# Patient Record
Sex: Female | Born: 1945 | Race: Black or African American | Hispanic: No | Marital: Married | Smoking: Former smoker
Health system: Southern US, Community
[De-identification: ages and names within clinical notes are randomized; demographics above are authoritative.]

## PROBLEM LIST (undated history)

## (undated) DIAGNOSIS — K5090 Crohn's disease, unspecified, without complications: Secondary | ICD-10-CM

## (undated) DIAGNOSIS — D631 Anemia in chronic kidney disease: Secondary | ICD-10-CM

## (undated) DIAGNOSIS — K529 Noninfective gastroenteritis and colitis, unspecified: Secondary | ICD-10-CM

## (undated) DIAGNOSIS — E890 Postprocedural hypothyroidism: Secondary | ICD-10-CM

## (undated) DIAGNOSIS — R030 Elevated blood-pressure reading, without diagnosis of hypertension: Secondary | ICD-10-CM

## (undated) DIAGNOSIS — R569 Unspecified convulsions: Secondary | ICD-10-CM

## (undated) DIAGNOSIS — Z8719 Personal history of other diseases of the digestive system: Secondary | ICD-10-CM

## (undated) DIAGNOSIS — Z8669 Personal history of other diseases of the nervous system and sense organs: Secondary | ICD-10-CM

## (undated) DIAGNOSIS — J439 Emphysema, unspecified: Secondary | ICD-10-CM

## (undated) DIAGNOSIS — I739 Peripheral vascular disease, unspecified: Secondary | ICD-10-CM

## (undated) DIAGNOSIS — K5010 Crohn's disease of large intestine without complications: Secondary | ICD-10-CM

## (undated) DIAGNOSIS — M1990 Unspecified osteoarthritis, unspecified site: Secondary | ICD-10-CM

## (undated) DIAGNOSIS — Z87442 Personal history of urinary calculi: Secondary | ICD-10-CM

## (undated) DIAGNOSIS — Z972 Presence of dental prosthetic device (complete) (partial): Secondary | ICD-10-CM

## (undated) DIAGNOSIS — I351 Nonrheumatic aortic (valve) insufficiency: Secondary | ICD-10-CM

## (undated) DIAGNOSIS — F419 Anxiety disorder, unspecified: Secondary | ICD-10-CM

## (undated) DIAGNOSIS — M810 Age-related osteoporosis without current pathological fracture: Secondary | ICD-10-CM

## (undated) DIAGNOSIS — I509 Heart failure, unspecified: Secondary | ICD-10-CM

## (undated) DIAGNOSIS — C801 Malignant (primary) neoplasm, unspecified: Secondary | ICD-10-CM

## (undated) DIAGNOSIS — IMO0002 Reserved for concepts with insufficient information to code with codable children: Secondary | ICD-10-CM

## (undated) DIAGNOSIS — N184 Chronic kidney disease, stage 4 (severe): Secondary | ICD-10-CM

## (undated) DIAGNOSIS — K219 Gastro-esophageal reflux disease without esophagitis: Secondary | ICD-10-CM

## (undated) DIAGNOSIS — I10 Essential (primary) hypertension: Secondary | ICD-10-CM

## (undated) DIAGNOSIS — Z9289 Personal history of other medical treatment: Secondary | ICD-10-CM

## (undated) DIAGNOSIS — K603 Anal fistula, unspecified: Secondary | ICD-10-CM

## (undated) DIAGNOSIS — N189 Chronic kidney disease, unspecified: Secondary | ICD-10-CM

## (undated) DIAGNOSIS — R0600 Dyspnea, unspecified: Secondary | ICD-10-CM

## (undated) DIAGNOSIS — M2550 Pain in unspecified joint: Secondary | ICD-10-CM

## (undated) DIAGNOSIS — I2699 Other pulmonary embolism without acute cor pulmonale: Secondary | ICD-10-CM

## (undated) DIAGNOSIS — I480 Paroxysmal atrial fibrillation: Secondary | ICD-10-CM

## (undated) HISTORY — DX: Nonrheumatic aortic (valve) insufficiency: I35.1

## (undated) HISTORY — DX: Age-related osteoporosis without current pathological fracture: M81.0

## (undated) HISTORY — PX: CHOLECYSTECTOMY: SHX55

## (undated) HISTORY — DX: Essential (primary) hypertension: I10

## (undated) HISTORY — DX: Emphysema, unspecified: J43.9

## (undated) HISTORY — PX: COLON RESECTION: SHX5231

## (undated) HISTORY — PX: EYE SURGERY: SHX253

## (undated) HISTORY — DX: Reserved for concepts with insufficient information to code with codable children: IMO0002

## (undated) HISTORY — PX: TUBAL LIGATION: SHX77

## (undated) HISTORY — PX: GLAUCOMA SURGERY: SHX656

---

## 1988-05-26 HISTORY — PX: ABDOMINAL HYSTERECTOMY: SHX81

## 1998-08-10 ENCOUNTER — Ambulatory Visit (HOSPITAL_COMMUNITY): Admission: RE | Admit: 1998-08-10 | Discharge: 1998-08-10 | Payer: Self-pay | Admitting: *Deleted

## 1998-09-18 ENCOUNTER — Ambulatory Visit (HOSPITAL_COMMUNITY): Admission: RE | Admit: 1998-09-18 | Discharge: 1998-09-18 | Payer: Self-pay | Admitting: Gastroenterology

## 1998-10-08 ENCOUNTER — Other Ambulatory Visit: Admission: RE | Admit: 1998-10-08 | Discharge: 1998-10-08 | Payer: Self-pay | Admitting: *Deleted

## 2000-01-30 ENCOUNTER — Ambulatory Visit (HOSPITAL_COMMUNITY): Admission: RE | Admit: 2000-01-30 | Discharge: 2000-01-30 | Payer: Self-pay | Admitting: Gastroenterology

## 2001-06-25 ENCOUNTER — Emergency Department (HOSPITAL_COMMUNITY): Admission: EM | Admit: 2001-06-25 | Discharge: 2001-06-25 | Payer: Self-pay

## 2001-07-09 ENCOUNTER — Encounter: Admission: RE | Admit: 2001-07-09 | Discharge: 2001-07-09 | Payer: Self-pay | Admitting: Gastroenterology

## 2001-07-09 ENCOUNTER — Encounter: Payer: Self-pay | Admitting: Gastroenterology

## 2003-06-06 ENCOUNTER — Other Ambulatory Visit: Admission: RE | Admit: 2003-06-06 | Discharge: 2003-06-06 | Payer: Self-pay | Admitting: Radiology

## 2003-07-18 ENCOUNTER — Ambulatory Visit (HOSPITAL_COMMUNITY): Admission: RE | Admit: 2003-07-18 | Discharge: 2003-07-18 | Payer: Self-pay | Admitting: Gastroenterology

## 2003-10-13 ENCOUNTER — Observation Stay (HOSPITAL_COMMUNITY): Admission: RE | Admit: 2003-10-13 | Discharge: 2003-10-14 | Payer: Self-pay | Admitting: Surgery

## 2003-10-13 ENCOUNTER — Encounter (INDEPENDENT_AMBULATORY_CARE_PROVIDER_SITE_OTHER): Payer: Self-pay | Admitting: Specialist

## 2003-10-13 HISTORY — PX: TOTAL THYROIDECTOMY: SHX2547

## 2005-02-28 ENCOUNTER — Emergency Department (HOSPITAL_COMMUNITY): Admission: EM | Admit: 2005-02-28 | Discharge: 2005-02-28 | Payer: Self-pay | Admitting: Emergency Medicine

## 2005-12-25 ENCOUNTER — Encounter: Admission: RE | Admit: 2005-12-25 | Discharge: 2005-12-25 | Payer: Self-pay | Admitting: Gastroenterology

## 2006-05-26 HISTORY — PX: OTHER SURGICAL HISTORY: SHX169

## 2006-07-05 ENCOUNTER — Encounter: Admission: RE | Admit: 2006-07-05 | Discharge: 2006-07-05 | Payer: Self-pay | Admitting: Gastroenterology

## 2007-03-13 ENCOUNTER — Inpatient Hospital Stay (HOSPITAL_COMMUNITY): Admission: EM | Admit: 2007-03-13 | Discharge: 2007-03-16 | Payer: Self-pay | Admitting: Emergency Medicine

## 2007-03-25 ENCOUNTER — Encounter: Admission: RE | Admit: 2007-03-25 | Discharge: 2007-03-25 | Payer: Self-pay | Admitting: Gastroenterology

## 2007-05-16 ENCOUNTER — Encounter: Admission: RE | Admit: 2007-05-16 | Discharge: 2007-05-16 | Payer: Self-pay | Admitting: Gastroenterology

## 2008-02-13 ENCOUNTER — Emergency Department (HOSPITAL_COMMUNITY): Admission: EM | Admit: 2008-02-13 | Discharge: 2008-02-13 | Payer: Self-pay | Admitting: Emergency Medicine

## 2008-08-09 ENCOUNTER — Encounter: Admission: RE | Admit: 2008-08-09 | Discharge: 2008-08-09 | Payer: Self-pay | Admitting: Gastroenterology

## 2008-08-15 ENCOUNTER — Encounter: Admission: RE | Admit: 2008-08-15 | Discharge: 2008-08-15 | Payer: Self-pay | Admitting: Gastroenterology

## 2008-10-10 ENCOUNTER — Emergency Department (HOSPITAL_COMMUNITY): Admission: EM | Admit: 2008-10-10 | Discharge: 2008-10-10 | Payer: Self-pay | Admitting: Emergency Medicine

## 2009-08-13 ENCOUNTER — Encounter: Admission: RE | Admit: 2009-08-13 | Discharge: 2009-08-13 | Payer: Self-pay | Admitting: Gastroenterology

## 2010-04-04 ENCOUNTER — Ambulatory Visit (HOSPITAL_COMMUNITY): Admission: RE | Admit: 2010-04-04 | Discharge: 2010-04-04 | Payer: Self-pay | Admitting: Gastroenterology

## 2010-10-08 NOTE — H&P (Signed)
NAMEMarland [name]  [name], [name] NO.:  [id number]   MEDICAL RECORD NO.:  [id number]          PATIENT TYPE:  INP   LOCATION:  1832                         FACILITY:  [facility]   PHYSICIAN:  Ashby Dawes. Polite, M.D. DATE OF BIRTH:  1946-03-06   DATE OF ADMISSION:  03/13/2007  DATE OF DISCHARGE:                              HISTORY & PHYSICAL   CHIEF COMPLAINT:  Nausea, vomiting, abdominal pain, and diarrhea.   HISTORY OF PRESENT ILLNESS:  This is a 65 year old female with known  history of hypertension, hypothyroidism, Crohn's disease, status post  multiple surgeries in 1978, 1987, and 1989, as well as glaucoma, chronic  prednisone use, who comes to the ED with the above chief complaint.  The  patient stated since Thursday she has been having chills, fever, poor  appetite, inability to keep anything down.  She has tried taking her  medicines, tried taking liquids, and was unable to keep it down, so M.D.  on Friday gave her antiemetics.  She was then able to keep the  antiemetic down.  She continued to have nausea, vomiting, and diarrhea.  She thinks she had about four bouts of diarrhea, no blood.  She denies  any sick contacts.  No recent antibiotics, no well water.  She has not  had a flare of her Crohn's in some time; however, when she has a flare  it typically presents like this.  In the ED she was evaluated.  The  patient was afebrile, hypotensive, blood pressure as low as 83/60, pulse  99, respiratory rate 20, saturating 100%.  The patient had labs, did not  have a white count.  In fact it was 8.6, hemoglobin 14, platelets 151.  BMET significant for hypokalemia at 2.7, creatinine 2.7.  UA appears to  be contaminated as there have been epithelial cells, 0-2 wbc's, few  bacteria, Hemoccult negative.  Carbon dioxide of 13.  Admission is  deemed necessary for further evaluation and treatment of hypotension,  metabolic acidosis, as well as acute renal failure.   PAST MEDICAL  HISTORY:  As stated above.   CURRENT MEDICATIONS:  1. Synthroid 112 mcg daily.  2. Benicar 20 mg daily.  3. Prednisone 5 mg daily.  4. Asacol six tablets daily.  5. Folic acid 10 mg daily.  6. Potassium 20 mEq daily.   SOCIAL HISTORY:  Negative for tobacco, alcohol, or drugs.   ALLERGIES:  None.   PAST SURGICAL HISTORY:  She states she had surgeries on her abdomen in  1978, 1987, and 1989.   FAMILY HISTORY:  Mother without any significant medical problems.  Father with heart trouble.  Four brothers, one deceased, unknown cause,  one with thyroid problems.   REVIEW OF SYSTEMS:  As stated in the HPI.   PHYSICAL EXAMINATION:  The patient was alert and oriented x3.  No  apparent distress.  VITAL SIGNS:  Temperature 98.7, blood pressure 89/57, pulse 100,  saturating 100%.  HEENT:  Slightly pale sclerae.  Moist oral mucosa.  No nose or JVD.  NECK:  No carotid bruit.  CHEST:  Clear without rales or rhonchi.  CARDIOVASCULAR:  Regular S1/S2.  ABDOMEN:  Slightly distended.  Vague tenderness with palpation.  No  hepatosplenomegaly appreciated.  EXTREMITIES:  No edema.  NEUROLOGIC:  Nonfocal.   ASSESSMENT:  1. Hypotension secondary to nausea, vomiting, and diarrhea.  2. Acute renal failure.  3. Hypokalemia.  4. Crohn's disease.  5. Chronic prednisone use.  6. History of hypertension.   RECOMMEND:  Recommend the patient be admitted to a telemetry floor bed.  The patient will be resuscitated with IV fluids.  Because of her history  of Crohn's she will be started on empiric antibiotics and stress  steroids.  Cortisol level will be obtained.  The patient will be pan  cultured.  Will replete potassium and make further recommendations after  review of the above studies.  Please note the patient did have abdominal  series that showed nonspecific bowel gas pattern with scattered air  fluid levels.  There is a short segment of one central bowel loop,  likely small bowel, that measures  4.7 cm and may be a focally dilated  small bowel loop.  No definite obstruction.      Ashby Dawes. Polite, M.D.  Electronically Signed     RDP/MEDQ  D:  03/13/2007  T:  03/13/2007  Job:  [id number]

## 2010-10-08 NOTE — Discharge Summary (Signed)
NAMEMarland [name]  ERNA, BROSSARD NO.:  [id number]   MEDICAL RECORD NO.:  [id number]          PATIENT TYPE:  INP   LOCATION:  1583                         FACILITY:  [facility]   PHYSICIAN:  Cletis Athens, M.D.   DATE OF BIRTH:  June 06, 1945   DATE OF ADMISSION:  03/13/2007  DATE OF DISCHARGE:  03/16/2007                               DISCHARGE SUMMARY   DISCHARGE DIAGNOSES:  1. Crohn's disease status post three resections of her terminal ileum      with a known recurrence and with an acute exacerbation presenting      as small-bowel obstruction.  2. Hypokalemia with dehydration.  3. Osteoporosis.  4. Goiter.  5. Hypertension, although that was not a problem since the patient was      hypotensive on this admission.   DISCHARGE MEDICATIONS:  1. The patient is having her Benicar 20 mg daily held.  2. She is on prednisone 40 mg daily which is a new higher dose.  3. Asacol 4 tablets twice a day.  4. Potassium 20 mEq 2 tablets twice a day which is a new higher dose.  5. Estrogen patch weekly.  6. Synthroid 0.112.  7. Actonel 35 mg weekly.   Her condition is improved.  Her diet is low residue. Her activity is up  ad lib, and her followup is early next week with me.   BRIEF HISTORY:  [name] developed gradual onset of nausea,  vomiting, chills, fever and abdominal pain over a period of several days  and was seen in the office by one of my partners and given an antiemetic  which did not work.  She presented to the emergency room Saturday night  with dehydration, hypotension and abdominal discomfort.  Physical exam  at that time revealed low blood pressure, abdomen with tenderness in the  right upper quadrant and diminished bowel sounds as well as otherwise  unremarkable heart, lung and extremity exam   LABORATORY DATA:  Initial potassium was 2.7 and remained low until we  were able to replete it, eventually rising to 3.9. BUN and creatinine  were also abnormal with BUN of  31, creatinine 2.76 and then corrected  back to entirely normal with IV fluids. Liver function was normal.  CBC  was normal and remained normal. Blood gas was normal, and urinalysis was  negative. Blood cultures were negative.  Fecal exam revealed no  abnormalities except for white cells and blood.   HOSPITAL COURSE:  CROHN'S DISEASE:  The patient underwent a two-way abdominal series  showing a sentinel loop in the center of the abdomen that was consistent  with partial small-bowel obstruction, but it was mild. As a result, she  was placed on IV fluids, IV Solu-Medrol and cultured and covered with  Cipro and Flagyl.  Within 24 hours, her abdominal findings had improved  considerably, and she was no longer having diarrhea, nausea or abdominal  pain.  KUB repeated showed improvement in the findings of partial small-  bowel obstruction.  She was switched to oral medications.  Her  antibiotics were stopped once the cultures came back  negative, and she  was placed on a low-residue diet which she tolerated without difficulty.  She is going to be discharged with the above medications.   Her other medical problems except for the hypokalemia were not  addressed. For hypotension, blood pressure medicine was stopped, and  this will be restarted as an outpatient hopefully next week.      Cletis Athens, M.D.  Electronically Signed     JW/MEDQ  D:  03/16/2007  T:  03/16/2007  Job:  [id number]

## 2010-10-11 NOTE — Op Note (Signed)
NAME:  Natasha [name], Natasha [name]                        ACCOUNT NO.:  [id number]   MEDICAL RECORD NO.:  [id number]                   PATIENT TYPE:  AMB   LOCATION:  DAY                                  FACILITY:  [facility]   PHYSICIAN:  Earnstine Regal, M.D.                DATE OF BIRTH:  1945/11/01   DATE OF PROCEDURE:  10/13/2003  DATE OF DISCHARGE:                                 OPERATIVE REPORT   PREOPERATIVE DIAGNOSIS:  Multinodular goiter with Hurthle cell change.   POSTOPERATIVE DIAGNOSIS:  Multinodular goiter with Hurthle cell change.   PROCEDURE:  Total thyroidectomy.   SURGEON:  Earnstine Regal, M.D.   ASSISTANT:  Lew Dawes. Rosana Hoes, M.D.   ANESTHESIA:  General.   ESTIMATED BLOOD LOSS:  Minimal.   PREPARATION:  Betadine.   COMPLICATIONS:  None.   INDICATIONS:  The patient is a pleasant 65 year old black female, presents  with multiple thyroid nodules.  These have been followed on physical exam by  Dr. Cletis Athens and previously by Dr. Irene Pap.  The patient  underwent fine needle aspiration in April 2005 of a solid nodule, measuring  2.8 cm in size.  Cytology demonstrated Hurthle cell change.  The patient has  multiple nodules in both the right and left lobes.  She now comes to surgery  for a total thyroidectomy.   DESCRIPTION OF PROCEDURE:  The procedure is done in OR #11 at the [facility].  The patient is brought to the operating room and  placed in a supine position on the operating room table.  Following the  administration of general anesthesia, the patient is prepped and draped in  the usual strict aseptic fashion.  After ascertaining that an adequate level  of anesthesia had been obtained, a Kocher incision is made with a #10 blade.  Dissection is carried down through subcutaneous tissues and platysma.  Hemostasis is obtained with the electrocautery.  Skin flaps are developed  cephalad and caudad from the thyroid notch to the sternal notch.   A Mahorner  self-retaining retractor is placed for exposure.  Strap muscles are incised  in the midline, and dissection is carried down to the thyroid gland.  There  are multiple moderate-sized nodules within the thyroid isthmus.  There is a  small pyramidal lobe.  Dissection is begun on the left side of the neck.  Strap muscles are reflected laterally.  Middle thyroid vein is divided  between small Ligaclips.  Gland is rolled anteriorly, and adventitial tissue  is bluntly dissected out with a Art therapist.  Superior pole is  mobilized.  Superior pole vessels are ligated in continuity with 2-0 silk  ties and medium Ligaclips and divided.  Inferior venous tributaries are  divided between small Ligaclips.  Gland is rolled further anteriorly.  Recurrent nerve is identified and preserved.  Branches of the inferior  thyroid artery are divided between small  Ligaclips.  Ligament of Gwenlyn Found is  transected with the electrocautery, and the gland is rolled up and onto the  anterior surface of the trachea.  A dry pack is placed in the left neck.  Pyramidal lobe is dissected out with the electrocautery and included with  the specimen.   Next, we turned our attention to the right thyroid lobe.  Again, strap  muscles were reflected laterally.  Middle thyroid vein was divided between  small Ligaclips.  The superior pole was dissected out, ligated in continuity  with 2-0 silk ties and medium Ligaclips, and divided.  Gland was rolled  anteriorly.  Branches of the inferior thyroid artery are divided between  small Ligaclips.  Inferior venous tributaries are divided between medium  Ligaclips.  Care is taken to preserve parathyroid tissue.  Ligament of Gwenlyn Found  is transected with the electrocautery, and the gland is excised off the  anterior trachea.  Good hemostasis is noted on both sides of the neck.  Neck  is irrigated with warm saline, and Surgicel is placed over the area of the  recurrent laryngeal  nerves.  A suture is used to mark the right superior  pole of the thyroid gland.  It is submitted to pathology for permanent  review.  Good hemostasis is noted on each side of the neck, and packs are  removed.  Strap muscles are reapproximated in the midline with interrupted 3-  0 Vicryl sutures.  Platysma is closed with interrupted 3-0 Vicryl sutures.  Skin edges are reapproximated with a running 4-0 Vicryl subcuticular suture.  The wound is washed and dried, and Benzoin and Steri-Strips area applied.  Sterile gauze dressings are applied.  The patient is awakened from  anesthesia and brought to the recovery room in stable condition.  The  patient tolerated the procedure well.                                               Earnstine Regal, M.D.    TMG/MEDQ  D:  10/13/2003  T:  10/13/2003  Job:  [id number]   cc:   Cletis Athens, M.D.  [place]  [address]  [place]  [place] [address]  Fax: [phone]   Jacelyn Pi, M.D.  [phone], [place] [address]  Fax: [phone]

## 2010-12-03 ENCOUNTER — Inpatient Hospital Stay (HOSPITAL_COMMUNITY)
Admission: EM | Admit: 2010-12-03 | Discharge: 2010-12-06 | DRG: 386 | Disposition: A | Discharge status: Home / Self Care | Payer: Medicare Other | Attending: Gastroenterology | Admitting: Gastroenterology

## 2010-12-03 ENCOUNTER — Emergency Department (HOSPITAL_COMMUNITY): Payer: Medicare Other

## 2010-12-03 DIAGNOSIS — IMO0002 Reserved for concepts with insufficient information to code with codable children: Secondary | ICD-10-CM

## 2010-12-03 DIAGNOSIS — E876 Hypokalemia: Secondary | ICD-10-CM | POA: Diagnosis present

## 2010-12-03 DIAGNOSIS — Z79899 Other long term (current) drug therapy: Secondary | ICD-10-CM

## 2010-12-03 DIAGNOSIS — K56609 Unspecified intestinal obstruction, unspecified as to partial versus complete obstruction: Secondary | ICD-10-CM | POA: Diagnosis present

## 2010-12-03 DIAGNOSIS — M810 Age-related osteoporosis without current pathological fracture: Secondary | ICD-10-CM | POA: Diagnosis present

## 2010-12-03 DIAGNOSIS — N183 Chronic kidney disease, stage 3 unspecified: Secondary | ICD-10-CM | POA: Diagnosis present

## 2010-12-03 DIAGNOSIS — E890 Postprocedural hypothyroidism: Secondary | ICD-10-CM | POA: Diagnosis present

## 2010-12-03 DIAGNOSIS — K5000 Crohn's disease of small intestine without complications: Principal | ICD-10-CM | POA: Diagnosis present

## 2010-12-03 DIAGNOSIS — M51379 Other intervertebral disc degeneration, lumbosacral region without mention of lumbar back pain or lower extremity pain: Secondary | ICD-10-CM | POA: Diagnosis present

## 2010-12-03 DIAGNOSIS — Z87891 Personal history of nicotine dependence: Secondary | ICD-10-CM

## 2010-12-03 DIAGNOSIS — M5137 Other intervertebral disc degeneration, lumbosacral region: Secondary | ICD-10-CM | POA: Diagnosis present

## 2010-12-03 DIAGNOSIS — M5030 Other cervical disc degeneration, unspecified cervical region: Secondary | ICD-10-CM | POA: Diagnosis present

## 2010-12-03 DIAGNOSIS — H409 Unspecified glaucoma: Secondary | ICD-10-CM | POA: Diagnosis present

## 2010-12-03 DIAGNOSIS — I129 Hypertensive chronic kidney disease with stage 1 through stage 4 chronic kidney disease, or unspecified chronic kidney disease: Secondary | ICD-10-CM | POA: Diagnosis present

## 2010-12-03 LAB — COMPREHENSIVE METABOLIC PANEL
AST: 13 U/L (ref 0–37)
BUN: 41 mg/dL — ABNORMAL HIGH (ref 6–23)
CO2: 16 mEq/L — ABNORMAL LOW (ref 19–32)
Chloride: 115 mEq/L — ABNORMAL HIGH (ref 96–112)
GFR calc Af Amer: 25 mL/min — ABNORMAL LOW (ref >60)
GFR calc non Af Amer: 21 mL/min — ABNORMAL LOW (ref >60)
Potassium: 2.9 mEq/L — ABNORMAL LOW (ref 3.5–5.1)
Total Bilirubin: 0.4 mg/dL (ref 0.3–1.2)
Total Protein: 6.6 g/dL (ref 6.0–8.3)

## 2010-12-03 LAB — DIFFERENTIAL
Basophils Absolute: 0 10*3/uL (ref 0.0–0.1)
Eosinophils Relative: 2 % (ref 0–5)
Lymphocytes Relative: 34 % (ref 12–46)
Monocytes Absolute: 0.6 10*3/uL (ref 0.1–1.0)
Monocytes Relative: 11 % (ref 3–12)
Neutro Abs: 3.1 10*3/uL (ref 1.7–7.7)
Neutrophils Relative %: 53 % (ref 43–77)

## 2010-12-03 LAB — CBC
HCT: 41 % (ref 36.0–46.0)
MCV: 92.6 fL (ref 78.0–100.0)
Platelets: 120 10*3/uL — ABNORMAL LOW (ref 150–400)
RBC: 4.43 MIL/uL (ref 3.87–5.11)
RDW: 14 % (ref 11.5–15.5)
WBC: 5.8 10*3/uL (ref 4.0–10.5)

## 2010-12-03 LAB — URINALYSIS, ROUTINE W REFLEX MICROSCOPIC
Bilirubin Urine: NEGATIVE
Glucose, UA: NEGATIVE mg/dL
Ketones, ur: NEGATIVE mg/dL
Nitrite: NEGATIVE
Specific Gravity, Urine: 1.017 (ref 1.005–1.030)
Urobilinogen, UA: 0.2 mg/dL (ref 0.0–1.0)

## 2010-12-03 LAB — LIPASE, BLOOD: Lipase: 121 U/L — ABNORMAL HIGH (ref 11–59)

## 2010-12-03 LAB — URINE MICROSCOPIC-ADD ON

## 2010-12-04 LAB — URINE CULTURE
Colony Count: NO GROWTH
Culture: NO GROWTH

## 2010-12-05 LAB — BASIC METABOLIC PANEL
BUN: 40 mg/dL — ABNORMAL HIGH (ref 6–23)
Calcium: 8.9 mg/dL (ref 8.4–10.5)
GFR calc Af Amer: 23 mL/min — ABNORMAL LOW (ref >60)
GFR calc non Af Amer: 19 mL/min — ABNORMAL LOW (ref >60)
Potassium: 3.8 mEq/L (ref 3.5–5.1)
Sodium: 138 mEq/L (ref 135–145)

## 2010-12-05 LAB — DIFFERENTIAL
Basophils Relative: 0 % (ref 0–1)
Eosinophils Absolute: 0 10*3/uL (ref 0.0–0.7)
Eosinophils Relative: 0 % (ref 0–5)
Lymphocytes Relative: 9 % — ABNORMAL LOW (ref 12–46)
Lymphs Abs: 0.6 10*3/uL — ABNORMAL LOW (ref 0.7–4.0)
Monocytes Relative: 2 % — ABNORMAL LOW (ref 3–12)
Neutrophils Relative %: 89 % — ABNORMAL HIGH (ref 43–77)

## 2010-12-05 LAB — CBC
HCT: 38.3 % (ref 36.0–46.0)
Hemoglobin: 12.8 g/dL (ref 12.0–15.0)
MCH: 31.4 pg (ref 26.0–34.0)
MCHC: 33.4 g/dL (ref 30.0–36.0)
MCV: 93.9 fL (ref 78.0–100.0)
Platelets: 118 10*3/uL — ABNORMAL LOW (ref 150–400)
RBC: 4.08 MIL/uL (ref 3.87–5.11)
WBC: 7.2 10*3/uL (ref 4.0–10.5)

## 2010-12-05 LAB — TSH: TSH: 0.041 u[IU]/mL — ABNORMAL LOW (ref 0.350–4.500)

## 2010-12-05 LAB — T4, FREE: Free T4: 1.54 ng/dL (ref 0.80–1.80)

## 2010-12-05 NOTE — H&P (Signed)
NAMEMarland Kitchen  OMAYA, NIELAND NO.:  [id number]  MEDICAL RECORD NO.:  [id number]  LOCATION:  5409                         FACILITY:  [facility]  PHYSICIAN:  Earle Gell, M.D.   DATE OF BIRTH:  1945-10-10  DATE OF ADMISSION:  12/03/2010 DATE OF DISCHARGE:                             HISTORY & PHYSICAL   ADMISSION DIAGNOSES: 1. Partial small-bowel obstruction and hypokalemia. 2. Chronic Crohn ileitis.  HISTORY:  [name] is a 65 year old female born Jul 27, 1945.  The patient has chronic Crohn ileitis.  She has undergone three ileal resections (total 60 cm small bowel removed) and a right colon resection in the past due to Crohn ileitis.  On April 04, 2010, proctocolonoscopy to the ileocolonic surgical anastomosis showed a normal colon and 4 anastomotic aphthous ulcers in the small bowel.  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 On May 16, 2007, she underwent an MRCP to evaluate chronic elevation in her serum amylase and lipase; MRCP showed probable pancreas divisum.  The patient has not felt well since Saturday, November 30, 2010.  She has had intermittent generalized abdominal pain, nausea without vomiting, nonbloody diarrhea, generalized fatigue, anorexia, and a poor taste.  The patient reported to [facility] emergency room this morning.  Her urinalysis showed proteinuria.  Serum lipase was slightly elevated at 121.  Complete metabolic profile was abnormal for a serum potassium 2.9, serum creatinine 2.34, alkaline phosphatase 174, and albumin 3.1.  White blood cell count was 5800, hemoglobin was 14.4 g, platelet count was low at 120,000.  Acute abdominal x-ray series showed a few dilated small bowel loops consistent with a partial small-bowel obstruction.  The patient chronically takes prednisone 5 mg daily.  She has degenerative disk disease involving the lumbar and cervical spine.  She takes Vicodin 5/500 mg  every 6 hours as needed.  She has chronic hypothyroidism post thyroidectomy for a large thyroid cyst.  The patient will be admitted and treated for a partial small-bowel obstruction.  She will receive intravenous Solu-Medrol and remain on a clear liquid diet.  I will screen her stool for C difficile toxin.  MEDICATION ALLERGIES:  None.  CHRONIC MEDICATIONS: 1. Vicodin 5/500 mg 1 tablet every 6 hours as needed. 2. Betimol ophthalmic suspension 1 drop in both eyes daily. 3. Vitamin B12 1000 mcg intramuscularly each month. 4. Folic acid 1 mg by mouth daily. 5. Asacol 4 tablets (1600 mg) daily. 6. Potassium chloride 20 mEq three times daily. 7. Synthroid 125 mcg daily. 8. Prednisone 5 mg daily.  PAST MEDICAL HISTORY: 1. Stage III kidney disease with proteinuria. 2. Primary hypothyroidism post thyroidectomy for thyroid cyst. 3. Hypertension. 4. Vitamin B12 deficiency. 5. 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 6. Chronic Crohn ileitis.  Three ileal resections and a right colon     resection between 1978 and 1989. 7. Osteoporosis. 8. Chronic respiratory allergies. 9. Glaucoma. 10.Lumbar and cervical degenerative disk disease. 11.Atypical chest pain. 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 13.Total abdominal hysterectomy with BSO. 14.Cholecystectomy. 15.Thyroidectomy.  HABITS:  The patient quit smoking cigarettes in 2003.  She continues to consume alcohol in moderation.  FAMILY HISTORY:  Brother diagnosed with colon cancer under the age  of 60.  IMMUNIZATIONS:  Pneumovax given in 2008, tetanus toxoid booster given in 2008, zoster vaccine given in 2008.  PHYSICAL EXAMINATION:  GENERAL:  The patient is alert and lying comfortably in her stretcher in the emergency department.  She reports no pain. HEENT:  Sclera nonicteric.  Mouth and throat appeared normal. LUNGS:  Clear to auscultation. CARDIAC:  A regular rhythm without audible murmurs. ABDOMEN:  Slightly distended with hypoactive bowel  sounds.  There is no tenderness to palpation in all quadrants. EXTREMITIES:  No edema.  ASSESSMENT: 1. Partial small-bowel obstruction by acute abdominal x-ray series. 2. Chronic Crohn ileitis. 3. Chronic prednisone use. 4. Chronic kidney disease with proteinuria and hypokalemia.          ______________________________ Earle Gell, M.D.     MJ/MEDQ  D:  12/03/2010  T:  12/04/2010  Job:  [id number]  cc:   Windy Kalata, M.D.  Electronically Signed by Earle Gell M.D. on 12/05/2010 03:56:25 PM

## 2010-12-08 NOTE — Discharge Summary (Signed)
NAMEMarland [name]  JOSEPHENE, MARRONE NO.:  [id number]  MEDICAL RECORD NO.:  [id number]  LOCATION:  1093                         FACILITY:  [facility]  PHYSICIAN:  Earle Gell, M.D.   DATE OF BIRTH:  Mar 14, 1946  DATE OF ADMISSION:  12/03/2010 DATE OF DISCHARGE:  12/06/2010                              DISCHARGE SUMMARY   DISCHARGE DIAGNOSIS:  Chronic Crohn ileitis with partial small bowel obstruction.  LABORATORY DATA:  Free T4 normal at 1.54 ng/dL.  Thyroid-stimulating hormone level 0.041 micro IU/mL.  Discharge white blood cell count 7200, discharge hemoglobin 12.8 g, discharge platelet count 118,000 on subcutaneous Lovenox.  Serum magnesium 1.6 mg/dL, discharge potassium 3.8, discharge creatinine 2.51.  Urine culture negative.  Stool screen for C difficile toxin negative.  Serum lipase slightly elevated 121. Admission complete metabolic profile was abnormal for serum potassium 2.9, serum creatinine 2.34, serum albumin 3.1.  Admission white blood cell count 5800, admission hemoglobin 14.4 g, admission platelet count 120,000.  Acute abdominal x-ray series on admission showed nonspecific bowel gas pattern with several dilated loops of small bowel which may indicate partial small bowel obstruction.  DISCHARGE MEDICATIONS: 1. Prednisone 40 mg each morning for 1 week, 35 mg each morning for 1     week, 30 mg each morning for 1 week, 25 mg each morning for 1 week,     20 mg each morning for 1 week, 15 mg each morning for 1 week, 10 mg     each morning week for 1 week, and finally 5 mg daily. 2. Flexeril 10 mg daily as needed. 3. Actonel 35 mg weekly. 4. Vitamin B12 - 1000 mcg intramuscularly each month. 5. Asacol 1600 mg each morning. 6. Potassium chloride 20 mEq three times daily. 7. Folic acid 1 mg daily. 8. Vicodin 5/500 one tablet every 6 hours as needed. 9. Synthroid 125 mcg daily. 10.Betimol ophthalmic suspension 1 drop in each eye daily.  OFFICE FOLLOWUP:  I will  plan to see Ms. Oesterle in the office in 1 month.  HOSPITAL COURSE:  Natasha [name] is a 65 year old female, born on 1945/11/03.  The patient has chronic Crohn ileitis.  She has undergone three ileal resections (a total of 60 cm ileum removed) and right colon resection in the past.  On April 04, 2010, colonoscopy to the ileal colonic surgical anastomosis showed a normal colon and for anastomotic aphthous ulcers in the distal ileum.  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 On May 16, 2007, MRCP showed pancreas divisum associated with chronic serum lipase and amylase elevation.  The patient was admitted to the hospital with intermittent generalized abdominal pain associated with nausea but no vomiting, nonbloody diarrhea, generalized fatigue, anorexia.  Acute abdominal x-ray series was consistent with a partial small bowel obstruction.  The patient was admitted to the hospital on a clear liquid diet and intravenous Solu-Medrol.  She was rapidly advanced from a clear liquid diet to a regular diet and switched from intravenous Solu-Medrol to oral prednisone.  She had no further bouts of abdominal pain.  Her diarrhea resolved.  Her abdominal pain resolved.  Screen for C difficile toxin was negative.  I presume the patient developed partial small bowel obstruction as a result of an exacerbation in her chronic Crohn ileitis.  She chronically takes prednisone 5 mg daily.  She will be discharged on her chronic medications.  Her dose of prednisone has been increased to 40 mg each morning and will be slowly tapered over the next few weeks to her baseline prednisone 5 mg daily dose.  The patient was due for a vitamin B12 injection and received that on the day of her discharge.  The patient is discharged in stable medical condition to home.          ______________________________ Earle Gell, M.D.     MJ/MEDQ  D:  12/06/2010  T:   12/06/2010  Job:  [id number]  Electronically Signed by Earle Gell M.D. on 12/08/2010 02:54:16 PM

## 2011-01-08 ENCOUNTER — Other Ambulatory Visit: Payer: Self-pay | Admitting: Gastroenterology

## 2011-01-09 ENCOUNTER — Ambulatory Visit
Admission: RE | Admit: 2011-01-09 | Discharge: 2011-01-09 | Disposition: A | Discharge status: Home / Self Care | Payer: Medicare Other | Source: Ambulatory Visit | Attending: Gastroenterology | Admitting: Gastroenterology

## 2011-01-23 ENCOUNTER — Ambulatory Visit (HOSPITAL_COMMUNITY)
Admission: RE | Admit: 2011-01-23 | Discharge: 2011-01-23 | Disposition: A | Discharge status: Home / Self Care | Payer: Medicare Other | Source: Ambulatory Visit | Attending: Gastroenterology | Admitting: Gastroenterology

## 2011-01-23 DIAGNOSIS — K219 Gastro-esophageal reflux disease without esophagitis: Secondary | ICD-10-CM | POA: Insufficient documentation

## 2011-01-23 DIAGNOSIS — I10 Essential (primary) hypertension: Secondary | ICD-10-CM | POA: Insufficient documentation

## 2011-01-23 DIAGNOSIS — K5090 Crohn's disease, unspecified, without complications: Secondary | ICD-10-CM | POA: Insufficient documentation

## 2011-01-23 DIAGNOSIS — E039 Hypothyroidism, unspecified: Secondary | ICD-10-CM | POA: Insufficient documentation

## 2011-01-23 DIAGNOSIS — R1310 Dysphagia, unspecified: Secondary | ICD-10-CM | POA: Insufficient documentation

## 2011-01-23 DIAGNOSIS — Z79899 Other long term (current) drug therapy: Secondary | ICD-10-CM | POA: Insufficient documentation

## 2011-02-02 NOTE — Op Note (Signed)
  NAME:  CYNDRA, FEINBERG NO.:  [id number]  MEDICAL RECORD NO.:  [id number]  LOCATION:  WLEN                         FACILITY:  [facility]  PHYSICIAN:  Earle Gell, M.D.   DATE OF BIRTH:  [birth year]  DATE OF PROCEDURE: DATE OF DISCHARGE:                              OPERATIVE REPORT   PROCEDURE:  Esophagogastroduodenoscopy.  HISTORY:  Ms. Aysiah Jurado. Montijo is a 65 year old female who chronically takes oral potassium and Actonel.  She is experiencing esophageal regurgitation with intermittent esophageal dysphagia without odynophagia.  She takes Nexium 40 mg daily.  Her barium esophagram with barium tablet was normal.  ENDOSCOPIST:  Earle Gell, M.D.  PREMEDICATION: 1. Versed 5 mg. 2. Fentanyl 50 mcg.  DESCRIPTION OF THE PROCEDURE:  The patient was placed in the left lateral decubitus position.  The Pentax gastroscope was passed through the posterior hypopharynx into the proximal esophagus without difficulty.  The hypopharynx, larynx, and vocal cords appeared normal.  Esophagoscopy:  The proximal mid and lower segments of the esophageal mucosa appeared normal.  The squamocolumnar junction was noted at 40 cm from the incisor teeth.  There was no endoscopic evidence for the presence of erosive esophagitis or Barrett esophagus.  Gastroscopy:  Retroflex view of the gastric cardia and fundus was normal.  The gastric body, antrum, and pylorus appeared normal.  Duodenoscopy:  The duodenal bulb and descending duodenum appeared normal.  ASSESSMENT:  Normal esophagogastroduodenoscopy.          ______________________________ Earle Gell, M.D.     MJ/MEDQ  D:  01/23/2011  T:  01/23/2011  Job:  [id number]  Electronically Signed by Earle Gell M.D. on 02/02/2011 09:13:15 AM

## 2011-02-24 LAB — COMPREHENSIVE METABOLIC PANEL
AST: 32
Albumin: 3.3 — ABNORMAL LOW
Alkaline Phosphatase: 134 — ABNORMAL HIGH
BUN: 35 — ABNORMAL HIGH
CO2: 22
Calcium: 8.4
Glucose, Bld: 75
Potassium: 3.7
Sodium: 144
Total Protein: 6.1

## 2011-02-24 LAB — DIFFERENTIAL
Lymphs Abs: 0.7
Monocytes Absolute: 0.2
Monocytes Relative: 1 — ABNORMAL LOW
Neutro Abs: 13.1 — ABNORMAL HIGH
Neutrophils Relative %: 93 — ABNORMAL HIGH

## 2011-02-24 LAB — URINALYSIS, ROUTINE W REFLEX MICROSCOPIC
Glucose, UA: NEGATIVE
Leukocytes, UA: NEGATIVE
Nitrite: NEGATIVE
Protein, ur: NEGATIVE
Specific Gravity, Urine: 1.013
pH: 5.5

## 2011-02-24 LAB — CBC
HCT: 40.9
Hemoglobin: 13.6
MCHC: 33.1
Platelets: 145 — ABNORMAL LOW
RBC: 4.19
RDW: 14
WBC: 14.1 — ABNORMAL HIGH

## 2011-02-24 LAB — URINE MICROSCOPIC-ADD ON

## 2011-03-05 LAB — GIARDIA/CRYPTOSPORIDIUM SCREEN(EIA)
Cryptosporidium Screen (EIA): NEGATIVE
Giardia Screen - EIA: NEGATIVE

## 2011-03-05 LAB — BASIC METABOLIC PANEL WITH GFR
BUN: 13
CO2: 12 — ABNORMAL LOW
Calcium: 8.4
Chloride: 117 — ABNORMAL HIGH
Chloride: 127 — ABNORMAL HIGH
Creatinine, Ser: 2.55 — ABNORMAL HIGH
GFR calc Af Amer: 25 — ABNORMAL LOW
GFR calc non Af Amer: 19 — ABNORMAL LOW
Glucose, Bld: 134 — ABNORMAL HIGH
Potassium: 3.1 — ABNORMAL LOW
Potassium: 3.9
Sodium: 141
Sodium: 144

## 2011-03-05 LAB — I-STAT 8, (EC8 V) (CONVERTED LAB)
Acid-base deficit: 16 — ABNORMAL HIGH
BUN: 31 — ABNORMAL HIGH
Chloride: 118 — ABNORMAL HIGH
Glucose, Bld: 106 — ABNORMAL HIGH
Potassium: 2.9 — ABNORMAL LOW
TCO2: 12
pCO2, Ven: 29.2 — ABNORMAL LOW
pH, Ven: 7.192 — CL

## 2011-03-05 LAB — URINE MICROSCOPIC-ADD ON

## 2011-03-05 LAB — FECAL LACTOFERRIN, QUANT

## 2011-03-05 LAB — COMPREHENSIVE METABOLIC PANEL
AST: 24
Albumin: 3.4 — ABNORMAL LOW
Alkaline Phosphatase: 116
Alkaline Phosphatase: 91
BUN: 27 — ABNORMAL HIGH
BUN: 31 — ABNORMAL HIGH
Creatinine, Ser: 2.47 — ABNORMAL HIGH
GFR calc Af Amer: 21 — ABNORMAL LOW
Glucose, Bld: 89
Potassium: 2.7 — CL
Potassium: 3.2 — ABNORMAL LOW
Total Bilirubin: 0.6
Total Protein: 5.4 — ABNORMAL LOW
Total Protein: 7.2

## 2011-03-05 LAB — MAGNESIUM: Magnesium: 1.2 — ABNORMAL LOW

## 2011-03-05 LAB — BASIC METABOLIC PANEL
BUN: 17
BUN: 30 — ABNORMAL HIGH
CO2: 12 — ABNORMAL LOW
CO2: 13 — ABNORMAL LOW
Calcium: 7.7 — ABNORMAL LOW
Calcium: 8.2 — ABNORMAL LOW
Chloride: 124 — ABNORMAL HIGH
Creatinine, Ser: 2.37 — ABNORMAL HIGH
Creatinine, Ser: 2.48 — ABNORMAL HIGH
GFR calc Af Amer: 24 — ABNORMAL LOW
GFR calc non Af Amer: 21 — ABNORMAL LOW
Glucose, Bld: 103 — ABNORMAL HIGH
Sodium: 143

## 2011-03-05 LAB — CBC
HCT: 34.4 — ABNORMAL LOW
HCT: 43
Hemoglobin: 11.6 — ABNORMAL LOW
MCHC: 33.6
Platelets: 151
RBC: 3.59 — ABNORMAL LOW
RDW: 14.6 — ABNORMAL HIGH
RDW: 14.9 — ABNORMAL HIGH
WBC: 8.6

## 2011-03-05 LAB — DIFFERENTIAL
Basophils Absolute: 0
Basophils Relative: 0
Eosinophils Absolute: 0.1
Eosinophils Relative: 1
Lymphocytes Relative: 18
Monocytes Absolute: 0.9 — ABNORMAL HIGH
Monocytes Relative: 11
Neutro Abs: 6

## 2011-03-05 LAB — CLOSTRIDIUM DIFFICILE EIA

## 2011-03-05 LAB — OCCULT BLOOD X 1 CARD TO LAB, STOOL: Fecal Occult Bld: NEGATIVE

## 2011-03-05 LAB — URINALYSIS, ROUTINE W REFLEX MICROSCOPIC
Glucose, UA: NEGATIVE
Leukocytes, UA: NEGATIVE
Specific Gravity, Urine: 1.015

## 2011-03-05 LAB — POTASSIUM: Potassium: 3.2 — ABNORMAL LOW

## 2011-03-05 LAB — CORTISOL: Cortisol, Plasma: 12.6

## 2011-03-05 LAB — LACTIC ACID, PLASMA: Lactic Acid, Venous: 1.6

## 2012-03-03 NOTE — H&P (Signed)
  Problems: Epigastric pain. Melenic-appearing stool. Crohn's ileitis. Pancreas divisum. Anorexia. Unintentional weight loss. Diarrhea.  History: The patient is a 66 year old female born [birth year]. 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 In August 2012, the patient underwent a normal esophagogastroduodenoscopy. In November 2011, the patient underwent a normal colonoscopy to the ileo--right colonic surgical anastomosis except for the presence of 4 small aphthous ulcers in the distal ileum.  A few weeks ago, the patient was diagnosed with acute bronchitis and placed on a course of erythromycin and tapering prednisone therapy. Her respiratory symptoms resolved.  For approximately 2 weeks, the patient has experienced postprandial epigastric discomfort, anorexia, postprandial nausea without vomiting, and postprandial watery, nonbloody diarrhea. The patient has experienced unintentional weight loss due to her poor appetite. She had an episode of passing melenic appearing stool which resolved. There is no past history of peptic ulcer disease. She does take omeprazole on a daily basis. 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  The patient is scheduled to undergo a diagnostic esophagogastroduodenoscopy and flexible proctosigmoidoscopy.  Chronic medication: Actonel. Prednisone. Asacol. Flexeril. Calcium. Folic acid. Betimol eyedrops. Omeprazole. Synthroid. Parenteral vitamin B12.  Past medical and surgical history: Crohn's ileitis.  Three  terminal ileal resections and right colon resection. Osteoporosis. Prominent thyroid cyst. Hypertension. Plantar warts. Chronic respiratory allergies. Pancreas divisum. Postmenopausal. Glaucoma. Brother diagnosed with colon cancer under age 62. Lumbar disc disease. Atypical chest pain. 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  Distal ileum and right colon resection. Appendectomy. Total abdominal hysterectomy. Cholecystectomy. Thyroidectomy.  Medication allergies: Mercaptopurine caused pancreatitis. Remicade caused  joint pains.  Habits: The patient is a former cigarette smoker and quit smoking cigarettes in 2003. She consumes alcohol in moderation.  Plan: Proceed with diagnostic esophagogastroduodenoscopy and flexible proctosigmoidoscopy.

## 2012-03-04 ENCOUNTER — Encounter (HOSPITAL_COMMUNITY)
Admission: RE | Disposition: A | Discharge status: Home / Self Care | Payer: Self-pay | Source: Ambulatory Visit | Attending: Gastroenterology

## 2012-03-04 ENCOUNTER — Encounter (HOSPITAL_COMMUNITY): Payer: Self-pay

## 2012-03-04 ENCOUNTER — Ambulatory Visit (HOSPITAL_COMMUNITY)
Admission: RE | Admit: 2012-03-04 | Discharge: 2012-03-04 | Disposition: A | Discharge status: Home / Self Care | Payer: Medicare Other | Source: Ambulatory Visit | Attending: Gastroenterology | Admitting: Gastroenterology

## 2012-03-04 DIAGNOSIS — K5000 Crohn's disease of small intestine without complications: Secondary | ICD-10-CM | POA: Insufficient documentation

## 2012-03-04 DIAGNOSIS — E876 Hypokalemia: Secondary | ICD-10-CM

## 2012-03-04 DIAGNOSIS — I129 Hypertensive chronic kidney disease with stage 1 through stage 4 chronic kidney disease, or unspecified chronic kidney disease: Secondary | ICD-10-CM | POA: Insufficient documentation

## 2012-03-04 DIAGNOSIS — K269 Duodenal ulcer, unspecified as acute or chronic, without hemorrhage or perforation: Secondary | ICD-10-CM | POA: Insufficient documentation

## 2012-03-04 DIAGNOSIS — Z79899 Other long term (current) drug therapy: Secondary | ICD-10-CM | POA: Insufficient documentation

## 2012-03-04 DIAGNOSIS — N184 Chronic kidney disease, stage 4 (severe): Secondary | ICD-10-CM | POA: Insufficient documentation

## 2012-03-04 HISTORY — DX: Gastro-esophageal reflux disease without esophagitis: K21.9

## 2012-03-04 HISTORY — PX: FLEXIBLE SIGMOIDOSCOPY: SHX5431

## 2012-03-04 HISTORY — PX: ESOPHAGOGASTRODUODENOSCOPY: SHX5428

## 2012-03-04 SURGERY — EGD (ESOPHAGOGASTRODUODENOSCOPY)
Anesthesia: Moderate Sedation

## 2012-03-04 MED ORDER — FENTANYL CITRATE 0.05 MG/ML IJ SOLN
INTRAMUSCULAR | Status: DC | PRN
  Administered 2012-03-04 (×2): 25 ug via INTRAVENOUS

## 2012-03-04 MED ORDER — SODIUM CHLORIDE 0.9 % IV SOLN
INTRAVENOUS | Status: DC
  Administered 2012-03-04: 500 mL via INTRAVENOUS

## 2012-03-04 MED ORDER — BUTAMBEN-TETRACAINE-BENZOCAINE 2-2-14 % EX AERO
INHALATION_SPRAY | CUTANEOUS | Status: DC | PRN
  Administered 2012-03-04: 2 via TOPICAL

## 2012-03-04 MED ORDER — MIDAZOLAM HCL 10 MG/2ML IJ SOLN
INTRAMUSCULAR | Status: AC
  Filled 2012-03-04: qty 4

## 2012-03-04 MED ORDER — MIDAZOLAM HCL 10 MG/2ML IJ SOLN
INTRAMUSCULAR | Status: DC | PRN
  Administered 2012-03-04: 1 mg via INTRAVENOUS
  Administered 2012-03-04 (×2): 2 mg via INTRAVENOUS

## 2012-03-04 MED ORDER — FENTANYL CITRATE 0.05 MG/ML IJ SOLN
INTRAMUSCULAR | Status: AC
  Filled 2012-03-04: qty 4

## 2012-03-04 NOTE — Op Note (Signed)
Procedure: Diagnostic esophagogastroduodenoscopy.  Endoscopist: Earle Gell  Premedication: Versed 5 mg intravenously. Fentanyl 50 mcg intravenously.  Procedure: The patient was placed in the left lateral decubitus position. The Pentax gastroscope was passed through the posterior hypopharynx into the proximal esophagus without difficulty. The hypopharynx, larynx, and vocal cords appeared normal.  Esophagoscopy: The proximal, mid, and lower segments of the esophageal mucosa appear normal. The squamocolumnar junction is noted at 40 cm from the incisor teeth.  Gastroscopy: Retroflex view of the gastric cardia and fundus was normal. The gastric body, antrum, and pylorus appeared normal.  Duodenoscopy: There are scattered superficial erosions without bleeding in the duodenal bulb. The descending duodenum appeared normal.  Assessment: Esophagogastroduodenoscopy showed multiple small erosions in the duodenal bulb without gastrointestinal bleeding. Otherwise the esophagogastroduodenoscopy was normal.  Procedure: Diagnostic flexible proctosigmoidoscopy performed to 70 cm with random colonic biopsies.  Procedure: The patient was placed in the left lateral decubitus position. Anal inspection and digital rectal exam were normal. The Pentax gastroscope was introduced into the rectum and advanced to approximately 70 cm from the anal verge. Colonic preparation for the exam today was. Endoscopic appearance of the rectal and left colonic mucosa was normal. Random colon biopsies were performed from the descending colon and sigmoid colon to look for signs of microscopic colitis.  Assessment: Normal flexible proctosigmoidoscopy to 70 cm. Random colon biopsies to look for microscopic colitis pending.

## 2012-03-07 ENCOUNTER — Emergency Department (HOSPITAL_COMMUNITY)
Admission: EM | Admit: 2012-03-07 | Discharge: 2012-03-07 | Disposition: A | Discharge status: Home / Self Care | Payer: Medicare Other | Attending: Emergency Medicine | Admitting: Emergency Medicine

## 2012-03-07 ENCOUNTER — Emergency Department (HOSPITAL_COMMUNITY): Payer: Medicare Other

## 2012-03-07 ENCOUNTER — Encounter (HOSPITAL_COMMUNITY): Payer: Self-pay

## 2012-03-07 DIAGNOSIS — M79602 Pain in left arm: Secondary | ICD-10-CM

## 2012-03-07 DIAGNOSIS — N289 Disorder of kidney and ureter, unspecified: Secondary | ICD-10-CM

## 2012-03-07 DIAGNOSIS — M545 Low back pain, unspecified: Secondary | ICD-10-CM | POA: Insufficient documentation

## 2012-03-07 DIAGNOSIS — E876 Hypokalemia: Secondary | ICD-10-CM | POA: Insufficient documentation

## 2012-03-07 DIAGNOSIS — M79609 Pain in unspecified limb: Secondary | ICD-10-CM | POA: Insufficient documentation

## 2012-03-07 DIAGNOSIS — Z79899 Other long term (current) drug therapy: Secondary | ICD-10-CM | POA: Insufficient documentation

## 2012-03-07 DIAGNOSIS — N189 Chronic kidney disease, unspecified: Secondary | ICD-10-CM | POA: Insufficient documentation

## 2012-03-07 LAB — COMPREHENSIVE METABOLIC PANEL
Albumin: 3.4 g/dL — ABNORMAL LOW (ref 3.5–5.2)
BUN: 37 mg/dL — ABNORMAL HIGH (ref 6–23)
Calcium: 9.4 mg/dL (ref 8.4–10.5)
Creatinine, Ser: 3.21 mg/dL — ABNORMAL HIGH (ref 0.50–1.10)
Total Protein: 7.1 g/dL (ref 6.0–8.3)

## 2012-03-07 LAB — CBC WITH DIFFERENTIAL/PLATELET
Basophils Relative: 0 % (ref 0–1)
Eosinophils Absolute: 0.2 10*3/uL (ref 0.0–0.7)
Eosinophils Relative: 2 % (ref 0–5)
HCT: 41.1 % (ref 36.0–46.0)
Hemoglobin: 13.9 g/dL (ref 12.0–15.0)
Lymphs Abs: 2.6 10*3/uL (ref 0.7–4.0)
MCH: 32.3 pg (ref 26.0–34.0)
MCHC: 33.8 g/dL (ref 30.0–36.0)
MCV: 95.6 fL (ref 78.0–100.0)
Monocytes Absolute: 0.5 10*3/uL (ref 0.1–1.0)
Monocytes Relative: 7 % (ref 3–12)
Neutrophils Relative %: 55 % (ref 43–77)
RBC: 4.3 MIL/uL (ref 3.87–5.11)
RDW: 15 % (ref 11.5–15.5)

## 2012-03-07 LAB — LIPASE, BLOOD: Lipase: 121 U/L — ABNORMAL HIGH (ref 11–59)

## 2012-03-07 MED ORDER — OXYCODONE-ACETAMINOPHEN 5-325 MG PO TABS: 2.0000 | ORAL_TABLET | ORAL | Status: DC | PRN

## 2012-03-07 MED ORDER — DIAZEPAM 5 MG PO TABS
5.0000 mg | ORAL_TABLET | Freq: Once | ORAL | Status: AC
  Administered 2012-03-07: 5 mg via ORAL
  Filled 2012-03-07: qty 1

## 2012-03-07 MED ORDER — DIAZEPAM 5 MG PO TABS: 5.0000 mg | ORAL_TABLET | Freq: Three times a day (TID) | ORAL | Status: DC | PRN

## 2012-03-07 MED ORDER — OXYCODONE-ACETAMINOPHEN 5-325 MG PO TABS
1.0000 | ORAL_TABLET | Freq: Once | ORAL | Status: AC
  Administered 2012-03-07: 1 via ORAL
  Filled 2012-03-07: qty 1

## 2012-03-07 NOTE — ED Provider Notes (Cosign Needed)
History     CSN: [id number]  Arrival date & time 03/07/12  7209   First MD Initiated Contact with Patient 03/07/12 0454      Chief Complaint  Patient presents with  . Arm Pain    (Consider location/radiation/quality/duration/timing/severity/associated sxs/prior treatment) HPI 66 year old female presents to emergency department with complaint of left arm pain. She reports pain woke her from sleep around 3 AM. Pain improves with massage, but then comes back. Pain begins in her left shoulder and is only down to her fingertips. She reports some numbness and tingling to her fingertips. No trauma or overuse injury reported to the left arm. No prior history of same. Pain is a deep seated throbbing ache. Denies any shortness of breath, no chest pain no abdominal pain. She reports she was dizzy and nauseous when she woke at 3 AM. Patient had some low back pain 2 days ago, reports she had a negative urinalysis. Patient had upper and lower endoscopies done on Thursday as patient has had some ongoing nausea and concern for possible pancreas problems, has history of Crohn's. She reports the endoscopist and these were negative but she has not been feeling well since having them done. She did report some abdominal bloating but denies any pain. Past Medical History  Diagnosis Date  . Chronic kidney disease   . GERD (gastroesophageal reflux disease)   . Crohn's colitis     Past Surgical History  Procedure Date  . Colon resection     History reviewed. No pertinent family history.  History  Substance Use Topics  . Smoking status: Current Every Day Smoker -- 0.2 packs/day  . Smokeless tobacco: Not on file  . Alcohol Use: 1.2 oz/week    2 Glasses of wine per week    OB History    Grav Para Term Preterm Abortions TAB SAB Ect Mult Living                  Review of Systems  All other systems reviewed and are negative.    Allergies  Mercaptopurine and Remicade  Home Medications    Current Outpatient Rx  Name Route Sig Dispense Refill  . CYCLOBENZAPRINE HCL 10 MG PO TABS Oral Take 10 mg by mouth 3 (three) times daily as needed.    [name] FOLIC ACID 1 MG PO TABS Oral Take 1 mg by mouth daily.    [name] LEVOTHYROXINE SODIUM 125 MCG PO TABS Oral Take 125 mcg by mouth daily.    [name] MESALAMINE 400 MG PO TBEC Oral Take 400 mg by mouth 2 (two) times daily.    [name] OMEPRAZOLE 40 MG PO CPDR Oral Take 40 mg by mouth daily.    [name] PREDNISONE 5 MG PO TABS Oral Take 5 mg by mouth daily.    [name] RISEDRONATE SODIUM 35 MG PO TABS Oral Take 35 mg by mouth every 7 (seven) days. with water on empty stomach, nothing by mouth or lie down for next 30 minutes.    [name] TIMOLOL HEMIHYDRATE 0.25 % OP SOLN  1-2 drops 2 (two) times daily.    [name] VITAMIN B-12 1000 MCG PO TABS Oral Take 1,000 mcg by mouth daily.      BP 126/78  Temp 97.4 F (36.3 C) (Oral)  Resp 18  SpO2 100%  Physical Exam  Nursing note and vitals reviewed. Constitutional: She is oriented to person, place, and time. She appears well-developed and well-nourished.  HENT:  Head: Normocephalic and atraumatic.  Nose: Nose normal.  Mouth/Throat: Oropharynx is clear and moist.  Eyes: Conjunctivae normal and EOM are normal. Pupils are equal, round, and reactive to light.  Neck: Normal range of motion. Neck supple. No JVD present. No tracheal deviation present. No thyromegaly present.  Cardiovascular: Normal rate, regular rhythm, normal heart sounds and intact distal pulses.  Exam reveals no gallop and no friction rub.   No murmur heard. Pulmonary/Chest: Effort normal and breath sounds normal. No stridor. No respiratory distress. She has no wheezes. She has no rales. She exhibits no tenderness.  Abdominal: Soft. She exhibits distension. She exhibits no mass. There is tenderness. There is no rebound and no guarding.       Increased bowel sounds, diffuse mild tenderness to palpation with some mild distention noted  Musculoskeletal: Normal range of  motion. She exhibits no edema and no tenderness.  Lymphadenopathy:    She has no cervical adenopathy.  Neurological: She is alert and oriented to person, place, and time. She exhibits normal muscle tone. Coordination normal.  Skin: Skin is dry. No rash noted. No erythema. No pallor.  Psychiatric: She has a normal mood and affect. Her behavior is normal. Judgment and thought content normal.    ED Course  Procedures (including critical care time)  Labs Reviewed  CBC WITH DIFFERENTIAL - Abnormal; Notable for the following:    Platelets 113 (*)  PLATELET COUNT CONFIRMED BY SMEAR   All other components within normal limits  COMPREHENSIVE METABOLIC PANEL - Abnormal; Notable for the following:    Potassium 3.1 (*)     Chloride 117 (*)     CO2 13 (*)     BUN 37 (*)     Creatinine, Ser 3.21 (*)     Albumin 3.4 (*)     Alkaline Phosphatase 186 (*)     GFR calc non Af Amer 14 (*)     GFR calc Af Amer 16 (*)     All other components within normal limits  LIPASE, BLOOD - Abnormal; Notable for the following:    Lipase 121 (*)     All other components within normal limits    Troponin I stat I 0.01  Dg Chest 2 View  03/07/2012  *RADIOLOGY REPORT*  Clinical Data: Chest pain  CHEST - 2 VIEW  Comparison: 10/12/2003  Findings: Lungs are essentially clear. No pleural effusion or pneumothorax.  Cardiomediastinal silhouette is within normal limits.  Visualized osseous structures are within normal limits.  IMPRESSION: No evidence of acute cardiopulmonary disease.   Original Report Authenticated By: Julian Hy, M.D.    Dg Abd 1 View  03/07/2012  *RADIOLOGY REPORT*  Clinical Data: Abdominal pain, bloating  ABDOMEN - 1 VIEW  Comparison: CT abdomen pelvis dated 12/26/2011  Findings: Nonobstructive bowel gas pattern.  No evidence of free air under the diaphragm on this upright view, noting that the extreme right hemidiaphragm is excluded.  Cholecystectomy clips.  Surgical sutures overlying the  mid/lower abdomen.  IMPRESSION: No evidence of bowel obstruction.  No evidence of free air, noting that the extreme right hemidiaphragm is excluded.   Original Report Authenticated By: Julian Hy, M.D.      Date: 03/07/2012  Rate: 88  Rhythm: normal sinus rhythm  QRS Axis: normal  Intervals: normal  ST/T Wave abnormalities: normal  Conduction Disutrbances:none  Narrative Interpretation:   Old EKG Reviewed: unchanged   1. Left arm pain   2. Lower back pain   3. Renal insufficiency   4. Hypokalemia  MDM  66 year old female with left arm pain also with some recent back pain after upper and lower GI. We'll check baseline labs as well as troponin. EKG is normal. We'll get abdomen and chest x-rays.  7:52 AM He should had some improvement in pain after Percocet, but reports back pain is now increasing as well as arm pain. No signs of cardiac involvement causing her arm pain. Pain appears to be musculoskeletal, but may also have speech secondary to nerve impingement. Patient noted to have worsening renal insufficiency. Discussed with on-call primary care Dr., Dr. Laurann Montana who recommends close followup with her nephrologist and primary care Dr. Wynetta Emery and  pain management        Kalman Drape, MD 03/07/12 [place], MD 03/07/12 [phone]

## 2012-03-07 NOTE — ED Notes (Signed)
MD at bedside. 

## 2012-03-07 NOTE — ED Notes (Addendum)
Pt reports waking w/(L) arm tightness/pressure, nauseous, and dizziness. Pt reports having a upper and lower GI scan on Thursday, developing lower back pain on Friday. Pt denies chest/abd pain, diaphoresis, or V/D. Pt also reports intermittent cough and chest congestion startng in the summer. Pt reports (L) hand numbness.  No neuro deficits noted, grips and strengths equal bilaterally, no facial or arm droop, or slurred speech noted

## 2012-03-08 ENCOUNTER — Encounter (HOSPITAL_COMMUNITY): Payer: Self-pay | Admitting: Gastroenterology

## 2012-03-08 LAB — POCT I-STAT TROPONIN I: Troponin i, poc: 0.01 ng/mL (ref 0.00–0.08)

## 2012-03-11 ENCOUNTER — Other Ambulatory Visit: Payer: Self-pay | Admitting: Gastroenterology

## 2012-03-11 DIAGNOSIS — K5000 Crohn's disease of small intestine without complications: Secondary | ICD-10-CM

## 2012-03-18 ENCOUNTER — Ambulatory Visit
Admission: RE | Admit: 2012-03-18 | Discharge: 2012-03-18 | Disposition: A | Discharge status: Home / Self Care | Payer: Medicare Other | Source: Ambulatory Visit | Attending: Gastroenterology | Admitting: Gastroenterology

## 2012-03-18 DIAGNOSIS — K5000 Crohn's disease of small intestine without complications: Secondary | ICD-10-CM

## 2012-06-03 ENCOUNTER — Emergency Department (HOSPITAL_COMMUNITY): Payer: Medicare Other

## 2012-06-03 ENCOUNTER — Emergency Department (HOSPITAL_COMMUNITY)
Admission: EM | Admit: 2012-06-03 | Discharge: 2012-06-04 | Disposition: A | Discharge status: Home / Self Care | Payer: Medicare Other | Attending: Emergency Medicine | Admitting: Emergency Medicine

## 2012-06-03 ENCOUNTER — Encounter (HOSPITAL_COMMUNITY): Payer: Self-pay | Admitting: Cardiology

## 2012-06-03 DIAGNOSIS — R05 Cough: Secondary | ICD-10-CM | POA: Insufficient documentation

## 2012-06-03 DIAGNOSIS — IMO0001 Reserved for inherently not codable concepts without codable children: Secondary | ICD-10-CM | POA: Insufficient documentation

## 2012-06-03 DIAGNOSIS — R059 Cough, unspecified: Secondary | ICD-10-CM | POA: Insufficient documentation

## 2012-06-03 DIAGNOSIS — Z9049 Acquired absence of other specified parts of digestive tract: Secondary | ICD-10-CM | POA: Insufficient documentation

## 2012-06-03 DIAGNOSIS — K219 Gastro-esophageal reflux disease without esophagitis: Secondary | ICD-10-CM | POA: Insufficient documentation

## 2012-06-03 DIAGNOSIS — E860 Dehydration: Secondary | ICD-10-CM | POA: Insufficient documentation

## 2012-06-03 DIAGNOSIS — R509 Fever, unspecified: Secondary | ICD-10-CM | POA: Insufficient documentation

## 2012-06-03 DIAGNOSIS — N189 Chronic kidney disease, unspecified: Secondary | ICD-10-CM | POA: Insufficient documentation

## 2012-06-03 DIAGNOSIS — E876 Hypokalemia: Secondary | ICD-10-CM | POA: Insufficient documentation

## 2012-06-03 DIAGNOSIS — Z9889 Other specified postprocedural states: Secondary | ICD-10-CM | POA: Insufficient documentation

## 2012-06-03 DIAGNOSIS — R5381 Other malaise: Secondary | ICD-10-CM | POA: Insufficient documentation

## 2012-06-03 DIAGNOSIS — Z79899 Other long term (current) drug therapy: Secondary | ICD-10-CM | POA: Insufficient documentation

## 2012-06-03 DIAGNOSIS — R112 Nausea with vomiting, unspecified: Secondary | ICD-10-CM

## 2012-06-03 DIAGNOSIS — J3489 Other specified disorders of nose and nasal sinuses: Secondary | ICD-10-CM | POA: Insufficient documentation

## 2012-06-03 DIAGNOSIS — Z8719 Personal history of other diseases of the digestive system: Secondary | ICD-10-CM | POA: Insufficient documentation

## 2012-06-03 DIAGNOSIS — R000 Tachycardia, unspecified: Secondary | ICD-10-CM | POA: Insufficient documentation

## 2012-06-03 DIAGNOSIS — F17200 Nicotine dependence, unspecified, uncomplicated: Secondary | ICD-10-CM | POA: Insufficient documentation

## 2012-06-03 DIAGNOSIS — J111 Influenza due to unidentified influenza virus with other respiratory manifestations: Secondary | ICD-10-CM | POA: Insufficient documentation

## 2012-06-03 LAB — COMPREHENSIVE METABOLIC PANEL
ALT: 8 U/L (ref 0–35)
AST: 15 U/L (ref 0–37)
Albumin: 3.3 g/dL — ABNORMAL LOW (ref 3.5–5.2)
Alkaline Phosphatase: 185 U/L — ABNORMAL HIGH (ref 39–117)
CO2: 18 mEq/L — ABNORMAL LOW (ref 19–32)
Chloride: 107 mEq/L (ref 96–112)
Creatinine, Ser: 2.68 mg/dL — ABNORMAL HIGH (ref 0.50–1.10)
GFR calc non Af Amer: 17 mL/min — ABNORMAL LOW (ref >90)
Potassium: 2.4 mEq/L — CL (ref 3.5–5.1)
Total Bilirubin: 0.5 mg/dL (ref 0.3–1.2)

## 2012-06-03 LAB — BASIC METABOLIC PANEL
CO2: 18 mEq/L — ABNORMAL LOW (ref 19–32)
Calcium: 9.1 mg/dL (ref 8.4–10.5)
Chloride: 107 mEq/L (ref 96–112)
Creatinine, Ser: 2.77 mg/dL — ABNORMAL HIGH (ref 0.50–1.10)
Glucose, Bld: 114 mg/dL — ABNORMAL HIGH (ref 70–99)
Potassium: 2.1 mEq/L — CL (ref 3.5–5.1)
Sodium: 140 mEq/L (ref 135–145)

## 2012-06-03 LAB — CBC
Hemoglobin: 14.9 g/dL (ref 12.0–15.0)
Hemoglobin: 14.6 g/dL (ref 12.0–15.0)
MCH: 32.2 pg (ref 26.0–34.0)
MCH: 31.1 pg (ref 26.0–34.0)
MCV: 96.8 fL (ref 78.0–100.0)
Platelets: 144 10*3/uL — ABNORMAL LOW (ref 150–400)
Platelets: 132 10*3/uL — ABNORMAL LOW (ref 150–400)
RBC: 4.63 MIL/uL (ref 3.87–5.11)
RBC: 4.69 MIL/uL (ref 3.87–5.11)
WBC: 10.3 10*3/uL (ref 4.0–10.5)
WBC: 8.7 10*3/uL (ref 4.0–10.5)

## 2012-06-03 MED ORDER — ONDANSETRON 4 MG PO TBDP
ORAL_TABLET | ORAL | Status: AC
  Filled 2012-06-03: qty 2

## 2012-06-03 MED ORDER — POTASSIUM CHLORIDE CRYS ER 20 MEQ PO TBCR
40.0000 meq | EXTENDED_RELEASE_TABLET | Freq: Once | ORAL | Status: AC
  Administered 2012-06-03: 40 meq via ORAL
  Filled 2012-06-03: qty 2

## 2012-06-03 MED ORDER — ONDANSETRON HCL 8 MG PO TABS
8.0000 mg | ORAL_TABLET | Freq: Once | ORAL | Status: AC
  Administered 2012-06-03: 8 mg via ORAL
  Filled 2012-06-03: qty 1

## 2012-06-03 MED ORDER — POTASSIUM CHLORIDE 10 MEQ/100ML IV SOLN
10.0000 meq | Freq: Once | INTRAVENOUS | Status: AC
  Administered 2012-06-03: 10 meq via INTRAVENOUS
  Filled 2012-06-03: qty 100

## 2012-06-03 MED ORDER — ACETAMINOPHEN 325 MG PO TABS
650.0000 mg | ORAL_TABLET | Freq: Once | ORAL | Status: AC
  Administered 2012-06-03: 650 mg via ORAL
  Filled 2012-06-03: qty 2

## 2012-06-03 NOTE — ED Notes (Addendum)
Pt also reports productive cough. Pt having episodes of vomiting at triage. Zofran given.

## 2012-06-03 NOTE — ED Notes (Signed)
Pt reports n/v and fever over the past 3 days. States she has not been able to eat or drink anything without vomiting. States she has had some fevers at home. Denies any abd pain at this time. Pt reports nausea at triage.

## 2012-06-03 NOTE — ED Notes (Signed)
Critical lab K+ 2.1. PA made aware.

## 2012-06-03 NOTE — ED Provider Notes (Signed)
History     CSN: [id number]  Arrival date & time 06/03/12  1717   First MD Initiated Contact with Patient 06/03/12 2014      Chief Complaint  Patient presents with  . Emesis  . Fever    (Consider location/radiation/quality/duration/timing/severity/associated sxs/prior treatment) HPI Comments: Mrs. Weissberg became concerned when the nausea and vomiting increased in frequency and she could no longer drink enough fluid to re(lenish the losses.  She denies any significant pain and reports the myalgias have improved over the last 24 hours.  Patient is a 67 y.o. female presenting with URI. The history is provided by the patient. No language interpreter was used.  URI The primary symptoms include fever, fatigue, cough, nausea, vomiting and myalgias. Primary symptoms do not include headaches, ear pain, sore throat, swollen glands, wheezing, abdominal pain, arthralgias or rash. The current episode started 6 to 7 days ago (worse over the last 3 days). This is a new problem. The problem has been gradually worsening.  Symptoms associated with the illness include chills, congestion and rhinorrhea. The illness is not associated with plugged ear sensation, facial pain or sinus pressure. Risk factors for severe complications from URI include being elderly.    Past Medical History  Diagnosis Date  . Chronic kidney disease   . GERD (gastroesophageal reflux disease)   . Crohn's colitis     Past Surgical History  Procedure Date  . Colon resection   . Esophagogastroduodenoscopy 03/04/2012    Procedure: ESOPHAGOGASTRODUODENOSCOPY (EGD);  Surgeon: Garlan Fair, MD;  Location: [facility];  Service: Endoscopy;  Laterality: N/A;  . Flexible sigmoidoscopy 03/04/2012    Procedure: FLEXIBLE SIGMOIDOSCOPY;  Surgeon: Garlan Fair, MD;  Location: [facility];  Service: Endoscopy;  Laterality: N/A;    History reviewed. No pertinent family history.  History  Substance Use Topics  . Smoking  status: Current Every Day Smoker -- 0.2 packs/day  . Smokeless tobacco: Not on file  . Alcohol Use: 1.2 oz/week    2 Glasses of wine per week    OB History    Grav Para Term Preterm Abortions TAB SAB Ect Mult Living                  Review of Systems  Constitutional: Positive for fever, chills and fatigue.  HENT: Positive for congestion and rhinorrhea. Negative for ear pain, sore throat and sinus pressure.   Respiratory: Positive for cough. Negative for wheezing.   Gastrointestinal: Positive for nausea and vomiting. Negative for abdominal pain.  Musculoskeletal: Positive for myalgias. Negative for arthralgias.  Skin: Negative for rash.  Neurological: Negative for headaches.  All other systems reviewed and are negative.    Allergies  Mercaptopurine and Remicade  Home Medications   Current Outpatient Rx  Name  Route  Sig  Dispense  Refill  . ACETAMINOPHEN 500 MG PO TABS   Oral   Take 500 mg by mouth every 6 (six) hours as needed. For severe pain         . CYANOCOBALAMIN 1000 MCG/ML IJ SOLN   Intramuscular   Inject 1,000 mcg into the muscle every 30 (thirty) days. Last injection was 05/07/12         . CYCLOBENZAPRINE HCL 10 MG PO TABS   Oral   Take 10 mg by mouth 3 (three) times daily as needed. For muscle spasms         . FOLIC ACID 1 MG PO TABS   Oral   Take 1 mg  by mouth daily.         [name] LEVOTHYROXINE SODIUM 125 MCG PO TABS   Oral   Take 125 mcg by mouth daily.         [name] MESALAMINE 400 MG PO TBEC   Oral   Take 400 mg by mouth 2 (two) times daily.         [name] OMEPRAZOLE 40 MG PO CPDR   Oral   Take 40 mg by mouth daily.         [name] PREDNISONE 5 MG PO TABS   Oral   Take 5 mg by mouth daily.         [name] RISEDRONATE SODIUM 35 MG PO TABS   Oral   Take 35 mg by mouth every 7 (seven) days. with water on empty stomach, nothing by mouth or lie down for next 30 minutes. Takes on Monday         . VITAMIN B-12 1000 MCG PO TABS   Oral   Take 1,000  mcg by mouth daily.           BP 104/74  Pulse 111  Temp 99.4 F (37.4 C)  Resp 19  SpO2 99%  Physical Exam  Nursing note and vitals reviewed. Constitutional: She is oriented to person, place, and time. She appears well-developed and well-nourished. No distress.  HENT:  Head: Normocephalic and atraumatic.  Right Ear: External ear normal.  Left Ear: External ear normal.  Nose: Nose normal.  Mouth/Throat: Oropharynx is clear and moist. No oropharyngeal exudate.  Eyes: Pupils are equal, round, and reactive to light. Right eye exhibits no discharge. Left eye exhibits no discharge. No scleral icterus.  Neck: Normal range of motion. Neck supple. No JVD present. No tracheal deviation present.  Cardiovascular: Regular rhythm, normal heart sounds and intact distal pulses.  Tachycardia present.  Exam reveals no gallop and no friction rub.   No murmur heard. Pulmonary/Chest: Effort normal. No stridor. No respiratory distress. She has no wheezes. She has no rales. She exhibits no tenderness.  Abdominal: Soft. Bowel sounds are normal. She exhibits no distension and no mass. There is no tenderness. There is no rebound and no guarding.  Musculoskeletal: Normal range of motion. She exhibits no edema and no tenderness.  Lymphadenopathy:    She has no cervical adenopathy.  Neurological: She is alert and oriented to person, place, and time. No cranial nerve deficit.  Skin: Skin is warm and dry. No rash noted. She is not diaphoretic. No erythema. No pallor.  Psychiatric: She has a normal mood and affect. Her behavior is normal.    ED Course  Procedures (including critical care time)  Labs Reviewed  CBC - Abnormal; Notable for the following:    Platelets 144 (*)     All other components within normal limits  BASIC METABOLIC PANEL - Abnormal; Notable for the following:    Potassium 2.1 (*)     CO2 18 (*)     Glucose, Bld 114 (*)     BUN 27 (*)     Creatinine, Ser 2.77 (*)     GFR calc non  Af Amer 17 (*)     GFR calc Af Amer 19 (*)     All other components within normal limits  CBC - Abnormal; Notable for the following:    Platelets 132 (*)     All other components within normal limits  COMPREHENSIVE METABOLIC PANEL - Abnormal; Notable for the following:  Potassium 2.4 (*)     CO2 18 (*)     Glucose, Bld 110 (*)     BUN 28 (*)     Creatinine, Ser 2.68 (*)     Albumin 3.3 (*)     Alkaline Phosphatase 185 (*)     GFR calc non Af Amer 17 (*)     GFR calc Af Amer 20 (*)     All other components within normal limits   Dg Chest 2 View  06/03/2012  *RADIOLOGY REPORT*  Clinical Data: Fever.  Emesis.  CHEST - 2 VIEW  Comparison: 06/08/2011.  Findings:  Cardiopericardial silhouette within normal limits. Mediastinal contours normal. Trachea midline.  No airspace disease or effusion. Cholecystectomy clips are present in the right upper quadrant.  IMPRESSION: No active cardiopulmonary disease.   Original Report Authenticated By: Dereck Ligas, M.D.      No diagnosis found.   Date: 06/03/2012  Rate: 111 bpm  Rhythm: sinus tachycardia  QRS Axis: left  Intervals: PR prolonged  ST/T Wave abnormalities: nonspecific ST changes - diffuse  Conduction Disutrbances:left anterior fascicular block  Narrative Interpretation:   Old EKG Reviewed: none available      MDM  Pt presents for evaluation of fever, NV, and uri-like s/s over the last week - worse x 3 days.  She appears nontoxic, note elevated HR, NAD.  She describes s/s consistent with influenza.  Secondary to persistent cough, will obtain a CXR.  Basic labs obtained during the triage process reveal renal insufficiency (chronic) and hypokalemia.  She has a hx of chronic hypokalemia and has not taken her potassium in several days.  The vomiting has probably exacerbated this issue.  She has receive 40 meq of po potassium and 10 meq of IV potassium is infusing.  Will administer antiemetics and po challenge with clears also.  0005.   Pt stable, NAD.  She is tolerating po fluids.  On repeat vital signs, she is again febrile.  Administered po tylenol.  Will discharge home.  Her history and exam are consistent with influenza.  Will prescribe zofran and phenergan for be used prn for nausea.  Will also prescribe potassium chloride to be taken over the next 4 days.  Encouraged close follow-up with her PMD.  The renal insufficiency noted is not an acute process.  There ws no evidence of pneumonia on the CXR.      Perlie Mayo, MD 06/04/12 [phone]

## 2012-06-04 MED ORDER — ONDANSETRON 4 MG PO TBDP: 4.0000 mg | ORAL_TABLET | Freq: Three times a day (TID) | ORAL | Status: DC | PRN

## 2012-06-04 MED ORDER — POTASSIUM CHLORIDE ER 10 MEQ PO TBCR: 20.0000 meq | EXTENDED_RELEASE_TABLET | Freq: Two times a day (BID) | ORAL | Status: DC

## 2012-06-04 MED ORDER — PROMETHAZINE HCL 25 MG PO TABS: 25.0000 mg | ORAL_TABLET | Freq: Four times a day (QID) | ORAL | Status: DC | PRN

## 2012-06-07 ENCOUNTER — Encounter (HOSPITAL_COMMUNITY): Payer: Self-pay | Admitting: *Deleted

## 2012-06-07 ENCOUNTER — Inpatient Hospital Stay (HOSPITAL_COMMUNITY)
Admission: EM | Admit: 2012-06-07 | Discharge: 2012-06-15 | DRG: 371 | Disposition: A | Discharge status: Home / Self Care | Payer: Medicare Other | Attending: Internal Medicine | Admitting: Internal Medicine

## 2012-06-07 ENCOUNTER — Inpatient Hospital Stay (HOSPITAL_COMMUNITY): Payer: Medicare Other

## 2012-06-07 ENCOUNTER — Other Ambulatory Visit: Payer: Self-pay

## 2012-06-07 DIAGNOSIS — R197 Diarrhea, unspecified: Secondary | ICD-10-CM

## 2012-06-07 DIAGNOSIS — N039 Chronic nephritic syndrome with unspecified morphologic changes: Secondary | ICD-10-CM | POA: Diagnosis not present

## 2012-06-07 DIAGNOSIS — N390 Urinary tract infection, site not specified: Secondary | ICD-10-CM | POA: Diagnosis present

## 2012-06-07 DIAGNOSIS — Z79899 Other long term (current) drug therapy: Secondary | ICD-10-CM

## 2012-06-07 DIAGNOSIS — E872 Acidosis, unspecified: Secondary | ICD-10-CM | POA: Diagnosis present

## 2012-06-07 DIAGNOSIS — F17200 Nicotine dependence, unspecified, uncomplicated: Secondary | ICD-10-CM | POA: Diagnosis present

## 2012-06-07 DIAGNOSIS — Z992 Dependence on renal dialysis: Secondary | ICD-10-CM | POA: Diagnosis present

## 2012-06-07 DIAGNOSIS — IMO0002 Reserved for concepts with insufficient information to code with codable children: Secondary | ICD-10-CM

## 2012-06-07 DIAGNOSIS — E039 Hypothyroidism, unspecified: Secondary | ICD-10-CM | POA: Diagnosis present

## 2012-06-07 DIAGNOSIS — IMO0001 Reserved for inherently not codable concepts without codable children: Secondary | ICD-10-CM

## 2012-06-07 DIAGNOSIS — A020 Salmonella enteritis: Principal | ICD-10-CM | POA: Diagnosis present

## 2012-06-07 DIAGNOSIS — D631 Anemia in chronic kidney disease: Secondary | ICD-10-CM | POA: Diagnosis not present

## 2012-06-07 DIAGNOSIS — E860 Dehydration: Secondary | ICD-10-CM | POA: Diagnosis present

## 2012-06-07 DIAGNOSIS — D649 Anemia, unspecified: Secondary | ICD-10-CM | POA: Diagnosis not present

## 2012-06-07 DIAGNOSIS — K5090 Crohn's disease, unspecified, without complications: Secondary | ICD-10-CM | POA: Diagnosis present

## 2012-06-07 DIAGNOSIS — R112 Nausea with vomiting, unspecified: Secondary | ICD-10-CM | POA: Diagnosis present

## 2012-06-07 DIAGNOSIS — N189 Chronic kidney disease, unspecified: Secondary | ICD-10-CM

## 2012-06-07 DIAGNOSIS — E876 Hypokalemia: Secondary | ICD-10-CM | POA: Diagnosis present

## 2012-06-07 DIAGNOSIS — N184 Chronic kidney disease, stage 4 (severe): Secondary | ICD-10-CM | POA: Diagnosis present

## 2012-06-07 DIAGNOSIS — N179 Acute kidney failure, unspecified: Secondary | ICD-10-CM | POA: Diagnosis present

## 2012-06-07 DIAGNOSIS — N186 End stage renal disease: Secondary | ICD-10-CM | POA: Diagnosis present

## 2012-06-07 DIAGNOSIS — K5010 Crohn's disease of large intestine without complications: Secondary | ICD-10-CM | POA: Diagnosis present

## 2012-06-07 DIAGNOSIS — K219 Gastro-esophageal reflux disease without esophagitis: Secondary | ICD-10-CM | POA: Diagnosis present

## 2012-06-07 DIAGNOSIS — N170 Acute kidney failure with tubular necrosis: Secondary | ICD-10-CM | POA: Diagnosis present

## 2012-06-07 DIAGNOSIS — D638 Anemia in other chronic diseases classified elsewhere: Secondary | ICD-10-CM | POA: Diagnosis not present

## 2012-06-07 DIAGNOSIS — I959 Hypotension, unspecified: Secondary | ICD-10-CM | POA: Diagnosis present

## 2012-06-07 LAB — COMPREHENSIVE METABOLIC PANEL
ALT: 5 U/L (ref 0–35)
Albumin: 2.6 g/dL — ABNORMAL LOW (ref 3.5–5.2)
Alkaline Phosphatase: 166 U/L — ABNORMAL HIGH (ref 39–117)
BUN: 60 mg/dL — ABNORMAL HIGH (ref 6–23)
CO2: 10 mEq/L — CL (ref 19–32)
Chloride: 110 mEq/L (ref 96–112)
GFR calc Af Amer: 5 mL/min — ABNORMAL LOW (ref >90)
Glucose, Bld: 66 mg/dL — ABNORMAL LOW (ref 70–99)
Potassium: 2.5 mEq/L — CL (ref 3.5–5.1)
Sodium: 138 mEq/L (ref 135–145)
Total Bilirubin: 0.3 mg/dL (ref 0.3–1.2)

## 2012-06-07 LAB — CBC WITH DIFFERENTIAL/PLATELET
Basophils Relative: 0 % (ref 0–1)
Eosinophils Relative: 1 % (ref 0–5)
HCT: 41.1 % (ref 36.0–46.0)
Hemoglobin: 13.6 g/dL (ref 12.0–15.0)
Lymphocytes Relative: 10 % — ABNORMAL LOW (ref 12–46)
MCHC: 33.1 g/dL (ref 30.0–36.0)
MCV: 96.7 fL (ref 78.0–100.0)
Monocytes Relative: 16 % — ABNORMAL HIGH (ref 3–12)
Neutro Abs: 6.4 10*3/uL (ref 1.7–7.7)
Neutrophils Relative %: 73 % (ref 43–77)
RBC: 4.25 MIL/uL (ref 3.87–5.11)
WBC: 8.8 10*3/uL (ref 4.0–10.5)

## 2012-06-07 LAB — URINALYSIS, ROUTINE W REFLEX MICROSCOPIC
Bilirubin Urine: NEGATIVE
Nitrite: NEGATIVE
Specific Gravity, Urine: 1.022 (ref 1.005–1.030)
Urobilinogen, UA: 0.2 mg/dL (ref 0.0–1.0)
pH: 5 (ref 5.0–8.0)

## 2012-06-07 LAB — LIPASE, BLOOD: Lipase: 39 U/L (ref 11–59)

## 2012-06-07 LAB — URINE MICROSCOPIC-ADD ON

## 2012-06-07 MED ORDER — ONDANSETRON HCL 4 MG PO TABS
4.0000 mg | ORAL_TABLET | Freq: Four times a day (QID) | ORAL | Status: DC | PRN
  Administered 2012-06-14: 4 mg via ORAL

## 2012-06-07 MED ORDER — ONDANSETRON HCL 4 MG/2ML IJ SOLN
4.0000 mg | Freq: Four times a day (QID) | INTRAMUSCULAR | Status: DC | PRN
  Administered 2012-06-08 – 2012-06-12 (×6): 4 mg via INTRAVENOUS
  Filled 2012-06-07 (×6): qty 2

## 2012-06-07 MED ORDER — HYDROCORTISONE SOD SUCCINATE 100 MG IJ SOLR
50.0000 mg | Freq: Three times a day (TID) | INTRAMUSCULAR | Status: DC
  Administered 2012-06-07 – 2012-06-11 (×13): 50 mg via INTRAVENOUS
  Filled 2012-06-07 (×5): qty 1
  Filled 2012-06-07: qty 2
  Filled 2012-06-07 (×4): qty 1
  Filled 2012-06-07: qty 2
  Filled 2012-06-07 (×8): qty 1

## 2012-06-07 MED ORDER — SODIUM CHLORIDE 0.9 % IJ SOLN
3.0000 mL | Freq: Two times a day (BID) | INTRAMUSCULAR | Status: DC
  Administered 2012-06-07 – 2012-06-09 (×3): 3 mL via INTRAVENOUS
  Administered 2012-06-10: 11:00:00 via INTRAVENOUS
  Administered 2012-06-11 – 2012-06-15 (×4): 3 mL via INTRAVENOUS

## 2012-06-07 MED ORDER — MESALAMINE 400 MG PO CPDR
400.0000 mg | DELAYED_RELEASE_CAPSULE | Freq: Two times a day (BID) | ORAL | Status: DC
  Administered 2012-06-07 – 2012-06-08 (×3): 400 mg via ORAL
  Filled 2012-06-07 (×5): qty 1

## 2012-06-07 MED ORDER — OSELTAMIVIR PHOSPHATE 75 MG PO CAPS
75.0000 mg | ORAL_CAPSULE | Freq: Every day | ORAL | Status: DC
  Administered 2012-06-07: 75 mg via ORAL
  Filled 2012-06-07 (×2): qty 1

## 2012-06-07 MED ORDER — ENOXAPARIN SODIUM 30 MG/0.3ML ~~LOC~~ SOLN
30.0000 mg | SUBCUTANEOUS | Status: DC
  Administered 2012-06-07 – 2012-06-14 (×8): 30 mg via SUBCUTANEOUS
  Filled 2012-06-07 (×10): qty 0.3

## 2012-06-07 MED ORDER — ONDANSETRON HCL 4 MG/2ML IJ SOLN
4.0000 mg | Freq: Once | INTRAMUSCULAR | Status: AC
  Administered 2012-06-07: 4 mg via INTRAVENOUS
  Filled 2012-06-07: qty 2

## 2012-06-07 MED ORDER — OSELTAMIVIR PHOSPHATE 75 MG PO CAPS: 75.0000 mg | ORAL_CAPSULE | Freq: Two times a day (BID) | ORAL | Status: DC

## 2012-06-07 MED ORDER — SODIUM CHLORIDE 0.9 % IV SOLN
INTRAVENOUS | Status: DC
  Administered 2012-06-07: 16:00:00 via INTRAVENOUS

## 2012-06-07 MED ORDER — DEXTROSE 5 % IV SOLN
INTRAVENOUS | Status: DC
  Administered 2012-06-07 – 2012-06-08 (×2): via INTRAVENOUS
  Filled 2012-06-07 (×5): qty 100

## 2012-06-07 MED ORDER — POTASSIUM CHLORIDE 10 MEQ/100ML IV SOLN
10.0000 meq | INTRAVENOUS | Status: AC
  Administered 2012-06-07 (×4): 10 meq via INTRAVENOUS
  Filled 2012-06-07: qty 400

## 2012-06-07 MED ORDER — KETOROLAC TROMETHAMINE 30 MG/ML IJ SOLN
30.0000 mg | Freq: Once | INTRAMUSCULAR | Status: AC
  Administered 2012-06-07: 30 mg via INTRAVENOUS
  Filled 2012-06-07: qty 1

## 2012-06-07 MED ORDER — DEXTROSE 5 % IV SOLN
1.0000 g | INTRAVENOUS | Status: DC
  Administered 2012-06-07 – 2012-06-09 (×3): 1 g via INTRAVENOUS
  Filled 2012-06-07 (×3): qty 10

## 2012-06-07 MED ORDER — SODIUM BICARBONATE 8.4 % IV SOLN
INTRAVENOUS | Status: DC
Start: 2012-06-07 — End: 2012-06-07

## 2012-06-07 MED ORDER — VITAMIN B-12 1000 MCG PO TABS
1000.0000 ug | ORAL_TABLET | Freq: Every day | ORAL | Status: DC
  Administered 2012-06-08 – 2012-06-15 (×8): 1000 ug via ORAL
  Filled 2012-06-07 (×8): qty 1

## 2012-06-07 MED ORDER — PANTOPRAZOLE SODIUM 40 MG IV SOLR
40.0000 mg | Freq: Two times a day (BID) | INTRAVENOUS | Status: DC
  Administered 2012-06-07 – 2012-06-12 (×11): 40 mg via INTRAVENOUS
  Filled 2012-06-07 (×14): qty 40

## 2012-06-07 MED ORDER — ONDANSETRON HCL 4 MG/2ML IJ SOLN: 4.0000 mg | Freq: Three times a day (TID) | INTRAMUSCULAR | Status: AC | PRN

## 2012-06-07 MED ORDER — ACETAMINOPHEN 500 MG PO TABS
500.0000 mg | ORAL_TABLET | Freq: Four times a day (QID) | ORAL | Status: DC | PRN
  Administered 2012-06-13: 500 mg via ORAL
  Filled 2012-06-07: qty 1

## 2012-06-07 MED ORDER — FOLIC ACID 1 MG PO TABS
1.0000 mg | ORAL_TABLET | Freq: Every day | ORAL | Status: DC
  Administered 2012-06-07 – 2012-06-15 (×9): 1 mg via ORAL
  Filled 2012-06-07 (×9): qty 1

## 2012-06-07 MED ORDER — MORPHINE SULFATE 2 MG/ML IJ SOLN
1.0000 mg | INTRAMUSCULAR | Status: DC | PRN
  Administered 2012-06-07 – 2012-06-11 (×4): 1 mg via INTRAVENOUS
  Filled 2012-06-07 (×5): qty 1

## 2012-06-07 MED ORDER — POTASSIUM CHLORIDE 10 MEQ/100ML IV SOLN
10.0000 meq | Freq: Once | INTRAVENOUS | Status: AC
  Administered 2012-06-07: 10 meq via INTRAVENOUS
  Filled 2012-06-07: qty 100

## 2012-06-07 MED ORDER — SODIUM CHLORIDE 0.9 % IV BOLUS (SEPSIS)
1000.0000 mL | Freq: Once | INTRAVENOUS | Status: AC
  Administered 2012-06-07: 1000 mL via INTRAVENOUS

## 2012-06-07 NOTE — ED Notes (Signed)
Pt assisted to BR.

## 2012-06-07 NOTE — ED Provider Notes (Signed)
History     CSN: [id number]  Arrival date & time 06/07/12  1114   First MD Initiated Contact with Patient 06/07/12 1133      Chief Complaint  Patient presents with  . Nausea  . Emesis    (Consider location/radiation/quality/duration/timing/severity/associated sxs/prior treatment) HPI Comments: Patient with history of crohn's disease.  Presents with a one week history of n/v/loose stools.  She reports these are non-bloody.  No fevers or chills.  No urinary complaints.  She was seen at [facility] several days ago for similar complaints.  She was diagnosed with flu and hypokalemia, treated and released.  She is feeling worse and states she is unable to eat or drink and is not able to keep the nausea medications she was given down.   The history is provided by the patient.    Past Medical History  Diagnosis Date  . Chronic kidney disease   . GERD (gastroesophageal reflux disease)   . Crohn's colitis     Past Surgical History  Procedure Date  . Colon resection   . Esophagogastroduodenoscopy 03/04/2012    Procedure: ESOPHAGOGASTRODUODENOSCOPY (EGD);  Surgeon: Garlan Fair, MD;  Location: [facility];  Service: Endoscopy;  Laterality: N/A;  . Flexible sigmoidoscopy 03/04/2012    Procedure: FLEXIBLE SIGMOIDOSCOPY;  Surgeon: Garlan Fair, MD;  Location: [facility];  Service: Endoscopy;  Laterality: N/A;    History reviewed. No pertinent family history.  History  Substance Use Topics  . Smoking status: Current Every Day Smoker -- 0.2 packs/day  . Smokeless tobacco: Not on file  . Alcohol Use: 1.2 oz/week    2 Glasses of wine per week    OB History    Grav Para Term Preterm Abortions TAB SAB Ect Mult Living                  Review of Systems  All other systems reviewed and are negative.    Allergies  Mercaptopurine and Remicade  Home Medications   Current Outpatient Rx  Name  Route  Sig  Dispense  Refill  . ACETAMINOPHEN 500 MG PO TABS   Oral   Take 500 mg  by mouth every 6 (six) hours as needed. For severe pain         . CYANOCOBALAMIN 1000 MCG/ML IJ SOLN   Intramuscular   Inject 1,000 mcg into the muscle every 30 (thirty) days. Last injection was 05/07/12         . CYCLOBENZAPRINE HCL 10 MG PO TABS   Oral   Take 10 mg by mouth 3 (three) times daily as needed. For muscle spasms         . FOLIC ACID 1 MG PO TABS   Oral   Take 1 mg by mouth daily.         [name] LEVOTHYROXINE SODIUM 125 MCG PO TABS   Oral   Take 125 mcg by mouth daily.         [name] MESALAMINE 400 MG PO TBEC   Oral   Take 400 mg by mouth 2 (two) times daily.         [name] OMEPRAZOLE 40 MG PO CPDR   Oral   Take 40 mg by mouth daily.         [name] ONDANSETRON 4 MG PO TBDP   Oral   Take 1 tablet (4 mg total) by mouth every 8 (eight) hours as needed for nausea.   15 tablet   0   . POTASSIUM  CHLORIDE ER 10 MEQ PO TBCR   Oral   Take 2 tablets (20 mEq total) by mouth 2 (two) times daily.   10 tablet   0   . PREDNISONE 5 MG PO TABS   Oral   Take 5 mg by mouth daily.         [name] PROMETHAZINE HCL 25 MG PO TABS   Oral   Take 1 tablet (25 mg total) by mouth every 6 (six) hours as needed for nausea.   16 tablet   0   . RISEDRONATE SODIUM 35 MG PO TABS   Oral   Take 35 mg by mouth every 7 (seven) days. with water on empty stomach, nothing by mouth or lie down for next 30 minutes. Takes on Monday         . VITAMIN B-12 1000 MCG PO TABS   Oral   Take 1,000 mcg by mouth daily.           BP 84/49  Pulse 69  Temp 98.5 F (36.9 C) (Oral)  Resp 20  SpO2 98%  Physical Exam  Nursing note and vitals reviewed. Constitutional: She is oriented to person, place, and time. She appears well-developed and well-nourished.       Patient is thin, cachectic.    HENT:  Head: Normocephalic and atraumatic.  Mouth/Throat: Oropharynx is clear and moist.  Neck: Normal range of motion. Neck supple.  Cardiovascular: Normal rate and regular rhythm.  Exam reveals no gallop  and no friction rub.   No murmur heard. Pulmonary/Chest: Effort normal and breath sounds normal. No respiratory distress. She has no wheezes.  Abdominal: Soft. Bowel sounds are normal. She exhibits no distension. There is no tenderness.       There is vague generalized abd ttp without rebound or guarding.    Musculoskeletal: Normal range of motion.  Neurological: She is alert and oriented to person, place, and time.  Skin: Skin is warm and dry.    ED Course  Procedures (including critical care time)   Labs Reviewed  CBC WITH DIFFERENTIAL  COMPREHENSIVE METABOLIC PANEL  LIPASE, BLOOD  URINALYSIS, ROUTINE W REFLEX MICROSCOPIC   No results found.   No diagnosis found.    MDM  The patient presents here with n/v/d.  She has a history of Crohns' disease and CRI.  She was given ivf and laboratory studies were obtained.  The labs returned revealing dehydration and a dramatic worsening of her renal function.  I will consult medicine for admission.          Veryl Speak, MD 06/07/12 1316

## 2012-06-07 NOTE — H&P (Signed)
[facility] History and Physical  Natasha [name] WHQ:[id number] DOB: 1946-01-24 DOA: 06/07/2012  Referring physician: Dr. Stark Jock PCP: Natasha Fair, MD  Specialists:   Chief Complaint: Persistent nausea/vomiting/diarrhea with abdominal pain.  HPI: Natasha [name] is a 67 y.o. female with history of CKD-as creatinine 2.68 on 1/9, Crohn's colitis and GERD who presents with above complaints. She states that she began having nausea and vomiting 5 days ago-was seen at the: ED and started on antiemetics and discharged home. She reports that following that she developed abdominal pain which has gradually worsened, she also states she's been having diarrhea-3-4 episodes per day, nonbloody. She states she is continued to have the nausea vomiting 3-4 times a day as well nonbloody. She also admits to arm subjective fevers, generalized body aches, and that initially she had a cough but that arm has resolved. She was seen in the ED and a chest x-ray was done and showed no active cardiopulmonary disease, and labs revealed a creatinine of the 9.21 with a BUN of 60 potassium of 2.5 a CO2 of 10. The EDP consulted renal-spoke with Dr. Jonnie Finner who states patient does not need to be [facility] and recommended admitting to the hospitalist service at [facility]. Her urinalysis was consistent with a UTI, and white cell count within normal limits at 8.8. She is admitted for further evaluation and management.   Review of Systems: The patient denies anorexia, fever, weight loss,, vision loss, decreased hearing, hoarseness, chest pain, syncope, dyspnea on exertion, peripheral edema, balance deficits, hemoptysis, abdominal pain, melena, hematochezia, severe indigestion/heartburn, hematuria, incontinence, genital sores, muscle weakness, suspicious skin lesions, transient blindness, difficulty walking, depression, unusual weight change, abnormal bleeding, enlarged lymph nodes, angioedema, and breast masses.      Past Medical History  Diagnosis Date  . Chronic kidney disease   . GERD (gastroesophageal reflux disease)   . Crohn's colitis    Past Surgical History  Procedure Date  . Colon resection   . Esophagogastroduodenoscopy 03/04/2012    Procedure: ESOPHAGOGASTRODUODENOSCOPY (EGD);  Surgeon: Natasha Fair, MD;  Location: [facility];  Service: Endoscopy;  Laterality: N/A;  . Flexible sigmoidoscopy 03/04/2012    Procedure: FLEXIBLE SIGMOIDOSCOPY;  Surgeon: Natasha Fair, MD;  Location: [facility];  Service: Endoscopy;  Laterality: N/A;   Social History:  reports that she has been smoking.  She has never used smokeless tobacco. She reports that she drinks about 1.2 ounces of alcohol per week. Her drug history not on file. where does patient live--home  Can patient participate in ADLs- yes Family history: Her brother had diabetes mellitus. Allergies  Allergen Reactions  . Mercaptopurine     unknown  . Remicade (Infliximab)     unknown      Prior to Admission medications   Medication Sig Start Date End Date Taking? Authorizing Provider  acetaminophen (TYLENOL) 500 MG tablet Take 500 mg by mouth every 6 (six) hours as needed. For severe pain   Yes Historical Provider, MD  cyanocobalamin (,VITAMIN B-12,) 1000 MCG/ML injection Inject 1,000 mcg into the muscle every 30 (thirty) days. Last injection was 05/07/12   Yes Historical Provider, MD  folic acid (FOLVITE) 1 MG tablet Take 1 mg by mouth daily.   Yes Historical Provider, MD  levothyroxine (SYNTHROID, LEVOTHROID) 125 MCG tablet Take 125 mcg by mouth daily.   Yes Historical Provider, MD  mesalamine (ASACOL) 400 MG EC tablet Take 400 mg by mouth 2 (two) times daily.   Yes Historical Provider, MD  omeprazole (PRILOSEC) 40 MG capsule Take 40 mg by mouth daily.   Yes Historical Provider, MD  ondansetron (ZOFRAN ODT) 4 MG disintegrating tablet Take 1 tablet (4 mg total) by mouth every 8 (eight) hours as needed for nausea. 06/04/12   Yes Perlie Mayo, MD  potassium chloride (K-DUR) 10 MEQ tablet Take 2 tablets (20 mEq total) by mouth 2 (two) times daily. 06/04/12  Yes Perlie Mayo, MD  predniSONE (DELTASONE) 5 MG tablet Take 5 mg by mouth daily.   Yes Historical Provider, MD  promethazine (PHENERGAN) 25 MG tablet Take 1 tablet (25 mg total) by mouth every 6 (six) hours as needed for nausea. 06/04/12  Yes Perlie Mayo, MD  risedronate (ACTONEL) 35 MG tablet Take 35 mg by mouth every 7 (seven) days. with water on empty stomach, nothing by mouth or lie down for next 30 minutes. Takes on Monday   Yes Historical Provider, MD  vitamin B-12 (CYANOCOBALAMIN) 1000 MCG tablet Take 1,000 mcg by mouth daily.   Yes Historical Provider, MD   Physical Exam: Filed Vitals:   06/07/12 1338 06/07/12 1400 06/07/12 1430 06/07/12 1549  BP: 101/61 93/57 99/60    Pulse: 110 106    Temp:      TempSrc:      Resp: 17 16 16    Height:    5' 5"  (1.651 m)  Weight:    52.9 kg (116 lb 10 oz)  SpO2: 100% 98%      Constitutional: Vital signs reviewed.  Patient is a well-developed and well-nourished  in no acute distress and cooperative with exam. Alert and oriented x3.  Head: Normocephalic and atraumatic Mouth: no erythema or exudatesslightly dry MM Eyes: PERRL, EOMI, conjunctivae normal, No scleral icterus.  Neck: Supple, Trachea midline normal ROM, No JVD, mass, thyromegaly, or carotid bruit present.  Cardiovascular: RRR, S1 normal, S2 normal, no MRG, pulses symmetric and intact bilaterally Pulmonary/Chest: CTAB, no wheezes, rales, or rhonchi Abdominal: Soft. Non-tender, diffusely tender, no rebound, bowel sounds are normal, no masses, organomegaly, or guarding present.  Extremities: No cyanosis and no edema  Neurological: A&O x3, Strength is normal and symmetric bilaterally, cranial nerve II-XII are grossly intact, no focal motor deficit, sensory intact to light touch bilaterally.  Skin: Warm, dry and intact. No rash.  Psychiatric: Normal  mood and affect. speech and behavior is normal.   Labs on Admission:  Basic Metabolic Panel:  Lab 29/56/21 1210 06/03/12 2014 06/03/12 1910  NA 138 139 140  K 2.5* 2.4* 2.1*  CL 110 107 107  CO2 10* 18* 18*  GLUCOSE 66* 110* 114*  BUN 60* 28* 27*  CREATININE 9.21* 2.68* 2.77*  CALCIUM 7.1* 9.0 9.1  MG -- -- --  PHOS -- -- --   Liver Function Tests:  Lab 06/07/12 1210 06/03/12 2014  AST 11 15  ALT 5 8  ALKPHOS 166* 185*  BILITOT 0.3 0.5  PROT 6.3 7.5  ALBUMIN 2.6* 3.3*    Lab 06/07/12 1210  LIPASE 39  AMYLASE --   No results found for this basename: AMMONIA:5 in the last 168 hours CBC:  Lab 06/07/12 1210 06/03/12 2014 06/03/12 1910  WBC 8.8 8.7 10.3  NEUTROABS 6.4 -- --  HGB 13.6 14.6 14.9  HCT 41.1 45.4 44.5  MCV 96.7 96.8 96.1  PLT 137* 132* 144*   Cardiac Enzymes: No results found for this basename: CKTOTAL:5,CKMB:5,CKMBINDEX:5,TROPONINI:5 in the last 168 hours  BNP (last 3 results) No results found for this basename: PROBNP:3  in the last 8760 hours CBG: No results found for this basename: GLUCAP:5 in the last 168 hours  Radiological Exams on Admission: No results found.    Assessment/Plan Active Problems: Nausea/ vomiting/Diarrhea -As discussed above, and patient with Crohn's disease -Will obtain stool studies, hydrate with IV fluids -She is on chronic steroids for her Crohn's and so will start on stress dose steroids and follow. -Obtain CT scan of abdomen and pelvis with oral contrast only -Gastroenterologist is Dr. Earle Gell, follow and consult if not improving//pending studies Hypotension -Likely secondary to hypovolemia due to #1, she is also on chronic prednisone so will cover with stress dose steroids as above versus infection -Hydrate with IV fluids -Obtain cortisol level, stressors steroids and follow. -Her urinalysis is consistent with a UTI, obtain urine and blood cultures, empiric antibiotics with Rocephin and follow  Acute on  chronic renal failure -As discussed above her last creatinine on 1/9 was 2.68, and up to 9.21 today. - Dehydration, hypotension, UTI,  likely contributing factors -EDP discussed with Dr. Jonnie Finner and he states her renal function should recover with hydration and so no need for transfer to [facility] at this time. -Hydrate with  bicarbonate added to fluids, follow recheck. -Treat urinary tract infection as below.  Dehydration -Hydration with IV fluids follow and recheck. Probable urinary tract infection -Obtain urine cultures, empiric antibiotics with Rocephin and follow.  Crohn's disease -Continue outpatient meds, stress dose steroids as above and follow.  Metabolic acidosis -Secondary to acute on chronic renal failure, placed on bicarbonate- follow and recheck.  Hypokalemia -Secondary to GI losses, replace potassium Flulike symptoms -Obtain influenza panel, cover with Tamiflu are pending results   Code Status: Full code Family Communication: Directly with patient at bedside Disposition Plan: Admit to step down  Time spent: >76mns  VSheila OatsTriad Hospitalists Pager [phone] If 7PM-7AM, please contact night-coverage [email or web address] Password [facility]1/13/2014, 5:01 PM

## 2012-06-07 NOTE — ED Notes (Signed)
Pt arrives from home c/o n/v abd pain. Reports hx of chron's disease. Reports not eating x's 3 days, unable to keep food/fluids down.

## 2012-06-07 NOTE — ED Notes (Signed)
OBO:FP69<GS> Expected date:<BR> Expected time:<BR> Means of arrival:<BR> Comments:<BR> n/v

## 2012-06-07 NOTE — ED Notes (Signed)
MD at bedside. 

## 2012-06-08 DIAGNOSIS — E876 Hypokalemia: Secondary | ICD-10-CM

## 2012-06-08 DIAGNOSIS — N390 Urinary tract infection, site not specified: Secondary | ICD-10-CM

## 2012-06-08 LAB — URINE CULTURE

## 2012-06-08 LAB — BASIC METABOLIC PANEL
BUN: 69 mg/dL — ABNORMAL HIGH (ref 6–23)
CO2: 10 mEq/L — CL (ref 19–32)
Calcium: 6.5 mg/dL — ABNORMAL LOW (ref 8.4–10.5)
Chloride: 104 mEq/L (ref 96–112)
Creatinine, Ser: 10.49 mg/dL — ABNORMAL HIGH (ref 0.50–1.10)
Glucose, Bld: 115 mg/dL — ABNORMAL HIGH (ref 70–99)

## 2012-06-08 LAB — CLOSTRIDIUM DIFFICILE BY PCR: Toxigenic C. Difficile by PCR: NEGATIVE

## 2012-06-08 LAB — SODIUM, URINE, RANDOM: Sodium, Ur: 83 mEq/L

## 2012-06-08 LAB — INFLUENZA PANEL BY PCR (TYPE A & B)
H1N1 flu by pcr: NOT DETECTED
Influenza A By PCR: NEGATIVE
Influenza B By PCR: NEGATIVE

## 2012-06-08 MED ORDER — SODIUM BICARBONATE 8.4 % IV SOLN
INTRAVENOUS | Status: DC
  Administered 2012-06-08 – 2012-06-12 (×10): via INTRAVENOUS
  Filled 2012-06-08 (×24): qty 150

## 2012-06-08 MED ORDER — VITAMINS A & D EX OINT
TOPICAL_OINTMENT | CUTANEOUS | Status: AC
  Administered 2012-06-08: 21:00:00
  Filled 2012-06-08: qty 10

## 2012-06-08 MED ORDER — SODIUM CHLORIDE 0.9 % IV BOLUS (SEPSIS)
2000.0000 mL | Freq: Once | INTRAVENOUS | Status: AC
  Administered 2012-06-08: 1000 mL via INTRAVENOUS

## 2012-06-08 MED ORDER — SODIUM CHLORIDE 0.45 % IV SOLN
INTRAVENOUS | Status: DC
  Administered 2012-06-08: 14:00:00 via INTRAVENOUS
  Filled 2012-06-08 (×2): qty 1000

## 2012-06-08 MED ORDER — LEVOTHYROXINE SODIUM 125 MCG PO TABS
125.0000 ug | ORAL_TABLET | Freq: Every day | ORAL | Status: DC
  Administered 2012-06-09 – 2012-06-15 (×7): 125 ug via ORAL
  Filled 2012-06-08 (×9): qty 1

## 2012-06-08 MED ORDER — POTASSIUM CHLORIDE 10 MEQ/100ML IV SOLN
10.0000 meq | INTRAVENOUS | Status: AC
  Administered 2012-06-08 (×6): 10 meq via INTRAVENOUS
  Filled 2012-06-08 (×6): qty 100

## 2012-06-08 NOTE — Consult Note (Signed)
RENNIE HACK 06/08/2012 Lakyn Alsteen D Requesting Physician:  Dr. Inis Sizer  Reason for Consult:  Acute on chronic renal failure HPI: The patient is a 67 y.o. year-old with hx of Crohn's disease and CKD stage IV followed by [facility]). Patient was admitted yesterday with N/V/D for 1 week and creatinine of 9.5. Creatinine had been 2.7 in ED on 1/9 last week when she was diagnosed with flu and d/c'd home. She reports +n/v/d, no sob or CP, no f/c/s, no use of NSAID's, +tylenol. UOP has been down, some+ wt loss  ROS  no ha, or visual chg  no skin rash or itching  no abd pain  no dysuria, hematuria  takes chronic pred 5/d at minimum for Crohn's   had abd surgery for Crohn's in 1980's but none since   Past Medical History:  Past Medical History  Diagnosis Date  . Chronic kidney disease   . GERD (gastroesophageal reflux disease)   . Crohn's colitis     Past Surgical History:  Past Surgical History  Procedure Date  . Colon resection   . Esophagogastroduodenoscopy 03/04/2012    Procedure: ESOPHAGOGASTRODUODENOSCOPY (EGD);  Surgeon: Garlan Fair, MD;  Location: [facility];  Service: Endoscopy;  Laterality: N/A;  . Flexible sigmoidoscopy 03/04/2012    Procedure: FLEXIBLE SIGMOIDOSCOPY;  Surgeon: Garlan Fair, MD;  Location: [facility];  Service: Endoscopy;  Laterality: N/A;    Family History: History reviewed. No pertinent family history. Social History:  reports that she has been smoking.  She has never used smokeless tobacco. She reports that she drinks about 1.2 ounces of alcohol per week. Her drug history not on file.  Allergies:  Allergies  Allergen Reactions  . Mercaptopurine     unknown  . Remicade (Infliximab)     unknown    Home medications: Prior to Admission medications   Medication Sig Start Date End Date Taking? Authorizing Provider  acetaminophen (TYLENOL) 500 MG tablet Take 500 mg by mouth every 6 (six) hours as needed. For severe pain   Yes  Historical Provider, MD  cyanocobalamin (,VITAMIN B-12,) 1000 MCG/ML injection Inject 1,000 mcg into the muscle every 30 (thirty) days. Last injection was 05/07/12   Yes Historical Provider, MD  folic acid (FOLVITE) 1 MG tablet Take 1 mg by mouth daily.   Yes Historical Provider, MD  levothyroxine (SYNTHROID, LEVOTHROID) 125 MCG tablet Take 125 mcg by mouth daily.   Yes Historical Provider, MD  mesalamine (ASACOL) 400 MG EC tablet Take 400 mg by mouth 2 (two) times daily.   Yes Historical Provider, MD  omeprazole (PRILOSEC) 40 MG capsule Take 40 mg by mouth daily.   Yes Historical Provider, MD  ondansetron (ZOFRAN ODT) 4 MG disintegrating tablet Take 1 tablet (4 mg total) by mouth every 8 (eight) hours as needed for nausea. 06/04/12  Yes Perlie Mayo, MD  potassium chloride (K-DUR) 10 MEQ tablet Take 2 tablets (20 mEq total) by mouth 2 (two) times daily. 06/04/12  Yes Perlie Mayo, MD  predniSONE (DELTASONE) 5 MG tablet Take 5 mg by mouth daily.   Yes Historical Provider, MD  promethazine (PHENERGAN) 25 MG tablet Take 1 tablet (25 mg total) by mouth every 6 (six) hours as needed for nausea. 06/04/12  Yes Perlie Mayo, MD  risedronate (ACTONEL) 35 MG tablet Take 35 mg by mouth every 7 (seven) days. with water on empty stomach, nothing by mouth or lie down for next 30 minutes. Takes on Monday   Yes  Historical Provider, MD  vitamin B-12 (CYANOCOBALAMIN) 1000 MCG tablet Take 1,000 mcg by mouth daily.   Yes Historical Provider, MD    Inpatient medications:    . cefTRIAXone (ROCEPHIN)  IV  1 g Intravenous Q24H  . enoxaparin (LOVENOX) injection  30 mg Subcutaneous Q24H  . folic acid  1 mg Oral Daily  . hydrocortisone sod succinate (SOLU-CORTEF) injection  50 mg Intravenous Q8H  . Mesalamine  400 mg Oral BID  . pantoprazole (PROTONIX) IV  40 mg Intravenous Q12H  . sodium chloride  3 mL Intravenous Q12H  . vitamin B-12  1,000 mcg Oral Daily    Labs: Basic Metabolic Panel:  Lab 48/54/62  0600 06/07/12 1210 06/03/12 2014 06/03/12 1910  NA 133* 138 139 140  K 2.1* 2.5* 2.4* 2.1*  CL 104 110 107 107  CO2 10* 10* 18* 18*  GLUCOSE 115* 66* 110* 114*  BUN 69* 60* 28* 27*  CREATININE 10.49* 9.21* 2.68* 2.77*  ALB -- -- -- --  CALCIUM 6.5* 7.1* 9.0 9.1  PHOS -- -- -- --   Liver Function Tests:  Lab 06/07/12 1210 06/03/12 2014  AST 11 15  ALT 5 8  ALKPHOS 166* 185*  BILITOT 0.3 0.5  PROT 6.3 7.5  ALBUMIN 2.6* 3.3*    Lab 06/07/12 1210  LIPASE 39  AMYLASE --   No results found for this basename: AMMONIA:3 in the last 168 hours CBC:  Lab 06/07/12 1210 06/03/12 2014 06/03/12 1910  WBC 8.8 8.7 10.3  NEUTROABS 6.4 -- --  HGB 13.6 14.6 14.9  HCT 41.1 45.4 44.5  MCV 96.7 96.8 96.1  PLT 137* 132* 144*   PT/INR: @labrcntip (inr:5) Cardiac Enzymes: No results found for this basename: CKTOTAL:5,CKMB:5,CKMBINDEX:5,TROPONINI:5 in the last 168 hours CBG: No results found for this basename: GLUCAP:5 in the last 168 hours  Iron Studies: No results found for this basename: IRON:30,TIBC:30,TRANSFERRIN:30,FERRITIN:30 in the last 168 hours  Xrays/Other Studies: Ct Abdomen Pelvis Wo Contrast  06/07/2012  *RADIOLOGY REPORT*  Clinical Data: Chronic renal insufficiency, Crohn's disease, nausea, vomiting.  CT ABDOMEN AND PELVIS WITHOUT CONTRAST  Technique:  Multidetector CT imaging of the abdomen and pelvis was performed following the standard protocol without intravenous contrast.  Comparison: 07/04/2011  Findings: The visualized lung bases clear.  Vascular clips in the gallbladder fossa.  Unremarkable uninfused evaluation of liver, spleen, adrenal glands, pancreas.  Bilateral nephrolithiasis, with scattered 2-3 mm stones bilaterally.  No hydronephrosis.  Stable exophytic 37m probable cyst from the lower pole left kidney.  No ureterectasis or ureteral calculus.  Urinary bladder is incompletely distended.  Patchy aortoiliac calcifications without aneurysm.  Stomach is nondistended.   Changes of partial right hemicolectomy. There are several fluid distended distal small bowel loops in the right Lateral abdomen, and an increased number of sub centimeter lymph nodes in the regional mesentery.  There is fluid distention of the sigmoid colon and rectum.  No ascites.  No free air. Mild spondylitic changes in the lower lumbar spine.  IMPRESSION:  1.  Fluid distention of distal small bowel loops just proximal to the anastomosis with the colon, with some mild circumferential wall thickening suggesting inflammatory bowel disease. Negative for abscess or perforation. 2.  Fluid distention of the sigmoid colon without without evidence of obstruction or other structural abnormality. 3.  Bilateral nephrolithiasis without ureteral calculus or hydronephrosis   Original Report Authenticated By: D. HWallace Going MD     Physical Exam:  Blood pressure 100/57, pulse 87, temperature 98.2 F (  36.8 C), temperature source Oral, resp. rate 16, height 5' 5"  (1.651 m), weight 52.9 kg (116 lb 10 oz), SpO2 100.00%.  Gen: alert, older adult female, no distress Skin: no rash, cyanosis HEENT:  EOMI, sclera anicteric, throat dry Neck: no JVD, no LAN Chest: clear bilat, no rales or rhonchi CV: regular, no rub or gallop, no murmur, no carotid or femoral bruits, pedal pulses intact Abdomen: soft, nontender, no ascites or HSM Ext: no LE or UE edema, skin turgor intact, no joint effusion or deformity, no gangrene or ulceration Neuro: alert, Ox3, no focal deficit, no asterixis  UA- modLE, large bld, 11-20 wbc, 3-6 rbc, many bact/epis, +gran casts CT abd (oral contrast only) > no hydro  Impression/Plan 1. Acute on CRF- creat was baseline (2.7) 1 week ago, today is up to 10 in patient with week long GI illness (n/v/d and myalgias). Suspect this is volume related +/- ATN and should respond to fluids. Will increase IVF"s including 2 L bolus now, there is room for volume. Also check UNa, and Ucreat. Does not need renal US  with CT results from today. 2. 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 Crohn's disease, hx abd surg in 1980's, steroid-dependent 4. Pyuria, r/o UTI; per primary  Will follow   Kelly Splinter  MD [place] [phone] pgr    [phone] cell 06/08/2012, 5:02 PM

## 2012-06-08 NOTE — Progress Notes (Signed)
On call nephrologist notified of low UOP. Instructed to continue fluids as ordered since no signs of respiratory distress. No further orders obtained at this time.

## 2012-06-08 NOTE — Progress Notes (Signed)
[id number] Rosana Hoes, RN, BSN, CCM:  CHART REVIEWED AND UPDATED.  Next chart review due on [id number]. NO DISCHARGE NEEDS PRESENT AT THIS TIME. CASE MANAGEMENT [phone]

## 2012-06-08 NOTE — Progress Notes (Signed)
INITIAL NUTRITION ASSESSMENT  DOCUMENTATION CODES Per approved criteria  -Not Applicable   INTERVENTION: 1.  Modify diet; diet advancement as tolerated 2.  Nutrition-related medication; consider florastor daily  NUTRITION DIAGNOSIS: Altered GI function; vomiting/diarrhea related to unknown etiology as evidenced by pt with >3 episode of vomiting and stool per day.   Monitor:  1. Food/Beverage; diet advancement with tolerance 2.  Gastrointestinal; monitor output 3. Wt/wt change; monitor trends.  Reason for Assessment: MST  67 y.o. female  Admitting Dx: nausea, vomiting  ASSESSMENT: Pt admitted with nausea, vomiting, diarrhea for several days.  Pt was recently treated for possible flu, however current cultures negative for flu and c. Diff.  Pt with h/o Crohns disease who states her last flare was in 2013 (date unknown).  She states she was dx in 1978 and has been on a chronic regimen since that time.  She reports her current symptoms are similar to her previous Crohn's flares in that she has diarrhea, nausea, and soreness, however she reports typically her symptoms are not this severe and she does not usually have vomiting.   At baseline, pt eats a regular diet and tolerates foods well.  She denies avoiding or including specific foods in diet. Creatinine elevated on admission due to dehydration.  Wt status also likely affected by dehydration.  Will follow for usual wt.  Height: Ht Readings from Last 1 Encounters:  06/07/12 5' 5"  (1.651 m)    Weight: Wt Readings from Last 1 Encounters:  06/07/12 116 lb 10 oz (52.9 kg)    Ideal Body Weight: 125 lbs  % Ideal Body Weight: 92%  Wt Readings from Last 10 Encounters:  06/07/12 116 lb 10 oz (52.9 kg)  03/04/12 127 lb (57.607 kg)  03/04/12 127 lb (57.607 kg)    Usual Body Weight: 125-127 lbs  % Usual Body Weight: 92%  BMI:  Body mass index is 19.41 kg/(m^2).  Estimated Nutritional Needs: Kcal: 7782-4235 Protein:  55-62g Fluid: >1.5 L/day  Skin: intact, wnl  Diet Order: NPO  EDUCATION NEEDS: -Education needs addressed   Intake/Output Summary (Last 24 hours) at 06/08/12 1143 Last data filed at 06/08/12 1100  Gross per 24 hour  Intake   1950 ml  Output    150 ml  Net   1800 ml    Last BM: 1/13  Labs:   Lab 06/08/12 0600 06/07/12 1210 06/03/12 2014  NA 133* 138 139  K 2.1* 2.5* 2.4*  CL 104 110 107  CO2 10* 10* 18*  BUN 69* 60* 28*  CREATININE 10.49* 9.21* 2.68*  CALCIUM 6.5* 7.1* 9.0  MG -- -- --  PHOS -- -- --  GLUCOSE 115* 66* 110*    CBG (last 3)  No results found for this basename: GLUCAP:3 in the last 72 hours  Scheduled Meds:   . cefTRIAXone (ROCEPHIN)  IV  1 g Intravenous Q24H  . enoxaparin (LOVENOX) injection  30 mg Subcutaneous Q24H  . folic acid  1 mg Oral Daily  . hydrocortisone sod succinate (SOLU-CORTEF) injection  50 mg Intravenous Q8H  . Mesalamine  400 mg Oral BID  . pantoprazole (PROTONIX) IV  40 mg Intravenous Q12H  . potassium chloride  10 mEq Intravenous Q1 Hr x 6  . sodium chloride  3 mL Intravenous Q12H  . vitamin B-12  1,000 mcg Oral Daily    Continuous Infusions:   .  sodium bicarbonate infusion 1000 mL 100 mL/hr at 06/08/12 3614    Past Medical History  Diagnosis Date  . Chronic kidney disease   . GERD (gastroesophageal reflux disease)   . Crohn's colitis     Past Surgical History  Procedure Date  . Colon resection   . Esophagogastroduodenoscopy 03/04/2012    Procedure: ESOPHAGOGASTRODUODENOSCOPY (EGD);  Surgeon: Garlan Fair, MD;  Location: [facility];  Service: Endoscopy;  Laterality: N/A;  . Flexible sigmoidoscopy 03/04/2012    Procedure: FLEXIBLE SIGMOIDOSCOPY;  Surgeon: Garlan Fair, MD;  Location: [facility];  Service: Endoscopy;  Laterality: N/A;    Brynda Greathouse, MS RD LDN Clinical Inpatient Dietitian Pager: [phone] Weekend/After hours pager: [phone]

## 2012-06-08 NOTE — Progress Notes (Signed)
CRITICAL VALUE ALERT  Critical value received:  K+ 2.1   CO2 10  Date of notification:  06/08/12  Time of notification:  0646  Critical value read back:yes  Nurse who received alert: cassie  MD notified (1st page):  Dr Bernell List text paged    Time of first page:  [phone]  MD notified (2nd page):  Time of second page:  Responding MD:    Time MD responded:

## 2012-06-08 NOTE — Progress Notes (Signed)
[facility] PROGRESS NOTE  Natasha [name] PXT:[id number] DOB: [birth year] DOA: 06/07/2012 PCP: Garlan Fair, MD  Assessment/Plan: Nausea/ vomiting/Diarrhea  -As discussed above, and patient with Crohn's disease  -C. difficile negative -She is on chronic steroids for her Crohn's >> stress dose steroids and follow up on cortisol level, then take her up back to prednisone when appropriate..  -CT scan of abdomen and pelvis with oral contrast only-shows mild circumferential wall thickening suggesting inflammatory bowel disease. Negative for abscess or perforation.  -Improving today, her Gastroenterologist is Dr. Earle Gell follow and consult pending clinical course Hypotension  -Likely secondary to hypovolemia due to #1, she is also on chronic prednisone so covering with stress dose steroids as above versus infection  -Continue Hydration with IV fluids  -cortisol level pending, continue stress dose steroids as above.  -Her urinalysis is consistent with a UTI, continue empiric antibiotics with Rocephin and follow up on blood and urine cultures Acute on chronic renal failure  -As discussed above her last creatinine on 1/9 was 2.68, and up to 9.21on 1/13 and worsening today  - Dehydration, hypotension, UTI, likely contributing factors  - Dr. Jonnie Finner consulted and to see for further recommendations -Continue Hydrate with bicarbonate added to fluids, follow recheck.  -Treating urinary tract infection as below.  Dehydration  -Hydration with IV fluids follow and recheck.  Probable urinary tract infection  -continue empiric antibiotics with Rocephin and follow cultures.  Crohn's disease  -Continue outpatient meds, stress dose steroids as above and follow.  Metabolic acidosis  -Secondary to acute on chronic renal failure, placed on bicarbonate- follow and recheck.  Hypokalemia  -Secondary to GI losses and patient on bicarbonate, replace potassium  Flulike symptoms  -influenza panel  negative, will DC Tamiflu.  Code Status: full Family Communication: directly with pt at bedside Disposition Plan: Home when medically ready   Consultants:  Renal-evaluation pending  Procedures:  none  Antibiotics:  Rocephin started on 1/13  HPI/Subjective: States and decreased diarrhea today no further nausea or vomiting  Objective: Filed Vitals:   06/08/12 1500 06/08/12 1600 06/08/12 1700 06/08/12 1800  BP:  100/57  94/59  Pulse: 89 87 83 95  Temp:  98.1 F (36.7 C)    TempSrc:  Oral    Resp:      Height:      Weight:      SpO2: 100% 100% 100% 99%    Intake/Output Summary (Last 24 hours) at 06/08/12 1903 Last data filed at 06/08/12 1600  Gross per 24 hour  Intake   2390 ml  Output    205 ml  Net   2185 ml   Filed Weights   06/07/12 1549  Weight: 52.9 kg (116 lb 10 oz)    Exam:   General: In no apparent distress, alert and oriented x3  Cardiovascular: Regular rate and rhythm, S1-S2  Respiratory: Clear to auscultation bilaterally, no crackles or wheezes  Abdomen: Soft, bowel sounds present, decreased tenderness, no rebound tenderness no masses palpable.  Extremities: No cyanosis and no edema  Data Reviewed: Basic Metabolic Panel:  Lab 62/70/35 0600 06/07/12 1210 06/03/12 2014 06/03/12 1910  NA 133* 138 139 140  K 2.1* 2.5* 2.4* 2.1*  CL 104 110 107 107  CO2 10* 10* 18* 18*  GLUCOSE 115* 66* 110* 114*  BUN 69* 60* 28* 27*  CREATININE 10.49* 9.21* 2.68* 2.77*  CALCIUM 6.5* 7.1* 9.0 9.1  MG -- -- -- --  PHOS -- -- -- --   Liver  Function Tests:  Lab 06/07/12 1210 06/03/12 2014  AST 11 15  ALT 5 8  ALKPHOS 166* 185*  BILITOT 0.3 0.5  PROT 6.3 7.5  ALBUMIN 2.6* 3.3*    Lab 06/07/12 1210  LIPASE 39  AMYLASE --   No results found for this basename: AMMONIA:5 in the last 168 hours CBC:  Lab 06/07/12 1210 06/03/12 2014 06/03/12 1910  WBC 8.8 8.7 10.3  NEUTROABS 6.4 -- --  HGB 13.6 14.6 14.9  HCT 41.1 45.4 44.5  MCV 96.7 96.8 96.1   PLT 137* 132* 144*   Cardiac Enzymes: No results found for this basename: CKTOTAL:5,CKMB:5,CKMBINDEX:5,TROPONINI:5 in the last 168 hours BNP (last 3 results) No results found for this basename: PROBNP:3 in the last 8760 hours CBG: No results found for this basename: GLUCAP:5 in the last 168 hours  Recent Results (from the past 240 hour(s))  URINE CULTURE     Status: Normal   Collection Time   06/07/12 12:25 PM      Component Value Range Status Comment   Specimen Description URINE, CLEAN CATCH   Final    Special Requests NONE   Final    Culture  Setup Time 06/07/2012 22:26   Final    Colony Count 50,000 COLONIES/ML   Final    Culture     Final    Value: Multiple bacterial morphotypes present, none predominant. Suggest appropriate recollection if clinically indicated.   Report Status 06/08/2012 FINAL   Final   MRSA PCR SCREENING     Status: Normal   Collection Time   06/07/12  3:28 PM      Component Value Range Status Comment   MRSA by PCR NEGATIVE  NEGATIVE Final   CLOSTRIDIUM DIFFICILE BY PCR     Status: Normal   Collection Time   06/07/12 10:37 PM      Component Value Range Status Comment   C difficile by pcr NEGATIVE  NEGATIVE Final   STOOL CULTURE     Status: Normal (Preliminary result)   Collection Time   06/07/12 10:38 PM      Component Value Range Status Comment   Specimen Description STOOL   Final    Special Requests NONE   Final    Culture Culture reincubated for better growth   Final    Report Status PENDING   Incomplete      Studies: Ct Abdomen Pelvis Wo Contrast  06/07/2012  *RADIOLOGY REPORT*  Clinical Data: Chronic renal insufficiency, Crohn's disease, nausea, vomiting.  CT ABDOMEN AND PELVIS WITHOUT CONTRAST  Technique:  Multidetector CT imaging of the abdomen and pelvis was performed following the standard protocol without intravenous contrast.  Comparison: 07/04/2011  Findings: The visualized lung bases clear.  Vascular clips in the gallbladder fossa.   Unremarkable uninfused evaluation of liver, spleen, adrenal glands, pancreas.  Bilateral nephrolithiasis, with scattered 2-3 mm stones bilaterally.  No hydronephrosis.  Stable exophytic 64m probable cyst from the lower pole left kidney.  No ureterectasis or ureteral calculus.  Urinary bladder is incompletely distended.  Patchy aortoiliac calcifications without aneurysm.  Stomach is nondistended.  Changes of partial right hemicolectomy. There are several fluid distended distal small bowel loops in the right Lateral abdomen, and an increased number of sub centimeter lymph nodes in the regional mesentery.  There is fluid distention of the sigmoid colon and rectum.  No ascites.  No free air. Mild spondylitic changes in the lower lumbar spine.  IMPRESSION:  1.  Fluid distention of distal small  bowel loops just proximal to the anastomosis with the colon, with some mild circumferential wall thickening suggesting inflammatory bowel disease. Negative for abscess or perforation. 2.  Fluid distention of the sigmoid colon without without evidence of obstruction or other structural abnormality. 3.  Bilateral nephrolithiasis without ureteral calculus or hydronephrosis   Original Report Authenticated By: D. Wallace Going, MD     Scheduled Meds:   . cefTRIAXone (ROCEPHIN)  IV  1 g Intravenous Q24H  . enoxaparin (LOVENOX) injection  30 mg Subcutaneous Q24H  . folic acid  1 mg Oral Daily  . hydrocortisone sod succinate (SOLU-CORTEF) injection  50 mg Intravenous Q8H  . Mesalamine  400 mg Oral BID  . pantoprazole (PROTONIX) IV  40 mg Intravenous Q12H  . sodium chloride  2,000 mL Intravenous Once  . sodium chloride  3 mL Intravenous Q12H  . vitamin B-12  1,000 mcg Oral Daily   Continuous Infusions:   .  sodium bicarbonate infusion 1000 mL 200 mL/hr at 06/08/12 1810    Active Problems:  Acute renal failure  Dehydration  Crohn's disease  Metabolic acidosis  Hypokalemia  Diarrhea  UTI (lower urinary tract  infection)    Time spent:35    [facility] Pager [phone]. If 8PM-8AM, please contact night-coverage at [email or web address], password [facility] 06/08/2012, 7:03 PM  LOS: 1 day

## 2012-06-09 ENCOUNTER — Encounter (HOSPITAL_COMMUNITY): Payer: Self-pay | Admitting: Internal Medicine

## 2012-06-09 DIAGNOSIS — D638 Anemia in other chronic diseases classified elsewhere: Secondary | ICD-10-CM | POA: Diagnosis not present

## 2012-06-09 DIAGNOSIS — D649 Anemia, unspecified: Secondary | ICD-10-CM

## 2012-06-09 DIAGNOSIS — N184 Chronic kidney disease, stage 4 (severe): Secondary | ICD-10-CM | POA: Diagnosis present

## 2012-06-09 DIAGNOSIS — N186 End stage renal disease: Secondary | ICD-10-CM | POA: Diagnosis present

## 2012-06-09 LAB — BASIC METABOLIC PANEL
CO2: 21 mEq/L (ref 19–32)
Calcium: 6.1 mg/dL — CL (ref 8.4–10.5)
Chloride: 95 mEq/L — ABNORMAL LOW (ref 96–112)
Glucose, Bld: 132 mg/dL — ABNORMAL HIGH (ref 70–99)
Sodium: 132 mEq/L — ABNORMAL LOW (ref 135–145)

## 2012-06-09 LAB — RENAL FUNCTION PANEL
CO2: 15 mEq/L — ABNORMAL LOW (ref 19–32)
Calcium: 5.5 mg/dL — CL (ref 8.4–10.5)
GFR calc Af Amer: 4 mL/min — ABNORMAL LOW (ref >90)
GFR calc non Af Amer: 3 mL/min — ABNORMAL LOW (ref >90)
Glucose, Bld: 133 mg/dL — ABNORMAL HIGH (ref 70–99)
Phosphorus: 2.5 mg/dL (ref 2.3–4.6)
Potassium: <2 mEq/L — CL (ref 3.5–5.1)
Sodium: 136 mEq/L (ref 135–145)

## 2012-06-09 LAB — TSH: TSH: 0.983 u[IU]/mL (ref 0.350–4.500)

## 2012-06-09 LAB — CORTISOL-AM, BLOOD: Cortisol - AM: 47.8 ug/dL — ABNORMAL HIGH (ref 4.3–22.4)

## 2012-06-09 LAB — CBC
HCT: 27.9 % — ABNORMAL LOW (ref 36.0–46.0)
Hemoglobin: 9.8 g/dL — ABNORMAL LOW (ref 12.0–15.0)
MCHC: 35.1 g/dL (ref 30.0–36.0)
RDW: 14.3 % (ref 11.5–15.5)
WBC: 7.6 10*3/uL (ref 4.0–10.5)

## 2012-06-09 LAB — MAGNESIUM
Magnesium: 0.8 mg/dL — CL (ref 1.5–2.5)
Magnesium: 2.1 mg/dL (ref 1.5–2.5)

## 2012-06-09 LAB — IRON AND TIBC
Saturation Ratios: 85 % — ABNORMAL HIGH (ref 20–55)
TIBC: 151 ug/dL — ABNORMAL LOW (ref 250–470)

## 2012-06-09 LAB — VITAMIN B12: Vitamin B-12: >2000 pg/mL — ABNORMAL HIGH (ref 211–911)

## 2012-06-09 LAB — FERRITIN: Ferritin: 331 ng/mL — ABNORMAL HIGH (ref 10–291)

## 2012-06-09 MED ORDER — PROMETHAZINE HCL 25 MG/ML IJ SOLN
12.5000 mg | Freq: Once | INTRAMUSCULAR | Status: AC
  Administered 2012-06-09: 12.5 mg via INTRAVENOUS
  Filled 2012-06-09: qty 1

## 2012-06-09 MED ORDER — MAGNESIUM SULFATE 40 MG/ML IJ SOLN
4.0000 g | Freq: Once | INTRAMUSCULAR | Status: DC
  Filled 2012-06-09: qty 100

## 2012-06-09 MED ORDER — MAGNESIUM SULFATE 40 MG/ML IJ SOLN
4.0000 g | Freq: Once | INTRAMUSCULAR | Status: AC
  Administered 2012-06-09: 4 g via INTRAVENOUS
  Filled 2012-06-09: qty 100

## 2012-06-09 MED ORDER — CALCIUM CITRATE 950 (200 CA) MG PO TABS
500.0000 mg | ORAL_TABLET | Freq: Four times a day (QID) | ORAL | Status: DC
  Administered 2012-06-09: 500 mg via ORAL
  Filled 2012-06-09: qty 3

## 2012-06-09 MED ORDER — MAGNESIUM SULFATE 40 MG/ML IJ SOLN: 4.0000 g | Freq: Once | INTRAMUSCULAR | Status: DC

## 2012-06-09 MED ORDER — SODIUM CHLORIDE 0.9 % IV BOLUS (SEPSIS)
250.0000 mL | Freq: Once | INTRAVENOUS | Status: AC
  Administered 2012-06-09: 250 mL via INTRAVENOUS

## 2012-06-09 MED ORDER — POTASSIUM CHLORIDE CRYS ER 20 MEQ PO TBCR
40.0000 meq | EXTENDED_RELEASE_TABLET | ORAL | Status: AC
  Filled 2012-06-09: qty 2

## 2012-06-09 MED ORDER — SODIUM CHLORIDE 0.9 % IV SOLN
4.0000 g | Freq: Once | INTRAVENOUS | Status: AC
  Administered 2012-06-09: 4 g via INTRAVENOUS
  Filled 2012-06-09: qty 40

## 2012-06-09 MED ORDER — POTASSIUM CHLORIDE 10 MEQ/100ML IV SOLN
10.0000 meq | INTRAVENOUS | Status: AC
  Administered 2012-06-09 – 2012-06-10 (×6): 10 meq via INTRAVENOUS
  Filled 2012-06-09: qty 600

## 2012-06-09 MED ORDER — POTASSIUM CHLORIDE 10 MEQ/100ML IV SOLN
10.0000 meq | INTRAVENOUS | Status: AC
  Administered 2012-06-09 (×4): 10 meq via INTRAVENOUS
  Filled 2012-06-09 (×4): qty 100

## 2012-06-09 MED ORDER — PROMETHAZINE HCL 25 MG/ML IJ SOLN
12.5000 mg | Freq: Four times a day (QID) | INTRAMUSCULAR | Status: DC | PRN
  Administered 2012-06-09 – 2012-06-13 (×4): 12.5 mg via INTRAVENOUS
  Filled 2012-06-09 (×5): qty 1

## 2012-06-09 MED ORDER — POTASSIUM CHLORIDE CRYS ER 20 MEQ PO TBCR
40.0000 meq | EXTENDED_RELEASE_TABLET | ORAL | Status: AC
  Administered 2012-06-09 (×3): 40 meq via ORAL
  Filled 2012-06-09 (×3): qty 2

## 2012-06-09 MED ORDER — POTASSIUM CHLORIDE CRYS ER 20 MEQ PO TBCR
40.0000 meq | EXTENDED_RELEASE_TABLET | Freq: Once | ORAL | Status: AC
  Administered 2012-06-09: 40 meq via ORAL
  Filled 2012-06-09: qty 2

## 2012-06-09 MED ORDER — POTASSIUM CHLORIDE CRYS ER 20 MEQ PO TBCR: 40.0000 meq | EXTENDED_RELEASE_TABLET | Freq: Once | ORAL | Status: AC

## 2012-06-09 NOTE — Progress Notes (Signed)
[facility] PROGRESS NOTE  Natasha [name] JJK:[id number] DOB: 12/25/1945 DOA: 06/07/2012 PCP: Garlan Fair, MD  Assessment/Plan:  Nausea/ vomiting/Diarrhea  Clinical improvement. Patient states was taken off asacol. -Patient with Crohn's disease  -C. difficile negative  -She is on chronic steroids for her Crohn's >> stress dose steroids and follow up on cortisol level, then take her up back to prednisone when appropriate..  -CT scan of abdomen and pelvis with oral contrast only-shows mild circumferential wall thickening suggesting inflammatory bowel disease. Negative for abscess or perforation.  -Improving daily, her Gastroenterologist is Dr. Earle Gell follow and consult pending clinical course  Will d/c asacol, as patient states was taken off this per her gastroenterologist. Hypotension  -Likely secondary to hypovolemia due to #1, she is also on chronic prednisone so covering with stress dose steroids as above versus infection  -Continue Hydration with IV fluids  -cortisol level pending, continue stress dose steroids as above.  -Her urinalysis is consistent with a UTI, continue empiric antibiotics with Rocephin and follow up on blood and urine cultures  Acute on chronic renal failure(CKD 4) baseline 2-3 -Patient's last creatinine on 1/9 was 2.68, and up to 9.21on 1/13 and worsening today at 10.59 - Dehydration, hypotension, UTI, likely contributing factors, possibly ATN  Patient with decreased urine output of 268 cc over the past 24 hours - Dr. Jonnie Finner consulted, and due to CT scan being negative for hydronephrosis no renal ultrasound is needed at this time. Patient's IV fluids have been increased with sodium bicarbonate per renal. Renal following and appreciate input and recommendations.  Dehydration  -Hydration with IV fluids follow and recheck.  Probable urinary tract infection  -Urine cultures with 50,000 colonies of multiple bacterial morphotypes. Patient was  started on Rocephin on 06/07/2012. Will treat for one more day for total of 3 days. Crohn's disease  -Patient with some clinical improvement. Patient states that Asacol was discontinued per her gastroenterologist. Will D/C her Asacol. Continue stress dose steroids.  Metabolic acidosis  -Secondary to acute on chronic renal failure, placed on bicarbonate- follow and recheck. Per renal Severe Hypokalemia  -Secondary to GI losses. Patient does have a significant hypomagnesemia with a magnesium level of 0.8. Will replete magnesium. We'll give 3 doses of Kdur 40 mEq each. Repeat basic metabolic profile later on this afternoon. Continue on bicarbonate replacement. Follow.  Severe hypomagnesemia Likely secondary to GI losses. Replete. Anemia Patient with no overt GI bleed. Likely dilutional in nature and secondary to chronic kidney disease. Will check an anemia panel. Follow H&H. Flulike symptoms  -influenza panel negative,  Tamiflu d/c'd yesterday.   Code Status: Full Family Communication: Updated patient at bedside. No family present. Disposition Plan: Home when medically stable.   Consultants:  Renal: Dr. Jonnie Finner 06/08/2012  Procedures:  CT abdomen and pelvis 06/07/2012  Chest x-ray 06/03/2012  Antibiotics:  IV Rocephin 06/07/2012  HPI/Subjective: Patient stated that had a bad episode of nausea yesterday after being started on Asacol. Patient states her gastroenterologist and her nephrologist had discontinued the Asacol as of August of 2013. Patient denies any emesis today. Patient denies any diarrhea.  Objective: Filed Vitals:   06/09/12 0200 06/09/12 0400 06/09/12 0600 06/09/12 0800  BP: 108/68 99/51 103/61   Pulse:  68    Temp:  98.1 F (36.7 C)  97.6 F (36.4 C)  TempSrc:  Oral  Oral  Resp:  14    Height:      Weight:  57.8 kg (127 lb 6.8 oz)  SpO2:  99%      Intake/Output Summary (Last 24 hours) at 06/09/12 0822 Last data filed at 06/09/12 0658  Gross per 24  hour  Intake   3394 ml  Output    269 ml  Net   3125 ml   Filed Weights   06/07/12 1549 06/09/12 0400  Weight: 52.9 kg (116 lb 10 oz) 57.8 kg (127 lb 6.8 oz)    Exam:   General:  NAD  Cardiovascular: RRR no m/r/g. No JVD, No LE edema  Respiratory: CTAB  Abdomen: Soft/NT/ND/+BS  Data Reviewed: Basic Metabolic Panel:  Lab 28/36/62 0325 06/08/12 0600 06/07/12 1210 06/03/12 2014 06/03/12 1910  NA 136 133* 138 139 140  K <2.0* 2.1* 2.5* 2.4* 2.1*  CL 105 104 110 107 107  CO2 15* 10* 10* 18* 18*  GLUCOSE 133* 115* 66* 110* 114*  BUN 67* 69* 60* 28* 27*  CREATININE 10.59* 10.49* 9.21* 2.68* 2.77*  CALCIUM 5.5* 6.5* 7.1* 9.0 9.1  MG 0.8* -- -- -- --  PHOS 2.5 -- -- -- --   Liver Function Tests:  Lab 06/09/12 0325 06/07/12 1210 06/03/12 2014  AST -- 11 15  ALT -- 5 8  ALKPHOS -- 166* 185*  BILITOT -- 0.3 0.5  PROT -- 6.3 7.5  ALBUMIN 1.8* 2.6* 3.3*    Lab 06/07/12 1210  LIPASE 39  AMYLASE --   No results found for this basename: AMMONIA:5 in the last 168 hours CBC:  Lab 06/09/12 0325 06/07/12 1210 06/03/12 2014 06/03/12 1910  WBC 7.6 8.8 8.7 10.3  NEUTROABS -- 6.4 -- --  HGB 9.8* 13.6 14.6 14.9  HCT 27.9* 41.1 45.4 44.5  MCV 90.9 96.7 96.8 96.1  PLT 120* 137* 132* 144*   Cardiac Enzymes: No results found for this basename: CKTOTAL:5,CKMB:5,CKMBINDEX:5,TROPONINI:5 in the last 168 hours BNP (last 3 results) No results found for this basename: PROBNP:3 in the last 8760 hours CBG: No results found for this basename: GLUCAP:5 in the last 168 hours  Recent Results (from the past 240 hour(s))  URINE CULTURE     Status: Normal   Collection Time   06/07/12 12:25 PM      Component Value Range Status Comment   Specimen Description URINE, CLEAN CATCH   Final    Special Requests NONE   Final    Culture  Setup Time 06/07/2012 22:26   Final    Colony Count 50,000 COLONIES/ML   Final    Culture     Final    Value: Multiple bacterial morphotypes present, none  predominant. Suggest appropriate recollection if clinically indicated.   Report Status 06/08/2012 FINAL   Final   MRSA PCR SCREENING     Status: Normal   Collection Time   06/07/12  3:28 PM      Component Value Range Status Comment   MRSA by PCR NEGATIVE  NEGATIVE Final   CULTURE, BLOOD (ROUTINE X 2)     Status: Normal (Preliminary result)   Collection Time   06/07/12 10:35 PM      Component Value Range Status Comment   Specimen Description BLOOD LEFT ANTECUBITAL   Final    Special Requests BOTTLES DRAWN AEROBIC AND ANAEROBIC 3CC   Final    Culture  Setup Time 06/08/2012 07:53   Final    Culture     Final    Value:        BLOOD CULTURE RECEIVED NO GROWTH TO DATE CULTURE WILL BE HELD FOR 5  DAYS BEFORE ISSUING A FINAL NEGATIVE REPORT   Report Status PENDING   Incomplete   CLOSTRIDIUM DIFFICILE BY PCR     Status: Normal   Collection Time   06/07/12 10:37 PM      Component Value Range Status Comment   C difficile by pcr NEGATIVE  NEGATIVE Final   STOOL CULTURE     Status: Normal (Preliminary result)   Collection Time   06/07/12 10:38 PM      Component Value Range Status Comment   Specimen Description STOOL   Final    Special Requests NONE   Final    Culture Culture reincubated for better growth   Final    Report Status PENDING   Incomplete   CULTURE, BLOOD (ROUTINE X 2)     Status: Normal (Preliminary result)   Collection Time   06/07/12 10:45 PM      Component Value Range Status Comment   Specimen Description BLOOD RIGHT HAND   Final    Special Requests BOTTLES DRAWN AEROBIC ONLY 1.5CC   Final    Culture  Setup Time 06/08/2012 07:54   Final    Culture     Final    Value:        BLOOD CULTURE RECEIVED NO GROWTH TO DATE CULTURE WILL BE HELD FOR 5 DAYS BEFORE ISSUING A FINAL NEGATIVE REPORT   Report Status PENDING   Incomplete      Studies: Ct Abdomen Pelvis Wo Contrast  06/07/2012  *RADIOLOGY REPORT*  Clinical Data: Chronic renal insufficiency, Crohn's disease, nausea, vomiting.  CT  ABDOMEN AND PELVIS WITHOUT CONTRAST  Technique:  Multidetector CT imaging of the abdomen and pelvis was performed following the standard protocol without intravenous contrast.  Comparison: 07/04/2011  Findings: The visualized lung bases clear.  Vascular clips in the gallbladder fossa.  Unremarkable uninfused evaluation of liver, spleen, adrenal glands, pancreas.  Bilateral nephrolithiasis, with scattered 2-3 mm stones bilaterally.  No hydronephrosis.  Stable exophytic 72m probable cyst from the lower pole left kidney.  No ureterectasis or ureteral calculus.  Urinary bladder is incompletely distended.  Patchy aortoiliac calcifications without aneurysm.  Stomach is nondistended.  Changes of partial right hemicolectomy. There are several fluid distended distal small bowel loops in the right Lateral abdomen, and an increased number of sub centimeter lymph nodes in the regional mesentery.  There is fluid distention of the sigmoid colon and rectum.  No ascites.  No free air. Mild spondylitic changes in the lower lumbar spine.  IMPRESSION:  1.  Fluid distention of distal small bowel loops just proximal to the anastomosis with the colon, with some mild circumferential wall thickening suggesting inflammatory bowel disease. Negative for abscess or perforation. 2.  Fluid distention of the sigmoid colon without without evidence of obstruction or other structural abnormality. 3.  Bilateral nephrolithiasis without ureteral calculus or hydronephrosis   Original Report Authenticated By: D. HWallace Going MD     Scheduled Meds:   . cefTRIAXone (ROCEPHIN)  IV  1 g Intravenous Q24H  . enoxaparin (LOVENOX) injection  30 mg Subcutaneous Q24H  . folic acid  1 mg Oral Daily  . hydrocortisone sod succinate (SOLU-CORTEF) injection  50 mg Intravenous Q8H  . levothyroxine  125 mcg Oral QAC breakfast  . pantoprazole (PROTONIX) IV  40 mg Intravenous Q12H  . potassium chloride  10 mEq Intravenous Q1 Hr x 4  . potassium chloride  40  mEq Oral Once  . potassium chloride  40 mEq Oral Q4H  .  sodium chloride  3 mL Intravenous Q12H  . vitamin A & D      . vitamin B-12  1,000 mcg Oral Daily   Continuous Infusions:   .  sodium bicarbonate infusion 1000 mL 200 mL/hr at 06/09/12 0809    Principal Problem:  *Acute on chronic kidney disease, stage 4 Active Problems:  Acute renal failure  Dehydration  Crohn's disease  Metabolic acidosis  Hypokalemia  Diarrhea  UTI (lower urinary tract infection)  Anemia  Hypomagnesemia  CKD (chronic kidney disease)    Time spent: > 40 mins    [place] [facility] Pager [phone]. If 8PM-8AM, please contact night-coverage at [email or web address], password [facility] 06/09/2012, 8:22 AM  LOS: 2 days

## 2012-06-09 NOTE — Progress Notes (Signed)
Patient with persistent poor urinary output despite increased IV fluid rate. Patient with persistent hypokalemia and hypomagnesemia and metabolic acidosis and worsening renal function. Patient is being followed by nephrology and I discussed patient's case with Dr. Jonnie Finner who feels patient would be best served being transferred to [facility] for further care, as patient may require hemodialysis soon, if no significant improvement in her renal function. Will transfer patient to the step down unit at [facility] under [facility] team #1.

## 2012-06-09 NOTE — Progress Notes (Signed)
PT Cancellation Note  Patient Details Name: Natasha [name] MRN: [id number] DOB: 1945-08-13   Cancelled Treatment:    Reason Eval/Treat Not Completed: Patient not medically ready Note pt is being transferred to [facility].  Will defer PT eval today.   Maudry [facility] 06/09/2012, 4:03 PM

## 2012-06-09 NOTE — Progress Notes (Signed)
MD paged regarding patient's urine output. From 1100-1500, patient has only had 62cc of urine output even with 250cc bolus.  Will continue to monitor.

## 2012-06-09 NOTE — Progress Notes (Signed)
Patient has only had 66cc of urine output from 0700-1100. MD was made aware and new orders received for a 250cc bolus and to monitor output closely.  Will continue to monitor.

## 2012-06-09 NOTE — Progress Notes (Signed)
Subjective: +N/V, no abd pain or SOB, minimal UOP about 15 cc/hr. Urine looks clearer in the bag, though.  Objective Vital signs in last 24 hours: Filed Vitals:   06/09/12 1100 06/09/12 1200 06/09/12 1400 06/09/12 1600  BP:  94/65 111/71   Pulse: 73 73 71   Temp:  97.6 F (36.4 C)  97.9 F (36.6 C)  TempSrc:  Oral  Oral  Resp: 18 16 15    Height:      Weight:      SpO2: 100% 100% 99%    Weight change: 4.9 kg (10 lb 12.8 oz)  Intake/Output Summary (Last 24 hours) at 06/09/12 1712 Last data filed at 06/09/12 1538  Gross per 24 hour  Intake   4414 ml  Output    205 ml  Net   4209 ml   Labs: Basic Metabolic Panel:  Lab 78/67/54 0325 06/08/12 0600 06/07/12 1210 06/03/12 2014 06/03/12 1910  NA 136 133* 138 139 140  K <2.0* 2.1* 2.5* 2.4* 2.1*  CL 105 104 110 107 107  CO2 15* 10* 10* 18* 18*  GLUCOSE 133* 115* 66* 110* 114*  BUN 67* 69* 60* 28* 27*  CREATININE 10.59* 10.49* 9.21* 2.68* 2.77*  ALB -- -- -- -- --  CALCIUM 5.5* 6.5* 7.1* 9.0 9.1  PHOS 2.5 -- -- -- --   Liver Function Tests:  Lab 06/09/12 0325 06/07/12 1210 06/03/12 2014  AST -- 11 15  ALT -- 5 8  ALKPHOS -- 166* 185*  BILITOT -- 0.3 0.5  PROT -- 6.3 7.5  ALBUMIN 1.8* 2.6* 3.3*    Lab 06/07/12 1210  LIPASE 39  AMYLASE --   No results found for this basename: AMMONIA:3 in the last 168 hours CBC:  Lab 06/09/12 0325 06/07/12 1210 06/03/12 2014 06/03/12 1910  WBC 7.6 8.8 8.7 10.3  NEUTROABS -- 6.4 -- --  HGB 9.8* 13.6 14.6 14.9  HCT 27.9* 41.1 45.4 44.5  MCV 90.9 96.7 96.8 96.1  PLT 120* 137* 132* 144*   PT/INR: @labrcntip (inr:5) Cardiac Enzymes: No results found for this basename: CKTOTAL:5,CKMB:5,CKMBINDEX:5,TROPONINI:5 in the last 168 hours CBG: No results found for this basename: GLUCAP:5 in the last 168 hours  Iron Studies:  Lab 06/09/12 0835  IRON 129  TIBC 151*  TRANSFERRIN --  FERRITIN 331*    Physical Exam:  Blood pressure 111/71, pulse 71, temperature 97.9 F (36.6 C),  temperature source Oral, resp. rate 15, height 5' 5"  (1.651 m), weight 57.8 kg (127 lb 6.8 oz), SpO2 99.00%.  Gen: alert, older adult female, no distress  Skin: no rash, cyanosis  HEENT: EOMI, sclera anicteric, throat dry  Neck: no JVD, no LAN  Chest: clear bilat, no rales or rhonchi  CV: regular, no rub or gallop, no murmur, no carotid or femoral bruits, pedal pulses intact  Abdomen: soft, nontender, no ascites or HSM  Ext: no LE or UE edema, skin turgor intact, no joint effusion or deformity, no gangrene or ulceration  Neuro: alert, Ox3, no focal deficit, no asterixis   UA- modLE, large bld, 11-20 wbc, 3-6 rbc, many bact/epis, +gran casts  CT abd (oral contrast only) > no hydro   Impression/Plan  1. Acute on CRF- creat was baseline (2.7) 1 week ago, now creat 10.5 with poor UOP despite high volume of IVF"s. No nephrotoxins, + 2 week hx of significant Crohn's related diarrhea and low BP's on admission, suspect vol depletion and probably ATN. Will decrease IVF"s to 125/hr, recommend Tx to [facility]  for possible RRT soon.  Discussed with pt and daughter and questions answered 2. 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 Crohn's disease, hx abd surg in 1980's, steroid-dependent 4. Pyuria, r/o UTI; per primary 5. Hypokalemia due to diarrhea- got 160 po KCL and 4 runs IV KCl today, repeat lab at 6pm 6. Hypomagnesemia due to diarrhea- got 4gm IV Mg today 7. Hypocalcemia due to advanced RF- ordered 4gm IV to be given [facility] [phone] pgr    [phone] cell 06/09/2012, 5:12 PM

## 2012-06-10 ENCOUNTER — Encounter (HOSPITAL_COMMUNITY): Payer: Self-pay | Admitting: Internal Medicine

## 2012-06-10 DIAGNOSIS — E039 Hypothyroidism, unspecified: Secondary | ICD-10-CM | POA: Diagnosis present

## 2012-06-10 LAB — BASIC METABOLIC PANEL
BUN: 61 mg/dL — ABNORMAL HIGH (ref 6–23)
CO2: 23 mEq/L (ref 19–32)
CO2: 27 mEq/L (ref 19–32)
Calcium: 7.4 mg/dL — ABNORMAL LOW (ref 8.4–10.5)
Chloride: 98 mEq/L (ref 96–112)
Chloride: 97 mEq/L (ref 96–112)
Creatinine, Ser: 9.69 mg/dL — ABNORMAL HIGH (ref 0.50–1.10)
GFR calc Af Amer: 4 mL/min — ABNORMAL LOW (ref >90)
Glucose, Bld: 132 mg/dL — ABNORMAL HIGH (ref 70–99)
Potassium: 2.9 mEq/L — ABNORMAL LOW (ref 3.5–5.1)
Potassium: 2.4 mEq/L — CL (ref 3.5–5.1)
Sodium: 134 mEq/L — ABNORMAL LOW (ref 135–145)
Sodium: 137 mEq/L (ref 135–145)

## 2012-06-10 LAB — CBC
HCT: 26.8 % — ABNORMAL LOW (ref 36.0–46.0)
Hemoglobin: 9.5 g/dL — ABNORMAL LOW (ref 12.0–15.0)
MCV: 89 fL (ref 78.0–100.0)
RBC: 3.01 MIL/uL — ABNORMAL LOW (ref 3.87–5.11)
RDW: 13.8 % (ref 11.5–15.5)
WBC: 9.1 10*3/uL (ref 4.0–10.5)

## 2012-06-10 LAB — MAGNESIUM
Magnesium: 1.9 mg/dL (ref 1.5–2.5)
Magnesium: 1.9 mg/dL (ref 1.5–2.5)

## 2012-06-10 MED ORDER — POTASSIUM CHLORIDE CRYS ER 20 MEQ PO TBCR
40.0000 meq | EXTENDED_RELEASE_TABLET | ORAL | Status: AC
  Administered 2012-06-10 – 2012-06-11 (×3): 40 meq via ORAL
  Filled 2012-06-10 (×2): qty 2

## 2012-06-10 MED ORDER — POTASSIUM CHLORIDE CRYS ER 20 MEQ PO TBCR
40.0000 meq | EXTENDED_RELEASE_TABLET | ORAL | Status: AC
  Administered 2012-06-10 (×2): 40 meq via ORAL
  Filled 2012-06-10 (×2): qty 2

## 2012-06-10 MED ORDER — LOPERAMIDE HCL 2 MG PO CAPS
2.0000 mg | ORAL_CAPSULE | Freq: Three times a day (TID) | ORAL | Status: DC
  Administered 2012-06-10 – 2012-06-15 (×14): 2 mg via ORAL
  Filled 2012-06-10 (×16): qty 1

## 2012-06-10 MED ORDER — SACCHAROMYCES BOULARDII 250 MG PO CAPS
250.0000 mg | ORAL_CAPSULE | Freq: Two times a day (BID) | ORAL | Status: DC
  Administered 2012-06-11 – 2012-06-15 (×10): 250 mg via ORAL
  Filled 2012-06-10 (×12): qty 1

## 2012-06-10 NOTE — Progress Notes (Signed)
PT Cancellation Note  Patient Details Name: Natasha [name] MRN: [id number] DOB: [birth year]   Cancelled Treatment:    Reason Eval/Treat Not Completed: Medical issues which prohibited therapy (being [facility])   Claretha Cooper 06/10/2012, 3:44 PM

## 2012-06-10 NOTE — Consult Note (Signed)
Referring Provider: Irine Seal (Hospitalist) Primary Care Physician:  Garlan Fair, MD Primary Gastroenterologist:  Dr. Wynetta Emery  Reason for Consultation:  Crohn's flare up  HPI: Natasha [name] is a 67 y.o. female with a long-standing history of Crohn's ileitis, going back at least to 1978 when she had the first of 3 ileal resections, the most recent one being in 1989. She has been maintained on low-dose steroids and, in the past, mesalamine but the Asacol was stopped this past August because it seemed to be causing a paradoxical increase in her tendency toward diarrhea.  With that background, the patient became sick with a flulike illness about a week ago. As is typical for her after such illnesses, she then progressed into a Crohn flareup, characterized by nausea vomiting and increased frequency of stools. (At baseline,she averages or  4 or 5 somewhat soft or loose bowel movements a day.)   In any event, she presented to the emergency room where she was found to have acute on chronic renal failure. She was actually initially admitted to [facility] but because of the possible need for dialysis, she was transferred to the [facility] today.  she is starting to make some urine and her creatinine is creeping down, so it might be that she can get by without dialysis.  A CT scan on admission showed inflammatory changes including hyperemia in the neoterminal ileum, with some possible partial obstruction at her anastomosis, characterized by some slight dilatation in the distal small bowel.  The patient  has been  treated with stress doses of steroids and since admission to the hospital, has had resolution of her nausea and vomiting and perhaps some improvement in her diarrhea, with only 3 loose stools today.   Past Medical History  Diagnosis Date  . Chronic kidney disease   . GERD (gastroesophageal reflux disease)   . Crohn's colitis   . CKD (chronic kidney disease)  06/09/2012  . Hypothyroidism 06/10/2012    Past Surgical History  Procedure Date  . Colon resection   . Esophagogastroduodenoscopy 03/04/2012    Procedure: ESOPHAGOGASTRODUODENOSCOPY (EGD);  Surgeon: Garlan Fair, MD;  Location: [facility];  Service: Endoscopy;  Laterality: N/A;  . Flexible sigmoidoscopy 03/04/2012    Procedure: FLEXIBLE SIGMOIDOSCOPY;  Surgeon: Garlan Fair, MD;  Location: [facility] ENDOSCOPY;  Service: Endoscopy;  Laterality: N/A;    Prior to Admission medications   Medication Sig Start Date End Date Taking? Authorizing Provider  acetaminophen (TYLENOL) 500 MG tablet Take 500 mg by mouth every 6 (six) hours as needed. For severe pain   Yes Historical Provider, MD  cyanocobalamin (,VITAMIN B-12,) 1000 MCG/ML injection Inject 1,000 mcg into the muscle every 30 (thirty) days. Last injection was 05/07/12   Yes Historical Provider, MD  folic acid (FOLVITE) 1 MG tablet Take 1 mg by mouth daily.   Yes Historical Provider, MD  levothyroxine (SYNTHROID, LEVOTHROID) 125 MCG tablet Take 125 mcg by mouth daily.   Yes Historical Provider, MD  mesalamine (ASACOL) 400 MG EC tablet Take 400 mg by mouth 2 (two) times daily.   Yes Historical Provider, MD  omeprazole (PRILOSEC) 40 MG capsule Take 40 mg by mouth daily.   Yes Historical Provider, MD  ondansetron (ZOFRAN ODT) 4 MG disintegrating tablet Take 1 tablet (4 mg total) by mouth every 8 (eight) hours as needed for nausea. 06/04/12  Yes Perlie Mayo, MD  potassium chloride (K-DUR) 10 MEQ tablet Take 2 tablets (20 mEq total) by mouth  2 (two) times daily. 06/04/12  Yes Perlie Mayo, MD  predniSONE (DELTASONE) 5 MG tablet Take 5 mg by mouth daily.   Yes Historical Provider, MD  promethazine (PHENERGAN) 25 MG tablet Take 1 tablet (25 mg total) by mouth every 6 (six) hours as needed for nausea. 06/04/12  Yes Perlie Mayo, MD  risedronate (ACTONEL) 35 MG tablet Take 35 mg by mouth every 7 (seven) days. with water on empty stomach,  nothing by mouth or lie down for next 30 minutes. Takes on Monday   Yes Historical Provider, MD  vitamin B-12 (CYANOCOBALAMIN) 1000 MCG tablet Take 1,000 mcg by mouth daily.   Yes Historical Provider, MD    Current Facility-Administered Medications  Medication Dose Route Frequency Provider Last Rate Last Dose  . acetaminophen (TYLENOL) tablet 500 mg  500 mg Oral Q6H PRN Adeline C Viyuoh, MD      . enoxaparin (LOVENOX) injection 30 mg  30 mg Subcutaneous Q24H Adeline C Viyuoh, MD   30 mg at 06/09/12 2300  . folic acid (FOLVITE) tablet 1 mg  1 mg Oral Daily Adeline C Viyuoh, MD   1 mg at 06/10/12 1057  . hydrocortisone sodium succinate (SOLU-CORTEF) 100 mg/2 mL injection 50 mg  50 mg Intravenous Q8H Adeline C Viyuoh, MD   50 mg at 06/10/12 1057  . levothyroxine (SYNTHROID, LEVOTHROID) tablet 125 mcg  125 mcg Oral QAC breakfast Jyl Heinz, MD   125 mcg at 06/10/12 1660  . morphine 2 MG/ML injection 1 mg  1 mg Intravenous Q3H PRN Sheila Oats, MD   1 mg at 06/09/12 2114  . ondansetron (ZOFRAN) tablet 4 mg  4 mg Oral Q6H PRN Adeline C Viyuoh, MD       Or  . ondansetron (ZOFRAN) injection 4 mg  4 mg Intravenous Q6H PRN Sheila Oats, MD   4 mg at 06/09/12 1918  . pantoprazole (PROTONIX) injection 40 mg  40 mg Intravenous Q12H Adeline C Viyuoh, MD   40 mg at 06/10/12 1057  . potassium chloride SA (K-DUR,KLOR-CON) CR tablet 40 mEq  40 mEq Oral Q4H Eugenie Filler, MD   40 mEq at 06/10/12 1639  . promethazine (PHENERGAN) injection 12.5 mg  12.5 mg Intravenous Q6H PRN Rhetta Mura Schorr, NP   12.5 mg at 06/09/12 2141  . sodium bicarbonate 150 mEq in dextrose 5 % 1,000 mL infusion   Intravenous Continuous Sol Blazing, MD 100 mL/hr at 06/10/12 1516    . sodium chloride 0.9 % injection 3 mL  3 mL Intravenous Q12H Adeline C Viyuoh, MD      . vitamin B-12 (CYANOCOBALAMIN) tablet 1,000 mcg  1,000 mcg Oral Daily Sheila Oats, MD   1,000 mcg at 06/10/12 1057    Allergies as of 06/07/2012 -  Review Complete 06/07/2012  Allergen Reaction Noted  . Mercaptopurine  03/04/2012  . Remicade (infliximab)  03/04/2012    History reviewed. No pertinent family history.  History   Social History  . Marital Status: Married    Spouse Name: N/A    Number of Children: N/A  . Years of Education: N/A   Occupational History  . Not on file.   Social History Main Topics  . Smoking status: Current Every Day Smoker -- 0.2 packs/day  . Smokeless tobacco: Never Used  . Alcohol Use: 1.2 oz/week    2 Glasses of wine per week  . Drug Use:   . Sexually Active: No   Other  Topics Concern  . Not on file   Social History Narrative  . No narrative on file    Review of Systems: andsee HPI  Physical Exam: Vital signs in last 24 hours: Temp:  [97.8 F (36.6 C)-98.7 F (37.1 C)] 98.3 F (36.8 C) (01/16 1452) Pulse Rate:  [61-84] 84  (01/16 1500) Resp:  [12-20] 12  (01/16 1500) BP: (109-138)/(60-73) 127/73 mmHg (01/16 1500) SpO2:  [94 %-99 %] 95 % (01/16 1500) Last BM Date: 06/10/12 General:   Alert,  Well-developed, well-nourished, pleasant and cooperative  American female in NAD Head:  Normocephalic and atraumatic. Eyes:  Sclera clear, no icterus.   Conjunctiva pale. Mouth:   No ulcerations or lesions.  Oropharynx pink & moist. Neck:   No masses or thyromegaly. Lungs:  Clear throughout to auscultation.   No wheezes, crackles, or rhonchi. No evident respiratory distress. Heart:   Regular rate and rhythm; no murmurs, clicks, rubs,  or gallops. Abdomen:  Soft, nontender, nontympanitic, and nondistended. No masses, hepatosplenomegaly or ventral hernias noted. Normal bowel sounds, without bruits, guarding, or rebound.   Msk:   Symmetrical without gross deformities. Extremities:   Without clubbing, cyanosis, or edema. Neurologic:  Alert and coherent;  grossly normal neurologically. Skin:  Intact without significant lesions or rashes. Cervical Nodes:  No significant cervical  adenopathy. Psych:   Alert and cooperative. Normal mood and affect.  Intake/Output from previous day: 01/15 0701 - 01/16 0700 In: 4145 [I.V.:2985; IV Piggyback:1160] Out: 512 [Urine:512] Intake/Output this shift: Total I/O In: 1612.2 [I.V.:1612.2] Out: 400 [Urine:400]  Lab Results:  [facility] 06/10/12 0345 06/09/12 0325  WBC 9.1 7.6  HGB 9.5* 9.8*  HCT 26.8* 27.9*  PLT 135* 120*   BMET  Basename 06/10/12 1345 06/10/12 0345 06/09/12 1744  NA 137 134* 132*  K 2.4* 2.9* 2.4*  CL 97 98 95*  CO2 27 23 21   GLUCOSE 162* 132* 132*  BUN 61* 62* 62*  CREATININE 9.69* 10.15* 9.84*  CALCIUM 7.0* 7.4* 6.1*   LFT  Basename 06/09/12 0325  PROT --  ALBUMIN 1.8*  AST --  ALT --  ALKPHOS --  BILITOT --  BILIDIR --  IBILI --   PT/INR No results found for this basename: LABPROT:2,INR:2 in the last 72 hours  C-Diff negative  Studies/Results: No results found.  Impression:  1. Possible Crohn's flareup, based on CT findings which showed hyperemia of the neoterminal ileum, partial obstruction , plus typical flareup symptoms for the patient. The patient's symptoms are somewhat atypical for a Crohn's flareup.  Plan:  okay to taper steroids slightly. Add probiotics. Add Imodium. No need for urgent colonoscopy or aggressive medical therapy such as Remicade at present    LOS: 3 days   Monae Topping V  06/10/2012, 5:46 PM

## 2012-06-10 NOTE — Progress Notes (Signed)
Report called and given to [facility] on [address] a [facility]. [facility] has also been called and should be on the way. Will continue to monitor until pt has left the unit.

## 2012-06-10 NOTE — Progress Notes (Signed)
OT Cancellation Note  Patient Details Name: Natasha [name] MRN: [id number] DOB: Oct 03, 1945   Cancelled Treatment:    Reason Eval/Treat Not Completed: Other (comment) (Pt being transferred to [facility])  [facility], OTR/L [phone] 1/16/20141/16/2014, 4:34 PM

## 2012-06-10 NOTE — Progress Notes (Signed)
[facility] PROGRESS NOTE  Natasha [name] LGX:[id number] DOB: [birth year] DOA: 06/07/2012 PCP: Garlan Fair, MD  Assessment/Plan:  Nausea/ vomiting/Diarrhea/?? Crohn flare Patient states had abdominal pain last night with multiple loose stools. Patient states was taken off asacol. -Patient with Crohn's disease  -C. difficile negative  -She is on chronic steroids for her Crohn's >> stress dose steroids were started on admission. Cortisol level elevated at 47.8. Continue stress dose steroids for now as patient likely has a Crohn's flare.  -CT scan of abdomen and pelvis with oral contrast only-shows mild circumferential wall thickening suggesting inflammatory bowel disease. Negative for abscess or perforation.  Due to her worsening symptoms over night with abdominal pain and profuse diarrhea accompanied by CT scan findings, will consult gastroenterology for further evaluation and management. Her Gastroenterologist is Dr. Earle Gell. Asacol d/c'd yesterday, as patient states was taken off this per her gastroenterologist. Hypotension  -Likely secondary to hypovolemia due to #1, she is also on chronic prednisone so covering with stress dose steroids as above versus infection  -Continue Hydration with IV fluids  -cortisol level 47.8, continue stress dose steroids as above.  -Her urinalysis was consistent with a UTI. Urine cultures with 50,000 morphotypes. Patient has had 3 days of IV Rocephin. Will d/c rocephin.  Acute on chronic renal failure(CKD 4) baseline 2-3 -Patient's last creatinine on 1/9 was 2.68, and up to 9.21on 1/13 and worsening yesterday at 10.59 and today is 10.15 with no significant improvement. UOP has picked up overnight. - Dehydration, hypotension, UTI, likely contributing factors, possibly ATN  - Dr. Jonnie Finner consulted, and due to CT scan being negative for hydronephrosis no renal ultrasound is needed at this time. Patient's IV fluids with sodium bicarbonate have  been decreased to 150 cc/hr per renal. Will transfer to [facility] per renal rxcs. Dehydration  -Hydration with IV fluids follow and recheck.  Probable urinary tract infection  -Urine cultures with 50,000 colonies of multiple bacterial morphotypes. Patient was started on Rocephin on 06/07/2012. Patient has had 3 days of IV antibiotics. Will discontinue IV Rocephin.  Crohn's disease with probable flare  -Patient overnight with some abdominal pain and multiple loose stools. Patient's Asacol was discontinued yesterday as she states this was thought that her gastroenterologist several months ago. C. difficile PCR was negative. Continue stress dose steroids. Will consult with GI for further evaluation and management. Metabolic acidosis  -Secondary to acute on chronic renal failure, placed on bicarbonate. Improved- follow and recheck. Per renal Severe Hypokalemia  -Secondary to GI losses. Patient did have a significant hypomagnesemia with a magnesium level of 0.8 which has been repleted. Patient got 280 mEq of KCl orally yesterday and 4 runs of IV KCl . Potassium today is 2.9. Will give 80 mEq of potassium today and a repeat BMET this afternoon.   Severe hypomagnesemia Likely secondary to GI losses. Repleted. Follow Hypocalcemia Secondary to chronic kidney disease. Patient was given 4 g of IV calcium gluconate last night. Corrected calcium today is 9.2. Follow per renal. Anemia of chronic disease Patient with no overt GI bleed. Likely dilutional in nature and secondary to chronic kidney disease. Anemia panel consistent with anemia of chronic disease. H&H is stable. Follow. Hypothyroidism TSH of 0.983. Continue current dose of Synthroid. Flulike symptoms  -influenza panel negative,  Tamiflu d/c'd yesterday.   Code Status: Full Family Communication: Updated patient at bedside. No family present. Disposition Plan: Transfer to [facility]   Consultants:  Renal: Dr. Jonnie Finner  06/08/2012  Gastroenterology pending:  Procedures:  CT abdomen and pelvis 06/07/2012  Chest x-ray 06/03/2012  Antibiotics:  IV Rocephin 06/07/2012--->06/10/12  HPI/Subjective: Patient states had abdominal pain last night with multiple loose stools. Patient with some improved urine output overnight.  Objective: Filed Vitals:   06/09/12 2000 06/10/12 0000 06/10/12 0200 06/10/12 0400  BP: 117/70 122/62 109/60 115/68  Pulse: 68 77 71 68  Temp: 98.7 F (37.1 C) 98.2 F (36.8 C)  98.4 F (36.9 C)  TempSrc: Oral Oral  Oral  Resp: 15 19 14 14   Height:      Weight:      SpO2: 99% 96% 96% 94%    Intake/Output Summary (Last 24 hours) at 06/10/12 0815 Last data filed at 06/10/12 0547  Gross per 24 hour  Intake   3845 ml  Output    512 ml  Net   3333 ml   Filed Weights   06/07/12 1549 06/09/12 0400  Weight: 52.9 kg (116 lb 10 oz) 57.8 kg (127 lb 6.8 oz)    Exam:   General:  NAD  Cardiovascular: RRR no m/r/g. No JVD, No LE edema  Respiratory: CTAB  Abdomen: Soft/NT/ND/+BS  Data Reviewed: Basic Metabolic Panel:  Lab 32/44/01 0345 06/09/12 1744 06/09/12 0325 06/08/12 0600 06/07/12 1210  NA 134* 132* 136 133* 138  K 2.9* 2.4* <2.0* 2.1* 2.5*  CL 98 95* 105 104 110  CO2 23 21 15* 10* 10*  GLUCOSE 132* 132* 133* 115* 66*  BUN 62* 62* 67* 69* 60*  CREATININE 10.15* 9.84* 10.59* 10.49* 9.21*  CALCIUM 7.4* 6.1* 5.5* 6.5* 7.1*  MG 1.9 2.1 0.8* -- --  PHOS -- -- 2.5 -- --   Liver Function Tests:  Lab 06/09/12 0325 06/07/12 1210 06/03/12 2014  AST -- 11 15  ALT -- 5 8  ALKPHOS -- 166* 185*  BILITOT -- 0.3 0.5  PROT -- 6.3 7.5  ALBUMIN 1.8* 2.6* 3.3*    Lab 06/07/12 1210  LIPASE 39  AMYLASE --   No results found for this basename: AMMONIA:5 in the last 168 hours CBC:  Lab 06/10/12 0345 06/09/12 0325 06/07/12 1210 06/03/12 2014 06/03/12 1910  WBC 9.1 7.6 8.8 8.7 10.3  NEUTROABS -- -- 6.4 -- --  HGB 9.5* 9.8* 13.6 14.6 14.9  HCT 26.8* 27.9* 41.1 45.4  44.5  MCV 89.0 90.9 96.7 96.8 96.1  PLT 135* 120* 137* 132* 144*   Cardiac Enzymes: No results found for this basename: CKTOTAL:5,CKMB:5,CKMBINDEX:5,TROPONINI:5 in the last 168 hours BNP (last 3 results) No results found for this basename: PROBNP:3 in the last 8760 hours CBG: No results found for this basename: GLUCAP:5 in the last 168 hours  Recent Results (from the past 240 hour(s))  URINE CULTURE     Status: Normal   Collection Time   06/07/12 12:25 PM      Component Value Range Status Comment   Specimen Description URINE, CLEAN CATCH   Final    Special Requests NONE   Final    Culture  Setup Time 06/07/2012 22:26   Final    Colony Count 50,000 COLONIES/ML   Final    Culture     Final    Value: Multiple bacterial morphotypes present, none predominant. Suggest appropriate recollection if clinically indicated.   Report Status 06/08/2012 FINAL   Final   MRSA PCR SCREENING     Status: Normal   Collection Time   06/07/12  3:28 PM      Component Value Range Status Comment  MRSA by PCR NEGATIVE  NEGATIVE Final   CULTURE, BLOOD (ROUTINE X 2)     Status: Normal (Preliminary result)   Collection Time   06/07/12 10:35 PM      Component Value Range Status Comment   Specimen Description BLOOD LEFT ANTECUBITAL   Final    Special Requests BOTTLES DRAWN AEROBIC AND ANAEROBIC 3CC   Final    Culture  Setup Time 06/08/2012 07:53   Final    Culture     Final    Value:        BLOOD CULTURE RECEIVED NO GROWTH TO DATE CULTURE WILL BE HELD FOR 5 DAYS BEFORE ISSUING A FINAL NEGATIVE REPORT   Report Status PENDING   Incomplete   CLOSTRIDIUM DIFFICILE BY PCR     Status: Normal   Collection Time   06/07/12 10:37 PM      Component Value Range Status Comment   C difficile by pcr NEGATIVE  NEGATIVE Final   STOOL CULTURE     Status: Normal (Preliminary result)   Collection Time   06/07/12 10:38 PM      Component Value Range Status Comment   Specimen Description STOOL   Final    Special Requests NONE    Final    Culture Culture reincubated for better growth   Final    Report Status PENDING   Incomplete   CULTURE, BLOOD (ROUTINE X 2)     Status: Normal (Preliminary result)   Collection Time   06/07/12 10:45 PM      Component Value Range Status Comment   Specimen Description BLOOD RIGHT HAND   Final    Special Requests BOTTLES DRAWN AEROBIC ONLY 1.5CC   Final    Culture  Setup Time 06/08/2012 07:54   Final    Culture     Final    Value:        BLOOD CULTURE RECEIVED NO GROWTH TO DATE CULTURE WILL BE HELD FOR 5 DAYS BEFORE ISSUING A FINAL NEGATIVE REPORT   Report Status PENDING   Incomplete      Studies: No results found.  Scheduled Meds:    . enoxaparin (LOVENOX) injection  30 mg Subcutaneous Q24H  . folic acid  1 mg Oral Daily  . hydrocortisone sod succinate (SOLU-CORTEF) injection  50 mg Intravenous Q8H  . levothyroxine  125 mcg Oral QAC breakfast  . pantoprazole (PROTONIX) IV  40 mg Intravenous Q12H  . potassium chloride  40 mEq Oral Q4H  . sodium chloride  3 mL Intravenous Q12H  . vitamin B-12  1,000 mcg Oral Daily   Continuous Infusions:    .  sodium bicarbonate infusion 1000 mL 150 mL/hr at 06/10/12 0700    Principal Problem:  *Acute on chronic kidney disease, stage 4 Active Problems:  Acute renal failure  Dehydration  Crohn's disease  Metabolic acidosis  Hypokalemia  Diarrhea  UTI (lower urinary tract infection)  Anemia  Hypomagnesemia  CKD (chronic kidney disease)  Hypocalcemia  Hypothyroidism    Time spent: > 40 mins    [facility] Pager [phone]. If 8PM-8AM, please contact night-coverage at [email or web address], password [facility] 06/10/2012, 8:15 AM  LOS: 3 days

## 2012-06-10 NOTE — Progress Notes (Signed)
Subjective: Nausea still there but better today and actually had solid food twice today and feels better overall  Objective Vital signs in last 24 hours: Filed Vitals:   06/10/12 0900 06/10/12 1000 06/10/12 1100 06/10/12 1200  BP: 133/69 127/62 119/68 125/66  Pulse: 61 74 76 79  Temp:    97.8 F (36.6 C)  TempSrc:    Oral  Resp: 13 18    Height:      Weight:      SpO2: 99% 97% 97% 97%   Weight change:   Intake/Output Summary (Last 24 hours) at 06/10/12 1329 Last data filed at 06/10/12 1100  Gross per 24 hour  Intake   2588 ml  Output    657 ml  Net   1931 ml   Labs: Basic Metabolic Panel:  Lab 02/40/97 0345 06/09/12 1744 06/09/12 0325 06/08/12 0600 06/07/12 1210 06/03/12 2014 06/03/12 1910  NA 134* 132* 136 133* 138 139 140  K 2.9* 2.4* <2.0* 2.1* 2.5* 2.4* 2.1*  CL 98 95* 105 104 110 107 107  CO2 23 21 15* 10* 10* 18* 18*  GLUCOSE 132* 132* 133* 115* 66* 110* 114*  BUN 62* 62* 67* 69* 60* 28* 27*  CREATININE 10.15* 9.84* 10.59* 10.49* 9.21* 2.68* 2.77*  ALB -- -- -- -- -- -- --  CALCIUM 7.4* 6.1* 5.5* 6.5* 7.1* 9.0 9.1  PHOS -- -- 2.5 -- -- -- --   Liver Function Tests:  Lab 06/09/12 0325 06/07/12 1210 06/03/12 2014  AST -- 11 15  ALT -- 5 8  ALKPHOS -- 166* 185*  BILITOT -- 0.3 0.5  PROT -- 6.3 7.5  ALBUMIN 1.8* 2.6* 3.3*    Lab 06/07/12 1210  LIPASE 39  AMYLASE --   No results found for this basename: AMMONIA:3 in the last 168 hours CBC:  Lab 06/10/12 0345 06/09/12 0325 06/07/12 1210 06/03/12 2014  WBC 9.1 7.6 8.8 8.7  NEUTROABS -- -- 6.4 --  HGB 9.5* 9.8* 13.6 14.6  HCT 26.8* 27.9* 41.1 45.4  MCV 89.0 90.9 96.7 96.8  PLT 135* 120* 137* 132*   PT/INR: @labrcntip (inr:5) Cardiac Enzymes: No results found for this basename: CKTOTAL:5,CKMB:5,CKMBINDEX:5,TROPONINI:5 in the last 168 hours CBG: No results found for this basename: GLUCAP:5 in the last 168 hours  Iron Studies:   Lab 06/09/12 0835  IRON 129  TIBC 151*  TRANSFERRIN --  FERRITIN  331*    Physical Exam:  Blood pressure 125/66, pulse 79, temperature 97.8 F (36.6 C), temperature source Oral, resp. rate 18, height 5' 5"  (1.651 m), weight 57.8 kg (127 lb 6.8 oz), SpO2 97.00%.  Gen: alert, older adult female, no distress, looks stronger and brighter today Skin: no rash, cyanosis  HEENT: EOMI, sclera anicteric, throat dry  Neck: no JVD, no LAN  Chest: clear bilat, no rales or rhonchi  CV: regular, no rub or gallop, no murmur, pedal pulses intact  Abdomen: soft, nontender, no ascites or HSM  Ext: no LE or UE edema  Neuro: alert, Ox3, no focal deficit, no asterixis   UA- modLE, large bld, 11-20 wbc, 3-6 rbc, many bact/epis, +gran casts  CT abd (oral contrast only) > no hydro   Impression/Plan  1. Acute on CRF- baseline creat 2.7 on 1/9, admitted with ARF, creat 10.5 due to severe vol depletion/hypotension in setting of diarrhea for 2-3 weeks, hx of Crohn's. After 3 days of IVF"s BP now is up and patient looks a lot better. Starting to make some urine, creat  down slightly and N/V is better; will hold off on dialysis for now.  Discussed with Dr. Grandville Silos.  Will decrease fluids to 100/hr, cont bicarb gtt for now. May still require HD, will reevaluate daily 2. 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 Crohn's disease, hx abd surg in 1980's, steroid-dependent- + diarrhea, CT thickened distal SB prior to colon anastamosis, possible IBD; GI consulted per primary 4. Pyuria, r/o UTI; per primary 5. Hypokalemia due to diarrhea- improving, more KCl today per primary 6. Hypomagnesemia due to diarrhea- got 4gm IV Mg yest, improved 7. Hypocalcemia due to advanced RF- rec'd 4 gm IV Ca yest, improved   [facility] [phone] pgr    [phone] cell 06/10/2012, 1:29 PM

## 2012-06-10 NOTE — Progress Notes (Signed)
Attending medical MD is ware of the wait on transfer bed to [facility] [place]. Will also let the Renal MD know to see if we need change bed status. Will continue to monitor pt. VSS and NAD at this time.

## 2012-06-11 DIAGNOSIS — A029 Salmonella infection, unspecified: Secondary | ICD-10-CM

## 2012-06-11 DIAGNOSIS — IMO0001 Reserved for inherently not codable concepts without codable children: Secondary | ICD-10-CM | POA: Diagnosis present

## 2012-06-11 DIAGNOSIS — N189 Chronic kidney disease, unspecified: Secondary | ICD-10-CM

## 2012-06-11 LAB — RENAL FUNCTION PANEL
CO2: 30 mEq/L (ref 19–32)
Chloride: 95 mEq/L — ABNORMAL LOW (ref 96–112)
GFR calc Af Amer: 4 mL/min — ABNORMAL LOW (ref >90)
GFR calc non Af Amer: 4 mL/min — ABNORMAL LOW (ref >90)
Glucose, Bld: 129 mg/dL — ABNORMAL HIGH (ref 70–99)
Sodium: 139 mEq/L (ref 135–145)

## 2012-06-11 LAB — CBC
HCT: 28.6 % — ABNORMAL LOW (ref 36.0–46.0)
Hemoglobin: 9.8 g/dL — ABNORMAL LOW (ref 12.0–15.0)
MCH: 31.1 pg (ref 26.0–34.0)
Platelets: 145 10*3/uL — ABNORMAL LOW (ref 150–400)
RBC: 3.15 MIL/uL — ABNORMAL LOW (ref 3.87–5.11)
WBC: 9.4 10*3/uL (ref 4.0–10.5)

## 2012-06-11 LAB — MAGNESIUM: Magnesium: 1.8 mg/dL (ref 1.5–2.5)

## 2012-06-11 MED ORDER — CIPROFLOXACIN HCL 500 MG PO TABS
500.0000 mg | ORAL_TABLET | Freq: Every day | ORAL | Status: DC
  Administered 2012-06-11 – 2012-06-14 (×4): 500 mg via ORAL
  Filled 2012-06-11 (×5): qty 1

## 2012-06-11 MED ORDER — POTASSIUM CHLORIDE CRYS ER 20 MEQ PO TBCR: 40.0000 meq | EXTENDED_RELEASE_TABLET | Freq: Three times a day (TID) | ORAL | Status: DC

## 2012-06-11 MED ORDER — POTASSIUM CHLORIDE CRYS ER 20 MEQ PO TBCR
40.0000 meq | EXTENDED_RELEASE_TABLET | Freq: Three times a day (TID) | ORAL | Status: AC
  Administered 2012-06-11 (×3): 40 meq via ORAL
  Filled 2012-06-11 (×3): qty 2

## 2012-06-11 MED ORDER — K PHOS MONO-SOD PHOS DI & MONO 155-852-130 MG PO TABS
250.0000 mg | ORAL_TABLET | Freq: Two times a day (BID) | ORAL | Status: DC
  Administered 2012-06-11 – 2012-06-15 (×9): 250 mg via ORAL
  Filled 2012-06-11 (×10): qty 1

## 2012-06-11 MED ORDER — ENSURE PUDDING PO PUDG
1.0000 | Freq: Two times a day (BID) | ORAL | Status: DC
  Administered 2012-06-11: 1 via ORAL

## 2012-06-11 NOTE — Progress Notes (Signed)
CRITICAL VALUE ALERT  Critical value received: 2.6  Date of notification:  06/12/15  Time of notification:  0740  Critical value read back:yes  Nurse who received alert:  Caryl Pina  MD notified (1st page):  laza  Time of first page:  0744  MD notified (2nd page):  Time of second page:  Responding MD:  laza  Time MD responded:  752

## 2012-06-11 NOTE — Consult Note (Signed)
INFECTIOUS DISEASE CONSULT NOTE  Date of Admission:  06/07/2012  Date of Consult:  06/11/2012  Reason for Consult:  Referring Physician: Marye Round  Impression/Recommendation Salmonella in stool Cx Crohn's ARF  Would- give pt cipro per pharmacy (to help with adjustment as her Cr improves, hopefully) F/u sensi on her salmonella on stool cx.   Comment- she is not at risk for HUS unless she has had overtly bloody stools (which would suggest shiga toxin strain), which she has not. In immunocompetent pt's, therapy does not change outcomes. This pt is not immunocompetent though as she is on steroids at home (36m daily) although relative a low dose.   Thank you so much for this interesting consult,   JBobby [id number] Natasha SimmermanFSpalloneis an 67y.o. female.  HPI: 67yo F with hx of CKD, Crohn's (since 1978), seen in ED 1-9 and dx with flu. Returns and adm on 1-13 with abd pain, n/v, and diarrhea. She was noted to have acute renal failure, CO2 of 10. She was also felt to have a UTI. She was started on stress dose steroids for her crohn's. She had CT abd showing inflammatory bowel disease but no abscess. She was started on ceftriaxone.  [facility]. Her Cr has improved minimally. She is now found to have Salmonella in her stool Cx.   Past Medical History  Diagnosis Date  . Chronic kidney disease   . GERD (gastroesophageal reflux disease)   . Crohn's colitis   . CKD (chronic kidney disease) 06/09/2012  . Hypothyroidism 06/10/2012    Past Surgical History  Procedure Date  . Colon resection   . Esophagogastroduodenoscopy 03/04/2012    Procedure: ESOPHAGOGASTRODUODENOSCOPY (EGD);  Surgeon: MGarlan Fair MD;  [facility];  Service: Endoscopy;  Laterality: N/A;  . Flexible sigmoidoscopy 03/04/2012    Procedure: FLEXIBLE SIGMOIDOSCOPY;  Surgeon: MGarlan Fair MD;  Location: [facility];  Service: Endoscopy;   Laterality: N/A;     Allergies  Allergen Reactions  . Mercaptopurine     unknown  . Remicade (Infliximab)     unknown    Medications:  Scheduled:   . enoxaparin (LOVENOX) injection  30 mg Subcutaneous Q24H  . feeding supplement  1 Container Oral BID BM  . folic acid  1 mg Oral Daily  . hydrocortisone sod succinate (SOLU-CORTEF) injection  50 mg Intravenous Q8H  . levothyroxine  125 mcg Oral QAC breakfast  . loperamide  2 mg Oral TID  . pantoprazole (PROTONIX) IV  40 mg Intravenous Q12H  . phosphorus  250 mg Oral BID  . potassium chloride  40 mEq Oral TID  . saccharomyces boulardii  250 mg Oral BID  . sodium chloride  3 mL Intravenous Q12H  . vitamin B-12  1,000 mcg Oral Daily    Total days of antibiotics: 0   Social History:  reports that she has been smoking.  She has never used smokeless tobacco. She reports that she drinks about 1.2 ounces of alcohol per week. Her drug history not on file.  History reviewed. No pertinent family history.  General ROS: no oral ulcers, no further fevers, eating small amt, BM have decreased to 1 today, foley out today. see HPI.   Blood pressure 116/70, pulse 76, temperature 97.8 F (36.6 C), temperature source Oral, resp. rate 18, height 5' 5"  (1.651 m), weight 62.324 kg (137 lb 6.4 oz), SpO2 96.00%. General appearance: alert,  cooperative and no distress Eyes: negative findings: pupils equal, round, reactive to light and accomodation Throat: normal findings: oropharynx pink & moist without lesions or evidence of thrush Lungs: clear to auscultation bilaterally Heart: regular rate and rhythm Abdomen: normal findings: bowel sounds normal and soft, non-tender Extremities: edema none   Results for orders placed during the hospital encounter of 06/07/12 (from the past 48 hour(s))  BASIC METABOLIC PANEL     Status: Abnormal   Collection Time   06/09/12  5:44 PM      Component Value Range Comment   Sodium 132 (*) 135 - 145 mEq/L     Potassium 2.4 (*) 3.5 - 5.1 mEq/L    Chloride 95 (*) 96 - 112 mEq/L DELTA CHECK NOTED   CO2 21  19 - 32 mEq/L    Glucose, Bld 132 (*) 70 - 99 mg/dL    BUN 62 (*) 6 - 23 mg/dL    Creatinine, Ser 9.84 (*) 0.50 - 1.10 mg/dL    Calcium 6.1 (*) 8.4 - 10.5 mg/dL    GFR calc non Af Amer 4 (*) >90 mL/min    GFR calc Af Amer 4 (*) >90 mL/min   MAGNESIUM     Status: Normal   Collection Time   06/09/12  5:44 PM      Component Value Range Comment   Magnesium 2.1  1.5 - 2.5 mg/dL   BASIC METABOLIC PANEL     Status: Abnormal   Collection Time   06/10/12  3:45 AM      Component Value Range Comment   Sodium 134 (*) 135 - 145 mEq/L    Potassium 2.9 (*) 3.5 - 5.1 mEq/L    Chloride 98  96 - 112 mEq/L    CO2 23  19 - 32 mEq/L    Glucose, Bld 132 (*) 70 - 99 mg/dL    BUN 62 (*) 6 - 23 mg/dL    Creatinine, Ser 10.15 (*) 0.50 - 1.10 mg/dL    Calcium 7.4 (*) 8.4 - 10.5 mg/dL    GFR calc non Af Amer 3 (*) >90 mL/min    GFR calc Af Amer 4 (*) >90 mL/min   MAGNESIUM     Status: Normal   Collection Time   06/10/12  3:45 AM      Component Value Range Comment   Magnesium 1.9  1.5 - 2.5 mg/dL   CBC     Status: Abnormal   Collection Time   06/10/12  3:45 AM      Component Value Range Comment   WBC 9.1  4.0 - 10.5 K/uL    RBC 3.01 (*) 3.87 - 5.11 MIL/uL    Hemoglobin 9.5 (*) 12.0 - 15.0 g/dL    HCT 26.8 (*) 36.0 - 46.0 %    MCV 89.0  78.0 - 100.0 fL    MCH 31.6  26.0 - 34.0 pg    MCHC 35.4  30.0 - 36.0 g/dL    RDW 13.8  11.5 - 15.5 %    Platelets 135 (*) 150 - 400 K/uL   BASIC METABOLIC PANEL     Status: Abnormal   Collection Time   06/10/12  1:45 PM      Component Value Range Comment   Sodium 137  135 - 145 mEq/L    Potassium 2.4 (*) 3.5 - 5.1 mEq/L    Chloride 97  96 - 112 mEq/L    CO2 27  19 - 32 mEq/L  Glucose, Bld 162 (*) 70 - 99 mg/dL    BUN 61 (*) 6 - 23 mg/dL    Creatinine, Ser 9.69 (*) 0.50 - 1.10 mg/dL    Calcium 7.0 (*) 8.4 - 10.5 mg/dL    GFR calc non Af Amer 4 (*) >90 mL/min     GFR calc Af Amer 4 (*) >90 mL/min   MAGNESIUM     Status: Normal   Collection Time   06/10/12  1:45 PM      Component Value Range Comment   Magnesium 1.9  1.5 - 2.5 mg/dL   CBC     Status: Abnormal   Collection Time   06/11/12  6:10 AM      Component Value Range Comment   WBC 9.4  4.0 - 10.5 K/uL    RBC 3.15 (*) 3.87 - 5.11 MIL/uL    Hemoglobin 9.8 (*) 12.0 - 15.0 g/dL    HCT 28.6 (*) 36.0 - 46.0 %    MCV 90.8  78.0 - 100.0 fL    MCH 31.1  26.0 - 34.0 pg    MCHC 34.3  30.0 - 36.0 g/dL    RDW 13.8  11.5 - 15.5 %    Platelets 145 (*) 150 - 400 K/uL   MAGNESIUM     Status: Normal   Collection Time   06/11/12  6:10 AM      Component Value Range Comment   Magnesium 1.8  1.5 - 2.5 mg/dL   RENAL FUNCTION PANEL     Status: Abnormal   Collection Time   06/11/12  6:10 AM      Component Value Range Comment   Sodium 139  135 - 145 mEq/L    Potassium 2.6 (*) 3.5 - 5.1 mEq/L    Chloride 95 (*) 96 - 112 mEq/L    CO2 30  19 - 32 mEq/L    Glucose, Bld 129 (*) 70 - 99 mg/dL    BUN 60 (*) 6 - 23 mg/dL    Creatinine, Ser 9.55 (*) 0.50 - 1.10 mg/dL    Calcium 6.9 (*) 8.4 - 10.5 mg/dL    Phosphorus 2.0 (*) 2.3 - 4.6 mg/dL    Albumin 2.0 (*) 3.5 - 5.2 g/dL    GFR calc non Af Amer 4 (*) >90 mL/min    GFR calc Af Amer 4 (*) >90 mL/min   PHOSPHORUS     Status: Abnormal   Collection Time   06/11/12  2:55 PM      Component Value Range Comment   Phosphorus 2.0 (*) 2.3 - 4.6 mg/dL       Component Value Date/Time   SDES BLOOD RIGHT HAND 06/07/2012 2245   SPECREQUEST BOTTLES DRAWN AEROBIC ONLY 1.5CC 06/07/2012 2245   CULT        BLOOD CULTURE RECEIVED NO GROWTH TO DATE CULTURE WILL BE HELD FOR 5 DAYS BEFORE ISSUING A FINAL NEGATIVE REPORT 06/07/2012 2245   REPTSTATUS PENDING 06/07/2012 2245   No results found. Recent Results (from the past 240 hour(s))  URINE CULTURE     Status: Normal   Collection Time   06/07/12 12:25 PM      Component Value Range Status Comment   Specimen Description URINE, CLEAN  CATCH   Final    Special Requests NONE   Final    Culture  Setup Time 06/07/2012 22:26   Final    Colony Count 50,000 COLONIES/ML   Final    Culture  Final    Value: Multiple bacterial morphotypes present, none predominant. Suggest appropriate recollection if clinically indicated.   Report Status 06/08/2012 FINAL   Final   MRSA PCR SCREENING     Status: Normal   Collection Time   06/07/12  3:28 PM      Component Value Range Status Comment   MRSA by PCR NEGATIVE  NEGATIVE Final   CULTURE, BLOOD (ROUTINE X 2)     Status: Normal (Preliminary result)   Collection Time   06/07/12 10:35 PM      Component Value Range Status Comment   Specimen Description BLOOD LEFT ANTECUBITAL   Final    Special Requests BOTTLES DRAWN AEROBIC AND ANAEROBIC 3CC   Final    Culture  Setup Time 06/08/2012 07:53   Final    Culture     Final    Value:        BLOOD CULTURE RECEIVED NO GROWTH TO DATE CULTURE WILL BE HELD FOR 5 DAYS BEFORE ISSUING A FINAL NEGATIVE REPORT   Report Status PENDING   Incomplete   CLOSTRIDIUM DIFFICILE BY PCR     Status: Normal   Collection Time   06/07/12 10:37 PM      Component Value Range Status Comment   C difficile by pcr NEGATIVE  NEGATIVE Final   STOOL CULTURE     Status: Normal (Preliminary result)   Collection Time   06/07/12 10:38 PM      Component Value Range Status Comment   Specimen Description STOOL   Final    Special Requests NONE   Final    Culture Culture reincubated for better growth   Final    Report Status PENDING   Incomplete   CULTURE, BLOOD (ROUTINE X 2)     Status: Normal (Preliminary result)   Collection Time   06/07/12 10:45 PM      Component Value Range Status Comment   Specimen Description BLOOD RIGHT HAND   Final    Special Requests BOTTLES DRAWN AEROBIC ONLY 1.5CC   Final    Culture  Setup Time 06/08/2012 07:54   Final    Culture     Final    Value:        BLOOD CULTURE RECEIVED NO GROWTH TO DATE CULTURE WILL BE HELD FOR 5 DAYS BEFORE ISSUING A FINAL  NEGATIVE REPORT   Report Status PENDING   Incomplete       06/11/2012, 4:43 PM     LOS: 4 days

## 2012-06-11 NOTE — Progress Notes (Addendum)
NUTRITION FOLLOW UP  Intervention:   1. Recommend potential diet change from renal 80/90 to heart healthy to provide pt more choices as long as kidney function labs WNL. Discussed with Dr. Jonnie Finner and changed to Regular. 2. Agree with florastor; continue per GI discretion 3. Add Ensure Complete supplement to help meet nutrition needs during flare up per pt request; if pt requires additional potassium and phosphorus restriction may switch to Nepro Shake   Nutrition Dx:   Altered GI function now related to chronic Crohns disease as evidenced by loose stools and periodic abdominal discomfort.   Inadequate oral intake related to abdominal discomfort and diarrhea secondary to Crohns disease as evidenced by pt report.   Goal:   Pt meet >/= 90% of nutritional needs   Monitor:   Labs, I/O's, weight trends, PO intake, food & beverage tolerance with diet advancement   Assessment:    67 y.o. female admitted with nausea, vomiting, diarrhea for several days. Pt reports current symptoms are similar to previous Crohn's flares in that she has diarrhea, nausea, and soreness, however she reports typically her symptoms are not this severe and she does not usually have vomiting.   Pt reports she usually experiences some weight loss during flare ups.  Pt tolerating renal diet but is trying to be selective about what she eats to avoid any more diarrhea.  According to pt, clear liquid diet "goes right through her" and she feels like she is tolerating solid foods better. She avoids things that are "too heavy" (grits, gravey).  Pt reports return of appetite but says she usually prefers smaller frequent meals.  After this episode, pt feels like she would benefit from an oral nutrition supplement and would like to try it out to see how she tolerates it.   Labs reviewed; noted that pt is on Renal 80-90 diet, restricting potassium and phosphorus, however most recent potassium and phosphorus labs are low.  Height: Ht  Readings from Last 1 Encounters:  06/07/12 5' 5"  (1.651 m)    Weight Status:   Wt Readings from Last 1 Encounters:  06/10/12 137 lb 6.4 oz (62.324 kg)  Wt significantly increased from admit, however pt reports usual weight is 135 lb.  Re-estimated needs:  Kcal: 1500 - 1800 kcal  Protein: 75 - 90 Fluid: 1.2 L   Skin: intact, WNL  Diet Order: Renal 80/90 2-2   Intake/Output Summary (Last 24 hours) at 06/11/12 0938 Last data filed at 06/11/12 6825  Gross per 24 hour  Intake 1832.17 ml  Output   1200 ml  Net 632.17 ml    Last BM: 06/10/2012   Labs:   Lab 06/11/12 0610 06/10/12 1345 06/10/12 0345 06/09/12 0325  NA 139 137 134* --  K 2.6* 2.4* 2.9* --  CL 95* 97 98 --  CO2 30 27 23  --  BUN 60* 61* 62* --  CREATININE 9.55* 9.69* 10.15* --  CALCIUM 6.9* 7.0* 7.4* --  MG 1.8 1.9 1.9 --  PHOS 2.0* -- -- 2.5  GLUCOSE 129* 162* 132* --    CBG (last 3)  No results found for this basename: GLUCAP:3 in the last 72 hours  Scheduled Meds:   . enoxaparin (LOVENOX) injection  30 mg Subcutaneous Q24H  . folic acid  1 mg Oral Daily  . hydrocortisone sod succinate (SOLU-CORTEF) injection  50 mg Intravenous Q8H  . levothyroxine  125 mcg Oral QAC breakfast  . loperamide  2 mg Oral TID  . pantoprazole (PROTONIX) IV  40 mg Intravenous Q12H  . potassium chloride  40 mEq Oral TID  . saccharomyces boulardii  250 mg Oral BID  . sodium chloride  3 mL Intravenous Q12H  . vitamin B-12  1,000 mcg Oral Daily    Continuous Infusions:   .  sodium bicarbonate infusion 1000 mL 100 mL/hr at 06/10/12 [place]  Dietetic Intern Pager: [phone]  I agree with the above information. Inda Coke MS, RD, LDN Pager: [phone] After-hours pager: [phone]

## 2012-06-11 NOTE — Progress Notes (Signed)
Feeling better. Diarrhea has resolved  Since I saw the patient on rounds earlier today, her stool culture has come back positive for Salmonella, for which Cipro has been recommended by the ID consult.  In light of this, I anticipate she can be sent home on a dose of prednisone similar to, or slightly higher than, her outpatient dose of 5 mg per day.  Cleotis Nipper, M.D. [phone]

## 2012-06-11 NOTE — Progress Notes (Signed)
Subjective: No new complaints  Objective Vital signs in last 24 hours: Filed Vitals:   06/10/12 1825 06/10/12 2057 06/11/12 1008 06/11/12 1400  BP: 140/66 122/61 119/77 116/70  Pulse: 77 62 71 76  Temp: 98.1 F (36.7 C) 98.9 F (37.2 C) 98.8 F (37.1 C) 97.8 F (36.6 C)  TempSrc: Oral Oral Oral Oral  Resp: 20 17 18 18   Height:      Weight:  62.324 kg (137 lb 6.4 oz)    SpO2: 96% 95% 95% 96%   Weight change:   Intake/Output Summary (Last 24 hours) at 06/11/12 1551 Last data filed at 06/11/12 1300  Gross per 24 hour  Intake 2429.17 ml  Output    800 ml  Net 1629.17 ml   Labs: Basic Metabolic Panel:  Lab 62/83/66 1455 06/11/12 0610 06/10/12 1345 06/10/12 0345 06/09/12 1744 06/09/12 0325 06/08/12 0600 06/07/12 1210  NA -- 139 137 134* 132* 136 133* 138  K -- 2.6* 2.4* 2.9* 2.4* <2.0* 2.1* 2.5*  CL -- 95* 97 98 95* 105 104 110  CO2 -- 30 27 23 21  15* 10* 10*  GLUCOSE -- 129* 162* 132* 132* 133* 115* 66*  BUN -- 60* 61* 62* 62* 67* 69* 60*  CREATININE -- 9.55* 9.69* 10.15* 9.84* 10.59* 10.49* 9.21*  ALB -- -- -- -- -- -- -- --  CALCIUM -- 6.9* 7.0* 7.4* 6.1* 5.5* 6.5* 7.1*  PHOS 2.0* 2.0* -- -- -- 2.5 -- --   Liver Function Tests:  Lab 06/11/12 0610 06/09/12 0325 06/07/12 1210  AST -- -- 11  ALT -- -- 5  ALKPHOS -- -- 166*  BILITOT -- -- 0.3  PROT -- -- 6.3  ALBUMIN 2.0* 1.8* 2.6*    Lab 06/07/12 1210  LIPASE 39  AMYLASE --   No results found for this basename: AMMONIA:3 in the last 168 hours CBC:  Lab 06/11/12 0610 06/10/12 0345 06/09/12 0325 06/07/12 1210  WBC 9.4 9.1 7.6 8.8  NEUTROABS -- -- -- 6.4  HGB 9.8* 9.5* 9.8* 13.6  HCT 28.6* 26.8* 27.9* 41.1  MCV 90.8 89.0 90.9 96.7  PLT 145* 135* 120* 137*   PT/INR: @labrcntip (inr:5) Cardiac Enzymes: No results found for this basename: CKTOTAL:5,CKMB:5,CKMBINDEX:5,TROPONINI:5 in the last 168 hours CBG: No results found for this basename: GLUCAP:5 in the last 168 hours  Iron Studies:   Lab 06/09/12  0835  IRON 129  TIBC 151*  TRANSFERRIN --  FERRITIN 331*    Physical Exam:  Blood pressure 116/70, pulse 76, temperature 97.8 F (36.6 C), temperature source Oral, resp. rate 18, height 5' 5"  (1.651 m), weight 62.324 kg (137 lb 6.4 oz), SpO2 96.00%.  Gen: alert, older adult female, no distress, looks stronger and brighter today Skin: no rash, cyanosis  HEENT: EOMI, sclera anicteric, throat dry  Neck: no JVD, no LAN  Chest: clear bilat, no rales or rhonchi  CV: regular, no rub or gallop, no murmur, pedal pulses intact  Abdomen: soft, nontender, no ascites or HSM  Ext: no LE or UE edema  Neuro: alert, Ox3, no focal deficit, no asterixis   UA- modLE, large bld, 11-20 wbc, 3-6 rbc, many bact/epis, +gran casts  CT abd (oral contrast only) > no hydro   Impression/Plan  1. Acute on CRF- due to severe volume depletion; now making good amounts of urine after 3d of IVF"s. Hopefully will cont to improve. No indication for HD, creat down insig amount. Will follow.  2. 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  Crohn's disease, hx abd surg in 1980's, steroid-dependent- + diarrhea, CT thickened distal SB prior to colon anastamosis, possible IBD; GI consulted per primary 4. Hypokalemia due to diarrhea- improving, more KCl today per primary 5. Hypomagnesemia due to diarrhea- got 4gm IV Mg yest, improved 6. Hypocalcemia due to advanced RF- rec'd 4 gm IV Ca, improved   Rob Google [phone] pgr    [phone] cell 06/11/2012, 3:51 PM

## 2012-06-11 NOTE — Progress Notes (Signed)
[facility] PROGRESS NOTE  Natasha [name] LZJ:[id number] DOB: September 03, 1945 DOA: 06/07/2012 PCP: Garlan Fair, MD  Assessment/Plan:  Nausea/ vomiting/Diarrhea/?? Crohn flare -. Patient states was taken off asacol PTA. -C. difficile negative  -She is on chronic steroids for her Crohn's and we did prescribe Hydrocortisone 50 mg iv every 8 hours after admission.  -CT scan of abdomen and pelvis with oral contrast only-shows mild circumferential wall thickening suggesting inflammatory bowel disease. Negative for abscess or perforation.  [facility] on board  - Micro lab called on 06/11/12 and reported that the patient is growing Salmonella in the stool culture   Hypotension  -Likely secondary to hypovolemia due to #1, she is also on chronic prednisone so covering with stress dose steroids as above -Continue Hydration with IV fluids  -Her urinalysis was consistent with a UTI. Urine cultures with 50,000 morphotypes. Patient has had 3 days of IV Rocephin. 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 - UOP 1200 cc last 24 hrs  - Dehydration, hypotension, UTI, likely contributing factors, possibly ATN  - [facility] Dr. Jonnie Finner consulted, and due to CT scan being negative for hydronephrosis no renal ultrasound is needed at this time. Patient was treated with IV fluids with sodium bicarbonate   Dehydration  -Hydration with IV fluids follow and recheck.  Probable urinary tract infection  -Urine cultures with 50,000 colonies of multiple bacterial morphotypes. Patient was started on Rocephin on 06/07/2012. Patient has had 3 days of IV antibiotics. Will discontinue IV Rocephin.   Metabolic acidosis  -Secondary to acute on chronic renal failure, placed on bicarbonate. Improved- follow and recheck. Per renal  Severe Hypokalemia    -Secondary to GI losses. Patient did have a significant hypomagnesemia with a magnesium level of 0.8 which has been repleted. Will continue to replete   Severe hypomagnesemia Likely secondary to GI losses. Repleted. Follow  Hypocalcemia Patient was given 4 g of IV calcium gluconate on 06/09/12. Corrected calcium today is 9.2. Follow per renal.  Anemia of chronic disease Patient with no overt GI bleed. Likely dilutional in nature and secondary to chronic kidney disease. Anemia panel consistent with anemia of chronic disease. H&H is stable. Follow.  Hypothyroidism TSH of 0.983. Continue current dose of Synthroid.  Flulike symptoms  -influenza panel negative,     Hypophosphatemia  Replete and monitor   Code Status: Full Family Communication: patient  Disposition Plan: home   Consultants:  Renal: Dr. Jonnie Finner 06/08/2012  [facility]   Procedures:  CT abdomen and pelvis 06/07/2012  Chest x-ray 06/03/2012  Antibiotics:  IV Rocephin 06/07/2012--->06/10/12  HPI/Subjective: Patient reports that diarrhea has stopped.   Objective: Filed Vitals:   06/10/12 1500 06/10/12 1825 06/10/12 2057 06/11/12 1008  BP: 127/73 140/66 122/61 119/77  Pulse: 84 77 62 71  Temp:  98.1 F (36.7 C) 98.9 F (37.2 C) 98.8 F (37.1 C)  TempSrc:  Oral Oral Oral  Resp: 12 20 17 18   Height:      Weight:   62.324 kg (137 lb 6.4 oz)   SpO2: 95% 96% 95% 95%    Intake/Output Summary (Last 24 hours) at 06/11/12 1404 Last data filed at 06/11/12 0850  Gross per 24 hour  Intake 2189.17 ml  Output    950 ml  Net 1239.17 ml  Filed Weights   06/07/12 1549 06/09/12 0400 06/10/12 2057  Weight: 52.9 kg (116 lb 10 oz) 57.8 kg (127 lb 6.8 oz) 62.324 kg (137 lb 6.4 oz)    Exam:   General:  NAD  Cardiovascular: RRR no m/r/g. No JVD, No LE edema  Respiratory: CTAB  Abdomen: Soft/NT/ND/+BS  Data Reviewed: Basic Metabolic Panel:  Lab 03/24/12 0610 06/10/12 1345 06/10/12  0345 06/09/12 1744 06/09/12 0325  NA 139 137 134* 132* 136  K 2.6* 2.4* 2.9* 2.4* <2.0*  CL 95* 97 98 95* 105  CO2 30 27 23 21  15*  GLUCOSE 129* 162* 132* 132* 133*  BUN 60* 61* 62* 62* 67*  CREATININE 9.55* 9.69* 10.15* 9.84* 10.59*  CALCIUM 6.9* 7.0* 7.4* 6.1* 5.5*  MG 1.8 1.9 1.9 2.1 0.8*  PHOS 2.0* -- -- -- 2.5   Liver Function Tests:  Lab 06/11/12 0610 06/09/12 0325 06/07/12 1210  AST -- -- 11  ALT -- -- 5  ALKPHOS -- -- 166*  BILITOT -- -- 0.3  PROT -- -- 6.3  ALBUMIN 2.0* 1.8* 2.6*    Lab 06/07/12 1210  LIPASE 39  AMYLASE --   No results found for this basename: AMMONIA:5 in the last 168 hours CBC:  Lab 06/11/12 0610 06/10/12 0345 06/09/12 0325 06/07/12 1210  WBC 9.4 9.1 7.6 8.8  NEUTROABS -- -- -- 6.4  HGB 9.8* 9.5* 9.8* 13.6  HCT 28.6* 26.8* 27.9* 41.1  MCV 90.8 89.0 90.9 96.7  PLT 145* 135* 120* 137*   Cardiac Enzymes: No results found for this basename: CKTOTAL:5,CKMB:5,CKMBINDEX:5,TROPONINI:5 in the last 168 hours BNP (last 3 results) No results found for this basename: PROBNP:3 in the last 8760 hours CBG: No results found for this basename: GLUCAP:5 in the last 168 hours  Recent Results (from the past 240 hour(s))  URINE CULTURE     Status: Normal   Collection Time   06/07/12 12:25 PM      Component Value Range Status Comment   Specimen Description URINE, CLEAN CATCH   Final    Special Requests NONE   Final    Culture  Setup Time 06/07/2012 22:26   Final    Colony Count 50,000 COLONIES/ML   Final    Culture     Final    Value: Multiple bacterial morphotypes present, none predominant. Suggest appropriate recollection if clinically indicated.   Report Status 06/08/2012 FINAL   Final   MRSA PCR SCREENING     Status: Normal   Collection Time   06/07/12  3:28 PM      Component Value Range Status Comment   MRSA by PCR NEGATIVE  NEGATIVE Final   CULTURE, BLOOD (ROUTINE X 2)     Status: Normal (Preliminary result)   Collection Time   06/07/12 10:35 PM       Component Value Range Status Comment   Specimen Description BLOOD LEFT ANTECUBITAL   Final    Special Requests BOTTLES DRAWN AEROBIC AND ANAEROBIC 3CC   Final    Culture  Setup Time 06/08/2012 07:53   Final    Culture     Final    Value:        BLOOD CULTURE RECEIVED NO GROWTH TO DATE CULTURE WILL BE HELD FOR 5 DAYS BEFORE ISSUING A FINAL NEGATIVE REPORT   Report Status PENDING   Incomplete   CLOSTRIDIUM DIFFICILE BY PCR     Status: Normal   Collection Time   06/07/12 10:37 PM  Component Value Range Status Comment   C difficile by pcr NEGATIVE  NEGATIVE Final   STOOL CULTURE     Status: Normal (Preliminary result)   Collection Time   06/07/12 10:38 PM      Component Value Range Status Comment   Specimen Description STOOL   Final    Special Requests NONE   Final    Culture Culture reincubated for better growth   Final    Report Status PENDING   Incomplete   CULTURE, BLOOD (ROUTINE X 2)     Status: Normal (Preliminary result)   Collection Time   06/07/12 10:45 PM      Component Value Range Status Comment   Specimen Description BLOOD RIGHT HAND   Final    Special Requests BOTTLES DRAWN AEROBIC ONLY 1.5CC   Final    Culture  Setup Time 06/08/2012 07:54   Final    Culture     Final    Value:        BLOOD CULTURE RECEIVED NO GROWTH TO DATE CULTURE WILL BE HELD FOR 5 DAYS BEFORE ISSUING A FINAL NEGATIVE REPORT   Report Status PENDING   Incomplete      Studies: No results found.  Scheduled Meds:    . enoxaparin (LOVENOX) injection  30 mg Subcutaneous Q24H  . feeding supplement  1 Container Oral BID BM  . folic acid  1 mg Oral Daily  . hydrocortisone sod succinate (SOLU-CORTEF) injection  50 mg Intravenous Q8H  . levothyroxine  125 mcg Oral QAC breakfast  . loperamide  2 mg Oral TID  . pantoprazole (PROTONIX) IV  40 mg Intravenous Q12H  . potassium chloride  40 mEq Oral TID  . saccharomyces boulardii  250 mg Oral BID  . sodium chloride  3 mL Intravenous Q12H  . vitamin  B-12  1,000 mcg Oral Daily   Continuous Infusions:    .  sodium bicarbonate infusion 1000 mL 100 mL/hr at 06/10/12 1516    Principal Problem:  *Acute on chronic kidney disease, stage 4 Active Problems:  Acute renal failure  Dehydration  Crohn's disease  Metabolic acidosis  Hypokalemia  Diarrhea  UTI (lower urinary tract infection)  Anemia  Hypomagnesemia  CKD (chronic kidney disease)  Hypocalcemia  Hypothyroidism  Salmonella enteritidis      Docia Klar  [facility] Pager [phone]. If 8PM-8AM, please contact night-coverage at [email or web address], password [facility] 06/11/2012, 2:04 PM  LOS: 4 days

## 2012-06-11 NOTE — Progress Notes (Signed)
ANTIBIOTIC CONSULT NOTE - INITIAL  Pharmacy Consult for Cipro Indication: Salmonella in stool culture  Allergies  Allergen Reactions  . Mercaptopurine     unknown  . Remicade (Infliximab)     unknown    Patient Measurements: Height: 5' 5"  (165.1 cm) Weight: 137 lb 6.4 oz (62.324 kg) IBW/kg (Calculated) : 57   Vital Signs: Temp: 97.8 F (36.6 C) (01/17 1400) Temp src: Oral (01/17 1400) BP: 116/70 mmHg (01/17 1400) Pulse Rate: 76  (01/17 1400) Intake/Output from previous day: 01/16 0701 - 01/17 0700 In: 1832.2 [P.O.:220; I.V.:1612.2] Out: 1200 [Urine:1200] Intake/Output from this shift: Total I/O In: 1500 [P.O.:600; I.V.:900] Out: -   Labs:  Basename 06/11/12 0610 06/10/12 1345 06/10/12 0345 06/09/12 0325  WBC 9.4 -- 9.1 7.6  HGB 9.8* -- 9.5* 9.8*  PLT 145* -- 135* 120*  LABCREA -- -- -- --  CREATININE 9.55* 9.69* 10.15* --   Estimated Creatinine Clearance: 5.2 ml/min (by C-G formula based on Cr of 9.55). No results found for this basename: VANCOTROUGH:2,VANCOPEAK:2,VANCORANDOM:2,GENTTROUGH:2,GENTPEAK:2,GENTRANDOM:2,TOBRATROUGH:2,TOBRAPEAK:2,TOBRARND:2,AMIKACINPEAK:2,AMIKACINTROU:2,AMIKACIN:2, in the last 72 hours   Microbiology: Recent Results (from the past 720 hour(s))  URINE CULTURE     Status: Normal   Collection Time   06/07/12 12:25 PM      Component Value Range Status Comment   Specimen Description URINE, CLEAN CATCH   Final    Special Requests NONE   Final    Culture  Setup Time 06/07/2012 22:26   Final    Colony Count 50,000 COLONIES/ML   Final    Culture     Final    Value: Multiple bacterial morphotypes present, none predominant. Suggest appropriate recollection if clinically indicated.   Report Status 06/08/2012 FINAL   Final   MRSA PCR SCREENING     Status: Normal   Collection Time   06/07/12  3:28 PM      Component Value Range Status Comment   MRSA by PCR NEGATIVE  NEGATIVE Final   CULTURE, BLOOD (ROUTINE X 2)     Status: Normal (Preliminary  result)   Collection Time   06/07/12 10:35 PM      Component Value Range Status Comment   Specimen Description BLOOD LEFT ANTECUBITAL   Final    Special Requests BOTTLES DRAWN AEROBIC AND ANAEROBIC 3CC   Final    Culture  Setup Time 06/08/2012 07:53   Final    Culture     Final    Value:        BLOOD CULTURE RECEIVED NO GROWTH TO DATE CULTURE WILL BE HELD FOR 5 DAYS BEFORE ISSUING A FINAL NEGATIVE REPORT   Report Status PENDING   Incomplete   CLOSTRIDIUM DIFFICILE BY PCR     Status: Normal   Collection Time   06/07/12 10:37 PM      Component Value Range Status Comment   C difficile by pcr NEGATIVE  NEGATIVE Final   STOOL CULTURE     Status: Normal (Preliminary result)   Collection Time   06/07/12 10:38 PM      Component Value Range Status Comment   Specimen Description STOOL   Final    Special Requests NONE   Final    Culture     Final    Value: SALMONELLA SPECIES     Note: CRITICAL RESULT CALLED TO, READ BACK BY AND VERIFIED WITH: ALLISON CHARGE RN @ 915AM BY VINCJ 06/11/12   Report Status PENDING   Incomplete    Organism ID, Bacteria SALMONELLA SPECIES  Final   CULTURE, BLOOD (ROUTINE X 2)     Status: Normal (Preliminary result)   Collection Time   06/07/12 10:45 PM      Component Value Range Status Comment   Specimen Description BLOOD RIGHT HAND   Final    Special Requests BOTTLES DRAWN AEROBIC ONLY 1.5CC   Final    Culture  Setup Time 06/08/2012 07:54   Final    Culture     Final    Value:        BLOOD CULTURE RECEIVED NO GROWTH TO DATE CULTURE WILL BE HELD FOR 5 DAYS BEFORE ISSUING A FINAL NEGATIVE REPORT   Report Status PENDING   Incomplete     Medical History: Past Medical History  Diagnosis Date  . Chronic kidney disease   . GERD (gastroesophageal reflux disease)   . Crohn's colitis   . CKD (chronic kidney disease) 06/09/2012  . Hypothyroidism 06/10/2012    Medications:  Anti-infectives     Start     Dose/Rate Route Frequency Ordered Stop   06/07/12 2200    oseltamivir (TAMIFLU) capsule 75 mg  Status:  Discontinued        75 mg Oral 2 times daily 06/07/12 1738 06/07/12 1808   06/07/12 1830   oseltamivir (TAMIFLU) capsule 75 mg  Status:  Discontinued        75 mg Oral Daily 06/07/12 1809 06/08/12 1003   06/07/12 1800   cefTRIAXone (ROCEPHIN) 1 g in dextrose 5 % 50 mL IVPB  Status:  Discontinued        1 g 100 mL/hr over 30 Minutes Intravenous Every 24 hours 06/07/12 1723 06/10/12 6962         Assessment: 67 year old female found to have salmonella in her stool culture.  To receive 7 days of Cipro therapy.  She has acute on chronic kidney disease and her CrCl is <30 at her baseline, so will use an adjusted Cipro regimen.  Goal of Therapy:  Eradication of infection  Plan:  Cipro 554m PO q24h x 7 days As no dosage adjustments are anticipated pharmacy will sign off.  Thank you for the consult.  MLegrand Como Pharm.D., BCPS Clinical Pharmacist  Phone [phone] [phone]/17/2014, 5:42 PM

## 2012-06-11 NOTE — Evaluation (Signed)
Physical Therapy Evaluation Patient Details Name: Natasha [name] MRN: [id number] DOB: 06-Dec-1945 Today's Date: 06/11/2012 Time: 2585-2778 PT Time Calculation (min): 31 min  PT Assessment / Plan / Recommendation Clinical Impression  Pt. Was transferred from [facility] with flu like illness one week PTA,  hypotension, possible crohns flare up, dehydration and  CKD.  She presents to PT with a decreased in her overall strength, generalized weakness, decreased activity tolerance and will benefit from acute PT to address these issues for return home.  Pt. commented that she is surprised at how weak she is right now compared to her baseline.      PT Assessment  Patient needs continued PT services    Follow Up Recommendations  Home health PT;Supervision/Assistance - 24 hour    Does the patient have the potential to tolerate intense rehabilitation      Barriers to Discharge None      Equipment Recommendations  Rolling walker with 5" wheels;Other (comment) (3n1 commode)    Recommendations for Other Services     Frequency Min 3X/week    Precautions / Restrictions Precautions Precautions: Fall Restrictions Weight Bearing Restrictions: No   Pertinent Vitals/Pain No pain, no distress      Mobility  Bed Mobility Bed Mobility: Supine to Sit;Sitting - Scoot to Edge of Bed Supine to Sit: 6: Modified independent (Device/Increase time);HOB elevated;With rails Sitting - Scoot to Edge of Bed: 7: Independent Details for Bed Mobility Assistance: manages well, light use of bed rails Transfers Transfers: Sit to Stand;Stand to Sit Sit to Stand: 4: Min assist;From bed;With upper extremity assist Stand to Sit: 4: Min assist;To chair/3-in-1;With armrests Details for Transfer Assistance: needed cueing for correct technique and hand placement Ambulation/Gait Ambulation/Gait Assistance: 4: Min assist Ambulation Distance (Feet): 100 Feet Assistive device: Rolling walker Ambulation/Gait  Assistance Details: Pt. with mild unsteadiness and generalized weakness while up walking, needed min assist for safety and stability Gait Pattern: Step-through pattern Gait velocity: slowed Stairs: No Wheelchair Mobility Wheelchair Mobility: No    Shoulder Instructions     Exercises     PT Diagnosis: Difficulty walking;Generalized weakness  PT Problem List: Decreased strength;Decreased activity tolerance;Decreased balance;Decreased mobility;Decreased knowledge of use of DME PT Treatment Interventions: DME instruction;Gait training;Functional mobility training;Therapeutic activities;Therapeutic exercise;Balance training   PT Goals Acute Rehab PT Goals PT Goal Formulation: With patient Time For Goal Achievement: 06/18/12 Potential to Achieve Goals: Good Pt will go Supine/Side to Sit: with modified independence PT Goal: Supine/Side to Sit - Progress: Goal set today Pt will go Sit to Supine/Side: with modified independence PT Goal: Sit to Supine/Side - Progress: Goal set today Pt will go Sit to Stand: with modified independence PT Goal: Sit to Stand - Progress: Goal set today Pt will go Stand to Sit: with modified independence PT Goal: Stand to Sit - Progress: Goal set today Pt will Transfer Bed to Chair/Chair to Bed: with modified independence PT Transfer Goal: Bed to Chair/Chair to Bed - Progress: Goal set today Pt will Ambulate: >150 feet;with modified independence;with least restrictive assistive device PT Goal: Ambulate - Progress: Goal set today  Visit Information  Last PT Received On: 06/11/12 Assistance Needed: +1    Subjective Data  Subjective: Starting to feel a little bit better Patient Stated Goal: home and taking care of self   Prior Functioning  Home Living Lives With: Spouse Available Help at Discharge: Available 24 hours/day;Family Type of Home: House Home Access: Level entry Home Layout: One level Bathroom Shower/Tub: Tub/shower unit;Door [employer]:  Standard Home Adaptive Equipment: None Prior Function Level of Independence: Independent Able to Take Stairs?: Yes Driving: Yes Vocation: Retired Corporate investment banker: No difficulties Dominant Hand: Right    Cognition  Overall Cognitive Status: Appears within functional limits for tasks assessed/performed Arousal/Alertness: Awake/alert Orientation Level: Oriented X4 / Intact Behavior During Session: WFL for tasks performed    Extremity/Trunk Assessment Right Upper Extremity Assessment RUE ROM/Strength/Tone: [facility] for tasks assessed Left Upper Extremity Assessment LUE ROM/Strength/Tone: WFL for tasks assessed Right Lower Extremity Assessment RLE ROM/Strength/Tone: Within functional levels RLE Sensation: WFL - Light Touch RLE Coordination: WFL - gross/fine motor Left Lower Extremity Assessment LLE ROM/Strength/Tone: Within functional levels LLE Sensation: WFL - Light Touch LLE Coordination: WFL - gross/fine motor Trunk Assessment Trunk Assessment: Normal   Balance    End of Session PT - End of Session Equipment Utilized During Treatment: Gait belt Activity Tolerance: Patient limited by fatigue Patient left: in chair;with call bell/phone within reach Nurse Communication: Mobility status  GP     Ladona Ridgel 06/11/2012, 10:15 AM Gerlean Ren PT Acute Rehab Services [phone] Beeper [phone]

## 2012-06-11 NOTE — Evaluation (Signed)
Occupational Therapy Evaluation Patient Details Name: Natasha [name] MRN: [id number] DOB: 04-May-1946 Today's Date: 06/11/2012 Time: 1093-2355 OT Time Calculation (min): 20 min  OT Assessment / Plan / Recommendation Clinical Impression  67 yo female admitted with Crohn's flare up from [facility] with flu like illness one week PTA,  hypotension,  dehydration and  CKD. OT to follow acutely and Recommend HHOT for d/c planning. Pt demonstrates deficits with balance and activity tolerance.     OT Assessment  Patient needs continued OT Services    Follow Up Recommendations  Home health OT    Barriers to Discharge      Equipment Recommendations  None recommended by OT    Recommendations for Other Services    Frequency  Min 2X/week    Precautions / Restrictions Precautions Precautions: Fall Restrictions Weight Bearing Restrictions: No   Pertinent Vitals/Pain No pain reported at this time Pt is experiencing urgency for using rest room Pt with + bowel and bladder void this session    ADL  Grooming: Wash/dry hands;Supervision/safety Where Assessed - Grooming: Unsupported standing Toilet Transfer: Min Psychiatric nurse Method: Sit to Loss adjuster, chartered: Raised toilet seat with arms (or 3-in-1 over toilet) Toileting - Clothing Manipulation and Hygiene: Min guard Where Assessed - Toileting Clothing Manipulation and Hygiene: Sit to stand from 3-in-1 or toilet Equipment Used: Gait belt;Rolling walker Transfers/Ambulation Related to ADLs: Pt ambulating to restroom with urgency. pt (A) with hand held (A) to bathroom due to patient's urgency. pt required Min (A). Pt completed all remaining ambulation with RW at [facility] guard (A). ADL Comments: Pt upset on arrival but after ADL nees met patient now comfortable. Pt completed chair transfer, 3n1 transfer, sink level grooming, ambulation to assess safety with RW for adls and bed mobility. RN called to room to address IV beeping.      OT Diagnosis: Generalized weakness  OT Problem List: Decreased strength;Decreased activity tolerance;Impaired balance (sitting and/or standing);Decreased safety awareness;Decreased knowledge of use of DME or AE;Decreased knowledge of precautions OT Treatment Interventions: Self-care/ADL training;DME and/or AE instruction;Therapeutic activities;Patient/family education;Balance training   OT Goals Acute Rehab OT Goals OT Goal Formulation: With patient Time For Goal Achievement: 06/25/12 Potential to Achieve Goals: Good ADL Goals Pt Will Perform Grooming: with modified independence;Standing at sink;Unsupported ADL Goal: Grooming - Progress: Goal set today Pt Will Perform Upper Body Bathing: with modified independence;Standing at sink ADL Goal: Upper Body Bathing - Progress: Goal set today Pt Will Perform Lower Body Bathing: with modified independence;Standing at sink ADL Goal: Lower Body Bathing - Progress: Goal set today Pt Will Perform Upper Body Dressing: with modified independence;Sit to stand from chair ADL Goal: Upper Body Dressing - Progress: Goal set today Pt Will Perform Lower Body Dressing: with modified independence;Sit to stand from chair ADL Goal: Lower Body Dressing - Progress: Goal set today Pt Will Transfer to Toilet: with modified independence;with DME ADL Goal: Toilet Transfer - Progress: Goal set today Pt Will Perform Toileting - Hygiene: with modified independence;Sit to stand from 3-in-1/toilet ADL Goal: Toileting - Hygiene - Progress: Goal set today  Visit Information  Last OT Received On: 06/11/12 Assistance Needed: +1    Subjective Data  Subjective: "I am sorry to be ugly earlier I just needed to go"- Pt reports with Ot arrival long wait to void bowel and bladder. Pt anxious and upset. OT assisted patient to restroom and pt now comfortable. 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 difficulties Dominant Hand: Right         Vision/Perception     Cognition  Overall Cognitive Status: Appears within functional limits for tasks assessed/performed Arousal/Alertness: Awake/alert Orientation Level: Oriented X4 / Intact Behavior During Session: WFL for tasks performed    Extremity/Trunk Assessment Right Upper Extremity Assessment RUE ROM/Strength/Tone: WFL for tasks assessed RUE Sensation: WFL - Light Touch RUE Coordination: WFL - gross/fine motor Left Upper Extremity Assessment LUE ROM/Strength/Tone: WFL for tasks assessed LUE Sensation: WFL - Light Touch LUE Coordination: WFL - gross/fine motor Trunk Assessment Trunk Assessment: Normal     Mobility Bed Mobility Sit to Supine: 6: Modified independent (Device/Increase time);HOB flat Details for Bed Mobility Assistance: bed mobility WFL Transfers Sit to Stand: 4: Min guard Stand to Sit: 4: Min guard Details for Transfer Assistance: needs v/c for safety with RW and hand placement     Shoulder Instructions     Exercise     Balance High Level Balance High Level Balance Activites: Turns High Level Balance Comments: Pt with > 4 steps and decr speed to turn with RW. Pt with poor body position in RW and moderate fall risk   End of Session OT - End of Session Activity Tolerance: Patient tolerated treatment well Patient left: in bed;with call bell/phone within reach Nurse Communication: Mobility status;Precautions  GO     Veneda Melter 06/11/2012, 2:20 PM Pager: [phone]

## 2012-06-12 LAB — BASIC METABOLIC PANEL
Chloride: 96 mEq/L (ref 96–112)
GFR calc Af Amer: 5 mL/min — ABNORMAL LOW (ref >90)
GFR calc non Af Amer: 4 mL/min — ABNORMAL LOW (ref >90)
Potassium: 2.4 mEq/L — CL (ref 3.5–5.1)
Sodium: 140 mEq/L (ref 135–145)

## 2012-06-12 LAB — MAGNESIUM: Magnesium: 1.5 mg/dL (ref 1.5–2.5)

## 2012-06-12 MED ORDER — POTASSIUM CHLORIDE CRYS ER 20 MEQ PO TBCR
40.0000 meq | EXTENDED_RELEASE_TABLET | Freq: Three times a day (TID) | ORAL | Status: AC
  Administered 2012-06-12 (×3): 40 meq via ORAL
  Filled 2012-06-12 (×2): qty 2

## 2012-06-12 MED ORDER — PREDNISONE 5 MG PO TABS
5.0000 mg | ORAL_TABLET | Freq: Every day | ORAL | Status: DC
  Administered 2012-06-12 – 2012-06-15 (×4): 5 mg via ORAL
  Filled 2012-06-12 (×4): qty 1

## 2012-06-12 MED ORDER — POTASSIUM CHLORIDE 10 MEQ/100ML IV SOLN
10.0000 meq | INTRAVENOUS | Status: DC
  Administered 2012-06-12 (×2): 10 meq via INTRAVENOUS
  Filled 2012-06-12 (×4): qty 100

## 2012-06-12 MED ORDER — MAGNESIUM SULFATE 40 MG/ML IJ SOLN
2.0000 g | Freq: Once | INTRAMUSCULAR | Status: AC
  Administered 2012-06-12: 2 g via INTRAVENOUS
  Filled 2012-06-12: qty 50

## 2012-06-12 MED ORDER — SODIUM CHLORIDE 0.9 % IV SOLN
1.0000 g | Freq: Once | INTRAVENOUS | Status: AC
  Administered 2012-06-12: 1 g via INTRAVENOUS
  Filled 2012-06-12: qty 10

## 2012-06-12 MED ORDER — SODIUM CHLORIDE 0.9 % IV SOLN
INTRAVENOUS | Status: DC
  Administered 2012-06-12 – 2012-06-13 (×2): via INTRAVENOUS

## 2012-06-12 NOTE — Progress Notes (Signed)
Patient ID: Natasha [name], female   DOB: 06/30/1945, 67 y.o.   MRN: [id number] [facility]ology Progress Note  Subjective: Abd pain, diarrhea improveing, tolerating regular diet  Objective: Vital signs in last 24 hours: Temp:  [97.8 F (36.6 C)-98.9 F (37.2 C)] 98 F (36.7 C) (01/18 0543) Pulse Rate:  [70-85] 70  (01/18 0543) Resp:  [18-19] 18  (01/18 0543) BP: (116-126)/(61-77) 118/61 mmHg (01/18 0543) SpO2:  [94 %-99 %] 94 % (01/18 0543) Weight:  [67.178 kg (148 lb 1.6 oz)] 67.178 kg (148 lb 1.6 oz) (01/17 2138) Weight change: 4.853 kg (10 lb 11.2 oz)   XK:GYJEHUD soft, nontender  Lab Results: Results for orders placed during the hospital encounter of 06/07/12 (from the past 24 hour(s))  PHOSPHORUS     Status: Abnormal   Collection Time   06/11/12  2:55 PM      Component Value Range   Phosphorus 2.0 (*) 2.3 - 4.6 mg/dL  BASIC METABOLIC PANEL     Status: Abnormal   Collection Time   06/12/12  5:22 AM      Component Value Range   Sodium 140  135 - 145 mEq/L   Potassium 2.4 (*) 3.5 - 5.1 mEq/L   Chloride 96  96 - 112 mEq/L   CO2 36 (*) 19 - 32 mEq/L   Glucose, Bld 82  70 - 99 mg/dL   BUN 60 (*) 6 - 23 mg/dL   Creatinine, Ser 8.27 (*) 0.50 - 1.10 mg/dL   Calcium 6.8 (*) 8.4 - 10.5 mg/dL   GFR calc non Af Amer 4 (*) >90 mL/min   GFR calc Af Amer 5 (*) >90 mL/min  MAGNESIUM     Status: Normal   Collection Time   06/12/12  5:22 AM      Component Value Range   Magnesium 1.5  1.5 - 2.5 mg/dL    Studies/Results: No results found.    Assessment:  Salmonella gastroenteritis Crohn's disease  Plan:  Cont cipro per ID with stress dose steroids. Will s/o for now. Will arrange outpt fu with Dr Cleotis Nipper C 06/12/2012, 9:55 AM

## 2012-06-12 NOTE — Progress Notes (Signed)
[facility] PROGRESS NOTE  Natasha [name] ZOX:[id number] DOB: 11-Mar-1946 DOA: 06/07/2012 PCP: Garlan Fair, MD  Assessment/Plan:  Nausea/ vomiting/Diarrhea/?? Crohn flare - most likely though due to Salmonella enteritis  -. Patient states was taken off asacol PTA. -C. difficile negative  -She is on chronic steroids for her Crohn's and we did prescribe Hydrocortisone 50 mg iv every 8 hours after admission. Patient changed back to prednisone 5 mg daily on 06/12/12 after it became clear that her current episode is due to infection and not a Crohn's flare.  -CT scan of abdomen and pelvis with oral contrast only-shows mild circumferential wall thickening suggesting inflammatory bowel disease. Negative for abscess or perforation.   - [facility] consulted on 1/16.  - Micro lab called on 06/11/12 and reported that the patient is growing Salmonella in the stool culture  - ID consultant recommended Cipro for 5 days on 1/17.   Hypotension  -Likely secondary to hypovolemia due to #1, she is also on chronic prednisone so covering with stress dose steroids as above -Continue Hydration with IV fluids  -Her urinalysis was consistent with a UTI. Urine cultures with 50,000 morphotypes. Patient has had 3 days of IV Rocephin.  Acute on chronic renal failure(CKD 4) baseline 2-3 -Patient's last creatinine on 1/9 was 2.68, and up to 9.21on 1/13 and worsening 1/14 10.59 and on 1/17 is down to  9.5 Patient is still making good urine output and creatinine continue sto improve  - UOP 1200 cc last 24 hrs  - Dehydration, hypotension, UTI, likely contributing factors, possibly ATN  - [facility] Dr. Jonnie Finner consulted, and due to CT scan being negative for hydronephrosis no renal ultrasound is needed at this time. Patient was treated with IV fluids with sodium bicarbonate   Dehydration  -Hydration with IV fluids follow and recheck.   Probable urinary tract infection  -Urine cultures with 50,000 colonies of multiple  bacterial morphotypes. Patient was started on Rocephin on 06/07/2012. Patient has had 3 days of IV antibiotics. Will discontinue IV Rocephin.   Metabolic acidosis  -Secondary to acute on chronic renal failure, placed on bicarbonate. Acidosis resolved with iv bicarbonate on 06/11/12  Severe Hypokalemia  -Secondary to GI losses. Patient did have a significant hypomagnesemia with a magnesium level of 0.8 which has been repleted. Will continue to replete   Severe hypomagnesemia Likely secondary to GI losses. Repleted. Follow  Hypocalcemia Patient was given 4 g of IV calcium gluconate on 06/09/12. Patient was given again Calcium gluconate at 1 g iv on 06/12/12.   Anemia of chronic disease Patient with no overt GI bleed. Likely dilutional in nature and secondary to chronic kidney disease. Anemia panel consistent with anemia of chronic disease. H&H is stable. Follow.  Hypothyroidism TSH of 0.983. Continue current dose of Synthroid.  Flulike symptoms  -influenza panel negative,     Hypophosphatemia  Replete and monitor   Code Status: Full Family Communication: patient  Disposition Plan: home   Consultants:  Renal: Dr. Jonnie Finner 06/08/2012  [facility]   Procedures:  CT abdomen and pelvis 06/07/2012  Chest x-ray 06/03/2012  Antibiotics:  IV Rocephin 06/07/2012--->06/10/12  HPI/Subjective: Patient reports feeling better   Objective: Filed Vitals:   06/11/12 1400 06/11/12 1746 06/11/12 2138 06/12/12 0543  BP: 116/70 126/73 117/69 118/61  Pulse: 76 72 85 70  Temp: 97.8 F (36.6 C) 98.5 F (36.9 C) 98.9 F (37.2 C) 98 F (36.7 C)  TempSrc: Oral Oral Oral Oral  Resp: 18 18  19 18  Height:      Weight:   67.178 kg (148 lb 1.6 oz)   SpO2: 96% 98% 99% 94%    Intake/Output Summary (Last 24 hours) at 06/12/12 0842 Last data filed at 06/12/12 0600  Gross per 24 hour  Intake   3140 ml  Output      0 ml  Net   3140 ml   Filed Weights   06/09/12 0400  06/10/12 2057 06/11/12 2138  Weight: 57.8 kg (127 lb 6.8 oz) 62.324 kg (137 lb 6.4 oz) 67.178 kg (148 lb 1.6 oz)    Exam:   General:  NAD  Cardiovascular: RRR no m/r/g. No JVD, No LE edema  Respiratory: CTAB  Abdomen: Soft/NT/ND/+BS  Data Reviewed: Basic Metabolic Panel:  Lab 05/39/76 0522 06/11/12 1455 06/11/12 0610 06/10/12 1345 06/10/12 0345 06/09/12 1744 06/09/12 0325  NA 140 -- 139 137 134* 132* --  K 2.4* -- 2.6* 2.4* 2.9* 2.4* --  CL 96 -- 95* 97 98 95* --  CO2 36* -- 30 27 23 21  --  GLUCOSE 82 -- 129* 162* 132* 132* --  BUN 60* -- 60* 61* 62* 62* --  CREATININE 8.27* -- 9.55* 9.69* 10.15* 9.84* --  CALCIUM 6.8* -- 6.9* 7.0* 7.4* 6.1* --  MG 1.5 -- 1.8 1.9 1.9 2.1 --  PHOS -- 2.0* 2.0* -- -- -- 2.5   Liver Function Tests:  Lab 06/11/12 0610 06/09/12 0325 06/07/12 1210  AST -- -- 11  ALT -- -- 5  ALKPHOS -- -- 166*  BILITOT -- -- 0.3  PROT -- -- 6.3  ALBUMIN 2.0* 1.8* 2.6*    Lab 06/07/12 1210  LIPASE 39  AMYLASE --   No results found for this basename: AMMONIA:5 in the last 168 hours CBC:  Lab 06/11/12 0610 06/10/12 0345 06/09/12 0325 06/07/12 1210  WBC 9.4 9.1 7.6 8.8  NEUTROABS -- -- -- 6.4  HGB 9.8* 9.5* 9.8* 13.6  HCT 28.6* 26.8* 27.9* 41.1  MCV 90.8 89.0 90.9 96.7  PLT 145* 135* 120* 137*   Cardiac Enzymes: No results found for this basename: CKTOTAL:5,CKMB:5,CKMBINDEX:5,TROPONINI:5 in the last 168 hours BNP (last 3 results) No results found for this basename: PROBNP:3 in the last 8760 hours CBG: No results found for this basename: GLUCAP:5 in the last 168 hours  Recent Results (from the past 240 hour(s))  URINE CULTURE     Status: Normal   Collection Time   06/07/12 12:25 PM      Component Value Range Status Comment   Specimen Description URINE, CLEAN CATCH   Final    Special Requests NONE   Final    Culture  Setup Time 06/07/2012 22:26   Final    Colony Count 50,000 COLONIES/ML   Final    Culture     Final    Value: Multiple  bacterial morphotypes present, none predominant. Suggest appropriate recollection if clinically indicated.   Report Status 06/08/2012 FINAL   Final   MRSA PCR SCREENING     Status: Normal   Collection Time   06/07/12  3:28 PM      Component Value Range Status Comment   MRSA by PCR NEGATIVE  NEGATIVE Final   CULTURE, BLOOD (ROUTINE X 2)     Status: Normal (Preliminary result)   Collection Time   06/07/12 10:35 PM      Component Value Range Status Comment   Specimen Description BLOOD LEFT ANTECUBITAL   Final    Special Requests  BOTTLES DRAWN AEROBIC AND ANAEROBIC 3CC   Final    Culture  Setup Time 06/08/2012 07:53   Final    Culture     Final    Value:        BLOOD CULTURE RECEIVED NO GROWTH TO DATE CULTURE WILL BE HELD FOR 5 DAYS BEFORE ISSUING A FINAL NEGATIVE REPORT   Report Status PENDING   Incomplete   CLOSTRIDIUM DIFFICILE BY PCR     Status: Normal   Collection Time   06/07/12 10:37 PM      Component Value Range Status Comment   C difficile by pcr NEGATIVE  NEGATIVE Final   STOOL CULTURE     Status: Normal (Preliminary result)   Collection Time   06/07/12 10:38 PM      Component Value Range Status Comment   Specimen Description STOOL   Final    Special Requests NONE   Final    Culture     Final    Value: SALMONELLA SPECIES     Note: CRITICAL RESULT CALLED TO, READ BACK BY AND VERIFIED WITH: ALLISON CHARGE RN @ 915AM BY VINCJ 06/11/12   Report Status PENDING   Incomplete    Organism ID, Bacteria SALMONELLA SPECIES   Final   CULTURE, BLOOD (ROUTINE X 2)     Status: Normal (Preliminary result)   Collection Time   06/07/12 10:45 PM      Component Value Range Status Comment   Specimen Description BLOOD RIGHT HAND   Final    Special Requests BOTTLES DRAWN AEROBIC ONLY 1.5CC   Final    Culture  Setup Time 06/08/2012 07:54   Final    Culture     Final    Value:        BLOOD CULTURE RECEIVED NO GROWTH TO DATE CULTURE WILL BE HELD FOR 5 DAYS BEFORE ISSUING A FINAL NEGATIVE REPORT    Report Status PENDING   Incomplete      Studies: No results found.  Scheduled Meds:    . ciprofloxacin  500 mg Oral Q breakfast  . enoxaparin (LOVENOX) injection  30 mg Subcutaneous Q24H  . feeding supplement  1 Container Oral BID BM  . folic acid  1 mg Oral Daily  . hydrocortisone sod succinate (SOLU-CORTEF) injection  50 mg Intravenous Q8H  . levothyroxine  125 mcg Oral QAC breakfast  . loperamide  2 mg Oral TID  . pantoprazole (PROTONIX) IV  40 mg Intravenous Q12H  . phosphorus  250 mg Oral BID  . potassium chloride  10 mEq Intravenous Q1 Hr x 4  . saccharomyces boulardii  250 mg Oral BID  . sodium chloride  3 mL Intravenous Q12H  . vitamin B-12  1,000 mcg Oral Daily   Continuous Infusions:    .  sodium bicarbonate  infusion 1000 mL 100 mL/hr at 06/12/12 0754    Principal Problem:  *Acute on chronic kidney disease, stage 4 Active Problems:  Acute renal failure  Dehydration  Crohn's disease  Metabolic acidosis  Hypokalemia  Diarrhea  UTI (lower urinary tract infection)  Anemia  Hypomagnesemia  CKD (chronic kidney disease)  Hypocalcemia  Hypothyroidism  Salmonella enteritidis      Natasha [name]  [facility] Pager [phone]. If 8PM-8AM, please contact night-coverage at [email or web address], password [facility] 06/12/2012, 8:42 AM  LOS: 5 days

## 2012-06-12 NOTE — Progress Notes (Signed)
Physical Therapy Treatment Patient Details Name: Natasha [name] MRN: [id number] DOB: [birth year] Today's Date: 06/12/2012 Time: 1142-1206 PT Time Calculation (min): 24 min  PT Assessment / Plan / Recommendation Comments on Treatment Session  Pt. advancing with ambulation in hallway and she will benefit from walking to and from bathroom with nursing assist as well as hallway ambulation.  This was discussed with nursing tech Chermaine and with pt and her daughter,.      Follow Up Recommendations  Home health PT;Supervision/Assistance - 24 hour     Does the patient have the potential to tolerate intense rehabilitation     Barriers to Discharge        Equipment Recommendations  Rolling walker with 5" wheels;Other (comment)    Recommendations for Other Services    Frequency Min 3X/week   Plan Discharge plan remains appropriate    Precautions / Restrictions Precautions Precautions: Fall Restrictions Weight Bearing Restrictions: No   Pertinent Vitals/Pain No pain reported    Mobility  Bed Mobility Bed Mobility: Supine to Sit;Sitting - Scoot to Edge of Bed Supine to Sit: 6: Modified independent (Device/Increase time);HOB elevated;With rails Sitting - Scoot to Edge of Bed: 7: Independent Sit to Supine: Not Tested (comment) Details for Bed Mobility Assistance: managing well Transfers Transfers: Sit to Stand;Stand to Sit Sit to Stand: From bed;From chair/3-in-1;With upper extremity assist;With armrests;4: Min guard Stand to Sit: 4: Min guard;To chair/3-in-1;With upper extremity assist Details for Transfer Assistance: occasional reminders for hand placement needed Ambulation/Gait Ambulation/Gait Assistance: 4: Min guard Ambulation Distance (Feet): 110 Feet Assistive device: Rolling walker Ambulation/Gait Assistance Details: Pt. moving slowly with RW, but no overt balance loss and managing device well without physical assist Gait Pattern: Step-through pattern Gait velocity:  slowed Stairs: No Wheelchair Mobility Wheelchair Mobility: No    Exercises     PT Diagnosis:    PT Problem List:   PT Treatment Interventions:     PT Goals Acute Rehab PT Goals PT Goal: Supine/Side to Sit - Progress: Met PT Goal: Sit to Stand - Progress: Progressing toward goal PT Goal: Stand to Sit - Progress: Progressing toward goal PT Goal: Ambulate - Progress: Progressing toward goal  Visit Information  Last PT Received On: 06/12/12 Assistance Needed: +1    Subjective Data  Subjective: "feeling a little better each day"   Cognition  Overall Cognitive Status: Appears within functional limits for tasks assessed/performed Arousal/Alertness: Awake/alert Orientation Level: Oriented X4 / Intact Behavior During Session: [facility] for tasks performed    Balance     End of Session PT - End of Session Equipment Utilized During Treatment: Gait belt Activity Tolerance: Patient limited by fatigue Patient left: in chair;with call bell/phone within reach;with family/visitor present Nurse Communication: Mobility status   GP     Ladona Ridgel 06/12/2012, 12:19 PM Gerlean Ren PT Acute Rehab Services [place] [phone]

## 2012-06-13 LAB — RENAL FUNCTION PANEL
Albumin: 2.2 g/dL — ABNORMAL LOW (ref 3.5–5.2)
Calcium: 7 mg/dL — ABNORMAL LOW (ref 8.4–10.5)
Creatinine, Ser: 6.24 mg/dL — ABNORMAL HIGH (ref 0.50–1.10)
GFR calc Af Amer: 7 mL/min — ABNORMAL LOW (ref >90)
GFR calc non Af Amer: 6 mL/min — ABNORMAL LOW (ref >90)
Phosphorus: 1 mg/dL — CL (ref 2.3–4.6)
Sodium: 141 mEq/L (ref 135–145)

## 2012-06-13 MED ORDER — MAGNESIUM SULFATE 40 MG/ML IJ SOLN
2.0000 g | Freq: Once | INTRAMUSCULAR | Status: AC
  Administered 2012-06-13: 2 g via INTRAVENOUS
  Filled 2012-06-13: qty 50

## 2012-06-13 MED ORDER — PANTOPRAZOLE SODIUM 40 MG PO TBEC
40.0000 mg | DELAYED_RELEASE_TABLET | Freq: Two times a day (BID) | ORAL | Status: DC
  Administered 2012-06-13 – 2012-06-15 (×5): 40 mg via ORAL
  Filled 2012-06-13 (×4): qty 1

## 2012-06-13 MED ORDER — SODIUM CHLORIDE 0.45 % IV SOLN
INTRAVENOUS | Status: DC
  Administered 2012-06-13: 14:00:00 via INTRAVENOUS
  Filled 2012-06-13 (×3): qty 1000

## 2012-06-13 MED ORDER — POTASSIUM CHLORIDE CRYS ER 20 MEQ PO TBCR
40.0000 meq | EXTENDED_RELEASE_TABLET | Freq: Three times a day (TID) | ORAL | Status: AC
  Administered 2012-06-13 (×3): 40 meq via ORAL
  Filled 2012-06-13 (×3): qty 2

## 2012-06-13 MED ORDER — POTASSIUM PHOSPHATE DIBASIC 3 MMOLE/ML IV SOLN
40.0000 meq | Freq: Once | INTRAVENOUS | Status: AC
  Administered 2012-06-13: 40 meq via INTRAVENOUS
  Filled 2012-06-13: qty 9.09

## 2012-06-13 MED ORDER — CHOLESTYRAMINE LIGHT 4 G PO PACK
4.0000 g | PACK | Freq: Two times a day (BID) | ORAL | Status: DC
  Administered 2012-06-13 – 2012-06-15 (×5): 4 g via ORAL
  Filled 2012-06-13 (×7): qty 1

## 2012-06-13 NOTE — Progress Notes (Signed)
Subjective: Over 3 L UOP yesterday! Labs pending  Objective Vital signs in last 24 hours: Filed Vitals:   06/12/12 1742 06/12/12 1959 06/12/12 2300 06/13/12 0458  BP: 116/56 121/72  108/68  Pulse: 67 80  76  Temp: 98.1 F (36.7 C) 98.7 F (37.1 C)  98.1 F (36.7 C)  TempSrc: Oral Oral  Oral  Resp: 18 18  16   Height:      Weight:   65.635 kg (144 lb 11.2 oz)   SpO2: 98% 97%  97%   Weight change: -1.542 kg (-3 lb 6.4 oz)  Intake/Output Summary (Last 24 hours) at 06/13/12 1024 Last data filed at 06/13/12 0300  Gross per 24 hour  Intake   1840 ml  Output   1700 ml  Net    140 ml   Labs: Basic Metabolic Panel:  Lab 84/16/60 0522 06/11/12 1455 06/11/12 0610 06/10/12 1345 06/10/12 0345 06/09/12 1744 06/09/12 0325 06/08/12 0600  NA 140 -- 139 137 134* 132* 136 133*  K 2.4* -- 2.6* 2.4* 2.9* 2.4* <2.0* 2.1*  CL 96 -- 95* 97 98 95* 105 104  CO2 36* -- 30 27 23 21  15* 10*  GLUCOSE 82 -- 129* 162* 132* 132* 133* 115*  BUN 60* -- 60* 61* 62* 62* 67* 69*  CREATININE 8.27* -- 9.55* 9.69* 10.15* 9.84* 10.59* 10.49*  ALB -- -- -- -- -- -- -- --  CALCIUM 6.8* -- 6.9* 7.0* 7.4* 6.1* 5.5* 6.5*  PHOS -- 2.0* 2.0* -- -- -- 2.5 --   Liver Function Tests:  Lab 06/11/12 0610 06/09/12 0325 06/07/12 1210  AST -- -- 11  ALT -- -- 5  ALKPHOS -- -- 166*  BILITOT -- -- 0.3  PROT -- -- 6.3  ALBUMIN 2.0* 1.8* 2.6*    Lab 06/07/12 1210  LIPASE 39  AMYLASE --   No results found for this basename: AMMONIA:3 in the last 168 hours CBC:  Lab 06/11/12 0610 06/10/12 0345 06/09/12 0325 06/07/12 1210  WBC 9.4 9.1 7.6 8.8  NEUTROABS -- -- -- 6.4  HGB 9.8* 9.5* 9.8* 13.6  HCT 28.6* 26.8* 27.9* 41.1  MCV 90.8 89.0 90.9 96.7  PLT 145* 135* 120* 137*   PT/INR: @labrcntip (inr:5) Cardiac Enzymes: No results found for this basename: CKTOTAL:5,CKMB:5,CKMBINDEX:5,TROPONINI:5 in the last 168 hours CBG: No results found for this basename: GLUCAP:5 in the last 168 hours  Iron Studies:   Lab  06/09/12 0835  IRON 129  TIBC 151*  TRANSFERRIN --  FERRITIN 331*    Physical Exam:  Blood pressure 108/68, pulse 76, temperature 98.1 F (36.7 C), temperature source Oral, resp. rate 16, height 5' 5"  (1.651 m), weight 65.635 kg (144 lb 11.2 oz), SpO2 97.00%.  Gen: alert, older adult female, no distress, looks stronger and brighter today Skin: no rash, cyanosis  HEENT: EOMI, sclera anicteric, throat dry  Neck: no JVD, no LAN  Chest: clear bilat, no rales or rhonchi  CV: regular, no rub or gallop, no murmur, pedal pulses intact  Abdomen: soft, nontender, no ascites or HSM  Ext: no LE or UE edema  Neuro: alert, Ox3, no focal deficit, no asterixis   UA- modLE, large bld, 11-20 wbc, 3-6 rbc, many bact/epis, +gran casts  CT abd (oral contrast only) > no hydro   Impression/Plan  1. Acute on CRF- due to severe volume depletion and prob ATN; resolving, UOP great now, creatinine coming down. Labs pending. Continue IVF's wth NS at 75 cc/hr. 2. Salmonella gastroenteritis- on  cipro per ID  3. CKD IV, baseline creat 2-3 4. Crohn's disease 5. Hypokalemia due to diarrhea- still low, replace per primary 6. Hypomagnesemia due to diarrhea- s/p 4gm IV Mg 1/15 and 2 gm 1/18 7. Hypocalcemia due to advanced RF- s/p 4 gm IV Ca on 1/15 and 1 gm on 1/18   [facility] [phone] pgr    [phone] cell 06/13/2012, 10:24 AM

## 2012-06-13 NOTE — Progress Notes (Signed)
[facility] PROGRESS NOTE  Natasha [name] PJK:[id number] DOB: 1945-09-01 DOA: 06/07/2012 PCP: Garlan Fair, MD  Assessment/Plan:  Nausea/ vomiting/Diarrhea/?? Crohn flare - most likely though due to Salmonella enteritis  -. Patient states was taken off asacol PTA. -C. difficile negative  -She is on chronic steroids for her Crohn's and we did prescribe Hydrocortisone 50 mg iv every 8 hours after admission. Patient changed back to prednisone 5 mg daily on 06/12/12 after it became clear that her current episode is due to infection and not a Crohn's flare.  -CT scan of abdomen and pelvis with oral contrast only-shows mild circumferential wall thickening suggesting inflammatory bowel disease. Negative for abscess or perforation.   - [facility] consulted on 1/16.  - Micro lab called on 06/11/12 and reported that the patient is growing Salmonella in the stool culture  - ID consultant recommended Cipro for 5 days on 1/17.   Hypotension  -Likely secondary to hypovolemia due to #1, she is also on chronic prednisone so covering with stress dose steroids as above -Continue Hydration with IV fluids  -Her urinalysis was consistent with a UTI. Urine cultures with 50,000 morphotypes. Patient has had 3 days of IV Rocephin.  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  - Dehydration, hypotension, UTI, likely contributing factors, possibly ATN  - [facility] Dr. Jonnie Finner consulted, and due to CT scan being negative for hydronephrosis no renal ultrasound is needed at this time. Patient was treated with IV fluids with sodium bicarbonate   Dehydration  -Hydration with IV fluids follow and recheck.   Probable urinary tract infection  -Urine cultures with 50,000 colonies of multiple  bacterial morphotypes. Patient was started on Rocephin on 06/07/2012. Patient has had 3 days of IV antibiotics. .   Metabolic acidosis  -Secondary to acute on chronic renal failure, placed on bicarbonate. Acidosis resolved with iv bicarbonate on 06/11/12  Severe Hypokalemia  -Secondary to GI losses.monitor and replete iv and po   Severe hypomagnesemia Likely secondary to GI losses. Monitor and Replete iv   Hypocalcemia Patient was given 4 g of IV calcium gluconate on 06/09/12. Patient was given again Calcium gluconate at 1 g iv on 06/12/12.   Anemia of chronic disease Patient with no overt GI bleed. Likely dilutional in nature and secondary to chronic kidney disease. Anemia panel consistent with anemia of chronic disease. H&H is stable. Follow.  Hypothyroidism TSH of 0.983. Continue current dose of Synthroid.  Flulike symptoms  -influenza panel negative,     Hypophosphatemia  Replete and monitor   Code Status: Full Family Communication: patient  Disposition Plan: home   Consultants:  Renal: Dr. Jonnie Finner 06/08/2012  [facility]   Procedures:  CT abdomen and pelvis 06/07/2012  Chest x-ray 06/03/2012  Antibiotics:  IV Rocephin 06/07/2012--->06/10/12  HPI/Subjective: Patient reports feeling better , still having watery diarrhea  Objective: Filed Vitals:   06/12/12 1742 06/12/12 1959 06/12/12 2300 06/13/12 0458  BP: 116/56 121/72  108/68  Pulse: 67 80  76  Temp: 98.1 F (36.7 C) 98.7 F (37.1 C)  98.1 F (36.7 C)  TempSrc: Oral Oral  Oral  Resp: 18 18  16   Height:      Weight:   65.635  kg (144 lb 11.2 oz)   SpO2: 98% 97%  97%    Intake/Output Summary (Last 24 hours) at 06/13/12 1050 Last data filed at 06/13/12 0300  Gross per 24 hour  Intake   1840 ml  Output   1700 ml  Net    140 ml   Filed Weights   06/10/12 2057 06/11/12 2138 06/12/12 2300  Weight: 62.324 kg (137 lb 6.4 oz) 67.178 kg (148 lb 1.6 oz) 65.635 kg (144 lb 11.2 oz)     Exam:   General:  NAD  Cardiovascular: RRR no m/r/g. No JVD, No LE edema  Respiratory: CTAB  Abdomen: Soft/NT/ND/+BS  Data Reviewed: Basic Metabolic Panel:  Lab 92/11/94 0910 06/12/12 0522 06/11/12 1455 06/11/12 0610 06/10/12 1345 06/10/12 0345 06/09/12 0325  NA 141 140 -- 139 137 134* --  K 2.3* 2.4* -- 2.6* 2.4* 2.9* --  CL 99 96 -- 95* 97 98 --  CO2 30 36* -- 30 27 23  --  GLUCOSE 114* 82 -- 129* 162* 132* --  BUN 50* 60* -- 60* 61* 62* --  CREATININE 6.24* 8.27* -- 9.55* 9.69* 10.15* --  CALCIUM 7.0* 6.8* -- 6.9* 7.0* 7.4* --  MG 1.6 1.5 -- 1.8 1.9 1.9 --  PHOS 1.0* -- 2.0* 2.0* -- -- 2.5   Liver Function Tests:  Lab 06/13/12 0910 06/11/12 0610 06/09/12 0325 06/07/12 1210  AST -- -- -- 11  ALT -- -- -- 5  ALKPHOS -- -- -- 166*  BILITOT -- -- -- 0.3  PROT -- -- -- 6.3  ALBUMIN 2.2* 2.0* 1.8* 2.6*    Lab 06/07/12 1210  LIPASE 39  AMYLASE --   No results found for this basename: AMMONIA:5 in the last 168 hours CBC:  Lab 06/11/12 0610 06/10/12 0345 06/09/12 0325 06/07/12 1210  WBC 9.4 9.1 7.6 8.8  NEUTROABS -- -- -- 6.4  HGB 9.8* 9.5* 9.8* 13.6  HCT 28.6* 26.8* 27.9* 41.1  MCV 90.8 89.0 90.9 96.7  PLT 145* 135* 120* 137*   Cardiac Enzymes: No results found for this basename: CKTOTAL:5,CKMB:5,CKMBINDEX:5,TROPONINI:5 in the last 168 hours BNP (last 3 results) No results found for this basename: PROBNP:3 in the last 8760 hours CBG: No results found for this basename: GLUCAP:5 in the last 168 hours  Recent Results (from the past 240 hour(s))  URINE CULTURE     Status: Normal   Collection Time   06/07/12 12:25 PM      Component Value Range Status Comment   Specimen Description URINE, CLEAN CATCH   Final    Special Requests NONE   Final    Culture  Setup Time 06/07/2012 22:26   Final    Colony Count 50,000 COLONIES/ML   Final    Culture     Final    Value: Multiple bacterial morphotypes present, none predominant. Suggest appropriate recollection if  clinically indicated.   Report Status 06/08/2012 FINAL   Final   MRSA PCR SCREENING     Status: Normal   Collection Time   06/07/12  3:28 PM      Component Value Range Status Comment   MRSA by PCR NEGATIVE  NEGATIVE Final   CULTURE, BLOOD (ROUTINE X 2)     Status: Normal (Preliminary result)   Collection Time   06/07/12 10:35 PM      Component Value Range Status Comment   Specimen Description BLOOD LEFT ANTECUBITAL   Final    Special Requests BOTTLES DRAWN AEROBIC AND ANAEROBIC 3CC  Final    Culture  Setup Time 06/08/2012 07:53   Final    Culture     Final    Value:        BLOOD CULTURE RECEIVED NO GROWTH TO DATE CULTURE WILL BE HELD FOR 5 DAYS BEFORE ISSUING A FINAL NEGATIVE REPORT   Report Status PENDING   Incomplete   CLOSTRIDIUM DIFFICILE BY PCR     Status: Normal   Collection Time   06/07/12 10:37 PM      Component Value Range Status Comment   C difficile by pcr NEGATIVE  NEGATIVE Final   STOOL CULTURE     Status: Normal (Preliminary result)   Collection Time   06/07/12 10:38 PM      Component Value Range Status Comment   Specimen Description STOOL   Final    Special Requests NONE   Final    Culture     Final    Value: SALMONELLA SPECIES     Note: CRITICAL RESULT CALLED TO, READ BACK BY AND VERIFIED WITH: ALLISON CHARGE RN @ 915AM BY VINCJ 06/11/12   Report Status PENDING   Incomplete    Organism ID, Bacteria SALMONELLA SPECIES   Final   CULTURE, BLOOD (ROUTINE X 2)     Status: Normal (Preliminary result)   Collection Time   06/07/12 10:45 PM      Component Value Range Status Comment   Specimen Description BLOOD RIGHT HAND   Final    Special Requests BOTTLES DRAWN AEROBIC ONLY 1.5CC   Final    Culture  Setup Time 06/08/2012 07:54   Final    Culture     Final    Value:        BLOOD CULTURE RECEIVED NO GROWTH TO DATE CULTURE WILL BE HELD FOR 5 DAYS BEFORE ISSUING A FINAL NEGATIVE REPORT   Report Status PENDING   Incomplete      Studies: No results found.  Scheduled  Meds:    . cholestyramine light  4 g Oral BID  . ciprofloxacin  500 mg Oral Q breakfast  . enoxaparin (LOVENOX) injection  30 mg Subcutaneous Q24H  . feeding supplement  1 Container Oral BID BM  . folic acid  1 mg Oral Daily  . levothyroxine  125 mcg Oral QAC breakfast  . loperamide  2 mg Oral TID  . magnesium sulfate 1 - 4 g bolus IVPB  2 g Intravenous Once  . pantoprazole  40 mg Oral BID  . phosphorus  250 mg Oral BID  . potassium phosphate IVPB (mEq)  40 mEq Intravenous Once  . predniSONE  5 mg Oral Daily  . saccharomyces boulardii  250 mg Oral BID  . sodium chloride  3 mL Intravenous Q12H  . vitamin B-12  1,000 mcg Oral Daily   Continuous Infusions:    . sodium chloride 0.45 % with kcl      Principal Problem:  *Acute on chronic kidney disease, stage 4 Active Problems:  Acute renal failure  Dehydration  Crohn's disease  Metabolic acidosis  Hypokalemia  Diarrhea  UTI (lower urinary tract infection)  Anemia  Hypomagnesemia  CKD (chronic kidney disease)  Hypocalcemia  Hypothyroidism  Salmonella enteritidis      Finley Dinkel  [facility] Pager [phone]. If 8PM-8AM, please contact night-coverage at [email or web address], password [facility] 06/13/2012, 10:50 AM  LOS: 6 days

## 2012-06-14 DIAGNOSIS — N189 Chronic kidney disease, unspecified: Secondary | ICD-10-CM

## 2012-06-14 LAB — CULTURE, BLOOD (ROUTINE X 2): Culture: NO GROWTH

## 2012-06-14 LAB — BASIC METABOLIC PANEL
BUN: 38 mg/dL — ABNORMAL HIGH (ref 6–23)
Calcium: 6.9 mg/dL — ABNORMAL LOW (ref 8.4–10.5)
Creatinine, Ser: 4.78 mg/dL — ABNORMAL HIGH (ref 0.50–1.10)
GFR calc Af Amer: 10 mL/min — ABNORMAL LOW (ref >90)
GFR calc non Af Amer: 9 mL/min — ABNORMAL LOW (ref >90)
Glucose, Bld: 74 mg/dL (ref 70–99)
Sodium: 143 mEq/L (ref 135–145)

## 2012-06-14 LAB — PHOSPHORUS: Phosphorus: 2.4 mg/dL (ref 2.3–4.6)

## 2012-06-14 LAB — MAGNESIUM: Magnesium: 1.8 mg/dL (ref 1.5–2.5)

## 2012-06-14 MED ORDER — SODIUM CHLORIDE 0.9 % IV SOLN
INTRAVENOUS | Status: DC
  Administered 2012-06-14 – 2012-06-15 (×2): via INTRAVENOUS

## 2012-06-14 NOTE — Progress Notes (Signed)
[facility] for Infectious [facility]  Day # 8 antibiotics Day # 4 ciprofloxacin (this course)   Subjective: No new complaints   Antibiotics:  Anti-infectives     Start     Dose/Rate Route Frequency Ordered Stop   06/11/12 1830   ciprofloxacin (CIPRO) tablet 500 mg  Status:  Discontinued        500 mg Oral Daily with breakfast 06/11/12 1743 06/14/12 1506   06/07/12 2200   oseltamivir (TAMIFLU) capsule 75 mg  Status:  Discontinued        75 mg Oral 2 times daily 06/07/12 1738 06/07/12 1808   06/07/12 1830   oseltamivir (TAMIFLU) capsule 75 mg  Status:  Discontinued        75 mg Oral Daily 06/07/12 1809 06/08/12 1003   06/07/12 1800   cefTRIAXone (ROCEPHIN) 1 g in dextrose 5 % 50 mL IVPB  Status:  Discontinued        1 g 100 mL/hr over 30 Minutes Intravenous Every 24 hours 06/07/12 1723 06/10/12 0811          Medications: Scheduled Meds:   . cholestyramine light  4 g Oral BID  . enoxaparin (LOVENOX) injection  30 mg Subcutaneous Q24H  . feeding supplement  1 Container Oral BID BM  . folic acid  1 mg Oral Daily  . levothyroxine  125 mcg Oral QAC breakfast  . loperamide  2 mg Oral TID  . pantoprazole  40 mg Oral BID  . phosphorus  250 mg Oral BID  . predniSONE  5 mg Oral Daily  . saccharomyces boulardii  250 mg Oral BID  . sodium chloride  3 mL Intravenous Q12H  . vitamin B-12  1,000 mcg Oral Daily   Continuous Infusions:   . sodium chloride     PRN Meds:.acetaminophen, morphine injection, ondansetron (ZOFRAN) IV, ondansetron, promethazine   Objective: Weight change: 4 lb 4.8 oz (1.95 kg)  Intake/Output Summary (Last 24 hours) at 06/14/12 1507 Last data filed at 06/14/12 0500  Gross per 24 hour  Intake    500 ml  Output   1650 ml  Net  -1150 ml   Blood pressure 99/59, pulse 100, temperature 97.3 F (36.3 C), temperature source Oral, resp. rate 18, height 5' 5"  (1.651 m), weight 149 lb (67.586 kg), SpO2 94.00%. Temp:  [97.3 F (36.3 C)-98.4 F (36.9  C)] 97.3 F (36.3 C) (01/20 1439) Pulse Rate:  [77-101] 100  (01/20 1439) Resp:  [18] 18  (01/20 1439) BP: (96-116)/(56-67) 99/59 mmHg (01/20 1439) SpO2:  [93 %-98 %] 94 % (01/20 1439) Weight:  [149 lb (67.586 kg)] 149 lb (67.586 kg) (01/19 2049)  Physical Exam: General: Alert and awake, oriented x3, not in any acute distress. HEENT: anicteric sclera, pupils reactive to light and accommodation, EOMI CVS regular rate, normal r,  no murmur rubs or gallops Abdomen: soft nontender, nondistended, normal bowel sounds, Extremities: no  clubbing or edema noted bilaterally Skin: no rashes Neuro: nonfocal  Lab Results: No results found for this basename: WBC:2,HGB:2,HCT:2,PLT:2 in the last 72 hours  BMET  [facility] 06/14/12 0802 06/13/12 0910  NA 143 141  K 3.6 2.3*  CL 105 99  CO2 25 30  GLUCOSE 74 114*  BUN 38* 50*  CREATININE 4.78* 6.24*  CALCIUM 6.9* 7.0*    Micro Results: Recent Results (from the past 240 hour(s))  URINE CULTURE     Status: Normal   Collection Time   06/07/12 12:25 PM  Component Value Range Status Comment   Specimen Description URINE, CLEAN CATCH   Final    Special Requests NONE   Final    Culture  Setup Time 06/07/2012 22:26   Final    Colony Count 50,000 COLONIES/ML   Final    Culture     Final    Value: Multiple bacterial morphotypes present, none predominant. Suggest appropriate recollection if clinically indicated.   Report Status 06/08/2012 FINAL   Final   MRSA PCR SCREENING     Status: Normal   Collection Time   06/07/12  3:28 PM      Component Value Range Status Comment   MRSA by PCR NEGATIVE  NEGATIVE Final   CULTURE, BLOOD (ROUTINE X 2)     Status: Normal   Collection Time   06/07/12 10:35 PM      Component Value Range Status Comment   Specimen Description BLOOD LEFT ANTECUBITAL   Final    Special Requests BOTTLES DRAWN AEROBIC AND ANAEROBIC 3CC   Final    Culture  Setup Time 06/08/2012 07:53   Final    Culture NO GROWTH 5 DAYS   Final     Report Status 06/14/2012 FINAL   Final   CLOSTRIDIUM DIFFICILE BY PCR     Status: Normal   Collection Time   06/07/12 10:37 PM      Component Value Range Status Comment   C difficile by pcr NEGATIVE  NEGATIVE Final   STOOL CULTURE     Status: Normal (Preliminary result)   Collection Time   06/07/12 10:38 PM      Component Value Range Status Comment   Specimen Description STOOL   Final    Special Requests NONE   Final    Culture     Final    Value: SALMONELLA SPECIES     Note: CRITICAL RESULT CALLED TO, READ BACK BY AND VERIFIED WITH: ALLISON CHARGE RN @ 915AM BY VINCJ 06/11/12   Report Status PENDING   Incomplete    Organism ID, Bacteria SALMONELLA SPECIES   Final   CULTURE, BLOOD (ROUTINE X 2)     Status: Normal   Collection Time   06/07/12 10:45 PM      Component Value Range Status Comment   Specimen Description BLOOD RIGHT HAND   Final    Special Requests BOTTLES DRAWN AEROBIC ONLY 1.5CC   Final    Culture  Setup Time 06/08/2012 07:54   Final    Culture NO GROWTH 5 DAYS   Final    Report Status 06/14/2012 FINAL   Final     Studies/Results: No results found.    Assessment/Plan: EDEE NIFONG is a 67 y.o. female with  Salmonella enteritis sp 8 days of effective therapy  #1 Salmonella enteritis: pt has received 8 days of effective antibiotics. Species has not yet been identified as S. Typhi or non  Typhi buth length of therapy is the same. Pt is sp cholecystectomy so would not likely be a chronic carrier absent a gallbladder.  I will DC her ciprofloxacin   #2 Screening: I will order an HIV ab and followup on this.  Otherwise I will sign off for now please call with further questions.   LOS: 7 days   Alcide Evener 06/14/2012, 3:07 PM

## 2012-06-14 NOTE — Progress Notes (Signed)
[facility] Progress Note    Subjective:   Feeling Better.  No BM overnight.  Good UOP   Objective:   BP 116/60  Pulse 101  Temp 98.4 F (36.9 C) (Oral)  Resp 18  Ht 5' 5"  (1.651 m)  Wt 149 lb (67.586 kg)  BMI 24.79 kg/m2  SpO2 98%  Intake/Output Summary (Last 24 hours) at 06/14/12 1046 Last data filed at 06/14/12 0500  Gross per 24 hour  Intake   1780 ml  Output   2200 ml  Net   -420 ml   Weight change: 4 lb 4.8 oz (1.95 kg)  Physical Exam: Gen: alert, older adult female, no distress, sitting comfortably in bedside chair Skin: no rash, cyanosis  HEENT: EOMI, sclera anicteric,  Neck: no JVD, no LAN  Chest: clear bilat, no rales or rhonchi  CV: regular, no rub or gallop, no murmur, pedal pulses intact  Abdomen: soft, nontender, no ascites or HSM  Ext: 2+/4 LE pitting edema Neuro: alert, Ox3, no focal deficit, no asterixis   UA- modLE, large bld, 11-20 wbc, 3-6 rbc, many bact/epis, +gran casts  CT abd (oral contrast only) > no hydro   Imaging: No results found.  Labs: BMET  Lab 06/14/12 0802 06/13/12 0910 06/12/12 0522 06/11/12 1455 06/11/12 0610 06/10/12 1345 06/10/12 0345 06/09/12 1744 06/09/12 0325  NA 143 141 140 -- 139 137 134* 132* --  K 3.6 2.3* 2.4* -- 2.6* 2.4* 2.9* 2.4* --  CL 105 99 96 -- 95* 97 98 95* --  CO2 25 30 36* -- 30 27 23 21  --  GLUCOSE 74 114* 82 -- 129* 162* 132* 132* --  BUN 38* 50* 60* -- 60* 61* 62* 62* --  CREATININE 4.78* 6.24* 8.27* -- 9.55* 9.69* 10.15* 9.84* --  ALB -- -- -- -- -- -- -- -- --  CALCIUM 6.9* 7.0* 6.8* -- 6.9* 7.0* 7.4* 6.1* --  PHOS 2.4 1.0* -- 2.0* 2.0* -- -- -- 2.5   CBC  Lab 06/11/12 0610 06/10/12 0345 06/09/12 0325 06/07/12 1210  WBC 9.4 9.1 7.6 8.8  NEUTROABS -- -- -- 6.4  HGB 9.8* 9.5* 9.8* 13.6  HCT 28.6* 26.8* 27.9* 41.1  MCV 90.8 89.0 90.9 96.7  PLT 145* 135* 120* 137*    06/09/2012 08:35  Iron 129  UIBC 22 (L)  TIBC 151 (L)  Saturation Ratios 85 (H)  Ferritin 331 (H)    Folate 9.6    06/08/2012 10:58  Sodium, Ur 83  Creatinine, Urine 190.3    Medications:      . cholestyramine light  4 g Oral BID  . ciprofloxacin  500 mg Oral Q breakfast  . enoxaparin (LOVENOX) injection  30 mg Subcutaneous Q24H  . feeding supplement  1 Container Oral BID BM  . folic acid  1 mg Oral Daily  . levothyroxine  125 mcg Oral QAC breakfast  . loperamide  2 mg Oral TID  . pantoprazole  40 mg Oral BID  . phosphorus  250 mg Oral BID  . predniSONE  5 mg Oral Daily  . saccharomyces boulardii  250 mg Oral BID  . sodium chloride  3 mL Intravenous Q12H  . vitamin B-12  1,000 mcg Oral Daily     Assessment/ Plan:   1. Acute on CRF- due to severe volume depletion and prob ATN; resolving/improving, UOP great now, creatinine coming down. Recommend decreasing IVF's to [facility] and encourage PO intake given improvement in her diarrhea. 2. Salmonella gastroenteritis-  on cipro per ID  3. CKD IV, baseline creat 2-3, Followed by Dr. Mercy Moore as OP 4. Crohn's disease - on low dose prednisone 39m daily 5. Hypokalemia due to diarrhea- RESOLVED replace per primary 6. Hypomagnesemia due to diarrhea- s/p 4gm IV Mg 1/15 and 2 gm 1/18 7. Hypocalcemia due to advanced RF- s/p 4 gm IV Ca on 1/15 and 1 gm on 1/18  MGerda Diss DO MZacarias PontesFamily Medicine Resident - PGY-2 06/14/2012 10:48 AM  I have seen and examined this patient and agree with plan per Dr RPaulla Fore  Renal fx improving and diarrhea improving.  Will DC IV fluids.  Re check labs in AM.. Deontae Robson T,MD 06/14/2012 1:27 PM

## 2012-06-14 NOTE — Progress Notes (Signed)
[facility] PROGRESS NOTE  Natasha [name] FXO:[id number] DOB: 03-31-1946 DOA: 06/07/2012 PCP: Garlan Fair, MD  Assessment/Plan:  Nausea/ vomiting/Diarrhea/ most likely not Crohn flare - most likely though due to Salmonella enteritis  -. Patient states was taken off asacol PTA. -C. difficile negative  -She is on chronic steroids for her Crohn's and we did prescribe Hydrocortisone 50 mg iv every 8 hours after admission. Patient changed back to prednisone 5 mg daily on 06/12/12 after it became clear that her current episode is due to infection and not a Crohn's flare.  -CT scan of abdomen and pelvis with oral contrast only-shows mild circumferential wall thickening suggesting inflammatory bowel disease. Negative for abscess or perforation.   - [facility] consulted on 1/16.  - Micro lab called on 06/11/12 and reported that the patient is growing Salmonella in the stool culture  - ID consultant recommended Cipro for 5 days on 1/17.   Hypotension  -Likely secondary to hypovolemia due to #1, she is also on chronic prednisone so covering with stress dose steroids as above -Continue Hydration with IV fluids  -Her urinalysis was consistent with a UTI. Urine cultures with 50,000 morphotypes. Patient has had 3 days of IV Rocephin.  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  - Dehydration, hypotension, UTI, likely contributing factors, possibly ATN  - [facility] Dr. Jonnie Finner consulted, and due to CT scan being negative for hydronephrosis no renal ultrasound is needed at this time. Patient was treated with IV fluids with sodium bicarbonate   Dehydration  -Hydration with IV fluids follow and recheck.   Probable urinary tract infection  -Urine cultures with 50,000 colonies  of multiple bacterial morphotypes. Patient was started on Rocephin on 06/07/2012. Patient has had 3 days of IV antibiotics. .   Metabolic acidosis  -Secondary to acute on chronic renal failure, placed on bicarbonate. Acidosis resolved with iv bicarbonate on 06/11/12  Severe Hypokalemia  -Secondary to GI losses.monitor and replete iv and po   Severe hypomagnesemia Likely secondary to GI losses. Monitor and Replete iv   Hypocalcemia Patient was given 4 g of IV calcium gluconate on 06/09/12. Patient was given again Calcium gluconate at 1 g iv on 06/12/12.   Anemia of chronic disease Patient with no overt GI bleed. Likely dilutional in nature and secondary to chronic kidney disease. Anemia panel consistent with anemia of chronic disease. H&H is stable. Follow.  Hypothyroidism TSH of 0.983. Continue current dose of Synthroid.  Flulike symptoms  -influenza panel negative,     Hypophosphatemia  Replete and monitor   Code Status: Full Family Communication: patient  Disposition Plan: home   Consultants:  Renal: Dr. Jonnie Finner 06/08/2012  [facility]   Procedures:  CT abdomen and pelvis 06/07/2012  Chest x-ray 06/03/2012  Antibiotics:  IV Rocephin 06/07/2012--->06/10/12  HPI/Subjective: No further diarrhea  Objective: Filed Vitals:   06/13/12 1738 06/13/12 2049 06/14/12 0518 06/14/12 1018  BP:  108/67 96/56 116/60  Pulse:  90 77 101  Temp: 98 F (36.7 C) 98.3 F (36.8 C) 98.1 F (36.7 C) 98.4 F (36.9 C)  TempSrc: Oral Oral Oral Oral  Resp:  18 18 18   Height:      Weight:  67.586 kg (  149 lb)    SpO2:  94% 93% 98%    Intake/Output Summary (Last 24 hours) at 06/14/12 1358 Last data filed at 06/14/12 0500  Gross per 24 hour  Intake    500 ml  Output   1650 ml  Net  -1150 ml   Filed Weights   06/11/12 2138 06/12/12 2300 06/13/12 2049  Weight: 67.178 kg (148 lb 1.6 oz) 65.635 kg (144 lb 11.2 oz) 67.586 kg (149 lb)    Exam:   General:   NAD  Cardiovascular: RRR no m/r/g. No JVD, No LE edema  Respiratory: CTAB  Abdomen: Soft/NT/ND/+BS  Data Reviewed: Basic Metabolic Panel:  Lab 68/03/21 0802 06/13/12 0910 06/12/12 0522 06/11/12 1455 06/11/12 0610 06/10/12 1345 06/09/12 0325  NA 143 141 140 -- 139 137 --  K 3.6 2.3* 2.4* -- 2.6* 2.4* --  CL 105 99 96 -- 95* 97 --  CO2 25 30 36* -- 30 27 --  GLUCOSE 74 114* 82 -- 129* 162* --  BUN 38* 50* 60* -- 60* 61* --  CREATININE 4.78* 6.24* 8.27* -- 9.55* 9.69* --  CALCIUM 6.9* 7.0* 6.8* -- 6.9* 7.0* --  MG 1.8 1.6 1.5 -- 1.8 1.9 --  PHOS 2.4 1.0* -- 2.0* 2.0* -- 2.5   Liver Function Tests:  Lab 06/13/12 0910 06/11/12 0610 06/09/12 0325  AST -- -- --  ALT -- -- --  ALKPHOS -- -- --  BILITOT -- -- --  PROT -- -- --  ALBUMIN 2.2* 2.0* 1.8*   No results found for this basename: LIPASE:5,AMYLASE:5 in the last 168 hours No results found for this basename: AMMONIA:5 in the last 168 hours CBC:  Lab 06/11/12 0610 06/10/12 0345 06/09/12 0325  WBC 9.4 9.1 7.6  NEUTROABS -- -- --  HGB 9.8* 9.5* 9.8*  HCT 28.6* 26.8* 27.9*  MCV 90.8 89.0 90.9  PLT 145* 135* 120*   Cardiac Enzymes: No results found for this basename: CKTOTAL:5,CKMB:5,CKMBINDEX:5,TROPONINI:5 in the last 168 hours BNP (last 3 results) No results found for this basename: PROBNP:3 in the last 8760 hours CBG: No results found for this basename: GLUCAP:5 in the last 168 hours  Recent Results (from the past 240 hour(s))  URINE CULTURE     Status: Normal   Collection Time   06/07/12 12:25 PM      Component Value Range Status Comment   Specimen Description URINE, CLEAN CATCH   Final    Special Requests NONE   Final    Culture  Setup Time 06/07/2012 22:26   Final    Colony Count 50,000 COLONIES/ML   Final    Culture     Final    Value: Multiple bacterial morphotypes present, none predominant. Suggest appropriate recollection if clinically indicated.   Report Status 06/08/2012 FINAL   Final   MRSA PCR  SCREENING     Status: Normal   Collection Time   06/07/12  3:28 PM      Component Value Range Status Comment   MRSA by PCR NEGATIVE  NEGATIVE Final   CULTURE, BLOOD (ROUTINE X 2)     Status: Normal   Collection Time   06/07/12 10:35 PM      Component Value Range Status Comment   Specimen Description BLOOD LEFT ANTECUBITAL   Final    Special Requests BOTTLES DRAWN AEROBIC AND ANAEROBIC 3CC   Final    Culture  Setup Time 06/08/2012 07:53   Final    Culture NO GROWTH 5 DAYS  Final    Report Status 06/14/2012 FINAL   Final   CLOSTRIDIUM DIFFICILE BY PCR     Status: Normal   Collection Time   06/07/12 10:37 PM      Component Value Range Status Comment   C difficile by pcr NEGATIVE  NEGATIVE Final   STOOL CULTURE     Status: Normal (Preliminary result)   Collection Time   06/07/12 10:38 PM      Component Value Range Status Comment   Specimen Description STOOL   Final    Special Requests NONE   Final    Culture     Final    Value: SALMONELLA SPECIES     Note: CRITICAL RESULT CALLED TO, READ BACK BY AND VERIFIED WITH: ALLISON CHARGE RN @ 915AM BY VINCJ 06/11/12   Report Status PENDING   Incomplete    Organism ID, Bacteria SALMONELLA SPECIES   Final   CULTURE, BLOOD (ROUTINE X 2)     Status: Normal   Collection Time   06/07/12 10:45 PM      Component Value Range Status Comment   Specimen Description BLOOD RIGHT HAND   Final    Special Requests BOTTLES DRAWN AEROBIC ONLY 1.5CC   Final    Culture  Setup Time 06/08/2012 07:54   Final    Culture NO GROWTH 5 DAYS   Final    Report Status 06/14/2012 FINAL   Final      Studies: No results found.  Scheduled Meds:    . cholestyramine light  4 g Oral BID  . ciprofloxacin  500 mg Oral Q breakfast  . enoxaparin (LOVENOX) injection  30 mg Subcutaneous Q24H  . feeding supplement  1 Container Oral BID BM  . folic acid  1 mg Oral Daily  . levothyroxine  125 mcg Oral QAC breakfast  . loperamide  2 mg Oral TID  . pantoprazole  40 mg Oral BID   . phosphorus  250 mg Oral BID  . predniSONE  5 mg Oral Daily  . saccharomyces boulardii  250 mg Oral BID  . sodium chloride  3 mL Intravenous Q12H  . vitamin B-12  1,000 mcg Oral Daily   Continuous Infusions:    . sodium chloride      Principal Problem:  *Acute on chronic kidney disease, stage 4 Active Problems:  Acute renal failure  Dehydration  Crohn's disease  Metabolic acidosis  Hypokalemia  Diarrhea  UTI (lower urinary tract infection)  Anemia  Hypomagnesemia  CKD (chronic kidney disease)  Hypocalcemia  Hypothyroidism  Salmonella enteritidis      Caitlinn Klinker  [facility] Pager [phone]. If 8PM-8AM, please contact night-coverage at [email or web address], password [facility] 06/14/2012, 1:58 PM  LOS: 7 days

## 2012-06-14 NOTE — Progress Notes (Signed)
Physical Therapy Treatment Patient Details Name: Natasha [name] MRN: [id number] DOB: 01/14/1946 Today's Date: 06/14/2012 Time: 3790-2409 PT Time Calculation (min): 27 min  PT Assessment / Plan / Recommendation Comments on Treatment Session  Continuing progress with amb, and RW use, though still with somewhat decr activity tol; Will consider Berg Balance Assessment for next session    Follow Up Recommendations  Home health PT;Supervision/Assistance - 24 hour     Does the patient have the potential to tolerate intense rehabilitation     Barriers to Discharge        Equipment Recommendations  Rolling walker with 5" wheels (3in1)    Recommendations for Other Services    Frequency Min 3X/week   Plan Discharge plan remains appropriate    Precautions / Restrictions Precautions Precautions: Fall   Pertinent Vitals/Pain HR 102, sinus tachy with amb    Mobility  Bed Mobility Bed Mobility: Supine to Sit;Sitting - Scoot to Edge of Bed Supine to Sit: 6: Modified independent (Device/Increase time);HOB elevated;With rails Sitting - Scoot to Edge of Bed: 7: Independent Transfers Transfers: Sit to Stand;Stand to Sit Sit to Stand: 4: Min guard;5: Supervision;From bed;From toilet;With upper extremity assist;Other (comment) (without physical contact) Stand to Sit: 4: Min guard;To toilet;To chair/3-in-1;With upper extremity assist Details for Transfer Assistance: occasional reminders for hand placement needed Ambulation/Gait Ambulation/Gait Assistance: 4: Min guard (without physical contact) Ambulation Distance (Feet): 120 Feet Assistive device: Rolling walker Ambulation/Gait Assistance Details: cues for posture and positioning inside RW as well as to self-monitor for activity tolerance; Pt with mild dizziness during walk , which did not worsen with incr time in upright standing; Very slow gait today, cues to look straight ahead as opposed to looking down at floor Gait Pattern:  Step-through pattern Gait velocity: slowed    Exercises     PT Diagnosis:    PT Problem List:   PT Treatment Interventions:     PT Goals Acute Rehab PT Goals Time For Goal Achievement: 06/18/12 Potential to Achieve Goals: Good Pt will go Supine/Side to Sit: with modified independence PT Goal: Supine/Side to Sit - Progress: Met Pt will go Sit to Stand: with modified independence PT Goal: Sit to Stand - Progress: Progressing toward goal Pt will go Stand to Sit: with modified independence PT Goal: Stand to Sit - Progress: Progressing toward goal Pt will Ambulate: >150 feet;with modified independence;with least restrictive assistive device PT Goal: Ambulate - Progress: Progressing toward goal  Visit Information  Last PT Received On: 06/14/12 Assistance Needed: +1    Subjective Data  Subjective: "feeling a little better each day" Patient Stated Goal: home and taking care of self   Cognition  Overall Cognitive Status: Appears within functional limits for tasks assessed/performed Arousal/Alertness: Awake/alert Orientation Level: Oriented X4 / Intact Behavior During Session: [facility] for tasks performed    Balance     End of Session PT - End of Session Equipment Utilized During Treatment: Gait belt Activity Tolerance: Patient tolerated treatment well Patient left: in chair;with call bell/phone within reach Nurse Communication: Mobility status   GP     Quin Hoop Rogers, [place]  06/14/2012, 12:44 PM

## 2012-06-15 LAB — MAGNESIUM: Magnesium: 1.4 mg/dL — ABNORMAL LOW (ref 1.5–2.5)

## 2012-06-15 LAB — BASIC METABOLIC PANEL
BUN: 31 mg/dL — ABNORMAL HIGH (ref 6–23)
CO2: 25 mEq/L (ref 19–32)
Calcium: 6.6 mg/dL — ABNORMAL LOW (ref 8.4–10.5)
Creatinine, Ser: 3.68 mg/dL — ABNORMAL HIGH (ref 0.50–1.10)
GFR calc non Af Amer: 12 mL/min — ABNORMAL LOW (ref >90)
Glucose, Bld: 52 mg/dL — ABNORMAL LOW (ref 70–99)
Sodium: 144 mEq/L (ref 135–145)

## 2012-06-15 LAB — HIV ANTIBODY (ROUTINE TESTING W REFLEX): HIV: NONREACTIVE

## 2012-06-15 MED ORDER — SACCHAROMYCES BOULARDII 250 MG PO CAPS: 250.0000 mg | ORAL_CAPSULE | Freq: Two times a day (BID) | ORAL | Status: DC

## 2012-06-15 MED ORDER — SODIUM CHLORIDE 0.9 % IV SOLN
1.0000 g | Freq: Once | INTRAVENOUS | Status: AC
  Administered 2012-06-15: 1 g via INTRAVENOUS
  Filled 2012-06-15 (×2): qty 10

## 2012-06-15 MED ORDER — MAGNESIUM SULFATE 40 MG/ML IJ SOLN
4.0000 g | Freq: Once | INTRAMUSCULAR | Status: AC
  Administered 2012-06-15: 4 g via INTRAVENOUS
  Filled 2012-06-15 (×2): qty 100

## 2012-06-15 MED ORDER — MAGNESIUM CHLORIDE ER 535 (64 MG) MG PO TBCR: 1.0000 | EXTENDED_RELEASE_TABLET | Freq: Every day | ORAL | Status: DC

## 2012-06-15 MED ORDER — POTASSIUM CHLORIDE CRYS ER 20 MEQ PO TBCR
40.0000 meq | EXTENDED_RELEASE_TABLET | Freq: Two times a day (BID) | ORAL | Status: DC
  Administered 2012-06-15: 40 meq via ORAL

## 2012-06-15 MED ORDER — POTASSIUM CHLORIDE ER 10 MEQ PO TBCR: 20.0000 meq | EXTENDED_RELEASE_TABLET | Freq: Every day | ORAL | Status: DC

## 2012-06-15 NOTE — Progress Notes (Signed)
Occupational Therapy Treatment Patient Details Name: Natasha [name] MRN: [id number] DOB: [birth year] Today's Date: 06/15/2012 Time: 5361-4431 OT Time Calculation (min): 26 min  OT Assessment / Plan / Recommendation Comments on Treatment Session Pt making good progress.  Demonstrating ADLs and functional transfers at supervision level today.  Plan to address tub transfer next session.     Follow Up Recommendations  Home health OT    Barriers to Discharge       Equipment Recommendations  None recommended by OT    Recommendations for Other Services    Frequency Min 2X/week   Plan Discharge plan remains appropriate    Precautions / Restrictions     Pertinent Vitals/Pain See vitals    ADL  Eating/Feeding: Performed;Independent Where Assessed - Eating/Feeding: Edge of bed Grooming: Performed;Wash/dry hands;Wash/dry face;Supervision/safety Where Assessed - Grooming: Unsupported standing Upper Body Bathing: Simulated;Set up Where Assessed - Upper Body Bathing: Unsupported sitting Lower Body Bathing: Simulated;Set up Where Assessed - Lower Body Bathing: Unsupported sitting Upper Body Dressing: Simulated;Set up Where Assessed - Upper Body Dressing: Unsupported sitting Lower Body Dressing: Simulated;Set up Where Assessed - Lower Body Dressing: Unsupported sitting (donned bil socks) Toilet Transfer: Simulated;Supervision/safety Toilet Transfer Method: Sit to Loss adjuster, chartered:  (bed ambulating to chair) Equipment Used: Rolling walker Transfers/Ambulation Related to ADLs: supervision with RW ambulating ~13 ft from bed to sink and then to chair ADL Comments: Pt able to don bil socks sitting EOB.  Pt reports she typically performs LB bathing/dressing tasks standing.  Educated pt on benefits of performing LB bathing/dressing tasks while sitting (to thread LEs through clothing) in order to conserve energy and minimize fall risk.  Pt concerned about bil LE swelling, and  educated pt on perfroming ankle pumps and elevating LEs when in recliner to assist with edema management.      OT Diagnosis:    OT Problem List:   OT Treatment Interventions:     OT Goals ADL Goals Pt Will Perform Grooming: with modified independence;Standing at sink;Unsupported ADL Goal: Grooming - Progress: Progressing toward goals Pt Will Perform Upper Body Bathing: with modified independence;Standing at sink ADL Goal: Upper Body Bathing - Progress: Progressing toward goals Pt Will Perform Lower Body Bathing: with modified independence;Standing at sink ADL Goal: Lower Body Bathing - Progress: Progressing toward goals Pt Will Perform Upper Body Dressing: with modified independence;Sit to stand from chair ADL Goal: Upper Body Dressing - Progress: Progressing toward goals Pt Will Perform Lower Body Dressing: with modified independence;Sit to stand from chair ADL Goal: Lower Body Dressing - Progress: Progressing toward goals Pt Will Transfer to Toilet: with modified independence;with DME ADL Goal: Toilet Transfer - Progress: Progressing toward goals Pt Will Perform Tub/Shower Transfer: Tub transfer;with supervision;Ambulation;with DME ADL Goal: Tub/Shower Transfer - Progress: Goal set today  Visit Information  Last OT Received On: 06/15/12 Assistance Needed: +1    Subjective Data      Prior Functioning       Cognition  Overall Cognitive Status: Appears within functional limits for tasks assessed/performed Arousal/Alertness: Awake/alert Orientation Level: Oriented X4 / Intact Behavior During Session: [facility] for tasks performed    Mobility  Shoulder Instructions Bed Mobility Bed Mobility: Not assessed Details for Bed Mobility Assistance: pt sitting EOB Transfers Transfers: Sit to Stand;Stand to Sit Sit to Stand: 5: Supervision;From bed;With upper extremity assist Stand to Sit: 5: Supervision;To chair/3-in-1;With armrests;With upper extremity assist Details for Transfer  Assistance: VC for hand placement for sit<>stand but independently demontrated safe technique for stand <>  sit.       Exercises      Balance     End of Session OT - End of Session Equipment Utilized During Treatment:  (RW) Activity Tolerance: Patient tolerated treatment well Patient left: in chair;with call bell/phone within reach  GO   06/15/2012 Darrol Jump OTR/L Pager [phone] Office [phone]   Darrol Jump 06/15/2012, 9:09 AM

## 2012-06-15 NOTE — Progress Notes (Signed)
Pt received copy of discharge instructions, pt verbalizes understanding of instructions and signs/symptoms to follow up with MD, telemetry D/C'd, pt still receiving Mg Sulfate replacement, IV will be D/C'd after completing, pt also awaiting husband to be transported home

## 2012-06-15 NOTE — Discharge Summary (Signed)
Physician Discharge Summary  Natasha [name] WPV:[id number] DOB: 10-16-1945 DOA: 06/07/2012  PCP: Garlan Fair, MD  Admit date: 06/07/2012 Discharge date: 06/15/2012  Time spent: 45 minutes  Recommendations for Outpatient Follow-up:  1. Patient needs followup basic metabolic profile and magnesium level   Discharge Diagnoses:  Acute on chronic kidney disease, stage 4 - creatinine improved down to 3.68 prior to discharge  Dehydration - resolved  Crohn's disease  Metabolic acidosis - resolved  Hypokalemia - improved-continue outpatient repletion  Diarrhea - resolved  Anemia  Hypomagnesemia  Hypocalcemia  Hypothyroidism  Salmonella enteritidis   Discharge Condition: GOOD  Diet recommendation: REGULAR   Filed Weights   06/12/12 2300 06/13/12 2049 06/14/12 2100  Weight: 65.635 kg (144 lb 11.2 oz) 67.586 kg (149 lb) 68.4 kg (150 lb 12.7 oz)    History of present illness:  Natasha [name] is a 67 y.o. female with history of CKD-as creatinine 2.68 on 1/9, Crohn's colitis and GERD who presents with above complaints. She states that she began having nausea and vomiting 5 days ago-was seen at the: ED and started on antiemetics and discharged home. She reports that following that she developed abdominal pain which has gradually worsened, she also states she's been having diarrhea-3-4 episodes per day, nonbloody. She states she is continued to have the nausea vomiting 3-4 times a day as well nonbloody. She also admits to arm subjective fevers, generalized body aches, and that initially she had a cough but that arm has resolved. She was seen in the ED and a chest x-ray was done and showed no active cardiopulmonary disease, and labs revealed a creatinine of the 9.21 with a BUN of 60 potassium of 2.5 a CO2 of 10. The EDP consulted renal-spoke with Dr. Jonnie Finner who states patient does not need to be [facility] and recommended admitting to the hospitalist service at [facility]. Her  urinalysis was consistent with a UTI, and white cell count within normal limits at 8.8. She is admitted for further evaluation and management.   Hospital Course:  Nausea/ vomiting/Diarrhea/ most likely not Crohn flare - most likely though due to Salmonella enteritis  -. Patient states was taken off asacol PTA.  -C. difficile negative  -She is on chronic steroids for her Crohn's and we did prescribe Hydrocortisone 50 mg iv every 8 hours after admission. Patient changed back to prednisone 5 mg daily on 06/12/12 after it became clear that her current episode is due to infection and not a Crohn's flare.  -CT scan of abdomen and pelvis with oral contrast only-shows mild circumferential wall thickening suggesting inflammatory bowel disease. Negative for abscess or perforation.  - [facility] consulted on 1/16.  - Micro lab called on 06/11/12 and reported that the patient is growing Salmonella in the stool culture  - ID consultant recommended Cipro for 5 days on 1/17.  - The patient completed antibiotic treatment prior to discharge and her diarrhea resolved completely Hypotension  -Likely secondary to hypovolemia due to #1,  - The patient received Hydration with IV fluids throughout admission -Her urinalysis was consistent with a UTI. Urine cultures with 50,000 morphotypes. Patient has had 3 days of IV Rocephin.  - Hypotension resolved prior to discharge Acute on chronic renal failure(CKD 4) baseline 2-3  -Patient's last creatinine on 1/9 was 2.68, and up to 9.21on 1/13 and worsening 1/14 10.59 and on 1/17 is down to 9.5 and on 06/13/2012 is down to 6.24  - UOP increased a lot over last  24 hrs suggestive of a polyuric phase.  - Dehydration, hypotension, UTI, likely contributing factors, possibly ATN  - [facility] Dr. Jonnie Finner consulted, and due to CT scan being negative for hydronephrosis no renal ultrasound is needed at this time. Patient was treated with IV fluids with sodium bicarbonate  - Patient's creatinine  improved to a level of 3.6 prior to discharge  Probable urinary tract infection  -Urine cultures with 50,000 colonies of multiple bacterial morphotypes. Patient was started on Rocephin on 06/07/2012. Patient has had 3 days of IV antibiotics. .   Metabolic acidosis  -Secondary to acute on chronic renal failure, placed on bicarbonate. Acidosis resolved with iv bicarbonate on 06/11/12   Severe Hypokalemia  -Secondary to GI losses.monitored  and repleted iv and po multiple times during admission   Severe hypomagnesemia  Likely secondary to GI losses. Monitored  and Repleted iv multiple times during this admission Hypocalcemia  Patient was given 4 g of IV calcium gluconate on 06/09/12. Patient was given again Calcium gluconate at 1 g iv on 06/12/12.  Anemia of chronic disease  Patient with no overt GI bleed. Likely dilutional in nature and secondary to chronic kidney disease. Anemia panel consistent with anemia of chronic disease.  Hypothyroidism  TSH of 0.983. Continued on home  dose of Synthroid.  Flulike symptoms  -influenza panel negative,  Hypophosphatemia  Repleted. Normalized prior to discharge     Consultations:  Nephrology and gastroenterology  Discharge Exam: Filed Vitals:   06/14/12 1439 06/14/12 1919 06/14/12 2100 06/15/12 0512  BP: 99/59 133/76 104/61 96/63  Pulse: 100 108 102 80  Temp: 97.3 F (36.3 C) 98.3 F (36.8 C) 98.6 F (37 C) 98.1 F (36.7 C)  TempSrc: Oral Oral Oral Oral  Resp: 18 18 18 16   Height:      Weight:   68.4 kg (150 lb 12.7 oz)   SpO2: 94% 96% 98% 100%    General: Alert and oriented x3 Cardiovascular: Regular rate and rhythm Respiratory: Clear to auscultation bilaterally  Discharge Instructions  Discharge Orders    Future Orders Please Complete By Expires   Diet general      Increase activity slowly          Medication List     As of 06/15/2012  9:37 AM    STOP taking these medications         ASACOL 400 MG EC tablet   Generic  drug: mesalamine      promethazine 25 MG tablet   Commonly known as: PHENERGAN      TAKE these medications         acetaminophen 500 MG tablet   Commonly known as: TYLENOL   Take 500 mg by mouth every 6 (six) hours as needed. For severe pain      ACTONEL 35 MG tablet   Generic drug: risedronate   Take 35 mg by mouth every 7 (seven) days. with water on empty stomach, nothing by mouth or lie down for next 30 minutes. Takes on Monday      folic acid 1 MG tablet   Commonly known as: FOLVITE   Take 1 mg by mouth daily.      levothyroxine 125 MCG tablet   Commonly known as: SYNTHROID, LEVOTHROID   Take 125 mcg by mouth daily.      Magnesium Chloride 535 (64 MG) MG Tbcr   Take 1 tablet (64 mg total) by mouth daily.      omeprazole 40 MG capsule  Commonly known as: PRILOSEC   Take 40 mg by mouth daily.      ondansetron 4 MG disintegrating tablet   Commonly known as: ZOFRAN-ODT   Take 1 tablet (4 mg total) by mouth every 8 (eight) hours as needed for nausea.      potassium chloride 10 MEQ tablet   Commonly known as: K-DUR   Take 2 tablets (20 mEq total) by mouth daily.      predniSONE 5 MG tablet   Commonly known as: DELTASONE   Take 5 mg by mouth daily.      saccharomyces boulardii 250 MG capsule   Commonly known as: FLORASTOR   Take 1 capsule (250 mg total) by mouth 2 (two) times daily.      vitamin B-12 1000 MCG tablet   Commonly known as: CYANOCOBALAMIN   Take 1,000 mcg by mouth daily.      cyanocobalamin 1000 MCG/ML injection   Commonly known as: (VITAMIN B-12)   Inject 1,000 mcg into the muscle every 30 (thirty) days. Last injection was 05/07/12           Follow-up Information    Follow up with Garlan Fair, MD. Schedule an appointment as soon as possible for a visit in 2 weeks.   Contact information:   [address], [place] [id number] [phone]       Follow up with Windy Kalata, MD. On 06/17/2012. (labs visit )    Contact information:   [place] [id number] [phone]           The results of significant diagnostics from this hospitalization (including imaging, microbiology, ancillary and laboratory) are listed below for reference.    Significant Diagnostic Studies: Ct Abdomen Pelvis Wo Contrast  06/07/2012  *RADIOLOGY REPORT*  Clinical Data: Chronic renal insufficiency, Crohn's disease, nausea, vomiting.  CT ABDOMEN AND PELVIS WITHOUT CONTRAST  Technique:  Multidetector CT imaging of the abdomen and pelvis was performed following the standard protocol without intravenous contrast.  Comparison: 07/04/2011  Findings: The visualized lung bases clear.  Vascular clips in the gallbladder fossa.  Unremarkable uninfused evaluation of liver, spleen, adrenal glands, pancreas.  Bilateral nephrolithiasis, with scattered 2-3 mm stones bilaterally.  No hydronephrosis.  Stable exophytic 53m probable cyst from the lower pole left kidney.  No ureterectasis or ureteral calculus.  Urinary bladder is incompletely distended.  Patchy aortoiliac calcifications without aneurysm.  Stomach is nondistended.  Changes of partial right hemicolectomy. There are several fluid distended distal small bowel loops in the right Lateral abdomen, and an increased number of sub centimeter lymph nodes in the regional mesentery.  There is fluid distention of the sigmoid colon and rectum.  No ascites.  No free air. Mild spondylitic changes in the lower lumbar spine.  IMPRESSION:  1.  Fluid distention of distal small bowel loops just proximal to the anastomosis with the colon, with some mild circumferential wall thickening suggesting inflammatory bowel disease. Negative for abscess or perforation. 2.  Fluid distention of the sigmoid colon without without evidence of obstruction or other structural abnormality. 3.  Bilateral nephrolithiasis without ureteral calculus or hydronephrosis   Original  Report Authenticated By: D. HWallace Going MD    Dg Chest 2 View  06/03/2012  *RADIOLOGY REPORT*  Clinical Data: Fever.  Emesis.  CHEST - 2 VIEW  Comparison:  06/08/2011.  Findings:  Cardiopericardial silhouette within normal limits. Mediastinal contours normal. Trachea midline.  No airspace disease or effusion. Cholecystectomy clips are present in the right upper quadrant.  IMPRESSION: No active cardiopulmonary disease.   Original Report Authenticated By: Dereck Ligas, M.D.     Microbiology: Recent Results (from the past 240 hour(s))  URINE CULTURE     Status: Normal   Collection Time   06/07/12 12:25 PM      Component Value Range Status Comment   Specimen Description URINE, CLEAN CATCH   Final    Special Requests NONE   Final    Culture  Setup Time 06/07/2012 22:26   Final    Colony Count 50,000 COLONIES/ML   Final    Culture     Final    Value: Multiple bacterial morphotypes present, none predominant. Suggest appropriate recollection if clinically indicated.   Report Status 06/08/2012 FINAL   Final   MRSA PCR SCREENING     Status: Normal   Collection Time   06/07/12  3:28 PM      Component Value Range Status Comment   MRSA by PCR NEGATIVE  NEGATIVE Final   CULTURE, BLOOD (ROUTINE X 2)     Status: Normal   Collection Time   06/07/12 10:35 PM      Component Value Range Status Comment   Specimen Description BLOOD LEFT ANTECUBITAL   Final    Special Requests BOTTLES DRAWN AEROBIC AND ANAEROBIC 3CC   Final    Culture  Setup Time 06/08/2012 07:53   Final    Culture NO GROWTH 5 DAYS   Final    Report Status 06/14/2012 FINAL   Final   CLOSTRIDIUM DIFFICILE BY PCR     Status: Normal   Collection Time   06/07/12 10:37 PM      Component Value Range Status Comment   C difficile by pcr NEGATIVE  NEGATIVE Final   STOOL CULTURE     Status: Normal (Preliminary result)   Collection Time   06/07/12 10:38 PM      Component Value Range Status Comment   Specimen Description STOOL   Final    Special  Requests NONE   Final    Culture     Final    Value: SALMONELLA SPECIES     Note: CRITICAL RESULT CALLED TO, READ BACK BY AND VERIFIED WITH: ALLISON CHARGE RN @ 915AM BY VINCJ 06/11/12   Report Status PENDING   Incomplete    Organism ID, Bacteria SALMONELLA SPECIES   Final   CULTURE, BLOOD (ROUTINE X 2)     Status: Normal   Collection Time   06/07/12 10:45 PM      Component Value Range Status Comment   Specimen Description BLOOD RIGHT HAND   Final    Special Requests BOTTLES DRAWN AEROBIC ONLY 1.5CC   Final    Culture  Setup Time 06/08/2012 07:54   Final    Culture NO GROWTH 5 DAYS   Final    Report Status 06/14/2012 FINAL   Final      Labs: Basic Metabolic Panel:  Lab 63/89/37 0440 06/14/12 0802 06/13/12 0910 06/12/12 0522 06/11/12 1455 06/11/12 0610 06/09/12 0325  NA 144 143 141 140 -- 139 --  K 3.2* 3.6 2.3* 2.4* -- 2.6* --  CL 107 105 99 96 -- 95* --  CO2 25 25 30  36* -- 30 --  GLUCOSE 52* 74 114* 82 -- 129* --  BUN 31* 38* 50* 60* --  60* --  CREATININE 3.68* 4.78* 6.24* 8.27* -- 9.55* --  CALCIUM 6.6* 6.9* 7.0* 6.8* -- 6.9* --  MG 1.4* 1.8 1.6 1.5 -- 1.8 --  PHOS -- 2.4 1.0* -- 2.0* 2.0* 2.5   Liver Function Tests:  Lab 06/13/12 0910 06/11/12 0610 06/09/12 0325  AST -- -- --  ALT -- -- --  ALKPHOS -- -- --  BILITOT -- -- --  PROT -- -- --  ALBUMIN 2.2* 2.0* 1.8*   No results found for this basename: LIPASE:5,AMYLASE:5 in the last 168 hours No results found for this basename: AMMONIA:5 in the last 168 hours CBC:  Lab 06/11/12 0610 06/10/12 0345 06/09/12 0325  WBC 9.4 9.1 7.6  NEUTROABS -- -- --  HGB 9.8* 9.5* 9.8*  HCT 28.6* 26.8* 27.9*  MCV 90.8 89.0 90.9  PLT 145* 135* 120*   Cardiac Enzymes: No results found for this basename: CKTOTAL:5,CKMB:5,CKMBINDEX:5,TROPONINI:5 in the last 168 hours BNP: BNP (last 3 results) No results found for this basename: PROBNP:3 in the last 8760 hours CBG: No results found for this basename: GLUCAP:5 in the last 168  hours     Signed:  Seleena Reimers  [facility] 06/15/2012, 9:37 AM

## 2012-06-15 NOTE — Progress Notes (Signed)
S: feels better.  Diarrhea gone O:BP 96/63  Pulse 80  Temp 98.1 F (36.7 C) (Oral)  Resp 16  Ht 5' 5"  (1.651 m)  Wt 68.4 kg (150 lb 12.7 oz)  BMI 25.09 kg/m2  SpO2 100%  Intake/Output Summary (Last 24 hours) at 06/15/12 0850 Last data filed at 06/15/12 0500  Gross per 24 hour  Intake      0 ml  Output   1950 ml  Net  -1950 ml   Weight change: 0.814 kg (1 lb 12.7 oz) ZHY:QMVHQ and alert CVS:RRR Resp:clear Abd:+ BS NTND Ext:1-2 + edema NEURO:CNI Ox3      . calcium gluconate  1 g Intravenous Once  . cholestyramine light  4 g Oral BID  . enoxaparin (LOVENOX) injection  30 mg Subcutaneous Q24H  . feeding supplement  1 Container Oral BID BM  . folic acid  1 mg Oral Daily  . levothyroxine  125 mcg Oral QAC breakfast  . loperamide  2 mg Oral TID  . magnesium sulfate 1 - 4 g bolus IVPB  4 g Intravenous Once  . pantoprazole  40 mg Oral BID  . phosphorus  250 mg Oral BID  . potassium chloride  40 mEq Oral BID  . predniSONE  5 mg Oral Daily  . saccharomyces boulardii  250 mg Oral BID  . sodium chloride  3 mL Intravenous Q12H  . vitamin B-12  1,000 mcg Oral Daily   No results found. BMET    Component Value Date/Time   NA 144 06/15/2012 0440   K 3.2* 06/15/2012 0440   CL 107 06/15/2012 0440   CO2 25 06/15/2012 0440   GLUCOSE 52* 06/15/2012 0440   BUN 31* 06/15/2012 0440   CREATININE 3.68* 06/15/2012 0440   CALCIUM 6.6* 06/15/2012 0440   GFRNONAA 12* 06/15/2012 0440   GFRAA 14* 06/15/2012 0440   CBC    Component Value Date/Time   WBC 9.4 06/11/2012 0610   RBC 3.15* 06/11/2012 0610   HGB 9.8* 06/11/2012 0610   HCT 28.6* 06/11/2012 0610   PLT 145* 06/11/2012 0610   MCV 90.8 06/11/2012 0610   MCH 31.1 06/11/2012 0610   MCHC 34.3 06/11/2012 0610   RDW 13.8 06/11/2012 0610   LYMPHSABS 0.9 06/07/2012 1210   MONOABS 1.4* 06/07/2012 1210   EOSABS 0.1 06/07/2012 1210   BASOSABS 0.0 06/07/2012 1210     Assessment:  1. Acute on CKD 3 due to volume depletion and ATN, improving 2.  Salmonella gatroenteritis, improved  Plan: 1. DC IV fluids 2. She could be Dc'd from my standpoint.  I will arrange outpt appt in a few weeks but can check labs early next week 3.  If edema does not improve then she can call me and I will call in some lasix 4.  Will sign off, call if further issues.  Kaaren Nass T

## 2012-07-04 LAB — STOOL CULTURE

## 2013-05-12 ENCOUNTER — Inpatient Hospital Stay (HOSPITAL_COMMUNITY)
Admission: EM | Admit: 2013-05-12 | Discharge: 2013-05-18 | DRG: 385 | Disposition: A | Discharge status: Home / Self Care | Payer: Medicare Other | Attending: Internal Medicine | Admitting: Internal Medicine

## 2013-05-12 ENCOUNTER — Encounter (HOSPITAL_COMMUNITY): Payer: Self-pay | Admitting: Emergency Medicine

## 2013-05-12 DIAGNOSIS — K5090 Crohn's disease, unspecified, without complications: Secondary | ICD-10-CM

## 2013-05-12 DIAGNOSIS — R634 Abnormal weight loss: Secondary | ICD-10-CM

## 2013-05-12 DIAGNOSIS — E876 Hypokalemia: Secondary | ICD-10-CM

## 2013-05-12 DIAGNOSIS — IMO0002 Reserved for concepts with insufficient information to code with codable children: Secondary | ICD-10-CM

## 2013-05-12 DIAGNOSIS — N189 Chronic kidney disease, unspecified: Secondary | ICD-10-CM

## 2013-05-12 DIAGNOSIS — N289 Disorder of kidney and ureter, unspecified: Secondary | ICD-10-CM | POA: Diagnosis present

## 2013-05-12 DIAGNOSIS — E2749 Other adrenocortical insufficiency: Secondary | ICD-10-CM | POA: Diagnosis present

## 2013-05-12 DIAGNOSIS — E872 Acidosis, unspecified: Secondary | ICD-10-CM

## 2013-05-12 DIAGNOSIS — R197 Diarrhea, unspecified: Secondary | ICD-10-CM

## 2013-05-12 DIAGNOSIS — D649 Anemia, unspecified: Secondary | ICD-10-CM

## 2013-05-12 DIAGNOSIS — N184 Chronic kidney disease, stage 4 (severe): Secondary | ICD-10-CM

## 2013-05-12 DIAGNOSIS — E538 Deficiency of other specified B group vitamins: Secondary | ICD-10-CM | POA: Diagnosis present

## 2013-05-12 DIAGNOSIS — I129 Hypertensive chronic kidney disease with stage 1 through stage 4 chronic kidney disease, or unspecified chronic kidney disease: Secondary | ICD-10-CM | POA: Diagnosis present

## 2013-05-12 DIAGNOSIS — R112 Nausea with vomiting, unspecified: Secondary | ICD-10-CM

## 2013-05-12 DIAGNOSIS — K5000 Crohn's disease of small intestine without complications: Principal | ICD-10-CM | POA: Diagnosis present

## 2013-05-12 DIAGNOSIS — N179 Acute kidney failure, unspecified: Secondary | ICD-10-CM

## 2013-05-12 DIAGNOSIS — F17200 Nicotine dependence, unspecified, uncomplicated: Secondary | ICD-10-CM | POA: Diagnosis present

## 2013-05-12 DIAGNOSIS — K219 Gastro-esophageal reflux disease without esophagitis: Secondary | ICD-10-CM | POA: Diagnosis present

## 2013-05-12 DIAGNOSIS — E039 Hypothyroidism, unspecified: Secondary | ICD-10-CM | POA: Diagnosis present

## 2013-05-12 DIAGNOSIS — E43 Unspecified severe protein-calorie malnutrition: Secondary | ICD-10-CM | POA: Insufficient documentation

## 2013-05-12 DIAGNOSIS — R809 Proteinuria, unspecified: Secondary | ICD-10-CM | POA: Diagnosis present

## 2013-05-12 HISTORY — DX: Unspecified osteoarthritis, unspecified site: M19.90

## 2013-05-12 LAB — BASIC METABOLIC PANEL
BUN: 34 mg/dL — ABNORMAL HIGH (ref 6–23)
CO2: 9 mEq/L — CL (ref 19–32)
Calcium: 7.1 mg/dL — ABNORMAL LOW (ref 8.4–10.5)
Chloride: 123 mEq/L — ABNORMAL HIGH (ref 96–112)
Creatinine, Ser: 2.65 mg/dL — ABNORMAL HIGH (ref 0.50–1.10)
GFR calc Af Amer: 20 mL/min — ABNORMAL LOW (ref >90)
GFR calc non Af Amer: 18 mL/min — ABNORMAL LOW (ref >90)

## 2013-05-12 LAB — CBC WITH DIFFERENTIAL/PLATELET
Basophils Absolute: 0 K/uL (ref 0.0–0.1)
Basophils Relative: 0 % (ref 0–1)
Eosinophils Absolute: 0.1 K/uL (ref 0.0–0.7)
Eosinophils Relative: 1 % (ref 0–5)
HCT: 35.7 % — ABNORMAL LOW (ref 36.0–46.0)
Hemoglobin: 11.7 g/dL — ABNORMAL LOW (ref 12.0–15.0)
Lymphocytes Relative: 39 % (ref 12–46)
Lymphs Abs: 2.3 10*3/uL (ref 0.7–4.0)
MCH: 31.9 pg (ref 26.0–34.0)
MCHC: 32.8 g/dL (ref 30.0–36.0)
MCV: 97.3 fL (ref 78.0–100.0)
Monocytes Absolute: 0.6 10*3/uL (ref 0.1–1.0)
Monocytes Relative: 10 % (ref 3–12)
Neutro Abs: 3 10*3/uL (ref 1.7–7.7)
Neutrophils Relative %: 50 % (ref 43–77)
Platelets: 117 K/uL — ABNORMAL LOW (ref 150–400)
RBC: 3.67 MIL/uL — ABNORMAL LOW (ref 3.87–5.11)
RDW: 15.8 % — ABNORMAL HIGH (ref 11.5–15.5)
WBC: 5.9 10*3/uL (ref 4.0–10.5)

## 2013-05-12 LAB — COMPREHENSIVE METABOLIC PANEL WITH GFR
AST: 16 U/L (ref 0–37)
BUN: 36 mg/dL — ABNORMAL HIGH (ref 6–23)
Potassium: 2.4 meq/L — CL (ref 3.5–5.1)
Sodium: 141 meq/L (ref 135–145)
Total Protein: 6.8 g/dL (ref 6.0–8.3)

## 2013-05-12 LAB — COMPREHENSIVE METABOLIC PANEL
ALT: 12 U/L (ref 0–35)
Albumin: 3.4 g/dL — ABNORMAL LOW (ref 3.5–5.2)
Alkaline Phosphatase: 237 U/L — ABNORMAL HIGH (ref 39–117)
CO2: 13 mEq/L — ABNORMAL LOW (ref 19–32)
Calcium: 7.6 mg/dL — ABNORMAL LOW (ref 8.4–10.5)
Chloride: 115 mEq/L — ABNORMAL HIGH (ref 96–112)
Creatinine, Ser: 3.02 mg/dL — ABNORMAL HIGH (ref 0.50–1.10)
GFR calc Af Amer: 17 mL/min — ABNORMAL LOW (ref >90)
GFR calc non Af Amer: 15 mL/min — ABNORMAL LOW (ref >90)
Glucose, Bld: 66 mg/dL — ABNORMAL LOW (ref 70–99)
Total Bilirubin: 0.4 mg/dL (ref 0.3–1.2)

## 2013-05-12 LAB — MAGNESIUM: Magnesium: 1 mg/dL — ABNORMAL LOW (ref 1.5–2.5)

## 2013-05-12 LAB — OCCULT BLOOD, POC DEVICE: Fecal Occult Bld: NEGATIVE

## 2013-05-12 LAB — LIPASE, BLOOD: Lipase: 104 U/L — ABNORMAL HIGH (ref 11–59)

## 2013-05-12 MED ORDER — ONDANSETRON HCL 4 MG/2ML IJ SOLN
4.0000 mg | Freq: Once | INTRAMUSCULAR | Status: AC
  Administered 2013-05-12: 4 mg via INTRAVENOUS
  Filled 2013-05-12: qty 2

## 2013-05-12 MED ORDER — SODIUM CHLORIDE 0.9 % IV BOLUS (SEPSIS): 1000.0000 mL | Freq: Once | INTRAVENOUS | Status: DC

## 2013-05-12 MED ORDER — CALCITRIOL 0.5 MCG PO CAPS
0.5000 ug | ORAL_CAPSULE | Freq: Every day | ORAL | Status: DC
  Administered 2013-05-12 – 2013-05-18 (×7): 0.5 ug via ORAL
  Filled 2013-05-12 (×7): qty 1

## 2013-05-12 MED ORDER — FOLIC ACID 1 MG PO TABS
1.0000 mg | ORAL_TABLET | Freq: Every day | ORAL | Status: DC
  Administered 2013-05-12 – 2013-05-18 (×7): 1 mg via ORAL
  Filled 2013-05-12 (×7): qty 1

## 2013-05-12 MED ORDER — PREDNISONE 5 MG PO TABS
5.0000 mg | ORAL_TABLET | Freq: Every day | ORAL | Status: DC
  Administered 2013-05-12: 5 mg via ORAL
  Filled 2013-05-12 (×2): qty 1

## 2013-05-12 MED ORDER — HEPARIN SODIUM (PORCINE) 5000 UNIT/ML IJ SOLN
5000.0000 [IU] | Freq: Three times a day (TID) | INTRAMUSCULAR | Status: DC
  Administered 2013-05-12 – 2013-05-18 (×11): 5000 [IU] via SUBCUTANEOUS
  Filled 2013-05-12 (×20): qty 1

## 2013-05-12 MED ORDER — LEVOTHYROXINE SODIUM 125 MCG PO TABS
125.0000 ug | ORAL_TABLET | Freq: Every day | ORAL | Status: DC
  Administered 2013-05-13 – 2013-05-18 (×6): 125 ug via ORAL
  Filled 2013-05-12 (×7): qty 1

## 2013-05-12 MED ORDER — FLUTICASONE PROPIONATE 50 MCG/ACT NA SUSP
2.0000 | Freq: Every day | NASAL | Status: DC | PRN
  Filled 2013-05-12: qty 16

## 2013-05-12 MED ORDER — POTASSIUM CHLORIDE IN NACL 20-0.9 MEQ/L-% IV SOLN
INTRAVENOUS | Status: DC
  Administered 2013-05-12: 23:00:00 via INTRAVENOUS
  Administered 2013-05-13: 1000 mL via INTRAVENOUS
  Administered 2013-05-13: 21:00:00 via INTRAVENOUS
  Filled 2013-05-12 (×5): qty 1000

## 2013-05-12 MED ORDER — ONDANSETRON HCL 4 MG PO TABS
4.0000 mg | ORAL_TABLET | Freq: Four times a day (QID) | ORAL | Status: DC | PRN
  Administered 2013-05-13 – 2013-05-15 (×3): 4 mg via ORAL
  Filled 2013-05-12 (×3): qty 1

## 2013-05-12 MED ORDER — POTASSIUM CHLORIDE IN NACL 20-0.9 MEQ/L-% IV SOLN
Freq: Once | INTRAVENOUS | Status: AC
  Administered 2013-05-12: 19:00:00 via INTRAVENOUS
  Filled 2013-05-12: qty 1000

## 2013-05-12 MED ORDER — MAGNESIUM SULFATE 40 MG/ML IJ SOLN
2.0000 g | Freq: Once | INTRAMUSCULAR | Status: AC
  Administered 2013-05-13: 2 g via INTRAVENOUS
  Filled 2013-05-12: qty 50

## 2013-05-12 MED ORDER — POTASSIUM CHLORIDE CRYS ER 20 MEQ PO TBCR
40.0000 meq | EXTENDED_RELEASE_TABLET | Freq: Once | ORAL | Status: AC
  Administered 2013-05-12: 40 meq via ORAL
  Filled 2013-05-12: qty 2

## 2013-05-12 MED ORDER — ONDANSETRON HCL 4 MG/2ML IJ SOLN
4.0000 mg | Freq: Four times a day (QID) | INTRAMUSCULAR | Status: DC | PRN
  Administered 2013-05-15 – 2013-05-17 (×2): 4 mg via INTRAVENOUS
  Filled 2013-05-12 (×2): qty 2

## 2013-05-12 MED ORDER — STERILE WATER FOR INJECTION IV SOLN
INTRAVENOUS | Status: DC
  Administered 2013-05-13: 01:00:00 via INTRAVENOUS
  Filled 2013-05-12 (×5): qty 850

## 2013-05-12 MED ORDER — SODIUM CHLORIDE 0.9 % IJ SOLN
3.0000 mL | Freq: Two times a day (BID) | INTRAMUSCULAR | Status: DC
  Administered 2013-05-13 – 2013-05-17 (×4): 3 mL via INTRAVENOUS

## 2013-05-12 MED ORDER — POTASSIUM CHLORIDE ER 10 MEQ PO TBCR
20.0000 meq | EXTENDED_RELEASE_TABLET | Freq: Every day | ORAL | Status: DC
  Administered 2013-05-13 – 2013-05-15 (×3): 20 meq via ORAL
  Filled 2013-05-12 (×3): qty 2

## 2013-05-12 NOTE — ED Notes (Signed)
Pt sent here from pcp office for possible admission. Pt having diarrhea x 3 months, now n/v x 2 weeks and weight loss.

## 2013-05-12 NOTE — H&P (Signed)
[facility] History and Physical  Patient: Natasha [name]  JHE:[id number]  DOB: [birth year]  DOS: the patient was seen and examined on 05/12/2013 PCP: Garlan Fair, MD  Chief Complaint: Nausea and vomiting with diarrhea  HPI: Natasha [name] is a 67 y.o. female with Past medical history of Crohn's disease, GERD, CK-MB, hypertension, hypothyroidism. The patient is coming from home. The patient presents with complaints of on and off nausea and vomiting associated with diarrhea. She mentions that since September She has been having the symptoms on and off with intermittent abdominal pain. She has history of chronic kidney disease and she mentions that she has been placed on different medications for her kidney disease by her nephrologist and she thinks the medication that she got in September the name of which she does not remember worsen her symptoms from GI perspective. She started having more nausea and vomiting. The vomiting is on a daily basis once or twice a day without any blood. She has abdominal pain located in the right side as well as in the suprapubic area which is on and off intermittent, for slight crampy. She complains of diarrhea, she has history of Crohn's disease and has been on prednisone for long, she mentions 4-5 episodes of loose watery bowel movements without any blood. She denies any burning urination. She mentions she might have lost weight over 20 pounds in the last few weeks. She denies any dizziness, chest pain, shortness of breath, fever. She feels that she has some chills. She is compliant with her medications. Currently her only complaint is lethargy.  Review of Systems: as mentioned in the history of present illness.  A Comprehensive review of the other systems is negative.  Past Medical History  Diagnosis Date  . Chronic kidney disease   . GERD (gastroesophageal reflux disease)   . Crohn's colitis   . CKD (chronic kidney disease) 06/09/2012  .  Hypothyroidism 06/10/2012   Past Surgical History  Procedure Laterality Date  . Colon resection    . Esophagogastroduodenoscopy  03/04/2012    Procedure: ESOPHAGOGASTRODUODENOSCOPY (EGD);  Surgeon: Garlan Fair, MD;  Location: [facility];  Service: Endoscopy;  Laterality: N/A;  . Flexible sigmoidoscopy  03/04/2012    Procedure: FLEXIBLE SIGMOIDOSCOPY;  Surgeon: Garlan Fair, MD;  Location: [facility];  Service: Endoscopy;  Laterality: N/A;   Social History:  reports that she has been smoking.  She has never used smokeless tobacco. She reports that she drinks about 1.2 ounces of alcohol per week. Her drug history is not on file. Independent for most of her  ADL.  Allergies  Allergen Reactions  . Mercaptopurine Other (See Comments)    Unknown reaction  . Remicade [Infliximab] Other (See Comments)    Unknown reaction    History reviewed. No pertinent family history.  Prior to Admission medications   Medication Sig Start Date End Date Taking? Authorizing Provider  acetaminophen (TYLENOL) 500 MG tablet Take 500 mg by mouth every 6 (six) hours as needed. For severe pain   Yes Historical Provider, MD  calcitRIOL (ROCALTROL) 0.5 MCG capsule Take 0.5 mcg by mouth daily.   Yes Historical Provider, MD  cyanocobalamin (,VITAMIN B-12,) 1000 MCG/ML injection Inject 1,000 mcg into the muscle every 30 (thirty) days. Last injection was 04/28/12   Yes Historical Provider, MD  fluticasone (FLONASE) 50 MCG/ACT nasal spray Place 2 sprays into both nostrils daily as needed for allergies or rhinitis.   Yes Historical Provider, MD  folic acid (FOLVITE) 1 MG  tablet Take 1 mg by mouth daily.   Yes Historical Provider, MD  guaiFENesin (MUCINEX) 600 MG 12 hr tablet Take 600 mg by mouth 2 (two) times daily as needed for cough or to loosen phlegm.   Yes Historical Provider, MD  levothyroxine (SYNTHROID, LEVOTHROID) 125 MCG tablet Take 125 mcg by mouth daily.   Yes Historical Provider, MD  potassium  chloride (K-DUR) 10 MEQ tablet Take 2 tablets (20 mEq total) by mouth daily. 06/15/12  Yes Sorin June Leap, MD  predniSONE (DELTASONE) 5 MG tablet Take 5 mg by mouth daily.   Yes Historical Provider, MD  risedronate (ACTONEL) 35 MG tablet Take 35 mg by mouth every 7 (seven) days. with water on empty stomach, nothing by mouth or lie down for next 30 minutes. Takes on Monday   Yes Historical Provider, MD  vitamin B-12 (CYANOCOBALAMIN) 1000 MCG tablet Take 1,000 mcg by mouth daily.   Yes Historical Provider, MD    Physical Exam: Filed Vitals:   05/12/13 1830 05/12/13 1837 05/12/13 1900 05/12/13 2054  BP: 117/72 117/72 102/86 110/64  Pulse: 82  79 82  Temp:  98.8 F (37.1 C)  98.6 F (37 C)  TempSrc:  Oral  Oral  Resp:  17 20   Height:    5' 2"  (1.575 m)  Weight:    47.991 kg (105 lb 12.8 oz)  SpO2: 100% 100% 100% 100%    General: Alert, Awake and Oriented to Time, Place and Person. Appear in mild distress Eyes: PERRL ENT: Oral Mucosa clear dry. Neck: No JVD Cardiovascular: S1 and S2 Present, no Murmur, Peripheral Pulses Present Respiratory: Bilateral Air entry equal and Decreased, Clear to Auscultation,  No Crackles, no wheezes Abdomen: Bowel Sound Present, Soft and minimally diffusely tender no guarding or rigidity Skin: No Rash Extremities: No Pedal edema, no calf tenderness Neurologic: Grossly Unremarkable.  Labs on Admission:  CBC:  Recent Labs Lab 05/12/13 1445  WBC 5.9  NEUTROABS 3.0  HGB 11.7*  HCT 35.7*  MCV 97.3  PLT 117*    CMP     Component Value Date/Time   NA 141 05/12/2013 1445   K 2.4* 05/12/2013 1445   CL 115* 05/12/2013 1445   CO2 13* 05/12/2013 1445   GLUCOSE 66* 05/12/2013 1445   BUN 36* 05/12/2013 1445   CREATININE 3.02* 05/12/2013 1445   CALCIUM 7.6* 05/12/2013 1445   PROT 6.8 05/12/2013 1445   ALBUMIN 3.4* 05/12/2013 1445   AST 16 05/12/2013 1445   ALT 12 05/12/2013 1445   ALKPHOS 237* 05/12/2013 1445   BILITOT 0.4 05/12/2013 1445    GFRNONAA 15* 05/12/2013 1445   GFRAA 17* 05/12/2013 1445     Recent Labs Lab 05/12/13 1445  LIPASE 104*   No results found for this basename: AMMONIA,  in the last 168 hours  No results found for this basename: CKTOTAL, CKMB, CKMBINDEX, TROPONINI,  in the last 168 hours BNP (last 3 results) No results found for this basename: PROBNP,  in the last 8760 hours  Radiological Exams on Admission: No results found.  EKG: Independently reviewed. normal sinus rhythm, nonspecific ST and T waves changes.  Assessment/Plan Principal Problem:   Hypokalemia Active Problems:   Crohn's disease   Diarrhea   CKD (chronic kidney disease)   Hypothyroidism   1. Hypokalemia The patient is presenting with hypokalemia associated with nausea and vomiting as well as diarrhea. She will be given IV potassium, I would recheck her potassium level at midnight along with  check a magnesium level and replace as needed. She was also given oral potassium and IV fluids with potassium. She does not appear to have any significant changes on EKG we will monitor her on telemetry She will be admitted for observation  2. Chronic kidney disease Significant improvement in her renal function, but she has also lost weight therefore possibly improvement in renal function is secondary to loss of muscle mass Avoid nephrotoxic medication Follow daily BMP  3. Crohn's disease and diarrhea I will check an x-ray of the abdomen to rule out any obstruction I would also check C. difficile and ESR as well as CRP At present her history has been ongoing since last few months therefore I do not suspect acute flare up of Crohn's disease May need to discuss with her GI doctor in morning  4. Hypothyroidism Continue Synthroid  DVT Prophylaxis: subcutaneous Heparin Nutrition: Clear liquid  Code Status: Full  Disposition: Admitted to observation in telemetry unit.  Author: Berle Mull, MD [facility] Pager:  [phone] 05/12/2013, 9:18 PM    If 7PM-7AM, please contact night-coverage [email or web address] Password [email or web address]

## 2013-05-12 NOTE — ED Notes (Signed)
Nurse first rounds, delay explained to patient.

## 2013-05-12 NOTE — ED Provider Notes (Signed)
CSN: [id number]     Arrival date & time 05/12/13  1424 History   First MD Initiated Contact with Patient 05/12/13 1744     Chief Complaint  Patient presents with  . Emesis  . Diarrhea   (Consider location/radiation/quality/duration/timing/severity/associated sxs/prior Treatment) HPI Comments: Patient has a history of Crohn's disease, chronic kidney disease followed by both Dr. Wynetta Emery with [facility] as well as [facility]. She reports that over the past 8 or 9 months, she's had intermittent problems with her kidneys, and in the last 2 months worsening episodes of nausea vomiting and diarrhea with decreased appetite and gradual weight loss. She reports that she had been feeling poorly, worse than usual over the last several days, head gone to see her primary care doctor more for a sinus issue with congestion, and they discovered that she had lost approximately 15 pounds over the last several months. After evaluation by Dr. Wynetta Emery, he recommended the patient be sent to the emergency department for admission for further evaluation of her symptoms as well as concerned regarding her weight loss. Patient reports currently she has no abdominal pain, is currently nauseated. She has no back pain or chest pain. She denies any coughing. She reports no blood in her stool, has not seen any black stools in a few months. She reports her stools have generally been loose, not necessarily very frequent, has not eaten anything today, has not vomited today. Patient denies any recent antibiotic use, no foreign travel, no obvious sick contacts. Has not generally changed her diet in any way and is not purposely trying to lose weight.  Patient is a 67 y.o. female presenting with diarrhea. The history is provided by the patient and the spouse.  Diarrhea Associated symptoms: vomiting   Associated symptoms: no headaches     Past Medical History  Diagnosis Date  . Chronic kidney disease   .  GERD (gastroesophageal reflux disease)   . Crohn's colitis   . CKD (chronic kidney disease) 06/09/2012  . Hypothyroidism 06/10/2012   Past Surgical History  Procedure Laterality Date  . Colon resection    . Esophagogastroduodenoscopy  03/04/2012    Procedure: ESOPHAGOGASTRODUODENOSCOPY (EGD);  Surgeon: Garlan Fair, MD;  Location: [facility];  Service: Endoscopy;  Laterality: N/A;  . Flexible sigmoidoscopy  03/04/2012    Procedure: FLEXIBLE SIGMOIDOSCOPY;  Surgeon: Garlan Fair, MD;  Location: [facility];  Service: Endoscopy;  Laterality: N/A;   History reviewed. No pertinent family history. History  Substance Use Topics  . Smoking status: Current Every Day Smoker -- 0.25 packs/day  . Smokeless tobacco: Never Used  . Alcohol Use: 1.2 oz/week    2 Glasses of wine per week   OB History   Grav Para Term Preterm Abortions TAB SAB Ect Mult Living                 Review of Systems  Constitutional: Positive for fatigue.  HENT: Positive for congestion.   Respiratory: Negative for shortness of breath.   Cardiovascular: Negative for chest pain.  Gastrointestinal: Positive for nausea, vomiting and diarrhea. Negative for blood in stool.  Neurological: Positive for weakness. Negative for syncope, speech difficulty, light-headedness and headaches.  All other systems reviewed and are negative.    Allergies  Mercaptopurine and Remicade  Home Medications   Current Outpatient Rx  Name  Route  Sig  Dispense  Refill  . acetaminophen (TYLENOL) 500 MG tablet   Oral   Take 500 mg by mouth  every 6 (six) hours as needed. For severe pain         . calcitRIOL (ROCALTROL) 0.5 MCG capsule   Oral   Take 0.5 mcg by mouth daily.         . cyanocobalamin (,VITAMIN B-12,) 1000 MCG/ML injection   Intramuscular   Inject 1,000 mcg into the muscle every 30 (thirty) days. Last injection was 04/28/12         . fluticasone (FLONASE) 50 MCG/ACT nasal spray   Each Nare   Place 2 sprays  into both nostrils daily as needed for allergies or rhinitis.         . folic acid (FOLVITE) 1 MG tablet   Oral   Take 1 mg by mouth daily.         [name] guaiFENesin (MUCINEX) 600 MG 12 hr tablet   Oral   Take 600 mg by mouth 2 (two) times daily as needed for cough or to loosen phlegm.         [name] levothyroxine (SYNTHROID, LEVOTHROID) 125 MCG tablet   Oral   Take 125 mcg by mouth daily.         . potassium chloride (K-DUR) 10 MEQ tablet   Oral   Take 2 tablets (20 mEq total) by mouth daily.   10 tablet   0   . predniSONE (DELTASONE) 5 MG tablet   Oral   Take 5 mg by mouth daily.         . risedronate (ACTONEL) 35 MG tablet   Oral   Take 35 mg by mouth every 7 (seven) days. with water on empty stomach, nothing by mouth or lie down for next 30 minutes. Takes on Monday         . vitamin B-12 (CYANOCOBALAMIN) 1000 MCG tablet   Oral   Take 1,000 mcg by mouth daily.          BP 117/72  Pulse 80  Temp(Src) 98.8 F (37.1 C) (Oral)  Resp 17  Ht 5' 2"  (1.575 m)  Wt 108 lb 12.8 oz (49.351 kg)  BMI 19.89 kg/m2  SpO2 100% Physical Exam  Nursing note and vitals reviewed. Constitutional: She is oriented to person, place, and time. She appears well-developed and well-nourished.  HENT:  Head: Normocephalic and atraumatic.  Mouth/Throat: No oropharyngeal exudate.  Eyes: Conjunctivae and EOM are normal. No scleral icterus.  Neck: Normal range of motion. Neck supple.  Cardiovascular: Normal rate, regular rhythm and intact distal pulses.   No murmur heard. Pulmonary/Chest: Effort normal. No respiratory distress. She has no wheezes. She has no rales.  Abdominal: Soft. She exhibits no distension. There is no tenderness. There is no rebound.  Neurological: She is alert and oriented to person, place, and time. She exhibits normal muscle tone. Coordination normal.  Skin: Skin is warm and dry. No rash noted. No pallor.  Psychiatric: She has a normal mood and affect.    ED  Course  Procedures (including critical care time) Labs Review Labs Reviewed  CBC WITH DIFFERENTIAL - Abnormal; Notable for the following:    RBC 3.67 (*)    Hemoglobin 11.7 (*)    HCT 35.7 (*)    RDW 15.8 (*)    Platelets 117 (*)    All other components within normal limits  COMPREHENSIVE METABOLIC PANEL - Abnormal; Notable for the following:    Potassium 2.4 (*)    Chloride 115 (*)    CO2 13 (*)    Glucose, Bld 66 (*)  BUN 36 (*)    Creatinine, Ser 3.02 (*)    Calcium 7.6 (*)    Albumin 3.4 (*)    Alkaline Phosphatase 237 (*)    GFR calc non Af Amer 15 (*)    GFR calc Af Amer 17 (*)    All other components within normal limits  LIPASE, BLOOD - Abnormal; Notable for the following:    Lipase 104 (*)    All other components within normal limits  URINALYSIS, ROUTINE W REFLEX MICROSCOPIC  OCCULT BLOOD, POC DEVICE   Imaging Review No results found.  EKG Interpretation    Date/Time:  Thursday May 12 2013 18:36:38 EST Ventricular Rate:  76 PR Interval:  147 QRS Duration: 78 QT Interval:  408 QTC Calculation: 459 R Axis:   38 Text Interpretation:  Sinus rhythm Consider left ventricular hypertrophy Anterior Q waves, possibly due to LVH Borderline ECG Confirmed by [facility]  MD, MICHEAL ([id number]) on 05/12/2013 7:23:59 PM           Room air saturation is 100% and I interpret this to be normal    MDM   1. Nausea vomiting and diarrhea   2. Hypokalemia   3. Weight loss   4. Hypocalcemia      I reviewed paperwork sent from [facility] office. Dr. Wynetta Emery recommends the patient be admitted for the above symptoms. Blood work that was obtained from triage shows a low potassium of 2.4, which the patient has had in the past. The patient's BUN and creatinine are elevated but seemed to be at or near baseline. Clinically, the patient appears in endorses feeling ill, but does not appear toxic in my opinion. Abdomen is soft, heart rate is regular, lungs are clear with no  episodes of coughing here in emergency department.    Saddie Benders. Dorna Mai, MD 05/12/13 1924

## 2013-05-12 NOTE — ED Notes (Signed)
Report given to [place], RN unit 3W.

## 2013-05-13 ENCOUNTER — Observation Stay (HOSPITAL_COMMUNITY): Payer: Medicare Other

## 2013-05-13 DIAGNOSIS — N179 Acute kidney failure, unspecified: Secondary | ICD-10-CM

## 2013-05-13 DIAGNOSIS — N184 Chronic kidney disease, stage 4 (severe): Secondary | ICD-10-CM

## 2013-05-13 LAB — COMPREHENSIVE METABOLIC PANEL
AST: 14 U/L (ref 0–37)
Albumin: 2.7 g/dL — ABNORMAL LOW (ref 3.5–5.2)
Alkaline Phosphatase: 205 U/L — ABNORMAL HIGH (ref 39–117)
BUN: 31 mg/dL — ABNORMAL HIGH (ref 6–23)
CO2: 12 mEq/L — ABNORMAL LOW (ref 19–32)
Chloride: 119 mEq/L — ABNORMAL HIGH (ref 96–112)
Creatinine, Ser: 2.64 mg/dL — ABNORMAL HIGH (ref 0.50–1.10)
GFR calc Af Amer: 20 mL/min — ABNORMAL LOW (ref >90)
GFR calc non Af Amer: 18 mL/min — ABNORMAL LOW (ref >90)
Potassium: 2.8 mEq/L — ABNORMAL LOW (ref 3.5–5.1)
Total Bilirubin: 0.4 mg/dL (ref 0.3–1.2)

## 2013-05-13 LAB — GI PATHOGEN PANEL BY PCR, STOOL
C difficile toxin A/B: NEGATIVE
Cryptosporidium by PCR: NEGATIVE
E coli (ETEC) LT/ST: NEGATIVE
E coli (STEC): NEGATIVE
E coli 0157 by PCR: NEGATIVE
Norovirus GI/GII: NEGATIVE
Salmonella by PCR: POSITIVE
Shigella by PCR: NEGATIVE

## 2013-05-13 LAB — URINALYSIS, ROUTINE W REFLEX MICROSCOPIC
Bilirubin Urine: NEGATIVE
Glucose, UA: NEGATIVE mg/dL
Ketones, ur: NEGATIVE mg/dL
Leukocytes, UA: NEGATIVE
Nitrite: NEGATIVE
Protein, ur: NEGATIVE mg/dL
Specific Gravity, Urine: 1.008 (ref 1.005–1.030)
Urobilinogen, UA: 0.2 mg/dL (ref 0.0–1.0)
pH: 6 (ref 5.0–8.0)

## 2013-05-13 LAB — CBC WITH DIFFERENTIAL/PLATELET
Basophils Relative: 0 % (ref 0–1)
HCT: 31.3 % — ABNORMAL LOW (ref 36.0–46.0)
Hemoglobin: 10.1 g/dL — ABNORMAL LOW (ref 12.0–15.0)
Lymphocytes Relative: 24 % (ref 12–46)
MCHC: 32.3 g/dL (ref 30.0–36.0)
Monocytes Relative: 5 % (ref 3–12)
Neutro Abs: 3.6 10*3/uL (ref 1.7–7.7)
Neutrophils Relative %: 70 % (ref 43–77)
RBC: 3.22 MIL/uL — ABNORMAL LOW (ref 3.87–5.11)
WBC: 5.2 10*3/uL (ref 4.0–10.5)

## 2013-05-13 LAB — URINE MICROSCOPIC-ADD ON

## 2013-05-13 LAB — POTASSIUM: Potassium: 3.4 mEq/L — ABNORMAL LOW (ref 3.5–5.1)

## 2013-05-13 LAB — CLOSTRIDIUM DIFFICILE BY PCR: Toxigenic C. Difficile by PCR: NEGATIVE

## 2013-05-13 MED ORDER — BOOST / RESOURCE BREEZE PO LIQD
1.0000 | Freq: Two times a day (BID) | ORAL | Status: DC
  Administered 2013-05-13 – 2013-05-18 (×6): 1 via ORAL

## 2013-05-13 MED ORDER — POTASSIUM CHLORIDE CRYS ER 20 MEQ PO TBCR
40.0000 meq | EXTENDED_RELEASE_TABLET | ORAL | Status: AC
  Administered 2013-05-13 (×2): 40 meq via ORAL
  Filled 2013-05-13 (×2): qty 2

## 2013-05-13 MED ORDER — BOOST PLUS PO LIQD
237.0000 mL | Freq: Two times a day (BID) | ORAL | Status: DC
  Administered 2013-05-14 – 2013-05-18 (×2): 237 mL via ORAL
  Filled 2013-05-13 (×14): qty 237

## 2013-05-13 MED ORDER — POTASSIUM CHLORIDE CRYS ER 20 MEQ PO TBCR
30.0000 meq | EXTENDED_RELEASE_TABLET | Freq: Once | ORAL | Status: AC
  Administered 2013-05-13: 01:00:00 30 meq via ORAL
  Filled 2013-05-13 (×2): qty 1

## 2013-05-13 MED ORDER — PREDNISONE 20 MG PO TABS
40.0000 mg | ORAL_TABLET | Freq: Every day | ORAL | Status: DC
  Administered 2013-05-13 – 2013-05-18 (×6): 40 mg via ORAL
  Filled 2013-05-13 (×6): qty 2

## 2013-05-13 NOTE — Progress Notes (Signed)
Patient ID: Natasha [name]  female  CBS:[id number]    DOB: [birth year]    DOA: 05/12/2013  PCP: Garlan Fair, MD  Assessment/Plan: Principal Problem:   Hypokalemia with metabolic acidosis likely due to GI losses - Will aggressively replace potassium IV and oral - Recheck potassium later today at 1 PM  Active Problems:   Crohn's disease - C. difficile negative - Nausea improved, patient is on maintenance prednisone at 5 mg daily, increased it to 40 mg for now - Advance diet to regular    Diarrhea - Improving, continue gentle hydration C. difficile negative    CKD (chronic kidney disease) - Follows Dr. Mercy Moore, creatinine function currently close to her baseline     Hypothyroidism Continue Synthroid   DVT Prophylaxis:  Code Status:  Family Communication: discussed in detail with the patient   Disposition:Patient's primary care PCP and gastroenterologist is Dr. Gaston Islam at [facility]. I have called and talked to Dr. Laurann Montana who will assume care in AM. TRH will sign off    Subjective: Nausea vomiting and diarrhea is improving, denies any  fever chills or abdominal pain   Objective: Weight change:   Intake/Output Summary (Last 24 hours) at 05/13/13 0958 Last data filed at 05/13/13 0725  Gross per 24 hour  Intake      0 ml  Output    325 ml  Net   -325 ml   Blood pressure 104/64, pulse 67, temperature 98.4 F (36.9 C), temperature source Oral, resp. rate 18, height 5' 2"  (1.575 m), weight 47.99 kg (105 lb 12.8 oz), SpO2 99.00%.  Physical Exam: General: Alert and awake, oriented x3, not in any acute distress. CVS: S1-S2 clear, no murmur rubs or gallops Chest: clear to auscultation bilaterally, no wheezing, rales or rhonchi Abdomen: soft nontender, nondistended, normal bowel sounds  Extremities: no cyanosis, clubbing or edema noted bilaterally   Lab Results: Basic Metabolic Panel:  Recent Labs Lab 05/12/13 2245 05/13/13 0630  NA 144  144  K 2.9* 2.8*  CL 123* 119*  CO2 9* 12*  GLUCOSE 69* 67*  BUN 34* 31*  CREATININE 2.65* 2.64*  CALCIUM 7.1* 7.1*  MG 1.0* 1.8   Liver Function Tests:  Recent Labs Lab 05/12/13 1445 05/13/13 0630  AST 16 14  ALT 12 10  ALKPHOS 237* 205*  BILITOT 0.4 0.4  PROT 6.8 5.5*  ALBUMIN 3.4* 2.7*    Recent Labs Lab 05/12/13 1445  LIPASE 104*   No results found for this basename: AMMONIA,  in the last 168 hours CBC:  Recent Labs Lab 05/12/13 1445 05/13/13 0630  WBC 5.9 5.2  NEUTROABS 3.0 3.6  HGB 11.7* 10.1*  HCT 35.7* 31.3*  MCV 97.3 97.2  PLT 117* 110*   Cardiac Enzymes: No results found for this basename: CKTOTAL, CKMB, CKMBINDEX, TROPONINI,  in the last 168 hours BNP: No components found with this basename: POCBNP,  CBG: No results found for this basename: GLUCAP,  in the last 168 hours   Micro Results: Recent Results (from the past 240 hour(s))  CLOSTRIDIUM DIFFICILE BY PCR     Status: None   Collection Time    05/13/13  5:32 AM      Result Value Range Status   C difficile by pcr NEGATIVE  NEGATIVE Final    Studies/Results: Dg Abd Acute W/chest  05/13/2013   CLINICAL DATA:  Nausea and vomiting.  Slow bowel sounds.  EXAM: ACUTE ABDOMEN SERIES (ABDOMEN 2 VIEW & CHEST 1 VIEW)  COMPARISON:  Abdominal CT 06/07/2012  FINDINGS: Hyperinflated lungs. No acute infiltrate or edema. No effusion or pneumothorax. No evidence of bowel obstruction. There is distal colonic diverticulosis. Questionable fluid levels within the distal colon, which also may have rugal fold thickening. Previous bowel surgery with sutures in the lower abdomen. Cholecystectomy. No nephrolithiasis not well demonstrated.  IMPRESSION: 1. Negative for bowel obstruction or perforation. 2. Questionable colonic wall thickening distally, which would suggest colitis. 3. Clear chest.   Electronically Signed   By: Jorje Guild M.D.   On: 05/13/2013 00:59    Medications: Scheduled Meds: . calcitRIOL   0.5 mcg Oral Daily  . folic acid  1 mg Oral Daily  . heparin  5,000 Units Subcutaneous Q8H  . levothyroxine  125 mcg Oral QAC breakfast  . potassium chloride  20 mEq Oral Daily  . potassium chloride  40 mEq Oral Q4H  . predniSONE  5 mg Oral Daily  . sodium chloride  3 mL Intravenous Q12H      LOS: 1 day   RAI,RIPUDEEP M.D. [facility] 05/13/2013, 9:58 AM Pager: [phone]  If 7PM-7AM, please contact night-coverage [email or web address] Password [email or web address]

## 2013-05-13 NOTE — Progress Notes (Signed)
INITIAL NUTRITION ASSESSMENT  DOCUMENTATION CODES Per approved criteria  -Severe malnutrition in the context of chronic illness  Pt meets criteria for severe MALNUTRITION in the context of chronic illness as evidenced by wt loss of 21% in the past year and severe fat and muscle wasting.  INTERVENTION: - Resource Breeze po BID, each supplement provides 250 kcal and 9 grams of protein - Boost Plus BID, each supplement provides 360 kcal and 14 g of protein  NUTRITION DIAGNOSIS: Increased nutrient needs related to crohn's disease as evidenced by wt loss.   Goal: Pt to meet >/= 90% of their estimated nutrition needs   Monitor:  Wt, po intake, toleration of nutritional supplements, labs  Reason for Assessment: MST  67 y.o. female  Admitting Dx: Hypokalemia  ASSESSMENT: 67 y.o. female with Past medical history of Crohn's disease, GERD, CK-MB, hypertension, hypothyroidism.  The patient is coming from home.  The patient presents with complaints of on and off nausea and vomiting associated with diarrhea.  Pt reports that her usual body weight is around 132 lbs. She last weighed this much around February or March 2014. Pt reports having a good appetite, but that she has had terrible diarrhea related to Crohn's disease. Pt tolerated clear liquid diet this am, and has been upgraded to a heart healthy diet. Pt will be sent both Resource Breeze and Boost Plus to see which she tolerates better. Pt reported having a loose bowel movement this am, but that it was more solid than it has been.  Nutrition Focused Physical Exam:  Subcutaneous Fat:  Orbital Region: moderate wasting Upper Arm Region: severe wasting Thoracic and Lumbar Region: severe wasting  Muscle:  Temple Region: severe wasting Clavicle Bone Region: severe wasting Clavicle and Acromion Bone Region: severe wasting Scapular Bone Region: severe wasting Dorsal Hand: severe wasting Patellar Region: moderate wasting Anterior Thigh  Region: moderate wasting Posterior Calf Region: moderate wasting  Edema: none  Height: Ht Readings from Last 1 Encounters:  05/12/13 5' 2"  (1.575 m)    Weight: Wt Readings from Last 1 Encounters:  05/13/13 105 lb 12.8 oz (47.99 kg)    Ideal Body Weight: 50.1 kg  % Ideal Body Weight: 104%  Wt Readings from Last 10 Encounters:  05/13/13 105 lb 12.8 oz (47.99 kg)  06/14/12 150 lb 12.7 oz (68.4 kg)  03/04/12 127 lb (57.607 kg)  03/04/12 127 lb (57.607 kg)    Usual Body Weight: 132 lbs  % Usual Body Weight: 80%  BMI:  Body mass index is 19.35 kg/(m^2).  Estimated Nutritional Needs: Kcal: 1450-1700 Protein: 65-75 g Fluid: 1.5-1.7 L/day  Skin: WNL  Diet Order: Cardiac  EDUCATION NEEDS: -Education needs addressed   Intake/Output Summary (Last 24 hours) at 05/13/13 1141 Last data filed at 05/13/13 1120  Gross per 24 hour  Intake      3 ml  Output    325 ml  Net   -322 ml    Last BM: 12/19   Labs:   Recent Labs Lab 05/12/13 1445 05/12/13 2245 05/13/13 0630  NA 141 144 144  K 2.4* 2.9* 2.8*  CL 115* 123* 119*  CO2 13* 9* 12*  BUN 36* 34* 31*  CREATININE 3.02* 2.65* 2.64*  CALCIUM 7.6* 7.1* 7.1*  MG  --  1.0* 1.8  GLUCOSE 66* 69* 67*    CBG (last 3)  No results found for this basename: GLUCAP,  in the last 72 hours  Scheduled Meds: . calcitRIOL  0.5 mcg Oral Daily  .  folic acid  1 mg Oral Daily  . heparin  5,000 Units Subcutaneous Q8H  . levothyroxine  125 mcg Oral QAC breakfast  . potassium chloride  20 mEq Oral Daily  . potassium chloride  40 mEq Oral Q4H  . predniSONE  40 mg Oral Daily  . sodium chloride  3 mL Intravenous Q12H    Continuous Infusions: . 0.9 % NaCl with KCl 20 mEq / L 1,000 mL (05/13/13 0815)  .  sodium bicarbonate 150 mEq in sterile water 1000 mL infusion 100 mL/hr at 05/13/13 0115    Past Medical History  Diagnosis Date  . Chronic kidney disease   . GERD (gastroesophageal reflux disease)   . Crohn's colitis   .  CKD (chronic kidney disease) 06/09/2012  . Hypothyroidism 06/10/2012    Past Surgical History  Procedure Laterality Date  . Colon resection    . Esophagogastroduodenoscopy  03/04/2012    Procedure: ESOPHAGOGASTRODUODENOSCOPY (EGD);  Surgeon: Garlan Fair, MD;  Location: [facility];  Service: Endoscopy;  Laterality: N/A;  . Flexible sigmoidoscopy  03/04/2012    Procedure: FLEXIBLE SIGMOIDOSCOPY;  Surgeon: Garlan Fair, MD;  Location: [facility];  Service: Endoscopy;  Laterality: N/A;    Terrace Arabia RD, LDN

## 2013-05-13 NOTE — Progress Notes (Signed)
RN paged NP with critical lab value of CO2 of 9. Pt being admitted by Dr. Posey Pronto of [facility]. Spoke to Dr. Posey Pronto about lab value and he will put in appropriate orders. RN called and told plan of care.  Clance Boll, NP [facility]

## 2013-05-13 NOTE — Progress Notes (Signed)
Critical lab called to this RN CO2-9. Oncall NP notified and new orders placed. Will continue to monitor.

## 2013-05-13 NOTE — Progress Notes (Addendum)
UR completed. Patient changed to inpatient r/t requiring IVF with KCL and IV bicarb gtt.

## 2013-05-14 DIAGNOSIS — E43 Unspecified severe protein-calorie malnutrition: Secondary | ICD-10-CM | POA: Insufficient documentation

## 2013-05-14 LAB — BASIC METABOLIC PANEL
CO2: 8 mEq/L — CL (ref 19–32)
Calcium: 8 mg/dL — ABNORMAL LOW (ref 8.4–10.5)
Chloride: 125 mEq/L — ABNORMAL HIGH (ref 96–112)
GFR calc Af Amer: 24 mL/min — ABNORMAL LOW (ref >90)
Glucose, Bld: 78 mg/dL (ref 70–99)
Sodium: 143 mEq/L (ref 135–145)

## 2013-05-14 MED ORDER — STERILE WATER FOR INJECTION IV SOLN
INTRAVENOUS | Status: DC
  Administered 2013-05-14 – 2013-05-18 (×7): via INTRAVENOUS
  Filled 2013-05-14 (×14): qty 850

## 2013-05-14 MED ORDER — ACETAMINOPHEN 325 MG PO TABS
650.0000 mg | ORAL_TABLET | Freq: Four times a day (QID) | ORAL | Status: DC | PRN
  Administered 2013-05-14: 650 mg via ORAL
  Filled 2013-05-14: qty 2

## 2013-05-14 NOTE — Progress Notes (Signed)
Subjective: Feels much better, nausea resolved, diarrhea about the same  Objective: Vital signs in last 24 hours: Temp:  [98.2 F (36.8 C)-98.5 F (36.9 C)] 98.5 F (36.9 C) (12/20 0459) Pulse Rate:  [68-79] 79 (12/20 0459) Resp:  [18] 18 (12/19 1455) BP: (100-107)/(64-67) 107/67 mmHg (12/20 0459) SpO2:  [98 %-100 %] 100 % (12/20 0459) Weight:  [51.211 kg (112 lb 14.4 oz)] 51.211 kg (112 lb 14.4 oz) (12/20 0459) Weight change: 1.86 kg (4 lb 1.6 oz) Last BM Date: 05/14/13  Intake/Output from previous day: 12/19 0701 - 12/20 0700 In: 246 [P.O.:240; I.V.:6] Out: 325 [Urine:325] Intake/Output this shift:    General appearance: alert and cooperative Resp: clear to auscultation bilaterally Cardio: regular rate and rhythm, S1, S2 normal, no murmur, click, rub or gallop GI: soft, non-tender; bowel sounds normal; no masses,  no organomegaly Extremities: extremities normal, atraumatic, no cyanosis or edema  Lab Results:  Recent Labs  05/12/13 1445 05/13/13 0630  WBC 5.9 5.2  HGB 11.7* 10.1*  HCT 35.7* 31.3*  PLT 117* 110*   BMET  Recent Labs  05/13/13 0630 05/13/13 1245 05/14/13 0431  NA 144  --  143  K 2.8* 3.4* 4.4  CL 119*  --  125*  CO2 12*  --  8*  GLUCOSE 67*  --  78  BUN 31*  --  27*  CREATININE 2.64*  --  2.35*  CALCIUM 7.1*  --  8.0*    Studies/Results: Dg Abd Acute W/chest  05/13/2013   CLINICAL DATA:  Nausea and vomiting.  Slow bowel sounds.  EXAM: ACUTE ABDOMEN SERIES (ABDOMEN 2 VIEW & CHEST 1 VIEW)  COMPARISON:  Abdominal CT 06/07/2012  FINDINGS: Hyperinflated lungs. No acute infiltrate or edema. No effusion or pneumothorax. No evidence of bowel obstruction. There is distal colonic diverticulosis. Questionable fluid levels within the distal colon, which also may have rugal fold thickening. Previous bowel surgery with sutures in the lower abdomen. Cholecystectomy. No nephrolithiasis not well demonstrated.  IMPRESSION: 1. Negative for bowel obstruction or  perforation. 2. Questionable colonic wall thickening distally, which would suggest colitis. 3. Clear chest.   Electronically Signed   By: Jorje Guild M.D.   On: 05/13/2013 00:59    Medications: I have reviewed the patient's current medications.  Assessment/Plan: Principal Problem:  Hypokalemia with metabolic acidosis likely due to GI losses  - potassium repleted - change IV to D5W with 3 amps nahco3 at 100 Active Problems:  Crohn's disease  - C. difficile negative, stool cultures better - Nausea improved, patient is on maintenance prednisone at 5 mg daily, increased it to 40 mg for now  - Advance diet to regular  Diarrhea  -?improving, continue  hydration C. difficile negative stool culture pending CKD (chronic kidney disease)  - Follows Dr. Camille Bal function improving Hypothyroidism  Continue Synthroid    LOS: 2 days   Natasha [name] 05/14/2013, 10:30 AM

## 2013-05-14 NOTE — Progress Notes (Signed)
Pt c/o lower abdominal pain 8/10.  Pt states she has this pain at home and takes extra strength tylenol or vicodin.  Bowels sounds present x 4 quads to auscultation, LBM 05/14/13, abdomen soft and not distended.  Dr. Laurann Montana notified with new orders.  Will continue to monitor.

## 2013-05-14 NOTE — Progress Notes (Signed)
CRITICAL VALUE ALERT  Critical value received:  CO2 8  Date of notification:  05/14/13  Time of notification:  0730  Critical value read back:yes  Nurse who received alert:  Estanislado Pandy  MD notified (1st page):  Dr Laurann Montana  Time of first page:  0945  MD notified (2nd page):  Time of second page:  Responding MD: Dr Laurann Montana   Time MD responded:  [phone]

## 2013-05-15 LAB — CBC
HCT: 28.8 % — ABNORMAL LOW (ref 36.0–46.0)
Hemoglobin: 9.4 g/dL — ABNORMAL LOW (ref 12.0–15.0)
MCH: 31.5 pg (ref 26.0–34.0)
MCHC: 32.6 g/dL (ref 30.0–36.0)
MCV: 96.6 fL (ref 78.0–100.0)
Platelets: 103 10*3/uL — ABNORMAL LOW (ref 150–400)
WBC: 6.8 10*3/uL (ref 4.0–10.5)

## 2013-05-15 LAB — BASIC METABOLIC PANEL
BUN: 28 mg/dL — ABNORMAL HIGH (ref 6–23)
CO2: 13 mEq/L — ABNORMAL LOW (ref 19–32)
Calcium: 8 mg/dL — ABNORMAL LOW (ref 8.4–10.5)
Chloride: 115 mEq/L — ABNORMAL HIGH (ref 96–112)
GFR calc non Af Amer: 20 mL/min — ABNORMAL LOW (ref >90)
Glucose, Bld: 72 mg/dL (ref 70–99)
Potassium: 2.8 mEq/L — ABNORMAL LOW (ref 3.5–5.1)
Sodium: 142 mEq/L (ref 135–145)

## 2013-05-15 LAB — MAGNESIUM: Magnesium: 1.2 mg/dL — ABNORMAL LOW (ref 1.5–2.5)

## 2013-05-15 MED ORDER — PREDNISONE 5 MG PO TABS
5.0000 mg | ORAL_TABLET | Freq: Every day | ORAL | Status: DC
  Administered 2013-05-16: 5 mg via ORAL
  Filled 2013-05-15 (×2): qty 1

## 2013-05-15 MED ORDER — POTASSIUM CHLORIDE ER 10 MEQ PO TBCR
20.0000 meq | EXTENDED_RELEASE_TABLET | Freq: Two times a day (BID) | ORAL | Status: DC
  Administered 2013-05-15: 20 meq via ORAL
  Filled 2013-05-15 (×3): qty 2

## 2013-05-15 MED ORDER — POTASSIUM CHLORIDE CRYS ER 20 MEQ PO TBCR
40.0000 meq | EXTENDED_RELEASE_TABLET | Freq: Once | ORAL | Status: AC
  Administered 2013-05-15: 40 meq via ORAL
  Filled 2013-05-15: qty 2

## 2013-05-15 MED ORDER — HYDROCODONE-ACETAMINOPHEN 5-325 MG PO TABS
1.0000 | ORAL_TABLET | ORAL | Status: DC | PRN
  Administered 2013-05-15 – 2013-05-17 (×6): 1 via ORAL
  Filled 2013-05-15 (×6): qty 1

## 2013-05-15 MED ORDER — MAGNESIUM OXIDE 400 (241.3 MG) MG PO TABS
400.0000 mg | ORAL_TABLET | Freq: Two times a day (BID) | ORAL | Status: DC
  Administered 2013-05-15: 400 mg via ORAL
  Filled 2013-05-15 (×3): qty 1

## 2013-05-15 MED ORDER — POTASSIUM CHLORIDE 10 MEQ/100ML IV SOLN
10.0000 meq | INTRAVENOUS | Status: AC
  Administered 2013-05-15 (×4): 10 meq via INTRAVENOUS
  Filled 2013-05-15 (×4): qty 100

## 2013-05-15 NOTE — Progress Notes (Signed)
Pt has had 3 liquid stools this shift with bleeding on toilet paper after the last 2, with increased abd pain. MD notified and new orders noted will continue to monitor, pt given pain med with relief noted.

## 2013-05-15 NOTE — Progress Notes (Addendum)
Subjective: At least 4 loose bowel movements last 24 hours. Mild nausea  Objective: Vital signs in last 24 hours: Temp:  [97.6 F (36.4 C)-98.4 F (36.9 C)] 97.6 F (36.4 C) (12/21 0522) Pulse Rate:  [60-78] 60 (12/21 0522) Resp:  [18-19] 18 (12/21 0522) BP: (113-123)/(70-72) 113/70 mmHg (12/21 0522) SpO2:  [98 %-100 %] 99 % (12/21 0522) Weight:  [54 kg (119 lb 0.8 oz)] 54 kg (119 lb 0.8 oz) (12/21 0522) Weight change: 2.789 kg (6 lb 2.4 oz) Last BM Date: 05/15/13  Intake/Output from previous day: 12/20 0701 - 12/21 0700 In: 2626.7 [P.O.:1080; I.V.:1546.7] Out: 500 [Urine:500] Intake/Output this shift:    General appearance: alert and cooperative Resp: clear to auscultation bilaterally Cardio: regular rate and rhythm, S1, S2 normal, no murmur, click, rub or gallop GI: mild LLQ tenderness  Lab Results:  Recent Labs  05/13/13 0630 05/15/13 0530  WBC 5.2 6.8  HGB 10.1* 9.4*  HCT 31.3* 28.8*  PLT 110* 103*   BMET  Recent Labs  05/14/13 0431 05/15/13 0530  NA 143 142  K 4.4 2.8*  CL 125* 115*  CO2 8* 13*  GLUCOSE 78 72  BUN 27* 28*  CREATININE 2.35* 2.36*  CALCIUM 8.0* 8.0*    Studies/Results: No results found.  Medications: I have reviewed the patient's current medications.  Assessment/Plan: Principal Problem:  Hypokalemia with metabolic acidosis likely due to GI losses  - potassium low again today, replete IV and po, recheck magnesium (as low at admission) and start some oral magnesium (given 1 dose IV) -  continue IV  D5W with 3 amps nahco3 at 100  Active Problems:  Crohn's disease  - C. difficile negative, send stool culture (salmonella in 1/14) - Nausea improved, patient is on maintenance prednisone at 5 mg daily, increased it to 40 mg for now  - Advance diet to regular  Diarrhea ? Etiology, sed rate and CRP normal doubt crohns, ?need colonoscopy with biopsy -?improving, continue hydration C. difficile negative sending stool culture CKD  (chronic kidney disease)  - Follows Dr. Camille Bal function stable  Hypothyroidism synthroid      LOS: 3 days   Brittin Janik JOSEPH 05/15/2013, 11:12 AM

## 2013-05-16 ENCOUNTER — Inpatient Hospital Stay (HOSPITAL_COMMUNITY): Payer: Medicare Other

## 2013-05-16 ENCOUNTER — Encounter (HOSPITAL_COMMUNITY): Payer: Self-pay | Admitting: Emergency Medicine

## 2013-05-16 LAB — BASIC METABOLIC PANEL
Calcium: 7.4 mg/dL — ABNORMAL LOW (ref 8.4–10.5)
Chloride: 108 mEq/L (ref 96–112)
Creatinine, Ser: 2.23 mg/dL — ABNORMAL HIGH (ref 0.50–1.10)
GFR calc Af Amer: 25 mL/min — ABNORMAL LOW (ref >90)
GFR calc non Af Amer: 22 mL/min — ABNORMAL LOW (ref >90)
Potassium: 2.9 mEq/L — ABNORMAL LOW (ref 3.5–5.1)
Sodium: 137 mEq/L (ref 135–145)

## 2013-05-16 LAB — CBC
HCT: 27.6 % — ABNORMAL LOW (ref 36.0–46.0)
Hemoglobin: 9.2 g/dL — ABNORMAL LOW (ref 12.0–15.0)
MCV: 94.5 fL (ref 78.0–100.0)
RDW: 14.7 % (ref 11.5–15.5)
WBC: 6.6 10*3/uL (ref 4.0–10.5)

## 2013-05-16 MED ORDER — POTASSIUM CHLORIDE 10 MEQ/100ML IV SOLN
10.0000 meq | INTRAVENOUS | Status: AC
  Administered 2013-05-16 (×3): 10 meq via INTRAVENOUS
  Filled 2013-05-16 (×3): qty 100

## 2013-05-16 MED ORDER — MAGNESIUM SULFATE 50 % IJ SOLN: 2.0000 g | Freq: Two times a day (BID) | INTRAVENOUS | Status: DC

## 2013-05-16 MED ORDER — POTASSIUM CHLORIDE CRYS ER 20 MEQ PO TBCR
40.0000 meq | EXTENDED_RELEASE_TABLET | Freq: Two times a day (BID) | ORAL | Status: DC
  Administered 2013-05-16 (×2): 40 meq via ORAL
  Filled 2013-05-16 (×4): qty 2

## 2013-05-16 MED ORDER — POTASSIUM CHLORIDE ER 10 MEQ PO TBCR: 40.0000 meq | EXTENDED_RELEASE_TABLET | Freq: Two times a day (BID) | ORAL | Status: DC

## 2013-05-16 MED ORDER — MAGNESIUM SULFATE 40 MG/ML IJ SOLN
2.0000 g | Freq: Two times a day (BID) | INTRAMUSCULAR | Status: AC
  Administered 2013-05-16 (×2): 2 g via INTRAVENOUS
  Filled 2013-05-16 (×2): qty 50

## 2013-05-16 MED ORDER — IOHEXOL 300 MG/ML  SOLN
25.0000 mL | INTRAMUSCULAR | Status: AC
  Administered 2013-05-16 (×2): 25 mL via ORAL

## 2013-05-16 NOTE — Progress Notes (Signed)
Magnesium oxide tablets will act like a laxative and cause diarrhea.

## 2013-05-16 NOTE — Progress Notes (Signed)
Problems: chronic nausea, vomiting, diarrhea, and unintentional weight loss. Chronic prednisone therapy.  History: The patient is a 67 year old female born 05-24-1946. She has chronic kidney disease and a very low estimated glomerular filtration rate complicated by metabolic acidosis.  The patient was admitted to the hospital to evaluate and treat chronic nausea, vomiting, diarrhea, and unintentional weight loss.  The patient has a history of Crohn's ileitis and has undergone 3 terminal ileal resections in the past. She has vitamin B12 deficiency. She underwent a normal colonoscopy in October 2011. She underwent a normal esophagogastroduodenoscopy in August 2012. She underwent a normal esophagogastroduodenoscopy and flexible proctosigmoidoscopy in October 2013.  Past medical history: Crohn's ileitis. Primary hypothyroidism. Hypertension. 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 Vitamin B12 deficiency. Pancreas divisum. Osteoporosis. Kidney stones. Glaucoma. Thyroid cyst. Postmenopausal. Three terminal ileal resections. Segmental right colon resection. Total abdominal hysterectomy without bilateral salpingo-oophorectomy. Cholecystectomy. Thyroidectomy.Chronic prednisone therapy.  Consultants. Nephrologist Dr. Fleet Contras. Urologist Dr. Nicki Reaper McDiarmid.  Immunizations: Pneumovax given in 2008. Shingles vaccination given in 2008. Tetanus booster given in 2008.  Recommendations: Stool culture pending. Screen stool for C. Difficile toxin. Schedule barium upper GI with small bowel follow-through x-ray series to rule out recurrent Crohn ileitis. CT scan of the abdomen without contrast has been scheduled for 05/16/2013. Consult Dr. 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

## 2013-05-16 NOTE — Care Management Note (Unsigned)
    Page 1 of 1   05/16/2013     4:31:26 PM   CARE MANAGEMENT NOTE 05/16/2013  Patient:  Natasha [name], Natasha [name]   Account Number:  [id number]  Date Initiated:  05/16/2013  Documentation initiated by:  GRAVES-BIGELOW,Saddie Sandeen  Subjective/Objective Assessment:   Pt admitted for hypokalemia. Repleating K and mag. Plan for home once medically stable.     Action/Plan:   CM will continue ot monitor for disposiiton needs.   Anticipated DC Date:  05/17/2013   Anticipated DC Plan:  [place]  CM consult      Choice offered to / List presented to:             Status of service:  In process, will continue to follow Medicare Important Message given?   (If response is "NO", the following Medicare IM given date fields will be blank) Date Medicare IM given:   Date Additional Medicare IM given:    Discharge Disposition:    Per UR Regulation:  Reviewed for med. necessity/level of care/duration of stay  If discussed at [place] of Stay Meetings, dates discussed:    Comments:

## 2013-05-16 NOTE — Progress Notes (Signed)
Patient ID: Natasha [name]  female  JAS:[id number]    DOB: [birth year]    DOA: 05/12/2013  PCP: Garlan Fair, MD  Assessment/Plan: Principal Problem:   Hypokalemia with hypomagnesemia : Likely due to GI losses - Placed on aggressive potassium replacement, IV magnesium replacement  Active Problems:   Crohn's disease with ileitis - CT abdomen and pelvis showed circumferential wall thickening involving the colon and very distal ileum at the level of surgical anastomosis may represent active Crohn's disease or potentially a focal colitis. - Upper GI series with small bowel follow-through showed no obstruction - I had already placed the patient on prednisone 40 mg, continue until followup outpatient with GI - C. difficile was checked on 05/13/2013 which was negative, stool cultures were negative - Followup with primary gastroenterologist, Dr Wynetta Emery  outpatient    CKD (chronic kidney disease) -Stable, continue monitoring     Hypothyroidism - Continue Synthroid    Protein-calorie malnutrition, severe Continue nutritional supplements  DVT Prophylaxis:Heparin subcutaneous  Code Status:  Family Communication:Discussed in detail with the patient at the bedside  Disposition:    Subjective: Feels somewhat better today, no abdomenal pain.  Objective: Weight change: -1.065 kg (-2 lb 5.6 oz)  Intake/Output Summary (Last 24 hours) at 05/16/13 1616 Last data filed at 05/16/13 0500  Gross per 24 hour  Intake   2200 ml  Output    250 ml  Net   1950 ml   Blood pressure 120/61, pulse 67, temperature 98 F (36.7 C), temperature source Oral, resp. rate 17, height 5' 2"  (1.575 m), weight 52.935 kg (116 lb 11.2 oz), SpO2 98.00%.  Physical Exam: General: Alert and awake, oriented x3, NAD CVS: S1-S2 clear, no murmur rubs or gallops Chest: clear to auscultation bilaterally Abdomen:  mild left lower quadrant tenderness  Extremities: no cyanosis, clubbing or edema noted  bilaterally   Lab Results: Basic Metabolic Panel:  Recent Labs Lab 05/15/13 0530 05/15/13 1840 05/16/13 0520  NA 142  --  137  K 2.8* 3.0* 2.9*  CL 115*  --  108  CO2 13*  --  20  GLUCOSE 72  --  72  BUN 28*  --  26*  CREATININE 2.36*  --  2.23*  CALCIUM 8.0*  --  7.4*  MG  --  1.2*  --    Liver Function Tests:  Recent Labs Lab 05/12/13 1445 05/13/13 0630  AST 16 14  ALT 12 10  ALKPHOS 237* 205*  BILITOT 0.4 0.4  PROT 6.8 5.5*  ALBUMIN 3.4* 2.7*    Recent Labs Lab 05/12/13 1445  LIPASE 104*   No results found for this basename: AMMONIA,  in the last 168 hours CBC:  Recent Labs Lab 05/13/13 0630 05/15/13 0530 05/16/13 0520  WBC 5.2 6.8 6.6  NEUTROABS 3.6  --   --   HGB 10.1* 9.4* 9.2*  HCT 31.3* 28.8* 27.6*  MCV 97.2 96.6 94.5  PLT 110* 103* 96*   Cardiac Enzymes: No results found for this basename: CKTOTAL, CKMB, CKMBINDEX, TROPONINI,  in the last 168 hours BNP: No components found with this basename: POCBNP,  CBG: No results found for this basename: GLUCAP,  in the last 168 hours   Micro Results: Recent Results (from the past 240 hour(s))  CLOSTRIDIUM DIFFICILE BY PCR     Status: None   Collection Time    05/13/13  5:32 AM      Result Value Range Status   C difficile by pcr NEGATIVE  NEGATIVE Final  STOOL CULTURE     Status: None   Collection Time    05/15/13  1:06 PM      Result Value Range Status   Specimen Description STOOL   Final   Special Requests Normal   Final   Culture     Final   Value: Culture reincubated for better growth     Performed at [facility]   Report Status PENDING   Incomplete    Studies/Results: Ct Abdomen Pelvis Wo Contrast  05/16/2013   CLINICAL DATA:  Patient with history of diarrhea for 3- 4 weeks. Weight loss. No IV contrast due to elevated creatinine.  EXAM: CT ABDOMEN AND PELVIS WITHOUT CONTRAST  TECHNIQUE: Multidetector CT imaging of the abdomen and pelvis was performed following the standard  protocol without intravenous contrast.  COMPARISON:  CT abdomen pelvis 06/07/2012.  FINDINGS: Limited visualization of the lung bases demonstrates minimal dependent atelectasis. Low attenuation of the blood pool compatible with anemia.  Lack of intravenous contrast material limits evaluation of the solid organ parenchyma.  Liver is normal in size and contour. Status post cholecystectomy. Mild dilation of the central intrahepatic biliary system as well as the common bile duct not significantly changed from prior examinations, and likely secondary to postcholecystectomy state. The spleen, pancreas and bilateral adrenal glands are unremarkable. Findings suggestive of pancreatic divisum. Posterior gastric diverticulum.  No hydronephrosis. Re- demonstrated bilateral 2-3 mm nonobstructing stones. Unchanged exophytic simple cyst off the inferior pole of the left kidney measuring 1.8 cm. Urinary bladder is grossly unremarkable.  Normal caliber abdominal aorta with scattered calcified atherosclerotic plaque. No enlarged retroperitoneal lymphadenopathy.  Postsurgical changes compatible with right hemicolectomy. There is circumferential wall thickening of the colon at the level of the anastomosis. Additionally there is suggestion of short segment of wall thickening of the neo terminal ileum at the level of the surgical anastomosis. Few gaseous distended loops of small bowel particularly within the right lower quadrant measuring up to 3.5 cm. no free fluid or free intraperitoneal air. No significant interval change in a few adjacent subcentimeter mesenteric lymph nodes.  Lower lumbar spine degenerative change. No aggressive osseous lesions.  IMPRESSION: 1. Area of circumferential wall thickening involving the colon and very distal ileum at the level of the surgical anastomosis within the central abdomen. Findings may represent active Crohn's disease or potentially a focal colitis. Oral contrast material passes to the level of  the rectum however there is mild gaseous distention of a few proximal small bowel loops suggestive of ileus. 2. Nonobstructing nephrolithiasis. These results will be called to the ordering clinician or representative by the Radiologist Assistant, and communication documented in the PACS Dashboard.   Electronically Signed   By: Lovey Newcomer M.D.   On: 05/16/2013 07:58   Dg Abd Acute W/chest  05/13/2013   CLINICAL DATA:  Nausea and vomiting.  Slow bowel sounds.  EXAM: ACUTE ABDOMEN SERIES (ABDOMEN 2 VIEW & CHEST 1 VIEW)  COMPARISON:  Abdominal CT 06/07/2012  FINDINGS: Hyperinflated lungs. No acute infiltrate or edema. No effusion or pneumothorax. No evidence of bowel obstruction. There is distal colonic diverticulosis. Questionable fluid levels within the distal colon, which also may have rugal fold thickening. Previous bowel surgery with sutures in the lower abdomen. Cholecystectomy. No nephrolithiasis not well demonstrated.  IMPRESSION: 1. Negative for bowel obstruction or perforation. 2. Questionable colonic wall thickening distally, which would suggest colitis. 3. Clear chest.   Electronically Signed   By: Gilford Silvius.D.  On: 05/13/2013 00:59   Dg Ugi W/small Bowel  05/16/2013   CLINICAL DATA:  Crohn disease.  EXAM: UPPER GI SERIES WITH SMALL BOWEL FOLLOW-THROUGH  TECHNIQUE: Combined double contrast and single contrast upper GI series using effervescent crystals, thick barium, and thin barium. Subsequently, serial images of the small bowel were obtained including spot views of the terminal ileum.  COMPARISON:  CT scan from earlier today  FLUOROSCOPY TIME:  5 min and 11 seconds  FINDINGS: Limited single contrast exam performed secondary to patient's relative immobility.  Oblique single contrast views of the esophagus while swallowing are unremarkable, without evidence for diverticulum, gross stricture, or mass lesion. Stomach is not markedly distended, but is in the expected anatomic location. There  is brisk emptying through a normal pylorus. Duodenal bulb is unremarkable. Duodenal C-loop is normally positioned.  Barium reaches the colon by 30 min after completion of the upper GI series. There is no gross small bowel dilatation. Distal enteric barium be comes loculated. As seen on the CT scan, there is some wall thickening in the proximal colon, associated with the ileocolic anastomosis. There is also some wall thickening in the neo terminal ileum heading into the anastomosis.  No evidence for an overt small bowel stricture. There is no stricture at the enterocolic anastomosis.  IMPRESSION: No obstruction to flow of barium into the colon.   Electronically Signed   By: Misty Stanley M.D.   On: 05/16/2013 16:04    Medications: Scheduled Meds: . calcitRIOL  0.5 mcg Oral Daily  . feeding supplement (RESOURCE BREEZE)  1 Container Oral BID BM  . folic acid  1 mg Oral Daily  . heparin  5,000 Units Subcutaneous Q8H  . lactose free nutrition  237 mL Oral BID BM  . levothyroxine  125 mcg Oral QAC breakfast  . magnesium sulfate 1 - 4 g bolus IVPB  2 g Intravenous BID  . potassium chloride  40 mEq Oral BID  . predniSONE  40 mg Oral Daily  . sodium chloride  3 mL Intravenous Q12H      LOS: 4 days   RAI,RIPUDEEP M.D. [facility] 05/16/2013, 4:16 PM Pager: [phone]  If 7PM-7AM, please contact night-coverage [email or web address] Password [email or web address]

## 2013-05-16 NOTE — Progress Notes (Signed)
Problem: Crohn's ileitis. CT scan of the abdomen and pelvis without contrast showed intestinal wall thickening involving the neoterminal ileum and right colon at the level of her anastomosis suggesting recurrence in her Crohn's ileitis.  I recommend increasing her oral dose of prednisone to 40 mg each morning. Screen her stool for C. Difficile toxin. Schedule a barium upper GI with small bowel follow-through x-ray series if oral barium contrast is available.  Discharge the patient on oral prednisone 40 mg each morning. She should remain on that dose of prednisone until I see her back in the office approximately 2 weeks post discharge.

## 2013-05-17 LAB — BASIC METABOLIC PANEL
CO2: 26 mEq/L (ref 19–32)
Calcium: 7.5 mg/dL — ABNORMAL LOW (ref 8.4–10.5)
GFR calc non Af Amer: 22 mL/min — ABNORMAL LOW (ref >90)
Glucose, Bld: 61 mg/dL — ABNORMAL LOW (ref 70–99)
Potassium: 3.7 mEq/L (ref 3.5–5.1)
Sodium: 144 mEq/L (ref 135–145)

## 2013-05-17 LAB — OVA AND PARASITE EXAMINATION

## 2013-05-17 LAB — MAGNESIUM: Magnesium: 2.7 mg/dL — ABNORMAL HIGH (ref 1.5–2.5)

## 2013-05-17 LAB — CLOSTRIDIUM DIFFICILE BY PCR: Toxigenic C. Difficile by PCR: NEGATIVE

## 2013-05-17 MED ORDER — POTASSIUM CHLORIDE CRYS ER 20 MEQ PO TBCR
40.0000 meq | EXTENDED_RELEASE_TABLET | Freq: Every day | ORAL | Status: DC
  Administered 2013-05-17 – 2013-05-18 (×2): 40 meq via ORAL
  Filled 2013-05-17: qty 2

## 2013-05-17 NOTE — Progress Notes (Signed)
Patient ID: Natasha [name]  female  PJS:[id number]    DOB: 10-24-1945    DOA: 05/12/2013  PCP: Garlan Fair, MD  Assessment/Plan: Principal Problem:   Hypokalemia with hypomagnesemia : Likely due to GI losses: Resolved - Potassium replacements decrease to daily as patient is still having diarrhea (she states that stools have started forming, x5 for last 24 hours per patient)  Active Problems:   Crohn's disease with ileitis - CT abdomen and pelvis showed circumferential wall thickening involving the colon and very distal ileum at the level of surgical anastomosis may represent active Crohn's disease or potentially a focal colitis. - Upper GI series with small bowel follow-through showed no obstruction -  on prednisone 40 mg, continue until followup outpatient with GI - C. difficile was checked on 05/13/2013 which was negative, stool cultures were negative - Followup with primary gastroenterologist, Dr Wynetta Emery  outpatient    CKD (chronic kidney disease) -Stable, continue monitoring     Hypothyroidism - Continue Synthroid    Protein-calorie malnutrition, severe Continue nutritional supplements  DVT Prophylaxis:Heparin subcutaneous  Code Status:  Family Communication:Discussed in detail with the patient at the bedside  Disposition: Likely 24-48hrs   Subjective: Still having diarrhea however patient reports that stools have started forming, still x5 times in last 24hrs  Objective: Weight change: 0.165 kg (5.8 oz)  Intake/Output Summary (Last 24 hours) at 05/17/13 1237 Last data filed at 05/17/13 0000  Gross per 24 hour  Intake   2540 ml  Output      0 ml  Net   2540 ml   Blood pressure 106/64, pulse 66, temperature 97.9 F (36.6 C), temperature source Oral, resp. rate 18, height 5' 2"  (1.575 m), weight 53.1 kg (117 lb 1 oz), SpO2 97.00%.  Physical Exam: General: Alert and awake, oriented x3, NAD CVS: S1-S2 clear, no murmur rubs or gallops Chest: clear to  auscultation bilaterally Abdomen:  mild left lower quadrant tenderness  Extremities: no c/c/e bilaterally   Lab Results: Basic Metabolic Panel:  Recent Labs Lab 05/16/13 0520 05/17/13 0450  NA 137 144  K 2.9* 3.7  CL 108 109  CO2 20 26  GLUCOSE 72 61*  BUN 26* 24*  CREATININE 2.23* 2.18*  CALCIUM 7.4* 7.5*  MG  --  2.7*   Liver Function Tests:  Recent Labs Lab 05/12/13 1445 05/13/13 0630  AST 16 14  ALT 12 10  ALKPHOS 237* 205*  BILITOT 0.4 0.4  PROT 6.8 5.5*  ALBUMIN 3.4* 2.7*    Recent Labs Lab 05/12/13 1445  LIPASE 104*   No results found for this basename: AMMONIA,  in the last 168 hours CBC:  Recent Labs Lab 05/13/13 0630 05/15/13 0530 05/16/13 0520  WBC 5.2 6.8 6.6  NEUTROABS 3.6  --   --   HGB 10.1* 9.4* 9.2*  HCT 31.3* 28.8* 27.6*  MCV 97.2 96.6 94.5  PLT 110* 103* 96*   Cardiac Enzymes: No results found for this basename: CKTOTAL, CKMB, CKMBINDEX, TROPONINI,  in the last 168 hours BNP: No components found with this basename: POCBNP,  CBG: No results found for this basename: GLUCAP,  in the last 168 hours   Micro Results: Recent Results (from the past 240 hour(s))  CLOSTRIDIUM DIFFICILE BY PCR     Status: None   Collection Time    05/13/13  5:32 AM      Result Value Range Status   C difficile by pcr NEGATIVE  NEGATIVE Final  STOOL CULTURE  Status: None   Collection Time    05/15/13  1:06 PM      Result Value Range Status   Specimen Description STOOL   Final   Special Requests Normal   Final   Culture     Final   Value: NO SUSPICIOUS COLONIES, CONTINUING TO HOLD     Performed at [facility]   Report Status PENDING   Incomplete  CLOSTRIDIUM DIFFICILE BY PCR     Status: None   Collection Time    05/16/13 11:56 PM      Result Value Range Status   C difficile by pcr NEGATIVE  NEGATIVE Final    Studies/Results: Ct Abdomen Pelvis Wo Contrast  05/16/2013   CLINICAL DATA:  Patient with history of diarrhea for 3- 4  weeks. Weight loss. No IV contrast due to elevated creatinine.  EXAM: CT ABDOMEN AND PELVIS WITHOUT CONTRAST  TECHNIQUE: Multidetector CT imaging of the abdomen and pelvis was performed following the standard protocol without intravenous contrast.  COMPARISON:  CT abdomen pelvis 06/07/2012.  FINDINGS: Limited visualization of the lung bases demonstrates minimal dependent atelectasis. Low attenuation of the blood pool compatible with anemia.  Lack of intravenous contrast material limits evaluation of the solid organ parenchyma.  Liver is normal in size and contour. Status post cholecystectomy. Mild dilation of the central intrahepatic biliary system as well as the common bile duct not significantly changed from prior examinations, and likely secondary to postcholecystectomy state. The spleen, pancreas and bilateral adrenal glands are unremarkable. Findings suggestive of pancreatic divisum. Posterior gastric diverticulum.  No hydronephrosis. Re- demonstrated bilateral 2-3 mm nonobstructing stones. Unchanged exophytic simple cyst off the inferior pole of the left kidney measuring 1.8 cm. Urinary bladder is grossly unremarkable.  Normal caliber abdominal aorta with scattered calcified atherosclerotic plaque. No enlarged retroperitoneal lymphadenopathy.  Postsurgical changes compatible with right hemicolectomy. There is circumferential wall thickening of the colon at the level of the anastomosis. Additionally there is suggestion of short segment of wall thickening of the neo terminal ileum at the level of the surgical anastomosis. Few gaseous distended loops of small bowel particularly within the right lower quadrant measuring up to 3.5 cm. no free fluid or free intraperitoneal air. No significant interval change in a few adjacent subcentimeter mesenteric lymph nodes.  Lower lumbar spine degenerative change. No aggressive osseous lesions.  IMPRESSION: 1. Area of circumferential wall thickening involving the colon and  very distal ileum at the level of the surgical anastomosis within the central abdomen. Findings may represent active Crohn's disease or potentially a focal colitis. Oral contrast material passes to the level of the rectum however there is mild gaseous distention of a few proximal small bowel loops suggestive of ileus. 2. Nonobstructing nephrolithiasis. These results will be called to the ordering clinician or representative by the Radiologist Assistant, and communication documented in the PACS Dashboard.   Electronically Signed   By: Lovey Newcomer M.D.   On: 05/16/2013 07:58   Dg Abd Acute W/chest  05/13/2013   CLINICAL DATA:  Nausea and vomiting.  Slow bowel sounds.  EXAM: ACUTE ABDOMEN SERIES (ABDOMEN 2 VIEW & CHEST 1 VIEW)  COMPARISON:  Abdominal CT 06/07/2012  FINDINGS: Hyperinflated lungs. No acute infiltrate or edema. No effusion or pneumothorax. No evidence of bowel obstruction. There is distal colonic diverticulosis. Questionable fluid levels within the distal colon, which also may have rugal fold thickening. Previous bowel surgery with sutures in the lower abdomen. Cholecystectomy. No nephrolithiasis not well demonstrated.  IMPRESSION: 1. Negative for bowel obstruction or perforation. 2. Questionable colonic wall thickening distally, which would suggest colitis. 3. Clear chest.   Electronically Signed   By: Jorje Guild M.D.   On: 05/13/2013 00:59   Dg Ugi W/small Bowel  05/16/2013   CLINICAL DATA:  Crohn disease.  EXAM: UPPER GI SERIES WITH SMALL BOWEL FOLLOW-THROUGH  TECHNIQUE: Combined double contrast and single contrast upper GI series using effervescent crystals, thick barium, and thin barium. Subsequently, serial images of the small bowel were obtained including spot views of the terminal ileum.  COMPARISON:  CT scan from earlier today  FLUOROSCOPY TIME:  5 min and 11 seconds  FINDINGS: Limited single contrast exam performed secondary to patient's relative immobility.  Oblique single contrast  views of the esophagus while swallowing are unremarkable, without evidence for diverticulum, gross stricture, or mass lesion. Stomach is not markedly distended, but is in the expected anatomic location. There is brisk emptying through a normal pylorus. Duodenal bulb is unremarkable. Duodenal C-loop is normally positioned.  Barium reaches the colon by 30 min after completion of the upper GI series. There is no gross small bowel dilatation. Distal enteric barium be comes loculated. As seen on the CT scan, there is some wall thickening in the proximal colon, associated with the ileocolic anastomosis. There is also some wall thickening in the neo terminal ileum heading into the anastomosis.  No evidence for an overt small bowel stricture. There is no stricture at the enterocolic anastomosis.  IMPRESSION: No obstruction to flow of barium into the colon.   Electronically Signed   By: Misty Stanley M.D.   On: 05/16/2013 16:04    Medications: Scheduled Meds: . calcitRIOL  0.5 mcg Oral Daily  . feeding supplement (RESOURCE BREEZE)  1 Container Oral BID BM  . folic acid  1 mg Oral Daily  . heparin  5,000 Units Subcutaneous Q8H  . lactose free nutrition  237 mL Oral BID BM  . levothyroxine  125 mcg Oral QAC breakfast  . potassium chloride  40 mEq Oral Daily  . predniSONE  40 mg Oral Daily  . sodium chloride  3 mL Intravenous Q12H      LOS: 5 days   RAI,RIPUDEEP M.D. [facility] 05/17/2013, 12:37 PM Pager: [phone]  If 7PM-7AM, please contact night-coverage [email or web address] Password [email or web address]

## 2013-05-18 LAB — BASIC METABOLIC PANEL
CO2: 33 mEq/L — ABNORMAL HIGH (ref 19–32)
Chloride: 105 mEq/L (ref 96–112)
Potassium: 4 mEq/L (ref 3.5–5.1)
Sodium: 144 mEq/L (ref 135–145)

## 2013-05-18 MED ORDER — BOOST PLUS PO LIQD: 237.0000 mL | Freq: Two times a day (BID) | ORAL | Status: DC

## 2013-05-18 MED ORDER — PREDNISONE 20 MG PO TABS: 40.0000 mg | ORAL_TABLET | Freq: Every day | ORAL | Status: DC

## 2013-05-18 NOTE — Discharge Summary (Signed)
Physician Discharge Summary  Natasha [name] NWG:[id number] DOB: [birth year] DOA: 05/12/2013  PCP: Garlan Fair, MD  Admit date: 05/12/2013 Discharge date: 05/18/2013  Time spent: 35 minutes  Recommendations for Outpatient Follow-up:  1. Follow up with primary gastroenterologist.  2. Need B-met to follow K level.   Discharge Diagnoses:    Hypokalemia   Crohn's disease exacerbation.    Diarrhea   CKD (chronic kidney disease)   Hypothyroidism   Protein-calorie malnutrition, severe   Discharge Condition: Stable.   Diet recommendation: Heart Healthy  Filed Weights   05/16/13 0559 05/17/13 0523 05/18/13 0612  Weight: 52.935 kg (116 lb 11.2 oz) 53.1 kg (117 lb 1 oz) 53.706 kg (118 lb 6.4 oz)    History of present illness:  Natasha [name] is a 67 y.o. female with Past medical history of Crohn's disease, GERD, CK-MB, hypertension, hypothyroidism.  The patient is coming from home.  The patient presents with complaints of on and off nausea and vomiting associated with diarrhea.  She mentions that since September She has been having the symptoms on and off with intermittent abdominal pain.  She has history of chronic kidney disease and she mentions that she has been placed on different medications for her kidney disease by her nephrologist and she thinks the medication that she got in September the name of which she does not remember worsen her symptoms from GI perspective.  She started having more nausea and vomiting. The vomiting is on a daily basis once or twice a day without any blood. She has abdominal pain located in the right side as well as in the suprapubic area which is on and off intermittent, for slight crampy.  She complains of diarrhea, she has history of Crohn's disease and has been on prednisone for long, she mentions 4-5 episodes of loose watery bowel movements without any blood.  She denies any burning urination.  She mentions she might have lost weight over 20  pounds in the last few weeks. She denies any dizziness, chest pain, shortness of breath, fever. She feels that she has some chills.  She is compliant with her medications.  Currently her only complaint is lethargy.   Hospital Course:  Hypokalemia with hypomagnesemia : Likely due to GI losses: Resolved  - Potassium replacements on hold at discharge, patient with no BM today. I have advised patient to resume her daily dose of potassium if she start having multiple BM. Her K today is at 4, she has chronic renal failure.   Crohn's disease with ileitis  - CT abdomen and pelvis showed circumferential wall thickening involving the colon and very distal ileum at the level of surgical anastomosis may represent active Crohn's disease or potentially a focal colitis.  - Upper GI series with small bowel follow-through showed no obstruction  - on prednisone 40 mg, continue until followup outpatient with GI  - C. difficile was checked on 05/13/2013 which was negative, stool cultures were negative  - Followup with primary gastroenterologist, Dr Wynetta Emery outpatient  -symptoms improved without antibiotics.  -Patient denies abdominal pain or diarrhea today. She wants to go home.   CKD (chronic kidney disease)  -Stable, continue monitoring.  Hypothyroidism  - Continue Synthroid.  Protein-calorie malnutrition, severe  Continue nutritional supplements.    Procedures:  none  Consultations:  None.   Discharge Exam: Filed Vitals:   05/18/13 0612  BP: 130/62  Pulse: 70  Temp: 98.9 F (37.2 C)  Resp:     General: No distress.  Cardiovascular: S 1, S 2 RRR Respiratory: CTA  Discharge Instructions  Discharge Orders   Future Orders Complete By Expires   Diet - low sodium heart healthy  As directed    Increase activity slowly  As directed        Medication List    STOP taking these medications       potassium chloride 10 MEQ tablet  Commonly known as:  K-DUR      TAKE these  medications       acetaminophen 500 MG tablet  Commonly known as:  TYLENOL  Take 500 mg by mouth every 6 (six) hours as needed. For severe pain     ACTONEL 35 MG tablet  Generic drug:  risedronate  Take 35 mg by mouth every 7 (seven) days. with water on empty stomach, nothing by mouth or lie down for next 30 minutes. Takes on Monday     calcitRIOL 0.5 MCG capsule  Commonly known as:  ROCALTROL  Take 0.5 mcg by mouth daily.     fluticasone 50 MCG/ACT nasal spray  Commonly known as:  FLONASE  Place 2 sprays into both nostrils daily as needed for allergies or rhinitis.     folic acid 1 MG tablet  Commonly known as:  FOLVITE  Take 1 mg by mouth daily.     guaiFENesin 600 MG 12 hr tablet  Commonly known as:  MUCINEX  Take 600 mg by mouth 2 (two) times daily as needed for cough or to loosen phlegm.     lactose free nutrition Liqd  Take 237 mLs by mouth 2 (two) times daily between meals.     levothyroxine 125 MCG tablet  Commonly known as:  SYNTHROID, LEVOTHROID  Take 125 mcg by mouth daily.     predniSONE 20 MG tablet  Commonly known as:  DELTASONE  Take 2 tablets (40 mg total) by mouth daily.     vitamin B-12 1000 MCG tablet  Commonly known as:  CYANOCOBALAMIN  Take 1,000 mcg by mouth daily.     cyanocobalamin 1000 MCG/ML injection  Commonly known as:  (VITAMIN B-12)  Inject 1,000 mcg into the muscle every 30 (thirty) days. Last injection was 04/28/12       Allergies  Allergen Reactions  . Mercaptopurine Other (See Comments)    Unknown reaction  . Remicade [Infliximab] Other (See Comments)    Unknown reaction       Follow-up Information   Follow up with Garlan Fair, MD. (next week. )    Specialty:  Gastroenterology   Contact information:   [address], [place] [id number] [phone]        The results of significant diagnostics from this hospitalization (including imaging, microbiology, ancillary and laboratory) are listed below  for reference.    Significant Diagnostic Studies: Ct Abdomen Pelvis Wo Contrast  05/16/2013   CLINICAL DATA:  Patient with history of diarrhea for 3- 4 weeks. Weight loss. No IV contrast due to elevated creatinine.  EXAM: CT ABDOMEN AND PELVIS WITHOUT CONTRAST  TECHNIQUE: Multidetector CT imaging of the abdomen and pelvis was performed following the standard protocol without intravenous contrast.  COMPARISON:  CT abdomen pelvis 06/07/2012.  FINDINGS: Limited visualization of the lung bases demonstrates minimal dependent atelectasis. Low attenuation of the blood pool compatible with anemia.  Lack of intravenous contrast material limits evaluation of the solid organ parenchyma.  Liver is normal in size and contour. Status post cholecystectomy. Mild dilation of the central  intrahepatic biliary system as well as the common bile duct not significantly changed from prior examinations, and likely secondary to postcholecystectomy state. The spleen, pancreas and bilateral adrenal glands are unremarkable. Findings suggestive of pancreatic divisum. Posterior gastric diverticulum.  No hydronephrosis. Re- demonstrated bilateral 2-3 mm nonobstructing stones. Unchanged exophytic simple cyst off the inferior pole of the left kidney measuring 1.8 cm. Urinary bladder is grossly unremarkable.  Normal caliber abdominal aorta with scattered calcified atherosclerotic plaque. No enlarged retroperitoneal lymphadenopathy.  Postsurgical changes compatible with right hemicolectomy. There is circumferential wall thickening of the colon at the level of the anastomosis. Additionally there is suggestion of short segment of wall thickening of the neo terminal ileum at the level of the surgical anastomosis. Few gaseous distended loops of small bowel particularly within the right lower quadrant measuring up to 3.5 cm. no free fluid or free intraperitoneal air. No significant interval change in a few adjacent subcentimeter mesenteric lymph  nodes.  Lower lumbar spine degenerative change. No aggressive osseous lesions.  IMPRESSION: 1. Area of circumferential wall thickening involving the colon and very distal ileum at the level of the surgical anastomosis within the central abdomen. Findings may represent active Crohn's disease or potentially a focal colitis. Oral contrast material passes to the level of the rectum however there is mild gaseous distention of a few proximal small bowel loops suggestive of ileus. 2. Nonobstructing nephrolithiasis. These results will be called to the ordering clinician or representative by the Radiologist Assistant, and communication documented in the PACS Dashboard.   Electronically Signed   By: Lovey Newcomer M.D.   On: 05/16/2013 07:58   Dg Abd Acute W/chest  05/13/2013   CLINICAL DATA:  Nausea and vomiting.  Slow bowel sounds.  EXAM: ACUTE ABDOMEN SERIES (ABDOMEN 2 VIEW & CHEST 1 VIEW)  COMPARISON:  Abdominal CT 06/07/2012  FINDINGS: Hyperinflated lungs. No acute infiltrate or edema. No effusion or pneumothorax. No evidence of bowel obstruction. There is distal colonic diverticulosis. Questionable fluid levels within the distal colon, which also may have rugal fold thickening. Previous bowel surgery with sutures in the lower abdomen. Cholecystectomy. No nephrolithiasis not well demonstrated.  IMPRESSION: 1. Negative for bowel obstruction or perforation. 2. Questionable colonic wall thickening distally, which would suggest colitis. 3. Clear chest.   Electronically Signed   By: Jorje Guild M.D.   On: 05/13/2013 00:59   Dg Ugi W/small Bowel  05/16/2013   CLINICAL DATA:  Crohn disease.  EXAM: UPPER GI SERIES WITH SMALL BOWEL FOLLOW-THROUGH  TECHNIQUE: Combined double contrast and single contrast upper GI series using effervescent crystals, thick barium, and thin barium. Subsequently, serial images of the small bowel were obtained including spot views of the terminal ileum.  COMPARISON:  CT scan from earlier today   FLUOROSCOPY TIME:  5 min and 11 seconds  FINDINGS: Limited single contrast exam performed secondary to patient's relative immobility.  Oblique single contrast views of the esophagus while swallowing are unremarkable, without evidence for diverticulum, gross stricture, or mass lesion. Stomach is not markedly distended, but is in the expected anatomic location. There is brisk emptying through a normal pylorus. Duodenal bulb is unremarkable. Duodenal C-loop is normally positioned.  Barium reaches the colon by 30 min after completion of the upper GI series. There is no gross small bowel dilatation. Distal enteric barium be comes loculated. As seen on the CT scan, there is some wall thickening in the proximal colon, associated with the ileocolic anastomosis. There is also some wall thickening in  the neo terminal ileum heading into the anastomosis.  No evidence for an overt small bowel stricture. There is no stricture at the enterocolic anastomosis.  IMPRESSION: No obstruction to flow of barium into the colon.   Electronically Signed   By: Misty Stanley M.D.   On: 05/16/2013 16:04    Microbiology: Recent Results (from the past 240 hour(s))  CLOSTRIDIUM DIFFICILE BY PCR     Status: None   Collection Time    05/13/13  5:32 AM      Result Value Range Status   C difficile by pcr NEGATIVE  NEGATIVE Final  OVA AND PARASITE EXAMINATION     Status: None   Collection Time    05/15/13  3:33 AM      Result Value Range Status   Specimen Description STOOL   Final   Special Requests NONE   Final   Ova and parasites     Final   Value: NO OVA OR PARASITES SEEN MODERATE YEAST     Performed at [facility]   Report Status 05/17/2013 FINAL   Final  STOOL CULTURE     Status: None   Collection Time    05/15/13  1:06 PM      Result Value Range Status   Specimen Description STOOL   Final   Special Requests Normal   Final   Culture     Final   Value: NO SUSPICIOUS COLONIES, CONTINUING TO HOLD     Performed at  [facility]   Report Status PENDING   Incomplete  CLOSTRIDIUM DIFFICILE BY PCR     Status: None   Collection Time    05/16/13 11:56 PM      Result Value Range Status   C difficile by pcr NEGATIVE  NEGATIVE Final     Labs: Basic Metabolic Panel:  Recent Labs Lab 05/12/13 2245 05/13/13 0630  05/14/13 0431 05/15/13 0530 05/15/13 1840 05/16/13 0520 05/17/13 0450 05/18/13 0458  NA 144 144  --  143 142  --  137 144 144  K 2.9* 2.8*  < > 4.4 2.8* 3.0* 2.9* 3.7 4.0  CL 123* 119*  --  125* 115*  --  108 109 105  CO2 9* 12*  --  8* 13*  --  20 26 33*  GLUCOSE 69* 67*  --  78 72  --  72 61* 73  BUN 34* 31*  --  27* 28*  --  26* 24* 20  CREATININE 2.65* 2.64*  --  2.35* 2.36*  --  2.23* 2.18* 2.11*  CALCIUM 7.1* 7.1*  --  8.0* 8.0*  --  7.4* 7.5* 7.6*  MG 1.0* 1.8  --   --   --  1.2*  --  2.7*  --   < > = values in this interval not displayed. Liver Function Tests:  Recent Labs Lab 05/12/13 1445 05/13/13 0630  AST 16 14  ALT 12 10  ALKPHOS 237* 205*  BILITOT 0.4 0.4  PROT 6.8 5.5*  ALBUMIN 3.4* 2.7*    Recent Labs Lab 05/12/13 1445  LIPASE 104*   No results found for this basename: AMMONIA,  in the last 168 hours CBC:  Recent Labs Lab 05/12/13 1445 05/13/13 0630 05/15/13 0530 05/16/13 0520  WBC 5.9 5.2 6.8 6.6  NEUTROABS 3.0 3.6  --   --   HGB 11.7* 10.1* 9.4* 9.2*  HCT 35.7* 31.3* 28.8* 27.6*  MCV 97.3 97.2 96.6 94.5  PLT 117* 110* 103*  96*   Cardiac Enzymes: No results found for this basename: CKTOTAL, CKMB, CKMBINDEX, TROPONINI,  in the last 168 hours BNP: BNP (last 3 results) No results found for this basename: PROBNP,  in the last 8760 hours CBG: No results found for this basename: GLUCAP,  in the last 168 hours     Signed:  Neko Boyajian  [facility] 05/18/2013, 11:56 AM

## 2013-05-19 LAB — STOOL CULTURE: Special Requests: NORMAL

## 2013-07-06 ENCOUNTER — Ambulatory Visit
Admission: RE | Admit: 2013-07-06 | Discharge: 2013-07-06 | Disposition: A | Discharge status: Home / Self Care | Payer: Medicare Other | Source: Ambulatory Visit | Attending: Internal Medicine | Admitting: Internal Medicine

## 2013-07-06 ENCOUNTER — Other Ambulatory Visit: Payer: Self-pay | Admitting: Internal Medicine

## 2013-07-06 DIAGNOSIS — R0609 Other forms of dyspnea: Principal | ICD-10-CM

## 2013-07-11 ENCOUNTER — Other Ambulatory Visit (HOSPITAL_COMMUNITY): Payer: Self-pay | Admitting: Radiology

## 2013-07-11 ENCOUNTER — Encounter: Payer: Self-pay | Admitting: Cardiovascular Disease

## 2013-07-11 ENCOUNTER — Ambulatory Visit (HOSPITAL_COMMUNITY): Payer: Medicare Other | Attending: Cardiovascular Disease | Admitting: Radiology

## 2013-07-11 DIAGNOSIS — N189 Chronic kidney disease, unspecified: Secondary | ICD-10-CM | POA: Insufficient documentation

## 2013-07-11 DIAGNOSIS — R011 Cardiac murmur, unspecified: Secondary | ICD-10-CM | POA: Insufficient documentation

## 2013-07-11 DIAGNOSIS — J438 Other emphysema: Secondary | ICD-10-CM | POA: Insufficient documentation

## 2013-07-11 DIAGNOSIS — I079 Rheumatic tricuspid valve disease, unspecified: Secondary | ICD-10-CM | POA: Insufficient documentation

## 2013-07-11 DIAGNOSIS — R0609 Other forms of dyspnea: Secondary | ICD-10-CM | POA: Insufficient documentation

## 2013-07-11 DIAGNOSIS — I359 Nonrheumatic aortic valve disorder, unspecified: Secondary | ICD-10-CM | POA: Insufficient documentation

## 2013-07-11 DIAGNOSIS — R0989 Other specified symptoms and signs involving the circulatory and respiratory systems: Principal | ICD-10-CM | POA: Insufficient documentation

## 2013-07-11 DIAGNOSIS — R609 Edema, unspecified: Secondary | ICD-10-CM | POA: Insufficient documentation

## 2013-07-11 DIAGNOSIS — R0600 Dyspnea, unspecified: Secondary | ICD-10-CM

## 2013-07-11 DIAGNOSIS — I059 Rheumatic mitral valve disease, unspecified: Secondary | ICD-10-CM | POA: Insufficient documentation

## 2013-07-11 HISTORY — PX: TRANSTHORACIC ECHOCARDIOGRAM: SHX275

## 2013-07-11 NOTE — Progress Notes (Signed)
Echocardiogram performed.  

## 2013-07-16 ENCOUNTER — Emergency Department (HOSPITAL_COMMUNITY)
Admission: EM | Admit: 2013-07-16 | Discharge: 2013-07-16 | Disposition: A | Discharge status: Home / Self Care | Payer: Medicare Other | Attending: Emergency Medicine | Admitting: Emergency Medicine

## 2013-07-16 ENCOUNTER — Encounter (HOSPITAL_COMMUNITY): Payer: Self-pay | Admitting: Emergency Medicine

## 2013-07-16 ENCOUNTER — Emergency Department (HOSPITAL_COMMUNITY): Payer: Medicare Other

## 2013-07-16 DIAGNOSIS — Z79899 Other long term (current) drug therapy: Secondary | ICD-10-CM | POA: Insufficient documentation

## 2013-07-16 DIAGNOSIS — S99929A Unspecified injury of unspecified foot, initial encounter: Principal | ICD-10-CM

## 2013-07-16 DIAGNOSIS — E039 Hypothyroidism, unspecified: Secondary | ICD-10-CM | POA: Insufficient documentation

## 2013-07-16 DIAGNOSIS — IMO0002 Reserved for concepts with insufficient information to code with codable children: Secondary | ICD-10-CM | POA: Insufficient documentation

## 2013-07-16 DIAGNOSIS — M129 Arthropathy, unspecified: Secondary | ICD-10-CM | POA: Insufficient documentation

## 2013-07-16 DIAGNOSIS — S8990XA Unspecified injury of unspecified lower leg, initial encounter: Secondary | ICD-10-CM | POA: Insufficient documentation

## 2013-07-16 DIAGNOSIS — S99919A Unspecified injury of unspecified ankle, initial encounter: Principal | ICD-10-CM

## 2013-07-16 DIAGNOSIS — N189 Chronic kidney disease, unspecified: Secondary | ICD-10-CM | POA: Insufficient documentation

## 2013-07-16 DIAGNOSIS — Z8719 Personal history of other diseases of the digestive system: Secondary | ICD-10-CM | POA: Insufficient documentation

## 2013-07-16 DIAGNOSIS — Y92009 Unspecified place in unspecified non-institutional (private) residence as the place of occurrence of the external cause: Secondary | ICD-10-CM | POA: Insufficient documentation

## 2013-07-16 DIAGNOSIS — S8992XA Unspecified injury of left lower leg, initial encounter: Secondary | ICD-10-CM

## 2013-07-16 DIAGNOSIS — W010XXA Fall on same level from slipping, tripping and stumbling without subsequent striking against object, initial encounter: Secondary | ICD-10-CM | POA: Insufficient documentation

## 2013-07-16 DIAGNOSIS — W009XXA Unspecified fall due to ice and snow, initial encounter: Secondary | ICD-10-CM

## 2013-07-16 DIAGNOSIS — M25562 Pain in left knee: Secondary | ICD-10-CM

## 2013-07-16 DIAGNOSIS — X500XXA Overexertion from strenuous movement or load, initial encounter: Secondary | ICD-10-CM | POA: Insufficient documentation

## 2013-07-16 DIAGNOSIS — F17200 Nicotine dependence, unspecified, uncomplicated: Secondary | ICD-10-CM | POA: Insufficient documentation

## 2013-07-16 DIAGNOSIS — Y9389 Activity, other specified: Secondary | ICD-10-CM | POA: Insufficient documentation

## 2013-07-16 MED ORDER — HYDROMORPHONE HCL PF 1 MG/ML IJ SOLN
1.0000 mg | Freq: Once | INTRAMUSCULAR | Status: AC
  Administered 2013-07-16: 1 mg via INTRAVENOUS
  Filled 2013-07-16: qty 1

## 2013-07-16 MED ORDER — ONDANSETRON HCL 4 MG/2ML IJ SOLN
4.0000 mg | Freq: Once | INTRAMUSCULAR | Status: AC
  Administered 2013-07-16: 4 mg via INTRAVENOUS
  Filled 2013-07-16: qty 2

## 2013-07-16 MED ORDER — HYDROCODONE-ACETAMINOPHEN 5-325 MG PO TABS: 1.0000 | ORAL_TABLET | Freq: Four times a day (QID) | ORAL | Status: DC | PRN

## 2013-07-16 NOTE — ED Notes (Signed)
Pt states slipped on ice and left leg went under her.  No LOC.  Pt has pain to left knee and upper leg.  Pulse palpable

## 2013-07-16 NOTE — ED Notes (Signed)
Patient transported to X-ray 

## 2013-07-16 NOTE — Progress Notes (Signed)
Orthopedic Tech Progress Note Patient Details:  Natasha [name] 10-12-1945 [id number]  Ortho Devices Type of Ortho Device: Crutches;Knee Immobilizer Ortho Device/Splint Location: LLE Ortho Device/Splint Interventions: Ordered;Application   Braulio Bosch 07/16/2013, 6:34 PM

## 2013-07-16 NOTE — ED Notes (Signed)
Ortho tech at bedside 

## 2013-07-16 NOTE — ED Notes (Signed)
Patient returned from X-ray 

## 2013-07-16 NOTE — ED Provider Notes (Addendum)
CSN: [id number]     Arrival date & time 07/16/13  1446 History   First MD Initiated Contact with Patient 07/16/13 1556     Chief Complaint  Patient presents with  . Knee Pain     (Consider location/radiation/quality/duration/timing/severity/associated sxs/prior Treatment) Patient is a 68 y.o. female presenting with knee pain. The history is provided by the patient and the spouse.  Knee Pain Associated symptoms: no back pain, no fever and no neck pain   pt states slip and fall on ice at home approximately noon today. States left knee/leg twisted under her towards side. She was able to roll to fence and pull self up, was able to ambulate w limp.  Since then notes persistent, worsening pain and swelling to knee. No prior injury or surgery. No leg or foot numbness/weakness. Skin intact. No hip or ankle pain. Denies other injury w fall. No head injury, loc, or headache. No neck or back pain. No faintness or dizziness prior to fall, states just slipped on patch of ice.      Past Medical History  Diagnosis Date  . Chronic kidney disease   . GERD (gastroesophageal reflux disease)   . Crohn's colitis   . CKD (chronic kidney disease) 06/09/2012  . Hypothyroidism 06/10/2012  . Arthritis    Past Surgical History  Procedure Laterality Date  . Colon resection    . Esophagogastroduodenoscopy  03/04/2012    Procedure: ESOPHAGOGASTRODUODENOSCOPY (EGD);  Surgeon: Garlan Fair, MD;  Location: [facility];  Service: Endoscopy;  Laterality: N/A;  . Flexible sigmoidoscopy  03/04/2012    Procedure: FLEXIBLE SIGMOIDOSCOPY;  Surgeon: Garlan Fair, MD;  Location: [facility];  Service: Endoscopy;  Laterality: N/A;  . Abdominal hysterectomy     No family history on file. History  Substance Use Topics  . Smoking status: Current Every Day Smoker -- 0.25 packs/day for 15 years  . Smokeless tobacco: Never Used  . Alcohol Use: 1.2 oz/week    2 Glasses of wine per week   OB History   Grav Para  Term Preterm Abortions TAB SAB Ect Mult Living                 Review of Systems  Constitutional: Negative for fever and chills.  HENT: Negative for sore throat.   Eyes: Negative for redness.  Respiratory: Negative for shortness of breath.   Cardiovascular: Negative for chest pain.  Gastrointestinal: Negative for nausea, vomiting and abdominal pain.  Genitourinary: Negative for flank pain.  Musculoskeletal: Negative for back pain and neck pain.  Skin: Negative for wound.  Neurological: Negative for weakness, numbness and headaches.  Hematological: Does not bruise/bleed easily.  Psychiatric/Behavioral: Negative for confusion.      Allergies  Mercaptopurine and Remicade  Home Medications   Current Outpatient Rx  Name  Route  Sig  Dispense  Refill  . acetaminophen (TYLENOL) 500 MG tablet   Oral   Take 500 mg by mouth every 6 (six) hours as needed. For severe pain         . calcitRIOL (ROCALTROL) 0.5 MCG capsule   Oral   Take 0.5 mcg by mouth daily.         . cyanocobalamin (,VITAMIN B-12,) 1000 MCG/ML injection   Intramuscular   Inject 1,000 mcg into the muscle every 30 (thirty) days. Last injection was 04/28/12         . fluticasone (FLONASE) 50 MCG/ACT nasal spray   Each Nare   Place 2 sprays into both  nostrils daily as needed for allergies or rhinitis.         . folic acid (FOLVITE) 1 MG tablet   Oral   Take 1 mg by mouth daily.         [name] guaiFENesin (MUCINEX) 600 MG 12 hr tablet   Oral   Take 600 mg by mouth 2 (two) times daily as needed for cough or to loosen phlegm.         . lactose free nutrition (BOOST PLUS) LIQD   Oral   Take 237 mLs by mouth 2 (two) times daily between meals.   30 Can   0   . levothyroxine (SYNTHROID, LEVOTHROID) 125 MCG tablet   Oral   Take 125 mcg by mouth daily.         . predniSONE (DELTASONE) 20 MG tablet   Oral   Take 2 tablets (40 mg total) by mouth daily.   60 tablet   0   . risedronate (ACTONEL) 35 MG  tablet   Oral   Take 35 mg by mouth every 7 (seven) days. with water on empty stomach, nothing by mouth or lie down for next 30 minutes. Takes on Monday         . vitamin B-12 (CYANOCOBALAMIN) 1000 MCG tablet   Oral   Take 1,000 mcg by mouth daily.          BP 155/83  Pulse 108  Temp(Src) 98.8 F (37.1 C) (Oral)  Resp 20  SpO2 100% Physical Exam  Nursing note and vitals reviewed. Constitutional: She is oriented to person, place, and time. She appears well-developed and well-nourished. No distress.  HENT:  Head: Atraumatic.  Mouth/Throat: Oropharynx is clear and moist.  Eyes: Conjunctivae are normal. Pupils are equal, round, and reactive to light. No scleral icterus.  Neck: Neck supple. No tracheal deviation present.  Cardiovascular: Normal rate, regular rhythm, normal heart sounds and intact distal pulses.   Pulmonary/Chest: Effort normal and breath sounds normal. No respiratory distress. She exhibits no tenderness.  Abdominal: Soft. Normal appearance. She exhibits no distension. There is no tenderness.  Musculoskeletal: She exhibits edema and tenderness.  CTLS spine, non tender, aligned, no step off. Left knee tenderness and swelling. Left knee grossly stable. Limited rom due to pain. Distal pulses dopplerable (pt notes chronic swelling bil feet/ankles at baseline, left > right at baseline). No pain/tenderness at hip or ankle.  Neurological: She is alert and oriented to person, place, and time.  Motor intact bil. sens intact.   Skin: Skin is warm and dry. No rash noted. She is not diaphoretic.  Intact.   Psychiatric: She has a normal mood and affect.    ED Course  Procedures (including critical care time)    Dg Femur Left  07/16/2013   CLINICAL DATA:  Fall.  Left femur pain.  EXAM: LEFT FEMUR - 2 VIEW  COMPARISON:  None.  FINDINGS: There is no evidence of fracture or other focal bone lesions. Soft tissues are unremarkable.  IMPRESSION: Negative.   Electronically Signed    By: Earle Gell M.D.   On: 07/16/2013 17:50   Dg Tibia/fibula Left  07/16/2013   CLINICAL DATA:  [place] on ice, knee and infrapatellar pain  EXAM: LEFT TIBIA AND FIBULA - 2 VIEW  COMPARISON:  None.  FINDINGS: Diffuse osseous demineralization.  Knee compartment joint space narrowing and chondrocalcinosis.  Soft tissue swelling greatest at lateral lower leg and ankle.  No acute fracture, dislocation, or bone destruction.  IMPRESSION: Osseous demineralization without definite acute bony abnormalities.  Significant lateral soft tissue swelling.  Degenerative changes left knee with question CPPD/pseudogout.   Electronically Signed   By: Lavonia Dana M.D.   On: 07/16/2013 17:50   Dg Knee Complete 4 Views Left  07/16/2013   CLINICAL DATA:  Fall.  Knee injury and pain.  EXAM: LEFT KNEE - COMPLETE 4+ VIEW  COMPARISON:  None.  FINDINGS: There is no evidence of fracture, dislocation, or joint effusion. Chondrocalcinosis is noted. No other signs of arthropathy or other focal bone lesions. Soft tissues are unremarkable.  IMPRESSION: No acute findings.  Chondrocalcinosis.   Electronically Signed   By: Earle Gell M.D.   On: 07/16/2013 17:50      MDM  Iv ns. Dilaudid 1 mg iv. zofran iv. Elevate knee, ice.  Xrays.  Reviewed nursing notes and prior charts for additional history.   Recheck, no increase is swelling as compared to prior. Distal pulses palp.  Knee immobilizer. Crutches. Pain controlled.  Pt drinking po fluids. Discussed diff dx including soft tissue, ligamentous, meniscal injury, and need for ortho follow up.  Pain remains controlled. No inc swelling. Pt stable for d/c.       Mirna Mires, MD 07/16/13 [phone]

## 2013-07-16 NOTE — ED Notes (Signed)
Left knee propped up; elevated; ice bag on knee.

## 2013-07-16 NOTE — Discharge Instructions (Signed)
Wear knee immobilizer, use crutches as need. Elevate left leg.  Ice/cold pack to sore area.  You may take hydrocodone as need for pain. No driving for the next 6 hours or when taking hydrocodone. Also, do not take tylenol or acetaminophen containing medication when taking hydrocodone. Follow up with orthopedist in coming week - see referral  -call office Monday morning to arrange appointment time.  For previous leg/ankle swelling, also follow up with primary care doctor in coming week.  Return to ER if worse, new symptoms, worsening swelling, intractable pain, leg numbness/weakness, other concern.     Knee Immobilizer A knee immobilizer is used to support and protect an injured or painful knee. Knee immobilizers keep your knee from being used while it is healing. Some of the common immobilizers used include splints (air, plaster, fiberglass, stiff cloth, or aluminum) or casts. Wear your knee immobilizer as instructed and only remove it as instructed. HOME CARE INSTRUCTIONS   Use absorbent powder (such as baby powder or talcum powder) to control irritation from sweat and friction.  Adjust the immobilizer to be firm but not tight. Signs of an immobilizer that is too tight include:  Swelling.  Numbness.  Color change in your foot or ankle.  Increased pain.  While resting, raise your leg above the level of your heart. Pillows can be used for support. This reduces throbbing and helps healing.  Remove the immobilizer to bathe and sleep. SEEK MEDICAL CARE IF:   You have increasing pain or swelling in the knee, foot, or ankle.  You have problems caused by the knee immobilizer or it breaks or needs replacement. MAKE SURE YOU:   Understand these instructions.  Will watch your condition.  Will get help right away if you are not doing well or get worse. Document Released: 05/12/2005 Document Revised: 03/02/2013 Document Reviewed: 01/03/2013 [facility] Patient Information 2014  [place].    Knee Effusion The medical term for having fluid in your knee is effusion. This is often due to an internal derangement of the knee. This means something is wrong inside the knee. Some of the causes of fluid in the knee may be torn cartilage, a torn ligament, or bleeding into the joint from an injury. Your knee is likely more difficult to bend and move. This is often because there is increased pain and pressure in the joint. The time it takes for recovery from a knee effusion depends on different factors, including:   Type of injury.  Your age.  Physical and medical conditions.  Rehabilitation Strategies. How long you will be away from your normal activities will depend on what kind of knee problem you have and how much damage is present. Your knee has two types of cartilage. Articular cartilage covers the bone ends and lets your knee bend and move smoothly. Two menisci, thick pads of cartilage that form a rim inside the joint, help absorb shock and stabilize your knee. Ligaments bind the bones together and support your knee joint. Muscles move the joint, help support your knee, and take stress off the joint itself. CAUSES  Often an effusion in the knee is caused by an injury to one of the menisci. This is often a tear in the cartilage. Recovery after a meniscus injury depends on how much meniscus is damaged and whether you have damaged other knee tissue. Small tears may heal on their own with conservative treatment. Conservative means rest, limited weight bearing activity and muscle strengthening exercises. Your recovery may take  up to 6 weeks.  TREATMENT  Larger tears may require surgery. Meniscus injuries may be treated during arthroscopy. Arthroscopy is a procedure in which your surgeon uses a small telescope like instrument to look in your knee. Your caregiver can make a more accurate diagnosis (learning what is wrong) by performing an arthroscopic procedure. If your injury  is on the inner margin of the meniscus, your surgeon may trim the meniscus back to a smooth rim. In other cases your surgeon will try to repair a damaged meniscus with stitches (sutures). This may make rehabilitation take longer, but may provide better long term result by helping your knee keep its shock absorption capabilities. Ligaments which are completely torn usually require surgery for repair. HOME CARE INSTRUCTIONS  Use crutches as instructed.  If a brace is applied, use as directed.  Once you are home, an ice pack applied to your swollen knee may help with discomfort and help decrease swelling.  Keep your knee raised (elevated) when you are not up and around or on crutches.  Only take over-the-counter or prescription medicines for pain, discomfort, or fever as directed by your caregiver.  Your caregivers will help with instructions for rehabilitation of your knee. This often includes strengthening exercises.  You may resume a normal diet and activities as directed. SEEK MEDICAL CARE IF:   There is increased swelling in your knee.  You notice redness, swelling, or increasing pain in your knee.  An unexplained oral temperature above 102 F (38.9 C) develops. SEEK IMMEDIATE MEDICAL CARE IF:   You develop a rash.  You have difficulty breathing.  You have any allergic reactions from medications you may have been given.  There is severe pain with any motion of the knee. MAKE SURE YOU:   Understand these instructions.  Will watch your condition.  Will get help right away if you are not doing well or get worse. Document Released: 08/02/2003 Document Revised: 08/04/2011 Document Reviewed: 10/06/2007 [facility] Patient Information 2014 [place].    Cryotherapy Cryotherapy means treatment with cold. Ice or gel packs can be used to reduce both pain and swelling. Ice is the most helpful within the first 24 to 48 hours after an injury or flareup from overusing a muscle  or joint. Sprains, strains, spasms, burning pain, shooting pain, and aches can all be eased with ice. Ice can also be used when recovering from surgery. Ice is effective, has very few side effects, and is safe for most people to use. PRECAUTIONS  Ice is not a safe treatment option for people with:  Raynaud's phenomenon. This is a condition affecting small blood vessels in the extremities. Exposure to cold may cause your problems to return.  Cold hypersensitivity. There are many forms of cold hypersensitivity, including:  Cold urticaria. Red, itchy hives appear on the skin when the tissues begin to warm after being iced.  Cold erythema. This is a red, itchy rash caused by exposure to cold.  Cold hemoglobinuria. Red blood cells break down when the tissues begin to warm after being iced. The hemoglobin that carry oxygen are passed into the urine because they cannot combine with blood proteins fast enough.  Numbness or altered sensitivity in the area being iced. If you have any of the following conditions, do not use ice until you have discussed cryotherapy with your caregiver:  Heart conditions, such as arrhythmia, angina, or chronic heart disease.  High blood pressure.  Healing wounds or open skin in the area being iced.  Current infections.  Rheumatoid arthritis.  Poor circulation.  Diabetes. Ice slows the blood flow in the region it is applied. This is beneficial when trying to stop inflamed tissues from spreading irritating chemicals to surrounding tissues. However, if you expose your skin to cold temperatures for too long or without the proper protection, you can damage your skin or nerves. Watch for signs of skin damage due to cold. HOME CARE INSTRUCTIONS Follow these tips to use ice and cold packs safely.  Place a dry or damp towel between the ice and skin. A damp towel will cool the skin more quickly, so you may need to shorten the time that the ice is used.  For a more rapid  response, add gentle compression to the ice.  Ice for no more than 10 to 20 minutes at a time. The bonier the area you are icing, the less time it will take to get the benefits of ice.  Check your skin after 5 minutes to make sure there are no signs of a poor response to cold or skin damage.  Rest 20 minutes or more in between uses.  Once your skin is numb, you can end your treatment. You can test numbness by very lightly touching your skin. The touch should be so light that you do not see the skin dimple from the pressure of your fingertip. When using ice, most people will feel these normal sensations in this order: cold, burning, aching, and numbness.  Do not use ice on someone who cannot communicate their responses to pain, such as small children or people with dementia. HOW TO MAKE AN ICE PACK Ice packs are the most common way to use ice therapy. Other methods include ice massage, ice baths, and cryo-sprays. Muscle creams that cause a cold, tingly feeling do not offer the same benefits that ice offers and should not be used as a substitute unless recommended by your caregiver. To make an ice pack, do one of the following:  Place crushed ice or a bag of frozen vegetables in a sealable plastic bag. Squeeze out the excess air. Place this bag inside another plastic bag. Slide the bag into a pillowcase or place a damp towel between your skin and the bag.  Mix 3 parts water with 1 part rubbing alcohol. Freeze the mixture in a sealable plastic bag. When you remove the mixture from the freezer, it will be slushy. Squeeze out the excess air. Place this bag inside another plastic bag. Slide the bag into a pillowcase or place a damp towel between your skin and the bag. SEEK MEDICAL CARE IF:  You develop white spots on your skin. This may give the skin a blotchy (mottled) appearance.  Your skin turns blue or pale.  Your skin becomes waxy or hard.  Your swelling gets worse. MAKE SURE YOU:    Understand these instructions.  Will watch your condition.  Will get help right away if you are not doing well or get worse. Document Released: 01/06/2011 Document Revised: 08/04/2011 Document Reviewed: 01/06/2011 [facility] Patient Information 2014 [place], [place].    Crutch Use Crutches are used to take weight off one of your legs or feet when you stand or walk. It is important to use crutches that fit properly. When fitted properly:  Each crutch should be 2 3 finger widths below the armpit.  Your weight should be supported by your hand, and not by resting the armpit on the crutch.  RISKS AND COMPLICATIONS Damage to the  nerves that extend from your armpit to your hand and arm. To prevent this from happening, make sure your crutches fit properly and do not put pressure on your armpit when using them. HOW TO USE YOUR CRUTCHES If you have been instructed to use partial weight bearing, apply (bear) the amount of weight as your health care provider suggests. Do not bear weight in an amount that causes pain to the area of injury. Walking 1. Step with the crutches. 2. Swing the healthy leg slightly ahead of the crutches. Going Up Steps If there is no handrail: 1. Step up with the healthy leg. 2. Step up with the crutches and injured leg. 3. Continue in this way. If there is a handrail: 1. Hold both crutches in one hand. 2. Place your free hand on the handrail. 3. While putting your weight on your arms, lift your healthy leg to the step. 4. Bring the crutches and the injured leg up to that step. 5. Continue in this way. Going Down Steps Be very careful, as going down stairs with crutches is very challenging. If there is no handrail: 1. Step down with the injured leg and crutches. 2. Step down with the healthy leg. If there is a handrail: 1. Place your hand on the handrail. 2. Hold both crutches with your free hand. 3. Lower your injured leg and crutch to the step below you. Make  sure to keep the crutch tips in the center of the step, never on the edge. 4. Lower your healthy leg to that step. 5. Continue in this way. Standing Up 1. Hold the injured leg forward. 2. Grab the armrest with one hand and the top of the crutches with the other hand. 3. Using these supports, pull yourself up to a standing position. Sitting Down 1. Hold the injured leg forward. 2. Grab the armrest with one hand and the top of the crutches with the other hand. 3. Lower yourself to a sitting position. SEEK MEDICAL CARE IF:  You still feel unsteady on your feet.  You develop new pain, for example in your armpits, back, shoulder, wrist, or hip.  You develop any numbness or tingling. SEEK IMMEDIATE MEDICAL CARE: You fall. Document Released: 05/09/2000 Document Revised: 03/02/2013 Document Reviewed: 01/17/2013 [facility] Patient Information 2014 [place].   Fall Prevention and Home Safety Falls cause injuries and can affect all age groups. It is possible to use preventive measures to significantly decrease the likelihood of falls. There are many simple measures which can make your home safer and prevent falls. OUTDOORS  Repair cracks and edges of walkways and driveways.  Remove high doorway thresholds.  Trim shrubbery on the main path into your home.  Have good outside lighting.  Clear walkways of tools, rocks, debris, and clutter.  Check that handrails are not broken and are securely fastened. Both sides of steps should have handrails.  Have leaves, snow, and ice cleared regularly.  Use sand or salt on walkways during winter months.  In the garage, clean up grease or oil spills. BATHROOM  Install night lights.  Install grab bars by the toilet and in the tub and shower.  Use non-skid mats or decals in the tub or shower.  Place a plastic non-slip stool in the shower to sit on, if needed.  Keep floors dry and clean up all water on the floor immediately.  Remove  soap buildup in the tub or shower on a regular basis.  Secure bath mats with non-slip, double-sided  rug tape.  Remove throw rugs and tripping hazards from the floors. BEDROOMS  Install night lights.  Make sure a bedside light is easy to reach.  Do not use oversized bedding.  Keep a telephone by your bedside.  Have a firm chair with side arms to use for getting dressed.  Remove throw rugs and tripping hazards from the floor. KITCHEN  Keep handles on pots and pans turned toward the center of the stove. Use back burners when possible.  Clean up spills quickly and allow time for drying.  Avoid walking on wet floors.  Avoid hot utensils and knives.  Position shelves so they are not too high or low.  Place commonly used objects within easy reach.  If necessary, use a sturdy step stool with a grab bar when reaching.  Keep electrical cables out of the way.  Do not use floor polish or wax that makes floors slippery. If you must use wax, use non-skid floor wax.  Remove throw rugs and tripping hazards from the floor. STAIRWAYS  Never leave objects on stairs.  Place handrails on both sides of stairways and use them. Fix any loose handrails. Make sure handrails on both sides of the stairways are as long as the stairs.  Check carpeting to make sure it is firmly attached along stairs. Make repairs to worn or loose carpet promptly.  Avoid placing throw rugs at the top or bottom of stairways, or properly secure the rug with carpet tape to prevent slippage. Get rid of throw rugs, if possible.  Have an electrician put in a light switch at the top and bottom of the stairs. OTHER FALL PREVENTION TIPS  Wear low-heel or rubber-soled shoes that are supportive and fit well. Wear closed toe shoes.  When using a stepladder, make sure it is fully opened and both spreaders are firmly locked. Do not climb a closed stepladder.  Add color or contrast paint or tape to grab bars and handrails  in your home. Place contrasting color strips on first and last steps.  Learn and use mobility aids as needed. Install an electrical emergency response system.  Turn on lights to avoid dark areas. Replace light bulbs that burn out immediately. Get light switches that glow.  Arrange furniture to create clear pathways. Keep furniture in the same place.  Firmly attach carpet with non-skid or double-sided tape.  Eliminate uneven floor surfaces.  Select a carpet pattern that does not visually hide the edge of steps.  Be aware of all pets. OTHER HOME SAFETY TIPS  Set the water temperature for 120 F (48.8 C).  Keep emergency numbers on or near the telephone.  Keep smoke detectors on every level of the home and near sleeping areas. Document Released: 05/02/2002 Document Revised: 11/11/2011 Document Reviewed: 08/01/2011 [facility] Patient Information 2014 [place].

## 2013-07-22 ENCOUNTER — Institutional Professional Consult (permissible substitution): Payer: Medicare Other | Admitting: Cardiovascular Disease

## 2013-08-01 ENCOUNTER — Other Ambulatory Visit (HOSPITAL_COMMUNITY): Payer: Self-pay | Admitting: Internal Medicine

## 2013-08-01 DIAGNOSIS — J438 Other emphysema: Secondary | ICD-10-CM

## 2013-08-01 LAB — PULMONARY FUNCTION TEST
DL/VA % PRED: 35 %
DL/VA: 1.68 ml/min/mmHg/L
DLCO COR: 4.99 ml/min/mmHg
DLCO cor % pred: 20 %
DLCO unc % pred: 20 %
DLCO unc: 4.99 ml/min/mmHg
FEF 25-75 PRE: 0.38 L/s
FEF 25-75 Post: 0.37 L/sec
FEF2575-%CHANGE-POST: 0 %
FEF2575-%PRED-POST: 20 %
FEF2575-%Pred-Pre: 21 %
FEV1-%CHANGE-POST: 1 %
FEV1-%PRED-PRE: 51 %
FEV1-%Pred-Post: 52 %
FEV1-POST: 1 L
FEV1-PRE: 0.98 L
FEV1FVC-%CHANGE-POST: 2 %
FEV1FVC-%Pred-Pre: 62 %
FEV6-%Change-Post: 1 %
FEV6-%Pred-Post: 82 %
FEV6-%Pred-Pre: 81 %
FEV6-PRE: 1.92 L
FEV6-Post: 1.94 L
FEV6FVC-%Change-Post: 1 %
FEV6FVC-%PRED-PRE: 99 %
FEV6FVC-%Pred-Post: 101 %
FVC-%Change-Post: 0 %
FVC-%Pred-Post: 81 %
FVC-%Pred-Pre: 81 %
FVC-Post: 2 L
FVC-Pre: 2.01 L
POST FEV1/FVC RATIO: 50 %
Post FEV6/FVC ratio: 97 %
Pre FEV1/FVC ratio: 49 %
Pre FEV6/FVC Ratio: 96 %
RV % pred: 74 %
RV: 1.58 L
TLC % pred: 78 %
TLC: 3.97 L

## 2013-08-08 ENCOUNTER — Ambulatory Visit (HOSPITAL_COMMUNITY)
Admission: RE | Admit: 2013-08-08 | Discharge: 2013-08-08 | Disposition: A | Discharge status: Home / Self Care | Payer: Medicare Other | Source: Ambulatory Visit | Attending: Internal Medicine | Admitting: Internal Medicine

## 2013-08-08 ENCOUNTER — Encounter (INDEPENDENT_AMBULATORY_CARE_PROVIDER_SITE_OTHER): Payer: Self-pay

## 2013-08-08 DIAGNOSIS — J438 Other emphysema: Secondary | ICD-10-CM | POA: Insufficient documentation

## 2013-08-08 MED ORDER — ALBUTEROL SULFATE (2.5 MG/3ML) 0.083% IN NEBU
2.5000 mg | INHALATION_SOLUTION | Freq: Once | RESPIRATORY_TRACT | Status: AC
  Administered 2013-08-08: 2.5 mg via RESPIRATORY_TRACT

## 2013-08-17 ENCOUNTER — Institutional Professional Consult (permissible substitution): Payer: Medicare Other | Admitting: Cardiovascular Disease

## 2013-09-20 ENCOUNTER — Ambulatory Visit (INDEPENDENT_AMBULATORY_CARE_PROVIDER_SITE_OTHER): Payer: Medicare Other | Admitting: Cardiovascular Disease

## 2013-09-20 ENCOUNTER — Encounter: Payer: Self-pay | Admitting: Cardiovascular Disease

## 2013-09-20 VITALS — BP 123/73 | HR 86 | Ht 65.0 in | Wt 119.0 lb

## 2013-09-20 DIAGNOSIS — R0600 Dyspnea, unspecified: Secondary | ICD-10-CM | POA: Insufficient documentation

## 2013-09-20 DIAGNOSIS — R0989 Other specified symptoms and signs involving the circulatory and respiratory systems: Secondary | ICD-10-CM

## 2013-09-20 DIAGNOSIS — R000 Tachycardia, unspecified: Secondary | ICD-10-CM

## 2013-09-20 DIAGNOSIS — R0602 Shortness of breath: Secondary | ICD-10-CM

## 2013-09-20 DIAGNOSIS — R0609 Other forms of dyspnea: Secondary | ICD-10-CM

## 2013-09-20 DIAGNOSIS — I498 Other specified cardiac arrhythmias: Secondary | ICD-10-CM

## 2013-09-20 NOTE — Progress Notes (Signed)
Natasha [name] Date of Birth  Dec 25, 1945       [facility] [address], [place], [place]  [address]   [place], [place]  [address] [place]   Fax  [phone]     Fax [phone]  Problem List: 1. Hypertension 2. Chronic kidney disease 3. Hypothyroidism 4. 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  History of Present Illness:  Ms Gunner is a 68 yo female who is sent here for evaluation of severe dyspnea.  She has severe COPD - severe obstructive disease and severe reduction of her DLCO.  She quite smoking but is exposed to 2nd hand smoke ( husband and son)  .  She denies any episodes of chest pain.   she does not get any regular exercise.  She had an echo in Feb. 2015: Left ventricle: The cavity size was mildly dilated. Wall thickness was normal. The estimated ejection fraction was 55%. - Aortic valve: Moderate regurgitation. - Mitral valve: Calcified annulus. Mildly thickened leaflets . Mild regurgitation. - Atrial septum: No defect or patent foramen ovale was identified. - Impressions: Abnormal global strain and E/E' ratio approaching 15 suggest significantly abnormal diastolic function   Current Outpatient Prescriptions on File Prior to Visit  Medication Sig Dispense Refill  . acetaminophen (TYLENOL) 500 MG tablet Take 500 mg by mouth every 6 (six) hours as needed. For severe pain      . calcitRIOL (ROCALTROL) 0.5 MCG capsule Take 0.5 mcg by mouth daily.      . cyanocobalamin (,VITAMIN B-12,) 1000 MCG/ML injection Inject 1,000 mcg into the muscle every 30 (thirty) days. Last injection was 04/28/12      . fluticasone (FLONASE) 50 MCG/ACT nasal spray Place 2 sprays into both nostrils daily as needed for allergies or rhinitis.      . folic acid (FOLVITE) 1 MG tablet Take 1 mg by mouth daily.      [name] guaiFENesin  (MUCINEX) 600 MG 12 hr tablet Take 600 mg by mouth 2 (two) times daily as needed for cough or to loosen phlegm.      [name] HYDROcodone-acetaminophen (NORCO/VICODIN) 5-325 MG per tablet Take 1-2 tablets by mouth every 6 (six) hours as needed for moderate pain.  20 tablet  0  . levothyroxine (SYNTHROID, LEVOTHROID) 125 MCG tablet Take 125 mcg by mouth daily.      . risedronate (ACTONEL) 35 MG tablet Take 35 mg by mouth every 7 (seven) days. with water on empty stomach, nothing by mouth or lie down for next 30 minutes. Takes on Monday       No current facility-administered medications on file prior to visit.    Allergies  Allergen Reactions  . Mercaptopurine Other (See Comments)    Unknown reaction  . Remicade [Infliximab] Other (See Comments)    Unknown reaction    Past Medical History  Diagnosis Date  . Chronic kidney disease   . GERD (gastroesophageal reflux disease)   . Crohn's colitis   . CKD (chronic kidney disease) 06/09/2012  . Hypothyroidism 06/10/2012  . Arthritis     Past Surgical History  Procedure Laterality Date  . Colon resection    . Esophagogastroduodenoscopy  03/04/2012    Procedure: ESOPHAGOGASTRODUODENOSCOPY (EGD);  Surgeon: Garlan Fair, MD;  Location: [facility];  Service: Endoscopy;  Laterality: N/A;  . Flexible sigmoidoscopy  03/04/2012  Procedure: FLEXIBLE SIGMOIDOSCOPY;  Surgeon: Garlan Fair, MD;  Location: [facility];  Service: Endoscopy;  Laterality: N/A;  . Abdominal hysterectomy      History  Smoking status  . Current Every Day Smoker -- 0.25 packs/day for 15 years  Smokeless tobacco  . Never Used    History  Alcohol Use  . 1.2 oz/week  . 2 Glasses of wine per week    No family history on file.  Reviw of Systems:  Reviewed in the HPI.  All other systems are negative.  Physical Exam: Blood pressure 123/73, pulse 86, height 5' 5"  (1.651 m), weight 119 lb (53.978 kg). Wt Readings from Last 3 Encounters:  09/20/13 119 lb (53.978  kg)  05/18/13 118 lb 6.4 oz (53.706 kg)  06/14/12 150 lb 12.7 oz (68.4 kg)     General: Well developed, well nourished, in no acute distress.  Head: Normocephalic, atraumatic, sclera non-icteric, mucus membranes are moist,   Neck: Supple. Carotids are 2 + without bruits. No JVD   Lungs: Clear   Heart: RR, slightly tachycardic  Abdomen: Soft, non-tender, non-distended with normal bowel sounds.  Msk:  Strength and tone are normal   Extremities: No clubbing or cyanosis. Trace  edema.  Distal pedal pulses are 2+ and equal    Neuro: CN II - XII intact.  Alert and oriented X 3.   Psych:  Normal   ECG: September 20, 2013:  Sinus tach at 103.  PACs, RAE  Assessment / Plan:

## 2013-09-20 NOTE — Assessment & Plan Note (Signed)
Natasha [name] and was referred to [place] for further evaluation of her shortness of breath and tachycardia. She has already had poor function tests which reveals severe COPD as well as a severe reduction in her diffusion capacity. She's had an echocardiogram in February, 2015 which reveals normal left systolic function with moderate aortic insufficiency. She also has some evidence of diastolic dysfunction.  At this point I do not think there is any reason to pursue any other cardiac etiologies for shortness breath. She clearly is limited by her severe COPD.  She remains tachycardic partially because of her medications and also because of her COPD. In addition, her last TSH was 0.9 she may be slightly over replaced from a thyroid standpoint. I would recommend that she have a TSH drawn soon.  She has moderate aortic insufficiency but this is not nearly severe enough to be causing her any problems.   We could try low-dose diltiazem to help slow her heart rate but I'll leave this up to her medical Dr.    I  Will not schedule her for a return apt. But  will see her on an as needed basis.   At this point, I do not think there are any indications to do any further cardiac evaluation.

## 2013-09-20 NOTE — Patient Instructions (Signed)
Your physician recommends that you schedule a follow-up appointment as needed with Dr. Acie Fredrickson

## 2013-09-21 ENCOUNTER — Encounter: Payer: Self-pay | Admitting: Neurology

## 2013-09-28 ENCOUNTER — Encounter: Payer: Self-pay | Admitting: Neurology

## 2013-09-28 ENCOUNTER — Encounter (INDEPENDENT_AMBULATORY_CARE_PROVIDER_SITE_OTHER): Payer: Self-pay

## 2013-09-28 ENCOUNTER — Ambulatory Visit (INDEPENDENT_AMBULATORY_CARE_PROVIDER_SITE_OTHER): Payer: Medicare Other | Admitting: Neurology

## 2013-09-28 VITALS — BP 129/85 | HR 84 | Resp 16 | Ht 64.0 in | Wt 120.0 lb

## 2013-09-28 DIAGNOSIS — J439 Emphysema, unspecified: Secondary | ICD-10-CM

## 2013-09-28 DIAGNOSIS — M79605 Pain in left leg: Secondary | ICD-10-CM

## 2013-09-28 DIAGNOSIS — J438 Other emphysema: Secondary | ICD-10-CM

## 2013-09-28 DIAGNOSIS — M545 Low back pain, unspecified: Secondary | ICD-10-CM

## 2013-09-28 DIAGNOSIS — I70219 Atherosclerosis of native arteries of extremities with intermittent claudication, unspecified extremity: Secondary | ICD-10-CM

## 2013-09-28 DIAGNOSIS — M79604 Pain in right leg: Secondary | ICD-10-CM

## 2013-09-28 DIAGNOSIS — M79609 Pain in unspecified limb: Secondary | ICD-10-CM

## 2013-09-28 NOTE — Progress Notes (Signed)
[facility]  Provider:  Larey Seat, M D  Referring Provider: Nena Jordan, [facility]* Primary Care Physician:  Cloyd Stagers, MD  Chief Complaint  Patient presents with  . New Evaluation    Room 11  . Bilatater leg pain    HPI:  Natasha [name] is a 68 y.o. female  Is seen here as a referral  from Dr. Earle Gell, [facility] . She used to be followed by Dr.  Anderson Malta  from 1978 to 2011.  Natasha. [name]  is a right-handed, married , Afro-American female patient , who was referred for the evaluation of bilateral leg pain with a question of a spinal cord injury or peripheral neuropathy that maybe at the origin.  Due  to her long-standing history of Crohn's disease she has been followed by Gi for  3 decades, the rheumatologist recently consulted with her and wondered if she had the pain as a result of spinal disease or peripheral neuropathy, both  can be seen in Crohn's ileitis.  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 The patient has been taking chronic needles prednisone for control of Crohn's disease she inhaler therapies for COPD including Proventil q. wire and through either. She has hypothyroidism.   Natasha Santillanes noticed last year in December a achy pain from the groin down to the feet. She felt the joints were tender. She fell in February 2015 and needed braces and crutches , the left leg is still tender at the knee, the left leg was swollen for a while. Dr.Mattingly stated she was in stage 4 renal failure , not ready for Dialysis.  Dr. Wynetta Emery was concerned this pain could be from Crohn's disease and referred this to a Rheumatologist.    The patient reports pain in her lower back when lifting a roast out of the oven, and relief when shopping , since she is leaning over the shopping cart.  This helps her pain. She has pain getting in and out of the car. She has trouble dressing , getting into the trousers.   Labs reviewed.  Images cannot be reviewed.    Review of Systems: Out of a complete 14 system review, the patient complains of only the following symptoms, and all other reviewed systems are negative. The patient reports pain in her lower back when lifting a roast out of the oven, and relief when shopping , since she is leaning over the shopping cart. This helps her pain. She has pain getting in and out of the car. She has trouble dressing , getting into the trousers.   very cold feet, pulses not palpable at dorsum .  Walks less than 50 yeard before needing a rest, sitting or standing still.      Cannot walk well, knee pain, tenderness, pain when lifting a weight over 6 pounds.  History   Social History  . Marital Status: Married    Spouse Name: [name]    Number of Children: 3  . Years of Education: N/A   Occupational History  .     Social History Main Topics  . Smoking status: Former Smoker -- 0.25 packs/day for 15 years  . Smokeless tobacco: Never Used     Comment: Quit smoking in 2003  . Alcohol Use: 1.2 oz/week    2 Glasses of wine per week  Comment: 1-2 drinks monthly  . Drug Use: No  . Sexual Activity: No   Other Topics Concern  . Not on file   Social History Narrative   Patient is married [name]) and lives at home with her husband.   Patient has three children.   Patient is retired on disability.   Patient drinks two cups of caffeine daily.   Patient is right-handed.             Family History  Problem Relation Age of Onset  . Hypertension Father   . Diverticulitis Mother   . COPD Mother   . Hypertension Mother   . Colon cancer Brother   . Diabetes Brother   . Thyroid disease Brother     Past Medical History  Diagnosis Date  . Chronic kidney disease   . GERD (gastroesophageal reflux disease)   . Crohn's colitis   . CKD  (chronic kidney disease) 06/09/2012  . Hypothyroidism 06/10/2012  . Arthritis   . Osteoporosis   . Hypertension   . Plantar warts   . Postmenopausal   . Glaucoma   . Bulging disc     L3-L4  . Atypical chest pain   . Emphysema/COPD     Past Surgical History  Procedure Laterality Date  . Colon resection    . Esophagogastroduodenoscopy  03/04/2012    Procedure: ESOPHAGOGASTRODUODENOSCOPY (EGD);  Surgeon: Garlan Fair, MD;  Location: [facility];  Service: Endoscopy;  Laterality: N/A;  . Flexible sigmoidoscopy  03/04/2012    Procedure: FLEXIBLE SIGMOIDOSCOPY;  Surgeon: Garlan Fair, MD;  Location: [facility];  Service: Endoscopy;  Laterality: N/A;  . Abdominal hysterectomy    . Appendectomy    . Thyroidectomy      Current Outpatient Prescriptions  Medication Sig Dispense Refill  . acetaminophen (TYLENOL) 500 MG tablet Take 500 mg by mouth every 6 (six) hours as needed. For severe pain      . albuterol (PROVENTIL HFA;VENTOLIN HFA) 108 (90 BASE) MCG/ACT inhaler Inhale 2 puffs into the lungs every 6 (six) hours as needed for wheezing or shortness of breath.      . calcitRIOL (ROCALTROL) 0.5 MCG capsule Take 0.5 mcg by mouth daily.      . cyanocobalamin (,VITAMIN B-12,) 1000 MCG/ML injection Inject 1,000 mcg into the muscle every 30 (thirty) days. Last injection was 04/28/12      . cyclobenzaprine (FLEXERIL) 10 MG tablet Take 10 mg by mouth. One tablet as needed.      [name] esomeprazole (NEXIUM) 40 MG capsule Take 40 mg by mouth daily at 12 noon. 30 minutes prior to breakfast      . fluticasone (FLONASE) 50 MCG/ACT nasal spray Place 2 sprays into both nostrils daily as needed for allergies or rhinitis.      . folic acid (FOLVITE) 1 MG tablet Take 1 mg by mouth daily.      . furosemide (LASIX) 40 MG tablet Take 40 mg by mouth 2 (two) times daily.      [name] HYDROcodone-acetaminophen (NORCO/VICODIN) 5-325 MG per tablet Take 1-2 tablets by mouth every 6 (six) hours as needed for moderate  pain.  20 tablet  0  . levothyroxine (SYNTHROID, LEVOTHROID) 125 MCG tablet Take 125 mcg by mouth daily.      . potassium chloride (KLOR-CON) 20 MEQ packet Take 20 mEq by mouth 3 (three) times daily.      . predniSONE (DELTASONE) 5 MG tablet Take 5 mg by mouth daily with breakfast.      .  promethazine (PHENERGAN) 25 MG tablet Take by mouth every 6 (six) hours as needed for nausea or vomiting. Taking 1-2 tablets      . QVAR 80 MCG/ACT inhaler as needed.      . sodium bicarbonate 650 MG tablet Take 650 mg by mouth 3 (three) times daily.      [name] SPIRIVA HANDIHALER 18 MCG inhalation capsule as needed.       No current facility-administered medications for this visit.    Allergies as of 09/28/2013 - Review Complete 09/28/2013  Allergen Reaction Noted  . Mercaptopurine Other (See Comments) 03/04/2012  . Remicade [infliximab] Other (See Comments) 03/04/2012    Vitals: BP 129/85  Pulse 84  Resp 16  Ht 5' 4"  (1.626 m)  Wt 120 lb (54.432 kg)  BMI 20.59 kg/m2 Last Weight:  Wt Readings from Last 1 Encounters:  09/28/13 120 lb (54.432 kg)   Last Height:   Ht Readings from Last 1 Encounters:  09/28/13 5' 4"  (1.626 m)    Physical exam:  General: The patient is awake, alert and appears not in acute distress. The patient is well groomed. Head: Normocephalic, atraumatic. Neck is supple. Mallampati 3, neck circumference:14.5 inches, no TMJ ,  Cardiovascular:  Regular rate and rhythm , without  murmurs or carotid bruit, and without distended neck veins. Respiratory: Lungs are clear , wheezing with deep expiration.  Skin:  Without evidence of edema, or rash Trunk: n/a  Neurologic exam : The patient is awake and alert, oriented to place and time.  Memory subjectivedescribed as intact. There is a normal attention span & concentration ability. Speech is fluent without  dysarthria, dysphonia or aphasia. Mood and affect are appropriate.  Cranial nerves: Pupils are equal and briskly reactive to  light. Funduscopic exam without  evidence of pallor or edema. Extraocular movements  in vertical and horizontal planes intact and without nystagmus. Visual fields by finger perimetry are intact. Hearing to finger rub intact.  Facial sensation intact to fine touch. Facial motor strength is symmetric and tongue and uvula move midline.  Motor exam:   symmetric muscle bulk, but symmetric weakness of hip flexion, adduction and abduction, foot dorsifexion and plantarflexion ,   Sensory:  Fine touch, pinprick and vibration were tested in all extremities. She feels vibration below the ankle.  Proprioception is tested in the upper extremities only. This was normal.  Coordination: Rapid alternating movements in the fingers/hands is tested and normal.  Finger-to-nose maneuver tested and normal without evidence of ataxia, dysmetria or tremor.  Gait and station: Patient walks without assistive device , she walks with a wide base, needs  Assistance to climb up to the exam table.  Romberg testing is positive.  Deep tendon reflexes: in the  upper and lower extremities are brisk - 3 plus.  symmetric . Babinski maneuver response is upgoing !   Assessment:  After physical and neurologic examination, review of laboratory studies, imaging, neurophysiology testing and pre-existing records, assessment is   Hyperreflexia in a patient with lower back pain and possible claudication - walks less than 50 years before getting leg pain. She needs frequent  Resting periods, she quit smoking 10 month ago- she is still  a passive Smoker, husband and son smoke in the house and car.  She can get pain relief when leaning onto a shopping cart, which would support the spinal stenosis theory and explain hyperreflexia.    Plan:  Treatment plan and additional workup :  MRI spine , thoracic and lumbar.  Non contrast.  Lower extremity doppler,  EMG and NCV for lower back.  Neuropathy panel.

## 2013-09-28 NOTE — Patient Instructions (Signed)
Intermittent Claudication Blockage of leg arteries results from poor circulation of blood in the leg arteries. This produces an aching, tired, and sometimes burning pain in the legs that is brought on by exercise and made better by rest. Claudication refers to the limping that happens from leg cramps. It is also referred to as Vaso-occlusive disease of the legs, arterial insufficiency of the legs, recurrent leg pain, recurrent leg cramping and calf pain with exercise.  CAUSES  This condition is due to narrowing or blockage of the arteries (muscular vessels which carry blood away from the heart and around the body). Blockage of arteries can occur anywhere in the body. If they occur in the heart, a person may experience angina (chest pain) or even a heart attack. If they occur in the neck or the brain, a person may have a stroke. Intermittent claudication is when the blockage occurs in the legs, most commonly in the calf or the foot.  Atherosclerosis, or blockage of arteries, can occur for many reasons. Some of these are smoking, diabetes, and high cholesterol. SYMPTOMS  Intermittent claudication may occur in both legs, and it often continues to get worse over time. However, some people complain only of weakness in the legs when walking, or a feeling of "tiredness" in the buttocks. Impotence (not able to have an erection) is an occasional complaint in men. Pain while resting is uncommon.  WHAT TO EXPECT AT [facility] PROVIDER'S OFFICE: Your medical history will be asked for and a physical examination will be performed. Medical history questions documenting claudication in detail may include:   Time pattern  Do you have leg cramps at night (nocturnal cramps)?  How often does leg pain with cramping occur?  Is it getting worse?  What is the quality of the pain?  Is the pain sharp?  Is there an aching pain with the cramps?  Aggravating factors  Is it worse after you exercise?  Is it  worse after you are standing for a while?  Do you smoke? How much?  Do you drink alcohol? How much?  Are you diabetic? How well is your blood sugar controlled?  Other  What other symptoms are also present?  Has there been impotence (men)?  Is there pain in the back?  Is there a darkening of the skin of the legs, feet or toes?  Is there weakness or paralysis of the legs? The physical examination may include evaluation of the femoral pulse (in the groin) and the other areas where the pulse can be felt in the legs. DIAGNOSIS  Diagnostic tests that may be performed include:  Blood pressure measured in arms and legs for comparison.  Doppler ultrasonography on the legs and the heart.  Duplex Doppler/ultrasound exam of extremity to visualize arterial blood flow.  ECG- to evaluate the activity of your heart.  Aortography- to visualize blockages in your arteries. TREATMENT Surgical treatment may be suggested if claudication interferes with the patient's activities or work, and if the diseased arteries do not seem to be improving after treatment. Be aware that this condition can worsen over time and you should carefully monitor your condition. HOME CARE INSTRUCTIONS  Talk to your caregiver about the cause of your leg cramping and about what to do at home to relieve it.  A healthy diet is important to lessen the likeliness of atherosclerosis.  A program of daily walking for short periods, and stopping for pain or cramping, may help improve function.  It is important to  stop smoking.  Avoid putting hot or cold items on legs.  Avoid tight shoes. SEEK MEDICAL CARE IF: There are many other causes of leg pain such as arthritis or low blood potassium. However, some causes of leg pain may be life threatening such as a blood clot in the legs. Seek medical attention if you have:  Leg pain that does not go away.  Legs that may be red, hot or swollen.  Ulcers or sores appear on your  ankle or foot.  Any chest pain or shortness of breath accompanying leg pain.  Diabetes.  You are pregnant. SEEK IMMEDIATE MEDICAL CARE IF:   Your leg pain becomes severe or will not go away.  Your foot turns blue or a dark color.  Your leg becomes red, hot or swollen or you develop a fever over 102F.  Any chest pain or shortness of breath accompanying leg pain. MAKE SURE YOU:   Understand these instructions.  Will watch your condition.  Will get help right away if you are not doing well or get worse. Document Released: 03/14/2004 Document Revised: 08/04/2011 Document Reviewed: 12/31/2007 [facility] Patient Information 2014 [place]. Peripheral Neuropathy Peripheral neuropathy is a type of nerve damage. It affects nerves that carry signals between the spinal cord and other parts of the body. These are called peripheral nerves. With peripheral neuropathy, one nerve or a group of nerves may be damaged.  CAUSES  Many things can damage peripheral nerves. For some people with peripheral neuropathy, the cause is unknown. Some causes include:  Diabetes. This is the most common cause of peripheral neuropathy.  Injury to a nerve.  Pressure or stress on a nerve that lasts a long time.  Too little vitamin B. Alcoholism can lead to this.  Infections.  Autoimmune diseases, such as multiple sclerosis and systemic lupus erythematosus.  Inherited nerve diseases.  Some medicines, such as cancer drugs.  Toxic substances, such as lead and mercury.  Too little blood flowing to the legs.  Kidney disease.  Thyroid disease. SIGNS AND SYMPTOMS  Different people have different symptoms. The symptoms you have will depend on which of your nerves is damaged. Common symptoms include:  Loss of feeling (numbness) in the feet and hands.  Tingling in the feet and hands.  Pain that burns.  Very sensitive skin.  Weakness.  Not being able to move a part of the body  (paralysis).  Muscle twitching.  Clumsiness or poor coordination.  Loss of balance.  Not being able to control your bladder.  Feeling dizzy.  Sexual problems. DIAGNOSIS  Peripheral neuropathy is a symptom, not a disease. Finding the cause of peripheral neuropathy can be hard. To figure that out, your health care provider will take a medical history and do a physical exam. A neurological exam will also be done. This involves checking things affected by your brain, spinal cord, and nerves (nervous system). For example, your health care provider will check your reflexes, how you move, and what you can feel.  Other types of tests may also be ordered, such as:  Blood tests.  A test of the fluid in your spinal cord.  Imaging tests, such as CT scans or an MRI.  Electromyography (EMG). This test checks the nerves that control muscles.  Nerve conduction velocity tests. These tests check how fast messages pass through your nerves.  Nerve biopsy. A small piece of nerve is removed. It is then checked under a microscope. TREATMENT   Medicine is often used to  treat peripheral neuropathy. Medicines may include:  Pain-relieving medicines. Prescription or over-the-counter medicine may be suggested.  Antiseizure medicine. This may be used for pain.  Antidepressants. These also may help ease pain from neuropathy.  Lidocaine. This is a numbing medicine. You might wear a patch or be given a shot.  Mexiletine. This medicine is typically used to help control irregular heart rhythms.  Surgery. Surgery may be needed to relieve pressure on a nerve or to destroy a nerve that is causing pain.  Physical therapy to help movement.  Assistive devices to help movement. HOME CARE INSTRUCTIONS   Only take over-the-counter or prescription medicines as directed by your health care provider. Follow the instructions carefully for any given medicines. Do not take any other medicines without first getting  approval from your health care provider.  If you have diabetes, work closely with your health care provider to keep your blood sugar under control.  If you have numbness in your feet:  Check every day for signs of injury or infection. Watch for redness, warmth, and swelling.  Wear padded socks and comfortable shoes. These help protect your feet.  Do not do things that put pressure on your damaged nerve.  Do not smoke. Smoking keeps blood from getting to damaged nerves.  Avoid or limit alcohol. Too much alcohol can cause a lack of B vitamins. These vitamins are needed for healthy nerves.  Develop a good support system. Coping with peripheral neuropathy can be stressful. Talk to a mental health specialist or join a support group if you are struggling.  Follow up with your health care provider as directed. SEEK MEDICAL CARE IF:   You have new signs or symptoms of peripheral neuropathy.  You are struggling emotionally from dealing with peripheral neuropathy.  You have a fever. SEEK IMMEDIATE MEDICAL CARE IF:   You have an injury or infection that is not healing.  You feel very dizzy or begin vomiting.  You have chest pain.  You have trouble breathing. Document Released: 05/02/2002 Document Revised: 01/22/2011 Document Reviewed: 01/17/2013 [facility] Patient Information 2014 [place].

## 2013-09-30 LAB — NEUROPATHY PANEL
A/G Ratio: 1.4 (ref 0.7–2.0)
ALPHA 1: 0.2 g/dL (ref 0.1–0.4)
Albumin ELP: 3.9 g/dL (ref 3.2–5.6)
Alpha 2: 0.7 g/dL (ref 0.4–1.2)
Angio Convert Enzyme: 40 U/L (ref 14–82)
Anti Nuclear Antibody(ANA): POSITIVE — AB
Beta: 1 g/dL (ref 0.6–1.3)
Gamma Globulin: 0.8 g/dL (ref 0.5–1.6)
Globulin, Total: 2.7 g/dL (ref 2.0–4.5)
Rhuematoid fact SerPl-aCnc: 8.5 IU/mL (ref 0.0–13.9)
Sed Rate: 9 mm/hr (ref 0–40)
TOTAL PROTEIN: 6.6 g/dL (ref 6.0–8.5)
TSH: 2 u[IU]/mL (ref 0.450–4.500)
VITAMIN B 12: 1348 pg/mL — AB (ref 211–946)
Vit D, 25-Hydroxy: 8.8 ng/mL — ABNORMAL LOW (ref 30.0–100.0)

## 2013-10-04 NOTE — Progress Notes (Signed)
Quick Note:  What would be your recommendations for the patient? Difficult to call without any treatment plan ______

## 2013-10-05 ENCOUNTER — Encounter (INDEPENDENT_AMBULATORY_CARE_PROVIDER_SITE_OTHER): Payer: Self-pay

## 2013-10-05 ENCOUNTER — Ambulatory Visit
Admission: RE | Admit: 2013-10-05 | Discharge: 2013-10-05 | Disposition: A | Discharge status: Home / Self Care | Payer: Medicare Other | Source: Ambulatory Visit | Attending: Neurology | Admitting: Neurology

## 2013-10-05 DIAGNOSIS — M79604 Pain in right leg: Secondary | ICD-10-CM

## 2013-10-05 DIAGNOSIS — M545 Low back pain, unspecified: Secondary | ICD-10-CM

## 2013-10-05 DIAGNOSIS — J439 Emphysema, unspecified: Secondary | ICD-10-CM

## 2013-10-05 DIAGNOSIS — M79605 Pain in left leg: Secondary | ICD-10-CM

## 2013-10-05 DIAGNOSIS — M79609 Pain in unspecified limb: Secondary | ICD-10-CM

## 2013-10-06 ENCOUNTER — Telehealth: Payer: Self-pay | Admitting: *Deleted

## 2013-10-06 NOTE — Telephone Encounter (Signed)
Left a message for the patient to call back concerning her lab results.  Patient's Vitamin D level was 8.8 and normal range is 30-100, per Dr. Brett Fairy would like for her to take Vitamin D 2000 units twice a day for 1 month then decrease to 2000 units daily.

## 2013-10-11 NOTE — Telephone Encounter (Signed)
Patient was given her results and verbalized understanding.

## 2013-10-12 ENCOUNTER — Telehealth: Payer: Self-pay | Admitting: Neurology

## 2013-10-12 NOTE — Telephone Encounter (Signed)
FYI--Melinda w/[facility] calling to advise of appt @ their office--patient scheduled 10-14-13 @ 11am--patient is aware of appt.

## 2013-10-13 NOTE — Telephone Encounter (Signed)
Noted  

## 2013-10-14 ENCOUNTER — Other Ambulatory Visit (HOSPITAL_COMMUNITY): Payer: Medicare Other

## 2013-10-21 ENCOUNTER — Ambulatory Visit (HOSPITAL_COMMUNITY)
Admission: RE | Admit: 2013-10-21 | Discharge: 2013-10-21 | Disposition: A | Discharge status: Home / Self Care | Payer: Medicare Other | Source: Ambulatory Visit | Attending: Vascular Surgery | Admitting: Vascular Surgery

## 2013-10-21 ENCOUNTER — Other Ambulatory Visit: Payer: Self-pay | Admitting: Neurology

## 2013-10-21 DIAGNOSIS — M79609 Pain in unspecified limb: Secondary | ICD-10-CM | POA: Insufficient documentation

## 2013-10-21 DIAGNOSIS — M79606 Pain in leg, unspecified: Secondary | ICD-10-CM

## 2013-11-01 ENCOUNTER — Encounter: Payer: Self-pay | Admitting: Adult Health

## 2013-11-01 ENCOUNTER — Ambulatory Visit (INDEPENDENT_AMBULATORY_CARE_PROVIDER_SITE_OTHER): Payer: Medicare Other | Admitting: Adult Health

## 2013-11-01 VITALS — BP 135/95 | HR 89 | Ht 63.75 in | Wt 113.0 lb

## 2013-11-01 DIAGNOSIS — M48061 Spinal stenosis, lumbar region without neurogenic claudication: Secondary | ICD-10-CM | POA: Insufficient documentation

## 2013-11-01 NOTE — Progress Notes (Signed)
I agree with the assessment and plan as directed by NP .The patient is known to me .   Edgel Degnan, MD  

## 2013-11-01 NOTE — Progress Notes (Signed)
PATIENT: Natasha [name] DOB: [birth year]  REASON FOR VISIT: follow up HISTORY FROM: patient  HISTORY OF PRESENT ILLNESS: Natasha [name] is a 68 year old black female with a history of bilateral leg pain. She returns today for follow-up. At the last visit an MRI of the thoracic and lumbar spine was ordered. It showed moderate to severe left foraminal stenosis at the L5-S1 level. Patient states that she has continued to have pain in her legs but it has improved some since then. An ultrasound of the lower legs was unremarkable. She takes Vicodin which she states offers relief when she is having severe pain. In the past, patient has used flexeril. Patient confirms that she gets the most relief when she is bent forward. Patient fell back in February on the ice and noticed that after that is when her back really starting bothering her. No new medical issues since the last visit.   REVIEW OF SYSTEMS: Full 14 system review of systems performed and notable only for:  Constitutional: activity change, appetite change, unexpected wt change Eyes: N/A Ear/Nose/Throat: N/A  Skin: moles and itching  Cardiovascular: leg swelling  Respiratory: wheezing and shortness of breath Gastrointestinal: abdominal pain, diarrhea, nausea Genitourinary: N/A Hematology/Lymphatic: N/A  Endocrine: N/A Musculoskeletal:joint pain, back pain, muscles cramps, walking difficulty Allergy/Immunology: N/A  Neurological: N/A Psychiatric: N/A Sleep: daytime sleepiness   ALLERGIES: Allergies  Allergen Reactions  . Mercaptopurine Other (See Comments)    Unknown reaction  . Remicade [Infliximab] Other (See Comments)    Unknown reaction    HOME MEDICATIONS: Outpatient Prescriptions Prior to Visit  Medication Sig Dispense Refill  . acetaminophen (TYLENOL) 500 MG tablet Take 500 mg by mouth every 6 (six) hours as needed. For severe pain      . albuterol (PROVENTIL HFA;VENTOLIN HFA) 108 (90 BASE) MCG/ACT inhaler Inhale 2  puffs into the lungs every 6 (six) hours as needed for wheezing or shortness of breath.      . calcitRIOL (ROCALTROL) 0.5 MCG capsule Take 0.5 mcg by mouth daily.      . cyanocobalamin (,VITAMIN B-12,) 1000 MCG/ML injection Inject 1,000 mcg into the muscle every 30 (thirty) days. Last injection was 04/28/12      . cyclobenzaprine (FLEXERIL) 10 MG tablet Take 10 mg by mouth. One tablet as needed.      [name] esomeprazole (NEXIUM) 40 MG capsule Take 40 mg by mouth daily at 12 noon. 30 minutes prior to breakfast      . fluticasone (FLONASE) 50 MCG/ACT nasal spray Place 2 sprays into both nostrils daily as needed for allergies or rhinitis.      . folic acid (FOLVITE) 1 MG tablet Take 1 mg by mouth daily.      . furosemide (LASIX) 40 MG tablet Take 40 mg by mouth 2 (two) times daily.      [name] HYDROcodone-acetaminophen (NORCO/VICODIN) 5-325 MG per tablet Take 1-2 tablets by mouth every 6 (six) hours as needed for moderate pain.  20 tablet  0  . levothyroxine (SYNTHROID, LEVOTHROID) 125 MCG tablet Take 125 mcg by mouth daily.      . potassium chloride (KLOR-CON) 20 MEQ packet Take 20 mEq by mouth 3 (three) times daily.      . predniSONE (DELTASONE) 5 MG tablet Take 5 mg by mouth daily with breakfast.      . promethazine (PHENERGAN) 25 MG tablet Take by mouth every 6 (six) hours as needed for nausea or vomiting. Taking 1-2 tablets      .  QVAR 80 MCG/ACT inhaler as needed.      . sodium bicarbonate 650 MG tablet Take 650 mg by mouth 3 (three) times daily.      [name] SPIRIVA HANDIHALER 18 MCG inhalation capsule as needed.       No facility-administered medications prior to visit.    PAST MEDICAL HISTORY: Past Medical History  Diagnosis Date  . Chronic kidney disease   . GERD (gastroesophageal reflux disease)   . Crohn's colitis   . CKD (chronic kidney disease) 06/09/2012  . Hypothyroidism 06/10/2012  . Arthritis   . Osteoporosis   . Hypertension   . Plantar warts   . Postmenopausal   . Glaucoma   .  Bulging disc     L3-L4  . Atypical chest pain   . Emphysema/COPD     PAST SURGICAL HISTORY: Past Surgical History  Procedure Laterality Date  . Colon resection    . Esophagogastroduodenoscopy  03/04/2012    Procedure: ESOPHAGOGASTRODUODENOSCOPY (EGD);  Surgeon: Garlan Fair, MD;  Location: [facility];  Service: Endoscopy;  Laterality: N/A;  . Flexible sigmoidoscopy  03/04/2012    Procedure: FLEXIBLE SIGMOIDOSCOPY;  Surgeon: Garlan Fair, MD;  Location: [facility];  Service: Endoscopy;  Laterality: N/A;  . Abdominal hysterectomy    . Appendectomy    . Thyroidectomy      FAMILY HISTORY: Family History  Problem Relation Age of Onset  . Hypertension Father   . Diverticulitis Mother   . COPD Mother   . Hypertension Mother   . Colon cancer Brother   . Diabetes Brother   . Thyroid disease Brother     SOCIAL HISTORY: History   Social History  . Marital Status: Married    Spouse Name: [name]    Number of Children: 3  . Years of Education: N/A   Occupational History  .     Social History Main Topics  . Smoking status: Former Smoker -- 0.25 packs/day for 15 years  . Smokeless tobacco: Never Used     Comment: Quit smoking in 2003  . Alcohol Use: 1.2 oz/week    2 Glasses of wine per week     Comment: 1-2 drinks monthly  . Drug Use: No  . Sexual Activity: No   Other Topics Concern  . Not on file   Social History Narrative   Patient is married [name]) and lives at home with her husband.   Patient has three children.   Patient is retired on disability.   Patient drinks two cups of caffeine daily.   Patient is right-handed.               PHYSICAL EXAM  Filed Vitals:   11/01/13 0932  Height: 5' 3.75" (1.619 m)  Weight: 113 lb (51.256 kg)   Body mass index is 19.55 kg/(m^2).  Generalized: Well developed, in no acute distress   Neurological examination  Mentation: Alert oriented to time, place, history taking. Follows all commands speech and  language fluent Motor: The motor testing reveals 5 over 5 strength of all 4 extremities. Good symmetric motor tone is noted throughout.  Sensory: Sensory testing is intact to soft touch on all 4 extremities. No evidence of extinction is noted.  Coordination: Cerebellar testing reveals good finger-nose-finger and heel-to-shin bilaterally.  Gait and station: Gait is normal. Tandem gait is normal. Romberg is negative. No drift is seen.  Reflexes: Deep tendon reflexes are symmetric and normal bilaterally.    DIAGNOSTIC DATA (LABS, IMAGING, TESTING) - I  reviewed patient records, labs, notes, testing and imaging myself where available.  Lab Results  Component Value Date   WBC 6.6 05/16/2013   HGB 9.2* 05/16/2013   HCT 27.6* 05/16/2013   MCV 94.5 05/16/2013   PLT 96* 05/16/2013      Component Value Date/Time   NA 144 05/18/2013 0458   K 4.0 05/18/2013 0458   CL 105 05/18/2013 0458   CO2 33* 05/18/2013 0458   GLUCOSE 73 05/18/2013 0458   BUN 20 05/18/2013 0458   CREATININE 2.11* 05/18/2013 0458   CALCIUM 7.6* 05/18/2013 0458   PROT 6.6 09/28/2013 1059   PROT 5.5* 05/13/2013 0630   ALBUMIN 2.7* 05/13/2013 0630   AST 14 05/13/2013 0630   ALT 10 05/13/2013 0630   ALKPHOS 205* 05/13/2013 0630   BILITOT 0.4 05/13/2013 0630   GFRNONAA 23* 05/18/2013 0458   GFRAA 27* 05/18/2013 0458     Lab Results  Component Value Date   VITAMINB12 1348* 09/28/2013   Lab Results  Component Value Date   TSH 2.000 09/28/2013      ASSESSMENT AND PLAN 68 y.o. year old female  has a past medical history of Chronic kidney disease; GERD (gastroesophageal reflux disease); Crohn's colitis; CKD (chronic kidney disease) (06/09/2012); Hypothyroidism (06/10/2012); Arthritis; Osteoporosis; Hypertension; Plantar warts; Postmenopausal; Glaucoma; Bulging disc; Atypical chest pain; and Emphysema/COPD. here with   1. Spinal stenosis of lumbar region  Patient has moderate to severe stenosis in the lumbar region. I  suggested that she speak to a neurosurgeon. Patient agrees, she would like to see what her options are at this point. I will make a referral.  Patient is not interested in physical therapy at this time. She will continue to use Vicodin for severe pain.  She should follow up in 4-5 months or sooner if needed.   Ward Givens, MSN, NP-C 11/01/2013, 9:33 AM [facility] [address] [place], [place] [address] [phone]  Note: This document was prepared with digital dictation and possible smart phrase technology. Any transcriptional errors that result from this process are unintentional.

## 2013-11-01 NOTE — Patient Instructions (Signed)
Spinal Stenosis Spinal stenosis is when the open spaces between the bones of your spine (vertebrae) get smaller (narrow). It is caused by bone pushing on the open spaces of your spine. This puts pressure on your spine and the nerves in your spine. You may be given medicine to lessen the puffiness (inflammation) in your nerves. Other times, surgery is needed. HOME CARE  Change positions when you sit, stand, and lie. This can help take pressure off your nerves.  Do exercises as told by your doctor to strengthen the middle part of your body.  Lose weight if your doctor suggests it. This takes pressure off your spine.  Take all medicine as told by your doctor. MAKE SURE YOU:  Understand these instructions.  Will watch your condition.  Will get help right away if you are not doing well or get worse. Document Released: 09/05/2010 Document Revised: 01/12/2013 Document Reviewed: 08/20/2012 [facility] Patient Information 2014 [place].

## 2014-02-13 ENCOUNTER — Other Ambulatory Visit: Payer: Self-pay | Admitting: Gastroenterology

## 2014-03-03 ENCOUNTER — Encounter: Payer: Self-pay | Admitting: Adult Health

## 2014-03-03 ENCOUNTER — Ambulatory Visit (INDEPENDENT_AMBULATORY_CARE_PROVIDER_SITE_OTHER): Payer: Medicare Other | Admitting: Adult Health

## 2014-03-03 ENCOUNTER — Encounter (INDEPENDENT_AMBULATORY_CARE_PROVIDER_SITE_OTHER): Payer: Self-pay

## 2014-03-03 VITALS — BP 133/77 | HR 88 | Ht 64.0 in | Wt 110.0 lb

## 2014-03-03 DIAGNOSIS — E559 Vitamin D deficiency, unspecified: Secondary | ICD-10-CM

## 2014-03-03 DIAGNOSIS — M48061 Spinal stenosis, lumbar region without neurogenic claudication: Secondary | ICD-10-CM

## 2014-03-03 DIAGNOSIS — M79605 Pain in left leg: Secondary | ICD-10-CM

## 2014-03-03 DIAGNOSIS — M79604 Pain in right leg: Secondary | ICD-10-CM

## 2014-03-03 DIAGNOSIS — M4806 Spinal stenosis, lumbar region: Secondary | ICD-10-CM

## 2014-03-03 NOTE — Progress Notes (Signed)
I agree with the assessment and plan as directed by NP .The patient is known to me .   Bo Teicher, MD  

## 2014-03-03 NOTE — Progress Notes (Signed)
PATIENT: Natasha [name] DOB: [birth year]  REASON FOR VISIT: follow up HISTORY FROM: patient  HISTORY OF PRESENT ILLNESS: Natasha [name] is a 68 year old female with a history of spinal stenosis in the lumbar region causes bilateral leg pain. She returns today for follow-up. At the last visit she was referred to neurosurgery regarding the stenosis. She states that she never followed up with neurosurgery. She continues to have pain in her legs. She continues to take Vicodin for the pain in her legs. She denies numbness or tingling in the legs but describes it has an achy pain. She states her pain hurts down to her joints. She feels like the bones hurt. She also take tylenol arthritis for her pain and that helps as well. She has been to a rheumatologist who ruled out joint disease. She also has been to an arthritis specialist who told her she did not have arthritis per the patient. Patient did have a low Vitamin D level at her original visit with Dr. Brett Fairy since this she has been taking Vitamin D supplements.                             HISTORY: 68 year old black female with a history of bilateral leg pain. She returns today for follow-up. At the last visit an MRI of the thoracic and lumbar spine was ordered. It showed moderate to severe left foraminal stenosis at the L5-S1 level. Patient states that she has continued to have pain in her legs but it has improved some since then. An ultrasound of the lower legs was unremarkable. She takes Vicodin which she states offers relief when she is having severe pain. In the past, patient has used flexeril. Patient confirms that she gets the most relief when she is bent forward. Patient fell back in February on the ice and noticed that after that is when her back really starting bothering her. No new medical issues since the last visit  REVIEW OF SYSTEMS: Full 14 system review of systems performed and notable only for:  Constitutional: Appetite change,  fatigue, unexpected weight change Eyes: Eye discharge, eye itching Ear/Nose/Throat: Runny nose Skin: Moles, itching Cardiovascular: N/A  Respiratory: Shortness of breath  Gastrointestinal: Swollen abdomen, abdominal pain, diarrhea, nausea, rectal pain, incontinence of bowels Genitourinary: Frequency of urination Hematology/Lymphatic: Bruise/bleed easily, anemia Endocrine: Excessive thirst  Musculoskeletal: Joint pain, joint swelling, back pain, aching muscles, muscle cramps, walking difficulty Allergy/Immunology: Environmental allergies  Neurological: Numbness  Psychiatric: N/A Sleep: Daytime sleepiness  ALLERGIES: Allergies  Allergen Reactions  . Mercaptopurine Other (See Comments)    Unknown reaction  . Remicade [Infliximab] Other (See Comments)    Unknown reaction    HOME MEDICATIONS: Outpatient Prescriptions Prior to Visit  Medication Sig Dispense Refill  . acetaminophen (TYLENOL) 500 MG tablet Take 500 mg by mouth every 6 (six) hours as needed. For severe pain      . albuterol (PROVENTIL HFA;VENTOLIN HFA) 108 (90 BASE) MCG/ACT inhaler Inhale 2 puffs into the lungs every 6 (six) hours as needed for wheezing or shortness of breath.      . calcitRIOL (ROCALTROL) 0.5 MCG capsule Take 0.5 mcg by mouth daily.      . Cholecalciferol (VITAMIN D) 2000 UNITS tablet Take 2,000 Units by mouth 2 (two) times daily.      . cyanocobalamin (,VITAMIN B-12,) 1000 MCG/ML injection Inject 1,000 mcg into the muscle every 30 (thirty) days. Last injection  was 04/28/12      . cyclobenzaprine (FLEXERIL) 10 MG tablet Take 10 mg by mouth. One tablet as needed.      . fluticasone (FLONASE) 50 MCG/ACT nasal spray Place 2 sprays into both nostrils daily as needed for allergies or rhinitis.      . folic acid (FOLVITE) 1 MG tablet Take 1 mg by mouth daily.      . furosemide (LASIX) 40 MG tablet Take 40 mg by mouth 2 (two) times daily.      [name] HYDROcodone-acetaminophen (NORCO/VICODIN) 5-325 MG per tablet Take  1-2 tablets by mouth every 6 (six) hours as needed for moderate pain.  20 tablet  0  . levothyroxine (SYNTHROID, LEVOTHROID) 125 MCG tablet Take 125 mcg by mouth daily.      . potassium chloride (KLOR-CON) 20 MEQ packet Take 20 mEq by mouth 3 (three) times daily.      . predniSONE (DELTASONE) 5 MG tablet Take 5 mg by mouth daily with breakfast.      . promethazine (PHENERGAN) 25 MG tablet Take by mouth every 6 (six) hours as needed for nausea or vomiting. Taking 1-2 tablets      . QVAR 80 MCG/ACT inhaler as needed.      [name] SPIRIVA HANDIHALER 18 MCG inhalation capsule as needed.       No facility-administered medications prior to visit.    PAST MEDICAL HISTORY: Past Medical History  Diagnosis Date  . Chronic kidney disease   . GERD (gastroesophageal reflux disease)   . Crohn's colitis   . CKD (chronic kidney disease) 06/09/2012  . Hypothyroidism 06/10/2012  . Arthritis   . Osteoporosis   . Hypertension   . Plantar warts   . Postmenopausal   . Glaucoma   . Bulging disc     L3-L4  . Atypical chest pain   . Emphysema/COPD     PAST SURGICAL HISTORY: Past Surgical History  Procedure Laterality Date  . Colon resection    . Esophagogastroduodenoscopy  03/04/2012    Procedure: ESOPHAGOGASTRODUODENOSCOPY (EGD);  Surgeon: Garlan Fair, MD;  Location: [facility];  Service: Endoscopy;  Laterality: N/A;  . Flexible sigmoidoscopy  03/04/2012    Procedure: FLEXIBLE SIGMOIDOSCOPY;  Surgeon: Garlan Fair, MD;  Location: [facility];  Service: Endoscopy;  Laterality: N/A;  . Abdominal hysterectomy    . Appendectomy    . Thyroidectomy      FAMILY HISTORY: Family History  Problem Relation Age of Onset  . Hypertension Father   . Diverticulitis Mother   . COPD Mother   . Hypertension Mother   . Colon cancer Brother   . Diabetes Brother   . Thyroid disease Brother     SOCIAL HISTORY: History   Social History  . Marital Status: Married    Spouse Name: [name]    Number of  Children: 3  . Years of Education: 12   Occupational History  . Retired    Social History Main Topics  . Smoking status: Former Smoker -- 0.25 packs/day for 15 years    Types: Cigarettes  . Smokeless tobacco: Never Used     Comment: Quit smoking in 2003  . Alcohol Use: 1.2 oz/week    2 Glasses of wine per week     Comment: 1-2 drinks monthly  . Drug Use: No  . Sexual Activity: No   Other Topics Concern  . Not on file   Social History Narrative   Patient is married [name]) and lives at home  with her husband.   Patient has three children.   Patient is retired on disability.   Patient drinks two cups of caffeine daily.   Patient is right-handed.               PHYSICAL EXAM  Filed Vitals:   03/03/14 1002  BP: 133/77  Pulse: 88  Height: 5' 4"  (1.626 m)  Weight: 110 lb (49.896 kg)   Body mass index is 18.87 kg/(m^2).  Generalized: Well developed, in no acute distress   Neurological examination  Mentation: Alert oriented to time, place, history taking. Follows all commands speech and language fluent Cranial nerve II-XII: Pupils were equal round reactive to light. Extraocular movements were full, visual field were full on confrontational test. Facial sensation and strength were normal.  Head turning and shoulder shrug  were normal and symmetric. Motor: The motor testing reveals 5 over 5 strength of all 4 extremities. Good symmetric motor tone is noted throughout.  Sensory: Sensory testing is intact to soft touch on all 4 extremities. No evidence of extinction is noted.  Coordination: Cerebellar testing reveals good finger-nose-finger and heel-to-shin bilaterally.  Gait and station: Gait is wide based. Tandem gait is normal. Romberg is negative. No drift is seen.  Reflexes: Deep tendon reflexes are symmetric and normal bilaterally.    DIAGNOSTIC DATA (LABS, IMAGING, TESTING) - I reviewed patient records, labs, notes, testing and imaging myself where available.  Lab  Results  Component Value Date   WBC 6.6 05/16/2013   HGB 9.2* 05/16/2013   HCT 27.6* 05/16/2013   MCV 94.5 05/16/2013   PLT 96* 05/16/2013      Component Value Date/Time   NA 144 05/18/2013 0458   K 4.0 05/18/2013 0458   CL 105 05/18/2013 0458   CO2 33* 05/18/2013 0458   GLUCOSE 73 05/18/2013 0458   BUN 20 05/18/2013 0458   CREATININE 2.11* 05/18/2013 0458   CALCIUM 7.6* 05/18/2013 0458   PROT 6.6 09/28/2013 1059   PROT 5.5* 05/13/2013 0630   ALBUMIN 2.7* 05/13/2013 0630   AST 14 05/13/2013 0630   ALT 10 05/13/2013 0630   ALKPHOS 205* 05/13/2013 0630   BILITOT 0.4 05/13/2013 0630   GFRNONAA 23* 05/18/2013 0458   GFRAA 27* 05/18/2013 0458    Lab Results  Component Value Date   VITAMINB12 1348* 09/28/2013   Lab Results  Component Value Date   TSH 2.000 09/28/2013      ASSESSMENT AND PLAN 68 y.o. year old female  has a past medical history of Chronic kidney disease; GERD (gastroesophageal reflux disease); Crohn's colitis; CKD (chronic kidney disease) (06/09/2012); Hypothyroidism (06/10/2012); Arthritis; Osteoporosis; Hypertension; Plantar warts; Postmenopausal; Glaucoma; Bulging disc; Atypical chest pain; and Emphysema/COPD. here with:  1. Bilateral leg pain 2. Lumbar stenosis 3. Vitamin D deficiency  Patient continues to have bilateral leg pain. She was referred to neurosurgery and according to our records an appointment was set up with Dr. Sherwood Gambler. She is unsure if she actually went to this appointment. I advised the patient that it is my recommendation that she follow-up with Dr. Sherwood Gambler if she has not already. Patient states she will check her material at home to see if she went to the appointment. She continues to use Vicodin and tylenol arthritis for her discomfort. I will recheck her vitamin D level today. She should follow-up with [place] in 6 months or sooner if needed.   Ward Givens, MSN, NP-C 03/03/2014, 10:20 AM [facility] [address]  [place], [place] [address] [phone]  Note: This document was prepared with digital dictation and possible smart phrase technology. Any transcriptional errors that result from this process are unintentional.

## 2014-03-03 NOTE — Patient Instructions (Signed)
Spinal Stenosis Spinal stenosis is an abnormal narrowing of the canals of your spine (vertebrae). CAUSES  Spinal stenosis is caused by areas of bone pushing into the central canals of your vertebrae. This condition can be present at birth (congenital). It also may be caused by arthritic deterioration of your vertebrae (spinal degeneration).  SYMPTOMS   Pain that is generally worse with activities, particularly standing and walking.  Numbness, tingling, hot or cold sensations, weakness, or weariness in your legs.  Frequent episodes of falling.  A foot-slapping gait that leads to muscle weakness. DIAGNOSIS  Spinal stenosis is diagnosed with the use of magnetic resonance imaging (MRI) or computed tomography (CT). TREATMENT  Initial therapy for spinal stenosis focuses on the management of the pain and other symptoms associated with the condition. These therapies include:  Practicing postural changes to lessen pressure on your nerves.  Exercises to strengthen the core of your body.  Loss of excess body weight.  The use of nonsteroidal anti-inflammatory medicines to reduce swelling and inflammation in your nerves. When therapies to manage pain are not successful, surgery to treat spinal stenosis may be recommended. This surgery involves removing excess bone, which puts pressure on your nerve roots. During this surgery (laminectomy), the posterior boney arch (lamina) and excess bone around the facet joints are removed. Document Released: 08/02/2003 Document Revised: 09/26/2013 Document Reviewed: 08/20/2012 [facility] Patient Information 2015 [place], [place]. This information is not intended to replace advice given to you by your health care provider. Make sure you discuss any questions you have with your health care provider.

## 2014-03-04 LAB — VITAMIN D 25 HYDROXY (VIT D DEFICIENCY, FRACTURES): Vit D, 25-Hydroxy: 11 ng/mL — ABNORMAL LOW (ref 30.0–100.0)

## 2014-03-06 ENCOUNTER — Telehealth: Payer: Self-pay | Admitting: Adult Health

## 2014-03-06 MED ORDER — VITAMIN D 50 MCG (2000 UT) PO TABS: 2000.0000 [IU] | ORAL_TABLET | Freq: Three times a day (TID) | ORAL | Status: DC

## 2014-03-06 NOTE — Telephone Encounter (Signed)
I called the patient. Her Vitamin D level remains low. I will increase her Vitamin D supplement from BID to TID. Patient verbalized understanding. She also wanted to let me know she did see a neurosurgeon Dr. Diamantina Monks. At this time they did not feel like she was a candidate for surgery or any interventions per the patient.

## 2014-03-21 ENCOUNTER — Ambulatory Visit
Admission: RE | Admit: 2014-03-21 | Discharge: 2014-03-21 | Disposition: A | Discharge status: Home / Self Care | Payer: Medicare Other | Source: Ambulatory Visit | Attending: Internal Medicine | Admitting: Internal Medicine

## 2014-03-21 ENCOUNTER — Other Ambulatory Visit: Payer: Self-pay | Admitting: Internal Medicine

## 2014-03-21 DIAGNOSIS — M545 Low back pain: Secondary | ICD-10-CM

## 2014-03-29 ENCOUNTER — Inpatient Hospital Stay (HOSPITAL_COMMUNITY)
Admission: EM | Admit: 2014-03-29 | Discharge: 2014-04-07 | DRG: 682 | Disposition: A | Discharge status: Home / Self Care | Payer: Medicare Other | Attending: Internal Medicine | Admitting: Internal Medicine

## 2014-03-29 ENCOUNTER — Encounter (HOSPITAL_COMMUNITY): Payer: Self-pay | Admitting: *Deleted

## 2014-03-29 ENCOUNTER — Other Ambulatory Visit: Payer: Self-pay | Admitting: Internal Medicine

## 2014-03-29 ENCOUNTER — Ambulatory Visit
Admission: RE | Admit: 2014-03-29 | Discharge: 2014-03-29 | Disposition: A | Discharge status: Home / Self Care | Payer: Medicare Other | Source: Ambulatory Visit | Attending: Internal Medicine | Admitting: Internal Medicine

## 2014-03-29 ENCOUNTER — Emergency Department (HOSPITAL_COMMUNITY): Payer: Medicare Other

## 2014-03-29 DIAGNOSIS — E878 Other disorders of electrolyte and fluid balance, not elsewhere classified: Secondary | ICD-10-CM | POA: Diagnosis present

## 2014-03-29 DIAGNOSIS — J439 Emphysema, unspecified: Secondary | ICD-10-CM | POA: Insufficient documentation

## 2014-03-29 DIAGNOSIS — N184 Chronic kidney disease, stage 4 (severe): Secondary | ICD-10-CM | POA: Diagnosis present

## 2014-03-29 DIAGNOSIS — Z833 Family history of diabetes mellitus: Secondary | ICD-10-CM

## 2014-03-29 DIAGNOSIS — I82402 Acute embolism and thrombosis of unspecified deep veins of left lower extremity: Secondary | ICD-10-CM | POA: Diagnosis present

## 2014-03-29 DIAGNOSIS — E038 Other specified hypothyroidism: Secondary | ICD-10-CM | POA: Insufficient documentation

## 2014-03-29 DIAGNOSIS — D696 Thrombocytopenia, unspecified: Secondary | ICD-10-CM | POA: Diagnosis present

## 2014-03-29 DIAGNOSIS — K5010 Crohn's disease of large intestine without complications: Secondary | ICD-10-CM | POA: Diagnosis present

## 2014-03-29 DIAGNOSIS — Z87891 Personal history of nicotine dependence: Secondary | ICD-10-CM | POA: Diagnosis not present

## 2014-03-29 DIAGNOSIS — Z8249 Family history of ischemic heart disease and other diseases of the circulatory system: Secondary | ICD-10-CM

## 2014-03-29 DIAGNOSIS — E43 Unspecified severe protein-calorie malnutrition: Secondary | ICD-10-CM | POA: Diagnosis present

## 2014-03-29 DIAGNOSIS — E039 Hypothyroidism, unspecified: Secondary | ICD-10-CM | POA: Diagnosis present

## 2014-03-29 DIAGNOSIS — E860 Dehydration: Secondary | ICD-10-CM | POA: Diagnosis present

## 2014-03-29 DIAGNOSIS — Z888 Allergy status to other drugs, medicaments and biological substances status: Secondary | ICD-10-CM

## 2014-03-29 DIAGNOSIS — N200 Calculus of kidney: Secondary | ICD-10-CM | POA: Diagnosis present

## 2014-03-29 DIAGNOSIS — N189 Chronic kidney disease, unspecified: Secondary | ICD-10-CM

## 2014-03-29 DIAGNOSIS — N2581 Secondary hyperparathyroidism of renal origin: Secondary | ICD-10-CM | POA: Diagnosis present

## 2014-03-29 DIAGNOSIS — M25519 Pain in unspecified shoulder: Secondary | ICD-10-CM

## 2014-03-29 DIAGNOSIS — M25511 Pain in right shoulder: Secondary | ICD-10-CM | POA: Insufficient documentation

## 2014-03-29 DIAGNOSIS — D631 Anemia in chronic kidney disease: Secondary | ICD-10-CM | POA: Diagnosis present

## 2014-03-29 DIAGNOSIS — E872 Acidosis, unspecified: Secondary | ICD-10-CM | POA: Diagnosis present

## 2014-03-29 DIAGNOSIS — I129 Hypertensive chronic kidney disease with stage 1 through stage 4 chronic kidney disease, or unspecified chronic kidney disease: Secondary | ICD-10-CM | POA: Diagnosis present

## 2014-03-29 DIAGNOSIS — J441 Chronic obstructive pulmonary disease with (acute) exacerbation: Secondary | ICD-10-CM | POA: Diagnosis present

## 2014-03-29 DIAGNOSIS — N170 Acute kidney failure with tubular necrosis: Principal | ICD-10-CM | POA: Diagnosis present

## 2014-03-29 DIAGNOSIS — I5032 Chronic diastolic (congestive) heart failure: Secondary | ICD-10-CM | POA: Insufficient documentation

## 2014-03-29 DIAGNOSIS — Z9049 Acquired absence of other specified parts of digestive tract: Secondary | ICD-10-CM | POA: Diagnosis present

## 2014-03-29 DIAGNOSIS — Z7952 Long term (current) use of systemic steroids: Secondary | ICD-10-CM

## 2014-03-29 DIAGNOSIS — E876 Hypokalemia: Secondary | ICD-10-CM | POA: Diagnosis present

## 2014-03-29 DIAGNOSIS — I119 Hypertensive heart disease without heart failure: Secondary | ICD-10-CM | POA: Insufficient documentation

## 2014-03-29 DIAGNOSIS — Z825 Family history of asthma and other chronic lower respiratory diseases: Secondary | ICD-10-CM | POA: Diagnosis not present

## 2014-03-29 DIAGNOSIS — E870 Hyperosmolality and hypernatremia: Secondary | ICD-10-CM | POA: Diagnosis present

## 2014-03-29 DIAGNOSIS — R1084 Generalized abdominal pain: Secondary | ICD-10-CM

## 2014-03-29 DIAGNOSIS — R112 Nausea with vomiting, unspecified: Secondary | ICD-10-CM

## 2014-03-29 DIAGNOSIS — Z800 Family history of malignant neoplasm of digestive organs: Secondary | ICD-10-CM

## 2014-03-29 DIAGNOSIS — M810 Age-related osteoporosis without current pathological fracture: Secondary | ICD-10-CM | POA: Diagnosis present

## 2014-03-29 DIAGNOSIS — N179 Acute kidney failure, unspecified: Secondary | ICD-10-CM

## 2014-03-29 DIAGNOSIS — K219 Gastro-esophageal reflux disease without esophagitis: Secondary | ICD-10-CM | POA: Diagnosis present

## 2014-03-29 DIAGNOSIS — K5900 Constipation, unspecified: Secondary | ICD-10-CM | POA: Diagnosis present

## 2014-03-29 DIAGNOSIS — E861 Hypovolemia: Secondary | ICD-10-CM | POA: Diagnosis present

## 2014-03-29 DIAGNOSIS — I5042 Chronic combined systolic (congestive) and diastolic (congestive) heart failure: Secondary | ICD-10-CM | POA: Diagnosis present

## 2014-03-29 DIAGNOSIS — E559 Vitamin D deficiency, unspecified: Secondary | ICD-10-CM | POA: Diagnosis present

## 2014-03-29 DIAGNOSIS — H409 Unspecified glaucoma: Secondary | ICD-10-CM | POA: Diagnosis present

## 2014-03-29 DIAGNOSIS — E8729 Other acidosis: Secondary | ICD-10-CM | POA: Insufficient documentation

## 2014-03-29 LAB — CBC WITH DIFFERENTIAL/PLATELET
BASOS ABS: 0 10*3/uL (ref 0.0–0.1)
BASOS PCT: 0 % (ref 0–1)
EOS ABS: 0.1 10*3/uL (ref 0.0–0.7)
Eosinophils Relative: 1 % (ref 0–5)
HCT: 36.4 % (ref 36.0–46.0)
Hemoglobin: 11.9 g/dL — ABNORMAL LOW (ref 12.0–15.0)
Lymphocytes Relative: 29 % (ref 12–46)
Lymphs Abs: 2.8 10*3/uL (ref 0.7–4.0)
MCH: 31.2 pg (ref 26.0–34.0)
MCHC: 32.7 g/dL (ref 30.0–36.0)
MCV: 95.3 fL (ref 78.0–100.0)
MONOS PCT: 9 % (ref 3–12)
Monocytes Absolute: 0.9 10*3/uL (ref 0.1–1.0)
NEUTROS ABS: 5.8 10*3/uL (ref 1.7–7.7)
Neutrophils Relative %: 61 % (ref 43–77)
PLATELETS: 181 10*3/uL (ref 150–400)
RBC: 3.82 MIL/uL — ABNORMAL LOW (ref 3.87–5.11)
RDW: 15.8 % — AB (ref 11.5–15.5)
WBC: 9.6 10*3/uL (ref 4.0–10.5)

## 2014-03-29 LAB — LACTIC ACID, PLASMA: LACTIC ACID, VENOUS: 0.6 mmol/L (ref 0.5–2.2)

## 2014-03-29 LAB — I-STAT CHEM 8, ED
BUN: 70 mg/dL — ABNORMAL HIGH (ref 6–23)
CALCIUM ION: 0.99 mmol/L — AB (ref 1.13–1.30)
CHLORIDE: 123 meq/L — AB (ref 96–112)
Creatinine, Ser: 7.3 mg/dL — ABNORMAL HIGH (ref 0.50–1.10)
GLUCOSE: 87 mg/dL (ref 70–99)
HCT: 39 % (ref 36.0–46.0)
HEMOGLOBIN: 13.3 g/dL (ref 12.0–15.0)
Potassium: 2.8 mEq/L — CL (ref 3.7–5.3)
Sodium: 146 mEq/L (ref 137–147)
TCO2: 8 mmol/L (ref 0–100)

## 2014-03-29 LAB — BASIC METABOLIC PANEL
ANION GAP: 21 — AB (ref 5–15)
BUN: 60 mg/dL — AB (ref 6–23)
CHLORIDE: 115 meq/L — AB (ref 96–112)
CO2: 8 mEq/L — CL (ref 19–32)
Calcium: 6.6 mg/dL — ABNORMAL LOW (ref 8.4–10.5)
Creatinine, Ser: 5.94 mg/dL — ABNORMAL HIGH (ref 0.50–1.10)
GFR, EST AFRICAN AMERICAN: 8 mL/min — AB (ref >90)
GFR, EST NON AFRICAN AMERICAN: 7 mL/min — AB (ref >90)
Glucose, Bld: 86 mg/dL (ref 70–99)
POTASSIUM: 2.9 meq/L — AB (ref 3.7–5.3)
SODIUM: 144 meq/L (ref 137–147)

## 2014-03-29 LAB — ETHANOL: Alcohol, Ethyl (B): <11 mg/dL (ref 0–11)

## 2014-03-29 LAB — URINALYSIS, ROUTINE W REFLEX MICROSCOPIC
Bilirubin Urine: NEGATIVE
GLUCOSE, UA: NEGATIVE mg/dL
Ketones, ur: NEGATIVE mg/dL
Leukocytes, UA: NEGATIVE
Nitrite: NEGATIVE
Protein, ur: 30 mg/dL — AB
Specific Gravity, Urine: 1.008 (ref 1.005–1.030)
Urobilinogen, UA: 0.2 mg/dL (ref 0.0–1.0)
pH: 5 (ref 5.0–8.0)

## 2014-03-29 LAB — URINE MICROSCOPIC-ADD ON

## 2014-03-29 LAB — MAGNESIUM: Magnesium: 1 mg/dL — ABNORMAL LOW (ref 1.5–2.5)

## 2014-03-29 MED ORDER — FLUTICASONE PROPIONATE HFA 44 MCG/ACT IN AERO
2.0000 | INHALATION_SPRAY | Freq: Two times a day (BID) | RESPIRATORY_TRACT | Status: DC
  Administered 2014-03-31 – 2014-04-07 (×15): 2 via RESPIRATORY_TRACT
  Filled 2014-03-29 (×3): qty 10.6

## 2014-03-29 MED ORDER — LEVOTHYROXINE SODIUM 125 MCG PO TABS
125.0000 ug | ORAL_TABLET | Freq: Every day | ORAL | Status: DC
  Administered 2014-03-30 – 2014-04-07 (×9): 125 ug via ORAL
  Filled 2014-03-29 (×12): qty 1

## 2014-03-29 MED ORDER — PREDNISONE 5 MG PO TABS
5.0000 mg | ORAL_TABLET | Freq: Every day | ORAL | Status: DC
  Administered 2014-03-30 – 2014-04-07 (×9): 5 mg via ORAL
  Filled 2014-03-29 (×12): qty 1

## 2014-03-29 MED ORDER — TIOTROPIUM BROMIDE MONOHYDRATE 18 MCG IN CAPS: 18.0000 ug | ORAL_CAPSULE | Freq: Every day | RESPIRATORY_TRACT | Status: DC | PRN

## 2014-03-29 MED ORDER — SODIUM CHLORIDE 0.9 % IV BOLUS (SEPSIS)
1000.0000 mL | Freq: Once | INTRAVENOUS | Status: AC
  Administered 2014-03-29: 1000 mL via INTRAVENOUS

## 2014-03-29 MED ORDER — POTASSIUM CHLORIDE IN NACL 20-0.9 MEQ/L-% IV SOLN
INTRAVENOUS | Status: DC
  Administered 2014-03-29: 23:00:00 via INTRAVENOUS
  Filled 2014-03-29 (×5): qty 1000

## 2014-03-29 MED ORDER — HEPARIN SODIUM (PORCINE) 5000 UNIT/ML IJ SOLN
5000.0000 [IU] | Freq: Three times a day (TID) | INTRAMUSCULAR | Status: DC
  Administered 2014-03-29 – 2014-04-02 (×11): 5000 [IU] via SUBCUTANEOUS
  Filled 2014-03-29 (×14): qty 1

## 2014-03-29 MED ORDER — MORPHINE SULFATE 2 MG/ML IJ SOLN
2.0000 mg | INTRAMUSCULAR | Status: DC | PRN
  Administered 2014-03-29 – 2014-03-30 (×2): 2 mg via INTRAVENOUS
  Administered 2014-03-30 – 2014-04-01 (×3): 4 mg via INTRAVENOUS
  Administered 2014-04-01: 2 mg via INTRAVENOUS
  Administered 2014-04-01 – 2014-04-03 (×5): 4 mg via INTRAVENOUS
  Administered 2014-04-04 – 2014-04-06 (×3): 2 mg via INTRAVENOUS
  Filled 2014-03-29 (×2): qty 1
  Filled 2014-03-29: qty 2
  Filled 2014-03-29: qty 1
  Filled 2014-03-29: qty 2
  Filled 2014-03-29 (×3): qty 1
  Filled 2014-03-29 (×6): qty 2

## 2014-03-29 MED ORDER — MAGNESIUM SULFATE 2 GM/50ML IV SOLN
2.0000 g | Freq: Once | INTRAVENOUS | Status: AC
  Administered 2014-03-29: 1 g via INTRAVENOUS
  Filled 2014-03-29: qty 50

## 2014-03-29 MED ORDER — ONDANSETRON HCL 4 MG/2ML IJ SOLN
4.0000 mg | Freq: Four times a day (QID) | INTRAMUSCULAR | Status: DC | PRN
  Administered 2014-03-29 – 2014-04-05 (×9): 4 mg via INTRAVENOUS
  Filled 2014-03-29 (×9): qty 2

## 2014-03-29 MED ORDER — ONDANSETRON HCL 4 MG PO TABS
4.0000 mg | ORAL_TABLET | Freq: Four times a day (QID) | ORAL | Status: DC | PRN
  Filled 2014-03-29: qty 1

## 2014-03-29 MED ORDER — CALCITRIOL 0.5 MCG PO CAPS
0.5000 ug | ORAL_CAPSULE | Freq: Every day | ORAL | Status: DC
  Administered 2014-03-30 – 2014-04-07 (×9): 0.5 ug via ORAL
  Filled 2014-03-29 (×11): qty 1

## 2014-03-29 MED ORDER — ALBUTEROL SULFATE HFA 108 (90 BASE) MCG/ACT IN AERS: 2.0000 | INHALATION_SPRAY | Freq: Four times a day (QID) | RESPIRATORY_TRACT | Status: DC | PRN

## 2014-03-29 NOTE — ED Notes (Signed)
The pt has been sent from [facility] to be treated for abd pain and dehydration  She has abd pain since yesterday.  She had blood drawn today but no urine done.  She just went to the br

## 2014-03-29 NOTE — ED Provider Notes (Signed)
Patient here complaining of abdominal pain dehydration. Had a noncontrast CT which did not show any acute findings. Her laboratory studies is consistent with renal failure. She does have a history of renal insufficiency. Will consult nephrology and admit to medicine  Natasha Jacobsen, MD 03/29/14 2123

## 2014-03-29 NOTE — ED Provider Notes (Signed)
CSN: [id number]     Arrival date & time 03/29/14  1734 History   First MD Initiated Contact with Patient 03/29/14 2013     Chief Complaint  Patient presents with  . Abdominal Pain     (Consider location/radiation/quality/duration/timing/severity/associated sxs/prior Treatment) The history is provided by the patient. No language interpreter was used.  Natasha [name] is a 68 year old female with past medical history of chronic kidney disease, GERD, Crohn's colitis, glaucoma, COPD presenting to the emergency department as per recommendation from primary care provider. Patient reported that she's been experiencing abdominal pain over the past couple days with worsening yesterday localized to the center of the abdomen described as a constant gnawing pain increased yesterday. Reported that she had approximately 2 episodes of emesis yesterday and today - NB/NB. Reported that she's been having decreasing urination. Reported that she was seen and assessed by her primary care provider where CT abdomen and pelvis without contrast was performed identifying kidney stones. As per physician, reported the patient's labs showed that she was dehydrated and that patient needed to come to the ED to be assessed. Stated that she does have history of elevated creatinine and BUN - reported that she spoke with her physician regarding possible dialysis.denied chest pain, shortness of breath, difficulty breathing, melena, hematochezia, dysuria, leg swelling, dizziness. PCP Dr. Nena Jordan  Past Medical History  Diagnosis Date  . Chronic kidney disease   . GERD (gastroesophageal reflux disease)   . Crohn's colitis   . CKD (chronic kidney disease) 06/09/2012  . Hypothyroidism 06/10/2012  . Arthritis   . Osteoporosis   . Hypertension   . Plantar warts   . Postmenopausal   . Glaucoma   . Bulging disc     L3-L4  . Atypical chest pain   . Emphysema/COPD    Past Surgical History  Procedure Laterality Date  .  Colon resection    . Esophagogastroduodenoscopy  03/04/2012    Procedure: ESOPHAGOGASTRODUODENOSCOPY (EGD);  Surgeon: Garlan Fair, MD;  Location: [facility];  Service: Endoscopy;  Laterality: N/A;  . Flexible sigmoidoscopy  03/04/2012    Procedure: FLEXIBLE SIGMOIDOSCOPY;  Surgeon: Garlan Fair, MD;  Location: [facility];  Service: Endoscopy;  Laterality: N/A;  . Abdominal hysterectomy    . Appendectomy    . Thyroidectomy     Family History  Problem Relation Age of Onset  . Hypertension Father   . Diverticulitis Mother   . COPD Mother   . Hypertension Mother   . Colon cancer Brother   . Diabetes Brother   . Thyroid disease Brother    History  Substance Use Topics  . Smoking status: Former Smoker -- 0.25 packs/day for 15 years    Types: Cigarettes  . Smokeless tobacco: Never Used     Comment: Quit smoking in 2003  . Alcohol Use: 1.2 oz/week    2 Glasses of wine per week     Comment: 1-2 drinks monthly   OB History    No data available     Review of Systems  Constitutional: Negative for fever and chills.  Eyes: Negative for visual disturbance.  Respiratory: Negative for chest tightness and shortness of breath.   Cardiovascular: Negative for chest pain.  Gastrointestinal: Positive for nausea, vomiting and abdominal pain. Negative for diarrhea, constipation, blood in stool and anal bleeding.  Genitourinary: Negative for dysuria, vaginal bleeding, vaginal discharge and vaginal pain.  Neurological: Negative for dizziness, weakness and headaches.      Allergies  Mercaptopurine  and Remicade  Home Medications   Prior to Admission medications   Medication Sig Start Date End Date Taking? Authorizing Provider  acetaminophen (TYLENOL) 500 MG tablet Take 500 mg by mouth every 6 (six) hours as needed. For severe pain   Yes Historical Provider, MD  albuterol (PROVENTIL HFA;VENTOLIN HFA) 108 (90 BASE) MCG/ACT inhaler Inhale 2 puffs into the lungs every 6 (six) hours  as needed for wheezing or shortness of breath.   Yes Historical Provider, MD  calcitRIOL (ROCALTROL) 0.5 MCG capsule Take 0.5 mcg by mouth daily.   Yes Historical Provider, MD  Cholecalciferol (VITAMIN D) 2000 UNITS tablet Take 1 tablet (2,000 Units total) by mouth 3 (three) times daily. 03/06/14  Yes Ward Givens, NP  cyanocobalamin (,VITAMIN B-12,) 1000 MCG/ML injection Inject 1,000 mcg into the muscle every 30 (thirty) days. Last injection was 04/28/12   Yes Historical Provider, MD  cyclobenzaprine (FLEXERIL) 10 MG tablet Take 10 mg by mouth as needed for muscle spasms.    Yes Historical Provider, MD  folic acid (FOLVITE) 1 MG tablet Take 1 mg by mouth daily.   Yes Historical Provider, MD  furosemide (LASIX) 40 MG tablet Take 40 mg by mouth daily as needed for edema.    Yes Historical Provider, MD  levothyroxine (SYNTHROID, LEVOTHROID) 125 MCG tablet Take 125 mcg by mouth daily.   Yes Historical Provider, MD  potassium chloride (KLOR-CON) 20 MEQ packet Take 20 mEq by mouth 3 (three) times daily.   Yes Historical Provider, MD  predniSONE (DELTASONE) 5 MG tablet Take 5 mg by mouth daily with breakfast.   Yes Historical Provider, MD  promethazine (PHENERGAN) 25 MG tablet Take by mouth every 6 (six) hours as needed for nausea or vomiting. Taking 1-2 tablets   Yes Historical Provider, MD  QVAR 80 MCG/ACT inhaler Inhale 2 puffs into the lungs as needed (for wheezing).  08/27/13  Yes Historical Provider, MD  SPIRIVA HANDIHALER 18 MCG inhalation capsule Place 18 mcg into inhaler and inhale as needed (for COPD).  09/07/13  Yes Historical Provider, MD  fluticasone (FLONASE) 50 MCG/ACT nasal spray Place 2 sprays into both nostrils daily as needed for allergies or rhinitis.    Historical Provider, MD  HYDROcodone-acetaminophen (NORCO/VICODIN) 5-325 MG per tablet Take 1-2 tablets by mouth every 6 (six) hours as needed for moderate pain. 07/16/13   Mirna Mires, MD   BP 124/64 mmHg  Pulse 102  Temp(Src) 98.4  F (36.9 C) (Oral)  Resp 15  Ht 5' 4"  (1.626 m)  Wt 107 lb (48.535 kg)  BMI 18.36 kg/m2  SpO2 100% Physical Exam  Constitutional: She is oriented to person, place, and time. She appears well-developed and well-nourished. No distress.  HENT:  Head: Normocephalic and atraumatic.  Mouth/Throat: No oropharyngeal exudate.  Dry mucus membranes  Eyes: Conjunctivae and EOM are normal.  Neck: Normal range of motion. Neck supple. No tracheal deviation present.  Cardiovascular: Regular rhythm and normal heart sounds.  Tachycardia present.  Exam reveals no friction rub.   No murmur heard. Pulses:      Radial pulses are 2+ on the right side, and 2+ on the left side.       Dorsalis pedis pulses are 2+ on the right side, and 2+ on the left side.  Cap refill < 3 seconds Negative swelling or pitting edema noted to the lower extremities bilaterally   Pulmonary/Chest: Effort normal and breath sounds normal. No respiratory distress. She has no wheezes. She has no rales.  Abdominal: Soft. She exhibits no distension. There is tenderness. There is no rebound and no guarding.  Negative abdominal distension  BS hyperactive in all 4 quadrants Abdomen soft upon palpation  Discomfort upon palpation is generalized Negative peritoneal signs  Musculoskeletal: Normal range of motion.  Full ROM to upper and lower extremities without difficulty noted, negative ataxia noted.  Lymphadenopathy:    She has no cervical adenopathy.  Neurological: She is alert and oriented to person, place, and time. No cranial nerve deficit. She exhibits normal muscle tone. Coordination normal.  Skin: Skin is warm and dry. No rash noted. She is not diaphoretic. No erythema.  Psychiatric: She has a normal mood and affect. Her behavior is normal. Thought content normal.  Nursing note and vitals reviewed.   ED Course  Procedures (including critical care time)  Results for orders placed or performed during the hospital encounter of  03/29/14  CBC with Differential  Result Value Ref Range   WBC 9.6 4.0 - 10.5 K/uL   RBC 3.82 (L) 3.87 - 5.11 MIL/uL   Hemoglobin 11.9 (L) 12.0 - 15.0 g/dL   HCT 36.4 36.0 - 46.0 %   MCV 95.3 78.0 - 100.0 fL   MCH 31.2 26.0 - 34.0 pg   MCHC 32.7 30.0 - 36.0 g/dL   RDW 15.8 (H) 11.5 - 15.5 %   Platelets 181 150 - 400 K/uL   Neutrophils Relative % 61 43 - 77 %   Neutro Abs 5.8 1.7 - 7.7 K/uL   Lymphocytes Relative 29 12 - 46 %   Lymphs Abs 2.8 0.7 - 4.0 K/uL   Monocytes Relative 9 3 - 12 %   Monocytes Absolute 0.9 0.1 - 1.0 K/uL   Eosinophils Relative 1 0 - 5 %   Eosinophils Absolute 0.1 0.0 - 0.7 K/uL   Basophils Relative 0 0 - 1 %   Basophils Absolute 0.0 0.0 - 0.1 K/uL  Basic metabolic panel  Result Value Ref Range   Sodium 144 137 - 147 mEq/L   Potassium 2.9 (LL) 3.7 - 5.3 mEq/L   Chloride 115 (H) 96 - 112 mEq/L   CO2 8 (LL) 19 - 32 mEq/L   Glucose, Bld 86 70 - 99 mg/dL   BUN 60 (H) 6 - 23 mg/dL   Creatinine, Ser 5.94 (H) 0.50 - 1.10 mg/dL   Calcium 6.6 (L) 8.4 - 10.5 mg/dL   GFR calc non Af Amer 7 (L) >90 mL/min   GFR calc Af Amer 8 (L) >90 mL/min   Anion gap 21 (H) 5 - 15  Magnesium  Result Value Ref Range   Magnesium 1.0 (L) 1.5 - 2.5 mg/dL  I-Stat Chem 8, ED  Result Value Ref Range   Sodium 146 137 - 147 mEq/L   Potassium 2.8 (LL) 3.7 - 5.3 mEq/L   Chloride 123 (H) 96 - 112 mEq/L   BUN 70 (H) 6 - 23 mg/dL   Creatinine, Ser 7.30 (H) 0.50 - 1.10 mg/dL   Glucose, Bld 87 70 - 99 mg/dL   Calcium, Ion 0.99 (L) 1.13 - 1.30 mmol/L   TCO2 8 0 - 100 mmol/L   Hemoglobin 13.3 12.0 - 15.0 g/dL   HCT 39.0 36.0 - 46.0 %   Comment NOTIFIED PHYSICIAN     Labs Review Labs Reviewed  CBC WITH DIFFERENTIAL - Abnormal; Notable for the following:    RBC 3.82 (*)    Hemoglobin 11.9 (*)    RDW 15.8 (*)    All  other components within normal limits  BASIC METABOLIC PANEL - Abnormal; Notable for the following:    Potassium 2.9 (*)    Chloride 115 (*)    CO2 8 (*)    BUN 60  (*)    Creatinine, Ser 5.94 (*)    Calcium 6.6 (*)    GFR calc non Af Amer 7 (*)    GFR calc Af Amer 8 (*)    Anion gap 21 (*)    All other components within normal limits  MAGNESIUM - Abnormal; Notable for the following:    Magnesium 1.0 (*)    All other components within normal limits  I-STAT CHEM 8, ED - Abnormal; Notable for the following:    Potassium 2.8 (*)    Chloride 123 (*)    BUN 70 (*)    Creatinine, Ser 7.30 (*)    Calcium, Ion 0.99 (*)    All other components within normal limits  COMPREHENSIVE METABOLIC PANEL  CBC WITH DIFFERENTIAL  URINALYSIS, ROUTINE W REFLEX MICROSCOPIC  LACTIC ACID, PLASMA  ETHANOL    Imaging Review Ct Abdomen Pelvis Wo Contrast  03/29/2014   CLINICAL DATA:  68 year old female with generalized abdominal pain, nausea, vomiting and diarrhea for 1 day. 16-17 lb weight loss since June 2015. History of Crohn's disease. Evaluate for possible pancreatitis.  EXAM: CT ABDOMEN AND PELVIS WITHOUT CONTRAST  TECHNIQUE: Multidetector CT imaging of the abdomen and pelvis was performed following the standard protocol without IV contrast.  COMPARISON:  05/16/2013.  FINDINGS: Lower chest:  Unremarkable.  Hepatobiliary: Status post cholecystectomy. No definite focal cystic or solid hepatic lesions on today's non contrast CT examination.  Pancreas: Unremarkable.  Spleen: Unremarkable.  Adrenals/Urinary Tract: Normal appearance of the adrenal glands bilaterally. Multiple nonobstructive calculi are present within the collecting systems of the kidneys bilaterally, largest of which measures up to 6 mm in the upper pole collecting system of the right kidney. No additional calculi are noted along the course of either ureter or within the lumen of the urinary bladder. No hydroureteronephrosis or perinephric stranding to indicate urinary tract obstruction at this time. Urinary bladder is normal in appearance. Several small low-attenuation lesions are noted in the kidneys  bilaterally, the largest of which is exophytic measuring 1.7 x 1.1 cm in the lateral aspect of the lower pole of the left kidney. Although these are incompletely characterized on today's non contrast CT examination, they are similar to the prior examination, and therefore favored to represent cysts.  Stomach/Bowel: Small diverticulum from the cardia of the stomach. No pathologic dilatation of small bowel or colon. Status post right hemicolectomy. The small bowel is generally unremarkable in appearance. Specifically, no gross inflammatory changes associated with small bowel or small bowel mesenteric noted at this time.  Vascular/Lymphatic: Atherosclerosis throughout the abdominal and pelvic vasculature, without evidence of aneurysm. No pathologically enlarged lymph nodes are noted in the abdomen or pelvis on today's non contrast CT examination.  Reproductive: Status post hysterectomy. Ovaries are not confidently identified and may be surgically absent or atrophic.  Other: No significant volume of ascites.  No pneumoperitoneum.  Musculoskeletal: There are no aggressive appearing lytic or blastic lesions noted in the visualized portions of the skeleton.  IMPRESSION: 1. Status post right hemicolectomy. No gross inflammatory changes associated with the small bowel or colon on today's noncontrast CT examination to strongly suggest presence of an acute Crohn's exacerbation. 2. The pancreas is normal in appearance on today's noncontrast examination. 3. Numerous nonobstructive calculi within the  collecting systems of the kidneys bilaterally, largest of which measures up to 6 mm in the upper pole collecting system of the right kidney. No ureteral stones or findings of urinary tract obstruction are noted at this time. 4. Additional incidental findings, as above.   Electronically Signed   By: Vinnie Langton M.D.   On: 03/29/2014 16:56   Dg Chest 2 View  03/29/2014   CLINICAL DATA:  Acute renal failure with weakness  EXAM:  CHEST  2 VIEW  COMPARISON:  July 06, 2013  FINDINGS: There is underlying emphysematous change. There is no edema or consolidation. Heart is upper normal in size with pulmonary vascularity within normal limits. No adenopathy. There are surgical clips in the region of the thyroid. No bone lesions.  IMPRESSION: Underlying emphysematous change.  No edema or consolidation.   Electronically Signed   By: Lowella Grip M.D.   On: 03/29/2014 21:41     EKG Interpretation None      9:40 PM This provider spoke with Dr. Posey Pronto, nephrology-discussed case, labs, imaging, vitals in great detail. Nephrology to come and assess patient, to consult.  10:19 PM This provider spoke with Dr. Ernestina Patches - dicussed case, labs, imaging, vitals, ED course in great detail. Patient to be admitted for acute renal failure.   MDM   Final diagnoses:  Acute renal failure  Hypokalemia  Hypomagnesemia    Medications  heparin injection 5,000 Units (not administered)  0.9 % NaCl with KCl 20 mEq/ L  infusion (not administered)  morphine 2 MG/ML injection 2-4 mg (not administered)  ondansetron (ZOFRAN) tablet 4 mg (not administered)    Or  ondansetron [facility]) injection 4 mg (not administered)  sodium chloride 0.9 % bolus 1,000 mL (1,000 mLs Intravenous New Bag/Given 03/29/14 2116)   Filed Vitals:   03/29/14 2100 03/29/14 2115 03/29/14 2145 03/29/14 2200  BP: 113/70 115/62 118/64 124/64  Pulse:  102    Temp:      TempSrc:      Resp: 22 35 16 15  Height:      Weight:      SpO2:  100%     This provider viewed the patient's chart. Patient had a CT abdomen and pelvis without contrast performed today that identified status post right hemicolectomy-negative findings of acute Crohn's exacerbation. Numerous nonobstructive calculi bilaterally, largest in the upper pole of the right kidney measuring approximately 6 mm. CBC unremarkable. BMP noted hypokalemia of 2.9, BUN of 70, creatinine of 7.30. Elevated anion Of 21.  Bicarbonate 8. Hypomagnesemia of 1.0. Chest x-ray noted underlying emphysema-no edema or consolidation. Patient started on IV fluids for hydration. This provider spoke with nephrology-nephrology to consult. This provider spoke with the [facility]ad hospitalists who are to admit the patient regarding acute renal failure. Admitted to [facility]. Patient understood and agreed to plan of admission. Patient stable for transfer.  Jamse Mead, PA-C 03/29/14 2249

## 2014-03-29 NOTE — H&P (Signed)
Hospitalist Admission History and Physical  Patient name: Natasha [name] Medical record number: [id number] Date of birth: 1945-07-28 Age: 68 y.o. Gender: female  Primary Care Provider: Nena Jordan, Mammie Lorenzo, MD  Chief Complaint: abd pain, AKI, AGMA, hypokalemia, nephrolithiasis   History of Present Illness:This is a 68 y.o. year old female with significant past medical history of stage 4 CKD, chrons disease s/p colectomy, COPD, HTN presenting with abd pain, AKI, AGMA, hypokalemia, nephrolithiasis. Pt reports abd pain over the past 1-2 weeks. States that she saw her PCP about back pain 2 weeks ago. Had otherwise negative workup per pt. Was put on vicodin with improvement in sxs. Pt states that she then went on to developed generalized abd pain and nausea. Denies any diarrhea or dysuria. States that she felt that she was becoming progressively dehydrated over course of abd pain. Tried to take in oral fluids, but feels like she was unsuccessful. Followed [facility] for [facility]. Has prn lasix she takes for edema. Denies taking this within last 1-2 weeks. Saw her PCP because of sxs. PCP ordered CT abd pel w/o contrast and bloodwork. CT showed no acute findings concerning for colitis of pancreatitis. Did show multiple bilateral non obstructing kidney stones. BMET showed Cr 5.94 (baselin around 2.2),  Bicarb 8, AG 21, BUN 60, K 2.9, Mg 1.0 Presented to ER afebrile, HR 100s, resp 10s, BP 110s-130s, Satting 98% on RA. CXR shows underlying emphysema w/o acute abnormality. Nephrology formally consulted per ED PA Mable Fill).   Assessment and Plan: Natasha [name] is a 68 y.o. year old female presenting with abd pain, AKI, AGMA, hypokalemia, hypomagnesemia, nephrolithiasis  Active Problems:   Metabolic acidosis  1- Abd pain/nephrolithiasis   -Likely secondary to nephrolithiasis- stone burden -otherwise non acute CT  -no acute urinary obstruction on imaging -largest stone noted to be 7m   -may benefit from urology consult given concominant AKI  -start on flomax as BP tolerates -morphine for pain control   2-AKI  -Acute on chronic issue in setting of above  -likely secondary to prerenal etiology  -UA pending -renal consult pending  -? does meet some criteria for HD  -f/u renal recs  -gently hydrate  -hold potential offending meds   3- AGMA -Likely secondary to progression of renal disease -check lactate, ETOH -gently hydrate  -f/u renal recs   4-hypokalemia/hypomagnesemia  -likely secondary to above  -replete  -follow K and mag   5- COPD/Emphysema  -no resp distress/increased WOB -stable on RA -cont home regimen including prednisone- consider steroid burst and cortisol if BP drops   FEN/GI: NPO for now. ADAT  Prophylaxis: sub q heparin  Disposition: pending further evaluation  Code Status:Full Code    Patient Active Problem List   Diagnosis Date Noted  . Spinal stenosis of lumbar region 11/01/2013  . Bilateral leg pain 09/28/2013  . Emphysema/COPD   . Dyspnea 09/20/2013  . Protein-calorie malnutrition, severe 05/14/2013  . Salmonella enteritidis 06/11/2012  . Hypocalcemia 06/10/2012  . Hypothyroidism 06/10/2012  . Anemia 06/09/2012  . Hypomagnesemia 06/09/2012  . CKD (chronic kidney disease) 06/09/2012  . Acute on chronic kidney disease, stage 4 06/09/2012  . Acute renal failure 06/07/2012  . Dehydration 06/07/2012  . Crohn's disease 06/07/2012  . Metabolic acidosis 093/81/8299 . Hypokalemia 06/07/2012  . Diarrhea 06/07/2012  . UTI (lower urinary tract infection) 06/07/2012   Past Medical History: Past Medical History  Diagnosis Date  . Chronic kidney disease   . GERD (gastroesophageal reflux disease)   .  Crohn's colitis   . CKD (chronic kidney disease) 06/09/2012  . Hypothyroidism 06/10/2012  . Arthritis   . Osteoporosis   . Hypertension   . Plantar warts   . Postmenopausal   . Glaucoma   . Bulging disc     L3-L4  . Atypical  chest pain   . Emphysema/COPD     Past Surgical History: Past Surgical History  Procedure Laterality Date  . Colon resection    . Esophagogastroduodenoscopy  03/04/2012    Procedure: ESOPHAGOGASTRODUODENOSCOPY (EGD);  Surgeon: Garlan Fair, MD;  Location: [facility];  Service: Endoscopy;  Laterality: N/A;  . Flexible sigmoidoscopy  03/04/2012    Procedure: FLEXIBLE SIGMOIDOSCOPY;  Surgeon: Garlan Fair, MD;  Location: [facility];  Service: Endoscopy;  Laterality: N/A;  . Abdominal hysterectomy    . Appendectomy    . Thyroidectomy      Social History: History   Social History  . Marital Status: Married    Spouse Name: [name]    Number of Children: 3  . Years of Education: 12   Occupational History  . Retired    Social History Main Topics  . Smoking status: Former Smoker -- 0.25 packs/day for 15 years    Types: Cigarettes  . Smokeless tobacco: Never Used     Comment: Quit smoking in 2003  . Alcohol Use: 1.2 oz/week    2 Glasses of wine per week     Comment: 1-2 drinks monthly  . Drug Use: No  . Sexual Activity: No   Other Topics Concern  . None   Social History Narrative   Patient is married [name]) and lives at home with her husband.   Patient has three children.   Patient is retired on disability.   Patient drinks two cups of caffeine daily.   Patient is right-handed.             Family History: Family History  Problem Relation Age of Onset  . Hypertension Father   . Diverticulitis Mother   . COPD Mother   . Hypertension Mother   . Colon cancer Brother   . Diabetes Brother   . Thyroid disease Brother     Allergies: Allergies  Allergen Reactions  . Mercaptopurine Other (See Comments)    Unknown reaction  . Remicade [Infliximab] Other (See Comments)    Unknown reaction    Current Facility-Administered Medications  Medication Dose Route Frequency Provider Last Rate Last Dose  . 0.9 % NaCl with KCl 20 mEq/ L  infusion   Intravenous  Continuous Shanda Howells, MD      . heparin injection 5,000 Units  5,000 Units Subcutaneous 3 times per day Shanda Howells, MD      . morphine 2 MG/ML injection 2-4 mg  2-4 mg Intravenous Q2H PRN Shanda Howells, MD      . ondansetron [facility]) tablet 4 mg  4 mg Oral Q6H PRN Shanda Howells, MD       Or  . ondansetron [facility]) injection 4 mg  4 mg Intravenous Q6H PRN Shanda Howells, MD       Current Outpatient Prescriptions  Medication Sig Dispense Refill  . acetaminophen (TYLENOL) 500 MG tablet Take 500 mg by mouth every 6 (six) hours as needed. For severe pain    . albuterol (PROVENTIL HFA;VENTOLIN HFA) 108 (90 BASE) MCG/ACT inhaler Inhale 2 puffs into the lungs every 6 (six) hours as needed for wheezing or shortness of breath.    . calcitRIOL (ROCALTROL) 0.5 MCG  capsule Take 0.5 mcg by mouth daily.    . Cholecalciferol (VITAMIN D) 2000 UNITS tablet Take 1 tablet (2,000 Units total) by mouth 3 (three) times daily.    . cyanocobalamin (,VITAMIN B-12,) 1000 MCG/ML injection Inject 1,000 mcg into the muscle every 30 (thirty) days. Last injection was 04/28/12    . cyclobenzaprine (FLEXERIL) 10 MG tablet Take 10 mg by mouth as needed for muscle spasms.     . folic acid (FOLVITE) 1 MG tablet Take 1 mg by mouth daily.    . furosemide (LASIX) 40 MG tablet Take 40 mg by mouth daily as needed for edema.     [name] levothyroxine (SYNTHROID, LEVOTHROID) 125 MCG tablet Take 125 mcg by mouth daily.    . potassium chloride (KLOR-CON) 20 MEQ packet Take 20 mEq by mouth 3 (three) times daily.    . predniSONE (DELTASONE) 5 MG tablet Take 5 mg by mouth daily with breakfast.    . promethazine (PHENERGAN) 25 MG tablet Take by mouth every 6 (six) hours as needed for nausea or vomiting. Taking 1-2 tablets    . QVAR 80 MCG/ACT inhaler Inhale 2 puffs into the lungs as needed (for wheezing).     . SPIRIVA HANDIHALER 18 MCG inhalation capsule Place 18 mcg into inhaler and inhale as needed (for COPD).     . fluticasone (FLONASE)  50 MCG/ACT nasal spray Place 2 sprays into both nostrils daily as needed for allergies or rhinitis.    [name] HYDROcodone-acetaminophen (NORCO/VICODIN) 5-325 MG per tablet Take 1-2 tablets by mouth every 6 (six) hours as needed for moderate pain. 20 tablet 0   Review Of Systems: 12 point ROS negative except as noted above in HPI.  Physical Exam: Filed Vitals:   03/29/14 2200  BP: 124/64  Pulse:   Temp:   Resp: 15    General: alert and cooperative HEENT: PERRLA, extra ocular movement intact and dry oral mucosa Heart: S1, S2 normal, no murmur, rub or gallop, regular rate and rhythm Lungs: clear to auscultation, no wheezes or rales and unlabored breathing Abdomen: + bowel sounds, + mild-moderate abd and bilateral flank TTP  Extremities: extremities normal, atraumatic, no cyanosis or edema Skin:no rashes Neurology: normal without focal findings  Labs and Imaging: Lab Results  Component Value Date/Time   NA 146 03/29/2014 07:54 PM   K 2.8* 03/29/2014 07:54 PM   CL 123* 03/29/2014 07:54 PM   CO2 8* 03/29/2014 07:41 PM   BUN 70* 03/29/2014 07:54 PM   CREATININE 7.30* 03/29/2014 07:54 PM   GLUCOSE 87 03/29/2014 07:54 PM   Lab Results  Component Value Date   WBC 9.6 03/29/2014   HGB 13.3 03/29/2014   HCT 39.0 03/29/2014   MCV 95.3 03/29/2014   PLT 181 03/29/2014    Ct Abdomen Pelvis Wo Contrast  03/29/2014   CLINICAL DATA:  69 year old female with generalized abdominal pain, nausea, vomiting and diarrhea for 1 day. 16-17 lb weight loss since June 2015. History of Crohn's disease. Evaluate for possible pancreatitis.  EXAM: CT ABDOMEN AND PELVIS WITHOUT CONTRAST  TECHNIQUE: Multidetector CT imaging of the abdomen and pelvis was performed following the standard protocol without IV contrast.  COMPARISON:  05/16/2013.  FINDINGS: Lower chest:  Unremarkable.  Hepatobiliary: Status post cholecystectomy. No definite focal cystic or solid hepatic lesions on today's non contrast CT examination.   Pancreas: Unremarkable.  Spleen: Unremarkable.  Adrenals/Urinary Tract: Normal appearance of the adrenal glands bilaterally. Multiple nonobstructive calculi are present within the collecting systems of  the kidneys bilaterally, largest of which measures up to 6 mm in the upper pole collecting system of the right kidney. No additional calculi are noted along the course of either ureter or within the lumen of the urinary bladder. No hydroureteronephrosis or perinephric stranding to indicate urinary tract obstruction at this time. Urinary bladder is normal in appearance. Several small low-attenuation lesions are noted in the kidneys bilaterally, the largest of which is exophytic measuring 1.7 x 1.1 cm in the lateral aspect of the lower pole of the left kidney. Although these are incompletely characterized on today's non contrast CT examination, they are similar to the prior examination, and therefore favored to represent cysts.  Stomach/Bowel: Small diverticulum from the cardia of the stomach. No pathologic dilatation of small bowel or colon. Status post right hemicolectomy. The small bowel is generally unremarkable in appearance. Specifically, no gross inflammatory changes associated with small bowel or small bowel mesenteric noted at this time.  Vascular/Lymphatic: Atherosclerosis throughout the abdominal and pelvic vasculature, without evidence of aneurysm. No pathologically enlarged lymph nodes are noted in the abdomen or pelvis on today's non contrast CT examination.  Reproductive: Status post hysterectomy. Ovaries are not confidently identified and may be surgically absent or atrophic.  Other: No significant volume of ascites.  No pneumoperitoneum.  Musculoskeletal: There are no aggressive appearing lytic or blastic lesions noted in the visualized portions of the skeleton.  IMPRESSION: 1. Status post right hemicolectomy. No gross inflammatory changes associated with the small bowel or colon on today's noncontrast  CT examination to strongly suggest presence of an acute Crohn's exacerbation. 2. The pancreas is normal in appearance on today's noncontrast examination. 3. Numerous nonobstructive calculi within the collecting systems of the kidneys bilaterally, largest of which measures up to 6 mm in the upper pole collecting system of the right kidney. No ureteral stones or findings of urinary tract obstruction are noted at this time. 4. Additional incidental findings, as above.   Electronically Signed   By: Vinnie Langton M.D.   On: 03/29/2014 16:56   Dg Chest 2 View  03/29/2014   CLINICAL DATA:  Acute renal failure with weakness  EXAM: CHEST  2 VIEW  COMPARISON:  July 06, 2013  FINDINGS: There is underlying emphysematous change. There is no edema or consolidation. Heart is upper normal in size with pulmonary vascularity within normal limits. No adenopathy. There are surgical clips in the region of the thyroid. No bone lesions.  IMPRESSION: Underlying emphysematous change.  No edema or consolidation.   Electronically Signed   By: Lowella Grip M.D.   On: 03/29/2014 21:41           Shanda Howells MD  Pager: [phone]

## 2014-03-29 NOTE — ED Notes (Addendum)
Raquel Sarna RN (Nurse 1st at triage) notified of critical lab values on Chem 8

## 2014-03-30 DIAGNOSIS — N179 Acute kidney failure, unspecified: Secondary | ICD-10-CM

## 2014-03-30 DIAGNOSIS — K50919 Crohn's disease, unspecified, with unspecified complications: Secondary | ICD-10-CM

## 2014-03-30 DIAGNOSIS — E872 Acidosis: Secondary | ICD-10-CM

## 2014-03-30 DIAGNOSIS — N200 Calculus of kidney: Secondary | ICD-10-CM

## 2014-03-30 DIAGNOSIS — E039 Hypothyroidism, unspecified: Secondary | ICD-10-CM

## 2014-03-30 DIAGNOSIS — E876 Hypokalemia: Secondary | ICD-10-CM

## 2014-03-30 DIAGNOSIS — J439 Emphysema, unspecified: Secondary | ICD-10-CM | POA: Insufficient documentation

## 2014-03-30 LAB — COMPREHENSIVE METABOLIC PANEL
ALBUMIN: 3 g/dL — AB (ref 3.5–5.2)
ALK PHOS: 177 U/L — AB (ref 39–117)
ALT: 9 U/L (ref 0–35)
ALT: 9 U/L (ref 0–35)
AST: 14 U/L (ref 0–37)
AST: 17 U/L (ref 0–37)
Albumin: 3 g/dL — ABNORMAL LOW (ref 3.5–5.2)
Alkaline Phosphatase: 171 U/L — ABNORMAL HIGH (ref 39–117)
Anion gap: 18 — ABNORMAL HIGH (ref 5–15)
BUN: 57 mg/dL — ABNORMAL HIGH (ref 6–23)
BUN: 57 mg/dL — ABNORMAL HIGH (ref 6–23)
CALCIUM: 6.1 mg/dL — AB (ref 8.4–10.5)
CO2: 7 mEq/L — CL (ref 19–32)
Calcium: 6.1 mg/dL — CL (ref 8.4–10.5)
Chloride: 122 mEq/L — ABNORMAL HIGH (ref 96–112)
Chloride: 120 mEq/L — ABNORMAL HIGH (ref 96–112)
Creatinine, Ser: 5.83 mg/dL — ABNORMAL HIGH (ref 0.50–1.10)
Creatinine, Ser: 5.76 mg/dL — ABNORMAL HIGH (ref 0.50–1.10)
GFR calc Af Amer: 8 mL/min — ABNORMAL LOW (ref >90)
GFR calc Af Amer: 8 mL/min — ABNORMAL LOW (ref >90)
GFR calc non Af Amer: 7 mL/min — ABNORMAL LOW (ref >90)
GFR, EST NON AFRICAN AMERICAN: 7 mL/min — AB (ref >90)
Glucose, Bld: 97 mg/dL (ref 70–99)
Glucose, Bld: 105 mg/dL — ABNORMAL HIGH (ref 70–99)
POTASSIUM: 3.3 meq/L — AB (ref 3.7–5.3)
Potassium: 3.3 mEq/L — ABNORMAL LOW (ref 3.7–5.3)
SODIUM: 144 meq/L (ref 137–147)
Sodium: 147 mEq/L (ref 137–147)
Total Bilirubin: 0.3 mg/dL (ref 0.3–1.2)
Total Bilirubin: 0.4 mg/dL (ref 0.3–1.2)
Total Protein: 6.2 g/dL (ref 6.0–8.3)
Total Protein: 6.4 g/dL (ref 6.0–8.3)

## 2014-03-30 LAB — BLOOD GAS, ARTERIAL
ACID-BASE DEFICIT: 24 mmol/L — AB (ref 0.0–2.0)
Acid-base deficit: 18 mmol/L — ABNORMAL HIGH (ref 0.0–2.0)
BICARBONATE: 4.7 meq/L — AB (ref 20.0–24.0)
Bicarbonate: 8.4 mEq/L — ABNORMAL LOW (ref 20.0–24.0)
DRAWN BY: 252031
Drawn by: 246861
FIO2: 0.21 %
FIO2: 0.21 %
O2 SAT: 95.2 %
O2 Saturation: 97.5 %
PATIENT TEMPERATURE: 98.6
PH ART: 7.038 — AB (ref 7.350–7.450)
Patient temperature: 98.6
TCO2: 5.2 mmol/L (ref 0–100)
TCO2: 9 mmol/L (ref 0–100)
pCO2 arterial: 18.2 mmHg — CL (ref 35.0–45.0)
pCO2 arterial: 21.2 mmHg — ABNORMAL LOW (ref 35.0–45.0)
pH, Arterial: 7.22 — ABNORMAL LOW (ref 7.350–7.450)
pO2, Arterial: 100 mmHg (ref 80.0–100.0)
pO2, Arterial: 125 mmHg — ABNORMAL HIGH (ref 80.0–100.0)

## 2014-03-30 LAB — CBC WITH DIFFERENTIAL/PLATELET
Basophils Absolute: 0 10*3/uL (ref 0.0–0.1)
Basophils Relative: 0 % (ref 0–1)
EOS ABS: 0 10*3/uL (ref 0.0–0.7)
Eosinophils Relative: 0 % (ref 0–5)
HEMATOCRIT: 34.4 % — AB (ref 36.0–46.0)
HEMOGLOBIN: 11.1 g/dL — AB (ref 12.0–15.0)
LYMPHS ABS: 2.1 10*3/uL (ref 0.7–4.0)
Lymphocytes Relative: 20 % (ref 12–46)
MCH: 31 pg (ref 26.0–34.0)
MCHC: 32.3 g/dL (ref 30.0–36.0)
MCV: 96.1 fL (ref 78.0–100.0)
MONOS PCT: 8 % (ref 3–12)
Monocytes Absolute: 0.8 10*3/uL (ref 0.1–1.0)
Neutro Abs: 7.6 10*3/uL (ref 1.7–7.7)
Neutrophils Relative %: 72 % (ref 43–77)
Platelets: 152 10*3/uL (ref 150–400)
RBC: 3.58 MIL/uL — AB (ref 3.87–5.11)
RDW: 15.6 % — ABNORMAL HIGH (ref 11.5–15.5)
WBC: 10.5 10*3/uL (ref 4.0–10.5)

## 2014-03-30 LAB — MAGNESIUM
MAGNESIUM: 1.3 mg/dL — AB (ref 1.5–2.5)
Magnesium: 1.5 mg/dL (ref 1.5–2.5)

## 2014-03-30 LAB — TSH: TSH: 5.31 u[IU]/mL — AB (ref 0.350–4.500)

## 2014-03-30 LAB — ALBUMIN: Albumin: 3 g/dL — ABNORMAL LOW (ref 3.5–5.2)

## 2014-03-30 LAB — LIPASE, BLOOD: Lipase: 208 U/L — ABNORMAL HIGH (ref 11–59)

## 2014-03-30 LAB — PHOSPHORUS: PHOSPHORUS: 3.9 mg/dL (ref 2.3–4.6)

## 2014-03-30 LAB — VITAMIN D 25 HYDROXY (VIT D DEFICIENCY, FRACTURES): Vit D, 25-Hydroxy: 20 ng/mL — ABNORMAL LOW (ref 30–89)

## 2014-03-30 LAB — MRSA PCR SCREENING: MRSA by PCR: NEGATIVE

## 2014-03-30 MED ORDER — SODIUM BICARBONATE 8.4 % IV SOLN
INTRAVENOUS | Status: AC
  Filled 2014-03-30: qty 50

## 2014-03-30 MED ORDER — SODIUM BICARBONATE 8.4 % IV SOLN
INTRAVENOUS | Status: DC
  Administered 2014-03-30: 18:00:00 via INTRAVENOUS
  Filled 2014-03-30 (×4): qty 150

## 2014-03-30 MED ORDER — POTASSIUM CHLORIDE 10 MEQ/100ML IV SOLN
10.0000 meq | INTRAVENOUS | Status: AC
  Administered 2014-03-30 (×4): 10 meq via INTRAVENOUS
  Filled 2014-03-30 (×3): qty 100

## 2014-03-30 MED ORDER — SODIUM CHLORIDE 0.45 % IV SOLN
INTRAVENOUS | Status: DC
  Administered 2014-03-30: 1000 mL via INTRAVENOUS
  Administered 2014-03-31: 09:00:00 via INTRAVENOUS

## 2014-03-30 MED ORDER — SODIUM BICARBONATE 8.4 % IV SOLN
100.0000 meq | Freq: Once | INTRAVENOUS | Status: AC
  Administered 2014-03-30: 100 meq via INTRAVENOUS
  Filled 2014-03-30: qty 100

## 2014-03-30 MED ORDER — SODIUM CHLORIDE 0.9 % IV SOLN
4.0000 g | Freq: Once | INTRAVENOUS | Status: AC
  Administered 2014-03-30: 4 g via INTRAVENOUS
  Filled 2014-03-30: qty 40

## 2014-03-30 MED ORDER — MAGNESIUM SULFATE 4 GM/100ML IV SOLN
4.0000 g | Freq: Once | INTRAVENOUS | Status: AC
  Administered 2014-03-30: 4 g via INTRAVENOUS
  Filled 2014-03-30: qty 100

## 2014-03-30 MED ORDER — ALBUTEROL SULFATE (2.5 MG/3ML) 0.083% IN NEBU: 2.5000 mg | INHALATION_SOLUTION | Freq: Four times a day (QID) | RESPIRATORY_TRACT | Status: DC | PRN

## 2014-03-30 NOTE — Progress Notes (Signed)
[place] TEAM 1 - Stepdown/ICU TEAM Progress Note  TAIZ BICKLE BLT:[id number] DOB: [birth year] DOA: 03/29/2014 PCP: Cloyd Stagers, MD  Admit HPI / Brief Narrative: 68 y.o. BF PMHx stage 4 CKD, chrons disease s/p colectomy, COPD, HTN presenting with abd pain, AKI, anion gap metabolic acidosis, hypokalemia, nephrolithiasis. Pt reports abd pain/back pain over the past 1-2 weeks. States that she saw her PCP about back pain 2 weeks ago. Had otherwise negative workup per pt. Was put on vicodin with improvement in sxs. Pt states that she then went on to developed generalized abd pain and nausea. Denies any diarrhea or dysuria. States that she felt that she was becoming progressively dehydrated over course of abd pain. Tried to take in oral fluids, but feels like she was unsuccessful. Followed [facility] for [facility]. Has prn lasix she takes for edema. Denies taking this within last 1-2 weeks. Saw her PCP because of sxs. PCP ordered CT abd pel w/o contrast and bloodwork. CT showed no acute findings concerning for colitis of pancreatitis. Did show multiple bilateral non obstructing kidney stones. BMET showed Cr 5.94 (baselin around 2.2), Bicarb 8, AG 21, BUN 60, K 2.9, Mg 1.0 Presented to ER afebrile, HR 100s, resp 10s, BP 110s-130s, Satting 98% on RA. CXR shows underlying emphysema w/o acute abnormality. Nephrology formally consulted per ED PA Mable Fill).   HPI/Subjective: 11/5 states positive N/V over last several days, currently positive nausea but improved with medication. States continues to have positive back pain, positive abdominal pain.states has never been on hemodialysis or home O2.  Assessment/Plan: Anion gap Metabolic acidosis-hyperchloremic -2 xAmp sodium bicarbonate (134mq)1 -continue sodium bicarbonate in D5W at 753mhr -0.45% normal saline at 7530mr   Abd pain/nephrolithiasis  -most likely multifactorial to include hypocalcemia, hypomagnesemia,  nephrolithiasis- stone burden,  -otherwise non acute CT  -will aggressively replete patient's electrolytes -start on flomax as BP tolerates -morphine for pain control   Combined systolic and diastolic CHF -at home on Lasix 40 mg daily will hold -when patient's BP tolerates would start on beta blocker  HTN -see combined systolic and diastolic CHF  Acute on chronic kidney failure  -baseline Cr ~ 2.1, on admission Cr = 7.3, has trended down with hydration -gently hydrate  -hold potential offending meds   hypokalemia/hypomagnesemia  -likely secondary to above  -potassium IV 10 mEq x4 -magnesium IV 4 gm  Hypocalcemia -calcium gluconate 4 gm   Crohn's disease -Continue home dose of steroids 5 mg daily   COPD/Emphysema  -Spiriva daily -Qvar 80 g 2 puffs daily(Flovent substituted for Qvar) -albuterol QID PRN   Hypothyroidism -Synthroid 125 g daily    Code Status: FULL Family Communication: family present at time of exam Disposition Plan: resolution of above problems    Consultants: Nephrology consult pending   Procedure/Significant Events: 2/16 echocardiogram;Left ventricle: mildly dilated.-LVEF= 55%. - Aortic valve: Moderate regurgitation. -significantly abnormal diastolicfunction 11/00/7 abdomen pelvis without contrast; space S/P right hemicolectomy.  -No gross inflammatory changes associated with the small bowel or colon to suggest presence of an acute Crohn'sexacerbation. - The pancreas is normal in appearance  -Numerous nonobstructive calculi within the collecting systems of kidneys bilaterally, largest of which measures up to 6 mm in upper pole collecting system of the right kidney.  -No ureteral stones or findings of urinary tract obstruction     Culture 11/5 MRSA by PCR negative  Antibiotics: NA  DVT prophylaxis: Subcutaneous heparin   Devices    LINES / TUBES:  Continuous Infusions: . 0.9 % NaCl with KCl 20 mEq / L 125  mL/hr at 03/29/14 2304    Objective: VITAL SIGNS: BP: 117/60 mmHg (11/05 1415) Pulse Rate: 103 (11/05 1415) SPO2; FIO2:   Intake/Output Summary (Last 24 hours) at 03/30/14 1427 Last data filed at 03/30/14 0344  Gross per 24 hour  Intake      0 ml  Output    475 ml  Net   -475 ml     Exam: General: A/O x 4, Mod distress 2dary to back pain, suprapubic pain.,No acute respiratory distress Lungs: Clear to auscultation bilaterally without wheezes or crackles Cardiovascular: Regular rate and rhythm without murmur gallop or rub normal S1 and S2 Abdomen: suprapubic pain> LLQ> LUQ. Right CVA> Left CVA pain, nondistended, soft, bowel sounds positive,  Extremities: No significant cyanosis, clubbing, or edema bilateral lower extremities  Data Reviewed: Basic Metabolic Panel:  Recent Labs Lab 03/29/14 1941 03/29/14 1954 03/30/14 0735  NA 144 146 147  K 2.9* 2.8* 3.3*  CL 115* 123* 122*  CO2 8*  --  7*  GLUCOSE 86 87 97  BUN 60* 70* 57*  CREATININE 5.94* 7.30* 5.83*  CALCIUM 6.6*  --  6.1*  MG 1.0*  --  1.5   Liver Function Tests:  Recent Labs Lab 03/30/14 0735  AST 14  ALT 9  ALKPHOS 171*  BILITOT 0.3  PROT 6.2  ALBUMIN 3.0*   No results for input(s): LIPASE, AMYLASE in the last 168 hours. No results for input(s): AMMONIA in the last 168 hours. CBC:  Recent Labs Lab 03/29/14 1941 03/29/14 1954 03/30/14 0735  WBC 9.6  --  10.5  NEUTROABS 5.8  --  7.6  HGB 11.9* 13.3 11.1*  HCT 36.4 39.0 34.4*  MCV 95.3  --  96.1  PLT 181  --  152   Cardiac Enzymes: No results for input(s): CKTOTAL, CKMB, CKMBINDEX, TROPONINI in the last 168 hours. BNP (last 3 results) No results for input(s): PROBNP in the last 8760 hours. CBG: No results for input(s): GLUCAP in the last 168 hours.  No results found for this or any previous visit (from the past 240 hour(s)).   Studies:  Recent x-ray studies have been reviewed in detail by the Attending Physician  Scheduled  Meds:  Scheduled Meds: . calcitRIOL  0.5 mcg Oral Daily  . fluticasone  2 puff Inhalation BID  . heparin  5,000 Units Subcutaneous 3 times per day  . levothyroxine  125 mcg Oral QAC breakfast  . predniSONE  5 mg Oral Q breakfast    Time spent on care of this patient: 40 mins   Allie Bossier , MD   [facility] Office  [phone] Pager - [phone]  On-Call/Text Page:      [email or web address]      password [email or web address]  If 7PM-7AM, please contact night-coverage [email or web address] Password [email or web address] 03/30/2014, 2:27 PM   LOS: 1 day

## 2014-03-30 NOTE — Plan of Care (Signed)
Problem: Consults Goal: General Medical Patient Education See Patient Education Module for specific education. Outcome: Progressing Goal: Skin Care Protocol Initiated - if Braden Score 18 or less If consults are not indicated, leave blank or document N/A Outcome: Completed/Met Date Met:  03/30/14 Goal: Nutrition Consult-if indicated Outcome: Progressing Goal: Diabetes Guidelines if Diabetic/Glucose > 140 If diabetic or lab glucose is > 140 mg/dl - Initiate Diabetes/Hyperglycemia Guidelines & Document Interventions  Outcome: Not Applicable Date Met:  16/10/96  Problem: Phase I Progression Outcomes Goal: Pain controlled with appropriate interventions Outcome: Progressing Goal: OOB as tolerated unless otherwise ordered Outcome: Progressing

## 2014-03-30 NOTE — ED Notes (Signed)
Admitting MD at bedside.

## 2014-03-30 NOTE — Progress Notes (Signed)
CRITICAL VALUE ALERT  Critical value received:  CO2 <2; Calcium 6.1  Date of notification:03/30/2014   Time of notification:5:48 PM    Critical value read back:yes  Nurse who received alert:  Donnella Bi, RN  MD notified (1st page):  Dr. Dia Crawford  Time of first page:  [phone]  MD notified (2nd page):   Time of second page:  Responding MD:  Dr. Dia Crawford  Time MD responded:  [phone]

## 2014-03-30 NOTE — ED Notes (Signed)
Critical Labs reported via lab to [facility] T. RN,CO2- 7,Ca- 6.1 , MD notified

## 2014-03-30 NOTE — ED Notes (Signed)
Patient is asleep.

## 2014-03-30 NOTE — ED Notes (Signed)
Patient is resting comfortably. 

## 2014-03-30 NOTE — Consult Note (Signed)
Reason for Consult: AKI Referring Physician: [facility]  HPI: Natasha [name] is an 68 y.o. female with PMH CKD 3, Chron's disease- s/p colectomy. Admitted on 03/29/2014 for symptoms of N/V and decreased PO intake AKI with metabolic acidosis. Patient follows Dr. Ihor Austin outpatient. Found to be hypomagnesemic and hypokalemic. Initial creatinine of 5.94 with an increase to 7.3.  Had been 2.9 as OP in August.  Started on IVF with potassium and given magnesium to replete electrolytes. CT abdomen without contrast shows non-obstructing renal calculi. No evidence of Chron's flare. She also seems to have a pretty severe metabolic acidosis- Co2 in Feb was 66- was 74 in May and 13 in August.  She reports that she was on bicarb pills in the past but they were stopped due to some issue with her colon.  She reports feeling slightly better since getting to ED- numbers all are trending better   Trend in Creatinine: CREATININE, SER  Date/Time Value Ref Range Status  03/30/2014 07:35 AM 5.83* 0.50 - 1.10 mg/dL Final  03/29/2014 07:54 PM 7.30* 0.50 - 1.10 mg/dL Final  03/29/2014 07:41 PM 5.94* 0.50 - 1.10 mg/dL Final  05/18/2013 04:58 AM 2.11* 0.50 - 1.10 mg/dL Final  05/17/2013 04:50 AM 2.18* 0.50 - 1.10 mg/dL Final  05/16/2013 05:20 AM 2.23* 0.50 - 1.10 mg/dL Final  05/15/2013 05:30 AM 2.36* 0.50 - 1.10 mg/dL Final  05/14/2013 04:31 AM 2.35* 0.50 - 1.10 mg/dL Final  05/13/2013 06:30 AM 2.64* 0.50 - 1.10 mg/dL Final  05/12/2013 10:45 PM 2.65* 0.50 - 1.10 mg/dL Final  05/12/2013 02:45 PM 3.02* 0.50 - 1.10 mg/dL Final  06/15/2012 04:40 AM 3.68* 0.50 - 1.10 mg/dL Final  06/14/2012 08:02 AM 4.78* 0.50 - 1.10 mg/dL Final  06/13/2012 09:10 AM 6.24* 0.50 - 1.10 mg/dL Final  06/12/2012 05:22 AM 8.27* 0.50 - 1.10 mg/dL Final  06/11/2012 06:10 AM 9.55* 0.50 - 1.10 mg/dL Final  06/10/2012 01:45 PM 9.69* 0.50 - 1.10 mg/dL Final  06/10/2012 03:45 AM 10.15* 0.50 - 1.10 mg/dL Final  06/09/2012 05:44 PM 9.84*  0.50 - 1.10 mg/dL Final  06/09/2012 03:25 AM 10.59* 0.50 - 1.10 mg/dL Final  06/08/2012 06:00 AM 10.49* 0.50 - 1.10 mg/dL Final  06/07/2012 12:10 PM 9.21* 0.50 - 1.10 mg/dL Final  06/03/2012 08:14 PM 2.68* 0.50 - 1.10 mg/dL Final  06/03/2012 07:10 PM 2.77* 0.50 - 1.10 mg/dL Final  03/07/2012 05:00 AM 3.21* 0.50 - 1.10 mg/dL Final  12/05/2010 03:52 PM 2.51* 0.50 - 1.10 mg/dL Final    Comment:    **Please note change in reference range.**  12/03/2010 10:07 AM 2.34* 0.50 - 1.10 mg/dL Final    Comment:    **Please note change in reference range.**  02/13/2008 03:33 PM 1.89*  Final  03/16/2007 03:10 AM 2.55*  Final  03/15/2007 04:50 AM 2.48*  Final  03/14/2007 03:35 AM 2.47*  Final  03/13/2007 05:14 PM 2.37*  Final  03/13/2007 01:33 PM 2.76*  Final    PMH:   Past Medical History  Diagnosis Date  . Chronic kidney disease   . GERD (gastroesophageal reflux disease)   . Crohn's colitis   . CKD (chronic kidney disease) 06/09/2012  . Hypothyroidism 06/10/2012  . Arthritis   . Osteoporosis   . Hypertension   . Plantar warts   . Postmenopausal   . Glaucoma   . Bulging disc     L3-L4  . Atypical chest pain   . Emphysema/COPD     PSH:   Past Surgical  History  Procedure Laterality Date  . Colon resection    . Esophagogastroduodenoscopy  03/04/2012    Procedure: ESOPHAGOGASTRODUODENOSCOPY (EGD);  Surgeon: Garlan Fair, MD;  Location: [facility];  Service: Endoscopy;  Laterality: N/A;  . Flexible sigmoidoscopy  03/04/2012    Procedure: FLEXIBLE SIGMOIDOSCOPY;  Surgeon: Garlan Fair, MD;  Location: [facility];  Service: Endoscopy;  Laterality: N/A;  . Abdominal hysterectomy    . Appendectomy    . Thyroidectomy      Allergies:  Allergies  Allergen Reactions  . Mercaptopurine Other (See Comments)    Unknown reaction  . Remicade [Infliximab] Other (See Comments)    Unknown reaction    Medications:   Prior to Admission medications   Medication Sig Start Date End  Date Taking? Authorizing Provider  acetaminophen (TYLENOL) 500 MG tablet Take 500 mg by mouth every 6 (six) hours as needed. For severe pain   Yes Historical Provider, MD  albuterol (PROVENTIL HFA;VENTOLIN HFA) 108 (90 BASE) MCG/ACT inhaler Inhale 2 puffs into the lungs every 6 (six) hours as needed for wheezing or shortness of breath.   Yes Historical Provider, MD  calcitRIOL (ROCALTROL) 0.5 MCG capsule Take 0.5 mcg by mouth daily.   Yes Historical Provider, MD  Cholecalciferol (VITAMIN D) 2000 UNITS tablet Take 1 tablet (2,000 Units total) by mouth 3 (three) times daily. 03/06/14  Yes Ward Givens, NP  cyanocobalamin (,VITAMIN B-12,) 1000 MCG/ML injection Inject 1,000 mcg into the muscle every 30 (thirty) days. Last injection was 04/28/12   Yes Historical Provider, MD  cyclobenzaprine (FLEXERIL) 10 MG tablet Take 10 mg by mouth as needed for muscle spasms.    Yes Historical Provider, MD  folic acid (FOLVITE) 1 MG tablet Take 1 mg by mouth daily.   Yes Historical Provider, MD  furosemide (LASIX) 40 MG tablet Take 40 mg by mouth daily as needed for edema.    Yes Historical Provider, MD  levothyroxine (SYNTHROID, LEVOTHROID) 125 MCG tablet Take 125 mcg by mouth daily.   Yes Historical Provider, MD  potassium chloride (KLOR-CON) 20 MEQ packet Take 20 mEq by mouth 3 (three) times daily.   Yes Historical Provider, MD  predniSONE (DELTASONE) 5 MG tablet Take 5 mg by mouth daily with breakfast.   Yes Historical Provider, MD  promethazine (PHENERGAN) 25 MG tablet Take by mouth every 6 (six) hours as needed for nausea or vomiting. Taking 1-2 tablets   Yes Historical Provider, MD  QVAR 80 MCG/ACT inhaler Inhale 2 puffs into the lungs as needed (for wheezing).  08/27/13  Yes Historical Provider, MD  SPIRIVA HANDIHALER 18 MCG inhalation capsule Place 18 mcg into inhaler and inhale as needed (for COPD).  09/07/13  Yes Historical Provider, MD  fluticasone (FLONASE) 50 MCG/ACT nasal spray Place 2 sprays into both  nostrils daily as needed for allergies or rhinitis.    Historical Provider, MD  HYDROcodone-acetaminophen (NORCO/VICODIN) 5-325 MG per tablet Take 1-2 tablets by mouth every 6 (six) hours as needed for moderate pain. 07/16/13   Mirna Mires, MD    Inpatient medications: . calcitRIOL  0.5 mcg Oral Daily  . fluticasone  2 puff Inhalation BID  . heparin  5,000 Units Subcutaneous 3 times per day  . levothyroxine  125 mcg Oral QAC breakfast  . predniSONE  5 mg Oral Q breakfast    Discontinued Meds:  There are no discontinued medications.  Social History:  reports that she has quit smoking. Her smoking use included Cigarettes. She has a  3.75 pack-year smoking history. She has never used smokeless tobacco. She reports that she drinks about 1.2 oz of alcohol per week. She reports that she does not use illicit drugs.  Family History:   Family History  Problem Relation Age of Onset  . Hypertension Father   . Diverticulitis Mother   . COPD Mother   . Hypertension Mother   . Colon cancer Brother   . Diabetes Brother   . Thyroid disease Brother     Pertinent items are noted in HPI. Weight change:   Intake/Output Summary (Last 24 hours) at 03/30/14 1255 Last data filed at 03/30/14 0344  Gross per 24 hour  Intake      0 ml  Output    475 ml  Net   -475 ml   BP 113/62 mmHg  Pulse 103  Temp(Src) 98.4 F (36.9 C) (Oral)  Resp 27  Ht 5' 4"  (1.626 m)  Wt 107 lb (48.535 kg)  BMI 18.36 kg/m2  SpO2 100% Filed Vitals:   03/30/14 1100 03/30/14 1115 03/30/14 1130 03/30/14 1135  BP: 123/62 125/53 113/58 113/62  Pulse:   103   Temp:      TempSrc:      Resp: 18 20 27    Height:      Weight:      SpO2:   100%      Physical exam Gen: Appears sleepy, in no distress HEENT: Moist mucous membranes, no JVD Heart: RRR, no murmur rubs or gallops Chest: clear to auscultation bilaterally, no  Mild edema to LE's  Labs: Basic Metabolic Panel:  Recent Labs Lab 03/29/14 1941 03/29/14 1954  03/30/14 0735  NA 144 146 147  K 2.9* 2.8* 3.3*  CL 115* 123* 122*  CO2 8*  --  7*  GLUCOSE 86 87 97  BUN 60* 70* 57*  CREATININE 5.94* 7.30* 5.83*  ALBUMIN  --   --  3.0*  CALCIUM 6.6*  --  6.1*   Liver Function Tests:  Recent Labs Lab 03/30/14 0735  AST 14  ALT 9  ALKPHOS 171*  BILITOT 0.3  PROT 6.2  ALBUMIN 3.0*   No results for input(s): LIPASE, AMYLASE in the last 168 hours. No results for input(s): AMMONIA in the last 168 hours. CBC:  Recent Labs Lab 03/29/14 1941 03/29/14 1954 03/30/14 0735  WBC 9.6  --  10.5  NEUTROABS 5.8  --  7.6  HGB 11.9* 13.3 11.1*  HCT 36.4 39.0 34.4*  MCV 95.3  --  96.1  PLT 181  --  152   PT/INR: No results for input(s): INR in the last 168 hours.   Cardiac Panel (last 3 results) No results for input(s): CKTOTAL, CKMB, TROPONINI, RELINDX in the last 72 hours.  No results for input(s): CKTOTAL, CKMB, CKMBINDEX, TROPONINI in the last 168 hours.  CBG: No results for input(s): GLUCAP in the last 168 hours.  Iron Studies: No results for input(s): IRON, TIBC, TRANSFERRIN, FERRITIN in the last 168 hours.  Xrays/Other Studies: Ct Abdomen Pelvis Wo Contrast  03/29/2014   CLINICAL DATA:  68 year old female with generalized abdominal pain, nausea, vomiting and diarrhea for 1 day. 16-17 lb weight loss since June 2015. History of Crohn's disease. Evaluate for possible pancreatitis.  EXAM: CT ABDOMEN AND PELVIS WITHOUT CONTRAST  TECHNIQUE: Multidetector CT imaging of the abdomen and pelvis was performed following the standard protocol without IV contrast.  COMPARISON:  05/16/2013.  FINDINGS: Lower chest:  Unremarkable.  Hepatobiliary: Status post cholecystectomy. No definite  focal cystic or solid hepatic lesions on today's non contrast CT examination.  Pancreas: Unremarkable.  Spleen: Unremarkable.  Adrenals/Urinary Tract: Normal appearance of the adrenal glands bilaterally. Multiple nonobstructive calculi are present within the collecting  systems of the kidneys bilaterally, largest of which measures up to 6 mm in the upper pole collecting system of the right kidney. No additional calculi are noted along the course of either ureter or within the lumen of the urinary bladder. No hydroureteronephrosis or perinephric stranding to indicate urinary tract obstruction at this time. Urinary bladder is normal in appearance. Several small low-attenuation lesions are noted in the kidneys bilaterally, the largest of which is exophytic measuring 1.7 x 1.1 cm in the lateral aspect of the lower pole of the left kidney. Although these are incompletely characterized on today's non contrast CT examination, they are similar to the prior examination, and therefore favored to represent cysts.  Stomach/Bowel: Small diverticulum from the cardia of the stomach. No pathologic dilatation of small bowel or colon. Status post right hemicolectomy. The small bowel is generally unremarkable in appearance. Specifically, no gross inflammatory changes associated with small bowel or small bowel mesenteric noted at this time.  Vascular/Lymphatic: Atherosclerosis throughout the abdominal and pelvic vasculature, without evidence of aneurysm. No pathologically enlarged lymph nodes are noted in the abdomen or pelvis on today's non contrast CT examination.  Reproductive: Status post hysterectomy. Ovaries are not confidently identified and may be surgically absent or atrophic.  Other: No significant volume of ascites.  No pneumoperitoneum.  Musculoskeletal: There are no aggressive appearing lytic or blastic lesions noted in the visualized portions of the skeleton.  IMPRESSION: 1. Status post right hemicolectomy. No gross inflammatory changes associated with the small bowel or colon on today's noncontrast CT examination to strongly suggest presence of an acute Crohn's exacerbation. 2. The pancreas is normal in appearance on today's noncontrast examination. 3. Numerous nonobstructive calculi  within the collecting systems of the kidneys bilaterally, largest of which measures up to 6 mm in the upper pole collecting system of the right kidney. No ureteral stones or findings of urinary tract obstruction are noted at this time. 4. Additional incidental findings, as above.   Electronically Signed   By: Vinnie Langton M.D.   On: 03/29/2014 16:56   Dg Chest 2 View  03/29/2014   CLINICAL DATA:  Acute renal failure with weakness  EXAM: CHEST  2 VIEW  COMPARISON:  July 06, 2013  FINDINGS: There is underlying emphysematous change. There is no edema or consolidation. Heart is upper normal in size with pulmonary vascularity within normal limits. No adenopathy. There are surgical clips in the region of the thyroid. No bone lesions.  IMPRESSION: Underlying emphysematous change.  No edema or consolidation.   Electronically Signed   By: Lowella Grip M.D.   On: 03/29/2014 21:41   Background: Patient is a 68yo female with a history of CKD3, COPD, and Chron's disease admitted for AKI with anion gap acidosis. She was found to be acidemic on ABG with what looks to be an uncompensated metabolic acidosis. Patient with associated hypomagnesemia and hypokalemia which has improved. Suspect possible AKI due to hypovolemia when looking at patient's overall clinical picture. Started on isotonic bicarbonate for acidosis on 11/5.    Assessment/Plan:  1. AKI with anion gap metabolic acidosis: Baseline creatinine appears to be around 2.1-2.3 but was 2.9 in August of this year. Creatinine on admission of 5.94 with a transient peak to 7.3. Creatinine improved to 5.83 today with  MIVF (NS @125ml /hr w/K+). CO2 of 8 on admission that has worsened to 7 today with an anion gap of 18. ABG shows acidemia (pH 7.038) with low bicarb of 4.7 and pCO2 of 18.2 which suggests a metabolic acidosis with attempted respiratory compensation. Does not appear to have received nephrotoxic agents pre or while in the hospital. CT was without  contrast. Unsure of the cause for her AKI, however, could consider hypovolemia with patient's tachycardia with high sodium and clear chest x-ray. Will decrease MIVF to 54m/hr and add isotonic bicarbonate at 768mhr for a total rate of 15052mr IVF. Follow-up Renal panel in AM. 2. CKD3: Followed by Dr. [facility]. On calcitriol. 3. Non-obstructive nephrolithiasis: bilateral. Seen on CT scan. Do not suspect this is causing AKI. 4. Hypomagnesemia: 1.0 on admission. s/p magnesium sulfate 2g IV. Improved to 1.5 today (11/5) 5. Hypokalemia: 2.9 on admission. S/p NS w/ K+33m68mt 125ml31mand improved to 3.3 today (11/5). IVF as above 6. Chron's disease with no evidence of acute exacerbation on CT (11/4). Patient actually more constipated than usual (having about 0-1 BMs per day down from around 4 per day). Management per primary.  7. Hypothyroidism: on synthroid. Management per primary team 8. COPD exacerbation: per primary  RalphCordelia PochePGY-2, [facility] 03/30/2014, 12:57 PM   Patient seen and examined, agree with above note with above modifications. Briefly, a 67 ye71 old BF with CKD thought due to HTN [facility].  She presented with abdominal pain, N/V and found to have a creatinine in the 5's with hypokalemia and hypomagnesemia and a metabolic acidosis.  So far, is improving with IV fluids, K and Mag replacement.  She is making urine.  Given her severe acidosis will start IV bicarb as well.  Will continue to follow trend- imaging does not reveal obstruction.  Hopefully symptoms did not indicate uremia and numbers will continue to improve KelliCorliss Parish11/09/2013

## 2014-03-30 NOTE — ED Notes (Signed)
Diet tray ordered for pt

## 2014-03-31 LAB — COMPREHENSIVE METABOLIC PANEL
ALT: 8 U/L (ref 0–35)
AST: 14 U/L (ref 0–37)
Albumin: 2.7 g/dL — ABNORMAL LOW (ref 3.5–5.2)
Alkaline Phosphatase: 158 U/L — ABNORMAL HIGH (ref 39–117)
Anion gap: 19 — ABNORMAL HIGH (ref 5–15)
BUN: 56 mg/dL — ABNORMAL HIGH (ref 6–23)
CALCIUM: 7.9 mg/dL — AB (ref 8.4–10.5)
CO2: 11 meq/L — AB (ref 19–32)
CREATININE: 5.78 mg/dL — AB (ref 0.50–1.10)
Chloride: 122 mEq/L — ABNORMAL HIGH (ref 96–112)
GFR calc Af Amer: 8 mL/min — ABNORMAL LOW (ref >90)
GFR, EST NON AFRICAN AMERICAN: 7 mL/min — AB (ref >90)
GLUCOSE: 76 mg/dL (ref 70–99)
Potassium: 3.1 mEq/L — ABNORMAL LOW (ref 3.7–5.3)
Sodium: 152 mEq/L — ABNORMAL HIGH (ref 137–147)
TOTAL PROTEIN: 5.8 g/dL — AB (ref 6.0–8.3)
Total Bilirubin: 0.6 mg/dL (ref 0.3–1.2)

## 2014-03-31 LAB — CBC WITH DIFFERENTIAL/PLATELET
Basophils Absolute: 0 10*3/uL (ref 0.0–0.1)
Basophils Relative: 0 % (ref 0–1)
Eosinophils Absolute: 0.1 10*3/uL (ref 0.0–0.7)
Eosinophils Relative: 1 % (ref 0–5)
HCT: 31.7 % — ABNORMAL LOW (ref 36.0–46.0)
Hemoglobin: 10.6 g/dL — ABNORMAL LOW (ref 12.0–15.0)
LYMPHS PCT: 19 % (ref 12–46)
Lymphs Abs: 1.3 10*3/uL (ref 0.7–4.0)
MCH: 31 pg (ref 26.0–34.0)
MCHC: 33.4 g/dL (ref 30.0–36.0)
MCV: 92.7 fL (ref 78.0–100.0)
Monocytes Absolute: 0.6 10*3/uL (ref 0.1–1.0)
Monocytes Relative: 9 % (ref 3–12)
NEUTROS ABS: 4.6 10*3/uL (ref 1.7–7.7)
Neutrophils Relative %: 71 % (ref 43–77)
PLATELETS: 171 10*3/uL (ref 150–400)
RBC: 3.42 MIL/uL — ABNORMAL LOW (ref 3.87–5.11)
RDW: 15.5 % (ref 11.5–15.5)
WBC: 6.6 10*3/uL (ref 4.0–10.5)

## 2014-03-31 LAB — PTH, INTACT AND CALCIUM
CALCIUM TOTAL (PTH): 5.9 mg/dL — AB (ref 8.4–10.5)
PTH: 184 pg/mL — AB (ref 14–64)

## 2014-03-31 LAB — BASIC METABOLIC PANEL
Anion gap: 18 — ABNORMAL HIGH (ref 5–15)
BUN: 56 mg/dL — ABNORMAL HIGH (ref 6–23)
CHLORIDE: 119 meq/L — AB (ref 96–112)
CO2: 10 mEq/L — CL (ref 19–32)
Calcium: 7.9 mg/dL — ABNORMAL LOW (ref 8.4–10.5)
Creatinine, Ser: 5.76 mg/dL — ABNORMAL HIGH (ref 0.50–1.10)
GFR calc Af Amer: 8 mL/min — ABNORMAL LOW (ref >90)
GFR, EST NON AFRICAN AMERICAN: 7 mL/min — AB (ref >90)
GLUCOSE: 97 mg/dL (ref 70–99)
POTASSIUM: 2.9 meq/L — AB (ref 3.7–5.3)
SODIUM: 147 meq/L (ref 137–147)

## 2014-03-31 LAB — MAGNESIUM
MAGNESIUM: 3.3 mg/dL — AB (ref 1.5–2.5)
Magnesium: 3 mg/dL — ABNORMAL HIGH (ref 1.5–2.5)

## 2014-03-31 LAB — GLUCOSE, CAPILLARY: GLUCOSE-CAPILLARY: 106 mg/dL — AB (ref 70–99)

## 2014-03-31 LAB — PHOSPHORUS: PHOSPHORUS: 3.2 mg/dL (ref 2.3–4.6)

## 2014-03-31 MED ORDER — BOOST / RESOURCE BREEZE PO LIQD
1.0000 | Freq: Three times a day (TID) | ORAL | Status: DC
  Administered 2014-03-31: 20:00:00 via ORAL
  Administered 2014-04-01 (×2): 1 via ORAL
  Administered 2014-04-01: 11:00:00 via ORAL
  Administered 2014-04-02: 1 via ORAL
  Administered 2014-04-02: 11:00:00 via ORAL
  Administered 2014-04-03 – 2014-04-06 (×7): 1 via ORAL

## 2014-03-31 MED ORDER — SODIUM BICARBONATE 8.4 % IV SOLN
INTRAVENOUS | Status: DC
  Administered 2014-03-31 – 2014-04-03 (×7): via INTRAVENOUS
  Filled 2014-03-31 (×13): qty 75

## 2014-03-31 MED ORDER — POTASSIUM CHLORIDE CRYS ER 20 MEQ PO TBCR
40.0000 meq | EXTENDED_RELEASE_TABLET | Freq: Two times a day (BID) | ORAL | Status: AC
  Administered 2014-03-31 (×2): 40 meq via ORAL
  Filled 2014-03-31 (×2): qty 2

## 2014-03-31 MED ORDER — POTASSIUM CHLORIDE 10 MEQ/100ML IV SOLN
10.0000 meq | INTRAVENOUS | Status: AC
  Administered 2014-03-31 (×3): 10 meq via INTRAVENOUS
  Filled 2014-03-31 (×3): qty 100

## 2014-03-31 NOTE — Progress Notes (Signed)
Utilization Review Completed.  

## 2014-03-31 NOTE — Progress Notes (Signed)
CRITICAL VALUE ALERT  Critical value received: K+, CO2 Date of notification:  03/31/2014 Time of notification:  0030  Critical value read back:yes  Nurse who received alert: Jovita Kussmaul MD notified (1st page): K.Schorr  Time of first page: 0031  MD notified (2nd page):  Time of second page:  Responding MD:  K.Schorr Time MD responded:  [phone]

## 2014-03-31 NOTE — Progress Notes (Signed)
Patient ID: Natasha [name], female    DOB: 1946-04-16, 68 y.o.   MRN: [id number] Nephrology Progress Note  S:Patient states she had an episode of large volume emesis last night. Non-bloody. Has also had one episode of loose stool that was also non-bloody. She has been urinating.   O:BP 130/66 mmHg  Pulse 98  Temp(Src) 99 F (37.2 C) (Oral)  Resp 31  Ht 5' 4"  (1.626 m)  Wt 108 lb 11 oz (49.3 kg)  BMI 18.65 kg/m2  SpO2 93%  Intake/Output Summary (Last 24 hours) at 03/31/14 0102 Last data filed at 03/31/14 0505  Gross per 24 hour  Intake    405 ml  Output   1025 ml  Net   -620 ml   Intake/Output: I/O last 3 completed shifts: In: 405 [I.V.:205; IV Piggyback:200] Out: 675 [Urine:675]  Intake/Output this shift:  Total I/O In: -  Out: 825 [Urine:825] Weight change: 1 lb 11 oz (0.765 kg) Gen: Sleeping in bed, easily arousable and appears comfortable in no distress VOZ:DGUYQIH rate and rhythm without murmur Resp:Clear to auscultation bilaterally without rales or wheezing KVQ:QVZD, non-tender, +bowel sounds GLO:VFIEP edema bilaterally up to thighs   Recent Labs Lab 03/29/14 1941 03/29/14 1954 03/30/14 0735 03/30/14 1630 03/30/14 2308 03/31/14 0313  NA 144 146 147 144 147 152*  K 2.9* 2.8* 3.3* 3.3* 2.9* 3.1*  CL 115* 123* 122* 120* 119* 122*  CO2 8*  --  7* <7* 10* 11*  GLUCOSE 86 87 97 105* 97 76  BUN 60* 70* 57* 57* 56* 56*  CREATININE 5.94* 7.30* 5.83* 5.76* 5.76* 5.78*  ALBUMIN  --   --  3.0* 3.0*  3.0*  --  2.7*  CALCIUM 6.6*  --  6.1* 6.1* 7.9* 7.9*  PHOS  --   --   --  3.9  --  3.2  AST  --   --  14 17  --  14  ALT  --   --  9 9  --  8   Liver Function Tests:  Recent Labs Lab 03/30/14 0735 03/30/14 1630 03/31/14 0313  AST 14 17 14   ALT 9 9 8   ALKPHOS 171* 177* 158*  BILITOT 0.3 0.4 0.6  PROT 6.2 6.4 5.8*  ALBUMIN 3.0* 3.0*  3.0* 2.7*    Recent Labs Lab 03/30/14 1630  LIPASE 208*   No results for input(s): AMMONIA in the last 168  hours. CBC:  Recent Labs Lab 03/29/14 1941 03/29/14 1954 03/30/14 0735 03/31/14 0313  WBC 9.6  --  10.5 6.6  NEUTROABS 5.8  --  7.6 4.6  HGB 11.9* 13.3 11.1* 10.6*  HCT 36.4 39.0 34.4* 31.7*  MCV 95.3  --  96.1 92.7  PLT 181  --  152 171   Studies/Results: Ct Abdomen Pelvis Wo Contrast  03/29/2014   CLINICAL DATA:  68 year old female with generalized abdominal pain, nausea, vomiting and diarrhea for 1 day. 16-17 lb weight loss since June 2015. History of Crohn's disease. Evaluate for possible pancreatitis.  EXAM: CT ABDOMEN AND PELVIS WITHOUT CONTRAST  TECHNIQUE: Multidetector CT imaging of the abdomen and pelvis was performed following the standard protocol without IV contrast.  COMPARISON:  05/16/2013.  FINDINGS: Lower chest:  Unremarkable.  Hepatobiliary: Status post cholecystectomy. No definite focal cystic or solid hepatic lesions on today's non contrast CT examination.  Pancreas: Unremarkable.  Spleen: Unremarkable.  Adrenals/Urinary Tract: Normal appearance of the adrenal glands bilaterally. Multiple nonobstructive calculi are present within the collecting systems of the  kidneys bilaterally, largest of which measures up to 6 mm in the upper pole collecting system of the right kidney. No additional calculi are noted along the course of either ureter or within the lumen of the urinary bladder. No hydroureteronephrosis or perinephric stranding to indicate urinary tract obstruction at this time. Urinary bladder is normal in appearance. Several small low-attenuation lesions are noted in the kidneys bilaterally, the largest of which is exophytic measuring 1.7 x 1.1 cm in the lateral aspect of the lower pole of the left kidney. Although these are incompletely characterized on today's non contrast CT examination, they are similar to the prior examination, and therefore favored to represent cysts.  Stomach/Bowel: Small diverticulum from the cardia of the stomach. No pathologic dilatation of small  bowel or colon. Status post right hemicolectomy. The small bowel is generally unremarkable in appearance. Specifically, no gross inflammatory changes associated with small bowel or small bowel mesenteric noted at this time.  Vascular/Lymphatic: Atherosclerosis throughout the abdominal and pelvic vasculature, without evidence of aneurysm. No pathologically enlarged lymph nodes are noted in the abdomen or pelvis on today's non contrast CT examination.  Reproductive: Status post hysterectomy. Ovaries are not confidently identified and may be surgically absent or atrophic.  Other: No significant volume of ascites.  No pneumoperitoneum.  Musculoskeletal: There are no aggressive appearing lytic or blastic lesions noted in the visualized portions of the skeleton.  IMPRESSION: 1. Status post right hemicolectomy. No gross inflammatory changes associated with the small bowel or colon on today's noncontrast CT examination to strongly suggest presence of an acute Crohn's exacerbation. 2. The pancreas is normal in appearance on today's noncontrast examination. 3. Numerous nonobstructive calculi within the collecting systems of the kidneys bilaterally, largest of which measures up to 6 mm in the upper pole collecting system of the right kidney. No ureteral stones or findings of urinary tract obstruction are noted at this time. 4. Additional incidental findings, as above.   Electronically Signed   By: Vinnie Langton M.D.   On: 03/29/2014 16:56   Dg Chest 2 View  03/29/2014   CLINICAL DATA:  Acute renal failure with weakness  EXAM: CHEST  2 VIEW  COMPARISON:  July 06, 2013  FINDINGS: There is underlying emphysematous change. There is no edema or consolidation. Heart is upper normal in size with pulmonary vascularity within normal limits. No adenopathy. There are surgical clips in the region of the thyroid. No bone lesions.  IMPRESSION: Underlying emphysematous change.  No edema or consolidation.   Electronically Signed    By: Lowella Grip M.D.   On: 03/29/2014 21:41   . calcitRIOL  0.5 mcg Oral Daily  . fluticasone  2 puff Inhalation BID  . heparin  5,000 Units Subcutaneous 3 times per day  . levothyroxine  125 mcg Oral QAC breakfast  . predniSONE  5 mg Oral Q breakfast    Background: Patient is a 68yo female with a history of CKD3, COPD, and Crohn's disease admitted for AKI with anion gap acidosis, profound hypokalemia, hypomagnesemia after several days of diarrhea, followed by a week or more of inability to eat, drink or take her bicarb/K supplements as an outpt.  Has baseline CKD 4 (creatinine 2.98 when she last saw Dr. Mercy Moore 8/15). On presentation to the ED she was found to be acidemic on ABG with pH 7.038 pCO2 18, K 2.9, Mg was 1.3 and creatinine around 5.7.   Suspect possible AKI due to hypovolemia when looking at patient's overall clinical picture.  Started on isotonic bicarbonate for acidosis on 11/5, and K/Mg supplementation. Non-oliguric.   Assessment/Plan:  1. AKI with anion gap metabolic acidosis: Unsure of the cause for her AKI, however, likely hypovolemia with patient's initial tachycardia with high sodium and clear chest x-ray. Renal function hasn't really changed with IVF but pt not overly hydrated. With rise in serum sodium, yet continued need for bicarb infusion, to make fluid administration less complicated and give more free water (getting 2 different IVF right now) will change to 1/2 isotonic bicarb at rate of 150, remove the 1/2 NS so all fluid will be essentially "1/2 normal".   Continue to monitor fluid status. Follow-up repeat renal function panel in AM. Hopefully will see some improvement in renal function soon. 2. CKD3: Followed by Dr. Mercy Moore outpatient. Last seen in 12/2013 with a creatinine of 2.93. . 3. Hypernatremia: sodium on admission of 144 that has increased to 152 this morning (11/6). Patient was initially on NS IVF and switched to 1/2NS with additional isotonic  bicarbonate. See above for recommended fluid changes 4. Non-obstructive nephrolithiasis: bilateral. Seen on CT scan. Do not suspect this is causing AKI. 5. Hypomagnesemia: 1.0 on admission. s/p magnesium sulfate 2g IV. Peaked at 3.3 and has come down to 3.0 this morning 6. Hypokalemia: 2.9 on admission. S/p NS w/ K+28mq at 1270mhr and improved slowly to 3.1 today (11/6). S/p 7 runs of K+ 1036mIV. Recommend K 38m89m2 and reassess. 7. Chron's disease with no evidence of acute exacerbation on CT (11/4). Patient actually more constipated than usual (having about 0-1 BMs per day down from around 4 per day). Management per primary. Does note that she had severe diarrhea couple weeks back then poor po since due to n/v 8. Hypothyroidism: on synthroid. Management per primary team 9. COPD exacerbation: per primary 10. 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 NettLonny Prudeh highlighted additions. See above for fluid recommendations. Needs more free water and less complicated IV regimen. No improvement in renal function as of yet but no evidence volume excess, presume still on the dry side, hope for some recovery. Please note most recent baseline 12/2013 was 2.93 when she saw Dr. MattMercy MooreCKA.[facility] HospitalUNHAM,Lajuan Godbee B,MD 03/31/2014 11:19 AM

## 2014-03-31 NOTE — Progress Notes (Addendum)
INITIAL NUTRITION ASSESSMENT  DOCUMENTATION CODES Per approved criteria  -Severe malnutrition in the context of chronic illness   INTERVENTION: Resource Breeze po TID, each supplement provides 250 kcal and 9 grams of protein RD to follow for nutrition care plan  NUTRITION DIAGNOSIS: Increased nutrient needs related to malnutrition, repletion as evidenced by estimated nutrition needs  Goal: Pt to meet >/= 90% of their estimated nutrition needs   Monitor:  PO & supplemental intake, weight, labs, I/O's  Reason for Assessment: Malnutrition Screening Tool Report  68 y.o. female  Admitting Dx: abdominal pain, AKI  ASSESSMENT: 68 y.o. year old Female with PMH of stage 4 CKD, Crohn's disease s/p colectomy, COPD, HTN presenting with abd pain, AKI, AGMA, hypokalemia, nephrolithiasis.   CT showed no acute findings concerning for colitis of pancreatitis. Did show multiple bilateral non obstructing kidney stones.   Pt not very talkative upon RD visit.  Upset that she's been told several things today that have not happened.  Pt with hx of weight loss and diarrhea related to Crohn's.  Weight has continued to trend down since May 2015.  Currently on Clear Liquids.  No % PO intake available per flowsheet records.  Malnutrition ongoing.  Would benefit from oral nutrition supplements.  RD to order.  Nutrition Focused Physical Exam:  Subcutaneous Fat:  Orbital Region: moderate wasting Upper Arm Region: severe wasting Thoracic and Lumbar Region: severe wasting  Muscle:  Temple Region: severe wasting Clavicle Bone Region: severe wasting Clavicle and Acromion Bone Region: severe wasting Scapular Bone Region: severe wasting Dorsal Hand: severe wasting Patellar Region: moderate wasting Anterior Thigh Region: moderate wasting Posterior Calf Region: moderate wasting  Edema: none  Patient continues to meet criteria for severe malnutrition in the context of chronic illness as evidenced by  severe muscle loss and severe subcutaneous fat loss.  Height: Ht Readings from Last 1 Encounters:  03/30/14 5' 4"  (1.626 m)    Weight: Wt Readings from Last 1 Encounters:  03/30/14 108 lb 11 oz (49.3 kg)    Ideal Body Weight: 120 lb  % Ideal Body Weight: 90%  Wt Readings from Last 10 Encounters:  03/30/14 108 lb 11 oz (49.3 kg)  03/03/14 110 lb (49.896 kg)  11/01/13 113 lb (51.256 kg)  09/28/13 120 lb (54.432 kg)  09/20/13 119 lb (53.978 kg)  05/18/13 118 lb 6.4 oz (53.706 kg)  06/14/12 150 lb 12.7 oz (68.4 kg)  03/04/12 127 lb (57.607 kg)    Usual Body Weight: 120 lb -- May 2015  % Usual Body Weight: 90%  BMI:  Body mass index is 18.65 kg/(m^2).  Estimated Nutritional Needs: Kcal: 1400-1600 Protein: 70-80 gm Fluid: >/= 1.5 L  Skin: Intact  Diet Order: Diet clear liquid  EDUCATION NEEDS: -No education needs identified at this time   Intake/Output Summary (Last 24 hours) at 03/31/14 1038 Last data filed at 03/31/14 0845  Gross per 24 hour  Intake 1992.5 ml  Output   1775 ml  Net  217.5 ml    Labs:   Recent Labs Lab 03/30/14 1630 03/30/14 2308 03/31/14 0313  NA 144 147 152*  K 3.3* 2.9* 3.1*  CL 120* 119* 122*  CO2 <7* 10* 11*  BUN 57* 56* 56*  CREATININE 5.76* 5.76* 5.78*  CALCIUM 6.1* 7.9* 7.9*  MG 1.3* 3.3* 3.0*  PHOS 3.9  --  3.2  GLUCOSE 105* 97 76    Scheduled Meds: . calcitRIOL  0.5 mcg Oral Daily  . fluticasone  2 puff  Inhalation BID  . heparin  5,000 Units Subcutaneous 3 times per day  . levothyroxine  125 mcg Oral QAC breakfast  . predniSONE  5 mg Oral Q breakfast    Continuous Infusions: . sodium chloride 75 mL/hr at 03/31/14 0857  .  sodium bicarbonate  infusion 1000 mL 75 mL/hr at 03/30/14 1902    Past Medical History  Diagnosis Date  . Chronic kidney disease   . GERD (gastroesophageal reflux disease)   . Crohn's colitis   . CKD (chronic kidney disease) 06/09/2012  . Hypothyroidism 06/10/2012  . Arthritis   .  Osteoporosis   . Hypertension   . Plantar warts   . Postmenopausal   . Glaucoma   . Bulging disc     L3-L4  . Atypical chest pain   . Emphysema/COPD     Past Surgical History  Procedure Laterality Date  . Colon resection    . Esophagogastroduodenoscopy  03/04/2012    Procedure: ESOPHAGOGASTRODUODENOSCOPY (EGD);  Surgeon: Garlan Fair, MD;  Location: [facility];  Service: Endoscopy;  Laterality: N/A;  . Flexible sigmoidoscopy  03/04/2012    Procedure: FLEXIBLE SIGMOIDOSCOPY;  Surgeon: Garlan Fair, MD;  Location: [facility];  Service: Endoscopy;  Laterality: N/A;  . Abdominal hysterectomy    . Appendectomy    . Thyroidectomy      Arthur Holms, RD, LDN Pager #: [phone] After-Hours Pager #: [phone]

## 2014-03-31 NOTE — Progress Notes (Signed)
Critical values called to K. Schorr after midnight. New orders received. Total of 6 runs of potassium given via peripheral IV. Pt c/o nausea and then vomited brown emesis. Prn zofran given with relief obtained. Pt had one watery, brown stool this am. Will continue to monitor.  Ladell Heads, RN

## 2014-03-31 NOTE — Progress Notes (Addendum)
Patient ID: Natasha [name], female   DOB: 02-16-1946, 68 y.o.   MRN: [id number]  [facility] PROGRESS NOTE  Natasha [name] JKD:[id number] DOB: Jul 03, 1945 DOA: 03/29/2014 PCP: Cloyd Stagers, MD  Brief narrative: Patient is 68 year old female with CKD IV (baseline creatinine 2.2), Crohn's disease status post colectomy, COPD, hypertension, nephrolithiasis, presented to [facility]29/2014 with main concern of several day duration of progressively worsening abdominal pain with no specific alleviating or aggravating factors, associated with nausea, poor oral intake.she saw PCP, CT abdomen requested and findings consistent with numerous nonobstructive calculi within the collecting system bilaterally, largest L6 millimeter in diameter, right kidney. Followed by Dr Mercy Moore outpt for CKD, takes Lasix at home as needed but has not taken it for the past 2 weeks.  Assessment and Plan:   Acute on chronic renal failure with anion gap metabolic acidosis - etiology still fairly unclear, possible hypovolemia - Creatinine fairly unchanged since admission - Appreciate nephrology team following, plan to change fluids to half eyes a tonic bicarbonate - Repeat BMP in the morning Hypernatremia - Increase in sodium since admission: 144 --> 147 -->152 - IV fluids changed as noted above, repeat BMP in the morning Non-obstructive nephrolithiasis: bilateral - Seen on CT scan. Do not suspect this is causing AKI Hypomagnesemia - supplemented and within normal limits this morning Hypokalemia - continue to supplement and repeat BMP in the morning Anemia of chronic kidney disease - Slight drop in hemoglobin since admission, possible dilutional effect from IV fluids - Repeat CBC in the morning Chron's disease with no evidence of acute exacerbation on CT (11/4).  - Appears to be clinically stable at this time - continue home regimen with steroids Hypothyroidism - continue Synthroid COPD  exacerbation - Clinically stable this morning, patient maintaining oxygen saturation at target range - Spiriva daily - Qvar 80 g 2 puffs daily(Flovent substituted for Qvar) - albuterol QID PRN  Secondary hyperparathyroidism  - on po calcitriol Chronic diastolic CHF - last 2-D echo February 2015 with EF 50% and stage II diastolic CHF - at home on Lasix 40 mg daily but held while inpatient  - weight trend since admission: 107 lbs --> 108 lbs - Will continue to monitor daily weights, strict intake and output HTN - reasonable inpatient control  Hypocalcemia - calcium gluconate 4 gm provided and Ca imrpoving  Severe protein calorie malnutrition - Advance diet as patient able to tolerate  DVT prophylaxis  Heparin SQ while pt is in hospital  Code Status: Full Family Communication: Pt at bedside Disposition Plan: Remains inpatient   IV Access:   Peripheral IV Procedures and diagnostic studies:    Ct Abdomen Pelvis Wo Contrast  03/29/2014  Status post right hemicolectomy. No gross inflammatory changes associated with the small bowel or colon on today's noncontrast CT examination to strongly suggest presence of an acute Crohn's exacerbation. 2. The pancreas is normal in appearance on today's noncontrast examination. 3. Numerous nonobstructive calculi within the collecting systems of the kidneys bilaterally, largest of which measures up to 6 mm in the upper pole collecting system of the right kidney. No ureteral stones or findings of urinary tract obstruction are noted at this time.   Dg Chest 2 View  03/29/2014   Underlying emphysematous change.  No edema or consolidation.  Medical Consultants:   Nephrology  Other Consultants:   Physical therapy  Anti-Infectives:   None   Faye Ramsay, MD  [facility] Pager [phone]  If 7PM-7AM, please contact night-coverage [email or web address] Password  [email or web address] 03/31/2014, 12:07 PM   LOS: 2 days   HPI/Subjective: No events overnight.    Objective: Filed Vitals:   03/31/14 0600 03/31/14 0700 03/31/14 0800 03/31/14 0920  BP: 115/65 134/56 125/62   Pulse: 98 94 90   Temp:   99.8 F (37.7 C)   TempSrc:   Oral   Resp: 11 15 13    Height:      Weight:      SpO2: 98% 98% 98% 98%    Intake/Output Summary (Last 24 hours) at 03/31/14 1207 Last data filed at 03/31/14 0845  Gross per 24 hour  Intake 1992.5 ml  Output   1775 ml  Net  217.5 ml    Exam:   General:  Pt is alert, follows commands appropriately, not in acute distress  Cardiovascular: Regular rate and rhythm, S1/S2, no murmurs, no rubs, no gallops  Respiratory: Clear to auscultation bilaterally, no wheezing, diminished breath sounds at bases  Abdomen: Soft, non tender, non distended, bowel sounds present, no guarding  Extremities: pulses DP and PT palpable bilaterally  Neuro: Grossly nonfocal  Data Reviewed: Basic Metabolic Panel:  Recent Labs Lab 03/29/14 1941 03/29/14 1954 03/30/14 0735 03/30/14 1630 03/30/14 2308 03/31/14 0313  NA 144 146 147 144 147 152*  K 2.9* 2.8* 3.3* 3.3* 2.9* 3.1*  CL 115* 123* 122* 120* 119* 122*  CO2 8*  --  7* <7* 10* 11*  GLUCOSE 86 87 97 105* 97 76  BUN 60* 70* 57* 57* 56* 56*  CREATININE 5.94* 7.30* 5.83* 5.76* 5.76* 5.78*  CALCIUM 6.6*  --  6.1* 6.1* 7.9* 7.9*  MG 1.0*  --  1.5 1.3* 3.3* 3.0*  PHOS  --   --   --  3.9  --  3.2   Liver Function Tests:  Recent Labs Lab 03/30/14 0735 03/30/14 1630 03/31/14 0313  AST 14 17 14   ALT 9 9 8   ALKPHOS 171* 177* 158*  BILITOT 0.3 0.4 0.6  PROT 6.2 6.4 5.8*  ALBUMIN 3.0* 3.0*  3.0* 2.7*    Recent Labs Lab 03/30/14 1630  LIPASE 208*   CBC:  Recent Labs Lab 03/29/14 1941 03/29/14 1954 03/30/14 0735 03/31/14 0313  WBC 9.6  --  10.5 6.6  NEUTROABS 5.8  --  7.6 4.6  HGB 11.9* 13.3 11.1* 10.6*  HCT 36.4 39.0 34.4* 31.7*  MCV 95.3  --  96.1 92.7  PLT 181  --  152 171    Scheduled Meds: . calcitRIOL  0.5 mcg Oral Daily  . fluticasone  2  puff Inhalation BID  . heparin  5,000 Units Subcutaneous 3 times per day  . levothyroxine  125 mcg Oral QAC breakfast  . potassium chloride  40 mEq Oral BID  . predniSONE  5 mg Oral Q breakfast   Continuous Infusions: .  sodium bicarbonate  infusion 1000 mL

## 2014-04-01 LAB — GLUCOSE, CAPILLARY: Glucose-Capillary: 107 mg/dL — ABNORMAL HIGH (ref 70–99)

## 2014-04-01 LAB — COMPREHENSIVE METABOLIC PANEL
ALT: 7 U/L (ref 0–35)
ANION GAP: 19 — AB (ref 5–15)
AST: 14 U/L (ref 0–37)
Albumin: 2.4 g/dL — ABNORMAL LOW (ref 3.5–5.2)
Alkaline Phosphatase: 123 U/L — ABNORMAL HIGH (ref 39–117)
BUN: 52 mg/dL — ABNORMAL HIGH (ref 6–23)
CALCIUM: 6.6 mg/dL — AB (ref 8.4–10.5)
CO2: 14 meq/L — AB (ref 19–32)
CREATININE: 5.53 mg/dL — AB (ref 0.50–1.10)
Chloride: 113 mEq/L — ABNORMAL HIGH (ref 96–112)
GFR calc Af Amer: 8 mL/min — ABNORMAL LOW (ref >90)
GFR calc non Af Amer: 7 mL/min — ABNORMAL LOW (ref >90)
GLUCOSE: 88 mg/dL (ref 70–99)
Potassium: 2.9 mEq/L — CL (ref 3.7–5.3)
Sodium: 146 mEq/L (ref 137–147)
Total Bilirubin: 0.4 mg/dL (ref 0.3–1.2)
Total Protein: 5 g/dL — ABNORMAL LOW (ref 6.0–8.3)

## 2014-04-01 LAB — CBC WITH DIFFERENTIAL/PLATELET
Basophils Absolute: 0 10*3/uL (ref 0.0–0.1)
Basophils Relative: 0 % (ref 0–1)
Eosinophils Absolute: 0.1 10*3/uL (ref 0.0–0.7)
Eosinophils Relative: 1 % (ref 0–5)
HCT: 26.3 % — ABNORMAL LOW (ref 36.0–46.0)
Hemoglobin: 8.6 g/dL — ABNORMAL LOW (ref 12.0–15.0)
LYMPHS ABS: 1.7 10*3/uL (ref 0.7–4.0)
Lymphocytes Relative: 30 % (ref 12–46)
MCH: 31 pg (ref 26.0–34.0)
MCHC: 32.7 g/dL (ref 30.0–36.0)
MCV: 94.9 fL (ref 78.0–100.0)
Monocytes Absolute: 0.5 10*3/uL (ref 0.1–1.0)
Monocytes Relative: 9 % (ref 3–12)
NEUTROS ABS: 3.3 10*3/uL (ref 1.7–7.7)
NEUTROS PCT: 60 % (ref 43–77)
Platelets: 141 10*3/uL — ABNORMAL LOW (ref 150–400)
RBC: 2.77 MIL/uL — AB (ref 3.87–5.11)
RDW: 15.6 % — ABNORMAL HIGH (ref 11.5–15.5)
WBC: 5.6 10*3/uL (ref 4.0–10.5)

## 2014-04-01 LAB — URINE CULTURE: Colony Count: 50000

## 2014-04-01 LAB — MAGNESIUM: MAGNESIUM: 2.1 mg/dL (ref 1.5–2.5)

## 2014-04-01 MED ORDER — POTASSIUM CHLORIDE CRYS ER 20 MEQ PO TBCR
20.0000 meq | EXTENDED_RELEASE_TABLET | Freq: Three times a day (TID) | ORAL | Status: DC
  Administered 2014-04-01 – 2014-04-03 (×7): 20 meq via ORAL
  Filled 2014-04-01 (×10): qty 1

## 2014-04-01 MED ORDER — POTASSIUM CHLORIDE 20 MEQ PO PACK: 20.0000 meq | PACK | Freq: Three times a day (TID) | ORAL | Status: DC

## 2014-04-01 MED ORDER — POTASSIUM CHLORIDE 10 MEQ/100ML IV SOLN
10.0000 meq | INTRAVENOUS | Status: AC
  Administered 2014-04-01 (×2): 10 meq via INTRAVENOUS
  Filled 2014-04-01 (×2): qty 100

## 2014-04-01 MED ORDER — SODIUM CHLORIDE 0.9 % IV SOLN
1.0000 g | Freq: Once | INTRAVENOUS | Status: AC
  Administered 2014-04-01: 1 g via INTRAVENOUS
  Filled 2014-04-01: qty 10

## 2014-04-01 MED ORDER — VITAMIN D (ERGOCALCIFEROL) 1.25 MG (50000 UNIT) PO CAPS
50000.0000 [IU] | ORAL_CAPSULE | ORAL | Status: DC
  Administered 2014-04-02 – 2014-04-05 (×2): 50000 [IU] via ORAL
  Filled 2014-04-01 (×2): qty 1

## 2014-04-01 NOTE — Progress Notes (Signed)
CRITICAL VALUE ALERT  Critical value received:  Calcium, Total (PTH) 5.9  Date of notification:  03/31/14   Time of notification:  1532 Critical value read back:Yes.    Nurse who received alert:  Rico Sheehan (RN)  MD notified (1st page):  Dr. Doyle Askew  Time of first page:  1600 on 03/31/14 (text on amion)  MD notified (2nd page):  Time of second page:  Responding MD:  N/A  Time MD responded:

## 2014-04-01 NOTE — Progress Notes (Signed)
Patient ID: Natasha [name], female    DOB: [birth year], 68 y.o.   MRN: [id number] Nephrology Progress Note  S: Patient states she feels a little better. She has been eating her meals. She had one episode of loose stool last night. No shortness of breath  O:BP 105/58 mmHg  Pulse 88  Temp(Src) 97.8 F (36.6 C) (Oral)  Resp 13  Ht 5' 4"  (1.626 m)  Wt 114 lb 3.2 oz (51.8 kg)  BMI 19.59 kg/m2  SpO2 98%  Intake/Output Summary (Last 24 hours) at 04/01/14 0867 Last data filed at 04/01/14 0518  Gross per 24 hour  Intake      0 ml  Output   1101 ml  Net  -1101 ml   Intake/Output: I/O last 3 completed shifts: In: 1992.5 [I.V.:1792.5; IV [id number]:712] Out: 2175 [Urine:1725; Emesis/NG output:100; Stool:350]  Intake/Output this shift:  Total I/O In: -  Out: 401 [Urine:400; Stool:1] Weight change: 5 lb 8.2 oz (2.5 kg) Gen: Laying in bed, watching TV in no distress WPY:KDXIPJA rate and rhythm without murmur Resp:Clear to auscultation bilaterally without rales or wheezing SNK:NLZJ, non-tender, +bowel sounds QBH:ALPFX edema bilaterally   Recent Labs Lab 03/29/14 1941 03/29/14 1954 03/29/14 2304 03/30/14 0735 03/30/14 1630 03/30/14 2308 03/31/14 0313 04/01/14 0335  NA 144 146  --  147 144 147 152* 146  K 2.9* 2.8*  --  3.3* 3.3* 2.9* 3.1* 2.9*  CL 115* 123*  --  122* 120* 119* 122* 113*  CO2 8*  --   --  7* <7* 10* 11* 14*  GLUCOSE 86 87  --  97 105* 97 76 88  BUN 60* 70*  --  57* 57* 56* 56* 52*  CREATININE 5.94* 7.30*  --  5.83* 5.76* 5.76* 5.78* 5.53*  ALBUMIN  --   --   --  3.0* 3.0*  3.0*  --  2.7* 2.4*  CALCIUM 6.6*  --  5.9* 6.1* 6.1* 7.9* 7.9* 6.6*  PHOS  --   --   --   --  3.9  --  3.2  --   AST  --   --   --  14 17  --  14 14  ALT  --   --   --  9 9  --  8 7   :  Recent Labs Lab 03/30/14 1630 03/31/14 0313 04/01/14 0335  AST 17 14 14   ALT 9 8 7   ALKPHOS 177* 158* 123*  BILITOT 0.4 0.6 0.4  PROT 6.4 5.8* 5.0*  ALBUMIN 3.0*  3.0* 2.7* 2.4*   Results  for Natasha [name], Natasha [name] (MRN [id number]) as of 04/01/2014 10:39  Ref. Range 03/29/2014 23:04  Vit D, 25-Hydroxy Latest Range: 30-89 ng/mL 20 (L)    Recent Labs Lab 03/30/14 1630  LIPASE 208*    Recent Labs Lab 03/29/14 1941  03/30/14 0735 03/31/14 0313 04/01/14 0335  WBC 9.6  --  10.5 6.6 5.6  NEUTROABS 5.8  --  7.6 4.6 3.3  HGB 11.9*  < > 11.1* 10.6* 8.6*  HCT 36.4  < > 34.4* 31.7* 26.3*  MCV 95.3  --  96.1 92.7 94.9  PLT 181  --  152 171 141*  < > = values in this interval not displayed.    Medications . calcitRIOL  0.5 mcg Oral Daily  . feeding supplement (RESOURCE BREEZE)  1 Container Oral TID BM  . fluticasone  2 puff Inhalation BID  . heparin  5,000 Units Subcutaneous 3 times per day  .  levothyroxine  125 mcg Oral QAC breakfast  . potassium chloride  10 mEq Intravenous Q1 Hr x 2  . predniSONE  5 mg Oral Q breakfast   .  sodium bicarbonate  infusion 1000 mL 150 mL/hr at 04/01/14 0617   Background: 68yo female with a history of CKD4, COPD, and Crohn's disease admitted for AKI with anion gap acidosis, profound hypokalemia, hypomagnesemia after several days of diarrhea, followed by a week or more of inability to eat, drink or take her bicarb/K supplements as an outpt.  Has baseline CKD 4 (creatinine 2.93 when she last saw Dr. Mercy Moore 8/15). On presentation to the ED she was found to be acidemic on ABG with pH 7.038 pCO2 18, K 2.9, Mg was 1.3 and creatinine around 5.7.   Suspect possible AKI due to hypovolemia when looking at patient's overall clinical picture. Started on isotonic bicarbonate for acidosis on 11/5, and K/Mg supplementation and switched to hypotonic (47mq) sodium bicarbonate to allow for more free water due to hypernatremia. Non-oliguric.   Assessment/Plan:  1. AKI with anion gap metabolic acidosis: Unsure of the cause for her AKI, however, likely hypovolemia with patient's initial tachycardia with high sodium and clear chest x-ray. Renal function hasn't really  changed with IVF but pt not overly hydrated. Creatinine a bit lower but with negligible improvement in GFR. Patient is still on the dryer side, however, dialysis may be in the near future. Continue D5 bicarb 788m @150ml /hr. UOP is adequate. CO2 improved to 14 (11/7) Will continue to follow renal function 2. CKD4: Followed by Dr. MaMercy Mooreutpatient. Last seen in 12/2013 with a creatinine of 2.93. . 3. Hypernatremia: sodium on admission of 144 that has increased to 152 yesterday morning (11/6). Improved today down to 146 (11/7). Fluid as above. 4. Non-obstructive nephrolithiasis: bilateral. Seen on CT scan. Do not suspect this is causing AKI. 5. Hypocalcemia: Corrected to 7.9 today. S/p calcium gluconate 4g on 11/5. Vit D def. (see below) 6. Hypomagnesemia: 1.0 on admission. s/p magnesium sulfate 2g IV. Peaked at 3.3 and has normalized to 2.1. 7. Hypokalemia: Takes K 2022mBID at home. Potassium of 2.9 on admission. S/p potassium in IVF, 8 runs of K+ 37m40mV and Kdur 40me81m with consistent hypokalemia. Replete per primary (they have ordered 2 runs IV; her home dose needs to be continued as well) 8. Chron's disease with no evidence of acute exacerbation on CT (11/4). Patient actually more constipated than usual (having about 0-1 BMs per day down from around 4 per day). Management per primary. Does note that she had severe diarrhea couple weeks back then poor po since due to n/v 9. Hypothyroidism: on synthroid. Management per primary team 10. 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 Anemia - Hb has steadily dropped with hydration. I will check and see if CBC done with her last visit w/ Dr.Mattingly in August.  Need to evaluate iron status. Studies ordered. Check stools.  RalphCordelia PochePGY-2, [facility] 04/01/2014, 6:28 AM   I have seen and examined this patient and agree with plan and  assessment by Dr. NetteLonny Prude highlighted additions.  Acidosis slowly improving - continue 1/2 normal bicarb infusion (serum sodium improving as well).  Kidney function no real change yet.  Still K depleted and primary service has ordered additional IV supplements. She takes 20 mEq TID at home and this needs to be continued  as well. She has a 25-D deficiency that may be contributing to her hypocalcemia - ergo 50,000 twice a week ordered.  Finally, Hb has dropped with hydration.  Not sure of her baseline.  Iron studies and stool hemoccults ordered.  Carlos Quackenbush B,MD 04/01/2014 10:44 AM

## 2014-04-01 NOTE — Progress Notes (Signed)
Patient ID: Natasha [name], female   DOB: [birth year], 68 y.o.   MRN: [id number]  [facility] PROGRESS NOTE  KHADIJA THIER KTG:[id number] DOB: 1945/09/23 DOA: 03/29/2014 PCP: Cloyd Stagers, MD   Brief narrative: Patient is 68 year old female with CKD IV (baseline creatinine 2.2), Crohn's disease status post colectomy, COPD, hypertension, nephrolithiasis, presented to [facility] 03/29/2014 with main concern of several day duration of progressively worsening abdominal pain with no specific alleviating or aggravating factors, associated with nausea, poor oral intake.she saw PCP, CT abdomen requested and findings consistent with numerous nonobstructive calculi within the collecting system bilaterally, largest L6 millimeter in diameter, right kidney. Followed by Dr Mercy Moore outpt for CKD, takes Lasix at home as needed but has not taken it for the past 2 weeks.  Assessment and Plan:   Acute on chronic renal failure with anion gap metabolic acidosis - etiology still fairly unclear, possible hypovolemia - Creatinine fairly unchanged since admission - Appreciate nephrology team following, continue sodium bicarb  - Repeat BMP in the morning Hypernatremia - Increase in sodium since admission: 144 --> 147 -->152 --> 146  - IV fluids as noted above, repeat BMP in the morning Non-obstructive nephrolithiasis: bilateral - Seen on CT scan. Do not suspect this is causing AKI Hypomagnesemia - supplemented and within normal limits this morning Hypokalemia - continue to supplement and repeat BMP in the morning - will resume home medical regimen with K-Dur 20 MEQ TID PO Anemia of chronic kidney disease - Slight drop in hemoglobin since admission, possible dilutional effect from IV fluids - no signs of active bleeding  - Repeat CBC in the morning Chron's disease with no evidence of acute exacerbation on CT (11/4).  - Appears to be clinically stable at this time - continue home  regimen with steroids - advance diet as pt able to tolerate  Hypothyroidism - continue Synthroid COPD exacerbation - Clinically stable this morning, patient maintaining oxygen saturation at target range - Spiriva daily - Qvar 80 g 2 puffs daily(Flovent substituted for Qvar) - albuterol QID PRN  Secondary hyperparathyroidism  - on po calcitriol Chronic diastolic CHF - last 2-D echo February 2015 with EF 50% and stage II diastolic CHF - at home on Lasix 40 mg daily but held while inpatient  - weight trend since admission: 107 lbs --> 108 lbs --> 114 lbs this AM  - Will continue to monitor daily weights, strict intake and output HTN - reasonable inpatient control  Hypocalcemia - calcium gluconate 4 gm provided and Ca imrpoving  - give one more dose this AM Severe protein calorie malnutrition - Advance diet as patient able to tolerate  DVT prophylaxis  Heparin SQ while pt is in hospital  Code Status: Full Family Communication: Pt at bedside Disposition Plan: Remains inpatient   IV Access:    Peripheral IV Procedures and diagnostic studies:    Ct Abdomen Pelvis Wo Contrast 03/29/2014 Status post right hemicolectomy. No gross inflammatory changes associated with the small bowel or colon on today's noncontrast CT examination to strongly suggest presence of an acute Crohn's exacerbation. 2. The pancreas is normal in appearance on today's noncontrast examination. 3. Numerous nonobstructive calculi within the collecting systems of the kidneys bilaterally, largest of which measures up to 6 mm in the upper pole collecting system of the right kidney. No ureteral stones or findings of urinary tract obstruction are noted at this time.   Dg Chest 2 View 03/29/2014 Underlying emphysematous change. No edema or consolidation.  Medical  Consultants:    Nephrology  Other Consultants:    Physical therapy  Anti-Infectives:    None  Faye Ramsay, MD  [facility] Pager  [phone]  If 7PM-7AM, please contact night-coverage [email or web address] Password [email or web address] 04/01/2014, 1:08 PM   LOS: 3 days   HPI/Subjective: No events overnight.   Objective: Filed Vitals:   04/01/14 0403 04/01/14 0728 04/01/14 0736 04/01/14 1238  BP: 105/58  115/67 121/73  Pulse:   103 97  Temp: 97.8 F (36.6 C)  98.2 F (36.8 C) 98.5 F (36.9 C)  TempSrc: Oral  Oral Oral  Resp: 13  20 13   Height:      Weight:      SpO2: 98% 100% 99% 99%    Intake/Output Summary (Last 24 hours) at 04/01/14 1308 Last data filed at 04/01/14 0738  Gross per 24 hour  Intake      0 ml  Output   1001 ml  Net  -1001 ml    Exam:   General:  Pt is alert, follows commands appropriately, not in acute distress  Cardiovascular: Regular rate and rhythm, S1/S2, no murmurs, no rubs, no gallops  Respiratory: Clear to auscultation bilaterally, no wheezing, no crackles, no rhonchi  Abdomen: Soft, non tender, non distended, bowel sounds present, no guarding  Extremities: Trace bilateral LE edema, pulses DP and PT palpable bilaterally  Neuro: Grossly nonfocal  Data Reviewed: Basic Metabolic Panel:  Recent Labs Lab 03/30/14 0735 03/30/14 1630 03/30/14 2308 03/31/14 0313 04/01/14 0335  NA 147 144 147 152* 146  K 3.3* 3.3* 2.9* 3.1* 2.9*  CL 122* 120* 119* 122* 113*  CO2 7* <7* 10* 11* 14*  GLUCOSE 97 105* 97 76 88  BUN 57* 57* 56* 56* 52*  CREATININE 5.83* 5.76* 5.76* 5.78* 5.53*  CALCIUM 6.1* 6.1* 7.9* 7.9* 6.6*  MG 1.5 1.3* 3.3* 3.0* 2.1  PHOS  --  3.9  --  3.2  --    Liver Function Tests:  Recent Labs Lab 03/30/14 0735 03/30/14 1630 03/31/14 0313 04/01/14 0335  AST 14 17 14 14   ALT 9 9 8 7   ALKPHOS 171* 177* 158* 123*  BILITOT 0.3 0.4 0.6 0.4  PROT 6.2 6.4 5.8* 5.0*  ALBUMIN 3.0* 3.0*  3.0* 2.7* 2.4*    Recent Labs Lab 03/30/14 1630  LIPASE 208*   CBC:  Recent Labs Lab 03/29/14 1941 03/29/14 1954 03/30/14 0735 03/31/14 0313 04/01/14 0335  WBC 9.6  --  10.5 6.6  5.6  NEUTROABS 5.8  --  7.6 4.6 3.3  HGB 11.9* 13.3 11.1* 10.6* 8.6*  HCT 36.4 39.0 34.4* 31.7* 26.3*  MCV 95.3  --  96.1 92.7 94.9  PLT 181  --  152 171 141*   CBG:  Recent Labs Lab 03/31/14 2002 04/01/14 0014  GLUCAP 106* 107*    Recent Results (from the past 240 hour(s))  MRSA PCR Screening     Status: None   Collection Time: 03/30/14  3:20 PM  Result Value Ref Range Status   MRSA by PCR NEGATIVE NEGATIVE Final    Comment:        The GeneXpert MRSA Assay (FDA approved for NASAL specimens only), is one component of a comprehensive MRSA colonization surveillance program. It is not intended to diagnose MRSA infection nor to guide or monitor treatment for MRSA infections.   Culture, Urine     Status: None   Collection Time: 03/30/14  7:04 PM  Result Value Ref Range Status   Specimen Description URINE,  RANDOM  Final   Special Requests NONE  Final   Culture  Setup Time   Final    03/31/2014 02:30 Performed at [place]   Final    50,000 COLONIES/ML Performed at [facility]    Culture   Final    Multiple bacterial morphotypes present, none predominant. Suggest appropriate recollection if clinically indicated. Performed at [facility]    Report Status 04/01/2014 FINAL  Final     Scheduled Meds: . calcitRIOL  0.5 mcg Oral Daily  . feeding supplement (RESOURCE BREEZE)  1 Container Oral TID BM  . fluticasone  2 puff Inhalation BID  . heparin  5,000 Units Subcutaneous 3 times per day  . levothyroxine  125 mcg Oral QAC breakfast  . potassium chloride  20 mEq Oral TID  . predniSONE  5 mg Oral Q breakfast  . [START ON 04/02/2014] Vitamin D (Ergocalciferol)  50,000 Units Oral Once per day on Sun Wed   Continuous Infusions: .  sodium bicarbonate  infusion 1000 mL 150 mL/hr at 04/01/14 0093

## 2014-04-01 NOTE — Plan of Care (Signed)
Problem: Phase II Progression Outcomes Goal: Obtain order to discontinue catheter if appropriate Outcome: Not Applicable Date Met:  47/65/46 Goal: Other Phase II Outcomes/Goals Outcome: Not Applicable Date Met:  50/35/46

## 2014-04-01 NOTE — Plan of Care (Signed)
Problem: Phase II Progression Outcomes Goal: Vital signs remain stable Outcome: Completed/Met Date Met:  04/01/14     

## 2014-04-02 LAB — COMPREHENSIVE METABOLIC PANEL
ALBUMIN: 2.2 g/dL — AB (ref 3.5–5.2)
ALK PHOS: 110 U/L (ref 39–117)
ALT: 7 U/L (ref 0–35)
AST: 13 U/L (ref 0–37)
Anion gap: 14 (ref 5–15)
BILIRUBIN TOTAL: 0.4 mg/dL (ref 0.3–1.2)
BUN: 39 mg/dL — ABNORMAL HIGH (ref 6–23)
CO2: 24 meq/L (ref 19–32)
Calcium: 6.4 mg/dL — CL (ref 8.4–10.5)
Chloride: 105 mEq/L (ref 96–112)
Creatinine, Ser: 4.62 mg/dL — ABNORMAL HIGH (ref 0.50–1.10)
GFR calc Af Amer: 10 mL/min — ABNORMAL LOW (ref >90)
GFR, EST NON AFRICAN AMERICAN: 9 mL/min — AB (ref >90)
Glucose, Bld: 96 mg/dL (ref 70–99)
POTASSIUM: 2.6 meq/L — AB (ref 3.7–5.3)
Sodium: 143 mEq/L (ref 137–147)
Total Protein: 4.8 g/dL — ABNORMAL LOW (ref 6.0–8.3)

## 2014-04-02 LAB — CBC WITH DIFFERENTIAL/PLATELET
BASOS ABS: 0 10*3/uL (ref 0.0–0.1)
BASOS PCT: 0 % (ref 0–1)
Eosinophils Absolute: 0.1 10*3/uL (ref 0.0–0.7)
Eosinophils Relative: 1 % (ref 0–5)
HEMATOCRIT: 24.1 % — AB (ref 36.0–46.0)
HEMOGLOBIN: 8 g/dL — AB (ref 12.0–15.0)
LYMPHS PCT: 33 % (ref 12–46)
Lymphs Abs: 1.9 10*3/uL (ref 0.7–4.0)
MCH: 31 pg (ref 26.0–34.0)
MCHC: 33.2 g/dL (ref 30.0–36.0)
MCV: 93.4 fL (ref 78.0–100.0)
MONO ABS: 0.6 10*3/uL (ref 0.1–1.0)
MONOS PCT: 10 % (ref 3–12)
NEUTROS ABS: 3.2 10*3/uL (ref 1.7–7.7)
Neutrophils Relative %: 56 % (ref 43–77)
Platelets: 123 10*3/uL — ABNORMAL LOW (ref 150–400)
RBC: 2.58 MIL/uL — ABNORMAL LOW (ref 3.87–5.11)
RDW: 15.2 % (ref 11.5–15.5)
WBC: 5.7 10*3/uL (ref 4.0–10.5)

## 2014-04-02 LAB — PHOSPHORUS: Phosphorus: 2.7 mg/dL (ref 2.3–4.6)

## 2014-04-02 LAB — MAGNESIUM: Magnesium: 1.6 mg/dL (ref 1.5–2.5)

## 2014-04-02 MED ORDER — POTASSIUM CHLORIDE 10 MEQ/100ML IV SOLN
10.0000 meq | Freq: Once | INTRAVENOUS | Status: AC
  Administered 2014-04-02: 10 meq via INTRAVENOUS
  Filled 2014-04-02: qty 100

## 2014-04-02 MED ORDER — SODIUM CHLORIDE 0.9 % IV SOLN
1.0000 g | Freq: Once | INTRAVENOUS | Status: AC
  Administered 2014-04-02: 1 g via INTRAVENOUS
  Filled 2014-04-02: qty 10

## 2014-04-02 MED ORDER — POTASSIUM CHLORIDE 10 MEQ/100ML IV SOLN
10.0000 meq | INTRAVENOUS | Status: AC
  Administered 2014-04-02 (×3): 10 meq via INTRAVENOUS
  Filled 2014-04-02 (×3): qty 100

## 2014-04-02 NOTE — Progress Notes (Signed)
Patient ID: Natasha [name], female    DOB: 01-05-1946, 68 y.o.   MRN: [id number] Nephrology Progress Note  S: Patient states she feels better. Nausea is improved with zofran. She is tolerating her meals well and is trying a diet today.   O:BP 111/65 mmHg  Pulse 80  Temp(Src) 98 F (36.7 C) (Axillary)  Resp 12  Ht 5' 4"  (1.626 m)  Wt 117 lb 11.6 oz (53.4 kg)  BMI 20.20 kg/m2  SpO2 97%  Intake/Output Summary (Last 24 hours) at 04/02/14 0558 Last data filed at 04/01/14 1652  Gross per 24 hour  Intake      0 ml  Output    525 ml  Net   -525 ml   Intake/Output: I/O last 3 completed shifts: In: -  Out: 1626 [Urine:1625; Stool:1]  Intake/Output this shift:    Weight change: 3 lb 8.4 oz (1.6 kg) Gen: Sitting in bed, in no distress, lotion at bedside EEF:EOFHQRF rate and rhythm without murmur Resp:Clear to auscultation bilaterally without rales or wheezing XJO:ITGP, non-tender, +bowel sounds QDI:YMEBR edema bilaterally Skin: very dry   Recent Labs Lab 03/29/14 1941 03/29/14 1954 03/29/14 2304 03/30/14 0735 03/30/14 1630 03/30/14 2308 03/31/14 0313 04/01/14 0335 04/02/14 0330  NA 144 146  --  147 144 147 152* 146 143  K 2.9* 2.8*  --  3.3* 3.3* 2.9* 3.1* 2.9* 2.6*  CL 115* 123*  --  122* 120* 119* 122* 113* 105  CO2 8*  --   --  7* <7* 10* 11* 14* 24  GLUCOSE 86 87  --  97 105* 97 76 88 96  BUN 60* 70*  --  57* 57* 56* 56* 52* 39*  CREATININE 5.94* 7.30*  --  5.83* 5.76* 5.76* 5.78* 5.53* 4.62*  ALBUMIN  --   --   --  3.0* 3.0*  3.0*  --  2.7* 2.4* 2.2*  CALCIUM 6.6*  --  5.9* 6.1* 6.1* 7.9* 7.9* 6.6* 6.4*  PHOS  --   --   --   --  3.9  --  3.2  --   --   AST  --   --   --  14 17  --  14 14 13   ALT  --   --   --  9 9  --  8 7 7    :  Recent Labs Lab 03/31/14 0313 04/01/14 0335 04/02/14 0330  AST 14 14 13   ALT 8 7 7   ALKPHOS 158* 123* 110  BILITOT 0.6 0.4 0.4  PROT 5.8* 5.0* 4.8*  ALBUMIN 2.7* 2.4* 2.2*   Results for Natasha [name] (MRN [id number])  as of 04/01/2014 10:39  Ref. Range 03/29/2014 23:04  Vit D, 25-Hydroxy Latest Range: 30-89 ng/mL 20 (L)    Recent Labs Lab 03/30/14 1630  LIPASE 208*    Recent Labs Lab 03/29/14 1941  03/30/14 0735 03/31/14 0313 04/01/14 0335 04/02/14 0330  WBC 9.6  --  10.5 6.6 5.6 5.7  NEUTROABS 5.8  --  7.6 4.6 3.3 3.2  HGB 11.9*  < > 11.1* 10.6* 8.6* 8.0*  HCT 36.4  < > 34.4* 31.7* 26.3* 24.1*  MCV 95.3  --  96.1 92.7 94.9 93.4  PLT 181  --  152 171 141* 123*  < > = values in this interval not displayed.    Medications . calcitRIOL  0.5 mcg Oral Daily  . feeding supplement (RESOURCE BREEZE)  1 Container Oral TID BM  . fluticasone  2  puff Inhalation BID  . heparin  5,000 Units Subcutaneous 3 times per day  . levothyroxine  125 mcg Oral QAC breakfast  . potassium chloride  20 mEq Oral TID  . predniSONE  5 mg Oral Q breakfast  . Vitamin D (Ergocalciferol)  50,000 Units Oral Once per day on Sun Wed   .  sodium bicarbonate  infusion 1000 mL 150 mL/hr at 04/01/14 2208   Background: 68yo female with a history of CKD4, COPD, and Crohn's disease admitted for AKI with anion gap acidosis, profound hypokalemia, hypomagnesemia after several days of diarrhea, followed by a week or more of inability to eat, drink or take her bicarb/K supplements as an outpt.  Has baseline CKD 4 (creatinine 2.93 when she last saw Dr. Mercy Moore 8/15). On presentation to the ED she was found to be acidemic on ABG with pH 7.038 pCO2 18 and venous CO2 <7, K 2.9, Mg was 1.3 and creatinine around 5.7.   Suspect possible AKI/ATN due to hypovolemia when looking at overall clinical picture. Started on isotonic bicarbonate for acidosis on 11/5, and K/Mg supplementation and switched to hypotonic (39mq) sodium bicarbonate to allow for more free water due to hypernatremia. Found to be 25-vitamin D deficient in addition to having hypocalcemia. Creatinine improving with persistent hypokalemia. Non-oliguric.   Assessment/Plan:  1. AKI  with anion gap metabolic acidosis: Anion gap has closed and metabolic acidosis has resolved. Unsure of the cause for her AKI, however, likely hypovolemia with patient's initial tachycardia with high sodium and clear chest x-ray. Renal function hasn't really changed with IVF but pt not overly hydrated. Creatinine continues to decrease (4.62 on 11/8). Patient is not fluid overloaded and can still tolerate IV hydration. Reduce hypotonic bicarbonate to 772mhr. UOP not recorded for last 12 hours. CO2 improved to 24 (11/8) Will continue to follow renal function. 2. CKD4: Followed by Dr. MaMercy Mooreutpatient. Last seen in 12/2013 with a creatinine of 2.93. . 3. Hypernatremia: Resolved. Fluid as above (hypotonic fluids). 4. Non-obstructive nephrolithiasis: bilateral. Seen on CT scan. Do not suspect this is causing AKI. 5. Hypocalcemia: Corrected to 7.9 today. S/p calcium gluconate 4g on 11/5 and 1g on 11/7. Vit D def. (see below) 6. Hypomagnesemia: 1.0 on admission. s/p magnesium sulfate 2g IV. Peaked to 3.3. Currently at 1.6 (11/8) 7. Hypokalemia: Takes K 206mBID at home. Potassium of 2.9 on admission. S/p repletion. Potassium continues to be low. She is on 20 po TID now and is getting 4 more runs of IV K today for potassium of 2.6. 8. Chron's disease with no evidence of acute exacerbation on CT (11/4). Patient actually more constipated than usual (having about 0-1 BMs per day down from around 4 per day). Management per primary. Does note that she had severe diarrhea couple weeks back then poor po since due to n/v 9. Hypothyroidism: on synthroid. Management per primary team 10. 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 Anemia - Hb has steadily dropped with hydration. Continues to drop to 8 today (11/8). I will check and see if CBC done with her last visit w/ Dr. MatMercy Moore August.  Will follow-up iron studies. FOBT  ordered but not yet obtained.  RalCordelia PocheD PGY-2, [facility] 04/02/2014, 5:58 AM   I have seen and examined this patient and agree with plan and assessment in the above not of Dr. NetLonny Prudeth highlighted additions.  Metabolic  disarray has improved - acidosis resolved, Mg now normal, K still requiring IV and po repletion.  Starting to eat a regular diet today.  Have cut her half isotonic bicarb rate to 75cc/hour and hope can stop fluids next 24 hours or so.  Finding an oral regimen to maintain K and CO2, and in particular one that she can tolerate for her chronic metabolic acidosis may be problematic.  She says "that sodium bicarb messes with my Crohn's" and she has been tried on Shohl's solution in the past and did not tolerate it (says she has a "whole closet full of that" at home). Once taking good po will need to address. Kidney function with some minimal improvement last 24 hours (baseline creatinine around 3 - today 4.7 from 5's. Probably had some ATN from volume depletion). Hb dropping with hydration, Fe studies pending and stool hemoccults ordered but not done)    Roshon Duell B,MD 04/02/2014 10:29 AM

## 2014-04-02 NOTE — Progress Notes (Signed)
Patient ID: Natasha [name], female   DOB: 25-Jan-1946, 68 y.o.   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 She saw PCP, CT abdomen requested and findings consistent with numerous nonobstructive calculi within the collecting system bilaterally, largest in the right kidney area 6 millimeter in diameter. Followed by Dr Mercy Moore outpt for CKD, takes Lasix at home as needed but has not taken it for the past 2 weeks PTA.  Assessment/Plan:    Acute on chronic renal failure with anion gap metabolic acidosis / ATN  - etiology still fairly unclear, possible hypovolemia - Creatinine 5.78 --> 4.62 in past 48 hours  - Appreciate nephrology team following, continue sodium bicarb  - continue renal meds: calcitrol, vitamin D - follow up BMP daily.  Hypernatremia - Increase in sodium since admission, reached highest of 152 but has normalized over past 48 hours. Non-obstructive nephrolithiasis: bilateral - Seen on CT scan. Do not suspect this is causing AKI Hypomagnesemia - supplemented and within normal limits Hypokalemia - in the setting of acute on chronic renal failure. - potassium repleted with K-Dur 20 MEQ TID PO . Follow up BMP daily.  Anemia of chronic kidney disease - Drop in hemoglobin since admission, possible dilutional effect from IV fluids. Hemoglobin 8.0 this morning.  - no signs of active bleeding and no current indications for transfusion. - Repeat CBC in the morning Chron's disease with no evidence of acute exacerbation  on CT (11/4).  - Appears to be clinically stable at this time - continue home regimen with steroids - advance diet as pt able to tolerate  Hypothyroidism - continue Synthroid COPD exacerbation - Clinically stable this morning, patient maintaining oxygen saturation at target range - Continue Flovent BID, spiriva daily  - albuterol QID PRN  Secondary hyperparathyroidism  - continue calcitriol Hypothyroidism - continue synthroid  Chronic diastolic CHF - last 2-D echo February 2015 with EF 50% and stage II diastolic CHF - at home on Lasix 40 mg daily but held while inpatient due to hypovolemia - weight trend since admission: 107 lbs --> 108 lbs --> 114 --> 117 lbs this AM. Will see with renal if we can restart lasix.  - Will continue to monitor daily weights, strict intake and output HTN - reasonable inpatient control  Hypocalcemia - calcium gluconate 4 gm provided. Corrected calcium 7.8. - continue Calcitrol  Thrombocytopenia - platelet count dropping from 171 to 141 to 123; on heparin subQ. Will hold sub Q heparin and use SCD's for now for DVT prophylaxis. Severe protein calorie malnutrition - Advance diet as patient able to tolerate  DVT prophylaxis  Heparin SQ to be stopped today and use SCD's for DVT prophylaxis.  Code Status: Full Family Communication: family not at the bedside this morning  Disposition Plan: Remains inpatient    IV access:   Peripheral IV  Procedures and diagnostic studies:     Ct Abdomen Pelvis Wo Contrast 03/29/2014 Status post right hemicolectomy. No gross inflammatory changes associated with the small bowel or colon on today's noncontrast CT examination to strongly suggest presence of an acute Crohn's exacerbation. 2. The pancreas is normal in appearance on  today's noncontrast examination. 3. Numerous nonobstructive calculi within the collecting systems of the kidneys bilaterally, largest of which measures up to 6 mm in the upper pole  collecting system of the right kidney. No ureteral stones or findings of urinary tract obstruction are noted at this time.   Dg Chest 2 View 03/29/2014 Underlying emphysematous change. No edema or consolidation.   Medical Consultants:   Nephrology  Other Consultants:   Physical therapy  IAnti-Infectives:    None    Leisa Lenz, MD  [facility] Pager [phone]  If 7PM-7AM, please contact night-coverage [email or web address] Password [email or web address] 04/02/2014, 11:49 AM   LOS: 4 days    HPI/Subjective: No acute overnight events.  Objective: Filed Vitals:   04/01/14 2016 04/01/14 2359 04/02/14 0422 04/02/14 0745  BP:  108/70 111/65 135/78  Pulse:  90 80 114  Temp:  98.6 F (37 C) 98 F (36.7 C) 98.8 F (37.1 C)  TempSrc:  Oral Axillary Oral  Resp:  11 12 19   Height:      Weight:   53.4 kg (117 lb 11.6 oz)   SpO2: 100% 96% 97% 92%    Intake/Output Summary (Last 24 hours) at 04/02/14 1149 Last data filed at 04/02/14 0700  Gross per 24 hour  Intake   6065 ml  Output    325 ml  Net   5740 ml    Exam:   General:  Pt is not in acute distress  Cardiovascular: Regular rate and rhythm, S1/S2 appreciated   Respiratory: no wheezing, no crackles, no rhonchi  Abdomen: Soft, non tender, non distended, bowel sounds present  Extremities: pulses palpable   Neuro: Grossly nonfocal  Data Reviewed: Basic Metabolic Panel:  Recent Labs Lab 03/30/14 1630 03/30/14 2308 03/31/14 0313 04/01/14 0335 04/02/14 0330  NA 144 147 152* 146 143  K 3.3* 2.9* 3.1* 2.9* 2.6*  CL 120* 119* 122* 113* 105  CO2 <7* 10* 11* 14* 24  GLUCOSE 105* 97 76 88 96  BUN 57* 56* 56* 52* 39*  CREATININE 5.76* 5.76* 5.78* 5.53* 4.62*  CALCIUM 6.1* 7.9* 7.9* 6.6* 6.4*  MG 1.3* 3.3* 3.0* 2.1 1.6  PHOS 3.9  --  3.2  --  2.7   Liver Function Tests:  Recent Labs Lab 03/30/14 0735 03/30/14 1630 03/31/14 0313 04/01/14 0335 04/02/14 0330  AST 14 17 14 14 13   ALT 9 9 8 7 7   ALKPHOS 171*  177* 158* 123* 110  BILITOT 0.3 0.4 0.6 0.4 0.4  PROT 6.2 6.4 5.8* 5.0* 4.8*  ALBUMIN 3.0* 3.0*  3.0* 2.7* 2.4* 2.2*    Recent Labs Lab 03/30/14 1630  LIPASE 208*   No results for input(s): AMMONIA in the last 168 hours. CBC:  Recent Labs Lab 03/29/14 1941 03/29/14 1954 03/30/14 0735 03/31/14 0313 04/01/14 0335 04/02/14 0330  WBC 9.6  --  10.5 6.6 5.6 5.7  NEUTROABS 5.8  --  7.6 4.6 3.3 3.2  HGB 11.9* 13.3 11.1* 10.6* 8.6* 8.0*  HCT 36.4 39.0 34.4* 31.7* 26.3* 24.1*  MCV 95.3  --  96.1 92.7 94.9 93.4  PLT 181  --  152 171 141* 123*   Cardiac Enzymes: No results for input(s): CKTOTAL, CKMB, CKMBINDEX, TROPONINI in the last 168 hours. BNP: Invalid input(s): POCBNP CBG:  Recent Labs Lab 03/31/14 2002 04/01/14 0014  GLUCAP 106* 107*    Recent Results (from the past 240 hour(s))  MRSA PCR Screening     Status: None   Collection Time: 03/30/14  3:20  PM  Result Value Ref Range Status   MRSA by PCR NEGATIVE NEGATIVE Final    Comment:        Culture, Urine     Status: None   Collection Time: 03/30/14  7:04 PM  Result Value Ref Range Status   Specimen Description URINE, RANDOM  Final   Special Requests NONE  Final   Culture  Setup Time   Final   Colony Count   Final   Culture   Final    Multiple bacterial morphotypes present, none predominant. Suggest appropriate recollection if clinically indicated. Performed at [facility]    Report Status 04/01/2014 FINAL  Final     Scheduled Meds: . calcitRIOL  0.5 mcg Oral Daily  . feeding supplement (RESOURCE BREEZE)  1 Container Oral TID BM  . fluticasone  2 puff Inhalation BID  . heparin  5,000 Units Subcutaneous 3 times per day  . levothyroxine  125 mcg Oral QAC breakfast  . potassium chloride  20 mEq Oral TID  . predniSONE  5 mg Oral Q breakfast  . Vitamin D (Ergocalciferol)  50,000 Units Oral Once per day on Sun Wed   Continuous Infusions: .  sodium bicarbonate  infusion 1000 mL 150 mL/hr at 04/02/14  1022

## 2014-04-02 NOTE — Progress Notes (Signed)
PT Cancellation Note  Patient Details Name: TALLULA GRINDLE MRN: [id number] DOB: 12-24-1945   Cancelled Treatment:    Reason Eval/Treat Not Completed: Medical issues which prohibited therapy - noting pt with persistent hypokalemia (3.1>>2.9>>2.6), will defer mobility eval this date in order to minimize risk for muscular/cardiac contraction dysfunction.  Will reassess next date and pursue mobility eval with recommendations as pt medically appropriate.  Thank you,    Herbie Drape 04/02/2014, 11:46 AM

## 2014-04-03 LAB — CBC WITH DIFFERENTIAL/PLATELET
BASOS PCT: 0 % (ref 0–1)
Basophils Absolute: 0 10*3/uL (ref 0.0–0.1)
EOS ABS: 0.1 10*3/uL (ref 0.0–0.7)
EOS PCT: 1 % (ref 0–5)
HEMATOCRIT: 26.6 % — AB (ref 36.0–46.0)
HEMOGLOBIN: 8.6 g/dL — AB (ref 12.0–15.0)
LYMPHS ABS: 1.8 10*3/uL (ref 0.7–4.0)
Lymphocytes Relative: 27 % (ref 12–46)
MCH: 30.6 pg (ref 26.0–34.0)
MCHC: 32.3 g/dL (ref 30.0–36.0)
MCV: 94.7 fL (ref 78.0–100.0)
MONO ABS: 0.9 10*3/uL (ref 0.1–1.0)
MONOS PCT: 13 % — AB (ref 3–12)
Neutro Abs: 4 10*3/uL (ref 1.7–7.7)
Neutrophils Relative %: 59 % (ref 43–77)
Platelets: 122 10*3/uL — ABNORMAL LOW (ref 150–400)
RBC: 2.81 MIL/uL — AB (ref 3.87–5.11)
RDW: 15.1 % (ref 11.5–15.5)
WBC: 6.8 10*3/uL (ref 4.0–10.5)

## 2014-04-03 LAB — IRON AND TIBC
Iron: 71 ug/dL (ref 42–135)
Saturation Ratios: 37 % (ref 20–55)
TIBC: 190 ug/dL — ABNORMAL LOW (ref 250–470)
UIBC: 119 ug/dL — ABNORMAL LOW (ref 125–400)

## 2014-04-03 LAB — RENAL FUNCTION PANEL
Albumin: 2.2 g/dL — ABNORMAL LOW (ref 3.5–5.2)
Anion gap: 13 (ref 5–15)
BUN: 36 mg/dL — AB (ref 6–23)
CO2: 27 mEq/L (ref 19–32)
CREATININE: 4.16 mg/dL — AB (ref 0.50–1.10)
Calcium: 6.9 mg/dL — ABNORMAL LOW (ref 8.4–10.5)
Chloride: 104 mEq/L (ref 96–112)
GFR calc Af Amer: 12 mL/min — ABNORMAL LOW (ref >90)
GFR calc non Af Amer: 10 mL/min — ABNORMAL LOW (ref >90)
Glucose, Bld: 91 mg/dL (ref 70–99)
PHOSPHORUS: 2.5 mg/dL (ref 2.3–4.6)
Potassium: 2.8 mEq/L — CL (ref 3.7–5.3)
Sodium: 144 mEq/L (ref 137–147)

## 2014-04-03 LAB — VITAMIN D 1,25 DIHYDROXY
Vitamin D 1, 25 (OH)2 Total: 11 pg/mL — ABNORMAL LOW (ref 18–72)
Vitamin D2 1, 25 (OH)2: <8 pg/mL
Vitamin D3 1, 25 (OH)2: 11 pg/mL

## 2014-04-03 LAB — FERRITIN: Ferritin: 186 ng/mL (ref 10–291)

## 2014-04-03 LAB — MAGNESIUM: Magnesium: 1.3 mg/dL — ABNORMAL LOW (ref 1.5–2.5)

## 2014-04-03 MED ORDER — POTASSIUM CHLORIDE CRYS ER 20 MEQ PO TBCR
40.0000 meq | EXTENDED_RELEASE_TABLET | Freq: Three times a day (TID) | ORAL | Status: DC
  Administered 2014-04-03 (×2): 40 meq via ORAL
  Filled 2014-04-03 (×4): qty 2

## 2014-04-03 MED ORDER — MAGNESIUM SULFATE 4 GM/100ML IV SOLN
4.0000 g | Freq: Once | INTRAVENOUS | Status: AC
  Administered 2014-04-03: 4 g via INTRAVENOUS
  Filled 2014-04-03: qty 100

## 2014-04-03 MED ORDER — HEPARIN SODIUM (PORCINE) 5000 UNIT/ML IJ SOLN
5000.0000 [IU] | Freq: Three times a day (TID) | INTRAMUSCULAR | Status: DC
  Administered 2014-04-03 – 2014-04-06 (×9): 5000 [IU] via SUBCUTANEOUS
  Filled 2014-04-03 (×11): qty 1

## 2014-04-03 MED ORDER — FOLIC ACID 1 MG PO TABS
1.0000 mg | ORAL_TABLET | Freq: Every day | ORAL | Status: DC
  Administered 2014-04-03 – 2014-04-07 (×5): 1 mg via ORAL
  Filled 2014-04-03 (×5): qty 1

## 2014-04-03 MED ORDER — SODIUM BICARBONATE 650 MG PO TABS
650.0000 mg | ORAL_TABLET | Freq: Every day | ORAL | Status: DC
  Administered 2014-04-03 – 2014-04-05 (×3): 650 mg via ORAL
  Filled 2014-04-03 (×3): qty 1

## 2014-04-03 MED ORDER — POTASSIUM CHLORIDE 10 MEQ/100ML IV SOLN
10.0000 meq | INTRAVENOUS | Status: AC
  Administered 2014-04-03 (×4): 10 meq via INTRAVENOUS
  Filled 2014-04-03 (×4): qty 100

## 2014-04-03 NOTE — Progress Notes (Addendum)
CRITICAL VALUE ALERT  Critical value received: potassium 2.8  Date of notification:  04/03/14  Time of notification:  0515  Critical value read back:Yes.    Nurse who received alert:  [name] Madura RN  MD notified (1st page):  Fredirick Maudlin NP  Time of first page:  0520  MD notified (2nd page):  Time of second page:  Responding MD:  Fredirick Maudlin NP  Time MD responded:  [phone]

## 2014-04-03 NOTE — Progress Notes (Signed)
[place] TEAM 1 - Stepdown/ICU TEAM Progress Note  Natasha [name] ELF:[id number] DOB: 10-16-1945 DOA: 03/29/2014 PCP: Cloyd Stagers, MD  Admit HPI / Brief Narrative: 68 y.o. F w/ Hx stage 4 CKD, Crohns disease s/p colectomy, COPD, and HTN who presented with abd pain, AKI, anion gap metabolic acidosis, hypokalemia, and nephrolithiasis. Pt reported abd pain/back pain over 1-2 weeks.  Tried to take in oral fluids, but felt she was unsuccessful. Saw her PCP because of sxs. PCP ordered CT abd pel w/o contrast and bloodwork. CT showed no acute findings concerning for colitis or pancreatitis. Did show multiple bilateral non obstructing kidney stones. BMET showed Cr 5.94 (baseline around 2.2), Bicarb 8, AG 21, BUN 60, K 2.9, Mg 1.0.  HPI/Subjective: Denies cp, sob, or current abdom pain.  States she is tolerating her diet thus far and has a semi-formed bowel movement this morning.    Assessment/Plan:  Acute on chronic renal failure with anion gap metabolic acidosis / ATN  - etiology felt to be hypovolemia - creatinine slowly improving   - Nephrology following  - gap has closed / acidosis resolved - transitioned to oral bicarb  - continue renal meds: calcitrol, vitamin D  Hypernatremia - has normalized w/ hydration - follow trend   Non-obstructive nephrolithiasis: bilateral - Seen on CT scan - currently asymptomatic   Refractory Hypomagnesemia - supplemented to normal, but has dropped again - oral tx will predispose to diarrhea, which could perpetuate cycle of DH and AKF - replete again today IV and follow   Hypocalcemia  - cont to replace   Vitamin D deficiency  - cont to replace  Refractory Hypokalemia - in the setting of acute on chronic renal failure - remains severely deficient - cont to replace and follow   Anemia of chronic kidney disease - Drop in hemoglobin since admission dilutional effect from IV fluids - no signs of active bleeding and no current  indications for transfusion - Hgb appear to have stabilized around 8  Crohn's disease with no evidence of acute exacerbation on CT - Appears to be clinically stable at this time - continue home regimen with steroids - advance diet as pt able to tolerate   COPD exacerbation - exacerbation now resolved - follow   Secondary hyperparathyroidism  - continue calcitriol  Hypothyroidism - continue synthroid   Chronic diastolic CHF - last 2-D echo February 2015 with EF 50% and grade 2 diastolic CHF - at home on Lasix 40 mg daily but held while inpatient due to hypovolemia - weight trend since admission: 107 lbs --> 108 lbs --> 114 --> 117 lbs  - will continue to monitor daily weights, strict intake and output - no evidence of volume overload or distress on exam   HTN - reasonable inpatient control   Thrombocytopenia - platelet count has stabilized around 120 - follow trend   Severe protein calorie malnutrition - Advance diet as patient able to tolerate  Code Status: FULL Family Communication: no family present at time of exam Disposition Plan: SDU   Consultants: Nephrology   Procedure/Significant Events: 11/4 CT abdomen pelvis without contrast; space S/P right hemicolectomy. -No gross inflammatory changes associated with the small bowel or colon to suggest presence of an acute Crohn'sexacerbation.- The pancreas is normal in appearance -Numerous nonobstructive calculi within the collecting systems of kidneys bilaterally, largest of which measures up to 6 mm in upper pole collecting system of the right kidney. -No ureteral stones or findings of urinary tract obstruction  Antibiotics: NA  DVT prophylaxis: Subcutaneous heparin  Objective: Blood pressure 122/83, pulse 88, temperature 99.2 F (37.3 C), temperature source Oral, resp. rate 13, height 5' 4"  (1.626 m), weight 52.2 kg (115 lb 1.3 oz), SpO2 98 %.  Intake/Output Summary (Last 24 hours) at 04/03/14 1455 Last data  filed at 04/03/14 1440  Gross per 24 hour  Intake   1240 ml  Output   2400 ml  Net  -1160 ml   Exam: General: No acute respiratory distress Lungs: Clear to auscultation bilaterally without wheezes or crackles Cardiovascular: Regular rate and rhythm without murmur gallop or rub normal S1 and S2 Abdomen: nontender to palpation today, nondistended, soft, bowel sounds positive Extremities: No significant cyanosis, clubbing, or edema bilateral lower extremities  Data Reviewed: Basic Metabolic Panel:  Recent Labs Lab 03/30/14 1630 03/30/14 2308 03/31/14 0313 04/01/14 0335 04/02/14 0330 04/03/14 0331  NA 144 147 152* 146 143 144  K 3.3* 2.9* 3.1* 2.9* 2.6* 2.8*  CL 120* 119* 122* 113* 105 104  CO2 <7* 10* 11* 14* 24 27  GLUCOSE 105* 97 76 88 96 91  BUN 57* 56* 56* 52* 39* 36*  CREATININE 5.76* 5.76* 5.78* 5.53* 4.62* 4.16*  CALCIUM 6.1* 7.9* 7.9* 6.6* 6.4* 6.9*  MG 1.3* 3.3* 3.0* 2.1 1.6 1.3*  PHOS 3.9  --  3.2  --  2.7 2.5   Liver Function Tests:  Recent Labs Lab 03/30/14 0735 03/30/14 1630 03/31/14 0313 04/01/14 0335 04/02/14 0330 04/03/14 0331  AST 14 17 14 14 13   --   ALT 9 9 8 7 7   --   ALKPHOS 171* 177* 158* 123* 110  --   BILITOT 0.3 0.4 0.6 0.4 0.4  --   PROT 6.2 6.4 5.8* 5.0* 4.8*  --   ALBUMIN 3.0* 3.0*  3.0* 2.7* 2.4* 2.2* 2.2*    Recent Labs Lab 03/30/14 1630  LIPASE 208*   CBC:  Recent Labs Lab 03/30/14 0735 03/31/14 0313 04/01/14 0335 04/02/14 0330 04/03/14 0331  WBC 10.5 6.6 5.6 5.7 6.8  NEUTROABS 7.6 4.6 3.3 3.2 4.0  HGB 11.1* 10.6* 8.6* 8.0* 8.6*  HCT 34.4* 31.7* 26.3* 24.1* 26.6*  MCV 96.1 92.7 94.9 93.4 94.7  PLT 152 171 141* 123* 122*   CBG:  Recent Labs Lab 03/31/14 2002 04/01/14 0014  GLUCAP 106* 107*    Recent Results (from the past 240 hour(s))  MRSA PCR Screening     Status: None   Collection Time: 03/30/14  3:20 PM  Result Value Ref Range Status   MRSA by PCR NEGATIVE NEGATIVE Final    Comment:        The  GeneXpert MRSA Assay (FDA approved for NASAL specimens only), is one component of a comprehensive MRSA colonization surveillance program. It is not intended to diagnose MRSA infection nor to guide or monitor treatment for MRSA infections.   Culture, Urine     Status: None   Collection Time: 03/30/14  7:04 PM  Result Value Ref Range Status   Specimen Description URINE, RANDOM  Final   Special Requests NONE  Final   Culture  Setup Time   Final    03/31/2014 02:30 Performed at [facility]   Final    50,000 COLONIES/ML Performed at [facility]    Culture   Final    Multiple bacterial morphotypes present, none predominant. Suggest appropriate recollection if clinically indicated. Performed at [facility]    Report Status  04/01/2014 FINAL  Final     Studies:  Recent x-ray studies have been reviewed in detail by the Attending Physician  Scheduled Meds:  Scheduled Meds: . calcitRIOL  0.5 mcg Oral Daily  . feeding supplement (RESOURCE BREEZE)  1 Container Oral TID BM  . fluticasone  2 puff Inhalation BID  . levothyroxine  125 mcg Oral QAC breakfast  . potassium chloride  40 mEq Oral TID  . predniSONE  5 mg Oral Q breakfast  . sodium bicarbonate  650 mg Oral Daily  . Vitamin D (Ergocalciferol)  50,000 Units Oral Once per day on Sun Wed    Time spent on care of this patient: 40 mins  Cherene Altes, MD [facility] For Consults/Admissions - Flow Manager - [phone] Office  [phone] Pager [phone]  On-Call/Text Page:      [email or web address]      password [facility]  04/03/2014, 2:55 PM   LOS: 5 days

## 2014-04-03 NOTE — Evaluation (Signed)
Physical Therapy Evaluation Patient Details Name: Natasha [name] MRN: [id number] DOB: 1946-01-30 Today's Date: 04/03/2014   History of Present Illness  Natasha [name] is a 68 y.o. year old female presenting with abd pain, AKI, AGMA, hypokalemia, hypomagnesemia, nephrolithiasis    Clinical Impression  Patient demonstrates deficits in functional mobility as indicated below. Will need continued skilled PT to address deficits and maximize function. Will see as indicated and progress as tolerated. Will ambulate with RW next session.    Follow Up Recommendations Home health PT;Supervision/Assistance - 24 hour    Equipment Recommendations  Rolling walker with 5" wheels    Recommendations for Other Services       Precautions / Restrictions Precautions Precautions: Fall Restrictions Weight Bearing Restrictions: No      Mobility  Bed Mobility Overal bed mobility: Needs Assistance Bed Mobility: Supine to Sit;Sit to Supine     Supine to sit: Min assist Sit to supine: Min assist      Transfers Overall transfer level: Needs assistance Equipment used: 1 person hand held assist Transfers: Sit to/from [facility] Sit to Stand: Mod assist Stand pivot transfers: Mod assist       General transfer comment: Mod assist for stability, Cues for elevation to upright, may benefit from use of RW  Ambulation/Gait Ambulation/Gait assistance: Mod assist Ambulation Distance (Feet): 6 Feet Assistive device: 1 person hand held assist       General Gait Details: pivotal steps to [facility] and back to bed  Stairs            Wheelchair Mobility    Modified Rankin (Stroke Patients Only)       Balance Overall balance assessment: Needs assistance Sitting-balance support: Feet supported Sitting balance-Leahy Scale: Fair     Standing balance support: During functional activity Standing balance-Leahy Scale: Poor                                Pertinent Vitals/Pain Pain Assessment: 0-10 Pain Score: 5  Pain Location: left jaw and shoulder Pain Descriptors / Indicators: Constant;Aching;Tender Pain Intervention(s): Monitored during session;Repositioned    Home Living Family/patient expects to be discharged to:: Private residence Living Arrangements: Spouse/significant other;Non-relatives/Friends Available Help at Discharge: Available 24 hours/day Type of Home: House Home Access: Level entry     Home Layout: One level Home Equipment: None      Prior Function Level of Independence: Independent               Hand Dominance   Dominant Hand: Right    Extremity/Trunk Assessment   Upper Extremity Assessment: Generalized weakness           Lower Extremity Assessment: Generalized weakness (LLE slightly weaker than RLE)         Communication   Communication: No difficulties  Cognition Arousal/Alertness: Awake/alert Behavior During Therapy: WFL for tasks assessed/performed Overall Cognitive Status: Within Functional Limits for tasks assessed                      General Comments      Exercises        Assessment/Plan    PT Assessment Patient needs continued PT services  PT Diagnosis Difficulty walking;Abnormality of gait;Generalized weakness;Acute pain   PT Problem List Decreased strength;Decreased range of motion;Decreased activity tolerance;Decreased balance;Decreased mobility;Decreased knowledge of use of DME;Decreased safety awareness  PT Treatment Interventions DME instruction;Gait training;Stair training;Functional mobility training;Therapeutic activities;Therapeutic exercise;Balance  training;Patient/family education   PT Goals (Current goals can be found in the Care Plan section) Acute Rehab PT Goals Patient Stated Goal: to go home with husband PT Goal Formulation: With patient Time For Goal Achievement: 04/17/14 Potential to Achieve Goals: Good    Frequency Min 3X/week    Barriers to discharge        Co-evaluation               End of Session Equipment Utilized During Treatment: Gait belt Activity Tolerance: Patient tolerated treatment well;Patient limited by fatigue Patient left: in bed;with call bell/phone within reach Nurse Communication: Mobility status         Time: 2440-1027 PT Time Calculation (min): 20 min   Charges:   PT Evaluation $Initial PT Evaluation Tier I: 1 Procedure PT Treatments $Therapeutic Activity: 8-22 mins   PT G CodesDuncan Dull 04/03/2014, 11:16 AM Alben Deeds, PT DPT  [phone]

## 2014-04-03 NOTE — Progress Notes (Signed)
Patient ID: Natasha [name], female    DOB: March 23, 1946, 68 y.o.   MRN: [id number] Nephrology Progress Note  S: No complaints overnight. Patient has had good urine output and is having bowel movements. She states they are getting a little more formed. She tolerated her regular diet well yesterday. This morning she states her jaw hurts. Looks great  O:BP 122/83 mmHg  Pulse 88  Temp(Src) 98.9 F (37.2 C) (Axillary)  Resp 13  Ht 5' 4"  (1.626 m)  Wt 115 lb 1.3 oz (52.2 kg)  BMI 19.74 kg/m2  SpO2 96%  Intake/Output Summary (Last 24 hours) at 04/03/14 0700 Last data filed at 04/03/14 0600  Gross per 24 hour  Intake   1000 ml  Output   1700 ml  Net   -700 ml   Intake/Output: I/O last 3 completed shifts: In: 1165 [P.O.:240; I.V.:5900] Out: 1025 [Urine:1025]  Intake/Output this shift:  Total I/O In: 925 [I.V.:825; IV Piggyback:100] Out: 1200 [Urine:1200] Weight change: -2 lb 10.3 oz (-1.2 kg) Gen: Sitting in bed, in no distress, rubbing jaw HEENT: TMJ tenderness on left BXU:XYBFXOV rate and rhythm without murmur Resp:Clear to auscultation bilaterally without rales or wheezing ANV:BTYO, non-tender, +bowel sounds MAY:OKHTX edema bilaterally Skin: dry   Recent Labs Lab 03/30/14 0735 03/30/14 1630 03/30/14 2308 03/31/14 0313 04/01/14 0335 04/02/14 0330 04/03/14 0331  NA 147 144 147 152* 146 143 144  K 3.3* 3.3* 2.9* 3.1* 2.9* 2.6* 2.8*  CL 122* 120* 119* 122* 113* 105 104  CO2 7* <7* 10* 11* 14* 24 27  GLUCOSE 97 105* 97 76 88 96 91  BUN 57* 57* 56* 56* 52* 39* 36*  CREATININE 5.83* 5.76* 5.76* 5.78* 5.53* 4.62* 4.16*  ALBUMIN 3.0* 3.0*  3.0*  --  2.7* 2.4* 2.2* 2.2*  CALCIUM 6.1* 6.1* 7.9* 7.9* 6.6* 6.4* 6.9*  PHOS  --  3.9  --  3.2  --  2.7 2.5  AST 14 17  --  14 14 13   --   ALT 9 9  --  8 7 7   --    :  Recent Labs Lab 03/31/14 0313 04/01/14 0335 04/02/14 0330 04/03/14 0331  AST 14 14 13   --   ALT 8 7 7   --   ALKPHOS 158* 123* 110  --   BILITOT 0.6 0.4  0.4  --   PROT 5.8* 5.0* 4.8*  --   ALBUMIN 2.7* 2.4* 2.2* 2.2*   Results for ARKIE, TAGLIAFERRO (MRN [id number]) as of 04/01/2014 10:39  Ref. Range 03/29/2014 23:04  Vit D, 25-Hydroxy Latest Range: 30-89 ng/mL 20 (L)    Recent Labs Lab 03/30/14 1630  LIPASE 208*    Recent Labs Lab 03/30/14 0735 03/31/14 0313 04/01/14 0335 04/02/14 0330 04/03/14 0331  WBC 10.5 6.6 5.6 5.7 6.8  NEUTROABS 7.6 4.6 3.3 3.2 4.0  HGB 11.1* 10.6* 8.6* 8.0* 8.6*  HCT 34.4* 31.7* 26.3* 24.1* 26.6*  MCV 96.1 92.7 94.9 93.4 94.7  PLT 152 171 141* 123* 122*    Lab Results  Component Value Date   IRON 71 04/01/2014   TIBC 190* 04/01/2014   IRONPCTSAT 37 04/01/2014   UIBC 119* 04/01/2014   FERRITIN 186 04/01/2014    Medications . calcitRIOL  0.5 mcg Oral Daily  . feeding supplement (RESOURCE BREEZE)  1 Container Oral TID BM  . fluticasone  2 puff Inhalation BID  . levothyroxine  125 mcg Oral QAC breakfast  . potassium chloride  10 mEq Intravenous Q1 Hr  x 4  . potassium chloride  20 mEq Oral TID  . predniSONE  5 mg Oral Q breakfast  . Vitamin D (Ergocalciferol)  50,000 Units Oral Once per day on Sun Wed   .  sodium bicarbonate  infusion 1000 mL 75 mL/hr at 04/02/14 1737   Background: 68yo female with a history of CKD4, COPD, and Crohn's disease admitted for AKI with anion gap acidosis, profound hypokalemia, hypomagnesemia after several days of diarrhea, followed by a week or more of inability to eat, drink or take her bicarb/K supplements as an outpt.  Has baseline CKD 4 (creatinine 2.93 when she last saw Dr. Mercy Moore 8/15).   Suspect possible AKI/ATN due to hypovolemia when looking at overall clinical picture. Started on isotonic bicarbonate for acidosis on 11/5, and K/Mg supplementation and switched to hypotonic (58mq) sodium bicarbonate to allow for more free water due to hypernatremia. Found to be 25-vitamin D deficient in addition to having hypocalcemia. Creatinine improving with persistent  hypokalemia. Non-oliguric.   Assessment/Plan:  1. AKI with anion gap metabolic acidosis: Anion gap has closed and metabolic acidosis has resolved. AKI most likely a result of hypovolemia with patient's initial tachycardia with high sodium and clear chest x-ray in addition to presenting history. Renal function continues to improve slowly with IVF and oral intake. Creatinine continues to decrease (4.16 on 11/9). Patient is not fluid overloaded and can still tolerate IV hydration. Is taking PO's so will transition away from IV bicarb and told her she needs to take bicarb as OP- she prefers pills, will try one daily for now 2. CKD4: Followed by Dr. MMer[facility]outpatient. Last seen in 12/2013 with a creatinine of 2.93. . 3. Hypernatremia: Resolved. Back on PO's 4. Non-obstructive nephrolithiasis: bilateral. Seen on CT scan. Do not suspect this is causing AKI. 5. Hypocalcemia: Corrected to 7.9 today. S/p calcium gluconate 4g on 11/5 and 1g on 11/7. Vit D def. (see below) 6. Hypomagnesemia: 1.0 on admission. s/p magnesium sulfate 2g IV. Peaked to 3.3. Fell to 1.3- give another IV repletion today- pills as OP problematic because they will interfere with bowels 7. Hypokalemia: Takes K 295m BID at home. Potassium of 2.9 on admission. Is lower, needs more, increase to 40 TID 8. Chron's disease with no evidence of acute exacerbation on CT (11/4). Patient actually more constipated than usual (having about 0-1 BMs per day down from around 4 per day). Management per primary. Does note that she had severe diarrhea  couple weeks back then poor po since due to n/v 9. Hypothyroidism: on synthroid. Management per primary team 10. 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 Anemia - Hb has steadily dropped with hydration. Continues to drop to 8 today (11/8). Iron panel suggest anemia of chronic inflammation/CKD. FOBT ordered but not  yet obtained. Iron is OK- needs aranesp- will give dose here  RaCordelia PocheMD PGY-2, [facility] 04/03/2014, 7:00 AM   Patient seen and examined, agree with above note with above modifications. Looks well.  Creatinine coming down and bicarb/sodium within normal limits. Will try to change over to PO fluids and d/c IV- give bicarb pill once daily- is still hypokalemic and hypomagnesemic- replete with one dose mag IV and increase her potassium pills.  Hopefully not that far from discharge  KeCorliss ParishMD 04/03/2014

## 2014-04-04 ENCOUNTER — Other Ambulatory Visit: Payer: Self-pay | Admitting: Gastroenterology

## 2014-04-04 ENCOUNTER — Inpatient Hospital Stay (HOSPITAL_COMMUNITY): Payer: Medicare Other

## 2014-04-04 DIAGNOSIS — I5032 Chronic diastolic (congestive) heart failure: Secondary | ICD-10-CM | POA: Insufficient documentation

## 2014-04-04 DIAGNOSIS — N179 Acute kidney failure, unspecified: Secondary | ICD-10-CM | POA: Insufficient documentation

## 2014-04-04 DIAGNOSIS — E872 Acidosis: Secondary | ICD-10-CM | POA: Insufficient documentation

## 2014-04-04 DIAGNOSIS — N189 Chronic kidney disease, unspecified: Secondary | ICD-10-CM | POA: Insufficient documentation

## 2014-04-04 DIAGNOSIS — D696 Thrombocytopenia, unspecified: Secondary | ICD-10-CM

## 2014-04-04 DIAGNOSIS — E038 Other specified hypothyroidism: Secondary | ICD-10-CM | POA: Insufficient documentation

## 2014-04-04 DIAGNOSIS — I5042 Chronic combined systolic (congestive) and diastolic (congestive) heart failure: Secondary | ICD-10-CM

## 2014-04-04 DIAGNOSIS — I10 Essential (primary) hypertension: Secondary | ICD-10-CM

## 2014-04-04 DIAGNOSIS — E8729 Other acidosis: Secondary | ICD-10-CM | POA: Insufficient documentation

## 2014-04-04 DIAGNOSIS — N2581 Secondary hyperparathyroidism of renal origin: Secondary | ICD-10-CM | POA: Insufficient documentation

## 2014-04-04 DIAGNOSIS — E43 Unspecified severe protein-calorie malnutrition: Secondary | ICD-10-CM | POA: Insufficient documentation

## 2014-04-04 DIAGNOSIS — R1084 Generalized abdominal pain: Secondary | ICD-10-CM | POA: Insufficient documentation

## 2014-04-04 DIAGNOSIS — I119 Hypertensive heart disease without heart failure: Secondary | ICD-10-CM | POA: Insufficient documentation

## 2014-04-04 DIAGNOSIS — M25511 Pain in right shoulder: Secondary | ICD-10-CM | POA: Insufficient documentation

## 2014-04-04 LAB — COMPREHENSIVE METABOLIC PANEL
ALBUMIN: 2.2 g/dL — AB (ref 3.5–5.2)
ALT: 7 U/L (ref 0–35)
AST: 13 U/L (ref 0–37)
Alkaline Phosphatase: 97 U/L (ref 39–117)
Anion gap: 12 (ref 5–15)
BILIRUBIN TOTAL: 0.5 mg/dL (ref 0.3–1.2)
BUN: 33 mg/dL — ABNORMAL HIGH (ref 6–23)
CALCIUM: 7.5 mg/dL — AB (ref 8.4–10.5)
CHLORIDE: 105 meq/L (ref 96–112)
CO2: 25 mEq/L (ref 19–32)
Creatinine, Ser: 3.62 mg/dL — ABNORMAL HIGH (ref 0.50–1.10)
GFR calc Af Amer: 14 mL/min — ABNORMAL LOW (ref >90)
GFR calc non Af Amer: 12 mL/min — ABNORMAL LOW (ref >90)
Glucose, Bld: 83 mg/dL (ref 70–99)
Potassium: 4.6 mEq/L (ref 3.7–5.3)
Sodium: 142 mEq/L (ref 137–147)
Total Protein: 5.4 g/dL — ABNORMAL LOW (ref 6.0–8.3)

## 2014-04-04 LAB — CBC
HCT: 27.2 % — ABNORMAL LOW (ref 36.0–46.0)
Hemoglobin: 8.7 g/dL — ABNORMAL LOW (ref 12.0–15.0)
MCH: 31.2 pg (ref 26.0–34.0)
MCHC: 32 g/dL (ref 30.0–36.0)
MCV: 97.5 fL (ref 78.0–100.0)
PLATELETS: 124 10*3/uL — AB (ref 150–400)
RBC: 2.79 MIL/uL — AB (ref 3.87–5.11)
RDW: 14.9 % (ref 11.5–15.5)
WBC: 6.8 10*3/uL (ref 4.0–10.5)

## 2014-04-04 LAB — LIPASE, BLOOD: LIPASE: 42 U/L (ref 11–59)

## 2014-04-04 LAB — PHOSPHORUS: PHOSPHORUS: 2.7 mg/dL (ref 2.3–4.6)

## 2014-04-04 LAB — MAGNESIUM: Magnesium: 2.1 mg/dL (ref 1.5–2.5)

## 2014-04-04 MED ORDER — POTASSIUM CHLORIDE CRYS ER 20 MEQ PO TBCR
20.0000 meq | EXTENDED_RELEASE_TABLET | Freq: Three times a day (TID) | ORAL | Status: DC
  Administered 2014-04-04 – 2014-04-05 (×4): 20 meq via ORAL
  Filled 2014-04-04 (×5): qty 1

## 2014-04-04 MED ORDER — DARBEPOETIN ALFA 100 MCG/0.5ML IJ SOSY
100.0000 ug | PREFILLED_SYRINGE | INTRAMUSCULAR | Status: DC
  Filled 2014-04-04: qty 0.5

## 2014-04-04 NOTE — Progress Notes (Signed)
Physical Therapy Treatment Patient Details Name: Natasha [name] MRN: [id number] DOB: [birth year] Today's Date: 04/04/2014    History of Present Illness Natasha [name] is a 68 y.o. year old female presenting with abd pain, AKI, AGMA, hypokalemia, hypomagnesemia, nephrolithiasis      PT Comments    Patient progressing well with activity and mobility today. Ambulated in hall with RW, tolerated in room activity. Educated on techniques for edema control.  Follow Up Recommendations  Home health PT;Supervision/Assistance - 24 hour     Equipment Recommendations  Rolling walker with 5" wheels    Recommendations for Other Services       Precautions / Restrictions Precautions Precautions: Fall Restrictions Weight Bearing Restrictions: No    Mobility  Bed Mobility Overal bed mobility: Needs Assistance Bed Mobility: Supine to Sit;Sit to Supine     Supine to sit: Min assist Sit to supine: Min assist      Transfers Overall transfer level: Needs assistance Equipment used: Rolling walker (2 wheeled) Transfers: Sit to/from [facility] Sit to Stand: Min assist Stand pivot transfers: Min assist       General transfer comment: Improved stability, min assist for management of lines and stability during rtansfers  Ambulation/Gait Ambulation/Gait assistance: Min guard Ambulation Distance (Feet): 180 Feet Assistive device: Rolling walker (2 wheeled) Gait Pattern/deviations: Step-through pattern;Decreased stride length;Trunk flexed;Drifts right/left Gait velocity: decreased Gait velocity interpretation: Below normal speed for age/gender General Gait Details: VCs for upright posture and positioning with use of RW, min guard for stability, multiple standing rest breaks during ambulation   Stairs            Wheelchair Mobility    Modified Rankin (Stroke Patients Only)       Balance   Sitting-balance support: Feet supported Sitting balance-Leahy  Scale: Fair     Standing balance support: During functional activity Standing balance-Leahy Scale: Fair                      Cognition Arousal/Alertness: Awake/alert Behavior During Therapy: WFL for tasks assessed/performed Overall Cognitive Status: Within Functional Limits for tasks assessed                      Exercises      General Comments General comments (skin integrity, edema, etc.): educated on edema control and positionig, spoke with patient regarding massage techniques for feet QG:BEEFEOF. Educated patient on increased OOB and frequent mobility with staff.       Pertinent Vitals/Pain Pain Assessment: 0-10 Pain Score: 4     Home Living Family/patient expects to be discharged to:: Private residence Living Arrangements: Spouse/significant other;Non-relatives/Friends Available Help at Discharge: Available 24 hours/day Type of Home: House Home Access: Level entry   Home Layout: One level Home Equipment: None      Prior Function Level of Independence: Independent          PT Goals (current goals can now be found in the care plan section) Acute Rehab PT Goals Patient Stated Goal: to go home with husband PT Goal Formulation: With patient Time For Goal Achievement: 04/17/14 Potential to Achieve Goals: Good Progress towards PT goals: Progressing toward goals    Frequency  Min 3X/week    PT Plan Current plan remains appropriate    Co-evaluation             End of Session Equipment Utilized During Treatment: Gait belt Activity Tolerance: Patient tolerated treatment well;Patient limited by fatigue Patient left: in  chair;with call bell/phone within reach [facility] nursing aware)     Time: 9200-4159 PT Time Calculation (min) (ACUTE ONLY): 24 min  Charges:  $Gait Training: 8-22 mins $Self Care/Home Management: 8-22                    G CodesDuncan Dull 04-07-14, 5:30 PM Alben Deeds, [place] DPT  [phone]

## 2014-04-04 NOTE — Care Management Note (Signed)
    Page 1 of 1   04/04/2014     4:51:45 PM CARE MANAGEMENT NOTE 04/04/2014  Patient:  Natasha [name], Natasha [name]   Account Number:  [id number]  Date Initiated:  03/31/2014  Documentation initiated by:  Kerie Badger  Subjective/Objective Assessment:   dx metabolic acidosis 2/2 nephrolithiasis  lives with spouse    PCP  [facility] [place]  CM consult      PAC Choice  [place]   Choice offered to / List presented to:  C-1 Patient   DME arranged  Vassie Moselle      DME agency  [place] arranged  [place].   Status of service:  In process, will continue to follow Medicare Important Message given?  YES (If response is "NO", the following Medicare IM given date fields will be blank) Date Medicare IM given:  04/04/2014 Medicare IM given by:  Belen Zwahlen Date Additional Medicare IM given:   Additional Medicare IM given by:    Per UR Regulation:  Reviewed for med. necessity/level of care/duration of stay  If discussed at [place] of Stay Meetings, dates discussed:   04/04/2014   Comments:  04/04/14 [place] MSN BSN CCM PT recommends home therapy and rolling walker.  Provided list of agencies and pt chose [facility], stated she had therapy with them previously.  Provided copay cost for walker @ her request.

## 2014-04-04 NOTE — Progress Notes (Signed)
[employer] TEAM 1 - Stepdown/ICU TEAM Progress Note  Natasha [name] ZJI:[id number] DOB: 1945/10/02 DOA: 03/29/2014 PCP: Cloyd Stagers, MD  Admit HPI / Brief Narrative: 68 y.o. BF PMHx stage 4 CKD, chrons disease s/p colectomy, COPD, HTN presenting with abd pain, AKI, anion gap metabolic acidosis, hypokalemia, nephrolithiasis. Pt reports abd pain/back pain over the past 1-2 weeks. States that she saw her PCP about back pain 2 weeks ago. Had otherwise negative workup per pt. Was put on vicodin with improvement in sxs. Pt states that she then went on to developed generalized abd pain and nausea. Denies any diarrhea or dysuria. States that she felt that she was becoming progressively dehydrated over course of abd pain. Tried to take in oral fluids, but feels like she was unsuccessful. Followed by Dr Mercy Moore outpt for CKD. Has prn lasix she takes for edema. Denies taking this within last 1-2 weeks. Saw her PCP because of sxs. PCP ordered CT abd pel w/o contrast and bloodwork. CT showed no acute findings concerning for colitis of pancreatitis. Did show multiple bilateral non obstructing kidney stones. BMET showed Cr 5.94 (baselin around 2.2), Bicarb 8, AG 21, BUN 60, K 2.9, Mg 1.0 Presented to ER afebrile, HR 100s, resp 10s, BP 110s-130s, Satting 98% on RA. CXR shows underlying emphysema w/o acute abnormality. Nephrology formally consulted per ED PA Mable Fill).   HPI/Subjective: 11/10 states feels much better negative N/V. States positive right shoulder pain. States has never been on hemodialysis or home O2.  Assessment/Plan: Anion gap Metabolic acidosis-hyperchloremic -etiology felt to be hypovolemia - creatinine slowly improving  - Nephrology following  - gap has closed / acidosis resolved - transitioned to oral bicarb   Abd pain/nephrolithiasis  -most likely multifactorial to include hypocalcemia, hypomagnesemia, nephrolithiasis- stone burden,  -otherwise non acute CT   -resolved -Continue to aggressively replete electrolytes  Combined systolic and diastolic CHF -at home on Lasix 40 mg daily will hold -when patient's BP tolerates would start on beta blocker weight trend since admission: 107 lbs --> 108 lbs --> 114 --> 117 lbs  - will continue to monitor daily weights, strict intake and output - no evidence of volume overload or distress on exam   HTN -see combined systolic and diastolic CHF  Acute on chronic kidney failure  -baseline Cr ~ 2.1, on admission Cr = 7.3, 11/10 Cr = 3.62 -hold potential offending meds   hypokalemia/hypomagnesemia  -potassium goal>4 -magnesium goal> 2  -continue to monitor aggressively and replete as needed  Secondary hyperparathyroidism/Hypocalcemia -continue calcitriol 0.5 g daily  Hypothyroidism -Synthroid 125 g daily  Crohn's disease -Continue home dose of prednisone 5 mg daily  COPD/Emphysema  -Spiriva daily -Qvar 80 g 2 puffs daily(Flovent substituted for Qvar) -albuterol QID PRN   Right shoulder pain -X-ray right shoulder  Thrombocytopenia - platelet count has stabilized around 120 - follow trend   Severe protein calorie malnutrition - Advance diet as patient able to tolerate   Code Status: FULL Family Communication: family present at time of exam Disposition Plan: resolution of above problems    Consultants: Nephrology consult pending   Procedure/Significant Events: 2/16 echocardiogram;Left ventricle: mildly dilated.-LVEF= 55%. - Aortic valve: Moderate regurgitation. -significantly abnormal diastolicfunction 17/5 CT abdomen pelvis without contrast; space S/P right hemicolectomy.  -No gross inflammatory changes associated with the small bowel or colon to suggest presence of an acute Crohn'sexacerbation. - The pancreas is normal in appearance  -Numerous nonobstructive calculi within the collecting systems of kidneys bilaterally, largest of which measures  up to 6 mm in upper  pole collecting system of the right kidney.  -No ureteral stones or findings of urinary tract obstruction     Culture 11/5 MRSA by PCR negative  Antibiotics: NA  DVT prophylaxis: Subcutaneous heparin   Devices    LINES / TUBES:      Continuous Infusions:    Objective: VITAL SIGNS: Temp: 99 F (37.2 C) (11/10 0400) Temp Source: Oral (11/10 0400) BP: 87/54 mmHg (11/10 0400) Pulse Rate: 99 (11/10 0400) SPO2; FIO2:   Intake/Output Summary (Last 24 hours) at 04/04/14 1054 Last data filed at 04/04/14 0948  Gross per 24 hour  Intake    720 ml  Output    800 ml  Net    -80 ml     Exam: General: A/O x 4, sitting on the edge of the bed, only complaint she right shoulder pain,negative back/suprapubic pain.,No acute respiratory distress Lungs: Clear to auscultation bilaterally without wheezes or crackles Cardiovascular: Regular rate and rhythm without murmur gallop or rub normal S1 and S2 Abdomen: nontender, nondistended, soft, bowel sounds positive,  Extremities: No significant cyanosis, clubbing, or edema bilateral lower extremities. Right shoulder Pain to AbDuction, palpation AC joint  Data Reviewed: Basic Metabolic Panel:  Recent Labs Lab 03/30/14 1630  03/31/14 0313 04/01/14 0335 04/02/14 0330 04/03/14 0331 04/04/14 0547  NA 144  < > 152* 146 143 144 142  K 3.3*  < > 3.1* 2.9* 2.6* 2.8* 4.6  CL 120*  < > 122* 113* 105 104 105  CO2 <7*  < > 11* 14* 24 27 25   GLUCOSE 105*  < > 76 88 96 91 83  BUN 57*  < > 56* 52* 39* 36* 33*  CREATININE 5.76*  < > 5.78* 5.53* 4.62* 4.16* 3.62*  CALCIUM 6.1*  < > 7.9* 6.6* 6.4* 6.9* 7.5*  MG 1.3*  < > 3.0* 2.1 1.6 1.3* 2.1  PHOS 3.9  --  3.2  --  2.7 2.5 2.7  < > = values in this interval not displayed. Liver Function Tests:  Recent Labs Lab 03/30/14 1630 03/31/14 0313 04/01/14 0335 04/02/14 0330 04/03/14 0331 04/04/14 0547  AST 17 14 14 13   --  13  ALT 9 8 7 7   --  7  ALKPHOS 177* 158* 123* 110  --  97    BILITOT 0.4 0.6 0.4 0.4  --  0.5  PROT 6.4 5.8* 5.0* 4.8*  --  5.4*  ALBUMIN 3.0*  3.0* 2.7* 2.4* 2.2* 2.2* 2.2*    Recent Labs Lab 03/30/14 1630 04/04/14 0547  LIPASE 208* 42   No results for input(s): AMMONIA in the last 168 hours. CBC:  Recent Labs Lab 03/30/14 0735 03/31/14 0313 04/01/14 0335 04/02/14 0330 04/03/14 0331 04/04/14 0547  WBC 10.5 6.6 5.6 5.7 6.8 6.8  NEUTROABS 7.6 4.6 3.3 3.2 4.0  --   HGB 11.1* 10.6* 8.6* 8.0* 8.6* 8.7*  HCT 34.4* 31.7* 26.3* 24.1* 26.6* 27.2*  MCV 96.1 92.7 94.9 93.4 94.7 97.5  PLT 152 171 141* 123* 122* 124*   Cardiac Enzymes: No results for input(s): CKTOTAL, CKMB, CKMBINDEX, TROPONINI in the last 168 hours. BNP (last 3 results) No results for input(s): PROBNP in the last 8760 hours. CBG:  Recent Labs Lab 03/31/14 2002 04/01/14 0014  GLUCAP 106* 107*    Recent Results (from the past 240 hour(s))  MRSA PCR Screening     Status: None   Collection Time: 03/30/14  3:20 PM  Result Value  Ref Range Status   MRSA by PCR NEGATIVE NEGATIVE Final    Comment:        The GeneXpert MRSA Assay (FDA approved for NASAL specimens only), is one component of a comprehensive MRSA colonization surveillance program. It is not intended to diagnose MRSA infection nor to guide or monitor treatment for MRSA infections.   Culture, Urine     Status: None   Collection Time: 03/30/14  7:04 PM  Result Value Ref Range Status   Specimen Description URINE, RANDOM  Final   Special Requests NONE  Final   Culture  Setup Time   Final    03/31/2014 02:30 Performed at [place]   Final    50,000 COLONIES/ML Performed at [facility]    Culture   Final    Multiple bacterial morphotypes present, none predominant. Suggest appropriate recollection if clinically indicated. Performed at [facility]    Report Status 04/01/2014 FINAL  Final     Studies:  Recent x-ray studies have been reviewed in  detail by the Attending Physician  Scheduled Meds:  Scheduled Meds: . calcitRIOL  0.5 mcg Oral Daily  . feeding supplement (RESOURCE BREEZE)  1 Container Oral TID BM  . fluticasone  2 puff Inhalation BID  . folic acid  1 mg Oral Daily  . heparin subcutaneous  5,000 Units Subcutaneous 3 times per day  . levothyroxine  125 mcg Oral QAC breakfast  . potassium chloride  20 mEq Oral TID  . predniSONE  5 mg Oral Q breakfast  . sodium bicarbonate  650 mg Oral Daily  . Vitamin D (Ergocalciferol)  50,000 Units Oral Once per day on Sun Wed    Time spent on care of this patient: 40 mins   Jorma Tassinari, Geraldo Docker , MD   [facility] Office  [phone] Pager [phone]  On-Call/Text Page:      [email or web address]      password [email or web address]  If 7PM-7AM, please contact night-coverage [email or web address] Password [email or web address] 04/04/2014, 10:54 AM   LOS: 6 days

## 2014-04-04 NOTE — Progress Notes (Signed)
New Admission Note  Arrival: 2130 Mental Orientation: AOx4 Telemetry: TX04 NSR Assessment: See flow sheet. IV: RFA SL Safety Measures: Side rails in place, fall risk assessment complete with patient verbalizing understanding of risks associated with falls. Pt verbalizes an understanding of how to use the call bell and to call for help before getting out of bed. Call bell within reach, bed in lowest position, non-skid socks applied. [place] Orientation: Pt orientation to unit, room and routine. Information packet given to patient and safety video watched.  Admission armband ID verified with patient and in place.   Orders have been reviewed and implemented. Will continue to monitor and assist as needed.  Velora Mediate, RN 04/04/2014 9:38 PM

## 2014-04-04 NOTE — Plan of Care (Signed)
Problem: Phase II Progression Outcomes Goal: Progress activity as tolerated unless otherwise ordered Outcome: Completed/Met Date Met:  04/04/14 Goal: Discharge plan established Outcome: Progressing Goal: IV changed to normal saline lock Outcome: Completed/Met Date Met:  04/04/14  Problem: Phase III Progression Outcomes Goal: Pain controlled on oral analgesia Outcome: Progressing Goal: Activity at appropriate level-compared to baseline (UP IN CHAIR FOR HEMODIALYSIS)  Outcome: Progressing

## 2014-04-04 NOTE — Progress Notes (Signed)
Patient ID: Natasha [name], female    DOB: 1945/11/03, 68 y.o.   MRN: [id number] Nephrology Progress Note  S: Patient states she has some right shoulder pain, otherwise no complaints overnight. She has a great appetite and states she is peeing frequently. She continues to have bowel movements daily, although still loose. UOP possibly not all recorded- elytes much better and creatinine improved- a little LE edema  O:BP 87/54 mmHg  Pulse 99  Temp(Src) 99 F (37.2 C) (Oral)  Resp 12  Ht 5' 4"  (1.626 m)  Wt 115 lb 1.3 oz (52.2 kg)  BMI 19.74 kg/m2  SpO2 95%  Intake/Output Summary (Last 24 hours) at 04/04/14 0725 Last data filed at 04/03/14 1833  Gross per 24 hour  Intake    480 ml  Output    800 ml  Net   -320 ml   Intake/Output: I/O last 3 completed shifts: In: 1405 [P.O.:480; I.V.:825; IV Piggyback:100] Out: 2700 [Urine:2700]  Intake/Output this shift:    Weight change:  Gen: Sitting in bed, in no distress JQZ:ESPQZRA rate and rhythm without murmur Resp:Clear to auscultation bilaterally without rales or wheezing QTM:AUQJ, non-tender, +bowel sounds Ext: 2+ edema bilaterally in LE- below shins   Recent Labs Lab 03/30/14 0735 03/30/14 1630 03/30/14 2308 03/31/14 0313 04/01/14 0335 04/02/14 0330 04/03/14 0331 04/04/14 0547  NA 147 144 147 152* 146 143 144 142  K 3.3* 3.3* 2.9* 3.1* 2.9* 2.6* 2.8* 4.6  CL 122* 120* 119* 122* 113* 105 104 105  CO2 7* <7* 10* 11* 14* 24 27 25   GLUCOSE 97 105* 97 76 88 96 91 83  BUN 57* 57* 56* 56* 52* 39* 36* 33*  CREATININE 5.83* 5.76* 5.76* 5.78* 5.53* 4.62* 4.16* 3.62*  ALBUMIN 3.0* 3.0*  3.0*  --  2.7* 2.4* 2.2* 2.2* 2.2*  CALCIUM 6.1* 6.1* 7.9* 7.9* 6.6* 6.4* 6.9* 7.5*  PHOS  --  3.9  --  3.2  --  2.7 2.5 2.7  AST 14 17  --  14 14 13   --  13  ALT 9 9  --  8 7 7   --  7   :  Recent Labs Lab 04/01/14 0335 04/02/14 0330 04/03/14 0331 04/04/14 0547  AST 14 13  --  13  ALT 7 7  --  7  ALKPHOS 123* 110  --  97  BILITOT 0.4  0.4  --  0.5  PROT 5.0* 4.8*  --  5.4*  ALBUMIN 2.4* 2.2* 2.2* 2.2*   Results for Natasha, [name] (MRN [id number]) as of 04/01/2014 10:39  Ref. Range 03/29/2014 23:04  Vit D, 25-Hydroxy Latest Range: 30-89 ng/mL 20 (L)    Recent Labs Lab 03/30/14 1630 04/04/14 0547  LIPASE 208* 42    Recent Labs Lab 03/31/14 0313 04/01/14 0335 04/02/14 0330 04/03/14 0331 04/04/14 0547  WBC 6.6 5.6 5.7 6.8 6.8  NEUTROABS 4.6 3.3 3.2 4.0  --   HGB 10.6* 8.6* 8.0* 8.6* 8.7*  HCT 31.7* 26.3* 24.1* 26.6* 27.2*  MCV 92.7 94.9 93.4 94.7 97.5  PLT 171 141* 123* 122* 124*    Lab Results  Component Value Date   IRON 71 04/01/2014   TIBC 190* 04/01/2014   IRONPCTSAT 37 04/01/2014   UIBC 119* 04/01/2014   FERRITIN 186 04/01/2014    Medications . calcitRIOL  0.5 mcg Oral Daily  . feeding supplement (RESOURCE BREEZE)  1 Container Oral TID BM  . fluticasone  2 puff Inhalation BID  . folic acid  1 mg Oral Daily  . heparin subcutaneous  5,000 Units Subcutaneous 3 times per day  . levothyroxine  125 mcg Oral QAC breakfast  . potassium chloride  40 mEq Oral TID  . predniSONE  5 mg Oral Q breakfast  . sodium bicarbonate  650 mg Oral Daily  . Vitamin D (Ergocalciferol)  50,000 Units Oral Once per day on Sun Wed     Background: 68yo female with a history of CKD4, COPD, and Crohn's disease admitted for AKI with anion gap acidosis, profound hypokalemia, hypomagnesemia after several days of diarrhea, followed by a week or more of inability to eat, drink or take her bicarb/K supplements as an outpt.  Has baseline CKD 4 (creatinine 2.93 when she last saw Dr. Mercy Moore 8/15).   Suspect possible AKI/ATN due to hypovolemia when looking at overall clinical picture.  Creatinine improving with resolved hypokalemia. Non-oliguric.   Assessment/Plan:  1. AKI with anion gap metabolic acidosis: Anion gap has closed and metabolic acidosis has resolved. AKI most likely a result of hypovolemia with patient's initial  tachycardia with high sodium and clear chest x-ray in addition to presenting history. Renal function continues to improve slowly with IVF and oral intake. Creatinine continues to decrease (3.62 on 11/10). UOP of 834m/24hr. Patient has developed edema although IVF discontinued yesterday. No other signs of fluid overload. Would benefit from TED stockings and leg elevation while laying/sitting down. Is taking PO's well. Patient to continue oral bicarb as OP- she prefers pills. Continue bicarb 650 qd for now. Patient can probably be transferred to med-surg, although per primary team. Will not give lasix yet given issues in past 2. CKD4: Followed by Dr. MMercy Mooreoutpatient. Last seen in 12/2013 with a creatinine of 2.93. Approaching baseline 3. Hypernatremia: Resolved. Back on PO's 4. Non-obstructive nephrolithiasis: bilateral. Seen on CT scan. Do not suspect this is causing AKI. 5. Hypocalcemia: Corrected to 7.9 today. S/p calcium gluconate 4g on 11/5 and 1g on 11/7. Vit D def. (see below) 6. Hypomagnesemia: 1.0 on admission. s/p magnesium sulfate 2g IV. Peaked to 3.3. Repleted and is 2.1 today (11/10). pills as OP problematic because they will interfere with bowels 7. Hypokalemia: Takes K 257m BID at home. Potassium of 2.9 on admission. Is currently wnl. Will cut back to 2077mTID since patient is eating better.  8. Chron's disease with no evidence of acute exacerbation on CT (11/4). Patient actually more constipated than usual (having about 0-1 BMs per day down from around 4 per day). Management per primary. Does note that she had severe diarrhea  couple weeks back then poor po since due to n/v 9. Hypothyroidism: on synthroid. Management per primary team 10. 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 Anemia - Hb has steadily dropped with hydration. Continues to drop to 8 today (11/8). 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 FOBT ordered but not yet obtained. Iron is OK- needs aranesp- will give dose here- give 11/11  RalCordelia PocheD PGY-2, [facility] 04/04/2014, 7:25 AM   Patient seen and examined, agree with above note with above modifications. Looks good , creatinine down and potassium up.  Bicarb is holding on PO bicarb only.  Have decrease potassium supps and have stopped IVF.  Will not give lasix yet given recent issues but may have overshot with IVF- Would rec getting up and around and possible discharge tomorrow if numbers are stable  Corliss Parish, MD 04/04/2014

## 2014-04-05 LAB — CBC WITH DIFFERENTIAL/PLATELET
Basophils Absolute: 0 10*3/uL (ref 0.0–0.1)
Basophils Relative: 0 % (ref 0–1)
Eosinophils Absolute: 0.1 10*3/uL (ref 0.0–0.7)
Eosinophils Relative: 1 % (ref 0–5)
HEMATOCRIT: 25.5 % — AB (ref 36.0–46.0)
HEMOGLOBIN: 8 g/dL — AB (ref 12.0–15.0)
LYMPHS PCT: 26 % (ref 12–46)
Lymphs Abs: 1.9 10*3/uL (ref 0.7–4.0)
MCH: 31.4 pg (ref 26.0–34.0)
MCHC: 31.4 g/dL (ref 30.0–36.0)
MCV: 100 fL (ref 78.0–100.0)
MONOS PCT: 11 % (ref 3–12)
Monocytes Absolute: 0.8 10*3/uL (ref 0.1–1.0)
NEUTROS ABS: 4.6 10*3/uL (ref 1.7–7.7)
Neutrophils Relative %: 62 % (ref 43–77)
Platelets: 130 10*3/uL — ABNORMAL LOW (ref 150–400)
RBC: 2.55 MIL/uL — AB (ref 3.87–5.11)
RDW: 14.7 % (ref 11.5–15.5)
WBC: 7.3 10*3/uL (ref 4.0–10.5)

## 2014-04-05 LAB — MAGNESIUM: MAGNESIUM: 1.7 mg/dL (ref 1.5–2.5)

## 2014-04-05 LAB — COMPREHENSIVE METABOLIC PANEL
ALK PHOS: 88 U/L (ref 39–117)
ALT: 7 U/L (ref 0–35)
AST: 12 U/L (ref 0–37)
Albumin: 2.1 g/dL — ABNORMAL LOW (ref 3.5–5.2)
Anion gap: 12 (ref 5–15)
BILIRUBIN TOTAL: 0.3 mg/dL (ref 0.3–1.2)
BUN: 33 mg/dL — AB (ref 6–23)
CHLORIDE: 109 meq/L (ref 96–112)
CO2: 21 meq/L (ref 19–32)
Calcium: 8.5 mg/dL (ref 8.4–10.5)
Creatinine, Ser: 3.44 mg/dL — ABNORMAL HIGH (ref 0.50–1.10)
GFR calc Af Amer: 15 mL/min — ABNORMAL LOW (ref >90)
GFR, EST NON AFRICAN AMERICAN: 13 mL/min — AB (ref >90)
Glucose, Bld: 81 mg/dL (ref 70–99)
POTASSIUM: 4.8 meq/L (ref 3.7–5.3)
Sodium: 142 mEq/L (ref 137–147)
Total Protein: 5.4 g/dL — ABNORMAL LOW (ref 6.0–8.3)

## 2014-04-05 LAB — OCCULT BLOOD X 1 CARD TO LAB, STOOL: FECAL OCCULT BLD: NEGATIVE

## 2014-04-05 LAB — PHOSPHORUS: Phosphorus: 4 mg/dL (ref 2.3–4.6)

## 2014-04-05 MED ORDER — POTASSIUM CHLORIDE CRYS ER 20 MEQ PO TBCR
20.0000 meq | EXTENDED_RELEASE_TABLET | Freq: Two times a day (BID) | ORAL | Status: DC
  Administered 2014-04-05 – 2014-04-07 (×4): 20 meq via ORAL
  Filled 2014-04-05 (×5): qty 1

## 2014-04-05 MED ORDER — ACETAMINOPHEN 325 MG PO TABS
650.0000 mg | ORAL_TABLET | Freq: Four times a day (QID) | ORAL | Status: DC | PRN
  Administered 2014-04-05 (×2): 650 mg via ORAL
  Filled 2014-04-05 (×2): qty 2

## 2014-04-05 MED ORDER — SODIUM BICARBONATE 650 MG PO TABS
650.0000 mg | ORAL_TABLET | Freq: Two times a day (BID) | ORAL | Status: DC
  Administered 2014-04-05 – 2014-04-07 (×4): 650 mg via ORAL
  Filled 2014-04-05 (×5): qty 1

## 2014-04-05 NOTE — Plan of Care (Signed)
Problem: Phase II Progression Outcomes Goal: Discharge plan established Outcome: Progressing

## 2014-04-05 NOTE — Care Management Note (Signed)
CARE MANAGEMENT NOTE 04/05/2014  Patient:  Natasha [name], Natasha [name]   Account Number:  [id number]  Date Initiated:  03/31/2014  Documentation initiated by:  MAYO,HENRIETTA  Subjective/Objective Assessment:   dx metabolic acidosis 2/2 nephrolithiasis  lives with spouse    PCP  Martin Majestic     Action/Plan:   Anticipated DC Date:     Anticipated DC Plan:        DC Planning Services  CM consult      PAC Choice  [place]   Choice offered to / List presented to:  C-1 Patient   DME arranged  Vassie Moselle      DME agency  [place] arranged  [place].   Status of service:  In process, will continue to follow Medicare Important Message given?  YES (If response is "NO", the following Medicare IM given date fields will be blank) Date Medicare IM given:  04/04/2014 Medicare IM given by:  MAYO,HENRIETTA Date Additional Medicare IM given:   Additional Medicare IM given by:    Discharge Disposition:    Per UR Regulation:  Reviewed for med. necessity/level of care/duration of stay  If discussed at [place] of Stay Meetings, dates discussed:   04/04/2014    Comments:  04/04/14 [place] MSN BSN CCM PT recommends home therapy and rolling walker.  Provided list of agencies and pt chose [facility], stated she had therapy with them previously.  Provided copay cost for walker @ her request.

## 2014-04-05 NOTE — Progress Notes (Signed)
BP this AM is 98/58, patient asymptomatic. Resting at this time without distress or complaints. Raliegh Ip Schorr made aware. Will continue to monitor. Velora Mediate

## 2014-04-05 NOTE — Progress Notes (Signed)
Patient ID: Natasha [name], female    DOB: [birth year], 68 y.o.   MRN: [id number] Nephrology Progress Note  S: Patient continues to feel fine. Her shoulder pain persists. She also had left leg pain which was improved after taking off her TED hose. No complaints overnight.  O:BP 98/58 mmHg  Pulse 89  Temp(Src) 98.6 F (37 C) (Oral)  Resp 16  Ht 5' 4"  (1.626 m)  Wt 115 lb 1.3 oz (52.2 kg)  BMI 19.74 kg/m2  SpO2 95%  Intake/Output Summary (Last 24 hours) at 04/05/14 0746 Last data filed at 04/04/14 1507  Gross per 24 hour  Intake    480 ml  Output    501 ml  Net    -21 ml   Intake/Output: I/O last 3 completed shifts: In: 480 [P.O.:480] Out: 501 [Urine:500; Stool:1]  Intake/Output this shift:    Weight change:  Gen: Sitting in bed, in no distress, watching TV GGY:IRSWNIO rate and rhythm without murmur Resp:Clear to auscultation bilaterally without rales or wheezing EVO:JJKK, non-tender, +bowel sounds Ext: 2+ edema bilaterally in LE- below shins   Recent Labs Lab 03/30/14 0735 03/30/14 1630 03/30/14 2308 03/31/14 0313 04/01/14 0335 04/02/14 0330 04/03/14 0331 04/04/14 0547 04/05/14 0545  NA 147 144 147 152* 146 143 144 142 142  K 3.3* 3.3* 2.9* 3.1* 2.9* 2.6* 2.8* 4.6 4.8  CL 122* 120* 119* 122* 113* 105 104 105 109  CO2 7* <7* 10* 11* 14* 24 27 25 21   GLUCOSE 97 105* 97 76 88 96 91 83 81  BUN 57* 57* 56* 56* 52* 39* 36* 33* 33*  CREATININE 5.83* 5.76* 5.76* 5.78* 5.53* 4.62* 4.16* 3.62* 3.44*  ALBUMIN 3.0* 3.0*  3.0*  --  2.7* 2.4* 2.2* 2.2* 2.2* 2.1*  CALCIUM 6.1* 6.1* 7.9* 7.9* 6.6* 6.4* 6.9* 7.5* 8.5  PHOS  --  3.9  --  3.2  --  2.7 2.5 2.7 4.0  AST 14 17  --  14 14 13   --  13 12  ALT 9 9  --  8 7 7   --  7 7   :  Recent Labs Lab 04/02/14 0330 04/03/14 0331 04/04/14 0547 04/05/14 0545  AST 13  --  13 12  ALT 7  --  7 7  ALKPHOS 110  --  97 88  BILITOT 0.4  --  0.5 0.3  PROT 4.8*  --  5.4* 5.4*  ALBUMIN 2.2* 2.2* 2.2* 2.1*   Results for  Natasha [name], Natasha [name] (MRN [id number]) as of 04/01/2014 10:39  Ref. Range 03/29/2014 23:04  Vit D, 25-Hydroxy Latest Range: 30-89 ng/mL 20 (L)    Recent Labs Lab 03/30/14 1630 04/04/14 0547  LIPASE 208* 42    Recent Labs Lab 04/01/14 0335 04/02/14 0330 04/03/14 0331 04/04/14 0547 04/05/14 0545  WBC 5.6 5.7 6.8 6.8 7.3  NEUTROABS 3.3 3.2 4.0  --  4.6  HGB 8.6* 8.0* 8.6* 8.7* 8.0*  HCT 26.3* 24.1* 26.6* 27.2* 25.5*  MCV 94.9 93.4 94.7 97.5 100.0  PLT 141* 123* 122* 124* 130*    Lab Results  Component Value Date   IRON 71 04/01/2014   TIBC 190* 04/01/2014   IRONPCTSAT 37 04/01/2014   UIBC 119* 04/01/2014   FERRITIN 186 04/01/2014    Medications . calcitRIOL  0.5 mcg Oral Daily  . darbepoetin (ARANESP) injection - NON-DIALYSIS  100 mcg Subcutaneous Q Wed-1800  . feeding supplement (RESOURCE BREEZE)  1 Container Oral TID BM  . fluticasone  2  puff Inhalation BID  . folic acid  1 mg Oral Daily  . heparin subcutaneous  5,000 Units Subcutaneous 3 times per day  . levothyroxine  125 mcg Oral QAC breakfast  . potassium chloride  20 mEq Oral TID  . predniSONE  5 mg Oral Q breakfast  . sodium bicarbonate  650 mg Oral Daily  . Vitamin D (Ergocalciferol)  50,000 Units Oral Once per day on Sun Wed     Background: 68yo female with a history of CKD4, COPD, and Crohn's disease admitted for AKI with anion gap acidosis, profound hypokalemia, hypomagnesemia after several days of diarrhea, followed by a week or more of inability to eat, drink or take her bicarb/K supplements as an outpt.  Has baseline CKD 4 (creatinine 2.93 when she last saw Dr. Mercy Moore 8/15).   Suspect possible AKI/ATN due to hypovolemia when looking at overall clinical picture.  Creatinine improving with resolved hypokalemia. Non-oliguric.   Assessment/Plan:  1. AKI with anion gap metabolic acidosis: Anion gap has closed and metabolic acidosis has resolved. AKI most likely a result of hypovolemia with patient's initial  tachycardia with high sodium and clear chest x-ray in addition to presenting history. Renal function continues to improve slowly with IVF and oral intake. Creatinine continues to decrease (3.44 on 11/11). UOP of 56m/24hr.  No other signs of fluid overload other than some LE edema. Is taking PO's well. Increase bicarb to BID dosing. Will not give lasix yet given issues in past. 2. CKD4: Followed by Dr. MMercy Mooreoutpatient. Last seen in 12/2013 with a creatinine of 2.93. Approaching baseline 3. Hypernatremia: Resolved. Back on PO's 4. Non-obstructive nephrolithiasis: bilateral. Seen on CT scan. Do not suspect this is causing AKI. 5. Hypocalcemia: Corrected to 7.9 today. S/p calcium gluconate 4g on 11/5 and 1g on 11/7. Vit D def. (see below) 6. Hypomagnesemia: 1.0 on admission. s/p magnesium sulfate 2g IV. Peaked to 3.3. Repleted and is 1.7 today (11/11). pills as OP problematic because they will interfere with bowels 7. Hypokalemia: Takes K 2101m BID at home. Potassium of 2.9 on admission. Is currently wnl but creeping up. Will cut back to 2076mBID since patient continues to eat better.  8. Crohn's disease with no evidence of acute exacerbation on CT (11/4). Patient actually more constipated than usual (having about 0-1 BMs per day down from around 4 per day). Management per primary. Does note that she had severe diarrhea  couple weeks back then poor po since due to n/v 9. Hypothyroidism: on synthroid. Management per primary team 10. 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 Anemia - Hb has steadily dropped with hydration. Continues to drop to 8 today (11/8). Iron panel suggest anemia of chronic inflammation/CKD. FOBT ordered but not yet obtained. Iron is OK- needs aranesp- will give dose here- give 11/11  RalCordelia PocheD PGY-2, [facility] 04/05/2014, 7:46 AM   Patient seen and examined,  agree with above note with above modifications. Pt looks well.  Could be discharged home by my standpoint. Will continue potassium at home dose 20 BID, bicarb 650 BID and no diuretics for now.  She has a regularly scheduled appt with Dr. MAtMercy Mooreter this month.  Renal will sign off, call with questions KelCorliss ParishD 04/05/2014

## 2014-04-05 NOTE — Progress Notes (Signed)
[facility] TEAM 1 - Stepdown/ICU TEAM Progress Note  Natasha [name] LFY:[id number] DOB: 28-May-1945 DOA: 03/29/2014 PCP: Natasha Stagers, MD  Admit HPI / Brief Narrative: 68 y.o. BF PMHx stage 4 CKD, chrons disease s/p colectomy, COPD, HTN presenting with abd pain, AKI, anion gap metabolic acidosis, hypokalemia, nephrolithiasis. Pt reports abd pain/back pain over the past 1-2 weeks. States that she saw her PCP about back pain 2 weeks ago. Had otherwise negative workup per pt. Was put on vicodin with improvement in sxs. Pt states that she then went on to developed generalized abd pain and nausea. Denies any diarrhea or dysuria. States that she felt that she was becoming progressively dehydrated over course of abd pain. Tried to take in oral fluids, but feels like she was unsuccessful. Followed by Dr Natasha [name] outpt for CKD. Has prn lasix she takes for edema. Denies taking this within last 1-2 weeks. Saw her PCP because of sxs. PCP ordered CT abd pel w/o contrast and bloodwork. CT showed no acute findings concerning for colitis of pancreatitis. Did show multiple bilateral non obstructing kidney stones. BMET showed Cr 5.94 (baselin around 2.2), Bicarb 8, AG 21, BUN 60, K 2.9, Mg 1.0 Presented to ER afebrile, HR 100s, resp 10s, BP 110s-130s, Satting 98% on RA. CXR shows underlying emphysema w/o acute abnormality. Nephrology formally consulted per ED PA Mable Fill).   HPI/Subjective: Still complaining of left shoulder pain, musculoskeletal   Assessment/Plan: Anion gap Metabolic acidosis-hyperchloremic -etiology felt to be hypovolemia - creatinine slowly improving,  - Nephrology following  - gap has closed / acidosis resolved - transitioned to oral bicarb   Abd pain/nephrolithiasis  -most likely multifactorial to include hypocalcemia, hypomagnesemia, nephrolithiasis- stone burden,  -otherwise non acute CT  -resolved -Continue to aggressively replete electrolytes  Combined  systolic and diastolic CHF -at home on Lasix 40 mg daily will hold -when patient's BP tolerates would start on beta blocker weight trend since admission: 107 lbs --> 108 lbs --> 114 --> 117 lbs  - will continue to monitor daily weights, strict intake and output - no evidence of volume overload or distress on exam   HTN -see combined systolic and diastolic CHF  Acute on chronic kidney failure  -baseline Cr ~ 2.1, on admission Cr = 7.3, 11/11 Cr = 3.44 -hold potential offending meds   hypokalemia/hypomagnesemia  -potassium goal>4 -magnesium goal> 2  -continue to monitor aggressively and replete as needed  Secondary hyperparathyroidism/Hypocalcemia -continue calcitriol 0.5 g daily  Hypothyroidism -Synthroid 125 g daily  Crohn's disease -Continue home dose of prednisone 5 mg daily  COPD/Emphysema  -Spiriva daily -Qvar 80 g 2 puffs daily(Flovent substituted for Qvar) -albuterol QID PRN   Right shoulder pain -X-ray right shoulder  Thrombocytopenia - platelet count has stabilized around 120 - follow trend   Severe protein calorie malnutrition - Advance diet as patient able to tolerate   Code Status: FULL Family Communication: family present at time of exam Disposition Plan: resolution of above problems    Consultants: Nephrology consult pending   Procedure/Significant Events: 2/16 echocardiogram;Left ventricle: mildly dilated.-LVEF= 55%. - Aortic valve: Moderate regurgitation. -significantly abnormal diastolicfunction 85/2 CT abdomen pelvis without contrast; space S/P right hemicolectomy.  -No gross inflammatory changes associated with the small bowel or colon to suggest presence of an acute Crohn'sexacerbation. - The pancreas is normal in appearance  -Numerous nonobstructive calculi within the collecting systems of kidneys bilaterally, largest of which measures up to 6 mm in upper pole collecting system of the right kidney.  -  No ureteral stones or  findings of urinary tract obstruction     Culture 11/5 MRSA by PCR negative  Antibiotics: NA  DVT prophylaxis: Subcutaneous heparin   Devices    LINES / TUBES:      Continuous Infusions:    Objective: VITAL SIGNS: Temp: 98.4 F (36.9 C) (11/11 0952) Temp Source: Oral (11/11 0952) BP: 89/54 mmHg (11/11 0952) Pulse Rate: 96 (11/11 0952) SPO2; FIO2:   Intake/Output Summary (Last 24 hours) at 04/05/14 1055 Last data filed at 04/05/14 0909  Gross per 24 hour  Intake    480 ml  Output    501 ml  Net    -21 ml     Exam: General: A/O x 4, sitting on the edge of the bed, only complaint she right shoulder pain,negative back/suprapubic pain.,No acute respiratory distress Lungs: Clear to auscultation bilaterally without wheezes or crackles Cardiovascular: Regular rate and rhythm without murmur gallop or rub normal S1 and S2 Abdomen: nontender, nondistended, soft, bowel sounds positive,  Extremities: No significant cyanosis, clubbing, or edema bilateral lower extremities. Right shoulder Pain to AbDuction, palpation AC joint  Data Reviewed: Basic Metabolic Panel:  Recent Labs Lab 03/31/14 0313 04/01/14 0335 04/02/14 0330 04/03/14 0331 04/04/14 0547 04/05/14 0545  NA 152* 146 143 144 142 142  K 3.1* 2.9* 2.6* 2.8* 4.6 4.8  CL 122* 113* 105 104 105 109  CO2 11* 14* 24 27 25 21   GLUCOSE 76 88 96 91 83 81  BUN 56* 52* 39* 36* 33* 33*  CREATININE 5.78* 5.53* 4.62* 4.16* 3.62* 3.44*  CALCIUM 7.9* 6.6* 6.4* 6.9* 7.5* 8.5  MG 3.0* 2.1 1.6 1.3* 2.1 1.7  PHOS 3.2  --  2.7 2.5 2.7 4.0   Liver Function Tests:  Recent Labs Lab 03/31/14 0313 04/01/14 0335 04/02/14 0330 04/03/14 0331 04/04/14 0547 04/05/14 0545  AST 14 14 13   --  13 12  ALT 8 7 7   --  7 7  ALKPHOS 158* 123* 110  --  97 88  BILITOT 0.6 0.4 0.4  --  0.5 0.3  PROT 5.8* 5.0* 4.8*  --  5.4* 5.4*  ALBUMIN 2.7* 2.4* 2.2* 2.2* 2.2* 2.1*    Recent Labs Lab 03/30/14 1630 04/04/14 0547  LIPASE  208* 42   No results for input(s): AMMONIA in the last 168 hours. CBC:  Recent Labs Lab 03/31/14 0313 04/01/14 0335 04/02/14 0330 04/03/14 0331 04/04/14 0547 04/05/14 0545  WBC 6.6 5.6 5.7 6.8 6.8 7.3  NEUTROABS 4.6 3.3 3.2 4.0  --  4.6  HGB 10.6* 8.6* 8.0* 8.6* 8.7* 8.0*  HCT 31.7* 26.3* 24.1* 26.6* 27.2* 25.5*  MCV 92.7 94.9 93.4 94.7 97.5 100.0  PLT 171 141* 123* 122* 124* 130*   Cardiac Enzymes: No results for input(s): CKTOTAL, CKMB, CKMBINDEX, TROPONINI in the last 168 hours. BNP (last 3 results) No results for input(s): PROBNP in the last 8760 hours. CBG:  Recent Labs Lab 03/31/14 2002 04/01/14 0014  GLUCAP 106* 107*    Recent Results (from the past 240 hour(s))  MRSA PCR Screening     Status: None   Collection Time: 03/30/14  3:20 PM  Result Value Ref Range Status   MRSA by PCR NEGATIVE NEGATIVE Final    Comment:        The GeneXpert MRSA Assay (FDA approved for NASAL specimens only), is one component of a comprehensive MRSA colonization surveillance program. It is not intended to diagnose MRSA infection nor to guide or monitor treatment  for MRSA infections.   Culture, Urine     Status: None   Collection Time: 03/30/14  7:04 PM  Result Value Ref Range Status   Specimen Description URINE, RANDOM  Final   Special Requests NONE  Final   Culture  Setup Time   Final    03/31/2014 02:30 Performed at [facility]   Final    50,000 COLONIES/ML Performed at [facility]    Culture   Final    Multiple bacterial morphotypes present, none predominant. Suggest appropriate recollection if clinically indicated. Performed at [facility]    Report Status 04/01/2014 FINAL  Final     Studies:  Recent x-ray studies have been reviewed in detail by the Attending Physician  Scheduled Meds:  Scheduled Meds: . calcitRIOL  0.5 mcg Oral Daily  . darbepoetin (ARANESP) injection - NON-DIALYSIS  100 mcg Subcutaneous Q  Wed-1800  . feeding supplement (RESOURCE BREEZE)  1 Container Oral TID BM  . fluticasone  2 puff Inhalation BID  . folic acid  1 mg Oral Daily  . heparin subcutaneous  5,000 Units Subcutaneous 3 times per day  . levothyroxine  125 mcg Oral QAC breakfast  . potassium chloride  20 mEq Oral TID  . predniSONE  5 mg Oral Q breakfast  . sodium bicarbonate  650 mg Oral Daily  . Vitamin D (Ergocalciferol)  50,000 Units Oral Once per day on Sun Wed    Time spent on care of this patient: 57 mins   Klara Stjames , MD   [facility] Pager [phone] Office  [phone]   On-Call/Text Page:      [email or web address]      password [email or web address]  If 7PM-7AM, please contact night-coverage [email or web address] Password [email or web address] 04/05/2014, 10:55 AM   LOS: 7 days

## 2014-04-06 DIAGNOSIS — I82402 Acute embolism and thrombosis of unspecified deep veins of left lower extremity: Secondary | ICD-10-CM

## 2014-04-06 DIAGNOSIS — M7989 Other specified soft tissue disorders: Secondary | ICD-10-CM

## 2014-04-06 LAB — CBC WITH DIFFERENTIAL/PLATELET
Basophils Absolute: 0 10*3/uL (ref 0.0–0.1)
Basophils Relative: 0 % (ref 0–1)
Eosinophils Absolute: 0.1 10*3/uL (ref 0.0–0.7)
Eosinophils Relative: 1 % (ref 0–5)
HEMATOCRIT: 26.1 % — AB (ref 36.0–46.0)
HEMOGLOBIN: 7.9 g/dL — AB (ref 12.0–15.0)
LYMPHS ABS: 1.9 10*3/uL (ref 0.7–4.0)
Lymphocytes Relative: 26 % (ref 12–46)
MCH: 31.3 pg (ref 26.0–34.0)
MCHC: 30.3 g/dL (ref 30.0–36.0)
MCV: 103.6 fL — ABNORMAL HIGH (ref 78.0–100.0)
MONOS PCT: 8 % (ref 3–12)
Monocytes Absolute: 0.6 10*3/uL (ref 0.1–1.0)
NEUTROS ABS: 4.8 10*3/uL (ref 1.7–7.7)
Neutrophils Relative %: 65 % (ref 43–77)
Platelets: 138 10*3/uL — ABNORMAL LOW (ref 150–400)
RBC: 2.52 MIL/uL — AB (ref 3.87–5.11)
RDW: 14.6 % (ref 11.5–15.5)
WBC: 7.4 10*3/uL (ref 4.0–10.5)

## 2014-04-06 LAB — COMPREHENSIVE METABOLIC PANEL
ALT: 7 U/L (ref 0–35)
ANION GAP: 14 (ref 5–15)
AST: 12 U/L (ref 0–37)
Albumin: 2.2 g/dL — ABNORMAL LOW (ref 3.5–5.2)
Alkaline Phosphatase: 79 U/L (ref 39–117)
BUN: 28 mg/dL — AB (ref 6–23)
CHLORIDE: 111 meq/L (ref 96–112)
CO2: 16 mEq/L — ABNORMAL LOW (ref 19–32)
Calcium: 8.2 mg/dL — ABNORMAL LOW (ref 8.4–10.5)
Creatinine, Ser: 3.23 mg/dL — ABNORMAL HIGH (ref 0.50–1.10)
GFR calc non Af Amer: 14 mL/min — ABNORMAL LOW (ref >90)
GFR, EST AFRICAN AMERICAN: 16 mL/min — AB (ref >90)
GLUCOSE: 76 mg/dL (ref 70–99)
Potassium: 4.9 mEq/L (ref 3.7–5.3)
Sodium: 141 mEq/L (ref 137–147)
Total Protein: 5.3 g/dL — ABNORMAL LOW (ref 6.0–8.3)

## 2014-04-06 LAB — MAGNESIUM: Magnesium: 1.5 mg/dL (ref 1.5–2.5)

## 2014-04-06 LAB — PHOSPHORUS: Phosphorus: 3.1 mg/dL (ref 2.3–4.6)

## 2014-04-06 MED ORDER — ENOXAPARIN SODIUM 60 MG/0.6ML ~~LOC~~ SOLN
50.0000 mg | SUBCUTANEOUS | Status: DC
  Administered 2014-04-06 – 2014-04-07 (×2): 50 mg via SUBCUTANEOUS
  Filled 2014-04-06 (×2): qty 0.6

## 2014-04-06 MED ORDER — WARFARIN - PHARMACIST DOSING INPATIENT: Freq: Every day | Status: DC

## 2014-04-06 MED ORDER — CALCIUM CARBONATE ANTACID 500 MG PO CHEW
1.0000 | CHEWABLE_TABLET | Freq: Three times a day (TID) | ORAL | Status: DC | PRN
  Administered 2014-04-06: 200 mg via ORAL
  Filled 2014-04-06 (×3): qty 1

## 2014-04-06 MED ORDER — WARFARIN SODIUM 5 MG PO TABS
5.0000 mg | ORAL_TABLET | Freq: Once | ORAL | Status: AC
  Administered 2014-04-06: 5 mg via ORAL
  Filled 2014-04-06 (×2): qty 1

## 2014-04-06 NOTE — Progress Notes (Signed)
ANTICOAGULATION CONSULT NOTE - Initial Consult  Pharmacy Consult for Lovenox and warfarin Indication: DVT  Allergies  Allergen Reactions  . Mercaptopurine Other (See Comments)    Unknown reaction  . Remicade [Infliximab] Other (See Comments)    Unknown reaction    Patient Measurements: Height: 5' 4"  (162.6 cm) Weight: 115 lb (52.164 kg) IBW/kg (Calculated) : 54.7   Vital Signs: Temp: 98.2 F (36.8 C) (11/12 0932) Temp Source: Oral (11/12 0932) BP: 103/54 mmHg (11/12 0932) Pulse Rate: 99 (11/12 0932)  Labs:  Recent Labs  04/04/14 0547 04/05/14 0545 04/06/14 0450  HGB 8.7* 8.0* 7.9*  HCT 27.2* 25.5* 26.1*  PLT 124* 130* 138*  CREATININE 3.62* 3.44* 3.23*    Estimated Creatinine Clearance: 13.9 mL/min (by C-G formula based on Cr of 3.23).   Medical History: Past Medical History  Diagnosis Date  . Chronic kidney disease   . GERD (gastroesophageal reflux disease)   . Crohn's colitis   . CKD (chronic kidney disease) 06/09/2012  . Hypothyroidism 06/10/2012  . Arthritis   . Osteoporosis   . Hypertension   . Plantar warts   . Postmenopausal   . Glaucoma   . Bulging disc     L3-L4  . Atypical chest pain   . Emphysema/COPD    Assessment: 7 YOF with AoCKD, current SCr 3.23 with est CrCl ~74m/min. Found to have new DVT in left prox-mid femoral vein, mid popliteal vein, mid peroneal veins and mid posterior tibial veins. Discussed with Dr. EWaldron Labsand with her renal function, the only treatment she qualifies for is warfarin with a Lovenox bridge (patients with SCr >2.5 or CrCl <218mmin were excluded from Eliquis trials; Xarelto is contraindicated for VTE treatment in patients with CrCl <3018min) Today is D#1 out of minimum of 5 overlap- INR must be therapeutic x2 after 5 days of treatment before Lovenox can be stopped.  She does have anemia with Hgb 7.9, plts 138- no overt bleeding noted. She was not on anticoagulation PTA. LFTs are normal.  Goal of Therapy:   INR 2-3 Anti-Xa level 0.6-1 units/ml 4hrs after LMWH dose given Monitor platelets by anticoagulation protocol: Yes   Plan:  1. Start Lovenox 1mg50m q24h for CrCl <30mL66m = 50mg 40m q24h 2. Warfarin 5mg po87m tonight 3. CBC q72h minimum, daily INR 4. Follow closely for s/s bleeding  Emilygrace Grothe D. Marvell Tamer, PharmD, BCPS Clinical Pharmacist Pager: [phone] 11:53 AM

## 2014-04-06 NOTE — Progress Notes (Signed)
PT Cancellation Note  Patient Details Name: NATASHA BURDA MRN: [id number] DOB: Feb 01, 1946   Cancelled Treatment:    Reason Eval/Treat Not Completed: Patient declined, no reason specified.  Pt declining PT services at this time due to visitors being present in room. PT encouraged pt to participate however pt wanted to visit with her family instead. Will check back as schedule allows.    Rolinda Roan 04/06/2014, 3:54 PM  Rolinda Roan, PT, DPT Acute Rehabilitation Services Pager: [phone]

## 2014-04-06 NOTE — Progress Notes (Signed)
NUTRITION FOLLOW UP  DOCUMENTATION CODES Per approved criteria  -Severe malnutrition in the context of chronic illness   INTERVENTION: Chief Operating Officer. Provide nourishment snacks between meals (crackers, fruit cups). Ordered. RD to follow for nutrition care plan  NUTRITION DIAGNOSIS: Increased nutrient needs related to malnutrition, repletion as evidenced by estimated nutrition needs; ongoing  Goal: Pt to meet >/= 90% of their estimated nutrition needs;met   Monitor:  POl intake, weight, labs, I/O's  68 y.o. female  Admitting Dx: abdominal pain, AKI  ASSESSMENT: 68 y.o. year old Female with PMH of stage 4 CKD, Crohn's disease s/p colectomy, COPD, HTN presenting with abd pain, AKI, AGMA, hypokalemia, nephrolithiasis.   CT showed no acute findings concerning for colitis of pancreatitis. Did show multiple bilateral non obstructing kidney stones.   Pt reports a great appetite with 100% meal completion. Pt does report that [employer] has been giving her diarrhea, as pt is lactose intolerant. Will discontinue [employer]. Pt is agreeable on snacks, such as peanut butter crackers and fruit cups. Will order. Pt was encouraged to continue eating her food at meals.   Patient continues to meet criteria for severe malnutrition in the context of chronic illness as evidenced by severe muscle loss and severe subcutaneous fat loss.  Height: Ht Readings from Last 1 Encounters:  04/05/14 5' 4"  (1.626 m)    Weight: Wt Readings from Last 1 Encounters:  04/05/14 115 lb (52.164 kg)    BMI:  Body mass index is 19.73 kg/(m^2).  Re-Estimated Nutritional Needs: Kcal: 1400-1600 Protein: 70-80 gm Fluid: >/= 1.5 L  Skin: Intact, non-pitting LLE edema  Diet Order: Diet regular   Intake/Output Summary (Last 24 hours) at 04/06/14 1518 Last data filed at 04/06/14 0933  Gross per 24 hour  Intake    480 ml  Output      0 ml  Net    480 ml    Labs:   Recent  Labs Lab 04/04/14 0547 04/05/14 0545 04/06/14 0450  NA 142 142 141  K 4.6 4.8 4.9  CL 105 109 111  CO2 25 21 16*  BUN 33* 33* 28*  CREATININE 3.62* 3.44* 3.23*  CALCIUM 7.5* 8.5 8.2*  MG 2.1 1.7 1.5  PHOS 2.7 4.0 3.1  GLUCOSE 83 81 76    Scheduled Meds: . calcitRIOL  0.5 mcg Oral Daily  . darbepoetin (ARANESP) injection - NON-DIALYSIS  100 mcg Subcutaneous Q Wed-1800  . enoxaparin (LOVENOX) injection  50 mg Subcutaneous Q24H  . feeding supplement (RESOURCE BREEZE)  1 Container Oral TID BM  . fluticasone  2 puff Inhalation BID  . folic acid  1 mg Oral Daily  . levothyroxine  125 mcg Oral QAC breakfast  . potassium chloride  20 mEq Oral BID  . predniSONE  5 mg Oral Q breakfast  . sodium bicarbonate  650 mg Oral BID  . Vitamin D (Ergocalciferol)  50,000 Units Oral Once per day on Sun Wed  . warfarin  5 mg Oral ONCE-1800  . Warfarin - Pharmacist Dosing Inpatient   Does not apply q1800    Continuous Infusions:    Past Medical History  Diagnosis Date  . Chronic kidney disease   . GERD (gastroesophageal reflux disease)   . Crohn's colitis   . CKD (chronic kidney disease) 06/09/2012  . Hypothyroidism 06/10/2012  . Arthritis   . Osteoporosis   . Hypertension   . Plantar warts   . Postmenopausal   . Glaucoma   . Bulging  disc     L3-L4  . Atypical chest pain   . Emphysema/COPD     Past Surgical History  Procedure Laterality Date  . Colon resection    . Esophagogastroduodenoscopy  03/04/2012    Procedure: ESOPHAGOGASTRODUODENOSCOPY (EGD);  Surgeon: Garlan Fair, MD;  Location: [facility];  Service: Endoscopy;  Laterality: N/A;  . Flexible sigmoidoscopy  03/04/2012    Procedure: FLEXIBLE SIGMOIDOSCOPY;  Surgeon: Garlan Fair, MD;  Location: [facility];  Service: Endoscopy;  Laterality: N/A;  . Abdominal hysterectomy    . Appendectomy    . Thyroidectomy      Kallie Locks, MS, RD, LDN Pager # [phone] After hours/ weekend pager # [phone]

## 2014-04-06 NOTE — Progress Notes (Signed)
Pt. On lovenox for DVT in left lower leg. Taught pt. How to give lovenox injections to herself. Pt. Demonstrated understanding with teach back by giving herself the injection.   Penni Bombard, RN 3:34 PM 04/06/2014

## 2014-04-06 NOTE — Progress Notes (Signed)
*  PRELIMINARY RESULTS* Vascular Ultrasound Lower extremity venous duplex has been completed.  Preliminary findings: Right = no evidence of DVT. Left = Partial DVT involving the prox -mid femoral vein, mid popliteal vein, mid peroneal veins and mid posterior tibial veins.  Landry Mellow, RDMS, RVT  04/06/2014, 11:05 AM

## 2014-04-06 NOTE — Progress Notes (Signed)
[place] TEAM 1 - Stepdown/ICU TEAM Progress Note  Natasha [name] MHD:[id number] DOB: 08/25/1945 DOA: 03/29/2014 PCP: Cloyd Stagers, MD  Admit HPI / Brief Narrative: 68 y.o. BF PMHx stage 4 CKD, chrons disease s/p colectomy, COPD, HTN presenting with abd pain, AKI, anion gap metabolic acidosis, hypokalemia, nephrolithiasis. Pt reports abd pain/back pain over the past 1-2 weeks. States that she saw her PCP about back pain 2 weeks ago. Had otherwise negative workup per pt. Was put on vicodin with improvement in sxs. Pt states that she then went on to developed generalized abd pain and nausea. Denies any diarrhea or dysuria. States that she felt that she was becoming progressively dehydrated over course of abd pain. Tried to take in oral fluids, but feels like she was unsuccessful. Followed by Dr Mercy Moore outpt for CKD. Has prn lasix she takes for edema. Denies taking this within last 1-2 weeks. Saw her PCP because of sxs. PCP ordered CT abd pel w/o contrast and bloodwork. CT showed no acute findings concerning for colitis of pancreatitis. Did show multiple bilateral non obstructing kidney stones. BMET showed Cr 5.94 (baselin around 2.2), Bicarb 8, AG 21, BUN 60, K 2.9, Mg 1.0 Presented to ER afebrile, HR 100s, resp 10s, BP 110s-130s, Satting 98% on RA. CXR shows underlying emphysema w/o acute abnormality.   HPI/Subjective: No significant events overnight, and platelets of chest pain, shortness of breath, fever or chills.  Assessment/Plan: Anion gap Metabolic acidosis-hyperchloremic -etiology felt to be hypovolemia - creatinine slowly improving,  - Nephrology following  - gap has closed / acidosis resolved - transitioned to oral bicarb   Acute DVT in left lower extremity -Patient was started on warfarin, being bridged with Lovenox, unfortunately cannot be started on your anticoagulation agent giving her poor renal function.  Abd pain/nephrolithiasis  -most likely  multifactorial to include hypocalcemia, hypomagnesemia, nephrolithiasis- stone burden,  -otherwise non acute CT  -resolved -Continue to aggressively replete electrolytes  Combined systolic and diastolic CHF -at home on Lasix 40 mg daily will hold -when patient's BP tolerates would start on beta blocker weight trend since admission: 107 lbs --> 108 lbs --> 114 --> 117 lbs  - will continue to monitor daily weights, strict intake and output - no evidence of volume overload or distress on exam   HTN -see combined systolic and diastolic CHF  Acute on chronic kidney failure  -baseline Cr ~ 2.1, on admission Cr = 7.3, 11/12 Cr = 3.23 -hold potential offending meds   hypokalemia/hypomagnesemia  -potassium goal>4 -magnesium goal> 2  -continue to monitor aggressively and replete as needed  Secondary hyperparathyroidism/Hypocalcemia -continue calcitriol 0.5 g daily  Hypothyroidism -Synthroid 125 g daily  Crohn's disease -Continue home dose of prednisone 5 mg daily  COPD/Emphysema  -Spiriva daily -Qvar 80 g 2 puffs daily(Flovent substituted for Qvar) -albuterol QID PRN   Right shoulder pain -X-ray right shoulder  Thrombocytopenia - platelet count has stabilized around 120 - follow trend   Severe protein calorie malnutrition - Advance diet as patient able to tolerate   Code Status: FULL Family Communication: family present at time of exam Disposition Plan: resolution of above problems    Consultants: Nephrology consult pending   Procedure/Significant Events: 2/16 echocardiogram;Left ventricle: mildly dilated.-LVEF= 55%. - Aortic valve: Moderate regurgitation. -significantly abnormal diastolicfunction 21/1 CT abdomen pelvis without contrast; space S/P right hemicolectomy.  -No gross inflammatory changes associated with the small bowel or colon to suggest presence of an acute Crohn'sexacerbation. - The pancreas is normal in  appearance  -Numerous  nonobstructive calculi within the collecting systems of kidneys bilaterally, largest of which measures up to 6 mm in upper pole collecting system of the right kidney.  -No ureteral stones or findings of urinary tract obstruction     Culture 11/5 MRSA by PCR negative  Antibiotics: NA  DVT prophylaxis: Full dose anticoagulation with Lovenox   Devices    LINES / TUBES:      Continuous Infusions:    Objective: VITAL SIGNS: Temp: 98.8 F (37.1 C) (11/12 1700) Temp Source: Oral (11/12 1700) BP: 107/64 mmHg (11/12 1700) Pulse Rate: 102 (11/12 1700) SPO2; FIO2:   Intake/Output Summary (Last 24 hours) at 04/06/14 1757 Last data filed at 04/06/14 0933  Gross per 24 hour  Intake    480 ml  Output      0 ml  Net    480 ml     Exam: General: A/O x 4, sitting on the edge of the bed, only complaint she right shoulder pain,negative back/suprapubic pain.,No acute respiratory distress Lungs: Clear to auscultation bilaterally without wheezes or crackles Cardiovascular: Regular rate and rhythm without murmur gallop or rub normal S1 and S2 Abdomen: nontender, nondistended, soft, bowel sounds positive,  Extremities: No significant cyanosis, clubbing, edema on left lower extremities.  Data Reviewed: Basic Metabolic Panel:  Recent Labs Lab 04/02/14 0330 04/03/14 0331 04/04/14 0547 04/05/14 0545 04/06/14 0450  NA 143 144 142 142 141  K 2.6* 2.8* 4.6 4.8 4.9  CL 105 104 105 109 111  CO2 24 27 25 21  16*  GLUCOSE 96 91 83 81 76  BUN 39* 36* 33* 33* 28*  CREATININE 4.62* 4.16* 3.62* 3.44* 3.23*  CALCIUM 6.4* 6.9* 7.5* 8.5 8.2*  MG 1.6 1.3* 2.1 1.7 1.5  PHOS 2.7 2.5 2.7 4.0 3.1   Liver Function Tests:  Recent Labs Lab 04/01/14 0335 04/02/14 0330 04/03/14 0331 04/04/14 0547 04/05/14 0545 04/06/14 0450  AST 14 13  --  13 12 12   ALT 7 7  --  7 7 7   ALKPHOS 123* 110  --  97 88 79  BILITOT 0.4 0.4  --  0.5 0.3 <0.2*  PROT 5.0* 4.8*  --  5.4* 5.4* 5.3*  ALBUMIN  2.4* 2.2* 2.2* 2.2* 2.1* 2.2*    Recent Labs Lab 04/04/14 0547  LIPASE 42   No results for input(s): AMMONIA in the last 168 hours. CBC:  Recent Labs Lab 04/01/14 0335 04/02/14 0330 04/03/14 0331 04/04/14 0547 04/05/14 0545 04/06/14 0450  WBC 5.6 5.7 6.8 6.8 7.3 7.4  NEUTROABS 3.3 3.2 4.0  --  4.6 4.8  HGB 8.6* 8.0* 8.6* 8.7* 8.0* 7.9*  HCT 26.3* 24.1* 26.6* 27.2* 25.5* 26.1*  MCV 94.9 93.4 94.7 97.5 100.0 103.6*  PLT 141* 123* 122* 124* 130* 138*   Cardiac Enzymes: No results for input(s): CKTOTAL, CKMB, CKMBINDEX, TROPONINI in the last 168 hours. BNP (last 3 results) No results for input(s): PROBNP in the last 8760 hours. CBG:  Recent Labs Lab 03/31/14 2002 04/01/14 0014  GLUCAP 106* 107*    Recent Results (from the past 240 hour(s))  MRSA PCR Screening     Status: None   Collection Time: 03/30/14  3:20 PM  Result Value Ref Range Status   MRSA by PCR NEGATIVE NEGATIVE Final    Comment:        The GeneXpert MRSA Assay (FDA approved for NASAL specimens only), is one component of a comprehensive MRSA colonization surveillance program. It is not intended  to diagnose MRSA infection nor to guide or monitor treatment for MRSA infections.   Culture, Urine     Status: None   Collection Time: 03/30/14  7:04 PM  Result Value Ref Range Status   Specimen Description URINE, RANDOM  Final   Special Requests NONE  Final   Culture  Setup Time   Final    03/31/2014 02:30 Performed at [facility]   Final    50,000 COLONIES/ML Performed at [facility]    Culture   Final    Multiple bacterial morphotypes present, none predominant. Suggest appropriate recollection if clinically indicated. Performed at [facility]    Report Status 04/01/2014 FINAL  Final     Studies:  Recent x-ray studies have been reviewed in detail by the Attending Physician  Scheduled Meds:  Scheduled Meds: . calcitRIOL  0.5 mcg Oral Daily  .  darbepoetin (ARANESP) injection - NON-DIALYSIS  100 mcg Subcutaneous Q Wed-1800  . enoxaparin (LOVENOX) injection  50 mg Subcutaneous Q24H  . fluticasone  2 puff Inhalation BID  . folic acid  1 mg Oral Daily  . levothyroxine  125 mcg Oral QAC breakfast  . potassium chloride  20 mEq Oral BID  . predniSONE  5 mg Oral Q breakfast  . sodium bicarbonate  650 mg Oral BID  . Vitamin D (Ergocalciferol)  50,000 Units Oral Once per day on Sun Wed  . warfarin  5 mg Oral ONCE-1800  . Warfarin - Pharmacist Dosing Inpatient   Does not apply q1800    Time spent on care of this patient: 25 mins   [facility], DAWOOD , MD   [facility] [phone] Office  [phone]   On-Call/Text Page:      [email or web address]      password [email or web address]  If 7PM-7AM, please contact night-coverage [email or web address] Password [email or web address] 04/06/2014, 5:57 PM   LOS: 8 days

## 2014-04-07 LAB — COMPREHENSIVE METABOLIC PANEL
ALT: 8 U/L (ref 0–35)
ANION GAP: 14 (ref 5–15)
AST: 13 U/L (ref 0–37)
Albumin: 2.2 g/dL — ABNORMAL LOW (ref 3.5–5.2)
Alkaline Phosphatase: 80 U/L (ref 39–117)
BUN: 24 mg/dL — AB (ref 6–23)
CO2: 15 mEq/L — ABNORMAL LOW (ref 19–32)
Calcium: 8.8 mg/dL (ref 8.4–10.5)
Chloride: 112 mEq/L (ref 96–112)
Creatinine, Ser: 3.06 mg/dL — ABNORMAL HIGH (ref 0.50–1.10)
GFR calc non Af Amer: 15 mL/min — ABNORMAL LOW (ref >90)
GFR, EST AFRICAN AMERICAN: 17 mL/min — AB (ref >90)
GLUCOSE: 72 mg/dL (ref 70–99)
Potassium: 4.8 mEq/L (ref 3.7–5.3)
SODIUM: 141 meq/L (ref 137–147)
TOTAL PROTEIN: 5.5 g/dL — AB (ref 6.0–8.3)
Total Bilirubin: 0.2 mg/dL — ABNORMAL LOW (ref 0.3–1.2)

## 2014-04-07 LAB — CBC WITH DIFFERENTIAL/PLATELET
Basophils Absolute: 0 10*3/uL (ref 0.0–0.1)
Basophils Relative: 0 % (ref 0–1)
EOS ABS: 0.1 10*3/uL (ref 0.0–0.7)
EOS PCT: 1 % (ref 0–5)
HCT: 26.4 % — ABNORMAL LOW (ref 36.0–46.0)
Hemoglobin: 8.1 g/dL — ABNORMAL LOW (ref 12.0–15.0)
LYMPHS ABS: 2 10*3/uL (ref 0.7–4.0)
Lymphocytes Relative: 26 % (ref 12–46)
MCH: 31.2 pg (ref 26.0–34.0)
MCHC: 30.7 g/dL (ref 30.0–36.0)
MCV: 101.5 fL — AB (ref 78.0–100.0)
Monocytes Absolute: 0.7 10*3/uL (ref 0.1–1.0)
Monocytes Relative: 10 % (ref 3–12)
Neutro Abs: 4.7 10*3/uL (ref 1.7–7.7)
Neutrophils Relative %: 63 % (ref 43–77)
PLATELETS: 159 10*3/uL (ref 150–400)
RBC: 2.6 MIL/uL — AB (ref 3.87–5.11)
RDW: 14.5 % (ref 11.5–15.5)
WBC: 7.4 10*3/uL (ref 4.0–10.5)

## 2014-04-07 LAB — MAGNESIUM: Magnesium: 1.5 mg/dL (ref 1.5–2.5)

## 2014-04-07 LAB — PHOSPHORUS: Phosphorus: 3 mg/dL (ref 2.3–4.6)

## 2014-04-07 LAB — PROTIME-INR
INR: 0.99 (ref 0.00–1.49)
PROTHROMBIN TIME: 13.2 s (ref 11.6–15.2)

## 2014-04-07 MED ORDER — COUMADIN BOOK
Freq: Once | Status: AC
Start: 2014-04-07 — End: 2014-04-07
  Administered 2014-04-07: 11:00:00
  Filled 2014-04-07: qty 1

## 2014-04-07 MED ORDER — WARFARIN SODIUM 7.5 MG PO TABS
7.5000 mg | ORAL_TABLET | Freq: Once | ORAL | Status: DC
  Filled 2014-04-07: qty 1

## 2014-04-07 MED ORDER — VITAMIN D 50 MCG (2000 UT) PO TABS: 2000.0000 [IU] | ORAL_TABLET | Freq: Three times a day (TID) | ORAL | Status: DC

## 2014-04-07 MED ORDER — ENOXAPARIN SODIUM 60 MG/0.6ML ~~LOC~~ SOLN: 50.0000 mg | SUBCUTANEOUS | Status: DC

## 2014-04-07 MED ORDER — WARFARIN SODIUM 5 MG PO TABS: 5.0000 mg | ORAL_TABLET | ORAL | Status: DC

## 2014-04-07 MED ORDER — SODIUM BICARBONATE 650 MG PO TABS: 650.0000 mg | ORAL_TABLET | Freq: Two times a day (BID) | ORAL | Status: DC

## 2014-04-07 NOTE — Progress Notes (Addendum)
[place] [place] for Lovenox and warfarin Indication: DVT  Allergies  Allergen Reactions  . Mercaptopurine Other (See Comments)    Unknown reaction  . Remicade [Infliximab] Other (See Comments)    Unknown reaction    Patient Measurements: Height: 5' 4"  (162.6 cm) Weight: 115 lb (52.164 kg) IBW/kg (Calculated) : 54.7   Vital Signs: Temp: 97.7 F (36.5 C) (11/13 0456) Temp Source: Oral (11/13 0456) BP: 102/54 mmHg (11/13 0456) Pulse Rate: 94 (11/13 0456)  Labs:  Recent Labs  04/05/14 0545 04/06/14 0450 04/07/14 0415  HGB 8.0* 7.9* 8.1*  HCT 25.5* 26.1* 26.4*  PLT 130* 138* 159  LABPROT  --   --  13.2  INR  --   --  0.99  CREATININE 3.44* 3.23* 3.06*    Estimated Creatinine Clearance: 14.7 mL/min (by C-G formula based on Cr of 3.06).  Assessment: 60 YOF with AoCKD, current SCr 3.06 with est CrCl ~56m/min. Found to have new DVT in left prox-mid femoral vein, mid popliteal vein, mid peroneal veins and mid posterior tibial veins. With her renal function, the only treatment she qualifies for is warfarin with a Lovenox bridge. Today is D#2 out of minimum of 5 overlap- INR must be therapeutic x2 after 5 days of treatment before Lovenox can be stopped. INR 0.99 this morning- will not see full effects of last night's dose until full 24h after dose given, did not expect much increase this morning.  She does have anemia with Hgb 8.1, plts 159- no overt bleeding noted.   Goal of Therapy:  INR 2-3 Anti-Xa level 0.6-1 units/ml 4hrs after LMWH dose given Monitor platelets by anticoagulation protocol: Yes   Plan:  1. Lovenox 161mkg q24h for CrCl <3061min = 12m67mbQ q24h 2. Warfarin 7.5mg 33mx1 tonight and tomorrow, then 5mg p74maily starting on Sunday. Per Dr. ElgergWaldron Labsg to PCP on Monday to follow INR- can make decision at that point regarding her maintenance dose and whether to stop Lovenox (Monday will be day #5) 3. CBC q72h minimum,  daily INR 4. Follow closely for s/s bleeding  Dare Sanger D. Lashan Macias, PharmD, BCPS Clinical Pharmacist Pager: [phone] 8:31 AM

## 2014-04-07 NOTE — Discharge Summary (Signed)
Natasha [name], 68 y.o., DOB [birth year], MRN [id number]. Admission date: 03/29/2014 Discharge Date 04/07/2014 Primary MD Cloyd Stagers, MD Admitting Physician Shanda Howells, MD  Admission Diagnosis  Hypokalemia [E87.6] Hypomagnesemia [E83.42] Acute renal failure [N17.9]  PCP Please follow: -is recheck labs including CBC/CMP/INR, and give recommendations regarding anticoagulation, potassium supplements.  Discharge Diagnosis   Active Problems:   Metabolic acidosis   Nephrolithiasis   AKI (acute kidney injury)   Thyroid activity decreased   Emphysema of lung   Increased anion gap metabolic acidosis   Generalized abdominal pain   Chronic combined systolic and diastolic CHF (congestive heart failure)   Essential hypertension   Acute on chronic renal failure   Secondary hyperparathyroidism   Other specified hypothyroidism   Right shoulder pain   Thrombocytopenia   Severe protein-calorie malnutrition      Past Medical History  Diagnosis Date  . Chronic kidney disease   . GERD (gastroesophageal reflux disease)   . Crohn's colitis   . CKD (chronic kidney disease) 06/09/2012  . Hypothyroidism 06/10/2012  . Arthritis   . Osteoporosis   . Hypertension   . Plantar warts   . Postmenopausal   . Glaucoma   . Bulging disc     L3-L4  . Atypical chest pain   . Emphysema/COPD     Past Surgical History  Procedure Laterality Date  . Colon resection    . Esophagogastroduodenoscopy  03/04/2012    Procedure: ESOPHAGOGASTRODUODENOSCOPY (EGD);  Surgeon: Garlan Fair, MD;  Location: [facility];  Service: Endoscopy;  Laterality: N/A;  . Flexible sigmoidoscopy  03/04/2012    Procedure: FLEXIBLE SIGMOIDOSCOPY;  Surgeon: Garlan Fair, MD;  Location: [facility];  Service: Endoscopy;  Laterality: N/A;  . Abdominal hysterectomy    . Appendectomy    . Thyroidectomy     Admission history of present illness/brief narrative: 68 y.o. BF PMHx stage 4 CKD, chrons disease  s/p colectomy, COPD, HTN presenting with abd pain, AKI, anion gap metabolic acidosis, hypokalemia, nephrolithiasis. Pt reports abd pain/back pain over the past 1-2 weeks. States that she saw her PCP about back pain 2 weeks ago. Had otherwise negative workup per pt. Was put on vicodin with improvement in sxs. Pt states that she then went on to developed generalized abd pain and nausea. Denies any diarrhea or dysuria. States that she felt that she was becoming progressively dehydrated over course of abd pain. Tried to take in oral fluids, but feels like she was unsuccessful. Followed by Dr Mercy Moore outpt for CKD. Has prn lasix she takes for edema. Denies taking this within last 1-2 weeks. Saw her PCP because of sxs. PCP ordered CT abd pel w/o contrast and bloodwork. CT showed no acute findings concerning for colitis of pancreatitis. Did show multiple bilateral non obstructing kidney stones. BMET showed Cr 5.94 (baselin around 2.2), Bicarb 8, AG 21, BUN 60, K 2.9, Mg 1.0 Presented to ER afebrile, HR 100s, resp 10s, BP 110s-130s, Satting 98% on RA. CXR shows underlying emphysema w/o acute findings. Patient was admitted for treatment of acute renal failure,hyperchloremic metabolic acidosis.Patient was started on aggressive IV fluid hydration, her Lasix was held, and her creatinine continues to improve, and is free on day of discharge, and her anion gap closed, was started on oral bicarbonate. Patient was noticed to have left lower extremity edema, venous Doppler showed acute DVT, patient was started on warfarin, being bridged with Lovenox.  Hospital Course See H&P, Labs, Consult and Test reports for all details in  brief, patient was admitted for   Active Problems:   Metabolic acidosis   Nephrolithiasis   AKI (acute kidney injury)   Thyroid activity decreased   Emphysema of lung   Increased anion gap metabolic acidosis   Generalized abdominal pain   Chronic combined systolic and diastolic CHF (congestive  heart failure)   Essential hypertension   Acute on chronic renal failure   Secondary hyperparathyroidism   Other specified hypothyroidism   Right shoulder pain   Thrombocytopenia   Severe protein-calorie malnutrition  Anion gap Metabolic acidosis-hyperchloremic -etiology felt to be hypovolemia, creatinine slowly improving, her Lasix has been held during hospitalization, will continue to hold on discharge, patient will follow PCP on Monday, will have repeat CMP done then. - will be discharged on oral bicarbonate  Acute DVT in left lower extremity -Patient was started on warfarin, being bridged with Lovenox, unfortunately cannot be started on newer anticoagulation agent giving her poor renal function. -patient will be discharged on warfarin 7.5 mg oral for 11/13 and 11/14, and 5 mg oral on 11/15, she will follow with her PCP this coming Monday 11/16 to check INR level and dose to be adjusted by PCP, meanwhile she is being bridged with Lovenox 1 mg/Kg every 24 hours( 50 mg subcutaneous daily),  till she is seen by her PCP on Monday.  Abd pain/nephrolithiasis  -most likely multifactorial to include hypocalcemia, hypomagnesemia, nephrolithiasis- stone burden,  -otherwise non acute CT  -resolved   Combined systolic and diastolic CHF -initially patient appears to be hypothalamic, he is euvolemic on discharge.    Acute on chronic kidney failure  -baseline Cr ~ 2.1, on admission Cr = 7.3, creatinine is 3 at day of discharge. -hold potential offending meds   hypokalemia/hypomagnesemia  -were repleted and corrected,patient potassium is 4.8 , so we'll hold potassium supplement on discharge, which can be resumed by PCP if her labs showing recurrent hypokalemia.   Secondary hyperparathyroidism/Hypocalcemia -continue calcitriol 0.5 g daily  Hypothyroidism -Synthroid 125 g daily  Crohn's disease -Continue home dose of prednisone 5 mg daily  COPD/Emphysema  -the main stable  during hospitalization, continue with Spiriva daily, Qvar, and as needed albuterol   Right shoulder pain -X-ray right shoulder was done did not show any acute findings.  Thrombocytopenia - stable  Anemia -Remains stable during hospitalization with hemoglobin level around 8, patient fecal occult blood was negative on 04/05/14, iron panel shows anemia of chronic disease/CTD, patient is started on agonist by nephrology while inpatient, so she needs to follow with nephrology as an outpatient if she needs to be continued on that.  Consults   nephrology  Significant Tests:  See full reports for all details    Ct Abdomen Pelvis Wo Contrast  03/29/2014   CLINICAL DATA:  68 year old female with generalized abdominal pain, nausea, vomiting and diarrhea for 1 day. 16-17 lb weight loss since June 2015. History of Crohn's disease. Evaluate for possible pancreatitis.  EXAM: CT ABDOMEN AND PELVIS WITHOUT CONTRAST  TECHNIQUE: Multidetector CT imaging of the abdomen and pelvis was performed following the standard protocol without IV contrast.  COMPARISON:  05/16/2013.  FINDINGS: Lower chest:  Unremarkable.  Hepatobiliary: Status post cholecystectomy. No definite focal cystic or solid hepatic lesions on today's non contrast CT examination.  Pancreas: Unremarkable.  Spleen: Unremarkable.  Adrenals/Urinary Tract: Normal appearance of the adrenal glands bilaterally. Multiple nonobstructive calculi are present within the collecting systems of the kidneys bilaterally, largest of which measures up to 6 mm in the upper pole  collecting system of the right kidney. No additional calculi are noted along the course of either ureter or within the lumen of the urinary bladder. No hydroureteronephrosis or perinephric stranding to indicate urinary tract obstruction at this time. Urinary bladder is normal in appearance. Several small low-attenuation lesions are noted in the kidneys bilaterally, the largest of which is exophytic  measuring 1.7 x 1.1 cm in the lateral aspect of the lower pole of the left kidney. Although these are incompletely characterized on today's non contrast CT examination, they are similar to the prior examination, and therefore favored to represent cysts.  Stomach/Bowel: Small diverticulum from the cardia of the stomach. No pathologic dilatation of small bowel or colon. Status post right hemicolectomy. The small bowel is generally unremarkable in appearance. Specifically, no gross inflammatory changes associated with small bowel or small bowel mesenteric noted at this time.  Vascular/Lymphatic: Atherosclerosis throughout the abdominal and pelvic vasculature, without evidence of aneurysm. No pathologically enlarged lymph nodes are noted in the abdomen or pelvis on today's non contrast CT examination.  Reproductive: Status post hysterectomy. Ovaries are not confidently identified and may be surgically absent or atrophic.  Other: No significant volume of ascites.  No pneumoperitoneum.  Musculoskeletal: There are no aggressive appearing lytic or blastic lesions noted in the visualized portions of the skeleton.  IMPRESSION: 1. Status post right hemicolectomy. No gross inflammatory changes associated with the small bowel or colon on today's noncontrast CT examination to strongly suggest presence of an acute Crohn's exacerbation. 2. The pancreas is normal in appearance on today's noncontrast examination. 3. Numerous nonobstructive calculi within the collecting systems of the kidneys bilaterally, largest of which measures up to 6 mm in the upper pole collecting system of the right kidney. No ureteral stones or findings of urinary tract obstruction are noted at this time. 4. Additional incidental findings, as above.   Electronically Signed   By: Vinnie Langton M.D.   On: 03/29/2014 16:56   Dg Chest 2 View  03/29/2014   CLINICAL DATA:  Acute renal failure with weakness  EXAM: CHEST  2 VIEW  COMPARISON:  July 06, 2013   FINDINGS: There is underlying emphysematous change. There is no edema or consolidation. Heart is upper normal in size with pulmonary vascularity within normal limits. No adenopathy. There are surgical clips in the region of the thyroid. No bone lesions.  IMPRESSION: Underlying emphysematous change.  No edema or consolidation.   Electronically Signed   By: Lowella Grip M.D.   On: 03/29/2014 21:41   Dg Lumbar Spine Complete  03/21/2014   CLINICAL DATA:  Fall 2 weeks ago.  Low back pain.  EXAM: LUMBAR SPINE - COMPLETE 4+ VIEW  COMPARISON:  None.  FINDINGS: Aortoiliac atherosclerotic vascular calcification. Surgical clips and sutures are noted in the abdomen. Diffuse degenerative changes noted of the lumbar spine. Minimal lumbar spine impressions are stable. No acute compression fracture .  IMPRESSION: No acute abnormality.  Diffuse degenerative change.   Electronically Signed   By: Marcello Moores  Register   On: 03/21/2014 15:49   Dg Shoulder Right  04/04/2014   CLINICAL DATA:  Right anterior shoulder pain since Thursday  EXAM: RIGHT SHOULDER - 2+ VIEW  COMPARISON:  None.  FINDINGS: There is no evidence of fracture or dislocation. There is no evidence of arthropathy or other focal bone abnormality. Soft tissues are unremarkable.  IMPRESSION: Negative.   Electronically Signed   By: Kerby Moors M.D.   On: 04/04/2014 13:59  Today   Subjective:   Natasha [name] today has no headache,no chest abdominal pain,no new weakness tingling or numbness, feels much better wants to go home today.   Objective:   Blood pressure 102/54, pulse 94, temperature 97.7 F (36.5 C), temperature source Oral, resp. rate 16, height 5' 4"  (1.626 m), weight 52.164 kg (115 lb), SpO2 100 %.  Intake/Output Summary (Last 24 hours) at 04/07/14 0951 Last data filed at 04/07/14 0904  Gross per 24 hour  Intake    480 ml  Output      0 ml  Net    480 ml    Exam Awake Alert, Oriented *3, No new F.N deficits, Normal  affect [place].AT,PERRAL Supple Neck,No JVD, No cervical lymphadenopathy appriciated.  Symmetrical Chest wall movement, Good air movement bilaterally, CTAB RRR,No Gallops,Rubs or new Murmurs, No Parasternal Heave +ve B.Sounds, Abd Soft, Non tender, No organomegaly appriciated, No rebound -guarding or rigidity. No Cyanosis, Clubbing , No new Rash or bruise, left lower extremity edema much improved.  Data Review     CBC w Diff: Lab Results  Component Value Date   WBC 7.4 04/07/2014   HGB 8.1* 04/07/2014   HCT 26.4* 04/07/2014   PLT 159 04/07/2014   LYMPHOPCT 26 04/07/2014   MONOPCT 10 04/07/2014   EOSPCT 1 04/07/2014   BASOPCT 0 04/07/2014   CMP: Lab Results  Component Value Date   NA 141 04/07/2014   K 4.8 04/07/2014   CL 112 04/07/2014   CO2 15* 04/07/2014   BUN 24* 04/07/2014   CREATININE 3.06* 04/07/2014   PROT 5.5* 04/07/2014   PROT 6.6 09/28/2013   ALBUMIN 2.2* 04/07/2014   BILITOT 0.2* 04/07/2014   ALKPHOS 80 04/07/2014   AST 13 04/07/2014   ALT 8 04/07/2014  .  Micro Results Recent Results (from the past 240 hour(s))  MRSA PCR Screening     Status: None   Collection Time: 03/30/14  3:20 PM  Result Value Ref Range Status   MRSA by PCR NEGATIVE NEGATIVE Final    Comment:        The GeneXpert MRSA Assay (FDA approved for NASAL specimens only), is one component of a comprehensive MRSA colonization surveillance program. It is not intended to diagnose MRSA infection nor to guide or monitor treatment for MRSA infections.   Culture, Urine     Status: None   Collection Time: 03/30/14  7:04 PM  Result Value Ref Range Status   Specimen Description URINE, RANDOM  Final   Special Requests NONE  Final   Culture  Setup Time   Final    03/31/2014 02:30 Performed at [facility]   Final    50,000 COLONIES/ML Performed at [facility]    Culture   Final    Multiple bacterial morphotypes present, none predominant. Suggest  appropriate recollection if clinically indicated. Performed at [facility]    Report Status 04/01/2014 FINAL  Final     Discharge Instructions     Please follow with PCP this Monday to have repeat blood work, INR level monitored, and the recommendations regarding further anticoagulation. Follow-up Information    Follow up with [facility], IBTEHAL, MD In 3 days.   Specialty:  Internal Medicine   Why:  to follow your INR level, and to give recommendations regarding further anticoagulation. the office will call you with the appointment.   Contact information:   [facility]  [place]  [address] [phone]       Discharge Medications     Medication List    STOP taking these medications        fluticasone 50 MCG/ACT nasal spray  Commonly known as:  FLONASE     furosemide 40 MG tablet  Commonly known as:  LASIX     HYDROcodone-acetaminophen 5-325 MG per tablet  Commonly known as:  NORCO/VICODIN     potassium chloride 20 MEQ packet  Commonly known as:  KLOR-CON     promethazine 25 MG tablet  Commonly known as:  PHENERGAN      TAKE these medications        acetaminophen 500 MG tablet  Commonly known as:  TYLENOL  Take 500 mg by mouth every 6 (six) hours as needed. For severe pain     albuterol 108 (90 BASE) MCG/ACT inhaler  Commonly known as:  PROVENTIL HFA;VENTOLIN HFA  Inhale 2 puffs into the lungs every 6 (six) hours as needed for wheezing or shortness of breath.     calcitRIOL 0.5 MCG capsule  Commonly known as:  ROCALTROL  Take 0.5 mcg by mouth daily.     cyanocobalamin 1000 MCG/ML injection  Commonly known as:  (VITAMIN B-12)  Inject 1,000 mcg into the muscle every 30 (thirty) days. Last injection was 04/28/12     cyclobenzaprine 10 MG tablet  Commonly known as:  FLEXERIL  Take 10 mg by mouth as needed for muscle spasms.     enoxaparin 60 MG/0.6ML injection  Commonly known as:  LOVENOX  Inject 0.5 mLs (50 mg total)  into the skin daily.  Start taking on:  79/06/4095     folic acid 1 MG tablet  Commonly known as:  FOLVITE  Take 1 mg by mouth daily.     levothyroxine 125 MCG tablet  Commonly known as:  SYNTHROID, LEVOTHROID  Take 125 mcg by mouth daily.     predniSONE 5 MG tablet  Commonly known as:  DELTASONE  Take 5 mg by mouth daily with breakfast.     QVAR 80 MCG/ACT inhaler  Generic drug:  beclomethasone  Inhale 2 puffs into the lungs as needed (for wheezing).     sodium bicarbonate 650 MG tablet  Take 1 tablet (650 mg total) by mouth 2 (two) times daily.     SPIRIVA HANDIHALER 18 MCG inhalation capsule  Generic drug:  tiotropium  Place 18 mcg into inhaler and inhale as needed (for COPD).     Vitamin D 2000 UNITS tablet  Take 1 tablet (2,000 Units total) by mouth 3 (three) times daily.     warfarin 5 MG tablet  Commonly known as:  COUMADIN  - Take 1 tablet (5 mg total) by mouth See admin instructions. Please    - -take One  and half tablet (7.5 mg) on friday 11/13 at 6 PM,   - - take One  and half tablet (7.5 mg) on Saturday 11/14 at 6 PM,   - - take One tablet (5 mg) on Sunday   11/15 at 6 PM,   - - he will follow with your PCP on Monday morning regarding your INR level, and your PCP will follow and dose warfarin.         Total Time in preparing paper work, data evaluation and todays exam - 35 minutes  Natasha [name] M.D on 04/07/2014 at 9:51 AM  [facility]  [phone]

## 2014-04-07 NOTE — Progress Notes (Signed)
Patient Discharge: Disposition: Patient discharged home hemodynamically stable. Education: Patient educated on Coumadin medicine and also on Lovenox administration, given hand outs on both medications and also how to administer Lovenox.  Coumadin book is also given to patient.  She was also education on discharge instruction, follow-up appointments, prescriptions. IV: Discontinued before discharge. Telemetry: Discontinued before discharge.  CCMD notified. Transportation: Patient transported in w/c with the staff and husband accompanying her. Belongings: Patient took all her belongings with her.

## 2014-04-07 NOTE — Discharge Instructions (Signed)
Follow with Primary MD Nena Jordan, IBTEHAL, MD in 3 days (Drs. Amedeo Kinsman office and schedule an appointment for Monday morning, follow on your INR level, recheck labs, and give recommendations regarding anticoagulation)  Get CBC, CMP,INR  by Primary MD next visit.    Activity: As tolerated with Full fall precautions use walker/cane & assistance as needed   Disposition Home    Diet: Heart Healthy  , with feeding assistance and aspiration precautions as needed.  For Heart failure patients - Check your Weight same time everyday, if you gain over 2 pounds, or you develop in leg swelling, experience more shortness of breath or chest pain, call your Primary MD immediately. Follow Cardiac Low Salt Diet and 1.8 lit/day fluid restriction.   On your next visit with your primary care physician please Get Medicines reviewed and adjusted.   Please request your Prim.MD to go over all Hospital Tests and Procedure/Radiological results at the follow up, please get all Hospital records sent to your Prim MD by signing hospital release before you go home.   If you experience worsening of your admission symptoms, develop shortness of breath, life threatening emergency, suicidal or homicidal thoughts you must seek medical attention immediately by calling 911 or calling your MD immediately  if symptoms less severe.  You Must read complete instructions/literature along with all the possible adverse reactions/side effects for all the Medicines you take and that have been prescribed to you. Take any new Medicines after you have completely understood and accpet all the possible adverse reactions/side effects.   Do not drive, operating heavy machinery, perform activities at heights, swimming or participation in water activities or provide baby sitting services if your were admitted for syncope or siezures until you have seen by Primary MD or a Neurologist and advised to do so again.  Do not drive when taking  Pain medications.    Do not take more than prescribed Pain, Sleep and Anxiety Medications  Special Instructions: If you have smoked or chewed Tobacco  in the last 2 yrs please stop smoking, stop any regular Alcohol  and or any Recreational drug use.  Wear Seat belts while driving.   Please note  You were cared for by a hospitalist during your hospital stay. If you have any questions about your discharge medications or the care you received while you were in the hospital after you are discharged, you can call the unit and asked to speak with the hospitalist on call if the hospitalist that took care of you is not available. Once you are discharged, your primary care physician will handle any further medical issues. Please note that NO REFILLS for any discharge medications will be authorized once you are discharged, as it is imperative that you return to your primary care physician (or establish a relationship with a primary care physician if you do not have one) for your aftercare needs so that they can reassess your need for medications and monitor your lab values.     Information on my medicine - Coumadin   (Warfarin)  This medication education was reviewed with me or my healthcare representative as part of my discharge preparation.  The pharmacist that spoke with me during my hospital stay was:  Blakely Gluth, [place]  Why was Coumadin prescribed for you? Coumadin was prescribed for you because you have a blood clot or a medical condition that can cause an increased risk of forming blood clots. Blood clots can cause serious health problems by blocking the flow  of blood to the heart, lung, or brain. Coumadin can prevent harmful blood clots from forming. As a reminder your indication for Coumadin is:   Deep Vein Thrombosis Treatment  What test will check on my response to Coumadin? While on Coumadin (warfarin) you will need to have an INR test regularly to ensure that your dose is keeping you  in the desired range. The INR (international normalized ratio) number is calculated from the result of the laboratory test called prothrombin time (PT).  If an INR APPOINTMENT HAS NOT ALREADY BEEN MADE FOR YOU please schedule an appointment to have this lab work done by your health care provider within 7 days. Your INR goal is usually a number between:  2 to 3 or your provider may give you a more narrow range like 2-2.5.  Ask your health care provider during an office visit what your goal INR is.  What  do you need to  know  About  COUMADIN? Take Coumadin (warfarin) exactly as prescribed by your healthcare provider about the same time each day.  DO NOT stop taking without talking to the doctor who prescribed the medication.  Stopping without other blood clot prevention medication to take the place of Coumadin may increase your risk of developing a new clot or stroke.  Get refills before you run out.  What do you do if you miss a dose? If you miss a dose, take it as soon as you remember on the same day then continue your regularly scheduled regimen the next day.  Do not take two doses of Coumadin at the same time.  Important Safety Information A possible side effect of Coumadin (Warfarin) is an increased risk of bleeding. You should call your healthcare provider right away if you experience any of the following: ? Bleeding from an injury or your nose that does not stop. ? Unusual colored urine (red or dark brown) or unusual colored stools (red or black). ? Unusual bruising for unknown reasons. ? A serious fall or if you hit your head (even if there is no bleeding).  Some foods or medicines interact with Coumadin (warfarin) and might alter your response to warfarin. To help avoid this: ? Eat a balanced diet, maintaining a consistent amount of Vitamin K. ? Notify your provider about major diet changes you plan to make. ? Avoid alcohol or limit your intake to 1 drink for women and 2 drinks for men  per day. (1 drink is 5 oz. wine, 12 oz. beer, or 1.5 oz. liquor.)  Make sure that ANY health care provider who prescribes medication for you knows that you are taking Coumadin (warfarin).  Also make sure the healthcare provider who is monitoring your Coumadin knows when you have started a new medication including herbals and non-prescription products.  Coumadin (Warfarin)  Major Drug Interactions  Increased Warfarin Effect Decreased Warfarin Effect  Alcohol (large quantities) Antibiotics (esp. Septra/Bactrim, Flagyl, Cipro) Amiodarone (Cordarone) Aspirin (ASA) Cimetidine (Tagamet) Megestrol (Megace) NSAIDs (ibuprofen, naproxen, etc.) Piroxicam (Feldene) Propafenone (Rythmol SR) Propranolol (Inderal) Isoniazid (INH) Posaconazole (Noxafil) Barbiturates (Phenobarbital) Carbamazepine (Tegretol) Chlordiazepoxide (Librium) Cholestyramine (Questran) Griseofulvin Oral Contraceptives Rifampin Sucralfate (Carafate) Vitamin K   Coumadin (Warfarin) Major Herbal Interactions  Increased Warfarin Effect Decreased Warfarin Effect  Garlic Ginseng Ginkgo biloba Coenzyme Q10 Green tea St. Johns wort    Coumadin (Warfarin) FOOD Interactions  Eat a consistent number of servings per week of foods HIGH in Vitamin K (1 serving =  cup)  Collards (cooked, or boiled &  drained) Kale (cooked, or boiled & drained) Mustard greens (cooked, or boiled & drained) Parsley *serving size only =  cup Spinach (cooked, or boiled & drained) Swiss chard (cooked, or boiled & drained) Turnip greens (cooked, or boiled & drained)  Eat a consistent number of servings per week of foods MEDIUM-HIGH in Vitamin K (1 serving = 1 cup)  Asparagus (cooked, or boiled & drained) Broccoli (cooked, boiled & drained, or raw & chopped) Brussel sprouts (cooked, or boiled & drained) *serving size only =  cup Lettuce, raw (green leaf, endive, romaine) Spinach, raw Turnip greens, raw & chopped   These websites have more  information on Coumadin (warfarin):  FailFactory.se; VeganReport.com.au;

## 2014-04-07 NOTE — Plan of Care (Signed)
Problem: Consults Goal: General Medical Patient Education See Patient Education Module for specific education.  Outcome: Completed/Met Date Met:  04/07/14 Goal: Nutrition Consult-if indicated Outcome: Not Applicable Date Met:  11/46/43  Problem: Phase II Progression Outcomes Goal: Discharge plan established Outcome: Completed/Met Date Met:  04/07/14  Problem: Phase III Progression Outcomes Goal: Pain controlled on oral analgesia Outcome: Completed/Met Date Met:  04/07/14 Goal: Activity at appropriate level-compared to baseline (UP IN CHAIR FOR HEMODIALYSIS)  Outcome: Completed/Met Date Met:  04/07/14 Goal: Voiding independently Outcome: Completed/Met Date Met:  04/07/14 Goal: IV/normal saline lock discontinued Outcome: Completed/Met Date Met:  04/07/14 Goal: Discharge plan remains appropriate-arrangements made Outcome: Completed/Met Date Met:  04/07/14 Goal: Other Phase III Outcomes/Goals Outcome: Not Applicable Date Met:  14/27/67  Problem: Discharge Progression Outcomes Goal: Discharge plan in place and appropriate Outcome: Completed/Met Date Met:  04/07/14 Goal: Pain controlled with appropriate interventions Outcome: Completed/Met Date Met:  04/07/14 Goal: Hemodynamically stable Outcome: Completed/Met Date Met:  06/04/01 Goal: Complications resolved/controlled Outcome: Completed/Met Date Met:  04/07/14 Goal: Tolerating diet Outcome: Completed/Met Date Met:  04/07/14 Goal: Activity appropriate for discharge plan Outcome: Completed/Met Date Met:  04/07/14 Goal: Other Discharge Outcomes/Goals Outcome: Not Applicable Date Met:  49/61/16

## 2014-04-07 NOTE — Care Management Note (Signed)
CARE MANAGEMENT NOTE 04/07/2014  Patient:  Natasha [name], Natasha [name]   Account Number:  [id number]  Date Initiated:  03/31/2014  Documentation initiated by:  MAYO,HENRIETTA  Subjective/Objective Assessment:   dx metabolic acidosis 2/2 nephrolithiasis  lives with spouse    PCP  Martin Majestic     Action/Plan:   Pt to d/c to home with [facility] services from [facility].   Anticipated DC Date:  04/07/2014   Anticipated DC Plan:  [place]  CM consult      PAC Choice  [place]   Choice offered to / List presented to:  C-1 Patient   DME arranged  Vassie Moselle      DME agency  [place] arranged  [place].   Status of service:  Completed, signed off Medicare Important Message given?  YES (If response is "NO", the following Medicare IM given date fields will be blank) Date Medicare IM given:  04/04/2014 Medicare IM given by:  MAYO,HENRIETTA Date Additional Medicare IM given:  04/07/2014 Additional Medicare IM given by:  [facility]  Discharge Disposition:  [place]  Per UR Regulation:  Reviewed for med. necessity/level of care/duration of stay  If discussed at [place] of Stay Meetings, dates discussed:   04/04/2014    Comments:  04/04/14 [place] MSN BSN CCM PT recommends home therapy and rolling walker.  Provided list of agencies and pt chose [facility], stated she had therapy with them previously.  Provided copay cost for walker @ her request.

## 2014-04-12 ENCOUNTER — Encounter: Payer: Self-pay | Admitting: Neurology

## 2014-04-17 ENCOUNTER — Other Ambulatory Visit: Payer: Self-pay | Admitting: *Deleted

## 2014-04-17 DIAGNOSIS — Z01810 Encounter for preprocedural cardiovascular examination: Secondary | ICD-10-CM

## 2014-04-17 DIAGNOSIS — N184 Chronic kidney disease, stage 4 (severe): Secondary | ICD-10-CM

## 2014-04-18 ENCOUNTER — Encounter: Payer: Self-pay | Admitting: Neurology

## 2014-04-24 ENCOUNTER — Ambulatory Visit (HOSPITAL_COMMUNITY): Admission: RE | Admit: 2014-04-24 | Payer: Medicare Other | Source: Ambulatory Visit | Admitting: Gastroenterology

## 2014-04-24 ENCOUNTER — Telehealth: Payer: Self-pay | Admitting: Adult Health

## 2014-04-24 ENCOUNTER — Encounter (HOSPITAL_COMMUNITY): Admission: RE | Payer: Self-pay | Source: Ambulatory Visit

## 2014-04-24 DIAGNOSIS — E559 Vitamin D deficiency, unspecified: Secondary | ICD-10-CM

## 2014-04-24 SURGERY — COLONOSCOPY WITH PROPOFOL
Anesthesia: Monitor Anesthesia Care

## 2014-04-24 NOTE — Telephone Encounter (Signed)
I called the patient. I would like her to come in to have her vitamin D level rechecked. I have placed the order. She can come in at her convenience. Patient verbalized understanding.

## 2014-05-04 ENCOUNTER — Encounter: Payer: Self-pay | Admitting: Vascular Surgery

## 2014-05-05 ENCOUNTER — Ambulatory Visit (INDEPENDENT_AMBULATORY_CARE_PROVIDER_SITE_OTHER): Payer: Medicare Other | Admitting: Vascular Surgery

## 2014-05-05 ENCOUNTER — Ambulatory Visit (INDEPENDENT_AMBULATORY_CARE_PROVIDER_SITE_OTHER)
Admission: RE | Admit: 2014-05-05 | Discharge: 2014-05-05 | Disposition: A | Discharge status: Home / Self Care | Payer: Medicare Other | Source: Ambulatory Visit | Attending: Vascular Surgery | Admitting: Vascular Surgery

## 2014-05-05 ENCOUNTER — Ambulatory Visit (HOSPITAL_COMMUNITY)
Admission: RE | Admit: 2014-05-05 | Discharge: 2014-05-05 | Disposition: A | Discharge status: Home / Self Care | Payer: Medicare Other | Source: Ambulatory Visit | Attending: Vascular Surgery | Admitting: Vascular Surgery

## 2014-05-05 ENCOUNTER — Encounter: Payer: Self-pay | Admitting: Vascular Surgery

## 2014-05-05 VITALS — BP 122/76 | HR 83 | Ht 64.0 in | Wt 109.0 lb

## 2014-05-05 DIAGNOSIS — Z01810 Encounter for preprocedural cardiovascular examination: Secondary | ICD-10-CM

## 2014-05-05 DIAGNOSIS — N184 Chronic kidney disease, stage 4 (severe): Secondary | ICD-10-CM | POA: Diagnosis not present

## 2014-05-05 NOTE — Progress Notes (Signed)
Referred by:  Cloyd Stagers, MD [place]  [address], [place] [address]  Reason for referral: New access  History of Present Illness  Natasha [name] is a 68 y.o. (February 22, 1946) female who presents for evaluation for permanent access.  The patient is right hand dominant.  The patient has not had previous access procedures.  Previous central venous cannulation procedures include: none.  The patient has never had a PPM placed.   Past Medical History  Diagnosis Date  . Chronic kidney disease   . GERD (gastroesophageal reflux disease)   . Crohn's colitis   . CKD (chronic kidney disease) 06/09/2012  . Hypothyroidism 06/10/2012  . Arthritis   . Osteoporosis   . Hypertension   . Plantar warts   . Postmenopausal   . Glaucoma   . Bulging disc     L3-L4  . Atypical chest pain   . Emphysema/COPD   . Anemia     Past Surgical History  Procedure Laterality Date  . Colon resection    . Esophagogastroduodenoscopy  03/04/2012    Procedure: ESOPHAGOGASTRODUODENOSCOPY (EGD);  Surgeon: Garlan Fair, MD;  Location: [facility];  Service: Endoscopy;  Laterality: N/A;  . Flexible sigmoidoscopy  03/04/2012    Procedure: FLEXIBLE SIGMOIDOSCOPY;  Surgeon: Garlan Fair, MD;  Location: [facility];  Service: Endoscopy;  Laterality: N/A;  . Abdominal hysterectomy    . Appendectomy    . Thyroidectomy      History   Social History  . Marital Status: Married    Spouse Name: [name]    Number of Children: 3  . Years of Education: 12   Occupational History  . Retired    Social History Main Topics  . Smoking status: Former Smoker -- 0.25 packs/day for 15 years    Types: Cigarettes  . Smokeless tobacco: Never Used     Comment: Quit smoking in 2003  . Alcohol Use: 1.2 oz/week    2 Glasses of wine per week     Comment: 1-2 drinks monthly  . Drug Use: No  . Sexual Activity: No   Other Topics Concern  . Not on file   Social History Narrative   Patient  is married [name]) and lives at home with her husband.   Patient has three children.   Patient is retired on disability.   Patient drinks two cups of caffeine daily.   Patient is right-handed.             Family History  Problem Relation Age of Onset  . Hypertension Father   . Heart disease Father   . Heart attack Father   . Diverticulitis Mother   . COPD Mother   . Hypertension Mother   . Heart disease Mother   . Heart attack Mother   . Colon cancer Brother   . Diabetes Brother   . Thyroid disease Brother     Current Outpatient Prescriptions on File Prior to Visit  Medication Sig Dispense Refill  . acetaminophen (TYLENOL) 500 MG tablet Take 500 mg by mouth every 6 (six) hours as needed. For severe pain    . albuterol (PROVENTIL HFA;VENTOLIN HFA) 108 (90 BASE) MCG/ACT inhaler Inhale 2 puffs into the lungs every 6 (six) hours as needed for wheezing or shortness of breath.    . calcitRIOL (ROCALTROL) 0.5 MCG capsule Take 0.5 mcg by mouth daily.    . Cholecalciferol (VITAMIN D) 2000 UNITS tablet Take 1 tablet (2,000 Units total) by  mouth 3 (three) times daily. 60 tablet 0  . cyanocobalamin (,VITAMIN B-12,) 1000 MCG/ML injection Inject 1,000 mcg into the muscle every 30 (thirty) days. Last injection was 04/28/12    . cyclobenzaprine (FLEXERIL) 10 MG tablet Take 10 mg by mouth as needed for muscle spasms.     . folic acid (FOLVITE) 1 MG tablet Take 1 mg by mouth daily.    [name] levothyroxine (SYNTHROID, LEVOTHROID) 125 MCG tablet Take 125 mcg by mouth daily.    . predniSONE (DELTASONE) 5 MG tablet Take 5 mg by mouth daily with breakfast.    . QVAR 80 MCG/ACT inhaler Inhale 2 puffs into the lungs as needed (for wheezing).     . sodium bicarbonate 650 MG tablet Take 1 tablet (650 mg total) by mouth 2 (two) times daily. 60 tablet 0  . SPIRIVA HANDIHALER 18 MCG inhalation capsule Place 18 mcg into inhaler and inhale as needed (for COPD).     [name] warfarin (COUMADIN) 5 MG tablet Take 1 tablet  (5 mg total) by mouth See admin instructions. Please   -take One  and half tablet (7.5 mg) on friday 11/13 at 6 PM,  - take One  and half tablet (7.5 mg) on Saturday 11/14 at 6 PM,  - take One tablet (5 mg) on Sunday   11/15 at 6 PM,  - he will follow with your PCP on Monday morning regarding your INR level, and your PCP will follow and dose warfarin. 6 tablet 0  . enoxaparin (LOVENOX) 60 MG/0.6ML injection Inject 0.5 mLs (50 mg total) into the skin daily. 3 Syringe 0   No current facility-administered medications on file prior to visit.    Allergies  Allergen Reactions  . Mercaptopurine Other (See Comments)    Unknown reaction  . Remicade [Infliximab] Other (See Comments)    Unknown reaction     REVIEW OF SYSTEMS:  (Positives checked otherwise negative)  CARDIOVASCULAR:  []  chest pain, []  chest pressure, []  palpitations, []  shortness of breath when laying flat, []  shortness of breath with exertion,  [x]  pain in feet when walking, []  pain in feet when laying flat, [x]  history of blood clot in veins (DVT), []  history of phlebitis, [x]  swelling in legs, []  varicose veins  PULMONARY:  []  productive cough, []  asthma, []  wheezing  NEUROLOGIC:  [x]  weakness in arms or legs, []  numbness in arms or legs, []  difficulty speaking or slurred speech, []  temporary loss of vision in one eye, []  dizziness  HEMATOLOGIC:  []  bleeding problems, []  problems with blood clotting too easily  MUSCULOSKEL:  []  joint pain, []  joint swelling  GASTROINTEST:  []  vomiting blood, []  blood in stool     GENITOURINARY:  []  burning with urination, []  blood in urine  PSYCHIATRIC:  []  history of major depression  INTEGUMENTARY:  []  rashes, []  ulcers  CONSTITUTIONAL:  []  fever, []  chills  Physical Examination  Filed Vitals:   05/05/14 1112  BP: 122/76  Pulse: 83  Height: 5' 4"  (1.626 m)  Weight: 109 lb (49.442 kg)  SpO2: 100%   Body mass index is 18.7 kg/(m^2).  General: A&O x 3, WD, thin  Head:  [place]/AT  Ear/Nose/Throat: Hearing grossly intact, nares w/o erythema or drainage, oropharynx w/o Erythema/Exudate, Mallampati score: 2   Eyes: PERRLA, EOMI  Neck: Supple, no nuchal rigidity, no palpable LAD  Pulmonary: Sym exp, good air movt, CTAB, no rales, rhonchi, & wheezing  Cardiac: RRR, Nl S1, S2, no Murmurs, rubs or gallops  Vascular: Vessel Right Left  Radial Faintly palpable Faintly Palpable  Ulnar Not Palpable Palpable  Brachial Faintly Palpable Faintly Palpable  Carotid Palpable, without bruit Palpable, without bruit  Aorta Not palpable N/A  Femoral Palpable Palpable  Popliteal Not palpable Not palpable  PT Not Palpable Not Palpable  DP Faintly Palpable Faintly Palpable   Gastrointestinal: soft, NTND, -G/R, - HSM, - masses, - CVAT B  Musculoskeletal: M/S 5/5 throughout , Extremities without ischemic changes   Neurologic: CN 2-12 intact , Pain and light touch intact in extremities , Motor exam as listed above  Psychiatric: Judgment intact, Mood & affect appropriate for pt's clinical situation  Dermatologic: See M/S exam for extremity exam, no rashes otherwise noted  Lymph : No Cervical, Axillary, or Inguinal lymphadenopathy   Non-Invasive Vascular Imaging  Vein Mapping  (Date: 05/05/2014):   R arm: acceptable vein conduits include none (on repeat imaging, 3 mm basilic was visualized)  L arm: acceptable vein conduits include none  BUE Doppler (Date: 05/05/2014):   R arm:   Brachial: 2.9 mm, tri  Radial: 2.0 mm, tri  Ulnar: 1.9 mm, tri  L arm:   Brachial: 2.3 mm, tri  Radial: 1.6 mm, tri  Ulnar: 1.8 mm, tri  Outside Studies/Documentation 5 pages of outside documents were reviewed including: outpatient nephrology chart.  Medical Decision Making  Natasha [name] is a 68 y.o. female who presents with chronic kidney disease stage IV  Based on vein mapping and examination, this patient's permanent access options include: possible R stage BVT.   Pt's arteries are small which limit her access options.  She is probably looking at looped upper arm AVG if this fistula fails.  If the vessels in the R arm turn out to be too small, I have discussed aborting the case with the patient as she is not in the one month window for placement of AVG at this time.  I had an extensive discussion with this patient in regards to the nature of access surgery, including risk, benefits, and alternatives.    The patient is aware that the risks of access surgery include but are not limited to: bleeding, infection, steal syndrome, nerve damage, ischemic monomelic neuropathy, failure of access to mature, and possible need for additional access procedures in the future.  I discussed with the patient the nature of the staged access procedure, specifically the need for a second operation to transpose the first stage fistula if it matures adequately.   The patient is considering proceeding and wants to discuss it further with her nephrologist.   Adele Barthel, MD [facility] Office: [phone] Pager: [phone]  05/05/2014, 1:48 PM

## 2014-08-11 ENCOUNTER — Telehealth: Payer: Self-pay | Admitting: *Deleted

## 2014-08-11 NOTE — Telephone Encounter (Signed)
WILL CALL FILE follow up: We have not heard from patient regarding scheduling of Right arm AVF as per Dr. Lianne Moris office note on 05-05-14. I called and left a message with Dr. Etheleen Nicks nurse, [facility], to call back if this patient was still in need of HD access. I will put this booking information sheet back in Dr. Lianne Moris pending folder.

## 2014-09-04 ENCOUNTER — Ambulatory Visit: Payer: Medicare Other | Admitting: Adult Health

## 2014-09-09 ENCOUNTER — Inpatient Hospital Stay (HOSPITAL_COMMUNITY)
Admission: EM | Admit: 2014-09-09 | Discharge: 2014-09-14 | DRG: 345 | Disposition: A | Discharge status: Home / Self Care | Payer: Medicare Other | Attending: Internal Medicine | Admitting: Internal Medicine

## 2014-09-09 ENCOUNTER — Encounter (HOSPITAL_COMMUNITY): Payer: Self-pay | Admitting: Emergency Medicine

## 2014-09-09 ENCOUNTER — Emergency Department (HOSPITAL_COMMUNITY): Payer: Medicare Other

## 2014-09-09 ENCOUNTER — Other Ambulatory Visit (HOSPITAL_COMMUNITY): Payer: Self-pay

## 2014-09-09 ENCOUNTER — Inpatient Hospital Stay (HOSPITAL_COMMUNITY): Payer: Medicare Other

## 2014-09-09 DIAGNOSIS — K219 Gastro-esophageal reflux disease without esophagitis: Secondary | ICD-10-CM | POA: Diagnosis present

## 2014-09-09 DIAGNOSIS — M79604 Pain in right leg: Secondary | ICD-10-CM

## 2014-09-09 DIAGNOSIS — K6289 Other specified diseases of anus and rectum: Secondary | ICD-10-CM

## 2014-09-09 DIAGNOSIS — K613 Ischiorectal abscess: Principal | ICD-10-CM | POA: Diagnosis present

## 2014-09-09 DIAGNOSIS — Z7952 Long term (current) use of systemic steroids: Secondary | ICD-10-CM

## 2014-09-09 DIAGNOSIS — Z992 Dependence on renal dialysis: Secondary | ICD-10-CM

## 2014-09-09 DIAGNOSIS — K5010 Crohn's disease of large intestine without complications: Secondary | ICD-10-CM | POA: Diagnosis present

## 2014-09-09 DIAGNOSIS — R197 Diarrhea, unspecified: Secondary | ICD-10-CM

## 2014-09-09 DIAGNOSIS — M4806 Spinal stenosis, lumbar region: Secondary | ICD-10-CM | POA: Diagnosis present

## 2014-09-09 DIAGNOSIS — K6139 Other ischiorectal abscess: Secondary | ICD-10-CM | POA: Diagnosis present

## 2014-09-09 DIAGNOSIS — I82402 Acute embolism and thrombosis of unspecified deep veins of left lower extremity: Secondary | ICD-10-CM | POA: Diagnosis present

## 2014-09-09 DIAGNOSIS — Z7901 Long term (current) use of anticoagulants: Secondary | ICD-10-CM

## 2014-09-09 DIAGNOSIS — N184 Chronic kidney disease, stage 4 (severe): Secondary | ICD-10-CM | POA: Diagnosis present

## 2014-09-09 DIAGNOSIS — E039 Hypothyroidism, unspecified: Secondary | ICD-10-CM | POA: Diagnosis present

## 2014-09-09 DIAGNOSIS — I5030 Unspecified diastolic (congestive) heart failure: Secondary | ICD-10-CM | POA: Diagnosis present

## 2014-09-09 DIAGNOSIS — H409 Unspecified glaucoma: Secondary | ICD-10-CM | POA: Diagnosis present

## 2014-09-09 DIAGNOSIS — M79605 Pain in left leg: Secondary | ICD-10-CM

## 2014-09-09 DIAGNOSIS — E440 Moderate protein-calorie malnutrition: Secondary | ICD-10-CM | POA: Insufficient documentation

## 2014-09-09 DIAGNOSIS — I959 Hypotension, unspecified: Secondary | ICD-10-CM | POA: Diagnosis present

## 2014-09-09 DIAGNOSIS — D638 Anemia in other chronic diseases classified elsewhere: Secondary | ICD-10-CM | POA: Diagnosis present

## 2014-09-09 DIAGNOSIS — M48061 Spinal stenosis, lumbar region without neurogenic claudication: Secondary | ICD-10-CM | POA: Diagnosis present

## 2014-09-09 DIAGNOSIS — K50918 Crohn's disease, unspecified, with other complication: Secondary | ICD-10-CM

## 2014-09-09 DIAGNOSIS — Z9049 Acquired absence of other specified parts of digestive tract: Secondary | ICD-10-CM | POA: Diagnosis present

## 2014-09-09 DIAGNOSIS — J449 Chronic obstructive pulmonary disease, unspecified: Secondary | ICD-10-CM | POA: Diagnosis present

## 2014-09-09 DIAGNOSIS — I5042 Chronic combined systolic (congestive) and diastolic (congestive) heart failure: Secondary | ICD-10-CM | POA: Diagnosis present

## 2014-09-09 DIAGNOSIS — I129 Hypertensive chronic kidney disease with stage 1 through stage 4 chronic kidney disease, or unspecified chronic kidney disease: Secondary | ICD-10-CM | POA: Diagnosis present

## 2014-09-09 DIAGNOSIS — E162 Hypoglycemia, unspecified: Secondary | ICD-10-CM | POA: Diagnosis present

## 2014-09-09 DIAGNOSIS — E876 Hypokalemia: Secondary | ICD-10-CM | POA: Diagnosis not present

## 2014-09-09 DIAGNOSIS — N186 End stage renal disease: Secondary | ICD-10-CM | POA: Diagnosis present

## 2014-09-09 DIAGNOSIS — Z86718 Personal history of other venous thrombosis and embolism: Secondary | ICD-10-CM

## 2014-09-09 DIAGNOSIS — E860 Dehydration: Secondary | ICD-10-CM | POA: Diagnosis present

## 2014-09-09 DIAGNOSIS — E872 Acidosis, unspecified: Secondary | ICD-10-CM | POA: Diagnosis present

## 2014-09-09 DIAGNOSIS — R112 Nausea with vomiting, unspecified: Secondary | ICD-10-CM

## 2014-09-09 DIAGNOSIS — M810 Age-related osteoporosis without current pathological fracture: Secondary | ICD-10-CM | POA: Diagnosis present

## 2014-09-09 DIAGNOSIS — K50919 Crohn's disease, unspecified, with unspecified complications: Secondary | ICD-10-CM | POA: Diagnosis not present

## 2014-09-09 DIAGNOSIS — Z87891 Personal history of nicotine dependence: Secondary | ICD-10-CM | POA: Diagnosis not present

## 2014-09-09 DIAGNOSIS — K5090 Crohn's disease, unspecified, without complications: Secondary | ICD-10-CM | POA: Diagnosis present

## 2014-09-09 LAB — COMPREHENSIVE METABOLIC PANEL
ALT: 12 U/L (ref 0–35)
AST: 15 U/L (ref 0–37)
Albumin: 2.5 g/dL — ABNORMAL LOW (ref 3.5–5.2)
Alkaline Phosphatase: 127 U/L — ABNORMAL HIGH (ref 39–117)
Anion gap: 12 (ref 5–15)
BILIRUBIN TOTAL: 0.4 mg/dL (ref 0.3–1.2)
BUN: 49 mg/dL — ABNORMAL HIGH (ref 6–23)
CHLORIDE: 109 mmol/L (ref 96–112)
CO2: 12 mmol/L — AB (ref 19–32)
Calcium: 9.6 mg/dL (ref 8.4–10.5)
Creatinine, Ser: 3.67 mg/dL — ABNORMAL HIGH (ref 0.50–1.10)
GFR calc Af Amer: 14 mL/min — ABNORMAL LOW (ref >90)
GFR calc non Af Amer: 12 mL/min — ABNORMAL LOW (ref >90)
GLUCOSE: 100 mg/dL — AB (ref 70–99)
Potassium: 3.6 mmol/L (ref 3.5–5.1)
SODIUM: 133 mmol/L — AB (ref 135–145)
Total Protein: 6.9 g/dL (ref 6.0–8.3)

## 2014-09-09 LAB — URINALYSIS, ROUTINE W REFLEX MICROSCOPIC
Bilirubin Urine: NEGATIVE
Glucose, UA: NEGATIVE mg/dL
Ketones, ur: NEGATIVE mg/dL
Leukocytes, UA: NEGATIVE
NITRITE: NEGATIVE
PROTEIN: 30 mg/dL — AB
Specific Gravity, Urine: 1.01 (ref 1.005–1.030)
Urobilinogen, UA: 0.2 mg/dL (ref 0.0–1.0)
pH: 5.5 (ref 5.0–8.0)

## 2014-09-09 LAB — PROTIME-INR
INR: 2.67 — ABNORMAL HIGH (ref 0.00–1.49)
PROTHROMBIN TIME: 28.6 s — AB (ref 11.6–15.2)

## 2014-09-09 LAB — CBC WITH DIFFERENTIAL/PLATELET
BASOS PCT: 0 % (ref 0–1)
Basophils Absolute: 0 10*3/uL (ref 0.0–0.1)
EOS ABS: 0 10*3/uL (ref 0.0–0.7)
Eosinophils Relative: 0 % (ref 0–5)
HCT: 32.5 % — ABNORMAL LOW (ref 36.0–46.0)
Hemoglobin: 10.1 g/dL — ABNORMAL LOW (ref 12.0–15.0)
LYMPHS ABS: 1.3 10*3/uL (ref 0.7–4.0)
Lymphocytes Relative: 11 % — ABNORMAL LOW (ref 12–46)
MCH: 28.1 pg (ref 26.0–34.0)
MCHC: 31.1 g/dL (ref 30.0–36.0)
MCV: 90.3 fL (ref 78.0–100.0)
MONOS PCT: 11 % (ref 3–12)
Monocytes Absolute: 1.2 10*3/uL — ABNORMAL HIGH (ref 0.1–1.0)
Neutro Abs: 9 10*3/uL — ABNORMAL HIGH (ref 1.7–7.7)
Neutrophils Relative %: 78 % — ABNORMAL HIGH (ref 43–77)
PLATELETS: 342 10*3/uL (ref 150–400)
RBC: 3.6 MIL/uL — ABNORMAL LOW (ref 3.87–5.11)
RDW: 14.7 % (ref 11.5–15.5)
WBC: 11.5 10*3/uL — ABNORMAL HIGH (ref 4.0–10.5)

## 2014-09-09 LAB — GLUCOSE, CAPILLARY
GLUCOSE-CAPILLARY: 67 mg/dL — AB (ref 70–99)
Glucose-Capillary: 105 mg/dL — ABNORMAL HIGH (ref 70–99)

## 2014-09-09 LAB — URINE MICROSCOPIC-ADD ON

## 2014-09-09 LAB — I-STAT CG4 LACTIC ACID, ED: Lactic Acid, Venous: 1.08 mmol/L (ref 0.5–2.0)

## 2014-09-09 MED ORDER — SODIUM CHLORIDE 0.9 % IV BOLUS (SEPSIS)
1000.0000 mL | Freq: Once | INTRAVENOUS | Status: AC
  Administered 2014-09-09: 1000 mL via INTRAVENOUS

## 2014-09-09 MED ORDER — HYDROCORTISONE NA SUCCINATE PF 100 MG IJ SOLR
50.0000 mg | Freq: Two times a day (BID) | INTRAMUSCULAR | Status: DC
  Administered 2014-09-09 – 2014-09-13 (×9): 50 mg via INTRAVENOUS
  Filled 2014-09-09 (×15): qty 1
  Filled 2014-09-09: qty 2

## 2014-09-09 MED ORDER — FENTANYL CITRATE (PF) 100 MCG/2ML IJ SOLN
50.0000 ug | Freq: Once | INTRAMUSCULAR | Status: AC
  Administered 2014-09-09: 50 ug via INTRAVENOUS
  Filled 2014-09-09: qty 2

## 2014-09-09 MED ORDER — DEXTROSE 50 % IV SOLN
INTRAVENOUS | Status: AC
  Administered 2014-09-09: 25 mL
  Filled 2014-09-09: qty 50

## 2014-09-09 MED ORDER — IOHEXOL 300 MG/ML  SOLN
50.0000 mL | Freq: Once | INTRAMUSCULAR | Status: AC | PRN
  Administered 2014-09-09: 50 mL via ORAL

## 2014-09-09 MED ORDER — ONDANSETRON HCL 4 MG/2ML IJ SOLN
4.0000 mg | Freq: Once | INTRAMUSCULAR | Status: AC
Start: 2014-09-09 — End: 2014-09-09
  Administered 2014-09-09: 4 mg via INTRAVENOUS
  Filled 2014-09-09: qty 2

## 2014-09-09 MED ORDER — PIPERACILLIN-TAZOBACTAM 3.375 G IVPB
3.3750 g | Freq: Once | INTRAVENOUS | Status: AC
  Administered 2014-09-09: 3.375 g via INTRAVENOUS
  Filled 2014-09-09: qty 50

## 2014-09-09 MED ORDER — FENTANYL CITRATE (PF) 100 MCG/2ML IJ SOLN
100.0000 ug | Freq: Once | INTRAMUSCULAR | Status: AC
  Administered 2014-09-09: 100 ug via INTRAVENOUS
  Filled 2014-09-09: qty 2

## 2014-09-09 MED ORDER — SODIUM BICARBONATE 650 MG PO TABS
650.0000 mg | ORAL_TABLET | Freq: Two times a day (BID) | ORAL | Status: DC
  Administered 2014-09-09 – 2014-09-14 (×10): 650 mg via ORAL
  Filled 2014-09-09 (×13): qty 1

## 2014-09-09 MED ORDER — DEXTROSE 5 % IV SOLN
INTRAVENOUS | Status: DC
  Administered 2014-09-09 – 2014-09-11 (×3): via INTRAVENOUS

## 2014-09-09 MED ORDER — LEVOTHYROXINE SODIUM 125 MCG PO TABS
125.0000 ug | ORAL_TABLET | Freq: Every day | ORAL | Status: DC
  Administered 2014-09-10 – 2014-09-14 (×4): 125 ug via ORAL
  Filled 2014-09-09 (×6): qty 1

## 2014-09-09 MED ORDER — TIOTROPIUM BROMIDE MONOHYDRATE 18 MCG IN CAPS
18.0000 ug | ORAL_CAPSULE | RESPIRATORY_TRACT | Status: DC | PRN
  Filled 2014-09-09: qty 5

## 2014-09-09 MED ORDER — HYDROMORPHONE HCL 1 MG/ML IJ SOLN
1.0000 mg | INTRAMUSCULAR | Status: DC | PRN
  Administered 2014-09-09: 1 mg via INTRAVENOUS
  Filled 2014-09-09: qty 1

## 2014-09-09 MED ORDER — BECLOMETHASONE DIPROPIONATE 80 MCG/ACT IN AERS
2.0000 | INHALATION_SPRAY | RESPIRATORY_TRACT | Status: DC | PRN
  Filled 2014-09-09: qty 8.7

## 2014-09-09 MED ORDER — ONDANSETRON HCL 4 MG/2ML IJ SOLN
4.0000 mg | Freq: Once | INTRAMUSCULAR | Status: AC
  Administered 2014-09-09: 4 mg via INTRAVENOUS
  Filled 2014-09-09: qty 2

## 2014-09-09 MED ORDER — SODIUM CHLORIDE 0.9 % IV SOLN: INTRAVENOUS | Status: DC

## 2014-09-09 MED ORDER — HYDROMORPHONE HCL 1 MG/ML IJ SOLN: 1.0000 mg | INTRAMUSCULAR | Status: DC | PRN

## 2014-09-09 MED ORDER — SODIUM CHLORIDE 0.9 % IV BOLUS (SEPSIS)
500.0000 mL | Freq: Once | INTRAVENOUS | Status: AC
  Administered 2014-09-09: 500 mL via INTRAVENOUS

## 2014-09-09 MED ORDER — CALCITRIOL 0.5 MCG PO CAPS
0.5000 ug | ORAL_CAPSULE | Freq: Every day | ORAL | Status: DC
  Administered 2014-09-09 – 2014-09-14 (×5): 0.5 ug via ORAL
  Filled 2014-09-09 (×6): qty 1

## 2014-09-09 MED ORDER — STERILE WATER FOR INJECTION IV SOLN
INTRAVENOUS | Status: DC
  Administered 2014-09-11 – 2014-09-13 (×3): via INTRAVENOUS
  Filled 2014-09-09 (×9): qty 900

## 2014-09-09 MED ORDER — SODIUM CHLORIDE 0.9 % IJ SOLN
3.0000 mL | Freq: Two times a day (BID) | INTRAMUSCULAR | Status: DC
  Administered 2014-09-09 – 2014-09-13 (×6): 3 mL via INTRAVENOUS

## 2014-09-09 MED ORDER — SODIUM CHLORIDE 0.9 % IV SOLN
INTRAVENOUS | Status: DC
  Administered 2014-09-09: 12:00:00 via INTRAVENOUS
  Filled 2014-09-09 (×2): qty 1000

## 2014-09-09 MED ORDER — DEXTROSE-NACL 5-0.9 % IV SOLN: INTRAVENOUS | Status: DC

## 2014-09-09 NOTE — ED Notes (Signed)
Pt states she had 14 loose BM in the past 24 hours and she is been vomiting. C/o 10/10 pain on her rectum and having a cyst there that is progressively getting worse. Pt denies any bleeding.

## 2014-09-09 NOTE — ED Notes (Signed)
Assisted to BR,

## 2014-09-09 NOTE — Progress Notes (Signed)
ANTICOAGULATION CONSULT NOTE - Initial Consult  Pharmacy Consult for Heparin Indication: DVT  Allergies  Allergen Reactions  . Mercaptopurine Other (See Comments)    Unknown reaction  . Remicade [Infliximab] Other (See Comments)    Unknown reaction    Patient Measurements: Height: 5' 3"  (160 cm) Weight: 109 lb (49.442 kg) IBW/kg (Calculated) : 52.4  Vital Signs: Temp: 98.7 F (37.1 C) (04/16 0939) Temp Source: Oral (04/16 0939) BP: 103/60 mmHg (04/16 0939) Pulse Rate: 97 (04/16 0939)  Labs:  Recent Labs  09/09/14 0600  HGB 10.1*  HCT 32.5*  PLT 342  LABPROT 28.6*  INR 2.67*  CREATININE 3.67*    Estimated Creatinine Clearance: 11.4 mL/min (by C-G formula based on Cr of 3.67).   Medical History: Past Medical History  Diagnosis Date  . Chronic kidney disease   . GERD (gastroesophageal reflux disease)   . Crohn's colitis   . CKD (chronic kidney disease) 06/09/2012  . Hypothyroidism 06/10/2012  . Arthritis   . Osteoporosis   . Hypertension   . Plantar warts   . Postmenopausal   . Glaucoma   . Bulging disc     L3-L4  . Atypical chest pain   . Emphysema/COPD   . Anemia     Medications:  Infusions:  . sodium chloride    . sodium chloride    . piperacillin-tazobactam (ZOSYN)  IV 3.375 g (09/09/14 1005)    Assessment: 70 year old female presenting with diarrhea, emesis, and rectal pain.  She has a mass near her rectum and anus on CT.  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  Her INR is currently therapeutic. Coumadin will be held for possible surgical intervention and she will be bridged with Heparin.  As her INR is therapeutic she does not require Heparin currently.  Goal of Therapy:  Heparin level 0.3-0.7 units/ml Monitor platelets by anticoagulation protocol: Yes   Plan:  Daily PT/INR - start Heparin when INR approaches 2.  Legrand Como, Pharm.D., BCPS, AAHIVP Clinical Pharmacist Phone: [phone] or  [phone] 09/09/2014, 10:54 AM

## 2014-09-09 NOTE — Consult Note (Signed)
Reason for Consult:rectal mass Referring Physician: Alecia Lemming, pa  Natasha [name] is an 69 y.o. female.  HPI: 66 yof with longstanding crohns disease followed by Dr Wynetta Emery. Disease since 1978. Apparently maintained on low dose prednisone.  She has prior history 3 surgeries that sound like she has near tac with resection of TI.  She has history of colonoscopy 2011 but was apparently supposed to get one in November. This was cancelled as she was placed on coumadin for dvt after a fall. She presents today with diarrhea last weekend leading to pain at anus and rectum. She is still having some bms.  No relief. She also has begun vomiting. She has undergone ct that shows an "inflammatory mass".    Past Medical History  Diagnosis Date  . Chronic kidney disease   . GERD (gastroesophageal reflux disease)   . Crohn's colitis   . CKD (chronic kidney disease) 06/09/2012  . Hypothyroidism 06/10/2012  . Arthritis   . Osteoporosis   . Hypertension   . Plantar warts   . Postmenopausal   . Glaucoma   . Bulging disc     L3-L4  . Atypical chest pain   . Emphysema/COPD   . Anemia     Past Surgical History  Procedure Laterality Date  . Colon resection    . Esophagogastroduodenoscopy  03/04/2012    Procedure: ESOPHAGOGASTRODUODENOSCOPY (EGD);  Surgeon: Garlan Fair, MD;  Location: [facility];  Service: Endoscopy;  Laterality: N/A;  . Flexible sigmoidoscopy  03/04/2012    Procedure: FLEXIBLE SIGMOIDOSCOPY;  Surgeon: Garlan Fair, MD;  Location: [facility];  Service: Endoscopy;  Laterality: N/A;  . Abdominal hysterectomy    . Appendectomy    . Thyroidectomy      Family History  Problem Relation Age of Onset  . Hypertension Father   . Heart disease Father   . Heart attack Father   . Diverticulitis Mother   . COPD Mother   . Hypertension Mother   . Heart disease Mother   . Heart attack Mother   . Colon cancer Brother   . Diabetes Brother   . Thyroid disease Brother      Social History:  reports that she has quit smoking. Her smoking use included Cigarettes. She has a 3.75 pack-year smoking history. She has never used smokeless tobacco. She reports that she drinks about 1.2 oz of alcohol per week. She reports that she does not use illicit drugs.  Allergies:  Allergies  Allergen Reactions  . Mercaptopurine Other (See Comments)    Unknown reaction  . Remicade [Infliximab] Other (See Comments)    Unknown reaction    Medications: I have reviewed the patient's current medications.  Results for orders placed or performed during the hospital encounter of 09/09/14 (from the past 48 hour(s))  Comprehensive metabolic panel     Status: Abnormal   Collection Time: 09/09/14  6:00 AM  Result Value Ref Range   Sodium 133 (L) 135 - 145 mmol/L   Potassium 3.6 3.5 - 5.1 mmol/L   Chloride 109 96 - 112 mmol/L   CO2 12 (L) 19 - 32 mmol/L   Glucose, Bld 100 (H) 70 - 99 mg/dL   BUN 49 (H) 6 - 23 mg/dL   Creatinine, Ser 3.67 (H) 0.50 - 1.10 mg/dL   Calcium 9.6 8.4 - 10.5 mg/dL   Total Protein 6.9 6.0 - 8.3 g/dL   Albumin 2.5 (L) 3.5 - 5.2 g/dL   AST 15 0 - 37 U/L  ALT 12 0 - 35 U/L   Alkaline Phosphatase 127 (H) 39 - 117 U/L   Total Bilirubin 0.4 0.3 - 1.2 mg/dL   GFR calc non Af Amer 12 (L) >90 mL/min   GFR calc Af Amer 14 (L) >90 mL/min    Comment: (NOTE) The eGFR has been calculated using the CKD EPI equation. This calculation has not been validated in all clinical situations. eGFR's persistently <90 mL/min signify possible Chronic Kidney Disease.    Anion gap 12 5 - 15  CBC with Differential     Status: Abnormal   Collection Time: 09/09/14  6:00 AM  Result Value Ref Range   WBC 11.5 (H) 4.0 - 10.5 K/uL   RBC 3.60 (L) 3.87 - 5.11 MIL/uL   Hemoglobin 10.1 (L) 12.0 - 15.0 g/dL   HCT 32.5 (L) 36.0 - 46.0 %   MCV 90.3 78.0 - 100.0 fL   MCH 28.1 26.0 - 34.0 pg   MCHC 31.1 30.0 - 36.0 g/dL   RDW 14.7 11.5 - 15.5 %   Platelets 342 150 - 400 K/uL    Neutrophils Relative % 78 (H) 43 - 77 %   Neutro Abs 9.0 (H) 1.7 - 7.7 K/uL   Lymphocytes Relative 11 (L) 12 - 46 %   Lymphs Abs 1.3 0.7 - 4.0 K/uL   Monocytes Relative 11 3 - 12 %   Monocytes Absolute 1.2 (H) 0.1 - 1.0 K/uL   Eosinophils Relative 0 0 - 5 %   Eosinophils Absolute 0.0 0.0 - 0.7 K/uL   Basophils Relative 0 0 - 1 %   Basophils Absolute 0.0 0.0 - 0.1 K/uL  Protime-INR     Status: Abnormal   Collection Time: 09/09/14  6:00 AM  Result Value Ref Range   Prothrombin Time 28.6 (H) 11.6 - 15.2 seconds   INR 2.67 (H) 0.00 - 1.49  I-Stat CG4 Lactic Acid, ED     Status: None   Collection Time: 09/09/14  6:37 AM  Result Value Ref Range   Lactic Acid, Venous 1.08 0.5 - 2.0 mmol/L  Urinalysis, Routine w reflex microscopic     Status: Abnormal   Collection Time: 09/09/14  8:50 AM  Result Value Ref Range   Color, Urine YELLOW YELLOW   APPearance CLOUDY (A) CLEAR   Specific Gravity, Urine 1.010 1.005 - 1.030   pH 5.5 5.0 - 8.0   Glucose, UA NEGATIVE NEGATIVE mg/dL   Hgb urine dipstick TRACE (A) NEGATIVE   Bilirubin Urine NEGATIVE NEGATIVE   Ketones, ur NEGATIVE NEGATIVE mg/dL   Protein, ur 30 (A) NEGATIVE mg/dL   Urobilinogen, UA 0.2 0.0 - 1.0 mg/dL   Nitrite NEGATIVE NEGATIVE   Leukocytes, UA NEGATIVE NEGATIVE  Urine microscopic-add on     Status: Abnormal   Collection Time: 09/09/14  8:50 AM  Result Value Ref Range   Squamous Epithelial / LPF MANY (A) RARE   WBC, UA 3-6 <3 WBC/hpf   RBC / HPF 0-2 <3 RBC/hpf   Bacteria, UA FEW (A) RARE   Casts HYALINE CASTS (A) NEGATIVE   Urine-Other AMORPHOUS URATES/PHOSPHATES     Ct Abdomen Pelvis Wo Contrast  09/09/2014   CLINICAL DATA:  H/o crohn's disease, with worsening rectal pain and diarrhea the last few days. Hx: Hysterectomy, Appendectomy, Colon resection  EXAM: CT ABDOMEN AND PELVIS WITHOUT CONTRAST  TECHNIQUE: Multidetector CT imaging of the abdomen and pelvis was performed following the standard protocol without IV contrast.   COMPARISON:  03/29/2014  FINDINGS: There is ill-defined masslike area of heterogeneous soft tissue attenuation that lies to the right of the anus in the peritoneal fat below the levator sling. It measures 5.5 cm x 3.9 cm x 5.3 cm. This is likely an inflammatory mass. It is higher in attenuation in more heterogeneous than expected from an abscess, and also less well-defined. There is questionable wall thickening in the adjacent anus and rectum.  There is an ileocolic anastomosis in the anterior mid to low central abdomen. The wall of the colon and adjacent small bowel at the anastomosis is thickened and irregular.  Remainder of the colon and small bowel show normal wall thickness with no adjacent mesenteric inflammation.  Lung bases are essentially clear. Heart is normal in size. Trace amount of pericardial fluid is noted.  Liver and spleen are unremarkable. Gallbladder surgically absent. Mild chronic dilation of common bile duct is stable. Normal pancreas. No adrenal masses.  Mild left renal cortical thinning. Bilateral nonobstructing intrarenal stones. Small low-density left midpole renal cortical lesion consistent with a cyst, stable. No other renal masses. No hydronephrosis. Normal ureters. Bladder is unremarkable.  Uterus is surgically absent.  Atherosclerotic calcifications are noted along the abdominal aorta and iliac arteries. No aneurysm. No pathologically enlarged lymph nodes. No ascites.  Bones are demineralized. There are mild degenerative changes of the visualized spine. No osteoblastic or osteolytic lesions.  IMPRESSION: 1. Irregular wall thickening of the colon and adjacent small bowel at the anterior central to low abdomen ileocolic anastomosis. This suggest active Crohn's disease although there is no significant adjacent mesenteric inflammation. 2. Ill-defined focal heterogeneous soft tissue abnormality below the levator sling on the right adjacent to the right aspect of the anus in lower rectum.  This has an inflammatory appearance. It is new since the prior CT. 3. No other evidence of an acute abnormality or of active Crohn's disease. 4. Numerous bilateral nonobstructing intrarenal stones. No ureteral stones or hydronephrosis.   Electronically Signed   By: Lajean Manes M.D.   On: 09/09/2014 09:54   Dg Chest Port 1 View  09/09/2014   CLINICAL DATA:  Ex-smoker.  COPD.  Hypertension.  EXAM: PORTABLE CHEST - 1 VIEW  COMPARISON:  03/29/2014.  FINDINGS: The cardiac silhouette remains borderline enlarged. Clear lungs. Mild diffuse peribronchial thickening with mild progression. Bilateral neck surgical clips are again demonstrated. Mild scoliosis.  IMPRESSION: Mildly progressive chronic bronchitic changes.   Electronically Signed   By: Claudie Revering M.D.   On: 09/09/2014 12:33    Review of Systems  Constitutional: Negative for fever and chills.  Respiratory: Negative for cough.   Cardiovascular: Negative for chest pain.  Gastrointestinal: Positive for nausea, vomiting and diarrhea. Negative for abdominal pain and blood in stool.  Genitourinary: Negative for dysuria.  Neurological: Positive for headaches.   Blood pressure 98/58, pulse 98, temperature 100.7 F (38.2 C), temperature source Oral, resp. rate 22, height _0  (1.6 m), weight 49.442 kg (109 lb), SpO2 100 %. Physical Exam  Vitals reviewed. Constitutional: She appears well-developed and well-nourished.  Eyes: No scleral icterus.  Neck: Neck supple.  Cardiovascular: Normal rate and regular rhythm.   Respiratory: Effort normal and breath sounds normal.  GI: Soft. Bowel sounds are normal. There is no tenderness.  Genitourinary:  Tender to right of anus, no redness, rectal exam shows tenderness, ? induration  Lymphadenopathy:    She has no cervical adenopathy.  Neurological: She is alert.    Assessment/Plan: Likely abscess, perirectal crohns disease  Would start  abx and admit I think she may end up needing eua and possible  drainage but inr 2.67 today.  Would stop coumadin and when inr normal can do surgery through heparin window if this ok with medical team.   [facility] 09/09/2014, 2:21 PM

## 2014-09-09 NOTE — ED Provider Notes (Signed)
CSN: [id number]     Arrival date & time 09/09/14  3545 History   First MD Initiated Contact with Patient 09/09/14 0601     Chief Complaint  Patient presents with  . Diarrhea  . Emesis    (Consider location/radiation/quality/duration/timing/severity/associated sxs/prior Treatment) HPI Comments: Patient with h/o CKD, Crohn's disease (dx 1978) on prednisone, asacol, DVT on warfarin -- presents with a seven-day history of watery diarrhea. She has also had rectal pain and has not been able to sit due to the pain. This is progressively worsened over the past week. Patient saw her primary care physician 3 days ago. She was told that she would need to go to the emergency department if she continued to feel worse. Patient denies fevers but has had chills. She has had multiple episodes of vomiting the past 3 days. She's having difficulty keeping down fluids at this time. She has not eaten any solid foods in the past several days. No chest pain or shortness of breath. She has lower abdominal pain. Decreased urinary output. No lower extremity swelling. No recent antibiotics. The onset of this condition was acute. The course is constant. Aggravating factors: none. Alleviating factors: none.    Patient is a 69 y.o. female presenting with diarrhea and vomiting. The history is provided by the patient and medical records.  Diarrhea Associated symptoms: abdominal pain and vomiting   Associated symptoms: no fever, no headaches and no myalgias   Emesis Associated symptoms: abdominal pain and diarrhea   Associated symptoms: no headaches, no myalgias and no sore throat     Past Medical History  Diagnosis Date  . Chronic kidney disease   . GERD (gastroesophageal reflux disease)   . Crohn's colitis   . CKD (chronic kidney disease) 06/09/2012  . Hypothyroidism 06/10/2012  . Arthritis   . Osteoporosis   . Hypertension   . Plantar warts   . Postmenopausal   . Glaucoma   . Bulging disc     L3-L4  . Atypical  chest pain   . Emphysema/COPD   . Anemia    Past Surgical History  Procedure Laterality Date  . Colon resection    . Esophagogastroduodenoscopy  03/04/2012    Procedure: ESOPHAGOGASTRODUODENOSCOPY (EGD);  Surgeon: Garlan Fair, MD;  Location: [facility];  Service: Endoscopy;  Laterality: N/A;  . Flexible sigmoidoscopy  03/04/2012    Procedure: FLEXIBLE SIGMOIDOSCOPY;  Surgeon: Garlan Fair, MD;  Location: [facility];  Service: Endoscopy;  Laterality: N/A;  . Abdominal hysterectomy    . Appendectomy    . Thyroidectomy     Family History  Problem Relation Age of Onset  . Hypertension Father   . Heart disease Father   . Heart attack Father   . Diverticulitis Mother   . COPD Mother   . Hypertension Mother   . Heart disease Mother   . Heart attack Mother   . Colon cancer Brother   . Diabetes Brother   . Thyroid disease Brother    History  Substance Use Topics  . Smoking status: Former Smoker -- 0.25 packs/day for 15 years    Types: Cigarettes  . Smokeless tobacco: Never Used     Comment: Quit smoking in 2003  . Alcohol Use: 1.2 oz/week    2 Glasses of wine per week     Comment: 1-2 drinks monthly   OB History    No data available     Review of Systems  Constitutional: Negative for fever.  HENT: Negative for  rhinorrhea and sore throat.   Eyes: Negative for redness.  Respiratory: Negative for cough.   Cardiovascular: Negative for chest pain.  Gastrointestinal: Positive for nausea, vomiting, abdominal pain, diarrhea and rectal pain.  Genitourinary: Positive for decreased urine volume. Negative for dysuria and flank pain.  Musculoskeletal: Negative for myalgias.  Skin: Negative for rash.  Neurological: Negative for headaches.    Allergies  Mercaptopurine and Remicade  Home Medications   Prior to Admission medications   Medication Sig Start Date End Date Taking? Authorizing Provider  acetaminophen (TYLENOL) 500 MG tablet Take 500 mg by mouth every 6  (six) hours as needed for mild pain.    Yes Historical Provider, MD  albuterol (PROVENTIL HFA;VENTOLIN HFA) 108 (90 BASE) MCG/ACT inhaler Inhale 2 puffs into the lungs every 6 (six) hours as needed for wheezing or shortness of breath.   Yes Historical Provider, MD  calcitRIOL (ROCALTROL) 0.5 MCG capsule Take 0.5 mcg by mouth daily.   Yes Historical Provider, MD  Cholecalciferol (VITAMIN D) 2000 UNITS tablet Take 1 tablet (2,000 Units total) by mouth 3 (three) times daily. Patient taking differently: Take 2,000 Units by mouth daily.  04/07/14  Yes Silver Huguenin Elgergawy, MD  cyanocobalamin (,VITAMIN B-12,) 1000 MCG/ML injection Inject 1,000 mcg into the muscle every 30 (thirty) days. Last injection was 04/28/12   Yes Historical Provider, MD  cyclobenzaprine (FLEXERIL) 10 MG tablet Take 10 mg by mouth as needed for muscle spasms.    Yes Historical Provider, MD  folic acid (FOLVITE) 1 MG tablet Take 1 mg by mouth daily.   Yes Historical Provider, MD  levothyroxine (SYNTHROID, LEVOTHROID) 125 MCG tablet Take 125 mcg by mouth daily.   Yes Historical Provider, MD  MAGNESIUM-OXIDE 400 (241.3 MG) MG tablet Take 400 mg by mouth daily.  04/12/14  Yes Historical Provider, MD  predniSONE (DELTASONE) 5 MG tablet Take 5 mg by mouth daily with breakfast.   Yes Historical Provider, MD  promethazine (PHENERGAN) 25 MG tablet Take 25 mg by mouth every 6 (six) hours as needed for nausea or vomiting.  04/19/14  Yes Historical Provider, MD  QVAR 80 MCG/ACT inhaler Inhale 2 puffs into the lungs as needed (for wheezing).  08/27/13  Yes Historical Provider, MD  sodium bicarbonate 650 MG tablet Take 1 tablet (650 mg total) by mouth 2 (two) times daily. 04/07/14  Yes Albertine Patricia, MD  SPIRIVA HANDIHALER 18 MCG inhalation capsule Place 18 mcg into inhaler and inhale as needed (for COPD).  09/07/13  Yes Historical Provider, MD  traMADol (ULTRAM) 50 MG tablet Take 50 mg by mouth every 6 (six) hours as needed for moderate pain.   03/21/14  Yes Historical Provider, MD  warfarin (COUMADIN) 5 MG tablet Take 1 tablet (5 mg total) by mouth See admin instructions. Please   -take One  and half tablet (7.5 mg) on friday 11/13 at 6 PM,  - take One  and half tablet (7.5 mg) on Saturday 11/14 at 6 PM,  - take One tablet (5 mg) on Sunday   11/15 at 6 PM,  - he will follow with your PCP on Monday morning regarding your INR level, and your PCP will follow and dose warfarin. Patient taking differently: Take 5-12.5 mg by mouth See admin instructions. Take 48m everyday except 4/17 take 12.512muntil next INR check 04/07/14  Yes DaSilver Hugueninlgergawy, MD  calcitRIOL (ROCALTROL) 0.25 MCG capsule Take 0.25 mcg by mouth daily.  02/08/14   Historical Provider, MD  Cholecalciferol (  VITAMIN D3) 2000 UNITS TABS  04/07/14   Historical Provider, MD  enoxaparin (LOVENOX) 60 MG/0.6ML injection Inject 0.5 mLs (50 mg total) into the skin daily. Patient not taking: Reported on 09/09/2014 04/08/14 04/10/14  Albertine Patricia, MD  MOVIPREP 100 G SOLR  02/13/14   Historical Provider, MD   BP 93/65 mmHg  Pulse 116  Temp(Src) 98.2 F (36.8 C) (Oral)  Resp 20  Ht 5' 3"  (1.6 m)  Wt 109 lb (49.442 kg)  BMI 19.31 kg/m2  SpO2 95%   Physical Exam  Constitutional: She appears well-developed and well-nourished.  HENT:  Head: Normocephalic and atraumatic.  Mouth/Throat: Mucous membranes are dry.  Eyes: Conjunctivae are normal. Right eye exhibits no discharge. Left eye exhibits no discharge.  Neck: Normal range of motion. Neck supple.  Cardiovascular: Regular rhythm and normal heart sounds.  Tachycardia present.   No murmur heard. Pulmonary/Chest: Effort normal and breath sounds normal. No respiratory distress. She has no wheezes. She has no rales.  Abdominal: Soft. There is tenderness (mild, lower bilateral. ). There is no rebound and no guarding.  Genitourinary:  There is an area of extreme tenderness and firmness, ill-defined, lateral to the right aspect  of the anus. No overlying cellulitis or drainage.   Neurological: She is alert.  Skin: Skin is warm and dry.  + tenting of skin  Psychiatric: She has a normal mood and affect.  Nursing note and vitals reviewed.   ED Course  Procedures (including critical care time) Labs Review Labs Reviewed  COMPREHENSIVE METABOLIC PANEL - Abnormal; Notable for the following:    Sodium 133 (*)    CO2 12 (*)    Glucose, Bld 100 (*)    BUN 49 (*)    Creatinine, Ser 3.67 (*)    Albumin 2.5 (*)    Alkaline Phosphatase 127 (*)    GFR calc non Af Amer 12 (*)    GFR calc Af Amer 14 (*)    All other components within normal limits  CBC WITH DIFFERENTIAL/PLATELET - Abnormal; Notable for the following:    WBC 11.5 (*)    RBC 3.60 (*)    Hemoglobin 10.1 (*)    HCT 32.5 (*)    Neutrophils Relative % 78 (*)    Neutro Abs 9.0 (*)    Lymphocytes Relative 11 (*)    Monocytes Absolute 1.2 (*)    All other components within normal limits  PROTIME-INR - Abnormal; Notable for the following:    Prothrombin Time 28.6 (*)    INR 2.67 (*)    All other components within normal limits  URINALYSIS, ROUTINE W REFLEX MICROSCOPIC  I-STAT CG4 LACTIC ACID, ED    Imaging Review Ct Abdomen Pelvis Wo Contrast  09/09/2014   CLINICAL DATA:  H/o crohn's disease, with worsening rectal pain and diarrhea the last few days. Hx: Hysterectomy, Appendectomy, Colon resection  EXAM: CT ABDOMEN AND PELVIS WITHOUT CONTRAST  TECHNIQUE: Multidetector CT imaging of the abdomen and pelvis was performed following the standard protocol without IV contrast.  COMPARISON:  03/29/2014  FINDINGS: There is ill-defined masslike area of heterogeneous soft tissue attenuation that lies to the right of the anus in the peritoneal fat below the levator sling. It measures 5.5 cm x 3.9 cm x 5.3 cm. This is likely an inflammatory mass. It is higher in attenuation in more heterogeneous than expected from an abscess, and also less well-defined. There is  questionable wall thickening in the adjacent anus and rectum.  There  is an ileocolic anastomosis in the anterior mid to low central abdomen. The wall of the colon and adjacent small bowel at the anastomosis is thickened and irregular.  Remainder of the colon and small bowel show normal wall thickness with no adjacent mesenteric inflammation.  Lung bases are essentially clear. Heart is normal in size. Trace amount of pericardial fluid is noted.  Liver and spleen are unremarkable. Gallbladder surgically absent. Mild chronic dilation of common bile duct is stable. Normal pancreas. No adrenal masses.  Mild left renal cortical thinning. Bilateral nonobstructing intrarenal stones. Small low-density left midpole renal cortical lesion consistent with a cyst, stable. No other renal masses. No hydronephrosis. Normal ureters. Bladder is unremarkable.  Uterus is surgically absent.  Atherosclerotic calcifications are noted along the abdominal aorta and iliac arteries. No aneurysm. No pathologically enlarged lymph nodes. No ascites.  Bones are demineralized. There are mild degenerative changes of the visualized spine. No osteoblastic or osteolytic lesions.  IMPRESSION: 1. Irregular wall thickening of the colon and adjacent small bowel at the anterior central to low abdomen ileocolic anastomosis. This suggest active Crohn's disease although there is no significant adjacent mesenteric inflammation. 2. Ill-defined focal heterogeneous soft tissue abnormality below the levator sling on the right adjacent to the right aspect of the anus in lower rectum. This has an inflammatory appearance. It is new since the prior CT. 3. No other evidence of an acute abnormality or of active Crohn's disease. 4. Numerous bilateral nonobstructing intrarenal stones. No ureteral stones or hydronephrosis.   Electronically Signed   By: Lajean Manes M.D.   On: 09/09/2014 09:54     EKG Interpretation None      6:30 AM Patient seen and examined.  Discussed with Dr. Sharol Given. Work-up initiated. Medications ordered.   Vital signs reviewed and are as follows: BP 93/65 mmHg  Pulse 116  Temp(Src) 98.2 F (36.8 C) (Oral)  Resp 20  Ht 5' 3"  (1.6 m)  Wt 109 lb (49.442 kg)  BMI 19.31 kg/m2  SpO2 95%   7:05 AM Patient rechecked. Feels better after fentanyl. Non-CM abd CT pending. BP stable. No SOB. Fluids continuing. HR 100.   9:53 AM Pt stable. Returned from [place]. Pending read.   10:26 AM Spoke with Dr. Verlon Au who will see.   Spoke with Dr. Donne Hazel who is in the OR, they will consult.    MDM   Final diagnoses:  Rectal pain  Crohn's disease, other complication  Dehydration  Nausea vomiting and diarrhea   Admit.     Carlisle Cater, PA-C 09/09/14 [place], MD 09/12/14 1655

## 2014-09-09 NOTE — Progress Notes (Signed)
Notified on-call provider that patient needs orders for SCDs as VTE prophylaxis. Will await new orders and apply to patient once obtained. Will continue to monitor.

## 2014-09-09 NOTE — ED Notes (Signed)
Pt states she has some kidney problem and she is not able to make that much urine. PCP told her she may need HD.

## 2014-09-09 NOTE — ED Notes (Signed)
Pt.cant void at this time

## 2014-09-09 NOTE — H&P (Addendum)
[facility] History and Physical  Natasha [name] DXA:[id number] DOB: 06/18/1945 DOA: 09/09/2014  Referring physician: Jaquita Rector PCP: Ileana Roup, MD  Specialists: Gen surg  69 y/o ? crohn's since 1978 s/p colect/terminal ileal resections [colonoscopy 02/2010 reported wnl, EGD 12/2010 wnl--was scheduled actually for repeat colonoscopy November 2015,]c recurrent flares, COPD, HTN,a/CKD IV, [place], spinal stenosis foll by Neurology, Rh Arthritis? Admission 11/15 = [facility] I, acute LLE DVT, anion gap met acidosis 2/2 CK D, compensated systolic diastolic heart failure  Doing fairly until recently 1/52 ago on 4/3, started to have diarrhea, initial 4-5 episodes daily ? 10-14 episodes, saw PCP, given by mouth Levaquin-around this time also noticed excoriation, "lump" right side proximal Told by PCP that if symptoms got worse may need surgery  ? Size, tendernessSubsequent to Rectal exam by PCP = increasing pain, started to have nausea/vomiting-nonbloody, nonbilious  Took Anusol suppository 4/15 with minimal relief  Because of increasing pain/intractable vomiting decided to come to emergency room    ED workup  Slightly low blood sugars [has constitutional hypotension ] Given fentanyl for pain of "15/10" Current pain level 7/10   Bun/creat- 49/3.6, AG=12, Alk P 127, Alb 2.5 co2 12 Lactate 1.08 WBC 11.5 Hb 10.1 INr 2.67   Review of Systems:  Subjective temp at home - Chest pain, - chills, - blurred vision, - double vision, - unilateral weakness however does feel weak - Dizziness, - rash, - dark stool, - hematemesis, - sinus pain - Shortness of breath, - sputum - other symptoms  Past Medical History  Diagnosis Date  . Chronic kidney disease   . GERD (gastroesophageal reflux disease)   . Crohn's colitis   . CKD (chronic kidney disease) 06/09/2012  . Hypothyroidism 06/10/2012  . Arthritis   . Osteoporosis   . Hypertension   . Plantar warts   . Postmenopausal   .  Glaucoma   . Bulging disc     L3-L4  . Atypical chest pain   . Emphysema/COPD   . Anemia    Past Surgical History  Procedure Laterality Date  . Colon resection    . Esophagogastroduodenoscopy  03/04/2012    Procedure: ESOPHAGOGASTRODUODENOSCOPY (EGD);  Surgeon: Garlan Fair, MD;  Location: [facility];  Service: Endoscopy;  Laterality: N/A;  . Flexible sigmoidoscopy  03/04/2012    Procedure: FLEXIBLE SIGMOIDOSCOPY;  Surgeon: Garlan Fair, MD;  Location: [facility];  Service: Endoscopy;  Laterality: N/A;  . Abdominal hysterectomy    . Appendectomy    . Thyroidectomy     Social History:  History   Social History Narrative   Patient is married [name]) and lives at home with her husband.   Patient has three children.   Patient is retired on disability.   Patient drinks two cups of caffeine daily.   Patient is right-handed.             Allergies  Allergen Reactions  . Mercaptopurine Other (See Comments)    Unknown reaction  . Remicade [Infliximab] Other (See Comments)    Unknown reaction    Family History  Problem Relation Age of Onset  . Hypertension Father   . Heart disease Father   . Heart attack Father   . Diverticulitis Mother   . COPD Mother   . Hypertension Mother   . Heart disease Mother   . Heart attack Mother   . Colon cancer Brother   . Diabetes Brother   . Thyroid disease Brother     Prior to  Admission medications   Medication Sig Start Date End Date Taking? Authorizing Provider  acetaminophen (TYLENOL) 500 MG tablet Take 500 mg by mouth every 6 (six) hours as needed for mild pain.    Yes Historical Provider, MD  albuterol (PROVENTIL HFA;VENTOLIN HFA) 108 (90 BASE) MCG/ACT inhaler Inhale 2 puffs into the lungs every 6 (six) hours as needed for wheezing or shortness of breath.   Yes Historical Provider, MD  calcitRIOL (ROCALTROL) 0.5 MCG capsule Take 0.5 mcg by mouth daily.   Yes Historical Provider, MD  Cholecalciferol (VITAMIN D) 2000  UNITS tablet Take 1 tablet (2,000 Units total) by mouth 3 (three) times daily. Patient taking differently: Take 2,000 Units by mouth daily.  04/07/14  Yes Silver Huguenin Elgergawy, MD  cyanocobalamin (,VITAMIN B-12,) 1000 MCG/ML injection Inject 1,000 mcg into the muscle every 30 (thirty) days. Last injection was 04/28/12   Yes Historical Provider, MD  cyclobenzaprine (FLEXERIL) 10 MG tablet Take 10 mg by mouth as needed for muscle spasms.    Yes Historical Provider, MD  folic acid (FOLVITE) 1 MG tablet Take 1 mg by mouth daily.   Yes Historical Provider, MD  levothyroxine (SYNTHROID, LEVOTHROID) 125 MCG tablet Take 125 mcg by mouth daily.   Yes Historical Provider, MD  MAGNESIUM-OXIDE 400 (241.3 MG) MG tablet Take 400 mg by mouth daily.  04/12/14  Yes Historical Provider, MD  predniSONE (DELTASONE) 5 MG tablet Take 5 mg by mouth daily with breakfast.   Yes Historical Provider, MD  promethazine (PHENERGAN) 25 MG tablet Take 25 mg by mouth every 6 (six) hours as needed for nausea or vomiting.  04/19/14  Yes Historical Provider, MD  QVAR 80 MCG/ACT inhaler Inhale 2 puffs into the lungs as needed (for wheezing).  08/27/13  Yes Historical Provider, MD  sodium bicarbonate 650 MG tablet Take 1 tablet (650 mg total) by mouth 2 (two) times daily. 04/07/14  Yes Albertine Patricia, MD  SPIRIVA HANDIHALER 18 MCG inhalation capsule Place 18 mcg into inhaler and inhale as needed (for COPD).  09/07/13  Yes Historical Provider, MD  traMADol (ULTRAM) 50 MG tablet Take 50 mg by mouth every 6 (six) hours as needed for moderate pain.  03/21/14  Yes Historical Provider, MD  warfarin (COUMADIN) 5 MG tablet Take 1 tablet (5 mg total) by mouth See admin instructions. Please   -take One  and half tablet (7.5 mg) on friday 11/13 at 6 PM,  - take One  and half tablet (7.5 mg) on Saturday 11/14 at 6 PM,  - take One tablet (5 mg) on Sunday   11/15 at 6 PM,  - he will follow with your PCP on Monday morning regarding your INR level, and  your PCP will follow and dose warfarin. Patient taking differently: Take 5-12.5 mg by mouth See admin instructions. Take 59m everyday except 4/17 take 12.58muntil next INR check 04/07/14  Yes DaSilver Hugueninlgergawy, MD  calcitRIOL (ROCALTROL) 0.25 MCG capsule Take 0.25 mcg by mouth daily.  02/08/14   Historical Provider, MD  Cholecalciferol (VITAMIN D3) 2000 UNITS TABS  04/07/14   Historical Provider, MD  enoxaparin (LOVENOX) 60 MG/0.6ML injection Inject 0.5 mLs (50 mg total) into the skin daily. Patient not taking: Reported on 09/09/2014 04/08/14 04/10/14  DaAlbertine PatriciaMD  MOVIPREP 100 G SOLR  02/13/14   Historical Provider, MD   Physical Exam: Filed Vitals:   09/09/14 0815 09/09/14 0830 09/09/14 0915 09/09/14 0939  BP: 98/59 99/53 93/49  103/60  Pulse:  103 102  97  Temp:    98.7 F (37.1 C)  TempSrc:    Oral  Resp:    16  Height:      Weight:      SpO2: 100% 100%  100%    EOMI NCAT no pallor no icterus   neck soft supple Mallampati 0 No thyromegaly however thyroid scar? S1-S2 slightly tachycardic-no murmur rub or gallop Abdomen soft nontender nondistended no rebound Ovoid 7 cm X3 centimeter tender and warm area of abscess in right gluteal fold-no appreciated pus or oozing. Abscess appears to be well loculated Hemorrhoids at 12:00 3:00 positions Rectal deferred given the sense of stiffness of this area and potential need by general surgeon to do rectal anyway  Labs on Admission:  Basic Metabolic Panel:  Recent Labs Lab 09/09/14 0600  NA 133*  K 3.6  CL 109  CO2 12*  GLUCOSE 100*  BUN 49*  CREATININE 3.67*  CALCIUM 9.6   Liver Function Tests:  Recent Labs Lab 09/09/14 0600  AST 15  ALT 12  ALKPHOS 127*  BILITOT 0.4  PROT 6.9  ALBUMIN 2.5*   No results for input(s): LIPASE, AMYLASE in the last 168 hours. No results for input(s): AMMONIA in the last 168 hours. CBC:  Recent Labs Lab 09/09/14 0600  WBC 11.5*  NEUTROABS 9.0*  HGB 10.1*  HCT 32.5*  MCV  90.3  PLT 342   Cardiac Enzymes: No results for input(s): CKTOTAL, CKMB, CKMBINDEX, TROPONINI in the last 168 hours.  BNP (last 3 results) No results for input(s): BNP in the last 8760 hours.  ProBNP (last 3 results) No results for input(s): PROBNP in the last 8760 hours.  CBG: No results for input(s): GLUCAP in the last 168 hours.  Radiological Exams on Admission: Ct Abdomen Pelvis Wo Contrast  09/09/2014   CLINICAL DATA:  H/o crohn's disease, with worsening rectal pain and diarrhea the last few days. Hx: Hysterectomy, Appendectomy, Colon resection  EXAM: CT ABDOMEN AND PELVIS WITHOUT CONTRAST  TECHNIQUE: Multidetector CT imaging of the abdomen and pelvis was performed following the standard protocol without IV contrast.  COMPARISON:  03/29/2014  FINDINGS: There is ill-defined masslike area of heterogeneous soft tissue attenuation that lies to the right of the anus in the peritoneal fat below the levator sling. It measures 5.5 cm x 3.9 cm x 5.3 cm. This is likely an inflammatory mass. It is higher in attenuation in more heterogeneous than expected from an abscess, and also less well-defined. There is questionable wall thickening in the adjacent anus and rectum.  There is an ileocolic anastomosis in the anterior mid to low central abdomen. The wall of the colon and adjacent small bowel at the anastomosis is thickened and irregular.  Remainder of the colon and small bowel show normal wall thickness with no adjacent mesenteric inflammation.  Lung bases are essentially clear. Heart is normal in size. Trace amount of pericardial fluid is noted.  Liver and spleen are unremarkable. Gallbladder surgically absent. Mild chronic dilation of common bile duct is stable. Normal pancreas. No adrenal masses.  Mild left renal cortical thinning. Bilateral nonobstructing intrarenal stones. Small low-density left midpole renal cortical lesion consistent with a cyst, stable. No other renal masses. No hydronephrosis.  Normal ureters. Bladder is unremarkable.  Uterus is surgically absent.  Atherosclerotic calcifications are noted along the abdominal aorta and iliac arteries. No aneurysm. No pathologically enlarged lymph nodes. No ascites.  Bones are demineralized. There are mild degenerative changes of the visualized  spine. No osteoblastic or osteolytic lesions.  IMPRESSION: 1. Irregular wall thickening of the colon and adjacent small bowel at the anterior central to low abdomen ileocolic anastomosis. This suggest active Crohn's disease although there is no significant adjacent mesenteric inflammation. 2. Ill-defined focal heterogeneous soft tissue abnormality below the levator sling on the right adjacent to the right aspect of the anus in lower rectum. This has an inflammatory appearance. It is new since the prior CT. 3. No other evidence of an acute abnormality or of active Crohn's disease. 4. Numerous bilateral nonobstructing intrarenal stones. No ureteral stones or hydronephrosis.   Electronically Signed   By: Lajean Manes M.D.   On: 09/09/2014 09:54    EKG: ADDEND Nsr, Pr 0.12,QRS axis 70, no ST-T inversions or ischemic pattern-borderline criterion for LVH  Principal Problem:   Ischiorectal abscess- potentially will need I&D or at least examination under anesthesia . Deferred to general surgeon who was been consulted by emergency room provider .  Agree with empiric Zosyn  Pain control IV Dilaudid 1-2 mg every 2 when necessary-defer to surgery whether we should use PCA  Please obtain cultures from this area if procedure done Note the INR is therapeutic and therefore might have to wait for this to trend downward -deferred to surgeon whether they want to reverse this with vitamin K. I've placed the patient on IV heparin for prevention of PE as she does have history of acute DVT 02/2014    DVT LLE 03/2014-see above discussion.    Crohn's disease s/p Colectomy, Terminal ilectomies.  Managed by [facility]-patient  appears to have failed mercaptopurine and Remicade-given her infectious state , stress dosed steroids Solu-Cortef . Empiric Zosyn as above will need to wean steroids subsequent to surgery rapidly to promote healing    Metabolic acidosis-bicarbonate 12 , replace with saline cautiously 75 cc per hour with 100 mEq of bicarbonate .     acute superimposed onCKD (chronic kidney disease) stage 4, GFR 15-29 ml/min-no current indication to speak to nephrology however will risk patient is close to dialysis . She will need IV saline    Hypothyroidism-continue Synthroid 75 with sip of water. If not able to tolerate, transition to IV Synthroid 37.5    Spinal stenosis of lumbar region-outpatient management    Echocardiogram = diastolic dysfunction- 7/61/60 = EF 55%, moderate AoV stenosis--IV sodium bicarbonate fluid 100 cc per hour for 12 hours     elevated alkaline phosphatase-unclear etiology. Monitor. Repeat complete metabolic a.m.   Impaired glucose tolerance-blood glucose 100 on be met today. No current indication for sliding scale coverage presently . Might need to add this as I will give her stress dose steroids Cortef    Time spent: 51 Full CODE STATUS Inpatient  General surgery consulted by the emergency room Please call [facility] gastroenterology if needed during this hospital stay  [place] Pager [phone]  If 7PM-7AM, please contact night-coverage [email or web address] Password [facility] 09/09/2014, 10:53 AM          CT abdomen pelvis -5.5 cm x 3.9 cm x 5.3 cm mass No EKG at present time

## 2014-09-09 NOTE — ED Notes (Signed)
Report called to [place] on [facility].

## 2014-09-09 NOTE — ED Notes (Signed)
Pt assisted to [facility]

## 2014-09-10 DIAGNOSIS — E162 Hypoglycemia, unspecified: Secondary | ICD-10-CM | POA: Diagnosis present

## 2014-09-10 DIAGNOSIS — I959 Hypotension, unspecified: Secondary | ICD-10-CM | POA: Diagnosis present

## 2014-09-10 DIAGNOSIS — I82402 Acute embolism and thrombosis of unspecified deep veins of left lower extremity: Secondary | ICD-10-CM

## 2014-09-10 DIAGNOSIS — E876 Hypokalemia: Secondary | ICD-10-CM

## 2014-09-10 LAB — GLUCOSE, CAPILLARY
GLUCOSE-CAPILLARY: 94 mg/dL (ref 70–99)
GLUCOSE-CAPILLARY: 91 mg/dL (ref 70–99)
GLUCOSE-CAPILLARY: 101 mg/dL — AB (ref 70–99)
Glucose-Capillary: 69 mg/dL — ABNORMAL LOW (ref 70–99)
Glucose-Capillary: 72 mg/dL (ref 70–99)
Glucose-Capillary: 83 mg/dL (ref 70–99)

## 2014-09-10 LAB — COMPREHENSIVE METABOLIC PANEL
ALT: 9 U/L (ref 0–35)
AST: 12 U/L (ref 0–37)
Albumin: 1.8 g/dL — ABNORMAL LOW (ref 3.5–5.2)
Alkaline Phosphatase: 93 U/L (ref 39–117)
Anion gap: 9 (ref 5–15)
BUN: 40 mg/dL — ABNORMAL HIGH (ref 6–23)
CO2: 15 mmol/L — ABNORMAL LOW (ref 19–32)
CREATININE: 2.94 mg/dL — AB (ref 0.50–1.10)
Calcium: 8.2 mg/dL — ABNORMAL LOW (ref 8.4–10.5)
Chloride: 114 mmol/L — ABNORMAL HIGH (ref 96–112)
GFR, EST AFRICAN AMERICAN: 18 mL/min — AB (ref >90)
GFR, EST NON AFRICAN AMERICAN: 15 mL/min — AB (ref >90)
Glucose, Bld: 97 mg/dL (ref 70–99)
Potassium: 3.1 mmol/L — ABNORMAL LOW (ref 3.5–5.1)
SODIUM: 138 mmol/L (ref 135–145)
Total Bilirubin: 0.4 mg/dL (ref 0.3–1.2)
Total Protein: 5.2 g/dL — ABNORMAL LOW (ref 6.0–8.3)

## 2014-09-10 LAB — CBC
HCT: 25.4 % — ABNORMAL LOW (ref 36.0–46.0)
HEMOGLOBIN: 8.1 g/dL — AB (ref 12.0–15.0)
MCH: 28.5 pg (ref 26.0–34.0)
MCHC: 31.9 g/dL (ref 30.0–36.0)
MCV: 89.4 fL (ref 78.0–100.0)
Platelets: 304 10*3/uL (ref 150–400)
RBC: 2.84 MIL/uL — AB (ref 3.87–5.11)
RDW: 14.8 % (ref 11.5–15.5)
WBC: 11.8 10*3/uL — AB (ref 4.0–10.5)

## 2014-09-10 LAB — APTT: aPTT: 79 seconds — ABNORMAL HIGH (ref 24–37)

## 2014-09-10 LAB — PROTIME-INR
INR: 3.28 — ABNORMAL HIGH (ref 0.00–1.49)
Prothrombin Time: 33.6 seconds — ABNORMAL HIGH (ref 11.6–15.2)

## 2014-09-10 MED ORDER — CHLORHEXIDINE GLUCONATE 0.12 % MT SOLN
15.0000 mL | Freq: Two times a day (BID) | OROMUCOSAL | Status: DC
  Administered 2014-09-10 – 2014-09-14 (×5): 15 mL via OROMUCOSAL
  Filled 2014-09-10 (×11): qty 15

## 2014-09-10 MED ORDER — PIPERACILLIN-TAZOBACTAM 3.375 G IVPB: 3.3750 g | Freq: Four times a day (QID) | INTRAVENOUS | Status: DC

## 2014-09-10 MED ORDER — PHYTONADIONE 5 MG PO TABS
5.0000 mg | ORAL_TABLET | Freq: Once | ORAL | Status: AC
  Administered 2014-09-10: 5 mg via ORAL
  Filled 2014-09-10: qty 1

## 2014-09-10 MED ORDER — PIPERACILLIN-TAZOBACTAM IN DEX 2-0.25 GM/50ML IV SOLN
2.2500 g | Freq: Three times a day (TID) | INTRAVENOUS | Status: DC
  Administered 2014-09-10 – 2014-09-14 (×13): 2.25 g via INTRAVENOUS
  Filled 2014-09-10 (×14): qty 50

## 2014-09-10 MED ORDER — HYDROMORPHONE HCL 1 MG/ML IJ SOLN
0.5000 mg | Freq: Once | INTRAMUSCULAR | Status: AC
  Administered 2014-09-10: 0.5 mg via INTRAVENOUS
  Filled 2014-09-10: qty 1

## 2014-09-10 MED ORDER — HYDROCODONE-ACETAMINOPHEN 5-325 MG PO TABS
1.0000 | ORAL_TABLET | Freq: Four times a day (QID) | ORAL | Status: DC | PRN
  Administered 2014-09-10 – 2014-09-11 (×5): 1 via ORAL
  Filled 2014-09-10 (×5): qty 1

## 2014-09-10 MED ORDER — CETYLPYRIDINIUM CHLORIDE 0.05 % MT LIQD
7.0000 mL | Freq: Two times a day (BID) | OROMUCOSAL | Status: DC
  Administered 2014-09-12 – 2014-09-13 (×3): 7 mL via OROMUCOSAL

## 2014-09-10 MED ORDER — DEXTROSE 50 % IV SOLN
25.0000 mL | Freq: Once | INTRAVENOUS | Status: AC
  Administered 2014-09-10: 25 mL via INTRAVENOUS

## 2014-09-10 MED ORDER — ACETAMINOPHEN 500 MG PO TABS
500.0000 mg | ORAL_TABLET | Freq: Four times a day (QID) | ORAL | Status: DC | PRN
  Administered 2014-09-12: 500 mg via ORAL
  Filled 2014-09-10: qty 1

## 2014-09-10 MED ORDER — POTASSIUM CHLORIDE 10 MEQ/100ML IV SOLN
10.0000 meq | INTRAVENOUS | Status: AC
  Administered 2014-09-10 (×4): 10 meq via INTRAVENOUS
  Filled 2014-09-10 (×4): qty 100

## 2014-09-10 MED ORDER — HYDROMORPHONE HCL 1 MG/ML IJ SOLN
1.0000 mg | INTRAMUSCULAR | Status: DC | PRN
  Administered 2014-09-11: 1 mg via INTRAVENOUS
  Filled 2014-09-10: qty 1

## 2014-09-10 MED ORDER — FOLIC ACID 1 MG PO TABS
1.0000 mg | ORAL_TABLET | Freq: Every day | ORAL | Status: DC
  Administered 2014-09-10 – 2014-09-14 (×4): 1 mg via ORAL
  Filled 2014-09-10 (×5): qty 1

## 2014-09-10 NOTE — Progress Notes (Signed)
[facility] for Heparin Indication: DVT  Allergies  Allergen Reactions  . Mercaptopurine Other (See Comments)    Unknown reaction  . Remicade [Infliximab] Other (See Comments)    Unknown reaction    Patient Measurements: Height: 5' 3"  (160 cm) Weight: 109 lb (49.442 kg) IBW/kg (Calculated) : 52.4  Vital Signs: Temp: 98.4 F (36.9 C) (04/17 0543) Temp Source: Oral (04/17 0543) BP: 81/50 mmHg (04/17 0543) Pulse Rate: 75 (04/17 0543)  Labs:  Recent Labs  09/09/14 0600 09/10/14 0636  HGB 10.1* 8.1*  HCT 32.5* 25.4*  PLT 342 304  APTT  --  79*  LABPROT 28.6* 33.6*  INR 2.67* 3.28*  CREATININE 3.67* 2.94*    Estimated Creatinine Clearance: 14.3 mL/min (by C-G formula based on Cr of 2.94).  Assessment: 68 year old female presenting with diarrhea, emesis, and rectal pain.  She has a mass near her rectum and anus on CT and was started on Zosyn.  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 Coumadin is being held for possible surgical intervention and she will be bridged with Heparin.  INR is currently 3.28.  Goal of Therapy:  Heparin level 0.3-0.7 units/ml Monitor platelets by anticoagulation protocol: Yes   Plan:  Daily PT/INR - start Heparin when INR approaches 2. Follow up any orders for vitamin K  Rexann Lueras D. Eugene Isadore, PharmD, BCPS Clinical Pharmacist Pager: [phone] 09/10/2014 10:24 AM

## 2014-09-10 NOTE — Progress Notes (Signed)
On-call provider notified of patient's continued hypotension and request for pain medications. Other than pain, no complaints of distress from patient. Will await any new orders and continue to monitor.

## 2014-09-10 NOTE — Progress Notes (Signed)
Subjective: Patient still with perirectal pain - having small bowel movements with some difficulty INR is higher today  Objective: Vital signs in last 24 hours: Temp:  [98.2 F (36.8 C)-100.7 F (38.2 C)] 98.4 F (36.9 C) (04/17 0543) Pulse Rate:  [63-102] 75 (04/17 0543) Resp:  [15-22] 16 (04/17 0543) BP: (81-111)/(49-62) 81/50 mmHg (04/17 0543) SpO2:  [95 %-100 %] 99 % (04/17 0543) Last BM Date: 09/09/14  Intake/Output from previous day: 04/16 0701 - 04/17 0700 In: 2835.8 [I.V.:2835.8] Out: 800 [Urine:800] Intake/Output this shift:    General appearance: alert, cooperative and no distress Rectal - tender to the right side of anus; no obvious fluctuance, but this area does feel thickened  Lab Results:   Recent Labs  09/09/14 0600 09/10/14 0636  WBC 11.5* 11.8*  HGB 10.1* 8.1*  HCT 32.5* 25.4*  PLT 342 304   BMET  Recent Labs  09/09/14 0600 09/10/14 0636  NA 133* 138  K 3.6 3.1*  CL 109 114*  CO2 12* 15*  GLUCOSE 100* 97  BUN 49* 40*  CREATININE 3.67* 2.94*  CALCIUM 9.6 8.2*   PT/INR  Recent Labs  09/09/14 0600 09/10/14 0636  LABPROT 28.6* 33.6*  INR 2.67* 3.28*   ABG No results for input(s): PHART, HCO3 in the last 72 hours.  Invalid input(s): PCO2, PO2  Studies/Results: Ct Abdomen Pelvis Wo Contrast  09/09/2014   CLINICAL DATA:  H/o crohn's disease, with worsening rectal pain and diarrhea the last few days. Hx: Hysterectomy, Appendectomy, Colon resection  EXAM: CT ABDOMEN AND PELVIS WITHOUT CONTRAST  TECHNIQUE: Multidetector CT imaging of the abdomen and pelvis was performed following the standard protocol without IV contrast.  COMPARISON:  03/29/2014  FINDINGS: There is ill-defined masslike area of heterogeneous soft tissue attenuation that lies to the right of the anus in the peritoneal fat below the levator sling. It measures 5.5 cm x 3.9 cm x 5.3 cm. This is likely an inflammatory mass. It is higher in attenuation in more heterogeneous  than expected from an abscess, and also less well-defined. There is questionable wall thickening in the adjacent anus and rectum.  There is an ileocolic anastomosis in the anterior mid to low central abdomen. The wall of the colon and adjacent small bowel at the anastomosis is thickened and irregular.  Remainder of the colon and small bowel show normal wall thickness with no adjacent mesenteric inflammation.  Lung bases are essentially clear. Heart is normal in size. Trace amount of pericardial fluid is noted.  Liver and spleen are unremarkable. Gallbladder surgically absent. Mild chronic dilation of common bile duct is stable. Normal pancreas. No adrenal masses.  Mild left renal cortical thinning. Bilateral nonobstructing intrarenal stones. Small low-density left midpole renal cortical lesion consistent with a cyst, stable. No other renal masses. No hydronephrosis. Normal ureters. Bladder is unremarkable.  Uterus is surgically absent.  Atherosclerotic calcifications are noted along the abdominal aorta and iliac arteries. No aneurysm. No pathologically enlarged lymph nodes. No ascites.  Bones are demineralized. There are mild degenerative changes of the visualized spine. No osteoblastic or osteolytic lesions.  IMPRESSION: 1. Irregular wall thickening of the colon and adjacent small bowel at the anterior central to low abdomen ileocolic anastomosis. This suggest active Crohn's disease although there is no significant adjacent mesenteric inflammation. 2. Ill-defined focal heterogeneous soft tissue abnormality below the levator sling on the right adjacent to the right aspect of the anus in lower rectum. This has an inflammatory appearance. It is new since the  prior CT. 3. No other evidence of an acute abnormality or of active Crohn's disease. 4. Numerous bilateral nonobstructing intrarenal stones. No ureteral stones or hydronephrosis.   Electronically Signed   By: Lajean Manes M.D.   On: 09/09/2014 09:54   Dg Chest  Port 1 View  09/09/2014   CLINICAL DATA:  Ex-smoker.  COPD.  Hypertension.  EXAM: PORTABLE CHEST - 1 VIEW  COMPARISON:  03/29/2014.  FINDINGS: The cardiac silhouette remains borderline enlarged. Clear lungs. Mild diffuse peribronchial thickening with mild progression. Bilateral neck surgical clips are again demonstrated. Mild scoliosis.  IMPRESSION: Mildly progressive chronic bronchitic changes.   Electronically Signed   By: Claudie Revering M.D.   On: 09/09/2014 12:33    Anti-infectives: Anti-infectives    Start     Dose/Rate Route Frequency Ordered Stop   09/10/14 0800  piperacillin-tazobactam (ZOSYN) IVPB 2.25 g     2.25 g 100 mL/hr over 30 Minutes Intravenous Every 8 hours 09/10/14 0739     09/10/14 0745  piperacillin-tazobactam (ZOSYN) IVPB 3.375 g  Status:  Discontinued     3.375 g 12.5 mL/hr over 240 Minutes Intravenous 4 times per day 09/10/14 0735 09/10/14 0738   09/09/14 1015  piperacillin-tazobactam (ZOSYN) IVPB 3.375 g     3.375 g 12.5 mL/hr over 240 Minutes Intravenous  Once 09/09/14 1000 09/09/14 1148      Assessment/Plan: Inflammatory mass - right perirectal space - Crohns disease - possibly active near ileocolic anastomosis Anticoagulation May have clear liquids today/ NPO p MN  Hold Coumadin - consider Vit K.  INR needs to be below 1.5 prior to any surgery.  May use heparin window if needed Continue abx  If no improvement and if INR better tomorrow, will likely need examination under anesthesia.    LOS: 1 day    Sakiyah Shur K. 09/10/2014

## 2014-09-10 NOTE — Progress Notes (Signed)
On-call provider notified of pt's hypotension. No complaints of distress from patient aside from pain.  Orders given for fluid bolus. Will give bolus and recheck pressure. Will continue to monitor.

## 2014-09-10 NOTE — Progress Notes (Addendum)
[facility] PROGRESS NOTE  AMARISS DETAMORE ZHY:[id number] DOB: 1946-03-05 DOA: 09/09/2014 PCP: Ileana Roup, MD  History 69 year old AA female presents with multiple loose bowel movements vomiting and rectal pain. Found to have a perirectal abscess. On Coumadin for previous DVT. Surgery following and will take patient to the OR once INR down.  Assessment/Plan:  Principal Problem:   Ischiorectal abscess: continue zosyn. Will give vitamin K. INR higher. No heparin for now. See below.  Appreciate surgery  Active Problems:   history of Left leg DVT:  5 months ago, sounds like it was provoked, from leg injury after falling.  May be able to stop anticoagulation altogether. Will Doppler leg and if no DVT, no need for anticoagulation/heparin bridge, just DVT prophylaxis. Currently patient is supratherapeutic on Coumadin.  Will get vitamin K today.  Low blood pressure reading: At 5 AM, someone recorded a systolic blood pressure of 80. Unclear whether a physician was called about this. Patient currently mentating fine and appears comfortable. Will recheck stat.  On chronic pred 5 mg. Currently on hydrocortisone 50 bid. If BP remains low will increase and give fluid bolus     Crohn's disease s/p Colectomy, Terminal ilectomies.  [facility] by [facility]. On chronic pred    Metabolic acidosis, chronic, on chronic bicarbonate therapy    Hypokalemia:  Will correct IV    CKD (chronic kidney disease) stage 4, GFR 15-29 ml/min    Hypothyroidism: on synthroid  Hypoglycemia: on dextrose infusion. Now on clears. May be related to adrenal insufficiency. See above.  Code Status:  full Family Communication:  Alert and oriented Disposition Plan:    Consultants:  surgery  Procedures:     Antibiotics:  Zosyn 4/16 --  HPI/Subjective: Complaining of perirectal pain.  Left leg DVT after fall on ice, was in knee immobilizer, crutches previous. No leg pain. No n/v   Objective: Filed  Vitals:   09/10/14 0543  BP: 81/50  Pulse: 75  Temp: 98.4 F (36.9 C)  Resp: 16    Intake/Output Summary (Last 24 hours) at 09/10/14 1132 Last data filed at 09/10/14 1111  Gross per 24 hour  Intake 955.83 ml  Output    800 ml  Net 155.83 ml   Filed Weights   09/09/14 0535  Weight: 49.442 kg (109 lb)    Exam:   General:  A and o. uncomfortable  Cardiovascular: RRR without MGR  Respiratory: CTA without WRR  Abdomen: S, NT, ND  Rectal: no DRE done. Left buttock indurated and tender  Ext: no CCE, no calf tenderness. homan sign negative.  Basic Metabolic Panel:  Recent Labs Lab 09/09/14 0600 09/10/14 0636  NA 133* 138  K 3.6 3.1*  CL 109 114*  CO2 12* 15*  GLUCOSE 100* 97  BUN 49* 40*  CREATININE 3.67* 2.94*  CALCIUM 9.6 8.2*   Liver Function Tests:  Recent Labs Lab 09/09/14 0600 09/10/14 0636  AST 15 12  ALT 12 9  ALKPHOS 127* 93  BILITOT 0.4 0.4  PROT 6.9 5.2*  ALBUMIN 2.5* 1.8*   No results for input(s): LIPASE, AMYLASE in the last 168 hours. No results for input(s): AMMONIA in the last 168 hours. CBC:  Recent Labs Lab 09/09/14 0600 09/10/14 0636  WBC 11.5* 11.8*  NEUTROABS 9.0*  --   HGB 10.1* 8.1*  HCT 32.5* 25.4*  MCV 90.3 89.4  PLT 342 304   Cardiac Enzymes: No results for input(s): CKTOTAL, CKMB, CKMBINDEX, TROPONINI in the last 168 hours.  BNP (last 3 results) No results for input(s): BNP in the last 8760 hours.  ProBNP (last 3 results) No results for input(s): PROBNP in the last 8760 hours.  CBG:  Recent Labs Lab 09/09/14 2116 09/09/14 2252 09/10/14 0010 09/10/14 0402 09/10/14 0824  GLUCAP 67* 105* 69* 94 72    No results found for this or any previous visit (from the past 240 hour(s)).   Studies: Ct Abdomen Pelvis Wo Contrast  09/09/2014   CLINICAL DATA:  H/o crohn's disease, with worsening rectal pain and diarrhea the last few days. Hx: Hysterectomy, Appendectomy, Colon resection  EXAM: CT ABDOMEN AND PELVIS  WITHOUT CONTRAST  TECHNIQUE: Multidetector CT imaging of the abdomen and pelvis was performed following the standard protocol without IV contrast.  COMPARISON:  03/29/2014  FINDINGS: There is ill-defined masslike area of heterogeneous soft tissue attenuation that lies to the right of the anus in the peritoneal fat below the levator sling. It measures 5.5 cm x 3.9 cm x 5.3 cm. This is likely an inflammatory mass. It is higher in attenuation in more heterogeneous than expected from an abscess, and also less well-defined. There is questionable wall thickening in the adjacent anus and rectum.  There is an ileocolic anastomosis in the anterior mid to low central abdomen. The wall of the colon and adjacent small bowel at the anastomosis is thickened and irregular.  Remainder of the colon and small bowel show normal wall thickness with no adjacent mesenteric inflammation.  Lung bases are essentially clear. Heart is normal in size. Trace amount of pericardial fluid is noted.  Liver and spleen are unremarkable. Gallbladder surgically absent. Mild chronic dilation of common bile duct is stable. Normal pancreas. No adrenal masses.  Mild left renal cortical thinning. Bilateral nonobstructing intrarenal stones. Small low-density left midpole renal cortical lesion consistent with a cyst, stable. No other renal masses. No hydronephrosis. Normal ureters. Bladder is unremarkable.  Uterus is surgically absent.  Atherosclerotic calcifications are noted along the abdominal aorta and iliac arteries. No aneurysm. No pathologically enlarged lymph nodes. No ascites.  Bones are demineralized. There are mild degenerative changes of the visualized spine. No osteoblastic or osteolytic lesions.  IMPRESSION: 1. Irregular wall thickening of the colon and adjacent small bowel at the anterior central to low abdomen ileocolic anastomosis. This suggest active Crohn's disease although there is no significant adjacent mesenteric inflammation. 2.  Ill-defined focal heterogeneous soft tissue abnormality below the levator sling on the right adjacent to the right aspect of the anus in lower rectum. This has an inflammatory appearance. It is new since the prior CT. 3. No other evidence of an acute abnormality or of active Crohn's disease. 4. Numerous bilateral nonobstructing intrarenal stones. No ureteral stones or hydronephrosis.   Electronically Signed   By: Lajean Manes M.D.   On: 09/09/2014 09:54   Dg Chest Port 1 View  09/09/2014   CLINICAL DATA:  Ex-smoker.  COPD.  Hypertension.  EXAM: PORTABLE CHEST - 1 VIEW  COMPARISON:  03/29/2014.  FINDINGS: The cardiac silhouette remains borderline enlarged. Clear lungs. Mild diffuse peribronchial thickening with mild progression. Bilateral neck surgical clips are again demonstrated. Mild scoliosis.  IMPRESSION: Mildly progressive chronic bronchitic changes.   Electronically Signed   By: Claudie Revering M.D.   On: 09/09/2014 12:33    Scheduled Meds: . antiseptic oral rinse  7 mL Mouth Rinse q12n4p  . calcitRIOL  0.5 mcg Oral Daily  . chlorhexidine  15 mL Mouth Rinse BID  . folic acid  1 mg Oral Daily  . hydrocortisone sod succinate (SOLU-CORTEF) inj  50 mg Intravenous Q12H  . levothyroxine  125 mcg Oral QAC breakfast  . phytonadione  5 mg Oral Once  . piperacillin-tazobactam (ZOSYN)  IV  2.25 g Intravenous Q8H  . sodium bicarbonate  650 mg Oral BID  . sodium chloride  3 mL Intravenous Q12H   Continuous Infusions: . dextrose 50 mL/hr at 09/09/14 2317  .  sodium bicarbonate 150 mEq in sterile water 1000 mL infusion 50 mL/hr at 09/10/14 0500    Time spent: 35 minutes  [place] [facility]  [email or web address], password [facility] 09/10/2014, 11:32 AM  LOS: 1 day

## 2014-09-10 NOTE — Progress Notes (Signed)
MD Conley Canal paged, patient K+ 3.1 asked for K+ supplementation.

## 2014-09-11 DIAGNOSIS — N184 Chronic kidney disease, stage 4 (severe): Secondary | ICD-10-CM

## 2014-09-11 DIAGNOSIS — M79604 Pain in right leg: Secondary | ICD-10-CM

## 2014-09-11 LAB — BASIC METABOLIC PANEL
ANION GAP: 11 (ref 5–15)
BUN: 31 mg/dL — AB (ref 6–23)
CO2: 20 mmol/L (ref 19–32)
Calcium: 8.3 mg/dL — ABNORMAL LOW (ref 8.4–10.5)
Chloride: 104 mmol/L (ref 96–112)
Creatinine, Ser: 2.63 mg/dL — ABNORMAL HIGH (ref 0.50–1.10)
GFR calc Af Amer: 20 mL/min — ABNORMAL LOW (ref >90)
GFR calc non Af Amer: 18 mL/min — ABNORMAL LOW (ref >90)
Glucose, Bld: 119 mg/dL — ABNORMAL HIGH (ref 70–99)
Potassium: 3 mmol/L — ABNORMAL LOW (ref 3.5–5.1)
Sodium: 135 mmol/L (ref 135–145)

## 2014-09-11 LAB — CBC
HCT: 26.1 % — ABNORMAL LOW (ref 36.0–46.0)
HCT: 26.4 % — ABNORMAL LOW (ref 36.0–46.0)
HEMOGLOBIN: 8.4 g/dL — AB (ref 12.0–15.0)
Hemoglobin: 8.5 g/dL — ABNORMAL LOW (ref 12.0–15.0)
MCH: 28.8 pg (ref 26.0–34.0)
MCH: 28.4 pg (ref 26.0–34.0)
MCHC: 32.2 g/dL (ref 30.0–36.0)
MCHC: 32.2 g/dL (ref 30.0–36.0)
MCV: 89.4 fL (ref 78.0–100.0)
MCV: 88.3 fL (ref 78.0–100.0)
PLATELETS: 322 10*3/uL (ref 150–400)
PLATELETS: 317 10*3/uL (ref 150–400)
RBC: 2.92 MIL/uL — ABNORMAL LOW (ref 3.87–5.11)
RBC: 2.99 MIL/uL — AB (ref 3.87–5.11)
RDW: 14.6 % (ref 11.5–15.5)
RDW: 14.5 % (ref 11.5–15.5)
WBC: 13.8 10*3/uL — ABNORMAL HIGH (ref 4.0–10.5)
WBC: 14.8 10*3/uL — AB (ref 4.0–10.5)

## 2014-09-11 LAB — PROTIME-INR
INR: 2.49 — AB (ref 0.00–1.49)
Prothrombin Time: 27.1 seconds — ABNORMAL HIGH (ref 11.6–15.2)

## 2014-09-11 LAB — TYPE AND SCREEN
ABO/RH(D): O POS
ANTIBODY SCREEN: NEGATIVE

## 2014-09-11 LAB — GLUCOSE, CAPILLARY
GLUCOSE-CAPILLARY: 104 mg/dL — AB (ref 70–99)
GLUCOSE-CAPILLARY: 87 mg/dL (ref 70–99)
Glucose-Capillary: 98 mg/dL (ref 70–99)
Glucose-Capillary: 116 mg/dL — ABNORMAL HIGH (ref 70–99)
Glucose-Capillary: 183 mg/dL — ABNORMAL HIGH (ref 70–99)
Glucose-Capillary: 136 mg/dL — ABNORMAL HIGH (ref 70–99)

## 2014-09-11 LAB — MAGNESIUM: MAGNESIUM: 1.5 mg/dL (ref 1.5–2.5)

## 2014-09-11 LAB — ABO/RH: ABO/RH(D): O POS

## 2014-09-11 MED ORDER — PRO-STAT SUGAR FREE PO LIQD
30.0000 mL | Freq: Three times a day (TID) | ORAL | Status: DC
  Administered 2014-09-11 – 2014-09-13 (×3): 30 mL via ORAL
  Filled 2014-09-11 (×11): qty 30

## 2014-09-11 MED ORDER — HYDROMORPHONE HCL 1 MG/ML IJ SOLN
1.0000 mg | INTRAMUSCULAR | Status: DC | PRN
Start: 2014-09-11 — End: 2014-09-12
  Administered 2014-09-11 – 2014-09-12 (×4): 1 mg via INTRAVENOUS
  Filled 2014-09-11 (×4): qty 1

## 2014-09-11 MED ORDER — PHYTONADIONE 5 MG PO TABS
5.0000 mg | ORAL_TABLET | Freq: Once | ORAL | Status: AC
  Administered 2014-09-11: 5 mg via ORAL
  Filled 2014-09-11: qty 1

## 2014-09-11 MED ORDER — POTASSIUM CHLORIDE CRYS ER 20 MEQ PO TBCR
40.0000 meq | EXTENDED_RELEASE_TABLET | ORAL | Status: AC
  Administered 2014-09-11 (×3): 40 meq via ORAL
  Filled 2014-09-11 (×3): qty 2

## 2014-09-11 NOTE — Progress Notes (Signed)
Utilization review complete 

## 2014-09-11 NOTE — Progress Notes (Signed)
INITIAL NUTRITION ASSESSMENT  DOCUMENTATION CODES Per approved criteria  -Non-severe (moderate) malnutrition in the context of acute illness or injury   Pt meets criteria for moderate MALNUTRITION in the context of acute illness as evidenced by mild fat and muscle depletion.  INTERVENTION: -30 ml Prostat TID, each supplement provides 100 kcals and 15 grams protein  NUTRITION DIAGNOSIS: Malnutrition related to altered GI function as evidenced by mild fat and muscle depletion.   Goal: Pt will meet >90% of estimated nutritional needs  Monitor:  PO/supplement intake, labs, weight changes, I/O's  Reason for Assessment: MST=3  69 y.o. female  Admitting Dx: Ischiorectal abscess  69 y/o ? crohn's since 1978 s/p colect/terminal ileal resections [colonoscopy 02/2010 reported wnl, EGD 12/2010 wnl--was scheduled actually for repeat colonoscopy November 2015,]c recurrent flares, COPD, HTN,a/CKD IV, [place], spinal stenosis foll by Neurology, Rh Arthritis? Admission 11/15 = AK I, acute LLE DVT, anion gap met acidosis 2/2 CK D, compensated systolic diastolic heart failure  ASSESSMENT: Pt admitted with ischiorectal abscess.  Hx obtained by pt at bedside. She reveals that she has had poor po intake for the past 1.5-2 weeks due to excessive diarrhea. She reveals that she tried to eat food, but "everything would run right through me". She is very optimistic, as she was able to tolerate 100% of her soft diet at lunch and produced a formed BM. She reveals that she is very vigilant about her food choices at home due to crohn's disease. She expressed frustration about poor supplement tolerance; she elaborates that she has tried Ensure and Boost in the past, but she suspects that they increased the frequency of her diarrhea and multiple physicians encouraged her not to drink it due to the magnesium content. She is amenable to try Prostat.  She reveals wt loss over the past year, however, he reports is not  consistent with weight hx. She reveals UBW between 128-135#, which she last weighed one year ago. She reports that her clothes are very loose and has dropped two clothing sizes.  Discussed importance of good meal and supplement intake to promote healing. Pt also expressed she might want to transition to [employer] after her surgery.  Labs reviewed. K: 3.0 (on supplement), BUN/Creat: 31/2.63, Calcium: 8.3, Glucose: 119. CBGS: 87-116.   Nutrition Focused Physical Exam:  Subcutaneous Fat:  Orbital Region: mild depletion Upper Arm Region: mild depletion Thoracic and Lumbar Region: WDL  Muscle:  Temple Region: mild depletion Clavicle Bone Region: mild depletion Clavicle and Acromion Bone Region: mild depletion Scapular Bone Region: mild depletion Dorsal Hand: mild depletion Patellar Region: mild depletion Anterior Thigh Region: mild depletion Posterior Calf Region: mild depletion  Edema: none present  Height: Ht Readings from Last 1 Encounters:  09/09/14 5' 3"  (1.6 m)    Weight: Wt Readings from Last 1 Encounters:  09/09/14 109 lb (49.442 kg)    Ideal Body Weight: 115#  % Ideal Body Weight: 95%  Wt Readings from Last 10 Encounters:  09/09/14 109 lb (49.442 kg)  05/05/14 109 lb (49.442 kg)  04/05/14 115 lb (52.164 kg)  03/03/14 110 lb (49.896 kg)  11/01/13 113 lb (51.256 kg)  09/28/13 120 lb (54.432 kg)  09/20/13 119 lb (53.978 kg)  05/18/13 118 lb 6.4 oz (53.706 kg)  06/14/12 150 lb 12.7 oz (68.4 kg)  03/04/12 127 lb (57.607 kg)    Usual Body Weight: 130#  % Usual Body Weight: 84%  BMI:  Body mass index is 19.31 kg/(m^2). Normal weight  Estimated Nutritional  Needs: Kcal: 1500-1700 Protein: 65-75 grams Fluid: 1.5-1.7 L  Skin: WDL  Diet Order: DIET SOFT Room service appropriate?: Yes; Fluid consistency:: Thin Diet NPO time specified Except for: Sips with Meds  EDUCATION NEEDS: -Education needs addressed   Intake/Output Summary (Last 24 hours) at  09/11/14 1424 Last data filed at 09/11/14 0923  Gross per 24 hour  Intake  887.5 ml  Output    601 ml  Net  286.5 ml    Last BM: 09/10/14  Labs:   Recent Labs Lab 09/09/14 0600 09/10/14 0636 09/11/14 0725  NA 133* 138 135  K 3.6 3.1* 3.0*  CL 109 114* 104  CO2 12* 15* 20  BUN 49* 40* 31*  CREATININE 3.67* 2.94* 2.63*  CALCIUM 9.6 8.2* 8.3*  MG  --   --  1.5  GLUCOSE 100* 97 119*    CBG (last 3)   Recent Labs  09/11/14 0404 09/11/14 0754 09/11/14 1148  GLUCAP 116* 104* 87    Scheduled Meds: . antiseptic oral rinse  7 mL Mouth Rinse q12n4p  . calcitRIOL  0.5 mcg Oral Daily  . chlorhexidine  15 mL Mouth Rinse BID  . folic acid  1 mg Oral Daily  . hydrocortisone sod succinate (SOLU-CORTEF) inj  50 mg Intravenous Q12H  . levothyroxine  125 mcg Oral QAC breakfast  . piperacillin-tazobactam (ZOSYN)  IV  2.25 g Intravenous Q8H  . potassium chloride  40 mEq Oral Q4H  . sodium bicarbonate  650 mg Oral BID  . sodium chloride  3 mL Intravenous Q12H    Continuous Infusions: . dextrose 50 mL/hr at 09/11/14 0010  .  sodium bicarbonate 150 mEq in sterile water 1000 mL infusion 50 mL/hr at 09/11/14 3846    Past Medical History  Diagnosis Date  . Chronic kidney disease   . GERD (gastroesophageal reflux disease)   . Crohn's colitis   . CKD (chronic kidney disease) 06/09/2012  . Hypothyroidism 06/10/2012  . Arthritis   . Osteoporosis   . Hypertension   . Plantar warts   . Postmenopausal   . Glaucoma   . Bulging disc     L3-L4  . Atypical chest pain   . Emphysema/COPD   . Anemia     Past Surgical History  Procedure Laterality Date  . Colon resection    . Esophagogastroduodenoscopy  03/04/2012    Procedure: ESOPHAGOGASTRODUODENOSCOPY (EGD);  Surgeon: Garlan Fair, MD;  Location: [facility];  Service: Endoscopy;  Laterality: N/A;  . Flexible sigmoidoscopy  03/04/2012    Procedure: FLEXIBLE SIGMOIDOSCOPY;  Surgeon: Garlan Fair, MD;  Location: [facility]  ENDOSCOPY;  Service: Endoscopy;  Laterality: N/A;  . Abdominal hysterectomy    . Appendectomy    . Thyroidectomy      Meily Glowacki A. Jimmye Norman, RD, LDN, CDE Pager: [phone] After hours Pager: [phone]

## 2014-09-11 NOTE — Progress Notes (Addendum)
*  PRELIMINARY RESULTS* Vascular Ultrasound Left lower extremity venous duplex has been completed.  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 No evidence of Acute DVT.    Landry Mellow, RDMS, RVT  09/11/2014, 8:56 AM

## 2014-09-11 NOTE — Clinical Documentation Improvement (Signed)
Labs this admission as below.  Anemia is noted in Past Medical History section grid.  Please document any clinical conditions present this admission associated with the abnormal labs, if any, and document in your progress note and carry over to the discharge summary.    Component      RBC Hemoglobin HCT  Latest Ref Rng      3.87 - 5.11 MIL/uL 12.0 - 15.0 g/dL 36.0 - 46.0 %  09/09/2014     6:00 AM 3.60 (L) 10.1 (L) 32.5 (L)  09/10/2014     6:36 AM 2.84 (L) 8.1 (L) 25.4 (L)  09/11/2014     7:25 AM 2.92 (L) 8.4 (L) 26.1 (L)   Possible Clinical Conditions: -Anemia (if present, please document acuity and type) -Other condition (please specify) -Unable to determine at present  Thank you, Mateo Flow, RN [phone] Clinical Documentation Specialist

## 2014-09-11 NOTE — Progress Notes (Addendum)
[facility] PROGRESS NOTE  SHELMA EIBEN GTX:[id number] DOB: [birth year] DOA: 09/09/2014 PCP: Ileana Roup, MD  History 69 year old AA female presents with multiple loose bowel movements vomiting and rectal pain. Found to have a perirectal abscess. On Coumadin for previous DVT. Surgery following and will take patient to the OR once INR down.  Assessment/Plan:    Ischiorectal abscess: continue zosyn.  vitamin K again, plan for surgery in AM if INR < 1.5     history of Left leg DVT:  5 months ago, sounds like it was provoked, from leg injury after falling.   Duplex shows chronic DVT,, spoke with vascular- can stop anticoagulation as treated for 5 months  Low blood pressure reading:  Patient currently mentating fine and appears comfortable. Will recheck stat.  On chronic pred 5 mg. Currently on hydrocortisone 50 bid. If BP remains low will increase and give fluid bolus     Crohn's disease s/p Colectomy, Terminal ilectomies.  Managed by [facility]. On chronic pred  Anemia of CD    Metabolic acidosis, chronic, on chronic bicarbonate therapy    Hypokalemia:  Will correct -check Mg    CKD (chronic kidney disease) stage 4, GFR 15-29 ml/min    Hypothyroidism: on synthroid  Hypoglycemia: on dextrose infusion. Now on clears. May be related to adrenal insufficiency. See above.  Code Status:  full Family Communication:  Alert and oriented Disposition Plan:    Consultants:  surgery  Procedures:     Antibiotics:  Zosyn 4/16 --  HPI/Subjective: Still with severe pain on bottom  Objective: Filed Vitals:   09/11/14 0408  BP: 91/47  Pulse: 73  Temp: 98.6 F (37 C)  Resp: 18    Intake/Output Summary (Last 24 hours) at 09/11/14 1129 Last data filed at 09/11/14 2482  Gross per 24 hour  Intake 1607.5 ml  Output    601 ml  Net 1006.5 ml   Filed Weights   09/09/14 0535  Weight: 49.442 kg (109 lb)    Exam:   General:  Awake, mildly  uncomfortable  Cardiovascular: RRR without MGR  Respiratory: CTA without WRR  Abdomen: S, NT, ND  Skin: Left buttock indurated and tender  Ext: no CCE  Basic Metabolic Panel:  Recent Labs Lab 09/09/14 0600 09/10/14 0636 09/11/14 0725  NA 133* 138 135  K 3.6 3.1* 3.0*  CL 109 114* 104  CO2 12* 15* 20  GLUCOSE 100* 97 119*  BUN 49* 40* 31*  CREATININE 3.67* 2.94* 2.63*  CALCIUM 9.6 8.2* 8.3*   Liver Function Tests:  Recent Labs Lab 09/09/14 0600 09/10/14 0636  AST 15 12  ALT 12 9  ALKPHOS 127* 93  BILITOT 0.4 0.4  PROT 6.9 5.2*  ALBUMIN 2.5* 1.8*   No results for input(s): LIPASE, AMYLASE in the last 168 hours. No results for input(s): AMMONIA in the last 168 hours. CBC:  Recent Labs Lab 09/09/14 0600 09/10/14 0636 09/11/14 0725  WBC 11.5* 11.8* 13.8*  NEUTROABS 9.0*  --   --   HGB 10.1* 8.1* 8.4*  HCT 32.5* 25.4* 26.1*  MCV 90.3 89.4 89.4  PLT 342 304 322   Cardiac Enzymes: No results for input(s): CKTOTAL, CKMB, CKMBINDEX, TROPONINI in the last 168 hours. BNP (last 3 results) No results for input(s): BNP in the last 8760 hours.  ProBNP (last 3 results) No results for input(s): PROBNP in the last 8760 hours.  CBG:  Recent Labs Lab 09/10/14 1746 09/10/14 2154 09/11/14 0104 09/11/14 0404 09/11/14  0754  GLUCAP 101* 83 98 116* 104*    No results found for this or any previous visit (from the past 240 hour(s)).   Studies: Dg Chest Port 1 View  09/09/2014   CLINICAL DATA:  Ex-smoker.  COPD.  Hypertension.  EXAM: PORTABLE CHEST - 1 VIEW  COMPARISON:  03/29/2014.  FINDINGS: The cardiac silhouette remains borderline enlarged. Clear lungs. Mild diffuse peribronchial thickening with mild progression. Bilateral neck surgical clips are again demonstrated. Mild scoliosis.  IMPRESSION: Mildly progressive chronic bronchitic changes.   Electronically Signed   By: Claudie Revering M.D.   On: 09/09/2014 12:33    Scheduled Meds: . antiseptic oral rinse  7  mL Mouth Rinse q12n4p  . calcitRIOL  0.5 mcg Oral Daily  . chlorhexidine  15 mL Mouth Rinse BID  . folic acid  1 mg Oral Daily  . hydrocortisone sod succinate (SOLU-CORTEF) inj  50 mg Intravenous Q12H  . levothyroxine  125 mcg Oral QAC breakfast  . phytonadione  5 mg Oral Once  . piperacillin-tazobactam (ZOSYN)  IV  2.25 g Intravenous Q8H  . potassium chloride  40 mEq Oral Q4H  . sodium bicarbonate  650 mg Oral BID  . sodium chloride  3 mL Intravenous Q12H   Continuous Infusions: . dextrose 50 mL/hr at 09/11/14 0010  .  sodium bicarbonate 150 mEq in sterile water 1000 mL infusion 50 mL/hr at 09/11/14 0524    Time spent: 25 minutes  Kymani Laursen  [facility]  [email or web address], password [facility] 09/11/2014, 11:29 AM  LOS: 2 days

## 2014-09-11 NOTE — Care Management Note (Addendum)
    Page 1 of 1   09/14/2014     2:17:10 PM CARE MANAGEMENT NOTE 09/14/2014  Patient:  SAUSHA, RAYMOND   Account Number:  [id number]  Date Initiated:  09/11/2014  Documentation initiated by:  Tomi Bamberger  Subjective/Objective Assessment:   dx ischiorectal abscess  admit- lives with spouse     Action/Plan:   clears  u/s today 4/18  awaiting inr to be therapuetic   Anticipated DC Date:  09/13/2014   Anticipated DC Plan:  [place]  CM consult      Choice offered to / List presented to:             Status of service:  Completed, signed off Medicare Important Message given?  YES (If response is "NO", the following Medicare IM given date fields will be blank) Date Medicare IM given:  09/12/2014 Medicare IM given by:  Carles Collet Date Additional Medicare IM given:   Additional Medicare IM given by:    Discharge Disposition:  HOME/SELF CARE  Per UR Regulation:  Reviewed for med. necessity/level of care/duration of stay  If discussed at [place] of Stay Meetings, dates discussed:    Comments:  09/11/14 [place], BSN [phone] patient having diarrhea, c/o pain, trying to  progress diet.  NCM will cont to follow for dc needs.

## 2014-09-11 NOTE — Progress Notes (Signed)
[facility] Progress Note     Subjective: Pt still has a lot of pain on her right peri-rectal area.  Hard to sit or lay on her right side.  She says her BP is very low and she can't get pain meds as often as she would like.  She's currently NPO.  She just got back from a doppler of her LE - concern for DVT.    Objective: Vital signs in last 24 hours: Temp:  [97.9 F (36.6 C)-98.6 F (37 C)] 98.6 F (37 C) (04/18 0408) Pulse Rate:  [73-76] 73 (04/18 0408) Resp:  [8-24] 18 (04/18 0408) BP: (91-103)/(47-65) 91/47 mmHg (04/18 0408) SpO2:  [96 %-100 %] 96 % (04/18 0408) Last BM Date: 09/10/14  Intake/Output from previous day: 04/17 0701 - 04/18 0700 In: 2086.7 [P.O.:240; I.V.:1346.7; IV Piggyback:500] Out: 901 [Urine:900; Stool:1] Intake/Output this shift:    PE: Gen:  Alert, NAD, pleasant Card:  RRR, no M/G/R heard Pulm:  CTA, no W/R/R Abd: Soft, NT/ND, +BS, no HSM Rectum:  Indurated without erythema over right perirectal/buttock area   Lab Results:   Recent Labs  09/09/14 0600 09/10/14 0636  WBC 11.5* 11.8*  HGB 10.1* 8.1*  HCT 32.5* 25.4*  PLT 342 304   BMET  Recent Labs  09/09/14 0600 09/10/14 0636  NA 133* 138  K 3.6 3.1*  CL 109 114*  CO2 12* 15*  GLUCOSE 100* 97  BUN 49* 40*  CREATININE 3.67* 2.94*  CALCIUM 9.6 8.2*   PT/INR  Recent Labs  09/09/14 0600 09/10/14 0636  LABPROT 28.6* 33.6*  INR 2.67* 3.28*   CMP     Component Value Date/Time   NA 138 09/10/2014 0636   K 3.1* 09/10/2014 0636   CL 114* 09/10/2014 0636   CO2 15* 09/10/2014 0636   GLUCOSE 97 09/10/2014 0636   BUN 40* 09/10/2014 0636   CREATININE 2.94* 09/10/2014 0636   CALCIUM 8.2* 09/10/2014 0636   CALCIUM 5.9* 03/29/2014 2304   PROT 5.2* 09/10/2014 0636   PROT 6.6 09/28/2013 1059   ALBUMIN 1.8* 09/10/2014 0636   AST 12 09/10/2014 0636   ALT 9 09/10/2014 0636   ALKPHOS 93 09/10/2014 0636   BILITOT 0.4 09/10/2014 0636   GFRNONAA 15* 09/10/2014 0636    GFRAA 18* 09/10/2014 0636   Lipase     Component Value Date/Time   LIPASE 42 04/04/2014 0547       Studies/Results: Ct Abdomen Pelvis Wo Contrast  09/09/2014   CLINICAL DATA:  H/o crohn's disease, with worsening rectal pain and diarrhea the last few days. Hx: Hysterectomy, Appendectomy, Colon resection  EXAM: CT ABDOMEN AND PELVIS WITHOUT CONTRAST  TECHNIQUE: Multidetector CT imaging of the abdomen and pelvis was performed following the standard protocol without IV contrast.  COMPARISON:  03/29/2014  FINDINGS: There is ill-defined masslike area of heterogeneous soft tissue attenuation that lies to the right of the anus in the peritoneal fat below the levator sling. It measures 5.5 cm x 3.9 cm x 5.3 cm. This is likely an inflammatory mass. It is higher in attenuation in more heterogeneous than expected from an abscess, and also less well-defined. There is questionable wall thickening in the adjacent anus and rectum.  There is an ileocolic anastomosis in the anterior mid to low central abdomen. The wall of the colon and adjacent small bowel at the anastomosis is thickened and irregular.  Remainder of the colon and small bowel show normal wall thickness with no adjacent mesenteric inflammation.  Lung bases are essentially clear. Heart is normal in size. Trace amount of pericardial fluid is noted.  Liver and spleen are unremarkable. Gallbladder surgically absent. Mild chronic dilation of common bile duct is stable. Normal pancreas. No adrenal masses.  Mild left renal cortical thinning. Bilateral nonobstructing intrarenal stones. Small low-density left midpole renal cortical lesion consistent with a cyst, stable. No other renal masses. No hydronephrosis. Normal ureters. Bladder is unremarkable.  Uterus is surgically absent.  Atherosclerotic calcifications are noted along the abdominal aorta and iliac arteries. No aneurysm. No pathologically enlarged lymph nodes. No ascites.  Bones are demineralized. There  are mild degenerative changes of the visualized spine. No osteoblastic or osteolytic lesions.  IMPRESSION: 1. Irregular wall thickening of the colon and adjacent small bowel at the anterior central to low abdomen ileocolic anastomosis. This suggest active Crohn's disease although there is no significant adjacent mesenteric inflammation. 2. Ill-defined focal heterogeneous soft tissue abnormality below the levator sling on the right adjacent to the right aspect of the anus in lower rectum. This has an inflammatory appearance. It is new since the prior CT. 3. No other evidence of an acute abnormality or of active Crohn's disease. 4. Numerous bilateral nonobstructing intrarenal stones. No ureteral stones or hydronephrosis.   Electronically Signed   By: Lajean Manes M.D.   On: 09/09/2014 09:54   Dg Chest Port 1 View  09/09/2014   CLINICAL DATA:  Ex-smoker.  COPD.  Hypertension.  EXAM: PORTABLE CHEST - 1 VIEW  COMPARISON:  03/29/2014.  FINDINGS: The cardiac silhouette remains borderline enlarged. Clear lungs. Mild diffuse peribronchial thickening with mild progression. Bilateral neck surgical clips are again demonstrated. Mild scoliosis.  IMPRESSION: Mildly progressive chronic bronchitic changes.   Electronically Signed   By: Claudie Revering M.D.   On: 09/09/2014 12:33    Anti-infectives: Anti-infectives    Start     Dose/Rate Route Frequency Ordered Stop   09/10/14 0800  piperacillin-tazobactam (ZOSYN) IVPB 2.25 g     2.25 g 100 mL/hr over 30 Minutes Intravenous Every 8 hours 09/10/14 0739     09/10/14 0745  piperacillin-tazobactam (ZOSYN) IVPB 3.375 g  Status:  Discontinued     3.375 g 12.5 mL/hr over 240 Minutes Intravenous 4 times per day 09/10/14 0735 09/10/14 0738   09/09/14 1015  piperacillin-tazobactam (ZOSYN) IVPB 3.375 g     3.375 g 12.5 mL/hr over 240 Minutes Intravenous  Once 09/09/14 1000 09/09/14 1148       Assessment/Plan Inflammatory process at right anus/rectum Right perirectal  abscess -Soft diet then NPO p MN  -Needs exam under anesthesia and likely I&D -Hold Coumadin, needs to be reversed.  Would ask primary to address this so that her INR is below1.5 prior surgery ?tomorrow. Likely needs to be done in a heparin window (hold heparin 6 hours prior to procedure) -Continue abx (Zosyn Day #2)  Crohns disease - possibly active near ileocolic anastomosis Anticoagulation    LOS: 2 days    DORT, Shontelle Muska 09/11/2014, 7:57 AM Pager: [phone]

## 2014-09-12 ENCOUNTER — Encounter (HOSPITAL_COMMUNITY)
Admission: EM | Disposition: A | Discharge status: Home / Self Care | Payer: Self-pay | Source: Home / Self Care | Attending: Internal Medicine

## 2014-09-12 ENCOUNTER — Inpatient Hospital Stay (HOSPITAL_COMMUNITY): Payer: Medicare Other | Admitting: Anesthesiology

## 2014-09-12 DIAGNOSIS — K50919 Crohn's disease, unspecified, with unspecified complications: Secondary | ICD-10-CM

## 2014-09-12 HISTORY — PX: EXAMINATION UNDER ANESTHESIA: SHX1540

## 2014-09-12 HISTORY — PX: INCISION AND DRAINAGE PERIRECTAL ABSCESS: SHX1804

## 2014-09-12 LAB — GLUCOSE, CAPILLARY
GLUCOSE-CAPILLARY: 72 mg/dL (ref 70–99)
GLUCOSE-CAPILLARY: 71 mg/dL (ref 70–99)
Glucose-Capillary: 122 mg/dL — ABNORMAL HIGH (ref 70–99)
Glucose-Capillary: 133 mg/dL — ABNORMAL HIGH (ref 70–99)
Glucose-Capillary: 90 mg/dL (ref 70–99)
Glucose-Capillary: 95 mg/dL (ref 70–99)
Glucose-Capillary: 97 mg/dL (ref 70–99)

## 2014-09-12 LAB — BASIC METABOLIC PANEL
Anion gap: 11 (ref 5–15)
BUN: 24 mg/dL — ABNORMAL HIGH (ref 6–23)
CHLORIDE: 99 mmol/L (ref 96–112)
CO2: 20 mmol/L (ref 19–32)
Calcium: 7.5 mg/dL — ABNORMAL LOW (ref 8.4–10.5)
Creatinine, Ser: 2.67 mg/dL — ABNORMAL HIGH (ref 0.50–1.10)
GFR calc non Af Amer: 17 mL/min — ABNORMAL LOW (ref >90)
GFR, EST AFRICAN AMERICAN: 20 mL/min — AB (ref >90)
Glucose, Bld: 163 mg/dL — ABNORMAL HIGH (ref 70–99)
Potassium: 3.4 mmol/L — ABNORMAL LOW (ref 3.5–5.1)
SODIUM: 130 mmol/L — AB (ref 135–145)

## 2014-09-12 LAB — OCCULT BLOOD X 1 CARD TO LAB, STOOL: FECAL OCCULT BLD: POSITIVE — AB

## 2014-09-12 LAB — CLOSTRIDIUM DIFFICILE BY PCR: Toxigenic C. Difficile by PCR: NEGATIVE

## 2014-09-12 LAB — PROTIME-INR
INR: 1.3 (ref 0.00–1.49)
Prothrombin Time: 16.4 seconds — ABNORMAL HIGH (ref 11.6–15.2)

## 2014-09-12 SURGERY — EXAM UNDER ANESTHESIA
Anesthesia: General | Site: Rectum

## 2014-09-12 MED ORDER — OXYCODONE-ACETAMINOPHEN 5-325 MG PO TABS
1.0000 | ORAL_TABLET | ORAL | Status: DC | PRN
  Administered 2014-09-12 – 2014-09-14 (×4): 2 via ORAL
  Filled 2014-09-12 (×4): qty 2

## 2014-09-12 MED ORDER — ROCURONIUM BROMIDE 100 MG/10ML IV SOLN
INTRAVENOUS | Status: DC | PRN
  Administered 2014-09-12: 30 mg via INTRAVENOUS

## 2014-09-12 MED ORDER — MAGNESIUM SULFATE 2 GM/50ML IV SOLN
2.0000 g | Freq: Once | INTRAVENOUS | Status: AC
  Administered 2014-09-12: 2 g via INTRAVENOUS
  Filled 2014-09-12: qty 50

## 2014-09-12 MED ORDER — OXYCODONE HCL 5 MG PO TABS
ORAL_TABLET | ORAL | Status: AC
  Filled 2014-09-12: qty 1

## 2014-09-12 MED ORDER — FENTANYL CITRATE (PF) 250 MCG/5ML IJ SOLN
INTRAMUSCULAR | Status: AC
  Filled 2014-09-12: qty 5

## 2014-09-12 MED ORDER — FENTANYL CITRATE (PF) 100 MCG/2ML IJ SOLN
25.0000 ug | INTRAMUSCULAR | Status: DC | PRN
  Administered 2014-09-12 (×2): 50 ug via INTRAVENOUS

## 2014-09-12 MED ORDER — FENTANYL CITRATE (PF) 100 MCG/2ML IJ SOLN
INTRAMUSCULAR | Status: AC
  Administered 2014-09-12: 50 ug via INTRAVENOUS
  Filled 2014-09-12: qty 2

## 2014-09-12 MED ORDER — OXYCODONE HCL 5 MG/5ML PO SOLN: 5.0000 mg | Freq: Once | ORAL | Status: AC | PRN

## 2014-09-12 MED ORDER — LACTATED RINGERS IV SOLN
INTRAVENOUS | Status: DC | PRN
  Administered 2014-09-12: 10:00:00 via INTRAVENOUS

## 2014-09-12 MED ORDER — LIDOCAINE HCL (CARDIAC) 20 MG/ML IV SOLN
INTRAVENOUS | Status: DC | PRN
  Administered 2014-09-12: 50 mg via INTRAVENOUS

## 2014-09-12 MED ORDER — NEOSTIGMINE METHYLSULFATE 10 MG/10ML IV SOLN
INTRAVENOUS | Status: AC
  Filled 2014-09-12: qty 1

## 2014-09-12 MED ORDER — SODIUM CHLORIDE 0.9 % IV SOLN
INTRAVENOUS | Status: DC
  Administered 2014-09-12: 11:00:00 via INTRAVENOUS

## 2014-09-12 MED ORDER — NEOSTIGMINE METHYLSULFATE 10 MG/10ML IV SOLN
INTRAVENOUS | Status: DC | PRN
  Administered 2014-09-12: 3 mg via INTRAVENOUS

## 2014-09-12 MED ORDER — GLYCOPYRROLATE 0.2 MG/ML IJ SOLN
INTRAMUSCULAR | Status: AC
  Filled 2014-09-12: qty 3

## 2014-09-12 MED ORDER — MIDAZOLAM HCL 5 MG/5ML IJ SOLN
INTRAMUSCULAR | Status: DC | PRN
Start: 2014-09-12 — End: 2014-09-12
  Administered 2014-09-12: 2 mg via INTRAVENOUS

## 2014-09-12 MED ORDER — BUPIVACAINE-EPINEPHRINE (PF) 0.25% -1:200000 IJ SOLN
INTRAMUSCULAR | Status: AC
  Filled 2014-09-12: qty 30

## 2014-09-12 MED ORDER — MIDAZOLAM HCL 2 MG/2ML IJ SOLN
INTRAMUSCULAR | Status: AC
  Filled 2014-09-12: qty 2

## 2014-09-12 MED ORDER — ONDANSETRON HCL 4 MG/2ML IJ SOLN: 4.0000 mg | Freq: Four times a day (QID) | INTRAMUSCULAR | Status: DC | PRN

## 2014-09-12 MED ORDER — MORPHINE SULFATE 2 MG/ML IJ SOLN
2.0000 mg | INTRAMUSCULAR | Status: DC | PRN
  Administered 2014-09-12 – 2014-09-13 (×4): 2 mg via INTRAVENOUS
  Filled 2014-09-12 (×4): qty 1

## 2014-09-12 MED ORDER — OXYCODONE HCL 5 MG PO TABS
5.0000 mg | ORAL_TABLET | Freq: Once | ORAL | Status: AC | PRN
  Administered 2014-09-12: 5 mg via ORAL

## 2014-09-12 MED ORDER — PROPOFOL 10 MG/ML IV BOLUS
INTRAVENOUS | Status: AC
  Filled 2014-09-12: qty 20

## 2014-09-12 MED ORDER — ARTIFICIAL TEARS OP OINT
TOPICAL_OINTMENT | OPHTHALMIC | Status: DC | PRN
  Administered 2014-09-12: 1 via OPHTHALMIC

## 2014-09-12 MED ORDER — GLYCOPYRROLATE 0.2 MG/ML IJ SOLN
INTRAMUSCULAR | Status: DC | PRN
  Administered 2014-09-12: 0.6 mg via INTRAVENOUS

## 2014-09-12 MED ORDER — PROPOFOL 10 MG/ML IV BOLUS
INTRAVENOUS | Status: DC | PRN
  Administered 2014-09-12: 125 mg via INTRAVENOUS

## 2014-09-12 MED ORDER — LIDOCAINE HCL 2 % EX GEL
CUTANEOUS | Status: AC
  Filled 2014-09-12: qty 20

## 2014-09-12 MED ORDER — FENTANYL CITRATE (PF) 100 MCG/2ML IJ SOLN
INTRAMUSCULAR | Status: DC | PRN
  Administered 2014-09-12: 100 ug via INTRAVENOUS

## 2014-09-12 SURGICAL SUPPLY — 49 items
CANISTER SUCTION 2500CC (MISCELLANEOUS) ×3 IMPLANT
COVER SURGICAL LIGHT HANDLE (MISCELLANEOUS) ×3 IMPLANT
DECANTER SPIKE VIAL GLASS SM (MISCELLANEOUS) ×1 IMPLANT
DRAPE PED LAPAROTOMY (DRAPES) ×3 IMPLANT
DRSG PAD ABDOMINAL 8X10 ST (GAUZE/BANDAGES/DRESSINGS) ×3 IMPLANT
ELECT CAUTERY BLADE 6.4 (BLADE) ×2 IMPLANT
ELECT REM PT RETURN 9FT ADLT (ELECTROSURGICAL) ×3
ELECTRODE REM PT RTRN 9FT ADLT (ELECTROSURGICAL) ×1 IMPLANT
GAUZE PACKING IODOFORM 1 (PACKING) IMPLANT
GAUZE PACKING IODOFORM 1/4X15 (GAUZE/BANDAGES/DRESSINGS) ×2 IMPLANT
GAUZE SPONGE 4X4 12PLY STRL (GAUZE/BANDAGES/DRESSINGS) ×3 IMPLANT
GLOVE BIO SURGEON STRL SZ7 (GLOVE) ×3 IMPLANT
GLOVE BIOGEL PI IND STRL 6.5 (GLOVE) IMPLANT
GLOVE BIOGEL PI IND STRL 7.0 (GLOVE) IMPLANT
GLOVE BIOGEL PI IND STRL 7.5 (GLOVE) ×1 IMPLANT
GLOVE BIOGEL PI INDICATOR 6.5 (GLOVE) ×4
GLOVE BIOGEL PI INDICATOR 7.0 (GLOVE) ×2
GLOVE BIOGEL PI INDICATOR 7.5 (GLOVE) ×2
GLOVE SURG SS PI 7.0 STRL IVOR (GLOVE) ×2 IMPLANT
GOWN STRL REUS W/ TWL LRG LVL3 (GOWN DISPOSABLE) ×2 IMPLANT
GOWN STRL REUS W/TWL LRG LVL3 (GOWN DISPOSABLE) ×9
KIT BASIN OR (CUSTOM PROCEDURE TRAY) ×3 IMPLANT
KIT ROOM TURNOVER OR (KITS) ×3 IMPLANT
NDL HYPO 25GX1X1/2 BEV (NEEDLE) ×1 IMPLANT
NEEDLE HYPO 25GX1X1/2 BEV (NEEDLE) ×3 IMPLANT
NS IRRIG 1000ML POUR BTL (IV SOLUTION) ×3 IMPLANT
PACK GENERAL/GYN (CUSTOM PROCEDURE TRAY) ×3 IMPLANT
PACK LITHOTOMY IV (CUSTOM PROCEDURE TRAY) IMPLANT
PAD ARMBOARD 7.5X6 YLW CONV (MISCELLANEOUS) ×6 IMPLANT
PENCIL BUTTON HOLSTER BLD 10FT (ELECTRODE) ×3 IMPLANT
SPONGE LAP 18X18 X RAY DECT (DISPOSABLE) IMPLANT
SPONGE SURGIFOAM ABS GEL 100 (HEMOSTASIS) IMPLANT
SURGILUBE 2OZ TUBE FLIPTOP (MISCELLANEOUS) ×5 IMPLANT
SUT CHROMIC 2 0 SH (SUTURE) IMPLANT
SUT MON AB 3-0 SH 27 (SUTURE)
SUT MON AB 3-0 SH27 (SUTURE) IMPLANT
SWAB COLLECTION DEVICE MRSA (MISCELLANEOUS) ×3 IMPLANT
SYR CONTROL 10ML LL (SYRINGE) ×3 IMPLANT
TOWEL OR 17X24 6PK STRL BLUE (TOWEL DISPOSABLE) IMPLANT
TOWEL OR 17X26 10 PK STRL BLUE (TOWEL DISPOSABLE) ×3 IMPLANT
TRAY PROCTOSCOPIC FIBER OPTIC (SET/KITS/TRAYS/PACK) ×2 IMPLANT
TUBE ANAEROBIC SPECIMEN COL (MISCELLANEOUS) ×2 IMPLANT
TUBE CONNECTING 12'X1/4 (SUCTIONS) ×1
TUBE CONNECTING 12X1/4 (SUCTIONS) ×2 IMPLANT
TUBE CONNECTING 20'X1/4 (TUBING) ×1
TUBE CONNECTING 20X1/4 (TUBING) ×1 IMPLANT
UNDERPAD 30X30 INCONTINENT (UNDERPADS AND DIAPERS) ×3 IMPLANT
WATER STERILE IRR 1000ML POUR (IV SOLUTION) IMPLANT
YANKAUER SUCT BULB TIP NO VENT (SUCTIONS) ×3 IMPLANT

## 2014-09-12 NOTE — Op Note (Signed)
Preoperative diagnosis: perirectal abscess, crohns disease Postoperative diagnosis: same as above Procedure: incision and drainage of perirectal abscess Surgeon: Dr Serita Grammes Anesthesia: general EBL: minimal Drains: none Complications: none Sponge and needle count correct Specimen: cultures to microbiology Disposition to recovery stable  Indications: This is a 70 yof with history of Crohns who has perirectal abscess that now has a normal INR.  We discussed going to the OR for drainage.  Procedure: After informed consent was obtained the patient was taken to the operating room. She was on antibiotics already. SCDs were in place. She was placed under general anesthesia without complication. She was then rolled prone and appropriately padded.  She was prepped and draped in the standard sterile surgical fashion. Timeout was performed.   I did a rectal exam and she had purulence from her anus. I aspirated the right side and found more. I made an incision which led to the skin separating as it was so friable. I drained a very large perirectal abscess and did cultures. Irrigation was performed. I packed this. Dressings were placed. She tolerated this well and was transferred to recovery stable.

## 2014-09-12 NOTE — Anesthesia Postprocedure Evaluation (Signed)
Anesthesia Post Note  Patient: Natasha [name]  Procedure(s) Performed: Procedure(s) (LRB): EXAM UNDER ANESTHESIA (N/A) IRRIGATION AND DEBRIDEMENT PERIRECTAL ABSCESS (N/A)  Anesthesia type: General  Patient location: PACU  Post pain: Pain level controlled and Adequate analgesia  Post assessment: Post-op Vital signs reviewed, Patient's Cardiovascular Status Stable, Respiratory Function Stable, Patent Airway and Pain level controlled  Last Vitals:  Filed Vitals:   09/12/14 1254  BP: 106/62  Pulse: 70  Temp: 36.9 C  Resp: 20    Post vital signs: Reviewed and stable  Level of consciousness: awake, alert  and oriented  Complications: No apparent anesthesia complications

## 2014-09-12 NOTE — Anesthesia Preprocedure Evaluation (Addendum)
Anesthesia Evaluation  Patient identified by MRN, date of birth, ID band Patient awake    Reviewed: Allergy & Precautions, NPO status , Patient's Chart, lab work & pertinent test results  Airway Mallampati: II   Neck ROM: full    Dental  (+) Teeth Intact, Dental Advidsory Given   Pulmonary shortness of breath and with exertion, COPD COPD inhaler, former smoker,  breath sounds clear to auscultation        Cardiovascular hypertension, + Peripheral Vascular Disease and +CHF Rhythm:regular Rate:Normal     Neuro/Psych    GI/Hepatic GERD-  ,  Endo/Other  Hypothyroidism   Renal/GU Renal InsufficiencyRenal disease     Musculoskeletal  (+) Arthritis -,   Abdominal   Peds  Hematology  (+) anemia ,   Anesthesia Other Findings   Reproductive/Obstetrics                           Anesthesia Physical Anesthesia Plan  ASA: III  Anesthesia Plan: General   Post-op Pain Management:    Induction: Intravenous  Airway Management Planned: LMA  Additional Equipment:   Intra-op Plan:   Post-operative Plan:   Informed Consent: I have reviewed the patients History and Physical, chart, labs and discussed the procedure including the risks, benefits and alternatives for the proposed anesthesia with the patient or authorized representative who has indicated his/her understanding and acceptance.   Dental Advisory Given  Plan Discussed with: CRNA, Anesthesiologist and Surgeon  Anesthesia Plan Comments:        Anesthesia Quick Evaluation

## 2014-09-12 NOTE — Progress Notes (Signed)
[facility] PROGRESS NOTE  Natasha [name]:[id number] DOB: 10-Jul-1945 DOA: 09/09/2014 PCP: Ileana Roup, MD  History 69 year old AA female presents with multiple loose bowel movements vomiting and rectal pain. Found to have a perirectal abscess. On Coumadin for previous DVT. Surgery following and will take patient to the OR once INR down.  Assessment/Plan:   Ischiorectal abscess: continue zosyn.  Surgery 4/19    history of Left leg DVT:  5 months ago, sounds like it was provoked, from leg injury after falling.   Duplex shows chronic DVT,, spoke with vascular- can stop anticoagulation as treated for 5 months    Crohn's disease s/p Colectomy, Terminal ilectomies.  Managed by [facility]. On chronic pred  Anemia of CD    Metabolic acidosis, chronic, on chronic bicarbonate therapy    Hypokalemia:  Will correct -recheck -replace Mg    CKD (chronic kidney disease) stage 4, GFR 15-29 ml/min    Hypothyroidism: on synthroid   Code Status:  full Family Communication:  patient Disposition Plan:    Consultants:  surgery  Procedures:     Antibiotics:  Zosyn 4/16 --  HPI/Subjective: Up brushing teeth- still sore on bottom  Objective: Filed Vitals:   09/12/14 0932  BP: 100/54  Pulse: 67  Temp: 98.4 F (36.9 C)  Resp: 16    Intake/Output Summary (Last 24 hours) at 09/12/14 1045 Last data filed at 09/12/14 0900  Gross per 24 hour  Intake   2090 ml  Output    500 ml  Net   1590 ml   Filed Weights   09/09/14 0535  Weight: 49.442 kg (109 lb)    Exam:   General:  Awake, mildly uncomfortable  Cardiovascular: RRR without MGR  Respiratory: CTA without WRR  Abdomen: S, NT, ND  Skin: Left buttock indurated and tender  Ext: no CCE  Basic Metabolic Panel:  Recent Labs Lab 09/09/14 0600 09/10/14 0636 09/11/14 0725  NA 133* 138 135  K 3.6 3.1* 3.0*  CL 109 114* 104  CO2 12* 15* 20  GLUCOSE 100* 97 119*  BUN 49* 40* 31*   CREATININE 3.67* 2.94* 2.63*  CALCIUM 9.6 8.2* 8.3*  MG  --   --  1.5   Liver Function Tests:  Recent Labs Lab 09/09/14 0600 09/10/14 0636  AST 15 12  ALT 12 9  ALKPHOS 127* 93  BILITOT 0.4 0.4  PROT 6.9 5.2*  ALBUMIN 2.5* 1.8*   No results for input(s): LIPASE, AMYLASE in the last 168 hours. No results for input(s): AMMONIA in the last 168 hours. CBC:  Recent Labs Lab 09/09/14 0600 09/10/14 0636 09/11/14 0725 09/11/14 1825  WBC 11.5* 11.8* 13.8* 14.8*  NEUTROABS 9.0*  --   --   --   HGB 10.1* 8.1* 8.4* 8.5*  HCT 32.5* 25.4* 26.1* 26.4*  MCV 90.3 89.4 89.4 88.3  PLT 342 304 322 317   Cardiac Enzymes: No results for input(s): CKTOTAL, CKMB, CKMBINDEX, TROPONINI in the last 168 hours. BNP (last 3 results) No results for input(s): BNP in the last 8760 hours.  ProBNP (last 3 results) No results for input(s): PROBNP in the last 8760 hours.  CBG:  Recent Labs Lab 09/11/14 1652 09/11/14 2030 09/12/14 0006 09/12/14 0316 09/12/14 0812  GLUCAP 183* 136* 133* 122* 95    Recent Results (from the past 240 hour(s))  Clostridium Difficile by PCR     Status: None   Collection Time: 09/11/14 10:42 PM  Result Value Ref Range Status  C difficile by pcr NEGATIVE NEGATIVE Final     Studies: No results found.  Scheduled Meds: . [facility] Hold] antiseptic oral rinse  7 mL Mouth Rinse q12n4p  . [MAR Hold] calcitRIOL  0.5 mcg Oral Daily  . [MAR Hold] chlorhexidine  15 mL Mouth Rinse BID  . [MAR Hold] feeding supplement (PRO-STAT SUGAR FREE 64)  30 mL Oral TID  . [MAR Hold] folic acid  1 mg Oral Daily  . [MAR Hold] hydrocortisone sod succinate (SOLU-CORTEF) inj  50 mg Intravenous Q12H  . [MAR Hold] levothyroxine  125 mcg Oral QAC breakfast  . [MAR Hold] piperacillin-tazobactam (ZOSYN)  IV  2.25 g Intravenous Q8H  . [MAR Hold] sodium bicarbonate  650 mg Oral BID  . [MAR Hold] sodium chloride  3 mL Intravenous Q12H   Continuous Infusions: . sodium chloride 50 mL/hr at  09/12/14 1034  . dextrose 50 mL/hr at 09/11/14 2100  .  sodium bicarbonate 150 mEq in sterile water 1000 mL infusion 50 mL/hr at 09/12/14 0302    Time spent: 25 minutes  Natasha [name]  [facility]  [email or web address], password [facility] 09/12/2014, 10:45 AM  LOS: 3 days

## 2014-09-12 NOTE — Transfer of Care (Signed)
Immediate Anesthesia Transfer of Care Note  Patient: Natasha [name]  Procedure(s) Performed: Procedure(s): EXAM UNDER ANESTHESIA (N/A) IRRIGATION AND DEBRIDEMENT PERIRECTAL ABSCESS (N/A)  Patient Location: PACU  Anesthesia Type:General  Level of Consciousness: awake, alert  and oriented  Airway & Oxygen Therapy: Patient Spontanous Breathing and Patient connected to nasal cannula oxygen  Post-op Assessment: Report given to RN, Post -op Vital signs reviewed and stable and Patient moving all extremities X 4  Post vital signs: Reviewed and stable  Last Vitals:  Filed Vitals:   09/12/14 0932  BP: 100/54  Pulse: 67  Temp: 36.9 C  Resp: 16    Complications: No apparent anesthesia complications

## 2014-09-12 NOTE — Anesthesia Procedure Notes (Signed)
Procedure Name: Intubation Date/Time: 09/12/2014 10:52 AM Performed by: Neldon Newport Pre-anesthesia Checklist: Patient being monitored, Suction available, Emergency Drugs available, Patient identified and Timeout performed Patient Re-evaluated:Patient Re-evaluated prior to inductionOxygen Delivery Method: Circle system utilized Preoxygenation: Pre-oxygenation with 100% oxygen Intubation Type: IV induction Ventilation: Mask ventilation without difficulty Laryngoscope Size: Mac and 3 Grade View: Grade I Tube type: Oral Tube size: 7.0 mm Number of attempts: 1 Placement Confirmation: positive ETCO2,  ETT inserted through vocal cords under direct vision and breath sounds checked- equal and bilateral Secured at: 22 cm Tube secured with: Tape Dental Injury: Teeth and Oropharynx as per pre-operative assessment

## 2014-09-12 NOTE — Progress Notes (Signed)
Patient ID: Natasha [name], female   DOB: 1946-01-23, 69 y.o.   MRN: [id number]     [address] [place], [place] [address]    Phone: [phone] FAX: [phone]     Subjective: C/o pain.  NPO.  WBC up.  Afebrile.    Objective:  Vital signs:  Filed Vitals:   09/11/14 1612 09/11/14 2109 09/12/14 0307 09/12/14 0532  BP: 99/60 103/59 105/54 92/52  Pulse: 77 65 66   Temp: 97.6 F (36.4 C) 98.7 F (37.1 C) 98.2 F (36.8 C) 98.2 F (36.8 C)  TempSrc: Oral Oral Oral Oral  Resp: 14 18 18 16   Height:      Weight:      SpO2: 100% 100% 100% 98%    Last BM Date: 09/11/14  Intake/Output   Yesterday:  04/18 0701 - 04/19 0700 In: 2090 [P.O.:840; I.V.:1200; IV Piggyback:50] Out: 500 [Urine:500] This shift:    I/O last 3 completed shifts: In: 2090 [P.O.:840; I.V.:1200; IV Piggyback:50] Out: 1101 [Urine:1100; Stool:1]    Physical Exam: General: Pt awake/alert/oriented x4 in no acute distress Abdomen: Soft.  Nondistended.  No evidence of peritonitis.  No incarcerated hernias. GU-TTP and induration right buttock, no drainage   Problem List:   Principal Problem:   Ischiorectal abscess Active Problems:   Crohn's disease s/p Colectomy, Terminal ilectomies.  Managed by [facility]   Metabolic acidosis   Hypokalemia   CKD (chronic kidney disease) stage 4, GFR 15-29 ml/min   Hypothyroidism   Spinal stenosis of lumbar region   history of Left leg DVT   Hypotension   Hypoglycemia    Results:   Labs: Results for orders placed or performed during the hospital encounter of 09/09/14 (from the past 48 hour(s))  Glucose, capillary     Status: None   Collection Time: 09/10/14  8:24 AM  Result Value Ref Range   Glucose-Capillary 72 70 - 99 mg/dL  Glucose, capillary     Status: None   Collection Time: 09/10/14 12:34 PM  Result Value Ref Range   Glucose-Capillary 91 70 - 99 mg/dL  Glucose, capillary      Status: Abnormal   Collection Time: 09/10/14  5:46 PM  Result Value Ref Range   Glucose-Capillary 101 (H) 70 - 99 mg/dL  Glucose, capillary     Status: None   Collection Time: 09/10/14  9:54 PM  Result Value Ref Range   Glucose-Capillary 83 70 - 99 mg/dL  Occult blood card to lab, stool RN will collect     Status: Abnormal   Collection Time: 09/11/14 12:57 AM  Result Value Ref Range   Fecal Occult Bld POSITIVE (A) NEGATIVE  Glucose, capillary     Status: None   Collection Time: 09/11/14  1:04 AM  Result Value Ref Range   Glucose-Capillary 98 70 - 99 mg/dL  Glucose, capillary     Status: Abnormal   Collection Time: 09/11/14  4:04 AM  Result Value Ref Range   Glucose-Capillary 116 (H) 70 - 99 mg/dL  Basic metabolic panel     Status: Abnormal   Collection Time: 09/11/14  7:25 AM  Result Value Ref Range   Sodium 135 135 - 145 mmol/L   Potassium 3.0 (L) 3.5 - 5.1 mmol/L   Chloride 104 96 - 112 mmol/L   CO2 20 19 - 32 mmol/L   Glucose, Bld 119 (H) 70 - 99 mg/dL   BUN  31 (H) 6 - 23 mg/dL   Creatinine, Ser 2.63 (H) 0.50 - 1.10 mg/dL   Calcium 8.3 (L) 8.4 - 10.5 mg/dL   GFR calc non Af Amer 18 (L) >90 mL/min   GFR calc Af Amer 20 (L) >90 mL/min    Comment: (NOTE) The eGFR has been calculated using the CKD EPI equation. This calculation has not been validated in all clinical situations. eGFR's persistently <90 mL/min signify possible Chronic Kidney Disease.    Anion gap 11 5 - 15  CBC     Status: Abnormal   Collection Time: 09/11/14  7:25 AM  Result Value Ref Range   WBC 13.8 (H) 4.0 - 10.5 K/uL   RBC 2.92 (L) 3.87 - 5.11 MIL/uL   Hemoglobin 8.4 (L) 12.0 - 15.0 g/dL   HCT 26.1 (L) 36.0 - 46.0 %   MCV 89.4 78.0 - 100.0 fL   MCH 28.8 26.0 - 34.0 pg   MCHC 32.2 30.0 - 36.0 g/dL   RDW 14.6 11.5 - 15.5 %   Platelets 322 150 - 400 K/uL  Type and screen     Status: None   Collection Time: 09/11/14  7:25 AM  Result Value Ref Range   ABO/RH(D) O POS    Antibody Screen NEG     Sample Expiration 09/14/2014   Protime-INR     Status: Abnormal   Collection Time: 09/11/14  7:25 AM  Result Value Ref Range   Prothrombin Time 27.1 (H) 11.6 - 15.2 seconds   INR 2.49 (H) 0.00 - 1.49  ABO/Rh     Status: None   Collection Time: 09/11/14  7:25 AM  Result Value Ref Range   ABO/RH(D) O POS   Magnesium     Status: None   Collection Time: 09/11/14  7:25 AM  Result Value Ref Range   Magnesium 1.5 1.5 - 2.5 mg/dL  Glucose, capillary     Status: Abnormal   Collection Time: 09/11/14  7:54 AM  Result Value Ref Range   Glucose-Capillary 104 (H) 70 - 99 mg/dL  Glucose, capillary     Status: None   Collection Time: 09/11/14 11:48 AM  Result Value Ref Range   Glucose-Capillary 87 70 - 99 mg/dL  Glucose, capillary     Status: Abnormal   Collection Time: 09/11/14  4:52 PM  Result Value Ref Range   Glucose-Capillary 183 (H) 70 - 99 mg/dL  CBC     Status: Abnormal   Collection Time: 09/11/14  6:25 PM  Result Value Ref Range   WBC 14.8 (H) 4.0 - 10.5 K/uL   RBC 2.99 (L) 3.87 - 5.11 MIL/uL   Hemoglobin 8.5 (L) 12.0 - 15.0 g/dL   HCT 26.4 (L) 36.0 - 46.0 %   MCV 88.3 78.0 - 100.0 fL   MCH 28.4 26.0 - 34.0 pg   MCHC 32.2 30.0 - 36.0 g/dL   RDW 14.5 11.5 - 15.5 %   Platelets 317 150 - 400 K/uL  Glucose, capillary     Status: Abnormal   Collection Time: 09/11/14  8:30 PM  Result Value Ref Range   Glucose-Capillary 136 (H) 70 - 99 mg/dL  Glucose, capillary     Status: Abnormal   Collection Time: 09/12/14 12:06 AM  Result Value Ref Range   Glucose-Capillary 133 (H) 70 - 99 mg/dL  Glucose, capillary     Status: Abnormal   Collection Time: 09/12/14  3:16 AM  Result Value Ref Range   Glucose-Capillary 122 (H) 70 -  99 mg/dL    Imaging / Studies: No results found.  Medications / Allergies:  Scheduled Meds: . antiseptic oral rinse  7 mL Mouth Rinse q12n4p  . calcitRIOL  0.5 mcg Oral Daily  . chlorhexidine  15 mL Mouth Rinse BID  . feeding supplement (PRO-STAT SUGAR FREE  64)  30 mL Oral TID  . folic acid  1 mg Oral Daily  . hydrocortisone sod succinate (SOLU-CORTEF) inj  50 mg Intravenous Q12H  . levothyroxine  125 mcg Oral QAC breakfast  . piperacillin-tazobactam (ZOSYN)  IV  2.25 g Intravenous Q8H  . sodium bicarbonate  650 mg Oral BID  . sodium chloride  3 mL Intravenous Q12H   Continuous Infusions: . dextrose 50 mL/hr at 09/11/14 2100  .  sodium bicarbonate 150 mEq in sterile water 1000 mL infusion 50 mL/hr at 09/12/14 0302   PRN Meds:.acetaminophen, beclomethasone, HYDROcodone-acetaminophen, HYDROmorphone (DILAUDID) injection, tiotropium  Antibiotics: Anti-infectives    Start     Dose/Rate Route Frequency Ordered Stop   09/10/14 0800  piperacillin-tazobactam (ZOSYN) IVPB 2.25 g     2.25 g 100 mL/hr over 30 Minutes Intravenous Every 8 hours 09/10/14 0739     09/10/14 0745  piperacillin-tazobactam (ZOSYN) IVPB 3.375 g  Status:  Discontinued     3.375 g 12.5 mL/hr over 240 Minutes Intravenous 4 times per day 09/10/14 0735 09/10/14 0738   09/09/14 1015  piperacillin-tazobactam (ZOSYN) IVPB 3.375 g     3.375 g 12.5 mL/hr over 240 Minutes Intravenous  Once 09/09/14 1000 09/09/14 1148        Assessment/Plan Inflammatory process at right anus/rectum Right perirectal abscess -if INR <1.5.  Will proceed with EUA and I&D of perrectal abscess -pt has been NPO, on zosyn Crohns disease - possibly active near ileocolic anastomosis CKD  DVT on coumadin--reversed  Kairah Leoni, ANP-BC [facility] Pager [phone](7A-4:30P) For consults and floor pages call [phone](7A-4:30P)  09/12/2014 7:54 AM

## 2014-09-13 ENCOUNTER — Encounter (HOSPITAL_COMMUNITY): Payer: Self-pay | Admitting: General Surgery

## 2014-09-13 DIAGNOSIS — E440 Moderate protein-calorie malnutrition: Secondary | ICD-10-CM | POA: Insufficient documentation

## 2014-09-13 LAB — GLUCOSE, CAPILLARY
GLUCOSE-CAPILLARY: 113 mg/dL — AB (ref 70–99)
GLUCOSE-CAPILLARY: 192 mg/dL — AB (ref 70–99)
Glucose-Capillary: 115 mg/dL — ABNORMAL HIGH (ref 70–99)
Glucose-Capillary: 88 mg/dL (ref 70–99)
Glucose-Capillary: 121 mg/dL — ABNORMAL HIGH (ref 70–99)
Glucose-Capillary: 141 mg/dL — ABNORMAL HIGH (ref 70–99)

## 2014-09-13 LAB — CBC
HCT: 24.2 % — ABNORMAL LOW (ref 36.0–46.0)
Hemoglobin: 7.7 g/dL — ABNORMAL LOW (ref 12.0–15.0)
MCH: 28.6 pg (ref 26.0–34.0)
MCHC: 31.8 g/dL (ref 30.0–36.0)
MCV: 90 fL (ref 78.0–100.0)
PLATELETS: 288 10*3/uL (ref 150–400)
RBC: 2.69 MIL/uL — ABNORMAL LOW (ref 3.87–5.11)
RDW: 14.6 % (ref 11.5–15.5)
WBC: 13.6 10*3/uL — ABNORMAL HIGH (ref 4.0–10.5)

## 2014-09-13 LAB — PROTIME-INR
INR: 1.25 (ref 0.00–1.49)
PROTHROMBIN TIME: 15.9 s — AB (ref 11.6–15.2)

## 2014-09-13 MED ORDER — HEPARIN SODIUM (PORCINE) 5000 UNIT/ML IJ SOLN
5000.0000 [IU] | Freq: Three times a day (TID) | INTRAMUSCULAR | Status: DC
  Administered 2014-09-13 – 2014-09-14 (×3): 5000 [IU] via SUBCUTANEOUS
  Filled 2014-09-13 (×3): qty 1

## 2014-09-13 MED ORDER — POTASSIUM CHLORIDE CRYS ER 20 MEQ PO TBCR
40.0000 meq | EXTENDED_RELEASE_TABLET | Freq: Once | ORAL | Status: AC
  Administered 2014-09-13: 40 meq via ORAL
  Filled 2014-09-13: qty 2

## 2014-09-13 NOTE — Progress Notes (Signed)
Patient ID: Natasha [name], female   DOB: [birth year], 69 y.o.   MRN: [id number]     [place]      [address] [place], [place] [address]    Phone: [phone] FAX: [phone]     Subjective: Much less sore. No flatus.  Voiding.  Low grade temps.   Objective:  Vital signs:  Filed Vitals:   09/12/14 2355 09/13/14 0100 09/13/14 0105 09/13/14 0404  BP: 82/40 80/38 93/52  90/53  Pulse: 82   79  Temp: 99.3 F (37.4 C)   98.2 F (36.8 C)  TempSrc: Oral   Oral  Resp: 18   18  Height:      Weight:      SpO2: 91%   95%    Last BM Date: 09/12/14  Intake/Output   Yesterday:  04/19 0701 - 04/20 0700 In: 3351.7 [P.O.:480; I.V.:2721.7; IV Piggyback:150] Out: 1000 [Urine:1000] This shift:    I/O last 3 completed shifts: In: 4601.7 [P.O.:480; I.V.:3921.7; IV Piggyback:200] Out: 1200 [Urine:1200]    Physical Exam: General: Pt awake/alert/oriented x4 in no  acute distress Skin: buttock dressing-serosanguinous drainage noted on outer dressing.     Problem List:   Principal Problem:   Ischiorectal abscess Active Problems:   Crohn's disease s/p Colectomy, Terminal ilectomies.  Managed by [facility]   Metabolic acidosis   Hypokalemia   CKD (chronic kidney disease) stage 4, GFR 15-29 ml/min   Hypothyroidism   Spinal stenosis of lumbar region   history of Left leg DVT   Hypotension   Hypoglycemia    Results:   Labs: Results for orders placed or performed during the hospital encounter of 09/09/14 (from the past 48 hour(s))  Glucose, capillary     Status: None   Collection Time: 09/11/14 11:48 AM  Result Value Ref Range   Glucose-Capillary 87 70 - 99 mg/dL  Glucose, capillary     Status: Abnormal   Collection Time: 09/11/14  4:52 PM  Result Value Ref Range   Glucose-Capillary 183 (H) 70 - 99 mg/dL  CBC     Status: Abnormal   Collection Time: 09/11/14  6:25 PM  Result Value Ref Range   WBC 14.8 (H) 4.0 - 10.5  K/uL   RBC 2.99 (L) 3.87 - 5.11 MIL/uL   Hemoglobin 8.5 (L) 12.0 - 15.0 g/dL   HCT 26.4 (L) 36.0 - 46.0 %   MCV 88.3 78.0 - 100.0 fL   MCH 28.4 26.0 - 34.0 pg   MCHC 32.2 30.0 - 36.0 g/dL   RDW 14.5 11.5 - 15.5 %   Platelets 317 150 - 400 K/uL  Glucose, capillary     Status: Abnormal   Collection Time: 09/11/14  8:30 PM  Result Value Ref Range   Glucose-Capillary 136 (H) 70 - 99 mg/dL  Clostridium Difficile by PCR     Status: None   Collection Time: 09/11/14 10:42 PM  Result Value Ref Range   C difficile by pcr NEGATIVE NEGATIVE  Glucose, capillary     Status: Abnormal   Collection Time: 09/12/14 12:06 AM  Result Value Ref Range   Glucose-Capillary 133 (H) 70 - 99 mg/dL  Glucose, capillary     Status: Abnormal   Collection Time: 09/12/14  3:16 AM  Result Value Ref Range   Glucose-Capillary 122 (H) 70 - 99 mg/dL  Protime-INR     Status: Abnormal   Collection Time: 09/12/14  8:00 AM  Result Value Ref Range  Prothrombin Time 16.4 (H) 11.6 - 15.2 seconds   INR 1.30 0.00 - 1.49  Glucose, capillary     Status: None   Collection Time: 09/12/14  8:12 AM  Result Value Ref Range   Glucose-Capillary 95 70 - 99 mg/dL  Anaerobic culture     Status: None (Preliminary result)   Collection Time: 09/12/14 10:28 AM  Result Value Ref Range   Specimen Description ABSCESS    Special Requests PERIRECTAL PT ON ZOSYN    Gram Stain      ABUNDANT WBC PRESENT,BOTH PMN AND MONONUCLEAR NO SQUAMOUS EPITHELIAL CELLS SEEN ABUNDANT GRAM NEGATIVE RODS MODERATE GRAM POSITIVE RODS FEW GRAM POSITIVE COCCI    Culture PENDING    Report Status PENDING   Culture, routine-abscess     Status: None (Preliminary result)   Collection Time: 09/12/14 10:28 AM  Result Value Ref Range   Specimen Description ABSCESS    Special Requests PERIRECTAL PT ON ZOSYN    Gram Stain      ABUNDANT WBC PRESENT,BOTH PMN AND MONONUCLEAR NO SQUAMOUS EPITHELIAL CELLS SEEN ABUNDANT GRAM NEGATIVE RODS MODERATE GRAM POSITIVE  RODS FEW GRAM POSITIVE COCCI    Culture      NO GROWTH 1 DAY Performed at [facility]    Report Status PENDING   Glucose, capillary     Status: None   Collection Time: 09/12/14 12:48 PM  Result Value Ref Range   Glucose-Capillary 72 70 - 99 mg/dL  Glucose, capillary     Status: None   Collection Time: 09/12/14  4:26 PM  Result Value Ref Range   Glucose-Capillary 90 70 - 99 mg/dL  Basic metabolic panel     Status: Abnormal   Collection Time: 09/12/14  6:51 PM  Result Value Ref Range   Sodium 130 (L) 135 - 145 mmol/L   Potassium 3.4 (L) 3.5 - 5.1 mmol/L   Chloride 99 96 - 112 mmol/L   CO2 20 19 - 32 mmol/L   Glucose, Bld 163 (H) 70 - 99 mg/dL   BUN 24 (H) 6 - 23 mg/dL   Creatinine, Ser 2.67 (H) 0.50 - 1.10 mg/dL   Calcium 7.5 (L) 8.4 - 10.5 mg/dL   GFR calc non Af Amer 17 (L) >90 mL/min   GFR calc Af Amer 20 (L) >90 mL/min    Comment: (NOTE) The eGFR has been calculated using the CKD EPI equation. This calculation has not been validated in all clinical situations. eGFR's persistently <90 mL/min signify possible Chronic Kidney Disease.    Anion gap 11 5 - 15  Glucose, capillary     Status: None   Collection Time: 09/12/14  9:16 PM  Result Value Ref Range   Glucose-Capillary 71 70 - 99 mg/dL  Glucose, capillary     Status: None   Collection Time: 09/12/14 11:52 PM  Result Value Ref Range   Glucose-Capillary 97 70 - 99 mg/dL  Glucose, capillary     Status: Abnormal   Collection Time: 09/13/14  4:00 AM  Result Value Ref Range   Glucose-Capillary 115 (H) 70 - 99 mg/dL  Glucose, capillary     Status: None   Collection Time: 09/13/14  7:55 AM  Result Value Ref Range   Glucose-Capillary 88 70 - 99 mg/dL    Imaging / Studies: No results found.  Medications / Allergies:  Scheduled Meds: . antiseptic oral rinse  7 mL Mouth Rinse q12n4p  . calcitRIOL  0.5 mcg Oral Daily  . chlorhexidine  15  mL Mouth Rinse BID  . feeding supplement (PRO-STAT SUGAR FREE 64)  30  mL Oral TID  . folic acid  1 mg Oral Daily  . hydrocortisone sod succinate (SOLU-CORTEF) inj  50 mg Intravenous Q12H  . levothyroxine  125 mcg Oral QAC breakfast  . piperacillin-tazobactam (ZOSYN)  IV  2.25 g Intravenous Q8H  . sodium bicarbonate  650 mg Oral BID  . sodium chloride  3 mL Intravenous Q12H   Continuous Infusions: . sodium chloride 50 mL/hr at 09/12/14 1407  . dextrose 50 mL/hr at 09/11/14 2100  .  sodium bicarbonate 150 mEq in sterile water 1000 mL infusion 50 mL/hr at 09/13/14 0110   PRN Meds:.acetaminophen, beclomethasone, morphine injection, oxyCODONE-acetaminophen, tiotropium  Antibiotics: Anti-infectives    Start     Dose/Rate Route Frequency Ordered Stop   09/10/14 0800  piperacillin-tazobactam (ZOSYN) IVPB 2.25 g     2.25 g 100 mL/hr over 30 Minutes Intravenous Every 8 hours 09/10/14 0739     09/10/14 0745  piperacillin-tazobactam (ZOSYN) IVPB 3.375 g  Status:  Discontinued     3.375 g 12.5 mL/hr over 240 Minutes Intravenous 4 times per day 09/10/14 0735 09/10/14 0738   09/09/14 1015  piperacillin-tazobactam (ZOSYN) IVPB 3.375 g     3.375 g 12.5 mL/hr over 240 Minutes Intravenous  Once 09/09/14 1000 09/09/14 1148        Assessment/Plan POD#1 EUA and I&D of perirectal abscess -Leave packing in today, change outside if needed -mobilize -follow culture, negative to date -?is zosyn needed -plan to remove packing tomorrow and DC if stabl -SCD -may resume anticoagulation    Erby Pian, ANP-BC [facility] Pager [phone](7A-4:30P) For consults and floor pages call [phone](7A-4:30P)  09/13/2014  8:46 AM

## 2014-09-13 NOTE — Progress Notes (Signed)
[facility] PROGRESS NOTE  Natasha [name] JXB:[id number] DOB: July 12, 1945 DOA: 09/09/2014 PCP: Ileana Roup, MD  History 69 year old AA female presents with multiple loose bowel movements vomiting and rectal pain. Found to have a perirectal abscess. On Coumadin for previous DVT. Surgery following and will take patient to the OR once INR down.  Assessment/Plan:   Ischiorectal abscess: continue zosyn- defer to sugery Surgery 4/19    history of Left leg DVT:  5 months ago, sounds like it was provoked, from leg injury after falling.   Duplex shows chronic DVT,, spoke with vascular- can stop anticoagulation as treated for 5 months    Crohn's disease s/p Colectomy, Terminal ilectomies.  Managed by [facility]. 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    Metabolic acidosis, chronic, on chronic bicarbonate therapy    Hypokalemia:  Will correct -recheck -replace Mg    CKD (chronic kidney disease) stage 4, GFR 15-29 ml/min    Hypothyroidism: on synthroid   Code Status:  full Family Communication:  patient Disposition Plan:    Consultants:  surgery  Procedures:     Antibiotics:  Zosyn 4/16 --  HPI/Subjective: Pain is much improved after surgery  Objective: Filed Vitals:   09/13/14 0404  BP: 90/53  Pulse: 79  Temp: 98.2 F (36.8 C)  Resp: 18    Intake/Output Summary (Last 24 hours) at 09/13/14 1008 Last data filed at 09/13/14 0600  Gross per 24 hour  Intake 3351.67 ml  Output   1000 ml  Net 2351.67 ml   Filed Weights   09/09/14 0535  Weight: 49.442 kg (109 lb)    Exam:   General:  Awake, NAD  Cardiovascular: RRR without MGR  Respiratory: CTA without WRR  Abdomen: S, NT, ND  Skin: packing in place, mild bleeding  Ext: no CCE  Basic Metabolic Panel:  Recent Labs Lab 09/09/14 0600 09/10/14 0636 09/11/14 0725 09/12/14 1851  NA 133* 138 135 130*  K 3.6 3.1* 3.0* 3.4*  CL 109 114* 104 99   CO2 12* 15* 20 20  GLUCOSE 100* 97 119* 163*  BUN 49* 40* 31* 24*  CREATININE 3.67* 2.94* 2.63* 2.67*  CALCIUM 9.6 8.2* 8.3* 7.5*  MG  --   --  1.5  --    Liver Function Tests:  Recent Labs Lab 09/09/14 0600 09/10/14 0636  AST 15 12  ALT 12 9  ALKPHOS 127* 93  BILITOT 0.4 0.4  PROT 6.9 5.2*  ALBUMIN 2.5* 1.8*   No results for input(s): LIPASE, AMYLASE in the last 168 hours. No results for input(s): AMMONIA in the last 168 hours. CBC:  Recent Labs Lab 09/09/14 0600 09/10/14 0636 09/11/14 0725 09/11/14 1825 09/13/14 0711  WBC 11.5* 11.8* 13.8* 14.8* 13.6*  NEUTROABS 9.0*  --   --   --   --   HGB 10.1* 8.1* 8.4* 8.5* 7.7*  HCT 32.5* 25.4* 26.1* 26.4* 24.2*  MCV 90.3 89.4 89.4 88.3 90.0  PLT 342 304 322 317 288   Cardiac Enzymes: No results for input(s): CKTOTAL, CKMB, CKMBINDEX, TROPONINI in the last 168 hours. BNP (last 3 results) No results for input(s): BNP in the last 8760 hours.  ProBNP (last 3 results) No results for input(s): PROBNP in the last 8760 hours.  CBG:  Recent Labs Lab 09/12/14 1626 09/12/14 2116 09/12/14 2352 09/13/14 0400 09/13/14 0755  GLUCAP 90 71 97 115* 88    Recent Results (from  the past 240 hour(s))  Clostridium Difficile by PCR     Status: None   Collection Time: 09/11/14 10:42 PM  Result Value Ref Range Status   C difficile by pcr NEGATIVE NEGATIVE Final  Anaerobic culture     Status: None (Preliminary result)   Collection Time: 09/12/14 10:28 AM  Result Value Ref Range Status   Specimen Description ABSCESS  Final   Special Requests PERIRECTAL PT ON ZOSYN  Final   Gram Stain   Final    ABUNDANT WBC PRESENT,BOTH PMN AND MONONUCLEAR NO SQUAMOUS EPITHELIAL CELLS SEEN ABUNDANT GRAM NEGATIVE RODS MODERATE GRAM POSITIVE RODS FEW GRAM POSITIVE COCCI    Culture PENDING  Incomplete   Report Status PENDING  Incomplete  Culture, routine-abscess     Status: None (Preliminary result)   Collection Time: 09/12/14 10:28 AM   Result Value Ref Range Status   Specimen Description ABSCESS  Final   Special Requests PERIRECTAL PT ON ZOSYN  Final   Gram Stain   Final    ABUNDANT WBC PRESENT,BOTH PMN AND MONONUCLEAR NO SQUAMOUS EPITHELIAL CELLS SEEN ABUNDANT GRAM NEGATIVE RODS MODERATE GRAM POSITIVE RODS FEW GRAM POSITIVE COCCI    Culture   Final    NO GROWTH 1 DAY Performed at [facility]    Report Status PENDING  Incomplete     Studies: No results found.  Scheduled Meds: . antiseptic oral rinse  7 mL Mouth Rinse q12n4p  . calcitRIOL  0.5 mcg Oral Daily  . chlorhexidine  15 mL Mouth Rinse BID  . feeding supplement (PRO-STAT SUGAR FREE 64)  30 mL Oral TID  . folic acid  1 mg Oral Daily  . hydrocortisone sod succinate (SOLU-CORTEF) inj  50 mg Intravenous Q12H  . levothyroxine  125 mcg Oral QAC breakfast  . piperacillin-tazobactam (ZOSYN)  IV  2.25 g Intravenous Q8H  . sodium bicarbonate  650 mg Oral BID  . sodium chloride  3 mL Intravenous Q12H   Continuous Infusions: . sodium chloride 50 mL/hr at 09/12/14 1407  . dextrose 50 mL/hr at 09/11/14 2100  .  sodium bicarbonate 150 mEq in sterile water 1000 mL infusion 50 mL/hr at 09/13/14 0110    Time spent: 25 minutes  VANN, JESSICA  [facility]  [email or web address], password [facility] 09/13/2014, 10:08 AM  LOS: 4 days

## 2014-09-14 DIAGNOSIS — E162 Hypoglycemia, unspecified: Secondary | ICD-10-CM

## 2014-09-14 LAB — CBC
HEMATOCRIT: 25.1 % — AB (ref 36.0–46.0)
Hemoglobin: 7.9 g/dL — ABNORMAL LOW (ref 12.0–15.0)
MCH: 28.7 pg (ref 26.0–34.0)
MCHC: 31.5 g/dL (ref 30.0–36.0)
MCV: 91.3 fL (ref 78.0–100.0)
PLATELETS: 352 10*3/uL (ref 150–400)
RBC: 2.75 MIL/uL — AB (ref 3.87–5.11)
RDW: 15 % (ref 11.5–15.5)
WBC: 11.1 10*3/uL — AB (ref 4.0–10.5)

## 2014-09-14 LAB — BASIC METABOLIC PANEL
ANION GAP: 9 (ref 5–15)
BUN: 28 mg/dL — ABNORMAL HIGH (ref 6–23)
CO2: 26 mmol/L (ref 19–32)
Calcium: 8.2 mg/dL — ABNORMAL LOW (ref 8.4–10.5)
Chloride: 103 mmol/L (ref 96–112)
Creatinine, Ser: 2.81 mg/dL — ABNORMAL HIGH (ref 0.50–1.10)
GFR, EST AFRICAN AMERICAN: 19 mL/min — AB (ref >90)
GFR, EST NON AFRICAN AMERICAN: 16 mL/min — AB (ref >90)
Glucose, Bld: 91 mg/dL (ref 70–99)
Potassium: 4 mmol/L (ref 3.5–5.1)
SODIUM: 138 mmol/L (ref 135–145)

## 2014-09-14 LAB — CULTURE, ROUTINE-ABSCESS

## 2014-09-14 LAB — GLUCOSE, CAPILLARY
GLUCOSE-CAPILLARY: 137 mg/dL — AB (ref 70–99)
GLUCOSE-CAPILLARY: 96 mg/dL (ref 70–99)
Glucose-Capillary: 114 mg/dL — ABNORMAL HIGH (ref 70–99)

## 2014-09-14 MED ORDER — PREDNISONE 10 MG PO TABS: 10.0000 mg | ORAL_TABLET | Freq: Every day | ORAL | Status: DC

## 2014-09-14 MED ORDER — OXYCODONE-ACETAMINOPHEN 5-325 MG PO TABS
1.0000 | ORAL_TABLET | ORAL | Status: DC | PRN
Start: 2014-09-14 — End: 2014-11-28

## 2014-09-14 MED ORDER — MORPHINE SULFATE 2 MG/ML IJ SOLN: 2.0000 mg | Freq: Once | INTRAMUSCULAR | Status: DC

## 2014-09-14 MED ORDER — MORPHINE SULFATE 2 MG/ML IJ SOLN
2.0000 mg | Freq: Once | INTRAMUSCULAR | Status: AC
  Administered 2014-09-14: 2 mg via INTRAMUSCULAR
  Filled 2014-09-14: qty 1

## 2014-09-14 MED ORDER — PREDNISONE 20 MG PO TABS: 20.0000 mg | ORAL_TABLET | Freq: Every day | ORAL | Status: DC

## 2014-09-14 MED ORDER — PRO-STAT SUGAR FREE PO LIQD: 30.0000 mL | Freq: Three times a day (TID) | ORAL | Status: DC

## 2014-09-14 MED ORDER — PREDNISONE 20 MG PO TABS
20.0000 mg | ORAL_TABLET | Freq: Every day | ORAL | Status: DC
  Administered 2014-09-14: 20 mg via ORAL
  Filled 2014-09-14 (×2): qty 1

## 2014-09-14 MED ORDER — PREDNISONE 5 MG PO TABS: 5.0000 mg | ORAL_TABLET | Freq: Every day | ORAL | Status: DC

## 2014-09-14 MED ORDER — AMOXICILLIN-POT CLAVULANATE 875-125 MG PO TABS
1.0000 | ORAL_TABLET | Freq: Two times a day (BID) | ORAL | Status: DC
  Administered 2014-09-14: 1 via ORAL
  Filled 2014-09-14 (×2): qty 1

## 2014-09-14 MED ORDER — AMOXICILLIN-POT CLAVULANATE 875-125 MG PO TABS: 1.0000 | ORAL_TABLET | Freq: Two times a day (BID) | ORAL | Status: DC

## 2014-09-14 NOTE — Progress Notes (Signed)
[facility] Progress Note  2 Days Post-Op  Subjective: Pt doing well, says her pain is resolved.  Good appetite.  Ambulating well.     Objective: Vital signs in last 24 hours: Temp:  [97.8 F (36.6 C)-98.6 F (37 C)] 98.3 F (36.8 C) (04/21 0654) Pulse Rate:  [71-94] 71 (04/21 0654) Resp:  [18-19] 18 (04/21 0654) BP: (91-106)/(52-67) 97/52 mmHg (04/21 0654) SpO2:  [96 %-100 %] 96 % (04/21 0654) Last BM Date: 09/13/14  Intake/Output from previous day: 04/20 0701 - 04/21 0700 In: 362.5 [P.O.:150; I.V.:112.5; IV Piggyback:100] Out: 250 [Urine:250] Intake/Output this shift:    PE: Gen:  Alert, NAD, pleasant Rectum:  1cm incision with brown/red cloudy drainage, no induration/fluctuance   Lab Results:   Recent Labs  09/11/14 1825 09/13/14 0711  WBC 14.8* 13.6*  HGB 8.5* 7.7*  HCT 26.4* 24.2*  PLT 317 288   BMET  Recent Labs  09/12/14 1851  NA 130*  K 3.4*  CL 99  CO2 20  GLUCOSE 163*  BUN 24*  CREATININE 2.67*  CALCIUM 7.5*   PT/INR  Recent Labs  09/12/14 0800 09/13/14 0711  LABPROT 16.4* 15.9*  INR 1.30 1.25   CMP     Component Value Date/Time   NA 130* 09/12/2014 1851   K 3.4* 09/12/2014 1851   CL 99 09/12/2014 1851   CO2 20 09/12/2014 1851   GLUCOSE 163* 09/12/2014 1851   BUN 24* 09/12/2014 1851   CREATININE 2.67* 09/12/2014 1851   CALCIUM 7.5* 09/12/2014 1851   CALCIUM 5.9* 03/29/2014 2304   PROT 5.2* 09/10/2014 0636   PROT 6.6 09/28/2013 1059   ALBUMIN 1.8* 09/10/2014 0636   AST 12 09/10/2014 0636   ALT 9 09/10/2014 0636   ALKPHOS 93 09/10/2014 0636   BILITOT 0.4 09/10/2014 0636   GFRNONAA 17* 09/12/2014 1851   GFRAA 20* 09/12/2014 1851   Lipase     Component Value Date/Time   LIPASE 42 04/04/2014 0547       Studies/Results: No results found.  Anti-infectives: Anti-infectives    Start     Dose/Rate Route Frequency Ordered Stop   09/10/14 0800  piperacillin-tazobactam (ZOSYN) IVPB 2.25 g     2.25 g 100  mL/hr over 30 Minutes Intravenous Every 8 hours 09/10/14 0739     09/10/14 0745  piperacillin-tazobactam (ZOSYN) IVPB 3.375 g  Status:  Discontinued     3.375 g 12.5 mL/hr over 240 Minutes Intravenous 4 times per day 09/10/14 0735 09/10/14 0738   09/09/14 1015  piperacillin-tazobactam (ZOSYN) IVPB 3.375 g     3.375 g 12.5 mL/hr over 240 Minutes Intravenous  Once 09/09/14 1000 09/09/14 1148       Assessment/Plan POD #2 EUA and I&D of perirectal abscess -Remove packing, start sitz bath, no need to resume packing, change outer dressing as needed for saturation -Mobilize and IS -Follow culture, Gram neg rods, gram pos rods, gram pos cocci -Switch to Augmentin, will need 5 additional days at discharge -SCD's -Anticoagulation, heparin -D/c today if doing well    LOS: 5 days    DORT, Monifa Blanchette 09/14/2014, 9:08 AM Pager: [phone]

## 2014-09-14 NOTE — Progress Notes (Signed)
Packing removed from buttock by PA.  Pt. Having severe pain and IV pain medication was discontinue.  Paged Coralie Keens, PA with surgery and informed of above.  To place order for one time medication.  Will continue to monitor and await for new orders.  Alphonzo Lemmings, RN

## 2014-09-14 NOTE — Progress Notes (Signed)
Call in room by pt. & IV was bleeding.  Re-paged Coralie Keens, PA to informed her that pt. Has lost her IV site and if we could change IV pain med to IM.  RN given a verbal order and placed.  Will continue to monitor.   Alphonzo Lemmings, RN

## 2014-09-14 NOTE — Discharge Summary (Signed)
Physician Discharge Summary  JERLENE ROCKERS OHY:[id number] DOB: May 09, 1946 DOA: 09/09/2014  PCP: Ileana Roup, MD  Admit date: 09/09/2014 Discharge date: 09/14/2014  Time spent: 35 minutes  Recommendations for Outpatient Follow-up:  1. Sitz bath 2. abx 3. Steroid taper  Discharge Diagnoses:  Principal Problem:   Ischiorectal abscess Active Problems:   Crohn's disease s/p Colectomy, Terminal ilectomies.  Managed by [facility]   Metabolic acidosis   Hypokalemia   CKD (chronic kidney disease) stage 4, GFR 15-29 ml/min   Hypothyroidism   Spinal stenosis of lumbar region   history of Left leg DVT   Hypotension   Hypoglycemia   Malnutrition of moderate degree   Discharge Condition: improved  Diet recommendation: cardiac  Filed Weights   09/09/14 0535  Weight: 49.442 kg (109 lb)    History of present illness:  69 y/o ? crohn's since 1978 s/p colect/terminal ileal resections [colonoscopy 02/2010 reported wnl, EGD 12/2010 wnl--was scheduled actually for repeat colonoscopy November 2015,]c recurrent flares, COPD, HTN,a/CKD IV, [place], spinal stenosis foll by Neurology, Rh Arthritis? Admission 11/15 = [facility], acute LLE DVT, anion gap met acidosis 2/2 CK D, compensated systolic diastolic heart failure  Doing fairly until recently 1/52 ago on 4/3, started to have diarrhea, initial 4-5 episodes daily ? 10-14 episodes, saw PCP, given by mouth Levaquin-around this time also noticed excoriation, "lump" right side proximal Told by PCP that if symptoms got worse may need surgery  ? Size, tendernessSubsequent to Rectal exam by PCP = increasing pain, started to have nausea/vomiting-nonbloody, nonbilious  Took Anusol suppository 4/15 with minimal relief  Because of increasing pain/intractable vomiting decided to come to emergency room   Hospital Course:  Ischiorectal abscess: s/p zosyn- change to augmenin for 5 more days Surgery 4/19   history of Left leg DVT: 5 months ago,  sounds like it was provoked, from leg injury after falling.  Duplex shows chronic DVT,, spoke with vascular- can stop anticoagulation as treated for 5 months   Crohn's disease s/p Colectomy, Terminal ilectomies. Managed by [facility]. 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   Metabolic acidosis, chronic, on chronic bicarbonate therapy   Hypokalemia: Will correct -recheck -replace Mg   CKD (chronic kidney disease) stage 4, GFR 15-29 ml/min   Hypothyroidism: on synthroid   Procedures:  I&D  Consultations:  surgery  Discharge Exam: Filed Vitals:   09/14/14 1025  BP: 100/55  Pulse: 104  Temp: 98.2 F (36.8 C)  Resp: 18    General: A+Ox3, doing well Cardiovascular:rrr Respiratory: clear  Discharge Instructions   Discharge Instructions    Diet general    Complete by:  As directed      Discharge instructions    Complete by:  As directed   start sitz bath, no need to resume packing, change outer dressing as needed for saturation     Increase activity slowly    Complete by:  As directed           Current Discharge Medication List    START taking these medications   Details  Amino Acids-Protein Hydrolys (FEEDING SUPPLEMENT, PRO-STAT SUGAR FREE 64,) LIQD Take 30 mLs by mouth 3 (three) times daily. Qty: 900 mL, Refills: 0    amoxicillin-clavulanate (AUGMENTIN) 875-125 MG per tablet Take 1 tablet by mouth every 12 (twelve) hours. Qty: 10 tablet, Refills: 0    oxyCODONE-acetaminophen (PERCOCET/ROXICET) 5-325 MG per tablet Take 1-2 tablets by  mouth every 4 (four) hours as needed for moderate pain. Qty: 30 tablet, Refills: 0      CONTINUE these medications which have CHANGED   Details  !! predniSONE (DELTASONE) 10 MG tablet Take 1 tablet (10 mg total) by mouth daily with breakfast. Qty: 5 tablet, Refills: 00    !! predniSONE (DELTASONE) 20 MG tablet Take 1 tablet (20 mg total) by mouth daily with  breakfast. Qty: 4 tablet, Refills: 0    !! predniSONE (DELTASONE) 5 MG tablet Take 1 tablet (5 mg total) by mouth daily with breakfast. Qty: 30 tablet, Refills: 00     !! - Potential duplicate medications found. Please discuss with provider.    CONTINUE these medications which have NOT CHANGED   Details  acetaminophen (TYLENOL) 500 MG tablet Take 500 mg by mouth every 6 (six) hours as needed for mild pain.     albuterol (PROVENTIL HFA;VENTOLIN HFA) 108 (90 BASE) MCG/ACT inhaler Inhale 2 puffs into the lungs every 6 (six) hours as needed for wheezing or shortness of breath.    calcitRIOL (ROCALTROL) 0.5 MCG capsule Take 0.5 mcg by mouth daily.    cyanocobalamin (,VITAMIN B-12,) 1000 MCG/ML injection Inject 1,000 mcg into the muscle every 30 (thirty) days. Last injection was 04/28/12    cyclobenzaprine (FLEXERIL) 10 MG tablet Take 10 mg by mouth as needed for muscle spasms.     folic acid (FOLVITE) 1 MG tablet Take 1 mg by mouth daily.    levothyroxine (SYNTHROID, LEVOTHROID) 125 MCG tablet Take 125 mcg by mouth daily.    MAGNESIUM-OXIDE 400 (241.3 MG) MG tablet Take 400 mg by mouth daily.     promethazine (PHENERGAN) 25 MG tablet Take 25 mg by mouth every 6 (six) hours as needed for nausea or vomiting.     QVAR 80 MCG/ACT inhaler Inhale 2 puffs into the lungs as needed (for wheezing).     sodium bicarbonate 650 MG tablet Take 1 tablet (650 mg total) by mouth 2 (two) times daily. Qty: 60 tablet, Refills: 0    SPIRIVA HANDIHALER 18 MCG inhalation capsule Place 18 mcg into inhaler and inhale as needed (for COPD).     Cholecalciferol (VITAMIN D3) 2000 UNITS TABS       STOP taking these medications     traMADol (ULTRAM) 50 MG tablet      warfarin (COUMADIN) 5 MG tablet      enoxaparin (LOVENOX) 60 MG/0.6ML injection      MOVIPREP 100 G SOLR        Allergies  Allergen Reactions  . Mercaptopurine Other (See Comments)    Unknown reaction  . Remicade [Infliximab] Other  (See Comments)    Unknown reaction   Follow-up Information    Follow up with Shamleffer, Lucienne Capers, MD In 1 week.   Specialty:  Internal Medicine   Contact information:   [address] [place] [address] [phone]        The results of significant diagnostics from this hospitalization (including imaging, microbiology, ancillary and laboratory) are listed below for reference.    Significant Diagnostic Studies: Ct Abdomen Pelvis Wo Contrast  09/09/2014   CLINICAL DATA:  H/o crohn's disease, with worsening rectal pain and diarrhea the last few days. Hx: Hysterectomy, Appendectomy, Colon resection  EXAM: CT ABDOMEN AND PELVIS WITHOUT CONTRAST  TECHNIQUE: Multidetector CT imaging of the abdomen and pelvis was performed following the standard protocol without IV contrast.  COMPARISON:  03/29/2014  FINDINGS: There is  ill-defined masslike area of heterogeneous soft tissue attenuation that lies to the right of the anus in the peritoneal fat below the levator sling. It measures 5.5 cm x 3.9 cm x 5.3 cm. This is likely an inflammatory mass. It is higher in attenuation in more heterogeneous than expected from an abscess, and also less well-defined. There is questionable wall thickening in the adjacent anus and rectum.  There is an ileocolic anastomosis in the anterior mid to low central abdomen. The wall of the colon and adjacent small bowel at the anastomosis is thickened and irregular.  Remainder of the colon and small bowel show normal wall thickness with no adjacent mesenteric inflammation.  Lung bases are essentially clear. Heart is normal in size. Trace amount of pericardial fluid is noted.  Liver and spleen are unremarkable. Gallbladder surgically absent. Mild chronic dilation of common bile duct is stable. Normal pancreas. No adrenal masses.  Mild left renal cortical thinning. Bilateral nonobstructing intrarenal stones. Small low-density left midpole renal cortical lesion consistent  with a cyst, stable. No other renal masses. No hydronephrosis. Normal ureters. Bladder is unremarkable.  Uterus is surgically absent.  Atherosclerotic calcifications are noted along the abdominal aorta and iliac arteries. No aneurysm. No pathologically enlarged lymph nodes. No ascites.  Bones are demineralized. There are mild degenerative changes of the visualized spine. No osteoblastic or osteolytic lesions.  IMPRESSION: 1. Irregular wall thickening of the colon and adjacent small bowel at the anterior central to low abdomen ileocolic anastomosis. This suggest active Crohn's disease although there is no significant adjacent mesenteric inflammation. 2. Ill-defined focal heterogeneous soft tissue abnormality below the levator sling on the right adjacent to the right aspect of the anus in lower rectum. This has an inflammatory appearance. It is new since the prior CT. 3. No other evidence of an acute abnormality or of active Crohn's disease. 4. Numerous bilateral nonobstructing intrarenal stones. No ureteral stones or hydronephrosis.   Electronically Signed   By: Lajean Manes M.D.   On: 09/09/2014 09:54   Dg Chest Port 1 View  09/09/2014   CLINICAL DATA:  Ex-smoker.  COPD.  Hypertension.  EXAM: PORTABLE CHEST - 1 VIEW  COMPARISON:  03/29/2014.  FINDINGS: The cardiac silhouette remains borderline enlarged. Clear lungs. Mild diffuse peribronchial thickening with mild progression. Bilateral neck surgical clips are again demonstrated. Mild scoliosis.  IMPRESSION: Mildly progressive chronic bronchitic changes.   Electronically Signed   By: Claudie Revering M.D.   On: 09/09/2014 12:33    Microbiology: Recent Results (from the past 240 hour(s))  Clostridium Difficile by PCR     Status: None   Collection Time: 09/11/14 10:42 PM  Result Value Ref Range Status   C difficile by pcr NEGATIVE NEGATIVE Final  Anaerobic culture     Status: None (Preliminary result)   Collection Time: 09/12/14 10:28 AM  Result Value Ref  Range Status   Specimen Description ABSCESS  Final   Special Requests PERIRECTAL PT ON ZOSYN  Final   Gram Stain   Final    ABUNDANT WBC PRESENT,BOTH PMN AND MONONUCLEAR NO SQUAMOUS EPITHELIAL CELLS SEEN ABUNDANT GRAM NEGATIVE RODS MODERATE GRAM POSITIVE RODS FEW GRAM POSITIVE COCCI    Culture   Final    NO ANAEROBES ISOLATED; CULTURE IN PROGRESS FOR 5 DAYS Performed at [facility]    Report Status PENDING  Incomplete  Culture, routine-abscess     Status: None   Collection Time: 09/12/14 10:28 AM  Result Value Ref Range Status  Specimen Description ABSCESS  Final   Special Requests PERIRECTAL PT ON ZOSYN  Final   Gram Stain   Final    ABUNDANT WBC PRESENT,BOTH PMN AND MONONUCLEAR NO SQUAMOUS EPITHELIAL CELLS SEEN ABUNDANT GRAM NEGATIVE RODS MODERATE GRAM POSITIVE RODS FEW GRAM POSITIVE COCCI    Culture   Final    RARE YEAST CONSISTENT WITH CANDIDA SPECIES Performed at [facility]    Report Status 09/14/2014 FINAL  Final     Labs: Basic Metabolic Panel:  Recent Labs Lab 09/09/14 0600 09/10/14 0636 09/11/14 0725 09/12/14 1851 09/14/14 0838  NA 133* 138 135 130* 138  K 3.6 3.1* 3.0* 3.4* 4.0  CL 109 114* 104 99 103  CO2 12* 15* 20 20 26   GLUCOSE 100* 97 119* 163* 91  BUN 49* 40* 31* 24* 28*  CREATININE 3.67* 2.94* 2.63* 2.67* 2.81*  CALCIUM 9.6 8.2* 8.3* 7.5* 8.2*  MG  --   --  1.5  --   --    Liver Function Tests:  Recent Labs Lab 09/09/14 0600 09/10/14 0636  AST 15 12  ALT 12 9  ALKPHOS 127* 93  BILITOT 0.4 0.4  PROT 6.9 5.2*  ALBUMIN 2.5* 1.8*   No results for input(s): LIPASE, AMYLASE in the last 168 hours. No results for input(s): AMMONIA in the last 168 hours. CBC:  Recent Labs Lab 09/09/14 0600 09/10/14 0636 09/11/14 0725 09/11/14 1825 09/13/14 0711 09/14/14 0838  WBC 11.5* 11.8* 13.8* 14.8* 13.6* 11.1*  NEUTROABS 9.0*  --   --   --   --   --   HGB 10.1* 8.1* 8.4* 8.5* 7.7* 7.9*  HCT 32.5* 25.4* 26.1* 26.4* 24.2*  25.1*  MCV 90.3 89.4 89.4 88.3 90.0 91.3  PLT 342 304 322 317 288 352   Cardiac Enzymes: No results for input(s): CKTOTAL, CKMB, CKMBINDEX, TROPONINI in the last 168 hours. BNP: BNP (last 3 results) No results for input(s): BNP in the last 8760 hours.  ProBNP (last 3 results) No results for input(s): PROBNP in the last 8760 hours.  CBG:  Recent Labs Lab 09/13/14 2026 09/13/14 2346 09/14/14 0437 09/14/14 0809 09/14/14 1146  GLUCAP 192* 113* 137* 96 114*       Signed:  Phuong Hillary  [facility] 09/14/2014, 1:40 PM

## 2014-09-14 NOTE — Progress Notes (Signed)
Pt discharging via wheelchair, escorted by staff, meeting husband downstairs Discharge instructions provided to patient, verbalizes understanding and able to provide appropriate teach back. PIV discontinued, site without s/s of complication. Denies needs or questions at this time.

## 2014-09-17 LAB — ANAEROBIC CULTURE

## 2014-09-21 ENCOUNTER — Other Ambulatory Visit: Payer: Self-pay | Admitting: Gastroenterology

## 2014-09-22 ENCOUNTER — Encounter (HOSPITAL_COMMUNITY): Payer: Self-pay | Admitting: *Deleted

## 2014-09-24 ENCOUNTER — Encounter (HOSPITAL_COMMUNITY): Payer: Self-pay | Admitting: Anesthesiology

## 2014-09-24 NOTE — Anesthesia Preprocedure Evaluation (Signed)
Anesthesia Evaluation  Patient identified by MRN, date of birth, ID band Patient awake    Reviewed: Allergy & Precautions, NPO status , Patient's Chart, lab work & pertinent test results  Airway Mallampati: II  TM Distance: >3 FB Neck ROM: Full    Dental no notable dental hx.    Pulmonary shortness of breath and with exertion, COPD COPD inhaler, former smoker,  breath sounds clear to auscultation  Pulmonary exam normal       Cardiovascular hypertension, + Peripheral Vascular Disease and +CHF Rhythm:Regular Rate:Normal     Neuro/Psych negative neurological ROS  negative psych ROS   GI/Hepatic negative GI ROS, Neg liver ROS, GERD-  ,  Endo/Other  Hypothyroidism   Renal/GU Renal InsufficiencyRenal disease  negative genitourinary   Musculoskeletal  (+) Arthritis -,   Abdominal   Peds negative pediatric ROS (+)  Hematology  (+) anemia ,   Anesthesia Other Findings   Reproductive/Obstetrics negative OB ROS                             Anesthesia Physical Anesthesia Plan  ASA: III  Anesthesia Plan: General and MAC   Post-op Pain Management:    Induction: Intravenous  Airway Management Planned:   Additional Equipment:   Intra-op Plan:   Post-operative Plan:   Informed Consent: I have reviewed the patients History and Physical, chart, labs and discussed the procedure including the risks, benefits and alternatives for the proposed anesthesia with the patient or authorized representative who has indicated his/her understanding and acceptance.   Dental advisory given  Plan Discussed with: CRNA  Anesthesia Plan Comments:         Anesthesia Quick Evaluation

## 2014-09-25 ENCOUNTER — Ambulatory Visit (HOSPITAL_COMMUNITY): Payer: Medicare Other | Admitting: Anesthesiology

## 2014-09-25 ENCOUNTER — Ambulatory Visit (HOSPITAL_COMMUNITY)
Admission: RE | Admit: 2014-09-25 | Discharge: 2014-09-25 | Disposition: A | Discharge status: Home / Self Care | Payer: Medicare Other | Source: Ambulatory Visit | Attending: Gastroenterology | Admitting: Gastroenterology

## 2014-09-25 ENCOUNTER — Encounter (HOSPITAL_COMMUNITY)
Admission: RE | Disposition: A | Discharge status: Home / Self Care | Payer: Self-pay | Source: Ambulatory Visit | Attending: Gastroenterology

## 2014-09-25 ENCOUNTER — Encounter (HOSPITAL_COMMUNITY): Payer: Self-pay | Admitting: Gastroenterology

## 2014-09-25 DIAGNOSIS — D649 Anemia, unspecified: Secondary | ICD-10-CM | POA: Insufficient documentation

## 2014-09-25 DIAGNOSIS — M1990 Unspecified osteoarthritis, unspecified site: Secondary | ICD-10-CM | POA: Insufficient documentation

## 2014-09-25 DIAGNOSIS — I129 Hypertensive chronic kidney disease with stage 1 through stage 4 chronic kidney disease, or unspecified chronic kidney disease: Secondary | ICD-10-CM | POA: Diagnosis not present

## 2014-09-25 DIAGNOSIS — H409 Unspecified glaucoma: Secondary | ICD-10-CM | POA: Diagnosis not present

## 2014-09-25 DIAGNOSIS — J449 Chronic obstructive pulmonary disease, unspecified: Secondary | ICD-10-CM | POA: Insufficient documentation

## 2014-09-25 DIAGNOSIS — E890 Postprocedural hypothyroidism: Secondary | ICD-10-CM | POA: Insufficient documentation

## 2014-09-25 DIAGNOSIS — M810 Age-related osteoporosis without current pathological fracture: Secondary | ICD-10-CM | POA: Insufficient documentation

## 2014-09-25 DIAGNOSIS — D124 Benign neoplasm of descending colon: Secondary | ICD-10-CM | POA: Diagnosis not present

## 2014-09-25 DIAGNOSIS — K5000 Crohn's disease of small intestine without complications: Secondary | ICD-10-CM | POA: Diagnosis present

## 2014-09-25 DIAGNOSIS — Z9049 Acquired absence of other specified parts of digestive tract: Secondary | ICD-10-CM | POA: Diagnosis not present

## 2014-09-25 DIAGNOSIS — K50014 Crohn's disease of small intestine with abscess: Secondary | ICD-10-CM | POA: Diagnosis not present

## 2014-09-25 DIAGNOSIS — N184 Chronic kidney disease, stage 4 (severe): Secondary | ICD-10-CM | POA: Insufficient documentation

## 2014-09-25 DIAGNOSIS — K219 Gastro-esophageal reflux disease without esophagitis: Secondary | ICD-10-CM | POA: Diagnosis not present

## 2014-09-25 DIAGNOSIS — E538 Deficiency of other specified B group vitamins: Secondary | ICD-10-CM | POA: Insufficient documentation

## 2014-09-25 DIAGNOSIS — Z800 Family history of malignant neoplasm of digestive organs: Secondary | ICD-10-CM | POA: Insufficient documentation

## 2014-09-25 DIAGNOSIS — Z87891 Personal history of nicotine dependence: Secondary | ICD-10-CM | POA: Diagnosis not present

## 2014-09-25 DIAGNOSIS — I739 Peripheral vascular disease, unspecified: Secondary | ICD-10-CM | POA: Diagnosis not present

## 2014-09-25 DIAGNOSIS — Z9089 Acquired absence of other organs: Secondary | ICD-10-CM | POA: Diagnosis not present

## 2014-09-25 DIAGNOSIS — Z86718 Personal history of other venous thrombosis and embolism: Secondary | ICD-10-CM | POA: Insufficient documentation

## 2014-09-25 DIAGNOSIS — I509 Heart failure, unspecified: Secondary | ICD-10-CM | POA: Insufficient documentation

## 2014-09-25 HISTORY — PX: COLONOSCOPY WITH PROPOFOL: SHX5780

## 2014-09-25 SURGERY — COLONOSCOPY WITH PROPOFOL
Anesthesia: Monitor Anesthesia Care

## 2014-09-25 MED ORDER — PROPOFOL 10 MG/ML IV BOLUS
INTRAVENOUS | Status: AC
  Filled 2014-09-25: qty 20

## 2014-09-25 MED ORDER — PROPOFOL 10 MG/ML IV BOLUS
INTRAVENOUS | Status: DC | PRN
  Administered 2014-09-25: 80 mg via INTRAVENOUS
  Administered 2014-09-25: 20 mg via INTRAVENOUS

## 2014-09-25 MED ORDER — PROPOFOL INFUSION 10 MG/ML OPTIME
INTRAVENOUS | Status: DC | PRN
  Administered 2014-09-25: 180 ug/kg/min via INTRAVENOUS

## 2014-09-25 MED ORDER — SODIUM CHLORIDE 0.9 % IV SOLN: INTRAVENOUS | Status: DC

## 2014-09-25 MED ORDER — LACTATED RINGERS IV SOLN
INTRAVENOUS | Status: DC
  Administered 2014-09-25: 08:00:00 via INTRAVENOUS

## 2014-09-25 MED ORDER — LIDOCAINE HCL (CARDIAC) 20 MG/ML IV SOLN
INTRAVENOUS | Status: DC | PRN
  Administered 2014-09-25: 50 mg via INTRAVENOUS

## 2014-09-25 MED ORDER — LIDOCAINE HCL (CARDIAC) 20 MG/ML IV SOLN
INTRAVENOUS | Status: AC
  Filled 2014-09-25: qty 5

## 2014-09-25 SURGICAL SUPPLY — 21 items

## 2014-09-25 NOTE — Discharge Instructions (Signed)
Colonoscopy, Care After Refer to this sheet in the next few weeks. These instructions provide you with information on caring for yourself after your procedure. Your health care provider may also give you more specific instructions. Your treatment has been planned according to current medical practices, but problems sometimes occur. Call your health care provider if you have any problems or questions after your procedure. WHAT TO EXPECT AFTER THE PROCEDURE  After your procedure, it is typical to have the following:  A small amount of blood in your stool.  Moderate amounts of gas and mild abdominal cramping or bloating. HOME CARE INSTRUCTIONS  Do not drive, operate machinery, or sign important documents for 24 hours.  You may shower and resume your regular physical activities, but move at a slower pace for the first 24 hours.  Take frequent rest periods for the first 24 hours.  Walk around or put a warm pack on your abdomen to help reduce abdominal cramping and bloating.  Drink enough fluids to keep your urine clear or pale yellow.  You may resume your normal diet as instructed by your health care provider. Avoid heavy or fried foods that are hard to digest.  Avoid drinking alcohol for 24 hours or as instructed by your health care provider.  Only take over-the-counter or prescription medicines as directed by your health care provider.  If a tissue sample (biopsy) was taken during your procedure:  Do not take aspirin or blood thinners for 7 days, or as instructed by your health care provider.  Do not drink alcohol for 7 days, or as instructed by your health care provider.  Eat soft foods for the first 24 hours. SEEK MEDICAL CARE IF: You have persistent spotting of blood in your stool 2-3 days after the procedure. SEEK IMMEDIATE MEDICAL CARE IF:  You have more than a small spotting of blood in your stool.  You pass large blood clots in your stool.  Your abdomen is swollen  (distended).  You have nausea or vomiting.  You have a fever.  You have increasing abdominal pain that is not relieved with medicine. Document Released: 12/25/2003 Document Revised: 03/02/2013 Document Reviewed: 01/17/2013 [facility] Patient Information 2015 [place], [place]. This information is not intended to replace advice given to you by your health care provider. Make sure you discuss any questions you have with your health care provider.

## 2014-09-25 NOTE — H&P (Signed)
  Problem: Chronic Crohn's ileitis. Brother diagnosed with colon cancer at age 69. 09/12/2014 ischiorectal abscess surgically drained. Anemia.  History: The patient is a 69 year old female born June 18, 1945. She has chronic Crohn's ileitis complicated by vitamin B 12 deficiency. She was hospitalized from 09/09/2014 to April 21/2016 to treat an ischiorectal abscess which was surgically drained on 09/12/2014. She was discharged from the hospital on Augmentin antibiotic therapy. Preoperative CT scan of the abdomen and pelvis demonstrated the abscess and suggested inflammation at her ileo--colonic surgical anastomosis. Discharge serum creatinine was 2.8. Discharge white blood cell count was 11,100. Discharge hemoglobin was 7.9 g.  In October 2011 her colonoscopy was normal. She did have mild ileitis at the ileo--colonic surgical anastomosis.  The patient is scheduled to undergo diagnostic colonoscopy today.  Past medical history: Chronic Crohn's ileitis. Hypothyroidism. Hypertension. 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 Vitamin B 12 deficiency. Pancreas to be some. Osteoporosis. Kidney stones. Glaucoma. Left leg deep venous thrombosis diagnosed in November 2015. Chronic obstructive pulmonary disease. Three terminal ileum resections. Right colon resection. Appendectomy. TAH-BSO. Cholecystectomy. Thyroidectomy.  Medication allergies: Remicade caused joint pains. Mercaptopurine caused pancreatitis.  Family history: Brother diagnosed with colon cancer at age 45  Exam: The patient is alert and lying comfortably on the endoscopy stretcher. Abdomen is soft and nontender to palpation. Lungs are clear to auscultation. Cardiac exam reveals a regular  Plan: Proceed with diagnostic colonoscopy

## 2014-09-25 NOTE — Transfer of Care (Signed)
Immediate Anesthesia Transfer of Care Note  Patient: Natasha [name]  Procedure(s) Performed: Procedure(s): COLONOSCOPY WITH PROPOFOL (N/A)  Patient Location: PACU  Anesthesia Type:MAC  Level of Consciousness: awake, alert  and oriented  Airway & Oxygen Therapy: Patient Spontanous Breathing and Patient connected to face mask oxygen  Post-op Assessment: Report given to RN and Post -op Vital signs reviewed and stable  Post vital signs: Reviewed and stable  Last Vitals:  Filed Vitals:   09/25/14 0745  BP: 133/77  Pulse: 75  Temp: 36.8 C  Resp: 14    Complications: No apparent anesthesia complications

## 2014-09-25 NOTE — Op Note (Signed)
Problem: Chronic Crohn's ileitis complicated by vitamin B 12 deficiency. Brother diagnosed with colon cancer at age 69. 09/12/2014 surgical drainage of an ischiorectal abscess. Anemia  Endoscopist: Earle Gell  Premedication: Propofol administered by anesthesia  Procedure: Diagnostic colonoscopy The patient was placed in the left lateral decubitus position. Anal inspection and digital rectal exam were normal. The Pentax pediatric colonoscope was introduced into the rectum and advanced to the ileo--right colonic surgical anastomosis. Colonic preparation for the exam today was good. Withdrawal time was 18 minutes  Rectum. Normal. Retroflexed view of the distal rectum normal  Sigmoid colon. Normal  Descending colon. From the mid descending colon, a 7 mm sessile polyp was removed with the electrocautery snare.  Splenic flexure. Normal  Transverse colon. Normal  Hepatic flexure. Normal  Ascending colon. Normal  Ileo--right colonic surgical anastomosis. There were 3 small aphthous ulcers on the terminal ileum at the anastomosis. I was unable to intubate the neoterminal ileum with the colonoscope.  Assessment:  #1. A 7 mm sessile descending colon polyp was removed with the electrocautery snare  #2. Mild, active Crohn's ileitis at the surgical anastomosis.  Recommendation: The patient should undergo a surveillance colonoscopy in 5 years.

## 2014-09-25 NOTE — Anesthesia Postprocedure Evaluation (Signed)
  Anesthesia Post-op Note  Patient: [name]  Procedure(s) Performed: Procedure(s) (LRB): COLONOSCOPY WITH PROPOFOL (N/A)  Patient Location: PACU  Anesthesia Type: MAC  Level of Consciousness: awake and alert   Airway and Oxygen Therapy: Patient Spontanous Breathing  Post-op Pain: mild  Post-op Assessment: Post-op Vital signs reviewed, Patient's Cardiovascular Status Stable, Respiratory Function Stable, Patent Airway and No signs of Nausea or vomiting  Last Vitals:  Filed Vitals:   09/25/14 0920  BP:   Pulse:   Temp:   Resp: 17    Post-op Vital Signs: stable   Complications: No apparent anesthesia complications

## 2014-09-26 ENCOUNTER — Encounter (HOSPITAL_COMMUNITY): Payer: Self-pay | Admitting: Gastroenterology

## 2014-09-29 NOTE — Progress Notes (Signed)
Will Call File follow-up: phone call placed to pt.  Left voice message re: calling [facility] office @ [phone] to discuss rescheduling an office appt. with Dr. Bridgett Larsson, to get surgery scheduled for Right Arm AVF Creation.

## 2014-10-16 ENCOUNTER — Ambulatory Visit
Admission: RE | Admit: 2014-10-16 | Discharge: 2014-10-16 | Disposition: A | Discharge status: Home / Self Care | Payer: Medicare Other | Source: Ambulatory Visit | Attending: Internal Medicine | Admitting: Internal Medicine

## 2014-10-16 ENCOUNTER — Other Ambulatory Visit: Payer: Self-pay | Admitting: Internal Medicine

## 2014-10-16 DIAGNOSIS — IMO0001 Reserved for inherently not codable concepts without codable children: Secondary | ICD-10-CM

## 2014-10-19 ENCOUNTER — Other Ambulatory Visit (HOSPITAL_COMMUNITY): Payer: Self-pay | Admitting: *Deleted

## 2014-10-20 ENCOUNTER — Encounter (HOSPITAL_COMMUNITY)
Admission: RE | Admit: 2014-10-20 | Discharge: 2014-10-20 | Disposition: A | Discharge status: Home / Self Care | Payer: Medicare Other | Source: Ambulatory Visit | Attending: Nephrology | Admitting: Nephrology

## 2014-10-20 DIAGNOSIS — D509 Iron deficiency anemia, unspecified: Secondary | ICD-10-CM | POA: Diagnosis present

## 2014-10-20 MED ORDER — FERUMOXYTOL INJECTION 510 MG/17 ML
510.0000 mg | INTRAVENOUS | Status: DC
  Administered 2014-10-20: 510 mg via INTRAVENOUS
  Filled 2014-10-20: qty 17

## 2014-10-25 ENCOUNTER — Other Ambulatory Visit: Payer: Self-pay | Admitting: General Surgery

## 2014-10-25 ENCOUNTER — Other Ambulatory Visit (HOSPITAL_COMMUNITY): Payer: Self-pay | Admitting: *Deleted

## 2014-10-25 NOTE — H&P (Signed)
Natasha [name] 10/25/2014 4:34 PM Location: [facility] Patient #: [id number] DOB: 1946-03-26 Married / Language: English / Race: Black or African American Female History of Present Illness [name] MD; 08/25/3242 5:05 PM) Patient words: recheck abcess.  The patient is a 69 year old female who presents with anal fistula. 69 year old female who presents to the office with persistent perirectal drainage after incision and drainage of a perirectal abscess performed approximately 6 weeks ago. Patient with a history of Crohn's disease currently on prednisone taper for active Crohn's disease seen on colonoscopy while in the hospital. She reports 3-4 bowel movements a day. She denies any blood in her stools. She complains of persistent pain in her perirectal area along with the purulent drainage. She denies any fecal leakage. She has never had any major anorectal procedures before. Problem List/Past Medical [name], MD; 0/05/270 5:06 PM) POSTOPERATIVE STATE (V45.89  Z98.89) PERIANAL CROHN'S DISEASE, WITH FISTULA (555.1  K50.113)  Other Problems [name], MD; 09/26/6642 5:06 PM) Ulcerative Colitis Thyroid Disease Thyroid Cancer Crohn's Disease Colon Cancer Pulmonary Embolism / Blood Clot in Legs Gastroesophageal Reflux Disease Chronic Obstructive Lung Disease Back Pain Arthritis  Past Surgical History [name], MD; 0/07/4740 5:04 PM) Foot Surgery Left. Colon Removal - Partial Resection of Small Bowel Hysterectomy (not due to cancer) - Complete Cataract Surgery Left. Thyroid Surgery  Diagnostic Studies History [name], MD; 10/01/5636 5:04 PM) Mammogram 1-3 years ago Colonoscopy within last year Pap Smear 1-5 years ago  Allergies Mammie Lorenzo, LPN; 11/28/6431 2:95 PM) No Known Drug Allergies 10/17/2014  Medication History [name], MD; 06/02/8414 5:04 PM) Fluticasone Propionate (Nasal) (50MCG/ACT Suspension,  Nasal) Active. PriLOSEC (40MG Capsule DR, Oral) Active. Flexeril (10MG Tablet, Oral) Active. Potassium Chloride ER (20MEQ Tablet ER, Oral) Active. Phenergan (25MG Tablet, Oral) Active. Calcitriol (0.25MCG Capsule, Oral) Active. Folic Acid (1MG Tablet, Oral) Active. Florastor (250MG Packet, Oral) Active. Sodium Bicarbonate (650MG Tablet, Oral) Active. Lasix (40MG Tablet, Oral) Active. NexIUM (40MG Capsule DR, Oral) Active. Qvar (80MCG/ACT Aerosol Soln, Inhalation) Active. Proventil HFA (108 (90 Base)MCG/ACT Aerosol Soln, Inhalation) Active. Synthroid (125MCG Tablet, Oral) Active. Spiriva HandiHaler (18MCG Capsule, Inhalation) Active. Norco (5-325MG Tablet, Oral) Active. Coumadin (5MG Tablet, Oral) Active. Hydrocortisone Acetate (10MG Suppository, Rectal) Active. Betimol (0.25% Solution, Ophthalmic) Active. MAGnesium-Oxide (400 (241.3 Mg)MG Tablet, Oral) Active. Medications Reconciled Potassium Chloride Crys ER (20MEQ Tablet ER, Oral) Active. PredniSONE (20MG Tablet, Oral) Active. Warfarin Sodium (5MG Tablet, Oral) Active. Amoxicillin-Pot Clavulanate (500-125MG Tablet, Oral) Active.  Social History [name], MD; 6/0/6301 5:04 PM) Caffeine use Carbonated beverages, Coffee, Tea. Alcohol use Remotely quit alcohol use. Tobacco use Former smoker. No drug use  Family History [name], MD; 6/0/1093 5:04 PM) Respiratory Condition Mother. Hypertension Brother, Father, Mother. Diabetes Mellitus Mother. Arthritis Mother. Heart disease in female family member before age 38 Heart Disease Brother, Father.  Pregnancy / Birth History [name], MD; 06/29/5571 5:04 PM) Para 3 Maternal age 63-20 Gravida 52 Age of menopause 48-55 Age at menarche 49 years.    Vitals Claiborne Billings Dockery LPN; 06/27/252 2:70 PM) 10/25/2014 4:34 PM Weight: 106.6 lb Height: 64in Body Surface Area: 1.48 m Body Mass Index: 18.3 kg/m Temp.: 98.13F(Oral)   Pulse: 103 (Regular)  BP: 116/74 (Sitting, Left Arm, Standard)     Physical Exam [name] MD; 10/25/3760 5:06 PM)  General Mental Status-Alert. General Appearance-Consistent with stated age. Hydration-Well hydrated. Voice-Normal.  Head and Neck Head-normocephalic, atraumatic with no lesions or palpable masses. Trachea-midline. Thyroid Gland Characteristics - normal size and  consistency.  Eye Eyeball - Bilateral-Extraocular movements intact. Sclera/Conjunctiva - Bilateral-No scleral icterus.  Chest and Lung Exam Chest and lung exam reveals -quiet, even and easy respiratory effort with no use of accessory muscles and on auscultation, normal breath sounds, no adventitious sounds and normal vocal resonance. Inspection Chest Wall - Normal. Back - normal.  Cardiovascular Cardiovascular examination reveals -normal heart sounds, regular rate and rhythm with no murmurs and normal pedal pulses bilaterally.  Abdomen Inspection Inspection of the abdomen reveals - No Hernias. Palpation/Percussion Palpation and Percussion of the abdomen reveal - Soft, Non Tender, No Rebound tenderness, No Rigidity (guarding) and No hepatosplenomegaly. Auscultation Auscultation of the abdomen reveals - Bowel sounds normal.  Rectal Anorectal Exam External - Note: Previous abscess site noted in patient's right lateral perirectal area. Purulence can be expressed from this site as well as several other areas consistent with perianal Crohn's disease.  Neurologic Neurologic evaluation reveals -alert and oriented x 3 with no impairment of recent or remote memory. Mental Status-Normal.  Musculoskeletal Global Assessment -Note:no gross deformities.  Normal Exam - Left-Upper Extremity Strength Normal and Lower Extremity Strength Normal. Normal Exam - Right-Upper Extremity Strength Normal and Lower Extremity Strength Normal.    Assessment & Plan [name] MD;  11/30/6752 4:50 PM)  PERIANAL CROHN'S DISEASE, WITH FISTULA (555.1  K50.113) Story: Our surgery scheduler will call you to determine an appointment for surgery. Impression: 69 year old female status post I and D of perirectal abscess. Patient has a history of Crohn's disease. She continues to have persistent drainage from her abscess I&D site. Her exam findings are consistent with persistent perirectal fistula and Crohn's disease. I would like her to undergo an exam under anesthesia with seton placement through her fistula. I would then like her to be evaluated by Dr. Wynetta Emery for possible biologic therapy.

## 2014-10-26 ENCOUNTER — Encounter (HOSPITAL_COMMUNITY)
Admission: RE | Admit: 2014-10-26 | Discharge: 2014-10-26 | Disposition: A | Discharge status: Home / Self Care | Payer: Medicare Other | Source: Ambulatory Visit | Attending: Nephrology | Admitting: Nephrology

## 2014-10-26 DIAGNOSIS — D509 Iron deficiency anemia, unspecified: Secondary | ICD-10-CM | POA: Insufficient documentation

## 2014-10-26 MED ORDER — SODIUM CHLORIDE 0.9 % IV SOLN
510.0000 mg | INTRAVENOUS | Status: AC
  Administered 2014-10-26: 510 mg via INTRAVENOUS
  Filled 2014-10-26: qty 17

## 2014-10-27 DIAGNOSIS — D509 Iron deficiency anemia, unspecified: Secondary | ICD-10-CM | POA: Diagnosis not present

## 2014-11-28 ENCOUNTER — Encounter (HOSPITAL_BASED_OUTPATIENT_CLINIC_OR_DEPARTMENT_OTHER): Payer: Self-pay | Admitting: *Deleted

## 2014-11-28 NOTE — Progress Notes (Addendum)
NPO AFTER MN.  ARRIVE AT 0930.  NEEDS ISTAT 8.  WILL TAKE AM MEDS AND DO QVAR/ SPIRIVA INHALER'S AM DOS W/ SIPS OF WATER.  REVIEWED CHART W/ DR B. JUDD MDA,  STATED WILL REVEWED WITH DR DENENNY MDA TODAY AND GET TO ME IF OK TO PROCEED AT THIS FACILITY.  DR B. JUDD MDA AND DR DENENNY MDA , STATED OK TO PROCEED SINCE PT IS NOT ON OXYGEN AND PT DID OK W/ COLONOSCOPY IN MAY 2016 WITHOUT PULMONARY ISSUES.  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

## 2014-12-01 ENCOUNTER — Ambulatory Visit (HOSPITAL_BASED_OUTPATIENT_CLINIC_OR_DEPARTMENT_OTHER)
Admission: RE | Admit: 2014-12-01 | Discharge: 2014-12-01 | Disposition: A | Discharge status: Home / Self Care | Payer: Medicare Other | Source: Ambulatory Visit | Attending: General Surgery | Admitting: General Surgery

## 2014-12-01 ENCOUNTER — Ambulatory Visit (HOSPITAL_BASED_OUTPATIENT_CLINIC_OR_DEPARTMENT_OTHER): Payer: Medicare Other | Admitting: Anesthesiology

## 2014-12-01 ENCOUNTER — Encounter (HOSPITAL_BASED_OUTPATIENT_CLINIC_OR_DEPARTMENT_OTHER): Payer: Self-pay | Admitting: Certified Registered"

## 2014-12-01 ENCOUNTER — Encounter (HOSPITAL_BASED_OUTPATIENT_CLINIC_OR_DEPARTMENT_OTHER)
Admission: RE | Disposition: A | Discharge status: Home / Self Care | Payer: Self-pay | Source: Ambulatory Visit | Attending: General Surgery

## 2014-12-01 DIAGNOSIS — K624 Stenosis of anus and rectum: Secondary | ICD-10-CM | POA: Diagnosis not present

## 2014-12-01 DIAGNOSIS — K219 Gastro-esophageal reflux disease without esophagitis: Secondary | ICD-10-CM | POA: Insufficient documentation

## 2014-12-01 DIAGNOSIS — Z87891 Personal history of nicotine dependence: Secondary | ICD-10-CM | POA: Insufficient documentation

## 2014-12-01 DIAGNOSIS — E079 Disorder of thyroid, unspecified: Secondary | ICD-10-CM | POA: Insufficient documentation

## 2014-12-01 DIAGNOSIS — J449 Chronic obstructive pulmonary disease, unspecified: Secondary | ICD-10-CM | POA: Insufficient documentation

## 2014-12-01 DIAGNOSIS — J45909 Unspecified asthma, uncomplicated: Secondary | ICD-10-CM | POA: Diagnosis not present

## 2014-12-01 DIAGNOSIS — Z79899 Other long term (current) drug therapy: Secondary | ICD-10-CM | POA: Diagnosis not present

## 2014-12-01 DIAGNOSIS — K603 Anal fistula: Secondary | ICD-10-CM | POA: Diagnosis present

## 2014-12-01 DIAGNOSIS — E039 Hypothyroidism, unspecified: Secondary | ICD-10-CM | POA: Diagnosis not present

## 2014-12-01 HISTORY — DX: Noninfective gastroenteritis and colitis, unspecified: K52.9

## 2014-12-01 HISTORY — DX: Chronic kidney disease, stage 4 (severe): N18.4

## 2014-12-01 HISTORY — DX: Personal history of other diseases of the nervous system and sense organs: Z86.69

## 2014-12-01 HISTORY — DX: Personal history of other diseases of the digestive system: Z87.19

## 2014-12-01 HISTORY — DX: Elevated blood-pressure reading, without diagnosis of hypertension: R03.0

## 2014-12-01 HISTORY — DX: Anal fistula, unspecified: K60.30

## 2014-12-01 HISTORY — DX: Crohn's disease of large intestine without complications: K50.10

## 2014-12-01 HISTORY — DX: Presence of dental prosthetic device (complete) (partial): Z97.2

## 2014-12-01 HISTORY — DX: Anal fistula: K60.3

## 2014-12-01 HISTORY — DX: Anemia in chronic kidney disease: N18.9

## 2014-12-01 HISTORY — DX: Crohn's disease, unspecified, without complications: K50.90

## 2014-12-01 HISTORY — DX: Anemia in chronic kidney disease: D63.1

## 2014-12-01 HISTORY — DX: Pain in unspecified joint: M25.50

## 2014-12-01 HISTORY — PX: RECTAL EXAM UNDER ANESTHESIA: SHX6399

## 2014-12-01 HISTORY — DX: Postprocedural hypothyroidism: E89.0

## 2014-12-01 HISTORY — PX: PLACEMENT OF SETON: SHX6029

## 2014-12-01 LAB — POCT I-STAT, CHEM 8
BUN: 33 mg/dL — ABNORMAL HIGH (ref 6–20)
CHLORIDE: 108 mmol/L (ref 101–111)
Calcium, Ion: 1.31 mmol/L — ABNORMAL HIGH (ref 1.13–1.30)
Creatinine, Ser: 3.1 mg/dL — ABNORMAL HIGH (ref 0.44–1.00)
GLUCOSE: 97 mg/dL (ref 65–99)
HCT: 45 % (ref 36.0–46.0)
Hemoglobin: 15.3 g/dL — ABNORMAL HIGH (ref 12.0–15.0)
Potassium: 2.4 mmol/L — CL (ref 3.5–5.1)
Sodium: 143 mmol/L (ref 135–145)
TCO2: 18 mmol/L (ref 0–100)

## 2014-12-01 LAB — POTASSIUM
POTASSIUM: 3 mmol/L — AB (ref 3.5–5.1)
Potassium: 2.7 mmol/L — CL (ref 3.5–5.1)

## 2014-12-01 SURGERY — EXAM UNDER ANESTHESIA, RECTUM
Anesthesia: Monitor Anesthesia Care | Site: Rectum

## 2014-12-01 MED ORDER — MIDAZOLAM HCL 2 MG/2ML IJ SOLN
INTRAMUSCULAR | Status: AC
  Filled 2014-12-01: qty 2

## 2014-12-01 MED ORDER — SODIUM CHLORIDE 0.9 % IJ SOLN
3.0000 mL | Freq: Two times a day (BID) | INTRAMUSCULAR | Status: DC
  Filled 2014-12-01: qty 3

## 2014-12-01 MED ORDER — MORPHINE SULFATE 4 MG/ML IJ SOLN
4.0000 mg | Freq: Once | INTRAMUSCULAR | Status: AC
Start: 2014-12-01 — End: 2014-12-01
  Administered 2014-12-01: 4 mg via INTRAVENOUS
  Filled 2014-12-01: qty 1

## 2014-12-01 MED ORDER — SODIUM CHLORIDE 0.9 % IJ SOLN
3.0000 mL | INTRAMUSCULAR | Status: DC | PRN
  Filled 2014-12-01: qty 3

## 2014-12-01 MED ORDER — FENTANYL CITRATE (PF) 100 MCG/2ML IJ SOLN
25.0000 ug | INTRAMUSCULAR | Status: DC | PRN
  Filled 2014-12-01: qty 1

## 2014-12-01 MED ORDER — BUPIVACAINE-EPINEPHRINE 0.5% -1:200000 IJ SOLN
INTRAMUSCULAR | Status: DC | PRN
  Administered 2014-12-01: 50 mL

## 2014-12-01 MED ORDER — POTASSIUM CHLORIDE ER 10 MEQ PO TBCR: 10.0000 meq | EXTENDED_RELEASE_TABLET | Freq: Two times a day (BID) | ORAL | Status: DC

## 2014-12-01 MED ORDER — POTASSIUM CHLORIDE 10 MEQ/100ML IV SOLN
10.0000 meq | INTRAVENOUS | Status: DC
Start: 2014-12-01 — End: 2014-12-01
  Administered 2014-12-01: 10 meq via INTRAVENOUS
  Filled 2014-12-01 (×7): qty 100

## 2014-12-01 MED ORDER — BUPIVACAINE LIPOSOME 1.3 % IJ SUSP
INTRAMUSCULAR | Status: DC | PRN
  Administered 2014-12-01: 20 mL

## 2014-12-01 MED ORDER — ACETAMINOPHEN 650 MG RE SUPP
650.0000 mg | RECTAL | Status: DC | PRN
  Filled 2014-12-01: qty 1

## 2014-12-01 MED ORDER — POTASSIUM CHLORIDE 10 MEQ/100ML IV SOLN
10.0000 meq | INTRAVENOUS | Status: AC
  Administered 2014-12-01 (×3): 10 meq via INTRAVENOUS
  Filled 2014-12-01: qty 100

## 2014-12-01 MED ORDER — MORPHINE SULFATE 4 MG/ML IJ SOLN
INTRAMUSCULAR | Status: AC
  Filled 2014-12-01: qty 1

## 2014-12-01 MED ORDER — OXYCODONE HCL 5 MG PO TABS
5.0000 mg | ORAL_TABLET | ORAL | Status: DC | PRN
  Administered 2014-12-01 (×2): 5 mg via ORAL
  Filled 2014-12-01: qty 2

## 2014-12-01 MED ORDER — KETAMINE HCL 50 MG/ML IJ SOLN
INTRAMUSCULAR | Status: AC
  Filled 2014-12-01: qty 10

## 2014-12-01 MED ORDER — OXYCODONE HCL 5 MG PO TABS
ORAL_TABLET | ORAL | Status: AC
  Filled 2014-12-01: qty 1

## 2014-12-01 MED ORDER — LACTATED RINGERS IV SOLN
INTRAVENOUS | Status: DC
  Filled 2014-12-01: qty 1000

## 2014-12-01 MED ORDER — FENTANYL CITRATE (PF) 100 MCG/2ML IJ SOLN
INTRAMUSCULAR | Status: AC
  Filled 2014-12-01: qty 4

## 2014-12-01 MED ORDER — OXYCODONE HCL 5 MG PO TABS: 5.0000 mg | ORAL_TABLET | ORAL | Status: DC | PRN

## 2014-12-01 MED ORDER — SODIUM CHLORIDE 0.9 % IV SOLN
INTRAVENOUS | Status: DC
Start: 2014-12-01 — End: 2014-12-01
  Administered 2014-12-01: 10:00:00 via INTRAVENOUS
  Filled 2014-12-01: qty 1000

## 2014-12-01 MED ORDER — ACETAMINOPHEN 325 MG PO TABS
650.0000 mg | ORAL_TABLET | ORAL | Status: DC | PRN
  Filled 2014-12-01: qty 2

## 2014-12-01 MED ORDER — SODIUM CHLORIDE 0.9 % IV SOLN
250.0000 mL | INTRAVENOUS | Status: DC | PRN
  Filled 2014-12-01: qty 250

## 2014-12-01 SURGICAL SUPPLY — 32 items
BLADE SURG 15 STRL LF DISP TIS (BLADE) ×1 IMPLANT
BLADE SURG 15 STRL SS (BLADE) ×3
BRIEF STRETCH FOR OB PAD LRG (UNDERPADS AND DIAPERS) ×4 IMPLANT
COVER BACK TABLE 60X90IN (DRAPES) ×3 IMPLANT
COVER MAYO STAND STRL (DRAPES) ×3 IMPLANT
DRAPE LG THREE QUARTER DISP (DRAPES) ×4 IMPLANT
DRAPE PED LAPAROTOMY (DRAPES) ×3 IMPLANT
DRAPE UTILITY XL STRL (DRAPES) ×3 IMPLANT
DRSG PAD ABDOMINAL 8X10 ST (GAUZE/BANDAGES/DRESSINGS) ×2 IMPLANT
ELECT REM PT RETURN 9FT ADLT (ELECTROSURGICAL) ×3
ELECTRODE REM PT RTRN 9FT ADLT (ELECTROSURGICAL) ×1 IMPLANT
GLOVE BIO SURGEON STRL SZ 6.5 (GLOVE) ×4 IMPLANT
GLOVE BIO SURGEONS STRL SZ 6.5 (GLOVE) ×2
GLOVE INDICATOR 7.0 STRL GRN (GLOVE) ×6 IMPLANT
GOWN STRL REUS W/ TWL LRG LVL3 (GOWN DISPOSABLE) ×1 IMPLANT
GOWN STRL REUS W/TWL 2XL LVL3 (GOWN DISPOSABLE) ×3 IMPLANT
GOWN STRL REUS W/TWL LRG LVL3 (GOWN DISPOSABLE) ×3
LOOP VESSEL MAXI BLUE (MISCELLANEOUS) ×2 IMPLANT
NDL HYPO 25X1 1.5 SAFETY (NEEDLE) ×1 IMPLANT
NDL SAFETY ECLIPSE 18X1.5 (NEEDLE) IMPLANT
NEEDLE HYPO 18GX1.5 SHARP (NEEDLE) ×3
NEEDLE HYPO 25X1 1.5 SAFETY (NEEDLE) ×3 IMPLANT
NS IRRIG 500ML POUR BTL (IV SOLUTION) ×3 IMPLANT
PACK BASIN DAY SURGERY FS (CUSTOM PROCEDURE TRAY) ×3 IMPLANT
PAD ABD 8X10 STRL (GAUZE/BANDAGES/DRESSINGS) ×2 IMPLANT
PENCIL BUTTON HOLSTER BLD 10FT (ELECTRODE) ×3 IMPLANT
SPONGE GAUZE 4X4 12PLY STER LF (GAUZE/BANDAGES/DRESSINGS) ×2 IMPLANT
SUT ETHIBOND 0 (SUTURE) ×2 IMPLANT
SYR CONTROL 10ML LL (SYRINGE) ×3 IMPLANT
TOWEL OR 17X24 6PK STRL BLUE (TOWEL DISPOSABLE) ×3 IMPLANT
TRAY DSU PREP LF (CUSTOM PROCEDURE TRAY) ×3 IMPLANT
YANKAUER SUCT BULB TIP NO VENT (SUCTIONS) ×3 IMPLANT

## 2014-12-01 NOTE — Anesthesia Preprocedure Evaluation (Addendum)
Anesthesia Evaluation  Patient identified by MRN, date of birth, ID band Patient awake    Reviewed: Allergy & Precautions, NPO status , Patient's Chart, lab work & pertinent test results  Airway Mallampati: II  TM Distance: >3 FB Neck ROM: Full    Dental no notable dental hx. (+) Dental Advisory Given   Pulmonary shortness of breath and with exertion, COPD COPD inhaler, former smoker,  breath sounds clear to auscultation  Pulmonary exam normal       Cardiovascular hypertension, + Peripheral Vascular Disease and +CHF Normal cardiovascular examRhythm:Regular Rate:Normal  EF 55% echo   Neuro/Psych negative neurological ROS  negative psych ROS   GI/Hepatic negative GI ROS, Neg liver ROS, GERD-  ,  Endo/Other  Hypothyroidism   Renal/GU Renal InsufficiencyRenal diseaseCRT 2.1  negative genitourinary   Musculoskeletal  (+) Arthritis -,   Abdominal   Peds negative pediatric ROS (+)  Hematology  (+) anemia ,   Anesthesia Other Findings   Reproductive/Obstetrics negative OB ROS                            Anesthesia Physical Anesthesia Plan  ASA: III  Anesthesia Plan: MAC   Post-op Pain Management:    Induction:   Airway Management Planned:   Additional Equipment:   Intra-op Plan:   Post-operative Plan:   Informed Consent:   Plan Discussed with: Surgeon  Anesthesia Plan Comments:         Anesthesia Quick Evaluation

## 2014-12-01 NOTE — Progress Notes (Signed)
Potassium level 3. Called to Dr. Marcello Moores, okay to be discharged home.

## 2014-12-01 NOTE — Progress Notes (Signed)
Lab called with critical potassium of 2.7, called to OR room and given results to K. Leafy Ro, RN and reported to Dr. Landry Dyke.

## 2014-12-01 NOTE — Discharge Instructions (Addendum)
ANORECTAL SURGERY: POST OP INSTRUCTIONS 1. Take your usually prescribed home medications unless otherwise directed. 2. DIET: During the first few hours after surgery sip on some liquids until you are able to urinate.  It is normal to not urinate for several hours after this surgery.  If you feel uncomfortable, please contact the office for instructions.  After you are able to urinate,you may eat, if you feel like it.  Follow a light bland diet the first 24 hours after arrival home, such as soup, liquids, crackers, etc.  Be sure to include lots of fluids daily (6-8 glasses).  Avoid fast food or heavy meals, as your are more likely to get nauseated.  Eat a low fat diet the next few days after surgery.  Limit caffeine intake to 1-2 servings a day. 3. PAIN CONTROL: a. Pain is best controlled by a usual combination of several different methods TOGETHER: i. Muscle relaxation: Soak in a warm bath (or Sitz bath) three times a day and after bowel movements.  Continue to do this until all pain is resolved. ii. Over the counter pain medication iii. Prescription pain medication b. Most patients will experience some swelling and discomfort in the anus/rectal area and incisions.  Heat such as warm towels, sitz baths, warm baths, etc to help relax tight/sore spots and speed recovery.  Some people prefer to use ice, especially in the first couple days after surgery, as it may decrease the pain and swelling, or alternate between ice & heat.  Experiment to what works for you.  Swelling and bruising can take several weeks to resolve.  Pain can take even longer to completely resolve. c. It is helpful to take an over-the-counter pain medication regularly for the first few weeks.  Choose one of the following that works best for you: i. Naproxen (Aleve, etc)  Two 237m tabs twice a day ii. Ibuprofen (Advil, etc) Three 2081mtabs four times a day (every meal & bedtime) d. A  prescription for pain medication (such as percocet,  oxycodone, hydrocodone, etc) should be given to you upon discharge.  Take your pain medication as prescribed.  i. If you are having problems/concerns with the prescription medicine (does not control pain, nausea, vomiting, rash, itching, etc), please call [phone] see if we need to switch you to a different pain medicine that will work better for you and/or control your side effect better. ii. If you need a refill on your pain medication, please contact your pharmacy.  They will contact our office to request authorization. Prescriptions will not be filled after 5 pm or on week-ends. 4. KEEP YOUR BOWELS REGULAR and AVOID CONSTIPATION a. The goal is one to two soft bowel movements a day.  You should at least have a bowel movement every other day. b. Avoid getting constipated.  Between the surgery and the pain medications, it is common to experience some constipation. This can be very painful after rectal surgery.  Increasing fluid intake and taking a fiber supplement (such as Metamucil, Citrucel, FiberCon, etc) 1-2 times a day regularly will usually help prevent this problem from occurring.  A stool softener like colace is also recommended.  This can be purchased over the counter at your pharmacy.  You can take it up to 3 times a day.  If you do not have a bowel movement after 24 hrs since your surgery, take one does of milk of magnesia.  If you still haven't had a bowel movement 8-12 hours after  that dose, take another dose.  If you don't have a bowel movement 48 hrs after surgery, purchase a Fleets enema from the drug store and administer gently per package instructions.  If you still are having trouble with your bowel movements after that, please call the office for further instructions. c. If you develop diarrhea or have many loose bowel movements, simplify your diet to bland foods & liquids for a few days.  Stop any stool softeners and decrease your fiber supplement.  Switching to mild  anti-diarrheal medications (Kayopectate, Pepto Bismol) can help.  If this worsens or does not improve, please call us.  5. Wound Care a. Remove your bandages before your first bowel movement or 8 hours after surgery.     b. Remove any wound packing material at this tim,e as well.  You do not need to repack the wound unless instructed otherwise.  Wear an absorbent pad or soft cotton gauze in your underwear to catch any drainage and help keep the area clean. You should change this every 2-3 hours while awake. c. Keep the area clean and dry.  Bathe / shower every day, especially after bowel movements.  Keep the area clean by showering / bathing over the incision / wound.   It is okay to soak an open wound to help wash it.  Wet wipes or showers / gentle washing after bowel movements is often less traumatic than regular toilet paper. d. Dennis Bast may have some styrofoam-like soft packing in the rectum which will come out with the first bowel movement.  e. You will often notice bleeding with bowel movements.  This should slow down by the end of the first week of surgery f. Expect some drainage.  This should slow down, too, by the end of the first week of surgery.  Wear an absorbent pad or soft cotton gauze in your underwear until the drainage stops. g. Do Not sit on a rubber or pillow ring.  This can make you symptoms worse.  You may sit on a soft pillow if needed.  6. ACTIVITIES as tolerated:   a. You may resume regular (light) daily activities beginning the next day--such as daily self-care, walking, climbing stairs--gradually increasing activities as tolerated.  If you can walk 30 minutes without difficulty, it is safe to try more intense activity such as jogging, treadmill, bicycling, low-impact aerobics, swimming, etc. b. Save the most intensive and strenuous activity for last such as sit-ups, heavy lifting, contact sports, etc  Refrain from any heavy lifting or straining until you are off narcotics for pain  control.   c. You may drive when you are no longer taking prescription pain medication, you can comfortably sit for long periods of time, and you can safely maneuver your car and apply brakes. d. Dennis Bast may have sexual intercourse when it is comfortable.  7. FOLLOW UP in our office a. Please call [facility] at [phone] to set up an appointment to see your surgeon in the office for a follow-up appointment approximately 3-4 weeks after your surgery. b. Make sure that you call for this appointment the day you arrive home to insure a convenient appointment time. 10. IF YOU HAVE DISABILITY OR FAMILY LEAVE FORMS, BRING THEM TO THE OFFICE FOR PROCESSING.  DO NOT GIVE THEM TO YOUR DOCTOR.     WHEN TO CALL us [phone]: 1. Poor pain control 2. Reactions / problems with new medications (rash/itching, nausea, etc)  3. Fever over 101.5 F (38.5 C) 4.  Inability to urinate 5. Nausea and/or vomiting 6. Worsening swelling or bruising 7. Continued bleeding from incision. 8. Increased pain, redness, or drainage from the incision  The clinic staff is available to answer your questions during regular business hours (8:30am-5pm).  Please dont hesitate to call and ask to speak to one of our nurses for clinical concerns.   A surgeon from [facility] is always on call at the hospitals   If you have a medical emergency, go to the nearest emergency room or call 911.    [facility], [place] [place], [place], [place],   [address] ? MAIN: [phone]) [phone] ? TOLL FREE: [phone] ? FAX [phone] [email or web address]    Post Anesthesia Home Care Instructions  Activity: Get plenty of rest for the remainder of the day. A responsible adult should stay with you for 24 hours following the procedure.  For the next 24 hours, DO NOT: -Drive a car -Paediatric nurse -Drink alcoholic beverages -Take any medication unless instructed by your physician -Make  any legal decisions or sign important papers.  Meals: Start with liquid foods such as gelatin or soup. Progress to regular foods as tolerated. Avoid greasy, spicy, heavy foods. If nausea and/or vomiting occur, drink only clear liquids until the nausea and/or vomiting subsides. Call your physician if vomiting continues.  Special Instructions/Symptoms: Your throat may feel dry or sore from the anesthesia or the breathing tube placed in your throat during surgery. If this causes discomfort, gargle with warm salt water. The discomfort should disappear within 24 hours.  If you had a scopolamine patch placed behind your ear for the management of post- operative nausea and/or vomiting:  1. The medication in the patch is effective for 72 hours, after which it should be removed.  Wrap patch in a tissue and discard in the trash. Wash hands thoroughly with soap and water. 2. You may remove the patch earlier than 72 hours if you experience unpleasant side effects which may include dry mouth, dizziness or visual disturbances. 3. Avoid touching the patch. Wash your hands with soap and water after contact with the patch.  everages  -  Start with liquid foods such as gelatin or soup. Progress to regular foods as tolerated. Avoid greasy, spicy, heavy foods. If nausea and/or vomiting occur, drink only clear liquids until the nausea and/or vomiting subsides. Call your physician if vomiting continues.  1. The medication in the patch is effective for 72 hours, after which it should be removed.  Wrap patch in a tissue and discard in the trash. Wash hands thoroughly with soap and water. 2. You may remove the patch earlier than 72 hours if you experience unpleasant side effects which may include dry mouth, dizziness or visual disturbances. 3. Avoid touching the patch. Wash your hands with soap and water after contact with the patch.

## 2014-12-01 NOTE — Op Note (Signed)
12/01/2014  12:11 PM  PATIENT:  Natasha [name]  69 y.o. female  Patient Care Team: Lottie Dawson, MD as PCP - General (Internal Medicine) Fleet Contras, MD as Consulting Physician (Nephrology)  PRE-OPERATIVE DIAGNOSIS:   anal fistala  POST-OPERATIVE DIAGNOSIS:  anal fistala  PROCEDURE:   RECTAL EXAM UNDER ANESTHESIA PLACEMENT OF SETON   Surgeon(s): [name], MD  ASSISTANT: none   ANESTHESIA:   local  SPECIMEN:  No Specimen  DISPOSITION OF SPECIMEN:  N/A  COUNTS:  YES  PLAN OF CARE: Discharge to home after PACU  PATIENT DISPOSITION:  PACU - hemodynamically stable.  INDICATION: 69 y.o. F with Crohn's disease and evidence of perirectal fistula.  She is getting ready to start biologics (Humira) with Dr Wynetta Emery. She was found to have a potassium level of 2.7 during her preoperative workup and it was decided to undergo the procedure under local anesthesia.   OR FINDINGS: posterior midline internal opening fistulizing to R gluteus.  Blind L lateral fistula tract without purulence   DESCRIPTION: the patient was identified in the preoperative holding area and taken to the OR where they were laid on the operating room table. The patient was then positioned in lithotomy position.  The patient was then prepped and draped in usual sterile fashion.  SCDs were noted to be in place prior to the initiation of anesthesia. A surgical timeout was performed indicating the correct patient, procedure, positioning and need for preoperative antibiotics.  A rectal block was performed using Marcaine with epinephrine and Xparel.    I began with a digital rectal exam.  There were no fluctuant masses.  There was slight anal stenosis.  I then placed a small Hill-Ferguson anoscope into the anal canal and evaluated this completely.  There was a posterior midline defect that appeared to be the source of the fistula.  There was a small punctate lesion in the right lateral position. A  fistula probe was inserted and this tract laterally and ended blindly. It did not track into the vagina or the rectum. There is no purulence associated with this. I opened up the skin around this tract to allow for it to drain and heal. I then turned my attention to a lesion in the patient's right gluteus at midline I was able to advance an S-shaped fistula probe into the posterior midline defect of the anal canal. An Ethibond suture was placed through this and a vessel loop was then pulled through the fistula tract. This was secured into place with the Ethibond suture creating a seton. After this was completed, a sterile dressing was applied. The patient was transferred to the postanesthesia care unit in stable condition.

## 2014-12-01 NOTE — H&P (Signed)
The patient is a 69 year old female who presents with anal fistula. 68 year old female who presents to the office with persistent perirectal drainage after incision and drainage of a perirectal abscess performed approximately 6 weeks ago. Patient with a history of Crohn's disease currently on prednisone taper for active Crohn's disease seen on colonoscopy while in the hospital. She reports 3-4 bowel movements a day. She denies any blood in her stools. She complains of persistent pain in her perirectal area along with the purulent drainage. She denies any fecal leakage. She has never had any major anorectal procedures before.  Problem List/Past Medical [name], MD; 08/28/8097 5:06 PM) POSTOPERATIVE STATE (V45.89  Z98.89) PERIANAL CROHN'S DISEASE, WITH FISTULA (555.1  K50.113)  Other Problems [name], MD; 12/27/3823 5:06 PM) Ulcerative Colitis Thyroid Disease Thyroid Cancer Crohn's Disease Colon Cancer Pulmonary Embolism / Blood Clot in Legs Gastroesophageal Reflux Disease Chronic Obstructive Lung Disease Back Pain Arthritis  Past Surgical History [name], MD; 0/09/3974 5:04 PM) Foot Surgery Left. Colon Removal - Partial Resection of Small Bowel Hysterectomy (not due to cancer) - Complete Cataract Surgery Left. Thyroid Surgery  Diagnostic Studies History [name], MD; 11/26/4191 5:04 PM) Mammogram 1-3 years ago Colonoscopy within last year Pap Smear 1-5 years ago  Allergies Mammie Lorenzo, LPN; 12/02/238 9:73 PM) No Known Drug Allergies 10/17/2014  Medication History [name], MD; 09/26/2990 5:04 PM) Fluticasone Propionate (Nasal) (50MCG/ACT Suspension, Nasal) Active. PriLOSEC (40MG Capsule DR, Oral) Active. Flexeril (10MG Tablet, Oral) Active. Potassium Chloride ER (20MEQ Tablet ER, Oral) Active. Phenergan (25MG Tablet, Oral) Active. Calcitriol (0.25MCG Capsule, Oral) Active. Folic Acid (1MG Tablet, Oral)  Active. Florastor (250MG Packet, Oral) Active. Sodium Bicarbonate (650MG Tablet, Oral) Active. Lasix (40MG Tablet, Oral) Active. NexIUM (40MG Capsule DR, Oral) Active. Qvar (80MCG/ACT Aerosol Soln, Inhalation) Active. Proventil HFA (108 (90 Base)MCG/ACT Aerosol Soln, Inhalation) Active. Synthroid (125MCG Tablet, Oral) Active. Spiriva HandiHaler (18MCG Capsule, Inhalation) Active. Norco (5-325MG Tablet, Oral) Active. Coumadin (5MG Tablet, Oral) Active. Hydrocortisone Acetate (10MG Suppository, Rectal) Active. Betimol (0.25% Solution, Ophthalmic) Active. MAGnesium-Oxide (400 (241.3 Mg)MG Tablet, Oral) Active. Medications Reconciled Potassium Chloride Crys ER (20MEQ Tablet ER, Oral) Active. PredniSONE (20MG Tablet, Oral) Active. Warfarin Sodium (5MG Tablet, Oral) Active. Amoxicillin-Pot Clavulanate (500-125MG Tablet, Oral) Active.  Social History [name], MD; 08/25/6832 5:04 PM) Caffeine use Carbonated beverages, Coffee, Tea. Alcohol use Remotely quit alcohol use. Tobacco use Former smoker. No drug use  Family History [name], MD; 06/03/6220 5:04 PM) Respiratory Condition Mother. Hypertension Brother, Father, Mother. Diabetes Mellitus Mother. Arthritis Mother. Heart disease in female family member before age 69 Heart Disease Brother, Father.  Pregnancy / Birth History [name], MD; 01/31/9891 5:04 PM) Para 3 Maternal age 17-20 Gravida 24 Age of menopause 14-55 Age at menarche 70 years.  BP 122/62 mmHg  Pulse 109  Temp(Src) 97.4 F (36.3 C) (Oral)  Resp 16  Ht 5' 4"  (1.626 m)  Wt 46.267 kg (102 lb)  BMI 17.50 kg/m2  SpO2 100%  Physical Exam [name] MD; 05/27/9415 5:06 PM)  General Mental Status-Alert. General Appearance-Consistent with stated age. Hydration-Well hydrated. Voice-Normal.  Head and Neck Head-normocephalic, atraumatic with no lesions or palpable masses. Trachea-midline. Thyroid Gland  Characteristics - normal size and consistency.  Eye Eyeball - Bilateral-Extraocular movements intact. Sclera/Conjunctiva - Bilateral-No scleral icterus.  Chest and Lung Exam Chest and lung exam reveals -quiet, even and easy respiratory effort with no use of accessory muscles and on auscultation, normal breath sounds, no adventitious sounds and normal vocal resonance.  Inspection Chest Wall - Normal. Back - normal.  Cardiovascular Cardiovascular examination reveals -normal heart sounds, regular rate and rhythm with no murmurs and normal pedal pulses bilaterally.  Abdomen Inspection Inspection of the abdomen reveals - No Hernias. Palpation/Percussion Palpation and Percussion of the abdomen reveal - Soft, Non Tender, No Rebound tenderness, No Rigidity (guarding) and No hepatosplenomegaly. Auscultation Auscultation of the abdomen reveals - Bowel sounds normal.  Rectal Anorectal Exam External - Note: Previous abscess site noted in patient's right lateral perirectal area. Purulence can be expressed from this site as well as several other areas consistent with perianal Crohn's disease.  Neurologic Neurologic evaluation reveals -alert and oriented x 3 with no impairment of recent or remote memory. Mental Status-Normal.  Musculoskeletal Global Assessment -Note:no gross deformities.  Normal Exam - Left-Upper Extremity Strength Normal and Lower Extremity Strength Normal. Normal Exam - Right-Upper Extremity Strength Normal and Lower Extremity Strength Normal.    Assessment & Plan [name] MD; 05/31/1094 4:50 PM)  PERIANAL CROHN'S DISEASE, WITH FISTULA (555.1  K50.113) Story: Our surgery scheduler will call you to determine an appointment for surgery. Impression: 69 year old female status post I and D of perirectal abscess. Patient has a history of Crohn's disease. She continues to have persistent drainage from her abscess I&D site. Her exam findings are consistent  with persistent perirectal fistula and Crohn's disease. I would like her to undergo an exam under anesthesia with seton placement through her fistula. Dr Wynetta Emery has set her up to start Humira.  Risks of surgery include bleeding, recurrent infection or fistula and incontinence due to scar tissue around her muscles.  I believe she understands this and has agreed to proceed.

## 2014-12-04 ENCOUNTER — Encounter (HOSPITAL_BASED_OUTPATIENT_CLINIC_OR_DEPARTMENT_OTHER): Payer: Self-pay | Admitting: General Surgery

## 2014-12-19 ENCOUNTER — Telehealth: Payer: Self-pay | Admitting: *Deleted

## 2014-12-19 NOTE — Telephone Encounter (Signed)
LMTCB to schedule right arm AVF with Dr. Bridgett Larsson. Our office has let numerous messages to call back. Patient was originally seen 05-05-14.

## 2015-01-22 ENCOUNTER — Ambulatory Visit
Admission: RE | Admit: 2015-01-22 | Discharge: 2015-01-22 | Disposition: A | Discharge status: Home / Self Care | Payer: Medicare Other | Source: Ambulatory Visit | Attending: Internal Medicine | Admitting: Internal Medicine

## 2015-01-22 ENCOUNTER — Other Ambulatory Visit: Payer: Self-pay | Admitting: Internal Medicine

## 2015-01-22 DIAGNOSIS — M7989 Other specified soft tissue disorders: Secondary | ICD-10-CM

## 2015-01-23 ENCOUNTER — Other Ambulatory Visit (HOSPITAL_COMMUNITY): Payer: Self-pay | Admitting: *Deleted

## 2015-01-24 ENCOUNTER — Ambulatory Visit (HOSPITAL_COMMUNITY)
Admission: RE | Admit: 2015-01-24 | Discharge: 2015-01-24 | Disposition: A | Discharge status: Home / Self Care | Payer: Medicare Other | Source: Ambulatory Visit | Attending: Nephrology | Admitting: Nephrology

## 2015-01-24 DIAGNOSIS — D509 Iron deficiency anemia, unspecified: Secondary | ICD-10-CM | POA: Diagnosis present

## 2015-01-24 MED ORDER — SODIUM CHLORIDE 0.9 % IV SOLN
510.0000 mg | INTRAVENOUS | Status: DC
  Administered 2015-01-24: 510 mg via INTRAVENOUS
  Filled 2015-01-24: qty 17

## 2015-01-31 ENCOUNTER — Other Ambulatory Visit (HOSPITAL_COMMUNITY): Payer: Self-pay | Admitting: *Deleted

## 2015-02-01 ENCOUNTER — Ambulatory Visit (HOSPITAL_COMMUNITY)
Admission: RE | Admit: 2015-02-01 | Discharge: 2015-02-01 | Disposition: A | Discharge status: Home / Self Care | Payer: Medicare Other | Source: Ambulatory Visit | Attending: Nephrology | Admitting: Nephrology

## 2015-02-01 DIAGNOSIS — D509 Iron deficiency anemia, unspecified: Secondary | ICD-10-CM | POA: Diagnosis present

## 2015-02-01 MED ORDER — SODIUM CHLORIDE 0.9 % IV SOLN
510.0000 mg | INTRAVENOUS | Status: AC
  Administered 2015-02-01: 510 mg via INTRAVENOUS
  Filled 2015-02-01: qty 17

## 2015-10-12 ENCOUNTER — Emergency Department (HOSPITAL_COMMUNITY): Payer: Medicare Other

## 2015-10-12 ENCOUNTER — Inpatient Hospital Stay (HOSPITAL_COMMUNITY)
Admission: EM | Admit: 2015-10-12 | Discharge: 2015-10-15 | DRG: 191 | Disposition: A | Discharge status: Home / Self Care | Payer: Medicare Other | Attending: Internal Medicine | Admitting: Internal Medicine

## 2015-10-12 ENCOUNTER — Encounter (HOSPITAL_COMMUNITY): Payer: Self-pay

## 2015-10-12 DIAGNOSIS — Z9049 Acquired absence of other specified parts of digestive tract: Secondary | ICD-10-CM

## 2015-10-12 DIAGNOSIS — E876 Hypokalemia: Secondary | ICD-10-CM | POA: Diagnosis present

## 2015-10-12 DIAGNOSIS — M810 Age-related osteoporosis without current pathological fracture: Secondary | ICD-10-CM | POA: Diagnosis present

## 2015-10-12 DIAGNOSIS — E860 Dehydration: Secondary | ICD-10-CM

## 2015-10-12 DIAGNOSIS — N179 Acute kidney failure, unspecified: Secondary | ICD-10-CM | POA: Diagnosis present

## 2015-10-12 DIAGNOSIS — N184 Chronic kidney disease, stage 4 (severe): Secondary | ICD-10-CM | POA: Diagnosis present

## 2015-10-12 DIAGNOSIS — I5032 Chronic diastolic (congestive) heart failure: Secondary | ICD-10-CM | POA: Diagnosis present

## 2015-10-12 DIAGNOSIS — D631 Anemia in chronic kidney disease: Secondary | ICD-10-CM | POA: Diagnosis present

## 2015-10-12 DIAGNOSIS — J441 Chronic obstructive pulmonary disease with (acute) exacerbation: Secondary | ICD-10-CM | POA: Diagnosis present

## 2015-10-12 DIAGNOSIS — I10 Essential (primary) hypertension: Secondary | ICD-10-CM

## 2015-10-12 DIAGNOSIS — Z8249 Family history of ischemic heart disease and other diseases of the circulatory system: Secondary | ICD-10-CM | POA: Diagnosis not present

## 2015-10-12 DIAGNOSIS — Z7951 Long term (current) use of inhaled steroids: Secondary | ICD-10-CM | POA: Diagnosis not present

## 2015-10-12 DIAGNOSIS — Z800 Family history of malignant neoplasm of digestive organs: Secondary | ICD-10-CM

## 2015-10-12 DIAGNOSIS — I130 Hypertensive heart and chronic kidney disease with heart failure and stage 1 through stage 4 chronic kidney disease, or unspecified chronic kidney disease: Secondary | ICD-10-CM | POA: Diagnosis present

## 2015-10-12 DIAGNOSIS — K5080 Crohn's disease of both small and large intestine without complications: Secondary | ICD-10-CM

## 2015-10-12 DIAGNOSIS — E890 Postprocedural hypothyroidism: Secondary | ICD-10-CM | POA: Diagnosis present

## 2015-10-12 DIAGNOSIS — N186 End stage renal disease: Secondary | ICD-10-CM | POA: Diagnosis present

## 2015-10-12 DIAGNOSIS — I119 Hypertensive heart disease without heart failure: Secondary | ICD-10-CM | POA: Diagnosis present

## 2015-10-12 DIAGNOSIS — Z87891 Personal history of nicotine dependence: Secondary | ICD-10-CM | POA: Diagnosis not present

## 2015-10-12 DIAGNOSIS — Z825 Family history of asthma and other chronic lower respiratory diseases: Secondary | ICD-10-CM | POA: Diagnosis not present

## 2015-10-12 DIAGNOSIS — Z833 Family history of diabetes mellitus: Secondary | ICD-10-CM

## 2015-10-12 DIAGNOSIS — K219 Gastro-esophageal reflux disease without esophagitis: Secondary | ICD-10-CM | POA: Diagnosis present

## 2015-10-12 DIAGNOSIS — E872 Acidosis, unspecified: Secondary | ICD-10-CM | POA: Diagnosis present

## 2015-10-12 DIAGNOSIS — N189 Chronic kidney disease, unspecified: Secondary | ICD-10-CM

## 2015-10-12 DIAGNOSIS — I5042 Chronic combined systolic (congestive) and diastolic (congestive) heart failure: Secondary | ICD-10-CM | POA: Diagnosis present

## 2015-10-12 DIAGNOSIS — Z992 Dependence on renal dialysis: Secondary | ICD-10-CM

## 2015-10-12 DIAGNOSIS — K5090 Crohn's disease, unspecified, without complications: Secondary | ICD-10-CM | POA: Diagnosis present

## 2015-10-12 DIAGNOSIS — J449 Chronic obstructive pulmonary disease, unspecified: Secondary | ICD-10-CM | POA: Diagnosis present

## 2015-10-12 LAB — CBC
HEMATOCRIT: 38.1 % (ref 36.0–46.0)
Hemoglobin: 12.1 g/dL (ref 12.0–15.0)
MCH: 31.1 pg (ref 26.0–34.0)
MCHC: 31.8 g/dL (ref 30.0–36.0)
MCV: 97.9 fL (ref 78.0–100.0)
Platelets: 145 10*3/uL — ABNORMAL LOW (ref 150–400)
RBC: 3.89 MIL/uL (ref 3.87–5.11)
RDW: 15.1 % (ref 11.5–15.5)
WBC: 8.7 10*3/uL (ref 4.0–10.5)

## 2015-10-12 LAB — COMPREHENSIVE METABOLIC PANEL
ALBUMIN: 3.1 g/dL — AB (ref 3.5–5.0)
ALT: 15 U/L (ref 14–54)
AST: 15 U/L (ref 15–41)
Alkaline Phosphatase: 148 U/L — ABNORMAL HIGH (ref 38–126)
Anion gap: 11 (ref 5–15)
BUN: 37 mg/dL — AB (ref 6–20)
CO2: 15 mmol/L — ABNORMAL LOW (ref 22–32)
CREATININE: 3.58 mg/dL — AB (ref 0.44–1.00)
Calcium: 8.2 mg/dL — ABNORMAL LOW (ref 8.9–10.3)
Chloride: 113 mmol/L — ABNORMAL HIGH (ref 101–111)
GFR calc Af Amer: 14 mL/min — ABNORMAL LOW (ref >60)
GFR, EST NON AFRICAN AMERICAN: 12 mL/min — AB (ref >60)
GLUCOSE: 109 mg/dL — AB (ref 65–99)
Potassium: 2.6 mmol/L — CL (ref 3.5–5.1)
Sodium: 139 mmol/L (ref 135–145)
TOTAL PROTEIN: 6.8 g/dL (ref 6.5–8.1)
Total Bilirubin: 0.4 mg/dL (ref 0.3–1.2)

## 2015-10-12 LAB — I-STAT TROPONIN, ED: Troponin i, poc: 0 ng/mL (ref 0.00–0.08)

## 2015-10-12 MED ORDER — ACETAMINOPHEN 325 MG PO TABS: 650.0000 mg | ORAL_TABLET | Freq: Four times a day (QID) | ORAL | Status: DC | PRN

## 2015-10-12 MED ORDER — PREDNISONE 20 MG PO TABS
60.0000 mg | ORAL_TABLET | Freq: Once | ORAL | Status: AC
  Administered 2015-10-12: 60 mg via ORAL
  Filled 2015-10-12: qty 3

## 2015-10-12 MED ORDER — AZITHROMYCIN 500 MG PO TABS
500.0000 mg | ORAL_TABLET | Freq: Every day | ORAL | Status: DC
  Administered 2015-10-13 – 2015-10-15 (×3): 500 mg via ORAL
  Filled 2015-10-12 (×3): qty 1

## 2015-10-12 MED ORDER — MAGNESIUM SULFATE 2 GM/50ML IV SOLN
2.0000 g | Freq: Once | INTRAVENOUS | Status: AC
  Administered 2015-10-12: 2 g via INTRAVENOUS
  Filled 2015-10-12: qty 50

## 2015-10-12 MED ORDER — BUDESONIDE 0.25 MG/2ML IN SUSP
0.2500 mg | Freq: Two times a day (BID) | RESPIRATORY_TRACT | Status: DC
  Administered 2015-10-13 – 2015-10-15 (×5): 0.25 mg via RESPIRATORY_TRACT
  Filled 2015-10-12 (×5): qty 2

## 2015-10-12 MED ORDER — ALBUTEROL SULFATE (2.5 MG/3ML) 0.083% IN NEBU: 2.5000 mg | INHALATION_SOLUTION | Freq: Four times a day (QID) | RESPIRATORY_TRACT | Status: DC | PRN

## 2015-10-12 MED ORDER — SODIUM CHLORIDE 0.9 % IV SOLN: Freq: Once | INTRAVENOUS | Status: DC

## 2015-10-12 MED ORDER — METHYLPREDNISOLONE SODIUM SUCC 125 MG IJ SOLR
125.0000 mg | Freq: Once | INTRAMUSCULAR | Status: AC
  Administered 2015-10-12: 125 mg via INTRAVENOUS
  Filled 2015-10-12 (×2): qty 2

## 2015-10-12 MED ORDER — PREDNISONE 50 MG PO TABS
60.0000 mg | ORAL_TABLET | Freq: Every day | ORAL | Status: DC
  Administered 2015-10-13 – 2015-10-15 (×3): 60 mg via ORAL
  Filled 2015-10-12 (×3): qty 1

## 2015-10-12 MED ORDER — SODIUM CHLORIDE 0.9 % IV BOLUS (SEPSIS)
1000.0000 mL | Freq: Once | INTRAVENOUS | Status: AC
  Administered 2015-10-12: 1000 mL via INTRAVENOUS

## 2015-10-12 MED ORDER — ONDANSETRON HCL 4 MG/2ML IJ SOLN: 4.0000 mg | Freq: Three times a day (TID) | INTRAMUSCULAR | Status: DC | PRN

## 2015-10-12 MED ORDER — ALBUTEROL SULFATE (2.5 MG/3ML) 0.083% IN NEBU
5.0000 mg | INHALATION_SOLUTION | Freq: Once | RESPIRATORY_TRACT | Status: AC
  Administered 2015-10-12: 5 mg via RESPIRATORY_TRACT
  Filled 2015-10-12 (×2): qty 6

## 2015-10-12 MED ORDER — ONDANSETRON HCL 4 MG/2ML IJ SOLN
4.0000 mg | Freq: Four times a day (QID) | INTRAMUSCULAR | Status: DC | PRN
Start: 2015-10-12 — End: 2015-10-15
  Administered 2015-10-12: 4 mg via INTRAVENOUS
  Filled 2015-10-12: qty 2

## 2015-10-12 MED ORDER — SODIUM CHLORIDE 0.9 % IV SOLN
INTRAVENOUS | Status: DC
  Administered 2015-10-12 – 2015-10-13 (×2): via INTRAVENOUS

## 2015-10-12 MED ORDER — SODIUM CHLORIDE 0.9 % IV SOLN: INTRAVENOUS | Status: DC

## 2015-10-12 MED ORDER — ENSURE ENLIVE PO LIQD: 237.0000 mL | Freq: Two times a day (BID) | ORAL | Status: DC

## 2015-10-12 MED ORDER — ACETAMINOPHEN 650 MG RE SUPP: 650.0000 mg | Freq: Four times a day (QID) | RECTAL | Status: DC | PRN

## 2015-10-12 MED ORDER — IPRATROPIUM BROMIDE 0.02 % IN SOLN
0.5000 mg | Freq: Four times a day (QID) | RESPIRATORY_TRACT | Status: DC
  Administered 2015-10-13 – 2015-10-14 (×5): 0.5 mg via RESPIRATORY_TRACT
  Filled 2015-10-12 (×3): qty 2.5

## 2015-10-12 MED ORDER — LEVOTHYROXINE SODIUM 25 MCG PO TABS
125.0000 ug | ORAL_TABLET | Freq: Every day | ORAL | Status: DC
  Administered 2015-10-13 – 2015-10-15 (×3): 125 ug via ORAL
  Filled 2015-10-12 (×3): qty 1

## 2015-10-12 MED ORDER — ASPIRIN EC 81 MG PO TBEC
81.0000 mg | DELAYED_RELEASE_TABLET | Freq: Every day | ORAL | Status: DC
  Filled 2015-10-12 (×3): qty 1

## 2015-10-12 MED ORDER — SODIUM CHLORIDE 0.9% FLUSH
3.0000 mL | Freq: Two times a day (BID) | INTRAVENOUS | Status: DC
  Administered 2015-10-13 – 2015-10-15 (×6): 3 mL via INTRAVENOUS

## 2015-10-12 MED ORDER — ENOXAPARIN SODIUM 30 MG/0.3ML ~~LOC~~ SOLN
30.0000 mg | SUBCUTANEOUS | Status: DC
  Administered 2015-10-13 – 2015-10-14 (×2): 30 mg via SUBCUTANEOUS
  Filled 2015-10-12 (×2): qty 0.3

## 2015-10-12 MED ORDER — POTASSIUM CHLORIDE CRYS ER 20 MEQ PO TBCR
40.0000 meq | EXTENDED_RELEASE_TABLET | Freq: Two times a day (BID) | ORAL | Status: DC
  Administered 2015-10-13 – 2015-10-15 (×6): 40 meq via ORAL
  Filled 2015-10-12 (×6): qty 2

## 2015-10-12 MED ORDER — SODIUM BICARBONATE 650 MG PO TABS
650.0000 mg | ORAL_TABLET | Freq: Two times a day (BID) | ORAL | Status: DC
  Administered 2015-10-13 – 2015-10-14 (×4): 650 mg via ORAL
  Filled 2015-10-12 (×4): qty 1

## 2015-10-12 MED ORDER — ONDANSETRON HCL 4 MG PO TABS: 4.0000 mg | ORAL_TABLET | Freq: Four times a day (QID) | ORAL | Status: DC | PRN

## 2015-10-12 MED ORDER — HYDROCODONE-ACETAMINOPHEN 5-325 MG PO TABS
1.0000 | ORAL_TABLET | ORAL | Status: DC | PRN
  Administered 2015-10-12: 2 via ORAL
  Administered 2015-10-13 – 2015-10-14 (×4): 1 via ORAL
  Filled 2015-10-12: qty 2
  Filled 2015-10-12 (×4): qty 1

## 2015-10-12 MED ORDER — AZITHROMYCIN 250 MG PO TABS
500.0000 mg | ORAL_TABLET | Freq: Once | ORAL | Status: AC
  Administered 2015-10-12: 500 mg via ORAL
  Filled 2015-10-12: qty 2

## 2015-10-12 MED ORDER — POTASSIUM CHLORIDE CRYS ER 20 MEQ PO TBCR
40.0000 meq | EXTENDED_RELEASE_TABLET | Freq: Once | ORAL | Status: AC
  Administered 2015-10-12: 40 meq via ORAL
  Filled 2015-10-12: qty 2

## 2015-10-12 MED ORDER — CALCITRIOL 0.5 MCG PO CAPS
0.5000 ug | ORAL_CAPSULE | Freq: Every day | ORAL | Status: DC
  Administered 2015-10-13 – 2015-10-15 (×3): 0.5 ug via ORAL
  Filled 2015-10-12 (×3): qty 1

## 2015-10-12 NOTE — H&P (Signed)
Natasha [name]:[id number] DOB: Jan 30, 1946 DOA: 10/12/2015    PCP: Lottie Dawson, MD   Outpatient Specialists: Nephrology [facility], GI Earle Gell,    Patient coming from: home  Chief Complaint: cough  HPI: Natasha [name] is a 70 y.o. female with medical history significant of CKD cr 3.1, HTN, Hypokalemia, combined systolic diastolic CHF, Crohn's disease bilateral leg pain secondary to spinal stenosis old by neurology    Presented with 1 Week hx of increased cough productive of green sputum, runny nose, fever, diarrhea, generalize malaise and muscle aches. She had significant increase in wheezing,   Her Grand son has been ill. She tried to take some medication over the counter with no improvement, she used her inhalers with no results. Reports chest pain when she coughs.   Regarding pertinent Chronic problems: History of Crohn's disease on chronic Humira and prednisone Sp partial Collectomy followed by GI. She has known history of diastolic heart failure last echogram was in 2015 showed EF of 77% and diastolic dysfunction. Normal story of chronic kidney disease followed by nephrology history of metabolic acidosis and hypokalemia on chronic replacement   IN ER: a.febrile   Hospitalist was called for admission for COPD exacerbation and hypokalmemia  Review of Systems:    Pertinent positives include: Fevers, chills, fatigue,   Constitutional:  No weight loss, night sweats, weight loss  HEENT:  No headaches, Difficulty swallowing,Tooth/dental problems,Sore throat,  No sneezing, itching, ear ache, nasal congestion, post nasal drip,  Cardio-vascular:  No chest pain, Orthopnea, PND, anasarca, dizziness, palpitations.no Bilateral lower extremity swelling  GI:  No heartburn, indigestion, abdominal pain, nausea, vomiting, diarrhea, change in bowel habits, loss of appetite, melena, blood in stool, hematemesis Resp:  no shortness of breath at rest. No dyspnea on  exertion, No excess mucus, no productive cough, No non-productive cough, No coughing up of blood.No change in color of mucus.No wheezing. Skin:  no rash or lesions. No jaundice GU:  no dysuria, change in color of urine, no urgency or frequency. No straining to urinate.  No flank pain.  Musculoskeletal:  No joint pain or no joint swelling. No decreased range of motion. No back pain.  Psych:  No change in mood or affect. No depression or anxiety. No memory loss.  Neuro: no localizing neurological complaints, no tingling, no weakness, no double vision, no gait abnormality, no slurred speech, no confusion  As per HPI otherwise 10 point review of systems negative.   Past Medical History: Past Medical History  Diagnosis Date  . GERD (gastroesophageal reflux disease)   . Arthritis   . Osteoporosis   . Bulging disc     L3-L4  . Emphysema/COPD ([place])   . History of small bowel obstruction     12-03-2010  due to crohn's ileitis  . History of pancreatitis     2008--  mercaptopurine  . Hypothyroidism, postsurgical     multinodule w/ hurthle cells  . Crohn's disease ([place])     chronic ileitis  . Perianal Crohn's disease ([place])   . Anal fistula   . Polyarthralgia   . Borderline hypertension   . Chronic diarrhea     due to crohn's  . History of glaucoma   . Wears partial dentures     upper  . CKD (chronic kidney disease), stage IV ([place])     NEPHROLOGIST--  DR MATTINGLY  . Nephrolithiasis     bilateral  . Anemia in chronic kidney disease (CKD)    Past  Surgical History  Procedure Laterality Date  . Colon resection  x3 --  1978,  1987, 1989    ILEAL RESECTION x2/   RIGHT COLECTOMY 1989  . Esophagogastroduodenoscopy  03/04/2012    Procedure: ESOPHAGOGASTRODUODENOSCOPY (EGD);  Surgeon: Garlan Fair, MD;  Location: [facility];  Service: Endoscopy;  Laterality: N/A;  . Flexible sigmoidoscopy  03/04/2012    Procedure: FLEXIBLE SIGMOIDOSCOPY;  Surgeon: Garlan Fair, MD;  Location:  [facility];  Service: Endoscopy;  Laterality: N/A;  . Examination under anesthesia N/A 09/12/2014    Procedure: EXAM UNDER ANESTHESIA;  Surgeon: Rolm Bookbinder, MD;  Location: [facility];  Service: General;  Laterality: N/A;  . Incision and drainage perirectal abscess N/A 09/12/2014    Procedure: IRRIGATION AND DEBRIDEMENT PERIRECTAL ABSCESS;  Surgeon: Rolm Bookbinder, MD;  Location: [facility];  Service: General;  Laterality: N/A;  . Colonoscopy with propofol N/A 09/25/2014    Procedure: COLONOSCOPY WITH PROPOFOL;  Surgeon: Garlan Fair, MD;  Location: [facility];  Service: Endoscopy;  Laterality: N/A;  . Total thyroidectomy  10-13-2003  . Transthoracic echocardiogram  07-11-2013    mild LVH,  ef 55%,  moderate AR,  mild MR and TR,  trivial PR  . Glaucoma surgery Bilateral   . Negative sleep study  2008  . Abdominal hysterectomy  1990    and  Appendectomy  . Rectal exam under anesthesia N/A 12/01/2014    Procedure: RECTAL EXAM UNDER ANESTHESIA;  Surgeon: [name], MD;  Location: [facility];  Service: General;  Laterality: N/A;  . Placement of seton N/A 12/01/2014    Procedure: PLACEMENT OF SETON;  Surgeon: [name], MD;  Location: [place];  Service: General;  Laterality: N/A;     Social History:  Ambulatory   independently   Lives at home   With family     reports that she quit smoking about 13 years ago. Her smoking use included Cigarettes. She has a 3.75 pack-year smoking history. She has never used smokeless tobacco. She reports that she does not drink alcohol or use illicit drugs.  Allergies:   Allergies  Allergen Reactions  . Mercaptopurine Other (See Comments)    Caused pancreatitis  . Remicade [Infliximab] Other (See Comments)    "joint pain"       Family History:    Family History  Problem Relation Age of Onset  . Hypertension Father   . Heart disease Father   . Heart attack Father   . Diverticulitis Mother   . COPD  Mother   . Hypertension Mother   . Heart disease Mother   . Heart attack Mother   . Colon cancer Brother   . Diabetes Brother   . Thyroid disease Brother     Medications: Prior to Admission medications   Medication Sig Start Date End Date Taking? Authorizing Provider  acetaminophen (TYLENOL) 500 MG tablet Take 500 mg by mouth every 6 (six) hours as needed for mild pain.    Yes Historical Provider, MD  Adalimumab (HUMIRA) 40 MG/0.8ML PSKT Inject 40 mg into the skin every 14 (fourteen) days.   Yes Historical Provider, MD  calcitRIOL (ROCALTROL) 0.5 MCG capsule Take 0.5 mcg by mouth daily.   Yes Historical Provider, MD  Cholecalciferol (VITAMIN D3) 2000 UNITS TABS Take 2,000 Units by mouth daily.  04/07/14  Yes Historical Provider, MD  cyanocobalamin (,VITAMIN B-12,) 1000 MCG/ML injection Inject 1,000 mcg into the muscle every 30 (thirty) days. Last injection was 04/28/12  Yes Historical Provider, MD  folic acid (FOLVITE) 1 MG tablet Take 1 mg by mouth daily.   Yes Historical Provider, MD  levothyroxine (SYNTHROID, LEVOTHROID) 125 MCG tablet Take 125 mcg by mouth every morning.    Yes Historical Provider, MD  potassium chloride SA (K-DUR,KLOR-CON) 20 MEQ tablet Take 40 mEq by mouth 2 (two) times daily. 09/24/15  Yes Historical Provider, MD  predniSONE (DELTASONE) 5 MG tablet Take 1 tablet (5 mg total) by mouth daily with breakfast. 09/24/14  Yes Geradine Girt, DO  promethazine (PHENERGAN) 25 MG tablet Take 25 mg by mouth every 6 (six) hours as needed for nausea or vomiting.  04/19/14  Yes Historical Provider, MD  QVAR 80 MCG/ACT inhaler Inhale 2 puffs into the lungs as needed (for wheezing).  08/27/13  Yes Historical Provider, MD  sodium bicarbonate 650 MG tablet Take 1 tablet (650 mg total) by mouth 2 (two) times daily. 04/07/14  Yes Albertine Patricia, MD  SPIRIVA HANDIHALER 18 MCG inhalation capsule Place 18 mcg into inhaler and inhale as needed (for COPD).  09/07/13  Yes Historical Provider, MD    albuterol (PROVENTIL HFA;VENTOLIN HFA) 108 (90 BASE) MCG/ACT inhaler Inhale 2 puffs into the lungs every 6 (six) hours as needed for wheezing or shortness of breath.    Historical Provider, MD    Physical Exam: Patient Vitals for the past 24 hrs:  BP Temp Temp src Pulse Resp SpO2  10/12/15 2130 110/72 mmHg - - 92 15 100 %  10/12/15 2100 113/76 mmHg - - 95 20 100 %  10/12/15 2045 119/70 mmHg - - 87 23 100 %  10/12/15 1852 110/77 mmHg 99.3 F (37.4 C) Oral 104 16 100 %    1. General:  in No Acute distress 2. Psychological: Alert and  Oriented 3. Head/ENT:   Dry Mucous Membranes                          Head Non traumatic, neck supple                           Poor Dentition 4. SKIN:   decreased Skin turgor,  Skin clean Dry and intact no rash 5. Heart: Regular rate and rhythm no Murmur, Rub or gallop 6. Lungs: occasional wheezes no crackles   7. Abdomen: Soft, non-tender, Non distended 8. Lower extremities: no clubbing, cyanosis, or edema 9. Neurologically Grossly intact, moving all 4 extremities equally 10. MSK: Normal range of motion   body mass index is unknown because there is no weight on file.  Labs on Admission:   Labs on Admission: I have personally reviewed following labs and imaging studies  CBC:  Recent Labs Lab 10/12/15 1854  WBC 8.7  HGB 12.1  HCT 38.1  MCV 97.9  PLT 620*   Basic Metabolic Panel:  Recent Labs Lab 10/12/15 1854  NA 139  K 2.6*  CL 113*  CO2 15*  GLUCOSE 109*  BUN 37*  CREATININE 3.58*  CALCIUM 8.2*   GFR: CrCl cannot be calculated (Unknown ideal weight.). Liver Function Tests:  Recent Labs Lab 10/12/15 1854  AST 15  ALT 15  ALKPHOS 148*  BILITOT 0.4  PROT 6.8  ALBUMIN 3.1*   No results for input(s): LIPASE, AMYLASE in the last 168 hours. No results for input(s): AMMONIA in the last 168 hours. Coagulation Profile: No results for input(s): INR, PROTIME in the last 168  hours. Cardiac Enzymes: No results for  input(s): CKTOTAL, CKMB, CKMBINDEX, TROPONINI in the last 168 hours. BNP (last 3 results) No results for input(s): PROBNP in the last 8760 hours. HbA1C: No results for input(s): HGBA1C in the last 72 hours. CBG: No results for input(s): GLUCAP in the last 168 hours. Lipid Profile: No results for input(s): CHOL, HDL, LDLCALC, TRIG, CHOLHDL, LDLDIRECT in the last 72 hours. Thyroid Function Tests: No results for input(s): TSH, T4TOTAL, FREET4, T3FREE, THYROIDAB in the last 72 hours. Anemia Panel: No results for input(s): VITAMINB12, FOLATE, FERRITIN, TIBC, IRON, RETICCTPCT in the last 72 hours. Urine analysis:    Component Value Date/Time   COLORURINE YELLOW 09/09/2014 0850   APPEARANCEUR CLOUDY* 09/09/2014 0850   LABSPEC 1.010 09/09/2014 0850   PHURINE 5.5 09/09/2014 0850   GLUCOSEU NEGATIVE 09/09/2014 0850   HGBUR TRACE* 09/09/2014 0850   [place] 09/09/2014 0850   KETONESUR NEGATIVE 09/09/2014 0850   PROTEINUR 30* 09/09/2014 0850   UROBILINOGEN 0.2 09/09/2014 0850   NITRITE NEGATIVE 09/09/2014 0850   LEUKOCYTESUR NEGATIVE 09/09/2014 0850   Sepsis Labs: @LABRCNTIP (procalcitonin:4,lacticidven:4) )No results found for this or any previous visit (from the past 240 hour(s)).       UA ordered  No results found for: HGBA1C  CrCl cannot be calculated (Unknown ideal weight.).  BNP (last 3 results) No results for input(s): PROBNP in the last 8760 hours.   ECG REPORT  Independently reviewed Rate:107  Rhythm: sinus tachycardia ST&T Change: No acute ischemic changes   QTC 426  There were no vitals filed for this visit.   Cultures:    Component Value Date/Time   SDES ABSCESS 09/12/2014 1028   SDES ABSCESS 09/12/2014 1028   SPECREQUEST PERIRECTAL PT ON ZOSYN 09/12/2014 1028   SPECREQUEST PERIRECTAL PT ON ZOSYN 09/12/2014 1028   CULT  09/12/2014 1028    NO ANAEROBES ISOLATED Performed at [facility]  09/12/2014 1028    RARE YEAST  CONSISTENT WITH CANDIDA SPECIES Performed at [facility] 09/17/2014 FINAL 09/12/2014 1028   REPTSTATUS 09/14/2014 FINAL 09/12/2014 1028     Radiological Exams on Admission: Dg Chest 2 View  10/12/2015  CLINICAL DATA:  Productive cough. EXAM: CHEST  2 VIEW COMPARISON:  Oct 16, 2014 FINDINGS: No pneumothorax. EKG leads overlie the lung apices. The heart, hila, and mediastinum are unchanged. No pulmonary nodules or masses are identified. No focal infiltrates are seen to explain the patient's symptoms. IMPRESSION: No active cardiopulmonary disease. Electronically Signed   By: Dorise Bullion III M.D   On: 10/12/2015 19:52    Chart has been reviewed    Assessment/Plan  70 y.o. female with medical history significant of CKD cr 3.1, HTN, Hypokalemia, combined systolic diastolic CHF, Crohn's disease bilateral leg pain secondary to spinal stenosis here with COPD exacerbation and hypokalemia   Present on Admission:  . COPD exacerbation ([place]) -  - Will initiate Steroid taper, antibiotics, Albuterol PRN, scheduled Atrovent  and Mucinex. Titrate O2 to saturation >90%. Follow patients respiratory status.  . Hypokalemia - will replace followed carefully given renal insufficiency to avoid over replacement . Acute on chronic renal failure (HCC) is secondary to mild dehydration will rehydrate and recheck  . Chronic combined systolic and diastolic CHF (congestive heart failure) (HCC) currently appears to be somewhat fluid down will give gentle fluids hold Lasix for today  . Crohn's disease s/p Colectomy, Terminal ilectomies.  Managed by [facility] - hold Humira patient  will be on steroids for COPD  . CKD (chronic kidney disease) stage 4, GFR 15-29 ml/min (HCC) somewhat above baseline probably not very significant but patient may have some degree of acute on chronic renal injury secondary to dehydration  . Essential hypertension stable continue home medications  . Metabolic acidosis  continue home bicarbonate  Chest pain most likely secondary to coughing.  - Given risk factors will monitor on telemetry cycle cardiac enzymes    Other plan as per orders.  DVT prophylaxis:   Lovenox     Code Status:  FULL CODE  as per patient    Family Communication:   Family not at  Bedside    Disposition Plan:    To home once workup is complete and patient is stable   Consults called: none    Admission status:  inpatient      Level of care    tele      I have spent a total of 56 min on this admission      Emireth Cockerham 10/12/2015, 11:22 PM    [facility]  Pager [phone]   after 2 AM please page floor coverage PA If 7AM-7PM, please contact the day team taking care of the patient  [email or web address]  Password [email or web address]

## 2015-10-12 NOTE — ED Notes (Signed)
Pt here with c/o productive cough of yellowish-green sputum, SOB, weakness and chest congestion, onset earlier this week.

## 2015-10-12 NOTE — ED Provider Notes (Signed)
CSN: [id number]     Arrival date & time 10/12/15  1844 History   First MD Initiated Contact with Patient 10/12/15 2020     Chief Complaint  Patient presents with  . Cough     (Consider location/radiation/quality/duration/timing/severity/associated sxs/prior Treatment) Patient is a 70 y.o. female presenting with cough.  Cough Cough characteristics:  Productive Sputum characteristics:  Green and yellow Severity:  Moderate Onset quality:  Gradual Duration:  1 week Timing:  Constant Progression:  Worsening Chronicity:  New Smoker: no   Context: upper respiratory infection   Context: not sick contacts   Relieved by:  Nothing Worsened by:  Deep breathing and activity Ineffective treatments:  Beta-agonist inhaler and rest Associated symptoms: chest pain, chills, fever (subjective), rhinorrhea, shortness of breath, sinus congestion and wheezing   Associated symptoms: no headaches and no rash   Risk factors comment:  PMHx COPD   Past Medical History  Diagnosis Date  . GERD (gastroesophageal reflux disease)   . Arthritis   . Osteoporosis   . Bulging disc     L3-L4  . Emphysema/COPD ([place])   . History of small bowel obstruction     12-03-2010  due to crohn's ileitis  . History of pancreatitis     2008--  mercaptopurine  . Hypothyroidism, postsurgical     multinodule w/ hurthle cells  . Crohn's disease ([place])     chronic ileitis  . Perianal Crohn's disease ([place])   . Anal fistula   . Polyarthralgia   . Borderline hypertension   . Chronic diarrhea     due to crohn's  . History of glaucoma   . Wears partial dentures     upper  . CKD (chronic kidney disease), stage IV ([place])     NEPHROLOGIST--  DR MATTINGLY  . Nephrolithiasis     bilateral  . Anemia in chronic kidney disease (CKD)    Past Surgical History  Procedure Laterality Date  . Colon resection  x3 --  1978,  1987, 1989    ILEAL RESECTION x2/   RIGHT COLECTOMY 1989  . Esophagogastroduodenoscopy  03/04/2012   Procedure: ESOPHAGOGASTRODUODENOSCOPY (EGD);  Surgeon: Garlan Fair, MD;  Location: [facility];  Service: Endoscopy;  Laterality: N/A;  . Flexible sigmoidoscopy  03/04/2012    Procedure: FLEXIBLE SIGMOIDOSCOPY;  Surgeon: Garlan Fair, MD;  Location: [facility];  Service: Endoscopy;  Laterality: N/A;  . Examination under anesthesia N/A 09/12/2014    Procedure: EXAM UNDER ANESTHESIA;  Surgeon: Rolm Bookbinder, MD;  Location: [facility];  Service: General;  Laterality: N/A;  . Incision and drainage perirectal abscess N/A 09/12/2014    Procedure: IRRIGATION AND DEBRIDEMENT PERIRECTAL ABSCESS;  Surgeon: Rolm Bookbinder, MD;  Location: [facility];  Service: General;  Laterality: N/A;  . Colonoscopy with propofol N/A 09/25/2014    Procedure: COLONOSCOPY WITH PROPOFOL;  Surgeon: Garlan Fair, MD;  Location: [facility];  Service: Endoscopy;  Laterality: N/A;  . Total thyroidectomy  10-13-2003  . Transthoracic echocardiogram  07-11-2013    mild LVH,  ef 55%,  moderate AR,  mild MR and TR,  trivial PR  . Glaucoma surgery Bilateral   . Negative sleep study  2008  . Abdominal hysterectomy  1990    and  Appendectomy  . Rectal exam under anesthesia N/A 12/01/2014    Procedure: RECTAL EXAM UNDER ANESTHESIA;  Surgeon: [name], MD;  Location: [facility];  Service: General;  Laterality: N/A;  . Placement of seton N/A 12/01/2014  Procedure: PLACEMENT OF SETON;  Surgeon: [name], MD;  Location: [facility];  Service: General;  Laterality: N/A;   Family History  Problem Relation Age of Onset  . Hypertension Father   . Heart disease Father   . Heart attack Father   . Diverticulitis Mother   . COPD Mother   . Hypertension Mother   . Heart disease Mother   . Heart attack Mother   . Colon cancer Brother   . Diabetes Brother   . Thyroid disease Brother    Social History  Substance Use Topics  . Smoking status: Former Smoker -- 0.25 packs/day for 15 years     Types: Cigarettes    Quit date: 11/27/2001  . Smokeless tobacco: Never Used  . Alcohol Use: No   OB History    No data available     Review of Systems  Constitutional: Positive for fever (subjective), chills and fatigue.  HENT: Positive for congestion and rhinorrhea.   Eyes: Negative for visual disturbance.  Respiratory: Positive for cough, chest tightness, shortness of breath and wheezing.   Cardiovascular: Positive for chest pain.  Gastrointestinal: Positive for nausea and diarrhea ("soft stools"). Negative for vomiting, abdominal pain and constipation.  Genitourinary: Negative for dysuria and difficulty urinating.  Musculoskeletal: Negative for back pain and neck pain.  Skin: Negative for pallor and rash.  Neurological: Negative for dizziness and headaches.  Psychiatric/Behavioral: Negative for confusion.      Allergies  Mercaptopurine and Remicade  Home Medications   Prior to Admission medications   Medication Sig Start Date End Date Taking? Authorizing Provider  acetaminophen (TYLENOL) 500 MG tablet Take 500 mg by mouth every 6 (six) hours as needed for mild pain.     Historical Provider, MD  albuterol (PROVENTIL HFA;VENTOLIN HFA) 108 (90 BASE) MCG/ACT inhaler Inhale 2 puffs into the lungs every 6 (six) hours as needed for wheezing or shortness of breath.    Historical Provider, MD  calcitRIOL (ROCALTROL) 0.5 MCG capsule Take 0.5 mcg by mouth daily.    Historical Provider, MD  Cholecalciferol (VITAMIN D3) 2000 UNITS TABS Take 1 capsule by mouth daily.  04/07/14   Historical Provider, MD  cyanocobalamin (,VITAMIN B-12,) 1000 MCG/ML injection Inject 1,000 mcg into the muscle every 30 (thirty) days. Last injection was 04/28/12    Historical Provider, MD  cyclobenzaprine (FLEXERIL) 10 MG tablet Take 10 mg by mouth as needed for muscle spasms.     Historical Provider, MD  folic acid (FOLVITE) 1 MG tablet Take 1 mg by mouth daily.    Historical Provider, MD  levothyroxine  (SYNTHROID, LEVOTHROID) 125 MCG tablet Take 125 mcg by mouth every morning.     Historical Provider, MD  oxyCODONE (OXY IR/ROXICODONE) 5 MG immediate release tablet Take 1-2 tablets (5-10 mg total) by mouth every 4 (four) hours as needed. 12/28/44   [name], MD  predniSONE (DELTASONE) 5 MG tablet Take 1 tablet (5 mg total) by mouth daily with breakfast. 09/24/14   Geradine Girt, DO  promethazine (PHENERGAN) 25 MG tablet Take 25 mg by mouth every 6 (six) hours as needed for nausea or vomiting.  04/19/14   Historical Provider, MD  QVAR 80 MCG/ACT inhaler Inhale 2 puffs into the lungs as needed (for wheezing).  08/27/13   Historical Provider, MD  sodium bicarbonate 650 MG tablet Take 1 tablet (650 mg total) by mouth 2 (two) times daily. 04/07/14   Albertine Patricia, MD  SPIRIVA HANDIHALER 18 MCG inhalation  capsule Place 18 mcg into inhaler and inhale as needed (for COPD).  09/07/13   Historical Provider, MD   BP 110/77 mmHg  Pulse 104  Temp(Src) 99.3 F (37.4 C) (Oral)  Resp 16  SpO2 100% Physical Exam  Constitutional: She is oriented to person, place, and time. She appears well-developed and well-nourished. No distress.  HENT:  Head: Normocephalic and atraumatic.  Mouth/Throat: Mucous membranes are dry. No oropharyngeal exudate.  Skin below left nare red and irritated  Eyes: EOM are normal. Pupils are equal, round, and reactive to light.  Neck: Normal range of motion. Neck supple.  Cardiovascular: Regular rhythm and intact distal pulses.  Tachycardia present.   Pulmonary/Chest: Effort normal. No respiratory distress. She has wheezes (scattered expiratory wheezes).  Abdominal: Soft. She exhibits no distension. There is no tenderness.  Musculoskeletal: Normal range of motion. She exhibits no edema or tenderness.  Neurological: She is alert and oriented to person, place, and time.  Skin: Skin is warm and dry. No rash noted.  Psychiatric: She has a normal mood and affect.  Nursing note and  vitals reviewed.   ED Course  Procedures (including critical care time) Labs Review Labs Reviewed  CBC - Abnormal; Notable for the following:    Platelets 145 (*)    All other components within normal limits  COMPREHENSIVE METABOLIC PANEL - Abnormal; Notable for the following:    Potassium 2.6 (*)    Chloride 113 (*)    CO2 15 (*)    Glucose, Bld 109 (*)    BUN 37 (*)    Creatinine, Ser 3.58 (*)    Calcium 8.2 (*)    Albumin 3.1 (*)    Alkaline Phosphatase 148 (*)    GFR calc non Af Amer 12 (*)    GFR calc Af Amer 14 (*)    All other components within normal limits  RESPIRATORY PANEL BY PCR  URINALYSIS, ROUTINE W REFLEX MICROSCOPIC (NOT AT [facility])  MAGNESIUM  PHOSPHORUS  TSH  COMPREHENSIVE METABOLIC PANEL  CBC  TROPONIN I  TROPONIN I  TROPONIN I  Randolm Idol, ED    Imaging Review Dg Chest 2 View  10/12/2015  CLINICAL DATA:  Productive cough. EXAM: CHEST  2 VIEW COMPARISON:  Oct 16, 2014 FINDINGS: No pneumothorax. EKG leads overlie the lung apices. The heart, hila, and mediastinum are unchanged. No pulmonary nodules or masses are identified. No focal infiltrates are seen to explain the patient's symptoms. IMPRESSION: No active cardiopulmonary disease. Electronically Signed   By: Dorise Bullion III M.D   On: 10/12/2015 19:52   I have personally reviewed and evaluated these images and lab results as part of my medical decision-making.   EKG Interpretation   Date/Time:  Friday Oct 12 2015 18:53:01 EDT Ventricular Rate:  107 PR Interval:  120 QRS Duration: 76 QT Interval:  342 QTC Calculation: 456 R Axis:   23 Text Interpretation:  Sinus tachycardia Biatrial enlargement Anterior  infarct , age undetermined Abnormal ECG Since last tracing st  abnormalities are now present and rate is faster. Confirmed by Sabra Heck  MD,  [place] ([id number]) on 10/12/2015 8:19:41 PM      MDM   Final diagnoses:  Hypokalemia  Dehydration    Patient is a 70 year old female with past  medical history of COPD who presents with a week of progressive cough, sputum production and shortness of breath, concerning for COPD exacerbation. On presentation patient is normotensive, maintaining O2 sats 100% on room air. She is tachycardic and  appears mildly dehydrated on exam. Initial EKG with wandering baseline but possible new ST changes and tachycardia. Patient also appears to prolonged QT. Labs significant for hypokalemia and increased BUN and creatinine compared with baseline. Patient has a metabolic acidosis without a gap, likely attributable to her chronic kidney disease. CXR does not show signs of infection.   IV fluids, potassium and magnesium replacement given. As patient has cough with new sputum production, without signs of pneumonia on CXR, treated w/ prednisone and azithromycin for presumed COPD exacerbation. Hospitalist was consulted for admission in triage the patient. Will be admitted for further monitoring of her electrolytes, IV hydration and treatment of COPD.  Patient seen and discussed with Dr. Sabra Heck, ED attending.   Gibson Ramp, MD 10/13/15 3200  Noemi Chapel, MD 10/14/15 1452

## 2015-10-12 NOTE — ED Provider Notes (Signed)
The patient is a 70 year old female, she has a known history of COPD as well as renal insufficiency and Crohn's disease. Over the week she has had a progressive cough which is productive of phlegm as well as some generalized weakness and lack of appetite. She has had very little to eat or drink and is now feeling very weak. On exam the patient is tachycardic, she has a soft nontender abdomen, she is mildly tachypneic and has mild expiratory wheezing but speaks in full sentences. She has an x-ray which shows hyperexpanded lungs consistent with COPD but no obvious infiltrate. There is no leukocytosis. The patient does have hypokalemia and an abnormal EKG compared to prior EKGs. Due to the big picture of the patient's generalized weakness, increased coughing, hypokalemia, decreased appetite and dehydrated status I would suggest that she needs to be admitted to the hospital for further evaluation and treatment of all of these conditions. We will start treatment with magnesium, potassium, fluids and antibiotics along with COPD exacerbation medications.  I saw and evaluated the patient, reviewed the resident's note and I agree with the findings and plan.   EKG Interpretation  Date/Time:  Friday Oct 12 2015 21:24:43 EDT Ventricular Rate:  91 PR Interval:  132 QRS Duration: 82 QT Interval:  367 QTC Calculation: 451 R Axis:   16 Text Interpretation:  Sinus rhythm Multiple ventricular premature complexes Probable left atrial enlargement Consider anterior infarct Since last tracing ST and T wave abnormalities have improved Confirmed by Sabra Heck  MD, [place] Riecke ([id number]) on 10/12/2015 9:31:20 PM Also confirmed by Sabra Heck  MD, [place] ([id number]), editor Rolla Plate, Joelene Millin [phone])  on 10/13/2015 8:59:20 AM       I personally interpreted the EKG as well as the resident and agree with the interpretation on the resident's chart.  Final diagnoses:  Hypokalemia  Dehydration      Noemi Chapel, MD 10/14/15 1453

## 2015-10-13 ENCOUNTER — Other Ambulatory Visit: Payer: Self-pay

## 2015-10-13 DIAGNOSIS — N179 Acute kidney failure, unspecified: Secondary | ICD-10-CM

## 2015-10-13 DIAGNOSIS — E872 Acidosis: Secondary | ICD-10-CM

## 2015-10-13 DIAGNOSIS — N189 Chronic kidney disease, unspecified: Secondary | ICD-10-CM

## 2015-10-13 DIAGNOSIS — I5042 Chronic combined systolic (congestive) and diastolic (congestive) heart failure: Secondary | ICD-10-CM

## 2015-10-13 DIAGNOSIS — J441 Chronic obstructive pulmonary disease with (acute) exacerbation: Principal | ICD-10-CM

## 2015-10-13 DIAGNOSIS — N184 Chronic kidney disease, stage 4 (severe): Secondary | ICD-10-CM

## 2015-10-13 LAB — COMPREHENSIVE METABOLIC PANEL
ALBUMIN: 2.6 g/dL — AB (ref 3.5–5.0)
ALT: 14 U/L (ref 14–54)
AST: 13 U/L — AB (ref 15–41)
Alkaline Phosphatase: 125 U/L (ref 38–126)
Anion gap: 14 (ref 5–15)
BILIRUBIN TOTAL: 0.4 mg/dL (ref 0.3–1.2)
BUN: 36 mg/dL — AB (ref 6–20)
CALCIUM: 7.9 mg/dL — AB (ref 8.9–10.3)
CO2: 12 mmol/L — ABNORMAL LOW (ref 22–32)
Chloride: 116 mmol/L — ABNORMAL HIGH (ref 101–111)
Creatinine, Ser: 2.98 mg/dL — ABNORMAL HIGH (ref 0.44–1.00)
GFR calc Af Amer: 17 mL/min — ABNORMAL LOW (ref >60)
GFR calc non Af Amer: 15 mL/min — ABNORMAL LOW (ref >60)
GLUCOSE: 112 mg/dL — AB (ref 65–99)
Potassium: 3.6 mmol/L (ref 3.5–5.1)
Sodium: 142 mmol/L (ref 135–145)
TOTAL PROTEIN: 6.1 g/dL — AB (ref 6.5–8.1)

## 2015-10-13 LAB — TROPONIN I: Troponin I: <0.03 ng/mL (ref <0.031)

## 2015-10-13 LAB — RESPIRATORY PANEL BY PCR
ADENOVIRUS-RVPPCR: NOT DETECTED
BORDETELLA PERTUSSIS-RVPCR: NOT DETECTED
Chlamydophila pneumoniae: NOT DETECTED
Coronavirus 229E: NOT DETECTED
Coronavirus HKU1: NOT DETECTED
Coronavirus NL63: NOT DETECTED
Coronavirus OC43: NOT DETECTED
INFLUENZA A H1 2009-RVPPR: NOT DETECTED
INFLUENZA A H1-RVPPCR: NOT DETECTED
Influenza A H3: NOT DETECTED
Influenza A: NOT DETECTED
Influenza B: NOT DETECTED
Metapneumovirus: NOT DETECTED
Mycoplasma pneumoniae: NOT DETECTED
PARAINFLUENZA VIRUS 1-RVPPCR: NOT DETECTED
PARAINFLUENZA VIRUS 2-RVPPCR: NOT DETECTED
PARAINFLUENZA VIRUS 4-RVPPCR: NOT DETECTED
Parainfluenza Virus 3: NOT DETECTED
RESPIRATORY SYNCYTIAL VIRUS-RVPPCR: NOT DETECTED
Rhinovirus / Enterovirus: NOT DETECTED

## 2015-10-13 LAB — URINE MICROSCOPIC-ADD ON: WBC, UA: NONE SEEN WBC/hpf (ref 0–5)

## 2015-10-13 LAB — CBC
HCT: 36.8 % (ref 36.0–46.0)
Hemoglobin: 11.4 g/dL — ABNORMAL LOW (ref 12.0–15.0)
MCH: 30.6 pg (ref 26.0–34.0)
MCHC: 31 g/dL (ref 30.0–36.0)
MCV: 98.7 fL (ref 78.0–100.0)
Platelets: 131 10*3/uL — ABNORMAL LOW (ref 150–400)
RBC: 3.73 MIL/uL — ABNORMAL LOW (ref 3.87–5.11)
RDW: 15.2 % (ref 11.5–15.5)
WBC: 7.5 10*3/uL (ref 4.0–10.5)

## 2015-10-13 LAB — URINALYSIS, ROUTINE W REFLEX MICROSCOPIC
BILIRUBIN URINE: NEGATIVE
GLUCOSE, UA: NEGATIVE mg/dL
Ketones, ur: NEGATIVE mg/dL
Leukocytes, UA: NEGATIVE
Nitrite: NEGATIVE
PROTEIN: 30 mg/dL — AB
Specific Gravity, Urine: 1.015 (ref 1.005–1.030)
pH: 6 (ref 5.0–8.0)

## 2015-10-13 LAB — TSH: TSH: 0.706 u[IU]/mL (ref 0.350–4.500)

## 2015-10-13 LAB — PHOSPHORUS: Phosphorus: 4 mg/dL (ref 2.5–4.6)

## 2015-10-13 LAB — MAGNESIUM: Magnesium: 2.2 mg/dL (ref 1.7–2.4)

## 2015-10-13 MED ORDER — BOOST / RESOURCE BREEZE PO LIQD
1.0000 | Freq: Two times a day (BID) | ORAL | Status: DC
  Administered 2015-10-15: 1 via ORAL

## 2015-10-13 MED ORDER — GUAIFENESIN-DM 100-10 MG/5ML PO SYRP
5.0000 mL | ORAL_SOLUTION | ORAL | Status: DC | PRN
  Administered 2015-10-13: 5 mL via ORAL
  Filled 2015-10-13: qty 5

## 2015-10-13 NOTE — Progress Notes (Signed)
PROGRESS NOTE  Natasha [name] SWN:[id number] DOB: 07-May-1946 DOA: 10/12/2015 PCP: Lottie Dawson, MD   LOS: 1 day   Brief Narrative: 70 y.o. female with medical history significant of CKD cr 3.1, HTN, Hypokalemia, presented with 1 Week hx of increased cough productive of green sputum, runny nose, fever, diarrhea, generalize malaise and muscle aches, wheezing, admitted for COPD exacerbation  Assessment & Plan: Active Problems:   Crohn's disease s/p Colectomy, Terminal ilectomies.  Managed by [facility]   Metabolic acidosis   Hypokalemia   CKD (chronic kidney disease) stage 4, GFR 15-29 ml/min (HCC)   Chronic combined systolic and diastolic CHF (congestive heart failure) (HCC)   Essential hypertension   Acute on chronic renal failure (HCC)   COPD exacerbation ([facility])   COPD exacerbation - wheezing improved this morning, change IV solu-medrol to prednisone - continue nebulizers - continue Azithromycin - given sick contacts respiratory panel sent, pending   Hypokalemia  - replace and monitor. Careful given renal failure  AKI on CKD IV - mild improvement with hydration  Chronic combined systolic and diastolic CHF (congestive heart failure) (HCC) - fluid down on admission, now euvolemic, allow diet and stop IVF to avoid overload   Crohn's disease  - s/p Colectomy, Terminal ilectomies. - Managed by [facility] - hold Humira patient will be on steroids for COPD   Essential hypertension  - stable continue home medications   Metabolic acidosis  - continue home bicarbonate   Chest pain - most likely secondary to coughing.  - Given risk factors will monitor on telemetry - cardiac enzymes negative x 3. Resolved   DVT prophylaxis: Lovenox Code Status: Full Family Communication: no family bedside Disposition Plan: home when ready, 1-2 days  Consultants:   None   Procedures:   None   Antimicrobials:  Azithromycin 5/19 >>   Subjective: - feeling better  this morning, less short of breath, no chest pain / palpitations   Objective: Filed Vitals:   10/12/15 2240 10/13/15 0500 10/13/15 0700 10/13/15 1151  BP: 125/64  106/70 116/65  Pulse: 78   90  Temp: 98.6 F (37 C)  98.5 F (36.9 C) 97.9 F (36.6 C)  TempSrc: Oral  Oral Oral  Resp: 18  18   Height: 5' 4"  (1.626 m)     Weight: 48.263 kg (106 lb 6.4 oz) 48.2 kg (106 lb 4.2 oz)    SpO2: 100%  100% 99%    Intake/Output Summary (Last 24 hours) at 10/13/15 1250 Last data filed at 10/13/15 0851  Gross per 24 hour  Intake  997.5 ml  Output   1100 ml  Net -102.5 ml   Filed Weights   10/12/15 2240 10/13/15 0500  Weight: 48.263 kg (106 lb 6.4 oz) 48.2 kg (106 lb 4.2 oz)    Examination: Constitutional: NAD Filed Vitals:   10/12/15 2240 10/13/15 0500 10/13/15 0700 10/13/15 1151  BP: 125/64  106/70 116/65  Pulse: 78   90  Temp: 98.6 F (37 C)  98.5 F (36.9 C) 97.9 F (36.6 C)  TempSrc: Oral  Oral Oral  Resp: 18  18   Height: 5' 4"  (1.626 m)     Weight: 48.263 kg (106 lb 6.4 oz) 48.2 kg (106 lb 4.2 oz)    SpO2: 100%  100% 99%   ENMT: Mucous membranes are moist. No oropharyngeal exudates, mild erythema Respiratory: clear to auscultation bilaterally, no wheezing, no crackles. Normal respiratory effort. No accessory muscle use.  Cardiovascular: Regular rate and  rhythm, no murmurs / rubs / gallops. No LE edema. 2+ pedal pulses. No carotid bruits.  Abdomen: no tenderness. Bowel sounds positive.  Musculoskeletal: no clubbing / cyanosis.  Skin: no rashes, lesions, ulcers. No induration Neurologic: non focal    Data Reviewed: I have personally reviewed following labs and imaging studies  CBC:  Recent Labs Lab 10/12/15 1854 10/13/15 0346  WBC 8.7 7.5  HGB 12.1 11.4*  HCT 38.1 36.8  MCV 97.9 98.7  PLT 145* 248*   Basic Metabolic Panel:  Recent Labs Lab 10/12/15 1854 10/13/15 0346  NA 139 142  K 2.6* 3.6  CL 113* 116*  CO2 15* 12*  GLUCOSE 109* 112*  BUN 37* 36*   CREATININE 3.58* 2.98*  CALCIUM 8.2* 7.9*  MG  --  2.2  PHOS  --  4.0   GFR: Estimated Creatinine Clearance: 13.6 mL/min (by C-G formula based on Cr of 2.98). Liver Function Tests:  Recent Labs Lab 10/12/15 1854 10/13/15 0346  AST 15 13*  ALT 15 14  ALKPHOS 148* 125  BILITOT 0.4 0.4  PROT 6.8 6.1*  ALBUMIN 3.1* 2.6*   No results for input(s): LIPASE, AMYLASE in the last 168 hours. No results for input(s): AMMONIA in the last 168 hours. Coagulation Profile: No results for input(s): INR, PROTIME in the last 168 hours. Cardiac Enzymes:  Recent Labs Lab 10/13/15 10/13/15 0346 10/13/15 1104  TROPONINI <0.03 <0.03 <0.03   BNP (last 3 results) No results for input(s): PROBNP in the last 8760 hours. HbA1C: No results for input(s): HGBA1C in the last 72 hours. CBG: No results for input(s): GLUCAP in the last 168 hours. Lipid Profile: No results for input(s): CHOL, HDL, LDLCALC, TRIG, CHOLHDL, LDLDIRECT in the last 72 hours. Thyroid Function Tests:  Recent Labs  10/13/15 0346  TSH 0.706   Anemia Panel: No results for input(s): VITAMINB12, FOLATE, FERRITIN, TIBC, IRON, RETICCTPCT in the last 72 hours. Urine analysis:    Component Value Date/Time   COLORURINE YELLOW 09/09/2014 0850   APPEARANCEUR CLOUDY* 09/09/2014 0850   LABSPEC 1.010 09/09/2014 0850   PHURINE 5.5 09/09/2014 0850   GLUCOSEU NEGATIVE 09/09/2014 0850   HGBUR TRACE* 09/09/2014 0850   BILIRUBINUR NEGATIVE 09/09/2014 0850   [place] 09/09/2014 0850   PROTEINUR 30* 09/09/2014 0850   UROBILINOGEN 0.2 09/09/2014 0850   NITRITE NEGATIVE 09/09/2014 0850   LEUKOCYTESUR NEGATIVE 09/09/2014 0850   Sepsis Labs: Invalid input(s): PROCALCITONIN, LACTICIDVEN  No results found for this or any previous visit (from the past 240 hour(s)).    Radiology Studies: Dg Chest 2 View  10/12/2015  CLINICAL DATA:  Productive cough. EXAM: CHEST  2 VIEW COMPARISON:  Oct 16, 2014 FINDINGS: No pneumothorax. EKG  leads overlie the lung apices. The heart, hila, and mediastinum are unchanged. No pulmonary nodules or masses are identified. No focal infiltrates are seen to explain the patient's symptoms. IMPRESSION: No active cardiopulmonary disease. Electronically Signed   By: Dorise Bullion III M.D   On: 10/12/2015 19:52     Scheduled Meds: . aspirin EC  81 mg Oral Daily  . azithromycin  500 mg Oral Daily  . budesonide (PULMICORT) nebulizer solution  0.25 mg Nebulization BID  . calcitRIOL  0.5 mcg Oral Daily  . enoxaparin (LOVENOX) injection  30 mg Subcutaneous Q24H  . feeding supplement (ENSURE ENLIVE)  237 mL Oral BID BM  . ipratropium  0.5 mg Nebulization QID  . levothyroxine  125 mcg Oral QAC breakfast  . potassium chloride SA  40 mEq Oral BID  . predniSONE  60 mg Oral Q breakfast  . sodium bicarbonate  650 mg Oral BID  . sodium chloride flush  3 mL Intravenous Q12H   Continuous Infusions:    Marzetta Board, MD, PhD [facility] Pager [phone]  If 7PM-7AM, please contact night-coverage [email or web address] Password [email or web address] 10/13/2015, 12:50 PM

## 2015-10-13 NOTE — Progress Notes (Signed)
Refused bed alarm. Will continue to monitor patient.

## 2015-10-13 NOTE — Progress Notes (Signed)
Initial Nutrition Assessment  DOCUMENTATION CODES:  Underweight, Non-severe (moderate) malnutrition in context of chronic illness   Pt meets criteria for MODERATE MALNUTRITION in the context of Chronic illness as evidenced by Moderate fat/muscle wasting.  INTERVENTION:  Boost Breeze po BID, each supplement provides 250 kcal and 9 grams of protein  Education on wt maintenance, staying hydrated  Food preferences  NUTRITION DIAGNOSIS:  Increased nutrient needs related to altered GI function (Crohns) as evidenced by estimated nutritional requirements for this chronic disease and moderate muscle/fat wasting  GOAL:  Patient will meet greater than or equal to 90% of their needs  MONITOR:  PO intake, Supplement acceptance, Labs, Weight trends  REASON FOR ASSESSMENT:  Malnutrition Screening Tool    ASSESSMENT:  70 y/o female PMHx CKD, CHF, crohns w/ chronic diarrhea (s/p partial colectomy/ileostomy resections)-on humira, copd, gerd. Presents w/ 1 week cough, runny nose, fever, diarrhea, malaise. Family member has been ill. Admitted for COPD exacerbation, hypokalemia, AoCkd.   Pt is extremely pleasant. Spent some time discussing her crohns. . She states that her colectomy was only a partial colectomy. She hasnt needed further surgery for almost 30 years now. She notes diarrhea with many foods, mainly fat containing foods. She notes she cannot eat significant amounts of red meat, cheese, ensure/boost, legumes w/o suffering diarrhea. She notes she does not like the hospital food due to lack of seasoning. She likes salt and sweet foods.  During her flares she will have up to 11 bms a day, in remission it is closer to 4. She takes B12, mVI,, calcium, d3, and folate. Currently, she is not suffering from abnormal amounts of BMs. No n/v   She says her normal bw is 135 lbs and last weighed this 2 years ago. She dropped to 120 lbs recently and had plateaued until she suffered a crohns flare which  prompted her switch to humira. EMR documentation shows UBW to be closer to 110-120 lbs.   She reports significant frustration with her wt fluctuations. She says she works hard to gain weight, but will lose it so quickly during a relatively brief flare up.   RD went over ORS and how to best stay hydration during her flare ups. She says she has been hospitalized in past due to dehydration. Had discussion on ways to maintain/increase weight. She had not tried the Clear Ensure/boost supplements. Found a couple high kcal items she can tolerate such as ham/egg/chick salad, boiled eggs, peanut butter. Reccommended small frequent meals throughout day. She is able to eat some meats: lamb, veal, chicken, fish. Emphasized consuming these often to increase protein intake. Did note that she should not severely increase Protein intake due to CKD4.    While admitted, agreeable to [facility]. Took meal preferences  NFPE: Physical exam reveals moderate muscle/fat wasting in setting of chronic disease  Labs reviewed: Renal fx improving. Albumin: 2.6 H/H: 3.73/11.4, total pro: 6.1   Recent Labs Lab 10/12/15 1854 10/13/15 0346  NA 139 142  K 2.6* 3.6  CL 113* 116*  CO2 15* 12*  BUN 37* 36*  CREATININE 3.58* 2.98*  CALCIUM 8.2* 7.9*  MG  --  2.2  PHOS  --  4.0  GLUCOSE 109* 112*   Diet Order:  Diet Heart Room service appropriate?: Yes; Fluid consistency:: Thin  Skin:  Reviewed, no issues  Last BM:  5/19  Height:  Ht Readings from Last 1 Encounters:  10/12/15 5' 4"  (1.626 m)   Weight:  Wt Readings from Last  1 Encounters:  10/13/15 106 lb 4.2 oz (48.2 kg)   Wt Readings from Last 10 Encounters:  10/13/15 106 lb 4.2 oz (48.2 kg)  02/01/15 111 lb (50.349 kg)  01/24/15 111 lb (50.349 kg)  12/01/14 102 lb (46.267 kg)  10/26/14 106 lb (48.081 kg)  10/20/14 109 lb (49.442 kg)  09/25/14 109 lb (49.442 kg)  09/09/14 109 lb (49.442 kg)  05/05/14 109 lb (49.442 kg)  04/05/14 115 lb (52.164 kg)    Ideal Body Weight:  54.55 kg  BMI:  Body mass index is 18.23 kg/(m^2).  Estimated Nutritional Needs:  Kcal:  1700-1900 (35-40 kcal/kg bw) Protein:  40-50 g (.8-1 g/kg bw-somewhat limit due to CKD4) Fluid:  1.7 liters  EDUCATION NEEDS:  Education needs addressed  Burtis Junes RD, LDN Clinical Nutrition Pager: [phone] 10/13/2015 6:20 PM

## 2015-10-14 LAB — BASIC METABOLIC PANEL
Anion gap: 7 (ref 5–15)
BUN: 40 mg/dL — AB (ref 6–20)
CO2: 11 mmol/L — AB (ref 22–32)
Calcium: 8.2 mg/dL — ABNORMAL LOW (ref 8.9–10.3)
Chloride: 119 mmol/L — ABNORMAL HIGH (ref 101–111)
Creatinine, Ser: 2.99 mg/dL — ABNORMAL HIGH (ref 0.44–1.00)
GFR calc Af Amer: 17 mL/min — ABNORMAL LOW (ref >60)
GFR calc non Af Amer: 15 mL/min — ABNORMAL LOW (ref >60)
Glucose, Bld: 84 mg/dL (ref 65–99)
POTASSIUM: 4.1 mmol/L (ref 3.5–5.1)
SODIUM: 137 mmol/L (ref 135–145)

## 2015-10-14 MED ORDER — SODIUM BICARBONATE 650 MG PO TABS
650.0000 mg | ORAL_TABLET | Freq: Three times a day (TID) | ORAL | Status: DC
  Administered 2015-10-14 – 2015-10-15 (×3): 650 mg via ORAL
  Filled 2015-10-14 (×3): qty 1

## 2015-10-14 NOTE — Progress Notes (Signed)
PROGRESS NOTE  TAGEN BRETHAUER OBS:[id number] DOB: [birth year] DOA: 10/12/2015 PCP: Lottie Dawson, MD   LOS: 2 days   Brief Narrative: 70 y.o. female with medical history significant of CKD cr 3.1, HTN, Hypokalemia, presented with 1 Week hx of increased cough productive of green sputum, runny nose, fever, diarrhea, generalize malaise and muscle aches, wheezing, admitted for COPD exacerbation  Assessment & Plan: Active Problems:   Crohn's disease s/p Colectomy, Terminal ilectomies.  Managed by [facility]   Metabolic acidosis   Hypokalemia   CKD (chronic kidney disease) stage 4, GFR 15-29 ml/min (HCC)   Chronic combined systolic and diastolic CHF (congestive heart failure) (HCC)   Essential hypertension   Acute on chronic renal failure (HCC)   COPD exacerbation (HCC)   COPD exacerbation - wheezing resolved - continue nebulizers - continue Azithromycin - respiratory panel negative - has not been out of room, encouraged more ambulation today, continue current treatment, likely home tomorrow   Hypokalemia  - replace and monitor. Careful given renal failure  AKI on CKD IV - mild improvement with hydration, stable today  Chronic combined systolic and diastolic CHF (congestive heart failure) ([place]) - Appears euvolemic  Crohn's disease  - s/p Colectomy, Terminal ilectomies. - Managed by [facility] - hold Humira patient will be on steroids for COPD   Essential hypertension  - stable continue home medications   Metabolic acidosis  - continue home bicarbonate, increased to 3 times daily Today   Chest pain - most likely secondary to coughing.  - Given risk factors will monitor on telemetry - cardiac enzymes negative x 3. Resolved   DVT prophylaxis: Lovenox Code Status: Full Family Communication: no family bedside Disposition Plan: home when ready, 1-2 days  Consultants:   None   Procedures:   None   Antimicrobials:  Azithromycin 5/19 >>    Subjective: - feeling better this morning, less short of breath, no chest pain / palpitations  - Able to ambulate in the room with minimal dyspnea, she will try to ambulate in the hallway later today   Objective: Filed Vitals:   10/13/15 1203 10/13/15 2038 10/13/15 2117 10/14/15 0617  BP:   114/67 105/55  Pulse:   85 79  Temp:   97.8 F (36.6 C) 97.9 F (36.6 C)  TempSrc:   Oral Oral  Resp:   18 18  Height:      Weight:    49.215 kg (108 lb 8 oz)  SpO2: 99% 100% 99% 100%    Intake/Output Summary (Last 24 hours) at 10/14/15 1259 Last data filed at 10/14/15 0936  Gross per 24 hour  Intake   1278 ml  Output    575 ml  Net    703 ml   Filed Weights   10/12/15 2240 10/13/15 0500 10/14/15 0617  Weight: 48.263 kg (106 lb 6.4 oz) 48.2 kg (106 lb 4.2 oz) 49.215 kg (108 lb 8 oz)    Examination: Constitutional: NAD Filed Vitals:   10/13/15 1203 10/13/15 2038 10/13/15 2117 10/14/15 0617  BP:   114/67 105/55  Pulse:   85 79  Temp:   97.8 F (36.6 C) 97.9 F (36.6 C)  TempSrc:   Oral Oral  Resp:   18 18  Height:      Weight:    49.215 kg (108 lb 8 oz)  SpO2: 99% 100% 99% 100%   ENMT: Mucous membranes are moist. No oropharyngeal exudates, mild erythema Respiratory: clear to auscultation bilaterally, no wheezing, no  crackles. Normal respiratory effort. No accessory muscle use.  Cardiovascular: Regular rate and rhythm, no murmurs / rubs / gallops. No LE edema. 2+ pedal pulses. No carotid bruits.  Abdomen: no tenderness. Bowel sounds positive.  Musculoskeletal: no clubbing / cyanosis.  Skin: no rashes, lesions, ulcers. No induration Neurologic: non focal    Data Reviewed: I have personally reviewed following labs and imaging studies  CBC:  Recent Labs Lab 10/12/15 1854 10/13/15 0346  WBC 8.7 7.5  HGB 12.1 11.4*  HCT 38.1 36.8  MCV 97.9 98.7  PLT 145* 973*   Basic Metabolic Panel:  Recent Labs Lab 10/12/15 1854 10/13/15 0346 10/14/15 0421  NA 139 142 137   K 2.6* 3.6 4.1  CL 113* 116* 119*  CO2 15* 12* 11*  GLUCOSE 109* 112* 84  BUN 37* 36* 40*  CREATININE 3.58* 2.98* 2.99*  CALCIUM 8.2* 7.9* 8.2*  MG  --  2.2  --   PHOS  --  4.0  --    GFR: Estimated Creatinine Clearance: 13.8 mL/min (by C-G formula based on Cr of 2.99). Liver Function Tests:  Recent Labs Lab 10/12/15 1854 10/13/15 0346  AST 15 13*  ALT 15 14  ALKPHOS 148* 125  BILITOT 0.4 0.4  PROT 6.8 6.1*  ALBUMIN 3.1* 2.6*   No results for input(s): LIPASE, AMYLASE in the last 168 hours. No results for input(s): AMMONIA in the last 168 hours. Coagulation Profile: No results for input(s): INR, PROTIME in the last 168 hours. Cardiac Enzymes:  Recent Labs Lab 10/13/15 10/13/15 0346 10/13/15 1104  TROPONINI <0.03 <0.03 <0.03   BNP (last 3 results) No results for input(s): PROBNP in the last 8760 hours. HbA1C: No results for input(s): HGBA1C in the last 72 hours. CBG: No results for input(s): GLUCAP in the last 168 hours. Lipid Profile: No results for input(s): CHOL, HDL, LDLCALC, TRIG, CHOLHDL, LDLDIRECT in the last 72 hours. Thyroid Function Tests:  Recent Labs  10/13/15 0346  TSH 0.706   Anemia Panel: No results for input(s): VITAMINB12, FOLATE, FERRITIN, TIBC, IRON, RETICCTPCT in the last 72 hours. Urine analysis:    Component Value Date/Time   COLORURINE YELLOW 10/13/2015 [place] 10/13/2015 1658   LABSPEC 1.015 10/13/2015 1658   PHURINE 6.0 10/13/2015 1658   GLUCOSEU NEGATIVE 10/13/2015 1658   HGBUR SMALL* 10/13/2015 1658   BILIRUBINUR NEGATIVE 10/13/2015 1658   [place] 10/13/2015 1658   PROTEINUR 30* 10/13/2015 1658   UROBILINOGEN 0.2 09/09/2014 0850   NITRITE NEGATIVE 10/13/2015 1658   LEUKOCYTESUR NEGATIVE 10/13/2015 1658   Sepsis Labs: Invalid input(s): PROCALCITONIN, LACTICIDVEN  Recent Results (from the past 240 hour(s))  Respiratory Panel by PCR     Status: None   Collection Time: 10/13/15 12:55 AM   Result Value Ref Range Status   Adenovirus NOT DETECTED NOT DETECTED Final   Coronavirus 229E NOT DETECTED NOT DETECTED Final   Coronavirus HKU1 NOT DETECTED NOT DETECTED Final   Coronavirus NL63 NOT DETECTED NOT DETECTED Final   Coronavirus OC43 NOT DETECTED NOT DETECTED Final   Metapneumovirus NOT DETECTED NOT DETECTED Final   Rhinovirus / Enterovirus NOT DETECTED NOT DETECTED Final   Influenza A NOT DETECTED NOT DETECTED Final   Influenza A H1 NOT DETECTED NOT DETECTED Final   Influenza A H1 2009 NOT DETECTED NOT DETECTED Final   Influenza A H3 NOT DETECTED NOT DETECTED Final   Influenza B NOT DETECTED NOT DETECTED Final   Parainfluenza Virus 1 NOT DETECTED NOT  DETECTED Final   Parainfluenza Virus 2 NOT DETECTED NOT DETECTED Final   Parainfluenza Virus 3 NOT DETECTED NOT DETECTED Final   Parainfluenza Virus 4 NOT DETECTED NOT DETECTED Final   Respiratory Syncytial Virus NOT DETECTED NOT DETECTED Final   Bordetella pertussis NOT DETECTED NOT DETECTED Final   Chlamydophila pneumoniae NOT DETECTED NOT DETECTED Final   Mycoplasma pneumoniae NOT DETECTED NOT DETECTED Final      Radiology Studies: Dg Chest 2 View  10/12/2015  CLINICAL DATA:  Productive cough. EXAM: CHEST  2 VIEW COMPARISON:  Oct 16, 2014 FINDINGS: No pneumothorax. EKG leads overlie the lung apices. The heart, hila, and mediastinum are unchanged. No pulmonary nodules or masses are identified. No focal infiltrates are seen to explain the patient's symptoms. IMPRESSION: No active cardiopulmonary disease. Electronically Signed   By: Dorise Bullion III M.D   On: 10/12/2015 19:52     Scheduled Meds: . aspirin EC  81 mg Oral Daily  . azithromycin  500 mg Oral Daily  . budesonide (PULMICORT) nebulizer solution  0.25 mg Nebulization BID  . calcitRIOL  0.5 mcg Oral Daily  . enoxaparin (LOVENOX) injection  30 mg Subcutaneous Q24H  . feeding supplement  1 Container Oral BID BM  . levothyroxine  125 mcg Oral QAC breakfast  .  potassium chloride SA  40 mEq Oral BID  . predniSONE  60 mg Oral Q breakfast  . sodium bicarbonate  650 mg Oral TID  . sodium chloride flush  3 mL Intravenous Q12H   Continuous Infusions:    Marzetta Board, MD, PhD [facility] Pager [phone]  If 7PM-7AM, please contact night-coverage [email or web address] Password [facility] 10/14/2015, 12:59 PM

## 2015-10-15 MED ORDER — GUAIFENESIN-DM 100-10 MG/5ML PO SYRP: 5.0000 mL | ORAL_SOLUTION | ORAL | Status: DC | PRN

## 2015-10-15 MED ORDER — AZITHROMYCIN 500 MG PO TABS: 500.0000 mg | ORAL_TABLET | Freq: Every day | ORAL | Status: DC

## 2015-10-15 MED ORDER — PREDNISONE 10 MG PO TABS: 40.0000 mg | ORAL_TABLET | Freq: Every day | ORAL | Status: DC

## 2015-10-15 NOTE — Progress Notes (Signed)
Refused bed alarm. Will continue to monitor patient.

## 2015-10-15 NOTE — Care Management Important Message (Signed)
Important Message  Patient Details  Name: Natasha [name] MRN: [id number] Date of Birth: 06/24/1945   Medicare Important Message Given:  Yes    Loann Quill 10/15/2015, 3:22 PM

## 2015-10-15 NOTE — Discharge Summary (Signed)
Physician Discharge Summary  Natasha [name] ZOX:[id number] DOB: 11-03-1945 DOA: 10/12/2015  PCP: Lottie Dawson, MD  Admit date: 10/12/2015 Discharge date: 10/15/2015  Time spent: > 30 minutes  Recommendations for Outpatient Follow-up:  1. Follow up with PCP in 1-2 weeks  2. Azithromycin for 2 more days 3. Prednisone taper   Discharge Diagnoses:  Active Problems:   Crohn's disease s/p Colectomy, Terminal ilectomies.  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 female with medical history significant of CKD cr 3.1, HTN, Hypokalemia, presented with 1 Week hx of increased cough productive of green sputum, runny nose, fever, diarrhea, generalize malaise and muscle aches, wheezing, admitted for COPD exacerbation  Hospital Course:  COPD exacerbation - wheezing resolved, breathing comfortable on room air, able to ambulate in the hallway without further issues. She will be discharged home in stable condition with 2 additional days of Azithromycin and a prednisone taper. Respiratory panel negative AKI on CKD IV - overall stable.  Chronic combined systolic and diastolic CHF (congestive heart failure) ([place]) - Appears euvolemic Crohn's disease - s/p Colectomy, Terminal ilectomies. Man[facility] by [facility] Essential hypertension - stable continue home medications  Chest pain - most likely secondary to coughing.    Procedures:  None    Consultations:  None   Discharge Exam: Filed Vitals:   10/14/15 1957 10/14/15 2008 10/15/15 0446 10/15/15 1115  BP:  124/63 109/55 114/53  Pulse:  81 66 90  Temp:  98.7 F (37.1 C) 98.2 F (36.8 C) 98.3 F (36.8 C)  TempSrc:  Oral Oral Oral  Resp:  18 18 16   Height:      Weight:   49.306 kg (108 lb 11.2 oz)   SpO2: 99% 98% 99% 99%    General: NAD Cardiovascular: RRR Respiratory: CTA biL  Discharge Instructions Activity:  As tolerated   Get Medicines reviewed and adjusted: Please take all your medications with you for your next visit with your Primary MD  Please request your Primary MD to go over all hospital tests and procedure/radiological results at the follow up, please ask your Primary MD to get all Hospital records sent to his/her office.  If you experience worsening of your admission symptoms, develop shortness of breath, life threatening emergency, suicidal or homicidal thoughts you must seek medical attention immediately by calling 911 or calling your MD immediately if symptoms less severe.  You must read complete instructions/literature along with all the possible adverse reactions/side effects for all the Medicines you take and that have been prescribed to you. Take any new Medicines after you have completely understood and accpet all the possible adverse reactions/side effects.   Do not drive when taking Pain medications.   Do not take more than prescribed Pain, Sleep and Anxiety Medications  Special Instructions: If you have smoked or chewed Tobacco in the last 2 yrs please  stop smoking, stop any regular Alcohol and or any Recreational drug use.  Wear Seat belts while driving.  Please note  You were cared for by a hospitalist during your hospital stay. Once you are discharged, your primary care physician will handle any further medical issues. Please note that NO REFILLS for any discharge medications will be authorized once  you are discharged, as it is imperative that you return to your primary care physician (or establish a relationship with a primary care physician if you do not have one) for your aftercare needs so that they can reassess your need for medications and monitor your lab values.    Medication List    TAKE these medications        acetaminophen 500 MG tablet  Commonly known as:  TYLENOL  Take 500 mg by mouth every 6 (six) hours as needed for mild pain.     albuterol 108 (90 Base) MCG/ACT inhaler  Commonly known as:  PROVENTIL HFA;VENTOLIN HFA  Inhale 2 puffs into the lungs every 6 (six) hours as needed for wheezing or shortness of breath.     azithromycin 500 MG tablet  Commonly known as:  ZITHROMAX  Take 1 tablet (500 mg total) by mouth daily.     calcitRIOL 0.5 MCG capsule  Commonly known as:  ROCALTROL  Take 0.5 mcg by mouth daily.     cyanocobalamin 1000 MCG/ML injection  Commonly known as:  (VITAMIN B-12)  Inject 1,000 mcg into the muscle every 30 (thirty) days. Last injection was 14/78/29     folic acid 1 MG tablet  Commonly known as:  FOLVITE  Take 1 mg by mouth daily.     guaiFENesin-dextromethorphan 100-10 MG/5ML syrup  Commonly known as:  ROBITUSSIN DM  Take 5 mLs by mouth every 4 (four) hours as needed for cough.     HUMIRA 40 MG/0.8ML Pskt  Generic drug:  Adalimumab  Inject 40 mg into the skin every 14 (fourteen) days.     levothyroxine 125 MCG tablet  Commonly known as:  SYNTHROID, LEVOTHROID  Take 125 mcg by mouth every morning.     potassium chloride SA 20 MEQ tablet  Commonly known as:  K-DUR,KLOR-CON  Take 40 mEq by mouth 2 (two) times daily.     predniSONE 5 MG tablet  Commonly known as:  DELTASONE  Take 1 tablet (5 mg total) by mouth daily with breakfast.     predniSONE 10 MG tablet  Commonly known as:  DELTASONE  Take 4 tablets (40 mg total) by mouth daily with breakfast. 40 mg x 2 days then 20 mg x 2 days then back to 5 mg daily as before      promethazine 25 MG tablet  Commonly known as:  PHENERGAN  Take 25 mg by mouth every 6 (six) hours as needed for nausea or vomiting.     QVAR 80 MCG/ACT inhaler  Generic drug:  beclomethasone  Inhale 2 puffs into the lungs as needed (for wheezing).     sodium bicarbonate 650 MG tablet  Take 1 tablet (650 mg total) by mouth 2 (two) times daily.     SPIRIVA HANDIHALER 18 MCG inhalation capsule  Generic drug:  tiotropium  Place 18 mcg into inhaler and inhale as needed (for COPD).     Vitamin D3 2000 units Tabs  Take 2,000 Units by mouth daily.           Follow-up Information    Follow up with Shamleffer, Herschell Dimes,  MD. Daphane Shepherd on 10/23/2015.   Specialty:  Internal Medicine   Why:  @3 ;45pm With Dr. Lillia Dallas information:   [facility]  [place] [address] [phone]       The results of significant diagnostics from this hospitalization (including imaging, microbiology, ancillary and laboratory) are listed below for reference.    Significant Diagnostic Studies: Dg Chest 2 View  10/12/2015  CLINICAL DATA:  Productive cough. EXAM: CHEST  2 VIEW COMPARISON:  Oct 16, 2014 FINDINGS: No pneumothorax. EKG leads overlie the lung apices. The heart, hila, and mediastinum are unchanged. No pulmonary nodules or masses are identified. No focal infiltrates are seen to explain the patient's symptoms. IMPRESSION: No active cardiopulmonary disease. Electronically Signed   By: Dorise Bullion III M.D   On: 10/12/2015 19:52    Microbiology: Recent Results (from the past 240 hour(s))  Respiratory Panel by PCR     Status: None   Collection Time: 10/13/15 12:55 AM  Result Value Ref Range Status   Adenovirus NOT DETECTED NOT DETECTED Final   Coronavirus 229E NOT DETECTED NOT DETECTED Final   Coronavirus HKU1 NOT DETECTED NOT DETECTED Final   Coronavirus NL63 NOT DETECTED NOT DETECTED Final   Coronavirus OC43 NOT DETECTED NOT DETECTED Final   Metapneumovirus NOT  DETECTED NOT DETECTED Final   Rhinovirus / Enterovirus NOT DETECTED NOT DETECTED Final   Influenza A NOT DETECTED NOT DETECTED Final   Influenza A H1 NOT DETECTED NOT DETECTED Final   Influenza A H1 2009 NOT DETECTED NOT DETECTED Final   Influenza A H3 NOT DETECTED NOT DETECTED Final   Influenza B NOT DETECTED NOT DETECTED Final   Parainfluenza Virus 1 NOT DETECTED NOT DETECTED Final   Parainfluenza Virus 2 NOT DETECTED NOT DETECTED Final   Parainfluenza Virus 3 NOT DETECTED NOT DETECTED Final   Parainfluenza Virus 4 NOT DETECTED NOT DETECTED Final   Respiratory Syncytial Virus NOT DETECTED NOT DETECTED Final   Bordetella pertussis NOT DETECTED NOT DETECTED Final   Chlamydophila pneumoniae NOT DETECTED NOT DETECTED Final   Mycoplasma pneumoniae NOT DETECTED NOT DETECTED Final     Labs: Basic Metabolic Panel:  Recent Labs Lab 10/12/15 1854 10/13/15 0346 10/14/15 0421  NA 139 142 137  K 2.6* 3.6 4.1  CL 113* 116* 119*  CO2 15* 12* 11*  GLUCOSE 109* 112* 84  BUN 37* 36* 40*  CREATININE 3.58* 2.98* 2.99*  CALCIUM 8.2* 7.9* 8.2*  MG  --  2.2  --   PHOS  --  4.0  --    Liver Function Tests:  Recent Labs Lab 10/12/15 1854 10/13/15 0346  AST 15 13*  ALT 15 14  ALKPHOS 148* 125  BILITOT 0.4 0.4  PROT 6.8 6.1*  ALBUMIN 3.1* 2.6*   No results for input(s): LIPASE, AMYLASE in the last 168 hours. No results for input(s): AMMONIA in the last 168 hours. CBC:  Recent Labs Lab 10/12/15 1854 10/13/15 0346  WBC 8.7 7.5  HGB 12.1 11.4*  HCT 38.1 36.8  MCV 97.9 98.7  PLT 145* 131*   Cardiac Enzymes:  Recent Labs Lab 10/13/15 10/13/15 0346 10/13/15 1104  TROPONINI <0.03 <0.03 <0.03   BNP: BNP (last 3 results) No results for input(s): BNP in the last 8760 hours.  ProBNP (last 3 results) No results for input(s): PROBNP in the last 8760 hours.  CBG: No results for input(s): GLUCAP in the last 168 hours.     Signed:  Marzetta Board  [facility] 10/15/2015, 4:17 PM

## 2015-10-15 NOTE — Discharge Instructions (Signed)
Follow with Shamleffer, Herschell Dimes, MD in 5-7 days  Please get a complete blood count and chemistry panel checked by your Primary MD at your next visit, and again as instructed by your Primary MD. Please get your medications reviewed and adjusted by your Primary MD.  Please request your Primary MD to go over all Hospital Tests and Procedure/Radiological results at the follow up, please get all Hospital records sent to your Prim MD by signing hospital release before you go home.  If you had Pneumonia of Lung problems at the Hospital: Please get a 2 view Chest X ray done in 6-8 weeks after hospital discharge or sooner if instructed by your Primary MD.  If you have Congestive Heart Failure: Please call your Cardiologist or Primary MD anytime you have any of the following symptoms:  1) 3 pound weight gain in 24 hours or 5 pounds in 1 week  2) shortness of breath, with or without a dry hacking cough  3) swelling in the hands, feet or stomach  4) if you have to sleep on extra pillows at night in order to breathe  Follow cardiac low salt diet and 1.5 lit/day fluid restriction.  If you have diabetes Accuchecks 4 times/day, Once in AM empty stomach and then before each meal. Log in all results and show them to your primary doctor at your next visit. If any glucose reading is under 80 or above 300 call your primary MD immediately.  If you have Seizure/Convulsions/Epilepsy: Please do not drive, operate heavy machinery, participate in activities at heights or participate in high speed sports until you have seen by Primary MD or a Neurologist and advised to do so again.  If you had Gastrointestinal Bleeding: Please ask your Primary MD to check a complete blood count within one week of discharge or at your next visit. Your endoscopic/colonoscopic biopsies that are pending at the time of discharge, will also need to followed by your Primary MD.  Get Medicines reviewed and adjusted. Please take  all your medications with you for your next visit with your Primary MD  Please request your Primary MD to go over all hospital tests and procedure/radiological results at the follow up, please ask your Primary MD to get all Hospital records sent to his/her office.  If you experience worsening of your admission symptoms, develop shortness of breath, life threatening emergency, suicidal or homicidal thoughts you must seek medical attention immediately by calling 911 or calling your MD immediately  if symptoms less severe.  You must read complete instructions/literature along with all the possible adverse reactions/side effects for all the Medicines you take and that have been prescribed to you. Take any new Medicines after you have completely understood and accpet all the possible adverse reactions/side effects.   Do not drive or operate heavy machinery when taking Pain medications.   Do not take more than prescribed Pain, Sleep and Anxiety Medications  Special Instructions: If you have smoked or chewed Tobacco  in the last 2 yrs please stop smoking, stop any regular Alcohol  and or any Recreational drug use.  Wear Seat belts while driving.  Please note You were cared for by a hospitalist during your hospital stay. If you have any questions about your discharge medications or the care you received while you were in the hospital after you are discharged, you can call the unit and asked to speak with the hospitalist on call if the hospitalist that took care of you is not available.  Once you are discharged, your primary care physician will handle any further medical issues. Please note that NO REFILLS for any discharge medications will be authorized once you are discharged, as it is imperative that you return to your primary care physician (or establish a relationship with a primary care physician if you do not have one) for your aftercare needs so that they can reassess your need for medications and  monitor your lab values.  You can reach the hospitalist office at phone [phone] or fax [phone]   If you do not have a primary care physician, you can call [phone] for a physician referral.  Activity: As tolerated with Full fall precautions use walker/cane & assistance as needed  Diet: renal  Disposition Home

## 2016-03-17 ENCOUNTER — Emergency Department (HOSPITAL_COMMUNITY): Payer: Medicare Other

## 2016-03-17 ENCOUNTER — Encounter (HOSPITAL_COMMUNITY): Payer: Self-pay | Admitting: Emergency Medicine

## 2016-03-17 ENCOUNTER — Emergency Department (HOSPITAL_COMMUNITY)
Admission: EM | Admit: 2016-03-17 | Discharge: 2016-03-17 | Disposition: A | Discharge status: Home / Self Care | Payer: Medicare Other | Attending: Emergency Medicine | Admitting: Emergency Medicine

## 2016-03-17 DIAGNOSIS — R0789 Other chest pain: Secondary | ICD-10-CM | POA: Diagnosis not present

## 2016-03-17 DIAGNOSIS — S4992XA Unspecified injury of left shoulder and upper arm, initial encounter: Secondary | ICD-10-CM | POA: Diagnosis present

## 2016-03-17 DIAGNOSIS — Z87891 Personal history of nicotine dependence: Secondary | ICD-10-CM | POA: Insufficient documentation

## 2016-03-17 DIAGNOSIS — X58XXXA Exposure to other specified factors, initial encounter: Secondary | ICD-10-CM | POA: Insufficient documentation

## 2016-03-17 DIAGNOSIS — I130 Hypertensive heart and chronic kidney disease with heart failure and stage 1 through stage 4 chronic kidney disease, or unspecified chronic kidney disease: Secondary | ICD-10-CM | POA: Diagnosis not present

## 2016-03-17 DIAGNOSIS — S46112A Strain of muscle, fascia and tendon of long head of biceps, left arm, initial encounter: Secondary | ICD-10-CM | POA: Insufficient documentation

## 2016-03-17 DIAGNOSIS — R110 Nausea: Secondary | ICD-10-CM

## 2016-03-17 DIAGNOSIS — S46212A Strain of muscle, fascia and tendon of other parts of biceps, left arm, initial encounter: Secondary | ICD-10-CM

## 2016-03-17 DIAGNOSIS — Y999 Unspecified external cause status: Secondary | ICD-10-CM | POA: Diagnosis not present

## 2016-03-17 DIAGNOSIS — Y929 Unspecified place or not applicable: Secondary | ICD-10-CM | POA: Diagnosis not present

## 2016-03-17 DIAGNOSIS — N183 Chronic kidney disease, stage 3 (moderate): Secondary | ICD-10-CM | POA: Diagnosis not present

## 2016-03-17 DIAGNOSIS — E039 Hypothyroidism, unspecified: Secondary | ICD-10-CM | POA: Insufficient documentation

## 2016-03-17 DIAGNOSIS — I5042 Chronic combined systolic (congestive) and diastolic (congestive) heart failure: Secondary | ICD-10-CM | POA: Insufficient documentation

## 2016-03-17 DIAGNOSIS — K50919 Crohn's disease, unspecified, with unspecified complications: Secondary | ICD-10-CM | POA: Diagnosis not present

## 2016-03-17 DIAGNOSIS — Y939 Activity, unspecified: Secondary | ICD-10-CM | POA: Diagnosis not present

## 2016-03-17 DIAGNOSIS — N184 Chronic kidney disease, stage 4 (severe): Secondary | ICD-10-CM | POA: Diagnosis not present

## 2016-03-17 DIAGNOSIS — J449 Chronic obstructive pulmonary disease, unspecified: Secondary | ICD-10-CM | POA: Insufficient documentation

## 2016-03-17 LAB — BASIC METABOLIC PANEL
Anion gap: 8 (ref 5–15)
BUN: 34 mg/dL — AB (ref 6–20)
CALCIUM: 8.1 mg/dL — AB (ref 8.9–10.3)
CO2: 12 mmol/L — ABNORMAL LOW (ref 22–32)
CREATININE: 3.23 mg/dL — AB (ref 0.44–1.00)
Chloride: 121 mmol/L — ABNORMAL HIGH (ref 101–111)
GFR calc Af Amer: 16 mL/min — ABNORMAL LOW (ref >60)
GFR calc non Af Amer: 14 mL/min — ABNORMAL LOW (ref >60)
Glucose, Bld: 76 mg/dL (ref 65–99)
POTASSIUM: 3.1 mmol/L — AB (ref 3.5–5.1)
SODIUM: 141 mmol/L (ref 135–145)

## 2016-03-17 LAB — I-STAT TROPONIN, ED: Troponin i, poc: 0 ng/mL (ref 0.00–0.08)

## 2016-03-17 LAB — CBC
HCT: 36.6 % (ref 36.0–46.0)
Hemoglobin: 11.6 g/dL — ABNORMAL LOW (ref 12.0–15.0)
MCH: 30.7 pg (ref 26.0–34.0)
MCHC: 31.7 g/dL (ref 30.0–36.0)
MCV: 96.8 fL (ref 78.0–100.0)
PLATELETS: 130 10*3/uL — AB (ref 150–400)
RBC: 3.78 MIL/uL — ABNORMAL LOW (ref 3.87–5.11)
RDW: 15.9 % — AB (ref 11.5–15.5)
WBC: 9 10*3/uL (ref 4.0–10.5)

## 2016-03-17 MED ORDER — PREDNISONE 5 MG PO TABS: 10.0000 mg | ORAL_TABLET | Freq: Every day | ORAL | Status: AC

## 2016-03-17 MED ORDER — TRAMADOL HCL 50 MG PO TABS: 50.0000 mg | ORAL_TABLET | Freq: Four times a day (QID) | ORAL | 0 refills | Status: DC | PRN

## 2016-03-17 MED ORDER — TRAMADOL HCL 50 MG PO TABS
50.0000 mg | ORAL_TABLET | Freq: Once | ORAL | Status: AC
  Administered 2016-03-17: 50 mg via ORAL
  Filled 2016-03-17: qty 1

## 2016-03-17 NOTE — ED Notes (Signed)
Pt wheeled to waiting room to be transported home by husband. NAD. Verbalizes understanding of discharge instructions. A/o x4.

## 2016-03-17 NOTE — ED Provider Notes (Signed)
[facility] DEPT Provider Note   CSN: [id number] Arrival date & time: 03/17/16  1139     History   Chief Complaint Chief Complaint  Patient presents with  . Chest Pain  . Nausea    HPI Natasha [name] is a 69 y.o. female with a hx of crohn's, on chronic steroids at 75m daily as well as humira, CKD, but no hx of CAD, presents to the ED noting waxing and waning nausea since last week without vomiting. Additionally she notes progressively worsening left bicep pain and chest tenderness over the same time period now to the point that she doesn't want to move or use her left arm like she normally does due to pain. She describes her pain as sharp and achy, feeling like the muscle is tight in her arm, worse with full extension of left left arm and with active flexion of her arm muscles and chest muscles. She denies any known signficant change in her activity level or trauma to precipitate injury. She states that she has not taken any thing for her pain. She states that her nausea feels like her crohns is beginning to flare up, similar to how it has in the past.   HPI  Past Medical History:  Diagnosis Date  . Anal fistula   . Anemia in chronic kidney disease (CKD)   . Arthritis   . Borderline hypertension   . Bulging disc    L3-L4  . Chronic diarrhea    due to crohn's  . CKD (chronic kidney disease), stage IV (HF[place]    NEPHROLOGIST--  DR MATTINGLY  . Crohn's disease ([place]    chronic ileitis  . Emphysema/COPD ([place]   . GERD (gastroesophageal reflux disease)   . History of glaucoma   . History of pancreatitis    2008--  mercaptopurine  . History of small bowel obstruction    12-03-2010  due to crohn's ileitis  . Hypothyroidism, postsurgical    multinodule w/ hurthle cells  . Nephrolithiasis    bilateral  . Osteoporosis   . Perianal Crohn's disease ([place]   . Polyarthralgia   . Wears partial dentures    upper    Patient Active Problem List   Diagnosis Date Noted  . COPD  exacerbation (HDana [place] 10/12/2015  . Malnutrition of moderate degree (H[place] 09/13/2014  . history of Left leg DVT 09/10/2014  . Hypotension 09/10/2014  . Hypoglycemia 09/10/2014  . Ischiorectal abscess 09/09/2014  . Increased anion gap metabolic acidosis   . Generalized abdominal pain   . Chronic combined systolic and diastolic CHF (congestive heart failure) (HDavy   . Essential hypertension   . Acute on chronic renal failure (HG[place]   . Secondary hyperparathyroidism (HW[place]   . Other specified hypothyroidism   . Right shoulder pain   . Thrombocytopenia ([place]   . Severe protein-calorie malnutrition (HS[place]   . Thyroid activity decreased   . Emphysema of lung (HS[place]   . Nephrolithiasis 03/29/2014  . AKI (acute kidney injury) ([place] 03/29/2014  . Spinal stenosis of lumbar region 11/01/2013  . Bilateral leg pain 09/28/2013  . Emphysema/COPD (H[place]   . Dyspnea 09/20/2013  . Protein-calorie malnutrition, severe (HFaribault 05/14/2013  . Salmonella enteritidis 06/11/2012  . Hypocalcemia 06/10/2012  . Hypothyroidism 06/10/2012  . Anemia 06/09/2012  . Hypomagnesemia 06/09/2012  . CKD (chronic kidney disease) 06/09/2012  . CKD (chronic kidney disease) stage 4, GFR 15-29 ml/min (HCC) 06/09/2012  . Acute renal failure (H[place] 06/07/2012  . Dehydration 06/07/2012  . Crohn's  disease s/p Colectomy, Terminal ilectomies.  Managed by [facility] 06/07/2012  . Metabolic acidosis 21/30/8657  . Hypokalemia 06/07/2012  . Diarrhea 06/07/2012  . UTI (lower urinary tract infection) 06/07/2012    Past Surgical History:  Procedure Laterality Date  . ABDOMINAL HYSTERECTOMY  1990   and  Appendectomy  . COLON RESECTION  x3 --  1978,  1987, 1989   ILEAL RESECTION x2/   [place]  . COLONOSCOPY WITH PROPOFOL N/A 09/25/2014   Procedure: COLONOSCOPY WITH PROPOFOL;  Surgeon: Garlan Fair, MD;  Location: [facility];  Service: Endoscopy;  Laterality: N/A;  . ESOPHAGOGASTRODUODENOSCOPY  03/04/2012   Procedure:  ESOPHAGOGASTRODUODENOSCOPY (EGD);  Surgeon: Garlan Fair, MD;  Location: [facility];  Service: Endoscopy;  Laterality: N/A;  . EXAMINATION UNDER ANESTHESIA N/A 09/12/2014   Procedure: EXAM UNDER ANESTHESIA;  Surgeon: Rolm Bookbinder, MD;  Location: [facility];  Service: General;  Laterality: N/A;  . FLEXIBLE SIGMOIDOSCOPY  03/04/2012   Procedure: FLEXIBLE SIGMOIDOSCOPY;  Surgeon: Garlan Fair, MD;  Location: [facility];  Service: Endoscopy;  Laterality: N/A;  . GLAUCOMA SURGERY Bilateral   . INCISION AND DRAINAGE PERIRECTAL ABSCESS N/A 09/12/2014   Procedure: IRRIGATION AND DEBRIDEMENT PERIRECTAL ABSCESS;  Surgeon: Rolm Bookbinder, MD;  Location: [facility];  Service: General;  Laterality: N/A;  . NEGATIVE SLEEP STUDY  2008  . PLACEMENT OF SETON N/A 12/01/2014   Procedure: PLACEMENT OF SETON;  Surgeon: [name], MD;  Location: [facility];  Service: General;  Laterality: N/A;  . RECTAL EXAM UNDER ANESTHESIA N/A 12/01/2014   Procedure: RECTAL EXAM UNDER ANESTHESIA;  Surgeon: [name], MD;  Location: [facility];  Service: General;  Laterality: N/A;  . TOTAL THYROIDECTOMY  10-13-2003  . TRANSTHORACIC ECHOCARDIOGRAM  07-11-2013   mild LVH,  ef 55%,  moderate AR,  mild MR and TR,  trivial PR    OB History    No data available       Home Medications    Prior to Admission medications   Medication Sig Start Date End Date Taking? Authorizing Provider  acetaminophen (TYLENOL) 500 MG tablet Take 500 mg by mouth every 6 (six) hours as needed for mild pain.     Historical Provider, MD  Adalimumab (HUMIRA) 40 MG/0.8ML PSKT Inject 40 mg into the skin every 14 (fourteen) days.    Historical Provider, MD  albuterol (PROVENTIL HFA;VENTOLIN HFA) 108 (90 BASE) MCG/ACT inhaler Inhale 2 puffs into the lungs every 6 (six) hours as needed for wheezing or shortness of breath.    Historical Provider, MD  azithromycin (ZITHROMAX) 500 MG tablet Take 1 tablet (500 mg total)  by mouth daily. 10/15/15   Costin Karlyne Greenspan, MD  calcitRIOL (ROCALTROL) 0.5 MCG capsule Take 0.5 mcg by mouth daily.    Historical Provider, MD  Cholecalciferol (VITAMIN D3) 2000 UNITS TABS Take 2,000 Units by mouth daily.  04/07/14   Historical Provider, MD  cyanocobalamin (,VITAMIN B-12,) 1000 MCG/ML injection Inject 1,000 mcg into the muscle every 30 (thirty) days. Last injection was 04/28/12    Historical Provider, MD  folic acid (FOLVITE) 1 MG tablet Take 1 mg by mouth daily.    Historical Provider, MD  guaiFENesin-dextromethorphan (ROBITUSSIN DM) 100-10 MG/5ML syrup Take 5 mLs by mouth every 4 (four) hours as needed for cough. 10/15/15   Costin Karlyne Greenspan, MD  levothyroxine (SYNTHROID, LEVOTHROID) 125 MCG tablet Take 125 mcg by mouth every morning.     Historical Provider, MD  potassium  chloride SA (K-DUR,KLOR-CON) 20 MEQ tablet Take 40 mEq by mouth 2 (two) times daily. 09/24/15   Historical Provider, MD  predniSONE (DELTASONE) 5 MG tablet Take 2 tablets (10 mg total) by mouth daily with breakfast. Then return to your daily 23m after this. 03/17/16 03/24/16  WZenovia Jarred DO  promethazine (PHENERGAN) 25 MG tablet Take 25 mg by mouth every 6 (six) hours as needed for nausea or vomiting.  04/19/14   Historical Provider, MD  QVAR 80 MCG/ACT inhaler Inhale 2 puffs into the lungs as needed (for wheezing).  08/27/13   Historical Provider, MD  sodium bicarbonate 650 MG tablet Take 1 tablet (650 mg total) by mouth 2 (two) times daily. 04/07/14   DSilver HugueninElgergawy, MD  SPIRIVA HANDIHALER 18 MCG inhalation capsule Place 18 mcg into inhaler and inhale as needed (for COPD).  09/07/13   Historical Provider, MD  traMADol (ULTRAM) 50 MG tablet Take 1 tablet (50 mg total) by mouth every 6 (six) hours as needed for severe pain. 03/17/16   WZenovia Jarred DO    Family History Family History  Problem Relation Age of Onset  . Hypertension Father   . Heart disease Father   . Heart attack Father   . Diverticulitis  Mother   . COPD Mother   . Hypertension Mother   . Heart disease Mother   . Heart attack Mother   . Colon cancer Brother   . Diabetes Brother   . Thyroid disease Brother     Social History Social History  Substance Use Topics  . Smoking status: Former Smoker    Packs/day: 0.25    Years: 15.00    Types: Cigarettes    Quit date: 11/27/2001  . Smokeless tobacco: Never Used  . Alcohol use No     Allergies   Mercaptopurine and Remicade [infliximab]   Review of Systems Review of Systems  Constitutional: Negative for chills and fever.  Respiratory: Negative for cough, chest tightness, shortness of breath and wheezing.   Cardiovascular: Positive for chest pain (chest tenderness). Negative for palpitations.  Gastrointestinal: Positive for nausea. Negative for abdominal pain, diarrhea and vomiting.  Genitourinary: Negative for dysuria, flank pain, frequency, hematuria and urgency.  Musculoskeletal: Positive for myalgias (left arm and left anterior chest). Negative for back pain and neck pain.  Skin: Negative for rash.  Neurological: Negative for syncope, weakness, light-headedness, numbness and headaches.  All other systems reviewed and are negative.    Physical Exam Updated Vital Signs BP 130/72   Pulse 81   Temp 98.5 F (36.9 C) (Oral)   Resp 17   SpO2 100%   Physical Exam  Constitutional: She is oriented to person, place, and time. She appears well-developed and well-nourished. No distress.  HENT:  Head: Normocephalic and atraumatic.  Nose: Nose normal.  Mouth/Throat: Oropharynx is clear and moist.  Eyes: Conjunctivae and EOM are normal. Pupils are equal, round, and reactive to light.  Neck: Neck supple.  Cardiovascular: Normal rate, regular rhythm, normal heart sounds and intact distal pulses.   Pulmonary/Chest: Effort normal and breath sounds normal. She exhibits tenderness.    Abdominal: Soft. She exhibits no distension. There is no tenderness.    Musculoskeletal: She exhibits tenderness. She exhibits no edema or deformity.       Left upper arm: She exhibits tenderness. She exhibits no bony tenderness, no swelling, no deformity and no laceration.       Arms: Neurological: She is alert and oriented to person, place,  and time. No cranial nerve deficit. Coordination normal.  Skin: Skin is warm and dry. No rash noted. She is not diaphoretic.  Nursing note and vitals reviewed.    ED Treatments / Results  Labs (all labs ordered are listed, but only abnormal results are displayed) Labs Reviewed  BASIC METABOLIC PANEL - Abnormal; Notable for the following:       Result Value   Potassium 3.1 (*)    Chloride 121 (*)    CO2 12 (*)    BUN 34 (*)    Creatinine, Ser 3.23 (*)    Calcium 8.1 (*)    GFR calc non Af Amer 14 (*)    GFR calc Af Amer 16 (*)    All other components within normal limits  CBC - Abnormal; Notable for the following:    RBC 3.78 (*)    Hemoglobin 11.6 (*)    RDW 15.9 (*)    Platelets 130 (*)    All other components within normal limits  I-STAT TROPOININ, ED    EKG  EKG Interpretation None       Radiology Dg Chest 2 View  Result Date: 03/17/2016 CLINICAL DATA:  Nausea. EXAM: CHEST  2 VIEW COMPARISON:  10/12/2015.  10/16/2014 FINDINGS: Mediastinum and hilar structures normal. Mild left base pleural parenchymal thickening noted consistent scarring small right pleural effusion cannot be excluded. No pneumothorax. Heart size stable. Surgical clips lower neck. No acute bony abnormality. Thoracolumbar spine scoliosis, degenerative change, osteopenia noted. IMPRESSION: Tiny right pleural effusion cannot be excluded. No other significant abnormalities identified. Electronically Signed   By: Marcello Moores  Register   On: 03/17/2016 13:06    Procedures Procedures (including critical care time)  Medications Ordered in ED Medications  traMADol (ULTRAM) tablet 50 mg (50 mg Oral Given 03/17/16 1545)     Initial  Impression / Assessment and Plan / ED Course  I have reviewed the triage vital signs and the nursing notes.  Pertinent labs & imaging results that were available during my care of the patient were reviewed by me and considered in my medical decision making (see chart for details).  Clinical Course  70 y.o. female presents with muscular soreness and tenderness in her left bicep and chest wall, along with nausea associated with her crohn's disease. EKG was done and showed NSR with no acute ST or Twave abnormalities suggestive of ischemia. CXR showed no significant abnormalities. Labs were drawn and returned showing negative troponin, no leukocytosis. BMP appears similar to prior with CKD. Given direct reproducibility of her sx, long duration, reassuring EKG and negative troponin, have a low suspicion for ACS as the etiology of her sx. She was recommended to increase her steroid dosing to 75m daily for a week and then return to her 532mdaily dosing, and rx'd tramadol for pain. She was recommended to follow up with her PCP in a few days for further evaluation of her sx. This was discussed with the patient at the bedside and she stated both understanding and agreement with this plan.     Final Clinical Impressions(s) / ED Diagnoses   Final diagnoses:  Chest wall pain  Strain of left biceps muscle, initial encounter  Nausea  Crohn's disease with complication, unspecified gastrointestinal tract location ([facility]   New Prescriptions Discharge Medication List as of 03/17/2016  3:37 PM    START taking these medications   Details  traMADol (ULTRAM) 50 MG tablet Take 1 tablet (50 mg total) by mouth every 6 (six)  hours as needed for severe pain., Starting Mon 03/17/2016, Print         Zenovia Jarred, DO 03/18/16 1244    Veryl Speak, MD 03/18/16 2320

## 2016-03-17 NOTE — Progress Notes (Signed)
EDCM called from [facility] to clarify prednisone order.  EDCM spoke to Rockne Menghini DO who confirms patient is to take prednisone 18m daily with breakfast for a week.  [facility] NMerrilee Seashoreat [place] and informed him of above.  No further EDCM needs at this time.

## 2016-03-17 NOTE — ED Triage Notes (Signed)
Nausea since last week and left arm pain since yesterday some sob this am

## 2016-03-20 ENCOUNTER — Other Ambulatory Visit (HOSPITAL_COMMUNITY): Payer: Self-pay | Admitting: Gastroenterology

## 2016-03-20 DIAGNOSIS — R110 Nausea: Secondary | ICD-10-CM

## 2016-03-20 DIAGNOSIS — K50019 Crohn's disease of small intestine with unspecified complications: Secondary | ICD-10-CM

## 2016-04-02 ENCOUNTER — Other Ambulatory Visit (HOSPITAL_COMMUNITY): Payer: Self-pay | Admitting: Gastroenterology

## 2016-04-02 ENCOUNTER — Ambulatory Visit (HOSPITAL_COMMUNITY)
Admission: RE | Admit: 2016-04-02 | Discharge: 2016-04-02 | Disposition: A | Discharge status: Home / Self Care | Payer: Medicare Other | Source: Ambulatory Visit | Attending: Gastroenterology | Admitting: Gastroenterology

## 2016-04-02 DIAGNOSIS — K50019 Crohn's disease of small intestine with unspecified complications: Secondary | ICD-10-CM

## 2016-04-02 DIAGNOSIS — K5000 Crohn's disease of small intestine without complications: Secondary | ICD-10-CM | POA: Diagnosis present

## 2016-04-02 DIAGNOSIS — Z9049 Acquired absence of other specified parts of digestive tract: Secondary | ICD-10-CM | POA: Diagnosis not present

## 2016-04-02 DIAGNOSIS — D649 Anemia, unspecified: Secondary | ICD-10-CM | POA: Diagnosis present

## 2016-04-02 DIAGNOSIS — R110 Nausea: Secondary | ICD-10-CM

## 2016-04-14 ENCOUNTER — Ambulatory Visit
Admission: RE | Admit: 2016-04-14 | Discharge: 2016-04-14 | Disposition: A | Discharge status: Home / Self Care | Payer: Medicare Other | Source: Ambulatory Visit | Attending: Internal Medicine | Admitting: Internal Medicine

## 2016-04-14 ENCOUNTER — Other Ambulatory Visit: Payer: Self-pay | Admitting: Internal Medicine

## 2016-04-14 DIAGNOSIS — J449 Chronic obstructive pulmonary disease, unspecified: Secondary | ICD-10-CM

## 2016-04-16 ENCOUNTER — Emergency Department (HOSPITAL_COMMUNITY)
Admission: EM | Admit: 2016-04-16 | Discharge: 2016-04-17 | Disposition: A | Discharge status: Home / Self Care | Payer: Medicare Other | Attending: Emergency Medicine | Admitting: Emergency Medicine

## 2016-04-16 ENCOUNTER — Encounter (HOSPITAL_COMMUNITY): Payer: Self-pay | Admitting: Emergency Medicine

## 2016-04-16 DIAGNOSIS — E039 Hypothyroidism, unspecified: Secondary | ICD-10-CM | POA: Insufficient documentation

## 2016-04-16 DIAGNOSIS — Z87891 Personal history of nicotine dependence: Secondary | ICD-10-CM | POA: Insufficient documentation

## 2016-04-16 DIAGNOSIS — R938 Abnormal findings on diagnostic imaging of other specified body structures: Secondary | ICD-10-CM | POA: Diagnosis not present

## 2016-04-16 DIAGNOSIS — N184 Chronic kidney disease, stage 4 (severe): Secondary | ICD-10-CM | POA: Diagnosis not present

## 2016-04-16 DIAGNOSIS — I130 Hypertensive heart and chronic kidney disease with heart failure and stage 1 through stage 4 chronic kidney disease, or unspecified chronic kidney disease: Secondary | ICD-10-CM | POA: Insufficient documentation

## 2016-04-16 DIAGNOSIS — J449 Chronic obstructive pulmonary disease, unspecified: Secondary | ICD-10-CM | POA: Insufficient documentation

## 2016-04-16 DIAGNOSIS — Z79899 Other long term (current) drug therapy: Secondary | ICD-10-CM | POA: Diagnosis not present

## 2016-04-16 DIAGNOSIS — R1110 Vomiting, unspecified: Secondary | ICD-10-CM | POA: Diagnosis present

## 2016-04-16 DIAGNOSIS — K5010 Crohn's disease of large intestine without complications: Secondary | ICD-10-CM | POA: Diagnosis not present

## 2016-04-16 DIAGNOSIS — I5042 Chronic combined systolic (congestive) and diastolic (congestive) heart failure: Secondary | ICD-10-CM | POA: Insufficient documentation

## 2016-04-16 LAB — COMPREHENSIVE METABOLIC PANEL
ALT: 15 U/L (ref 14–54)
AST: 18 U/L (ref 15–41)
Albumin: 3.5 g/dL (ref 3.5–5.0)
Alkaline Phosphatase: 128 U/L — ABNORMAL HIGH (ref 38–126)
Anion gap: 8 (ref 5–15)
BILIRUBIN TOTAL: 0.3 mg/dL (ref 0.3–1.2)
BUN: 39 mg/dL — ABNORMAL HIGH (ref 6–20)
CO2: 13 mmol/L — ABNORMAL LOW (ref 22–32)
Calcium: 8.5 mg/dL — ABNORMAL LOW (ref 8.9–10.3)
Chloride: 116 mmol/L — ABNORMAL HIGH (ref 101–111)
Creatinine, Ser: 4 mg/dL — ABNORMAL HIGH (ref 0.44–1.00)
GFR calc Af Amer: 12 mL/min — ABNORMAL LOW (ref >60)
GFR calc non Af Amer: 10 mL/min — ABNORMAL LOW (ref >60)
GLUCOSE: 104 mg/dL — AB (ref 65–99)
POTASSIUM: 4 mmol/L (ref 3.5–5.1)
Sodium: 137 mmol/L (ref 135–145)
TOTAL PROTEIN: 6.7 g/dL (ref 6.5–8.1)

## 2016-04-16 LAB — URINALYSIS, ROUTINE W REFLEX MICROSCOPIC
Bilirubin Urine: NEGATIVE
GLUCOSE, UA: NEGATIVE mg/dL
KETONES UR: NEGATIVE mg/dL
Leukocytes, UA: NEGATIVE
Nitrite: NEGATIVE
PH: 6 (ref 5.0–8.0)
Protein, ur: 100 mg/dL — AB
Specific Gravity, Urine: 1.015 (ref 1.005–1.030)

## 2016-04-16 LAB — CBC
HEMATOCRIT: 35.9 % — AB (ref 36.0–46.0)
Hemoglobin: 11.5 g/dL — ABNORMAL LOW (ref 12.0–15.0)
MCH: 31.1 pg (ref 26.0–34.0)
MCHC: 32 g/dL (ref 30.0–36.0)
MCV: 97 fL (ref 78.0–100.0)
Platelets: 120 10*3/uL — ABNORMAL LOW (ref 150–400)
RBC: 3.7 MIL/uL — ABNORMAL LOW (ref 3.87–5.11)
RDW: 16.2 % — ABNORMAL HIGH (ref 11.5–15.5)
WBC: 10.5 10*3/uL (ref 4.0–10.5)

## 2016-04-16 LAB — URINE MICROSCOPIC-ADD ON

## 2016-04-16 LAB — LIPASE, BLOOD: Lipase: 68 U/L — ABNORMAL HIGH (ref 11–51)

## 2016-04-16 MED ORDER — ONDANSETRON 4 MG PO TBDP
ORAL_TABLET | ORAL | Status: AC
  Filled 2016-04-16: qty 1

## 2016-04-16 MED ORDER — ONDANSETRON 4 MG PO TBDP
4.0000 mg | ORAL_TABLET | Freq: Once | ORAL | Status: AC | PRN
  Administered 2016-04-16: 4 mg via ORAL

## 2016-04-16 MED ORDER — ONDANSETRON HCL 4 MG/2ML IJ SOLN
4.0000 mg | Freq: Once | INTRAMUSCULAR | Status: AC
  Administered 2016-04-16: 4 mg via INTRAVENOUS
  Filled 2016-04-16: qty 2

## 2016-04-16 MED ORDER — SODIUM CHLORIDE 0.9 % IV BOLUS (SEPSIS)
1000.0000 mL | Freq: Once | INTRAVENOUS | Status: AC
  Administered 2016-04-16: 1000 mL via INTRAVENOUS

## 2016-04-16 MED ORDER — DICYCLOMINE HCL 10 MG PO CAPS
20.0000 mg | ORAL_CAPSULE | Freq: Once | ORAL | Status: AC
  Administered 2016-04-16: 20 mg via ORAL
  Filled 2016-04-16: qty 2

## 2016-04-16 NOTE — ED Triage Notes (Signed)
Pt presents to the ED with generalized body aches, nausea, vomiting, diarrhea that has persisted for 2 weeks. Went to PCP on Monday and they recommended her come to the hospital if felt worse. Taken off antibiotics due to kidney function. No fever. Pt has been on prednisone, feels weak. Swelling in bilateral lower extremity. Pt is not on dialysis

## 2016-04-16 NOTE — ED Provider Notes (Signed)
[facility] DEPT Provider Note   CSN: [id number] Arrival date & time: 04/16/16  2039  By signing my name below, I, Evelene Croon, attest that this documentation has been prepared under the direction and in the presence of Everlene Balls, MD . Electronically Signed: Evelene Croon, Scribe. 04/16/2016. 11:28 PM.  History   Chief Complaint Chief Complaint  Patient presents with  . Generalized Body Aches  . Emesis    The history is provided by the patient. No language interpreter was used.     HPI Comments:  Natasha [name] is a 70 y.o. female with an extensive PMHx including Crohn's disease, CKD, who presents to the Emergency Department with multiple complaints. She is complaining of congestion and productive cough which began 2 weeks ago. She states initially her sputum was greenish but is now clear. She also reports nausea, vomiting, diarrhea, and decreased appetite.  Pt was evaluated by PCP on 04/14/16 who suspected pt might be having a Crohn's flare up. She notes these symptoms are dissimilar from past crohn's exacerbations. Pt states the only thing her lab work showed was abnormal renal function. Pt notes she was placed on antibiotics and her steroid dose was increased. The antibiotic was stopped after the results of her  abnormal renal function was discovered. Her last  BM was this afternoon. Pt has a h/o abdominal surgery x 3.  She also notes swelling to her BLE.     Past Medical History:  Diagnosis Date  . Anal fistula   . Anemia in chronic kidney disease (CKD)   . Arthritis   . Borderline hypertension   . Bulging disc    L3-L4  . Chronic diarrhea    due to crohn's  . CKD (chronic kidney disease), stage IV ([place])    NEPHROLOGIST--  DR MATTINGLY  . Crohn's disease ([place])    chronic ileitis  . Emphysema/COPD ([place])   . GERD (gastroesophageal reflux disease)   . History of glaucoma   . History of pancreatitis    2008--  mercaptopurine  . History of small bowel obstruction      12-03-2010  due to crohn's ileitis  . Hypothyroidism, postsurgical    multinodule w/ hurthle cells  . Nephrolithiasis    bilateral  . Osteoporosis   . Perianal Crohn's disease ([place])   . Polyarthralgia   . Wears partial dentures    upper    Patient Active Problem List   Diagnosis Date Noted  . COPD exacerbation ([place]) 10/12/2015  . Malnutrition of moderate degree ([place]) 09/13/2014  . history of Left leg DVT 09/10/2014  . Hypotension 09/10/2014  . Hypoglycemia 09/10/2014  . Ischiorectal abscess 09/09/2014  . Increased anion gap metabolic acidosis   . Generalized abdominal pain   . Chronic combined systolic and diastolic CHF (congestive heart failure) ([place])   . Essential hypertension   . Acute on chronic renal failure ([place])   . Secondary hyperparathyroidism ([place])   . Other specified hypothyroidism   . Right shoulder pain   . Thrombocytopenia ([place])   . Severe protein-calorie malnutrition ([place])   . Thyroid activity decreased   . Emphysema of lung ([place])   . Nephrolithiasis 03/29/2014  . AKI (acute kidney injury) ([place]) 03/29/2014  . Spinal stenosis of lumbar region 11/01/2013  . Bilateral leg pain 09/28/2013  . Emphysema/COPD ([place])   . Dyspnea 09/20/2013  . Protein-calorie malnutrition, severe ([place]) 05/14/2013  . Salmonella enteritidis 06/11/2012  . Hypocalcemia 06/10/2012  . Hypothyroidism 06/10/2012  . Anemia 06/09/2012  .  Hypomagnesemia 06/09/2012  . CKD (chronic kidney disease) 06/09/2012  . CKD (chronic kidney disease) stage 4, GFR 15-29 ml/min (H[facility]) 06/09/2012  . Acute renal failure ([place]) 06/07/2012  . Dehydration 06/07/2012  . Crohn's disease s/p Colectomy, Terminal ilectomies.  Managed by [facility] 06/07/2012  . Metabolic acidosis 56/21/3086  . Hypokalemia 06/07/2012  . Diarrhea 06/07/2012  . UTI (lower urinary tract infection) 06/07/2012    Past Surgical History:  Procedure Laterality Date  . ABDOMINAL HYSTERECTOMY  1990   and  Appendectomy  . COLON  RESECTION  x3 --  1978,  1987, 1989   ILEAL RESECTION x2/   [place]  . COLONOSCOPY WITH PROPOFOL N/A 09/25/2014   Procedure: COLONOSCOPY WITH PROPOFOL;  Surgeon: Garlan Fair, MD;  Location: [facility];  Service: Endoscopy;  Laterality: N/A;  . ESOPHAGOGASTRODUODENOSCOPY  03/04/2012   Procedure: ESOPHAGOGASTRODUODENOSCOPY (EGD);  Surgeon: Garlan Fair, MD;  Location: [facility];  Service: Endoscopy;  Laterality: N/A;  . EXAMINATION UNDER ANESTHESIA N/A 09/12/2014   Procedure: EXAM UNDER ANESTHESIA;  Surgeon: Rolm Bookbinder, MD;  Location: [facility];  Service: General;  Laterality: N/A;  . FLEXIBLE SIGMOIDOSCOPY  03/04/2012   Procedure: FLEXIBLE SIGMOIDOSCOPY;  Surgeon: Garlan Fair, MD;  Location: [facility];  Service: Endoscopy;  Laterality: N/A;  . GLAUCOMA SURGERY Bilateral   . INCISION AND DRAINAGE PERIRECTAL ABSCESS N/A 09/12/2014   Procedure: IRRIGATION AND DEBRIDEMENT PERIRECTAL ABSCESS;  Surgeon: Rolm Bookbinder, MD;  Location: [facility];  Service: General;  Laterality: N/A;  . NEGATIVE SLEEP STUDY  2008  . PLACEMENT OF SETON N/A 12/01/2014   Procedure: PLACEMENT OF SETON;  Surgeon: [name], MD;  Location: [facility];  Service: General;  Laterality: N/A;  . RECTAL EXAM UNDER ANESTHESIA N/A 12/01/2014   Procedure: RECTAL EXAM UNDER ANESTHESIA;  Surgeon: [name], MD;  Location: [facility];  Service: General;  Laterality: N/A;  . TOTAL THYROIDECTOMY  10-13-2003  . TRANSTHORACIC ECHOCARDIOGRAM  07-11-2013   mild LVH,  ef 55%,  moderate AR,  mild MR and TR,  trivial PR    OB History    No data available       Home Medications    Prior to Admission medications   Medication Sig Start Date End Date Taking? Authorizing Provider  acetaminophen (TYLENOL) 500 MG tablet Take 500 mg by mouth every 6 (six) hours as needed for mild pain.     Historical Provider, MD  Adalimumab (HUMIRA) 40 MG/0.8ML PSKT Inject 40 mg into the  skin every 14 (fourteen) days.    Historical Provider, MD  albuterol (PROVENTIL HFA;VENTOLIN HFA) 108 (90 BASE) MCG/ACT inhaler Inhale 2 puffs into the lungs every 6 (six) hours as needed for wheezing or shortness of breath.    Historical Provider, MD  azithromycin (ZITHROMAX) 500 MG tablet Take 1 tablet (500 mg total) by mouth daily. 10/15/15   Costin Karlyne Greenspan, MD  calcitRIOL (ROCALTROL) 0.5 MCG capsule Take 0.5 mcg by mouth daily.    Historical Provider, MD  Cholecalciferol (VITAMIN D3) 2000 UNITS TABS Take 2,000 Units by mouth daily.  04/07/14   Historical Provider, MD  cyanocobalamin (,VITAMIN B-12,) 1000 MCG/ML injection Inject 1,000 mcg into the muscle every 30 (thirty) days. Last injection was 04/28/12    Historical Provider, MD  folic acid (FOLVITE) 1 MG tablet Take 1 mg by mouth daily.    Historical Provider, MD  guaiFENesin-dextromethorphan (ROBITUSSIN DM) 100-10 MG/5ML syrup Take 5 mLs by mouth every 4 (  four) hours as needed for cough. 10/15/15   Costin Karlyne Greenspan, MD  levothyroxine (SYNTHROID, LEVOTHROID) 125 MCG tablet Take 125 mcg by mouth every morning.     Historical Provider, MD  potassium chloride SA (K-DUR,KLOR-CON) 20 MEQ tablet Take 40 mEq by mouth 2 (two) times daily. 09/24/15   Historical Provider, MD  promethazine (PHENERGAN) 25 MG tablet Take 25 mg by mouth every 6 (six) hours as needed for nausea or vomiting.  04/19/14   Historical Provider, MD  QVAR 80 MCG/ACT inhaler Inhale 2 puffs into the lungs as needed (for wheezing).  08/27/13   Historical Provider, MD  sodium bicarbonate 650 MG tablet Take 1 tablet (650 mg total) by mouth 2 (two) times daily. 04/07/14   Silver Huguenin Elgergawy, MD  SPIRIVA HANDIHALER 18 MCG inhalation capsule Place 18 mcg into inhaler and inhale as needed (for COPD).  09/07/13   Historical Provider, MD  traMADol (ULTRAM) 50 MG tablet Take 1 tablet (50 mg total) by mouth every 6 (six) hours as needed for severe pain. 03/17/16   Zenovia Jarred, DO    Family  History Family History  Problem Relation Age of Onset  . Hypertension Father   . Heart disease Father   . Heart attack Father   . Diverticulitis Mother   . COPD Mother   . Hypertension Mother   . Heart disease Mother   . Heart attack Mother   . Colon cancer Brother   . Diabetes Brother   . Thyroid disease Brother     Social History Social History  Substance Use Topics  . Smoking status: Former Smoker    Packs/day: 0.25    Years: 15.00    Types: Cigarettes    Quit date: 11/27/2001  . Smokeless tobacco: Never Used  . Alcohol use No     Allergies   Mercaptopurine and Remicade [infliximab]   Review of Systems Review of Systems  10 systems reviewed and all are negative for acute change except as noted in the HPI.   Physical Exam Updated Vital Signs BP 102/70 (BP Location: Right Arm)   Pulse 86   Temp 98.2 F (36.8 C) (Oral)   Resp 18   Ht 5' 4"  (1.626 m)   Wt 102 lb (46.3 kg)   SpO2 100%   BMI 17.51 kg/m   Physical Exam  Constitutional: She is oriented to person, place, and time. She appears well-developed and well-nourished. No distress.  HENT:  Head: Normocephalic and atraumatic.  Nose: Nose normal.  Mouth/Throat: Oropharynx is clear and moist. No oropharyngeal exudate.  Eyes: Conjunctivae and EOM are normal. Pupils are equal, round, and reactive to light. No scleral icterus.  Neck: Normal range of motion. Neck supple. No JVD present. No tracheal deviation present. No thyromegaly present.  Cardiovascular: Normal rate, regular rhythm and normal heart sounds.  Exam reveals no gallop and no friction rub.   No murmur heard. Pulmonary/Chest: Effort normal and breath sounds normal. No respiratory distress. She has no wheezes. She exhibits no tenderness.  Abdominal: Soft. Bowel sounds are normal. She exhibits no distension and no mass. There is no tenderness. There is no rebound and no guarding.  Musculoskeletal: Normal range of motion. She exhibits edema (BLE).  She exhibits no tenderness.  Lymphadenopathy:    She has no cervical adenopathy.  Neurological: She is alert and oriented to person, place, and time. No cranial nerve deficit. She exhibits normal muscle tone.  Skin: Skin is warm and dry. No rash noted.  No erythema. No pallor.  Nursing note and vitals reviewed.    ED Treatments / Results  DIAGNOSTIC STUDIES:  Oxygen Saturation is 99% on RA, normal by my interpretation.    COORDINATION OF CARE:  11:13 PM Discussed treatment plan with pt at bedside and pt agreed to plan.  Labs (all labs ordered are listed, but only abnormal results are displayed) Labs Reviewed  LIPASE, BLOOD - Abnormal; Notable for the following:       Result Value   Lipase 68 (*)    All other components within normal limits  COMPREHENSIVE METABOLIC PANEL - Abnormal; Notable for the following:    Chloride 116 (*)    CO2 13 (*)    Glucose, Bld 104 (*)    BUN 39 (*)    Creatinine, Ser 4.00 (*)    Calcium 8.5 (*)    Alkaline Phosphatase 128 (*)    GFR calc non Af Amer 10 (*)    GFR calc Af Amer 12 (*)    All other components within normal limits  CBC - Abnormal; Notable for the following:    RBC 3.70 (*)    Hemoglobin 11.5 (*)    HCT 35.9 (*)    RDW 16.2 (*)    Platelets 120 (*)    All other components within normal limits  URINALYSIS, ROUTINE W REFLEX MICROSCOPIC (NOT AT [facility]) - Abnormal; Notable for the following:    Hgb urine dipstick SMALL (*)    Protein, ur 100 (*)    All other components within normal limits  URINE MICROSCOPIC-ADD ON - Abnormal; Notable for the following:    Squamous Epithelial / LPF 6-30 (*)    Bacteria, UA FEW (*)    All other components within normal limits    EKG  EKG Interpretation None       Radiology Ct Abdomen Pelvis Wo Contrast  Result Date: 04/17/2016 CLINICAL DATA:  Subacute onset of generalized weakness. Fever and productive cough. Nausea, vomiting and diarrhea. Known Crohn's disease. Initial encounter.  EXAM: CT CHEST, ABDOMEN AND PELVIS WITHOUT CONTRAST TECHNIQUE: Multidetector CT imaging of the chest, abdomen and pelvis was performed following the standard protocol without IV contrast. COMPARISON:  CT of the abdomen and pelvis performed 09/09/2014, and chest radiograph performed 04/14/2016 FINDINGS: CT CHEST FINDINGS Cardiovascular: The heart remains normal in size. Minimal calcification is noted about the proximal ascending thoracic aorta. The aortic arch is unremarkable in appearance. The great vessels are within normal limits. Mediastinum/Nodes: Trace pericardial fluid remains within normal limits. No mediastinal lymphadenopathy is seen. The patient is status post thyroidectomy. No axillary lymphadenopathy is appreciated. Lungs/Pleura: Minimal bilateral scarring is noted. Bilateral emphysema is seen. No pleural effusion or pneumothorax is seen. No masses are identified. Musculoskeletal: No acute osseous abnormalities are identified. The visualized musculature is unremarkable in appearance. CT ABDOMEN PELVIS FINDINGS Hepatobiliary: The liver is unremarkable in appearance. The patient is status post cholecystectomy, with clips noted at the gallbladder fossa. The common bile duct remains normal in caliber. Pancreas: The pancreas is within normal limits. Spleen: The spleen is unremarkable in appearance. Adrenals/Urinary Tract: The adrenal glands are unremarkable in appearance. Scattered nonobstructing bilateral renal stones are seen, measuring up to 7 mm in size. Small bilateral renal cysts are noted. No perinephric stranding is appreciated. There is no evidence of hydronephrosis. No obstructing ureteral stones are identified. Stomach/Bowel: A small diverticulum is noted at the fundus of the stomach. The stomach is otherwise grossly unremarkable. There is slight wall thickening about  the distal ileum and transverse colon at the level of the patient's ileocolic anastomosis. The remainder of the small and large  bowel is grossly unremarkable. Vascular/Lymphatic: Scattered calcification is seen along the abdominal aorta and its branches. The abdominal aorta is otherwise grossly unremarkable. The inferior vena cava is grossly unremarkable. No retroperitoneal lymphadenopathy is seen. No pelvic sidewall lymphadenopathy is identified. Reproductive: The bladder is mildly distended and grossly unremarkable. The patient is status post hysterectomy. No suspicious adnexal masses are seen. The right ovary is unremarkable in appearance. Other: No additional soft tissue abnormalities are seen. Musculoskeletal: No acute osseous abnormalities are identified. The visualized musculature is unremarkable in appearance. IMPRESSION: 1. Slight wall thickening about the distal ileum and transverse colon at the level of the patient's ileocolic anastomosis, which may reflect slight exacerbation of the patient's Crohn's disease. Remaining bowel otherwise unremarkable in appearance. 2. Bilateral emphysema noted.  Minimal bilateral scarring seen. 3. Small diverticulum noted at the fundus of the stomach. 4. Scattered nonobstructing bilateral renal stones, measuring up to 7 mm in size. Small bilateral renal cysts seen. 5. Scattered aortic atherosclerosis. Electronically Signed   By: Garald Balding M.D.   On: 04/17/2016 03:20   Ct Chest Wo Contrast  Result Date: 04/17/2016 CLINICAL DATA:  Subacute onset of generalized weakness. Fever and productive cough. Nausea, vomiting and diarrhea. Known Crohn's disease. Initial encounter. EXAM: CT CHEST, ABDOMEN AND PELVIS WITHOUT CONTRAST TECHNIQUE: Multidetector CT imaging of the chest, abdomen and pelvis was performed following the standard protocol without IV contrast. COMPARISON:  CT of the abdomen and pelvis performed 09/09/2014, and chest radiograph performed 04/14/2016 FINDINGS: CT CHEST FINDINGS Cardiovascular: The heart remains normal in size. Minimal calcification is noted about the proximal  ascending thoracic aorta. The aortic arch is unremarkable in appearance. The great vessels are within normal limits. Mediastinum/Nodes: Trace pericardial fluid remains within normal limits. No mediastinal lymphadenopathy is seen. The patient is status post thyroidectomy. No axillary lymphadenopathy is appreciated. Lungs/Pleura: Minimal bilateral scarring is noted. Bilateral emphysema is seen. No pleural effusion or pneumothorax is seen. No masses are identified. Musculoskeletal: No acute osseous abnormalities are identified. The visualized musculature is unremarkable in appearance. CT ABDOMEN PELVIS FINDINGS Hepatobiliary: The liver is unremarkable in appearance. The patient is status post cholecystectomy, with clips noted at the gallbladder fossa. The common bile duct remains normal in caliber. Pancreas: The pancreas is within normal limits. Spleen: The spleen is unremarkable in appearance. Adrenals/Urinary Tract: The adrenal glands are unremarkable in appearance. Scattered nonobstructing bilateral renal stones are seen, measuring up to 7 mm in size. Small bilateral renal cysts are noted. No perinephric stranding is appreciated. There is no evidence of hydronephrosis. No obstructing ureteral stones are identified. Stomach/Bowel: A small diverticulum is noted at the fundus of the stomach. The stomach is otherwise grossly unremarkable. There is slight wall thickening about the distal ileum and transverse colon at the level of the patient's ileocolic anastomosis. The remainder of the small and large bowel is grossly unremarkable. Vascular/Lymphatic: Scattered calcification is seen along the abdominal aorta and its branches. The abdominal aorta is otherwise grossly unremarkable. The inferior vena cava is grossly unremarkable. No retroperitoneal lymphadenopathy is seen. No pelvic sidewall lymphadenopathy is identified. Reproductive: The bladder is mildly distended and grossly unremarkable. The patient is status post  hysterectomy. No suspicious adnexal masses are seen. The right ovary is unremarkable in appearance. Other: No additional soft tissue abnormalities are seen. Musculoskeletal: No acute osseous abnormalities are identified. The visualized musculature is unremarkable in  appearance. IMPRESSION: 1. Slight wall thickening about the distal ileum and transverse colon at the level of the patient's ileocolic anastomosis, which may reflect slight exacerbation of the patient's Crohn's disease. Remaining bowel otherwise unremarkable in appearance. 2. Bilateral emphysema noted.  Minimal bilateral scarring seen. 3. Small diverticulum noted at the fundus of the stomach. 4. Scattered nonobstructing bilateral renal stones, measuring up to 7 mm in size. Small bilateral renal cysts seen. 5. Scattered aortic atherosclerosis. Electronically Signed   By: Garald Balding M.D.   On: 04/17/2016 03:20    Procedures Procedures (including critical care time)  Medications Ordered in ED Medications  iopamidol (ISOVUE-300) 61 % injection (not administered)  ondansetron (ZOFRAN-ODT) disintegrating tablet 4 mg (4 mg Oral Given 04/16/16 2057)  sodium chloride 0.9 % bolus 1,000 mL (0 mLs Intravenous Stopped 04/17/16 0020)  ondansetron (ZOFRAN) injection 4 mg (4 mg Intravenous Given 04/16/16 2344)  dicyclomine (BENTYL) capsule 20 mg (20 mg Oral Given 04/16/16 2344)     Initial Impression / Assessment and Plan / ED Course  I have reviewed the triage vital signs and the nursing notes.  Pertinent labs & imaging results that were available during my care of the patient were reviewed by me and considered in my medical decision making (see chart for details).  Clinical Course     Patient presents to the emergency department for chest and abdominal pain, as well as vomiting and diarrhea. She has gone through a course of antibiotics without any relief. Labs are unremarkable, CT scan reveals possible exacerbation of Crohn's disease. Will  ensure patient is on high-dose prednisone for treatment and advised her primary care follow-up. She was given Bentyl, Zofran, and IV fluids for relief. She appears well in no acute distress, vital signs were within her normal limits and she is safe for discharge.  Final Clinical Impressions(s) / ED Diagnoses   Final diagnoses:  None    New Prescriptions New Prescriptions   No medications on file      I personally performed the services described in this documentation, which was scribed in my presence. The recorded information has been reviewed and is accurate.       Everlene Balls, MD 04/17/16 [phone]

## 2016-04-17 ENCOUNTER — Emergency Department (HOSPITAL_COMMUNITY): Payer: Medicare Other

## 2016-04-17 ENCOUNTER — Encounter (HOSPITAL_COMMUNITY): Payer: Self-pay | Admitting: *Deleted

## 2016-04-17 MED ORDER — IOPAMIDOL (ISOVUE-300) INJECTION 61%
INTRAVENOUS | Status: AC
  Filled 2016-04-17: qty 30

## 2016-04-17 MED ORDER — PREDNISONE 20 MG PO TABS: 60.0000 mg | ORAL_TABLET | Freq: Every day | ORAL | 0 refills | Status: DC

## 2016-04-17 NOTE — ED Notes (Signed)
Patient transported to CT 

## 2016-05-01 ENCOUNTER — Ambulatory Visit (INDEPENDENT_AMBULATORY_CARE_PROVIDER_SITE_OTHER): Payer: Medicare Other | Admitting: Pulmonary Disease

## 2016-05-01 ENCOUNTER — Encounter: Payer: Self-pay | Admitting: Pulmonary Disease

## 2016-05-01 VITALS — BP 102/62 | HR 79 | Ht 64.0 in | Wt 102.0 lb

## 2016-05-01 DIAGNOSIS — J432 Centrilobular emphysema: Secondary | ICD-10-CM

## 2016-05-01 MED ORDER — FLUTICASONE-UMECLIDIN-VILANT 100-62.5-25 MCG/INH IN AEPB: 1.0000 | INHALATION_SPRAY | Freq: Every day | RESPIRATORY_TRACT | 5 refills | Status: DC

## 2016-05-01 NOTE — Patient Instructions (Addendum)
Stop spiriva and qvar  Trelegy one puff daily, and rinse mouth after each use  Will arrange for pulmonary rehab referral   Follow up in 2 months

## 2016-05-01 NOTE — Progress Notes (Signed)
Past surgical history She  has a past surgical history that includes Colon resection (x3 --  1978,  1987, 1989); Esophagogastroduodenoscopy (03/04/2012); Flexible sigmoidoscopy (03/04/2012); Examination under anesthesia (N/A, 09/12/2014); Incision and drainage perirectal abscess (N/A, 09/12/2014); Colonoscopy with propofol (N/A, 09/25/2014); Total thyroidectomy (10-13-2003); transthoracic echocardiogram (07-11-2013); Glaucoma surgery (Bilateral); NEGATIVE SLEEP STUDY (2008); Abdominal hysterectomy (1990); Rectal exam under anesthesia (N/A, 12/01/2014); and Placement of seton (N/A, 12/01/2014).  Family history Her family history includes COPD in her mother; Colon cancer in her brother; Diabetes in her brother; Diverticulitis in her mother; Heart attack in her father and mother; Heart disease in her father and mother; Hypertension in her father and mother; Thyroid disease in her brother.  Social history She  reports that she quit smoking about 14 years ago. Her smoking use included Cigarettes. She has a 3.75 pack-year smoking history. She has never used smokeless tobacco. She reports that she does not drink alcohol or use drugs.  Allergies  Allergen Reactions  . Mercaptopurine Other (See Comments)    Caused pancreatitis  . Remicade [Infliximab] Other (See Comments)    "joint pain"    Current Outpatient Prescriptions on File Prior to Visit  Medication Sig  . acetaminophen (TYLENOL) 500 MG tablet Take 500 mg by mouth every 6 (six) hours as needed for mild pain.   . Adalimumab (HUMIRA) 40 MG/0.8ML PSKT Inject 40 mg into the skin every 14 (fourteen) days.  [name] albuterol (PROVENTIL HFA;VENTOLIN HFA) 108 (90 BASE) MCG/ACT inhaler Inhale 2 puffs into the lungs every 6 (six) hours as needed for wheezing or shortness of breath.  . calcitRIOL (ROCALTROL) 0.5 MCG capsule Take 0.5 mcg by mouth daily.  . Cholecalciferol (VITAMIN D3) 2000 UNITS TABS Take 2,000 Units by mouth daily.   . cyanocobalamin (,VITAMIN B-12,)  1000 MCG/ML injection Inject 1,000 mcg into the muscle every 30 (thirty) days. Last injection was 04/28/12  . folic acid (FOLVITE) 1 MG tablet Take 1 mg by mouth daily.  [name] guaiFENesin-dextromethorphan (ROBITUSSIN DM) 100-10 MG/5ML syrup Take 5 mLs by mouth every 4 (four) hours as needed for cough.  . levothyroxine (SYNTHROID, LEVOTHROID) 125 MCG tablet Take 125 mcg by mouth every morning.   . potassium chloride SA (K-DUR,KLOR-CON) 20 MEQ tablet Take 40 mEq by mouth 3 (three) times daily.   . predniSONE (DELTASONE) 20 MG tablet Take 3 tablets (60 mg total) by mouth daily. (Patient taking differently: Take 40 mg by mouth daily. Tapering down)  . promethazine (PHENERGAN) 25 MG tablet Take 25 mg by mouth every 6 (six) hours as needed for nausea or vomiting.   . sodium bicarbonate 650 MG tablet Take 1 tablet (650 mg total) by mouth 2 (two) times daily. (Patient taking differently: Take 1,300 mg by mouth 3 (three) times daily. )  . traMADol (ULTRAM) 50 MG tablet Take 1 tablet (50 mg total) by mouth every 6 (six) hours as needed for severe pain.   No current facility-administered medications on file prior to visit.     Chief Complaint  Patient presents with  . PULMONARY CONSULT    Referred by Dr Fara Olden for COPD. Recent cxr and CT 03/2016.     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  Vital signs BP 102/62 (BP Location: Left Arm, Cuff Size: Normal)   Pulse 79   Ht 5' 4"  (1.626 m)   Wt 102 lb (46.3 kg)   SpO2 95%   BMI 17.51 kg/m   History of present illness Natasha [name] is a 70 y.o. female former smoker with COPD/emphysema.  She was told in 2015 that she had COPD/emphysema.  She was being assessed for dyspnea.  She had PFTs as that time.    She quit smoking, but several of her family members continue to smoke.  She gets cough with clear sputum.  She occasionally has wheezing.  She has not had fever, or hemoptysis.  She isn't able to do much activity.  She gets scared about what could happen to her breathing if she does activity, and therefore tries to avoid doing things.  She has ankle edema.  She denies history of pneumonia or exposure to tuberculosis.  She is followed by [facility] for Crohn's disease and is on humira and prednisone.  She is followed by nephrology for CKD IV, and there is concern she might progress to ESRD.   Physical exam  General - thin ENT - No sinus tenderness, no oral exudate, no LAN, no thyromegaly, TM clear, pupils equal/reactive Cardiac - s1s2 regular, no murmur, pulses symmetric Chest - decreased breath sounds, no wheeze Back - No focal tenderness Abd - Soft, non-tender, no organomegaly, + bowel sounds Ext - minimal ankle edema Neuro - Normal strength, cranial nerves intact Skin - No rashes Psych - Normal mood, and behavior   CMP Latest Ref Rng & Units 04/16/2016 03/17/2016 10/14/2015  Glucose 65 - 99 mg/dL 104(H) 76 84  BUN 6 - 20 mg/dL 39(H) 34(H) 40(H)  Creatinine 0.44 - 1.00 mg/dL 4.00(H) 3.23(H) 2.99(H)  Sodium 135 - 145 mmol/L 137 141 137  Potassium 3.5 - 5.1 mmol/L  4.0 3.1(L) 4.1  Chloride 101 - 111 mmol/L 116(H) 121(H) 119(H)  CO2 22 - 32 mmol/L 13(L) 12(L) 11(L)  Calcium 8.9 - 10.3 mg/dL 8.5(L) 8.1(L) 8.2(L)  Total Protein 6.5 - 8.1 g/dL 6.7 - -  Total Bilirubin 0.3 - 1.2 mg/dL 0.3 - -  Alkaline Phos 38 - 126 U/L 128(H) - -  AST 15 - 41 U/L 18 - -  ALT 14 - 54 U/L 15 - -     CBC Latest Ref Rng & Units 04/16/2016 03/17/2016 10/13/2015  WBC 4.0 - 10.5 K/uL 10.5 9.0 7.5  Hemoglobin 12.0 - 15.0 g/dL 11.5(L) 11.6(L) 11.4(L)  Hematocrit 36.0 - 46.0 % 35.9(L) 36.6 36.8  Platelets 150 - 400 K/uL 120(L) 130(L) 131(L)    Discussion She has extensive prior history of smoking.  She has previous CT chest and PFT consistent with severe COPD with emphysema.  She has gradual progression of dyspnea which limits her activity.  Her oxygen level on ambulation was maintained on room air today.  Assessment/plan  Severe COPD with emphysema. - will change her from spiriva/qvar to trelegy - will arrange for referral to pulmonary rehab - completed handicap parking  form for her  Crohn's colitis. - followed by Dr. Wynetta Emery with [facility]  Severe protein calorie malnutrition. - per primary team  Stage IV CKD. - f/u with Dr. Mercy Moore with nephrology   Patient Instructions  Stop spiriva and qvar  Trelegy one puff daily, and rinse mouth after each use  Will arrange for pulmonary rehab referral   Follow up in 2 months    Chesley Mires, MD [place] [facility] Pager:  [phone] 05/01/2016, 11:09 AM

## 2016-05-01 NOTE — Progress Notes (Signed)
   Subjective:    Patient ID: Natasha [name], female    DOB: 1945/06/25, 69 y.o.   MRN: [id number]  HPI    Review of Systems  Constitutional: Negative for fever and unexpected weight change.  HENT: Negative for congestion, dental problem, ear pain, nosebleeds, postnasal drip, rhinorrhea, sinus pressure, sneezing, sore throat and trouble swallowing.   Eyes: Negative for redness and itching.  Respiratory: Positive for cough and shortness of breath. Negative for chest tightness and wheezing.        Chest congestion  Cardiovascular: Positive for leg swelling. Negative for palpitations.  Gastrointestinal: Positive for nausea and vomiting.  Genitourinary: Negative for dysuria.  Musculoskeletal: Negative for joint swelling.  Skin: Negative for rash.  Neurological: Negative for headaches.  Hematological: Does not bruise/bleed easily.  Psychiatric/Behavioral: Negative for dysphoric mood. The patient is not nervous/anxious.        Objective:   Physical Exam        Assessment & Plan:

## 2016-06-07 ENCOUNTER — Emergency Department (HOSPITAL_COMMUNITY)
Admission: EM | Admit: 2016-06-07 | Discharge: 2016-06-07 | Disposition: A | Discharge status: Home / Self Care | Payer: Medicare Other | Attending: Emergency Medicine | Admitting: Emergency Medicine

## 2016-06-07 ENCOUNTER — Encounter (HOSPITAL_COMMUNITY): Payer: Self-pay | Admitting: Emergency Medicine

## 2016-06-07 DIAGNOSIS — I130 Hypertensive heart and chronic kidney disease with heart failure and stage 1 through stage 4 chronic kidney disease, or unspecified chronic kidney disease: Secondary | ICD-10-CM | POA: Diagnosis not present

## 2016-06-07 DIAGNOSIS — J019 Acute sinusitis, unspecified: Secondary | ICD-10-CM | POA: Diagnosis not present

## 2016-06-07 DIAGNOSIS — E039 Hypothyroidism, unspecified: Secondary | ICD-10-CM | POA: Diagnosis not present

## 2016-06-07 DIAGNOSIS — I5042 Chronic combined systolic (congestive) and diastolic (congestive) heart failure: Secondary | ICD-10-CM | POA: Diagnosis not present

## 2016-06-07 DIAGNOSIS — Z79899 Other long term (current) drug therapy: Secondary | ICD-10-CM | POA: Diagnosis not present

## 2016-06-07 DIAGNOSIS — Z87891 Personal history of nicotine dependence: Secondary | ICD-10-CM | POA: Insufficient documentation

## 2016-06-07 DIAGNOSIS — R0981 Nasal congestion: Secondary | ICD-10-CM | POA: Diagnosis present

## 2016-06-07 DIAGNOSIS — J449 Chronic obstructive pulmonary disease, unspecified: Secondary | ICD-10-CM | POA: Insufficient documentation

## 2016-06-07 DIAGNOSIS — N184 Chronic kidney disease, stage 4 (severe): Secondary | ICD-10-CM | POA: Insufficient documentation

## 2016-06-07 MED ORDER — AMOXICILLIN 500 MG PO CAPS: 500.0000 mg | ORAL_CAPSULE | Freq: Three times a day (TID) | ORAL | 0 refills | Status: DC

## 2016-06-07 NOTE — ED Provider Notes (Signed)
[facility] DEPT Provider Note   CSN: [id number] Arrival date & time: 06/07/16  1513     History   Chief Complaint Chief Complaint  Patient presents with  . Cough  . Nasal Congestion    HPI Natasha [name] is a 71 y.o. female.  She presents for evaluation of rhinorrhea, green in color, facial pain, nasal congestion, postnasal drip, and congestive cough for several days. She denies fever, weakness, dizziness, vomiting or diarrhea. She came by private vehicle. She took a flu immunization this year. There are no other known modifying factors.  HPI  Past Medical History:  Diagnosis Date  . Anal fistula   . Anemia in chronic kidney disease (CKD)   . Arthritis   . Borderline hypertension   . Bulging disc    L3-L4  . Chronic diarrhea    due to crohn's  . CKD (chronic kidney disease), stage IV ([place])    NEPHROLOGIST--  DR MATTINGLY  . Crohn's disease ([place])    chronic ileitis  . Emphysema/COPD ([place])   . GERD (gastroesophageal reflux disease)   . History of glaucoma   . History of pancreatitis    2008--  mercaptopurine  . History of small bowel obstruction    12-03-2010  due to crohn's ileitis  . Hypothyroidism, postsurgical    multinodule w/ hurthle cells  . Nephrolithiasis    bilateral  . Osteoporosis   . Perianal Crohn's disease ([place])   . Polyarthralgia   . Wears partial dentures    upper    Patient Active Problem List   Diagnosis Date Noted  . COPD exacerbation ([place]) 10/12/2015  . Malnutrition of moderate degree ([place]) 09/13/2014  . history of Left leg DVT 09/10/2014  . Hypotension 09/10/2014  . Hypoglycemia 09/10/2014  . Ischiorectal abscess 09/09/2014  . Increased anion gap metabolic acidosis   . Generalized abdominal pain   . Chronic combined systolic and diastolic CHF (congestive heart failure) ([place])   . Essential hypertension   . Acute on chronic renal failure ([place])   . Secondary hyperparathyroidism ([place])   . Other specified hypothyroidism   .  Right shoulder pain   . Thrombocytopenia ([place])   . Severe protein-calorie malnutrition ([place])   . Thyroid activity decreased   . Emphysema of lung ([place])   . Nephrolithiasis 03/29/2014  . AKI (acute kidney injury) ([place]) 03/29/2014  . Spinal stenosis of lumbar region 11/01/2013  . Bilateral leg pain 09/28/2013  . Emphysema/COPD ([place])   . Dyspnea 09/20/2013  . Protein-calorie malnutrition, severe ([place]) 05/14/2013  . Salmonella enteritidis 06/11/2012  . Hypocalcemia 06/10/2012  . Hypothyroidism 06/10/2012  . Anemia 06/09/2012  . Hypomagnesemia 06/09/2012  . CKD (chronic kidney disease) 06/09/2012  . CKD (chronic kidney disease) stage 4, GFR 15-29 ml/min (H[facility]) 06/09/2012  . Acute renal failure ([place]) 06/07/2012  . Dehydration 06/07/2012  . Crohn's disease s/p Colectomy, Terminal ilectomies.  Managed by [facility] 06/07/2012  . Metabolic acidosis 95/63/8756  . Hypokalemia 06/07/2012  . Diarrhea 06/07/2012  . UTI (lower urinary tract infection) 06/07/2012    Past Surgical History:  Procedure Laterality Date  . ABDOMINAL HYSTERECTOMY  1990   and  Appendectomy  . COLON RESECTION  x3 --  1978,  1987, 1989   ILEAL RESECTION x2/   [place]  . COLONOSCOPY WITH PROPOFOL N/A 09/25/2014   Procedure: COLONOSCOPY WITH PROPOFOL;  Surgeon: Garlan Fair, MD;  Location: [facility];  Service: Endoscopy;  Laterality: N/A;  . ESOPHAGOGASTRODUODENOSCOPY  03/04/2012  Procedure: ESOPHAGOGASTRODUODENOSCOPY (EGD);  Surgeon: Garlan Fair, MD;  Location: [facility];  Service: Endoscopy;  Laterality: N/A;  . EXAMINATION UNDER ANESTHESIA N/A 09/12/2014   Procedure: EXAM UNDER ANESTHESIA;  Surgeon: Rolm Bookbinder, MD;  Location: [facility];  Service: General;  Laterality: N/A;  . FLEXIBLE SIGMOIDOSCOPY  03/04/2012   Procedure: FLEXIBLE SIGMOIDOSCOPY;  Surgeon: Garlan Fair, MD;  Location: [facility];  Service: Endoscopy;  Laterality: N/A;  . GLAUCOMA SURGERY Bilateral   . INCISION AND  DRAINAGE PERIRECTAL ABSCESS N/A 09/12/2014   Procedure: IRRIGATION AND DEBRIDEMENT PERIRECTAL ABSCESS;  Surgeon: Rolm Bookbinder, MD;  Location: [facility];  Service: General;  Laterality: N/A;  . NEGATIVE SLEEP STUDY  2008  . PLACEMENT OF SETON N/A 12/01/2014   Procedure: PLACEMENT OF SETON;  Surgeon: [name], MD;  Location: [facility];  Service: General;  Laterality: N/A;  . RECTAL EXAM UNDER ANESTHESIA N/A 12/01/2014   Procedure: RECTAL EXAM UNDER ANESTHESIA;  Surgeon: [name], MD;  Location: [facility];  Service: General;  Laterality: N/A;  . TOTAL THYROIDECTOMY  10-13-2003  . TRANSTHORACIC ECHOCARDIOGRAM  07-11-2013   mild LVH,  ef 55%,  moderate AR,  mild MR and TR,  trivial PR    OB History    No data available       Home Medications    Prior to Admission medications   Medication Sig Start Date End Date Taking? Authorizing Provider  acetaminophen (TYLENOL) 500 MG tablet Take 500 mg by mouth every 6 (six) hours as needed for mild pain.     Historical Provider, MD  Adalimumab (HUMIRA) 40 MG/0.8ML PSKT Inject 40 mg into the skin every 14 (fourteen) days.    Historical Provider, MD  albuterol (PROVENTIL HFA;VENTOLIN HFA) 108 (90 BASE) MCG/ACT inhaler Inhale 2 puffs into the lungs every 6 (six) hours as needed for wheezing or shortness of breath.    Historical Provider, MD  amoxicillin (AMOXIL) 500 MG capsule Take 1 capsule (500 mg total) by mouth 3 (three) times daily. 06/07/16   Daleen Bo, MD  calcitRIOL (ROCALTROL) 0.5 MCG capsule Take 0.5 mcg by mouth daily.    Historical Provider, MD  Cholecalciferol (VITAMIN D3) 2000 UNITS TABS Take 2,000 Units by mouth daily.  04/07/14   Historical Provider, MD  cyanocobalamin (,VITAMIN B-12,) 1000 MCG/ML injection Inject 1,000 mcg into the muscle every 30 (thirty) days. Last injection was 04/28/12    Historical Provider, MD  Fluticasone-Umeclidin-Vilant (TRELEGY ELLIPTA) 100-62.5-25 MCG/INH AEPB Inhale 1  puff into the lungs daily. 05/01/16   Chesley Mires, MD  folic acid (FOLVITE) 1 MG tablet Take 1 mg by mouth daily.    Historical Provider, MD  guaiFENesin-dextromethorphan (ROBITUSSIN DM) 100-10 MG/5ML syrup Take 5 mLs by mouth every 4 (four) hours as needed for cough. 10/15/15   Costin Karlyne Greenspan, MD  levothyroxine (SYNTHROID, LEVOTHROID) 125 MCG tablet Take 125 mcg by mouth every morning.     Historical Provider, MD  potassium chloride SA (K-DUR,KLOR-CON) 20 MEQ tablet Take 40 mEq by mouth 3 (three) times daily.  09/24/15   Historical Provider, MD  predniSONE (DELTASONE) 20 MG tablet Take 3 tablets (60 mg total) by mouth daily. Patient taking differently: Take 40 mg by mouth daily. Tapering down 04/17/16   Everlene Balls, MD  promethazine (PHENERGAN) 25 MG tablet Take 25 mg by mouth every 6 (six) hours as needed for nausea or vomiting.  04/19/14   Historical Provider, MD  sodium bicarbonate 650 MG tablet Take  1 tablet (650 mg total) by mouth 2 (two) times daily. Patient taking differently: Take 1,300 mg by mouth 3 (three) times daily.  04/07/14   Albertine Patricia, MD  traMADol (ULTRAM) 50 MG tablet Take 1 tablet (50 mg total) by mouth every 6 (six) hours as needed for severe pain. 03/17/16   Zenovia Jarred, DO    Family History Family History  Problem Relation Age of Onset  . Hypertension Father   . Heart disease Father   . Heart attack Father   . Diverticulitis Mother   . COPD Mother   . Hypertension Mother   . Heart disease Mother   . Heart attack Mother   . Colon cancer Brother   . Diabetes Brother   . Thyroid disease Brother     Social History Social History  Substance Use Topics  . Smoking status: Former Smoker    Packs/day: 0.25    Years: 15.00    Types: Cigarettes    Quit date: 11/27/2001  . Smokeless tobacco: Never Used  . Alcohol use No     Allergies   Mercaptopurine and Remicade [infliximab]   Review of Systems Review of Systems  All other systems reviewed and are  negative.    Physical Exam Updated Vital Signs BP 141/86 (BP Location: Left Arm)   Pulse 96   Temp 98.2 F (36.8 C) (Oral)   Resp 18   Ht 5' 4"  (1.626 m)   Wt 103 lb (46.7 kg)   SpO2 98%   BMI 17.68 kg/m   Physical Exam  Constitutional: She is oriented to person, place, and time. She appears well-developed.  Elderly, frail  HENT:  Head: Normocephalic and atraumatic.  Tenderness with percussion over frontal maxillary sinuses bilaterally. Moist mucous membranes. No overt nasal drainage.  Eyes: Conjunctivae and EOM are normal. Pupils are equal, round, and reactive to light.  Neck: Normal range of motion and phonation normal. Neck supple.  Cardiovascular: Normal rate and regular rhythm.   Pulmonary/Chest: Effort normal and breath sounds normal. No respiratory distress. She has no wheezes. She exhibits no tenderness.  Abdominal: Soft. She exhibits no distension. There is no tenderness. There is no guarding.  Musculoskeletal: Normal range of motion.  Neurological: She is alert and oriented to person, place, and time. She exhibits normal muscle tone.  Skin: Skin is warm and dry.  Psychiatric: She has a normal mood and affect. Her behavior is normal. Judgment and thought content normal.  Nursing note and vitals reviewed.    ED Treatments / Results  Labs (all labs ordered are listed, but only abnormal results are displayed) Labs Reviewed - No data to display  EKG  EKG Interpretation None       Radiology No results found.  Procedures Procedures (including critical care time)  Medications Ordered in ED Medications - No data to display   Initial Impression / Assessment and Plan / ED Course  I have reviewed the triage vital signs and the nursing notes.  Pertinent labs & imaging results that were available during my care of the patient were reviewed by me and considered in my medical decision making (see chart for details).  Clinical Course    Medications - No data  to display  Patient Vitals for the past 24 hrs:  BP Temp Temp src Pulse Resp SpO2 Height Weight  06/07/16 1528 141/86 98.2 F (36.8 C) Oral 96 18 98 % - -  06/07/16 1527 - - - - - - 5'  4" (1.626 m) 103 lb (46.7 kg)    5:02 PM Reevaluation with update and discussion. After initial assessment and treatment, an updated evaluation reveals no change in clinical status. Findings discussed with patient and spouse, all questions answered. Shreeya Recendiz L    Final Clinical Impressions(s) / ED Diagnoses   Final diagnoses:  Acute sinusitis, recurrence not specified, unspecified location   Evaluation consistent with acute bacterial sinusitis. Doubt pneumonia, sepsis, metabolic instability.   Nursing Notes Reviewed/ Care Coordinated Applicable Imaging Reviewed Interpretation of Laboratory Data incorporated into ED treatment  The patient appears reasonably screened and/or stabilized for discharge and I doubt any other medical condition or other [name] requiring further screening, evaluation, or treatment in the ED at this time prior to discharge.  Plan: Home Medications- continue usual, OTC prn; Home Treatments- rest, fluyids; return here if the recommended treatment, does not improve the symptoms; Recommended follow up- PCP prn   New Prescriptions New Prescriptions   AMOXICILLIN (AMOXIL) 500 MG CAPSULE    Take 1 capsule (500 mg total) by mouth 3 (three) times daily.     Daleen Bo, MD 06/07/16 [phone]

## 2016-06-07 NOTE — ED Triage Notes (Addendum)
Patient reports productive cough, congestion, and body aches x3 days. Denies abdominal pain, chest pain and N/V/D.

## 2016-06-07 NOTE — Discharge Instructions (Signed)
Get plenty of rest, drink a lot of fluids.  Use Tylenol for fever or pain.  Use Robitussin-DM for cough.  For nasal congestion, use Afrin nasal spray, twice a day for 3 or 4 days only.

## 2016-07-03 ENCOUNTER — Ambulatory Visit (INDEPENDENT_AMBULATORY_CARE_PROVIDER_SITE_OTHER): Payer: Medicare Other | Admitting: Pulmonary Disease

## 2016-07-03 ENCOUNTER — Encounter: Payer: Self-pay | Admitting: Pulmonary Disease

## 2016-07-03 VITALS — BP 102/64 | HR 86 | Ht 64.0 in | Wt 99.8 lb

## 2016-07-03 DIAGNOSIS — J432 Centrilobular emphysema: Secondary | ICD-10-CM | POA: Diagnosis not present

## 2016-07-03 NOTE — Patient Instructions (Signed)
Follow up in 6 months 

## 2016-07-03 NOTE — Progress Notes (Signed)
Current Outpatient Prescriptions on File Prior to Visit  Medication Sig  . acetaminophen (TYLENOL) 500 MG tablet Take 500 mg by mouth every 6 (six) hours as needed for mild pain.   . Adalimumab (HUMIRA) 40 MG/0.8ML PSKT Inject 40 mg into the skin every 14 (fourteen) days.  [name] albuterol (PROVENTIL HFA;VENTOLIN HFA) 108 (90 BASE) MCG/ACT inhaler Inhale 2 puffs into the lungs every 6 (six) hours as needed for wheezing or shortness of breath.  [name] amoxicillin (AMOXIL) 500 MG capsule Take 1 capsule (500 mg total) by mouth 3 (three) times daily.  . calcitRIOL (ROCALTROL) 0.5 MCG capsule Take 0.5 mcg by mouth daily.  . Cholecalciferol (VITAMIN D3) 2000 UNITS TABS Take 2,000 Units by mouth daily.   . cyanocobalamin (,VITAMIN B-12,) 1000 MCG/ML injection Inject 1,000 mcg into the muscle every 30 (thirty) days. Last injection was 04/28/12  . Fluticasone-Umeclidin-Vilant (TRELEGY ELLIPTA) 100-62.5-25 MCG/INH AEPB Inhale 1 puff into the lungs daily.  . folic acid (FOLVITE) 1 MG tablet Take 1 mg by mouth daily.  [name] guaiFENesin-dextromethorphan (ROBITUSSIN DM) 100-10 MG/5ML syrup Take 5 mLs by mouth every 4 (four) hours as needed for cough.  . levothyroxine (SYNTHROID, LEVOTHROID) 125 MCG tablet Take 125 mcg by mouth every morning.   . potassium chloride SA (K-DUR,KLOR-CON) 20 MEQ tablet Take 40 mEq by mouth 3 (three) times daily.   . promethazine (PHENERGAN) 25 MG tablet Take 25 mg by mouth every 6 (six) hours as needed for nausea or vomiting.   . sodium bicarbonate 650 MG tablet Take 1 tablet (650 mg total) by mouth 2 (two) times daily. (Patient taking differently: Take 1,300 mg by mouth 3 (three) times daily. )  . traMADol (ULTRAM) 50 MG tablet Take 1 tablet (50 mg total) by mouth every 6 (six) hours as needed for severe pain.   No current facility-administered medications on file prior to visit.     Chief Complaint  Patient presents with  . Follow-up    Pt states that the Trelegy seems to be working  pretty good since starting last OV. Pt states that she has noticed that when she is out and about she is getting SOB, has to stop and breathe.     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 female former smoker with COPD/emphysema.  She was treated for sinus infection in January.  This has improved, but she still has occasional runny nose.  She feels that trelegy has helped her breathing.  She is not having as much cough or wheeze.  She still gets winded with activity >> walking from grocery store to her care.  She denies chest pain, or leg swelling.  She has not heard from pulmonary rehab yet.  She is  followed by [facility] for Crohn's disease and is on humira and prednisone.  She is followed by nephrology for CKD IV, and there is concern she might progress to ESRD.   Physical exam  General - thin, pleasant ENT - no sinus tenderness, no oral exudate, no LAN Cardiac - regular, no murmur Chest - decreased BS, no wheeze Back - no tenderness Abd - soft, non tender Ext - no edema Neuro - normal strength Skin - no rashes Psych - normal mood   Assessment/plan  Severe COPD with emphysema. - continue trelegy and prn albuterol - advised that she could  pretreat with albuterol before going out - she is to call if she doesn't hear from pulmonary rehab soon  Crohn's colitis. - followed by Dr. Wynetta Emery with [facility]  Severe protein calorie malnutrition. - per primary team  Stage IV CKD. - f/u with Dr. Mercy Moore with nephrology   Patient Instructions  Follow up in 6 months    Chesley Mires, MD  Pulmonary/Critical Care/Sleep Pager:  [phone] 07/03/2016, 11:45 AM

## 2016-07-08 ENCOUNTER — Other Ambulatory Visit: Payer: Self-pay | Admitting: Internal Medicine

## 2016-07-08 DIAGNOSIS — I872 Venous insufficiency (chronic) (peripheral): Secondary | ICD-10-CM

## 2016-07-09 ENCOUNTER — Ambulatory Visit
Admission: RE | Admit: 2016-07-09 | Discharge: 2016-07-09 | Disposition: A | Discharge status: Home / Self Care | Payer: Medicare Other | Source: Ambulatory Visit | Attending: Internal Medicine | Admitting: Internal Medicine

## 2016-07-09 DIAGNOSIS — I872 Venous insufficiency (chronic) (peripheral): Secondary | ICD-10-CM

## 2016-07-09 NOTE — Consult Note (Signed)
Chief Complaint: Patient was seen in consultation today for  Chief Complaint  Patient presents with  . Advice Only    Consult for Venous Insufficiency   at the request of Varadarajan,Rupashree  Referring Physician(s): Varadarajan,Rupashree  History of Present Illness: Natasha [name] is a 71 y.o. female with a two-month history of a healing ulceration at the lateral aspect of the right fifth toe.  She has had some mild issues with intermittent edema in the past which has always responded quickly to oral Lasix.  However, approximately 2 months ago she developed more significant bilateral ankle and foot swelling. Her feet are so swollen she had a hard time fitting into her shoes. She began developing pain at the fifth digits and after a few weeks noticed the ulceration.  She was appropriate he treated with Lasix and did require doubling of her normal dose. Over the 2 month period, the edema finally resolved. Since the edema has resolved she has experienced slow but consistent healing of the ulceration. It is still mildly tender to the touch. She is currently wearing a small foam corn protective pads over the lesion which has been helpful. She has required no medication.  She denies symptoms of tired, heavy, achy legs at the end of the day. She does endorse bilateral lower extremity pain, but this is more focal in her knees and begins as soon as she gets up and begins walking. Her description of the pain is more consistent with arthritis.  When questioned, she also denies symptoms of claudication.  Past Medical History:  Diagnosis Date  . Anal fistula   . Anemia in chronic kidney disease (CKD)   . Arthritis   . Borderline hypertension   . Bulging disc    L3-L4  . Chronic diarrhea    due to crohn's  . CKD (chronic kidney disease), stage IV ([place])    NEPHROLOGIST--  DR MATTINGLY  . Crohn's disease ([place])    chronic ileitis  . Emphysema/COPD ([place])   . GERD (gastroesophageal  reflux disease)   . History of glaucoma   . History of pancreatitis    2008--  mercaptopurine  . History of small bowel obstruction    12-03-2010  due to crohn's ileitis  . Hypothyroidism, postsurgical    multinodule w/ hurthle cells  . Nephrolithiasis    bilateral  . Osteoporosis   . Perianal Crohn's disease ([place])   . Polyarthralgia   . Wears partial dentures    upper    Past Surgical History:  Procedure Laterality Date  . ABDOMINAL HYSTERECTOMY  1990   and  Appendectomy  . COLON RESECTION  x3 --  1978,  1987, 1989   ILEAL RESECTION x2/   [place]  . COLONOSCOPY WITH PROPOFOL N/A 09/25/2014   Procedure: COLONOSCOPY WITH PROPOFOL;  Surgeon: Garlan Fair, MD;  Location: [facility];  Service: Endoscopy;  Laterality: N/A;  . ESOPHAGOGASTRODUODENOSCOPY  03/04/2012   Procedure: ESOPHAGOGASTRODUODENOSCOPY (EGD);  Surgeon: Garlan Fair, MD;  Location: [facility];  Service: Endoscopy;  Laterality: N/A;  . EXAMINATION UNDER ANESTHESIA N/A 09/12/2014   Procedure: EXAM UNDER ANESTHESIA;  Surgeon: Rolm Bookbinder, MD;  Location: [place];  Service: General;  Laterality: N/A;  . FLEXIBLE SIGMOIDOSCOPY  03/04/2012   Procedure: FLEXIBLE SIGMOIDOSCOPY;  Surgeon: Garlan Fair, MD;  Location: [facility];  Service: Endoscopy;  Laterality: N/A;  . GLAUCOMA SURGERY Bilateral   . INCISION AND DRAINAGE PERIRECTAL ABSCESS N/A 09/12/2014   Procedure: IRRIGATION  AND DEBRIDEMENT PERIRECTAL ABSCESS;  Surgeon: Rolm Bookbinder, MD;  Location: [place];  Service: General;  Laterality: N/A;  . NEGATIVE SLEEP STUDY  2008  . PLACEMENT OF SETON N/A 12/01/2014   Procedure: PLACEMENT OF SETON;  Surgeon: [name], MD;  Location: [facility];  Service: General;  Laterality: N/A;  . RECTAL EXAM UNDER ANESTHESIA N/A 12/01/2014   Procedure: RECTAL EXAM UNDER ANESTHESIA;  Surgeon: [name], MD;  Location: [place];  Service: General;  Laterality: N/A;  .  TOTAL THYROIDECTOMY  10-13-2003  . TRANSTHORACIC ECHOCARDIOGRAM  07-11-2013   mild LVH,  ef 55%,  moderate AR,  mild MR and TR,  trivial PR    Allergies: Mercaptopurine and Remicade [infliximab]  Medications: Prior to Admission medications   Medication Sig Start Date End Date Taking? Authorizing Provider  acetaminophen (TYLENOL) 500 MG tablet Take 500 mg by mouth every 6 (six) hours as needed for mild pain.     Historical Provider, MD  Adalimumab (HUMIRA) 40 MG/0.8ML PSKT Inject 40 mg into the skin every 14 (fourteen) days.    Historical Provider, MD  albuterol (PROVENTIL HFA;VENTOLIN HFA) 108 (90 BASE) MCG/ACT inhaler Inhale 2 puffs into the lungs every 6 (six) hours as needed for wheezing or shortness of breath.    Historical Provider, MD  amoxicillin (AMOXIL) 500 MG capsule Take 1 capsule (500 mg total) by mouth 3 (three) times daily. 06/07/16   Daleen Bo, MD  calcitRIOL (ROCALTROL) 0.5 MCG capsule Take 0.5 mcg by mouth daily.    Historical Provider, MD  Cholecalciferol (VITAMIN D3) 2000 UNITS TABS Take 2,000 Units by mouth daily.  04/07/14   Historical Provider, MD  cyanocobalamin (,VITAMIN B-12,) 1000 MCG/ML injection Inject 1,000 mcg into the muscle every 30 (thirty) days. Last injection was 04/28/12    Historical Provider, MD  Fluticasone-Umeclidin-Vilant (TRELEGY ELLIPTA) 100-62.5-25 MCG/INH AEPB Inhale 1 puff into the lungs daily. 05/01/16   Chesley Mires, MD  folic acid (FOLVITE) 1 MG tablet Take 1 mg by mouth daily.    Historical Provider, MD  guaiFENesin-dextromethorphan (ROBITUSSIN DM) 100-10 MG/5ML syrup Take 5 mLs by mouth every 4 (four) hours as needed for cough. 10/15/15   Costin Karlyne Greenspan, MD  levothyroxine (SYNTHROID, LEVOTHROID) 125 MCG tablet Take 125 mcg by mouth every morning.     Historical Provider, MD  potassium chloride SA (K-DUR,KLOR-CON) 20 MEQ tablet Take 40 mEq by mouth 3 (three) times daily.  09/24/15   Historical Provider, MD  predniSONE (DELTASONE) 5 MG tablet  Take 5 mg by mouth daily with breakfast.    Historical Provider, MD  promethazine (PHENERGAN) 25 MG tablet Take 25 mg by mouth every 6 (six) hours as needed for nausea or vomiting.  04/19/14   Historical Provider, MD  sodium bicarbonate 650 MG tablet Take 1 tablet (650 mg total) by mouth 2 (two) times daily. Patient taking differently: Take 1,300 mg by mouth 3 (three) times daily.  04/07/14   Albertine Patricia, MD  traMADol (ULTRAM) 50 MG tablet Take 1 tablet (50 mg total) by mouth every 6 (six) hours as needed for severe pain. 03/17/16   Zenovia Jarred, DO     Family History  Problem Relation Age of Onset  . Hypertension Father   . Heart disease Father   . Heart attack Father   . Diverticulitis Mother   . COPD Mother   . Hypertension Mother   . Heart disease Mother   . Heart attack Mother   .  Colon cancer Brother   . Diabetes Brother   . Thyroid disease Brother     Social History   Social History  . Marital status: Married    Spouse name: [name]  . Number of children: 3  . Years of education: 12   Occupational History  . Retired Retired   Social History Main Topics  . Smoking status: Former Smoker    Packs/day: 0.25    Years: 15.00    Types: Cigarettes    Quit date: 11/27/2001  . Smokeless tobacco: Never Used  . Alcohol use No  . Drug use: No  . Sexual activity: No   Other Topics Concern  . Not on file   Social History Narrative   Patient is married [name]) and lives at home with her husband.   Patient has three children.   Patient is retired on disability.   Patient drinks two cups of caffeine daily.   Patient is right-handed.             Review of Systems: A 12 point ROS discussed and pertinent positives are indicated in the HPI above.  All other systems are negative.  Review of Systems  Vital Signs: BP (!) 97/59 (BP Location: Left Arm, Patient Position: Sitting, Cuff Size: Normal)   Pulse 89   Temp 97.9 F (36.6 C)   Ht 5' 4"  (1.626 m)   Wt 98  lb 8 oz (44.7 kg)   SpO2 94%   BMI 16.91 kg/m   Physical Exam  Constitutional: She is oriented to person, place, and time. She appears well-developed and well-nourished. No distress.  HENT:  Head: Normocephalic and atraumatic.  Eyes: No scleral icterus.  Cardiovascular: Normal rate and regular rhythm.   Pulses:      Dorsalis pedis pulses are 1+ on the right side, and 1+ on the left side.       Posterior tibial pulses are 1+ on the right side, and 1+ on the left side.  No superficial venous varicosities  Pulmonary/Chest: Effort normal and breath sounds normal.  Abdominal: Soft. She exhibits no distension. There is no tenderness.  Musculoskeletal:       Right ankle: She exhibits swelling.       Left ankle: She exhibits swelling.  Mild 1+ edema at the ankles bilaterally.   Neurological: She is alert and oriented to person, place, and time.  Skin: Skin is warm and dry.  No changes of hyperpigmentation or venous stasis dermatitis.      Psychiatric: She has a normal mood and affect. Her behavior is normal.  Nursing note reviewed.    Imaging: [place] Rad Eval And Mgmt  Result Date: 07/09/2016 Please refer to "Notes" to see consult details.   Labs:  CBC:  Recent Labs  10/12/15 1854 10/13/15 0346 03/17/16 1205 04/16/16 2058  WBC 8.7 7.5 9.0 10.5  HGB 12.1 11.4* 11.6* 11.5*  HCT 38.1 36.8 36.6 35.9*  PLT 145* 131* 130* 120*    COAGS: No results for input(s): INR, APTT in the last 8760 hours.  BMP:  Recent Labs  10/13/15 0346 10/14/15 0421 03/17/16 1205 04/16/16 2058  NA 142 137 141 137  K 3.6 4.1 3.1* 4.0  CL 116* 119* 121* 116*  CO2 12* 11* 12* 13*  GLUCOSE 112* 84 76 104*  BUN 36* 40* 34* 39*  CALCIUM 7.9* 8.2* 8.1* 8.5*  CREATININE 2.98* 2.99* 3.23* 4.00*  GFRNONAA 15* 15* 14* 10*  GFRAA 17* 17* 16* 12*  LIVER FUNCTION TESTS:  Recent Labs  10/12/15 1854 10/13/15 0346 04/16/16 2058  BILITOT 0.4 0.4 0.3  AST 15 13* 18  ALT 15 14 15   ALKPHOS  148* 125 128*  PROT 6.8 6.1* 6.7  ALBUMIN 3.1* 2.6* 3.5    TUMOR MARKERS: No results for input(s): AFPTM, CEA, CA199, CHROMGRNA in the last 8760 hours.  Assessment and Plan:  Her clinical and level II ultrasound evaluation today is negative for superficial venous insufficiency. She has no evidence of DVT in the affected right lower extremity. She does have evidence of chronic and partially recanalized DVT in the left lower extremity.  Furthermore, she does not experience symptoms of claudication and the pulses in her feet are palpable and symmetric bilaterally. Therefore, I don't suspect that this is an arterial ulceration.  I believe that the wound at the lateral aspect of her right small toe is secondary to a pressure ulcer from tight-fitting shoes when her feet were very swollen.  The lesion is now nearly healed and has an appearance more consistent with a corn or callus.  1.) Continue protective padding over the region of injury 2.) Continue Lasix as prescribed 3.) I encouraged her to resume use of her compression hose as this will likely resolve the last remaining bit of edema.  Thank you for this interesting consult.  I greatly enjoyed meeting YATZIRI WAINWRIGHT and look forward to participating in their care.  A copy of this report was sent to the requesting provider on this date.  Electronically Signed: [place], [place] 07/09/2016, 11:00 AM   I spent a total of  40 Minutes  in face to face in clinical consultation, greater than 50% of which was counseling/coordinating care for right 5th toe ulcer.

## 2016-07-11 ENCOUNTER — Emergency Department (HOSPITAL_COMMUNITY): Payer: Medicare Other

## 2016-07-11 ENCOUNTER — Encounter (HOSPITAL_COMMUNITY): Payer: Self-pay

## 2016-07-11 ENCOUNTER — Inpatient Hospital Stay (HOSPITAL_COMMUNITY)
Admission: EM | Admit: 2016-07-11 | Discharge: 2016-07-15 | DRG: 640 | Disposition: A | Discharge status: Home / Self Care | Payer: Medicare Other | Attending: Internal Medicine | Admitting: Internal Medicine

## 2016-07-11 DIAGNOSIS — Z79899 Other long term (current) drug therapy: Secondary | ICD-10-CM

## 2016-07-11 DIAGNOSIS — E872 Acidosis, unspecified: Secondary | ICD-10-CM | POA: Diagnosis present

## 2016-07-11 DIAGNOSIS — K5000 Crohn's disease of small intestine without complications: Secondary | ICD-10-CM | POA: Diagnosis present

## 2016-07-11 DIAGNOSIS — A084 Viral intestinal infection, unspecified: Secondary | ICD-10-CM | POA: Diagnosis present

## 2016-07-11 DIAGNOSIS — Z888 Allergy status to other drugs, medicaments and biological substances status: Secondary | ICD-10-CM

## 2016-07-11 DIAGNOSIS — E876 Hypokalemia: Principal | ICD-10-CM | POA: Diagnosis present

## 2016-07-11 DIAGNOSIS — R64 Cachexia: Secondary | ICD-10-CM | POA: Diagnosis present

## 2016-07-11 DIAGNOSIS — K529 Noninfective gastroenteritis and colitis, unspecified: Secondary | ICD-10-CM

## 2016-07-11 DIAGNOSIS — J439 Emphysema, unspecified: Secondary | ICD-10-CM | POA: Diagnosis present

## 2016-07-11 DIAGNOSIS — K314 Gastric diverticulum: Secondary | ICD-10-CM | POA: Diagnosis present

## 2016-07-11 DIAGNOSIS — J449 Chronic obstructive pulmonary disease, unspecified: Secondary | ICD-10-CM | POA: Diagnosis present

## 2016-07-11 DIAGNOSIS — Z681 Body mass index (BMI) 19 or less, adult: Secondary | ICD-10-CM

## 2016-07-11 DIAGNOSIS — Z9049 Acquired absence of other specified parts of digestive tract: Secondary | ICD-10-CM

## 2016-07-11 DIAGNOSIS — M810 Age-related osteoporosis without current pathological fracture: Secondary | ICD-10-CM | POA: Diagnosis present

## 2016-07-11 DIAGNOSIS — E43 Unspecified severe protein-calorie malnutrition: Secondary | ICD-10-CM | POA: Diagnosis present

## 2016-07-11 DIAGNOSIS — N200 Calculus of kidney: Secondary | ICD-10-CM | POA: Diagnosis present

## 2016-07-11 DIAGNOSIS — L8990 Pressure ulcer of unspecified site, unspecified stage: Secondary | ICD-10-CM | POA: Insufficient documentation

## 2016-07-11 DIAGNOSIS — Z825 Family history of asthma and other chronic lower respiratory diseases: Secondary | ICD-10-CM

## 2016-07-11 DIAGNOSIS — N186 End stage renal disease: Secondary | ICD-10-CM | POA: Diagnosis present

## 2016-07-11 DIAGNOSIS — D638 Anemia in other chronic diseases classified elsewhere: Secondary | ICD-10-CM | POA: Diagnosis present

## 2016-07-11 DIAGNOSIS — M25511 Pain in right shoulder: Secondary | ICD-10-CM | POA: Diagnosis not present

## 2016-07-11 DIAGNOSIS — D649 Anemia, unspecified: Secondary | ICD-10-CM | POA: Diagnosis present

## 2016-07-11 DIAGNOSIS — I82512 Chronic embolism and thrombosis of left femoral vein: Secondary | ICD-10-CM | POA: Diagnosis present

## 2016-07-11 DIAGNOSIS — E890 Postprocedural hypothyroidism: Secondary | ICD-10-CM | POA: Diagnosis present

## 2016-07-11 DIAGNOSIS — N184 Chronic kidney disease, stage 4 (severe): Secondary | ICD-10-CM

## 2016-07-11 DIAGNOSIS — D696 Thrombocytopenia, unspecified: Secondary | ICD-10-CM | POA: Diagnosis present

## 2016-07-11 DIAGNOSIS — Z7951 Long term (current) use of inhaled steroids: Secondary | ICD-10-CM

## 2016-07-11 DIAGNOSIS — Z833 Family history of diabetes mellitus: Secondary | ICD-10-CM

## 2016-07-11 DIAGNOSIS — D631 Anemia in chronic kidney disease: Secondary | ICD-10-CM | POA: Diagnosis present

## 2016-07-11 DIAGNOSIS — E860 Dehydration: Secondary | ICD-10-CM | POA: Diagnosis present

## 2016-07-11 DIAGNOSIS — Z87891 Personal history of nicotine dependence: Secondary | ICD-10-CM

## 2016-07-11 DIAGNOSIS — E039 Hypothyroidism, unspecified: Secondary | ICD-10-CM | POA: Diagnosis present

## 2016-07-11 DIAGNOSIS — K219 Gastro-esophageal reflux disease without esophagitis: Secondary | ICD-10-CM | POA: Diagnosis present

## 2016-07-11 DIAGNOSIS — I82532 Chronic embolism and thrombosis of left popliteal vein: Secondary | ICD-10-CM | POA: Diagnosis present

## 2016-07-11 DIAGNOSIS — Z7952 Long term (current) use of systemic steroids: Secondary | ICD-10-CM

## 2016-07-11 DIAGNOSIS — Z800 Family history of malignant neoplasm of digestive organs: Secondary | ICD-10-CM

## 2016-07-11 DIAGNOSIS — K5090 Crohn's disease, unspecified, without complications: Secondary | ICD-10-CM

## 2016-07-11 DIAGNOSIS — Z8249 Family history of ischemic heart disease and other diseases of the circulatory system: Secondary | ICD-10-CM

## 2016-07-11 DIAGNOSIS — N179 Acute kidney failure, unspecified: Secondary | ICD-10-CM | POA: Diagnosis present

## 2016-07-11 DIAGNOSIS — Z90710 Acquired absence of both cervix and uterus: Secondary | ICD-10-CM

## 2016-07-11 DIAGNOSIS — R112 Nausea with vomiting, unspecified: Secondary | ICD-10-CM | POA: Diagnosis present

## 2016-07-11 DIAGNOSIS — I129 Hypertensive chronic kidney disease with stage 1 through stage 4 chronic kidney disease, or unspecified chronic kidney disease: Secondary | ICD-10-CM | POA: Diagnosis present

## 2016-07-11 DIAGNOSIS — R197 Diarrhea, unspecified: Secondary | ICD-10-CM

## 2016-07-11 DIAGNOSIS — L97519 Non-pressure chronic ulcer of other part of right foot with unspecified severity: Secondary | ICD-10-CM | POA: Diagnosis present

## 2016-07-11 DIAGNOSIS — Z992 Dependence on renal dialysis: Secondary | ICD-10-CM

## 2016-07-11 LAB — COMPREHENSIVE METABOLIC PANEL
ALT: 11 U/L — ABNORMAL LOW (ref 14–54)
ANION GAP: 7 (ref 5–15)
AST: 15 U/L (ref 15–41)
Albumin: 3.5 g/dL (ref 3.5–5.0)
Alkaline Phosphatase: 120 U/L (ref 38–126)
BUN: 47 mg/dL — AB (ref 6–20)
CHLORIDE: 124 mmol/L — AB (ref 101–111)
CO2: 13 mmol/L — ABNORMAL LOW (ref 22–32)
Calcium: 7.3 mg/dL — ABNORMAL LOW (ref 8.9–10.3)
Creatinine, Ser: 4.93 mg/dL — ABNORMAL HIGH (ref 0.44–1.00)
GFR calc Af Amer: 9 mL/min — ABNORMAL LOW (ref >60)
GFR calc non Af Amer: 8 mL/min — ABNORMAL LOW (ref >60)
GLUCOSE: 81 mg/dL (ref 65–99)
POTASSIUM: 2.5 mmol/L — AB (ref 3.5–5.1)
SODIUM: 144 mmol/L (ref 135–145)
Total Bilirubin: 1 mg/dL (ref 0.3–1.2)
Total Protein: 6.9 g/dL (ref 6.5–8.1)

## 2016-07-11 LAB — CBC
HCT: 32.6 % — ABNORMAL LOW (ref 36.0–46.0)
HEMOGLOBIN: 10.6 g/dL — AB (ref 12.0–15.0)
MCH: 31.2 pg (ref 26.0–34.0)
MCHC: 32.5 g/dL (ref 30.0–36.0)
MCV: 95.9 fL (ref 78.0–100.0)
Platelets: 123 10*3/uL — ABNORMAL LOW (ref 150–400)
RBC: 3.4 MIL/uL — ABNORMAL LOW (ref 3.87–5.11)
RDW: 15.6 % — AB (ref 11.5–15.5)
WBC: 9.4 10*3/uL (ref 4.0–10.5)

## 2016-07-11 LAB — URINALYSIS, ROUTINE W REFLEX MICROSCOPIC
BILIRUBIN URINE: NEGATIVE
Glucose, UA: NEGATIVE mg/dL
Ketones, ur: NEGATIVE mg/dL
LEUKOCYTES UA: NEGATIVE
NITRITE: NEGATIVE
PROTEIN: 30 mg/dL — AB
SPECIFIC GRAVITY, URINE: 1.008 (ref 1.005–1.030)
pH: 5 (ref 5.0–8.0)

## 2016-07-11 LAB — LIPASE, BLOOD: Lipase: 38 U/L (ref 11–51)

## 2016-07-11 MED ORDER — MORPHINE SULFATE (PF) 4 MG/ML IV SOLN
4.0000 mg | Freq: Once | INTRAVENOUS | Status: AC
  Administered 2016-07-12: 4 mg via INTRAVENOUS
  Filled 2016-07-11: qty 1

## 2016-07-11 MED ORDER — ONDANSETRON HCL 4 MG/2ML IJ SOLN
4.0000 mg | Freq: Once | INTRAMUSCULAR | Status: AC
Start: 2016-07-11 — End: 2016-07-11
  Administered 2016-07-11: 4 mg via INTRAVENOUS
  Filled 2016-07-11: qty 2

## 2016-07-11 MED ORDER — IOPAMIDOL (ISOVUE-300) INJECTION 61%
INTRAVENOUS | Status: AC
  Filled 2016-07-11: qty 30

## 2016-07-11 MED ORDER — ONDANSETRON HCL 4 MG/2ML IJ SOLN
4.0000 mg | Freq: Once | INTRAMUSCULAR | Status: AC
  Administered 2016-07-12: 4 mg via INTRAVENOUS
  Filled 2016-07-11: qty 2

## 2016-07-11 MED ORDER — SODIUM CHLORIDE 0.9 % IV SOLN
Freq: Once | INTRAVENOUS | Status: AC
  Administered 2016-07-11: 20:00:00 via INTRAVENOUS
  Filled 2016-07-11: qty 1000

## 2016-07-11 MED ORDER — MORPHINE SULFATE (PF) 4 MG/ML IV SOLN
4.0000 mg | Freq: Once | INTRAVENOUS | Status: AC
  Administered 2016-07-11: 4 mg via INTRAVENOUS
  Filled 2016-07-11: qty 1

## 2016-07-11 NOTE — ED Notes (Signed)
Contacted pharmacy to send fluids ordered for patient.

## 2016-07-11 NOTE — ED Notes (Signed)
Bed: WA04 Expected date:  Expected time:  Means of arrival:  Comments: Hold for triage

## 2016-07-11 NOTE — ED Notes (Signed)
Pt ambulated to restroom for urine sample with a steady gait.

## 2016-07-11 NOTE — ED Provider Notes (Signed)
[facility] DEPT Provider Note   CSN: [id number] Arrival date & time: 07/11/16  1546     History   Chief Complaint Chief Complaint  Patient presents with  . Emesis  . Generalized Body Aches  . Abdominal Pain    HPI Natasha [name] is a 71 y.o. female.  HPI Patient started developing some abdominal pain about 2 days ago. She was she thought it might be her Crohn's disease. She then developed vomiting which has been persistent. She reports she has continued to vomit until her emesis turned yellow and was better. She reports that she has not mailed to tolerate anything she is taken by mouth for the past 2 days. She reports she has had some loose stool as well. She has had general body aches but no fever that she is aware of. Past Medical History:  Diagnosis Date  . Anal fistula   . Anemia in chronic kidney disease (CKD)   . Arthritis   . Borderline hypertension   . Bulging disc    L3-L4  . Chronic diarrhea    due to crohn's  . CKD (chronic kidney disease), stage IV ([place])    NEPHROLOGIST--  DR MATTINGLY  . Crohn's disease ([place])    chronic ileitis  . Emphysema/COPD ([place])   . GERD (gastroesophageal reflux disease)   . History of glaucoma   . History of pancreatitis    2008--  mercaptopurine  . History of small bowel obstruction    12-03-2010  due to crohn's ileitis  . Hypothyroidism, postsurgical    multinodule w/ hurthle cells  . Nephrolithiasis    bilateral  . Osteoporosis   . Perianal Crohn's disease ([place])   . Polyarthralgia   . Wears partial dentures    upper    Patient Active Problem List   Diagnosis Date Noted  . COPD exacerbation ([place]) 10/12/2015  . Malnutrition of moderate degree ([place]) 09/13/2014  . history of Left leg DVT 09/10/2014  . Hypotension 09/10/2014  . Hypoglycemia 09/10/2014  . Ischiorectal abscess 09/09/2014  . Increased anion gap metabolic acidosis   . Generalized abdominal pain   . Chronic combined systolic and diastolic CHF  (congestive heart failure) ([place])   . Essential hypertension   . Acute on chronic renal failure ([place])   . Secondary hyperparathyroidism ([place])   . Other specified hypothyroidism   . Right shoulder pain   . Thrombocytopenia ([place])   . Severe protein-calorie malnutrition ([place])   . Thyroid activity decreased   . Emphysema of lung ([place])   . Nephrolithiasis 03/29/2014  . AKI (acute kidney injury) ([place]) 03/29/2014  . Spinal stenosis of lumbar region 11/01/2013  . Bilateral leg pain 09/28/2013  . Emphysema/COPD ([place])   . Dyspnea 09/20/2013  . Protein-calorie malnutrition, severe ([place]) 05/14/2013  . Salmonella enteritidis 06/11/2012  . Hypocalcemia 06/10/2012  . Hypothyroidism 06/10/2012  . Anemia 06/09/2012  . Hypomagnesemia 06/09/2012  . CKD (chronic kidney disease) 06/09/2012  . CKD (chronic kidney disease) stage 4, GFR 15-29 ml/min (HCC) 06/09/2012  . Acute renal failure ([place]) 06/07/2012  . Dehydration 06/07/2012  . Crohn's disease s/p Colectomy, Terminal ilectomies.  Managed by [facility] 06/07/2012  . Metabolic acidosis 85/46/2703  . Hypokalemia 06/07/2012  . Diarrhea 06/07/2012  . UTI (lower urinary tract infection) 06/07/2012    Past Surgical History:  Procedure Laterality Date  . ABDOMINAL HYSTERECTOMY  1990   and  Appendectomy  . COLON RESECTION  x3 --  [place]  RESECTION x2/   RIGHT COLECTOMY 1989  . COLONOSCOPY WITH PROPOFOL N/A 09/25/2014   Procedure: COLONOSCOPY WITH PROPOFOL;  Surgeon: Garlan Fair, MD;  Location: [facility];  Service: Endoscopy;  Laterality: N/A;  . ESOPHAGOGASTRODUODENOSCOPY  03/04/2012   Procedure: ESOPHAGOGASTRODUODENOSCOPY (EGD);  Surgeon: Garlan Fair, MD;  Location: [facility];  Service: Endoscopy;  Laterality: N/A;  . EXAMINATION UNDER ANESTHESIA N/A 09/12/2014   Procedure: EXAM UNDER ANESTHESIA;  Surgeon: Rolm Bookbinder, MD;  Location: [facility];  Service: General;  Laterality: N/A;  . FLEXIBLE SIGMOIDOSCOPY  03/04/2012    Procedure: FLEXIBLE SIGMOIDOSCOPY;  Surgeon: Garlan Fair, MD;  Location: [facility];  Service: Endoscopy;  Laterality: N/A;  . GLAUCOMA SURGERY Bilateral   . INCISION AND DRAINAGE PERIRECTAL ABSCESS N/A 09/12/2014   Procedure: IRRIGATION AND DEBRIDEMENT PERIRECTAL ABSCESS;  Surgeon: Rolm Bookbinder, MD;  Location: [facility];  Service: General;  Laterality: N/A;  . NEGATIVE SLEEP STUDY  2008  . PLACEMENT OF SETON N/A 12/01/2014   Procedure: PLACEMENT OF SETON;  Surgeon: [name], MD;  Location[facility];  Service: General;  Laterality: N/A;  . RECTAL EXAM UNDER ANESTHESIA N/A 12/01/2014   Procedure: RECTAL EXAM UNDER ANESTHESIA;  Surgeon: [name], MD;  Location: [facility];  Service: General;  Laterality: N/A;  . TOTAL THYROIDECTOMY  10-13-2003  . TRANSTHORACIC ECHOCARDIOGRAM  07-11-2013   mild LVH,  ef 55%,  moderate AR,  mild MR and TR,  trivial PR    OB History    No data available       Home Medications    Prior to Admission medications   Medication Sig Start Date End Date Taking? Authorizing Provider  acetaminophen (TYLENOL) 500 MG tablet Take 1,000 mg by mouth every 6 (six) hours as needed for mild pain.    Yes Historical Provider, MD  Adalimumab (HUMIRA) 40 MG/0.8ML PSKT Inject 40 mg into the skin every 14 (fourteen) days.   Yes Historical Provider, MD  albuterol (PROVENTIL HFA;VENTOLIN HFA) 108 (90 BASE) MCG/ACT inhaler Inhale 2 puffs into the lungs every 6 (six) hours as needed for wheezing or shortness of breath.   Yes Historical Provider, MD  amoxicillin-clavulanate (AUGMENTIN) 500-125 MG tablet Take 1 tablet by mouth 2 (two) times daily. Started 2/13 x 7 days 07/08/16  Yes Historical Provider, MD  calcitRIOL (ROCALTROL) 0.5 MCG capsule Take 0.5 mcg by mouth daily.   Yes Historical Provider, MD  Cholecalciferol (VITAMIN D3) 2000 UNITS TABS Take 2,000 Units by mouth daily.  04/07/14  Yes Historical Provider, MD  cyanocobalamin  (,VITAMIN B-12,) 1000 MCG/ML injection Inject 1,000 mcg into the muscle every 30 (thirty) days. Last injection was 04/28/12   Yes Historical Provider, MD  Fluticasone-Umeclidin-Vilant (TRELEGY ELLIPTA) 100-62.5-25 MCG/INH AEPB Inhale 1 puff into the lungs daily. 05/01/16  Yes Chesley Mires, MD  folic acid (FOLVITE) 1 MG tablet Take 1 mg by mouth daily.   Yes Historical Provider, MD  furosemide (LASIX) 40 MG tablet Take 40 mg by mouth 3 (three) times daily as needed. 06/26/16  Yes Historical Provider, MD  levothyroxine (SYNTHROID, LEVOTHROID) 125 MCG tablet Take 125 mcg by mouth every morning.    Yes Historical Provider, MD  potassium chloride SA (K-DUR,KLOR-CON) 20 MEQ tablet Take 60 mEq by mouth 4 (four) times daily.  09/24/15  Yes Historical Provider, MD  predniSONE (DELTASONE) 5 MG tablet Take 5 mg by mouth daily with breakfast.   Yes Historical Provider, MD  promethazine (PHENERGAN) 25 MG tablet  Take 25 mg by mouth every 6 (six) hours as needed for nausea or vomiting.  04/19/14  Yes Historical Provider, MD  sodium bicarbonate 650 MG tablet Take 1 tablet (650 mg total) by mouth 2 (two) times daily. Patient taking differently: Take 1,950 mg by mouth 3 (three) times daily.  04/07/14  Yes Albertine Patricia, MD  traMADol (ULTRAM) 50 MG tablet Take 1 tablet (50 mg total) by mouth every 6 (six) hours as needed for severe pain. 03/17/16  Yes Zenovia Jarred, DO  amoxicillin (AMOXIL) 500 MG capsule Take 1 capsule (500 mg total) by mouth 3 (three) times daily. Patient not taking: Reported on 07/11/2016 06/07/16   Daleen Bo, MD    Family History Family History  Problem Relation Age of Onset  . Hypertension Father   . Heart disease Father   . Heart attack Father   . Diverticulitis Mother   . COPD Mother   . Hypertension Mother   . Heart disease Mother   . Heart attack Mother   . Colon cancer Brother   . Diabetes Brother   . Thyroid disease Brother     Social History Social History  Substance Use  Topics  . Smoking status: Former Smoker    Packs/day: 0.25    Years: 15.00    Types: Cigarettes    Quit date: 11/27/2001  . Smokeless tobacco: Never Used  . Alcohol use No     Allergies   Mercaptopurine and Remicade [infliximab]   Review of Systems Review of Systems 10 Systems reviewed and are negative for acute change except as noted in the HPI.  Physical Exam Updated Vital Signs BP 113/66   Pulse 102   Temp 98.2 F (36.8 C) (Oral)   Resp 16   Ht 5' 4"  (1.626 m)   Wt 98 lb (44.5 kg)   SpO2 100%   BMI 16.82 kg/m   Physical Exam  Constitutional: She is oriented to person, place, and time. She appears well-developed and well-nourished. No distress.  HENT:  Head: Normocephalic and atraumatic.  Eyes: Conjunctivae are normal.  Neck: Neck supple.  Cardiovascular: Normal rate and regular rhythm.   No murmur heard. Pulmonary/Chest: Effort normal and breath sounds normal. No respiratory distress.  Abdominal: Soft. She exhibits no distension. There is tenderness.  Lower abdomen mild diffuse discomfort palpation. No guarding.  Musculoskeletal: Normal range of motion. She exhibits no deformity.  Neurological: She is alert and oriented to person, place, and time. She exhibits normal muscle tone. Coordination normal.  Skin: Skin is warm and dry.  Psychiatric: She has a normal mood and affect.  Nursing note and vitals reviewed.    ED Treatments / Results  Labs (all labs ordered are listed, but only abnormal results are displayed) Labs Reviewed  COMPREHENSIVE METABOLIC PANEL - Abnormal; Notable for the following:       Result Value   Potassium 2.5 (*)    Chloride 124 (*)    CO2 13 (*)    BUN 47 (*)    Creatinine, Ser 4.93 (*)    Calcium 7.3 (*)    ALT 11 (*)    GFR calc non Af Amer 8 (*)    GFR calc Af Amer 9 (*)    All other components within normal limits  CBC - Abnormal; Notable for the following:    RBC 3.40 (*)    Hemoglobin 10.6 (*)    HCT 32.6 (*)    RDW  15.6 (*)  Platelets 123 (*)    All other components within normal limits  URINALYSIS, ROUTINE W REFLEX MICROSCOPIC - Abnormal; Notable for the following:    Color, Urine STRAW (*)    Hgb urine dipstick MODERATE (*)    Protein, ur 30 (*)    Bacteria, UA RARE (*)    Squamous Epithelial / LPF 0-5 (*)    All other components within normal limits  LIPASE, BLOOD    EKG  EKG Interpretation None       Radiology Ct Abdomen Pelvis Wo Contrast  Result Date: 07/11/2016 CLINICAL DATA:  Initial evaluation for acute abdominal pain, vomiting, loose stools. History of Crohn's disease. EXAM: CT ABDOMEN AND PELVIS WITHOUT CONTRAST TECHNIQUE: Multidetector CT imaging of the abdomen and pelvis was performed following the standard protocol without IV contrast. COMPARISON:  Prior CT from 04/17/2016. FINDINGS: Lower chest: Mild scattered atelectatic changes noted within the visualized lung bases. The visualized lungs are otherwise clear. Trace pericardial effusion noted. Hepatobiliary: Limited noncontrast evaluation of the liver is unremarkable. Gallbladder surgically absent. Prominence of the common bile duct most likely related to post cholecystectomy changes. Pancreas: Pancreas within normal limits without acute inflammatory changes. Mild prominence of the pancreatic duct is stable from previous. Spleen: Spleen within normal limits. Adrenals/Urinary Tract: Adrenal glands are unremarkable. Multiple scattered bilateral nonobstructive calculi present within the kidneys bilaterally. Largest of these on the left is positioned within the lower pole and measures 6 mm. Largest on the right also present within the lower pole and measures 7 mm. No hydronephrosis. Few small superimposed cysts noted within the kidneys bilaterally. No radiopaque calculi seen along either ureter. There is no hydroureter. Bladder within normal limits. Stomach/Bowel: Small diverticulum noted at the gastric fundus without associated  inflammation. Stomach otherwise unremarkable. Patient is status post ileocolonic resection with anastomotic suture within the mid abdomen. No evidence for obstruction. No significant wall thickening or inflammatory stranding about the anastomotic site. There is mild circumferential wall thickening about the distal colon, essentially extending from the anastomosis distally through the rectum, which may reflect mild colitis. No significant perienteric inflammatory stranding. Intraluminal fluid density throughout the colon, suggesting associated diarrheal illness. No other significant inflammatory changes about the bowels. Appendix not visualize, and is likely absent. Vascular/Lymphatic: Moderate aorto bi-iliac atherosclerotic disease. No aneurysm. No adenopathy. Reproductive: Uterus is absent.  Ovaries not discretely identified. Other: No free air or fluid. Musculoskeletal: No acute osseous abnormality. No worrisome lytic or blastic osseous lesions. IMPRESSION: 1. Mild circumferential wall thickening about the colon, suggesting mild colitis. This may be either infectious or inflammatory in nature. Associated intraluminal fluid density throughout the colon suggests associated acute diarrheal illness. 2. No other acute intra-abdominal or pelvic process identified. 3. Bilateral nonobstructive nephrolithiasis as above. 4. Small gastric diverticulum without associated inflammation. 5. Moderate aorto bi-iliac atherosclerotic disease.  No aneurysm. 6. Status post cholecystectomy. Electronically Signed   By: Jeannine Boga M.D.   On: 07/11/2016 23:24    Procedures Procedures (including critical care time)  Medications Ordered in ED Medications  iopamidol (ISOVUE-300) 61 % injection (not administered)  morphine 4 MG/ML injection 4 mg (not administered)  ondansetron (ZOFRAN) injection 4 mg (not administered)  sodium chloride 0.9 % 1,000 mL with potassium chloride 80 mEq infusion ( Intravenous Given 07/11/16  2015)  ondansetron (ZOFRAN) injection 4 mg (4 mg Intravenous Given 07/11/16 1954)  morphine 4 MG/ML injection 4 mg (4 mg Intravenous Given 07/11/16 1954)     Initial Impression / Assessment and Plan /  ED Course  I have reviewed the triage vital signs and the nursing notes.  Pertinent labs & imaging results that were available during my care of the patient were reviewed by me and considered in my medical decision making (see chart for details).      Final Clinical Impressions(s) / ED Diagnoses   Final diagnoses:  Gastroenteritis  Hypokalemia  Chronic renal failure, stage 4 (severe) (HCC)  Crohn's disease without complication, unspecified gastrointestinal tract location [facility])  Patient as having both recurrent vomiting and diarrhea over the past 2-3 days. She has been unable to tolerate oral intake. Patient is hypokalemic. She also has chronic, severe renal insufficiency. CT does not show significant Crohn's disease exacerbation. There is some diffuse wall thickening of the colon but no stranding inflammatory change. At this time, I suspect viral gastroenteritis more than Crohn's exacerbation as the etiology of the patient's symptoms. Plan will to continue IV hydration and potassium replacement with observation in the hospital.  New Prescriptions New Prescriptions   No medications on file     Charlesetta Shanks, MD 07/12/16 0002

## 2016-07-11 NOTE — ED Notes (Signed)
Patient refused Zofran when offered. Patient states she has been taking Zofran and another nausea medication with no relief.

## 2016-07-11 NOTE — ED Triage Notes (Signed)
Patient c/o vomiting, abdominal pain, and body aches x 2 days. Patient has a history of Crohn's has loose stool. Patient states she thinks 2 episodes were darker than usual.

## 2016-07-12 DIAGNOSIS — L97519 Non-pressure chronic ulcer of other part of right foot with unspecified severity: Secondary | ICD-10-CM | POA: Diagnosis present

## 2016-07-12 DIAGNOSIS — K5090 Crohn's disease, unspecified, without complications: Secondary | ICD-10-CM

## 2016-07-12 DIAGNOSIS — N184 Chronic kidney disease, stage 4 (severe): Secondary | ICD-10-CM

## 2016-07-12 DIAGNOSIS — D696 Thrombocytopenia, unspecified: Secondary | ICD-10-CM | POA: Diagnosis present

## 2016-07-12 DIAGNOSIS — K529 Noninfective gastroenteritis and colitis, unspecified: Secondary | ICD-10-CM | POA: Diagnosis not present

## 2016-07-12 DIAGNOSIS — K5080 Crohn's disease of both small and large intestine without complications: Secondary | ICD-10-CM

## 2016-07-12 DIAGNOSIS — Z7952 Long term (current) use of systemic steroids: Secondary | ICD-10-CM | POA: Diagnosis not present

## 2016-07-12 DIAGNOSIS — E876 Hypokalemia: Principal | ICD-10-CM

## 2016-07-12 DIAGNOSIS — J439 Emphysema, unspecified: Secondary | ICD-10-CM | POA: Diagnosis present

## 2016-07-12 DIAGNOSIS — I82532 Chronic embolism and thrombosis of left popliteal vein: Secondary | ICD-10-CM | POA: Diagnosis present

## 2016-07-12 DIAGNOSIS — Z681 Body mass index (BMI) 19 or less, adult: Secondary | ICD-10-CM | POA: Diagnosis not present

## 2016-07-12 DIAGNOSIS — N200 Calculus of kidney: Secondary | ICD-10-CM

## 2016-07-12 DIAGNOSIS — Z8249 Family history of ischemic heart disease and other diseases of the circulatory system: Secondary | ICD-10-CM | POA: Diagnosis not present

## 2016-07-12 DIAGNOSIS — A084 Viral intestinal infection, unspecified: Secondary | ICD-10-CM | POA: Diagnosis present

## 2016-07-12 DIAGNOSIS — K219 Gastro-esophageal reflux disease without esophagitis: Secondary | ICD-10-CM | POA: Diagnosis present

## 2016-07-12 DIAGNOSIS — E872 Acidosis: Secondary | ICD-10-CM | POA: Diagnosis present

## 2016-07-12 DIAGNOSIS — Z833 Family history of diabetes mellitus: Secondary | ICD-10-CM | POA: Diagnosis not present

## 2016-07-12 DIAGNOSIS — D631 Anemia in chronic kidney disease: Secondary | ICD-10-CM | POA: Diagnosis present

## 2016-07-12 DIAGNOSIS — J449 Chronic obstructive pulmonary disease, unspecified: Secondary | ICD-10-CM | POA: Diagnosis present

## 2016-07-12 DIAGNOSIS — K5000 Crohn's disease of small intestine without complications: Secondary | ICD-10-CM | POA: Diagnosis present

## 2016-07-12 DIAGNOSIS — N179 Acute kidney failure, unspecified: Secondary | ICD-10-CM

## 2016-07-12 DIAGNOSIS — E43 Unspecified severe protein-calorie malnutrition: Secondary | ICD-10-CM | POA: Diagnosis present

## 2016-07-12 DIAGNOSIS — E860 Dehydration: Secondary | ICD-10-CM

## 2016-07-12 DIAGNOSIS — E039 Hypothyroidism, unspecified: Secondary | ICD-10-CM

## 2016-07-12 DIAGNOSIS — R64 Cachexia: Secondary | ICD-10-CM | POA: Diagnosis present

## 2016-07-12 DIAGNOSIS — R197 Diarrhea, unspecified: Secondary | ICD-10-CM

## 2016-07-12 DIAGNOSIS — Z87891 Personal history of nicotine dependence: Secondary | ICD-10-CM | POA: Diagnosis not present

## 2016-07-12 DIAGNOSIS — I82512 Chronic embolism and thrombosis of left femoral vein: Secondary | ICD-10-CM | POA: Diagnosis present

## 2016-07-12 LAB — CBC
HCT: 29.2 % — ABNORMAL LOW (ref 36.0–46.0)
Hemoglobin: 9.4 g/dL — ABNORMAL LOW (ref 12.0–15.0)
MCH: 31.3 pg (ref 26.0–34.0)
MCHC: 32.2 g/dL (ref 30.0–36.0)
MCV: 97.3 fL (ref 78.0–100.0)
PLATELETS: 98 10*3/uL — AB (ref 150–400)
RBC: 3 MIL/uL — ABNORMAL LOW (ref 3.87–5.11)
RDW: 15.5 % (ref 11.5–15.5)
WBC: 7.2 10*3/uL (ref 4.0–10.5)

## 2016-07-12 LAB — BASIC METABOLIC PANEL
Anion gap: 5 (ref 5–15)
BUN: 45 mg/dL — AB (ref 6–20)
CALCIUM: 6.6 mg/dL — AB (ref 8.9–10.3)
CO2: 14 mmol/L — ABNORMAL LOW (ref 22–32)
Chloride: 126 mmol/L — ABNORMAL HIGH (ref 101–111)
Creatinine, Ser: 4.31 mg/dL — ABNORMAL HIGH (ref 0.44–1.00)
GFR calc Af Amer: 11 mL/min — ABNORMAL LOW (ref >60)
GFR, EST NON AFRICAN AMERICAN: 10 mL/min — AB (ref >60)
Glucose, Bld: 67 mg/dL (ref 65–99)
Potassium: 3.4 mmol/L — ABNORMAL LOW (ref 3.5–5.1)
SODIUM: 145 mmol/L (ref 135–145)

## 2016-07-12 MED ORDER — TRAMADOL HCL 50 MG PO TABS
50.0000 mg | ORAL_TABLET | Freq: Four times a day (QID) | ORAL | Status: DC | PRN
  Administered 2016-07-12 – 2016-07-14 (×4): 50 mg via ORAL
  Filled 2016-07-12 (×4): qty 1

## 2016-07-12 MED ORDER — ACETAMINOPHEN 325 MG PO TABS: 650.0000 mg | ORAL_TABLET | Freq: Four times a day (QID) | ORAL | Status: DC | PRN

## 2016-07-12 MED ORDER — ONDANSETRON HCL 4 MG/2ML IJ SOLN
4.0000 mg | Freq: Four times a day (QID) | INTRAMUSCULAR | Status: DC | PRN
  Administered 2016-07-12 – 2016-07-14 (×5): 4 mg via INTRAVENOUS
  Filled 2016-07-12 (×5): qty 2

## 2016-07-12 MED ORDER — POTASSIUM CHLORIDE CRYS ER 20 MEQ PO TBCR
40.0000 meq | EXTENDED_RELEASE_TABLET | Freq: Two times a day (BID) | ORAL | Status: DC
  Administered 2016-07-12 – 2016-07-13 (×3): 40 meq via ORAL
  Filled 2016-07-12 (×3): qty 2

## 2016-07-12 MED ORDER — SODIUM BICARBONATE 8.4 % IV SOLN
INTRAVENOUS | Status: DC
  Administered 2016-07-12 – 2016-07-14 (×3): via INTRAVENOUS
  Filled 2016-07-12 (×3): qty 150

## 2016-07-12 MED ORDER — ONDANSETRON HCL 4 MG PO TABS: 4.0000 mg | ORAL_TABLET | Freq: Four times a day (QID) | ORAL | Status: DC | PRN

## 2016-07-12 MED ORDER — IPRATROPIUM-ALBUTEROL 0.5-2.5 (3) MG/3ML IN SOLN
3.0000 mL | Freq: Four times a day (QID) | RESPIRATORY_TRACT | Status: DC
  Administered 2016-07-12: 3 mL via RESPIRATORY_TRACT
  Filled 2016-07-12: qty 3

## 2016-07-12 MED ORDER — ACETAMINOPHEN 650 MG RE SUPP: 650.0000 mg | Freq: Four times a day (QID) | RECTAL | Status: DC | PRN

## 2016-07-12 MED ORDER — SODIUM CHLORIDE 0.9% FLUSH
3.0000 mL | Freq: Two times a day (BID) | INTRAVENOUS | Status: DC
  Administered 2016-07-14 – 2016-07-15 (×2): 3 mL via INTRAVENOUS

## 2016-07-12 MED ORDER — PREDNISONE 5 MG PO TABS
5.0000 mg | ORAL_TABLET | Freq: Every day | ORAL | Status: DC
Start: 2016-07-12 — End: 2016-07-15
  Administered 2016-07-12 – 2016-07-15 (×4): 5 mg via ORAL
  Filled 2016-07-12 (×4): qty 1

## 2016-07-12 MED ORDER — CALCITRIOL 0.5 MCG PO CAPS
0.5000 ug | ORAL_CAPSULE | Freq: Every day | ORAL | Status: DC
  Administered 2016-07-12 – 2016-07-15 (×4): 0.5 ug via ORAL
  Filled 2016-07-12 (×4): qty 1

## 2016-07-12 MED ORDER — HYDROMORPHONE HCL 1 MG/ML IJ SOLN: 0.5000 mg | INTRAMUSCULAR | Status: DC | PRN

## 2016-07-12 MED ORDER — LEVOTHYROXINE SODIUM 125 MCG PO TABS
125.0000 ug | ORAL_TABLET | Freq: Every day | ORAL | Status: DC
  Administered 2016-07-12 – 2016-07-15 (×4): 125 ug via ORAL
  Filled 2016-07-12 (×4): qty 1

## 2016-07-12 MED ORDER — POTASSIUM CHLORIDE CRYS ER 20 MEQ PO TBCR
40.0000 meq | EXTENDED_RELEASE_TABLET | Freq: Every day | ORAL | Status: DC
  Administered 2016-07-12: 40 meq via ORAL
  Filled 2016-07-12: qty 2

## 2016-07-12 MED ORDER — FOLIC ACID 1 MG PO TABS
1.0000 mg | ORAL_TABLET | Freq: Every day | ORAL | Status: DC
  Administered 2016-07-12 – 2016-07-15 (×4): 1 mg via ORAL
  Filled 2016-07-12 (×4): qty 1

## 2016-07-12 MED ORDER — IPRATROPIUM-ALBUTEROL 0.5-2.5 (3) MG/3ML IN SOLN
3.0000 mL | Freq: Two times a day (BID) | RESPIRATORY_TRACT | Status: DC
  Administered 2016-07-12 – 2016-07-13 (×3): 3 mL via RESPIRATORY_TRACT
  Filled 2016-07-12 (×2): qty 3

## 2016-07-12 MED ORDER — SODIUM BICARBONATE 650 MG PO TABS
1950.0000 mg | ORAL_TABLET | Freq: Three times a day (TID) | ORAL | Status: DC
  Administered 2016-07-12 – 2016-07-15 (×10): 1950 mg via ORAL
  Filled 2016-07-12 (×10): qty 3

## 2016-07-12 MED ORDER — SODIUM BICARBONATE 8.4 % IV SOLN
INTRAVENOUS | Status: AC
  Administered 2016-07-12: 03:00:00 via INTRAVENOUS
  Filled 2016-07-12: qty 150

## 2016-07-12 MED ORDER — SODIUM CHLORIDE 0.9 % IV SOLN: INTRAVENOUS | Status: DC

## 2016-07-12 MED ORDER — LEVOTHYROXINE SODIUM 125 MCG PO TABS: 125.0000 ug | ORAL_TABLET | Freq: Every morning | ORAL | Status: DC

## 2016-07-12 MED ORDER — METHYLPREDNISOLONE SODIUM SUCC 40 MG IJ SOLR
10.0000 mg | Freq: Once | INTRAMUSCULAR | Status: AC
  Administered 2016-07-12: 10 mg via INTRAVENOUS
  Filled 2016-07-12: qty 1

## 2016-07-12 NOTE — Progress Notes (Signed)
Patient was admitted overnight after midnight and H and P has been reviewed and I am in current agreement with the Assessment and Plan done by Dr. Eulas Post. Patient is a 71 year old AAF with a PMH of Crohns Disease s/p ilecolonic resection, COPD/Emphysema, CKD4, Chronic Anemia, GERD, Hypothyroidism and other comorbidities who presented to [facility] with a cc of  who presented with Nausea, Vomiting, Abdominal Pain and Watery semi-formed dark stools for over 2 days now. She was evaluated by her PCP on Tuesday for a small ulcer on the lateral aspect of her Right fifth toe and placed on Augmentin for it and started developing symptoms after. She was was found to be FOBT Positive and Hb/Hct dropped from 10.6 -> 9.4 (baseline around 11.5). Patient states diarrhea has now ceased but patient still having Abdominal Pain and Nausea. Patient is on Humira and Oral Prednisone for her Crohns disease as an outpatient. C Diff PCR was obtained and was Negative and GI Stool Panel still pending. Patient will remain on Enteric Precautions. CT of the Abdomen was reviewed and showed There is mild circumferential wall thickening about the distal colon, essentially extending from the anastomosis distally through the rectum, which may reflect mild colitis. No significant perienteric inflammatory stranding. Intraluminal fluid density throughout the colon, suggesting associated diarrheal illness. Discussed case with [facility] Physician Dr. Watt Climes and he will review patient's chart and make recommendations, however stated to give her 10 mg IV of Solumedrol in the interm while he reviews case. Patient's Cr improved with IVF rehydration with NS+KCl. Case was also discussed with Nephrology Dr. Pearson Grippe over the phone who recommended placing the patient on Bicarbonate Drip with D5W and 3 amps of Bicarbonate and recommended Potassium Repletion. If patient was getting worse he stated to call back and he would formally evaluate the patient. Will continue to  monitor extremely closely and follow CBC's and CMP's.

## 2016-07-12 NOTE — Progress Notes (Signed)
RN received report from ED Nurse. Pt arrived unit, alert and oriented, able to communicate needs. Will continue with current plan of care.

## 2016-07-12 NOTE — H&P (Signed)
History and Physical    SANTIAGA BUTZIN IPJ:[id number] DOB: 1945-10-20 DOA: 07/11/2016  PCP: Ileana Roup, MD   Patient coming from: Home  Chief Complaint: Nausea, vomiting, and abdominal pain x 2 days  HPI: Natasha BOZARTH is a 71 y.o. woman with a history of Crohn's disease S/P ileocolonic resection, COPD/emphysema, CKD 4 (baseline creatinine appears to be around 3), chronic anemia, GERD, and hypothyroidism who presents to the ED for evaluation of abdominal pain, nausea, and vomiting that started two days ago.  She denies any known sick contacts.  She denies fever or sweats but she has had chills.  In the past day, she has developed loose stools.  Some have been watery; some have been semi-formed and dark (but no black).  She denies evidence of gross blood loss.  She has been light-headed, but no syncope.  She has taken acetaminophen for her abdominal pain.  She was recently put on Augmentin for a small ulcer on the fifth toe of her right foot.  ED Course: She is afebrile with a normal WBC count in the ED.  Hgb 10.6 (baseline appears to be around 11.5).  Platelets 123.  Potassium 2.5.  Bicarb 13.  BUN 47.  Creatinine 4.93.  CT shows mild circumferential wall thickening of the colon suggestive of a mild colitis (etiology unclear).  ED attending does not feel strongly that her clinical presentation is indicative of a Crohn's flair at this point.  IV antibiotics and IV steroids have not been given at this point.  The patient has received 27mq of KCl IV.  Hospitalist asked to place in observation for further evaluation.  Review of Systems: As per HPI otherwise 10 point review of systems negative.    Past Medical History:  Diagnosis Date  . Anal fistula   . Anemia in chronic kidney disease (CKD)   . Arthritis   . Borderline hypertension   . Bulging disc    L3-L4  . Chronic diarrhea    due to crohn's  . CKD (chronic [place] disease), stage IV ([place]    NEPHROLOGIST--  DR MATTINGLY   . [place] disease ([facility]    chronic ileitis  . Emphysema/COPD ([facility] [place]   . GERD (gastroesophageal reflux disease)   . History of glaucoma   . History of pancreatitis    2008--  mercaptopurine  . History of small bowel obstruction    12-03-2010  due to crohn's ileitis  . Hypothyroidism, postsurgical    multinodule w/ hurthle cells  . Nephrolithiasis    bilateral  . Osteoporosis   . Perianal Crohn's disease ([facility]   . Polyarthralgia   . Wears partial dentures    upper    Past Surgical History:  Procedure Laterality Date  . ABDOMINAL HYSTERECTOMY  1990   and  Appendectomy  . COLON RESECTION  x3 --  1978,  1987, 1989   [place] x2/   [facility] . COLONOSCOPY WITH PROPOFOL N/A 09/25/2014   Procedure: COLONOSCOPY WITH PROPOFOL;  Surgeon: MGarlan Fair MD;  Location: [facility];  Service: Endoscopy;  Laterality: N/A;  . ESOPHAGOGASTRODUODENOSCOPY  03/04/2012   Procedure: ESOPHAGOGASTRODUODENOSCOPY (EGD);  Surgeon: MGarlan Fair MD;  [facility];  Service: Endoscopy;  Laterality: N/A;  . EXAMINATION UNDER ANESTHESIA N/A 09/12/2014   Procedure: EXAM UNDER ANESTHESIA;  Surgeon: MRolm Bookbinder MD;  [place] [facility]  Service: General;  Laterality: N/A;  . FLEXIBLE SIGMOIDOSCOPY  03/04/2012   Procedure: FLEXIBLE SIGMOIDOSCOPY;  Surgeon: MHassell Done  Sandria Senter, MD;  [facility];  Service: Endoscopy;  Laterality: N/A;  . GLAUCOMA SURGERY Bilateral   . INCISION AND DRAINAGE PERIRECTAL ABSCESS N/A 09/12/2014   Procedure: IRRIGATION AND DEBRIDEMENT PERIRECTAL ABSCESS;  Surgeon: Rolm Bookbinder, MD;  Location: [place];  Service: General;  Laterality: N/A;  . NEGATIVE SLEEP STUDY  2008  . PLACEMENT OF SETON N/A 12/01/2014   Procedure: PLACEMENT OF SETON;  Surgeon: [name], MD;  [facility];  Service: General;  Laterality: N/A;  . RECTAL EXAM UNDER ANESTHESIA N/A 12/01/2014   Procedure: RECTAL EXAM UNDER ANESTHESIA;  Surgeon:  [name], MD;  Location: [place];  Service: General;  Laterality: N/A;  . TOTAL THYROIDECTOMY  10-13-2003  . TRANSTHORACIC ECHOCARDIOGRAM  07-11-2013   mild LVH,  ef 55%,  moderate AR,  mild MR and TR,  trivial PR     reports that she quit smoking about 14 years ago. Her smoking use included Cigarettes. She has a 3.75 pack-year smoking history. She has never used smokeless tobacco. She reports that she does not drink alcohol or use drugs.  She is married.  She has three adult children.  She is retired.  Allergies  Allergen Reactions  . Mercaptopurine Other (See Comments)    Caused pancreatitis  . Remicade [Infliximab] Other (See Comments)    "joint pain"    Family History  Problem Relation Age of Onset  . Hypertension Father   . Heart disease Father   . Heart attack Father   . Diverticulitis Mother   . COPD Mother   . Hypertension Mother   . Heart disease Mother   . Heart attack Mother   . Colon cancer Brother   . Diabetes Brother   . Thyroid disease Brother      Prior to Admission medications   Medication Sig Start Date End Date Taking? Authorizing Provider  acetaminophen (TYLENOL) 500 MG tablet Take 1,000 mg by mouth every 6 (six) hours as needed for mild pain.    Yes Historical Provider, MD  Adalimumab (HUMIRA) 40 MG/0.8ML PSKT Inject 40 mg into the skin every 14 (fourteen) days.   Yes Historical Provider, MD  albuterol (PROVENTIL HFA;VENTOLIN HFA) 108 (90 BASE) MCG/ACT inhaler Inhale 2 puffs into the lungs every 6 (six) hours as needed for wheezing or shortness of breath.   Yes Historical Provider, MD  amoxicillin-clavulanate (AUGMENTIN) 500-125 MG tablet Take 1 tablet by mouth 2 (two) times daily. Started 2/13 x 7 days 07/08/16  Yes Historical Provider, MD  calcitRIOL (ROCALTROL) 0.5 MCG capsule Take 0.5 mcg by mouth daily.   Yes Historical Provider, MD  Cholecalciferol (VITAMIN D3) 2000 UNITS TABS Take 2,000 Units by mouth daily.  04/07/14  Yes  Historical Provider, MD  cyanocobalamin (,VITAMIN B-12,) 1000 MCG/ML injection Inject 1,000 mcg into the muscle every 30 (thirty) days. Last injection was 04/28/12   Yes Historical Provider, MD  Fluticasone-Umeclidin-Vilant (TRELEGY ELLIPTA) 100-62.5-25 MCG/INH AEPB Inhale 1 puff into the lungs daily. 05/01/16  Yes Chesley Mires, MD  folic acid (FOLVITE) 1 MG tablet Take 1 mg by mouth daily.   Yes Historical Provider, MD  furosemide (LASIX) 40 MG tablet Take 40 mg by mouth 3 (three) times daily as needed. 06/26/16  Yes Historical Provider, MD  levothyroxine (SYNTHROID, LEVOTHROID) 125 MCG tablet Take 125 mcg by mouth every morning.    Yes Historical Provider, MD  potassium chloride SA (K-DUR,KLOR-CON) 20 MEQ tablet Take 60 mEq by mouth 4 (four) times  daily.  09/24/15  Yes Historical Provider, MD  predniSONE (DELTASONE) 5 MG tablet Take 5 mg by mouth daily with breakfast.   Yes Historical Provider, MD  promethazine (PHENERGAN) 25 MG tablet Take 25 mg by mouth every 6 (six) hours as needed for nausea or vomiting.  04/19/14  Yes Historical Provider, MD  sodium bicarbonate 650 MG tablet Take 1 tablet (650 mg total) by mouth 2 (two) times daily. Patient taking differently: Take 1,950 mg by mouth 3 (three) times daily.  04/07/14  Yes Albertine Patricia, MD  traMADol (ULTRAM) 50 MG tablet Take 1 tablet (50 mg total) by mouth every 6 (six) hours as needed for severe pain. 03/17/16  Yes Zenovia Jarred, DO    Physical Exam: Vitals:   07/11/16 1632 07/11/16 1954 07/11/16 2016 07/11/16 2139  BP:  128/77 95/71 113/66  Pulse:  98 100 102  Resp:  18 16 16   Temp:      TempSrc:      SpO2:  100% 97% 100%  Weight: 44.5 kg (98 lb)     Height: 5' 4"  (1.626 m)         Constitutional: NAD, calm, somewhat ill appearing Vitals:   07/11/16 1632 07/11/16 1954 07/11/16 2016 07/11/16 2139  BP:  128/77 95/71 113/66  Pulse:  98 100 102  Resp:  18 16 16   Temp:      TempSrc:      SpO2:  100% 97% 100%  Weight: 44.5 kg  (98 lb)     Height: 5' 4"  (1.626 m)      Eyes: PERRL, lids and conjunctivae normal ENMT: Mucous membranes are slightly dry. Posterior pharynx clear of any exudate or lesions. Normal dentition.  Neck: normal appearance, supple, no masses Respiratory: clear to auscultation bilaterally, no wheezing, no crackles. Normal respiratory effort. No accessory muscle use.  Cardiovascular: Normal rate, regular rhythm, no murmurs / rubs / gallops. No extremity edema. 2+ pedal pulses. GI: abdomen is soft and compressible but she has some guarding and tenderness to palpation diffusely.  No distention.  No masses palpated.  Bowel sounds are present. Musculoskeletal:  No joint deformity in upper and lower extremities. Good ROM, no contractures. Normal muscle tone.  Skin: no rashes, warm and dry, ulcer to right foot appears chronic, no active drainage though there is some tenderness with palpation.  No significant warmth or swelling now. Neurologic: CN 2-12 grossly intact. Sensation intact, Strength symmetric bilaterally, 5/5.  Psychiatric: Normal judgment and insight. Alert and oriented x 3. Normal mood.     Labs on Admission: I have personally reviewed following labs and imaging studies  CBC:  Recent Labs Lab 07/11/16 1638  WBC 9.4  HGB 10.6*  HCT 32.6*  MCV 95.9  PLT 921*   Basic Metabolic Panel:  Recent Labs Lab 07/11/16 1638  NA 144  K 2.5*  CL 124*  CO2 13*  GLUCOSE 81  BUN 47*  CREATININE 4.93*  CALCIUM 7.3*   GFR: Estimated Creatinine Clearance: 7.5 mL/min (by C-G formula based on SCr of 4.93 mg/dL (H)). Liver Function Tests:  Recent Labs Lab 07/11/16 1638  AST 15  ALT 11*  ALKPHOS 120  BILITOT 1.0  PROT 6.9  ALBUMIN 3.5    Recent Labs Lab 07/11/16 1638  LIPASE 38   Urine analysis:    Component Value Date/Time   COLORURINE STRAW (A) 07/11/2016 1630   APPEARANCEUR CLEAR 07/11/2016 1630   LABSPEC 1.008 07/11/2016 1630   PHURINE 5.0 07/11/2016 1630  GLUCOSEU  NEGATIVE 07/11/2016 1630   HGBUR MODERATE (A) 07/11/2016 1630   BILIRUBINUR NEGATIVE 07/11/2016 1630   KETONESUR NEGATIVE 07/11/2016 1630   PROTEINUR 30 (A) 07/11/2016 1630   UROBILINOGEN 0.2 09/09/2014 0850   NITRITE NEGATIVE 07/11/2016 1630   LEUKOCYTESUR NEGATIVE 07/11/2016 1630    Radiological Exams on Admission: Ct Abdomen Pelvis Wo Contrast  Result Date: 07/11/2016 CLINICAL DATA:  Initial evaluation for acute abdominal pain, vomiting, loose stools. History of Crohn's disease. EXAM: CT ABDOMEN AND PELVIS WITHOUT CONTRAST TECHNIQUE: Multidetector CT imaging of the abdomen and pelvis was performed following the standard protocol without IV contrast. COMPARISON:  Prior CT from 04/17/2016. FINDINGS: Lower chest: Mild scattered atelectatic changes noted within the visualized lung bases. The visualized lungs are otherwise clear. Trace pericardial effusion noted. Hepatobiliary: Limited noncontrast evaluation of the liver is unremarkable. Gallbladder surgically absent. Prominence of the common bile duct most likely related to post cholecystectomy changes. Pancreas: Pancreas within normal limits without acute inflammatory changes. Mild prominence of the pancreatic duct is stable from previous. Spleen: Spleen within normal limits. Adrenals/Urinary Tract: Adrenal glands are unremarkable. Multiple scattered bilateral nonobstructive calculi present within the kidneys bilaterally. Largest of these on the left is positioned within the lower pole and measures 6 mm. Largest on the right also present within the lower pole and measures 7 mm. No hydronephrosis. Few small superimposed cysts noted within the kidneys bilaterally. No radiopaque calculi seen along either ureter. There is no hydroureter. Bladder within normal limits. Stomach/Bowel: Small diverticulum noted at the gastric fundus without associated inflammation. Stomach otherwise unremarkable. Patient is status post ileocolonic resection with anastomotic  suture within the mid abdomen. No evidence for obstruction. No significant wall thickening or inflammatory stranding about the anastomotic site. There is mild circumferential wall thickening about the distal colon, essentially extending from the anastomosis distally through the rectum, which may reflect mild colitis. No significant perienteric inflammatory stranding. Intraluminal fluid density throughout the colon, suggesting associated diarrheal illness. No other significant inflammatory changes about the bowels. Appendix not visualize, and is likely absent. Vascular/Lymphatic: Moderate aorto bi-iliac atherosclerotic disease. No aneurysm. No adenopathy. Reproductive: Uterus is absent.  Ovaries not discretely identified. Other: No free air or fluid. Musculoskeletal: No acute osseous abnormality. No worrisome lytic or blastic osseous lesions. IMPRESSION: 1. Mild circumferential wall thickening about the colon, suggesting mild colitis. This may be either infectious or inflammatory in nature. Associated intraluminal fluid density throughout the colon suggests associated acute diarrheal illness. 2. No other acute intra-abdominal or pelvic process identified. 3. Bilateral nonobstructive nephrolithiasis as above. 4. Small gastric diverticulum without associated inflammation. 5. Moderate aorto bi-iliac atherosclerotic disease.  No aneurysm. 6. Status post cholecystectomy. Electronically Signed   By: Jeannine Boga M.D.   On: 07/11/2016 23:24    Assessment/Plan Principal Problem:   Hypokalemia Active Problems:   Acute renal failure (HCC)   Dehydration   Crohn's disease s/p Colectomy, Terminal ilectomies.  Managed by [facility]   Metabolic acidosis   Diarrhea   Anemia   CKD (chronic kidney disease) stage 4, GFR 15-29 ml/min (HCC)   Hypocalcemia   Hypothyroidism   Emphysema/COPD (HCC)   Nephrolithiasis   Gastroenteritis, acute      Probable viral gastroenteritis in a patient with a known history of  Crohn's disease.  No fever, leukocytosis, or intractable abdominal pain at this point. --Agree with conservative management for now.  Low threshold for starting IV antibiotics and IV steroids for fever or any sign of decompensation. --HOLD humira.  Continue home dose of prednisone for now. --Clear liquid diet --Cautious IV fluids, D5 with 3 amps of bicarb at 75cc/hr for 10 hours --Strict I/O --HOLD augmentin for now --Stool studies (GI pathogens panel, C diff pcr) --Stool heme occult --Enteric precautions  AKI on CKD 4; secondary to dehydration from GI losses (vomiting, diarrhea).  Nonobstructing nephrolithiasis. --Cautious hydration, will use bicarb infusion due to her degree of metabolic acidosis --Repeat BMP in the AM --Strict I/O --Low threshold for renal consult in the AM --Continue home dose of sodium bicarbonate  Hypokalemia secondary to acute GI losses --56mq ordered in the ED --Will scheduled 423m po daily (which is remarkably less than what is documented on her home med list); I do not feel comfortable ordering higher doses at this time with her degree of renal insufficiency. --BMP in the AM  Chronic anemia --Follow Hgb trend, stool heme occult  Hypothyroidism --Continue home dose of levothyroxine  History of COPD --Hold home inhaler during this observation stay; use duonebs QID.            DVT prophylaxis: SCDs Code Status: FULL Family Communication: Patient alone in the ED at time of admission. Disposition Plan: Expect she will go home when ready for discharge. Consults called: NONE Admission status: Place in observation with telemetry monitoring.   TIME SPENT: 60 minutes   CaEber JonesD [facility] Pager [phone] 7PM-7AM, please contact night-coverage [email or web address] Password [email or web address]2/17/2018, 12:39 AM

## 2016-07-13 ENCOUNTER — Encounter (HOSPITAL_COMMUNITY): Payer: Self-pay | Admitting: Gastroenterology

## 2016-07-13 DIAGNOSIS — E878 Other disorders of electrolyte and fluid balance, not elsewhere classified: Secondary | ICD-10-CM

## 2016-07-13 LAB — MAGNESIUM: MAGNESIUM: 0.9 mg/dL — AB (ref 1.7–2.4)

## 2016-07-13 LAB — CBC WITH DIFFERENTIAL/PLATELET
BASOS ABS: 0 10*3/uL (ref 0.0–0.1)
Basophils Relative: 0 %
Eosinophils Absolute: 0.1 10*3/uL (ref 0.0–0.7)
Eosinophils Relative: 2 %
HCT: 23.7 % — ABNORMAL LOW (ref 36.0–46.0)
Hemoglobin: 8 g/dL — ABNORMAL LOW (ref 12.0–15.0)
Lymphocytes Relative: 32 %
Lymphs Abs: 1.9 10*3/uL (ref 0.7–4.0)
MCH: 31.5 pg (ref 26.0–34.0)
MCHC: 33.8 g/dL (ref 30.0–36.0)
MCV: 93.3 fL (ref 78.0–100.0)
Monocytes Absolute: 0.8 10*3/uL (ref 0.1–1.0)
Monocytes Relative: 13 %
Neutro Abs: 3.3 10*3/uL (ref 1.7–7.7)
Neutrophils Relative %: 54 %
PLATELETS: 93 10*3/uL — AB (ref 150–400)
RBC: 2.54 MIL/uL — AB (ref 3.87–5.11)
RDW: 15.3 % (ref 11.5–15.5)
WBC: 6.2 10*3/uL (ref 4.0–10.5)

## 2016-07-13 LAB — COMPREHENSIVE METABOLIC PANEL
ALT: 9 U/L — ABNORMAL LOW (ref 14–54)
AST: 14 U/L — AB (ref 15–41)
Albumin: 2.6 g/dL — ABNORMAL LOW (ref 3.5–5.0)
Alkaline Phosphatase: 77 U/L (ref 38–126)
Anion gap: 7 (ref 5–15)
BUN: 40 mg/dL — AB (ref 6–20)
CHLORIDE: 113 mmol/L — AB (ref 101–111)
CO2: 22 mmol/L (ref 22–32)
Calcium: 6.2 mg/dL — CL (ref 8.9–10.3)
Creatinine, Ser: 3.94 mg/dL — ABNORMAL HIGH (ref 0.44–1.00)
GFR, EST AFRICAN AMERICAN: 12 mL/min — AB (ref >60)
GFR, EST NON AFRICAN AMERICAN: 11 mL/min — AB (ref >60)
Glucose, Bld: 70 mg/dL (ref 65–99)
Potassium: 3.1 mmol/L — ABNORMAL LOW (ref 3.5–5.1)
SODIUM: 142 mmol/L (ref 135–145)
Total Bilirubin: 1 mg/dL (ref 0.3–1.2)
Total Protein: 5.2 g/dL — ABNORMAL LOW (ref 6.5–8.1)

## 2016-07-13 LAB — PHOSPHORUS: Phosphorus: 1.9 mg/dL — ABNORMAL LOW (ref 2.5–4.6)

## 2016-07-13 MED ORDER — MAGNESIUM SULFATE 4 GM/100ML IV SOLN
4.0000 g | Freq: Once | INTRAVENOUS | Status: AC
  Administered 2016-07-13: 4 g via INTRAVENOUS
  Filled 2016-07-13: qty 100

## 2016-07-13 MED ORDER — SODIUM CHLORIDE 0.9 % IV SOLN
30.0000 meq | Freq: Once | INTRAVENOUS | Status: AC
  Administered 2016-07-13: 30 meq via INTRAVENOUS
  Filled 2016-07-13: qty 15

## 2016-07-13 MED ORDER — PREMIER PROTEIN SHAKE
11.0000 [oz_av] | Freq: Two times a day (BID) | ORAL | Status: DC
  Administered 2016-07-13: 11 [oz_av] via ORAL
  Filled 2016-07-13 (×5): qty 325.31

## 2016-07-13 MED ORDER — SODIUM CHLORIDE 0.9 % IV SOLN
1.0000 g | Freq: Once | INTRAVENOUS | Status: AC
  Administered 2016-07-13: 1 g via INTRAVENOUS
  Filled 2016-07-13: qty 10

## 2016-07-13 MED ORDER — IPRATROPIUM-ALBUTEROL 0.5-2.5 (3) MG/3ML IN SOLN: 3.0000 mL | RESPIRATORY_TRACT | Status: DC | PRN

## 2016-07-13 NOTE — Progress Notes (Signed)
CRITICAL VALUE ALERT  Critical value received:  Mag 0.9, Calcium 6.2  Date of notification:  07/13/16  Time of notification:  [id number]  Critical value read back:yes  Nurse who received alert:  Roxan Hockey, RN  MD notified (1st page:: Alfredia Ferguson  Time of first page:  0659am  MD notified (2nd page):  Time of second page:  Responding MD:    Time MD responded:

## 2016-07-13 NOTE — Progress Notes (Signed)
Alerted MD to Mg and Ca being low this morning. Will await response.

## 2016-07-13 NOTE — Progress Notes (Signed)
Initial Nutrition Assessment  DOCUMENTATION CODES:   Severe malnutrition in context of chronic illness  INTERVENTION:  Premier Protein BID, each supplement provides 160kcal and 30g protein.   snacks  NUTRITION DIAGNOSIS:   Malnutrition related to other (see comment), chronic illness (Crohn's disease ) as evidenced by severe depletion of body fat, severe depletion of muscle mass, 6 percent weight loss in 1 month.  GOAL:   Patient will meet greater than or equal to 90% of their needs  MONITOR:   PO intake, Supplement acceptance, Labs, Weight trends, Skin  REASON FOR ASSESSMENT:   Other (Comment) (low BMI)    ASSESSMENT:   71 y.o. woman with a history of Crohn's disease S/P ileocolonic resection, COPD/emphysema, CKD 4 (baseline creatinine appears to be around 3), chronic anemia, GERD, and hypothyroidism who presents to the ED for evaluation of abdominal pain, nausea, and vomiting that started two days ago. Found to have probable viral gastroenteritis   Met with pt in room today. Pt reports good appetite today and denies nausea or vomiting. Pt reports good appetite up until 2 days pta when she started having nausea and vomiting. Pt with h/o Crohn's disease and reports a long time issue with diarrhea. Pt reports that food just goes right through her and she tries to avoid foods that are high in fiber and sugar. Pt also tries to avoid dairy products. Pt reports that Boost and Ensure upset her stomach. Pt with severe muscle and fat wasting. Per chart, pt has lost 6lbs(6%) in 1 month which is significant. RD suspects that pt may be over-restricting calories from fear of having a Crohn's flare and diarrhea. RD discussed with pt the importance of adequate protein intake to preserve lean muscle mass. Pt is willing to try Premier Protein supplements which have been known to cause less stomach upset that Ensure or Boost. RD will also order snacks for this pt. Premier Protein can be discontinued if  pt reports stomach upset after drinking it. Pt with low potassium, Magnesium, and Phosphorus today. Pt reports that she was told by a MD to avoid Magnesium products because they can cause diarrhea. RD discussed with pt that some Magnesium is needed in our diets and that it is important not to over restrict. Continue to monitor and supplement electrolytes per MD discretion.   Medications reviewed and include: folic acid, synthroid, Mg Sulfate, KCl, Calcitrol, prednisone, Na bicarbonate   Labs reviewed: K 3.1(L), Cl 113(H), BUN 40(H), creat 3.94(H), Ca 6.2(L) adj. 7.32(L), P 1.9(L), Mg 0.9(L), Alb 2.6(L) Hgb 8.0(L), Hct 23.7(L)  Nutrition-Focused physical exam completed. Findings are severe fat depletion, severe muscle depletion, and no edema.   Diet Order:  DIET SOFT Room service appropriate? Yes; Fluid consistency: Thin  Skin:  Reviewed, no issues  Last BM:  2/16  Height:   Ht Readings from Last 1 Encounters:  07/11/16 5' 4"  (1.626 m)    Weight:   Wt Readings from Last 1 Encounters:  07/12/16 97 lb 10.6 oz (44.3 kg)    Ideal Body Weight:  54.5 kg  BMI:  Body mass index is 16.76 kg/m.  Estimated Nutritional Needs:   Kcal:  1300-1600kcal/day   Protein:  66-79g/day   Fluid:  >1.3L/day   EDUCATION NEEDS:   No education needs identified at this time  Koleen Distance, RD, LDN Pager #[phone]

## 2016-07-13 NOTE — Consult Note (Addendum)
Reason for Consult: History of Crohn's abnormal CAT scan Referring Physician: Hospital team  Natasha [name] is an 71 y.o. female.  HPI: Patient seen and examined and case discussed with the hospital team and her hospital computer chart and our office computer chart was reviewed and she's been followed for years by Dr. Wynetta Emery and his been on Humira for some time and low-dose prednisone and she was doing well until she started some antibiotics for a foot ulcer and had increased diarrhea and nausea vomiting and her CAT scan was reviewed as well as previous workup and she is currently doing better and wants to eat and her diarrhea is better but she did miss her Friday Humira shot and has been losing weight and has some concerns about her thyroid but no other complaints  Past Medical History:  Diagnosis Date  . Anal fistula   . Anemia in chronic kidney disease (CKD)   . Arthritis   . Borderline hypertension   . Bulging disc    L3-L4  . Chronic diarrhea    due to crohn's  . CKD (chronic kidney disease), stage IV ([place])    NEPHROLOGIST--  DR MATTINGLY  . Crohn's disease ([place])    chronic ileitis  . Emphysema/COPD ([place])   . GERD (gastroesophageal reflux disease)   . History of glaucoma   . History of pancreatitis    2008--  mercaptopurine  . History of small bowel obstruction    12-03-2010  due to crohn's ileitis  . Hypothyroidism, postsurgical    multinodule w/ hurthle cells  . Nephrolithiasis    bilateral  . Osteoporosis   . Perianal Crohn's disease ([place])   . Polyarthralgia   . Wears partial dentures    upper    Past Surgical History:  Procedure Laterality Date  . ABDOMINAL HYSTERECTOMY  1990   and  Appendectomy  . COLON RESECTION  x3 --  1978,  1987, 1989   ILEAL RESECTION x2/   [place]  . COLONOSCOPY WITH PROPOFOL N/A 09/25/2014   Procedure: COLONOSCOPY WITH PROPOFOL;  Surgeon: Garlan Fair, MD;  Location: [facility];  Service: Endoscopy;  Laterality: N/A;   . ESOPHAGOGASTRODUODENOSCOPY  03/04/2012   Procedure: ESOPHAGOGASTRODUODENOSCOPY (EGD);  Surgeon: Garlan Fair, MD;  Location: [facility];  Service: Endoscopy;  Laterality: N/A;  . EXAMINATION UNDER ANESTHESIA N/A 09/12/2014   Procedure: EXAM UNDER ANESTHESIA;  Surgeon: Rolm Bookbinder, MD;  Location: [facility];  Service: General;  Laterality: N/A;  . FLEXIBLE SIGMOIDOSCOPY  03/04/2012   Procedure: FLEXIBLE SIGMOIDOSCOPY;  Surgeon: Garlan Fair, MD;  Location: [facility];  Service: Endoscopy;  Laterality: N/A;  . GLAUCOMA SURGERY Bilateral   . INCISION AND DRAINAGE PERIRECTAL ABSCESS N/A 09/12/2014   Procedure: IRRIGATION AND DEBRIDEMENT PERIRECTAL ABSCESS;  Surgeon: Rolm Bookbinder, MD;  Location: [facility];  Service: General;  Laterality: N/A;  . NEGATIVE SLEEP STUDY  2008  . PLACEMENT OF SETON N/A 12/01/2014   Procedure: PLACEMENT OF SETON;  Surgeon: [name], MD;  Location: [facility];  Service: General;  Laterality: N/A;  . RECTAL EXAM UNDER ANESTHESIA N/A 12/01/2014   Procedure: RECTAL EXAM UNDER ANESTHESIA;  Surgeon: [name], MD;  Location: [facility];  Service: General;  Laterality: N/A;  . TOTAL THYROIDECTOMY  10-13-2003  . TRANSTHORACIC ECHOCARDIOGRAM  07-11-2013   mild LVH,  ef 55%,  moderate AR,  mild MR and TR,  trivial PR    Family History  Problem Relation Age of  Onset  . Hypertension Father   . Heart disease Father   . Heart attack Father   . Diverticulitis Mother   . COPD Mother   . Hypertension Mother   . Heart disease Mother   . Heart attack Mother   . Colon cancer Brother   . Diabetes Brother   . Thyroid disease Brother     Social History:  reports that she quit smoking about 14 years ago. Her smoking use included Cigarettes. She has a 3.75 pack-year smoking history. She has never used smokeless tobacco. She reports that she does not drink alcohol or use drugs.  Allergies:  Allergies  Allergen Reactions  .  Mercaptopurine Other (See Comments)    Caused pancreatitis  . Remicade [Infliximab] Other (See Comments)    "joint pain"    Medications: I have reviewed the patient's current medications.  Results for orders placed or performed during the hospital encounter of 07/11/16 (from the past 48 hour(s))  Urinalysis, Routine w reflex microscopic     Status: Abnormal   Collection Time: 07/11/16  4:30 PM  Result Value Ref Range   Color, Urine STRAW (A) YELLOW   APPearance CLEAR CLEAR   Specific Gravity, Urine 1.008 1.005 - 1.030   pH 5.0 5.0 - 8.0   Glucose, UA NEGATIVE NEGATIVE mg/dL   Hgb urine dipstick MODERATE (A) NEGATIVE   Bilirubin Urine NEGATIVE NEGATIVE   Ketones, ur NEGATIVE NEGATIVE mg/dL   Protein, ur 30 (A) NEGATIVE mg/dL   Nitrite NEGATIVE NEGATIVE   Leukocytes, UA NEGATIVE NEGATIVE   RBC / HPF 6-30 0 - 5 RBC/hpf   WBC, UA 0-5 0 - 5 WBC/hpf   Bacteria, UA RARE (A) NONE SEEN   Squamous Epithelial / LPF 0-5 (A) NONE SEEN   Mucous PRESENT   Lipase, blood     Status: None   Collection Time: 07/11/16  4:38 PM  Result Value Ref Range   Lipase 38 11 - 51 U/L  Comprehensive metabolic panel     Status: Abnormal   Collection Time: 07/11/16  4:38 PM  Result Value Ref Range   Sodium 144 135 - 145 mmol/L   Potassium 2.5 (LL) 3.5 - 5.1 mmol/L    Comment: CRITICAL RESULT CALLED TO, READ BACK BY AND VERIFIED WITH: NJIHI,O. RN AT 8182 07/11/16 BY THOMPSON,N.    Chloride 124 (H) 101 - 111 mmol/L   CO2 13 (L) 22 - 32 mmol/L   Glucose, Bld 81 65 - 99 mg/dL   BUN 47 (H) 6 - 20 mg/dL   Creatinine, Ser 4.93 (H) 0.44 - 1.00 mg/dL   Calcium 7.3 (L) 8.9 - 10.3 mg/dL   Total Protein 6.9 6.5 - 8.1 g/dL   Albumin 3.5 3.5 - 5.0 g/dL   AST 15 15 - 41 U/L   ALT 11 (L) 14 - 54 U/L   Alkaline Phosphatase 120 38 - 126 U/L   Total Bilirubin 1.0 0.3 - 1.2 mg/dL   GFR calc non Af Amer 8 (L) >60 mL/min   GFR calc Af Amer 9 (L) >60 mL/min    Comment: (NOTE) The eGFR has been calculated using the CKD  EPI equation. This calculation has not been validated in all clinical situations. eGFR's persistently <60 mL/min signify possible Chronic Kidney Disease.    Anion gap 7 5 - 15  CBC     Status: Abnormal   Collection Time: 07/11/16  4:38 PM  Result Value Ref Range   WBC 9.4 4.0 - 10.5  K/uL   RBC 3.40 (L) 3.87 - 5.11 MIL/uL   Hemoglobin 10.6 (L) 12.0 - 15.0 g/dL   HCT 32.6 (L) 36.0 - 46.0 %   MCV 95.9 78.0 - 100.0 fL   MCH 31.2 26.0 - 34.0 pg   MCHC 32.5 30.0 - 36.0 g/dL   RDW 15.6 (H) 11.5 - 15.5 %   Platelets 123 (L) 150 - 400 K/uL  CBC     Status: Abnormal   Collection Time: 07/12/16  6:09 AM  Result Value Ref Range   WBC 7.2 4.0 - 10.5 K/uL   RBC 3.00 (L) 3.87 - 5.11 MIL/uL   Hemoglobin 9.4 (L) 12.0 - 15.0 g/dL   HCT 29.2 (L) 36.0 - 46.0 %   MCV 97.3 78.0 - 100.0 fL   MCH 31.3 26.0 - 34.0 pg   MCHC 32.2 30.0 - 36.0 g/dL   RDW 15.5 11.5 - 15.5 %   Platelets 98 (L) 150 - 400 K/uL    Comment: SPECIMEN CHECKED FOR CLOTS REPEATED TO VERIFY PLATELET COUNT CONFIRMED BY SMEAR   Basic metabolic panel     Status: Abnormal   Collection Time: 07/12/16  6:09 AM  Result Value Ref Range   Sodium 145 135 - 145 mmol/L   Potassium 3.4 (L) 3.5 - 5.1 mmol/L    Comment: DELTA CHECK NOTED REPEATED TO VERIFY SLIGHT HEMOLYSIS    Chloride 126 (H) 101 - 111 mmol/L   CO2 14 (L) 22 - 32 mmol/L   Glucose, Bld 67 65 - 99 mg/dL   BUN 45 (H) 6 - 20 mg/dL   Creatinine, Ser 4.31 (H) 0.44 - 1.00 mg/dL   Calcium 6.6 (L) 8.9 - 10.3 mg/dL   GFR calc non Af Amer 10 (L) >60 mL/min   GFR calc Af Amer 11 (L) >60 mL/min    Comment: (NOTE) The eGFR has been calculated using the CKD EPI equation. This calculation has not been validated in all clinical situations. eGFR's persistently <60 mL/min signify possible Chronic Kidney Disease.    Anion gap 5 5 - 15  CBC with Differential/Platelet     Status: Abnormal   Collection Time: 07/13/16  5:44 AM  Result Value Ref Range   WBC 6.2 4.0 - 10.5 K/uL   RBC  2.54 (L) 3.87 - 5.11 MIL/uL   Hemoglobin 8.0 (L) 12.0 - 15.0 g/dL   HCT 23.7 (L) 36.0 - 46.0 %   MCV 93.3 78.0 - 100.0 fL   MCH 31.5 26.0 - 34.0 pg   MCHC 33.8 30.0 - 36.0 g/dL   RDW 15.3 11.5 - 15.5 %   Platelets 93 (L) 150 - 400 K/uL    Comment: CONSISTENT WITH PREVIOUS RESULT   Neutrophils Relative % 54 %   Neutro Abs 3.3 1.7 - 7.7 K/uL   Lymphocytes Relative 32 %   Lymphs Abs 1.9 0.7 - 4.0 K/uL   Monocytes Relative 13 %   Monocytes Absolute 0.8 0.1 - 1.0 K/uL   Eosinophils Relative 2 %   Eosinophils Absolute 0.1 0.0 - 0.7 K/uL   Basophils Relative 0 %   Basophils Absolute 0.0 0.0 - 0.1 K/uL  Comprehensive metabolic panel     Status: Abnormal   Collection Time: 07/13/16  5:44 AM  Result Value Ref Range   Sodium 142 135 - 145 mmol/L   Potassium 3.1 (L) 3.5 - 5.1 mmol/L   Chloride 113 (H) 101 - 111 mmol/L   CO2 22 22 - 32 mmol/L   Glucose,  Bld 70 65 - 99 mg/dL   BUN 40 (H) 6 - 20 mg/dL   Creatinine, Ser 3.94 (H) 0.44 - 1.00 mg/dL   Calcium 6.2 (LL) 8.9 - 10.3 mg/dL    Comment: CRITICAL RESULT CALLED TO, READ BACK BY AND VERIFIED WITH: L WILLIAMS AT 0656 ON 02.18.2018 BY NBROOKS    Total Protein 5.2 (L) 6.5 - 8.1 g/dL   Albumin 2.6 (L) 3.5 - 5.0 g/dL   AST 14 (L) 15 - 41 U/L   ALT 9 (L) 14 - 54 U/L   Alkaline Phosphatase 77 38 - 126 U/L   Total Bilirubin 1.0 0.3 - 1.2 mg/dL   GFR calc non Af Amer 11 (L) >60 mL/min   GFR calc Af Amer 12 (L) >60 mL/min    Comment: (NOTE) The eGFR has been calculated using the CKD EPI equation. This calculation has not been validated in all clinical situations. eGFR's persistently <60 mL/min signify possible Chronic Kidney Disease.    Anion gap 7 5 - 15  Magnesium     Status: Abnormal   Collection Time: 07/13/16  5:44 AM  Result Value Ref Range   Magnesium 0.9 (LL) 1.7 - 2.4 mg/dL    Comment: CRITICAL RESULT CALLED TO, READ BACK BY AND VERIFIED WITH: L WILLIAMS AT 0656 ON 02.18.2018 BY NBROOKS   Phosphorus     Status: Abnormal    Collection Time: 07/13/16  5:44 AM  Result Value Ref Range   Phosphorus 1.9 (L) 2.5 - 4.6 mg/dL    Ct Abdomen Pelvis Wo Contrast  Result Date: 07/11/2016 CLINICAL DATA:  Initial evaluation for acute abdominal pain, vomiting, loose stools. History of Crohn's disease. EXAM: CT ABDOMEN AND PELVIS WITHOUT CONTRAST TECHNIQUE: Multidetector CT imaging of the abdomen and pelvis was performed following the standard protocol without IV contrast. COMPARISON:  Prior CT from 04/17/2016. FINDINGS: Lower chest: Mild scattered atelectatic changes noted within the visualized lung bases. The visualized lungs are otherwise clear. Trace pericardial effusion noted. Hepatobiliary: Limited noncontrast evaluation of the liver is unremarkable. Gallbladder surgically absent. Prominence of the common bile duct most likely related to post cholecystectomy changes. Pancreas: Pancreas within normal limits without acute inflammatory changes. Mild prominence of the pancreatic duct is stable from previous. Spleen: Spleen within normal limits. Adrenals/Urinary Tract: Adrenal glands are unremarkable. Multiple scattered bilateral nonobstructive calculi present within the kidneys bilaterally. Largest of these on the left is positioned within the lower pole and measures 6 mm. Largest on the right also present within the lower pole and measures 7 mm. No hydronephrosis. Few small superimposed cysts noted within the kidneys bilaterally. No radiopaque calculi seen along either ureter. There is no hydroureter. Bladder within normal limits. Stomach/Bowel: Small diverticulum noted at the gastric fundus without associated inflammation. Stomach otherwise unremarkable. Patient is status post ileocolonic resection with anastomotic suture within the mid abdomen. No evidence for obstruction. No significant wall thickening or inflammatory stranding about the anastomotic site. There is mild circumferential wall thickening about the distal colon, essentially  extending from the anastomosis distally through the rectum, which may reflect mild colitis. No significant perienteric inflammatory stranding. Intraluminal fluid density throughout the colon, suggesting associated diarrheal illness. No other significant inflammatory changes about the bowels. Appendix not visualize, and is likely absent. Vascular/Lymphatic: Moderate aorto bi-iliac atherosclerotic disease. No aneurysm. No adenopathy. Reproductive: Uterus is absent.  Ovaries not discretely identified. Other: No free air or fluid. Musculoskeletal: No acute osseous abnormality. No worrisome lytic or blastic osseous lesions. IMPRESSION: 1. Mild  circumferential wall thickening about the colon, suggesting mild colitis. This may be either infectious or inflammatory in nature. Associated intraluminal fluid density throughout the colon suggests associated acute diarrheal illness. 2. No other acute intra-abdominal or pelvic process identified. 3. Bilateral nonobstructive nephrolithiasis as above. 4. Small gastric diverticulum without associated inflammation. 5. Moderate aorto bi-iliac atherosclerotic disease.  No aneurysm. 6. Status post cholecystectomy. Electronically Signed   By: Jeannine Boga M.D.   On: 07/11/2016 23:24    ROS negative except above Blood pressure 106/75, pulse 87, temperature 98.2 F (36.8 C), temperature source Oral, resp. rate 19, height _0  (1.626 m), weight 44.3 kg (97 lb 10.6 oz), SpO2 97 %. Physical Exam vital signs stable afebrile no acute distress exam pertinent for abdomen being soft nontender good bowel sounds labs and CT reviewed no C. difficile yet although with decreased diarrhea doubt she has that  Assessment/Plan: Crohn's disease with probable superimposed subacute infectious colitis probably medicine-induced Plan: Will ask Dr. Wynetta Emery to see tomorrow unless she's able to be discharged and if doing well should resume Humira and please call us back if we could be of any  further assistance with this hospital stay and will advance to soft diet and can return to home prednisone dose on discharge and we will recheck labs tomorrow including TSH but no obvious sign of significant GI blood loss and episodic guaiac positivity in a patient with Crohn's occurs frequently  Rilley Stash E 07/13/2016, 10:30 AM

## 2016-07-13 NOTE — Progress Notes (Signed)
Please note that first page to MD this morning was at 0658. This MD does not receive pager until 0700. MD received second text and responded with new orders.  Received by Probation officer and noted.

## 2016-07-13 NOTE — Progress Notes (Signed)
PROGRESS NOTE    KIRIANA [name]  VOJ:[id number] DOB: 19-Apr-1946 DOA: 07/11/2016 PCP: Ileana Roup, MD   Brief Narrative:  Natasha [name] is a 71 y.o. woman with a history of Crohn's disease S/P ileocolonic resection, COPD/emphysema, CKD 4 (baseline creatinine appears to be around 3), chronic anemia, GERD, and hypothyroidism who presents to the ED for evaluation of abdominal pain, nausea, and vomiting that started two days ago.  She denies any known sick contacts.  She denies fever or sweats but she has had chills.  In the past day, she has developed loose stools.  Some have been watery; some have been semi-formed and dark (but no black).  She denies evidence of gross blood loss.  She has been light-headed, but no syncope.  She has taken acetaminophen for her abdominal pain. She was recently put on Augmentin for a small ulcer on the fifth toe of her right foot and developed symptoms after. Admitted and slowly improving. Case Discussed with GI and Nephrology.  Assessment & Plan:   Principal Problem:   Hypokalemia Active Problems:   Acute renal failure (HCC)   Dehydration   Crohn's disease without complication (HCC)   Metabolic acidosis   Diarrhea   Anemia   CKD (chronic kidney disease) stage 4, GFR 15-29 ml/min (HCC)   Hypocalcemia   Hypothyroidism   Emphysema/COPD (HCC)   Nephrolithiasis   Gastroenteritis, acute  Probable viral gastroenteritis/subacute colitis in a patient with a known history of Crohn's disease.   -No fever, leukocytosis, or intractable abdominal pain at this point. Likely Medication induced. -CT Abdomen/Pelvis showed Mild circumferential wall thickening about the colon, suggesting mild colitis. This may be either infectious or inflammatory in nature. Associated intraluminal fluid density throughout the colon suggests associated acute diarrheal illness. No other acute intra-abdominal or pelvic process identified.. Bilateral nonobstructive nephrolithiasis as  above. . Small gastric diverticulum without associated inflammation. Moderate aorto bi-iliac atherosclerotic disease.  No aneurysm.  Status post cholecystectomy. --Agree with conservative management for now. Gav IV Methylprednisolone 10 mg 1x dose yesterday --HOLD humira.  Continue home dose of prednisone for now. --Clear liquid diet advance to soft --Cautious IV fluids, D5 with 3 amps of bicarb at 75cc/hr --Strict I/O --HOLD augmentin for now --Cdiff Negative; GI Panel Pending --Stool heme occult Positve --Enteric precautions -Appreciate Gastroenterology Dr. Perley Jain evaluation and reccomendations  -C/w Zofran for N/V -Pain Control with Dilaudid 0.5 mg IV q3hprn and Tramadol 50 mg po q6hprn  AKI on CKD 4; secondary to dehydration from GI losses (vomiting, diarrhea).  Nonobstructing nephrolithiasis. -Improving with D5W + 3 Amps of Bicarb --Repeat BMP in the AM --Strict I/O --Low threshold for renal consult in the AM --Continue home dose of sodium bicarbonate  Hypokalemia/Hypomagnesemia/Hypocalcemia/Hypercholremia secondary to acute GI losses -Repelete Electrolytes as necessary; Given IV Calcium Gluconate, IV Magnesium Sulfate, and po and IV Kcl -C/w Calcitriol -C/w D5W + 3 Amps of Bicarb -Repeat CMP in AM  Chronic Anemia -FOBT Positive;  -Hb/HCt dropped to 8.0/23.7 -Dr. Watt Climes in GI consulted and appreciate evaluation- States episodic Guaiac Positivity in a patient with Crohns -Will discuss with GI about possible Endoscopy with Dr. Wynetta Emery in AM -Repeat CBC in AM  Hypothyroidism -Check TSH and Free T4 --Continue home dose of Levothyroxine  History of COPD --C/w DuoNeb BID  Right Toe Foot Ulcer -Wound Care Evaluation  DVT prophylaxis: SCDs Code Status: FULL CODE Family Communication: Husband at bedside Disposition Plan: Remain Inpatient  Consultants:   Gastroenterology Dr. Watt Climes  Nephrology Dr.  Pearson Grippe via Telephone discussion    Procedures:   None  Antimicrobials:  Anti-infectives    None     Subjective: Seen and examined and was doing a little better. No more diarrhea but still nauseous but improves with zofran. No other concerns or complaints but states she lost weight since 2016 and has not been able to get it back. Mild Abdominal Pain.  Objective: Vitals:   07/12/16 2000 07/12/16 2044 07/13/16 0418 07/13/16 0750  BP:  101/61 106/75   Pulse:  87 87   Resp:  19 19   Temp:  98.5 F (36.9 C) 98.2 F (36.8 C)   TempSrc:  Oral Oral   SpO2: 97% 100% 96% 97%  Weight:      Height:        Intake/Output Summary (Last 24 hours) at 07/13/16 1119 Last data filed at 07/13/16 0835  Gross per 24 hour  Intake          3258.75 ml  Output                0 ml  Net          3258.75 ml   Filed Weights   07/11/16 1632 07/12/16 0159  Weight: 44.5 kg (98 lb) 44.3 kg (97 lb 10.6 oz)   Examination: Physical Exam:  Constitutional: Thin and Cachectic AAF and appears calm and comfortable Eyes: Lids and conjunctivae normal, sclerae anicteric  ENMT: External Ears, Nose appear normal. Grossly normal hearing. Neck: Appears normal, supple, no cervical masses, normal ROM, no appreciable thyromegaly, no JVD Respiratory: Clear to auscultation bilaterally, no wheezing, rales, rhonchi or crackles. Normal respiratory effort and patient is not tachypenic. No accessory muscle use.  Cardiovascular: RRR, no murmurs / rubs / gallops. S1 and S2 auscultated. No extremity edema. Abdomen: Soft, tender to palpate, non-distended. No masses palpated. No appreciable hepatosplenomegaly. Bowel sounds positive x4 and hyperactive.  GU: Deferred. Musculoskeletal: No clubbing / cyanosis of digits/nails. No joint deformity upper and lower extremities. No contractures.  Skin: No rashes, lesions, ulcers on limited skin eval. No induration; Warm and dry.  Neurologic: CN 2-12 grossly intact with no focal deficits.  Romberg sign cerebellar reflexes not assessed.   Psychiatric: Normal judgment and insight. Alert and oriented x 3. Normal mood and appropriate affect.   Data Reviewed: I have personally reviewed following labs and imaging studies  CBC:  Recent Labs Lab 07/11/16 1638 07/12/16 0609 07/13/16 0544  WBC 9.4 7.2 6.2  NEUTROABS  --   --  3.3  HGB 10.6* 9.4* 8.0*  HCT 32.6* 29.2* 23.7*  MCV 95.9 97.3 93.3  PLT 123* 98* 93*   Basic Metabolic Panel:  Recent Labs Lab 07/11/16 1638 07/12/16 0609 07/13/16 0544  NA 144 145 142  K 2.5* 3.4* 3.1*  CL 124* 126* 113*  CO2 13* 14* 22  GLUCOSE 81 67 70  BUN 47* 45* 40*  CREATININE 4.93* 4.31* 3.94*  CALCIUM 7.3* 6.6* 6.2*  MG  --   --  0.9*  PHOS  --   --  1.9*   GFR: Estimated Creatinine Clearance: 9.3 mL/min (by C-G formula based on SCr of 3.94 mg/dL (H)). Liver Function Tests:  Recent Labs Lab 07/11/16 1638 07/13/16 0544  AST 15 14*  ALT 11* 9*  ALKPHOS 120 77  BILITOT 1.0 1.0  PROT 6.9 5.2*  ALBUMIN 3.5 2.6*    Recent Labs Lab 07/11/16 1638  LIPASE 38   No results for input(s): AMMONIA in  the last 168 hours. Coagulation Profile: No results for input(s): INR, PROTIME in the last 168 hours. Cardiac Enzymes: No results for input(s): CKTOTAL, CKMB, CKMBINDEX, TROPONINI in the last 168 hours. BNP (last 3 results) No results for input(s): PROBNP in the last 8760 hours. HbA1C: No results for input(s): HGBA1C in the last 72 hours. CBG: No results for input(s): GLUCAP in the last 168 hours. Lipid Profile: No results for input(s): CHOL, HDL, LDLCALC, TRIG, CHOLHDL, LDLDIRECT in the last 72 hours. Thyroid Function Tests: No results for input(s): TSH, T4TOTAL, FREET4, T3FREE, THYROIDAB in the last 72 hours. Anemia Panel: No results for input(s): VITAMINB12, FOLATE, FERRITIN, TIBC, IRON, RETICCTPCT in the last 72 hours. Sepsis Labs: No results for input(s): PROCALCITON, LATICACIDVEN in the last 168 hours.  No results found for this or any previous visit (from the  past 240 hour(s)).   Radiology Studies: Ct Abdomen Pelvis Wo Contrast  Result Date: 07/11/2016 CLINICAL DATA:  Initial evaluation for acute abdominal pain, vomiting, loose stools. History of Crohn's disease. EXAM: CT ABDOMEN AND PELVIS WITHOUT CONTRAST TECHNIQUE: Multidetector CT imaging of the abdomen and pelvis was performed following the standard protocol without IV contrast. COMPARISON:  Prior CT from 04/17/2016. FINDINGS: Lower chest: Mild scattered atelectatic changes noted within the visualized lung bases. The visualized lungs are otherwise clear. Trace pericardial effusion noted. Hepatobiliary: Limited noncontrast evaluation of the liver is unremarkable. Gallbladder surgically absent. Prominence of the common bile duct most likely related to post cholecystectomy changes. Pancreas: Pancreas within normal limits without acute inflammatory changes. Mild prominence of the pancreatic duct is stable from previous. Spleen: Spleen within normal limits. Adrenals/Urinary Tract: Adrenal glands are unremarkable. Multiple scattered bilateral nonobstructive calculi present within the kidneys bilaterally. Largest of these on the left is positioned within the lower pole and measures 6 mm. Largest on the right also present within the lower pole and measures 7 mm. No hydronephrosis. Few small superimposed cysts noted within the kidneys bilaterally. No radiopaque calculi seen along either ureter. There is no hydroureter. Bladder within normal limits. Stomach/Bowel: Small diverticulum noted at the gastric fundus without associated inflammation. Stomach otherwise unremarkable. Patient is status post ileocolonic resection with anastomotic suture within the mid abdomen. No evidence for obstruction. No significant wall thickening or inflammatory stranding about the anastomotic site. There is mild circumferential wall thickening about the distal colon, essentially extending from the anastomosis distally through the rectum,  which may reflect mild colitis. No significant perienteric inflammatory stranding. Intraluminal fluid density throughout the colon, suggesting associated diarrheal illness. No other significant inflammatory changes about the bowels. Appendix not visualize, and is likely absent. Vascular/Lymphatic: Moderate aorto bi-iliac atherosclerotic disease. No aneurysm. No adenopathy. Reproductive: Uterus is absent.  Ovaries not discretely identified. Other: No free air or fluid. Musculoskeletal: No acute osseous abnormality. No worrisome lytic or blastic osseous lesions. IMPRESSION: 1. Mild circumferential wall thickening about the colon, suggesting mild colitis. This may be either infectious or inflammatory in nature. Associated intraluminal fluid density throughout the colon suggests associated acute diarrheal illness. 2. No other acute intra-abdominal or pelvic process identified. 3. Bilateral nonobstructive nephrolithiasis as above. 4. Small gastric diverticulum without associated inflammation. 5. Moderate aorto bi-iliac atherosclerotic disease.  No aneurysm. 6. Status post cholecystectomy. Electronically Signed   By: Jeannine Boga M.D.   On: 07/11/2016 23:24   Scheduled Meds: . calcitRIOL  0.5 mcg Oral Daily  . folic acid  1 mg Oral Daily  . ipratropium-albuterol  3 mL Nebulization BID  . levothyroxine  125 mcg Oral QAC breakfast  . potassium chloride (KCL MULTIRUN) 30 mEq in 265 mL IVPB  30 mEq Intravenous Once  . potassium chloride SA  40 mEq Oral BID  . predniSONE  5 mg Oral Q breakfast  . sodium bicarbonate  1,950 mg Oral TID  . sodium chloride flush  3 mL Intravenous Q12H   Continuous Infusions: .  sodium bicarbonate  infusion 1000 mL 75 mL/hr at 07/13/16 0657     LOS: 1 day   Kerney Elbe, DO [facility] Pager [phone]  If 7PM-7AM, please contact night-coverage [email or web address] Password [email or web address] 07/13/2016, 11:19 AM

## 2016-07-14 DIAGNOSIS — L8990 Pressure ulcer of unspecified site, unspecified stage: Secondary | ICD-10-CM | POA: Insufficient documentation

## 2016-07-14 DIAGNOSIS — L89899 Pressure ulcer of other site, unspecified stage: Secondary | ICD-10-CM

## 2016-07-14 LAB — CBC WITH DIFFERENTIAL/PLATELET
Basophils Absolute: 0 10*3/uL (ref 0.0–0.1)
Basophils Relative: 0 %
EOS ABS: 0.2 10*3/uL (ref 0.0–0.7)
Eosinophils Relative: 3 %
HCT: 23.8 % — ABNORMAL LOW (ref 36.0–46.0)
Hemoglobin: 7.8 g/dL — ABNORMAL LOW (ref 12.0–15.0)
LYMPHS ABS: 2 10*3/uL (ref 0.7–4.0)
LYMPHS PCT: 36 %
MCH: 30.6 pg (ref 26.0–34.0)
MCHC: 32.8 g/dL (ref 30.0–36.0)
MCV: 93.3 fL (ref 78.0–100.0)
Monocytes Absolute: 0.7 10*3/uL (ref 0.1–1.0)
Monocytes Relative: 13 %
NEUTROS PCT: 48 %
Neutro Abs: 2.7 10*3/uL (ref 1.7–7.7)
Platelets: 99 10*3/uL — ABNORMAL LOW (ref 150–400)
RBC: 2.55 MIL/uL — ABNORMAL LOW (ref 3.87–5.11)
RDW: 15.1 % (ref 11.5–15.5)
WBC: 5.6 10*3/uL (ref 4.0–10.5)

## 2016-07-14 LAB — GASTROINTESTINAL PANEL BY PCR, STOOL (REPLACES STOOL CULTURE)
Adenovirus F40/41: NOT DETECTED
Astrovirus: NOT DETECTED
CRYPTOSPORIDIUM: NOT DETECTED
CYCLOSPORA CAYETANENSIS: NOT DETECTED
Campylobacter species: NOT DETECTED
E. COLI O157: NOT DETECTED
ENTEROAGGREGATIVE E COLI (EAEC): NOT DETECTED
Entamoeba histolytica: NOT DETECTED
Enteropathogenic E coli (EPEC): NOT DETECTED
Enterotoxigenic E coli (ETEC): NOT DETECTED
GIARDIA LAMBLIA: NOT DETECTED
Norovirus GI/GII: NOT DETECTED
Plesimonas shigelloides: NOT DETECTED
Rotavirus A: NOT DETECTED
SALMONELLA SPECIES: NOT DETECTED
SAPOVIRUS (I, II, IV, AND V): NOT DETECTED
SHIGELLA/ENTEROINVASIVE E COLI (EIEC): NOT DETECTED
Shiga like toxin producing E coli (STEC): NOT DETECTED
VIBRIO SPECIES: NOT DETECTED
Vibrio cholerae: NOT DETECTED
YERSINIA ENTEROCOLITICA: NOT DETECTED

## 2016-07-14 LAB — C DIFFICILE QUICK SCREEN W PCR REFLEX
C Diff antigen: NEGATIVE
C Diff interpretation: NOT DETECTED
C Diff toxin: NEGATIVE

## 2016-07-14 LAB — TSH: TSH: 3.59 u[IU]/mL (ref 0.350–4.500)

## 2016-07-14 LAB — PHOSPHORUS: Phosphorus: 1.6 mg/dL — ABNORMAL LOW (ref 2.5–4.6)

## 2016-07-14 LAB — COMPREHENSIVE METABOLIC PANEL
ALT: 10 U/L — ABNORMAL LOW (ref 14–54)
AST: 17 U/L (ref 15–41)
Albumin: 2.5 g/dL — ABNORMAL LOW (ref 3.5–5.0)
Alkaline Phosphatase: 79 U/L (ref 38–126)
Anion gap: 6 (ref 5–15)
BUN: 35 mg/dL — ABNORMAL HIGH (ref 6–20)
CO2: 28 mmol/L (ref 22–32)
CREATININE: 3.53 mg/dL — AB (ref 0.44–1.00)
Calcium: 6.9 mg/dL — ABNORMAL LOW (ref 8.9–10.3)
Chloride: 109 mmol/L (ref 101–111)
GFR calc Af Amer: 14 mL/min — ABNORMAL LOW (ref >60)
GFR, EST NON AFRICAN AMERICAN: 12 mL/min — AB (ref >60)
GLUCOSE: 80 mg/dL (ref 65–99)
Potassium: 3.5 mmol/L (ref 3.5–5.1)
SODIUM: 143 mmol/L (ref 135–145)
Total Bilirubin: 0.3 mg/dL (ref 0.3–1.2)
Total Protein: 5 g/dL — ABNORMAL LOW (ref 6.5–8.1)

## 2016-07-14 LAB — OCCULT BLOOD X 1 CARD TO LAB, STOOL: Fecal Occult Bld: POSITIVE — AB

## 2016-07-14 LAB — MAGNESIUM: Magnesium: 2 mg/dL (ref 1.7–2.4)

## 2016-07-14 LAB — T4, FREE: Free T4: 1.25 ng/dL — ABNORMAL HIGH (ref 0.61–1.12)

## 2016-07-14 MED ORDER — POTASSIUM CHLORIDE CRYS ER 20 MEQ PO TBCR
40.0000 meq | EXTENDED_RELEASE_TABLET | Freq: Every day | ORAL | Status: DC
  Administered 2016-07-14 – 2016-07-15 (×2): 40 meq via ORAL
  Filled 2016-07-14 (×2): qty 2

## 2016-07-14 MED ORDER — SODIUM CHLORIDE 0.9 % IV SOLN
1.0000 g | Freq: Once | INTRAVENOUS | Status: AC
  Administered 2016-07-14: 1 g via INTRAVENOUS
  Filled 2016-07-14: qty 10

## 2016-07-14 MED ORDER — K PHOS MONO-SOD PHOS DI & MONO 155-852-130 MG PO TABS
500.0000 mg | ORAL_TABLET | Freq: Three times a day (TID) | ORAL | Status: DC
  Administered 2016-07-14 (×2): 500 mg via ORAL
  Filled 2016-07-14 (×3): qty 2

## 2016-07-14 MED ORDER — PHENOL 1.4 % MT LIQD
1.0000 | OROMUCOSAL | Status: DC | PRN
  Administered 2016-07-14: 1 via OROMUCOSAL
  Filled 2016-07-14 (×2): qty 177

## 2016-07-14 NOTE — Progress Notes (Signed)
Problems: Nausea, vomiting, watery diarrhea are resolving. Normocytic anemia. Thrombocytopenia. Hypomagnesemia. Colonic wall thickening by CT scan. Chronic vitamin B 12 deficiency.  History: The patient is a 71 year old female born 1946/05/07. 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 She chronically takes subcutaneous Humira every 2 weeks and oral prednisone 5 mg daily. 04/02/2016 barium upper GI x-ray and small bowel follow-through x-ray were normal. 03/04/2012 diagnostic esophagogastroduodenoscopy showed superficial erosions in the duodenal bulb but otherwise was a normal exam. 09/09/2014 ischiorectal abscess was drained surgically. 09/25/2014 colonoscopy to the ileo-right colonic surgical anastomosis was performed with removal of a 7 mm adenomatous ascending colon polyp.  The patient was admitted to the hospital to evaluate and treat acute nausea, vomiting, and watery diarrhea which developed shortly after she started taking Augmentin for a skin infection.  Stool screen for C. difficile toxin and GI pathogen panel are pending.  The patient continues to have mild nausea without vomiting. She has had only one soft stool today.  The patient probably has a resolving viral gastroenteritis but remains in enteric isolation until Clostridium difficile colitis has been ruled out.  Past medical history: Chronic Crohn's ileitis (fistulizing phenotype) complicated by vitamin B 12 deficiency. Chronic oral prednisone. History of adenomatous colon polyp removed colonoscopically in 2016. Schedule surveillance colonoscopy in May 2021. Stage IV chronic kidney disease complicated by proteinuria, secondary hyperparathyroidism, and metabolic acidosis. Primary hypothyroidism. Hypertension. Pancreas divisum. Osteoporosis. Kidney stones. Glaucoma. Terminal ileal resections were performed in 1978, 1987, 1989. Chronic obstructive pulmonary disease. Appendectomy. Total abdominal hysterectomy. Cholecystectomy.  Thyroidectomy. Left leg deep venous thrombosis was diagnosed in 2015.  Family history: Brother was diagnosed with colon cancer under the age of 88  Allergies: Remicade caused joint pains. Mercaptopurine caused pancreatitis.  Surgeon: Dr. [name]  Nephrologist: Dr. Fleet Contras

## 2016-07-14 NOTE — Progress Notes (Signed)
PROGRESS NOTE    Natasha [name]  OAC:[id number] DOB: Aug 17, 1945 DOA: 07/11/2016 PCP: Ileana Roup, MD   Brief Narrative:  Natasha [name] is a 71 y.o. woman with a history of Crohn's disease S/P ileocolonic resection, COPD/emphysema, CKD 4 (baseline creatinine appears to be around 3), chronic anemia, GERD, and hypothyroidism who presents to the ED for evaluation of abdominal pain, nausea, and vomiting that started two days ago.  She denies any known sick contacts.  She denies fever or sweats but she has had chills.  In the past day, she has developed loose stools.  Some have been watery; some have been semi-formed and dark (but no black).  She denies evidence of gross blood loss.  She has been light-headed, but no syncope.  She has taken acetaminophen for her abdominal pain. She was recently put on Augmentin for a small ulcer on the fifth toe of her right foot and developed symptoms after. Admitted and slowly improving. Case Discussed with GI and Nephrology.  Assessment & Plan:   Principal Problem:   Hypokalemia Active Problems:   Acute renal failure (HCC)   Dehydration   Crohn's disease without complication (HCC)   Metabolic acidosis   Diarrhea   Anemia   CKD (chronic kidney disease) stage 4, GFR 15-29 ml/min (HCC)   Hypocalcemia   Hypothyroidism   Emphysema/COPD (HCC)   Nephrolithiasis   Gastroenteritis, acute   Pressure injury of skin  Probable viral gastroenteritis/subacute colitis in a patient with a known history of Crohn's disease.   -No fever, leukocytosis, or intractable abdominal pain at this point. Likely Medication induced. -CT Abdomen/Pelvis showed Mild circumferential wall thickening about the colon, suggesting mild colitis. This may be either infectious or inflammatory in nature. Associated intraluminal fluid density throughout the colon suggests associated acute diarrheal illness. No other acute intra-abdominal or pelvic process identified.. Bilateral  nonobstructive nephrolithiasis as above. . Small gastric diverticulum without associated inflammation. Moderate aorto bi-iliac atherosclerotic disease.  No aneurysm.  Status post cholecystectomy. --Agree with conservative management for now. Gav IV Methylprednisolone 10 mg 1x dose yesterday --HOLD humira and restart as an outpatient.  Continue home dose of prednisone for now. --Clear liquid diet advance to soft --D/C'd IV fluids, D5 with 3 amps of bicarb at 75cc/hr --Strict I/O --HOLD augmentin for now --Cdiff Negative; GI Panel Pending --Stool heme occult Positve x 2 --Enteric precautions -Appreciate Gastroenterology Dr. Hassell Done evaluation and reccomendations  -C/w Zofran for N/V -Pain Control with Dilaudid 0.5 mg IV q3hprn and Tramadol 50 mg po q6hprn -If patient stable can likely D/C in AM  AKI on CKD 4; secondary to dehydration from GI losses (vomiting, diarrhea).  Nonobstructing nephrolithiasis. -Improved with D5W + 3 Amps of Bicarb; BUN/Cr now 35/3.53 --Repeat BMP in the AM --Strict I/O --Low threshold for renal consult in the AM --Continue home dose of sodium bicarbonate  Hypokalemia/Hypomagnesemia/Hypocalcemia/Hypercholremia secondary to acute GI losses -Improving -Repelete Electrolytes as necessary; Given IV Calcium Gluconate again and K Phos -C/w Calcitriol -D/C'dD5W + 3 Amps of Bicarb -Repeat CMP in AM  Chronic Anemia -FOBT Positive;  -Hb/HCt dropped to 7.8/23.8 -Dr. Wynetta Emery in GI consulted and appreciate evaluation- States episodic Guaiac Positivity in a patient with Crohns; No Active Hematemesis, Melena or Hematochezia -Per Dr. Wynetta Emery can be D/C'd in AM if stable  -Repeat CBC in AM  Hypothyroidism -TSH was 3.590 and Free T4 was 1.25 --Continue home dose of Levothyroxine -Outpatient Follow up with PCP for adjustments and repeat TSH/Free T4 in 6 weeks  History of COPD --C/w DuoNeb BID  Right Toe Foot Ulcer -Wound Care Evaluation appreciated -Recommends  Betadine Swabstick BID and allowing to Air-Dry and after drying a dry dressing to be applied -Outpatient Follow Up with Dr. Sharol Given in Orthopedics or Podiatry for continuing Care  DVT prophylaxis: SCDs Code Status: FULL CODE Family Communication: Husband at bedside Disposition Plan: Remain Inpatient  Consultants:   Gastroenterology Dr. Magod/Dr. Earle Gell  Nephrology Dr. Pearson Grippe via Telephone discussion    Procedures:  None  Antimicrobials:  Anti-infectives    None     Subjective: Seen and examined and was doing a little better; Still had nausea and had 1 loose/soft stool this AM which was normal colored. No other concerns or complaints at this time except that she had Right shoulder pain. Feels as if she is improving.   Objective: Vitals:   07/13/16 1946 07/13/16 2050 07/14/16 0522 07/14/16 1446  BP:  107/69 96/69 104/63  Pulse:  93 92 92  Resp:  20 17 18   Temp:  98.7 F (37.1 C) 98.6 F (37 C) 98.1 F (36.7 C)  TempSrc:  Oral Oral Oral  SpO2: 97% 94% 93% 96%  Weight:      Height:        Intake/Output Summary (Last 24 hours) at 07/14/16 2051 Last data filed at 07/14/16 1831  Gross per 24 hour  Intake          1698.75 ml  Output                0 ml  Net          1698.75 ml   Filed Weights   07/11/16 1632 07/12/16 0159  Weight: 44.5 kg (98 lb) 44.3 kg (97 lb 10.6 oz)   Examination: Physical Exam:  Constitutional: Thin and Cachectic AAF and appears calm and comfortable Eyes: Lids and conjunctivae normal, sclerae anicteric  ENMT: External Ears, Nose appear normal. Grossly normal hearing. Neck: Appears normal, supple, no cervical masses, normal ROM, no appreciable thyromegaly, no JVD Respiratory: Clear to auscultation bilaterally, no wheezing, rales, rhonchi or crackles. Normal respiratory effort and patient is not tachypenic. No accessory muscle use.  Cardiovascular: RRR, no murmurs / rubs / gallops. S1 and S2 auscultated. No extremity edema.    Abdomen: Soft, tender to palpate, non-distended. No masses palpated. No appreciable hepatosplenomegaly. Bowel sounds positive x4 and hyperactive.  GU: Deferred. Musculoskeletal: No clubbing / cyanosis of digits/nails. No joint deformity upper and lower extremities. No contractures. Right Shoulder had Heating pack on it.  Skin: No rashes, lesions, ulcers on limited skin eval. No induration; Warm and dry.  Neurologic: CN 2-12 grossly intact with no focal deficits.  Romberg sign cerebellar reflexes not assessed.  Psychiatric: Normal judgment and insight. Alert and oriented x 3. Normal mood and appropriate affect.   Data Reviewed: I have personally reviewed following labs and imaging studies  CBC:  Recent Labs Lab 07/11/16 1638 07/12/16 0609 07/13/16 0544 07/14/16 0541  WBC 9.4 7.2 6.2 5.6  NEUTROABS  --   --  3.3 2.7  HGB 10.6* 9.4* 8.0* 7.8*  HCT 32.6* 29.2* 23.7* 23.8*  MCV 95.9 97.3 93.3 93.3  PLT 123* 98* 93* 99*   Basic Metabolic Panel:  Recent Labs Lab 07/11/16 1638 07/12/16 0609 07/13/16 0544 07/14/16 0541  NA 144 145 142 143  K 2.5* 3.4* 3.1* 3.5  CL 124* 126* 113* 109  CO2 13* 14* 22 28  GLUCOSE 81 67 70 80  BUN  47* 45* 40* 35*  CREATININE 4.93* 4.31* 3.94* 3.53*  CALCIUM 7.3* 6.6* 6.2* 6.9*  MG  --   --  0.9* 2.0  PHOS  --   --  1.9* 1.6*   GFR: Estimated Creatinine Clearance: 10.4 mL/min (by C-G formula based on SCr of 3.53 mg/dL (H)). Liver Function Tests:  Recent Labs Lab 07/11/16 1638 07/13/16 0544 07/14/16 0541  AST 15 14* 17  ALT 11* 9* 10*  ALKPHOS 120 77 79  BILITOT 1.0 1.0 0.3  PROT 6.9 5.2* 5.0*  ALBUMIN 3.5 2.6* 2.5*    Recent Labs Lab 07/11/16 1638  LIPASE 38   No results for input(s): AMMONIA in the last 168 hours. Coagulation Profile: No results for input(s): INR, PROTIME in the last 168 hours. Cardiac Enzymes: No results for input(s): CKTOTAL, CKMB, CKMBINDEX, TROPONINI in the last 168 hours. BNP (last 3 results) No  results for input(s): PROBNP in the last 8760 hours. HbA1C: No results for input(s): HGBA1C in the last 72 hours. CBG: No results for input(s): GLUCAP in the last 168 hours. Lipid Profile: No results for input(s): CHOL, HDL, LDLCALC, TRIG, CHOLHDL, LDLDIRECT in the last 72 hours. Thyroid Function Tests:  Recent Labs  07/14/16 0541  TSH 3.590  FREET4 1.25*   Anemia Panel: No results for input(s): VITAMINB12, FOLATE, FERRITIN, TIBC, IRON, RETICCTPCT in the last 72 hours. Sepsis Labs: No results for input(s): PROCALCITON, LATICACIDVEN in the last 168 hours.  Recent Results (from the past 240 hour(s))  C difficile quick scan w PCR reflex     Status: None   Collection Time: 07/14/16 12:34 PM  Result Value Ref Range Status   C Diff antigen NEGATIVE NEGATIVE Final   C Diff toxin NEGATIVE NEGATIVE Final   C Diff interpretation No C. difficile detected.  Final    Radiology Studies: No results found. Scheduled Meds: . calcitRIOL  0.5 mcg Oral Daily  . folic acid  1 mg Oral Daily  . levothyroxine  125 mcg Oral QAC breakfast  . potassium chloride SA  40 mEq Oral Daily  . predniSONE  5 mg Oral Q breakfast  . protein supplement shake  11 oz Oral BID BM  . sodium bicarbonate  1,950 mg Oral TID  . sodium chloride flush  3 mL Intravenous Q12H   Continuous Infusions:   LOS: 2 days   Kerney Elbe, DO [facility] Pager [phone]  If 7PM-7AM, please contact night-coverage [email or web address] Password [facility] 07/14/2016, 8:51 PM

## 2016-07-14 NOTE — Consult Note (Addendum)
[facility] Nurse wound consult note Reason for Consult: Full thickness ulcer on lateral edge of right foot, 5th digit Wound type:neuropathic, trauma Pressure Injury POA: No. Wound is present on admission, but is not a pressure injury Measurement:1cm round with 0.2cm darker area in center. Wound NBV:APOLIDCV by the presence of necrotic tissue Drainage (amount, consistency, odor) none Periwound:intact, dry. No erythema, no warmth Dressing procedure/placement/frequency: I will initiate Nursing orders for painting this area with a betadine swabstick twice daily and allowing it to air-dry.  Following the drying, a dry dressing will be applied.  Suggest consultation as an outpatient with orthopedics (Dr. Eather Colas) or podiatry for continuing care. Patient will require orthotics recommendation for footwear that has a wide and high toebox to avoid putting pressure on this area.  If you agree, please initiate referral. [facility] nursing team will not follow, but will remain available to this patient, the nursing and medical teams.  Please re-consult if needed. Thanks, Maudie Flakes, MSN, RN, [place], Arther Abbott  Pager# [phone]

## 2016-07-15 LAB — CBC WITH DIFFERENTIAL/PLATELET
BASOS ABS: 0 10*3/uL (ref 0.0–0.1)
Basophils Relative: 0 %
EOS ABS: 0.2 10*3/uL (ref 0.0–0.7)
Eosinophils Relative: 3 %
HCT: 25.4 % — ABNORMAL LOW (ref 36.0–46.0)
Hemoglobin: 8.3 g/dL — ABNORMAL LOW (ref 12.0–15.0)
LYMPHS ABS: 2.7 10*3/uL (ref 0.7–4.0)
Lymphocytes Relative: 36 %
MCH: 32 pg (ref 26.0–34.0)
MCHC: 32.7 g/dL (ref 30.0–36.0)
MCV: 98.1 fL (ref 78.0–100.0)
Monocytes Absolute: 0.7 10*3/uL (ref 0.1–1.0)
Monocytes Relative: 9 %
Neutro Abs: 3.9 10*3/uL (ref 1.7–7.7)
Neutrophils Relative %: 52 %
Platelets: 105 10*3/uL — ABNORMAL LOW (ref 150–400)
RBC: 2.59 MIL/uL — AB (ref 3.87–5.11)
RDW: 15.2 % (ref 11.5–15.5)
WBC: 7.5 10*3/uL (ref 4.0–10.5)

## 2016-07-15 LAB — COMPREHENSIVE METABOLIC PANEL
ALK PHOS: 75 U/L (ref 38–126)
ALT: 10 U/L — ABNORMAL LOW (ref 14–54)
AST: 19 U/L (ref 15–41)
Albumin: 2.5 g/dL — ABNORMAL LOW (ref 3.5–5.0)
Anion gap: 7 (ref 5–15)
BUN: 27 mg/dL — AB (ref 6–20)
CALCIUM: 7.9 mg/dL — AB (ref 8.9–10.3)
CHLORIDE: 109 mmol/L (ref 101–111)
CO2: 26 mmol/L (ref 22–32)
CREATININE: 2.99 mg/dL — AB (ref 0.44–1.00)
GFR calc non Af Amer: 15 mL/min — ABNORMAL LOW (ref >60)
GFR, EST AFRICAN AMERICAN: 17 mL/min — AB (ref >60)
Glucose, Bld: 75 mg/dL (ref 65–99)
Potassium: 3.5 mmol/L (ref 3.5–5.1)
SODIUM: 142 mmol/L (ref 135–145)
Total Bilirubin: 0.6 mg/dL (ref 0.3–1.2)
Total Protein: 4.8 g/dL — ABNORMAL LOW (ref 6.5–8.1)

## 2016-07-15 LAB — MAGNESIUM: Magnesium: 1.7 mg/dL (ref 1.7–2.4)

## 2016-07-15 LAB — PHOSPHORUS: PHOSPHORUS: 2.2 mg/dL — AB (ref 2.5–4.6)

## 2016-07-15 MED ORDER — POTASSIUM PHOSPHATE MONOBASIC 500 MG PO TABS
500.0000 mg | ORAL_TABLET | Freq: Three times a day (TID) | ORAL | Status: DC
  Administered 2016-07-15: 500 mg via ORAL
  Filled 2016-07-15 (×2): qty 1

## 2016-07-15 MED ORDER — PREMIER PROTEIN SHAKE: 11.0000 [oz_av] | Freq: Two times a day (BID) | ORAL | 0 refills | Status: DC

## 2016-07-15 MED ORDER — FUROSEMIDE 40 MG PO TABS
20.0000 mg | ORAL_TABLET | Freq: Three times a day (TID) | ORAL | 0 refills | Status: DC | PRN
Start: 2016-07-15 — End: 2017-01-09

## 2016-07-15 NOTE — Progress Notes (Signed)
Problems: Resolving gastroenteritis. Normocytic anemia. Heme positive stool. Thrombocytopenia.  Stool screen for C. difficile toxin is negative. GI pathogen panel is normal. Hemoglobin is up to 8.3 g. Platelet count is increasing.  Assessment: The patient can be discharged from the hospital on her chronic medications assuming she is consuming a regular diet without further nausea, vomiting, and diarrhea without further gastrointestinal evaluation at this time.

## 2016-07-15 NOTE — Progress Notes (Signed)
Pt is being discharged to home today. Discharge instructions were gone over with patient and prescriptions were given to patient. IV was discontinued. Patient awaiting husband for transportation

## 2016-07-15 NOTE — Discharge Summary (Addendum)
Physician Discharge Summary  Natasha [name] XNT:[id number] DOB: [birth year] DOA: 07/11/2016  PCP: Ileana Roup, MD  Admit date: 07/11/2016 Discharge date: 07/15/2016  Admitted From: Home Disposition:  Home  Recommendations for Outpatient Follow-up:  1. Follow up with PCP in 1-2 weeks 2. Follow up with Dr. Earle Gell in Gastroenterology as an outpatient 3. Follow up with Dr. Mercy Moore in Nephrology as an outpatient  4. Follow up with Orthopedics Dr. Sharol Given or Podiatry for Right 5th Toe Ulcer 5. Follow up with PCP to discuss lower extremity doppler results 6. Please obtain BMP/CBC in one week 7. Repeat TSH and Free T4 8. Please follow up on the following pending results:  Home Health: No Equipment/Devices: None  Discharge Condition: Stable CODE STATUS: FULL CODE Diet recommendation: Heart Healthy    Brief/Interim Summary: Natasha Criss Franklinis a 71 y.o.woman with a history of Crohn's disease S/P ileocolonic resection, COPD/emphysema, CKD 4 (baseline creatinine appears to be around 3), chronic anemia, GERD, and hypothyroidism who presents to the ED for evaluation of abdominal pain, nausea, and vomiting that started two days ago. She denies any known sick contacts. She denies fever or sweats but she has had chills. In the past day, she has developed loose stools. Some have been watery; some have been semi-formed and dark (but no black). She denies evidence of gross blood loss. She has been light-headed, but no syncope. She has taken acetaminophen for her abdominal pain. She was recently put on Augmentin for a small ulcer on the fifth toe of her right foot and developed symptoms after. Admitted and slowly improving. Case Discussed with GI and Nephrology. Patient's Abdominal Pain and N/V resolved and electrolytes corrected after repletion. She tested positive for FOBT but did not have any melena, hematochezia, or hematemesis and Hb/Hct improved and was stable. GI recommended no  further intervention and at this time she was deemed medically stable to be D/C'd Home at this time and follow up with PCP, Nephrology, Gastroenterology, and with Orthopedics Dr. Sharol Given or Podiatry for Right toe ulcer.   Discharge Diagnoses:  Principal Problem:   Hypokalemia Active Problems:   Acute renal failure (HCC)   Dehydration   Crohn's disease without complication (HCC)   Metabolic acidosis   Diarrhea   Anemia   CKD (chronic kidney disease) stage 4, GFR 15-29 ml/min (HCC)   Hypocalcemia   Hypothyroidism   Emphysema/COPD (HCC)   Nephrolithiasis   Gastroenteritis, acute   Pressure injury of skin  Probable viral gastroenteritis/subacute colitis in a patient with a known history of Crohn's disease.  -No fever, leukocytosis, or intractable abdominal pain at this point. Likely Medication induced. -CT Abdomen/Pelvis showed Mild circumferential wall thickening about the colon, suggesting mild colitis. This may be either infectious or inflammatory in nature. Associated intraluminal fluid density throughout the colon suggests associated acute diarrheal illness. No other acute intra-abdominal or pelvic process identified.. Bilateral nonobstructive nephrolithiasis as above. . Small gastric diverticulum without associated inflammation. Moderate aorto bi-iliac atherosclerotic disease. No aneurysm.  Status post cholecystectomy. --Agree with conservative management for now. Gav IV Methylprednisolone 10 mg 1x dose yesterday --HOLD humira and restart as an outpatient. Continue home dose of prednisone for now. --Diet advanced to Soft with no recurrent Nausea or Vomiting  --D/C'd IV fluids, D5 with 3 amps of bicarb at 75cc/hr --Strict I/O --D/C'd Augmentin for now --Cdiff Negative; GI Panel Negative --Stool heme occult Positve x 2 --Enteric precautions D/C'd -Appreciate Gastroenterology Dr. Hassell Done evaluation and reccomendations  -Pain Control with Dilaudid  0.5 mg IV q3hprn and Tramadol 50 mg po  q6hprn while in the hospital -Stable to D/C   AKI on CKD 4; secondary to dehydration from GI losses (vomiting, diarrhea). Nonobstructing nephrolithiasis. -Improved s/p D5W + 3 Amps of Bicarb; BUN/Cr now 27/2.99 --Repeat BMP as an outpatient  --Strict I/O --Continue home dose of sodium bicarbonate --Follow up with Nephrology Dr. Mercy Moore as an outpatient.   Hypokalemia/Hypomagnesemia/Hypocalcemia/Hypercholremia secondary to acute GI losses -Improved -Repelete Electrolytes as necessary; Given IV Calcium Gluconate again and K Phos -C/w Calcitriol -D/C'dD5W + 3 Amps of Bicarb -Repeat CMP as an outpatient  Chronic Anemia -FOBT Positive x2;  -Hb/HCt stable at 8.3/25.4 -Dr. Wynetta Emery in GI consulted and appreciate evaluation- States episodic Guaiac Positivity in a patient with Crohns; No Active Hematemesis, Melena or Hematochezia -Per Dr. Wynetta Emery can be D/C'd   -Repeat CBC as an outpatient and follow up with Gastroenterology as an outpatient   Hypothyroidism -TSH was 3.590 and Free T4 was 1.25 --Continue home dose of Levothyroxine -Outpatient Follow up with PCP for adjustments and repeat TSH/Free T4 in 6 weeks  History of COPD --C/w DuoNeb BID  Right Toe Foot Ulcer -Wound Care Evaluation appreciated -Recommends Betadine Swabstick BID and allowing to Air-Dry and after drying a dry dressing to be applied -Outpatient Follow Up with Dr. 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 in the Mid/Distal Thigh as well as Popliteal Vein -Bilateral Venous Lower Ultrasound report reviewed and discussed with patient (over the phone) -Hold off on Anticoagulation currently as patient was FOBT Positive -Discuss with PCP about starting Anticoagulation as an outpatient   Severe Malnutrition in the context of Chronic Illness -Nutritionist Evaluation appreciated -Body mass index is 16.76 kg/m -C/w Protein Supplement Shakes  (premier Protein) 325 mL po BID between Meals  Discharge Instructions  Discharge Instructions    Call MD for:  difficulty breathing, headache or visual disturbances    Complete by:  As directed    Call MD for:  extreme fatigue    Complete by:  As directed    Call MD for:  persistant dizziness or light-headedness    Complete by:  As directed    Call MD for:  persistant nausea and vomiting    Complete by:  As directed    Call MD for:  severe uncontrolled pain    Complete by:  As directed    Call MD for:  temperature >100.4    Complete by:  As directed    Diet - low sodium heart healthy    Complete by:  As directed    Discharge instructions    Complete by:  As directed    Follow up with PCP, Nephrology, and Gastroenterology, and Orthopedics/Podiatry as an outpatient. Take all medications as prescribed. If symptoms worsen or change please return to the ED for evaluation.   Increase activity slowly    Complete by:  As directed      Allergies as of 07/15/2016      Reactions   Mercaptopurine Other (See Comments)   Caused pancreatitis   Remicade [infliximab] Other (See Comments)   "joint pain"      Medication List    STOP taking these medications   amoxicillin-clavulanate 500-125 MG tablet Commonly known as:  AUGMENTIN     TAKE these medications   acetaminophen 500 MG tablet Commonly known as:  TYLENOL Take 1,000 mg by mouth every 6 (six) hours as needed for mild  pain.   albuterol 108 (90 Base) MCG/ACT inhaler Commonly known as:  PROVENTIL HFA;VENTOLIN HFA Inhale 2 puffs into the lungs every 6 (six) hours as needed for wheezing or shortness of breath.   calcitRIOL 0.5 MCG capsule Commonly known as:  ROCALTROL Take 0.5 mcg by mouth daily.   cyanocobalamin 1000 MCG/ML injection Commonly known as:  (VITAMIN B-12) Inject 1,000 mcg into the muscle every 30 (thirty) days. Last injection was 04/28/12   Fluticasone-Umeclidin-Vilant 100-62.5-25 MCG/INH Aepb Commonly known as:   TRELEGY ELLIPTA Inhale 1 puff into the lungs daily.   folic acid 1 MG tablet Commonly known as:  FOLVITE Take 1 mg by mouth daily.   furosemide 40 MG tablet Commonly known as:  LASIX Take 0.5 tablets (20 mg total) by mouth 3 (three) times daily as needed. What changed:  how much to take   HUMIRA 40 MG/0.8ML Pskt Generic drug:  Adalimumab Inject 40 mg into the skin every 14 (fourteen) days.   levothyroxine 125 MCG tablet Commonly known as:  SYNTHROID, LEVOTHROID Take 125 mcg by mouth every morning.   potassium chloride SA 20 MEQ tablet Commonly known as:  K-DUR,KLOR-CON Take 60 mEq by mouth 4 (four) times daily.   predniSONE 5 MG tablet Commonly known as:  DELTASONE Take 5 mg by mouth daily with breakfast.   promethazine 25 MG tablet Commonly known as:  PHENERGAN Take 25 mg by mouth every 6 (six) hours as needed for nausea or vomiting.   protein supplement shake Liqd Commonly known as:  PREMIER PROTEIN Take 325 mLs (11 oz total) by mouth 2 (two) times daily between meals.   sodium bicarbonate 650 MG tablet Take 1 tablet (650 mg total) by mouth 2 (two) times daily. What changed:  how much to take  when to take this   traMADol 50 MG tablet Commonly known as:  ULTRAM Take 1 tablet (50 mg total) by mouth every 6 (six) hours as needed for severe pain.   Vitamin D3 2000 units Tabs Take 2,000 Units by mouth daily.       Allergies  Allergen Reactions  . Mercaptopurine Other (See Comments)    Caused pancreatitis  . Remicade [Infliximab] Other (See Comments)    "joint pain"   Consultations:  Gastroenterology  Nephrology via Telephone   Procedures/Studies: Ct Abdomen Pelvis Wo Contrast  Result Date: 07/11/2016 CLINICAL DATA:  Initial evaluation for acute abdominal pain, vomiting, loose stools. History of Crohn's disease. EXAM: CT ABDOMEN AND PELVIS WITHOUT CONTRAST TECHNIQUE: Multidetector CT imaging of the abdomen and pelvis was performed following the  standard protocol without IV contrast. COMPARISON:  Prior CT from 04/17/2016. FINDINGS: Lower chest: Mild scattered atelectatic changes noted within the visualized lung bases. The visualized lungs are otherwise clear. Trace pericardial effusion noted. Hepatobiliary: Limited noncontrast evaluation of the liver is unremarkable. Gallbladder surgically absent. Prominence of the common bile duct most likely related to post cholecystectomy changes. Pancreas: Pancreas within normal limits without acute inflammatory changes. Mild prominence of the pancreatic duct is stable from previous. Spleen: Spleen within normal limits. Adrenals/Urinary Tract: Adrenal glands are unremarkable. Multiple scattered bilateral nonobstructive calculi present within the kidneys bilaterally. Largest of these on the left is positioned within the lower pole and measures 6 mm. Largest on the right also present within the lower pole and measures 7 mm. No hydronephrosis. Few small superimposed cysts noted within the kidneys bilaterally. No radiopaque calculi seen along either ureter. There is no hydroureter. Bladder within normal limits. Stomach/Bowel: Small diverticulum  noted at the gastric fundus without associated inflammation. Stomach otherwise unremarkable. Patient is status post ileocolonic resection with anastomotic suture within the mid abdomen. No evidence for obstruction. No significant wall thickening or inflammatory stranding about the anastomotic site. There is mild circumferential wall thickening about the distal colon, essentially extending from the anastomosis distally through the rectum, which may reflect mild colitis. No significant perienteric inflammatory stranding. Intraluminal fluid density throughout the colon, suggesting associated diarrheal illness. No other significant inflammatory changes about the bowels. Appendix not visualize, and is likely absent. Vascular/Lymphatic: Moderate aorto bi-iliac atherosclerotic disease. No  aneurysm. No adenopathy. Reproductive: Uterus is absent.  Ovaries not discretely identified. Other: No free air or fluid. Musculoskeletal: No acute osseous abnormality. No worrisome lytic or blastic osseous lesions. IMPRESSION: 1. Mild circumferential wall thickening about the colon, suggesting mild colitis. This may be either infectious or inflammatory in nature. Associated intraluminal fluid density throughout the colon suggests associated acute diarrheal illness. 2. No other acute intra-abdominal or pelvic process identified. 3. Bilateral nonobstructive nephrolithiasis as above. 4. Small gastric diverticulum without associated inflammation. 5. Moderate aorto bi-iliac atherosclerotic disease.  No aneurysm. 6. Status post cholecystectomy. Electronically Signed   By: Jeannine Boga M.D.   On: 07/11/2016 23:24   US Venous Img Lower Bilateral  Result Date: 07/09/2016 CLINICAL DATA:  71 year old female with small healing ulceration of lateral aspect of the right fifth toe EXAM: BILATERAL LOWER EXTREMITY VENOUS DUPLEX ULTRASOUND TECHNIQUE: Gray-scale sonography with graded compression, as well as color Doppler and duplex ultrasound, were performed to evaluate the deep and superficial veins of both lower extremities. Spectral Doppler was utilized to evaluate flow at rest and with distal augmentation maneuvers. A complete superficial venous insufficiency exam was performed in the upright standing position. I personally performed the technical portion of the exam. COMPARISON:  None. FINDINGS: RIGHT Deep Venous System: Evaluation of the deep venous system including the common femoral, femoral, profunda femoral, popliteal and calf veins (where visible) demonstrate no evidence of deep venous thrombosis. The vessels are compressible and demonstrate normal respiratory phasicity and response to augmentation. No evidence of deep venous reflux. RIGHT Superficial Venous System: SFJ: Within normal limits GSV Prox Thigh:  Within normal limits GSV Mid Thigh: Within normal limits GSV Lower Thigh: Within normal limits GSV Prox Calf: Within normal limits GSV Mid Calf: Within normal limits GSV Distal Calf: Within normal limits Other: None LEFT Deep Venous System: The common femoral, profunda femoral and femoral vein in the proximal thigh are all patent and compressible. However, beginning in the mid thigh femoral vein becomes noncompressible and filled with internal echogenic material. The sonographic appearance is most consistent with chronic thrombus. Chronic thrombus extends throughout the thigh and into the popliteal vein. Several prominent adjacent collaterals can be visualized. Additionally, there is partial recanalization in the popliteal vein with color flow on color Doppler imaging. LEFT Superficial Venous System: SFJ: Within normal limits GSV Prox Thigh: Within normal limits GSV Mid Thigh: Within normal limits GSV Lower Thigh: Within normal limits IMPRESSION: 1. Positive for chronic and partially recanalized DVT in the left lower extremity involving the femoral vein in the mid and distal thigh as well as the popliteal vein. 2. No evidence of DVT in the right lower extremity. 3. No evidence of superficial venous insufficiency in either lower extremity. Signed, Criselda Peaches, MD [facility] Electronically Signed   By: Jacqulynn Cadet M.D.   On: 07/09/2016 16:07   [place] [facility]  [facility]  Result Date: 07/09/2016 Please refer to "Notes" to see consult details.    Subjective: Seen and examined at bedside and was doing better. No nausea/vomiting and abdominal   Discharge Exam: Vitals:   07/14/16 2118 07/15/16 0506  BP: 116/78 122/67  Pulse: 90 86  Resp: 18 16  Temp: 99 F (37.2 C) 99.1 F (37.3 C)   Vitals:   07/14/16 0522 07/14/16 1446 07/14/16 2118 07/15/16 0506  BP: 96/69 104/63 116/78 122/67  Pulse: 92 92 90 86  Resp: 17 18 18 16   Temp: 98.6  F (37 C) 98.1 F (36.7 C) 99 F (37.2 C) 99.1 F (37.3 C)  TempSrc: Oral Oral Oral Oral  SpO2: 93% 96% 93% 93%  Weight:      Height:       General: Pt is alert, awake, not in acute distress; Patient is thin cachectic elderly female. Cardiovascular: RRR, S1/S2 +, no rubs, no gallops Respiratory: CTA bilaterally, no wheezing, no rhonchi Abdominal: Soft, NT, ND, bowel sounds + Extremities: no edema, no cyanosis; Right 5th toe ulcer noted  The results of significant diagnostics from this hospitalization (including imaging, microbiology, ancillary and laboratory) are listed below for reference.    Microbiology: Recent Results (from the past 240 hour(s))  Gastrointestinal Panel by PCR , Stool     Status: None   Collection Time: 07/14/16 12:34 PM  Result Value Ref Range Status   Campylobacter species NOT DETECTED NOT DETECTED Final   Plesimonas shigelloides NOT DETECTED NOT DETECTED Final   Salmonella species NOT DETECTED NOT DETECTED Final   Yersinia enterocolitica NOT DETECTED NOT DETECTED Final   Vibrio species NOT DETECTED NOT DETECTED Final   Vibrio cholerae NOT DETECTED NOT DETECTED Final   Enteroaggregative E coli (EAEC) NOT DETECTED NOT DETECTED Final   Enteropathogenic E coli (EPEC) NOT DETECTED NOT DETECTED Final   Enterotoxigenic E coli (ETEC) NOT DETECTED NOT DETECTED Final   Shiga like toxin producing E coli (STEC) NOT DETECTED NOT DETECTED Final   E. coli O157 NOT DETECTED NOT DETECTED Final   Shigella/Enteroinvasive E coli (EIEC) NOT DETECTED NOT DETECTED Final   Cryptosporidium NOT DETECTED NOT DETECTED Final   Cyclospora cayetanensis NOT DETECTED NOT DETECTED Final   Entamoeba histolytica NOT DETECTED NOT DETECTED Final   Giardia lamblia NOT DETECTED NOT DETECTED Final   Adenovirus F40/41 NOT DETECTED NOT DETECTED Final   Astrovirus NOT DETECTED NOT DETECTED Final   Norovirus GI/GII NOT DETECTED NOT DETECTED Final   Rotavirus A NOT DETECTED NOT DETECTED Final    Sapovirus (I, II, IV, and V) NOT DETECTED NOT DETECTED Final  C difficile quick scan w PCR reflex     Status: None   Collection Time: 07/14/16 12:34 PM  Result Value Ref Range Status   C Diff antigen NEGATIVE NEGATIVE Final   C Diff toxin NEGATIVE NEGATIVE Final   C Diff interpretation No C. difficile detected.  Final    Labs: BNP (last 3 results) No results for input(s): BNP in the last 8760 hours. Basic Metabolic Panel:  Recent Labs Lab 07/11/16 1638 07/12/16 0609 07/13/16 0544 07/14/16 0541 07/15/16 0549  NA 144 145 142 143 142  K 2.5* 3.4* 3.1* 3.5 3.5  CL 124* 126* 113* 109 109  CO2 13* 14* 22 28 26   GLUCOSE 81 67 70 80 75  BUN 47* 45* 40* 35* 27*  CREATININE 4.93* 4.31* 3.94* 3.53* 2.99*  CALCIUM 7.3* 6.6* 6.2* 6.9* 7.9*  MG  --   --  0.9* 2.0 1.7  PHOS  --   --  1.9* 1.6* 2.2*   Liver Function Tests:  Recent Labs Lab 07/11/16 1638 07/13/16 0544 07/14/16 0541 07/15/16 0549  AST 15 14* 17 19  ALT 11* 9* 10* 10*  ALKPHOS 120 77 79 75  BILITOT 1.0 1.0 0.3 0.6  PROT 6.9 5.2* 5.0* 4.8*  ALBUMIN 3.5 2.6* 2.5* 2.5*    Recent Labs Lab 07/11/16 1638  LIPASE 38   No results for input(s): AMMONIA in the last 168 hours. CBC:  Recent Labs Lab 07/11/16 1638 07/12/16 0609 07/13/16 0544 07/14/16 0541 07/15/16 0549  WBC 9.4 7.2 6.2 5.6 7.5  NEUTROABS  --   --  3.3 2.7 3.9  HGB 10.6* 9.4* 8.0* 7.8* 8.3*  HCT 32.6* 29.2* 23.7* 23.8* 25.4*  MCV 95.9 97.3 93.3 93.3 98.1  PLT 123* 98* 93* 99* 105*   Cardiac Enzymes: No results for input(s): CKTOTAL, CKMB, CKMBINDEX, TROPONINI in the last 168 hours. BNP: Invalid input(s): POCBNP CBG: No results for input(s): GLUCAP in the last 168 hours. D-Dimer No results for input(s): DDIMER in the last 72 hours. Hgb A1c No results for input(s): HGBA1C in the last 72 hours. Lipid Profile No results for input(s): CHOL, HDL, LDLCALC, TRIG, CHOLHDL, LDLDIRECT in the last 72 hours. Thyroid function studies  Recent  Labs  07/14/16 0541  TSH 3.590   Anemia work up No results for input(s): VITAMINB12, FOLATE, FERRITIN, TIBC, IRON, RETICCTPCT in the last 72 hours. Urinalysis    Component Value Date/Time   COLORURINE STRAW (A) 07/11/2016 1630   APPEARANCEUR CLEAR 07/11/2016 1630   LABSPEC 1.008 07/11/2016 1630   PHURINE 5.0 07/11/2016 1630   GLUCOSEU NEGATIVE 07/11/2016 1630   HGBUR MODERATE (A) 07/11/2016 1630   BILIRUBINUR NEGATIVE 07/11/2016 1630   KETONESUR NEGATIVE 07/11/2016 1630   PROTEINUR 30 (A) 07/11/2016 1630   UROBILINOGEN 0.2 09/09/2014 0850   NITRITE NEGATIVE 07/11/2016 1630   LEUKOCYTESUR NEGATIVE 07/11/2016 1630   Sepsis Labs Invalid input(s): PROCALCITONIN,  WBC,  LACTICIDVEN Microbiology Recent Results (from the past 240 hour(s))  Gastrointestinal Panel by PCR , Stool     Status: None   Collection Time: 07/14/16 12:34 PM  Result Value Ref Range Status   Campylobacter species NOT DETECTED NOT DETECTED Final   Plesimonas shigelloides NOT DETECTED NOT DETECTED Final   Salmonella species NOT DETECTED NOT DETECTED Final   Yersinia enterocolitica NOT DETECTED NOT DETECTED Final   Vibrio species NOT DETECTED NOT DETECTED Final   Vibrio cholerae NOT DETECTED NOT DETECTED Final   Enteroaggregative E coli (EAEC) NOT DETECTED NOT DETECTED Final   Enteropathogenic E coli (EPEC) NOT DETECTED NOT DETECTED Final   Enterotoxigenic E coli (ETEC) NOT DETECTED NOT DETECTED Final   Shiga like toxin producing E coli (STEC) NOT DETECTED NOT DETECTED Final   E. coli O157 NOT DETECTED NOT DETECTED Final   Shigella/Enteroinvasive E coli (EIEC) NOT DETECTED NOT DETECTED Final   Cryptosporidium NOT DETECTED NOT DETECTED Final   Cyclospora cayetanensis NOT DETECTED NOT DETECTED Final   Entamoeba histolytica NOT DETECTED NOT DETECTED Final   Giardia lamblia NOT DETECTED NOT DETECTED Final   Adenovirus F40/41 NOT DETECTED NOT DETECTED Final   Astrovirus NOT DETECTED NOT DETECTED Final   Norovirus  GI/GII NOT DETECTED NOT DETECTED Final   Rotavirus A NOT DETECTED NOT DETECTED Final   Sapovirus (I, II, IV, and V) NOT DETECTED NOT DETECTED Final  C difficile quick scan w PCR reflex  Status: None   Collection Time: 07/14/16 12:34 PM  Result Value Ref Range Status   C Diff antigen NEGATIVE NEGATIVE Final   C Diff toxin NEGATIVE NEGATIVE Final   C Diff interpretation No C. difficile detected.  Final   Time coordinating discharge: Over 30 minutes  SIGNED:  Kerney Elbe, DO [facility] 07/15/2016, 12:33 PM Pager [phone]  If 7PM-7AM, please contact night-coverage [email or web address] Password [email or web address]

## 2016-07-23 ENCOUNTER — Other Ambulatory Visit: Payer: Self-pay | Admitting: Internal Medicine

## 2016-07-23 DIAGNOSIS — I5042 Chronic combined systolic (congestive) and diastolic (congestive) heart failure: Secondary | ICD-10-CM

## 2016-07-30 ENCOUNTER — Encounter: Payer: Self-pay | Admitting: Physician Assistant

## 2016-07-30 ENCOUNTER — Ambulatory Visit (INDEPENDENT_AMBULATORY_CARE_PROVIDER_SITE_OTHER): Payer: Medicare Other | Admitting: Physician Assistant

## 2016-07-30 VITALS — BP 128/70 | HR 91 | Ht 64.0 in | Wt 99.8 lb

## 2016-07-30 DIAGNOSIS — I351 Nonrheumatic aortic (valve) insufficiency: Secondary | ICD-10-CM | POA: Diagnosis not present

## 2016-07-30 DIAGNOSIS — I700 Atherosclerosis of aorta: Secondary | ICD-10-CM | POA: Diagnosis not present

## 2016-07-30 DIAGNOSIS — N184 Chronic kidney disease, stage 4 (severe): Secondary | ICD-10-CM

## 2016-07-30 DIAGNOSIS — J449 Chronic obstructive pulmonary disease, unspecified: Secondary | ICD-10-CM

## 2016-07-30 DIAGNOSIS — K5080 Crohn's disease of both small and large intestine without complications: Secondary | ICD-10-CM

## 2016-07-30 DIAGNOSIS — D638 Anemia in other chronic diseases classified elsewhere: Secondary | ICD-10-CM

## 2016-07-30 DIAGNOSIS — I119 Hypertensive heart disease without heart failure: Secondary | ICD-10-CM

## 2016-07-30 DIAGNOSIS — R0602 Shortness of breath: Secondary | ICD-10-CM

## 2016-07-30 DIAGNOSIS — E876 Hypokalemia: Secondary | ICD-10-CM | POA: Diagnosis not present

## 2016-07-30 NOTE — Patient Instructions (Addendum)
Medication Instructions:  Your physician recommends that you continue on your current medications as directed. Please refer to the Current Medication list given to you today.  Labwork: 1. TODAY BMET, PRO BNP  Testing/Procedures: 1. Your physician has requested that you have an echocardiogram. Echocardiography is a painless test that uses sound waves to create images of your heart. It provides your doctor with information about the size and shape of your heart and how well your heart's chambers and valves are working. This procedure takes approximately one hour. There are no restrictions for this procedure.  Follow-Up: SCOTT WEAVER, PAC IN 2-3 WEEKS SAME DAY DR. Acie Fredrickson IS IN THE OFFICE  Any Other Special Instructions Will Be Listed Below (If Applicable).  If you need a refill on your cardiac medications before your next appointment, please call your pharmacy.

## 2016-07-30 NOTE — Progress Notes (Signed)
Cardiology Office Note:    Date:  07/30/2016   ID:  Natasha [name], DOB 12/13/1945, MRN [id number]  PCP:  Ileana Roup, MD  Cardiologist:  Dr. Liam Rogers   Electrophysiologist:  n/a Pulmonologist: Dr. Halford Chessman Nephrologist: Dr. Mercy Moore GI:  Dr. Earle Gell Rheumatologist: Dr. Trudie Reed  Referring MD: Ronnell Guadalajara*   Chief Complaint  Patient presents with  . Congestive Heart Failure    History of Present Illness:    Natasha [name] is a 71 y.o. female ex-smoker with a hx of COPD, HTN, CKD stage 4, hypothyroidism, Crohn's disease s/p ileocolonic resection, chronic anemia. She was evaluated by Dr. Acie Fredrickson in 4/15 for dyspnea. It was not felt that her shortness of breath was related to cardiac etiology.  She was admitted in 2/18 with worsening renal function in the setting of gastroenteritis.  She did have +FOBT x 2.  This was felt to be c/w hx of Crohn's disease.  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  She was not put on anticoagulation due to +FOBT and this is to be discussed with her PCP.  Reviewed records from PCP.  She has prior hx of L leg DVT with prior Coumadin Rx stopped in 2015.  Patient also saw a vascular surgeon in 2016.    She is referred back to Cardiology for evaluation of "Chronic Combined Systolic and Diastolic CHF."    She is here alone today. She notes chronic dyspnea for many years. Over the past 6 months, her breathing has gotten worse. Her pulmonologist has changed her inhaled medications without significant benefit. She is dyspneic with minimal activity. She had to stop several times coming into the office today and actually showed up 15 minutes late because of this. She often has to stop several times at the grocery store. She has limited her activity more more over the past several months. She notes symptoms of orthopnea as well as PND and LE edema. Her  weight has not really changed. She does have occasional heaviness in her chest with certain positional changes as well as with activity. She denies associated symptoms. She denies syncope.  Prior CV studies:   The following studies were reviewed today:  Echo 2/15 EF 55, mod AI, MAC, mild MR, E/E' 15 suggesting significant diastolic dysfunction  .  Past Medical History:  Diagnosis Date  . Anal fistula   . Anemia in chronic kidney disease (CKD)   . Arthritis   . Borderline hypertension   . Bulging disc    L3-L4  . Chronic diarrhea    due to crohn's  . CKD (chronic kidney disease), stage IV ([place])    NEPHROLOGIST--  DR MATTINGLY  . Crohn's disease ([place])    chronic ileitis  . Emphysema/COPD ([place])   . GERD (gastroesophageal reflux disease)   . History of glaucoma   . History of pancreatitis    2008--  mercaptopurine  . History of small bowel obstruction    12-03-2010  due to crohn's ileitis  . Hypothyroidism, postsurgical    multinodule w/ hurthle cells  . Nephrolithiasis    bilateral  . Osteoporosis   . Perianal Crohn's disease ([place])   . Polyarthralgia   . Wears partial dentures    upper    Past Surgical History:  Procedure Laterality Date  . ABDOMINAL HYSTERECTOMY  1990   and  Appendectomy  . COLON RESECTION  x3 --  1978,  1987, 1989   ILEAL RESECTION x2/   RIGHT COLECTOMY 1989  . COLONOSCOPY WITH PROPOFOL N/A 09/25/2014   Procedure: COLONOSCOPY WITH PROPOFOL;  Surgeon: Garlan Fair, MD;  Location: [facility];  Service: Endoscopy;  Laterality: N/A;  . ESOPHAGOGASTRODUODENOSCOPY  03/04/2012   Procedure: ESOPHAGOGASTRODUODENOSCOPY (EGD);  Surgeon: Garlan Fair, MD;  Location: [facility];  Service: Endoscopy;  Laterality: N/A;  . EXAMINATION UNDER ANESTHESIA N/A 09/12/2014   Procedure: EXAM UNDER ANESTHESIA;  Surgeon: Rolm Bookbinder, MD;  Location: [facility];  Service: General;  Laterality: N/A;  . FLEXIBLE SIGMOIDOSCOPY  03/04/2012   Procedure: FLEXIBLE  SIGMOIDOSCOPY;  Surgeon: Garlan Fair, MD;  Location: [facility];  Service: Endoscopy;  Laterality: N/A;  . GLAUCOMA SURGERY Bilateral   . INCISION AND DRAINAGE PERIRECTAL ABSCESS N/A 09/12/2014   Procedure: IRRIGATION AND DEBRIDEMENT PERIRECTAL ABSCESS;  Surgeon: Rolm Bookbinder, MD;  Location: [facility];  Service: General;  Laterality: N/A;  . NEGATIVE SLEEP STUDY  2008  . PLACEMENT OF SETON N/A 12/01/2014   Procedure: PLACEMENT OF SETON;  Surgeon: [name], MD;  Location: [facility];  Service: General;  Laterality: N/A;  . RECTAL EXAM UNDER ANESTHESIA N/A 12/01/2014   Procedure: RECTAL EXAM UNDER ANESTHESIA;  Surgeon: [name], MD;  Location: [facility];  Service: General;  Laterality: N/A;  . TOTAL THYROIDECTOMY  10-13-2003  . TRANSTHORACIC ECHOCARDIOGRAM  07-11-2013   mild LVH,  ef 55%,  moderate AR,  mild MR and TR,  trivial PR    Current Medications: Current Meds  Medication Sig  . acetaminophen (TYLENOL) 500 MG tablet Take 1,000 mg by mouth every 6 (six) hours as needed for mild pain.   . Adalimumab (HUMIRA) 40 MG/0.8ML PSKT Inject 40 mg into the skin every 14 (fourteen) days.  [name] albuterol (PROVENTIL HFA;VENTOLIN HFA) 108 (90 BASE) MCG/ACT inhaler Inhale 2 puffs into the lungs every 6 (six) hours as needed for wheezing or shortness of breath.  . calcitRIOL (ROCALTROL) 0.5 MCG capsule Take 0.5 mcg by mouth daily.  . Cholecalciferol (VITAMIN D3) 2000 UNITS TABS Take 2,000 Units by mouth daily.   . cyanocobalamin (,VITAMIN B-12,) 1000 MCG/ML injection Inject 1,000 mcg into the muscle every 30 (thirty) days. Last injection was 04/28/12  . Fluticasone-Umeclidin-Vilant (TRELEGY ELLIPTA) 100-62.5-25 MCG/INH AEPB Inhale 1 puff into the lungs daily.  . folic acid (FOLVITE) 1 MG tablet Take 1 mg by mouth daily.  . furosemide (LASIX) 40 MG tablet Take 0.5 tablets (20 mg total) by mouth 3 (three) times daily as needed.  [name] levothyroxine (SYNTHROID,  LEVOTHROID) 125 MCG tablet Take 125 mcg by mouth every morning.   . potassium chloride SA (K-DUR,KLOR-CON) 20 MEQ tablet Take 60 mEq by mouth 4 (four) times daily.   . predniSONE (DELTASONE) 5 MG tablet Take 5 mg by mouth daily with breakfast.  . promethazine (PHENERGAN) 25 MG tablet Take 25 mg by mouth every 6 (six) hours as needed for nausea or vomiting.   . sodium bicarbonate 650 MG tablet Take 1 tablet (650 mg total) by mouth 2 (two) times daily.  . traMADol (ULTRAM) 50 MG tablet Take 1 tablet (50 mg total) by mouth every 6 (six) hours as needed for severe pain.     Allergies:   Mercaptopurine and Remicade [infliximab]   Social History   Social History  . Marital status: Married    Spouse name: [name]  . Number of children: 3  . Years of education: 22  Occupational History  . Retired Retired   Social History Main Topics  . Smoking status: Former Smoker    Packs/day: 0.25    Years: 15.00    Types: Cigarettes    Quit date: 11/27/2001  . Smokeless tobacco: Never Used  . Alcohol use No  . Drug use: No  . Sexual activity: No   Other Topics Concern  . None   Social History Narrative   Patient is married [name]) and lives at home with her husband.   Patient has three children.   Patient is retired on disability.   Patient drinks two cups of caffeine daily.   Patient is right-handed.              Family History  Problem Relation Age of Onset  . Hypertension Father   . Heart disease Father   . Heart attack Father   . Diverticulitis Mother   . COPD Mother   . Hypertension Mother   . Heart disease Mother   . Heart attack Mother   . Colon cancer Brother   . Diabetes Brother   . Thyroid disease Brother      ROS:   Please see the history of present illness.    Review of Systems  Respiratory: Positive for shortness of breath.    All other systems reviewed and are negative.   EKGs/Labs/Other Test Reviewed:    EKG:  EKG is  ordered today.  The ekg ordered  today demonstrates NSR, HR 91, normal axis, poor R-wave progression, QTc 455 ms  Recent Labs: 07/14/2016: TSH 3.590 07/15/2016: ALT 10; Hemoglobin 8.3; Magnesium 1.7; Platelets 105 07/30/2016: BUN 31; Creatinine, Ser 2.84; NT-Pro BNP WILL FOLLOW; Potassium 3.7; Sodium 142   Recent Lipid Panel No results found for: CHOL, TRIG, HDL, CHOLHDL, VLDL, LDLCALC, LDLDIRECT  Venous duplex 07/09/16 IMPRESSION: 1. Positive for chronic and partially recanalized DVT in the left lower extremity involving the femoral vein in the mid and distal thigh as well as the popliteal vein. 2. No evidence of DVT in the right lower extremity. 3. No evidence of superficial venous insufficiency in either lower Extremity.  Chest CT 03/2016 IMPRESSION: 1. Slight wall thickening about the distal ileum and transverse colon at the level of the patient's ileocolic anastomosis, which may reflect slight exacerbation of the patient's Crohn's disease. Remaining bowel otherwise unremarkable in appearance. 2. Bilateral emphysema noted.  Minimal bilateral scarring seen. 3. Small diverticulum noted at the fundus of the stomach. 4. Scattered nonobstructing bilateral renal stones, measuring up to 7 mm in size. Small bilateral renal cysts seen. 5. Scattered aortic atherosclerosis.  Physical Exam:    VS:  BP 128/70 (BP Location: Left Arm)   Pulse 91   Ht 5' 4"  (1.626 m)   Wt 99 lb 12.8 oz (45.3 kg)   BMI 17.13 kg/m     Wt Readings from Last 3 Encounters:  07/30/16 99 lb 12.8 oz (45.3 kg)  07/12/16 97 lb 10.6 oz (44.3 kg)  07/09/16 98 lb 8 oz (44.7 kg)     Physical Exam  Constitutional: She is oriented to person, place, and time. She appears cachectic.  HENT:  Head: Normocephalic and atraumatic.  Eyes: No scleral icterus.  Neck: No JVD present.  Cardiovascular: Normal rate and regular rhythm.  Exam reveals distant heart sounds.   No murmur heard. Pulmonary/Chest: She has decreased breath sounds. She has no wheezes.  She has no rales.  Abdominal: There is no tenderness.  Musculoskeletal: She exhibits edema.  Trace-1+ bilat ankle edema  Neurological: She is alert and oriented to person, place, and time.  Skin: Skin is warm and dry.  Psychiatric: She has a normal mood and affect.    ASSESSMENT:    1. Shortness of breath   2. Aortic valve insufficiency, etiology of cardiac valve disease unspecified   3. Hypertensive heart disease without heart failure   4. CKD (chronic kidney disease) stage 4, GFR 15-29 ml/min (HCC)   5. Atherosclerosis of aorta ([place])   6. Chronic obstructive pulmonary disease, unspecified COPD type ([place])   7. Crohn's disease of both small and large intestine without complication ([place])   8. Anemia of chronic disease   9. Hypokalemia    PLAN:    In order of problems listed above:  1. Shortness of breath - She has symptoms consistent with congestive heart failure. She does not have a history of systolic dysfunction. She did have evidence of diastolic dysfunction on echocardiogram years ago. She was referred for a diagnosis of chronic combined systolic and diastolic heart failure. She has not had any other echocardiograms or cardiac testing at other institutions. On exam, she does not have significant signs of volume excess other than her chronic LE edema. She tells me that her leg edema does improve when lying supine. Her leg edema is likely more related to venous insufficiency than heart failure. She does have severe COPD. She also notes chest discomfort with activity. Her symptoms may be explained by COPD versus congestive heart failure versus cardiac ischemia vs anemia. With her chronic kidney disease, she is high-risk for contrast induced nephropathy. Cardiac catheterization should likely be avoided. Therefore, I am not certain that stress testing would be of much benefit at this point in time. She does need a repeat echocardiogram to assess her LV function, diastolic function and aortic  valve. A BNP would also be useful, especially if it is completely normal.  -  Arrange echocardiogram  -  BMET, BNP  -  Adjust Lasix if BNP significantly elevated  2. Aortic valve insufficiency, etiology of cardiac valve disease unspecified - She had moderate aortic insufficiency in 2015. Arrange repeat echocardiogram.  3. Hypertensive heart disease without heart failure - Blood pressure is controlled.  4. CKD (chronic kidney disease) stage 4, GFR 15-29 ml/min (HCC) - Continue follow-up with nephrology. If I increase her Lasix, she will need earlier follow-up with nephrology and close follow-up on her renal function and potassium.  5. Atherosclerosis of aorta ([place]) - Noted on prior CT scan. We will need to consider addition of aspirin and statin therapy at follow-up.  6. Chronic obstructive pulmonary disease, unspecified COPD type ([place]) - Continue follow-up with pulmonology.  7. Crohn's disease of both small and large intestine without complication ([place]) - She is chronically immunosuppressed.  8. Anemia of chronic disease - Her anemia worsened when she was recently hospitalized. Question if this is also contributing to her symptoms.  9. Hypokalemia - She is on very high doses of potassium secondary to chronic hypokalemia. I suspect that this is related to GI loss. Obtain BMET today. Consider adding spironolactone in the future.   Dispo:  Return in about 2 weeks (around 08/13/2016) for Close Follow Up, w/ Dr. Acie Fredrickson, w/ Richardson Dopp, PA-C.    Medication Adjustments/Labs and Tests Ordered: Current medicines are reviewed at length with the patient today.  Concerns regarding medicines are outlined above.  Medication changes, Labs and Tests ordered today are outlined in the Patient Instructions noted below.  Patient Instructions  Medication Instructions:  Your physician recommends that you continue on your current medications as directed. Please refer to the Current Medication list given to you  today.  Labwork: 1. TODAY BMET, PRO BNP  Testing/Procedures: 1. Your physician has requested that you have an echocardiogram. Echocardiography is a painless test that uses sound waves to create images of your heart. It provides your doctor with information about the size and shape of your heart and how well your heart's chambers and valves are working. This procedure takes approximately one hour. There are no restrictions for this procedure.  Follow-Up: SCOTT WEAVER, PAC IN 2-3 WEEKS SAME DAY DR. Acie Fredrickson IS IN THE OFFICE  Any Other Special Instructions Will Be Listed Below (If Applicable).  If you need a refill on your cardiac medications before your next appointment, please call your pharmacy.  Signed, Richardson Dopp, PA-C  07/30/2016 [facility] [place], [address], [place]  [address] Phone: [phone]; Fax: [phone]

## 2016-07-31 LAB — BASIC METABOLIC PANEL
BUN/Creatinine Ratio: 11 — ABNORMAL LOW (ref 12–28)
BUN: 31 mg/dL — ABNORMAL HIGH (ref 8–27)
CALCIUM: 8.7 mg/dL (ref 8.7–10.3)
CO2: 10 mmol/L — AB (ref 18–29)
CREATININE: 2.84 mg/dL — AB (ref 0.57–1.00)
Chloride: 112 mmol/L — ABNORMAL HIGH (ref 96–106)
GFR calc Af Amer: 19 mL/min/{1.73_m2} — ABNORMAL LOW (ref >59)
GFR, EST NON AFRICAN AMERICAN: 16 mL/min/{1.73_m2} — AB (ref >59)
Glucose: 78 mg/dL (ref 65–99)
POTASSIUM: 3.7 mmol/L (ref 3.5–5.2)
Sodium: 142 mmol/L (ref 134–144)

## 2016-07-31 LAB — PRO B NATRIURETIC PEPTIDE: NT-Pro BNP: 272 pg/mL (ref 0–301)

## 2016-08-05 ENCOUNTER — Other Ambulatory Visit (HOSPITAL_COMMUNITY): Payer: Medicare Other

## 2016-08-07 ENCOUNTER — Other Ambulatory Visit: Payer: Self-pay

## 2016-08-07 ENCOUNTER — Encounter: Payer: Self-pay | Admitting: Physician Assistant

## 2016-08-07 ENCOUNTER — Ambulatory Visit (HOSPITAL_COMMUNITY): Payer: Medicare Other | Attending: Cardiovascular Disease

## 2016-08-07 DIAGNOSIS — J439 Emphysema, unspecified: Secondary | ICD-10-CM | POA: Insufficient documentation

## 2016-08-07 DIAGNOSIS — I071 Rheumatic tricuspid insufficiency: Secondary | ICD-10-CM | POA: Diagnosis not present

## 2016-08-07 DIAGNOSIS — N189 Chronic kidney disease, unspecified: Secondary | ICD-10-CM | POA: Insufficient documentation

## 2016-08-07 DIAGNOSIS — I340 Nonrheumatic mitral (valve) insufficiency: Secondary | ICD-10-CM | POA: Diagnosis not present

## 2016-08-07 DIAGNOSIS — I351 Nonrheumatic aortic (valve) insufficiency: Secondary | ICD-10-CM | POA: Diagnosis present

## 2016-08-07 DIAGNOSIS — R0602 Shortness of breath: Secondary | ICD-10-CM | POA: Insufficient documentation

## 2016-08-07 DIAGNOSIS — E039 Hypothyroidism, unspecified: Secondary | ICD-10-CM | POA: Insufficient documentation

## 2016-08-07 DIAGNOSIS — I700 Atherosclerosis of aorta: Secondary | ICD-10-CM | POA: Diagnosis not present

## 2016-08-07 DIAGNOSIS — I129 Hypertensive chronic kidney disease with stage 1 through stage 4 chronic kidney disease, or unspecified chronic kidney disease: Secondary | ICD-10-CM | POA: Diagnosis not present

## 2016-08-08 ENCOUNTER — Telehealth: Payer: Self-pay | Admitting: *Deleted

## 2016-08-08 ENCOUNTER — Encounter: Payer: Self-pay | Admitting: Physician Assistant

## 2016-08-08 NOTE — Telephone Encounter (Signed)
Pt notified of echo results and findings by phone with verbal understanding. I will fax results to PCP as well. Pt thanked me for my call today.

## 2016-08-08 NOTE — Telephone Encounter (Signed)
F/U Call:  Patient is returning your call in regards to echo results. Thanks.

## 2016-08-08 NOTE — Telephone Encounter (Signed)
Lmtcb to go over echo results.  

## 2016-08-19 ENCOUNTER — Encounter: Payer: Self-pay | Admitting: Physician Assistant

## 2016-08-19 ENCOUNTER — Ambulatory Visit (INDEPENDENT_AMBULATORY_CARE_PROVIDER_SITE_OTHER): Payer: Medicare Other | Admitting: Physician Assistant

## 2016-08-19 ENCOUNTER — Encounter (INDEPENDENT_AMBULATORY_CARE_PROVIDER_SITE_OTHER): Payer: Self-pay

## 2016-08-19 VITALS — BP 130/64 | HR 90 | Ht 64.0 in | Wt 100.4 lb

## 2016-08-19 DIAGNOSIS — I700 Atherosclerosis of aorta: Secondary | ICD-10-CM

## 2016-08-19 DIAGNOSIS — N184 Chronic kidney disease, stage 4 (severe): Secondary | ICD-10-CM

## 2016-08-19 DIAGNOSIS — R0789 Other chest pain: Secondary | ICD-10-CM

## 2016-08-19 DIAGNOSIS — I351 Nonrheumatic aortic (valve) insufficiency: Secondary | ICD-10-CM | POA: Diagnosis not present

## 2016-08-19 DIAGNOSIS — R0602 Shortness of breath: Secondary | ICD-10-CM

## 2016-08-19 DIAGNOSIS — I119 Hypertensive heart disease without heart failure: Secondary | ICD-10-CM

## 2016-08-19 DIAGNOSIS — J449 Chronic obstructive pulmonary disease, unspecified: Secondary | ICD-10-CM | POA: Diagnosis not present

## 2016-08-19 MED ORDER — NITROGLYCERIN 0.4 MG SL SUBL: 0.4000 mg | SUBLINGUAL_TABLET | SUBLINGUAL | 3 refills | Status: DC | PRN

## 2016-08-19 NOTE — Patient Instructions (Signed)
Medication Instructions:  Nitroglycerin 0.4 mg - a prescription was sent to your pharmacy. If you have one of those chest pains we discussed, sit down and (if the pain is still there), take 1 nitroglycerin tablet (put it under your tongue).  You can repeat it after 5 minutes if you feel you need to.  But, I would prefer you only take 1 or 2 but no more.   If you feel the nitroglycerin helps your chest pain or your shortness of breath, call us for sooner follow up.  Labwork: None   Testing/Procedures: None   Follow-Up: Dr. Liam Rogers in 6 months.  Return sooner if your shortness of breath or chest pain changes or worsens.   Any Other Special Instructions Will Be Listed Below (If Applicable).  If you need a refill on your cardiac medications before your next appointment, please call your pharmacy.

## 2016-08-19 NOTE — Progress Notes (Signed)
Cardiology Office Note:    Date:  08/19/2016   ID:  Natasha [name], DOB 29-Jul-1945, MRN [id number]  PCP:  [name], MD  Cardiologist:  Dr. Liam Rogers   Electrophysiologist:  n/a Pulmonologist: Dr. Halford Chessman Nephrologist: Dr. Mercy Moore GI:  Dr. Earle Gell Rheumatologist: Dr. Trudie Reed  Referring MD: Ronnell Guadalajara*   Chief Complaint  Patient presents with  . Follow-up    shortness of breath    History of Present Illness:    Natasha [name] is a 71 y.o. female with a hx of COPD, HTN, CKD stage 4, hypothyroidism, Crohn's disease s/p ileocolonic resection, chronic anemia, prior DVT (Coumadin DC'd in 2015; saw Vascular in 2016). She was evaluated by Dr. Acie Fredrickson in 4/15 for dyspnea. It was not felt that her shortness of breath was related to a cardiac etiology.  I saw her 07/30/16 for the evaluation of CHF.  She noted chronic dyspnea but also described symptoms that sounded consistent with congestive heart failure. She also described chest discomfort with activity. Her exam was not entirely consistent with heart failure and I suspected that COPD was likely the major cause of her dyspnea. With her chronic kidney disease, I felt that she was high risk for contrast induced nephropathy. Therefore, cardiac catheterization should likely be avoided, if possible. BNP was obtained and was normal, essentially ruling out congestive heart failure.  An echo was obtained and demonstrated normal ejection fraction, mild diastolic dysfunction and moderate aortic insufficiency.  She returns for Cardiology follow up.  She is here alone.  Her shortness of breath is unchanged.  She still notes some chest pain at times with her shortness of breath.  She denies syncope, orthopnea, PND.  Leg edema is unchanged.    Prior CV studies:   The following studies were reviewed today:  Echo 08/07/16 Moderate concentric LVH, EF 60-65, normal wall motion, grade 1 diastolic dysfunction, moderate AI, mild  MR, normal RVSF, trivial TR  Chest CT 03/2016 Scattered aortic atherosclerosis  Echo 2/15 EF 55, mod AI, MAC, mild MR, E/E' 15 suggesting significant diastolic dysfunction   Past Medical History:  Diagnosis Date  . Anal fistula   . Anemia in chronic kidney disease (CKD)   . Aortic insufficiency    Echo 3/18: mod conc LVH, EF 60-65, no RWMA, Gr 1 DD, mod AI, mild MR, normal RVSF, Trivial TR  . Arthritis   . Borderline hypertension   . Bulging disc    L3-L4  . Chronic diarrhea    due to crohn's  . CKD (chronic kidney disease), stage IV ([place])    NEPHROLOGIST--  DR MATTINGLY  . Crohn's disease ([place])    chronic ileitis  . Emphysema/COPD ([place])   . GERD (gastroesophageal reflux disease)   . History of glaucoma   . History of pancreatitis    2008--  mercaptopurine  . History of small bowel obstruction    12-03-2010  due to crohn's ileitis  . Hypothyroidism, postsurgical    multinodule w/ hurthle cells  . Nephrolithiasis    bilateral  . Osteoporosis   . Perianal Crohn's disease ([place])   . Polyarthralgia   . Wears partial dentures    upper    Past Surgical History:  Procedure Laterality Date  . ABDOMINAL HYSTERECTOMY  1990   and  Appendectomy  . COLON RESECTION  x3 --  1978,  1987, 1989   ILEAL RESECTION x2/   [place]  . COLONOSCOPY WITH PROPOFOL N/A 09/25/2014   Procedure:  COLONOSCOPY WITH PROPOFOL;  Surgeon: Garlan Fair, MD;  Location: [facility];  Service: Endoscopy;  Laterality: N/A;  . ESOPHAGOGASTRODUODENOSCOPY  03/04/2012   Procedure: ESOPHAGOGASTRODUODENOSCOPY (EGD);  Surgeon: Garlan Fair, MD;  Location: [facility];  Service: Endoscopy;  Laterality: N/A;  . EXAMINATION UNDER ANESTHESIA N/A 09/12/2014   Procedure: EXAM UNDER ANESTHESIA;  Surgeon: Rolm Bookbinder, MD;  Location: [facility];  Service: General;  Laterality: N/A;  . FLEXIBLE SIGMOIDOSCOPY  03/04/2012   Procedure: FLEXIBLE SIGMOIDOSCOPY;  Surgeon: Garlan Fair, MD;  Location: [facility];  Service: Endoscopy;  Laterality: N/A;  . GLAUCOMA SURGERY Bilateral   . INCISION AND DRAINAGE PERIRECTAL ABSCESS N/A 09/12/2014   Procedure: IRRIGATION AND DEBRIDEMENT PERIRECTAL ABSCESS;  Surgeon: Rolm Bookbinder, MD;  Location: [facility];  Service: General;  Laterality: N/A;  . NEGATIVE SLEEP STUDY  2008  . PLACEMENT OF SETON N/A 12/01/2014   Procedure: PLACEMENT OF SETON;  Surgeon: [name], MD;  [facility];  Service: General;  Laterality: N/A;  . RECTAL EXAM UNDER ANESTHESIA N/A 12/01/2014   Procedure: RECTAL EXAM UNDER ANESTHESIA;  Surgeon: [name], MD;  Location: [facility];  Service: General;  Laterality: N/A;  . TOTAL THYROIDECTOMY  10-13-2003  . TRANSTHORACIC ECHOCARDIOGRAM  07-11-2013   mild LVH,  ef 55%,  moderate AR,  mild MR and TR,  trivial PR    Current Medications: Current Meds  Medication Sig  . acetaminophen (TYLENOL) 500 MG tablet Take 1,000 mg by mouth every 6 (six) hours as needed for mild pain.   . Adalimumab (HUMIRA) 40 MG/0.8ML PSKT Inject 40 mg into the skin every 14 (fourteen) days.  [name] albuterol (PROVENTIL HFA;VENTOLIN HFA) 108 (90 BASE) MCG/ACT inhaler Inhale 2 puffs into the lungs every 6 (six) hours as needed for wheezing or shortness of breath.  . calcitRIOL (ROCALTROL) 0.5 MCG capsule Take 0.5 mcg by mouth daily.  . Cholecalciferol (VITAMIN D3) 2000 UNITS TABS Take 2,000 Units by mouth daily.   . cyanocobalamin (,VITAMIN B-12,) 1000 MCG/ML injection Inject 1,000 mcg into the muscle every 30 (thirty) days. Last injection was 04/28/12  . Fluticasone-Umeclidin-Vilant (TRELEGY ELLIPTA) 100-62.5-25 MCG/INH AEPB Inhale 1 puff into the lungs daily.  . folic acid (FOLVITE) 1 MG tablet Take 1 mg by mouth daily.  . furosemide (LASIX) 40 MG tablet Take 0.5 tablets (20 mg total) by mouth 3 (three) times daily as needed.  [name] levothyroxine (SYNTHROID, LEVOTHROID) 125 MCG tablet Take 125 mcg by mouth every morning.     . potassium chloride SA (K-DUR,KLOR-CON) 20 MEQ tablet Take 60 mEq by mouth 4 (four) times daily.   . predniSONE (DELTASONE) 5 MG tablet Take 5 mg by mouth daily with breakfast.  . promethazine (PHENERGAN) 25 MG tablet Take 25 mg by mouth every 6 (six) hours as needed for nausea or vomiting.   . sodium bicarbonate 650 MG tablet Take 1 tablet (650 mg total) by mouth 2 (two) times daily.  . traMADol (ULTRAM) 50 MG tablet Take 1 tablet (50 mg total) by mouth every 6 (six) hours as needed for severe pain.     Allergies:   Mercaptopurine and Remicade [infliximab]   Social History   Social History  . Marital status: Married    Spouse name: [name]  . Number of children: 3  . Years of education: 12   Occupational History  . Retired Retired   Social History Main Topics  . Smoking status: Former Smoker    Packs/day:  0.25    Years: 15.00    Types: Cigarettes    Quit date: 11/27/2001  . Smokeless tobacco: Never Used  . Alcohol use No  . Drug use: No  . Sexual activity: No   Other Topics Concern  . None   Social History Narrative   Patient is married [name]) and lives at home with her husband.   Patient has three children.   Patient is retired on disability.   Patient drinks two cups of caffeine daily.   Patient is right-handed.              Family History  Problem Relation Age of Onset  . Hypertension Father   . Heart disease Father   . Heart attack Father   . Diverticulitis Mother   . COPD Mother   . Hypertension Mother   . Heart disease Mother   . Heart attack Mother   . Colon cancer Brother   . Diabetes Brother   . Thyroid disease Brother      ROS:   Please see the history of present illness.    Review of Systems  Cardiovascular: Positive for dyspnea on exertion.  Musculoskeletal: Positive for joint swelling.   All other systems reviewed and are negative.   EKGs/Labs/Other Test Reviewed:    EKG:  EKG is not ordered today.  The ekg ordered today  demonstrates n/a  Recent Labs: 07/14/2016: TSH 3.590 07/15/2016: ALT 10; Hemoglobin 8.3; Magnesium 1.7; Platelets 105 07/30/2016: BUN 31; Creatinine, Ser 2.84; NT-Pro BNP 272; Potassium 3.7; Sodium 142   Recent Lipid Panel No results found for: CHOL, TRIG, HDL, CHOLHDL, VLDL, LDLCALC, LDLDIRECT   Physical Exam:    VS:  BP 130/64   Pulse 90   Ht _0  (1.626 m)   Wt 100 lb 6.4 oz (45.5 kg)   BMI 17.23 kg/m     Wt Readings from Last 3 Encounters:  08/19/16 100 lb 6.4 oz (45.5 kg)  07/30/16 99 lb 12.8 oz (45.3 kg)  07/12/16 97 lb 10.6 oz (44.3 kg)     Physical Exam  Constitutional: She is oriented to person, place, and time. She appears cachectic. No distress.  HENT:  Head: Normocephalic and atraumatic.  Eyes: No scleral icterus.  Neck: Normal range of motion. No JVD present.  Cardiovascular: Normal rate, regular rhythm, S1 normal, S2 normal and normal heart sounds.   No murmur heard. Pulmonary/Chest: She has decreased breath sounds. She has no wheezes. She has no rhonchi. She has no rales.  Abdominal: Soft. There is no tenderness.  Musculoskeletal: She exhibits edema (trace ankle edema bilaterally ).  Neurological: She is alert and oriented to person, place, and time.  Skin: Skin is warm and dry.  Psychiatric: She has a normal mood and affect.    ASSESSMENT:    1. Other chest pain   2. Shortness of breath   3. Aortic valve insufficiency, etiology of cardiac valve disease unspecified   4. Hypertensive heart disease without heart failure   5. CKD (chronic kidney disease) stage 4, GFR 15-29 ml/min (HCC)   6. Atherosclerosis of aorta ([place])   7. Chronic obstructive pulmonary disease, unspecified COPD type ([place])    PLAN:    In order of problems listed above:  1. Other chest pain -  She has s/w atypical chest pain.  She does have significant risk factors for coronary artery disease. She also has a history of aortic atherosclerosis on prior CT scan. She is high risk for  contrast induced nephropathy and therefore, cardiac catheterization should be avoided if at all possible. At this point, I do not think stress testing would be useful. After much discussion, we agreed for her to take a prescription for sublingual nitroglycerin as needed. If she discovers that using this for her chest symptoms provide significant relief, we will need to consider further testing, i.e. nuclear stress test.  -  SL NTG prescription given  -  She knows to return sooner if she has significant improvement with NTG  2. Shortness of breath -  I suspect that this is multifactorial and related to significant COPD, chronic anemia, chronic deconditioning. She does have diastolic dysfunction on her echocardiogram. However, recent BNP was normal. Therefore, she does not display any signs of congestive heart failure at this time.  3. Aortic valve insufficiency, etiology of cardiac valve disease unspecified -  She has moderate aortic insufficiency on echocardiogram. This is stable compared to her prior echocardiogram. This does not seem to be related to her current symptoms. She will need repeat echocardiogram at some point in the next 1-2 years.  4. Hypertensive heart disease without heart failure - Blood pressure is well controlled.  5. CKD (chronic kidney disease) stage 4, GFR 15-29 ml/min (HCC) -  Continue follow-up with nephrology. As noted, she is high risk for contrast induced nephropathy and therefore, cardiac catheterization should be avoided if possible.  6. Atherosclerosis of aorta (HCC) -  Given her chronic anemia from chronic GI blood loss, I think the risks of aspirin would outweigh the benefits at this time.  7. Chronic obstructive pulmonary disease, unspecified COPD type ([place]) - I suspect her dyspnea is mainly related to COPD. Continue follow-up with pulmonology.   Dispo:  Return in about 6 months (around 02/19/2017) for Routine Follow Up, w/ Dr. Acie Fredrickson.    Medication  Adjustments/Labs and Tests Ordered: Current medicines are reviewed at length with the patient today.  Concerns regarding medicines are outlined above.  Medication changes, Labs and Tests ordered today are outlined in the Patient Instructions noted below. Patient Instructions  Medication Instructions:  Nitroglycerin 0.4 mg - a prescription was sent to your pharmacy. If you have one of those chest pains we discussed, sit down and (if the pain is still there), take 1 nitroglycerin tablet (put it under your tongue).  You can repeat it after 5 minutes if you feel you need to.  But, I would prefer you only take 1 or 2 but no more.   If you feel the nitroglycerin helps your chest pain or your shortness of breath, call us for sooner follow up.  Labwork: None   Testing/Procedures: None   Follow-Up: Dr. Liam Rogers in 6 months.  Return sooner if your shortness of breath or chest pain changes or worsens.   Any Other Special Instructions Will Be Listed Below (If Applicable).  If you need a refill on your cardiac medications before your next appointment, please call your pharmacy.   Signed, Richardson Dopp, PA-C  08/19/2016 5:04 PM    [facility] [place], [place], [place]  [address] Phone: [phone]; Fax: [phone]

## 2016-09-01 ENCOUNTER — Other Ambulatory Visit: Payer: Self-pay | Admitting: Internal Medicine

## 2016-09-01 DIAGNOSIS — Z1231 Encounter for screening mammogram for malignant neoplasm of breast: Secondary | ICD-10-CM

## 2016-09-02 ENCOUNTER — Ambulatory Visit
Admission: RE | Admit: 2016-09-02 | Discharge: 2016-09-02 | Disposition: A | Discharge status: Home / Self Care | Payer: Medicare Other | Source: Ambulatory Visit | Attending: Internal Medicine | Admitting: Internal Medicine

## 2016-09-02 DIAGNOSIS — Z1231 Encounter for screening mammogram for malignant neoplasm of breast: Secondary | ICD-10-CM

## 2016-11-06 ENCOUNTER — Other Ambulatory Visit: Payer: Self-pay

## 2016-11-06 DIAGNOSIS — N185 Chronic kidney disease, stage 5: Secondary | ICD-10-CM

## 2016-11-07 ENCOUNTER — Other Ambulatory Visit (HOSPITAL_COMMUNITY): Payer: Self-pay | Admitting: *Deleted

## 2016-11-10 ENCOUNTER — Ambulatory Visit (HOSPITAL_COMMUNITY)
Admission: RE | Admit: 2016-11-10 | Discharge: 2016-11-10 | Disposition: A | Discharge status: Home / Self Care | Payer: Medicare Other | Source: Ambulatory Visit | Attending: Nephrology | Admitting: Nephrology

## 2016-11-10 DIAGNOSIS — N189 Chronic kidney disease, unspecified: Secondary | ICD-10-CM | POA: Diagnosis not present

## 2016-11-10 DIAGNOSIS — D631 Anemia in chronic kidney disease: Secondary | ICD-10-CM | POA: Diagnosis present

## 2016-11-10 MED ORDER — SODIUM CHLORIDE 0.9 % IV SOLN
510.0000 mg | Freq: Once | INTRAVENOUS | Status: AC
  Administered 2016-11-10: 11:00:00 510 mg via INTRAVENOUS
  Filled 2016-11-10: qty 17

## 2016-11-18 ENCOUNTER — Emergency Department (HOSPITAL_COMMUNITY): Payer: Medicare Other

## 2016-11-18 ENCOUNTER — Encounter (HOSPITAL_COMMUNITY): Payer: Self-pay | Admitting: Vascular Surgery

## 2016-11-18 ENCOUNTER — Observation Stay (HOSPITAL_COMMUNITY)
Admission: EM | Admit: 2016-11-18 | Discharge: 2016-11-21 | Disposition: A | Discharge status: Home / Self Care | Payer: Medicare Other | Attending: Family Medicine | Admitting: Family Medicine

## 2016-11-18 DIAGNOSIS — N132 Hydronephrosis with renal and ureteral calculous obstruction: Secondary | ICD-10-CM | POA: Diagnosis not present

## 2016-11-18 DIAGNOSIS — M810 Age-related osteoporosis without current pathological fracture: Secondary | ICD-10-CM | POA: Insufficient documentation

## 2016-11-18 DIAGNOSIS — N289 Disorder of kidney and ureter, unspecified: Secondary | ICD-10-CM

## 2016-11-18 DIAGNOSIS — N179 Acute kidney failure, unspecified: Secondary | ICD-10-CM | POA: Diagnosis not present

## 2016-11-18 DIAGNOSIS — E875 Hyperkalemia: Secondary | ICD-10-CM | POA: Diagnosis not present

## 2016-11-18 DIAGNOSIS — J439 Emphysema, unspecified: Secondary | ICD-10-CM | POA: Diagnosis not present

## 2016-11-18 DIAGNOSIS — E038 Other specified hypothyroidism: Secondary | ICD-10-CM | POA: Diagnosis not present

## 2016-11-18 DIAGNOSIS — Z86718 Personal history of other venous thrombosis and embolism: Secondary | ICD-10-CM | POA: Diagnosis not present

## 2016-11-18 DIAGNOSIS — E039 Hypothyroidism, unspecified: Secondary | ICD-10-CM | POA: Insufficient documentation

## 2016-11-18 DIAGNOSIS — Z888 Allergy status to other drugs, medicaments and biological substances status: Secondary | ICD-10-CM | POA: Insufficient documentation

## 2016-11-18 DIAGNOSIS — E872 Acidosis, unspecified: Secondary | ICD-10-CM | POA: Diagnosis present

## 2016-11-18 DIAGNOSIS — E440 Moderate protein-calorie malnutrition: Secondary | ICD-10-CM

## 2016-11-18 DIAGNOSIS — N23 Unspecified renal colic: Secondary | ICD-10-CM

## 2016-11-18 DIAGNOSIS — N184 Chronic kidney disease, stage 4 (severe): Secondary | ICD-10-CM

## 2016-11-18 DIAGNOSIS — N2581 Secondary hyperparathyroidism of renal origin: Secondary | ICD-10-CM | POA: Diagnosis not present

## 2016-11-18 DIAGNOSIS — I5032 Chronic diastolic (congestive) heart failure: Secondary | ICD-10-CM

## 2016-11-18 DIAGNOSIS — Z79899 Other long term (current) drug therapy: Secondary | ICD-10-CM | POA: Diagnosis not present

## 2016-11-18 DIAGNOSIS — D696 Thrombocytopenia, unspecified: Secondary | ICD-10-CM | POA: Diagnosis not present

## 2016-11-18 DIAGNOSIS — N281 Cyst of kidney, acquired: Secondary | ICD-10-CM | POA: Diagnosis not present

## 2016-11-18 DIAGNOSIS — M1990 Unspecified osteoarthritis, unspecified site: Secondary | ICD-10-CM | POA: Insufficient documentation

## 2016-11-18 DIAGNOSIS — N200 Calculus of kidney: Secondary | ICD-10-CM

## 2016-11-18 DIAGNOSIS — N186 End stage renal disease: Secondary | ICD-10-CM | POA: Diagnosis present

## 2016-11-18 DIAGNOSIS — N135 Crossing vessel and stricture of ureter without hydronephrosis: Secondary | ICD-10-CM

## 2016-11-18 DIAGNOSIS — K5090 Crohn's disease, unspecified, without complications: Secondary | ICD-10-CM

## 2016-11-18 DIAGNOSIS — Z87442 Personal history of urinary calculi: Secondary | ICD-10-CM | POA: Insufficient documentation

## 2016-11-18 DIAGNOSIS — I130 Hypertensive heart and chronic kidney disease with heart failure and stage 1 through stage 4 chronic kidney disease, or unspecified chronic kidney disease: Secondary | ICD-10-CM | POA: Insufficient documentation

## 2016-11-18 DIAGNOSIS — K219 Gastro-esophageal reflux disease without esophagitis: Secondary | ICD-10-CM | POA: Diagnosis not present

## 2016-11-18 DIAGNOSIS — Z992 Dependence on renal dialysis: Secondary | ICD-10-CM

## 2016-11-18 DIAGNOSIS — Z87891 Personal history of nicotine dependence: Secondary | ICD-10-CM | POA: Insufficient documentation

## 2016-11-18 DIAGNOSIS — Z9049 Acquired absence of other specified parts of digestive tract: Secondary | ICD-10-CM | POA: Insufficient documentation

## 2016-11-18 DIAGNOSIS — I700 Atherosclerosis of aorta: Secondary | ICD-10-CM | POA: Diagnosis not present

## 2016-11-18 DIAGNOSIS — Z8249 Family history of ischemic heart disease and other diseases of the circulatory system: Secondary | ICD-10-CM | POA: Insufficient documentation

## 2016-11-18 LAB — COMPREHENSIVE METABOLIC PANEL
ALT: 11 U/L — ABNORMAL LOW (ref 14–54)
ANION GAP: 8 (ref 5–15)
AST: 16 U/L (ref 15–41)
Albumin: 3.6 g/dL (ref 3.5–5.0)
Alkaline Phosphatase: 153 U/L — ABNORMAL HIGH (ref 38–126)
BUN: 35 mg/dL — ABNORMAL HIGH (ref 6–20)
CHLORIDE: 120 mmol/L — AB (ref 101–111)
CO2: 11 mmol/L — ABNORMAL LOW (ref 22–32)
CREATININE: 3.58 mg/dL — AB (ref 0.44–1.00)
Calcium: 8.8 mg/dL — ABNORMAL LOW (ref 8.9–10.3)
GFR, EST AFRICAN AMERICAN: 14 mL/min — AB (ref >60)
GFR, EST NON AFRICAN AMERICAN: 12 mL/min — AB (ref >60)
Glucose, Bld: 82 mg/dL (ref 65–99)
Potassium: 3.8 mmol/L (ref 3.5–5.1)
Sodium: 139 mmol/L (ref 135–145)
Total Bilirubin: 0.6 mg/dL (ref 0.3–1.2)
Total Protein: 6.5 g/dL (ref 6.5–8.1)

## 2016-11-18 LAB — URINALYSIS, ROUTINE W REFLEX MICROSCOPIC
BILIRUBIN URINE: NEGATIVE
Glucose, UA: NEGATIVE mg/dL
Ketones, ur: NEGATIVE mg/dL
LEUKOCYTES UA: NEGATIVE
Nitrite: NEGATIVE
PH: 5 (ref 5.0–8.0)
Protein, ur: 30 mg/dL — AB
Specific Gravity, Urine: 1.013 (ref 1.005–1.030)

## 2016-11-18 LAB — CBC
HCT: 37.4 % (ref 36.0–46.0)
HEMOGLOBIN: 11.6 g/dL — AB (ref 12.0–15.0)
MCH: 30.4 pg (ref 26.0–34.0)
MCHC: 31 g/dL (ref 30.0–36.0)
MCV: 97.9 fL (ref 78.0–100.0)
PLATELETS: 148 10*3/uL — AB (ref 150–400)
RBC: 3.82 MIL/uL — AB (ref 3.87–5.11)
RDW: 16 % — ABNORMAL HIGH (ref 11.5–15.5)
WBC: 10.7 10*3/uL — AB (ref 4.0–10.5)

## 2016-11-18 LAB — LIPASE, BLOOD: LIPASE: 72 U/L — AB (ref 11–51)

## 2016-11-18 MED ORDER — FLUTICASONE FUROATE-VILANTEROL 100-25 MCG/INH IN AEPB
1.0000 | INHALATION_SPRAY | Freq: Every day | RESPIRATORY_TRACT | Status: DC
  Administered 2016-11-19 – 2016-11-21 (×2): 1 via RESPIRATORY_TRACT
  Filled 2016-11-18 (×2): qty 28

## 2016-11-18 MED ORDER — CALCITRIOL 0.5 MCG PO CAPS
0.5000 ug | ORAL_CAPSULE | Freq: Every day | ORAL | Status: DC
  Administered 2016-11-18 – 2016-11-21 (×4): 0.5 ug via ORAL
  Filled 2016-11-18 (×4): qty 1

## 2016-11-18 MED ORDER — FLUTICASONE-UMECLIDIN-VILANT 100-62.5-25 MCG/INH IN AEPB: 1.0000 | INHALATION_SPRAY | Freq: Every day | RESPIRATORY_TRACT | Status: DC

## 2016-11-18 MED ORDER — PREDNISONE 5 MG PO TABS
5.0000 mg | ORAL_TABLET | Freq: Every day | ORAL | Status: DC
  Administered 2016-11-19 – 2016-11-21 (×3): 5 mg via ORAL
  Filled 2016-11-18 (×3): qty 1

## 2016-11-18 MED ORDER — FUROSEMIDE 20 MG PO TABS
20.0000 mg | ORAL_TABLET | Freq: Every evening | ORAL | Status: DC
  Filled 2016-11-18: qty 1

## 2016-11-18 MED ORDER — POTASSIUM CHLORIDE CRYS ER 20 MEQ PO TBCR
60.0000 meq | EXTENDED_RELEASE_TABLET | Freq: Three times a day (TID) | ORAL | Status: DC
  Administered 2016-11-18 – 2016-11-19 (×3): 60 meq via ORAL
  Filled 2016-11-18 (×3): qty 3

## 2016-11-18 MED ORDER — ACETAMINOPHEN 10 MG/ML IV SOLN
1000.0000 mg | Freq: Four times a day (QID) | INTRAVENOUS | Status: AC
  Administered 2016-11-18 – 2016-11-19 (×4): 1000 mg via INTRAVENOUS
  Filled 2016-11-18 (×6): qty 100

## 2016-11-18 MED ORDER — ACETAMINOPHEN 325 MG PO TABS: 650.0000 mg | ORAL_TABLET | Freq: Four times a day (QID) | ORAL | Status: DC | PRN

## 2016-11-18 MED ORDER — ONDANSETRON HCL 4 MG/2ML IJ SOLN
4.0000 mg | Freq: Once | INTRAMUSCULAR | Status: AC
  Administered 2016-11-18: 4 mg via INTRAVENOUS
  Filled 2016-11-18: qty 2

## 2016-11-18 MED ORDER — FOLIC ACID 1 MG PO TABS
1.0000 mg | ORAL_TABLET | Freq: Every day | ORAL | Status: DC
  Administered 2016-11-18 – 2016-11-21 (×4): 1 mg via ORAL
  Filled 2016-11-18 (×4): qty 1

## 2016-11-18 MED ORDER — LEVOTHYROXINE SODIUM 25 MCG PO TABS
125.0000 ug | ORAL_TABLET | Freq: Every day | ORAL | Status: DC
  Administered 2016-11-19 – 2016-11-21 (×3): 125 ug via ORAL
  Filled 2016-11-18 (×3): qty 1

## 2016-11-18 MED ORDER — ADALIMUMAB 40 MG/0.8ML ~~LOC~~ PSKT: 40.0000 mg | PREFILLED_SYRINGE | SUBCUTANEOUS | Status: DC

## 2016-11-18 MED ORDER — TAMSULOSIN HCL 0.4 MG PO CAPS
0.4000 mg | ORAL_CAPSULE | Freq: Every day | ORAL | Status: DC
  Administered 2016-11-18 – 2016-11-21 (×4): 0.4 mg via ORAL
  Filled 2016-11-18 (×4): qty 1

## 2016-11-18 MED ORDER — TRAMADOL HCL 50 MG PO TABS: 50.0000 mg | ORAL_TABLET | Freq: Four times a day (QID) | ORAL | Status: DC | PRN

## 2016-11-18 MED ORDER — FUROSEMIDE 40 MG PO TABS
40.0000 mg | ORAL_TABLET | Freq: Every day | ORAL | Status: DC
  Filled 2016-11-18: qty 1

## 2016-11-18 MED ORDER — SODIUM CHLORIDE 0.9 % IV BOLUS (SEPSIS)
1000.0000 mL | Freq: Once | INTRAVENOUS | Status: AC
  Administered 2016-11-18: 1000 mL via INTRAVENOUS

## 2016-11-18 MED ORDER — ONDANSETRON 4 MG PO TBDP
4.0000 mg | ORAL_TABLET | Freq: Once | ORAL | Status: AC | PRN
  Administered 2016-11-18: 4 mg via ORAL

## 2016-11-18 MED ORDER — UMECLIDINIUM BROMIDE 62.5 MCG/INH IN AEPB
1.0000 | INHALATION_SPRAY | Freq: Every day | RESPIRATORY_TRACT | Status: DC
  Administered 2016-11-19 – 2016-11-21 (×2): 1 via RESPIRATORY_TRACT
  Filled 2016-11-18 (×2): qty 7

## 2016-11-18 MED ORDER — PROMETHAZINE HCL 25 MG PO TABS: 25.0000 mg | ORAL_TABLET | Freq: Four times a day (QID) | ORAL | Status: DC | PRN

## 2016-11-18 MED ORDER — ACETAMINOPHEN 650 MG RE SUPP
650.0000 mg | Freq: Four times a day (QID) | RECTAL | Status: DC | PRN
Start: 2016-11-18 — End: 2016-11-18

## 2016-11-18 MED ORDER — SODIUM CHLORIDE 0.9 % IV SOLN
INTRAVENOUS | Status: DC
  Administered 2016-11-18 – 2016-11-19 (×3): via INTRAVENOUS

## 2016-11-18 MED ORDER — MORPHINE SULFATE (PF) 4 MG/ML IV SOLN
4.0000 mg | Freq: Once | INTRAVENOUS | Status: AC
  Administered 2016-11-18: 4 mg via INTRAVENOUS
  Filled 2016-11-18: qty 1

## 2016-11-18 MED ORDER — SODIUM BICARBONATE 650 MG PO TABS
650.0000 mg | ORAL_TABLET | Freq: Two times a day (BID) | ORAL | Status: DC
  Administered 2016-11-18 – 2016-11-20 (×4): 650 mg via ORAL
  Filled 2016-11-18 (×5): qty 1

## 2016-11-18 MED ORDER — MORPHINE SULFATE (PF) 4 MG/ML IV SOLN
2.0000 mg | INTRAVENOUS | Status: DC | PRN
  Administered 2016-11-18: 4 mg via INTRAVENOUS
  Administered 2016-11-18 – 2016-11-19 (×2): 2 mg via INTRAVENOUS
  Filled 2016-11-18 (×3): qty 1

## 2016-11-18 MED ORDER — ONDANSETRON 4 MG PO TBDP
ORAL_TABLET | ORAL | Status: AC
  Filled 2016-11-18: qty 1

## 2016-11-18 NOTE — Consult Note (Signed)
Urology Consult   Physician requesting consult: Shirlyn Goltz  Reason for consult: Nephrolithiasis  History of Present Illness: Natasha [name] is a 71 y.o. female with PMH significant for CKD, anemia, HTN, Crohn's disease, and COPD who presented to the ED today for eval of right flank pain.  Pt states she first noted painless gross hematuria 48 hours ago.  This resolved and then she developed severe right flank pain.  She had two episodes of vomiting yesterday but none today.  Her pain continued to progress in severity prompted her to present for eval.  CT scan shows a 5 mm distal right ureteral stone with hydroureteronephrosis as well as multiple bilateral non obstructing renal stones and several renal cysts.  WBC 10.7, Cr 3.58 (varies from 2.8-4.9).  UA reveals few bacteria, 6-30 WBCs, TNTC RBCs, and nitrite negative.  Urine culture was ordered.    She is currently comfortable but still having some right sided pain.  She denies F/C, HA, CP, SOB, N/V, diarrhea/constipation, dysuria, and difficulty voiding.  She had never had a kidney stone before.    Past Medical History:  Diagnosis Date  . Anal fistula   . Anemia in chronic kidney disease (CKD)   . Aortic insufficiency    Echo 3/18: mod conc LVH, EF 60-65, no RWMA, Gr 1 DD, mod AI, mild MR, normal RVSF, Trivial TR  . Arthritis   . Borderline hypertension   . Bulging disc    L3-L4  . Chronic diarrhea    due to crohn's  . CKD (chronic kidney disease), stage IV ([place])    NEPHROLOGIST--  DR MATTINGLY  . Crohn's disease ([place])    chronic ileitis  . Emphysema/COPD ([place])   . GERD (gastroesophageal reflux disease)   . History of glaucoma   . History of pancreatitis    2008--  mercaptopurine  . History of small bowel obstruction    12-03-2010  due to crohn's ileitis  . Hypothyroidism, postsurgical    multinodule w/ hurthle cells  . Nephrolithiasis    bilateral  . Osteoporosis   . Perianal Crohn's disease ([place])   . Polyarthralgia   .  Wears partial dentures    upper    Past Surgical History:  Procedure Laterality Date  . ABDOMINAL HYSTERECTOMY  1990   and  Appendectomy  . COLON RESECTION  x3 --  1978,  1987, 1989   ILEAL RESECTION x2/   [place]  . COLONOSCOPY WITH PROPOFOL N/A 09/25/2014   Procedure: COLONOSCOPY WITH PROPOFOL;  Surgeon: Garlan Fair, MD;  Location: [facility];  Service: Endoscopy;  Laterality: N/A;  . ESOPHAGOGASTRODUODENOSCOPY  03/04/2012   Procedure: ESOPHAGOGASTRODUODENOSCOPY (EGD);  Surgeon: Garlan Fair, MD;  Location: [facility];  Service: Endoscopy;  Laterality: N/A;  . EXAMINATION UNDER ANESTHESIA N/A 09/12/2014   Procedure: EXAM UNDER ANESTHESIA;  Surgeon: Rolm Bookbinder, MD;  Location: [place] [facility];  Service: General;  Laterality: N/A;  . FLEXIBLE SIGMOIDOSCOPY  03/04/2012   Procedure: FLEXIBLE SIGMOIDOSCOPY;  Surgeon: Garlan Fair, MD;  Location: [facility];  Service: Endoscopy;  Laterality: N/A;  . GLAUCOMA SURGERY Bilateral   . INCISION AND DRAINAGE PERIRECTAL ABSCESS N/A 09/12/2014   Procedure: IRRIGATION AND DEBRIDEMENT PERIRECTAL ABSCESS;  Surgeon: Rolm Bookbinder, MD;  Location: [place];  Service: General;  Laterality: N/A;  . NEGATIVE SLEEP STUDY  2008  . PLACEMENT OF SETON N/A 12/01/2014   Procedure: PLACEMENT OF SETON;  Surgeon: [name], MD;  [facility];  Service:  General;  Laterality: N/A;  . RECTAL EXAM UNDER ANESTHESIA N/A 12/01/2014   Procedure: RECTAL EXAM UNDER ANESTHESIA;  Surgeon: [name], MD;  Location: [place];  Service: General;  Laterality: N/A;  . TOTAL THYROIDECTOMY  10-13-2003  . TRANSTHORACIC ECHOCARDIOGRAM  07-11-2013   mild LVH,  ef 55%,  moderate AR,  mild MR and TR,  trivial PR     Current Hospital Medications:  Home meds:  Current Meds  Medication Sig  . acetaminophen (TYLENOL) 500 MG tablet Take 1,000 mg by mouth every 6 (six) hours as needed for mild pain.   . Adalimumab  (HUMIRA) 40 MG/0.8ML PSKT Inject 40 mg into the skin every 14 (fourteen) days.  [name] albuterol (PROVENTIL HFA;VENTOLIN HFA) 108 (90 BASE) MCG/ACT inhaler Inhale 2 puffs into the lungs every 6 (six) hours as needed for wheezing or shortness of breath.  [name] alendronate (FOSAMAX) 70 MG tablet Take 70 mg by mouth once a week. Mondays  . calcitRIOL (ROCALTROL) 0.5 MCG capsule Take 0.5 mcg by mouth daily.  . Cholecalciferol (VITAMIN D3) 2000 UNITS TABS Take 2,000 Units by mouth daily.   . cyanocobalamin (,VITAMIN B-12,) 1000 MCG/ML injection Inject 1,000 mcg into the muscle See admin instructions. Every 60 days  . Fluticasone-Umeclidin-Vilant (TRELEGY ELLIPTA) 100-62.5-25 MCG/INH AEPB Inhale 1 puff into the lungs daily.  . folic acid (FOLVITE) 1 MG tablet Take 1 mg by mouth daily.  . furosemide (LASIX) 40 MG tablet Take 0.5 tablets (20 mg total) by mouth 3 (three) times daily as needed. (Patient taking differently: Take 20-40 mg by mouth 2 (two) times daily. 3m in am, 245min pm)  . levothyroxine (SYNTHROID, LEVOTHROID) 125 MCG tablet Take 125 mcg by mouth every morning.   . nitroGLYCERIN (NITROSTAT) 0.4 MG SL tablet Place 1 tablet (0.4 mg total) under the tongue every 5 (five) minutes as needed for chest pain.  . potassium chloride SA (K-DUR,KLOR-CON) 20 MEQ tablet Take 60 mEq by mouth 4 (four) times daily.   . predniSONE (DELTASONE) 5 MG tablet Take 5 mg by mouth daily with breakfast.  . promethazine (PHENERGAN) 25 MG tablet Take 25 mg by mouth every 6 (six) hours as needed for nausea or vomiting.   . sodium bicarbonate 650 MG tablet Take 1 tablet (650 mg total) by mouth 2 (two) times daily.  . traMADol (ULTRAM) 50 MG tablet Take 1 tablet (50 mg total) by mouth every 6 (six) hours as needed for severe pain.    Scheduled Meds: . calcitRIOL  0.5 mcg Oral Daily  . fluticasone furoate-vilanterol  1 puff Inhalation Daily   And  . umeclidinium bromide  1 puff Inhalation Daily  . folic acid  1 mg Oral Daily   . furosemide  20 mg Oral QPM  . [START ON 11/19/2016] furosemide  40 mg Oral Daily  . [START ON 11/19/2016] levothyroxine  125 mcg Oral QAC breakfast  . ondansetron      . potassium chloride SA  60 mEq Oral QID  . [START ON 11/19/2016] predniSONE  5 mg Oral Q breakfast  . sodium bicarbonate  650 mg Oral BID  . tamsulosin  0.4 mg Oral Daily   Continuous Infusions: . sodium chloride 100 mL/hr at 11/18/16 1552   PRN Meds:.acetaminophen **OR** acetaminophen, morphine injection, promethazine, traMADol  Allergies:  Allergies  Allergen Reactions  . Mercaptopurine Other (See Comments)    Caused pancreatitis  . Remicade [Infliximab] Other (See Comments)    "joint pain"  Family History  Problem Relation Age of Onset  . Hypertension Father   . Heart disease Father   . Heart attack Father   . Diverticulitis Mother   . COPD Mother   . Hypertension Mother   . Heart disease Mother   . Heart attack Mother   . Colon cancer Brother   . Diabetes Brother   . Thyroid disease Brother     Social History:  reports that she quit smoking about 14 years ago. Her smoking use included Cigarettes. She has a 3.75 pack-year smoking history. She has never used smokeless tobacco. She reports that she does not drink alcohol or use drugs.  ROS: A complete review of systems was performed.  All systems are negative except for pertinent findings as noted.  Physical Exam:  Vital signs in last 24 hours: Temp:  [97.6 F (36.4 C)] 97.6 F (36.4 C) (06/26 0713) Pulse Rate:  [77-96] 82 (06/26 1500) Resp:  [15-20] 16 (06/26 1500) BP: (107-137)/(56-80) 117/67 (06/26 1500) SpO2:  [98 %-100 %] 98 % (06/26 1500) Weight:  [44.5 kg (98 lb)] 44.5 kg (98 lb) (06/26 0714) Constitutional:  Alert and oriented, No acute distress Cardiovascular: Regular rate and rhythm Respiratory: Normal respiratory effort GI: Abdomen is soft, tender right side, nondistended, no abdominal masses GU: right sided CVA  tenderness Lymphatic: No lymphadenopathy Neurologic: Grossly intact, no focal deficits Psychiatric: Normal mood and affect  Laboratory Data:   Recent Labs  11/18/16 0747  WBC 10.7*  HGB 11.6*  HCT 37.4  PLT 148*     Recent Labs  11/18/16 0747  NA 139  K 3.8  CL 120*  GLUCOSE 82  BUN 35*  CALCIUM 8.8*  CREATININE 3.58*     Results for orders placed or performed during the hospital encounter of 11/18/16 (from the past 24 hour(s))  Urinalysis, Routine w reflex microscopic     Status: Abnormal   Collection Time: 11/18/16  7:44 AM  Result Value Ref Range   Color, Urine YELLOW YELLOW   APPearance CLEAR CLEAR   Specific Gravity, Urine 1.013 1.005 - 1.030   pH 5.0 5.0 - 8.0   Glucose, UA NEGATIVE NEGATIVE mg/dL   Hgb urine dipstick LARGE (A) NEGATIVE   Bilirubin Urine NEGATIVE NEGATIVE   Ketones, ur NEGATIVE NEGATIVE mg/dL   Protein, ur 30 (A) NEGATIVE mg/dL   Nitrite NEGATIVE NEGATIVE   Leukocytes, UA NEGATIVE NEGATIVE   RBC / HPF TOO NUMEROUS TO COUNT 0 - 5 RBC/hpf   WBC, UA 6-30 0 - 5 WBC/hpf   Bacteria, UA FEW (A) NONE SEEN   Squamous Epithelial / LPF 0-5 (A) NONE SEEN   Mucous PRESENT   Lipase, blood     Status: Abnormal   Collection Time: 11/18/16  7:47 AM  Result Value Ref Range   Lipase 72 (H) 11 - 51 U/L  Comprehensive metabolic panel     Status: Abnormal   Collection Time: 11/18/16  7:47 AM  Result Value Ref Range   Sodium 139 135 - 145 mmol/L   Potassium 3.8 3.5 - 5.1 mmol/L   Chloride 120 (H) 101 - 111 mmol/L   CO2 11 (L) 22 - 32 mmol/L   Glucose, Bld 82 65 - 99 mg/dL   BUN 35 (H) 6 - 20 mg/dL   Creatinine, Ser 3.58 (H) 0.44 - 1.00 mg/dL   Calcium 8.8 (L) 8.9 - 10.3 mg/dL   Total Protein 6.5 6.5 - 8.1 g/dL   Albumin 3.6 3.5 - 5.0  g/dL   AST 16 15 - 41 U/L   ALT 11 (L) 14 - 54 U/L   Alkaline Phosphatase 153 (H) 38 - 126 U/L   Total Bilirubin 0.6 0.3 - 1.2 mg/dL   GFR calc non Af Amer 12 (L) >60 mL/min   GFR calc Af Amer 14 (L) >60 mL/min    Anion gap 8 5 - 15  CBC     Status: Abnormal   Collection Time: 11/18/16  7:47 AM  Result Value Ref Range   WBC 10.7 (H) 4.0 - 10.5 K/uL   RBC 3.82 (L) 3.87 - 5.11 MIL/uL   Hemoglobin 11.6 (L) 12.0 - 15.0 g/dL   HCT 37.4 36.0 - 46.0 %   MCV 97.9 78.0 - 100.0 fL   MCH 30.4 26.0 - 34.0 pg   MCHC 31.0 30.0 - 36.0 g/dL   RDW 16.0 (H) 11.5 - 15.5 %   Platelets 148 (L) 150 - 400 K/uL   No results found for this or any previous visit (from the past 240 hour(s)).  Renal Function:  Recent Labs  11/18/16 0747  CREATININE 3.58*   Estimated Creatinine Clearance: 10.3 mL/min (A) (by C-G formula based on SCr of 3.58 mg/dL (H)).  Radiologic Imaging: Ct Renal Stone Study  Result Date: 11/18/2016 CLINICAL DATA:  Onset of hematuria this morning, history Crohn's disease with prior bowel resection, cholecystectomy, pancreatitis, stage IV chronic kidney disease, nephrolithiasis EXAM: CT ABDOMEN AND PELVIS WITHOUT CONTRAST TECHNIQUE: Multidetector CT imaging of the abdomen and pelvis was performed following the standard protocol without IV contrast. Sagittal and coronal MPR images reconstructed from axial data set. Oral contrast not administered for this indication. COMPARISON:  07/11/2016 FINDINGS: Lower chest: Lung bases hyperinflated but clear Hepatobiliary: Gallbladder surgically absent. Intrahepatic and extrahepatic biliary dilatation, CBD 11 mm previously 10 mm. No focal hepatic lesions. Pancreas: Normal appearance Spleen: Normal appearance Adrenals/Urinary Tract: Numerous BILATERAL nonobstructing renal calculi. In addition, RIGHT hydronephrosis secondary to a 5 mm distal RIGHT ureteral calculus image 59, new. Probable small BILATERAL renal cysts unchanged. Bladder unremarkable. Stomach/Bowel: Appendix surgically absent. Anastomotic staple line within transverse colon. Stomach decompressed. No definite focal bowel abnormalities within limitations of exam lacking contrast. Vascular/Lymphatic: Few  scattered pelvic phleboliths. Atherosclerotic calcifications aorta without aneurysm. Reproductive: Uterus surgically absent.  Nonvisualization of ovaries Other: No free air or free fluid.  No hernia. Musculoskeletal: Bones demineralized. IMPRESSION: New RIGHT hydronephrosis and hydroureter secondary to a 5 mm distal RIGHT ureteral calculus. Additional BILATERAL nonobstructing renal calculi and small renal cysts. Chronic biliary dilatation, potentially related to prior cholecystectomy, can correlate with LFTs. Aortic Atherosclerosis (ICD10-I70.0). Electronically Signed   By: Lavonia Dana M.D.   On: 11/18/2016 11:59    Impression/Recommendation:  Right ureteral stone with hydroureteronephrosis--conservative care with hydration, pain control, and Flomax.  She should be able to pass this stone without intervention, however, if her renal function worsens, she develops infection, or uncontrolled pain/vomiting she would require cysto with stent.  Will make NPO after MN just in case.  Her urine is consistent with a stone and does not currently appear infected.  F/u urine culture and treat if positive. Strain urine and retrieve stone for eval. After she has passed the stone and had time to recover her urine should be rechecked ensure hematuria has resolved.  She has multiple other non obstructing renal stones that also do no require intervention at this time.   [place], [place] 11/18/2016, 3:56 PM

## 2016-11-18 NOTE — ED Notes (Signed)
Patient is back  from x ray hooked patient back up to the monitor did vitals patient is resting with family at bedside

## 2016-11-18 NOTE — ED Triage Notes (Signed)
Pt reports to the ED for eval of RLQ and right flank pain. Sudden onset this am that woke her from sleep around 2 am. Pain is constant in nature. Describes it as a throbbing/aching pain. Took 2 tylenol without relief. She also reports hematuria that she describes as a brownish color to her urine with blood on the toilet tissue with wiping. She also reports N/V. Denies diarrhea. Has hx of Chron's. Denies any hx of nephrolithiasis.

## 2016-11-18 NOTE — ED Notes (Signed)
EDP at bedside  

## 2016-11-18 NOTE — H&P (Signed)
History and Physical    Natasha [name]:[id number] DOB: 01-02-1946 DOA: 11/18/2016  PCP: [name], MD Patient coming from: Home  Chief Complaint: flank and abdominal pain  HPI: Natasha [name] is a 71 y.o. female with medical history significant of Crohn's disease, nephrolithiasis, hypothyroidism, pancreatitis, GERD, COPD, C KD, hypertension.   Acute onset right flank pain. Associated with gross hematuria. Patient states that she was in her normal state of health prior to onset of symptoms which started on 11/16/2016 with hematuria. The following day patient continued to have hematuria but then developed right sided flank pain. When the pain becomes severe patient becomes nauseous but denies any emesis. Of note patient has a history of Crohn's disease and states that this pain is different than her typical Crohn's flare. Denies fevers, dysuria, frequency, neck, headache, just become shortness breath, palpitations. Pain is somewhat of a dull achy pain but becomes sharp in nature from time to time.  Initially intermittent but now is constant. States that she has previous been told that she has kidney stones but has never had pain or hematuria while passing them. Pain seems to be traveling from her flank region down towards her suprapubic region.   ED Course: Objective findings outlined below. Urology consult to by EDP.  Review of Systems: As per HPI otherwise all other systems reviewed and are negative  Ambulatory Status:no restrictions  Past Medical History:  Diagnosis Date  . Anal fistula   . Anemia in chronic kidney disease (CKD)   . Aortic insufficiency    Echo 3/18: mod conc LVH, EF 60-65, no RWMA, Gr 1 DD, mod AI, mild MR, normal RVSF, Trivial TR  . Arthritis   . Borderline hypertension   . Bulging disc    L3-L4  . Chronic diarrhea    due to crohn's  . CKD (chronic kidney disease), stage IV ([place])    NEPHROLOGIST--  DR MATTINGLY  . Crohn's disease ([place])    chronic ileitis  . Emphysema/COPD ([place])   . GERD (gastroesophageal reflux disease)   . History of glaucoma   . History of pancreatitis    2008--  mercaptopurine  . History of small bowel obstruction    12-03-2010  due to crohn's ileitis  . Hypothyroidism, postsurgical    multinodule w/ hurthle cells  . Nephrolithiasis    bilateral  . Osteoporosis   . Perianal Crohn's disease ([place])   . Polyarthralgia   . Wears partial dentures    upper    Past Surgical History:  Procedure Laterality Date  . ABDOMINAL HYSTERECTOMY  1990   and  Appendectomy  . COLON RESECTION  x3 --  1978,  1987, 1989   ILEAL RESECTION x2/   [place]  . COLONOSCOPY WITH PROPOFOL N/A 09/25/2014   Procedure: COLONOSCOPY WITH PROPOFOL;  Surgeon: Garlan Fair, MD;  Location: [facility];  Service: Endoscopy;  Laterality: N/A;  . ESOPHAGOGASTRODUODENOSCOPY  03/04/2012   Procedure: ESOPHAGOGASTRODUODENOSCOPY (EGD);  Surgeon: Garlan Fair, MD;  Location: [facility];  Service: Endoscopy;  Laterality: N/A;  . EXAMINATION UNDER ANESTHESIA N/A 09/12/2014   Procedure: EXAM UNDER ANESTHESIA;  Surgeon: Rolm Bookbinder, MD;  Location: [place];  Service: General;  Laterality: N/A;  . FLEXIBLE SIGMOIDOSCOPY  03/04/2012   Procedure: FLEXIBLE SIGMOIDOSCOPY;  Surgeon: Garlan Fair, MD;  Location: [facility];  Service: Endoscopy;  Laterality: N/A;  . GLAUCOMA SURGERY Bilateral   . INCISION AND DRAINAGE PERIRECTAL ABSCESS N/A 09/12/2014   Procedure: IRRIGATION AND  DEBRIDEMENT PERIRECTAL ABSCESS;  Surgeon: Rolm Bookbinder, MD;  Location: [place];  Service: General;  Laterality: N/A;  . NEGATIVE SLEEP STUDY  2008  . PLACEMENT OF SETON N/A 12/01/2014   Procedure: PLACEMENT OF SETON;  Surgeon: [name], MD;  Location: [facility];  Service: General;  Laterality: N/A;  . RECTAL EXAM UNDER ANESTHESIA N/A 12/01/2014   Procedure: RECTAL EXAM UNDER ANESTHESIA;  Surgeon: [name], MD;  Location:  [place];  Service: General;  Laterality: N/A;  . TOTAL THYROIDECTOMY  10-13-2003  . TRANSTHORACIC ECHOCARDIOGRAM  07-11-2013   mild LVH,  ef 55%,  moderate AR,  mild MR and TR,  trivial PR    Social History   Social History  . Marital status: Married    Spouse name: [name]  . Number of children: 3  . Years of education: 12   Occupational History  . Retired Retired   Social History Main Topics  . Smoking status: Former Smoker    Packs/day: 0.25    Years: 15.00    Types: Cigarettes    Quit date: 11/27/2001  . Smokeless tobacco: Never Used  . Alcohol use No  . Drug use: No  . Sexual activity: No   Other Topics Concern  . Not on file   Social History Narrative   Patient is married [name]) and lives at home with her husband.   Patient has three children.   Patient is retired on disability.   Patient drinks two cups of caffeine daily.   Patient is right-handed.             Allergies  Allergen Reactions  . Mercaptopurine Other (See Comments)    Caused pancreatitis  . Remicade [Infliximab] Other (See Comments)    "joint pain"    Family History  Problem Relation Age of Onset  . Hypertension Father   . Heart disease Father   . Heart attack Father   . Diverticulitis Mother   . COPD Mother   . Hypertension Mother   . Heart disease Mother   . Heart attack Mother   . Colon cancer Brother   . Diabetes Brother   . Thyroid disease Brother       Prior to Admission medications   Medication Sig Start Date End Date Taking? Authorizing Provider  acetaminophen (TYLENOL) 500 MG tablet Take 1,000 mg by mouth every 6 (six) hours as needed for mild pain.    Yes [provider]  Adalimumab (HUMIRA) 40 MG/0.8ML PSKT Inject 40 mg into the skin every 14 (fourteen) days.   Yes [provider]  albuterol (PROVENTIL HFA;VENTOLIN HFA) 108 (90 BASE) MCG/ACT inhaler Inhale 2 puffs into the lungs every 6 (six) hours as needed for wheezing or  shortness of breath.   Yes [provider]  alendronate (FOSAMAX) 70 MG tablet Take 70 mg by mouth once a week. Mondays 11/06/16  Yes [provider]  calcitRIOL (ROCALTROL) 0.5 MCG capsule Take 0.5 mcg by mouth daily.   Yes [provider]  Cholecalciferol (VITAMIN D3) 2000 UNITS TABS Take 2,000 Units by mouth daily.  04/07/14  Yes [provider]  cyanocobalamin (,VITAMIN B-12,) 1000 MCG/ML injection Inject 1,000 mcg into the muscle See admin instructions. Every 60 days   Yes [provider]  Fluticasone-Umeclidin-Vilant (TRELEGY ELLIPTA) 100-62.5-25 MCG/INH AEPB Inhale 1 puff into the lungs daily. 05/01/16  Yes Chesley Mires, MD  folic acid (FOLVITE) 1 MG tablet Take 1 mg by mouth daily.  Yes [provider]  furosemide (LASIX) 40 MG tablet Take 0.5 tablets (20 mg total) by mouth 3 (three) times daily as needed. Patient taking differently: Take 20-40 mg by mouth 2 (two) times daily. 10m in am, 263min pm 07/15/16  Yes Sheikh, Omair Latif, DO  levothyroxine (SYNTHROID, LEVOTHROID) 125 MCG tablet Take 125 mcg by mouth every morning.    Yes [provider]  nitroGLYCERIN (NITROSTAT) 0.4 MG SL tablet Place 1 tablet (0.4 mg total) under the tongue every 5 (five) minutes as needed for chest pain. 08/19/16 11/18/16 Yes Weaver, Scott T, PA-C  potassium chloride SA (K-DUR,KLOR-CON) 20 MEQ tablet Take 60 mEq by mouth 4 (four) times daily.  09/24/15  Yes [provider]  predniSONE (DELTASONE) 5 MG tablet Take 5 mg by mouth daily with breakfast.   Yes [provider]  promethazine (PHENERGAN) 25 MG tablet Take 25 mg by mouth every 6 (six) hours as needed for nausea or vomiting.  04/19/14  Yes [provider]  sodium bicarbonate 650 MG tablet Take 1 tablet (650 mg total) by mouth 2 (two) times daily. 04/07/14  Yes Elgergawy, DaSilver HugueninMD  traMADol (ULTRAM) 50 MG tablet Take 1 tablet (50 mg total) by mouth every 6 (six) hours as  needed for severe pain. 03/17/16  Yes JoZenovia JarredDO    Physical Exam: Vitals:   11/18/16 1157 11/18/16 1200 11/18/16 1230 11/18/16 1315  BP: 137/65 136/73 130/73 (!) 115/59  Pulse: 84 82 96 79  Resp: 20   15  Temp:      TempSrc:      SpO2: 100% 100% 100% 99%  Weight:      Height:         General:  Appears calm and comfortable Eyes:  PERRL, EOMI, normal lids, iris ENT:  grossly normal hearing, lips & tongue, mmm Neck:  no LAD, masses or thyromegaly Cardiovascular:  RRR, no m/r/g. No LE edema.  Respiratory:  CTA bilaterally, no w/r/r. Normal respiratory effort. Abdomen:  soft, ntnd, NABS Skin:  no rash or induration seen on limited exam Musculoskeletal:  grossly normal tone BUE/BLE, good ROM, no bony abnormality Psychiatric:  grossly normal mood and affect, speech fluent and appropriate, AOx3 Neurologic:  CN 2-12 grossly intact, moves all extremities in coordinated fashion, sensation intact  Labs on Admission: I have personally reviewed following labs and imaging studies  CBC:  Recent Labs Lab 11/18/16 0747  WBC 10.7*  HGB 11.6*  HCT 37.4  MCV 97.9  PLT 14035  Basic Metabolic Panel:  Recent Labs Lab 11/18/16 0747  NA 139  K 3.8  CL 120*  CO2 11*  GLUCOSE 82  BUN 35*  CREATININE 3.58*  CALCIUM 8.8*   GFR: Estimated Creatinine Clearance: 10.3 mL/min (A) (by C-G formula based on SCr of 3.58 mg/dL (H)). Liver Function Tests:  Recent Labs Lab 11/18/16 0747  AST 16  ALT 11*  ALKPHOS 153*  BILITOT 0.6  PROT 6.5  ALBUMIN 3.6    Recent Labs Lab 11/18/16 0747  LIPASE 72*   No results for input(s): AMMONIA in the last 168 hours. Coagulation Profile: No results for input(s): INR, PROTIME in the last 168 hours. Cardiac Enzymes: No results for input(s): CKTOTAL, CKMB, CKMBINDEX, TROPONINI in the last 168 hours. BNP (last 3 results)  Recent Labs  07/30/16 1156  PROBNP 272   HbA1C: No results for input(s): HGBA1C in the last 72  hours. CBG: No results for input(s): GLUCAP in  the last 168 hours. Lipid Profile: No results for input(s): CHOL, HDL, LDLCALC, TRIG, CHOLHDL, LDLDIRECT in the last 72 hours. Thyroid Function Tests: No results for input(s): TSH, T4TOTAL, FREET4, T3FREE, THYROIDAB in the last 72 hours. Anemia Panel: No results for input(s): VITAMINB12, FOLATE, FERRITIN, TIBC, IRON, RETICCTPCT in the last 72 hours. Urine analysis:    Component Value Date/Time   COLORURINE YELLOW 11/18/2016 [place] 11/18/2016 0744   LABSPEC 1.013 11/18/2016 0744   PHURINE 5.0 11/18/2016 0744   GLUCOSEU NEGATIVE 11/18/2016 0744   HGBUR LARGE (A) 11/18/2016 0744   BILIRUBINUR NEGATIVE 11/18/2016 0744   KETONESUR NEGATIVE 11/18/2016 0744   PROTEINUR 30 (A) 11/18/2016 0744   UROBILINOGEN 0.2 09/09/2014 0850   NITRITE NEGATIVE 11/18/2016 0744   LEUKOCYTESUR NEGATIVE 11/18/2016 0744    Creatinine Clearance: Estimated Creatinine Clearance: 10.3 mL/min (A) (by C-G formula based on SCr of 3.58 mg/dL (H)).  Sepsis Labs: @LABRCNTIP (procalcitonin:4,lacticidven:4) )No results found for this or any previous visit (from the past 240 hour(s)).   Radiological Exams on Admission: Ct Renal Stone Study  Result Date: 11/18/2016 CLINICAL DATA:  Onset of hematuria this morning, history Crohn's disease with prior bowel resection, cholecystectomy, pancreatitis, stage IV chronic kidney disease, nephrolithiasis EXAM: CT ABDOMEN AND PELVIS WITHOUT CONTRAST TECHNIQUE: Multidetector CT imaging of the abdomen and pelvis was performed following the standard protocol without IV contrast. Sagittal and coronal MPR images reconstructed from axial data set. Oral contrast not administered for this indication. COMPARISON:  07/11/2016 FINDINGS: Lower chest: Lung bases hyperinflated but clear Hepatobiliary: Gallbladder surgically absent. Intrahepatic and extrahepatic biliary dilatation, CBD 11 mm previously 10 mm. No focal hepatic lesions.  Pancreas: Normal appearance Spleen: Normal appearance Adrenals/Urinary Tract: Numerous BILATERAL nonobstructing renal calculi. In addition, RIGHT hydronephrosis secondary to a 5 mm distal RIGHT ureteral calculus image 59, new. Probable small BILATERAL renal cysts unchanged. Bladder unremarkable. Stomach/Bowel: Appendix surgically absent. Anastomotic staple line within transverse colon. Stomach decompressed. No definite focal bowel abnormalities within limitations of exam lacking contrast. Vascular/Lymphatic: Few scattered pelvic phleboliths. Atherosclerotic calcifications aorta without aneurysm. Reproductive: Uterus surgically absent.  Nonvisualization of ovaries Other: No free air or free fluid.  No hernia. Musculoskeletal: Bones demineralized. IMPRESSION: New RIGHT hydronephrosis and hydroureter secondary to a 5 mm distal RIGHT ureteral calculus. Additional BILATERAL nonobstructing renal calculi and small renal cysts. Chronic biliary dilatation, potentially related to prior cholecystectomy, can correlate with LFTs. Aortic Atherosclerosis (ICD10-I70.0). Electronically Signed   By: Lavonia Dana M.D.   On: 11/18/2016 11:59    Assessment/Plan Active Problems:   Crohn's disease without complication (HCC)   CKD (chronic kidney disease) stage 4, GFR 15-29 ml/min (HCC)   Emphysema/COPD (HCC)   Nephrolithiasis   AKI (acute kidney injury) ([place])   Chronic diastolic CHF (congestive heart failure) ([place])   Nephrolithiasis: Right ureteral 5 mm stone noted. Patient percent chance of passing the stone with conservative measures. Urology consult by EDP who agreed to see the patient in consultation. Fortunately no evidence of concurrent infectious process. - Aggressive IVF, Flomax,  - HOld off on NSAIDs due to poor renal function - Follow-up urology consultation - KUB in am if still symptomatic - Strain urine  Abnormal urinalysis: Likely secondary to inflammation and injury to the ureter. - Urine culture -  Treatment as above  AoCKD: Cr. 3.58. Baseline 3. Stage 4-5.  Pt followed actively by renal. Pt has previously been evaluated for AV fistula but renal function holding.  - IVF as above.  - BMP in  am - Continue renal supplements  Chronic diastolic congestive heart failure: Last echo showing an EF of 60-65% and grade 1 diastolic dysfunction. Well compensated. - Strict I's and O's, daily weights - Continue baseline Lasix  COPD: advanced. No O2 requirement. At baseline - continue Trelegy Ellipta,   Crohn's: - Continue Humira, Prednisone.   Hypothyroidism: - continue synthroid  DVT prophylaxis: SCD  Code Status: full  Family Communication: husband  Disposition Plan: pending improvement  Consults called: [facility] Admission status: observation    MERRELL, DAVID J MD [facility]  If 7PM-7AM, please contact night-coverage [email or web address] Password [facility]  11/18/2016, 2:37 PM

## 2016-11-18 NOTE — Consult Note (Signed)
I have reviewed the patient's CAT scan.  At this point, in speaking with the emergency room physician, admitting the patient to the hospital service for IV hydration and pain control  Is reasonable.  We'll plan to see her later this afternoon.  Likely making her nothing by mouth midnight with a repeat KUB to reassess her stone location.  She likely will pass this on her own with some time. If she can tolerate Flomax, be also a good medication for her.

## 2016-11-18 NOTE — ED Notes (Signed)
Contacted pharmacy to verify medications at this time.

## 2016-11-18 NOTE — ED Provider Notes (Signed)
[facility] DEPT Provider Note   CSN: [id number] Arrival date & time: 11/18/16  0703     History   Chief Complaint Chief Complaint  Patient presents with  . Emesis  . Hematuria    HPI Natasha [name] is a 71 y.o. female hx of CKD, Hypertension, Crohn's disease chronic prednisone, previous small bowel obstruction, here presenting with acute onset of right flank pain, hematuria. Patient states that she was fine until last night where she has acute onset of right flank pain the right upper quadrant pain. It is associated with severe nausea but no vomiting. Patient states that she also had some hematuria which is new for her. She denies any diarrhea and is still passing gas. She states that she was admitted for Crohn's disease flare several months ago but had different symptoms at that time. Denies any fevers or chills. Denies any history of kidney stones.  The history is provided by the patient.    Past Medical History:  Diagnosis Date  . Anal fistula   . Anemia in chronic kidney disease (CKD)   . Aortic insufficiency    Echo 3/18: mod conc LVH, EF 60-65, no RWMA, Gr 1 DD, mod AI, mild MR, normal RVSF, Trivial TR  . Arthritis   . Borderline hypertension   . Bulging disc    L3-L4  . Chronic diarrhea    due to crohn's  . CKD (chronic kidney disease), stage IV ([place])    NEPHROLOGIST--  DR MATTINGLY  . Crohn's disease ([place])    chronic ileitis  . Emphysema/COPD ([place])   . GERD (gastroesophageal reflux disease)   . History of glaucoma   . History of pancreatitis    2008--  mercaptopurine  . History of small bowel obstruction    12-03-2010  due to crohn's ileitis  . Hypothyroidism, postsurgical    multinodule w/ hurthle cells  . Nephrolithiasis    bilateral  . Osteoporosis   . Perianal Crohn's disease ([place])   . Polyarthralgia   . Wears partial dentures    upper    Patient Active Problem List   Diagnosis Date Noted  . Aortic insufficiency 07/30/2016  .  Atherosclerosis of aorta ([place]) 07/30/2016  . Pressure injury of skin 07/14/2016  . Gastroenteritis, acute 07/12/2016  . COPD (chronic obstructive pulmonary disease) ([place]) 10/12/2015  . Malnutrition of moderate degree ([place]) 09/13/2014  . history of Left leg DVT 09/10/2014  . Hypotension 09/10/2014  . Hypoglycemia 09/10/2014  . Ischiorectal abscess 09/09/2014  . Increased anion gap metabolic acidosis   . Generalized abdominal pain   . Chronic diastolic CHF (congestive heart failure) ([place])   . Hypertensive heart disease   . Acute on chronic renal failure ([place])   . Secondary hyperparathyroidism ([place])   . Other specified hypothyroidism   . Right shoulder pain   . Thrombocytopenia ([place])   . Severe protein-calorie malnutrition ([place])   . Thyroid activity decreased   . Emphysema of lung ([place])   . Nephrolithiasis 03/29/2014  . AKI (acute kidney injury) ([place]) 03/29/2014  . Spinal stenosis of lumbar region 11/01/2013  . Bilateral leg pain 09/28/2013  . Emphysema/COPD ([place])   . Dyspnea 09/20/2013  . Protein-calorie malnutrition, severe ([place]) 05/14/2013  . Salmonella enteritidis 06/11/2012  . Hypocalcemia 06/10/2012  . Hypothyroidism 06/10/2012  . Anemia of chronic disease 06/09/2012  . Hypomagnesemia 06/09/2012  . CKD (chronic kidney disease) stage 4, GFR 15-29 ml/min (HCC) 06/09/2012  . Acute renal failure ([place]) 06/07/2012  . Dehydration 06/07/2012  .  Crohn's disease without complication ([place]) 25/36/6440  . Metabolic acidosis 34/74/2595  . Hypokalemia 06/07/2012  . Diarrhea 06/07/2012  . UTI (lower urinary tract infection) 06/07/2012    Past Surgical History:  Procedure Laterality Date  . ABDOMINAL HYSTERECTOMY  1990   and  Appendectomy  . COLON RESECTION  x3 --  1978,  1987, 1989   ILEAL RESECTION x2/   [place]  . COLONOSCOPY WITH PROPOFOL N/A 09/25/2014   Procedure: COLONOSCOPY WITH PROPOFOL;  Surgeon: Garlan Fair, MD;  Location: [facility];  Service:  Endoscopy;  Laterality: N/A;  . ESOPHAGOGASTRODUODENOSCOPY  03/04/2012   Procedure: ESOPHAGOGASTRODUODENOSCOPY (EGD);  Surgeon: Garlan Fair, MD;  Location: [facility];  Service: Endoscopy;  Laterality: N/A;  . EXAMINATION UNDER ANESTHESIA N/A 09/12/2014   Procedure: EXAM UNDER ANESTHESIA;  Surgeon: Rolm Bookbinder, MD;  Location: [facility];  Service: General;  Laterality: N/A;  . FLEXIBLE SIGMOIDOSCOPY  03/04/2012   Procedure: FLEXIBLE SIGMOIDOSCOPY;  Surgeon: Garlan Fair, MD;  Location: [facility];  Service: Endoscopy;  Laterality: N/A;  . GLAUCOMA SURGERY Bilateral   . INCISION AND DRAINAGE PERIRECTAL ABSCESS N/A 09/12/2014   Procedure: IRRIGATION AND DEBRIDEMENT PERIRECTAL ABSCESS;  Surgeon: Rolm Bookbinder, MD;  Location: [facility];  Service: General;  Laterality: N/A;  . NEGATIVE SLEEP STUDY  2008  . PLACEMENT OF SETON N/A 12/01/2014   Procedure: PLACEMENT OF SETON;  Surgeon: [name], MD;  Location: [facility];  Service: General;  Laterality: N/A;  . RECTAL EXAM UNDER ANESTHESIA N/A 12/01/2014   Procedure: RECTAL EXAM UNDER ANESTHESIA;  Surgeon: [name], MD;  Location: [facility];  Service: General;  Laterality: N/A;  . TOTAL THYROIDECTOMY  10-13-2003  . TRANSTHORACIC ECHOCARDIOGRAM  07-11-2013   mild LVH,  ef 55%,  moderate AR,  mild MR and TR,  trivial PR    OB History    No data available       Home Medications    Prior to Admission medications   Medication Sig Start Date End Date Taking? Authorizing Provider  acetaminophen (TYLENOL) 500 MG tablet Take 1,000 mg by mouth every 6 (six) hours as needed for mild pain.    Yes [provider]  Adalimumab (HUMIRA) 40 MG/0.8ML PSKT Inject 40 mg into the skin every 14 (fourteen) days.   Yes [provider]  albuterol (PROVENTIL HFA;VENTOLIN HFA) 108 (90 BASE) MCG/ACT inhaler Inhale 2 puffs into the lungs every 6 (six) hours as needed for wheezing or shortness of breath.    Yes [provider]  alendronate (FOSAMAX) 70 MG tablet Take 70 mg by mouth once a week. Mondays 11/06/16  Yes [provider]  calcitRIOL (ROCALTROL) 0.5 MCG capsule Take 0.5 mcg by mouth daily.   Yes [provider]  Cholecalciferol (VITAMIN D3) 2000 UNITS TABS Take 2,000 Units by mouth daily.  04/07/14  Yes [provider]  cyanocobalamin (,VITAMIN B-12,) 1000 MCG/ML injection Inject 1,000 mcg into the muscle See admin instructions. Every 60 days   Yes [provider]  Fluticasone-Umeclidin-Vilant (TRELEGY ELLIPTA) 100-62.5-25 MCG/INH AEPB Inhale 1 puff into the lungs daily. 05/01/16  Yes Chesley Mires, MD  folic acid (FOLVITE) 1 MG tablet Take 1 mg by mouth daily.   Yes [provider]  furosemide (LASIX) 40 MG tablet Take 0.5 tablets (20 mg total) by mouth 3 (three) times daily as needed. Patient taking differently: Take 20-40 mg by mouth 2 (two) times daily. 34m in am, 23min pm  07/15/16  Yes Sheikh, Omair Latif, DO  levothyroxine (SYNTHROID, LEVOTHROID) 125 MCG tablet Take 125 mcg by mouth every morning.    Yes [provider]  nitroGLYCERIN (NITROSTAT) 0.4 MG SL tablet Place 1 tablet (0.4 mg total) under the tongue every 5 (five) minutes as needed for chest pain. 08/19/16 11/18/16 Yes Weaver, Scott T, PA-C  potassium chloride SA (K-DUR,KLOR-CON) 20 MEQ tablet Take 60 mEq by mouth 4 (four) times daily.  09/24/15  Yes [provider]  predniSONE (DELTASONE) 5 MG tablet Take 5 mg by mouth daily with breakfast.   Yes [provider]  promethazine (PHENERGAN) 25 MG tablet Take 25 mg by mouth every 6 (six) hours as needed for nausea or vomiting.  04/19/14  Yes [provider]  sodium bicarbonate 650 MG tablet Take 1 tablet (650 mg total) by mouth 2 (two) times daily. 04/07/14  Yes Elgergawy, Silver Huguenin, MD  traMADol (ULTRAM) 50 MG tablet Take 1 tablet (50 mg total) by mouth every 6 (six) hours as needed for severe  pain. 03/17/16  Yes Zenovia Jarred, DO    Family History Family History  Problem Relation Age of Onset  . Hypertension Father   . Heart disease Father   . Heart attack Father   . Diverticulitis Mother   . COPD Mother   . Hypertension Mother   . Heart disease Mother   . Heart attack Mother   . Colon cancer Brother   . Diabetes Brother   . Thyroid disease Brother     Social History Social History  Substance Use Topics  . Smoking status: Former Smoker    Packs/day: 0.25    Years: 15.00    Types: Cigarettes    Quit date: 11/27/2001  . Smokeless tobacco: Never Used  . Alcohol use No     Allergies   Mercaptopurine and Remicade [infliximab]   Review of Systems Review of Systems  Gastrointestinal: Positive for abdominal pain and vomiting.  Genitourinary: Positive for hematuria.  All other systems reviewed and are negative.    Physical Exam Updated Vital Signs BP 130/73   Pulse 96   Temp 97.6 F (36.4 C) (Oral)   Resp 20   Ht 5' 4"  (1.626 m)   Wt 44.5 kg (98 lb)   SpO2 100%   BMI 16.82 kg/m   Physical Exam  Constitutional: She is oriented to person, place, and time.  Uncomfortable, dehydrated   HENT:  Head: Normocephalic.  MM dry   Eyes: Conjunctivae and EOM are normal. Pupils are equal, round, and reactive to light.  Neck: Normal range of motion. Neck supple.  Cardiovascular: Normal rate, regular rhythm and normal heart sounds.   Pulmonary/Chest: Effort normal and breath sounds normal. No respiratory distress. She has no wheezes.  Abdominal: Soft.  + RUQ tenderness, no murphy. + R CVAT   Musculoskeletal: Normal range of motion.  Neurological: She is alert and oriented to person, place, and time.  Skin: Skin is warm.  Psychiatric: She has a normal mood and affect.  Nursing note and vitals reviewed.    ED Treatments / Results  Labs (all labs ordered are listed, but only abnormal results are displayed) Labs Reviewed  LIPASE, BLOOD - Abnormal;  Notable for the following:       Result Value   Lipase 72 (*)    All other components within normal limits  COMPREHENSIVE METABOLIC PANEL - Abnormal; Notable for the following:    Chloride 120 (*)  CO2 11 (*)    BUN 35 (*)    Creatinine, Ser 3.58 (*)    Calcium 8.8 (*)    ALT 11 (*)    Alkaline Phosphatase 153 (*)    GFR calc non Af Amer 12 (*)    GFR calc Af Amer 14 (*)    All other components within normal limits  CBC - Abnormal; Notable for the following:    WBC 10.7 (*)    RBC 3.82 (*)    Hemoglobin 11.6 (*)    RDW 16.0 (*)    Platelets 148 (*)    All other components within normal limits  URINALYSIS, ROUTINE W REFLEX MICROSCOPIC - Abnormal; Notable for the following:    Hgb urine dipstick LARGE (*)    Protein, ur 30 (*)    Bacteria, UA FEW (*)    Squamous Epithelial / LPF 0-5 (*)    All other components within normal limits  URINE CULTURE    EKG  EKG Interpretation None       Radiology Ct Renal Stone Study  Result Date: 11/18/2016 CLINICAL DATA:  Onset of hematuria this morning, history Crohn's disease with prior bowel resection, cholecystectomy, pancreatitis, stage IV chronic kidney disease, nephrolithiasis EXAM: CT ABDOMEN AND PELVIS WITHOUT CONTRAST TECHNIQUE: Multidetector CT imaging of the abdomen and pelvis was performed following the standard protocol without IV contrast. Sagittal and coronal MPR images reconstructed from axial data set. Oral contrast not administered for this indication. COMPARISON:  07/11/2016 FINDINGS: Lower chest: Lung bases hyperinflated but clear Hepatobiliary: Gallbladder surgically absent. Intrahepatic and extrahepatic biliary dilatation, CBD 11 mm previously 10 mm. No focal hepatic lesions. Pancreas: Normal appearance Spleen: Normal appearance Adrenals/Urinary Tract: Numerous BILATERAL nonobstructing renal calculi. In addition, RIGHT hydronephrosis secondary to a 5 mm distal RIGHT ureteral calculus image 59, new. Probable small  BILATERAL renal cysts unchanged. Bladder unremarkable. Stomach/Bowel: Appendix surgically absent. Anastomotic staple line within transverse colon. Stomach decompressed. No definite focal bowel abnormalities within limitations of exam lacking contrast. Vascular/Lymphatic: Few scattered pelvic phleboliths. Atherosclerotic calcifications aorta without aneurysm. Reproductive: Uterus surgically absent.  Nonvisualization of ovaries Other: No free air or free fluid.  No hernia. Musculoskeletal: Bones demineralized. IMPRESSION: New RIGHT hydronephrosis and hydroureter secondary to a 5 mm distal RIGHT ureteral calculus. Additional BILATERAL nonobstructing renal calculi and small renal cysts. Chronic biliary dilatation, potentially related to prior cholecystectomy, can correlate with LFTs. Aortic Atherosclerosis (ICD10-I70.0). Electronically Signed   By: Lavonia Dana M.D.   On: 11/18/2016 11:59    Procedures Procedures (including critical care time)  Medications Ordered in ED Medications  ondansetron (ZOFRAN-ODT) 4 MG disintegrating tablet (not administered)  ondansetron (ZOFRAN-ODT) disintegrating tablet 4 mg (4 mg Oral Given 11/18/16 0743)  sodium chloride 0.9 % bolus 1,000 mL (0 mLs Intravenous Stopped 11/18/16 1133)  morphine 4 MG/ML injection 4 mg (4 mg Intravenous Given 11/18/16 0940)  ondansetron (ZOFRAN) injection 4 mg (4 mg Intravenous Given 11/18/16 0940)  morphine 4 MG/ML injection 4 mg (4 mg Intravenous Given 11/18/16 1239)     Initial Impression / Assessment and Plan / ED Course  I have reviewed the triage vital signs and the nursing notes.  Pertinent labs & imaging results that were available during my care of the patient were reviewed by me and considered in my medical decision making (see chart for details).     Natasha [name] is a 71 y.o. female here with abdominal pain, flank pain, hematuria. Consider renal colic. Had previous cholecystectomy. Consider pyelo vs  crohn's flare vs SBO as  well. Will get labs, UA, CT renal stone. Will give pain meds and hydrate and reassess.   1:24 PM Cr 3.5, worse compared to baseline. UA showed no UTI. Still in pain despite 2 rounds of pain meds, IVF. CT showed 5 mm stone with hydro. Consulted Dr. Louis Meckel from urology. He recommend pain meds, IVF, urine culture and KUB tomorrow AM and NPO after midnight. He will see patient at [facility] today. Recommend admission to hospitalist service for pain control, IVF.    Final Clinical Impressions(s) / ED Diagnoses   Final diagnoses:  None    New Prescriptions New Prescriptions   No medications on file     Drenda Freeze, MD 11/18/16 1325

## 2016-11-18 NOTE — ED Notes (Signed)
Patient transported to CT 

## 2016-11-19 ENCOUNTER — Encounter (HOSPITAL_COMMUNITY): Payer: Self-pay | Admitting: Urology

## 2016-11-19 ENCOUNTER — Observation Stay (HOSPITAL_COMMUNITY): Payer: Medicare Other | Admitting: Registered Nurse

## 2016-11-19 ENCOUNTER — Observation Stay (HOSPITAL_COMMUNITY): Payer: Medicare Other

## 2016-11-19 ENCOUNTER — Encounter (HOSPITAL_COMMUNITY)
Admission: EM | Disposition: A | Discharge status: Home / Self Care | Payer: Self-pay | Source: Home / Self Care | Attending: Emergency Medicine

## 2016-11-19 DIAGNOSIS — J439 Emphysema, unspecified: Secondary | ICD-10-CM | POA: Diagnosis not present

## 2016-11-19 DIAGNOSIS — I130 Hypertensive heart and chronic kidney disease with heart failure and stage 1 through stage 4 chronic kidney disease, or unspecified chronic kidney disease: Secondary | ICD-10-CM | POA: Diagnosis not present

## 2016-11-19 DIAGNOSIS — N132 Hydronephrosis with renal and ureteral calculous obstruction: Secondary | ICD-10-CM | POA: Diagnosis not present

## 2016-11-19 DIAGNOSIS — I5032 Chronic diastolic (congestive) heart failure: Secondary | ICD-10-CM | POA: Diagnosis not present

## 2016-11-19 DIAGNOSIS — N179 Acute kidney failure, unspecified: Secondary | ICD-10-CM | POA: Diagnosis not present

## 2016-11-19 DIAGNOSIS — I700 Atherosclerosis of aorta: Secondary | ICD-10-CM | POA: Diagnosis not present

## 2016-11-19 HISTORY — PX: CYSTOSCOPY W/ URETERAL STENT PLACEMENT: SHX1429

## 2016-11-19 HISTORY — PX: HOLMIUM LASER APPLICATION: SHX5852

## 2016-11-19 LAB — CBC
HEMATOCRIT: 32.6 % — AB (ref 36.0–46.0)
Hemoglobin: 10 g/dL — ABNORMAL LOW (ref 12.0–15.0)
MCH: 30.6 pg (ref 26.0–34.0)
MCHC: 30.7 g/dL (ref 30.0–36.0)
MCV: 99.7 fL (ref 78.0–100.0)
PLATELETS: 129 10*3/uL — AB (ref 150–400)
RBC: 3.27 MIL/uL — ABNORMAL LOW (ref 3.87–5.11)
RDW: 16 % — AB (ref 11.5–15.5)
WBC: 8.7 10*3/uL (ref 4.0–10.5)

## 2016-11-19 LAB — BASIC METABOLIC PANEL
Anion gap: 4 — ABNORMAL LOW (ref 5–15)
BUN: 36 mg/dL — ABNORMAL HIGH (ref 6–20)
CALCIUM: 8.1 mg/dL — AB (ref 8.9–10.3)
CO2: 11 mmol/L — ABNORMAL LOW (ref 22–32)
CREATININE: 4.19 mg/dL — AB (ref 0.44–1.00)
Chloride: 124 mmol/L — ABNORMAL HIGH (ref 101–111)
GFR, EST AFRICAN AMERICAN: 11 mL/min — AB (ref >60)
GFR, EST NON AFRICAN AMERICAN: 10 mL/min — AB (ref >60)
Glucose, Bld: 75 mg/dL (ref 65–99)
Potassium: 5.1 mmol/L (ref 3.5–5.1)
SODIUM: 139 mmol/L (ref 135–145)

## 2016-11-19 LAB — URINE CULTURE

## 2016-11-19 LAB — SURGICAL PCR SCREEN
MRSA, PCR: NEGATIVE
STAPHYLOCOCCUS AUREUS: NEGATIVE

## 2016-11-19 SURGERY — CYSTOSCOPY, WITH RETROGRADE PYELOGRAM AND URETERAL STENT INSERTION
Anesthesia: General | Site: Ureter | Laterality: Right

## 2016-11-19 MED ORDER — TRAMADOL HCL 50 MG PO TABS: 50.0000 mg | ORAL_TABLET | Freq: Four times a day (QID) | ORAL | 0 refills | Status: DC | PRN

## 2016-11-19 MED ORDER — ONDANSETRON HCL 4 MG/2ML IJ SOLN
INTRAMUSCULAR | Status: AC
  Filled 2016-11-19: qty 2

## 2016-11-19 MED ORDER — IOHEXOL 300 MG/ML  SOLN
INTRAMUSCULAR | Status: DC | PRN
  Administered 2016-11-19: 10 mL via URETHRAL

## 2016-11-19 MED ORDER — BELLADONNA ALKALOIDS-OPIUM 16.2-60 MG RE SUPP
RECTAL | Status: DC | PRN
  Administered 2016-11-19: 1 via RECTAL

## 2016-11-19 MED ORDER — ACETAMINOPHEN 10 MG/ML IV SOLN
INTRAVENOUS | Status: AC
  Filled 2016-11-19: qty 100

## 2016-11-19 MED ORDER — PROPOFOL 10 MG/ML IV BOLUS
INTRAVENOUS | Status: DC | PRN
  Administered 2016-11-19: 90 mg via INTRAVENOUS

## 2016-11-19 MED ORDER — LIDOCAINE HCL (CARDIAC) 10 MG/ML IV SOLN
INTRAVENOUS | Status: DC | PRN
  Administered 2016-11-19: 40 mg via INTRAVENOUS

## 2016-11-19 MED ORDER — 0.9 % SODIUM CHLORIDE (POUR BTL) OPTIME
TOPICAL | Status: DC | PRN
  Administered 2016-11-19: 1000 mL

## 2016-11-19 MED ORDER — LIDOCAINE 2% (20 MG/ML) 5 ML SYRINGE
INTRAMUSCULAR | Status: AC
  Filled 2016-11-19: qty 5

## 2016-11-19 MED ORDER — FENTANYL CITRATE (PF) 100 MCG/2ML IJ SOLN: 25.0000 ug | INTRAMUSCULAR | Status: DC | PRN

## 2016-11-19 MED ORDER — DEXAMETHASONE SODIUM PHOSPHATE 10 MG/ML IJ SOLN
INTRAMUSCULAR | Status: AC
  Filled 2016-11-19: qty 1

## 2016-11-19 MED ORDER — PHENYLEPHRINE HCL 10 MG/ML IJ SOLN
INTRAMUSCULAR | Status: AC
  Filled 2016-11-19: qty 1

## 2016-11-19 MED ORDER — PROPOFOL 10 MG/ML IV BOLUS
INTRAVENOUS | Status: AC
  Filled 2016-11-19: qty 40

## 2016-11-19 MED ORDER — CEFAZOLIN SODIUM-DEXTROSE 2-4 GM/100ML-% IV SOLN
INTRAVENOUS | Status: AC
  Filled 2016-11-19: qty 100

## 2016-11-19 MED ORDER — CEFAZOLIN SODIUM-DEXTROSE 2-4 GM/100ML-% IV SOLN
2.0000 g | Freq: Once | INTRAVENOUS | Status: AC
  Administered 2016-11-19: 2 g via INTRAVENOUS

## 2016-11-19 MED ORDER — SODIUM CHLORIDE 0.9 % IR SOLN
Status: DC | PRN
  Administered 2016-11-19: 6000 mL

## 2016-11-19 MED ORDER — DEXAMETHASONE SODIUM PHOSPHATE 4 MG/ML IJ SOLN
INTRAMUSCULAR | Status: DC | PRN
  Administered 2016-11-19: 8 mg via INTRAVENOUS

## 2016-11-19 MED ORDER — ONDANSETRON HCL 4 MG/2ML IJ SOLN
INTRAMUSCULAR | Status: DC | PRN
  Administered 2016-11-19: 4 mg via INTRAVENOUS

## 2016-11-19 MED ORDER — PHENYLEPHRINE HCL 10 MG/ML IJ SOLN
INTRAVENOUS | Status: DC | PRN
  Administered 2016-11-19: 20 ug/min via INTRAVENOUS

## 2016-11-19 MED ORDER — LACTATED RINGERS IV SOLN: INTRAVENOUS | Status: DC

## 2016-11-19 MED ORDER — FENTANYL CITRATE (PF) 100 MCG/2ML IJ SOLN
INTRAMUSCULAR | Status: DC | PRN
  Administered 2016-11-19: 50 ug via INTRAVENOUS
  Administered 2016-11-19: 25 ug via INTRAVENOUS
  Administered 2016-11-19: 50 ug via INTRAVENOUS
  Administered 2016-11-19: 25 ug via INTRAVENOUS

## 2016-11-19 MED ORDER — SODIUM CHLORIDE 0.9 % IV SOLN
INTRAVENOUS | Status: AC
  Administered 2016-11-19 (×2): via INTRAVENOUS

## 2016-11-19 MED ORDER — FENTANYL CITRATE (PF) 250 MCG/5ML IJ SOLN
INTRAMUSCULAR | Status: AC
  Filled 2016-11-19: qty 5

## 2016-11-19 MED ORDER — CEPHALEXIN 500 MG PO CAPS: 500.0000 mg | ORAL_CAPSULE | Freq: Once | ORAL | 0 refills | Status: AC

## 2016-11-19 SURGICAL SUPPLY — 24 items
BAG URO CATCHER STRL LF (MISCELLANEOUS) ×3 IMPLANT
BASKET DAKOTA 1.9FR 11X120 (BASKET) IMPLANT
BASKET ZERO TIP NITINOL 2.4FR (BASKET) IMPLANT
BSKT STON RTRVL ZERO TP 2.4FR (BASKET)
CATH URET 5FR 28IN OPEN ENDED (CATHETERS) ×3 IMPLANT
CLOTH BEACON ORANGE TIMEOUT ST (SAFETY) ×3 IMPLANT
COVER SURGICAL LIGHT HANDLE (MISCELLANEOUS) ×3 IMPLANT
FIBER LASER FLEXIVA 365 (UROLOGICAL SUPPLIES) ×3 IMPLANT
GLOVE BIOGEL M STRL SZ7.5 (GLOVE) ×3 IMPLANT
GOWN STRL REUS W/TWL XL LVL3 (GOWN DISPOSABLE) ×7 IMPLANT
GUIDEWIRE ANG ZIPWIRE 038X150 (WIRE) IMPLANT
GUIDEWIRE STR DUAL SENSOR (WIRE) ×6 IMPLANT
IV NS IRRIG 3000ML ARTHROMATIC (IV SOLUTION) ×4 IMPLANT
MANIFOLD NEPTUNE II (INSTRUMENTS) ×3 IMPLANT
NS IRRIG 1000ML POUR BTL (IV SOLUTION) ×2 IMPLANT
PACK CYSTO (CUSTOM PROCEDURE TRAY) ×3 IMPLANT
SHEATH ACCESS URETERAL 24CM (SHEATH) IMPLANT
SHEATH ACCESS URETERAL 38CM (SHEATH) IMPLANT
SHEATH ACCESS URETERAL 54CM (SHEATH) IMPLANT
STENT URET 6FRX24 CONTOUR (STENTS) ×2 IMPLANT
TUBING CONNECTING 10 (TUBING) ×2 IMPLANT
TUBING CONNECTING 10' (TUBING) ×1
WATER STERILE IRR 500ML POUR (IV SOLUTION) ×2 IMPLANT
WIRE COONS/BENSON .038X145CM (WIRE) IMPLANT

## 2016-11-19 NOTE — Progress Notes (Addendum)
RN notified the Hospitalist service for tonight that the patient has been discharged by Dr. Louis Meckel. The patient is in PACU awaiting return to room 1415. Awaiting new orders/notification from PCP on call.

## 2016-11-19 NOTE — Progress Notes (Signed)
Patient is hungry. RN notified the PCP and asked if we may advance the diet. Awaiting any new orders.

## 2016-11-19 NOTE — Transfer of Care (Signed)
Immediate Anesthesia Transfer of Care Note  Patient: Natasha [name]  Procedure(s) Performed: Procedure(s): CYSTOSCOPY WITH RIGHT RETROGRADE PYELOGRAM/ URETEROSCOPY WITH LASER AND RIGHT URETERAL STENT PLACEMENT (Right) HOLMIUM LASER APPLICATION (Right)  Patient Location: PACU  Anesthesia Type:General  Level of Consciousness: awake, alert  and oriented  Airway & Oxygen Therapy: Patient Spontanous Breathing and Patient connected to face mask oxygen  Post-op Assessment: Report given to RN and Post -op Vital signs reviewed and stable  Post vital signs: Reviewed and stable  Last Vitals:  Vitals:   11/19/16 0958 11/19/16 1219  BP: (!) 94/52 (!) 111/44  Pulse: 86 82  Resp: 16 18  Temp:  37.2 C    Last Pain:  Vitals:   11/19/16 1247  TempSrc:   PainSc: 6       Patients Stated Pain Goal: 6 (51/46/04 7998)  Complications: No apparent anesthesia complications

## 2016-11-19 NOTE — Discharge Summary (Signed)
Date of admission: 11/18/2016  Date of discharge: 11/19/2016  Admission diagnosis: right obstructing ureteral stone, AKI/CKI  Discharge diagnosis: as above  Secondary diagnoses:  Patient Active Problem List   Diagnosis Date Noted  . Aortic insufficiency 07/30/2016  . Atherosclerosis of aorta ([place]) 07/30/2016  . Pressure injury of skin 07/14/2016  . Gastroenteritis, acute 07/12/2016  . COPD (chronic obstructive pulmonary disease) ([place]) 10/12/2015  . Malnutrition of moderate degree ([place]) 09/13/2014  . history of Left leg DVT 09/10/2014  . Hypotension 09/10/2014  . Hypoglycemia 09/10/2014  . Ischiorectal abscess 09/09/2014  . Increased anion gap metabolic acidosis   . Generalized abdominal pain   . Chronic diastolic CHF (congestive heart failure) ([place])   . Hypertensive heart disease   . Acute on chronic renal failure ([place])   . Secondary hyperparathyroidism ([place])   . Other specified hypothyroidism   . Right shoulder pain   . Thrombocytopenia ([place])   . Severe protein-calorie malnutrition ([place])   . Thyroid activity decreased   . Emphysema of lung ([place])   . Nephrolithiasis 03/29/2014  . AKI (acute kidney injury) ([place]) 03/29/2014  . Spinal stenosis of lumbar region 11/01/2013  . Bilateral leg pain 09/28/2013  . Emphysema/COPD ([place])   . Dyspnea 09/20/2013  . Protein-calorie malnutrition, severe ([place]) 05/14/2013  . Salmonella enteritidis 06/11/2012  . Hypocalcemia 06/10/2012  . Hypothyroidism 06/10/2012  . Anemia of chronic disease 06/09/2012  . Hypomagnesemia 06/09/2012  . CKD (chronic kidney disease) stage 4, GFR 15-29 ml/min (HCC) 06/09/2012  . Acute renal failure ([place]) 06/07/2012  . Dehydration 06/07/2012  . Crohn's disease without complication ([place]) 25/95/6387  . Metabolic acidosis 56/43/3295  . Hypokalemia 06/07/2012  . Diarrhea 06/07/2012  . UTI (lower urinary tract infection) 06/07/2012    Procedures performed: Procedure(s): CYSTOSCOPY WITH RIGHT RETROGRADE  PYELOGRAM/ URETEROSCOPY WITH LASER AND RIGHT URETERAL STENT PLACEMENT HOLMIUM LASER APPLICATION  History and Physical: For full details, please see admission history and physical. Briefly, Natasha [name] is a 71 y.o. year old patient with right distal obstructing stone.  She presented with flank pain and nausea and vomiting.  She was found to have acute kidney injury.  She was admitted for observation and her renal function worsened.  As such we opted to proceed to the OR for right ureteroscopy.   Hospital Course: The patient was admitted to the hospital service at [facility].  She underwent a KUB on hospital day 2 noting no progression of the stone.  Her renal function was noted to worsen.  As such he was transferred to [facility] on to the urology service and subsequently underwent the above procedure.  Patient tolerated the procedure well.  She was then transferred to the floor after an uneventful PACU stay.  Her hospital course was uncomplicated.  On POD#0 she had met discharge criteria: was eating a regular diet, was up and ambulating independently,  pain was well controlled, was voiding without a catheter, and was ready to for discharge.  The patient will follow-up with her nephrologist for repeat labs in the next several days.   Laboratory values:   Recent Labs  11/18/16 0747 11/19/16 0859  WBC 10.7* 8.7  HGB 11.6* 10.0*  HCT 37.4 32.6*    Recent Labs  11/18/16 0747 11/19/16 0859  NA 139 139  K 3.8 5.1  CL 120* 124*  CO2 11* 11*  GLUCOSE 82 75  BUN 35* 36*  CREATININE 3.58* 4.19*  CALCIUM 8.8* 8.1*   No results for input(s): LABPT,  INR in the last 72 hours. No results for input(s): LABURIN in the last 72 hours. Results for orders placed or performed during the hospital encounter of 11/18/16  Urine culture     Status: Abnormal   Collection Time: 11/18/16  7:44 AM  Result Value Ref Range Status   Specimen Description URINE, RANDOM  Final   Special Requests NONE  Final    Culture MULTIPLE SPECIES PRESENT, SUGGEST RECOLLECTION (A)  Final   Report Status 11/19/2016 FINAL  Final  Surgical pcr screen     Status: None   Collection Time: 11/19/16  1:13 PM  Result Value Ref Range Status   MRSA, PCR NEGATIVE NEGATIVE Final   Staphylococcus aureus NEGATIVE NEGATIVE Final    Comment:        The Xpert SA Assay (FDA approved for NASAL specimens in patients over 34 years of age), is one component of a comprehensive surveillance program.  Test performance has been validated by [facility] for patients greater than or equal to 11 year old. It is not intended to diagnose infection nor to guide or monitor treatment.     Disposition: Home  Discharge instruction: The patient was instructed to be ambulatory but told to refrain from heavy lifting, strenuous activity, or driving.  Discharge medications: Allergies as of 11/19/2016      Reactions   Mercaptopurine Other (See Comments)   Caused pancreatitis   Remicade [infliximab] Other (See Comments)   "joint pain"      Medication List    TAKE these medications   acetaminophen 500 MG tablet Commonly known as:  TYLENOL Take 1,000 mg by mouth every 6 (six) hours as needed for mild pain.   albuterol 108 (90 Base) MCG/ACT inhaler Commonly known as:  PROVENTIL HFA;VENTOLIN HFA Inhale 2 puffs into the lungs every 6 (six) hours as needed for wheezing or shortness of breath.   alendronate 70 MG tablet Commonly known as:  FOSAMAX Take 70 mg by mouth once a week. Mondays   calcitRIOL 0.5 MCG capsule Commonly known as:  ROCALTROL Take 0.5 mcg by mouth daily.   cephALEXin 500 MG capsule Commonly known as:  KEFLEX Take 1 capsule (500 mg total) by mouth once. Take one hour prior to stent removal   cyanocobalamin 1000 MCG/ML injection Commonly known as:  (VITAMIN B-12) Inject 1,000 mcg into the muscle See admin instructions. Every 60 days   Fluticasone-Umeclidin-Vilant 100-62.5-25 MCG/INH Aepb Commonly known  as:  TRELEGY ELLIPTA Inhale 1 puff into the lungs daily.   folic acid 1 MG tablet Commonly known as:  FOLVITE Take 1 mg by mouth daily.   furosemide 40 MG tablet Commonly known as:  LASIX Take 0.5 tablets (20 mg total) by mouth 3 (three) times daily as needed. What changed:  how much to take  when to take this  additional instructions   HUMIRA 40 MG/0.8ML Pskt Generic drug:  Adalimumab Inject 40 mg into the skin every 14 (fourteen) days.   levothyroxine 125 MCG tablet Commonly known as:  SYNTHROID, LEVOTHROID Take 125 mcg by mouth every morning.   nitroGLYCERIN 0.4 MG SL tablet Commonly known as:  NITROSTAT Place 1 tablet (0.4 mg total) under the tongue every 5 (five) minutes as needed for chest pain.   potassium chloride SA 20 MEQ tablet Commonly known as:  K-DUR,KLOR-CON Take 60 mEq by mouth 3 (three) times daily.   predniSONE 5 MG tablet Commonly known as:  DELTASONE Take 5 mg by mouth daily with breakfast.  promethazine 25 MG tablet Commonly known as:  PHENERGAN Take 25 mg by mouth every 6 (six) hours as needed for nausea or vomiting.   sodium bicarbonate 650 MG tablet Take 1 tablet (650 mg total) by mouth 2 (two) times daily.   traMADol 50 MG tablet Commonly known as:  ULTRAM Take 1-2 tablets (50-100 mg total) by mouth every 6 (six) hours as needed for severe pain. What changed:  how much to take   Vitamin D3 2000 units Tabs Take 2,000 Units by mouth daily.       Followup:  Follow-up Information    Ardis Hughs, MD In 6 weeks.   Specialty:  Urology Contact information: [place] [address] [phone]

## 2016-11-19 NOTE — Anesthesia Procedure Notes (Signed)
Procedure Name: LMA Insertion Performed by: Enrigue Catena E Pre-anesthesia Checklist: Patient identified, Emergency Drugs available, Suction available and Patient being monitored Patient Re-evaluated:Patient Re-evaluated prior to inductionOxygen Delivery Method: Circle system utilized Preoxygenation: Pre-oxygenation with 100% oxygen Intubation Type: IV induction Ventilation: Mask ventilation without difficulty LMA: LMA inserted LMA Size: 4.0 Tube type: Oral Number of attempts: 1 Airway Equipment and Method: Stylet and Oral airway Placement Confirmation: ETT inserted through vocal cords under direct vision,  positive ETCO2 and breath sounds checked- equal and bilateral Tube secured with: Tape Dental Injury: Teeth and Oropharynx as per pre-operative assessment

## 2016-11-19 NOTE — H&P (View-Only) (Signed)
I have reviewed the patient's CAT scan.  At this point, in speaking with the emergency room physician, admitting the patient to the hospital service for IV hydration and pain control  Is reasonable.  We'll plan to see her later this afternoon.  Likely making her nothing by mouth midnight with a repeat KUB to reassess her stone location.  She likely will pass this on her own with some time. If she can tolerate Flomax, be also a good medication for her.

## 2016-11-19 NOTE — Interval H&P Note (Signed)
History and Physical Interval Note:  11/19/2016 5:22 PM  Natasha [name]  has presented today for surgery, with the diagnosis of right obstruction  The various methods of treatment have been discussed with the patient and family. After consideration of risks, benefits and other options for treatment, the patient has consented to  Procedure(s): CYSTOSCOPY WITH RIGHT RETROGRADE PYELOGRAM/ URETEROSCOPY WITH LASER AND RIGHT URETERAL STENT PLACEMENT (Right) as a surgical intervention .  The patient's history has been reviewed, patient examined, no change in status, stable for surgery.  I have reviewed the patient's chart and labs.  Questions were answered to the patient's satisfaction.     Louis Meckel W

## 2016-11-19 NOTE — Anesthesia Postprocedure Evaluation (Signed)
Anesthesia Post Note  Patient: Natasha [name]  Procedure(s) Performed: Procedure(s) (LRB): CYSTOSCOPY WITH RIGHT RETROGRADE PYELOGRAM/ URETEROSCOPY WITH LASER AND RIGHT URETERAL STENT PLACEMENT (Right) HOLMIUM LASER APPLICATION (Right)     Patient location during evaluation: PACU Anesthesia Type: General Level of consciousness: awake and alert, patient cooperative and oriented Pain management: pain level controlled Vital Signs Assessment: post-procedure vital signs reviewed and stable Respiratory status: spontaneous breathing, nonlabored ventilation, respiratory function stable and patient connected to nasal cannula oxygen Cardiovascular status: blood pressure returned to baseline and stable Postop Assessment: no signs of nausea or vomiting Anesthetic complications: no    Last Vitals:  Vitals:   11/19/16 1926 11/19/16 1930  BP: (!) 133/58 (!) 117/58  Pulse: (!) 104 (!) 108  Resp: 13 12  Temp: 36.8 C     Last Pain:  Vitals:   11/19/16 1247  TempSrc:   PainSc: 6                  Jericho Alcorn,E. Wilene Pharo

## 2016-11-19 NOTE — Op Note (Signed)
Preoperative diagnosis: right ureteral calculus  Postoperative diagnosis: right ureteral calculus  Procedure:  1. Cystoscopy 2. right ureteroscopy and stone removal 3. Ureteroscopic laser lithotripsy 4. right 80F x 24 ureteral stent placement  5. right retrograde pyelography with interpretation  Surgeon: Ardis Hughs, MD  Anesthesia: General  Complications: None  Intraoperative findings: right retrograde pyelography demonstrated a filling defect within the right ureter consistent with the patient's known calculus without other abnormalities.  EBL: Minimal  Specimens: 1. right ureteral calculus  Disposition of specimens: [facility] for stone analysis  Indication: Natasha [name] is a 71 y.o.   patient with a right ureteral stone and associated right symptoms. After reviewing the management options for treatment, the patient elected to proceed with the above surgical procedure(s). We have discussed the potential benefits and risks of the procedure, side effects of the proposed treatment, the likelihood of the patient achieving the goals of the procedure, and any potential problems that might occur during the procedure or recuperation. Informed consent has been obtained.   Description of procedure:  The patient was taken to the operating room and general anesthesia was induced.  The patient was placed in the dorsal lithotomy position, prepped and draped in the usual sterile fashion, and preoperative antibiotics were administered. A preoperative time-out was performed.   Cystourethroscopy was performed.  The patient's urethra was examined and was normal. The bladder was then systematically examined in its entirety. There was no evidence for any bladder tumors, stones, or other mucosal pathology.    Attention then turned to the right ureteral orifice and a ureteral catheter was used to intubate the ureteral orifice.  Omnipaque contrast was injected through the  ureteral catheter and a retrograde pyelogram was performed with findings as dictated above.  A 0.38 sensor guidewire was then advanced up the right ureter into the renal pelvis under fluoroscopic guidance. The 6 Fr semirigid ureteroscope was then advanced into the ureter next to the guidewire and the calculus was identified.   The stone was then fragmented with the 365 micron holmium laser fiber on a setting of 0.6 and frequency of 6 Hz.   All stones were then removed from the ureter with an N-gage nitinol basket.  Reinspection of the ureter revealed no remaining visible stones or fragments.   The wire was then backloaded through the cystoscope and a ureteral stent was advance over the wire using Seldinger technique.  The stent was positioned appropriately under fluoroscopic and cystoscopic guidance.  The wire was then removed with an adequate stent curl noted in the renal pelvis as well as in the bladder.  The bladder was then emptied and the procedure ended.  The patient appeared to tolerate the procedure well and without complications.  The patient was able to be awakened and transferred to the recovery unit in satisfactory condition.   Disposition: The tether of the stent was left on and tucked inside the patient's vagina.  Instructions for removing the stent have been provided to the patient. The patient has been scheduled for followup in 6 weeks with a renal ultrasound.

## 2016-11-19 NOTE — Discharge Summary (Signed)
Pt transported by [facility] to [facility]. Report called to [place], RN on [phone]. Denies questions.

## 2016-11-19 NOTE — Progress Notes (Signed)
Patient ID: Natasha [name], female   DOB: 04-Jan-1946, 71 y.o.   MRN: [id number]    PROGRESS NOTE  Natasha [name]  JSH:[id number] DOB: 03/10/1946 DOA: 11/18/2016  PCP: [name], MD   Brief Narrative:  71 y.o. female with Crohn's disease, nephrolithiasis, hypothyroidism, pancreatitis, GERD, COPD, C KD, hypertension, presented with acute onset of right flank pain and gross hematuria.   Assessment & Plan: Nephrolithiasis - Right ureteral 5 mm stone noted - urology consulted, plan to transfer from [facility] to [facility] and take to OR this afternoon  - allow analgesia as needed  - appreciate urology team assistance   Acute kidney injury, chronic kidney disease stage IV, metabolic acidosis  - baseline Cr 3 - pt has been previously evaluated for AV fistula - pt is on lasix at baseline - has not been eating anything and Cr is up this AM - will hold Lasix and continue gentle hydration - monitor daily weights, strict I/O  Chronic diastolic congestive heart failure:  - Last echo showing an EF of 60-65% and grade 1 diastolic dysfunction. Well compensated. - hold lasix today until stone removed - daily weights, strict I/O  COPD - continue Trelegy Ellipta  Thrombocytopenia - mild, reactive - CBC in AM  Crohn's: - Continue Humira, Prednisone.   Hypothyroidism - continue synthroid   DVT prophylaxis: SCD's Code Status: Full  Family Communication: Patient at bedside  Disposition Plan: to be determined  Consultants:   Urology   Procedures:   None  Antimicrobials:   None  Subjective: Pt reports ongoing right flank pain.  Objective: Vitals:   11/19/16 0613 11/19/16 0844 11/19/16 0900 11/19/16 0958  BP: (!) 102/52  (!) 90/51 (!) 94/52  Pulse: 91  83 86  Resp: 16  18 16   Temp: 98.4 F (36.9 C)  98.2 F (36.8 C)   TempSrc: Oral  Oral   SpO2: 98% 97% 98% 98%  Weight:      Height:        Intake/Output Summary (Last 24 hours) at 11/19/16 1036 Last data  filed at 11/19/16 1000  Gross per 24 hour  Intake          2733.33 ml  Output              701 ml  Net          2032.33 ml   Filed Weights   11/18/16 0714 11/18/16 2310  Weight: 44.5 kg (98 lb) 45.5 kg (100 lb 6.4 oz)    Examination:  General exam: Appears calm and comfortable  Respiratory system: Clear to auscultation. Respiratory effort normal. Cardiovascular system: S1 & S2 heard, RRR. No JVD, murmurs, rubs, gallops or clicks.  Gastrointestinal system: Abdomen is nondistended, right flank area TTP Central nervous system: Alert and oriented. No focal neurological deficits. Extremities: Symmetric 5 x 5 power. Skin: No rashes, lesions or ulcers Psychiatry: Judgement and insight appear normal. Mood & affect appropriate.   Data Reviewed: I have personally reviewed following labs and imaging studies  CBC:  Recent Labs Lab 11/18/16 0747 11/19/16 0859  WBC 10.7* 8.7  HGB 11.6* 10.0*  HCT 37.4 32.6*  MCV 97.9 99.7  PLT 148* 850*   Basic Metabolic Panel:  Recent Labs Lab 11/18/16 0747 11/19/16 0859  NA 139 139  K 3.8 5.1  CL 120* 124*  CO2 11* 11*  GLUCOSE 82 75  BUN 35* 36*  CREATININE 3.58* 4.19*  CALCIUM 8.8* 8.1*   Liver Function Tests:  Recent  Labs Lab 11/18/16 0747  AST 16  ALT 11*  ALKPHOS 153*  BILITOT 0.6  PROT 6.5  ALBUMIN 3.6    Recent Labs Lab 11/18/16 0747  LIPASE 72*   BNP (last 3 results)  Recent Labs  07/30/16 1156  PROBNP 272   Urine analysis:    Component Value Date/Time   COLORURINE YELLOW 11/18/2016 0744   APPEARANCEUR CLEAR 11/18/2016 0744   LABSPEC 1.013 11/18/2016 0744   PHURINE 5.0 11/18/2016 0744   GLUCOSEU NEGATIVE 11/18/2016 0744   HGBUR LARGE (A) 11/18/2016 0744   BILIRUBINUR NEGATIVE 11/18/2016 0744   KETONESUR NEGATIVE 11/18/2016 0744   PROTEINUR 30 (A) 11/18/2016 0744   UROBILINOGEN 0.2 09/09/2014 0850   NITRITE NEGATIVE 11/18/2016 0744   LEUKOCYTESUR NEGATIVE 11/18/2016 0744   Radiology Studies: Abd  1 View (kub)  Result Date: 11/19/2016 CLINICAL DATA:  Right ureterolithiasis. EXAM: ABDOMEN - 1 VIEW COMPARISON:  CT 11/18/2016 FINDINGS: The right ureteral calculus is readily visible on radiography, unchanged in position from the recent CT, located in the distal pelvis a few cm proximal to the ureterovesical junction. Abdominal gas pattern is unremarkable. IMPRESSION: Distal right ureteral calculus Electronically Signed   By: Andreas Newport M.D.   On: 11/19/2016 05:49   Ct Renal Stone Study  Result Date: 11/18/2016 CLINICAL DATA:  Onset of hematuria this morning, history Crohn's disease with prior bowel resection, cholecystectomy, pancreatitis, stage IV chronic kidney disease, nephrolithiasis EXAM: CT ABDOMEN AND PELVIS WITHOUT CONTRAST TECHNIQUE: Multidetector CT imaging of the abdomen and pelvis was performed following the standard protocol without IV contrast. Sagittal and coronal MPR images reconstructed from axial data set. Oral contrast not administered for this indication. COMPARISON:  07/11/2016 FINDINGS: Lower chest: Lung bases hyperinflated but clear Hepatobiliary: Gallbladder surgically absent. Intrahepatic and extrahepatic biliary dilatation, CBD 11 mm previously 10 mm. No focal hepatic lesions. Pancreas: Normal appearance Spleen: Normal appearance Adrenals/Urinary Tract: Numerous BILATERAL nonobstructing renal calculi. In addition, RIGHT hydronephrosis secondary to a 5 mm distal RIGHT ureteral calculus image 59, new. Probable small BILATERAL renal cysts unchanged. Bladder unremarkable. Stomach/Bowel: Appendix surgically absent. Anastomotic staple line within transverse colon. Stomach decompressed. No definite focal bowel abnormalities within limitations of exam lacking contrast. Vascular/Lymphatic: Few scattered pelvic phleboliths. Atherosclerotic calcifications aorta without aneurysm. Reproductive: Uterus surgically absent.  Nonvisualization of ovaries Other: No free air or free fluid.  No  hernia. Musculoskeletal: Bones demineralized. IMPRESSION: New RIGHT hydronephrosis and hydroureter secondary to a 5 mm distal RIGHT ureteral calculus. Additional BILATERAL nonobstructing renal calculi and small renal cysts. Chronic biliary dilatation, potentially related to prior cholecystectomy, can correlate with LFTs. Aortic Atherosclerosis (ICD10-I70.0). Electronically Signed   By: Lavonia Dana M.D.   On: 11/18/2016 11:59    Scheduled Meds: . calcitRIOL  0.5 mcg Oral Daily  . fluticasone furoate-vilanterol  1 puff Inhalation Daily   And  . umeclidinium bromide  1 puff Inhalation Daily  . folic acid  1 mg Oral Daily  . furosemide  20 mg Oral QPM  . furosemide  40 mg Oral Daily  . levothyroxine  125 mcg Oral QAC breakfast  . potassium chloride SA  60 mEq Oral TID  . predniSONE  5 mg Oral Q breakfast  . sodium bicarbonate  650 mg Oral BID  . tamsulosin  0.4 mg Oral Daily   Continuous Infusions: . sodium chloride 100 mL/hr at 11/19/16 0510  . acetaminophen Stopped (11/19/16 0525)     LOS: 0 days   Time spent: 35 minutes   Triston Lisanti  Magick-Avi Kerschner, MD [facility] Pager [phone]  If 7PM-7AM, please contact night-coverage [email or web address] Password [facility] 11/19/2016, 10:36 AM

## 2016-11-19 NOTE — Anesthesia Preprocedure Evaluation (Signed)
Anesthesia Evaluation  Patient identified by MRN, date of birth, ID band Patient awake    Reviewed: Allergy & Precautions, NPO status , Patient's Chart, lab work & pertinent test results  History of Anesthesia Complications Negative for: history of anesthetic complications  Airway Mallampati: II  TM Distance: >3 FB Neck ROM: Full    Dental  (+) Chipped, Missing, Dental Advisory Given   Pulmonary COPD (last inhalers needed in April, daily steroids),  COPD inhaler, former smoker,    breath sounds clear to auscultation       Cardiovascular (-) hypertension(-) angina Rhythm:Regular Rate:Normal  3/18 ECHO: EF 60-65%, mod AI, mild MR   Neuro/Psych negative neurological ROS     GI/Hepatic Neg liver ROS, GERD  Controlled,  Endo/Other  Hypothyroidism   Renal/GU Renal InsufficiencyRenal disease (K+ 5.1, creat 4.19)     Musculoskeletal  (+) Arthritis ,   Abdominal   Peds  Hematology  (+) Blood dyscrasia (Hb 10.0), anemia ,   Anesthesia Other Findings   Reproductive/Obstetrics                             Anesthesia Physical Anesthesia Plan  ASA: III  Anesthesia Plan: General   Post-op Pain Management:    Induction: Intravenous  PONV Risk Score and Plan: 3 and Ondansetron, Dexamethasone and Midazolam  Airway Management Planned: LMA  Additional Equipment:   Intra-op Plan:   Post-operative Plan:   Informed Consent: I have reviewed the patients History and Physical, chart, labs and discussed the procedure including the risks, benefits and alternatives for the proposed anesthesia with the patient or authorized representative who has indicated his/her understanding and acceptance.   Dental advisory given  Plan Discussed with: CRNA and Surgeon  Anesthesia Plan Comments: (Plan routine monitors, GA- LMA OK)        Anesthesia Quick Evaluation

## 2016-11-19 NOTE — Progress Notes (Signed)
Patient voided and did not feel ready to go home. PCP was notified.

## 2016-11-20 ENCOUNTER — Encounter (HOSPITAL_COMMUNITY): Payer: Self-pay | Admitting: Urology

## 2016-11-20 DIAGNOSIS — N184 Chronic kidney disease, stage 4 (severe): Secondary | ICD-10-CM | POA: Diagnosis not present

## 2016-11-20 DIAGNOSIS — K5090 Crohn's disease, unspecified, without complications: Secondary | ICD-10-CM | POA: Diagnosis not present

## 2016-11-20 DIAGNOSIS — N179 Acute kidney failure, unspecified: Secondary | ICD-10-CM | POA: Diagnosis not present

## 2016-11-20 DIAGNOSIS — I5032 Chronic diastolic (congestive) heart failure: Secondary | ICD-10-CM | POA: Diagnosis not present

## 2016-11-20 DIAGNOSIS — I700 Atherosclerosis of aorta: Secondary | ICD-10-CM | POA: Diagnosis not present

## 2016-11-20 DIAGNOSIS — E440 Moderate protein-calorie malnutrition: Secondary | ICD-10-CM

## 2016-11-20 DIAGNOSIS — E872 Acidosis: Secondary | ICD-10-CM

## 2016-11-20 DIAGNOSIS — I130 Hypertensive heart and chronic kidney disease with heart failure and stage 1 through stage 4 chronic kidney disease, or unspecified chronic kidney disease: Secondary | ICD-10-CM | POA: Diagnosis not present

## 2016-11-20 DIAGNOSIS — N132 Hydronephrosis with renal and ureteral calculous obstruction: Secondary | ICD-10-CM | POA: Diagnosis not present

## 2016-11-20 LAB — BASIC METABOLIC PANEL
Anion gap: 4 — ABNORMAL LOW (ref 5–15)
Anion gap: 6 (ref 5–15)
BUN: 40 mg/dL — ABNORMAL HIGH (ref 6–20)
BUN: 38 mg/dL — ABNORMAL HIGH (ref 6–20)
CO2: 12 mmol/L — ABNORMAL LOW (ref 22–32)
CO2: 10 mmol/L — ABNORMAL LOW (ref 22–32)
Calcium: 8.5 mg/dL — ABNORMAL LOW (ref 8.9–10.3)
Calcium: 8.3 mg/dL — ABNORMAL LOW (ref 8.9–10.3)
Chloride: 123 mmol/L — ABNORMAL HIGH (ref 101–111)
Chloride: 121 mmol/L — ABNORMAL HIGH (ref 101–111)
Creatinine, Ser: 3.98 mg/dL — ABNORMAL HIGH (ref 0.44–1.00)
Creatinine, Ser: 4.2 mg/dL — ABNORMAL HIGH (ref 0.44–1.00)
GFR calc non Af Amer: 10 mL/min — ABNORMAL LOW (ref >60)
GFR calc non Af Amer: 10 mL/min — ABNORMAL LOW (ref >60)
GFR, EST AFRICAN AMERICAN: 12 mL/min — AB (ref >60)
GFR, EST AFRICAN AMERICAN: 11 mL/min — AB (ref >60)
GLUCOSE: 123 mg/dL — AB (ref 65–99)
GLUCOSE: 183 mg/dL — AB (ref 65–99)
Potassium: 6.5 mmol/L (ref 3.5–5.1)
Potassium: 5 mmol/L (ref 3.5–5.1)
SODIUM: 139 mmol/L (ref 135–145)
Sodium: 137 mmol/L (ref 135–145)

## 2016-11-20 LAB — CBC
HCT: 30.8 % — ABNORMAL LOW (ref 36.0–46.0)
Hemoglobin: 9.7 g/dL — ABNORMAL LOW (ref 12.0–15.0)
MCH: 30.8 pg (ref 26.0–34.0)
MCHC: 31.5 g/dL (ref 30.0–36.0)
MCV: 97.8 fL (ref 78.0–100.0)
Platelets: 111 10*3/uL — ABNORMAL LOW (ref 150–400)
RBC: 3.15 MIL/uL — ABNORMAL LOW (ref 3.87–5.11)
RDW: 16.2 % — AB (ref 11.5–15.5)
WBC: 7.2 10*3/uL (ref 4.0–10.5)

## 2016-11-20 MED ORDER — SODIUM CHLORIDE 0.9 % IV SOLN
INTRAVENOUS | Status: DC
  Administered 2016-11-20 – 2016-11-21 (×2): via INTRAVENOUS

## 2016-11-20 MED ORDER — DEXTROSE 50 % IV SOLN
1.0000 | Freq: Once | INTRAVENOUS | Status: AC
  Administered 2016-11-20: 50 mL via INTRAVENOUS
  Filled 2016-11-20: qty 50

## 2016-11-20 MED ORDER — SODIUM POLYSTYRENE SULFONATE 15 GM/60ML PO SUSP
30.0000 g | Freq: Once | ORAL | Status: AC
  Administered 2016-11-20: 30 g via ORAL
  Filled 2016-11-20: qty 120

## 2016-11-20 MED ORDER — SODIUM BICARBONATE 8.4 % IV SOLN
Freq: Once | INTRAVENOUS | Status: AC
  Administered 2016-11-20: 11:00:00 via INTRAVENOUS
  Filled 2016-11-20: qty 100

## 2016-11-20 MED ORDER — ALBUTEROL SULFATE (2.5 MG/3ML) 0.083% IN NEBU
10.0000 mg | INHALATION_SOLUTION | Freq: Once | RESPIRATORY_TRACT | Status: AC
  Administered 2016-11-20: 10 mg via RESPIRATORY_TRACT
  Filled 2016-11-20: qty 12

## 2016-11-20 MED ORDER — ALUM & MAG HYDROXIDE-SIMETH 200-200-20 MG/5ML PO SUSP
30.0000 mL | ORAL | Status: DC | PRN
  Administered 2016-11-20: 30 mL via ORAL
  Filled 2016-11-20: qty 30

## 2016-11-20 MED ORDER — SODIUM BICARBONATE 650 MG PO TABS
1300.0000 mg | ORAL_TABLET | Freq: Two times a day (BID) | ORAL | Status: DC
  Administered 2016-11-20 – 2016-11-21 (×2): 1300 mg via ORAL
  Filled 2016-11-20 (×2): qty 2

## 2016-11-20 MED ORDER — SODIUM CHLORIDE 0.9 % IV BOLUS (SEPSIS): 500.0000 mL | Freq: Once | INTRAVENOUS | Status: DC

## 2016-11-20 MED ORDER — INSULIN ASPART 100 UNIT/ML IV SOLN
10.0000 [IU] | Freq: Once | INTRAVENOUS | Status: AC
  Administered 2016-11-20: 10 [IU] via INTRAVENOUS

## 2016-11-20 NOTE — Progress Notes (Signed)
PROGRESS NOTE    Natasha [name]  YVO:[id number] DOB: May 05, 1946 DOA: 11/18/2016 PCP: [name], MD   Brief Narrative: Natasha [name] is a 71 y.o. female with a history of Crohn disease, hypothyroidism, GERD, COPD, CKD stage IV, hypertension. Patient presented with gross hematuria is found to have nephrolithiasis. She underwent lithotripsy on 11/19/2016.   Assessment & Plan:   Active Problems:   Crohn's disease without complication (HCC)   CKD (chronic kidney disease) stage 4, GFR 15-29 ml/min (HCC)   Emphysema/COPD (HCC)   Nephrolithiasis   AKI (acute kidney injury) ([place])   Chronic diastolic CHF (congestive heart failure) ([place])   Nephrolithiasis She is status post lithotripsy on 11/19/2016. -Continue Flomax  Acute kidney injury on CKD stage IV Metabolic acidosis This appears to be chronic however CO2 is very low. Discussed with Dr. Jonnie Finner, nephrology, who recommended increasing bicarbonate to 1300 mg twice daily. -Bicarbonate 30 minutes twice daily -Normal saline at 75 mL per hour -Continue calcitriol  Hyperkalemia Secondary to kidney disease -EKG -Kayexalate, insulin with D50 and albuterol  Crohn disease Stable -Continue steroids  Chronic diastolic heart failure Stable. Patient is more on the dry side.  Hypothyroidism -Continue Synthroid  COPD -Continue breo ellipta/incruse ellipta  Thrombocytopenia Chronic.   DVT prophylaxis: SCDs secondary to thrombocytopenia Code Status: Full code Family Communication: None at bedside Disposition Plan: Discharge in 24-48 hours   Consultants:   Urology  Nephrology (telephone)  Procedures:   Lithotripsy (11/19/2016)  Antimicrobials:  Cefazolin (11/19/16)    Subjective: Patient reports no chest pain and dyspnea. No abdominal pain.  Objective: Vitals:   11/20/16 0038 11/20/16 0648 11/20/16 1210 11/20/16 1347  BP: (!) 100/56 (!) 113/54  (!) 109/54  Pulse: 81 92  (!) 113  Resp: 13 16   18   Temp: 98 F (36.7 C) 98 F (36.7 C)  98.6 F (37 C)  TempSrc: Oral Oral  Axillary  SpO2:  91% 100% 100%  Weight:      Height:        Intake/Output Summary (Last 24 hours) at 11/20/16 1354 Last data filed at 11/20/16 0939  Gross per 24 hour  Intake          3475.84 ml  Output             1250 ml  Net          2225.84 ml   Filed Weights   11/18/16 0714 11/18/16 2310 11/19/16 1219  Weight: 44.5 kg (98 lb) 45.5 kg (100 lb 6.4 oz) 46 kg (101 lb 6.6 oz)    Examination:  General exam: Appears calm and comfortable Respiratory system: Clear to auscultation. Respiratory effort normal. Cardiovascular system: S1 & S2 heard, RRR. No murmurs. Gastrointestinal system: Abdomen is nondistended, soft and nontender. Normal bowel sounds heard. Central nervous system: Alert and oriented. No focal neurological deficits. Extremities: No edema. No calf tenderness Skin: No cyanosis. No rashes Psychiatry: Judgement and insight appear normal. Mood & affect appropriate.     Data Reviewed: I have personally reviewed following labs and imaging studies  CBC:  Recent Labs Lab 11/18/16 0747 11/19/16 0859 11/20/16 0513  WBC 10.7* 8.7 7.2  HGB 11.6* 10.0* 9.7*  HCT 37.4 32.6* 30.8*  MCV 97.9 99.7 97.8  PLT 148* 129* 863*   Basic Metabolic Panel:  Recent Labs Lab 11/18/16 0747 11/19/16 0859 11/20/16 0845  NA 139 139 139  K 3.8 5.1 6.5*  CL 120* 124* 123*  CO2 11* 11* 12*  GLUCOSE  82 75 123*  BUN 35* 36* 40*  CREATININE 3.58* 4.19* 3.98*  CALCIUM 8.8* 8.1* 8.5*   GFR: Estimated Creatinine Clearance: 9.6 mL/min (A) (by C-G formula based on SCr of 3.98 mg/dL (H)). Liver Function Tests:  Recent Labs Lab 11/18/16 0747  AST 16  ALT 11*  ALKPHOS 153*  BILITOT 0.6  PROT 6.5  ALBUMIN 3.6    Recent Labs Lab 11/18/16 0747  LIPASE 72*   No results for input(s): AMMONIA in the last 168 hours. Coagulation Profile: No results for input(s): INR, PROTIME in the last 168  hours. Cardiac Enzymes: No results for input(s): CKTOTAL, CKMB, CKMBINDEX, TROPONINI in the last 168 hours. BNP (last 3 results)  Recent Labs  07/30/16 1156  PROBNP 272   HbA1C: No results for input(s): HGBA1C in the last 72 hours. CBG: No results for input(s): GLUCAP in the last 168 hours. Lipid Profile: No results for input(s): CHOL, HDL, LDLCALC, TRIG, CHOLHDL, LDLDIRECT in the last 72 hours. Thyroid Function Tests: No results for input(s): TSH, T4TOTAL, FREET4, T3FREE, THYROIDAB in the last 72 hours. Anemia Panel: No results for input(s): VITAMINB12, FOLATE, FERRITIN, TIBC, IRON, RETICCTPCT in the last 72 hours. Sepsis Labs: No results for input(s): PROCALCITON, LATICACIDVEN in the last 168 hours.  Recent Results (from the past 240 hour(s))  Urine culture     Status: Abnormal   Collection Time: 11/18/16  7:44 AM  Result Value Ref Range Status   Specimen Description URINE, RANDOM  Final   Special Requests NONE  Final   Culture MULTIPLE SPECIES PRESENT, SUGGEST RECOLLECTION (A)  Final   Report Status 11/19/2016 FINAL  Final  Surgical pcr screen     Status: None   Collection Time: 11/19/16  1:13 PM  Result Value Ref Range Status   MRSA, PCR NEGATIVE NEGATIVE Final   Staphylococcus aureus NEGATIVE NEGATIVE Final    Comment:        The Xpert SA Assay (FDA approved for NASAL specimens in patients over 96 years of age), is one component of a comprehensive surveillance program.  Test performance has been validated by [facility] for patients greater than or equal to 75 year old. It is not intended to diagnose infection nor to guide or monitor treatment.          Radiology Studies: Abd 1 View (kub)  Result Date: 11/19/2016 CLINICAL DATA:  Right ureterolithiasis. EXAM: ABDOMEN - 1 VIEW COMPARISON:  CT 11/18/2016 FINDINGS: The right ureteral calculus is readily visible on radiography, unchanged in position from the recent CT, located in the distal pelvis a few cm  proximal to the ureterovesical junction. Abdominal gas pattern is unremarkable. IMPRESSION: Distal right ureteral calculus Electronically Signed   By: Andreas Newport M.D.   On: 11/19/2016 05:49   Dg Retrograde-urethrogram  Result Date: 11/19/2016 Fluoroscopy was utilized by the requesting physician.  No radiographic interpretation.        Scheduled Meds: . calcitRIOL  0.5 mcg Oral Daily  . fluticasone furoate-vilanterol  1 puff Inhalation Daily   And  . umeclidinium bromide  1 puff Inhalation Daily  . folic acid  1 mg Oral Daily  . levothyroxine  125 mcg Oral QAC breakfast  . predniSONE  5 mg Oral Q breakfast  . sodium bicarbonate  650 mg Oral BID  . tamsulosin  0.4 mg Oral Daily   Continuous Infusions: .  sodium bicarbonate  infusion 1000 mL 100 mL/hr at 11/20/16 1121     LOS: 0 days  Cordelia Poche, MD [facility] 11/20/2016, 1:54 PM Pager: [phone]  If 7PM-7AM, please contact night-coverage [email or web address] Password [email or web address] 11/20/2016, 1:54 PM

## 2016-11-20 NOTE — Progress Notes (Signed)
Patient ambulated in the hallway. Patient stated that she felt pretty good.

## 2016-11-20 NOTE — Progress Notes (Signed)
CRITICAL VALUE ALERT  Critical Value:  K+6.5  Date & Time Notied:  11/20/16 1013  Provider Notified: Dr Teryl Lucy  Orders Received/Actions taken: notified MD

## 2016-11-21 DIAGNOSIS — N179 Acute kidney failure, unspecified: Secondary | ICD-10-CM | POA: Diagnosis not present

## 2016-11-21 DIAGNOSIS — K5090 Crohn's disease, unspecified, without complications: Secondary | ICD-10-CM | POA: Diagnosis not present

## 2016-11-21 DIAGNOSIS — N184 Chronic kidney disease, stage 4 (severe): Secondary | ICD-10-CM | POA: Diagnosis not present

## 2016-11-21 DIAGNOSIS — I5032 Chronic diastolic (congestive) heart failure: Secondary | ICD-10-CM | POA: Diagnosis not present

## 2016-11-21 DIAGNOSIS — N289 Disorder of kidney and ureter, unspecified: Secondary | ICD-10-CM

## 2016-11-21 LAB — BASIC METABOLIC PANEL
Anion gap: 6 (ref 5–15)
BUN: 38 mg/dL — AB (ref 6–20)
CALCIUM: 8.4 mg/dL — AB (ref 8.9–10.3)
CO2: 16 mmol/L — AB (ref 22–32)
Chloride: 121 mmol/L — ABNORMAL HIGH (ref 101–111)
Creatinine, Ser: 3.9 mg/dL — ABNORMAL HIGH (ref 0.44–1.00)
GFR calc Af Amer: 12 mL/min — ABNORMAL LOW (ref >60)
GFR, EST NON AFRICAN AMERICAN: 11 mL/min — AB (ref >60)
GLUCOSE: 106 mg/dL — AB (ref 65–99)
Potassium: 3.5 mmol/L (ref 3.5–5.1)
Sodium: 143 mmol/L (ref 135–145)

## 2016-11-21 MED ORDER — SODIUM BICARBONATE 650 MG PO TABS: 1300.0000 mg | ORAL_TABLET | Freq: Two times a day (BID) | ORAL | 0 refills | Status: DC

## 2016-11-21 NOTE — Care Management Obs Status (Signed)
[facility] NOTIFICATION   Patient Details  Name: Natasha [name] MRN: [id number] Date of Birth: Mar 30, 1946   Medicare Observation Status Notification Given:  Yes    Dessa Phi, RN 11/21/2016, 12:40 PM

## 2016-11-21 NOTE — Discharge Instructions (Signed)
Natasha [name] were admitted for a kidney stone managed in the OR. He also found to have somewhat high carbonate was suggested. Before you discharge her kidney function improved slightly. Please follow-up with your nephrologist to get repeat lab work and for further adjustment of the medication. Please follow-up with the urologist for your kidney stone.  DISCHARGE INSTRUCTIONS FOR KIDNEY STONE/URETERAL STENT   MEDICATIONS:  1.  Resume all your other meds from home - except do not take any extra narcotic pain meds that you may have at home.  2. Tramadol is for moderate/severe pain, otherwise taking up to 1000 mg every 6 hours of plainTylenol will help treat your pain.   4. Take Keflex one hour prior to removal of your stent.   ACTIVITY:  1. No strenuous activity x 1week  2. No driving while on narcotic pain medications  3. Drink plenty of water  4. Continue to walk at home - you can still get blood clots when you are at home, so keep active, but don't over do it.  5. May return to work/school tomorrow or when you feel ready   BATHING:  1. You can shower and we recommend daily showers  2. You have a string coming from your urethra: The stent string is attached to your ureteral stent. Do not pull on this.   SIGNS/SYMPTOMS TO CALL:  Please call us if you have a fever greater than 101.5, uncontrolled nausea/vomiting, uncontrolled pain, dizziness, unable to urinate, bloody urine, chest pain, shortness of breath, leg swelling, leg pain, redness around wound, drainage from wound, or any other concerns or questions.   You can reach [place] at [phone].   FOLLOW-UP:  1. You have an appointment in 6 weeks with a ultrasound of your kidneys prior.   2. You have a string attached to your stent, you may remove it on July 2nd. To do this, pull the strings until the stents are completely removed. You may feel an odd sensation in your back. 3. Please make an appointment with Dr. Mercy Moore at  [facility] to follow-up on your kidney function within the next few days.

## 2016-11-21 NOTE — Discharge Summary (Signed)
Physician Discharge Summary  ARENA LINDAHL [name]:[id number] DOB: [birth year] DOA: 11/18/2016  PCP: [name], MD  Admit date: 11/18/2016 Discharge date: 11/21/2016  Admitted From: Home Disposition: Home  Recommendations for Outpatient Follow-up:  1. Follow up with PCP in 1 week 2. Follow up with nephrologist in 3 days for repeat labs and medication adjustments for renal failure 3. Please obtain BMP in one week  Home Health: None Equipment/Devices: None  Discharge Condition: Stable CODE STATUS: Full code Diet recommendation: Heart healthy/renal diet   Brief/Interim Summary:  Admission HPI written by Waldemar Dickens, MD   Chief Complaint: flank and abdominal pain  HPI: Natasha [name] is a 71 y.o. female with medical history significant of Crohn's disease, nephrolithiasis, hypothyroidism, pancreatitis, GERD, COPD, C KD, hypertension.   Acute onset right flank pain. Associated with gross hematuria. Patient states that she was in her normal state of health prior to onset of symptoms which started on 11/16/2016 with hematuria. The following day patient continued to have hematuria but then developed right sided flank pain. When the pain becomes severe patient becomes nauseous but denies any emesis. Of note patient has a history of Crohn's disease and states that this pain is different than her typical Crohn's flare. Denies fevers, dysuria, frequency, neck, headache, just become shortness breath, palpitations. Pain is somewhat of a dull achy pain but becomes sharp in nature from time to time.  Initially intermittent but now is constant. States that she has previous been told that she has kidney stones but has never had pain or hematuria while passing them. Pain seems to be traveling from her flank region down towards her suprapubic region.   ED Course: Objective findings outlined below. Urology consult to by EDP.    Hospital course:  Nephrolithiasis Patient underwent  stone removal, lithotripsy and stent placement on 11/19/2016. Outpatient follow-up with nephrology.  Acute kidney injury on CKD stage IV Metabolic acidosis Acute kidney injury likely secondary to hypotension. Improved with IV fluids. Patient had associated worsening metabolic acidosis. She is already on sodium bicarbonate tablets which were increased to 1300 mg twice daily. Patient will need to follow-up with her nephrologist as an outpatient.   Hyperkalemia Secondary to kidney disease. EKG unremarkable for peaked T waves. Patient given insulin with D50, albuterol, and Kayexalate. Potassium improved significantly. Patient will need repeat lab work to recheck potassium.  Crohn disease Stable. Continued steroids.  Chronic diastolic heart failure Stable. No exacerbation  Hypothyroidism Continued Synthroid  COPD Continued inhaler.  Thrombocytopenia Chronic. Stable.  Discharge Diagnoses:  Active Problems:   Crohn's disease without complication (HCC)   Metabolic acidosis   CKD (chronic kidney disease) stage 4, GFR 15-29 ml/min (HCC)   Emphysema/COPD (HCC)   Nephrolithiasis   AKI (acute kidney injury) ([place])   Chronic diastolic CHF (congestive heart failure) [facility])    Discharge Instructions  Discharge Instructions    Call MD for:  difficulty breathing, headache or visual disturbances    Complete by:  As directed    Call MD for:  persistant dizziness or light-headedness    Complete by:  As directed    Call MD for:  persistant nausea and vomiting    Complete by:  As directed    Call MD for:  severe uncontrolled pain    Complete by:  As directed    Call MD for:  temperature >100.4    Complete by:  As directed    Diet - low sodium heart healthy    Complete by:  As directed    Increase activity slowly    Complete by:  As directed      Allergies as of 11/21/2016      Reactions   Mercaptopurine Other (See Comments)   Caused pancreatitis   Remicade [infliximab] Other  (See Comments)   "joint pain"      Medication List    TAKE these medications   acetaminophen 500 MG tablet Commonly known as:  TYLENOL Take 1,000 mg by mouth every 6 (six) hours as needed for mild pain.   albuterol 108 (90 Base) MCG/ACT inhaler Commonly known as:  PROVENTIL HFA;VENTOLIN HFA Inhale 2 puffs into the lungs every 6 (six) hours as needed for wheezing or shortness of breath.   alendronate 70 MG tablet Commonly known as:  FOSAMAX Take 70 mg by mouth once a week. Mondays   calcitRIOL 0.5 MCG capsule Commonly known as:  ROCALTROL Take 0.5 mcg by mouth daily.   cyanocobalamin 1000 MCG/ML injection Commonly known as:  (VITAMIN B-12) Inject 1,000 mcg into the muscle See admin instructions. Every 60 days   Fluticasone-Umeclidin-Vilant 100-62.5-25 MCG/INH Aepb Commonly known as:  TRELEGY ELLIPTA Inhale 1 puff into the lungs daily.   folic acid 1 MG tablet Commonly known as:  FOLVITE Take 1 mg by mouth daily.   furosemide 40 MG tablet Commonly known as:  LASIX Take 0.5 tablets (20 mg total) by mouth 3 (three) times daily as needed. What changed:  how much to take  when to take this  additional instructions Notes to patient:  Hold this medication until you visit your nephrologist.   HUMIRA 40 MG/0.8ML Pskt Generic drug:  Adalimumab Inject 40 mg into the skin every 14 (fourteen) days.   levothyroxine 125 MCG tablet Commonly known as:  SYNTHROID, LEVOTHROID Take 125 mcg by mouth every morning.   nitroGLYCERIN 0.4 MG SL tablet Commonly known as:  NITROSTAT Place 1 tablet (0.4 mg total) under the tongue every 5 (five) minutes as needed for chest pain.   potassium chloride SA 20 MEQ tablet Commonly known as:  K-DUR,KLOR-CON Take 60 mEq by mouth 3 (three) times daily.   predniSONE 5 MG tablet Commonly known as:  DELTASONE Take 5 mg by mouth daily with breakfast.   promethazine 25 MG tablet Commonly known as:  PHENERGAN Take 25 mg by mouth every 6 (six)  hours as needed for nausea or vomiting.   sodium bicarbonate 650 MG tablet Take 2 tablets (1,300 mg total) by mouth 2 (two) times daily. What changed:  how much to take   traMADol 50 MG tablet Commonly known as:  ULTRAM Take 1-2 tablets (50-100 mg total) by mouth every 6 (six) hours as needed for severe pain. What changed:  how much to take   Vitamin D3 2000 units Tabs Take 2,000 Units by mouth daily.     ASK your doctor about these medications   cephALEXin 500 MG capsule Commonly known as:  KEFLEX Take 1 capsule (500 mg total) by mouth once. Take one hour prior to stent removal Ask about: Should I take this medication?      Follow-up Information    Ardis Hughs, MD Follow up in 6 week(s).   Specialty:  Urology Contact information: [place] [address] [phone]        Fleet Contras, MD. Schedule an appointment as soon as possible for a visit in 3 day(s).   Specialty:  Nephrology Why:  Labs/office visit Contact information: [place]  [place] [address] [phone]        [name], MD. Schedule an appointment as soon as possible for a visit in 1 week(s).   Specialty:  Internal Medicine Why:  Hospital follow-up Contact information: [address] [place] [address] [phone]          Allergies  Allergen Reactions  . Mercaptopurine Other (See Comments)    Caused pancreatitis  . Remicade [Infliximab] Other (See Comments)    "joint pain"    Consultations:  Urology  Nephrology (telephone)   Procedures/Studies: Abd 1 View (kub)  Result Date: 11/19/2016 CLINICAL DATA:  Right ureterolithiasis. EXAM: ABDOMEN - 1 VIEW COMPARISON:  CT 11/18/2016 FINDINGS: The right ureteral calculus is readily visible on radiography, unchanged in position from the recent CT, located in the distal pelvis a few cm proximal to the ureterovesical junction. Abdominal gas pattern is unremarkable. IMPRESSION:  Distal right ureteral calculus Electronically Signed   By: Andreas Newport M.D.   On: 11/19/2016 05:49   Dg Retrograde-urethrogram  Result Date: 11/19/2016 Fluoroscopy was utilized by the requesting physician.  No radiographic interpretation.   Ct Renal Stone Study  Result Date: 11/18/2016 CLINICAL DATA:  Onset of hematuria this morning, history Crohn's disease with prior bowel resection, cholecystectomy, pancreatitis, stage IV chronic kidney disease, nephrolithiasis EXAM: CT ABDOMEN AND PELVIS WITHOUT CONTRAST TECHNIQUE: Multidetector CT imaging of the abdomen and pelvis was performed following the standard protocol without IV contrast. Sagittal and coronal MPR images reconstructed from axial data set. Oral contrast not administered for this indication. COMPARISON:  07/11/2016 FINDINGS: Lower chest: Lung bases hyperinflated but clear Hepatobiliary: Gallbladder surgically absent. Intrahepatic and extrahepatic biliary dilatation, CBD 11 mm previously 10 mm. No focal hepatic lesions. Pancreas: Normal appearance Spleen: Normal appearance Adrenals/Urinary Tract: Numerous BILATERAL nonobstructing renal calculi. In addition, RIGHT hydronephrosis secondary to a 5 mm distal RIGHT ureteral calculus image 59, new. Probable small BILATERAL renal cysts unchanged. Bladder unremarkable. Stomach/Bowel: Appendix surgically absent. Anastomotic staple line within transverse colon. Stomach decompressed. No definite focal bowel abnormalities within limitations of exam lacking contrast. Vascular/Lymphatic: Few scattered pelvic phleboliths. Atherosclerotic calcifications aorta without aneurysm. Reproductive: Uterus surgically absent.  Nonvisualization of ovaries Other: No free air or free fluid.  No hernia. Musculoskeletal: Bones demineralized. IMPRESSION: New RIGHT hydronephrosis and hydroureter secondary to a 5 mm distal RIGHT ureteral calculus. Additional BILATERAL nonobstructing renal calculi and small renal cysts. Chronic  biliary dilatation, potentially related to prior cholecystectomy, can correlate with LFTs. Aortic Atherosclerosis (ICD10-I70.0). Electronically Signed   By: Lavonia Dana M.D.   On: 11/18/2016 11:59      Subjective: Patient reports feeling great today. No chest pain or dyspnea. She is urinating well.  Discharge Exam: Vitals:   11/20/16 2048 11/21/16 0453  BP: 123/66 100/60  Pulse: (!) 106 98  Resp: 16 15  Temp: 99.5 F (37.5 C) 97.4 F (36.3 C)   Vitals:   11/20/16 2048 11/21/16 0453 11/21/16 0824 11/21/16 0827  BP: 123/66 100/60    Pulse: (!) 106 98    Resp: 16 15    Temp: 99.5 F (37.5 C) 97.4 F (36.3 C)    TempSrc: Oral Oral    SpO2: 100% 99% 99% 99%  Weight:  46.5 kg (102 lb 8.2 oz)    Height:        General: Pt is alert, awake, not in acute distress Cardiovascular: RRR, S1/S2 +, no rubs, no gallops Respiratory: CTA bilaterally, no wheezing, no rhonchi Abdominal: Soft, NT, ND,  bowel sounds + Extremities: no edema, no cyanosis    The results of significant diagnostics from this hospitalization (including imaging, microbiology, ancillary and laboratory) are listed below for reference.     Microbiology: Recent Results (from the past 240 hour(s))  Urine culture     Status: Abnormal   Collection Time: 11/18/16  7:44 AM  Result Value Ref Range Status   Specimen Description URINE, RANDOM  Final   Special Requests NONE  Final   Culture MULTIPLE SPECIES PRESENT, SUGGEST RECOLLECTION (A)  Final   Report Status 11/19/2016 FINAL  Final  Surgical pcr screen     Status: None   Collection Time: 11/19/16  1:13 PM  Result Value Ref Range Status   MRSA, PCR NEGATIVE NEGATIVE Final   Staphylococcus aureus NEGATIVE NEGATIVE Final    Comment:        The Xpert SA Assay (FDA approved for NASAL specimens in patients over 46 years of age), is one component of a comprehensive surveillance program.  Test performance has been validated by [facility] for patients greater than  or equal to 74 year old. It is not intended to diagnose infection nor to guide or monitor treatment.      Labs: BNP (last 3 results) No results for input(s): BNP in the last 8760 hours. Basic Metabolic Panel:  Recent Labs Lab 11/18/16 0747 11/19/16 0859 11/20/16 0845 11/20/16 1422 11/21/16 1032  NA 139 139 139 137 143  K 3.8 5.1 6.5* 5.0 3.5  CL 120* 124* 123* 121* 121*  CO2 11* 11* 12* 10* 16*  GLUCOSE 82 75 123* 183* 106*  BUN 35* 36* 40* 38* 38*  CREATININE 3.58* 4.19* 3.98* 4.20* 3.90*  CALCIUM 8.8* 8.1* 8.5* 8.3* 8.4*   Liver Function Tests:  Recent Labs Lab 11/18/16 0747  AST 16  ALT 11*  ALKPHOS 153*  BILITOT 0.6  PROT 6.5  ALBUMIN 3.6    Recent Labs Lab 11/18/16 0747  LIPASE 72*   No results for input(s): AMMONIA in the last 168 hours. CBC:  Recent Labs Lab 11/18/16 0747 11/19/16 0859 11/20/16 0513  WBC 10.7* 8.7 7.2  HGB 11.6* 10.0* 9.7*  HCT 37.4 32.6* 30.8*  MCV 97.9 99.7 97.8  PLT 148* 129* 111*   Cardiac Enzymes: No results for input(s): CKTOTAL, CKMB, CKMBINDEX, TROPONINI in the last 168 hours. BNP: Invalid input(s): POCBNP CBG: No results for input(s): GLUCAP in the last 168 hours. D-Dimer No results for input(s): DDIMER in the last 72 hours. Hgb A1c No results for input(s): HGBA1C in the last 72 hours. Lipid Profile No results for input(s): CHOL, HDL, LDLCALC, TRIG, CHOLHDL, LDLDIRECT in the last 72 hours. Thyroid function studies No results for input(s): TSH, T4TOTAL, T3FREE, THYROIDAB in the last 72 hours.  Invalid input(s): FREET3 Anemia work up No results for input(s): VITAMINB12, FOLATE, FERRITIN, TIBC, IRON, RETICCTPCT in the last 72 hours. Urinalysis    Component Value Date/Time   COLORURINE YELLOW 11/18/2016 0744   APPEARANCEUR CLEAR 11/18/2016 0744   LABSPEC 1.013 11/18/2016 0744   PHURINE 5.0 11/18/2016 0744   GLUCOSEU NEGATIVE 11/18/2016 0744   HGBUR LARGE (A) 11/18/2016 0744   BILIRUBINUR NEGATIVE  11/18/2016 0744   KETONESUR NEGATIVE 11/18/2016 0744   PROTEINUR 30 (A) 11/18/2016 0744   UROBILINOGEN 0.2 09/09/2014 0850   NITRITE NEGATIVE 11/18/2016 0744   LEUKOCYTESUR NEGATIVE 11/18/2016 0744   Sepsis Labs Invalid input(s): PROCALCITONIN,  WBC,  LACTICIDVEN Microbiology Recent Results (from the past 240 hour(s))  Urine culture  Status: Abnormal   Collection Time: 11/18/16  7:44 AM  Result Value Ref Range Status   Specimen Description URINE, RANDOM  Final   Special Requests NONE  Final   Culture MULTIPLE SPECIES PRESENT, SUGGEST RECOLLECTION (A)  Final   Report Status 11/19/2016 FINAL  Final  Surgical pcr screen     Status: None   Collection Time: 11/19/16  1:13 PM  Result Value Ref Range Status   MRSA, PCR NEGATIVE NEGATIVE Final   Staphylococcus aureus NEGATIVE NEGATIVE Final    Comment:        The Xpert SA Assay (FDA approved for NASAL specimens in patients over 54 years of age), is one component of a comprehensive surveillance program.  Test performance has been validated by [facility] for patients greater than or equal to 62 year old. It is not intended to diagnose infection nor to guide or monitor treatment.      Time coordinating discharge: Over 30 minutes  SIGNED:   Cordelia Poche, MD [facility] 11/21/2016, 12:07 PM Pager [phone]  If 7PM-7AM, please contact night-coverage [email or web address] Password [email or web address]

## 2016-12-16 ENCOUNTER — Encounter: Payer: Self-pay | Admitting: Vascular Surgery

## 2017-01-02 ENCOUNTER — Ambulatory Visit (INDEPENDENT_AMBULATORY_CARE_PROVIDER_SITE_OTHER)
Admission: RE | Admit: 2017-01-02 | Discharge: 2017-01-02 | Disposition: A | Discharge status: Home / Self Care | Payer: Medicare Other | Source: Ambulatory Visit | Attending: Vascular Surgery | Admitting: Vascular Surgery

## 2017-01-02 ENCOUNTER — Ambulatory Visit (INDEPENDENT_AMBULATORY_CARE_PROVIDER_SITE_OTHER): Payer: Medicare Other | Admitting: Vascular Surgery

## 2017-01-02 ENCOUNTER — Encounter: Payer: Self-pay | Admitting: Vascular Surgery

## 2017-01-02 ENCOUNTER — Ambulatory Visit (HOSPITAL_COMMUNITY)
Admission: RE | Admit: 2017-01-02 | Discharge: 2017-01-02 | Disposition: A | Discharge status: Home / Self Care | Payer: Medicare Other | Source: Ambulatory Visit | Attending: Vascular Surgery | Admitting: Vascular Surgery

## 2017-01-02 VITALS — BP 100/76 | HR 85 | Temp 97.9°F | Resp 16 | Ht 64.0 in | Wt 94.0 lb

## 2017-01-02 DIAGNOSIS — Z01818 Encounter for other preprocedural examination: Secondary | ICD-10-CM | POA: Diagnosis present

## 2017-01-02 DIAGNOSIS — N184 Chronic kidney disease, stage 4 (severe): Secondary | ICD-10-CM | POA: Diagnosis not present

## 2017-01-02 DIAGNOSIS — N185 Chronic kidney disease, stage 5: Secondary | ICD-10-CM

## 2017-01-02 NOTE — Progress Notes (Signed)
Requested by:  [name], MD [address] [place], [place] [address]  Reason for consultation: New access   History of Present Illness   Natasha [name] is a 71 y.o. (August 14, 1945) female who presents for evaluation for permanent access.  The patient is right hand dominant.  The patient has not had previous access procedures.  Previous central venous cannulation procedures include: none.  The patient has never had a PPM placed.   The patient is having active symptoms: nausea, dermatitis, and foggy thinking.  Past Medical History:  Diagnosis Date  . Anal fistula   . Anemia in chronic kidney disease (CKD)   . Aortic insufficiency    Echo 3/18: mod conc LVH, EF 60-65, no RWMA, Gr 1 DD, mod AI, mild MR, normal RVSF, Trivial TR  . Arthritis   . Borderline hypertension   . Bulging disc    L3-L4  . Chronic diarrhea    due to crohn's  . CKD (chronic kidney disease), stage IV ([place])    NEPHROLOGIST--  DR MATTINGLY  . Crohn's disease ([place])    chronic ileitis  . Emphysema/COPD ([place])   . GERD (gastroesophageal reflux disease)   . History of glaucoma   . History of pancreatitis    2008--  mercaptopurine  . History of small bowel obstruction    12-03-2010  due to crohn's ileitis  . Hypothyroidism, postsurgical    multinodule w/ hurthle cells  . Nephrolithiasis    bilateral  . Osteoporosis   . Perianal Crohn's disease ([place])   . Polyarthralgia   . Wears partial dentures    upper    Past Surgical History:  Procedure Laterality Date  . ABDOMINAL HYSTERECTOMY  1990   and  Appendectomy  . COLON RESECTION  x3 --  1978,  1987, 1989   ILEAL RESECTION x2/   [place]  . COLONOSCOPY WITH PROPOFOL N/A 09/25/2014   Procedure: COLONOSCOPY WITH PROPOFOL;  Surgeon: Garlan Fair, MD;  Location: [facility];  Service: Endoscopy;  Laterality: N/A;  . CYSTOSCOPY W/ URETERAL STENT PLACEMENT Right 11/19/2016   Procedure: CYSTOSCOPY WITH RIGHT RETROGRADE  PYELOGRAM/ URETEROSCOPY WITH LASER AND RIGHT URETERAL STENT PLACEMENT;  Surgeon: Ardis Hughs, MD;  Location: [facility];  Service: Urology;  Laterality: Right;  . ESOPHAGOGASTRODUODENOSCOPY  03/04/2012   Procedure: ESOPHAGOGASTRODUODENOSCOPY (EGD);  Surgeon: Garlan Fair, MD;  Location: [facility];  Service: Endoscopy;  Laterality: N/A;  . EXAMINATION UNDER ANESTHESIA N/A 09/12/2014   Procedure: EXAM UNDER ANESTHESIA;  Surgeon: Rolm Bookbinder, MD;  Location: [facility];  Service: General;  Laterality: N/A;  . FLEXIBLE SIGMOIDOSCOPY  03/04/2012   Procedure: FLEXIBLE SIGMOIDOSCOPY;  Surgeon: Garlan Fair, MD;  Location: [facility];  Service: Endoscopy;  Laterality: N/A;  . GLAUCOMA SURGERY Bilateral   . HOLMIUM LASER APPLICATION Right 1/77/9390   Procedure: HOLMIUM LASER APPLICATION;  Surgeon: Ardis Hughs, MD;  Location: [facility];  Service: Urology;  Laterality: Right;  . INCISION AND DRAINAGE PERIRECTAL ABSCESS N/A 09/12/2014   Procedure: IRRIGATION AND DEBRIDEMENT PERIRECTAL ABSCESS;  Surgeon: Rolm Bookbinder, MD;  Location: [facility];  Service: General;  Laterality: N/A;  . NEGATIVE SLEEP STUDY  2008  . PLACEMENT OF SETON N/A 12/01/2014   Procedure: PLACEMENT OF SETON;  Surgeon: [name], MD;  Location: [facility];  Service: General;  Laterality: N/A;  . RECTAL EXAM UNDER ANESTHESIA N/A 12/01/2014   Procedure: RECTAL EXAM UNDER ANESTHESIA;  Surgeon: [name], MD;  Location: [facility];  Service: General;  Laterality: N/A;  . TOTAL THYROIDECTOMY  10-13-2003  . TRANSTHORACIC ECHOCARDIOGRAM  07-11-2013   mild LVH,  ef 55%,  moderate AR,  mild MR and TR,  trivial PR    Social History   Social History  . Marital status: Married    Spouse name: [name]  . Number of children: 3  . Years of education: 12   Occupational History  . Retired Retired   Social History Main Topics  . Smoking status: Former Smoker    Packs/day: 0.25    Years:  15.00    Types: Cigarettes    Quit date: 11/27/2001  . Smokeless tobacco: Never Used  . Alcohol use No  . Drug use: No  . Sexual activity: No   Other Topics Concern  . Not on file   Social History Narrative   Patient is married [name]) and lives at home with her husband.   Patient has three children.   Patient is retired on disability.   Patient drinks two cups of caffeine daily.   Patient is right-handed.            Family History  Problem Relation Age of Onset  . Hypertension Father   . Heart disease Father   . Heart attack Father   . Diverticulitis Mother   . COPD Mother   . Hypertension Mother   . Heart disease Mother   . Heart attack Mother   . Colon cancer Brother   . Diabetes Brother   . Thyroid disease Brother     Current Outpatient Prescriptions  Medication Sig Dispense Refill  . acetaminophen (TYLENOL) 500 MG tablet Take 1,000 mg by mouth every 6 (six) hours as needed for mild pain.     . Adalimumab (HUMIRA) 40 MG/0.8ML PSKT Inject 40 mg into the skin every 14 (fourteen) days.    [name] albuterol (PROVENTIL HFA;VENTOLIN HFA) 108 (90 BASE) MCG/ACT inhaler Inhale 2 puffs into the lungs every 6 (six) hours as needed for wheezing or shortness of breath.    [name] alendronate (FOSAMAX) 70 MG tablet Take 70 mg by mouth once a week. Mondays  1  . calcitRIOL (ROCALTROL) 0.5 MCG capsule Take 0.5 mcg by mouth daily.    . Cholecalciferol (VITAMIN D3) 2000 UNITS TABS Take 2,000 Units by mouth daily.     . cyanocobalamin (,VITAMIN B-12,) 1000 MCG/ML injection Inject 1,000 mcg into the muscle See admin instructions. Every 60 days    . Fluticasone-Umeclidin-Vilant (TRELEGY ELLIPTA) 100-62.5-25 MCG/INH AEPB Inhale 1 puff into the lungs daily. 28 each 5  . folic acid (FOLVITE) 1 MG tablet Take 1 mg by mouth daily.    . furosemide (LASIX) 40 MG tablet Take 0.5 tablets (20 mg total) by mouth 3 (three) times daily as needed. (Patient taking differently: Take 20-40 mg by mouth 2 (two) times  daily. 71m in am, 281min pm) 30 tablet 0  . levothyroxine (SYNTHROID, LEVOTHROID) 125 MCG tablet Take 125 mcg by mouth every morning.     . potassium chloride SA (K-DUR,KLOR-CON) 20 MEQ tablet Take 60 mEq by mouth 3 (three) times daily.   0  . predniSONE (DELTASONE) 5 MG tablet Take 5 mg by mouth daily with breakfast.    . promethazine (PHENERGAN) 25 MG tablet Take 25 mg by mouth every 6 (six) hours as needed for nausea or vomiting.     . sodium bicarbonate 650 MG tablet Take 2 tablets (1,300 mg total) by  mouth 2 (two) times daily. 120 tablet 0  . traMADol (ULTRAM) 50 MG tablet Take 1-2 tablets (50-100 mg total) by mouth every 6 (six) hours as needed for severe pain. 20 tablet 0  . nitroGLYCERIN (NITROSTAT) 0.4 MG SL tablet Place 1 tablet (0.4 mg total) under the tongue every 5 (five) minutes as needed for chest pain. 25 tablet 3   No current facility-administered medications for this visit.     Allergies  Allergen Reactions  . Mercaptopurine Other (See Comments)    Caused pancreatitis  . Remicade [Infliximab] Other (See Comments)    "joint pain"    REVIEW OF SYSTEMS (negative unless checked):   Cardiac:  []  Chest pain or chest pressure? []  Shortness of breath upon activity? []  Shortness of breath when lying flat? []  Irregular heart rhythm?  Vascular:  []  Pain in calf, thigh, or hip brought on by walking? []  Pain in feet at night that wakes you up from your sleep? []  Blood clot in your veins? []  Leg swelling?  Pulmonary:  []  Oxygen at home? []  Productive cough? []  Wheezing?  Neurologic:  []  Sudden weakness in arms or legs? []  Sudden numbness in arms or legs? []  Sudden onset of difficult speaking or slurred speech? []  Temporary loss of vision in one eye? []  Problems with dizziness?  Gastrointestinal:  []  Blood in stool? [x]  Nausea?  Genitourinary:  []  Burning when urinating? []  Blood in urine?  Psychiatric:  []  Major depression  Hematologic:  []  Bleeding  problems? []  Problems with blood clotting?  Dermatologic:  [x]  Dermatitis?  Constitutional:  []  Fever or chills?  Ear/Nose/Throat:  []  Change in hearing? []  Nose bleeds? []  Sore throat?  Musculoskeletal:  []  Back pain? []  Joint pain? []  Muscle pain?   Physical Examination     Vitals:   01/02/17 1251  BP: 100/76  Pulse: 85  Resp: 16  Temp: 97.9 F (36.6 C)  TempSrc: Oral  SpO2: 100%  Weight: 94 lb (42.6 kg)  Height: 5' 4"  (1.626 m)   Body mass index is 16.14 kg/m.  General Alert, O x 3, Cachectic, NAD  Head [place]/AT,    Ear/Nose/ Throat Hearing grossly intact, nares without erythema or drainage, oropharynx without Erythema or Exudate, Mallampati score: 3,   Eyes PERRLA, EOMI,    Neck Supple, mid-line trachea,    Pulmonary Sym exp, good B air movt, CTA B  Cardiac RRR, Nl S1, S2, no Murmurs, No rubs, No S3,S4  Vascular Vessel Right Left  Radial Palpable Palpable  Brachial Palpable Palpable  Carotid Palpable, No Bruit Palpable, No Bruit  Aorta Not palpable N/A  Femoral Palpable Palpable  Popliteal Not palpable Not palpable  PT Faintly palpable Faintly palpable  DP Faintly palpable Faintly palpable    Gastro- intestinal soft, non-distended, non-tender to palpation, No guarding or rebound, no HSM, no masses, no CVAT B, No palpable prominent aortic pulse,    Musculo- skeletal M/S 5/5 throughout  , Extremities without ischemic changes  , No edema present, No visible varicosities , No Lipodermatosclerosis present  Neurologic Cranial nerves 2-12 intact , Pain and light touch intact in extremities , Motor exam as listed above  Psychiatric Judgement intact, Mood & affect appropriate for pt's clinical situation  Dermatologic See M/S exam for extremity exam, No rashes otherwise noted  Lymphatic  Palpable lymph nodes: None    Non-invasive Vascular Imaging   BUE Vein Mapping  (Date: 01/02/2017):   R arm: acceptable vein conduits include none   L  arm: acceptable  vein conduits include none  BUE Doppler (Date: 01/02/2017):   R arm:   Brachial: tri, 3.4 mm  Radial: tri, 1.3 mm  Ulnar: tri, 0.9 mm  L arm:   Brachial: tri, 3.5 mm  Radial: tri, 1.9 mm  Ulnar: tri, 1.1 mm   Outside Studies/Documentation   3 pages of outside documents were reviewed including: outpatient nephrology chart.   Medical Decision Making   Natasha [name] is a 71 y.o. female who presents with chronic kidney disease stage IV-V   I imaged the patient L brachial vein and it appears to be 3 mm and pliable.  Based on vein mapping and examination, this patient's permanent access options include: Left staged brachial vein transposition vs AVG placement..  I had an extensive discussion with this patient in regards to the nature of access surgery, including risk, benefits, and alternatives.    The patient is aware that the risks of access surgery include but are not limited to: bleeding, infection, steal syndrome, nerve damage, ischemic monomelic neuropathy, failure of access to mature, complications related to venous hypertension, and possible need for additional access procedures in the future. I discussed with the patient the nature of the staged access procedure, specifically the need for a second operation to transpose the first stage fistula if it matures adequately.    The patient has agreed to proceed with the above procedure which will be scheduled 17 AUG 18.   Adele Barthel, MD, FACS [facility] Office: [phone] Pager: [phone]  01/02/2017, 2:32 PM

## 2017-01-05 ENCOUNTER — Other Ambulatory Visit: Payer: Self-pay

## 2017-01-08 ENCOUNTER — Encounter (HOSPITAL_COMMUNITY): Payer: Self-pay | Admitting: *Deleted

## 2017-01-08 NOTE — Progress Notes (Signed)
Spoke with pt for pre-op call. Pt denies cardiac history, chest pain or sob. Pt states she is not diabetic.

## 2017-01-09 ENCOUNTER — Ambulatory Visit (HOSPITAL_COMMUNITY): Payer: Medicare Other | Admitting: Certified Registered Nurse Anesthetist

## 2017-01-09 ENCOUNTER — Encounter (HOSPITAL_COMMUNITY): Payer: Self-pay | Admitting: General Practice

## 2017-01-09 ENCOUNTER — Ambulatory Visit (HOSPITAL_COMMUNITY)
Admission: RE | Admit: 2017-01-09 | Discharge: 2017-01-09 | Disposition: A | Discharge status: Home / Self Care | Payer: Medicare Other | Source: Ambulatory Visit | Attending: Vascular Surgery | Admitting: Vascular Surgery

## 2017-01-09 ENCOUNTER — Encounter (HOSPITAL_COMMUNITY)
Admission: RE | Disposition: A | Discharge status: Home / Self Care | Payer: Self-pay | Source: Ambulatory Visit | Attending: Vascular Surgery

## 2017-01-09 ENCOUNTER — Telehealth: Payer: Self-pay | Admitting: Vascular Surgery

## 2017-01-09 DIAGNOSIS — E890 Postprocedural hypothyroidism: Secondary | ICD-10-CM | POA: Insufficient documentation

## 2017-01-09 DIAGNOSIS — K5090 Crohn's disease, unspecified, without complications: Secondary | ICD-10-CM | POA: Insufficient documentation

## 2017-01-09 DIAGNOSIS — Z7951 Long term (current) use of inhaled steroids: Secondary | ICD-10-CM | POA: Diagnosis not present

## 2017-01-09 DIAGNOSIS — Z833 Family history of diabetes mellitus: Secondary | ICD-10-CM | POA: Diagnosis not present

## 2017-01-09 DIAGNOSIS — K219 Gastro-esophageal reflux disease without esophagitis: Secondary | ICD-10-CM | POA: Diagnosis not present

## 2017-01-09 DIAGNOSIS — Z800 Family history of malignant neoplasm of digestive organs: Secondary | ICD-10-CM | POA: Insufficient documentation

## 2017-01-09 DIAGNOSIS — D631 Anemia in chronic kidney disease: Secondary | ICD-10-CM | POA: Insufficient documentation

## 2017-01-09 DIAGNOSIS — Z9889 Other specified postprocedural states: Secondary | ICD-10-CM | POA: Diagnosis not present

## 2017-01-09 DIAGNOSIS — Z836 Family history of other diseases of the respiratory system: Secondary | ICD-10-CM | POA: Diagnosis not present

## 2017-01-09 DIAGNOSIS — Z90710 Acquired absence of both cervix and uterus: Secondary | ICD-10-CM | POA: Insufficient documentation

## 2017-01-09 DIAGNOSIS — Z8249 Family history of ischemic heart disease and other diseases of the circulatory system: Secondary | ICD-10-CM | POA: Diagnosis not present

## 2017-01-09 DIAGNOSIS — Z79899 Other long term (current) drug therapy: Secondary | ICD-10-CM | POA: Insufficient documentation

## 2017-01-09 DIAGNOSIS — M1990 Unspecified osteoarthritis, unspecified site: Secondary | ICD-10-CM | POA: Diagnosis not present

## 2017-01-09 DIAGNOSIS — N184 Chronic kidney disease, stage 4 (severe): Secondary | ICD-10-CM | POA: Diagnosis not present

## 2017-01-09 DIAGNOSIS — N185 Chronic kidney disease, stage 5: Secondary | ICD-10-CM | POA: Insufficient documentation

## 2017-01-09 DIAGNOSIS — Z8719 Personal history of other diseases of the digestive system: Secondary | ICD-10-CM | POA: Insufficient documentation

## 2017-01-09 DIAGNOSIS — Z87442 Personal history of urinary calculi: Secondary | ICD-10-CM | POA: Diagnosis not present

## 2017-01-09 DIAGNOSIS — Z9049 Acquired absence of other specified parts of digestive tract: Secondary | ICD-10-CM | POA: Diagnosis not present

## 2017-01-09 DIAGNOSIS — I70208 Unspecified atherosclerosis of native arteries of extremities, other extremity: Secondary | ICD-10-CM | POA: Insufficient documentation

## 2017-01-09 DIAGNOSIS — M810 Age-related osteoporosis without current pathological fracture: Secondary | ICD-10-CM | POA: Insufficient documentation

## 2017-01-09 DIAGNOSIS — J449 Chronic obstructive pulmonary disease, unspecified: Secondary | ICD-10-CM | POA: Diagnosis not present

## 2017-01-09 DIAGNOSIS — Z8371 Family history of colonic polyps: Secondary | ICD-10-CM | POA: Diagnosis not present

## 2017-01-09 DIAGNOSIS — Z87891 Personal history of nicotine dependence: Secondary | ICD-10-CM | POA: Diagnosis not present

## 2017-01-09 DIAGNOSIS — Z8349 Family history of other endocrine, nutritional and metabolic diseases: Secondary | ICD-10-CM | POA: Diagnosis not present

## 2017-01-09 HISTORY — PX: BASCILIC VEIN TRANSPOSITION: SHX5742

## 2017-01-09 HISTORY — DX: Personal history of urinary calculi: Z87.442

## 2017-01-09 HISTORY — DX: Dyspnea, unspecified: R06.00

## 2017-01-09 HISTORY — DX: Peripheral vascular disease, unspecified: I73.9

## 2017-01-09 LAB — POCT I-STAT 4, (NA,K, GLUC, HGB,HCT)
Glucose, Bld: 82 mg/dL (ref 65–99)
HCT: 32 % — ABNORMAL LOW (ref 36.0–46.0)
HEMOGLOBIN: 10.9 g/dL — AB (ref 12.0–15.0)
Potassium: 3.7 mmol/L (ref 3.5–5.1)
SODIUM: 144 mmol/L (ref 135–145)

## 2017-01-09 SURGERY — TRANSPOSITION, VEIN, BASILIC
Anesthesia: Monitor Anesthesia Care | Site: Arm Upper | Laterality: Left

## 2017-01-09 MED ORDER — DEXTROSE 5 % IV SOLN
INTRAVENOUS | Status: DC | PRN
  Administered 2017-01-09: 40 ug/min via INTRAVENOUS

## 2017-01-09 MED ORDER — PHENYLEPHRINE HCL 10 MG/ML IJ SOLN
INTRAMUSCULAR | Status: DC | PRN
  Administered 2017-01-09: 80 ug via INTRAVENOUS

## 2017-01-09 MED ORDER — FENTANYL CITRATE (PF) 100 MCG/2ML IJ SOLN: 25.0000 ug | INTRAMUSCULAR | Status: DC | PRN

## 2017-01-09 MED ORDER — LIDOCAINE HCL (PF) 1 % IJ SOLN
INTRAMUSCULAR | Status: DC | PRN
  Administered 2017-01-09: 4 mL via SUBCUTANEOUS

## 2017-01-09 MED ORDER — FENTANYL CITRATE (PF) 100 MCG/2ML IJ SOLN
INTRAMUSCULAR | Status: DC | PRN
  Administered 2017-01-09 (×2): 50 ug via INTRAVENOUS

## 2017-01-09 MED ORDER — PROPOFOL 500 MG/50ML IV EMUL
INTRAVENOUS | Status: DC | PRN
  Administered 2017-01-09: 100 ug/kg/min via INTRAVENOUS

## 2017-01-09 MED ORDER — DEXTROSE 5 % IV SOLN
1.5000 g | INTRAVENOUS | Status: AC
  Administered 2017-01-09: 1.5 g via INTRAVENOUS

## 2017-01-09 MED ORDER — TRAMADOL HCL 50 MG PO TABS: 50.0000 mg | ORAL_TABLET | Freq: Four times a day (QID) | ORAL | 0 refills | Status: DC | PRN

## 2017-01-09 MED ORDER — CHLORHEXIDINE GLUCONATE CLOTH 2 % EX PADS: 6.0000 | MEDICATED_PAD | Freq: Once | CUTANEOUS | Status: DC

## 2017-01-09 MED ORDER — PROPOFOL 10 MG/ML IV BOLUS
INTRAVENOUS | Status: DC | PRN
Start: 2017-01-09 — End: 2017-01-09
  Administered 2017-01-09: 20 mg via INTRAVENOUS
  Administered 2017-01-09: 30 mg via INTRAVENOUS

## 2017-01-09 MED ORDER — 0.9 % SODIUM CHLORIDE (POUR BTL) OPTIME
TOPICAL | Status: DC | PRN
  Administered 2017-01-09: 1000 mL

## 2017-01-09 MED ORDER — SODIUM CHLORIDE 0.9 % IV SOLN
INTRAVENOUS | Status: DC
  Administered 2017-01-09: 08:00:00 via INTRAVENOUS

## 2017-01-09 MED ORDER — FENTANYL CITRATE (PF) 250 MCG/5ML IJ SOLN
INTRAMUSCULAR | Status: AC
  Filled 2017-01-09: qty 5

## 2017-01-09 MED ORDER — ONDANSETRON HCL 4 MG/2ML IJ SOLN: 4.0000 mg | Freq: Once | INTRAMUSCULAR | Status: DC | PRN

## 2017-01-09 MED ORDER — DEXTROSE 5 % IV SOLN
INTRAVENOUS | Status: AC
  Filled 2017-01-09: qty 1.5

## 2017-01-09 MED ORDER — LIDOCAINE HCL (PF) 1 % IJ SOLN
INTRAMUSCULAR | Status: AC
  Filled 2017-01-09: qty 30

## 2017-01-09 MED ORDER — SODIUM CHLORIDE 0.9 % IV SOLN
INTRAVENOUS | Status: DC | PRN
  Administered 2017-01-09: 500 mL

## 2017-01-09 SURGICAL SUPPLY — 42 items
ADH SKN CLS APL DERMABOND .7 (GAUZE/BANDAGES/DRESSINGS) ×1
AGENT HMST SPONGE THK3/8 (HEMOSTASIS)
ARMBAND PINK RESTRICT EXTREMIT (MISCELLANEOUS) ×3 IMPLANT
CANISTER SUCT 3000ML PPV (MISCELLANEOUS) ×3 IMPLANT
CLIP VESOCCLUDE MED 24/CT (CLIP) ×3 IMPLANT
CLIP VESOCCLUDE SM WIDE 24/CT (CLIP) ×3 IMPLANT
CORDS BIPOLAR (ELECTRODE) IMPLANT
COVER PROBE W GEL 5X96 (DRAPES) IMPLANT
DECANTER SPIKE VIAL GLASS SM (MISCELLANEOUS) ×3 IMPLANT
DERMABOND ADVANCED (GAUZE/BANDAGES/DRESSINGS) ×2
DERMABOND ADVANCED .7 DNX12 (GAUZE/BANDAGES/DRESSINGS) ×1 IMPLANT
DRAPE ORTHO SPLIT 77X108 STRL (DRAPES) ×3
DRAPE SURG ORHT 6 SPLT 77X108 (DRAPES) IMPLANT
ELECT REM PT RETURN 9FT ADLT (ELECTROSURGICAL) ×3
ELECTRODE REM PT RTRN 9FT ADLT (ELECTROSURGICAL) ×1 IMPLANT
GLOVE BIO SURGEON STRL SZ7 (GLOVE) ×3 IMPLANT
GLOVE BIOGEL PI IND STRL 6.5 (GLOVE) IMPLANT
GLOVE BIOGEL PI IND STRL 7.5 (GLOVE) ×1 IMPLANT
GLOVE BIOGEL PI INDICATOR 6.5 (GLOVE) ×2
GLOVE BIOGEL PI INDICATOR 7.5 (GLOVE) ×2
GLOVE SURG SS PI 6.5 STRL IVOR (GLOVE) ×2 IMPLANT
GLOVE SURG SS PI 7.0 STRL IVOR (GLOVE) ×2 IMPLANT
GOWN STRL REUS W/ TWL LRG LVL3 (GOWN DISPOSABLE) ×3 IMPLANT
GOWN STRL REUS W/ TWL XL LVL3 (GOWN DISPOSABLE) IMPLANT
GOWN STRL REUS W/TWL LRG LVL3 (GOWN DISPOSABLE) ×12
GOWN STRL REUS W/TWL XL LVL3 (GOWN DISPOSABLE) ×3
HEMOSTAT SPONGE AVITENE ULTRA (HEMOSTASIS) IMPLANT
KIT BASIN OR (CUSTOM PROCEDURE TRAY) ×3 IMPLANT
KIT ROOM TURNOVER OR (KITS) ×3 IMPLANT
NS IRRIG 1000ML POUR BTL (IV SOLUTION) ×3 IMPLANT
PACK CV ACCESS (CUSTOM PROCEDURE TRAY) ×3 IMPLANT
PAD ARMBOARD 7.5X6 YLW CONV (MISCELLANEOUS) ×6 IMPLANT
SUT MNCRL AB 4-0 PS2 18 (SUTURE) ×3 IMPLANT
SUT PROLENE 6 0 BV (SUTURE) ×3 IMPLANT
SUT PROLENE 7 0 BV 1 (SUTURE) ×2 IMPLANT
SUT SILK 2 0 SH (SUTURE) IMPLANT
SUT VIC AB 2-0 CT1 27 (SUTURE) ×3
SUT VIC AB 2-0 CT1 TAPERPNT 27 (SUTURE) ×1 IMPLANT
SUT VIC AB 3-0 SH 27 (SUTURE) ×6
SUT VIC AB 3-0 SH 27X BRD (SUTURE) ×2 IMPLANT
UNDERPAD 30X30 (UNDERPADS AND DIAPERS) ×3 IMPLANT
WATER STERILE IRR 1000ML POUR (IV SOLUTION) ×3 IMPLANT

## 2017-01-09 NOTE — Interval H&P Note (Signed)
History and Physical Interval Note:  01/09/2017 6:38 AM  Natasha [name]  has presented today for surgery, with the diagnosis of Chronic Kidney Disease Stage 4  The various methods of treatment have been discussed with the patient and family. After consideration of risks, benefits and other options for treatment, the patient has consented to  Procedure(s): LEFT 1ST STAGE BRACHIAL VEIN TRANSPOSITION (Left) as a surgical intervention .  The patient's history has been reviewed, patient examined, no change in status, stable for surgery.  I have reviewed the patient's chart and labs.  Questions were answered to the patient's satisfaction.     Adele Barthel

## 2017-01-09 NOTE — Telephone Encounter (Signed)
Spoke to pt for appt on 10/5 mailed letter

## 2017-01-09 NOTE — Anesthesia Preprocedure Evaluation (Addendum)
Anesthesia Evaluation  Patient identified by MRN, date of birth, ID band Patient awake    Reviewed: Allergy & Precautions, NPO status , Patient's Chart, lab work & pertinent test results  History of Anesthesia Complications Negative for: history of anesthetic complications  Airway Mallampati: II  TM Distance: >3 FB Neck ROM: Full    Dental  (+) Chipped, Missing, Dental Advisory Given,    Pulmonary COPD (controlled),  COPD inhaler, former smoker,    breath sounds clear to auscultation       Cardiovascular hypertension, (-) angina+ Valvular Problems/Murmurs AI  Rhythm:Regular Rate:Normal  ECG: NSR, rate 87  3/18 ECHO: EF 60-65%, mod AI, mild MR   Neuro/Psych negative neurological ROS     GI/Hepatic Neg liver ROS, GERD  Controlled,  Endo/Other  Hypothyroidism   Renal/GU CRFRenal disease (K+ 5.1, creat 4.19)CKD, stage IV     Musculoskeletal  (+) Arthritis ,   Abdominal   Peds  Hematology  (+) Blood dyscrasia (Hb 10.0), anemia ,   Anesthesia Other Findings   Reproductive/Obstetrics                             Anesthesia Physical  Anesthesia Plan  ASA: III  Anesthesia Plan: MAC   Post-op Pain Management:    Induction: Intravenous  PONV Risk Score and Plan: 2 and Ondansetron, Dexamethasone, Treatment may vary due to age or medical condition and Propofol infusion  Airway Management Planned: Natural Airway  Additional Equipment:   Intra-op Plan:   Post-operative Plan:   Informed Consent: I have reviewed the patients History and Physical, chart, labs and discussed the procedure including the risks, benefits and alternatives for the proposed anesthesia with the patient or authorized representative who has indicated his/her understanding and acceptance.   Dental advisory given  Plan Discussed with: CRNA and Surgeon  Anesthesia Plan Comments:         Anesthesia Quick  Evaluation

## 2017-01-09 NOTE — Op Note (Addendum)
OPERATIVE NOTE   PROCEDURE: 1. left first stage brachial vein transposition (brachiobrachial arteriovenous fistula) placement  PRE-OPERATIVE DIAGNOSIS: chronic kidney disease stage IV-V   POST-OPERATIVE DIAGNOSIS: same as above   SURGEON: Adele Barthel, MD  ASSISTANT(S): Gerri Lins, PAC   ANESTHESIA: local and MAC  ESTIMATED BLOOD LOSS: 30 cc  FINDINGS: 1.  Brachial vein: 3 mm, acceptable 2.  Brachial artery: 2.5 mm, minimal atherosclerosis 3.  Venous outflow: faintly palpable thrill  4.  Radial flow: palpable radial pulse  SPECIMEN(S):  none  INDICATIONS:   [name] is a 71 y.o. female who presents with chronic kidney disease stage IV-V.  The patient is scheduled for left first stage brachial vein transposition.  The patient is aware the risks include but are not limited to: bleeding, infection, steal syndrome, nerve damage, ischemic monomelic neuropathy, failure to mature, and need for additional procedures.  The patient is aware of the risks of the procedure and elects to proceed forward.  DESCRIPTION: After full informed written consent was obtained from the patient, the patient was brought back to the operating room and placed supine upon the operating table.  Prior to induction, the patient received IV antibiotics.   After obtaining adequate anesthesia, the patient was then prepped and draped in the standard fashion for a leftarm access procedure.    I turned my attention first to identifying the patient's brachial vein and brachial artery.  Using SonoSite guidance, the location of these vessels were marked out on the skin.   At this point, I injected local anesthetic to obtain a field block of the antecubitum.  In total, I injected about 4 mL of 1% lidocaine without epinephrine.  I made a longitudinal incision at the level of the antecubitum and dissected through the subcutaneous tissue and fascia to gain exposure of the brachial artery.  This was noted to be  2.5 mm in diameter externally.  This was dissected out proximally and distally and controlled with vessel loops .  I then dissected out the brachial vein.  This was noted to be 3 mm in diameter externally.  The distal segment of the vein was ligated with a  2-0 silk, and the vein was transected.  The proximal segment was interrogated with serial dilators.  The vein accepted up to a 3 mm dilator without any difficulty.  I then instilled the heparinized saline into the vein and clamped it.  At this point, I reset my exposure of the brachial artery and placed the artery under tension proximally and distally.  I made an arteriotomy with a #11 blade, and then I extended the arteriotomy with a Potts scissor.  I injected heparinized saline proximal and distal to this arteriotomy.  The vein was then sewn to the artery in an end-to-side configuration with a running stitch of 7-0 Prolene.  Prior to completing this anastomosis, I allowed the vein and artery to backbleed.  There was no evidence of clot from any vessels.  I completed the anastomosis in the usual fashion and then released all vessel loops and clamps.    There was a faintly palpable thrill in the venous outflow, and there was a palpable radial pulse.   At this point, I irrigated out the surgical wound.  There was no further active bleeding.  The subcutaneous tissue was reapproximated with a running stitch of 3-0 Vicryl.  The skin was then reapproximated with a running subcuticular stitch of 4-0 Vicryl.  The skin was then cleaned,  dried, and reinforced with Liquidband.  The patient tolerated this procedure well.    COMPLICATIONS: none  CONDITION: stable   Adele Barthel, MD, [facility] Office: [phone] Pager: [phone]  01/09/2017, 8:30 AM

## 2017-01-09 NOTE — Anesthesia Procedure Notes (Signed)
Procedure Name: MAC Date/Time: 01/09/2017 7:30 AM Performed by: Trixie Deis A Pre-anesthesia Checklist: Patient identified, Emergency Drugs available, Suction available, Timeout performed and Patient being monitored Patient Re-evaluated:Patient Re-evaluated prior to induction Oxygen Delivery Method: Simple face mask Placement Confirmation: positive ETCO2 Dental Injury: Teeth and Oropharynx as per pre-operative assessment

## 2017-01-09 NOTE — Transfer of Care (Signed)
Immediate Anesthesia Transfer of Care Note  Patient: Natasha [name]  Procedure(s) Performed: Procedure(s): LEFT 1ST STAGE BRACHIAL VEIN TRANSPOSITION (Left)  Patient Location: PACU  Anesthesia Type:MAC  Level of Consciousness: awake, alert  and oriented  Airway & Oxygen Therapy: Patient Spontanous Breathing  Post-op Assessment: Report given to RN, Post -op Vital signs reviewed and stable and Patient moving all extremities X 4  Post vital signs: Reviewed and stable  Last Vitals:  Vitals:   01/09/17 0600  BP: 131/71  Pulse: (!) 102  Resp: 18  Temp: 36.5 C  SpO2: 100%    Last Pain: There were no vitals filed for this visit.       Complications: No apparent anesthesia complications

## 2017-01-09 NOTE — H&P (View-Only) (Signed)
Requested by:  [name], MD [address] [place], [place] [address]  Reason for consultation: New access   History of Present Illness   Natasha [name] is a 70 y.o. ([birth year]) female who presents for evaluation for permanent access.  The patient is right hand dominant.  The patient has not had previous access procedures.  Previous central venous cannulation procedures include: none.  The patient has never had a PPM placed.   The patient is having active symptoms: nausea, dermatitis, and foggy thinking.  Past Medical History:  Diagnosis Date  . Anal fistula   . Anemia in chronic kidney disease (CKD)   . Aortic insufficiency    Echo 3/18: mod conc LVH, EF 60-65, no RWMA, Gr 1 DD, mod AI, mild MR, normal RVSF, Trivial TR  . Arthritis   . Borderline hypertension   . Bulging disc    L3-L4  . Chronic diarrhea    due to crohn's  . CKD (chronic kidney disease), stage IV ([place])    NEPHROLOGIST--  DR MATTINGLY  . Crohn's disease ([place])    chronic ileitis  . Emphysema/COPD ([place])   . GERD (gastroesophageal reflux disease)   . History of glaucoma   . History of pancreatitis    2008--  mercaptopurine  . History of small bowel obstruction    12-03-2010  due to crohn's ileitis  . Hypothyroidism, postsurgical    multinodule w/ hurthle cells  . Nephrolithiasis    bilateral  . Osteoporosis   . Perianal Crohn's disease ([place])   . Polyarthralgia   . Wears partial dentures    upper    Past Surgical History:  Procedure Laterality Date  . ABDOMINAL HYSTERECTOMY  1990   and  Appendectomy  . COLON RESECTION  x3 --  1978,  1987, 1989   ILEAL RESECTION x2/   [place]  . COLONOSCOPY WITH PROPOFOL N/A 09/25/2014   Procedure: COLONOSCOPY WITH PROPOFOL;  Surgeon: Garlan Fair, MD;  Location: [facility];  Service: Endoscopy;  Laterality: N/A;  . CYSTOSCOPY W/ URETERAL STENT PLACEMENT Right 11/19/2016   Procedure: CYSTOSCOPY WITH RIGHT RETROGRADE  PYELOGRAM/ URETEROSCOPY WITH LASER AND RIGHT URETERAL STENT PLACEMENT;  Surgeon: Ardis Hughs, MD;  Location: [facility];  Service: Urology;  Laterality: Right;  . ESOPHAGOGASTRODUODENOSCOPY  03/04/2012   Procedure: ESOPHAGOGASTRODUODENOSCOPY (EGD);  Surgeon: Garlan Fair, MD;  Location: [facility];  Service: Endoscopy;  Laterality: N/A;  . EXAMINATION UNDER ANESTHESIA N/A 09/12/2014   Procedure: EXAM UNDER ANESTHESIA;  Surgeon: Rolm Bookbinder, MD;  Location: [facility];  Service: General;  Laterality: N/A;  . FLEXIBLE SIGMOIDOSCOPY  03/04/2012   Procedure: FLEXIBLE SIGMOIDOSCOPY;  Surgeon: Garlan Fair, MD;  Location: [facility];  Service: Endoscopy;  Laterality: N/A;  . GLAUCOMA SURGERY Bilateral   . HOLMIUM LASER APPLICATION Right 11/29/2374   Procedure: HOLMIUM LASER APPLICATION;  Surgeon: Ardis Hughs, MD;  Location: [facility];  Service: Urology;  Laterality: Right;  . INCISION AND DRAINAGE PERIRECTAL ABSCESS N/A 09/12/2014   Procedure: IRRIGATION AND DEBRIDEMENT PERIRECTAL ABSCESS;  Surgeon: Rolm Bookbinder, MD;  Location: [facility];  Service: General;  Laterality: N/A;  . NEGATIVE SLEEP STUDY  2008  . PLACEMENT OF SETON N/A 12/01/2014   Procedure: PLACEMENT OF SETON;  Surgeon: [name], MD;  [facility];  Service: General;  Laterality: N/A;  . RECTAL EXAM UNDER ANESTHESIA N/A 12/01/2014   Procedure: RECTAL EXAM UNDER ANESTHESIA;  Surgeon: [name], MD;  Location: [facility];  Service: General;  Laterality: N/A;  . TOTAL THYROIDECTOMY  10-13-2003  . TRANSTHORACIC ECHOCARDIOGRAM  07-11-2013   mild LVH,  ef 55%,  moderate AR,  mild MR and TR,  trivial PR    Social History   Social History  . Marital status: Married    Spouse name: [name]  . Number of children: 3  . Years of education: 12   Occupational History  . Retired Retired   Social History Main Topics  . Smoking status: Former Smoker    Packs/day: 0.25    Years:  15.00    Types: Cigarettes    Quit date: 11/27/2001  . Smokeless tobacco: Never Used  . Alcohol use No  . Drug use: No  . Sexual activity: No   Other Topics Concern  . Not on file   Social History Narrative   Patient is married [name]) and lives at home with her husband.   Patient has three children.   Patient is retired on disability.   Patient drinks two cups of caffeine daily.   Patient is right-handed.            Family History  Problem Relation Age of Onset  . Hypertension Father   . Heart disease Father   . Heart attack Father   . Diverticulitis Mother   . COPD Mother   . Hypertension Mother   . Heart disease Mother   . Heart attack Mother   . Colon cancer Brother   . Diabetes Brother   . Thyroid disease Brother     Current Outpatient Prescriptions  Medication Sig Dispense Refill  . acetaminophen (TYLENOL) 500 MG tablet Take 1,000 mg by mouth every 6 (six) hours as needed for mild pain.     . Adalimumab (HUMIRA) 40 MG/0.8ML PSKT Inject 40 mg into the skin every 14 (fourteen) days.    [name] albuterol (PROVENTIL HFA;VENTOLIN HFA) 108 (90 BASE) MCG/ACT inhaler Inhale 2 puffs into the lungs every 6 (six) hours as needed for wheezing or shortness of breath.    [name] alendronate (FOSAMAX) 70 MG tablet Take 70 mg by mouth once a week. Mondays  1  . calcitRIOL (ROCALTROL) 0.5 MCG capsule Take 0.5 mcg by mouth daily.    . Cholecalciferol (VITAMIN D3) 2000 UNITS TABS Take 2,000 Units by mouth daily.     . cyanocobalamin (,VITAMIN B-12,) 1000 MCG/ML injection Inject 1,000 mcg into the muscle See admin instructions. Every 60 days    . Fluticasone-Umeclidin-Vilant (TRELEGY ELLIPTA) 100-62.5-25 MCG/INH AEPB Inhale 1 puff into the lungs daily. 28 each 5  . folic acid (FOLVITE) 1 MG tablet Take 1 mg by mouth daily.    . furosemide (LASIX) 40 MG tablet Take 0.5 tablets (20 mg total) by mouth 3 (three) times daily as needed. (Patient taking differently: Take 20-40 mg by mouth 2 (two) times  daily. 69m in am, 233min pm) 30 tablet 0  . levothyroxine (SYNTHROID, LEVOTHROID) 125 MCG tablet Take 125 mcg by mouth every morning.     . potassium chloride SA (K-DUR,KLOR-CON) 20 MEQ tablet Take 60 mEq by mouth 3 (three) times daily.   0  . predniSONE (DELTASONE) 5 MG tablet Take 5 mg by mouth daily with breakfast.    . promethazine (PHENERGAN) 25 MG tablet Take 25 mg by mouth every 6 (six) hours as needed for nausea or vomiting.     . sodium bicarbonate 650 MG tablet Take 2 tablets (1,300 mg total) by  mouth 2 (two) times daily. 120 tablet 0  . traMADol (ULTRAM) 50 MG tablet Take 1-2 tablets (50-100 mg total) by mouth every 6 (six) hours as needed for severe pain. 20 tablet 0  . nitroGLYCERIN (NITROSTAT) 0.4 MG SL tablet Place 1 tablet (0.4 mg total) under the tongue every 5 (five) minutes as needed for chest pain. 25 tablet 3   No current facility-administered medications for this visit.     Allergies  Allergen Reactions  . Mercaptopurine Other (See Comments)    Caused pancreatitis  . Remicade [Infliximab] Other (See Comments)    "joint pain"    REVIEW OF SYSTEMS (negative unless checked):   Cardiac:  []  Chest pain or chest pressure? []  Shortness of breath upon activity? []  Shortness of breath when lying flat? []  Irregular heart rhythm?  Vascular:  []  Pain in calf, thigh, or hip brought on by walking? []  Pain in feet at night that wakes you up from your sleep? []  Blood clot in your veins? []  Leg swelling?  Pulmonary:  []  Oxygen at home? []  Productive cough? []  Wheezing?  Neurologic:  []  Sudden weakness in arms or legs? []  Sudden numbness in arms or legs? []  Sudden onset of difficult speaking or slurred speech? []  Temporary loss of vision in one eye? []  Problems with dizziness?  Gastrointestinal:  []  Blood in stool? [x]  Nausea?  Genitourinary:  []  Burning when urinating? []  Blood in urine?  Psychiatric:  []  Major depression  Hematologic:  []  Bleeding  problems? []  Problems with blood clotting?  Dermatologic:  [x]  Dermatitis?  Constitutional:  []  Fever or chills?  Ear/Nose/Throat:  []  Change in hearing? []  Nose bleeds? []  Sore throat?  Musculoskeletal:  []  Back pain? []  Joint pain? []  Muscle pain?   Physical Examination     Vitals:   01/02/17 1251  BP: 100/76  Pulse: 85  Resp: 16  Temp: 97.9 F (36.6 C)  TempSrc: Oral  SpO2: 100%  Weight: 94 lb (42.6 kg)  Height: 5' 4"  (1.626 m)   Body mass index is 16.14 kg/m.  General Alert, O x 3, Cachectic, NAD  Head [place]/AT,    Ear/Nose/ Throat Hearing grossly intact, nares without erythema or drainage, oropharynx without Erythema or Exudate, Mallampati score: 3,   Eyes PERRLA, EOMI,    Neck Supple, mid-line trachea,    Pulmonary Sym exp, good B air movt, CTA B  Cardiac RRR, Nl S1, S2, no Murmurs, No rubs, No S3,S4  Vascular Vessel Right Left  Radial Palpable Palpable  Brachial Palpable Palpable  Carotid Palpable, No Bruit Palpable, No Bruit  Aorta Not palpable N/A  Femoral Palpable Palpable  Popliteal Not palpable Not palpable  PT Faintly palpable Faintly palpable  DP Faintly palpable Faintly palpable    Gastro- intestinal soft, non-distended, non-tender to palpation, No guarding or rebound, no HSM, no masses, no CVAT B, No palpable prominent aortic pulse,    Musculo- skeletal M/S 5/5 throughout  , Extremities without ischemic changes  , No edema present, No visible varicosities , No Lipodermatosclerosis present  Neurologic Cranial nerves 2-12 intact , Pain and light touch intact in extremities , Motor exam as listed above  Psychiatric Judgement intact, Mood & affect appropriate for pt's clinical situation  Dermatologic See M/S exam for extremity exam, No rashes otherwise noted  Lymphatic  Palpable lymph nodes: None    Non-invasive Vascular Imaging   BUE Vein Mapping  (Date: 01/02/2017):   R arm: acceptable vein conduits include none   L  arm: acceptable  vein conduits include none  BUE Doppler (Date: 01/02/2017):   R arm:   Brachial: tri, 3.4 mm  Radial: tri, 1.3 mm  Ulnar: tri, 0.9 mm  L arm:   Brachial: tri, 3.5 mm  Radial: tri, 1.9 mm  Ulnar: tri, 1.1 mm   Outside Studies/Documentation   3 pages of outside documents were reviewed including: outpatient nephrology chart.   Medical Decision Making   Natasha [name] is a 71 y.o. female who presents with chronic kidney disease stage IV-V   I imaged the patient L brachial vein and it appears to be 3 mm and pliable.  Based on vein mapping and examination, this patient's permanent access options include: Left staged brachial vein transposition vs AVG placement..  I had an extensive discussion with this patient in regards to the nature of access surgery, including risk, benefits, and alternatives.    The patient is aware that the risks of access surgery include but are not limited to: bleeding, infection, steal syndrome, nerve damage, ischemic monomelic neuropathy, failure of access to mature, complications related to venous hypertension, and possible need for additional access procedures in the future. I discussed with the patient the nature of the staged access procedure, specifically the need for a second operation to transpose the first stage fistula if it matures adequately.    The patient has agreed to proceed with the above procedure which will be scheduled 17 AUG 18.   Adele Barthel, MD, FACS [facility] Office: [phone] Pager: [phone]  01/02/2017, 2:32 PM

## 2017-01-09 NOTE — Telephone Encounter (Signed)
-----   Message from Mena Goes, RN sent at 01/09/2017  8:51 AM EDT ----- Regarding: 6-7 weeks no study   ----- Message ----- From: Ulyses Amor, PA-C Sent: 01/09/2017   8:46 AM To: [facility] Charge Pool  F/U with Dr. Bridgett Larsson in 6 -7 weeks no study needed s/p left AV fistula creation

## 2017-01-09 NOTE — Anesthesia Postprocedure Evaluation (Signed)
Anesthesia Post Note  Patient: Natasha [name]  Procedure(s) Performed: Procedure(s) (LRB): LEFT 1ST STAGE BRACHIAL VEIN TRANSPOSITION (Left)     Patient location during evaluation: PACU Anesthesia Type: MAC Level of consciousness: awake and alert Pain management: pain level controlled Vital Signs Assessment: post-procedure vital signs reviewed and stable Respiratory status: spontaneous breathing, nonlabored ventilation, respiratory function stable and patient connected to nasal cannula oxygen Cardiovascular status: stable and blood pressure returned to baseline Anesthetic complications: no    Last Vitals:  Vitals:   01/09/17 0905 01/09/17 0914  BP: 116/67 132/70  Pulse: 76 73  Resp: 15   Temp: (!) 36.4 C   SpO2: 99% 99%    Last Pain: There were no vitals filed for this visit.               Ryan P Ellender

## 2017-01-10 ENCOUNTER — Encounter (HOSPITAL_COMMUNITY): Payer: Self-pay | Admitting: Vascular Surgery

## 2017-02-24 NOTE — Progress Notes (Signed)
    Postoperative Access Visit   History of Present Illness   Natasha [name] is a 71 y.o. year old female who presents for postoperative follow-up for: left first stage brachial vein transposition (Date: 01/09/17).  The patient's wounds are healed.  The patient notes no steal symptoms.  The patient is able to complete their activities of daily living.  The patient's current symptoms are: none.   Physical Examination   Vitals:   02/27/17 1359  BP: 100/61  Pulse: 100  Resp: 16  SpO2: 100%  Weight: 93 lb (42.2 kg)  Height: 5' 2.5" (1.588 m)    left arm Incision is healed, skin feels warm, hand grip is 5/5, sensation in digits is intact, palpable thrill, bruit can be auscultated, fistula 5-5.5 mm    Medical Decision Making   Natasha [name] is a 71 y.o. year old female who presents s/p left first stage brachial vein transposition   Patient will follow up in 4 weeks for check on maturation.  Given dilation from 3 mm to >5 mm in 6 weeks, I am hopeful for this fistula.  Thank you for allowing [place] to participate in this patient's care.   Adele Barthel, MD, FACS [facility] Office: [phone] Pager: [phone]

## 2017-02-27 ENCOUNTER — Encounter: Payer: Self-pay | Admitting: Vascular Surgery

## 2017-02-27 ENCOUNTER — Ambulatory Visit (INDEPENDENT_AMBULATORY_CARE_PROVIDER_SITE_OTHER): Payer: Self-pay | Admitting: Vascular Surgery

## 2017-02-27 ENCOUNTER — Ambulatory Visit (INDEPENDENT_AMBULATORY_CARE_PROVIDER_SITE_OTHER): Payer: Medicare Other | Admitting: Pulmonary Disease

## 2017-02-27 ENCOUNTER — Encounter: Payer: Self-pay | Admitting: Pulmonary Disease

## 2017-02-27 VITALS — BP 102/68 | HR 87 | Ht 65.0 in | Wt 91.8 lb

## 2017-02-27 VITALS — BP 100/61 | HR 100 | Resp 16 | Ht 62.5 in | Wt 93.0 lb

## 2017-02-27 DIAGNOSIS — Z01811 Encounter for preprocedural respiratory examination: Secondary | ICD-10-CM

## 2017-02-27 DIAGNOSIS — N184 Chronic kidney disease, stage 4 (severe): Secondary | ICD-10-CM

## 2017-02-27 DIAGNOSIS — J432 Centrilobular emphysema: Secondary | ICD-10-CM

## 2017-02-27 MED ORDER — FLUTICASONE-UMECLIDIN-VILANT 100-62.5-25 MCG/INH IN AEPB: 1.0000 | INHALATION_SPRAY | Freq: Once | RESPIRATORY_TRACT | 0 refills | Status: AC

## 2017-02-27 MED ORDER — FLUTICASONE-UMECLIDIN-VILANT 100-62.5-25 MCG/INH IN AEPB: 1.0000 | INHALATION_SPRAY | Freq: Every day | RESPIRATORY_TRACT | 5 refills | Status: DC

## 2017-02-27 NOTE — Addendum Note (Signed)
Addended by: Georjean Mode on: 02/27/2017 12:24 PM   Modules accepted: Orders

## 2017-02-27 NOTE — Patient Instructions (Signed)
Trelegy one puff daily, and rinse mouth after each use  Follow up in 6 months

## 2017-02-27 NOTE — Progress Notes (Signed)
Current Outpatient Prescriptions on File Prior to Visit  Medication Sig  . acetaminophen (TYLENOL) 325 MG tablet Take 325 mg by mouth every 6 (six) hours as needed for mild pain.  . Adalimumab (HUMIRA) 40 MG/0.8ML PSKT Inject 40 mg into the skin every 14 (fourteen) days.  [name] albuterol (PROVENTIL HFA;VENTOLIN HFA) 108 (90 BASE) MCG/ACT inhaler Inhale 2 puffs into the lungs every 6 (six) hours as needed for wheezing or shortness of breath.  [name] alendronate (FOSAMAX) 70 MG tablet Take 70 mg by mouth once a week. Mondays  . calcitRIOL (ROCALTROL) 0.25 MCG capsule Take 0.5 mcg by mouth daily.  . calcium-vitamin D (OSCAL WITH D) 500-200 MG-UNIT tablet Take 1 tablet by mouth 2 (two) times daily.  . Cholecalciferol (VITAMIN D3) 2000 UNITS TABS Take 2,000 Units by mouth 2 (two) times daily.   . cyanocobalamin (,VITAMIN B-12,) 1000 MCG/ML injection Inject 1,000 mcg into the muscle See admin instructions. Every 60 days  . folic acid (FOLVITE) 1 MG tablet Take 1 mg by mouth daily.  . furosemide (LASIX) 20 MG tablet Take 20 mg by mouth 2 (two) times daily with a meal.  . levothyroxine (SYNTHROID, LEVOTHROID) 125 MCG tablet Take 125 mcg by mouth daily before breakfast.   . potassium chloride SA (K-DUR,KLOR-CON) 20 MEQ tablet Take 40 mEq by mouth 2 (two) times daily with a meal.   . predniSONE (DELTASONE) 5 MG tablet Take 5 mg by mouth daily with breakfast.  . promethazine (PHENERGAN) 25 MG tablet Take 25 mg by mouth every 6 (six) hours as needed for nausea or vomiting.   . sodium bicarbonate 650 MG tablet Take 2 tablets (1,300 mg total) by mouth 2 (two) times daily. (Patient taking differently: Take 1,950 mg by mouth 4 (four) times daily -  before meals and at bedtime. )  . traMADol (ULTRAM) 50 MG tablet Take 1-2 tablets (50-100 mg total) by mouth every 6 (six) hours as needed for severe pain.  . nitroGLYCERIN (NITROSTAT) 0.4 MG SL tablet Place 1 tablet (0.4 mg total) under the tongue every 5 (five) minutes as  needed for chest pain.   No current facility-administered medications on file prior to visit.     Chief Complaint  Patient presents with  . Follow-up    Pt is having SOB and breathing in general worsen last month. Pt goes to restroom down the hall in middle of night and is SOB and cannot catch breath well to go back to sleep at night. Pt cannot walk far without getting out of breath, using rescue inhaler at times it helps on occassion.      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 female former smoker with  COPD/emphysema.  She has noticed more trouble with her breathing with activity.  She gets winded with minimal activity (walking to bathroom).  She is taking longer to recover.  She is not have cough or sputum.  She had some wheeze last week, but albuterol helped.  She felt better when using trelegy.  She didn't think she had a refill.  Only been using spiriva recently.  She has leg swelling that comes and goes.  She has appointment with renal later this month.  She switched to Dr. Michail Sermon with GI for Crohn's.  She was [facility].  She was told she would need to have surgery.  Ambulatory oximetry on room air today >> no  desaturation.  Physical exam  General - pleasant, thin Eyes - pupils reactive ENT - no sinus tenderness, no oral exudate, no LAN Cardiac - regular, no murmur Chest - decreased breath sounds, no wheeze, rales Abd - soft, non tender Ext - 1+ edema Skin - no rashes Neuro - normal strength Psych - normal mood  Assessment/plan  Severe COPD with emphysema. - she has progressive dyspnea, and this might be related to change in her inhaler regimen - will resume trelegy - continue albuterol - discussed roles of her different inhalers  Crohn's colitis. - she is followed at [facility] - advised that she is at high risk if she needs to have surgery, but can proceed with surgery if no other alternatives  Severe protein calorie malnutrition. - f/u with PCP  Stage IV CKD. - she is followed by Dr. Mercy Moore   Patient Instructions  Trelegy one puff daily, and rinse mouth after each use  Follow up in 6 months    Chesley Mires, MD [place] [facility] Pager:  [phone] 02/27/2017, 12:10 PM

## 2017-03-19 ENCOUNTER — Encounter (HOSPITAL_COMMUNITY): Payer: Self-pay | Admitting: *Deleted

## 2017-03-19 ENCOUNTER — Inpatient Hospital Stay (HOSPITAL_COMMUNITY)
Admission: EM | Admit: 2017-03-19 | Discharge: 2017-03-27 | DRG: 673 | Disposition: A | Discharge status: Skilled Nursing Facility | Payer: Medicare Other | Attending: Internal Medicine | Admitting: Internal Medicine

## 2017-03-19 DIAGNOSIS — N189 Chronic kidney disease, unspecified: Secondary | ICD-10-CM

## 2017-03-19 DIAGNOSIS — Z7952 Long term (current) use of systemic steroids: Secondary | ICD-10-CM

## 2017-03-19 DIAGNOSIS — Z90710 Acquired absence of both cervix and uterus: Secondary | ICD-10-CM | POA: Diagnosis not present

## 2017-03-19 DIAGNOSIS — J439 Emphysema, unspecified: Secondary | ICD-10-CM | POA: Diagnosis present

## 2017-03-19 DIAGNOSIS — Z79890 Hormone replacement therapy (postmenopausal): Secondary | ICD-10-CM | POA: Diagnosis not present

## 2017-03-19 DIAGNOSIS — D631 Anemia in chronic kidney disease: Secondary | ICD-10-CM | POA: Diagnosis present

## 2017-03-19 DIAGNOSIS — M810 Age-related osteoporosis without current pathological fracture: Secondary | ICD-10-CM | POA: Diagnosis present

## 2017-03-19 DIAGNOSIS — K5090 Crohn's disease, unspecified, without complications: Secondary | ICD-10-CM | POA: Diagnosis present

## 2017-03-19 DIAGNOSIS — K219 Gastro-esophageal reflux disease without esophagitis: Secondary | ICD-10-CM | POA: Diagnosis present

## 2017-03-19 DIAGNOSIS — D638 Anemia in other chronic diseases classified elsewhere: Secondary | ICD-10-CM

## 2017-03-19 DIAGNOSIS — N185 Chronic kidney disease, stage 5: Secondary | ICD-10-CM | POA: Diagnosis not present

## 2017-03-19 DIAGNOSIS — K5080 Crohn's disease of both small and large intestine without complications: Secondary | ICD-10-CM | POA: Diagnosis present

## 2017-03-19 DIAGNOSIS — Z79899 Other long term (current) drug therapy: Secondary | ICD-10-CM

## 2017-03-19 DIAGNOSIS — N2581 Secondary hyperparathyroidism of renal origin: Secondary | ICD-10-CM | POA: Diagnosis present

## 2017-03-19 DIAGNOSIS — N390 Urinary tract infection, site not specified: Secondary | ICD-10-CM | POA: Diagnosis present

## 2017-03-19 DIAGNOSIS — E43 Unspecified severe protein-calorie malnutrition: Secondary | ICD-10-CM | POA: Diagnosis present

## 2017-03-19 DIAGNOSIS — Z7983 Long term (current) use of bisphosphonates: Secondary | ICD-10-CM

## 2017-03-19 DIAGNOSIS — R569 Unspecified convulsions: Secondary | ICD-10-CM | POA: Diagnosis not present

## 2017-03-19 DIAGNOSIS — I132 Hypertensive heart and chronic kidney disease with heart failure and with stage 5 chronic kidney disease, or end stage renal disease: Secondary | ICD-10-CM | POA: Diagnosis present

## 2017-03-19 DIAGNOSIS — N186 End stage renal disease: Secondary | ICD-10-CM

## 2017-03-19 DIAGNOSIS — Z8349 Family history of other endocrine, nutritional and metabolic diseases: Secondary | ICD-10-CM

## 2017-03-19 DIAGNOSIS — E872 Acidosis, unspecified: Secondary | ICD-10-CM | POA: Diagnosis present

## 2017-03-19 DIAGNOSIS — Z86718 Personal history of other venous thrombosis and embolism: Secondary | ICD-10-CM | POA: Diagnosis not present

## 2017-03-19 DIAGNOSIS — E890 Postprocedural hypothyroidism: Secondary | ICD-10-CM | POA: Diagnosis present

## 2017-03-19 DIAGNOSIS — H409 Unspecified glaucoma: Secondary | ICD-10-CM | POA: Diagnosis present

## 2017-03-19 DIAGNOSIS — Z888 Allergy status to other drugs, medicaments and biological substances status: Secondary | ICD-10-CM

## 2017-03-19 DIAGNOSIS — Z833 Family history of diabetes mellitus: Secondary | ICD-10-CM

## 2017-03-19 DIAGNOSIS — R197 Diarrhea, unspecified: Secondary | ICD-10-CM | POA: Diagnosis not present

## 2017-03-19 DIAGNOSIS — N179 Acute kidney failure, unspecified: Principal | ICD-10-CM | POA: Diagnosis present

## 2017-03-19 DIAGNOSIS — Z8249 Family history of ischemic heart disease and other diseases of the circulatory system: Secondary | ICD-10-CM

## 2017-03-19 DIAGNOSIS — D649 Anemia, unspecified: Secondary | ICD-10-CM

## 2017-03-19 DIAGNOSIS — Z87891 Personal history of nicotine dependence: Secondary | ICD-10-CM

## 2017-03-19 DIAGNOSIS — E876 Hypokalemia: Secondary | ICD-10-CM | POA: Diagnosis not present

## 2017-03-19 DIAGNOSIS — M1990 Unspecified osteoarthritis, unspecified site: Secondary | ICD-10-CM | POA: Diagnosis present

## 2017-03-19 DIAGNOSIS — I739 Peripheral vascular disease, unspecified: Secondary | ICD-10-CM | POA: Diagnosis present

## 2017-03-19 DIAGNOSIS — E860 Dehydration: Secondary | ICD-10-CM | POA: Diagnosis not present

## 2017-03-19 DIAGNOSIS — Z681 Body mass index (BMI) 19 or less, adult: Secondary | ICD-10-CM

## 2017-03-19 DIAGNOSIS — K50119 Crohn's disease of large intestine with unspecified complications: Secondary | ICD-10-CM

## 2017-03-19 DIAGNOSIS — I5032 Chronic diastolic (congestive) heart failure: Secondary | ICD-10-CM | POA: Diagnosis present

## 2017-03-19 DIAGNOSIS — E162 Hypoglycemia, unspecified: Secondary | ICD-10-CM | POA: Diagnosis not present

## 2017-03-19 DIAGNOSIS — R1011 Right upper quadrant pain: Secondary | ICD-10-CM | POA: Diagnosis present

## 2017-03-19 DIAGNOSIS — R112 Nausea with vomiting, unspecified: Secondary | ICD-10-CM | POA: Diagnosis not present

## 2017-03-19 DIAGNOSIS — R1084 Generalized abdominal pain: Secondary | ICD-10-CM

## 2017-03-19 DIAGNOSIS — L509 Urticaria, unspecified: Secondary | ICD-10-CM | POA: Diagnosis not present

## 2017-03-19 DIAGNOSIS — Z825 Family history of asthma and other chronic lower respiratory diseases: Secondary | ICD-10-CM

## 2017-03-19 DIAGNOSIS — I472 Ventricular tachycardia: Secondary | ICD-10-CM | POA: Diagnosis not present

## 2017-03-19 DIAGNOSIS — J449 Chronic obstructive pulmonary disease, unspecified: Secondary | ICD-10-CM | POA: Diagnosis not present

## 2017-03-19 DIAGNOSIS — Z87442 Personal history of urinary calculi: Secondary | ICD-10-CM | POA: Diagnosis not present

## 2017-03-19 DIAGNOSIS — Z800 Family history of malignant neoplasm of digestive organs: Secondary | ICD-10-CM

## 2017-03-19 LAB — LIPASE, BLOOD: LIPASE: 172 U/L — AB (ref 11–51)

## 2017-03-19 LAB — COMPREHENSIVE METABOLIC PANEL
ALT: 13 U/L — AB (ref 14–54)
AST: 20 U/L (ref 15–41)
Albumin: 3.1 g/dL — ABNORMAL LOW (ref 3.5–5.0)
Alkaline Phosphatase: 115 U/L (ref 38–126)
Anion gap: 14 (ref 5–15)
BUN: 68 mg/dL — ABNORMAL HIGH (ref 6–20)
CHLORIDE: 118 mmol/L — AB (ref 101–111)
CO2: 9 mmol/L — AB (ref 22–32)
CREATININE: 8.46 mg/dL — AB (ref 0.44–1.00)
Calcium: 5.4 mg/dL — CL (ref 8.9–10.3)
GFR calc Af Amer: 5 mL/min — ABNORMAL LOW (ref >60)
GFR calc non Af Amer: 4 mL/min — ABNORMAL LOW (ref >60)
Glucose, Bld: 97 mg/dL (ref 65–99)
Potassium: 2.4 mmol/L — CL (ref 3.5–5.1)
Sodium: 141 mmol/L (ref 135–145)
Total Bilirubin: 0.6 mg/dL (ref 0.3–1.2)
Total Protein: 6.2 g/dL — ABNORMAL LOW (ref 6.5–8.1)

## 2017-03-19 LAB — CBC
HCT: 27.1 % — ABNORMAL LOW (ref 36.0–46.0)
Hemoglobin: 8.7 g/dL — ABNORMAL LOW (ref 12.0–15.0)
MCH: 30.4 pg (ref 26.0–34.0)
MCHC: 32.1 g/dL (ref 30.0–36.0)
MCV: 94.8 fL (ref 78.0–100.0)
PLATELETS: 153 10*3/uL (ref 150–400)
RBC: 2.86 MIL/uL — ABNORMAL LOW (ref 3.87–5.11)
RDW: 16.5 % — ABNORMAL HIGH (ref 11.5–15.5)
WBC: 8.7 10*3/uL (ref 4.0–10.5)

## 2017-03-19 LAB — URINALYSIS, ROUTINE W REFLEX MICROSCOPIC
Bilirubin Urine: NEGATIVE
Glucose, UA: NEGATIVE mg/dL
Ketones, ur: NEGATIVE mg/dL
Leukocytes, UA: NEGATIVE
Nitrite: NEGATIVE
PH: 5 (ref 5.0–8.0)
Protein, ur: 30 mg/dL — AB
SPECIFIC GRAVITY, URINE: 1.006 (ref 1.005–1.030)

## 2017-03-19 LAB — I-STAT CG4 LACTIC ACID, ED: Lactic Acid, Venous: 0.79 mmol/L (ref 0.5–1.9)

## 2017-03-19 MED ORDER — OXYCODONE HCL 5 MG PO TABS
5.0000 mg | ORAL_TABLET | Freq: Once | ORAL | Status: AC
  Administered 2017-03-19: 5 mg via ORAL
  Filled 2017-03-19: qty 1

## 2017-03-19 MED ORDER — POTASSIUM CHLORIDE IN NACL 20-0.9 MEQ/L-% IV SOLN
Freq: Once | INTRAVENOUS | Status: AC
  Administered 2017-03-19: 19:00:00 via INTRAVENOUS
  Filled 2017-03-19: qty 1000

## 2017-03-19 MED ORDER — SODIUM CHLORIDE 0.9 % IV SOLN
1.0000 g | Freq: Once | INTRAVENOUS | Status: AC
  Administered 2017-03-19: 1 g via INTRAVENOUS
  Filled 2017-03-19: qty 10

## 2017-03-19 MED ORDER — IOPAMIDOL (ISOVUE-300) INJECTION 61%
INTRAVENOUS | Status: AC
  Filled 2017-03-19: qty 30

## 2017-03-19 MED ORDER — OXYCODONE HCL 5 MG PO TABS
5.0000 mg | ORAL_TABLET | ORAL | Status: DC | PRN
  Administered 2017-03-20 (×2): 5 mg via ORAL
  Filled 2017-03-19 (×2): qty 1

## 2017-03-19 NOTE — ED Triage Notes (Signed)
To ED for eval of abd pain, nausea, and diarrhea since Monday. States the pain has just 'gotten worse'. Appears in nad

## 2017-03-19 NOTE — H&P (Signed)
History and Physical    [name]:[id number] DOB: [birth year] DOA: 03/19/2017  PCP: [name], MD  Patient coming from:  home  Chief Complaint:  N/v/d abdominal pain  HPI: Natasha [name] is a 71 y.o. female with medical history significant of crohns on humara with recent fistula formation sent to [facility] by dr schooler, ckd not on dialysis yet, PVD comes in with one week of worsening n/v/d and generalized abdominal pain consistent with her routine crohns flare.  No fevers.  She has been feeling awful.  Pt primary gi doc is dr Soil scientist and she was recently set up to go to [facility] because she has developed complications despite being on humara for 2 years.  Pt also has nondialysis CKD with baseline cr of around 4.  It is now at 8 with k level of 2.4.  Pt very dry.  Pt referred for admission for her AKI and probable crohns flare.  No imaging of abdomen done yet and is pending.  Review of Systems: As per HPI otherwise 10 point review of systems negative.   Past Medical History:  Diagnosis Date  . Anal fistula   . Anemia in chronic kidney disease (CKD)   . Aortic insufficiency    Echo 3/18: mod conc LVH, EF 60-65, no RWMA, Gr 1 DD, mod AI, mild MR, normal RVSF, Trivial TR  . Arthritis   . Borderline hypertension   . Bulging disc    L3-L4  . Chronic diarrhea    due to crohn's  . CKD (chronic kidney disease), stage IV ([place])    NEPHROLOGIST--  DR MATTINGLY  . Crohn's disease ([place])    chronic ileitis  . Dyspnea    with exertion  . Emphysema/COPD ([place])   . GERD (gastroesophageal reflux disease)    denies  . History of glaucoma   . History of kidney stones   . History of pancreatitis    2008--  mercaptopurine  . History of small bowel obstruction    12-03-2010  due to crohn's ileitis  . Hypertension    no longer on medications  . Hypothyroidism, postsurgical    multinodule w/ hurthle cells  . Nephrolithiasis    bilateral  . Osteoporosis   . Perianal Crohn's  disease ([place])   . Peripheral vascular disease ([place])    blood clot behind knee left leg  . Polyarthralgia   . Wears partial dentures    upper    Past Surgical History:  Procedure Laterality Date  . ABDOMINAL HYSTERECTOMY  1990   and  Appendectomy  . BASCILIC VEIN TRANSPOSITION Left 01/09/2017   Procedure: LEFT 1ST STAGE BRACHIAL VEIN TRANSPOSITION;  Surgeon: Conrad Baywood, MD;  Location: [place];  Service: Vascular;  Laterality: Left;  . COLON RESECTION  x3 --  1978,  1987, [place] x2/   [place]  . COLONOSCOPY WITH PROPOFOL N/A 09/25/2014   Procedure: COLONOSCOPY WITH PROPOFOL;  Surgeon: Garlan Fair, MD;  Location: [facility];  Service: Endoscopy;  Laterality: N/A;  . CYSTOSCOPY W/ URETERAL STENT PLACEMENT Right 11/19/2016   Procedure: CYSTOSCOPY WITH RIGHT RETROGRADE PYELOGRAM/ URETEROSCOPY WITH LASER AND RIGHT URETERAL STENT PLACEMENT;  Surgeon: Ardis Hughs, MD;  Location: [facility];  Service: Urology;  Laterality: Right;  . ESOPHAGOGASTRODUODENOSCOPY  03/04/2012   Procedure: ESOPHAGOGASTRODUODENOSCOPY (EGD);  Surgeon: Garlan Fair, MD;  Location: [facility];  Service: Endoscopy;  Laterality: N/A;  . EXAMINATION UNDER ANESTHESIA N/A 09/12/2014   Procedure:  EXAM UNDER ANESTHESIA;  Surgeon: Rolm Bookbinder, MD;  Location: [place];  Service: General;  Laterality: N/A;  . FLEXIBLE SIGMOIDOSCOPY  03/04/2012   Procedure: FLEXIBLE SIGMOIDOSCOPY;  Surgeon: Garlan Fair, MD;  Location: [facility];  Service: Endoscopy;  Laterality: N/A;  . GLAUCOMA SURGERY Bilateral   . HOLMIUM LASER APPLICATION Right 2/72/5366   Procedure: HOLMIUM LASER APPLICATION;  Surgeon: Ardis Hughs, MD;  Location: [facility];  Service: Urology;  Laterality: Right;  . INCISION AND DRAINAGE PERIRECTAL ABSCESS N/A 09/12/2014   Procedure: IRRIGATION AND DEBRIDEMENT PERIRECTAL ABSCESS;  Surgeon: Rolm Bookbinder, MD;  Location: [place];  Service: General;  Laterality: N/A;  .  NEGATIVE SLEEP STUDY  2008  . PLACEMENT OF SETON N/A 12/01/2014   Procedure: PLACEMENT OF SETON;  Surgeon: [name], MD;  Location: [facility];  Service: General;  Laterality: N/A;  . RECTAL EXAM UNDER ANESTHESIA N/A 12/01/2014   Procedure: RECTAL EXAM UNDER ANESTHESIA;  Surgeon: [name], MD;  Location: [name];  Service: General;  Laterality: N/A;  . TOTAL THYROIDECTOMY  10-13-2003  . TRANSTHORACIC ECHOCARDIOGRAM  07-11-2013   mild LVH,  ef 55%,  moderate AR,  mild MR and TR,  trivial PR     reports that she quit smoking about 15 years ago. Her smoking use included Cigarettes. She has a 3.75 pack-year smoking history. She has never used smokeless tobacco. She reports that she does not drink alcohol or use drugs.  Allergies  Allergen Reactions  . Mercaptopurine Other (See Comments)    Caused pancreatitis  . Remicade [Infliximab] Other (See Comments)    CAUSED JOINT PAIN    Family History  Problem Relation Age of Onset  . Hypertension Father   . Heart disease Father   . Heart attack Father   . Diverticulitis Mother   . COPD Mother   . Hypertension Mother   . Heart disease Mother   . Heart attack Mother   . Heart attack Brother   . Colon cancer Brother   . Diabetes Brother   . Thyroid disease Brother     Prior to Admission medications   Medication Sig Start Date End Date Taking? Authorizing Provider  acetaminophen (TYLENOL) 325 MG tablet Take 325 mg by mouth every 6 (six) hours as needed for mild pain.   Yes [provider]  Adalimumab (HUMIRA) 40 MG/0.8ML PSKT Inject 40 mg into the skin every 14 (fourteen) days.   Yes [provider]  albuterol (PROVENTIL HFA;VENTOLIN HFA) 108 (90 BASE) MCG/ACT inhaler Inhale 2 puffs into the lungs every 6 (six) hours as needed for wheezing or shortness of breath.   Yes [provider]  alendronate (FOSAMAX) 70 MG tablet Take 70 mg by mouth once a week. Mondays 11/06/16  Yes  [provider]  calcitRIOL (ROCALTROL) 0.25 MCG capsule Take 0.5 mcg by mouth daily.   Yes [provider]  calcium-vitamin D (OSCAL WITH D) 500-200 MG-UNIT tablet Take 1 tablet by mouth 2 (two) times daily.   Yes [provider]  Cholecalciferol (VITAMIN D3) 2000 UNITS TABS Take 2,000 Units by mouth 2 (two) times daily.  04/07/14  Yes [provider]  cyanocobalamin (,VITAMIN B-12,) 1000 MCG/ML injection Inject 1,000 mcg into the muscle See admin instructions. Every 60 days   Yes [provider]  Fluticasone-Umeclidin-Vilant (TRELEGY ELLIPTA) 100-62.5-25 MCG/INH AEPB Inhale 1 puff into the lungs daily. 02/27/17  Yes Chesley Mires, MD  folic acid (FOLVITE) 1 MG tablet  Take 1 mg by mouth daily.   Yes [provider]  furosemide (LASIX) 20 MG tablet Take 20 mg by mouth 2 (two) times daily with a meal.   Yes [provider]  levothyroxine (SYNTHROID, LEVOTHROID) 125 MCG tablet Take 125 mcg by mouth daily before breakfast.    Yes [provider]  nitroGLYCERIN (NITROSTAT) 0.4 MG SL tablet Place 1 tablet (0.4 mg total) under the tongue every 5 (five) minutes as needed for chest pain. 08/19/16 03/19/17 Yes Weaver, Scott T, PA-C  potassium chloride SA (K-DUR,KLOR-CON) 20 MEQ tablet Take 40 mEq by mouth 2 (two) times daily with a meal.  09/24/15  Yes [provider]  predniSONE (DELTASONE) 5 MG tablet Take 5 mg by mouth daily with breakfast.   Yes [provider]  promethazine (PHENERGAN) 25 MG tablet Take 25 mg by mouth every 6 (six) hours as needed for nausea or vomiting.  04/19/14  Yes [provider]  sodium bicarbonate 650 MG tablet Take 2 tablets (1,300 mg total) by mouth 2 (two) times daily. Patient taking differently: Take 1,950 mg by mouth 2 (two) times daily.  11/21/16  Yes Mariel Aloe, MD  traMADol (ULTRAM) 50 MG tablet Take 1-2 tablets (50-100 mg total) by mouth every 6 (six) hours as needed for  severe pain. 01/09/17  Yes Ulyses Amor, [place]    Physical Exam: Vitals:   03/19/17 1830 03/19/17 1900 03/19/17 1915 03/19/17 2015  BP: 113/61 (!) 105/58 108/67 (!) 107/59  Pulse: 98 95 (!) 102 95  Resp: 18 16 17 13   Temp:      TempSrc:      SpO2: 100% 100% 100% 100%   Constitutional: NAD, calm, comfortable, malnourished appearing Vitals:   03/19/17 1830 03/19/17 1900 03/19/17 1915 03/19/17 2015  BP: 113/61 (!) 105/58 108/67 (!) 107/59  Pulse: 98 95 (!) 102 95  Resp: 18 16 17 13   Temp:      TempSrc:      SpO2: 100% 100% 100% 100%   Eyes: PERRL, lids and conjunctivae normal ENMT: Mucous membranes are moist. Posterior pharynx clear of any exudate or lesions.Normal dentition.  Neck: normal, supple, no masses, no thyromegaly Respiratory: clear to auscultation bilaterally, no wheezing, no crackles. Normal respiratory effort. No accessory muscle use.  Cardiovascular: Regular rate and rhythm, no murmurs / rubs / gallops. No extremity edema. 2+ pedal pulses. No carotid bruits.  Abdomen: no tenderness, no masses palpated. No hepatosplenomegaly. Bowel sounds positive.  Musculoskeletal: no clubbing / cyanosis. No joint deformity upper and lower extremities. Good ROM, no contractures. Normal muscle tone.  Skin: no rashes, lesions, ulcers. No induration Neurologic: CN 2-12 grossly intact. Sensation intact, DTR normal. Strength 5/5 in all 4.  Psychiatric: Normal judgment and insight. Alert and oriented x 3. Normal mood.    Labs on Admission: I have personally reviewed following labs and imaging studies  CBC:  Recent Labs Lab 03/19/17 1453  WBC 8.7  HGB 8.7*  HCT 27.1*  MCV 94.8  PLT 742   Basic Metabolic Panel:  Recent Labs Lab 03/19/17 1453  NA 141  K 2.4*  CL 118*  CO2 9*  GLUCOSE 97  BUN 68*  CREATININE 8.46*  CALCIUM 5.4*   GFR: CrCl cannot be calculated (Unknown ideal weight.). Liver Function Tests:  Recent Labs Lab 03/19/17 1453  AST 20  ALT 13*    ALKPHOS 115  BILITOT 0.6  PROT 6.2*  ALBUMIN 3.1*    Recent Labs Lab 03/19/17  1453  LIPASE 172*   BNP (last 3 results)  Recent Labs  07/30/16 1156  PROBNP 272   Urine analysis:    Component Value Date/Time   COLORURINE YELLOW 03/19/2017 1934   APPEARANCEUR HAZY (A) 03/19/2017 1934   LABSPEC 1.006 03/19/2017 1934   PHURINE 5.0 03/19/2017 1934   GLUCOSEU NEGATIVE 03/19/2017 1934   HGBUR MODERATE (A) 03/19/2017 1934   BILIRUBINUR NEGATIVE 03/19/2017 [place] 03/19/2017 1934   PROTEINUR 30 (A) 03/19/2017 1934   UROBILINOGEN 0.2 09/09/2014 0850   NITRITE NEGATIVE 03/19/2017 1934   LEUKOCYTESUR NEGATIVE 03/19/2017 1934    Radiological Exams on Admission: No results found.  EKG: Independently reviewed.  nsr no acute issues Old chart reviewed Case discussed with edp  Assessment/Plan 71 yo female with likely crohns flare comes in with one week of n/v/d/abdominal pain with dehydration/aki  Principal Problem:   Acute on chronic renal failure ([place])-  Ivf.  Not uremic.  Replace gi losses.  Repeat bmp in am.  If not improving consider renal consult. No need for dialysis emergently tonight.  Active Problems:   Hypokalemia- replete orally and with ivf.  Ck mag level also and replete if necessary   Hypocalcemia- repleting in ed.  Repeat level in am.   Nausea vomiting and diarrhea- prn zofran.  Ck ct abdomen to assess for any complicatons   Chronic diastolic CHF (congestive heart failure) ([place])- stable at this time   Dehydration- ivf   Crohn's disease without complication ([place])- ck ct abd/pelvis.  Place on iv solumedrol, iv flagyl and levaquin also   Metabolic acidosis- ivf and treat infection   Anemia of chronic disease- stable   Generalized abdominal pain- prn dilaudid ordered. s teroids and abx   COPD (chronic obstructive pulmonary disease) ([place])- stable    DVT prophylaxis:  scds Code Status:  full Family Communication:  none Disposition Plan:  Per  day team Consults called:   none Admission status:   admission  Maizee Reinhold A MD [facility]  If 7PM-7AM, please contact night-coverage [email or web address] Password [email or web address]  03/19/2017, 11:10 PM

## 2017-03-19 NOTE — ED Provider Notes (Signed)
[facility] EMERGENCY DEPARTMENT Provider Note   CSN: [id number] Arrival date & time: 03/19/17  1422  History   Chief Complaint Chief Complaint  Patient presents with  . Abdominal Pain   HPI Natasha [name] is a 71 y.o. female.  The history is provided by the patient.  Abdominal Pain   This is a new problem. The current episode started more than 2 days ago. The problem occurs constantly. The problem has been gradually worsening. The pain is located in the RUQ, RLQ and generalized abdominal region. The pain is moderate. Associated symptoms include anorexia, fever, diarrhea, nausea and vomiting. Pertinent negatives include dysuria, hematuria and arthralgias. Nothing aggravates the symptoms. Nothing relieves the symptoms. Her past medical history is significant for Crohn's disease.   Past Medical History:  Diagnosis Date  . Anal fistula   . Anemia in chronic kidney disease (CKD)   . Aortic insufficiency    Echo 3/18: mod conc LVH, EF 60-65, no RWMA, Gr 1 DD, mod AI, mild MR, normal RVSF, Trivial TR  . Arthritis   . Borderline hypertension   . Bulging disc    L3-L4  . Chronic diarrhea    due to crohn's  . CKD (chronic kidney disease), stage IV ([place])    NEPHROLOGIST--  DR MATTINGLY  . Crohn's disease ([place])    chronic ileitis  . Dyspnea    with exertion  . Emphysema/COPD ([place])   . GERD (gastroesophageal reflux disease)    denies  . History of glaucoma   . History of kidney stones   . History of pancreatitis    2008--  mercaptopurine  . History of small bowel obstruction    12-03-2010  due to crohn's ileitis  . Hypertension    no longer on medications  . Hypothyroidism, postsurgical    multinodule w/ hurthle cells  . Nephrolithiasis    bilateral  . Osteoporosis   . Perianal Crohn's disease ([place])   . Peripheral vascular disease ([place])    blood clot behind knee left leg  . Polyarthralgia   . Wears partial dentures    upper   Patient Active Problem  List   Diagnosis Date Noted  . Aortic insufficiency 07/30/2016  . Atherosclerosis of aorta ([place]) 07/30/2016  . Pressure injury of skin 07/14/2016  . Gastroenteritis, acute 07/12/2016  . COPD (chronic obstructive pulmonary disease) ([place]) 10/12/2015  . Malnutrition of moderate degree ([place]) 09/13/2014  . history of Left leg DVT 09/10/2014  . Hypotension 09/10/2014  . Hypoglycemia 09/10/2014  . Ischiorectal abscess 09/09/2014  . Increased anion gap metabolic acidosis   . Generalized abdominal pain   . Chronic diastolic CHF (congestive heart failure) ([place])   . Hypertensive heart disease   . Acute on chronic renal failure ([place])   . Secondary hyperparathyroidism ([place])   . Other specified hypothyroidism   . Right shoulder pain   . Thrombocytopenia ([place])   . Severe protein-calorie malnutrition ([place])   . Thyroid activity decreased   . Emphysema of lung ([place])   . Nephrolithiasis 03/29/2014  . AKI (acute kidney injury) ([place]) 03/29/2014  . Spinal stenosis of lumbar region 11/01/2013  . Bilateral leg pain 09/28/2013  . Emphysema/COPD ([place])   . Dyspnea 09/20/2013  . Protein-calorie malnutrition, severe ([place]) 05/14/2013  . Salmonella enteritidis 06/11/2012  . Hypocalcemia 06/10/2012  . Hypothyroidism 06/10/2012  . Anemia of chronic disease 06/09/2012  . Hypomagnesemia 06/09/2012  . CKD (chronic kidney disease) stage 4, GFR 15-29 ml/min (H[facility]) 06/09/2012  .  Acute renal failure ([place]) 06/07/2012  . Dehydration 06/07/2012  . Crohn's disease without complication ([place]) 24/46/2863  . Metabolic acidosis 81/77/1165  . Hypokalemia 06/07/2012  . Diarrhea 06/07/2012  . UTI (lower urinary tract infection) 06/07/2012   Past Surgical History:  Procedure Laterality Date  . ABDOMINAL HYSTERECTOMY  1990   and  Appendectomy  . BASCILIC VEIN TRANSPOSITION Left 01/09/2017   Procedure: LEFT 1ST STAGE BRACHIAL VEIN TRANSPOSITION;  Surgeon: Conrad Darbydale, MD;  Location: [facility];  Service: Vascular;   Laterality: Left;  . COLON RESECTION  x3 --  1978,  1987, [place] x2/   [place]  . COLONOSCOPY WITH PROPOFOL N/A 09/25/2014   Procedure: COLONOSCOPY WITH PROPOFOL;  Surgeon: Garlan Fair, MD;  Location: [facility];  Service: Endoscopy;  Laterality: N/A;  . CYSTOSCOPY W/ URETERAL STENT PLACEMENT Right 11/19/2016   Procedure: CYSTOSCOPY WITH RIGHT RETROGRADE PYELOGRAM/ URETEROSCOPY WITH LASER AND RIGHT URETERAL STENT PLACEMENT;  Surgeon: Ardis Hughs, MD;  Location: [facility];  Service: Urology;  Laterality: Right;  . ESOPHAGOGASTRODUODENOSCOPY  03/04/2012   Procedure: ESOPHAGOGASTRODUODENOSCOPY (EGD);  Surgeon: Garlan Fair, MD;  Location: [facility];  Service: Endoscopy;  Laterality: N/A;  . EXAMINATION UNDER ANESTHESIA N/A 09/12/2014   Procedure: EXAM UNDER ANESTHESIA;  Surgeon: Rolm Bookbinder, MD;  Location: [facility];  Service: General;  Laterality: N/A;  . FLEXIBLE SIGMOIDOSCOPY  03/04/2012   Procedure: FLEXIBLE SIGMOIDOSCOPY;  Surgeon: Garlan Fair, MD;  Location: [facility];  Service: Endoscopy;  Laterality: N/A;  . GLAUCOMA SURGERY Bilateral   . HOLMIUM LASER APPLICATION Right 7/90/3833   Procedure: HOLMIUM LASER APPLICATION;  Surgeon: Ardis Hughs, MD;  Location: [facility];  Service: Urology;  Laterality: Right;  . INCISION AND DRAINAGE PERIRECTAL ABSCESS N/A 09/12/2014   Procedure: IRRIGATION AND DEBRIDEMENT PERIRECTAL ABSCESS;  Surgeon: Rolm Bookbinder, MD;  Location: [facility];  Service: General;  Laterality: N/A;  . NEGATIVE SLEEP STUDY  2008  . PLACEMENT OF SETON N/A 12/01/2014   Procedure: PLACEMENT OF SETON;  Surgeon: [name], MD;  Location: [facility];  Service: General;  Laterality: N/A;  . RECTAL EXAM UNDER ANESTHESIA N/A 12/01/2014   Procedure: RECTAL EXAM UNDER ANESTHESIA;  Surgeon: [name], MD;  Location: [facility];  Service: General;  Laterality: N/A;  . TOTAL THYROIDECTOMY  10-13-2003    . TRANSTHORACIC ECHOCARDIOGRAM  07-11-2013   mild LVH,  ef 55%,  moderate AR,  mild MR and TR,  trivial PR   OB History    No data available     Home Medications    Prior to Admission medications   Medication Sig Start Date End Date Taking? Authorizing Provider  acetaminophen (TYLENOL) 325 MG tablet Take 325 mg by mouth every 6 (six) hours as needed for mild pain.    [provider]  Adalimumab (HUMIRA) 40 MG/0.8ML PSKT Inject 40 mg into the skin every 14 (fourteen) days.    [provider]  albuterol (PROVENTIL HFA;VENTOLIN HFA) 108 (90 BASE) MCG/ACT inhaler Inhale 2 puffs into the lungs every 6 (six) hours as needed for wheezing or shortness of breath.    [provider]  alendronate (FOSAMAX) 70 MG tablet Take 70 mg by mouth once a week. Mondays 11/06/16   [provider]  calcitRIOL (ROCALTROL) 0.25 MCG capsule Take 0.5 mcg by mouth daily.    [provider]  calcium-vitamin D (OSCAL WITH D) 500-200 MG-UNIT tablet Take 1 tablet by  mouth 2 (two) times daily.    [provider]  Cholecalciferol (VITAMIN D3) 2000 UNITS TABS Take 2,000 Units by mouth 2 (two) times daily.  04/07/14   [provider]  cyanocobalamin (,VITAMIN B-12,) 1000 MCG/ML injection Inject 1,000 mcg into the muscle See admin instructions. Every 60 days    [provider]  Fluticasone-Umeclidin-Vilant (TRELEGY ELLIPTA) 100-62.5-25 MCG/INH AEPB Inhale 1 puff into the lungs daily. 02/27/17   Chesley Mires, MD  folic acid (FOLVITE) 1 MG tablet Take 1 mg by mouth daily.    [provider]  furosemide (LASIX) 20 MG tablet Take 20 mg by mouth 2 (two) times daily with a meal.    [provider]  levothyroxine (SYNTHROID, LEVOTHROID) 125 MCG tablet Take 125 mcg by mouth daily before breakfast.     [provider]  nitroGLYCERIN (NITROSTAT) 0.4 MG SL tablet Place 1 tablet (0.4 mg total) under the tongue every 5 (five) minutes as needed  for chest pain. 08/19/16 01/08/17  Richardson Dopp T, PA-C  potassium chloride SA (K-DUR,KLOR-CON) 20 MEQ tablet Take 40 mEq by mouth 2 (two) times daily with a meal.  09/24/15   [provider]  predniSONE (DELTASONE) 5 MG tablet Take 5 mg by mouth daily with breakfast.    [provider]  promethazine (PHENERGAN) 25 MG tablet Take 25 mg by mouth every 6 (six) hours as needed for nausea or vomiting.  04/19/14   [provider]  sodium bicarbonate 650 MG tablet Take 2 tablets (1,300 mg total) by mouth 2 (two) times daily. Patient taking differently: Take 1,950 mg by mouth 4 (four) times daily -  before meals and at bedtime.  11/21/16   Mariel Aloe, MD  traMADol (ULTRAM) 50 MG tablet Take 1-2 tablets (50-100 mg total) by mouth every 6 (six) hours as needed for severe pain. 01/09/17   Ulyses Amor, PA-C   Family History Family History  Problem Relation Age of Onset  . Hypertension Father   . Heart disease Father   . Heart attack Father   . Diverticulitis Mother   . COPD Mother   . Hypertension Mother   . Heart disease Mother   . Heart attack Mother   . Heart attack Brother   . Colon cancer Brother   . Diabetes Brother   . Thyroid disease Brother    Social History Social History  Substance Use Topics  . Smoking status: Former Smoker    Packs/day: 0.25    Years: 15.00    Types: Cigarettes    Quit date: 11/27/2001  . Smokeless tobacco: Never Used  . Alcohol use No    Allergies   Mercaptopurine and Remicade [infliximab]  Review of Systems Review of Systems  Constitutional: Positive for chills, fatigue and fever.  HENT: Negative for ear pain and sore throat.   Eyes: Negative for pain and visual disturbance.  Respiratory: Negative for cough and shortness of breath.   Cardiovascular: Negative for chest pain and palpitations.  Gastrointestinal: Positive for abdominal pain, anorexia, diarrhea, nausea and vomiting.  Genitourinary: Negative for dysuria and  hematuria.  Musculoskeletal: Negative for arthralgias and back pain.  Skin: Negative for color change and rash.  Neurological: Positive for weakness. Negative for seizures and syncope.  All other systems reviewed and are negative.  Physical Exam Updated Vital Signs BP 113/65   Pulse 93   Temp 98.7 F (37.1 C) (Oral)   Resp 20   SpO2 100%   Physical Exam  Constitutional: She is oriented to person, place, and time. She appears well-developed and well-nourished. No distress.  HENT:  Head: Normocephalic and atraumatic.  Eyes: Conjunctivae are normal.  Neck: Neck supple.  Cardiovascular: Normal rate and regular rhythm.   No murmur heard. Pulmonary/Chest: Effort normal and breath sounds normal. No respiratory distress.  Abdominal: Soft. Bowel sounds are normal. She exhibits no distension. There is tenderness. There is guarding.  Musculoskeletal: She exhibits no edema.  Neurological: She is alert and oriented to person, place, and time.  Skin: Skin is warm and dry.  Psychiatric: She has a normal mood and affect.  Nursing note and vitals reviewed.  ED Treatments / Results  Labs (all labs ordered are listed, but only abnormal results are displayed) Labs Reviewed  LIPASE, BLOOD - Abnormal; Notable for the following:       Result Value   Lipase 172 (*)    All other components within normal limits  COMPREHENSIVE METABOLIC PANEL - Abnormal; Notable for the following:    Potassium 2.4 (*)    Chloride 118 (*)    CO2 9 (*)    BUN 68 (*)    Creatinine, Ser 8.46 (*)    Calcium 5.4 (*)    Total Protein 6.2 (*)    Albumin 3.1 (*)    ALT 13 (*)    GFR calc non Af Amer 4 (*)    GFR calc Af Amer 5 (*)    All other components within normal limits  CBC - Abnormal; Notable for the following:    RBC 2.86 (*)    Hemoglobin 8.7 (*)    HCT 27.1 (*)    RDW 16.5 (*)    All other components within normal limits  URINE CULTURE  CULTURE, BLOOD (ROUTINE X 2)  CULTURE, BLOOD (ROUTINE X 2)    URINALYSIS, ROUTINE W REFLEX MICROSCOPIC  I-STAT CG4 LACTIC ACID, ED   EKG  EKG Interpretation None      Radiology Ct Abdomen Pelvis Wo Contrast  Result Date: 03/20/2017 CLINICAL DATA:  Acute onset of generalized abdominal pain, nausea and diarrhea. Initial encounter. EXAM: CT ABDOMEN AND PELVIS WITHOUT CONTRAST TECHNIQUE: Multidetector CT imaging of the abdomen and pelvis was performed following the standard protocol without IV contrast. COMPARISON:  CT of the abdomen and pelvis performed 11/18/2016 FINDINGS: Lower chest: Minimal right basilar atelectasis is noted. The visualized portions of the mediastinum are unremarkable. Hepatobiliary: The liver is unremarkable in appearance. The patient is status post cholecystectomy, with clips noted at the gallbladder fossa. The common bile duct remains normal in caliber. Pancreas: The pancreas is within normal limits. Spleen: The spleen is unremarkable in appearance. Adrenals/Urinary Tract: The adrenal glands are unremarkable in appearance. Nonobstructing bilateral renal stones measure up to 7 mm in size. There is no evidence of hydronephrosis. No obstructing ureteral stones are seen. A right renal cyst is noted. No significant perinephric stranding is appreciated. Stomach/Bowel: The patient's ileocolic anastomosis is unremarkable in appearance. The remaining colon is within normal limits. Contrast has progressed to the transverse and descending colon. The small bowel is grossly unremarkable. Mild soft tissue inflammation is noted about the gastric fundus, raising question for mild gastritis. Vascular/Lymphatic: Scattered calcification is seen along the abdominal aorta and its branches. The abdominal aorta is otherwise grossly unremarkable. The inferior vena cava is grossly unremarkable. No retroperitoneal lymphadenopathy is seen. No pelvic sidewall lymphadenopathy is identified. Reproductive: The bladder is moderately distended and grossly unremarkable. The  patient is status post hysterectomy. No  suspicious adnexal masses are seen. Other: No additional soft tissue abnormalities are seen. Musculoskeletal: No acute osseous abnormalities are identified. The visualized musculature is unremarkable in appearance. IMPRESSION: 1. Mild soft tissue inflammation about the gastric fundus, raising question for mild gastritis. Would correlate with the patient's symptoms. 2. Scattered aortic atherosclerosis. 3. Nonobstructing bilateral renal stones measure up to 7 mm in size. Right renal cysts noted. Electronically Signed   By: Garald Balding M.D.   On: 03/20/2017 03:13    Procedures Procedures (including critical care time)  Medications Ordered in ED Medications  0.9 % NaCl with KCl 20 mEq/ L  infusion (not administered)  calcium gluconate 1 g in sodium chloride 0.9 % 100 mL IVPB (not administered)   Initial Impression / Assessment and Plan / ED Course  I have reviewed the triage vital signs and the nursing notes.  Pertinent labs & imaging results that were available during my care of the patient were reviewed by me and considered in my medical decision making (see chart for details).  KAELY HOLLAN is a 71 y.o. female with PMH of Crohn's disease who presents with generalized abdominal pain, nausea, vomiting, and diarrhea concerning for Crohn's flair.  Labs studies revealed: CMP hypokalemia K, hypocalcemia Ca, ARF with creating of  Up from patient's baseline of 4.   CBC without leukocytosis, anemia Hgb 8.7 slight decrease from patient baseline of 10, normal platelets. Lactic acid 0.79  Patient treated with IVF and calcium gluconate, and pain medication.  CT abdomen/pelvis ordered at request of admitted team.  Other causes for the patient's symptom that were consider however at this time consider unlikely were appendicitis, Bowel Obstruction, AAA, UTI, Pyelonephritis, Nephrolithiasis, Cholecystitis, Shingles, Perforated Bowel or Ulcer, Diverticulosis/itis,  Ischemic Mesentery, Strangulated/Incarcerated Hernia,  as this would be inconsistent with history and physical.  Discussed this patient's care with hospitalist for admission.  The plan for this patient was discussed with Dr. Sabra Heck, who voiced agreement and who oversaw evaluation and treatment of this patient.   Final Clinical Impressions(s) / ED Diagnoses   Final diagnoses:  Crohn's disease of colon with complication ([place])  Generalized abdominal pain  Nausea vomiting and diarrhea  AKI (acute kidney injury) ([place])  Anemia, unspecified type   New Prescriptions New Prescriptions   No medications on file     Fenton Foy, MD 03/20/17 2244    Noemi [place], MD 03/22/17 [phone]

## 2017-03-19 NOTE — ED Provider Notes (Signed)
I saw and evaluated the patient, reviewed the resident's note and I agree with the findings and plan.  Pertinent History: Hx of  Crohn's - and ESRD but not on dialysis (has fistula), presents with 3 days of abd pain, n/v/d much worse than usual - pain is R sided, some f/c at home.    Pertinent Exam findings: has R sided ttp but soft and non distended.  She is severely hypokalemic and hypocalcemic as well as anemic and with much worsening renal function.  She is still making urine.    Qt is borderline long.  Needs fluid rescucitation, K repletion, Renal consult - admit to hospitalist.    I d/w Dr. Jonnie Finner who states the hospitalist service can consult in the AM after fluid rescictation this PM.  Vitals:   03/22/17 0929 03/22/17 1138 03/22/17 1605 03/22/17 1619  BP:  (!) 144/61 (!) 136/52 127/63  Pulse:  94 94 (!) 105  Resp:   20   Temp:  98.7 F (37.1 C) 99.8 F (37.7 C) 99.4 F (37.4 C)  TempSrc:  Oral Oral Oral  SpO2: 99%   98%  Weight:      Height:         I personally interpreted the EKG as well as the resident and agree with the interpretation on the resident's chart.  Final diagnoses:  Crohn's disease of colon with complication ([place])  Generalized abdominal pain  Nausea vomiting and diarrhea  AKI (acute kidney injury) ([place])  Anemia, unspecified type      Noemi [place], MD 03/22/17 [phone]

## 2017-03-20 ENCOUNTER — Inpatient Hospital Stay (HOSPITAL_COMMUNITY): Payer: Medicare Other

## 2017-03-20 DIAGNOSIS — J449 Chronic obstructive pulmonary disease, unspecified: Secondary | ICD-10-CM

## 2017-03-20 DIAGNOSIS — R197 Diarrhea, unspecified: Secondary | ICD-10-CM

## 2017-03-20 DIAGNOSIS — E876 Hypokalemia: Secondary | ICD-10-CM

## 2017-03-20 DIAGNOSIS — N185 Chronic kidney disease, stage 5: Secondary | ICD-10-CM

## 2017-03-20 DIAGNOSIS — R569 Unspecified convulsions: Secondary | ICD-10-CM

## 2017-03-20 DIAGNOSIS — R112 Nausea with vomiting, unspecified: Secondary | ICD-10-CM

## 2017-03-20 DIAGNOSIS — E872 Acidosis: Secondary | ICD-10-CM

## 2017-03-20 HISTORY — PX: IR US GUIDE VASC ACCESS RIGHT: IMG2390

## 2017-03-20 HISTORY — PX: IR FLUORO GUIDE CV LINE RIGHT: IMG2283

## 2017-03-20 LAB — PROTIME-INR
INR: 1.16
PROTHROMBIN TIME: 14.7 s (ref 11.4–15.2)

## 2017-03-20 LAB — COMPREHENSIVE METABOLIC PANEL
ALK PHOS: 105 U/L (ref 38–126)
ALT: 11 U/L — ABNORMAL LOW (ref 14–54)
AST: 17 U/L (ref 15–41)
Albumin: 2.8 g/dL — ABNORMAL LOW (ref 3.5–5.0)
BILIRUBIN TOTAL: 0.7 mg/dL (ref 0.3–1.2)
BUN: 70 mg/dL — ABNORMAL HIGH (ref 6–20)
CALCIUM: 5.4 mg/dL — AB (ref 8.9–10.3)
CO2: <7 mmol/L — ABNORMAL LOW (ref 22–32)
CREATININE: 7.98 mg/dL — AB (ref 0.44–1.00)
Chloride: 119 mmol/L — ABNORMAL HIGH (ref 101–111)
GFR calc non Af Amer: 5 mL/min — ABNORMAL LOW (ref >60)
GFR, EST AFRICAN AMERICAN: 5 mL/min — AB (ref >60)
GLUCOSE: 44 mg/dL — AB (ref 65–99)
Potassium: 3 mmol/L — ABNORMAL LOW (ref 3.5–5.1)
Sodium: 142 mmol/L (ref 135–145)
TOTAL PROTEIN: 5.7 g/dL — AB (ref 6.5–8.1)

## 2017-03-20 LAB — CBC
HCT: 25.1 % — ABNORMAL LOW (ref 36.0–46.0)
HEMOGLOBIN: 8.2 g/dL — AB (ref 12.0–15.0)
MCH: 31.1 pg (ref 26.0–34.0)
MCHC: 32.7 g/dL (ref 30.0–36.0)
MCV: 95.1 fL (ref 78.0–100.0)
PLATELETS: 145 10*3/uL — AB (ref 150–400)
RBC: 2.64 MIL/uL — AB (ref 3.87–5.11)
RDW: 16.7 % — ABNORMAL HIGH (ref 11.5–15.5)
WBC: 7.5 10*3/uL (ref 4.0–10.5)

## 2017-03-20 LAB — PROTEIN / CREATININE RATIO, URINE
CREATININE, URINE: 64.44 mg/dL
Protein Creatinine Ratio: 1.19 mg/mg{Cre} — ABNORMAL HIGH (ref 0.00–0.15)
Total Protein, Urine: 77 mg/dL

## 2017-03-20 LAB — MAGNESIUM: MAGNESIUM: 0.8 mg/dL — AB (ref 1.7–2.4)

## 2017-03-20 LAB — GLUCOSE, CAPILLARY
GLUCOSE-CAPILLARY: 131 mg/dL — AB (ref 65–99)
Glucose-Capillary: 170 mg/dL — ABNORMAL HIGH (ref 65–99)

## 2017-03-20 LAB — MRSA PCR SCREENING: MRSA by PCR: NEGATIVE

## 2017-03-20 LAB — IRON AND TIBC
IRON: 91 ug/dL (ref 28–170)
Saturation Ratios: 41 % — ABNORMAL HIGH (ref 10.4–31.8)
TIBC: 223 ug/dL — AB (ref 250–450)
UIBC: 132 ug/dL

## 2017-03-20 LAB — APTT: aPTT: 35 seconds (ref 24–36)

## 2017-03-20 LAB — SODIUM, URINE, RANDOM: SODIUM UR: 57 mmol/L

## 2017-03-20 LAB — PHOSPHORUS: Phosphorus: 5.7 mg/dL — ABNORMAL HIGH (ref 2.5–4.6)

## 2017-03-20 LAB — CREATININE, URINE, RANDOM: Creatinine, Urine: 63.97 mg/dL

## 2017-03-20 MED ORDER — FENTANYL CITRATE (PF) 100 MCG/2ML IJ SOLN
INTRAMUSCULAR | Status: AC
  Filled 2017-03-20: qty 2

## 2017-03-20 MED ORDER — METRONIDAZOLE IN NACL 5-0.79 MG/ML-% IV SOLN
500.0000 mg | Freq: Three times a day (TID) | INTRAVENOUS | Status: DC
  Administered 2017-03-20 – 2017-03-21 (×4): 500 mg via INTRAVENOUS
  Filled 2017-03-20 (×6): qty 100

## 2017-03-20 MED ORDER — ONDANSETRON HCL 4 MG/2ML IJ SOLN
4.0000 mg | Freq: Four times a day (QID) | INTRAMUSCULAR | Status: DC | PRN
  Administered 2017-03-20: 4 mg via INTRAVENOUS
  Filled 2017-03-20: qty 2

## 2017-03-20 MED ORDER — STERILE WATER FOR INJECTION IV SOLN
INTRAVENOUS | Status: DC
  Administered 2017-03-20 – 2017-03-21 (×2): via INTRAVENOUS
  Filled 2017-03-20 (×6): qty 850

## 2017-03-20 MED ORDER — SODIUM CHLORIDE 0.9 % IV BOLUS (SEPSIS)
500.0000 mL | Freq: Once | INTRAVENOUS | Status: AC
  Administered 2017-03-20: 500 mL via INTRAVENOUS

## 2017-03-20 MED ORDER — DEXTROSE 50 % IV SOLN
12.5000 g | Freq: Once | INTRAVENOUS | Status: AC
  Administered 2017-03-20: 12.5 g via INTRAVENOUS
  Filled 2017-03-20: qty 50

## 2017-03-20 MED ORDER — FLUTICASONE FUROATE-VILANTEROL 100-25 MCG/INH IN AEPB
1.0000 | INHALATION_SPRAY | Freq: Every day | RESPIRATORY_TRACT | Status: DC
  Administered 2017-03-21 – 2017-03-27 (×5): 1 via RESPIRATORY_TRACT
  Filled 2017-03-20 (×3): qty 28

## 2017-03-20 MED ORDER — VITAMIN D 1000 UNITS PO TABS
2000.0000 [IU] | ORAL_TABLET | Freq: Two times a day (BID) | ORAL | Status: DC
  Administered 2017-03-20: 2000 [IU] via ORAL
  Filled 2017-03-20: qty 2

## 2017-03-20 MED ORDER — MAGNESIUM SULFATE 2 GM/50ML IV SOLN
2.0000 g | Freq: Once | INTRAVENOUS | Status: AC
  Administered 2017-03-20: 2 g via INTRAVENOUS
  Filled 2017-03-20: qty 50

## 2017-03-20 MED ORDER — LANTHANUM CARBONATE 500 MG PO CHEW: 1000.0000 mg | CHEWABLE_TABLET | Freq: Three times a day (TID) | ORAL | Status: DC

## 2017-03-20 MED ORDER — SODIUM BICARBONATE 650 MG PO TABS
1950.0000 mg | ORAL_TABLET | Freq: Two times a day (BID) | ORAL | Status: DC
  Administered 2017-03-20: 1950 mg via ORAL
  Filled 2017-03-20: qty 3

## 2017-03-20 MED ORDER — ONDANSETRON HCL 4 MG PO TABS: 4.0000 mg | ORAL_TABLET | Freq: Four times a day (QID) | ORAL | Status: DC | PRN

## 2017-03-20 MED ORDER — LIDOCAINE HCL 1 % IJ SOLN
INTRAMUSCULAR | Status: AC
  Filled 2017-03-20: qty 20

## 2017-03-20 MED ORDER — STERILE WATER FOR INJECTION IV SOLN: INTRAVENOUS | Status: DC

## 2017-03-20 MED ORDER — LORAZEPAM 2 MG/ML IJ SOLN: 2.0000 mg | INTRAMUSCULAR | Status: DC | PRN

## 2017-03-20 MED ORDER — FLUTICASONE-UMECLIDIN-VILANT 100-62.5-25 MCG/INH IN AEPB: 1.0000 | INHALATION_SPRAY | Freq: Every day | RESPIRATORY_TRACT | Status: DC

## 2017-03-20 MED ORDER — FOLIC ACID 1 MG PO TABS
1.0000 mg | ORAL_TABLET | Freq: Every day | ORAL | Status: DC
  Administered 2017-03-20 – 2017-03-21 (×2): 1 mg via ORAL
  Filled 2017-03-20 (×2): qty 1

## 2017-03-20 MED ORDER — SODIUM CHLORIDE 0.9 % IV SOLN
2.0000 g | Freq: Once | INTRAVENOUS | Status: AC
  Administered 2017-03-20: 2 g via INTRAVENOUS
  Filled 2017-03-20: qty 20

## 2017-03-20 MED ORDER — SODIUM BICARBONATE 8.4 % IV SOLN
50.0000 meq | Freq: Once | INTRAVENOUS | Status: AC
  Administered 2017-03-20: 50 meq via INTRAVENOUS
  Filled 2017-03-20: qty 50

## 2017-03-20 MED ORDER — ADALIMUMAB 40 MG/0.8ML ~~LOC~~ PSKT: 40.0000 mg | PREFILLED_SYRINGE | SUBCUTANEOUS | Status: DC

## 2017-03-20 MED ORDER — LEVOFLOXACIN IN D5W 500 MG/100ML IV SOLN
500.0000 mg | INTRAVENOUS | Status: DC
  Administered 2017-03-20: 500 mg via INTRAVENOUS
  Filled 2017-03-20: qty 100

## 2017-03-20 MED ORDER — CALCIUM CARBONATE 1250 (500 CA) MG PO TABS
2.0000 | ORAL_TABLET | Freq: Two times a day (BID) | ORAL | Status: DC
  Administered 2017-03-21 – 2017-03-27 (×11): 1000 mg via ORAL
  Filled 2017-03-20 (×2): qty 2
  Filled 2017-03-20 (×5): qty 1
  Filled 2017-03-20: qty 2
  Filled 2017-03-20: qty 1
  Filled 2017-03-20: qty 2
  Filled 2017-03-20 (×5): qty 1

## 2017-03-20 MED ORDER — LORAZEPAM 2 MG/ML IJ SOLN: 2.0000 mg | Freq: Once | INTRAMUSCULAR | Status: DC

## 2017-03-20 MED ORDER — ACETAMINOPHEN 325 MG PO TABS
325.0000 mg | ORAL_TABLET | Freq: Four times a day (QID) | ORAL | Status: DC | PRN
  Administered 2017-03-21: 325 mg via ORAL
  Filled 2017-03-20: qty 1

## 2017-03-20 MED ORDER — LORAZEPAM 2 MG/ML IJ SOLN
INTRAMUSCULAR | Status: AC
  Filled 2017-03-20: qty 1

## 2017-03-20 MED ORDER — CYANOCOBALAMIN 1000 MCG/ML IJ SOLN
1000.0000 ug | INTRAMUSCULAR | Status: DC
  Administered 2017-03-25: 1000 ug via INTRAMUSCULAR
  Filled 2017-03-20 (×2): qty 1

## 2017-03-20 MED ORDER — CALCITRIOL 0.25 MCG PO CAPS
0.5000 ug | ORAL_CAPSULE | Freq: Every day | ORAL | Status: DC
  Administered 2017-03-20: 0.5 ug via ORAL
  Filled 2017-03-20: qty 2

## 2017-03-20 MED ORDER — LEVOTHYROXINE SODIUM 125 MCG PO TABS
125.0000 ug | ORAL_TABLET | Freq: Every day | ORAL | Status: DC
  Administered 2017-03-20 – 2017-03-27 (×8): 125 ug via ORAL
  Filled 2017-03-20 (×8): qty 1

## 2017-03-20 MED ORDER — CALCIUM CARBONATE-VITAMIN D 500-200 MG-UNIT PO TABS
1.0000 | ORAL_TABLET | Freq: Two times a day (BID) | ORAL | Status: DC
  Administered 2017-03-20: 1 via ORAL
  Filled 2017-03-20: qty 1

## 2017-03-20 MED ORDER — POTASSIUM CHLORIDE IN NACL 40-0.9 MEQ/L-% IV SOLN
INTRAVENOUS | Status: DC
  Administered 2017-03-20: 125 mL/h via INTRAVENOUS
  Filled 2017-03-20: qty 1000

## 2017-03-20 MED ORDER — POTASSIUM CHLORIDE CRYS ER 20 MEQ PO TBCR
40.0000 meq | EXTENDED_RELEASE_TABLET | Freq: Two times a day (BID) | ORAL | Status: DC
  Administered 2017-03-20 – 2017-03-21 (×3): 40 meq via ORAL
  Filled 2017-03-20 (×3): qty 2

## 2017-03-20 MED ORDER — KCL IN DEXTROSE-NACL 40-5-0.9 MEQ/L-%-% IV SOLN
INTRAVENOUS | Status: DC
  Administered 2017-03-20 (×2): via INTRAVENOUS
  Filled 2017-03-20 (×3): qty 1000

## 2017-03-20 MED ORDER — NITROGLYCERIN 0.4 MG SL SUBL: 0.4000 mg | SUBLINGUAL_TABLET | SUBLINGUAL | Status: DC | PRN

## 2017-03-20 MED ORDER — HEPARIN SODIUM (PORCINE) 1000 UNIT/ML IJ SOLN
INTRAMUSCULAR | Status: AC
  Filled 2017-03-20: qty 1

## 2017-03-20 MED ORDER — ALBUTEROL SULFATE (2.5 MG/3ML) 0.083% IN NEBU: 3.0000 mL | INHALATION_SOLUTION | Freq: Four times a day (QID) | RESPIRATORY_TRACT | Status: DC | PRN

## 2017-03-20 MED ORDER — METHYLPREDNISOLONE SODIUM SUCC 125 MG IJ SOLR
80.0000 mg | Freq: Two times a day (BID) | INTRAMUSCULAR | Status: DC
  Administered 2017-03-20 (×2): 80 mg via INTRAVENOUS
  Filled 2017-03-20 (×2): qty 2

## 2017-03-20 MED ORDER — UMECLIDINIUM BROMIDE 62.5 MCG/INH IN AEPB
1.0000 | INHALATION_SPRAY | Freq: Every day | RESPIRATORY_TRACT | Status: DC
  Administered 2017-03-21 – 2017-03-27 (×5): 1 via RESPIRATORY_TRACT
  Filled 2017-03-20 (×3): qty 7

## 2017-03-20 MED ORDER — LIDOCAINE HCL 1 % IJ SOLN
INTRAMUSCULAR | Status: AC | PRN
  Administered 2017-03-20: 10 mL

## 2017-03-20 MED ORDER — PROMETHAZINE HCL 25 MG PO TABS: 25.0000 mg | ORAL_TABLET | Freq: Four times a day (QID) | ORAL | Status: DC | PRN

## 2017-03-20 NOTE — Sedation Documentation (Signed)
Patient is resting comfortably. 

## 2017-03-20 NOTE — Consult Note (Signed)
Chief Complaint: Patient was seen in consultation today for tunneled dialysis catheter placement Chief Complaint  Patient presents with  . Abdominal Pain   at the request of Dr Lenna Sciara Deterding   Supervising Physician: Arne Cleveland  Patient Status: [facility] - In-pt  History of Present Illness: Natasha [name] is a 71 y.o. female   Chronic renal failure Left arm dialysis fistula placed 01/09/17- Dr Bridgett Larsson Not yet mature Admitted last night with weakness; N/V/D Renal function increasing/worsening Dialysis fistula not yet mature Dr Deterding feels need urgent dialysis \and wants tunneled catheter if can- today  Pt ate clears at 8 am today---npo since then   Past Medical History:  Diagnosis Date  . Anal fistula   . Anemia in chronic kidney disease (CKD)   . Aortic insufficiency    Echo 3/18: mod conc LVH, EF 60-65, no RWMA, Gr 1 DD, mod AI, mild MR, normal RVSF, Trivial TR  . Arthritis   . Borderline hypertension   . Bulging disc    L3-L4  . Chronic diarrhea    due to crohn's  . CKD (chronic kidney disease), stage IV ([place])    NEPHROLOGIST--  DR MATTINGLY  . Crohn's disease ([place])    chronic ileitis  . Dyspnea    with exertion  . Emphysema/COPD ([place])   . GERD (gastroesophageal reflux disease)    denies  . History of glaucoma   . History of kidney stones   . History of pancreatitis    2008--  mercaptopurine  . History of small bowel obstruction    12-03-2010  due to crohn's ileitis  . Hypertension    no longer on medications  . Hypothyroidism, postsurgical    multinodule w/ hurthle cells  . Nephrolithiasis    bilateral  . Osteoporosis   . Perianal Crohn's disease ([place])   . Peripheral vascular disease ([place])    blood clot behind knee left leg  . Polyarthralgia   . Wears partial dentures    upper    Past Surgical History:  Procedure Laterality Date  . ABDOMINAL HYSTERECTOMY  1990   and  Appendectomy  . BASCILIC VEIN TRANSPOSITION Left 01/09/2017   Procedure: LEFT 1ST STAGE BRACHIAL VEIN TRANSPOSITION;  Surgeon: Conrad Santa Cruz, MD;  Location: [place];  Service: Vascular;  Laterality: Left;  . COLON RESECTION  x3 --  1978,  1987, [place] x2/   [place]  . COLONOSCOPY WITH PROPOFOL N/A 09/25/2014   Procedure: COLONOSCOPY WITH PROPOFOL;  Surgeon: Garlan Fair, MD;  Location: [facility];  Service: Endoscopy;  Laterality: N/A;  . CYSTOSCOPY W/ URETERAL STENT PLACEMENT Right 11/19/2016   Procedure: CYSTOSCOPY WITH RIGHT RETROGRADE PYELOGRAM/ URETEROSCOPY WITH LASER AND RIGHT URETERAL STENT PLACEMENT;  Surgeon: Ardis Hughs, MD;  Location: [facility];  Service: Urology;  Laterality: Right;  . ESOPHAGOGASTRODUODENOSCOPY  03/04/2012   Procedure: ESOPHAGOGASTRODUODENOSCOPY (EGD);  Surgeon: Garlan Fair, MD;  Location: [facility];  Service: Endoscopy;  Laterality: N/A;  . EXAMINATION UNDER ANESTHESIA N/A 09/12/2014   Procedure: EXAM UNDER ANESTHESIA;  Surgeon: Rolm Bookbinder, MD;  Location: [place];  Service: General;  Laterality: N/A;  . FLEXIBLE SIGMOIDOSCOPY  03/04/2012   Procedure: FLEXIBLE SIGMOIDOSCOPY;  Surgeon: Garlan Fair, MD;  [facility]: [facility];  Service: Endoscopy;  Laterality: N/A;  . GLAUCOMA SURGERY Bilateral   . HOLMIUM LASER APPLICATION Right 4/74/2595   Procedure: HOLMIUM LASER APPLICATION;  Surgeon: Ardis Hughs, MD;  Location: [facility];  Service:  Urology;  Laterality: Right;  . INCISION AND DRAINAGE PERIRECTAL ABSCESS N/A 09/12/2014   Procedure: IRRIGATION AND DEBRIDEMENT PERIRECTAL ABSCESS;  Surgeon: Rolm Bookbinder, MD;  Location: [place];  Service: General;  Laterality: N/A;  . NEGATIVE SLEEP STUDY  2008  . PLACEMENT OF SETON N/A 12/01/2014   Procedure: PLACEMENT OF SETON;  Surgeon: [name], MD;  Location: [facility];  Service: General;  Laterality: N/A;  . RECTAL EXAM UNDER ANESTHESIA N/A 12/01/2014   Procedure: RECTAL EXAM UNDER ANESTHESIA;  Surgeon: [name], MD;  Location: [place];  Service: General;  Laterality: N/A;  . TOTAL THYROIDECTOMY  10-13-2003  . TRANSTHORACIC ECHOCARDIOGRAM  07-11-2013   mild LVH,  ef 55%,  moderate AR,  mild MR and TR,  trivial PR    Allergies: Mercaptopurine and Remicade [infliximab]  Medications: Prior to Admission medications   Medication Sig Start Date End Date Taking? Authorizing Provider  acetaminophen (TYLENOL) 325 MG tablet Take 325 mg by mouth every 6 (six) hours as needed for mild pain.   Yes [provider]  Adalimumab (HUMIRA) 40 MG/0.8ML PSKT Inject 40 mg into the skin every 14 (fourteen) days.   Yes [provider]  albuterol (PROVENTIL HFA;VENTOLIN HFA) 108 (90 BASE) MCG/ACT inhaler Inhale 2 puffs into the lungs every 6 (six) hours as needed for wheezing or shortness of breath.   Yes [provider]  alendronate (FOSAMAX) 70 MG tablet Take 70 mg by mouth once a week. Mondays 11/06/16  Yes [provider]  calcitRIOL (ROCALTROL) 0.25 MCG capsule Take 0.5 mcg by mouth daily.   Yes [provider]  calcium-vitamin D (OSCAL WITH D) 500-200 MG-UNIT tablet Take 1 tablet by mouth 2 (two) times daily.   Yes [provider]  Cholecalciferol (VITAMIN D3) 2000 UNITS TABS Take 2,000 Units by mouth 2 (two) times daily.  04/07/14  Yes [provider]  cyanocobalamin (,VITAMIN B-12,) 1000 MCG/ML injection Inject 1,000 mcg into the muscle See admin instructions. Every 60 days   Yes [provider]  Fluticasone-Umeclidin-Vilant (TRELEGY ELLIPTA) 100-62.5-25 MCG/INH AEPB Inhale 1 puff into the lungs daily. 02/27/17  Yes Chesley Mires, MD  folic acid (FOLVITE) 1 MG tablet Take 1 mg by mouth daily.   Yes [provider]  furosemide (LASIX) 20 MG tablet Take 20 mg by mouth 2 (two) times daily with a meal.   Yes [provider]  levothyroxine (SYNTHROID, LEVOTHROID) 125 MCG tablet Take 125 mcg by mouth daily before  breakfast.    Yes [provider]  nitroGLYCERIN (NITROSTAT) 0.4 MG SL tablet Place 1 tablet (0.4 mg total) under the tongue every 5 (five) minutes as needed for chest pain. 08/19/16 03/19/17 Yes Weaver, Scott T, PA-C  potassium chloride SA (K-DUR,KLOR-CON) 20 MEQ tablet Take 40 mEq by mouth 2 (two) times daily with a meal.  09/24/15  Yes [provider]  predniSONE (DELTASONE) 5 MG tablet Take 5 mg by mouth daily with breakfast.   Yes [provider]  promethazine (PHENERGAN) 25 MG tablet Take 25 mg by mouth every 6 (six) hours as needed for nausea or vomiting.  04/19/14  Yes [provider]  sodium bicarbonate 650 MG tablet Take 2 tablets (1,300 mg total) by mouth 2 (two) times daily. Patient taking differently: Take 1,950 mg by mouth 2 (two) times daily.  11/21/16  Yes Mariel Aloe, MD  traMADol (ULTRAM) 50 MG tablet Take 1-2 tablets (50-100 mg total) by mouth every  6 (six) hours as needed for severe pain. 01/09/17  Yes Ulyses Amor, PA-C     Family History  Problem Relation Age of Onset  . Hypertension Father   . Heart disease Father   . Heart attack Father   . Diverticulitis Mother   . COPD Mother   . Hypertension Mother   . Heart disease Mother   . Heart attack Mother   . Heart attack Brother   . Colon cancer Brother   . Diabetes Brother   . Thyroid disease Brother     Social History   Social History  . Marital status: Married    Spouse name: [name]  . Number of children: 3  . Years of education: 12   Occupational History  . Retired Retired   Social History Main Topics  . Smoking status: Former Smoker    Packs/day: 0.25    Years: 15.00    Types: Cigarettes    Quit date: 11/27/2001  . Smokeless tobacco: Never Used  . Alcohol use No  . Drug use: No  . Sexual activity: No   Other Topics Concern  . None   Social History Narrative   Patient is married [name]) and lives at home with her husband.   Patient has three children.    Patient is retired on disability.   Patient drinks two cups of caffeine daily.   Patient is right-handed.             Review of Systems: A 12 point ROS discussed and pertinent positives are indicated in the HPI above.  All other systems are negative.  Review of Systems  Constitutional: Positive for activity change and fatigue. Negative for fever.  Respiratory: Negative for cough and shortness of breath.   Cardiovascular: Negative for chest pain.  Gastrointestinal: Positive for diarrhea and nausea.  Neurological: Positive for weakness.  Psychiatric/Behavioral: Negative for behavioral problems and confusion.    Vital Signs: BP 117/62 (BP Location: Right Arm)   Pulse 97   Temp 98.2 F (36.8 C) (Oral)   Resp 18   Ht 5' 3"  (1.6 m)   Wt 93 lb 14.7 oz (42.6 kg)   SpO2 100%   BMI 16.64 kg/m   Physical Exam  Constitutional: She is oriented to person, place, and time.  Cardiovascular: Normal rate and regular rhythm.   Pulmonary/Chest: Effort normal and breath sounds normal.  Abdominal: Soft. Bowel sounds are normal.  Musculoskeletal: Normal range of motion.  Neurological: She is alert and oriented to person, place, and time.  Skin: Skin is warm and dry.  Psychiatric: She has a normal mood and affect. Her behavior is normal. Judgment and thought content normal.  Pt unable to sign consent secondary "stiff fingers" Husband at bedside- consented for pt  Nursing note and vitals reviewed.   Imaging: Ct Abdomen Pelvis Wo Contrast  Result Date: 03/20/2017 CLINICAL DATA:  Acute onset of generalized abdominal pain, nausea and diarrhea. Initial encounter. EXAM: CT ABDOMEN AND PELVIS WITHOUT CONTRAST TECHNIQUE: Multidetector CT imaging of the abdomen and pelvis was performed following the standard protocol without IV contrast. COMPARISON:  CT of the abdomen and pelvis performed 11/18/2016 FINDINGS: Lower chest: Minimal right basilar atelectasis is noted. The visualized portions of the  mediastinum are unremarkable. Hepatobiliary: The liver is unremarkable in appearance. The patient is status post cholecystectomy, with clips noted at the gallbladder fossa. The common bile duct remains normal in caliber. Pancreas: The pancreas is within normal limits. Spleen: The spleen is unremarkable  in appearance. Adrenals/Urinary Tract: The adrenal glands are unremarkable in appearance. Nonobstructing bilateral renal stones measure up to 7 mm in size. There is no evidence of hydronephrosis. No obstructing ureteral stones are seen. A right renal cyst is noted. No significant perinephric stranding is appreciated. Stomach/Bowel: The patient's ileocolic anastomosis is unremarkable in appearance. The remaining colon is within normal limits. Contrast has progressed to the transverse and descending colon. The small bowel is grossly unremarkable. Mild soft tissue inflammation is noted about the gastric fundus, raising question for mild gastritis. Vascular/Lymphatic: Scattered calcification is seen along the abdominal aorta and its branches. The abdominal aorta is otherwise grossly unremarkable. The inferior vena cava is grossly unremarkable. No retroperitoneal lymphadenopathy is seen. No pelvic sidewall lymphadenopathy is identified. Reproductive: The bladder is moderately distended and grossly unremarkable. The patient is status post hysterectomy. No suspicious adnexal masses are seen. Other: No additional soft tissue abnormalities are seen. Musculoskeletal: No acute osseous abnormalities are identified. The visualized musculature is unremarkable in appearance. IMPRESSION: 1. Mild soft tissue inflammation about the gastric fundus, raising question for mild gastritis. Would correlate with the patient's symptoms. 2. Scattered aortic atherosclerosis. 3. Nonobstructing bilateral renal stones measure up to 7 mm in size. Right renal cysts noted. Electronically Signed   By: Garald Balding M.D.   On: 03/20/2017 03:13     Labs:  CBC:  Recent Labs  11/19/16 0859 11/20/16 0513 01/09/17 0704 03/19/17 1453 03/20/17 0608  WBC 8.7 7.2  --  8.7 7.5  HGB 10.0* 9.7* 10.9* 8.7* 8.2*  HCT 32.6* 30.8* 32.0* 27.1* 25.1*  PLT 129* 111*  --  153 145*    COAGS:  Recent Labs  03/20/17 1230  INR 1.16  APTT 35    BMP:  Recent Labs  11/20/16 1422 11/21/16 1032 01/09/17 0704 03/19/17 1453 03/20/17 0608  NA 137 143 144 141 142  K 5.0 3.5 3.7 2.4* 3.0*  CL 121* 121*  --  118* 119*  CO2 10* 16*  --  9* <7*  GLUCOSE 183* 106* 82 97 44*  BUN 38* 38*  --  68* 70*  CALCIUM 8.3* 8.4*  --  5.4* 5.4*  CREATININE 4.20* 3.90*  --  8.46* 7.98*  GFRNONAA 10* 11*  --  4* 5*  GFRAA 11* 12*  --  5* 5*    LIVER FUNCTION TESTS:  Recent Labs  07/15/16 0549 11/18/16 0747 03/19/17 1453 03/20/17 0608  BILITOT 0.6 0.6 0.6 0.7  AST 19 16 20 17   ALT 10* 11* 13* 11*  ALKPHOS 75 153* 115 105  PROT 4.8* 6.5 6.2* 5.7*  ALBUMIN 2.5* 3.6 3.1* 2.8*    TUMOR MARKERS: No results for input(s): AFPTM, CEA, CA199, CHROMGRNA in the last 8760 hours.  Assessment and Plan:  Worsening renal failure Not yet mature left arm dialysis fistula Planned for tunneled dialysis catheter placement Risks and benefits discussed with the patient including, but not limited to bleeding, infection, vascular injury, pneumothorax which may require chest tube placement, air embolism or even death All of the patient's questions were answered, patient is agreeable to proceed. Consent signed and in chart.  Thank you for this interesting consult.  I greatly enjoyed meeting Natasha [name] and look forward to participating in their care.  A copy of this report was sent to the requesting provider on this date.  Electronically Signed: Lavonia Drafts, PA-C 03/20/2017, 1:55 PM   I spent a total of 20 Minutes    in face to face in  clinical consultation, greater than 50% of which was counseling/coordinating care for tunneled HD catheter

## 2017-03-20 NOTE — Significant Event (Signed)
Rapid Response Event Note  Overview:  Called to assist with patient with new onset seizure Time Called: 1404 Arrival Time: 1412 Event Type: Neurologic  Initial Focused Assessment:  On arrival patient supine in bed - seems to be post-ictal with snoring respirations - maintaining  O2 sats - 100% on RA - RR 24 - warm and dry - Bp 93/46 HR 100 CBG 131.  Husband reports patient ambulated to bathroom and on return began to shake - no history of seizures.  No seizure activity on my arrival.   Interventions:  Maintained airway - Dr. Loleta Books to bedside - reports patient with multiple chemistry imbalances and is awaiting urgent placement of HD catheter for urgent HD - Magnesium and Calcium currently being replaced with bicarb drip infusing.  IV's patent.  Suction at bedside.  Patient remains stable but still sleepy.  For transfer to SDU.  Handoff to [place] and Elmyra Ricks RN  - to call as needed.   Plan of Care (if not transferred):  Event Summary: Name of Physician Notified: Dr. Loleta Books at  (pta RRT)    at    Outcome: Transferred (Comment)  Event End Time: 1510  Quin Hoop

## 2017-03-20 NOTE — Progress Notes (Signed)
Pharmacy Antibiotic Note  Natasha [name] is a 71 y.o. female admitted on 03/19/2017 with intra-abdominal infection.  Pharmacy has been consulted for Levaquin dosing. WBC WNL. Hx Crohns. Acute on chronic renal failure with CrCl <10.   Plan: Levaquin 500 mg IV q48h Flagyl per MD F/U infectious work-up  Height: 5' 3"  (160 cm) Weight: 93 lb 14.7 oz (42.6 kg) IBW/kg (Calculated) : 52.4  Temp (24hrs), Avg:98.5 F (36.9 C), Min:98.2 F (36.8 C), Max:98.7 F (37.1 C)   Recent Labs Lab 03/19/17 1453 03/19/17 1633  WBC 8.7  --   CREATININE 8.46*  --   LATICACIDVEN  --  0.79    Estimated Creatinine Clearance: 4.2 mL/min (A) (by C-G formula based on SCr of 8.46 mg/dL (H)).      Narda Bonds 03/20/2017 1:50 AM

## 2017-03-20 NOTE — Progress Notes (Signed)
Walking past patient's room when husband was yelling for help.  Patient laying on bed with full body shaking and foaming from mouth.  Lasted approx. 1-1 1/2 minutes. Turned patient on side and suctioned mouth. MD and rapid response notified and came to bedside.  MD advised to hold ativan and placing orders for IV keppra.  Patients husband stated he took patient to bathroom and when sitting her back on bed, she began to shake.  Patient taken to IR for cath placement and to be transferred to progressive care.

## 2017-03-20 NOTE — Procedures (Signed)
  Procedure: R IJ Tunneled HD catheter placement 19cm Preprocedure diagnosis:Renal failure Postprocedure diagnosis: same EBL:   minimal Complications:  none immediate  See full dictation in [facility].  Dillard Cannon MD Main # [phone] Pager  [phone]

## 2017-03-20 NOTE — Consult Note (Signed)
Reason for Consult: acute worsening of renal function  Referring Physician: Yanique [name] is an 71 y.o. female. PMH Crohns disease, ESRD, COPD, hypothyroidism, anemia of chronic disease  HPI: Natasha [name] presented 10/25 with one-week of nausea following diarrheaand generalized abdominal cramps was admitted for Crohn's flare and urinary tract infection. She struggles with nausea constantly but began feeling worse 2 weeks ago. This Monday her nausea became worse and was associated with nausea and vomiting and diarrhea. She was having 6 watery foul smelling bowel movements per day. Since yesterday her husband notes that she seems more unsteady on her feet and confused.  Natasha [name] has been following with Dr. Mercy Moore for the last 5 years. She had first stage of AVF placed in august, due for second stage. Last crt in June 3.9. At presentatio nlabs were significant for crt 8.46,potassium 2.4, B1 68, anion gap 14, urinalysis showed moderate hemoglobin with some protein, neg nitrite, neg leukocytes. CT of the abdomen was suggestive of mild gastritis and showed nonobstructing bilateral renal stones up to 7 mm and a cyst in the right kidney. There is no hydronephrosis or perinephric stranding. Urine and blood cultures were drawn. She denies recent NSAID or Ace or ARB use. Has had no recent contrast studies.  Natasha [name] is oriented but somnolent, her husband provides the majority of her history.   Trend in Creatinine: Creatinine, Ser  Date/Time Value Ref Range Status  03/20/2017 06:08 AM 7.98 (H) 0.44 - 1.00 mg/dL Final  03/19/2017 02:53 PM 8.46 (H) 0.44 - 1.00 mg/dL Final  11/21/2016 10:32 AM 3.90 (H) 0.44 - 1.00 mg/dL Final  11/20/2016 02:22 PM 4.20 (H) 0.44 - 1.00 mg/dL Final  11/20/2016 08:45 AM 3.98 (H) 0.44 - 1.00 mg/dL Final  11/19/2016 08:59 AM 4.19 (H) 0.44 - 1.00 mg/dL Final  11/18/2016 07:47 AM 3.58 (H) 0.44 - 1.00 mg/dL Final  07/30/2016 11:56 AM 2.84 (H) 0.57 - 1.00 mg/dL  Final  07/15/2016 05:49 AM 2.99 (H) 0.44 - 1.00 mg/dL Final  07/14/2016 05:41 AM 3.53 (H) 0.44 - 1.00 mg/dL Final  07/13/2016 05:44 AM 3.94 (H) 0.44 - 1.00 mg/dL Final  07/12/2016 06:09 AM 4.31 (H) 0.44 - 1.00 mg/dL Final  07/11/2016 04:38 PM 4.93 (H) 0.44 - 1.00 mg/dL Final  04/16/2016 08:58 PM 4.00 (H) 0.44 - 1.00 mg/dL Final  03/17/2016 12:05 PM 3.23 (H) 0.44 - 1.00 mg/dL Final  10/14/2015 04:21 AM 2.99 (H) 0.44 - 1.00 mg/dL Final  10/13/2015 03:46 AM 2.98 (H) 0.44 - 1.00 mg/dL Final  10/12/2015 06:54 PM 3.58 (H) 0.44 - 1.00 mg/dL Final  12/01/2014 10:16 AM 3.10 (H) 0.44 - 1.00 mg/dL Final  09/14/2014 08:38 AM 2.81 (H) 0.50 - 1.10 mg/dL Final  09/12/2014 06:51 PM 2.67 (H) 0.50 - 1.10 mg/dL Final  09/11/2014 07:25 AM 2.63 (H) 0.50 - 1.10 mg/dL Final  09/10/2014 06:36 AM 2.94 (H) 0.50 - 1.10 mg/dL Final  09/09/2014 06:00 AM 3.67 (H) 0.50 - 1.10 mg/dL Final  04/07/2014 04:15 AM 3.06 (H) 0.50 - 1.10 mg/dL Final  04/06/2014 04:50 AM 3.23 (H) 0.50 - 1.10 mg/dL Final  04/05/2014 05:45 AM 3.44 (H) 0.50 - 1.10 mg/dL Final  04/04/2014 05:47 AM 3.62 (H) 0.50 - 1.10 mg/dL Final  04/03/2014 03:31 AM 4.16 (H) 0.50 - 1.10 mg/dL Final  04/02/2014 03:30 AM 4.62 (H) 0.50 - 1.10 mg/dL Final  04/01/2014 03:35 AM 5.53 (H) 0.50 - 1.10 mg/dL Final  03/31/2014 03:13 AM 5.78 (H) 0.50 - 1.10 mg/dL  Final  03/30/2014 11:08 PM 5.76 (H) 0.50 - 1.10 mg/dL Final  03/30/2014 04:30 PM 5.76 (H) 0.50 - 1.10 mg/dL Final  03/30/2014 07:35 AM 5.83 (H) 0.50 - 1.10 mg/dL Final  03/29/2014 07:54 PM 7.30 (H) 0.50 - 1.10 mg/dL Final  03/29/2014 07:41 PM 5.94 (H) 0.50 - 1.10 mg/dL Final  05/18/2013 04:58 AM 2.11 (H) 0.50 - 1.10 mg/dL Final  05/17/2013 04:50 AM 2.18 (H) 0.50 - 1.10 mg/dL Final  05/16/2013 05:20 AM 2.23 (H) 0.50 - 1.10 mg/dL Final  05/15/2013 05:30 AM 2.36 (H) 0.50 - 1.10 mg/dL Final  05/14/2013 04:31 AM 2.35 (H) 0.50 - 1.10 mg/dL Final  05/13/2013 06:30 AM 2.64 (H) 0.50 - 1.10 mg/dL Final  05/12/2013 10:45  PM 2.65 (H) 0.50 - 1.10 mg/dL Final  05/12/2013 02:45 PM 3.02 (H) 0.50 - 1.10 mg/dL Final  06/15/2012 04:40 AM 3.68 (H) 0.50 - 1.10 mg/dL Final  06/14/2012 08:02 AM 4.78 (H) 0.50 - 1.10 mg/dL Final  06/13/2012 09:10 AM 6.24 (H) 0.50 - 1.10 mg/dL Final  06/12/2012 05:22 AM 8.27 (H) 0.50 - 1.10 mg/dL Final  06/11/2012 06:10 AM 9.55 (H) 0.50 - 1.10 mg/dL Final  06/10/2012 01:45 PM 9.69 (H) 0.50 - 1.10 mg/dL Final  06/10/2012 03:45 AM 10.15 (H) 0.50 - 1.10 mg/dL Final    PMH:   Past Medical History:  Diagnosis Date  . Anal fistula   . Anemia in chronic kidney disease (CKD)   . Aortic insufficiency    Echo 3/18: mod conc LVH, EF 60-65, no RWMA, Gr 1 DD, mod AI, mild MR, normal RVSF, Trivial TR  . Arthritis   . Borderline hypertension   . Bulging disc    L3-L4  . Chronic diarrhea    due to crohn's  . CKD (chronic kidney disease), stage IV ([place])    NEPHROLOGIST--  DR MATTINGLY  . Crohn's disease ([place])    chronic ileitis  . Dyspnea    with exertion  . Emphysema/COPD ([place])   . GERD (gastroesophageal reflux disease)    denies  . History of glaucoma   . History of kidney stones   . History of pancreatitis    2008--  mercaptopurine  . History of small bowel obstruction    12-03-2010  due to crohn's ileitis  . Hypertension    no longer on medications  . Hypothyroidism, postsurgical    multinodule w/ hurthle cells  . Nephrolithiasis    bilateral  . Osteoporosis   . Perianal Crohn's disease ([place])   . Peripheral vascular disease ([place])    blood clot behind knee left leg  . Polyarthralgia   . Wears partial dentures    upper    PSH:   Past Surgical History:  Procedure Laterality Date  . ABDOMINAL HYSTERECTOMY  1990   and  Appendectomy  . BASCILIC VEIN TRANSPOSITION Left 01/09/2017   Procedure: LEFT 1ST STAGE BRACHIAL VEIN TRANSPOSITION;  Surgeon: Conrad [place] Ridge, MD;  Location: [facility];  Service: Vascular;  Laterality: Left;  . COLON RESECTION  x3 --  1978,  1987, [place] x2/   [place]  . COLONOSCOPY WITH PROPOFOL N/A 09/25/2014   Procedure: COLONOSCOPY WITH PROPOFOL;  Surgeon: Garlan Fair, MD;  Location: [facility];  Service: Endoscopy;  Laterality: N/A;  . CYSTOSCOPY W/ URETERAL STENT PLACEMENT Right 11/19/2016   Procedure: CYSTOSCOPY WITH RIGHT RETROGRADE PYELOGRAM/ URETEROSCOPY WITH LASER AND RIGHT URETERAL STENT PLACEMENT;  Surgeon: Ardis Hughs, MD;  Location: [facility];  Service: Urology;  Laterality: Right;  . ESOPHAGOGASTRODUODENOSCOPY  03/04/2012   Procedure: ESOPHAGOGASTRODUODENOSCOPY (EGD);  Surgeon: Garlan Fair, MD;  Location: [facility];  Service: Endoscopy;  Laterality: N/A;  . EXAMINATION UNDER ANESTHESIA N/A 09/12/2014   Procedure: EXAM UNDER ANESTHESIA;  Surgeon: Rolm Bookbinder, MD;  Location: [place] [facility];  Service: General;  Laterality: N/A;  . FLEXIBLE SIGMOIDOSCOPY  03/04/2012   Procedure: FLEXIBLE SIGMOIDOSCOPY;  Surgeon: Garlan Fair, MD;  Location: [facility];  Service: Endoscopy;  Laterality: N/A;  . GLAUCOMA SURGERY Bilateral   . HOLMIUM LASER APPLICATION Right 11/12/5091   Procedure: HOLMIUM LASER APPLICATION;  Surgeon: Ardis Hughs, MD;  Location: [facility];  Service: Urology;  Laterality: Right;  . INCISION AND DRAINAGE PERIRECTAL ABSCESS N/A 09/12/2014   Procedure: IRRIGATION AND DEBRIDEMENT PERIRECTAL ABSCESS;  Surgeon: Rolm Bookbinder, MD;  Location: [facility];  Service: General;  Laterality: N/A;  . NEGATIVE SLEEP STUDY  2008  . PLACEMENT OF SETON N/A 12/01/2014   Procedure: PLACEMENT OF SETON;  Surgeon: [name], MD;  [facility];  Service: General;  Laterality: N/A;  . RECTAL EXAM UNDER ANESTHESIA N/A 12/01/2014   Procedure: RECTAL EXAM UNDER ANESTHESIA;  Surgeon: [name], MD;  Location: [facility];  Service: General;  Laterality: N/A;  . TOTAL THYROIDECTOMY  10-13-2003  . TRANSTHORACIC ECHOCARDIOGRAM  07-11-2013   mild LVH,  ef 55%,   moderate AR,  mild MR and TR,  trivial PR    Allergies:  Allergies  Allergen Reactions  . Mercaptopurine Other (See Comments)    Caused pancreatitis  . Remicade [Infliximab] Other (See Comments)    CAUSED JOINT PAIN    Medications:   Prior to Admission medications   Medication Sig Start Date End Date Taking? Authorizing Provider  acetaminophen (TYLENOL) 325 MG tablet Take 325 mg by mouth every 6 (six) hours as needed for mild pain.   Yes [provider]  Adalimumab (HUMIRA) 40 MG/0.8ML PSKT Inject 40 mg into the skin every 14 (fourteen) days.   Yes [provider]  albuterol (PROVENTIL HFA;VENTOLIN HFA) 108 (90 BASE) MCG/ACT inhaler Inhale 2 puffs into the lungs every 6 (six) hours as needed for wheezing or shortness of breath.   Yes [provider]  alendronate (FOSAMAX) 70 MG tablet Take 70 mg by mouth once a week. Mondays 11/06/16  Yes [provider]  calcitRIOL (ROCALTROL) 0.25 MCG capsule Take 0.5 mcg by mouth daily.   Yes [provider]  calcium-vitamin D (OSCAL WITH D) 500-200 MG-UNIT tablet Take 1 tablet by mouth 2 (two) times daily.   Yes [provider]  Cholecalciferol (VITAMIN D3) 2000 UNITS TABS Take 2,000 Units by mouth 2 (two) times daily.  04/07/14  Yes [provider]  cyanocobalamin (,VITAMIN B-12,) 1000 MCG/ML injection Inject 1,000 mcg into the muscle See admin instructions. Every 60 days   Yes [provider]  Fluticasone-Umeclidin-Vilant (TRELEGY ELLIPTA) 100-62.5-25 MCG/INH AEPB Inhale 1 puff into the lungs daily. 02/27/17  Yes Chesley Mires, MD  folic acid (FOLVITE) 1 MG tablet Take 1 mg by mouth daily.   Yes [provider]  furosemide (LASIX) 20 MG tablet Take 20 mg by mouth 2 (two) times daily with a meal.   Yes [provider]  levothyroxine (SYNTHROID, LEVOTHROID) 125 MCG tablet Take 125 mcg by mouth daily before breakfast.    Yes [provider]  nitroGLYCERIN  (NITROSTAT) 0.4 MG SL tablet Place 1 tablet (0.4 mg  total) under the tongue every 5 (five) minutes as needed for chest pain. 08/19/16 03/19/17 Yes Weaver, Scott T, PA-C  potassium chloride SA (K-DUR,KLOR-CON) 20 MEQ tablet Take 40 mEq by mouth 2 (two) times daily with a meal.  09/24/15  Yes [provider]  predniSONE (DELTASONE) 5 MG tablet Take 5 mg by mouth daily with breakfast.   Yes [provider]  promethazine (PHENERGAN) 25 MG tablet Take 25 mg by mouth every 6 (six) hours as needed for nausea or vomiting.  04/19/14  Yes [provider]  sodium bicarbonate 650 MG tablet Take 2 tablets (1,300 mg total) by mouth 2 (two) times daily. Patient taking differently: Take 1,950 mg by mouth 2 (two) times daily.  11/21/16  Yes Mariel Aloe, MD  traMADol (ULTRAM) 50 MG tablet Take 1-2 tablets (50-100 mg total) by mouth every 6 (six) hours as needed for severe pain. 01/09/17  Yes Ulyses Amor, PA-C    Inpatient medications: . calcitRIOL  0.5 mcg Oral Daily  . calcium-vitamin D  1 tablet Oral BID  . cholecalciferol  2,000 Units Oral BID  . [START ON 03/25/2017] cyanocobalamin  1,000 mcg Intramuscular Q8 Weeks  . fluticasone furoate-vilanterol  1 puff Inhalation Daily  . folic acid  1 mg Oral Daily  . levothyroxine  125 mcg Oral QAC breakfast  . potassium chloride SA  40 mEq Oral BID WC  . umeclidinium bromide  1 puff Inhalation Daily    Discontinued Meds:   Medications Discontinued During This Encounter  Medication Reason  . Adalimumab PSKT 40 mg Inpatient Standard  . Fluticasone-Umeclidin-Vilant 100-62.5-25 MCG/INH AEPB 1 puff Inpatient Standard  . 0.9 % NaCl with KCl 40 mEq / L  infusion   . methylPREDNISolone sodium succinate (SOLU-MEDROL) 125 mg/2 mL injection 80 mg   . sodium bicarbonate tablet 1,950 mg   . sodium bicarbonate 150 mEq in sterile water 1,000 mL infusion Entry Error    Social History:  reports that she quit smoking about 15 years ago. Her  smoking use included Cigarettes. She has a 3.75 pack-year smoking history. She has never used smokeless tobacco. She reports that she does not drink alcohol or use drugs.  Family History:   Family History  Problem Relation Age of Onset  . Hypertension Father   . Heart disease Father   . Heart attack Father   . Diverticulitis Mother   . COPD Mother   . Hypertension Mother   . Heart disease Mother   . Heart attack Mother   . Heart attack Brother   . Colon cancer Brother   . Diabetes Brother   . Thyroid disease Brother     Pertinent items are noted in HPI. Weight change:   Intake/Output Summary (Last 24 hours) at 03/20/17 1213 Last data filed at 03/20/17 1006  Gross per 24 hour  Intake           837.92 ml  Output                0 ml  Net           837.92 ml   BP (!) 100/49 (BP Location: Right Arm)   Pulse 90   Temp (!) 97.5 F (36.4 C) (Oral)   Resp 20   Ht 5' 3"  (1.6 m)   Wt 93 lb 14.7 oz (42.6 kg)   SpO2 93%   BMI 16.64 kg/m  Vitals:   03/20/17 0015 03/20/17 0045 03/20/17 0140 03/20/17 0531  BP: 105/63 (!) 105/55 (!) 106/48 (!) 100/49  Pulse: 92 82 89 90  Resp: 16 (!) 27 18 20   Temp:   98.2 F (36.8 C) (!) 97.5 F (36.4 C)  TempSrc:   Oral Oral  SpO2: 98% 94% 100% 93%  Weight:   93 lb 14.7 oz (42.6 kg)   Height:   5' 3"  (1.6 m)      General: Chronically ill-appearing, appears younger than stated age, thin, AAO 3, falling asleep through my exam  Neuro: somnolent, flapping tremmor  Cardiac: Regular rate and rhythm, no appreciable murmur Gr 2/6 M Pulmonary: Decreased air movement bilateral, some expiratory wheeze Abdomen: Soft, nontender, nondistended Extremities: trace peripheral edema Skin dry, evidence to scratching  Labs: Basic Metabolic Panel:  Recent Labs Lab 03/19/17 1453 03/20/17 0608  NA 141 142  K 2.4* 3.0*  CL 118* 119*  CO2 9* <7*  GLUCOSE 97 44*  BUN 68* 70*  CREATININE 8.46* 7.98*  ALBUMIN 3.1* 2.8*  CALCIUM 5.4* 5.4*   Liver  Function Tests:  Recent Labs Lab 03/19/17 1453 03/20/17 0608  AST 20 17  ALT 13* 11*  ALKPHOS 115 105  BILITOT 0.6 0.7  PROT 6.2* 5.7*  ALBUMIN 3.1* 2.8*    Recent Labs Lab 03/19/17 1453  LIPASE 172*   No results for input(s): AMMONIA in the last 168 hours. CBC:  Recent Labs Lab 03/19/17 1453 03/20/17 0608  WBC 8.7 7.5  HGB 8.7* 8.2*  HCT 27.1* 25.1*  MCV 94.8 95.1  PLT 153 145*   PT/INR: @LABRCNTIP (inr:5) Cardiac Enzymes: )No results for input(s): CKTOTAL, CKMB, CKMBINDEX, TROPONINI in the last 168 hours. CBG: No results for input(s): GLUCAP in the last 168 hours.  Iron Studies: No results for input(s): IRON, TIBC, TRANSFERRIN, FERRITIN in the last 168 hours.  Xrays/Other Studies: Ct Abdomen Pelvis Wo Contrast  Result Date: 03/20/2017 CLINICAL DATA:  Acute onset of generalized abdominal pain, nausea and diarrhea. Initial encounter. EXAM: CT ABDOMEN AND PELVIS WITHOUT CONTRAST TECHNIQUE: Multidetector CT imaging of the abdomen and pelvis was performed following the standard protocol without IV contrast. COMPARISON:  CT of the abdomen and pelvis performed 11/18/2016 FINDINGS: Lower chest: Minimal right basilar atelectasis is noted. The visualized portions of the mediastinum are unremarkable. Hepatobiliary: The liver is unremarkable in appearance. The patient is status post cholecystectomy, with clips noted at the gallbladder fossa. The common bile duct remains normal in caliber. Pancreas: The pancreas is within normal limits. Spleen: The spleen is unremarkable in appearance. Adrenals/Urinary Tract: The adrenal glands are unremarkable in appearance. Nonobstructing bilateral renal stones measure up to 7 mm in size. There is no evidence of hydronephrosis. No obstructing ureteral stones are seen. A right renal cyst is noted. No significant perinephric stranding is appreciated. Stomach/Bowel: The patient's ileocolic anastomosis is unremarkable in appearance. The remaining colon  is within normal limits. Contrast has progressed to the transverse and descending colon. The small bowel is grossly unremarkable. Mild soft tissue inflammation is noted about the gastric fundus, raising question for mild gastritis. Vascular/Lymphatic: Scattered calcification is seen along the abdominal aorta and its branches. The abdominal aorta is otherwise grossly unremarkable. The inferior vena cava is grossly unremarkable. No retroperitoneal lymphadenopathy is seen. No pelvic sidewall lymphadenopathy is identified. Reproductive: The bladder is moderately distended and grossly unremarkable. The patient is status post hysterectomy. No suspicious adnexal masses are seen. Other: No additional soft tissue abnormalities are seen. Musculoskeletal: No acute osseous abnormalities are identified. The visualized musculature is unremarkable in appearance.  IMPRESSION: 1. Mild soft tissue inflammation about the gastric fundus, raising question for mild gastritis. Would correlate with the patient's symptoms. 2. Scattered aortic atherosclerosis. 3. Nonobstructing bilateral renal stones measure up to 7 mm in size. Right renal cysts noted. Electronically Signed   By: Garald Balding M.D.   On: 03/20/2017 03:13   Assessment/Plan: 1. ESRD-  BUN elevated, undetectable bicarb. She will go for HD today. IR consulted for tunneled cath placement.  2. Non anion gap metabolic acidosis 2/2 hyperuricemia dt ESRD and low bicarb dt diarrhea - isotonic na bicarb gtt  Cannot regen bicarb with renal dz aggravating acidosis 3. Mineral metabolic disorder- calcium is low (7.6 when corrected for hypoalbumin)- stop cholecalciferol and calcium- vit D switch to calcium carbonate 2000 mg BID, Phos elevated - start lanthanum and PTH 4. Hypokalemia 2/2 diarrhea- repleting with IV fluids with K and K-dur  5. Anemia of chronic disease - follow-up iron panel, start aranesp  6. Crohns flair vs gastritis - on levaquin and flagyl, checking for c.diff   7. Hypomagnesemia - repleating  8. Hypoglycemia - CBG q2h   Patient severely uremic , acidemic, Needs HD, e'lyte replacement Ledell Noss 03/20/2017, 12:13 PM  I have seen and examined this patient and agree with the plan of care seen, eval, examine, counseled patient and husband,  Discussed with resident. .  Nahum Sherrer L 03/20/2017, 2:38 PM

## 2017-03-20 NOTE — Progress Notes (Signed)
Patient was seen on dialysis and the procedure was supervised.  Patient tolerating procedure. She is pleasant but disoriented; she thought it was 2015 but she knows who the president is.  BFR 350 Via RIJ TC  BP is 133/58.  UF 700 ml (after rinse back ~200) Bath 4K bath.  Patient appears to be tolerating treatment well and only has 1 hr left. No changes; no further seizure like activity although she has coarse tremors. Will need another treatment tomorrow.  Otelia Santee, MD 03/20/2017, 8:17 PM

## 2017-03-20 NOTE — Progress Notes (Signed)
PROGRESS NOTE    Natasha [name]  NFA:[id number] DOB: [birth year] DOA: 03/19/2017 PCP: [name], MD      Brief Narrative:  71 yo F with PMHx significant for Crohns on Humira recently referred to [facility] for new fistula formation, CKD V, baseline Cr 4 not yet on HD who comes in with 1 week abdominal cramps/pain as well as frequent diarrhea admitted for Crohn's flare and Acute on chronic kdiney failure.  CT of the abdomen and pelvis showed only gastritis.  Started on IV fluids, steroids and antibiotics on admission.     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  Probably ischemic injury in setting of diarrhea, Lasix use. -Hold Lasix -IV fluids -Check urinalysis, urine electroltes -Consult Nephrology -Continue Rocaltrol as able   Possible Crohn's flare Gastritis on CT.  Pancreas normal, lipase elevated, doubt pancreatitis clinically.  Suspect more Crohn's. -Clears only -Check stool studies and Cdiff -Hold steroids until these return -Continue Levaquin and Flagyl  Hypocalcemia -Supplement IV -Continue Rocaltrol as able  Hypokalemia -IVF with K  Metabolic acidosis From #1. -Give Bicarb now IV, continue home Bicarb -Consult to Nephrology  COPD -Continue inhalers  Hypothyroidism -Continue levothyroxine  Anemia of chronic renal disease Stable    DVT prophylaxis: SCDs Code Status: FULL Family Communication: None present, lives with husband Disposition Plan: Likely to SNF after discharge.  Will need several more days  admission.   Consultants:   Nephrology  Procedures:   None  Antimicrobials:   Levaquin 10/25 >>  Flagyl 10/25 >>    Subjective: Feeling confused, weak, very weak. Some nausea.  Still nonfocal cramps in abdomen.  No dysopnea, chest pain.    Objective: Vitals:   03/20/17 0015 03/20/17 0045 03/20/17 0140 03/20/17 0531  BP: 105/63 (!) 105/55 (!) 106/48 (!) 100/49  Pulse: 92 82 89 90  Resp: 16 (!) 27 18 20   Temp:   98.2 F (36.8 C) (!) 97.5 F (36.4 C)  TempSrc:   Oral Oral  SpO2: 98% 94% 100% 93%  Weight:   42.6 kg (93 lb 14.7 oz)   Height:   5' 3"  (1.6 m)     Intake/Output Summary (Last 24 hours) at 03/20/17 1053 Last data filed at 03/20/17 1006  Gross per 24 hour  Intake           837.92 ml  Output                0 ml  Net           837.92 ml   Filed Weights   03/20/17 0140  Weight: 42.6 kg (93 lb 14.7 oz)    Examination: General appearance: Elderly adult female, awake but confused.  Extremely weak, listless.  HEENT: Anicteric, conjunctiva pink, lids and lashes normal. No nasal deformity, discharge, epistaxis.  Lips dry.   Skin: Warm and dry.  No suspicious rashes or lesions. Cardiac: Tachcyardic, regular, nl S1-S2, no murmurs appreciated.  No LE edema.  Radial pulses 2+ and symmetric. Respiratory: Normal respiratory rate and rhythm.  CTAB without  rales or wheezes. Abdomen: Abdomen soft.  Mild nonfocal TTP iwhtout guarding. No ascites, distension, hepatosplenomegaly.   MSK: No deformities or effusions. Neuro: Awake but listless, weak.  Strength 5/5 in both extremities.  Speech fluent.  Cranial nerves 3-12 tested and intact. Psych: Sensorium intact and responding to questions, attention diminihsed.  Takes a while to answer even simple questions like, "where are you?".  Judgment and insight appear impaired presently.    Data Reviewed: I have personally reviewed following labs and imaging studies:  CBC:  Recent Labs Lab 03/19/17 1453 03/20/17 0608  WBC  8.7 7.5  HGB 8.7* 8.2*  HCT 27.1* 25.1*  MCV 94.8 95.1  PLT 153 641*   Basic Metabolic Panel:  Recent Labs Lab 03/19/17 1453 03/20/17 0608  NA 141 142  K 2.4* 3.0*  CL 118* 119*  CO2 9* <7*  GLUCOSE 97 44*  BUN 68* 70*  CREATININE 8.46* 7.98*  CALCIUM 5.4* 5.4*   GFR: Estimated Creatinine Clearance: 4.4 mL/min (A) (by C-G formula based on SCr of 7.98 mg/dL (H)). Liver Function Tests:  Recent Labs Lab 03/19/17 1453 03/20/17 0608  AST 20 17  ALT 13* 11*  ALKPHOS 115 105  BILITOT 0.6 0.7  PROT 6.2* 5.7*  ALBUMIN 3.1* 2.8*    Recent Labs Lab 03/19/17 1453  LIPASE 172*   No results for input(s): AMMONIA in the last 168 hours. Coagulation Profile: No results for input(s): INR, PROTIME in the last 168 hours. Cardiac Enzymes: No results for input(s): CKTOTAL, CKMB, CKMBINDEX, TROPONINI in the last 168 hours. BNP (last 3 results)  Recent Labs  07/30/16 1156  PROBNP 272   HbA1C: No results for input(s): HGBA1C in the last 72 hours. CBG: No results for input(s): GLUCAP in the last 168 hours. Lipid Profile: No results for input(s): CHOL, HDL, LDLCALC, TRIG, CHOLHDL, LDLDIRECT in the last 72 hours. Thyroid Function Tests: No results for input(s): TSH, T4TOTAL, FREET4, T3FREE, THYROIDAB in the last 72 hours. Anemia Panel: No results for input(s): VITAMINB12, FOLATE, FERRITIN, TIBC, IRON, RETICCTPCT in the last 72 hours. Urine analysis:    Component Value Date/Time   COLORURINE YELLOW 03/19/2017 1934   APPEARANCEUR HAZY (A) 03/19/2017 1934   LABSPEC 1.006 03/19/2017 1934   PHURINE 5.0 03/19/2017 1934   GLUCOSEU NEGATIVE 03/19/2017 1934   HGBUR MODERATE (A) 03/19/2017 [place] NEGATIVE 03/19/2017 [place] 03/19/2017 1934   PROTEINUR 30 (A) 03/19/2017 1934   UROBILINOGEN 0.2 09/09/2014 0850   NITRITE NEGATIVE 03/19/2017 1934   LEUKOCYTESUR NEGATIVE 03/19/2017 1934   Sepsis Labs: @LABRCNTIP (procalcitonin:4,lacticidven:4)  )No  results found for this or any previous visit (from the past 240 hour(s)).       Radiology Studies: Ct Abdomen Pelvis Wo Contrast  Result Date: 03/20/2017 CLINICAL DATA:  Acute onset of generalized abdominal pain, nausea and diarrhea. Initial encounter. EXAM: CT ABDOMEN AND PELVIS WITHOUT CONTRAST TECHNIQUE: Multidetector CT imaging of the abdomen and pelvis was performed following the standard protocol without IV contrast. COMPARISON:  CT of the abdomen and pelvis performed 11/18/2016 FINDINGS: Lower chest: Minimal right basilar atelectasis is noted. The visualized portions of the mediastinum are unremarkable. Hepatobiliary: The liver is unremarkable in appearance. The patient is status post cholecystectomy, with clips noted at the gallbladder fossa. The common bile duct remains normal in caliber. Pancreas: The pancreas is within normal limits. Spleen: The spleen is unremarkable in appearance. Adrenals/Urinary Tract: The adrenal glands are unremarkable in appearance. Nonobstructing bilateral renal stones measure up  to 7 mm in size. There is no evidence of hydronephrosis. No obstructing ureteral stones are seen. A right renal cyst is noted. No significant perinephric stranding is appreciated. Stomach/Bowel: The patient's ileocolic anastomosis is unremarkable in appearance. The remaining colon is within normal limits. Contrast has progressed to the transverse and descending colon. The small bowel is grossly unremarkable. Mild soft tissue inflammation is noted about the gastric fundus, raising question for mild gastritis. Vascular/Lymphatic: Scattered calcification is seen along the abdominal aorta and its branches. The abdominal aorta is otherwise grossly unremarkable. The inferior vena cava is grossly unremarkable. No retroperitoneal lymphadenopathy is seen. No pelvic sidewall lymphadenopathy is identified. Reproductive: The bladder is moderately distended and grossly unremarkable. The patient is status  post hysterectomy. No suspicious adnexal masses are seen. Other: No additional soft tissue abnormalities are seen. Musculoskeletal: No acute osseous abnormalities are identified. The visualized musculature is unremarkable in appearance. IMPRESSION: 1. Mild soft tissue inflammation about the gastric fundus, raising question for mild gastritis. Would correlate with the patient's symptoms. 2. Scattered aortic atherosclerosis. 3. Nonobstructing bilateral renal stones measure up to 7 mm in size. Right renal cysts noted. Electronically Signed   By: Garald Balding M.D.   On: 03/20/2017 03:13        Scheduled Meds: . calcitRIOL  0.5 mcg Oral Daily  . calcium-vitamin D  1 tablet Oral BID  . cholecalciferol  2,000 Units Oral BID  . [START ON 03/25/2017] cyanocobalamin  1,000 mcg Intramuscular Q8 Weeks  . fluticasone furoate-vilanterol  1 puff Inhalation Daily  . folic acid  1 mg Oral Daily  . iopamidol      . levothyroxine  125 mcg Oral QAC breakfast  . methylPREDNISolone (SOLU-MEDROL) injection  80 mg Intravenous Q12H  . potassium chloride SA  40 mEq Oral BID WC  . sodium bicarbonate  1,950 mg Oral BID  . umeclidinium bromide  1 puff Inhalation Daily   Continuous Infusions: . dextrose 5 % and 0.9 % NaCl with KCl 40 mEq/L 100 mL/hr at 03/20/17 0921  . levofloxacin (LEVAQUIN) IV Stopped (03/20/17 0359)  . metronidazole Stopped (03/20/17 0405)     LOS: 1 day    Time spent: 40 minutes    Edwin Dada, MD [facility] Pager [phone]  If 7PM-7AM, please contact night-coverage [email or web address] Password [email or web address] 03/20/2017, 10:53 AM

## 2017-03-20 NOTE — Progress Notes (Signed)
Rapid Response MD note Called to bedside for patient seizure.  Per husband, she was walking to bathroom, started to shake.  He walked her back to bed, where she began foaming at mouth, clenching teeth and having generalized clonic jerks.  This lasted 1 minute, resolved on its own.    BP 117/62 (BP Location: Right Arm)   Pulse 97   Temp 98.2 F (36.8 C) (Oral)   Resp 18   Ht 5' 3"  (1.6 m)   Wt 42.6 kg (93 lb 14.7 oz)   SpO2 100%   BMI 16.64 kg/m   Patient sleepy, snoring.  No further seizure activity.   Seizure provoked from multiple metabolic derangements.  Ca and Mag currently infusing.  Glucose normal.  Hemodynamcially stable. Discussed with nephrology, discussed with IR, they will triage their workload in light of this seizure.   Dialyze asap when catheter placed. Continue Ca Mag Seizure precautions and loraz if needed If further seizures, will discuss with Neuro

## 2017-03-21 ENCOUNTER — Encounter (HOSPITAL_COMMUNITY): Payer: Self-pay

## 2017-03-21 DIAGNOSIS — N186 End stage renal disease: Secondary | ICD-10-CM

## 2017-03-21 LAB — RENAL FUNCTION PANEL
ANION GAP: 9 (ref 5–15)
Albumin: 2.2 g/dL — ABNORMAL LOW (ref 3.5–5.0)
BUN: 35 mg/dL — ABNORMAL HIGH (ref 6–20)
CO2: 18 mmol/L — AB (ref 22–32)
Calcium: 5.9 mg/dL — CL (ref 8.9–10.3)
Chloride: 115 mmol/L — ABNORMAL HIGH (ref 101–111)
Creatinine, Ser: 4.78 mg/dL — ABNORMAL HIGH (ref 0.44–1.00)
GFR calc non Af Amer: 8 mL/min — ABNORMAL LOW (ref >60)
GFR, EST AFRICAN AMERICAN: 10 mL/min — AB (ref >60)
Glucose, Bld: 113 mg/dL — ABNORMAL HIGH (ref 65–99)
POTASSIUM: 2.6 mmol/L — AB (ref 3.5–5.1)
Phosphorus: 2.5 mg/dL (ref 2.5–4.6)
Sodium: 142 mmol/L (ref 135–145)

## 2017-03-21 LAB — BASIC METABOLIC PANEL
Anion gap: 8 (ref 5–15)
BUN: 13 mg/dL (ref 6–20)
CHLORIDE: 112 mmol/L — AB (ref 101–111)
CO2: 24 mmol/L (ref 22–32)
CREATININE: 3.04 mg/dL — AB (ref 0.44–1.00)
Calcium: 7 mg/dL — ABNORMAL LOW (ref 8.9–10.3)
GFR calc Af Amer: 17 mL/min — ABNORMAL LOW (ref >60)
GFR calc non Af Amer: 15 mL/min — ABNORMAL LOW (ref >60)
Glucose, Bld: 128 mg/dL — ABNORMAL HIGH (ref 65–99)
POTASSIUM: 2.8 mmol/L — AB (ref 3.5–5.1)
Sodium: 144 mmol/L (ref 135–145)

## 2017-03-21 LAB — HEPATITIS C ANTIBODY (REFLEX): HCV Ab: 0.2 s/co ratio (ref 0.0–0.9)

## 2017-03-21 LAB — URINE CULTURE

## 2017-03-21 LAB — MAGNESIUM
Magnesium: 1.6 mg/dL — ABNORMAL LOW (ref 1.7–2.4)
Magnesium: 1.8 mg/dL (ref 1.7–2.4)

## 2017-03-21 LAB — GLUCOSE, CAPILLARY
GLUCOSE-CAPILLARY: 102 mg/dL — AB (ref 65–99)
GLUCOSE-CAPILLARY: 103 mg/dL — AB (ref 65–99)
GLUCOSE-CAPILLARY: 104 mg/dL — AB (ref 65–99)
GLUCOSE-CAPILLARY: 105 mg/dL — AB (ref 65–99)
GLUCOSE-CAPILLARY: 129 mg/dL — AB (ref 65–99)
Glucose-Capillary: 92 mg/dL (ref 65–99)
Glucose-Capillary: 83 mg/dL (ref 65–99)
Glucose-Capillary: 109 mg/dL — ABNORMAL HIGH (ref 65–99)

## 2017-03-21 LAB — HEPATITIS B CORE ANTIBODY, IGM: Hep B C IgM: NEGATIVE

## 2017-03-21 LAB — PARATHYROID HORMONE, INTACT (NO CA): PTH: 140 pg/mL — ABNORMAL HIGH (ref 15–65)

## 2017-03-21 LAB — HEPATITIS B SURFACE ANTIBODY,QUALITATIVE: Hep B S Ab: NONREACTIVE

## 2017-03-21 LAB — HEPATITIS B CORE ANTIBODY, TOTAL: Hep B Core Total Ab: NEGATIVE

## 2017-03-21 LAB — HCV COMMENT:

## 2017-03-21 LAB — HEPATITIS B SURFACE ANTIGEN: HEP B S AG: NEGATIVE

## 2017-03-21 MED ORDER — OXYCODONE HCL 5 MG PO TABS
2.5000 mg | ORAL_TABLET | Freq: Four times a day (QID) | ORAL | Status: DC | PRN
  Administered 2017-03-21 – 2017-03-26 (×7): 5 mg via ORAL
  Filled 2017-03-21 (×7): qty 1

## 2017-03-21 MED ORDER — POTASSIUM CHLORIDE CRYS ER 20 MEQ PO TBCR
20.0000 meq | EXTENDED_RELEASE_TABLET | Freq: Once | ORAL | Status: AC
  Administered 2017-03-21: 20 meq via ORAL
  Filled 2017-03-21: qty 1

## 2017-03-21 MED ORDER — SODIUM CHLORIDE 0.9 % IV SOLN
2.0000 g | Freq: Once | INTRAVENOUS | Status: AC
  Administered 2017-03-21: 2 g via INTRAVENOUS
  Filled 2017-03-21: qty 20

## 2017-03-21 MED ORDER — CALCITRIOL 0.5 MCG PO CAPS
2.0000 ug | ORAL_CAPSULE | Freq: Every day | ORAL | Status: DC
  Administered 2017-03-21 – 2017-03-27 (×8): 2 ug via ORAL
  Filled 2017-03-21: qty 8
  Filled 2017-03-21 (×6): qty 4
  Filled 2017-03-21: qty 8

## 2017-03-21 MED ORDER — MAGNESIUM SULFATE 2 GM/50ML IV SOLN
2.0000 g | Freq: Once | INTRAVENOUS | Status: AC
  Administered 2017-03-21: 2 g via INTRAVENOUS
  Filled 2017-03-21: qty 50

## 2017-03-21 MED ORDER — POTASSIUM CHLORIDE CRYS ER 20 MEQ PO TBCR
40.0000 meq | EXTENDED_RELEASE_TABLET | Freq: Once | ORAL | Status: AC
  Administered 2017-03-21: 40 meq via ORAL
  Filled 2017-03-21: qty 2

## 2017-03-21 MED ORDER — DARBEPOETIN ALFA 200 MCG/0.4ML IJ SOSY
PREFILLED_SYRINGE | INTRAMUSCULAR | Status: AC
  Administered 2017-03-21: 200 ug via INTRAVENOUS
  Filled 2017-03-21: qty 0.4

## 2017-03-21 MED ORDER — DARBEPOETIN ALFA 200 MCG/0.4ML IJ SOSY
200.0000 ug | PREFILLED_SYRINGE | INTRAMUSCULAR | Status: DC
  Administered 2017-03-21: 200 ug via INTRAVENOUS
  Filled 2017-03-21: qty 0.4

## 2017-03-21 NOTE — Progress Notes (Addendum)
PROGRESS NOTE    Natasha [name]  FHL:[id number] DOB: March 26, 1946 DOA: 03/19/2017 PCP: [name], MD      Brief Narrative:  71 yo F with PMHx significant for Crohns on Humira recently referred to [facility] for new fistula formation, CKD V, baseline Cr 4 not yet on HD who comes in with 1 week abdominal cramps/pain as well as frequent diarrhea admitted for Crohn's flare and Acute on chronic kdiney failure.  CT of the abdomen and pelvis showed only gastritis.  Started on IV fluids, steroids and antibiotics on admission.    Overnight, condition worsened.  Severe hypoCa, hypoMag and weakness.  She developed a seizure that aborted spontaneously after a nminute.  Had a tunneled catheter placed and went to HD that afternoon.     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  Probably ischemic injury in setting of diarrhea, Lasix use. -HD per Nephrology  -Consult Nephrology   Possible Crohn's flare Gastritis on CT.  Pancreas normal, lipase elevated, doubt pancreatitis clinically.  Suspect more Crohn's. No diarrhea or BM at all in hospital, not consistent with C diff.  Abdominal pain better.  Suspect this may all have been uremia from AKI. -Stop steroids and antibiotics both -Will discuss with GI  Seizure Provoked in setting of uremia, electrolyte derangements.   No previous epilepsy. -Continue seizure precautions, and loraz available for abort seizures -Consult Neurology if another seizure  Hypocalcemia and Hypokalemia and Hypomagnesemia Per Neph and  HD -IV Mag  COPD -Continue inhalers  Hypothyroidism -Continue levothyroxine  Anemia of chronic renal disease Stable    DVT prophylaxis: SCDs Code Status: FULL Family Communication: None present, lives with husband Disposition Plan: Likely to SNF after discharge.  Will need several more days admission.   Consultants:   Nephrology  Procedures:   None  Antimicrobials:   Levaquin 10/25 >> 10/27  Flagyl 10/25 >>  10/27   Subjective: Feeling better.  Still hurts "all over", but probably most at port and in legs, which feel "heavy" and weak.  No chest pain.  No diarrhea.  No hematochezia.  No more vomiting.  Objective: Vitals:   03/21/17 0339 03/21/17 0528 03/21/17 0808 03/21/17 1140  BP: (!) 106/51  (!) 163/61 (!) 154/58  Pulse: (!) 107     Resp: 16     Temp: 98.1 F (36.7 C)  98.3 F (36.8 C) 98.3 F (36.8 C)  TempSrc: Oral   Oral  SpO2: 96%     Weight:  45.6 kg (100 lb 8.5 oz)    Height:        Intake/Output Summary (Last 24 hours) at 03/21/17 1145 Last data filed at 03/21/17 1000  Gross per 24 hour  Intake          1328.33 ml  Output                0 ml  Net          1328.33 ml   Filed Weights   03/20/17 1808 03/20/17 2042 03/21/17 0528  Weight: 42.8 kg (  94 lb 5.7 oz) 42.8 kg (94 lb 5.7 oz) 45.6 kg (100 lb 8.5 oz)    Examination: General appearance: Elderly adult female, awake, weak, alert. HEENT: Anicteric, conjunctiva pink, lids and lashes normal. No nasal deformity, discharge, epistaxis.  Lips dry.   Skin: Warm and dry.  No suspicious rashes or lesions. Cardiac: RRR, nl S1-S2, no murmurs appreciated.  No LE edema.  Radial pulses 2+ and symmetric. Respiratory: Normal respiratory rate and rhythm.  CTAB without rales or wheezes. Abdomen: Abdomen soft.  Mild nonfocal TTP. No ascites, distension, hepatosplenomegaly.   MSK: No deformities or effusions. Neuro: Awake but weak.  Strength 5/5 in both extremities.  Speech fluent.   Psych: Sensorium  intact and responding to questions, attention improved.     Data Reviewed: I have personally reviewed following labs and imaging studies:  CBC:  Recent Labs Lab 03/19/17 1453 03/20/17 0608  WBC 8.7 7.5  HGB 8.7* 8.2*  HCT 27.1* 25.1*  MCV 94.8 95.1  PLT 153 355*   Basic Metabolic Panel:  Recent Labs Lab 03/19/17 1453 03/20/17 0608 03/20/17 1125 03/20/17 1230 03/21/17 0256  NA 141 142  --   --  142  K 2.4* 3.0*  --   --  2.6*  CL 118* 119*  --   --  115*  CO2 9* <7*  --   --  18*  GLUCOSE 97 44*  --   --  113*  BUN 68* 70*  --   --  35*  CREATININE 8.46* 7.98*  --   --  4.78*  CALCIUM 5.4* 5.4*  --   --  5.9*  MG  --   --  0.8*  --  1.6*  PHOS  --   --   --  5.7* 2.5   GFR: Estimated Creatinine Clearance: 7.9 mL/min (A) (by C-G formula based on SCr of 4.78 mg/dL (H)). Liver Function Tests:  Recent Labs Lab 03/19/17 1453 03/20/17 0608 03/21/17 0256  AST 20 17  --   ALT 13* 11*  --   ALKPHOS 115 105  --   BILITOT 0.6 0.7  --   PROT 6.2* 5.7*  --   ALBUMIN 3.1* 2.8* 2.2*    Recent Labs Lab 03/19/17 1453  LIPASE 172*   No results for input(s): AMMONIA in the last 168 hours. Coagulation Profile:  Recent Labs Lab 03/20/17 1230  INR 1.16   Cardiac Enzymes: No results for input(s): CKTOTAL, CKMB, CKMBINDEX, TROPONINI in the last 168 hours. BNP (last 3 results)  Recent Labs  07/30/16 1156  PROBNP 272   HbA1C: No results for input(s): HGBA1C in the last 72 hours. CBG:  Recent Labs Lab 03/21/17 0202 03/21/17 0337 03/21/17 0554 03/21/17 0811 03/21/17 1142  GLUCAP 102* 104* 105* 83 109*   Lipid Profile: No results for input(s): CHOL, HDL, LDLCALC, TRIG, CHOLHDL, LDLDIRECT in the last 72 hours. Thyroid Function Tests: No results for input(s): TSH, T4TOTAL, FREET4, T3FREE, THYROIDAB in the last 72 hours. Anemia Panel:  Recent Labs  03/20/17 1230  TIBC 223*  IRON 91   Urine analysis:    Component Value Date/Time   COLORURINE  YELLOW 03/19/2017 1934   APPEARANCEUR HAZY (A) 03/19/2017 1934   LABSPEC 1.006 03/19/2017 1934   PHURINE 5.0 03/19/2017 1934   GLUCOSEU NEGATIVE 03/19/2017 1934   HGBUR MODERATE (A) 03/19/2017 1934   BILIRUBINUR NEGATIVE 03/19/2017 [place] 03/19/2017 1934   PROTEINUR 30 (A) 03/19/2017 1934   UROBILINOGEN 0.2 09/09/2014  [place] 03/19/2017 1934   LEUKOCYTESUR NEGATIVE 03/19/2017 1934   Sepsis Labs: @LABRCNTIP (procalcitonin:4,lacticidven:4)  ) Recent Results (from the past 240 hour(s))  Culture, blood (routine x 2)     Status: None (Preliminary result)   Collection Time: 03/19/17  4:20 PM  Result Value Ref Range Status   Specimen Description BLOOD RIGHT ANTECUBITAL  Final   Special Requests   Final    BOTTLES DRAWN AEROBIC AND ANAEROBIC Blood Culture adequate volume   Culture NO GROWTH 2 DAYS  Final   Report Status PENDING  Incomplete  MRSA PCR Screening     Status: None   Collection Time: 03/20/17  5:01 PM  Result Value Ref Range Status   MRSA by PCR NEGATIVE NEGATIVE Final    Comment:        The GeneXpert MRSA Assay (FDA approved for NASAL specimens only), is one component of a comprehensive MRSA colonization surveillance program. It is not intended to diagnose MRSA infection nor to guide or monitor treatment for MRSA infections.          Radiology Studies: Ct Abdomen Pelvis Wo Contrast  Result Date: 03/20/2017 CLINICAL DATA:  Acute onset of generalized abdominal pain, nausea and diarrhea. Initial encounter. EXAM: CT ABDOMEN AND PELVIS WITHOUT CONTRAST TECHNIQUE: Multidetector CT imaging of the abdomen and pelvis was performed following the standard protocol without IV contrast. COMPARISON:  CT of the abdomen and pelvis performed 11/18/2016 FINDINGS: Lower chest: Minimal right basilar atelectasis is noted. The visualized portions of the mediastinum are unremarkable. Hepatobiliary: The liver is unremarkable in appearance. The patient  is status post cholecystectomy, with clips noted at the gallbladder fossa. The common bile duct remains normal in caliber. Pancreas: The pancreas is within normal limits. Spleen: The spleen is unremarkable in appearance. Adrenals/Urinary Tract: The adrenal glands are unremarkable in appearance. Nonobstructing bilateral renal stones measure up to 7 mm in size. There is no evidence of hydronephrosis. No obstructing ureteral stones are seen. A right renal cyst is noted. No significant perinephric stranding is appreciated. Stomach/Bowel: The patient's ileocolic anastomosis is unremarkable in appearance. The remaining colon is within normal limits. Contrast has progressed to the transverse and descending colon. The small bowel is grossly unremarkable. Mild soft tissue inflammation is noted about the gastric fundus, raising question for mild gastritis. Vascular/Lymphatic: Scattered calcification is seen along the abdominal aorta and its branches. The abdominal aorta is otherwise grossly unremarkable. The inferior vena cava is grossly unremarkable. No retroperitoneal lymphadenopathy is seen. No pelvic sidewall lymphadenopathy is identified. Reproductive: The bladder is moderately distended and grossly unremarkable. The patient is status post hysterectomy. No suspicious adnexal masses are seen. Other: No additional soft tissue abnormalities are seen. Musculoskeletal: No acute osseous abnormalities are identified. The visualized musculature is unremarkable in appearance. IMPRESSION: 1. Mild soft tissue inflammation about the gastric fundus, raising question for mild gastritis. Would correlate with the patient's symptoms. 2. Scattered aortic atherosclerosis. 3. Nonobstructing bilateral renal stones measure up to 7 mm in size. Right renal cysts noted. Electronically Signed   By: Garald Balding M.D.   On: 03/20/2017 03:13        Scheduled Meds: . calcitRIOL  2 mcg Oral Daily  . calcium carbonate  2 tablet Oral BID WC   . [START ON 03/25/2017] cyanocobalamin  1,000 mcg Intramuscular Q8 Weeks  . darbepoetin (ARANESP) injection - DIALYSIS  200 mcg Intravenous Q Sat-HD  . fluticasone furoate-vilanterol  1 puff Inhalation Daily  . folic acid  1  mg Oral Daily  . levothyroxine  125 mcg Oral QAC breakfast  . potassium chloride SA  40 mEq Oral BID WC  . umeclidinium bromide  1 puff Inhalation Daily   Continuous Infusions: . levofloxacin (LEVAQUIN) IV Stopped (03/20/17 0359)  . metronidazole Stopped (03/21/17 0700)  . sodium bicarbonate 150 mEq with 40 mEq potassium chloride in sterile water 1,000 mL infusion 50 mL/hr at 03/20/17 1412     LOS: 2 days    Time spent: 25 minutes    Edwin Dada, MD [facility] Pager [phone]  If 7PM-7AM, please contact night-coverage [email or web address] Password [email or web address] 03/21/2017, 11:45 AM

## 2017-03-21 NOTE — Progress Notes (Addendum)
Initial Nutrition Assessment  DOCUMENTATION CODES:   Severe malnutrition in context of chronic illness, Underweight  INTERVENTION:    Mighty Shake II TID with meals, each supplement provides 480-500 kcals and 20-23 grams of protein  NUTRITION DIAGNOSIS:   Severe Malnutrition related to chronic illness (Crohn's disease, CKD) as evidenced by severe fat depletion, severe muscle depletion.  GOAL:   Patient will meet greater than or equal to 90% of their needs  MONITOR:   PO intake, Supplement acceptance, Weight trends, Labs  REASON FOR ASSESSMENT:   Malnutrition Screening Tool    ASSESSMENT:   71 year old female with PMH of Crohn's DZ with anal fistula, CKD, PVD, COPD, SBO, pancreatitis, hypothyroidism, HTN, and GERD who was admitted on 10/25 with Crohn's flare-up.  Spoke with patient and her "very good friend" about nutrition hx. Patient typically eats very well. She has had declining renal function over the past 2 years and has been steadily losing weight. Usual weight 130 lbs 2 years ago; after hydration on admission, up to 100 lbs. Over the past week, she has consumed minimal amounts of fluid and food. She has tried Ensure and Boost supplements in the past and they gave her diarrhea; she thinks this was related to too much magnesium in them. Suspect diarrhea was related to GI distress with Crohn's disease. She tolerates milk okay and agreed to try mighty shake supplements with meals.   HD catheter has been placed. Patient received HD yesterday, plans for another treatment today.  Labs reviewed. Potassium 2.6 (L), magnesium 1.6 (L) CBG's: 102-104-105-83 Medications reviewed and include [place], vitamin E-33, folic acid, KCl, antibiotics.  Fluid status + 1.8 L since admission  NUTRITION - FOCUSED PHYSICAL EXAM:    Most Recent Value  Orbital Region  No depletion  Upper Arm Region  Severe depletion  Thoracic and Lumbar Region  Severe depletion  Buccal Region  No depletion   Temple Region  No depletion  Clavicle Bone Region  Moderate depletion  Clavicle and Acromion Bone Region  Severe depletion  Scapular Bone Region  Moderate depletion  Dorsal Hand  Moderate depletion  Patellar Region  Severe depletion  Anterior Thigh Region  Severe depletion  Posterior Calf Region  Severe depletion  Edema (RD Assessment)  Mild  Hair  Reviewed  Eyes  Reviewed  Mouth  Reviewed  Skin  Reviewed  Nails  Reviewed       Diet Order:  Diet renal with fluid restriction Fluid restriction: 1200 mL Fluid; Room service appropriate? Yes; Fluid consistency: Thin  EDUCATION NEEDS:   Not appropriate for education at this time  Skin:  Skin Assessment: Skin Integrity Issues: Skin Integrity Issues:: Other (Comment) Other: MASD to sacrum  Last BM:  10/25  Height:   Ht Readings from Last 1 Encounters:  03/20/17 5' 3"  (1.6 m)    Weight:   Wt Readings from Last 1 Encounters:  03/21/17 100 lb 8.5 oz (45.6 kg)    Ideal Body Weight:  52.3 kg  BMI:  Body mass index is 17.81 kg/m.  Estimated Nutritional Needs:   Kcal:  1500-1700  Protein:  75-90 gm  Fluid:  1.5-1.7 L    Molli Barrows, RD, LDN, [place] Pager [phone] After Hours Pager [phone]

## 2017-03-21 NOTE — Progress Notes (Signed)
Subjective: Interval History: has complaints sore at cath site.  Feels better overall.  Objective: Vital signs in last 24 hours: Temp:  [97.7 F (36.5 C)-98.7 F (37.1 C)] 98.3 F (36.8 C) (10/27 0808) Pulse Rate:  [97-108] 107 (10/27 0339) Resp:  [13-22] 16 (10/27 0339) BP: (93-163)/(46-74) 163/61 (10/27 0808) SpO2:  [95 %-100 %] 96 % (10/27 0339) Weight:  [42.8 kg (94 lb 5.7 oz)-45.6 kg (100 lb 8.5 oz)] 45.6 kg (100 lb 8.5 oz) (10/27 0528) Weight change: 0.2 kg (7.1 oz)  Intake/Output from previous day: 10/26 0701 - 10/27 0700 In: 1308.3 [P.O.:280; I.V.:878.3; IV Piggyback:150] Out: -  Intake/Output this shift: No intake/output data recorded.  General appearance: alert, cooperative, no distress and pale Resp: clear to auscultation bilaterally Chest wall: RIJ cath PC Cardio: S1, S2 normal and systolic murmur: systolic ejection 2/6, decrescendo at 2nd left intercostal space GI: soft, non-tender; bowel sounds normal; no masses,  no organomegaly Extremities: edema 1-2+  Lab Results:  Recent Labs  03/19/17 1453 03/20/17 0608  WBC 8.7 7.5  HGB 8.7* 8.2*  HCT 27.1* 25.1*  PLT 153 145*   BMET:  Recent Labs  03/20/17 0608 03/21/17 0256  NA 142 142  K 3.0* 2.6*  CL 119* 115*  CO2 <7* 18*  GLUCOSE 44* 113*  BUN 70* 35*  CREATININE 7.98* 4.78*  CALCIUM 5.4* 5.9*   No results for input(s): PTH in the last 72 hours. Iron Studies:  Recent Labs  03/20/17 1230  IRON 91  TIBC 223*    Studies/Results: Ct Abdomen Pelvis Wo Contrast  Result Date: 03/20/2017 CLINICAL DATA:  Acute onset of generalized abdominal pain, nausea and diarrhea. Initial encounter. EXAM: CT ABDOMEN AND PELVIS WITHOUT CONTRAST TECHNIQUE: Multidetector CT imaging of the abdomen and pelvis was performed following the standard protocol without IV contrast. COMPARISON:  CT of the abdomen and pelvis performed 11/18/2016 FINDINGS: Lower chest: Minimal right basilar atelectasis is noted. The visualized  portions of the mediastinum are unremarkable. Hepatobiliary: The liver is unremarkable in appearance. The patient is status post cholecystectomy, with clips noted at the gallbladder fossa. The common bile duct remains normal in caliber. Pancreas: The pancreas is within normal limits. Spleen: The spleen is unremarkable in appearance. Adrenals/Urinary Tract: The adrenal glands are unremarkable in appearance. Nonobstructing bilateral renal stones measure up to 7 mm in size. There is no evidence of hydronephrosis. No obstructing ureteral stones are seen. A right renal cyst is noted. No significant perinephric stranding is appreciated. Stomach/Bowel: The patient's ileocolic anastomosis is unremarkable in appearance. The remaining colon is within normal limits. Contrast has progressed to the transverse and descending colon. The small bowel is grossly unremarkable. Mild soft tissue inflammation is noted about the gastric fundus, raising question for mild gastritis. Vascular/Lymphatic: Scattered calcification is seen along the abdominal aorta and its branches. The abdominal aorta is otherwise grossly unremarkable. The inferior vena cava is grossly unremarkable. No retroperitoneal lymphadenopathy is seen. No pelvic sidewall lymphadenopathy is identified. Reproductive: The bladder is moderately distended and grossly unremarkable. The patient is status post hysterectomy. No suspicious adnexal masses are seen. Other: No additional soft tissue abnormalities are seen. Musculoskeletal: No acute osseous abnormalities are identified. The visualized musculature is unremarkable in appearance. IMPRESSION: 1. Mild soft tissue inflammation about the gastric fundus, raising question for mild gastritis. Would correlate with the patient's symptoms. 2. Scattered aortic atherosclerosis. 3. Nonobstructing bilateral renal stones measure up to 7 mm in size. Right renal cysts noted. Electronically Signed   By: Jacqulynn Cadet  Chang M.D.   On: 03/20/2017  03:13    I have reviewed the patient's current medications.  Assessment/Plan: 1 ESRD less uremic  HD today again to stabilize.  Use high Ca, K bath 2 Low K repleting 3 Low Mg Crohns, give iv 4 Low Ca- Ca po and vit D, and lower phos 5 Anemia esa 6 HPTH vitD 7 Crohns 8 Enceph better 9 SZ metabolic with Mg, Ca, uremia P HD, esa, K, Mg, Ca, Vit D    LOS: 2 days   Mitul Hallowell L 03/21/2017,8:24 AM

## 2017-03-21 NOTE — Progress Notes (Signed)
Transported to HD unit by bed. Stable. Report given to RN.

## 2017-03-21 NOTE — Procedures (Signed)
I was present at this session.  I have reviewed the session itself and made appropriate changes.  Hd via PC , tol well , feeling much better.  Dannah Ryles L 10/27/20186:46 PM

## 2017-03-22 DIAGNOSIS — K5090 Crohn's disease, unspecified, without complications: Secondary | ICD-10-CM

## 2017-03-22 LAB — COMPREHENSIVE METABOLIC PANEL
ALBUMIN: 2.4 g/dL — AB (ref 3.5–5.0)
ALT: 12 U/L — ABNORMAL LOW (ref 14–54)
ANION GAP: 7 (ref 5–15)
AST: 19 U/L (ref 15–41)
Alkaline Phosphatase: 71 U/L (ref 38–126)
BUN: 13 mg/dL (ref 6–20)
CALCIUM: 7 mg/dL — AB (ref 8.9–10.3)
CHLORIDE: 111 mmol/L (ref 101–111)
CO2: 26 mmol/L (ref 22–32)
Creatinine, Ser: 3.19 mg/dL — ABNORMAL HIGH (ref 0.44–1.00)
GFR calc non Af Amer: 14 mL/min — ABNORMAL LOW (ref >60)
GFR, EST AFRICAN AMERICAN: 16 mL/min — AB (ref >60)
GLUCOSE: 85 mg/dL (ref 65–99)
POTASSIUM: 3.1 mmol/L — AB (ref 3.5–5.1)
SODIUM: 144 mmol/L (ref 135–145)
Total Bilirubin: 0.7 mg/dL (ref 0.3–1.2)
Total Protein: 4.6 g/dL — ABNORMAL LOW (ref 6.5–8.1)

## 2017-03-22 LAB — PREPARE RBC (CROSSMATCH)

## 2017-03-22 LAB — RENAL FUNCTION PANEL
ALBUMIN: 2.4 g/dL — AB (ref 3.5–5.0)
ANION GAP: 8 (ref 5–15)
Albumin: 2.4 g/dL — ABNORMAL LOW (ref 3.5–5.0)
Anion gap: 7 (ref 5–15)
BUN: 14 mg/dL (ref 6–20)
BUN: 13 mg/dL (ref 6–20)
CALCIUM: 7 mg/dL — AB (ref 8.9–10.3)
CO2: 25 mmol/L (ref 22–32)
CO2: 25 mmol/L (ref 22–32)
Calcium: 6.8 mg/dL — ABNORMAL LOW (ref 8.9–10.3)
Chloride: 112 mmol/L — ABNORMAL HIGH (ref 101–111)
Chloride: 110 mmol/L (ref 101–111)
Creatinine, Ser: 3.65 mg/dL — ABNORMAL HIGH (ref 0.44–1.00)
Creatinine, Ser: 3.82 mg/dL — ABNORMAL HIGH (ref 0.44–1.00)
GFR calc Af Amer: 13 mL/min — ABNORMAL LOW (ref >60)
GFR, EST AFRICAN AMERICAN: 14 mL/min — AB (ref >60)
GFR, EST NON AFRICAN AMERICAN: 12 mL/min — AB (ref >60)
GFR, EST NON AFRICAN AMERICAN: 11 mL/min — AB (ref >60)
Glucose, Bld: 123 mg/dL — ABNORMAL HIGH (ref 65–99)
Glucose, Bld: 94 mg/dL (ref 65–99)
POTASSIUM: 3.2 mmol/L — AB (ref 3.5–5.1)
Phosphorus: 1.1 mg/dL — ABNORMAL LOW (ref 2.5–4.6)
Phosphorus: 1.4 mg/dL — ABNORMAL LOW (ref 2.5–4.6)
Potassium: 3.4 mmol/L — ABNORMAL LOW (ref 3.5–5.1)
Sodium: 145 mmol/L (ref 135–145)
Sodium: 142 mmol/L (ref 135–145)

## 2017-03-22 LAB — CBC
HEMATOCRIT: 20.2 % — AB (ref 36.0–46.0)
Hemoglobin: 6.5 g/dL — CL (ref 12.0–15.0)
MCH: 30.4 pg (ref 26.0–34.0)
MCHC: 32.2 g/dL (ref 30.0–36.0)
MCV: 94.4 fL (ref 78.0–100.0)
Platelets: 122 10*3/uL — ABNORMAL LOW (ref 150–400)
RBC: 2.14 MIL/uL — ABNORMAL LOW (ref 3.87–5.11)
RDW: 15.9 % — AB (ref 11.5–15.5)
WBC: 7.3 10*3/uL (ref 4.0–10.5)

## 2017-03-22 LAB — GLUCOSE, CAPILLARY
GLUCOSE-CAPILLARY: 81 mg/dL (ref 65–99)
GLUCOSE-CAPILLARY: 102 mg/dL — AB (ref 65–99)
Glucose-Capillary: 107 mg/dL — ABNORMAL HIGH (ref 65–99)

## 2017-03-22 MED ORDER — HEPARIN SODIUM (PORCINE) 1000 UNIT/ML DIALYSIS: 1000.0000 [IU] | INTRAMUSCULAR | Status: DC | PRN

## 2017-03-22 MED ORDER — SODIUM CHLORIDE 0.9 % IV SOLN: 100.0000 mL | INTRAVENOUS | Status: DC | PRN

## 2017-03-22 MED ORDER — LIDOCAINE HCL (PF) 1 % IJ SOLN: 5.0000 mL | INTRAMUSCULAR | Status: DC | PRN

## 2017-03-22 MED ORDER — LIDOCAINE-PRILOCAINE 2.5-2.5 % EX CREA: 1.0000 "application " | TOPICAL_CREAM | CUTANEOUS | Status: DC | PRN

## 2017-03-22 MED ORDER — SODIUM CHLORIDE 0.9 % IV SOLN
100.0000 mL | INTRAVENOUS | Status: DC | PRN
Start: 2017-03-22 — End: 2017-03-25

## 2017-03-22 MED ORDER — PENTAFLUOROPROP-TETRAFLUOROETH EX AERO: 1.0000 "application " | INHALATION_SPRAY | CUTANEOUS | Status: DC | PRN

## 2017-03-22 MED ORDER — ALTEPLASE 2 MG IJ SOLR: 2.0000 mg | Freq: Once | INTRAMUSCULAR | Status: DC | PRN

## 2017-03-22 MED ORDER — SODIUM CHLORIDE 0.9 % IV SOLN: Freq: Once | INTRAVENOUS | Status: DC

## 2017-03-22 MED ORDER — HEPARIN SODIUM (PORCINE) 1000 UNIT/ML DIALYSIS: 40.0000 [IU]/kg | Freq: Once | INTRAMUSCULAR | Status: DC

## 2017-03-22 MED ORDER — POTASSIUM CHLORIDE CRYS ER 20 MEQ PO TBCR
20.0000 meq | EXTENDED_RELEASE_TABLET | Freq: Every day | ORAL | Status: DC
  Administered 2017-03-22 – 2017-03-24 (×3): 20 meq via ORAL
  Filled 2017-03-22 (×3): qty 1

## 2017-03-22 MED ORDER — RENA-VITE PO TABS
1.0000 | ORAL_TABLET | Freq: Every day | ORAL | Status: DC
  Administered 2017-03-23 – 2017-03-26 (×4): 1 via ORAL
  Filled 2017-03-22 (×5): qty 1

## 2017-03-22 MED ORDER — CEFAZOLIN SODIUM-DEXTROSE 2-4 GM/100ML-% IV SOLN: 2.0000 g | INTRAVENOUS | Status: AC

## 2017-03-22 NOTE — Progress Notes (Signed)
CRITICAL VALUE ALERT  Critical Value: HG  6.5  Date & Time Notied:  03/22/17 @ 13:36  Provider Notified: Norina Buzzard

## 2017-03-22 NOTE — Progress Notes (Signed)
MD made aware of yesterday's magnesium level-1.8. No order given.

## 2017-03-22 NOTE — Progress Notes (Signed)
Subjective: Interval History: has complaints weak, cannot walk.  Objective: Vital signs in last 24 hours: Temp:  [97.4 F (36.3 C)-99.4 F (37.4 C)] 99.4 F (37.4 C) (10/28 0725) Pulse Rate:  [81-101] 101 (10/28 0725) Resp:  [17-93] 23 (10/28 0725) BP: (98-163)/(54-70) 126/58 (10/28 0725) SpO2:  [94 %-99 %] 94 % (10/28 0725) Weight:  [46.8 kg (103 lb 2.8 oz)] 46.8 kg (103 lb 2.8 oz) (10/28 0300) Weight change: 4 kg (8 lb 13.1 oz)  Intake/Output from previous day: 10/27 0701 - 10/28 0700 In: 1440 [P.O.:240; I.V.:1150; IV Piggyback:50] Out: 400 [Urine:400] Intake/Output this shift: No intake/output data recorded.  General appearance: alert, cooperative, cachectic and no distress Resp: diminished breath sounds bibasilar Chest wall: RIJ cath Cardio: S1, S2 normal and systolic murmur: systolic ejection 2/6, crescendo at 2nd left intercostal space GI: soft, non-tender; bowel sounds normal; no masses,  no organomegaly Extremities: AVF LUA  Lab Results:  Recent Labs  03/19/17 1453 03/20/17 0608  WBC 8.7 7.5  HGB 8.7* 8.2*  HCT 27.1* 25.1*  PLT 153 145*   BMET:  Recent Labs  03/21/17 2135 03/22/17 0142  NA 144 144  K 2.8* 3.1*  CL 112* 111  CO2 24 26  GLUCOSE 128* 85  BUN 13 13  CREATININE 3.04* 3.19*  CALCIUM 7.0* 7.0*    Recent Labs  03/20/17 1216  PTH 140*   Iron Studies:  Recent Labs  03/20/17 1230  IRON 91  TIBC 223*    Studies/Results: Ir Fluoro Guide Cv Line Right  Result Date: 03/21/2017 CLINICAL DATA:  Renal insufficiency, needs venous access for hemodialysis. EXAM: TUNNELED HEMODIALYSIS CATHETER PLACEMENT WITH ULTRASOUND AND FLUOROSCOPIC GUIDANCE TECHNIQUE: The procedure, risks, benefits, and alternatives were explained to the patient. Questions regarding the procedure were encouraged and answered. The patient understands and consents to the procedure. Patency of the right IJ vein was confirmed with ultrasound with image documentation. An  appropriate skin site was determined. Region was prepped using maximum barrier technique including cap and mask, sterile gown, sterile gloves, large sterile sheet, and Chlorhexidine as cutaneous antisepsis. The region was infiltrated locally with 1% lidocaine. Intravenous Fentanyl and Versed were administered as conscious sedation during continuous monitoring of the patient's level of consciousness and physiological / cardiorespiratory status by the radiology RN, with a total moderate sedation time of 20 minutes. Under real-time ultrasound guidance, the right IJ vein was accessed with a 21 gauge micropuncture needle; the needle tip within the vein was confirmed with ultrasound image documentation. Needle exchanged over the 018 guidewire for transitional dilator, which allowed advancement of a Benson wire into the IVC. Over this, an MPA catheter was advanced. A Hemosplit 19 hemodialysis catheter was tunneled from the right anterior chest wall approach to the right IJ dermatotomy site. The MPA catheter was exchanged over an Amplatz wire for serial vascular dilators which allow placement of a peel-away sheath, through which the catheter was advanced under intermittent fluoroscopy, positioned with its tips in the proximal and midright atrium. Spot chest radiograph confirms good catheter position. No pneumothorax. Catheter was flushed and primed per protocol. Catheter secured externally with O Prolene sutures. A 3-0 Monocryl purse-string suture was placed around the skin entry site to control mild venous oozing. The right IJ dermatotomy site was closed with Dermabond. COMPLICATIONS: COMPLICATIONS None immediate FLUOROSCOPY TIME:  10 seconds COMPARISON:  None IMPRESSION: 1. Technically successful placement of tunneled right IJ hemodialysis catheter with ultrasound and fluoroscopic guidance. Ready for routine use. ACCESS: Remains approachable for  percutaneous intervention as needed. Electronically Signed   By: Lucrezia Europe  M.D.   On: 03/21/2017 14:20   Ir US Guide Vasc Access Right  Result Date: 03/21/2017 CLINICAL DATA:  Renal insufficiency, needs venous access for hemodialysis. EXAM: TUNNELED HEMODIALYSIS CATHETER PLACEMENT WITH ULTRASOUND AND FLUOROSCOPIC GUIDANCE TECHNIQUE: The procedure, risks, benefits, and alternatives were explained to the patient. Questions regarding the procedure were encouraged and answered. The patient understands and consents to the procedure. Patency of the right IJ vein was confirmed with ultrasound with image documentation. An appropriate skin site was determined. Region was prepped using maximum barrier technique including cap and mask, sterile gown, sterile gloves, large sterile sheet, and Chlorhexidine as cutaneous antisepsis. The region was infiltrated locally with 1% lidocaine. Intravenous Fentanyl and Versed were administered as conscious sedation during continuous monitoring of the patient's level of consciousness and physiological / cardiorespiratory status by the radiology RN, with a total moderate sedation time of 20 minutes. Under real-time ultrasound guidance, the right IJ vein was accessed with a 21 gauge micropuncture needle; the needle tip within the vein was confirmed with ultrasound image documentation. Needle exchanged over the 018 guidewire for transitional dilator, which allowed advancement of a Benson wire into the IVC. Over this, an MPA catheter was advanced. A Hemosplit 19 hemodialysis catheter was tunneled from the right anterior chest wall approach to the right IJ dermatotomy site. The MPA catheter was exchanged over an Amplatz wire for serial vascular dilators which allow placement of a peel-away sheath, through which the catheter was advanced under intermittent fluoroscopy, positioned with its tips in the proximal and midright atrium. Spot chest radiograph confirms good catheter position. No pneumothorax. Catheter was flushed and primed per protocol. Catheter secured  externally with O Prolene sutures. A 3-0 Monocryl purse-string suture was placed around the skin entry site to control mild venous oozing. The right IJ dermatotomy site was closed with Dermabond. COMPLICATIONS: COMPLICATIONS None immediate FLUOROSCOPY TIME:  10 seconds COMPARISON:  None IMPRESSION: 1. Technically successful placement of tunneled right IJ hemodialysis catheter with ultrasound and fluoroscopic guidance. Ready for routine use. ACCESS: Remains approachable for percutaneous intervention as needed. Electronically Signed   By: Lucrezia Europe M.D.   On: 03/21/2017 14:20    I have reviewed the patient's current medications.  Assessment/Plan: 1 ESRD 2n d HD yest, will hold off until Tues.  Not uremic any more.   2 anemia esa  3 HPTH vit D 4 Low k GI. Will use with caution and use 4 k bath 5 Low Mg check in am 6 Low Ca vit D, po Ca 7 Crohns causing e'lyte abn 8 Weak needs PT P  HD on Tues, check Mg, Ca,K, Phos   LOS: 3 days   Natasha [name] L 03/22/2017,7:55 AM

## 2017-03-22 NOTE — Progress Notes (Signed)
Called Radiology to inquire about IV ABT order they placed.  Pharmacy is asking when the IV ABT needs to be dosed.

## 2017-03-22 NOTE — Progress Notes (Signed)
Patient had a few runs NSVT 13-20 beats Dr.Kim notified. Labs checked and Potassium given per order.

## 2017-03-22 NOTE — Progress Notes (Signed)
PROGRESS NOTE    Natasha [name]  AJG:[id number] DOB: [birth year] DOA: 03/19/2017 PCP: [name], MD      Brief Narrative:  71 yo F with PMHx significant for Crohns on Humira recently referred to [facility] for new fistula formation, CKD V, baseline Cr 4 not yet on HD who comes in with 1 week abdominal cramps/pain as well as frequent diarrhea admitted for Crohn's flare and Acute on chronic kdiney failure.  CT of the abdomen and pelvis showed only gastritis.  Started on IV fluids, steroids and antibiotics on admission.    Overnight, condition worsened.  Severe hypoCa, hypoMag and weakness.  She developed a seizure that aborted spontaneously after a nminute.  Had a tunneled catheter placed and went to HD that afternoon.   Dialyzed on two consecutive days, condition improved, no further seizures.   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  Had one loose stool yesterday only.  Discussed with GI yesterday, they are of same opinion as me, no Crohns, doubt infection. -Stop steroids and antibiotics both -Monitor stool output -Due for Humira on Saturday Nov 3  VT Asymptomatic non-sustained VT last night.  Reviewed on tele.   -K and mag per Nephrology  Seizure Provoked in setting of uremia, electrolyte derangements.   No previous epilepsy.  None since.  Hypocalcemia and Hypokalemia and Hypomagnesemia Per Neph and HD -Daily Renal panel, mag  COPD -Continue  inhalers  Hypothyroidism -Continue levothyroxine  Anemia of chronic renal disease Stable    DVT prophylaxis: SCDs Code Status: FULL Family Communication: None present, lives with husband Disposition Plan: Likely to SNF after discharge.  Will need several more days admission.   Consultants:   Nephrology  Procedures:   None  Antimicrobials:   Levaquin 10/25 >> 10/27  Flagyl 10/25 >>  10/27   Subjective: Again feeling better, just tired.  Rare fleeting abdominal cramps, normal for her.  Legs feel very "heavy", bilaterally, not numb, no pain, tender to palpation, no skin changes.  No unilateral weakness, slurred speech.   Objective: Vitals:   03/21/17 1925 03/22/17 0300 03/22/17 0725 03/22/17 0929  BP: (!) 99/54 (!) 119/59 (!) 126/58   Pulse: (!) 101 98 (!) 101   Resp: (!) 93 17 (!) 23   Temp: 99.2 F (37.3 C) 98.5 F (36.9 C) 99.4 F (37.4 C)   TempSrc: Oral Oral Oral   SpO2: 95% 96% 94% 99%  Weight:  46.8 kg (103 lb 2.8 oz)    Height:        Intake/Output Summary (Last 24 hours) at 03/22/17 1235 Last data filed at 03/22/17 1100  Gross per 24 hour  Intake             1440 ml  Output              675 ml  Net              765 ml   [employer]  03/21/17 0528 03/21/17 1530 03/22/17 0300  Weight: 45.6 kg (100 lb 8.5 oz) 46.8 kg (103 lb 2.8 oz) 46.8 kg (103 lb 2.8 oz)    Examination: General appearance: Elderly adult female, awake, alert. HEENT: Negative Skin: Warm and dry.  No suspicious rashes or lesions.  Skin on lower legs normal. Cardiac: RRR, nl S1-S2, no murmurs appreciated.  No LE edema.   Respiratory: Normal respiratory rate and rhythm.  CTAB without rales or wheezes. Abdomen: Abdomen soft.  no TTP. No ascites, distension, hepatosplenomegaly.   MSK: No deformities or effusions.  There is mild tenderness to palpation of calves bilaterally wtihout redness or swelling. Neuro: Negative Psych: Negative    Data Reviewed: I have personally  reviewed following labs and imaging studies:  CBC:  Recent Labs Lab 03/19/17 1453 03/20/17 0608  WBC 8.7 7.5  HGB 8.7* 8.2*  HCT 27.1* 25.1*  MCV 94.8 95.1  PLT 153 323*   Basic Metabolic Panel:  Recent Labs Lab 03/20/17 0608 03/20/17 1125 03/20/17 1230 03/21/17 0256 03/21/17 2135 03/22/17 0142 03/22/17 0957  NA 142  --   --  142 144 144 145  K 3.0*  --   --  2.6* 2.8* 3.1* 3.4*  CL 119*  --   --  115* 112* 111 112*  CO2 <7*  --   --  18* 24 26 25   GLUCOSE 44*  --   --  113* 128* 85 123*  BUN 70*  --   --  35* 13 13 14   CREATININE 7.98*  --   --  4.78* 3.04* 3.19* 3.65*  CALCIUM 5.4*  --   --  5.9* 7.0* 7.0* 7.0*  MG  --  0.8*  --  1.6* 1.8  --   --   PHOS  --   --  5.7* 2.5  --   --  1.1*   GFR: Estimated Creatinine Clearance: 10.6 mL/min (A) (by C-G formula based on SCr of 3.65 mg/dL (H)). Liver Function Tests:  Recent Labs Lab 03/19/17 1453 03/20/17 0608 03/21/17 0256 03/22/17 0142 03/22/17 0957  AST 20 17  --  19  --   ALT 13* 11*  --  12*  --   ALKPHOS 115 105  --  71  --   BILITOT 0.6 0.7  --  0.7  --   PROT 6.2* 5.7*  --  4.6*  --   ALBUMIN 3.1* 2.8* 2.2* 2.4* 2.4*    Recent Labs Lab 03/19/17 1453  LIPASE 172*   No results for input(s): AMMONIA in the last 168 hours. Coagulation Profile:  Recent Labs Lab 03/20/17 1230  INR 1.16   Cardiac Enzymes: No results for input(s): CKTOTAL, CKMB, CKMBINDEX, TROPONINI in the last 168 hours. BNP (last 3 results)  Recent Labs  07/30/16 1156  PROBNP 272   HbA1C: No results for input(s): HGBA1C in the last 72 hours. CBG:  Recent Labs Lab 03/21/17 0811 03/21/17 1142 03/21/17 2159 03/22/17 0750 03/22/17 1127  GLUCAP 83 109* 129* 81 107*   Lipid Profile: No results for input(s): CHOL, HDL, LDLCALC, TRIG, CHOLHDL, LDLDIRECT in the last 72 hours. Thyroid Function Tests: No results for input(s): TSH, T4TOTAL, FREET4, T3FREE, THYROIDAB in the last 72 hours. Anemia Panel:  Recent Labs   03/20/17 1230  TIBC 223*  IRON 91   Urine analysis:    Component Value Date/Time   COLORURINE YELLOW 03/19/2017 1934   APPEARANCEUR HAZY (A) 03/19/2017 1934   LABSPEC 1.006 03/19/2017 1934  PHURINE 5.0 03/19/2017 1934   GLUCOSEU NEGATIVE 03/19/2017 1934   HGBUR MODERATE (A) 03/19/2017 [place] NEGATIVE 03/19/2017 [place] 03/19/2017 1934   PROTEINUR 30 (A) 03/19/2017 1934   UROBILINOGEN 0.2 09/09/2014 0850   NITRITE NEGATIVE 03/19/2017 1934   LEUKOCYTESUR NEGATIVE 03/19/2017 1934   Sepsis Labs: @LABRCNTIP (procalcitonin:4,lacticidven:4)  ) Recent Results (from the past 240 hour(s))  Culture, blood (routine x 2)     Status: None (Preliminary result)   Collection Time: 03/19/17  4:20 PM  Result Value Ref Range Status   Specimen Description BLOOD RIGHT ANTECUBITAL  Final   Special Requests   Final    BOTTLES DRAWN AEROBIC AND ANAEROBIC Blood Culture adequate volume   Culture NO GROWTH 2 DAYS  Final   Report Status PENDING  Incomplete  Urine culture     Status: Abnormal   Collection Time: 03/19/17  7:34 PM  Result Value Ref Range Status   Specimen Description URINE, CLEAN CATCH  Final   Special Requests NONE  Final   Culture (A)  Final    40,000 COLONIES/mL LACTOBACILLUS SPECIES Standardized susceptibility testing for this organism is not available.    Report Status 03/21/2017 FINAL  Final  MRSA PCR Screening     Status: None   Collection Time: 03/20/17  5:01 PM  Result Value Ref Range Status   MRSA by PCR NEGATIVE NEGATIVE Final    Comment:        The GeneXpert MRSA Assay (FDA approved for NASAL specimens only), is one component of a comprehensive MRSA colonization surveillance program. It is not intended to diagnose MRSA infection nor to guide or monitor treatment for MRSA infections.          Radiology Studies: Ir Fluoro Guide Cv Line Right  Result Date: 03/21/2017 CLINICAL DATA:  Renal insufficiency, needs venous access  for hemodialysis. EXAM: TUNNELED HEMODIALYSIS CATHETER PLACEMENT WITH ULTRASOUND AND FLUOROSCOPIC GUIDANCE TECHNIQUE: The procedure, risks, benefits, and alternatives were explained to the patient. Questions regarding the procedure were encouraged and answered. The patient understands and consents to the procedure. Patency of the right IJ vein was confirmed with ultrasound with image documentation. An appropriate skin site was determined. Region was prepped using maximum barrier technique including cap and mask, sterile gown, sterile gloves, large sterile sheet, and Chlorhexidine as cutaneous antisepsis. The region was infiltrated locally with 1% lidocaine. Intravenous Fentanyl and Versed were administered as conscious sedation during continuous monitoring of the patient's level of consciousness and physiological / cardiorespiratory status by the radiology RN, with a total moderate sedation time of 20 minutes. Under real-time ultrasound guidance, the right IJ vein was accessed with a 21 gauge micropuncture needle; the needle tip within the vein was confirmed with ultrasound image documentation. Needle exchanged over the 018 guidewire for transitional dilator, which allowed advancement of a Benson wire into the IVC. Over this, an MPA catheter was advanced. A Hemosplit 19 hemodialysis catheter was tunneled from the right anterior chest wall approach to the right IJ dermatotomy site. The MPA catheter was exchanged over an Amplatz wire for serial vascular dilators which allow placement of a peel-away sheath, through which the catheter was advanced under intermittent fluoroscopy, positioned with its tips in the proximal and midright atrium. Spot chest radiograph confirms good catheter position. No pneumothorax. Catheter was flushed and primed per protocol. Catheter secured externally with O Prolene sutures. A 3-0 Monocryl purse-string suture was placed around the skin entry site to control mild venous oozing. The  right  IJ dermatotomy site was closed with Dermabond. COMPLICATIONS: COMPLICATIONS None immediate FLUOROSCOPY TIME:  10 seconds COMPARISON:  None IMPRESSION: 1. Technically successful placement of tunneled right IJ hemodialysis catheter with ultrasound and fluoroscopic guidance. Ready for routine use. ACCESS: Remains approachable for percutaneous intervention as needed. Electronically Signed   By: Lucrezia Europe M.D.   On: 03/21/2017 14:20   Ir US Guide Vasc Access Right  Result Date: 03/21/2017 CLINICAL DATA:  Renal insufficiency, needs venous access for hemodialysis. EXAM: TUNNELED HEMODIALYSIS CATHETER PLACEMENT WITH ULTRASOUND AND FLUOROSCOPIC GUIDANCE TECHNIQUE: The procedure, risks, benefits, and alternatives were explained to the patient. Questions regarding the procedure were encouraged and answered. The patient understands and consents to the procedure. Patency of the right IJ vein was confirmed with ultrasound with image documentation. An appropriate skin site was determined. Region was prepped using maximum barrier technique including cap and mask, sterile gown, sterile gloves, large sterile sheet, and Chlorhexidine as cutaneous antisepsis. The region was infiltrated locally with 1% lidocaine. Intravenous Fentanyl and Versed were administered as conscious sedation during continuous monitoring of the patient's level of consciousness and physiological / cardiorespiratory status by the radiology RN, with a total moderate sedation time of 20 minutes. Under real-time ultrasound guidance, the right IJ vein was accessed with a 21 gauge micropuncture needle; the needle tip within the vein was confirmed with ultrasound image documentation. Needle exchanged over the 018 guidewire for transitional dilator, which allowed advancement of a Benson wire into the IVC. Over this, an MPA catheter was advanced. A Hemosplit 19 hemodialysis catheter was tunneled from the right anterior chest wall approach to the right IJ dermatotomy  site. The MPA catheter was exchanged over an Amplatz wire for serial vascular dilators which allow placement of a peel-away sheath, through which the catheter was advanced under intermittent fluoroscopy, positioned with its tips in the proximal and midright atrium. Spot chest radiograph confirms good catheter position. No pneumothorax. Catheter was flushed and primed per protocol. Catheter secured externally with O Prolene sutures. A 3-0 Monocryl purse-string suture was placed around the skin entry site to control mild venous oozing. The right IJ dermatotomy site was closed with Dermabond. COMPLICATIONS: COMPLICATIONS None immediate FLUOROSCOPY TIME:  10 seconds COMPARISON:  None IMPRESSION: 1. Technically successful placement of tunneled right IJ hemodialysis catheter with ultrasound and fluoroscopic guidance. Ready for routine use. ACCESS: Remains approachable for percutaneous intervention as needed. Electronically Signed   By: Lucrezia Europe M.D.   On: 03/21/2017 14:20        Scheduled Meds: . calcitRIOL  2 mcg Oral Daily  . calcium carbonate  2 tablet Oral BID WC  . [START ON 03/25/2017] cyanocobalamin  1,000 mcg Intramuscular Q8 Weeks  . darbepoetin (ARANESP) injection - DIALYSIS  200 mcg Intravenous Q Sat-HD  . fluticasone furoate-vilanterol  1 puff Inhalation Daily  . heparin  40 Units/kg Dialysis Once in dialysis  . levothyroxine  125 mcg Oral QAC breakfast  . multivitamin  1 tablet Oral QHS  . potassium chloride  20 mEq Oral Daily  . umeclidinium bromide  1 puff Inhalation Daily   Continuous Infusions: . sodium chloride    . sodium chloride    .  ceFAZolin (ANCEF) IV       LOS: 3 days    Time spent: 20 minutes    Edwin Dada, MD [facility] Pager [phone]  If 7PM-7AM, please contact night-coverage [email or web address] Password [facility] 03/22/2017, 12:35 PM

## 2017-03-22 NOTE — Progress Notes (Signed)
MD, Patient requesting nutritional shake with each meal.  I asked what she has had before, she states "something like Boost".  Please order if you advise.

## 2017-03-22 NOTE — Progress Notes (Signed)
Transferred to [place] room 34 by bed , stable, report give to RN, belongings with pt.

## 2017-03-23 LAB — BPAM RBC
BLOOD PRODUCT EXPIRATION DATE: 201811162359
ISSUE DATE / TIME: 201810281538
Unit Type and Rh: 5100

## 2017-03-23 LAB — RENAL FUNCTION PANEL
Albumin: 2.4 g/dL — ABNORMAL LOW (ref 3.5–5.0)
Anion gap: 6 (ref 5–15)
BUN: 15 mg/dL (ref 6–20)
CHLORIDE: 114 mmol/L — AB (ref 101–111)
CO2: 23 mmol/L (ref 22–32)
CREATININE: 4.41 mg/dL — AB (ref 0.44–1.00)
Calcium: 7 mg/dL — ABNORMAL LOW (ref 8.9–10.3)
GFR calc Af Amer: 11 mL/min — ABNORMAL LOW (ref >60)
GFR calc non Af Amer: 9 mL/min — ABNORMAL LOW (ref >60)
Glucose, Bld: 81 mg/dL (ref 65–99)
PHOSPHORUS: 1.7 mg/dL — AB (ref 2.5–4.6)
Potassium: 3.1 mmol/L — ABNORMAL LOW (ref 3.5–5.1)
Sodium: 143 mmol/L (ref 135–145)

## 2017-03-23 LAB — CBC
HCT: 26.7 % — ABNORMAL LOW (ref 36.0–46.0)
Hemoglobin: 8.6 g/dL — ABNORMAL LOW (ref 12.0–15.0)
MCH: 30.4 pg (ref 26.0–34.0)
MCHC: 32.2 g/dL (ref 30.0–36.0)
MCV: 94.3 fL (ref 78.0–100.0)
PLATELETS: 103 10*3/uL — AB (ref 150–400)
RBC: 2.83 MIL/uL — AB (ref 3.87–5.11)
RDW: 17.2 % — AB (ref 11.5–15.5)
WBC: 9.4 10*3/uL (ref 4.0–10.5)

## 2017-03-23 LAB — TYPE AND SCREEN
ABO/RH(D): O POS
ANTIBODY SCREEN: NEGATIVE
Unit division: 0

## 2017-03-23 LAB — GLUCOSE, CAPILLARY
GLUCOSE-CAPILLARY: 81 mg/dL (ref 65–99)
GLUCOSE-CAPILLARY: 76 mg/dL (ref 65–99)
GLUCOSE-CAPILLARY: 120 mg/dL — AB (ref 65–99)
Glucose-Capillary: 103 mg/dL — ABNORMAL HIGH (ref 65–99)

## 2017-03-23 LAB — MAGNESIUM: Magnesium: 1.4 mg/dL — ABNORMAL LOW (ref 1.7–2.4)

## 2017-03-23 MED ORDER — PREDNISONE 5 MG PO TABS
5.0000 mg | ORAL_TABLET | Freq: Every day | ORAL | Status: DC
  Administered 2017-03-23 – 2017-03-27 (×5): 5 mg via ORAL
  Filled 2017-03-23 (×5): qty 1

## 2017-03-23 MED ORDER — BOOST / RESOURCE BREEZE PO LIQD
1.0000 | Freq: Two times a day (BID) | ORAL | Status: DC | PRN
Start: 2017-03-23 — End: 2017-03-27

## 2017-03-23 MED ORDER — MAGNESIUM OXIDE 400 (241.3 MG) MG PO TABS
400.0000 mg | ORAL_TABLET | Freq: Once | ORAL | Status: AC
  Administered 2017-03-23: 400 mg via ORAL
  Filled 2017-03-23: qty 1

## 2017-03-23 MED ORDER — POTASSIUM CHLORIDE CRYS ER 20 MEQ PO TBCR
20.0000 meq | EXTENDED_RELEASE_TABLET | Freq: Once | ORAL | Status: AC
  Administered 2017-03-23: 20 meq via ORAL
  Filled 2017-03-23: qty 1

## 2017-03-23 MED ORDER — FERROUS SULFATE 325 (65 FE) MG PO TABS: 325.0000 mg | ORAL_TABLET | Freq: Every day | ORAL | Status: DC

## 2017-03-23 NOTE — Progress Notes (Signed)
PROGRESS NOTE    Natasha [name]  TSV:[id number] DOB: March 16, 1946 DOA: 03/19/2017 PCP: [name], MD      Brief Narrative:  71 yo F with PMHx significant for Crohns on Humira recently referred to [facility] for new fistula formation, CKD V, baseline Cr 4 not yet on HD who comes in with 1 week abdominal cramps/pain as well as frequent diarrhea admitted for Crohn's flare and Acute on chronic kdiney failure.  CT of the abdomen and pelvis showed only gastritis.  Started on IV fluids, steroids and antibiotics on admission.    Overnight, condition worsened.  Severe hypoCa, hypoMag and weakness.  She developed a seizure that aborted spontaneously after a nminute.  Had a tunneled catheter placed and went to HD that afternoon.   Dialyzed on two consecutive days, condition improved, no further seizures.   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  Hgb 6.5 yesterday, transfused once.  Resolved.  Deconditioning Boost as needed PT eval  Crohn's flare ruled out 4 months the patient has been feeling nausea, skins findings, malaise, vomiting, abdominal cramps, increased stools, which she and her GI at [facility] and [place] had been unsure if they were from Crohn's disease.  Appears these were actually from uremia  -Monitor stool output -Due for Humira on Saturday Nov 3  VT ruled out We reviewed telemetry, sinus tachycardia only.    Seizure Provoked in setting of uremia, electrolyte derangements.   No  previous epilepsy.  None since.  Hypocalcemia and Hypokalemia and Hypomagnesemia Per Neph and HD -Daily Renal panel, mag  COPD -Continue inhalers  Hypothyroidism -Continue levothyroxine  Anemia of chronic renal disease Stable    DVT prophylaxis: SCDs Code Status: FULL Family Communication: None present, lives with husband Disposition Plan: Likely to SNF after discharge.  Will need several more days admission.   Consultants:   Nephrology  Procedures:   None  Antimicrobials:   Levaquin 10/25 >> 10/27  Flagyl 10/25 >>  10/27   Subjective: Feeling a lot better today.  No abdominal pain.  3 soft bowel movements yesterday, close to her normal.  No vomiting, no malaise.  Leg weakness is a little bit better.   Objective: Vitals:   03/22/17 1605 03/22/17 1619 03/22/17 1721 03/22/17 1955  BP: (!) 136/52 127/63 (!) 140/56 (!) 141/50  Pulse: 94 (!) 105 98 94  Resp: 20   18  Temp: 99.8 F (37.7 C) 99.4 F (37.4 C) 98.4 F (36.9 C) 99.6 F (37.6 C)  TempSrc: Oral Oral Axillary Oral  SpO2:  98% 98% 95%  Weight:      Height:        Intake/Output Summary (Last 24 hours) at 03/23/17 0801 Last data filed at 03/23/17 0200  Gross per 24 hour  Intake             1100 ml  Output              276 ml  Net  824 ml   [employer]   03/21/17 0528 03/21/17 1530 03/22/17 0300  Weight: 45.6 kg (100 lb 8.5 oz) 46.8 kg (103 lb 2.8 oz) 46.8 kg (103 lb 2.8 oz)    Examination: General appearance: Elderly adult female, awake, alert. HEENT: Negative Skin: Warm and dry.  No suspicious rashes or lesions.  Skin on lower legs normal. Cardiac: RRR, nl S1-S2, no murmurs appreciated.     Respiratory: Normal respiratory rate and rhythm.  CTAB without rales or wheezes. Abdomen: Abdomen soft.  no TTP. No ascites, distension, hepatosplenomegaly.   Neuro: Negative Psych: Negative    Data Reviewed: I have personally reviewed following labs and imaging  studies:  CBC:  Recent Labs Lab 03/19/17 1453 03/20/17 0608 03/22/17 1255 03/23/17 0459  WBC 8.7 7.5 7.3 PENDING  HGB 8.7* 8.2* 6.5* 8.6*  HCT 27.1* 25.1* 20.2* 26.7*  MCV 94.8 95.1 94.4 94.3  PLT 153 145* 122* PENDING   Basic Metabolic Panel:  Recent Labs Lab 03/20/17 1125 03/20/17 1230 03/21/17 0256 03/21/17 2135 03/22/17 0142 03/22/17 0957 03/22/17 1255 03/23/17 0459  NA  --   --  142 144 144 145 142 143  K  --   --  2.6* 2.8* 3.1* 3.4* 3.2* 3.1*  CL  --   --  115* 112* 111 112* 110 114*  CO2  --   --  18* 24 26 25 25 23   GLUCOSE  --   --  113* 128* 85 123* 94 81  BUN  --   --  35* 13 13 14 13 15   CREATININE  --   --  4.78* 3.04* 3.19* 3.65* 3.82* 4.41*  CALCIUM  --   --  5.9* 7.0* 7.0* 7.0* 6.8* 7.0*  MG 0.8*  --  1.6* 1.8  --   --   --  1.4*  PHOS  --  5.7* 2.5  --   --  1.1* 1.4* 1.7*   GFR: Estimated Creatinine Clearance: 8.8 mL/min (A) (by C-G formula based on SCr of 4.41 mg/dL (H)). Liver Function Tests:  Recent Labs Lab 03/19/17 1453 03/20/17 3614 03/21/17 0256 03/22/17 0142 03/22/17 0957 03/22/17 1255 03/23/17 0459  AST 20 17  --  19  --   --   --   ALT 13* 11*  --  12*  --   --   --   ALKPHOS 115 105  --  71  --   --   --   BILITOT 0.6 0.7  --  0.7  --   --   --   PROT 6.2* 5.7*  --  4.6*  --   --   --   ALBUMIN 3.1* 2.8* 2.2* 2.4* 2.4* 2.4* 2.4*    Recent Labs Lab 03/19/17 1453  LIPASE 172*   No results for input(s): AMMONIA in the last 168 hours. Coagulation Profile:  Recent Labs Lab 03/20/17 1230  INR 1.16   Cardiac Enzymes: No results for input(s): CKTOTAL, CKMB, CKMBINDEX, TROPONINI in the last 168 hours. BNP (last 3 results)  Recent Labs  07/30/16 1156  PROBNP 272   HbA1C: No results for input(s): HGBA1C in the last 72 hours. CBG:  Recent Labs Lab 03/21/17 1142 03/21/17 2159 03/22/17 0750 03/22/17 1127 03/22/17 1646  GLUCAP 109* 129* 81 107* 102*   Lipid Profile: No results for input(s): CHOL, HDL,  LDLCALC, TRIG, CHOLHDL, LDLDIRECT in the last 72 hours. Thyroid Function Tests: No results for input(s): TSH, T4TOTAL, FREET4, T3FREE, THYROIDAB in the last  72 hours. Anemia Panel:  Recent Labs  03/20/17 1230  TIBC 223*  IRON 91   Urine analysis:    Component Value Date/Time   COLORURINE YELLOW 03/19/2017 1934   APPEARANCEUR HAZY (A) 03/19/2017 1934   LABSPEC 1.006 03/19/2017 1934   PHURINE 5.0 03/19/2017 1934   GLUCOSEU NEGATIVE 03/19/2017 1934   HGBUR MODERATE (A) 03/19/2017 [place] NEGATIVE 03/19/2017 [place] 03/19/2017 1934   PROTEINUR 30 (A) 03/19/2017 1934   UROBILINOGEN 0.2 09/09/2014 0850   NITRITE NEGATIVE 03/19/2017 1934   LEUKOCYTESUR NEGATIVE 03/19/2017 1934   Sepsis Labs: @LABRCNTIP (procalcitonin:4,lacticidven:4)  ) Recent Results (from the past 240 hour(s))  Culture, blood (routine x 2)     Status: None (Preliminary result)   Collection Time: 03/19/17  4:20 PM  Result Value Ref Range Status   Specimen Description BLOOD RIGHT ANTECUBITAL  Final   Special Requests   Final    BOTTLES DRAWN AEROBIC AND ANAEROBIC Blood Culture adequate volume   Culture NO GROWTH 3 DAYS  Final   Report Status PENDING  Incomplete  Urine culture     Status: Abnormal   Collection Time: 03/19/17  7:34 PM  Result Value Ref Range Status   Specimen Description URINE, CLEAN CATCH  Final   Special Requests NONE  Final   Culture (A)  Final    40,000 COLONIES/mL LACTOBACILLUS SPECIES Standardized susceptibility testing for this organism is not available.    Report Status 03/21/2017 FINAL  Final  MRSA PCR Screening     Status: None   Collection Time: 03/20/17  5:01 PM  Result Value Ref Range Status   MRSA by PCR NEGATIVE NEGATIVE Final    Comment:        The GeneXpert MRSA Assay (FDA approved for NASAL specimens only), is one component of a comprehensive MRSA colonization surveillance program. It is not intended to diagnose MRSA infection nor to  guide or monitor treatment for MRSA infections.          Radiology Studies: No results found.      Scheduled Meds: . calcitRIOL  2 mcg Oral Daily  . calcium carbonate  2 tablet Oral BID WC  . [START ON 03/25/2017] cyanocobalamin  1,000 mcg Intramuscular Q8 Weeks  . darbepoetin (ARANESP) injection - DIALYSIS  200 mcg Intravenous Q Sat-HD  . fluticasone furoate-vilanterol  1 puff Inhalation Daily  . heparin  40 Units/kg Dialysis Once in dialysis  . levothyroxine  125 mcg Oral QAC breakfast  . magnesium oxide  400 mg Oral Once  . multivitamin  1 tablet Oral QHS  . potassium chloride  20 mEq Oral Daily  . potassium chloride  20 mEq Oral Once  . umeclidinium bromide  1 puff Inhalation Daily   Continuous Infusions: . sodium chloride    . sodium chloride    . sodium chloride 10 mL/hr at 03/22/17 1928  .  ceFAZolin (ANCEF) IV       LOS: 4 days    Time spent: 20 minutes    Edwin Dada, MD [facility] Pager [phone]  If 7PM-7AM, please contact night-coverage [email or web address] Password [email or web address] 03/23/2017, 8:01 AM

## 2017-03-23 NOTE — Care Management Important Message (Signed)
Important Message  Patient Details  Name: Natasha [name] MRN: [id number] Date of Birth: 02-10-1946   Medicare Important Message Given:  Yes    [name] May 03/23/2017, 8:58 AM

## 2017-03-23 NOTE — Progress Notes (Signed)
S: Ms. Guilfoil says she is feeling okay today. She does continue to have nausea and diarrhea. She feels less confused today but has difficulty accounting the last few days and continues to feel weak today.   O:BP (!) 148/53 (BP Location: Right Leg)   Pulse 95   Temp 98.7 F (37.1 C) (Oral)   Resp 18   Ht 5' 3"  (1.6 m)   Wt 103 lb 2.8 oz (46.8 kg)   SpO2 97%   BMI 18.28 kg/m   Intake/Output Summary (Last 24 hours) at 03/23/17 1007 Last data filed at 03/23/17 0900  Gross per 24 hour  Intake              820 ml  Output              276 ml  Net              544 ml   Intake/Output: I/O last 3 completed shifts: In: 1850 [P.O.:640; I.V.:750; Blood:460] Out: 276 [Urine:275; Stool:1]  Intake/Output this shift:  Total I/O In: 120 [P.O.:120] Out: 0  Weight change:  Gen: awake, alert CVS: RRR, no murmur appreciated  Resp: clear to auscultation bilateral  Abd: BS+, soft, distended, mild diffuse tenderness to palpation  Ext: no peripheral edema    Recent Labs Lab 03/19/17 1453 03/20/17 0608 03/20/17 1230 03/21/17 0256 03/21/17 2135 03/22/17 0142 03/22/17 0957 03/22/17 1255 03/23/17 0459  NA 141 142  --  142 144 144 145 142 143  K 2.4* 3.0*  --  2.6* 2.8* 3.1* 3.4* 3.2* 3.1*  CL 118* 119*  --  115* 112* 111 112* 110 114*  CO2 9* <7*  --  18* 24 26 25 25 23   GLUCOSE 97 44*  --  113* 128* 85 123* 94 81  BUN 68* 70*  --  35* 13 13 14 13 15   CREATININE 8.46* 7.98*  --  4.78* 3.04* 3.19* 3.65* 3.82* 4.41*  ALBUMIN 3.1* 2.8*  --  2.2*  --  2.4* 2.4* 2.4* 2.4*  CALCIUM 5.4* 5.4*  --  5.9* 7.0* 7.0* 7.0* 6.8* 7.0*  PHOS  --   --  5.7* 2.5  --   --  1.1* 1.4* 1.7*  AST 20 17  --   --   --  19  --   --   --   ALT 13* 11*  --   --   --  12*  --   --   --    Liver Function Tests:  Recent Labs Lab 03/19/17 1453 03/20/17 0608  03/22/17 0142 03/22/17 0957 03/22/17 1255 03/23/17 0459  AST 20 17  --  19  --   --   --   ALT 13* 11*  --  12*  --   --   --   ALKPHOS 115 105  --   71  --   --   --   BILITOT 0.6 0.7  --  0.7  --   --   --   PROT 6.2* 5.7*  --  4.6*  --   --   --   ALBUMIN 3.1* 2.8*  < > 2.4* 2.4* 2.4* 2.4*  < > = values in this interval not displayed.  Recent Labs Lab 03/19/17 1453  LIPASE 172*   No results for input(s): AMMONIA in the last 168 hours. CBC:  Recent Labs Lab 03/19/17 1453 03/20/17 0608 03/22/17 1255 03/23/17 0459  WBC 8.7 7.5 7.3 9.4  HGB 8.7*  8.2* 6.5* 8.6*  HCT 27.1* 25.1* 20.2* 26.7*  MCV 94.8 95.1 94.4 94.3  PLT 153 145* 122* 103*   Cardiac Enzymes: No results for input(s): CKTOTAL, CKMB, CKMBINDEX, TROPONINI in the last 168 hours. CBG:  Recent Labs Lab 03/21/17 2159 03/22/17 0750 03/22/17 1127 03/22/17 1646 03/23/17 0804  GLUCAP 129* 81 107* 102* 81    Iron Studies:  Recent Labs  03/20/17 1230  IRON 91  TIBC 223*   Studies/Results: No results found. . calcitRIOL  2 mcg Oral Daily  . calcium carbonate  2 tablet Oral BID WC  . [START ON 03/25/2017] cyanocobalamin  1,000 mcg Intramuscular Q8 Weeks  . darbepoetin (ARANESP) injection - DIALYSIS  200 mcg Intravenous Q Sat-HD  . fluticasone furoate-vilanterol  1 puff Inhalation Daily  . heparin  40 Units/kg Dialysis Once in dialysis  . levothyroxine  125 mcg Oral QAC breakfast  . multivitamin  1 tablet Oral QHS  . potassium chloride  20 mEq Oral Daily  . umeclidinium bromide  1 puff Inhalation Daily    BMET    Component Value Date/Time   NA 143 03/23/2017 0459   NA 142 07/30/2016 1156   K 3.1 (L) 03/23/2017 0459   CL 114 (H) 03/23/2017 0459   CO2 23 03/23/2017 0459   GLUCOSE 81 03/23/2017 0459   BUN 15 03/23/2017 0459   BUN 31 (H) 07/30/2016 1156   CREATININE 4.41 (H) 03/23/2017 0459   CALCIUM 7.0 (L) 03/23/2017 0459   CALCIUM 5.9 (LL) 03/29/2014 2304   GFRNONAA 9 (L) 03/23/2017 0459   GFRAA 11 (L) 03/23/2017 0459   CBC    Component Value Date/Time   WBC 9.4 03/23/2017 0459   RBC 2.83 (L) 03/23/2017 0459   HGB 8.6 (L) 03/23/2017  0459   HCT 26.7 (L) 03/23/2017 0459   PLT 103 (L) 03/23/2017 0459   MCV 94.3 03/23/2017 0459   MCH 30.4 03/23/2017 0459   MCHC 32.2 03/23/2017 0459   RDW 17.2 (H) 03/23/2017 0459   LYMPHSABS 2.7 07/15/2016 0549   MONOABS 0.7 07/15/2016 0549   EOSABS 0.2 07/15/2016 0549   BASOSABS 0.0 07/15/2016 0549     Assessment/Plan:  1. ESRD - S/p 2 initiation treatments 10/26 and 10/27, BUN significantly improved , no anion gap, low K and phos, normal bicarb. Will continue to monitor and repeat renal panel tomorrow.  2. Anemia of chronic kidney disease - s/p 1 unit transfusion hgb is improved aranesp, last tsat 10/26 - 41% 3.  Mineral metabolic disorder - low calcium, phos, high PTH - vitamin D, calcium  4. Hypokalemia 2/2 diarrhea- Repleting with K - Dur  5. Refeeding syndrome - switched to regular diet today  6.  Hypomagnesemia- repleting with mag ox 7 Crohns disease - diarrhea improving, planning for humira  8 Weakness-Steroids discontinued 10/26. PT evaluation today.   Ledell Noss, PGY2

## 2017-03-23 NOTE — Evaluation (Signed)
Physical Therapy Evaluation Patient Details Name: Natasha [name] MRN: [id number] DOB: 24-Feb-1946 Today's Date: 03/23/2017   History of Present Illness  71 yo female with onset of acute renal failure and seizure was admitted, has low electrolytes on admit and metabolic acidosis.  Quite indep prior to admit.  PMHx:  dehydration, Crohn's, CHF, glaucoma, COPD, CKD 5, PVD,   Clinical Impression  Pt is up to walk a short trip but adjacent to bed due to instability and weakness of knees esp.  Her husband is present to discuss a plan to try to get to SNF for strengthening and restoration of safe mobility, and will follow acutely for strengthening, stretches for pain at knees and gait/balance as tolerated with safety awareness for pt.      Follow Up Recommendations SNF    Equipment Recommendations  None recommended by PT    Recommendations for Other Services       Precautions / Restrictions Precautions Precautions: Fall Precaution Comments: pain at IT band lateral knees Restrictions Weight Bearing Restrictions: No      Mobility  Bed Mobility Overal bed mobility: Needs Assistance Bed Mobility: Supine to Sit;Sit to Supine     Supine to sit: Min assist Sit to supine: Min guard;Min assist   General bed mobility comments: slow to sit up wtih bedrail and return to bed with support for legs  Transfers Overall transfer level: Needs assistance Equipment used: Rolling walker (2 wheeled);1 person hand held assist Transfers: Sit to/from Stand              Ambulation/Gait Ambulation/Gait assistance: Min assist Ambulation Distance (Feet): 12 Feet Assistive device: Rolling walker (2 wheeled);1 person hand held assist Gait Pattern/deviations: Step-to pattern;Decreased stride length;Wide base of support;Trunk flexed;Shuffle Gait velocity: reduced Gait velocity interpretation: Below normal speed for age/gender General Gait Details: pt is sidestepping at bedside due to weakness in  legs and pain in IT band at knees  Stairs            Wheelchair Mobility    Modified Rankin (Stroke Patients Only)       Balance Overall balance assessment: History of Falls;Needs assistance Sitting-balance support: Feet supported Sitting balance-Leahy Scale: Fair     Standing balance support: Bilateral upper extremity supported;During functional activity Standing balance-Leahy Scale: Poor Standing balance comment: walker was relieving of lateral thigh pain                             Pertinent Vitals/Pain Pain Assessment: Faces Faces Pain Scale: Hurts little more Pain Location: B knees on abd tendon attachments at knees Pain Descriptors / Indicators: Sore Pain Intervention(s): Limited activity within patient's tolerance;Monitored during session;Repositioned    Home Living Family/patient expects to be discharged to:: Private residence Living Arrangements: Spouse/significant other Available Help at Discharge: Family;Available 24 hours/day Type of Home: House Home Access: Level entry     Home Layout: One level Home Equipment: Walker - 2 wheels Additional Comments: had walker from previous admission    Prior Function Level of Independence: Independent         Comments: driving and out in community normally     Hand Dominance   Dominant Hand: Right    Extremity/Trunk Assessment   Upper Extremity Assessment Upper Extremity Assessment: Overall WFL for tasks assessed    Lower Extremity Assessment Lower Extremity Assessment: Generalized weakness    Cervical / Trunk Assessment Cervical / Trunk Assessment: Normal  Communication   Communication:  No difficulties  Cognition Arousal/Alertness: Awake/alert Behavior During Therapy: WFL for tasks assessed/performed Overall Cognitive Status: Within Functional Limits for tasks assessed                                        General Comments General comments (skin integrity,  edema, etc.): Pt has some sensitivity of B knees laterally with assoc IT tightness, worked on stretches in bed with pillows to control her excursion and manage comfort    Exercises Other Exercises Other Exercises: IT band stretches with gentle flexed knees and lateral shifting to pillows   Assessment/Plan    PT Assessment Patient needs continued PT services  PT Problem List Decreased strength;Decreased range of motion;Decreased activity tolerance;Decreased balance;Decreased mobility;Decreased coordination;Decreased knowledge of use of DME;Decreased safety awareness;Pain       PT Treatment Interventions DME instruction;Gait training;Functional mobility training;Therapeutic activities;Therapeutic exercise;Balance training;Neuromuscular re-education;Patient/family education    PT Goals (Current goals can be found in the Care Plan section)  Acute Rehab PT Goals Patient Stated Goal: to get stronger and get back to home acitivity PT Goal Formulation: With patient/family Time For Goal Achievement: 04/06/17 Potential to Achieve Goals: Good    Frequency Min 2X/week   Barriers to discharge   has limited tolerance for any mobility    Co-evaluation               AM-PAC PT "6 Clicks" Daily Activity  Outcome Measure Difficulty turning over in bed (including adjusting bedclothes, sheets and blankets)?: A Little Difficulty moving from lying on back to sitting on the side of the bed? : Unable Difficulty sitting down on and standing up from a chair with arms (e.g., wheelchair, bedside commode, etc,.)?: Unable Help needed moving to and from a bed to chair (including a wheelchair)?: A Little Help needed walking in hospital room?: A Little Help needed climbing 3-5 steps with a railing? : Total 6 Click Score: 12    End of Session Equipment Utilized During Treatment: Gait belt Activity Tolerance: Patient tolerated treatment well;Patient limited by pain Patient left: in bed;with call  bell/phone within reach;with bed alarm set;with family/visitor present Nurse Communication: Mobility status PT Visit Diagnosis: Unsteadiness on feet (R26.81);History of falling (Z91.81);Muscle weakness (generalized) (M62.81);Difficulty in walking, not elsewhere classified (R26.2);Pain Pain - Right/Left:  (B knees)    Time: 0100-7121 PT Time Calculation (min) (ACUTE ONLY): 23 min   Charges:   PT Evaluation $PT Eval Moderate Complexity: 1 Mod PT Treatments $Gait Training: 8-22 mins   PT G Codes:   PT G-Codes **NOT FOR INPATIENT CLASS** Functional Assessment Tool Used: AM-PAC 6 Clicks Basic Mobility    Ramond Dial 03/23/2017, 11:16 AM   Mee Hives, PT MS Acute Rehab Dept. Number: [employer]

## 2017-03-24 LAB — CBC
HCT: 27.6 % — ABNORMAL LOW (ref 36.0–46.0)
Hemoglobin: 8.7 g/dL — ABNORMAL LOW (ref 12.0–15.0)
MCH: 30.1 pg (ref 26.0–34.0)
MCHC: 31.5 g/dL (ref 30.0–36.0)
MCV: 95.5 fL (ref 78.0–100.0)
PLATELETS: DECREASED 10*3/uL (ref 150–400)
RBC: 2.89 MIL/uL — ABNORMAL LOW (ref 3.87–5.11)
RDW: 16.9 % — ABNORMAL HIGH (ref 11.5–15.5)
WBC: 8.9 10*3/uL (ref 4.0–10.5)

## 2017-03-24 LAB — GLUCOSE, CAPILLARY
GLUCOSE-CAPILLARY: 71 mg/dL (ref 65–99)
GLUCOSE-CAPILLARY: 103 mg/dL — AB (ref 65–99)
Glucose-Capillary: 49 mg/dL — ABNORMAL LOW (ref 65–99)
Glucose-Capillary: 151 mg/dL — ABNORMAL HIGH (ref 65–99)
Glucose-Capillary: 83 mg/dL (ref 65–99)

## 2017-03-24 LAB — RENAL FUNCTION PANEL
Albumin: 2.5 g/dL — ABNORMAL LOW (ref 3.5–5.0)
Anion gap: 8 (ref 5–15)
BUN: 16 mg/dL (ref 6–20)
CALCIUM: 7.9 mg/dL — AB (ref 8.9–10.3)
CO2: 21 mmol/L — AB (ref 22–32)
Chloride: 113 mmol/L — ABNORMAL HIGH (ref 101–111)
Creatinine, Ser: 4.84 mg/dL — ABNORMAL HIGH (ref 0.44–1.00)
GFR calc Af Amer: 10 mL/min — ABNORMAL LOW (ref >60)
GFR calc non Af Amer: 8 mL/min — ABNORMAL LOW (ref >60)
Glucose, Bld: 77 mg/dL (ref 65–99)
PHOSPHORUS: 3 mg/dL (ref 2.5–4.6)
Potassium: 3.3 mmol/L — ABNORMAL LOW (ref 3.5–5.1)
SODIUM: 142 mmol/L (ref 135–145)

## 2017-03-24 LAB — CULTURE, BLOOD (ROUTINE X 2)
Culture: NO GROWTH
Special Requests: ADEQUATE

## 2017-03-24 LAB — MAGNESIUM: Magnesium: 1.2 mg/dL — ABNORMAL LOW (ref 1.7–2.4)

## 2017-03-24 MED ORDER — DIPHENHYDRAMINE HCL 25 MG PO CAPS: 25.0000 mg | ORAL_CAPSULE | Freq: Four times a day (QID) | ORAL | Status: DC | PRN

## 2017-03-24 NOTE — Progress Notes (Signed)
PROGRESS NOTE    Natasha [name]  PYP:[id number] DOB: 08-Apr-1946 DOA: 03/19/2017 PCP: [name], MD      Brief Narrative:  70 yo F with PMHx significant for Crohns on Humira recently referred to [facility] for new fistula formation, CKD V, baseline Cr 4 not yet on HD who comes in with 1 week abdominal cramps/pain as well as frequent diarrhea admitted for Crohn's flare and Acute on chronic kdiney failure.  CT of the abdomen and pelvis showed only gastritis.  Started on IV fluids, steroids and antibiotics on admission.    Overnight, condition worsened.  Severe hypoCa, hypoMag and weakness.  She developed a seizure that aborted spontaneously after a nminute.  Had a tunneled catheter placed and went to HD that afternoon.   Since then, has undergone dialysis per Nephrology, condition improved, no further seizures.  PT evaluated and recommended SNF.     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 -Boost  -PT eval  Crohn's flare ruled out 4 months the patient has been feeling nausea, skins findings, malaise, vomiting, abdominal cramps, increased stools, which she and her GI at [facility] and [place] had been unsure if they were from Crohn's disease.  Appears these were actually from uremia    -Due for Humira on Saturday Nov 3  Seizure Provoked in setting of uremia, electrolyte derangements.   No previous epilepsy.  None since.  Hypocalcemia and Hypokalemia and Hypomagnesemia Per Neph and HD -Daily Renal panel, mag  COPD -Continue inhalers  Hypothyroidism -Continue levothyroxine     DVT prophylaxis: SCDs Code Status: FULL Family Communication: None present, lives with husband Disposition Plan: Likely to SNF after discharge.  She would also like to try possibly home with [facility].CLIP process started, should be matched by Thurs or Fri and can d/c then.   Consultants:   Nephrology  Procedures:   None  Antimicrobials:   Levaquin 10/25 >> 10/27  Flagyl 10/25 >>  10/27   Subjective: Near normal.  Legs still weak.  No leg pain.  No chset pain or abdominal pain.  No diarrhea.  No hematochezia.  No abdominal pain.   Objective: Vitals:   03/24/17 1100 03/24/17 1130 03/24/17 1200 03/24/17 1207  BP: (!) 112/57 (!) 143/69 107/64 104/61  Pulse: 78 98 99 78  Resp: 16 16 16 17   Temp:    (!) 97.2 F (36.2 C)  TempSrc:    Oral  SpO2: 99%  98% 99%  Weight:    45.3 kg (99 lb 13.9 oz)  Height:        Intake/Output Summary (Last 24 hours) at 03/24/17 1330 Last data filed at 03/24/17 1207  Gross per 24 hour  Intake              300 ml  Output              250 ml  Net               50 ml   Filed Weights   03/23/17 2105 03/24/17 0900 03/24/17 1207  Weight: 46.5 kg (102 lb 8.2 oz) 45.4 kg (100 lb 1.4 oz) 45.3 kg (99 lb 13.9 oz)    Examination: General appearance: Elderly adult female, awake, alert. HEENT: Negative Skin: Warm and dry.  No suspicious rashes or lesions.  Skin on lower legs normal. Cardiac: RRR, nl S1-S2, no murmurs appreciated.     Respiratory: Normal respiratory rate and rhythm.  CTAB without rales or wheezes. Abdomen: Abdomen soft.  no TTP. No ascites, distension, hepatosplenomegaly.   Neuro: Negative Psych: Negative    Data  Reviewed: I have personally reviewed following labs and imaging studies:  CBC:  Recent Labs Lab 03/19/17 1453 03/20/17 0608 03/22/17 1255 03/23/17 0459  WBC 8.7 7.5 7.3 9.4  HGB 8.7* 8.2* 6.5* 8.6*  HCT 27.1* 25.1* 20.2* 26.7*  MCV 94.8 95.1 94.4 94.3  PLT 153 145* 122* 686*   Basic Metabolic Panel:  Recent Labs Lab 03/20/17 1125 03/20/17 1230 03/21/17 0256 03/21/17 2135 03/22/17 0142 03/22/17 0957 03/22/17 1255 03/23/17 0459  NA  --   --  142 144 144 145 142 143  K  --   --  2.6* 2.8* 3.1* 3.4* 3.2* 3.1*  CL  --   --  115* 112* 111 112* 110 114*  CO2  --   --  18* 24 26 25 25 23   GLUCOSE  --   --  113* 128* 85 123* 94 81  BUN  --   --  35* 13 13 14 13 15   CREATININE  --   --  4.78* 3.04* 3.19* 3.65* 3.82* 4.41*  CALCIUM  --   --  5.9* 7.0* 7.0* 7.0* 6.8* 7.0*  MG 0.8*  --  1.6* 1.8  --   --   --  1.4*  PHOS  --  5.7* 2.5  --   --  1.1* 1.4* 1.7*   GFR: Estimated Creatinine Clearance: 8.5 mL/min (A) (by C-G formula based on SCr of 4.41 mg/dL (H)). Liver Function Tests:  Recent Labs Lab 03/19/17 1453 03/20/17 1683 03/21/17 0256 03/22/17 0142 03/22/17 0957 03/22/17 1255 03/23/17 0459  AST 20 17  --  19  --   --   --   ALT 13* 11*  --  12*  --   --   --   ALKPHOS 115 105  --  71  --   --   --   BILITOT 0.6 0.7  --  0.7  --   --   --   PROT 6.2* 5.7*  --  4.6*  --   --   --   ALBUMIN 3.1* 2.8* 2.2* 2.4* 2.4* 2.4* 2.4*    Recent Labs Lab 03/19/17 1453  LIPASE 172*   No results for input(s): AMMONIA in the last 168 hours. Coagulation Profile:  Recent Labs Lab 03/20/17 1230  INR 1.16   Cardiac Enzymes: No results for input(s): CKTOTAL, CKMB, CKMBINDEX, TROPONINI in the last 168 hours. BNP (last 3 results)  Recent Labs  07/30/16 1156  PROBNP 272   HbA1C: No results for input(s): HGBA1C in the last 72 hours. CBG:  Recent Labs Lab 03/23/17 0804 03/23/17 1219 03/23/17 1728 03/23/17 2101 03/24/17 0731  GLUCAP 81 103* 76 120* 71  Lipid Profile: No results for input(s): CHOL, HDL, LDLCALC, TRIG, CHOLHDL, LDLDIRECT in the last 72 hours. Thyroid Function Tests: No results for input(s): TSH, T4TOTAL, FREET4, T3FREE, THYROIDAB in the last 72 hours. Anemia Panel: No results for input(s): VITAMINB12, FOLATE, FERRITIN, TIBC, IRON, RETICCTPCT in the last 72 hours. Urine analysis:    Component Value Date/Time   COLORURINE YELLOW 03/19/2017 1934   APPEARANCEUR HAZY (A) 03/19/2017 1934   LABSPEC 1.006 03/19/2017 1934   PHURINE 5.0 03/19/2017 1934   GLUCOSEU NEGATIVE 03/19/2017 1934   HGBUR MODERATE (A) 03/19/2017 [place] NEGATIVE 03/19/2017 [place] NEGATIVE 03/19/2017 1934   PROTEINUR 30 (A) 03/19/2017 1934   UROBILINOGEN 0.2 09/09/2014 0850   NITRITE NEGATIVE 03/19/2017 1934   LEUKOCYTESUR NEGATIVE 03/19/2017 1934   Sepsis Labs: @LABRCNTIP (procalcitonin:4,lacticidven:4)  ) Recent Results (from the past 240 hour(s))  Culture, blood (routine x 2)     Status: None (Preliminary result)   Collection Time: 03/19/17  4:20 PM  Result Value Ref Range Status   Specimen Description BLOOD RIGHT ANTECUBITAL  Final   Special Requests   Final    BOTTLES DRAWN AEROBIC AND ANAEROBIC Blood Culture adequate volume   Culture NO GROWTH 4 DAYS  Final   Report Status PENDING  Incomplete  Urine culture     Status: Abnormal   Collection Time: 03/19/17  7:34 PM  Result Value Ref Range Status   Specimen Description URINE, CLEAN CATCH  Final   Special Requests NONE  Final   Culture (A)  Final    40,000 COLONIES/mL LACTOBACILLUS SPECIES Standardized susceptibility testing for this organism is not available.    Report Status 03/21/2017 FINAL  Final  MRSA PCR Screening     Status: None   Collection Time: 03/20/17  5:01 PM  Result Value Ref Range Status   MRSA by PCR NEGATIVE NEGATIVE Final    Comment:        The GeneXpert MRSA Assay (FDA approved for NASAL specimens only), is one component of  a comprehensive MRSA colonization surveillance program. It is not intended to diagnose MRSA infection nor to guide or monitor treatment for MRSA infections.          Radiology Studies: No results found.      Scheduled Meds: . calcitRIOL  2 mcg Oral Daily  . calcium carbonate  2 tablet Oral BID WC  . [START ON 03/25/2017] cyanocobalamin  1,000 mcg Intramuscular Q8 Weeks  . darbepoetin (ARANESP) injection - DIALYSIS  200 mcg Intravenous Q Sat-HD  . fluticasone furoate-vilanterol  1 puff Inhalation Daily  . heparin  40 Units/kg Dialysis Once in dialysis  . levothyroxine  125 mcg Oral QAC breakfast  . multivitamin  1 tablet Oral QHS  . potassium chloride  20 mEq Oral Daily  . predniSONE  5 mg Oral Q breakfast  . umeclidinium bromide  1 puff Inhalation Daily   Continuous Infusions: . sodium chloride    . sodium chloride    . sodium chloride 10 mL/hr at 03/22/17 1928     LOS: 5 days    Time spent: 20 minutes    Edwin Dada, MD [facility] Pager [phone]  If 7PM-7AM, please contact night-coverage [email or web address] Password [email or web address] 03/24/2017, 1:30 PM

## 2017-03-24 NOTE — Progress Notes (Signed)
S: Mentions that the skin around her catheter site is itching and that she has been scratching this. Reports good appetite without nausea, no sob. Worked with PT yesterday, and now able to transfer to bedside commode but still feels too weak to walk. Denies any other concerns or complaints.   O:BP 125/87 (BP Location: Right Leg)   Pulse 98   Temp 98.2 F (36.8 C) (Oral)   Resp 20   Ht 5' 3"  (1.6 m)   Wt 102 lb 8.2 oz (46.5 kg)   SpO2 99%   BMI 18.16 kg/m   Intake/Output Summary (Last 24 hours) at 03/24/17 0851 Last data filed at 03/24/17 4967  Gross per 24 hour  Intake              360 ml  Output                0 ml  Net              360 ml   Intake/Output: I/O last 3 completed shifts: In: 940 [P.O.:480; Blood:460] Out: 0   Intake/Output this shift:  No intake/output data recorded. Weight change:  Gen: no acute distress, awake, alert and oriented  CVS: RRR, no murmur appreciated  Resp: bilateral rhonchi which clear with coughing  RFF:MBWG, non tender, non distended  Ext: trace peripheral edema, tunneled cath in right IJ    Recent Labs Lab 03/19/17 1453 03/20/17 0608 03/20/17 1230 03/21/17 0256 03/21/17 2135 03/22/17 0142 03/22/17 0957 03/22/17 1255 03/23/17 0459  NA 141 142  --  142 144 144 145 142 143  K 2.4* 3.0*  --  2.6* 2.8* 3.1* 3.4* 3.2* 3.1*  CL 118* 119*  --  115* 112* 111 112* 110 114*  CO2 9* <7*  --  18* 24 26 25 25 23   GLUCOSE 97 44*  --  113* 128* 85 123* 94 81  BUN 68* 70*  --  35* 13 13 14 13 15   CREATININE 8.46* 7.98*  --  4.78* 3.04* 3.19* 3.65* 3.82* 4.41*  ALBUMIN 3.1* 2.8*  --  2.2*  --  2.4* 2.4* 2.4* 2.4*  CALCIUM 5.4* 5.4*  --  5.9* 7.0* 7.0* 7.0* 6.8* 7.0*  PHOS  --   --  5.7* 2.5  --   --  1.1* 1.4* 1.7*  AST 20 17  --   --   --  19  --   --   --   ALT 13* 11*  --   --   --  12*  --   --   --    Liver Function Tests:  Recent Labs Lab 03/19/17 1453 03/20/17 0608  03/22/17 0142 03/22/17 0957 03/22/17 1255 03/23/17 0459  AST  20 17  --  19  --   --   --   ALT 13* 11*  --  12*  --   --   --   ALKPHOS 115 105  --  71  --   --   --   BILITOT 0.6 0.7  --  0.7  --   --   --   PROT 6.2* 5.7*  --  4.6*  --   --   --   ALBUMIN 3.1* 2.8*  < > 2.4* 2.4* 2.4* 2.4*  < > = values in this interval not displayed.  Recent Labs Lab 03/19/17 1453  LIPASE 172*   No results for input(s): AMMONIA in the last 168 hours. CBC:  Recent Labs  Lab 03/19/17 1453 03/20/17 0608 03/22/17 1255 03/23/17 0459  WBC 8.7 7.5 7.3 9.4  HGB 8.7* 8.2* 6.5* 8.6*  HCT 27.1* 25.1* 20.2* 26.7*  MCV 94.8 95.1 94.4 94.3  PLT 153 145* 122* 103*   Cardiac Enzymes: No results for input(s): CKTOTAL, CKMB, CKMBINDEX, TROPONINI in the last 168 hours. CBG:  Recent Labs Lab 03/23/17 0804 03/23/17 1219 03/23/17 1728 03/23/17 2101 03/24/17 0731  GLUCAP 81 103* 76 120* 71    Iron Studies: No results for input(s): IRON, TIBC, TRANSFERRIN, FERRITIN in the last 72 hours. Studies/Results: No results found. . calcitRIOL  2 mcg Oral Daily  . calcium carbonate  2 tablet Oral BID WC  . [START ON 03/25/2017] cyanocobalamin  1,000 mcg Intramuscular Q8 Weeks  . darbepoetin (ARANESP) injection - DIALYSIS  200 mcg Intravenous Q Sat-HD  . fluticasone furoate-vilanterol  1 puff Inhalation Daily  . heparin  40 Units/kg Dialysis Once in dialysis  . levothyroxine  125 mcg Oral QAC breakfast  . multivitamin  1 tablet Oral QHS  . potassium chloride  20 mEq Oral Daily  . predniSONE  5 mg Oral Q breakfast  . umeclidinium bromide  1 puff Inhalation Daily    BMET    Component Value Date/Time   NA 143 03/23/2017 0459   NA 142 07/30/2016 1156   K 3.1 (L) 03/23/2017 0459   CL 114 (H) 03/23/2017 0459   CO2 23 03/23/2017 0459   GLUCOSE 81 03/23/2017 0459   BUN 15 03/23/2017 0459   BUN 31 (H) 07/30/2016 1156   CREATININE 4.41 (H) 03/23/2017 0459   CALCIUM 7.0 (L) 03/23/2017 0459   CALCIUM 5.9 (LL) 03/29/2014 2304   GFRNONAA 9 (L) 03/23/2017 0459   GFRAA  11 (L) 03/23/2017 0459   CBC    Component Value Date/Time   WBC 9.4 03/23/2017 0459   RBC 2.83 (L) 03/23/2017 0459   HGB 8.6 (L) 03/23/2017 0459   HCT 26.7 (L) 03/23/2017 0459   PLT 103 (L) 03/23/2017 0459   MCV 94.3 03/23/2017 0459   MCH 30.4 03/23/2017 0459   MCHC 32.2 03/23/2017 0459   RDW 17.2 (H) 03/23/2017 0459   LYMPHSABS 2.7 07/15/2016 0549   MONOABS 0.7 07/15/2016 0549   EOSABS 0.2 07/15/2016 0549   BASOSABS 0.0 07/15/2016 0549     Assessment/Plan:  1. ESRD -  3rd initiation HD treatment today, labs will be drawn at HD.  2. Anemia of CKD - on aranesp, needs follow up CBC 3.  Mineral metabolic disorder -vitamin D, calcium 4. Hypokalemia 2/2 diarrhea- Repleted  5. Refeeding syndrome - regular diet  6. Hypomagnesemia- repleting with mag ox 7. Crohns disease - diarrhea improving, planning for humira 8. Weakness-Steroids discontinued, PT recommending SNF  9. Urticaria - trial of po benadryl   Ledell Noss, PGY 2

## 2017-03-24 NOTE — NC FL2 (Signed)
[place] LEVEL OF CARE SCREENING TOOL     IDENTIFICATION  Patient Name: Natasha [name] Birthdate: [birth year] Sex: female Admission Date (Current Location): 03/19/2017  [facility] and [place] Number:  Herbalist and Address:  [facility], [place] [address], [place], [place] [address]      Provider Number: [id number]  Attending Physician Name and Address:  Edwin Dada, *  Relative Name and Phone Number:       Current Level of Care: Hospital Recommended Level of Care: [place] Prior Approval Number:    Date Approved/Denied:   PASRR Number: [id number]  Discharge Plan: SNF    Current Diagnoses: Patient Active Problem List   Diagnosis Date Noted  . Crohn's disease of colon with complication ([place])   . Aortic insufficiency 07/30/2016  . Atherosclerosis of aorta ([place]) 07/30/2016  . Pressure injury of skin 07/14/2016  . Gastroenteritis, acute 07/12/2016  . COPD (chronic obstructive pulmonary disease) ([place]) 10/12/2015  . Malnutrition of moderate degree ([place]) 09/13/2014  . history of Left leg DVT 09/10/2014  . Hypotension 09/10/2014  . Hypoglycemia 09/10/2014  . Ischiorectal abscess 09/09/2014  . Increased anion gap metabolic acidosis   . Generalized abdominal pain   . Chronic diastolic CHF (congestive heart failure) ([place])   . Hypertensive heart disease   . Acute on chronic renal failure ([place])   . Secondary hyperparathyroidism ([place])   . Other specified hypothyroidism   . Right shoulder pain   . Thrombocytopenia ([place])   . Severe protein-calorie malnutrition ([place])   . Thyroid activity decreased   . Emphysema of lung ([place])   . Nephrolithiasis 03/29/2014  . AKI (acute kidney injury) ([place]) 03/29/2014  . Spinal stenosis of lumbar region 11/01/2013  . Bilateral leg pain 09/28/2013  . Emphysema/COPD ([place])   . Dyspnea 09/20/2013  . Protein-calorie malnutrition, severe ([place]) 05/14/2013  . Salmonella  enteritidis 06/11/2012  . Hypocalcemia 06/10/2012  . Hypothyroidism 06/10/2012  . Anemia of chronic disease 06/09/2012  . Hypomagnesemia 06/09/2012  . CKD (chronic kidney disease) stage 4, GFR 15-29 ml/min (HCC) 06/09/2012  . Acute renal failure ([place]) 06/07/2012  . Dehydration 06/07/2012  . Crohn's disease without complication ([place]) 68/03/5725  . Metabolic acidosis 20/35/5974  . Hypokalemia 06/07/2012  . Nausea vomiting and diarrhea 06/07/2012  . UTI (lower urinary tract infection) 06/07/2012    Orientation RESPIRATION BLADDER Height & Weight     Self, Time, Situation, Place  Normal Incontinent, External catheter Weight: 99 lb 13.9 oz (45.3 kg) Height:  5' 3"  (160 cm)  BEHAVIORAL SYMPTOMS/MOOD NEUROLOGICAL BOWEL NUTRITION STATUS      Continent Diet (see DC summary)  AMBULATORY STATUS COMMUNICATION OF NEEDS Skin   Limited Assist Verbally Normal                       Personal Care Assistance Level of Assistance  Bathing, Dressing Bathing Assistance: Limited assistance   Dressing Assistance: Limited assistance     Functional Limitations Info             SPECIAL CARE FACTORS FREQUENCY  PT (By licensed PT), OT (By licensed OT)     PT Frequency: 5/wk OT Frequency: 5/wk            Contractures      Additional Factors Info  Code Status, Allergies Code Status Info: FULL Allergies Info: Mercaptopurine, Remicade Infliximab           Current Medications (03/24/2017):  This is the current hospital active medication list Current Facility-Administered Medications  Medication Dose Route Frequency Provider Last Rate Last Dose  . 0.9 %  sodium chloride infusion  100 mL Intravenous PRN Deterding, James, MD      . 0.9 %  sodium chloride infusion  100 mL Intravenous PRN Deterding, Jeneen Rinks, MD      . 0.9 %  sodium chloride infusion   Intravenous Once Edwin Dada, MD 10 mL/hr at 03/22/17 1928    . acetaminophen (TYLENOL) tablet 325 mg  325 mg Oral Q6H PRN  Phillips Grout, MD   325 mg at 03/21/17 1307  . albuterol (PROVENTIL) (2.5 MG/3ML) 0.083% nebulizer solution 3 mL  3 mL Inhalation Q6H PRN Derrill Kay A, MD      . alteplase (CATHFLO ACTIVASE) injection 2 mg  2 mg Intracatheter Once PRN Mauricia Area, MD      . calcitRIOL (ROCALTROL) capsule 2 mcg  2 mcg Oral Daily Deterding, Jeneen Rinks, MD   2 mcg at 03/24/17 0803  . calcium carbonate (OS-CAL - dosed in mg of elemental calcium) tablet 1,000 mg of elemental calcium  2 tablet Oral BID WC Ledell Noss, MD   1,000 mg of elemental calcium at 03/24/17 0803  . [START ON 03/25/2017] cyanocobalamin ((VITAMIN B-12)) injection 1,000 mcg  1,000 mcg Intramuscular Q8 Kipp Laurence A, MD      . Darbepoetin Alfa (ARANESP) injection 200 mcg  200 mcg Intravenous Q Catheryn Bacon, MD   200 mcg at 03/21/17 1903  . diphenhydrAMINE (BENADRYL) capsule 25 mg  25 mg Oral Q6H PRN Ledell Noss, MD      . feeding supplement (BOOST / RESOURCE BREEZE) liquid 1 Container  1 Container Oral BID BM PRN Danford, Suann Larry, MD      . fluticasone furoate-vilanterol (BREO ELLIPTA) 100-25 MCG/INH 1 puff  1 puff Inhalation Daily Phillips Grout, MD   1 puff at 03/23/17 0806  . heparin injection 1,000 Units  1,000 Units Dialysis PRN Deterding, Jeneen Rinks, MD      . heparin injection 1,700 Units  40 Units/kg Dialysis Once in dialysis Deterding, Jeneen Rinks, MD      . levothyroxine (SYNTHROID, LEVOTHROID) tablet 125 mcg  125 mcg Oral QAC breakfast Phillips Grout, MD   125 mcg at 03/24/17 0800  . lidocaine (PF) (XYLOCAINE) 1 % injection 5 mL  5 mL Intradermal PRN Deterding, Jeneen Rinks, MD      . lidocaine-prilocaine (EMLA) cream 1 application  1 application Topical PRN Deterding, Jeneen Rinks, MD      . LORazepam (ATIVAN) injection 2 mg  2 mg Intravenous PRN Danford, Suann Larry, MD      . multivitamin (RENA-VIT) tablet 1 tablet  1 tablet Oral QHS Mauricia Area, MD   1 tablet at 03/23/17 2225  . nitroGLYCERIN (NITROSTAT) SL tablet 0.4 mg  0.4  mg Sublingual Q5 min PRN Phillips Grout, MD      . ondansetron [facility]) tablet 4 mg  4 mg Oral Q6H PRN Phillips Grout, MD       Or  . ondansetron [facility]) injection 4 mg  4 mg Intravenous Q6H PRN Phillips Grout, MD   4 mg at 03/20/17 0949  . oxyCODONE (Oxy IR/ROXICODONE) immediate release tablet 2.5-5 mg  2.5-5 mg Oral Q6H PRN Edwin Dada, MD   5 mg at 03/24/17 0302  . pentafluoroprop-tetrafluoroeth (GEBAUERS) aerosol 1 application  1 application Topical PRN Deterding, Jeneen Rinks, MD      .  potassium chloride SA (K-DUR,KLOR-CON) CR tablet 20 mEq  20 mEq Oral Daily Mauricia Area, MD   20 mEq at 03/24/17 0802  . predniSONE (DELTASONE) tablet 5 mg  5 mg Oral Q breakfast Danford, Suann Larry, MD   5 mg at 03/24/17 0700  . promethazine (PHENERGAN) tablet 25 mg  25 mg Oral Q6H PRN Derrill Kay A, MD      . umeclidinium bromide (INCRUSE ELLIPTA) 62.5 MCG/INH 1 puff  1 puff Inhalation Daily Phillips Grout, MD   1 puff at 03/23/17 0806     Discharge Medications: Please see discharge summary for a list of discharge medications.  Relevant Imaging Results:  Relevant Lab Results:   Additional Information SS#: [id number]  Jorge Ny, LCSW

## 2017-03-24 NOTE — Care Management Note (Signed)
Case Management Note  Patient Details  Name: Natasha [name] MRN: [id number] Date of Birth: [birth year]  Subjective/Objective:                 Patient admitted form home for A/CRF. Lives at home w spouse. Very deconditioned, SNF rec at DC. CSW consult in place. Anticipate DC to SNF when medically cleared.    Action/Plan:   Expected Discharge Date:                  Expected Discharge Plan:  Skilled Nursing Facility  In-House Referral:  Clinical Social Work  Discharge planning Services  CM Consult  Post Acute Care Choice:    Choice offered to:     DME Arranged:    DME Agency:     HH Arranged:    [place] Agency:     Status of Service:  In process, will continue to follow  If discussed at Long Length of Stay Meetings, dates discussed:    Additional Comments:  Carles Collet, RN 03/24/2017, 4:04 PM

## 2017-03-25 LAB — RENAL FUNCTION PANEL
ANION GAP: 8 (ref 5–15)
Albumin: 2.3 g/dL — ABNORMAL LOW (ref 3.5–5.0)
BUN: 9 mg/dL (ref 6–20)
CALCIUM: 8.1 mg/dL — AB (ref 8.9–10.3)
CO2: 22 mmol/L (ref 22–32)
Chloride: 111 mmol/L (ref 101–111)
Creatinine, Ser: 3 mg/dL — ABNORMAL HIGH (ref 0.44–1.00)
GFR calc Af Amer: 17 mL/min — ABNORMAL LOW (ref >60)
GFR calc non Af Amer: 15 mL/min — ABNORMAL LOW (ref >60)
GLUCOSE: 73 mg/dL (ref 65–99)
Phosphorus: 3.2 mg/dL (ref 2.5–4.6)
Potassium: 3 mmol/L — ABNORMAL LOW (ref 3.5–5.1)
SODIUM: 141 mmol/L (ref 135–145)

## 2017-03-25 LAB — GLUCOSE, CAPILLARY
GLUCOSE-CAPILLARY: 111 mg/dL — AB (ref 65–99)
GLUCOSE-CAPILLARY: 117 mg/dL — AB (ref 65–99)
Glucose-Capillary: 61 mg/dL — ABNORMAL LOW (ref 65–99)

## 2017-03-25 LAB — MAGNESIUM: Magnesium: 1.4 mg/dL — ABNORMAL LOW (ref 1.7–2.4)

## 2017-03-25 MED ORDER — DIPHENHYDRAMINE HCL 25 MG PO CAPS: 25.0000 mg | ORAL_CAPSULE | Freq: Four times a day (QID) | ORAL | Status: DC | PRN

## 2017-03-25 MED ORDER — POTASSIUM CHLORIDE CRYS ER 20 MEQ PO TBCR
40.0000 meq | EXTENDED_RELEASE_TABLET | Freq: Every day | ORAL | Status: DC
  Administered 2017-03-25 – 2017-03-27 (×3): 40 meq via ORAL
  Filled 2017-03-25 (×3): qty 2

## 2017-03-25 MED ORDER — MAGNESIUM OXIDE 400 (241.3 MG) MG PO TABS
400.0000 mg | ORAL_TABLET | Freq: Once | ORAL | Status: AC
  Administered 2017-03-25: 400 mg via ORAL
  Filled 2017-03-25: qty 1

## 2017-03-25 MED ORDER — ALUM & MAG HYDROXIDE-SIMETH 200-200-20 MG/5ML PO SUSP
15.0000 mL | ORAL | Status: DC | PRN
  Administered 2017-03-25: 30 mL via ORAL
  Filled 2017-03-25: qty 30

## 2017-03-25 MED ORDER — HYDROXYZINE HCL 10 MG PO TABS: 10.0000 mg | ORAL_TABLET | Freq: Three times a day (TID) | ORAL | Status: DC | PRN

## 2017-03-25 NOTE — Progress Notes (Signed)
PROGRESS NOTE  Natasha [name] HRC:[id number] DOB: [birth year] DOA: 03/19/2017 PCP: [name], MD   LOS: 6 days   Brief Narrative / Interim history: 71 yo F with PMHx significant for Crohns on Humira recently referred to [facility] for new fistula formation, CKD V, baseline Cr 4 not yet on HD who comes in with 1 week abdominal cramps/pain as well as frequent diarrhea admitted for Crohn's flare and Acute on chronic kdiney failure.  CT of the abdomen and pelvis showed only gastritis.  Started on IV fluids, steroids and antibiotics on admission.   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 Hollie Salk today  -In CLIP process, hopefully matched to center and able to d/c in 1-2 days.   Anemia in chronic renal disease -Transfused once for Hgb 6.5, Hb stable since  Severe protein calorie malnutrition in setting of chronic illness -From ESRD and chronic Crohn's disease.  Crohn's flare ruled out -4 months the patient has been feeling nausea, skins findings, malaise, vomiting, abdominal cramps, increased stools, which she and her GI at [facility] and [place] had been unsure if they were from Crohn's disease.  Appears these were actually from uremia  -Due for Humira on Saturday Nov 3 -for now continue home medications  Seizure -Provoked in setting of uremia, electrolyte derangements.   No previous epilepsy.  None since.  Hypocalcemia and Hypokalemia and Hypomagnesemia -Per Neph and HD -Daily Renal panel, mag  COPD -Continue  inhalers  Hypothyroidism -Continue levothyroxine  DVT prophylaxis: SCDs Code Status: Full code Family Communication: no family at bedside Disposition Plan: home vs SNF 1-2 days  Consultants:   Nephrology   Procedures:   None   Antimicrobials:  Levaquin 10/25 >> 10/27  Flagyl 10/25 >>  10/27   Subjective: -no chest pain, dyspnea. No abdominal pain, nausea/vomiting. Complains a lot that she had to wait 6h in HD yesterday   Objective: Vitals:   03/25/17 0522 03/25/17 0900 03/25/17 0957 03/25/17 0958  BP: (!) 105/53 (!) 102/55    Pulse: 88 81    Resp: 16 18    Temp: 98.5 F (36.9 C) 98.2 F (36.8 C)    TempSrc:  Oral    SpO2: 98% 96% 98% 98%  Weight:      Height:        Intake/Output Summary (Last 24 hours) at 03/25/17 1221 Last data filed at 03/25/17 0900  Gross per 24 hour  Intake              360 ml  Output                0 ml  Net              360 ml   Filed Weights   03/24/17 0900 03/24/17 1207 03/24/17 2027  Weight: 45.4 kg (100 lb 1.4 oz) 45.3 kg (99 lb 13.9 oz) 44.9 kg (99 lb)    Examination:  Constitutional: NAD Respiratory: clear to auscultation bilaterally, no wheezing, no crackles Cardiovascular: Regular rate and rhythm, no murmurs /  rubs / gallops. No LE edema.  Abdomen: no tenderness. Bowel sounds positive.  Neurologic: non focal    Data Reviewed: I have independently reviewed following labs and imaging studies   CBC:  Recent Labs Lab 03/19/17 1453 03/20/17 0608 03/22/17 1255 03/23/17 0459 03/24/17 1000  WBC 8.7 7.5 7.3 9.4 8.9  HGB 8.7* 8.2* 6.5* 8.6* 8.7*  HCT 27.1* 25.1* 20.2* 26.7* 27.6*  MCV 94.8 95.1 94.4 94.3 95.5  PLT 153 145* 122* 103* PLATELET CLUMPS NOTED ON SMEAR, COUNT APPEARS DECREASED   Basic Metabolic Panel:  Recent Labs Lab 03/21/17 0256 03/21/17 2135  03/22/17 0957 03/22/17 1255 03/23/17 0459 03/24/17 1000 03/25/17 0231  NA 142 144  < > 145 142 143 142 141  K 2.6* 2.8*  < > 3.4* 3.2* 3.1* 3.3*  3.0*  CL 115* 112*  < > 112* 110 114* 113* 111  CO2 18* 24  < > 25 25 23  21* 22  GLUCOSE 113* 128*  < > 123* 94 81 77 73  BUN 35* 13  < > 14 13 15 16 9   CREATININE 4.78* 3.04*  < > 3.65* 3.82* 4.41* 4.84* 3.00*  CALCIUM 5.9* 7.0*  < > 7.0* 6.8* 7.0* 7.9* 8.1*  MG 1.6* 1.8  --   --   --  1.4* 1.2* 1.4*  PHOS 2.5  --   --  1.1* 1.4* 1.7* 3.0 3.2  < > = values in this interval not displayed. GFR: Estimated Creatinine Clearance: 12.4 mL/min (A) (by C-G formula based on SCr of 3 mg/dL (H)). Liver Function Tests:  Recent Labs Lab 03/19/17 1453 03/20/17 0608  03/22/17 0142 03/22/17 0957 03/22/17 1255 03/23/17 0459 03/24/17 1000 03/25/17 0231  AST 20 17  --  19  --   --   --   --   --   ALT 13* 11*  --  12*  --   --   --   --   --   ALKPHOS 115 105  --  71  --   --   --   --   --   BILITOT 0.6 0.7  --  0.7  --   --   --   --   --   PROT 6.2* 5.7*  --  4.6*  --   --   --   --   --   ALBUMIN 3.1* 2.8*  < > 2.4* 2.4* 2.4* 2.4* 2.5* 2.3*  < > = values in this interval not displayed.  Recent Labs Lab 03/19/17 1453  LIPASE 172*   No results for input(s): AMMONIA in the last 168 hours. Coagulation Profile:  Recent Labs Lab 03/20/17 1230  INR 1.16   Cardiac Enzymes: No results for input(s): CKTOTAL, CKMB, CKMBINDEX, TROPONINI in the last 168 hours. BNP (last 3 results)  Recent Labs  07/30/16 1156  PROBNP 272   HbA1C: No results for input(s): HGBA1C in the last 72 hours. CBG:  Recent Labs Lab 03/24/17 1444 03/24/17 1702 03/24/17 2020 03/25/17 0800 03/25/17 1113  GLUCAP 103* 151* 83 61* 111*   Lipid Profile: No results for input(s): CHOL, HDL, LDLCALC, TRIG, CHOLHDL, LDLDIRECT in the last 72 hours. Thyroid Function Tests: No results for input(s): TSH, T4TOTAL, FREET4, T3FREE, THYROIDAB in the last 72 hours. Anemia Panel: No results for input(s): VITAMINB12, FOLATE, FERRITIN, TIBC, IRON, RETICCTPCT in the last 72 hours. Urine analysis:    Component Value  Date/Time   COLORURINE YELLOW 03/19/2017 1934   APPEARANCEUR HAZY (A)  03/19/2017 1934   LABSPEC 1.006 03/19/2017 1934   PHURINE 5.0 03/19/2017 [place] 03/19/2017 1934   HGBUR MODERATE (A) 03/19/2017 [place] 03/19/2017 [place] 03/19/2017 1934   PROTEINUR 30 (A) 03/19/2017 1934   UROBILINOGEN 0.2 09/09/2014 0850   NITRITE NEGATIVE 03/19/2017 [place] 03/19/2017 1934   Sepsis Labs: Invalid input(s): PROCALCITONIN, LACTICIDVEN  Recent Results (from the past 240 hour(s))  Culture, blood (routine x 2)     Status: None   Collection Time: 03/19/17  4:20 PM  Result Value Ref Range Status   Specimen Description BLOOD RIGHT ANTECUBITAL  Final   Special Requests   Final    BOTTLES DRAWN AEROBIC AND ANAEROBIC Blood Culture adequate volume   Culture NO GROWTH 5 DAYS  Final   Report Status 03/24/2017 FINAL  Final  Urine culture     Status: Abnormal   Collection Time: 03/19/17  7:34 PM  Result Value Ref Range Status   Specimen Description URINE, CLEAN CATCH  Final   Special Requests NONE  Final   Culture (A)  Final    40,000 COLONIES/mL LACTOBACILLUS SPECIES Standardized susceptibility testing for this organism is not available.    Report Status 03/21/2017 FINAL  Final  MRSA PCR Screening     Status: None   Collection Time: 03/20/17  5:01 PM  Result Value Ref Range Status   MRSA by PCR NEGATIVE NEGATIVE Final    Comment:        The GeneXpert MRSA Assay (FDA approved for NASAL specimens only), is one component of a comprehensive MRSA colonization surveillance program. It is not intended to diagnose MRSA infection nor to guide or monitor treatment for MRSA infections.       Radiology Studies: No results found.   Scheduled Meds: . calcitRIOL  2 mcg Oral Daily  . calcium carbonate  2 tablet Oral BID WC  . cyanocobalamin  1,000 mcg Intramuscular Q8 Weeks  . darbepoetin (ARANESP) injection - DIALYSIS   200 mcg Intravenous Q Sat-HD  . fluticasone furoate-vilanterol  1 puff Inhalation Daily  . heparin  40 Units/kg Dialysis Once in dialysis  . levothyroxine  125 mcg Oral QAC breakfast  . multivitamin  1 tablet Oral QHS  . potassium chloride  40 mEq Oral Daily  . predniSONE  5 mg Oral Q breakfast  . umeclidinium bromide  1 puff Inhalation Daily   Continuous Infusions: . sodium chloride    . sodium chloride    . sodium chloride 10 mL/hr at 03/22/17 1928     Marzetta Board, MD, PhD [facility] Pager [phone]  If 7PM-7AM, please contact night-coverage [email or web address] Password [email or web address] 03/25/2017, 12:21 PM

## 2017-03-25 NOTE — Progress Notes (Signed)
S: Ms. Harral says she is feeling okay today, denies any new concerns, still difficulties remembering the day of her seizure   O:BP (!) 102/55 (BP Location: Right Arm)   Pulse 81   Temp 98.2 F (36.8 C) (Oral)   Resp 18   Ht 5' 3"  (1.6 m)   Wt 99 lb (44.9 kg)   SpO2 98%   BMI 17.54 kg/m   Intake/Output Summary (Last 24 hours) at 03/25/17 1402 Last data filed at 03/25/17 0900  Gross per 24 hour  Intake              360 ml  Output                0 ml  Net              360 ml   Intake/Output: I/O last 3 completed shifts: In: 540 [P.O.:540] Out: 250 [Urine:250]  Intake/Output this shift:  No intake/output data recorded. Weight change: -2 lb 6.8 oz (-1.1 kg) Gen: Well appearing, no acute distress  CVS: RRR, no murmur appreciated  Resp:lungs clear to auscultation  Abd: soft, non tender, non distended  Ext: no peripheral edema, R IJ tunneled HD, L antecubital + thrill    Recent Labs Lab 03/19/17 1453 03/20/17 0608 03/20/17 1230 03/21/17 0256 03/21/17 2135 03/22/17 0142 03/22/17 0957 03/22/17 1255 03/23/17 0459 03/24/17 1000 03/25/17 0231  NA 141 142  --  142 144 144 145 142 143 142 141  K 2.4* 3.0*  --  2.6* 2.8* 3.1* 3.4* 3.2* 3.1* 3.3* 3.0*  CL 118* 119*  --  115* 112* 111 112* 110 114* 113* 111  CO2 9* <7*  --  18* 24 26 25 25 23  21* 22  GLUCOSE 97 44*  --  113* 128* 85 123* 94 81 77 73  BUN 68* 70*  --  35* 13 13 14 13 15 16 9   CREATININE 8.46* 7.98*  --  4.78* 3.04* 3.19* 3.65* 3.82* 4.41* 4.84* 3.00*  ALBUMIN 3.1* 2.8*  --  2.2*  --  2.4* 2.4* 2.4* 2.4* 2.5* 2.3*  CALCIUM 5.4* 5.4*  --  5.9* 7.0* 7.0* 7.0* 6.8* 7.0* 7.9* 8.1*  PHOS  --   --  5.7* 2.5  --   --  1.1* 1.4* 1.7* 3.0 3.2  AST 20 17  --   --   --  19  --   --   --   --   --   ALT 13* 11*  --   --   --  12*  --   --   --   --   --    Liver Function Tests:  Recent Labs Lab 03/19/17 1453 03/20/17 0608  03/22/17 0142  03/23/17 0459 03/24/17 1000 03/25/17 0231  AST 20 17  --  19  --   --    --   --   ALT 13* 11*  --  12*  --   --   --   --   ALKPHOS 115 105  --  71  --   --   --   --   BILITOT 0.6 0.7  --  0.7  --   --   --   --   PROT 6.2* 5.7*  --  4.6*  --   --   --   --   ALBUMIN 3.1* 2.8*  < > 2.4*  < > 2.4* 2.5* 2.3*  < > = values in this  interval not displayed.  Recent Labs Lab 03/19/17 1453  LIPASE 172*   No results for input(s): AMMONIA in the last 168 hours. CBC:  Recent Labs Lab 03/19/17 1453 03/20/17 0608 03/22/17 1255 03/23/17 0459 03/24/17 1000  WBC 8.7 7.5 7.3 9.4 8.9  HGB 8.7* 8.2* 6.5* 8.6* 8.7*  HCT 27.1* 25.1* 20.2* 26.7* 27.6*  MCV 94.8 95.1 94.4 94.3 95.5  PLT 153 145* 122* 103* PLATELET CLUMPS NOTED ON SMEAR, COUNT APPEARS DECREASED   Cardiac Enzymes: No results for input(s): CKTOTAL, CKMB, CKMBINDEX, TROPONINI in the last 168 hours. CBG:  Recent Labs Lab 03/24/17 1444 03/24/17 1702 03/24/17 2020 03/25/17 0800 03/25/17 1113  GLUCAP 103* 151* 83 61* 111*    Iron Studies: No results for input(s): IRON, TIBC, TRANSFERRIN, FERRITIN in the last 72 hours. Studies/Results: No results found. . calcitRIOL  2 mcg Oral Daily  . calcium carbonate  2 tablet Oral BID WC  . cyanocobalamin  1,000 mcg Intramuscular Q8 Weeks  . darbepoetin (ARANESP) injection - DIALYSIS  200 mcg Intravenous Q Sat-HD  . fluticasone furoate-vilanterol  1 puff Inhalation Daily  . heparin  40 Units/kg Dialysis Once in dialysis  . levothyroxine  125 mcg Oral QAC breakfast  . multivitamin  1 tablet Oral QHS  . potassium chloride  40 mEq Oral Daily  . predniSONE  5 mg Oral Q breakfast  . umeclidinium bromide  1 puff Inhalation Daily    BMET    Component Value Date/Time   NA 141 03/25/2017 0231   NA 142 07/30/2016 1156   K 3.0 (L) 03/25/2017 0231   CL 111 03/25/2017 0231   CO2 22 03/25/2017 0231   GLUCOSE 73 03/25/2017 0231   BUN 9 03/25/2017 0231   BUN 31 (H) 07/30/2016 1156   CREATININE 3.00 (H) 03/25/2017 0231   CALCIUM 8.1 (L) 03/25/2017 0231    CALCIUM 5.9 (LL) 03/29/2014 2304   GFRNONAA 15 (L) 03/25/2017 0231   GFRAA 17 (L) 03/25/2017 0231   CBC    Component Value Date/Time   WBC 8.9 03/24/2017 1000   RBC 2.89 (L) 03/24/2017 1000   HGB 8.7 (L) 03/24/2017 1000   HCT 27.6 (L) 03/24/2017 1000   PLT  03/24/2017 1000    PLATELET CLUMPS NOTED ON SMEAR, COUNT APPEARS DECREASED   MCV 95.5 03/24/2017 1000   MCH 30.1 03/24/2017 1000   MCHC 31.5 03/24/2017 1000   RDW 16.9 (H) 03/24/2017 1000   LYMPHSABS 2.7 07/15/2016 0549   MONOABS 0.7 07/15/2016 0549   EOSABS 0.2 07/15/2016 0549   BASOSABS 0.0 07/15/2016 0549     Assessment/Plan:  1. ESRD -  Labs improved appropriately after HD yesterday. SNF placement and CLIP in process and she will go for another tx tomorrow. She will need to f/u with [facility] after discharge for stage 2 of brachial AVF.  2. Anemia of CKD - on aranesp, hgb stable  3. Mineral metabolic disorder - on vitamin D, calcium, calcium improving  4. Hypokalemia 2/2 diarrhea- unimproved, K-dur increased today  5. Refeeding syndrome - phos improved with regular diet  6. Hypomagnesemia- repeat dose of mag ox  7. Crohns disease - per primary  8. Weakness- expresses concern for being able to get around in her home, SNF placement being arranged   Ledell Noss

## 2017-03-25 NOTE — Care Management Important Message (Signed)
Important Message  Patient Details  Name: Natasha [name] MRN: [id number] Date of Birth: 08-10-1945   Medicare Important Message Given:  Yes    Maryan Sivak Abena 03/25/2017, 10:00 AM

## 2017-03-26 LAB — BASIC METABOLIC PANEL
Anion gap: 9 (ref 5–15)
BUN: 14 mg/dL (ref 6–20)
CHLORIDE: 109 mmol/L (ref 101–111)
CO2: 20 mmol/L — ABNORMAL LOW (ref 22–32)
Calcium: 8.5 mg/dL — ABNORMAL LOW (ref 8.9–10.3)
Creatinine, Ser: 3.89 mg/dL — ABNORMAL HIGH (ref 0.44–1.00)
GFR calc Af Amer: 13 mL/min — ABNORMAL LOW (ref >60)
GFR calc non Af Amer: 11 mL/min — ABNORMAL LOW (ref >60)
Glucose, Bld: 69 mg/dL (ref 65–99)
POTASSIUM: 3.7 mmol/L (ref 3.5–5.1)
SODIUM: 138 mmol/L (ref 135–145)

## 2017-03-26 LAB — GLUCOSE, CAPILLARY
GLUCOSE-CAPILLARY: 45 mg/dL — AB (ref 65–99)
GLUCOSE-CAPILLARY: 53 mg/dL — AB (ref 65–99)
GLUCOSE-CAPILLARY: 105 mg/dL — AB (ref 65–99)
Glucose-Capillary: 83 mg/dL (ref 65–99)
Glucose-Capillary: 137 mg/dL — ABNORMAL HIGH (ref 65–99)

## 2017-03-26 LAB — CBC
HEMATOCRIT: 29.1 % — AB (ref 36.0–46.0)
Hemoglobin: 8.9 g/dL — ABNORMAL LOW (ref 12.0–15.0)
MCH: 30.2 pg (ref 26.0–34.0)
MCHC: 30.6 g/dL (ref 30.0–36.0)
MCV: 98.6 fL (ref 78.0–100.0)
Platelets: 121 10*3/uL — ABNORMAL LOW (ref 150–400)
RBC: 2.95 MIL/uL — ABNORMAL LOW (ref 3.87–5.11)
RDW: 16.9 % — ABNORMAL HIGH (ref 11.5–15.5)
WBC: 9.3 10*3/uL (ref 4.0–10.5)

## 2017-03-26 MED ORDER — DEXTROSE 50 % IV SOLN
INTRAVENOUS | Status: AC
  Filled 2017-03-26: qty 50

## 2017-03-26 MED ORDER — LOPERAMIDE HCL 2 MG PO CAPS
4.0000 mg | ORAL_CAPSULE | Freq: Once | ORAL | Status: AC
  Administered 2017-03-26: 4 mg via ORAL
  Filled 2017-03-26: qty 2

## 2017-03-26 NOTE — Progress Notes (Signed)
Physical Therapy Treatment Patient Details Name: FRANCINA BEERY MRN: [id number] DOB: 02-17-1946 Today's Date: 03/26/2017    History of Present Illness 71 yo female with onset of acute renal failure and seizure was admitted, has low electrolytes on admit and metabolic acidosis.  Quite indep prior to admit.  PMHx:  dehydration, Crohn's, CHF, glaucoma, COPD, CKD 5, PVD,     PT Comments    Pt progressing towards physical therapy goals. Pt reports feeling "wiped out" after morning HD session. Was agreeable to attempting ambulation with PT however initially states "I won't even be able to stand up". With increased time and max encouragement, pt was able to achieve 20' in room. Pt reports that husband is encouraging her to return home vs SNF. Discussed the benefits of SNF including increased frequency of therapy. At this time, feel it would be in the patient's best interests to maximize functional independence and improve tolerance for functional activity prior to return home. Will continue to follow and progress as able per POC.    Follow Up Recommendations  SNF     Equipment Recommendations  None recommended by PT    Recommendations for Other Services       Precautions / Restrictions Precautions Precautions: Fall Precaution Comments: pain at IT band lateral knees Restrictions Weight Bearing Restrictions: No    Mobility  Bed Mobility Overal bed mobility: Needs Assistance Bed Mobility: Supine to Sit;Sit to Supine     Supine to sit: Supervision Sit to supine: Supervision   General bed mobility comments: HOB elevated. Pt was able to transition to/from EOB with use of rails and increased time.  Transfers Overall transfer level: Needs assistance Equipment used: Rolling walker (2 wheeled);1 person hand held assist Transfers: Sit to/from Stand Sit to Stand: Min assist;Supervision         General transfer comment: Initially min assist required for power-up to full stand.  Practiced sit<>stand x4 throughout session and by end of session pt at a supervision level.   Ambulation/Gait Ambulation/Gait assistance: Min guard Ambulation Distance (Feet): 20 Feet (10' x2) Assistive device: Rolling walker (2 wheeled);1 person hand held assist Gait Pattern/deviations: Decreased stride length;Wide base of support;Trunk flexed;Shuffle;Step-through pattern Gait velocity: Decreased Gait velocity interpretation: Below normal speed for age/gender General Gait Details: Pt ambulated 20' total in room with a seated rest break after 10'. Pace is slow and overall gait pattern is guarded. VC's for improved posture - she was able to make corrective changes only when in front of the mirror where she could see herself flexing at the trunk.    Stairs            Wheelchair Mobility    Modified Rankin (Stroke Patients Only)       Balance Overall balance assessment: History of Falls;Needs assistance Sitting-balance support: Feet supported Sitting balance-Leahy Scale: Fair     Standing balance support: Bilateral upper extremity supported;During functional activity Standing balance-Leahy Scale: Poor                              Cognition Arousal/Alertness: Awake/alert Behavior During Therapy: WFL for tasks assessed/performed Overall Cognitive Status: Within Functional Limits for tasks assessed                                        Exercises      General Comments  Pertinent Vitals/Pain Pain Assessment: Faces Faces Pain Scale: Hurts little more Pain Location: B knees on abd tendon attachments at knees Pain Descriptors / Indicators: Sore Pain Intervention(s): Limited activity within patient's tolerance;Monitored during session    Home Living                      Prior Function            PT Goals (current goals can now be found in the care plan section) Acute Rehab PT Goals Patient Stated Goal: to get stronger  and get back to home activity PT Goal Formulation: With patient/family Time For Goal Achievement: 04/06/17 Potential to Achieve Goals: Good Progress towards PT goals: Progressing toward goals    Frequency    Min 2X/week      PT Plan Current plan remains appropriate    Co-evaluation              AM-PAC PT "6 Clicks" Daily Activity  Outcome Measure  Difficulty turning over in bed (including adjusting bedclothes, sheets and blankets)?: A Little Difficulty moving from lying on back to sitting on the side of the bed? : Unable Difficulty sitting down on and standing up from a chair with arms (e.g., wheelchair, bedside commode, etc,.)?: Unable Help needed moving to and from a bed to chair (including a wheelchair)?: A Little Help needed walking in hospital room?: A Little Help needed climbing 3-5 steps with a railing? : Total 6 Click Score: 12    End of Session Equipment Utilized During Treatment: Gait belt Activity Tolerance: Patient tolerated treatment well;Patient limited by pain Patient left: in bed;with call bell/phone within reach;with bed alarm set;with family/visitor present Nurse Communication: Mobility status PT Visit Diagnosis: Unsteadiness on feet (R26.81);History of falling (Z91.81);Muscle weakness (generalized) (M62.81);Difficulty in walking, not elsewhere classified (R26.2);Pain Pain - Right/Left:  (bilateral) Pain - part of body: Knee     Time: 5947-0761 PT Time Calculation (min) (ACUTE ONLY): 31 min  Charges:  $Gait Training: 23-37 mins                    G Codes:       Rolinda Roan, PT, DPT Acute Rehabilitation Services Pager: [phone]    Thelma Comp 03/26/2017, 2:58 PM

## 2017-03-26 NOTE — Progress Notes (Signed)
PROGRESS NOTE  Natasha [name] PPI:[id number] DOB: Apr 05, 1946 DOA: 03/19/2017 PCP: [name], MD   LOS: 7 days   Brief Narrative / Interim history: 71 yo F with PMHx significant for Crohns on Humira recently referred to [facility] for new fistula formation, CKD V, baseline Cr 4 not yet on HD who comes in with 1 week abdominal cramps/pain as well as frequent diarrhea admitted for Crohn's flare and Acute on chronic kdiney failure.  CT of the abdomen and pelvis showed only gastritis.  Started on IV fluids, steroids and antibiotics on admission.   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 Hollie Salk today  -In CLIP process, hopefully matched to center and able to d/c in 1-2 days.   Anemia in chronic renal disease -Transfused once for Hgb 6.5, Hb stable since  Severe protein calorie malnutrition in setting of chronic illness -From ESRD and chronic Crohn's disease.  Crohn's flare ruled out -4 months the patient has been feeling nausea, skins findings, malaise, vomiting, abdominal cramps, increased stools, which she and her GI at [facility] and [place] had been unsure if they were from Crohn's disease.  Appears these were actually from uremia  -Due for Humira on Saturday Nov 3 -for now continue home medications  Seizure -Provoked in setting of uremia, electrolyte derangements.   No previous epilepsy.  None since.  Hypocalcemia and Hypokalemia and Hypomagnesemia -Per Neph and HD -Daily Renal panel, mag  COPD -Continue  inhalers  Hypothyroidism -Continue levothyroxine  DVT prophylaxis: SCDs Code Status: Full code Family Communication: no family at bedside Disposition Plan: home vs SNF 1-2 days  Consultants:   Nephrology   Procedures:   None   Antimicrobials:  Levaquin 10/25 >> 10/27  Flagyl 10/25 >>  10/27   Subjective: -no chest pain, shortness of breath, no abdominal pain, nausea or vomiting. Upset that only worked with PT once 3 days ago  Objective: Vitals:   03/26/17 1030 03/26/17 1100 03/26/17 1104 03/26/17 1249  BP: 98/64 98/60 105/60 (!) 99/55  Pulse: 96 70 91 94  Resp: 18 16  18   Temp:  (!) 97 F (36.1 C)  98.2 F (36.8 C)  TempSrc:    Oral  SpO2: 98%   95%  Weight:  45.5 kg (100 lb 5 oz)    Height:        Intake/Output Summary (Last 24 hours) at 03/26/17 1416 Last data filed at 03/26/17 1104  Gross per 24 hour  Intake              180 ml  Output                0 ml  Net              180 ml   Filed Weights   03/25/17 2137 03/26/17 0730 03/26/17 1100  Weight: 45.1 kg (99 lb 6.8 oz) 45.5 kg (100 lb 5 oz) 45.5 kg (100 lb 5 oz)    Examination:  Constitutional: NAD Respiratory: CTA Cardiovascular: RRR  Data Reviewed: I have  independently reviewed following labs and imaging studies   CBC:  Recent Labs Lab 03/20/17 0608 03/22/17 1255 03/23/17 0459 03/24/17 1000 03/26/17 0513  WBC 7.5 7.3 9.4 8.9 9.3  HGB 8.2* 6.5* 8.6* 8.7* 8.9*  HCT 25.1* 20.2* 26.7* 27.6* 29.1*  MCV 95.1 94.4 94.3 95.5 98.6  PLT 145* 122* 103* PLATELET CLUMPS NOTED ON SMEAR, COUNT APPEARS DECREASED 419*   Basic Metabolic Panel:  Recent Labs Lab 03/21/17 0256 03/21/17 2135  03/22/17 0957 03/22/17 1255 03/23/17 0459 03/24/17 1000 03/25/17 0231 03/26/17 0513  NA 142 144  < > 145 142 143 142 141 138  K 2.6* 2.8*  < > 3.4* 3.2* 3.1* 3.3* 3.0* 3.7  CL 115* 112*  < > 112* 110 114* 113* 111 109  CO2 18* 24  < > 25 25 23  21* 22 20*  GLUCOSE 113* 128*  < > 123* 94 81 77 73 69   BUN 35* 13  < > 14 13 15 16 9 14   CREATININE 4.78* 3.04*  < > 3.65* 3.82* 4.41* 4.84* 3.00* 3.89*  CALCIUM 5.9* 7.0*  < > 7.0* 6.8* 7.0* 7.9* 8.1* 8.5*  MG 1.6* 1.8  --   --   --  1.4* 1.2* 1.4*  --   PHOS 2.5  --   --  1.1* 1.4* 1.7* 3.0 3.2  --   < > = values in this interval not displayed. GFR: Estimated Creatinine Clearance: 9.7 mL/min (A) (by C-G formula based on SCr of 3.89 mg/dL (H)). Liver Function Tests:  Recent Labs Lab 03/19/17 1453 03/20/17 0608  03/22/17 0142 03/22/17 0957 03/22/17 1255 03/23/17 0459 03/24/17 1000 03/25/17 0231  AST 20 17  --  19  --   --   --   --   --   ALT 13* 11*  --  12*  --   --   --   --   --   ALKPHOS 115 105  --  71  --   --   --   --   --   BILITOT 0.6 0.7  --  0.7  --   --   --   --   --   PROT 6.2* 5.7*  --  4.6*  --   --   --   --   --   ALBUMIN 3.1* 2.8*  < > 2.4* 2.4* 2.4* 2.4* 2.5* 2.3*  < > = values in this interval not displayed.  Recent Labs Lab 03/19/17 1453  LIPASE 172*   No results for input(s): AMMONIA in the last 168 hours. Coagulation Profile:  Recent Labs Lab 03/20/17 1230  INR 1.16   Cardiac Enzymes: No results for input(s): CKTOTAL, CKMB, CKMBINDEX, TROPONINI in the last 168 hours. BNP (last 3 results)  Recent Labs  07/30/16 1156  PROBNP 272   HbA1C: No results for input(s): HGBA1C in the last 72 hours. CBG:  Recent Labs Lab 03/25/17 1113 03/25/17 1655 03/26/17 1236 03/26/17 1308 03/26/17 1324  GLUCAP 111* 117* 53* 45* 83   Lipid Profile: No results for input(s): CHOL, HDL, LDLCALC, TRIG, CHOLHDL, LDLDIRECT in the last 72 hours. Thyroid Function Tests: No results for input(s): TSH, T4TOTAL, FREET4, T3FREE, THYROIDAB in the last 72 hours. Anemia Panel: No results for input(s): VITAMINB12, FOLATE, FERRITIN, TIBC, IRON, RETICCTPCT in the last 72 hours. Urine analysis:    Component Value Date/Time   COLORURINE YELLOW 03/19/2017 1934   APPEARANCEUR HAZY (A) 03/19/2017 1934   LABSPEC 1.006  03/19/2017 1934  PHURINE 5.0 03/19/2017 1934   GLUCOSEU NEGATIVE 03/19/2017 1934   HGBUR MODERATE (A) 03/19/2017 1934   BILIRUBINUR NEGATIVE 03/19/2017 [place] 03/19/2017 1934   PROTEINUR 30 (A) 03/19/2017 1934   UROBILINOGEN 0.2 09/09/2014 0850   NITRITE NEGATIVE 03/19/2017 [place] 03/19/2017 1934   Sepsis Labs: Invalid input(s): PROCALCITONIN, LACTICIDVEN  Recent Results (from the past 240 hour(s))  Culture, blood (routine x 2)     Status: None   Collection Time: 03/19/17  4:20 PM  Result Value Ref Range Status   Specimen Description BLOOD RIGHT ANTECUBITAL  Final   Special Requests   Final    BOTTLES DRAWN AEROBIC AND ANAEROBIC Blood Culture adequate volume   Culture NO GROWTH 5 DAYS  Final   Report Status 03/24/2017 FINAL  Final  Urine culture     Status: Abnormal   Collection Time: 03/19/17  7:34 PM  Result Value Ref Range Status   Specimen Description URINE, CLEAN CATCH  Final   Special Requests NONE  Final   Culture (A)  Final    40,000 COLONIES/mL LACTOBACILLUS SPECIES Standardized susceptibility testing for this organism is not available.    Report Status 03/21/2017 FINAL  Final  MRSA PCR Screening     Status: None   Collection Time: 03/20/17  5:01 PM  Result Value Ref Range Status   MRSA by PCR NEGATIVE NEGATIVE Final    Comment:        The GeneXpert MRSA Assay (FDA approved for NASAL specimens only), is one component of a comprehensive MRSA colonization surveillance program. It is not intended to diagnose MRSA infection nor to guide or monitor treatment for MRSA infections.       Radiology Studies: No results found.   Scheduled Meds: . calcitRIOL  2 mcg Oral Daily  . calcium carbonate  2 tablet Oral BID WC  . cyanocobalamin  1,000 mcg Intramuscular Q8 Weeks  . darbepoetin (ARANESP) injection - DIALYSIS  200 mcg Intravenous Q Sat-HD  . fluticasone furoate-vilanterol  1 puff Inhalation Daily  . levothyroxine   125 mcg Oral QAC breakfast  . multivitamin  1 tablet Oral QHS  . potassium chloride  40 mEq Oral Daily  . predniSONE  5 mg Oral Q breakfast  . umeclidinium bromide  1 puff Inhalation Daily   Continuous Infusions: . sodium chloride 10 mL/hr at 03/22/17 1928     Marzetta Board, MD, PhD [facility] Pager [phone]  If 7PM-7AM, please contact night-coverage [email or web address] Password [email or web address] 03/26/2017, 2:16 PM

## 2017-03-26 NOTE — Progress Notes (Signed)
S: No complaints  O:BP 105/60   Pulse 91   Temp (!) 97 F (36.1 C)   Resp 16   Ht 5' 3"  (1.6 m)   Wt 45.5 kg (100 lb 5 oz)   SpO2 98%   BMI 17.77 kg/m   Intake/Output Summary (Last 24 hours) at 03/26/17 1252 Last data filed at 03/26/17 1104  Gross per 24 hour  Intake              180 ml  Output                0 ml  Net              180 ml   Intake/Output: I/O last 3 completed shifts: In: 300 [P.O.:300] Out: 0   Intake/Output this shift:  No intake/output data recorded. Weight change: -0.3 kg (-10.6 oz) Gen: Well appearing, no acute distress  CVS: RRR, no murmur appreciated  Resp:lungs clear to auscultation  Abd: soft, non tender, non distended  Ext: no peripheral edema, R IJ tunneled HD, L LUE AVF +T/B   Recent Labs Lab 03/19/17 1453 03/20/17 0608 03/20/17 1230 03/21/17 0256  03/22/17 0142 03/22/17 0957 03/22/17 1255 03/23/17 0459 03/24/17 1000 03/25/17 0231 03/26/17 0513  NA 141 142  --  142  < > 144 145 142 143 142 141 138  K 2.4* 3.0*  --  2.6*  < > 3.1* 3.4* 3.2* 3.1* 3.3* 3.0* 3.7  CL 118* 119*  --  115*  < > 111 112* 110 114* 113* 111 109  CO2 9* <7*  --  18*  < > 26 25 25 23  21* 22 20*  GLUCOSE 97 44*  --  113*  < > 85 123* 94 81 77 73 69  BUN 68* 70*  --  35*  < > 13 14 13 15 16 9 14   CREATININE 8.46* 7.98*  --  4.78*  < > 3.19* 3.65* 3.82* 4.41* 4.84* 3.00* 3.89*  ALBUMIN 3.1* 2.8*  --  2.2*  --  2.4* 2.4* 2.4* 2.4* 2.5* 2.3*  --   CALCIUM 5.4* 5.4*  --  5.9*  < > 7.0* 7.0* 6.8* 7.0* 7.9* 8.1* 8.5*  PHOS  --   --  5.7* 2.5  --   --  1.1* 1.4* 1.7* 3.0 3.2  --   AST 20 17  --   --   --  19  --   --   --   --   --   --   ALT 13* 11*  --   --   --  12*  --   --   --   --   --   --   < > = values in this interval not displayed. Liver Function Tests:  Recent Labs Lab 03/19/17 1453 03/20/17 0608  03/22/17 0142  03/23/17 0459 03/24/17 1000 03/25/17 0231  AST 20 17  --  19  --   --   --   --   ALT 13* 11*  --  12*  --   --   --   --   ALKPHOS  115 105  --  71  --   --   --   --   BILITOT 0.6 0.7  --  0.7  --   --   --   --   PROT 6.2* 5.7*  --  4.6*  --   --   --   --  ALBUMIN 3.1* 2.8*  < > 2.4*  < > 2.4* 2.5* 2.3*  < > = values in this interval not displayed.  Recent Labs Lab 03/19/17 1453  LIPASE 172*   No results for input(s): AMMONIA in the last 168 hours. CBC:  Recent Labs Lab 03/20/17 0608 03/22/17 1255 03/23/17 0459 03/24/17 1000 03/26/17 0513  WBC 7.5 7.3 9.4 8.9 9.3  HGB 8.2* 6.5* 8.6* 8.7* 8.9*  HCT 25.1* 20.2* 26.7* 27.6* 29.1*  MCV 95.1 94.4 94.3 95.5 98.6  PLT 145* 122* 103* PLATELET CLUMPS NOTED ON SMEAR, COUNT APPEARS DECREASED 121*   Cardiac Enzymes: No results for input(s): CKTOTAL, CKMB, CKMBINDEX, TROPONINI in the last 168 hours. CBG:  Recent Labs Lab 03/24/17 2020 03/25/17 0800 03/25/17 1113 03/25/17 1655 03/26/17 1236  GLUCAP 83 61* 111* 117* 53*    Iron Studies: No results for input(s): IRON, TIBC, TRANSFERRIN, FERRITIN in the last 72 hours. Studies/Results: No results found. . calcitRIOL  2 mcg Oral Daily  . calcium carbonate  2 tablet Oral BID WC  . cyanocobalamin  1,000 mcg Intramuscular Q8 Weeks  . darbepoetin (ARANESP) injection - DIALYSIS  200 mcg Intravenous Q Sat-HD  . fluticasone furoate-vilanterol  1 puff Inhalation Daily  . levothyroxine  125 mcg Oral QAC breakfast  . multivitamin  1 tablet Oral QHS  . potassium chloride  40 mEq Oral Daily  . predniSONE  5 mg Oral Q breakfast  . umeclidinium bromide  1 puff Inhalation Daily    BMET    Component Value Date/Time   NA 138 03/26/2017 0513   NA 142 07/30/2016 1156   K 3.7 03/26/2017 0513   CL 109 03/26/2017 0513   CO2 20 (L) 03/26/2017 0513   GLUCOSE 69 03/26/2017 0513   BUN 14 03/26/2017 0513   BUN 31 (H) 07/30/2016 1156   CREATININE 3.89 (H) 03/26/2017 0513   CALCIUM 8.5 (L) 03/26/2017 0513   CALCIUM 5.9 (LL) 03/29/2014 2304   GFRNONAA 11 (L) 03/26/2017 0513   GFRAA 13 (L) 03/26/2017 0513   CBC     Component Value Date/Time   WBC 9.3 03/26/2017 0513   RBC 2.95 (L) 03/26/2017 0513   HGB 8.9 (L) 03/26/2017 0513   HCT 29.1 (L) 03/26/2017 0513   PLT 121 (L) 03/26/2017 0513   MCV 98.6 03/26/2017 0513   MCH 30.2 03/26/2017 0513   MCHC 30.6 03/26/2017 0513   RDW 16.9 (H) 03/26/2017 0513   LYMPHSABS 2.7 07/15/2016 0549   MONOABS 0.7 07/15/2016 0549   EOSABS 0.2 07/15/2016 0549   BASOSABS 0.0 07/15/2016 0549     Assessment/Plan:  1. ESRD -  TTS schedule SNF placement and CLIP in process She will need to f/u with [facility] after discharge for stage 2 of brachial AVF.  2. Anemia of CKD - on aranesp, hgb improving 3. Mineral metabolic disorder - on vitamin D, calcium, calcium improving  4. Hypokalemia 2/2 diarrhea- unimproved, K-dur increased today  5. Refeeding syndrome - phos improved with regular diet  6. Hypomagnesemia- repleting 7. Crohns disease - per primary  8. Weakness- expresses concern for being able to get around in her home, SNF placement being arranged   Madelon Lips MD [place] pgr [phone]

## 2017-03-26 NOTE — Procedures (Signed)
Patient seen and examined on Hemodialysis this AM QB 300 UF goal even.  Tolerating well.  Treatment adjusted as needed.  Madelon Lips MD [facility] pgr [phone] 12:54 PM

## 2017-03-26 NOTE — Progress Notes (Deleted)
    Postoperative Access Visit   History of Present Illness   DANITY SCHMELZER is a 71 y.o. year old female who presents for postoperative follow-up for: left first stage brachial vein transposition (Date: 01/09/17).  The patient's wounds are *** healed.  The patient notes *** steal symptoms.  The patient is *** able to complete their activities of daily living.  The patient's current symptoms are: ***.   Physical Examination  ***There were no vitals filed for this visit.   left arm Incision is *** healed, skin feels ***, hand grip is ***/5, sensation in digits is *** intact, ***palpable thrill, bruit can *** be auscultated     Medical Decision Making   EMMETT ARNTZ is a 71 y.o. year old female who presents s/p left first stage brachial vein transposition   The patient's access is *** ready for use.  ***The patient's tunneled dialysis catheter can be removed after two successful cannulations and completed dialysis treatments.  Thank you for allowing [place] to participate in this patient's care.   Adele Barthel, MD, FACS [facility] Office: [phone] Pager: [phone]

## 2017-03-27 ENCOUNTER — Ambulatory Visit: Payer: Medicare Other | Admitting: Vascular Surgery

## 2017-03-27 LAB — CBC
HCT: 27.4 % — ABNORMAL LOW (ref 36.0–46.0)
HEMOGLOBIN: 8.4 g/dL — AB (ref 12.0–15.0)
MCH: 30.7 pg (ref 26.0–34.0)
MCHC: 30.7 g/dL (ref 30.0–36.0)
MCV: 100 fL (ref 78.0–100.0)
Platelets: 102 10*3/uL — ABNORMAL LOW (ref 150–400)
RBC: 2.74 MIL/uL — AB (ref 3.87–5.11)
RDW: 17.5 % — ABNORMAL HIGH (ref 11.5–15.5)
WBC: 9.2 10*3/uL (ref 4.0–10.5)

## 2017-03-27 LAB — BASIC METABOLIC PANEL
ANION GAP: 6 (ref 5–15)
BUN: 9 mg/dL (ref 6–20)
CHLORIDE: 111 mmol/L (ref 101–111)
CO2: 21 mmol/L — ABNORMAL LOW (ref 22–32)
Calcium: 8.6 mg/dL — ABNORMAL LOW (ref 8.9–10.3)
Creatinine, Ser: 2.97 mg/dL — ABNORMAL HIGH (ref 0.44–1.00)
GFR calc Af Amer: 17 mL/min — ABNORMAL LOW (ref >60)
GFR, EST NON AFRICAN AMERICAN: 15 mL/min — AB (ref >60)
Glucose, Bld: 73 mg/dL (ref 65–99)
Potassium: 4.3 mmol/L (ref 3.5–5.1)
SODIUM: 138 mmol/L (ref 135–145)

## 2017-03-27 LAB — GLUCOSE, CAPILLARY
GLUCOSE-CAPILLARY: 127 mg/dL — AB (ref 65–99)
GLUCOSE-CAPILLARY: 90 mg/dL (ref 65–99)
GLUCOSE-CAPILLARY: 93 mg/dL (ref 65–99)
Glucose-Capillary: 71 mg/dL (ref 65–99)

## 2017-03-27 MED ORDER — LOPERAMIDE HCL 2 MG PO CAPS
4.0000 mg | ORAL_CAPSULE | ORAL | 0 refills | Status: DC | PRN
Start: 2017-03-27 — End: 2020-02-21

## 2017-03-27 MED ORDER — LOPERAMIDE HCL 2 MG PO CAPS
4.0000 mg | ORAL_CAPSULE | ORAL | Status: DC | PRN
  Administered 2017-03-27: 4 mg via ORAL
  Filled 2017-03-27: qty 2

## 2017-03-27 MED ORDER — PRO-STAT SUGAR FREE PO LIQD: 30.0000 mL | Freq: Two times a day (BID) | ORAL | Status: DC

## 2017-03-27 MED ORDER — PANTOPRAZOLE SODIUM 40 MG PO TBEC
40.0000 mg | DELAYED_RELEASE_TABLET | Freq: Every day | ORAL | Status: DC
  Administered 2017-03-27: 40 mg via ORAL
  Filled 2017-03-27: qty 1

## 2017-03-27 NOTE — Discharge Summary (Signed)
Physician Discharge Summary  Natasha [name] ZJQ:[id number] DOB: 08/05/1945 DOA: 03/19/2017  PCP: [name], MD  Admit date: 03/19/2017 Discharge date: 03/27/2017  Admitted From: home Disposition:  SNF  Recommendations for Outpatient Follow-up:  1. Follow up with HD as scheduled. SHe was accepted at [facility] .1st treatment Monday 03/30/17 at 12:pm .Schedule is Monday,Wednesday,Friday 2nd shift .Chairtime 1:00 pm  Home Health: none Equipment/Devices: none  Discharge Condition: stable CODE STATUS: Full code Diet recommendation: renal  HPI: Per Dr. Shanon Brow, LAKECHIA NAY is a 71 y.o. female with medical history significant of crohns on humara with recent fistula formation sent to [facility] by dr schooler, ckd not on dialysis yet, PVD comes in with one week of worsening n/v/d and generalized abdominal pain consistent with her routine crohns flare.  No fevers.  She has been feeling awful.  Pt primary gi doc is dr Soil scientist and she was recently set up to go to [facility] because she has developed complications despite being on humara for 2 years.  Pt also has nondialysis CKD with baseline cr of around 4.  It is now at 8 with k level of 2.4.  Pt very dry.  Pt referred for admission for her AKI and probable crohns flare.  No imaging of abdomen done yet and is pending.  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  She was initiated on hemodialysis per nephrology.  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 Crohn's flare ruled out -4 months the patient has been feeling nausea, skins findings, malaise, vomiting, abdominal cramps, increased stools, which she and her GI at [facility] had been unsure if they were from Crohn's disease. Appears these were actually from uremia, Due for Humira on Saturday Nov 3, patient will bring the medication from home and it is to be administered at the SNF Seizure -Provoked in setting of uremia, electrolyte derangements. No previous epilepsy. None since. Hypocalcemia and Hypokalemia and Hypomagnesemia -Per Neph and HD COPD -Continue inhalers Hypothyroidism -Continue levothyroxine   Discharge Instructions   Allergies as of 03/27/2017      Reactions   Mercaptopurine Other (See Comments)   Caused pancreatitis   Remicade [infliximab] Other (See Comments)   CAUSED JOINT PAIN      Medication List    STOP taking these medications   furosemide 20 MG tablet Commonly known as:  LASIX     TAKE these medications   acetaminophen 325 MG tablet Commonly known as:  TYLENOL Take 325 mg by mouth every 6 (six) hours as needed for mild pain.   albuterol 108 (90 Base) MCG/ACT inhaler Commonly known as:  PROVENTIL HFA;VENTOLIN HFA Inhale 2 puffs into the lungs every 6 (six) hours as needed for wheezing or shortness of breath.   alendronate 70 MG tablet Commonly known  as:  FOSAMAX Take 70 mg by mouth once a week. Mondays   calcitRIOL 0.25 MCG capsule Commonly known as:  ROCALTROL Take 0.5 mcg by mouth daily.   calcium-vitamin D 500-200 MG-UNIT tablet Commonly known as:  OSCAL WITH D Take 1 tablet by mouth 2 (two) times daily.   cyanocobalamin 1000 MCG/ML injection Commonly known as:  (VITAMIN B-12) Inject 1,000  mcg into the muscle See admin instructions. Every 60 days   Fluticasone-Umeclidin-Vilant 100-62.5-25 MCG/INH Aepb Commonly known as:  TRELEGY ELLIPTA Inhale 1 puff into the lungs daily.   folic acid 1 MG tablet Commonly known as:  FOLVITE Take 1 mg by mouth daily.   HUMIRA 40 MG/0.8ML Pskt Generic drug:  Adalimumab Inject 40 mg into the skin every 14 (fourteen) days.   levothyroxine 125 MCG tablet Commonly known as:  SYNTHROID, LEVOTHROID Take 125 mcg by mouth daily before breakfast.   loperamide 2 MG capsule Commonly known as:  IMODIUM Take 2 capsules (4 mg total) by mouth as needed for diarrhea or loose stools.   nitroGLYCERIN 0.4 MG SL tablet Commonly known as:  NITROSTAT Place 1 tablet (0.4 mg total) under the tongue every 5 (five) minutes as needed for chest pain.   potassium chloride SA 20 MEQ tablet Commonly known as:  K-DUR,KLOR-CON Take 40 mEq by mouth 2 (two) times daily with a meal.   predniSONE 5 MG tablet Commonly known as:  DELTASONE Take 5 mg by mouth daily with breakfast.   promethazine 25 MG tablet Commonly known as:  PHENERGAN Take 25 mg by mouth every 6 (six) hours as needed for nausea or vomiting.   sodium bicarbonate 650 MG tablet Take 2 tablets (1,300 mg total) by mouth 2 (two) times daily. What changed:  how much to take   traMADol 50 MG tablet Commonly known as:  ULTRAM Take 1-2 tablets (50-100 mg total) by mouth every 6 (six) hours as needed for severe pain.   Vitamin D3 2000 units Tabs Take 2,000 Units by mouth 2 (two) times daily.       Consultations:  Nephrology   Procedures/Studies:  Ct Abdomen Pelvis Wo Contrast  Result Date: 03/20/2017 CLINICAL DATA:  Acute onset of generalized abdominal pain, nausea and diarrhea. Initial encounter. EXAM: CT ABDOMEN AND PELVIS WITHOUT CONTRAST TECHNIQUE: Multidetector CT imaging of the abdomen and pelvis was performed following the standard protocol without IV contrast. COMPARISON:  CT of the  abdomen and pelvis performed 11/18/2016 FINDINGS: Lower chest: Minimal right basilar atelectasis is noted. The visualized portions of the mediastinum are unremarkable. Hepatobiliary: The liver is unremarkable in appearance. The patient is status post cholecystectomy, with clips noted at the gallbladder fossa. The common bile duct remains normal in caliber. Pancreas: The pancreas is within normal limits. Spleen: The spleen is unremarkable in appearance. Adrenals/Urinary Tract: The adrenal glands are unremarkable in appearance. Nonobstructing bilateral renal stones measure up to 7 mm in size. There is no evidence of hydronephrosis. No obstructing ureteral stones are seen. A right renal cyst is noted. No significant perinephric stranding is appreciated. Stomach/Bowel: The patient's ileocolic anastomosis is unremarkable in appearance. The remaining colon is within normal limits. Contrast has progressed to the transverse and descending colon. The small bowel is grossly unremarkable. Mild soft tissue inflammation is noted about the gastric fundus, raising question for mild gastritis. Vascular/Lymphatic: Scattered calcification is seen along the abdominal aorta and its branches. The abdominal aorta is otherwise grossly unremarkable. The inferior vena cava is grossly unremarkable. No retroperitoneal lymphadenopathy  is seen. No pelvic sidewall lymphadenopathy is identified. Reproductive: The bladder is moderately distended and grossly unremarkable. The patient is status post hysterectomy. No suspicious adnexal masses are seen. Other: No additional soft tissue abnormalities are seen. Musculoskeletal: No acute osseous abnormalities are identified. The visualized musculature is unremarkable in appearance. IMPRESSION: 1. Mild soft tissue inflammation about the gastric fundus, raising question for mild gastritis. Would correlate with the patient's symptoms. 2. Scattered aortic atherosclerosis. 3. Nonobstructing bilateral renal  stones measure up to 7 mm in size. Right renal cysts noted. Electronically Signed   By: Garald Balding M.D.   On: 03/20/2017 03:13   Ir Fluoro Guide Cv Line Right  Result Date: 03/21/2017 CLINICAL DATA:  Renal insufficiency, needs venous access for hemodialysis. EXAM: TUNNELED HEMODIALYSIS CATHETER PLACEMENT WITH ULTRASOUND AND FLUOROSCOPIC GUIDANCE TECHNIQUE: The procedure, risks, benefits, and alternatives were explained to the patient. Questions regarding the procedure were encouraged and answered. The patient understands and consents to the procedure. Patency of the right IJ vein was confirmed with ultrasound with image documentation. An appropriate skin site was determined. Region was prepped using maximum barrier technique including cap and mask, sterile gown, sterile gloves, large sterile sheet, and Chlorhexidine as cutaneous antisepsis. The region was infiltrated locally with 1% lidocaine. Intravenous Fentanyl and Versed were administered as conscious sedation during continuous monitoring of the patient's level of consciousness and physiological / cardiorespiratory status by the radiology RN, with a total moderate sedation time of 20 minutes. Under real-time ultrasound guidance, the right IJ vein was accessed with a 21 gauge micropuncture needle; the needle tip within the vein was confirmed with ultrasound image documentation. Needle exchanged over the 018 guidewire for transitional dilator, which allowed advancement of a Benson wire into the IVC. Over this, an MPA catheter was advanced. A Hemosplit 19 hemodialysis catheter was tunneled from the right anterior chest wall approach to the right IJ dermatotomy site. The MPA catheter was exchanged over an Amplatz wire for serial vascular dilators which allow placement of a peel-away sheath, through which the catheter was advanced under intermittent fluoroscopy, positioned with its tips in the proximal and midright atrium. Spot chest radiograph confirms good  catheter position. No pneumothorax. Catheter was flushed and primed per protocol. Catheter secured externally with O Prolene sutures. A 3-0 Monocryl purse-string suture was placed around the skin entry site to control mild venous oozing. The right IJ dermatotomy site was closed with Dermabond. COMPLICATIONS: COMPLICATIONS None immediate FLUOROSCOPY TIME:  10 seconds COMPARISON:  None IMPRESSION: 1. Technically successful placement of tunneled right IJ hemodialysis catheter with ultrasound and fluoroscopic guidance. Ready for routine use. ACCESS: Remains approachable for percutaneous intervention as needed. Electronically Signed   By: Lucrezia Europe M.D.   On: 03/21/2017 14:20   Ir US Guide Vasc Access Right  Result Date: 03/21/2017 CLINICAL DATA:  Renal insufficiency, needs venous access for hemodialysis. EXAM: TUNNELED HEMODIALYSIS CATHETER PLACEMENT WITH ULTRASOUND AND FLUOROSCOPIC GUIDANCE TECHNIQUE: The procedure, risks, benefits, and alternatives were explained to the patient. Questions regarding the procedure were encouraged and answered. The patient understands and consents to the procedure. Patency of the right IJ vein was confirmed with ultrasound with image documentation. An appropriate skin site was determined. Region was prepped using maximum barrier technique including cap and mask, sterile gown, sterile gloves, large sterile sheet, and Chlorhexidine as cutaneous antisepsis. The region was infiltrated locally with 1% lidocaine. Intravenous Fentanyl and Versed were administered as conscious sedation during continuous monitoring of the patient's level of consciousness and physiological /  cardiorespiratory status by the radiology RN, with a total moderate sedation time of 20 minutes. Under real-time ultrasound guidance, the right IJ vein was accessed with a 21 gauge micropuncture needle; the needle tip within the vein was confirmed with ultrasound image documentation. Needle exchanged over the 018  guidewire for transitional dilator, which allowed advancement of a Benson wire into the IVC. Over this, an MPA catheter was advanced. A Hemosplit 19 hemodialysis catheter was tunneled from the right anterior chest wall approach to the right IJ dermatotomy site. The MPA catheter was exchanged over an Amplatz wire for serial vascular dilators which allow placement of a peel-away sheath, through which the catheter was advanced under intermittent fluoroscopy, positioned with its tips in the proximal and midright atrium. Spot chest radiograph confirms good catheter position. No pneumothorax. Catheter was flushed and primed per protocol. Catheter secured externally with O Prolene sutures. A 3-0 Monocryl purse-string suture was placed around the skin entry site to control mild venous oozing. The right IJ dermatotomy site was closed with Dermabond. COMPLICATIONS: COMPLICATIONS None immediate FLUOROSCOPY TIME:  10 seconds COMPARISON:  None IMPRESSION: 1. Technically successful placement of tunneled right IJ hemodialysis catheter with ultrasound and fluoroscopic guidance. Ready for routine use. ACCESS: Remains approachable for percutaneous intervention as needed. Electronically Signed   By: Lucrezia Europe M.D.   On: 03/21/2017 14:20     Subjective: - no chest pain, shortness of breath, no abdominal pain, nausea or vomiting.   Discharge Exam: Vitals:   03/27/17 0756 03/27/17 1057  BP:  (!) 97/57  Pulse:  (!) 109  Resp:  19  Temp:  98.5 F (36.9 C)  SpO2: 93% 97%    General: Pt is alert, awake, not in acute distress Cardiovascular: RRR, S1/S2 +, no rubs, no gallops Respiratory: CTA bilaterally, no wheezing, no rhonchi Abdominal: Soft, NT, ND, bowel sounds + Extremities: no edema, no cyanosis    The results of significant diagnostics from this hospitalization (including imaging, microbiology, ancillary and laboratory) are listed below for reference.     Microbiology: Recent Results (from the past 240  hour(s))  Culture, blood (routine x 2)     Status: None   Collection Time: 03/19/17  4:20 PM  Result Value Ref Range Status   Specimen Description BLOOD RIGHT ANTECUBITAL  Final   Special Requests   Final    BOTTLES DRAWN AEROBIC AND ANAEROBIC Blood Culture adequate volume   Culture NO GROWTH 5 DAYS  Final   Report Status 03/24/2017 FINAL  Final  Urine culture     Status: Abnormal   Collection Time: 03/19/17  7:34 PM  Result Value Ref Range Status   Specimen Description URINE, CLEAN CATCH  Final   Special Requests NONE  Final   Culture (A)  Final    40,000 COLONIES/mL LACTOBACILLUS SPECIES Standardized susceptibility testing for this organism is not available.    Report Status 03/21/2017 FINAL  Final  MRSA PCR Screening     Status: None   Collection Time: 03/20/17  5:01 PM  Result Value Ref Range Status   MRSA by PCR NEGATIVE NEGATIVE Final    Comment:        The GeneXpert MRSA Assay (FDA approved for NASAL specimens only), is one component of a comprehensive MRSA colonization surveillance program. It is not intended to diagnose MRSA infection nor to guide or monitor treatment for MRSA infections.      Labs: BNP (last 3 results) No results for input(s): BNP in the last 8760  hours. Basic Metabolic Panel:  Recent Labs Lab 03/21/17 0256 03/21/17 2135  03/22/17 0957 03/22/17 1255 03/23/17 0459 03/24/17 1000 03/25/17 0231 03/26/17 0513 03/27/17 0340  NA 142 144  < > 145 142 143 142 141 138 138  K 2.6* 2.8*  < > 3.4* 3.2* 3.1* 3.3* 3.0* 3.7 4.3  CL 115* 112*  < > 112* 110 114* 113* 111 109 111  CO2 18* 24  < > 25 25 23  21* 22 20* 21*  GLUCOSE 113* 128*  < > 123* 94 81 77 73 69 73  BUN 35* 13  < > 14 13 15 16 9 14 9   CREATININE 4.78* 3.04*  < > 3.65* 3.82* 4.41* 4.84* 3.00* 3.89* 2.97*  CALCIUM 5.9* 7.0*  < > 7.0* 6.8* 7.0* 7.9* 8.1* 8.5* 8.6*  MG 1.6* 1.8  --   --   --  1.4* 1.2* 1.4*  --   --   PHOS 2.5  --   --  1.1* 1.4* 1.7* 3.0 3.2  --   --   < > = values  in this interval not displayed. Liver Function Tests:  Recent Labs Lab 03/22/17 0142 03/22/17 0957 03/22/17 1255 03/23/17 0459 03/24/17 1000 03/25/17 0231  AST 19  --   --   --   --   --   ALT 12*  --   --   --   --   --   ALKPHOS 71  --   --   --   --   --   BILITOT 0.7  --   --   --   --   --   PROT 4.6*  --   --   --   --   --   ALBUMIN 2.4* 2.4* 2.4* 2.4* 2.5* 2.3*   No results for input(s): LIPASE, AMYLASE in the last 168 hours. No results for input(s): AMMONIA in the last 168 hours. CBC:  Recent Labs Lab 03/22/17 1255 03/23/17 0459 03/24/17 1000 03/26/17 0513 03/27/17 0340  WBC 7.3 9.4 8.9 9.3 9.2  HGB 6.5* 8.6* 8.7* 8.9* 8.4*  HCT 20.2* 26.7* 27.6* 29.1* 27.4*  MCV 94.4 94.3 95.5 98.6 100.0  PLT 122* 103* PLATELET CLUMPS NOTED ON SMEAR, COUNT APPEARS DECREASED 121* 102*   Cardiac Enzymes: No results for input(s): CKTOTAL, CKMB, CKMBINDEX, TROPONINI in the last 168 hours. BNP: Invalid input(s): POCBNP CBG:  Recent Labs Lab 03/26/17 1324 03/26/17 1708 03/26/17 2058 03/27/17 0805 03/27/17 1222  GLUCAP 83 137* 105* 71 127*   D-Dimer No results for input(s): DDIMER in the last 72 hours. Hgb A1c No results for input(s): HGBA1C in the last 72 hours. Lipid Profile No results for input(s): CHOL, HDL, LDLCALC, TRIG, CHOLHDL, LDLDIRECT in the last 72 hours. Thyroid function studies No results for input(s): TSH, T4TOTAL, T3FREE, THYROIDAB in the last 72 hours.  Invalid input(s): FREET3 Anemia work up No results for input(s): VITAMINB12, FOLATE, FERRITIN, TIBC, IRON, RETICCTPCT in the last 72 hours. Urinalysis    Component Value Date/Time   COLORURINE YELLOW 03/19/2017 1934   APPEARANCEUR HAZY (A) 03/19/2017 1934   LABSPEC 1.006 03/19/2017 1934   PHURINE 5.0 03/19/2017 1934   GLUCOSEU NEGATIVE 03/19/2017 1934   HGBUR MODERATE (A) 03/19/2017 1934   BILIRUBINUR NEGATIVE 03/19/2017 [place] 03/19/2017 1934   PROTEINUR 30 (A) 03/19/2017  1934   UROBILINOGEN 0.2 09/09/2014 0850   NITRITE NEGATIVE 03/19/2017 1934   LEUKOCYTESUR NEGATIVE 03/19/2017 1934   Sepsis Labs Invalid  input(s): PROCALCITONIN,  WBC,  LACTICIDVEN   Time coordinating discharge: 35 minutes  SIGNED:  Marzetta Board, MD  [facility] 03/27/2017, 3:38 PM Pager [phone]  If 7PM-7AM, please contact night-coverage [email or web address] Password [email or web address]

## 2017-03-27 NOTE — Progress Notes (Signed)
Accepted at [facility] .1st treatment Monday 03/30/17 at 12:pm .Schedule is Monday,Wednesday,Friday 2nd shift .Chairtime 1:00 pm

## 2017-03-27 NOTE — Clinical Social Work Note (Signed)
Clinical Social Work Assessment  Patient Details  Name: Natasha [name] MRN: [id number] Date of Birth: 1945/08/25  Date of referral:  03/27/17               Reason for consult:  Facility Placement                Permission sought to share information with:  Family Supports Permission granted to share information::  No (Verbal permission not given, however patient's husband was in the room Friday afternoon when patient gave facility decision and advised that facility could take her 11/2.)  Name::     Shawny Borkowski  Agency::     Relationship::  Husband  Contact Information:  [phone]  Housing/Transportation Living arrangements for the past 2 months:  Single Family Home Source of Information:  Patient Patient Interpreter Needed:  None Criminal Activity/Legal Involvement Pertinent to Current Situation/Hospitalization:  No - Comment as needed Significant Relationships:  Adult Children, Spouse, Other Family Members Lives with:  Spouse Do you feel safe going back to the place where you live?  No (Patient in agreement with ST rehab prior to returning home) Need for family participation in patient care:  Yes (Comment)  Care giving concerns:  Patient agreeable to ST rehab and expressed that she wanted to go home and have her husband look after her, however she realized after falling at home and having a seizure with husband in the home, that rehab is the best decision.  Social Worker assessment / plan: CSW talked with patient at the bedside earlier today regarding discharge plan and recommendation of ST rehab. Ms. Cunanan was sitting up in bed and was alert and oriented, pleasant and engaged easily with CSW. Patient informed CSW that she and her husband live alone and she has 3 children: 2 sons and 1 daughter and one of her sons lives in [place], [place] and is coming to [place] this weekend and her daughter and oldest son live in [place]. Patient expressed that she is very weak and  currently cannot get out of bed or walk without assistance. She is in agreement with short-term rehab and informed CSW that she has never been to rehab before so needed information provided to patient.   CSW met with patient again after 4 pm and her husband was at the bedside. He did not express disagreement with rehab but voiced concern regarding where she would be going and leaving the hospital late.   Employment status:  Retired Forensic scientist:  [employer]) PT Recommendations:  [place] / Referral to community resources:  [place] (Patient provided with SNF list and facility responses)  Patient/Family's Response to care:  Patient nor husband expressed concern regarding her care during hospitalization.  Patient/Family's Understanding of and Emotional Response to Diagnosis, Current Treatment, and Prognosis:  Patient expressed understanding regarding her health issues, hospitalization and the need for rehab.  Emotional Assessment Appearance:  Appears younger than stated age Attitude/Demeanor/Rapport:  Other (Appropriate) Affect (typically observed):  Pleasant, Appropriate Orientation:  Oriented to Self, Oriented to Place, Oriented to  Time, Oriented to Situation Alcohol / Substance use:  Tobacco Use, Alcohol Use, Illicit Drugs (Patient reported that she quit smoking and does not drink or use illicit drugs) Psych involvement (Current and /or in the community):  No (Comment)  Discharge Needs  Concerns to be addressed:  Discharge Planning Concerns Readmission within the last 30 days:  No Current discharge risk:  None Barriers to Discharge:  No  Barriers Identified   Sable Feil, LCSW 03/27/2017, 5:52 PM

## 2017-03-27 NOTE — Progress Notes (Signed)
Nutrition Follow-up  DOCUMENTATION CODES:   Severe malnutrition in context of chronic illness, Underweight  INTERVENTION:   -Add Snacks between meals  -D/C Boost Breeze po TID prn, each supplement provides 250 kcal and 9 grams of protein, as pt not drinking. Reports it gives her diarrhea  -Provided initial brief education on dialysis diet. Focused mostly on importance of adequate nutrition, increasing protein intake, fluid and sodium restriction  -Pro-Stat 30 mL BID  NUTRITION DIAGNOSIS:   Severe Malnutrition related to chronic illness (Crohn's disease, CKD) as evidenced by severe fat depletion, severe muscle depletion.  Continues but being addressed via snacks, supplement  GOAL:   Patient will meet greater than or equal to 90% of their needs  Progressing  MONITOR:   PO intake, Supplement acceptance, Weight trends, Labs  REASON FOR ASSESSMENT:   Malnutrition Screening Tool    ASSESSMENT:   71 year old female with PMH of Crohn's DZ with anal fistula, CKD, PVD, COPD, SBO, pancreatitis, hypothyroidism, HTN, and GERD who was admitted on 10/25 with Crohn's flare-up.  Crohn's flare ruled out; appears sypmtoms of N/V/D/abdominal pain, malaise from uremia  Started on HD; pt reports appetite greatly improved post initiation of HD. Pt eating 75-100% of meals.  Pt tried one Hormel Shake and reports diarrhea. Pt reports Boost Breeze, Ensure, Boost Plus all cause diarrhea. Pt agreeable to snacks between meals to optimize nutritional intake.    Labs: reviewed, potassium wdl (L previously and currently on supplementation), phosphorus wdl Meds: Rena-Vit  Diet Order:  Diet regular Room service appropriate? Yes; Fluid consistency: Thin; Fluid restriction: 1200 mL Fluid  EDUCATION NEEDS:   Not appropriate for education at this time  Skin:  Skin Assessment: Skin Integrity Issues: Skin Integrity Issues:: Other (Comment) Other: MASD to sacrum  Last BM:  10/25  Height:   Ht  Readings from Last 1 Encounters:  03/20/17 5' 3"  (1.6 m)    Weight:   Wt Readings from Last 1 Encounters:  03/26/17 100 lb 5 oz (45.5 kg)    Ideal Body Weight:  52.3 kg  BMI:  Body mass index is 17.77 kg/m.  Estimated Nutritional Needs:   Kcal:  1500-1700  Protein:  75-90 gm  Fluid:  1.5-1.7 L   Kerman Passey MS, RD, LDN, CNSC [phone] Pager  [phone] Weekend/On-Call Pager

## 2017-03-27 NOTE — Progress Notes (Signed)
Late Entry: Pt was discharged to [facility] around 2145. AVS was given to PTAR. Vitals were stable and IV was already removed.   Eleanora Neighbor, RN

## 2017-03-27 NOTE — Progress Notes (Signed)
Natasha [name] to be D/C'd Skilled nursing facility per MD order.  Discussed prescriptions and follow up appointments with the patient. Prescriptions given to patient, medication list explained in detail. Pt verbalized understanding.  Allergies as of 03/27/2017      Reactions   Mercaptopurine Other (See Comments)   Caused pancreatitis   Remicade [infliximab] Other (See Comments)   CAUSED JOINT PAIN      Medication List    STOP taking these medications   furosemide 20 MG tablet Commonly known as:  LASIX     TAKE these medications   acetaminophen 325 MG tablet Commonly known as:  TYLENOL Take 325 mg by mouth every 6 (six) hours as needed for mild pain.   albuterol 108 (90 Base) MCG/ACT inhaler Commonly known as:  PROVENTIL HFA;VENTOLIN HFA Inhale 2 puffs into the lungs every 6 (six) hours as needed for wheezing or shortness of breath.   alendronate 70 MG tablet Commonly known as:  FOSAMAX Take 70 mg by mouth once a week. Mondays   calcitRIOL 0.25 MCG capsule Commonly known as:  ROCALTROL Take 0.5 mcg by mouth daily.   calcium-vitamin D 500-200 MG-UNIT tablet Commonly known as:  OSCAL WITH D Take 1 tablet by mouth 2 (two) times daily.   cyanocobalamin 1000 MCG/ML injection Commonly known as:  (VITAMIN B-12) Inject 1,000 mcg into the muscle See admin instructions. Every 60 days   Fluticasone-Umeclidin-Vilant 100-62.5-25 MCG/INH Aepb Commonly known as:  TRELEGY ELLIPTA Inhale 1 puff into the lungs daily.   folic acid 1 MG tablet Commonly known as:  FOLVITE Take 1 mg by mouth daily.   HUMIRA 40 MG/0.8ML Pskt Generic drug:  Adalimumab Inject 40 mg into the skin every 14 (fourteen) days.   levothyroxine 125 MCG tablet Commonly known as:  SYNTHROID, LEVOTHROID Take 125 mcg by mouth daily before breakfast.   loperamide 2 MG capsule Commonly known as:  IMODIUM Take 2 capsules (4 mg total) by mouth as needed for diarrhea or loose stools.   nitroGLYCERIN 0.4 MG SL  tablet Commonly known as:  NITROSTAT Place 1 tablet (0.4 mg total) under the tongue every 5 (five) minutes as needed for chest pain.   potassium chloride SA 20 MEQ tablet Commonly known as:  K-DUR,KLOR-CON Take 40 mEq by mouth 2 (two) times daily with a meal.   predniSONE 5 MG tablet Commonly known as:  DELTASONE Take 5 mg by mouth daily with breakfast.   promethazine 25 MG tablet Commonly known as:  PHENERGAN Take 25 mg by mouth every 6 (six) hours as needed for nausea or vomiting.   sodium bicarbonate 650 MG tablet Take 2 tablets (1,300 mg total) by mouth 2 (two) times daily. What changed:  how much to take   traMADol 50 MG tablet Commonly known as:  ULTRAM Take 1-2 tablets (50-100 mg total) by mouth every 6 (six) hours as needed for severe pain.   Vitamin D3 2000 units Tabs Take 2,000 Units by mouth 2 (two) times daily.       Vitals:   03/27/17 0756 03/27/17 1057  BP:  (!) 97/57  Pulse:  (!) 109  Resp:  19  Temp:  98.5 F (36.9 C)  SpO2: 93% 97%    Skin clean, dry and intact without evidence of skin break down, no evidence of skin tears noted. IV catheter discontinued intact. Site without signs and symptoms of complications. Pt denies pain at this time. No complaints noted.  An After Visit Summary was printed  and given to the patient. Patient escorted via bed, and D/C to [facility] via [place].  Chuck Hint RN [facility] [employer]

## 2017-03-27 NOTE — Progress Notes (Signed)
Spoke with patient about her Humira dose- next dose due 11/3.  Patient said she is fine with either having the dose here or resuming it when she discharges.  She told me HD unit told her she has a spot at [facility] for outpatient dialysis, but she is still waiting to hear what schedule it will be.  CSW was in to speak with patient about SNF placement as well.  Since discharge for patient seems to be coming soon, told her we would hold off on ordering Humira for now and she can resume at discharge. If there is a delay in her discharging, she is ok letting her husband know to bring in a Humira dose (which she said he should be able to do quickly) so we can administer to her here.  Patient very pleasant and agreeable to plan.  No Humira ordered for now. Will need to be resumed at discharge.  Jema Deegan D. Chatham Howington, PharmD, BCPS Clinical Pharmacist Pager: [phone] Clinical Phone for 03/27/2017 until 3:30pm: [phone] If after 3:30pm, please call main pharmacy at [phone] 03/27/2017 10:31 AM

## 2017-03-27 NOTE — Care Management Note (Signed)
Case Management Note  Patient Details  Name: AMARIS DELAFUENTE MRN: [id number] Date of Birth: 11-11-1945  Subjective/Objective:                 Patient with order to discharge to [facility] (SNF). SNF discharge facilitated through [facility] ([facility]). Please refer to [facility] notes for disposition plan and direct questions CSW on call accordingly. CM signing off   Action/Plan:   Expected Discharge Date:  03/27/17               Expected Discharge Plan:  [place]  In-House Referral:  Clinical Social Work  Discharge planning Services  CM Consult  Post Acute Care Choice:    Choice offered to:     DME Arranged:    DME Agency:     HH Arranged:    [place] Agency:     Status of Service:  Completed, signed off  If discussed at [employer], dates discussed:    Additional Comments:  Carles Collet, RN 03/27/2017, 4:03 PM

## 2017-03-27 NOTE — Clinical Social Work Placement (Addendum)
   CLINICAL SOCIAL WORK PLACEMENT  NOTE 03/27/17 - DISCHARGED TO [facility] VIA AMBULANCE **DIALYSIS SET UP FOR [facility], EFF. 11/5 AT 12:00 NOON. REGULAR HD TIME IS 1 PM.**    Date:  03/27/2017  Patient Details  Name: BIRDIE FETTY MRN: [id number] Date of Birth: October 25, 1945  Clinical Social Work is seeking post-discharge placement for this patient at the [place] level of care (*CSW will initial, date and re-position this form in  chart as items are completed):  Yes   Patient/family provided with [facility] Work Department's list of facilities offering this level of care within the geographic area requested by the patient (or if unable, by the patient's family).  Yes   Patient/family informed of their freedom to choose among providers that offer the needed level of care, that participate in Medicare, Medicaid or managed care program needed by the patient, have an available bed and are willing to accept the patient.  Yes   Patient/family informed of [facility] [facility]'s ownership interest in [facility] and [facility], as well as of the fact that they are under no obligation to receive care at these facilities.  PASRR submitted to EDS on 03/24/17     PASRR number received on 03/24/17     Existing PASRR number confirmed on       FL2 transmitted to all facilities in geographic area requested by pt/family on 03/24/17     FL2 transmitted to all facilities within larger geographic area on       Patient informed that his/her managed care company has contracts with or will negotiate with certain facilities, including the following:        Yes   Patient/family informed of bed offers received.  Patient chooses bed at [facility]     Physician recommends and patient chooses bed at      Patient to be transferred to [facility] on 03/27/17.  Patient to be transferred to facility by       Patient family notified on 03/27/17 of  transfer.  Name of family member notified:  Husband Delorise Shiner at the bedside     PHYSICIAN       Additional Comment:    _______________________________________________ Sable Feil, LCSW 03/27/2017, 6:06 PM

## 2017-03-27 NOTE — Progress Notes (Signed)
S: Having some diarrhea.  O:BP (!) 97/57 (BP Location: Right Arm)   Pulse (!) 109   Temp 98.5 F (36.9 C) (Oral)   Resp 19   Ht 5' 3"  (1.6 m)   Wt 45.5 kg (100 lb 5 oz)   SpO2 97%   BMI 17.77 kg/m   Intake/Output Summary (Last 24 hours) at 03/27/17 1541 Last data filed at 03/27/17 1510  Gross per 24 hour  Intake              540 ml  Output              200 ml  Net              340 ml   Intake/Output: I/O last 3 completed shifts: In: 540 [P.O.:540] Out: 0   Intake/Output this shift:  Total I/O In: 420 [P.O.:420] Out: 200 [Urine:200] Weight change: 0.4 kg (14.1 oz)   Gen: Well appearing, no acute distress  CVS: RRR, no murmur appreciated  Resp:lungs clear to auscultation  Abd: soft, non tender, non distended  Ext: no peripheral edema, R IJ tunneled HD, L LUE AVF +T/B   Recent Labs Lab 03/21/17 0256  03/22/17 0142 03/22/17 0957 03/22/17 1255 03/23/17 0459 03/24/17 1000 03/25/17 0231 03/26/17 0513 03/27/17 0340  NA 142  < > 144 145 142 143 142 141 138 138  K 2.6*  < > 3.1* 3.4* 3.2* 3.1* 3.3* 3.0* 3.7 4.3  CL 115*  < > 111 112* 110 114* 113* 111 109 111  CO2 18*  < > 26 25 25 23  21* 22 20* 21*  GLUCOSE 113*  < > 85 123* 94 81 77 73 69 73  BUN 35*  < > 13 14 13 15 16 9 14 9   CREATININE 4.78*  < > 3.19* 3.65* 3.82* 4.41* 4.84* 3.00* 3.89* 2.97*  ALBUMIN 2.2*  --  2.4* 2.4* 2.4* 2.4* 2.5* 2.3*  --   --   CALCIUM 5.9*  < > 7.0* 7.0* 6.8* 7.0* 7.9* 8.1* 8.5* 8.6*  PHOS 2.5  --   --  1.1* 1.4* 1.7* 3.0 3.2  --   --   AST  --   --  19  --   --   --   --   --   --   --   ALT  --   --  12*  --   --   --   --   --   --   --   < > = values in this interval not displayed. Liver Function Tests:  Recent Labs Lab 03/22/17 0142  03/23/17 0459 03/24/17 1000 03/25/17 0231  AST 19  --   --   --   --   ALT 12*  --   --   --   --   ALKPHOS 71  --   --   --   --   BILITOT 0.7  --   --   --   --   PROT 4.6*  --   --   --   --   ALBUMIN 2.4*  < > 2.4* 2.5* 2.3*  < > =  values in this interval not displayed. No results for input(s): LIPASE, AMYLASE in the last 168 hours. No results for input(s): AMMONIA in the last 168 hours. CBC:  Recent Labs Lab 03/22/17 1255 03/23/17 0459 03/24/17 1000 03/26/17 0513 03/27/17 0340  WBC 7.3 9.4 8.9 9.3 9.2  HGB 6.5* 8.6*  8.7* 8.9* 8.4*  HCT 20.2* 26.7* 27.6* 29.1* 27.4*  MCV 94.4 94.3 95.5 98.6 100.0  PLT 122* 103* PLATELET CLUMPS NOTED ON SMEAR, COUNT APPEARS DECREASED 121* 102*   Cardiac Enzymes: No results for input(s): CKTOTAL, CKMB, CKMBINDEX, TROPONINI in the last 168 hours. CBG:  Recent Labs Lab 03/26/17 1324 03/26/17 1708 03/26/17 2058 03/27/17 0805 03/27/17 1222  GLUCAP 83 137* 105* 71 127*    Iron Studies: No results for input(s): IRON, TIBC, TRANSFERRIN, FERRITIN in the last 72 hours. Studies/Results: No results found. . calcitRIOL  2 mcg Oral Daily  . calcium carbonate  2 tablet Oral BID WC  . cyanocobalamin  1,000 mcg Intramuscular Q8 Weeks  . darbepoetin (ARANESP) injection - DIALYSIS  200 mcg Intravenous Q Sat-HD  . feeding supplement (PRO-STAT SUGAR FREE 64)  30 mL Oral BID  . fluticasone furoate-vilanterol  1 puff Inhalation Daily  . levothyroxine  125 mcg Oral QAC breakfast  . multivitamin  1 tablet Oral QHS  . pantoprazole  40 mg Oral Daily  . potassium chloride  40 mEq Oral Daily  . predniSONE  5 mg Oral Q breakfast  . umeclidinium bromide  1 puff Inhalation Daily    BMET    Component Value Date/Time   NA 138 03/27/2017 0340   NA 142 07/30/2016 1156   K 4.3 03/27/2017 0340   CL 111 03/27/2017 0340   CO2 21 (L) 03/27/2017 0340   GLUCOSE 73 03/27/2017 0340   BUN 9 03/27/2017 0340   BUN 31 (H) 07/30/2016 1156   CREATININE 2.97 (H) 03/27/2017 0340   CALCIUM 8.6 (L) 03/27/2017 0340   CALCIUM 5.9 (LL) 03/29/2014 2304   GFRNONAA 15 (L) 03/27/2017 0340   GFRAA 17 (L) 03/27/2017 0340   CBC    Component Value Date/Time   WBC 9.2 03/27/2017 0340   RBC 2.74 (L)  03/27/2017 0340   HGB 8.4 (L) 03/27/2017 0340   HCT 27.4 (L) 03/27/2017 0340   PLT 102 (L) 03/27/2017 0340   MCV 100.0 03/27/2017 0340   MCH 30.7 03/27/2017 0340   MCHC 30.7 03/27/2017 0340   RDW 17.5 (H) 03/27/2017 0340   LYMPHSABS 2.7 07/15/2016 0549   MONOABS 0.7 07/15/2016 0549   EOSABS 0.2 07/15/2016 0549   BASOSABS 0.0 07/15/2016 0549     Assessment/Plan:  1. ESRD -  TTS schedule SNF placement.  CLIP'd to [facility].  She will need to f/u with [facility] after discharge for stage 2 of brachial AVF.  2. Anemia of CKD - on aranesp, hgb improving 3. Mineral metabolic disorder - on vitamin D, calcium, calcium improving  4. Hypokalemia 2/2 diarrhea- Kdur 5. Refeeding syndrome - phos improved with regular diet  6. Hypomagnesemia- repleting 7. Crohns disease - per primary  8. Weakness- expresses concern for being able to get around in her home, SNF placement being arranged   Madelon Lips MD [place] pgr [phone]

## 2017-04-07 ENCOUNTER — Encounter: Payer: Self-pay | Admitting: Emergency Medicine

## 2017-04-07 ENCOUNTER — Ambulatory Visit: Payer: Medicare Other | Admitting: Emergency Medicine

## 2017-04-07 DIAGNOSIS — R0600 Dyspnea, unspecified: Secondary | ICD-10-CM

## 2017-04-07 NOTE — Assessment & Plan Note (Signed)
This was clearly associated with her arrival at and initiation of dialysis.  There was no evidence of bronchospasm based on her description.  They did put her empirically on oxygen although she does not think that they documented desaturation.  It was early in her dialysis, they did slow the dialysis down temporarily.  Unclear that volume shifts could have been relevant so early in the treatment but note that she did arrive below her target weight of 43 kg and she has been having recurrent diarrhea that could contribute to hypovolemia.  She wonders if this may have been a panic attack and this certainly is a consideration.  She is not wheezing on exam today and did not have any other symptoms such as chest pain or cough.  I believe it would be reasonable for her to go to dialysis tomorrow as planned and if she experiences another similar episode she will need to talk to Dr. Florene Glen regarding the treatments, her volume status, her appropriate dry weight.  I will forward this note to Dr. Florene Glen for his review.

## 2017-04-07 NOTE — Progress Notes (Signed)
Subjective:    Patient ID: Natasha [name], female    DOB: 05/22/1946, 71 y.o.   MRN: [id number]  HPI 71 year old former smoker (30 pack years) with a history of Crohn's disease, end-stage renal disease (only recently started HD), hypertension, diastolic CHF, followed in our office by Dr. Halford Chessman for COPD/emphysema.  She is here today for an acute visit.  Reports that she was hospitalized through 03/27/17 for "feeling awful". She was felt to be having a possible Crohn' s flare, but also had to start HD during that hospitalization. Course was complicated by a seizure. She was sent to rehab and is now home, is getting outpt HD. She reports that yesterday at HD (her 4th treatment) she had an acute decompensation as HD was starting > felt that she was severely SOB, was going to pass out. She was placed on O2, started to feel better after about 30 minutes. No documented desaturation. She did not wheeze, cough, have any CP. Her wt at the time was 42 kg. Her wt today is 42.6 kg, target wt is 43 kg.  The symptoms have currently resolved.  She is still having chronic diarrhea, under evaluation by Dr Michail Sermon.    Review of Systems  Past Medical History:  Diagnosis Date  . Anal fistula   . Anemia in chronic kidney disease (CKD)   . Aortic insufficiency    Echo 3/18: mod conc LVH, EF 60-65, no RWMA, Gr 1 DD, mod AI, mild MR, normal RVSF, Trivial TR  . Arthritis   . Borderline hypertension   . Bulging disc    L3-L4  . Chronic diarrhea    due to crohn's  . CKD (chronic kidney disease), stage IV ([place])    NEPHROLOGIST--  DR MATTINGLY  . Crohn's disease ([place])    chronic ileitis  . Dyspnea    with exertion  . Emphysema/COPD ([place])   . GERD (gastroesophageal reflux disease)    denies  . History of glaucoma   . History of kidney stones   . History of pancreatitis    2008--  mercaptopurine  . History of small bowel obstruction    12-03-2010  due to crohn's ileitis  . Hypertension    no longer on  medications  . Hypothyroidism, postsurgical    multinodule w/ hurthle cells  . Nephrolithiasis    bilateral  . Osteoporosis   . Perianal Crohn's disease ([place])   . Peripheral vascular disease ([place])    blood clot behind knee left leg  . Polyarthralgia   . Wears partial dentures    upper     Family History  Problem Relation Age of Onset  . Hypertension Father   . Heart disease Father   . Heart attack Father   . Diverticulitis Mother   . COPD Mother   . Hypertension Mother   . Heart disease Mother   . Heart attack Mother   . Heart attack Brother   . Colon cancer Brother   . Diabetes Brother   . Thyroid disease Brother      Social History   Socioeconomic History  . Marital status: Married    Spouse name: [name]  . Number of children: 3  . Years of education: 5  . Highest education level: Not on file  Social Needs  . Financial resource strain: Not on file  . Food insecurity - worry: Not on file  . Food insecurity - inability: Not on file  . Transportation needs - medical: Not on  file  . Transportation needs - non-medical: Not on file  Occupational History  . Occupation: Retired    Fish farm manager: RETIRED  Tobacco Use  . Smoking status: Former Smoker    Packs/day: 0.25    Years: 15.00    Pack years: 3.75    Types: Cigarettes    Last attempt to quit: 11/27/2001    Years since quitting: 15.3  . Smokeless tobacco: Never Used  Substance and Sexual Activity  . Alcohol use: No  . Drug use: No  . Sexual activity: No  Other Topics Concern  . Not on file  Social History Narrative   Patient is married [name]) and lives at home with her husband.   Patient has three children.   Patient is retired on disability.   Patient drinks two cups of caffeine daily.   Patient is right-handed.           Allergies  Allergen Reactions  . Mercaptopurine Other (See Comments)    Caused pancreatitis  . Remicade [Infliximab] Other (See Comments)    CAUSED JOINT PAIN     Outpatient  Medications Prior to Visit  Medication Sig Dispense Refill  . acetaminophen (TYLENOL) 325 MG tablet Take 325 mg by mouth every 6 (six) hours as needed for mild pain.    . Adalimumab (HUMIRA) 40 MG/0.8ML PSKT Inject 40 mg into the skin every 14 (fourteen) days.    [name] albuterol (PROVENTIL HFA;VENTOLIN HFA) 108 (90 BASE) MCG/ACT inhaler Inhale 2 puffs into the lungs every 6 (six) hours as needed for wheezing or shortness of breath.    [name] alendronate (FOSAMAX) 70 MG tablet Take 70 mg by mouth once a week. Mondays  1  . calcitRIOL (ROCALTROL) 0.25 MCG capsule Take 0.5 mcg by mouth daily.    . calcium-vitamin D (OSCAL WITH D) 500-200 MG-UNIT tablet Take 1 tablet by mouth 2 (two) times daily.    . Cholecalciferol (VITAMIN D3) 2000 UNITS TABS Take 2,000 Units by mouth 2 (two) times daily.     . cyanocobalamin (,VITAMIN B-12,) 1000 MCG/ML injection Inject 1,000 mcg into the muscle See admin instructions. Every 60 days    . Fluticasone-Umeclidin-Vilant (TRELEGY ELLIPTA) 100-62.5-25 MCG/INH AEPB Inhale 1 puff into the lungs daily. 28 each 5  . folic acid (FOLVITE) 1 MG tablet Take 1 mg by mouth daily.    [name] levothyroxine (SYNTHROID, LEVOTHROID) 125 MCG tablet Take 125 mcg by mouth daily before breakfast.     . loperamide (IMODIUM) 2 MG capsule Take 2 capsules (4 mg total) by mouth as needed for diarrhea or loose stools. 30 capsule 0  . potassium chloride SA (K-DUR,KLOR-CON) 20 MEQ tablet Take 40 mEq by mouth 2 (two) times daily with a meal.   0  . predniSONE (DELTASONE) 5 MG tablet Take 5 mg by mouth daily with breakfast.    . promethazine (PHENERGAN) 25 MG tablet Take 25 mg by mouth every 6 (six) hours as needed for nausea or vomiting.     . sodium bicarbonate 650 MG tablet Take 2 tablets (1,300 mg total) by mouth 2 (two) times daily. (Patient taking differently: Take 1,950 mg by mouth 2 (two) times daily. ) 120 tablet 0  . traMADol (ULTRAM) 50 MG tablet Take 1-2 tablets (50-100 mg total) by mouth every 6 (six)  hours as needed for severe pain. 6 tablet 0  . nitroGLYCERIN (NITROSTAT) 0.4 MG SL tablet Place 1 tablet (0.4 mg total) under the tongue every 5 (five) minutes as needed for chest  pain. 25 tablet 3   No facility-administered medications prior to visit.          Objective:   Physical Exam Vitals:   04/07/17 1611 04/07/17 1616  BP:  102/62  Pulse:  (!) 110  SpO2:  100%  Weight: 94 lb (42.6 kg)   Height: 5' 4"  (1.626 m)    Gen: Pleasant, well-nourished, in no distress,  normal affect  ENT: No lesions,  mouth clear,  oropharynx clear, no postnasal drip  Neck: No JVD, no TMG, no carotid bruits  Lungs: No use of accessory muscles, clear without rales or rhonchi  Cardiovascular: RRR, heart sounds normal, no murmur or gallops, no peripheral edema  Musculoskeletal: No deformities, no cyanosis or clubbing  Neuro: alert, non focal  Skin: Warm, no lesions or rash     Assessment & Plan:  Acute dyspnea This was clearly associated with her arrival at and initiation of dialysis.  There was no evidence of bronchospasm based on her description.  They did put her empirically on oxygen although she does not think that they documented desaturation.  It was early in her dialysis, they did slow the dialysis down temporarily.  Unclear that volume shifts could have been relevant so early in the treatment but note that she did arrive below her target weight of 43 kg and she has been having recurrent diarrhea that could contribute to hypovolemia.  She wonders if this may have been a panic attack and this certainly is a consideration.  She is not wheezing on exam today and did not have any other symptoms such as chest pain or cough.  I believe it would be reasonable for her to go to dialysis tomorrow as planned and if she experiences another similar episode she will need to talk to Dr. Florene Glen regarding the treatments, her volume status, her appropriate dry weight.  I will forward this note to Dr. Florene Glen  for his review.   Baltazar Apo, MD, PhD 04/07/2017, 5:16 PM [facility] [phone] or if no answer [phone]

## 2017-04-07 NOTE — Patient Instructions (Addendum)
If you have recurrent similar symptoms when you go for your dialysis treatment then we will need to discuss your target weight, target fluid status with Dr. Florene Glen.  Please continue your inhaled medications as you have been taking them Follow with Dr Halford Chessman in 1-2 months

## 2017-04-27 ENCOUNTER — Telehealth: Payer: Self-pay | Admitting: Pulmonary Disease

## 2017-04-27 NOTE — Telephone Encounter (Signed)
She needs an ROV.

## 2017-04-27 NOTE — Telephone Encounter (Signed)
Spoke with the pt and notified of recs per VS  Appt was scheduled for 04/29/17

## 2017-04-27 NOTE — Telephone Encounter (Signed)
Called spoke with patient who reports that when she saw RB for acute visit on 11.13.18 for increased SOB, it was discussed that she may have been experiencing a panic attack.  Patient now states that she has a panic attack with increased SOB and severe tightness in her chest (feels like "her chest is closing up and she cannot get any air in") every time she has dialysis on M/W/F.  Seems to be primarily associated with the anticipation of and reclining during dialysis.  Pt stated that when she can sit up she begins to feel better, but it still takes 20 minutes to feel improvement.  Dialysis has applied 2lpm O2 to assist with symptoms.  Pt stated that the MD at the dialysis center recommended she call her pulmonologist.  Dr Halford Chessman please advise,thank you. Pt has dialysis today at 1220  Per 11.13.18 ov w/ RB: Acute dyspnea - Collene Gobble, MD at 04/07/2017  5:13 PM  Status: Written  Related Problem: Acute dyspnea    This was clearly associated with her arrival at and initiation of dialysis.  There was no evidence of bronchospasm based on her description.  They did put her empirically on oxygen although she does not think that they documented desaturation.  It was early in her dialysis, they did slow the dialysis down temporarily.  Unclear that volume shifts could have been relevant so early in the treatment but note that she did arrive below her target weight of 43 kg and she has been having recurrent diarrhea that could contribute to hypovolemia.  She wonders if this may have been a panic attack and this certainly is a consideration.  She is not wheezing on exam today and did not have any other symptoms such as chest pain or cough.  I believe it would be reasonable for her to go to dialysis tomorrow as planned and if she experiences another similar episode she will need to talk to Dr. Florene Glen regarding the treatments, her volume status, her appropriate dry weight.  I will forward this note to Dr. Florene Glen for  his review.

## 2017-04-28 ENCOUNTER — Ambulatory Visit
Admission: RE | Admit: 2017-04-28 | Discharge: 2017-04-28 | Disposition: A | Discharge status: Home / Self Care | Payer: Medicare Other | Source: Ambulatory Visit | Attending: Nephrology | Admitting: Nephrology

## 2017-04-28 ENCOUNTER — Other Ambulatory Visit: Payer: Self-pay | Admitting: Nephrology

## 2017-04-28 DIAGNOSIS — R0602 Shortness of breath: Secondary | ICD-10-CM

## 2017-04-29 ENCOUNTER — Encounter: Payer: Self-pay | Admitting: Pulmonary Disease

## 2017-04-29 ENCOUNTER — Ambulatory Visit (INDEPENDENT_AMBULATORY_CARE_PROVIDER_SITE_OTHER): Payer: Medicare Other | Admitting: Pulmonary Disease

## 2017-04-29 VITALS — BP 108/58 | HR 107 | Ht 64.0 in | Wt 97.0 lb

## 2017-04-29 DIAGNOSIS — R29818 Other symptoms and signs involving the nervous system: Secondary | ICD-10-CM

## 2017-04-29 DIAGNOSIS — J432 Centrilobular emphysema: Secondary | ICD-10-CM | POA: Diagnosis not present

## 2017-04-29 NOTE — Progress Notes (Signed)
Current Outpatient Medications on File Prior to Visit  Medication Sig  . acetaminophen (TYLENOL) 325 MG tablet Take 325 mg by mouth every 6 (six) hours as needed for mild pain.  . Adalimumab (HUMIRA) 40 MG/0.8ML PSKT Inject 40 mg into the skin every 14 (fourteen) days.  [name] albuterol (PROVENTIL HFA;VENTOLIN HFA) 108 (90 BASE) MCG/ACT inhaler Inhale 2 puffs into the lungs every 6 (six) hours as needed for wheezing or shortness of breath.  [name] alendronate (FOSAMAX) 70 MG tablet Take 70 mg by mouth once a week. Mondays  . calcitRIOL (ROCALTROL) 0.25 MCG capsule Take 0.5 mcg by mouth daily.  . Cholecalciferol (VITAMIN D3) 2000 UNITS TABS Take 2,000 Units by mouth 2 (two) times daily.   . cyanocobalamin (,VITAMIN B-12,) 1000 MCG/ML injection Inject 1,000 mcg into the muscle See admin instructions. Every 60 days  . Fluticasone-Umeclidin-Vilant (TRELEGY ELLIPTA) 100-62.5-25 MCG/INH AEPB Inhale 1 puff into the lungs daily.  [name] levothyroxine (SYNTHROID, LEVOTHROID) 125 MCG tablet Take 125 mcg by mouth daily before breakfast.   . loperamide (IMODIUM) 2 MG capsule Take 2 capsules (4 mg total) by mouth as needed for diarrhea or loose stools.  . predniSONE (DELTASONE) 5 MG tablet Take 5 mg by mouth daily with breakfast.  . sodium bicarbonate 650 MG tablet Take 2 tablets (1,300 mg total) by mouth 2 (two) times daily. (Patient taking differently: Take 1,950 mg by mouth 2 (two) times daily. )  . traMADol (ULTRAM) 50 MG tablet Take 1-2 tablets (50-100 mg total) by mouth every 6 (six) hours as needed for severe pain.  . nitroGLYCERIN (NITROSTAT) 0.4 MG SL tablet Place 1 tablet (0.4 mg total) under the tongue every 5 (five) minutes as needed for chest pain.   No current facility-administered medications on file prior to visit.     Chief Complaint  Patient presents with  . 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 female former smoker with COPD/emphysema.  She was advised to f/u with pulmonary due to dyspnea during dialysis.  She reports that she needs to lay flat on her back during dialysis.  She was told this would make the dialysis session work better.  When she lays on her back, she feels her throat is closing and then she can't breath.  She gets panicked.  As soon as she sits up she feels like her breathing gets better.  She doesn't get wheeze or chest congestion.  She hasn't had chest pain or palpitations.  She was told while in the hospital that she had snoring and trouble breathing and her oxygen while asleep.  She is tired frequently during the day.  She had chest xray on 04/28/17 that was unremarkable.  Physical exam  General - pleasant Eyes - pupils reactive ENT - no sinus tenderness, no oral exudate, no LAN, MP 4, retropharyngeal collapse when laying flat Cardiac - regular, no murmur Chest - no wheeze, rales Abd - soft, non tender Ext - no edema Skin - no rashes Neuro - normal strength Psych - normal mood  Dg Chest 2 View  Result  Date: 04/28/2017 CLINICAL DATA:  71 year old female on dialysis with increasing shortness breath. Initial encounter. EXAM: CHEST  2 VIEW COMPARISON:  04/14/2016 chest x-ray. FINDINGS: Right central line tip distal superior vena caval/ cavoatrial junction level. No pneumothorax. Chronic lung changes.  No infiltrate or consolidation. Central pulmonary vascular prominence without pulmonary edema. No plain film evidence of pulmonary malignancy. Postsurgical changes thyroid region. Heart size top-normal. Minimally calcified slightly tortuous aorta. Mild scoliosis.  No acute osseous abnormality. IMPRESSION: No active cardiopulmonary disease. Right central line tip distal superior vena cava/ cavoatrial junction level. Heart size top-normal. Aortic Atherosclerosis (ICD10-I70.0). Electronically Signed   By: Genia Del M.D.   On: 04/28/2017 19:50     Discussion She has symptoms of dyspnea and panic when laying flat.  I think this is related to upper airway closure in the supine position.  She also has symptoms suggestive of sleep apnea.  Assessment/plan  Dyspnea while in supine position. - advised her to d/w nephrology about whether she could complete dialysis session while in the upright position  Suspected sleep apnea. - will arrange for home sleep study  Severe COPD with emphysema. - don't think this is causing her current symptoms - continue trelegy and prn albuterol  Crohn's colitis. - she is followed at [facility] - she is on prednisone and humira   Stage IV CKD. - she is followed by Dr. Florene Glen and Dr. Jimmy Footman   Patient Instructions  Will arrange for home sleep study   Follow up in 3 months    Chesley Mires, MD [facility] Pager:  [phone] 04/29/2017, 1:48 PM

## 2017-04-29 NOTE — Patient Instructions (Signed)
Will arrange for home sleep study   Follow up in 3 months

## 2017-05-04 ENCOUNTER — Other Ambulatory Visit: Payer: Self-pay

## 2017-05-04 ENCOUNTER — Emergency Department (HOSPITAL_COMMUNITY)
Admission: EM | Admit: 2017-05-04 | Discharge: 2017-05-04 | Disposition: A | Discharge status: Home / Self Care | Payer: Medicare Other | Attending: Emergency Medicine | Admitting: Emergency Medicine

## 2017-05-04 ENCOUNTER — Encounter (HOSPITAL_COMMUNITY): Payer: Self-pay

## 2017-05-04 ENCOUNTER — Emergency Department (HOSPITAL_COMMUNITY): Payer: Medicare Other

## 2017-05-04 DIAGNOSIS — D631 Anemia in chronic kidney disease: Secondary | ICD-10-CM | POA: Insufficient documentation

## 2017-05-04 DIAGNOSIS — I130 Hypertensive heart and chronic kidney disease with heart failure and stage 1 through stage 4 chronic kidney disease, or unspecified chronic kidney disease: Secondary | ICD-10-CM | POA: Diagnosis not present

## 2017-05-04 DIAGNOSIS — R0600 Dyspnea, unspecified: Secondary | ICD-10-CM | POA: Insufficient documentation

## 2017-05-04 DIAGNOSIS — Z992 Dependence on renal dialysis: Secondary | ICD-10-CM | POA: Insufficient documentation

## 2017-05-04 DIAGNOSIS — N184 Chronic kidney disease, stage 4 (severe): Secondary | ICD-10-CM | POA: Insufficient documentation

## 2017-05-04 DIAGNOSIS — J449 Chronic obstructive pulmonary disease, unspecified: Secondary | ICD-10-CM | POA: Insufficient documentation

## 2017-05-04 DIAGNOSIS — E039 Hypothyroidism, unspecified: Secondary | ICD-10-CM | POA: Diagnosis not present

## 2017-05-04 DIAGNOSIS — I5032 Chronic diastolic (congestive) heart failure: Secondary | ICD-10-CM | POA: Diagnosis not present

## 2017-05-04 DIAGNOSIS — Z79899 Other long term (current) drug therapy: Secondary | ICD-10-CM | POA: Insufficient documentation

## 2017-05-04 DIAGNOSIS — Z87891 Personal history of nicotine dependence: Secondary | ICD-10-CM | POA: Insufficient documentation

## 2017-05-04 DIAGNOSIS — K5090 Crohn's disease, unspecified, without complications: Secondary | ICD-10-CM | POA: Diagnosis not present

## 2017-05-04 NOTE — ED Provider Notes (Signed)
[facility] EMERGENCY DEPARTMENT Provider Note   CSN: [id number] Arrival date & time: 05/04/17  1526     History   Chief Complaint Chief Complaint  Patient presents with  . Shortness of Breath    HPI Natasha [name] is a 71 y.o. female.  HPI   72yF with dyspnea. Onset while at dialysis. Has been a reoccuring issue for her. Recently started HD. She reports she had four session while in hospital sitting upright and tolerated well. She says they insist on laying her supine at her HD center and she just doesn't tolerate it. She feels like her throat is closing and she cannot breath. She says she was talking and joking with other patients while waiting for her treatment today and felt well. She has been out shopping and other similar activities w/o symptoms. She only seems to be symptomatic during HD which resolved when sitting up. Denies pain or palpitations.   Past Medical History:  Diagnosis Date  . Anal fistula   . Anemia in chronic kidney disease (CKD)   . Aortic insufficiency    Echo 3/18: mod conc LVH, EF 60-65, no RWMA, Gr 1 DD, mod AI, mild MR, normal RVSF, Trivial TR  . Arthritis   . Borderline hypertension   . Bulging disc    L3-L4  . Chronic diarrhea    due to crohn's  . CKD (chronic kidney disease), stage IV ([place])    NEPHROLOGIST--  DR MATTINGLY  . Crohn's disease ([place])    chronic ileitis  . Dyspnea    with exertion  . Emphysema/COPD ([place])   . GERD (gastroesophageal reflux disease)    denies  . History of glaucoma   . History of kidney stones   . History of pancreatitis    2008--  mercaptopurine  . History of small bowel obstruction    12-03-2010  due to crohn's ileitis  . Hypertension    no longer on medications  . Hypothyroidism, postsurgical    multinodule w/ hurthle cells  . Nephrolithiasis    bilateral  . Osteoporosis   . Perianal Crohn's disease ([place])   . Peripheral vascular disease ([place])    blood clot behind knee left leg    . Polyarthralgia   . Wears partial dentures    upper    Patient Active Problem List   Diagnosis Date Noted  . Crohn's disease of colon with complication ([place])   . Aortic insufficiency 07/30/2016  . Atherosclerosis of aorta ([place]) 07/30/2016  . Pressure injury of skin 07/14/2016  . Gastroenteritis, acute 07/12/2016  . COPD (chronic obstructive pulmonary disease) ([place]) 10/12/2015  . Malnutrition of moderate degree ([place]) 09/13/2014  . history of Left leg DVT 09/10/2014  . Hypotension 09/10/2014  . Hypoglycemia 09/10/2014  . Ischiorectal abscess 09/09/2014  . Increased anion gap metabolic acidosis   . Generalized abdominal pain   . Chronic diastolic CHF (congestive heart failure) ([place])   . Hypertensive heart disease   . Acute on chronic renal failure ([place])   . Secondary hyperparathyroidism ([place])   . Other specified hypothyroidism   . Right shoulder pain   . Thrombocytopenia ([place])   . Severe protein-calorie malnutrition ([place])   . Thyroid activity decreased   . Emphysema of lung ([place])   . Nephrolithiasis 03/29/2014  . AKI (acute kidney injury) ([place]) 03/29/2014  . Spinal stenosis of lumbar region 11/01/2013  . Bilateral leg pain 09/28/2013  . Emphysema/COPD ([place])   . Acute dyspnea 09/20/2013  .  Protein-calorie malnutrition, severe ([place]) 05/14/2013  . Salmonella enteritidis 06/11/2012  . Hypocalcemia 06/10/2012  . Hypothyroidism 06/10/2012  . Anemia of chronic disease 06/09/2012  . Hypomagnesemia 06/09/2012  . CKD (chronic kidney disease) stage 4, GFR 15-29 ml/min (H[facility]) 06/09/2012  . Acute renal failure ([place]) 06/07/2012  . Dehydration 06/07/2012  . Crohn's disease without complication ([place]) 55/73/2202  . Metabolic acidosis 54/27/0623  . Hypokalemia 06/07/2012  . Nausea vomiting and diarrhea 06/07/2012  . UTI (lower urinary tract infection) 06/07/2012    Past Surgical History:  Procedure Laterality Date  . ABDOMINAL HYSTERECTOMY  1990   and  Appendectomy  . BASCILIC  VEIN TRANSPOSITION Left 01/09/2017   Procedure: LEFT 1ST STAGE BRACHIAL VEIN TRANSPOSITION;  Surgeon: Conrad [place], MD;  Location: [facility];  Service: Vascular;  Laterality: Left;  . COLON RESECTION  x3 --  1978,  1987, [place] x2/   [place]  . COLONOSCOPY WITH PROPOFOL N/A 09/25/2014   Procedure: COLONOSCOPY WITH PROPOFOL;  Surgeon: Garlan Fair, MD;  Location: [facility];  Service: Endoscopy;  Laterality: N/A;  . CYSTOSCOPY W/ URETERAL STENT PLACEMENT Right 11/19/2016   Procedure: CYSTOSCOPY WITH RIGHT RETROGRADE PYELOGRAM/ URETEROSCOPY WITH LASER AND RIGHT URETERAL STENT PLACEMENT;  Surgeon: Ardis Hughs, MD;  Location: [facility];  Service: Urology;  Laterality: Right;  . ESOPHAGOGASTRODUODENOSCOPY  03/04/2012   Procedure: ESOPHAGOGASTRODUODENOSCOPY (EGD);  Surgeon: Garlan Fair, MD;  Location: [facility];  Service: Endoscopy;  Laterality: N/A;  . EXAMINATION UNDER ANESTHESIA N/A 09/12/2014   Procedure: EXAM UNDER ANESTHESIA;  Surgeon: Rolm Bookbinder, MD;  Location: [facility];  Service: General;  Laterality: N/A;  . FLEXIBLE SIGMOIDOSCOPY  03/04/2012   Procedure: FLEXIBLE SIGMOIDOSCOPY;  Surgeon: Garlan Fair, MD;  Location: [facility];  Service: Endoscopy;  Laterality: N/A;  . GLAUCOMA SURGERY Bilateral   . HOLMIUM LASER APPLICATION Right 7/62/8315   Procedure: HOLMIUM LASER APPLICATION;  Surgeon: Ardis Hughs, MD;  Location: [facility];  Service: Urology;  Laterality: Right;  . INCISION AND DRAINAGE PERIRECTAL ABSCESS N/A 09/12/2014   Procedure: IRRIGATION AND DEBRIDEMENT PERIRECTAL ABSCESS;  Surgeon: Rolm Bookbinder, MD;  Location: [facility];  Service: General;  Laterality: N/A;  . IR FLUORO GUIDE CV LINE RIGHT  03/20/2017  . IR US GUIDE VASC ACCESS RIGHT  03/20/2017  . NEGATIVE SLEEP STUDY  2008  . PLACEMENT OF SETON N/A 12/01/2014   Procedure: PLACEMENT OF SETON;  Surgeon: [name], MD;  Location: [facility];  Service:  General;  Laterality: N/A;  . RECTAL EXAM UNDER ANESTHESIA N/A 12/01/2014   Procedure: RECTAL EXAM UNDER ANESTHESIA;  Surgeon: [name], MD;  Location: [facility];  Service: General;  Laterality: N/A;  . TOTAL THYROIDECTOMY  10-13-2003  . TRANSTHORACIC ECHOCARDIOGRAM  07-11-2013   mild LVH,  ef 55%,  moderate AR,  mild MR and TR,  trivial PR    OB History    No data available       Home Medications    Prior to Admission medications   Medication Sig Start Date End Date Taking? Authorizing Provider  acetaminophen (TYLENOL) 325 MG tablet Take 325 mg by mouth every 6 (six) hours as needed for mild pain.   Yes [provider]  Adalimumab (HUMIRA) 40 MG/0.8ML PSKT Inject 40 mg into the skin every 14 (fourteen) days.   Yes [provider]  albuterol (PROVENTIL HFA;VENTOLIN HFA) 108 (90 BASE) MCG/ACT inhaler Inhale 2 puffs into the lungs  every 6 (six) hours as needed for wheezing or shortness of breath.   Yes [provider]  alendronate (FOSAMAX) 70 MG tablet Take 70 mg by mouth every Monday.  11/06/16  Yes [provider]  calcitRIOL (ROCALTROL) 0.25 MCG capsule Take 0.25 mcg by mouth every Monday, Wednesday, and Friday with hemodialysis. 02/03/17  Yes [provider]  cyanocobalamin (,VITAMIN B-12,) 1000 MCG/ML injection Inject 1,000 mcg into the muscle every 3 (three) months.    Yes [provider]  Fluticasone-Umeclidin-Vilant (TRELEGY ELLIPTA) 100-62.5-25 MCG/INH AEPB Inhale 1 puff into the lungs daily. 02/27/17  Yes Chesley Mires, MD  levothyroxine (SYNTHROID, LEVOTHROID) 125 MCG tablet Take 125 mcg by mouth daily before breakfast.    Yes [provider]  loperamide (IMODIUM) 2 MG capsule Take 2 capsules (4 mg total) by mouth as needed for diarrhea or loose stools. 03/27/17  Yes Gherghe, Vella Redhead, MD  midodrine (PROAMATINE) 5 MG tablet Take 5 mg by mouth See admin instructions. 5 mg PRIOR TO dialysis on Mon/Wed/Fri  04/30/17  Yes [provider]  nitroGLYCERIN (NITROSTAT) 0.4 MG SL tablet Place 1 tablet (0.4 mg total) under the tongue every 5 (five) minutes as needed for chest pain. 08/19/16 05/04/17 Yes Weaver, Scott T, PA-C  predniSONE (DELTASONE) 5 MG tablet Take 5 mg by mouth daily with breakfast.   Yes [provider]  sodium bicarbonate 650 MG tablet Take 2 tablets (1,300 mg total) by mouth 2 (two) times daily. Patient taking differently: Take 1,300 mg by mouth daily.  11/21/16  Yes Mariel Aloe, MD  traMADol (ULTRAM) 50 MG tablet Take 1-2 tablets (50-100 mg total) by mouth every 6 (six) hours as needed for severe pain. 01/09/17  Yes Ulyses Amor, PA-C    Family History Family History  Problem Relation Age of Onset  . Hypertension Father   . Heart disease Father   . Heart attack Father   . Diverticulitis Mother   . COPD Mother   . Hypertension Mother   . Heart disease Mother   . Heart attack Mother   . Heart attack Brother   . Colon cancer Brother   . Diabetes Brother   . Thyroid disease Brother     Social History Social History   Tobacco Use  . Smoking status: Former Smoker    Packs/day: 0.25    Years: 15.00    Pack years: 3.75    Types: Cigarettes    Last attempt to quit: 11/27/2001    Years since quitting: 15.4  . Smokeless tobacco: Never Used  Substance Use Topics  . Alcohol use: No  . Drug use: No     Allergies   Mercaptopurine and Remicade [infliximab]   Review of Systems Review of Systems  All systems reviewed and negative, other than as noted in HPI.  Physical Exam Updated Vital Signs BP 111/84   Pulse 100   Temp 98.6 F (37 C) (Oral)   Resp (!) 24   Ht 5' 4"  (1.626 m)   Wt 44 kg (97 lb)   SpO2 99%   BMI 16.65 kg/m   Physical Exam  Constitutional: She appears well-developed and well-nourished. No distress.  HENT:  Head: Normocephalic and atraumatic.  Eyes: Conjunctivae are normal. Right eye exhibits no discharge. Left eye  exhibits no discharge.  Neck: Neck supple.  Cardiovascular: Normal rate, regular rhythm and normal heart sounds. Exam reveals no gallop and no friction rub.  No murmur heard. Dialysis cath R subclavian  Pulmonary/Chest: Effort normal and breath sounds normal. No accessory muscle usage. No respiratory distress.  Abdominal: Soft. She exhibits no distension. There is no tenderness.  Musculoskeletal: She exhibits no edema or tenderness.       Right lower leg: She exhibits no edema.  Neurological: She is alert.  Skin: Skin is warm and dry.  Psychiatric: She has a normal mood and affect. Her behavior is normal. Thought content normal.  Nursing note and vitals reviewed.    ED Treatments / Results  Labs (all labs ordered are listed, but only abnormal results are displayed) Labs Reviewed - No data to display  EKG  EKG Interpretation  Date/Time:  Monday May 04 2017 15:27:13 EST Ventricular Rate:  95 PR Interval:    QRS Duration: 79 QT Interval:  382 QTC Calculation: 481 R Axis:   66 Text Interpretation:  Sinus rhythm Anterior infarct, old Confirmed by Virgel Manifold [phone]) on 05/04/2017 4:50:48 PM       Radiology No results found.  Procedures Procedures (including critical care time)  Medications Ordered in ED Medications - No data to display   Initial Impression / Assessment and Plan / ED Course  I have reviewed the triage vital signs and the nursing notes.  Pertinent labs & imaging results that were available during my care of the patient were reviewed by me and considered in my medical decision making (see chart for details).     71yM with dyspnea. Likely multifactorial in HD patient with underlying lung disease.   She is currently asymptomatic. O2 sats are normal on room air. She sounds clear.  She actually only seems to have symptoms when reclined for dialysis.  Recently seen by her pulmonologist for the same and think she may be having some degree of upper  airway closure in supine position. She may be developing some anticipatory anxiety with her sessions at this point as well. She has been put on supplemental o2 for her past couple session but I have the impression that this has been more for comfort and not actual desaturation. CXR today w/o acute abnormality. I don't think additional acute testing is needed at this time. Again advised to discuss further with her nephrologist/dialysis center.   Final Clinical Impressions(s) / ED Diagnoses   Final diagnoses:  Dyspnea, unspecified type    ED Discharge Orders    None       Virgel Manifold, MD 05/12/17 1028

## 2017-05-04 NOTE — ED Notes (Signed)
Patient transported to X-ray 

## 2017-05-04 NOTE — ED Notes (Signed)
Natasha [name] at bedside

## 2017-05-04 NOTE — ED Triage Notes (Signed)
Pt states she is unable to tolerate sitting back during dialysis due to shob. Treatment 1 hour of 4 scheduled. Pt has hx of similar problems but this time worse than before

## 2017-05-04 NOTE — ED Notes (Signed)
Pt states she undersgtands instructions. Home stable via wc with husband.,

## 2017-05-05 NOTE — Progress Notes (Signed)
    Postoperative Access Visit   History of Present Illness   SHEINA MCLEISH is a 71 y.o. year old female who presents for postoperative follow-up for: left first stage brachial vein transposition (Date: 01/09/17).  The patient's wounds are healed.  The patient notes no steal symptoms.  The patient is able to complete their activities of daily living.  The patient's current symptoms are: passing out due to hypotension during HD via [facility].   Physical Examination   Vitals:   05/08/17 0905  BP: 118/65  Resp: 14  Temp: (!) 97.4 F (36.3 C)  TempSrc: Oral  Weight: 95 lb (43.1 kg)  Height: 5' 4"  (1.626 m)   Body mass index is 16.31 kg/m.  left arm Incision is healed, skin feels warm, hand grip is 5/5, sensation in digits is intact, palpable thrill, bruit can be auscultated, on Sonosite: fistula 5- 5.5 mm moves from ant. To posterior to artery    Medical Decision Making   Deina Lipsey Worthey is a 71 y.o. year old female who presents s/p left first stage brachial vein transposition   I have concerns with this patient's ability to complete HD via L arm AVF given recurrent hypotension via TDC   Pt started on Midodrine recently.  Pt will follow up in 4 weeks to recheck maturation.  If flow issues better and fistula mature, will proceed with 2nd stage transposition in 4 weeks.  Thank you for allowing [place] to participate in this patient's care.   Adele Barthel, MD, FACS [facility] Office: [phone] Pager: [phone]

## 2017-05-08 ENCOUNTER — Other Ambulatory Visit: Payer: Self-pay

## 2017-05-08 ENCOUNTER — Encounter: Payer: Self-pay | Admitting: Vascular Surgery

## 2017-05-08 ENCOUNTER — Ambulatory Visit (INDEPENDENT_AMBULATORY_CARE_PROVIDER_SITE_OTHER): Payer: Medicare Other | Admitting: Vascular Surgery

## 2017-05-08 VITALS — BP 118/65 | Temp 97.4°F | Resp 14 | Ht 64.0 in | Wt 95.0 lb

## 2017-05-08 DIAGNOSIS — N186 End stage renal disease: Secondary | ICD-10-CM

## 2017-05-08 DIAGNOSIS — Z992 Dependence on renal dialysis: Secondary | ICD-10-CM

## 2017-06-09 ENCOUNTER — Ambulatory Visit: Payer: Medicare Other | Admitting: Pulmonary Disease

## 2017-06-09 ENCOUNTER — Encounter: Payer: Self-pay | Admitting: Pulmonary Disease

## 2017-06-09 VITALS — BP 100/70 | HR 99 | Ht 64.0 in | Wt 98.4 lb

## 2017-06-09 DIAGNOSIS — R29818 Other symptoms and signs involving the nervous system: Secondary | ICD-10-CM | POA: Diagnosis not present

## 2017-06-09 DIAGNOSIS — J432 Centrilobular emphysema: Secondary | ICD-10-CM

## 2017-06-09 MED ORDER — ALBUTEROL SULFATE HFA 108 (90 BASE) MCG/ACT IN AERS: 2.0000 | INHALATION_SPRAY | Freq: Four times a day (QID) | RESPIRATORY_TRACT | 5 refills | Status: DC | PRN

## 2017-06-09 NOTE — Progress Notes (Signed)
[facility]  Chief Complaint  Patient presents with  . Follow-up    Pt SOB rest and with exertion has increased in last month. When pt wakes up sometimes has harder time breathing, very SOB. Pt uses rescue inhaler only twice in last month with SOB    Vital signs: BP 100/70 (BP Location: Left Arm, Cuff Size: Normal)   Pulse 99   Ht 5' 4"  (1.626 m)   Wt 98 lb 6.4 oz (44.6 kg)   SpO2 97%   BMI 16.89 kg/m   History of Present Illness: Natasha [name] is a 71 y.o. female former smoker with COPD/Emphysema.  She is able to sit up more at HD.  This helps.  She will feel short of breath after HD is done, and have to use her inhaler.  She feels trelegy helps.  She gets winded with activity at home, but then recovers after resting for a few minutes.  She is not having cough, wheeze, sputum, or chest pain.  She has home sleep study scheduled for later this month.  Maintained SpO2 > 92% with ambulatory oximetry on room air today.  Physical Exam:  General - pleasant, thin Eyes - pupils reactive ENT - no sinus tenderness, no oral exudate, no LAN Cardiac - regular, no murmur Chest - no wheeze, rales Abd - soft, non tender Ext - no edema Skin - no rashes Neuro - normal strength Psych - normal mood  Assessment/Plan:  Severe COPD with emphysema. - continue trelegy and prn albuterol - explained that she needs to pace herself with activities   Suspected sleep apnea. - she has home sleep study scheduled for later this month  Crohn's colitis. - she is followed at [facility] - she is on prednisone and humira   Stage IV CKD. - she is followed by Dr. Florene Glen and Dr. Jimmy Footman   There are no Patient Instructions on file for this visit.   Chesley Mires, MD [facility] 06/09/2017, 11:28 AM Pager:  [phone]  Flow Sheet  Pulmonary tests: PFT 08/08/13 >> FEV1 1.00 (52%), FEV1% 50, TLC 3.97 (78%), DLCO 20% CT chest 04/17/16 >>  severe centrilobular emphysema  Cardiac tests Doppler b/l legs 04/06/14 >> DVT Lt femoral, popliteal, posterior tibial, peroneal veins Doppler Lt leg 09/11/14 >> chronic DVT Lt proximal and mid femoral veins, Lt popliteal veins Echo 08/07/16 >> EF 60 to 65%, grade 1 DD, mod AR, mild MR  Past Medical History: She  has a past medical history of Anal fistula, Anemia in chronic kidney disease (CKD), Aortic insufficiency, Arthritis, Borderline hypertension, Bulging disc, Chronic diarrhea, CKD (chronic kidney disease), stage IV ([place]), Crohn's disease ([place]), Dyspnea, Emphysema/COPD ([place]), GERD (gastroesophageal reflux disease), History of glaucoma, History of kidney stones, History of pancreatitis, History of small bowel obstruction, Hypertension, Hypothyroidism, postsurgical, Nephrolithiasis, Osteoporosis, Perianal Crohn's disease ([place]), Peripheral vascular disease ([place]), Polyarthralgia, and Wears partial dentures.  Past Surgical History: She  has a past surgical history that includes Colon resection (x3 --  1978,  1987, 1989); Esophagogastroduodenoscopy (03/04/2012); Flexible sigmoidoscopy (03/04/2012); Examination under anesthesia (N/A, 09/12/2014); Incision and drainage perirectal abscess (N/A, 09/12/2014); Colonoscopy with propofol (N/A, 09/25/2014); Total thyroidectomy (10-13-2003); transthoracic echocardiogram (07-11-2013); Glaucoma surgery (Bilateral); NEGATIVE SLEEP STUDY (2008); Abdominal hysterectomy (1990); Rectal exam under anesthesia (N/A, 12/01/2014); Placement of seton (N/A, 12/01/2014); Cystoscopy w/ ureteral stent placement (Right, 11/19/2016); Holmium laser application (Right, 3/53/2992); Bascilic vein transposition (Left, 01/09/2017); IR Fluoro Guide CV Line Right (03/20/2017); and IR US Guide  Vasc Access Right (03/20/2017).  Family History: Her family history includes COPD in her mother; Colon cancer in her brother; Diabetes in her brother; Diverticulitis in her mother; Heart attack in her brother,  father, and mother; Heart disease in her father and mother; Hypertension in her father and mother; Thyroid disease in her brother.  Social History: She  reports that she quit smoking about 15 years ago. Her smoking use included cigarettes. She has a 3.75 pack-year smoking history. she has never used smokeless tobacco. She reports that she does not drink alcohol or use drugs.  Medications: Allergies as of 06/09/2017      Reactions   Mercaptopurine Other (See Comments)   Caused pancreatitis   Remicade [infliximab] Other (See Comments)   CAUSED JOINT PAIN      Medication List        Accurate as of 06/09/17 11:28 AM. Always use your most recent med list.          acetaminophen 325 MG tablet Commonly known as:  TYLENOL Take 325 mg by mouth every 6 (six) hours as needed for mild pain.   albuterol 108 (90 Base) MCG/ACT inhaler Commonly known as:  PROVENTIL HFA;VENTOLIN HFA Inhale 2 puffs into the lungs every 6 (six) hours as needed for wheezing or shortness of breath.   alendronate 70 MG tablet Commonly known as:  FOSAMAX Take 70 mg by mouth every Monday.   calcitRIOL 0.25 MCG capsule Commonly known as:  ROCALTROL Take 0.25 mcg by mouth every Monday, Wednesday, and Friday with hemodialysis.   cyanocobalamin 1000 MCG/ML injection Commonly known as:  (VITAMIN B-12) Inject 1,000 mcg into the muscle every 3 (three) months.   Fluticasone-Umeclidin-Vilant 100-62.5-25 MCG/INH Aepb Commonly known as:  TRELEGY ELLIPTA Inhale 1 puff into the lungs daily.   HUMIRA 40 MG/0.8ML Pskt Generic drug:  Adalimumab Inject 40 mg into the skin every 14 (fourteen) days.   levothyroxine 125 MCG tablet Commonly known as:  SYNTHROID, LEVOTHROID Take 125 mcg by mouth daily before breakfast.   loperamide 2 MG capsule Commonly known as:  IMODIUM Take 2 capsules (4 mg total) by mouth as needed for diarrhea or loose stools.   midodrine 5 MG tablet Commonly known as:  PROAMATINE Take 5 mg by mouth  See admin instructions. 5 mg PRIOR TO dialysis on Mon/Wed/Fri   nitroGLYCERIN 0.4 MG SL tablet Commonly known as:  NITROSTAT Place 1 tablet (0.4 mg total) under the tongue every 5 (five) minutes as needed for chest pain.   predniSONE 5 MG tablet Commonly known as:  DELTASONE Take 5 mg by mouth daily with breakfast.   sodium bicarbonate 650 MG tablet Take 2 tablets (1,300 mg total) by mouth 2 (two) times daily.   traMADol 50 MG tablet Commonly known as:  ULTRAM Take 1-2 tablets (50-100 mg total) by mouth every 6 (six) hours as needed for severe pain.

## 2017-06-09 NOTE — Patient Instructions (Signed)
Will call with results of home sleep study  Follow up in 4 months

## 2017-06-11 NOTE — Progress Notes (Signed)
Postoperative Access Visit   History of Present Illness   Natasha [name] is a 72 y.o. (12-27-1945) female who presents for postoperative follow-up for: left first stage brachial vein transposition (Date: 01/09/17).  The patient's wounds are healed.  The patient notes no steal symptoms.  The patient is able to complete their activities of daily living.  The patient's current symptoms are: none.  Pt returns for evaluation for maturation of L 1st stage BrVT.  Past Medical History:  Diagnosis Date  . Anal fistula   . Anemia in chronic kidney disease (CKD)   . Aortic insufficiency    Echo 3/18: mod conc LVH, EF 60-65, no RWMA, Gr 1 DD, mod AI, mild MR, normal RVSF, Trivial TR  . Arthritis   . Borderline hypertension   . Bulging disc    L3-L4  . Chronic diarrhea    due to crohn's  . CKD (chronic kidney disease), stage IV ([place])    NEPHROLOGIST--  DR MATTINGLY  . Crohn's disease ([place])    chronic ileitis  . Dyspnea    with exertion  . Emphysema/COPD ([place])   . GERD (gastroesophageal reflux disease)    denies  . History of glaucoma   . History of kidney stones   . History of pancreatitis    2008--  mercaptopurine  . History of small bowel obstruction    12-03-2010  due to crohn's ileitis  . Hypertension    no longer on medications  . Hypothyroidism, postsurgical    multinodule w/ hurthle cells  . Nephrolithiasis    bilateral  . Osteoporosis   . Perianal Crohn's disease ([name])   . Peripheral vascular disease ([place])    blood clot behind knee left leg  . Polyarthralgia   . Wears partial dentures    upper    Past Surgical History:  Procedure Laterality Date  . ABDOMINAL HYSTERECTOMY  1990   and  Appendectomy  . BASCILIC VEIN TRANSPOSITION Left 01/09/2017   Procedure: LEFT 1ST STAGE BRACHIAL VEIN TRANSPOSITION;  Surgeon: Conrad Hartford, MD;  Location: [facility];  Service: Vascular;  Laterality: Left;  . COLON RESECTION  x3 --  1978,  1987, [place] x2/    [place]  . COLONOSCOPY WITH PROPOFOL N/A 09/25/2014   Procedure: COLONOSCOPY WITH PROPOFOL;  Surgeon: Garlan Fair, MD;  Location: [facility];  Service: Endoscopy;  Laterality: N/A;  . CYSTOSCOPY W/ URETERAL STENT PLACEMENT Right 11/19/2016   Procedure: CYSTOSCOPY WITH RIGHT RETROGRADE PYELOGRAM/ URETEROSCOPY WITH LASER AND RIGHT URETERAL STENT PLACEMENT;  Surgeon: Ardis Hughs, MD;  Location: [facility];  Service: Urology;  Laterality: Right;  . ESOPHAGOGASTRODUODENOSCOPY  03/04/2012   Procedure: ESOPHAGOGASTRODUODENOSCOPY (EGD);  Surgeon: Garlan Fair, MD;  Location: [facility];  Service: Endoscopy;  Laterality: N/A;  . EXAMINATION UNDER ANESTHESIA N/A 09/12/2014   Procedure: EXAM UNDER ANESTHESIA;  Surgeon: Rolm Bookbinder, MD;  Location: [facility];  Service: General;  Laterality: N/A;  . FLEXIBLE SIGMOIDOSCOPY  03/04/2012   Procedure: FLEXIBLE SIGMOIDOSCOPY;  Surgeon: Garlan Fair, MD;  Location: [facility];  Service: Endoscopy;  Laterality: N/A;  . GLAUCOMA SURGERY Bilateral   . HOLMIUM LASER APPLICATION Right 7/40/8144   Procedure: HOLMIUM LASER APPLICATION;  Surgeon: Ardis Hughs, MD;  Location: [facility];  Service: Urology;  Laterality: Right;  . INCISION AND DRAINAGE PERIRECTAL ABSCESS N/A 09/12/2014   Procedure: IRRIGATION AND DEBRIDEMENT PERIRECTAL ABSCESS;  Surgeon: Rolm Bookbinder, MD;  Location: [facility];  Service: General;  Laterality: N/A;  . IR FLUORO GUIDE CV LINE RIGHT  03/20/2017  . IR US GUIDE VASC ACCESS RIGHT  03/20/2017  . NEGATIVE SLEEP STUDY  2008  . PLACEMENT OF SETON N/A 12/01/2014   Procedure: PLACEMENT OF SETON;  Surgeon: [name], MD;  Location: [facility];  Service: General;  Laterality: N/A;  . RECTAL EXAM UNDER ANESTHESIA N/A 12/01/2014   Procedure: RECTAL EXAM UNDER ANESTHESIA;  Surgeon: [name], MD;  Location: [facility];  Service: General;  Laterality: N/A;  . TOTAL THYROIDECTOMY   10-13-2003  . TRANSTHORACIC ECHOCARDIOGRAM  07-11-2013   mild LVH,  ef 55%,  moderate AR,  mild MR and TR,  trivial PR    Social History   Socioeconomic History  . Marital status: Married    Spouse name: [name]  . Number of children: 3  . Years of education: 105  . Highest education level: Not on file  Social Needs  . Financial resource strain: Not on file  . Food insecurity - worry: Not on file  . Food insecurity - inability: Not on file  . Transportation needs - medical: Not on file  . Transportation needs - non-medical: Not on file  Occupational History  . Occupation: Retired    Fish farm manager: RETIRED  Tobacco Use  . Smoking status: Former Smoker    Packs/day: 0.25    Years: 15.00    Pack years: 3.75    Types: Cigarettes    Last attempt to quit: 11/27/2001    Years since quitting: 15.5  . Smokeless tobacco: Never Used  Substance and Sexual Activity  . Alcohol use: No  . Drug use: No  . Sexual activity: No  Other Topics Concern  . Not on file  Social History Narrative   Patient is married [name]) and lives at home with her husband.   Patient has three children.   Patient is retired on disability.   Patient drinks two cups of caffeine daily.   Patient is right-handed.           Family History  Problem Relation Age of Onset  . Hypertension Father   . Heart disease Father   . Heart attack Father   . Diverticulitis Mother   . COPD Mother   . Hypertension Mother   . Heart disease Mother   . Heart attack Mother   . Heart attack Brother   . Colon cancer Brother   . Diabetes Brother   . Thyroid disease Brother     Current Outpatient Medications  Medication Sig Dispense Refill  . acetaminophen (TYLENOL) 325 MG tablet Take 325 mg by mouth every 6 (six) hours as needed for mild pain.    . Adalimumab (HUMIRA) 40 MG/0.8ML PSKT Inject 40 mg into the skin every 14 (fourteen) days.    [name] albuterol (PROVENTIL HFA;VENTOLIN HFA) 108 (90 Base) MCG/ACT inhaler Inhale 2 puffs  into the lungs every 6 (six) hours as needed for wheezing or shortness of breath. 1 Inhaler 5  . alendronate (FOSAMAX) 70 MG tablet Take 70 mg by mouth every Monday.   1  . calcitRIOL (ROCALTROL) 0.25 MCG capsule Take 0.25 mcg by mouth every Monday, Wednesday, and Friday with hemodialysis.  0  . cyanocobalamin (,VITAMIN B-12,) 1000 MCG/ML injection Inject 1,000 mcg into the muscle every 3 (three) months.     . Fluticasone-Umeclidin-Vilant (TRELEGY ELLIPTA) 100-62.5-25 MCG/INH AEPB Inhale 1 puff into the lungs daily. 28 each 5  . levothyroxine (SYNTHROID,  LEVOTHROID) 125 MCG tablet Take 125 mcg by mouth daily before breakfast.     . loperamide (IMODIUM) 2 MG capsule Take 2 capsules (4 mg total) by mouth as needed for diarrhea or loose stools. 30 capsule 0  . midodrine (PROAMATINE) 5 MG tablet Take 5 mg by mouth See admin instructions. 5 mg PRIOR TO dialysis on Mon/Wed/Fri  0  . predniSONE (DELTASONE) 5 MG tablet Take 5 mg by mouth daily with breakfast.    . sodium bicarbonate 650 MG tablet Take 2 tablets (1,300 mg total) by mouth 2 (two) times daily. (Patient taking differently: Take 1,300 mg by mouth daily. ) 120 tablet 0  . traMADol (ULTRAM) 50 MG tablet Take 1-2 tablets (50-100 mg total) by mouth every 6 (six) hours as needed for severe pain. 6 tablet 0  . nitroGLYCERIN (NITROSTAT) 0.4 MG SL tablet Place 1 tablet (0.4 mg total) under the tongue every 5 (five) minutes as needed for chest pain. 25 tablet 3   No current facility-administered medications for this visit.      Allergies  Allergen Reactions  . Mercaptopurine Other (See Comments)    Caused pancreatitis  . Remicade [Infliximab] Other (See Comments)    CAUSED JOINT PAIN    REVIEW OF SYSTEMS (negative unless checked):   Cardiac:  []  Chest pain or chest pressure? []  Shortness of breath upon activity? []  Shortness of breath when lying flat? []  Irregular heart rhythm?  Vascular:  []  Pain in calf, thigh, or hip brought on by  walking? []  Pain in feet at night that wakes you up from your sleep? []  Blood clot in your veins? []  Leg swelling?  Pulmonary:  []  Oxygen at home? []  Productive cough? []  Wheezing?  Neurologic:  []  Sudden weakness in arms or legs? []  Sudden numbness in arms or legs? []  Sudden onset of difficult speaking or slurred speech? []  Temporary loss of vision in one eye? []  Problems with dizziness?  Gastrointestinal:  []  Blood in stool? []  Vomited blood?  Genitourinary:  []  Burning when urinating? []  Blood in urine? [x]   End stage renal disease-HD: M/W/F   Psychiatric:  []  Major depression  Hematologic:  []  Bleeding problems? []  Problems with blood clotting?  Dermatologic:  []  Rashes or ulcers?  Constitutional:  []  Fever or chills?  Ear/Nose/Throat:  []  Change in hearing? []  Nose bleeds? []  Sore throat?  Musculoskeletal:  []  Back pain? []  Joint pain? []  Muscle pain?  Physical Examination   Vitals:   06/12/17 1013  BP: 126/74  Pulse: (!) 102  Resp: 18  SpO2: 100%  Weight: 100 lb (45.4 kg)  Height: 5' 4"  (1.626 m)   Pulmonary Sym exp, good air movt, CTAB, no rales, rhonchi, & wheezing  Cardiac RRR, Nl S1, S2, no Murmurs, rubs or gallops   left arm Incision is healed, skin feels warm, hand grip is 5/5, sensation in digits is intact, palpable thrill, bruit can be auscultated, on Sonosite: fistula 5.5-6.0 mm moves from ant. To posterior to artery    Medical Decision Making   Tomasa Dobransky Pizana is a 72 y.o. (01/21/1946) female who presents s/p left first stage brachial vein transposition   Fistula looks big enough to attempt transposition.  Basilic vein also pressurized, so revision to BVT might be possible.  Risk, benefits, and alternatives to access surgery were discussed.    The patient is aware the risks include but are not limited to: bleeding, infection, steal syndrome, nerve damage, ischemic monomelic neuropathy, thrombosis, failure to  mature,  complications related to venous hypertension, need for additional procedures, death and stroke.    The patient agrees to proceed forward with the procedure.  Thank you for allowing [place] to participate in this patient's care.   Adele Barthel, MD, FACS [facility] Office: [phone] Pager: [phone]

## 2017-06-11 NOTE — H&P (View-Only) (Signed)
Postoperative Access Visit   History of Present Illness   Natasha [name] is a 72 y.o. (12-Aug-1945) female who presents for postoperative follow-up for: left first stage brachial vein transposition (Date: 01/09/17).  The patient's wounds are healed.  The patient notes no steal symptoms.  The patient is able to complete their activities of daily living.  The patient's current symptoms are: none.  Pt returns for evaluation for maturation of L 1st stage BrVT.  Past Medical History:  Diagnosis Date  . Anal fistula   . Anemia in chronic kidney disease (CKD)   . Aortic insufficiency    Echo 3/18: mod conc LVH, EF 60-65, no RWMA, Gr 1 DD, mod AI, mild MR, normal RVSF, Trivial TR  . Arthritis   . Borderline hypertension   . Bulging disc    L3-L4  . Chronic diarrhea    due to crohn's  . CKD (chronic kidney disease), stage IV ([place])    NEPHROLOGIST--  DR MATTINGLY  . Crohn's disease ([place])    chronic ileitis  . Dyspnea    with exertion  . Emphysema/COPD ([place])   . GERD (gastroesophageal reflux disease)    denies  . History of glaucoma   . History of kidney stones   . History of pancreatitis    2008--  mercaptopurine  . History of small bowel obstruction    12-03-2010  due to crohn's ileitis  . Hypertension    no longer on medications  . Hypothyroidism, postsurgical    multinodule w/ hurthle cells  . Nephrolithiasis    bilateral  . Osteoporosis   . Perianal Crohn's disease ([place])   . Peripheral vascular disease ([place])    blood clot behind knee left leg  . Polyarthralgia   . Wears partial dentures    upper    Past Surgical History:  Procedure Laterality Date  . ABDOMINAL HYSTERECTOMY  1990   and  Appendectomy  . BASCILIC VEIN TRANSPOSITION Left 01/09/2017   Procedure: LEFT 1ST STAGE BRACHIAL VEIN TRANSPOSITION;  Surgeon: Conrad Gurley, MD;  Location: [facility];  Service: Vascular;  Laterality: Left;  . COLON RESECTION  x3 --  1978,  1987, [place] x2/    [place]  . COLONOSCOPY WITH PROPOFOL N/A 09/25/2014   Procedure: COLONOSCOPY WITH PROPOFOL;  Surgeon: Garlan Fair, MD;  [facility]: [facility];  Service: Endoscopy;  Laterality: N/A;  . CYSTOSCOPY W/ URETERAL STENT PLACEMENT Right 11/19/2016   Procedure: CYSTOSCOPY WITH RIGHT RETROGRADE PYELOGRAM/ URETEROSCOPY WITH LASER AND RIGHT URETERAL STENT PLACEMENT;  Surgeon: Ardis Hughs, MD;  Location: [facility];  Service: Urology;  Laterality: Right;  . ESOPHAGOGASTRODUODENOSCOPY  03/04/2012   Procedure: ESOPHAGOGASTRODUODENOSCOPY (EGD);  Surgeon: Garlan Fair, MD;  Location: [facility];  Service: Endoscopy;  Laterality: N/A;  . EXAMINATION UNDER ANESTHESIA N/A 09/12/2014   Procedure: EXAM UNDER ANESTHESIA;  Surgeon: Rolm Bookbinder, MD;  Location: [facility];  Service: General;  Laterality: N/A;  . FLEXIBLE SIGMOIDOSCOPY  03/04/2012   Procedure: FLEXIBLE SIGMOIDOSCOPY;  Surgeon: Garlan Fair, MD;  [facility];  Service: Endoscopy;  Laterality: N/A;  . GLAUCOMA SURGERY Bilateral   . HOLMIUM LASER APPLICATION Right 5/97/4163   Procedure: HOLMIUM LASER APPLICATION;  Surgeon: Ardis Hughs, MD;  [facility];  Service: Urology;  Laterality: Right;  . INCISION AND DRAINAGE PERIRECTAL ABSCESS N/A 09/12/2014   Procedure: IRRIGATION AND DEBRIDEMENT PERIRECTAL ABSCESS;  Surgeon: Rolm Bookbinder, MD;  Location: [facility];  Service: General;  Laterality: N/A;  . IR FLUORO GUIDE CV LINE RIGHT  03/20/2017  . IR US GUIDE VASC ACCESS RIGHT  03/20/2017  . NEGATIVE SLEEP STUDY  2008  . PLACEMENT OF SETON N/A 12/01/2014   Procedure: PLACEMENT OF SETON;  Surgeon: [name], MD;  Location: [facility];  Service: General;  Laterality: N/A;  . RECTAL EXAM UNDER ANESTHESIA N/A 12/01/2014   Procedure: RECTAL EXAM UNDER ANESTHESIA;  Surgeon: [name], MD;  Location: [facility];  Service: General;  Laterality: N/A;  . TOTAL THYROIDECTOMY   10-13-2003  . TRANSTHORACIC ECHOCARDIOGRAM  07-11-2013   mild LVH,  ef 55%,  moderate AR,  mild MR and TR,  trivial PR    Social History   Socioeconomic History  . Marital status: Married    Spouse name: Natasha [name]  . Number of children: 3  . Years of education: 40  . Highest education level: Not on file  Social Needs  . Financial resource strain: Not on file  . Food insecurity - worry: Not on file  . Food insecurity - inability: Not on file  . Transportation needs - medical: Not on file  . Transportation needs - non-medical: Not on file  Occupational History  . Occupation: Retired    Fish farm manager: RETIRED  Tobacco Use  . Smoking status: Former Smoker    Packs/day: 0.25    Years: 15.00    Pack years: 3.75    Types: Cigarettes    Last attempt to quit: 11/27/2001    Years since quitting: 15.5  . Smokeless tobacco: Never Used  Substance and Sexual Activity  . Alcohol use: No  . Drug use: No  . Sexual activity: No  Other Topics Concern  . Not on file  Social History Narrative   Patient is married Natasha [name]) and lives at home with her husband.   Patient has three children.   Patient is retired on disability.   Patient drinks two cups of caffeine daily.   Patient is right-handed.           Family History  Problem Relation Age of Onset  . Hypertension Father   . Heart disease Father   . Heart attack Father   . Diverticulitis Mother   . COPD Mother   . Hypertension Mother   . Heart disease Mother   . Heart attack Mother   . Heart attack Brother   . Colon cancer Brother   . Diabetes Brother   . Thyroid disease Brother     Current Outpatient Medications  Medication Sig Dispense Refill  . acetaminophen (TYLENOL) 325 MG tablet Take 325 mg by mouth every 6 (six) hours as needed for mild pain.    . Adalimumab (HUMIRA) 40 MG/0.8ML PSKT Inject 40 mg into the skin every 14 (fourteen) days.    [name] albuterol (PROVENTIL HFA;VENTOLIN HFA) 108 (90 Base) MCG/ACT inhaler Inhale 2 puffs  into the lungs every 6 (six) hours as needed for wheezing or shortness of breath. 1 Inhaler 5  . alendronate (FOSAMAX) 70 MG tablet Take 70 mg by mouth every Monday.   1  . calcitRIOL (ROCALTROL) 0.25 MCG capsule Take 0.25 mcg by mouth every Monday, Wednesday, and Friday with hemodialysis.  0  . cyanocobalamin (,VITAMIN B-12,) 1000 MCG/ML injection Inject 1,000 mcg into the muscle every 3 (three) months.     . Fluticasone-Umeclidin-Vilant (TRELEGY ELLIPTA) 100-62.5-25 MCG/INH AEPB Inhale 1 puff into the lungs daily. 28 each 5  . levothyroxine (SYNTHROID,  LEVOTHROID) 125 MCG tablet Take 125 mcg by mouth daily before breakfast.     . loperamide (IMODIUM) 2 MG capsule Take 2 capsules (4 mg total) by mouth as needed for diarrhea or loose stools. 30 capsule 0  . midodrine (PROAMATINE) 5 MG tablet Take 5 mg by mouth See admin instructions. 5 mg PRIOR TO dialysis on Mon/Wed/Fri  0  . predniSONE (DELTASONE) 5 MG tablet Take 5 mg by mouth daily with breakfast.    . sodium bicarbonate 650 MG tablet Take 2 tablets (1,300 mg total) by mouth 2 (two) times daily. (Patient taking differently: Take 1,300 mg by mouth daily. ) 120 tablet 0  . traMADol (ULTRAM) 50 MG tablet Take 1-2 tablets (50-100 mg total) by mouth every 6 (six) hours as needed for severe pain. 6 tablet 0  . nitroGLYCERIN (NITROSTAT) 0.4 MG SL tablet Place 1 tablet (0.4 mg total) under the tongue every 5 (five) minutes as needed for chest pain. 25 tablet 3   No current facility-administered medications for this visit.      Allergies  Allergen Reactions  . Mercaptopurine Other (See Comments)    Caused pancreatitis  . Remicade [Infliximab] Other (See Comments)    CAUSED JOINT PAIN    REVIEW OF SYSTEMS (negative unless checked):   Cardiac:  []  Chest pain or chest pressure? []  Shortness of breath upon activity? []  Shortness of breath when lying flat? []  Irregular heart rhythm?  Vascular:  []  Pain in calf, thigh, or hip brought on by  walking? []  Pain in feet at night that wakes you up from your sleep? []  Blood clot in your veins? []  Leg swelling?  Pulmonary:  []  Oxygen at home? []  Productive cough? []  Wheezing?  Neurologic:  []  Sudden weakness in arms or legs? []  Sudden numbness in arms or legs? []  Sudden onset of difficult speaking or slurred speech? []  Temporary loss of vision in one eye? []  Problems with dizziness?  Gastrointestinal:  []  Blood in stool? []  Vomited blood?  Genitourinary:  []  Burning when urinating? []  Blood in urine? [x]   End stage renal disease-HD: M/W/F   Psychiatric:  []  Major depression  Hematologic:  []  Bleeding problems? []  Problems with blood clotting?  Dermatologic:  []  Rashes or ulcers?  Constitutional:  []  Fever or chills?  Ear/Nose/Throat:  []  Change in hearing? []  Nose bleeds? []  Sore throat?  Musculoskeletal:  []  Back pain? []  Joint pain? []  Muscle pain?  Physical Examination   Vitals:   06/12/17 1013  BP: 126/74  Pulse: (!) 102  Resp: 18  SpO2: 100%  Weight: 100 lb (45.4 kg)  Height: 5' 4"  (1.626 m)   Pulmonary Sym exp, good air movt, CTAB, no rales, rhonchi, & wheezing  Cardiac RRR, Nl S1, S2, no Murmurs, rubs or gallops   left arm Incision is healed, skin feels warm, hand grip is 5/5, sensation in digits is intact, palpable thrill, bruit can be auscultated, on Sonosite: fistula 5.5-6.0 mm moves from ant. To posterior to artery    Medical Decision Making   Arianny Pun Vanderslice is a 72 y.o. (04-21-1946) female who presents s/p left first stage brachial vein transposition   Fistula looks big enough to attempt transposition.  Basilic vein also pressurized, so revision to BVT might be possible.  Risk, benefits, and alternatives to access surgery were discussed.    The patient is aware the risks include but are not limited to: bleeding, infection, steal syndrome, nerve damage, ischemic monomelic neuropathy, thrombosis, failure to  mature,  complications related to venous hypertension, need for additional procedures, death and stroke.    The patient agrees to proceed forward with the procedure.  Thank you for allowing [place] to participate in this patient's care.   Adele Barthel, MD, FACS [facility] Office: [phone] Pager: [phone]

## 2017-06-12 ENCOUNTER — Encounter: Payer: Self-pay | Admitting: *Deleted

## 2017-06-12 ENCOUNTER — Encounter: Payer: Self-pay | Admitting: Vascular Surgery

## 2017-06-12 ENCOUNTER — Other Ambulatory Visit: Payer: Self-pay | Admitting: *Deleted

## 2017-06-12 ENCOUNTER — Ambulatory Visit (INDEPENDENT_AMBULATORY_CARE_PROVIDER_SITE_OTHER): Payer: Medicare Other | Admitting: Vascular Surgery

## 2017-06-12 VITALS — BP 126/74 | HR 102 | Resp 18 | Ht 64.0 in | Wt 100.0 lb

## 2017-06-12 DIAGNOSIS — Z992 Dependence on renal dialysis: Secondary | ICD-10-CM

## 2017-06-12 DIAGNOSIS — N186 End stage renal disease: Secondary | ICD-10-CM

## 2017-06-15 ENCOUNTER — Encounter (HOSPITAL_COMMUNITY): Payer: Self-pay | Admitting: *Deleted

## 2017-06-15 ENCOUNTER — Other Ambulatory Visit: Payer: Self-pay

## 2017-06-16 ENCOUNTER — Ambulatory Visit (HOSPITAL_COMMUNITY)
Admission: RE | Admit: 2017-06-16 | Discharge: 2017-06-16 | Disposition: A | Discharge status: Home / Self Care | Payer: Medicare Other | Source: Ambulatory Visit | Attending: Vascular Surgery | Admitting: Vascular Surgery

## 2017-06-16 ENCOUNTER — Encounter (HOSPITAL_COMMUNITY)
Admission: RE | Disposition: A | Discharge status: Home / Self Care | Payer: Self-pay | Source: Ambulatory Visit | Attending: Vascular Surgery

## 2017-06-16 ENCOUNTER — Encounter (HOSPITAL_COMMUNITY): Payer: Self-pay

## 2017-06-16 ENCOUNTER — Ambulatory Visit (HOSPITAL_COMMUNITY): Payer: Medicare Other | Admitting: Certified Registered Nurse Anesthetist

## 2017-06-16 DIAGNOSIS — Z79899 Other long term (current) drug therapy: Secondary | ICD-10-CM | POA: Insufficient documentation

## 2017-06-16 DIAGNOSIS — K50812 Crohn's disease of both small and large intestine with intestinal obstruction: Secondary | ICD-10-CM | POA: Insufficient documentation

## 2017-06-16 DIAGNOSIS — I739 Peripheral vascular disease, unspecified: Secondary | ICD-10-CM | POA: Diagnosis not present

## 2017-06-16 DIAGNOSIS — N185 Chronic kidney disease, stage 5: Secondary | ICD-10-CM | POA: Diagnosis not present

## 2017-06-16 DIAGNOSIS — E039 Hypothyroidism, unspecified: Secondary | ICD-10-CM | POA: Diagnosis not present

## 2017-06-16 DIAGNOSIS — K219 Gastro-esophageal reflux disease without esophagitis: Secondary | ICD-10-CM | POA: Diagnosis not present

## 2017-06-16 DIAGNOSIS — Z87442 Personal history of urinary calculi: Secondary | ICD-10-CM | POA: Diagnosis not present

## 2017-06-16 DIAGNOSIS — Z87891 Personal history of nicotine dependence: Secondary | ICD-10-CM | POA: Diagnosis not present

## 2017-06-16 DIAGNOSIS — J439 Emphysema, unspecified: Secondary | ICD-10-CM | POA: Insufficient documentation

## 2017-06-16 DIAGNOSIS — D631 Anemia in chronic kidney disease: Secondary | ICD-10-CM | POA: Diagnosis not present

## 2017-06-16 DIAGNOSIS — I120 Hypertensive chronic kidney disease with stage 5 chronic kidney disease or end stage renal disease: Secondary | ICD-10-CM | POA: Insufficient documentation

## 2017-06-16 DIAGNOSIS — H409 Unspecified glaucoma: Secondary | ICD-10-CM | POA: Insufficient documentation

## 2017-06-16 DIAGNOSIS — N186 End stage renal disease: Secondary | ICD-10-CM | POA: Insufficient documentation

## 2017-06-16 HISTORY — PX: BASCILIC VEIN TRANSPOSITION: SHX5742

## 2017-06-16 HISTORY — DX: Personal history of other medical treatment: Z92.89

## 2017-06-16 LAB — POCT I-STAT 4, (NA,K, GLUC, HGB,HCT)
Glucose, Bld: 65 mg/dL (ref 65–99)
HCT: 40 % (ref 36.0–46.0)
HEMOGLOBIN: 13.6 g/dL (ref 12.0–15.0)
Potassium: 3.8 mmol/L (ref 3.5–5.1)
SODIUM: 140 mmol/L (ref 135–145)

## 2017-06-16 LAB — GLUCOSE, CAPILLARY
Glucose-Capillary: 55 mg/dL — ABNORMAL LOW (ref 65–99)
Glucose-Capillary: 61 mg/dL — ABNORMAL LOW (ref 65–99)
Glucose-Capillary: 96 mg/dL (ref 65–99)

## 2017-06-16 SURGERY — TRANSPOSITION, VEIN, BASILIC
Anesthesia: General | Site: Arm Upper | Laterality: Left

## 2017-06-16 MED ORDER — SODIUM CHLORIDE 0.9 % IV SOLN
INTRAVENOUS | Status: DC
  Administered 2017-06-16 (×2): via INTRAVENOUS

## 2017-06-16 MED ORDER — LIDOCAINE HCL (PF) 1 % IJ SOLN
INTRAMUSCULAR | Status: AC
  Filled 2017-06-16: qty 30

## 2017-06-16 MED ORDER — FENTANYL CITRATE (PF) 250 MCG/5ML IJ SOLN
INTRAMUSCULAR | Status: AC
Start: 2017-06-16 — End: ?
  Filled 2017-06-16: qty 5

## 2017-06-16 MED ORDER — OXYCODONE HCL 5 MG PO TABS
ORAL_TABLET | ORAL | Status: DC
Start: 2017-06-16 — End: 2017-06-16
  Filled 2017-06-16: qty 1

## 2017-06-16 MED ORDER — PROPOFOL 10 MG/ML IV BOLUS
INTRAVENOUS | Status: DC | PRN
  Administered 2017-06-16: 120 mg via INTRAVENOUS

## 2017-06-16 MED ORDER — DEXTROSE 50 % IV SOLN
INTRAVENOUS | Status: AC
  Administered 2017-06-16: 25 mL via INTRAVENOUS
  Filled 2017-06-16: qty 50

## 2017-06-16 MED ORDER — LIDOCAINE 2% (20 MG/ML) 5 ML SYRINGE
INTRAMUSCULAR | Status: AC
  Filled 2017-06-16: qty 10

## 2017-06-16 MED ORDER — HEMOSTATIC AGENTS (NO CHARGE) OPTIME
TOPICAL | Status: DC | PRN
  Administered 2017-06-16 (×2): 1 via TOPICAL

## 2017-06-16 MED ORDER — FENTANYL CITRATE (PF) 100 MCG/2ML IJ SOLN
INTRAMUSCULAR | Status: AC
  Administered 2017-06-16: 25 ug via INTRAVENOUS
  Filled 2017-06-16: qty 2

## 2017-06-16 MED ORDER — PHENYLEPHRINE 40 MCG/ML (10ML) SYRINGE FOR IV PUSH (FOR BLOOD PRESSURE SUPPORT)
PREFILLED_SYRINGE | INTRAVENOUS | Status: DC | PRN
  Administered 2017-06-16: 40 ug via INTRAVENOUS
  Administered 2017-06-16: 80 ug via INTRAVENOUS

## 2017-06-16 MED ORDER — CEFUROXIME SODIUM 1.5 G IV SOLR
1.5000 g | INTRAVENOUS | Status: AC
  Administered 2017-06-16: 1.5 g via INTRAVENOUS
  Filled 2017-06-16: qty 1.5

## 2017-06-16 MED ORDER — LIDOCAINE 2% (20 MG/ML) 5 ML SYRINGE
INTRAMUSCULAR | Status: DC | PRN
  Administered 2017-06-16: 60 mg via INTRAVENOUS

## 2017-06-16 MED ORDER — HYDROCODONE-ACETAMINOPHEN 5-325 MG PO TABS: 1.0000 | ORAL_TABLET | Freq: Four times a day (QID) | ORAL | 0 refills | Status: DC | PRN

## 2017-06-16 MED ORDER — SODIUM CHLORIDE 0.9 % IV SOLN
INTRAVENOUS | Status: DC | PRN
  Administered 2017-06-16: 500 mL

## 2017-06-16 MED ORDER — ONDANSETRON HCL 4 MG/2ML IJ SOLN
INTRAMUSCULAR | Status: AC
  Filled 2017-06-16: qty 4

## 2017-06-16 MED ORDER — DEXTROSE 50 % IV SOLN
25.0000 mL | Freq: Once | INTRAVENOUS | Status: AC
  Administered 2017-06-16: 25 mL via INTRAVENOUS
  Filled 2017-06-16: qty 50

## 2017-06-16 MED ORDER — DEXTROSE 50 % IV SOLN
12.5000 g | Freq: Once | INTRAVENOUS | Status: AC
  Administered 2017-06-16: 12.5 g via INTRAVENOUS

## 2017-06-16 MED ORDER — FENTANYL CITRATE (PF) 100 MCG/2ML IJ SOLN
25.0000 ug | INTRAMUSCULAR | Status: DC | PRN
  Administered 2017-06-16 (×4): 25 ug via INTRAVENOUS

## 2017-06-16 MED ORDER — DEXAMETHASONE SODIUM PHOSPHATE 10 MG/ML IJ SOLN
INTRAMUSCULAR | Status: DC | PRN
  Administered 2017-06-16: 5 mg via INTRAVENOUS

## 2017-06-16 MED ORDER — OXYCODONE HCL 5 MG PO TABS
5.0000 mg | ORAL_TABLET | Freq: Once | ORAL | Status: AC | PRN
  Administered 2017-06-16: 5 mg via ORAL

## 2017-06-16 MED ORDER — ONDANSETRON HCL 4 MG/2ML IJ SOLN
INTRAMUSCULAR | Status: DC | PRN
  Administered 2017-06-16: 4 mg via INTRAVENOUS

## 2017-06-16 MED ORDER — PROPOFOL 10 MG/ML IV BOLUS
INTRAVENOUS | Status: AC
  Filled 2017-06-16: qty 20

## 2017-06-16 MED ORDER — DEXAMETHASONE SODIUM PHOSPHATE 10 MG/ML IJ SOLN
INTRAMUSCULAR | Status: AC
  Filled 2017-06-16: qty 2

## 2017-06-16 MED ORDER — 0.9 % SODIUM CHLORIDE (POUR BTL) OPTIME
TOPICAL | Status: DC | PRN
  Administered 2017-06-16: 1000 mL

## 2017-06-16 MED ORDER — ONDANSETRON HCL 4 MG/2ML IJ SOLN: 4.0000 mg | Freq: Four times a day (QID) | INTRAMUSCULAR | Status: DC | PRN

## 2017-06-16 MED ORDER — FENTANYL CITRATE (PF) 100 MCG/2ML IJ SOLN
INTRAMUSCULAR | Status: DC | PRN
  Administered 2017-06-16 (×5): 25 ug via INTRAVENOUS

## 2017-06-16 MED ORDER — OXYCODONE HCL 5 MG/5ML PO SOLN: 5.0000 mg | Freq: Once | ORAL | Status: AC | PRN

## 2017-06-16 MED ORDER — PHENYLEPHRINE HCL 10 MG/ML IJ SOLN
INTRAVENOUS | Status: DC | PRN
  Administered 2017-06-16: 50 ug/min via INTRAVENOUS

## 2017-06-16 SURGICAL SUPPLY — 47 items
ADH SKN CLS APL DERMABOND .7 (GAUZE/BANDAGES/DRESSINGS) ×1
AGENT HMST SPONGE THK3/8 (HEMOSTASIS) ×2
ARMBAND PINK RESTRICT EXTREMIT (MISCELLANEOUS) ×3 IMPLANT
CANISTER SUCT 3000ML PPV (MISCELLANEOUS) ×3 IMPLANT
CLIP VESOCCLUDE MED 24/CT (CLIP) ×3 IMPLANT
CLIP VESOCCLUDE SM WIDE 24/CT (CLIP) ×3 IMPLANT
CORDS BIPOLAR (ELECTRODE) IMPLANT
COVER PROBE W GEL 5X96 (DRAPES) ×3 IMPLANT
DECANTER SPIKE VIAL GLASS SM (MISCELLANEOUS) ×3 IMPLANT
DERMABOND ADVANCED (GAUZE/BANDAGES/DRESSINGS) ×2
DERMABOND ADVANCED .7 DNX12 (GAUZE/BANDAGES/DRESSINGS) ×1 IMPLANT
ELECT REM PT RETURN 9FT ADLT (ELECTROSURGICAL) ×3
ELECTRODE REM PT RTRN 9FT ADLT (ELECTROSURGICAL) ×1 IMPLANT
GLOVE BIO SURGEON STRL SZ 6.5 (GLOVE) ×2 IMPLANT
GLOVE BIO SURGEON STRL SZ7 (GLOVE) ×5 IMPLANT
GLOVE BIO SURGEONS STRL SZ 6.5 (GLOVE) ×2
GLOVE BIOGEL PI IND STRL 7.5 (GLOVE) ×1 IMPLANT
GLOVE BIOGEL PI INDICATOR 7.5 (GLOVE) ×2
GLOVE SKINSENSE NS SZ7.0 (GLOVE) ×2
GLOVE SKINSENSE STRL SZ7.0 (GLOVE) ×1 IMPLANT
GOWN STRL REUS W/ TWL LRG LVL3 (GOWN DISPOSABLE) ×3 IMPLANT
GOWN STRL REUS W/ TWL XL LVL3 (GOWN DISPOSABLE) IMPLANT
GOWN STRL REUS W/TWL LRG LVL3 (GOWN DISPOSABLE) ×9
GOWN STRL REUS W/TWL XL LVL3 (GOWN DISPOSABLE) ×6
HEMOSTAT SPONGE AVITENE ULTRA (HEMOSTASIS) ×6 IMPLANT
KIT BASIN OR (CUSTOM PROCEDURE TRAY) ×3 IMPLANT
KIT ROOM TURNOVER OR (KITS) ×3 IMPLANT
NDL HYPO 25GX1X1/2 BEV (NEEDLE) ×1 IMPLANT
NEEDLE HYPO 25GX1X1/2 BEV (NEEDLE) ×3 IMPLANT
NS IRRIG 1000ML POUR BTL (IV SOLUTION) ×3 IMPLANT
PACK CV ACCESS (CUSTOM PROCEDURE TRAY) ×3 IMPLANT
PAD ARMBOARD 7.5X6 YLW CONV (MISCELLANEOUS) ×6 IMPLANT
SUT MNCRL AB 4-0 PS2 18 (SUTURE) ×6 IMPLANT
SUT PROLENE 6 0 BV (SUTURE) ×3 IMPLANT
SUT PROLENE 7 0 BV 1 (SUTURE) ×6 IMPLANT
SUT SILK 2 0 SH (SUTURE) ×2 IMPLANT
SUT SILK 3 0 (SUTURE) ×3
SUT SILK 3-0 18XBRD TIE 12 (SUTURE) ×1 IMPLANT
SUT SILK 4 0 (SUTURE) ×6
SUT SILK 4-0 18XBRD TIE 12 (SUTURE) IMPLANT
SUT VIC AB 2-0 CT1 27 (SUTURE) ×3
SUT VIC AB 2-0 CT1 TAPERPNT 27 (SUTURE) ×1 IMPLANT
SUT VIC AB 3-0 SH 27 (SUTURE) ×6
SUT VIC AB 3-0 SH 27X BRD (SUTURE) ×2 IMPLANT
TOWEL GREEN STERILE (TOWEL DISPOSABLE) ×3 IMPLANT
UNDERPAD 30X30 (UNDERPADS AND DIAPERS) ×3 IMPLANT
WATER STERILE IRR 1000ML POUR (IV SOLUTION) ×3 IMPLANT

## 2017-06-16 NOTE — Progress Notes (Signed)
Dialysis record faxed to [facility]

## 2017-06-16 NOTE — Anesthesia Procedure Notes (Signed)
Procedure Name: LMA Insertion Date/Time: 06/16/2017 9:54 AM Performed by: Valda Favia, CRNA Pre-anesthesia Checklist: Patient identified, Emergency Drugs available, Suction available, Patient being monitored and Timeout performed Patient Re-evaluated:Patient Re-evaluated prior to induction Oxygen Delivery Method: Circle system utilized Preoxygenation: Pre-oxygenation with 100% oxygen Induction Type: IV induction LMA: LMA inserted LMA Size: 4.0 Number of attempts: 1 Placement Confirmation: positive ETCO2 and breath sounds checked- equal and bilateral Tube secured with: Tape Dental Injury: Teeth and Oropharynx as per pre-operative assessment

## 2017-06-16 NOTE — Interval H&P Note (Signed)
History and Physical Interval Note:  06/16/2017 7:19 AM  [place]  has presented today for surgery, with the diagnosis of END STAGE RENAL DISEASE FOR HEMODIALYSIS ACCESS  The various methods of treatment have been discussed with the patient and family. After consideration of risks, benefits and other options for treatment, the patient has consented to  Procedure(s): BASILIC [place] (Left) as a surgical intervention .  The patient's history has been reviewed, patient examined, no change in status, stable for surgery.  I have reviewed the patient's chart and labs.  Questions were answered to the patient's satisfaction.     Adele Barthel

## 2017-06-16 NOTE — Anesthesia Postprocedure Evaluation (Signed)
Anesthesia Post Note  Patient: Natasha [name]  Procedure(s) Performed: Second Stage BASILIC VEIN TRANSPOSITION  LEFT ARM (Left Arm Upper)     Patient location during evaluation: PACU Anesthesia Type: General Level of consciousness: awake and alert Pain management: pain level controlled Vital Signs Assessment: post-procedure vital signs reviewed and stable Respiratory status: spontaneous breathing, nonlabored ventilation, respiratory function stable and patient connected to nasal cannula oxygen Cardiovascular status: blood pressure returned to baseline and stable Postop Assessment: no apparent nausea or vomiting Anesthetic complications: no    Last Vitals:  Vitals:   06/16/17 1430 06/16/17 1500  BP: 106/65 123/67  Pulse: 85 85  Resp: 18 16  Temp: (!) 36.4 C   SpO2: 100% 95%    Last Pain:  Vitals:   06/16/17 1430  TempSrc:   PainSc: 5                  Raetta Agostinelli S

## 2017-06-16 NOTE — Op Note (Signed)
OPERATIVE NOTE   PROCEDURE: left second stage brachial vein transposition (brachiobrachial arteriovenous fistula) placement with revision of anastomosis  PRE-OPERATIVE DIAGNOSIS: s/p successful first stage brachial vein transposition   POST-OPERATIVE DIAGNOSIS: same as above   SURGEON: Adele Barthel, MD  ASSISTANT(S): Dagoberto Ligas, PAC   ANESTHESIA: general  ESTIMATED BLOOD LOSS: 50 cc  FINDING(S): 1.  Fistula: 5-6 mm throughout, vein runs deep to secondary neurovascular bundle so revision was necessary 2.  Neointimal hyperplasia in swing segment 3.  Easily palpable thrill in fistula at end of case 4.  Faintly palpable radial pulse at end of case  SPECIMEN(S):  none  INDICATIONS:   Natasha [name] is a 72 y.o. female who presents with end stage renal disease.  The patient is scheduled for left second stage brachial vein transposition.  The patient is aware the risks include but are not limited to: bleeding, infection, steal syndrome, nerve damage, ischemic monomelic neuropathy, failure to mature, and need for additional procedures.  The patient is aware of the risks of the procedure and elects to proceed forward.   DESCRIPTION: After full informed written consent was obtained from the patient, the patient was brought back to the operating room and placed supine upon the operating table.  Prior to induction, the patient received IV antibiotics.   After obtaining adequate anesthesia, the patient was then prepped and draped in the standard fashion for a left arm access procedure.  I turned my attention first to identifying the patient's brachiobrachial arteriovenous fistula.  Using SonoSite guidance, the location of this fistula was marked out on the skin.  I made an longitudinal incision over the fistula from its arterial anastomosis up to its axillary extent.  I carefully dissected the fistula away from its adjacent nerves, ligating side branches in the process.  In this process,  it was evident that the fistula deep and lateral to a secondary neurovascular bundle, so revision of the anastomosis was going to be needed to preserve the secondary neurovascular bundle.  Eventually the entirety of this fistula was mobilized.    I clamped the fistula proximally and then tied a 2-0 silk just distal to the prior anastomosis, essentially leaving a vein patch on the brachial artery.  I inspected the distal fistula.  There was significant neointimal hyperplasia, occupying >50% of the lumen.  I then determined where the length would allow a new anastomosis.  I then tunneled superficial to the brachial fascia in the distal 1/3 of the upper arm to the axilla in a curved tunnel with a curved clamp.  I clamped the distal end of the fistula and delivered it through the tunnel, taking care to maintain the orientation that had previously been marked.  I clamped the fistula proximally and injected heparinized saline to verify no twisting.  I then clamped the distal fistula.  I dissected out a segment of the brachial artery for the new anastomosis.  I transected the diseased distal segment of the fistula.  I placed the brachial artery under tension proximally and distally.  I made an arteriotomy and extended it proximal and distally.  I sewed the fistula to the brachial artery in an end-to-side configuration with a running stitch of 7-0 Prolene.  Prior to completion, I bled the vein: no clot as competent valves, brachial artery: no clot from either end.  I completed this anastomosis in the usual fashion.  After releasing all vessel loops and clamps, there was immediately a palpable thrill in this fistula.  The deep subcutaneous tissue was inspected for bleeding.  Bleeding was controlled with electrocautery and placement of large pieces of Avitene.  I washed out the surgical site after waiting a few minutes, and there was no further bleeding.  I reapproximated the fascia with horizontal mattress stitches  of 2-0 Vicryl.  The deep subcutaneous tissue was reapproximated with mattress sutures of 3-0 Vicryl to eliminate some of the dead space.  The superficial subcutaneous tissue was then reapproximated along the incision line with a running stitch of 3-0 Vicryl.  The skin was then reapproximated with a running subcuticular of 4-0 Monocryl.  The skin was then cleaned, dried, and reinforced with Dermabond.  The patient tolerated this procedure well.    COMPLICATIONS: none  CONDITION: stable   Adele Barthel, MD, [facility] [place] Office: [phone] Pager: [phone]  06/16/2017, 12:35 PM

## 2017-06-16 NOTE — Anesthesia Preprocedure Evaluation (Signed)
Anesthesia Evaluation  Patient identified by MRN, date of birth, ID band Patient awake    Reviewed: Allergy & Precautions, H&P , NPO status , Patient's Chart, lab work & pertinent test results  Airway Mallampati: II   Neck ROM: full    Dental   Pulmonary shortness of breath, COPD, former smoker,    breath sounds clear to auscultation       Cardiovascular hypertension, + Peripheral Vascular Disease and +CHF   Rhythm:regular Rate:Normal     Neuro/Psych    GI/Hepatic GERD  ,  Endo/Other  Hypothyroidism   Renal/GU ESRFRenal disease     Musculoskeletal  (+) Arthritis ,   Abdominal   Peds  Hematology   Anesthesia Other Findings   Reproductive/Obstetrics                             Anesthesia Physical Anesthesia Plan  ASA: III  Anesthesia Plan: MAC   Post-op Pain Management:    Induction: Intravenous  PONV Risk Score and Plan: 2 and Ondansetron, Propofol infusion and Treatment may vary due to age or medical condition  Airway Management Planned: Simple Face Mask  Additional Equipment:   Intra-op Plan:   Post-operative Plan:   Informed Consent: I have reviewed the patients History and Physical, chart, labs and discussed the procedure including the risks, benefits and alternatives for the proposed anesthesia with the patient or authorized representative who has indicated his/her understanding and acceptance.     Plan Discussed with: CRNA, Anesthesiologist and Surgeon  Anesthesia Plan Comments:         Anesthesia Quick Evaluation

## 2017-06-16 NOTE — Transfer of Care (Signed)
Immediate Anesthesia Transfer of Care Note  Patient: Natasha [name]  Procedure(s) Performed: Second Stage BASILIC VEIN TRANSPOSITION  LEFT ARM (Left Arm Upper)  Patient Location: PACU  Anesthesia Type:General  Level of Consciousness: drowsy  Airway & Oxygen Therapy: Patient Spontanous Breathing and Patient connected to nasal cannula oxygen  Post-op Assessment: Report given to RN and Post -op Vital signs reviewed and stable  Post vital signs: Reviewed and stable  Last Vitals:  Vitals:   06/16/17 0757  BP: 130/73  Pulse: 90  Resp: 16  Temp: 36.9 C  SpO2: 100%    Last Pain:  Vitals:   06/16/17 0757  TempSrc: Oral      Patients Stated Pain Goal: 3 (45/85/92 9244)  Complications: No apparent anesthesia complications

## 2017-06-16 NOTE — Progress Notes (Addendum)
Dr. Marcie Bal made aware of Glu 65. Per, Dr. Marcie Bal, as long as the pt is not having symptoms, we will continue to monitor. Pt advised to inform the nurse if she starts to feel dizzy, weak, or just not herself. Next CBg will be taken at 0925.

## 2017-06-17 ENCOUNTER — Encounter (HOSPITAL_COMMUNITY): Payer: Self-pay | Admitting: Vascular Surgery

## 2017-06-17 ENCOUNTER — Telehealth: Payer: Self-pay | Admitting: Vascular Surgery

## 2017-06-17 NOTE — Telephone Encounter (Signed)
Sched appt 07/15/17 at 1:45. Spoke to pt. Pt has MWF dialysis starting at 12:00. Resched appt to 07/22/17 at 10:00.

## 2017-06-17 NOTE — Telephone Encounter (Signed)
-----   Message from Mena Goes, RN sent at 06/16/2017  1:01 PM EST ----- Regarding: 4 weeks   ----- Message ----- From: Iline Oven Sent: 06/16/2017  12:42 PM To: Vvs Charge Pool  Can you schedule an appt with Dr. Bridgett Larsson in about 4 weeks.  PO L brachial vein transposition. Thanks, [facility]

## 2017-06-26 DIAGNOSIS — G4733 Obstructive sleep apnea (adult) (pediatric): Secondary | ICD-10-CM | POA: Diagnosis not present

## 2017-07-04 ENCOUNTER — Telehealth: Payer: Self-pay | Admitting: Pulmonary Disease

## 2017-07-04 DIAGNOSIS — R0683 Snoring: Secondary | ICD-10-CM | POA: Diagnosis not present

## 2017-07-04 NOTE — Telephone Encounter (Signed)
HST 06/26/17 >> AHI 0.5, SaO2 low 86%.   Please let her know sleep study didn't show significant sleep apnea or low oxygen while asleep.  Will discuss in more detail at next ROV.

## 2017-07-06 NOTE — Telephone Encounter (Signed)
Attempted to contact pt. No answer, no option to leave a message due to her voicemail being full. Will try back.

## 2017-07-07 NOTE — Telephone Encounter (Signed)
Called and spoke with pt giving her the results of the HST.  Pt expressed understanding. Nothing further needed at this current time.

## 2017-07-09 ENCOUNTER — Other Ambulatory Visit: Payer: Self-pay | Admitting: *Deleted

## 2017-07-09 DIAGNOSIS — R29818 Other symptoms and signs involving the nervous system: Secondary | ICD-10-CM

## 2017-07-15 ENCOUNTER — Encounter: Payer: Medicare Other | Admitting: Vascular Surgery

## 2017-07-20 NOTE — Progress Notes (Signed)
    Postoperative Access Visit   History of Present Illness   Natasha [name] is a 72 y.o. year old female who presents for postoperative follow-up for: left second stage brachial vein transposition (Date: 06/16/17).  The patient's wounds are healed.  The patient notes no steal symptoms.  The patient is able to complete their activities of daily living.  The patient's current symptoms are: mild residual incision tenderness.   Physical Examination   Vitals:   07/22/17 1015 07/22/17 1016  BP: (!) 142/83 140/81  Pulse: (!) 104   Resp: 18   Temp: 98.7 F (37.1 C)   TempSrc: Oral   SpO2: 100%   Weight: 100 lb (45.4 kg)   Height: 5' 4"  (1.626 m)     left arm Incision is healed, skin feels warm, hand grip is 5/5, sensation in digits is intact, palpable thrill, bruit can be auscultated     Medical Decision Making   Natasha [name] is a 72 y.o. year old female who presents s/p left second stage brachial vein transposition   The patient's access is ready for use.  The patient's tunneled dialysis catheter can be removed after two successful cannulations and completed dialysis treatments.  Thank you for allowing [place] to participate in this patient's care.   Adele Barthel, MD, FACS [facility] [place] Office: [phone] Pager: [phone]

## 2017-07-22 ENCOUNTER — Encounter: Payer: Self-pay | Admitting: Vascular Surgery

## 2017-07-22 ENCOUNTER — Ambulatory Visit (INDEPENDENT_AMBULATORY_CARE_PROVIDER_SITE_OTHER): Payer: Medicare Other | Admitting: Vascular Surgery

## 2017-07-22 VITALS — BP 140/81 | HR 104 | Temp 98.7°F | Resp 18 | Ht 64.0 in | Wt 100.0 lb

## 2017-07-22 DIAGNOSIS — Z992 Dependence on renal dialysis: Secondary | ICD-10-CM

## 2017-07-22 DIAGNOSIS — N186 End stage renal disease: Secondary | ICD-10-CM

## 2017-07-24 ENCOUNTER — Other Ambulatory Visit: Payer: Self-pay | Admitting: Internal Medicine

## 2017-07-24 DIAGNOSIS — Z139 Encounter for screening, unspecified: Secondary | ICD-10-CM

## 2017-09-03 ENCOUNTER — Ambulatory Visit: Payer: Medicare Other

## 2017-09-24 ENCOUNTER — Ambulatory Visit
Admission: RE | Admit: 2017-09-24 | Discharge: 2017-09-24 | Disposition: A | Discharge status: Home / Self Care | Payer: Medicare Other | Source: Ambulatory Visit | Attending: Internal Medicine | Admitting: Internal Medicine

## 2017-09-24 DIAGNOSIS — Z139 Encounter for screening, unspecified: Secondary | ICD-10-CM

## 2017-10-20 ENCOUNTER — Ambulatory Visit: Payer: Medicare Other | Admitting: Pulmonary Disease

## 2017-10-20 ENCOUNTER — Encounter: Payer: Self-pay | Admitting: Pulmonary Disease

## 2017-10-20 VITALS — BP 128/90 | HR 78 | Ht 64.0 in | Wt 104.2 lb

## 2017-10-20 DIAGNOSIS — J432 Centrilobular emphysema: Secondary | ICD-10-CM

## 2017-10-20 MED ORDER — FLUTICASONE-UMECLIDIN-VILANT 100-62.5-25 MCG/INH IN AEPB: 1.0000 | INHALATION_SPRAY | Freq: Once | RESPIRATORY_TRACT | 0 refills | Status: AC

## 2017-10-20 NOTE — Addendum Note (Signed)
Addended by: Georjean Mode on: 10/20/2017 12:04 PM   Modules accepted: Orders

## 2017-10-20 NOTE — Progress Notes (Signed)
[facility]  Chief Complaint  Patient presents with  . Follow-up    Pt states when she goes to the bathroom in middle of the night, coming back to the bed she is SOB with exertion, and has hard time catching her breath at night. Pt states SOB has increased    Vital signs: BP 128/90 (BP Location: Left Arm, Cuff Size: Normal)   Pulse 78   Ht 5' 4"  (1.626 m)   Wt 104 lb 3.2 oz (47.3 kg)   SpO2 97%   BMI 17.89 kg/m   History of Present Illness: Natasha [name] is a 72 y.o. female former smoker with COPD/Emphysema.  She has noticed more trouble with her breathing when she goes outside.  This has started since the weather has gotten warmer.  She also needs to have air conditioner at certain level, otherwise she gets short of breath.  When she is walking in parking lot and has to walk further because no handicap spots, then she has to stop to catch her breath.  She recovers after several minutes.  She is also waking up to use bathroom at night, and gets winded.  She feels trelegy works well.  When she gets short of breath her albuterol helps.    She is not having sputum, chest pain, palpitations, hemoptysis, fever, sweats, or swelling.    She has f/u with cardiology in June.  Physical Exam:  General - pleasant, thin Eyes - pupils reactive ENT - no sinus tenderness, no oral exudate, no LAN Cardiac - regular, no murmur Chest - no wheeze, rales Abd - soft, non tender Ext - no edema Skin - no rashes Neuro - normal strength Psych - normal mood  Assessment/Plan:  Dyspnea on exertion. - most likely related to weather changes - advised her to try using albuterol before she goes out - she can also try using trelegy later in the day to see if this helps with her nocturnal symptoms - SpO2 levels adequate today, so don't think she needs supplemental oxygen - don't think CXR would provide any additional information at this time - she has f/u with  cardiology in June  Severe COPD with emphysema. - continue trelegy and prn albuterol - samples of trelegy given  Crohn's colitis. - followed at [facility] for prednisone and humira   Stage IV CKD. - followed by [facility]   Patient Instructions  Try using your albuterol before going out in hot weather  Follow up in 6 months    Chesley Mires, MD [place] 10/20/2017, 10:33 AM Pager:  [phone]  Flow Sheet  Pulmonary tests: PFT 08/08/13 >> FEV1 1.00 (52%), FEV1% 50, TLC 3.97 (78%), DLCO 20% CT chest 04/17/16 >> severe centrilobular emphysema  Cardiac tests: Doppler b/l legs 04/06/14 >> DVT Lt femoral, popliteal, posterior tibial, peroneal veins Doppler Lt leg 09/11/14 >> chronic DVT Lt proximal and mid femoral veins, Lt popliteal veins Echo 08/07/16 >> EF 60 to 65%, grade 1 DD, mod AR, mild MR  Sleep tests: HST 06/26/17 >> AHI 0.5, SaO2 low 86%.  Past Medical History: She  has a past medical history of Anal fistula, Anemia in chronic kidney disease (CKD), Aortic insufficiency, Arthritis, Borderline hypertension, Bulging disc, Chronic diarrhea, CKD (chronic kidney disease), stage IV ([place]), Crohn's disease ([place]), Dyspnea, Emphysema/COPD ([place]), GERD (gastroesophageal reflux disease), History of blood transfusion, History of glaucoma, History of kidney stones, History of pancreatitis, History of small bowel obstruction, Hypertension, Hypothyroidism,  postsurgical, Osteoporosis, Perianal Crohn's disease ([place]), Peripheral vascular disease ([place]), Polyarthralgia, and Wears partial dentures.  Past Surgical History: She  has a past surgical history that includes Colon resection (x3 --  1978,  1987, 1989); Esophagogastroduodenoscopy (03/04/2012); Flexible sigmoidoscopy (03/04/2012); Examination under anesthesia (N/A, 09/12/2014); Incision and drainage perirectal abscess (N/A, 09/12/2014); Colonoscopy with propofol (N/A, 09/25/2014); Total thyroidectomy (10-13-2003);  transthoracic echocardiogram (07-11-2013); Glaucoma surgery (Bilateral); NEGATIVE SLEEP STUDY (2008); Abdominal hysterectomy (1990); Rectal exam under anesthesia (N/A, 12/01/2014); Placement of seton (N/A, 12/01/2014); Cystoscopy w/ ureteral stent placement (Right, 11/19/2016); Holmium laser application (Right, 5/88/3254); Bascilic vein transposition (Left, 01/09/2017); IR Fluoro Guide CV Line Right (03/20/2017); IR US Guide Vasc Access Right (98/26/4158); and Bascilic vein transposition (Left, 06/16/2017).  Family History: Her family history includes COPD in her mother; Colon cancer in her brother; Diabetes in her brother; Diverticulitis in her mother; Heart attack in her brother, father, and mother; Heart disease in her father and mother; Hypertension in her father and mother; Thyroid disease in her brother.  Social History: She  reports that she quit smoking about 15 years ago. Her smoking use included cigarettes. She has a 3.75 pack-year smoking history. She has never used smokeless tobacco. She reports that she does not drink alcohol or use drugs.  Medications: Allergies as of 10/20/2017      Reactions   Mercaptopurine Other (See Comments)   Caused pancreatitis   Remicade [infliximab] Other (See Comments)   CAUSED JOINT PAIN      Medication List        Accurate as of 10/20/17 10:33 AM. Always use your most recent med list.          acetaminophen 325 MG tablet Commonly known as:  TYLENOL Take 325 mg by mouth every 6 (six) hours as needed for mild pain.   albuterol 108 (90 Base) MCG/ACT inhaler Commonly known as:  PROVENTIL HFA;VENTOLIN HFA Inhale 2 puffs into the lungs every 6 (six) hours as needed for wheezing or shortness of breath.   alendronate 70 MG tablet Commonly known as:  FOSAMAX Take 70 mg by mouth every Monday.   calcitRIOL 0.25 MCG capsule Commonly known as:  ROCALTROL Take 0.25 mcg by mouth every Monday, Wednesday, and Friday with hemodialysis.   cyanocobalamin 1000  MCG/ML injection Commonly known as:  (VITAMIN B-12) Inject 1,000 mcg into the muscle every 3 (three) months.   Fluticasone-Umeclidin-Vilant 100-62.5-25 MCG/INH Aepb Commonly known as:  TRELEGY ELLIPTA Inhale 1 puff into the lungs daily.   HUMIRA 40 MG/0.8ML Pskt Generic drug:  Adalimumab Inject 40 mg into the skin every 14 (fourteen) days.   levothyroxine 125 MCG tablet Commonly known as:  SYNTHROID, LEVOTHROID Take 125 mcg by mouth daily before breakfast.   loperamide 2 MG capsule Commonly known as:  IMODIUM Take 2 capsules (4 mg total) by mouth as needed for diarrhea or loose stools.   midodrine 5 MG tablet Commonly known as:  PROAMATINE Take 5 mg by mouth See admin instructions. 5 mg PRIOR TO dialysis on Mon/Wed/Fri   nitroGLYCERIN 0.4 MG SL tablet Commonly known as:  NITROSTAT Place 1 tablet (0.4 mg total) under the tongue every 5 (five) minutes as needed for chest pain.   predniSONE 5 MG tablet Commonly known as:  DELTASONE Take 5 mg by mouth daily with breakfast.   sodium bicarbonate 650 MG tablet Take 2 tablets (1,300 mg total) by mouth 2 (two) times daily.

## 2017-10-20 NOTE — Patient Instructions (Signed)
Try using your albuterol before going out in hot weather  Follow up in 6 months

## 2017-10-28 NOTE — Progress Notes (Signed)
Established Dialysis Access   History of Present Illness   Natasha [name] is a 72 y.o. (04-Dec-1945) female who presents for re-evaluation for permanent access.  The patient is right hand dominant.  Previous access procedures have been completed in the left arm.  Patient had staged L brachial vein transposition (final transposition on 06/16/17).  The patient's complication from previous access procedures include: inadequate flow rates.  The patient has never had a previous PPM placed.  The patient has been able to get one needle into the fistula but no the proximal needle.  Recent fistulogram done by Renal suggests this fistula is not usable.  Past Medical History:  Diagnosis Date  . Anal fistula   . Anemia in chronic kidney disease (CKD)   . Aortic insufficiency    Echo 3/18: mod conc LVH, EF 60-65, no RWMA, Gr 1 DD, mod AI, mild MR, normal RVSF, Trivial TR  . Arthritis   . Borderline hypertension   . Bulging disc    L3-L4  . Chronic diarrhea    due to crohn's  . CKD (chronic kidney disease), stage IV ([place])    MWF- [address]  . Crohn's disease ([place])    chronic ileitis  . Dyspnea    with exertion  . Emphysema/COPD ([place])   . GERD (gastroesophageal reflux disease)    denies  . History of blood transfusion   . History of glaucoma   . History of kidney stones   . History of pancreatitis    2008--  mercaptopurine  . History of small bowel obstruction    12-03-2010  due to crohn's ileitis  . Hypertension    no longer on medications  . Hypothyroidism, postsurgical    multinodule w/ hurthle cells  . Osteoporosis   . Perianal Crohn's disease ([place])   . Peripheral vascular disease ([place])    blood clot behind knee left leg  . Polyarthralgia   . Wears partial dentures    upper    Past Surgical History:  Procedure Laterality Date  . ABDOMINAL HYSTERECTOMY  1990   and  Appendectomy  . BASCILIC VEIN TRANSPOSITION Left 01/09/2017   Procedure: LEFT 1ST STAGE BRACHIAL VEIN  TRANSPOSITION;  Surgeon: Conrad Camden-on-Gauley, MD;  Location: [facility];  Service: Vascular;  Laterality: Left;  . BASCILIC VEIN TRANSPOSITION Left 06/16/2017   Procedure: Second Stage BASILIC VEIN TRANSPOSITION  LEFT ARM;  Surgeon: Conrad Boykins, MD;  Location: [facility];  Service: Vascular;  Laterality: Left;  . COLON RESECTION  x3 --  1978,  1987, [place] x2/   [place]  . COLONOSCOPY WITH PROPOFOL N/A 09/25/2014   Procedure: COLONOSCOPY WITH PROPOFOL;  Surgeon: Garlan Fair, MD;  Location: [facility];  Service: Endoscopy;  Laterality: N/A;  . CYSTOSCOPY W/ URETERAL STENT PLACEMENT Right 11/19/2016   Procedure: CYSTOSCOPY WITH RIGHT RETROGRADE PYELOGRAM/ URETEROSCOPY WITH LASER AND RIGHT URETERAL STENT PLACEMENT;  Surgeon: Ardis Hughs, MD;  Location: [facility];  Service: Urology;  Laterality: Right;  . ESOPHAGOGASTRODUODENOSCOPY  03/04/2012   Procedure: ESOPHAGOGASTRODUODENOSCOPY (EGD);  Surgeon: Garlan Fair, MD;  Location: [facility];  Service: Endoscopy;  Laterality: N/A;  . EXAMINATION UNDER ANESTHESIA N/A 09/12/2014   Procedure: EXAM UNDER ANESTHESIA;  Surgeon: Rolm Bookbinder, MD;  Location: [facility];  Service: General;  Laterality: N/A;  . FLEXIBLE SIGMOIDOSCOPY  03/04/2012   Procedure: FLEXIBLE SIGMOIDOSCOPY;  Surgeon: Garlan Fair, MD;  [facility];  Service: Endoscopy;  Laterality: N/A;  . GLAUCOMA SURGERY Bilateral   . HOLMIUM LASER APPLICATION Right 6/96/7893   Procedure: HOLMIUM LASER APPLICATION;  Surgeon: Ardis Hughs, MD;  Location: [facility];  Service: Urology;  Laterality: Right;  . INCISION AND DRAINAGE PERIRECTAL ABSCESS N/A 09/12/2014   Procedure: IRRIGATION AND DEBRIDEMENT PERIRECTAL ABSCESS;  Surgeon: Rolm Bookbinder, MD;  Location: [facility];  Service: General;  Laterality: N/A;  . IR FLUORO GUIDE CV LINE RIGHT  03/20/2017  . IR US GUIDE VASC ACCESS RIGHT  03/20/2017  . NEGATIVE SLEEP STUDY  2008  . PLACEMENT OF SETON N/A  12/01/2014   Procedure: PLACEMENT OF SETON;  Surgeon: [name], MD;  Location: [facility];  Service: General;  Laterality: N/A;  . RECTAL EXAM UNDER ANESTHESIA N/A 12/01/2014   Procedure: RECTAL EXAM UNDER ANESTHESIA;  Surgeon: [name], MD;  Location: [facility];  Service: General;  Laterality: N/A;  . TOTAL THYROIDECTOMY  10-13-2003  . TRANSTHORACIC ECHOCARDIOGRAM  07-11-2013   mild LVH,  ef 55%,  moderate AR,  mild MR and TR,  trivial PR    Social History   Socioeconomic History  . Marital status: Married    Spouse name: Natasha [name]  . Number of children: 3  . Years of education: 65  . Highest education level: Not on file  Occupational History  . Occupation: Retired    Fish farm manager: RETIRED  Social Needs  . Financial resource strain: Not on file  . Food insecurity:    Worry: Not on file    Inability: Not on file  . Transportation needs:    Medical: Not on file    Non-medical: Not on file  Tobacco Use  . Smoking status: Former Smoker    Packs/day: 0.25    Years: 15.00    Pack years: 3.75    Types: Cigarettes    Last attempt to quit: 11/27/2001    Years since quitting: 15.9  . Smokeless tobacco: Never Used  Substance and Sexual Activity  . Alcohol use: No  . Drug use: No  . Sexual activity: Never  Lifestyle  . Physical activity:    Days per week: Not on file    Minutes per session: Not on file  . Stress: Not on file  Relationships  . Social connections:    Talks on phone: Not on file    Gets together: Not on file    Attends religious service: Not on file    Active member of club or organization: Not on file    Attends meetings of clubs or organizations: Not on file    Relationship status: Not on file  . Intimate partner violence:    Fear of current or ex partner: Not on file    Emotionally abused: Not on file    Physically abused: Not on file    Forced sexual activity: Not on file  Other Topics Concern  . Not on file  Social  History Narrative   Patient is married Natasha [name]) and lives at home with her husband.   Patient has three children.   Patient is retired on disability.   Patient drinks two cups of caffeine daily.   Patient is right-handed.          Family History  Problem Relation Age of Onset  . Hypertension Father   . Heart disease Father   . Heart attack Father   . Diverticulitis Mother   . COPD Mother   . Hypertension Mother   .  Heart disease Mother   . Heart attack Mother   . Heart attack Brother   . Colon cancer Brother   . Diabetes Brother   . Thyroid disease Brother     Current Outpatient Medications  Medication Sig Dispense Refill  . acetaminophen (TYLENOL) 325 MG tablet Take 325 mg by mouth every 6 (six) hours as needed for mild pain.    . Adalimumab (HUMIRA) 40 MG/0.8ML PSKT Inject 40 mg into the skin every 14 (fourteen) days.    [name] albuterol (PROVENTIL HFA;VENTOLIN HFA) 108 (90 Base) MCG/ACT inhaler Inhale 2 puffs into the lungs every 6 (six) hours as needed for wheezing or shortness of breath. 1 Inhaler 5  . alendronate (FOSAMAX) 70 MG tablet Take 70 mg by mouth every Monday.   1  . calcitRIOL (ROCALTROL) 0.25 MCG capsule Take 0.25 mcg by mouth every Monday, Wednesday, and Friday with hemodialysis.  0  . cyanocobalamin (,VITAMIN B-12,) 1000 MCG/ML injection Inject 1,000 mcg into the muscle every 3 (three) months.     . Fluticasone-Umeclidin-Vilant (TRELEGY ELLIPTA) 100-62.5-25 MCG/INH AEPB Inhale 1 puff into the lungs daily. 28 each 5  . levothyroxine (SYNTHROID, LEVOTHROID) 125 MCG tablet Take 125 mcg by mouth daily before breakfast.     . loperamide (IMODIUM) 2 MG capsule Take 2 capsules (4 mg total) by mouth as needed for diarrhea or loose stools. 30 capsule 0  . midodrine (PROAMATINE) 5 MG tablet Take 5 mg by mouth See admin instructions. 5 mg PRIOR TO dialysis on Mon/Wed/Fri  0  . predniSONE (DELTASONE) 5 MG tablet Take 5 mg by mouth daily with breakfast.    . sodium bicarbonate  650 MG tablet Take 2 tablets (1,300 mg total) by mouth 2 (two) times daily. (Patient taking differently: Take 1,300 mg by mouth daily. ) 120 tablet 0  . nitroGLYCERIN (NITROSTAT) 0.4 MG SL tablet Place 1 tablet (0.4 mg total) under the tongue every 5 (five) minutes as needed for chest pain. 25 tablet 3   No current facility-administered medications for this visit.      Allergies  Allergen Reactions  . Mercaptopurine Other (See Comments)    Caused pancreatitis  . Remicade [Infliximab] Other (See Comments)    CAUSED JOINT PAIN    REVIEW OF SYSTEMS (negative unless checked):   Cardiac:  []  Chest pain or chest pressure? []  Shortness of breath upon activity? []  Shortness of breath when lying flat? []  Irregular heart rhythm?  Vascular:  []  Pain in calf, thigh, or hip brought on by walking? []  Pain in feet at night that wakes you up from your sleep? []  Blood clot in your veins? []  Leg swelling?  Pulmonary:  []  Oxygen at home? []  Productive cough? []  Wheezing?  Neurologic:  []  Sudden weakness in arms or legs? []  Sudden numbness in arms or legs? []  Sudden onset of difficult speaking or slurred speech? []  Temporary loss of vision in one eye? []  Problems with dizziness?  Gastrointestinal:  []  Blood in stool? []  Vomited blood?  Genitourinary:  []  Burning when urinating? []  Blood in urine? [x]   End stage renal disease-HD: M/W/F  Psychiatric:  []  Major depression  Hematologic:  []  Bleeding problems? []  Problems with blood clotting?  Dermatologic:  []  Rashes or ulcers?  Constitutional:  []  Fever or chills?  Ear/Nose/Throat:  []  Change in hearing? []  Nose bleeds? []  Sore throat?  Musculoskeletal:  []  Back pain? []  Joint pain? []  Muscle pain?   Physical Examination   Vitals:   11/04/17  0855  BP: 140/82  Pulse: 90  Temp: (!) 97.1 F (36.2 C)  SpO2: 98%  Weight: 103 lb (46.7 kg)  Height: 5' 4"  (1.626 m)   Body mass index is 17.68 kg/m.  General  Alert, O x 3, WD, NAD  Pulmonary Sym exp, good B air movt, CTA B  Cardiac RRR, Nl S1, S2, no Murmurs, No rubs, No S3,S4  Vascular Vessel Right Left  Radial Palpable Faintly palpable  Brachial Palpable Palpable  Ulnar Not palpable Not palpable    Musculo- skeletal M/S 5/5 throughout , Extremities without ischemic changes, + bruit (whistling sound), No thrill  Neurologic Pain and light touch intact in extremities, Motor exam as listed above    Medical Decision Making   UGOCHI HENZLER is a 72 y.o. female who presents with ESRD requiring hemodialysis, failing L stage BRVT   Based on vein mapping and examination, this patient's permanent access options include: LUA AVG with Artegraft.  I had an extensive discussion with this patient in regards to the nature of access surgery, including risk, benefits, and alternatives.    The patient is aware that the risks of access surgery include but are not limited to: bleeding, infection, steal syndrome, nerve damage, ischemic monomelic neuropathy, failure of access to mature, and possible need for additional access procedures in the future.  The patient has agreed to proceed with the above procedure which will be scheduled 16 JUL 19.   Adele Barthel, MD, FACS [facility] Office: [phone] Pager: [phone]

## 2017-11-04 ENCOUNTER — Encounter: Payer: Self-pay | Admitting: Vascular Surgery

## 2017-11-04 ENCOUNTER — Ambulatory Visit (INDEPENDENT_AMBULATORY_CARE_PROVIDER_SITE_OTHER): Payer: Medicare Other | Admitting: Vascular Surgery

## 2017-11-04 ENCOUNTER — Other Ambulatory Visit: Payer: Self-pay | Admitting: *Deleted

## 2017-11-04 ENCOUNTER — Encounter: Payer: Self-pay | Admitting: *Deleted

## 2017-11-04 VITALS — BP 140/82 | HR 90 | Temp 97.1°F | Ht 64.0 in | Wt 103.0 lb

## 2017-11-04 DIAGNOSIS — Z992 Dependence on renal dialysis: Secondary | ICD-10-CM | POA: Diagnosis not present

## 2017-11-04 DIAGNOSIS — N186 End stage renal disease: Secondary | ICD-10-CM

## 2017-11-10 ENCOUNTER — Ambulatory Visit: Payer: Medicare Other | Admitting: Vascular Surgery

## 2017-12-07 ENCOUNTER — Other Ambulatory Visit: Payer: Self-pay

## 2017-12-07 ENCOUNTER — Encounter (HOSPITAL_COMMUNITY): Payer: Self-pay | Admitting: *Deleted

## 2017-12-07 NOTE — Progress Notes (Signed)
Pt denies SOB, chest pain, and being under the care of a cardiologist. Pt denies having a cardiac cath but stated that a stress test was performed " 7 years ago." Pt made aware to stop taking vitamins, fish oil and herbal medications. Do not take any NSAIDs ie: Ibuprofen, Advil, Naproxen (Aleve), Motrin, BC and Goody Powder. Pt verbalized understanding of all pre-op instructions.

## 2017-12-08 ENCOUNTER — Ambulatory Visit (HOSPITAL_COMMUNITY): Payer: Medicare Other | Admitting: Anesthesiology

## 2017-12-08 ENCOUNTER — Encounter (HOSPITAL_COMMUNITY): Payer: Self-pay | Admitting: *Deleted

## 2017-12-08 ENCOUNTER — Ambulatory Visit (HOSPITAL_COMMUNITY)
Admission: RE | Admit: 2017-12-08 | Discharge: 2017-12-08 | Disposition: A | Discharge status: Home / Self Care | Payer: Medicare Other | Source: Ambulatory Visit | Attending: Vascular Surgery | Admitting: Vascular Surgery

## 2017-12-08 ENCOUNTER — Encounter (HOSPITAL_COMMUNITY)
Admission: RE | Disposition: A | Discharge status: Home / Self Care | Payer: Self-pay | Source: Ambulatory Visit | Attending: Vascular Surgery

## 2017-12-08 ENCOUNTER — Telehealth: Payer: Self-pay | Admitting: Vascular Surgery

## 2017-12-08 DIAGNOSIS — I129 Hypertensive chronic kidney disease with stage 1 through stage 4 chronic kidney disease, or unspecified chronic kidney disease: Secondary | ICD-10-CM | POA: Diagnosis not present

## 2017-12-08 DIAGNOSIS — Z960 Presence of urogenital implants: Secondary | ICD-10-CM | POA: Diagnosis not present

## 2017-12-08 DIAGNOSIS — I739 Peripheral vascular disease, unspecified: Secondary | ICD-10-CM | POA: Insufficient documentation

## 2017-12-08 DIAGNOSIS — K5090 Crohn's disease, unspecified, without complications: Secondary | ICD-10-CM | POA: Insufficient documentation

## 2017-12-08 DIAGNOSIS — N185 Chronic kidney disease, stage 5: Secondary | ICD-10-CM | POA: Diagnosis not present

## 2017-12-08 DIAGNOSIS — K219 Gastro-esophageal reflux disease without esophagitis: Secondary | ICD-10-CM | POA: Insufficient documentation

## 2017-12-08 DIAGNOSIS — Z836 Family history of other diseases of the respiratory system: Secondary | ICD-10-CM | POA: Diagnosis not present

## 2017-12-08 DIAGNOSIS — Z90710 Acquired absence of both cervix and uterus: Secondary | ICD-10-CM | POA: Diagnosis not present

## 2017-12-08 DIAGNOSIS — I061 Rheumatic aortic insufficiency: Secondary | ICD-10-CM | POA: Diagnosis not present

## 2017-12-08 DIAGNOSIS — Z888 Allergy status to other drugs, medicaments and biological substances status: Secondary | ICD-10-CM | POA: Insufficient documentation

## 2017-12-08 DIAGNOSIS — Z9889 Other specified postprocedural states: Secondary | ICD-10-CM | POA: Insufficient documentation

## 2017-12-08 DIAGNOSIS — D631 Anemia in chronic kidney disease: Secondary | ICD-10-CM | POA: Insufficient documentation

## 2017-12-08 DIAGNOSIS — Z9049 Acquired absence of other specified parts of digestive tract: Secondary | ICD-10-CM | POA: Diagnosis not present

## 2017-12-08 DIAGNOSIS — J439 Emphysema, unspecified: Secondary | ICD-10-CM | POA: Insufficient documentation

## 2017-12-08 DIAGNOSIS — N184 Chronic kidney disease, stage 4 (severe): Secondary | ICD-10-CM | POA: Diagnosis present

## 2017-12-08 DIAGNOSIS — E890 Postprocedural hypothyroidism: Secondary | ICD-10-CM | POA: Diagnosis not present

## 2017-12-08 DIAGNOSIS — M1990 Unspecified osteoarthritis, unspecified site: Secondary | ICD-10-CM | POA: Insufficient documentation

## 2017-12-08 DIAGNOSIS — Z87891 Personal history of nicotine dependence: Secondary | ICD-10-CM | POA: Insufficient documentation

## 2017-12-08 DIAGNOSIS — Z8349 Family history of other endocrine, nutritional and metabolic diseases: Secondary | ICD-10-CM | POA: Insufficient documentation

## 2017-12-08 DIAGNOSIS — Z8249 Family history of ischemic heart disease and other diseases of the circulatory system: Secondary | ICD-10-CM | POA: Diagnosis not present

## 2017-12-08 DIAGNOSIS — Z992 Dependence on renal dialysis: Secondary | ICD-10-CM | POA: Insufficient documentation

## 2017-12-08 HISTORY — DX: Unspecified convulsions: R56.9

## 2017-12-08 HISTORY — PX: AV FISTULA PLACEMENT: SHX1204

## 2017-12-08 LAB — POCT I-STAT 4, (NA,K, GLUC, HGB,HCT)
GLUCOSE: 71 mg/dL (ref 70–99)
HEMATOCRIT: 34 % — AB (ref 36.0–46.0)
HEMOGLOBIN: 11.6 g/dL — AB (ref 12.0–15.0)
Potassium: 3.4 mmol/L — ABNORMAL LOW (ref 3.5–5.1)
Sodium: 136 mmol/L (ref 135–145)

## 2017-12-08 SURGERY — INSERTION OF ARTERIOVENOUS (AV) GORE-TEX GRAFT ARM
Anesthesia: General | Site: Arm Upper | Laterality: Left

## 2017-12-08 MED ORDER — SODIUM CHLORIDE 0.9 % IV SOLN
INTRAVENOUS | Status: DC | PRN
  Administered 2017-12-08: 10:00:00

## 2017-12-08 MED ORDER — HEMOSTATIC AGENTS (NO CHARGE) OPTIME
TOPICAL | Status: DC | PRN
  Administered 2017-12-08: 1 via TOPICAL

## 2017-12-08 MED ORDER — CHLORHEXIDINE GLUCONATE 4 % EX LIQD: 60.0000 mL | Freq: Once | CUTANEOUS | Status: DC

## 2017-12-08 MED ORDER — 0.9 % SODIUM CHLORIDE (POUR BTL) OPTIME
TOPICAL | Status: DC | PRN
  Administered 2017-12-08: 1000 mL

## 2017-12-08 MED ORDER — HYDROCODONE-ACETAMINOPHEN 5-325 MG PO TABS: 1.0000 | ORAL_TABLET | Freq: Four times a day (QID) | ORAL | 0 refills | Status: DC | PRN

## 2017-12-08 MED ORDER — SODIUM CHLORIDE 0.9 % IV SOLN
INTRAVENOUS | Status: DC
  Administered 2017-12-08 (×2): via INTRAVENOUS

## 2017-12-08 MED ORDER — PROTAMINE SULFATE 10 MG/ML IV SOLN
INTRAVENOUS | Status: DC | PRN
  Administered 2017-12-08: 25 mg via INTRAVENOUS

## 2017-12-08 MED ORDER — METOCLOPRAMIDE HCL 5 MG/ML IJ SOLN: 10.0000 mg | Freq: Once | INTRAMUSCULAR | Status: DC | PRN

## 2017-12-08 MED ORDER — PROPOFOL 10 MG/ML IV BOLUS
INTRAVENOUS | Status: DC | PRN
  Administered 2017-12-08: 20 mg via INTRAVENOUS

## 2017-12-08 MED ORDER — LIDOCAINE HCL (PF) 1 % IJ SOLN
INTRAMUSCULAR | Status: AC
  Filled 2017-12-08: qty 30

## 2017-12-08 MED ORDER — SODIUM CHLORIDE 0.9 % IV SOLN
INTRAVENOUS | Status: DC | PRN
  Administered 2017-12-08: 15 ug/min via INTRAVENOUS

## 2017-12-08 MED ORDER — PROPOFOL 500 MG/50ML IV EMUL
INTRAVENOUS | Status: DC | PRN
  Administered 2017-12-08: 100 ug/kg/min via INTRAVENOUS

## 2017-12-08 MED ORDER — FENTANYL CITRATE (PF) 100 MCG/2ML IJ SOLN
INTRAMUSCULAR | Status: AC
  Administered 2017-12-08: 25 ug via INTRAVENOUS
  Filled 2017-12-08: qty 2

## 2017-12-08 MED ORDER — FENTANYL CITRATE (PF) 250 MCG/5ML IJ SOLN
INTRAMUSCULAR | Status: AC
  Filled 2017-12-08: qty 5

## 2017-12-08 MED ORDER — HEPARIN SODIUM (PORCINE) 1000 UNIT/ML IJ SOLN
INTRAMUSCULAR | Status: DC | PRN
  Administered 2017-12-08: 5000 [IU] via INTRAVENOUS

## 2017-12-08 MED ORDER — FENTANYL CITRATE (PF) 100 MCG/2ML IJ SOLN
INTRAMUSCULAR | Status: DC | PRN
  Administered 2017-12-08: 50 ug via INTRAVENOUS
  Administered 2017-12-08 (×3): 25 ug via INTRAVENOUS

## 2017-12-08 MED ORDER — HEPARIN SODIUM (PORCINE) 5000 UNIT/ML IJ SOLN
INTRAMUSCULAR | Status: AC
  Filled 2017-12-08 (×2): qty 1.2

## 2017-12-08 MED ORDER — LIDOCAINE HCL (PF) 1 % IJ SOLN
INTRAMUSCULAR | Status: DC | PRN
  Administered 2017-12-08: 28 mL

## 2017-12-08 MED ORDER — ONDANSETRON HCL 4 MG/2ML IJ SOLN
INTRAMUSCULAR | Status: DC | PRN
  Administered 2017-12-08: 4 mg via INTRAVENOUS

## 2017-12-08 MED ORDER — FENTANYL CITRATE (PF) 100 MCG/2ML IJ SOLN
25.0000 ug | INTRAMUSCULAR | Status: DC | PRN
  Administered 2017-12-08 (×4): 25 ug via INTRAVENOUS

## 2017-12-08 MED ORDER — CEFAZOLIN SODIUM-DEXTROSE 2-4 GM/100ML-% IV SOLN
2.0000 g | INTRAVENOUS | Status: AC
  Administered 2017-12-08: 2 g via INTRAVENOUS
  Filled 2017-12-08: qty 100

## 2017-12-08 SURGICAL SUPPLY — 41 items
ADH SKN CLS APL DERMABOND .7 (GAUZE/BANDAGES/DRESSINGS) ×1
AGENT HMST SPONGE THK3/8 (HEMOSTASIS) ×1
ARMBAND PINK RESTRICT EXTREMIT (MISCELLANEOUS) ×6 IMPLANT
CANISTER SUCT 3000ML PPV (MISCELLANEOUS) ×3 IMPLANT
CLIP VESOCCLUDE MED 6/CT (CLIP) ×3 IMPLANT
CLIP VESOCCLUDE SM WIDE 6/CT (CLIP) ×3 IMPLANT
COVER PROBE W GEL 5X96 (DRAPES) ×3 IMPLANT
DECANTER SPIKE VIAL GLASS SM (MISCELLANEOUS) ×3 IMPLANT
DERMABOND ADVANCED (GAUZE/BANDAGES/DRESSINGS) ×2
DERMABOND ADVANCED .7 DNX12 (GAUZE/BANDAGES/DRESSINGS) ×1 IMPLANT
ELECT REM PT RETURN 9FT ADLT (ELECTROSURGICAL) ×3
ELECTRODE REM PT RTRN 9FT ADLT (ELECTROSURGICAL) ×1 IMPLANT
GLOVE BIO SURGEON STRL SZ7 (GLOVE) ×3 IMPLANT
GLOVE BIO SURGEON STRL SZ7.5 (GLOVE) ×2 IMPLANT
GLOVE BIOGEL PI IND STRL 7.0 (GLOVE) IMPLANT
GLOVE BIOGEL PI IND STRL 7.5 (GLOVE) ×1 IMPLANT
GLOVE BIOGEL PI INDICATOR 7.0 (GLOVE) ×2
GLOVE BIOGEL PI INDICATOR 7.5 (GLOVE) ×2
GLOVE ECLIPSE 6.5 STRL STRAW (GLOVE) ×6 IMPLANT
GLOVE SURG SS PI 7.0 STRL IVOR (GLOVE) ×6 IMPLANT
GOWN STRL REUS W/ TWL LRG LVL3 (GOWN DISPOSABLE) ×3 IMPLANT
GOWN STRL REUS W/TWL LRG LVL3 (GOWN DISPOSABLE) ×9
GRAFT COLLAGEN VASCULAR 7X40 (Vascular Products) ×2 IMPLANT
HEMOSTAT SPONGE AVITENE ULTRA (HEMOSTASIS) ×2 IMPLANT
KIT BASIN OR (CUSTOM PROCEDURE TRAY) ×3 IMPLANT
KIT TURNOVER KIT B (KITS) ×3 IMPLANT
NS IRRIG 1000ML POUR BTL (IV SOLUTION) ×3 IMPLANT
PACK CV ACCESS (CUSTOM PROCEDURE TRAY) ×3 IMPLANT
PAD ARMBOARD 7.5X6 YLW CONV (MISCELLANEOUS) ×6 IMPLANT
SUT GORETEX 6.0 TT13 (SUTURE) IMPLANT
SUT MNCRL AB 4-0 PS2 18 (SUTURE) ×5 IMPLANT
SUT PROLENE 6 0 BV (SUTURE) ×5 IMPLANT
SUT PROLENE 7 0 BV 1 (SUTURE) IMPLANT
SUT SILK 2 0 PERMA HAND 18 BK (SUTURE) ×6 IMPLANT
SUT VIC AB 3-0 SH 27 (SUTURE) ×6
SUT VIC AB 3-0 SH 27X BRD (SUTURE) ×2 IMPLANT
SUT VIC AB 4-0 PS2 18 (SUTURE) ×2 IMPLANT
SYR TOOMEY 50ML (SYRINGE) IMPLANT
TOWEL GREEN STERILE (TOWEL DISPOSABLE) ×3 IMPLANT
UNDERPAD 30X30 (UNDERPADS AND DIAPERS) ×3 IMPLANT
WATER STERILE IRR 1000ML POUR (IV SOLUTION) ×3 IMPLANT

## 2017-12-08 NOTE — Anesthesia Preprocedure Evaluation (Addendum)
Anesthesia Evaluation  Patient identified by MRN, date of birth, ID band Patient awake    Reviewed: Allergy & Precautions, NPO status , Patient's Chart, lab work & pertinent test results  Airway Mallampati: II  TM Distance: >3 FB Neck ROM: Full    Dental no notable dental hx. (+) Partial Upper   Pulmonary COPD, former smoker,    Pulmonary exam normal breath sounds clear to auscultation       Cardiovascular hypertension, Normal cardiovascular exam Rhythm:Regular Rate:Normal     Neuro/Psych negative neurological ROS  negative psych ROS   GI/Hepatic negative GI ROS, Neg liver ROS, neg GERD  ,  Endo/Other  Hypothyroidism   Renal/GU ESRF and DialysisRenal disease  negative genitourinary   Musculoskeletal negative musculoskeletal ROS (+)   Abdominal   Peds negative pediatric ROS (+)  Hematology negative hematology ROS (+)   Anesthesia Other Findings   Reproductive/Obstetrics negative OB ROS                             Anesthesia Physical Anesthesia Plan  ASA: IV  Anesthesia Plan: General and MAC   Post-op Pain Management:    Induction: Intravenous  PONV Risk Score and Plan: 3 and Ondansetron and Treatment may vary due to age or medical condition  Airway Management Planned: LMA and Simple Face Mask  Additional Equipment:   Intra-op Plan:   Post-operative Plan:   Informed Consent: I have reviewed the patients History and Physical, chart, labs and discussed the procedure including the risks, benefits and alternatives for the proposed anesthesia with the patient or authorized representative who has indicated his/her understanding and acceptance.   Dental advisory given  Plan Discussed with: CRNA  Anesthesia Plan Comments:        Anesthesia Quick Evaluation

## 2017-12-08 NOTE — Telephone Encounter (Signed)
sch appt spk to pt 01/05/18 1pm p/o PA

## 2017-12-08 NOTE — Anesthesia Postprocedure Evaluation (Signed)
Anesthesia Post Note  Patient: Natasha [name]  Procedure(s) Performed: INSERTION OF ARTERIOVENOUS (AV) GRAFT WITH ARTEGRAFT TO LEFT UPPER ARM (Left Arm Upper)     Patient location during evaluation: PACU Anesthesia Type: General Level of consciousness: awake and alert Pain management: pain level controlled Vital Signs Assessment: post-procedure vital signs reviewed and stable Respiratory status: spontaneous breathing, nonlabored ventilation, respiratory function stable and patient connected to nasal cannula oxygen Cardiovascular status: blood pressure returned to baseline and stable Postop Assessment: no apparent nausea or vomiting Anesthetic complications: no    Last Vitals:  Vitals:   12/08/17 1240 12/08/17 1255  BP: 107/61 111/63  Pulse: 70 95  Resp: 18 15  Temp:    SpO2: 92% 94%    Last Pain:  Vitals:   12/08/17 1240  TempSrc:   PainSc: 7                  Montez Hageman

## 2017-12-08 NOTE — H&P (Signed)
Brief History and Physical  History of Present Illness   Natasha [name] is a 72 y.o. female who presents with chief complaint: ESRD.  The patient presents today for placement of LUA AVG with Artegraft.    Past Medical History:  Diagnosis Date  . Anal fistula   . Anemia in chronic kidney disease (CKD)   . Aortic insufficiency    Echo 3/18: mod conc LVH, EF 60-65, no RWMA, Gr 1 DD, mod AI, mild MR, normal RVSF, Trivial TR  . Arthritis   . Borderline hypertension   . Bulging disc    L3-L4  . Chronic diarrhea    due to crohn's  . CKD (chronic kidney disease), stage IV ([place])    MWF- [address]  . Crohn's disease ([place])    chronic ileitis  . Dyspnea    with exertion  . Emphysema/COPD ([place])   . GERD (gastroesophageal reflux disease)    denies  . History of blood transfusion   . History of glaucoma   . History of kidney stones   . History of pancreatitis    2008--  mercaptopurine  . History of small bowel obstruction    12-03-2010  due to crohn's ileitis  . Hypertension    no longer on medications  . Hypothyroidism, postsurgical    multinodule w/ hurthle cells  . Osteoporosis   . Perianal Crohn's disease ([place])   . Peripheral vascular disease ([place])    blood clot behind knee left leg  . Polyarthralgia   . Seizures ([place])    03/2017  . Wears partial dentures    upper    Past Surgical History:  Procedure Laterality Date  . ABDOMINAL HYSTERECTOMY  1990   and  Appendectomy  . BASCILIC VEIN TRANSPOSITION Left 01/09/2017   Procedure: LEFT 1ST STAGE BRACHIAL VEIN TRANSPOSITION;  Surgeon: Conrad [place], MD;  Location: [facility];  Service: Vascular;  Laterality: Left;  . BASCILIC VEIN TRANSPOSITION Left 06/16/2017   Procedure: Second Stage BASILIC VEIN TRANSPOSITION  LEFT ARM;  Surgeon: Conrad Alton, MD;  Location: [facility];  Service: Vascular;  Laterality: Left;  . CHOLECYSTECTOMY    . COLON RESECTION  x3 --  1978,  1987, 1989   ILEAL RESECTION x2/   [place]  .  COLONOSCOPY WITH PROPOFOL N/A 09/25/2014   Procedure: COLONOSCOPY WITH PROPOFOL;  Surgeon: Garlan Fair, MD;  [facility]: [facility];  Service: Endoscopy;  Laterality: N/A;  . CYSTOSCOPY W/ URETERAL STENT PLACEMENT Right 11/19/2016   Procedure: CYSTOSCOPY WITH RIGHT RETROGRADE PYELOGRAM/ URETEROSCOPY WITH LASER AND RIGHT URETERAL STENT PLACEMENT;  Surgeon: Ardis Hughs, MD;  [facility]: [facility];  Service: Urology;  Laterality: Right;  . ESOPHAGOGASTRODUODENOSCOPY  03/04/2012   Procedure: ESOPHAGOGASTRODUODENOSCOPY (EGD);  Surgeon: Garlan Fair, MD;  Location: [facility];  Service: Endoscopy;  Laterality: N/A;  . EXAMINATION UNDER ANESTHESIA N/A 09/12/2014   Procedure: EXAM UNDER ANESTHESIA;  Surgeon: Rolm Bookbinder, MD;  Location: [facility];  Service: General;  Laterality: N/A;  . EYE SURGERY     laser  . FLEXIBLE SIGMOIDOSCOPY  03/04/2012   Procedure: FLEXIBLE SIGMOIDOSCOPY;  Surgeon: Garlan Fair, MD;  [facility];  Service: Endoscopy;  Laterality: N/A;  . GLAUCOMA SURGERY Bilateral   . HOLMIUM LASER APPLICATION Right 06/17/4823   Procedure: HOLMIUM LASER APPLICATION;  Surgeon: Ardis Hughs, MD;  [facility];  Service: Urology;  Laterality: Right;  . INCISION AND DRAINAGE PERIRECTAL ABSCESS N/A 09/12/2014  Procedure: IRRIGATION AND DEBRIDEMENT PERIRECTAL ABSCESS;  Surgeon: Rolm Bookbinder, MD;  Location: [place] [facility];  Service: General;  Laterality: N/A;  . IR FLUORO GUIDE CV LINE RIGHT  03/20/2017  . IR US GUIDE VASC ACCESS RIGHT  03/20/2017  . NEGATIVE SLEEP STUDY  2008  . PLACEMENT OF SETON N/A 12/01/2014   Procedure: PLACEMENT OF SETON;  Surgeon: [name], MD;  Location: [facility];  Service: General;  Laterality: N/A;  . RECTAL EXAM UNDER ANESTHESIA N/A 12/01/2014   Procedure: RECTAL EXAM UNDER ANESTHESIA;  Surgeon: [name], MD;  Location: [facility];  Service: General;  Laterality: N/A;  . TOTAL THYROIDECTOMY   10-13-2003  . TRANSTHORACIC ECHOCARDIOGRAM  07-11-2013   mild LVH,  ef 55%,  moderate AR,  mild MR and TR,  trivial PR  . TUBAL LIGATION      Social History   Socioeconomic History  . Marital status: Married    Spouse name: [name]  . Number of children: 3  . Years of education: 37  . Highest education level: Not on file  Occupational History  . Occupation: Retired    Fish farm manager: RETIRED  Social Needs  . Financial resource strain: Not on file  . Food insecurity:    Worry: Not on file    Inability: Not on file  . Transportation needs:    Medical: Not on file    Non-medical: Not on file  Tobacco Use  . Smoking status: Former Smoker    Packs/day: 0.25    Years: 15.00    Pack years: 3.75    Types: Cigarettes    Last attempt to quit: 11/27/2001    Years since quitting: 16.0  . Smokeless tobacco: Never Used  Substance and Sexual Activity  . Alcohol use: No  . Drug use: No  . Sexual activity: Never  Lifestyle  . Physical activity:    Days per week: Not on file    Minutes per session: Not on file  . Stress: Not on file  Relationships  . Social connections:    Talks on phone: Not on file    Gets together: Not on file    Attends religious service: Not on file    Active member of club or organization: Not on file    Attends meetings of clubs or organizations: Not on file    Relationship status: Not on file  . Intimate partner violence:    Fear of current or ex partner: Not on file    Emotionally abused: Not on file    Physically abused: Not on file    Forced sexual activity: Not on file  Other Topics Concern  . Not on file  Social History Narrative   Patient is married [name]) and lives at home with her husband.   Patient has three children.   Patient is retired on disability.   Patient drinks two cups of caffeine daily.   Patient is right-handed.          Family History  Problem Relation Age of Onset  . Hypertension Father   . Heart disease Father   . Heart  attack Father   . Diverticulitis Mother   . COPD Mother   . Hypertension Mother   . Heart disease Mother   . Heart attack Mother   . Heart attack Brother   . Colon cancer Brother   . Diabetes Brother   . Thyroid disease Brother     Current Facility-Administered Medications  Medication Dose  Route Frequency Provider Last Rate Last Dose  . 0.9 %  sodium chloride infusion   Intravenous Continuous Conrad Pioneer, MD      . ceFAZolin (ANCEF) IVPB 2g/100 mL premix  2 g Intravenous 30 min Pre-Op Conrad Malakoff, MD      . chlorhexidine (HIBICLENS) 4 % liquid 4 application  60 mL Topical Once Conrad [place]-Eddy, MD       And  . Derrill Memo ON 12/09/2017] chlorhexidine (HIBICLENS) 4 % liquid 4 application  60 mL Topical Once Conrad Sawgrass, MD        Allergies  Allergen Reactions  . Mercaptopurine Other (See Comments)    Caused pancreatitis  . Remicade [Infliximab] Other (See Comments)    CAUSED JOINT PAIN    Review of Systems: As listed above, otherwise negative.   Physical Examination   Vitals:   12/08/17 0618  BP: 126/69  Pulse: 85  Resp: 18  Temp: 97.8 F (36.6 C)  TempSrc: Oral  SpO2: 100%  Weight: 107 lb (48.5 kg)  Height: 5' 4"  (1.626 m)   Body mass index is 18.37 kg/m.  General Alert, O x 3, WD, NAD  Pulmonary Sym exp, good B air movt, CTA B  Cardiac RRR, Nl S1, S2, no Murmurs, No rubs, No S3,S4  Musculo- skeletal Motor intact, radial faintly palpable, incision healed, no thrill, no bruit   Neurologic Pain and light touch intact in extremities,     Laboratory  See iStat   Medical Decision Making   Natasha [name] is a 72 y.o. female who presents with: failing L BRVT.   The patient is scheduled for: LUA AVG with Artegraft   Risk, benefits, and alternatives to access surgery were discussed.  The patient is aware the risks include but are not limited to: bleeding, infection, steal syndrome, nerve damage, ischemic monomelic neuropathy, failure to mature, and need for  additional procedures.  The patient is aware of the risks and agrees to proceed.   Adele Barthel, MD, FACS [facility] [place] Office: [phone] Pager: [phone]  12/08/2017, 6:56 AM

## 2017-12-08 NOTE — Discharge Instructions (Signed)
° °  [facility] [place] ° °Discharge Instructions ° °AV Fistula or Graft Surgery for Dialysis Access ° °Please refer to the following instructions for your post-procedure care. Your surgeon or physician assistant will discuss any changes with you. ° °Activity ° °You may drive the day following your surgery, if you are comfortable and no longer taking prescription pain medication. Resume full activity as the soreness in your incision resolves. ° °Bathing/Showering ° °You may shower after you go home. Keep your incision dry for 48 hours. Do not soak in a bathtub, hot tub, or swim until the incision heals completely. You may not shower if you have a hemodialysis catheter. ° °Incision Care ° °Clean your incision with mild soap and water after 48 hours. Pat the area dry with a clean towel. You do not need a bandage unless otherwise instructed. Do not apply any ointments or creams to your incision. You may have skin glue on your incision. Do not peel it off. It will come off on its own in about one week. Your arm may swell a bit after surgery. To reduce swelling use pillows to elevate your arm so it is above your heart. Your doctor will tell you if you need to lightly wrap your arm with an ACE bandage. ° °Diet ° °Resume your normal diet. There are not special food restrictions following this procedure. In order to heal from your surgery, it is CRITICAL to get adequate nutrition. Your body requires vitamins, minerals, and protein. Vegetables are the best source of vitamins and minerals. Vegetables also provide the perfect balance of protein. Processed food has little nutritional value, so try to avoid this. ° °Medications ° °Resume taking all of your medications. If your incision is causing pain, you may take over-the counter pain relievers such as acetaminophen (Tylenol). If you were prescribed a stronger pain medication, please be aware these medications can cause nausea and constipation. Prevent  nausea by taking the medication with a snack or meal. Avoid constipation by drinking plenty of fluids and eating foods with high amount of fiber, such as fruits, vegetables, and grains. Do not take Tylenol if you are taking prescription pain medications. ° ° ° ° °Follow up °Your surgeon may want to see you in the office following your access surgery. If so, this will be arranged at the time of your surgery. ° °Please call us immediately for any of the following conditions: ° °Increased pain, redness, drainage (pus) from your incision site °Fever of 101 degrees or higher °Severe or worsening pain at your incision site °Hand pain or numbness. ° °Reduce your risk of vascular disease: ° °Stop smoking. If you would like help, call [facility] at 1-800-QUIT-NOW (1-800-784-8669) or [place] at 336-586-4000 ° °Manage your cholesterol °Maintain a desired weight °Control your diabetes °Keep your blood pressure down ° °Dialysis ° °It will take several weeks to several months for your new dialysis access to be ready for use. Your surgeon will determine when it is OK to use it. Your nephrologist will continue to direct your dialysis. You can continue to use your Permcath until your new access is ready for use. ° °If you have any questions, please call the office at [phone]. ° °

## 2017-12-08 NOTE — Op Note (Addendum)
OPERATIVE NOTE   PROCEDURE: 1. Left upper arm looped arteriovenous graft with Artegraft (axillobasilic)  PRE-OPERATIVE DIAGNOSIS: end stage renal disease   POST-OPERATIVE DIAGNOSIS: same as above   SURGEON: Adele Barthel, MD  ASSISTANT(S): Dagoberto Ligas, PAC   ANESTHESIA: local and MAC  ESTIMATED BLOOD LOSS: 100 cc  FINDING(S): 1.  Densely scarred tissue surround antecubital segment of brachial artery 2.  Soft axillary artery: 4 mm, limited plaque in lumen 3.  Axillary vein: 5 mm 4.  Palpable thrill and left radial pulse  SPECIMEN(S):  none  INDICATIONS:   Natasha [name] is a 72 y.o. female who presents with end stage renal disease and failed left second stage brachial vein transposition.  I recommended placement of left upper arm arteriovenous graft with Artegraft.  Risk, benefits, and alternatives to access surgery were discussed.  The patient is aware the risks include but are not limited to: bleeding, infection, steal syndrome, nerve damage, ischemic monomelic neuropathy, thrombosis, failure to mature, complications related to venous hypertension, need for additional procedures, death and stroke.  The patient agrees to proceed forward with the procedure.   DESCRIPTION: After obtaining full informed written consent, the patient was brought back to the operating room and placed supine upon the operating table.  The patient received IV antibiotics prior to induction.  A procedure time out was completed and the correct surgical site was verified.  After obtaining adequate anesthesia, the patient was prepped and draped in the standard fashion for: left arm access procedure.    Under [place], I identified the brachial artery at the antecubitum and also the largest vein the axilla, which appeared to be a basilic vein.  I injected 10 cc of 1% lidocaine without epinephrine at the arterial and venous exposure.  I made an incision over the brachial artery.  Immediately, it became  evident that there was severe scarring.  I could not safely identify the brachial artery due to the scar tissue.  I elected to use this incision as the apex incision for a looped arteriovenous graft.  I made an incision in the axilla and then first dissected out the basilic vein leading into a large soft axillary vein, 5 mm.  I then dissected out an axillary artery in the axilla.  This artery was 4 mm and relatively soft.  I then bluntly start the subcutaneous tunnel in the axillary and apical incision.  I injected another 20 cc of 1% lidocaine without epinephrine along the anticipated route of the upper arm arteriovenous graft.  I used a curvilinear dissector to route a 6 mm Artegraft that had been prepared per manufacturer recommended technique through the subcutaneous tissue in an upper arm loop fashion.  The graft had been marked to maintain orientation and the graft delivered by tying the graft to the inner cannula.  The graft was delivered through the metal tunnel, taking care to maintain orientation.  The metal tunnel was removed, leaving the graft in place.    I gave 5000 units of Heparin as anticoagulation.  After three minutes, I placed the axillary artery under tension proximally and distally.  I made an arteriotomy and extended it proximally and distally for a 4 mm arteriotomy.  There was evidence of a plaque in the lateral wall.  I sewed the Artegraft to the arteriotomy with a running stitch of 6-0 Prolene in an end-to-side configuration, taking care to tack down the plaque in the artery.  I released the vessel loops proximally and distally.  Immediately, there was strong pulse in the graft, which I had clamped at the apical incision.    I then pulled the graft to tension.  I then tied off the basilic vein distally and the clamped the axillary vein proximal to a valve segment.  I then transected the vein and spatulated the superior wall of the vein.  There was extensive valves here complicating  the anastomosis, so I moved the clamp more proximally on the axillary vein and transected the valve segment.  I again spatulated the vein.  The graft was spatulated to facilitate this end to end anastomosis, in the process the graft was shortened.  I sewed the graft to the vein with a running stitch of 6-0 Prolene.  Prior to completing this anastomosis, I backbled the vein, which no clot and vigorous backbleeding.  I then bled the graft and pulsatile bleeding was present.  I completed this anastomosis in the usual fashion.  I packed Avitene into all incision and gave 25 mg of Protamine to reverse anticoagulation.  I expressed all blood from the subcutaneous tunnel.  I removed all Avitene and no further bleeding was present.  The subcutaneous tissue was reapproximated with 3-0 Vicryl in a running fashion.  The skin was reapproximated with a running subcuticular stitch of 4-0 Monocryl.  The skin was cleaned, dried, and reinforced with Dermabond.   At the end of the case, there was a good thrill in the graft with a palpable radial pulse.   COMPLICATIONS: none  CONDITION: stable   Adele Barthel, MD, [facility] [place] Office: [phone] Pager: [phone]  12/08/2017, 11:25 AM

## 2017-12-08 NOTE — Transfer of Care (Signed)
Immediate Anesthesia Transfer of Care Note  Patient: Natasha [name]  Procedure(s) Performed: INSERTION OF ARTERIOVENOUS (AV) GRAFT WITH ARTEGRAFT TO LEFT UPPER ARM (Left Arm Upper)  Patient Location: PACU  Anesthesia Type:MAC  Level of Consciousness: awake, alert  and oriented  Airway & Oxygen Therapy: Patient Spontanous Breathing and Patient connected to nasal cannula oxygen  Post-op Assessment: Report given to RN, Post -op Vital signs reviewed and stable and Patient moving all extremities X 4  Post vital signs: Reviewed and stable  Last Vitals:  Vitals Value Taken Time  BP    Temp    Pulse    Resp    SpO2      Last Pain:  Vitals:   12/08/17 0633  TempSrc:   PainSc: 0-No pain      Patients Stated Pain Goal: 3 (06/25/41 8887)  Complications: No apparent anesthesia complications

## 2017-12-09 ENCOUNTER — Telehealth: Payer: Self-pay | Admitting: *Deleted

## 2017-12-09 ENCOUNTER — Encounter (HOSPITAL_COMMUNITY): Payer: Self-pay | Admitting: Vascular Surgery

## 2017-12-09 NOTE — Telephone Encounter (Signed)
Second E-mail sent to [employer] with contact info @[facility] to make contact with charge nurse regarding cannulation of ARTEGRAFT. Spoke with Camera operator and gave her contact e-mail for [employer].

## 2018-01-05 ENCOUNTER — Ambulatory Visit (INDEPENDENT_AMBULATORY_CARE_PROVIDER_SITE_OTHER): Payer: Self-pay | Admitting: Physician Assistant

## 2018-01-05 VITALS — BP 127/78 | Temp 97.9°F | Resp 18 | Ht 64.0 in | Wt 101.0 lb

## 2018-01-05 DIAGNOSIS — N186 End stage renal disease: Secondary | ICD-10-CM

## 2018-01-05 DIAGNOSIS — Z992 Dependence on renal dialysis: Secondary | ICD-10-CM

## 2018-01-05 NOTE — Progress Notes (Signed)
  POST OPERATIVE OFFICE NOTE    CC:  F/u for surgery  HPI:  This is a 72 y.o. female who is s/p Left upper arm looped arteriovenous graft with Artegraft (axillobasilic)  7/ 33/4356.  She is here for a post op visit.  She denise pain, loss of sensation or loss of motor in the left UE.  She densie changes in her medical history since her last procedure.  No complaints of N/V, fever or chills.  She is on HD via right [facility] [id number].  Allergies  Allergen Reactions  . Mercaptopurine Other (See Comments)    Caused pancreatitis  . Remicade [Infliximab] Other (See Comments)    CAUSED JOINT PAIN    Current Outpatient Medications  Medication Sig Dispense Refill  . acetaminophen (TYLENOL) 500 MG tablet Take 500 mg by mouth every 6 (six) hours as needed for mild pain.     . Adalimumab (HUMIRA) 40 MG/0.8ML PSKT Inject 40 mg into the skin every 14 (fourteen) days.    [name] albuterol (PROVENTIL HFA;VENTOLIN HFA) 108 (90 Base) MCG/ACT inhaler Inhale 2 puffs into the lungs every 6 (six) hours as needed for wheezing or shortness of breath. 1 Inhaler 5  . alendronate (FOSAMAX) 70 MG tablet Take 70 mg by mouth every Monday.   1  . calcitRIOL (ROCALTROL) 0.25 MCG capsule Take 0.25 mcg by mouth every Monday, Wednesday, and Friday with hemodialysis.  0  . cyanocobalamin (,VITAMIN B-12,) 1000 MCG/ML injection Inject 1,000 mcg into the muscle every 30 (thirty) days.     . Fluticasone-Umeclidin-Vilant (TRELEGY ELLIPTA) 100-62.5-25 MCG/INH AEPB Inhale 1 puff into the lungs daily. 28 each 5  . HYDROcodone-acetaminophen (NORCO) 5-325 MG tablet Take 1 tablet by mouth every 6 (six) hours as needed for moderate pain. 15 tablet 0  . levothyroxine (SYNTHROID, LEVOTHROID) 125 MCG tablet Take 125 mcg by mouth daily before breakfast.     . loperamide (IMODIUM) 2 MG capsule Take 2 capsules (4 mg total) by mouth as needed for diarrhea or loose stools. 30 capsule 0  . midodrine (PROAMATINE) 5 MG tablet Take 5 mg by mouth See admin  instructions. 5 mg PRIOR TO dialysis on Mon/Wed/Fri  0  . predniSONE (DELTASONE) 5 MG tablet Take 5 mg by mouth daily with breakfast.    . sodium bicarbonate 650 MG tablet Take 2 tablets (1,300 mg total) by mouth 2 (two) times daily. (Patient taking differently: Take 1,300 mg by mouth daily. ) 120 tablet 0  . nitroGLYCERIN (NITROSTAT) 0.4 MG SL tablet Place 1 tablet (0.4 mg total) under the tongue every 5 (five) minutes as needed for chest pain. 25 tablet 3   No current facility-administered medications for this visit.      ROS:  See HPI  Physical Exam:  Vitals:   01/05/18 1314  BP: 127/78  Resp: 18  Temp: 97.9 F (36.6 C)    Incision:  Well healed, palpable thrill throughout the graft Extremities:  Palpable radial pulse, grip 5/5 and sensation intact Heart RRR Lungs Non labored breathing  Assessment/Plan:  This is a 72 y.o. female who is s/p: Left AV graft placement  She reports no symptoms of steal.  Excellent thrill in av graft. The graft may be used as of 01/11/2018.  She will f/u PRN.     Roxy Horseman , PA-C [facility] [phone]

## 2018-02-23 ENCOUNTER — Other Ambulatory Visit: Payer: Self-pay | Admitting: Gastroenterology

## 2018-03-02 ENCOUNTER — Other Ambulatory Visit: Payer: Self-pay | Admitting: Gastroenterology

## 2018-03-08 NOTE — Anesthesia Preprocedure Evaluation (Addendum)
Anesthesia Evaluation  Patient identified by MRN, date of birth, ID band Patient awake    Reviewed: Allergy & Precautions, NPO status , Patient's Chart, lab work & pertinent test results  Airway Mallampati: II  TM Distance: >3 FB Neck ROM: Full    Dental no notable dental hx. (+) Teeth Intact, Dental Advisory Given   Pulmonary COPD,  COPD inhaler, former smoker,    Pulmonary exam normal breath sounds clear to auscultation       Cardiovascular hypertension, + Peripheral Vascular Disease and +CHF  Normal cardiovascular exam Rhythm:Regular Rate:Normal  TTE 07/2017 The echocardiogram shows normal ejection fraction and mild diastolic dysfunction.There is moderate aortic insufficiency. There have been no significant changes since 2015.   Neuro/Psych Seizures - (related to ESRF and the need for dialysis),  negative psych ROS   GI/Hepatic Neg liver ROS, GERD  ,Crohn's   Endo/Other  Hypothyroidism   Renal/GU Dialysis and CRFRenal diseaseDialysis MWF     Musculoskeletal  (+) Arthritis ,   Abdominal   Peds  Hematology negative hematology ROS (+) anemia ,   Anesthesia Other Findings On prednisone for Crohn's  Reproductive/Obstetrics                           Anesthesia Physical Anesthesia Plan  ASA: III  Anesthesia Plan: MAC   Post-op Pain Management:    Induction: Intravenous  PONV Risk Score and Plan: 2 and Treatment may vary due to age or medical condition  Airway Management Planned: Natural Airway and Simple Face Mask  Additional Equipment:   Intra-op Plan:   Post-operative Plan:   Informed Consent: I have reviewed the patients History and Physical, chart, labs and discussed the procedure including the risks, benefits and alternatives for the proposed anesthesia with the patient or authorized representative who has indicated his/her understanding and acceptance.   Dental advisory  given  Plan Discussed with: CRNA  Anesthesia Plan Comments:        Anesthesia Quick Evaluation

## 2018-03-09 ENCOUNTER — Encounter (HOSPITAL_COMMUNITY)
Admission: RE | Disposition: A | Discharge status: Home / Self Care | Payer: Self-pay | Source: Ambulatory Visit | Attending: Gastroenterology

## 2018-03-09 ENCOUNTER — Ambulatory Visit (HOSPITAL_COMMUNITY): Payer: Medicare Other | Admitting: Anesthesiology

## 2018-03-09 ENCOUNTER — Ambulatory Visit (HOSPITAL_COMMUNITY)
Admission: RE | Admit: 2018-03-09 | Discharge: 2018-03-09 | Disposition: A | Discharge status: Home / Self Care | Payer: Medicare Other | Source: Ambulatory Visit | Attending: Gastroenterology | Admitting: Gastroenterology

## 2018-03-09 ENCOUNTER — Encounter (HOSPITAL_COMMUNITY): Payer: Self-pay | Admitting: Emergency Medicine

## 2018-03-09 ENCOUNTER — Other Ambulatory Visit: Payer: Self-pay

## 2018-03-09 DIAGNOSIS — K5730 Diverticulosis of large intestine without perforation or abscess without bleeding: Secondary | ICD-10-CM | POA: Diagnosis not present

## 2018-03-09 DIAGNOSIS — K603 Anal fistula: Secondary | ICD-10-CM | POA: Diagnosis not present

## 2018-03-09 DIAGNOSIS — Z79899 Other long term (current) drug therapy: Secondary | ICD-10-CM | POA: Diagnosis not present

## 2018-03-09 DIAGNOSIS — Z87891 Personal history of nicotine dependence: Secondary | ICD-10-CM | POA: Insufficient documentation

## 2018-03-09 DIAGNOSIS — K633 Ulcer of intestine: Secondary | ICD-10-CM | POA: Diagnosis not present

## 2018-03-09 DIAGNOSIS — D124 Benign neoplasm of descending colon: Secondary | ICD-10-CM | POA: Diagnosis not present

## 2018-03-09 DIAGNOSIS — N186 End stage renal disease: Secondary | ICD-10-CM | POA: Insufficient documentation

## 2018-03-09 DIAGNOSIS — I132 Hypertensive heart and chronic kidney disease with heart failure and with stage 5 chronic kidney disease, or end stage renal disease: Secondary | ICD-10-CM | POA: Diagnosis not present

## 2018-03-09 DIAGNOSIS — K5090 Crohn's disease, unspecified, without complications: Secondary | ICD-10-CM | POA: Insufficient documentation

## 2018-03-09 DIAGNOSIS — E039 Hypothyroidism, unspecified: Secondary | ICD-10-CM | POA: Diagnosis not present

## 2018-03-09 DIAGNOSIS — Z992 Dependence on renal dialysis: Secondary | ICD-10-CM | POA: Diagnosis not present

## 2018-03-09 DIAGNOSIS — I509 Heart failure, unspecified: Secondary | ICD-10-CM | POA: Insufficient documentation

## 2018-03-09 DIAGNOSIS — J449 Chronic obstructive pulmonary disease, unspecified: Secondary | ICD-10-CM | POA: Diagnosis not present

## 2018-03-09 DIAGNOSIS — Z1211 Encounter for screening for malignant neoplasm of colon: Secondary | ICD-10-CM | POA: Diagnosis not present

## 2018-03-09 DIAGNOSIS — K640 First degree hemorrhoids: Secondary | ICD-10-CM | POA: Insufficient documentation

## 2018-03-09 HISTORY — PX: BIOPSY: SHX5522

## 2018-03-09 HISTORY — PX: COLONOSCOPY WITH PROPOFOL: SHX5780

## 2018-03-09 HISTORY — PX: POLYPECTOMY: SHX5525

## 2018-03-09 LAB — POCT I-STAT 4, (NA,K, GLUC, HGB,HCT)
Glucose, Bld: 53 mg/dL — ABNORMAL LOW (ref 70–99)
HCT: 36 % (ref 36.0–46.0)
Hemoglobin: 12.2 g/dL (ref 12.0–15.0)
Potassium: 3.6 mmol/L (ref 3.5–5.1)
Sodium: 136 mmol/L (ref 135–145)

## 2018-03-09 LAB — GLUCOSE, CAPILLARY
Glucose-Capillary: 91 mg/dL (ref 70–99)
Glucose-Capillary: 54 mg/dL — ABNORMAL LOW (ref 70–99)
Glucose-Capillary: 88 mg/dL (ref 70–99)

## 2018-03-09 SURGERY — COLONOSCOPY WITH PROPOFOL
Anesthesia: Monitor Anesthesia Care

## 2018-03-09 MED ORDER — PROPOFOL 10 MG/ML IV BOLUS
INTRAVENOUS | Status: DC | PRN
  Administered 2018-03-09 (×4): 20 mg via INTRAVENOUS

## 2018-03-09 MED ORDER — PROPOFOL 10 MG/ML IV BOLUS
INTRAVENOUS | Status: AC
  Filled 2018-03-09: qty 60

## 2018-03-09 MED ORDER — DEXTROSE 50 % IV SOLN
25.0000 mL | Freq: Once | INTRAVENOUS | Status: AC
  Administered 2018-03-09: 25 mL via INTRAVENOUS

## 2018-03-09 MED ORDER — SODIUM CHLORIDE 0.9 % IV SOLN
INTRAVENOUS | Status: DC
  Administered 2018-03-09: 07:00:00 via INTRAVENOUS

## 2018-03-09 MED ORDER — DEXTROSE 50 % IV SOLN
INTRAVENOUS | Status: AC
  Filled 2018-03-09: qty 50

## 2018-03-09 MED ORDER — PROPOFOL 500 MG/50ML IV EMUL
INTRAVENOUS | Status: DC | PRN
  Administered 2018-03-09: 120 ug/kg/min via INTRAVENOUS

## 2018-03-09 MED ORDER — LIDOCAINE HCL (CARDIAC) PF 100 MG/5ML IV SOSY
PREFILLED_SYRINGE | INTRAVENOUS | Status: DC | PRN
  Administered 2018-03-09: 60 mg via INTRATRACHEAL

## 2018-03-09 MED ORDER — PHENYLEPHRINE 40 MCG/ML (10ML) SYRINGE FOR IV PUSH (FOR BLOOD PRESSURE SUPPORT)
PREFILLED_SYRINGE | INTRAVENOUS | Status: DC | PRN
  Administered 2018-03-09 (×4): 80 ug via INTRAVENOUS

## 2018-03-09 SURGICAL SUPPLY — 22 items

## 2018-03-09 NOTE — Anesthesia Postprocedure Evaluation (Signed)
Anesthesia Post Note  Patient: Natasha [name]  Procedure(s) Performed: COLONOSCOPY WITH PROPOFOL (N/A ) BIOPSY POLYPECTOMY     Patient location during evaluation: Endoscopy Anesthesia Type: MAC Level of consciousness: awake and alert Pain management: pain level controlled Vital Signs Assessment: post-procedure vital signs reviewed and stable Respiratory status: spontaneous breathing, nonlabored ventilation, respiratory function stable and patient connected to nasal cannula oxygen Cardiovascular status: blood pressure returned to baseline and stable Postop Assessment: no apparent nausea or vomiting Anesthetic complications: no    Last Vitals:  Vitals:   03/09/18 0920 03/09/18 0930  BP: (!) 137/56 133/68  Pulse: 79 85  Resp: 18 14  Temp:    SpO2: 100% 100%    Last Pain:  Vitals:   03/09/18 0930  TempSrc:   PainSc: 0-No pain                 Natasha [name]

## 2018-03-09 NOTE — Op Note (Signed)
[facility] Patient Name: Natasha [name] Procedure Date: 03/09/2018 MRN: [id number] Attending MD: Lear Ng , MD Date of Birth: [birth year] CSN: [id number] Age: 72 Admit Type: Outpatient Procedure:                Colonoscopy Indications:              High risk colon cancer surveillance: Crohn's                            disease, Last colonoscopy: October 2018 Providers:                Lear Ng, MD, Zenon Mayo, RN, Charolette Child, Technician, Danley Danker, CRNA Referring MD:             [name] Medicines:                Propofol per Anesthesia, Monitored Anesthesia Care Complications:            No immediate complications. Estimated Blood Loss:     Estimated blood loss was minimal. Procedure:                Pre-Anesthesia Assessment:                           - Prior to the procedure, a History and Physical                            was performed, and patient medications and                            allergies were reviewed. The patient's tolerance of                            previous anesthesia was also reviewed. The risks                            and benefits of the procedure and the sedation                            options and risks were discussed with the patient.                            All questions were answered, and informed consent                            was obtained. Prior Anticoagulants: The patient has                            taken no previous anticoagulant or antiplatelet                            agents. ASA Grade Assessment: III - A patient with  severe systemic disease. After reviewing the risks                            and benefits, the patient was deemed in                            satisfactory condition to undergo the procedure.                           After obtaining informed consent, the colonoscope                            was  passed under direct vision. Throughout the                            procedure, the patient's blood pressure, pulse, and                            oxygen saturations were monitored continuously. The                            PCF-H190DL ([id number]) Olympus peds colonscope was                            introduced through the anus and advanced to the the                            cecum, identified by appendiceal orifice and                            ileocecal valve. The colonoscopy was performed                            without difficulty. The patient tolerated the                            procedure well. The quality of the bowel                            preparation was fair and fair but repeated                            irrigation led to a good and adequate prep. The                            rectum and ileocolonic anastomosis were                            photographed. Scope In: 8:25:05 AM Scope Out: 8:52:12 AM Scope Withdrawal Time: 0 hours 20 minutes 16 seconds  Total Procedure Duration: 0 hours 27 minutes 7 seconds  Findings:      A segmental area of moderately congested, erythematous, inflamed and       ulcerated mucosa was found at the anastomosis. Biopsies were taken with  a cold forceps for histology. Estimated blood loss was minimal.      A 7 mm polyp was found in the descending colon. The polyp was sessile.       The polyp was removed with a hot snare. Resection and retrieval were       complete. Estimated blood loss: none.      Normal mucosa was found from rectum to transverse colon. Biopsies were       taken with a cold forceps for histology. Estimated blood loss was       minimal.      A few small-mouthed diverticula were found in the sigmoid colon.      Internal hemorrhoids were found during retroflexion. The hemorrhoids       were medium-sized and Grade I (internal hemorrhoids that do not       prolapse).      The perianal exam findings include perianal  fistula.      Patchy inflammation, moderate in severity and characterized by       congestion (edema), erythema and aphthous ulcerations was found in the       neo-terminal Ileum. Biopsies were taken with a cold forceps for       histology. Estimated blood loss was minimal. Impression:               - Preparation of the colon was fair.                           - Congested, erythematous, inflamed and ulcerated                            mucosa at the colonic anastomosis. Biopsied.                           - One 7 mm polyp in the descending colon, removed                            with a hot snare. Resected and retrieved.                           - Normal mucosa from rectum to transverse colon.                            Biopsied.                           - Diverticulosis in the sigmoid colon.                           - Internal hemorrhoids.                           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                           - Crohn's disease with ileitis. Biopsied. Moderate Sedation:      N/A- Per Anesthesia Care Recommendation:           - Patient has a contact number available for  emergencies. The signs and symptoms of potential                            delayed complications were discussed with the                            patient. Return to normal activities tomorrow.                            Written discharge instructions were provided to the                            patient.                           - Await pathology results.                           - Repeat colonoscopy for surveillance based on                            pathology results. Procedure Code(s):        --- Professional ---                           [id number], Colonoscopy, flexible; with removal of                            tumor(s), polyp(s), or other lesion(s) by snare                            technique                           45380, 56, Colonoscopy, flexible;  with biopsy,                            single or multiple Diagnosis Code(s):        --- Professional ---                           K50.00, Crohn's disease of small intestine without                            complications                           D12.4, Benign neoplasm of descending colon                           K63.89, Other specified diseases of intestine                           K52.9, Noninfective gastroenteritis and colitis,                            unspecified  K63.3, Ulcer of intestine                           K64.0, First degree hemorrhoids                           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 All rights reserved. The codes documented in this report are preliminary and upon coder review may  be revised to meet current compliance requirements. Lear Ng, MD 03/09/2018 9:03:03 AM This report has been signed electronically. Number of Addenda: 0

## 2018-03-09 NOTE — Interval H&P Note (Signed)
History and Physical Interval Note:  03/09/2018 8:12 AM  Natasha [name]  has presented today for surgery, with the diagnosis of Crohn's/Diarrhea  The various methods of treatment have been discussed with the patient and family. After consideration of risks, benefits and other options for treatment, the patient has consented to  Procedure(s): COLONOSCOPY WITH PROPOFOL (N/A) as a surgical intervention .  The patient's history has been reviewed, patient examined, no change in status, stable for surgery.  I have reviewed the patient's chart and labs.  Questions were answered to the patient's satisfaction.     [place] C.

## 2018-03-09 NOTE — Transfer of Care (Signed)
Immediate Anesthesia Transfer of Care Note  Patient: Natasha [name]  Procedure(s) Performed: COLONOSCOPY WITH PROPOFOL (N/A )  Patient Location: Endoscopy Unit  Anesthesia Type:MAC  Level of Consciousness: awake  Airway & Oxygen Therapy: Patient Spontanous Breathing  Post-op Assessment: Report given to RN and Post -op Vital signs reviewed and stable  Post vital signs: Reviewed and stable  Last Vitals:  Vitals Value Taken Time  BP    Temp    Pulse    Resp    SpO2      Last Pain:  Vitals:   03/09/18 0705  TempSrc: Oral  PainSc: 0-No pain         Complications: No apparent anesthesia complications

## 2018-03-09 NOTE — H&P (Signed)
Date of Initial H&P: 02/18/18  History reviewed, patient examined, no change in status, stable for surgery.

## 2018-03-09 NOTE — Discharge Instructions (Signed)
YOU HAD AN ENDOSCOPIC PROCEDURE TODAY: Refer to the procedure report and other information in the discharge instructions given to you for any specific questions about what was found during the examination. If this information does not answer your questions, please call [facility] office at [phone] to clarify.   YOU SHOULD EXPECT: Some feelings of bloating in the abdomen. Passage of more gas than usual. Walking can help get rid of the air that was put into your GI tract during the procedure and reduce the bloating. If you had a lower endoscopy (such as a colonoscopy or flexible sigmoidoscopy) you may notice spotting of blood in your stool or on the toilet paper. Some abdominal soreness may be present for a day or two, also.  DIET: Your first meal following the procedure should be a light meal and then it is ok to progress to your normal diet. A half-sandwich or bowl of soup is an example of a good first meal. Heavy or fried foods are harder to digest and may make you feel nauseous or bloated. Drink plenty of fluids but you should avoid alcoholic beverages for 24 hours. If you had a esophageal dilation, please see attached instructions for diet.   ACTIVITY: Your care partner should take you home directly after the procedure. You should plan to take it easy, moving slowly for the rest of the day. You can resume normal activity the day after the procedure however YOU SHOULD NOT DRIVE, use power tools, machinery or perform tasks that involve climbing or major physical exertion for 24 hours (because of the sedation medicines used during the test).   SYMPTOMS TO REPORT IMMEDIATELY: A gastroenterologist can be reached at any hour. Please call [phone]  for any of the following symptoms:    Following lower endoscopy (colonoscopy, flexible sigmoidoscopy) Excessive amounts of blood in the stool  Significant tenderness, worsening of abdominal pains  Swelling of the abdomen that is new, acute  Fever of  100 or higher    FOLLOW UP:  If any biopsies were taken you will be contacted by phone or by letter within the next 1-3 weeks. Call [phone]  if you have not heard about the biopsies in 3 weeks.  Please also call with any specific questions about appointments or follow up tests.

## 2018-03-09 NOTE — Anesthesia Procedure Notes (Signed)
Date/Time: 03/09/2018 8:16 AM Performed by: Sharlette Dense, CRNA Oxygen Delivery Method: Simple face mask

## 2018-03-09 NOTE — Progress Notes (Signed)
I stat done to check patients labs since on dialysis. Glu found to be 53. Pt denies any symptoms but states "her sugar does drop when she doesn't eat" Dr. Lanetta Inch by to see pt. Order for 1/2 amp Dextrose 50 to be given . Will continue to assess. And recheck Glu in 8mn

## 2018-03-10 ENCOUNTER — Encounter (HOSPITAL_COMMUNITY): Payer: Self-pay | Admitting: Gastroenterology

## 2018-03-29 ENCOUNTER — Emergency Department (HOSPITAL_COMMUNITY): Payer: Medicare Other

## 2018-03-29 ENCOUNTER — Other Ambulatory Visit: Payer: Self-pay

## 2018-03-29 ENCOUNTER — Emergency Department (HOSPITAL_COMMUNITY)
Admission: EM | Admit: 2018-03-29 | Discharge: 2018-03-29 | Disposition: A | Discharge status: Home / Self Care | Payer: Medicare Other | Source: Home / Self Care | Attending: Emergency Medicine | Admitting: Emergency Medicine

## 2018-03-29 ENCOUNTER — Encounter (HOSPITAL_COMMUNITY): Payer: Self-pay | Admitting: *Deleted

## 2018-03-29 DIAGNOSIS — N186 End stage renal disease: Secondary | ICD-10-CM

## 2018-03-29 DIAGNOSIS — I120 Hypertensive chronic kidney disease with stage 5 chronic kidney disease or end stage renal disease: Secondary | ICD-10-CM | POA: Insufficient documentation

## 2018-03-29 DIAGNOSIS — Z79899 Other long term (current) drug therapy: Secondary | ICD-10-CM | POA: Insufficient documentation

## 2018-03-29 DIAGNOSIS — E039 Hypothyroidism, unspecified: Secondary | ICD-10-CM | POA: Insufficient documentation

## 2018-03-29 DIAGNOSIS — J0100 Acute maxillary sinusitis, unspecified: Secondary | ICD-10-CM

## 2018-03-29 DIAGNOSIS — J441 Chronic obstructive pulmonary disease with (acute) exacerbation: Secondary | ICD-10-CM | POA: Diagnosis not present

## 2018-03-29 LAB — COMPREHENSIVE METABOLIC PANEL
ALBUMIN: 3.1 g/dL — AB (ref 3.5–5.0)
ALT: 10 U/L (ref 0–44)
ANION GAP: 5 (ref 5–15)
AST: 13 U/L — ABNORMAL LOW (ref 15–41)
Alkaline Phosphatase: 63 U/L (ref 38–126)
BUN: 34 mg/dL — ABNORMAL HIGH (ref 8–23)
CO2: 24 mmol/L (ref 22–32)
Calcium: 8.6 mg/dL — ABNORMAL LOW (ref 8.9–10.3)
Chloride: 111 mmol/L (ref 98–111)
Creatinine, Ser: 7.74 mg/dL — ABNORMAL HIGH (ref 0.44–1.00)
GFR calc Af Amer: 5 mL/min — ABNORMAL LOW (ref >60)
GFR calc non Af Amer: 5 mL/min — ABNORMAL LOW (ref >60)
GLUCOSE: 86 mg/dL (ref 70–99)
Potassium: 2.7 mmol/L — CL (ref 3.5–5.1)
SODIUM: 140 mmol/L (ref 135–145)
Total Bilirubin: 0.7 mg/dL (ref 0.3–1.2)
Total Protein: 6.6 g/dL (ref 6.5–8.1)

## 2018-03-29 LAB — CBC WITH DIFFERENTIAL/PLATELET
Abs Immature Granulocytes: 0.04 10*3/uL (ref 0.00–0.07)
Basophils Absolute: 0 10*3/uL (ref 0.0–0.1)
Basophils Relative: 1 %
EOS ABS: 0.4 10*3/uL (ref 0.0–0.5)
Eosinophils Relative: 5 %
HCT: 35.2 % — ABNORMAL LOW (ref 36.0–46.0)
Hemoglobin: 10.3 g/dL — ABNORMAL LOW (ref 12.0–15.0)
IMMATURE GRANULOCYTES: 1 %
LYMPHS ABS: 1.4 10*3/uL (ref 0.7–4.0)
Lymphocytes Relative: 17 %
MCH: 30.8 pg (ref 26.0–34.0)
MCHC: 29.3 g/dL — ABNORMAL LOW (ref 30.0–36.0)
MCV: 105.4 fL — AB (ref 80.0–100.0)
Monocytes Absolute: 0.7 10*3/uL (ref 0.1–1.0)
Monocytes Relative: 8 %
NEUTROS PCT: 68 %
Neutro Abs: 5.8 10*3/uL (ref 1.7–7.7)
Platelets: 159 10*3/uL (ref 150–400)
RBC: 3.34 MIL/uL — ABNORMAL LOW (ref 3.87–5.11)
RDW: 14.6 % (ref 11.5–15.5)
WBC: 8.3 10*3/uL (ref 4.0–10.5)
nRBC: 0 % (ref 0.0–0.2)

## 2018-03-29 LAB — I-STAT CG4 LACTIC ACID, ED: Lactic Acid, Venous: 0.9 mmol/L (ref 0.5–1.9)

## 2018-03-29 MED ORDER — MAGNESIUM OXIDE 400 (241.3 MG) MG PO TABS
800.0000 mg | ORAL_TABLET | Freq: Once | ORAL | Status: AC
  Administered 2018-03-29: 800 mg via ORAL
  Filled 2018-03-29: qty 2

## 2018-03-29 MED ORDER — ONDANSETRON 4 MG PO TBDP
4.0000 mg | ORAL_TABLET | Freq: Once | ORAL | Status: AC
  Administered 2018-03-29: 4 mg via ORAL
  Filled 2018-03-29: qty 1

## 2018-03-29 MED ORDER — AMOXICILLIN-POT CLAVULANATE 875-125 MG PO TABS
1.0000 | ORAL_TABLET | Freq: Once | ORAL | Status: AC
  Administered 2018-03-29: 1 via ORAL
  Filled 2018-03-29: qty 1

## 2018-03-29 MED ORDER — AMOXICILLIN-POT CLAVULANATE 875-125 MG PO TABS: 1.0000 | ORAL_TABLET | Freq: Two times a day (BID) | ORAL | 0 refills | Status: DC

## 2018-03-29 MED ORDER — ONDANSETRON 4 MG PO TBDP: 4.0000 mg | ORAL_TABLET | Freq: Three times a day (TID) | ORAL | 0 refills | Status: DC | PRN

## 2018-03-29 MED ORDER — POTASSIUM CHLORIDE CRYS ER 20 MEQ PO TBCR
20.0000 meq | EXTENDED_RELEASE_TABLET | Freq: Once | ORAL | Status: AC
  Administered 2018-03-29: 20 meq via ORAL
  Filled 2018-03-29: qty 1

## 2018-03-29 NOTE — ED Triage Notes (Signed)
Pt in c/o cough and nausea vomiting, seen at urgent care and started on an antibiotic, symptoms have not improved, pt missed her dialysis today, no distress noted

## 2018-03-30 NOTE — ED Provider Notes (Signed)
[facility] EMERGENCY DEPARTMENT Provider Note   CSN: [id number] Arrival date & time: 03/29/18  1055     History   Chief Complaint Chief Complaint  Patient presents with  . Cough  . Emesis    HPI Natasha [name] is a 72 y.o. female.  72 yo F with a chief complaint of cough and congestion.  Going on for the past week or so.  Patient has been seen at urgent care as well as by her family doctor.  Started on cough medicine with minimal improvement.  Had one episode this morning where she coughed and then gagged and vomited x1.  Denies ongoing nausea.  Has been able to eat and drink without difficulty.  Denies abdominal pain.  Patient has ESRD and had dialysis this morning but felt too ill to go.  The history is provided by the patient.  Cough  This is a new problem. The current episode started more than 1 week ago. The problem occurs constantly. The problem has not changed since onset.The cough is productive of sputum. There has been no fever. Pertinent negatives include no chest pain, no chills, no sweats, no headaches, no rhinorrhea, no myalgias, no shortness of breath, no wheezing and no eye redness. She has tried nothing for the symptoms. The treatment provided no relief. She is a smoker. Her past medical history is significant for COPD.  Emesis   Associated symptoms include cough and a fever (subjective). Pertinent negatives include no arthralgias, no chills, no headaches, no myalgias and no sweats.    Past Medical History:  Diagnosis Date  . Anal fistula   . Anemia in chronic kidney disease (CKD)   . Aortic insufficiency    Echo 3/18: mod conc LVH, EF 60-65, no RWMA, Gr 1 DD, mod AI, mild MR, normal RVSF, Trivial TR  . Arthritis   . Borderline hypertension   . Bulging disc    L3-L4  . Chronic diarrhea    due to crohn's  . CKD (chronic kidney disease), stage IV ([place])    MWF- [address]  . Crohn's disease ([place])    chronic ileitis  . Dyspnea    with  exertion  . Emphysema/COPD ([place])   . GERD (gastroesophageal reflux disease)    denies  . History of blood transfusion   . History of glaucoma   . History of kidney stones   . History of pancreatitis    2008--  mercaptopurine  . History of small bowel obstruction    12-03-2010  due to crohn's ileitis  . Hypertension    no longer on medications  . Hypothyroidism, postsurgical    multinodule w/ hurthle cells  . Osteoporosis   . Perianal Crohn's disease ([place])   . Peripheral vascular disease ([place])    blood clot behind knee left leg  . Polyarthralgia   . Seizures ([place])    03/2017  . Wears partial dentures    upper    Patient Active Problem List   Diagnosis Date Noted  . Crohn's disease of colon with complication ([place])   . Aortic insufficiency 07/30/2016  . Atherosclerosis of aorta ([place]) 07/30/2016  . Pressure injury of skin 07/14/2016  . Gastroenteritis, acute 07/12/2016  . COPD (chronic obstructive pulmonary disease) ([place]) 10/12/2015  . Malnutrition of moderate degree ([place]) 09/13/2014  . history of Left leg DVT 09/10/2014  . Hypotension 09/10/2014  . Hypoglycemia 09/10/2014  . Ischiorectal abscess 09/09/2014  . Increased anion gap metabolic acidosis   .  Generalized abdominal pain   . Chronic diastolic CHF (congestive heart failure) ([place])   . Hypertensive heart disease   . Secondary hyperparathyroidism ([place])   . Other specified hypothyroidism   . Right shoulder pain   . Thrombocytopenia ([place])   . Severe protein-calorie malnutrition ([place])   . Thyroid activity decreased   . Emphysema of lung ([place])   . Nephrolithiasis 03/29/2014  . Spinal stenosis of lumbar region 11/01/2013  . Bilateral leg pain 09/28/2013  . Emphysema/COPD ([place])   . Acute dyspnea 09/20/2013  . Protein-calorie malnutrition, severe ([place]) 05/14/2013  . Salmonella enteritidis 06/11/2012  . Hypocalcemia 06/10/2012  . Hypothyroidism 06/10/2012  . Anemia of chronic disease 06/09/2012  . Hypomagnesemia  06/09/2012  . ESRD on dialysis ([employer]) 06/09/2012  . Dehydration 06/07/2012  . Crohn's disease without complication ([place]) 95/01/3266  . Metabolic acidosis 12/45/8099  . Hypokalemia 06/07/2012  . Nausea vomiting and diarrhea 06/07/2012  . UTI (lower urinary tract infection) 06/07/2012    Past Surgical History:  Procedure Laterality Date  . ABDOMINAL HYSTERECTOMY  1990   and  Appendectomy  . AV FISTULA PLACEMENT Left 12/08/2017   Procedure: INSERTION OF ARTERIOVENOUS (AV) GRAFT WITH ARTEGRAFT TO LEFT UPPER ARM;  Surgeon: Conrad Center Sandwich, MD;  Location: [facility];  Service: Vascular;  Laterality: Left;  . BASCILIC VEIN TRANSPOSITION Left 01/09/2017   Procedure: LEFT 1ST STAGE BRACHIAL VEIN TRANSPOSITION;  Surgeon: Conrad [place] Creek, MD;  Location: [facility];  Service: Vascular;  Laterality: Left;  . BASCILIC VEIN TRANSPOSITION Left 06/16/2017   Procedure: Second Stage BASILIC VEIN TRANSPOSITION  LEFT ARM;  Surgeon: Conrad Emmons, MD;  Location: [facility];  Service: Vascular;  Laterality: Left;  . BIOPSY  03/09/2018   Procedure: BIOPSY;  Surgeon: Wilford Corner, MD;  Location: [facility];  Service: Endoscopy;;  . CHOLECYSTECTOMY    . COLON RESECTION  x3 --  1978,  1987, 1989   ILEAL RESECTION x2/   [place]  . COLONOSCOPY WITH PROPOFOL N/A 09/25/2014   Procedure: COLONOSCOPY WITH PROPOFOL;  Surgeon: Garlan Fair, MD;  Location: [facility];  Service: Endoscopy;  Laterality: N/A;  . COLONOSCOPY WITH PROPOFOL N/A 03/09/2018   Procedure: COLONOSCOPY WITH PROPOFOL;  Surgeon: Wilford Corner, MD;  [facility]: [facility];  Service: Endoscopy;  Laterality: N/A;  . CYSTOSCOPY W/ URETERAL STENT PLACEMENT Right 11/19/2016   Procedure: CYSTOSCOPY WITH RIGHT RETROGRADE PYELOGRAM/ URETEROSCOPY WITH LASER AND RIGHT URETERAL STENT PLACEMENT;  Surgeon: Ardis Hughs, MD;  [facility]: [facility];  Service: Urology;  Laterality: Right;  . ESOPHAGOGASTRODUODENOSCOPY  03/04/2012   Procedure:  ESOPHAGOGASTRODUODENOSCOPY (EGD);  Surgeon: Garlan Fair, MD;  Location: [facility];  Service: Endoscopy;  Laterality: N/A;  . EXAMINATION UNDER ANESTHESIA N/A 09/12/2014   Procedure: EXAM UNDER ANESTHESIA;  Surgeon: Rolm Bookbinder, MD;  Location: [facility];  Service: General;  Laterality: N/A;  . EYE SURGERY     laser  . FLEXIBLE SIGMOIDOSCOPY  03/04/2012   Procedure: FLEXIBLE SIGMOIDOSCOPY;  Surgeon: Garlan Fair, MD;  [facility];  Service: Endoscopy;  Laterality: N/A;  . GLAUCOMA SURGERY Bilateral   . HOLMIUM LASER APPLICATION Right 8/33/8250   Procedure: HOLMIUM LASER APPLICATION;  Surgeon: Ardis Hughs, MD;  [facility];  Service: Urology;  Laterality: Right;  . INCISION AND DRAINAGE PERIRECTAL ABSCESS N/A 09/12/2014   Procedure: IRRIGATION AND DEBRIDEMENT PERIRECTAL ABSCESS;  Surgeon: Rolm Bookbinder, MD;  Location: [facility];  Service: General;  Laterality: N/A;  . IR FLUORO GUIDE CV  LINE RIGHT  03/20/2017  . IR US GUIDE VASC ACCESS RIGHT  03/20/2017  . NEGATIVE SLEEP STUDY  2008  . PLACEMENT OF SETON N/A 12/01/2014   Procedure: PLACEMENT OF SETON;  Surgeon: [name], MD;  [facility];  Service: General;  Laterality: N/A;  . POLYPECTOMY  03/09/2018   Procedure: POLYPECTOMY;  Surgeon: Wilford Corner, MD;  [facility];  Service: Endoscopy;;  . RECTAL EXAM UNDER ANESTHESIA N/A 12/01/2014   Procedure: RECTAL EXAM UNDER ANESTHESIA;  Surgeon: [name], MD;  [facility];  Service: General;  Laterality: N/A;  . TOTAL THYROIDECTOMY  10-13-2003  . TRANSTHORACIC ECHOCARDIOGRAM  07-11-2013   mild LVH,  ef 55%,  moderate AR,  mild MR and TR,  trivial PR  . TUBAL LIGATION       OB History   None      Home Medications    Prior to Admission medications   Medication Sig Start Date End Date Taking? Authorizing Provider  acetaminophen (TYLENOL) 500 MG tablet Take 500 mg by mouth every 6 (six)  hours as needed for mild pain.     [provider]  Adalimumab (HUMIRA) 40 MG/0.8ML PSKT Inject 40 mg into the skin every 14 (fourteen) days.    [provider]  albuterol (PROVENTIL HFA;VENTOLIN HFA) 108 (90 Base) MCG/ACT inhaler Inhale 2 puffs into the lungs every 6 (six) hours as needed for wheezing or shortness of breath. 06/09/17   Chesley Mires, MD  alendronate (FOSAMAX) 70 MG tablet Take 70 mg by mouth every Monday.  11/06/16   [provider]  amoxicillin-clavulanate (AUGMENTIN) 875-125 MG tablet Take 1 tablet by mouth 2 (two) times daily. 03/29/18   Deno Etienne, DO  calcitRIOL (ROCALTROL) 0.25 MCG capsule Take 0.25 mcg by mouth every Monday, Wednesday, and Friday with hemodialysis. 02/03/17   [provider]  cyanocobalamin (,VITAMIN B-12,) 1000 MCG/ML injection Inject 1,000 mcg into the muscle every 30 (thirty) days.     [provider]  Fluticasone-Umeclidin-Vilant (TRELEGY ELLIPTA) 100-62.5-25 MCG/INH AEPB Inhale 1 puff into the lungs daily. 02/27/17   Chesley Mires, MD  HYDROcodone-acetaminophen (NORCO) 5-325 MG tablet Take 1 tablet by mouth every 6 (six) hours as needed for moderate pain. 12/08/17   Dagoberto Ligas, PA-C  levothyroxine (SYNTHROID, LEVOTHROID) 125 MCG tablet Take 125 mcg by mouth daily before breakfast.     [provider]  loperamide (IMODIUM) 2 MG capsule Take 2 capsules (4 mg total) by mouth as needed for diarrhea or loose stools. 03/27/17   Caren Griffins, MD  midodrine (PROAMATINE) 5 MG tablet Take 5 mg by mouth See admin instructions. 5 mg PRIOR TO dialysis on Mon/Wed/Fri 04/30/17   [provider]  nitroGLYCERIN (NITROSTAT) 0.4 MG SL tablet Place 1 tablet (0.4 mg total) under the tongue every 5 (five) minutes as needed for chest pain. 08/19/16 06/12/17  Richardson Dopp T, PA-C  ondansetron (ZOFRAN ODT) 4 MG disintegrating tablet Take 1 tablet (4 mg total) by mouth every 8 (eight) hours as needed for nausea or  vomiting. 03/29/18   Deno Etienne, DO  predniSONE (DELTASONE) 5 MG tablet Take 5 mg by mouth daily with breakfast.    [provider]  sodium bicarbonate 650 MG tablet Take 2 tablets (1,300 mg total) by mouth 2 (two) times daily. Patient taking differently: Take 1,300 mg by mouth daily.  11/21/16   Mariel Aloe, MD    Family History Family History  Problem Relation  Age of Onset  . Hypertension Father   . Heart disease Father   . Heart attack Father   . Diverticulitis Mother   . COPD Mother   . Hypertension Mother   . Heart disease Mother   . Heart attack Mother   . Heart attack Brother   . Colon cancer Brother   . Diabetes Brother   . Thyroid disease Brother     Social History Social History   Tobacco Use  . Smoking status: Former Smoker    Packs/day: 0.25    Years: 15.00    Pack years: 3.75    Types: Cigarettes    Last attempt to quit: 11/27/2001    Years since quitting: 16.3  . Smokeless tobacco: Never Used  Substance Use Topics  . Alcohol use: No  . Drug use: No     Allergies   Mercaptopurine and Remicade [infliximab]   Review of Systems Review of Systems  Constitutional: Positive for fever (subjective). Negative for chills.  HENT: Positive for congestion. Negative for rhinorrhea.   Eyes: Negative for redness and visual disturbance.  Respiratory: Positive for cough. Negative for shortness of breath and wheezing.   Cardiovascular: Negative for chest pain and palpitations.  Gastrointestinal: Positive for vomiting. Negative for nausea.  Genitourinary: Negative for dysuria and urgency.  Musculoskeletal: Negative for arthralgias and myalgias.  Skin: Negative for pallor and wound.  Neurological: Negative for dizziness and headaches.     Physical Exam Updated Vital Signs BP 125/63 (BP Location: Right Arm) Comment: Simultaneous filing. User may not have seen previous data.  Pulse 100   Temp 99.1 F (37.3 C) (Oral)   Resp 18   SpO2 95%   Physical  Exam  Constitutional: She is oriented to person, place, and time. She appears well-developed and well-nourished. No distress.  HENT:  Head: Normocephalic and atraumatic.  Swollen turbinates, posterior nasal drip, exquisitely tender to percussion of the left maxillary sinus, tm normal bilaterally.    Eyes: Pupils are equal, round, and reactive to light. EOM are normal.  Neck: Normal range of motion. Neck supple.  Cardiovascular: Normal rate and regular rhythm. Exam reveals no gallop and no friction rub.  No murmur heard. Pulmonary/Chest: Effort normal. She has no wheezes. She has no rales.  Abdominal: Soft. She exhibits no distension. There is no tenderness.  Musculoskeletal: She exhibits no edema or tenderness.  Neurological: She is alert and oriented to person, place, and time.  Skin: Skin is warm and dry. She is not diaphoretic.  Psychiatric: She has a normal mood and affect. Her behavior is normal.  Nursing note and vitals reviewed.    ED Treatments / Results  Labs (all labs ordered are listed, but only abnormal results are displayed) Labs Reviewed  COMPREHENSIVE METABOLIC PANEL - Abnormal; Notable for the following components:      Result Value   Potassium 2.7 (*)    BUN 34 (*)    Creatinine, Ser 7.74 (*)    Calcium 8.6 (*)    Albumin 3.1 (*)    AST 13 (*)    GFR calc non Af Amer 5 (*)    GFR calc Af Amer 5 (*)    All other components within normal limits  CBC WITH DIFFERENTIAL/PLATELET - Abnormal; Notable for the following components:   RBC 3.34 (*)    Hemoglobin 10.3 (*)    HCT 35.2 (*)    MCV 105.4 (*)    MCHC 29.3 (*)    All other components  within normal limits  I-STAT CG4 LACTIC ACID, ED    EKG EKG Interpretation  Date/Time:  Monday March 29 2018 12:56:02 EST Ventricular Rate:  97 PR Interval:  124 QRS Duration: 82 QT Interval:  398 QTC Calculation: 505 R Axis:   83 Text Interpretation:  Normal sinus rhythm Right atrial enlargement Moderate voltage  criteria for LVH, may be normal variant Nonspecific ST and T wave abnormality Abnormal ECG No significant change since last tracing Confirmed by Deno Etienne [phone]) on 03/29/2018 1:22:56 PM Also confirmed by Deno Etienne [phone]), editor Lynder Parents [phone])  on 03/29/2018 2:35:50 PM   Radiology Dg Chest 2 View  Result Date: 03/29/2018 CLINICAL DATA:  Cough EXAM: CHEST - 2 VIEW COMPARISON:  05/04/2017 FINDINGS: Interstitial coarsening and hyperinflation. There is no edema, consolidation, effusion, or pneumothorax. Normal heart size and mediastinal contours. Venous stent at the left axilla. Changes of probable parathyroidectomy sub. IMPRESSION: COPD without acute superimposed finding. Electronically Signed   By: Monte Fantasia M.D.   On: 03/29/2018 12:01    Procedures Procedures (including critical care time)  Medications Ordered in ED Medications  potassium chloride SA (K-DUR,KLOR-CON) CR tablet 20 mEq (20 mEq Oral Given 03/29/18 1403)  magnesium oxide (MAG-OX) tablet 800 mg (800 mg Oral Given 03/29/18 1403)  ondansetron (ZOFRAN-ODT) disintegrating tablet 4 mg (4 mg Oral Given 03/29/18 1403)  amoxicillin-clavulanate (AUGMENTIN) 875-125 MG per tablet 1 tablet (1 tablet Oral Given 03/29/18 1403)     Initial Impression / Assessment and Plan / ED Course  I have reviewed the triage vital signs and the nursing notes.  Pertinent labs & imaging results that were available during my care of the patient were reviewed by me and considered in my medical decision making (see chart for details).     72 yo F with a chief complaint of cough congestion and one episode of emesis.  Clinically the patient has sinusitis with significant left maxillary sinus pain.  Will treat with antibiotics.  The patient does have hyperkalemia, will give a small amount of potassium as the patient has end-stage renal disease.  Discharge home.    I have discussed the diagnosis/risks/treatment options with the patient and family  and believe the pt to be eligible for discharge home to follow-up with PCP. We also discussed returning to the ED immediately if new or worsening sx occur. We discussed the sx which are most concerning (e.g., sudden worsening pain, fever, inability to tolerate by mouth) that necessitate immediate return. Medications administered to the patient during their visit and any new prescriptions provided to the patient are listed below.  Medications given during this visit Medications  potassium chloride SA (K-DUR,KLOR-CON) CR tablet 20 mEq (20 mEq Oral Given 03/29/18 1403)  magnesium oxide (MAG-OX) tablet 800 mg (800 mg Oral Given 03/29/18 1403)  ondansetron (ZOFRAN-ODT) disintegrating tablet 4 mg (4 mg Oral Given 03/29/18 1403)  amoxicillin-clavulanate (AUGMENTIN) 875-125 MG per tablet 1 tablet (1 tablet Oral Given 03/29/18 1403)     The patient appears reasonably screen and/or stabilized for discharge and I doubt any other medical condition or other [facility] requiring further screening, evaluation, or treatment in the ED at this time prior to discharge.    Final Clinical Impressions(s) / ED Diagnoses   Final diagnoses:  Acute maxillary sinusitis, recurrence not specified    ED Discharge Orders         Ordered    amoxicillin-clavulanate (AUGMENTIN) 875-125 MG tablet  2 times daily     03/29/18 1350  ondansetron (ZOFRAN ODT) 4 MG disintegrating tablet  Every 8 hours PRN     03/29/18 [place], [place], DO 03/30/18 1001

## 2018-03-31 ENCOUNTER — Encounter (HOSPITAL_COMMUNITY): Payer: Self-pay | Admitting: Emergency Medicine

## 2018-03-31 ENCOUNTER — Other Ambulatory Visit: Payer: Self-pay

## 2018-03-31 ENCOUNTER — Inpatient Hospital Stay (HOSPITAL_COMMUNITY)
Admission: EM | Admit: 2018-03-31 | Discharge: 2018-04-02 | DRG: 190 | Disposition: A | Discharge status: Home / Self Care | Payer: Medicare Other | Attending: Internal Medicine | Admitting: Internal Medicine

## 2018-03-31 ENCOUNTER — Emergency Department (HOSPITAL_COMMUNITY): Payer: Medicare Other

## 2018-03-31 DIAGNOSIS — J209 Acute bronchitis, unspecified: Secondary | ICD-10-CM | POA: Diagnosis present

## 2018-03-31 DIAGNOSIS — D649 Anemia, unspecified: Secondary | ICD-10-CM | POA: Diagnosis present

## 2018-03-31 DIAGNOSIS — Z87891 Personal history of nicotine dependence: Secondary | ICD-10-CM

## 2018-03-31 DIAGNOSIS — E890 Postprocedural hypothyroidism: Secondary | ICD-10-CM | POA: Diagnosis present

## 2018-03-31 DIAGNOSIS — I120 Hypertensive chronic kidney disease with stage 5 chronic kidney disease or end stage renal disease: Secondary | ICD-10-CM | POA: Diagnosis present

## 2018-03-31 DIAGNOSIS — Z992 Dependence on renal dialysis: Secondary | ICD-10-CM | POA: Diagnosis not present

## 2018-03-31 DIAGNOSIS — Z86718 Personal history of other venous thrombosis and embolism: Secondary | ICD-10-CM

## 2018-03-31 DIAGNOSIS — E876 Hypokalemia: Secondary | ICD-10-CM | POA: Diagnosis present

## 2018-03-31 DIAGNOSIS — L89899 Pressure ulcer of other site, unspecified stage: Secondary | ICD-10-CM | POA: Diagnosis present

## 2018-03-31 DIAGNOSIS — J019 Acute sinusitis, unspecified: Secondary | ICD-10-CM | POA: Diagnosis present

## 2018-03-31 DIAGNOSIS — Z79899 Other long term (current) drug therapy: Secondary | ICD-10-CM

## 2018-03-31 DIAGNOSIS — J0100 Acute maxillary sinusitis, unspecified: Secondary | ICD-10-CM | POA: Diagnosis not present

## 2018-03-31 DIAGNOSIS — I739 Peripheral vascular disease, unspecified: Secondary | ICD-10-CM | POA: Diagnosis present

## 2018-03-31 DIAGNOSIS — Z7951 Long term (current) use of inhaled steroids: Secondary | ICD-10-CM | POA: Diagnosis not present

## 2018-03-31 DIAGNOSIS — K219 Gastro-esophageal reflux disease without esophagitis: Secondary | ICD-10-CM | POA: Diagnosis present

## 2018-03-31 DIAGNOSIS — D631 Anemia in chronic kidney disease: Secondary | ICD-10-CM | POA: Diagnosis present

## 2018-03-31 DIAGNOSIS — K5080 Crohn's disease of both small and large intestine without complications: Secondary | ICD-10-CM | POA: Diagnosis present

## 2018-03-31 DIAGNOSIS — E039 Hypothyroidism, unspecified: Secondary | ICD-10-CM | POA: Diagnosis not present

## 2018-03-31 DIAGNOSIS — N186 End stage renal disease: Secondary | ICD-10-CM | POA: Diagnosis present

## 2018-03-31 DIAGNOSIS — H409 Unspecified glaucoma: Secondary | ICD-10-CM | POA: Diagnosis present

## 2018-03-31 DIAGNOSIS — M810 Age-related osteoporosis without current pathological fracture: Secondary | ICD-10-CM | POA: Diagnosis present

## 2018-03-31 DIAGNOSIS — Z888 Allergy status to other drugs, medicaments and biological substances status: Secondary | ICD-10-CM | POA: Diagnosis not present

## 2018-03-31 DIAGNOSIS — Z681 Body mass index (BMI) 19 or less, adult: Secondary | ICD-10-CM

## 2018-03-31 DIAGNOSIS — D638 Anemia in other chronic diseases classified elsewhere: Secondary | ICD-10-CM | POA: Diagnosis present

## 2018-03-31 DIAGNOSIS — R636 Underweight: Secondary | ICD-10-CM | POA: Diagnosis present

## 2018-03-31 DIAGNOSIS — J441 Chronic obstructive pulmonary disease with (acute) exacerbation: Secondary | ICD-10-CM | POA: Diagnosis present

## 2018-03-31 DIAGNOSIS — K50119 Crohn's disease of large intestine with unspecified complications: Secondary | ICD-10-CM | POA: Diagnosis not present

## 2018-03-31 LAB — CBC WITH DIFFERENTIAL/PLATELET
Abs Immature Granulocytes: 0.03 10*3/uL (ref 0.00–0.07)
BASOS PCT: 0 %
Basophils Absolute: 0 10*3/uL (ref 0.0–0.1)
EOS ABS: 0.3 10*3/uL (ref 0.0–0.5)
Eosinophils Relative: 4 %
HEMATOCRIT: 35 % — AB (ref 36.0–46.0)
Hemoglobin: 10.3 g/dL — ABNORMAL LOW (ref 12.0–15.0)
Immature Granulocytes: 1 %
Lymphocytes Relative: 25 %
Lymphs Abs: 1.6 10*3/uL (ref 0.7–4.0)
MCH: 30.9 pg (ref 26.0–34.0)
MCHC: 29.4 g/dL — ABNORMAL LOW (ref 30.0–36.0)
MCV: 105.1 fL — AB (ref 80.0–100.0)
MONOS PCT: 9 %
Monocytes Absolute: 0.6 10*3/uL (ref 0.1–1.0)
NRBC: 0 % (ref 0.0–0.2)
Neutro Abs: 3.8 10*3/uL (ref 1.7–7.7)
Neutrophils Relative %: 61 %
Platelets: 122 10*3/uL — ABNORMAL LOW (ref 150–400)
RBC: 3.33 MIL/uL — ABNORMAL LOW (ref 3.87–5.11)
RDW: 14.7 % (ref 11.5–15.5)
WBC: 6.3 10*3/uL (ref 4.0–10.5)

## 2018-03-31 LAB — LIPASE, BLOOD: Lipase: 52 U/L — ABNORMAL HIGH (ref 11–51)

## 2018-03-31 LAB — BASIC METABOLIC PANEL
ANION GAP: 10 (ref 5–15)
BUN: 15 mg/dL (ref 8–23)
CALCIUM: 8.5 mg/dL — AB (ref 8.9–10.3)
CO2: 29 mmol/L (ref 22–32)
Chloride: 101 mmol/L (ref 98–111)
Creatinine, Ser: 4.5 mg/dL — ABNORMAL HIGH (ref 0.44–1.00)
GFR, EST AFRICAN AMERICAN: 10 mL/min — AB (ref >60)
GFR, EST NON AFRICAN AMERICAN: 9 mL/min — AB (ref >60)
GLUCOSE: 134 mg/dL — AB (ref 70–99)
Potassium: 2.6 mmol/L — CL (ref 3.5–5.1)
Sodium: 140 mmol/L (ref 135–145)

## 2018-03-31 LAB — HEPATIC FUNCTION PANEL
ALK PHOS: 65 U/L (ref 38–126)
ALT: 10 U/L (ref 0–44)
AST: 19 U/L (ref 15–41)
Albumin: 3 g/dL — ABNORMAL LOW (ref 3.5–5.0)
BILIRUBIN TOTAL: 0.5 mg/dL (ref 0.3–1.2)
Bilirubin, Direct: <0.1 mg/dL (ref 0.0–0.2)
Total Protein: 6.6 g/dL (ref 6.5–8.1)

## 2018-03-31 LAB — MAGNESIUM: Magnesium: 3.1 mg/dL — ABNORMAL HIGH (ref 1.7–2.4)

## 2018-03-31 LAB — GLUCOSE, CAPILLARY: GLUCOSE-CAPILLARY: 162 mg/dL — AB (ref 70–99)

## 2018-03-31 LAB — MRSA PCR SCREENING: MRSA by PCR: NEGATIVE

## 2018-03-31 MED ORDER — NEPRO/CARBSTEADY PO LIQD
237.0000 mL | Freq: Two times a day (BID) | ORAL | Status: DC
  Filled 2018-03-31 (×4): qty 237

## 2018-03-31 MED ORDER — FLUTICASONE-UMECLIDIN-VILANT 100-62.5-25 MCG/INH IN AEPB: 1.0000 | INHALATION_SPRAY | Freq: Every day | RESPIRATORY_TRACT | Status: DC

## 2018-03-31 MED ORDER — AMOXICILLIN-POT CLAVULANATE 500-125 MG PO TABS
1.0000 | ORAL_TABLET | ORAL | Status: DC
  Administered 2018-03-31 – 2018-04-02 (×3): 500 mg via ORAL
  Filled 2018-03-31 (×3): qty 1

## 2018-03-31 MED ORDER — FLUTICASONE FUROATE-VILANTEROL 100-25 MCG/INH IN AEPB
1.0000 | INHALATION_SPRAY | Freq: Every day | RESPIRATORY_TRACT | Status: DC
  Administered 2018-03-31 – 2018-04-02 (×2): 1 via RESPIRATORY_TRACT
  Filled 2018-03-31: qty 28

## 2018-03-31 MED ORDER — ONDANSETRON HCL 4 MG PO TABS
4.0000 mg | ORAL_TABLET | Freq: Four times a day (QID) | ORAL | Status: DC | PRN
  Administered 2018-03-31: 4 mg via ORAL
  Filled 2018-03-31: qty 1

## 2018-03-31 MED ORDER — CALCITRIOL 0.5 MCG PO CAPS
1.7500 ug | ORAL_CAPSULE | ORAL | Status: DC
  Administered 2018-04-02: 1.75 ug via ORAL

## 2018-03-31 MED ORDER — SODIUM CHLORIDE 0.9% FLUSH
3.0000 mL | Freq: Two times a day (BID) | INTRAVENOUS | Status: DC
  Administered 2018-03-31 – 2018-04-02 (×5): 3 mL via INTRAVENOUS

## 2018-03-31 MED ORDER — POTASSIUM CHLORIDE CRYS ER 20 MEQ PO TBCR
40.0000 meq | EXTENDED_RELEASE_TABLET | Freq: Once | ORAL | Status: AC
  Administered 2018-03-31: 40 meq via ORAL
  Filled 2018-03-31: qty 2

## 2018-03-31 MED ORDER — AMOXICILLIN-POT CLAVULANATE 875-125 MG PO TABS
1.0000 | ORAL_TABLET | Freq: Two times a day (BID) | ORAL | Status: DC
  Filled 2018-03-31: qty 1

## 2018-03-31 MED ORDER — CALCITRIOL 0.25 MCG PO CAPS
0.2500 ug | ORAL_CAPSULE | ORAL | Status: DC
  Administered 2018-03-31: 0.25 ug via ORAL
  Filled 2018-03-31: qty 1

## 2018-03-31 MED ORDER — HEPARIN SODIUM (PORCINE) 5000 UNIT/ML IJ SOLN
5000.0000 [IU] | Freq: Three times a day (TID) | INTRAMUSCULAR | Status: DC
  Administered 2018-03-31 – 2018-04-02 (×6): 5000 [IU] via SUBCUTANEOUS
  Filled 2018-03-31 (×6): qty 1

## 2018-03-31 MED ORDER — ALBUTEROL SULFATE (2.5 MG/3ML) 0.083% IN NEBU
5.0000 mg | INHALATION_SOLUTION | Freq: Once | RESPIRATORY_TRACT | Status: AC
  Administered 2018-03-31: 5 mg via RESPIRATORY_TRACT
  Filled 2018-03-31: qty 6

## 2018-03-31 MED ORDER — ONDANSETRON HCL 4 MG/2ML IJ SOLN
4.0000 mg | Freq: Four times a day (QID) | INTRAMUSCULAR | Status: DC | PRN
  Administered 2018-04-02: 4 mg via INTRAVENOUS
  Filled 2018-03-31: qty 2

## 2018-03-31 MED ORDER — FENTANYL CITRATE (PF) 100 MCG/2ML IJ SOLN: 50.0000 ug | INTRAMUSCULAR | Status: DC | PRN

## 2018-03-31 MED ORDER — POTASSIUM CHLORIDE 10 MEQ/100ML IV SOLN
10.0000 meq | Freq: Once | INTRAVENOUS | Status: DC
  Filled 2018-03-31: qty 100

## 2018-03-31 MED ORDER — CHLORHEXIDINE GLUCONATE CLOTH 2 % EX PADS
6.0000 | MEDICATED_PAD | Freq: Every day | CUTANEOUS | Status: DC
  Administered 2018-04-01: 6 via TOPICAL

## 2018-03-31 MED ORDER — SODIUM BICARBONATE 650 MG PO TABS
1300.0000 mg | ORAL_TABLET | Freq: Every day | ORAL | Status: DC
  Administered 2018-03-31 – 2018-04-02 (×3): 1300 mg via ORAL
  Filled 2018-03-31 (×3): qty 2

## 2018-03-31 MED ORDER — FENTANYL CITRATE (PF) 100 MCG/2ML IJ SOLN
100.0000 ug | Freq: Once | INTRAMUSCULAR | Status: AC
  Administered 2018-03-31: 100 ug via INTRAVENOUS
  Filled 2018-03-31: qty 2

## 2018-03-31 MED ORDER — METHYLPREDNISOLONE SODIUM SUCC 125 MG IJ SOLR
60.0000 mg | Freq: Four times a day (QID) | INTRAMUSCULAR | Status: DC
  Filled 2018-03-31: qty 2

## 2018-03-31 MED ORDER — METHYLPREDNISOLONE SODIUM SUCC 125 MG IJ SOLR
60.0000 mg | Freq: Two times a day (BID) | INTRAMUSCULAR | Status: DC
  Administered 2018-03-31 – 2018-04-01 (×2): 60 mg via INTRAVENOUS
  Filled 2018-03-31 (×2): qty 2

## 2018-03-31 MED ORDER — ALBUTEROL SULFATE (2.5 MG/3ML) 0.083% IN NEBU
2.5000 mg | INHALATION_SOLUTION | RESPIRATORY_TRACT | Status: DC | PRN
  Administered 2018-04-01 – 2018-04-02 (×2): 2.5 mg via RESPIRATORY_TRACT
  Filled 2018-03-31 (×2): qty 3

## 2018-03-31 MED ORDER — MIDODRINE HCL 5 MG PO TABS
5.0000 mg | ORAL_TABLET | ORAL | Status: DC
  Administered 2018-03-31 – 2018-04-02 (×2): 5 mg via ORAL
  Filled 2018-03-31 (×2): qty 1

## 2018-03-31 MED ORDER — HYDROCODONE-ACETAMINOPHEN 5-325 MG PO TABS
1.0000 | ORAL_TABLET | Freq: Four times a day (QID) | ORAL | Status: DC | PRN
  Administered 2018-03-31 – 2018-04-02 (×2): 1 via ORAL
  Filled 2018-03-31 (×2): qty 1

## 2018-03-31 MED ORDER — UMECLIDINIUM BROMIDE 62.5 MCG/INH IN AEPB
1.0000 | INHALATION_SPRAY | Freq: Every day | RESPIRATORY_TRACT | Status: DC
  Administered 2018-03-31 – 2018-04-02 (×2): 1 via RESPIRATORY_TRACT
  Filled 2018-03-31: qty 7

## 2018-03-31 MED ORDER — ONDANSETRON HCL 4 MG/2ML IJ SOLN
INTRAMUSCULAR | Status: AC
  Administered 2018-03-31: 4 mg
  Filled 2018-03-31: qty 2

## 2018-03-31 MED ORDER — SODIUM CHLORIDE 0.9% FLUSH
3.0000 mL | Freq: Two times a day (BID) | INTRAVENOUS | Status: DC
  Administered 2018-03-31 – 2018-04-01 (×2): 3 mL via INTRAVENOUS

## 2018-03-31 MED ORDER — SODIUM CHLORIDE 0.9 % IV SOLN: 250.0000 mL | INTRAVENOUS | Status: DC | PRN

## 2018-03-31 MED ORDER — SODIUM CHLORIDE 0.9% FLUSH: 3.0000 mL | INTRAVENOUS | Status: DC | PRN

## 2018-03-31 MED ORDER — ALBUTEROL (5 MG/ML) CONTINUOUS INHALATION SOLN
10.0000 mg/h | INHALATION_SOLUTION | Freq: Once | RESPIRATORY_TRACT | Status: AC
  Administered 2018-03-31: 10 mg/h via RESPIRATORY_TRACT
  Filled 2018-03-31: qty 20

## 2018-03-31 MED ORDER — LEVOTHYROXINE SODIUM 25 MCG PO TABS
125.0000 ug | ORAL_TABLET | Freq: Every day | ORAL | Status: DC
  Administered 2018-04-01 – 2018-04-02 (×2): 125 ug via ORAL
  Filled 2018-03-31 (×3): qty 1

## 2018-03-31 MED ORDER — ACETAMINOPHEN 325 MG PO TABS: 650.0000 mg | ORAL_TABLET | Freq: Four times a day (QID) | ORAL | Status: DC | PRN

## 2018-03-31 MED ORDER — ACETAMINOPHEN 650 MG RE SUPP: 650.0000 mg | Freq: Four times a day (QID) | RECTAL | Status: DC | PRN

## 2018-03-31 MED ORDER — PREDNISONE 5 MG PO TABS: 5.0000 mg | ORAL_TABLET | Freq: Every day | ORAL | Status: DC

## 2018-03-31 NOTE — ED Triage Notes (Signed)
Patient with chest tightness and shortness of breath for the last day.  Getting worse this evening.  She is a dialysis patient, states she got dialysis every day last week and yesterday and due this morning.  She skipped on Monday due to being in the hospital.  She had fluid taken off last week and just had blood cleaning yesterday.  She is short of breath, increased work of breathing, accessory muscle use and retractions.  She was given 2 duonebs by EMS and 2gm Magnesium and 11m Solumedrol.  She has a cough which she was seen on Monday for.

## 2018-03-31 NOTE — Progress Notes (Addendum)
Patient admitted after midnight, please see H&P.  Admitted with COPD exacerbation-- currently on 3L O2.  Have notified renal of need for HD.  K was replaced this AM-- recheck in AM. Continue IV steroids-- patient does not smoke anymore but husband and grandson do around her.  Patient acutely ill and high risk of respiratory compromise despite IV steroids, suspect will be here > 2 midnights, will change to [place] DO

## 2018-03-31 NOTE — ED Provider Notes (Signed)
[facility] EMERGENCY DEPARTMENT Provider Note   CSN: [id number] Arrival date & time: 03/31/18  0127     History   Chief Complaint Chief Complaint  Patient presents with  . Shortness of Breath   Level 5 caveat due to acuity of condition HPI Natasha [name] is a 72 y.o. female.  The history is provided by the patient. The history is limited by the condition of the patient.  Shortness of Breath  This is a new problem. The problem occurs frequently.The problem has been rapidly worsening. Associated symptoms include cough and wheezing. Associated medical issues include COPD.   Presents for shortness of breath.  She had increasing cough/wheezing/shortness of breath for several days.  She was seen in the emergency department on November 4 for cough and congestion.  She was diagnosed with sinusitis and placed on Augmentin.  Since that time she been having increasing cough and shortness of breath.  She was able to receive dialysis In route she was given nebs/soluMedrol/magnesium with mild improvement Past Medical History:  Diagnosis Date  . Anal fistula   . Anemia in chronic kidney disease (CKD)   . Aortic insufficiency    Echo 3/18: mod conc LVH, EF 60-65, no RWMA, Gr 1 DD, mod AI, mild MR, normal RVSF, Trivial TR  . Arthritis   . Borderline hypertension   . Bulging disc    L3-L4  . Chronic diarrhea    due to crohn's  . CKD (chronic kidney disease), stage IV ([place])    MWF- [address]  . Crohn's disease ([place])    chronic ileitis  . Dyspnea    with exertion  . Emphysema/COPD ([place])   . GERD (gastroesophageal reflux disease)    denies  . History of blood transfusion   . History of glaucoma   . History of kidney stones   . History of pancreatitis    2008--  mercaptopurine  . History of small bowel obstruction    12-03-2010  due to crohn's ileitis  . Hypertension    no longer on medications  . Hypothyroidism, postsurgical    multinodule w/ hurthle cells    . Osteoporosis   . Perianal Crohn's disease ([place])   . Peripheral vascular disease ([place])    blood clot behind knee left leg  . Polyarthralgia   . Seizures ([place])    03/2017  . Wears partial dentures    upper    Patient Active Problem List   Diagnosis Date Noted  . Crohn's disease of colon with complication ([place])   . Aortic insufficiency 07/30/2016  . Atherosclerosis of aorta ([place]) 07/30/2016  . Pressure injury of skin 07/14/2016  . Gastroenteritis, acute 07/12/2016  . COPD (chronic obstructive pulmonary disease) ([place]) 10/12/2015  . Malnutrition of moderate degree ([place]) 09/13/2014  . history of Left leg DVT 09/10/2014  . Hypotension 09/10/2014  . Hypoglycemia 09/10/2014  . Ischiorectal abscess 09/09/2014  . Increased anion gap metabolic acidosis   . Generalized abdominal pain   . Chronic diastolic CHF (congestive heart failure) ([place])   . Hypertensive heart disease   . Secondary hyperparathyroidism ([place])   . Other specified hypothyroidism   . Right shoulder pain   . Thrombocytopenia ([place])   . Severe protein-calorie malnutrition ([place])   . Thyroid activity decreased   . Emphysema of lung ([place])   . Nephrolithiasis 03/29/2014  . Spinal stenosis of lumbar region 11/01/2013  . Bilateral leg pain 09/28/2013  . Emphysema/COPD ([place])   . Acute dyspnea  09/20/2013  . Protein-calorie malnutrition, severe ([place]) 05/14/2013  . Salmonella enteritidis 06/11/2012  . Hypocalcemia 06/10/2012  . Hypothyroidism 06/10/2012  . Anemia of chronic disease 06/09/2012  . Hypomagnesemia 06/09/2012  . ESRD on dialysis ([place]) 06/09/2012  . Dehydration 06/07/2012  . Crohn's disease without complication ([place]) 07/37/1062  . Metabolic acidosis 69/48/5462  . Hypokalemia 06/07/2012  . Nausea vomiting and diarrhea 06/07/2012  . UTI (lower urinary tract infection) 06/07/2012    Past Surgical History:  Procedure Laterality Date  . ABDOMINAL HYSTERECTOMY  1990   and  Appendectomy  . AV FISTULA PLACEMENT  Left 12/08/2017   Procedure: INSERTION OF ARTERIOVENOUS (AV) GRAFT WITH ARTEGRAFT TO LEFT UPPER ARM;  Surgeon: Conrad Red Rock, MD;  Location: [facility];  Service: Vascular;  Laterality: Left;  . BASCILIC VEIN TRANSPOSITION Left 01/09/2017   Procedure: LEFT 1ST STAGE BRACHIAL VEIN TRANSPOSITION;  Surgeon: Conrad Tomahawk, MD;  Location: [facility];  Service: Vascular;  Laterality: Left;  . BASCILIC VEIN TRANSPOSITION Left 06/16/2017   Procedure: Second Stage BASILIC VEIN TRANSPOSITION  LEFT ARM;  Surgeon: Conrad Arenac, MD;  Location: [facility];  Service: Vascular;  Laterality: Left;  . BIOPSY  03/09/2018   Procedure: BIOPSY;  Surgeon: Wilford Corner, MD;  Location: [facility];  Service: Endoscopy;;  . CHOLECYSTECTOMY    . COLON RESECTION  x3 --  1978,  1987, 1989   ILEAL RESECTION x2/   [place]  . COLONOSCOPY WITH PROPOFOL N/A 09/25/2014   Procedure: COLONOSCOPY WITH PROPOFOL;  Surgeon: Garlan Fair, MD;  Location: [facility];  Service: Endoscopy;  Laterality: N/A;  . COLONOSCOPY WITH PROPOFOL N/A 03/09/2018   Procedure: COLONOSCOPY WITH PROPOFOL;  Surgeon: Wilford Corner, MD;  [facility];  Service: Endoscopy;  Laterality: N/A;  . CYSTOSCOPY W/ URETERAL STENT PLACEMENT Right 11/19/2016   Procedure: CYSTOSCOPY WITH RIGHT RETROGRADE PYELOGRAM/ URETEROSCOPY WITH LASER AND RIGHT URETERAL STENT PLACEMENT;  Surgeon: Ardis Hughs, MD;  Location: [facility];  Service: Urology;  Laterality: Right;  . ESOPHAGOGASTRODUODENOSCOPY  03/04/2012   Procedure: ESOPHAGOGASTRODUODENOSCOPY (EGD);  Surgeon: Garlan Fair, MD;  Location[facility];  Service: Endoscopy;  Laterality: N/A;  . EXAMINATION UNDER ANESTHESIA N/A 09/12/2014   Procedure: EXAM UNDER ANESTHESIA;  Surgeon: Rolm Bookbinder, MD;  Location: [place];  Service: General;  Laterality: N/A;  . EYE SURGERY     laser  . FLEXIBLE SIGMOIDOSCOPY  03/04/2012   Procedure: FLEXIBLE SIGMOIDOSCOPY;  Surgeon: Garlan Fair, MD;   [facility];  Service: Endoscopy;  Laterality: N/A;  . GLAUCOMA SURGERY Bilateral   . HOLMIUM LASER APPLICATION Right 11/26/5007   Procedure: HOLMIUM LASER APPLICATION;  Surgeon: Ardis Hughs, MD;  Location: [facility];  Service: Urology;  Laterality: Right;  . INCISION AND DRAINAGE PERIRECTAL ABSCESS N/A 09/12/2014   Procedure: IRRIGATION AND DEBRIDEMENT PERIRECTAL ABSCESS;  Surgeon: Rolm Bookbinder, MD;  Location: [facility];  Service: General;  Laterality: N/A;  . IR FLUORO GUIDE CV LINE RIGHT  03/20/2017  . IR US GUIDE VASC ACCESS RIGHT  03/20/2017  . NEGATIVE SLEEP STUDY  2008  . PLACEMENT OF SETON N/A 12/01/2014   Procedure: PLACEMENT OF SETON;  Surgeon: [name], MD;  [facility];  Service: General;  Laterality: N/A;  . POLYPECTOMY  03/09/2018   Procedure: POLYPECTOMY;  Surgeon: Wilford Corner, MD;  [facility];  Service: Endoscopy;;  . RECTAL EXAM UNDER ANESTHESIA N/A 12/01/2014   Procedure: RECTAL EXAM UNDER ANESTHESIA;  Surgeon: Elmo Putt  Marcello Moores, MD;  Location[facility];  Service: General;  Laterality: N/A;  . TOTAL THYROIDECTOMY  10-13-2003  . TRANSTHORACIC ECHOCARDIOGRAM  07-11-2013   mild LVH,  ef 55%,  moderate AR,  mild MR and TR,  trivial PR  . TUBAL LIGATION       OB History   None      Home Medications    Prior to Admission medications   Medication Sig Start Date End Date Taking? Authorizing Provider  acetaminophen (TYLENOL) 500 MG tablet Take 500 mg by mouth every 6 (six) hours as needed for mild pain.     [provider]  Adalimumab (HUMIRA) 40 MG/0.8ML PSKT Inject 40 mg into the skin every 14 (fourteen) days.    [provider]  albuterol (PROVENTIL HFA;VENTOLIN HFA) 108 (90 Base) MCG/ACT inhaler Inhale 2 puffs into the lungs every 6 (six) hours as needed for wheezing or shortness of breath. 06/09/17   Chesley Mires, MD  alendronate (FOSAMAX) 70 MG tablet Take 70 mg by mouth every  Monday.  11/06/16   [provider]  amoxicillin-clavulanate (AUGMENTIN) 875-125 MG tablet Take 1 tablet by mouth 2 (two) times daily. 03/29/18   Deno Etienne, DO  calcitRIOL (ROCALTROL) 0.25 MCG capsule Take 0.25 mcg by mouth every Monday, Wednesday, and Friday with hemodialysis. 02/03/17   [provider]  cyanocobalamin (,VITAMIN B-12,) 1000 MCG/ML injection Inject 1,000 mcg into the muscle every 30 (thirty) days.     [provider]  Fluticasone-Umeclidin-Vilant (TRELEGY ELLIPTA) 100-62.5-25 MCG/INH AEPB Inhale 1 puff into the lungs daily. 02/27/17   Chesley Mires, MD  HYDROcodone-acetaminophen (NORCO) 5-325 MG tablet Take 1 tablet by mouth every 6 (six) hours as needed for moderate pain. 12/08/17   Dagoberto Ligas, PA-C  levothyroxine (SYNTHROID, LEVOTHROID) 125 MCG tablet Take 125 mcg by mouth daily before breakfast.     [provider]  loperamide (IMODIUM) 2 MG capsule Take 2 capsules (4 mg total) by mouth as needed for diarrhea or loose stools. 03/27/17   Caren Griffins, MD  midodrine (PROAMATINE) 5 MG tablet Take 5 mg by mouth See admin instructions. 5 mg PRIOR TO dialysis on Mon/Wed/Fri 04/30/17   [provider]  nitroGLYCERIN (NITROSTAT) 0.4 MG SL tablet Place 1 tablet (0.4 mg total) under the tongue every 5 (five) minutes as needed for chest pain. 08/19/16 06/12/17  Richardson Dopp T, PA-C  ondansetron (ZOFRAN ODT) 4 MG disintegrating tablet Take 1 tablet (4 mg total) by mouth every 8 (eight) hours as needed for nausea or vomiting. 03/29/18   Deno Etienne, DO  predniSONE (DELTASONE) 5 MG tablet Take 5 mg by mouth daily with breakfast.    [provider]  sodium bicarbonate 650 MG tablet Take 2 tablets (1,300 mg total) by mouth 2 (two) times daily. Patient taking differently: Take 1,300 mg by mouth daily.  11/21/16   Mariel Aloe, MD    Family History Family History  Problem Relation Age of Onset  . Hypertension Father   . Heart disease  Father   . Heart attack Father   . Diverticulitis Mother   . COPD Mother   . Hypertension Mother   . Heart disease Mother   . Heart attack Mother   . Heart attack Brother   . Colon cancer Brother   . Diabetes Brother   . Thyroid disease Brother     Social History Social History   Tobacco Use  . Smoking status: Former Smoker  Packs/day: 0.25    Years: 15.00    Pack years: 3.75    Types: Cigarettes    Last attempt to quit: 11/27/2001    Years since quitting: 16.3  . Smokeless tobacco: Never Used  Substance Use Topics  . Alcohol use: No  . Drug use: No     Allergies   Mercaptopurine and Remicade [infliximab]   Review of Systems Review of Systems  Unable to perform ROS: Acuity of condition  Respiratory: Positive for cough, shortness of breath and wheezing.      Physical Exam Updated Vital Signs BP 108/65   Pulse (!) 115   Temp 98.3 F (36.8 C) (Oral)   Resp 14   Ht 1.626 m (5' 4" )   Wt 45.4 kg   SpO2 100%   BMI 17.16 kg/m   Physical Exam CONSTITUTIONAL: Elderly and frail HEAD: Normocephalic/atraumatic EYES: EOMI/PERRL ENMT: Mucous membranes moist NECK: supple no meningeal signs SPINE/BACK:entire spine nontender CV: S1/S2 noted, no murmurs/rubs/gallops noted LUNGS: Tachypnea noted, wheezing noted ABDOMEN: soft, nontender NEURO: Pt is awake/alert/appropriate, moves all extremitiesx4.  No facial droop.   EXTREMITIES: pulses normal/equal, full ROM no lower extremity edema, dialysis graft left arm with thrill noted SKIN: warm, color normal PSYCH: no abnormalities of mood noted, alert and oriented to situation   ED Treatments / Results  Labs (all labs ordered are listed, but only abnormal results are displayed) Labs Reviewed  BASIC METABOLIC PANEL - Abnormal; Notable for the following components:      Result Value   Potassium 2.6 (*)    Glucose, Bld 134 (*)    Creatinine, Ser 4.50 (*)    Calcium 8.5 (*)    GFR calc non Af Amer 9 (*)    GFR calc  Af Amer 10 (*)    All other components within normal limits  CBC WITH DIFFERENTIAL/PLATELET - Abnormal; Notable for the following components:   RBC 3.33 (*)    Hemoglobin 10.3 (*)    HCT 35.0 (*)    MCV 105.1 (*)    MCHC 29.4 (*)    Platelets 122 (*)    All other components within normal limits  URINALYSIS, ROUTINE W REFLEX MICROSCOPIC  LIPASE, BLOOD  HEPATIC FUNCTION PANEL    EKG EKG Interpretation  Date/Time:  Wednesday March 31 2018 01:41:37 EST Ventricular Rate:  119 PR Interval:    QRS Duration: 88 QT Interval:  320 QTC Calculation: 451 R Axis:   79 Text Interpretation:  Sinus tachycardia Right atrial enlargement Anterior infarct, old Artifact in lead(s) I II III aVR aVL V1 Interpretation limited secondary to artifact no change when compared to EMS EKG Confirmed by Ripley Fraise [phone]) on 03/31/2018 2:09:15 AM   Radiology Dg Chest 2 View  Result Date: 03/29/2018 CLINICAL DATA:  Cough EXAM: CHEST - 2 VIEW COMPARISON:  05/04/2017 FINDINGS: Interstitial coarsening and hyperinflation. There is no edema, consolidation, effusion, or pneumothorax. Normal heart size and mediastinal contours. Venous stent at the left axilla. Changes of probable parathyroidectomy sub. IMPRESSION: COPD without acute superimposed finding. Electronically Signed   By: Monte Fantasia M.D.   On: 03/29/2018 12:01   Dg Chest Port 1 View  Result Date: 03/31/2018 CLINICAL DATA:  Shortness of breath tonight EXAM: PORTABLE CHEST 1 VIEW COMPARISON:  03/29/2018 FINDINGS: Emphysematous changes in the lungs. Central interstitial pattern consistent with chronic bronchitis. No airspace disease or consolidation. No blunting of costophrenic angles. No pneumothorax. Mediastinal contours appear intact. Normal heart size and pulmonary vascularity. Surgical clips in  the base of the neck. No change since prior study. IMPRESSION: Emphysematous and chronic bronchitic changes in the lungs. No evidence of active pulmonary  disease. Electronically Signed   By: Lucienne Capers M.D.   On: 03/31/2018 02:03    Procedures Procedures  CRITICAL CARE Performed by: Sharyon Cable Total critical care time: 31 minutes Critical care time was exclusive of separately billable procedures and treating other patients. Critical care was necessary to treat or prevent imminent or life-threatening deterioration. Critical care was time spent personally by me on the following activities: development of treatment plan with patient and/or surrogate as well as nursing, discussions with consultants, evaluation of patient's response to treatment, examination of patient, obtaining history from patient or surrogate, ordering and performing treatments and interventions, ordering and review of laboratory studies, ordering and review of radiographic studies, pulse oximetry and re-evaluation of patient's condition.   Medications Ordered in ED Medications  potassium chloride SA (K-DUR,KLOR-CON) CR tablet 40 mEq (has no administration in time range)  albuterol (PROVENTIL) (2.5 MG/3ML) 0.083% nebulizer solution 5 mg (5 mg Nebulization Given 03/31/18 0208)  albuterol (PROVENTIL,VENTOLIN) solution continuous neb (10 mg/hr Nebulization Given 03/31/18 0311)  fentaNYL (SUBLIMAZE) injection 100 mcg (100 mcg Intravenous Given 03/31/18 0410)  ondansetron (ZOFRAN) 4 MG/2ML injection (4 mg  Given 03/31/18 0409)     Initial Impression / Assessment and Plan / ED Course  I have reviewed the triage vital signs and the nursing notes.  Pertinent labs & imaging results that were available during my care of the patient were reviewed by me and considered in my medical decision making (see chart for details).     2:20 AM Patient presents with worsening shortness of breath, likely COPD exacerbation.  Chest x-ray reviewed and is unchanged.  Patient has been given nebs and will reassess 3:02 AM Patient with some improvement, but still feeling short of breath.   Hour-long nebulizer ordered 3:54 AM Patient now reporting abdominal pain.  She reports a history of Crohn's.  She reports it seems to be worse with the nebulizer.  Denies any vomiting/diarrhea.  No blood in her stools.  Her abdomen is soft on palpation.  Will treat pain and reassess 5:11 AM Patient reports abdominal pain is improved.  However she is still having some mild shortness of breath.  I feel she is to be admitted for COPD exacerbation and will need to have her potassium replenished.  Will defer any imaging at this time for abdominal pain, this can be observed in the hospital.  I did add on a urinalysis, LFT/lipase.    D/w dr opyd for admission  Final Clinical Impressions(s) / ED Diagnoses   Final diagnoses:  COPD exacerbation ([place])  Hypokalemia    ED Discharge Orders    None       Ripley Fraise, MD 03/31/18 [phone]

## 2018-03-31 NOTE — H&P (Signed)
History and Physical    VIVIEN BARRETTO LFY:[id number] DOB: 06-02-1945 DOA: 03/31/2018  PCP: [name], MD   Patient coming from: Home   Chief Complaint: SOB, coughing   HPI: Natasha [name] is a 72 y.o. female with medical history significant for Crohn's disease, ESRD on HD, hypothyroidism, COPD, and started on antibiotics 2 days ago for acute sinusitis, now presenting to the emergency department for evaluation of progressive shortness of breath and cough.  Patient reports that she developed these symptoms about a month ago, was treated with a short course of antibiotics, had some improvement, but then began to worsen again a week ago with subjective fevers, rhinorrhea, sinus and chest congestion, cough, and shortness of breath.  She was seen in the emergency department 2 days ago for this, found to be tender over the left maxillary sinus, and was started on Augmentin for acute sinusitis.  Facial pain and sinus congestion has improved some, but her shortness of breath, cough, and wheezing has continued to worsen, becoming severe yesterday evening.  She denies any chest pain, leg swelling, or leg tenderness associated with this.  She has not been having abdominal pain, but reports chronic diarrhea and recent nausea with couple episodes of nonbloody vomiting that has since resolved.  She called EMS overnight due to her worsening cough and dyspnea, was treated with 125 mg of Solu-Medrol, 2 g IV magnesium, and DuoNeb x2 prior to arrival in the ED.  ED Course: Upon arrival to the ED, patient is found to be afebrile, saturating well on room air, tachycardic in the 110s, and with stable blood pressure.  EKG features sinus tachycardia with rate 119.  Chest x-ray is notable for emphysematous and chronic bronchitic changes, but no acute finding.  Chemistry panel is notable for potassium of 2.6, normal bicarbonate, and normal BUN.  CBC features a stable anemia.  Patient developed some abdominal  pain while in the ED, pain resolved with fentanyl, and LFTs and lipase were ordered.  She was also given 40 mEq oral potassium and 2 continuous albuterol nebs in the ED.  She reports some improvement in her breathing, but remains tachypneic and dyspneic at rest and will be observed for ongoing evaluation and management of COPD with acute exacerbation.  Review of Systems:  All other systems reviewed and apart from HPI, are negative.  Past Medical History:  Diagnosis Date  . Anal fistula   . Anemia in chronic kidney disease (CKD)   . Aortic insufficiency    Echo 3/18: mod conc LVH, EF 60-65, no RWMA, Gr 1 DD, mod AI, mild MR, normal RVSF, Trivial TR  . Arthritis   . Borderline hypertension   . Bulging disc    L3-L4  . Chronic diarrhea    due to crohn's  . CKD (chronic kidney disease), stage IV ([place])    MWF- [address]  . Crohn's disease ([place])    chronic ileitis  . Dyspnea    with exertion  . Emphysema/COPD ([place])   . GERD (gastroesophageal reflux disease)    denies  . History of blood transfusion   . History of glaucoma   . History of kidney stones   . History of pancreatitis    2008--  mercaptopurine  . History of small bowel obstruction    12-03-2010  due to crohn's ileitis  . Hypertension    no longer on medications  . Hypothyroidism, postsurgical    multinodule w/ hurthle cells  . Osteoporosis   .  Perianal Crohn's disease ([place])   . Peripheral vascular disease ([place])    blood clot behind knee left leg  . Polyarthralgia   . Seizures ([place])    03/2017  . Wears partial dentures    upper    Past Surgical History:  Procedure Laterality Date  . ABDOMINAL HYSTERECTOMY  1990   and  Appendectomy  . AV FISTULA PLACEMENT Left 12/08/2017   Procedure: INSERTION OF ARTERIOVENOUS (AV) GRAFT WITH ARTEGRAFT TO LEFT UPPER ARM;  Surgeon: Conrad Meeteetse, MD;  Location: [facility];  Service: Vascular;  Laterality: Left;  . BASCILIC VEIN TRANSPOSITION Left 01/09/2017   Procedure: LEFT 1ST  STAGE BRACHIAL VEIN TRANSPOSITION;  Surgeon: Conrad [place], MD;  Location: [facility];  Service: Vascular;  Laterality: Left;  . BASCILIC VEIN TRANSPOSITION Left 06/16/2017   Procedure: Second Stage BASILIC VEIN TRANSPOSITION  LEFT ARM;  Surgeon: Conrad [place], MD;  Location: [facility];  Service: Vascular;  Laterality: Left;  . BIOPSY  03/09/2018   Procedure: BIOPSY;  Surgeon: Wilford Corner, MD;  Location: [facility];  Service: Endoscopy;;  . CHOLECYSTECTOMY    . COLON RESECTION  x3 --  1978,  1987, 1989   ILEAL RESECTION x2/   [place]  . COLONOSCOPY WITH PROPOFOL N/A 09/25/2014   Procedure: COLONOSCOPY WITH PROPOFOL;  Surgeon: Garlan Fair, MD;  Location: [facility];  Service: Endoscopy;  Laterality: N/A;  . COLONOSCOPY WITH PROPOFOL N/A 03/09/2018   Procedure: COLONOSCOPY WITH PROPOFOL;  Surgeon: Wilford Corner, MD;  [facility]: [facility];  Service: Endoscopy;  Laterality: N/A;  . CYSTOSCOPY W/ URETERAL STENT PLACEMENT Right 11/19/2016   Procedure: CYSTOSCOPY WITH RIGHT RETROGRADE PYELOGRAM/ URETEROSCOPY WITH LASER AND RIGHT URETERAL STENT PLACEMENT;  Surgeon: Ardis Hughs, MD;  Location: [facility];  Service: Urology;  Laterality: Right;  . ESOPHAGOGASTRODUODENOSCOPY  03/04/2012   Procedure: ESOPHAGOGASTRODUODENOSCOPY (EGD);  Surgeon: Garlan Fair, MD;  Location: [facility];  Service: Endoscopy;  Laterality: N/A;  . EXAMINATION UNDER ANESTHESIA N/A 09/12/2014   Procedure: EXAM UNDER ANESTHESIA;  Surgeon: Rolm Bookbinder, MD;  Location: [facility];  Service: General;  Laterality: N/A;  . EYE SURGERY     laser  . FLEXIBLE SIGMOIDOSCOPY  03/04/2012   Procedure: FLEXIBLE SIGMOIDOSCOPY;  Surgeon: Garlan Fair, MD;  [facility];  Service: Endoscopy;  Laterality: N/A;  . GLAUCOMA SURGERY Bilateral   . HOLMIUM LASER APPLICATION Right 08/06/9700   Procedure: HOLMIUM LASER APPLICATION;  Surgeon: Ardis Hughs, MD;  Location: [facility];  Service: Urology;   Laterality: Right;  . INCISION AND DRAINAGE PERIRECTAL ABSCESS N/A 09/12/2014   Procedure: IRRIGATION AND DEBRIDEMENT PERIRECTAL ABSCESS;  Surgeon: Rolm Bookbinder, MD;  Location: [facility];  Service: General;  Laterality: N/A;  . IR FLUORO GUIDE CV LINE RIGHT  03/20/2017  . IR US GUIDE VASC ACCESS RIGHT  03/20/2017  . NEGATIVE SLEEP STUDY  2008  . PLACEMENT OF SETON N/A 12/01/2014   Procedure: PLACEMENT OF SETON;  Surgeon: [name], MD;  [facility];  Service: General;  Laterality: N/A;  . POLYPECTOMY  03/09/2018   Procedure: POLYPECTOMY;  Surgeon: Wilford Corner, MD;  [facility];  Service: Endoscopy;;  . RECTAL EXAM UNDER ANESTHESIA N/A 12/01/2014   Procedure: RECTAL EXAM UNDER ANESTHESIA;  Surgeon: [name], MD;  Location: [facility];  Service: General;  Laterality: N/A;  . TOTAL THYROIDECTOMY  10-13-2003  . TRANSTHORACIC ECHOCARDIOGRAM  07-11-2013   mild LVH,  ef 55%,  moderate AR,  mild MR and TR,  trivial PR  . TUBAL LIGATION       reports that she quit smoking about 16 years ago. Her smoking use included cigarettes. She has a 3.75 pack-year smoking history. She has never used smokeless tobacco. She reports that she does not drink alcohol or use drugs.  Allergies  Allergen Reactions  . Mercaptopurine Other (See Comments)    Caused pancreatitis  . Remicade [Infliximab] Other (See Comments)    CAUSED JOINT PAIN    Family History  Problem Relation Age of Onset  . Hypertension Father   . Heart disease Father   . Heart attack Father   . Diverticulitis Mother   . COPD Mother   . Hypertension Mother   . Heart disease Mother   . Heart attack Mother   . Heart attack Brother   . Colon cancer Brother   . Diabetes Brother   . Thyroid disease Brother      Prior to Admission medications   Medication Sig Start Date End Date Taking? Authorizing Provider  acetaminophen (TYLENOL) 500 MG tablet Take 500 mg by mouth every 6  (six) hours as needed for mild pain.    Yes [provider]  Adalimumab (HUMIRA) 40 MG/0.8ML PSKT Inject 40 mg into the skin every 14 (fourteen) days.   Yes [provider]  albuterol (PROVENTIL HFA;VENTOLIN HFA) 108 (90 Base) MCG/ACT inhaler Inhale 2 puffs into the lungs every 6 (six) hours as needed for wheezing or shortness of breath. 06/09/17  Yes Chesley Mires, MD  alendronate (FOSAMAX) 70 MG tablet Take 70 mg by mouth every Monday.  11/06/16  Yes [provider]  amoxicillin-clavulanate (AUGMENTIN) 875-125 MG tablet Take 1 tablet by mouth 2 (two) times daily. 03/29/18  Yes Deno Etienne, DO  calcitRIOL (ROCALTROL) 0.25 MCG capsule Take 0.25 mcg by mouth every Monday, Wednesday, and Friday with hemodialysis. 02/03/17  Yes [provider]  cyanocobalamin (,VITAMIN B-12,) 1000 MCG/ML injection Inject 1,000 mcg into the muscle every 30 (thirty) days.    Yes [provider]  Fluticasone-Umeclidin-Vilant (TRELEGY ELLIPTA) 100-62.5-25 MCG/INH AEPB Inhale 1 puff into the lungs daily. 02/27/17  Yes Chesley Mires, MD  HYDROcodone-acetaminophen (NORCO) 5-325 MG tablet Take 1 tablet by mouth every 6 (six) hours as needed for moderate pain. 12/08/17  Yes Dagoberto Ligas, PA-C  levothyroxine (SYNTHROID, LEVOTHROID) 125 MCG tablet Take 125 mcg by mouth daily before breakfast.    Yes [provider]  loperamide (IMODIUM) 2 MG capsule Take 2 capsules (4 mg total) by mouth as needed for diarrhea or loose stools. 03/27/17  Yes Gherghe, Vella Redhead, MD  midodrine (PROAMATINE) 5 MG tablet Take 5 mg by mouth See admin instructions. 5 mg PRIOR TO dialysis on Mon/Wed/Fri 04/30/17  Yes [provider]  nitroGLYCERIN (NITROSTAT) 0.4 MG SL tablet Place 1 tablet (0.4 mg total) under the tongue every 5 (five) minutes as needed for chest pain. 08/19/16 03/31/26 Yes Weaver, Scott T, PA-C  ondansetron (ZOFRAN ODT) 4 MG disintegrating tablet Take 1 tablet (4 mg total) by mouth every 8  (eight) hours as needed for nausea or vomiting. 03/29/18  Yes Deno Etienne, DO  predniSONE (DELTASONE) 5 MG tablet Take 5 mg by mouth daily with breakfast.   Yes [provider]  sodium bicarbonate 650 MG tablet Take 2 tablets (1,300 mg total) by mouth 2 (two) times daily. Patient taking differently: Take 1,300 mg by mouth daily.  11/21/16  Yes Mariel Aloe, MD  Physical Exam: Vitals:   03/31/18 0330 03/31/18 0400 03/31/18 0430 03/31/18 0500  BP: 133/63 108/63 109/60 (!) 117/54  Pulse: (!) 117 (!) 121 (!) 137 (!) 110  Resp: 19 18 (!) 21 (!) 21  Temp:      TempSrc:      SpO2: 100% 100% 100% 99%  Weight:      Height:        Constitutional: NAD, calm, frail  Eyes: PERTLA, lids and conjunctivae normal ENMT: Mucous membranes are moist. Posterior pharynx clear of any exudate or lesions.   Neck: normal, supple, no masses, no thyromegaly Respiratory: Diminished bilaterally with prolonged expiratory phase and frequent cough. No pallor or cyanosis. No accessory muscle use.  Cardiovascular: Rate ~120 and regular. No extremity edema.   Abdomen: No distension, no tenderness, soft. Bowel sounds active.  Musculoskeletal: no clubbing / cyanosis. No joint deformity upper and lower extremities.   Skin: no significant rashes, lesions, ulcers. Warm, dry, well-perfused. Neurologic: No facial asymmetry. Sensation intact. Moving all extremities.  Psychiatric: Alert and oriented x 3. Pleasant and cooperative.    Labs on Admission: I have personally reviewed following labs and imaging studies  CBC: Recent Labs  Lab 03/29/18 1153 03/31/18 0209  WBC 8.3 6.3  NEUTROABS 5.8 3.8  HGB 10.3* 10.3*  HCT 35.2* 35.0*  MCV 105.4* 105.1*  PLT 159 016*   Basic Metabolic Panel: Recent Labs  Lab 03/29/18 1153 03/31/18 0209  NA 140 140  K 2.7* 2.6*  CL 111 101  CO2 24 29  GLUCOSE 86 134*  BUN 34* 15  CREATININE 7.74* 4.50*  CALCIUM 8.6* 8.5*   GFR: Estimated Creatinine Clearance: 8.2  mL/min (A) (by C-G formula based on SCr of 4.5 mg/dL (H)). Liver Function Tests: Recent Labs  Lab 03/29/18 1153  AST 13*  ALT 10  ALKPHOS 63  BILITOT 0.7  PROT 6.6  ALBUMIN 3.1*   No results for input(s): LIPASE, AMYLASE in the last 168 hours. No results for input(s): AMMONIA in the last 168 hours. Coagulation Profile: No results for input(s): INR, PROTIME in the last 168 hours. Cardiac Enzymes: No results for input(s): CKTOTAL, CKMB, CKMBINDEX, TROPONINI in the last 168 hours. BNP (last 3 results) No results for input(s): PROBNP in the last 8760 hours. HbA1C: No results for input(s): HGBA1C in the last 72 hours. CBG: No results for input(s): GLUCAP in the last 168 hours. Lipid Profile: No results for input(s): CHOL, HDL, LDLCALC, TRIG, CHOLHDL, LDLDIRECT in the last 72 hours. Thyroid Function Tests: No results for input(s): TSH, T4TOTAL, FREET4, T3FREE, THYROIDAB in the last 72 hours. Anemia Panel: No results for input(s): VITAMINB12, FOLATE, FERRITIN, TIBC, IRON, RETICCTPCT in the last 72 hours. Urine analysis:    Component Value Date/Time   COLORURINE YELLOW 03/19/2017 1934   APPEARANCEUR HAZY (A) 03/19/2017 1934   LABSPEC 1.006 03/19/2017 1934   PHURINE 5.0 03/19/2017 1934   GLUCOSEU NEGATIVE 03/19/2017 1934   HGBUR MODERATE (A) 03/19/2017 [name] NEGATIVE 03/19/2017 [place] 03/19/2017 1934   PROTEINUR 30 (A) 03/19/2017 1934   UROBILINOGEN 0.2 09/09/2014 0850   NITRITE NEGATIVE 03/19/2017 1934   LEUKOCYTESUR NEGATIVE 03/19/2017 1934   Sepsis Labs: @LABRCNTIP (procalcitonin:4,lacticidven:4) )No results found for this or any previous visit (from the past 240 hour(s)).   Radiological Exams on Admission: Dg Chest 2 View  Result Date: 03/29/2018 CLINICAL DATA:  Cough EXAM: CHEST - 2 VIEW COMPARISON:  05/04/2017 FINDINGS: Interstitial coarsening and hyperinflation. There is no  edema, consolidation, effusion, or pneumothorax. Normal heart  size and mediastinal contours. Venous stent at the left axilla. Changes of probable parathyroidectomy sub. IMPRESSION: COPD without acute superimposed finding. Electronically Signed   By: Monte Fantasia M.D.   On: 03/29/2018 12:01   Dg Chest Port 1 View  Result Date: 03/31/2018 CLINICAL DATA:  Shortness of breath tonight EXAM: PORTABLE CHEST 1 VIEW COMPARISON:  03/29/2018 FINDINGS: Emphysematous changes in the lungs. Central interstitial pattern consistent with chronic bronchitis. No airspace disease or consolidation. No blunting of costophrenic angles. No pneumothorax. Mediastinal contours appear intact. Normal heart size and pulmonary vascularity. Surgical clips in the base of the neck. No change since prior study. IMPRESSION: Emphysematous and chronic bronchitic changes in the lungs. No evidence of active pulmonary disease. Electronically Signed   By: Lucienne Capers M.D.   On: 03/31/2018 02:03    EKG: Independently reviewed. Sinus tachycardia (rate 119).   Assessment/Plan   1. COPD with acute exacerbation  - Presents with progressive SOB and cough, treated with 125 mg IV Solu-Medrol, DuoNeb x2, and 2 g IV mag with EMS prior to arrival  - Given continuous albuterol treatments x2 in ED, reports improvement with this, but remains dyspneic at rest  - PE considered given hx of DVT and tachycardia, but tachycardia more likely secondary to albuterol, there is no chest pain or hypoxia, and no leg swelling or tenderness  - Check sputum culture, continue systemic steroid, continue Augmentin, continue ICS/LAMA/LABA, and continue albuterol nebs    2. Hypokalemia  - Serum potassium is 2.6 on admission in setting of chronic diarrhea, recent vomiting, and SABA use  - Treated with 40 mEq oral and 10 mEq IV potassium; was given 2 g IV magnesium by EMS pta  - Continue cardiac monitoring, repeat chem panel tomorrow    3. ESRD  - Typically on MWF schedule for HD but got off of schedule after missing and  recent session and was dialyzed yesterday (11/5)  - No hyperkalemia, acidosis, uremia, HTN, or hypervolemia on admission  - SLIV, continue bicarbonate, renally-dose medications   4. Acute sinusitis  - Seen in ED on 11/4 with cough, congestion, rhinorrhea, was found to have tenderness over the left maxillary sinus, and was started on Augmentin  - Continue Augmentin    5. Hypothyroidism  - Continue Synthroid   6. Chron's colitis  - Developed some abdominal pain while in ED, has benign exam, and LFT's and lipase were ordered  - She is followed by GI, managed with Humira and daily low-dose prednisone, currently on IV Solu-Medrol as above     DVT prophylaxis: sq heparin  Code Status: Full  Family Communication: Discussed with patient  Consults called: None Admission status: Observation     Vianne Bulls, MD [facility] Pager [phone]  If 7PM-7AM, please contact night-coverage [email or web address] Password [email or web address]  03/31/2018, 5:12 AM

## 2018-03-31 NOTE — Progress Notes (Signed)
PHARMACY NOTE:  ANTIMICROBIAL RENAL DOSAGE ADJUSTMENT  Current antimicrobial regimen includes a mismatch between antimicrobial dosage and estimated renal function.  As per policy approved by the Pharmacy & Therapeutics and Medical Executive Committees, the antimicrobial dosage will be adjusted accordingly.  Current antimicrobial dosage: Augmentin 885m q12  Indication: COPD exacerbation/Sinusitis  Renal Function:  Estimated Creatinine Clearance: 8.2 mL/min (A) (by C-G formula based on SCr of 4.5 mg/dL (H)). []      On intermittent HD, scheduled: []      On CRRT    Antimicrobial dosage has been changed to:  Augmentin 5021mPO q24h  Additional comments:   Desera Graffeo A. PiLevada DyPharmD, BCPayne Gapager: [phone] utilize Amion for appropriate phone number to reach the unit pharmacist (MCSecretary  03/31/2018 7:45 AM

## 2018-03-31 NOTE — Consult Note (Signed)
Reason for Consult: To manage dialysis and dialysis related needs Referring Physician: Dr. Eliseo Squires  HPI: 12 AAF with ESRD on HD, Crohn's disease, COPD who is currently admitted with COPD exacerbation and is seen for ESRD related care.   She has baseline COPD but is not on home O2.  She tailgated this weekend at football game, next to large food smokers.  Developed difficulty breathing - treated in ED Monday, discharged home.  Dialyzed Tuesday but Tues PM noted acute worsening prompting EMS call.  Transported to [facility] and admitted for COPD - CXR clear.  Being treated with IV steroids and nebs.   She had thrombectomy x 2 last week of her AVG.  HD yesterday (off MWF schedule due to thrombectomies and travel) and no issues with AVG.  Appetite has been poor in past week.    She overall is feeling a bit better but still not to baseline.    Past Medical History:  Diagnosis Date  . Anal fistula   . Anemia in chronic kidney disease (CKD)   . Aortic insufficiency    Echo 3/18: mod conc LVH, EF 60-65, no RWMA, Gr 1 DD, mod AI, mild MR, normal RVSF, Trivial TR  . Arthritis   . Borderline hypertension   . Bulging disc    L3-L4  . Chronic diarrhea    due to crohn's  . CKD (chronic kidney disease), stage IV ([place])    MWF- [address]  . Crohn's disease ([place])    chronic ileitis  . Dyspnea    with exertion  . Emphysema/COPD ([place])   . GERD (gastroesophageal reflux disease)    denies  . History of blood transfusion   . History of glaucoma   . History of kidney stones   . History of pancreatitis    2008--  mercaptopurine  . History of small bowel obstruction    12-03-2010  due to crohn's ileitis  . Hypertension    no longer on medications  . Hypothyroidism, postsurgical    multinodule w/ hurthle cells  . Osteoporosis   . Perianal Crohn's disease ([place])   . Peripheral vascular disease ([place])    blood clot behind knee left leg  . Polyarthralgia   . Seizures ([place])    03/2017  . Wears partial  dentures    upper    Past Surgical History:  Procedure Laterality Date  . ABDOMINAL HYSTERECTOMY  1990   and  Appendectomy  . AV FISTULA PLACEMENT Left 12/08/2017   Procedure: INSERTION OF ARTERIOVENOUS (AV) GRAFT WITH ARTEGRAFT TO LEFT UPPER ARM;  Surgeon: Conrad Niland, MD;  Location: [place];  Service: Vascular;  Laterality: Left;  . BASCILIC VEIN TRANSPOSITION Left 01/09/2017   Procedure: LEFT 1ST STAGE BRACHIAL VEIN TRANSPOSITION;  Surgeon: Conrad Sch[place], MD;  Location: [place];  Service: Vascular;  Laterality: Left;  . BASCILIC VEIN TRANSPOSITION Left 06/16/2017   Procedure: Second Stage BASILIC VEIN TRANSPOSITION  LEFT ARM;  Surgeon: Conrad Fairmount, MD;  Location: [place];  Service: Vascular;  Laterality: Left;  . BIOPSY  03/09/2018   Procedure: BIOPSY;  Surgeon: Wilford Corner, MD;  [facility];  Service: Endoscopy;;  . CHOLECYSTECTOMY    . COLON RESECTION  x3 --  1978,  1987, 1989   ILEAL RESECTION x2/   [place]  . COLONOSCOPY WITH PROPOFOL N/A 09/25/2014   Procedure: COLONOSCOPY WITH PROPOFOL;  Surgeon: Garlan Fair, MD;  [facility];  Service: Endoscopy;  Laterality: N/A;  .  COLONOSCOPY WITH PROPOFOL N/A 03/09/2018   Procedure: COLONOSCOPY WITH PROPOFOL;  Surgeon: Wilford Corner, MD;  [facility];  Service: Endoscopy;  Laterality: N/A;  . CYSTOSCOPY W/ URETERAL STENT PLACEMENT Right 11/19/2016   Procedure: CYSTOSCOPY WITH RIGHT RETROGRADE PYELOGRAM/ URETEROSCOPY WITH LASER AND RIGHT URETERAL STENT PLACEMENT;  Surgeon: Ardis Hughs, MD;  [facility];  Service: Urology;  Laterality: Right;  . ESOPHAGOGASTRODUODENOSCOPY  03/04/2012   Procedure: ESOPHAGOGASTRODUODENOSCOPY (EGD);  Surgeon: Garlan Fair, MD;  Location[facility];  Service: Endoscopy;  Laterality: N/A;  . EXAMINATION UNDER ANESTHESIA N/A 09/12/2014   Procedure: EXAM UNDER ANESTHESIA;  Surgeon: Rolm Bookbinder, MD;  Location: [place];  Service: General;   Laterality: N/A;  . EYE SURGERY     laser  . FLEXIBLE SIGMOIDOSCOPY  03/04/2012   Procedure: FLEXIBLE SIGMOIDOSCOPY;  Surgeon: Garlan Fair, MD;  [facility];  Service: Endoscopy;  Laterality: N/A;  . GLAUCOMA SURGERY Bilateral   . HOLMIUM LASER APPLICATION Right 11/13/3084   Procedure: HOLMIUM LASER APPLICATION;  Surgeon: Ardis Hughs, MD;  [facility];  Service: Urology;  Laterality: Right;  . INCISION AND DRAINAGE PERIRECTAL ABSCESS N/A 09/12/2014   Procedure: IRRIGATION AND DEBRIDEMENT PERIRECTAL ABSCESS;  Surgeon: Rolm Bookbinder, MD;  Location: [place];  Service: General;  Laterality: N/A;  . IR FLUORO GUIDE CV LINE RIGHT  03/20/2017  . IR US GUIDE VASC ACCESS RIGHT  03/20/2017  . NEGATIVE SLEEP STUDY  2008  . PLACEMENT OF SETON N/A 12/01/2014   Procedure: PLACEMENT OF SETON;  Surgeon: [name], MD;  Location: [facility];  Service: General;  Laterality: N/A;  . POLYPECTOMY  03/09/2018   Procedure: POLYPECTOMY;  Surgeon: Wilford Corner, MD;  [facility];  Service: Endoscopy;;  . RECTAL EXAM UNDER ANESTHESIA N/A 12/01/2014   Procedure: RECTAL EXAM UNDER ANESTHESIA;  Surgeon: [name], MD;  Location: [facility];  Service: General;  Laterality: N/A;  . TOTAL THYROIDECTOMY  10-13-2003  . TRANSTHORACIC ECHOCARDIOGRAM  07-11-2013   mild LVH,  ef 55%,  moderate AR,  mild MR and TR,  trivial PR  . TUBAL LIGATION      Family History  Problem Relation Age of Onset  . Hypertension Father   . Heart disease Father   . Heart attack Father   . Diverticulitis Mother   . COPD Mother   . Hypertension Mother   . Heart disease Mother   . Heart attack Mother   . Heart attack Brother   . Colon cancer Brother   . Diabetes Brother   . Thyroid disease Brother     Social History:  reports that she quit smoking about 16 years ago. Her smoking use included cigarettes. She has a 3.75 pack-year smoking history. She has never  used smokeless tobacco. She reports that she does not drink alcohol or use drugs.  Allergies:  Allergies  Allergen Reactions  . Mercaptopurine Other (See Comments)    Caused pancreatitis  . Remicade [Infliximab] Other (See Comments)    CAUSED JOINT PAIN    Results for orders placed or performed during the hospital encounter of 03/31/18 (from the past 48 hour(s))  Basic metabolic panel     Status: Abnormal   Collection Time: 03/31/18  2:09 AM  Result Value Ref Range   Sodium 140 135 - 145 mmol/L   Potassium 2.6 (LL) 3.5 - 5.1 mmol/L    Comment: CRITICAL RESULT CALLED TO, READ BACK BY AND VERIFIED WITH: W.VHQIONG,EX  0308 03/31/18 G.MCADOO    Chloride 101 98 - 111 mmol/L   CO2 29 22 - 32 mmol/L   Glucose, Bld 134 (H) 70 - 99 mg/dL   BUN 15 8 - 23 mg/dL   Creatinine, Ser 4.50 (H) 0.44 - 1.00 mg/dL    Comment: DIALYSIS   Calcium 8.5 (L) 8.9 - 10.3 mg/dL   GFR calc non Af Amer 9 (L) >60 mL/min   GFR calc Af Amer 10 (L) >60 mL/min    Comment: (NOTE) The eGFR has been calculated using the CKD EPI equation. This calculation has not been validated in all clinical situations. eGFR's persistently <60 mL/min signify possible Chronic Kidney Disease.    Anion gap 10 5 - 15    Comment: Performed at [facility] [address] [place], [place] [address]  CBC with Differential/Platelet     Status: Abnormal   Collection Time: 03/31/18  2:09 AM  Result Value Ref Range   WBC 6.3 4.0 - 10.5 K/uL   RBC 3.33 (L) 3.87 - 5.11 MIL/uL   Hemoglobin 10.3 (L) 12.0 - 15.0 g/dL   HCT 35.0 (L) 36.0 - 46.0 %   MCV 105.1 (H) 80.0 - 100.0 fL   MCH 30.9 26.0 - 34.0 pg   MCHC 29.4 (L) 30.0 - 36.0 g/dL   RDW 14.7 11.5 - 15.5 %   Platelets 122 (L) 150 - 400 K/uL   nRBC 0.0 0.0 - 0.2 %   Neutrophils Relative % 61 %   Neutro Abs 3.8 1.7 - 7.7 K/uL   Lymphocytes Relative 25 %   Lymphs Abs 1.6 0.7 - 4.0 K/uL   Monocytes Relative 9 %   Monocytes Absolute 0.6 0.1 - 1.0 K/uL   Eosinophils Relative 4 %    Eosinophils Absolute 0.3 0.0 - 0.5 K/uL   Basophils Relative 0 %   Basophils Absolute 0.0 0.0 - 0.1 K/uL   Immature Granulocytes 1 %   Abs Immature Granulocytes 0.03 0.00 - 0.07 K/uL    Comment: Performed at [facility], [address] [place], [place] [address]  Lipase, blood     Status: Abnormal   Collection Time: 03/31/18  2:09 AM  Result Value Ref Range   Lipase 52 (H) 11 - 51 U/L    Comment: Performed at [facility], [place] [address] [place], [place] [address]  Hepatic function panel     Status: Abnormal   Collection Time: 03/31/18  2:09 AM  Result Value Ref Range   Total Protein 6.6 6.5 - 8.1 g/dL   Albumin 3.0 (L) 3.5 - 5.0 g/dL   AST 19 15 - 41 U/L   ALT 10 0 - 44 U/L   Alkaline Phosphatase 65 38 - 126 U/L   Total Bilirubin 0.5 0.3 - 1.2 mg/dL   Bilirubin, Direct <0.1 0.0 - 0.2 mg/dL   Indirect Bilirubin NOT CALCULATED 0.3 - 0.9 mg/dL    Comment: Performed at [facility] [address] [place], [place] [address]  Magnesium     Status: Abnormal   Collection Time: 03/31/18  2:09 AM  Result Value Ref Range   Magnesium 3.1 (H) 1.7 - 2.4 mg/dL    Comment: Performed at [facility] [address], [place], [place] [address]  Glucose, capillary     Status: Abnormal   Collection Time: 03/31/18  8:17 AM  Result Value Ref Range   Glucose-Capillary 162 (H) 70 - 99 mg/dL  Dg Chest Port 1 View  Result Date: 03/31/2018 CLINICAL DATA:  Shortness of breath tonight EXAM: PORTABLE CHEST 1 VIEW COMPARISON:  03/29/2018 FINDINGS: Emphysematous changes in the lungs. Central interstitial pattern consistent with chronic bronchitis. No airspace disease or consolidation. No blunting of costophrenic angles. No pneumothorax. Mediastinal contours appear intact. Normal heart size and pulmonary vascularity. Surgical clips in the base of the neck. No change since prior study. IMPRESSION: Emphysematous and chronic bronchitic changes in the lungs. No evidence of active  pulmonary disease. Electronically Signed   By: Lucienne Capers M.D.   On: 03/31/2018 02:03    ROS: 12 system ROS per HPI listed above  Blood pressure (!) 100/57, pulse (!) 107, temperature 98.2 F (36.8 C), temperature source Oral, resp. rate 14, height 5' 4"  (1.626 m), weight 45.4 kg, SpO2 100 %. Gen: thin woman who is seated and in no distress Eyes: anicteric ENT: MMM CV: RRR, II/VI syst mumrur Lungs: slightly inc WOB while talking, dec BS throughout, no wheezes or rales Abd: soft nontender Extr: no edema, LUE AVG +t/b Neuro: nonfocal Psych: cheerful despite circumstances  Dialyzes at [facility] MWF 4hr 15 min EDW 45.5kg. HD Bath 4/2.25, Dialyzer 180, Heparin 4200 IV bolus. Access AVG. venofer 50 weekly mircera 75 qow last dose 10/30 Calcitriol 1.75 TIW  Assessment/Plan: 1 COPD exacerbation: care per primary.  Does not appear volume is a contributor to pulm compromise at this time.   2 ESRD: HD MWF [place].  Off schedule = dialyzed yesterday, will do HD tomorrow then likely Friday to get back on MWF schedule.  EDW 45.5 - last HD 11/5 post weight 43.8kg.  Poor appetite lately, will need EDW lowered.  K 2.6 - has been replaced, tends to run in mid to low 3s at HD.  Use 4K dialysate tomorrow.    3 Anemia: Hb 10.3.  Not due for ESA at this time.     4. BMM: continue outpatient calcitriol dose. Check phos here.   5. Nutrition  Albumin 3, weight loss lately.  Nephro BID  Justin Mend 03/31/2018, 2:45 PM

## 2018-04-01 DIAGNOSIS — J441 Chronic obstructive pulmonary disease with (acute) exacerbation: Principal | ICD-10-CM

## 2018-04-01 LAB — CBC
HCT: 29.5 % — ABNORMAL LOW (ref 36.0–46.0)
Hemoglobin: 9.1 g/dL — ABNORMAL LOW (ref 12.0–15.0)
MCH: 31.8 pg (ref 26.0–34.0)
MCHC: 30.8 g/dL (ref 30.0–36.0)
MCV: 103.1 fL — AB (ref 80.0–100.0)
NRBC: 0 % (ref 0.0–0.2)
Platelets: 117 10*3/uL — ABNORMAL LOW (ref 150–400)
RBC: 2.86 MIL/uL — AB (ref 3.87–5.11)
RDW: 14.7 % (ref 11.5–15.5)
WBC: 10.6 10*3/uL — ABNORMAL HIGH (ref 4.0–10.5)

## 2018-04-01 LAB — BASIC METABOLIC PANEL
Anion gap: 11 (ref 5–15)
BUN: 39 mg/dL — AB (ref 8–23)
CHLORIDE: 101 mmol/L (ref 98–111)
CO2: 23 mmol/L (ref 22–32)
CREATININE: 6.96 mg/dL — AB (ref 0.44–1.00)
Calcium: 9.3 mg/dL (ref 8.9–10.3)
GFR calc Af Amer: 6 mL/min — ABNORMAL LOW (ref >60)
GFR, EST NON AFRICAN AMERICAN: 5 mL/min — AB (ref >60)
Glucose, Bld: 129 mg/dL — ABNORMAL HIGH (ref 70–99)
Potassium: 3.6 mmol/L (ref 3.5–5.1)
Sodium: 135 mmol/L (ref 135–145)

## 2018-04-01 LAB — MAGNESIUM: Magnesium: 2.4 mg/dL (ref 1.7–2.4)

## 2018-04-01 LAB — PHOSPHORUS: Phosphorus: 5.4 mg/dL — ABNORMAL HIGH (ref 2.5–4.6)

## 2018-04-01 LAB — GLUCOSE, CAPILLARY: GLUCOSE-CAPILLARY: 97 mg/dL (ref 70–99)

## 2018-04-01 MED ORDER — SODIUM CHLORIDE 0.9 % IV SOLN: 100.0000 mL | INTRAVENOUS | Status: DC | PRN

## 2018-04-01 MED ORDER — HEPARIN SODIUM (PORCINE) 1000 UNIT/ML DIALYSIS: 4200.0000 [IU] | INTRAMUSCULAR | Status: DC | PRN

## 2018-04-01 MED ORDER — ALTEPLASE 2 MG IJ SOLR: 2.0000 mg | Freq: Once | INTRAMUSCULAR | Status: DC | PRN

## 2018-04-01 MED ORDER — LIDOCAINE-PRILOCAINE 2.5-2.5 % EX CREA: 1.0000 "application " | TOPICAL_CREAM | CUTANEOUS | Status: DC | PRN

## 2018-04-01 MED ORDER — LIDOCAINE HCL (PF) 1 % IJ SOLN: 5.0000 mL | INTRAMUSCULAR | Status: DC | PRN

## 2018-04-01 MED ORDER — PENTAFLUOROPROP-TETRAFLUOROETH EX AERO: 1.0000 "application " | INHALATION_SPRAY | CUTANEOUS | Status: DC | PRN

## 2018-04-01 MED ORDER — MIDODRINE HCL 5 MG PO TABS
5.0000 mg | ORAL_TABLET | Freq: Three times a day (TID) | ORAL | Status: AC
  Administered 2018-04-01: 5 mg via ORAL
  Filled 2018-04-01: qty 1

## 2018-04-01 MED ORDER — PREDNISONE 20 MG PO TABS
50.0000 mg | ORAL_TABLET | Freq: Every day | ORAL | Status: DC
  Administered 2018-04-02: 50 mg via ORAL
  Filled 2018-04-01 (×2): qty 2

## 2018-04-01 MED ORDER — CHLORHEXIDINE GLUCONATE CLOTH 2 % EX PADS
6.0000 | MEDICATED_PAD | Freq: Every day | CUTANEOUS | Status: DC
  Administered 2018-04-01: 6 via TOPICAL

## 2018-04-01 MED ORDER — HEPARIN SODIUM (PORCINE) 1000 UNIT/ML DIALYSIS: 1000.0000 [IU] | INTRAMUSCULAR | Status: DC | PRN

## 2018-04-01 NOTE — Progress Notes (Signed)
[facility] Progress Note   Subjective:   Feels a bit better this AM but still needs O2 [place].   Objective Vitals:   03/31/18 0817 03/31/18 1735 04/01/18 0104 04/01/18 0500  BP: (!) 100/57 (!) 103/56 (!) 96/56   Pulse: (!) 107 100 94   Resp: 14 14 20    Temp: 98.2 F (36.8 C) 98.6 F (37 C) 98 F (36.7 C)   TempSrc: Oral Oral Oral   SpO2: 100% 100% 100%   Weight:    44.3 kg  Height:       Physical Exam General: more comfortable Heart: RRR Lungs: prolong exp phase, no wheezing, able to speak in complete sentences today Abdomen: soft Extremities: no edema Dialysis Access:  LUE AVG +t/b  Additional Objective Labs: Basic Metabolic Panel: Recent Labs  Lab 03/29/18 1153 03/31/18 0209 04/01/18 0247  NA 140 140 135  K 2.7* 2.6* 3.6  CL 111 101 101  CO2 24 29 23   GLUCOSE 86 134* 129*  BUN 34* 15 39*  CREATININE 7.74* 4.50* 6.96*  CALCIUM 8.6* 8.5* 9.3   Liver Function Tests: Recent Labs  Lab 03/29/18 1153 03/31/18 0209  AST 13* 19  ALT 10 10  ALKPHOS 63 65  BILITOT 0.7 0.5  PROT 6.6 6.6  ALBUMIN 3.1* 3.0*   Recent Labs  Lab 03/31/18 0209  LIPASE 52*   CBC: Recent Labs  Lab 03/29/18 1153 03/31/18 0209 04/01/18 0647  WBC 8.3 6.3 10.6*  NEUTROABS 5.8 3.8  --   HGB 10.3* 10.3* 9.1*  HCT 35.2* 35.0* 29.5*  MCV 105.4* 105.1* 103.1*  PLT 159 122* 117*   Blood Culture    Component Value Date/Time   SDES URINE, CLEAN CATCH 03/19/2017 1934   SPECREQUEST NONE 03/19/2017 1934   CULT (A) 03/19/2017 1934    40,000 COLONIES/mL LACTOBACILLUS SPECIES Standardized susceptibility testing for this organism is not available.    REPTSTATUS 03/21/2017 FINAL 03/19/2017 1934    Cardiac Enzymes: No results for input(s): CKTOTAL, CKMB, CKMBINDEX, TROPONINI in the last 168 hours. CBG: Recent Labs  Lab 03/31/18 0817 04/01/18 0722  GLUCAP 162* 97   Iron Studies: No results for input(s): IRON, TIBC, TRANSFERRIN, FERRITIN in the last 72  hours. @lablastinr3 @ Studies/Results: Dg Chest Port 1 View  Result Date: 03/31/2018 CLINICAL DATA:  Shortness of breath tonight EXAM: PORTABLE CHEST 1 VIEW COMPARISON:  03/29/2018 FINDINGS: Emphysematous changes in the lungs. Central interstitial pattern consistent with chronic bronchitis. No airspace disease or consolidation. No blunting of costophrenic angles. No pneumothorax. Mediastinal contours appear intact. Normal heart size and pulmonary vascularity. Surgical clips in the base of the neck. No change since prior study. IMPRESSION: Emphysematous and chronic bronchitic changes in the lungs. No evidence of active pulmonary disease. Electronically Signed   By: Lucienne Capers M.D.   On: 03/31/2018 02:03   Medications: . sodium chloride    . sodium chloride    . sodium chloride    . potassium chloride     . amoxicillin-clavulanate  1 tablet Oral Q24H  . [START ON 04/02/2018] calcitRIOL  1.75 mcg Oral Q M,W,F-HD  . Chlorhexidine Gluconate Cloth  6 each Topical Q0600  . Chlorhexidine Gluconate Cloth  6 each Topical Q0600  . feeding supplement (NEPRO CARB STEADY)  237 mL Oral BID BM  . fluticasone furoate-vilanterol  1 puff Inhalation Daily   And  . umeclidinium bromide  1 puff Inhalation Daily  . heparin  5,000 Units Subcutaneous Q8H  . levothyroxine  125  mcg Oral QAC breakfast  . methylPREDNISolone (SOLU-MEDROL) injection  60 mg Intravenous Q12H  . midodrine  5 mg Oral Q M,W,F  . midodrine  5 mg Oral TID WC  . sodium bicarbonate  1,300 mg Oral Daily  . sodium chloride flush  3 mL Intravenous Q12H  . sodium chloride flush  3 mL Intravenous Q12H    Dialyzes at [facility] MWF 4hr 15 min EDW 45.5kg. HD Bath 4/2.25, Dialyzer 180, Heparin 4200 IV bolus. Access AVG. venofer 50 weekly mircera 75 qow last dose 10/30 Calcitriol 1.75 TIW  Assessment/Plan: 1 COPD exacerbation: care per primary.  Does not appear volume is a contributor to pulm compromise at this time.  Appears improved c/w  yesterday.  2 ESRD: HD MWF [place].  Off schedule = dialyzed Tues.  HD today and again tomorrow if remains admitted o/w can go to outpt unit tomorrow. EDW 45.5 - last HD 11/5 post weight 43.8kg.  Poor appetite lately, will need EDW lowered.  K 2.6 - has been replaced, tends to run in mid to low 3s at HD.  Use 4K dialysate today.    3 Anemia: Hb 10.3 > 9.1.  Not due for ESA at this time.     4. BMM: continue outpatient calcitriol dose. Check phos here.   5. Nutrition  Albumin 3, weight loss lately.  Nephro BID  Jannifer Hick MD 04/01/2018, 7:55 AM  [facility] Pager: [phone]

## 2018-04-01 NOTE — Progress Notes (Signed)
[facility] Progress Note  Patient: Natasha [name]:[id number]   PCP: [name], MD DOB: May 27, 1945   DOA: 03/31/2018   DOS: 04/01/2018   Date of Service: the patient was seen and examined on 04/01/2018  Brief hospital course: Pt. with PMH of ESRD on HD, COPD, chronic hypoxia; admitted on 03/31/2018, presented with complaint of shortness of breath, was found to have COPD exacerbation. Currently further plan is continue current treatment.  Subjective: Feeling better but still short of breath.  Unable to go to the bathroom without going out of breath.  No chest pain no abdominal pain.  Assessment and Plan: 1.  COPD exacerbation. Continue IV antibiotics.  Continue steroids.  Change to oral.  Follow-up on cultures.  2.  ESRD on HD. Continue HD.  Appreciate nephrology assistance.  3.  Hypokalemia. Corrected.  4.  Hypothyroidism. Continue Synthroid.  5.  Crohn's colitis. Currently on Cardizem. Humira outpatient. 6. Anemia of chronic kidney disease.  Monitor H&H.    Component Value Date/Time   HGB 9.1 (L) 04/01/2018 0647   HCT 29.5 (L) 04/01/2018 0647    7. Body mass index is 16.57 kg/m.  Underweight.  Monitor.   Pressure Injury 07/12/16 Deep Tissue Injury - Purple or maroon localized area of discolored intact skin or blood-filled blister due to damage of underlying soft tissue from pressure and/or shear. (Active)  07/12/16 0800   Location: Foot  Location Orientation: Right;Lateral  Staging: Deep Tissue Injury - Purple or maroon localized area of discolored intact skin or blood-filled blister due to damage of underlying soft tissue from pressure and/or shear.  Wound Description (Comments):   Present on Admission: Yes     Diet: renal diet DVT Prophylaxis: subcutaneous Heparin  Advance goals of care discussion: full code  Family Communication: family was present at bedside, at the time of interview. The pt provided permission to discuss medical plan  with the family. Opportunity was given to ask question and all questions were answered satisfactorily.   Disposition:  Discharge to no.  Consultants: Nephrology Procedures: no  Scheduled Meds: . amoxicillin-clavulanate  1 tablet Oral Q24H  . [START ON 04/02/2018] calcitRIOL  1.75 mcg Oral Q M,W,F-HD  . Chlorhexidine Gluconate Cloth  6 each Topical Q0600  . feeding supplement (NEPRO CARB STEADY)  237 mL Oral BID BM  . fluticasone furoate-vilanterol  1 puff Inhalation Daily   And  . umeclidinium bromide  1 puff Inhalation Daily  . heparin  5,000 Units Subcutaneous Q8H  . levothyroxine  125 mcg Oral QAC breakfast  . midodrine  5 mg Oral Q M,W,F  . [START ON 04/02/2018] predniSONE  50 mg Oral Q breakfast  . sodium bicarbonate  1,300 mg Oral Daily  . sodium chloride flush  3 mL Intravenous Q12H  . sodium chloride flush  3 mL Intravenous Q12H   Continuous Infusions: . sodium chloride     PRN Meds: sodium chloride, acetaminophen **OR** acetaminophen, albuterol, HYDROcodone-acetaminophen, ondansetron **OR** ondansetron (ZOFRAN) IV, sodium chloride flush Antibiotics: Anti-infectives (From admission, onward)   Start     Dose/Rate Route Frequency Ordered Stop   03/31/18 1000  amoxicillin-clavulanate (AUGMENTIN) 875-125 MG per tablet 1 tablet  Status:  Discontinued     1 tablet Oral 2 times daily 03/31/18 0512 03/31/18 0744   03/31/18 1000  amoxicillin-clavulanate (AUGMENTIN) 500-125 MG per tablet 500 mg     1 tablet Oral Every 24 hours 03/31/18 0744         Objective: Physical Exam: Vitals:  04/01/18 1245 04/01/18 1315 04/01/18 1652 04/01/18 1830  BP: 109/67 117/71 98/61 110/64  Pulse: 80 82 97 100  Resp:  17 15   Temp:  98.4 F (36.9 C) 98 F (36.7 C)   TempSrc:  Oral Oral   SpO2: 100% 100% 100% 100%  Weight:  43.8 kg    Height:        Intake/Output Summary (Last 24 hours) at 04/01/2018 1855 Last data filed at 04/01/2018 1315 Gross per 24 hour  Intake -  Output 500 ml    Net -500 ml   Filed Weights   04/01/18 0500 04/01/18 0850 04/01/18 1315  Weight: 44.3 kg 44.2 kg 43.8 kg   General: Alert, Awake and Oriented to Time, Place and Person. Appear in mild distress, affect appropriate Eyes: PERRL, Conjunctiva normal ENT: Oral Mucosa clear moist. Neck: no JVD, no Abnormal Mass Or lumps Cardiovascular: S1 and S2 Present, aortic systolic  Murmur, Peripheral Pulses Present Respiratory: normal respiratory effort, Bilateral Air entry equal and Decreased, no use of accessory muscle, Clear to Auscultation, no Crackles, no wheezes Abdomen: Bowel Sound present, Soft and no tenderness, no hernia Skin: no redness, no Rash, no induration Extremities: no Pedal edema, no calf tenderness Neurologic: Grossly no focal neuro deficit. Bilaterally Equal motor strength  Data Reviewed: CBC: Recent Labs  Lab 03/29/18 1153 03/31/18 0209 04/01/18 0647  WBC 8.3 6.3 10.6*  NEUTROABS 5.8 3.8  --   HGB 10.3* 10.3* 9.1*  HCT 35.2* 35.0* 29.5*  MCV 105.4* 105.1* 103.1*  PLT 159 122* 021*   Basic Metabolic Panel: Recent Labs  Lab 03/29/18 1153 03/31/18 0209 04/01/18 0247  NA 140 140 135  K 2.7* 2.6* 3.6  CL 111 101 101  CO2 24 29 23   GLUCOSE 86 134* 129*  BUN 34* 15 39*  CREATININE 7.74* 4.50* 6.96*  CALCIUM 8.6* 8.5* 9.3  MG  --  3.1* 2.4  PHOS  --   --  5.4*    Liver Function Tests: Recent Labs  Lab 03/29/18 1153 03/31/18 0209  AST 13* 19  ALT 10 10  ALKPHOS 63 65  BILITOT 0.7 0.5  PROT 6.6 6.6  ALBUMIN 3.1* 3.0*   Recent Labs  Lab 03/31/18 0209  LIPASE 52*   No results for input(s): AMMONIA in the last 168 hours. Coagulation Profile: No results for input(s): INR, PROTIME in the last 168 hours. Cardiac Enzymes: No results for input(s): CKTOTAL, CKMB, CKMBINDEX, TROPONINI in the last 168 hours. BNP (last 3 results) No results for input(s): PROBNP in the last 8760 hours. CBG: Recent Labs  Lab 03/31/18 0817 04/01/18 0722  GLUCAP 162* 97    Studies: No results found.   Time spent: 35 minutes  Author: Berle Mull, MD [facility] Pager: [phone] 04/01/2018 6:55 PM  Between 7PM-7AM, please contact night-coverage at [email or web address], password [facility]

## 2018-04-02 LAB — RENAL FUNCTION PANEL
Albumin: 2.7 g/dL — ABNORMAL LOW (ref 3.5–5.0)
Anion gap: 9 (ref 5–15)
BUN: 37 mg/dL — ABNORMAL HIGH (ref 8–23)
CALCIUM: 9 mg/dL (ref 8.9–10.3)
CO2: 27 mmol/L (ref 22–32)
Chloride: 99 mmol/L (ref 98–111)
Creatinine, Ser: 4.76 mg/dL — ABNORMAL HIGH (ref 0.44–1.00)
GFR calc Af Amer: 10 mL/min — ABNORMAL LOW (ref >60)
GFR calc non Af Amer: 8 mL/min — ABNORMAL LOW (ref >60)
Glucose, Bld: 122 mg/dL — ABNORMAL HIGH (ref 70–99)
Phosphorus: 3.8 mg/dL (ref 2.5–4.6)
Potassium: 4 mmol/L (ref 3.5–5.1)
SODIUM: 135 mmol/L (ref 135–145)

## 2018-04-02 LAB — GLUCOSE, CAPILLARY
GLUCOSE-CAPILLARY: 53 mg/dL — AB (ref 70–99)
Glucose-Capillary: 73 mg/dL (ref 70–99)

## 2018-04-02 LAB — CBC
HCT: 30.8 % — ABNORMAL LOW (ref 36.0–46.0)
Hemoglobin: 9 g/dL — ABNORMAL LOW (ref 12.0–15.0)
MCH: 30.7 pg (ref 26.0–34.0)
MCHC: 29.2 g/dL — ABNORMAL LOW (ref 30.0–36.0)
MCV: 105.1 fL — AB (ref 80.0–100.0)
Platelets: 135 10*3/uL — ABNORMAL LOW (ref 150–400)
RBC: 2.93 MIL/uL — ABNORMAL LOW (ref 3.87–5.11)
RDW: 14.7 % (ref 11.5–15.5)
WBC: 6.9 10*3/uL (ref 4.0–10.5)
nRBC: 0 % (ref 0.0–0.2)

## 2018-04-02 MED ORDER — CALCITRIOL 0.5 MCG PO CAPS
ORAL_CAPSULE | ORAL | Status: AC
  Filled 2018-04-02: qty 3

## 2018-04-02 MED ORDER — AMOXICILLIN-POT CLAVULANATE 500-125 MG PO TABS: 1.0000 | ORAL_TABLET | ORAL | 0 refills | Status: AC

## 2018-04-02 MED ORDER — DEXTROMETHORPHAN POLISTIREX ER 30 MG/5ML PO SUER
30.0000 mg | Freq: Two times a day (BID) | ORAL | Status: DC | PRN
  Administered 2018-04-02: 30 mg via ORAL
  Filled 2018-04-02 (×2): qty 5

## 2018-04-02 MED ORDER — LOPERAMIDE HCL 2 MG PO CAPS
2.0000 mg | ORAL_CAPSULE | Freq: Once | ORAL | Status: AC
  Administered 2018-04-02: 2 mg via ORAL
  Filled 2018-04-02: qty 1

## 2018-04-02 MED ORDER — PREDNISONE 10 MG PO TABS: ORAL_TABLET | ORAL | 0 refills | Status: DC

## 2018-04-02 MED ORDER — GUAIFENESIN ER 600 MG PO TB12
600.0000 mg | ORAL_TABLET | Freq: Two times a day (BID) | ORAL | Status: DC
  Administered 2018-04-02: 600 mg via ORAL
  Filled 2018-04-02: qty 1

## 2018-04-02 MED ORDER — CALCITRIOL 0.25 MCG PO CAPS
ORAL_CAPSULE | ORAL | Status: AC
  Filled 2018-04-02: qty 1

## 2018-04-02 MED ORDER — CHLORHEXIDINE GLUCONATE CLOTH 2 % EX PADS
6.0000 | MEDICATED_PAD | Freq: Every day | CUTANEOUS | Status: DC
  Administered 2018-04-02: 6 via TOPICAL

## 2018-04-02 MED ORDER — DEXTROMETHORPHAN POLISTIREX ER 30 MG/5ML PO SUER: 30.0000 mg | Freq: Two times a day (BID) | ORAL | 0 refills | Status: DC | PRN

## 2018-04-02 MED ORDER — GUAIFENESIN ER 600 MG PO TB12: 600.0000 mg | ORAL_TABLET | Freq: Two times a day (BID) | ORAL | 0 refills | Status: DC

## 2018-04-02 MED FILL — AMOX-CLAV 500-125 MG TABLET: 500-125 | 2 days supply | Qty: 2 | Fill #0

## 2018-04-02 MED FILL — predniSONE 10 MG TABS: 10 | 5 days supply | Qty: 10 | Fill #0

## 2018-04-02 NOTE — Care Management Note (Signed)
Case Management Note  Patient Details  Name: Natasha [name] MRN: [id number] Date of Birth: Jun 21, 1945  Subjective/Objective:  From home, copd ex, ESRD MWF, NCM spoke with patient offered choice, she chose [facility] for DME oxygen , referral made to [facility] with [facility] for home oxgen.                   Action/Plan: DC home when ready.  Expected Discharge Date:                  Expected Discharge Plan:     In-House Referral:     Discharge planning Services  CM Consult  Post Acute Care Choice:  Durable Medical Equipment Choice offered to:  Patient  DME Arranged:  Oxygen DME Agency:  [place]:    [place] Agency:     Status of Service:  Completed, signed off  If discussed at [place] of Stay Meetings, dates discussed:    Additional Comments:  Zenon Mayo, RN 04/02/2018, 12:28 PM

## 2018-04-02 NOTE — Progress Notes (Signed)
[facility] Progress Note   Subjective:   Feels a bit better this AM but still needs O2 [place].  Says she had an episode last night of resp distress while using bathroom without O2.  Required neb and assistance back to bed.  TOl HD well yesterday UF 0.5L  Objective Vitals:   04/02/18 0036 04/02/18 0111 04/02/18 0732 04/02/18 0840  BP: 105/64  113/63   Pulse: 86  82   Resp: 10  12   Temp: 98.7 F (37.1 C)  (!) 97.5 F (36.4 C)   TempSrc: Oral  Oral   SpO2: 100% 100% 95% 96%  Weight:      Height:       Physical Exam General: more comfortable Heart: RRR Lungs: prolong exp phase, slight wheezing on R, able to speak in complete sentences today, no rales Abdomen: soft Extremities: no edema Dialysis Access:  LUE AVG +t/b  Additional Objective Labs: Basic Metabolic Panel: Recent Labs  Lab 03/29/18 1153 03/31/18 0209 04/01/18 0247  NA 140 140 135  K 2.7* 2.6* 3.6  CL 111 101 101  CO2 24 29 23   GLUCOSE 86 134* 129*  BUN 34* 15 39*  CREATININE 7.74* 4.50* 6.96*  CALCIUM 8.6* 8.5* 9.3  PHOS  --   --  5.4*   Liver Function Tests: Recent Labs  Lab 03/29/18 1153 03/31/18 0209  AST 13* 19  ALT 10 10  ALKPHOS 63 65  BILITOT 0.7 0.5  PROT 6.6 6.6  ALBUMIN 3.1* 3.0*   Recent Labs  Lab 03/31/18 0209  LIPASE 52*   CBC: Recent Labs  Lab 03/29/18 1153 03/31/18 0209 04/01/18 0647  WBC 8.3 6.3 10.6*  NEUTROABS 5.8 3.8  --   HGB 10.3* 10.3* 9.1*  HCT 35.2* 35.0* 29.5*  MCV 105.4* 105.1* 103.1*  PLT 159 122* 117*   Blood Culture    Component Value Date/Time   SDES URINE, CLEAN CATCH 03/19/2017 1934   SPECREQUEST NONE 03/19/2017 1934   CULT (A) 03/19/2017 1934    40,000 COLONIES/mL LACTOBACILLUS SPECIES Standardized susceptibility testing for this organism is not available.    REPTSTATUS 03/21/2017 FINAL 03/19/2017 1934    Cardiac Enzymes: No results for input(s): CKTOTAL, CKMB, CKMBINDEX, TROPONINI in the last 168 hours. CBG: Recent Labs   Lab 03/31/18 0817 04/01/18 0722 04/02/18 0653 04/02/18 0727  GLUCAP 162* 97 53* 73   Iron Studies: No results for input(s): IRON, TIBC, TRANSFERRIN, FERRITIN in the last 72 hours. @lablastinr3 @ Studies/Results: No results found. Medications: . sodium chloride     . amoxicillin-clavulanate  1 tablet Oral Q24H  . calcitRIOL  1.75 mcg Oral Q M,W,F-HD  . Chlorhexidine Gluconate Cloth  6 each Topical Q0600  . feeding supplement (NEPRO CARB STEADY)  237 mL Oral BID BM  . fluticasone furoate-vilanterol  1 puff Inhalation Daily   And  . umeclidinium bromide  1 puff Inhalation Daily  . guaiFENesin  600 mg Oral BID  . heparin  5,000 Units Subcutaneous Q8H  . levothyroxine  125 mcg Oral QAC breakfast  . midodrine  5 mg Oral Q M,W,F  . predniSONE  50 mg Oral Q breakfast  . sodium bicarbonate  1,300 mg Oral Daily  . sodium chloride flush  3 mL Intravenous Q12H  . sodium chloride flush  3 mL Intravenous Q12H    Dialyzes at [facility] MWF 4hr 15 min EDW 45.5kg. HD Bath 4/2.25, Dialyzer 180, Heparin 4200 IV bolus. Access AVG. venofer 50 weekly mircera 75 qow last  dose 10/30 Calcitriol 1.75 TIW  Assessment/Plan: 1 COPD exacerbation: care per primary.  Does not appear volume is a contributor to pulm compromise at this time.  Appears improved c/w yesterday. On po prednisone now.   2 ESRD: HD MWF [place].  Off schedule = dialyzed Tues, Thurs.  If not discharging today will do HD tomorrow then resume MWF schedule Monday.  Poor appetite lately, will need EDW lowered (45.5 > post wt yesterday 43.8).    3 Anemia: Hb 10.3 > 9.1.  Not due for ESA at this time.     4. BMM: continue outpatient calcitriol dose.  Phos here 5.4.   5. Nutrition  Albumin 3, weight loss lately.  Nephro BID  Jannifer Hick MD 04/02/2018, 10:16 AM  [facility] Pager: [phone]

## 2018-04-02 NOTE — Progress Notes (Signed)
SATURATION QUALIFICATIONS: (This note is used to comply with regulatory documentation for home oxygen)  Patient Saturations on Room Air at Rest = 85%  Patient Saturations on Room Air while Ambulating = 80%  Patient Saturations on 2 Liters of oxygen while Ambulating = 95%  Please briefly explain why patient needs home oxygen: Acute on Chronic Respiratory Failure related to fluid overload, pneumonia, and COPD Exacerbation.

## 2018-04-02 NOTE — Significant Event (Signed)
Hypoglycemic Event  CBG: 53  Treatment: 15 GM carbohydrate snack  Symptoms: None  Follow-up CBG: Time:0727 CBG Result:73  Possible Reasons for Event: Unknown  Comments/MD notified:    Georgiann Cocker

## 2018-04-02 NOTE — Care Management Important Message (Signed)
Important Message  Patient Details  Name: Natasha [name] MRN: [id number] Date of Birth: 1945/06/13   Medicare Important Message Given:  Yes    Orbie Pyo 04/02/2018, 3:31 PM

## 2018-04-02 NOTE — Progress Notes (Signed)
Pt refused transport to dialysis this morning stating "she did not need to go and would talk to her doctor today".  Dialysis RN was notified by phone.

## 2018-04-05 NOTE — Discharge Summary (Signed)
[facility] Discharge Summary   Patient: Natasha [name] LOV:[id number]   PCP: [name], MD DOB: 1946-01-25   Date of admission: 03/31/2018   Date of discharge: 04/02/2018    Discharge Diagnoses:  Principal diagnosis COPD with acute exacerbation secondary to acute bronchitis Principal Problem:   COPD with acute exacerbation ([place]) Active Problems:   Hypokalemia   Anemia of chronic disease   ESRD on dialysis ([place])   Hypothyroidism   Crohn's disease of colon with complication ([place])   Acute sinusitis   Admitted From: Home Disposition: Home  Recommendations for Outpatient Follow-up:  1. Please follow-up with PCP in 1 week  [place] Follow up.   Why:  home oxygen Contact information: [place] [address] [phone]        [name], MD. Schedule an appointment as soon as possible for a visit in 1 week(s).   Specialty:  Internal Medicine Contact information: [address] [place] [address] [phone]          Diet recommendation: Renal diet  Activity: The patient is advised to gradually reintroduce usual activities.  Discharge Condition: good  Code Status: Full code  History of present illness: As per the H and P dictated on admission, "Natasha [name] is a 72 y.o. female with medical history significant for Crohn's disease, ESRD on HD, hypothyroidism, COPD, and started on antibiotics 2 days ago for acute sinusitis, now presenting to the emergency department for evaluation of progressive shortness of breath and cough.  Patient reports that she developed these symptoms about a month ago, was treated with a short course of antibiotics, had some improvement, but then began to worsen again a week ago with subjective fevers, rhinorrhea, sinus and chest congestion, cough, and shortness of breath.  She was seen in the emergency department 2 days ago for  this, found to be tender over the left maxillary sinus, and was started on Augmentin for acute sinusitis.  Facial pain and sinus congestion has improved some, but her shortness of breath, cough, and wheezing has continued to worsen, becoming severe yesterday evening.  She denies any chest pain, leg swelling, or leg tenderness associated with this.  She has not been having abdominal pain, but reports chronic diarrhea and recent nausea with couple episodes of nonbloody vomiting that has since resolved.  She called EMS overnight due to her worsening cough and dyspnea, was treated with 125 mg of Solu-Medrol, 2 g IV magnesium, and DuoNeb x2 prior to arrival in the ED."  Hospital Course:  Summary of her active problems in the hospital is as following. 1.  COPD exacerbation. Treated with IV antibiotics.  Continue steroids.  Change to oral.  Cultures so far negative.  2.  ESRD on HD. Continue HD.  Appreciate nephrology assistance.  3.  Hypokalemia. Corrected.  4.  Hypothyroidism. Continue Synthroid.  5.  Crohn's colitis. Currently on Cardizem. Humira outpatient. 6. Anemia of chronic kidney disease.   7. Body mass index is 16.57 kg/m.  Underweight.  Monitor.  Pressure Injury 07/12/16 Deep Tissue Injury - Purple or maroon localized area of discolored intact skin or blood-filled blister due to damage of underlying soft tissue from pressure and/or shear. (Active)  07/12/16 0800   Location: Foot  Location Orientation: Right;Lateral  Staging: Deep Tissue Injury - Purple or maroon localized area of discolored intact skin or blood-filled blister due to damage of underlying soft tissue  from pressure and/or shear.  Wound Description (Comments):   Present on Admission: Yes     All other chronic medical condition were stable during the hospitalization.  Patient was ambulatory without any assistance. On the day of the discharge the patient's vital signs stable, and no other acute medical  condition were reported by patient. the patient was felt safe to be discharge at home with family.  Consultants: Nephrologist Procedures: Hemodialysis  DISCHARGE MEDICATION: Allergies as of 04/02/2018      Reactions   Mercaptopurine Other (See Comments)   Caused pancreatitis   Remicade [infliximab] Other (See Comments)   CAUSED JOINT PAIN      Medication List    STOP taking these medications   amoxicillin-clavulanate 875-125 MG tablet Commonly known as:  AUGMENTIN Replaced by:  amoxicillin-clavulanate 500-125 MG tablet     TAKE these medications   acetaminophen 500 MG tablet Commonly known as:  TYLENOL Take 500 mg by mouth every 6 (six) hours as needed for mild pain.   albuterol 108 (90 Base) MCG/ACT inhaler Commonly known as:  PROVENTIL HFA;VENTOLIN HFA Inhale 2 puffs into the lungs every 6 (six) hours as needed for wheezing or shortness of breath.   alendronate 70 MG tablet Commonly known as:  FOSAMAX Take 70 mg by mouth every Monday.   amoxicillin-clavulanate 500-125 MG tablet Commonly known as:  AUGMENTIN Take 1 tablet (500 mg total) by mouth daily for 2 days. Replaces:  amoxicillin-clavulanate 875-125 MG tablet   calcitRIOL 0.25 MCG capsule Commonly known as:  ROCALTROL Take 0.25 mcg by mouth every Monday, Wednesday, and Friday with hemodialysis.   cyanocobalamin 1000 MCG/ML injection Commonly known as:  (VITAMIN B-12) Inject 1,000 mcg into the muscle every 30 (thirty) days.   dextromethorphan 30 MG/5ML liquid Commonly known as:  DELSYM Take 5 mLs (30 mg total) by mouth 2 (two) times daily as needed for cough.   Fluticasone-Umeclidin-Vilant 100-62.5-25 MCG/INH Aepb Inhale 1 puff into the lungs daily.   guaiFENesin 600 MG 12 hr tablet Commonly known as:  MUCINEX Take 1 tablet (600 mg total) by mouth 2 (two) times daily.   HUMIRA 40 MG/0.8ML Pskt Generic drug:  Adalimumab Inject 40 mg into the skin every 14 (fourteen) days.   HYDROcodone-acetaminophen  5-325 MG tablet Commonly known as:  NORCO/VICODIN Take 1 tablet by mouth every 6 (six) hours as needed for moderate pain.   levothyroxine 125 MCG tablet Commonly known as:  SYNTHROID, LEVOTHROID Take 125 mcg by mouth daily before breakfast.   loperamide 2 MG capsule Commonly known as:  IMODIUM Take 2 capsules (4 mg total) by mouth as needed for diarrhea or loose stools.   midodrine 5 MG tablet Commonly known as:  PROAMATINE Take 5 mg by mouth See admin instructions. 5 mg PRIOR TO dialysis on Mon/Wed/Fri   nitroGLYCERIN 0.4 MG SL tablet Commonly known as:  NITROSTAT Place 1 tablet (0.4 mg total) under the tongue every 5 (five) minutes as needed for chest pain.   ondansetron 4 MG disintegrating tablet Commonly known as:  ZOFRAN-ODT Take 1 tablet (4 mg total) by mouth every 8 (eight) hours as needed for nausea or vomiting.   predniSONE 10 MG tablet Commonly known as:  DELTASONE Take 43m daily for 1days,Take 328mdaily for 1days,Take 2084maily for 1days,Take 78m71mily for 1days, then start 5 mg daily What changed:    medication strength  how much to take  how to take this  when to take this  additional instructions  sodium bicarbonate 650 MG tablet Take 2 tablets (1,300 mg total) by mouth 2 (two) times daily. What changed:  when to take this      Allergies  Allergen Reactions  . Mercaptopurine Other (See Comments)    Caused pancreatitis  . Remicade [Infliximab] Other (See Comments)    CAUSED JOINT PAIN   Discharge Instructions    Diet - low sodium heart healthy   Complete by:  As directed    Increase activity slowly   Complete by:  As directed      Discharge Exam: Filed Weights   04/01/18 1315 04/02/18 1414 04/02/18 1732  Weight: 43.8 kg 44.7 kg 43.7 kg   Vitals:   04/02/18 1730 04/02/18 1732  BP: 138/73 134/70  Pulse: 84 82  Resp:  16  Temp:  (!) 97 F (36.1 C)  SpO2:  98%   General: Appear in no distress, no Rash; Oral Mucosa  moist. Cardiovascular: S1 and S2 Present, no Murmur, no JVD Respiratory: Bilateral Air entry present and Clear to Auscultation, no Crackles, no wheezes Abdomen: Bowel Sound present, Soft and no tenderness Extremities: no Pedal edema, no calf tenderness Neurology: Grossly no focal neuro deficit.  The results of significant diagnostics from this hospitalization (including imaging, microbiology, ancillary and laboratory) are listed below for reference.    Significant Diagnostic Studies: Dg Chest 2 View  Result Date: 03/29/2018 CLINICAL DATA:  Cough EXAM: CHEST - 2 VIEW COMPARISON:  05/04/2017 FINDINGS: Interstitial coarsening and hyperinflation. There is no edema, consolidation, effusion, or pneumothorax. Normal heart size and mediastinal contours. Venous stent at the left axilla. Changes of probable parathyroidectomy sub. IMPRESSION: COPD without acute superimposed finding. Electronically Signed   By: Monte Fantasia M.D.   On: 03/29/2018 12:01   Dg Chest Port 1 View  Result Date: 03/31/2018 CLINICAL DATA:  Shortness of breath tonight EXAM: PORTABLE CHEST 1 VIEW COMPARISON:  03/29/2018 FINDINGS: Emphysematous changes in the lungs. Central interstitial pattern consistent with chronic bronchitis. No airspace disease or consolidation. No blunting of costophrenic angles. No pneumothorax. Mediastinal contours appear intact. Normal heart size and pulmonary vascularity. Surgical clips in the base of the neck. No change since prior study. IMPRESSION: Emphysematous and chronic bronchitic changes in the lungs. No evidence of active pulmonary disease. Electronically Signed   By: Lucienne Capers M.D.   On: 03/31/2018 02:03    Microbiology: Recent Results (from the past 240 hour(s))  MRSA PCR Screening     Status: None   Collection Time: 03/31/18  6:22 PM  Result Value Ref Range Status   MRSA by PCR NEGATIVE NEGATIVE Final    Comment:        The GeneXpert MRSA Assay (FDA approved for NASAL  specimens only), is one component of a comprehensive MRSA colonization surveillance program. It is not intended to diagnose MRSA infection nor to guide or monitor treatment for MRSA infections. Performed at [facility], [place] [address] [place], [place] [address]      Labs: CBC: Recent Labs  Lab 03/29/18 1153 03/31/18 0209 04/01/18 0647 04/02/18 1442  WBC 8.3 6.3 10.6* 6.9  NEUTROABS 5.8 3.8  --   --   HGB 10.3* 10.3* 9.1* 9.0*  HCT 35.2* 35.0* 29.5* 30.8*  MCV 105.4* 105.1* 103.1* 105.1*  PLT 159 122* 117* 195*   Basic Metabolic Panel: Recent Labs  Lab 03/29/18 1153 03/31/18 0209 04/01/18 0247 04/02/18 1442  NA 140 140 135 135  K 2.7* 2.6* 3.6 4.0  CL 111 101  101 99  CO2 24 29 23 27   GLUCOSE 86 134* 129* 122*  BUN 34* 15 39* 37*  CREATININE 7.74* 4.50* 6.96* 4.76*  CALCIUM 8.6* 8.5* 9.3 9.0  MG  --  3.1* 2.4  --   PHOS  --   --  5.4* 3.8   Liver Function Tests: Recent Labs  Lab 03/29/18 1153 03/31/18 0209 04/02/18 1442  AST 13* 19  --   ALT 10 10  --   ALKPHOS 63 65  --   BILITOT 0.7 0.5  --   PROT 6.6 6.6  --   ALBUMIN 3.1* 3.0* 2.7*   Recent Labs  Lab 03/31/18 0209  LIPASE 52*   No results for input(s): AMMONIA in the last 168 hours. Cardiac Enzymes: No results for input(s): CKTOTAL, CKMB, CKMBINDEX, TROPONINI in the last 168 hours. BNP (last 3 results) No results for input(s): BNP in the last 8760 hours. CBG: Recent Labs  Lab 03/31/18 0817 04/01/18 0722 04/02/18 0653 04/02/18 0727  GLUCAP 162* 97 53* 73   Time spent: 35 minutes  Signed:  Berle Mull  [facility] 04/02/2018 , 8:33 AM

## 2018-04-17 ENCOUNTER — Other Ambulatory Visit: Payer: Self-pay | Admitting: Pulmonary Disease

## 2018-04-27 ENCOUNTER — Encounter: Payer: Self-pay | Admitting: Pulmonary Disease

## 2018-04-27 ENCOUNTER — Ambulatory Visit: Payer: Medicare Other | Admitting: Pulmonary Disease

## 2018-04-27 VITALS — BP 98/58 | HR 92 | Ht 64.0 in | Wt 104.0 lb

## 2018-04-27 DIAGNOSIS — J432 Centrilobular emphysema: Secondary | ICD-10-CM | POA: Diagnosis not present

## 2018-04-27 NOTE — Patient Instructions (Signed)
Will arrange for overnight oxygen test  Follow up in 3 months

## 2018-04-27 NOTE — Progress Notes (Signed)
[facility]  Chief Complaint  Patient presents with  . Follow-up    Pt. states that she has had recen hosp admission due to breathing issues.    Constitutional:  BP (!) 98/58 (BP Location: Right Arm, Cuff Size: Normal)   Pulse 92   Ht 5' 4"  (1.626 m)   Wt 104 lb (47.2 kg)   SpO2 99%   BMI 17.85 kg/m   Past Medical History:  Anemia, ESRD, Back pain, Crohn's colitis, GERD, Glaucoma, Nephrolithiasis, Pancreatitis, Hypothyroidism, Osteoporosis, Seizure 2018, PAD  Brief Summary:  Natasha [name] is a 72 y.o. female  former smoker with COPD/Emphysema.  She was in hospital in November for COPD exacerbation.  She went to a college football game and was tail gaiting.  She was exposed to a lot of smoke.  This is what triggered her breathing problem.  She was sent home with oxygen.  She uses with exertion and at night.  She has occasional cough.  Not having wheeze, chest pain, sputum, or hemoptysis.  She gets winded with walking, laying flat, or bending over.  She continues to use trelegy and this helps.  Ambulatory oximetry on room air today >> SpO2 80% after 1 lap.  CXR 03/31/18 (reviewed by me) >> changes of emphysema, no acute infiltrate.  Physical Exam:   Appearance - thin  ENMT - clear nasal mucosa, midline nasal  septum, no oral exudates, no LAN, trachea midline  Respiratory - normal chest wall, normal respiratory effort, no accessory muscle use, no wheeze/rales  CV - s1s2 regular rate and rhythm, no murmurs, no peripheral edema, radial pulses symmetric  GI - soft, non tender, no masses  Lymph - no adenopathy noted in neck and axillary areas  MSK - normal gait  Ext - no cyanosis, clubbing, or joint inflammation noted  Skin - no rashes, lesions, or ulcers  Neuro - normal strength, oriented x 3  Psych - normal mood and affect   BMP Latest Ref Rng & Units 04/02/2018 04/01/2018 03/31/2018  Glucose 70 - 99 mg/dL 122(H) 129(H)  134(H)  BUN 8 - 23 mg/dL 37(H) 39(H) 15  Creatinine 0.44 - 1.00 mg/dL 4.76(H) 6.96(H) 4.50(H)  BUN/Creat Ratio 12 - 28 - - -  Sodium 135 - 145 mmol/L 135 135 140  Potassium 3.5 - 5.1 mmol/L 4.0 3.6 2.6(LL)  Chloride 98 - 111 mmol/L 99 101 101  CO2 22 - 32 mmol/L 27 23 29   Calcium 8.9 - 10.3 mg/dL 9.0 9.3 8.5(L)   CBC Latest Ref Rng & Units 04/02/2018 04/01/2018 03/31/2018  WBC 4.0 - 10.5 K/uL 6.9 10.6(H) 6.3  Hemoglobin 12.0 - 15.0 g/dL 9.0(L) 9.1(L) 10.3(L)  Hematocrit 36.0 - 46.0 % 30.8(L) 29.5(L) 35.0(L)  Platelets 150 - 400 K/uL 135(L) 117(L) 122(L)    Assessment/Plan:   Severe COPD with emphysema. - continue trelegy and prn albuterol  Chronic respiratory failure with hypoxia. - continue 2 liters oxygen with exertion and sleep for now - will arrange for ONO with RA  Crohn's colitis. - on chronic prednisone and humira - followed at [facility]  Stage IV CKD. - followed by [facility]  Patient Instructions  Will arrange for overnight oxygen test  Follow up in 3 months  Time spent 26 minutes  Chesley Mires, MD [facility] Pager: [phone] 04/27/2018, 12:04 PM  Flow Sheet     Pulmonary tests:  PFT 08/08/13 >> FEV1 1.00 (52%), FEV1% 50, TLC 3.97 (78%), DLCO 20% CT  chest 04/17/16 >> severe centrilobular emphysema Ambulatory oximetry with RA 04/27/18 >> SpO2 80%  Sleep tests:  HST 06/26/17 >> AHI 0.5, SaO2 low 86%.  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 Always use your most  recent med list.          acetaminophen 500 MG tablet Commonly known as:  TYLENOL Take 500 mg by mouth every 6 (six) hours as needed for mild pain.   albuterol 108 (90 Base) MCG/ACT inhaler Commonly known as:  PROVENTIL HFA;VENTOLIN HFA Inhale 2 puffs into the lungs every 6 (six) hours as needed for wheezing or shortness of breath.   alendronate 70 MG tablet Commonly known as:  FOSAMAX Take 70 mg by mouth every Monday.   calcitRIOL 0.25 MCG capsule Commonly known as:  ROCALTROL Take 0.25 mcg by mouth every Monday, Wednesday, and Friday with hemodialysis.   cyanocobalamin 1000 MCG/ML injection Commonly known as:  (VITAMIN B-12) Inject 1,000 mcg into the muscle every 30 (thirty) days.   dextromethorphan 30 MG/5ML liquid Commonly known as:  DELSYM Take 5 mLs (30 mg total) by mouth 2 (two) times daily as needed for cough.   guaiFENesin 600 MG 12 hr tablet Commonly known as:  MUCINEX Take 1 tablet (600 mg total) by mouth 2 (two) times daily.   HUMIRA 40 MG/0.8ML Pskt Generic drug:  Adalimumab Inject 40 mg into the skin every 14 (fourteen) days.   HYDROcodone-acetaminophen 5-325 MG tablet Commonly known as:  NORCO/VICODIN Take 1 tablet by mouth every 6 (six) hours as needed for moderate pain.   levothyroxine 125 MCG tablet Commonly known as:  SYNTHROID, LEVOTHROID Take 125 mcg by mouth daily before breakfast.   loperamide 2 MG capsule Commonly known as:  IMODIUM Take 2 capsules (4 mg total) by mouth as needed for diarrhea or loose stools.   midodrine 5 MG tablet Commonly known as:  PROAMATINE Take 5 mg by mouth See admin instructions. 5 mg PRIOR TO dialysis on Mon/Wed/Fri   nitroGLYCERIN 0.4 MG SL tablet Commonly known as:  NITROSTAT Place 1 tablet (0.4 mg total) under the tongue every 5 (five) minutes as needed for chest pain.   ondansetron 4 MG disintegrating tablet Commonly known as:  ZOFRAN-ODT Take 1 tablet (4 mg total) by mouth every 8 (eight) hours as  needed for nausea or vomiting.   predniSONE 10 MG tablet Commonly known as:  DELTASONE Take 58m daily for 1days,Take 357mdaily for 1days,Take 2034maily for 1days,Take 6m98mily for 1days, then start 5 mg daily   sodium bicarbonate 650 MG tablet Take 2 tablets (1,300 mg total) by mouth 2 (two) times daily.   TRELEGY ELLIPTA 100-62.5-25 MCG/INH Aepb Generic drug:  Fluticasone-Umeclidin-Vilant INHALE 1 PUFF BY MOUTH ONCE DAILY       Past Surgical History:  She  has a past surgical history that includes Colon resection (x3 --  1978,  1987, 1989); Esophagogastroduodenoscopy (03/04/2012); Flexible sigmoidoscopy (03/04/2012); Examination under  anesthesia (N/A, 09/12/2014); Incision and drainage perirectal abscess (N/A, 09/12/2014); Colonoscopy with propofol (N/A, 09/25/2014); Total thyroidectomy (10-13-2003); transthoracic echocardiogram (07-11-2013); Glaucoma surgery (Bilateral); NEGATIVE SLEEP STUDY (2008); Abdominal hysterectomy (1990); Rectal exam under anesthesia (N/A, 12/01/2014); Placement of seton (N/A, 12/01/2014); Cystoscopy w/ ureteral stent placement (Right, 11/19/2016); Holmium laser application (Right, 2/64/1583); Bascilic vein transposition (Left, 01/09/2017); IR Fluoro Guide CV Line Right (03/20/2017); IR US Guide Vasc Access Right (03/20/2017); Bascilic vein transposition (Left, 06/16/2017); Cholecystectomy; Eye surgery; Tubal ligation; AV fistula placement (Left, 12/08/2017); Colonoscopy with propofol (N/A, 03/09/2018); biopsy (03/09/2018); and polypectomy (03/09/2018).  Family History:  Her family history includes COPD in her mother; Colon cancer in her brother; Diabetes in her brother; Diverticulitis in her mother; Heart attack in her brother, father, and mother; Heart disease in her father and mother; Hypertension in her father and mother; Thyroid disease in her brother.  Social History:  She  reports that she quit smoking about 16 years ago. Her smoking use included cigarettes. She has  a 3.75 pack-year smoking history. She has never used smokeless tobacco. She reports that she does not drink alcohol or use drugs.

## 2018-05-28 ENCOUNTER — Emergency Department (HOSPITAL_COMMUNITY): Payer: Medicare Other

## 2018-05-28 ENCOUNTER — Encounter (HOSPITAL_COMMUNITY): Payer: Self-pay

## 2018-05-28 ENCOUNTER — Other Ambulatory Visit: Payer: Self-pay

## 2018-05-28 ENCOUNTER — Inpatient Hospital Stay (HOSPITAL_COMMUNITY)
Admission: EM | Admit: 2018-05-28 | Discharge: 2018-06-06 | DRG: 175 | Disposition: A | Discharge status: Home / Self Care | Payer: Medicare Other | Attending: Internal Medicine | Admitting: Internal Medicine

## 2018-05-28 DIAGNOSIS — J441 Chronic obstructive pulmonary disease with (acute) exacerbation: Secondary | ICD-10-CM

## 2018-05-28 DIAGNOSIS — Z9981 Dependence on supplemental oxygen: Secondary | ICD-10-CM

## 2018-05-28 DIAGNOSIS — I739 Peripheral vascular disease, unspecified: Secondary | ICD-10-CM | POA: Diagnosis present

## 2018-05-28 DIAGNOSIS — M810 Age-related osteoporosis without current pathological fracture: Secondary | ICD-10-CM | POA: Diagnosis present

## 2018-05-28 DIAGNOSIS — J9621 Acute and chronic respiratory failure with hypoxia: Secondary | ICD-10-CM | POA: Diagnosis present

## 2018-05-28 DIAGNOSIS — E8770 Fluid overload, unspecified: Secondary | ICD-10-CM | POA: Diagnosis present

## 2018-05-28 DIAGNOSIS — Z7951 Long term (current) use of inhaled steroids: Secondary | ICD-10-CM

## 2018-05-28 DIAGNOSIS — E039 Hypothyroidism, unspecified: Secondary | ICD-10-CM | POA: Diagnosis not present

## 2018-05-28 DIAGNOSIS — Z87891 Personal history of nicotine dependence: Secondary | ICD-10-CM

## 2018-05-28 DIAGNOSIS — M1990 Unspecified osteoarthritis, unspecified site: Secondary | ICD-10-CM | POA: Diagnosis present

## 2018-05-28 DIAGNOSIS — K219 Gastro-esophageal reflux disease without esophagitis: Secondary | ICD-10-CM | POA: Diagnosis present

## 2018-05-28 DIAGNOSIS — J449 Chronic obstructive pulmonary disease, unspecified: Secondary | ICD-10-CM | POA: Diagnosis present

## 2018-05-28 DIAGNOSIS — I959 Hypotension, unspecified: Secondary | ICD-10-CM | POA: Diagnosis present

## 2018-05-28 DIAGNOSIS — Z888 Allergy status to other drugs, medicaments and biological substances status: Secondary | ICD-10-CM

## 2018-05-28 DIAGNOSIS — Z87442 Personal history of urinary calculi: Secondary | ICD-10-CM

## 2018-05-28 DIAGNOSIS — Z8349 Family history of other endocrine, nutritional and metabolic diseases: Secondary | ICD-10-CM

## 2018-05-28 DIAGNOSIS — D631 Anemia in chronic kidney disease: Secondary | ICD-10-CM | POA: Diagnosis present

## 2018-05-28 DIAGNOSIS — Z79899 Other long term (current) drug therapy: Secondary | ICD-10-CM

## 2018-05-28 DIAGNOSIS — Z825 Family history of asthma and other chronic lower respiratory diseases: Secondary | ICD-10-CM

## 2018-05-28 DIAGNOSIS — E8889 Other specified metabolic disorders: Secondary | ICD-10-CM | POA: Diagnosis present

## 2018-05-28 DIAGNOSIS — N2581 Secondary hyperparathyroidism of renal origin: Secondary | ICD-10-CM | POA: Diagnosis present

## 2018-05-28 DIAGNOSIS — E876 Hypokalemia: Secondary | ICD-10-CM | POA: Diagnosis present

## 2018-05-28 DIAGNOSIS — K5080 Crohn's disease of both small and large intestine without complications: Secondary | ICD-10-CM | POA: Diagnosis present

## 2018-05-28 DIAGNOSIS — Z681 Body mass index (BMI) 19 or less, adult: Secondary | ICD-10-CM | POA: Diagnosis not present

## 2018-05-28 DIAGNOSIS — N186 End stage renal disease: Secondary | ICD-10-CM | POA: Diagnosis present

## 2018-05-28 DIAGNOSIS — Z7952 Long term (current) use of systemic steroids: Secondary | ICD-10-CM

## 2018-05-28 DIAGNOSIS — Z79890 Hormone replacement therapy (postmenopausal): Secondary | ICD-10-CM

## 2018-05-28 DIAGNOSIS — R0600 Dyspnea, unspecified: Secondary | ICD-10-CM

## 2018-05-28 DIAGNOSIS — E43 Unspecified severe protein-calorie malnutrition: Secondary | ICD-10-CM | POA: Diagnosis present

## 2018-05-28 DIAGNOSIS — Z992 Dependence on renal dialysis: Secondary | ICD-10-CM

## 2018-05-28 DIAGNOSIS — Z8249 Family history of ischemic heart disease and other diseases of the circulatory system: Secondary | ICD-10-CM

## 2018-05-28 DIAGNOSIS — Z9049 Acquired absence of other specified parts of digestive tract: Secondary | ICD-10-CM | POA: Diagnosis not present

## 2018-05-28 DIAGNOSIS — Z90710 Acquired absence of both cervix and uterus: Secondary | ICD-10-CM

## 2018-05-28 DIAGNOSIS — Z09 Encounter for follow-up examination after completed treatment for conditions other than malignant neoplasm: Secondary | ICD-10-CM

## 2018-05-28 DIAGNOSIS — E890 Postprocedural hypothyroidism: Secondary | ICD-10-CM | POA: Diagnosis present

## 2018-05-28 DIAGNOSIS — I82532 Chronic embolism and thrombosis of left popliteal vein: Secondary | ICD-10-CM | POA: Diagnosis present

## 2018-05-28 DIAGNOSIS — I120 Hypertensive chronic kidney disease with stage 5 chronic kidney disease or end stage renal disease: Secondary | ICD-10-CM | POA: Diagnosis present

## 2018-05-28 DIAGNOSIS — I2699 Other pulmonary embolism without acute cor pulmonale: Secondary | ICD-10-CM | POA: Diagnosis present

## 2018-05-28 DIAGNOSIS — R0602 Shortness of breath: Secondary | ICD-10-CM | POA: Diagnosis present

## 2018-05-28 DIAGNOSIS — I351 Nonrheumatic aortic (valve) insufficiency: Secondary | ICD-10-CM | POA: Diagnosis present

## 2018-05-28 LAB — CBC WITH DIFFERENTIAL/PLATELET
ABS IMMATURE GRANULOCYTES: 0.02 10*3/uL (ref 0.00–0.07)
Basophils Absolute: 0 10*3/uL (ref 0.0–0.1)
Basophils Relative: 1 %
EOS PCT: 8 %
Eosinophils Absolute: 0.3 10*3/uL (ref 0.0–0.5)
HCT: 35.7 % — ABNORMAL LOW (ref 36.0–46.0)
Hemoglobin: 10.5 g/dL — ABNORMAL LOW (ref 12.0–15.0)
Immature Granulocytes: 1 %
Lymphocytes Relative: 21 %
Lymphs Abs: 0.8 10*3/uL (ref 0.7–4.0)
MCH: 30.8 pg (ref 26.0–34.0)
MCHC: 29.4 g/dL — AB (ref 30.0–36.0)
MCV: 104.7 fL — ABNORMAL HIGH (ref 80.0–100.0)
Monocytes Absolute: 0.5 10*3/uL (ref 0.1–1.0)
Monocytes Relative: 13 %
NEUTROS ABS: 2.3 10*3/uL (ref 1.7–7.7)
Neutrophils Relative %: 56 %
Platelets: 106 10*3/uL — ABNORMAL LOW (ref 150–400)
RBC: 3.41 MIL/uL — AB (ref 3.87–5.11)
RDW: 16.3 % — AB (ref 11.5–15.5)
WBC: 4.1 10*3/uL (ref 4.0–10.5)
nRBC: 0 % (ref 0.0–0.2)

## 2018-05-28 LAB — COMPREHENSIVE METABOLIC PANEL
ALT: 9 U/L (ref 0–44)
ANION GAP: 9 (ref 5–15)
AST: 15 U/L (ref 15–41)
Albumin: 2.7 g/dL — ABNORMAL LOW (ref 3.5–5.0)
Alkaline Phosphatase: 49 U/L (ref 38–126)
BUN: 19 mg/dL (ref 8–23)
CHLORIDE: 105 mmol/L (ref 98–111)
CO2: 25 mmol/L (ref 22–32)
Calcium: 8.5 mg/dL — ABNORMAL LOW (ref 8.9–10.3)
Creatinine, Ser: 6.51 mg/dL — ABNORMAL HIGH (ref 0.44–1.00)
GFR calc Af Amer: 7 mL/min — ABNORMAL LOW (ref >60)
GFR, EST NON AFRICAN AMERICAN: 6 mL/min — AB (ref >60)
Glucose, Bld: 79 mg/dL (ref 70–99)
POTASSIUM: 3.1 mmol/L — AB (ref 3.5–5.1)
SODIUM: 139 mmol/L (ref 135–145)
Total Bilirubin: 0.5 mg/dL (ref 0.3–1.2)
Total Protein: 6.1 g/dL — ABNORMAL LOW (ref 6.5–8.1)

## 2018-05-28 LAB — BRAIN NATRIURETIC PEPTIDE: B Natriuretic Peptide: 186.4 pg/mL — ABNORMAL HIGH (ref 0.0–100.0)

## 2018-05-28 LAB — D-DIMER, QUANTITATIVE: D-Dimer, Quant: 3.38 ug/mL-FEU — ABNORMAL HIGH (ref 0.00–0.50)

## 2018-05-28 LAB — I-STAT TROPONIN, ED: Troponin i, poc: 0.01 ng/mL (ref 0.00–0.08)

## 2018-05-28 MED ORDER — CALCITRIOL 0.25 MCG PO CAPS: 0.2500 ug | ORAL_CAPSULE | ORAL | Status: DC

## 2018-05-28 MED ORDER — ALENDRONATE SODIUM 70 MG PO TABS: 70.0000 mg | ORAL_TABLET | ORAL | Status: DC

## 2018-05-28 MED ORDER — ADALIMUMAB 40 MG/0.8ML ~~LOC~~ PSKT: 40.0000 mg | PREFILLED_SYRINGE | SUBCUTANEOUS | Status: DC

## 2018-05-28 MED ORDER — LEVOTHYROXINE SODIUM 25 MCG PO TABS
125.0000 ug | ORAL_TABLET | Freq: Every day | ORAL | Status: DC
  Administered 2018-05-29 – 2018-06-06 (×9): 125 ug via ORAL
  Filled 2018-05-28 (×11): qty 1

## 2018-05-28 MED ORDER — ONDANSETRON HCL 4 MG PO TABS: 4.0000 mg | ORAL_TABLET | Freq: Four times a day (QID) | ORAL | Status: DC | PRN

## 2018-05-28 MED ORDER — SODIUM CHLORIDE 0.9 % IV SOLN: 250.0000 mL | INTRAVENOUS | Status: DC | PRN

## 2018-05-28 MED ORDER — PREDNISONE 5 MG PO TABS
5.0000 mg | ORAL_TABLET | Freq: Every day | ORAL | Status: DC
  Administered 2018-05-29 – 2018-06-06 (×9): 5 mg via ORAL
  Filled 2018-05-28 (×8): qty 1

## 2018-05-28 MED ORDER — ALBUTEROL SULFATE HFA 108 (90 BASE) MCG/ACT IN AERS
2.0000 | INHALATION_SPRAY | Freq: Four times a day (QID) | RESPIRATORY_TRACT | Status: DC | PRN
  Filled 2018-05-28: qty 6.7

## 2018-05-28 MED ORDER — ALBUTEROL SULFATE (2.5 MG/3ML) 0.083% IN NEBU: 2.5000 mg | INHALATION_SOLUTION | RESPIRATORY_TRACT | Status: DC | PRN

## 2018-05-28 MED ORDER — ALBUTEROL SULFATE (2.5 MG/3ML) 0.083% IN NEBU
2.5000 mg | INHALATION_SOLUTION | Freq: Four times a day (QID) | RESPIRATORY_TRACT | Status: DC
  Administered 2018-05-28: 2.5 mg via RESPIRATORY_TRACT
  Filled 2018-05-28: qty 3

## 2018-05-28 MED ORDER — METHYLPREDNISOLONE SODIUM SUCC 125 MG IJ SOLR
125.0000 mg | Freq: Once | INTRAMUSCULAR | Status: AC
  Administered 2018-05-28: 125 mg via INTRAVENOUS
  Filled 2018-05-28: qty 2

## 2018-05-28 MED ORDER — IPRATROPIUM-ALBUTEROL 0.5-2.5 (3) MG/3ML IN SOLN
3.0000 mL | RESPIRATORY_TRACT | Status: AC
  Administered 2018-05-28 (×3): 3 mL via RESPIRATORY_TRACT
  Filled 2018-05-28 (×3): qty 3

## 2018-05-28 MED ORDER — HEPARIN BOLUS VIA INFUSION
3000.0000 [IU] | Freq: Once | INTRAVENOUS | Status: AC
  Administered 2018-05-28: 3000 [IU] via INTRAVENOUS
  Filled 2018-05-28: qty 3000

## 2018-05-28 MED ORDER — ONDANSETRON HCL 4 MG/2ML IJ SOLN
4.0000 mg | Freq: Four times a day (QID) | INTRAMUSCULAR | Status: DC | PRN
  Administered 2018-06-06: 4 mg via INTRAVENOUS
  Filled 2018-05-28: qty 2

## 2018-05-28 MED ORDER — ACETAMINOPHEN 500 MG PO TABS
1000.0000 mg | ORAL_TABLET | Freq: Four times a day (QID) | ORAL | Status: DC | PRN
  Administered 2018-05-31 – 2018-06-01 (×2): 1000 mg via ORAL
  Filled 2018-05-28 (×2): qty 2

## 2018-05-28 MED ORDER — MIDODRINE HCL 5 MG PO TABS
5.0000 mg | ORAL_TABLET | ORAL | Status: DC
  Administered 2018-05-29 – 2018-05-31 (×3): 5 mg via ORAL
  Filled 2018-05-28 (×2): qty 1

## 2018-05-28 MED ORDER — IPRATROPIUM BROMIDE 0.02 % IN SOLN
0.5000 mg | Freq: Four times a day (QID) | RESPIRATORY_TRACT | Status: DC
  Administered 2018-05-28: 0.5 mg via RESPIRATORY_TRACT
  Filled 2018-05-28: qty 2.5

## 2018-05-28 MED ORDER — IPRATROPIUM-ALBUTEROL 0.5-2.5 (3) MG/3ML IN SOLN
3.0000 mL | Freq: Two times a day (BID) | RESPIRATORY_TRACT | Status: AC
  Administered 2018-05-29 – 2018-05-30 (×3): 3 mL via RESPIRATORY_TRACT
  Filled 2018-05-28 (×3): qty 3

## 2018-05-28 MED ORDER — HYDROCODONE-ACETAMINOPHEN 5-325 MG PO TABS
1.0000 | ORAL_TABLET | Freq: Four times a day (QID) | ORAL | Status: DC | PRN
  Administered 2018-05-28 – 2018-06-06 (×10): 1 via ORAL
  Filled 2018-05-28 (×9): qty 1

## 2018-05-28 MED ORDER — ONDANSETRON 4 MG PO TBDP: 4.0000 mg | ORAL_TABLET | Freq: Three times a day (TID) | ORAL | Status: DC | PRN

## 2018-05-28 MED ORDER — HEPARIN (PORCINE) 25000 UT/250ML-% IV SOLN
900.0000 [IU]/h | INTRAVENOUS | Status: DC
  Administered 2018-05-28 – 2018-06-01 (×4): 800 [IU]/h via INTRAVENOUS
  Administered 2018-06-02 (×2): 850 [IU]/h via INTRAVENOUS
  Administered 2018-06-03 – 2018-06-05 (×2): 900 [IU]/h via INTRAVENOUS
  Filled 2018-05-28 (×7): qty 250

## 2018-05-28 MED ORDER — SODIUM CHLORIDE 0.9% FLUSH: 3.0000 mL | INTRAVENOUS | Status: DC | PRN

## 2018-05-28 MED ORDER — SODIUM CHLORIDE 0.9% FLUSH
3.0000 mL | Freq: Two times a day (BID) | INTRAVENOUS | Status: DC
  Administered 2018-05-29 – 2018-06-06 (×10): 3 mL via INTRAVENOUS

## 2018-05-28 MED ORDER — CYANOCOBALAMIN 1000 MCG/ML IJ SOLN: 1000.0000 ug | INTRAMUSCULAR | Status: DC

## 2018-05-28 MED ORDER — IOPAMIDOL (ISOVUE-370) INJECTION 76%
INTRAVENOUS | Status: AC
  Administered 2018-05-28: 100 mL via INTRAVENOUS
  Filled 2018-05-28: qty 100

## 2018-05-28 NOTE — Progress Notes (Signed)
ANTICOAGULATION CONSULT NOTE - Initial Consult  Pharmacy Consult for Heparin Indication: Pulmonary embolism  Allergies  Allergen Reactions  . Mercaptopurine Other (See Comments)    Caused pancreatitis  . Remicade [Infliximab] Other (See Comments)    CAUSED JOINT PAIN    Patient Measurements:   Heparin Dosing Weight: 44.5 kg  (patient stated weight)  Vital Signs: Temp: 98.3 F (36.8 C) (01/03 1258) Temp Source: Oral (01/03 1258) BP: 130/70 (01/03 1545) Pulse Rate: 111 (01/03 1530)  Labs: Recent Labs    05/28/18 1342  HGB 10.5*  HCT 35.7*  PLT 106*  CREATININE 6.51*    CrCl cannot be calculated (Unknown ideal weight.).   Medical History: Past Medical History:  Diagnosis Date  . Anal fistula   . Anemia in chronic kidney disease (CKD)   . Aortic insufficiency    Echo 3/18: mod conc LVH, EF 60-65, no RWMA, Gr 1 DD, mod AI, mild MR, normal RVSF, Trivial TR  . Arthritis   . Borderline hypertension   . Bulging disc    L3-L4  . Chronic diarrhea    due to crohn's  . CKD (chronic kidney disease), stage IV ([place])    MWF- [address]  . Crohn's disease ([place])    chronic ileitis  . Dyspnea    with exertion  . Emphysema/COPD ([place])   . GERD (gastroesophageal reflux disease)    denies  . History of blood transfusion   . History of glaucoma   . History of kidney stones   . History of pancreatitis    2008--  mercaptopurine  . History of small bowel obstruction    12-03-2010  due to crohn's ileitis  . Hypertension    no longer on medications  . Hypothyroidism, postsurgical    multinodule w/ hurthle cells  . Osteoporosis   . Perianal Crohn's disease ([place])   . Peripheral vascular disease ([place])    blood clot behind knee left leg  . Polyarthralgia   . Seizures ([place])    03/2017  . Wears partial dentures    upper   Assessment: 73 yo female with h/o COPD, ESRD on HD admitted with complaints of SOA. CT evidence of PE found without heart strain. Pharmacy consulted  to start heparin drip. No anticoagulation noted PTA  Goal of Therapy:  Heparin level 0.3-0.7 units/ml Monitor platelets by anticoagulation protocol: Yes   Plan:  Heparin 3000 units IV x 1 Heparin 800 units/hr Heparin level in 8 hours Daily Heparin level and CBC Monitor for bleeding.   Dionisios Ricci A. Levada Dy, PharmD, [place] Pager: [phone] Please utilize Amion for appropriate phone number to reach the unit pharmacist ([place])   05/28/2018,5:02 PM

## 2018-05-28 NOTE — Progress Notes (Signed)
PHARMACIST - PHYSICIAN COMMUNICATION  CONCERNING: P&T Medication Policy Regarding Oral Bisphosphonates  RECOMMENDATION: Your order for alendronate (Fosamax), ibandronate (Boniva), or risedronate (Actonel) has been discontinued at this time.  If the patient's post-hospital medical condition warrants safe use of this class of drugs, please resume the pre-hospital regimen upon discharge.  DESCRIPTION:  Alendronate (Fosamax), ibandronate (Boniva), and risedronate (Actonel) can cause severe esophageal erosions in patients who are unable to remain upright at least 30 minutes after taking this medication.   Since brief interruptions in therapy are thought to have minimal impact on bone mineral density, the [facility] has established that bisphosphonate orders should be routinely discontinued during hospitalization.   To override this safety policy and permit administration of Boniva, Fosamax, or Actonel in the hospital, prescribers must write "DO NOT HOLD" in the comments section when placing the order for this class of medications.  Lashan Gluth A. Levada Dy, PharmD, [place] Pager: [phone] Please utilize Amion for appropriate phone number to reach the unit pharmacist ([place])

## 2018-05-28 NOTE — H&P (Signed)
[facility]                                                                                    Patient Demographics  Natasha [name], is a 73 y.o. female  CSN: [id number]  MRN: [id number]  DOB - [birth year]  Admit Date - 05/28/2018  Outpatient Primary MD for the patient is [name], MD   With History of -  Past Medical History:  Diagnosis Date  . Anal fistula   . Anemia in chronic kidney disease (CKD)   . Aortic insufficiency    Echo 3/18: mod conc LVH, EF 60-65, no RWMA, Gr 1 DD, mod AI, mild MR, normal RVSF, Trivial TR  . Arthritis   . Borderline hypertension   . Bulging disc    L3-L4  . Chronic diarrhea    due to crohn's  . CKD (chronic kidney disease), stage IV ([place])    MWF- [address]  . Crohn's disease ([place])    chronic ileitis  . Dyspnea    with exertion  . Emphysema/COPD ([place])   . GERD (gastroesophageal reflux disease)    denies  . History of blood transfusion   . History of glaucoma   . History of kidney stones   . History of pancreatitis    2008--  mercaptopurine  . History of small bowel obstruction    12-03-2010  due to crohn's ileitis  . Hypertension    no longer on medications  . Hypothyroidism, postsurgical    multinodule w/ hurthle cells  . Osteoporosis   . Perianal Crohn's disease ([place])   . Peripheral vascular disease ([place])    blood clot behind knee left leg  . Polyarthralgia   . Seizures ([place])    03/2017  . Wears partial dentures    upper      Past Surgical History:  Procedure Laterality Date  . ABDOMINAL HYSTERECTOMY  1990   and  Appendectomy  . AV FISTULA PLACEMENT Left 12/08/2017   Procedure: INSERTION OF ARTERIOVENOUS (AV) GRAFT WITH ARTEGRAFT TO LEFT UPPER ARM;  Surgeon: Conrad , MD;  Location: [place];  Service: Vascular;  Laterality: Left;  . BASCILIC VEIN TRANSPOSITION Left 01/09/2017   Procedure: LEFT 1ST STAGE BRACHIAL VEIN TRANSPOSITION;  Surgeon: Conrad , MD;  Location: [place];   Service: Vascular;  Laterality: Left;  . BASCILIC VEIN TRANSPOSITION Left 06/16/2017   Procedure: Second Stage BASILIC VEIN TRANSPOSITION  LEFT ARM;  Surgeon: Conrad , MD;  Location: [facility];  Service: Vascular;  Laterality: Left;  . BIOPSY  03/09/2018   Procedure: BIOPSY;  Surgeon: Wilford Corner, MD;  Location: [facility];  Service: Endoscopy;;  . CHOLECYSTECTOMY    . COLON RESECTION  x3 --  1978,  1987, 1989   ILEAL RESECTION x2/   [place]  . COLONOSCOPY WITH PROPOFOL N/A 09/25/2014   Procedure: COLONOSCOPY WITH PROPOFOL;  Surgeon: Garlan Fair, MD;  Location: [facility];  Service: Endoscopy;  Laterality: N/A;  . COLONOSCOPY WITH PROPOFOL N/A 03/09/2018   Procedure: COLONOSCOPY WITH PROPOFOL;  Surgeon: Wilford Corner, MD;  [facility]: [facility];  Service: Endoscopy;  Laterality: N/A;  .  CYSTOSCOPY W/ URETERAL STENT PLACEMENT Right 11/19/2016   Procedure: CYSTOSCOPY WITH RIGHT RETROGRADE PYELOGRAM/ URETEROSCOPY WITH LASER AND RIGHT URETERAL STENT PLACEMENT;  Surgeon: Ardis Hughs, MD;  Location: [facility];  Service: Urology;  Laterality: Right;  . ESOPHAGOGASTRODUODENOSCOPY  03/04/2012   Procedure: ESOPHAGOGASTRODUODENOSCOPY (EGD);  Surgeon: Garlan Fair, MD;  Location: [facility];  Service: Endoscopy;  Laterality: N/A;  . EXAMINATION UNDER ANESTHESIA N/A 09/12/2014   Procedure: EXAM UNDER ANESTHESIA;  Surgeon: Rolm Bookbinder, MD;  Location: [place];  Service: General;  Laterality: N/A;  . EYE SURGERY     laser  . FLEXIBLE SIGMOIDOSCOPY  03/04/2012   Procedure: FLEXIBLE SIGMOIDOSCOPY;  Surgeon: Garlan Fair, MD;  [facility];  Service: Endoscopy;  Laterality: N/A;  . GLAUCOMA SURGERY Bilateral   . HOLMIUM LASER APPLICATION Right 0/02/9322   Procedure: HOLMIUM LASER APPLICATION;  Surgeon: Ardis Hughs, MD;  Location: [facility];  Service: Urology;  Laterality: Right;  . INCISION AND DRAINAGE PERIRECTAL ABSCESS N/A 09/12/2014    Procedure: IRRIGATION AND DEBRIDEMENT PERIRECTAL ABSCESS;  Surgeon: Rolm Bookbinder, MD;  Location: [place];  Service: General;  Laterality: N/A;  . IR FLUORO GUIDE CV LINE RIGHT  03/20/2017  . IR US GUIDE VASC ACCESS RIGHT  03/20/2017  . NEGATIVE SLEEP STUDY  2008  . PLACEMENT OF SETON N/A 12/01/2014   Procedure: PLACEMENT OF SETON;  Surgeon: [name], MD;  Location[facility];  Service: General;  Laterality: N/A;  . POLYPECTOMY  03/09/2018   Procedure: POLYPECTOMY;  Surgeon: Wilford Corner, MD;  Location[facility];  Service: Endoscopy;;  . RECTAL EXAM UNDER ANESTHESIA N/A 12/01/2014   Procedure: RECTAL EXAM UNDER ANESTHESIA;  Surgeon: [name], MD;  Location: [facility];  Service: General;  Laterality: N/A;  . TOTAL THYROIDECTOMY  10-13-2003  . TRANSTHORACIC ECHOCARDIOGRAM  07-11-2013   mild LVH,  ef 55%,  moderate AR,  mild MR and TR,  trivial PR  . TUBAL LIGATION      in for   Chief Complaint  Patient presents with  . Shortness of Breath     HPI  Natasha [name]  is a 73 y.o. female, with past medical history significant for end-stage renal disease on hemodialysis, history of COPD and history of DVT 6 years ago status post treatment with Coumadin for few months after which it was stopped.  Patient is presenting with 2 days history of increasing shortness of breath.  No chest pain, fever or chills.  Patient was having hemodialysis  where she was noted to be struggling for air and was sent to the emergency room for evaluation. Emergency room work-up showed left upper lobe PE, but no right ventricular strain.     Review of Systems    In addition to the HPI above,  No Fever-chills, No Headache, No changes with Vision or hearing, No problems swallowing food or Liquids, No Chest pain, No Abdominal pain, No Nausea or Vommitting, Bowel movements are regular, No Blood in stool or Urine, No dysuria, No new skin rashes or bruises, No new  joints pains-aches,  No new weakness, tingling, numbness in any extremity, No recent weight gain or loss, No polyuria, polydypsia or polyphagia, No significant Mental Stressors.  A full 10 point Review of Systems was done, except as stated above, all other Review of Systems were negative.   Social History Social History   Tobacco Use  . Smoking status: Former Smoker    Packs/day: 0.25  Years: 15.00    Pack years: 3.75    Types: Cigarettes    Last attempt to quit: 11/27/2001    Years since quitting: 16.5  . Smokeless tobacco: Never Used  Substance Use Topics  . Alcohol use: No     Family History Family History  Problem Relation Age of Onset  . Hypertension Father   . Heart disease Father   . Heart attack Father   . Diverticulitis Mother   . COPD Mother   . Hypertension Mother   . Heart disease Mother   . Heart attack Mother   . Heart attack Brother   . Colon cancer Brother   . Diabetes Brother   . Thyroid disease Brother      Prior to Admission medications   Medication Sig Start Date End Date Taking? Authorizing Provider  acetaminophen (TYLENOL) 500 MG tablet Take 1,000 mg by mouth every 6 (six) hours as needed for mild pain.    Yes [provider]  Adalimumab (HUMIRA) 40 MG/0.8ML PSKT Inject 40 mg into the skin every 14 (fourteen) days.    Yes [provider]  albuterol (PROVENTIL HFA;VENTOLIN HFA) 108 (90 Base) MCG/ACT inhaler Inhale 2 puffs into the lungs every 6 (six) hours as needed for wheezing or shortness of breath. 06/09/17  Yes Chesley Mires, MD  alendronate (FOSAMAX) 70 MG tablet Take 70 mg by mouth every Monday.  11/06/16  Yes [provider]  calcitRIOL (ROCALTROL) 0.25 MCG capsule Take 0.25 mcg by mouth every Monday, Wednesday, and Friday with hemodialysis. 02/03/17  Yes [provider]  cyanocobalamin (,VITAMIN B-12,) 1000 MCG/ML injection Inject 1,000 mcg into the muscle every 30 (thirty) days.    Yes [provider]  HYDROcodone-acetaminophen (NORCO) 5-325 MG tablet Take 1 tablet by mouth every 6 (six) hours as needed for moderate pain. 12/08/17  Yes Dagoberto Ligas, PA-C  levothyroxine (SYNTHROID, LEVOTHROID) 125 MCG tablet Take 125 mcg by mouth daily before breakfast.    Yes [provider]  loperamide (IMODIUM) 2 MG capsule Take 2 capsules (4 mg total) by mouth as needed for diarrhea or loose stools. Patient taking differently: Take 4 mg by mouth every Monday, Wednesday, and Friday.  03/27/17  Yes Caren Griffins, MD  midodrine (PROAMATINE) 5 MG tablet Take 5 mg by mouth every Monday, Wednesday, and Friday. 5 mg PRIOR TO dialysis on Mon/Wed/Fri 04/30/17  Yes [provider]  ondansetron (ZOFRAN ODT) 4 MG disintegrating tablet Take 1 tablet (4 mg total) by mouth every 8 (eight) hours as needed for nausea or vomiting. 03/29/18  Yes Deno Etienne, DO  OXYGEN Inhale 2-3 L into the lungs See admin instructions. Use every night and during the day as needed for shortness of breath   Yes [provider]  predniSONE (DELTASONE) 5 MG tablet Take 5 mg by mouth daily with breakfast.   Yes [provider]  sodium bicarbonate 650 MG tablet Take 2 tablets (1,300 mg total) by mouth 2 (two) times daily. Patient taking differently: Take 1,300 mg by mouth daily.  11/21/16  Yes Mariel Aloe, MD  TRELEGY ELLIPTA 100-62.5-25 MCG/INH AEPB INHALE 1 PUFF BY MOUTH ONCE DAILY Patient taking differently: Inhale 1 puff into the lungs daily.  04/19/18  Yes Chesley Mires, MD  dextromethorphan (DELSYM) 30 MG/5ML liquid Take 5 mLs (30 mg total) by mouth 2 (two) times daily as needed for cough. Patient not taking: Reported on 05/28/2018 04/02/18   Lavina Hamman, MD  guaiFENesin [facility])  600 MG 12 hr tablet Take 1 tablet (600 mg total) by mouth 2 (two) times daily. Patient not taking: Reported on 05/28/2018 04/02/18   Lavina Hamman, MD  nitroGLYCERIN (NITROSTAT) 0.4 MG SL tablet Place 1 tablet  (0.4 mg total) under the tongue every 5 (five) minutes as needed for chest pain. Patient not taking: Reported on 05/28/2018 08/19/16 03/31/26  Richardson Dopp T, PA-C    Allergies  Allergen Reactions  . Mercaptopurine Other (See Comments)    Caused pancreatitis  . Remicade [Infliximab] Other (See Comments)    CAUSED JOINT PAIN    Physical Exam  Vitals  Blood pressure 130/70, pulse (!) 111, temperature 98.3 F (36.8 C), temperature source Oral, resp. rate (!) 22, SpO2 100 %.   1. General extremely pleasant female, well-developed, well-nourished  2. Normal affect and insight, Not Suicidal or Homicidal, Awake Alert, Oriented X 3.  3. No F.N deficits, grossly, patient moving all extremities.  4. Ears and Eyes appear Normal, Conjunctivae clear, PERRLA. Moist Oral Mucosa.  5. Supple Neck, No JVD, No cervical lymphadenopathy appriciated, No Carotid Bruits.  6. Symmetrical Chest wall movement , mild scattered rhonchi.  7. RRR, No Gallops, Rubs or Murmurs, No Parasternal Heave.  8. Positive Bowel Sounds, Abdomen Soft, Non tender, No organomegaly appriciated,No rebound -guarding or rigidity.  9.  No Cyanosis, Normal Skin Turgor, No Skin Rash or Bruise.  10. Good muscle tone,  joints appear normal , no effusions, Normal ROM.    Data Review  CBC Recent Labs  Lab 05/28/18 1342  WBC 4.1  HGB 10.5*  HCT 35.7*  PLT 106*  MCV 104.7*  MCH 30.8  MCHC 29.4*  RDW 16.3*  LYMPHSABS 0.8  MONOABS 0.5  EOSABS 0.3  BASOSABS 0.0   ------------------------------------------------------------------------------------------------------------------  Chemistries  Recent Labs  Lab 05/28/18 1342  NA 139  K 3.1*  CL 105  CO2 25  GLUCOSE 79  BUN 19  CREATININE 6.51*  CALCIUM 8.5*  AST 15  ALT 9  ALKPHOS 49  BILITOT 0.5   ------------------------------------------------------------------------------------------------------------------ CrCl cannot be calculated (Unknown ideal  weight.). ------------------------------------------------------------------------------------------------------------------ No results for input(s): TSH, T4TOTAL, T3FREE, THYROIDAB in the last 72 hours.  Invalid input(s): FREET3   Coagulation profile No results for input(s): INR, PROTIME in the last 168 hours. ------------------------------------------------------------------------------------------------------------------- Recent Labs    05/28/18 1342  DDIMER 3.38*   -------------------------------------------------------------------------------------------------------------------  Cardiac Enzymes No results for input(s): CKMB, TROPONINI, MYOGLOBIN in the last 168 hours.  Invalid input(s): CK ------------------------------------------------------------------------------------------------------------------ Invalid input(s): POCBNP   ---------------------------------------------------------------------------------------------------------------  Urinalysis    Component Value Date/Time   COLORURINE YELLOW 03/19/2017 1934   APPEARANCEUR HAZY (A) 03/19/2017 1934   LABSPEC 1.006 03/19/2017 1934   PHURINE 5.0 03/19/2017 1934   GLUCOSEU NEGATIVE 03/19/2017 1934   HGBUR MODERATE (A) 03/19/2017 1934   BILIRUBINUR NEGATIVE 03/19/2017 1934   KETONESUR NEGATIVE 03/19/2017 1934   PROTEINUR 30 (A) 03/19/2017 1934   UROBILINOGEN 0.2 09/09/2014 0850   NITRITE NEGATIVE 03/19/2017 1934   LEUKOCYTESUR NEGATIVE 03/19/2017 1934    ----------------------------------------------------------------------------------------------------------------   Imaging results:   Dg Chest 2 View  Result Date: 05/28/2018 CLINICAL DATA:  Shortness of breath. EXAM: CHEST - 2 VIEW COMPARISON:  03/31/2018. FINDINGS: Mediastinum and hilar structures normal. Heart size normal. Diffuse mild bilateral from interstitial prominence. These findings are noted on multiple prior exams most likely related chronic  interstitial lung disease. And active interstitial process including interstitial edema and/or pneumonitis could not be excluded. No pleural effusion or pneumothorax. No acute  bony abnormality. IMPRESSION: Diffuse mild bilateral pulmonary interstitial prominence. These changes are most likely related chronic interstitial lung disease. And active interstitial process including interstitial edema and/or pneumonitis could not be excluded. Electronically Signed   By: Marcello Moores  Register   On: 05/28/2018 14:00   Ct Angio Chest Pe W And/or Wo Contrast  Result Date: 05/28/2018 CLINICAL DATA:  Shortness of breath. EXAM: CT ANGIOGRAPHY CHEST WITH CONTRAST TECHNIQUE: Multidetector CT imaging of the chest was performed using the standard protocol during bolus administration of intravenous contrast. Multiplanar CT image reconstructions and MIPs were obtained to evaluate the vascular anatomy. CONTRAST:  100 mL ISOVUE-370 IOPAMIDOL (ISOVUE-370) INJECTION 76% COMPARISON:  Single-view of the chest 03/31/2018. PA and lateral chest today. FINDINGS: Cardiovascular: A single embolus is seen in a segmental branch in the left upper lobe. There is no evidence of right heart strain. No aortic aneurysm. Heart size is mildly enlarged. No pericardial effusion. Mediastinum/Nodes: No enlarged mediastinal, hilar, or axillary lymph nodes. The patient is status post thyroidectomy. Trachea and esophagus demonstrate no significant findings. Lungs/Pleura: The lungs demonstrate extensive emphysematous disease. No pulmonary infarct is identified. No consolidative process, pneumothorax or effusion. No mass. Upper Abdomen: Nonobstructing bilateral renal stones are seen. The patient is status post cholecystectomy. Musculoskeletal: No acute or focal abnormality. Review of the MIP images confirms the above findings. IMPRESSION: Examination is positive for pulmonary embolus. No evidence of right heart strain. Cardiomegaly. Nonobstructing renal stones.  Critical Value/emergent results were called by telephone at the time of interpretation on 05/28/2018 at 4:29 pm to Dr. Ronnald Nian, who verbally acknowledged these results. Emphysema (ICD10-J43.9). Electronically Signed   By: Inge Rise M.D.   On: 05/28/2018 16:32    My personal review of EKG: Rhythm NSR, Rate 95 bpm, old anteroseptal MI  Assessment & Plan  Pulmonary embolus, left upper lobe, no ventricular strain Start heparin drip/ Lower extremity Dopplers  History of COPD Continue with prednisone Nebulizer treatments  ESRD Continue hemodialysis, MWF  Hypothyroidism continue with Synthroid   DVT Prophylaxis Heparin treatment zone  AM Labs Ordered, also please review Full Orders  Family Communication: Admission, patients condition and plan of care including tests being ordered have been discussed with the patient and husband who indicate understanding and agree with the plan and Code Status.  Code Status full  Disposition Plan: Home  Time spent in minutes : 38 minutes  Condition GUARDED   @SIGNATURE @

## 2018-05-28 NOTE — ED Triage Notes (Signed)
Pt arrived via EMS from HD center due to having increasing SOB during HD treatment. Per pt, SOB started last night. Pt wears O2 as needed at home. Pt completed 1 hr of HD treatment. Pt 86% on 2L on arrival.

## 2018-05-28 NOTE — ED Provider Notes (Signed)
I assumed care of this patient from Dr. Billy Fischer at 3 pm.  Please see their note for further details of Hx, PE.  Briefly patient is a 73 y.o. female who presented with sob, hypoxia while at dialysis, had dialysis for one hour and got worse with breathing, concern for COPD exacerbation with volume overload.   Current plan is to f/u CT PE scan and re-eval after breathing treatments.   Patient with pulmonary embolism as told me over the phone with radiology.  No signs of right heart strain.  Given multifactorial shortness of breath with likely COPD exacerbation, volume overload, PE will admit to the hospital for further care.  We will start the patient on IV heparin for PE.  She was on Coumadin in the past for DVT.  She will need to be transitioned to oral medication.  Overall she feels improved following breathing treatments and steroids.  Hemodynamically stable throughout my care.  Admitted to the hospitalist service in good condition.  This chart was dictated using voice recognition software.  Despite best efforts to proofread,  errors can occur which can change the documentation meaning.   .Critical Care Performed by: Lennice Sites, DO Authorized by: Lennice Sites, DO   Critical care provider statement:    Critical care time (minutes):  35   Critical care time was exclusive of:  Separately billable procedures and treating other patients and teaching time   Critical care was necessary to treat or prevent imminent or life-threatening deterioration of the following conditions:  Respiratory failure   Critical care was time spent personally by me on the following activities:  Development of treatment plan with patient or surrogate, discussions with primary provider, evaluation of patient's response to treatment, obtaining history from patient or surrogate, ordering and performing treatments and interventions, ordering and review of laboratory studies, ordering and review of radiographic studies,  pulse oximetry, re-evaluation of patient's condition and review of old charts   I assumed direction of critical care for this patient from another provider in my specialty: yes    d    Lennice Sites, DO 05/28/18 1726

## 2018-05-28 NOTE — ED Provider Notes (Signed)
[facility] EMERGENCY DEPARTMENT Provider Note   CSN: [id number] Arrival date & time: 05/28/18  1248     History   Chief Complaint Chief Complaint  Patient presents with  . Shortness of Breath    HPI Natasha [name] is a 73 y.o. female.  HPI  73yo female with history of COPD on 2-3L O2 per , hypertension, ESRD on dialysis MWF, Crohn's  The day after new years could tell dyspnea getting worse, had to wear o2 on 3, this AM and last night could tell something wasn't right, even with the O2 on at home was really struggling to breath.  Went to dialysis and they saw her struggling when she arrived and despite starting dialysis she kept getting worse so they called the ambulance.   Started coughing 2-3 days, coughing up white mucus No fever Had some chest pain Wednesday but sat down and it went away, not having any now Can walk from kitchen to bedroom and is severe short of breath and today was dyspnea at rest  Has been wheezing, inhalers help some then it comes back Has chronic orthopnea  Past Medical History:  Diagnosis Date  . Anal fistula   . Anemia in chronic kidney disease (CKD)   . Aortic insufficiency    Echo 3/18: mod conc LVH, EF 60-65, no RWMA, Gr 1 DD, mod AI, mild MR, normal RVSF, Trivial TR  . Arthritis   . Borderline hypertension   . Bulging disc    L3-L4  . Chronic diarrhea    due to crohn's  . CKD (chronic kidney disease), stage IV ([place])    MWF- [address]  . Crohn's disease ([place])    chronic ileitis  . Dyspnea    with exertion  . Emphysema/COPD ([place])   . GERD (gastroesophageal reflux disease)    denies  . History of blood transfusion   . History of glaucoma   . History of kidney stones   . History of pancreatitis    2008--  mercaptopurine  . History of small bowel obstruction    12-03-2010  due to crohn's ileitis  . Hypertension    no longer on medications  . Hypothyroidism, postsurgical    multinodule w/ hurthle cells    . Osteoporosis   . Perianal Crohn's disease ([place])   . Peripheral vascular disease ([place])    blood clot behind knee left leg  . Polyarthralgia   . Seizures ([place])    03/2017  . Wears partial dentures    upper    Patient Active Problem List   Diagnosis Date Noted  . COPD with acute exacerbation ([place]) 03/31/2018  . Acute sinusitis 03/31/2018  . Crohn's disease of colon with complication ([place])   . Aortic insufficiency 07/30/2016  . Atherosclerosis of aorta ([place]) 07/30/2016  . Pressure injury of skin 07/14/2016  . Gastroenteritis, acute 07/12/2016  . COPD (chronic obstructive pulmonary disease) ([place]) 10/12/2015  . Malnutrition of moderate degree ([place]) 09/13/2014  . history of Left leg DVT 09/10/2014  . Hypotension 09/10/2014  . Hypoglycemia 09/10/2014  . Ischiorectal abscess 09/09/2014  . Increased anion gap metabolic acidosis   . Generalized abdominal pain   . Chronic diastolic CHF (congestive heart failure) ([place])   . Hypertensive heart disease   . Secondary hyperparathyroidism ([place])   . Other specified hypothyroidism   . Right shoulder pain   . Thrombocytopenia ([place])   . Severe protein-calorie malnutrition ([place])   . Thyroid activity decreased   .  Emphysema of lung ([place])   . Nephrolithiasis 03/29/2014  . Spinal stenosis of lumbar region 11/01/2013  . Bilateral leg pain 09/28/2013  . Emphysema/COPD ([place])   . Acute dyspnea 09/20/2013  . Protein-calorie malnutrition, severe ([place]) 05/14/2013  . Salmonella enteritidis 06/11/2012  . Hypocalcemia 06/10/2012  . Hypothyroidism 06/10/2012  . Anemia of chronic disease 06/09/2012  . Hypomagnesemia 06/09/2012  . ESRD on dialysis ([place]) 06/09/2012  . Dehydration 06/07/2012  . Crohn's disease without complication ([place]) 37/62/8315  . Metabolic acidosis 17/61/6073  . Hypokalemia 06/07/2012  . Nausea vomiting and diarrhea 06/07/2012  . UTI (lower urinary tract infection) 06/07/2012    Past Surgical History:  Procedure Laterality  Date  . ABDOMINAL HYSTERECTOMY  1990   and  Appendectomy  . AV FISTULA PLACEMENT Left 12/08/2017   Procedure: INSERTION OF ARTERIOVENOUS (AV) GRAFT WITH ARTEGRAFT TO LEFT UPPER ARM;  Surgeon: Conrad Winston, MD;  Location: [facility];  Service: Vascular;  Laterality: Left;  . BASCILIC VEIN TRANSPOSITION Left 01/09/2017   Procedure: LEFT 1ST STAGE BRACHIAL VEIN TRANSPOSITION;  Surgeon: Conrad Paradise, MD;  Location: [facility];  Service: Vascular;  Laterality: Left;  . BASCILIC VEIN TRANSPOSITION Left 06/16/2017   Procedure: Second Stage BASILIC VEIN TRANSPOSITION  LEFT ARM;  Surgeon: Conrad Leipsic, MD;  Location: [facility];  Service: Vascular;  Laterality: Left;  . BIOPSY  03/09/2018   Procedure: BIOPSY;  Surgeon: Wilford Corner, MD;  Location: [facility];  Service: Endoscopy;;  . CHOLECYSTECTOMY    . COLON RESECTION  x3 --  1978,  1987, 1989   ILEAL RESECTION x2/   [place]  . COLONOSCOPY WITH PROPOFOL N/A 09/25/2014   Procedure: COLONOSCOPY WITH PROPOFOL;  Surgeon: Garlan Fair, MD;  Location: [facility];  Service: Endoscopy;  Laterality: N/A;  . COLONOSCOPY WITH PROPOFOL N/A 03/09/2018   Procedure: COLONOSCOPY WITH PROPOFOL;  Surgeon: Wilford Corner, MD;  [facility];  Service: Endoscopy;  Laterality: N/A;  . CYSTOSCOPY W/ URETERAL STENT PLACEMENT Right 11/19/2016   Procedure: CYSTOSCOPY WITH RIGHT RETROGRADE PYELOGRAM/ URETEROSCOPY WITH LASER AND RIGHT URETERAL STENT PLACEMENT;  Surgeon: Ardis Hughs, MD;  Location: [facility];  Service: Urology;  Laterality: Right;  . ESOPHAGOGASTRODUODENOSCOPY  03/04/2012   Procedure: ESOPHAGOGASTRODUODENOSCOPY (EGD);  Surgeon: Garlan Fair, MD;  [facility];  Service: Endoscopy;  Laterality: N/A;  . EXAMINATION UNDER ANESTHESIA N/A 09/12/2014   Procedure: EXAM UNDER ANESTHESIA;  Surgeon: Rolm Bookbinder, MD;  Location: [facility];  Service: General;  Laterality: N/A;  . EYE SURGERY     laser  . FLEXIBLE SIGMOIDOSCOPY   03/04/2012   Procedure: FLEXIBLE SIGMOIDOSCOPY;  Surgeon: Garlan Fair, MD;  [facility];  Service: Endoscopy;  Laterality: N/A;  . GLAUCOMA SURGERY Bilateral   . HOLMIUM LASER APPLICATION Right 12/03/6267   Procedure: HOLMIUM LASER APPLICATION;  Surgeon: Ardis Hughs, MD;  Location: [facility];  Service: Urology;  Laterality: Right;  . INCISION AND DRAINAGE PERIRECTAL ABSCESS N/A 09/12/2014   Procedure: IRRIGATION AND DEBRIDEMENT PERIRECTAL ABSCESS;  Surgeon: Rolm Bookbinder, MD;  Location: [facility];  Service: General;  Laterality: N/A;  . IR FLUORO GUIDE CV LINE RIGHT  03/20/2017  . IR US GUIDE VASC ACCESS RIGHT  03/20/2017  . NEGATIVE SLEEP STUDY  2008  . PLACEMENT OF SETON N/A 12/01/2014   Procedure: PLACEMENT OF SETON;  Surgeon: [name], MD;  Location: [facility];  Service: General;  Laterality: N/A;  . POLYPECTOMY  03/09/2018  Procedure: POLYPECTOMY;  Surgeon: Wilford Corner, MD;  [facility];  Service: Endoscopy;;  . RECTAL EXAM UNDER ANESTHESIA N/A 12/01/2014   Procedure: RECTAL EXAM UNDER ANESTHESIA;  Surgeon: [name], MD;  [facility];  Service: General;  Laterality: N/A;  . TOTAL THYROIDECTOMY  10-13-2003  . TRANSTHORACIC ECHOCARDIOGRAM  07-11-2013   mild LVH,  ef 55%,  moderate AR,  mild MR and TR,  trivial PR  . TUBAL LIGATION       OB History   No obstetric history on file.      Home Medications    Prior to Admission medications   Medication Sig Start Date End Date Taking? Authorizing Provider  acetaminophen (TYLENOL) 500 MG tablet Take 500 mg by mouth every 6 (six) hours as needed for mild pain.     [provider]  Adalimumab (HUMIRA) 40 MG/0.8ML PSKT Inject 40 mg into the skin every 14 (fourteen) days.    [provider]  albuterol (PROVENTIL HFA;VENTOLIN HFA) 108 (90 Base) MCG/ACT inhaler Inhale 2 puffs into the lungs every 6 (six) hours as needed for wheezing or  shortness of breath. 06/09/17   Chesley Mires, MD  alendronate (FOSAMAX) 70 MG tablet Take 70 mg by mouth every Monday.  11/06/16   [provider]  calcitRIOL (ROCALTROL) 0.25 MCG capsule Take 0.25 mcg by mouth every Monday, Wednesday, and Friday with hemodialysis. 02/03/17   [provider]  cyanocobalamin (,VITAMIN B-12,) 1000 MCG/ML injection Inject 1,000 mcg into the muscle every 30 (thirty) days.     [provider]  dextromethorphan (DELSYM) 30 MG/5ML liquid Take 5 mLs (30 mg total) by mouth 2 (two) times daily as needed for cough. 04/02/18   Lavina Hamman, MD  guaiFENesin (MUCINEX) 600 MG 12 hr tablet Take 1 tablet (600 mg total) by mouth 2 (two) times daily. 04/02/18   Lavina Hamman, MD  HYDROcodone-acetaminophen (NORCO) 5-325 MG tablet Take 1 tablet by mouth every 6 (six) hours as needed for moderate pain. 12/08/17   Dagoberto Ligas, PA-C  levothyroxine (SYNTHROID, LEVOTHROID) 125 MCG tablet Take 125 mcg by mouth daily before breakfast.     [provider]  loperamide (IMODIUM) 2 MG capsule Take 2 capsules (4 mg total) by mouth as needed for diarrhea or loose stools. 03/27/17   Caren Griffins, MD  midodrine (PROAMATINE) 5 MG tablet Take 5 mg by mouth See admin instructions. 5 mg PRIOR TO dialysis on Mon/Wed/Fri 04/30/17   [provider]  nitroGLYCERIN (NITROSTAT) 0.4 MG SL tablet Place 1 tablet (0.4 mg total) under the tongue every 5 (five) minutes as needed for chest pain. 08/19/16 03/31/26  Richardson Dopp T, PA-C  ondansetron (ZOFRAN ODT) 4 MG disintegrating tablet Take 1 tablet (4 mg total) by mouth every 8 (eight) hours as needed for nausea or vomiting. 03/29/18   Deno Etienne, DO  predniSONE (DELTASONE) 10 MG tablet Take 4m daily for 1days,Take 336mdaily for 1days,Take 2084maily for 1days,Take 61m69mily for 1days, then start 5 mg daily Patient taking differently: 5 mg. Take 40mg90mly for 1days,Take 30mg 103my for 1days,Take 20mg d61m for  1days,Take 61mg da46mfor 1days, then start 5 mg daily 04/02/18   Patel, PLavina Hammandium bicarbonate 650 MG tablet Take 2 tablets (1,300 mg total) by mouth 2 (two) times daily. Patient taking differently: Take 1,300 mg by mouth daily.  11/21/16   Nettey, Mariel AloeELEGY ELLIPTA 100-62.5-25 MCG/INH AEPB INHALE  1 PUFF BY MOUTH ONCE DAILY 04/19/18   Chesley Mires, MD    Family History Family History  Problem Relation Age of Onset  . Hypertension Father   . Heart disease Father   . Heart attack Father   . Diverticulitis Mother   . COPD Mother   . Hypertension Mother   . Heart disease Mother   . Heart attack Mother   . Heart attack Brother   . Colon cancer Brother   . Diabetes Brother   . Thyroid disease Brother     Social History Social History   Tobacco Use  . Smoking status: Former Smoker    Packs/day: 0.25    Years: 15.00    Pack years: 3.75    Types: Cigarettes    Last attempt to quit: 11/27/2001    Years since quitting: 16.5  . Smokeless tobacco: Never Used  Substance Use Topics  . Alcohol use: No  . Drug use: No     Allergies   Mercaptopurine and Remicade [infliximab]   Review of Systems Review of Systems  Constitutional: Negative for fever.  Respiratory: Positive for cough, shortness of breath and wheezing.   Cardiovascular: Positive for leg swelling (mild). Negative for chest pain.  Gastrointestinal: Positive for nausea. Negative for abdominal pain, constipation, diarrhea and vomiting.  Genitourinary: Negative for difficulty urinating (no dysuria).  Neurological: Negative for headaches.     Physical Exam Updated Vital Signs BP 118/80 (BP Location: Right Arm)   Pulse 96   Temp 98.3 F (36.8 C) (Oral)   Resp (!) 22   SpO2 100%   Physical Exam Vitals signs and nursing note reviewed.  Constitutional:      General: She is not in acute distress.    Appearance: She is well-developed. She is not diaphoretic.  HENT:     Head: Normocephalic and  atraumatic.  Eyes:     Conjunctiva/sclera: Conjunctivae normal.  Neck:     Musculoskeletal: Normal range of motion.  Cardiovascular:     Rate and Rhythm: Normal rate and regular rhythm.     Heart sounds: Normal heart sounds. No murmur. No friction rub. No gallop.   Pulmonary:     Effort: Pulmonary effort is normal. No respiratory distress.     Breath sounds: Normal breath sounds. No wheezing or rales.  Abdominal:     General: There is no distension.     Palpations: Abdomen is soft.     Tenderness: There is no abdominal tenderness. There is no guarding.  Musculoskeletal:        General: No tenderness.  Skin:    General: Skin is warm and dry.     Findings: No erythema or rash.  Neurological:     Mental Status: She is alert and oriented to person, place, and time.      ED Treatments / Results  Labs (all labs ordered are listed, but only abnormal results are displayed) Labs Reviewed  CBC WITH DIFFERENTIAL/PLATELET - Abnormal; Notable for the following components:      Result Value   RBC 3.41 (*)    Hemoglobin 10.5 (*)    HCT 35.7 (*)    MCV 104.7 (*)    MCHC 29.4 (*)    RDW 16.3 (*)    Platelets 106 (*)    All other components within normal limits  COMPREHENSIVE METABOLIC PANEL - Abnormal; Notable for the following components:   Potassium 3.1 (*)    Creatinine, Ser 6.51 (*)    Calcium 8.5 (*)  Total Protein 6.1 (*)    Albumin 2.7 (*)    GFR calc non Af Amer 6 (*)    GFR calc Af Amer 7 (*)    All other components within normal limits  BRAIN NATRIURETIC PEPTIDE - Abnormal; Notable for the following components:   B Natriuretic Peptide 186.4 (*)    All other components within normal limits  D-DIMER, QUANTITATIVE (NOT AT [facility]) - Abnormal; Notable for the following components:   D-Dimer, Quant 3.38 (*)    All other components within normal limits  I-STAT TROPONIN, ED    EKG EKG Interpretation  Date/Time:  Friday May 28 2018 12:59:58 EST Ventricular Rate:   95 PR Interval:    QRS Duration: 66 QT Interval:  375 QTC Calculation: 472 R Axis:   60 Text Interpretation:  Sinus rhythm Anteroseptal infarct, age indeterminate No significant change since last tracing Confirmed by Gareth Morgan [phone]) on 05/28/2018 2:27:08 PM   Radiology Dg Chest 2 View  Result Date: 05/28/2018 CLINICAL DATA:  Shortness of breath. EXAM: CHEST - 2 VIEW COMPARISON:  03/31/2018. FINDINGS: Mediastinum and hilar structures normal. Heart size normal. Diffuse mild bilateral from interstitial prominence. These findings are noted on multiple prior exams most likely related chronic interstitial lung disease. And active interstitial process including interstitial edema and/or pneumonitis could not be excluded. No pleural effusion or pneumothorax. No acute bony abnormality. IMPRESSION: Diffuse mild bilateral pulmonary interstitial prominence. These changes are most likely related chronic interstitial lung disease. And active interstitial process including interstitial edema and/or pneumonitis could not be excluded. Electronically Signed   By: Marcello Moores  Register   On: 05/28/2018 14:00    Procedures Procedures (including critical care time)  Medications Ordered in ED Medications  methylPREDNISolone sodium succinate (SOLU-MEDROL) 125 mg/2 mL injection 125 mg (125 mg Intravenous Given 05/28/18 1411)  ipratropium-albuterol (DUONEB) 0.5-2.5 (3) MG/3ML nebulizer solution 3 mL (3 mLs Nebulization Given 05/28/18 1541)  iopamidol (ISOVUE-370) 76 % injection (100 mLs Intravenous Contrast Given 05/28/18 1551)     Initial Impression / Assessment and Plan / ED Course  I have reviewed the triage vital signs and the nursing notes.  Pertinent labs & imaging results that were available during my care of the patient were reviewed by me and considered in my medical decision making (see chart for details).    73yo female with history of COPD on 2-3L O2 per , hypertension, ESRD on dialysis MWF,  Crohn's, presents with concern for shortness of breath over the last 2 to 3 days.  Differential diagnosis includes CHF, COPD exacerbation, fluid overload from missed dialysis, pneumonia, or pulmonary embolus.  Chest x-ray with diffuse interstitial prominence, felt most likely to be related to chronic disease, difficult to exclude interstitial edema.  Patient is afebrile, have low suspicion for pneumonia.    CT PE study pending.  Patient was given Solu-Medrol, and duo nebs for concern of possible COPD exacerbation.  At this time, suspect likely multifactorial etiology of her dyspnea, however await CT PE study for further evaluation.  She was hypoxic on 2 L on arrival to the emergency department, however after receiving steroid and breathing treatments, has improvement.  We will continue to monitor her oxygenation as we await further studies. Care signed out to Dr. Ronnald Nian with     Final Clinical Impressions(s) / ED Diagnoses   Final diagnoses:  COPD exacerbation ([place])  Dyspnea, unspecified type    ED Discharge Orders    None       Gareth Morgan,  MD 05/28/18 1600

## 2018-05-28 NOTE — ED Notes (Signed)
Natasha [name] (SR)Dinner Tray Ordered @ 1836-per Patty, RN-called by Levada Dy

## 2018-05-29 ENCOUNTER — Encounter (HOSPITAL_COMMUNITY): Payer: Self-pay

## 2018-05-29 ENCOUNTER — Other Ambulatory Visit: Payer: Self-pay

## 2018-05-29 ENCOUNTER — Inpatient Hospital Stay (HOSPITAL_COMMUNITY): Payer: Medicare Other

## 2018-05-29 DIAGNOSIS — I2699 Other pulmonary embolism without acute cor pulmonale: Secondary | ICD-10-CM

## 2018-05-29 LAB — CBC
HCT: 33.7 % — ABNORMAL LOW (ref 36.0–46.0)
Hemoglobin: 10.4 g/dL — ABNORMAL LOW (ref 12.0–15.0)
MCH: 31.6 pg (ref 26.0–34.0)
MCHC: 30.9 g/dL (ref 30.0–36.0)
MCV: 102.4 fL — ABNORMAL HIGH (ref 80.0–100.0)
Platelets: 106 10*3/uL — ABNORMAL LOW (ref 150–400)
RBC: 3.29 MIL/uL — ABNORMAL LOW (ref 3.87–5.11)
RDW: 16.2 % — ABNORMAL HIGH (ref 11.5–15.5)
WBC: 2.3 10*3/uL — ABNORMAL LOW (ref 4.0–10.5)
nRBC: 0 % (ref 0.0–0.2)

## 2018-05-29 LAB — HEPARIN LEVEL (UNFRACTIONATED)
Heparin Unfractionated: 0.55 IU/mL (ref 0.30–0.70)
Heparin Unfractionated: 0.57 IU/mL (ref 0.30–0.70)

## 2018-05-29 LAB — MRSA PCR SCREENING: MRSA by PCR: NEGATIVE

## 2018-05-29 MED ORDER — WARFARIN SODIUM 5 MG PO TABS
5.0000 mg | ORAL_TABLET | Freq: Once | ORAL | Status: AC
  Administered 2018-05-29: 5 mg via ORAL
  Filled 2018-05-29: qty 1

## 2018-05-29 MED ORDER — ENSURE ENLIVE PO LIQD: 237.0000 mL | Freq: Two times a day (BID) | ORAL | Status: DC

## 2018-05-29 MED ORDER — CHLORHEXIDINE GLUCONATE CLOTH 2 % EX PADS
6.0000 | MEDICATED_PAD | Freq: Every day | CUTANEOUS | Status: DC
  Administered 2018-05-31 – 2018-06-03 (×4): 6 via TOPICAL

## 2018-05-29 MED ORDER — PRO-STAT SUGAR FREE PO LIQD
30.0000 mL | Freq: Two times a day (BID) | ORAL | Status: DC
  Filled 2018-05-29 (×6): qty 30

## 2018-05-29 MED ORDER — LOPERAMIDE HCL 2 MG PO CAPS
4.0000 mg | ORAL_CAPSULE | Freq: Once | ORAL | Status: AC
  Administered 2018-05-29: 4 mg via ORAL
  Filled 2018-05-29: qty 2

## 2018-05-29 MED ORDER — CALCITRIOL 0.5 MCG PO CAPS
1.7500 ug | ORAL_CAPSULE | ORAL | Status: DC
  Administered 2018-05-31 – 2018-06-04 (×3): 1.75 ug via ORAL

## 2018-05-29 MED ORDER — WARFARIN - PHARMACIST DOSING INPATIENT
Freq: Every day | Status: DC
  Administered 2018-05-29 – 2018-06-02 (×4)
  Administered 2018-06-04: 1
  Administered 2018-06-05: 19:00:00

## 2018-05-29 NOTE — Consult Note (Signed)
[facility] Renal Consultation Note    Indication for Consultation:  Management of ESRD/hemodialysis; anemia, hypertension/volume and secondary hyperparathyroidism   HPI: Natasha [name] is a 73 y.o. 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    Last dialyzed Friday 1/3. Only completed 58 mins of treatment and left 1.9kg over dry weight. Developed worsening dyspnea and appeared to be in distress so EMS called.  Reports SOB/chest pressure worse than baseline that began Thursday night. Felt like "something wasn't right"  Admitted for treatment of PE. Hypoxic on arrival sats 86% on 2L [place]. CXR with diffuse mild pulmonary interstitial prominence Found to have PE in LUL on CT Angio. Heparin gtt started and LE Dopplers orders. She does report prior L leg DVT around 7 years ago  Labs: Na 139, K 3.1 CO2 25, BUN 19, Cr 6.51, WBC 2.3, Hgb 10.4, Troponin 0.01.   Seen and examined at bedside. SpO2 100% on 2L. Feels like breathing has improved since arrival. Had had breathing treatment which helped. Not quite back to baseline status.   Past Medical History:  Diagnosis Date  . Anal fistula   . Anemia in chronic kidney disease (CKD)   . Aortic insufficiency    Echo 3/18: mod conc LVH, EF 60-65, no RWMA, Gr 1 DD, mod AI, mild MR, normal RVSF, Trivial TR  . Arthritis   . Borderline hypertension   . Bulging disc    L3-L4  . Chronic diarrhea    due to crohn's  . CKD (chronic kidney disease), stage IV ([place])    MWF- [address]  . Crohn's disease ([place])    chronic ileitis  . Dyspnea    with exertion  . Emphysema/COPD ([place])   . GERD (gastroesophageal reflux disease)    denies  . History of blood transfusion   . History of glaucoma   . History of kidney stones   . History of pancreatitis    2008--  mercaptopurine  . History of small bowel obstruction     12-03-2010  due to crohn's ileitis  . Hypertension    no longer on medications  . Hypothyroidism, postsurgical    multinodule w/ hurthle cells  . Osteoporosis   . Perianal Crohn's disease ([place])   . Peripheral vascular disease ([place])    blood clot behind knee left leg  . Polyarthralgia   . Seizures ([place])    03/2017  . Wears partial dentures    upper   Past Surgical History:  Procedure Laterality Date  . ABDOMINAL HYSTERECTOMY  1990   and  Appendectomy  . AV FISTULA PLACEMENT Left 12/08/2017   Procedure: INSERTION OF ARTERIOVENOUS (AV) GRAFT WITH ARTEGRAFT TO LEFT UPPER ARM;  Surgeon: Conrad [place], MD;  Location: [facility];  Service: Vascular;  Laterality: Left;  . BASCILIC VEIN TRANSPOSITION Left 01/09/2017   Procedure: LEFT 1ST STAGE BRACHIAL VEIN TRANSPOSITION;  Surgeon: Conrad [place], MD;  Location: [place];  Service: Vascular;  Laterality: Left;  . BASCILIC VEIN TRANSPOSITION Left 06/16/2017   Procedure: Second Stage BASILIC VEIN TRANSPOSITION  LEFT ARM;  Surgeon: Conrad [place], MD;  Location: [facility];  Service: Vascular;  Laterality: Left;  . BIOPSY  03/09/2018   Procedure: BIOPSY;  Surgeon: Wilford Corner, MD;  Location: [facility];  Service: Endoscopy;;  . CHOLECYSTECTOMY    . COLON RESECTION  x3 --  1978,  [place] x2/   RIGHT COLECTOMY 1989  . COLONOSCOPY WITH PROPOFOL N/A 09/25/2014   Procedure: COLONOSCOPY WITH PROPOFOL;  Surgeon: Garlan Fair, MD;  [facility]: [facility];  Service: Endoscopy;  Laterality: N/A;  . COLONOSCOPY WITH PROPOFOL N/A 03/09/2018   Procedure: COLONOSCOPY WITH PROPOFOL;  Surgeon: Wilford Corner, MD;  [facility];  Service: Endoscopy;  Laterality: N/A;  . CYSTOSCOPY W/ URETERAL STENT PLACEMENT Right 11/19/2016   Procedure: CYSTOSCOPY WITH RIGHT RETROGRADE PYELOGRAM/ URETEROSCOPY WITH LASER AND RIGHT URETERAL STENT PLACEMENT;  Surgeon: Ardis Hughs, MD;  Location: [facility];  Service: Urology;  Laterality: Right;   . ESOPHAGOGASTRODUODENOSCOPY  03/04/2012   Procedure: ESOPHAGOGASTRODUODENOSCOPY (EGD);  Surgeon: Garlan Fair, MD;  [facility];  Service: Endoscopy;  Laterality: N/A;  . EXAMINATION UNDER ANESTHESIA N/A 09/12/2014   Procedure: EXAM UNDER ANESTHESIA;  Surgeon: Rolm Bookbinder, MD;  Location: [facility];  Service: General;  Laterality: N/A;  . EYE SURGERY     laser  . FLEXIBLE SIGMOIDOSCOPY  03/04/2012   Procedure: FLEXIBLE SIGMOIDOSCOPY;  Surgeon: Garlan Fair, MD;  [facility];  Service: Endoscopy;  Laterality: N/A;  . GLAUCOMA SURGERY Bilateral   . HOLMIUM LASER APPLICATION Right 10/30/3708   Procedure: HOLMIUM LASER APPLICATION;  Surgeon: Ardis Hughs, MD;  [facility]: [facility];  Service: Urology;  Laterality: Right;  . INCISION AND DRAINAGE PERIRECTAL ABSCESS N/A 09/12/2014   Procedure: IRRIGATION AND DEBRIDEMENT PERIRECTAL ABSCESS;  Surgeon: Rolm Bookbinder, MD;  Location: [facility];  Service: General;  Laterality: N/A;  . IR FLUORO GUIDE CV LINE RIGHT  03/20/2017  . IR US GUIDE VASC ACCESS RIGHT  03/20/2017  . NEGATIVE SLEEP STUDY  2008  . PLACEMENT OF SETON N/A 12/01/2014   Procedure: PLACEMENT OF SETON;  Surgeon: [name], MD;  Location: [facility];  Service: General;  Laterality: N/A;  . POLYPECTOMY  03/09/2018   Procedure: POLYPECTOMY;  Surgeon: Wilford Corner, MD;  [facility];  Service: Endoscopy;;  . RECTAL EXAM UNDER ANESTHESIA N/A 12/01/2014   Procedure: RECTAL EXAM UNDER ANESTHESIA;  Surgeon: [name], MD;  Location: [facility];  Service: General;  Laterality: N/A;  . TOTAL THYROIDECTOMY  10-13-2003  . TRANSTHORACIC ECHOCARDIOGRAM  07-11-2013   mild LVH,  ef 55%,  moderate AR,  mild MR and TR,  trivial PR  . TUBAL LIGATION     Family History  Problem Relation Age of Onset  . Hypertension Father   . Heart disease Father   . Heart attack Father   . Diverticulitis Mother   . COPD Mother   .  Hypertension Mother   . Heart disease Mother   . Heart attack Mother   . Heart attack Brother   . Colon cancer Brother   . Diabetes Brother   . Thyroid disease Brother    Social History:  reports that she quit smoking about 16 years ago. Her smoking use included cigarettes. She has a 3.75 pack-year smoking history. She has never used smokeless tobacco. She reports that she does not drink alcohol or use drugs. Allergies  Allergen Reactions  . Mercaptopurine Other (See Comments)    Caused pancreatitis  . Remicade [Infliximab] Other (See Comments)    CAUSED JOINT PAIN   Prior to Admission medications   Medication Sig Start Date End Date Taking? Authorizing Provider  acetaminophen (TYLENOL) 500 MG tablet Take 1,000 mg by mouth every 6 (six) hours as needed for mild pain.  Yes [provider]  Adalimumab (HUMIRA) 40 MG/0.8ML PSKT Inject 40 mg into the skin every 14 (fourteen) days.    Yes [provider]  albuterol (PROVENTIL HFA;VENTOLIN HFA) 108 (90 Base) MCG/ACT inhaler Inhale 2 puffs into the lungs every 6 (six) hours as needed for wheezing or shortness of breath. 06/09/17  Yes Chesley Mires, MD  alendronate (FOSAMAX) 70 MG tablet Take 70 mg by mouth every Monday.  11/06/16  Yes [provider]  calcitRIOL (ROCALTROL) 0.25 MCG capsule Take 0.25 mcg by mouth every Monday, Wednesday, and Friday with hemodialysis. 02/03/17  Yes [provider]  cyanocobalamin (,VITAMIN B-12,) 1000 MCG/ML injection Inject 1,000 mcg into the muscle every 30 (thirty) days.    Yes [provider]  HYDROcodone-acetaminophen (NORCO) 5-325 MG tablet Take 1 tablet by mouth every 6 (six) hours as needed for moderate pain. 12/08/17  Yes Dagoberto Ligas, PA-C  levothyroxine (SYNTHROID, LEVOTHROID) 125 MCG tablet Take 125 mcg by mouth daily before breakfast.    Yes [provider]  loperamide (IMODIUM) 2 MG capsule Take 2 capsules (4 mg total) by mouth as needed for  diarrhea or loose stools. Patient taking differently: Take 4 mg by mouth every Monday, Wednesday, and Friday.  03/27/17  Yes Caren Griffins, MD  midodrine (PROAMATINE) 5 MG tablet Take 5 mg by mouth every Monday, Wednesday, and Friday. 5 mg PRIOR TO dialysis on Mon/Wed/Fri 04/30/17  Yes [provider]  ondansetron (ZOFRAN ODT) 4 MG disintegrating tablet Take 1 tablet (4 mg total) by mouth every 8 (eight) hours as needed for nausea or vomiting. 03/29/18  Yes Deno Etienne, DO  OXYGEN Inhale 2-3 L into the lungs See admin instructions. Use every night and during the day as needed for shortness of breath   Yes [provider]  predniSONE (DELTASONE) 5 MG tablet Take 5 mg by mouth daily with breakfast.   Yes [provider]  sodium bicarbonate 650 MG tablet Take 2 tablets (1,300 mg total) by mouth 2 (two) times daily. Patient taking differently: Take 1,300 mg by mouth daily.  11/21/16  Yes Mariel Aloe, MD  TRELEGY ELLIPTA 100-62.5-25 MCG/INH AEPB INHALE 1 PUFF BY MOUTH ONCE DAILY Patient taking differently: Inhale 1 puff into the lungs daily.  04/19/18  Yes Chesley Mires, MD  dextromethorphan (DELSYM) 30 MG/5ML liquid Take 5 mLs (30 mg total) by mouth 2 (two) times daily as needed for cough. Patient not taking: Reported on 05/28/2018 04/02/18   Lavina Hamman, MD  guaiFENesin (MUCINEX) 600 MG 12 hr tablet Take 1 tablet (600 mg total) by mouth 2 (two) times daily. Patient not taking: Reported on 05/28/2018 04/02/18   Lavina Hamman, MD  nitroGLYCERIN (NITROSTAT) 0.4 MG SL tablet Place 1 tablet (0.4 mg total) under the tongue every 5 (five) minutes as needed for chest pain. Patient not taking: Reported on 05/28/2018 08/19/16 03/31/26  Richardson Dopp T, PA-C   Current Facility-Administered Medications  Medication Dose Route Frequency Provider Last Rate Last Dose  . 0.9 %  sodium chloride infusion  250 mL Intravenous PRN Merton Border, MD      . acetaminophen (TYLENOL) tablet 1,000 mg   1,000 mg Oral Q6H PRN Merton Border, MD      . albuterol (PROVENTIL) (2.5 MG/3ML) 0.083% nebulizer solution 2.5 mg  2.5 mg Nebulization Q2H PRN Merton Border, MD      . Derrill Memo ON 05/31/2018] calcitRIOL (ROCALTROL) capsule 0.25 mcg  0.25 mcg Oral Q M,W,F-HD Merton Border,  MD      . cyanocobalamin ((VITAMIN B-12)) injection 1,000 mcg  1,000 mcg Intramuscular Q30 days Merton Border, MD      . feeding supplement (ENSURE ENLIVE) (ENSURE ENLIVE) liquid 237 mL  237 mL Oral BID BM Merton Border, MD      . heparin ADULT infusion 100 units/mL (25000 units/231m sodium chloride 0.45%)  800 Units/hr Intravenous Continuous HMerton Border MD 8 mL/hr at 05/28/18 1729 800 Units/hr at 05/28/18 1729  . HYDROcodone-acetaminophen (NORCO/VICODIN) 5-325 MG per tablet 1 tablet  1 tablet Oral Q6H PRN HMerton Border MD   1 tablet at 05/28/18 2303  . ipratropium-albuterol (DUONEB) 0.5-2.5 (3) MG/3ML nebulizer solution 3 mL  3 mL Nebulization BID HMerton Border MD   3 mL at 05/29/18 0715  . levothyroxine (SYNTHROID, LEVOTHROID) tablet 125 mcg  125 mcg Oral QAC breakfast HMerton Border MD   125 mcg at 05/29/18 0905  . midodrine (PROAMATINE) tablet 5 mg  5 mg Oral Q M,W,F HMerton Border MD   5 mg at 05/29/18 0905  . ondansetron (ZOFRAN) injection 4 mg  4 mg Intravenous Q6H PRN HMerton Border MD      . ondansetron (ZOFRAN-ODT) disintegrating tablet 4 mg  4 mg Oral Q8H PRN HMerton Border MD      . predniSONE (DELTASONE) tablet 5 mg  5 mg Oral Q breakfast HMerton Border MD   5 mg at 05/29/18 0905  . sodium chloride flush (NS) 0.9 % injection 3 mL  3 mL Intravenous Q12H HMerton Border MD   3 mL at 05/29/18 0906  . sodium chloride flush (NS) 0.9 % injection 3 mL  3 mL Intravenous PRN HMerton Border MD      . warfarin (COUMADIN) tablet 5 mg  5 mg Oral ONCE-1800 PLavina Hamman MD      . Warfarin - Pharmacist Dosing Inpatient   Does not apply [id number] Hamman MD        ROS: As per HPI otherwise negative.  Physical Exam: Vitals:   05/29/18 0050 05/29/18  0414 05/29/18 0715 05/29/18 0816  BP:  108/67  125/68  Pulse:  87  88  Resp:  18  (!) 25  Temp:  98.3 F (36.8 C)  98 F (36.7 C)  TempSrc:  Oral  Oral  SpO2:  100% 100% 100%  Weight: 46.2 kg     Height:         General: Elderly female on nasal oxygen NAD  Head: NCAT sclera not icteric MMM Neck: Supple. No JVD Lungs: Decreased air movement but CTAB. No wheeze, no rales  Heart: RRR with S1 S2 Abdomen: soft NT + BS Lower extremities: No LE edema or wounds  Neuro: A & O  X 3. Moves all extremities spontaneously. Psych:  Responds to questions appropriately with a normal affect. Dialysis Access: LUE AVG +bruit   Labs: Basic Metabolic Panel: Recent Labs  Lab 05/28/18 1342  NA 139  K 3.1*  CL 105  CO2 25  GLUCOSE 79  BUN 19  CREATININE 6.51*  CALCIUM 8.5*   Liver Function Tests: Recent Labs  Lab 05/28/18 1342  AST 15  ALT 9  ALKPHOS 49  BILITOT 0.5  PROT 6.1*  ALBUMIN 2.7*   No results for input(s): LIPASE, AMYLASE in the last 168 hours. No results for input(s): AMMONIA in the last 168 hours. CBC: Recent Labs  Lab 05/28/18 1342 05/29/18 0051  WBC 4.1 2.3*  NEUTROABS 2.3  --   HGB 10.5*  10.4*  HCT 35.7* 33.7*  MCV 104.7* 102.4*  PLT 106* 106*   Cardiac Enzymes: No results for input(s): CKTOTAL, CKMB, CKMBINDEX, TROPONINI in the last 168 hours. CBG: No results for input(s): GLUCAP in the last 168 hours. Iron Studies: No results for input(s): IRON, TIBC, TRANSFERRIN, FERRITIN in the last 72 hours. Studies/Results:   Dialysis Orders:  [facility] MWF 4.25h 400/800 EDW 44kg 4K/2.25Ca UF Prof 4 L AVG Heparin 4200 Mircera 118mg IV q 2 weeks (last 12/24) Calcitriol 1.75 mcg PO TIW   Assessment/Plan: 1. LUL PE- Heparin gtt per primary. Hx of LLE DVT   2. ESRD - Usually MWF. Only 1 hour of HD yesterday. Short HD to make up missed treatment. K 3.1 use 4K bath  3. Hypertension/volume  - BP controlled. On midodrine TIW for BP support during HD. 2kg over EDW Short  HD today for volume removal  4. Anemia  - Hgb 10.4. On ESA as outpatient. Follow trends  5. Metabolic bone disease -  Ca ok. Continue VDRA. OP Phos controlled. No binders.  6. Nutrition - Renal diet/protein supp for low albumin  7. Hx COPD/Emphysema   OLynnda ChildPA-C CWest SamosetPager [phone]/08/2018, 3:03 PM

## 2018-05-29 NOTE — Progress Notes (Signed)
Initial Nutrition Assessment  DOCUMENTATION CODES:   Severe malnutrition in context of chronic illness, Underweight  INTERVENTION:  Provide 30 ml Prostat po BID, each supplement provides 100 kcal and 15 grams of protein.   Discontinue Ensure due to poor tolerance.   Encourage adequate PO intake.   NUTRITION DIAGNOSIS:   Severe Malnutrition related to chronic illness(ESRD HD) as evidenced by severe fat depletion, severe muscle depletion.  GOAL:   Patient will meet greater than or equal to 90% of their needs  MONITOR:   PO intake, Supplement acceptance, Labs, I & O's, Weight trends, Skin  REASON FOR ASSESSMENT:   Malnutrition Screening Tool    ASSESSMENT:   73 y.o. female, with past medical history significant for end-stage renal disease on hemodialysis, history of COPD and history of DVT 6 years ago status post treatment with Coumadin for few months after which it was stopped.  Patient is presenting with 2 days history of increasing shortness of breath. Work-up showed left upper lobe PE.  Meal completion has been 100%. Pt reports having a good appetite currently and PTA with usual consumption of at least 3 meals a day. Pt reports all meals are home cooked and consists of a protein, starch, and vegetable. Pt knowledgeable on the importance of increased protein and caloric needs especially on HD. Pt has previously tried Ensure, Boost, Nepro shakes, however they ave caused diarrhea. RD to order Prostat instead to aid in increased protein needs. Weight has been mostly stable.   Labs and medications reviewed.   NUTRITION - FOCUSED PHYSICAL EXAM:    Most Recent Value  Orbital Region  Unable to assess  Upper Arm Region  Moderate depletion  Thoracic and Lumbar Region  Severe depletion  Buccal Region  Moderate depletion  Temple Region  Unable to assess  Clavicle Bone Region  Severe depletion  Clavicle and Acromion Bone Region  Severe depletion  Scapular Bone Region  Unable to  assess  Dorsal Hand  Unable to assess  Patellar Region  Moderate depletion  Anterior Thigh Region  Moderate depletion  Posterior Calf Region  Moderate depletion  Edema (RD Assessment)  None  Hair  Reviewed  Eyes  Reviewed  Mouth  Reviewed  Skin  Reviewed  Nails  Reviewed       Diet Order:   Diet Order            Diet renal with fluid restriction Fluid restriction: 1200 mL Fluid; Room service appropriate? Yes; Fluid consistency: Thin  Diet effective now              EDUCATION NEEDS:   Education needs have been addressed  Skin:  Skin Assessment: Reviewed RN Assessment  Last BM:  1/4  Height:   Ht Readings from Last 1 Encounters:  05/29/18 5' 4"  (1.626 m)    Weight:   Wt Readings from Last 1 Encounters:  05/29/18 46.2 kg    Ideal Body Weight:  54.5 kg  BMI:  Body mass index is 17.47 kg/m.  Estimated Nutritional Needs:   Kcal:  1600-1800  Protein:  70-85 grams  Fluid:  1.2 L/day    Corrin Parker, MS, RD, LDN Pager # [phone] After hours/ weekend pager # [phone]

## 2018-05-29 NOTE — Progress Notes (Addendum)
[facility] for Heparin / Warfarin Indication: Pulmonary embolism  Allergies  Allergen Reactions  . Mercaptopurine Other (See Comments)    Caused pancreatitis  . Remicade [Infliximab] Other (See Comments)    CAUSED JOINT PAIN    Patient Measurements: Height: 5' 4"  (162.6 cm) Weight: 101 lb 12.8 oz (46.2 kg) IBW/kg (Calculated) : 54.7 Heparin Dosing Weight: 44.5 kg  (patient stated weight)  Vital Signs: Temp: 98 F (36.7 C) (01/04 0816) Temp Source: Oral (01/04 0816) BP: 125/68 (01/04 0816) Pulse Rate: 88 (01/04 0816)  Labs: Recent Labs    05/28/18 1342 05/29/18 0051 05/29/18 1029  HGB 10.5* 10.4*  --   HCT 35.7* 33.7*  --   PLT 106* 106*  --   HEPARINUNFRC  --  0.55 0.57  CREATININE 6.51*  --   --     Estimated Creatinine Clearance: 5.7 mL/min (A) (by C-G formula based on SCr of 6.51 mg/dL (H)).   Assessment: 73 yo female with h/o COPD, ESRD on HD admitted with complaints of SOA. CT evidence of PE found without heart strain. Pharmacy consulted to start heparin drip. No anticoagulation noted PTA.  Heparin level therapeutic x 2   Goal of Therapy:  Heparin level 0.3-0.7 units/ml Monitor platelets by anticoagulation protocol: Yes  INR = 2 to 3   Plan:  Cont heparin at 800 units/hr Warfarin 5 mg po x 1 today Daily heparin level, CBC, INR  Thank you Anette Guarneri, PharmD [phone]

## 2018-05-29 NOTE — Progress Notes (Signed)
LE venous duplex       has been completed. Preliminary results can be found under CV proc through chart review. Bethzy Hauck, BS, RDMS, RVT   

## 2018-05-29 NOTE — Progress Notes (Signed)
HD orders for today have been rescheduled for 05/30/2018 by Dr Maryjane Hurter.  Notified charge nurse Selinda Eon at [phone].

## 2018-05-29 NOTE — Progress Notes (Signed)
[facility] Progress Note  Patient: Natasha [name] CHE:[id number]   PCP: [name], MD DOB: [birth year]   DOA: 05/28/2018   DOS: 05/29/2018   Date of Service: the patient was seen and examined on 05/29/2018  Brief hospital course: Pt. with PMH of  Crohn's disease, ESRD on HD, hypothyroidism, COPD; admitted on 05/28/2018, presented with complaint of shortness of breath, was found to have acute pulmonary embolism. Currently further plan is continue anticoagulation.  Subjective: Feeling better, continues to have shortness of breath but no nausea no vomiting.  No fever no chills no dizziness reported.  No acute events overnight.  No blood in the stool.  Assessment and Plan: 1.   Acute pulmonary embolism. Chronic left leg DVT - Presents with progressive SOB and cough, Unable to complete HD treatment secondary to shortness of breath. Underwent CT scan of the chest which is positive for left segmental single pulmonary embolism. Lower extremity Doppler is also positive for chronic DVT in the left leg. Patient has known history of prior clots in the left leg. Started on IV heparin, was on Coumadin in the past without any complication. Due to her ESRD Coumadin will be the only outpatient treatment available. Transition to Coumadin and heparin combination will need to be bridged in the hospital.  2. Hypokalemia  - Will be corrected at HD.  Continue to monitor.  3. ESRD  - Typically on MWF schedule for HD  - was dialyzed (1/4)  but unable to complete full cycle.  Nephrology consult for further assistance.  - acute session HD today.  4.   COPD, chronic respiratory failure on 2 LPM [place] -No acute exacerbation.  Continue home regimen.  On 2 L of oxygen.   5. Hypothyroidism  - Continue Synthroid   6. Crohn's colitis  - She is followed by GI, managed with Humira and daily low-dose prednisone, currently monitor.  7.  Underweight  Body mass index is 17.47 kg/m.  Nutrition  Problem: Severe Malnutrition Etiology: chronic illness(ESRD HD) Interventions: Interventions: Prostat  Diet: renal diet  Advance goals of care discussion: full code  Family Communication: no family was present at bedside, at the time of interview.  Disposition:  Discharge to home in 4-5 days once INR is therapeutic.  Consultants: nephrology Procedures: none  Scheduled Meds: . [START ON 05/31/2018] calcitRIOL  1.75 mcg Oral Q M,W,F-HD  . [START ON 05/30/2018] Chlorhexidine Gluconate Cloth  6 each Topical Q0600  . cyanocobalamin  1,000 mcg Intramuscular Q30 days  . feeding supplement (PRO-STAT SUGAR FREE 64)  30 mL Oral BID  . ipratropium-albuterol  3 mL Nebulization BID  . levothyroxine  125 mcg Oral QAC breakfast  . midodrine  5 mg Oral Q M,W,F  . predniSONE  5 mg Oral Q breakfast  . sodium chloride flush  3 mL Intravenous Q12H  . Warfarin - Pharmacist Dosing Inpatient   Does not apply q1800   Continuous Infusions: . sodium chloride    . heparin 800 Units/hr (05/28/18 1729)   PRN Meds: sodium chloride, acetaminophen, albuterol, HYDROcodone-acetaminophen, [DISCONTINUED] ondansetron **OR** ondansetron (ZOFRAN) IV, ondansetron, sodium chloride flush Antibiotics: Anti-infectives (From admission, onward)   None       Objective: Physical Exam: Vitals:   05/29/18 0414 05/29/18 0715 05/29/18 0816 05/29/18 1523  BP: 108/67  125/68 109/63  Pulse: 87  88 86  Resp: 18  (!) 25 20  Temp: 98.3 F (36.8 C)  98 F (36.7 C) 97.8 F (36.6 C)  TempSrc: Oral  Oral   SpO2: 100% 100% 100% 100%  Weight:      Height:        Intake/Output Summary (Last 24 hours) at 05/29/2018 1718 Last data filed at 05/29/2018 6578 Gross per 24 hour  Intake 372.23 ml  Output -  Net 372.23 ml   Filed Weights   05/29/18 0050  Weight: 46.2 kg   General: Alert, Awake and Oriented to Time, Place and Person. Appear in mild distress, affect appropriate Eyes: PERRL, Conjunctiva normal ENT: Oral Mucosa  clear moist. Neck: no JVD, no Abnormal Mass Or lumps Cardiovascular: S1 and S2 Present, no Murmur, Peripheral Pulses Present Respiratory: normal respiratory effort, Bilateral Air entry equal and Decreased, no use of accessory muscle, Clear to Auscultation, no Crackles, no wheezes Abdomen: Bowel Sound present, Soft and no tenderness, no hernia Skin: no redness, no Rash, no induration Extremities: bilateral trace Pedal edema, no calf tenderness Neurologic: Grossly no focal neuro deficit. Bilaterally Equal motor strength  Data Reviewed: CBC: Recent Labs  Lab 05/28/18 1342 05/29/18 0051  WBC 4.1 2.3*  NEUTROABS 2.3  --   HGB 10.5* 10.4*  HCT 35.7* 33.7*  MCV 104.7* 102.4*  PLT 106* 469*   Basic Metabolic Panel: Recent Labs  Lab 05/28/18 1342  NA 139  K 3.1*  CL 105  CO2 25  GLUCOSE 79  BUN 19  CREATININE 6.51*  CALCIUM 8.5*    Liver Function Tests: Recent Labs  Lab 05/28/18 1342  AST 15  ALT 9  ALKPHOS 49  BILITOT 0.5  PROT 6.1*  ALBUMIN 2.7*   No results for input(s): LIPASE, AMYLASE in the last 168 hours. No results for input(s): AMMONIA in the last 168 hours. Coagulation Profile: No results for input(s): INR, PROTIME in the last 168 hours. Cardiac Enzymes: No results for input(s): CKTOTAL, CKMB, CKMBINDEX, TROPONINI in the last 168 hours. BNP (last 3 results) No results for input(s): PROBNP in the last 8760 hours. CBG: No results for input(s): GLUCAP in the last 168 hours. Studies: Vas [place] Lower Extremity Venous (dvt)  Result Date: 05/29/2018  Lower Venous Study Risk Factors: Confirmed PE DVT Hx. Performing Technologist: June Leap RDMS, RVT  Examination Guidelines: A complete evaluation includes B-mode imaging, spectral Doppler, color Doppler, and power Doppler as needed of all accessible portions of each vessel. Bilateral testing is considered an integral part of a complete examination. Limited examinations for reoccurring indications may be performed as  noted.  Right Venous Findings: +---------+---------------+---------+-----------+----------+-------+          CompressibilityPhasicitySpontaneityPropertiesSummary +---------+---------------+---------+-----------+----------+-------+ CFV      Full           Yes      Yes                          +---------+---------------+---------+-----------+----------+-------+ SFJ      Full                                                 +---------+---------------+---------+-----------+----------+-------+ FV Prox  Full                                                 +---------+---------------+---------+-----------+----------+-------+ FV Mid   Full                                                 +---------+---------------+---------+-----------+----------+-------+  FV DistalFull                                                 +---------+---------------+---------+-----------+----------+-------+ PFV      Full                                                 +---------+---------------+---------+-----------+----------+-------+ POP      Full           Yes      Yes                          +---------+---------------+---------+-----------+----------+-------+ PTV      Full                                                 +---------+---------------+---------+-----------+----------+-------+ PERO     Full                                                 +---------+---------------+---------+-----------+----------+-------+  Left Venous Findings: +---------+---------------+---------+-----------+----------+-------+          CompressibilityPhasicitySpontaneityPropertiesSummary +---------+---------------+---------+-----------+----------+-------+ CFV      Full           Yes      Yes                          +---------+---------------+---------+-----------+----------+-------+ SFJ      Full                                                  +---------+---------------+---------+-----------+----------+-------+ FV Prox  Full                                                 +---------+---------------+---------+-----------+----------+-------+ FV Mid   Full                                                 +---------+---------------+---------+-----------+----------+-------+ FV DistalFull                                                 +---------+---------------+---------+-----------+----------+-------+ PFV      Full                                                 +---------+---------------+---------+-----------+----------+-------+  POP      Partial        Yes      Yes                  Chronic +---------+---------------+---------+-----------+----------+-------+ PTV      Full                                                 +---------+---------------+---------+-----------+----------+-------+ PERO     Full                                                 +---------+---------------+---------+-----------+----------+-------+    Summary: Right: There is no evidence of deep vein thrombosis in the lower extremity. No cystic structure found in the popliteal fossa. 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 No cystic structure found in the popliteal fossa.  *See table(s) above for measurements and observations. Electronically signed by Ruta Hinds MD on 05/29/2018 at 3:18:09 PM.    Final      Time spent: 35 minutes  Author: Berle Mull, MD [facility] Pager: [phone] 05/29/2018 5:18 PM  Between 7PM-7AM, please contact night-coverage at [email or web address], password [email or web address]

## 2018-05-29 NOTE — Progress Notes (Signed)
[place] for Heparin Indication: Pulmonary embolism  Allergies  Allergen Reactions  . Mercaptopurine Other (See Comments)    Caused pancreatitis  . Remicade [Infliximab] Other (See Comments)    CAUSED JOINT PAIN    Patient Measurements: Height: 5' 4"  (162.6 cm) Weight: 101 lb 12.8 oz (46.2 kg) IBW/kg (Calculated) : 54.7 Heparin Dosing Weight: 44.5 kg  (patient stated weight)  Vital Signs: BP: 148/63 (01/03 1800) Pulse Rate: 104 (01/03 2015)  Labs: Recent Labs    05/28/18 1342 05/29/18 0051  HGB 10.5* 10.4*  HCT 35.7* 33.7*  PLT 106* 106*  HEPARINUNFRC  --  0.55  CREATININE 6.51*  --     Estimated Creatinine Clearance: 5.7 mL/min (A) (by C-G formula based on SCr of 6.51 mg/dL (H)).   Medical History: Past Medical History:  Diagnosis Date  . Anal fistula   . Anemia in chronic kidney disease (CKD)   . Aortic insufficiency    Echo 3/18: mod conc LVH, EF 60-65, no RWMA, Gr 1 DD, mod AI, mild MR, normal RVSF, Trivial TR  . Arthritis   . Borderline hypertension   . Bulging disc    L3-L4  . Chronic diarrhea    due to crohn's  . CKD (chronic kidney disease), stage IV ([place])    MWF- [address]  . Crohn's disease ([place])    chronic ileitis  . Dyspnea    with exertion  . Emphysema/COPD ([place])   . GERD (gastroesophageal reflux disease)    denies  . History of blood transfusion   . History of glaucoma   . History of kidney stones   . History of pancreatitis    2008--  mercaptopurine  . History of small bowel obstruction    12-03-2010  due to crohn's ileitis  . Hypertension    no longer on medications  . Hypothyroidism, postsurgical    multinodule w/ hurthle cells  . Osteoporosis   . Perianal Crohn's disease ([place])   . Peripheral vascular disease ([place])    blood clot behind knee left leg  . Polyarthralgia   . Seizures ([place])    03/2017  . Wears partial dentures    upper   Assessment: 73 yo female with h/o COPD, ESRD  on HD admitted with complaints of SOA. CT evidence of PE found without heart strain. Pharmacy consulted to start heparin drip. No anticoagulation noted PTA  Initial heparin level this AM is therapeutic, CBC stable  Goal of Therapy:  Heparin level 0.3-0.7 units/ml Monitor platelets by anticoagulation protocol: Yes   Plan:  Cont heparin at 800 units/hr Confirmatory heparin level at [place], PharmD, [place] Pharmacist Phone: [phone]

## 2018-05-30 LAB — CBC
HCT: 32.6 % — ABNORMAL LOW (ref 36.0–46.0)
HEMOGLOBIN: 9.8 g/dL — AB (ref 12.0–15.0)
MCH: 31.3 pg (ref 26.0–34.0)
MCHC: 30.1 g/dL (ref 30.0–36.0)
MCV: 104.2 fL — ABNORMAL HIGH (ref 80.0–100.0)
Platelets: 127 10*3/uL — ABNORMAL LOW (ref 150–400)
RBC: 3.13 MIL/uL — ABNORMAL LOW (ref 3.87–5.11)
RDW: 16.3 % — ABNORMAL HIGH (ref 11.5–15.5)
WBC: 6 10*3/uL (ref 4.0–10.5)
nRBC: 0 % (ref 0.0–0.2)

## 2018-05-30 LAB — PROTIME-INR
INR: 1.09
Prothrombin Time: 14 seconds (ref 11.4–15.2)

## 2018-05-30 LAB — HEPARIN LEVEL (UNFRACTIONATED): Heparin Unfractionated: 0.43 IU/mL (ref 0.30–0.70)

## 2018-05-30 MED ORDER — WARFARIN SODIUM 5 MG PO TABS
5.0000 mg | ORAL_TABLET | Freq: Once | ORAL | Status: AC
  Administered 2018-05-30: 5 mg via ORAL
  Filled 2018-05-30: qty 1

## 2018-05-30 MED ORDER — COUMADIN BOOK
Freq: Once | Status: AC
  Administered 2018-05-30: 1
  Filled 2018-05-30: qty 1

## 2018-05-30 NOTE — Discharge Instructions (Signed)

## 2018-05-30 NOTE — Progress Notes (Signed)
[place] for Heparin / Warfarin Indication: Pulmonary embolism  Allergies  Allergen Reactions  . Mercaptopurine Other (See Comments)    Caused pancreatitis  . Remicade [Infliximab] Other (See Comments)    CAUSED JOINT PAIN    Patient Measurements: Height: 5' 4"  (162.6 cm) Weight: 101 lb 12.8 oz (46.2 kg) IBW/kg (Calculated) : 54.7 Heparin Dosing Weight: 44.5 kg  (patient stated weight)  Vital Signs: Temp: 98.2 F (36.8 C) (01/05 0751) Temp Source: Oral (01/05 0751) BP: 118/61 (01/05 0751) Pulse Rate: 80 (01/05 0751)  Labs: Recent Labs    05/28/18 1342 05/29/18 0051 05/29/18 1029 05/30/18 0237  HGB 10.5* 10.4*  --  9.8*  HCT 35.7* 33.7*  --  32.6*  PLT 106* 106*  --  127*  LABPROT  --   --   --  14.0  INR  --   --   --  1.09  HEPARINUNFRC  --  0.55 0.57 0.43  CREATININE 6.51*  --   --   --     Estimated Creatinine Clearance: 5.7 mL/min (A) (by C-G formula based on SCr of 6.51 mg/dL (H)).   Assessment: 73 yo female with h/o COPD, ESRD on HD admitted with complaints of SOA. CT evidence of PE found without heart strain.  Day 2 of Warfarin / Heparin  INR today = 1.09 Heparin level therapeutic CBC stable  Goal of Therapy:  Heparin level 0.3-0.7 units/ml Monitor platelets by anticoagulation protocol: Yes  INR = 2 to 3   Plan:  Cont heparin at 800 units/hr Repeat Warfarin 5 mg po x 1 today Daily heparin level, CBC, INR  Thank you Anette Guarneri, PharmD [phone]

## 2018-05-30 NOTE — Progress Notes (Signed)
[facility] Progress Note   Subjective:  Seen in room. On heparin gtt. Did get SOB when up walking. Improved at rest. On 2L [place] which is her baseline status  Did not have HD last night d/t high volume of HD patients   Objective Vitals:   05/29/18 1948 05/29/18 2323 05/30/18 0716 05/30/18 0751  BP:  112/61  118/61  Pulse:  81 82 80  Resp:  16 16 18   Temp:  98.4 F (36.9 C)  98.2 F (36.8 C)  TempSrc:  Oral  Oral  SpO2: 98% 96% 94% 100%  Weight:      Height:       Physical Exam General: Elderly female on nasal O2 NAD, plesant Heart: RRR  Lungs: Decreased air movement bilat. No wheeze, rhonchi Abdomen: soft NT  Extremities: No LE edema  Dialysis Access: LUE AVG +bruit    Dialysis Orders:  [facility] MWF 4.25h 400/800 EDW 44kg 4K/2.25Ca UF Prof 4 L AVG Heparin 4200 Mircera 161mg IV q 2 weeks (last 12/24) Calcitriol 1.75 mcg PO TIW   Assessment/Plan: 1. LUL PE- Heparin/warfarin per primary. Hx of LLE DVT   2. ESRD - Usually MWF. Only 1 hour of HD Friday. Did not have HD last night d/t high volume of HD patients. Not in resp. distress. No urgent indications for HD today. Will resume Monday.  K 3.1 use 4K bath  3. Hypertension/volume  - BP controlled. On midodrine TIW for BP support during HD. 2kg over EDW. UF to EDW as tolerated  4. Anemia  - Hgb 10.4. On ESA as outpatient. Follow trends  5. Metabolic bone disease -  Ca ok. Continue VDRA. OP Phos controlled. No binders.  6. Nutrition - Renal diet/protein supp for low albumin  7. Crohn's disease/chronic diarrhea  8. Hx COPD/Emphysema   OLynnda ChildPA-C [facility] Pager [phone]/09/2018,1:35 PM  LOS: 2 days   Additional Objective Labs: Basic Metabolic Panel: Recent Labs  Lab 05/28/18 1342  NA 139  K 3.1*  CL 105  CO2 25  GLUCOSE 79  BUN 19  CREATININE 6.51*  CALCIUM 8.5*   CBC: Recent Labs  Lab 05/28/18 1342 05/29/18 0051 05/30/18 0237  WBC 4.1 2.3* 6.0   NEUTROABS 2.3  --   --   HGB 10.5* 10.4* 9.8*  HCT 35.7* 33.7* 32.6*  MCV 104.7* 102.4* 104.2*  PLT 106* 106* 127*   Blood Culture    Component Value Date/Time   SDES URINE, CLEAN CATCH 03/19/2017 1934   SPECREQUEST NONE 03/19/2017 1934   CULT (A) 03/19/2017 1934    40,000 COLONIES/mL LACTOBACILLUS SPECIES Standardized susceptibility testing for this organism is not available.    REPTSTATUS 03/21/2017 FINAL 03/19/2017 1934    Cardiac Enzymes: No results for input(s): CKTOTAL, CKMB, CKMBINDEX, TROPONINI in the last 168 hours. CBG: No results for input(s): GLUCAP in the last 168 hours. Iron Studies: No results for input(s): IRON, TIBC, TRANSFERRIN, FERRITIN in the last 72 hours. Lab Results  Component Value Date   INR 1.09 05/30/2018   INR 1.16 03/20/2017   INR 1.25 09/13/2014   Medications: . sodium chloride    . heparin 800 Units/hr (05/29/18 1722)   . [START ON 05/31/2018] calcitRIOL  1.75 mcg Oral Q M,W,F-HD  . Chlorhexidine Gluconate Cloth  6 each Topical Q0600  . cyanocobalamin  1,000 mcg Intramuscular Q30 days  . feeding supplement (PRO-STAT SUGAR FREE 64)  30 mL Oral BID  . levothyroxine  125 mcg Oral  QAC breakfast  . midodrine  5 mg Oral Q M,W,F  . predniSONE  5 mg Oral Q breakfast  . sodium chloride flush  3 mL Intravenous Q12H  . warfarin  5 mg Oral ONCE-1800  . Warfarin - Pharmacist Dosing Inpatient   Does not apply [phone]

## 2018-05-30 NOTE — Progress Notes (Signed)
[facility] Progress Note  Patient: Natasha [name] AVW:[id number]   PCP: [name], MD DOB: 1946/04/05   DOA: 05/28/2018   DOS: 05/30/2018   Date of Service: the patient was seen and examined on 05/30/2018  Brief hospital course: Pt. with PMH of  Crohn's disease, ESRD on HD, hypothyroidism, COPD; admitted on 05/28/2018, presented with complaint of shortness of breath, was found to have acute pulmonary embolism. Currently further plan is continue anticoagulation.  Subjective: No acute complaint no nausea no vomiting no fever no chills.  Assessment and Plan: 1.   Acute pulmonary embolism. Chronic left leg DVT - Presents with progressive SOB and cough, Unable to complete HD treatment secondary to shortness of breath. Underwent CT scan of the chest which is positive for left segmental single pulmonary embolism. Lower extremity Doppler is also positive for chronic DVT in the left leg. Patient has known history of prior clots in the left leg. Started on IV heparin, was on Coumadin in the past without any complication. Due to her ESRD Coumadin will be the only outpatient treatment available. Transition to Coumadin and heparin combination will need to be bridged in the hospital.  2. Hypokalemia  - Will be corrected at HD.  Continue to monitor.  3. ESRD  - Typically on MWF schedule for HD  - Appreciate nephrology assistance for hemodialysis.  4.   COPD, chronic respiratory failure on 2 LPM [place] -No acute exacerbation.  Continue home regimen.  On 2 L of oxygen.   5. Hypothyroidism  - Continue Synthroid   6. Crohn's colitis  - She is followed by GI, managed with Humira and daily low-dose prednisone, currently monitor.  7.  Underweight  Body mass index is 17.47 kg/m.  Nutrition Problem: Severe Malnutrition Etiology: chronic illness(ESRD HD) Interventions: Interventions: Prostat  Diet: renal diet  Advance goals of care discussion: full code  Family  Communication: no family was present at bedside, at the time of interview.  Disposition:  Discharge to home in 4-5 days once INR is therapeutic.  Consultants: nephrology Procedures: none  Scheduled Meds: . [START ON 05/31/2018] calcitRIOL  1.75 mcg Oral Q M,W,F-HD  . Chlorhexidine Gluconate Cloth  6 each Topical Q0600  . cyanocobalamin  1,000 mcg Intramuscular Q30 days  . feeding supplement (PRO-STAT SUGAR FREE 64)  30 mL Oral BID  . levothyroxine  125 mcg Oral QAC breakfast  . midodrine  5 mg Oral Q M,W,F  . predniSONE  5 mg Oral Q breakfast  . sodium chloride flush  3 mL Intravenous Q12H  . Warfarin - Pharmacist Dosing Inpatient   Does not apply q1800   Continuous Infusions: . sodium chloride    . heparin 800 Units/hr (05/29/18 1722)   PRN Meds: sodium chloride, acetaminophen, albuterol, HYDROcodone-acetaminophen, [DISCONTINUED] ondansetron **OR** ondansetron (ZOFRAN) IV, ondansetron, sodium chloride flush Antibiotics: Anti-infectives (From admission, onward)   None       Objective: Physical Exam: Vitals:   05/29/18 2323 05/30/18 0716 05/30/18 0751 05/30/18 1638  BP: 112/61  118/61 116/69  Pulse: 81 82 80 81  Resp: 16 16 18 18   Temp: 98.4 F (36.9 C)  98.2 F (36.8 C) 98 F (36.7 C)  TempSrc: Oral  Oral Oral  SpO2: 96% 94% 100%   Weight:      Height:        Intake/Output Summary (Last 24 hours) at 05/30/2018 1923 Last data filed at 05/30/2018 0453 Gross per 24 hour  Intake -  Output 2 ml  Net -  2 ml   Filed Weights   05/29/18 0050  Weight: 46.2 kg   General: Alert, Awake and Oriented to Time, Place and Person. Appear in mild distress, affect appropriate Eyes: PERRL, Conjunctiva normal ENT: Oral Mucosa clear moist. Neck: no JVD, no Abnormal Mass Or lumps Cardiovascular: S1 and S2 Present, no Murmur, Peripheral Pulses Present Respiratory: normal respiratory effort, Bilateral Air entry equal and Decreased, no use of accessory muscle, Clear to Auscultation, no  Crackles, no wheezes Abdomen: Bowel Sound present, Soft and no tenderness, no hernia Skin: no redness, no Rash, no induration Extremities: bilateral trace Pedal edema, no calf tenderness Neurologic: Grossly no focal neuro deficit. Bilaterally Equal motor strength  Data Reviewed: CBC: Recent Labs  Lab 05/28/18 1342 05/29/18 0051 05/30/18 0237  WBC 4.1 2.3* 6.0  NEUTROABS 2.3  --   --   HGB 10.5* 10.4* 9.8*  HCT 35.7* 33.7* 32.6*  MCV 104.7* 102.4* 104.2*  PLT 106* 106* 785*   Basic Metabolic Panel: Recent Labs  Lab 05/28/18 1342  NA 139  K 3.1*  CL 105  CO2 25  GLUCOSE 79  BUN 19  CREATININE 6.51*  CALCIUM 8.5*    Liver Function Tests: Recent Labs  Lab 05/28/18 1342  AST 15  ALT 9  ALKPHOS 49  BILITOT 0.5  PROT 6.1*  ALBUMIN 2.7*   No results for input(s): LIPASE, AMYLASE in the last 168 hours. No results for input(s): AMMONIA in the last 168 hours. Coagulation Profile: Recent Labs  Lab 05/30/18 0237  INR 1.09   Cardiac Enzymes: No results for input(s): CKTOTAL, CKMB, CKMBINDEX, TROPONINI in the last 168 hours. BNP (last 3 results) No results for input(s): PROBNP in the last 8760 hours. CBG: No results for input(s): GLUCAP in the last 168 hours. Studies: No results found.   Time spent: 35 minutes  Author: Berle Mull, MD [facility] Pager: [phone] 05/30/2018 7:23 PM  Between 7PM-7AM, please contact night-coverage at [email or web address], password [facility]

## 2018-05-31 LAB — CBC
HCT: 33.6 % — ABNORMAL LOW (ref 36.0–46.0)
Hemoglobin: 10.2 g/dL — ABNORMAL LOW (ref 12.0–15.0)
MCH: 31.5 pg (ref 26.0–34.0)
MCHC: 30.4 g/dL (ref 30.0–36.0)
MCV: 103.7 fL — ABNORMAL HIGH (ref 80.0–100.0)
Platelets: 136 10*3/uL — ABNORMAL LOW (ref 150–400)
RBC: 3.24 MIL/uL — ABNORMAL LOW (ref 3.87–5.11)
RDW: 16.1 % — ABNORMAL HIGH (ref 11.5–15.5)
WBC: 4.7 10*3/uL (ref 4.0–10.5)
nRBC: 0 % (ref 0.0–0.2)

## 2018-05-31 LAB — RENAL FUNCTION PANEL
Albumin: 2.4 g/dL — ABNORMAL LOW (ref 3.5–5.0)
Anion gap: 15 (ref 5–15)
BUN: 70 mg/dL — ABNORMAL HIGH (ref 8–23)
CO2: 13 mmol/L — ABNORMAL LOW (ref 22–32)
Calcium: 8.2 mg/dL — ABNORMAL LOW (ref 8.9–10.3)
Chloride: 108 mmol/L (ref 98–111)
Creatinine, Ser: 10.44 mg/dL — ABNORMAL HIGH (ref 0.44–1.00)
GFR calc Af Amer: 4 mL/min — ABNORMAL LOW (ref >60)
GFR calc non Af Amer: 3 mL/min — ABNORMAL LOW (ref >60)
Glucose, Bld: 52 mg/dL — ABNORMAL LOW (ref 70–99)
Phosphorus: 7.9 mg/dL — ABNORMAL HIGH (ref 2.5–4.6)
Potassium: 4.1 mmol/L (ref 3.5–5.1)
Sodium: 136 mmol/L (ref 135–145)

## 2018-05-31 LAB — PROTIME-INR
INR: 1.12
Prothrombin Time: 14.3 seconds (ref 11.4–15.2)

## 2018-05-31 LAB — HEPARIN LEVEL (UNFRACTIONATED): Heparin Unfractionated: 0.48 IU/mL (ref 0.30–0.70)

## 2018-05-31 MED ORDER — MIDODRINE HCL 5 MG PO TABS
ORAL_TABLET | ORAL | Status: AC
  Administered 2018-05-31: 5 mg via ORAL
  Filled 2018-05-31: qty 1

## 2018-05-31 MED ORDER — WARFARIN SODIUM 5 MG PO TABS
5.0000 mg | ORAL_TABLET | Freq: Once | ORAL | Status: AC
  Administered 2018-05-31: 5 mg via ORAL
  Filled 2018-05-31 (×2): qty 1

## 2018-05-31 MED ORDER — HYDROMORPHONE HCL 1 MG/ML IJ SOLN: 1.0000 mg | INTRAMUSCULAR | Status: AC | PRN

## 2018-05-31 MED ORDER — SODIUM CHLORIDE 0.9 % IV SOLN: 100.0000 mL | INTRAVENOUS | Status: DC | PRN

## 2018-05-31 MED ORDER — ALBUTEROL SULFATE (2.5 MG/3ML) 0.083% IN NEBU
INHALATION_SOLUTION | RESPIRATORY_TRACT | Status: AC
  Administered 2018-05-31: 2.5 mg
  Filled 2018-05-31: qty 3

## 2018-05-31 MED ORDER — MIDODRINE HCL 5 MG PO TABS
10.0000 mg | ORAL_TABLET | ORAL | Status: DC
  Administered 2018-06-02: 10 mg via ORAL

## 2018-05-31 MED ORDER — CALCITRIOL 0.5 MCG PO CAPS
ORAL_CAPSULE | ORAL | Status: AC
  Filled 2018-05-31: qty 3

## 2018-05-31 MED ORDER — ALBUMIN HUMAN 25 % IV SOLN
25.0000 g | Freq: Once | INTRAVENOUS | Status: AC
  Administered 2018-05-31: 25 g via INTRAVENOUS

## 2018-05-31 MED ORDER — HYDROCODONE-ACETAMINOPHEN 5-325 MG PO TABS
ORAL_TABLET | ORAL | Status: AC
  Filled 2018-05-31: qty 1

## 2018-05-31 MED ORDER — ALBUMIN HUMAN 25 % IV SOLN
INTRAVENOUS | Status: AC
  Administered 2018-05-31: 25 g via INTRAVENOUS
  Filled 2018-05-31: qty 100

## 2018-05-31 MED ORDER — CALCITRIOL 0.25 MCG PO CAPS
ORAL_CAPSULE | ORAL | Status: AC
  Filled 2018-05-31: qty 1

## 2018-05-31 NOTE — Progress Notes (Signed)
[place] for Heparin / Warfarin Indication: Pulmonary embolism  Allergies  Allergen Reactions  . Mercaptopurine Other (See Comments)    Caused pancreatitis  . Remicade [Infliximab] Other (See Comments)    CAUSED JOINT PAIN    Patient Measurements: Height: 5' 4"  (162.6 cm) Weight: 100 lb 1.4 oz (45.4 kg) IBW/kg (Calculated) : 54.7 Heparin Dosing Weight: 44.5 kg  (patient stated weight)  Vital Signs: Temp: 98.2 F (36.8 C) (01/06 0722) Temp Source: Oral (01/06 0722) BP: 115/70 (01/06 0800) Pulse Rate: 93 (01/06 0800)  Labs: Recent Labs    05/28/18 1342  05/29/18 0051 05/29/18 1029 05/30/18 0237 05/31/18 0230 05/31/18 0642  HGB 10.5*  --  10.4*  --  9.8* 10.2*  --   HCT 35.7*  --  33.7*  --  32.6* 33.6*  --   PLT 106*  --  106*  --  127* 136*  --   LABPROT  --   --   --   --  14.0 14.3  --   INR  --   --   --   --  1.09 1.12  --   HEPARINUNFRC  --    < > 0.55 0.57 0.43 0.48  --   CREATININE 6.51*  --   --   --   --   --  10.44*   < > = values in this interval not displayed.    Estimated Creatinine Clearance: 3.5 mL/min (A) (by C-G formula based on SCr of 10.44 mg/dL (H)).   Assessment: 73 yo female with h/o COPD, ESRD on HD admitted with complaints of SOA. CT evidence of PE found without heart strain.  Day 3 of Warfarin / Heparin  Heparin level 0.48 INR today = 1.12 Heparin level therapeutic CBC stable  Goal of Therapy:  Heparin level 0.3-0.7 units/ml Monitor platelets by anticoagulation protocol: Yes  INR = 2 to 3   Plan:  Cont heparin at 800 units/hr Repeat Warfarin 5 mg po x 1 today Daily heparin level, CBC, INR  Lochlan Grygiel A. Levada Dy, PharmD, [place] Please utilize Amion for appropriate phone number to reach the unit pharmacist ([place])

## 2018-05-31 NOTE — Progress Notes (Signed)
[facility] Progress Note  Patient: Natasha [name] GMW:[id number]   PCP: [name], MD DOB: 1946/02/28   DOA: 05/28/2018   DOS: 05/31/2018   Date of Service: the patient was seen and examined on 05/31/2018  Brief hospital course: Pt. with PMH of  Crohn's disease, ESRD on HD, hypothyroidism, COPD; admitted on 05/28/2018, presented with complaint of shortness of breath, was found to have acute pulmonary embolism. Currently further plan is continue anticoagulation.  Subjective: Patient had hypotension hypoxic episode during the dialysis.  No nausea no vomiting.  Reports 4-4 bowel movements per day which is common for her.  No fever no chills.  No abdominal pain.  Assessment and Plan: 1.   Acute pulmonary embolism. Chronic left leg DVT - Presents with progressive SOB and cough, Unable to complete HD treatment secondary to shortness of breath. Underwent CT scan of the chest which is positive for left segmental single pulmonary embolism. Lower extremity Doppler is also positive for chronic DVT in the left leg. Patient has known history of prior clots in the left leg. Started on IV heparin, was on Coumadin in the past without any complication. Due to her ESRD Coumadin will be the only outpatient treatment available. Transition to Coumadin and heparin combination will need to be bridged in the hospital.  2. Hypokalemia  - Will be corrected at HD.  Continue to monitor.  3. ESRD  - Typically on MWF schedule for HD  - Appreciate nephrology assistance for hemodialysis.  4.   COPD, chronic respiratory failure on 2 LPM [place] -No acute exacerbation.  Continue home regimen.  On 2 L of oxygen.   5. Hypothyroidism  - Continue Synthroid   6. Crohn's colitis  - She is followed by GI, managed with Humira and daily low-dose prednisone, currently monitor.  7.  Underweight  Body mass index is 17.18 kg/m.  Nutrition Problem: Severe Malnutrition Etiology: chronic illness(ESRD  HD) Interventions: Interventions: Prostat  Diet: renal diet  Advance goals of care discussion: full code  Family Communication: no family was present at bedside, at the time of interview.  Disposition:  Discharge to home in 4-5 days once INR is therapeutic.  Consultants: nephrology Procedures: none  Scheduled Meds: . calcitRIOL  1.75 mcg Oral Q M,W,F-HD  . Chlorhexidine Gluconate Cloth  6 each Topical Q0600  . cyanocobalamin  1,000 mcg Intramuscular Q30 days  . feeding supplement (PRO-STAT SUGAR FREE 64)  30 mL Oral BID  . levothyroxine  125 mcg Oral QAC breakfast  . [START ON 06/02/2018] midodrine  10 mg Oral Q M,W,F  . predniSONE  5 mg Oral Q breakfast  . sodium chloride flush  3 mL Intravenous Q12H  . Warfarin - Pharmacist Dosing Inpatient   Does not apply q1800   Continuous Infusions: . sodium chloride    . heparin 800 Units/hr (05/31/18 0045)   PRN Meds: sodium chloride, acetaminophen, albuterol, HYDROcodone-acetaminophen, [DISCONTINUED] ondansetron **OR** ondansetron (ZOFRAN) IV, ondansetron, sodium chloride flush Antibiotics: Anti-infectives (From admission, onward)   None       Objective: Physical Exam: Vitals:   05/31/18 1130 05/31/18 1145 05/31/18 1251 05/31/18 1735  BP: (!) 77/50 (!) 104/57 (!) 82/65 (!) 90/55  Pulse: 92 93 98 97  Resp:  18    Temp:  97.9 F (36.6 C)  98.2 F (36.8 C)  TempSrc:  Oral  Oral  SpO2:  99%  100%  Weight:      Height:        Intake/Output Summary (Last  24 hours) at 05/31/2018 1817 Last data filed at 05/31/2018 1600 Gross per 24 hour  Intake 271.64 ml  Output 1622 ml  Net -1350.36 ml   Filed Weights   05/29/18 0050 05/31/18 0722  Weight: 46.2 kg 45.4 kg   General: Alert, Awake and Oriented to Time, Place and Person. Appear in mild distress, affect appropriate Eyes: PERRL, Conjunctiva normal ENT: Oral Mucosa clear moist. Neck: no JVD, no Abnormal Mass Or lumps Cardiovascular: S1 and S2 Present, no Murmur, Peripheral  Pulses Present Respiratory: normal respiratory effort, Bilateral Air entry equal and Decreased, no use of accessory muscle, Clear to Auscultation, no Crackles, no wheezes Abdomen: Bowel Sound present, Soft and no tenderness, no hernia Skin: no redness, no Rash, no induration Extremities: bilateral trace Pedal edema, no calf tenderness Neurologic: Grossly no focal neuro deficit. Bilaterally Equal motor strength  Data Reviewed: CBC: Recent Labs  Lab 05/28/18 1342 05/29/18 0051 05/30/18 0237 05/31/18 0230  WBC 4.1 2.3* 6.0 4.7  NEUTROABS 2.3  --   --   --   HGB 10.5* 10.4* 9.8* 10.2*  HCT 35.7* 33.7* 32.6* 33.6*  MCV 104.7* 102.4* 104.2* 103.7*  PLT 106* 106* 127* 158*   Basic Metabolic Panel: Recent Labs  Lab 05/28/18 1342 05/31/18 0642  NA 139 136  K 3.1* 4.1  CL 105 108  CO2 25 13*  GLUCOSE 79 52*  BUN 19 70*  CREATININE 6.51* 10.44*  CALCIUM 8.5* 8.2*  PHOS  --  7.9*    Liver Function Tests: Recent Labs  Lab 05/28/18 1342 05/31/18 0642  AST 15  --   ALT 9  --   ALKPHOS 49  --   BILITOT 0.5  --   PROT 6.1*  --   ALBUMIN 2.7* 2.4*   No results for input(s): LIPASE, AMYLASE in the last 168 hours. No results for input(s): AMMONIA in the last 168 hours. Coagulation Profile: Recent Labs  Lab 05/30/18 0237 05/31/18 0230  INR 1.09 1.12   Cardiac Enzymes: No results for input(s): CKTOTAL, CKMB, CKMBINDEX, TROPONINI in the last 168 hours. BNP (last 3 results) No results for input(s): PROBNP in the last 8760 hours. CBG: No results for input(s): GLUCAP in the last 168 hours. Studies: No results found.   Time spent: 35 minutes  Author: Berle Mull, MD [facility] Pager: [phone] 05/31/2018 6:17 PM  Between 7PM-7AM, please contact night-coverage at [email or web address], password [facility]

## 2018-05-31 NOTE — Progress Notes (Addendum)
[facility] Progress Note   Subjective:  C/O consistent SOB worse then usual .    Objective Vitals:   05/31/18 1100 05/31/18 1130 05/31/18 1145 05/31/18 1251  BP: (!) 88/50 (!) 77/50 (!) 104/57 (!) 82/65  Pulse: 92 92 93 98  Resp:   18   Temp:   97.9 F (36.6 C)   TempSrc:   Oral   SpO2:   99%   Weight:      Height:       Physical Exam General: Elderly female on nasal O2 NAD, pleasant Heart: RRR  Lungs: Decreased air movement bilat. No wheeze, rhonchi Abdomen: soft NT  Extremities: No LE edema  Dialysis Access: LUE AVG +bruit    Dialysis Orders:  [facility] MWF 4h6mn  400/800  44kg   4K/2.25 bath  P4  Hep 4200  LUE AVG Mircera 1087m IV q 2 weeks (last 12/24) Calcitriol 1.75 mcg PO TIW   Assessment/Plan: 1. LUL PE- Heparin/warfarin per primary. LUL subsegmental clot, no other clots noted. Hx of LLE DVT   2. ESRD - Usually MWF. Missed last HD due to scheduling.  HD today back on sched. 3. SOB - small surface for PE but CT chest and CXR c/w edema, ^UF goal 4. Hypotension- chronic on midodrine, ^ 5 > 10 mg pre HD 5. Anemia  - Hgb 10.4. On ESA as outpatient. Follow trends  6. Metabolic bone disease -  Ca ok. Continue VDRA. OP Phos controlled. No binders.  7. Nutrition - Renal diet/protein supp for low albumin  8. Crohn's disease/chronic diarrhea  9. Hx COPD/Emphysema   RoKelly SplinterD CaNewell Rubbermaidgr [phone] 05/31/2018, 1:25 PM    ' Additional Objective Labs: Basic Metabolic Panel: Recent Labs  Lab 05/28/18 1342 05/31/18 0642  NA 139 136  K 3.1* 4.1  CL 105 108  CO2 25 13*  GLUCOSE 79 52*  BUN 19 70*  CREATININE 6.51* 10.44*  CALCIUM 8.5* 8.2*  PHOS  --  7.9*   CBC: Recent Labs  Lab 05/28/18 1342 05/29/18 0051 05/30/18 0237 05/31/18 0230  WBC 4.1 2.3* 6.0 4.7  NEUTROABS 2.3  --   --   --   HGB 10.5* 10.4* 9.8* 10.2*  HCT 35.7* 33.7* 32.6* 33.6*  MCV 104.7* 102.4* 104.2* 103.7*  PLT 106* 106* 127* 136*    Blood Culture    Component Value Date/Time   SDES URINE, CLEAN CATCH 03/19/2017 1934   SPECREQUEST NONE 03/19/2017 1934   CULT (A) 03/19/2017 1934    40,000 COLONIES/mL LACTOBACILLUS SPECIES Standardized susceptibility testing for this organism is not available.    REPTSTATUS 03/21/2017 FINAL 03/19/2017 1934    Cardiac Enzymes: No results for input(s): CKTOTAL, CKMB, CKMBINDEX, TROPONINI in the last 168 hours. CBG: No results for input(s): GLUCAP in the last 168 hours. Iron Studies: No results for input(s): IRON, TIBC, TRANSFERRIN, FERRITIN in the last 72 hours. Lab Results  Component Value Date   INR 1.12 05/31/2018   INR 1.09 05/30/2018   INR 1.16 03/20/2017   Medications: . sodium chloride    . heparin 800 Units/hr (05/31/18 0045)   . calcitRIOL  1.75 mcg Oral Q M,W,F-HD  . Chlorhexidine Gluconate Cloth  6 each Topical Q0600  . cyanocobalamin  1,000 mcg Intramuscular Q30 days  . feeding supplement (PRO-STAT SUGAR FREE 64)  30 mL Oral BID  . levothyroxine  125 mcg Oral QAC breakfast  . midodrine  5 mg Oral Q M,W,F  .  predniSONE  5 mg Oral Q breakfast  . sodium chloride flush  3 mL Intravenous Q12H  . warfarin  5 mg Oral ONCE-1800  . Warfarin - Pharmacist Dosing Inpatient   Does not apply [phone]

## 2018-05-31 NOTE — Care Management Important Message (Signed)
Important Message  Patient Details  Name: Natasha [name] MRN: [id number] Date of Birth: [birth year]   Medicare Important Message Given:  Yes    Orbie Pyo 05/31/2018, 3:47 PM

## 2018-06-01 ENCOUNTER — Inpatient Hospital Stay (HOSPITAL_COMMUNITY): Payer: Medicare Other

## 2018-06-01 LAB — CBC
HCT: 33.5 % — ABNORMAL LOW (ref 36.0–46.0)
Hemoglobin: 10 g/dL — ABNORMAL LOW (ref 12.0–15.0)
MCH: 30.5 pg (ref 26.0–34.0)
MCHC: 29.9 g/dL — ABNORMAL LOW (ref 30.0–36.0)
MCV: 102.1 fL — ABNORMAL HIGH (ref 80.0–100.0)
Platelets: 126 10*3/uL — ABNORMAL LOW (ref 150–400)
RBC: 3.28 MIL/uL — ABNORMAL LOW (ref 3.87–5.11)
RDW: 16.1 % — AB (ref 11.5–15.5)
WBC: 4.5 10*3/uL (ref 4.0–10.5)
nRBC: 0 % (ref 0.0–0.2)

## 2018-06-01 LAB — HEPARIN LEVEL (UNFRACTIONATED): Heparin Unfractionated: 0.44 IU/mL (ref 0.30–0.70)

## 2018-06-01 LAB — PROTIME-INR
INR: 1.22
Prothrombin Time: 15.3 seconds — ABNORMAL HIGH (ref 11.4–15.2)

## 2018-06-01 MED ORDER — WARFARIN SODIUM 5 MG PO TABS
5.0000 mg | ORAL_TABLET | Freq: Once | ORAL | Status: AC
  Administered 2018-06-01: 5 mg via ORAL
  Filled 2018-06-01: qty 1

## 2018-06-01 MED ORDER — LOPERAMIDE HCL 2 MG PO CAPS
2.0000 mg | ORAL_CAPSULE | Freq: Two times a day (BID) | ORAL | Status: DC | PRN
  Administered 2018-06-01 – 2018-06-06 (×6): 2 mg via ORAL
  Filled 2018-06-01 (×6): qty 1

## 2018-06-01 MED ORDER — CHLORHEXIDINE GLUCONATE CLOTH 2 % EX PADS
6.0000 | MEDICATED_PAD | Freq: Every day | CUTANEOUS | Status: DC
  Administered 2018-06-02 – 2018-06-04 (×3): 6 via TOPICAL

## 2018-06-01 NOTE — Progress Notes (Signed)
[place] for Heparin / Warfarin Indication: Pulmonary embolism  Allergies  Allergen Reactions  . Mercaptopurine Other (See Comments)    Caused pancreatitis  . Remicade [Infliximab] Other (See Comments)    CAUSED JOINT PAIN    Patient Measurements: Height: 5' 4"  (162.6 cm) Weight: 99 lb 3.3 oz (45 kg) IBW/kg (Calculated) : 54.7 Heparin Dosing Weight: 44.5 kg  (patient stated weight)  Vital Signs: Temp: 98 F (36.7 C) (01/07 0725) Temp Source: Oral (01/07 0725) BP: 108/72 (01/07 0725) Pulse Rate: 94 (01/07 0725)  Labs: Recent Labs    05/30/18 0237 05/31/18 0230 05/31/18 0642 06/01/18 0314  HGB 9.8* 10.2*  --  10.0*  HCT 32.6* 33.6*  --  33.5*  PLT 127* 136*  --  126*  LABPROT 14.0 14.3  --  15.3*  INR 1.09 1.12  --  1.22  HEPARINUNFRC 0.43 0.48  --  0.44  CREATININE  --   --  10.44*  --     Estimated Creatinine Clearance: 3.5 mL/min (A) (by C-G formula based on SCr of 10.44 mg/dL (H)).   Assessment: 73 yo female with h/o COPD, ESRD on HD admitted with complaints of SOA. CT evidence of PE found without heart strain.  Day 4 of Warfarin / Heparin  Heparin level remains therapeutic at 0.44 INR remains SUBtherapeutic today at 1.22 CBC stable  Goal of Therapy:  INR 2-3 Heparin level 0.3-0.7 units/ml Monitor platelets by anticoagulation protocol: Yes    Plan:  Cont heparin at 800 units/hr Give Warfarin 5 mg po x 1 today Monitor daily heparin level, INR, CBC, clinical course, s/sx of bleed, PO intake, DDI   Thank you for allowing [place] to participate in this patients care.   Jens Som, PharmD Please utilize Amion (under [place]) for appropriate number for your unit pharmacist. 06/01/2018 10:16 AM

## 2018-06-01 NOTE — Progress Notes (Signed)
[facility] Progress Note  Patient: Natasha [name] TMA:[id number]   PCP: [name], MD DOB: Nov 20, 1945   DOA: 05/28/2018   DOS: 06/01/2018   Date of Service: the patient was seen and examined on 06/01/2018  Brief hospital course: Pt. with PMH of  Crohn's disease, ESRD on HD, hypothyroidism, COPD; admitted on 05/28/2018, presented with complaint of shortness of breath, was found to have acute pulmonary embolism. Currently further plan is continue anticoagulation.  Subjective: No acute complaint.  Still reports 4-5 bowel movements in a day. No nausea no vomiting no abdominal pain.  Assessment and Plan: 1.   Acute pulmonary embolism. Chronic left leg DVT - Presents with progressive SOB and cough, Unable to complete HD treatment secondary to shortness of breath. Underwent CT scan of the chest which is positive for left segmental single pulmonary embolism. Lower extremity Doppler is also positive for chronic DVT in the left leg. Patient has known history of prior clots in the left leg. Started on IV heparin, was on Coumadin in the past without any complication. Due to her ESRD Coumadin will be the only outpatient treatment available. Transition to Coumadin and heparin combination will need to be bridged in the hospital.  2. Hypokalemia  - Will be corrected at HD.  Continue to monitor.  3. ESRD  - Typically on MWF schedule for HD  - Appreciate nephrology assistance for hemodialysis.  4.   COPD, chronic respiratory failure on 2 LPM [place] -No acute exacerbation.  Continue home regimen.  On 2 L of oxygen.   5. Hypothyroidism  - Continue Synthroid   6. Crohn's colitis  - She is followed by GI, managed with Humira and daily low-dose prednisone, currently monitor.  PRN Imodium.  7.  Underweight  Body mass index is 17.03 kg/m.  Nutrition Problem: Severe Malnutrition Etiology: chronic illness(ESRD HD) Interventions: Interventions: Prostat  Diet: renal  diet  Advance goals of care discussion: full code  Family Communication: no family was present at bedside, at the time of interview.  Disposition:  Discharge to home in 4-5 days once INR is therapeutic.  Consultants: nephrology Procedures: none  Scheduled Meds: . calcitRIOL  1.75 mcg Oral Q M,W,F-HD  . Chlorhexidine Gluconate Cloth  6 each Topical Q0600  . [START ON 06/02/2018] Chlorhexidine Gluconate Cloth  6 each Topical Q0600  . cyanocobalamin  1,000 mcg Intramuscular Q30 days  . feeding supplement (PRO-STAT SUGAR FREE 64)  30 mL Oral BID  . levothyroxine  125 mcg Oral QAC breakfast  . [START ON 06/02/2018] midodrine  10 mg Oral Q M,W,F  . predniSONE  5 mg Oral Q breakfast  . sodium chloride flush  3 mL Intravenous Q12H  . Warfarin - Pharmacist Dosing Inpatient   Does not apply q1800   Continuous Infusions: . sodium chloride    . heparin 800 Units/hr (06/01/18 0506)   PRN Meds: sodium chloride, acetaminophen, albuterol, HYDROcodone-acetaminophen, loperamide, [DISCONTINUED] ondansetron **OR** ondansetron (ZOFRAN) IV, ondansetron, sodium chloride flush Antibiotics: Anti-infectives (From admission, onward)   None       Objective: Physical Exam: Vitals:   05/31/18 2300 06/01/18 0642 06/01/18 0725 06/01/18 1700  BP: (!) 101/52  108/72 110/70  Pulse: 98  94 95  Resp: 18     Temp: 97.8 F (36.6 C)  98 F (36.7 C) 98 F (36.7 C)  TempSrc: Oral  Oral Oral  SpO2: 100%  100% 100%  Weight:  45 kg    Height:  Intake/Output Summary (Last 24 hours) at 06/01/2018 1825 Last data filed at 06/01/2018 1600 Gross per 24 hour  Intake 433.85 ml  Output -  Net 433.85 ml   Filed Weights   05/29/18 0050 05/31/18 0722 06/01/18 0642  Weight: 46.2 kg 45.4 kg 45 kg   General: Alert, Awake and Oriented to Time, Place and Person. Appear in mild distress, affect appropriate Eyes: PERRL, Conjunctiva normal ENT: Oral Mucosa clear moist. Neck: no JVD, no Abnormal Mass Or  lumps Cardiovascular: S1 and S2 Present, no Murmur, Peripheral Pulses Present Respiratory: normal respiratory effort, Bilateral Air entry equal and Decreased, no use of accessory muscle, Clear to Auscultation, no Crackles, no wheezes Abdomen: Bowel Sound present, Soft and no tenderness, no hernia Skin: no redness, no Rash, no induration Extremities: bilateral trace Pedal edema, no calf tenderness Neurologic: Grossly no focal neuro deficit. Bilaterally Equal motor strength  Data Reviewed: CBC: Recent Labs  Lab 05/28/18 1342 05/29/18 0051 05/30/18 0237 05/31/18 0230 06/01/18 0314  WBC 4.1 2.3* 6.0 4.7 4.5  NEUTROABS 2.3  --   --   --   --   HGB 10.5* 10.4* 9.8* 10.2* 10.0*  HCT 35.7* 33.7* 32.6* 33.6* 33.5*  MCV 104.7* 102.4* 104.2* 103.7* 102.1*  PLT 106* 106* 127* 136* 263*   Basic Metabolic Panel: Recent Labs  Lab 05/28/18 1342 05/31/18 0642  NA 139 136  K 3.1* 4.1  CL 105 108  CO2 25 13*  GLUCOSE 79 52*  BUN 19 70*  CREATININE 6.51* 10.44*  CALCIUM 8.5* 8.2*  PHOS  --  7.9*    Liver Function Tests: Recent Labs  Lab 05/28/18 1342 05/31/18 0642  AST 15  --   ALT 9  --   ALKPHOS 49  --   BILITOT 0.5  --   PROT 6.1*  --   ALBUMIN 2.7* 2.4*   No results for input(s): LIPASE, AMYLASE in the last 168 hours. No results for input(s): AMMONIA in the last 168 hours. Coagulation Profile: Recent Labs  Lab 05/30/18 0237 05/31/18 0230 06/01/18 0314  INR 1.09 1.12 1.22   Cardiac Enzymes: No results for input(s): CKTOTAL, CKMB, CKMBINDEX, TROPONINI in the last 168 hours. BNP (last 3 results) No results for input(s): PROBNP in the last 8760 hours. CBG: No results for input(s): GLUCAP in the last 168 hours. Studies: Dg Chest 2 View  Result Date: 06/01/2018 CLINICAL DATA:  Short of breath, history of pulmonary embolism, history of COPD EXAM: CHEST - 2 VIEW COMPARISON:  CT chest of 05/28/2018, chest x-ray of the same date, and chest x-ray of 03/29/2017 FINDINGS: The  lungs remain hyperaerated with flattened hemidiaphragms consistent with emphysematous change. No pneumonia or effusion is seen. Mediastinal and hilar contours are unremarkable. The heart is stable in size being upper limits of normal. The bones are osteopenic. A left vascular stent overlies the left axilla. IMPRESSION: No active cardiopulmonary disease.  Emphysema. Electronically Signed   By: Ivar Drape M.D.   On: 06/01/2018 08:38     Time spent: 35 minutes  Author: Berle Mull, MD [facility] Pager: [phone] 06/01/2018 6:25 PM  Between 7PM-7AM, please contact night-coverage at [email or web address], password [facility]

## 2018-06-01 NOTE — Progress Notes (Addendum)
[facility] Progress Note   Subjective:   Patient seen and examined at bedside.  States her breathing is improved.  Reports hypotensive episode during HD yesterday w/dizziness and n/v.  No other complaints.   Objective Vitals:   05/31/18 1735 05/31/18 2300 06/01/18 0642 06/01/18 0725  BP: (!) 90/55 (!) 101/52  108/72  Pulse: 97 98  94  Resp:  18    Temp: 98.2 F (36.8 C) 97.8 F (36.6 C)  98 F (36.7 C)  TempSrc: Oral Oral  Oral  SpO2: 100% 100%  100%  Weight:   45 kg   Height:       Physical Exam General:NAD, thin, pleasant female, sitting in bed, on 3.5L O2 via [place] Heart:RRR, no MRG Lungs:mostly CTAB, BS decreased  Abdomen:soft, NTND Extremities:no edema Dialysis Access: LU AVG, +b/t   Filed Weights   05/29/18 0050 05/31/18 0722 06/01/18 0642  Weight: 46.2 kg 45.4 kg 45 kg    Intake/Output Summary (Last 24 hours) at 06/01/2018 1235 Last data filed at 06/01/2018 0845 Gross per 24 hour  Intake 479.7 ml  Output -  Net 479.7 ml    Additional Objective Labs: Basic Metabolic Panel: Recent Labs  Lab 05/28/18 1342 05/31/18 0642  NA 139 136  K 3.1* 4.1  CL 105 108  CO2 25 13*  GLUCOSE 79 52*  BUN 19 70*  CREATININE 6.51* 10.44*  CALCIUM 8.5* 8.2*  PHOS  --  7.9*   Liver Function Tests: Recent Labs  Lab 05/28/18 1342 05/31/18 0642  AST 15  --   ALT 9  --   ALKPHOS 49  --   BILITOT 0.5  --   PROT 6.1*  --   ALBUMIN 2.7* 2.4*   CBC: Recent Labs  Lab 05/28/18 1342 05/29/18 0051 05/30/18 0237 05/31/18 0230 06/01/18 0314  WBC 4.1 2.3* 6.0 4.7 4.5  NEUTROABS 2.3  --   --   --   --   HGB 10.5* 10.4* 9.8* 10.2* 10.0*  HCT 35.7* 33.7* 32.6* 33.6* 33.5*  MCV 104.7* 102.4* 104.2* 103.7* 102.1*  PLT 106* 106* 127* 136* 126*    Lab Results  Component Value Date   INR 1.22 06/01/2018   INR 1.12 05/31/2018   INR 1.09 05/30/2018   Studies/Results: Dg Chest 2 View  Result Date: 06/01/2018 CLINICAL DATA:  Short of breath, history of  pulmonary embolism, history of COPD EXAM: CHEST - 2 VIEW COMPARISON:  CT chest of 05/28/2018, chest x-ray of the same date, and chest x-ray of 03/29/2017 FINDINGS: The lungs remain hyperaerated with flattened hemidiaphragms consistent with emphysematous change. No pneumonia or effusion is seen. Mediastinal and hilar contours are unremarkable. The heart is stable in size being upper limits of normal. The bones are osteopenic. A left vascular stent overlies the left axilla. IMPRESSION: No active cardiopulmonary disease.  Emphysema. Electronically Signed   By: Ivar Drape M.D.   On: 06/01/2018 08:38    Medications: . sodium chloride    . heparin 800 Units/hr (06/01/18 0506)   . calcitRIOL  1.75 mcg Oral Q M,W,F-HD  . Chlorhexidine Gluconate Cloth  6 each Topical Q0600  . cyanocobalamin  1,000 mcg Intramuscular Q30 days  . feeding supplement (PRO-STAT SUGAR FREE 64)  30 mL Oral BID  . levothyroxine  125 mcg Oral QAC breakfast  . [START ON 06/02/2018] midodrine  10 mg Oral Q M,W,F  . predniSONE  5 mg Oral Q breakfast  . sodium chloride flush  3 mL Intravenous  Q12H  . warfarin  5 mg Oral ONCE-1800  . Warfarin - Pharmacist Dosing Inpatient   Does not apply q1800    Dialysis Orders: [facility] MWF 4h46mn  400/800  44kg   4K/2.25 bath  P4  Hep 4200  LUE AVG Mircera 1090m IV q 2 weeks (last 12/24) Calcitriol 1.75 mcg PO TIW  Assessment/Plan: 1. LUL PE - Heparin/warfarin per primary.  LUL subsegmental clot w/no other clots noted.  2. SOB - Hx COPD on home O2. +PE. CT/CXR suspicious for increased volume but unable to pull increased goal during HD yesterday.  No edema.  Will attempt to titrate down volume as tolerated.  2. ESRD - on HD MWF.  Plan for HD tomorrow per regular schedule with UF goal to standing weight of 43.5kg. K 4.1.  3. Anemia of CKD- Hgb 10.0.  Follow trends. ESA last dosed on 12/24. 4. Secondary hyperparathyroidism - Ca in goal. Phos elevated. Ca acetate stopped earlier this month due to  low phos. Recheck with labs tomorrow, if remains elevated will need to restart binder. Continue VDRA. 5. Hypotension - chronic, on midodrine.  Increased from 5>1068mre HD. 6. Nutrition - Alb 2.4. Renal diet w/fluid restrictions.  Renal vitamins. Nepro.  7. Crohn's disease/chronic diarrhea 8. Hx COPD/Emphysema  LinJen MowA-C [facility] Pager: [phone]/2020,12:35 PM  LOS: 4 days   Pt seen, examined and agree w A/P as above.  RobKelly Splinter Car[employer]ell Rubbermaidger [phone] 37301/11/2018, 1:04 PM

## 2018-06-02 LAB — CBC
HCT: 33.4 % — ABNORMAL LOW (ref 36.0–46.0)
HEMOGLOBIN: 9.9 g/dL — AB (ref 12.0–15.0)
MCH: 30.4 pg (ref 26.0–34.0)
MCHC: 29.6 g/dL — ABNORMAL LOW (ref 30.0–36.0)
MCV: 102.5 fL — ABNORMAL HIGH (ref 80.0–100.0)
Platelets: 139 10*3/uL — ABNORMAL LOW (ref 150–400)
RBC: 3.26 MIL/uL — AB (ref 3.87–5.11)
RDW: 15.8 % — ABNORMAL HIGH (ref 11.5–15.5)
WBC: 4.8 10*3/uL (ref 4.0–10.5)
nRBC: 0 % (ref 0.0–0.2)

## 2018-06-02 LAB — RENAL FUNCTION PANEL
ANION GAP: 12 (ref 5–15)
Albumin: 2.8 g/dL — ABNORMAL LOW (ref 3.5–5.0)
BUN: 50 mg/dL — ABNORMAL HIGH (ref 8–23)
CO2: 20 mmol/L — ABNORMAL LOW (ref 22–32)
Calcium: 8.9 mg/dL (ref 8.9–10.3)
Chloride: 105 mmol/L (ref 98–111)
Creatinine, Ser: 8.24 mg/dL — ABNORMAL HIGH (ref 0.44–1.00)
GFR calc Af Amer: 5 mL/min — ABNORMAL LOW (ref >60)
GFR calc non Af Amer: 4 mL/min — ABNORMAL LOW (ref >60)
Glucose, Bld: 91 mg/dL (ref 70–99)
Phosphorus: 7.7 mg/dL — ABNORMAL HIGH (ref 2.5–4.6)
Potassium: 3.2 mmol/L — ABNORMAL LOW (ref 3.5–5.1)
SODIUM: 137 mmol/L (ref 135–145)

## 2018-06-02 LAB — HEPARIN LEVEL (UNFRACTIONATED): Heparin Unfractionated: 0.33 IU/mL (ref 0.30–0.70)

## 2018-06-02 LAB — PROTIME-INR
INR: 1.5
Prothrombin Time: 17.9 seconds — ABNORMAL HIGH (ref 11.4–15.2)

## 2018-06-02 MED ORDER — WARFARIN SODIUM 3 MG PO TABS
6.0000 mg | ORAL_TABLET | Freq: Once | ORAL | Status: AC
  Administered 2018-06-02: 6 mg via ORAL
  Filled 2018-06-02: qty 2
  Filled 2018-06-02: qty 1

## 2018-06-02 MED ORDER — CALCITRIOL 0.5 MCG PO CAPS
ORAL_CAPSULE | ORAL | Status: AC
  Filled 2018-06-02: qty 3

## 2018-06-02 MED ORDER — CALCIUM ACETATE (PHOS BINDER) 667 MG PO CAPS
667.0000 mg | ORAL_CAPSULE | Freq: Three times a day (TID) | ORAL | Status: DC
  Administered 2018-06-02 – 2018-06-06 (×10): 667 mg via ORAL
  Filled 2018-06-02 (×10): qty 1

## 2018-06-02 MED ORDER — LIDOCAINE HCL (PF) 1 % IJ SOLN: 5.0000 mL | INTRAMUSCULAR | Status: DC | PRN

## 2018-06-02 MED ORDER — SODIUM CHLORIDE 0.9 % IV SOLN: 100.0000 mL | INTRAVENOUS | Status: DC | PRN

## 2018-06-02 MED ORDER — HEPARIN SODIUM (PORCINE) 1000 UNIT/ML DIALYSIS
4200.0000 [IU] | INTRAMUSCULAR | Status: DC | PRN
  Administered 2018-06-02: 4200 [IU] via INTRAVENOUS_CENTRAL
  Filled 2018-06-02: qty 5

## 2018-06-02 MED ORDER — CALCITRIOL 0.25 MCG PO CAPS
ORAL_CAPSULE | ORAL | Status: AC
  Filled 2018-06-02: qty 1

## 2018-06-02 MED ORDER — LIDOCAINE-PRILOCAINE 2.5-2.5 % EX CREA
1.0000 "application " | TOPICAL_CREAM | CUTANEOUS | Status: DC | PRN
  Filled 2018-06-02: qty 5

## 2018-06-02 MED ORDER — PENTAFLUOROPROP-TETRAFLUOROETH EX AERO: 1.0000 "application " | INHALATION_SPRAY | CUTANEOUS | Status: DC | PRN

## 2018-06-02 MED ORDER — MIDODRINE HCL 5 MG PO TABS
ORAL_TABLET | ORAL | Status: AC
  Filled 2018-06-02: qty 2

## 2018-06-02 MED ORDER — HEPARIN SODIUM (PORCINE) 1000 UNIT/ML IJ SOLN
INTRAMUSCULAR | Status: AC
  Filled 2018-06-02: qty 5

## 2018-06-02 MED ORDER — HEPARIN SODIUM (PORCINE) 1000 UNIT/ML DIALYSIS
1000.0000 [IU] | INTRAMUSCULAR | Status: DC | PRN
  Filled 2018-06-02: qty 1

## 2018-06-02 NOTE — Progress Notes (Addendum)
[facility] Progress Note   Subjective:   Patient seen and examined at bedside in HD, tolerating well.  UF goal 2.5L.  BP in goal.  SOB improved.  Denies CP, n/v/d, dizziness and weakness.  No specific complaints. No events overnight.   Objective Vitals:   06/02/18 0750 06/02/18 0800 06/02/18 0830 06/02/18 0900  BP: 114/65 134/65 121/66 113/62  Pulse: 84 82 85 80  Resp: 17 16 17 16   Temp:      TempSrc:      SpO2:      Weight:      Height:       Physical Exam General:NAD, thin, pleasant female, sitting in bed on 2L O2 via [place] Heart:RRR, no meg Lungs:CTAB Abdomen:soft, NTND Extremities:no edema Dialysis Access: LU AVG cannulated   Filed Weights   06/01/18 0642 06/02/18 0327 06/02/18 0745  Weight: 45 kg 44.5 kg 44.5 kg    Intake/Output Summary (Last 24 hours) at 06/02/2018 0922 Last data filed at 06/01/2018 1600 Gross per 24 hour  Intake 58.01 ml  Output -  Net 58.01 ml    Additional Objective Labs: Basic Metabolic Panel: Recent Labs  Lab 05/28/18 1342 05/31/18 0642 06/02/18 0226  NA 139 136 137  K 3.1* 4.1 3.2*  CL 105 108 105  CO2 25 13* 20*  GLUCOSE 79 52* 91  BUN 19 70* 50*  CREATININE 6.51* 10.44* 8.24*  CALCIUM 8.5* 8.2* 8.9  PHOS  --  7.9* 7.7*   Liver Function Tests: Recent Labs  Lab 05/28/18 1342 05/31/18 0642 06/02/18 0226  AST 15  --   --   ALT 9  --   --   ALKPHOS 49  --   --   BILITOT 0.5  --   --   PROT 6.1*  --   --   ALBUMIN 2.7* 2.4* 2.8*   CBC: Recent Labs  Lab 05/28/18 1342 05/29/18 0051 05/30/18 0237 05/31/18 0230 06/01/18 0314 06/02/18 0226  WBC 4.1 2.3* 6.0 4.7 4.5 4.8  NEUTROABS 2.3  --   --   --   --   --   HGB 10.5* 10.4* 9.8* 10.2* 10.0* 9.9*  HCT 35.7* 33.7* 32.6* 33.6* 33.5* 33.4*  MCV 104.7* 102.4* 104.2* 103.7* 102.1* 102.5*  PLT 106* 106* 127* 136* 126* 139*    Lab Results  Component Value Date   INR 1.50 06/02/2018   INR 1.22 06/01/2018   INR 1.12 05/31/2018   Studies/Results: Dg  Chest 2 View  Result Date: 06/01/2018 CLINICAL DATA:  Short of breath, history of pulmonary embolism, history of COPD EXAM: CHEST - 2 VIEW COMPARISON:  CT chest of 05/28/2018, chest x-ray of the same date, and chest x-ray of 03/29/2017 FINDINGS: The lungs remain hyperaerated with flattened hemidiaphragms consistent with emphysematous change. No pneumonia or effusion is seen. Mediastinal and hilar contours are unremarkable. The heart is stable in size being upper limits of normal. The bones are osteopenic. A left vascular stent overlies the left axilla. IMPRESSION: No active cardiopulmonary disease.  Emphysema. Electronically Signed   By: Ivar Drape M.D.   On: 06/01/2018 08:38    Medications: . sodium chloride    . sodium chloride    . sodium chloride    . heparin 850 Units/hr (06/02/18 0841)   . calcitRIOL      . calcitRIOL      . calcitRIOL  1.75 mcg Oral Q M,W,F-HD  . Chlorhexidine Gluconate Cloth  6 each Topical Q0600  . Chlorhexidine  Gluconate Cloth  6 each Topical [id number]  . cyanocobalamin  1,000 mcg Intramuscular Q30 days  . feeding supplement (PRO-STAT SUGAR FREE 64)  30 mL Oral BID  . levothyroxine  125 mcg Oral QAC breakfast  . midodrine  10 mg Oral Q M,W,F  . predniSONE  5 mg Oral Q breakfast  . sodium chloride flush  3 mL Intravenous Q12H  . warfarin  6 mg Oral ONCE-1800  . Warfarin - Pharmacist Dosing Inpatient   Does not apply q1800    Dialysis Orders: [facility] MWF 4h18mn 400/800 44kg 4K/2.25 bath P4 Hep 4200 LUE AVG Mircera 1068m IV q 2 weeks (last 12/24) Calcitriol 1.75 mcg PO TIW  Assessment/Plan: 1. LUL PE - Heparin/warfarin per primary.  LUL subsegmental clot w/no other clots noted.  2. SOB/Volume - Hx COPD on home O2. +PE. CT/CXR suspicious for volume overload however no vol excess on exam and UF on HD difficult due to symptoms and BP drops. Tolerating UF goal today, continue to titrate down as tolerated. Pre weight 44.5kg.  2. ESRD - on HD MWF.  K 3.2, using  4K bath. net UFG 2L.  Tolerating well.  Next HD on 06/04/18. 3. Anemia of CKD- Hgb 10.0>9.9.  Follow trends.  4. Secondary hyperparathyroidism - Ca in goal. Phos elevated. Ca acetate stopped earlier this month due to low phos. Start Phoslo 1AC TID.  Continue VDRA. 5. Hypotension - chronic, on midodrine.  Increased from 5>1025mre HD. 6. Nutrition - Alb 2.4. Renal diet w/fluid restrictions.  Renal vitamins. Nepro.  7. Crohn's disease/chronic diarrhea 8. Hx COPD/Emphysema   LinJen MowA-C [facility] Pager: [phone]/2020,9:22 AM  LOS: 5 days   Pt seen, examined and agree w A/P as above.  RobKelly Splinter [facility] [phone]/12/2018, 11:25 AM

## 2018-06-02 NOTE — Progress Notes (Signed)
[facility] Progress Note  Patient: Natasha [name] DIY:[id number]   PCP: [name], MD DOB: 1945-07-21   DOA: 05/28/2018   DOS: 06/02/2018   Date of Service: the patient was seen and examined on 06/02/2018  Brief hospital course: Pt. with PMH of  Crohn's disease, ESRD on HD, hypothyroidism, COPD; admitted on 05/28/2018, presented with complaint of shortness of breath, was found to have acute pulmonary embolism. -On IV heparin also noted to have chronic DVT -Also component of volume overload contributing  Subjective:   Assessment and Plan: 1.   Acute pulmonary embolism. Chronic left leg DVT -CT angiogram of the chest was positive for single left segmental pulmonary embolism and Dopplers noted chronic left leg DVT -Due to ESRD, not a candidate for DoAcs -Continue Coumadin with heparin bridge, INR is 1.5 today -DC home when INR is greater than 2  2.  Acute on chronic respiratory failure -Multifactorial, has COPD on 2 L home O2 at baseline, in addition now with acute small PE and potentially small component of volume overload -Has been losing weight for a while and needs dry weight lowered accordingly -Volume removal per renal, limited by hypotension and dialysis  3.  ESRD on hemodialysis Monday Wednesday Friday -Per renal  4.  Hypokalemia  - Will be corrected at HD.   5.   COPD, chronic respiratory failure on 2 LPM [place] -No acute exacerbation.  Continue home regimen.  On 2 L of oxygen.   5. Hypothyroidism  - Continue Synthroid   6. Crohn's colitis  - She is followed by GI, managed with Humira and daily low-dose prednisone, currently monitor.  PRN Imodium.  7.  Severe protein calorie malnutrition /underweight  Body mass index is 16.12 kg/m.  Interventions: Prostat  Diet: renal diet  Advance goals of care discussion: full code  Family Communication: Husband at bedside Disposition: Home when INR is therapeutic  Consultants: nephrology Procedures:  none  Scheduled Meds: . calcitRIOL  1.75 mcg Oral Q M,W,F-HD  . calcium acetate  667 mg Oral TID WC  . Chlorhexidine Gluconate Cloth  6 each Topical Q0600  . Chlorhexidine Gluconate Cloth  6 each Topical Q0600  . cyanocobalamin  1,000 mcg Intramuscular Q30 days  . feeding supplement (PRO-STAT SUGAR FREE 64)  30 mL Oral BID  . levothyroxine  125 mcg Oral QAC breakfast  . midodrine  10 mg Oral Q M,W,F  . predniSONE  5 mg Oral Q breakfast  . sodium chloride flush  3 mL Intravenous Q12H  . warfarin  6 mg Oral ONCE-1800  . Warfarin - Pharmacist Dosing Inpatient   Does not apply q1800   Continuous Infusions: . sodium chloride    . heparin 850 Units/hr (06/02/18 1443)   PRN Meds: sodium chloride, acetaminophen, albuterol, HYDROcodone-acetaminophen, loperamide, [DISCONTINUED] ondansetron **OR** ondansetron (ZOFRAN) IV, ondansetron, sodium chloride flush Antibiotics: Anti-infectives (From admission, onward)   None       Objective: Physical Exam: Vitals:   06/02/18 1145 06/02/18 1200 06/02/18 1206 06/02/18 1210  BP: (!) 89/52 (!) 81/54 (!) 90/57 116/66  Pulse: 98 98 95 (!) 113  Resp:  16 18   Temp:   97.9 F (36.6 C)   TempSrc:   Oral   SpO2: 100% 100% 100%   Weight:   42.6 kg   Height:        Intake/Output Summary (Last 24 hours) at 06/02/2018 1512 Last data filed at 06/02/2018 1206 Gross per 24 hour  Intake 58.01 ml  Output 1181  ml  Net -1122.99 ml   Filed Weights   06/02/18 0327 06/02/18 0745 06/02/18 1206  Weight: 44.5 kg 44.5 kg 42.6 kg   Gen: Frail cachectic chronically ill-appearing female, sitting up in bed, no distress HEENT: PERRLA, Neck supple, no JVD Lungs: Poor air movement bilaterally, rest clear CVS: RRR,No Gallops,Rubs or new Murmurs Abd: soft, Non tender, non distended, BS present Extremities: No edema Skin: no new rashes   Data Reviewed: CBC: Recent Labs  Lab 05/28/18 1342 05/29/18 0051 05/30/18 0237 05/31/18 0230 06/01/18 0314 06/02/18 0226   WBC 4.1 2.3* 6.0 4.7 4.5 4.8  NEUTROABS 2.3  --   --   --   --   --   HGB 10.5* 10.4* 9.8* 10.2* 10.0* 9.9*  HCT 35.7* 33.7* 32.6* 33.6* 33.5* 33.4*  MCV 104.7* 102.4* 104.2* 103.7* 102.1* 102.5*  PLT 106* 106* 127* 136* 126* 625*   Basic Metabolic Panel: Recent Labs  Lab 05/28/18 1342 05/31/18 0642 06/02/18 0226  NA 139 136 137  K 3.1* 4.1 3.2*  CL 105 108 105  CO2 25 13* 20*  GLUCOSE 79 52* 91  BUN 19 70* 50*  CREATININE 6.51* 10.44* 8.24*  CALCIUM 8.5* 8.2* 8.9  PHOS  --  7.9* 7.7*    Liver Function Tests: Recent Labs  Lab 05/28/18 1342 05/31/18 0642 06/02/18 0226  AST 15  --   --   ALT 9  --   --   ALKPHOS 49  --   --   BILITOT 0.5  --   --   PROT 6.1*  --   --   ALBUMIN 2.7* 2.4* 2.8*   No results for input(s): LIPASE, AMYLASE in the last 168 hours. No results for input(s): AMMONIA in the last 168 hours. Coagulation Profile: Recent Labs  Lab 05/30/18 0237 05/31/18 0230 06/01/18 0314 06/02/18 0226  INR 1.09 1.12 1.22 1.50   Cardiac Enzymes: No results for input(s): CKTOTAL, CKMB, CKMBINDEX, TROPONINI in the last 168 hours. BNP (last 3 results) No results for input(s): PROBNP in the last 8760 hours. CBG: No results for input(s): GLUCAP in the last 168 hours. Studies: No results found.   Time spent: 25 minutes  Author: Domenic Polite, MD 06/02/2018 3:12 PM

## 2018-06-02 NOTE — Progress Notes (Signed)
[facility] for Heparin / Warfarin Indication: Pulmonary embolism  Allergies  Allergen Reactions  . Mercaptopurine Other (See Comments)    Caused pancreatitis  . Remicade [Infliximab] Other (See Comments)    CAUSED JOINT PAIN    Patient Measurements: Height: 5' 4"  (162.6 cm) Weight: 98 lb 1.7 oz (44.5 kg) IBW/kg (Calculated) : 54.7 Heparin Dosing Weight: 44.5 kg  (patient stated weight)  Vital Signs: Temp: 98.1 F (36.7 C) (01/08 0745) Temp Source: Oral (01/08 0745) BP: 121/66 (01/08 0830) Pulse Rate: 85 (01/08 0830)  Labs: Recent Labs    05/31/18 0230 05/31/18 0642 06/01/18 0314 06/02/18 0226  HGB 10.2*  --  10.0* 9.9*  HCT 33.6*  --  33.5* 33.4*  PLT 136*  --  126* 139*  LABPROT 14.3  --  15.3* 17.9*  INR 1.12  --  1.22 1.50  HEPARINUNFRC 0.48  --  0.44 0.33  CREATININE  --  10.44*  --  8.24*    Estimated Creatinine Clearance: 4.3 mL/min (A) (by C-G formula based on SCr of 8.24 mg/dL (H)).   Assessment: 73 yo female with h/o COPD, ESRD on HD admitted with complaints of SOA. CT evidence of PE found without heart strain.  Day 4 of Warfarin / Heparin  Heparin level remains therapeutic at 0.33 INR remains SUBtherapeutic today at 1.5 CBC stable  Goal of Therapy:  INR 2-3 Heparin level 0.3-0.7 units/ml Monitor platelets by anticoagulation protocol: Yes    Plan:  Increase heparin to 850 units/hr Give Warfarin 6 mg po x 1 today Monitor daily heparin level, INR, CBC, clinical course, s/sx of bleed, PO intake, DDI  Jamere Stidham A. Levada Dy, PharmD, [place] Pager: [phone] Please utilize Amion for appropriate phone number to reach the unit pharmacist ([place])   06/02/2018 8:36 AM

## 2018-06-03 LAB — RENAL FUNCTION PANEL
Albumin: 2.7 g/dL — ABNORMAL LOW (ref 3.5–5.0)
Anion gap: 10 (ref 5–15)
BUN: 29 mg/dL — ABNORMAL HIGH (ref 8–23)
CO2: 23 mmol/L (ref 22–32)
Calcium: 8.9 mg/dL (ref 8.9–10.3)
Chloride: 103 mmol/L (ref 98–111)
Creatinine, Ser: 5.32 mg/dL — ABNORMAL HIGH (ref 0.44–1.00)
GFR calc Af Amer: 9 mL/min — ABNORMAL LOW (ref >60)
GFR calc non Af Amer: 7 mL/min — ABNORMAL LOW (ref >60)
Glucose, Bld: 75 mg/dL (ref 70–99)
POTASSIUM: 3.2 mmol/L — AB (ref 3.5–5.1)
Phosphorus: 5.3 mg/dL — ABNORMAL HIGH (ref 2.5–4.6)
Sodium: 136 mmol/L (ref 135–145)

## 2018-06-03 LAB — HEPARIN LEVEL (UNFRACTIONATED): Heparin Unfractionated: 0.32 IU/mL (ref 0.30–0.70)

## 2018-06-03 LAB — CBC
HCT: 34.3 % — ABNORMAL LOW (ref 36.0–46.0)
Hemoglobin: 10.6 g/dL — ABNORMAL LOW (ref 12.0–15.0)
MCH: 31.6 pg (ref 26.0–34.0)
MCHC: 30.9 g/dL (ref 30.0–36.0)
MCV: 102.4 fL — AB (ref 80.0–100.0)
Platelets: 128 10*3/uL — ABNORMAL LOW (ref 150–400)
RBC: 3.35 MIL/uL — ABNORMAL LOW (ref 3.87–5.11)
RDW: 15.9 % — ABNORMAL HIGH (ref 11.5–15.5)
WBC: 4.8 10*3/uL (ref 4.0–10.5)
nRBC: 0 % (ref 0.0–0.2)

## 2018-06-03 LAB — PROTIME-INR
INR: 1.59
Prothrombin Time: 18.8 seconds — ABNORMAL HIGH (ref 11.4–15.2)

## 2018-06-03 MED ORDER — CHLORHEXIDINE GLUCONATE CLOTH 2 % EX PADS
6.0000 | MEDICATED_PAD | Freq: Every day | CUTANEOUS | Status: DC
  Administered 2018-06-04: 6 via TOPICAL

## 2018-06-03 MED ORDER — MIDODRINE HCL 5 MG PO TABS
5.0000 mg | ORAL_TABLET | ORAL | Status: DC
  Administered 2018-06-04: 5 mg via ORAL

## 2018-06-03 MED ORDER — WARFARIN SODIUM 7.5 MG PO TABS
7.5000 mg | ORAL_TABLET | Freq: Once | ORAL | Status: AC
  Administered 2018-06-03: 7.5 mg via ORAL
  Filled 2018-06-03: qty 1

## 2018-06-03 NOTE — Progress Notes (Signed)
[facility] Progress Note  Patient: Natasha [name] RFF:[id number]   PCP: [name], MD DOB: May 23, 1946   DOA: 05/28/2018   DOS: 06/03/2018   Date of Service: the patient was seen and examined on 06/03/2018  Brief hospital course: Pt. with PMH of  Crohn's disease, ESRD on HD, hypothyroidism, COPD; admitted on 05/28/2018, presented with complaint of shortness of breath, was found to have acute pulmonary embolism. -On IV heparin also noted to have chronic DVT -Also component of volume overload contributing -Improving, INR still subtherapeutic  Subjective:   Assessment and Plan: 1.   Acute pulmonary embolism/Chronic left leg DVT -CT angiogram of the chest was positive for single left segmental pulmonary embolism and Dopplers noted chronic left leg DVT -Due to ESRD, not a candidate for DoAcs -Continue Coumadin with heparin bridge, INR is 1.59 today -DC home when INR is greater than 2 -Remains stable overall  2.  Acute on chronic respiratory failure -Multifactorial, has COPD on 2 L home O2 at baseline, in addition now with acute small PE and potentially small component of volume overload -Has been losing weight for a while and needs dry weight lowered accordingly -Volume removal  limited by hypotension and dialysis  3.  ESRD on hemodialysis Monday Wednesday Friday -Per renal, last HD yesterday  4.  Hypokalemia  - To be corrected with HD  5.   COPD, chronic respiratory failure on 2 LPM [place] -No acute exacerbation.  Continue home regimen.  On 2 L of oxygen.   5. Hypothyroidism  - Continue Synthroid   6. Crohn's colitis  - She is followed by GI, managed with Humira and daily low-dose prednisone, currently monitor.  PRN Imodium.  7.  Severe protein calorie malnutrition /underweight  Body mass index is 16.12 kg/m.  Interventions: Prostat  Diet: renal diet  Advance goals of care discussion: full code  Family Communication: Husband at bedside Disposition: Home  when INR is therapeutic  Consultants: nephrology Procedures: none  Scheduled Meds: . calcitRIOL  1.75 mcg Oral Q M,W,F-HD  . calcium acetate  667 mg Oral TID WC  . Chlorhexidine Gluconate Cloth  6 each Topical Q0600  . Chlorhexidine Gluconate Cloth  6 each Topical Q0600  . cyanocobalamin  1,000 mcg Intramuscular Q30 days  . feeding supplement (PRO-STAT SUGAR FREE 64)  30 mL Oral BID  . levothyroxine  125 mcg Oral QAC breakfast  . midodrine  10 mg Oral Q M,W,F  . predniSONE  5 mg Oral Q breakfast  . sodium chloride flush  3 mL Intravenous Q12H  . warfarin  7.5 mg Oral ONCE-1800  . Warfarin - Pharmacist Dosing Inpatient   Does not apply q1800   Continuous Infusions: . sodium chloride    . heparin 900 Units/hr (06/03/18 0950)   PRN Meds: sodium chloride, acetaminophen, albuterol, HYDROcodone-acetaminophen, loperamide, [DISCONTINUED] ondansetron **OR** ondansetron (ZOFRAN) IV, ondansetron, sodium chloride flush Antibiotics: Anti-infectives (From admission, onward)   None       Objective: Physical Exam: Vitals:   06/02/18 1210 06/02/18 1649 06/02/18 2248 06/03/18 0717  BP: 116/66 91/65 107/61 97/62  Pulse: (!) 113 (!) 105 (!) 101 94  Resp:  13 20 16   Temp:  98.2 F (36.8 C) 98.8 F (37.1 C) 98.5 F (36.9 C)  TempSrc:  Oral Oral Oral  SpO2:  99% 100% 99%  Weight:      Height:        Intake/Output Summary (Last 24 hours) at 06/03/2018 1155 Last data filed at 06/03/2018 1014  Gross per 24 hour  Intake 812.61 ml  Output 1181 ml  Net -368.39 ml   Filed Weights   06/02/18 0327 06/02/18 0745 06/02/18 1206  Weight: 44.5 kg 44.5 kg 42.6 kg   Gen: Thinly built, cachectic, awake, Alert, Oriented X 3, no distress HEENT: PERRLA, Neck supple, no JVD Lungs: Improving air movement, rest clear CVS: RRR,No Gallops,Rubs or new Murmurs Abd: soft, Non tender, non distended, BS present Extremities: No edema Skin: no new rashes  Data Reviewed: CBC: Recent Labs  Lab 05/28/18 1342   05/30/18 0237 05/31/18 0230 06/01/18 0314 06/02/18 0226 06/03/18 0251  WBC 4.1   < > 6.0 4.7 4.5 4.8 4.8  NEUTROABS 2.3  --   --   --   --   --   --   HGB 10.5*   < > 9.8* 10.2* 10.0* 9.9* 10.6*  HCT 35.7*   < > 32.6* 33.6* 33.5* 33.4* 34.3*  MCV 104.7*   < > 104.2* 103.7* 102.1* 102.5* 102.4*  PLT 106*   < > 127* 136* 126* 139* 128*   < > = values in this interval not displayed.   Basic Metabolic Panel: Recent Labs  Lab 05/28/18 1342 05/31/18 0642 06/02/18 0226 06/03/18 0251  NA 139 136 137 136  K 3.1* 4.1 3.2* 3.2*  CL 105 108 105 103  CO2 25 13* 20* 23  GLUCOSE 79 52* 91 75  BUN 19 70* 50* 29*  CREATININE 6.51* 10.44* 8.24* 5.32*  CALCIUM 8.5* 8.2* 8.9 8.9  PHOS  --  7.9* 7.7* 5.3*    Liver Function Tests: Recent Labs  Lab 05/28/18 1342 05/31/18 0642 06/02/18 0226 06/03/18 0251  AST 15  --   --   --   ALT 9  --   --   --   ALKPHOS 49  --   --   --   BILITOT 0.5  --   --   --   PROT 6.1*  --   --   --   ALBUMIN 2.7* 2.4* 2.8* 2.7*   No results for input(s): LIPASE, AMYLASE in the last 168 hours. No results for input(s): AMMONIA in the last 168 hours. Coagulation Profile: Recent Labs  Lab 05/30/18 0237 05/31/18 0230 06/01/18 0314 06/02/18 0226 06/03/18 0251  INR 1.09 1.12 1.22 1.50 1.59   Cardiac Enzymes: No results for input(s): CKTOTAL, CKMB, CKMBINDEX, TROPONINI in the last 168 hours. BNP (last 3 results) No results for input(s): PROBNP in the last 8760 hours. CBG: No results for input(s): GLUCAP in the last 168 hours. Studies: No results found.   Time spent: 25 minutes  Author: Domenic Polite, MD 06/03/2018 11:55 AM

## 2018-06-03 NOTE — Progress Notes (Signed)
ANTICOAGULATION CONSULT NOTE - Follow Up Consult  Pharmacy Consult for Heparin and Coumadin Indication: pulmonary embolus and chronic LLE DVT  Allergies  Allergen Reactions  . Mercaptopurine Other (See Comments)    Caused pancreatitis  . Remicade [Infliximab] Other (See Comments)    CAUSED JOINT PAIN    Patient Measurements: Height: 5' 4"  (162.6 cm) Weight: 93 lb 14.7 oz (42.6 kg) IBW/kg (Calculated) : 54.7 Heparin Dosing Weight: 42.6 kg  Vital Signs: Temp: 98.5 F (36.9 C) (01/09 0717) Temp Source: Oral (01/09 0717) BP: 97/62 (01/09 0717) Pulse Rate: 94 (01/09 0717)  Labs: Recent Labs    06/01/18 0314 06/02/18 0226 06/03/18 0251  HGB 10.0* 9.9* 10.6*  HCT 33.5* 33.4* 34.3*  PLT 126* 139* 128*  LABPROT 15.3* 17.9* 18.8*  INR 1.22 1.50 1.59  HEPARINUNFRC 0.44 0.33 0.32  CREATININE  --  8.24* 5.32*   Assessment:  73 yo female with h/o COPD, ESRD on HD admitted with complaints of SOB. CT evidence of PE found without heart strain. Venous duplex showed chronic left leg DVT.    Heparin level is low therapeutic (0.32) on 850 units/hr.   INR 1.59 after Coumadin 5 mg x 4 days then 6 mg x 1 yesterday. Day # 6 overlap but INR slow to increase. Patient reports increased diarrhea the last few days.   Platelet count low stable, no bleeding reported.  Goal of Therapy:  INR 2-3 Heparin level 0.3-0.7 units/ml Monitor platelets by anticoagulation protocol: Yes   Plan:   Increase Heparin drip to 900 units/hr   Increase Coumadin to 7.5 mg x 1 today.  Daily heparin level, PT/INR and CBC.  Arty Baumgartner, [place] Pager: [phone] 06/03/2018,9:49 AM

## 2018-06-03 NOTE — Progress Notes (Signed)
[facility] Progress Note   Subjective:   Again didn't tolerate attempt to lower dry wt  Objective Vitals:   06/02/18 1210 06/02/18 1649 06/02/18 2248 06/03/18 0717  BP: 116/66 91/65 107/61 97/62  Pulse: (!) 113 (!) 105 (!) 101 94  Resp:  13 20 16   Temp:  98.2 F (36.8 C) 98.8 F (37.1 C) 98.5 F (36.9 C)  TempSrc:  Oral Oral Oral  SpO2:  99% 100% 99%  Weight:      Height:       Physical Exam General:NAD, thin, pleasant female, sitting in bed on 2L O2 via [place] Heart:RRR, no rales, poor air movement in general Lungs:CTAB Abdomen:soft, NTND Extremities:no edema Dialysis Access: LU AVG cannulated   Filed Weights   06/02/18 0327 06/02/18 0745 06/02/18 1206  Weight: 44.5 kg 44.5 kg 42.6 kg    Intake/Output Summary (Last 24 hours) at 06/03/2018 1212 Last data filed at 06/03/2018 1014 Gross per 24 hour  Intake 812.61 ml  Output -  Net 812.61 ml    Additional Objective Labs: Basic Metabolic Panel: Recent Labs  Lab 05/31/18 0642 06/02/18 0226 06/03/18 0251  NA 136 137 136  K 4.1 3.2* 3.2*  CL 108 105 103  CO2 13* 20* 23  GLUCOSE 52* 91 75  BUN 70* 50* 29*  CREATININE 10.44* 8.24* 5.32*  CALCIUM 8.2* 8.9 8.9  PHOS 7.9* 7.7* 5.3*   Liver Function Tests: Recent Labs  Lab 05/28/18 1342 05/31/18 0642 06/02/18 0226 06/03/18 0251  AST 15  --   --   --   ALT 9  --   --   --   ALKPHOS 49  --   --   --   BILITOT 0.5  --   --   --   PROT 6.1*  --   --   --   ALBUMIN 2.7* 2.4* 2.8* 2.7*   CBC: Recent Labs  Lab 05/28/18 1342  05/30/18 0237 05/31/18 0230 06/01/18 0314 06/02/18 0226 06/03/18 0251  WBC 4.1   < > 6.0 4.7 4.5 4.8 4.8  NEUTROABS 2.3  --   --   --   --   --   --   HGB 10.5*   < > 9.8* 10.2* 10.0* 9.9* 10.6*  HCT 35.7*   < > 32.6* 33.6* 33.5* 33.4* 34.3*  MCV 104.7*   < > 104.2* 103.7* 102.1* 102.5* 102.4*  PLT 106*   < > 127* 136* 126* 139* 128*   < > = values in this interval not displayed.    Lab Results  Component Value  Date   INR 1.59 06/03/2018   INR 1.50 06/02/2018   INR 1.22 06/01/2018   Studies/Results: No results found.  Medications: . sodium chloride    . heparin 900 Units/hr (06/03/18 0950)   . calcitRIOL  1.75 mcg Oral Q M,W,F-HD  . calcium acetate  667 mg Oral TID WC  . Chlorhexidine Gluconate Cloth  6 each Topical Q0600  . Chlorhexidine Gluconate Cloth  6 each Topical Q0600  . cyanocobalamin  1,000 mcg Intramuscular Q30 days  . feeding supplement (PRO-STAT SUGAR FREE 64)  30 mL Oral BID  . levothyroxine  125 mcg Oral QAC breakfast  . midodrine  10 mg Oral Q M,W,F  . predniSONE  5 mg Oral Q breakfast  . sodium chloride flush  3 mL Intravenous Q12H  . warfarin  7.5 mg Oral ONCE-1800  . Warfarin - Pharmacist Dosing Inpatient   Does not apply  q1800    Dialysis Orders: [facility] MWF 4h67mn 400/800 44kg 4K/2.25 bath P4 Hep 4200 LUE AVG Mircera 1074m IV q 2 weeks (last 12/24) Calcitriol 1.75 mcg PO TIW  Assessment/Plan: 1. LUL PE - Heparin/warfarin per primary.  LUL subsegmental clot w/no other clots noted.  2. SOB/Volume - Hx COPD on home O2. +PE. Unable to lower volume at all here, severe intradialytic symptoms. Will keep prior dry wt of 44kg, discont attempts to lower volume.  2. ESRD - on HD MWF. HD tomorrow if still here.  3. Anemia of CKD- Hgb 10.0>9.9.  Follow trends.  4. Secondary hyperparathyroidism - Ca in goal. Phos elevated. Ca acetate stopped earlier this month due to low phos. Start Phoslo 1AC TID.  Continue VDRA. 5. Hypotension - chronic, on midodrine.  Resume midodrine 5 tid 6. Nutrition - Alb 2.4. Renal diet w/fluid restrictions.  Renal vitamins. Nepro.  7. Crohn's disease/chronic diarrhea 8. Hx COPD/Emphysema    RoKelly SplinterD CaThe [facility] pager [phone] 06/03/2018, 12:12 PM

## 2018-06-04 LAB — HEPARIN LEVEL (UNFRACTIONATED): Heparin Unfractionated: 0.32 IU/mL (ref 0.30–0.70)

## 2018-06-04 LAB — CBC
HCT: 34.5 % — ABNORMAL LOW (ref 36.0–46.0)
Hemoglobin: 10.5 g/dL — ABNORMAL LOW (ref 12.0–15.0)
MCH: 31.4 pg (ref 26.0–34.0)
MCHC: 30.4 g/dL (ref 30.0–36.0)
MCV: 103.3 fL — ABNORMAL HIGH (ref 80.0–100.0)
Platelets: 148 10*3/uL — ABNORMAL LOW (ref 150–400)
RBC: 3.34 MIL/uL — ABNORMAL LOW (ref 3.87–5.11)
RDW: 15.8 % — AB (ref 11.5–15.5)
WBC: 5.3 10*3/uL (ref 4.0–10.5)
nRBC: 0 % (ref 0.0–0.2)

## 2018-06-04 LAB — RENAL FUNCTION PANEL
Albumin: 3 g/dL — ABNORMAL LOW (ref 3.5–5.0)
Anion gap: 14 (ref 5–15)
BUN: 50 mg/dL — AB (ref 8–23)
CO2: 17 mmol/L — ABNORMAL LOW (ref 22–32)
Calcium: 9.3 mg/dL (ref 8.9–10.3)
Chloride: 101 mmol/L (ref 98–111)
Creatinine, Ser: 8.02 mg/dL — ABNORMAL HIGH (ref 0.44–1.00)
GFR calc Af Amer: 5 mL/min — ABNORMAL LOW (ref >60)
GFR calc non Af Amer: 5 mL/min — ABNORMAL LOW (ref >60)
Glucose, Bld: 82 mg/dL (ref 70–99)
Phosphorus: 6.9 mg/dL — ABNORMAL HIGH (ref 2.5–4.6)
Potassium: 3.2 mmol/L — ABNORMAL LOW (ref 3.5–5.1)
Sodium: 132 mmol/L — ABNORMAL LOW (ref 135–145)

## 2018-06-04 LAB — PROTIME-INR
INR: 1.8
PROTHROMBIN TIME: 20.7 s — AB (ref 11.4–15.2)

## 2018-06-04 MED ORDER — CALCITRIOL 0.25 MCG PO CAPS
ORAL_CAPSULE | ORAL | Status: AC
  Filled 2018-06-04: qty 1

## 2018-06-04 MED ORDER — WARFARIN SODIUM 7.5 MG PO TABS
7.5000 mg | ORAL_TABLET | Freq: Once | ORAL | Status: AC
  Administered 2018-06-04: 7.5 mg via ORAL
  Filled 2018-06-04 (×2): qty 1

## 2018-06-04 MED ORDER — HEPARIN SODIUM (PORCINE) 1000 UNIT/ML DIALYSIS
4200.0000 [IU] | Freq: Once | INTRAMUSCULAR | Status: AC
  Administered 2018-06-04: 4200 [IU] via INTRAVENOUS_CENTRAL

## 2018-06-04 MED ORDER — POTASSIUM CHLORIDE CRYS ER 20 MEQ PO TBCR
20.0000 meq | EXTENDED_RELEASE_TABLET | Freq: Once | ORAL | Status: AC
  Administered 2018-06-04: 20 meq via ORAL
  Filled 2018-06-04: qty 1

## 2018-06-04 MED ORDER — HEPARIN SODIUM (PORCINE) 1000 UNIT/ML IJ SOLN
INTRAMUSCULAR | Status: AC
  Administered 2018-06-04: 4200 [IU] via INTRAVENOUS_CENTRAL
  Filled 2018-06-04: qty 5

## 2018-06-04 MED ORDER — CALCITRIOL 0.5 MCG PO CAPS
ORAL_CAPSULE | ORAL | Status: AC
  Filled 2018-06-04: qty 3

## 2018-06-04 MED ORDER — MIDODRINE HCL 5 MG PO TABS
ORAL_TABLET | ORAL | Status: AC
  Administered 2018-06-04: 5 mg via ORAL
  Filled 2018-06-04: qty 1

## 2018-06-04 NOTE — Progress Notes (Signed)
[facility] Progress Note  Patient: Natasha [name] WJX:[id number]   PCP: [name], MD DOB: [birth year]   DOA: 05/28/2018   DOS: 06/04/2018   Date of Service: the patient was seen and examined on 06/04/2018  Brief hospital course: Pt. with PMH of  Crohn's disease, ESRD on HD, hypothyroidism, COPD; admitted on 05/28/2018, presented with complaint of shortness of breath, was found to have acute pulmonary embolism. -On IV heparin also noted to have chronic DVT -Dyspnea felt to be multifactorial, secondary to COPD, mild volume overload and PE -Improving, INR still subtherapeutic  Subjective:   Assessment and Plan: 1.   Acute pulmonary embolism/Chronic left leg DVT -CT angiogram of the chest was positive for single left segmental pulmonary embolism and Dopplers noted chronic left leg DVT -Due to ESRD, not a candidate for DoAcs -Continue Coumadin with heparin bridge, INR is 1.8 today, discharge planning, home tomorrow if INR greater than 2  -Remained stable  2.  Acute on chronic respiratory failure -Multifactorial, has COPD on 2 L home O2 at baseline, in addition now with acute small PE and potentially small component of volume overload -Has been losing weight for a while and needs dry weight lowered accordingly -Felt to be euvolemic at this time  3.  ESRD on hemodialysis Monday Wednesday Friday -HD today per renal  4.  Hypokalemia  - corrected with HD  5.   COPD, chronic respiratory failure on 2 LPM [place] -No acute exacerbation.  Continue home regimen.  On 2 L of oxygen.   5. Hypothyroidism  - Continue Synthroid   6. Crohn's colitis  - She is followed by GI, managed with Humira and daily low-dose prednisone, currently monitor.  PRN Imodium.  7.  Severe protein calorie malnutrition /underweight  Body mass index is 16.42 kg/m.  Interventions: Prostat  Diet: renal diet  Advance goals of care discussion: full code  Family Communication: Husband at  bedside Disposition: Home when INR is therapeutic  Consultants: nephrology Procedures: none  Scheduled Meds: . calcitRIOL  1.75 mcg Oral Q M,W,F-HD  . calcium acetate  667 mg Oral TID WC  . Chlorhexidine Gluconate Cloth  6 each Topical Q0600  . cyanocobalamin  1,000 mcg Intramuscular Q30 days  . feeding supplement (PRO-STAT SUGAR FREE 64)  30 mL Oral BID  . levothyroxine  125 mcg Oral QAC breakfast  . midodrine  5 mg Oral Q M,W,F  . predniSONE  5 mg Oral Q breakfast  . sodium chloride flush  3 mL Intravenous Q12H  . warfarin  7.5 mg Oral ONCE-1800  . Warfarin - Pharmacist Dosing Inpatient   Does not apply q1800   Continuous Infusions: . sodium chloride    . heparin 900 Units/hr (06/03/18 2008)   PRN Meds: sodium chloride, acetaminophen, albuterol, HYDROcodone-acetaminophen, loperamide, [DISCONTINUED] ondansetron **OR** ondansetron (ZOFRAN) IV, ondansetron, sodium chloride flush Antibiotics: Anti-infectives (From admission, onward)   None       Objective: Physical Exam: Vitals:   06/04/18 1000 06/04/18 1030 06/04/18 1100 06/04/18 1115  BP: 110/62 93/61 98/75  105/70  Pulse: 90 91 97 88  Resp:    (!) 22  Temp:    97.6 F (36.4 C)  TempSrc:    Oral  SpO2:    98%  Weight:    43.4 kg  Height:        Intake/Output Summary (Last 24 hours) at 06/04/2018 1331 Last data filed at 06/04/2018 1115 Gross per 24 hour  Intake 66.82 ml  Output 100 ml  Net -33.18 ml   Filed Weights   06/02/18 1206 06/04/18 0655 06/04/18 1115  Weight: 42.6 kg 44.1 kg 43.4 kg   Gen: Frail, cachectic female awake, Alert, Oriented X 3, no distress HEENT: PERRLA, Neck supple, no JVD Lungs: Improving air movement, clear CVS: RRR,No Gallops,Rubs or new Murmurs Abd: soft, Non tender, non distended, BS present Extremities: No edema Skin: no new rashes  Data Reviewed: CBC: Recent Labs  Lab 05/28/18 1342  05/31/18 0230 06/01/18 0314 06/02/18 0226 06/03/18 0251 06/04/18 0645  WBC 4.1   < >  4.7 4.5 4.8 4.8 5.3  NEUTROABS 2.3  --   --   --   --   --   --   HGB 10.5*   < > 10.2* 10.0* 9.9* 10.6* 10.5*  HCT 35.7*   < > 33.6* 33.5* 33.4* 34.3* 34.5*  MCV 104.7*   < > 103.7* 102.1* 102.5* 102.4* 103.3*  PLT 106*   < > 136* 126* 139* 128* 148*   < > = values in this interval not displayed.   Basic Metabolic Panel: Recent Labs  Lab 05/28/18 1342 05/31/18 0642 06/02/18 0226 06/03/18 0251 06/04/18 0645  NA 139 136 137 136 132*  K 3.1* 4.1 3.2* 3.2* 3.2*  CL 105 108 105 103 101  CO2 25 13* 20* 23 17*  GLUCOSE 79 52* 91 75 82  BUN 19 70* 50* 29* 50*  CREATININE 6.51* 10.44* 8.24* 5.32* 8.02*  CALCIUM 8.5* 8.2* 8.9 8.9 9.3  PHOS  --  7.9* 7.7* 5.3* 6.9*    Liver Function Tests: Recent Labs  Lab 05/28/18 1342 05/31/18 0642 06/02/18 0226 06/03/18 0251 06/04/18 0645  AST 15  --   --   --   --   ALT 9  --   --   --   --   ALKPHOS 49  --   --   --   --   BILITOT 0.5  --   --   --   --   PROT 6.1*  --   --   --   --   ALBUMIN 2.7* 2.4* 2.8* 2.7* 3.0*   No results for input(s): LIPASE, AMYLASE in the last 168 hours. No results for input(s): AMMONIA in the last 168 hours. Coagulation Profile: Recent Labs  Lab 05/31/18 0230 06/01/18 0314 06/02/18 0226 06/03/18 0251 06/04/18 0319  INR 1.12 1.22 1.50 1.59 1.80   Cardiac Enzymes: No results for input(s): CKTOTAL, CKMB, CKMBINDEX, TROPONINI in the last 168 hours. BNP (last 3 results) No results for input(s): PROBNP in the last 8760 hours. CBG: No results for input(s): GLUCAP in the last 168 hours. Studies: No results found.   Time spent: 25 minutes  Author: Domenic Polite, MD 06/04/2018 1:31 PM

## 2018-06-04 NOTE — Care Management Note (Signed)
Case Management Note  Patient Details  Name: FREDDYE CARDAMONE MRN: [id number] Date of Birth: Jul 26, 1945  Subjective/Objective:  From home, NCM scheduled a hospital follow up apt with Dr. [name] and pt/inr check on 1/13 at 8 am.  NCM also informed patient, she states this is ok with her because she will start dialysis at 10:20.                    Action/Plan: DC home when ready.  Expected Discharge Date:                  Expected Discharge Plan:     In-House Referral:     Discharge planning Services  CM Consult  Post Acute Care Choice:    Choice offered to:     DME Arranged:    DME Agency:     HH Arranged:    [place] Agency:     Status of Service:  Completed, signed off  If discussed at [employer] of Stay Meetings, dates discussed:    Additional Comments:  Zenon Mayo, RN 06/04/2018, 3:58 PM

## 2018-06-04 NOTE — Progress Notes (Signed)
Pt finishing her CHG bath.  Imodium given pre dialysis per pt request.

## 2018-06-04 NOTE — Progress Notes (Signed)
Nutrition Follow Up  DOCUMENTATION CODES:   Severe malnutrition in context of chronic illness, Underweight  INTERVENTION:    Prostat liquid protein po 30 ml BID with meals, each supplement provides 100 kcal, 15 grams protein  NUTRITION DIAGNOSIS:   Severe Malnutrition related to chronic illness(ESRD HD) as evidenced by severe fat depletion, severe muscle depletion, ongoing  GOAL:   Patient will meet greater than or equal to 90% of their needs, met  MONITOR:   PO intake, Supplement acceptance, Labs, I & O's, Weight trends, Skin  ASSESSMENT:   73 y.o. female, with past medical history significant for end-stage renal disease on hemodialysis, history of COPD and history of DVT 6 years ago status post treatment with Coumadin for few months after which it was stopped.  Patient is presenting with 2 days history of increasing shortness of breath. Work-up showed left upper lobe PE.  Pt currently in [place]. PO intake excellent at 100% per flowsheets. Receiving Prostat liquid protein 30 ml BID.  Medications include Vit B-12, Phoslo & Imodium.  Labs reviewed. Na 132 (L). K 3.2 (L). D/C when INR therapeutic.  Diet Order:   Diet Order            Diet renal with fluid restriction Fluid restriction: 1200 mL Fluid; Room service appropriate? Yes; Fluid consistency: Thin  Diet effective now             EDUCATION NEEDS:   Education needs have been addressed  Skin:  Skin Assessment: Reviewed RN Assessment  Last BM:  1/9  Height:   Ht Readings from Last 1 Encounters:  05/29/18 5' 4"  (1.626 m)   Weight:   Wt Readings from Last 1 Encounters:  06/04/18 44.1 kg   Ideal Body Weight:  54.5 kg  BMI:  Body mass index is 16.69 kg/m.  Estimated Nutritional Needs:   Kcal:  1600-1800  Protein:  70-85 grams  Fluid:  1.2 L/day  Arthur Holms, RD, LDN Pager #: [phone] After-Hours Pager #: [phone]

## 2018-06-04 NOTE — Progress Notes (Addendum)
NCM called PCP office to see if could get apt for Monday for pt/inr check , awaiting call back. NCM called again at 14:39 ,awaiting call back.

## 2018-06-04 NOTE — Progress Notes (Signed)
To dialysis via bed on O2.

## 2018-06-04 NOTE — Procedures (Signed)
Pt on HD , no sig UF today, did not tolerate attempts x 2 to lower dry wt. Looks good on HD today.  Awaiting INR > 2 then dc.    I was present at this dialysis session, have reviewed the session itself and made  appropriate changes Kelly Splinter MD [place] pager [phone]   06/04/2018, 12:34 PM

## 2018-06-04 NOTE — Progress Notes (Signed)
ANTICOAGULATION CONSULT NOTE - Follow Up Consult  Pharmacy Consult for Heparin and Coumadin Indication: pulmonary embolus and chronic LLE DVT  Allergies  Allergen Reactions  . Mercaptopurine Other (See Comments)    Caused pancreatitis  . Remicade [Infliximab] Other (See Comments)    CAUSED JOINT PAIN    Patient Measurements: Height: 5' 4"  (162.6 cm) Weight: 97 lb 3.6 oz (44.1 kg) IBW/kg (Calculated) : 54.7 Heparin Dosing Weight: 42.6 kg  Vital Signs: Temp: 98.6 F (37 C) (01/10 0655) Temp Source: Oral (01/10 0655) BP: 98/75 (01/10 1100) Pulse Rate: 97 (01/10 1100)  Labs: Recent Labs    06/02/18 0226 06/03/18 0251 06/04/18 0319 06/04/18 0645  HGB 9.9* 10.6*  --  10.5*  HCT 33.4* 34.3*  --  34.5*  PLT 139* 128*  --  148*  LABPROT 17.9* 18.8* 20.7*  --   INR 1.50 1.59 1.80  --   HEPARINUNFRC 0.33 0.32 0.32  --   CREATININE 8.24* 5.32*  --  8.02*   Assessment:  73 yo female with h/o COPD, ESRD on HD admitted with complaints of SOB. CT evidence of PE found without heart strain. Venous duplex showed chronic left leg DVT.  Heparin level this morning remains therapeutic (HL 0.32, goal of 0.3-0.7). INR today is SUBtheurapeutic however trending up (INR 1.8 << 1.59, goal of 2-3). CBC stable - no bleeding noted.   Goal of Therapy:  INR 2-3 Heparin level 0.3-0.7 units/ml Monitor platelets by anticoagulation protocol: Yes   Plan:  - Continue Heparin at 900 units/hr (9 ml/hr) - Repeat Warfarin 7.5 mg x 1 dose at 1800 today - Educated on warfarin this admit - Will continue to monitor for any signs/symptoms of bleeding and will follow up with heparin level and PT/INR in the a.m.   Thank you for allowing pharmacy to be a part of this patient's care.  Alycia Rossetti, PharmD, BCPS Clinical Pharmacist Pager: [phone] Clinical phone for 06/04/2018 from 7a-3:30p: [phone] If after 3:30p, please call main pharmacy at: [phone] Please check AMION for all [place]  numbers 06/04/2018 11:35 AM

## 2018-06-05 LAB — RENAL FUNCTION PANEL
Albumin: 2.9 g/dL — ABNORMAL LOW (ref 3.5–5.0)
Anion gap: 12 (ref 5–15)
BUN: 25 mg/dL — ABNORMAL HIGH (ref 8–23)
CO2: 23 mmol/L (ref 22–32)
Calcium: 9.7 mg/dL (ref 8.9–10.3)
Chloride: 100 mmol/L (ref 98–111)
Creatinine, Ser: 4.91 mg/dL — ABNORMAL HIGH (ref 0.44–1.00)
GFR calc Af Amer: 10 mL/min — ABNORMAL LOW (ref >60)
GFR calc non Af Amer: 8 mL/min — ABNORMAL LOW (ref >60)
Glucose, Bld: 74 mg/dL (ref 70–99)
Phosphorus: 6.9 mg/dL — ABNORMAL HIGH (ref 2.5–4.6)
Potassium: 3.8 mmol/L (ref 3.5–5.1)
Sodium: 135 mmol/L (ref 135–145)

## 2018-06-05 LAB — CBC
HCT: 33.7 % — ABNORMAL LOW (ref 36.0–46.0)
Hemoglobin: 10.1 g/dL — ABNORMAL LOW (ref 12.0–15.0)
MCH: 30.6 pg (ref 26.0–34.0)
MCHC: 30 g/dL (ref 30.0–36.0)
MCV: 102.1 fL — ABNORMAL HIGH (ref 80.0–100.0)
Platelets: 132 10*3/uL — ABNORMAL LOW (ref 150–400)
RBC: 3.3 MIL/uL — ABNORMAL LOW (ref 3.87–5.11)
RDW: 15.5 % (ref 11.5–15.5)
WBC: 4.3 10*3/uL (ref 4.0–10.5)
nRBC: 0 % (ref 0.0–0.2)

## 2018-06-05 LAB — PROTIME-INR
INR: 1.91
Prothrombin Time: 21.7 seconds — ABNORMAL HIGH (ref 11.4–15.2)

## 2018-06-05 LAB — HEPARIN LEVEL (UNFRACTIONATED): Heparin Unfractionated: 0.41 IU/mL (ref 0.30–0.70)

## 2018-06-05 MED ORDER — WARFARIN SODIUM 10 MG PO TABS
10.0000 mg | ORAL_TABLET | Freq: Once | ORAL | Status: AC
  Administered 2018-06-05: 10 mg via ORAL
  Filled 2018-06-05: qty 1

## 2018-06-05 NOTE — Progress Notes (Signed)
[facility] Progress Note  Patient: Natasha [name] IZT:[id number]   PCP: [name], MD DOB: [birth year]   DOA: 05/28/2018   DOS: 06/05/2018   Date of Service: the patient was seen and examined on 06/05/2018  Brief hospital course: Pt. with PMH of  Crohn's disease, ESRD on HD, hypothyroidism, COPD; admitted on 05/28/2018, presented with complaint of shortness of breath, was found to have acute pulmonary embolism. -On IV heparin also noted to have chronic DVT -Dyspnea felt to be multifactorial, secondary to COPD, mild volume overload and PE -Improving, INR still subtherapeutic, very close  Subjective: Feels well, no complaints, breathing overall improving  Assessment and Plan: 1.   Acute pulmonary embolism/Chronic left leg DVT -CT angiogram of the chest was positive for single left segmental pulmonary embolism and Dopplers noted chronic left leg DVT -Due to ESRD, not a candidate for DoAcs -Continue Coumadin with heparin bridge, INR 1.9 today -Hopefully home tomorrow if >2  2.  Acute on chronic respiratory failure -Multifactorial, has COPD on 2 L home O2 at baseline, in addition now with acute small PE and potentially small component of volume overload -Improved, clinically euvolemic at this time  3.  ESRD on hemodialysis Monday Wednesday Friday -Last HD yesterday  4.  Hypokalemia  - corrected with HD  5.   COPD, chronic respiratory failure on 2 LPM [place] -No acute exacerbation.  Continue home regimen.  On 2 L of oxygen.   5. Hypothyroidism  - Continue Synthroid   6. Crohn's colitis  - She is followed by GI, managed with Humira and daily low-dose prednisone, currently monitor.  PRN Imodium.  7.  Severe protein calorie malnutrition /underweight  Body mass index is 16.42 kg/m.  Interventions: Prostat  Diet: renal diet  Advance goals of care discussion: full code  Family Communication: Husband at bedside Disposition: Home when INR is  therapeutic  Consultants: nephrology Procedures: none  Scheduled Meds: . calcitRIOL  1.75 mcg Oral Q M,W,F-HD  . calcium acetate  667 mg Oral TID WC  . Chlorhexidine Gluconate Cloth  6 each Topical Q0600  . cyanocobalamin  1,000 mcg Intramuscular Q30 days  . feeding supplement (PRO-STAT SUGAR FREE 64)  30 mL Oral BID  . levothyroxine  125 mcg Oral QAC breakfast  . midodrine  5 mg Oral Q M,W,F  . predniSONE  5 mg Oral Q breakfast  . sodium chloride flush  3 mL Intravenous Q12H  . warfarin  10 mg Oral ONCE-1800  . Warfarin - Pharmacist Dosing Inpatient   Does not apply q1800   Continuous Infusions: . sodium chloride    . heparin 900 Units/hr (06/05/18 0005)   PRN Meds: sodium chloride, acetaminophen, albuterol, HYDROcodone-acetaminophen, loperamide, [DISCONTINUED] ondansetron **OR** ondansetron (ZOFRAN) IV, ondansetron, sodium chloride flush Antibiotics: Anti-infectives (From admission, onward)   None       Objective: Physical Exam: Vitals:   06/04/18 1115 06/04/18 1708 06/04/18 2300 06/05/18 0839  BP: 105/70 107/65 105/67 106/68  Pulse: 88 95 80 92  Resp: (!) 22  16   Temp: 97.6 F (36.4 C) 98.7 F (37.1 C) 98.5 F (36.9 C) 98.3 F (36.8 C)  TempSrc: Oral Oral Oral Oral  SpO2: 98% 100% 100% 100%  Weight: 43.4 kg     Height:        Intake/Output Summary (Last 24 hours) at 06/05/2018 1100 Last data filed at 06/04/2018 1800 Gross per 24 hour  Intake 468.01 ml  Output 100 ml  Net 368.01 ml   Danley Danker  Weights   06/02/18 1206 06/04/18 0655 06/04/18 1115  Weight: 42.6 kg 44.1 kg 43.4 kg   Gen: Frail cachectic female, awake, Alert, Oriented X 3, no distress HEENT: PERRLA, Neck supple, no JVD Lungs: Clear bilaterally CVS: RRR,No Gallops,Rubs or new Murmurs Abd: soft, Non tender, non distended, BS present Extremities: No edema Skin: no new rashes  Data Reviewed: CBC: Recent Labs  Lab 06/01/18 0314 06/02/18 0226 06/03/18 0251 06/04/18 0645 06/05/18 0219  WBC  4.5 4.8 4.8 5.3 4.3  HGB 10.0* 9.9* 10.6* 10.5* 10.1*  HCT 33.5* 33.4* 34.3* 34.5* 33.7*  MCV 102.1* 102.5* 102.4* 103.3* 102.1*  PLT 126* 139* 128* 148* 168*   Basic Metabolic Panel: Recent Labs  Lab 05/31/18 0642 06/02/18 0226 06/03/18 0251 06/04/18 0645 06/05/18 0219  NA 136 137 136 132* 135  K 4.1 3.2* 3.2* 3.2* 3.8  CL 108 105 103 101 100  CO2 13* 20* 23 17* 23  GLUCOSE 52* 91 75 82 74  BUN 70* 50* 29* 50* 25*  CREATININE 10.44* 8.24* 5.32* 8.02* 4.91*  CALCIUM 8.2* 8.9 8.9 9.3 9.7  PHOS 7.9* 7.7* 5.3* 6.9* 6.9*    Liver Function Tests: Recent Labs  Lab 05/31/18 3729 06/02/18 0226 06/03/18 0251 06/04/18 0645 06/05/18 0219  ALBUMIN 2.4* 2.8* 2.7* 3.0* 2.9*   No results for input(s): LIPASE, AMYLASE in the last 168 hours. No results for input(s): AMMONIA in the last 168 hours. Coagulation Profile: Recent Labs  Lab 06/01/18 0314 06/02/18 0226 06/03/18 0251 06/04/18 0319 06/05/18 0219  INR 1.22 1.50 1.59 1.80 1.91   Cardiac Enzymes: No results for input(s): CKTOTAL, CKMB, CKMBINDEX, TROPONINI in the last 168 hours. BNP (last 3 results) No results for input(s): PROBNP in the last 8760 hours. CBG: No results for input(s): GLUCAP in the last 168 hours. Studies: No results found.   Time spent: 25 minutes  Author: Domenic Polite, MD 06/05/2018 11:00 AM

## 2018-06-05 NOTE — Progress Notes (Signed)
ANTICOAGULATION CONSULT NOTE - Follow Up Consult  Pharmacy Consult for Heparin and Coumadin Indication: pulmonary embolus and chronic LLE DVT  Allergies  Allergen Reactions  . Mercaptopurine Other (See Comments)    Caused pancreatitis  . Remicade [Infliximab] Other (See Comments)    CAUSED JOINT PAIN    Patient Measurements: Height: 5' 4"  (162.6 cm) Weight: 95 lb 10.9 oz (43.4 kg) IBW/kg (Calculated) : 54.7 Heparin Dosing Weight: 42.6 kg  Vital Signs: Temp: 98.3 F (36.8 C) (01/11 0839) Temp Source: Oral (01/11 0839) BP: 106/68 (01/11 0839) Pulse Rate: 92 (01/11 0839)  Labs: Recent Labs    06/03/18 0251 06/04/18 0319 06/04/18 0645 06/05/18 0219  HGB 10.6*  --  10.5* 10.1*  HCT 34.3*  --  34.5* 33.7*  PLT 128*  --  148* 132*  LABPROT 18.8* 20.7*  --  21.7*  INR 1.59 1.80  --  1.91  HEPARINUNFRC 0.32 0.32  --  0.41  CREATININE 5.32*  --  8.02* 4.91*   Assessment:  73 yo female with h/o COPD, ESRD on HD admitted with complaints of SOB. CT evidence of PE found without heart strain. Venous duplex showed chronic left leg DVT.  Heparin level this morning remains therapeutic (HL 0.41 << 0.32, goal of 0.3-0.7). INR today is SUBtheurapeutic however trending up (INR 1.91 << 1.8, goal of 2-3). CBC stable - no bleeding noted. Will give a higher warfarin dose today to ensure INR is into the therapeutic range on 1/12. Noted PCP follow-up appointment made for 1/13 AM.  Goal of Therapy:  INR 2-3 Heparin level 0.3-0.7 units/ml Monitor platelets by anticoagulation protocol: Yes   Plan:  - Continue Heparin at 900 units/hr (9 ml/hr) - Warfarin 10 mg x 1 dose at 1800 today - Educated on warfarin this admit - Will continue to monitor for any signs/symptoms of bleeding and will follow up with heparin level and PT/INR in the a.m.   Thank you for allowing pharmacy to be a part of this patient's care.  Alycia Rossetti, PharmD, BCPS Clinical Pharmacist Pager: [phone] Clinical  phone for 06/05/2018 from 7a-3:30p: [phone] If after 3:30p, please call main pharmacy at: [phone] Please check AMION for all [place] numbers 06/05/2018 10:39 AM

## 2018-06-05 NOTE — Progress Notes (Signed)
[name] Progress Note   Subjective:   Did very well on HD yest  Objective Vitals:   06/04/18 1115 06/04/18 1708 06/04/18 2300 06/05/18 0839  BP: 105/70 107/65 105/67 106/68  Pulse: 88 95 80 92  Resp: (!) 22  16   Temp: 97.6 F (36.4 C) 98.7 F (37.1 C) 98.5 F (36.9 C) 98.3 F (36.8 C)  TempSrc: Oral Oral Oral Oral  SpO2: 98% 100% 100% 100%  Weight: 43.4 kg     Height:       Physical Exam General:NAD, thin, pleasant female, sitting in bed on 2L O2 via [place] Heart:RRR, no rales, poor air movement in general Lungs:CTAB Abdomen:soft, NTND Extremities:no edema Dialysis Access: LU AVG cannulated   Filed Weights   06/02/18 1206 06/04/18 0655 06/04/18 1115  Weight: 42.6 kg 44.1 kg 43.4 kg    Intake/Output Summary (Last 24 hours) at 06/05/2018 0913 Last data filed at 06/04/2018 1800 Gross per 24 hour  Intake 468.01 ml  Output 100 ml  Net 368.01 ml    Additional Objective Labs: Basic Metabolic Panel: Recent Labs  Lab 06/03/18 0251 06/04/18 0645 06/05/18 0219  NA 136 132* 135  K 3.2* 3.2* 3.8  CL 103 101 100  CO2 23 17* 23  GLUCOSE 75 82 74  BUN 29* 50* 25*  CREATININE 5.32* 8.02* 4.91*  CALCIUM 8.9 9.3 9.7  PHOS 5.3* 6.9* 6.9*   Liver Function Tests: Recent Labs  Lab 06/03/18 0251 06/04/18 0645 06/05/18 0219  ALBUMIN 2.7* 3.0* 2.9*   CBC: Recent Labs  Lab 06/01/18 0314 06/02/18 0226 06/03/18 0251 06/04/18 0645 06/05/18 0219  WBC 4.5 4.8 4.8 5.3 4.3  HGB 10.0* 9.9* 10.6* 10.5* 10.1*  HCT 33.5* 33.4* 34.3* 34.5* 33.7*  MCV 102.1* 102.5* 102.4* 103.3* 102.1*  PLT 126* 139* 128* 148* 132*    Lab Results  Component Value Date   INR 1.91 06/05/2018   INR 1.80 06/04/2018   INR 1.59 06/03/2018   Studies/Results: No results found.  Medications: . sodium chloride    . heparin 900 Units/hr (06/05/18 0005)   . calcitRIOL  1.75 mcg Oral Q M,W,F-HD  . calcium acetate  667 mg Oral TID WC  . Chlorhexidine Gluconate Cloth  6 each  Topical Q0600  . cyanocobalamin  1,000 mcg Intramuscular Q30 days  . feeding supplement (PRO-STAT SUGAR FREE 64)  30 mL Oral BID  . levothyroxine  125 mcg Oral QAC breakfast  . midodrine  5 mg Oral Q M,W,F  . predniSONE  5 mg Oral Q breakfast  . sodium chloride flush  3 mL Intravenous Q12H  . Warfarin - Pharmacist Dosing Inpatient   Does not apply q1800    Dialysis Orders: [facility] MWF 4h20mn 400/800 44kg 4K/2.25 bath P4 Hep 4200 LUE AVG Mircera 1053m IV q 2 weeks (last 12/24) Calcitriol 1.75 mcg PO TIW  Assessment/Plan: 1. LUL PE - Heparin/warfarin per primary.  LUL subsegmental clot w/no other clots noted.  2. Dyspnea - multifactorial COPD/acute PE. Not vol overload; thought possibly vol overloaded but unable to lower volume at all here, severe intradialytic symptoms. Will keep prior dry wt of 44kg. 2. ESRD - on HD MWF. HD Monday if still here.  3. Anemia of CKD- Hgb 10.0>9.9.  Follow trends.  4. Secondary hyperparathyroidism - Ca in goal. Phos elevated. Ca acetate stopped earlier this month due to low phos. Start Phoslo 1AC TID.  Continue VDRA. 5. Hypotension - chronic, on midodrine.  Resume midodrine 5 tid 6.  Nutrition - Alb 2.4. Renal diet w/fluid restrictions.  Renal vitamins. Nepro.  7. Crohn's disease/chronic diarrhea 8. Hx COPD/Emphysema    Kelly Splinter MD [facility] pager [phone]   06/05/2018, 9:13 AM

## 2018-06-06 LAB — CBC
HCT: 33.7 % — ABNORMAL LOW (ref 36.0–46.0)
Hemoglobin: 10.5 g/dL — ABNORMAL LOW (ref 12.0–15.0)
MCH: 31.8 pg (ref 26.0–34.0)
MCHC: 31.2 g/dL (ref 30.0–36.0)
MCV: 102.1 fL — ABNORMAL HIGH (ref 80.0–100.0)
Platelets: 135 10*3/uL — ABNORMAL LOW (ref 150–400)
RBC: 3.3 MIL/uL — ABNORMAL LOW (ref 3.87–5.11)
RDW: 15.5 % (ref 11.5–15.5)
WBC: 4.8 10*3/uL (ref 4.0–10.5)
nRBC: 0 % (ref 0.0–0.2)

## 2018-06-06 LAB — RENAL FUNCTION PANEL
Albumin: 2.9 g/dL — ABNORMAL LOW (ref 3.5–5.0)
Anion gap: 16 — ABNORMAL HIGH (ref 5–15)
BUN: 45 mg/dL — ABNORMAL HIGH (ref 8–23)
CO2: 22 mmol/L (ref 22–32)
Calcium: 10.2 mg/dL (ref 8.9–10.3)
Chloride: 99 mmol/L (ref 98–111)
Creatinine, Ser: 7.47 mg/dL — ABNORMAL HIGH (ref 0.44–1.00)
GFR calc non Af Amer: 5 mL/min — ABNORMAL LOW (ref >60)
GFR, EST AFRICAN AMERICAN: 6 mL/min — AB (ref >60)
Glucose, Bld: 83 mg/dL (ref 70–99)
Phosphorus: 7.3 mg/dL — ABNORMAL HIGH (ref 2.5–4.6)
Potassium: 3.1 mmol/L — ABNORMAL LOW (ref 3.5–5.1)
Sodium: 137 mmol/L (ref 135–145)

## 2018-06-06 LAB — HEPARIN LEVEL (UNFRACTIONATED): Heparin Unfractionated: 0.44 IU/mL (ref 0.30–0.70)

## 2018-06-06 LAB — PROTIME-INR
INR: 2.36
Prothrombin Time: 25.4 seconds — ABNORMAL HIGH (ref 11.4–15.2)

## 2018-06-06 MED ORDER — CALCIUM ACETATE (PHOS BINDER) 667 MG PO CAPS: 667.0000 mg | ORAL_CAPSULE | Freq: Three times a day (TID) | ORAL | 0 refills | Status: DC

## 2018-06-06 MED ORDER — WARFARIN SODIUM 5 MG PO TABS: 5.0000 mg | ORAL_TABLET | Freq: Every day | ORAL | 0 refills | Status: DC

## 2018-06-06 MED ORDER — WARFARIN SODIUM 7.5 MG PO TABS: 7.5000 mg | ORAL_TABLET | Freq: Once | ORAL | Status: DC

## 2018-06-06 NOTE — Progress Notes (Signed)
ANTICOAGULATION CONSULT NOTE - Follow Up Consult  Pharmacy Consult for Heparin and Coumadin Indication: pulmonary embolus and chronic LLE DVT  Allergies  Allergen Reactions  . Mercaptopurine Other (See Comments)    Caused pancreatitis  . Remicade [Infliximab] Other (See Comments)    CAUSED JOINT PAIN    Patient Measurements: Height: 5' 4"  (162.6 cm) Weight: 95 lb 10.9 oz (43.4 kg) IBW/kg (Calculated) : 54.7 Heparin Dosing Weight: 42.6 kg  Vital Signs: Temp: 98.7 F (37.1 C) (01/12 0732) Temp Source: Oral (01/12 0732) BP: 102/59 (01/12 0732) Pulse Rate: 91 (01/12 0732)  Labs: Recent Labs    06/04/18 0319  06/04/18 0645 06/05/18 0219 06/06/18 0225 06/06/18 0537  HGB  --    < > 10.5* 10.1* 10.5*  --   HCT  --   --  34.5* 33.7* 33.7*  --   PLT  --   --  148* 132* 135*  --   LABPROT 20.7*  --   --  21.7* 25.4*  --   INR 1.80  --   --  1.91 2.36  --   HEPARINUNFRC 0.32  --   --  0.41 0.44  --   CREATININE  --   --  8.02* 4.91*  --  7.47*   < > = values in this interval not displayed.   Assessment:  73 yo female with h/o COPD, ESRD on HD admitted with complaints of SOB. CT evidence of PE found without heart strain. Venous duplex showed chronic left leg DVT.  Heparin level this morning remains therapeutic (HL 0.44, goal of 0.3-0.7). INR today is also theurapeutic (INR 2.36 << 1.91, goal of 2-3). CBC stable - no bleeding noted. Heparin bridge discontinued this morning with INR>2.   Noted plans for possible discharge today - would recommend prescribing the 5 mg tablets and asking the patient to take 7.5 mg this evening. Noted PCP follow-up appointment made for 1/13 AM.  Goal of Therapy:  INR 2-3 Monitor platelets by anticoagulation protocol: Yes   Plan:  - Hep bridge discontinued with INR>2 - Warfarin 7.5 mg x 1 dose at 1800 today (if still here) - Educated on warfarin this admit - Will continue to monitor for any signs/symptoms of bleeding and will follow up with  heparin level and PT/INR in the a.m. (if still here)  Thank you for allowing pharmacy to be a part of this patient's care.  Alycia Rossetti, PharmD, BCPS Clinical Pharmacist Pager: [phone] Clinical phone for 06/06/2018 from 7a-3:30p: [phone] If after 3:30p, please call main pharmacy at: [phone] Please check AMION for all [place] numbers 06/06/2018 11:30 AM

## 2018-06-07 NOTE — Discharge Summary (Addendum)
Physician Discharge Summary  Natasha [name] KVQ:[id number] DOB: August 04, 1945 DOA: 05/28/2018  PCP: [name], MD  Admit date: 05/28/2018 Discharge date: 06/06/2018  Time spent: 45 minutes  Recommendations for Outpatient Follow-up:  1. 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 with PMH of Crohn's disease, ESRD on HD, hypothyroidism, COPD; admitted on 05/28/2018, presented with complaint of shortness of breath, was found to have acute pulmonary embolism.  Hospital Course:   1.  Acute pulmonary embolism/Chronic left leg DVT -CT angiogram of the chest was positive for single left segmental pulmonary embolism and Dopplers noted chronic left leg DVT -Due to ESRD, not a candidate for DoAcs -She was started on Coumadin with heparin bridge, INR is finally therapeutic today  -She is advised to take 7.5 mg of Coumadin tonight, suspect her long-term dose of Coumadin will probably between be between 5 and 10 mg  -Heparin discontinued today she will be discharged home with close follow-up with her PCP, she has an appointment tomorrow and can get INR rechecked then again  2.  Acute on chronic respiratory failure -Multifactorial, has COPD on 2 L home O2 at baseline, in addition now with acute small PE and potentially small component of volume overload -Improved with treatment of PE as well as volume overload with HD -Improved, close to baseline at this time  3.  ESRD on hemodialysis Monday Wednesday  Friday -Stable  4. Hypokalemia -corrected with HD  5.  COPD, chronic respiratory failure on 2 LPM [place] -No acute exacerbation.  Continue home regimen.  On 2 L of oxygen.  5.Hypothyroidism -Continue Synthroid  6.Crohn's colitis -She is followed by GI, managed with Humira and daily low-dose prednisone 5 mg, currently monitor.  PRN Imodium.  7.  Severe protein calorie malnutrition /underweight  Body mass index is 16.42 kg/m.  Interventions: Prostat   Discharge Exam: Vitals:   06/05/18 2329 06/06/18 0732  BP: 120/65 (!) 102/59  Pulse: 93 91  Resp: 16   Temp: 98.5 F (36.9 C) 98.7 F (37.1 C)  SpO2: 100% 100%    General: Alert awake oriented x3 Cardiovascular: S1-S2/regular rate rhythm Respiratory: Improved air movement, no rales  Discharge Instructions   Discharge Instructions    Diet - low sodium heart healthy   Complete by:  As directed    Increase activity slowly   Complete by:  As directed      Allergies as of 06/06/2018      Reactions   Mercaptopurine Other (See Comments)   Caused pancreatitis   Remicade [infliximab] Other (See Comments)   CAUSED JOINT PAIN      Medication List    STOP taking these medications   dextromethorphan 30 MG/5ML liquid Commonly known as:  DELSYM   guaiFENesin 600 MG 12 hr tablet Commonly known as:  MUCINEX   sodium bicarbonate 650 MG tablet     TAKE these medications   acetaminophen 500 MG tablet Commonly known as:  TYLENOL Take 1,000 mg by mouth every 6 (six) hours as needed for mild pain.   albuterol  108 (90 Base) MCG/ACT inhaler Commonly known as:  PROVENTIL HFA;VENTOLIN HFA Inhale 2 puffs into the lungs every 6 (six) hours as needed for wheezing or shortness of breath.   alendronate 70 MG tablet Commonly known as:  FOSAMAX Take 70 mg by mouth every Monday.   calcitRIOL 0.25 MCG capsule Commonly known as:  ROCALTROL Take 0.25 mcg by mouth every Monday, Wednesday, and Friday with  hemodialysis.   calcium acetate 667 MG capsule Commonly known as:  PHOSLO Take 1 capsule (667 mg total) by mouth 3 (three) times daily with meals.   cyanocobalamin 1000 MCG/ML injection Commonly known as:  (VITAMIN B-12) Inject 1,000 mcg into the muscle every 30 (thirty) days.   HUMIRA 40 MG/0.8ML Pskt Generic drug:  Adalimumab Inject 40 mg into the skin every 14 (fourteen) days.   HYDROcodone-acetaminophen 5-325 MG tablet Commonly known as:  NORCO Take 1 tablet by mouth every 6 (six) hours as needed for moderate pain.   levothyroxine 125 MCG tablet Commonly known as:  SYNTHROID, LEVOTHROID Take 125 mcg by mouth daily before breakfast.   loperamide 2 MG capsule Commonly known as:  IMODIUM Take 2 capsules (4 mg total) by mouth as needed for diarrhea or loose stools. What changed:  when to take this   midodrine 5 MG tablet Commonly known as:  PROAMATINE Take 5 mg by mouth every Monday, Wednesday, and Friday. 5 mg PRIOR TO dialysis on Mon/Wed/Fri   nitroGLYCERIN 0.4 MG SL tablet Commonly known as:  NITROSTAT Place 1 tablet (0.4 mg total) under the tongue every 5 (five) minutes as needed for chest pain.   ondansetron 4 MG disintegrating tablet Commonly known as:  ZOFRAN ODT Take 1 tablet (4 mg total) by mouth every 8 (eight) hours as needed for nausea or vomiting.   OXYGEN Inhale 2-3 L into the lungs See admin instructions. Use every night and during the day as needed for shortness of breath   predniSONE 5 MG tablet Commonly known as:  DELTASONE Take 5 mg by mouth daily with breakfast.   TRELEGY ELLIPTA 100-62.5-25 MCG/INH Aepb Generic drug:  Fluticasone-Umeclidin-Vilant INHALE 1 PUFF BY MOUTH ONCE DAILY What changed:  See the new instructions.   warfarin 5 MG tablet Commonly known as:  COUMADIN Take 1-1.5 tablets (5-7.5 mg total) by mouth daily at 6 PM. Take 7.48m ( 1 and half tab tonight) and then 549mtomorrow depending on INR check at Blood draw      Allergies   Allergen Reactions  . Mercaptopurine Other (See Comments)    Caused pancreatitis  . Remicade [Infliximab] Other (See Comments)    CAUSED JOINT PAIN   Follow-up Information    VaLeeroy ChaMD Follow up on 06/07/2018.   Specialty:  Internal Medicine Why:  8 am for pt/inr check and hospital follow up Contact information: [address] [place] [phone]          The results of significant diagnostics from this hospitalization (including imaging, microbiology, ancillary and laboratory) are listed below for reference.    Significant Diagnostic Studies: Dg Chest 2 View  Result Date: 06/01/2018 CLINICAL DATA:  Short of breath, history of pulmonary embolism, history of COPD EXAM: CHEST - 2 VIEW COMPARISON:  CT chest of 05/28/2018, chest x-ray of the same date, and chest x-ray of 03/29/2017 FINDINGS: The lungs remain hyperaerated with flattened hemidiaphragms consistent with emphysematous change. No pneumonia or effusion is seen. Mediastinal and hilar contours are unremarkable. The heart  is stable in size being upper limits of normal. The bones are osteopenic. A left vascular stent overlies the left axilla. IMPRESSION: No active cardiopulmonary disease.  Emphysema. Electronically Signed   By: Ivar Drape M.D.   On: 06/01/2018 08:38   Dg Chest 2 View  Result Date: 05/28/2018 CLINICAL DATA:  Shortness of breath. EXAM: CHEST - 2 VIEW COMPARISON:  03/31/2018. FINDINGS: Mediastinum and hilar structures normal. Heart size normal. Diffuse mild bilateral from interstitial prominence. These findings are noted on multiple prior exams most likely related chronic interstitial lung disease. And active interstitial process including interstitial edema and/or pneumonitis could not be excluded. No pleural effusion or pneumothorax. No acute bony abnormality. IMPRESSION: Diffuse mild bilateral pulmonary interstitial prominence. These changes are most likely related  chronic interstitial lung disease. And active interstitial process including interstitial edema and/or pneumonitis could not be excluded. Electronically Signed   By: Marcello Moores  Register   On: 05/28/2018 14:00   Ct Angio Chest Pe W And/or Wo Contrast  Result Date: 05/28/2018 CLINICAL DATA:  Shortness of breath. EXAM: CT ANGIOGRAPHY CHEST WITH CONTRAST TECHNIQUE: Multidetector CT imaging of the chest was performed using the standard protocol during bolus administration of intravenous contrast. Multiplanar CT image reconstructions and MIPs were obtained to evaluate the vascular anatomy. CONTRAST:  100 mL ISOVUE-370 IOPAMIDOL (ISOVUE-370) INJECTION 76% COMPARISON:  Single-view of the chest 03/31/2018. PA and lateral chest today. FINDINGS: Cardiovascular: A single embolus is seen in a segmental branch in the left upper lobe. There is no evidence of right heart strain. No aortic aneurysm. Heart size is mildly enlarged. No pericardial effusion. Mediastinum/Nodes: No enlarged mediastinal, hilar, or axillary lymph nodes. The patient is status post thyroidectomy. Trachea and esophagus demonstrate no significant findings. Lungs/Pleura: The lungs demonstrate extensive emphysematous disease. No pulmonary infarct is identified. No consolidative process, pneumothorax or effusion. No mass. Upper Abdomen: Nonobstructing bilateral renal stones are seen. The patient is status post cholecystectomy. Musculoskeletal: No acute or focal abnormality. Review of the MIP images confirms the above findings. IMPRESSION: Examination is positive for pulmonary embolus. No evidence of right heart strain. Cardiomegaly. Nonobstructing renal stones. Critical Value/emergent results were called by telephone at the time of interpretation on 05/28/2018 at 4:29 pm to Dr. Ronnald Nian, who verbally acknowledged these results. Emphysema (ICD10-J43.9). Electronically Signed   By: Inge Rise M.D.   On: 05/28/2018 16:32   Vas [place] Lower Extremity Venous  (dvt)  Result Date: 05/29/2018  Lower Venous Study Risk Factors: Confirmed PE DVT Hx. Performing Technologist: June Leap RDMS, RVT  Examination Guidelines: A complete evaluation includes B-mode imaging, spectral Doppler, color Doppler, and power Doppler as needed of all accessible portions of each vessel. Bilateral testing is considered an integral part of a complete examination. Limited examinations for reoccurring indications may be performed as noted.  Right Venous Findings: +---------+---------------+---------+-----------+----------+-------+          CompressibilityPhasicitySpontaneityPropertiesSummary +---------+---------------+---------+-----------+----------+-------+ CFV      Full           Yes      Yes                          +---------+---------------+---------+-----------+----------+-------+ SFJ      Full                                                 +---------+---------------+---------+-----------+----------+-------+  FV Prox  Full                                                 +---------+---------------+---------+-----------+----------+-------+ FV Mid   Full                                                 +---------+---------------+---------+-----------+----------+-------+ FV DistalFull                                                 +---------+---------------+---------+-----------+----------+-------+ PFV      Full                                                 +---------+---------------+---------+-----------+----------+-------+ POP      Full           Yes      Yes                          +---------+---------------+---------+-----------+----------+-------+ PTV      Full                                                 +---------+---------------+---------+-----------+----------+-------+ PERO     Full                                                 +---------+---------------+---------+-----------+----------+-------+  Left Venous  Findings: +---------+---------------+---------+-----------+----------+-------+          CompressibilityPhasicitySpontaneityPropertiesSummary +---------+---------------+---------+-----------+----------+-------+ CFV      Full           Yes      Yes                          +---------+---------------+---------+-----------+----------+-------+ SFJ      Full                                                 +---------+---------------+---------+-----------+----------+-------+ FV Prox  Full                                                 +---------+---------------+---------+-----------+----------+-------+ FV Mid   Full                                                 +---------+---------------+---------+-----------+----------+-------+  FV DistalFull                                                 +---------+---------------+---------+-----------+----------+-------+ PFV      Full                                                 +---------+---------------+---------+-----------+----------+-------+ POP      Partial        Yes      Yes                  Chronic +---------+---------------+---------+-----------+----------+-------+ PTV      Full                                                 +---------+---------------+---------+-----------+----------+-------+ PERO     Full                                                 +---------+---------------+---------+-----------+----------+-------+    Summary: Right: There is no evidence of deep vein thrombosis in the lower extremity. No cystic structure found in the popliteal fossa. 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 No cystic structure found in the popliteal fossa.  *See table(s) above for measurements and observations. Electronically signed by Ruta Hinds MD on 05/29/2018 at 3:18:09 PM.    Final     Microbiology: Recent Results (from the past 240 hour(s))  MRSA  PCR Screening     Status: None   Collection Time: 05/29/18 12:40 AM  Result Value Ref Range Status   MRSA by PCR NEGATIVE NEGATIVE Final    Comment:        The GeneXpert MRSA Assay (FDA approved for NASAL specimens only), is one component of a comprehensive MRSA colonization surveillance program. It is not intended to diagnose MRSA infection nor to guide or monitor treatment for MRSA infections. Performed at [facility], [place] [address] [place], [place] [address]      Labs: Basic Metabolic Panel: Recent Labs  Lab 06/02/18 0226 06/03/18 0251 06/04/18 0645 06/05/18 0219 06/06/18 0537  NA 137 136 132* 135 137  K 3.2* 3.2* 3.2* 3.8 3.1*  CL 105 103 101 100 99  CO2 20* 23 17* 23 22  GLUCOSE 91 75 82 74 83  BUN 50* 29* 50* 25* 45*  CREATININE 8.24* 5.32* 8.02* 4.91* 7.47*  CALCIUM 8.9 8.9 9.3 9.7 10.2  PHOS 7.7* 5.3* 6.9* 6.9* 7.3*   Liver Function Tests: Recent Labs  Lab 06/02/18 0226 06/03/18 0251 06/04/18 0645 06/05/18 0219 06/06/18 0537  ALBUMIN 2.8* 2.7* 3.0* 2.9* 2.9*   No results for input(s): LIPASE, AMYLASE in the last 168 hours. No results for input(s): AMMONIA in the last 168 hours. CBC: Recent Labs  Lab 06/02/18 0226 06/03/18 0251 06/04/18 0645 06/05/18 0219 06/06/18 0225  WBC 4.8 4.8 5.3 4.3 4.8  HGB 9.9* 10.6* 10.5* 10.1* 10.5*  HCT 33.4* 34.3*  34.5* 33.7* 33.7*  MCV 102.5* 102.4* 103.3* 102.1* 102.1*  PLT 139* 128* 148* 132* 135*   Cardiac Enzymes: No results for input(s): CKTOTAL, CKMB, CKMBINDEX, TROPONINI in the last 168 hours. BNP: BNP (last 3 results) Recent Labs    05/28/18 1333  BNP 186.4*    ProBNP (last 3 results) No results for input(s): PROBNP in the last 8760 hours.  CBG: No results for input(s): GLUCAP in the last 168 hours.     Signed:  Domenic Polite MD.  [facility] 06/07/2018, 2:20 PM

## 2018-06-10 ENCOUNTER — Telehealth: Payer: Self-pay | Admitting: Hematology

## 2018-06-10 ENCOUNTER — Encounter: Payer: Self-pay | Admitting: Hematology

## 2018-06-10 NOTE — Telephone Encounter (Signed)
Received a new hem referral from Dr. Fara Olden for duration of anticoagulation. Pt has been cld and scheduled to see Dr. Irene Limbo on 2/6 at 10am. Pt aware to arrive 30 minutes early. Letter mailed.

## 2018-06-30 NOTE — Progress Notes (Signed)
HEMATOLOGY/ONCOLOGY CONSULTATION NOTE  Date of Service: 07/01/2018  Patient Care Team: [name], MD as PCP - General (Internal Medicine) Fleet Contras, MD as Consulting Physician (Nephrology) Center, [facility] [place] [facility] Estanislado Emms, MD as Consulting Physician (Nephrology)  CHIEF COMPLAINTS/PURPOSE OF CONSULTATION:  History of Pulmonary Embolism  HISTORY OF PRESENTING ILLNESS:   Natasha [name] is a wonderful 73 y.o. female who has been referred to [place] by Dr. [name] for evaluation and management of her history of Pulmonary embolism. She is accompanied today by her husband. The pt reports that she is doing well overall.   The pt had her first DVT in November 2015 in the left leg, and was seen to be chronic on repeated US in 2016, 2018, and 05/29/18. She was admitted from 05/28/18 to 06/05/18 after presenting with SOB and was subsequently evaluated with a CTA Chest which confirmed a PE. She has ESRD and was started on Coumadin with a heparin bridge. The pt notes that she was placed on 2.42m Eliquis BID 1 week ago after developing nose bleeds, at the time that she notes her coumadin levels were high at 7.1. She denies her nose bleeds being very bad. The pt is also 101 pounds.  The pt notes that she appeared to the hospital with SOB and COPD exacerbation. She also had a COPD exacerbation episode with hospitalization in November 2019. The pt uses oxygen at night and while at dialysis.  The pt reports that she fell after slipping on ice in 2015, and then developed her first blood clot, a DVT in the left leg. She notes that she wore an immobilizer for a few months before developing the DVT. The pt notes that her port for dialysis has clotted before as well. She has a graft in her left upper arm now.   The pt takes Huiara for her Crohn's disease. She denies blood in the stools but endorses frequent diarrhea. She notes that she moves her  bowels up to 7-8 times a day. She notes that she has fistulas as well.  Of note prior to the patient's visit today, pt has had a CTA Chest completed on 05/28/18 with results revealing A single embolus is seen in a segmental branch in the left upper lobe. There is no evidence of right heart strain.  Most recent lab results (06/06/18) of CBC is as follows: all values are WNL except for RBC at 3.30, HGB at 10.5, HCT at 33.7, MCV at 102.1, PLT at 135k.  On review of systems, pt reports diarrhea, frequent bowel movements, and denies blood in the stools, abdominal pains, chest pain, and any other symptoms.   On PMHx the pt reports Crohn's disease s/p 3 terminal ileal resections in 1978 1987 and 1989. Osteoporosis, HTN, glaucoma, ESRD, COPD, LLE DVT in November 2015, CHF, appendectomy, cholecystectomy, thyroidectomy. On Social Hx the pt reports that she quit smoking cigarettes about a year ago On Family Hx the pt reports brother with colon cancer before age 591 denies blood clots or blood disorders  MEDICAL HISTORY:  Past Medical History:  Diagnosis Date  . Anal fistula   . Anemia in chronic kidney disease (CKD)   . Aortic insufficiency    Echo 3/18: mod conc LVH, EF 60-65, no RWMA, Gr 1 DD, mod AI, mild MR, normal RVSF, Trivial TR  . Arthritis   . Borderline hypertension   . Bulging disc    L3-L4  . Chronic diarrhea  due to crohn's  . CKD (chronic kidney disease), stage IV ([place])    MWF- [address]  . Crohn's disease ([place])    chronic ileitis  . Dyspnea    with exertion  . Emphysema/COPD ([place])   . GERD (gastroesophageal reflux disease)    denies  . History of blood transfusion   . History of glaucoma   . History of kidney stones   . History of pancreatitis    2008--  mercaptopurine  . History of small bowel obstruction    12-03-2010  due to crohn's ileitis  . Hypertension    no longer on medications  . Hypothyroidism, postsurgical    multinodule w/ hurthle cells  . Osteoporosis    . Perianal Crohn's disease ([place])   . Peripheral vascular disease ([place])    blood clot behind knee left leg  . Polyarthralgia   . Seizures ([place])    03/2017  . Wears partial dentures    upper    SURGICAL HISTORY: Past Surgical History:  Procedure Laterality Date  . ABDOMINAL HYSTERECTOMY  1990   and  Appendectomy  . AV FISTULA PLACEMENT Left 12/08/2017   Procedure: INSERTION OF ARTERIOVENOUS (AV) GRAFT WITH ARTEGRAFT TO LEFT UPPER ARM;  Surgeon: Conrad Clay, MD;  Location: [place];  Service: Vascular;  Laterality: Left;  . BASCILIC VEIN TRANSPOSITION Left 01/09/2017   Procedure: LEFT 1ST STAGE BRACHIAL VEIN TRANSPOSITION;  Surgeon: Conrad St. James, MD;  Location: [place];  Service: Vascular;  Laterality: Left;  . BASCILIC VEIN TRANSPOSITION Left 06/16/2017   Procedure: Second Stage BASILIC VEIN TRANSPOSITION  LEFT ARM;  Surgeon: Conrad Tull, MD;  Location: [place];  Service: Vascular;  Laterality: Left;  . BIOPSY  03/09/2018   Procedure: BIOPSY;  Surgeon: Wilford Corner, MD;  [facility];  Service: Endoscopy;;  . CHOLECYSTECTOMY    . COLON RESECTION  x3 --  1978,  1987, 1989   ILEAL RESECTION x2/   [place]  . COLONOSCOPY WITH PROPOFOL N/A 09/25/2014   Procedure: COLONOSCOPY WITH PROPOFOL;  Surgeon: Garlan Fair, MD;  [facility];  Service: Endoscopy;  Laterality: N/A;  . COLONOSCOPY WITH PROPOFOL N/A 03/09/2018   Procedure: COLONOSCOPY WITH PROPOFOL;  Surgeon: Wilford Corner, MD;  [facility];  Service: Endoscopy;  Laterality: N/A;  . CYSTOSCOPY W/ URETERAL STENT PLACEMENT Right 11/19/2016   Procedure: CYSTOSCOPY WITH RIGHT RETROGRADE PYELOGRAM/ URETEROSCOPY WITH LASER AND RIGHT URETERAL STENT PLACEMENT;  Surgeon: Ardis Hughs, MD;  [facility];  Service: Urology;  Laterality: Right;  . ESOPHAGOGASTRODUODENOSCOPY  03/04/2012   Procedure: ESOPHAGOGASTRODUODENOSCOPY (EGD);  Surgeon: Garlan Fair, MD;  [facility];   Service: Endoscopy;  Laterality: N/A;  . EXAMINATION UNDER ANESTHESIA N/A 09/12/2014   Procedure: EXAM UNDER ANESTHESIA;  Surgeon: Rolm Bookbinder, MD;  Location: [place];  Service: General;  Laterality: N/A;  . EYE SURGERY     laser  . FLEXIBLE SIGMOIDOSCOPY  03/04/2012   Procedure: FLEXIBLE SIGMOIDOSCOPY;  Surgeon: Garlan Fair, MD;  [facility];  Service: Endoscopy;  Laterality: N/A;  . GLAUCOMA SURGERY Bilateral   . HOLMIUM LASER APPLICATION Right 12/11/5972   Procedure: HOLMIUM LASER APPLICATION;  Surgeon: Ardis Hughs, MD;  [facility];  Service: Urology;  Laterality: Right;  . INCISION AND DRAINAGE PERIRECTAL ABSCESS N/A 09/12/2014   Procedure: IRRIGATION AND DEBRIDEMENT PERIRECTAL ABSCESS;  Surgeon: Rolm Bookbinder, MD;  Location: [place];  Service: General;  Laterality: N/A;  . IR  FLUORO GUIDE CV LINE RIGHT  03/20/2017  . IR US GUIDE VASC ACCESS RIGHT  03/20/2017  . NEGATIVE SLEEP STUDY  2008  . PLACEMENT OF SETON N/A 12/01/2014   Procedure: PLACEMENT OF SETON;  Surgeon: [name], MD;  Location: [facility];  Service: General;  Laterality: N/A;  . POLYPECTOMY  03/09/2018   Procedure: POLYPECTOMY;  Surgeon: Wilford Corner, MD;  [facility];  Service: Endoscopy;;  . RECTAL EXAM UNDER ANESTHESIA N/A 12/01/2014   Procedure: RECTAL EXAM UNDER ANESTHESIA;  Surgeon: [name], MD;  Location[facility];  Service: General;  Laterality: N/A;  . TOTAL THYROIDECTOMY  10-13-2003  . TRANSTHORACIC ECHOCARDIOGRAM  07-11-2013   mild LVH,  ef 55%,  moderate AR,  mild MR and TR,  trivial PR  . TUBAL LIGATION      SOCIAL HISTORY: Social History   Socioeconomic History  . Marital status: Married    Spouse name: [name]  . Number of children: 3  . Years of education: 61  . Highest education level: Not on file  Occupational History  . Occupation: Retired    Fish farm manager: RETIRED  Social Needs  . Financial resource strain:  Not on file  . Food insecurity:    Worry: Not on file    Inability: Not on file  . Transportation needs:    Medical: Not on file    Non-medical: Not on file  Tobacco Use  . Smoking status: Former Smoker    Packs/day: 0.25    Years: 15.00    Pack years: 3.75    Types: Cigarettes    Last attempt to quit: 11/27/2001    Years since quitting: 16.6  . Smokeless tobacco: Never Used  Substance and Sexual Activity  . Alcohol use: No  . Drug use: No  . Sexual activity: Never  Lifestyle  . Physical activity:    Days per week: Not on file    Minutes per session: Not on file  . Stress: Not on file  Relationships  . Social connections:    Talks on phone: Not on file    Gets together: Not on file    Attends religious service: Not on file    Active member of club or organization: Not on file    Attends meetings of clubs or organizations: Not on file    Relationship status: Not on file  . Intimate partner violence:    Fear of current or ex partner: Not on file    Emotionally abused: Not on file    Physically abused: Not on file    Forced sexual activity: Not on file  Other Topics Concern  . Not on file  Social History Narrative   Patient is married [name]) and lives at home with her husband.   Patient has three children.   Patient is retired on disability.   Patient drinks two cups of caffeine daily.   Patient is right-handed.          FAMILY HISTORY: Family History  Problem Relation Age of Onset  . Hypertension Father   . Heart disease Father   . Heart attack Father   . Diverticulitis Mother   . COPD Mother   . Hypertension Mother   . Heart disease Mother   . Heart attack Mother   . Heart attack Brother   . Colon cancer Brother   . Diabetes Brother   . Thyroid disease Brother     ALLERGIES:  is allergic to mercaptopurine  and remicade [infliximab].  MEDICATIONS:  Current Outpatient Medications  Medication Sig Dispense Refill  . acetaminophen (TYLENOL) 500 MG  tablet Take 1,000 mg by mouth every 6 (six) hours as needed for mild pain.     . Adalimumab (HUMIRA) 40 MG/0.8ML PSKT Inject 40 mg into the skin every 14 (fourteen) days.     [name] albuterol (PROVENTIL HFA;VENTOLIN HFA) 108 (90 Base) MCG/ACT inhaler Inhale 2 puffs into the lungs every 6 (six) hours as needed for wheezing or shortness of breath. 1 Inhaler 5  . alendronate (FOSAMAX) 70 MG tablet Take 70 mg by mouth every Monday.   1  . calcitRIOL (ROCALTROL) 0.25 MCG capsule Take 0.25 mcg by mouth every Monday, Wednesday, and Friday with hemodialysis.  0  . calcium acetate (PHOSLO) 667 MG capsule Take 1 capsule (667 mg total) by mouth 3 (three) times daily with meals. 90 capsule 0  . cyanocobalamin (,VITAMIN B-12,) 1000 MCG/ML injection Inject 1,000 mcg into the muscle every 30 (thirty) days.     [name] HYDROcodone-acetaminophen (NORCO) 5-325 MG tablet Take 1 tablet by mouth every 6 (six) hours as needed for moderate pain. 15 tablet 0  . levothyroxine (SYNTHROID, LEVOTHROID) 125 MCG tablet Take 125 mcg by mouth daily before breakfast.     . loperamide (IMODIUM) 2 MG capsule Take 2 capsules (4 mg total) by mouth as needed for diarrhea or loose stools. (Patient taking differently: Take 4 mg by mouth every Monday, Wednesday, and Friday. ) 30 capsule 0  . midodrine (PROAMATINE) 5 MG tablet Take 5 mg by mouth every Monday, Wednesday, and Friday. 5 mg PRIOR TO dialysis on Mon/Wed/Fri  0  . nitroGLYCERIN (NITROSTAT) 0.4 MG SL tablet Place 1 tablet (0.4 mg total) under the tongue every 5 (five) minutes as needed for chest pain. (Patient not taking: Reported on 05/28/2018) 25 tablet 3  . ondansetron (ZOFRAN ODT) 4 MG disintegrating tablet Take 1 tablet (4 mg total) by mouth every 8 (eight) hours as needed for nausea or vomiting. 20 tablet 0  . OXYGEN Inhale 2-3 L into the lungs See admin instructions. Use every night and during the day as needed for shortness of breath    . predniSONE (DELTASONE) 5 MG tablet Take 5 mg by  mouth daily with breakfast.    . TRELEGY ELLIPTA 100-62.5-25 MCG/INH AEPB INHALE 1 PUFF BY MOUTH ONCE DAILY (Patient taking differently: Inhale 1 puff into the lungs daily. ) 60 each 2  . warfarin (COUMADIN) 5 MG tablet Take 1-1.5 tablets (5-7.5 mg total) by mouth daily at 6 PM. Take 7.56m ( 1 and half tab tonight) and then 578mtomorrow depending on INR check at Blood draw 30 tablet 0   No current facility-administered medications for this visit.     REVIEW OF SYSTEMS:    10 Point review of Systems was done is negative except as noted above.  PHYSICAL EXAMINATION:  . Vitals:   07/01/18 1008  BP: (!) 104/56  Pulse: 89  Resp: 17  Temp: 98 F (36.7 C)  SpO2: 98%   Filed Weights   07/01/18 1008  Weight: 101 lb 11.2 oz (46.1 kg)   .Body mass index is 17.46 kg/m.  GENERAL:alert, in no acute distress and comfortable SKIN: no acute rashes, no significant lesions EYES: conjunctiva are pink and non-injected, sclera anicteric OROPHARYNX: MMM, no exudates, no oropharyngeal erythema or ulceration NECK: supple, no JVD LYMPH:  no palpable lymphadenopathy in the cervical, axillary or inguinal regions LUNGS: clear to auscultation b/l  with normal respiratory effort HEART: regular rate & rhythm ABDOMEN:  normoactive bowel sounds , non tender, not distended. Extremity: no pedal edema PSYCH: alert & oriented x 3 with fluent speech NEURO: no focal motor/sensory deficits  LABORATORY DATA:  I have reviewed the data as listed  . CBC Latest Ref Rng & Units 06/06/2018 06/05/2018 06/04/2018  WBC 4.0 - 10.5 K/uL 4.8 4.3 5.3  Hemoglobin 12.0 - 15.0 g/dL 10.5(L) 10.1(L) 10.5(L)  Hematocrit 36.0 - 46.0 % 33.7(L) 33.7(L) 34.5(L)  Platelets 150 - 400 K/uL 135(L) 132(L) 148(L)    . CMP Latest Ref Rng & Units 06/06/2018 06/05/2018 06/04/2018  Glucose 70 - 99 mg/dL 83 74 82  BUN 8 - 23 mg/dL 45(H) 25(H) 50(H)  Creatinine 0.44 - 1.00 mg/dL 7.47(H) 4.91(H) 8.02(H)  Sodium 135 - 145 mmol/L 137 135  132(L)  Potassium 3.5 - 5.1 mmol/L 3.1(L) 3.8 3.2(L)  Chloride 98 - 111 mmol/L 99 100 101  CO2 22 - 32 mmol/L 22 23 17(L)  Calcium 8.9 - 10.3 mg/dL 10.2 9.7 9.3  Total Protein 6.5 - 8.1 g/dL - - -  Total Bilirubin 0.3 - 1.2 mg/dL - - -  Alkaline Phos 38 - 126 U/L - - -  AST 15 - 41 U/L - - -  ALT 0 - 44 U/L - - -     RADIOGRAPHIC STUDIES: I have personally reviewed the radiological images as listed and agreed with the findings in the report. No results found.  ASSESSMENT & PLAN:  73 y.o. female with  1. History of PE and DVT PLAN -Discussed patient's most recent labs from 06/06/18, WBC normal at 4.8k, HGB at 10.5 in setting of ESRD, PLT at 135k -Discussed the 05/28/18 CTA Chest which revealed A single embolus is seen in a segmental branch in the left upper lobe. There is no evidence of right heart strain. -Discussed the 05/29/18 Venous US BLE which revealed Right: There is no evidence of deep vein thrombosis in the lower extremity. No cystic structure found in the popliteal fossa. 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 No cystic structure found in the popliteal fossa -Reviewed the 04/06/14 Vas [place] BLE which revealed Findings consistent with deep vein thrombosis involving the left femoral vein, left popliteal vein, left posterial tibial vein, and left peroneal vein. -Discussed that her most recent PE is explainable from her chronic left leg DVT, concurrent hospitalization for COPD exacerbation, and active Crohn's disease -Discussed that her original DVT in 2015 was likely provoked by her left leg immobilization and injury after slipping on ice -Discussed that Crohn's can independently increase the risk of blood clots due to the associated chronic inflammation -The pt denies familial history of blood clots, and did not have a blood clot until she was 68 after years of smoking as well, which she has since quit. Do not have high suspicion of  inheritable or acquired clotting disorder. -Discussed that there are not clear guidelines regarding Eliqius in the setting of end stage renal disease, and there is a risk of having a higher functional dose than she needs, without an adequate mechanism of direct drug reversal if she develops bleeding. -Would recommend Coumadin instead because it is measurable and reversible, in the setting of her ESRD, and the pt weighting less than 50kg -Discussed that in view of the above mentioned details, I do not recommend life-long blood thinners -Recommend completing blood thinners, preferably Coumadin, for 3-6 months followde by ASA lifelong -  Will see the pt back as needed   F/u with PCP -- no indication for continued hematology f/u   All of the patients questions were answered with apparent satisfaction. The patient knows to call the clinic with any problems, questions or concerns.  The total time spent in the appt was 45 minutes and more than 50% was on counseling and direct patient cares.    Sullivan Lone MD MS AAHIVMS [facility] Hematology/Oncology Physician [facility]  (Office):       [phone] (Work cell):  [phone] (Fax):           [phone]  07/01/2018 10:59 AM  I, Baldwin [place], am acting as a scribe for Dr. Sullivan Lone.   .I have reviewed the above documentation for accuracy and completeness, and I agree with the above. Brunetta Genera MD

## 2018-07-01 ENCOUNTER — Inpatient Hospital Stay: Payer: Medicare Other | Attending: Hematology | Admitting: Hematology

## 2018-07-01 ENCOUNTER — Telehealth: Payer: Self-pay | Admitting: Hematology

## 2018-07-01 VITALS — BP 104/56 | HR 89 | Temp 98.0°F | Resp 17 | Ht 64.0 in | Wt 101.7 lb

## 2018-07-01 DIAGNOSIS — I10 Essential (primary) hypertension: Secondary | ICD-10-CM | POA: Insufficient documentation

## 2018-07-01 DIAGNOSIS — J441 Chronic obstructive pulmonary disease with (acute) exacerbation: Secondary | ICD-10-CM | POA: Insufficient documentation

## 2018-07-01 DIAGNOSIS — Z7901 Long term (current) use of anticoagulants: Secondary | ICD-10-CM | POA: Insufficient documentation

## 2018-07-01 DIAGNOSIS — Z86711 Personal history of pulmonary embolism: Secondary | ICD-10-CM | POA: Insufficient documentation

## 2018-07-01 DIAGNOSIS — K5090 Crohn's disease, unspecified, without complications: Secondary | ICD-10-CM | POA: Diagnosis not present

## 2018-07-01 DIAGNOSIS — N186 End stage renal disease: Secondary | ICD-10-CM | POA: Diagnosis not present

## 2018-07-01 DIAGNOSIS — Z992 Dependence on renal dialysis: Secondary | ICD-10-CM | POA: Insufficient documentation

## 2018-07-01 DIAGNOSIS — Z86718 Personal history of other venous thrombosis and embolism: Secondary | ICD-10-CM | POA: Diagnosis not present

## 2018-07-01 DIAGNOSIS — I2699 Other pulmonary embolism without acute cor pulmonale: Secondary | ICD-10-CM

## 2018-07-01 DIAGNOSIS — D631 Anemia in chronic kidney disease: Secondary | ICD-10-CM | POA: Diagnosis not present

## 2018-07-01 DIAGNOSIS — Z79899 Other long term (current) drug therapy: Secondary | ICD-10-CM | POA: Diagnosis not present

## 2018-07-01 DIAGNOSIS — Z9981 Dependence on supplemental oxygen: Secondary | ICD-10-CM | POA: Insufficient documentation

## 2018-07-01 DIAGNOSIS — Z87891 Personal history of nicotine dependence: Secondary | ICD-10-CM | POA: Insufficient documentation

## 2018-07-01 NOTE — Telephone Encounter (Signed)
Per 02/06 los F/u with PCP -- no indication for continued hematology f/u.  Printed avs per patient request.

## 2018-08-03 ENCOUNTER — Ambulatory Visit: Payer: Medicare Other | Admitting: Pulmonary Disease

## 2018-08-16 ENCOUNTER — Other Ambulatory Visit: Payer: Self-pay | Admitting: Pulmonary Disease

## 2018-08-17 ENCOUNTER — Ambulatory Visit: Payer: Medicare Other | Admitting: Pulmonary Disease

## 2018-08-26 ENCOUNTER — Other Ambulatory Visit: Payer: Self-pay | Admitting: Pulmonary Disease

## 2018-08-31 ENCOUNTER — Other Ambulatory Visit: Payer: Self-pay

## 2018-08-31 ENCOUNTER — Encounter: Payer: Self-pay | Admitting: Nurse Practitioner

## 2018-08-31 ENCOUNTER — Ambulatory Visit (INDEPENDENT_AMBULATORY_CARE_PROVIDER_SITE_OTHER): Payer: Medicare Other | Admitting: Nurse Practitioner

## 2018-08-31 ENCOUNTER — Ambulatory Visit: Payer: Medicare Other | Admitting: Pulmonary Disease

## 2018-08-31 DIAGNOSIS — J439 Emphysema, unspecified: Secondary | ICD-10-CM

## 2018-08-31 DIAGNOSIS — J441 Chronic obstructive pulmonary disease with (acute) exacerbation: Secondary | ICD-10-CM | POA: Diagnosis not present

## 2018-08-31 NOTE — Assessment & Plan Note (Signed)
Discussion: Patient was admitted to the hospital in January for COPD exacerbation.  CT angiogram in the hospital showed left pulmonary embolism and Dopplers noted chronic left leg DVT patient was started on Coumadin.  She is still currently on Coumadin.  Patient continues on 2 L of oxygen continuous.  Compliant with Trelegy and albuterol as needed.  She is requesting a portable oxygen tank to use for travel back and forth from doctors visits. Denies f/c/s, n/v/d, hemoptysis, PND, leg swelling.  Patient does get regular hemodialysis Monday, Wednesday, Friday schedule.  Patient has been stable since hospital discharge.  Patient Instructions   Severe COPD with emphysema. - continue trelegy and prn albuterol  Pulmonary embolism Continue coumadin as directed  Chronic respiratory failure with hypoxia. - continue 2 liters oxygen with exertion and sleep for now - will arrange for pulmonary rehab -will order POC - patient uses [employer] for DME  Crohn's colitis. - on chronic prednisone and humira - followed at [facility]  Stage IV CKD. - followed by [facility]  Patient Instructions  Will arrange for overnight oxygen test  Follow up in 4 months with Dr. Halford Chessman  Or sooner if needed

## 2018-08-31 NOTE — Patient Instructions (Signed)
Severe COPD with emphysema. - continue trelegy and prn albuterol  Pulmonary embolism Continue coumadin as directed  Chronic respiratory failure with hypoxia. - continue 2 liters oxygen with exertion and sleep for now - will arrange for pulmonary rehab -will order POC - patient uses [employer] for DME  Crohn's colitis. - on chronic prednisone and humira - followed at [facility]  Stage IV CKD. - followed by [facility]  Patient Instructions  Will arrange for overnight oxygen test  Follow up in 4 months with Dr. Halford [name]  Or sooner if needed

## 2018-08-31 NOTE — Assessment & Plan Note (Signed)
Continue coumadin as directed

## 2018-08-31 NOTE — Progress Notes (Signed)
Virtual Visit via Telephone Note  I connected with Natasha [name] on 08/31/18 at 10:30 AM EDT by telephone and verified that I am speaking with the correct person using two identifiers.   I discussed the limitations, risks, security and privacy concerns of performing an evaluation and management service by telephone and the availability of in person appointments. I also discussed with the patient that there may be a patient responsible charge related to this service. The patient expressed understanding and agreed to proceed.   History of Present Illness: 73 y.o. female former smoker with COPD/Emphysema.  Patient is a tele-visit today for follow-up.  She was last seen by Dr. Halford Chessman on 04/27/2018.  She was admitted to the hospital in January for COPD exacerbation.  CT angiogram in the hospital showed left pulmonary embolism and Dopplers noted chronic left leg DVT patient was started on Coumadin.  She is still currently on Coumadin.  Patient continues on 2 L of oxygen continuous.  Compliant with Trelegy and albuterol as needed.  She is requesting a portable oxygen tank to use for travel back and forth from doctors visits. Denies f/c/s, n/v/d, hemoptysis, PND, leg swelling.  Patient does get regular hemodialysis Monday, Wednesday, Friday schedule.  Patient has been stable since hospital discharge.   Observations/Objective:  CTA 05/28/18 - Examination is positive for pulmonary embolus. No evidence of right heart strain. Emphysema  CXR 06/01/18 - No active cardiopulmonary disease.  Emphysema.  PFT 08/08/13 >> FEV1 1.00 (52%), FEV1% 50, TLC 3.97 (78%), DLCO 20% Ambulatory oximetry with RA 04/27/18 >> SpO2 80%  HST 06/26/17 >> AHI 0.5, SaO2 low 86%.  Assessment and Plan: Discussion: Patient was admitted to the hospital in January for COPD exacerbation.  CT angiogram in the hospital showed left pulmonary embolism and Dopplers noted chronic left leg DVT patient was started on Coumadin.  She is still  currently on Coumadin.  Patient continues on 2 L of oxygen continuous.  Compliant with Trelegy and albuterol as needed.  She is requesting a portable oxygen tank to use for travel back and forth from doctors visits. Denies f/c/s, n/v/d, hemoptysis, PND, leg swelling.  Patient does get regular hemodialysis Monday, Wednesday, Friday schedule.  Patient has been stable since hospital discharge.  Patient Instructions   Severe COPD with emphysema. - continue trelegy and prn albuterol  Pulmonary embolism Continue coumadin as directed  Chronic respiratory failure with hypoxia. - continue 2 liters oxygen with exertion and sleep for now - will arrange for pulmonary rehab -will order POC - patient uses [employer] for DME (discussed with patient that she would need qualifying walk at next visit for POC)  Crohn's colitis. - on chronic prednisone and humira - followed at [facility]  Stage IV CKD. - followed by [facility]  Patient Instructions  Will arrange for overnight oxygen test   Follow Up Instructions:  Follow up in 4 months with Dr. Halford Chessman  Or sooner if needed    I discussed the assessment and treatment plan with the patient. The patient was provided an opportunity to ask questions and all were answered. The patient agreed with the plan and demonstrated an understanding of the instructions.   The patient was advised to call back or seek an in-person evaluation if the symptoms worsen or if the condition fails to improve as anticipated.  I provided 22 minutes of non-face-to-face time during this encounter.   Fenton Foy, NP

## 2018-09-15 ENCOUNTER — Telehealth (HOSPITAL_COMMUNITY): Payer: Self-pay | Admitting: Cardiac Rehabilitation

## 2018-09-15 NOTE — Telephone Encounter (Signed)
Phone call to patient to discuss outpatient cardiac rehab.  Pt is interested in participating once program reopens from [place] 19 precautions.  Pt is eager to learn lifestyle modifications to reduce risk factors. Pt exercise at home limited by HD and high pollen air quality. Pt reports she is walking frequently within her home. Pt mailed phase I educational packet for reinforcement. Pt is following Covid prevention measures. Pt offered emotional support and reassurance. Pt expressed appreciation for the call. Andi Hence, RN, BSN Cardiac Pulmonary Rehab

## 2018-09-21 ENCOUNTER — Telehealth (HOSPITAL_COMMUNITY): Payer: Self-pay | Admitting: *Deleted

## 2018-09-21 ENCOUNTER — Telehealth: Payer: Self-pay | Admitting: Nurse Practitioner

## 2018-09-21 ENCOUNTER — Other Ambulatory Visit: Payer: Self-pay | Admitting: General Surgery

## 2018-09-21 DIAGNOSIS — J449 Chronic obstructive pulmonary disease, unspecified: Secondary | ICD-10-CM

## 2018-09-21 NOTE — Telephone Encounter (Signed)
Checked note from televisit on 08/31/18 and the order for the POC was not placed at that time as the patient would need to have a qualifying walk to determine the correct settings for the POC.   Called the patient and she stated that she would be willing to come in for qualifying walk so POC can be ordered. Patient scheduled for Thursday 09/23/18 at 10:30. Patient stated that she already has a mask and gloves because she has to go to dialysis. Patient denied any fever, cough, chills.   Patient stated she was seen by her PCP today and had to take O2 tank with her and it was heavy for her to drag behind her. Advised the patient that her husband stay in the car if possible for the visit since we are trying to limit exposure for patients due to [place]. Patient voiced understanding. Nothing further needed at this time.

## 2018-09-21 NOTE — Telephone Encounter (Signed)
LMTCB. Per AVS place order for POC 08/31/2018. No order noted in epic. Order placed.

## 2018-09-21 NOTE — Telephone Encounter (Signed)
Unable to leave message on phone or cell for interest in participating in pulmonary rehab. Will send please contact letter. Pt referred to Pulmonary rehab by [name] NP with Dr. Halford Chessman with diagnosis of COPD and Pulmonary Embolism. Review of referral and office visit.  Will need additional information in order to process this referral.  1.  Need [place] to cosign this referral placed by NP for medicare reimbursement.  2.  PFT completion (last PFT was in 2015) Will need more recent PFT when using the  diagnosis of COPD for staging.   PFT ordered on 4/7 but unable to complete due to Covid-19. Could use Pulmonary embolism for diagnosis instead of COPD if unable to forsee getting PFT completed in timely manner. 3. Name of nephrology and clinic she receives dialysis. Will need clearance for her to participate in Pulmonary rehab. Cherre Huger, BSN Cardiac and Training and development officer

## 2018-09-23 ENCOUNTER — Encounter: Payer: Self-pay | Admitting: Nurse Practitioner

## 2018-09-23 ENCOUNTER — Other Ambulatory Visit: Payer: Self-pay

## 2018-09-23 ENCOUNTER — Ambulatory Visit (INDEPENDENT_AMBULATORY_CARE_PROVIDER_SITE_OTHER): Payer: Medicare Other | Admitting: Nurse Practitioner

## 2018-09-23 VITALS — BP 116/70 | HR 98 | Ht 64.0 in | Wt 106.4 lb

## 2018-09-23 DIAGNOSIS — J439 Emphysema, unspecified: Secondary | ICD-10-CM | POA: Diagnosis not present

## 2018-09-23 DIAGNOSIS — J432 Centrilobular emphysema: Secondary | ICD-10-CM | POA: Diagnosis not present

## 2018-09-23 DIAGNOSIS — I2699 Other pulmonary embolism without acute cor pulmonale: Secondary | ICD-10-CM | POA: Diagnosis not present

## 2018-09-23 DIAGNOSIS — J9611 Chronic respiratory failure with hypoxia: Secondary | ICD-10-CM

## 2018-09-23 MED ORDER — FLUTICASONE-UMECLIDIN-VILANT 100-62.5-25 MCG/INH IN AEPB: 1.0000 | INHALATION_SPRAY | Freq: Every day | RESPIRATORY_TRACT | 0 refills | Status: DC

## 2018-09-23 NOTE — Assessment & Plan Note (Signed)
Stable. -continue trelegy and prn albuterol

## 2018-09-23 NOTE — Patient Instructions (Addendum)
Severe COPD with emphysema. -continue trelegy and prn albuterol  Pulmonary embolism Continue Eliquis as directed - patient is followed by hematology   Chronic respiratory failure with hypoxia. - continue 2 liters oxygen with exertion and sleep for now -will order POC - patient uses [employer] for DME  Note: Patient walked in office today - O2 dropped to 87% on RA and then Sats came up to 85% when placed on 2 L [place].   Crohn's colitis. -on chronic prednisone and humira - followed at [facility]  Stage IV CKD. - followed by [facility]  Follow up with Dr. Halford Chessman in 4 months or sooner if needed

## 2018-09-23 NOTE — Assessment & Plan Note (Signed)
Continue Eliquis as directed - patient is followed by hematology

## 2018-09-23 NOTE — Assessment & Plan Note (Addendum)
-   continue 2 liters oxygen with exertion and sleep for now -will order POC - patient uses [employer] for DME  Note: Patient walked in office today - O2 dropped to 87% on RA and then Sats came up to 95% when placed on 2 L [place].   Patient Instructions  Severe COPD with emphysema. -continue trelegy and prn albuterol  Pulmonary embolism Continue Eliquis as directed - patient is followed by hematology   Chronic respiratory failure with hypoxia. - continue 2 liters oxygen with exertion and sleep for now -will order POC - patient uses [employer] for DME  Note: Patient walked in office today - O2 dropped to 87% on RA and then Sats came up to 85% when placed on 2 L [place].   Crohn's colitis. -on chronic prednisone and humira - followed at [facility]  Stage IV CKD. - followed by [facility]  Follow up with Dr. Halford Chessman in 4 months or sooner if needed

## 2018-09-23 NOTE — Progress Notes (Addendum)
@Patient  ID: Natasha [name], female    DOB: 12/11/1945, 73 y.o.   MRN: [id number]  Chief Complaint  Patient presents with  . Follow-up    patient needs qualifying walk for POC    Referring provider: [name],*  HPI  73 year old female former smoker with COPD/emphysema who is followed by Dr. Halford Chessman.  Tests:  CTA 05/28/18 - Examination is positive for pulmonary embolus. No evidence of right heart strain. Emphysema  CXR 06/01/18 - No active cardiopulmonary disease. Emphysema.  PFT 08/08/13 >> FEV1 1.00 (52%), FEV1% 50, TLC 3.97 (78%), DLCO 20% Ambulatory oximetry with RA 04/27/18 >> SpO2 80%  HST 06/26/17 >> AHI 0.5, SaO2 low 86%.   OV 09/23/18 - Office visit for qualifying walk for POC Patient presents today to have a qualifying walk for POC.  Patient is currently on 2 L of oxygen continuous.  She was recently diagnosed with left PE and left DVT and was started on Coumadin in December 2019.  She is compliant with Trelegy and albuterol as needed.  She needs a portable oxygen tank to travel back and forth from doctors visits and dialysis.  Patient does get regular hemodialysis on Monday, Wednesday, and Friday schedule.  She has been stable. Denies f/c/s, n/v/d, hemoptysis, PND, leg swelling.      Allergies  Allergen Reactions  . Mercaptopurine Other (See Comments)    Caused pancreatitis  . Remicade [Infliximab] Other (See Comments)    CAUSED JOINT PAIN    Immunization History  Administered Date(s) Administered  . Influenza Split 02/28/2016, 02/23/2017  . Pneumococcal Conjugate-13 03/26/2017    Past Medical History:  Diagnosis Date  . Anal fistula   . Anemia in chronic kidney disease (CKD)   . Aortic insufficiency    Echo 3/18: mod conc LVH, EF 60-65, no RWMA, Gr 1 DD, mod AI, mild MR, normal RVSF, Trivial TR  . Arthritis   . Borderline hypertension   . Bulging disc    L3-L4  . Chronic diarrhea    due to crohn's  . CKD (chronic kidney disease),  stage IV ([place])    MWF- [address]  . Crohn's disease ([place])    chronic ileitis  . Dyspnea    with exertion  . Emphysema/COPD ([place])   . GERD (gastroesophageal reflux disease)    denies  . History of blood transfusion   . History of glaucoma   . History of kidney stones   . History of pancreatitis    2008--  mercaptopurine  . History of small bowel obstruction    12-03-2010  due to crohn's ileitis  . Hypertension    no longer on medications  . Hypothyroidism, postsurgical    multinodule w/ hurthle cells  . Osteoporosis   . Perianal Crohn's disease ([place])   . Peripheral vascular disease ([place])    blood clot behind knee left leg  . Polyarthralgia   . Seizures ([place])    03/2017  . Wears partial dentures    upper    Tobacco History: Social History   Tobacco Use  Smoking Status Former Smoker  . Packs/day: 0.25  . Years: 15.00  . Pack years: 3.75  . Types: Cigarettes  . Last attempt to quit: 11/27/2001  . Years since quitting: 16.8  Smokeless Tobacco Never Used   Counseling given: Not Answered   Outpatient Encounter Medications as of 09/23/2018  Medication Sig  . acetaminophen (TYLENOL) 500 MG tablet Take 1,000 mg by mouth every 6 (six) hours as needed  for mild pain.   . Adalimumab (HUMIRA) 40 MG/0.8ML PSKT Inject 40 mg into the skin every 14 (fourteen) days.   [name] alendronate (FOSAMAX) 70 MG tablet Take 70 mg by mouth every Monday.   . calcitRIOL (ROCALTROL) 0.25 MCG capsule Take 0.25 mcg by mouth every Monday, Wednesday, and Friday with hemodialysis.  [name] calcium acetate (PHOSLO) 667 MG capsule Take 1 capsule (667 mg total) by mouth 3 (three) times daily with meals.  . cyanocobalamin (,VITAMIN B-12,) 1000 MCG/ML injection Inject 1,000 mcg into the muscle every 30 (thirty) days.   [name] ELIQUIS 5 MG TABS tablet TK SS T BID UTD .  [name] HYDROcodone-acetaminophen (NORCO) 5-325 MG tablet Take 1 tablet by mouth every 6 (six) hours as needed for moderate pain.  [name] levothyroxine (SYNTHROID,  LEVOTHROID) 125 MCG tablet Take 125 mcg by mouth daily before breakfast.   . loperamide (IMODIUM) 2 MG capsule Take 2 capsules (4 mg total) by mouth as needed for diarrhea or loose stools. (Patient taking differently: Take 4 mg by mouth every Monday, Wednesday, and Friday. )  . midodrine (PROAMATINE) 5 MG tablet Take 5 mg by mouth every Monday, Wednesday, and Friday. 5 mg PRIOR TO dialysis on Mon/Wed/Fri  . nitroGLYCERIN (NITROSTAT) 0.4 MG SL tablet Place 1 tablet (0.4 mg total) under the tongue every 5 (five) minutes as needed for chest pain.  [name] ondansetron (ZOFRAN ODT) 4 MG disintegrating tablet Take 1 tablet (4 mg total) by mouth every 8 (eight) hours as needed for nausea or vomiting.  . OXYGEN Inhale 2-3 L into the lungs See admin instructions. Use every night and during the day as needed for shortness of breath  . predniSONE (DELTASONE) 5 MG tablet Take 5 mg by mouth daily with breakfast.  . TRELEGY ELLIPTA 100-62.5-25 MCG/INH AEPB INHALE 1 PUFF BY MOUTH EVERY DAY  . [DISCONTINUED] albuterol (PROVENTIL HFA;VENTOLIN HFA) 108 (90 Base) MCG/ACT inhaler INHALE 2 PUFFS BY MOUTH EVERY 6 HOURS IF NEEDED FOR WHEEZING OR SHORTNESS OF BREATH  . Fluticasone-Umeclidin-Vilant (TRELEGY ELLIPTA) 100-62.5-25 MCG/INH AEPB Inhale 1 puff into the lungs daily.  . [DISCONTINUED] warfarin (COUMADIN) 5 MG tablet Take 1-1.5 tablets (5-7.5 mg total) by mouth daily at 6 PM. Take 7.36m ( 1 and half tab tonight) and then 518mtomorrow depending on INR check at Blood draw   No facility-administered encounter medications on file as of 09/23/2018.      Review of Systems  Review of Systems  Constitutional: Negative.  Negative for chills and fever.  HENT: Negative.   Respiratory: Positive for shortness of breath. Negative for cough and wheezing.   Cardiovascular: Negative.  Negative for chest pain, palpitations and leg swelling.  Gastrointestinal: Negative.   Allergic/Immunologic: Negative.   Neurological: Negative.    Psychiatric/Behavioral: Negative.        Physical Exam  BP 116/70 (BP Location: Right Arm, Patient Position: Sitting, Cuff Size: Normal)   Pulse 98   Ht 5' 4"  (1.626 m)   Wt 106 lb 6.4 oz (48.3 kg)   SpO2 99%   BMI 18.26 kg/m   Wt Readings from Last 5 Encounters:  09/23/18 106 lb 6.4 oz (48.3 kg)  07/01/18 101 lb 11.2 oz (46.1 kg)  06/04/18 95 lb 10.9 oz (43.4 kg)  04/27/18 104 lb (47.2 kg)  04/02/18 96 lb 5.5 oz (43.7 kg)     Physical Exam Vitals signs and nursing note reviewed.  Constitutional:      General: She is not in acute distress.  Appearance: She is well-developed.  Cardiovascular:     Rate and Rhythm: Normal rate and regular rhythm.  Pulmonary:     Effort: Pulmonary effort is normal. No respiratory distress.     Breath sounds: Normal breath sounds. No wheezing or rhonchi.  Musculoskeletal:        General: No swelling.  Neurological:     Mental Status: She is alert and oriented to person, place, and time.       Assessment & Plan:   Emphysema/COPD ([place]) Stable. -continue trelegy and prn albuterol  Pulmonary embolism (H[facility]) Continue Eliquis as directed - patient is followed by hematology   Chronic respiratory failure with hypoxia ([place]) - continue 2 liters oxygen with exertion and sleep for now -will order POC - patient uses [employer] for DME  Note: Patient walked in office today - O2 dropped to 87% on RA and then Sats came up to 95% when placed on 2 L [place].   Patient Instructions  Severe COPD with emphysema. -continue trelegy and prn albuterol  Pulmonary embolism Continue Eliquis as directed - patient is followed by hematology   Chronic respiratory failure with hypoxia. - continue 2 liters oxygen with exertion and sleep for now -will order POC - patient uses [employer] for DME  Note: Patient walked in office today - O2 dropped to 87% on RA and then Sats came up to 85% when placed on 2 L .   Crohn's colitis. -on chronic prednisone and  humira - followed at [facility]  Stage IV CKD. - followed by [facility]  Follow up with Dr. Halford Chessman in 4 months or sooner if needed         Fenton Foy, NP 10/11/2018

## 2018-10-11 ENCOUNTER — Other Ambulatory Visit: Payer: Self-pay | Admitting: *Deleted

## 2018-10-11 ENCOUNTER — Telehealth: Payer: Self-pay | Admitting: Pulmonary Disease

## 2018-10-11 MED ORDER — ALBUTEROL SULFATE HFA 108 (90 BASE) MCG/ACT IN AERS: INHALATION_SPRAY | RESPIRATORY_TRACT | 5 refills | Status: DC

## 2018-10-11 MED ORDER — PREDNISONE 10 MG PO TABS: ORAL_TABLET | ORAL | 0 refills | Status: DC

## 2018-10-11 NOTE — Telephone Encounter (Signed)
Attempted to call back.  No answer and voicemail full.  See notes by TN below.

## 2018-10-11 NOTE — Telephone Encounter (Signed)
Pt called office stating that a refill was supposed to be sent to pharmacy for her albuterol inhaler but pharmacy did not receive it. I stated to   Primary Pulmonologist: VS Last office visit and with whom: 09/23/2018 with TN What do we see them for (pulmonary problems): emphysema Last OV assessment/plan: Instructions  Return in about 1 month (around 10/23/2018) for follow up Dr. Halford Chessman.  Severe COPD with emphysema. -continue trelegy and prn albuterol  Pulmonary embolism Continue Eliquis as directed - patient is followed by hematology   Chronic respiratory failure with hypoxia. - continue 2 liters oxygen with exertion and sleep for now -will order POC - patient uses [employer] for DME  Note: Patient walked in office today - O2 dropped to 87% on RA and then Sats came up to 85% when placed on 2 L [place].   Crohn's colitis. -on chronic prednisone and humira - followed at [facility]  Stage IV CKD. - followed by [facility]  Follow up with Dr. Halford Chessman in 4 months or sooner if needed       Was appointment offered to patient (explain)?  Pt wants recommendations   Reason for call: call transferred to me from [place] on pt.  While speaking with pt, pt stated she began having worsening breathing yesterday afternoon, 5/17 but stated today her breathing was a lot worse today.  Pt states she does wear O2 at 2L 24/7 but stated there have been times that she has had to increase the O2 to 3L.  Pt stated when she went to dialysis today, her BP was very elevated and was 211/106 when BP was checked at dialysis. Pt stated usually when her BP is checked at dialysis, they check it with her standing up but this time they had to check it with her sitting down due to her breathing. Pt stated the BP eventually came down to 130/60. Pt stated twice since she has been doing dialysis previously, they have had to call EMS due to her breathing.  Pt stated that the staff at the dialysis clinic have  wanted to call EMS today but pt stated she wanted to wait and see if breathing would calm down some. Pt stated her O2 sats had dropped to 87% on her 2L O2 and stated that usually her sats are in the 90s.  Pt stated she currently feels like she is struggling to get a breath. Pt is wanting to know recommendations. Tonya, please advise on this for pt. Thanks!

## 2018-10-11 NOTE — Telephone Encounter (Signed)
Returned call to patient.  Patient informed of T. Nils Pyle, NP instructions. Patient acknowledged understanding and will pick up prednisone rx today.  She also will continue to monitor Spo2 at home and adjust oxygen to 3L as needed to maintain 90% or above.  Patient had her saturation monitor checked by [employer] for accuracy today and states readings were correct.  She understands if condition worsening or unable to maintain O2 saturations 90% or above to go to ED.  Televisit scheduled for 10/21/18 per her request on dates.  Nothing further needed.

## 2018-10-11 NOTE — Telephone Encounter (Signed)
I will order a prednisone taper for her. She can increase O2 to 3L this afternoon as needed to keep O2 sats above 90%. If sats continue to drop please go to the ED. Please follow up with tele-visit in 1 week or sooner if needed. Thanks.

## 2018-10-13 ENCOUNTER — Emergency Department (HOSPITAL_COMMUNITY)
Admission: EM | Admit: 2018-10-13 | Discharge: 2018-10-13 | Disposition: A | Discharge status: Home / Self Care | Payer: Medicare Other | Attending: Emergency Medicine | Admitting: Emergency Medicine

## 2018-10-13 ENCOUNTER — Other Ambulatory Visit: Payer: Self-pay

## 2018-10-13 ENCOUNTER — Encounter (HOSPITAL_COMMUNITY): Payer: Self-pay | Admitting: Emergency Medicine

## 2018-10-13 ENCOUNTER — Emergency Department (HOSPITAL_COMMUNITY): Payer: Medicare Other

## 2018-10-13 DIAGNOSIS — N186 End stage renal disease: Secondary | ICD-10-CM | POA: Insufficient documentation

## 2018-10-13 DIAGNOSIS — Z79899 Other long term (current) drug therapy: Secondary | ICD-10-CM | POA: Insufficient documentation

## 2018-10-13 DIAGNOSIS — R0902 Hypoxemia: Secondary | ICD-10-CM | POA: Insufficient documentation

## 2018-10-13 DIAGNOSIS — Z7901 Long term (current) use of anticoagulants: Secondary | ICD-10-CM | POA: Diagnosis not present

## 2018-10-13 DIAGNOSIS — Z03818 Encounter for observation for suspected exposure to other biological agents ruled out: Secondary | ICD-10-CM | POA: Diagnosis not present

## 2018-10-13 DIAGNOSIS — R0602 Shortness of breath: Secondary | ICD-10-CM | POA: Diagnosis present

## 2018-10-13 DIAGNOSIS — Z87891 Personal history of nicotine dependence: Secondary | ICD-10-CM | POA: Insufficient documentation

## 2018-10-13 DIAGNOSIS — E039 Hypothyroidism, unspecified: Secondary | ICD-10-CM | POA: Insufficient documentation

## 2018-10-13 DIAGNOSIS — I132 Hypertensive heart and chronic kidney disease with heart failure and with stage 5 chronic kidney disease, or end stage renal disease: Secondary | ICD-10-CM | POA: Insufficient documentation

## 2018-10-13 DIAGNOSIS — Z992 Dependence on renal dialysis: Secondary | ICD-10-CM | POA: Diagnosis not present

## 2018-10-13 DIAGNOSIS — J441 Chronic obstructive pulmonary disease with (acute) exacerbation: Secondary | ICD-10-CM | POA: Diagnosis not present

## 2018-10-13 DIAGNOSIS — I5032 Chronic diastolic (congestive) heart failure: Secondary | ICD-10-CM | POA: Insufficient documentation

## 2018-10-13 LAB — COMPREHENSIVE METABOLIC PANEL
ALT: 15 U/L (ref 0–44)
AST: 20 U/L (ref 15–41)
Albumin: 3.4 g/dL — ABNORMAL LOW (ref 3.5–5.0)
Alkaline Phosphatase: 51 U/L (ref 38–126)
Anion gap: 13 (ref 5–15)
BUN: 47 mg/dL — ABNORMAL HIGH (ref 8–23)
CO2: 23 mmol/L (ref 22–32)
Calcium: 9.7 mg/dL (ref 8.9–10.3)
Chloride: 103 mmol/L (ref 98–111)
Creatinine, Ser: 6.6 mg/dL — ABNORMAL HIGH (ref 0.44–1.00)
GFR calc Af Amer: 7 mL/min — ABNORMAL LOW (ref >60)
GFR calc non Af Amer: 6 mL/min — ABNORMAL LOW (ref >60)
Glucose, Bld: 75 mg/dL (ref 70–99)
Potassium: 3.4 mmol/L — ABNORMAL LOW (ref 3.5–5.1)
Sodium: 139 mmol/L (ref 135–145)
Total Bilirubin: 0.6 mg/dL (ref 0.3–1.2)
Total Protein: 6.7 g/dL (ref 6.5–8.1)

## 2018-10-13 LAB — SARS CORONAVIRUS 2 BY RT PCR (HOSPITAL ORDER, PERFORMED IN ~~LOC~~ HOSPITAL LAB): SARS Coronavirus 2: NEGATIVE

## 2018-10-13 LAB — CBC WITH DIFFERENTIAL/PLATELET
Abs Immature Granulocytes: 0.02 10*3/uL (ref 0.00–0.07)
Basophils Absolute: 0 10*3/uL (ref 0.0–0.1)
Basophils Relative: 0 %
Eosinophils Absolute: 0.1 10*3/uL (ref 0.0–0.5)
Eosinophils Relative: 1 %
HCT: 36.5 % (ref 36.0–46.0)
Hemoglobin: 11.1 g/dL — ABNORMAL LOW (ref 12.0–15.0)
Immature Granulocytes: 0 %
Lymphocytes Relative: 17 %
Lymphs Abs: 1 10*3/uL (ref 0.7–4.0)
MCH: 31.4 pg (ref 26.0–34.0)
MCHC: 30.4 g/dL (ref 30.0–36.0)
MCV: 103.4 fL — ABNORMAL HIGH (ref 80.0–100.0)
Monocytes Absolute: 0.4 10*3/uL (ref 0.1–1.0)
Monocytes Relative: 7 %
Neutro Abs: 4.4 10*3/uL (ref 1.7–7.7)
Neutrophils Relative %: 75 %
Platelets: 125 10*3/uL — ABNORMAL LOW (ref 150–400)
RBC: 3.53 MIL/uL — ABNORMAL LOW (ref 3.87–5.11)
RDW: 17 % — ABNORMAL HIGH (ref 11.5–15.5)
WBC: 6 10*3/uL (ref 4.0–10.5)
nRBC: 0 % (ref 0.0–0.2)

## 2018-10-13 LAB — PROTIME-INR
INR: 1.1 (ref 0.8–1.2)
Prothrombin Time: 13.7 seconds (ref 11.4–15.2)

## 2018-10-13 MED ORDER — IPRATROPIUM-ALBUTEROL 0.5-2.5 (3) MG/3ML IN SOLN
3.0000 mL | Freq: Once | RESPIRATORY_TRACT | Status: AC
  Administered 2018-10-13: 3 mL via RESPIRATORY_TRACT
  Filled 2018-10-13: qty 3

## 2018-10-13 MED ORDER — IOHEXOL 350 MG/ML SOLN
75.0000 mL | Freq: Once | INTRAVENOUS | Status: AC | PRN
  Administered 2018-10-13: 15:00:00 75 mL via INTRAVENOUS

## 2018-10-13 NOTE — ED Triage Notes (Addendum)
Per [facility]: Shortness of breath started at 9 am today. Was supposed to go to dialysis today, had full tx on Monday. Struggling to breathe, used her trilogy and albuterol inhalers with no relief. 83% on 2 L [place], increased to 4 L [place] and got her up to 90%. Pt was more comfortable on 8 L simple face mask, stayed around 99%. Denies pain. Similar episode on Monday, was started on prednisone  40 Monday, 40 Tuesday, 20 today. Prednisone. No cough. No fever.  L side restricted for dialysis. When she was here in January "they found a spot on her lung". CT from January shows that she had a PE.

## 2018-10-13 NOTE — ED Provider Notes (Signed)
Patient feels like her dyspnea has improved only with oxygen. This is being weaned down, is at 4L and 100%. Feels well. Wanted to try albuterol and feels better with a DuoNeb.  Her O2 has been weaned down and now is back to her baseline 2 L and 100% oxygen.  She describes a steady decline for a month or more.  I think this might be just worsening of her overall lung disease rather than an acute bronchitis, pneumonia.  Her CT is reassuring with no acute findings.  I do not think she would necessarily benefit from admission and she agrees and would like to go home.  Will discharge home with return precautions.  She is currently on a steroid taper.    Results for orders placed or performed during the hospital encounter of 10/13/18  SARS Coronavirus 2 (CEPHEID- Performed in [place] hospital lab), [facility] Order  Result Value Ref Range   SARS Coronavirus 2 NEGATIVE NEGATIVE  Comprehensive metabolic panel  Result Value Ref Range   Sodium 139 135 - 145 mmol/L   Potassium 3.4 (L) 3.5 - 5.1 mmol/L   Chloride 103 98 - 111 mmol/L   CO2 23 22 - 32 mmol/L   Glucose, Bld 75 70 - 99 mg/dL   BUN 47 (H) 8 - 23 mg/dL   Creatinine, Ser 6.60 (H) 0.44 - 1.00 mg/dL   Calcium 9.7 8.9 - 10.3 mg/dL   Total Protein 6.7 6.5 - 8.1 g/dL   Albumin 3.4 (L) 3.5 - 5.0 g/dL   AST 20 15 - 41 U/L   ALT 15 0 - 44 U/L   Alkaline Phosphatase 51 38 - 126 U/L   Total Bilirubin 0.6 0.3 - 1.2 mg/dL   GFR calc non Af Amer 6 (L) >60 mL/min   GFR calc Af Amer 7 (L) >60 mL/min   Anion gap 13 5 - 15  CBC with Differential  Result Value Ref Range   WBC 6.0 4.0 - 10.5 K/uL   RBC 3.53 (L) 3.87 - 5.11 MIL/uL   Hemoglobin 11.1 (L) 12.0 - 15.0 g/dL   HCT 36.5 36.0 - 46.0 %   MCV 103.4 (H) 80.0 - 100.0 fL   MCH 31.4 26.0 - 34.0 pg   MCHC 30.4 30.0 - 36.0 g/dL   RDW 17.0 (H) 11.5 - 15.5 %   Platelets 125 (L) 150 - 400 K/uL   nRBC 0.0 0.0 - 0.2 %   Neutrophils Relative % 75 %   Neutro Abs 4.4 1.7 - 7.7 K/uL   Lymphocytes Relative  17 %   Lymphs Abs 1.0 0.7 - 4.0 K/uL   Monocytes Relative 7 %   Monocytes Absolute 0.4 0.1 - 1.0 K/uL   Eosinophils Relative 1 %   Eosinophils Absolute 0.1 0.0 - 0.5 K/uL   Basophils Relative 0 %   Basophils Absolute 0.0 0.0 - 0.1 K/uL   Immature Granulocytes 0 %   Abs Immature Granulocytes 0.02 0.00 - 0.07 K/uL  Protime-INR  Result Value Ref Range   Prothrombin Time 13.7 11.4 - 15.2 seconds   INR 1.1 0.8 - 1.2   Ct Angio Chest Pe W And/or Wo Contrast  Result Date: 10/13/2018 CLINICAL DATA:  Shortness of breath EXAM: CT ANGIOGRAPHY CHEST WITH CONTRAST TECHNIQUE: Multidetector CT imaging of the chest was performed using the standard protocol during bolus administration of intravenous contrast. Multiplanar CT image reconstructions and MIPs were obtained to evaluate the vascular anatomy. CONTRAST:  19m OMNIPAQUE IOHEXOL 350 MG/ML SOLN  COMPARISON:  CT angiogram chest May 28, 2018; chest radiograph Oct 13, 2018; earlier chest CT April 17, 2016. FINDINGS: Cardiovascular: There is no demonstrable pulmonary embolus. There is no thoracic aortic aneurysm or dissection. There are occasional foci of aortic atherosclerosis. Visualized great vessels appear unremarkable. There is no pericardial effusion or pericardial thickening. There is prominence of the main pulmonary outflow tract, measuring 3.4 cm in diameter. There is a stent in the left axillary region. Mediastinum/Nodes: Patient is status post thyroidectomy. There is no appreciable thoracic adenopathy. No esophageal lesions are demonstrable. Lungs/Pleura: There is underlying centrilobular and paraseptal emphysematous change. There is scarring and atelectatic change in the lung bases. There is mild lower lobe bronchiectatic change. There is a stable semi-solid opacity in the anterior segment of the left upper lobe measuring 7 x 4 mm. This opacity is best seen on axial slice 66 series 6. No pleural effusion or pleural thickening evident. Upper  Abdomen: There is reflux of contrast into the inferior vena cava and hepatic veins. There is upper abdominal aortic atherosclerosis. There is an adenoma in the left adrenal measuring 1.9 x 1.5 cm. Musculoskeletal: There are no blastic or lytic bone lesions. No chest wall lesions are evident. Review of the MIP images confirms the above findings. IMPRESSION: 1. No demonstrable pulmonary embolus. No thoracic aortic aneurysm or dissection. There is aortic atherosclerosis. 2. There is prominence of the main pulmonary outflow tract, a finding indicative of a degree of pulmonary arterial hypertension. 3. Underlying emphysematous change with areas of scarring and atelectasis. No consolidation. Stable semi-solid opacity in the anterior segment of the left upper lobe measuring 7 x 4 mm. Given stability since the 2017 study, this lesion is felt to be most certainly of benign etiology. No pleural effusion evident. 4.  No evident thoracic adenopathy. 5. Reflux of contrast into the inferior vena cava and hepatic veins may be indicative of a degree of increased right heart pressure. 6.  Benign left adrenal adenoma. 7.  Status post thyroidectomy. Aortic Atherosclerosis (ICD10-I70.0) and Emphysema (ICD10-J43.9). Electronically Signed   By: Lowella Grip III M.D.   On: 10/13/2018 15:50   Dg Chest Portable 1 View  Result Date: 10/13/2018 CLINICAL DATA:  Shortness of breath today. EXAM: PORTABLE CHEST 1 VIEW COMPARISON:  Two-view chest x-ray 06/01/2018 FINDINGS: Heart size upper limits of normal. Chronic interstitial coarsening is again seen. There is no superimposed edema or effusion. No focal airspace disease is present. IMPRESSION: 1. Stable chronic interstitial disease. 2. No acute cardiopulmonary disease. Electronically Signed   By: San Morelle M.D.   On: 10/13/2018 12:45      Sherwood Gambler, MD 10/13/18 [phone]

## 2018-10-13 NOTE — ED Provider Notes (Signed)
[facility] EMERGENCY DEPARTMENT Provider Note   CSN: [id number] Arrival date & time: 10/13/18  1039    History   Chief Complaint Chief Complaint  Patient presents with  . Shortness of Breath    HPI Natasha [name] is a 73 y.o. female.     HPI Patient with shortness of breath.  Worsened today but is had over the last week or 2.  Had been on steroids by pulmonology.  Today got more short of breath.  Has chronic oxygen.  Use her inhalers without relief.  Sats were 83% on her normal 2 L.  No chest pain.  No real cough.  No swelling in her legs.  She is a dialysis patient and was supposed to go in for dialysis today but was hypoxic so she was brought here.  Reportedly had a full dialysis on Monday although her blood pressure was initially high.  5 months ago diagnosed with DVT and pulmonary embolism.  Had been on Coumadin but recently switched to Eliquis. Past Medical History:  Diagnosis Date  . Anal fistula   . Anemia in chronic kidney disease (CKD)   . Aortic insufficiency    Echo 3/18: mod conc LVH, EF 60-65, no RWMA, Gr 1 DD, mod AI, mild MR, normal RVSF, Trivial TR  . Arthritis   . Borderline hypertension   . Bulging disc    L3-L4  . Chronic diarrhea    due to crohn's  . CKD (chronic kidney disease), stage IV ([place])    MWF- [address]  . Crohn's disease ([place])    chronic ileitis  . Dyspnea    with exertion  . Emphysema/COPD ([place])   . GERD (gastroesophageal reflux disease)    denies  . History of blood transfusion   . History of glaucoma   . History of kidney stones   . History of pancreatitis    2008--  mercaptopurine  . History of small bowel obstruction    12-03-2010  due to crohn's ileitis  . Hypertension    no longer on medications  . Hypothyroidism, postsurgical    multinodule w/ hurthle cells  . Osteoporosis   . Perianal Crohn's disease ([place])   . Peripheral vascular disease ([place])    blood clot behind knee left leg  . Polyarthralgia    . Seizures ([place])    03/2017  . Wears partial dentures    upper    Patient Active Problem List   Diagnosis Date Noted  . Chronic respiratory failure with hypoxia ([place]) 09/23/2018  . Pulmonary embolism ([place]) 05/28/2018  . COPD with acute exacerbation ([place]) 03/31/2018  . Acute sinusitis 03/31/2018  . Crohn's disease of colon with complication ([place])   . Aortic insufficiency 07/30/2016  . Atherosclerosis of aorta ([place]) 07/30/2016  . Pressure injury of skin 07/14/2016  . Gastroenteritis, acute 07/12/2016  . COPD (chronic obstructive pulmonary disease) ([place]) 10/12/2015  . Malnutrition of moderate degree ([place]) 09/13/2014  . history of Left leg DVT 09/10/2014  . Hypotension 09/10/2014  . Hypoglycemia 09/10/2014  . Ischiorectal abscess 09/09/2014  . Increased anion gap metabolic acidosis   . Generalized abdominal pain   . Chronic diastolic CHF (congestive heart failure) ([place])   . Hypertensive heart disease   . Secondary hyperparathyroidism ([place])   . Other specified hypothyroidism   . Right shoulder pain   . Thrombocytopenia ([place])   . Severe protein-calorie malnutrition ([place])   . Thyroid activity decreased   . Emphysema of lung ([place])   .  Nephrolithiasis 03/29/2014  . Spinal stenosis of lumbar region 11/01/2013  . Bilateral leg pain 09/28/2013  . Emphysema/COPD ([place])   . Acute dyspnea 09/20/2013  . Protein-calorie malnutrition, severe ([place]) 05/14/2013  . Salmonella enteritidis 06/11/2012  . Hypocalcemia 06/10/2012  . Hypothyroidism 06/10/2012  . Anemia of chronic disease 06/09/2012  . Hypomagnesemia 06/09/2012  . ESRD on dialysis ([place]) 06/09/2012  . Dehydration 06/07/2012  . Crohn's disease without complication ([place]) 90/24/0973  . Metabolic acidosis 53/29/9242  . Hypokalemia 06/07/2012  . Nausea vomiting and diarrhea 06/07/2012  . UTI (lower urinary tract infection) 06/07/2012    Past Surgical History:  Procedure Laterality Date  . ABDOMINAL HYSTERECTOMY  1990   and   Appendectomy  . AV FISTULA PLACEMENT Left 12/08/2017   Procedure: INSERTION OF ARTERIOVENOUS (AV) GRAFT WITH ARTEGRAFT TO LEFT UPPER ARM;  Surgeon: Conrad Sterling, MD;  Location: [facility];  Service: Vascular;  Laterality: Left;  . BASCILIC VEIN TRANSPOSITION Left 01/09/2017   Procedure: LEFT 1ST STAGE BRACHIAL VEIN TRANSPOSITION;  Surgeon: Conrad Carbon, MD;  Location: [facility];  Service: Vascular;  Laterality: Left;  . BASCILIC VEIN TRANSPOSITION Left 06/16/2017   Procedure: Second Stage BASILIC VEIN TRANSPOSITION  LEFT ARM;  Surgeon: Conrad Watchung, MD;  Location: [facility];  Service: Vascular;  Laterality: Left;  . BIOPSY  03/09/2018   Procedure: BIOPSY;  Surgeon: Wilford Corner, MD;  Location: [facility];  Service: Endoscopy;;  . CHOLECYSTECTOMY    . COLON RESECTION  x3 --  1978,  1987, 1989   ILEAL RESECTION x2/   [place]  . COLONOSCOPY WITH PROPOFOL N/A 09/25/2014   Procedure: COLONOSCOPY WITH PROPOFOL;  Surgeon: Garlan Fair, MD;  [facility];  Service: Endoscopy;  Laterality: N/A;  . COLONOSCOPY WITH PROPOFOL N/A 03/09/2018   Procedure: COLONOSCOPY WITH PROPOFOL;  Surgeon: Wilford Corner, MD;  [facility];  Service: Endoscopy;  Laterality: N/A;  . CYSTOSCOPY W/ URETERAL STENT PLACEMENT Right 11/19/2016   Procedure: CYSTOSCOPY WITH RIGHT RETROGRADE PYELOGRAM/ URETEROSCOPY WITH LASER AND RIGHT URETERAL STENT PLACEMENT;  Surgeon: Ardis Hughs, MD;  [facility];  Service: Urology;  Laterality: Right;  . ESOPHAGOGASTRODUODENOSCOPY  03/04/2012   Procedure: ESOPHAGOGASTRODUODENOSCOPY (EGD);  Surgeon: Garlan Fair, MD;  [facility];  Service: Endoscopy;  Laterality: N/A;  . EXAMINATION UNDER ANESTHESIA N/A 09/12/2014   Procedure: EXAM UNDER ANESTHESIA;  Surgeon: Rolm Bookbinder, MD;  Location: [facility];  Service: General;  Laterality: N/A;  . EYE SURGERY     laser  . FLEXIBLE SIGMOIDOSCOPY  03/04/2012   Procedure: FLEXIBLE SIGMOIDOSCOPY;   Surgeon: Garlan Fair, MD;  [facility];  Service: Endoscopy;  Laterality: N/A;  . GLAUCOMA SURGERY Bilateral   . HOLMIUM LASER APPLICATION Right 6/83/4196   Procedure: HOLMIUM LASER APPLICATION;  Surgeon: Ardis Hughs, MD;  [facility];  Service: Urology;  Laterality: Right;  . INCISION AND DRAINAGE PERIRECTAL ABSCESS N/A 09/12/2014   Procedure: IRRIGATION AND DEBRIDEMENT PERIRECTAL ABSCESS;  Surgeon: Rolm Bookbinder, MD;  Location: [facility];  Service: General;  Laterality: N/A;  . IR FLUORO GUIDE CV LINE RIGHT  03/20/2017  . IR US GUIDE VASC ACCESS RIGHT  03/20/2017  . NEGATIVE SLEEP STUDY  2008  . PLACEMENT OF SETON N/A 12/01/2014   Procedure: PLACEMENT OF SETON;  Surgeon: [name], MD;  Location: [facility];  Service: General;  Laterality: N/A;  . POLYPECTOMY  03/09/2018   Procedure: POLYPECTOMY;  Surgeon: Wilford Corner, MD;  [facility];  Service: Endoscopy;;  . RECTAL EXAM UNDER ANESTHESIA N/A 12/01/2014   Procedure: RECTAL EXAM UNDER ANESTHESIA;  Surgeon: [name], MD;  Location: [facility];  Service: General;  Laterality: N/A;  . TOTAL THYROIDECTOMY  10-13-2003  . TRANSTHORACIC ECHOCARDIOGRAM  07-11-2013   mild LVH,  ef 55%,  moderate AR,  mild MR and TR,  trivial PR  . TUBAL LIGATION       OB History   No obstetric history on file.      Home Medications    Prior to Admission medications   Medication Sig Start Date End Date Taking? Authorizing Provider  acetaminophen (TYLENOL) 500 MG tablet Take 1,000 mg by mouth every 6 (six) hours as needed for mild pain.     [provider]  Adalimumab (HUMIRA) 40 MG/0.8ML PSKT Inject 40 mg into the skin every 14 (fourteen) days.     [provider]  albuterol (VENTOLIN HFA) 108 (90 Base) MCG/ACT inhaler INHALE 2 PUFFS BY MOUTH EVERY 6 HOURS IF NEEDED FOR WHEEZING OR SHORTNESS OF BREATH 10/11/18   Chesley Mires, MD  alendronate (FOSAMAX) 70 MG  tablet Take 70 mg by mouth every Monday.  11/06/16   [provider]  calcitRIOL (ROCALTROL) 0.25 MCG capsule Take 0.25 mcg by mouth every Monday, Wednesday, and Friday with hemodialysis. 02/03/17   [provider]  calcium acetate (PHOSLO) 667 MG capsule Take 1 capsule (667 mg total) by mouth 3 (three) times daily with meals. 06/06/18   Domenic Polite, MD  cyanocobalamin (,VITAMIN B-12,) 1000 MCG/ML injection Inject 1,000 mcg into the muscle every 30 (thirty) days.     [provider]  ELIQUIS 5 MG TABS tablet TK SS T BID UTD . 08/23/18   [provider]  Fluticasone-Umeclidin-Vilant (TRELEGY ELLIPTA) 100-62.5-25 MCG/INH AEPB Inhale 1 puff into the lungs daily. 09/23/18   Fenton Foy, NP  HYDROcodone-acetaminophen (NORCO) 5-325 MG tablet Take 1 tablet by mouth every 6 (six) hours as needed for moderate pain. 12/08/17   Dagoberto Ligas, PA-C  levothyroxine (SYNTHROID, LEVOTHROID) 125 MCG tablet Take 125 mcg by mouth daily before breakfast.     [provider]  loperamide (IMODIUM) 2 MG capsule Take 2 capsules (4 mg total) by mouth as needed for diarrhea or loose stools. Patient taking differently: Take 4 mg by mouth every Monday, Wednesday, and Friday.  03/27/17   Caren Griffins, MD  midodrine (PROAMATINE) 5 MG tablet Take 5 mg by mouth every Monday, Wednesday, and Friday. 5 mg PRIOR TO dialysis on Mon/Wed/Fri 04/30/17   [provider]  nitroGLYCERIN (NITROSTAT) 0.4 MG SL tablet Place 1 tablet (0.4 mg total) under the tongue every 5 (five) minutes as needed for chest pain. 08/19/16 03/31/26  Richardson Dopp T, PA-C  ondansetron (ZOFRAN ODT) 4 MG disintegrating tablet Take 1 tablet (4 mg total) by mouth every 8 (eight) hours as needed for nausea or vomiting. 03/29/18   Deno Etienne, DO  OXYGEN Inhale 2-3 L into the lungs See admin instructions. Use every night and during the day as needed for shortness of breath    [provider]  predniSONE  (DELTASONE) 10 MG tablet Take 4 tabs for 2 days, then 3 tabs for 2 days, then 2 tabs for 2 days, then 1 tab for 2 days, then stop 10/11/18   Fenton Foy, NP  predniSONE (DELTASONE) 5 MG tablet Take 5 mg by mouth daily with breakfast.    [provider]  TRELEGY ELLIPTA 100-62.5-25 MCG/INH AEPB INHALE 1 PUFF BY MOUTH EVERY DAY 08/17/18   Chesley Mires, MD    Family History Family History  Problem Relation Age of Onset  . Hypertension Father   . Heart disease Father   . Heart attack Father   . Diverticulitis Mother   . COPD Mother   . Hypertension Mother   . Heart disease Mother   . Heart attack Mother   . Heart attack Brother   . Colon cancer Brother   . Diabetes Brother   . Thyroid disease Brother     Social History Social History   Tobacco Use  . Smoking status: Former Smoker    Packs/day: 0.25    Years: 15.00    Pack years: 3.75    Types: Cigarettes    Last attempt to quit: 11/27/2001    Years since quitting: 16.8  . Smokeless tobacco: Never Used  Substance Use Topics  . Alcohol use: No  . Drug use: No     Allergies   Mercaptopurine and Remicade [infliximab]   Review of Systems Review of Systems  Constitutional: Positive for appetite change.  HENT: Negative for congestion.   Respiratory: Positive for cough and shortness of breath.   Cardiovascular: Negative for chest pain and leg swelling.  Musculoskeletal: Negative for back pain.  Skin: Negative for rash.  Neurological: Negative for weakness.     Physical Exam Updated Vital Signs BP 129/73   Pulse 92   Temp 97.7 F (36.5 C) (Oral)   Resp (!) 21   Ht 5' 4"  (1.626 m)   Wt 49 kg   SpO2 100%   BMI 18.54 kg/m   Physical Exam Vitals signs reviewed.  HENT:     Head: Atraumatic.  Eyes:     Pupils: Pupils are equal, round, and reactive to light.  Cardiovascular:     Rate and Rhythm: Regular rhythm.  Pulmonary:     Comments: Somewhat harsh breath sounds.  No focal rales.  No frank  wheezes. Chest:     Chest wall: No tenderness.  Abdominal:     Tenderness: There is no abdominal tenderness.  Musculoskeletal:     Right lower leg: No edema.     Left lower leg: No edema.  Skin:    Capillary Refill: Capillary refill takes less than 2 seconds.  Neurological:     Mental Status: She is alert.      ED Treatments / Results  Labs (all labs ordered are listed, but only abnormal results are displayed) Labs Reviewed  COMPREHENSIVE METABOLIC PANEL - Abnormal; Notable for the following components:      Result Value   Potassium 3.4 (*)    BUN 47 (*)    Creatinine, Ser 6.60 (*)    Albumin 3.4 (*)    GFR calc non Af Amer 6 (*)    GFR calc Af Amer 7 (*)    All other components within normal limits  CBC WITH DIFFERENTIAL/PLATELET - Abnormal; Notable for the following components:   RBC 3.53 (*)    Hemoglobin 11.1 (*)    MCV 103.4 (*)    RDW 17.0 (*)    Platelets 125 (*)    All other components within normal limits  SARS CORONAVIRUS 2 (HOSPITAL ORDER, [facility])  [place]    EKG None  Radiology Dg Chest Portable 1 View  Result Date: 10/13/2018 CLINICAL DATA:  Shortness of breath today. EXAM: PORTABLE CHEST 1 VIEW  COMPARISON:  Two-view chest x-ray 06/01/2018 FINDINGS: Heart size upper limits of normal. Chronic interstitial coarsening is again seen. There is no superimposed edema or effusion. No focal airspace disease is present. IMPRESSION: 1. Stable chronic interstitial disease. 2. No acute cardiopulmonary disease. Electronically Signed   By: San Morelle M.D.   On: 10/13/2018 12:45    Procedures Procedures (including critical care time)  Medications Ordered in ED Medications  ipratropium-albuterol (DUONEB) 0.5-2.5 (3) MG/3ML nebulizer solution 3 mL (has no administration in time range)     Initial Impression / Assessment and Plan / ED Course  I have reviewed the triage vital signs and the nursing notes.  Pertinent  labs & imaging results that were available during my care of the patient were reviewed by me and considered in my medical decision making (see chart for details).        Patient with worsening shortness of breath.  History of COPD which may be the cause but also recent pulmonary embolisms.  Will recheck CTA.  X-ray did not show pneumonia.  However with increasing oxygen requirement will likely require admission to hospital.  Negative COVID testing.  Care turned over to Dr. Verner Chol.  Final Clinical Impressions(s) / ED Diagnoses   Final diagnoses:  COPD exacerbation [facility])  Hypoxia    ED Discharge Orders    None       Davonna Belling, MD 10/13/18 [phone]

## 2018-10-13 NOTE — ED Notes (Signed)
Pt speaking on husband on phone

## 2018-10-13 NOTE — ED Notes (Signed)
Admitting at bedside 

## 2018-10-13 NOTE — Discharge Instructions (Addendum)
If you develop chest pain, shortness of breath, fever, vomiting, abdominal or back pain, or any other new/concerning symptoms then return to the ER for evaluation.

## 2018-10-13 NOTE — ED Notes (Signed)
Patient verbalizes understanding of discharge instructions. Opportunity for questioning and answers were provided. Armband removed by staff, pt discharged from ED.  

## 2018-10-13 NOTE — ED Notes (Signed)
Got patient undress into a gown on the montior did ekg shown to Dr Alvino Chapel patient is resting wityh call bell in reach

## 2018-10-15 ENCOUNTER — Telehealth: Payer: Self-pay | Admitting: Pulmonary Disease

## 2018-10-15 NOTE — Telephone Encounter (Signed)
Call returned to patient, she wanted to check the status of her POC.   Call made to [facility] to inquire as to the status of the POC. Spoke with [place], she states it appears all the patient needs is an appt to be evaluated for the POC. Spoke with [facility] she state they have been trying to contact the patient. Number given [phone].   Call returned to patient, made aware they have been trying to contact her. Number given to have her call to schedule her appt for her POC Tuesday as the office is closed Monday. Voiced understanding. Nothing further is needed at this time.

## 2018-10-21 ENCOUNTER — Encounter: Payer: Self-pay | Admitting: Nurse Practitioner

## 2018-10-21 ENCOUNTER — Other Ambulatory Visit: Payer: Self-pay | Admitting: General Surgery

## 2018-10-21 ENCOUNTER — Ambulatory Visit (INDEPENDENT_AMBULATORY_CARE_PROVIDER_SITE_OTHER): Payer: Medicare Other | Admitting: Nurse Practitioner

## 2018-10-21 ENCOUNTER — Other Ambulatory Visit: Payer: Self-pay

## 2018-10-21 DIAGNOSIS — R9389 Abnormal findings on diagnostic imaging of other specified body structures: Secondary | ICD-10-CM

## 2018-10-21 DIAGNOSIS — J9611 Chronic respiratory failure with hypoxia: Secondary | ICD-10-CM

## 2018-10-21 DIAGNOSIS — J441 Chronic obstructive pulmonary disease with (acute) exacerbation: Secondary | ICD-10-CM

## 2018-10-21 MED ORDER — IPRATROPIUM-ALBUTEROL 0.5-2.5 (3) MG/3ML IN SOLN: 3.0000 mL | Freq: Four times a day (QID) | RESPIRATORY_TRACT | 0 refills | Status: DC | PRN

## 2018-10-21 NOTE — Addendum Note (Signed)
Addended by: Fenton Foy on: 10/21/2018 04:28 PM   Modules accepted: Orders

## 2018-10-21 NOTE — Progress Notes (Signed)
Virtual Visit via Telephone Note  I connected with Natasha [name] on 10/21/18 at  9:00 AM EDT by telephone and verified that I am speaking with the correct person using two identifiers.  Location: Patient: home Provider: office   I discussed the limitations, risks, security and privacy concerns of performing an evaluation and management service by telephone and the availability of in person appointments. I also discussed with the patient that there may be a patient responsible charge related to this service. The patient expressed understanding and agreed to proceed.   History of Present Illness: 73 year old female former smoker with COPD/emphysema who is followed by Dr. Halford Chessman.  Patient has a tele-visit today for a follow-up.  She was seen in the ED on 10/13/2018 for COPD exacerbation.  She was discharged with prednisone taper.  She states that she has been doing well since hospital discharge.  She states that while in the hospital she was given DuoNeb nebulizer treatments.  She states that this worked well for her and is requesting a prescription for these and a nebulizer machine for home to use if needed.  She is compliant with Trelegy and states that this does work well for her.  She does need an order for a smaller POC.  She is currently on continuous oxygen at 2 L nasal cannula. Denies f/c/s, n/v/d, hemoptysis, PND, leg swelling.    Observations/Objective:  CTA 10/13/18 - No demonstrable pulmonary embolus. No thoracic aortic aneurysm or dissection. There is aortic atherosclerosis. There is prominence of the main pulmonary outflow tract, a finding indicative of a degree of pulmonary arterial hypertension. Underlying emphysematous change with areas of scarring and atelectasis. No consolidation. Stable semi-solid opacity in the anterior segment of the left upper lobe measuring 7 x 4 mm. Given stability since the 2017 study, this lesion is felt to be most certainly of benign etiology. No pleural  effusion evident.  CTA 05/28/18 -Examination is positive for pulmonary embolus. No evidence of right heart strain. Emphysema  CXR 06/01/18 -No active cardiopulmonary disease. Emphysema.  PFT 08/08/13 >> FEV1 1.00 (52%), FEV1% 50, TLC 3.97 (78%), DLCO 20% Ambulatory oximetry with RA 04/27/18 >> SpO2 80%  HST 06/26/17 >> AHI 0.5, SaO2 low 86%.  Assessment and Plan: Resolved. Patient was seen in the ED on 10/13/2018 for COPD exacerbation.  She was discharged with prednisone taper.  She states that she has been doing well since hospital discharge.  She states that while in the hospital she was given DuoNeb nebulizer treatments.  She states that this worked well for her and is requesting a prescription for these and a nebulizer machine for home to use if needed.  She is compliant with Trelegy and states that this does work well for her.  She does need an order for a smaller POC.  She is currently on continuous oxygen at 2 L nasal cannula.  Note: CTA in hospital revealed prominence of the main pulmonary outflow tract suggesting a degree of PAH. Will order Echo  Patient Instructions  Severe COPD with emphysema. -continue trelegy and prn albuterol -will order duonebs to use every 6 hours as needed  Pulmonary embolism Continue Eliquis as directed - patient is followed by hematology   Chronic respiratory failure with hypoxia. - continue 2 liters oxygen with exertion and sleep for now -will order smaller POC - patient uses [facility] for DME - will need 2L pulsed and may increase as needed with exertion to keep sats above 90%  Note: Patient walked in  office today - O2 dropped to 87% on RA and then Sats came up to 85% when placed on 2 L .   Crohn's colitis. -on chronic prednisone and humira - followed at [facility]  Stage IV CKD -patient is on dialysis - followed by [facility]   Follow Up Instructions:  Follow up with Dr. Halford Chessman in 3 months or sooner if needed   I discussed the  assessment and treatment plan with the patient. The patient was provided an opportunity to ask questions and all were answered. The patient agreed with the plan and demonstrated an understanding of the instructions.   The patient was advised to call back or seek an in-person evaluation if the symptoms worsen or if the condition fails to improve as anticipated.  I provided 22 minutes of non-face-to-face time during this encounter.   Fenton Foy, NP

## 2018-10-21 NOTE — Patient Instructions (Addendum)
Severe COPD with emphysema. -continue trelegy and prn albuterol -will order duonebs to use every 6 hours as needed  Pulmonary embolism Continue Eliquis as directed - patient is followed by hematology   Chronic respiratory failure with hypoxia. - continue 2 liters oxygen with exertion and sleep for now -will order smaller POC - patient uses [employer] for DME - will need 2L pulsed and may increase as needed with exertion to keep sats above 90%  Note: Patient walked in office today - O2 dropped to 87% on RA and then Sats came up to 85% when placed on 2 L [place].   Crohn's colitis. -on chronic prednisone and humira - followed at [facility]  Stage IV CKD. -patient is on dialysis - followed by [facility]  Follow up with Dr. Halford Chessman in 3 months or sooner if needed

## 2018-10-21 NOTE — Assessment & Plan Note (Addendum)
Resolved. Patient was seen in the ED on 10/13/2018 for COPD exacerbation.  She was discharged with prednisone taper.  She states that she has been doing well since hospital discharge.  She states that while in the hospital she was given DuoNeb nebulizer treatments.  She states that this worked well for her and is requesting a prescription for these and a nebulizer machine for home to use if needed.  She is compliant with Trelegy and states that this does work well for her.  She does need an order for a smaller POC.  She is currently on continuous oxygen at 2 L nasal cannula.  Note: CTA in hospital revealed prominence of the main pulmonary outflow tract suggesting a degree of PAH. Will order Echo  Patient Instructions  Severe COPD with emphysema. -continue trelegy and prn albuterol -will order duonebs to use every 6 hours as needed  Pulmonary embolism Continue Eliquis as directed - patient is followed by hematology   Chronic respiratory failure with hypoxia. - continue 2 liters oxygen with exertion and sleep for now -will order smaller POC - patient uses [employer] for DME - will need 2L pulsed and may increase as needed with exertion to keep sats above 90%  Note: Patient walked in office today - O2 dropped to 87% on RA and then Sats came up to 85% when placed on 2 L [place].   Crohn's colitis. -on chronic prednisone and humira - followed at [facility]  Stage IV CKD -patient is on dialysis - followed by [facility]

## 2018-10-26 ENCOUNTER — Telehealth: Payer: Self-pay

## 2018-10-26 DIAGNOSIS — J9611 Chronic respiratory failure with hypoxia: Secondary | ICD-10-CM

## 2018-10-26 DIAGNOSIS — J432 Centrilobular emphysema: Secondary | ICD-10-CM

## 2018-10-26 NOTE — Telephone Encounter (Signed)
DME order has been re entered as urgent. Nothing further needed.

## 2018-10-27 ENCOUNTER — Telehealth: Payer: Self-pay | Admitting: Pulmonary Disease

## 2018-10-27 NOTE — Telephone Encounter (Signed)
The order states length of need at the bottom it states lifetime I have sent a message back to [place] through community message

## 2018-10-27 NOTE — Telephone Encounter (Signed)
2nd order was placed on yesterday with length of therapy. Nothing further needed

## 2018-10-29 ENCOUNTER — Ambulatory Visit (HOSPITAL_COMMUNITY)
Admission: RE | Admit: 2018-10-29 | Discharge: 2018-10-29 | Disposition: A | Discharge status: Home / Self Care | Payer: Medicare Other | Source: Ambulatory Visit | Attending: Nurse Practitioner | Admitting: Nurse Practitioner

## 2018-10-29 ENCOUNTER — Other Ambulatory Visit: Payer: Self-pay

## 2018-10-29 DIAGNOSIS — Z87891 Personal history of nicotine dependence: Secondary | ICD-10-CM | POA: Diagnosis not present

## 2018-10-29 DIAGNOSIS — J441 Chronic obstructive pulmonary disease with (acute) exacerbation: Secondary | ICD-10-CM

## 2018-10-29 DIAGNOSIS — J449 Chronic obstructive pulmonary disease, unspecified: Secondary | ICD-10-CM | POA: Insufficient documentation

## 2018-10-29 DIAGNOSIS — I509 Heart failure, unspecified: Secondary | ICD-10-CM | POA: Diagnosis not present

## 2018-10-29 DIAGNOSIS — I272 Pulmonary hypertension, unspecified: Secondary | ICD-10-CM | POA: Insufficient documentation

## 2018-10-29 NOTE — Progress Notes (Signed)
Echocardiogram 2D Echocardiogram has been performed.  Natasha [name] 10/29/2018, 8:43 AM

## 2018-11-05 ENCOUNTER — Other Ambulatory Visit: Payer: Self-pay | Admitting: Nurse Practitioner

## 2018-11-12 ENCOUNTER — Other Ambulatory Visit: Payer: Self-pay | Admitting: General Surgery

## 2018-11-12 DIAGNOSIS — R931 Abnormal findings on diagnostic imaging of heart and coronary circulation: Secondary | ICD-10-CM

## 2018-11-12 NOTE — Progress Notes (Signed)
ambulatory

## 2018-11-16 ENCOUNTER — Encounter: Payer: Self-pay | Admitting: Nurse Practitioner

## 2018-11-16 NOTE — Progress Notes (Signed)
5 attempts made to contact patient with results of echo therefore letter mailed.

## 2018-11-18 ENCOUNTER — Telehealth: Payer: Self-pay | Admitting: Nurse Practitioner

## 2018-11-18 NOTE — Telephone Encounter (Signed)
Attempted to call pt but unable to reach. Left message for pt to return call. 

## 2018-11-19 ENCOUNTER — Telehealth: Payer: Self-pay | Admitting: Nurse Practitioner

## 2018-11-19 NOTE — Telephone Encounter (Signed)
Per Natasha [name]'s documentation from 11/17/2018, pt has been made aware of her results.

## 2018-11-19 NOTE — Telephone Encounter (Signed)
Called and spoke with pt to further explain the results of the echocardiogram. Stated to pt that we had placed a referral for her to see a cardiologist.  Pt stated she was told she has an appt with a cardiologist 7/18 but when I got to looking, I do not see any appt. [facility], is there any way you can tell where the referral was sent and if you are able to tell if pt does have an appt scheduled with a cardiologist? Thanks!

## 2018-11-22 NOTE — Telephone Encounter (Signed)
This was sent to [facility].  It doesn't look like an appointment has been scheduled.  Did you ask the patient where the appointment is scheduled?

## 2018-11-22 NOTE — Telephone Encounter (Signed)
Called [facility] [phone] Called and spoke with [facility] @ [place]. She will give pt a call as soon as we hang up to get scheduled. Nothing further needed.

## 2018-11-22 NOTE — Telephone Encounter (Signed)
Called and spoke with pt to check to see if she knew which doctor she had an appt with on 7/18 and pt stated she did not know which doctor it was with.  Pt said she had received a call from someone at our office in regards to the referral to cardiologist due to the results of the echo she had performed. Pt said she was asked if she had a cardiologist and said she had a one time visit with a cardiologist years ago but could not remember the name of the cardiologist but stated she remembered going to [facility] but does not remember the name of the cardiologist. Pt states she believes it was Dr. Harrell Gave.  When had pt reclarify how the 18th of July came about and pt stated she does not know. Pt then clarified that she was told we had been trying to get ahold of her since June 15 but she does not remember how the 18th first came about after she had originally stated it.

## 2018-11-22 NOTE — Telephone Encounter (Signed)
The referral was sent to [facility] on June 19th.  Pt has not been scheduled for an appointment yet.  I have sent a message to [place] to get an update, but I haven't gotten a response.  I was not able to locate cardiologist by the name of Dr. Harrell Gave.

## 2018-11-22 NOTE — Telephone Encounter (Signed)
Patient states she saw Dr. Jasper Riling Cardologist thru [place].  Patient phone number is [phone] c.

## 2018-11-23 ENCOUNTER — Telehealth: Payer: Self-pay | Admitting: Pulmonary Disease

## 2018-11-23 ENCOUNTER — Telehealth: Payer: Self-pay

## 2018-11-23 MED ORDER — PREDNISONE 10 MG PO TABS: ORAL_TABLET | ORAL | 0 refills | Status: DC

## 2018-11-23 MED ORDER — DOXYCYCLINE HYCLATE 100 MG PO TABS: 100.0000 mg | ORAL_TABLET | Freq: Two times a day (BID) | ORAL | 0 refills | Status: DC

## 2018-11-23 NOTE — Telephone Encounter (Signed)
Returned call to patient and advised of B. Volanda Napoleon, NP recommendations for mucinex.  Prescriptions to pharmacy and appt made for 12/17/18 follow up with T. Nils Pyle.  Patient advised to report to ER if symptoms worsening.  Patient acknowledged understanding.  Nothing further needed.

## 2018-11-23 NOTE — Telephone Encounter (Signed)
Returned call to patient.  Complaining of SOB for 2-3 days and feeling panicked with her breathing. " Feels" some congestion and wheezing -using nebulizer and it helps but she feels as if she is 'gasping' for breath at times.   -Had similar episode in May and had to go to the hospital.   -On 2L of oxygen most of the time at home but has had to use 4-5 L at times.   -Using nebulizer twice daily and this does help.   -Using albuterol inhaler every 4-6 hours but doesn't help as much as nebulizer.    Patient goes to dialysis and is checked there for covid she says - always negative.  Denies fluid in ankles and had televisit with cardiology today.  States it feels like the same symptoms she had in May with a 'breathing flare up' and she's trying to avoid going to hospital, thinking some extra prednisone would help.   She continues daily prednisone 69m and uses trelegy.    Denies fever, aches, chills or other symptoms - Does not go out of the house except for dialysis.    Primary Pulmonologist: [place] Last office visit and with whom: 10/21/18 NNils PyleWhat do we see them for (pulmonary problems): COPD Last OV assessment/plan:  Resolved. Patient was seen in the ED on 10/13/2018 for COPD exacerbation.  She was discharged with prednisone taper.  She states that she has been doing well since hospital discharge.  She states that while in the hospital she was given DuoNeb nebulizer treatments.  She states that this worked well for her and is requesting a prescription for these and a nebulizer machine for home to use if needed.  She is compliant with Trelegy and states that this does work well for her.  She does need an order for a smaller POC.  She is currently on continuous oxygen at 2 L nasal cannula.  Note: CTA in hospital revealed prominence of the main pulmonary outflow tract suggesting a degree of PAH. Will order Echo  Patient Instructions  Severe COPD with emphysema. -continue trelegy and prn  albuterol -will order duonebs to use every 6 hours as needed  Pulmonary embolism ContinueEliquisas directed - patient is followed by hematology  Chronic respiratory failure with hypoxia. - continue 2 liters oxygen with exertion and sleep for now -will order smaller POC - patient uses [employer] for DME - will need 2L pulsed and may increase as needed with exertion to keep sats above 90%  Note: Patient walked in office today - O2 dropped to 87% on RA and then Sats came up to 85% when placed on 2 L [place].  Crohn's colitis. -on chronic prednisone and humira - [facility] -patient is on dialysis - followed by [facility] Was appointment offered to patient (explain)?  Yes - patient states she is trying to avoid going to ER  Reason for call: SOB - requesting prednisone taper  Routed to B.WVolanda Napoleon NP for review.  [name] please advise.  Thanks

## 2018-11-23 NOTE — Telephone Encounter (Signed)
Spoke with pt and obtained verbal consent for a phone visit. Pt will have vitals ready prior to appt.   YOUR CARDIOLOGY TEAM HAS ARRANGED FOR AN E-VISIT FOR YOUR APPOINTMENT - PLEASE REVIEW IMPORTANT INFORMATION BELOW SEVERAL DAYS PRIOR TO YOUR APPOINTMENT  Due to the recent COVID-19 pandemic, we are transitioning in-person office visits to tele-medicine visits in an effort to decrease unnecessary exposure to our patients, their families, and staff. These visits are billed to your insurance just like a normal visit is. We also encourage you to sign up for MyChart if you have not already done so. You will need a smartphone if possible. For patients that do not have this, we can still complete the visit using a regular telephone but do prefer a smartphone to enable video when possible. You may have a family member that lives with you that can help. If possible, we also ask that you have a blood pressure cuff and scale at home to measure your blood pressure, heart rate and weight prior to your scheduled appointment. Patients with clinical needs that need an in-person evaluation and testing will still be able to come to the office if absolutely necessary. If you have any questions, feel free to call our office.     YOUR PROVIDER WILL BE USING THE FOLLOWING PLATFORM TO COMPLETE YOUR VISIT: [email or web address] . IF USING MYCHART - How to Download the MyChart App to Your SmartPhone   - If Apple, go to [employer] and type in MyChart in the search bar and download the app. If Android, ask patient to go to [employer] and type in [place] in the search bar and download the app. The app is free but as with any other app downloads, your phone may require you to verify saved payment information or Apple/Android password.  - You will need to then log into the app with your MyChart username and password, and select [place] as your healthcare provider to link the account.  - When it is time for your visit, go to the  MyChart app, find appointments, and click Begin Video Visit. Be sure to Select Allow for your device to access the Microphone and Camera for your visit. You will then be connected, and your provider will be with you shortly.  **If you have any issues connecting or need assistance, please contact MyChart service desk [phone])**  **If using a computer, in order to ensure the best quality for your visit, you will need to use either of the following Internet Browsers: Insurance underwriter or [employer]**  . IF USING DOXIMITY or DOXY.ME - The staff will give you instructions on receiving your link to join the meeting the day of your visit.      2-3 DAYS BEFORE YOUR APPOINTMENT  You will receive a telephone call from one of our [place] team members - your caller ID may say "Unknown caller." If this is a video visit, we will walk you through how to get the video launched on your phone. We will remind you check your blood pressure, heart rate and weight prior to your scheduled appointment. If you have an Apple Watch or Kardia, please upload any pertinent ECG strips the day before or morning of your appointment to [facility]. Our staff will also make sure you have reviewed the consent and agree to move forward with your scheduled tele-health visit.     THE DAY OF YOUR APPOINTMENT  Approximately 15 minutes prior to your scheduled appointment,  you will receive a telephone call from one of [facility] team - your caller ID may say "Unknown caller."  Our staff will confirm medications, vital signs for the day and any symptoms you may be experiencing. Please have this information available prior to the time of visit start. It may also be helpful for you to have a pad of paper and pen handy for any instructions given during your visit. They will also walk you through joining the smartphone meeting if this is a video visit.    CONSENT FOR TELE-HEALTH VISIT - PLEASE REVIEW  I hereby voluntarily  request, consent and authorize [facility] and its employed or contracted physicians, physician assistants, nurse practitioners or other licensed health care professionals (the Practitioner), to provide me with telemedicine health care services (the "Services") as deemed necessary by the treating Practitioner. I acknowledge and consent to receive the Services by the Practitioner via telemedicine. I understand that the telemedicine visit will involve communicating with the Practitioner through live audiovisual communication technology and the disclosure of certain medical information by electronic transmission. I acknowledge that I have been given the opportunity to request an in-person assessment or other available alternative prior to the telemedicine visit and am voluntarily participating in the telemedicine visit.  I understand that I have the right to withhold or withdraw my consent to the use of telemedicine in the course of my care at any time, without affecting my right to future care or treatment, and that the Practitioner or I may terminate the telemedicine visit at any time. I understand that I have the right to inspect all information obtained and/or recorded in the course of the telemedicine visit and may receive copies of available information for a reasonable fee.  I understand that some of the potential risks of receiving the Services via telemedicine include:  [name] Delay or interruption in medical evaluation due to technological equipment failure or disruption; . Information transmitted may not be sufficient (e.g. poor resolution of images) to allow for appropriate medical decision making by the Practitioner; and/or  . In rare instances, security protocols could fail, causing a breach of personal health information.  Furthermore, I acknowledge that it is my responsibility to provide information about my medical history, conditions and care that is complete and accurate to the best of my  ability. I acknowledge that Practitioner's advice, recommendations, and/or decision may be based on factors not within their control, such as incomplete or inaccurate data provided by me or distortions of diagnostic images or specimens that may result from electronic transmissions. I understand that the practice of medicine is not an exact science and that Practitioner makes no warranties or guarantees regarding treatment outcomes. I acknowledge that I will receive a copy of this consent concurrently upon execution via email to the email address I last provided but may also request a printed copy by calling the office of [place].    I understand that my insurance will be billed for this visit.   I have read or had this consent read to me. . I understand the contents of this consent, which adequately explains the benefits and risks of the Services being provided via telemedicine.  . I have been provided ample opportunity to ask questions regarding this consent and the Services and have had my questions answered to my satisfaction. . I give my informed consent for the services to be provided through the use of telemedicine in my medical care  By participating in this telemedicine visit  I agree to the above.

## 2018-11-23 NOTE — Telephone Encounter (Signed)
Advise mucinex twice daily. Will send in prescription for prednisone taper and abx. Recommend ED if symtpoms worsen in any way or O2 <90%. Follow up with Dr. Halford Chessman or [place].

## 2018-12-02 ENCOUNTER — Other Ambulatory Visit: Payer: Self-pay

## 2018-12-02 ENCOUNTER — Telehealth (INDEPENDENT_AMBULATORY_CARE_PROVIDER_SITE_OTHER): Payer: Medicare Other | Admitting: Cardiovascular Disease

## 2018-12-02 ENCOUNTER — Encounter: Payer: Self-pay | Admitting: Cardiovascular Disease

## 2018-12-02 VITALS — BP 137/63 | HR 89 | Ht 64.0 in | Wt 112.0 lb

## 2018-12-02 DIAGNOSIS — Z7189 Other specified counseling: Secondary | ICD-10-CM

## 2018-12-02 DIAGNOSIS — I5032 Chronic diastolic (congestive) heart failure: Secondary | ICD-10-CM

## 2018-12-02 NOTE — Patient Instructions (Signed)
Medication Instructions:   Your physician recommends that you continue on your current medications as directed. Please refer to the Current Medication list given to you today.  If you need a refill on your cardiac medications before your next appointment, please call your pharmacy.   Lab work:  None ordered today  If you have labs (blood work) drawn today and your tests are completely normal, you will receive your results only by: [name] MyChart Message (if you have MyChart) OR . A paper copy in the mail If you have any lab test that is abnormal or we need to change your treatment, we will call you to review the results.  Testing/Procedures:  None ordered today  Follow-Up: At [facility], you and your health needs are our priority.  As part of our continuing mission to provide you with exceptional heart care, we have created designated Provider Care Teams.  These Care Teams include your primary Cardiologist (physician) and Advanced Practice Providers (APPs -  Physician Assistants and Nurse Practitioners) who all work together to provide you with the care you need, when you need it. You will need a follow up appointment in:  6 months.  Please call our office 2 months in advance to schedule this appointment.  You may see Dr. Cleatrice Burke, MD or one of the following Advanced Practice Providers on your designated Care Team: Richardson Dopp, PA-C [place], [place] . Daune Perch, NP

## 2018-12-02 NOTE — Progress Notes (Signed)
Virtual Visit via Video Note   This visit type was conducted due to national recommendations for restrictions regarding the COVID-19 Pandemic (e.g. social distancing) in an effort to limit this patient's exposure and mitigate transmission in our community.  Due to her co-morbid illnesses, this patient is at least at moderate risk for complications without adequate follow up.  This format is felt to be most appropriate for this patient at this time.  All issues noted in this document were discussed and addressed.  A limited physical exam was performed with this format.  Please refer to the patient's chart for her consent to telehealth for [name]   Date:  12/02/2018   ID:  Natasha [name], Natasha [name] May 26, 1946, MRN [id number]  Patient Location: Home Provider Location: Home  PCP:  [name], MD  Cardiologist:    Nahser  Electrophysiologist:  None   Problem List 1. CHF - chronic diastolic CHF 2. COPD 3. CKD 4.  Aortic Insufficiency 5.  Essential HTN 6.  Chronic anemia 7.  DVT / pulmonary embolus    Previous notes August 19, 2016 - from [place], [place] is a 73 y.o. female with a hx of COPD, HTN, CKD stage 4, hypothyroidism, Crohn's disease s/p ileocolonic resection, chronic anemia, prior DVT (Coumadin DC'd in 2015; saw Vascular in 2016). She was evaluated by Dr. Acie Fredrickson in 4/15 for dyspnea. It was not felt that her shortness of breath was related to a cardiac etiology.  I saw her 07/30/16 for the evaluation of CHF. She noted chronic dyspnea but also described symptoms that sounded consistent with congestive heart failure. She also described chest discomfort with activity. Her exam was not entirely consistent with heart failure and I suspected that COPD was likely the major cause of her dyspnea. With her chronic kidney disease, I felt that she was high risk for contrast induced nephropathy. Therefore, cardiac catheterization should likely be avoided, if  possible. BNP was obtained and was normal, essentially ruling out congestive heart failure.  An echo was obtained and demonstrated normal ejection fraction, mild diastolic dysfunction and moderate aortic insufficiency.  She returns for Cardiology follow up.  She is here alone.  Her shortness of breath is unchanged.  She still notes some chest pain at times with her shortness of breath.  She denies syncope, orthopnea, PND.  Leg edema is unchanged.      Evaluation Performed:  Follow-Up Visit  Chief Complaint:  CHF,   December 02, 2018:    Natasha [name] is a 73 y.o. female with hx of chronic diastolic CHF, COPD, CKD,  She was recently seen in the ER for worsening dyspnea. Creatinine is now 6.6-   is now on HD M,W,F  Still makes lots of urine.     She was negative for covid 19. Saw Dr. Halford Chessman.   Was determinted to be COPD exacerbation.  Received prednisone taper and Abx. Feeling much better .   No swelling  No cp  Chronic dyspnea   The patient does not have symptoms concerning for COVID-19 infection (fever, chills, cough, or new shortness of breath).    Past Medical History:  Diagnosis Date  . Anal fistula   . Anemia in chronic kidney disease (CKD)   . Aortic insufficiency    Echo 3/18: mod conc LVH, EF 60-65, no RWMA, Gr 1 DD, mod AI, mild MR, normal RVSF, Trivial TR  . Arthritis   . Borderline hypertension   . Bulging  disc    L3-L4  . Chronic diarrhea    due to crohn's  . CKD (chronic kidney disease), stage IV ([place])    MWF- [address]  . Crohn's disease ([place])    chronic ileitis  . Dyspnea    with exertion  . Emphysema/COPD ([place])   . GERD (gastroesophageal reflux disease)    denies  . History of blood transfusion   . History of glaucoma   . History of kidney stones   . History of pancreatitis    2008--  mercaptopurine  . History of small bowel obstruction    12-03-2010  due to crohn's ileitis  . Hypertension    no longer on medications  . Hypothyroidism,  postsurgical    multinodule w/ hurthle cells  . Osteoporosis   . Perianal Crohn's disease ([place])   . Peripheral vascular disease ([place])    blood clot behind knee left leg  . Polyarthralgia   . Seizures ([place])    03/2017  . Wears partial dentures    upper   Past Surgical History:  Procedure Laterality Date  . ABDOMINAL HYSTERECTOMY  1990   and  Appendectomy  . AV FISTULA PLACEMENT Left 12/08/2017   Procedure: INSERTION OF ARTERIOVENOUS (AV) GRAFT WITH ARTEGRAFT TO LEFT UPPER ARM;  Surgeon: Conrad [place] Valley, MD;  Location: [facility];  Service: Vascular;  Laterality: Left;  . BASCILIC VEIN TRANSPOSITION Left 01/09/2017   Procedure: LEFT 1ST STAGE BRACHIAL VEIN TRANSPOSITION;  Surgeon: Conrad Coral, MD;  Location: [facility];  Service: Vascular;  Laterality: Left;  . BASCILIC VEIN TRANSPOSITION Left 06/16/2017   Procedure: Second Stage BASILIC VEIN TRANSPOSITION  LEFT ARM;  Surgeon: Conrad D[place] Valley, MD;  Location: [facility];  Service: Vascular;  Laterality: Left;  . BIOPSY  03/09/2018   Procedure: BIOPSY;  Surgeon: Wilford Corner, MD;  Location: [facility];  Service: Endoscopy;;  . CHOLECYSTECTOMY    . COLON RESECTION  x3 --  1978,  1987, 1989   ILEAL RESECTION x2/   [place]  . COLONOSCOPY WITH PROPOFOL N/A 09/25/2014   Procedure: COLONOSCOPY WITH PROPOFOL;  Surgeon: Garlan Fair, MD;  [facility]: [facility];  Service: Endoscopy;  Laterality: N/A;  . COLONOSCOPY WITH PROPOFOL N/A 03/09/2018   Procedure: COLONOSCOPY WITH PROPOFOL;  Surgeon: Wilford Corner, MD;  [facility];  Service: Endoscopy;  Laterality: N/A;  . CYSTOSCOPY W/ URETERAL STENT PLACEMENT Right 11/19/2016   Procedure: CYSTOSCOPY WITH RIGHT RETROGRADE PYELOGRAM/ URETEROSCOPY WITH LASER AND RIGHT URETERAL STENT PLACEMENT;  Surgeon: Ardis Hughs, MD;  [facility];  Service: Urology;  Laterality: Right;  . ESOPHAGOGASTRODUODENOSCOPY  03/04/2012   Procedure: ESOPHAGOGASTRODUODENOSCOPY (EGD);  Surgeon:  Garlan Fair, MD;  [facility];  Service: Endoscopy;  Laterality: N/A;  . EXAMINATION UNDER ANESTHESIA N/A 09/12/2014   Procedure: EXAM UNDER ANESTHESIA;  Surgeon: Rolm Bookbinder, MD;  Location: [facility];  Service: General;  Laterality: N/A;  . EYE SURGERY     laser  . FLEXIBLE SIGMOIDOSCOPY  03/04/2012   Procedure: FLEXIBLE SIGMOIDOSCOPY;  Surgeon: Garlan Fair, MD;  [facility];  Service: Endoscopy;  Laterality: N/A;  . GLAUCOMA SURGERY Bilateral   . HOLMIUM LASER APPLICATION Right 3/32/9518   Procedure: HOLMIUM LASER APPLICATION;  Surgeon: Ardis Hughs, MD;  [facility];  Service: Urology;  Laterality: Right;  . INCISION AND DRAINAGE PERIRECTAL ABSCESS N/A 09/12/2014   Procedure: IRRIGATION AND DEBRIDEMENT PERIRECTAL ABSCESS;  Surgeon: Rolm Bookbinder, MD;  Location: [facility];  Service: General;  Laterality: N/A;  . IR FLUORO GUIDE CV LINE RIGHT  03/20/2017  . IR US GUIDE VASC ACCESS RIGHT  03/20/2017  . NEGATIVE SLEEP STUDY  2008  . PLACEMENT OF SETON N/A 12/01/2014   Procedure: PLACEMENT OF SETON;  Surgeon: [name], MD;  Location: [facility];  Service: General;  Laterality: N/A;  . POLYPECTOMY  03/09/2018   Procedure: POLYPECTOMY;  Surgeon: Wilford Corner, MD;  [facility];  Service: Endoscopy;;  . RECTAL EXAM UNDER ANESTHESIA N/A 12/01/2014   Procedure: RECTAL EXAM UNDER ANESTHESIA;  Surgeon: [name], MD;  [facility];  Service: General;  Laterality: N/A;  . TOTAL THYROIDECTOMY  10-13-2003  . TRANSTHORACIC ECHOCARDIOGRAM  07-11-2013   mild LVH,  ef 55%,  moderate AR,  mild MR and TR,  trivial PR  . TUBAL LIGATION       No outpatient medications have been marked as taking for the 12/02/18 encounter (Appointment) with Nahser, Wonda Cheng, MD.     Allergies:   Mercaptopurine and Remicade [infliximab]   Social History   Tobacco Use  . Smoking status: Former Smoker    Packs/day: 0.25     Years: 15.00    Pack years: 3.75    Types: Cigarettes    Quit date: 11/27/2001    Years since quitting: 17.0  . Smokeless tobacco: Never Used  Substance Use Topics  . Alcohol use: No  . Drug use: No     Family Hx: The patient's family history includes COPD in her mother; Colon cancer in her brother; Diabetes in her brother; Diverticulitis in her mother; Heart attack in her brother, father, and mother; Heart disease in her father and mother; Hypertension in her father and mother; Thyroid disease in her brother.  ROS:   Please see the history of present illness.     All other systems reviewed and are negative.   Prior CV studies:   The following studies were reviewed today:    Labs/Other Tests and Data Reviewed:    EKG:  No ECG reviewed.  Recent Labs: 04/01/2018: Magnesium 2.4 05/28/2018: B Natriuretic Peptide 186.4 10/13/2018: ALT 15; BUN 47; Creatinine, Ser 6.60; Hemoglobin 11.1; Platelets 125; Potassium 3.4; Sodium 139   Recent Lipid Panel No results found for: CHOL, TRIG, HDL, CHOLHDL, LDLCALC, LDLDIRECT  Wt Readings from Last 3 Encounters:  10/13/18 108 lb (49 kg)  09/23/18 106 lb 6.4 oz (48.3 kg)  07/01/18 101 lb 11.2 oz (46.1 kg)     Objective:    Vital Signs:  There were no vitals taken for this visit.     ASSESSMENT & PLAN:    1. Chronic diastolic CHF   2.   DVT /pulmonary embolus:  Is now on Eliquis     COVID-19 Education: The signs and symptoms of COVID-19 were discussed with the patient and how to seek care for testing (follow up with PCP or arrange E-visit).  The importance of social distancing was discussed today.  Time:   Today, I have spent  18  minutes with the patient with telehealth technology discussing the above problems.     Medication Adjustments/Labs and Tests Ordered: Current medicines are reviewed at length with the patient today.  Concerns regarding medicines are outlined above.   Tests Ordered: No orders of the defined types  were placed in this encounter.   Medication Changes: No orders of the defined types were placed in this encounter.   Follow Up:  Virtual  Visit or In Person in 6 month(s)  Signed, Mertie Moores, MD  12/02/2018 7:31 AM    [place]

## 2018-12-07 ENCOUNTER — Other Ambulatory Visit: Payer: Self-pay

## 2018-12-07 ENCOUNTER — Ambulatory Visit (INDEPENDENT_AMBULATORY_CARE_PROVIDER_SITE_OTHER): Payer: Medicare Other | Admitting: Nurse Practitioner

## 2018-12-07 ENCOUNTER — Encounter: Payer: Self-pay | Admitting: Nurse Practitioner

## 2018-12-07 ENCOUNTER — Ambulatory Visit (INDEPENDENT_AMBULATORY_CARE_PROVIDER_SITE_OTHER): Payer: Medicare Other

## 2018-12-07 VITALS — BP 142/66 | HR 99 | Temp 98.3°F | Ht 64.0 in | Wt 115.0 lb

## 2018-12-07 DIAGNOSIS — J441 Chronic obstructive pulmonary disease with (acute) exacerbation: Secondary | ICD-10-CM | POA: Diagnosis not present

## 2018-12-07 DIAGNOSIS — R0602 Shortness of breath: Secondary | ICD-10-CM

## 2018-12-07 DIAGNOSIS — J439 Emphysema, unspecified: Secondary | ICD-10-CM | POA: Diagnosis not present

## 2018-12-07 MED ORDER — BUDESONIDE 0.5 MG/2ML IN SUSP: 0.5000 mg | Freq: Two times a day (BID) | RESPIRATORY_TRACT | 3 refills | Status: DC

## 2018-12-07 MED ORDER — YUPELRI 175 MCG/3ML IN SOLN: 1.0000 | Freq: Every day | RESPIRATORY_TRACT | 3 refills | Status: DC

## 2018-12-07 MED ORDER — FORMOTEROL FUMARATE 20 MCG/2ML IN NEBU: 20.0000 ug | INHALATION_SOLUTION | Freq: Two times a day (BID) | RESPIRATORY_TRACT | 3 refills | Status: DC

## 2018-12-07 MED ORDER — PREDNISONE 10 MG PO TABS: ORAL_TABLET | ORAL | 0 refills | Status: DC

## 2018-12-07 MED ORDER — NEBULIZER DEVI: 1.0000 | 0 refills | Status: AC | PRN

## 2018-12-07 NOTE — Progress Notes (Signed)
@Patient  ID: Natasha [name], female    DOB: 1946/05/26, 73 y.o.   MRN: [id number]  Chief Complaint  Patient presents with  . Shortness of Breath    Referring provider: [name],*  HPI   73 year old female former smoker with COPD/emphysema, history of PE (Eliquis), chronic respiratory failure (2 L O2 continuous) who is followed by Dr. Halford Chessman. PMH: Crohn's colitis (chronic prednisone and humira - followed at [facility]), Stage IV CKD ( patient is on dialysis - followed by [facility]) Maintenance: trelegy  Tests: CTA 10/13/18 - No demonstrable pulmonary embolus. No thoracic aortic aneurysm or dissection. There is aortic atherosclerosis. There is prominence of the main pulmonary outflow tract, a finding indicative of a degree of pulmonary arterial hypertension. Underlying emphysematous change with areas of scarring and atelectasis. No consolidation. Stable semi-solid opacity in the anterior segment of the left upper lobe measuring 7 x 4 mm. Given stability since the 2017 study, this lesion is felt to be most certainly of benign etiology. No pleural effusion evident.  CTA 05/28/18 -Examination is positive for pulmonary embolus. No evidence of right heart strain. Emphysema  CXR 06/01/18 -No active cardiopulmonary disease. Emphysema.  PFT 08/08/13 >> FEV1 1.00 (52%), FEV1% 50, TLC 3.97 (78%), DLCO 20% Ambulatory oximetry with RA 04/27/18 >> SpO2 80%  HST 06/26/17 >> AHI 0.5, SaO2 low 86%.   OV 12/07/18 - follow up  Patient presents today for follow-up visit.  She was recently ordered antibiotic and prednisone.  She states that while she was on prednisone she did get great relief.  Since she has completed the antibiotic and prednisone she has returned to having shortness of breath with exertion.  She does not check her O2 levels at home.  She does go to dialysis 3 times per week and has not checked in there.  She is using 2 L of oxygen continuously.  Patient is compliant  with Trelegy, but states that she feels like it does not help anymore.  She does have duo nebs to take as needed.  She denies any significant cough.  Patient has recently followed up with cardiology.  Denies f/c/s, n/v/d, hemoptysis, PND, leg swelling.    Note: Patient was walked in office today and sats did drop on 2 L pulsed POC. Patient will need 4 L O2 pulsed to keep sats above 88%. She can continue 2 L O2 at night and at home while at rest.    Allergies  Allergen Reactions  . Mercaptopurine Other (See Comments)    Caused pancreatitis  . Remicade [Infliximab] Other (See Comments)    CAUSED JOINT PAIN    Immunization History  Administered Date(s) Administered  . Influenza Split 02/28/2016, 02/23/2017  . Pneumococcal Conjugate-13 03/26/2017    Past Medical History:  Diagnosis Date  . Anal fistula   . Anemia in chronic kidney disease (CKD)   . Aortic insufficiency    Echo 3/18: mod conc LVH, EF 60-65, no RWMA, Gr 1 DD, mod AI, mild MR, normal RVSF, Trivial TR  . Arthritis   . Borderline hypertension   . Bulging disc    L3-L4  . Chronic diarrhea    due to crohn's  . CKD (chronic kidney disease), stage IV ([place])    MWF- [address]  . Crohn's disease ([place])    chronic ileitis  . Dyspnea    with exertion  . Emphysema/COPD ([place])   . GERD (gastroesophageal reflux disease)    denies  . History of blood transfusion   .  History of glaucoma   . History of kidney stones   . History of pancreatitis    2008--  mercaptopurine  . History of small bowel obstruction    12-03-2010  due to crohn's ileitis  . Hypertension    no longer on medications  . Hypothyroidism, postsurgical    multinodule w/ hurthle cells  . Osteoporosis   . Perianal Crohn's disease ([place])   . Peripheral vascular disease ([place])    blood clot behind knee left leg  . Polyarthralgia   . Seizures ([place])    03/2017  . Wears partial dentures    upper    Tobacco History: Social History   Tobacco Use   Smoking Status Former Smoker  . Packs/day: 0.25  . Years: 15.00  . Pack years: 3.75  . Types: Cigarettes  . Quit date: 11/27/2001  . Years since quitting: 17.0  Smokeless Tobacco Never Used   Counseling given: Yes   Outpatient Encounter Medications as of 12/07/2018  Medication Sig  . acetaminophen (TYLENOL) 500 MG tablet Take 1,000 mg by mouth every 6 (six) hours as needed for mild pain.   . Adalimumab (HUMIRA) 40 MG/0.8ML PSKT Inject 40 mg into the skin every 14 (fourteen) days.   [name] albuterol (VENTOLIN HFA) 108 (90 Base) MCG/ACT inhaler INHALE 2 PUFFS BY MOUTH EVERY 6 HOURS IF NEEDED FOR WHEEZING OR SHORTNESS OF BREATH  . alendronate (FOSAMAX) 70 MG tablet Take 70 mg by mouth every Monday.   . calcitRIOL (ROCALTROL) 0.25 MCG capsule Take 0.25 mcg by mouth every Monday, Wednesday, and Friday with hemodialysis.  [name] calcium acetate (PHOSLO) 667 MG capsule Take 1 capsule (667 mg total) by mouth 3 (three) times daily with meals.  . cyanocobalamin (,VITAMIN B-12,) 1000 MCG/ML injection Inject 1,000 mcg into the muscle every 30 (thirty) days.   [name] ELIQUIS 5 MG TABS tablet TK SS T BID UTD .  [name] HYDROcodone-acetaminophen (NORCO) 5-325 MG tablet Take 1 tablet by mouth every 6 (six) hours as needed for moderate pain.  [name] ipratropium-albuterol (DUONEB) 0.5-2.5 (3) MG/3ML SOLN Take 3 mLs by nebulization every 6 (six) hours as needed. Dx:J96.11  . levothyroxine (SYNTHROID, LEVOTHROID) 125 MCG tablet Take 125 mcg by mouth daily before breakfast.   . lidocaine-prilocaine (EMLA) cream APPLY SMALL AMOUNT TO ACCESS SITE (AVF) 3 TIMES A WEEK 1 HOUR BEFORE DIALYSIS. COVER WITH OCCLUSIVE DRESSING (SARAN WRAP)  . loperamide (IMODIUM) 2 MG capsule Take 2 capsules (4 mg total) by mouth as needed for diarrhea or loose stools.  . midodrine (PROAMATINE) 5 MG tablet Take 5 mg by mouth every Monday, Wednesday, and Friday. 5 mg PRIOR TO dialysis on Mon/Wed/Fri  . nitroGLYCERIN (NITROSTAT) 0.4 MG SL tablet Place 1 tablet  (0.4 mg total) under the tongue every 5 (five) minutes as needed for chest pain.  [name] ondansetron (ZOFRAN ODT) 4 MG disintegrating tablet Take 1 tablet (4 mg total) by mouth every 8 (eight) hours as needed for nausea or vomiting.  . OXYGEN Inhale 2-3 L into the lungs See admin instructions. Use every night and during the day as needed for shortness of breath  . predniSONE (DELTASONE) 5 MG tablet Take 5 mg by mouth daily with breakfast.  . TRELEGY ELLIPTA 100-62.5-25 MCG/INH AEPB INHALE 1 PUFF BY MOUTH EVERY DAY  . budesonide (PULMICORT) 0.5 MG/2ML nebulizer solution Take 2 mLs (0.5 mg total) by nebulization 2 (two) times a day.  . formoterol (PERFOROMIST) 20 MCG/2ML nebulizer solution Take 2 mLs (20 mcg total) by  nebulization 2 (two) times daily.  . predniSONE (DELTASONE) 10 MG tablet Take 4 tabs for 2 days, then 3 tabs for 2 days, then 2 tabs for 2 days, then 1 tab for 2 days, then stop  . Respiratory Therapy Supplies (NEBULIZER) DEVI 1 Device by Does not apply route as needed.  . Revefenacin (YUPELRI) 175 MCG/3ML SOLN Inhale 1 ampule into the lungs daily.   No facility-administered encounter medications on file as of 12/07/2018.      Review of Systems  Review of Systems  Constitutional: Negative.  Negative for chills and fever.  HENT: Negative.   Respiratory: Positive for shortness of breath. Negative for cough and wheezing.   Cardiovascular: Negative.  Negative for chest pain, palpitations and leg swelling.  Gastrointestinal: Negative.   Allergic/Immunologic: Negative.   Neurological: Negative.   Psychiatric/Behavioral: Negative.        Physical Exam  BP (!) 142/66 (BP Location: Right Arm, Patient Position: Sitting, Cuff Size: Normal)   Pulse 99   Temp 98.3 F (36.8 C)   Ht 5' 4"  (1.626 m)   Wt 115 lb (52.2 kg)   SpO2 100% Comment: on 3L continuous  BMI 19.74 kg/m   Wt Readings from Last 5 Encounters:  12/07/18 115 lb (52.2 kg)  12/02/18 112 lb (50.8 kg)  10/13/18 108 lb  (49 kg)  09/23/18 106 lb 6.4 oz (48.3 kg)  07/01/18 101 lb 11.2 oz (46.1 kg)     Physical Exam Vitals signs and nursing note reviewed.  Constitutional:      General: She is not in acute distress.    Appearance: She is well-developed.  Cardiovascular:     Rate and Rhythm: Normal rate and regular rhythm.  Pulmonary:     Effort: Pulmonary effort is normal.     Breath sounds: Decreased breath sounds present. No wheezing or rhonchi.  Musculoskeletal:     Right lower leg: No edema.  Neurological:     Mental Status: She is alert and oriented to person, place, and time.       Assessment & Plan:   Emphysema/COPD [facility]) Patient presents today for follow-up visit.  She was recently ordered antibiotic and prednisone.  She states that while she was on prednisone she did get great relief.  Since she has completed the antibiotic and prednisone she has returned to having shortness of breath with exertion.  She does not check her O2 levels at home.  She does go to dialysis 3 times per week and has not checked in there.  She is using 2 L of oxygen continuously.  Patient is compliant with Trelegy, but states that she feels like it does not help anymore.  She does have duo nebs to take as needed.  She denies any significant cough.  Patient has recently followed up with cardiology.   Note: Patient was walked in office today and sats did drop on 2 L pulsed POC. Patient will need 4 L O2 pulsed to keep sats above 88%. She can continue 2 L O2 at night and at home while at rest.    Concerned that patient may not be getting full effect of Trelegy do to decreased inspiratory volume. Patient does need PFT. Will trial nebulizer instead of trelegy.   Patient Instructions  Severe COPD with emphysema. -stop trelegy  - will trial perforomist, budesonide, and yupelri nebulizers -will order duonebs to use every 6 hours as needed - will order prednisone taper  Pulmonary embolism ContinueEliquisas directed -  patient  is followed by hematology  Chronic respiratory failure with hypoxia. - continue 2 liters oxygen with exertion and sleep  - Patient was walked in office today and sats did drop on 2 L pulsed POC. Patient will need 4 L O2 pulsed to keep sats above 88%. She can continue 2 L O2 at night and at home while at rest.   Crohn's colitis. -on chronic prednisone and humira - followed at [facility]  Stage IV CKD. -patient is on dialysis - followed by [facility]  Follow up: Follow up with Dr. Halford Chessman in 4-6 weeks with PFT same day as visit       Fenton Foy, NP 12/07/2018

## 2018-12-07 NOTE — Assessment & Plan Note (Signed)
Patient presents today for follow-up visit.  She was recently ordered antibiotic and prednisone.  She states that while she was on prednisone she did get great relief.  Since she has completed the antibiotic and prednisone she has returned to having shortness of breath with exertion.  She does not check her O2 levels at home.  She does go to dialysis 3 times per week and has not checked in there.  She is using 2 L of oxygen continuously.  Patient is compliant with Trelegy, but states that she feels like it does not help anymore.  She does have duo nebs to take as needed.  She denies any significant cough.  Patient has recently followed up with cardiology.   Note: Patient was walked in office today and sats did drop on 2 L pulsed POC. Patient will need 4 L O2 pulsed to keep sats above 88%. She can continue 2 L O2 at night and at home while at rest.    Concerned that patient may not be getting full effect of Trelegy do to decreased inspiratory volume. Patient does need PFT. Will trial nebulizer instead of trelegy.   Patient Instructions  Severe COPD with emphysema. -stop trelegy  - will trial perforomist, budesonide, and yupelri nebulizers -will order duonebs to use every 6 hours as needed - will order prednisone taper  Pulmonary embolism ContinueEliquisas directed - patient is followed by hematology  Chronic respiratory failure with hypoxia. - continue 2 liters oxygen with exertion and sleep  - Patient was walked in office today and sats did drop on 2 L pulsed POC. Patient will need 4 L O2 pulsed to keep sats above 88%. She can continue 2 L O2 at night and at home while at rest.   Crohn's colitis. -on chronic prednisone and humira - followed at [facility]  Stage IV CKD. -patient is on dialysis - followed by [facility]  Follow up: Follow up with Dr. Halford Chessman in 4-6 weeks with PFT same day as visit

## 2018-12-07 NOTE — Patient Instructions (Addendum)
Severe COPD with emphysema. -stop trelegy  - will trial perforomist, budesonide, and yupelri nebulizers -will order duonebs to use every 6 hours as needed - will order prednisone taper  Pulmonary embolism ContinueEliquisas directed - patient is followed by hematology  Chronic respiratory failure with hypoxia. - continue 2 liters oxygen with exertion and sleep  - Patient was walked in office today and sats did drop on 2 L pulsed POC. Patient will need 4 L O2 pulsed to keep sats above 88%. She can continue 2 L O2 at night and at home while at rest.   Crohn's colitis. -on chronic prednisone and humira - followed at [facility]  Stage IV CKD. -patient is on dialysis - followed by [facility]  Follow up: Follow up with Dr. Halford Chessman in 4-6 weeks with PFT same day as visit

## 2018-12-24 NOTE — Progress Notes (Signed)
Reviewed and agree with assessment/plan.   Mohamad Bruso, MD [facility] 05/21/2016, 12:24 PM Pager:  [phone]  

## 2018-12-27 ENCOUNTER — Telehealth: Payer: Self-pay | Admitting: Nurse Practitioner

## 2018-12-27 NOTE — Telephone Encounter (Signed)
Primary Pulmonologist: VS Last office visit and with whom: 12/07/2018 with TN What do we see them for (pulmonary problems): COPD Last OV assessment/plan: Instructions    Return in about 4 weeks (around 01/04/2019) for follow up. Severe COPD with emphysema. -stop trelegy  - will trial perforomist, budesonide, and yupelri nebulizers -will order duonebs to use every 6 hours as needed - will order prednisone taper  Pulmonary embolism ContinueEliquisas directed - patient is followed by hematology  Chronic respiratory failure with hypoxia. - continue 2 liters oxygen with exertion and sleep  - Patient was walked in office today and sats did drop on 2 L pulsed POC. Patient will need 4 L O2 pulsed to keep sats above 88%. She can continue 2 L O2 at night and at home while at rest.   Crohn's colitis. -on chronic prednisone and humira - followed at [facility]  Stage IV CKD. -patient is on dialysis - followed by [facility]  Follow up: Follow up with Dr. Halford Chessman in 4-6 weeks with PFT same day as visit     Was appointment offered to patient (explain)?  Pt wants recommendations   Reason for call: called and spoke with pt asking her if her breathing got better after last OV and pt said that her breathing did become better due to being placed on pred taper. Pt said about 4-5 days after finishing prednisone, she began having problems with SOB again. Pt said about the last 4 days her breathing has been worse.  Pt said that TN discussed trial of neb sol with her at last OV. Pt said that she has not started the perforomist, budesonide, or yupelri. Asked pt if she was given samples of the three neb meds and pt said no and pt said that no Rx had been sent to pharmacy either. Pt has not heard anything from [facility] in regards to the neb solutions. Pt has had to turn her O2 up to 4L due to overnight to see if that would help.  Pt said that she had some chest tightness yesterday 8/2. Pt also has  an occ cough.  Pt is wanting recommendations in regards to her breathing and also wanting to know status of neb solutions. Pt already has a nebulizer.  Routing this to both [facility] pool so we can see if they can provide update for [place] in regards to neb solutions and also routing to [facility].  (examples of things to ask: : When did symptoms start? Fever? Cough? Productive? Color to sputum? More sputum than usual? Wheezing? Have you needed increased oxygen? Are you taking your respiratory medications? What over the counter measures have you tried?)

## 2018-12-27 NOTE — Telephone Encounter (Signed)
The prescription for the nebulizer solution was faxed to the DME and a confirmation was received.

## 2018-12-27 NOTE — Telephone Encounter (Signed)
Called and spoke with pt letting her know that TN said to schedule televisit for her to readdress her breathing. Pt verbalized understanding. Stated to pt that we are also checking with [facility] in regards to the neb sol and pt verbalized understanding. televisit scheduled for pt tomorrow with TN at 10:30. Will leave encounter open until we have info from [employer].

## 2018-12-27 NOTE — Telephone Encounter (Signed)
I have sent Darlina Guys a community message asking if she could check to see if they had the fax of the neb sol that was sent 7/14 and if so will pt be able to get meds filled from [facility] pharmacy.   In regards to pt having complaints still of SOB, occ cough, and chest tightness, Tonya please advise if you have any recs for pt in regards to that. Thanks!

## 2018-12-27 NOTE — Telephone Encounter (Signed)
Order for 6/2 is request for nebulizer.  Order does not indicate Rx was given for nebulizer meds to be faxed by pcc's.

## 2018-12-27 NOTE — Telephone Encounter (Signed)
Looking at the orders for the neb solutions, it seems like they were placed by Debby Bud. Hinton Dyer, please advise if you had faxed these to DME for pt.

## 2018-12-28 ENCOUNTER — Encounter: Payer: Self-pay | Admitting: Nurse Practitioner

## 2018-12-28 ENCOUNTER — Other Ambulatory Visit: Payer: Self-pay

## 2018-12-28 ENCOUNTER — Ambulatory Visit (INDEPENDENT_AMBULATORY_CARE_PROVIDER_SITE_OTHER): Payer: Medicare Other | Admitting: Nurse Practitioner

## 2018-12-28 DIAGNOSIS — R0602 Shortness of breath: Secondary | ICD-10-CM | POA: Diagnosis not present

## 2018-12-28 DIAGNOSIS — J441 Chronic obstructive pulmonary disease with (acute) exacerbation: Secondary | ICD-10-CM

## 2018-12-28 MED ORDER — YUPELRI 175 MCG/3ML IN SOLN: 1.0000 | Freq: Every day | RESPIRATORY_TRACT | 3 refills | Status: DC

## 2018-12-28 MED ORDER — FORMOTEROL FUMARATE 20 MCG/2ML IN NEBU: 20.0000 ug | INHALATION_SOLUTION | Freq: Two times a day (BID) | RESPIRATORY_TRACT | 3 refills | Status: DC

## 2018-12-28 MED ORDER — BUDESONIDE 0.5 MG/2ML IN SUSP: 0.5000 mg | Freq: Two times a day (BID) | RESPIRATORY_TRACT | 3 refills | Status: DC

## 2018-12-28 NOTE — Assessment & Plan Note (Signed)
Patient has a tele-visit today for an acute visit.  She complains of an increased shortness of breath since 12/24/2018.  She states that she has been compliant with Trelegy and has used albuterol and DuoNeb as needed.  States that this did help her symptoms that she is feeling much improved today.  Continues on oxygen at 4 L pulsed with exertion and 2 L at night and at home while at rest.  She states that over the weekend she did have to increase her oxygen to 4 L.  She is back down to 2 L at rest currently.  At patient's last visit she was ordered Perforomist, budesonide, and Yupelri to replace Trelegy, but patient states she never received the medications. We will reorder these medications today. Denies f/c/s, n/v/d, hemoptysis, PND, leg swelling.  Patient Instructions  Severe COPD with emphysema with acute exacerbation: -continue trelegy - until you get your nebulizers - will trial perforomist, budesonide, and yupelri nebulizers -will order duonebs to use every 6 hours as needed - will order prednisone taper  Pulmonary embolism ContinueEliquisas directed - patient is followed by hematology  Chronic respiratory failure with hypoxia. - continue 2 liters oxygen with exertion and sleep  - Continue 4 L O2 pulsed with exertion to keep sats above 88%. She can continue 2 L O2 at night and at home while at rest.   Crohn's colitis. -on chronic prednisone and humira - followed at [facility]  Stage IV CKD. -patient is on dialysis - followed by [facility]  Follow up: Follow up with Dr. Halford Chessman in 4-6 weeks with PFT same day as visit

## 2018-12-28 NOTE — Progress Notes (Signed)
Virtual Visit via Telephone Note  I connected with Natasha [name] on 12/28/18 at 10:30 AM EDT by telephone and verified that I am speaking with the correct person using two identifiers.  Location: Patient: home Provider: office   I discussed the limitations, risks, security and privacy concerns of performing an evaluation and management service by telephone and the availability of in person appointments. I also discussed with the patient that there may be a patient responsible charge related to this service. The patient expressed understanding and agreed to proceed.   History of Present Illness: 73 year old female former smoker with COPD/emphysema, history of PE (Eliquis), chronic respiratory failure (2 L O2 continuous) who is followed by Dr. Halford Chessman. PMH: Crohn's colitis (chronic prednisone and humira - followed at [facility]), Stage IV CKD ( patient is on dialysis - followed by [place]) Maintenance: trelegy   Patient has a tele-visit today for an acute visit.  She complains of an increased shortness of breath since 12/24/2018.  She states that she has been compliant with Trelegy and has used albuterol and DuoNeb as needed.  States that this did help her symptoms that she is feeling much improved today.  Continues on oxygen at 4 L pulsed with exertion and 2 L at night and at home while at rest.  She states that over the weekend she did have to increase her oxygen to 4 L.  She is back down to 2 L at rest currently.  At patient's last visit she was ordered Perforomist, budesonide, and Yupelri to replace Trelegy, but patient states she never received the medications. We will reorder these medications today. Denies f/c/s, n/v/d, hemoptysis, PND, leg swelling.   Observations/Objective:  CTA 10/13/18 -No demonstrable pulmonary embolus. No thoracic aortic aneurysm or dissection. There is aortic atherosclerosis. There is prominence of the main pulmonary outflow tract, a finding indicative of a  degree of pulmonary arterial hypertension. Underlying emphysematous change with areas of scarring and atelectasis. No consolidation. Stable semi-solid opacity in the anterior segment of the left upper lobe measuring 7 x 4 mm. Given stability since the 2017 study, this lesion is felt to be most certainly of benign etiology. No pleural effusion evident.  CTA 05/28/18 -Examination is positive for pulmonary embolus. No evidence of right heart strain. Emphysema  CXR 06/01/18 -No active cardiopulmonary disease. Emphysema.  PFT 08/08/13 >> FEV1 1.00 (52%), FEV1% 50, TLC 3.97 (78%), DLCO 20% Ambulatory oximetry with RA 04/27/18 >> SpO2 80%  HST 06/26/17 >> AHI 0.5, SaO2 low 86%.  Assessment and Plan:  Patient has a tele-visit today for an acute visit.  She complains of an increased shortness of breath since 12/24/2018.  She states that she has been compliant with Trelegy and has used albuterol and DuoNeb as needed.  States that this did help her symptoms that she is feeling much improved today.  Continues on oxygen at 4 L pulsed with exertion and 2 L at night and at home while at rest.  She states that over the weekend she did have to increase her oxygen to 4 L.  She is back down to 2 L at rest currently.  At patient's last visit she was ordered Perforomist, budesonide, and Yupelri to replace Trelegy, but patient states she never received the medications. We will reorder these medications today. Denies f/c/s, n/v/d, hemoptysis, PND, leg swelling.  Patient Instructions  Severe COPD with emphysema with acute exacerbation: -continue trelegy - until you get your nebulizers - will trial perforomist, budesonide, and  yupelri nebulizers -will order duonebs to use every 6 hours as needed - will order prednisone taper  Pulmonary embolism ContinueEliquisas directed - patient is followed by hematology  Chronic respiratory failure with hypoxia. - continue 2 liters oxygen with exertion and sleep  -  Continue 4 L O2 pulsed with exertion to keep sats above 88%. She can continue 2 L O2 at night and at home while at rest.   Crohn's colitis. -on chronic prednisone and humira - followed at [facility]  Stage IV CKD. -patient is on dialysis - followed by [facility]    Follow Up Instructions:  Follow up with Dr. Halford Chessman in 4-6 weeks with PFT same day as visit   I discussed the assessment and treatment plan with the patient. The patient was provided an opportunity to ask questions and all were answered. The patient agreed with the plan and demonstrated an understanding of the instructions.   The patient was advised to call back or seek an in-person evaluation if the symptoms worsen or if the condition fails to improve as anticipated.  I provided 22 minutes of non-face-to-face time during this encounter.   Fenton Foy, NP

## 2018-12-28 NOTE — Patient Instructions (Addendum)
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 - continue 2 liters oxygen with exertion and sleep  - Continue 4 L O2 pulsed with exertion to keep sats above 88%. She can continue 2 L O2 at night and at home while at rest.   Crohn's colitis. -on chronic prednisone and humira - followed at [facility]  Stage IV CKD. -patient is on dialysis - followed by [facility]  Follow up: Follow up with Dr. Halford Chessman in 4-6 weeks with PFT same day as visit

## 2018-12-30 MED ORDER — IPRATROPIUM-ALBUTEROL 0.5-2.5 (3) MG/3ML IN SOLN: 3.0000 mL | Freq: Four times a day (QID) | RESPIRATORY_TRACT | 3 refills | Status: AC | PRN

## 2018-12-30 MED ORDER — YUPELRI 175 MCG/3ML IN SOLN: 1.0000 | Freq: Every day | RESPIRATORY_TRACT | 3 refills | Status: DC

## 2018-12-30 MED ORDER — PERFOROMIST 20 MCG/2ML IN NEBU: 20.0000 ug | INHALATION_SOLUTION | Freq: Two times a day (BID) | RESPIRATORY_TRACT | 3 refills | Status: DC

## 2018-12-30 MED ORDER — BUDESONIDE 0.25 MG/2ML IN SUSP: 0.2500 mg | Freq: Two times a day (BID) | RESPIRATORY_TRACT | 6 refills | Status: DC

## 2018-12-30 NOTE — Telephone Encounter (Signed)
Called and spoke with patient.   Looks like the RX for the nebulizer medication was printed not sent to pharmacy. So I have ordered the 4 nebulizer medications and sent to the [facility] of patient's choice.  Prednisone taper was not ordered and no instructions for the taper were mentioned in Office Note.   Will route to APP of the day , TP please advise

## 2018-12-30 NOTE — Telephone Encounter (Signed)
Prednisone 68m #20  4 tabs for 2 days, then 3 tabs for 2 days, 2 tabs for 2 days, then 1 tab for 2 days, then stop  Okay to sign rx

## 2018-12-30 NOTE — Telephone Encounter (Signed)
Pot calling b/c medications was  not sent to pharm for performist budesomide  And yuplie there was also orders for duonebs and prednisone taper  To be called to [facility] on [address]. If you have futher question you may call pt @ [phone] hm or [phone] cel some of these med were from  A June visit and she had a televisit on 8/4l.Hillery Hunter

## 2018-12-31 MED ORDER — PREDNISONE 10 MG PO TABS: ORAL_TABLET | ORAL | 0 refills | Status: DC

## 2018-12-31 MED ORDER — YUPELRI 175 MCG/3ML IN SOLN: 1.0000 | Freq: Every day | RESPIRATORY_TRACT | 3 refills | Status: DC

## 2018-12-31 MED ORDER — PERFOROMIST 20 MCG/2ML IN NEBU: 20.0000 ug | INHALATION_SOLUTION | Freq: Two times a day (BID) | RESPIRATORY_TRACT | 3 refills | Status: DC

## 2018-12-31 NOTE — Telephone Encounter (Signed)
Can we print of Perforomist and Yupelri prescriptions and ill sign to send to DME.  Budesonide and duoneb are reasonable through her insurance prescription plan

## 2018-12-31 NOTE — Telephone Encounter (Signed)
Findings of benefits investigation via test claims at [facility] :   Insurance: [employer] Medicare D Plan   Budesonide - # 156m  through patient's insurance is $13.62.- confirmed price through patient's [facility]  Perforomist - # [id number] a 1 month supply through patient's insurance is $ 112.39.  YuMaretta Bees # [id number] a 1 month supply through patient's insurance is $ 233.20.  Duoneb - # [id number] a 1 month supply through patient's insurance is $ 7.44.   12:08 PM RachBeatriz ChancellorhT

## 2018-12-31 NOTE — Telephone Encounter (Signed)
Patient's current DME is [facility]. Called [facility] which is the pharmacy that is with [facility] to see if they carried Perforomist and Yupelri neb sol and I was told that they do carry both of those.  The Rx for pt's Perforomist and Yupelri have been printed for [name] to sign. Will fax to [facility].  Pt's neb sol have been faxed to [place]. Nothing further needed.

## 2018-12-31 NOTE — Telephone Encounter (Signed)
Called and spoke with pt letting her know that TP said we could call in pred taper to pharmacy for her and pt stated that would be good.  Pt said that she went to pharmacy to pick up prescriptions but when she got there, she said they had Rx ipratropium-albuterol ready for her but pt said that she already had that Rx at home and did not need to have that filled/picked up at this time.  When the pharmacist was discussing costs of the budesonide, perforomist, and yupelri, pt said that she was unable to afford any of those three neb solutions due to the cost.  TP is currently out of the office today so sending to APP of day to see what advise could be given in regards to the three neb sol.  [name], please advise if you have any recommendations in regards to the perforomist, budesonide, or yuperli as pt is unable to get these filled due to cost.

## 2018-12-31 NOTE — Telephone Encounter (Signed)
Based on BIV best to send Perforomist and Yupelri to DME as they can bill through Medicare Part B with possibly better coverage.  Budesonide and Duoneb reasonable through prescription insurance.  Let us know if we can be of further assistance.

## 2018-12-31 NOTE — Telephone Encounter (Signed)
Will see if our pharmacist can help. May need to send through DME. What was she using before these nebs were ordered?

## 2018-12-31 NOTE — Telephone Encounter (Signed)
Can you run test claim to determine cost of nebulized budesonide, perforomist, and yupelri through her prescription insurance.  Most likely will benefit going through DME who can bill Medicare Part B.

## 2019-01-03 NOTE — Progress Notes (Signed)
Reviewed and agree with assessment/plan.   Tivis Wherry, MD  Pulmonary/Critical Care 05/21/2016, 12:24 PM Pager:  [phone]  

## 2019-01-07 NOTE — Progress Notes (Signed)
Reviewed and agree with assessment/plan.   Dillon Livermore, MD [place]/Critical Care 05/21/2016, 12:24 PM Pager:  [phone]  

## 2019-01-15 NOTE — Progress Notes (Signed)
Reviewed and agree with assessment/plan.   Louella Medaglia, MD [facility] [place] [facility]monary/Critical Care 05/21/2016, 12:24 PM Pager:  [phone]  

## 2019-01-15 NOTE — Progress Notes (Signed)
Reviewed and agree with assessment/plan.   Jalayna Josten, MD [place] [facility]/Critical Care 05/21/2016, 12:24 PM Pager:  [phone]  

## 2019-02-15 ENCOUNTER — Telehealth: Payer: Self-pay | Admitting: Pulmonary Disease

## 2019-02-16 NOTE — Telephone Encounter (Signed)
pft scheduled on 11/3-pr

## 2019-02-22 ENCOUNTER — Other Ambulatory Visit: Payer: Self-pay

## 2019-02-22 ENCOUNTER — Encounter: Payer: Self-pay | Admitting: Pulmonary Disease

## 2019-02-22 ENCOUNTER — Ambulatory Visit (INDEPENDENT_AMBULATORY_CARE_PROVIDER_SITE_OTHER): Payer: Medicare Other | Admitting: Pulmonary Disease

## 2019-02-22 DIAGNOSIS — N186 End stage renal disease: Secondary | ICD-10-CM

## 2019-02-22 DIAGNOSIS — Z79899 Other long term (current) drug therapy: Secondary | ICD-10-CM | POA: Diagnosis not present

## 2019-02-22 DIAGNOSIS — I82402 Acute embolism and thrombosis of unspecified deep veins of left lower extremity: Secondary | ICD-10-CM

## 2019-02-22 DIAGNOSIS — J9611 Chronic respiratory failure with hypoxia: Secondary | ICD-10-CM | POA: Diagnosis not present

## 2019-02-22 DIAGNOSIS — J441 Chronic obstructive pulmonary disease with (acute) exacerbation: Secondary | ICD-10-CM | POA: Diagnosis not present

## 2019-02-22 DIAGNOSIS — J449 Chronic obstructive pulmonary disease, unspecified: Secondary | ICD-10-CM

## 2019-02-22 DIAGNOSIS — I2699 Other pulmonary embolism without acute cor pulmonale: Secondary | ICD-10-CM

## 2019-02-22 DIAGNOSIS — Z992 Dependence on renal dialysis: Secondary | ICD-10-CM

## 2019-02-22 MED ORDER — DOXYCYCLINE HYCLATE 100 MG PO TABS: 100.0000 mg | ORAL_TABLET | Freq: Two times a day (BID) | ORAL | 0 refills | Status: DC

## 2019-02-22 MED ORDER — PREDNISONE 10 MG PO TABS: ORAL_TABLET | ORAL | 0 refills | Status: DC

## 2019-02-22 MED ORDER — TRELEGY ELLIPTA 100-62.5-25 MCG/INH IN AEPB: 1.0000 | INHALATION_SPRAY | Freq: Every day | RESPIRATORY_TRACT | 0 refills | Status: DC

## 2019-02-22 NOTE — Patient Instructions (Addendum)
You were seen today by [name], NP  For:   1. COPD with acute exacerbation (HCC)  - predniSONE (DELTASONE) 10 MG tablet; 4 tabs for 2 days, then 3 tabs for 2 days, 2 tabs for 2 days, then 1 tab for 2 days, then stop  Dispense: 20 tablet; Refill: 0 - doxycycline (VIBRA-TABS) 100 MG tablet; Take 1 tablet (100 mg total) by mouth 2 (two) times daily.  Dispense: 14 tablet; Refill: 0  Trelegy Ellipta  >>> 1 puff daily in the morning >>>rinse mouth out after use  >>> This inhaler contains 3 medications that help manage her respiratory status, contact our office if you cannot afford this medication or unable to remain on this medication  Only use your albuterol as a rescue medication to be used if you can't catch your breath by resting or doing a relaxed purse lip breathing pattern.  - The less you use it, the better it will work when you need it. - Ok to use up to 2 puffs  every 4 hours if you must but call for immediate appointment if use goes up over your usual need - Don't leave home without it !!  (think of it like the spare tire for your car)   Can use DuoNeb nebulized medication 1 time daily as needed for shortness of breath or wheezing  Please stop taking Pulmicort budesonide at this time  Note your daily symptoms > remember "red flags" for COPD:   >>>Increase in cough >>>increase in sputum production >>>increase in shortness of breath or activity  intolerance.   If you notice these symptoms, please call the office to be seen.    We will get you into follow-up with our office quickly.  We also will likely need to move up your pulmonary function testing.  2. Chronic respiratory failure with hypoxia (HCC)  Continue oxygen therapy as prescribed  >>>maintain oxygen saturations greater than 88 percent  >>>if unable to maintain oxygen saturations please contact the office  >>>do not smoke with oxygen  >>>can use nasal saline gel or nasal saline rinses to moisturize nose if oxygen  causes dryness  3. Medication management  Please complete the paperwork for [place] for you to see if you qualified to receive free Trelegy Ellipta for the rest of the calendar year.  As I believe you are in the donut hole.  4. ESRD on dialysis [facility])  Continue dialysis   We recommend today:  No orders of the defined types were placed in this encounter.  No orders of the defined types were placed in this encounter.  Meds ordered this encounter  Medications  . predniSONE (DELTASONE) 10 MG tablet    Sig: 4 tabs for 2 days, then 3 tabs for 2 days, 2 tabs for 2 days, then 1 tab for 2 days, then stop    Dispense:  20 tablet    Refill:  0  . doxycycline (VIBRA-TABS) 100 MG tablet    Sig: Take 1 tablet (100 mg total) by mouth 2 (two) times daily.    Dispense:  14 tablet    Refill:  0    Follow Up:    Return in about 2 weeks (around 03/08/2019), or if symptoms worsen or fail to improve, for Follow up with [name], Follow up with Dr. Halford Chessman.   Please do your part to reduce the spread of COVID-19:      Reduce your risk of any infection  and COVID19 by using  the similar precautions used for avoiding the common cold or flu:  [name] Wash your hands often with soap and warm water for at least 20 seconds.  If soap and water are not readily available, use an alcohol-based hand sanitizer with at least 60% alcohol.  . If coughing or sneezing, cover your mouth and nose by coughing or sneezing into the elbow areas of your shirt or coat, into a tissue or into your sleeve (not your hands). Langley Gauss A MASK when in public  . Avoid shaking hands with others and consider head nods or verbal greetings only. . Avoid touching your eyes, nose, or mouth with unwashed hands.  . Avoid close contact with people who are sick. . Avoid places or events with large numbers of people in one location, like concerts or sporting events. . If you have some symptoms but not all symptoms, continue to monitor at home and  seek medical attention if your symptoms worsen. . If you are having a medical emergency, call 911.   [place] / e-Visit: [email or web address]         [facility]: [facility]: [phone]                   [facility]: [phone]     It is flu season:   >>> Best ways to protect herself from the flu: Receive the yearly flu vaccine, practice good hand hygiene washing with soap and also using hand sanitizer when available, eat a nutritious meals, get adequate rest, hydrate appropriately   Please contact the office if your symptoms worsen or you have concerns that you are not improving.   Thank you for choosing [facility] for your healthcare, and for allowing [place] to partner with you on your healthcare journey. I am thankful to be able to provide care to you today.   [name]

## 2019-02-22 NOTE — Assessment & Plan Note (Signed)
Patient continues to struggle with medication management.  This is likely due to the multiple maintenance regimens that have been prescribed telephonically.  Will keep patient on Trelegy Ellipta at this time.  We will have patient apply for [place] for you to see if we can help with medication assistance.  If not may need to consider transitioning patient to Astra[employer]en[employer] triple therapy inhaler and maintain on samples.  We will further review at close follow-up appointment in 2 weeks.

## 2019-02-22 NOTE — Assessment & Plan Note (Signed)
Likely COPD exacerbation today based off of telephonic assessment Limited exam as visit is telephonic Patient struggling with maintenance inhaler regimen  Plan: Prednisone taper today, when finished with prednisone taper resume 5 mg prednisone daily Doxycycline today Continue Trelegy Ellipta Continue rescue inhaler every 4-6 hours as needed for shortness of breath or wheezing Okay to use DuoNeb nebulized medication 1 time daily Please stop Pulmicort and Perforomist nebulized meds

## 2019-02-22 NOTE — Assessment & Plan Note (Signed)
Plan: Continue Eliquis 

## 2019-02-22 NOTE — Assessment & Plan Note (Signed)
Plan: Continue oxygen therapy as prescribed Patient should consider purchasing a home pulse oximeter to monitor oxygen saturations at home Close follow-up with our office in 2 weeks for an office visit

## 2019-02-22 NOTE — Progress Notes (Signed)
Virtual Visit via Telephone Note  I connected with Natasha [name] on 02/22/19 at  9:30 AM EDT by telephone and verified that I am speaking with the correct person using two identifiers.  Location: Patient: Home Provider: [facility] - [address], [place], [place], [place] [address]   I discussed the limitations, risks, security and privacy concerns of performing an evaluation and management service by telephone and the availability of in person appointments. I also discussed with the patient that there may be a patient responsible charge related to this service. The patient expressed understanding and agreed to proceed.  Patient consented to consult via telephone: Yes People present and their role in pt care: Pt   History of Present Illness: 73 y.o. female  former smoker with COPD/Emphysema.  PMH: Hypothyroidism, Crohn's disease, end-stage renal disease on dialysis, protein calorie malnutrition, history of left leg DVT, history of pulmonary embolism Smoking History: Former Smoker. Quit 2003.  35-pack-year smoking history. Maintenance: Trelegy Ellipta  Pt of Dr. Halford Chessman    Chief complaint: Short of breath, wheezing   73 year old female former smoker followed in our office for COPD and emphysema.  Patient has completed multiple telephonic visits with [name], NP.  Patient scheduled for a telephonic visit today due to acute symptoms of worsening shortness of breath and wheezing that started on 02/20/2019.  Patient with known COPD and emphysema.  Last pulmonary function test was in 2015.  Last FEV1 in 2015 was 1.00, DLCO was 20% predicted.  Patient reports that on 02/20/2019 she started to have increased wheezing, shortness of breath, and a dry cough.  She feels she may be having a flare of her COPD.  She was last treated telephonically in July/2020.  Patient continues to struggle with medication management.  She is on multiple maintenance medications that contradict  each other.  I believe the patient would benefit from a in office visit to further work on medication management as well as to find a maintenance regimen that would be best for her.  Patient is currently reporting today that she is taking her Trelegy Ellipta inhaler, her rescue inhaler 2-3 times a day, Pulmicort 0.251-2 times daily, and DuoNeb at least every day, and performist as needed.  Patient has not received her flu vaccine yet.  Unfortunately patient has also reporting that she is now on the coverage For Trelegy Ellipta and is costing around $150 a month which she cannot afford.    Observations/Objective:  10/13/2018-CBC with differential-eosinophils relative 1, eosinophils absolute 0.1  12/07/2018-chest x-ray- mild hyperexpansion of lungs is noted  10/13/2018-CTA chest- no PE, prominence of main pulmonary outflow tract, indicative of PAH, underlying emphysema, semisolid opacity in anterior segment of left upper lobe measuring 7 x 4 mm given stability since 2017 study felt likely to be benign  10/29/2018-echocardiogram- LV ejection fraction -hyperdynamic systolic function, right ventricle is normal systolic function, cavity is normal, no increase in right ventricular wall thickness, right ventricular systolic pressure is mildly elevated at 44.3  06/26/2017-home sleep study- AHI 0.5, SaO2 low 86% with an average of 92%  08/01/2013-pulmonary function tests-FVC 2.01 (81% predicted, postbronchodilator ratio 50, postbronchodilator FEV1 1.00 (52% predicted), no bronchodilator response, DLCO 4.99 (20% predicted)   Assessment and Plan:  history of Left leg DVT Plan: Continue Eliquis  Hx of Pulmonary embolism ([name]) Plan: Continue Eliquis  Chronic respiratory failure with hypoxia (HCC) Plan: Continue oxygen therapy as prescribed Patient should consider purchasing a home pulse oximeter to monitor oxygen  saturations at home Close follow-up with our office in 2 weeks for an office visit  COPD  with acute exacerbation ([name]) Likely COPD exacerbation today based off of telephonic assessment Limited exam as visit is telephonic Patient struggling with maintenance inhaler regimen  Plan: Prednisone taper today, when finished with prednisone taper resume 5 mg prednisone daily Doxycycline today Continue Trelegy Ellipta Continue rescue inhaler every 4-6 hours as needed for shortness of breath or wheezing Okay to use DuoNeb nebulized medication 1 time daily Please stop Pulmicort and Perforomist nebulized meds  COPD (chronic obstructive pulmonary disease) ([name]) Plan: Continue Trelegy Ellipta Continue rescue inhaler  We will repeat pulmonary function testing to compare to 2015 results  ESRD on dialysis [name]) Plan: Continue HD  Medication management Patient continues to struggle with medication management.  This is likely due to the multiple maintenance regimens that have been prescribed telephonically.  Will keep patient on Trelegy Ellipta at this time.  We will have patient apply for [place] for you to see if we can help with medication assistance.  If not may need to consider transitioning patient to AstraZeneca triple therapy inhaler and maintain on samples.  We will further review at close follow-up appointment in 2 weeks.   Follow Up Instructions:  Return in about 2 weeks (around 03/08/2019), or if symptoms worsen or fail to improve, for Follow up with [name], Follow up with Dr. Halford Chessman.   I discussed the assessment and treatment plan with the patient. The patient was provided an opportunity to ask questions and all were answered. The patient agreed with the plan and demonstrated an understanding of the instructions.   The patient was advised to call back or seek an in-person evaluation if the symptoms worsen or if the condition fails to improve as anticipated.  I provided 24 minutes of non-face-to-face time during this encounter.   [name], NP

## 2019-02-22 NOTE — Progress Notes (Signed)
Reviewed and agree with assessment/plan.   Anntonette Madewell, MD [place] [facility]/Critical Care 05/21/2016, 12:24 PM Pager:  [phone]  

## 2019-02-22 NOTE — Assessment & Plan Note (Signed)
Plan: Continue Trelegy Ellipta Continue rescue inhaler  We will repeat pulmonary function testing to compare to 2015 results

## 2019-02-22 NOTE — Assessment & Plan Note (Signed)
Plan: Continue HD

## 2019-03-24 ENCOUNTER — Inpatient Hospital Stay (HOSPITAL_COMMUNITY)
Admission: RE | Admit: 2019-03-24 | Discharge: 2019-03-24 | Disposition: A | Discharge status: Home / Self Care | Payer: Medicare Other | Source: Ambulatory Visit

## 2019-03-24 NOTE — Progress Notes (Signed)
Attempted to call patient to inform that [facility] has closed due to weather. No answer, voicemail full. Unable to leave message for pt.

## 2019-03-25 ENCOUNTER — Other Ambulatory Visit (HOSPITAL_COMMUNITY)
Admission: RE | Admit: 2019-03-25 | Discharge: 2019-03-25 | Disposition: A | Discharge status: Home / Self Care | Payer: Medicare Other | Source: Ambulatory Visit | Attending: Pulmonary Disease | Admitting: Pulmonary Disease

## 2019-03-25 DIAGNOSIS — Z01812 Encounter for preprocedural laboratory examination: Secondary | ICD-10-CM | POA: Diagnosis present

## 2019-03-25 DIAGNOSIS — Z20828 Contact with and (suspected) exposure to other viral communicable diseases: Secondary | ICD-10-CM | POA: Insufficient documentation

## 2019-03-26 LAB — NOVEL CORONAVIRUS, NAA (HOSP ORDER, SEND-OUT TO REF LAB; TAT 18-24 HRS): SARS-CoV-2, NAA: NOT DETECTED

## 2019-03-29 ENCOUNTER — Ambulatory Visit (INDEPENDENT_AMBULATORY_CARE_PROVIDER_SITE_OTHER): Payer: Medicare Other | Admitting: Pulmonary Disease

## 2019-03-29 ENCOUNTER — Encounter: Payer: Self-pay | Admitting: Pulmonary Disease

## 2019-03-29 ENCOUNTER — Other Ambulatory Visit: Payer: Self-pay

## 2019-03-29 VITALS — BP 124/70 | HR 97 | Temp 97.8°F | Ht 62.0 in | Wt 114.0 lb

## 2019-03-29 DIAGNOSIS — J441 Chronic obstructive pulmonary disease with (acute) exacerbation: Secondary | ICD-10-CM

## 2019-03-29 DIAGNOSIS — Z86711 Personal history of pulmonary embolism: Secondary | ICD-10-CM

## 2019-03-29 DIAGNOSIS — J432 Centrilobular emphysema: Secondary | ICD-10-CM

## 2019-03-29 DIAGNOSIS — J9611 Chronic respiratory failure with hypoxia: Secondary | ICD-10-CM

## 2019-03-29 DIAGNOSIS — J439 Emphysema, unspecified: Secondary | ICD-10-CM

## 2019-03-29 LAB — PULMONARY FUNCTION TEST
DL/VA % pred: 34 %
DL/VA: 1.46 ml/min/mmHg/L
DLCO unc % pred: 29 %
DLCO unc: 5.38 ml/min/mmHg
FEF 25-75 Post: 0.25 L/sec
FEF 25-75 Pre: 0.22 L/sec
FEF2575-%Change-Post: 12 %
FEF2575-%Pred-Post: 16 %
FEF2575-%Pred-Pre: 15 %
FEV1-%Change-Post: 5 %
FEV1-%Pred-Post: 35 %
FEV1-%Pred-Pre: 33 %
FEV1-Post: 0.57 L
FEV1-Pre: 0.54 L
FEV1FVC-%Change-Post: 2 %
FEV1FVC-%Pred-Pre: 46 %
FEV6-%Change-Post: 3 %
FEV6-%Pred-Post: 73 %
FEV6-%Pred-Pre: 70 %
FEV6-Post: 1.46 L
FEV6-Pre: 1.4 L
FEV6FVC-%Change-Post: 0 %
FEV6FVC-%Pred-Post: 97 %
FEV6FVC-%Pred-Pre: 97 %
FVC-%Change-Post: 3 %
FVC-%Pred-Post: 74 %
FVC-%Pred-Pre: 72 %
FVC-Post: 1.56 L
FVC-Pre: 1.5 L
Post FEV1/FVC ratio: 37 %
Post FEV6/FVC ratio: 93 %
Pre FEV1/FVC ratio: 36 %
Pre FEV6/FVC Ratio: 93 %

## 2019-03-29 MED ORDER — TRELEGY ELLIPTA 100-62.5-25 MCG/INH IN AEPB: 1.0000 | INHALATION_SPRAY | Freq: Every day | RESPIRATORY_TRACT | 0 refills | Status: DC

## 2019-03-29 NOTE — Patient Instructions (Signed)
Check with your other doctors about whether you are due to get a booster for pneumonia 23 (pneumovax) shot  Follow up in 4 months

## 2019-03-29 NOTE — Progress Notes (Signed)
[facility]  Chief Complaint  Patient presents with  . Results    Discuss results of PFT. COPD w/acute exacerbation.    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 female former smoker with COPD/Emphysema.  Since I saw her last she was on several course of ABx and prednisone for exacerbations.  Also was found to have a PE in January 2020.  Breathing back to baseline now.  She gets winded with too much activity.  Doesn't go out much now except to dialysis.  Not having cough, wheeze, sputum, or chest pain.  Gets mild ankle swelling that clears up with dialysis.  Sleeping okay.  Using 3 liters oxygen.  Physical Exam:   Appearance - thin, wearing oxygen  ENMT - clear nasal mucosa, midline nasal  septum, no oral exudates, no LAN, trachea midline  Respiratory - normal chest wall, normal respiratory effort, no accessory muscle use, no wheeze/rales  CV - s1s2 regular rate and rhythm, no murmurs, no peripheral edema, radial pulses symmetric, AV graft Lt arm  GI - soft, non tender, no masses  Lymph - no adenopathy noted in neck and axillary areas  MSK - normal gait  Ext - no cyanosis, clubbing, or joint inflammation noted  Skin - no rashes, lesions, or ulcers  Neuro - normal strength, oriented x 3  Psych - normal mood and affect  Assessment/Plan:   Severe COPD with emphysema. - PFT reviewed today and showed severe COPD - continue trelegy with prn albuterol - expense is an issue with inhaler use while she is donut hole >>  samples of trelegy provided - discussed purse lip breathing technique - she will check with PCP and renal about whether she received pneumovax  Chronic respiratory failure with hypoxia. - continue 3 liters oxygen 24/7 - will try to arrange for portable pulse oximeter - goal SpO2 > 90%  Crohn's colitis. - on 5 mg prednisone daily and humira - followed at [facility]  Hx of PE. - eliquis per PCP  Stage IV CKD. - followed by [facility]   Patient Instructions  Check with your other doctors about whether you are due to get a booster for pneumonia 23 (pneumovax) shot  Follow up in 4 months  A total of  27 minutes were spent face to face with the patient and more than half of that time involved counseling or coordination of care.  Chesley Mires, MD  Pulmonary/Critical Care Pager: [phone] 03/29/2019, 10:28 AM  Flow Sheet     Pulmonary tests:  PFT 08/08/13 >> FEV1 1.00 (52%), FEV1% 50, TLC 3.97 (78%), DLCO 20% Ambulatory oximetry with RA 04/27/18 >> SpO2 80% PFT 03/29/19 >> FEV1 0.54 (33%), FEV1% 36, DLCO 29%, no BD  Chest imaging:  CT chest 04/17/16 >> severe centrilobular emphysema CT angio chest 05/28/18 >> PE LUL, extensive emphysema, b/l nephrolithiasis CT angio 10/13/18 >> centrilobular and paraseptal emphysema  Sleep tests:  HST 06/26/17 >> AHI 0.5, SaO2 low 86%.  Cardiac tests:  Doppler b/l legs 04/06/14 >>  DVT Lt femoral, popliteal, posterior tibial, peroneal veins Doppler Lt leg 09/11/14 >> chronic DVT Lt proximal and mid femoral veins, Lt popliteal veins Echo 10/29/18 >> EF greater than 65%, RVSP 44.3 mmHg  Medications:   Allergies as of 03/29/2019      Reactions   Mercaptopurine Other (See Comments)   Caused pancreatitis   Remicade [infliximab] Other (See Comments)   CAUSED JOINT PAIN      Medication List       Accurate as of March 29, 2019 10:28 AM. If you have any questions, ask your nurse or doctor.        STOP taking these  medications   doxycycline 100 MG tablet Commonly known as: VIBRA-TABS Stopped by: Chesley Mires, MD     TAKE these medications   acetaminophen 500 MG tablet Commonly known as: TYLENOL Take 1,000 mg by mouth every 6 (six) hours as needed for mild pain.   albuterol 108 (90 Base) MCG/ACT inhaler Commonly known as: VENTOLIN HFA INHALE 2 PUFFS BY MOUTH EVERY 6 HOURS IF NEEDED FOR WHEEZING OR SHORTNESS OF BREATH   alendronate 70 MG tablet Commonly known as: FOSAMAX Take 70 mg by mouth every Monday.   calcitRIOL 0.25 MCG capsule Commonly known as: ROCALTROL Take 0.25 mcg by mouth every Monday, Wednesday, and Friday with hemodialysis.   calcium acetate 667 MG capsule Commonly known as: PHOSLO Take 1 capsule (667 mg total) by mouth 3 (three) times daily with meals.   cyanocobalamin 1000 MCG/ML injection Commonly known as: (VITAMIN B-12) Inject 1,000 mcg into the muscle every 30 (thirty) days.   Eliquis 5 MG Tabs tablet Generic drug: apixaban TK SS T BID UTD .   Humira 40 MG/0.8ML Pskt Generic drug: Adalimumab Inject 40 mg into the skin every 14 (fourteen) days.   HYDROcodone-acetaminophen 5-325 MG tablet Commonly known as: Norco Take 1 tablet by mouth every 6 (six) hours as needed for moderate pain.   ipratropium-albuterol 0.5-2.5 (3) MG/3ML Soln Commonly known as: DUONEB Take 3 mLs by nebulization every 6 (six) hours as needed.   levothyroxine 125 MCG tablet Commonly known as: SYNTHROID Take 125 mcg by mouth daily before breakfast.   lidocaine-prilocaine cream Commonly known as: EMLA APPLY SMALL AMOUNT TO ACCESS SITE (AVF) 3 TIMES A WEEK 1 HOUR BEFORE DIALYSIS. COVER WITH OCCLUSIVE DRESSING (SARAN WRAP)   loperamide 2 MG capsule Commonly known as: IMODIUM Take 2 capsules (4 mg total) by mouth as needed for diarrhea or loose stools.   midodrine 5 MG tablet Commonly known as: PROAMATINE Take 5 mg by mouth every Monday, Wednesday, and Friday. 5 mg PRIOR TO dialysis on  Mon/Wed/Fri   Nebulizer Devi 1 Device by Does not apply route as needed.   nitroGLYCERIN 0.4 MG SL tablet Commonly known as: NITROSTAT Place 1 tablet (0.4 mg total) under the tongue every 5 (five) minutes as needed for chest pain.   ondansetron 4 MG disintegrating tablet Commonly known as: Zofran ODT Take 1 tablet (4 mg total) by mouth every 8 (eight) hours as needed for nausea or vomiting.   OXYGEN Inhale 2-3 L into the lungs See admin instructions. Use every night and during the day as needed for shortness of breath   predniSONE 5 MG tablet Commonly known as: DELTASONE Take 5 mg by mouth daily with breakfast. What changed: Another medication with the same name was removed. Continue taking this medication, and follow the directions you see here. Changed by: Chesley Mires, MD   Trelegy Ellipta 100-62.5-25  MCG/INH Aepb Generic drug: Fluticasone-Umeclidin-Vilant Inhale 1 puff into the lungs daily. What changed: Another medication with the same name was added. Make sure you understand how and when to take each. Changed by: Chesley Mires, MD   Trelegy Ellipta 100-62.5-25 MCG/INH Aepb Generic drug: Fluticasone-Umeclidin-Vilant Inhale 1 puff into the lungs daily. What changed: You were already taking a medication with the same name, and this prescription was added. Make sure you understand how and when to take each. Changed by: Chesley Mires, MD       Past Surgical History:  She  has a past surgical history that includes Colon resection (x3 --  1978,  1987, 1989); Esophagogastroduodenoscopy (03/04/2012); Flexible sigmoidoscopy (03/04/2012); Examination under anesthesia (N/A, 09/12/2014); Incision and drainage perirectal abscess (N/A, 09/12/2014); Colonoscopy with propofol (N/A, 09/25/2014); Total thyroidectomy (10-13-2003); transthoracic echocardiogram (07-11-2013); Glaucoma surgery (Bilateral); NEGATIVE SLEEP STUDY (2008); Abdominal hysterectomy (1990); Rectal exam under anesthesia (N/A,  12/01/2014); Placement of seton (N/A, 12/01/2014); Cystoscopy w/ ureteral stent placement (Right, 11/19/2016); Holmium laser application (Right, 9/83/3825); Bascilic vein transposition (Left, 01/09/2017); IR Fluoro Guide CV Line Right (03/20/2017); IR US Guide Vasc Access Right (03/20/2017); Bascilic vein transposition (Left, 06/16/2017); Cholecystectomy; Eye surgery; Tubal ligation; AV fistula placement (Left, 12/08/2017); Colonoscopy with propofol (N/A, 03/09/2018); biopsy (03/09/2018); and polypectomy (03/09/2018).  Family History:  Her family history includes COPD in her mother; Colon cancer in her brother; Diabetes in her brother; Diverticulitis in her mother; Heart attack in her brother, father, and mother; Heart disease in her father and mother; Hypertension in her father and mother; Thyroid disease in her brother.  Social History:  She  reports that she quit smoking about 17 years ago. Her smoking use included cigarettes. She started smoking about 52 years ago. She has a 35.00 pack-year smoking history. She has never used smokeless tobacco. She reports that she does not drink alcohol or use drugs.

## 2019-03-29 NOTE — Progress Notes (Signed)
PFT done today. 

## 2019-04-11 ENCOUNTER — Emergency Department (HOSPITAL_COMMUNITY)
Admission: EM | Admit: 2019-04-11 | Discharge: 2019-04-11 | Disposition: A | Discharge status: Home / Self Care | Payer: Medicare Other | Source: Home / Self Care | Attending: Emergency Medicine | Admitting: Emergency Medicine

## 2019-04-11 ENCOUNTER — Emergency Department (HOSPITAL_COMMUNITY): Payer: Medicare Other

## 2019-04-11 ENCOUNTER — Other Ambulatory Visit: Payer: Self-pay

## 2019-04-11 DIAGNOSIS — N186 End stage renal disease: Secondary | ICD-10-CM | POA: Diagnosis not present

## 2019-04-11 DIAGNOSIS — J189 Pneumonia, unspecified organism: Secondary | ICD-10-CM | POA: Insufficient documentation

## 2019-04-11 DIAGNOSIS — Z87891 Personal history of nicotine dependence: Secondary | ICD-10-CM | POA: Insufficient documentation

## 2019-04-11 DIAGNOSIS — Z79899 Other long term (current) drug therapy: Secondary | ICD-10-CM | POA: Insufficient documentation

## 2019-04-11 DIAGNOSIS — U071 COVID-19: Secondary | ICD-10-CM | POA: Insufficient documentation

## 2019-04-11 DIAGNOSIS — I5032 Chronic diastolic (congestive) heart failure: Secondary | ICD-10-CM | POA: Insufficient documentation

## 2019-04-11 DIAGNOSIS — Z7901 Long term (current) use of anticoagulants: Secondary | ICD-10-CM | POA: Insufficient documentation

## 2019-04-11 DIAGNOSIS — I130 Hypertensive heart and chronic kidney disease with heart failure and stage 1 through stage 4 chronic kidney disease, or unspecified chronic kidney disease: Secondary | ICD-10-CM | POA: Insufficient documentation

## 2019-04-11 DIAGNOSIS — N184 Chronic kidney disease, stage 4 (severe): Secondary | ICD-10-CM | POA: Insufficient documentation

## 2019-04-11 DIAGNOSIS — J449 Chronic obstructive pulmonary disease, unspecified: Secondary | ICD-10-CM | POA: Insufficient documentation

## 2019-04-11 LAB — CBC
HCT: 36 % (ref 36.0–46.0)
Hemoglobin: 10.7 g/dL — ABNORMAL LOW (ref 12.0–15.0)
MCH: 31.8 pg (ref 26.0–34.0)
MCHC: 29.7 g/dL — ABNORMAL LOW (ref 30.0–36.0)
MCV: 107.1 fL — ABNORMAL HIGH (ref 80.0–100.0)
Platelets: DECREASED 10*3/uL (ref 150–400)
RBC: 3.36 MIL/uL — ABNORMAL LOW (ref 3.87–5.11)
RDW: 15.9 % — ABNORMAL HIGH (ref 11.5–15.5)
WBC: 5.4 10*3/uL (ref 4.0–10.5)
nRBC: 0.4 % — ABNORMAL HIGH (ref 0.0–0.2)

## 2019-04-11 LAB — BASIC METABOLIC PANEL
Anion gap: 18 — ABNORMAL HIGH (ref 5–15)
BUN: 50 mg/dL — ABNORMAL HIGH (ref 8–23)
CO2: 16 mmol/L — ABNORMAL LOW (ref 22–32)
Calcium: 8.7 mg/dL — ABNORMAL LOW (ref 8.9–10.3)
Chloride: 104 mmol/L (ref 98–111)
Creatinine, Ser: 9.09 mg/dL — ABNORMAL HIGH (ref 0.44–1.00)
GFR calc Af Amer: 5 mL/min — ABNORMAL LOW (ref >60)
GFR calc non Af Amer: 4 mL/min — ABNORMAL LOW (ref >60)
Glucose, Bld: 77 mg/dL (ref 70–99)
Potassium: 3.1 mmol/L — ABNORMAL LOW (ref 3.5–5.1)
Sodium: 138 mmol/L (ref 135–145)

## 2019-04-11 LAB — MRSA PCR SCREENING: MRSA by PCR: NEGATIVE

## 2019-04-11 LAB — LACTIC ACID, PLASMA: Lactic Acid, Venous: 1 mmol/L (ref 0.5–1.9)

## 2019-04-11 LAB — TROPONIN I (HIGH SENSITIVITY)
Troponin I (High Sensitivity): 23 ng/L — ABNORMAL HIGH (ref <18)
Troponin I (High Sensitivity): 30 ng/L — ABNORMAL HIGH (ref <18)

## 2019-04-11 LAB — SARS CORONAVIRUS 2 (TAT 6-24 HRS): SARS Coronavirus 2: POSITIVE — AB

## 2019-04-11 MED ORDER — VANCOMYCIN HCL 500 MG IV SOLR: 500.0000 mg | INTRAVENOUS | Status: DC

## 2019-04-11 MED ORDER — SODIUM CHLORIDE 0.9% FLUSH: 3.0000 mL | Freq: Once | INTRAVENOUS | Status: DC

## 2019-04-11 MED ORDER — SODIUM CHLORIDE 0.9 % IV SOLN
1.0000 g | Freq: Once | INTRAVENOUS | Status: AC
  Administered 2019-04-11: 1 g via INTRAVENOUS
  Filled 2019-04-11: qty 1

## 2019-04-11 MED ORDER — ALBUTEROL SULFATE HFA 108 (90 BASE) MCG/ACT IN AERS
4.0000 | INHALATION_SPRAY | Freq: Once | RESPIRATORY_TRACT | Status: AC
  Administered 2019-04-11: 4 via RESPIRATORY_TRACT
  Filled 2019-04-11: qty 6.7

## 2019-04-11 MED ORDER — VANCOMYCIN HCL IN DEXTROSE 1-5 GM/200ML-% IV SOLN
1000.0000 mg | Freq: Once | INTRAVENOUS | Status: AC
  Administered 2019-04-11: 13:00:00 1000 mg via INTRAVENOUS
  Filled 2019-04-11: qty 200

## 2019-04-11 MED ORDER — ACETAMINOPHEN 325 MG PO TABS
650.0000 mg | ORAL_TABLET | Freq: Once | ORAL | Status: AC
  Administered 2019-04-11: 12:00:00 650 mg via ORAL
  Filled 2019-04-11: qty 2

## 2019-04-11 NOTE — ED Provider Notes (Signed)
[facility] EMERGENCY DEPARTMENT Provider Note   CSN: [id number] Arrival date & time: 04/11/19  8182     History   Chief Complaint Chief Complaint  Patient presents with  . Shortness of Breath    HPI Natasha [name] is a 73 y.o. female.     HPI  73 year old female presents with shortness of breath and cough.  Overall has had symptoms for about a week.  At first the cough was clear but now has yellow/green sputum.  No fevers.  Vomited once last week and also had some headache though that has all improved.  Had numerous episodes of diarrhea this morning and feels weaker.  No chest pain.  Is chronically on 3 L of oxygen but had to bump it up to 5 for shortness of breath.  Has been using her albuterol inhaler.  Recently finished course of steroids for presumed sinus infection.  Past Medical History:  Diagnosis Date  . Anal fistula   . Anemia in chronic kidney disease (CKD)   . Aortic insufficiency    Echo 3/18: mod conc LVH, EF 60-65, no RWMA, Gr 1 DD, mod AI, mild MR, normal RVSF, Trivial TR  . Arthritis   . Borderline hypertension   . Bulging disc    L3-L4  . Chronic diarrhea    due to crohn's  . CKD (chronic kidney disease), stage IV ([place])    MWF- [address]  . Crohn's disease ([place])    chronic ileitis  . Dyspnea    with exertion  . Emphysema/COPD ([place])   . GERD (gastroesophageal reflux disease)    denies  . History of blood transfusion   . History of glaucoma   . History of kidney stones   . History of pancreatitis    2008--  mercaptopurine  . History of small bowel obstruction    12-03-2010  due to crohn's ileitis  . Hypertension    no longer on medications  . Hypothyroidism, postsurgical    multinodule w/ hurthle cells  . Osteoporosis   . Perianal Crohn's disease ([place])   . Peripheral vascular disease ([place])    blood clot behind knee left leg  . Polyarthralgia   . Seizures ([place])    03/2017  . Wears partial dentures    upper     Patient Active Problem List   Diagnosis Date Noted  . Medication management 02/22/2019  . Chronic respiratory failure with hypoxia ([place]) 09/23/2018  . Hx of Pulmonary embolism ([place]) 05/28/2018  . COPD with acute exacerbation ([place]) 03/31/2018  . Acute sinusitis 03/31/2018  . Crohn's disease of colon with complication ([place])   . Aortic insufficiency 07/30/2016  . Atherosclerosis of aorta ([place]) 07/30/2016  . Pressure injury of skin 07/14/2016  . Gastroenteritis, acute 07/12/2016  . COPD (chronic obstructive pulmonary disease) ([place]) 10/12/2015  . Malnutrition of moderate degree ([place]) 09/13/2014  . history of Left leg DVT 09/10/2014  . Hypotension 09/10/2014  . Hypoglycemia 09/10/2014  . Ischiorectal abscess 09/09/2014  . Increased anion gap metabolic acidosis   . Generalized abdominal pain   . Chronic diastolic CHF (congestive heart failure) ([place])   . Hypertensive heart disease   . Secondary hyperparathyroidism ([place])   . Other specified hypothyroidism   . Right shoulder pain   . Thrombocytopenia ([place])   . Severe protein-calorie malnutrition ([place])   . Thyroid activity decreased   . Emphysema of lung ([place])   . Nephrolithiasis 03/29/2014  . Spinal stenosis of lumbar region 11/01/2013  .  Bilateral leg pain 09/28/2013  . Emphysema/COPD ([place])   . Acute dyspnea 09/20/2013  . Protein-calorie malnutrition, severe ([place]) 05/14/2013  . Salmonella enteritidis 06/11/2012  . Hypocalcemia 06/10/2012  . Hypothyroidism 06/10/2012  . Anemia of chronic disease 06/09/2012  . Hypomagnesemia 06/09/2012  . ESRD on dialysis ([place]) 06/09/2012  . Dehydration 06/07/2012  . Crohn's disease without complication ([place]) 64/40/3474  . Metabolic acidosis 25/95/6387  . Hypokalemia 06/07/2012  . Nausea vomiting and diarrhea 06/07/2012  . UTI (lower urinary tract infection) 06/07/2012    Past Surgical History:  Procedure Laterality Date  . ABDOMINAL HYSTERECTOMY  1990   and  Appendectomy  . AV FISTULA  PLACEMENT Left 12/08/2017   Procedure: INSERTION OF ARTERIOVENOUS (AV) GRAFT WITH ARTEGRAFT TO LEFT UPPER ARM;  Surgeon: Conrad [place], MD;  Location: [facility];  Service: Vascular;  Laterality: Left;  . BASCILIC VEIN TRANSPOSITION Left 01/09/2017   Procedure: LEFT 1ST STAGE BRACHIAL VEIN TRANSPOSITION;  Surgeon: Conrad [place], MD;  Location: [facility];  Service: Vascular;  Laterality: Left;  . BASCILIC VEIN TRANSPOSITION Left 06/16/2017   Procedure: Second Stage BASILIC VEIN TRANSPOSITION  LEFT ARM;  Surgeon: Conrad , MD;  Location: [facility];  Service: Vascular;  Laterality: Left;  . BIOPSY  03/09/2018   Procedure: BIOPSY;  Surgeon: Wilford Corner, MD;  Location: [facility];  Service: Endoscopy;;  . CHOLECYSTECTOMY    . COLON RESECTION  x3 --  1978,  1987, 1989   ILEAL RESECTION x2/   [place]  . COLONOSCOPY WITH PROPOFOL N/A 09/25/2014   Procedure: COLONOSCOPY WITH PROPOFOL;  Surgeon: Garlan Fair, MD;  Location: [facility];  Service: Endoscopy;  Laterality: N/A;  . COLONOSCOPY WITH PROPOFOL N/A 03/09/2018   Procedure: COLONOSCOPY WITH PROPOFOL;  Surgeon: Wilford Corner, MD;  [facility];  Service: Endoscopy;  Laterality: N/A;  . CYSTOSCOPY W/ URETERAL STENT PLACEMENT Right 11/19/2016   Procedure: CYSTOSCOPY WITH RIGHT RETROGRADE PYELOGRAM/ URETEROSCOPY WITH LASER AND RIGHT URETERAL STENT PLACEMENT;  Surgeon: Ardis Hughs, MD;  Location: [facility];  Service: Urology;  Laterality: Right;  . ESOPHAGOGASTRODUODENOSCOPY  03/04/2012   Procedure: ESOPHAGOGASTRODUODENOSCOPY (EGD);  Surgeon: Garlan Fair, MD;  Location[facility];  Service: Endoscopy;  Laterality: N/A;  . EXAMINATION UNDER ANESTHESIA N/A 09/12/2014   Procedure: EXAM UNDER ANESTHESIA;  Surgeon: Rolm Bookbinder, MD;  Location: [facility];  Service: General;  Laterality: N/A;  . EYE SURGERY     laser  . FLEXIBLE SIGMOIDOSCOPY  03/04/2012   Procedure: FLEXIBLE SIGMOIDOSCOPY;  Surgeon: Garlan Fair,  MD;  [facility];  Service: Endoscopy;  Laterality: N/A;  . GLAUCOMA SURGERY Bilateral   . HOLMIUM LASER APPLICATION Right 5/64/3329   Procedure: HOLMIUM LASER APPLICATION;  Surgeon: Ardis Hughs, MD;  Location: [facility];  Service: Urology;  Laterality: Right;  . INCISION AND DRAINAGE PERIRECTAL ABSCESS N/A 09/12/2014   Procedure: IRRIGATION AND DEBRIDEMENT PERIRECTAL ABSCESS;  Surgeon: Rolm Bookbinder, MD;  Location: [facility];  Service: General;  Laterality: N/A;  . IR FLUORO GUIDE CV LINE RIGHT  03/20/2017  . IR US GUIDE VASC ACCESS RIGHT  03/20/2017  . NEGATIVE SLEEP STUDY  2008  . PLACEMENT OF SETON N/A 12/01/2014   Procedure: PLACEMENT OF SETON;  Surgeon: [name], MD;  Location: [facility];  Service: General;  Laterality: N/A;  . POLYPECTOMY  03/09/2018   Procedure: POLYPECTOMY;  Surgeon: Wilford Corner, MD;  [facility];  Service: Endoscopy;;  . RECTAL EXAM UNDER  ANESTHESIA N/A 12/01/2014   Procedure: RECTAL EXAM UNDER ANESTHESIA;  Surgeon: [name], MD;  Location: [facility];  Service: General;  Laterality: N/A;  . TOTAL THYROIDECTOMY  10-13-2003  . TRANSTHORACIC ECHOCARDIOGRAM  07-11-2013   mild LVH,  ef 55%,  moderate AR,  mild MR and TR,  trivial PR  . TUBAL LIGATION       OB History   No obstetric history on file.      Home Medications    Prior to Admission medications   Medication Sig Start Date End Date Taking? Authorizing Provider  acetaminophen (TYLENOL) 500 MG tablet Take 1,000 mg by mouth every 6 (six) hours as needed for mild pain.     [provider]  Adalimumab (HUMIRA) 40 MG/0.8ML PSKT Inject 40 mg into the skin every 14 (fourteen) days.     [provider]  albuterol (VENTOLIN HFA) 108 (90 Base) MCG/ACT inhaler INHALE 2 PUFFS BY MOUTH EVERY 6 HOURS IF NEEDED FOR WHEEZING OR SHORTNESS OF BREATH 10/11/18   Chesley Mires, MD  alendronate (FOSAMAX) 70 MG tablet Take 70 mg by mouth  every Monday.  11/06/16   [provider]  calcitRIOL (ROCALTROL) 0.25 MCG capsule Take 0.25 mcg by mouth every Monday, Wednesday, and Friday with hemodialysis. 02/03/17   [provider]  calcium acetate (PHOSLO) 667 MG capsule Take 1 capsule (667 mg total) by mouth 3 (three) times daily with meals. 06/06/18   Domenic Polite, MD  cyanocobalamin (,VITAMIN B-12,) 1000 MCG/ML injection Inject 1,000 mcg into the muscle every 30 (thirty) days.     [provider]  ELIQUIS 5 MG TABS tablet TK SS T BID UTD . 08/23/18   [provider]  Fluticasone-Umeclidin-Vilant (TRELEGY ELLIPTA) 100-62.5-25 MCG/INH AEPB Inhale 1 puff into the lungs daily. 02/22/19   [name], NP  Fluticasone-Umeclidin-Vilant (TRELEGY ELLIPTA) 100-62.5-25 MCG/INH AEPB Inhale 1 puff into the lungs daily. 03/29/19   Chesley Mires, MD  HYDROcodone-acetaminophen (NORCO) 5-325 MG tablet Take 1 tablet by mouth every 6 (six) hours as needed for moderate pain. 12/08/17   Dagoberto Ligas, PA-C  ipratropium-albuterol (DUONEB) 0.5-2.5 (3) MG/3ML SOLN Take 3 mLs by nebulization every 6 (six) hours as needed. 12/30/18   Fenton Foy, NP  levothyroxine (SYNTHROID, LEVOTHROID) 125 MCG tablet Take 125 mcg by mouth daily before breakfast.     [provider]  lidocaine-prilocaine (EMLA) cream APPLY SMALL AMOUNT TO ACCESS SITE (AVF) 3 TIMES A WEEK 1 HOUR BEFORE DIALYSIS. COVER WITH OCCLUSIVE DRESSING (SARAN WRAP) 11/29/18   [provider]  loperamide (IMODIUM) 2 MG capsule Take 2 capsules (4 mg total) by mouth as needed for diarrhea or loose stools. 03/27/17   Caren Griffins, MD  midodrine (PROAMATINE) 5 MG tablet Take 5 mg by mouth every Monday, Wednesday, and Friday. 5 mg PRIOR TO dialysis on Mon/Wed/Fri 04/30/17   [provider]  nitroGLYCERIN (NITROSTAT) 0.4 MG SL tablet Place 1 tablet (0.4 mg total) under the tongue every 5 (five) minutes as needed for chest pain. 08/19/16 03/31/26  Richardson Dopp T, PA-C  ondansetron (ZOFRAN ODT) 4 MG disintegrating tablet Take 1 tablet (4 mg total) by mouth every 8 (eight) hours as needed for nausea or vomiting. 03/29/18   Deno Etienne, DO  OXYGEN Inhale 2-3 L into the lungs See admin instructions. Use every night and during the day as needed for shortness of breath    [provider]  predniSONE (DELTASONE) 5 MG tablet Take  5 mg by mouth daily with breakfast.    [provider]  Respiratory Therapy Supplies (NEBULIZER) DEVI 1 Device by Does not apply route as needed. 12/07/18   Fenton Foy, NP    Family History Family History  Problem Relation Age of Onset  . Hypertension Father   . Heart disease Father   . Heart attack Father   . Diverticulitis Mother   . COPD Mother   . Hypertension Mother   . Heart disease Mother   . Heart attack Mother   . Heart attack Brother   . Colon cancer Brother   . Diabetes Brother   . Thyroid disease Brother     Social History Social History   Tobacco Use  . Smoking status: Former Smoker    Packs/day: 1.00    Years: 35.00    Pack years: 35.00    Types: Cigarettes    Start date: 02/22/1967    Quit date: 11/27/2001    Years since quitting: 17.3  . Smokeless tobacco: Never Used  Substance Use Topics  . Alcohol use: No  . Drug use: No     Allergies   Mercaptopurine and Remicade [infliximab]   Review of Systems Review of Systems  Constitutional: Negative for fever.  Respiratory: Positive for cough, shortness of breath and wheezing.   Cardiovascular: Negative for chest pain.  Gastrointestinal: Positive for diarrhea. Negative for abdominal pain and vomiting.  Neurological: Positive for weakness.  All other systems reviewed and are negative.    Physical Exam Updated Vital Signs BP (!) 113/54   Pulse 97   Temp 100.2 F (37.9 C) (Oral)   Resp (!) 21   SpO2 100%   Physical Exam Vitals signs and nursing note reviewed.  Constitutional:      Appearance: She is  well-developed.  HENT:     Head: Normocephalic and atraumatic.     Right Ear: External ear normal.     Left Ear: External ear normal.     Nose: Nose normal.  Eyes:     General:        Right eye: No discharge.        Left eye: No discharge.  Cardiovascular:     Rate and Rhythm: Regular rhythm. Tachycardia present.  Pulmonary:     Effort: Pulmonary effort is normal. No tachypnea or accessory muscle usage.     Breath sounds: Decreased breath sounds, wheezing and rhonchi present.     Comments: Speaks in complete sentences without difficulty. No significant increased work of breathing Abdominal:     Palpations: Abdomen is soft.     Tenderness: There is no abdominal tenderness.  Musculoskeletal:     Right lower leg: No edema.     Left lower leg: No edema.  Skin:    General: Skin is warm and dry.  Neurological:     Mental Status: She is alert.  Psychiatric:        Mood and Affect: Mood is not anxious.      ED Treatments / Results  Labs (all labs ordered are listed, but only abnormal results are displayed) Labs Reviewed  BASIC METABOLIC PANEL - Abnormal; Notable for the following components:      Result Value   Potassium 3.1 (*)    CO2 16 (*)    BUN 50 (*)    Creatinine, Ser 9.09 (*)    Calcium 8.7 (*)    GFR calc non Af Amer 4 (*)    GFR calc Af  Amer 5 (*)    Anion gap 18 (*)    All other components within normal limits  CBC - Abnormal; Notable for the following components:   RBC 3.36 (*)    Hemoglobin 10.7 (*)    MCV 107.1 (*)    MCHC 29.7 (*)    RDW 15.9 (*)    nRBC 0.4 (*)    All other components within normal limits  TROPONIN I (HIGH SENSITIVITY) - Abnormal; Notable for the following components:   Troponin I (High Sensitivity) 23 (*)    All other components within normal limits  TROPONIN I (HIGH SENSITIVITY) - Abnormal; Notable for the following components:   Troponin I (High Sensitivity) 30 (*)    All other components within normal limits  CULTURE, BLOOD  (ROUTINE X 2)  CULTURE, BLOOD (ROUTINE X 2)  SARS CORONAVIRUS 2 (TAT 6-24 HRS)  MRSA PCR SCREENING  LACTIC ACID, PLASMA    EKG EKG Interpretation  Date/Time:  Monday April 11 2019 09:56:48 EST Ventricular Rate:  113 PR Interval:  114 QRS Duration: 82 QT Interval:  328 QTC Calculation: 449 R Axis:   72 Text Interpretation: Sinus tachycardia Right atrial enlargement Minimal voltage criteria for LVH, may be normal variant ( Sokolow-Lyon ) Anterior infarct , age undetermined Abnormal ECG similar to may 2020 Confirmed by Sherwood Gambler [phone]) on 04/11/2019 11:04:47 AM   Radiology Dg Chest Portable 1 View  Result Date: 04/11/2019 CLINICAL DATA:  Shortness of breath EXAM: PORTABLE CHEST 1 VIEW COMPARISON:  December 07, 2018 FINDINGS: There is ill-defined opacity in the medial right base, suspicious for pneumonia in this area. Elsewhere, the interstitium is mildly thickened but stable. No other areas suggesting consolidation. Heart is mildly enlarged with pulmonary vascularity normal. No adenopathy. Stent is noted in the left brachial region. There are surgical clips in the cervical-thoracic junction region. IMPRESSION: Ill-defined opacity medial right base, concerning for focus of pneumonia. No other similar areas of airspace opacity. Chronic interstitial thickening noted, stable. Stable cardiac prominence. No adenopathy evident. Electronically Signed   By: Lowella Grip III M.D.   On: 04/11/2019 11:12    Procedures Procedures (including critical care time)  Medications Ordered in ED Medications  sodium chloride flush (NS) 0.9 % injection 3 mL (has no administration in time range)  vancomycin (VANCOCIN) 500 mg in sodium chloride 0.9 % 100 mL IVPB (has no administration in time range)  acetaminophen (TYLENOL) tablet 650 mg (650 mg Oral Given 04/11/19 1228)  albuterol (VENTOLIN HFA) 108 (90 Base) MCG/ACT inhaler 4 puff (4 puffs Inhalation Given 04/11/19 1229)  ceFEPIme (MAXIPIME) 1 g  in sodium chloride 0.9 % 100 mL IVPB (0 g Intravenous Stopped 04/11/19 1443)  vancomycin (VANCOCIN) IVPB 1000 mg/200 mL premix (0 mg Intravenous Stopped 04/11/19 1402)     Initial Impression / Assessment and Plan / ED Course  I have reviewed the triage vital signs and the nursing notes.  Pertinent labs & imaging results that were available during my care of the patient were reviewed by me and considered in my medical decision making (see chart for details).        Patient appears to have pneumonia on chest x-ray.  This would correlate with her symptoms.  Lactate is okay and other labs are reassuring.  Low level troponin elevation is likely from kidney disease rather than ACS.  I did slightly go up but not in a fashion that would be consistent with acute MI.  I have low suspicion for PE  or ACS.  There is no chest pain.  At this point, she is on her home oxygen, her heart rate has normalized.  I discussed with nephrology, Dr. Moshe Cipro, who will be able to treat patient as an outpatient with Tressie Ellis at dialysis.  She is to call dialysis center for dialysis tomorrow given missing today.  Otherwise she appears stable for discharge home.  Testing for the novel coronavirus is currently in process and not available.  Natasha [name] was evaluated in Emergency Department on 04/11/2019 for the symptoms described in the history of present illness. She was evaluated in the context of the global COVID-19 pandemic, which necessitated consideration that the patient might be at risk for infection with the SARS-CoV-2 virus that causes COVID-19. Institutional protocols and algorithms that pertain to the evaluation of patients at risk for COVID-19 are in a state of rapid change based on information released by regulatory bodies including the CDC and federal and state organizations. These policies and algorithms were followed during the patient's care in the ED.   Final Clinical Impressions(s) / ED Diagnoses    Final diagnoses:  HCAP (healthcare-associated pneumonia)    ED Discharge Orders    None       Sherwood Gambler, MD 04/11/19 1536

## 2019-04-11 NOTE — ED Triage Notes (Signed)
Per [facility], pt complains of shob x 1 week, had been using inhaler with relief until Friday. Pt also had n/v/d last week and last her sense of smell over the weekend. Had recent negative covid test. Dialysis MWF, last treatment was Friday. Speaking in complete sentences. VSS. Hx of copd and always wears 3L [place], now on 4L [place]. Temp of 100.2 in triage.

## 2019-04-11 NOTE — Discharge Instructions (Addendum)
If you develop high fever, vomiting, coughing up blood, chest pain, worsening shortness of breath, or any other new/concerning symptoms then return to the ER for evaluation.  Otherwise, your antibiotics was given to you at dialysis and you should call today for your dialysis tomorrow.

## 2019-04-11 NOTE — Progress Notes (Signed)
I was called by the ER as patient appears to have PNA- has had a recent COVID test- but one from today is pending.  She is felt to be stable for discharge home.  Today is her dialysis day but potassium is low and O2 req is not out of proportion to what she usually requires.  She will be sent out with instructions for the HD unit to treat with fortaz for 6 treatments-  Total of 2 weeks.  We also spoke to her HD unit-- [facility] and they would have a treatment time for her tomorrow if she wishes to make up the treatment missed today she can call them.   Will also look to lower EDW by 0.5 kg just to make sure that the infiltrate is not related to volume.     Louis Meckel

## 2019-04-11 NOTE — Progress Notes (Signed)
Pharmacy Antibiotic Note  Natasha [name] is a 73 y.o. female admitted on 04/11/2019 with pneumonia.  Pharmacy has been consulted for vancomycin dosing. ESRD-HD MWF, last HD on Friday.    Plan: Vancomycin 1022m Iv x 1, then 5025mIV qHD Add MRSA PCR Monitor HD plan, clinical progression, MRSA PCR to narrow     Temp (24hrs), Avg:100.2 F (37.9 C), Min:100.2 F (37.9 C), Max:100.2 F (37.9 C)  Recent Labs  Lab 04/11/19 0955  WBC 5.4  CREATININE 9.09*    Estimated Creatinine Clearance: 4.4 mL/min (A) (by C-G formula based on SCr of 9.09 mg/dL (H)).    Allergies  Allergen Reactions  . Mercaptopurine Other (See Comments)    Caused pancreatitis  . Remicade [Infliximab] Other (See Comments)    CAUSED JOINT PAIN    Natasha RuddyPharmD Clinical Pharmacist Please check AMION for all [facility] 04/11/2019 11:46 AM

## 2019-04-13 ENCOUNTER — Emergency Department (HOSPITAL_COMMUNITY): Payer: Medicare Other

## 2019-04-13 ENCOUNTER — Other Ambulatory Visit: Payer: Self-pay

## 2019-04-13 ENCOUNTER — Encounter (HOSPITAL_COMMUNITY): Payer: Self-pay | Admitting: Emergency Medicine

## 2019-04-13 ENCOUNTER — Inpatient Hospital Stay (HOSPITAL_COMMUNITY)
Admission: EM | Admit: 2019-04-13 | Discharge: 2019-04-16 | DRG: 177 | Disposition: A | Discharge status: Home / Self Care | Payer: Medicare Other | Attending: Family Medicine | Admitting: Family Medicine

## 2019-04-13 DIAGNOSIS — Z7952 Long term (current) use of systemic steroids: Secondary | ICD-10-CM

## 2019-04-13 DIAGNOSIS — Z8616 Personal history of COVID-19: Secondary | ICD-10-CM | POA: Diagnosis present

## 2019-04-13 DIAGNOSIS — Z87442 Personal history of urinary calculi: Secondary | ICD-10-CM | POA: Diagnosis not present

## 2019-04-13 DIAGNOSIS — D649 Anemia, unspecified: Secondary | ICD-10-CM | POA: Diagnosis present

## 2019-04-13 DIAGNOSIS — Z7983 Long term (current) use of bisphosphonates: Secondary | ICD-10-CM

## 2019-04-13 DIAGNOSIS — R000 Tachycardia, unspecified: Secondary | ICD-10-CM | POA: Diagnosis not present

## 2019-04-13 DIAGNOSIS — Z87891 Personal history of nicotine dependence: Secondary | ICD-10-CM

## 2019-04-13 DIAGNOSIS — Z888 Allergy status to other drugs, medicaments and biological substances status: Secondary | ICD-10-CM

## 2019-04-13 DIAGNOSIS — J439 Emphysema, unspecified: Secondary | ICD-10-CM

## 2019-04-13 DIAGNOSIS — D638 Anemia in other chronic diseases classified elsewhere: Secondary | ICD-10-CM

## 2019-04-13 DIAGNOSIS — Z9981 Dependence on supplemental oxygen: Secondary | ICD-10-CM

## 2019-04-13 DIAGNOSIS — D696 Thrombocytopenia, unspecified: Secondary | ICD-10-CM | POA: Diagnosis present

## 2019-04-13 DIAGNOSIS — K219 Gastro-esophageal reflux disease without esophagitis: Secondary | ICD-10-CM | POA: Diagnosis present

## 2019-04-13 DIAGNOSIS — M810 Age-related osteoporosis without current pathological fracture: Secondary | ICD-10-CM | POA: Diagnosis present

## 2019-04-13 DIAGNOSIS — Z8349 Family history of other endocrine, nutritional and metabolic diseases: Secondary | ICD-10-CM

## 2019-04-13 DIAGNOSIS — N186 End stage renal disease: Secondary | ICD-10-CM | POA: Diagnosis present

## 2019-04-13 DIAGNOSIS — K5080 Crohn's disease of both small and large intestine without complications: Secondary | ICD-10-CM | POA: Diagnosis present

## 2019-04-13 DIAGNOSIS — Z9049 Acquired absence of other specified parts of digestive tract: Secondary | ICD-10-CM

## 2019-04-13 DIAGNOSIS — Z86711 Personal history of pulmonary embolism: Secondary | ICD-10-CM

## 2019-04-13 DIAGNOSIS — E890 Postprocedural hypothyroidism: Secondary | ICD-10-CM | POA: Diagnosis present

## 2019-04-13 DIAGNOSIS — G9341 Metabolic encephalopathy: Secondary | ICD-10-CM | POA: Diagnosis not present

## 2019-04-13 DIAGNOSIS — E876 Hypokalemia: Secondary | ICD-10-CM | POA: Diagnosis not present

## 2019-04-13 DIAGNOSIS — Z992 Dependence on renal dialysis: Secondary | ICD-10-CM

## 2019-04-13 DIAGNOSIS — D539 Nutritional anemia, unspecified: Secondary | ICD-10-CM | POA: Diagnosis present

## 2019-04-13 DIAGNOSIS — U071 COVID-19: Secondary | ICD-10-CM | POA: Diagnosis present

## 2019-04-13 DIAGNOSIS — I5032 Chronic diastolic (congestive) heart failure: Secondary | ICD-10-CM | POA: Diagnosis present

## 2019-04-13 DIAGNOSIS — N2581 Secondary hyperparathyroidism of renal origin: Secondary | ICD-10-CM | POA: Diagnosis present

## 2019-04-13 DIAGNOSIS — Z833 Family history of diabetes mellitus: Secondary | ICD-10-CM

## 2019-04-13 DIAGNOSIS — H409 Unspecified glaucoma: Secondary | ICD-10-CM | POA: Diagnosis present

## 2019-04-13 DIAGNOSIS — D631 Anemia in chronic kidney disease: Secondary | ICD-10-CM | POA: Diagnosis present

## 2019-04-13 DIAGNOSIS — I739 Peripheral vascular disease, unspecified: Secondary | ICD-10-CM | POA: Diagnosis present

## 2019-04-13 DIAGNOSIS — E8889 Other specified metabolic disorders: Secondary | ICD-10-CM | POA: Diagnosis present

## 2019-04-13 DIAGNOSIS — Z8249 Family history of ischemic heart disease and other diseases of the circulatory system: Secondary | ICD-10-CM

## 2019-04-13 DIAGNOSIS — I132 Hypertensive heart and chronic kidney disease with heart failure and with stage 5 chronic kidney disease, or end stage renal disease: Secondary | ICD-10-CM | POA: Diagnosis present

## 2019-04-13 DIAGNOSIS — E039 Hypothyroidism, unspecified: Secondary | ICD-10-CM | POA: Diagnosis not present

## 2019-04-13 DIAGNOSIS — Z90710 Acquired absence of both cervix and uterus: Secondary | ICD-10-CM

## 2019-04-13 DIAGNOSIS — Z79899 Other long term (current) drug therapy: Secondary | ICD-10-CM | POA: Diagnosis not present

## 2019-04-13 DIAGNOSIS — Z825 Family history of asthma and other chronic lower respiratory diseases: Secondary | ICD-10-CM

## 2019-04-13 DIAGNOSIS — J432 Centrilobular emphysema: Secondary | ICD-10-CM | POA: Diagnosis present

## 2019-04-13 DIAGNOSIS — Z79890 Hormone replacement therapy (postmenopausal): Secondary | ICD-10-CM

## 2019-04-13 DIAGNOSIS — J9621 Acute and chronic respiratory failure with hypoxia: Secondary | ICD-10-CM | POA: Diagnosis present

## 2019-04-13 DIAGNOSIS — Z7901 Long term (current) use of anticoagulants: Secondary | ICD-10-CM

## 2019-04-13 DIAGNOSIS — I953 Hypotension of hemodialysis: Secondary | ICD-10-CM | POA: Diagnosis not present

## 2019-04-13 DIAGNOSIS — I2699 Other pulmonary embolism without acute cor pulmonale: Secondary | ICD-10-CM | POA: Diagnosis present

## 2019-04-13 DIAGNOSIS — I351 Nonrheumatic aortic (valve) insufficiency: Secondary | ICD-10-CM | POA: Diagnosis present

## 2019-04-13 DIAGNOSIS — R0602 Shortness of breath: Secondary | ICD-10-CM

## 2019-04-13 DIAGNOSIS — Z800 Family history of malignant neoplasm of digestive organs: Secondary | ICD-10-CM

## 2019-04-13 DIAGNOSIS — Z86718 Personal history of other venous thrombosis and embolism: Secondary | ICD-10-CM

## 2019-04-13 LAB — BASIC METABOLIC PANEL
Anion gap: 13 (ref 5–15)
BUN: 68 mg/dL — ABNORMAL HIGH (ref 8–23)
CO2: 12 mmol/L — ABNORMAL LOW (ref 22–32)
Calcium: 8.3 mg/dL — ABNORMAL LOW (ref 8.9–10.3)
Chloride: 114 mmol/L — ABNORMAL HIGH (ref 98–111)
Creatinine, Ser: 11.56 mg/dL — ABNORMAL HIGH (ref 0.44–1.00)
GFR calc Af Amer: 3 mL/min — ABNORMAL LOW (ref >60)
GFR calc non Af Amer: 3 mL/min — ABNORMAL LOW (ref >60)
Glucose, Bld: 77 mg/dL (ref 70–99)
Potassium: 3.7 mmol/L (ref 3.5–5.1)
Sodium: 139 mmol/L (ref 135–145)

## 2019-04-13 LAB — CBC WITH DIFFERENTIAL/PLATELET
Abs Immature Granulocytes: 0.03 10*3/uL (ref 0.00–0.07)
Basophils Absolute: 0 10*3/uL (ref 0.0–0.1)
Basophils Relative: 0 %
Eosinophils Absolute: 0.1 10*3/uL (ref 0.0–0.5)
Eosinophils Relative: 1 %
HCT: 38.1 % (ref 36.0–46.0)
Hemoglobin: 11.1 g/dL — ABNORMAL LOW (ref 12.0–15.0)
Immature Granulocytes: 1 %
Lymphocytes Relative: 15 %
Lymphs Abs: 0.8 10*3/uL (ref 0.7–4.0)
MCH: 32.1 pg (ref 26.0–34.0)
MCHC: 29.1 g/dL — ABNORMAL LOW (ref 30.0–36.0)
MCV: 110.1 fL — ABNORMAL HIGH (ref 80.0–100.0)
Monocytes Absolute: 0.6 10*3/uL (ref 0.1–1.0)
Monocytes Relative: 11 %
Neutro Abs: 3.7 10*3/uL (ref 1.7–7.7)
Neutrophils Relative %: 72 %
Platelets: 117 10*3/uL — ABNORMAL LOW (ref 150–400)
RBC: 3.46 MIL/uL — ABNORMAL LOW (ref 3.87–5.11)
RDW: 16.6 % — ABNORMAL HIGH (ref 11.5–15.5)
WBC: 5.2 10*3/uL (ref 4.0–10.5)
nRBC: 0.4 % — ABNORMAL HIGH (ref 0.0–0.2)

## 2019-04-13 LAB — TYPE AND SCREEN
ABO/RH(D): O POS
Antibody Screen: NEGATIVE

## 2019-04-13 LAB — FIBRINOGEN: Fibrinogen: 480 mg/dL — ABNORMAL HIGH (ref 210–475)

## 2019-04-13 LAB — HEPATITIS B SURFACE ANTIGEN: Hepatitis B Surface Ag: NONREACTIVE

## 2019-04-13 LAB — TSH: TSH: 3.318 u[IU]/mL (ref 0.350–4.500)

## 2019-04-13 LAB — LACTIC ACID, PLASMA: Lactic Acid, Venous: 0.6 mmol/L (ref 0.5–1.9)

## 2019-04-13 LAB — TROPONIN I (HIGH SENSITIVITY)
Troponin I (High Sensitivity): 26 ng/L — ABNORMAL HIGH (ref <18)
Troponin I (High Sensitivity): 23 ng/L — ABNORMAL HIGH (ref <18)

## 2019-04-13 LAB — C-REACTIVE PROTEIN: CRP: 8.8 mg/dL — ABNORMAL HIGH (ref <1.0)

## 2019-04-13 LAB — LACTATE DEHYDROGENASE: LDH: 198 U/L — ABNORMAL HIGH (ref 98–192)

## 2019-04-13 LAB — PROCALCITONIN: Procalcitonin: 2.16 ng/mL

## 2019-04-13 LAB — FERRITIN: Ferritin: 810 ng/mL — ABNORMAL HIGH (ref 11–307)

## 2019-04-13 LAB — D-DIMER, QUANTITATIVE: D-Dimer, Quant: 1.82 ug/mL-FEU — ABNORMAL HIGH (ref 0.00–0.50)

## 2019-04-13 MED ORDER — LEVOTHYROXINE SODIUM 25 MCG PO TABS
125.0000 ug | ORAL_TABLET | Freq: Every day | ORAL | Status: DC
  Administered 2019-04-14 – 2019-04-16 (×2): 125 ug via ORAL
  Filled 2019-04-13 (×3): qty 1

## 2019-04-13 MED ORDER — UMECLIDINIUM BROMIDE 62.5 MCG/INH IN AEPB
1.0000 | INHALATION_SPRAY | Freq: Every day | RESPIRATORY_TRACT | Status: DC
  Administered 2019-04-14: 1 via RESPIRATORY_TRACT
  Filled 2019-04-13 (×2): qty 7

## 2019-04-13 MED ORDER — MORPHINE SULFATE (PF) 4 MG/ML IV SOLN
4.0000 mg | Freq: Once | INTRAVENOUS | Status: AC
  Administered 2019-04-13: 4 mg via INTRAVENOUS
  Filled 2019-04-13: qty 1

## 2019-04-13 MED ORDER — VITAMIN C 500 MG PO TABS
500.0000 mg | ORAL_TABLET | Freq: Every day | ORAL | Status: DC
  Administered 2019-04-13 – 2019-04-16 (×3): 500 mg via ORAL
  Filled 2019-04-13 (×3): qty 1

## 2019-04-13 MED ORDER — HYDROCOD POLST-CPM POLST ER 10-8 MG/5ML PO SUER: 5.0000 mL | Freq: Two times a day (BID) | ORAL | Status: DC | PRN

## 2019-04-13 MED ORDER — CALCIUM ACETATE (PHOS BINDER) 667 MG PO CAPS
667.0000 mg | ORAL_CAPSULE | Freq: Three times a day (TID) | ORAL | Status: DC
  Administered 2019-04-13 – 2019-04-16 (×8): 667 mg via ORAL
  Filled 2019-04-13 (×10): qty 1

## 2019-04-13 MED ORDER — FLUTICASONE PROPIONATE 50 MCG/ACT NA SUSP
1.0000 | Freq: Every day | NASAL | Status: DC
  Administered 2019-04-14: 1 via NASAL
  Administered 2019-04-16: 2 via NASAL
  Filled 2019-04-13 (×2): qty 16

## 2019-04-13 MED ORDER — CALCITRIOL 0.25 MCG PO CAPS
0.2500 ug | ORAL_CAPSULE | ORAL | Status: DC
  Filled 2019-04-13: qty 1

## 2019-04-13 MED ORDER — ONDANSETRON HCL 4 MG/2ML IJ SOLN
4.0000 mg | Freq: Once | INTRAMUSCULAR | Status: AC
  Administered 2019-04-13: 4 mg via INTRAVENOUS
  Filled 2019-04-13: qty 2

## 2019-04-13 MED ORDER — LIDOCAINE-PRILOCAINE 2.5-2.5 % EX CREA
1.0000 "application " | TOPICAL_CREAM | CUTANEOUS | Status: DC
  Filled 2019-04-13: qty 5

## 2019-04-13 MED ORDER — DEXAMETHASONE 4 MG PO TABS
6.0000 mg | ORAL_TABLET | ORAL | Status: DC
  Administered 2019-04-13: 18:00:00 6 mg via ORAL
  Filled 2019-04-13: qty 2

## 2019-04-13 MED ORDER — ZINC SULFATE 220 (50 ZN) MG PO CAPS
220.0000 mg | ORAL_CAPSULE | Freq: Every day | ORAL | Status: DC
  Administered 2019-04-13 – 2019-04-16 (×3): 220 mg via ORAL
  Filled 2019-04-13 (×4): qty 1

## 2019-04-13 MED ORDER — LOPERAMIDE HCL 2 MG PO CAPS
4.0000 mg | ORAL_CAPSULE | ORAL | Status: DC | PRN
  Administered 2019-04-14: 18:00:00 4 mg via ORAL
  Filled 2019-04-13: qty 2

## 2019-04-13 MED ORDER — CHLORHEXIDINE GLUCONATE CLOTH 2 % EX PADS
6.0000 | MEDICATED_PAD | Freq: Every day | CUTANEOUS | Status: DC
  Administered 2019-04-15 – 2019-04-16 (×2): 6 via TOPICAL

## 2019-04-13 MED ORDER — SODIUM CHLORIDE 0.9% FLUSH
3.0000 mL | Freq: Two times a day (BID) | INTRAVENOUS | Status: DC
  Administered 2019-04-13 – 2019-04-16 (×6): 3 mL via INTRAVENOUS

## 2019-04-13 MED ORDER — GUAIFENESIN-DM 100-10 MG/5ML PO SYRP: 10.0000 mL | ORAL_SOLUTION | ORAL | Status: DC | PRN

## 2019-04-13 MED ORDER — ALBUTEROL SULFATE HFA 108 (90 BASE) MCG/ACT IN AERS
2.0000 | INHALATION_SPRAY | Freq: Four times a day (QID) | RESPIRATORY_TRACT | Status: DC
  Administered 2019-04-14 – 2019-04-16 (×7): 2 via RESPIRATORY_TRACT
  Filled 2019-04-13 (×2): qty 6.7

## 2019-04-13 MED ORDER — FLUTICASONE FUROATE-VILANTEROL 100-25 MCG/INH IN AEPB
1.0000 | INHALATION_SPRAY | Freq: Every day | RESPIRATORY_TRACT | Status: DC
  Administered 2019-04-14: 1 via RESPIRATORY_TRACT
  Filled 2019-04-13 (×2): qty 28

## 2019-04-13 MED ORDER — MIDODRINE HCL 5 MG PO TABS
5.0000 mg | ORAL_TABLET | ORAL | Status: DC
  Administered 2019-04-13 – 2019-04-15 (×2): 5 mg via ORAL
  Filled 2019-04-13 (×3): qty 1

## 2019-04-13 MED ORDER — HYDROCODONE-ACETAMINOPHEN 5-325 MG PO TABS
1.0000 | ORAL_TABLET | Freq: Four times a day (QID) | ORAL | Status: DC | PRN
  Administered 2019-04-13 – 2019-04-16 (×5): 1 via ORAL
  Filled 2019-04-13 (×5): qty 1

## 2019-04-13 MED ORDER — APIXABAN 2.5 MG PO TABS
2.5000 mg | ORAL_TABLET | Freq: Two times a day (BID) | ORAL | Status: DC
  Administered 2019-04-13 – 2019-04-16 (×5): 2.5 mg via ORAL
  Filled 2019-04-13 (×8): qty 1

## 2019-04-13 MED ORDER — FLUTICASONE-UMECLIDIN-VILANT 100-62.5-25 MCG/INH IN AEPB: 1.0000 | INHALATION_SPRAY | Freq: Every day | RESPIRATORY_TRACT | Status: DC

## 2019-04-13 NOTE — ED Notes (Signed)
Pt is c/o abd and chest pain  At present  On nasal 02  Heart rate is irregular bp not capturing because of that  Several attempts  Made to get bp unsuccessful

## 2019-04-13 NOTE — Consult Note (Addendum)
[facility] Renal Consultation Note  Indication for Consultation:  Management of ESRD/hemodialysis; anemia, hypertension/volume and secondary hyperparathyroidism  HPI: Natasha [name] is a 73 y.o. 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 d/c on coumadin, PCP managing INR.   Now presents in ER with worsening  SOB after previously presenting  Mon. 11/16 diagnosed with PNA  Started on antbiotoics felt somewhat better and was discharged . She missed her Monday HD tx and plans made for her  to attend hd Tues but she did not attend . She now presents back to ER with sob worsening despite Home O2 5L [place] . CXR   impression of Emphysema /no acute cardiopulmonary  Process K 3.7  hgb 11.1, wbc 5.2 pt is afebrile in ER    Above history obtained from ER records as Patient is COVID  positive . She is now admitted with COVID progressive SOB  We are consulted for Dialysis /ESRD issues with plan for HD tonight.  As above-  Pt in ER on Mon- PNA but COVID test not back and was discharged with a plan to go to HD on Tuesday.  She did not go on Tuesday or today- came here instead SOB-  O2 req seems to be increased.  Getting admitted for inpatient management of COVD.        Past Medical History:  Diagnosis Date  . Anal fistula   . Anemia in chronic kidney disease (CKD)   . Aortic insufficiency    Echo 3/18: mod conc LVH, EF 60-65, no RWMA, Gr 1 DD, mod AI, mild MR, normal RVSF, Trivial TR  . Arthritis   . Borderline hypertension   . Bulging disc    L3-L4  . Chronic diarrhea    due to crohn's  . CKD (chronic kidney disease), stage IV ([facility]    [facility]  . Crohn's disease ([facility]    chronic ileitis  . Dyspnea     with exertion  . Emphysema/COPD ([facility]   . GERD (gastroesophageal reflux disease)    denies  . History of blood transfusion   . History of glaucoma   . History of kidney stones   . History of pancreatitis    2008--  mercaptopurine  . History of small bowel obstruction    12-03-2010  due to crohn's ileitis  . Hypertension    no longer on medications  . Hypothyroidism, postsurgical    multinodule w/ hurthle cells  . Osteoporosis   . Perianal Crohn's disease ([facility]   . Peripheral vascular disease ([facility]    blood clot behind knee left leg  . Polyarthralgia   . Seizures ([facility]    03/2017  . Wears partial dentures    upper    Past Surgical History:  Procedure Laterality Date  . ABDOMINAL HYSTERECTOMY  1990   and  Appendectomy  . AV FISTULA PLACEMENT Left 12/08/2017   Procedure: INSERTION OF ARTERIOVENOUS (AV) GRAFT WITH ARTEGRAFT TO LEFT UPPER ARM;  Surgeon: CConrad Lennox MD;  [place]  Service: Vascular;  Laterality: Left;  . BASCILIC VEIN TRANSPOSITION Left 01/09/2017   Procedure: LEFT 1ST STAGE BRACHIAL VEIN TRANSPOSITION;  Surgeon: CConrad Tifton MD;  [place]  Service: Vascular;  Laterality: Left;  . BASCILIC VEIN TRANSPOSITION  Left 06/16/2017   Procedure: Second Stage BASILIC VEIN TRANSPOSITION  LEFT ARM;  Surgeon: Conrad Kyle, MD;  Location: [place];  Service: Vascular;  Laterality: Left;  . BIOPSY  03/09/2018   Procedure: BIOPSY;  Surgeon: Wilford Corner, MD;  [facility];  Service: Endoscopy;;  . CHOLECYSTECTOMY    . COLON RESECTION  x3 --  1978,  1987, 1989   ILEAL RESECTION x2/   [place]  . COLONOSCOPY WITH PROPOFOL N/A 09/25/2014   Procedure: COLONOSCOPY WITH PROPOFOL;  Surgeon: Garlan Fair, MD;  [facility];  Service: Endoscopy;  Laterality: N/A;  . COLONOSCOPY WITH PROPOFOL N/A 03/09/2018   Procedure: COLONOSCOPY WITH PROPOFOL;  Surgeon: Wilford Corner, MD;  [facility];  Service: Endoscopy;   Laterality: N/A;  . CYSTOSCOPY W/ URETERAL STENT PLACEMENT Right 11/19/2016   Procedure: CYSTOSCOPY WITH RIGHT RETROGRADE PYELOGRAM/ URETEROSCOPY WITH LASER AND RIGHT URETERAL STENT PLACEMENT;  Surgeon: Ardis Hughs, MD;  [facility];  Service: Urology;  Laterality: Right;  . ESOPHAGOGASTRODUODENOSCOPY  03/04/2012   Procedure: ESOPHAGOGASTRODUODENOSCOPY (EGD);  Surgeon: Garlan Fair, MD;  [facility];  Service: Endoscopy;  Laterality: N/A;  . EXAMINATION UNDER ANESTHESIA N/A 09/12/2014   Procedure: EXAM UNDER ANESTHESIA;  Surgeon: Rolm Bookbinder, MD;  Location: [place];  Service: General;  Laterality: N/A;  . EYE SURGERY     laser  . FLEXIBLE SIGMOIDOSCOPY  03/04/2012   Procedure: FLEXIBLE SIGMOIDOSCOPY;  Surgeon: Garlan Fair, MD;  [facility];  Service: Endoscopy;  Laterality: N/A;  . GLAUCOMA SURGERY Bilateral   . HOLMIUM LASER APPLICATION Right 7/90/2409   Procedure: HOLMIUM LASER APPLICATION;  Surgeon: Ardis Hughs, MD;  [facility];  Service: Urology;  Laterality: Right;  . INCISION AND DRAINAGE PERIRECTAL ABSCESS N/A 09/12/2014   Procedure: IRRIGATION AND DEBRIDEMENT PERIRECTAL ABSCESS;  Surgeon: Rolm Bookbinder, MD;  Location: [place];  Service: General;  Laterality: N/A;  . IR FLUORO GUIDE CV LINE RIGHT  03/20/2017  . IR US GUIDE VASC ACCESS RIGHT  03/20/2017  . NEGATIVE SLEEP STUDY  2008  . PLACEMENT OF SETON N/A 12/01/2014   Procedure: PLACEMENT OF SETON;  Surgeon: [name], MD;  Location[facility];  Service: General;  Laterality: N/A;  . POLYPECTOMY  03/09/2018   Procedure: POLYPECTOMY;  Surgeon: Wilford Corner, MD;  [facility];  Service: Endoscopy;;  . RECTAL EXAM UNDER ANESTHESIA N/A 12/01/2014   Procedure: RECTAL EXAM UNDER ANESTHESIA;  Surgeon: [name], MD;  Location: [facility];  Service: General;  Laterality: N/A;  . TOTAL THYROIDECTOMY  10-13-2003  . TRANSTHORACIC  ECHOCARDIOGRAM  07-11-2013   mild LVH,  ef 55%,  moderate AR,  mild MR and TR,  trivial PR  . TUBAL LIGATION        Family History  Problem Relation Age of Onset  . Hypertension Father   . Heart disease Father   . Heart attack Father   . Diverticulitis Mother   . COPD Mother   . Hypertension Mother   . Heart disease Mother   . Heart attack Mother   . Heart attack Brother   . Colon cancer Brother   . Diabetes Brother   . Thyroid disease Brother       reports that she quit smoking about 17 years ago. Her smoking use included cigarettes. She started smoking about 52 years ago. She has a 35.00 pack-year smoking history. She has never used smokeless tobacco. She  reports that she does not drink alcohol or use drugs.   Allergies  Allergen Reactions  . Mercaptopurine Other (See Comments)    Caused pancreatitis  . Remicade [Infliximab] Other (See Comments)    CAUSED JOINT PAIN    Prior to Admission medications   Medication Sig Start Date End Date Taking? Authorizing Provider  acetaminophen (TYLENOL) 500 MG tablet Take 1,000 mg by mouth every 6 (six) hours as needed for mild pain.    Yes [provider]  Adalimumab (HUMIRA) 40 MG/0.8ML PSKT Inject 40 mg into the skin every 14 (fourteen) days.    Yes [provider]  albuterol (VENTOLIN HFA) 108 (90 Base) MCG/ACT inhaler INHALE 2 PUFFS BY MOUTH EVERY 6 HOURS IF NEEDED FOR WHEEZING OR SHORTNESS OF BREATH 10/11/18  Yes Chesley Mires, MD  alendronate (FOSAMAX) 70 MG tablet Take 70 mg by mouth every Monday.  11/06/16  Yes [provider]  calcitRIOL (ROCALTROL) 0.25 MCG capsule Take 0.25 mcg by mouth every Monday, Wednesday, and Friday with hemodialysis. 02/03/17  Yes [provider]  calcium acetate (PHOSLO) 667 MG capsule Take 1 capsule (667 mg total) by mouth 3 (three) times daily with meals. 06/06/18  Yes Domenic Polite, MD  cyanocobalamin (,VITAMIN B-12,) 1000 MCG/ML injection Inject 1,000 mcg into the  muscle every 30 (thirty) days.    Yes [provider]  ELIQUIS 5 MG TABS tablet Take 2.5 mg by mouth 2 (two) times daily. Takes 1/2 tablet (2.38m) bid 08/23/18  Yes [provider]  fluticasone (FLONASE) 50 MCG/ACT nasal spray Place 1-2 sprays into both nostrils daily. 03/19/19  Yes [provider]  Fluticasone-Umeclidin-Vilant (TRELEGY ELLIPTA) 100-62.5-25 MCG/INH AEPB Inhale 1 puff into the lungs daily. 02/22/19  Yes MLauraine Rinne NP  Fluticasone-Umeclidin-Vilant (TRELEGY ELLIPTA) 100-62.5-25 MCG/INH AEPB Inhale 1 puff into the lungs daily. 03/29/19  Yes SChesley Mires MD  heparin 1000 UNIT/ML injection Inject 1,000 Units into the vein 3 (three) times a week. 12/01/18 11/30/19 Yes [provider]  ipratropium-albuterol (DUONEB) 0.5-2.5 (3) MG/3ML SOLN Take 3 mLs by nebulization every 6 (six) hours as needed. 12/30/18  Yes NFenton Foy NP  levothyroxine (SYNTHROID, LEVOTHROID) 125 MCG tablet Take 125 mcg by mouth daily before breakfast.    Yes [provider]  lidocaine-prilocaine (EMLA) cream Apply 1 application topically 3 (three) times a week.  11/29/18  Yes [provider]  loperamide (IMODIUM) 2 MG capsule Take 2 capsules (4 mg total) by mouth as needed for diarrhea or loose stools. 03/27/17  Yes GCaren Griffins MD  midodrine (PROAMATINE) 5 MG tablet Take 5 mg by mouth every Monday, Wednesday, and Friday. 5 mg PRIOR TO dialysis on Mon/Wed/Fri 04/30/17  Yes [provider]  nitroGLYCERIN (NITROSTAT) 0.4 MG SL tablet Place 1 tablet (0.4 mg total) under the tongue every 5 (five) minutes as needed for chest pain. 08/19/16 03/31/26 Yes Weaver, Scott T, PA-C  ondansetron (ZOFRAN ODT) 4 MG disintegrating tablet Take 1 tablet (4 mg total) by mouth every 8 (eight) hours as needed for nausea or vomiting. 03/29/18  Yes FDeno Etienne DO  OXYGEN Inhale 2-3 L into the lungs See admin instructions. Use every night and during the day as needed for shortness of  breath   Yes [provider]  predniSONE (DELTASONE) 5 MG tablet Take 5 mg by mouth daily with breakfast.   Yes [provider]  HYDROcodone-acetaminophen (NORCO) 5-325 MG tablet Take 1 tablet by mouth every 6 (six) hours as needed  for moderate pain. Patient not taking: Reported on 04/13/2019 12/08/17   Dagoberto Ligas, PA-C  predniSONE (DELTASONE) 10 MG tablet Take 10 mg by mouth as directed. DS 5 04/07/19   [provider]  Respiratory Therapy Supplies (NEBULIZER) DEVI 1 Device by Does not apply route as needed. 12/07/18   Fenton Foy, NP    PRN:  Results for orders placed or performed during the hospital encounter of 04/13/19 (from the past 48 hour(s))  Lactic acid, plasma     Status: None   Collection Time: 04/13/19 12:17 PM  Result Value Ref Range   Lactic Acid, Venous 0.6 0.5 - 1.9 mmol/L    Comment: Performed at [facility], [address], [place], [place] [address]  Basic metabolic panel     Status: Abnormal   Collection Time: 04/13/19 12:30 PM  Result Value Ref Range   Sodium 139 135 - 145 mmol/L   Potassium 3.7 3.5 - 5.1 mmol/L   Chloride 114 (H) 98 - 111 mmol/L   CO2 12 (L) 22 - 32 mmol/L   Glucose, Bld 77 70 - 99 mg/dL   BUN 68 (H) 8 - 23 mg/dL   Creatinine, Ser 11.56 (H) 0.44 - 1.00 mg/dL   Calcium 8.3 (L) 8.9 - 10.3 mg/dL   GFR calc non Af Amer 3 (L) >60 mL/min   GFR calc Af Amer 3 (L) >60 mL/min   Anion gap 13 5 - 15    Comment: Performed at [facility] [address], [place], [place] [address]  CBC with Differential     Status: Abnormal   Collection Time: 04/13/19 12:30 PM  Result Value Ref Range   WBC 5.2 4.0 - 10.5 K/uL   RBC 3.46 (L) 3.87 - 5.11 MIL/uL   Hemoglobin 11.1 (L) 12.0 - 15.0 g/dL   HCT 38.1 36.0 - 46.0 %   MCV 110.1 (H) 80.0 - 100.0 fL   MCH 32.1 26.0 - 34.0 pg   MCHC 29.1 (L) 30.0 - 36.0 g/dL   RDW 16.6 (H) 11.5 - 15.5 %   Platelets 117 (L) 150 - 400 K/uL    Comment: REPEATED TO VERIFY PLATELET  COUNT CONFIRMED BY SMEAR Immature Platelet Fraction may be clinically indicated, consider ordering this additional test [id number]    nRBC 0.4 (H) 0.0 - 0.2 %   Neutrophils Relative % 72 %   Neutro Abs 3.7 1.7 - 7.7 K/uL   Lymphocytes Relative 15 %   Lymphs Abs 0.8 0.7 - 4.0 K/uL   Monocytes Relative 11 %   Monocytes Absolute 0.6 0.1 - 1.0 K/uL   Eosinophils Relative 1 %   Eosinophils Absolute 0.1 0.0 - 0.5 K/uL   Basophils Relative 0 %   Basophils Absolute 0.0 0.0 - 0.1 K/uL   RBC Morphology BURR CELLS     Comment: POLYCHROMASIA PRESENT   Immature Granulocytes 1 %   Abs Immature Granulocytes 0.03 0.00 - 0.07 K/uL    Comment: Performed at [facility], [place] [address], [place],  [address]   .  ROS: see hpi / ER ROS records    Physical Exam: Vitals:   04/13/19 1445 04/13/19 1500  BP: (!) 101/44 93/65  Pulse:    Resp: (!) 21 (!) 21  Temp:    SpO2:       As patient is still in ER and COVID Post ive   PE notes  =Reviewed as per ER Staff    Dialysis  Orders: Center: [facility]   on mwf . EDW 51.0 HD Bath 3k/2.25Ca  Time 4hr Heparin 4200.   LUA AVGG     Hectorol 3.70mg IV/HD  Mircera 75 mcg q 2wks ( last given 04/04/19)   Venofer 100 mg loading x 10 (given 2 @ op Kid center)    Assessment/Plan 1. SOB with COVID positive ( dyspnea despite home O2 for COPD)-  2. ESRD -  Plan for HD tonight when room obtained (MWF schedule ) k 3.7 . Since has not had HD since Friday should look to do HD when can.  Orders in-  Will depend on when night nurse is done with other patients and when Ms. FWalthouris in room 3. Hypertension/volume  - bp stable , no wt yet / no vol reported on CXR  , plans for hd today  ,prob lower edw with hwt loss with covid  Fu bps /wts / uses midodrine  4. Anemia  Of ESRD - hgb  11.1  No current esa needs  5. Metabolic bone disease -  Po vit d on hd / binders with meals  6. COPD - on inhaler/ meds   DErnest Haber PA-C [facility]  [id number]/18/2020, 3:54 PM   Patient seen and examined, agree with above note with above modifications. 73year old BF-- ESRD.  Was in ER on MOnday-  Found to have PNA but stable for discharge and were going to do OP abx at OP center.  She failed that plan- never went to HD and now presents with worsening SOB-  Test onMonday for COVID was positive.  Being admitted, will get HD tonight at some point.  Once discharged [facility]   KCorliss Parish MD 04/13/2019

## 2019-04-13 NOTE — ED Notes (Signed)
The pt has been talking to friends and family via phone

## 2019-04-13 NOTE — ED Notes (Signed)
The pt wants has pain everywhere  She does not want the cough medicine that has a narcotic ingredient.

## 2019-04-13 NOTE — ED Notes (Signed)
Meal tray delivered.

## 2019-04-13 NOTE — ED Provider Notes (Signed)
[facility] EMERGENCY DEPARTMENT Provider Note   CSN: [id number] Arrival date & time: 04/13/19  1014     History   Chief Complaint Chief Complaint  Patient presents with  . +Cov/ SOB    HPI Natasha [name] is a 73 y.o. female.     73 yo female with history of COPD, ESRD on dialysis (Monday, Wednesday, Friday) progressively worsening SHOB over the past week. Patient came to the ER Monday, diagnosed with PNA and started on abx, felt a little better, symptoms worse since last night. Missed dialysis Monday and Wednesday (today)- last went on Friday last week. On 4L [facility] at baseline, home bottle only goes up to 5L and does not feel like this is enough.   Goes to [facility] was evaluated in Emergency Department on 04/13/2019 for the symptoms described in the history of present illness. She was evaluated in the context of the global COVID-19 pandemic, which necessitated consideration that the patient might be at risk for infection with the SARS-CoV-2 virus that causes COVID-19. Institutional protocols and algorithms that pertain to the evaluation of patients at risk for COVID-19 are in a state of rapid change based on information released by regulatory bodies including the CDC and federal and state organizations. These policies and algorithms were followed during the patient's care in the ED.      Past Medical History:  Diagnosis Date  . Anal fistula   . Anemia in chronic kidney disease (CKD)   . Aortic insufficiency    Echo 3/18: mod conc LVH, EF 60-65, no RWMA, Gr 1 DD, mod AI, mild MR, normal RVSF, Trivial TR  . Arthritis   . Borderline hypertension   . Bulging disc    L3-L4  . Chronic diarrhea    due to crohn's  . CKD (chronic kidney disease), stage IV ([place])    MWF- [address]  . Crohn's disease ([place])    chronic ileitis  . Dyspnea    with exertion  . Emphysema/COPD ([place])   . GERD (gastroesophageal reflux disease)    denies  .  History of blood transfusion   . History of glaucoma   . History of kidney stones   . History of pancreatitis    2008--  mercaptopurine  . History of small bowel obstruction    12-03-2010  due to crohn's ileitis  . Hypertension    no longer on medications  . Hypothyroidism, postsurgical    multinodule w/ hurthle cells  . Osteoporosis   . Perianal Crohn's disease ([place])   . Peripheral vascular disease ([place])    blood clot behind knee left leg  . Polyarthralgia   . Seizures ([place])    03/2017  . Wears partial dentures    upper    Patient Active Problem List   Diagnosis Date Noted  . Medication management 02/22/2019  . Chronic respiratory failure with hypoxia ([place]) 09/23/2018  . Hx of Pulmonary embolism ([place]) 05/28/2018  . COPD with acute exacerbation ([place]) 03/31/2018  . Acute sinusitis 03/31/2018  . Crohn's disease of colon with complication ([place])   . Aortic insufficiency 07/30/2016  . Atherosclerosis of aorta ([place]) 07/30/2016  . Pressure injury of skin 07/14/2016  . Gastroenteritis, acute 07/12/2016  . COPD (chronic obstructive pulmonary disease) ([place]) 10/12/2015  . Malnutrition of moderate degree ([place]) 09/13/2014  . history of Left leg DVT 09/10/2014  . Hypotension 09/10/2014  . Hypoglycemia 09/10/2014  . Ischiorectal abscess 09/09/2014  . Increased  anion gap metabolic acidosis   . Generalized abdominal pain   . Chronic diastolic CHF (congestive heart failure) ([place])   . Hypertensive heart disease   . Secondary hyperparathyroidism ([place])   . Other specified hypothyroidism   . Right shoulder pain   . Thrombocytopenia ([place])   . Severe protein-calorie malnutrition ([place])   . Thyroid activity decreased   . Emphysema of lung ([place])   . Nephrolithiasis 03/29/2014  . Spinal stenosis of lumbar region 11/01/2013  . Bilateral leg pain 09/28/2013  . Emphysema/COPD ([place])   . Acute dyspnea 09/20/2013  . Protein-calorie malnutrition, severe ([place]) 05/14/2013  . Salmonella  enteritidis 06/11/2012  . Hypocalcemia 06/10/2012  . Hypothyroidism 06/10/2012  . Anemia of chronic disease 06/09/2012  . Hypomagnesemia 06/09/2012  . ESRD on dialysis ([place]) 06/09/2012  . Dehydration 06/07/2012  . Crohn's disease without complication ([place]) 59/93/5701  . Metabolic acidosis 77/93/9030  . Hypokalemia 06/07/2012  . Nausea vomiting and diarrhea 06/07/2012  . UTI (lower urinary tract infection) 06/07/2012    Past Surgical History:  Procedure Laterality Date  . ABDOMINAL HYSTERECTOMY  1990   and  Appendectomy  . AV FISTULA PLACEMENT Left 12/08/2017   Procedure: INSERTION OF ARTERIOVENOUS (AV) GRAFT WITH ARTEGRAFT TO LEFT UPPER ARM;  Surgeon: Conrad La [place], MD;  Location: [facility];  Service: Vascular;  Laterality: Left;  . BASCILIC VEIN TRANSPOSITION Left 01/09/2017   Procedure: LEFT 1ST STAGE BRACHIAL VEIN TRANSPOSITION;  Surgeon: Conrad Maricopa, MD;  Location: [facility];  Service: Vascular;  Laterality: Left;  . BASCILIC VEIN TRANSPOSITION Left 06/16/2017   Procedure: Second Stage BASILIC VEIN TRANSPOSITION  LEFT ARM;  Surgeon: Conrad Morrison, MD;  Location: [facility];  Service: Vascular;  Laterality: Left;  . BIOPSY  03/09/2018   Procedure: BIOPSY;  Surgeon: Wilford Corner, MD;  Location: [facility];  Service: Endoscopy;;  . CHOLECYSTECTOMY    . COLON RESECTION  x3 --  1978,  1987, 1989   ILEAL RESECTION x2/   [place]  . COLONOSCOPY WITH PROPOFOL N/A 09/25/2014   Procedure: COLONOSCOPY WITH PROPOFOL;  Surgeon: Garlan Fair, MD;  [facility];  Service: Endoscopy;  Laterality: N/A;  . COLONOSCOPY WITH PROPOFOL N/A 03/09/2018   Procedure: COLONOSCOPY WITH PROPOFOL;  Surgeon: Wilford Corner, MD;  [facility];  Service: Endoscopy;  Laterality: N/A;  . CYSTOSCOPY W/ URETERAL STENT PLACEMENT Right 11/19/2016   Procedure: CYSTOSCOPY WITH RIGHT RETROGRADE PYELOGRAM/ URETEROSCOPY WITH LASER AND RIGHT URETERAL STENT PLACEMENT;  Surgeon: Ardis Hughs, MD;  [facility];  Service: Urology;  Laterality: Right;  . ESOPHAGOGASTRODUODENOSCOPY  03/04/2012   Procedure: ESOPHAGOGASTRODUODENOSCOPY (EGD);  Surgeon: Garlan Fair, MD;  Location: [facility];  Service: Endoscopy;  Laterality: N/A;  . EXAMINATION UNDER ANESTHESIA N/A 09/12/2014   Procedure: EXAM UNDER ANESTHESIA;  Surgeon: Rolm Bookbinder, MD;  Location: [facility];  Service: General;  Laterality: N/A;  . EYE SURGERY     laser  . FLEXIBLE SIGMOIDOSCOPY  03/04/2012   Procedure: FLEXIBLE SIGMOIDOSCOPY;  Surgeon: Garlan Fair, MD;  [facility];  Service: Endoscopy;  Laterality: N/A;  . GLAUCOMA SURGERY Bilateral   . HOLMIUM LASER APPLICATION Right 0/92/3300   Procedure: HOLMIUM LASER APPLICATION;  Surgeon: Ardis Hughs, MD;  [facility];  Service: Urology;  Laterality: Right;  . INCISION AND DRAINAGE PERIRECTAL ABSCESS N/A 09/12/2014   Procedure: IRRIGATION AND DEBRIDEMENT PERIRECTAL ABSCESS;  Surgeon: Rolm Bookbinder, MD;  Location: [facility];  Service: General;  Laterality:  N/A;  . IR FLUORO GUIDE CV LINE RIGHT  03/20/2017  . IR US GUIDE VASC ACCESS RIGHT  03/20/2017  . NEGATIVE SLEEP STUDY  2008  . PLACEMENT OF SETON N/A 12/01/2014   Procedure: PLACEMENT OF SETON;  Surgeon: [name], MD;  Location: [facility];  Service: General;  Laterality: N/A;  . POLYPECTOMY  03/09/2018   Procedure: POLYPECTOMY;  Surgeon: Wilford Corner, MD;  [facility];  Service: Endoscopy;;  . RECTAL EXAM UNDER ANESTHESIA N/A 12/01/2014   Procedure: RECTAL EXAM UNDER ANESTHESIA;  Surgeon: [name], MD;  Location: [facility];  Service: General;  Laterality: N/A;  . TOTAL THYROIDECTOMY  10-13-2003  . TRANSTHORACIC ECHOCARDIOGRAM  07-11-2013   mild LVH,  ef 55%,  moderate AR,  mild MR and TR,  trivial PR  . TUBAL LIGATION       OB History   No obstetric history on file.      Home Medications    Prior to Admission  medications   Medication Sig Start Date End Date Taking? Authorizing Provider  acetaminophen (TYLENOL) 500 MG tablet Take 1,000 mg by mouth every 6 (six) hours as needed for mild pain.    Yes [provider]  Adalimumab (HUMIRA) 40 MG/0.8ML PSKT Inject 40 mg into the skin every 14 (fourteen) days.    Yes [provider]  albuterol (VENTOLIN HFA) 108 (90 Base) MCG/ACT inhaler INHALE 2 PUFFS BY MOUTH EVERY 6 HOURS IF NEEDED FOR WHEEZING OR SHORTNESS OF BREATH 10/11/18  Yes Chesley Mires, MD  alendronate (FOSAMAX) 70 MG tablet Take 70 mg by mouth every Monday.  11/06/16  Yes [provider]  calcitRIOL (ROCALTROL) 0.25 MCG capsule Take 0.25 mcg by mouth every Monday, Wednesday, and Friday with hemodialysis. 02/03/17  Yes [provider]  calcium acetate (PHOSLO) 667 MG capsule Take 1 capsule (667 mg total) by mouth 3 (three) times daily with meals. 06/06/18  Yes Domenic Polite, MD  cyanocobalamin (,VITAMIN B-12,) 1000 MCG/ML injection Inject 1,000 mcg into the muscle every 30 (thirty) days.    Yes [provider]  ELIQUIS 5 MG TABS tablet Take 2.5 mg by mouth 2 (two) times daily. Takes 1/2 tablet (2.23m) bid 08/23/18  Yes [provider]  fluticasone (FLONASE) 50 MCG/ACT nasal spray Place 1-2 sprays into both nostrils daily. 03/19/19  Yes [provider]  Fluticasone-Umeclidin-Vilant (TRELEGY ELLIPTA) 100-62.5-25 MCG/INH AEPB Inhale 1 puff into the lungs daily. 02/22/19  Yes MLauraine Rinne NP  Fluticasone-Umeclidin-Vilant (TRELEGY ELLIPTA) 100-62.5-25 MCG/INH AEPB Inhale 1 puff into the lungs daily. 03/29/19  Yes SChesley Mires MD  heparin 1000 UNIT/ML injection Inject 1,000 Units into the vein 3 (three) times a week. 12/01/18 11/30/19 Yes [provider]  ipratropium-albuterol (DUONEB) 0.5-2.5 (3) MG/3ML SOLN Take 3 mLs by nebulization every 6 (six) hours as needed. 12/30/18  Yes NFenton Foy NP  levothyroxine (SYNTHROID, LEVOTHROID) 125 MCG  tablet Take 125 mcg by mouth daily before breakfast.    Yes [provider]  lidocaine-prilocaine (EMLA) cream Apply 1 application topically 3 (three) times a week.  11/29/18  Yes [provider]  loperamide (IMODIUM) 2 MG capsule Take 2 capsules (4 mg total) by mouth as needed for diarrhea or loose stools. 03/27/17  Yes GCaren Griffins MD  midodrine (PROAMATINE) 5 MG tablet Take 5 mg by mouth every Monday, Wednesday, and Friday. 5 mg PRIOR TO dialysis on Mon/Wed/Fri 04/30/17  Yes [provider]  nitroGLYCERIN (NITROSTAT) 0.4  MG SL tablet Place 1 tablet (0.4 mg total) under the tongue every 5 (five) minutes as needed for chest pain. 08/19/16 03/31/26 Yes Weaver, Scott T, PA-C  ondansetron (ZOFRAN ODT) 4 MG disintegrating tablet Take 1 tablet (4 mg total) by mouth every 8 (eight) hours as needed for nausea or vomiting. 03/29/18  Yes Deno Etienne, DO  OXYGEN Inhale 2-3 L into the lungs See admin instructions. Use every night and during the day as needed for shortness of breath   Yes [provider]  predniSONE (DELTASONE) 5 MG tablet Take 5 mg by mouth daily with breakfast.   Yes [provider]  HYDROcodone-acetaminophen (NORCO) 5-325 MG tablet Take 1 tablet by mouth every 6 (six) hours as needed for moderate pain. Patient not taking: Reported on 04/13/2019 12/08/17   Dagoberto Ligas, PA-C  predniSONE (DELTASONE) 10 MG tablet Take 10 mg by mouth as directed. DS 5 04/07/19   [provider]  Respiratory Therapy Supplies (NEBULIZER) DEVI 1 Device by Does not apply route as needed. 12/07/18   Fenton Foy, NP    Family History Family History  Problem Relation Age of Onset  . Hypertension Father   . Heart disease Father   . Heart attack Father   . Diverticulitis Mother   . COPD Mother   . Hypertension Mother   . Heart disease Mother   . Heart attack Mother   . Heart attack Brother   . Colon cancer Brother   . Diabetes Brother   . Thyroid  disease Brother     Social History Social History   Tobacco Use  . Smoking status: Former Smoker    Packs/day: 1.00    Years: 35.00    Pack years: 35.00    Types: Cigarettes    Start date: 02/22/1967    Quit date: 11/27/2001    Years since quitting: 17.3  . Smokeless tobacco: Never Used  Substance Use Topics  . Alcohol use: No  . Drug use: No     Allergies   Mercaptopurine and Remicade [infliximab]   Review of Systems Review of Systems  Constitutional: Negative for fever.  Respiratory: Positive for cough and shortness of breath.        Cough productive- green/yellow  Gastrointestinal: Positive for abdominal pain. Negative for constipation, diarrhea, nausea and vomiting.  Genitourinary: Negative for dysuria.  Musculoskeletal: Positive for arthralgias and myalgias.  Skin: Negative for rash and wound.  All other systems reviewed and are negative.    Physical Exam Updated Vital Signs BP 93/65   Pulse (!) 105   Temp 98.3 F (36.8 C) (Oral)   Resp (!) 21   SpO2 100%   Physical Exam Vitals signs and nursing note reviewed.  Constitutional:      Appearance: Normal appearance.  HENT:     Head: Normocephalic and atraumatic.  Cardiovascular:     Rate and Rhythm: Regular rhythm. Tachycardia present.     Heart sounds: Normal heart sounds.  Pulmonary:     Effort: Pulmonary effort is normal.  Abdominal:     Palpations: Abdomen is soft.     Tenderness: There is no abdominal tenderness.  Musculoskeletal:     Right lower leg: No edema.     Left lower leg: No edema.  Skin:    General: Skin is warm and dry.     Findings: No erythema or rash.  Neurological:     Mental Status: She is alert and oriented to person, place, and time.  Psychiatric:        Behavior: Behavior normal.      ED Treatments / Results  Labs (all labs ordered are listed, but only abnormal results are displayed) Labs Reviewed  BASIC METABOLIC PANEL - Abnormal; Notable for the following  components:      Result Value   Chloride 114 (*)    CO2 12 (*)    BUN 68 (*)    Creatinine, Ser 11.56 (*)    Calcium 8.3 (*)    GFR calc non Af Amer 3 (*)    GFR calc Af Amer 3 (*)    All other components within normal limits  CBC WITH DIFFERENTIAL/PLATELET - Abnormal; Notable for the following components:   RBC 3.46 (*)    Hemoglobin 11.1 (*)    MCV 110.1 (*)    MCHC 29.1 (*)    RDW 16.6 (*)    Platelets 117 (*)    nRBC 0.4 (*)    All other components within normal limits  LACTIC ACID, PLASMA    EKG EKG Interpretation  Date/Time:  Wednesday April 13 2019 12:11:46 EST Ventricular Rate:  57 PR Interval:    QRS Duration: 81 QT Interval:  394 QTC Calculation: 384 R Axis:   33 Text Interpretation: Sinus rhythm Borderline short PR interval Left ventricular hypertrophy Anterior infarct, old Nonspecific T abnormalities, lateral leads no stemi Confirmed by Nanda Quinton [phone]) on 04/13/2019 12:32:06 PM   Radiology No results found.  Procedures Procedures (including critical care time)  Medications Ordered in ED Medications  ondansetron (ZOFRAN) injection 4 mg (4 mg Intravenous Given 04/13/19 1221)  morphine 4 MG/ML injection 4 mg (4 mg Intravenous Given 04/13/19 1353)     Initial Impression / Assessment and Plan / ED Course  I have reviewed the triage vital signs and the nursing notes.  Pertinent labs & imaging results that were available during my care of the patient were reviewed by me and considered in my medical decision making (see chart for details).  Clinical Course as of Apr 12 1533  Wed Nov 18, 375  [id number] 73 year old female with possible history of end-stage renal disease on dialysis, COPD on home O2 to the base oxygen of 4 L nasal cannula, PE- Eliquis, recently diagnosed with pneumonia and now Covid positive.  Patient states that she felt a little better Monday after starting antibiotics however is having worsening shortness of breath despite turning her  oxygen up as high as it will go to 5 L.  Reports ongoing cough and body aches, no longer having diarrhea.  Patient has not been to dialysis since last Friday, she was in the ER on Monday missed dialysis, was told to go on Tuesday however has not felt well and skipped dialysis again today. On exam patient is slightly tachypneic and tachycardic. Review of labs, CBC without significant changes, BMP with increase in creatinine from 9-11.56, potassium within normal limits.  Lactic acid x1 normal.   [LM]  1452 Case discussed with Dr. Laverta Baltimore, ER attending, plan is to consult hospitalist for admission, concern for patient with chronic health conditions, not going to dialysis, with COVID and worsening SHOB.   [LM]  8675 Chest x-ray report has not crossed over into epic, impression no acute cardiopulmonary process, emphysema.   [LM]  1533 Case discussed with hospitalist, would like nephrology consulted to arrange dialysis while patient is admitted. Case discussed with Dr. Moshe Cipro with [facility], patient is at [facility] can receive dialysis once she gets  to a room.   [LM]    Clinical Course User Index [LM] Tacy Learn, PA-C      Final Clinical Impressions(s) / ED Diagnoses   Final diagnoses:  COVID-19  ESRD (end stage renal disease) ([place])  Pulmonary emphysema, unspecified emphysema type [facility])    ED Discharge Orders    None       Roque Lias 04/13/19 1535    Long, Wonda Olds, MD 04/14/19 1407

## 2019-04-13 NOTE — ED Notes (Signed)
Ordered diet tray 

## 2019-04-13 NOTE — ED Notes (Signed)
Hemodialysis RN notified this RN that dialysis is unable to be done unless pt has private room on floor. Hemodialysis to be called when pt has room.

## 2019-04-13 NOTE — Progress Notes (Signed)
Spoke to floor RN we are aware of Hemodialysis orders for patient but unable to initiate treatment until patient  gets a room.

## 2019-04-13 NOTE — ED Notes (Signed)
ED Provider at bedside. 

## 2019-04-13 NOTE — ED Notes (Signed)
Radiology contacted in regards to portable chest xray not having resulted since 1148, brooke in radiology advised she would check on status of xray results.

## 2019-04-13 NOTE — ED Notes (Signed)
C/o everything  Didn't want to take the meds  On ana empty stomach  Wants to go to the br  Bedside commode  Placed at   The bedside

## 2019-04-13 NOTE — H&P (Signed)
History and Physical    Natasha [name] JFH:[id number] DOB: 20-Jul-1945 DOA: 04/13/2019  Referring MD/NP/PA: Suella Broad, PA-C PCP: [name], MD  Patient coming from: Home via EMS  Chief Complaint: Shortness of breath  I have personally briefly reviewed patient's old medical records in [name]   HPI: Natasha [name] is a 73 y.o. female with medical history significant of ESRD on HD(M/W/F), Crohn's disease, history of PE on chronic anticoagulation, hypothyroidism, COPD, oxygen dependent on 3 to 4 L, and anemia of chronic disease.  She presents with complaints of progressively worsening shortness of breath over the last week. Symptoms started early last week with complaints of headache, myalgias, nausea, vomiting, diarrhea, lower back pain, and productive cough.  She reported initially coughing up clear sputum, but it had changed to yellowish-green color.  Her last hemodialysis session was 5 days ago.  Denies any significant fever.  She was seen in the emergency department 2 days or symptoms and diagnosed with pneumonia.  While in the ED she was given vancomycin and cefepime.  After talks with nephrology they had set her up to receive [facility] during dialysis.  However, after getting home patient reports that she felt worse and later told that she was Covid positive.  She had reported having diarrhea up to 7 times a day, but it appeared to resolve after being seen in the emergency department.  She was titrating her oxygen up to the highest level without relief of her symptoms.  Patient notes that her grandson had been coming over and his mother had recently tested positive for Covid.    ED Course: Upon admission into the emergency department patient was noted to be afebrile, pulse 105, respirations 13-25, blood pressures maintained, and O2 saturations 100% on 4 L of nasal cannula oxygen.  Labs revealed WBC 5.2, hemoglobin 11.1 , platelets 117, BUN 68, creatinine 11.56, and  glucose 77.  Chest x-ray showed edema without no acute abnormality, but just 2 days ago there was concern for right lower lobe pneumonia.  Nephrology was consulted and we will dialyze the patient when she gets to a room.  TRH called to admit.  Review of systems: A complete 10 point review of systems was performed and negative except for as noted above in the HPI.  Past Medical History:  Diagnosis Date  . Anal fistula   . Anemia in chronic kidney disease (CKD)   . Aortic insufficiency    Echo 3/18: mod conc LVH, EF 60-65, no RWMA, Gr 1 DD, mod AI, mild MR, normal RVSF, Trivial TR  . Arthritis   . Borderline hypertension   . Bulging disc    L3-L4  . Chronic diarrhea    due to crohn's  . CKD (chronic kidney disease), stage IV ([place])    MWF- [name]  . Crohn's disease (Natasha [name])    chronic ileitis  . Dyspnea    with exertion  . Emphysema/COPD ([facility]asha [name])   . GERD (gastroesophageal reflux disease)    denies  . History of blood transfusion   . History of glaucoma   . History of kidney stones   . History of pancreatitis    2008--  mercaptopurine  . History of small bowel obstruction    12-03-2010  due to crohn's ileitis  . Hypertension    no longer on medications  . Hypothyroidism, postsurgical    multinodule w/ hurthle cells  . Osteoporosis   . Perianal Crohn's disease (Natasha [name])   . Peripheral vascular  disease (Natasha [name])    blood clot behind knee left leg  . Polyarthralgia   . Seizures (Natasha [name])    03/2017  . Wears partial dentures    upper    Past Surgical History:  Procedure Laterality Date  . ABDOMINAL HYSTERECTOMY  1990   and  Appendectomy  . AV FISTULA PLACEMENT Left 12/08/2017   Procedure: INSERTION OF ARTERIOVENOUS (AV) GRAFT WITH ARTEGRAFT TO LEFT UPPER ARM;  Surgeon: Conrad Cadiz, MD;  Location: [facility];  Service: Vascular;  Laterality: Left;  . BASCILIC VEIN TRANSPOSITION Left 01/09/2017   Procedure: LEFT 1ST STAGE BRACHIAL VEIN TRANSPOSITION;  Surgeon: Conrad Jarrell, MD;   Location: [place];  Service: Vascular;  Laterality: Left;  . BASCILIC VEIN TRANSPOSITION Left 06/16/2017   Procedure: Second Stage BASILIC VEIN TRANSPOSITION  LEFT ARM;  Surgeon: Conrad Muscatine, MD;  Location: [name];  Service: Vascular;  Laterality: Left;  . BIOPSY  03/09/2018   Procedure: BIOPSY;  Surgeon: Wilford Corner, MD;  Location: [name];  Service: [name];;  . CHOLECYSTECTOMY    . COLON RESECTION  x3 --  1978,  1987, 1989   ILEAL RESECTION x2/   Natasha [name]  . COLONOSCOPY WITH PROPOFOL N/A 09/25/2014   Procedure: COLONOSCOPY WITH PROPOFOL;  Surgeon: Garlan Fair, MD;  Location: [name];  Service: [name];  Laterality: N/A;  . COLONOSCOPY WITH PROPOFOL N/A 03/09/2018   Procedure: COLONOSCOPY WITH PROPOFOL;  Surgeon: Wilford Corner, MD;  Location: [name];  Service: [name];  Laterality: N/A;  . CYSTOSCOPY W/ URETERAL STENT PLACEMENT Right 11/19/2016   Procedure: CYSTOSCOPY WITH RIGHT RETROGRADE PYELOGRAM/ URETEROSCOPY WITH LASER AND RIGHT URETERAL STENT PLACEMENT;  Surgeon: Ardis Hughs, MD;  [facility]: [facility];  Service: Urology;  Laterality: Right;  . ESOPHAGOGASTRODUODENOSCOPY  03/04/2012   Procedure: ESOPHAGOGASTRODUODENOSCOPY (EGD);  Surgeon: Garlan Fair, MD;  [facility]: [name];  Service: [name];  Laterality: N/A;  . EXAMINATION UNDER ANESTHESIA N/A 09/12/2014   Procedure: EXAM UNDER ANESTHESIA;  Surgeon: Rolm Bookbinder, MD;  Location: [name];  Service: General;  Laterality: N/A;  . EYE SURGERY     laser  . FLEXIBLE SIGMOIDOSCOPY  03/04/2012   Procedure: FLEXIBLE SIGMOIDOSCOPY;  Surgeon: Garlan Fair, MD;  Location: [name];  Service: [name];  Laterality: N/A;  . GLAUCOMA SURGERY Bilateral   . HOLMIUM LASER APPLICATION Right 1/61/0960   Procedure: HOLMIUM LASER APPLICATION;  Surgeon: Ardis Hughs, MD;  Location: [facility];  Service: Urology;  Laterality: Right;  . INCISION AND DRAINAGE PERIRECTAL ABSCESS N/A  09/12/2014   Procedure: IRRIGATION AND DEBRIDEMENT PERIRECTAL ABSCESS;  Surgeon: Rolm Bookbinder, MD;  Location: [name];  Service: General;  Laterality: N/A;  . IR FLUORO GUIDE CV LINE RIGHT  03/20/2017  . IR US GUIDE VASC ACCESS RIGHT  03/20/2017  . NEGATIVE SLEEP STUDY  2008  . PLACEMENT OF SETON N/A 12/01/2014   Procedure: PLACEMENT OF SETON;  Surgeon: [name], MD;  Location[facility];  Service: General;  Laterality: N/A;  . POLYPECTOMY  03/09/2018   Procedure: POLYPECTOMY;  Surgeon: Wilford Corner, MD;  Location: [name];  Service: [name];;  . RECTAL EXAM UNDER ANESTHESIA N/A 12/01/2014   Procedure: RECTAL EXAM UNDER ANESTHESIA;  Surgeon: [name], MD;  Location: [facility];  Service: General;  Laterality: N/A;  . TOTAL THYROIDECTOMY  10-13-2003  . TRANSTHORACIC ECHOCARDIOGRAM  07-11-2013   mild LVH,  ef 55%,  moderate AR,  mild MR and TR,  trivial  PR  . TUBAL LIGATION       reports that she quit smoking about 17 years ago. Her smoking use included cigarettes. She started smoking about 52 years ago. She has a 35.00 pack-year smoking history. She has never used smokeless tobacco. She reports that she does not drink alcohol or use drugs.  Allergies  Allergen Reactions  . Mercaptopurine Other (See Comments)    Caused pancreatitis  . Remicade [Infliximab] Other (See Comments)    CAUSED JOINT PAIN    Family History  Problem Relation Age of Onset  . Hypertension Father   . Heart disease Father   . Heart attack Father   . Diverticulitis Mother   . COPD Mother   . Hypertension Mother   . Heart disease Mother   . Heart attack Mother   . Heart attack Brother   . Colon cancer Brother   . Diabetes Brother   . Thyroid disease Brother     Prior to Admission medications   Medication Sig Start Date End Date Taking? Authorizing Provider  acetaminophen (TYLENOL) 500 MG tablet Take 1,000 mg by mouth every 6 (six) hours as needed for  mild pain.    Yes [provider]  Adalimumab (HUMIRA) 40 MG/0.8ML PSKT Inject 40 mg into the skin every 14 (fourteen) days.    Yes [provider]  albuterol (VENTOLIN HFA) 108 (90 Base) MCG/ACT inhaler INHALE 2 PUFFS BY MOUTH EVERY 6 HOURS IF NEEDED FOR WHEEZING OR SHORTNESS OF BREATH 10/11/18  Yes Chesley Mires, MD  alendronate (FOSAMAX) 70 MG tablet Take 70 mg by mouth every Monday.  11/06/16  Yes [provider]  calcitRIOL (ROCALTROL) 0.25 MCG capsule Take 0.25 mcg by mouth every Monday, Wednesday, and Friday with hemodialysis. 02/03/17  Yes [provider]  calcium acetate (PHOSLO) 667 MG capsule Take 1 capsule (667 mg total) by mouth 3 (three) times daily with meals. 06/06/18  Yes Domenic Polite, MD  cyanocobalamin (,VITAMIN B-12,) 1000 MCG/ML injection Inject 1,000 mcg into the muscle every 30 (thirty) days.    Yes [provider]  ELIQUIS 5 MG TABS tablet Take 2.5 mg by mouth 2 (two) times daily. Takes 1/2 tablet (2.59m) bid 08/23/18  Yes [provider]  fluticasone (FLONASE) 50 MCG/ACT nasal spray Place 1-2 sprays into both nostrils daily. 03/19/19  Yes [provider]  Fluticasone-Umeclidin-Vilant (TRELEGY ELLIPTA) 100-62.5-25 MCG/INH AEPB Inhale 1 puff into the lungs daily. 02/22/19  Yes MLauraine Rinne NP  Fluticasone-Umeclidin-Vilant (TRELEGY ELLIPTA) 100-62.5-25 MCG/INH AEPB Inhale 1 puff into the lungs daily. 03/29/19  Yes SChesley Mires MD  heparin 1000 UNIT/ML injection Inject 1,000 Units into the vein 3 (three) times a week. 12/01/18 11/30/19 Yes [provider]  ipratropium-albuterol (DUONEB) 0.5-2.5 (3) MG/3ML SOLN Take 3 mLs by nebulization every 6 (six) hours as needed. 12/30/18  Yes NFenton Foy NP  levothyroxine (SYNTHROID, LEVOTHROID) 125 MCG tablet Take 125 mcg by mouth daily before breakfast.    Yes [provider]  lidocaine-prilocaine (EMLA) cream Apply 1 application topically 3 (three) times a week.   11/29/18  Yes [provider]  loperamide (IMODIUM) 2 MG capsule Take 2 capsules (4 mg total) by mouth as needed for diarrhea or loose stools. 03/27/17  Yes GCaren Griffins MD  midodrine (PROAMATINE) 5 MG tablet Take 5 mg by mouth every Monday, Wednesday, and Friday. 5 mg PRIOR TO dialysis on Mon/Wed/Fri 04/30/17  Yes [provider]  nitroGLYCERIN (NITROSTAT) 0.4 MG SL tablet Place  1 tablet (0.4 mg total) under the tongue every 5 (five) minutes as needed for chest pain. 08/19/16 03/31/26 Yes Weaver, Scott T, PA-C  ondansetron (ZOFRAN ODT) 4 MG disintegrating tablet Take 1 tablet (4 mg total) by mouth every 8 (eight) hours as needed for nausea or vomiting. 03/29/18  Yes Deno Etienne, DO  OXYGEN Inhale 2-3 L into the lungs See admin instructions. Use every night and during the day as needed for shortness of breath   Yes [provider]  predniSONE (DELTASONE) 5 MG tablet Take 5 mg by mouth daily with breakfast.   Yes [provider]  HYDROcodone-acetaminophen (NORCO) 5-325 MG tablet Take 1 tablet by mouth every 6 (six) hours as needed for moderate pain. Patient not taking: Reported on 04/13/2019 12/08/17   Dagoberto Ligas, PA-C  predniSONE (DELTASONE) 10 MG tablet Take 10 mg by mouth as directed. DS 5 04/07/19   [provider]  Respiratory Therapy Supplies (NEBULIZER) DEVI 1 Device by Does not apply route as needed. 12/07/18   Fenton Foy, NP    Physical Exam:  Constitutional: Elderly female who appears acutely ill Vitals:   04/13/19 1415 04/13/19 1430 04/13/19 1445 04/13/19 1500  BP: 118/79 (!) 99/52 (!) 101/44 93/65  Pulse:      Resp: 13 20 (!) 21 (!) 21  Temp:      TempSrc:      SpO2:       Eyes: PERRL, lids and conjunctivae normal ENMT: Mucous membranes are moist. Posterior pharynx clear of any exudate or lesions.  Neck: normal, supple, no masses, no thyromegaly Respiratory: Mildly tachypneic with decreased overall aeration.  No significant  wheezes or rhonchi appreciated.  Patient currently on 4 L nasal cannula oxygen. Cardiovascular: Regular rate and rhythm, no murmurs / rubs / gallops. No extremity edema. 2+ pedal pulses. No carotid bruits.  Left upper extremity fistula present. Abdomen: no tenderness, no masses palpated. No hepatosplenomegaly. Bowel sounds positive.  Musculoskeletal: no clubbing / cyanosis. No joint deformity upper and lower extremities. Good ROM, no contractures. Normal muscle tone.  Skin: no rashes, lesions, ulcers. No induration Neurologic: CN 2-12 grossly intact. Sensation intact, DTR normal. Strength 5/5 in all 4.  Psychiatric: Normal judgment and insight. Alert and oriented x 3. Normal mood.     Labs on Admission: I have personally reviewed following labs and imaging studies  CBC: Recent Labs  Lab 04/11/19 0955 04/13/19 1230  WBC 5.4 5.2  NEUTROABS  --  3.7  HGB 10.7* 11.1*  HCT 36.0 38.1  MCV 107.1* 110.1*  PLT PLATELET CLUMPS NOTED ON SMEAR, COUNT APPEARS DECREASED 322*   Basic Metabolic Panel: Recent Labs  Lab 04/11/19 0955 04/13/19 1230  NA 138 139  K 3.1* 3.7  CL 104 114*  CO2 16* 12*  GLUCOSE 77 77  BUN 50* 68*  CREATININE 9.09* 11.56*  CALCIUM 8.7* 8.3*   GFR: CrCl cannot be calculated (Unknown ideal weight.). Liver Function Tests: No results for input(s): AST, ALT, ALKPHOS, BILITOT, PROT, ALBUMIN in the last 168 hours. No results for input(s): LIPASE, AMYLASE in the last 168 hours. No results for input(s): AMMONIA in the last 168 hours. Coagulation Profile: No results for input(s): INR, PROTIME in the last 168 hours. Cardiac Enzymes: No results for input(s): CKTOTAL, CKMB, CKMBINDEX, TROPONINI in the last 168 hours. BNP (last 3 results) No results for input(s): PROBNP in the last 8760 hours. HbA1C: No results for input(s): HGBA1C in the last 72 hours. CBG: No results for  input(s): GLUCAP in the last 168 hours. Lipid Profile: No results for input(s): CHOL, HDL,  LDLCALC, TRIG, CHOLHDL, LDLDIRECT in the last 72 hours. Thyroid Function Tests: No results for input(s): TSH, T4TOTAL, FREET4, T3FREE, THYROIDAB in the last 72 hours. Anemia Panel: No results for input(s): VITAMINB12, FOLATE, FERRITIN, TIBC, IRON, RETICCTPCT in the last 72 hours. Urine analysis:    Component Value Date/Time   COLORURINE YELLOW 03/19/2017 1934   APPEARANCEUR HAZY (A) 03/19/2017 1934   LABSPEC 1.006 03/19/2017 1934   PHURINE 5.0 03/19/2017 1934   GLUCOSEU NEGATIVE 03/19/2017 1934   HGBUR MODERATE (A) 03/19/2017 [place] NEGATIVE 03/19/2017 [place] 03/19/2017 1934   PROTEINUR 30 (A) 03/19/2017 1934   UROBILINOGEN 0.2 09/09/2014 0850   NITRITE NEGATIVE 03/19/2017 1934   LEUKOCYTESUR NEGATIVE 03/19/2017 1934   Sepsis Labs: Recent Results (from the past 240 hour(s))  Culture, blood (routine x 2)     Status: None (Preliminary result)   Collection Time: 04/11/19 11:20 AM   Specimen: BLOOD  Result Value Ref Range Status   Specimen Description BLOOD RIGHT ANTECUBITAL  Final   Special Requests   Final    BOTTLES DRAWN AEROBIC AND ANAEROBIC Blood Culture adequate volume   Culture   Final    NO GROWTH 2 DAYS Performed at [facility], [place] [address] [place], [place] [address]    Report Status PENDING  Incomplete  SARS CORONAVIRUS 2 (TAT 6-24 HRS) Nasopharyngeal Nasopharyngeal Swab     Status: Abnormal   Collection Time: 04/11/19 11:21 AM   Specimen: Nasopharyngeal Swab  Result Value Ref Range Status   SARS Coronavirus 2 POSITIVE (A) NEGATIVE Final    Comment: EMAILED TO Aretta Nip AT 2049 04/11/2019 M[place]ORMICK K (NOTE) SARS-CoV-2 target nucleic acids are DETECTED. The SARS-CoV-2 RNA is generally detectable in upper and lower respiratory specimens during the acute phase of infection. Positive results are indicative of active infection with SARS-CoV-2. Clinical  correlation with patient history and other diagnostic information  is necessary to determine patient infection status. Positive results do  not rule out bacterial infection or co-infection with other viruses. The expected result is Negative. Fact Sheet for Patients: SugarRoll.be Fact Sheet for Healthcare Providers: https://www.woods-mathews.com/ This test is not yet approved or cleared by the [place] FDA and  has been authorized for detection and/or diagnosis of SARS-CoV-2 by FDA under an Emergency Use Authorization (EUA). This EUA will remain  in effect (meaning this test can be used) for the duration of the COVID-19 decl aration under Section 564(b)(1) of the Act, 21 U.S.C. section 360bbb-3(b)(1), unless the authorization is terminated or revoked sooner. Performed at [facility], [place] [name] [place], [place] [address]   MRSA PCR Screening     Status: None   Collection Time: 04/11/19 11:32 AM   Specimen: Nasal Mucosa; Nasopharyngeal  Result Value Ref Range Status   MRSA by PCR NEGATIVE NEGATIVE Final    Comment:        The GeneXpert MRSA Assay (FDA approved for NASAL specimens only), is one component of a comprehensive MRSA colonization surveillance program. It is not intended to diagnose MRSA infection nor to guide or monitor treatment for MRSA infections. Performed at [facility], [place] [address], [place], [place] [address]   Culture, blood (routine x 2)     Status: None (Preliminary result)   Collection Time: 04/11/19  1:40 PM   Specimen: BLOOD RIGHT ARM  Result Value Ref  Range Status   Specimen Description BLOOD RIGHT ARM  Final   Special Requests   Final    BOTTLES DRAWN AEROBIC AND ANAEROBIC Blood Culture results may not be optimal due to an inadequate volume of blood received in culture bottles   Culture   Final    NO GROWTH 2 DAYS Performed at [facility], [place] [name] [place], [place] [address]    Report Status PENDING  Incomplete     Radiological  Exams on Admission: Dg Chest Port 1 View  Result Date: 04/13/2019 CLINICAL DATA:  73 year old female with increasing shortness of breath. EXAM: PORTABLE CHEST 1 VIEW COMPARISON:  Chest radiograph dated 04/11/2019 and CT dated 10/13/2018. FINDINGS: Background of emphysema. No focal consolidation, pleural effusion, or pneumothorax. Right lung base atelectasis/scarring. There is mild cardiomegaly. No acute osseous pathology. Left axillary vascular stent and thyroidectomy surgical clips. IMPRESSION: 1. No acute cardiopulmonary process. 2. Emphysema. Electronically Signed   By: Anner Crete M.D.   On: 04/13/2019 11:53    EKG: Independently reviewed.  Sinus rhythm at 57 bpm  Assessment/Plan Acute on chronic respiratory failure with hypoxia, COVID-19 virus infection: Acute.  Patient diagnosed with Covid pneumonia on 04/11/2019.  She presents with complaints of worsening shortness of breath although chest x-ray today showed no acute abnormalities.  O2 saturations currently maintained on 4 L nasal cannula oxygen.  Inflammatory markers were not initially obtained. -Admit to a telemetry bed at [place] order set utilized -Continuous pulse oximetry with nasal cannula oxygen to maintain O2 saturation greater than 90% -Check inflammatory markers stat -Albuterol inhaler -Antitussives as needed -Decadron 6 mg daily -Continue to monitor inflammatory markers daily  ESRD on HD: Patient normally dialyzes Monday, Wednesday, and Fridays.  Labs revealed potassium 3.7, BUN 68, creatinine 11.56.  She last dialyzed 5 days ago. -Continue midodrine with hemodialysis -Continue home medication regimen for secondary hyperparathyroidism -Nephrology consulted and we will dialyze the patient  Macrocytic anemia: Stable.  Hemoglobin 11.1 on admission. -Continue to monitor  Crohn's disease on chronic immunosuppressive therapy: Patient is on steroids and Humira.  Last Humira injection on 11/5.  She -Continue  steroids as seen above  History of pulmonary embolus on chronic anticoagulation: Patient with a previous history of PE currently on Eliquis -Continue Eliquis  COPD/emphysema: Without acute exacerbation. -Continue Trilogy Ellipta  Thrombocytopenia: Chronic.  Platelet count 117.  Denies any reports of bleeding -Continue to monitor  Hypothyroidism -Continue levothyroxine  DVT prophylaxis: Eliquis Code Status: Full  Family Communication: No family present at bedside Disposition Plan: Likely discharge home in 3 to 5 days Consults called: nephroplogy  Admission status: Inpatient  Norval Morton MD [facility] Pager [phone]   If 7PM-7AM, please contact night-coverage [email or web address] Password [facility]  04/13/2019, 4:26 PM

## 2019-04-13 NOTE — ED Triage Notes (Signed)
C/o increased SOB over the last week.  Diagnosed with pneumonia on Monday.  Reports SOB is worse and she was called with positive COVID results.   She turned home O2 up to 4liters and states she feels like she needs more than that.

## 2019-04-14 ENCOUNTER — Inpatient Hospital Stay (HOSPITAL_COMMUNITY): Payer: Medicare Other

## 2019-04-14 DIAGNOSIS — U071 COVID-19: Principal | ICD-10-CM

## 2019-04-14 LAB — COMPREHENSIVE METABOLIC PANEL
ALT: 12 U/L (ref 0–44)
AST: 16 U/L (ref 15–41)
Albumin: 2.6 g/dL — ABNORMAL LOW (ref 3.5–5.0)
Alkaline Phosphatase: 41 U/L (ref 38–126)
Anion gap: 16 — ABNORMAL HIGH (ref 5–15)
BUN: 70 mg/dL — ABNORMAL HIGH (ref 8–23)
CO2: 11 mmol/L — ABNORMAL LOW (ref 22–32)
Calcium: 8.1 mg/dL — ABNORMAL LOW (ref 8.9–10.3)
Chloride: 110 mmol/L (ref 98–111)
Creatinine, Ser: 12.3 mg/dL — ABNORMAL HIGH (ref 0.44–1.00)
GFR calc Af Amer: 3 mL/min — ABNORMAL LOW (ref >60)
GFR calc non Af Amer: 3 mL/min — ABNORMAL LOW (ref >60)
Glucose, Bld: 94 mg/dL (ref 70–99)
Potassium: 4.1 mmol/L (ref 3.5–5.1)
Sodium: 137 mmol/L (ref 135–145)
Total Bilirubin: 0.5 mg/dL (ref 0.3–1.2)
Total Protein: 5.9 g/dL — ABNORMAL LOW (ref 6.5–8.1)

## 2019-04-14 LAB — D-DIMER, QUANTITATIVE: D-Dimer, Quant: 2.09 ug/mL-FEU — ABNORMAL HIGH (ref 0.00–0.50)

## 2019-04-14 LAB — MAGNESIUM: Magnesium: 1.7 mg/dL (ref 1.7–2.4)

## 2019-04-14 LAB — CBC WITH DIFFERENTIAL/PLATELET
Abs Immature Granulocytes: 0.05 10*3/uL (ref 0.00–0.07)
Basophils Absolute: 0 10*3/uL (ref 0.0–0.1)
Basophils Relative: 0 %
Eosinophils Absolute: 0 10*3/uL (ref 0.0–0.5)
Eosinophils Relative: 0 %
HCT: 35.8 % — ABNORMAL LOW (ref 36.0–46.0)
Hemoglobin: 10.5 g/dL — ABNORMAL LOW (ref 12.0–15.0)
Immature Granulocytes: 1 %
Lymphocytes Relative: 10 %
Lymphs Abs: 0.4 10*3/uL — ABNORMAL LOW (ref 0.7–4.0)
MCH: 31.5 pg (ref 26.0–34.0)
MCHC: 29.3 g/dL — ABNORMAL LOW (ref 30.0–36.0)
MCV: 107.5 fL — ABNORMAL HIGH (ref 80.0–100.0)
Monocytes Absolute: 0.1 10*3/uL (ref 0.1–1.0)
Monocytes Relative: 3 %
Neutro Abs: 3.1 10*3/uL (ref 1.7–7.7)
Neutrophils Relative %: 86 %
Platelets: 112 10*3/uL — ABNORMAL LOW (ref 150–400)
RBC: 3.33 MIL/uL — ABNORMAL LOW (ref 3.87–5.11)
RDW: 16.5 % — ABNORMAL HIGH (ref 11.5–15.5)
WBC: 3.6 10*3/uL — ABNORMAL LOW (ref 4.0–10.5)
nRBC: 0.5 % — ABNORMAL HIGH (ref 0.0–0.2)

## 2019-04-14 LAB — PHOSPHORUS: Phosphorus: 9.4 mg/dL — ABNORMAL HIGH (ref 2.5–4.6)

## 2019-04-14 LAB — FERRITIN: Ferritin: 836 ng/mL — ABNORMAL HIGH (ref 11–307)

## 2019-04-14 LAB — C-REACTIVE PROTEIN: CRP: 9.6 mg/dL — ABNORMAL HIGH (ref <1.0)

## 2019-04-14 MED ORDER — PREDNISONE 5 MG PO TABS
5.0000 mg | ORAL_TABLET | Freq: Every day | ORAL | Status: DC
  Administered 2019-04-16: 5 mg via ORAL
  Filled 2019-04-14 (×3): qty 1

## 2019-04-14 MED ORDER — SODIUM CHLORIDE 0.9 % IV SOLN
200.0000 mg | Freq: Once | INTRAVENOUS | Status: AC
  Administered 2019-04-14: 200 mg via INTRAVENOUS
  Filled 2019-04-14: qty 40

## 2019-04-14 MED ORDER — SODIUM CHLORIDE 0.9 % IV SOLN: 100.0000 mg | INTRAVENOUS | Status: DC

## 2019-04-14 MED ORDER — HYDROMORPHONE HCL 1 MG/ML IJ SOLN
0.2500 mg | Freq: Once | INTRAMUSCULAR | Status: AC
  Administered 2019-04-14: 0.25 mg via INTRAVENOUS
  Filled 2019-04-14: qty 1

## 2019-04-14 MED ORDER — ACETAMINOPHEN 325 MG PO TABS
650.0000 mg | ORAL_TABLET | Freq: Four times a day (QID) | ORAL | Status: DC | PRN
  Administered 2019-04-14: 650 mg via ORAL
  Filled 2019-04-14: qty 2

## 2019-04-14 NOTE — ED Notes (Signed)
+  Cov/Tele   Breakfast ordered

## 2019-04-14 NOTE — ED Notes (Signed)
Patient transported to CT 

## 2019-04-14 NOTE — Progress Notes (Signed)
Subjective:  In ER all night-  Has not been able to get HD because does not have a room and cannot do HD in the ER-  In looking through notes pt has many complaints- a lot of customer service issues - still sat OK on 4 liters - interestingly CXR shows "no acute cardiopulmonary process   Objective Vital signs in last 24 hours: Vitals:   04/14/19 0345 04/14/19 0500 04/14/19 0600 04/14/19 0800  BP:  130/61 (!) 121/56 120/60  Pulse: (!) 31 (!) 40 81 99  Resp: 16 16 15  (!) 21  Temp:      TempSrc:      SpO2: 100% 100% 100% 100%   Weight change:  No intake or output data in the 24 hours ending 04/14/19 1552  Dialysis Orders: Center: [facility]   on mwf . But now due to [place] will be TTS at [facility] EDW 51.0 HD Bath 3k/2.25Ca  Time 4hr Heparin 4200.   LUA AVGG     Hectorol 3.63mg IV/HD  Mircera 75 mcg q 2wks ( last given 04/04/19)   Venofer 100 mg loading x 10 (given 2 @ op [facility] center)    Assessment/Plan 1. SOB with COVID positive ( dyspnea despite home O2 for COPD)-  CXR not that remarkable.  Started on steroids/inhalers/antitussives 2. ESRD - Has not had OP HD since Friday should look to do HD when can.  Orders in-  Will depend on when  Ms. FScahillis in room.  FYI-  [facility] [address] at noon today if the prospects of her getting a room fall through and it is ultimately decided that she does not require inpatient care  3. Hypertension/volume  - bp stable , no wt yet / no vol reported on CXR  , plans for hd as soon as can do ,prob lower edw with wt loss with covid  Fu bps /wts / uses midodrine  4. Anemia  Of ESRD - hgb  10.5  No current esa needs  5. Metabolic bone disease -  Po vit d on hd / binders with meals - rocaltrol and phoslo 6. COPD - on inhaler/ meds     KLouis Meckel   Labs: Basic Metabolic Panel: Recent Labs  Lab 04/11/19 0955 04/13/19 1230 04/14/19 0430  NA 138 139 137  K 3.1* 3.7 4.1  CL 104 114* 110  CO2 16* 12* 11*  GLUCOSE 77  77 94  BUN 50* 68* 70*  CREATININE 9.09* 11.56* 12.30*  CALCIUM 8.7* 8.3* 8.1*  PHOS  --   --  9.4*   Liver Function Tests: Recent Labs  Lab 04/14/19 0430  AST 16  ALT 12  ALKPHOS 41  BILITOT 0.5  PROT 5.9*  ALBUMIN 2.6*   No results for input(s): LIPASE, AMYLASE in the last 168 hours. No results for input(s): AMMONIA in the last 168 hours. CBC: Recent Labs  Lab 04/11/19 0955 04/13/19 1230 04/14/19 0430  WBC 5.4 5.2 3.6*  NEUTROABS  --  3.7 3.1  HGB 10.7* 11.1* 10.5*  HCT 36.0 38.1 35.8*  MCV 107.1* 110.1* 107.5*  PLT PLATELET CLUMPS NOTED ON SMEAR, COUNT APPEARS DECREASED 117* 112*   Cardiac Enzymes: No results for input(s): CKTOTAL, CKMB, CKMBINDEX, TROPONINI in the last 168 hours. CBG: No results for input(s): GLUCAP in the last 168 hours.  Iron Studies:  Recent Labs    04/14/19 0430  FERRITIN 836*   Studies/Results: Dg Chest  Port 1 View  Result Date: 04/13/2019 CLINICAL DATA:  73 year old female with increasing shortness of breath. EXAM: PORTABLE CHEST 1 VIEW COMPARISON:  Chest radiograph dated 04/11/2019 and CT dated 10/13/2018. FINDINGS: Background of emphysema. No focal consolidation, pleural effusion, or pneumothorax. Right lung base atelectasis/scarring. There is mild cardiomegaly. No acute osseous pathology. Left axillary vascular stent and thyroidectomy surgical clips. IMPRESSION: 1. No acute cardiopulmonary process. 2. Emphysema. Electronically Signed   By: Anner Crete M.D.   On: 04/13/2019 11:53   Medications: Infusions: . remdesivir 200 mg in NS 250 mL     Followed by  . [START ON 04/15/2019] remdesivir 100 mg in NS 250 mL      Scheduled Medications: . albuterol  2 puff Inhalation Q6H  . apixaban  2.5 mg Oral BID  . [START ON 04/15/2019] calcitRIOL  0.25 mcg Oral Q M,W,F-HD  . calcium acetate  667 mg Oral TID WC  . Chlorhexidine Gluconate Cloth  6 each Topical Q0600  . dexamethasone  6 mg Oral Q24H  . fluticasone  1-2 spray Each Nare  Daily  . fluticasone furoate-vilanterol  1 puff Inhalation Daily  . levothyroxine  125 mcg Oral Q0600  . [START ON 04/15/2019] lidocaine-prilocaine  1 application Topical Q M,W,F-HD  . midodrine  5 mg Oral Q M,W,F  . sodium chloride flush  3 mL Intravenous Q12H  . umeclidinium bromide  1 puff Inhalation Daily  . vitamin C  500 mg Oral Daily  . zinc sulfate  220 mg Oral Daily    have reviewed scheduled and prn medications.  Physical Exam: Patient is not personally examined.  Reviewed notes with physical exam from other providers.  This is in effort to preserve PPE and to limit exposure to all providers   04/14/2019,8:28 AM  LOS: 1 day

## 2019-04-14 NOTE — Progress Notes (Signed)
PROGRESS NOTE    Natasha [name]  PFX:[id number] DOB: 10-25-1945 DOA: 04/13/2019 PCP: [name], MD   Brief Narrative:  Natasha [name] is Natasha [name] 73 y.o. female with medical history significant of ESRD on HD(M/W/F), Crohn's disease, history of PE on chronic anticoagulation, hypothyroidism, COPD, oxygen dependent on 3 to 4 L, and anemia of chronic disease.  She presents with complaints of progressively worsening shortness of breath over the last week. Symptoms started early last week with complaints of headache, myalgias, nausea, vomiting, diarrhea, lower back pain, and productive cough.  She reported initially coughing up clear sputum, but it had changed to yellowish-green color.  Her last hemodialysis session was 5 days ago.  Denies any significant fever.  She was seen in the emergency department 2 days or symptoms and diagnosed with pneumonia.  While in the ED she was given vancomycin and cefepime.  After talks with nephrology they had set her up to receive [facility] during dialysis.  However, after getting home patient reports that she felt worse and later told that she was Covid positive.  She had reported having diarrhea up to 7 times Natasha [name] day, but it appeared to resolve after being seen in the emergency department.  She was titrating her oxygen up to the highest level without relief of her symptoms.  Patient notes that her grandson had been coming over and his mother had recently tested positive for Covid.    ED Course: Upon admission into the emergency department patient was noted to be afebrile, pulse 105, respirations 13-25, blood pressures maintained, and O2 saturations 100% on 4 L of nasal cannula oxygen.  Labs revealed WBC 5.2, hemoglobin 11.1 , platelets 117, BUN 68, creatinine 11.56, and glucose 77.  Chest x-ray showed edema without no acute abnormality, but just 2 days ago there was concern for right lower lobe pneumonia.  Nephrology was consulted and we will dialyze the patient when  she gets to Natasha [name] room.  TRH called to admit.   Assessment & Plan:   Principal Problem:   COVID-19 virus infection Active Problems:   Anemia of chronic disease   ESRD on dialysis ([place])   Hypothyroidism   Emphysema/COPD (HCC)   Hx of Pulmonary embolism (HCC)   Acute on chronic respiratory failure with hypoxia (HCC)   Chronic anticoagulation   Acute on chronic respiratory failure with hypoxia, COVID-19 virus infection: Acute.  Patient diagnosed with Covid pneumonia on 04/11/2019.   CXR on 11/16 with ill defined opacity medial R base, concerning for focus of pneumonia CXR 11/18 without acute abnormalities CT scan 11/19 also without acute intrathoracic process - at bedside today, she's on her home O2, but notes her breathing feels worse than baseline - She was started on dexamethasone 6 mg daily and received dose of remdesivir on 11/19 as well - hold additional doses after today and follow given no findings on CT - daily inflammatory labs - I/O, daily weights - she has HA and back pain, ? Myalgias and HA related to covid - follow   COVID-19 Labs  Recent Labs    04/13/19 1650 04/14/19 0430  DDIMER 1.82* 2.09*  FERRITIN 810* 836*  LDH 198*  --   CRP 8.8* 9.6*    Lab Results  Component Value Date   SARSCOV2NAA POSITIVE (Natasha [name]) 04/11/2019   SARSCOV2NAA NOT DETECTED 03/25/2019   Natasha [name] NEGATIVE 10/13/2018   ESRD on HD: Patient normally dialyzes Monday, Wednesday, and Fridays.  Per my discussion she last dialyzed Friday prior to admission. 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  Hemoglobin 11.1 on admission. -Continue to monitor  Crohn's disease on chronic immunosuppressive therapy: Patient is on steroids and Humira.  Last Humira injection on 11/5.   - resume prednisone tomorrow  History of pulmonary embolus on  chronic anticoagulation: Patient with [name] previous history of PE currently on Eliquis -Continue Eliquis  COPD/emphysema: Without acute exacerbation. -Continue Trilogy Ellipta  Thrombocytopenia: Chronic.  Platelet count 117.  Denies any reports of bleeding -Continue to monitor  Hypothyroidism -Continue levothyroxine  DVT prophylaxis: eliquis  Code Status: full  Family Communication: none at bedside Disposition Plan: pending dialysis, improvement in resp symptoms, possibly 11/20   Consultants:   nephrology  Procedures:   none  Antimicrobials:   none    Subjective: Feels SOB, [name] little worse than baseline C/o HA and back pain as well Last fever she knows of was monday  Objective: Vitals:   04/14/19 1130 04/14/19 1200 04/14/19 1300 04/14/19 1400  BP: (!) 115/49 115/68 118/61 139/87  Pulse:    81  Resp: 16 16 (!) 22 (!) 22  Temp:      TempSrc:      SpO2:    100%    Intake/Output Summary (Last 24 hours) at 04/14/2019 1540 Last data filed at 04/14/2019 1012 Gross per 24 hour  Intake 249.47 ml  Output -  Net 249.47 ml   There were no vitals filed for this visit.  Examination:  General exam: Appears calm and comfortable  Respiratory system: unlabored Cardiovascular system: RRR Gastrointestinal system: Abdomen is nondistended, soft and nontender. Central nervous system: Alert and oriented. No focal neurological deficits. Extremities: no LEE Skin: No rashes, lesions or ulcers Psychiatry: Judgement and insight appear normal. Mood & affect appropriate.     Data Reviewed: I have personally reviewed following labs and imaging studies  CBC: Recent Labs  Lab 04/11/19 0955 04/13/19 1230 04/14/19 0430  WBC 5.4 5.2 3.6*  NEUTROABS  --  3.7 3.1  HGB 10.7* 11.1* 10.5*  HCT 36.0 38.1 35.8*  MCV 107.1* 110.1* 107.5*  PLT PLATELET CLUMPS NOTED ON SMEAR, COUNT APPEARS DECREASED 117* 536*   Basic Metabolic Panel: Recent Labs  Lab 04/11/19 0955  04/13/19 1230 04/14/19 0430  NA 138 139 137  K 3.1* 3.7 4.1  CL 104 114* 110  CO2 16* 12* 11*  GLUCOSE 77 77 94  BUN 50* 68* 70*  CREATININE 9.09* 11.56* 12.30*  CALCIUM 8.7* 8.3* 8.1*  MG  --   --  1.7  PHOS  --   --  9.4*   GFR: CrCl cannot be calculated (Unknown ideal weight.). Liver Function Tests: Recent Labs  Lab 04/14/19 0430  AST 16  ALT 12  ALKPHOS 41  BILITOT 0.5  PROT 5.9*  ALBUMIN 2.6*   No results for input(s): LIPASE, AMYLASE in the last 168 hours. No results for input(s): AMMONIA in the last 168 hours. Coagulation Profile: No results for input(s): INR, PROTIME in the last 168 hours. Cardiac Enzymes: No results for input(s): CKTOTAL, CKMB, CKMBINDEX, TROPONINI in the last 168 hours. BNP (last 3 results) No results for input(s): PROBNP in the last 8760 hours. HbA1C: No results for input(s): HGBA1C in the last 72 hours. CBG: No results for input(s): GLUCAP in the last 168 hours. Lipid Profile: No results for input(s): CHOL, HDL, LDLCALC, TRIG, CHOLHDL, LDLDIRECT in the last 72 hours. Thyroid  Function Tests: Recent Labs    04/13/19 1800  TSH 3.318   Anemia Panel: Recent Labs    04/13/19 1650 04/14/19 0430  FERRITIN 810* 836*   Sepsis Labs: Recent Labs  Lab 04/11/19 1124 04/13/19 1217 04/13/19 1650  PROCALCITON  --   --  2.16  LATICACIDVEN 1.0 0.6  --     Recent Results (from the past 240 hour(s))  Culture, blood (routine x 2)     Status: None (Preliminary result)   Collection Time: 04/11/19 11:20 AM   Specimen: BLOOD  Result Value Ref Range Status   Specimen Description BLOOD RIGHT ANTECUBITAL  Final   Special Requests   Final    BOTTLES DRAWN AEROBIC AND ANAEROBIC Blood Culture adequate volume   Culture   Final    NO GROWTH 3 DAYS Performed at [facility], [place] [address], [place], [place] [address]    Report Status PENDING  Incomplete  SARS CORONAVIRUS 2 (TAT 6-24 HRS) Nasopharyngeal Nasopharyngeal Swab     Status:  Abnormal   Collection Time: 04/11/19 11:21 AM   Specimen: Nasopharyngeal Swab  Result Value Ref Range Status   SARS Coronavirus 2 POSITIVE (Harrell Niehoff) NEGATIVE Final    Comment: EMAILED TO Aretta Nip AT 2049 04/11/2019 MCCORMICK K (NOTE) SARS-CoV-2 target nucleic acids are DETECTED. The SARS-CoV-2 RNA is generally detectable in upper and lower respiratory specimens during the acute phase of infection. Positive results are indicative of active infection with SARS-CoV-2. Clinical  correlation with patient history and other diagnostic information is necessary to determine patient infection status. Positive results do  not rule out bacterial infection or co-infection with other viruses. The expected result is Negative. Fact Sheet for Patients: SugarRoll.be Fact Sheet for Healthcare Providers: https://www.woods-mathews.com/ This test is not yet approved or cleared by the [place] FDA and  has been authorized for detection and/or diagnosis of SARS-CoV-2 by FDA under an Emergency Use Authorization (EUA). This EUA will remain  in effect (meaning this test can be used) for the duration of the COVID-19 decl aration under Section 564(b)(1) of the Act, 21 U.S.C. section 360bbb-3(b)(1), unless the authorization is terminated or revoked sooner. Performed at [facility], [place] [address] [place], [place] [address]   MRSA PCR Screening     Status: None   Collection Time: 04/11/19 11:32 AM   Specimen: Nasal Mucosa; Nasopharyngeal  Result Value Ref Range Status   MRSA by PCR NEGATIVE NEGATIVE Final    Comment:        The GeneXpert MRSA Assay (FDA approved for NASAL specimens only), is one component of Ulices Maack comprehensive MRSA colonization surveillance program. It is not intended to diagnose MRSA infection nor to guide or monitor treatment for MRSA infections. Performed at [facility], [place] [address] [place], [place] [address]   Culture,  blood (routine x 2)     Status: None (Preliminary result)   Collection Time: 04/11/19  1:40 PM   Specimen: BLOOD RIGHT ARM  Result Value Ref Range Status   Specimen Description BLOOD RIGHT ARM  Final   Special Requests   Final    BOTTLES DRAWN AEROBIC AND ANAEROBIC Blood Culture results may not be optimal due to an inadequate volume of blood received in culture bottles   Culture   Final    NO GROWTH 3 DAYS Performed at [facility], [place] [address] [place],  [address]    Report Status PENDING  Incomplete  Radiology Studies: Ct Chest Wo Contrast  Result Date: 04/14/2019 CLINICAL DATA:  Shortness of breath.  COVID-19 positive. EXAM: CT CHEST WITHOUT CONTRAST TECHNIQUE: Multidetector CT imaging of the chest was performed following the standard protocol without IV contrast. COMPARISON:  Chest x-ray from yesterday. CTA chest dated Oct 13, 2018. FINDINGS: Cardiovascular: Normal heart size. No pericardial effusion. No thoracic aortic aneurysm. Coronary, aortic arch, and branch vessel atherosclerotic vascular disease. Unchanged left axillary vein stent. Mediastinum/Nodes: No enlarged mediastinal or axillary lymph nodes. Prior thyroidectomy. The trachea and esophagus demonstrate no significant findings. Lungs/Pleura: Unchanged moderate centrilobular emphysema. Mildly increased subsegmental atelectasis in the right lower lobe. No focal consolidation, pleural effusion, or pneumothorax. Unchanged 7 x 4 mm ground-glass nodule in the left upper lobe (series 6, image 70), stable since 2017, benign. No new pulmonary nodule. Upper Abdomen: No acute abnormality. Musculoskeletal: No chest wall mass or suspicious bone lesions identified. IMPRESSION: 1.  No acute intrathoracic process. 2.  Emphysema (ICD10-J43.9). 3.  Aortic atherosclerosis (ICD10-I70.0). Electronically Signed   By: Titus Dubin M.D.   On: 04/14/2019 11:10   Dg Chest Port 1 View  Result Date: 04/13/2019 CLINICAL DATA:   73 year old female with increasing shortness of breath. EXAM: PORTABLE CHEST 1 VIEW COMPARISON:  Chest radiograph dated 04/11/2019 and CT dated 10/13/2018. FINDINGS: Background of emphysema. No focal consolidation, pleural effusion, or pneumothorax. Right lung base atelectasis/scarring. There is mild cardiomegaly. No acute osseous pathology. Left axillary vascular stent and thyroidectomy surgical clips. IMPRESSION: 1. No acute cardiopulmonary process. 2. Emphysema. Electronically Signed   By: Anner Crete M.D.   On: 04/13/2019 11:53        Scheduled Meds: . albuterol  2 puff Inhalation Q6H  . apixaban  2.5 mg Oral BID  . [START ON 04/15/2019] calcitRIOL  0.25 mcg Oral Q M,W,F-HD  . calcium acetate  667 mg Oral TID WC  . Chlorhexidine Gluconate Cloth  6 each Topical Q0600  . dexamethasone  6 mg Oral Q24H  . fluticasone  1-2 spray Each Nare Daily  . fluticasone furoate-vilanterol  1 puff Inhalation Daily  . levothyroxine  125 mcg Oral Q0600  . [START ON 04/15/2019] lidocaine-prilocaine  1 application Topical Q M,W,F-HD  . midodrine  5 mg Oral Q M,W,F  . sodium chloride flush  3 mL Intravenous Q12H  . umeclidinium bromide  1 puff Inhalation Daily  . vitamin C  500 mg Oral Daily  . zinc sulfate  220 mg Oral Daily   Continuous Infusions: . [START ON 04/15/2019] remdesivir 100 mg in NS 250 mL       LOS: 1 day    Time spent: over 30 min    Fayrene Helper, MD [facility] Pager AMION  If 7PM-7AM, please contact night-coverage [email or web address] Password [email or web address] 04/14/2019, 3:40 PM

## 2019-04-14 NOTE — ED Notes (Signed)
Renal dinner tray ordered

## 2019-04-14 NOTE — ED Notes (Signed)
Paged [facility]ad to rn Will--Siobahn Worsley

## 2019-04-15 ENCOUNTER — Inpatient Hospital Stay (HOSPITAL_COMMUNITY): Payer: Medicare Other

## 2019-04-15 LAB — BLOOD GAS, ARTERIAL
Acid-base deficit: 3 mmol/L — ABNORMAL HIGH (ref 0.0–2.0)
Bicarbonate: 21.4 mmol/L (ref 20.0–28.0)
Drawn by: 39899
FIO2: 44
O2 Saturation: 97.9 %
Patient temperature: 38.4
pCO2 arterial: 40.6 mmHg (ref 32.0–48.0)
pH, Arterial: 7.35 (ref 7.350–7.450)
pO2, Arterial: 121 mmHg — ABNORMAL HIGH (ref 83.0–108.0)

## 2019-04-15 LAB — COMPREHENSIVE METABOLIC PANEL
ALT: 12 U/L (ref 0–44)
AST: 17 U/L (ref 15–41)
Albumin: 2.8 g/dL — ABNORMAL LOW (ref 3.5–5.0)
Alkaline Phosphatase: 43 U/L (ref 38–126)
Anion gap: 18 — ABNORMAL HIGH (ref 5–15)
BUN: 86 mg/dL — ABNORMAL HIGH (ref 8–23)
CO2: 11 mmol/L — ABNORMAL LOW (ref 22–32)
Calcium: 8.2 mg/dL — ABNORMAL LOW (ref 8.9–10.3)
Chloride: 109 mmol/L (ref 98–111)
Creatinine, Ser: 13.82 mg/dL — ABNORMAL HIGH (ref 0.44–1.00)
GFR calc Af Amer: 3 mL/min — ABNORMAL LOW (ref >60)
GFR calc non Af Amer: 2 mL/min — ABNORMAL LOW (ref >60)
Glucose, Bld: 76 mg/dL (ref 70–99)
Potassium: 3.4 mmol/L — ABNORMAL LOW (ref 3.5–5.1)
Sodium: 138 mmol/L (ref 135–145)
Total Bilirubin: 0.6 mg/dL (ref 0.3–1.2)
Total Protein: 6 g/dL — ABNORMAL LOW (ref 6.5–8.1)

## 2019-04-15 LAB — CBC WITH DIFFERENTIAL/PLATELET
Abs Immature Granulocytes: 0.09 10*3/uL — ABNORMAL HIGH (ref 0.00–0.07)
Basophils Absolute: 0 10*3/uL (ref 0.0–0.1)
Basophils Relative: 0 %
Eosinophils Absolute: 0.1 10*3/uL (ref 0.0–0.5)
Eosinophils Relative: 1 %
HCT: 36.8 % (ref 36.0–46.0)
Hemoglobin: 11 g/dL — ABNORMAL LOW (ref 12.0–15.0)
Immature Granulocytes: 2 %
Lymphocytes Relative: 20 %
Lymphs Abs: 1 10*3/uL (ref 0.7–4.0)
MCH: 31.3 pg (ref 26.0–34.0)
MCHC: 29.9 g/dL — ABNORMAL LOW (ref 30.0–36.0)
MCV: 104.8 fL — ABNORMAL HIGH (ref 80.0–100.0)
Monocytes Absolute: 0.5 10*3/uL (ref 0.1–1.0)
Monocytes Relative: 11 %
Neutro Abs: 3.2 10*3/uL (ref 1.7–7.7)
Neutrophils Relative %: 66 %
Platelets: 141 10*3/uL — ABNORMAL LOW (ref 150–400)
RBC: 3.51 MIL/uL — ABNORMAL LOW (ref 3.87–5.11)
RDW: 16.3 % — ABNORMAL HIGH (ref 11.5–15.5)
WBC: 4.9 10*3/uL (ref 4.0–10.5)
nRBC: 0.4 % — ABNORMAL HIGH (ref 0.0–0.2)

## 2019-04-15 LAB — FOLATE: Folate: 7 ng/mL (ref >5.9)

## 2019-04-15 LAB — TSH: TSH: 4.294 u[IU]/mL (ref 0.350–4.500)

## 2019-04-15 LAB — VITAMIN B12: Vitamin B-12: 2548 pg/mL — ABNORMAL HIGH (ref 180–914)

## 2019-04-15 LAB — D-DIMER, QUANTITATIVE: D-Dimer, Quant: 2.18 ug/mL-FEU — ABNORMAL HIGH (ref 0.00–0.50)

## 2019-04-15 LAB — AMMONIA: Ammonia: 39 umol/L — ABNORMAL HIGH (ref 9–35)

## 2019-04-15 LAB — C-REACTIVE PROTEIN: CRP: 7.8 mg/dL — ABNORMAL HIGH (ref <1.0)

## 2019-04-15 LAB — MAGNESIUM: Magnesium: 1.7 mg/dL (ref 1.7–2.4)

## 2019-04-15 LAB — PHOSPHORUS: Phosphorus: 10.1 mg/dL — ABNORMAL HIGH (ref 2.5–4.6)

## 2019-04-15 LAB — FERRITIN: Ferritin: 967 ng/mL — ABNORMAL HIGH (ref 11–307)

## 2019-04-15 MED ORDER — LIDOCAINE HCL (PF) 1 % IJ SOLN: 5.0000 mL | INTRAMUSCULAR | Status: DC | PRN

## 2019-04-15 MED ORDER — ONDANSETRON HCL 4 MG/2ML IJ SOLN
4.0000 mg | Freq: Four times a day (QID) | INTRAMUSCULAR | Status: DC | PRN
  Administered 2019-04-15 – 2019-04-16 (×4): 4 mg via INTRAVENOUS
  Filled 2019-04-15 (×4): qty 2

## 2019-04-15 MED ORDER — SODIUM CHLORIDE 0.9 % IV SOLN: 100.0000 mL | INTRAVENOUS | Status: DC | PRN

## 2019-04-15 MED ORDER — HEPARIN SODIUM (PORCINE) 1000 UNIT/ML DIALYSIS: 20.0000 [IU]/kg | INTRAMUSCULAR | Status: DC | PRN

## 2019-04-15 MED ORDER — HEPARIN SODIUM (PORCINE) 1000 UNIT/ML DIALYSIS: 1000.0000 [IU] | INTRAMUSCULAR | Status: DC | PRN

## 2019-04-15 MED ORDER — LIDOCAINE-PRILOCAINE 2.5-2.5 % EX CREA: 1.0000 "application " | TOPICAL_CREAM | CUTANEOUS | Status: DC | PRN

## 2019-04-15 MED ORDER — PENTAFLUOROPROP-TETRAFLUOROETH EX AERO: 1.0000 "application " | INHALATION_SPRAY | CUTANEOUS | Status: DC | PRN

## 2019-04-15 MED ORDER — ALTEPLASE 2 MG IJ SOLR: 2.0000 mg | Freq: Once | INTRAMUSCULAR | Status: DC | PRN

## 2019-04-15 NOTE — Progress Notes (Signed)
Patient's home OP HD clinic is [facility]. Due to positive COVID status, she will need to treat at [facility] isolation shift, which normally runs TTS 12:00pm. However, next week, due to the Thanksgiving holiday, the COVID shift will run Monday, Wednesday, Saturday at 12:00pm. We will plan accordingly based on patient's discharge date.  Patients are asked to arrive at 11:45am and remain in the car until they are called in to the facility for treatment.  Renal Navigator will follow and discuss this with patient/family.  Alphonzo Cruise, [place] Renal Navigator [phone]

## 2019-04-15 NOTE — Plan of Care (Signed)

## 2019-04-15 NOTE — ED Notes (Signed)
Report attempted 

## 2019-04-15 NOTE — Progress Notes (Signed)
PROGRESS NOTE    Natasha [name]  BZJ:[id number] DOB: 1946-02-25 DOA: 04/13/2019 PCP: [name], MD   Brief Narrative:  Natasha [name] is Natasha [name] 73 y.o. female with medical history significant of ESRD on HD(M/W/F), Crohn's disease, history of PE on chronic anticoagulation, hypothyroidism, COPD, oxygen dependent on 3 to 4 L, and anemia of chronic disease.  She presents with complaints of progressively worsening shortness of breath over the last week. Symptoms started early last week with complaints of headache, myalgias, nausea, vomiting, diarrhea, lower back pain, and productive cough.  She reported initially coughing up clear sputum, but it had changed to yellowish-green color.  Her last hemodialysis session was 5 days ago.  Denies any significant fever.  She was seen in the emergency department 2 days or symptoms and diagnosed with pneumonia.  While in the ED she was given vancomycin and cefepime.  After talks with nephrology they had set her up to receive [facility] during dialysis.  However, after getting home patient reports that she felt worse and later told that she was Covid positive.  She had reported having diarrhea up to 7 times Gregroy Dombkowski day, but it appeared to resolve after being seen in the emergency department.  She was titrating her oxygen up to the highest level without relief of her symptoms.  Patient notes that her grandson had been coming over and his mother had recently tested positive for Covid.    ED Course: Upon admission into the emergency department patient was noted to be afebrile, pulse 105, respirations 13-25, blood pressures maintained, and O2 saturations 100% on 4 L of nasal cannula oxygen.  Labs revealed WBC 5.2, hemoglobin 11.1 , platelets 117, BUN 68, creatinine 11.56, and glucose 77.  Chest x-ray showed edema without no acute abnormality, but just 2 days ago there was concern for right lower lobe pneumonia.  Nephrology was consulted and we will dialyze the patient when  she gets to [facility].  [facility] called to admit.   Assessment & Plan:   Principal Problem:   COVID-19 virus infection Active Problems:   Anemia of chronic disease   ESRD on dialysis ([place])   Hypothyroidism   Emphysema/COPD (HCC)   Hx of Pulmonary embolism (HCC)   Acute on chronic respiratory failure with hypoxia (HCC)   Chronic anticoagulation  Acute Metabolic Encephalopathy: after dialysis, pt was confused.  Lethargic.  Nonfocal exam, moving all extremities.  ABG wnl.  Ammonia slightly elevated.  b12 and folate wnl.  TSH wnl.  She has documented hypotension earlier this AM.  ? If encephalopathic episode related to these low blood pressures noted between 8 and 10 AM.  Reevaluated later in day and she seemed back to her baseline.  Will continue to monitor.    Tachycardia: follow EKG.  She has elevated temperatures.  Could be related to volume removal after dialysis vs fever with oral temp of 100.1 recorded at 1145.  Will follow EKG and continue to monitor.    Hypotension: suspect related to dialysis.  She's on midodrine on dialysis days.  Will continue to monitor.  Acute on chronic respiratory failure with hypoxia, COVID-19 virus infection: Acute.  Patient diagnosed with Covid pneumonia on 04/11/2019.   CXR on 11/16 with ill defined opacity medial R base, concerning for focus of pneumonia CXR 11/18 without acute abnormalities CT scan 11/19 also without acute intrathoracic process CXR 11/20 without acute abnormality - continues on home O2 - She was started on dexamethasone 6 mg daily and received dose  of remdesivir on 11/19 as well - hold additional doses after today and follow given no findings on CT - daily inflammatory labs - I/O, daily weights - she has HA and back pain, ? Myalgias and HA related to covid - follow   COVID-19 Labs  Recent Labs    04/13/19 1650 04/14/19 0430 04/15/19 0420  DDIMER 1.82* 2.09* 2.18*  FERRITIN 810* 836* 967*  LDH 198*  --   --   CRP 8.8* 9.6* 7.8*     Lab Results  Component Value Date   SARSCOV2NAA POSITIVE (Meliss [name]) 04/11/2019   SARSCOV2NAA NOT DETECTED 03/25/2019   [place] NEGATIVE 10/13/2018   ESRD on HD: Patient normally dialyzes Monday, Wednesday, and Fridays.  Per my discussion she last dialyzed Friday prior to admission. -Continue midodrine with hemodialysis -Continue home medication regimen for secondary hyperparathyroidism -Nephrology consulted, appreciate recs - plan for dialysis today  Macrocytic anemia: Stable.  Hemoglobin 11.1 on admission. -Continue to monitor  Crohn's disease on chronic immunosuppressive therapy: Patient is on steroids and Humira.  Last Humira injection on 11/5.   - resume prednisone   History of pulmonary embolus on chronic anticoagulation: Patient with Natasha [name] previous history of PE currently on Eliquis -Continue Eliquis  COPD/emphysema: Without acute exacerbation. -Continue Trilogy Ellipta  Thrombocytopenia: Chronic.  Platelet count 117.  Denies any reports of bleeding -Continue to monitor  Hypothyroidism -Continue levothyroxine  DVT prophylaxis: eliquis  Code Status: full  Family Communication: none at bedside Disposition Plan: pending dialysis, improvement in resp symptoms, possibly 11/20   Consultants:   nephrology  Procedures:   none  Antimicrobials:   none    Subjective: Initially lethargic On reevaluation, back to baseline.  No complaints.   Objective: Vitals:   04/15/19 1100 04/15/19 1115 04/15/19 1130 04/15/19 1145  BP: 117/64 (!) 102/50 (!) 112/58 123/73  Pulse: 83 (!) 120 (!) 118 (!) 120  Resp: 16 16 (!) 24 20  Temp:    100.1 F (37.8 C)  TempSrc:    Axillary  SpO2:      Weight:    48.2 kg  Height:        Intake/Output Summary (Last 24 hours) at 04/15/2019 1804 Last data filed at 04/15/2019 1145 Gross per 24 hour  Intake 240 ml  Output 2500 ml  Net -2260 ml   Filed Weights   04/15/19 0147 04/15/19 0815 04/15/19 1145  Weight: 51.3 kg 51 kg  48.2 kg    Examination:  General: No acute distress. Cardiovascular: RRR Lungs: unlabored Abdomen: Soft, nontender, nondistended  Neurological: Alert and oriented 3. Moves all extremities 4. Cranial nerves II through XII grossly intact. Skin: Warm and dry. No rashes or lesions. Extremities: No clubbing or cyanosis. No edema.   Data Reviewed: I have personally reviewed following labs and imaging studies  CBC: Recent Labs  Lab 04/11/19 0955 04/13/19 1230 04/14/19 0430 04/15/19 0420  WBC 5.4 5.2 3.6* 4.9  NEUTROABS  --  3.7 3.1 3.2  HGB 10.7* 11.1* 10.5* 11.0*  HCT 36.0 38.1 35.8* 36.8  MCV 107.1* 110.1* 107.5* 104.8*  PLT PLATELET CLUMPS NOTED ON SMEAR, COUNT APPEARS DECREASED 117* 112* 903*   Basic Metabolic Panel: Recent Labs  Lab 04/11/19 0955 04/13/19 1230 04/14/19 0430 04/15/19 0420  NA 138 139 137 138  K 3.1* 3.7 4.1 3.4*  CL 104 114* 110 109  CO2 16* 12* 11* 11*  GLUCOSE 77 77 94 76  BUN 50* 68* 70* 86*  CREATININE 9.09* 11.56* 12.30* 13.82*  CALCIUM  8.7* 8.3* 8.1* 8.2*  MG  --   --  1.7 1.7  PHOS  --   --  9.4* 10.1*   GFR: Estimated Creatinine Clearance: 2.8 mL/min (Keland Peyton) (by C-G formula based on SCr of 13.82 mg/dL (H)). Liver Function Tests: Recent Labs  Lab 04/14/19 0430 04/15/19 0420  AST 16 17  ALT 12 12  ALKPHOS 41 43  BILITOT 0.5 0.6  PROT 5.9* 6.0*  ALBUMIN 2.6* 2.8*   No results for input(s): LIPASE, AMYLASE in the last 168 hours. Recent Labs  Lab 04/15/19 1504  AMMONIA 39*   Coagulation Profile: No results for input(s): INR, PROTIME in the last 168 hours. Cardiac Enzymes: No results for input(s): CKTOTAL, CKMB, CKMBINDEX, TROPONINI in the last 168 hours. BNP (last 3 results) No results for input(s): PROBNP in the last 8760 hours. HbA1C: No results for input(s): HGBA1C in the last 72 hours. CBG: No results for input(s): GLUCAP in the last 168 hours. Lipid Profile: No results for input(s): CHOL, HDL, LDLCALC, TRIG, CHOLHDL,  LDLDIRECT in the last 72 hours. Thyroid Function Tests: Recent Labs    04/15/19 1504  TSH 4.294   Anemia Panel: Recent Labs    04/14/19 0430 04/15/19 0420 04/15/19 1504  VITAMINB12  --   --  2,548*  FOLATE  --   --  7.0  FERRITIN 836* 967*  --    Sepsis Labs: Recent Labs  Lab 04/11/19 1124 04/13/19 1217 04/13/19 1650  PROCALCITON  --   --  2.16  LATICACIDVEN 1.0 0.6  --     Recent Results (from the past 240 hour(s))  Culture, blood (routine x 2)     Status: None (Preliminary result)   Collection Time: 04/11/19 11:20 AM   Specimen: BLOOD  Result Value Ref Range Status   Specimen Description BLOOD RIGHT ANTECUBITAL  Final   Special Requests   Final    BOTTLES DRAWN AEROBIC AND ANAEROBIC Blood Culture adequate volume   Culture   Final    NO GROWTH 4 DAYS Performed at [facility], [place] [address], [place], [place] [address]    Report Status PENDING  Incomplete  SARS CORONAVIRUS 2 (TAT 6-24 HRS) Nasopharyngeal Nasopharyngeal Swab     Status: Abnormal   Collection Time: 04/11/19 11:21 AM   Specimen: Nasopharyngeal Swab  Result Value Ref Range Status   SARS Coronavirus 2 POSITIVE (Sabrinia Prien) NEGATIVE Final    Comment: EMAILED TO Aretta Nip AT 2049 04/11/2019 MCCORMICK K (NOTE) SARS-CoV-2 target nucleic acids are DETECTED. The SARS-CoV-2 RNA is generally detectable in upper and lower respiratory specimens during the acute phase of infection. Positive results are indicative of active infection with SARS-CoV-2. Clinical  correlation with patient history and other diagnostic information is necessary to determine patient infection status. Positive results do  not rule out bacterial infection or co-infection with other viruses. The expected result is Negative. Fact Sheet for Patients: SugarRoll.be Fact Sheet for Healthcare Providers: https://www.woods-mathews.com/ This test is not yet approved or cleared by the [place] FDA and   has been authorized for detection and/or diagnosis of SARS-CoV-2 by FDA under an Emergency Use Authorization (EUA). This EUA will remain  in effect (meaning this test can be used) for the duration of the COVID-19 decl aration under Section 564(b)(1) of the Act, 21 U.S.C. section 360bbb-3(b)(1), unless the authorization is terminated or revoked sooner. Performed at [facility], [place] [address] [place], [place] [address]   MRSA PCR Screening  Status: None   Collection Time: 04/11/19 11:32 AM   Specimen: Nasal Mucosa; Nasopharyngeal  Result Value Ref Range Status   MRSA by PCR NEGATIVE NEGATIVE Final    Comment:        The GeneXpert MRSA Assay (FDA approved for NASAL specimens only), is one component of [place] [place] comprehensive MRSA colonization surveillance program. It is not intended to diagnose MRSA infection nor to guide or monitor treatment for MRSA infections. Performed at [facility], [place] [address], [place], [place] [address]   Culture, blood (routine x 2)     Status: None (Preliminary result)   Collection Time: 04/11/19  1:40 PM   Specimen: BLOOD RIGHT ARM  Result Value Ref Range Status   Specimen Description BLOOD RIGHT ARM  Final   Special Requests   Final    BOTTLES DRAWN AEROBIC AND ANAEROBIC Blood Culture results may not be optimal due to an inadequate volume of blood received in culture bottles   Culture   Final    NO GROWTH 4 DAYS Performed at [facility], [place] [address] [place], [place] [address]    Report Status PENDING  Incomplete         Radiology Studies: Ct Chest Wo Contrast  Result Date: 04/14/2019 CLINICAL DATA:  Shortness of breath.  COVID-19 positive. EXAM: CT CHEST WITHOUT CONTRAST TECHNIQUE: Multidetector CT imaging of the chest was performed following the standard protocol without IV contrast. COMPARISON:  Chest x-ray from yesterday. CTA chest dated Oct 13, 2018. FINDINGS: Cardiovascular: Normal heart size. No pericardial  effusion. No thoracic aortic aneurysm. Coronary, aortic arch, and branch vessel atherosclerotic vascular disease. Unchanged left axillary vein stent. Mediastinum/Nodes: No enlarged mediastinal or axillary lymph nodes. Prior thyroidectomy. The trachea and esophagus demonstrate no significant findings. Lungs/Pleura: Unchanged moderate centrilobular emphysema. Mildly increased subsegmental atelectasis in the right lower lobe. No focal consolidation, pleural effusion, or pneumothorax. Unchanged 7 x 4 mm ground-glass nodule in the left upper lobe (series 6, image 70), stable since 2017, benign. No new pulmonary nodule. Upper Abdomen: No acute abnormality. Musculoskeletal: No chest wall mass or suspicious bone lesions identified. IMPRESSION: 1.  No acute intrathoracic process. 2.  Emphysema (ICD10-J43.9). 3.  Aortic atherosclerosis (ICD10-I70.0). Electronically Signed   By: Titus Dubin M.D.   On: 04/14/2019 11:10   Dg Chest Port 1 View  Result Date: 04/15/2019 CLINICAL DATA:  Shortness of breath. EXAM: PORTABLE CHEST 1 VIEW COMPARISON:  Chest x-ray dated 04/13/2019 and chest CT dated 04/14/2019 FINDINGS: The heart size and pulmonary vascularity are normal. Chronic diffuse accentuation of the interstitial markings, unchanged. No acute infiltrates or effusions. No significant bone abnormality. Vascular stent in the left axilla. IMPRESSION: No acute abnormalities. Chronic interstitial lung disease. Emphysema demonstrated on the prior CT scan. Electronically Signed   By: Lorriane Shire M.D.   On: 04/15/2019 13:19        Scheduled Meds: . albuterol  2 puff Inhalation Q6H  . apixaban  2.5 mg Oral BID  . calcitRIOL  0.25 mcg Oral Q M,W,F-HD  . calcium acetate  667 mg Oral TID WC  . Chlorhexidine Gluconate Cloth  6 each Topical Q0600  . fluticasone  1-2 spray Each Nare Daily  . fluticasone furoate-vilanterol  1 puff Inhalation Daily  . levothyroxine  125 mcg Oral Q0600  . lidocaine-prilocaine  1  application Topical Q M,W,F-HD  . midodrine  5 mg Oral Q M,W,F  . predniSONE  5 mg Oral Q breakfast  . sodium  chloride flush  3 mL Intravenous Q12H  . umeclidinium bromide  1 puff Inhalation Daily  . vitamin C  500 mg Oral Daily  . zinc sulfate  220 mg Oral Daily   Continuous Infusions: . sodium chloride    . sodium chloride       LOS: 2 days    Time spent: over 30 min    Fayrene Helper, MD [facility] Pager AMION  If 7PM-7AM, please contact night-coverage [email or web address] Password [facility] 04/15/2019, 6:04 PM

## 2019-04-15 NOTE — Progress Notes (Signed)
Subjective:  Finally getting HD this AM after being located to a room on 2W  Objective Vital signs in last 24 hours: Vitals:   04/15/19 0158 04/15/19 0754 04/15/19 0815 04/15/19 0830  BP:  (!) 114/55 (!) 112/58 112/61  Pulse:  (!) 102 (!) 104 (!) 106  Resp:  16 16 16   Temp:  99.1 F (37.3 C) 99.1 F (37.3 C)   TempSrc:  Oral Oral   SpO2:  (!) 87%    Weight:   51 kg   Height: 5' 2"  (1.575 m)      Weight change:   Intake/Output Summary (Last 24 hours) at 04/15/2019 0840 Last data filed at 04/15/2019 0200 Gross per 24 hour  Intake 489.47 ml  Output -  Net 489.47 ml    Dialysis Orders: Center: [facility]   on mwf . But now due to [place] will be TTS at [facility] EDW 51.0 HD Bath 3k/2.25Ca  Time 4hr Heparin 4200.   LUA AVGG     Hectorol 3.56mg IV/HD  Mircera 75 mcg q 2wks ( last given 04/04/19)   Venofer 100 mg loading x 10 (given 2 @ op Kid center)    Assessment/Plan 1. SOB with COVID positive ( dyspnea despite home O2 for COPD)-  CXR not that remarkable.  Started on steroids/inhalers/antitussives 2. ESRD - Has not had OP HD since Friday should look to do HD when can- finally getting today - 6 days later.  unfortunately will need again fairly soon-  We are doing HD on Sunday due to Thanksgiving holiday so will look to do HD again then 3. Hypertension/volume  - bp stable , at OP EDW / no vol reported on CXR  , plans for hd as soon as can do ,prob lower edw with wt loss with covid  Fu bps /wts / uses midodrine -  Attempt 3 liters UF wth HD today  4. Anemia  Of ESRD - hgb  11  No current esa needs  5. Metabolic bone disease -  Po vit d on hd / binders with meals - rocaltrol and phoslo 6. COPD - on inhaler/ meds     KLouis Meckel   Labs: Basic Metabolic Panel: Recent Labs  Lab 04/13/19 1230 04/14/19 0430 04/15/19 0420  NA 139 137 138  K 3.7 4.1 3.4*  CL 114* 110 109  CO2 12* 11* 11*  GLUCOSE 77 94 76  BUN 68* 70* 86*  CREATININE 11.56* 12.30* 13.82*  CALCIUM 8.3* 8.1*  8.2*  PHOS  --  9.4* 10.1*   Liver Function Tests: Recent Labs  Lab 04/14/19 0430 04/15/19 0420  AST 16 17  ALT 12 12  ALKPHOS 41 43  BILITOT 0.5 0.6  PROT 5.9* 6.0*  ALBUMIN 2.6* 2.8*   No results for input(s): LIPASE, AMYLASE in the last 168 hours. No results for input(s): AMMONIA in the last 168 hours. CBC: Recent Labs  Lab 04/11/19 0955 04/13/19 1230 04/14/19 0430 04/15/19 0420  WBC 5.4 5.2 3.6* 4.9  NEUTROABS  --  3.7 3.1 3.2  HGB 10.7* 11.1* 10.5* 11.0*  HCT 36.0 38.1 35.8* 36.8  MCV 107.1* 110.1* 107.5* 104.8*  PLT PLATELET CLUMPS NOTED ON SMEAR, COUNT APPEARS DECREASED 117* 112* 141*   Cardiac Enzymes: No results for input(s): CKTOTAL, CKMB, CKMBINDEX, TROPONINI in the last 168 hours. CBG: No results for input(s): GLUCAP in the last 168 hours.  Iron Studies:  Recent Labs    04/15/19 0420  FERRITIN 967*  Studies/Results: Ct Chest Wo Contrast  Result Date: 04/14/2019 CLINICAL DATA:  Shortness of breath.  COVID-19 positive. EXAM: CT CHEST WITHOUT CONTRAST TECHNIQUE: Multidetector CT imaging of the chest was performed following the standard protocol without IV contrast. COMPARISON:  Chest x-ray from yesterday. CTA chest dated Oct 13, 2018. FINDINGS: Cardiovascular: Normal heart size. No pericardial effusion. No thoracic aortic aneurysm. Coronary, aortic arch, and branch vessel atherosclerotic vascular disease. Unchanged left axillary vein stent. Mediastinum/Nodes: No enlarged mediastinal or axillary lymph nodes. Prior thyroidectomy. The trachea and esophagus demonstrate no significant findings. Lungs/Pleura: Unchanged moderate centrilobular emphysema. Mildly increased subsegmental atelectasis in the right lower lobe. No focal consolidation, pleural effusion, or pneumothorax. Unchanged 7 x 4 mm ground-glass nodule in the left upper lobe (series 6, image 70), stable since 2017, benign. No new pulmonary nodule. Upper Abdomen: No acute abnormality. Musculoskeletal: No  chest wall mass or suspicious bone lesions identified. IMPRESSION: 1.  No acute intrathoracic process. 2.  Emphysema (ICD10-J43.9). 3.  Aortic atherosclerosis (ICD10-I70.0). Electronically Signed   By: Titus Dubin M.D.   On: 04/14/2019 11:10   Dg Chest Port 1 View  Result Date: 04/13/2019 CLINICAL DATA:  73 year old female with increasing shortness of breath. EXAM: PORTABLE CHEST 1 VIEW COMPARISON:  Chest radiograph dated 04/11/2019 and CT dated 10/13/2018. FINDINGS: Background of emphysema. No focal consolidation, pleural effusion, or pneumothorax. Right lung base atelectasis/scarring. There is mild cardiomegaly. No acute osseous pathology. Left axillary vascular stent and thyroidectomy surgical clips. IMPRESSION: 1. No acute cardiopulmonary process. 2. Emphysema. Electronically Signed   By: Anner Crete M.D.   On: 04/13/2019 11:53   Medications: Infusions: . sodium chloride    . sodium chloride      Scheduled Medications: . albuterol  2 puff Inhalation Q6H  . apixaban  2.5 mg Oral BID  . calcitRIOL  0.25 mcg Oral Q M,W,F-HD  . calcium acetate  667 mg Oral TID WC  . Chlorhexidine Gluconate Cloth  6 each Topical Q0600  . fluticasone  1-2 spray Each Nare Daily  . fluticasone furoate-vilanterol  1 puff Inhalation Daily  . levothyroxine  125 mcg Oral Q0600  . lidocaine-prilocaine  1 application Topical Q M,W,F-HD  . midodrine  5 mg Oral Q M,W,F  . predniSONE  5 mg Oral Q breakfast  . sodium chloride flush  3 mL Intravenous Q12H  . umeclidinium bromide  1 puff Inhalation Daily  . vitamin C  500 mg Oral Daily  . zinc sulfate  220 mg Oral Daily    have reviewed scheduled and prn medications.  Physical Exam: Patient is not personally examined.  Reviewed notes with physical exam from other providers.  This is in effort to preserve PPE and to limit exposure to all providers   04/15/2019,8:40 AM  LOS: 2 days

## 2019-04-15 NOTE — TOC Initial Note (Signed)
Transition of Care [facility]) - Initial/Assessment Note    Patient Details  Name: Natasha [name] MRN: [id number] Date of Birth: 05/04/1946  Transition of Care [facility]) CM/SW Contact:    Maryclare Labrador, RN Phone Number: 04/15/2019, 1:44 PM  Clinical Narrative:  CM could not reach pt via phone - CM spoke with wife.  Pt is ESRD on outpt HD at [facility] location.  Pt has already been set up at [facility].  Per husband - husband transports to and from HD.  PTA independent with mobility.  Husband denied barriers with pt returning home.  High Readmission assessment initiated.  . Pt is aware of need to dialyze at new COVID positive facility.                     Expected Discharge Plan: Home/Self Care     Patient Goals and CMS Choice        Expected Discharge Plan and Services Expected Discharge Plan: Home/Self Care       Living arrangements for the past 2 months: Single Family Home                                      Prior Living Arrangements/Services Living arrangements for the past 2 months: Single Family Home Lives with:: Spouse Patient language and need for interpreter reviewed:: Yes        Need for Family Participation in Patient Care: Yes (Comment) Care giver support system in place?: Yes (comment)   Criminal Activity/Legal Involvement Pertinent to Current Situation/Hospitalization: No - Comment as needed  Activities of Daily Living Home Assistive Devices/Equipment: None ADL Screening (condition at time of admission) Patient's cognitive ability adequate to safely complete daily activities?: Yes Is the patient deaf or have difficulty hearing?: No Does the patient have difficulty seeing, even when wearing glasses/contacts?: No Does the patient have difficulty concentrating, remembering, or making decisions?: No Patient able to express need for assistance with ADLs?: Yes Does the patient have difficulty dressing or bathing?: No Independently performs  ADLs?: Yes (appropriate for developmental age) Does the patient have difficulty walking or climbing stairs?: Yes Weakness of Legs: Both Weakness of Arms/Hands: None  Permission Sought/Granted                  Emotional Assessment              Admission diagnosis:  ESRD (end stage renal disease) ([place]) [N18.6] Pulmonary emphysema, unspecified emphysema type ([place]) [J43.9] COVID-19 [U07.1] Patient Active Problem List   Diagnosis Date Noted  . COVID-19 virus infection 04/13/2019  . Acute on chronic respiratory failure with hypoxia ([place]) 04/13/2019  . Chronic anticoagulation 04/13/2019  . Medication management 02/22/2019  . Chronic respiratory failure with hypoxia ([place]) 09/23/2018  . Hx of Pulmonary embolism ([place]) 05/28/2018  . COPD with acute exacerbation ([place]) 03/31/2018  . Acute sinusitis 03/31/2018  . Crohn's disease of colon with complication ([place])   . Aortic insufficiency 07/30/2016  . Atherosclerosis of aorta ([place]) 07/30/2016  . Pressure injury of skin 07/14/2016  . Gastroenteritis, acute 07/12/2016  . COPD (chronic obstructive pulmonary disease) ([place]) 10/12/2015  . Malnutrition of moderate degree ([place]) 09/13/2014  . history of Left leg DVT 09/10/2014  . Hypotension 09/10/2014  . Hypoglycemia 09/10/2014  . Ischiorectal abscess 09/09/2014  . Increased anion gap metabolic acidosis   . Generalized abdominal pain   . Chronic  diastolic CHF (congestive heart failure) ([place])   . Hypertensive heart disease   . Secondary hyperparathyroidism ([place])   . Other specified hypothyroidism   . Right shoulder pain   . Thrombocytopenia ([place])   . Severe protein-calorie malnutrition ([place])   . Thyroid activity decreased   . Emphysema of lung ([place])   . Nephrolithiasis 03/29/2014  . Spinal stenosis of lumbar region 11/01/2013  . Bilateral leg pain 09/28/2013  . Emphysema/COPD ([place])   . Acute dyspnea 09/20/2013  . Protein-calorie malnutrition, severe ([place]) 05/14/2013  . Salmonella  enteritidis 06/11/2012  . Hypocalcemia 06/10/2012  . Hypothyroidism 06/10/2012  . Anemia of chronic disease 06/09/2012  . Hypomagnesemia 06/09/2012  . ESRD on dialysis ([place]) 06/09/2012  . Dehydration 06/07/2012  . Crohn's disease without complication ([place]) 87/27/6184  . Metabolic acidosis 85/92/7639  . Hypokalemia 06/07/2012  . Nausea vomiting and diarrhea 06/07/2012  . UTI (lower urinary tract infection) 06/07/2012   PCP:  [name], MD Pharmacy:   [facility] [place], [address] [place] [address] Phone: [phone] Fax: [phone]  [facility] [phone] - [place], [place] - [address] AT [address] [facility] [address] Phone: [phone] Fax: [phone]  [facility] [place], [place] - [address] [place] [address] Phone: [phone] Fax: [phone]     Social Determinants of Health (SDOH) Interventions    Readmission Risk Interventions Readmission Risk Prevention Plan 04/15/2019  Transportation Screening Complete  Medication Review (RN Care Manager) Complete  Palliative Care Screening Not [place] Not Applicable  Some recent data might be hidden

## 2019-04-16 LAB — CULTURE, BLOOD (ROUTINE X 2)
Culture: NO GROWTH
Culture: NO GROWTH
Special Requests: ADEQUATE

## 2019-04-16 LAB — COMPREHENSIVE METABOLIC PANEL
ALT: 11 U/L (ref 0–44)
AST: 20 U/L (ref 15–41)
Albumin: 2.4 g/dL — ABNORMAL LOW (ref 3.5–5.0)
Alkaline Phosphatase: 39 U/L (ref 38–126)
Anion gap: 17 — ABNORMAL HIGH (ref 5–15)
BUN: 47 mg/dL — ABNORMAL HIGH (ref 8–23)
CO2: 19 mmol/L — ABNORMAL LOW (ref 22–32)
Calcium: 7.5 mg/dL — ABNORMAL LOW (ref 8.9–10.3)
Chloride: 102 mmol/L (ref 98–111)
Creatinine, Ser: 9.13 mg/dL — ABNORMAL HIGH (ref 0.44–1.00)
GFR calc Af Amer: 4 mL/min — ABNORMAL LOW (ref >60)
GFR calc non Af Amer: 4 mL/min — ABNORMAL LOW (ref >60)
Glucose, Bld: 93 mg/dL (ref 70–99)
Potassium: 2.6 mmol/L — CL (ref 3.5–5.1)
Sodium: 138 mmol/L (ref 135–145)
Total Bilirubin: 0.4 mg/dL (ref 0.3–1.2)
Total Protein: 5.5 g/dL — ABNORMAL LOW (ref 6.5–8.1)

## 2019-04-16 LAB — C-REACTIVE PROTEIN: CRP: 11.8 mg/dL — ABNORMAL HIGH (ref <1.0)

## 2019-04-16 LAB — CBC WITH DIFFERENTIAL/PLATELET
Abs Immature Granulocytes: 0.06 10*3/uL (ref 0.00–0.07)
Basophils Absolute: 0 10*3/uL (ref 0.0–0.1)
Basophils Relative: 0 %
Eosinophils Absolute: 0 10*3/uL (ref 0.0–0.5)
Eosinophils Relative: 1 %
HCT: 31.9 % — ABNORMAL LOW (ref 36.0–46.0)
Hemoglobin: 10 g/dL — ABNORMAL LOW (ref 12.0–15.0)
Immature Granulocytes: 2 %
Lymphocytes Relative: 29 %
Lymphs Abs: 0.9 10*3/uL (ref 0.7–4.0)
MCH: 31.2 pg (ref 26.0–34.0)
MCHC: 31.3 g/dL (ref 30.0–36.0)
MCV: 99.4 fL (ref 80.0–100.0)
Monocytes Absolute: 0.7 10*3/uL (ref 0.1–1.0)
Monocytes Relative: 24 %
Neutro Abs: 1.4 10*3/uL — ABNORMAL LOW (ref 1.7–7.7)
Neutrophils Relative %: 44 %
Platelets: 105 10*3/uL — ABNORMAL LOW (ref 150–400)
RBC: 3.21 MIL/uL — ABNORMAL LOW (ref 3.87–5.11)
RDW: 16.1 % — ABNORMAL HIGH (ref 11.5–15.5)
WBC: 3.1 10*3/uL — ABNORMAL LOW (ref 4.0–10.5)
nRBC: 1 % — ABNORMAL HIGH (ref 0.0–0.2)

## 2019-04-16 LAB — PHOSPHORUS: Phosphorus: 6.4 mg/dL — ABNORMAL HIGH (ref 2.5–4.6)

## 2019-04-16 LAB — FERRITIN: Ferritin: 982 ng/mL — ABNORMAL HIGH (ref 11–307)

## 2019-04-16 LAB — D-DIMER, QUANTITATIVE: D-Dimer, Quant: 2.11 ug/mL-FEU — ABNORMAL HIGH (ref 0.00–0.50)

## 2019-04-16 LAB — MAGNESIUM: Magnesium: 1.6 mg/dL — ABNORMAL LOW (ref 1.7–2.4)

## 2019-04-16 MED ORDER — POTASSIUM CHLORIDE CRYS ER 20 MEQ PO TBCR
40.0000 meq | EXTENDED_RELEASE_TABLET | Freq: Two times a day (BID) | ORAL | Status: DC
  Administered 2019-04-16: 40 meq via ORAL
  Filled 2019-04-16 (×2): qty 2

## 2019-04-16 MED ORDER — MAGNESIUM OXIDE 400 (241.3 MG) MG PO TABS
400.0000 mg | ORAL_TABLET | Freq: Once | ORAL | Status: AC
  Administered 2019-04-16: 400 mg via ORAL
  Filled 2019-04-16: qty 1

## 2019-04-16 MED ORDER — ZINC SULFATE 220 (50 ZN) MG PO CAPS: 220.0000 mg | ORAL_CAPSULE | Freq: Every day | ORAL | 0 refills | Status: DC

## 2019-04-16 MED ORDER — ASCORBIC ACID 500 MG PO TABS: 500.0000 mg | ORAL_TABLET | Freq: Every day | ORAL | 0 refills | Status: AC

## 2019-04-16 MED ORDER — POTASSIUM CHLORIDE CRYS ER 20 MEQ PO TBCR
40.0000 meq | EXTENDED_RELEASE_TABLET | Freq: Two times a day (BID) | ORAL | Status: AC
  Administered 2019-04-16: 40 meq via ORAL

## 2019-04-16 NOTE — Progress Notes (Addendum)
Subjective:  Finally got HD yesterday - removed 2500- heard that she was a little altered after yesterday -  BP soft and HR around 100-  Low grade temp -  K 2.6 this AM- getting repletion   Objective Vital signs in last 24 hours: Vitals:   04/15/19 1827 04/15/19 1903 04/15/19 2336 04/16/19 0752  BP: (!) 99/55  (!) 96/51 (!) 107/58  Pulse: (!) 105  (!) 105 93  Resp: (!) 22 18 18 20   Temp: 100 F (37.8 C)  98.6 F (37 C) 98.4 F (36.9 C)  TempSrc: Oral  Oral Oral  SpO2: 95%  100% 100%  Weight:      Height:       Weight change: -0.3 kg  Intake/Output Summary (Last 24 hours) at 04/16/2019 5625 Last data filed at 04/15/2019 1145 Gross per 24 hour  Intake -  Output 2500 ml  Net -2500 ml    Dialysis Orders: Center: [facility]   on [facility] . But now due to [place] will be TTS at [facility] EDW 51.0 HD Bath 3k/2.25Ca  Time 4hr Heparin 4200.   LUA AVGG     Hectorol 3.10mg IV/HD  Mircera 75 mcg q 2wks ( last given 04/04/19)   Venofer 100 mg loading x 10 (given 2 @ op Kid center)    Assessment/Plan 1. SOB with COVID positive ( dyspnea despite home O2 for COPD)-  CXR not that remarkable.  Started on steroids/inhalers/antitussives 2. ESRD - Had not had OP HD since Friday 11/13 - finally had again on 11/20- labs better-  K actually low-  We are dialyzing Sunday (tomorrow) due to Thanksgiving holiday so will look to do HD again then- there may be some modification based on many COVID positive patients to run as separate treatments 3. Hypertension/volume  - bp stable- now soft , below OP EDW / no vol reported on CXR  , prob lower edw with wt loss with covid  Fu bps /wts / uses midodrine -  Attempt 1-2 liters with next HD  4. Anemia  Of ESRD - hgb  10-  No current esa needs  5. Metabolic bone disease -  Po vit d on hd / binders with meals - rocaltrol and phoslo- phos better after HD  6. COPD - on inhaler/ meds  7. Hypokalemia-  80 of repletion ordered today   ###Spoke to Dr. PFlorene Glen possibility of her going  home today.  If she does she could go to her next scheduled appt [facility] Monday at noon- to be there at 11:45-  Do not know transportation details.  If she does not go today- will run her here on Monday as discharge may be soon after and want to keep her on her new OP schedule     KLouis Meckel   Labs: Basic Metabolic Panel: Recent Labs  Lab 04/14/19 0430 04/15/19 0420 04/16/19 0352  NA 137 138 138  K 4.1 3.4* 2.6*  CL 110 109 102  CO2 11* 11* 19*  GLUCOSE 94 76 93  BUN 70* 86* 47*  CREATININE 12.30* 13.82* 9.13*  CALCIUM 8.1* 8.2* 7.5*  PHOS 9.4* 10.1* 6.4*   Liver Function Tests: Recent Labs  Lab 04/14/19 0430 04/15/19 0420 04/16/19 0352  AST 16 17 20   ALT 12 12 11   ALKPHOS 41 43 39  BILITOT 0.5 0.6 0.4  PROT 5.9* 6.0* 5.5*  ALBUMIN 2.6* 2.8* 2.4*   No results for input(s): LIPASE, AMYLASE in the  last 168 hours. Recent Labs  Lab 04/15/19 1504  AMMONIA 39*   CBC: Recent Labs  Lab 04/11/19 0955  04/13/19 1230 04/14/19 0430 04/15/19 0420 04/16/19 0352  WBC 5.4  --  5.2 3.6* 4.9 3.1*  NEUTROABS  --    < > 3.7 3.1 3.2 1.4*  HGB 10.7*  --  11.1* 10.5* 11.0* 10.0*  HCT 36.0  --  38.1 35.8* 36.8 31.9*  MCV 107.1*  --  110.1* 107.5* 104.8* 99.4  PLT PLATELET CLUMPS NOTED ON SMEAR, COUNT APPEARS DECREASED  --  117* 112* 141* 105*   < > = values in this interval not displayed.   Cardiac Enzymes: No results for input(s): CKTOTAL, CKMB, CKMBINDEX, TROPONINI in the last 168 hours. CBG: No results for input(s): GLUCAP in the last 168 hours.  Iron Studies:  Recent Labs    04/16/19 0352  FERRITIN 982*   Studies/Results: Ct Chest Wo Contrast  Result Date: 04/14/2019 CLINICAL DATA:  Shortness of breath.  COVID-19 positive. EXAM: CT CHEST WITHOUT CONTRAST TECHNIQUE: Multidetector CT imaging of the chest was performed following the standard protocol without IV contrast. COMPARISON:  Chest x-ray from yesterday. CTA chest dated Oct 13, 2018.  FINDINGS: Cardiovascular: Normal heart size. No pericardial effusion. No thoracic aortic aneurysm. Coronary, aortic arch, and branch vessel atherosclerotic vascular disease. Unchanged left axillary vein stent. Mediastinum/Nodes: No enlarged mediastinal or axillary lymph nodes. Prior thyroidectomy. The trachea and esophagus demonstrate no significant findings. Lungs/Pleura: Unchanged moderate centrilobular emphysema. Mildly increased subsegmental atelectasis in the right lower lobe. No focal consolidation, pleural effusion, or pneumothorax. Unchanged 7 x 4 mm ground-glass nodule in the left upper lobe (series 6, image 70), stable since 2017, benign. No new pulmonary nodule. Upper Abdomen: No acute abnormality. Musculoskeletal: No chest wall mass or suspicious bone lesions identified. IMPRESSION: 1.  No acute intrathoracic process. 2.  Emphysema (ICD10-J43.9). 3.  Aortic atherosclerosis (ICD10-I70.0). Electronically Signed   By: Titus Dubin M.D.   On: 04/14/2019 11:10   Dg Chest Port 1 View  Result Date: 04/15/2019 CLINICAL DATA:  Shortness of breath. EXAM: PORTABLE CHEST 1 VIEW COMPARISON:  Chest x-ray dated 04/13/2019 and chest CT dated 04/14/2019 FINDINGS: The heart size and pulmonary vascularity are normal. Chronic diffuse accentuation of the interstitial markings, unchanged. No acute infiltrates or effusions. No significant bone abnormality. Vascular stent in the left axilla. IMPRESSION: No acute abnormalities. Chronic interstitial lung disease. Emphysema demonstrated on the prior CT scan. Electronically Signed   By: Lorriane Shire M.D.   On: 04/15/2019 13:19   Medications: Infusions: . sodium chloride    . sodium chloride      Scheduled Medications: . albuterol  2 puff Inhalation Q6H  . apixaban  2.5 mg Oral BID  . calcitRIOL  0.25 mcg Oral Q M,W,F-HD  . calcium acetate  667 mg Oral TID WC  . Chlorhexidine Gluconate Cloth  6 each Topical Q0600  . fluticasone  1-2 spray Each Nare Daily  .  fluticasone furoate-vilanterol  1 puff Inhalation Daily  . levothyroxine  125 mcg Oral Q0600  . lidocaine-prilocaine  1 application Topical Q M,W,F-HD  . midodrine  5 mg Oral Q M,W,F  . potassium chloride  40 mEq Oral BID  . predniSONE  5 mg Oral Q breakfast  . sodium chloride flush  3 mL Intravenous Q12H  . umeclidinium bromide  1 puff Inhalation Daily  . vitamin C  500 mg Oral Daily  . zinc sulfate  220 mg Oral Daily  have reviewed scheduled and prn medications.  Physical Exam: Patient is not personally examined.  Reviewed notes with physical exam from other providers.  This is in effort to preserve PPE and to limit exposure to all providers   04/16/2019,8:34 AM  LOS: 3 days

## 2019-04-16 NOTE — Discharge Instructions (Signed)
You tested positive on 11/16.  You should quarantine until December 7th.  Please call the health department or your PCP if you have questions regarding your quarantine.      Person Under Monitoring Name: Natasha [name]  Location: [place] [address]   Infection Prevention Recommendations for Individuals Confirmed to have, or Being Evaluated for, 2019 Novel Coronavirus (COVID-19) Infection Who Receive Care at Home  Individuals who are confirmed to have, or are being evaluated for, COVID-19 should follow the prevention steps below until Natasha [name] [name] provider or local or state health department says they can return to normal activities.  Stay home except to get medical care You should restrict activities outside your home, except for getting medical care. Do not go to work, school, or public areas, and do not use public transportation or taxis.  Call ahead before visiting your doctor Before your medical appointment, call the [name] provider and tell them that you have, or are being evaluated for, COVID-19 infection. This will help the [name] providers office take steps to keep other people from getting infected. Ask your [name] provider to call the local or state health department.  Monitor your symptoms Seek prompt medical attention if your illness is worsening (e.g., difficulty breathing). Before going to your medical appointment, call the [name] provider and tell them that you have, or are being evaluated for, COVID-19 infection. Ask your [name] provider to call the local or state health department.  Wear Natasha [name] facemask You should wear Natasha [name] facemask that covers your nose and mouth when you are in the same room with other people and when you visit Natasha [name] [name] provider. People who live with or visit you should also wear Natasha [name] facemask while they are in the same room with you.  Separate yourself from other people in your home As much as possible, you  should stay in Natasha [name] different room from other people in your home. Also, you should use Natasha [name] separate bathroom, if available.  Avoid sharing household items You should not share dishes, drinking glasses, cups, eating utensils, towels, bedding, or other items with other people in your home. After using these items, you should wash them thoroughly with soap and water.  Cover your coughs and sneezes Cover your mouth and nose with Natasha [name] tissue when you cough or sneeze, or you can cough or sneeze into your sleeve. Throw used tissues in Natasha [name] lined trash can, and immediately wash your hands with soap and water for at least 20 seconds or use an alcohol-based hand rub.  Wash your Natasha [name] your hands often and thoroughly with soap and water for at least 20 seconds. You can use an alcohol-based hand sanitizer if soap and water are not available and if your hands are not visibly dirty. Avoid touching your eyes, nose, and mouth with unwashed hands.   Prevention Steps for Caregivers and Household Members of Individuals Confirmed to have, or Being Evaluated for, COVID-19 Infection Being Cared for in the Home  If you live with, or provide care at home for, Natasha [name] person confirmed to have, or being evaluated for, COVID-19 infection please follow these guidelines to prevent infection:  Follow [name] providers instructions Make sure that you understand and can help the patient follow any [name] provider instructions for all care.  Provide for the patients basic needs You should help the patient with basic needs in the home and provide support for getting groceries, prescriptions, and other personal needs.  Monitor the patients symptoms If  they are getting sicker, call his or her medical provider and tell them that the patient has, or is being evaluated for, COVID-19 infection. This will help the [name] providers office take steps to keep other people from getting infected. Ask the [name] provider  to call the local or state health department.  Limit the number of people who have contact with the patient  If possible, have only one caregiver for the patient.  Other household members should stay in another home or place of residence. If this is not possible, they should stay  in another room, or be separated from the patient as much as possible. Use Natasha [name] separate bathroom, if available.  Restrict visitors who do not have an essential need to be in the home.  Keep older adults, very young children, and other sick people away from the patient Keep older adults, very young children, and those who have compromised immune systems or chronic health conditions away from the patient. This includes people with chronic heart, lung, or kidney conditions, diabetes, and cancer.  Ensure good ventilation Make sure that shared spaces in the home have good air flow, such as from an air conditioner or an opened window, weather permitting.  Wash your hands often  Wash your hands often and thoroughly with soap and water for at least 20 seconds. You can use an alcohol based hand sanitizer if soap and water are not available and if your hands are not visibly dirty.  Avoid touching your eyes, nose, and mouth with unwashed hands.  Use disposable paper towels to dry your hands. If not available, use dedicated cloth towels and replace them when they become wet.  Wear Natasha [name] facemask and gloves  Wear Natasha [name] disposable facemask at all times in the room and gloves when you touch or have contact with the patients blood, body fluids, and/or secretions or excretions, such as sweat, saliva, sputum, nasal mucus, vomit, urine, or feces.  Ensure the mask fits over your nose and mouth tightly, and do not touch it during use.  Throw out disposable facemasks and gloves after using them. Do not reuse.  Wash your hands immediately after removing your facemask and gloves.  If your personal clothing becomes contaminated, carefully  remove clothing and launder. Wash your hands after handling contaminated clothing.  Place all used disposable facemasks, gloves, and other waste in Silvana Holecek lined container before disposing them with other household waste.  Remove gloves and wash your hands immediately after handling these items.  Do not share dishes, glasses, or other household items with the patient  Avoid sharing household items. You should not share dishes, drinking glasses, cups, eating utensils, towels, bedding, or other items with Charlena Haub patient who is confirmed to have, or being evaluated for, COVID-19 infection.  After the person uses these items, you should wash them thoroughly with soap and water.  Wash laundry thoroughly  Immediately remove and wash clothes or bedding that have blood, body fluids, and/or secretions or excretions, such as sweat, saliva, sputum, nasal mucus, vomit, urine, or feces, on them.  Wear gloves when handling laundry from the patient.  Read and follow directions on labels of laundry or clothing items and detergent. In general, wash and dry with the warmest temperatures recommended on the label.  Clean all areas the individual has used often  Clean all touchable surfaces, such as counters, tabletops, doorknobs, bathroom fixtures, toilets, phones, keyboards, tablets, and bedside tables, every day. Also, clean any surfaces that may have blood, body fluids,  and/or secretions or excretions on them.  Wear gloves when cleaning surfaces the patient has come in contact with.  Use Malvin Morrish diluted bleach solution (e.g., dilute bleach with 1 part bleach and 10 parts water) or Glendon Dunwoody household disinfectant with Chrisangel Eskenazi label that says EPA-registered for coronaviruses. To make Zinedine Ellner bleach solution at home, add 1 tablespoon of bleach to 1 quart (4 cups) of water. For Emiya Loomer larger supply, add  cup of bleach to 1 gallon (16 cups) of water.  Read labels of cleaning products and follow recommendations provided on product labels. Labels  contain instructions for safe and effective use of the cleaning product including precautions you should take when applying the product, such as wearing gloves or eye protection and making sure you have good ventilation during use of the product.  Remove gloves and wash hands immediately after cleaning.  Monitor yourself for signs and symptoms of illness Caregivers and household members are considered close contacts, should monitor their health, and will be asked to limit movement outside of the home to the extent possible. Follow the monitoring steps for close contacts listed on the symptom monitoring form.   ? If you have additional questions, contact your local health department or call the epidemiologist on call at [phone] (available 24/7). ? This guidance is subject to change. For the most up-to-date guidance from [facility], please refer to their website: YouBlogs.pl

## 2019-04-16 NOTE — Progress Notes (Signed)
SATURATION QUALIFICATIONS: (This note is used to comply with regulatory documentation for home oxygen)  Patient Saturations on Room Air at Rest =  N/A  Patient Saturations on Room Air while Ambulating = N/A  Patient Saturations on 3 Liters of oxygen while Ambulating = 93%  Please briefly explain why patient needs home oxygen:  Pt requires supplemental O2 to maintain SpO2 > 90%. SpO2 95% at rest on 3 L.   Lorrin Goodell, PT  Office # [phone] Pager [phone]

## 2019-04-16 NOTE — Discharge Summary (Signed)
**Note De-Identified vi Obfusction** Physicin Dischrge [place]  Natasha [name] CXK:[id number] DOB: My 30, 1947 DOA: 04/13/2019  PCP: Leeroy Ch, MD  Admit dte: 04/13/2019 Dischrge dte: 04/16/2019  Time spent: 40 minutes  Recommendtions for Outptient Follow-up:  1. Follow outptient CBC/CMP 2. Qurntine per Stte Frm guidelines (her qurntine should end Dec 7th, fter 21 dys given her immunocompromised sttus).   3. Dilysis pl[facility]ned [facility] t [facility], discussed with renl prior to d/c who will help rrnge 4. Encourged her to cll GI to discuss humir for her crohn's with her infection  5. 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 She presents with complints of progressively worsening shortness of breth over the lst week. Symptomsstrtederly lst week with complints of hedche, mylgis, nuse, vomiting, dirrhe,lower bck pin,nd productive cough. She reported initilly coughing up cler sputum, but it hd chnged to yellowish-green color. Her lst hemodilysis session ws 5 dys go. Denies ny significnt fever. She ws seen in the emergency deprtment 2 dys or symptoms nd dignosed with pneumoni. While in the ED she ws given vncomycin nd cefepime. After tlks with  nephrology they hd set her up to receive [facility] during dilysis. However,fter getting home ptient reports tht she felt worsend lter told tht she ws Covid positive.She hd reported hving dirrhe up to 7 times  dy, but it ppered to resolve fter being seen in the emergency deprtment. She ws titrting her oxygen up to the highest level without relief of her symptoms.Ptient notes tht her grndson hd been coming over nd his mother hd recently tested positive for Covid.  ED Course:Upon dmission into the emergency deprtment ptient ws noted to be febrile, pulse 105, respirtions 13-25, blood pressures mintined, nd O2 sturtions 100% on 4 L of nsl cnnul oxygen. Lbs reveled WBC 5.2, hemoglobin 11.1 , pltelets 117, BUN 68, cretinine 11.56, nd glucose 77.Chest x-ry showed edem without no cute bnormlity, but just 2 dys go there ws concern for right lower lobe pneumoni. Nephrology ws consulted nd we will dilyze the ptient when she gets to  room. TRH clled to dmit.  She ws dmitted for cute hypoxic respirtory filure 2/2 COVID.  She hd been seen in the ED 2 dys prior nd dignosed with pneumoni nd dischrged with plns for fortz during dilysis.  Represented due to worsening symptoms nd incresing her O2 t home.  She received  dose of steroids nd remdesivir, but these were stopped s her chest CT ws not remrkble for pneumoni nd her O2 sts were t bseline.  She received HD on 11/20 fter missing HD for severl sessions prior to dmission, she ws ltered fter HD, suspected relted to hypotension.  This grdully resolved.  On 11/21, pt t bseline.  Dischrged home with pln for dilysis on 11/23.   Hospitl Course:  Acute Metbolic Encephlopthy: fter **Note De-Identified vi Obfusction** dilysis on 11/20, pt ws confused.  Lethrgic.  Nonfocl exm, moving ll extremities.  BG wnl.  mmoni slightly elevted.  b12 nd folte wnl.  TSH wnl.  She hs documented  hypotension erlier this M.  ? If encephlopthic episode relted to these low blood pressures noted between 8 nd 10 M.  Reevluted lter in dy nd she seemed bck to her bseline.  Will continue to monitor.  She continues to be t bseline on 11/21.  pproprite for dischrge.  Tchycrdi: follow EKG (notble for sinus tch).  She hs elevted tempertures.  Could be relted to volume removl fter dilysis vs fever with orl temp of 100.1 recorded t 1145.  Improved tody with HR in 90's nd low 100's.  Follow outptient.  Hypotension: suspect relted to dilysis.  She's on midodrine on dilysis dys.  Will continue to monitor.  BP improved on dy of dischrge.  SBP 108 prior to d/c.  cute on chronic respirtory filurewith hypoxi, COVID-19 virus infection: cute. Ptient dignosed with Covid pneumonion 04/11/2019. CXR on 11/16 with ill defined opcity medil R bse, concerning for focus of pneumoni CXR 11/18 without cute bnormlities CT scn 11/19 lso without cute intrthorcic process CXR 11/20 without cute bnormlity - continues on home O2 - She ws strted on dexmethsone 6 mg dily nd received dose of remdesivir on 11/19 s well - hold dditionl doses fter tody nd follow given no findings on CT - dily inflmmtory lbs - I/O, dily weights - she hs H nd bck pin, ? Mylgis nd H relted to covid - follow  - She's feeling well nd bck to her bseline - inflmmtory lbs still elevted, but without ny concerning symptoms. she's pproprite for d/c home nd f/u s needed.  Cn follow inflmmtory lbs outptient.  Discussed with pt, cll GI nd sk bout holding humir while qurntined.    COVID-19 Lbs  Recent Lbs    04/14/19 0430 04/15/19 0420 04/16/19 0352  DDIMER 2.09* 2.18* 2.11*  FERRITIN 836* 967* 982*  CRP 9.6* 7.8* 11.8*    Lb Results  Component Vlue Dte   SRSCOV2N POSITIVE () 04/11/2019   SRSCOV2N NOT DETECTED  03/25/2019   [place] NEGTIVE 10/13/2018    ESRD on IN:[facility] normlly dilyzes Mondy, Wednesdy, nd Fridys. Per my discussion she lst dilyzed Fridy prior to dmission. -Continue midodrine with hemodilysis -Continue home mediction regimen for secondry hyperprthyroidism -Nephrology consulted, pprecite recs  - pln for dilysis on 11/23 on Mondy t [facility] clinic - discussed with Dr. Moshe Cipro who ws ok with d/c  Mcrocytic nemi:Stble. Hemoglobin 11.1 on dmission. -Continue to monitor  Crohn's disese on chronic immunosuppressive therpy: Ptient is on steroids ndHumir. LstHumirinjection on 11/5. 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 -Continue Trilogy Ellipt  Thrombocytopeni:Chronic.Pltelet count 117.Denies ny reports of bleeding -Continue to monitor  Hypothyroidism -Continue levothyroxine  Hypoklemi: replced prior to d/c.  Follow outptient per renl.  Procedures: none  Consulttions:  renl  Dischrge Exm: Vitls:   04/16/19 0752 04/16/19 1507  BP: (!) 107/58 108/70  Pulse: 93 (!) 104  Resp: 20 18  Temp: 98.4 F (36.9 C)   SpO2: 100% 100%   Feels well, bck to bseline Discussed d/c pln - qurntine nd dilysis follow up.  Generl: No cute distress. Crdiovsculr: RRR Lungs: unlbored bdomen: Soft, nontender, nondistended  Neurologicl: lert nd oriented 3. Moves ll **Note De-Identified vi Obfusction** extremities 4. Crnil nerves II through XII grossly intct. Skin: Wrm nd dry. No rshes or lesions. Extremities: No clubbing or cynosis. No edem.  Dischrge Instructions   Dischrge Instructions    Cll MD for:  difficulty brething, hedche or visul disturbnces   Complete by: As directed    Cll MD for:  extreme ftigue   Complete by: As directed     Cll MD for:  hives   Complete by: As directed    Cll MD for:  persistnt dizziness or light-hededness   Complete by: As directed    Cll MD for:  persistnt nuse nd vomiting   Complete by: As directed    Cll MD for:  redness, tenderness, or signs of infection (pin, swelling, redness, odor or green/yellow dischrge round incision site)   Complete by: As directed    Cll MD for:  severe uncontrolled pin   Complete by: As directed    Cll MD for:  temperture >100.4   Complete by: As directed    Diet - low sodium hert helthy   Complete by: As directed    Dischrge instructions   Complete by: As directed    You were seen for COVID 19 infection.  Your chest x ry nd CT scns were ressuring.  You hd dilysis for the first time in bout 1 week.  You hd low blood pressure nd confusion fter dilysis, but this hs now resolved.  Becuse of your immune compromise, with crohn's disese nd end stge renl disese, you should qurntine for 21 dys fter your test.  This mens you should qurntine until December 7th (you need t lest 24 hours without symptoms to discontinue qurntine).  Plese cll the helth deprtment or your PCP with questions.  Plese follow up with dilysis t the [facility] s scheduled.  They should be in touch with you regrding the holidy schedule.  Your next dilysis session will be Mondy t [facility].  You need to be there t 11:45.      Plese touch bse with your provider who follows your for your crohn's disese regrding your humir.  Becuse of your COVID 19 infection, you my wnt to dely your next dose.  Return for new, recurrent, or worsening symptoms.  Plese sk your PCP to request records from this hospitliztion so they know wht ws done nd wht the next steps will be.   Increse ctivity slowly   Complete by: As directed    MyChrt COVID-19 home monitoring progrm   Complete by: Apr 16, 2019    Is the  ptient willing to use the Susnk for home monitoring?: Yes   Temperture monitoring   Complete by: Apr 16, 2019    After how mny dys would you like to receive  notifiction of this ptient's flowsheet entries?: 1     Allergies s of 04/16/2019      Rections   Mercptopurine Other (See Comments)   Cused pncretitis   Remicde [infliximb] Other (See Comments)   CAUSED JOINT PAIN      Mediction List    TAKE these medictions   cetminophen 500 MG tblet Commonly known s: TYLENOL Tke 1,000 mg by mouth every 6 (six) hours s needed for mild pin.   lbuterol 108 (90 Bse) MCG/ACT inhler Commonly known s: VENTOLIN HFA INHALE 2 PUFFS BY MOUTH EVERY 6 HOURS IF NEEDED FOR WHEEZING OR SHORTNESS OF BREATH   lendronte 70 MG tblet Commonly known **Note De-Identified vi Obfusction** s: FOSAMAX Tke 70 mg by mouth every Mondy.   scorbic cid 500 MG tblet Commonly known s: VITAMIN C Tke 1 tblet (500 mg totl) by mouth dily. Strt tking on: April 17, 2019   clcitRIOL 0.25 MCG cpsule Commonly known s: ROCALTROL Tke 0.25 mcg by mouth every Mondy, Wednesdy, nd Fridy with hemodilysis.   clcium cette 667 MG cpsule Commonly known s: PHOSLO Tke 1 cpsule (667 mg totl) by mouth 3 (three) times dily with mels.   cynocoblmin 1000 MCG/ML injection Commonly known s: (VITAMIN B-12) Inject 1,000 mcg into the muscle every 30 (thirty) dys.   Eliquis 5 MG Tbs tblet Generic drug: pixbn Tke 2.5 mg by mouth 2 (two) times dily. Tkes 1/2 tblet (2.54m) bid   fluticsone 50 MCG/ACT nsl spry Commonly known s: FLONASE Plce 1-2 sprys into both nostrils dily.   heprin 1000 UNIT/ML injection Inject 1,000 Units into the vein 3 (three) times  week.   Humir 40 MG/0.8ML Pskt Generic drug: Adlimumb Inject 40 mg into the skin every 14 (fourteen) dys.   HYDROcodone-cetminophen 5-325 MG tblet Commonly known s: Norco Tke 1 tblet by mouth every 6 (six)  hours s needed for moderte pin.   iprtropium-lbuterol 0.5-2.5 (3) MG/3ML Soln Commonly known s: DUONEB Tke 3 mLs by nebuliztion every 6 (six) hours s needed.   levothyroxine 125 MCG tblet Commonly known s: SYNTHROID Tke 125 mcg by mouth dily before brekfst.   lidocine-prilocine crem Commonly known s: EMLA Apply 1 ppliction topiclly 3 (three) times  week.   lopermide 2 MG cpsule Commonly known s: IMODIUM Tke 2 cpsules (4 mg totl) by mouth s needed for dirrhe or loose stools.   midodrine 5 MG tblet Commonly known s: PROAMATINE Tke 5 mg by mouth every Mondy, Wednesdy, nd Fridy. 5 mg PRIOR TO dilysis on Mon/Wed/Fri   Nebulizer Devi 1 Device by Does not pply route s needed.   nitroGLYCERIN 0.4 MG SL tblet Commonly known s: NITROSTAT Plce 1 tblet (0.4 mg totl) under the tongue every 5 (five) minutes s needed for chest pin.   ondnsetron 4 MG disintegrting tblet Commonly known s: Zofrn ODT Tke 1 tblet (4 mg totl) by mouth every 8 (eight) hours s needed for nuse or vomiting.   OXYGEN Inhle 2-3 L into the lungs See dmin instructions. Use every night nd during the dy s needed for shortness of breth   predniSONE 5 MG tblet Commonly known s: DELTASONE Tke 5 mg by mouth dily with brekfst.   predniSONE 10 MG tblet Commonly known s: DELTASONE Tke 10 mg by mouth s directed. DS 5   Trelegy Ellipt 100-62.5-25 MCG/INH Aepb Generic drug: Fluticsone-Umeclidin-Vilnt Inhle 1 puff into the lungs dily.   Trelegy Ellipt 100-62.5-25 MCG/INH Aepb Generic drug: Fluticsone-Umeclidin-Vilnt Inhle 1 puff into the lungs dily.   zinc sulfte 220 (50 Zn) MG cpsule Tke 1 cpsule (220 mg totl) by mouth dily. Strt tking on: April 17, 2019      Allergies  Allergen Rections  . Mercptopurine Other (See Comments)    Cused pncretitis  . Remicde [Infliximb] Other (See Comments)    CAUSED JOINT PAIN       The results of significnt dignostics from this hospitliztion (including imging, microbiology, ncillry nd lbortory) re listed below for reference.    Significnt Dignostic Studies: Ct Chest Wo Contrst  Result Dte: 04/14/2019 CLINICAL DATA:  Shortness of breth.  COVID-19 positive. EXAM: CT CHEST WITHOUT CONTRAST TECHNIQUE: Multidetector CT imging of the  chest was performed following the standard protocol without IV contrast. COMPARISON:  Chest x-ray from yesterday. CTA chest dated Oct 13, 2018. FINDINGS: Cardiovascular: Normal heart size. No pericardial effusion. No thoracic aortic aneurysm. Coronary, aortic arch, and branch vessel atherosclerotic vascular disease. Unchanged left axillary vein stent. Mediastinum/Nodes: No enlarged mediastinal or axillary lymph nodes. Prior thyroidectomy. The trachea and esophagus demonstrate no significant findings. Lungs/Pleura: Unchanged moderate centrilobular emphysema. Mildly increased subsegmental atelectasis in the right lower lobe. No focal consolidation, pleural effusion, or pneumothorax. Unchanged 7 x 4 mm ground-glass nodule in the left upper lobe (series 6, image 70), stable since 2017, benign. No new pulmonary nodule. Upper Abdomen: No acute abnormality. Musculoskeletal: No chest wall mass or suspicious bone lesions identified. IMPRESSION: 1.  No acute intrathoracic process. 2.  Emphysema (ICD10-J43.9). 3.  Aortic atherosclerosis (ICD10-I70.0). Electronically Signed   By: Titus Dubin M.D.   On: 04/14/2019 11:10   Dg Chest Port 1 View  Result Date: 04/15/2019 CLINICAL DATA:  Shortness of breath. EXAM: PORTABLE CHEST 1 VIEW COMPARISON:  Chest x-ray dated 04/13/2019 and chest CT dated 04/14/2019 FINDINGS: The heart size and pulmonary vascularity are normal. Chronic diffuse accentuation of the interstitial markings, unchanged. No acute infiltrates or effusions. No significant bone abnormality. Vascular stent in the left axilla.  IMPRESSION: No acute abnormalities. Chronic interstitial lung disease. Emphysema demonstrated on the prior CT scan. Electronically Signed   By: Lorriane Shire M.D.   On: 04/15/2019 13:19   Dg Chest Port 1 View  Result Date: 04/13/2019 CLINICAL DATA:  73 year old female with increasing shortness of breath. EXAM: PORTABLE CHEST 1 VIEW COMPARISON:  Chest radiograph dated 04/11/2019 and CT dated 10/13/2018. FINDINGS: Background of emphysema. No focal consolidation, pleural effusion, or pneumothorax. Right lung base atelectasis/scarring. There is mild cardiomegaly. No acute osseous pathology. Left axillary vascular stent and thyroidectomy surgical clips. IMPRESSION: 1. No acute cardiopulmonary process. 2. Emphysema. Electronically Signed   By: Anner Crete M.D.   On: 04/13/2019 11:53   Dg Chest Portable 1 View  Result Date: 04/11/2019 CLINICAL DATA:  Shortness of breath EXAM: PORTABLE CHEST 1 VIEW COMPARISON:  December 07, 2018 FINDINGS: There is ill-defined opacity in the medial right base, suspicious for pneumonia in this area. Elsewhere, the interstitium is mildly thickened but stable. No other areas suggesting consolidation. Heart is mildly enlarged with pulmonary vascularity normal. No adenopathy. Stent is noted in the left brachial region. There are surgical clips in the cervical-thoracic junction region. IMPRESSION: Ill-defined opacity medial right base, concerning for focus of pneumonia. No other similar areas of airspace opacity. Chronic interstitial thickening noted, stable. Stable cardiac prominence. No adenopathy evident. Electronically Signed   By: Lowella Grip III M.D.   On: 04/11/2019 11:12    Microbiology: Recent Results (from the past 240 hour(s))  Culture, blood (routine x 2)     Status: None   Collection Time: 04/11/19 11:20 AM   Specimen: BLOOD  Result Value Ref Range Status   Specimen Description BLOOD RIGHT ANTECUBITAL  Final   Special Requests   Final    BOTTLES DRAWN  AEROBIC AND ANAEROBIC Blood Culture adequate volume   Culture   Final    NO GROWTH 5 DAYS Performed at [facility], [address], [place], [place] [address]    Report Status 04/16/2019 FINAL  Final  SARS CORONAVIRUS 2 (TAT 6-24 HRS) Nasopharyngeal Nasopharyngeal Swab     Status: Abnormal   Collection Time: 04/11/19 11:21 AM   Specimen: Nasopharyngeal Swab **Note De-Identified vi Obfusction** Result Vlue Ref Rnge Sttus   SRS Coronvirus 2 POSITIVE () NEGTIVE Finl    Comment: EMILED TO rett Nip T 2049 04/11/2019 MCCORMICK K (NOTE) SRS-CoV-2 trget nucleic cids re DETECTED. The SRS-CoV-2 RN is generlly detectble in upper nd lower respirtory specimens during the cute phse of infection. Positive results re indictive of ctive infection with SRS-CoV-2. Clinicl  correltion with ptient history nd other dignostic informtion is necessry to determine ptient infection sttus. Positive results do  not rule out bcteril infection or co-infection with other viruses. The expected result is Negtive. Fct Sheet for Ptients: SugrRoll.be Fct Sheet for [place] Providers: https://www.woods-mthews.com/ This test is not yet pproved or clered by the [place] FD nd  hs been uthorized for detection nd/or dignosis of SRS-CoV-2 by FD under n Emergency Use uthoriztion (EU). This EU will remin  in effect (mening this test cn be used) for the durtion of the COVID-19 decl rtion under Section 564(b)(1) of the ct, 21 U.S.C. section 360bbb-3(b)(1), unless the uthoriztion is terminted or revoked sooner. Performed t [facility], [place] [address], [place], [place] [address]   MRS PCR Screening     Sttus: None   Collection Time: 04/11/19 11:32 M   Specimen: Nsl Mucos; Nsophryngel  Result Vlue Ref Rnge Sttus   MRS by PCR NEGTIVE NEGTIVE Finl    Comment:        The GeneXpert MRS ssy (FD pproved for NSL  specimens only), is one component of  comprehensive MRS coloniztion surveillnce progrm. It is not intended to dignose MRS infection nor to guide or monitor tretment for MRS infections. Performed t [facility], [place] [address] [place], [place] [address]   Culture, blood (routine x 2)     Sttus: None   Collection Time: 04/11/19  1:40 PM   Specimen: BLOOD RIGHT RM  Result Vlue Ref Rnge Sttus   Specimen Description BLOOD RIGHT RM  Finl   Specil Requests   Finl    BOTTLES DRWN EROBIC ND NEROBIC Blood Culture results my not be optiml due to n indequte volume of blood received in culture bottles   Culture   Finl    NO GROWTH 5 DYS Performed t [facility], [place] [address] [place], [place] [address]    Report Sttus 04/16/2019 FINL  Finl     Lbs: Bsic Metbolic Pnel: Recent Lbs  Lb 04/11/19 0955 04/13/19 1230 04/14/19 0430 04/15/19 0420 04/16/19 0352  N 138 139 137 138 138  K 3.1* 3.7 4.1 3.4* 2.6*  CL 104 114* 110 109 102  CO2 16* 12* 11* 11* 19*  GLUCOSE 77 77 94 76 93  BUN 50* 68* 70* 86* 47*  CRETININE 9.09* 11.56* 12.30* 13.82* 9.13*  CLCIUM 8.7* 8.3* 8.1* 8.2* 7.5*  MG  --   --  1.7 1.7 1.6*  PHOS  --   --  9.4* 10.1* 6.4*   Liver Function Tests: Recent Lbs  Lb 04/14/19 0430 04/15/19 0420 04/16/19 0352  ST 16 17 20   LT 12 12 11   LKPHOS 41 43 39  BILITOT 0.5 0.6 0.4  PROT 5.9* 6.0* 5.5*  LBUMIN 2.6* 2.8* 2.4*   No results for input(s): LIPSE, MYLSE in the lst 168 hours. Recent Lbs  Lb 04/15/19 1504  MMONI 39*   CBC: Recent Lbs  Lb 04/11/19 0955 04/13/19 1230 04/14/19 0430 04/15/19 0420 04/16/19 0352  WBC 5.4 5.2 3.6* 4.9 3.1*  NEUTROBS  --  3.7 3.1  3.2 1.4*  HGB 10.7* 11.1* 10.5* 11.0* 10.0*  HCT 36.0 38.1 35.8* 36.8 31.9*  MCV 107.1* 110.1* 107.5* 104.8* 99.4  PLT PLATELET CLUMPS NOTED ON SMEAR, COUNT APPEARS DECREASED 117* 112* 141* 105*   Cardiac Enzymes: No results  for input(s): CKTOTAL, CKMB, CKMBINDEX, TROPONINI in the last 168 hours. BNP: BNP (last 3 results) Recent Labs    05/28/18 1333  BNP 186.4*    ProBNP (last 3 results) No results for input(s): PROBNP in the last 8760 hours.  CBG: No results for input(s): GLUCAP in the last 168 hours.     Signed:  Fayrene Helper MD.  [facility] 04/16/2019, 6:26 PM

## 2019-04-16 NOTE — Evaluation (Signed)
Physical Therapy Evaluation Patient Details Name: Natasha [name] MRN: [id number] DOB: 03-Jan-1946 Today's Date: 04/16/2019   History of Present Illness  73 y.o. female with medical history significant of ESRD on HD(M/W/F), Crohn's disease, history of PE on chronic anticoagulation, hypothyroidism, COPD, oxygen dependent on 3 to 4 L, and anemia of chronic disease. Admitted with COVID.    Clinical Impression  Pt admitted with above diagnosis. On eval, pt required supervision transfers and min guard assist ambulation 75' x 2 without AD. Pt mobilized on 3L O2 with SpO2 >93%. Pt on 4L home O2 at baseline. She presented with steady gait. No LOB or SOB noted. Pt currently with functional limitations due to the deficits listed below (see PT Problem List). Pt will benefit from skilled PT to increase their independence and safety with mobility to allow discharge to the venue listed below.  PT to follow acutely if pt remains in hospital. No follow up services indicated.     Follow Up Recommendations No PT follow up;Supervision for mobility/OOB    Equipment Recommendations  None recommended by PT    Recommendations for Other Services       Precautions / Restrictions Precautions Precautions: Other (comment) Precaution Comments: watch sats      Mobility  Bed Mobility Overal bed mobility: Modified Independent                Transfers Overall transfer level: Needs assistance Equipment used: None Transfers: Sit to/from Stand;Stand Pivot Transfers Sit to Stand: Supervision Stand pivot transfers: Supervision       General transfer comment: supervision for safety. No physical assist.  Ambulation/Gait Ambulation/Gait assistance: Min guard Gait Distance (Feet): 75 Feet(x 2) Assistive device: None Gait Pattern/deviations: Step-through pattern;Decreased stride length Gait velocity: decreased   General Gait Details: steady gait. Ambulated on 3L O2 with SpO2 >93%. No SOB  noted.  Stairs            Wheelchair Mobility    Modified Rankin (Stroke Patients Only)       Balance Overall balance assessment: Mild deficits observed, not formally tested                                           Pertinent Vitals/Pain Pain Assessment: No/denies pain    Home Living Family/patient expects to be discharged to:: Private residence Living Arrangements: Spouse/significant other Available Help at Discharge: Family;Available 24 hours/day Type of Home: House Home Access: Level entry     Home Layout: One level Home Equipment: Walker - 2 wheels      Prior Function Level of Independence: Independent         Comments: Pt on 4L home O2 at baseline.     Hand Dominance        Extremity/Trunk Assessment   Upper Extremity Assessment Upper Extremity Assessment: Overall WFL for tasks assessed    Lower Extremity Assessment Lower Extremity Assessment: Overall WFL for tasks assessed    Cervical / Trunk Assessment Cervical / Trunk Assessment: Kyphotic  Communication   Communication: No difficulties  Cognition Arousal/Alertness: Awake/alert Behavior During Therapy: WFL for tasks assessed/performed Overall Cognitive Status: Within Functional Limits for tasks assessed                                        General  Comments General comments (skin integrity, edema, etc.): Pt on 3L O2 during session.    Exercises     Assessment/Plan    PT Assessment Patient needs continued PT services  PT Problem List Decreased mobility;Cardiopulmonary status limiting activity;Decreased activity tolerance       PT Treatment Interventions Therapeutic activities;Gait training;Therapeutic exercise;Patient/family education;Balance training;Functional mobility training    PT Goals (Current goals can be found in the Care Plan section)  Acute Rehab PT Goals Patient Stated Goal: home PT Goal Formulation: With patient Time For Goal  Achievement: 04/30/19 Potential to Achieve Goals: Good    Frequency Min 3X/week   Barriers to discharge        Co-evaluation               AM-PAC PT "6 Clicks" Mobility  Outcome Measure Help needed turning from your back to your side while in a flat bed without using bedrails?: None Help needed moving from lying on your back to sitting on the side of a flat bed without using bedrails?: None Help needed moving to and from a bed to a chair (including a wheelchair)?: None Help needed standing up from a chair using your arms (e.g., wheelchair or bedside chair)?: None Help needed to walk in hospital room?: A Little Help needed climbing 3-5 steps with a railing? : A Little 6 Click Score: 22    End of Session Equipment Utilized During Treatment: Oxygen Activity Tolerance: Patient tolerated treatment well Patient left: in bed;with call bell/phone within reach Nurse Communication: Mobility status PT Visit Diagnosis: Difficulty in walking, not elsewhere classified (R26.2)    Time: 8887-5797 PT Time Calculation (min) (ACUTE ONLY): 31 min   Charges:   PT Evaluation $PT Eval Low Complexity: 1 Low PT Treatments $Gait Training: 8-22 mins        Lorrin Goodell, PT  Office # [phone] Pager [phone]   Lorriane Shire 04/16/2019, 2:38 PM

## 2019-04-17 ENCOUNTER — Telehealth: Payer: Self-pay | Admitting: Nephrology

## 2019-04-17 NOTE — Telephone Encounter (Signed)
Transition of care contact from inpatient facility  Date of discharge: 04/16/19 Date of contact: 04/17/19 Method of contact: Phone Spoke to patient  Patient contacted to discuss transition of care from recent inpatient hospitalization. She was admitted to [facility] from 11/18-11/21/20 with discharge diagnosis of COVID-19 infection.  She denies dyspnea - no new symptoms.   Understands that she will be going to a new (COVID designated) HD unit beginning tomorrow. The unit nurse has made contact with her and gone over instructions. Transportation via [facility] was offered, sounds like she plans to have her husband drive her to and from the clinic instead. D/t both of them having COVID, they will remain in the car when arriving to the clinic and call the main line. When directed, the patient alone will be told to come into the clinic via the side door. She understands.  Med list reviewed. For HD tomorrow, as above. No other follow-up needed at this time.   Veneta Penton, PA-C [employer] Pager [phone]

## 2019-04-18 NOTE — Progress Notes (Signed)
Renal Navigator notes that patient discharged over the weekend and called patient to check in this morning regarding OP HD treatment at [facility] isolation shift. Renal Navigator notes Renal PA's note and spoke with PA prior to calling patient. Patient states she is feeling well today and plans to go to HD today. She states her husband will take her and understands that ambulance transport can be arranged from OP HD clinic as last resort. Patient was appreciative of call.  Alphonzo Cruise, [place] Renal Navigator [phone]

## 2019-05-03 ENCOUNTER — Other Ambulatory Visit: Payer: Self-pay

## 2019-05-03 DIAGNOSIS — R0789 Other chest pain: Secondary | ICD-10-CM

## 2019-05-03 MED ORDER — NITROGLYCERIN 0.4 MG SL SUBL: 0.4000 mg | SUBLINGUAL_TABLET | SUBLINGUAL | 3 refills | Status: AC | PRN

## 2019-05-13 ENCOUNTER — Telehealth: Payer: Self-pay | Admitting: Pulmonary Disease

## 2019-05-13 MED ORDER — TRELEGY ELLIPTA 100-62.5-25 MCG/INH IN AEPB: 1.0000 | INHALATION_SPRAY | Freq: Every day | RESPIRATORY_TRACT | 0 refills | Status: DC

## 2019-05-13 NOTE — Telephone Encounter (Signed)
Called and spoke with pt letting her know that I was going to put a sample of trelegy up front for her. Pt verbalized understanding. Nothing further needed.

## 2019-05-31 ENCOUNTER — Other Ambulatory Visit: Payer: Self-pay | Admitting: General Surgery

## 2019-05-31 DIAGNOSIS — Z86711 Personal history of pulmonary embolism: Secondary | ICD-10-CM

## 2019-05-31 DIAGNOSIS — J432 Centrilobular emphysema: Secondary | ICD-10-CM

## 2019-05-31 DIAGNOSIS — J9611 Chronic respiratory failure with hypoxia: Secondary | ICD-10-CM

## 2019-05-31 MED ORDER — ALBUTEROL SULFATE HFA 108 (90 BASE) MCG/ACT IN AERS: INHALATION_SPRAY | RESPIRATORY_TRACT | 12 refills | Status: AC

## 2019-05-31 NOTE — Progress Notes (Signed)
Fax received from [facility] requesting refill of Albuterol inhaler. Prescription sent.

## 2019-06-23 ENCOUNTER — Ambulatory Visit: Payer: Medicare Other | Admitting: Cardiovascular Disease

## 2019-06-23 ENCOUNTER — Encounter: Payer: Self-pay | Admitting: Cardiovascular Disease

## 2019-06-23 ENCOUNTER — Other Ambulatory Visit: Payer: Self-pay

## 2019-06-23 VITALS — BP 126/58 | HR 104 | Ht 64.0 in | Wt 112.4 lb

## 2019-06-23 DIAGNOSIS — I2699 Other pulmonary embolism without acute cor pulmonale: Secondary | ICD-10-CM

## 2019-06-23 DIAGNOSIS — I119 Hypertensive heart disease without heart failure: Secondary | ICD-10-CM | POA: Diagnosis not present

## 2019-06-23 NOTE — Patient Instructions (Signed)
Medication Instructions:  Your physician recommends that you continue on your current medications as directed. Please refer to the Current Medication list given to you today.  *If you need a refill on your cardiac medications before your next appointment, please call your pharmacy*  Lab Work: None Ordered If you have labs (blood work) drawn today and your tests are completely normal, you will receive your results only by: [name] MyChart Message (if you have MyChart) OR . A paper copy in the mail If you have any lab test that is abnormal or we need to change your treatment, we will call you to review the results.   Testing/Procedures: None Ordered   Follow-Up: At [facility], you and your health needs are our priority.  As part of our continuing mission to provide you with exceptional heart care, we have created designated Provider Care Teams.  These Care Teams include your primary Cardiologist (physician) and Advanced Practice Providers (APPs -  Physician Assistants and Nurse Practitioners) who all work together to provide you with the care you need, when you need it.  Your next appointment:   1 year(s)  The format for your next appointment:   In Person  Provider:   You may see Mertie Moores, MD or one of the following Advanced Practice Providers on your designated Care Team:    Richardson Dopp, PA-C  [place], [place]  Daune Perch, [place]

## 2019-06-23 NOTE — Progress Notes (Signed)
Cardiology Office Note:    Date:  06/23/2019   ID:  Natasha [name], DOB 1945-09-18, MRN [id number]  PCP:  Natasha Cha, MD  Cardiologist:   Arlette Schaad  Electrophysiologist:  None   Referring MD: Natasha [name],*   Chief Complaint  Patient presents with  . Congestive Heart Failure   Problem list 1.  Chronic diastolic congestive heart failure 2.  COPD 3.  Chronic kidney disease 4.  Aortic insufficiency 5.  Essential hypertension 6.  Chronic anemia 7.  History of DVT/pulmonary embolus.  History of Present Illness:    Natasha [name] is a 74 y.o. female with a hx of  Chronic systolic CHF Was admitted with COVID several months ago  Is now on dialysis  Is now on home O2.   3 liters per minute.   Still eats some salt.    She says that her favorite thing to eat are salty cheese puffs and she eats many of them every day. ( a whole famly sized bag after she comes home from dialysis )   Her LV fuction shows hyperdynamic left ventricular systolic function with an ejection fraction of greater than 65%.  She does have grade 1 diastolic dysfunction.  She has moderate pulmonary hypertension with an estimated PA pressure of 44 mmHg.   Past Medical History:  Diagnosis Date  . Anal fistula   . Anemia in chronic kidney disease (CKD)   . Aortic insufficiency    Echo 3/18: mod conc LVH, EF 60-65, no RWMA, Gr 1 DD, mod AI, mild MR, normal RVSF, Trivial TR  . Arthritis   . Borderline hypertension   . Bulging disc    L3-L4  . Chronic diarrhea    due to crohn's  . CKD (chronic kidney disease), stage IV ([place])    MWF- [address]  . Crohn's disease ([place])    chronic ileitis  . Dyspnea    with exertion  . Emphysema/COPD ([place])   . GERD (gastroesophageal reflux disease)    denies  . History of blood transfusion   . History of glaucoma   . History of kidney stones   . History of pancreatitis    2008--  mercaptopurine  . History of small bowel obstruction    12-03-2010   due to crohn's ileitis  . Hypertension    no longer on medications  . Hypothyroidism, postsurgical    multinodule w/ hurthle cells  . Osteoporosis   . Perianal Crohn's disease ([place])   . Peripheral vascular disease ([place])    blood clot behind knee left leg  . Polyarthralgia   . Seizures ([place])    03/2017  . Wears partial dentures    upper    Past Surgical History:  Procedure Laterality Date  . ABDOMINAL HYSTERECTOMY  1990   and  Appendectomy  . AV FISTULA PLACEMENT Left 12/08/2017   Procedure: INSERTION OF ARTERIOVENOUS (AV) GRAFT WITH ARTEGRAFT TO LEFT UPPER ARM;  Surgeon: Conrad Bret Harte, MD;  Location: [place];  Service: Vascular;  Laterality: Left;  . BASCILIC VEIN TRANSPOSITION Left 01/09/2017   Procedure: LEFT 1ST STAGE BRACHIAL VEIN TRANSPOSITION;  Surgeon: Conrad [place]ville, MD;  Location: [place];  Service: Vascular;  Laterality: Left;  . BASCILIC VEIN TRANSPOSITION Left 06/16/2017   Procedure: Second Stage BASILIC VEIN TRANSPOSITION  LEFT ARM;  Surgeon: Conrad Agu[place] Claras, MD;  Location: [place];  Service: Vascular;  Laterality: Left;  . BIOPSY  03/09/2018   Procedure: BIOPSY;  Surgeon: Wilford Corner, MD;  Location: [facility];  Service: Endoscopy;;  . CHOLECYSTECTOMY    . COLON RESECTION  x3 --  1978,  1987, 1989   ILEAL RESECTION x2/   [place]  . COLONOSCOPY WITH PROPOFOL N/A 09/25/2014   Procedure: COLONOSCOPY WITH PROPOFOL;  Surgeon: Garlan Fair, MD;  [facility]: [facility];  Service: Endoscopy;  Laterality: N/A;  . COLONOSCOPY WITH PROPOFOL N/A 03/09/2018   Procedure: COLONOSCOPY WITH PROPOFOL;  Surgeon: Wilford Corner, MD;  [facility];  Service: Endoscopy;  Laterality: N/A;  . CYSTOSCOPY W/ URETERAL STENT PLACEMENT Right 11/19/2016   Procedure: CYSTOSCOPY WITH RIGHT RETROGRADE PYELOGRAM/ URETEROSCOPY WITH LASER AND RIGHT URETERAL STENT PLACEMENT;  Surgeon: Ardis Hughs, MD;  [facility]: [facility];  Service: Urology;  Laterality: Right;  .  ESOPHAGOGASTRODUODENOSCOPY  03/04/2012   Procedure: ESOPHAGOGASTRODUODENOSCOPY (EGD);  Surgeon: Garlan Fair, MD;  [facility];  Service: Endoscopy;  Laterality: N/A;  . EXAMINATION UNDER ANESTHESIA N/A 09/12/2014   Procedure: EXAM UNDER ANESTHESIA;  Surgeon: Rolm Bookbinder, MD;  Location: [place];  Service: General;  Laterality: N/A;  . EYE SURGERY     laser  . FLEXIBLE SIGMOIDOSCOPY  03/04/2012   Procedure: FLEXIBLE SIGMOIDOSCOPY;  Surgeon: Garlan Fair, MD;  [facility];  Service: Endoscopy;  Laterality: N/A;  . GLAUCOMA SURGERY Bilateral   . HOLMIUM LASER APPLICATION Right 08/14/2246   Procedure: HOLMIUM LASER APPLICATION;  Surgeon: Ardis Hughs, MD;  [facility];  Service: Urology;  Laterality: Right;  . INCISION AND DRAINAGE PERIRECTAL ABSCESS N/A 09/12/2014   Procedure: IRRIGATION AND DEBRIDEMENT PERIRECTAL ABSCESS;  Surgeon: Rolm Bookbinder, MD;  Location: [place];  Service: General;  Laterality: N/A;  . IR FLUORO GUIDE CV LINE RIGHT  03/20/2017  . IR US GUIDE VASC ACCESS RIGHT  03/20/2017  . NEGATIVE SLEEP STUDY  2008  . PLACEMENT OF SETON N/A 12/01/2014   Procedure: PLACEMENT OF SETON;  Surgeon: [name], MD;  Location: [facility];  Service: General;  Laterality: N/A;  . POLYPECTOMY  03/09/2018   Procedure: POLYPECTOMY;  Surgeon: Wilford Corner, MD;  [facility];  Service: Endoscopy;;  . RECTAL EXAM UNDER ANESTHESIA N/A 12/01/2014   Procedure: RECTAL EXAM UNDER ANESTHESIA;  Surgeon: [name], MD;  Location[facility];  Service: General;  Laterality: N/A;  . TOTAL THYROIDECTOMY  10-13-2003  . TRANSTHORACIC ECHOCARDIOGRAM  07-11-2013   mild LVH,  ef 55%,  moderate AR,  mild MR and TR,  trivial PR  . TUBAL LIGATION      Current Medications: Current Meds  Medication Sig  . acetaminophen (TYLENOL) 500 MG tablet Take 1,000 mg by mouth every 6 (six) hours as needed for mild pain.   .  Adalimumab (HUMIRA) 40 MG/0.8ML PSKT Inject 40 mg into the skin every 14 (fourteen) days.   [name] albuterol (VENTOLIN HFA) 108 (90 Base) MCG/ACT inhaler INHALE 2 PUFFS BY MOUTH EVERY 6 HOURS IF NEEDED FOR WHEEZING OR SHORTNESS OF BREATH  . alendronate (FOSAMAX) 70 MG tablet Take 70 mg by mouth every Monday.   . benzonatate (TESSALON) 100 MG capsule Take 100 mg by mouth 3 (three) times daily as needed.  . calcitRIOL (ROCALTROL) 0.25 MCG capsule Take 0.25 mcg by mouth every Monday, Wednesday, and Friday with hemodialysis.  [name] calcium acetate (PHOSLO) 667 MG capsule Take 1 capsule (667 mg total) by mouth 3 (three) times daily with meals.  . calcium-vitamin D (OSCAL WITH D) 500-200 MG-UNIT TABS tablet Take 1 tablet  by mouth as needed.  . cyanocobalamin (,VITAMIN B-12,) 1000 MCG/ML injection Inject 1,000 mcg into the muscle every 30 (thirty) days.   [name] ELIQUIS 5 MG TABS tablet Take 2.5 mg by mouth 2 (two) times daily. Takes 1/2 tablet (2.36m) bid  . fluticasone (FLONASE) 50 MCG/ACT nasal spray Place 1-2 sprays into both nostrils daily.  . Fluticasone-Umeclidin-Vilant (TRELEGY ELLIPTA) 100-62.5-25 MCG/INH AEPB Inhale 1 puff into the lungs daily.  . heparin 1000 UNIT/ML injection Inject 1,000 Units into the vein 3 (three) times a week.  .Marland KitchenHYDROcodone-acetaminophen (NORCO) 5-325 MG tablet Take 1 tablet by mouth every 6 (six) hours as needed for moderate pain.  .Marland Kitchenipratropium-albuterol (DUONEB) 0.5-2.5 (3) MG/3ML SOLN Take 3 mLs by nebulization every 6 (six) hours as needed.  .Marland Kitchenlevothyroxine (SYNTHROID, LEVOTHROID) 125 MCG tablet Take 125 mcg by mouth daily before breakfast.   . lidocaine (XYLOCAINE) 5 % ointment Apply 1 application topically as needed.  . lidocaine-prilocaine (EMLA) cream Apply 1 application topically 3 (three) times a week.   . loperamide (IMODIUM) 2 MG capsule Take 2 capsules (4 mg total) by mouth as needed for diarrhea or loose stools.  . Methoxy PEG-Epoetin Beta (MIRCERA IJ) Inject 150 mg as  directed every 14 (fourteen) days.  . midodrine (PROAMATINE) 5 MG tablet Take 5 mg by mouth every Monday, Wednesday, and Friday. 5 mg PRIOR TO dialysis on Mon/Wed/Fri  . nitroGLYCERIN (NITROSTAT) 0.4 MG SL tablet Place 1 tablet (0.4 mg total) under the tongue every 5 (five) minutes as needed for chest pain.  .Marland Kitchenondansetron (ZOFRAN ODT) 4 MG disintegrating tablet Take 1 tablet (4 mg total) by mouth every 8 (eight) hours as needed for nausea or vomiting.  . OXYGEN Inhale 2-3 L into the lungs See admin instructions. Use every night and during the day as needed for shortness of breath  . predniSONE (DELTASONE) 5 MG tablet Take 5 mg by mouth daily with breakfast.  . promethazine (PHENERGAN) 25 MG tablet Take 25 mg by mouth as needed.  .Marland KitchenRespiratory Therapy Supplies (NEBULIZER) DEVI 1 Device by Does not apply route as needed.  . traMADol (ULTRAM) 50 MG tablet Take 50 mg by mouth as needed.  . zinc sulfate 220 (50 Zn) MG capsule Take 1 capsule (220 mg total) by mouth daily.     Allergies:   Mercaptopurine and Remicade [infliximab]   Social History   Socioeconomic History  . Marital status: Married    Spouse name: WTheodoro Doing . Number of children: 3  . Years of education: 165 . Highest education level: Not on file  Occupational History  . Occupation: Retired    [employer] farm manager RETIRED  Tobacco Use  . Smoking status: Former Smoker    Packs/day: 1.00    Years: 35.00    Pack years: 35.00    Types: Cigarettes    Start date: 02/22/1967    Quit date: 11/27/2001    Years since quitting: 17.5  . Smokeless tobacco: Never Used  Substance and Sexual Activity  . Alcohol use: No  . Drug use: No  . Sexual activity: Never  Other Topics Concern  . Not on file  Social History Narrative   Patient is married ([name] and lives at home with her husband.   Patient has three children.   Patient is retired on disability.   Patient drinks two cups of caffeine daily.   Patient is right-handed.         Social  Determinants of Health   Financial  Resource Strain:   . Difficulty of Paying Living Expenses: Not on file  Food Insecurity:   . Worried About Charity fundraiser in the Last Year: Not on file  . Ran Out of Food in the Last Year: Not on file  Transportation Needs:   . Lack of Transportation (Medical): Not on file  . Lack of Transportation (Non-Medical): Not on file  Physical Activity:   . Days of Exercise per Week: Not on file  . Minutes of Exercise per Session: Not on file  Stress:   . Feeling of Stress : Not on file  Social Connections:   . Frequency of Communication with Friends and Family: Not on file  . Frequency of Social Gatherings with Friends and Family: Not on file  . Attends Religious Services: Not on file  . Active Member of Clubs or Organizations: Not on file  . Attends Archivist Meetings: Not on file  . Marital Status: Not on file     Family History: The patient's family history includes COPD in her mother; Colon cancer in her brother; Diabetes in her brother; Diverticulitis in her mother; Heart attack in her brother, father, and mother; Heart disease in her father and mother; Hypertension in her father and mother; Thyroid disease in her brother.  ROS:   Please see the history of present illness.     All other systems reviewed and are negative.  EKGs/Labs/Other Studies Reviewed:    The following studies were reviewed today:   EKG:    Recent Labs: 04/15/2019: TSH 4.294 04/16/2019: ALT 11; BUN 47; Creatinine, Ser 9.13; Hemoglobin 10.0; Magnesium 1.6; Platelets 105; Potassium 2.6; Sodium 138  Recent Lipid Panel No results found for: CHOL, TRIG, HDL, CHOLHDL, VLDL, LDLCALC, LDLDIRECT  Physical Exam:    VS:  BP (!) 126/58   Pulse (!) 104   Ht 5' 4"  (1.626 m)   Wt 112 lb 6.4 oz (51 kg)   SpO2 99%   BMI 19.29 kg/m     Wt Readings from Last 3 Encounters:  06/23/19 112 lb 6.4 oz (51 kg)  04/15/19 106 lb 6 oz (48.2 kg)  03/29/19 114 lb (51.7 kg)       GEN:  Well nourished, well developed in no acute distress HEENT: Normal NECK: No JVD; No carotid bruits LYMPHATICS: No lymphadenopathy CARDIAC: RRR, no murmurs, rubs, gallops RESPIRATORY:  Clear to auscultation without rales, wheezing or rhonchi  ABDOMEN: Soft, non-tender, non-distended MUSCULOSKELETAL:  No edema; No deformity  SKIN: Warm and dry NEUROLOGIC:  Alert and oriented x 3 PSYCHIATRIC:  Normal affect   ASSESSMENT:    No diagnosis found. PLAN:    In order of problems listed above:  1. Chronic diastolic congestive heart failure: Her echocardiogram from several months ago reveals hyperdynamic left ventricular systolic function. She does have grade 1 diastolic dysfunction. She has moderate pulmonary hypertension that is likely due to her COPD.  In talking with her it is clear that she still eats a lot of salt. She states that she will eat a family size bag of cheese puffs on a daily basis. Of strongly encouraged her to find a different snack food. She understands that she needs to break this habit.  At this point I do not have any additional cardiac recommendations. She is now on dialysis. Her blood pressure is fairly well controlled. I have strongly advised her to eat a more healthy diet. We will see her again in 1 year.   Medication Adjustments/Labs  and Tests Ordered: Current medicines are reviewed at length with the patient today.  Concerns regarding medicines are outlined above.  No orders of the defined types were placed in this encounter.  No orders of the defined types were placed in this encounter.   Patient Instructions  Medication Instructions:  Your physician recommends that you continue on your current medications as directed. Please refer to the Current Medication list given to you today.  *If you need a refill on your cardiac medications before your next appointment, please call your pharmacy*  Lab Work: None Ordered If you have labs (blood work)  drawn today and your tests are completely normal, you will receive your results only by: [name] MyChart Message (if you have MyChart) OR . A paper copy in the mail If you have any lab test that is abnormal or we need to change your treatment, we will call you to review the results.   Testing/Procedures: None Ordered   Follow-Up: At [facility], you and your health needs are our priority.  As part of our continuing mission to provide you with exceptional heart care, we have created designated Provider Care Teams.  These Care Teams include your primary Cardiologist (physician) and Advanced Practice Providers (APPs -  Physician Assistants and Nurse Practitioners) who all work together to provide you with the care you need, when you need it.  Your next appointment:   1 year(s)  The format for your next appointment:   In Person  Provider:   You may see Mertie Moores, MD or one of the following Advanced Practice Providers on your designated Care Team:    Richardson Dopp, PA-C  [place], [place]  Daune Perch, [place]       Signed, Mertie Moores, MD  06/23/2019 8:15 PM    [place]

## 2019-07-25 ENCOUNTER — Other Ambulatory Visit: Payer: Self-pay | Admitting: General Surgery

## 2019-07-25 DIAGNOSIS — J9611 Chronic respiratory failure with hypoxia: Secondary | ICD-10-CM

## 2019-07-25 DIAGNOSIS — R0602 Shortness of breath: Secondary | ICD-10-CM

## 2019-07-25 DIAGNOSIS — J432 Centrilobular emphysema: Secondary | ICD-10-CM

## 2019-07-25 DIAGNOSIS — J441 Chronic obstructive pulmonary disease with (acute) exacerbation: Secondary | ICD-10-CM

## 2019-07-25 MED ORDER — TRELEGY ELLIPTA 100-62.5-25 MCG/INH IN AEPB: 1.0000 | INHALATION_SPRAY | Freq: Every day | RESPIRATORY_TRACT | 6 refills | Status: DC

## 2019-08-08 ENCOUNTER — Other Ambulatory Visit: Payer: Self-pay

## 2019-08-08 ENCOUNTER — Emergency Department (HOSPITAL_COMMUNITY)
Admission: EM | Admit: 2019-08-08 | Discharge: 2019-08-08 | Disposition: A | Discharge status: Home / Self Care | Payer: Medicare Other | Attending: Emergency Medicine | Admitting: Emergency Medicine

## 2019-08-08 ENCOUNTER — Encounter (HOSPITAL_COMMUNITY): Payer: Self-pay

## 2019-08-08 ENCOUNTER — Emergency Department (HOSPITAL_COMMUNITY): Payer: Medicare Other

## 2019-08-08 DIAGNOSIS — Z7901 Long term (current) use of anticoagulants: Secondary | ICD-10-CM | POA: Insufficient documentation

## 2019-08-08 DIAGNOSIS — Z87891 Personal history of nicotine dependence: Secondary | ICD-10-CM | POA: Diagnosis not present

## 2019-08-08 DIAGNOSIS — Z79899 Other long term (current) drug therapy: Secondary | ICD-10-CM | POA: Insufficient documentation

## 2019-08-08 DIAGNOSIS — R0789 Other chest pain: Secondary | ICD-10-CM | POA: Diagnosis present

## 2019-08-08 DIAGNOSIS — Z992 Dependence on renal dialysis: Secondary | ICD-10-CM | POA: Insufficient documentation

## 2019-08-08 DIAGNOSIS — Z8616 Personal history of covid-19: Secondary | ICD-10-CM | POA: Diagnosis not present

## 2019-08-08 DIAGNOSIS — I120 Hypertensive chronic kidney disease with stage 5 chronic kidney disease or end stage renal disease: Secondary | ICD-10-CM | POA: Diagnosis not present

## 2019-08-08 DIAGNOSIS — E039 Hypothyroidism, unspecified: Secondary | ICD-10-CM | POA: Diagnosis not present

## 2019-08-08 DIAGNOSIS — N186 End stage renal disease: Secondary | ICD-10-CM | POA: Diagnosis not present

## 2019-08-08 DIAGNOSIS — J441 Chronic obstructive pulmonary disease with (acute) exacerbation: Secondary | ICD-10-CM | POA: Insufficient documentation

## 2019-08-08 LAB — BASIC METABOLIC PANEL
Anion gap: 14 (ref 5–15)
BUN: 17 mg/dL (ref 8–23)
CO2: 23 mmol/L (ref 22–32)
Calcium: 8 mg/dL — ABNORMAL LOW (ref 8.9–10.3)
Chloride: 99 mmol/L (ref 98–111)
Creatinine, Ser: 4.88 mg/dL — ABNORMAL HIGH (ref 0.44–1.00)
GFR calc Af Amer: 10 mL/min — ABNORMAL LOW (ref >60)
GFR calc non Af Amer: 8 mL/min — ABNORMAL LOW (ref >60)
Glucose, Bld: 86 mg/dL (ref 70–99)
Potassium: 3.1 mmol/L — ABNORMAL LOW (ref 3.5–5.1)
Sodium: 136 mmol/L (ref 135–145)

## 2019-08-08 LAB — CBC
HCT: 34.5 % — ABNORMAL LOW (ref 36.0–46.0)
Hemoglobin: 10.2 g/dL — ABNORMAL LOW (ref 12.0–15.0)
MCH: 30 pg (ref 26.0–34.0)
MCHC: 29.6 g/dL — ABNORMAL LOW (ref 30.0–36.0)
MCV: 101.5 fL — ABNORMAL HIGH (ref 80.0–100.0)
Platelets: 89 10*3/uL — ABNORMAL LOW (ref 150–400)
RBC: 3.4 MIL/uL — ABNORMAL LOW (ref 3.87–5.11)
RDW: 14.5 % (ref 11.5–15.5)
WBC: 6 10*3/uL (ref 4.0–10.5)
nRBC: 0 % (ref 0.0–0.2)

## 2019-08-08 LAB — TROPONIN I (HIGH SENSITIVITY): Troponin I (High Sensitivity): 11 ng/L (ref <18)

## 2019-08-08 MED ORDER — METHYLPREDNISOLONE SODIUM SUCC 125 MG IJ SOLR
125.0000 mg | Freq: Once | INTRAMUSCULAR | Status: AC
  Administered 2019-08-08: 125 mg via INTRAVENOUS
  Filled 2019-08-08: qty 2

## 2019-08-08 MED ORDER — IPRATROPIUM-ALBUTEROL 0.5-2.5 (3) MG/3ML IN SOLN
3.0000 mL | Freq: Once | RESPIRATORY_TRACT | Status: AC
  Administered 2019-08-08: 3 mL via RESPIRATORY_TRACT
  Filled 2019-08-08: qty 3

## 2019-08-08 MED ORDER — PREDNISONE 10 MG (21) PO TBPK: ORAL_TABLET | ORAL | 0 refills | Status: DC

## 2019-08-08 NOTE — ED Provider Notes (Signed)
[facility] EMERGENCY DEPARTMENT Provider Note   CSN: [id number] Arrival date & time: 08/08/19  1525     History Chief Complaint  Patient presents with  . Chest Pain  . Nausea    Natasha [name] is a 74 y.o. female.  Pt presents to the ED today with sob.  Pt was at dialysis today when sx started.  Pt said her O2 sats dropped while at dialysis.  She said her dialysis was stopped after 3 hrs due to the sob.  She did have a little cp, but she said the cp was discomfort from not being able to breathe.  No cp now.  Pt had Covid in November and has had 2 doses of the [employer] vaccine.        Past Medical History:  Diagnosis Date  . Anal fistula   . Anemia in chronic kidney disease (CKD)   . Aortic insufficiency    Echo 3/18: mod conc LVH, EF 60-65, no RWMA, Gr 1 DD, mod AI, mild MR, normal RVSF, Trivial TR  . Arthritis   . Borderline hypertension   . Bulging disc    L3-L4  . Chronic diarrhea    due to crohn's  . CKD (chronic kidney disease), stage IV ()    MWF- [address]  . Crohn's disease ([place])    chronic ileitis  . Dyspnea    with exertion  . Emphysema/COPD ([place])   . GERD (gastroesophageal reflux disease)    denies  . History of blood transfusion   . History of glaucoma   . History of kidney stones   . History of pancreatitis    2008--  mercaptopurine  . History of small bowel obstruction    12-03-2010  due to crohn's ileitis  . Hypertension    no longer on medications  . Hypothyroidism, postsurgical    multinodule w/ hurthle cells  . Osteoporosis   . Perianal Crohn's disease ([place])   . Peripheral vascular disease ([place])    blood clot behind knee left leg  . Polyarthralgia   . Seizures ([place])    03/2017  . Wears partial dentures    upper    Patient Active Problem List   Diagnosis Date Noted  . COVID-19 virus infection 04/13/2019  . Acute on chronic respiratory failure with hypoxia ([place]) 04/13/2019  . Chronic anticoagulation  04/13/2019  . Medication management 02/22/2019  . Chronic respiratory failure with hypoxia ([place]) 09/23/2018  . Hx of Pulmonary embolism ([place]) 05/28/2018  . COPD with acute exacerbation ([place]) 03/31/2018  . Acute sinusitis 03/31/2018  . Crohn's disease of colon with complication ([place])   . Aortic insufficiency 07/30/2016  . Atherosclerosis of aorta ([place]) 07/30/2016  . Pressure injury of skin 07/14/2016  . Gastroenteritis, acute 07/12/2016  . COPD (chronic obstructive pulmonary disease) ([place]) 10/12/2015  . Malnutrition of moderate degree ([place]) 09/13/2014  . history of Left leg DVT 09/10/2014  . Hypotension 09/10/2014  . Hypoglycemia 09/10/2014  . Ischiorectal abscess 09/09/2014  . Increased anion gap metabolic acidosis   . Generalized abdominal pain   . Chronic diastolic CHF (congestive heart failure) ([place])   . Hypertensive heart disease   . Secondary hyperparathyroidism ([place])   . Other specified hypothyroidism   . Right shoulder pain   . Thrombocytopenia ([place])   . Severe protein-calorie malnutrition ([place])   . Thyroid activity decreased   . Emphysema of lung ([place])   . Nephrolithiasis 03/29/2014  . Spinal stenosis of lumbar region 11/01/2013  .  Bilateral leg pain 09/28/2013  . Emphysema/COPD ([place])   . Acute dyspnea 09/20/2013  . Protein-calorie malnutrition, severe ([place]) 05/14/2013  . Salmonella enteritidis 06/11/2012  . Hypocalcemia 06/10/2012  . Hypothyroidism 06/10/2012  . Anemia of chronic disease 06/09/2012  . Hypomagnesemia 06/09/2012  . ESRD on dialysis ([place]) 06/09/2012  . Dehydration 06/07/2012  . Crohn's disease without complication ([place]) 82/42/3536  . Metabolic acidosis 14/43/1540  . Hypokalemia 06/07/2012  . Nausea vomiting and diarrhea 06/07/2012  . UTI (lower urinary tract infection) 06/07/2012    Past Surgical History:  Procedure Laterality Date  . ABDOMINAL HYSTERECTOMY  1990   and  Appendectomy  . AV FISTULA PLACEMENT Left 12/08/2017   Procedure:  INSERTION OF ARTERIOVENOUS (AV) GRAFT WITH ARTEGRAFT TO LEFT UPPER ARM;  Surgeon: Conrad Black Rock, MD;  Location: [facility];  Service: Vascular;  Laterality: Left;  . BASCILIC VEIN TRANSPOSITION Left 01/09/2017   Procedure: LEFT 1ST STAGE BRACHIAL VEIN TRANSPOSITION;  Surgeon: Conrad Barstow, MD;  Location: [facility];  Service: Vascular;  Laterality: Left;  . BASCILIC VEIN TRANSPOSITION Left 06/16/2017   Procedure: Second Stage BASILIC VEIN TRANSPOSITION  LEFT ARM;  Surgeon: Conrad Quenemo, MD;  Location: [facility];  Service: Vascular;  Laterality: Left;  . BIOPSY  03/09/2018   Procedure: BIOPSY;  Surgeon: Wilford Corner, MD;  Location: [facility];  Service: Endoscopy;;  . CHOLECYSTECTOMY    . COLON RESECTION  x3 --  1978,  1987, 1989   ILEAL RESECTION x2/   [place]  . COLONOSCOPY WITH PROPOFOL N/A 09/25/2014   Procedure: COLONOSCOPY WITH PROPOFOL;  Surgeon: Garlan Fair, MD;  [facility];  Service: Endoscopy;  Laterality: N/A;  . COLONOSCOPY WITH PROPOFOL N/A 03/09/2018   Procedure: COLONOSCOPY WITH PROPOFOL;  Surgeon: Wilford Corner, MD;  [facility];  Service: Endoscopy;  Laterality: N/A;  . CYSTOSCOPY W/ URETERAL STENT PLACEMENT Right 11/19/2016   Procedure: CYSTOSCOPY WITH RIGHT RETROGRADE PYELOGRAM/ URETEROSCOPY WITH LASER AND RIGHT URETERAL STENT PLACEMENT;  Surgeon: Ardis Hughs, MD;  [facility];  Service: Urology;  Laterality: Right;  . ESOPHAGOGASTRODUODENOSCOPY  03/04/2012   Procedure: ESOPHAGOGASTRODUODENOSCOPY (EGD);  Surgeon: Garlan Fair, MD;  [facility];  Service: Endoscopy;  Laterality: N/A;  . EXAMINATION UNDER ANESTHESIA N/A 09/12/2014   Procedure: EXAM UNDER ANESTHESIA;  Surgeon: Rolm Bookbinder, MD;  Location: [facility];  Service: General;  Laterality: N/A;  . EYE SURGERY     laser  . FLEXIBLE SIGMOIDOSCOPY  03/04/2012   Procedure: FLEXIBLE SIGMOIDOSCOPY;  Surgeon: Garlan Fair, MD;  [facility];  Service:  Endoscopy;  Laterality: N/A;  . GLAUCOMA SURGERY Bilateral   . HOLMIUM LASER APPLICATION Right 0/86/7619   Procedure: HOLMIUM LASER APPLICATION;  Surgeon: Ardis Hughs, MD;  [facility];  Service: Urology;  Laterality: Right;  . INCISION AND DRAINAGE PERIRECTAL ABSCESS N/A 09/12/2014   Procedure: IRRIGATION AND DEBRIDEMENT PERIRECTAL ABSCESS;  Surgeon: Rolm Bookbinder, MD;  Location: [facility];  Service: General;  Laterality: N/A;  . IR FLUORO GUIDE CV LINE RIGHT  03/20/2017  . IR US GUIDE VASC ACCESS RIGHT  03/20/2017  . NEGATIVE SLEEP STUDY  2008  . PLACEMENT OF SETON N/A 12/01/2014   Procedure: PLACEMENT OF SETON;  Surgeon: [name], MD;  Location: [facility];  Service: General;  Laterality: N/A;  . POLYPECTOMY  03/09/2018   Procedure: POLYPECTOMY;  Surgeon: Wilford Corner, MD;  [facility];  Service: Endoscopy;;  . RECTAL EXAM UNDER  ANESTHESIA N/A 12/01/2014   Procedure: RECTAL EXAM UNDER ANESTHESIA;  Surgeon: [name], MD;  Location: [facility];  Service: General;  Laterality: N/A;  . TOTAL THYROIDECTOMY  10-13-2003  . TRANSTHORACIC ECHOCARDIOGRAM  07-11-2013   mild LVH,  ef 55%,  moderate AR,  mild MR and TR,  trivial PR  . TUBAL LIGATION       OB History   No obstetric history on file.     Family History  Problem Relation Age of Onset  . Hypertension Father   . Heart disease Father   . Heart attack Father   . Diverticulitis Mother   . COPD Mother   . Hypertension Mother   . Heart disease Mother   . Heart attack Mother   . Heart attack Brother   . Colon cancer Brother   . Diabetes Brother   . Thyroid disease Brother     Social History   Tobacco Use  . Smoking status: Former Smoker    Packs/day: 1.00    Years: 35.00    Pack years: 35.00    Types: Cigarettes    Start date: 02/22/1967    Quit date: 11/27/2001    Years since quitting: 17.7  . Smokeless tobacco: Never Used  Substance Use Topics  .  Alcohol use: No  . Drug use: No    Home Medications Prior to Admission medications   Medication Sig Start Date End Date Taking? Authorizing Provider  acetaminophen (TYLENOL) 500 MG tablet Take 1,000 mg by mouth every 6 (six) hours as needed for mild pain.     [provider]  Adalimumab (HUMIRA) 40 MG/0.8ML PSKT Inject 40 mg into the skin every 14 (fourteen) days.     [provider]  albuterol (VENTOLIN HFA) 108 (90 Base) MCG/ACT inhaler INHALE 2 PUFFS BY MOUTH EVERY 6 HOURS IF NEEDED FOR WHEEZING OR SHORTNESS OF BREATH 05/31/19   Chesley Mires, MD  alendronate (FOSAMAX) 70 MG tablet Take 70 mg by mouth every Monday.  11/06/16   [provider]  benzonatate (TESSALON) 100 MG capsule Take 100 mg by mouth 3 (three) times daily as needed. 05/11/19   [provider]  calcitRIOL (ROCALTROL) 0.25 MCG capsule Take 0.25 mcg by mouth every Monday, Wednesday, and Friday with hemodialysis. 02/03/17   [provider]  calcium acetate (PHOSLO) 667 MG capsule Take 1 capsule (667 mg total) by mouth 3 (three) times daily with meals. 06/06/18   Domenic Polite, MD  calcium-vitamin D (OSCAL WITH D) 500-200 MG-UNIT TABS tablet Take 1 tablet by mouth as needed.    [provider]  cyanocobalamin (,VITAMIN B-12,) 1000 MCG/ML injection Inject 1,000 mcg into the muscle every 30 (thirty) days.     [provider]  ELIQUIS 5 MG TABS tablet Take 2.5 mg by mouth 2 (two) times daily. Takes 1/2 tablet (2.90m) bid 08/23/18   [provider]  fluticasone (FLONASE) 50 MCG/ACT nasal spray Place 1-2 sprays into both nostrils daily. 03/19/19   [provider]  Fluticasone-Umeclidin-Vilant (TRELEGY ELLIPTA) 100-62.5-25 MCG/INH AEPB Inhale 1 puff into the lungs daily. 07/25/19   SChesley Mires MD  heparin 1000 UNIT/ML injection Inject 1,000 Units into the vein 3 (three) times a week. 12/01/18 11/30/19  [provider]  HYDROcodone-acetaminophen (NORCO)  5-325 MG tablet Take 1 tablet by mouth every 6 (six) hours as needed for moderate pain. 12/08/17   EDagoberto Ligas PA-C  ipratropium-albuterol (DUONEB) 0.5-2.5 (3) MG/3ML SOLN Take 3 mLs by nebulization  every 6 (six) hours as needed. 12/30/18   Fenton Foy, NP  levothyroxine (SYNTHROID, LEVOTHROID) 125 MCG tablet Take 125 mcg by mouth daily before breakfast.     [provider]  lidocaine (XYLOCAINE) 5 % ointment Apply 1 application topically as needed. 01/29/17   [provider]  lidocaine-prilocaine (EMLA) cream Apply 1 application topically 3 (three) times a week.  11/29/18   [provider]  loperamide (IMODIUM) 2 MG capsule Take 2 capsules (4 mg total) by mouth as needed for diarrhea or loose stools. 03/27/17   Caren Griffins, MD  Methoxy PEG-Epoetin Beta (MIRCERA IJ) Inject 150 mg as directed every 14 (fourteen) days. 06/13/19 06/11/20  [provider]  midodrine (PROAMATINE) 5 MG tablet Take 5 mg by mouth every Monday, Wednesday, and Friday. 5 mg PRIOR TO dialysis on Mon/Wed/Fri 04/30/17   [provider]  nitroGLYCERIN (NITROSTAT) 0.4 MG SL tablet Place 1 tablet (0.4 mg total) under the tongue every 5 (five) minutes as needed for chest pain. 05/03/19 08/01/19  Nahser, Wonda Cheng, MD  ondansetron (ZOFRAN ODT) 4 MG disintegrating tablet Take 1 tablet (4 mg total) by mouth every 8 (eight) hours as needed for nausea or vomiting. 03/29/18   Deno Etienne, DO  OXYGEN Inhale 2-3 L into the lungs See admin instructions. Use every night and during the day as needed for shortness of breath    [provider]  predniSONE (DELTASONE) 5 MG tablet Take 5 mg by mouth daily with breakfast.    [provider]  predniSONE (STERAPRED UNI-PAK 21 TAB) 10 MG (21) TBPK tablet Take 6 tabs for 2 days, then 5 for 2 days, then 4 for 2 days, then 3 for 2 days, 2 for 2 days, then 1 for 2 days 08/08/19   Isla Pence, MD  promethazine (PHENERGAN) 25 MG tablet Take 25 mg  by mouth as needed. 01/23/17   [provider]  Respiratory Therapy Supplies (NEBULIZER) DEVI 1 Device by Does not apply route as needed. 12/07/18   Fenton Foy, NP  traMADol (ULTRAM) 50 MG tablet Take 50 mg by mouth as needed. 01/09/17   [provider]  zinc sulfate 220 (50 Zn) MG capsule Take 1 capsule (220 mg total) by mouth daily. 04/17/19   Elodia Florence., MD    Allergies    Mercaptopurine and Remicade [infliximab]  Review of Systems   Review of Systems  Respiratory: Positive for shortness of breath.   All other systems reviewed and are negative.   Physical Exam Updated Vital Signs BP 120/65   Pulse (!) 114   Temp 98.1 F (36.7 C) (Oral)   Resp 15   Wt 51.3 kg   SpO2 100%   BMI 19.40 kg/m   Physical Exam Vitals and nursing note reviewed.  Constitutional:      Appearance: She is well-developed.  HENT:     Head: Normocephalic and atraumatic.  Eyes:     Extraocular Movements: Extraocular movements intact.     Pupils: Pupils are equal, round, and reactive to light.  Cardiovascular:     Rate and Rhythm: Normal rate and regular rhythm.     Heart sounds: Normal heart sounds.  Pulmonary:     Breath sounds: Wheezing present.  Abdominal:     General: Bowel sounds are normal.     Palpations: Abdomen is soft.  Musculoskeletal:        General: Normal range of motion.     Cervical back:  Normal range of motion and neck supple.  Skin:    General: Skin is warm.     Capillary Refill: Capillary refill takes less than 2 seconds.  Neurological:     General: No focal deficit present.     Mental Status: She is alert and oriented to person, place, and time.  Psychiatric:        Mood and Affect: Mood normal.        Behavior: Behavior normal.     ED Results / Procedures / Treatments   Labs (all labs ordered are listed, but only abnormal results are displayed) Labs Reviewed  CBC - Abnormal; Notable for the following components:      Result Value     RBC 3.40 (*)    Hemoglobin 10.2 (*)    HCT 34.5 (*)    MCV 101.5 (*)    MCHC 29.6 (*)    Platelets 89 (*)    All other components within normal limits  BASIC METABOLIC PANEL - Abnormal; Notable for the following components:   Potassium 3.1 (*)    Creatinine, Ser 4.88 (*)    Calcium 8.0 (*)    GFR calc non Af Amer 8 (*)    GFR calc Af Amer 10 (*)    All other components within normal limits  TROPONIN I (HIGH SENSITIVITY)    EKG EKG Interpretation  Date/Time:  Monday August 08 2019 15:41:43 EDT Ventricular Rate:  113 PR Interval:  122 QRS Duration: 70 QT Interval:  368 QTC Calculation: 504 R Axis:   74 Text Interpretation: Sinus tachycardia Anterior infarct , age undetermined Abnormal ECG No significant change since last tracing Confirmed by Isla Pence [id number]) on 08/08/2019 6:36:14 PM   Radiology DG Chest 2 View  Result Date: 08/08/2019 CLINICAL DATA:  Chest pain. Additional history provided: Patient from dialysis via EMS for sudden onset chest pain. EXAM: CHEST - 2 VIEW COMPARISON:  Chest radiograph 04/15/2011, chest CT 04/14/2019 FINDINGS: Heart size within normal limits. Aortic atherosclerosis. Redemonstrated chronically prominent interstitial lung markings. No evidence of airspace consolidation within the lungs. Trace bilateral pleural effusions versus pleural thickening, unchanged. No evidence of pneumothorax. A vascular stent projects in the region of the left axilla. Surgical clips project in the region of the lower neck. No acute bony abnormality. IMPRESSION: No evidence of acute cardiopulmonary abnormality. Chronic prominence of the interstitial lung markings. Emphysema was demonstrated on prior chest CT 04/14/2019. Trace bilateral pleural effusions versus chronic pleural thickening, unchanged. Aortic atherosclerosis. Electronically Signed   By: Kellie Simmering DO   On: 08/08/2019 16:25    Procedures Procedures (including critical care time)  Medications Ordered in  ED Medications  methylPREDNISolone sodium succinate (SOLU-MEDROL) 125 mg/2 mL injection 125 mg (125 mg Intravenous Given 08/08/19 1903)  ipratropium-albuterol (DUONEB) 0.5-2.5 (3) MG/3ML nebulizer solution 3 mL (3 mLs Nebulization Given 08/08/19 1903)    ED Course  I have reviewed the triage vital signs and the nursing notes.  Pertinent labs & imaging results that were available during my care of the patient were reviewed by me and considered in my medical decision making (see chart for details).    MDM Rules/Calculators/A&P                     Pt is feeling much better after neb and solumedrol.  Pt ambulates with O2 sats in the mid 90s on her usual 3L oxygen via [place].  Pt is stable for d/c.  Return if worse.  F/u with pcp. Final Clinical Impression(s) / ED Diagnoses Final diagnoses:  COPD exacerbation ([place])  ESRD on hemodialysis ([place])    Rx / DC Orders ED Discharge Orders         Ordered    predniSONE (STERAPRED UNI-PAK 21 TAB) 10 MG (21) TBPK tablet     08/08/19 2039           Isla Pence, MD 08/08/19 2044

## 2019-08-08 NOTE — ED Triage Notes (Signed)
Pt from dialysis with ems for sudden onset chest pain 2 hrs and 25 mins into treatment. Pt also c.o nausea and feels like she needs to have a BM. Pt given a total of 4 Nitros. Always on 3L [place]. Pt arrives to ED alert, oriented. VSS.

## 2019-08-08 NOTE — Discharge Instructions (Signed)
Hold prednisone 5 mg while on high dose taper.  Then go back to 5 mg prednisone when done with your taper.

## 2019-08-08 NOTE — ED Notes (Signed)
No answer x 3 in lobby and outside.

## 2019-08-08 NOTE — ED Notes (Signed)
Patient ambulated with no gait assistance needed, SPo2 remained between 97-100% with 3L o2 via [place]. Patient denies discomfort when asked while ambulating,

## 2019-09-13 ENCOUNTER — Other Ambulatory Visit: Payer: Self-pay | Admitting: Gastroenterology

## 2019-09-13 ENCOUNTER — Other Ambulatory Visit (HOSPITAL_COMMUNITY): Payer: Self-pay | Admitting: Gastroenterology

## 2019-09-13 DIAGNOSIS — R1084 Generalized abdominal pain: Secondary | ICD-10-CM

## 2019-09-13 DIAGNOSIS — K50813 Crohn's disease of both small and large intestine with fistula: Secondary | ICD-10-CM

## 2019-09-20 ENCOUNTER — Encounter (HOSPITAL_COMMUNITY): Payer: Self-pay

## 2019-09-20 ENCOUNTER — Ambulatory Visit (HOSPITAL_COMMUNITY)
Admission: RE | Admit: 2019-09-20 | Discharge: 2019-09-20 | Disposition: A | Discharge status: Home / Self Care | Payer: Medicare Other | Source: Ambulatory Visit | Attending: Gastroenterology | Admitting: Gastroenterology

## 2019-09-20 ENCOUNTER — Other Ambulatory Visit: Payer: Self-pay

## 2019-09-20 DIAGNOSIS — R1084 Generalized abdominal pain: Secondary | ICD-10-CM | POA: Diagnosis present

## 2019-09-20 DIAGNOSIS — K50813 Crohn's disease of both small and large intestine with fistula: Secondary | ICD-10-CM | POA: Diagnosis present

## 2019-09-20 MED ORDER — SODIUM CHLORIDE (PF) 0.9 % IJ SOLN
INTRAMUSCULAR | Status: AC
  Filled 2019-09-20: qty 50

## 2019-09-20 MED ORDER — IOHEXOL 300 MG/ML  SOLN
100.0000 mL | Freq: Once | INTRAMUSCULAR | Status: AC | PRN
  Administered 2019-09-20: 75 mL via INTRAVENOUS

## 2019-09-27 ENCOUNTER — Other Ambulatory Visit: Payer: Self-pay

## 2019-09-27 ENCOUNTER — Ambulatory Visit (INDEPENDENT_AMBULATORY_CARE_PROVIDER_SITE_OTHER): Payer: Medicare Other | Admitting: Pulmonary Disease

## 2019-09-27 ENCOUNTER — Encounter: Payer: Self-pay | Admitting: Pulmonary Disease

## 2019-09-27 DIAGNOSIS — R0602 Shortness of breath: Secondary | ICD-10-CM

## 2019-09-27 DIAGNOSIS — Z992 Dependence on renal dialysis: Secondary | ICD-10-CM

## 2019-09-27 DIAGNOSIS — J189 Pneumonia, unspecified organism: Secondary | ICD-10-CM | POA: Insufficient documentation

## 2019-09-27 DIAGNOSIS — J441 Chronic obstructive pulmonary disease with (acute) exacerbation: Secondary | ICD-10-CM

## 2019-09-27 DIAGNOSIS — N186 End stage renal disease: Secondary | ICD-10-CM

## 2019-09-27 DIAGNOSIS — K5090 Crohn's disease, unspecified, without complications: Secondary | ICD-10-CM

## 2019-09-27 DIAGNOSIS — Z8616 Personal history of COVID-19: Secondary | ICD-10-CM

## 2019-09-27 MED ORDER — PREDNISONE 10 MG PO TABS: ORAL_TABLET | ORAL | 0 refills | Status: DC

## 2019-09-27 MED ORDER — AMOXICILLIN-POT CLAVULANATE 875-125 MG PO TABS: 1.0000 | ORAL_TABLET | Freq: Two times a day (BID) | ORAL | 0 refills | Status: DC

## 2019-09-27 NOTE — Assessment & Plan Note (Signed)
Plan: Keep follow-up with gastroenterology

## 2019-09-27 NOTE — Assessment & Plan Note (Signed)
History of Covid pneumonia  Plan: We will continue to clinically follow

## 2019-09-27 NOTE — Progress Notes (Signed)
Virtual Visit via Telephone Note  I connected with Natasha [name] on 09/27/19 at  4:00 PM EDT by telephone and verified that I am speaking with the correct person using two identifiers.  Location: Patient: Home Provider: [facility] - [address], [place], [place], [place] [address]   I discussed the limitations, risks, security and privacy concerns of performing an evaluation and management service by telephone and the availability of in person appointments. I also discussed with the patient that there may be a patient responsible charge related to this service. The patient expressed understanding and agreed to proceed.  Patient consented to consult via telephone: Yes People present and their role in pt care: Pt   History of Present Illness:  74 year old female followed in our office for COPD  Past medical history: Crohn's, chronic diastolic heart failure, history of PE, history of COVID-19 Smoking history: Former smoker.  Quit 2003.  35-pack-year smoking history. Maintenance: Trelegy Ellipta, pred 46m  Patient of Dr. SHalford Chessman Chief complaint: Recent abdominal CT showing potential pneumonia, seen in emergency room in [id number] 74year old female former smoker followed in our office for COPD.  Patient last treated acutely for COPD exacerbation in March/2021 by the emergency room with a prednisone taper.  Patient reports she is been maintained on Trelegy Ellipta.  Patient reports that she has had increased shortness of breath as well due to episodes of wheezing that has worsened over the last 3 weeks.  She thought initially that she was having a flare of her Crohn's.  She followed up with Dr. SMichail Sermonwith the GI.  A CT abdomen was performed.  CT abdomen shows lower lung fields potentially with bronchopneumonia in right lower lobe for aspiration pneumonitis.  Patient reports that 3 weeks ago she was having intermittent low fevers T-max was 99.9.  She has felt that she has  had increased shortness of breath.  She reports that she has been completing all of her HD appointments.  They are currently pulling fluid off.  Weights are stable.  Patient reports adherence to Trelegy Ellipta.  Patient contacted our office at the request of her gastroenterologist.  Patient has an upcoming follow-up with Dr. SHalford Chessmanon May/18/2021.  Observations/Objective:  Pulmonary tests:  PFT 08/08/13 >> FEV1 1.00 (52%), FEV1% 50, TLC 3.97 (78%), DLCO 20% Ambulatory oximetry with RA 04/27/18 >> SpO2 80% PFT 03/29/19 >> FEV1 0.54 (33%), FEV1% 36, DLCO 29%, no BD  Chest imaging:  CT chest 04/17/16 >> severe centrilobular emphysema CT angio chest 05/28/18 >> PE LUL, extensive emphysema, b/l nephrolithiasis CT angio 10/13/18 >> centrilobular and paraseptal emphysema  Sleep tests:  HST 06/26/17 >> AHI 0.5, SaO2 low 86%.  Social History   Tobacco Use  Smoking Status Former Smoker  . Packs/day: 1.00  . Years: 35.00  . Pack years: 35.00  . Types: Cigarettes  . Start date: 02/22/1967  . Quit date: 11/27/2001  . Years since quitting: 17.8  Smokeless Tobacco Never Used   Immunization History  Administered Date(s) Administered  . Influenza Split 02/28/2016, 02/23/2017  . Influenza, High Dose Seasonal PF 02/16/2019  . Pneumococcal Conjugate-13 03/26/2017      Assessment and Plan:  COPD (chronic obstructive pulmonary disease) (HCC) Plan: Continue Trelegy Ellipta Augmentin today Keep close follow-up with our office  Crohn's disease without complication ([place] Plan: Keep follow-up with gastroenterology  ESRD on dialysis ([facility] Plan: Continue hemodialysis  Pneumonia of right lower lobe due to infectious organism Right lower lobe pneumonia  seen on recent CT abdomen Patient endorsing increased shortness of breath as well as intermittent fevers Increased cough and congestion Intermittent  Patient denies trouble swallowing or increased cough or congestion after  eating   Plan: Augmentin for 10 days If pneumonia or symptoms persist despite antibiotics consider sputum culture as well as swallow study Close follow-up for an office evaluation on 10/11/2019 Repeat chest x-ray in 8 weeks   History of COVID-19 History of Covid pneumonia  Plan: We will continue to clinically follow   Follow Up Instructions:  Return in about 2 weeks (around 10/11/2019), or if symptoms worsen or fail to improve, for Follow up with Dr. Halford Chessman.   I discussed the assessment and treatment plan with the patient. The patient was provided an opportunity to ask questions and all were answered. The patient agreed with the plan and demonstrated an understanding of the instructions.   The patient was advised to call back or seek an in-person evaluation if the symptoms worsen or if the condition fails to improve as anticipated.  I provided 25 minutes of non-face-to-face time during this encounter.   [name], NP

## 2019-09-27 NOTE — Patient Instructions (Addendum)
You were seen today by [name], NP  for:  1. Shortness of breath 2. Pneumonia of right lower lobe due to infectious organism  - amoxicillin-clavulanate (AUGMENTIN) 875-125 MG tablet; Take 1 tablet by mouth 2 (two) times daily.  Dispense: 20 tablet; Refill: 0  If your symptoms are not improving with taking these antibiotics please let us know.  We may need to consider a sputum sample or consider repeat chest imaging.  3. Chronic obstructive pulmonary disease with acute exacerbation (HCC)  - predniSONE (DELTASONE) 10 MG tablet; Take 2 tablets (57m total) daily for the next 5 days. Take in the AM with food.  Dispense: 10 tablet; Refill: 0  Trelegy Ellipta  >>> 1 puff daily in the morning >>>rinse mouth out after use  >>> This inhaler contains 3 medications that help manage her respiratory status, contact our office if you cannot afford this medication or unable to remain on this medication  Note your daily symptoms > remember "red flags" for COPD:   >>>Increase in cough >>>increase in sputum production >>>increase in shortness of breath or activity  intolerance.   If you notice these symptoms, please call the office to be seen.    4. Crohn's disease without complication, unspecified gastrointestinal tract location ([facility]  Continue follow-up with GI  5. ESRD on dialysis ([facility]  Continue hemodialysis  6. History of COVID-19  We will continue to clinically monitor  I am glad that you received your Covid vaccines   We recommend today:   Meds ordered this encounter  Medications  . amoxicillin-clavulanate (AUGMENTIN) 875-125 MG tablet    Sig: Take 1 tablet by mouth 2 (two) times daily.    Dispense:  20 tablet    Refill:  0  . predniSONE (DELTASONE) 10 MG tablet    Sig: Take 2 tablets (239mtotal) daily for the next 5 days. Take in the AM with food.    Dispense:  10 tablet    Refill:  0    Follow Up:    Return in about 2 weeks (around 10/11/2019), or if symptoms  worsen or fail to improve, for Follow up with Dr. SoHalford Chessman  Please do your part to reduce the spread of COVID-19:      Reduce your risk of any infection  and COVID19 by using the similar precautions used for avoiding the common cold or flu:  . Marland Kitchenash your hands often with soap and warm water for at least 20 seconds.  If soap and water are not readily available, use an alcohol-based hand sanitizer with at least 60% alcohol.  . If coughing or sneezing, cover your mouth and nose by coughing or sneezing into the elbow areas of your shirt or coat, into a tissue or into your sleeve (not your hands). . Langley Gauss MASK when in public  . Avoid shaking hands with others and consider head nods or verbal greetings only. . Avoid touching your eyes, nose, or mouth with unwashed hands.  . Avoid close contact with people who are sick. . Avoid places or events with large numbers of people in one location, like concerts or sporting events. . If you have some symptoms but not all symptoms, continue to monitor at home and seek medical attention if your symptoms worsen. . If you are having a medical emergency, call 911.   [facility] e-Visit: [email or web address]       [facility]: [phone]  [facility]: [phone]                   [facility]: [phone]     It is flu season:   >>> Best ways to protect herself from the flu: Receive the yearly flu vaccine, practice good hand hygiene washing with soap and also using hand sanitizer when available, eat a nutritious meals, get adequate rest, hydrate appropriately   Please contact the office if your symptoms worsen or you have concerns that you are not improving.   Thank you for choosing [facility] for your healthcare, and for allowing [place] to partner with you on your healthcare journey. I am thankful to  be able to provide care to you today.   [name]

## 2019-09-27 NOTE — Assessment & Plan Note (Signed)
Plan: Continue Trelegy Ellipta Augmentin today Keep close follow-up with our office

## 2019-09-27 NOTE — Assessment & Plan Note (Signed)
Plan: Continue hemodialysis

## 2019-09-27 NOTE — Assessment & Plan Note (Signed)
Right lower lobe pneumonia seen on recent CT abdomen Patient endorsing increased shortness of breath as well as intermittent fevers Increased cough and congestion Intermittent  Patient denies trouble swallowing or increased cough or congestion after eating   Plan: Augmentin for 10 days If pneumonia or symptoms persist despite antibiotics consider sputum culture as well as swallow study Close follow-up for an office evaluation on 10/11/2019 Repeat chest x-ray in 8 weeks

## 2019-09-28 NOTE — Progress Notes (Signed)
Reviewed and agree with assessment/plan.   Stace Peace, MD [facility] 05/21/2016, 12:24 PM Pager:  [phone]  

## 2019-09-29 ENCOUNTER — Other Ambulatory Visit: Payer: Medicare Other

## 2019-10-11 ENCOUNTER — Ambulatory Visit (INDEPENDENT_AMBULATORY_CARE_PROVIDER_SITE_OTHER): Payer: Medicare Other

## 2019-10-11 ENCOUNTER — Encounter: Payer: Self-pay | Admitting: Pulmonary Disease

## 2019-10-11 ENCOUNTER — Telehealth: Payer: Self-pay | Admitting: Pulmonary Disease

## 2019-10-11 ENCOUNTER — Ambulatory Visit (INDEPENDENT_AMBULATORY_CARE_PROVIDER_SITE_OTHER): Payer: Medicare Other | Admitting: Pulmonary Disease

## 2019-10-11 ENCOUNTER — Other Ambulatory Visit: Payer: Self-pay

## 2019-10-11 VITALS — BP 126/84 | HR 92 | Temp 98.2°F | Ht 64.0 in | Wt 113.0 lb

## 2019-10-11 DIAGNOSIS — J441 Chronic obstructive pulmonary disease with (acute) exacerbation: Secondary | ICD-10-CM | POA: Diagnosis not present

## 2019-10-11 DIAGNOSIS — J189 Pneumonia, unspecified organism: Secondary | ICD-10-CM | POA: Diagnosis not present

## 2019-10-11 DIAGNOSIS — J9611 Chronic respiratory failure with hypoxia: Secondary | ICD-10-CM | POA: Diagnosis not present

## 2019-10-11 DIAGNOSIS — R091 Pleurisy: Secondary | ICD-10-CM

## 2019-10-11 DIAGNOSIS — R918 Other nonspecific abnormal finding of lung field: Secondary | ICD-10-CM

## 2019-10-11 NOTE — Telephone Encounter (Signed)
Spoke with patient regarding prior message. Patient stated she couldn't remember the names of the result's of the CXR. Advised patient she had several nodules.Patient's voice was understanding. Nothing else further needed.

## 2019-10-11 NOTE — Telephone Encounter (Signed)
Called and spoke with pt letting her know about the results of the cxr and that Dr. Halford Chessman said for her to get CT performed. Pt verbalized understanding. Order for CT has been placed. nothing further needed.

## 2019-10-11 NOTE — Patient Instructions (Signed)
Chest xray today  Follow up in 4 months

## 2019-10-11 NOTE — Progress Notes (Signed)
[facility]  Chief Complaint  Patient presents with  . Follow-up    4 month f/u for emphysema. States she is still recovering from PNA. Is still having abdominal pain that wraps around to her back.     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 female former smoker with COPD/Emphysema.  Subjective:   She was treated for pneumonia in April.  She completed ABx.  Was on higher dose of prednisone also.  Once she dropped to normal prednisone dose for Crohn's her lower chest pain got worse.  This hurts when she moves.  Not having cough, sputum, fever, hemoptysis, or wheeze.  She has been waking up during the night feeling short of breath.  She sits on side of her bed for about 30 minutes and then slowly recovers.  She has been using trelegy in the morning.  She has been getting intermittent episodes of nausea.  She was prescribed anti-emetic by GI, and this helps.  Completed COVID vaccines in February.  Physical Exam:   Appearance - wearing oxygen  ENMT - no sinus tenderness, no oral exudate, no LAN, Mallampati 2 airway, no stridor  Respiratory - decreased breath sounds, prolonged exhalation, no wheezing or rales, no tenderness on palpation, no dullness with precussion  CV - s1s2 regular rate and rhythm, no murmurs  Ext - no clubbing, no edema  Skin - no rashes  Psych - normal mood and affect  Assessment/Plan:   Community acquired pneumonia in Rt lower lobe associated with pleurisy. - chest xray today - if this is unrevealing, then give her additional course  of higher dose prednisone for pleuritis  Severe COPD with emphysema. - advised her to try using trelegy later in the evening to see if this prevents her episodes of dyspnea at night - continue prn albuterol  Chronic respiratory failure with hypoxia. - 3 liters oxygen 24/7  Crohn's colitis. - on 5 mg prednisone daily and humira - advised her to contact Dr. Kathline Magic office if her nausea persists  Hx of PE. - eliquis per PCP  ESRD. - followed by [facility]  A total of  32 minutes spent addressing patient care issues on day of visit.   Follow up:   Patient Instructions  Chest xray today  Follow up in 4 months    Signature:  Chesley Mires, MD [place] Pager: [phone] 10/11/2019, 9:29 AM  Flow Sheet     Pulmonary tests:  PFT 08/08/13 >> FEV1 1.00 (52%), FEV1% 50, TLC 3.97 (78%), DLCO 20% Ambulatory oximetry with RA 04/27/18 >> SpO2 80% PFT 03/29/19 >> FEV1 0.54 (33%), FEV1% 36, DLCO 29%, no BD  Chest imaging:  CT chest 04/17/16 >> severe centrilobular emphysema CT angio chest 05/28/18 >> PE LUL, extensive emphysema, b/l nephrolithiasis CT angio 10/13/18 >> centrilobular and paraseptal emphysema  Sleep tests:  HST 06/26/17 >> AHI 0.5, SaO2 low 86%.  Cardiac tests:  Doppler b/l legs 04/06/14 >> DVT Lt femoral, popliteal, posterior tibial, peroneal veins Doppler Lt leg 09/11/14 >> chronic DVT Lt proximal  and mid femoral veins, Lt popliteal veins Echo 10/29/18 >> EF greater than 65%, RVSP 44.3 mmHg  Medications:   Allergies as of 10/11/2019      Reactions   Mercaptopurine Other (See Comments)   Caused pancreatitis   Remicade [infliximab] Other (See Comments)   CAUSED JOINT PAIN      Medication List       Accurate as of Oct 11, 2019  9:29 AM. If you have any questions, ask your nurse or doctor.        STOP taking these medications   amoxicillin-clavulanate 875-125 MG tablet Commonly known as: Augmentin Stopped by:  Chesley Mires, MD     TAKE these medications   acetaminophen 500 MG tablet Commonly known as: TYLENOL Take 1,000 mg by mouth every 6 (six) hours as needed for mild pain.   albuterol 108 (90 Base) MCG/ACT inhaler Commonly known as: VENTOLIN HFA INHALE 2 PUFFS BY MOUTH EVERY 6 HOURS IF NEEDED FOR WHEEZING OR SHORTNESS OF BREATH   alendronate 70 MG tablet Commonly known as: FOSAMAX Take 70 mg by mouth every Monday.   benzonatate 100 MG capsule Commonly known as: TESSALON Take 100 mg by mouth 3 (three) times daily as needed.   calcitRIOL 0.25 MCG capsule Commonly known as: ROCALTROL Take 0.25 mcg by mouth every Monday, Wednesday, and Friday with hemodialysis.   calcium acetate 667 MG capsule Commonly known as: PHOSLO Take 1 capsule (667 mg total) by mouth 3 (three) times daily with meals.   calcium-vitamin D 500-200 MG-UNIT Tabs tablet Commonly known as: OSCAL WITH D Take 1 tablet by mouth as needed.   cyanocobalamin 1000 MCG/ML injection Commonly known as: (VITAMIN B-12) Inject 1,000 mcg into the muscle every 30 (thirty) days.   Eliquis 5 MG Tabs tablet Generic drug: apixaban Take 2.5 mg by mouth 2 (two) times daily. Takes 1/2 tablet (2.28m) bid   fluticasone 50 MCG/ACT nasal spray Commonly known as: FLONASE Place 1-2 sprays into both nostrils daily.   heparin sodium (porcine) 1000 UNIT/ML injection Inject 1,000 Units into the vein 3 (three) times a week.   Humira 40 MG/0.8ML Pskt Generic drug: Adalimumab Inject 40 mg into the skin every 14 (fourteen) days.   HYDROcodone-acetaminophen 5-325 MG tablet Commonly known as: Norco Take 1 tablet by mouth every 6 (six) hours as needed for moderate pain.   ipratropium-albuterol 0.5-2.5 (3) MG/3ML Soln Commonly known as: DUONEB Take 3 mLs by nebulization every 6 (six) hours as needed.   levothyroxine 125 MCG tablet Commonly known as: SYNTHROID Take 125 mcg by mouth daily before breakfast.   lidocaine 5 % ointment  Commonly known as: XYLOCAINE Apply 1 application topically as needed.   lidocaine-prilocaine cream Commonly known as: EMLA Apply 1 application topically 3 (three) times a week.   loperamide 2 MG capsule Commonly known as: IMODIUM Take 2 capsules (4 mg total) by mouth as needed for diarrhea or loose stools.   midodrine 5 MG tablet Commonly known as: PROAMATINE Take 5 mg by mouth every Monday, Wednesday, and Friday. 5 mg PRIOR TO dialysis on Mon/Wed/Fri   MIRCERA IJ Inject 150 mg as directed every 14 (fourteen) days.   Nebulizer Devi 1 Device by Does not apply route as needed.   nitroGLYCERIN 0.4 MG SL tablet Commonly known as: NITROSTAT Place 1 tablet (0.4 mg total) under the tongue every 5 (five) minutes as needed for chest pain.   ondansetron 4 MG disintegrating tablet Commonly known as: Zofran ODT Take 1 tablet (4  mg total) by mouth every 8 (eight) hours as needed for nausea or vomiting.   OXYGEN Inhale 2-3 L into the lungs See admin instructions. Use every night and during the day as needed for shortness of breath   predniSONE 5 MG tablet Commonly known as: DELTASONE Take 5 mg by mouth daily with breakfast. What changed: Another medication with the same name was removed. Continue taking this medication, and follow the directions you see here. Changed by: Chesley Mires, MD   promethazine 25 MG tablet Commonly known as: PHENERGAN Take 25 mg by mouth as needed.   traMADol 50 MG tablet Commonly known as: ULTRAM Take 50 mg by mouth as needed.   Trelegy Ellipta 100-62.5-25 MCG/INH Aepb Generic drug: Fluticasone-Umeclidin-Vilant Inhale 1 puff into the lungs daily.   zinc sulfate 220 (50 Zn) MG capsule Take 1 capsule (220 mg total) by mouth daily.       Past Surgical History:  She  has a past surgical history that includes Colon resection (x3 --  1978,  1987, 1989); Esophagogastroduodenoscopy (03/04/2012); Flexible sigmoidoscopy (03/04/2012); Examination under  anesthesia (N/A, 09/12/2014); Incision and drainage perirectal abscess (N/A, 09/12/2014); Colonoscopy with propofol (N/A, 09/25/2014); Total thyroidectomy (10-13-2003); transthoracic echocardiogram (07-11-2013); Glaucoma surgery (Bilateral); NEGATIVE SLEEP STUDY (2008); Abdominal hysterectomy (1990); Rectal exam under anesthesia (N/A, 12/01/2014); Placement of seton (N/A, 12/01/2014); Cystoscopy w/ ureteral stent placement (Right, 11/19/2016); Holmium laser application (Right, 0/96/4383); Bascilic vein transposition (Left, 01/09/2017); IR Fluoro Guide CV Line Right (03/20/2017); IR US Guide Vasc Access Right (03/20/2017); Bascilic vein transposition (Left, 06/16/2017); Cholecystectomy; Eye surgery; Tubal ligation; AV fistula placement (Left, 12/08/2017); Colonoscopy with propofol (N/A, 03/09/2018); biopsy (03/09/2018); and polypectomy (03/09/2018).  Family History:  Her family history includes COPD in her mother; Colon cancer in her brother; Diabetes in her brother; Diverticulitis in her mother; Heart attack in her brother, father, and mother; Heart disease in her father and mother; Hypertension in her father and mother; Thyroid disease in her brother.  Social History:  She  reports that she quit smoking about 17 years ago. Her smoking use included cigarettes. She started smoking about 52 years ago. She has a 35.00 pack-year smoking history. She has never used smokeless tobacco. She reports that she does not drink alcohol or use drugs.

## 2019-10-11 NOTE — Telephone Encounter (Signed)
DG Chest 2 View  Result Date: 10/11/2019 CLINICAL DATA:  Chest pain EXAM: CHEST - 2 VIEW COMPARISON:  August 08, 2019 chest radiograph. CT abdomen and pelvis including lung bases September 20, 2019 FINDINGS: There nodular opacity in the left perihilar and left upper lobe regions, not present on chest radiograph from 2 months prior. Largest of these nodular opacities measures 1.5 x 1.2 cm. In the area of apparent nodular opacity seen in the right base on more recent CT, there is subtle opacity but no well-defined nodular appearance in the right base currently. Lungs elsewhere are clear. The heart size and pulmonary vascularity are normal. No adenopathy. Bones are osteoporotic. There is postoperative change in the thyroid region. There is a stent in the left axillary and brachial regions. IMPRESSION: Nodular opacities in the left upper lobe and left perihilar regions. Less nodular opacity evident in the right base compared to recent CT abdomen which included the lung bases. Given the rapid change from 2 months prior, question multifocal pneumonia or possible septic emboli. No cavitation noted. Neoplastic etiology for these nodular opacities is possible. This circumstance may warrant correlation with complete chest CT at this time, ideally with intravenous contrast. Cardiac silhouette within normal limits. No adenopathy appreciable by radiography. Bones osteoporotic. These results will be called to the ordering clinician or representative by the Radiologist Assistant, and communication documented in the PACS or [employer]. Electronically Signed   By: Lowella Grip III M.D.   On: 10/11/2019 10:08      Please let her know that her chest xray shows several nodules.  She needs to get a CT chest w/o contrast (renal patient and can't get IV contrast).

## 2019-10-11 NOTE — Telephone Encounter (Signed)
Received call report from [facility] with [facility] on patient's 2 view chest xray done on 10/11/19. VS please review the result/impression copied below:  IMPRESSION: Nodular opacities in the left upper lobe and left perihilar regions. Less nodular opacity evident in the right base compared to recent CT abdomen which included the lung bases. Given the rapid change from 2 months prior, question multifocal pneumonia or possible septic emboli. No cavitation noted. Neoplastic etiology for these nodular opacities is possible. This circumstance may warrant correlation with complete chest CT at this time, ideally with intravenous contrast.  Cardiac silhouette within normal limits. No adenopathy appreciable by radiography. Bones osteoporotic.  These results will be called to the ordering clinician or representative by the Radiologist Assistant, and communication documented in the PACS or [employer].  Please advise on changes from recent previous CT, thank you.

## 2019-10-12 ENCOUNTER — Telehealth: Payer: Self-pay | Admitting: Pulmonary Disease

## 2019-10-12 NOTE — Telephone Encounter (Signed)
Dr. Halford Chessman, from the notes you were going to wait until the xray came back to determine if prednisone was needed. I called pt to let her know that I would ask VS and call her back. Please advise.    IMPRESSION: Nodular opacities in the left upper lobe and left perihilar regions. Less nodular opacity evident in the right base compared to recent CT abdomen which included the lung bases. Given the rapid change from 2 months prior, question multifocal pneumonia or possible septic emboli. No cavitation noted. Neoplastic etiology for these nodular opacities is possible. This circumstance may warrant correlation with complete chest CT at this time, ideally with intravenous contrast.  Cardiac silhouette within normal limits. No adenopathy appreciable by radiography. Bones osteoporotic.  These results will be called to the ordering clinician or representative by the Radiologist Assistant, and communication documented in the PACS or [employer].

## 2019-10-13 NOTE — Telephone Encounter (Signed)
Please see phone note from 10/11/19.  She needs to have CT chest done first before determining if she needs additional prednisone.  This has been schedule for 10/18/19.  Can you check with [facility] if this can be scheduled sooner.

## 2019-10-13 NOTE — Telephone Encounter (Signed)
LMTCB x 1 

## 2019-10-14 NOTE — Telephone Encounter (Signed)
LMTCB x2  

## 2019-10-17 NOTE — Telephone Encounter (Signed)
ATC patient X3 LMTCB. Per DPR left detailed message stating that she has an appointment for CT tomorrow at 2:30 and for her to please keep this appointment because Dr. Halford Chessman is waiting to get results before making any further recommendations. Per protocol I will close this encounter.

## 2019-10-18 ENCOUNTER — Other Ambulatory Visit: Payer: Self-pay

## 2019-10-18 ENCOUNTER — Encounter (HOSPITAL_COMMUNITY): Payer: Self-pay | Admitting: Emergency Medicine

## 2019-10-18 ENCOUNTER — Telehealth: Payer: Self-pay | Admitting: Pulmonary Disease

## 2019-10-18 ENCOUNTER — Emergency Department (HOSPITAL_COMMUNITY)
Admission: EM | Admit: 2019-10-18 | Discharge: 2019-10-18 | Disposition: A | Discharge status: Home / Self Care | Payer: Medicare Other | Attending: Emergency Medicine | Admitting: Emergency Medicine

## 2019-10-18 ENCOUNTER — Ambulatory Visit (HOSPITAL_COMMUNITY)
Admission: RE | Admit: 2019-10-18 | Discharge: 2019-10-18 | Disposition: A | Discharge status: Home / Self Care | Payer: Medicare Other | Source: Ambulatory Visit | Attending: Pulmonary Disease | Admitting: Pulmonary Disease

## 2019-10-18 DIAGNOSIS — Z79899 Other long term (current) drug therapy: Secondary | ICD-10-CM | POA: Diagnosis not present

## 2019-10-18 DIAGNOSIS — I471 Supraventricular tachycardia: Secondary | ICD-10-CM | POA: Insufficient documentation

## 2019-10-18 DIAGNOSIS — Z7952 Long term (current) use of systemic steroids: Secondary | ICD-10-CM | POA: Insufficient documentation

## 2019-10-18 DIAGNOSIS — Z7901 Long term (current) use of anticoagulants: Secondary | ICD-10-CM | POA: Insufficient documentation

## 2019-10-18 DIAGNOSIS — Z992 Dependence on renal dialysis: Secondary | ICD-10-CM | POA: Insufficient documentation

## 2019-10-18 DIAGNOSIS — Z8616 Personal history of COVID-19: Secondary | ICD-10-CM | POA: Insufficient documentation

## 2019-10-18 DIAGNOSIS — Z86718 Personal history of other venous thrombosis and embolism: Secondary | ICD-10-CM | POA: Diagnosis not present

## 2019-10-18 DIAGNOSIS — J449 Chronic obstructive pulmonary disease, unspecified: Secondary | ICD-10-CM | POA: Insufficient documentation

## 2019-10-18 DIAGNOSIS — I120 Hypertensive chronic kidney disease with stage 5 chronic kidney disease or end stage renal disease: Secondary | ICD-10-CM | POA: Diagnosis not present

## 2019-10-18 DIAGNOSIS — N186 End stage renal disease: Secondary | ICD-10-CM | POA: Insufficient documentation

## 2019-10-18 DIAGNOSIS — R918 Other nonspecific abnormal finding of lung field: Secondary | ICD-10-CM

## 2019-10-18 DIAGNOSIS — R911 Solitary pulmonary nodule: Secondary | ICD-10-CM

## 2019-10-18 DIAGNOSIS — R0789 Other chest pain: Secondary | ICD-10-CM | POA: Diagnosis present

## 2019-10-18 DIAGNOSIS — R0602 Shortness of breath: Secondary | ICD-10-CM | POA: Insufficient documentation

## 2019-10-18 LAB — I-STAT CHEM 8, ED
BUN: 25 mg/dL — ABNORMAL HIGH (ref 8–23)
Calcium, Ion: 1.03 mmol/L — ABNORMAL LOW (ref 1.15–1.40)
Chloride: 100 mmol/L (ref 98–111)
Creatinine, Ser: 7.7 mg/dL — ABNORMAL HIGH (ref 0.44–1.00)
Glucose, Bld: 134 mg/dL — ABNORMAL HIGH (ref 70–99)
HCT: 39 % (ref 36.0–46.0)
Hemoglobin: 13.3 g/dL (ref 12.0–15.0)
Potassium: 3.1 mmol/L — ABNORMAL LOW (ref 3.5–5.1)
Sodium: 140 mmol/L (ref 135–145)
TCO2: 31 mmol/L (ref 22–32)

## 2019-10-18 LAB — CBC WITH DIFFERENTIAL/PLATELET
Abs Immature Granulocytes: 0.02 10*3/uL (ref 0.00–0.07)
Basophils Absolute: 0 10*3/uL (ref 0.0–0.1)
Basophils Relative: 1 %
Eosinophils Absolute: 0.5 10*3/uL (ref 0.0–0.5)
Eosinophils Relative: 10 %
HCT: 45.9 % (ref 36.0–46.0)
Hemoglobin: 13.6 g/dL (ref 12.0–15.0)
Immature Granulocytes: 0 %
Lymphocytes Relative: 30 %
Lymphs Abs: 1.6 10*3/uL (ref 0.7–4.0)
MCH: 32 pg (ref 26.0–34.0)
MCHC: 29.6 g/dL — ABNORMAL LOW (ref 30.0–36.0)
MCV: 108 fL — ABNORMAL HIGH (ref 80.0–100.0)
Monocytes Absolute: 0.7 10*3/uL (ref 0.1–1.0)
Monocytes Relative: 13 %
Neutro Abs: 2.4 10*3/uL (ref 1.7–7.7)
Neutrophils Relative %: 46 %
Platelets: 134 10*3/uL — ABNORMAL LOW (ref 150–400)
RBC: 4.25 MIL/uL (ref 3.87–5.11)
RDW: 16.3 % — ABNORMAL HIGH (ref 11.5–15.5)
WBC: 5.2 10*3/uL (ref 4.0–10.5)
nRBC: 0 % (ref 0.0–0.2)

## 2019-10-18 LAB — COMPREHENSIVE METABOLIC PANEL
ALT: 11 U/L (ref 0–44)
AST: 17 U/L (ref 15–41)
Albumin: 3.6 g/dL (ref 3.5–5.0)
Alkaline Phosphatase: 68 U/L (ref 38–126)
Anion gap: 15 (ref 5–15)
BUN: 25 mg/dL — ABNORMAL HIGH (ref 8–23)
CO2: 28 mmol/L (ref 22–32)
Calcium: 9.1 mg/dL (ref 8.9–10.3)
Chloride: 98 mmol/L (ref 98–111)
Creatinine, Ser: 6.98 mg/dL — ABNORMAL HIGH (ref 0.44–1.00)
GFR calc Af Amer: 6 mL/min — ABNORMAL LOW (ref >60)
GFR calc non Af Amer: 5 mL/min — ABNORMAL LOW (ref >60)
Glucose, Bld: 137 mg/dL — ABNORMAL HIGH (ref 70–99)
Potassium: 3.1 mmol/L — ABNORMAL LOW (ref 3.5–5.1)
Sodium: 141 mmol/L (ref 135–145)
Total Bilirubin: 0.3 mg/dL (ref 0.3–1.2)
Total Protein: 7.4 g/dL (ref 6.5–8.1)

## 2019-10-18 LAB — TROPONIN I (HIGH SENSITIVITY): Troponin I (High Sensitivity): 22 ng/L — ABNORMAL HIGH (ref <18)

## 2019-10-18 MED ORDER — ADENOSINE 6 MG/2ML IV SOLN: 6.0000 mg | Freq: Once | INTRAVENOUS | Status: AC

## 2019-10-18 MED ORDER — METOPROLOL TARTRATE 5 MG/5ML IV SOLN
2.5000 mg | Freq: Once | INTRAVENOUS | Status: AC
  Administered 2019-10-18: 2.5 mg via INTRAVENOUS
  Filled 2019-10-18: qty 5

## 2019-10-18 MED ORDER — ADENOSINE 6 MG/2ML IV SOLN
INTRAVENOUS | Status: AC
  Administered 2019-10-18: 6 mg via INTRAVENOUS
  Filled 2019-10-18: qty 6

## 2019-10-18 MED ORDER — METOPROLOL TARTRATE 5 MG/5ML IV SOLN: 2.5000 mg | Freq: Once | INTRAVENOUS | Status: AC

## 2019-10-18 MED ORDER — METOPROLOL TARTRATE 5 MG/5ML IV SOLN
INTRAVENOUS | Status: AC
  Administered 2019-10-18: 2.5 mg via INTRAVENOUS
  Filled 2019-10-18: qty 5

## 2019-10-18 NOTE — Telephone Encounter (Signed)
CT chest 10/18/19 >> advanced centrilobular and paraseptal emphysema, bronchial wall thickening, 2 x 1.6 cm LUL nodule, 1.4 x 0.8 cm LUL, 2.1 x 1.2 cm RLL nodule   Pt developed shortness of breath and chest discomfort after CT scan and went to [facility] ER.  She was treated by ER staff and deemed stable for D/C home.  I was working in [facility] and spoke with Natasha [name] about her CT chest results prior to her d/c from the ER.  Will have my office staff contact her to schedule PET scan.  Will then determine whether she needs biopsy, and if so what the best approach would be.

## 2019-10-18 NOTE — ED Provider Notes (Signed)
[facility] DEPT Provider Note   CSN: [id number] Arrival date & time: 10/18/19  1455     History Chief Complaint  Patient presents with  . Chest Pain  . Shortness of Breath    Natasha [name] is a 74 y.o. female.  The history is provided by the patient and medical records. No language interpreter was used.  Chest Pain Associated symptoms: shortness of breath   Shortness of Breath Associated symptoms: chest pain    Natasha [name] is a 74 y.o. female who presents to the Emergency Department complaining of chest pain/sob. She presents the emergency department complaining of chest pain and shortness of breath that began around 1 o'clock today. She was scheduled for a follow-up CT scan of her chest and decided to come into the emergency department for evaluation after the image was performed. Symptoms are constant but did improve after taking nitroglycerin. Denies any fevers. She recently recovered from pneumonia and had the CT scan ordered as a follow-up. She does have a history of ESR D and dialyzed is Monday, Wednesday, Friday. She received full dialysis session on Monday. She is also on chronic steroids and to take her prednisone today. She is on chronic anticoagulation for history of DVT. Symptoms are severe in nature.    Past Medical History:  Diagnosis Date  . Anal fistula   . Anemia in chronic kidney disease (CKD)   . Aortic insufficiency    Echo 3/18: mod conc LVH, EF 60-65, no RWMA, Gr 1 DD, mod AI, mild MR, normal RVSF, Trivial TR  . Arthritis   . Borderline hypertension   . Bulging disc    L3-L4  . Chronic diarrhea    due to crohn's  . CKD (chronic kidney disease), stage IV ([place])    MWF- [address]  . Crohn's disease ([place])    chronic ileitis  . Dyspnea    with exertion  . Emphysema/COPD ([place])   . GERD (gastroesophageal reflux disease)    denies  . History of blood transfusion   . History of glaucoma   . History of kidney  stones   . History of pancreatitis    2008--  mercaptopurine  . History of small bowel obstruction    12-03-2010  due to crohn's ileitis  . Hypertension    no longer on medications  . Hypothyroidism, postsurgical    multinodule w/ hurthle cells  . Osteoporosis   . Perianal Crohn's disease ([place])   . Peripheral vascular disease ([place])    blood clot behind knee left leg  . Polyarthralgia   . Seizures ([place])    03/2017  . Wears partial dentures    upper    Patient Active Problem List   Diagnosis Date Noted  . Pneumonia of right lower lobe due to infectious organism 09/27/2019  . History of COVID-19 04/13/2019  . Acute on chronic respiratory failure with hypoxia ([place]) 04/13/2019  . Chronic anticoagulation 04/13/2019  . Medication management 02/22/2019  . Chronic respiratory failure with hypoxia ([place]) 09/23/2018  . Hx of Pulmonary embolism ([place]) 05/28/2018  . COPD with acute exacerbation ([place]) 03/31/2018  . Acute sinusitis 03/31/2018  . Crohn's disease of colon with complication ([place])   . Aortic insufficiency 07/30/2016  . Atherosclerosis of aorta ([place]) 07/30/2016  . Pressure injury of skin 07/14/2016  . Gastroenteritis, acute 07/12/2016  . COPD (chronic obstructive pulmonary disease) ([place]) 10/12/2015  . Malnutrition of moderate degree ([place]) 09/13/2014  . history of Left leg  DVT 09/10/2014  . Hypotension 09/10/2014  . Hypoglycemia 09/10/2014  . Ischiorectal abscess 09/09/2014  . Increased anion gap metabolic acidosis   . Generalized abdominal pain   . Chronic diastolic CHF (congestive heart failure) ([place])   . Hypertensive heart disease   . Secondary hyperparathyroidism ([place])   . Other specified hypothyroidism   . Right shoulder pain   . Thrombocytopenia ([place])   . Severe protein-calorie malnutrition ([place])   . Thyroid activity decreased   . Emphysema of lung ([place])   . Nephrolithiasis 03/29/2014  . Spinal stenosis of lumbar region 11/01/2013  . Bilateral leg pain  09/28/2013  . Emphysema/COPD ([place])   . Shortness of breath 09/20/2013  . Protein-calorie malnutrition, severe ([place]) 05/14/2013  . Salmonella enteritidis 06/11/2012  . Hypocalcemia 06/10/2012  . Hypothyroidism 06/10/2012  . Anemia of chronic disease 06/09/2012  . Hypomagnesemia 06/09/2012  . ESRD on dialysis ([place]) 06/09/2012  . Dehydration 06/07/2012  . Crohn's disease without complication ([place]) 20/35/5974  . Metabolic acidosis 16/38/4536  . Hypokalemia 06/07/2012  . Nausea vomiting and diarrhea 06/07/2012  . UTI (lower urinary tract infection) 06/07/2012    Past Surgical History:  Procedure Laterality Date  . ABDOMINAL HYSTERECTOMY  1990   and  Appendectomy  . AV FISTULA PLACEMENT Left 12/08/2017   Procedure: INSERTION OF ARTERIOVENOUS (AV) GRAFT WITH ARTEGRAFT TO LEFT UPPER ARM;  Surgeon: Conrad Bronwood, MD;  Location: [facility];  Service: Vascular;  Laterality: Left;  . BASCILIC VEIN TRANSPOSITION Left 01/09/2017   Procedure: LEFT 1ST STAGE BRACHIAL VEIN TRANSPOSITION;  Surgeon: Conrad [place], MD;  Location: [facility];  Service: Vascular;  Laterality: Left;  . BASCILIC VEIN TRANSPOSITION Left 06/16/2017   Procedure: Second Stage BASILIC VEIN TRANSPOSITION  LEFT ARM;  Surgeon: Conrad [place], MD;  Location: [facility];  Service: Vascular;  Laterality: Left;  . BIOPSY  03/09/2018   Procedure: BIOPSY;  Surgeon: Wilford Corner, MD;  Location: [facility];  Service: Endoscopy;;  . CHOLECYSTECTOMY    . COLON RESECTION  x3 --  1978,  1987, 1989   ILEAL RESECTION x2/   [place]  . COLONOSCOPY WITH PROPOFOL N/A 09/25/2014   Procedure: COLONOSCOPY WITH PROPOFOL;  Surgeon: Garlan Fair, MD;  [facility];  Service: Endoscopy;  Laterality: N/A;  . COLONOSCOPY WITH PROPOFOL N/A 03/09/2018   Procedure: COLONOSCOPY WITH PROPOFOL;  Surgeon: Wilford Corner, MD;  [facility];  Service: Endoscopy;  Laterality: N/A;  . CYSTOSCOPY W/ URETERAL STENT PLACEMENT Right 11/19/2016     Procedure: CYSTOSCOPY WITH RIGHT RETROGRADE PYELOGRAM/ URETEROSCOPY WITH LASER AND RIGHT URETERAL STENT PLACEMENT;  Surgeon: Ardis Hughs, MD;  Location: [facility];  Service: Urology;  Laterality: Right;  . ESOPHAGOGASTRODUODENOSCOPY  03/04/2012   Procedure: ESOPHAGOGASTRODUODENOSCOPY (EGD);  Surgeon: Garlan Fair, MD;  [facility];  Service: Endoscopy;  Laterality: N/A;  . EXAMINATION UNDER ANESTHESIA N/A 09/12/2014   Procedure: EXAM UNDER ANESTHESIA;  Surgeon: Rolm Bookbinder, MD;  Location: [facility];  Service: General;  Laterality: N/A;  . EYE SURGERY     laser  . FLEXIBLE SIGMOIDOSCOPY  03/04/2012   Procedure: FLEXIBLE SIGMOIDOSCOPY;  Surgeon: Garlan Fair, MD;  [facility];  Service: Endoscopy;  Laterality: N/A;  . GLAUCOMA SURGERY Bilateral   . HOLMIUM LASER APPLICATION Right 4/68/0321   Procedure: HOLMIUM LASER APPLICATION;  Surgeon: Ardis Hughs, MD;  [facility];  Service: Urology;  Laterality: Right;  . INCISION AND DRAINAGE PERIRECTAL ABSCESS N/A 09/12/2014  Procedure: IRRIGATION AND DEBRIDEMENT PERIRECTAL ABSCESS;  Surgeon: Rolm Bookbinder, MD;  Location: [facility];  Service: General;  Laterality: N/A;  . IR FLUORO GUIDE CV LINE RIGHT  03/20/2017  . IR US GUIDE VASC ACCESS RIGHT  03/20/2017  . NEGATIVE SLEEP STUDY  2008  . PLACEMENT OF SETON N/A 12/01/2014   Procedure: PLACEMENT OF SETON;  Surgeon: [name], MD;  Location[facility];  Service: General;  Laterality: N/A;  . POLYPECTOMY  03/09/2018   Procedure: POLYPECTOMY;  Surgeon: Wilford Corner, MD;  [facility];  Service: Endoscopy;;  . RECTAL EXAM UNDER ANESTHESIA N/A 12/01/2014   Procedure: RECTAL EXAM UNDER ANESTHESIA;  Surgeon: [name], MD;  Location[facility];  Service: General;  Laterality: N/A;  . TOTAL THYROIDECTOMY  10-13-2003  . TRANSTHORACIC ECHOCARDIOGRAM  07-11-2013   mild LVH,  ef 55%,  moderate AR,  mild MR and  TR,  trivial PR  . TUBAL LIGATION       OB History   No obstetric history on file.     Family History  Problem Relation Age of Onset  . Hypertension Father   . Heart disease Father   . Heart attack Father   . Diverticulitis Mother   . COPD Mother   . Hypertension Mother   . Heart disease Mother   . Heart attack Mother   . Heart attack Brother   . Colon cancer Brother   . Diabetes Brother   . Thyroid disease Brother     Social History   Tobacco Use  . Smoking status: Former Smoker    Packs/day: 1.00    Years: 35.00    Pack years: 35.00    Types: Cigarettes    Start date: 02/22/1967    Quit date: 11/27/2001    Years since quitting: 17.9  . Smokeless tobacco: Never Used  Substance Use Topics  . Alcohol use: No  . Drug use: No    Home Medications Prior to Admission medications   Medication Sig Start Date End Date Taking? Authorizing Provider  acetaminophen (TYLENOL) 500 MG tablet Take 1,000 mg by mouth every 6 (six) hours as needed for mild pain.    Yes [provider]  Adalimumab (HUMIRA) 40 MG/0.8ML PSKT Inject 40 mg into the skin every 14 (fourteen) days.    Yes [provider]  albuterol (VENTOLIN HFA) 108 (90 Base) MCG/ACT inhaler INHALE 2 PUFFS BY MOUTH EVERY 6 HOURS IF NEEDED FOR WHEEZING OR SHORTNESS OF BREATH Patient taking differently: Inhale 2 puffs into the lungs every 6 (six) hours as needed for wheezing or shortness of breath.  05/31/19  Yes Chesley Mires, MD  alendronate (FOSAMAX) 70 MG tablet Take 70 mg by mouth every Monday.  11/06/16  Yes [provider]  benzonatate (TESSALON) 100 MG capsule Take 100 mg by mouth 3 (three) times daily as needed for cough.  05/11/19  Yes [provider]  calcitRIOL (ROCALTROL) 0.25 MCG capsule Take 0.25 mcg by mouth every Monday, Wednesday, and Friday with hemodialysis. 02/03/17  Yes [provider]  calcium acetate (PHOSLO) 667 MG capsule Take 1 capsule (667 mg total) by mouth 3  (three) times daily with meals. 06/06/18  Yes Domenic Polite, MD  calcium-vitamin D (OSCAL WITH D) 500-200 MG-UNIT TABS tablet Take 1 tablet by mouth as needed (with dialysis).    Yes [provider]  cyanocobalamin (,VITAMIN B-12,) 1000 MCG/ML injection Inject 1,000 mcg into the muscle every 30 (thirty) days.  Yes [provider]  ELIQUIS 5 MG TABS tablet Take 2.5 mg by mouth 2 (two) times daily. Takes 1/2 tablet (2.68m) bid 08/23/18  Yes [provider]  fluticasone (FLONASE) 50 MCG/ACT nasal spray Place 1-2 sprays into both nostrils daily. 03/19/19  Yes [provider]  Fluticasone-Umeclidin-Vilant (TRELEGY ELLIPTA) 100-62.5-25 MCG/INH AEPB Inhale 1 puff into the lungs daily. 07/25/19  Yes SChesley Mires MD  heparin 1000 UNIT/ML injection Inject 1,000 Units into the vein 3 (three) times a week. 12/01/18 11/30/19 Yes [provider]  ipratropium-albuterol (DUONEB) 0.5-2.5 (3) MG/3ML SOLN Take 3 mLs by nebulization every 6 (six) hours as needed. 12/30/18  Yes NFenton Foy NP  levothyroxine (SYNTHROID, LEVOTHROID) 125 MCG tablet Take 125 mcg by mouth daily before breakfast.    Yes [provider]  lidocaine-prilocaine (EMLA) cream Apply 1 application topically every Monday, Wednesday, and Friday.  11/29/18  Yes [provider]  loperamide (IMODIUM) 2 MG capsule Take 2 capsules (4 mg total) by mouth as needed for diarrhea or loose stools. 03/27/17  Yes GCaren Griffins MD  midodrine (PROAMATINE) 5 MG tablet Take 5 mg by mouth every Monday, Wednesday, and Friday.  04/30/17  Yes [provider]  nitroGLYCERIN (NITROSTAT) 0.4 MG SL tablet Place 1 tablet (0.4 mg total) under the tongue every 5 (five) minutes as needed for chest pain. 05/03/19 10/18/19 Yes Nahser, PWonda Cheng MD  ondansetron (ZOFRAN ODT) 4 MG disintegrating tablet Take 1 tablet (4 mg total) by mouth every 8 (eight) hours as needed for nausea or vomiting. 03/29/18  Yes FDeno Etienne  DO  OXYGEN Inhale 2-3 L into the lungs See admin instructions. Use every night and during the day as needed for shortness of breath   Yes [provider]  predniSONE (DELTASONE) 5 MG tablet Take 5 mg by mouth daily with breakfast.   Yes [provider]  HYDROcodone-acetaminophen (NORCO) 5-325 MG tablet Take 1 tablet by mouth every 6 (six) hours as needed for moderate pain. Patient not taking: Reported on 10/18/2019 12/08/17   EDagoberto Ligas PA-C  Methoxy PEG-Epoetin Beta (MIRCERA IJ) Inject 150 mg as directed every 14 (fourteen) days. 06/13/19 06/11/20  [provider]  Respiratory Therapy Supplies (NEBULIZER) DEVI 1 Device by Does not apply route as needed. 12/07/18   NFenton Foy NP  traMADol (ULTRAM) 50 MG tablet Take 50 mg by mouth as needed. 01/09/17   [provider]  zinc sulfate 220 (50 Zn) MG capsule Take 1 capsule (220 mg total) by mouth daily. Patient not taking: Reported on 10/18/2019 04/17/19   PElodia Florence, MD    Allergies    Mercaptopurine and Remicade [infliximab]  Review of Systems   Review of Systems  Respiratory: Positive for shortness of breath.   Cardiovascular: Positive for chest pain.  All other systems reviewed and are negative.   Physical Exam Updated Vital Signs BP 110/71 (BP Location: Right Arm)   Pulse 88   Temp 98.7 F (37.1 C) (Oral)   Resp (!) 25   SpO2 100%   Physical Exam Vitals and nursing note reviewed.  Constitutional:      General: She is in acute distress.     Appearance: She is well-developed. She is ill-appearing.  HENT:     Head: Normocephalic and atraumatic.  Cardiovascular:     Rate and Rhythm: Regular rhythm. Tachycardia present.     Heart sounds: No murmur.  Pulmonary:     Effort: Pulmonary effort is  normal. No respiratory distress.     Breath sounds: Normal breath sounds.  Abdominal:     Palpations: Abdomen is soft.     Tenderness: There is no abdominal tenderness. There is no  guarding or rebound.  Musculoskeletal:        General: No tenderness.     Comments: Trace edema to bilateral lower extremities  Skin:    General: Skin is warm and dry.  Neurological:     Mental Status: She is alert and oriented to person, place, and time.  Psychiatric:        Behavior: Behavior normal.     ED Results / Procedures / Treatments   Labs (all labs ordered are listed, but only abnormal results are displayed) Labs Reviewed  COMPREHENSIVE METABOLIC PANEL - Abnormal; Notable for the following components:      Result Value   Potassium 3.1 (*)    Glucose, Bld 137 (*)    BUN 25 (*)    Creatinine, Ser 6.98 (*)    GFR calc non Af Amer 5 (*)    GFR calc Af Amer 6 (*)    All other components within normal limits  CBC WITH DIFFERENTIAL/PLATELET - Abnormal; Notable for the following components:   MCV 108.0 (*)    MCHC 29.6 (*)    RDW 16.3 (*)    Platelets 134 (*)    All other components within normal limits  I-STAT CHEM 8, ED - Abnormal; Notable for the following components:   Potassium 3.1 (*)    BUN 25 (*)    Creatinine, Ser 7.70 (*)    Glucose, Bld 134 (*)    Calcium, Ion 1.03 (*)    All other components within normal limits  TROPONIN I (HIGH SENSITIVITY) - Abnormal; Notable for the following components:   Troponin I (High Sensitivity) 22 (*)    All other components within normal limits    EKG EKG Interpretation  Date/Time:  Tuesday Oct 18 2019 15:50:45 EDT Ventricular Rate:  179 PR Interval:    QRS Duration: 66 QT Interval:  251 QTC Calculation: 434 R Axis:   -26 Text Interpretation: Supraventricular tachycardia Borderline left axis deviation Abnormal R-wave progression, early transition Nonspecific T abnormalities, diffuse leads Confirmed by Quintella Reichert [phone]) on 10/18/2019 5:26:09 PM   Radiology CT Chest Wo Contrast  Result Date: 10/18/2019 CLINICAL DATA:  Follow-up pulmonary nodule. Intermittent chest pain and shortness of breath. EXAM: CT CHEST  WITHOUT CONTRAST TECHNIQUE: Multidetector CT imaging of the chest was performed following the standard protocol without IV contrast. COMPARISON:  04/14/2019 FINDINGS: Cardiovascular: The heart size is mildly enlarged. Increase caliber of the pulmonary trunk measures 3.4 cm consistent with PA hypertension. Aortic atherosclerosis. RCA and left circumflex coronary artery calcifications. Mediastinum/Nodes: Thyroidectomy. The trachea appears patent and is midline. Normal appearance of the esophagus. No enlarged supraclavicular, axillary, mediastinal, or hilar lymph nodes. Lungs/Pleura: Advanced changes of centrilobular and paraseptal emphysema with diffuse bronchial wall thickening. Central left upper lobe lung nodule measures 1.6 x 2.0 cm, image 69/5. Suspicious for primary bronchogenic carcinoma. More peripherally within the left upper lobe is a new nodule with spiculated margins measuring 0.8 x 1.4 cm, image 46/5. Also worrisome for malignancy. Finally within the posterior right lower lobe there is a new solid nodular density measuring 2.1 x 1.2 cm, image 108/5. Upper Abdomen: Bilateral medullary calcinosis with multiple kidney stones identified. Gastric diverticula is identified measuring 1.8 cm, image 129/2. Aortic atherosclerosis. Musculoskeletal: No chest wall mass or suspicious bone  lesions identified. The bones appear diffusely osteopenic. No aggressive lytic or sclerotic bone lesions identified. Inferior endplate lucency involving the T10 vertebra has increased in size from previous exam measuring 1.3 cm, favor Schmorl's node deformity, image 68/7. IMPRESSION: 1. Central left upper lobe lung nodule is identified measuring 1.6 x 2.0 cm, suspicious for primary bronchogenic carcinoma. Further investigation with PET-CT is advised. 2. There are 2 additional, nonspecific pulmonary nodules identified within the left upper lobe and right lower lobe. These are indeterminate. Metastatic disease versus synchronous primary  lung neoplasms cannot be excluded. Attention to these nodules on PET-CT advised 3. RCA and left circumflex coronary artery calcifications noted. 4. 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 5. Gastric diverticula. Aortic Atherosclerosis (ICD10-I70.0) and Emphysema (ICD10-J43.9). Electronically Signed   By: Kerby Moors M.D.   On: 10/18/2019 16:10    Procedures Procedures (including critical care time) CRITICAL CARE Performed by: Quintella Reichert   Total critical care time: 35 minutes  Critical care time was exclusive of separately billable procedures and treating other patients.  Critical care was necessary to treat or prevent imminent or life-threatening deterioration.  Critical care was time spent personally by me on the following activities: development of treatment plan with patient and/or surrogate as well as nursing, discussions with consultants, evaluation of patient's response to treatment, examination of patient, obtaining history from patient or surrogate, ordering and performing treatments and interventions, ordering and review of laboratory studies, ordering and review of radiographic studies, pulse oximetry and re-evaluation of patient's condition.  Medications Ordered in ED Medications  metoprolol tartrate (LOPRESSOR) injection 2.5 mg (2.5 mg Intravenous Given 10/18/19 1531)  metoprolol tartrate (LOPRESSOR) injection 2.5 mg (2.5 mg Intravenous Given 10/18/19 1557)  adenosine (ADENOCARD) 6 MG/2ML injection 6 mg (6 mg Intravenous Given 10/18/19 1646)    ED Course  I have reviewed the triage vital signs and the nursing notes.  Pertinent labs & imaging results that were available during my care of the patient were reviewed by me and considered in my medical decision making (see chart for details).    MDM Rules/Calculators/A&P                     Pt with hx/o CHF, ESRD on HD, DVT here for evaluation of chest pain that started today.  Presenting rhythm EKG with  SVT. Concern for possible underlying flutter and she was treated with metoprolol 2.67m with improvement in rhythm to 177.  Pt pain free on recheck.  Rpt EKG with persistent SVT.  D/w risks and benefits of adenosine trial and pt initially declined and she was treated with a second dose of metoprolol with no change in rhythm. Following additional discussions pt agreeable to adenosine administration and following 675mshe converted to sinus rhythm.   Pt asymptomatic on recheck.  Labs with stable ESRD, mild hypokalemia - will not replace due to ESRD.  Dialysis planned for tomorrow.  Troponin is mildly elevated - doubt ACS, acute PE.  Discussed case with Dr. NiJohnsie Cancelith Cardiology.  Will d/c pt home with outpatient cards follow up and return precautions.    Final Clinical Impression(s) / ED Diagnoses Final diagnoses:  SVT (supraventricular tachycardia) ([place]   Rx / DC Orders ED Discharge Orders    None       ReQuintella ReichertMD 10/18/19 2101

## 2019-10-18 NOTE — ED Triage Notes (Signed)
Patient here from CT with complaints of chest pain and SOB. Reports that she was here just to get a CT scan due to possible pneumonia. Normally wears 3L constantly. Reports taking 2 nitro tables 11mn apart.

## 2019-10-24 NOTE — Progress Notes (Deleted)
Cardiology Office Note:    Date:  10/24/2019   ID:  Natasha [name], DOB 1946/01/31, MRN [id number]  PCP:  [name], MD  Cardiologist:   Jasmine Maceachern  Electrophysiologist:  None   Referring MD: [name],*   No chief complaint on file.  Problem list 1.  Chronic diastolic congestive heart failure 2.  COPD 3.  Chronic kidney disease 4.  Aortic insufficiency 5.  Essential hypertension 6.  Chronic anemia 7.  History of DVT/pulmonary embolus.  History of Present Illness:    Natasha [name] is a 74 y.o. female with a hx of  Chronic systolic CHF Was admitted with COVID several months ago  Is now on dialysis  Is now on home O2.   3 liters per minute.   Still eats some salt.    She says that her favorite thing to eat are salty cheese puffs and she eats many of them every day. ( a whole famly sized bag after she comes home from dialysis )   Her LV fuction shows hyperdynamic left ventricular systolic function with an ejection fraction of greater than 65%.  She does have grade 1 diastolic dysfunction.  She has moderate pulmonary hypertension with an estimated PA pressure of 44 mmHg.  October 25, 2019:    Natasha [name] was last seen in Jan. 2021 for chronic diastolic CHF . She was eating lots of salt at the time  She recently presented to the ER with SVT at 180. She converted to NSR with preloading of metoprolol 5 mg IV and then Adenosine 6 mg IV .     Past Medical History:  Diagnosis Date  . Anal fistula   . Anemia in chronic kidney disease (CKD)   . Aortic insufficiency    Echo 3/18: mod conc LVH, EF 60-65, no RWMA, Gr 1 DD, mod AI, mild MR, normal RVSF, Trivial TR  . Arthritis   . Borderline hypertension   . Bulging disc    L3-L4  . Chronic diarrhea    due to crohn's  . CKD (chronic kidney disease), stage IV ([place])    MWF- [address]  . Crohn's disease ([place])    chronic ileitis  . Dyspnea    with exertion  . Emphysema/COPD ([place])   . GERD (gastroesophageal  reflux disease)    denies  . History of blood transfusion   . History of glaucoma   . History of kidney stones   . History of pancreatitis    2008--  mercaptopurine  . History of small bowel obstruction    12-03-2010  due to crohn's ileitis  . Hypertension    no longer on medications  . Hypothyroidism, postsurgical    multinodule w/ hurthle cells  . Osteoporosis   . Perianal Crohn's disease ([place])   . Peripheral vascular disease ([place])    blood clot behind knee left leg  . Polyarthralgia   . Seizures ([place])    03/2017  . Wears partial dentures    upper    Past Surgical History:  Procedure Laterality Date  . ABDOMINAL HYSTERECTOMY  1990   and  Appendectomy  . AV FISTULA PLACEMENT Left 12/08/2017   Procedure: INSERTION OF ARTERIOVENOUS (AV) GRAFT WITH ARTEGRAFT TO LEFT UPPER ARM;  Surgeon: Conrad Madras, MD;  Location: [facility];  Service: Vascular;  Laterality: Left;  . BASCILIC VEIN TRANSPOSITION Left 01/09/2017   Procedure: LEFT 1ST STAGE BRACHIAL VEIN TRANSPOSITION;  Surgeon: Conrad [place] Hills, MD;  Location: [facility];  Service:  Vascular;  Laterality: Left;  . BASCILIC VEIN TRANSPOSITION Left 06/16/2017   Procedure: Second Stage BASILIC VEIN TRANSPOSITION  LEFT ARM;  Surgeon: Conrad Wolcott, MD;  Location: [facility];  Service: Vascular;  Laterality: Left;  . BIOPSY  03/09/2018   Procedure: BIOPSY;  Surgeon: Wilford Corner, MD;  Location: [facility];  Service: Endoscopy;;  . CHOLECYSTECTOMY    . COLON RESECTION  x3 --  1978,  1987, 1989   ILEAL RESECTION x2/   [name]  . COLONOSCOPY WITH PROPOFOL N/A 09/25/2014   Procedure: COLONOSCOPY WITH PROPOFOL;  Surgeon: Garlan Fair, MD;  [facility]: [facility];  Service: Endoscopy;  Laterality: N/A;  . COLONOSCOPY WITH PROPOFOL N/A 03/09/2018   Procedure: COLONOSCOPY WITH PROPOFOL;  Surgeon: Wilford Corner, MD;  [facility];  Service: Endoscopy;  Laterality: N/A;  . CYSTOSCOPY W/ URETERAL STENT PLACEMENT Right 11/19/2016    Procedure: CYSTOSCOPY WITH RIGHT RETROGRADE PYELOGRAM/ URETEROSCOPY WITH LASER AND RIGHT URETERAL STENT PLACEMENT;  Surgeon: Ardis Hughs, MD;  [facility]: [facility];  Service: Urology;  Laterality: Right;  . ESOPHAGOGASTRODUODENOSCOPY  03/04/2012   Procedure: ESOPHAGOGASTRODUODENOSCOPY (EGD);  Surgeon: Garlan Fair, MD;  Location: [facility];  Service: Endoscopy;  Laterality: N/A;  . EXAMINATION UNDER ANESTHESIA N/A 09/12/2014   Procedure: EXAM UNDER ANESTHESIA;  Surgeon: Rolm Bookbinder, MD;  Location: [facility];  Service: General;  Laterality: N/A;  . EYE SURGERY     laser  . FLEXIBLE SIGMOIDOSCOPY  03/04/2012   Procedure: FLEXIBLE SIGMOIDOSCOPY;  Surgeon: Garlan Fair, MD;  [facility];  Service: Endoscopy;  Laterality: N/A;  . GLAUCOMA SURGERY Bilateral   . HOLMIUM LASER APPLICATION Right 8/84/1660   Procedure: HOLMIUM LASER APPLICATION;  Surgeon: Ardis Hughs, MD;  Location: [facility];  Service: Urology;  Laterality: Right;  . INCISION AND DRAINAGE PERIRECTAL ABSCESS N/A 09/12/2014   Procedure: IRRIGATION AND DEBRIDEMENT PERIRECTAL ABSCESS;  Surgeon: Rolm Bookbinder, MD;  Location: [facility];  Service: General;  Laterality: N/A;  . IR FLUORO GUIDE CV LINE RIGHT  03/20/2017  . IR US GUIDE VASC ACCESS RIGHT  03/20/2017  . NEGATIVE SLEEP STUDY  2008  . PLACEMENT OF SETON N/A 12/01/2014   Procedure: PLACEMENT OF SETON;  Surgeon: [name], MD;  Location: [facility];  Service: General;  Laterality: N/A;  . POLYPECTOMY  03/09/2018   Procedure: POLYPECTOMY;  Surgeon: Wilford Corner, MD;  [facility];  Service: Endoscopy;;  . RECTAL EXAM UNDER ANESTHESIA N/A 12/01/2014   Procedure: RECTAL EXAM UNDER ANESTHESIA;  Surgeon: [name], MD;  [facility];  Service: General;  Laterality: N/A;  . TOTAL THYROIDECTOMY  10-13-2003  . TRANSTHORACIC ECHOCARDIOGRAM  07-11-2013   mild LVH,  ef 55%,  moderate AR,  mild MR and  TR,  trivial PR  . TUBAL LIGATION      Current Medications: No outpatient medications have been marked as taking for the 10/25/19 encounter (Appointment) with Nasim Habeeb, Wonda Cheng, MD.     Allergies:   Mercaptopurine and Remicade [infliximab]   Social History   Socioeconomic History  . Marital status: Married    Spouse name: [name]  . Number of children: 3  . Years of education: 13  . Highest education level: Not on file  Occupational History  . Occupation: Retired    Fish farm manager: RETIRED  Tobacco Use  . Smoking status: Former Smoker    Packs/day: 1.00    Years: 35.00    Pack years: 35.00  Types: Cigarettes    Start date: 02/22/1967    Quit date: 11/27/2001    Years since quitting: 17.9  . Smokeless tobacco: Never Used  Substance and Sexual Activity  . Alcohol use: No  . Drug use: No  . Sexual activity: Never  Other Topics Concern  . Not on file  Social History Narrative   Patient is married [name]) and lives at home with her husband.   Patient has three children.   Patient is retired on disability.   Patient drinks two cups of caffeine daily.   Patient is right-handed.         Social Determinants of Health   Financial Resource Strain:   . Difficulty of Paying Living Expenses:   Food Insecurity:   . Worried About Charity fundraiser in the Last Year:   . Arboriculturist in the Last Year:   Transportation Needs:   . Film/video editor (Medical):   [name] Lack of Transportation (Non-Medical):   Physical Activity:   . Days of Exercise per Week:   . Minutes of Exercise per Session:   Stress:   . Feeling of Stress :   Social Connections:   . Frequency of Communication with Friends and Family:   . Frequency of Social Gatherings with Friends and Family:   . Attends Religious Services:   . Active Member of Clubs or Organizations:   . Attends Archivist Meetings:   [name] Marital Status:      Family History: The patient's family history includes COPD in her  mother; Colon cancer in her brother; Diabetes in her brother; Diverticulitis in her mother; Heart attack in her brother, father, and mother; Heart disease in her father and mother; Hypertension in her father and mother; Thyroid disease in her brother.  ROS:   Please see the history of present illness.     All other systems reviewed and are negative.  EKGs/Labs/Other Studies Reviewed:    The following studies were reviewed today:     Recent Labs: 04/15/2019: TSH 4.294 04/16/2019: Magnesium 1.6 10/18/2019: ALT 11; BUN 25; Creatinine, Ser 7.70; Hemoglobin 13.3; Platelets 134; Potassium 3.1; Sodium 140  Recent Lipid Panel No results found for: CHOL, TRIG, HDL, CHOLHDL, VLDL, LDLCALC, LDLDIRECT  Physical Exam: There were no vitals taken for this visit.  GEN:  Well nourished, well developed in no acute distress HEENT: Normal NECK: No JVD; No carotid bruits LYMPHATICS: No lymphadenopathy CARDIAC: RRR ***, no murmurs, rubs, gallops RESPIRATORY:  Clear to auscultation without rales, wheezing or rhonchi  ABDOMEN: Soft, non-tender, non-distended MUSCULOSKELETAL:  No edema; No deformity  SKIN: Warm and dry NEUROLOGIC:  Alert and oriented x 3  EKG:    ASSESSMENT:    No diagnosis found. PLAN:    In order of problems listed above:  Chronic diastolic congestive heart failure: H    Medication Adjustments/Labs and Tests Ordered: Current medicines are reviewed at length with the patient today.  Concerns regarding medicines are outlined above.  No orders of the defined types were placed in this encounter.  No orders of the defined types were placed in this encounter.   There are no Patient Instructions on file for this visit.   Signed, Mertie Moores, MD  10/24/2019 9:30 PM    [place]

## 2019-10-25 ENCOUNTER — Other Ambulatory Visit: Payer: Self-pay

## 2019-10-25 ENCOUNTER — Ambulatory Visit: Payer: Medicare Other | Admitting: Cardiovascular Disease

## 2019-10-28 ENCOUNTER — Encounter (HOSPITAL_COMMUNITY): Payer: Medicare Other

## 2019-11-01 ENCOUNTER — Ambulatory Visit (HOSPITAL_COMMUNITY)
Admission: RE | Admit: 2019-11-01 | Discharge: 2019-11-01 | Disposition: A | Discharge status: Home / Self Care | Payer: Medicare Other | Source: Ambulatory Visit | Attending: Pulmonary Disease | Admitting: Pulmonary Disease

## 2019-11-01 ENCOUNTER — Other Ambulatory Visit: Payer: Self-pay

## 2019-11-01 DIAGNOSIS — I700 Atherosclerosis of aorta: Secondary | ICD-10-CM | POA: Insufficient documentation

## 2019-11-01 DIAGNOSIS — N200 Calculus of kidney: Secondary | ICD-10-CM | POA: Diagnosis not present

## 2019-11-01 DIAGNOSIS — C771 Secondary and unspecified malignant neoplasm of intrathoracic lymph nodes: Secondary | ICD-10-CM | POA: Insufficient documentation

## 2019-11-01 DIAGNOSIS — I2510 Atherosclerotic heart disease of native coronary artery without angina pectoris: Secondary | ICD-10-CM | POA: Insufficient documentation

## 2019-11-01 DIAGNOSIS — C7951 Secondary malignant neoplasm of bone: Secondary | ICD-10-CM | POA: Insufficient documentation

## 2019-11-01 DIAGNOSIS — K5730 Diverticulosis of large intestine without perforation or abscess without bleeding: Secondary | ICD-10-CM | POA: Insufficient documentation

## 2019-11-01 DIAGNOSIS — R911 Solitary pulmonary nodule: Secondary | ICD-10-CM | POA: Insufficient documentation

## 2019-11-01 LAB — GLUCOSE, CAPILLARY: Glucose-Capillary: 74 mg/dL (ref 70–99)

## 2019-11-01 MED ORDER — FLUDEOXYGLUCOSE F - 18 (FDG) INJECTION
5.1000 | Freq: Once | INTRAVENOUS | Status: AC | PRN
  Administered 2019-11-01: 5.1 via INTRAVENOUS

## 2019-11-02 ENCOUNTER — Telehealth: Payer: Self-pay | Admitting: Pulmonary Disease

## 2019-11-02 DIAGNOSIS — C3482 Malignant neoplasm of overlapping sites of left bronchus and lung: Secondary | ICD-10-CM

## 2019-11-02 NOTE — Telephone Encounter (Signed)
PET scan 11/01/19 >> 2.3 x 1.9 cm LUL mass 16.2 SUV, Lt suprahilar LN 13.4 SUV, Lt hilar LN 11.2 SUV, metastatic lesion to T10 and T11   Results d/w pt.  Explained she likely has primary lung cancer.  Will arrange for referral to [facility].

## 2019-11-04 ENCOUNTER — Telehealth: Payer: Self-pay | Admitting: *Deleted

## 2019-11-04 ENCOUNTER — Telehealth: Payer: Self-pay | Admitting: Pulmonary Disease

## 2019-11-04 NOTE — Telephone Encounter (Signed)
Reviewed patient's chart. It looks like Natasha [name] from oncology had called the patient earlier. Provided her with Dana's name and number. She verbalized understanding.   Nothing further needed at time of call.

## 2019-11-04 NOTE — Telephone Encounter (Signed)
I received referral. I called Ms. Biermann to schedule her to be seen with [facility].  I was unable to reach her but did leave vm message for her to call me with my name and phone number.

## 2019-11-08 ENCOUNTER — Encounter: Payer: Self-pay | Admitting: *Deleted

## 2019-11-08 ENCOUNTER — Telehealth: Payer: Self-pay | Admitting: Physician Assistant

## 2019-11-08 DIAGNOSIS — R918 Other nonspecific abnormal finding of lung field: Secondary | ICD-10-CM

## 2019-11-08 NOTE — Progress Notes (Signed)
I updated new patient coordinator to call and schedule Natasha [name] to be seen on 11/15/19 with Cassie PA

## 2019-11-08 NOTE — Telephone Encounter (Signed)
Received a new pt referral from [facility] for new dx of lung cancer. Ms. Ziegler has been scheduled to see Cassie on 6/22 at 1:30pm w/labs at 1pm. I called and left the appt date and time on the pt's voicemail.

## 2019-11-10 ENCOUNTER — Encounter: Payer: Self-pay | Admitting: *Deleted

## 2019-11-10 ENCOUNTER — Other Ambulatory Visit: Payer: Self-pay | Admitting: *Deleted

## 2019-11-10 NOTE — Progress Notes (Signed)
The proposed treatment discussed in cancer conference 11/10/19 is for discussion purpose only and is not a binding recommendation.  The patient was not physically examined nor present for their treatment options.  Therefore, final treatment plans cannot be decided.

## 2019-11-15 ENCOUNTER — Inpatient Hospital Stay: Payer: Medicare Other

## 2019-11-15 ENCOUNTER — Encounter: Payer: Self-pay | Admitting: Physician Assistant

## 2019-11-15 ENCOUNTER — Other Ambulatory Visit: Payer: Self-pay

## 2019-11-15 ENCOUNTER — Inpatient Hospital Stay: Payer: Medicare Other | Attending: Physician Assistant | Admitting: Physician Assistant

## 2019-11-15 VITALS — BP 126/65 | HR 97 | Temp 97.7°F | Resp 18 | Ht 64.0 in | Wt 112.0 lb

## 2019-11-15 DIAGNOSIS — Z87891 Personal history of nicotine dependence: Secondary | ICD-10-CM | POA: Insufficient documentation

## 2019-11-15 DIAGNOSIS — K219 Gastro-esophageal reflux disease without esophagitis: Secondary | ICD-10-CM | POA: Diagnosis not present

## 2019-11-15 DIAGNOSIS — C3492 Malignant neoplasm of unspecified part of left bronchus or lung: Secondary | ICD-10-CM

## 2019-11-15 DIAGNOSIS — Z992 Dependence on renal dialysis: Secondary | ICD-10-CM | POA: Diagnosis not present

## 2019-11-15 DIAGNOSIS — Z8249 Family history of ischemic heart disease and other diseases of the circulatory system: Secondary | ICD-10-CM | POA: Diagnosis not present

## 2019-11-15 DIAGNOSIS — C3412 Malignant neoplasm of upper lobe, left bronchus or lung: Secondary | ICD-10-CM | POA: Diagnosis present

## 2019-11-15 DIAGNOSIS — Z9049 Acquired absence of other specified parts of digestive tract: Secondary | ICD-10-CM | POA: Insufficient documentation

## 2019-11-15 DIAGNOSIS — Z8349 Family history of other endocrine, nutritional and metabolic diseases: Secondary | ICD-10-CM | POA: Insufficient documentation

## 2019-11-15 DIAGNOSIS — Z90710 Acquired absence of both cervix and uterus: Secondary | ICD-10-CM | POA: Diagnosis not present

## 2019-11-15 DIAGNOSIS — C7802 Secondary malignant neoplasm of left lung: Secondary | ICD-10-CM | POA: Insufficient documentation

## 2019-11-15 DIAGNOSIS — Z7952 Long term (current) use of systemic steroids: Secondary | ICD-10-CM | POA: Insufficient documentation

## 2019-11-15 DIAGNOSIS — Z79899 Other long term (current) drug therapy: Secondary | ICD-10-CM | POA: Diagnosis not present

## 2019-11-15 DIAGNOSIS — C7951 Secondary malignant neoplasm of bone: Secondary | ICD-10-CM | POA: Diagnosis present

## 2019-11-15 DIAGNOSIS — Z7189 Other specified counseling: Secondary | ICD-10-CM

## 2019-11-15 DIAGNOSIS — K5090 Crohn's disease, unspecified, without complications: Secondary | ICD-10-CM | POA: Diagnosis not present

## 2019-11-15 DIAGNOSIS — E890 Postprocedural hypothyroidism: Secondary | ICD-10-CM | POA: Insufficient documentation

## 2019-11-15 DIAGNOSIS — J449 Chronic obstructive pulmonary disease, unspecified: Secondary | ICD-10-CM | POA: Diagnosis not present

## 2019-11-15 DIAGNOSIS — I10 Essential (primary) hypertension: Secondary | ICD-10-CM | POA: Insufficient documentation

## 2019-11-15 DIAGNOSIS — R918 Other nonspecific abnormal finding of lung field: Secondary | ICD-10-CM

## 2019-11-15 DIAGNOSIS — Z7901 Long term (current) use of anticoagulants: Secondary | ICD-10-CM | POA: Insufficient documentation

## 2019-11-15 DIAGNOSIS — Z800 Family history of malignant neoplasm of digestive organs: Secondary | ICD-10-CM | POA: Diagnosis not present

## 2019-11-15 DIAGNOSIS — I2510 Atherosclerotic heart disease of native coronary artery without angina pectoris: Secondary | ICD-10-CM | POA: Diagnosis not present

## 2019-11-15 DIAGNOSIS — D631 Anemia in chronic kidney disease: Secondary | ICD-10-CM | POA: Diagnosis not present

## 2019-11-15 DIAGNOSIS — N186 End stage renal disease: Secondary | ICD-10-CM | POA: Insufficient documentation

## 2019-11-15 DIAGNOSIS — Z833 Family history of diabetes mellitus: Secondary | ICD-10-CM | POA: Diagnosis not present

## 2019-11-15 DIAGNOSIS — Z515 Encounter for palliative care: Secondary | ICD-10-CM | POA: Insufficient documentation

## 2019-11-15 LAB — CMP (CANCER CENTER ONLY)
ALT: 10 U/L (ref 0–44)
AST: 16 U/L (ref 15–41)
Albumin: 3.1 g/dL — ABNORMAL LOW (ref 3.5–5.0)
Alkaline Phosphatase: 82 U/L (ref 38–126)
Anion gap: 13 (ref 5–15)
BUN: 23 mg/dL (ref 8–23)
CO2: 24 mmol/L (ref 22–32)
Calcium: 8.7 mg/dL — ABNORMAL LOW (ref 8.9–10.3)
Chloride: 103 mmol/L (ref 98–111)
Creatinine: 6.57 mg/dL (ref 0.44–1.00)
GFR, Est AFR Am: 7 mL/min — ABNORMAL LOW (ref >60)
GFR, Estimated: 6 mL/min — ABNORMAL LOW (ref >60)
Glucose, Bld: 75 mg/dL (ref 70–99)
Potassium: 3 mmol/L — CL (ref 3.5–5.1)
Sodium: 140 mmol/L (ref 135–145)
Total Bilirubin: 0.6 mg/dL (ref 0.3–1.2)
Total Protein: 6.8 g/dL (ref 6.5–8.1)

## 2019-11-15 LAB — CBC WITH DIFFERENTIAL (CANCER CENTER ONLY)
Abs Immature Granulocytes: 0.02 10*3/uL (ref 0.00–0.07)
Basophils Absolute: 0 10*3/uL (ref 0.0–0.1)
Basophils Relative: 1 %
Eosinophils Absolute: 0.3 10*3/uL (ref 0.0–0.5)
Eosinophils Relative: 5 %
HCT: 38.3 % (ref 36.0–46.0)
Hemoglobin: 11.6 g/dL — ABNORMAL LOW (ref 12.0–15.0)
Immature Granulocytes: 0 %
Lymphocytes Relative: 43 %
Lymphs Abs: 2.2 10*3/uL (ref 0.7–4.0)
MCH: 32 pg (ref 26.0–34.0)
MCHC: 30.3 g/dL (ref 30.0–36.0)
MCV: 105.5 fL — ABNORMAL HIGH (ref 80.0–100.0)
Monocytes Absolute: 0.7 10*3/uL (ref 0.1–1.0)
Monocytes Relative: 14 %
Neutro Abs: 1.9 10*3/uL (ref 1.7–7.7)
Neutrophils Relative %: 37 %
Platelet Count: 112 10*3/uL — ABNORMAL LOW (ref 150–400)
RBC: 3.63 MIL/uL — ABNORMAL LOW (ref 3.87–5.11)
RDW: 14.7 % (ref 11.5–15.5)
WBC Count: 5.1 10*3/uL (ref 4.0–10.5)
nRBC: 0 % (ref 0.0–0.2)

## 2019-11-15 NOTE — Progress Notes (Signed)
[place] Telephone:([phone]) [phone]   [phone]  CONSULT NOTE  REFERRING PHYSICIAN: Dr. Halford Chessman  REASON FOR CONSULTATION:  Suspicious Stage IV Lung Cancer  HPI Natasha [name] is a 74 y.o. female with a past medical history significant for end-stage renal disease on dialysis Monday, Wednesday, and Friday, COPD, Crohn's disease, hypothyroidism, spinal stenosis, pneumonia, aortic insufficiency, gastroenteritis, and nephrolithiasis is referred to the clinic for evaluation of suspicious stage IV lung cancer.  The patient was accompanied by her husband at her appointment today.  The patient states that her work-up began in either March or April 2021 after she presented to her primary care provider for evaluation of a reported cold, nausea, vomiting, abdominal, and back pain.  The patient has a history of Crohn's disease and was recommended that she reach out to Dr. Michail Sermon, her gastroenterologist.  Dr. Michail Sermon ordered a CT scan of the abdomen and pelvis on 09/20/2019 which noted peribronchovascular airspace consolidation in the right lower lobe which may reflect bronchopneumonia or sequela of aspiration.  Dr. Michail Sermon then shared the patient's CT scan with Dr. Halford Chessman, the patient's pulmonologist.  Dr. Halford Chessman arrange for a CT scan of the chest on 10/18/2019.  The patient was also seen in the emergency room this day for the chief complaint of chest pain/shortness of breath and was found to have SVT and recommended to follow-up with outpatient cardiology.  CT scan of the chest noted a central left upper lobe lung nodule 1.6 x 2 cm which was suspicious for primary bronchogenic carcinoma.  There was also 2 additional nonspecific pulmonary nodules identified within the left upper lobe and the right lower lobe which were indeterminate.   The patient then had a staging PET scan performed on 11/01/2019 noted a 2.3 cm left upper lobe nodule and osseous metastasis at T10 and T11.  There is also  associated left hilar/suprahilar nodal metastases.  The right lower lobe nodule opacity was improving and was favored to be infectious.  The left upper lobe nodule opacity was grossly unchanged and indeterminate.  The patient is scheduled to see pulmonology on 6/24 for consideration of a biopsy to confirm the diagnosis.  Today, the patient is feeling "pretty good".  She reports that her main concern is related to fatigue from dialysis.  He denies any recent fever, chills, night sweats, or weight loss.  She reports her baseline dyspnea on exertion.  She denies any chest pain, cough, or hemoptysis.  She reports persistent and chronic low back pain which has been going on for several years for which she was evaluated by an orthopedic physician.  She denies any back pain in her thoracic region.  She denies any current nausea, vomiting, diarrhea, or constipation.  Family history consist of a mother who had diabetes mellitus.  The patient's mother passed away from what sounded like sepsis per patient description.  The patient's father had heart disease/a myocardial infarction.  The patient had another brother who had been undergoing a work-up which involved a lung biopsy.  The patient's brother passed away shortly after and the patient is unsure what his diagnosis was.  The patient used to work in Sales executive at [employer]. The patient is married and has 3 children.  The patient smoked for 55 years 1 pack/day.  She denies any drug use.  She denies any current alcohol use.  HPI  Past Medical History:  Diagnosis Date  . Anal fistula   . Anemia in chronic kidney  disease (CKD)   . Aortic insufficiency    Echo 3/18: mod conc LVH, EF 60-65, no RWMA, Gr 1 DD, mod AI, mild MR, normal RVSF, Trivial TR  . Arthritis   . Borderline hypertension   . Bulging disc    L3-L4  . Chronic diarrhea    due to crohn's  . CKD (chronic kidney disease), stage IV ([place])    MWF- [address]  . Crohn's disease ([place])     chronic ileitis  . Dyspnea    with exertion  . Emphysema/COPD ([place])   . GERD (gastroesophageal reflux disease)    denies  . History of blood transfusion   . History of glaucoma   . History of kidney stones   . History of pancreatitis    2008--  mercaptopurine  . History of small bowel obstruction    12-03-2010  due to crohn's ileitis  . Hypertension    no longer on medications  . Hypothyroidism, postsurgical    multinodule w/ hurthle cells  . Osteoporosis   . Perianal Crohn's disease ([place])   . Peripheral vascular disease ([place])    blood clot behind knee left leg  . Polyarthralgia   . Seizures ([place])    03/2017  . Wears partial dentures    upper    Past Surgical History:  Procedure Laterality Date  . ABDOMINAL HYSTERECTOMY  1990   and  Appendectomy  . AV FISTULA PLACEMENT Left 12/08/2017   Procedure: INSERTION OF ARTERIOVENOUS (AV) GRAFT WITH ARTEGRAFT TO LEFT UPPER ARM;  Surgeon: Conrad Roosevelt, MD;  Location: [facility];  Service: Vascular;  Laterality: Left;  . BASCILIC VEIN TRANSPOSITION Left 01/09/2017   Procedure: LEFT 1ST STAGE BRACHIAL VEIN TRANSPOSITION;  Surgeon: [employer], MD;  Location: [facility];  Service: Vascular;  Laterality: Left;  . BASCILIC VEIN TRANSPOSITION Left 06/16/2017   Procedure: Second Stage BASILIC VEIN TRANSPOSITION  LEFT ARM;  Surgeon: Conrad Lehighton, MD;  Location: [facility];  Service: Vascular;  Laterality: Left;  . BIOPSY  03/09/2018   Procedure: BIOPSY;  Surgeon: Wilford Corner, MD;  Location: [facility];  Service: Endoscopy;;  . CHOLECYSTECTOMY    . COLON RESECTION  x3 --  1978,  1987, 1989   ILEAL RESECTION x2/   [place]  . COLONOSCOPY WITH PROPOFOL N/A 09/25/2014   Procedure: COLONOSCOPY WITH PROPOFOL;  Surgeon: Garlan Fair, MD;  [facility];  Service: Endoscopy;  Laterality: N/A;  . COLONOSCOPY WITH PROPOFOL N/A 03/09/2018   Procedure: COLONOSCOPY WITH PROPOFOL;  Surgeon: Wilford Corner, MD;  [facility];  Service: Endoscopy;  Laterality: N/A;  . CYSTOSCOPY W/ URETERAL STENT PLACEMENT Right 11/19/2016   Procedure: CYSTOSCOPY WITH RIGHT RETROGRADE PYELOGRAM/ URETEROSCOPY WITH LASER AND RIGHT URETERAL STENT PLACEMENT;  Surgeon: Ardis Hughs, MD;  [facility]: [facility];  Service: Urology;  Laterality: Right;  . ESOPHAGOGASTRODUODENOSCOPY  03/04/2012   Procedure: ESOPHAGOGASTRODUODENOSCOPY (EGD);  Surgeon: Garlan Fair, MD;  Location: [facility];  Service: Endoscopy;  Laterality: N/A;  . EXAMINATION UNDER ANESTHESIA N/A 09/12/2014   Procedure: EXAM UNDER ANESTHESIA;  Surgeon: Rolm Bookbinder, MD;  Location: [facility];  Service: General;  Laterality: N/A;  . EYE SURGERY     laser  . FLEXIBLE SIGMOIDOSCOPY  03/04/2012   Procedure: FLEXIBLE SIGMOIDOSCOPY;  Surgeon: Garlan Fair, MD;  [facility];  Service: Endoscopy;  Laterality: N/A;  . GLAUCOMA SURGERY Bilateral   . HOLMIUM LASER APPLICATION Right 9/38/1829   Procedure: HOLMIUM LASER  APPLICATION;  Surgeon: Ardis Hughs, MD;  [facility];  Service: Urology;  Laterality: Right;  . INCISION AND DRAINAGE PERIRECTAL ABSCESS N/A 09/12/2014   Procedure: IRRIGATION AND DEBRIDEMENT PERIRECTAL ABSCESS;  Surgeon: Rolm Bookbinder, MD;  Location: [facility];  Service: General;  Laterality: N/A;  . IR FLUORO GUIDE CV LINE RIGHT  03/20/2017  . IR US GUIDE VASC ACCESS RIGHT  03/20/2017  . NEGATIVE SLEEP STUDY  2008  . PLACEMENT OF SETON N/A 12/01/2014   Procedure: PLACEMENT OF SETON;  Surgeon: [name], MD;  [facility];  Service: General;  Laterality: N/A;  . POLYPECTOMY  03/09/2018   Procedure: POLYPECTOMY;  Surgeon: Wilford Corner, MD;  [facility];  Service: Endoscopy;;  . RECTAL EXAM UNDER ANESTHESIA N/A 12/01/2014   Procedure: RECTAL EXAM UNDER ANESTHESIA;  Surgeon: [name], MD;  Location: [facility];  Service: General;  Laterality: N/A;  . TOTAL  THYROIDECTOMY  10-13-2003  . TRANSTHORACIC ECHOCARDIOGRAM  07-11-2013   mild LVH,  ef 55%,  moderate AR,  mild MR and TR,  trivial PR  . TUBAL LIGATION      Family History  Problem Relation Age of Onset  . Hypertension Father   . Heart disease Father   . Heart attack Father   . Diverticulitis Mother   . COPD Mother   . Hypertension Mother   . Heart disease Mother   . Heart attack Mother   . Heart attack Brother   . Colon cancer Brother   . Diabetes Brother   . Thyroid disease Brother     Social History Social History   Tobacco Use  . Smoking status: Former Smoker    Packs/day: 1.00    Years: 35.00    Pack years: 35.00    Types: Cigarettes    Start date: 02/22/1967    Quit date: 11/27/2001    Years since quitting: 17.9  . Smokeless tobacco: Never Used  Vaping Use  . Vaping Use: Never used  Substance Use Topics  . Alcohol use: No  . Drug use: No    Allergies  Allergen Reactions  . Mercaptopurine Other (See Comments)    Caused pancreatitis  . Remicade [Infliximab] Other (See Comments)    CAUSED JOINT PAIN    Current Outpatient Medications  Medication Sig Dispense Refill  . acetaminophen (TYLENOL) 500 MG tablet Take 1,000 mg by mouth every 6 (six) hours as needed for mild pain.     . Adalimumab (HUMIRA) 40 MG/0.8ML PSKT Inject 40 mg into the skin every 14 (fourteen) days.     [name] albuterol (VENTOLIN HFA) 108 (90 Base) MCG/ACT inhaler INHALE 2 PUFFS BY MOUTH EVERY 6 HOURS IF NEEDED FOR WHEEZING OR SHORTNESS OF BREATH (Patient taking differently: Inhale 2 puffs into the lungs every 6 (six) hours as needed for wheezing or shortness of breath. ) 8.5 g 12  . alendronate (FOSAMAX) 70 MG tablet Take 70 mg by mouth every Monday.   1  . benzonatate (TESSALON) 100 MG capsule Take 100 mg by mouth 3 (three) times daily as needed for cough.     . calcitRIOL (ROCALTROL) 0.25 MCG capsule Take 0.25 mcg by mouth every Monday, Wednesday, and Friday with hemodialysis.  0  . calcium  acetate (PHOSLO) 667 MG capsule Take 1 capsule (667 mg total) by mouth 3 (three) times daily with meals. 90 capsule 0  . calcium-vitamin D (OSCAL WITH D) 500-200 MG-UNIT TABS tablet Take 1 tablet by mouth  as needed (with dialysis).     . cyanocobalamin (,VITAMIN B-12,) 1000 MCG/ML injection Inject 1,000 mcg into the muscle every 30 (thirty) days.     [name] ELIQUIS 5 MG TABS tablet Take 2.5 mg by mouth 2 (two) times daily. Takes 1/2 tablet (2.6m) bid    . fluticasone (FLONASE) 50 MCG/ACT nasal spray Place 1-2 sprays into both nostrils daily.    . Fluticasone-Umeclidin-Vilant (TRELEGY ELLIPTA) 100-62.5-25 MCG/INH AEPB Inhale 1 puff into the lungs daily. 60 each 6  . heparin 1000 UNIT/ML injection Inject 1,000 Units into the vein 3 (three) times a week.    .Marland KitchenHYDROcodone-acetaminophen (NORCO) 5-325 MG tablet Take 1 tablet by mouth every 6 (six) hours as needed for moderate pain. (Patient not taking: Reported on 10/18/2019) 15 tablet 0  . ipratropium-albuterol (DUONEB) 0.5-2.5 (3) MG/3ML SOLN Take 3 mLs by nebulization every 6 (six) hours as needed. 360 mL 3  . levothyroxine (SYNTHROID, LEVOTHROID) 125 MCG tablet Take 125 mcg by mouth daily before breakfast.     . lidocaine-prilocaine (EMLA) cream Apply 1 application topically every Monday, Wednesday, and Friday.     . loperamide (IMODIUM) 2 MG capsule Take 2 capsules (4 mg total) by mouth as needed for diarrhea or loose stools. 30 capsule 0  . Methoxy PEG-Epoetin Beta (MIRCERA IJ) Inject 150 mg as directed every 14 (fourteen) days.    . midodrine (PROAMATINE) 5 MG tablet Take 5 mg by mouth every Monday, Wednesday, and Friday.   0  . nitroGLYCERIN (NITROSTAT) 0.4 MG SL tablet Place 1 tablet (0.4 mg total) under the tongue every 5 (five) minutes as needed for chest pain. 25 tablet 3  . ondansetron (ZOFRAN ODT) 4 MG disintegrating tablet Take 1 tablet (4 mg total) by mouth every 8 (eight) hours as needed for nausea or vomiting. 20 tablet 0  . OXYGEN Inhale 2-3  L into the lungs See admin instructions. Use every night and during the day as needed for shortness of breath    . predniSONE (DELTASONE) 5 MG tablet Take 5 mg by mouth daily with breakfast.    . Respiratory Therapy Supplies (NEBULIZER) DEVI 1 Device by Does not apply route as needed. 1 Device 0  . traMADol (ULTRAM) 50 MG tablet Take 50 mg by mouth as needed.    . zinc sulfate 220 (50 Zn) MG capsule Take 1 capsule (220 mg total) by mouth daily. (Patient not taking: Reported on 10/18/2019) 30 capsule 0   No current facility-administered medications for this visit.    Review of Systems  REVIEW OF SYSTEMS:   Review of Systems  Constitutional: Positive for fatigue.  Negative for appetite change, chills, fever and unexpected weight change.  HENT: Negative for mouth sores, nosebleeds, sore throat and trouble swallowing.   Eyes: Negative for eye problems and icterus.  Respiratory: Positive for baseline dyspnea on exertion and occasional wheezing. negative for cough and hemoptysis Cardiovascular: Negative for chest pain and leg swelling.  Gastrointestinal: Negative for abdominal pain, constipation, diarrhea, nausea and vomiting.  Genitourinary: Negative for bladder incontinence, difficulty urinating, dysuria, frequency and hematuria.   Musculoskeletal: Negative for back pain, gait problem, neck pain and neck stiffness.  Skin: Negative for itching and rash.  Neurological: Negative for dizziness, extremity weakness, gait problem, headaches, light-headedness and seizures.  Hematological: Negative for adenopathy. Does not bruise/bleed easily.  Psychiatric/Behavioral: Negative for confusion, depression and sleep disturbance. The patient is not nervous/anxious.     PHYSICAL EXAMINATION:  Blood pressure 126/65, pulse 97, temperature  97.7 F (36.5 C), temperature source Temporal, resp. rate 18, height 5' 4"  (1.626 m), weight 112 lb (50.8 kg), SpO2 100 %.  ECOG PERFORMANCE STATUS: 1  Physical Exam    Constitutional: Oriented to person, place, and time and thin appearing female and in no distress.  HENT:  Head: Normocephalic and atraumatic.  Mouth/Throat: Oropharynx is clear and moist. No oropharyngeal exudate.  Eyes: Conjunctivae are normal. Right eye exhibits no discharge. Left eye exhibits no discharge. No scleral icterus.  Neck: Normal range of motion. Neck supple.  Cardiovascular: Normal rate, regular rhythm, normal heart sounds and intact distal pulses.   Pulmonary/Chest: Effort normal.  Quiet breath sounds in all lung fields no respiratory distress. No wheezes. No rales.  On 3 L of oxygen via nasal cannula.   Abdominal: Soft. Bowel sounds are normal. Exhibits no distension and no mass. There is no tenderness.  Musculoskeletal: Normal range of motion. Exhibits no edema.  Lymphadenopathy:    No cervical adenopathy.  Neurological: Alert and oriented to person, place, and time. Exhibits normal muscle tone.  Examined in the wheelchair Skin: Skin is warm and dry. No rash noted. Not diaphoretic. No erythema. No pallor.  Psychiatric: Mood, memory and judgment normal.  Vitals reviewed.  LABORATORY DATA: Lab Results  Component Value Date   WBC 5.1 11/15/2019   HGB 11.6 (L) 11/15/2019   HCT 38.3 11/15/2019   MCV 105.5 (H) 11/15/2019   PLT 112 (L) 11/15/2019      Chemistry      Component Value Date/Time   NA 140 11/15/2019 1317   NA 142 07/30/2016 1156   K 3.0 (LL) 11/15/2019 1317   CL 103 11/15/2019 1317   CO2 24 11/15/2019 1317   BUN 23 11/15/2019 1317   BUN 31 (H) 07/30/2016 1156   CREATININE 6.57 (HH) 11/15/2019 1317      Component Value Date/Time   CALCIUM 8.7 (L) 11/15/2019 1317   CALCIUM 5.9 (LL) 03/29/2014 2304   ALKPHOS 82 11/15/2019 1317   AST 16 11/15/2019 1317   ALT 10 11/15/2019 1317   BILITOT 0.6 11/15/2019 1317       RADIOGRAPHIC STUDIES: CT Chest Wo Contrast  Result Date: 10/18/2019 CLINICAL DATA:  Follow-up pulmonary nodule. Intermittent chest pain  and shortness of breath. EXAM: CT CHEST WITHOUT CONTRAST TECHNIQUE: Multidetector CT imaging of the chest was performed following the standard protocol without IV contrast. COMPARISON:  04/14/2019 FINDINGS: Cardiovascular: The heart size is mildly enlarged. Increase caliber of the pulmonary trunk measures 3.4 cm consistent with PA hypertension. Aortic atherosclerosis. RCA and left circumflex coronary artery calcifications. Mediastinum/Nodes: Thyroidectomy. The trachea appears patent and is midline. Normal appearance of the esophagus. No enlarged supraclavicular, axillary, mediastinal, or hilar lymph nodes. Lungs/Pleura: Advanced changes of centrilobular and paraseptal emphysema with diffuse bronchial wall thickening. Central left upper lobe lung nodule measures 1.6 x 2.0 cm, image 69/5. Suspicious for primary bronchogenic carcinoma. More peripherally within the left upper lobe is a new nodule with spiculated margins measuring 0.8 x 1.4 cm, image 46/5. Also worrisome for malignancy. Finally within the posterior right lower lobe there is a new solid nodular density measuring 2.1 x 1.2 cm, image 108/5. Upper Abdomen: Bilateral medullary calcinosis with multiple kidney stones identified. Gastric diverticula is identified measuring 1.8 cm, image 129/2. Aortic atherosclerosis. Musculoskeletal: No chest wall mass or suspicious bone lesions identified. The bones appear diffusely osteopenic. No aggressive lytic or sclerotic bone lesions identified. Inferior endplate lucency involving the T10 vertebra has increased in size  from previous exam measuring 1.3 cm, favor Schmorl's node deformity, image 68/7. IMPRESSION: 1. Central left upper lobe lung nodule is identified measuring 1.6 x 2.0 cm, suspicious for primary bronchogenic carcinoma. Further investigation with PET-CT is advised. 2. There are 2 additional, nonspecific pulmonary nodules identified within the left upper lobe and right lower lobe. These are indeterminate.  Metastatic disease versus synchronous primary lung neoplasms cannot be excluded. Attention to these nodules on PET-CT advised 3. RCA and left circumflex coronary artery calcifications noted. 4. 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 5. Gastric diverticula. Aortic Atherosclerosis (ICD10-I70.0) and Emphysema (ICD10-J43.9). Electronically Signed   By: Kerby Moors M.D.   On: 10/18/2019 16:10   NM PET Image Initial (PI) Skull Base To Thigh  Result Date: 11/01/2019 CLINICAL DATA:  Initial treatment strategy for pulmonary nodule. EXAM: NUCLEAR MEDICINE PET SKULL BASE TO THIGH TECHNIQUE: 5.1 mCi F-18 FDG was injected intravenously. Full-ring PET imaging was performed from the skull base to thigh after the radiotracer. CT data was obtained and used for attenuation correction and anatomic localization. Fasting blood glucose: 74 mg/dl COMPARISON:  CT chest dated 10/18/2019 FINDINGS: Mediastinal blood pool activity: SUV max 2.5 Liver activity: SUV max NA NECK: Misregistered parotid gland activity secondary to head motion. No hypermetabolic cervical lymphadenopathy. Incidental CT findings: none CHEST: 2.3 x 1.9 cm central left upper lobe mass (series 8/image 33), max SUV 16.2, compatible with primary bronchogenic neoplasm. 8 x 13 mm irregular satellite nodule in the left upper lobe (series 8/image 22), max SUV 2.5, indeterminate. This is similar to the prior, but may reflect infection/inflammation given the additional right lower lobe finding. 8 x 17 mm irregular nodular opacity in the posterior right lower lobe (series 8/image 46), previously 12 x 21 mm, max SUV 1.3. This appearance and improvement suggests resolving infection/inflammation. Focal hypermetabolism in the left suprahilar region, max SUV 13.4. Additional hypermetabolism in left hilar region, max SUV 11.2. Incidental CT findings: Atherosclerotic calcifications of the aortic arch. Enlargement of the main pulmonary artery, suggesting  pulmonary arterial hypertension. Mild coronary atherosclerosis of the right coronary artery. Left axillary stent with left arm graft. ABDOMEN/PELVIS: No abnormal hypermetabolism involving the liver, spleen, pancreas, or adrenal glands. No hypermetabolic abdominopelvic lymphadenopathy. Incidental CT findings: Bilateral nonobstructing renal calculi measuring up to 10 mm in the right lower pole (series 4/image 108). Prior cholecystectomy. Posterior gastric diverticulum (series 4/image 87). Atherosclerotic calcifications abdominal aorta and branch vessels. Colonic diverticulosis, without evidence of diverticulitis. Prior hysterectomy. 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 Osseous metastasis at the T11 vertebral body, max SUV 9.1. Incidental CT findings: Degenerative changes involving the visualized thoracolumbar spine. IMPRESSION: 2.3 cm central left upper lobe nodule, compatible with primary bronchogenic neoplasm. Additional right lower lobe nodular opacity is improved, favoring infection/inflammation. Additional left upper lobe nodular opacity is grossly unchanged and indeterminate, but also favors infection/inflammation. Associated left hilar/suprahilar nodal metastases. Osseous metastases at T10-11. Electronically Signed   By: Julian Hy M.D.   On: 11/01/2019 16:59    ASSESSMENT: This is a very pleasant 74 year old African-American female referred to the clinic for evaluation of suspicious stage IV cancer.  She presented with a left upper lobe nodule, left hilar/suprahilar nodal metastases, and metastatic osseous lesions to T10/T11.  She was diagnosed in June 2021.  Pending tissue confirmation.   PLAN: The patient was seen with Dr. Julien Nordmann today.  Dr. Julien Nordmann reviewed the patient's PET scan with her and her husband.  Dr. Julien Nordmann had a lengthy  discussion with the patient about her current condition and recommendations for further work-up.  We will order a brain MRI to  complete the staging work-up.  The patient was instructed to keep her appointment on 11/17/2019 with pulmonology so they can have a more detailed discussion about arranging for biopsy.  We will see the patient back for follow-up visit in 10 to 14 days for evaluation and to have a more detailed discussion about recommended treatment options based on the final pathology.  The patient voices understanding of current disease status and treatment options and is in agreement with the current care plan.  All questions were answered. The patient knows to call the clinic with any problems, questions or concerns. We can certainly see the patient much sooner if necessary.  Thank you so much for allowing me to participate in the care of DAWNA JAKES. I will continue to follow up the patient with you and assist in her care.  The total time spent in the appointment was 70 minutes.  Disclaimer: This note was dictated with voice recognition software. Similar sounding words can inadvertently be transcribed and may not be corrected upon review.   Bernita Beckstrom L Ivalene Platte November 15, 2019, 3:46 PM  ADDENDUM: Hematology/Oncology Attending: I had a face-to-face encounter with the patient today.  I recommended her care plan.  This is a very pleasant 74 years old African-American female who was seen by gastroenterology for evaluation of abdominal pain and she has a history of Crohn's disease.  Dr. Hubbard Hartshorn her gastroenterologist order CT scan of the abdomen pelvis that showed peribronchovascular airspace consolidation in the right lower lobe suspicious for bronchopneumonia or sequela of aspiration.  The patient was referred to Dr. Halford Chessman with pulmonary medicine for of her condition.  CT scan of the chest was performed on 10/18/2019 and showed central left upper lobe lung nodule measuring 1.6 x 2.0 cm suspicious for primary bronchogenic carcinoma.  There are two additional nonspecific pulmonary nodules identified in the  left upper lobe and right lower lobe that are indeterminate.  The patient had a PET scan on 11/01/2019 and it showed 2.3 x 1.9 cm central left upper lobe mass with SUV max of 16.2 compatible with primary bronchogenic neoplasm.  There was 0.8 x 1.3 cm irregular satellite nodule in the left upper lobe that may be infectious or inflammatory in origin.  There was also a 0.8 x 1.7 cm irregular nodular opacity in the posterior right lower lobe again suspicious for inflammatory process.  The scan showed focal hypermetabolism in the left suprahilar region with SUV max of 13.4 and additional hypermetabolism in the left hilar region with SUV max of 11.2.  There was also evidence of osseous metastasis at the T10 vertebral body with SUV max of 14.7.  And osseous metastasis at the T11 vertebral body with SUV max of 9.1. The patient is scheduled for evaluation for bronchoscopy on 11/17/2019. She is here today for evaluation and recommendation regarding her condition. I had a lengthy discussion with the patient and her husband about her condition and further investigation to confirm her diagnosis. I agreed with the proceeding with a bronchoscopy and endobronchial ultrasound and biopsy of the lung mass as well as the hilar lymphadenopathy. We will also complete the staging work-up by ordering MRI of the brain to rule out brain metastasis. We will arrange for the patient to come back for follow-up visit in 2 weeks for evaluation and more detailed discussion of her treatment options based on  the final pathology and staging work-up. The patient is currently on hemodialysis for end-stage renal disease and this will be taken into consideration while considering him for systemic therapy. I gave the patient and her husband the time to ask questions and answered them completely to their satisfaction. The patient was advised to call immediately if she has any concerning symptoms in the interval.  Disclaimer: This note was dictated  with voice recognition software. Similar sounding words can inadvertently be transcribed and may be missed upon review. Eilleen Kempf, MD 11/15/19

## 2019-11-16 ENCOUNTER — Encounter: Payer: Self-pay | Admitting: Cardiovascular Disease

## 2019-11-16 ENCOUNTER — Ambulatory Visit (INDEPENDENT_AMBULATORY_CARE_PROVIDER_SITE_OTHER): Payer: Medicare Other | Admitting: Cardiovascular Disease

## 2019-11-16 ENCOUNTER — Encounter: Payer: Self-pay | Admitting: *Deleted

## 2019-11-16 VITALS — BP 134/52 | HR 104 | Ht 64.0 in | Wt 114.0 lb

## 2019-11-16 DIAGNOSIS — I5032 Chronic diastolic (congestive) heart failure: Secondary | ICD-10-CM | POA: Diagnosis not present

## 2019-11-16 DIAGNOSIS — I119 Hypertensive heart disease without heart failure: Secondary | ICD-10-CM

## 2019-11-16 MED ORDER — METOPROLOL TARTRATE 25 MG PO TABS
25.0000 mg | ORAL_TABLET | Freq: Two times a day (BID) | ORAL | 3 refills | Status: DC
Start: 2019-11-16 — End: 2019-11-20

## 2019-11-16 NOTE — Progress Notes (Signed)
Cardiology Office Note:    Date:  11/16/2019   ID:  RAYNESHA TIEDT, Alferd Apa 1945-06-19, MRN [id number]  PCP:  [name], MD  Cardiologist:   Renwick Asman  Electrophysiologist:  None   Referring MD: [name],*   No chief complaint on file.  Problem list 1.  Chronic diastolic congestive heart failure 2.  COPD 3.  Chronic kidney disease 4.  Aortic insufficiency 5.  Essential hypertension 6.  Chronic anemia 7.  History of DVT/pulmonary embolus.  History of Present Illness:    Natasha [name] is a 74 y.o. female with a hx of  Chronic systolic CHF Was admitted with COVID several months ago  Is now on dialysis  Is now on home O2.   3 liters per minute.   Still eats some salt.    She says that her favorite thing to eat are salty cheese puffs and she eats many of them every day. ( a whole famly sized bag after she comes home from dialysis )   Her LV fuction shows hyperdynamic left ventricular systolic function with an ejection fraction of greater than 65%.  She does have grade 1 diastolic dysfunction.  She has moderate pulmonary hypertension with an estimated PA pressure of 44 mmHg.  November 16, 2019: Natasha [name] is seen today for follow-up of her chronic diastolic congestive heart failure and hypertension.  She also has chronic kidney disease and is on dialysis.. She is feeling better.   Breathing is better.  She was seen in the [place] long emergency room on May 25 for an episode of supraventricular tachycardia.  Her heart rate was 177.  She was treated with metoprolol 2.5 mg IV.  She then received IV adenosine with conversion back to sinus rhythm. Will start metoprolol 25 mg PO BID   She had a lung CT scan and later a PET scan .  cnocerning for cancer She will have a broncholsopy    Past Medical History:  Diagnosis Date  . Anal fistula   . Anemia in chronic kidney disease (CKD)   . Aortic insufficiency    Echo 3/18: mod conc LVH, EF 60-65, no RWMA, Gr 1  DD, mod AI, mild MR, normal RVSF, Trivial TR  . Arthritis   . Borderline hypertension   . Bulging disc    L3-L4  . Chronic diarrhea    due to crohn's  . CKD (chronic kidney disease), stage IV ([place])    MWF- [address]  . Crohn's disease ([place])    chronic ileitis  . Dyspnea    with exertion  . Emphysema/COPD ([place])   . GERD (gastroesophageal reflux disease)    denies  . History of blood transfusion   . History of glaucoma   . History of kidney stones   . History of pancreatitis    2008--  mercaptopurine  . History of small bowel obstruction    12-03-2010  due to crohn's ileitis  . Hypertension    no longer on medications  . Hypothyroidism, postsurgical    multinodule w/ hurthle cells  . Osteoporosis   . Perianal Crohn's disease ([place])   . Peripheral vascular disease ([place])    blood clot behind knee left leg  . Polyarthralgia   . Seizures ([place])    03/2017  . Wears partial dentures    upper    Past Surgical History:  Procedure Laterality Date  . ABDOMINAL HYSTERECTOMY  1990   and  Appendectomy  . AV FISTULA PLACEMENT Left 12/08/2017  Procedure: INSERTION OF ARTERIOVENOUS (AV) GRAFT WITH ARTEGRAFT TO LEFT UPPER ARM;  Surgeon: Conrad Watertown, MD;  Location: [facility];  Service: Vascular;  Laterality: Left;  . BASCILIC VEIN TRANSPOSITION Left 01/09/2017   Procedure: LEFT 1ST STAGE BRACHIAL VEIN TRANSPOSITION;  Surgeon: Conrad [employer], MD;  Location: [facility];  Service: Vascular;  Laterality: Left;  . BASCILIC VEIN TRANSPOSITION Left 06/16/2017   Procedure: Second Stage BASILIC VEIN TRANSPOSITION  LEFT ARM;  Surgeon: Conrad Crosby, MD;  Location: [facility];  Service: Vascular;  Laterality: Left;  . BIOPSY  03/09/2018   Procedure: BIOPSY;  Surgeon: Wilford Corner, MD;  [facility];  Service: Endoscopy;;  . CHOLECYSTECTOMY    . COLON RESECTION  x3 --  1978,  1987, 1989   ILEAL RESECTION x2/   [place]  . COLONOSCOPY WITH PROPOFOL N/A 09/25/2014   Procedure:  COLONOSCOPY WITH PROPOFOL;  Surgeon: Garlan Fair, MD;  [facility];  Service: Endoscopy;  Laterality: N/A;  . COLONOSCOPY WITH PROPOFOL N/A 03/09/2018   Procedure: COLONOSCOPY WITH PROPOFOL;  Surgeon: Wilford Corner, MD;  [facility];  Service: Endoscopy;  Laterality: N/A;  . CYSTOSCOPY W/ URETERAL STENT PLACEMENT Right 11/19/2016   Procedure: CYSTOSCOPY WITH RIGHT RETROGRADE PYELOGRAM/ URETEROSCOPY WITH LASER AND RIGHT URETERAL STENT PLACEMENT;  Surgeon: Ardis Hughs, MD;  [facility];  Service: Urology;  Laterality: Right;  . ESOPHAGOGASTRODUODENOSCOPY  03/04/2012   Procedure: ESOPHAGOGASTRODUODENOSCOPY (EGD);  Surgeon: Garlan Fair, MD;  [facility];  Service: Endoscopy;  Laterality: N/A;  . EXAMINATION UNDER ANESTHESIA N/A 09/12/2014   Procedure: EXAM UNDER ANESTHESIA;  Surgeon: Rolm Bookbinder, MD;  Location: [facility];  Service: General;  Laterality: N/A;  . EYE SURGERY     laser  . FLEXIBLE SIGMOIDOSCOPY  03/04/2012   Procedure: FLEXIBLE SIGMOIDOSCOPY;  Surgeon: Garlan Fair, MD;  [facility];  Service: Endoscopy;  Laterality: N/A;  . GLAUCOMA SURGERY Bilateral   . HOLMIUM LASER APPLICATION Right 1/60/7371   Procedure: HOLMIUM LASER APPLICATION;  Surgeon: Ardis Hughs, MD;  [facility];  Service: Urology;  Laterality: Right;  . INCISION AND DRAINAGE PERIRECTAL ABSCESS N/A 09/12/2014   Procedure: IRRIGATION AND DEBRIDEMENT PERIRECTAL ABSCESS;  Surgeon: Rolm Bookbinder, MD;  Location: [facility];  Service: General;  Laterality: N/A;  . IR FLUORO GUIDE CV LINE RIGHT  03/20/2017  . IR US GUIDE VASC ACCESS RIGHT  03/20/2017  . NEGATIVE SLEEP STUDY  2008  . PLACEMENT OF SETON N/A 12/01/2014   Procedure: PLACEMENT OF SETON;  Surgeon: [name], MD;  [facility];  Service: General;  Laterality: N/A;  . POLYPECTOMY  03/09/2018   Procedure: POLYPECTOMY;  Surgeon: Wilford Corner, MD;   [facility];  Service: Endoscopy;;  . RECTAL EXAM UNDER ANESTHESIA N/A 12/01/2014   Procedure: RECTAL EXAM UNDER ANESTHESIA;  Surgeon: [name], MD;  [facility];  Service: General;  Laterality: N/A;  . TOTAL THYROIDECTOMY  10-13-2003  . TRANSTHORACIC ECHOCARDIOGRAM  07-11-2013   mild LVH,  ef 55%,  moderate AR,  mild MR and TR,  trivial PR  . TUBAL LIGATION      Current Medications: Current Meds  Medication Sig  . acetaminophen (TYLENOL) 500 MG tablet Take 1,000 mg by mouth every 6 (six) hours as needed for mild pain.   . Adalimumab (HUMIRA) 40 MG/0.8ML PSKT Inject 40 mg into the skin every 14 (fourteen) days.   [name] albuterol (VENTOLIN HFA)  108 (90 Base) MCG/ACT inhaler INHALE 2 PUFFS BY MOUTH EVERY 6 HOURS IF NEEDED FOR WHEEZING OR SHORTNESS OF BREATH  . alendronate (FOSAMAX) 70 MG tablet Take 70 mg by mouth every Monday.   . benzonatate (TESSALON) 100 MG capsule Take 100 mg by mouth 3 (three) times daily as needed for cough.   . calcitRIOL (ROCALTROL) 0.25 MCG capsule Take 0.25 mcg by mouth every Monday, Wednesday, and Friday with hemodialysis.  [name] calcium acetate (PHOSLO) 667 MG capsule Take 1 capsule (667 mg total) by mouth 3 (three) times daily with meals.  . calcium-vitamin D (OSCAL WITH D) 500-200 MG-UNIT TABS tablet Take 1 tablet by mouth as needed (with dialysis).   . cyanocobalamin (,VITAMIN B-12,) 1000 MCG/ML injection Inject 1,000 mcg into the muscle every 30 (thirty) days.   [name] ELIQUIS 5 MG TABS tablet Take 2.5 mg by mouth 2 (two) times daily. Takes 1/2 tablet (2.67m) bid  . fluticasone (FLONASE) 50 MCG/ACT nasal spray Place 1-2 sprays into both nostrils daily.  . Fluticasone-Umeclidin-Vilant (TRELEGY ELLIPTA) 100-62.5-25 MCG/INH AEPB Inhale 1 puff into the lungs daily.  . heparin 1000 UNIT/ML injection Inject 1,000 Units into the vein 3 (three) times a week.  .Marland KitchenHYDROcodone-acetaminophen (NORCO) 5-325 MG tablet Take 1 tablet by mouth every 6  (six) hours as needed for moderate pain.  .Marland Kitchenipratropium-albuterol (DUONEB) 0.5-2.5 (3) MG/3ML SOLN Take 3 mLs by nebulization every 6 (six) hours as needed.  .Marland Kitchenlevothyroxine (SYNTHROID, LEVOTHROID) 125 MCG tablet Take 125 mcg by mouth daily before breakfast.   . lidocaine-prilocaine (EMLA) cream Apply 1 application topically every Monday, Wednesday, and Friday.   . loperamide (IMODIUM) 2 MG capsule Take 2 capsules (4 mg total) by mouth as needed for diarrhea or loose stools.  . Methoxy PEG-Epoetin Beta (MIRCERA IJ) Inject 150 mg as directed every 14 (fourteen) days.  . midodrine (PROAMATINE) 5 MG tablet Take 5 mg by mouth every Monday, Wednesday, and Friday.   . nitroGLYCERIN (NITROSTAT) 0.4 MG SL tablet Place 1 tablet (0.4 mg total) under the tongue every 5 (five) minutes as needed for chest pain.  .Marland Kitchenondansetron (ZOFRAN ODT) 4 MG disintegrating tablet Take 1 tablet (4 mg total) by mouth every 8 (eight) hours as needed for nausea or vomiting.  . OXYGEN Inhale 2-3 L into the lungs See admin instructions. Use every night and during the day as needed for shortness of breath  . predniSONE (DELTASONE) 5 MG tablet Take 5 mg by mouth daily with breakfast.  . Respiratory Therapy Supplies (NEBULIZER) DEVI 1 Device by Does not apply route as needed.  . traMADol (ULTRAM) 50 MG tablet Take 50 mg by mouth as needed.  . zinc sulfate 220 (50 Zn) MG capsule Take 1 capsule (220 mg total) by mouth daily.     Allergies:   Mercaptopurine and Remicade [infliximab]   Social History   Socioeconomic History  . Marital status: Married    Spouse name: WTheodoro Doing . Number of children: 3  . Years of education: 13 . Highest education level: Not on file  Occupational History  . Occupation: Retired    EF[employer] farm manager RETIRED  Tobacco Use  . Smoking status: Former Smoker    Packs/day: 1.00    Years: 35.00    Pack years: 35.00    Types: Cigarettes    Start date: 02/22/1967    Quit date: 11/27/2001    Years since quitting:  17.9  . Smokeless tobacco: Never Used  Vaping Use  . Vaping  Use: Never used  Substance and Sexual Activity  . Alcohol use: No  . Drug use: No  . Sexual activity: Never  Other Topics Concern  . Not on file  Social History Narrative   Patient is married [name]) and lives at home with her husband.   Patient has three children.   Patient is retired on disability.   Patient drinks two cups of caffeine daily.   Patient is right-handed.         Social Determinants of Health   Financial Resource Strain:   . Difficulty of Paying Living Expenses:   Food Insecurity:   . Worried About Charity fundraiser in the Last Year:   . Arboriculturist in the Last Year:   Transportation Needs:   . Film/video editor (Medical):   [name] Lack of Transportation (Non-Medical):   Physical Activity:   . Days of Exercise per Week:   . Minutes of Exercise per Session:   Stress:   . Feeling of Stress :   Social Connections:   . Frequency of Communication with Friends and Family:   . Frequency of Social Gatherings with Friends and Family:   . Attends Religious Services:   . Active Member of Clubs or Organizations:   . Attends Archivist Meetings:   [name] Marital Status:      Family History: The patient's family history includes COPD in her mother; Colon cancer in her brother; Diabetes in her brother; Diverticulitis in her mother; Heart attack in her brother, father, and mother; Heart disease in her father and mother; Hypertension in her father and mother; Thyroid disease in her brother.  ROS:   Please see the history of present illness.     All other systems reviewed and are negative.  EKGs/Labs/Other Studies Reviewed:    The following studies were reviewed today:   EKG:    Recent Labs: 04/15/2019: TSH 4.294 04/16/2019: Magnesium 1.6 11/15/2019: ALT 10; BUN 23; Creatinine 6.57; Hemoglobin 11.6; Platelet Count 112; Potassium 3.0; Sodium 140  Recent Lipid Panel No results found for:  CHOL, TRIG, HDL, CHOLHDL, VLDL, LDLCALC, LDLDIRECT  Physical Exam:    Physical Exam: Blood pressure (!) 134/52, pulse (!) 104, height 5' 4"  (1.626 m), weight 114 lb (51.7 kg), SpO2 100 %.  GEN:   Thin,  Elderly female,  NAD , wearing home O2.  HEENT: Normal NECK: No JVD; No carotid bruits LYMPHATICS: No lymphadenopathy CARDIAC: RRR , no murmurs, rubs, gallops RESPIRATORY:  Clear to auscultation without rales, wheezing or rhonchi  ABDOMEN: Soft, non-tender, non-distended MUSCULOSKELETAL:  No edema; No deformity  SKIN: Warm and dry NEUROLOGIC:  Alert and oriented x 3   ASSESSMENT:    No diagnosis found. PLAN:    In order of problems listed above:  1. Chronic diastolic congestive heart failure: . Her heart failure symptoms seem to be improved now that she is decreased her salt intake.  She is on hemodialysis.  2.  SVT:   She recently had an episode of supraventricular tachycardia.  It resolved with IV adenosine.  We will start her on metoprolol 25 mg p.o. twice daily  3.  Hypertension: Blood pressure is much better controlled.  She is stopped eating all of her salty snacks.    Medication Adjustments/Labs and Tests Ordered: Current medicines are reviewed at length with the patient today.  Concerns regarding medicines are outlined above.  No orders of the defined types were placed in this encounter.  Meds ordered  this encounter  Medications  . metoprolol tartrate (LOPRESSOR) 25 MG tablet    Sig: Take 1 tablet (25 mg total) by mouth 2 (two) times daily.    Dispense:  180 tablet    Refill:  3    Patient Instructions  Medication Instructions:  1) START Metoprolol Tartrate 42m twice daily  *If you need a refill on your cardiac medications before your next appointment, please call your pharmacy*   Lab Work: None If you have labs (blood work) drawn today and your tests are completely normal, you will receive your results only by: .Marland KitchenMyChart Message (if you have MyChart)  OR . A paper copy in the mail If you have any lab test that is abnormal or we need to change your treatment, we will call you to review the results.   Testing/Procedures: None   Follow-Up: [facility] you and your health needs are our priority.  As part of our continuing mission to provide you with exceptional heart care, we have created designated Provider Care Teams.  These Care Teams include your primary Cardiologist (physician) and Advanced Practice Providers (APPs -  Physician Assistants and Nurse Practitioners) who all work together to provide you with the care you need, when you need it.  We recommend signing up for the patient portal called "MyChart".  Sign up information is provided on this After Visit Summary.  MyChart is used to connect with patients for Virtual Visits (Telemedicine).  Patients are able to view lab/test results, encounter notes, upcoming appointments, etc.  Non-urgent messages can be sent to your provider as well.   To learn more about what you can do with MyChart, go to [email or web address]    Your next appointment:   6 month(s)  The format for your next appointment:   In Person  Provider:   You may see PMertie Moores MD or one of the following Advanced Practice Providers on your designated Care Team:    SRichardson Dopp PA-C  VRobbie Lis [place]ont   Other Instructions      Signed, PMertie Moores MD  11/16/2019 9:43 AM    [place]

## 2019-11-16 NOTE — Progress Notes (Signed)
Ms. Heinemann is a new patient of Dr. 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  Her treatment plan is MRI brain and referral to pulmonary for tissue dx.  Her MRI brain is set up on 7/8 and will be seen with pulmonary on 11/17/19.  Her schedule is set for now.

## 2019-11-16 NOTE — Patient Instructions (Signed)
Medication Instructions:  1) START Metoprolol Tartrate 68m twice daily  *If you need a refill on your cardiac medications before your next appointment, please call your pharmacy*   Lab Work: None If you have labs (blood work) drawn today and your tests are completely normal, you will receive your results only by:  [place](if you have MyChart) OR  A paper copy in the mail If you have any lab test that is abnormal or we need to change your treatment, we will call you to review the results.   Testing/Procedures: None   Follow-Up: [facility] and your [facility] needs are our [facility].  As part of our continuing mission to provide you with exceptional heart care, we have created designated Provider Care Teams.  These Care Teams include your primary Cardiologist (physician) and Advanced Practice Providers (APPs -  Physician Assistants and Nurse Practitioners) who all work together to provide you with the care you need, when you need it.  We recommend signing up for the patient portal called "MyChart".  Sign up information is provided on this After Visit Summary.  MyChart is used to connect with patients for Virtual Visits (Telemedicine).  Patients are able to view lab/test results, encounter notes, upcoming appointments, etc.  Non-urgent messages can be sent to your provider as well.   To learn more about what you can do with MyChart, go to [email or web address]    Your next appointment:   6 month(s)  The format for your next appointment:   In Person  Provider:   You may see PMertie Moores MD or one of the following Advanced Practice Providers on your designated Care Team:    SRichardson Dopp PA-C  VRobbie Lis [place]   Other Instructions

## 2019-11-17 ENCOUNTER — Other Ambulatory Visit: Payer: Self-pay

## 2019-11-17 ENCOUNTER — Ambulatory Visit (INDEPENDENT_AMBULATORY_CARE_PROVIDER_SITE_OTHER): Payer: Medicare Other | Admitting: Primary Care

## 2019-11-17 VITALS — BP 120/78 | HR 83 | Temp 98.4°F | Ht 64.0 in | Wt 111.8 lb

## 2019-11-17 DIAGNOSIS — J449 Chronic obstructive pulmonary disease, unspecified: Secondary | ICD-10-CM

## 2019-11-17 DIAGNOSIS — C3482 Malignant neoplasm of overlapping sites of left bronchus and lung: Secondary | ICD-10-CM | POA: Diagnosis not present

## 2019-11-17 MED ORDER — TRELEGY ELLIPTA 100-62.5-25 MCG/INH IN AEPB
1.0000 | INHALATION_SPRAY | Freq: Every day | RESPIRATORY_TRACT | 0 refills | Status: DC
Start: 2019-11-17 — End: 2019-11-20

## 2019-11-17 NOTE — Assessment & Plan Note (Signed)
-   Following with Oncology, seen by PA on 6/22 for new consult LUL lung mass consistent with stage IV lung cancer  - PET scan on 11/01/19 showed hypermetabolic LUL mass with SUV 16.2 compatible with primary bronchogenic neoplasm  - MRI brain for complete staging scheduled for July 8th  - Patient would be high risk for complications from bronchoscopy for tissue sampling d/t severe COPD and chronic respiratory failure with hypoxia.  I will discuss with Dr. Halford Chessman and Dr. Valeta Harms to get their input as well.  - FU in 1 week televisit

## 2019-11-17 NOTE — Patient Instructions (Addendum)
I will discuss wether not you are an appropriate candidate for bronchoscopy. As of right now you would be considered HIGH risk for potential complications d/t COPD and chronic respiratory failure.   Follow in 1 week televisit     Flexible Bronchoscopy  Flexible bronchoscopy is a procedure that is used to examine the passageways in the lungs. During the procedure, a thin, flexible tool with a camera on it (bronchoscope) is passed into the mouth or nose, down through the windpipe (trachea), and into the air tubes (bronchi) in the lungs. This tool allows your health care provider to look at your lungs from the inside and take testing (diagnostic) samples if needed. Tell a health care provider about:  Any allergies you have.  All medicines you are taking, including vitamins, herbs, eye drops, creams, and over-the-counter medicines.  Any problems you or family members have had with anesthetic medicines.  Any blood disorders you have.  Any surgeries you have had.  Any medical conditions you have.  Whether you are pregnant or may be pregnant. What are the risks? Generally, this is a safe procedure. However, problems may occur, including:  Infection.  Bleeding.  Damage to other structures or organs.  Allergic reactions to medicines.  Collapsed lung (pneumothorax).  Increased need for oxygen or difficulty breathing after the procedure. What happens before the procedure? Medicines Ask your health care provider about:  Changing or stopping your regular medicines. This is especially important if you are taking diabetes medicines or blood thinners.  Taking medicines such as aspirin and ibuprofen. These medicines can thin your blood. Do not take these medicines before your procedure if your health care provider instructs you not to. You may be given antibiotic medicine to help prevent infection. Staying hydrated Follow instructions from your health care provider about hydration,  which may include:  Up to 2 hours before the procedure - you may continue to drink clear liquids, such as water, clear fruit juice, black coffee, and plain tea. Eating and drinking Follow instructions from your health care provider about eating and drinking, which may include:  8 hours before the procedure - stop eating heavy meals or foods such as meat, fried foods, or fatty foods.  6 hours before the procedure - stop eating light meals or foods, such as toast or cereal.  6 hours before the procedure - stop drinking milk or drinks that contain milk.  2 hours before the procedure - stop drinking clear liquids. General instructions  Plan to have someone take you home from the hospital or clinic.  If you will be going home right after the procedure, plan to have someone with you for 24 hours. What happens during the procedure?  To lower your risk of infection: ? Your health care team will wash or sanitize their hands. ? Your skin will be washed with soap.  An IV tube will be inserted into one of your veins.  You will be given a medicine (local anesthetic) to numb your mouth, nose, throat, and voice box (larynx). You may also be given one or more of the following: ? A medicine to help you relax (sedative). ? A medicine to control coughing. ? A medicine to dry up any fluids in your lungs (secretions).  A bronchoscope will be passed into your nose or mouth, and into your lungs. Your health care provider will examine your lungs.  Samples of airway secretions may be collected for testing.  If abnormal areas are seen in your airways,  tissue samples may be removed for examination under a microscope (biopsy).  If tissue samples are needed from the outer parts of the lung, a type of X-ray (fluoroscopy) may be used to guide the bronchoscope to these areas.  If bleeding occurs, you may be given medicine to stop or decrease the bleeding. The procedure may vary among health care providers  and hospitals. What happens after the procedure?  Do not drive for 24 hours if you were given a sedative.  Your blood pressure, heart rate, breathing rate, and blood oxygen level will be monitored until the medicines you were given have worn off.  You may have a chest X-ray to check for signs of pneumothorax.  You will not be allowed to eat or drink anything for 2 hours after your procedure.  If a biopsy was taken, it is up to you to get the results of your procedure. Ask your health care provider, or the department that is doing the procedure, when your results will be ready. Summary  Flexible bronchoscopy is a procedure that allows your health care provider to look closely at your lungs from the inside and take testing (diagnostic) samples if needed.  Risks of flexible bronchoscopy include bleeding, infection, and pneumothorax.  Before a flexible bronchoscopy, you will be given a medicine (local anesthetic) to numb your mouth, nose, throat, and voice box (larynx). Then, a bronchoscope will be passed into your nose or mouth, and into your lungs.  After the procedure, your blood pressure, heart rate, breathing rate, and blood oxygen level will be monitored until the medicines you were given have worn off. You may have a chest X-ray to check for signs of pneumothorax.  You will not be allowed to eat or drink anything for 2 hours after your procedure. This information is not intended to replace advice given to you by your health care provider. Make sure you discuss any questions you have with your health care provider. Document Revised: 04/24/2017 Document Reviewed: 06/14/2016 Elsevier Patient Education  2020 [employer].

## 2019-11-17 NOTE — Addendum Note (Signed)
Addended by: Satira Sark D on: 11/17/2019 01:40 PM   Modules accepted: Orders

## 2019-11-17 NOTE — Assessment & Plan Note (Addendum)
-   GOLD STAGE III-IV; PFTs in November 2020 showed FEV1 0.57 (35%), ratio 37. - Appears stable, no signs of active exacerbation  - Lungs were clear on exam; O2 97% on 3L pulsed  - Continue Trelegy 100 one puffs daily the morning (sample given)

## 2019-11-17 NOTE — Progress Notes (Signed)
@Patient  ID: Natasha [name], female    DOB: Jun 17, 1945, 74 y.o.   MRN: [id number]  Chief Complaint  Patient presents with  . Follow-up    pt wants to discuss bronchi results    Referring provider: [name]  HPI: 74 year old female, former smoker quit in 2003.  Past medical history significant for malignant neoplasm left lung, COPD/emphysema, hx COVID-19, CHF, left leg DVT/PE end stage renal failure on HD.  Patient of Dr. Halford Chessman, last seen on 10/11/19 for community-acquired pneumonia.  In April completed antibiotic.  She was on higher dose of prednisone as well.   Maintained on Trelegy Ellipta, prednisone 5 mg daily.    Repeat chest x-ray on 10/11/2019 showed nodular opacities left upper lobe and left perihilar region.  Given rapid change from 2 months prior question multifocal pneumonia.  She was ordered for CT chest on 10/18/2019 which showed advanced emphysema, bronchial wall thickening as well as multiple pulmonary nodules right lower lobe.  Ultimately then had PET scan 11/01/2019 which showed a left upper lobe mass measuring 2.3 x 1.9 cm with an SUV of 16.2.  She was referred to [facility].   She was seen by oncology on 11/15/2019 suspicious for stage IV lung cancer. She presented with a left upper lobe nodule, left hilar/suprahilar nodal metastases, and metastatic osseous lesions to T10/T11.  She was diagnosed in June 2021.  Pending tissue confirmation.  She was ordered for MRI to complete staging work-up.  11/17/2019- FU for LUL nodule Presents today to discuss bronchoscopy for tissue sampling to confirm diagnosis. Her breathing is ok when she is wearing oxygen. She is currently on 3L pulsed oxygen. She can go 2-4 hours without her oxygen. She experience dyspnea with moderate exertion. She is on hemodialysis MWF for end stage renal disease. She does have intermittent nausea. She has a hx chron's. Denies chest pain, hemoptysis. MRI brain scheduled for 12/01/19. PFTs in November 2020  showed FEV1 0.57 (35%), ratio 37.    Imaging: PET scan on 11/01/2019 and it showed 2.3 x 1.9 cm central left upper lobe mass with SUV max of 16.2 compatible with primary bronchogenic neoplasm.  There was 0.8 x 1.3 cm irregular satellite nodule in the left upper lobe that may be infectious or inflammatory in origin.  There was also a 0.8 x 1.7 cm irregular nodular opacity in the posterior right lower lobe again suspicious for inflammatory process.  The scan showed focal hypermetabolism in the left suprahilar region with SUV max of 13.4 and additional hypermetabolism in the left hilar region with SUV max of 11.2.  There was also evidence of osseous metastasis at the T10 vertebral body with SUV max of 14.7.  And osseous metastasis at the T11 vertebral body with SUV max of 9.1.   Allergies  Allergen Reactions  . Mercaptopurine Other (See Comments)    Caused pancreatitis  . Remicade [Infliximab] Other (See Comments)    CAUSED JOINT PAIN    Immunization History  Administered Date(s) Administered  . Influenza Split 02/28/2016, 02/23/2017  . Influenza, High Dose Seasonal PF 02/16/2019  . PFIZER SARS-COV-2 Vaccination 06/30/2019, 07/21/2019  . Pneumococcal Conjugate-13 03/26/2017    Past Medical History:  Diagnosis Date  . Anal fistula   . Anemia in chronic kidney disease (CKD)   . Aortic insufficiency    Echo 3/18: mod conc LVH, EF 60-65, no RWMA, Gr 1 DD, mod AI, mild MR, normal RVSF, Trivial TR  . Arthritis   . Borderline hypertension   .  Bulging disc    L3-L4  . Chronic diarrhea    due to crohn's  . CKD (chronic kidney disease), stage IV ([place])    MWF- [address]  . Crohn's disease ([place])    chronic ileitis  . Dyspnea    with exertion  . Emphysema/COPD ([place])   . GERD (gastroesophageal reflux disease)    denies  . History of blood transfusion   . History of glaucoma   . History of kidney stones   . History of pancreatitis    2008--  mercaptopurine  . History of small bowel  obstruction    12-03-2010  due to crohn's ileitis  . Hypertension    no longer on medications  . Hypothyroidism, postsurgical    multinodule w/ hurthle cells  . Osteoporosis   . Perianal Crohn's disease ([place])   . Peripheral vascular disease ([place])    blood clot behind knee left leg  . Polyarthralgia   . Seizures ([place])    03/2017  . Wears partial dentures    upper    Tobacco History: Social History   Tobacco Use  Smoking Status Former Smoker  . Packs/day: 1.00  . Years: 35.00  . Pack years: 35.00  . Types: Cigarettes  . Start date: 02/22/1967  . Quit date: 11/27/2001  . Years since quitting: 17.9  Smokeless Tobacco Never Used   Counseling given: Not Answered   Outpatient Medications Prior to Visit  Medication Sig Dispense Refill  . acetaminophen (TYLENOL) 500 MG tablet Take 1,000 mg by mouth every 6 (six) hours as needed for mild pain.     . Adalimumab (HUMIRA) 40 MG/0.8ML PSKT Inject 40 mg into the skin every 14 (fourteen) days.     [name] albuterol (VENTOLIN HFA) 108 (90 Base) MCG/ACT inhaler INHALE 2 PUFFS BY MOUTH EVERY 6 HOURS IF NEEDED FOR WHEEZING OR SHORTNESS OF BREATH 8.5 g 12  . alendronate (FOSAMAX) 70 MG tablet Take 70 mg by mouth every Monday.   1  . benzonatate (TESSALON) 100 MG capsule Take 100 mg by mouth 3 (three) times daily as needed for cough.     . calcitRIOL (ROCALTROL) 0.25 MCG capsule Take 0.25 mcg by mouth every Monday, Wednesday, and Friday with hemodialysis.  0  . calcium acetate (PHOSLO) 667 MG capsule Take 1 capsule (667 mg total) by mouth 3 (three) times daily with meals. 90 capsule 0  . calcium-vitamin D (OSCAL WITH D) 500-200 MG-UNIT TABS tablet Take 1 tablet by mouth as needed (with dialysis).     . cyanocobalamin (,VITAMIN B-12,) 1000 MCG/ML injection Inject 1,000 mcg into the muscle every 30 (thirty) days.     [name] ELIQUIS 5 MG TABS tablet Take 2.5 mg by mouth 2 (two) times daily. Takes 1/2 tablet (2.38m) bid    . fluticasone (FLONASE) 50 MCG/ACT  nasal spray Place 1-2 sprays into both nostrils daily.    . Fluticasone-Umeclidin-Vilant (TRELEGY ELLIPTA) 100-62.5-25 MCG/INH AEPB Inhale 1 puff into the lungs daily. 60 each 6  . heparin 1000 UNIT/ML injection Inject 1,000 Units into the vein 3 (three) times a week.    .Marland Kitchenipratropium-albuterol (DUONEB) 0.5-2.5 (3) MG/3ML SOLN Take 3 mLs by nebulization every 6 (six) hours as needed. 360 mL 3  . levothyroxine (SYNTHROID, LEVOTHROID) 125 MCG tablet Take 125 mcg by mouth daily before breakfast.     . lidocaine-prilocaine (EMLA) cream Apply 1 application topically every Monday, Wednesday, and Friday.     . loperamide (IMODIUM) 2 MG capsule Take 2  capsules (4 mg total) by mouth as needed for diarrhea or loose stools. 30 capsule 0  . Methoxy PEG-Epoetin Beta (MIRCERA IJ) Inject 150 mg as directed every 14 (fourteen) days.    . metoprolol tartrate (LOPRESSOR) 25 MG tablet Take 1 tablet (25 mg total) by mouth 2 (two) times daily. 180 tablet 3  . midodrine (PROAMATINE) 5 MG tablet Take 5 mg by mouth every Monday, Wednesday, and Friday.   0  . ondansetron (ZOFRAN ODT) 4 MG disintegrating tablet Take 1 tablet (4 mg total) by mouth every 8 (eight) hours as needed for nausea or vomiting. 20 tablet 0  . OXYGEN Inhale 2-3 L into the lungs See admin instructions. Use every night and during the day as needed for shortness of breath    . predniSONE (DELTASONE) 5 MG tablet Take 5 mg by mouth daily with breakfast.    . Respiratory Therapy Supplies (NEBULIZER) DEVI 1 Device by Does not apply route as needed. 1 Device 0  . traMADol (ULTRAM) 50 MG tablet Take 50 mg by mouth as needed.    . zinc sulfate 220 (50 Zn) MG capsule Take 1 capsule (220 mg total) by mouth daily. 30 capsule 0  . nitroGLYCERIN (NITROSTAT) 0.4 MG SL tablet Place 1 tablet (0.4 mg total) under the tongue every 5 (five) minutes as needed for chest pain. 25 tablet 3  . HYDROcodone-acetaminophen (NORCO) 5-325 MG tablet Take 1 tablet by mouth every 6  (six) hours as needed for moderate pain. 15 tablet 0   No facility-administered medications prior to visit.   Review of Systems  Review of Systems  Respiratory: Negative for cough, chest tightness and wheezing.        DOE  Cardiovascular: Negative.    Physical Exam  BP 120/78 (BP Location: Left Arm, Cuff Size: Normal)   Pulse 83   Temp 98.4 F (36.9 C) (Oral)   Ht 5' 4"  (1.626 m)   Wt 111 lb 12.8 oz (50.7 kg)   SpO2 97%   BMI 19.19 kg/m  Physical Exam Constitutional:      Appearance: Normal appearance.  HENT:     Head: Normocephalic and atraumatic.  Pulmonary:     Effort: Pulmonary effort is normal. No respiratory distress.     Breath sounds: No stridor. No wheezing or rhonchi.     Comments: Lungs CTA; 3L POC Neurological:     General: No focal deficit present.     Mental Status: She is alert and oriented to person, place, and time. Mental status is at baseline.  Psychiatric:        Mood and Affect: Mood normal.        Behavior: Behavior normal.        Thought Content: Thought content normal.        Judgment: Judgment normal.      Lab Results:  CBC    Component Value Date/Time   WBC 5.1 11/15/2019 1317   WBC 5.2 10/18/2019 1522   RBC 3.63 (L) 11/15/2019 1317   HGB 11.6 (L) 11/15/2019 1317   HCT 38.3 11/15/2019 1317   PLT 112 (L) 11/15/2019 1317   MCV 105.5 (H) 11/15/2019 1317   MCH 32.0 11/15/2019 1317   MCHC 30.3 11/15/2019 1317   RDW 14.7 11/15/2019 1317   LYMPHSABS 2.2 11/15/2019 1317   MONOABS 0.7 11/15/2019 1317   EOSABS 0.3 11/15/2019 1317   BASOSABS 0.0 11/15/2019 1317    BMET    Component Value Date/Time   NA  140 11/15/2019 1317   NA 142 07/30/2016 1156   K 3.0 (LL) 11/15/2019 1317   CL 103 11/15/2019 1317   CO2 24 11/15/2019 1317   GLUCOSE 75 11/15/2019 1317   BUN 23 11/15/2019 1317   BUN 31 (H) 07/30/2016 1156   CREATININE 6.57 (HH) 11/15/2019 1317   CALCIUM 8.7 (L) 11/15/2019 1317   CALCIUM 5.9 (LL) 03/29/2014 2304   GFRNONAA 6  (L) 11/15/2019 1317   GFRAA 7 (L) 11/15/2019 1317    BNP    Component Value Date/Time   BNP 186.4 (H) 05/28/2018 1333    ProBNP    Component Value Date/Time   PROBNP 272 07/30/2016 1156    Imaging: CT Chest Wo Contrast  Result Date: 10/18/2019 CLINICAL DATA:  Follow-up pulmonary nodule. Intermittent chest pain and shortness of breath. EXAM: CT CHEST WITHOUT CONTRAST TECHNIQUE: Multidetector CT imaging of the chest was performed following the standard protocol without IV contrast. COMPARISON:  04/14/2019 FINDINGS: Cardiovascular: The heart size is mildly enlarged. Increase caliber of the pulmonary trunk measures 3.4 cm consistent with PA hypertension. Aortic atherosclerosis. RCA and left circumflex coronary artery calcifications. Mediastinum/Nodes: Thyroidectomy. The trachea appears patent and is midline. Normal appearance of the esophagus. No enlarged supraclavicular, axillary, mediastinal, or hilar lymph nodes. Lungs/Pleura: Advanced changes of centrilobular and paraseptal emphysema with diffuse bronchial wall thickening. Central left upper lobe lung nodule measures 1.6 x 2.0 cm, image 69/5. Suspicious for primary bronchogenic carcinoma. More peripherally within the left upper lobe is a new nodule with spiculated margins measuring 0.8 x 1.4 cm, image 46/5. Also worrisome for malignancy. Finally within the posterior right lower lobe there is a new solid nodular density measuring 2.1 x 1.2 cm, image 108/5. Upper Abdomen: Bilateral medullary calcinosis with multiple kidney stones identified. Gastric diverticula is identified measuring 1.8 cm, image 129/2. Aortic atherosclerosis. Musculoskeletal: No chest wall mass or suspicious bone lesions identified. The bones appear diffusely osteopenic. No aggressive lytic or sclerotic bone lesions identified. Inferior endplate lucency involving the T10 vertebra has increased in size from previous exam measuring 1.3 cm, favor Schmorl's node deformity, image  68/7. IMPRESSION: 1. Central left upper lobe lung nodule is identified measuring 1.6 x 2.0 cm, suspicious for primary bronchogenic carcinoma. Further investigation with PET-CT is advised. 2. There are 2 additional, nonspecific pulmonary nodules identified within the left upper lobe and right lower lobe. These are indeterminate. Metastatic disease versus synchronous primary lung neoplasms cannot be excluded. Attention to these nodules on PET-CT advised 3. RCA and left circumflex coronary artery calcifications noted. 4. 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 5. Gastric diverticula. Aortic Atherosclerosis (ICD10-I70.0) and Emphysema (ICD10-J43.9). Electronically Signed   By: Kerby Moors M.D.   On: 10/18/2019 16:10   NM PET Image Initial (PI) Skull Base To Thigh  Result Date: 11/01/2019 CLINICAL DATA:  Initial treatment strategy for pulmonary nodule. EXAM: NUCLEAR MEDICINE PET SKULL BASE TO THIGH TECHNIQUE: 5.1 mCi F-18 FDG was injected intravenously. Full-ring PET imaging was performed from the skull base to thigh after the radiotracer. CT data was obtained and used for attenuation correction and anatomic localization. Fasting blood glucose: 74 mg/dl COMPARISON:  CT chest dated 10/18/2019 FINDINGS: Mediastinal blood pool activity: SUV max 2.5 Liver activity: SUV max NA NECK: Misregistered parotid gland activity secondary to head motion. No hypermetabolic cervical lymphadenopathy. Incidental CT findings: none CHEST: 2.3 x 1.9 cm central left upper lobe mass (series 8/image 33), max SUV 16.2, compatible with primary bronchogenic neoplasm. 8 x 13 mm irregular  satellite nodule in the left upper lobe (series 8/image 22), max SUV 2.5, indeterminate. This is similar to the prior, but may reflect infection/inflammation given the additional right lower lobe finding. 8 x 17 mm irregular nodular opacity in the posterior right lower lobe (series 8/image 46), previously 12 x 21 mm, max SUV 1.3. This  appearance and improvement suggests resolving infection/inflammation. Focal hypermetabolism in the left suprahilar region, max SUV 13.4. Additional hypermetabolism in left hilar region, max SUV 11.2. Incidental CT findings: Atherosclerotic calcifications of the aortic arch. Enlargement of the main pulmonary artery, suggesting pulmonary arterial hypertension. Mild coronary atherosclerosis of the right coronary artery. Left axillary stent with left arm graft. ABDOMEN/PELVIS: No abnormal hypermetabolism involving the liver, spleen, pancreas, or adrenal glands. No hypermetabolic abdominopelvic lymphadenopathy. Incidental CT findings: Bilateral nonobstructing renal calculi measuring up to 10 mm in the right lower pole (series 4/image 108). Prior cholecystectomy. Posterior gastric diverticulum (series 4/image 87). Atherosclerotic calcifications abdominal aorta and branch vessels. Colonic diverticulosis, without evidence of diverticulitis. Prior hysterectomy. 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 Osseous metastasis at the T11 vertebral body, max SUV 9.1. Incidental CT findings: Degenerative changes involving the visualized thoracolumbar spine. IMPRESSION: 2.3 cm central left upper lobe nodule, compatible with primary bronchogenic neoplasm. Additional right lower lobe nodular opacity is improved, favoring infection/inflammation. Additional left upper lobe nodular opacity is grossly unchanged and indeterminate, but also favors infection/inflammation. Associated left hilar/suprahilar nodal metastases. Osseous metastases at T10-11. Electronically Signed   By: Julian Hy M.D.   On: 11/01/2019 16:59     Assessment & Plan:   74 year old female, former smoker. 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 11/01/19 PET positive LUL nodule SUV 16 with left hilar/suprahilar nodal metastases and metastatic  osseous lesions to T10/T11. Suspicious of stage IV lung cancer. She saw oncology two days ago and they ordered MRI brain for complete staging and wanted her to see [place] for consideration for bx. She came in today to discuss bronchoscopy for tissue sampling to confirm diagnosis. She has severe COPD and chronic respiratory failure on 3L oxygen continuous. She appears fairly well today, frail/thin woman. No acute distress. Lungs were clear on exam. I do not think she would be a great candidate for bronchoscopy d/t severe COPD/emphysema (FEV1 35%) and chronic respiratory failure on 3L oxygen but will discuss with Dr. Halford Chessman and Dr. Valeta Harms.    Malignant neoplasm of overlapping sites of left lung [facility]) - Following with Oncology, seen by PA on 6/22 for new consult LUL lung mass consistent with stage IV lung cancer  - PET scan on 11/01/19 showed hypermetabolic LUL mass with SUV 16.2 compatible with primary bronchogenic neoplasm  - MRI brain for complete staging scheduled for July 8th  - Patient would be high risk for complications from bronchoscopy for tissue sampling d/t severe COPD and chronic respiratory failure with hypoxia.  I will discuss with Dr. Halford Chessman and Dr. Valeta Harms to get their input as well.  - FU in 1 week televisit   COPD (chronic obstructive pulmonary disease) (HCC) - GOLD STAGE III-IV; PFTs in November 2020 showed FEV1 0.57 (35%), ratio 37. - Appears stable, no signs of active exacerbation  - Lungs were clear on exam; O2 97% on 3L pulsed  - Continue Trelegy 100 one puffs daily the morning (sample given)     Martyn Ehrich, NP 11/17/2019

## 2019-11-18 ENCOUNTER — Inpatient Hospital Stay (HOSPITAL_COMMUNITY)
Admit: 2019-11-18 | Discharge: 2019-11-20 | DRG: 391 | Disposition: A | Discharge status: Home / Self Care | Payer: Medicare Other | Attending: Internal Medicine | Admitting: Internal Medicine

## 2019-11-18 ENCOUNTER — Other Ambulatory Visit: Payer: Self-pay

## 2019-11-18 ENCOUNTER — Other Ambulatory Visit (HOSPITAL_COMMUNITY): Payer: Self-pay | Admitting: Internal Medicine

## 2019-11-18 ENCOUNTER — Encounter (HOSPITAL_COMMUNITY): Payer: Self-pay | Admitting: *Deleted

## 2019-11-18 ENCOUNTER — Emergency Department (HOSPITAL_COMMUNITY): Payer: Medicare Other

## 2019-11-18 DIAGNOSIS — I471 Supraventricular tachycardia: Secondary | ICD-10-CM | POA: Diagnosis present

## 2019-11-18 DIAGNOSIS — I5032 Chronic diastolic (congestive) heart failure: Secondary | ICD-10-CM | POA: Diagnosis not present

## 2019-11-18 DIAGNOSIS — M1990 Unspecified osteoarthritis, unspecified site: Secondary | ICD-10-CM | POA: Diagnosis present

## 2019-11-18 DIAGNOSIS — K5090 Crohn's disease, unspecified, without complications: Secondary | ICD-10-CM | POA: Diagnosis present

## 2019-11-18 DIAGNOSIS — I120 Hypertensive chronic kidney disease with stage 5 chronic kidney disease or end stage renal disease: Secondary | ICD-10-CM | POA: Diagnosis present

## 2019-11-18 DIAGNOSIS — Z8616 Personal history of COVID-19: Secondary | ICD-10-CM

## 2019-11-18 DIAGNOSIS — C3412 Malignant neoplasm of upper lobe, left bronchus or lung: Secondary | ICD-10-CM | POA: Diagnosis present

## 2019-11-18 DIAGNOSIS — K219 Gastro-esophageal reflux disease without esophagitis: Secondary | ICD-10-CM | POA: Diagnosis present

## 2019-11-18 DIAGNOSIS — D696 Thrombocytopenia, unspecified: Secondary | ICD-10-CM | POA: Diagnosis present

## 2019-11-18 DIAGNOSIS — C7802 Secondary malignant neoplasm of left lung: Secondary | ICD-10-CM | POA: Diagnosis present

## 2019-11-18 DIAGNOSIS — R112 Nausea with vomiting, unspecified: Secondary | ICD-10-CM | POA: Diagnosis present

## 2019-11-18 DIAGNOSIS — Z87442 Personal history of urinary calculi: Secondary | ICD-10-CM

## 2019-11-18 DIAGNOSIS — J9611 Chronic respiratory failure with hypoxia: Secondary | ICD-10-CM | POA: Diagnosis not present

## 2019-11-18 DIAGNOSIS — J449 Chronic obstructive pulmonary disease, unspecified: Secondary | ICD-10-CM | POA: Diagnosis not present

## 2019-11-18 DIAGNOSIS — Z79890 Hormone replacement therapy (postmenopausal): Secondary | ICD-10-CM

## 2019-11-18 DIAGNOSIS — D638 Anemia in other chronic diseases classified elsewhere: Secondary | ICD-10-CM | POA: Diagnosis not present

## 2019-11-18 DIAGNOSIS — D539 Nutritional anemia, unspecified: Secondary | ICD-10-CM | POA: Diagnosis present

## 2019-11-18 DIAGNOSIS — Z90710 Acquired absence of both cervix and uterus: Secondary | ICD-10-CM

## 2019-11-18 DIAGNOSIS — Z79899 Other long term (current) drug therapy: Secondary | ICD-10-CM

## 2019-11-18 DIAGNOSIS — Z888 Allergy status to other drugs, medicaments and biological substances status: Secondary | ICD-10-CM

## 2019-11-18 DIAGNOSIS — Z8249 Family history of ischemic heart disease and other diseases of the circulatory system: Secondary | ICD-10-CM

## 2019-11-18 DIAGNOSIS — Z86010 Personal history of colonic polyps: Secondary | ICD-10-CM

## 2019-11-18 DIAGNOSIS — K529 Noninfective gastroenteritis and colitis, unspecified: Secondary | ICD-10-CM | POA: Diagnosis present

## 2019-11-18 DIAGNOSIS — Z7952 Long term (current) use of systemic steroids: Secondary | ICD-10-CM

## 2019-11-18 DIAGNOSIS — K5080 Crohn's disease of both small and large intestine without complications: Secondary | ICD-10-CM | POA: Diagnosis present

## 2019-11-18 DIAGNOSIS — Z9981 Dependence on supplemental oxygen: Secondary | ICD-10-CM

## 2019-11-18 DIAGNOSIS — Z86711 Personal history of pulmonary embolism: Secondary | ICD-10-CM

## 2019-11-18 DIAGNOSIS — I499 Cardiac arrhythmia, unspecified: Secondary | ICD-10-CM | POA: Diagnosis present

## 2019-11-18 DIAGNOSIS — R7989 Other specified abnormal findings of blood chemistry: Secondary | ICD-10-CM | POA: Diagnosis present

## 2019-11-18 DIAGNOSIS — Z7951 Long term (current) use of inhaled steroids: Secondary | ICD-10-CM

## 2019-11-18 DIAGNOSIS — R569 Unspecified convulsions: Secondary | ICD-10-CM | POA: Diagnosis present

## 2019-11-18 DIAGNOSIS — N186 End stage renal disease: Secondary | ICD-10-CM | POA: Diagnosis not present

## 2019-11-18 DIAGNOSIS — D649 Anemia, unspecified: Secondary | ICD-10-CM | POA: Diagnosis present

## 2019-11-18 DIAGNOSIS — C3492 Malignant neoplasm of unspecified part of left bronchus or lung: Secondary | ICD-10-CM

## 2019-11-18 DIAGNOSIS — Z87891 Personal history of nicotine dependence: Secondary | ICD-10-CM

## 2019-11-18 DIAGNOSIS — E162 Hypoglycemia, unspecified: Secondary | ICD-10-CM | POA: Diagnosis present

## 2019-11-18 DIAGNOSIS — R197 Diarrhea, unspecified: Secondary | ICD-10-CM | POA: Diagnosis present

## 2019-11-18 DIAGNOSIS — Z9049 Acquired absence of other specified parts of digestive tract: Secondary | ICD-10-CM

## 2019-11-18 DIAGNOSIS — Z7901 Long term (current) use of anticoagulants: Secondary | ICD-10-CM

## 2019-11-18 DIAGNOSIS — I480 Paroxysmal atrial fibrillation: Secondary | ICD-10-CM | POA: Diagnosis present

## 2019-11-18 DIAGNOSIS — C771 Secondary and unspecified malignant neoplasm of intrathoracic lymph nodes: Secondary | ICD-10-CM | POA: Diagnosis present

## 2019-11-18 DIAGNOSIS — I959 Hypotension, unspecified: Secondary | ICD-10-CM | POA: Diagnosis present

## 2019-11-18 DIAGNOSIS — E890 Postprocedural hypothyroidism: Secondary | ICD-10-CM | POA: Diagnosis present

## 2019-11-18 DIAGNOSIS — H409 Unspecified glaucoma: Secondary | ICD-10-CM | POA: Diagnosis present

## 2019-11-18 DIAGNOSIS — C3482 Malignant neoplasm of overlapping sites of left bronchus and lung: Secondary | ICD-10-CM

## 2019-11-18 DIAGNOSIS — R1013 Epigastric pain: Secondary | ICD-10-CM | POA: Diagnosis not present

## 2019-11-18 DIAGNOSIS — E039 Hypothyroidism, unspecified: Secondary | ICD-10-CM | POA: Diagnosis present

## 2019-11-18 DIAGNOSIS — E8889 Other specified metabolic disorders: Secondary | ICD-10-CM | POA: Diagnosis present

## 2019-11-18 DIAGNOSIS — Z8349 Family history of other endocrine, nutritional and metabolic diseases: Secondary | ICD-10-CM

## 2019-11-18 DIAGNOSIS — Z992 Dependence on renal dialysis: Secondary | ICD-10-CM

## 2019-11-18 DIAGNOSIS — D631 Anemia in chronic kidney disease: Secondary | ICD-10-CM | POA: Diagnosis present

## 2019-11-18 DIAGNOSIS — I739 Peripheral vascular disease, unspecified: Secondary | ICD-10-CM | POA: Diagnosis present

## 2019-11-18 DIAGNOSIS — C7951 Secondary malignant neoplasm of bone: Secondary | ICD-10-CM | POA: Diagnosis present

## 2019-11-18 DIAGNOSIS — R778 Other specified abnormalities of plasma proteins: Secondary | ICD-10-CM | POA: Diagnosis present

## 2019-11-18 DIAGNOSIS — J439 Emphysema, unspecified: Secondary | ICD-10-CM | POA: Diagnosis present

## 2019-11-18 DIAGNOSIS — I351 Nonrheumatic aortic (valve) insufficiency: Secondary | ICD-10-CM | POA: Diagnosis present

## 2019-11-18 DIAGNOSIS — E876 Hypokalemia: Secondary | ICD-10-CM | POA: Diagnosis present

## 2019-11-18 DIAGNOSIS — Z20822 Contact with and (suspected) exposure to covid-19: Secondary | ICD-10-CM | POA: Diagnosis present

## 2019-11-18 DIAGNOSIS — M810 Age-related osteoporosis without current pathological fracture: Secondary | ICD-10-CM | POA: Diagnosis present

## 2019-11-18 DIAGNOSIS — Z825 Family history of asthma and other chronic lower respiratory diseases: Secondary | ICD-10-CM

## 2019-11-18 HISTORY — DX: Paroxysmal atrial fibrillation: I48.0

## 2019-11-18 LAB — CBG MONITORING, ED
Glucose-Capillary: 58 mg/dL — ABNORMAL LOW (ref 70–99)
Glucose-Capillary: 55 mg/dL — ABNORMAL LOW (ref 70–99)
Glucose-Capillary: 143 mg/dL — ABNORMAL HIGH (ref 70–99)

## 2019-11-18 LAB — TROPONIN I (HIGH SENSITIVITY)
Troponin I (High Sensitivity): 46 ng/L — ABNORMAL HIGH (ref <18)
Troponin I (High Sensitivity): 95 ng/L — ABNORMAL HIGH (ref <18)

## 2019-11-18 LAB — URINALYSIS, ROUTINE W REFLEX MICROSCOPIC
Bacteria, UA: NONE SEEN
Bilirubin Urine: NEGATIVE
Glucose, UA: 50 mg/dL — AB
Ketones, ur: NEGATIVE mg/dL
Leukocytes,Ua: NEGATIVE
Nitrite: NEGATIVE
Protein, ur: 30 mg/dL — AB
Specific Gravity, Urine: 1.019 (ref 1.005–1.030)
pH: 6 (ref 5.0–8.0)

## 2019-11-18 LAB — HEMOGLOBIN A1C
Hgb A1c MFr Bld: 5 % (ref 4.8–5.6)
Mean Plasma Glucose: 96.8 mg/dL

## 2019-11-18 LAB — COMPREHENSIVE METABOLIC PANEL
ALT: 11 U/L (ref 0–44)
AST: 18 U/L (ref 15–41)
Albumin: 2.6 g/dL — ABNORMAL LOW (ref 3.5–5.0)
Alkaline Phosphatase: 60 U/L (ref 38–126)
Anion gap: 13 (ref 5–15)
BUN: 36 mg/dL — ABNORMAL HIGH (ref 8–23)
CO2: 20 mmol/L — ABNORMAL LOW (ref 22–32)
Calcium: 8.3 mg/dL — ABNORMAL LOW (ref 8.9–10.3)
Chloride: 105 mmol/L (ref 98–111)
Creatinine, Ser: 7.98 mg/dL — ABNORMAL HIGH (ref 0.44–1.00)
GFR calc Af Amer: 5 mL/min — ABNORMAL LOW (ref >60)
GFR calc non Af Amer: 5 mL/min — ABNORMAL LOW (ref >60)
Glucose, Bld: 194 mg/dL — ABNORMAL HIGH (ref 70–99)
Potassium: 3.2 mmol/L — ABNORMAL LOW (ref 3.5–5.1)
Sodium: 138 mmol/L (ref 135–145)
Total Bilirubin: 0.2 mg/dL — ABNORMAL LOW (ref 0.3–1.2)
Total Protein: 5.6 g/dL — ABNORMAL LOW (ref 6.5–8.1)

## 2019-11-18 LAB — CBC
HCT: 36.5 % (ref 36.0–46.0)
Hemoglobin: 10.6 g/dL — ABNORMAL LOW (ref 12.0–15.0)
MCH: 31.5 pg (ref 26.0–34.0)
MCHC: 29 g/dL — ABNORMAL LOW (ref 30.0–36.0)
MCV: 108.6 fL — ABNORMAL HIGH (ref 80.0–100.0)
Platelets: 102 10*3/uL — ABNORMAL LOW (ref 150–400)
RBC: 3.36 MIL/uL — ABNORMAL LOW (ref 3.87–5.11)
RDW: 14.6 % (ref 11.5–15.5)
WBC: 4.2 10*3/uL (ref 4.0–10.5)
nRBC: 0 % (ref 0.0–0.2)

## 2019-11-18 LAB — LACTIC ACID, PLASMA
Lactic Acid, Venous: 1.6 mmol/L (ref 0.5–1.9)
Lactic Acid, Venous: 0.8 mmol/L (ref 0.5–1.9)

## 2019-11-18 LAB — SARS CORONAVIRUS 2 BY RT PCR (HOSPITAL ORDER, PERFORMED IN ~~LOC~~ HOSPITAL LAB): SARS Coronavirus 2: NEGATIVE

## 2019-11-18 LAB — LIPASE, BLOOD: Lipase: 56 U/L — ABNORMAL HIGH (ref 11–51)

## 2019-11-18 LAB — TSH: TSH: 1.499 u[IU]/mL (ref 0.350–4.500)

## 2019-11-18 LAB — GLUCOSE, CAPILLARY
Glucose-Capillary: 107 mg/dL — ABNORMAL HIGH (ref 70–99)
Glucose-Capillary: 87 mg/dL (ref 70–99)

## 2019-11-18 MED ORDER — FENTANYL CITRATE (PF) 100 MCG/2ML IJ SOLN
25.0000 ug | Freq: Once | INTRAMUSCULAR | Status: AC
  Administered 2019-11-18: 25 ug via INTRAVENOUS
  Filled 2019-11-18: qty 2

## 2019-11-18 MED ORDER — POTASSIUM CHLORIDE CRYS ER 20 MEQ PO TBCR
30.0000 meq | EXTENDED_RELEASE_TABLET | ORAL | Status: AC
  Administered 2019-11-18: 30 meq via ORAL
  Filled 2019-11-18: qty 1

## 2019-11-18 MED ORDER — PANTOPRAZOLE SODIUM 40 MG IV SOLR
40.0000 mg | Freq: Two times a day (BID) | INTRAVENOUS | Status: DC
  Administered 2019-11-18 – 2019-11-20 (×4): 40 mg via INTRAVENOUS
  Filled 2019-11-18 (×4): qty 40

## 2019-11-18 MED ORDER — CALCITRIOL 0.25 MCG PO CAPS: 0.2500 ug | ORAL_CAPSULE | ORAL | Status: DC

## 2019-11-18 MED ORDER — DEXTROSE 50 % IV SOLN
1.0000 | Freq: Once | INTRAVENOUS | Status: AC
  Administered 2019-11-18: 50 mL via INTRAVENOUS
  Filled 2019-11-18: qty 50

## 2019-11-18 MED ORDER — CALCIUM ACETATE (PHOS BINDER) 667 MG PO CAPS
667.0000 mg | ORAL_CAPSULE | Freq: Three times a day (TID) | ORAL | Status: DC
  Administered 2019-11-18 – 2019-11-20 (×6): 667 mg via ORAL
  Filled 2019-11-18 (×6): qty 1

## 2019-11-18 MED ORDER — DEXTROSE 50 % IV SOLN
INTRAVENOUS | Status: AC
  Administered 2019-11-18: 50 mL via INTRAVENOUS
  Filled 2019-11-18: qty 50

## 2019-11-18 MED ORDER — ACETAMINOPHEN 650 MG RE SUPP: 650.0000 mg | Freq: Four times a day (QID) | RECTAL | Status: DC | PRN

## 2019-11-18 MED ORDER — POTASSIUM CHLORIDE CRYS ER 20 MEQ PO TBCR: 40.0000 meq | EXTENDED_RELEASE_TABLET | ORAL | Status: DC

## 2019-11-18 MED ORDER — FLUTICASONE FUROATE-VILANTEROL 100-25 MCG/INH IN AEPB
1.0000 | INHALATION_SPRAY | Freq: Every day | RESPIRATORY_TRACT | Status: DC
  Administered 2019-11-19 – 2019-11-20 (×2): 1 via RESPIRATORY_TRACT
  Filled 2019-11-18: qty 28

## 2019-11-18 MED ORDER — LEVOTHYROXINE SODIUM 25 MCG PO TABS
125.0000 ug | ORAL_TABLET | Freq: Every day | ORAL | Status: DC
  Administered 2019-11-19 – 2019-11-20 (×2): 125 ug via ORAL
  Filled 2019-11-18 (×2): qty 1

## 2019-11-18 MED ORDER — DEXTROSE 50 % IV SOLN: 1.0000 | Freq: Once | INTRAVENOUS | Status: AC

## 2019-11-18 MED ORDER — ACETAMINOPHEN 325 MG PO TABS
650.0000 mg | ORAL_TABLET | Freq: Four times a day (QID) | ORAL | Status: DC | PRN
  Administered 2019-11-19 (×2): 650 mg via ORAL
  Filled 2019-11-18 (×2): qty 2

## 2019-11-18 MED ORDER — FLUTICASONE-UMECLIDIN-VILANT 100-62.5-25 MCG/INH IN AEPB: 1.0000 | INHALATION_SPRAY | Freq: Every day | RESPIRATORY_TRACT | Status: DC

## 2019-11-18 MED ORDER — IOHEXOL 350 MG/ML SOLN
100.0000 mL | Freq: Once | INTRAVENOUS | Status: AC | PRN
  Administered 2019-11-18: 100 mL via INTRAVENOUS

## 2019-11-18 MED ORDER — SODIUM CHLORIDE 0.9 % IV BOLUS
500.0000 mL | Freq: Once | INTRAVENOUS | Status: AC
  Administered 2019-11-18: 500 mL via INTRAVENOUS

## 2019-11-18 MED ORDER — UMECLIDINIUM BROMIDE 62.5 MCG/INH IN AEPB
1.0000 | INHALATION_SPRAY | Freq: Every day | RESPIRATORY_TRACT | Status: DC
  Administered 2019-11-19 – 2019-11-20 (×2): 1 via RESPIRATORY_TRACT
  Filled 2019-11-18: qty 7

## 2019-11-18 MED ORDER — LIDOCAINE-PRILOCAINE 2.5-2.5 % EX CREA
1.0000 "application " | TOPICAL_CREAM | CUTANEOUS | Status: DC
  Filled 2019-11-18: qty 5

## 2019-11-18 MED ORDER — CYANOCOBALAMIN 1000 MCG/ML IJ SOLN: 1000.0000 ug | INTRAMUSCULAR | Status: DC

## 2019-11-18 MED ORDER — ONDANSETRON HCL 4 MG PO TABS: 4.0000 mg | ORAL_TABLET | Freq: Four times a day (QID) | ORAL | Status: DC | PRN

## 2019-11-18 MED ORDER — PREDNISONE 5 MG PO TABS
5.0000 mg | ORAL_TABLET | Freq: Every day | ORAL | Status: DC
  Administered 2019-11-19: 5 mg via ORAL
  Filled 2019-11-18: qty 1

## 2019-11-18 MED ORDER — DEXTROSE 10 % IV SOLN: INTRAVENOUS | Status: DC

## 2019-11-18 MED ORDER — ONDANSETRON HCL 4 MG/2ML IJ SOLN: 4.0000 mg | Freq: Four times a day (QID) | INTRAMUSCULAR | Status: DC | PRN

## 2019-11-18 MED ORDER — DILTIAZEM HCL 30 MG PO TABS
30.0000 mg | ORAL_TABLET | Freq: Three times a day (TID) | ORAL | Status: DC
  Administered 2019-11-18 – 2019-11-20 (×4): 30 mg via ORAL
  Filled 2019-11-18 (×5): qty 1

## 2019-11-18 MED ORDER — CHLORHEXIDINE GLUCONATE CLOTH 2 % EX PADS
6.0000 | MEDICATED_PAD | Freq: Every day | CUTANEOUS | Status: DC
  Administered 2019-11-19 – 2019-11-20 (×2): 6 via TOPICAL

## 2019-11-18 MED ORDER — MIDODRINE HCL 5 MG PO TABS
5.0000 mg | ORAL_TABLET | ORAL | Status: DC
  Administered 2019-11-18 – 2019-11-19 (×2): 5 mg via ORAL
  Filled 2019-11-18 (×2): qty 1

## 2019-11-18 MED ORDER — FLUTICASONE PROPIONATE 50 MCG/ACT NA SUSP
1.0000 | Freq: Every day | NASAL | Status: DC
  Administered 2019-11-19 – 2019-11-20 (×2): 1 via NASAL
  Filled 2019-11-18: qty 16

## 2019-11-18 MED ORDER — DEXTROSE 50 % IV SOLN: 1.0000 | Freq: Once | INTRAVENOUS | Status: DC

## 2019-11-18 MED ORDER — ONDANSETRON HCL 4 MG/2ML IJ SOLN
4.0000 mg | Freq: Once | INTRAMUSCULAR | Status: AC
  Administered 2019-11-18: 4 mg via INTRAVENOUS
  Filled 2019-11-18: qty 2

## 2019-11-18 MED ORDER — ALUM & MAG HYDROXIDE-SIMETH 200-200-20 MG/5ML PO SUSP
30.0000 mL | Freq: Four times a day (QID) | ORAL | Status: DC | PRN
  Filled 2019-11-18: qty 30

## 2019-11-18 MED ORDER — LOPERAMIDE HCL 2 MG PO CAPS: 4.0000 mg | ORAL_CAPSULE | ORAL | Status: DC | PRN

## 2019-11-18 MED ORDER — APIXABAN 2.5 MG PO TABS
2.5000 mg | ORAL_TABLET | Freq: Two times a day (BID) | ORAL | Status: DC
  Administered 2019-11-18 – 2019-11-20 (×5): 2.5 mg via ORAL
  Filled 2019-11-18 (×7): qty 1

## 2019-11-18 MED ORDER — ALUM & MAG HYDROXIDE-SIMETH 200-200-20 MG/5ML PO SUSP
30.0000 mL | Freq: Once | ORAL | Status: AC
  Administered 2019-11-18: 30 mL via ORAL
  Filled 2019-11-18: qty 30

## 2019-11-18 NOTE — ED Notes (Signed)
MD notified of cbg of 72.  Dextrose push ordered.

## 2019-11-18 NOTE — Progress Notes (Signed)
Cardiology Consultation:   Patient ID: [name] MRN: [id number]; DOB: Jan 28, 1946  Admit date: 11/18/2019 Date of Consult: 11/18/2019  Primary Care Provider: [name], MD [facility] Cardiologist: Mertie Moores, MD  [facility] Electrophysiologist:  None    Patient Profile:   Natasha [name] is a 74 y.o. 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 Tamala Julian.  History of Present Illness:   Natasha [name] has recently been evaluated in clinic by Dr. Acie Fredrickson on 11/15/2019, please also refer to his notes.  On 10/18/2019 she was seen in the was along the emergency room with SVT.  The EKG tracing is highly suggestive of AV node reentry tachycardia, roughly 180 bpm.  Intravenous metoprolol did not have much impact on the tachycardia, but intravenous adenosine led to conversion to normal rhythm.  She was weak and lightheaded and had palpitations, but never experienced syncope.  Metoprolol 25 mg twice daily was then started, but she has only taken 2 doses before the events of today.  Today she had nausea, epigastric discomfort, diarrhea and dizziness.  After vomiting she noticed that her heart rate suddenly picked up.  She felt lightheaded, but denies experiencing syncope.  EMS documented a systolic blood pressure in the 70s and she was better after intravenous fluids were given.  She was again found to have SVT.  Unfortunately, the tracing is currently not available for review.  Intravenous diltiazem was administered and the rhythm converted to normal sinus rhythm.  Work-up in the emergency room did not show any clear cause for her GI complaints.  The cardiac high-sensitivity troponin I was minimally elevated  and remained in a plateau pattern.     Past Medical History:  Diagnosis Date  . Anal fistula   . Anemia in chronic kidney disease (CKD)   . Aortic insufficiency    Echo 3/18: mod conc LVH, EF 60-65, no RWMA, Gr 1 DD, mod AI, mild MR, normal RVSF, Trivial TR  . Arthritis   . Borderline hypertension   . Bulging disc    L3-L4  . Chronic diarrhea    due to crohn's  . CKD (chronic kidney disease), stage IV ([place])    MWF- [address]  . Crohn's disease ([place])    chronic ileitis  . Dyspnea    with exertion  . Emphysema/COPD ([place])   . GERD (gastroesophageal reflux disease)    denies  . History of blood transfusion   . History of glaucoma   . History of kidney stones   . History of pancreatitis    2008--  mercaptopurine  . History of small bowel obstruction    12-03-2010  due to crohn's ileitis  . Hypertension    no longer on medications  . Hypothyroidism, postsurgical    multinodule w/ hurthle cells  . Osteoporosis   . PAF (paroxysmal atrial fibrillation) ([place])   . Perianal Crohn's disease ([place])   . Peripheral vascular disease ([place])    blood clot behind knee left leg  . Polyarthralgia   . Seizures ([place])    03/2017  . Wears partial dentures    upper    Past Surgical History:  Procedure Laterality Date  . ABDOMINAL HYSTERECTOMY  1990   and  Appendectomy  .  AV FISTULA PLACEMENT Left 12/08/2017   Procedure: INSERTION OF ARTERIOVENOUS (AV) GRAFT WITH ARTEGRAFT TO LEFT UPPER ARM;  Surgeon: Conrad Oelrichs, MD;  Location: [place];  Service: Vascular;  Laterality: Left;  . BASCILIC VEIN TRANSPOSITION Left 01/09/2017   Procedure: LEFT 1ST STAGE BRACHIAL VEIN TRANSPOSITION;  Surgeon: Conrad Hardy, MD;  Location: [place];  Service: Vascular;  Laterality: Left;  . BASCILIC VEIN TRANSPOSITION Left 06/16/2017   Procedure: Second Stage BASILIC VEIN TRANSPOSITION  LEFT ARM;  Surgeon: Conrad Northbrook, MD;  Location: [place];  Service: Vascular;  Laterality: Left;  . BIOPSY  03/09/2018    Procedure: BIOPSY;  Surgeon: Wilford Corner, MD;  Location: [facility];  Service: Endoscopy;;  . CHOLECYSTECTOMY    . COLON RESECTION  x3 --  1978,  1987, 1989   ILEAL RESECTION x2/   [place]  . COLONOSCOPY WITH PROPOFOL N/A 09/25/2014   Procedure: COLONOSCOPY WITH PROPOFOL;  Surgeon: Garlan Fair, MD;  [facility];  Service: Endoscopy;  Laterality: N/A;  . COLONOSCOPY WITH PROPOFOL N/A 03/09/2018   Procedure: COLONOSCOPY WITH PROPOFOL;  Surgeon: Wilford Corner, MD;  [facility];  Service: Endoscopy;  Laterality: N/A;  . CYSTOSCOPY W/ URETERAL STENT PLACEMENT Right 11/19/2016   Procedure: CYSTOSCOPY WITH RIGHT RETROGRADE PYELOGRAM/ URETEROSCOPY WITH LASER AND RIGHT URETERAL STENT PLACEMENT;  Surgeon: Ardis Hughs, MD;  [facility];  Service: Urology;  Laterality: Right;  . ESOPHAGOGASTRODUODENOSCOPY  03/04/2012   Procedure: ESOPHAGOGASTRODUODENOSCOPY (EGD);  Surgeon: Garlan Fair, MD;  Location[facility];  Service: Endoscopy;  Laterality: N/A;  . EXAMINATION UNDER ANESTHESIA N/A 09/12/2014   Procedure: EXAM UNDER ANESTHESIA;  Surgeon: Rolm Bookbinder, MD;  Location: [place];  Service: General;  Laterality: N/A;  . EYE SURGERY     laser  . FLEXIBLE SIGMOIDOSCOPY  03/04/2012   Procedure: FLEXIBLE SIGMOIDOSCOPY;  Surgeon: Garlan Fair, MD;  [facility];  Service: Endoscopy;  Laterality: N/A;  . GLAUCOMA SURGERY Bilateral   . HOLMIUM LASER APPLICATION Right 9/92/4268   Procedure: HOLMIUM LASER APPLICATION;  Surgeon: Ardis Hughs, MD;  [facility];  Service: Urology;  Laterality: Right;  . INCISION AND DRAINAGE PERIRECTAL ABSCESS N/A 09/12/2014   Procedure: IRRIGATION AND DEBRIDEMENT PERIRECTAL ABSCESS;  Surgeon: Rolm Bookbinder, MD;  Location: [place];  Service: General;  Laterality: N/A;  . IR FLUORO GUIDE CV LINE RIGHT  03/20/2017  . IR US GUIDE VASC ACCESS RIGHT  03/20/2017  . NEGATIVE SLEEP STUDY  2008    . PLACEMENT OF SETON N/A 12/01/2014   Procedure: PLACEMENT OF SETON;  Surgeon: [name], MD;  Location: [facility];  Service: General;  Laterality: N/A;  . POLYPECTOMY  03/09/2018   Procedure: POLYPECTOMY;  Surgeon: Wilford Corner, MD;  [facility];  Service: Endoscopy;;  . RECTAL EXAM UNDER ANESTHESIA N/A 12/01/2014   Procedure: RECTAL EXAM UNDER ANESTHESIA;  Surgeon: [name], MD;  Location: [facility];  Service: General;  Laterality: N/A;  . TOTAL THYROIDECTOMY  10-13-2003  . TRANSTHORACIC ECHOCARDIOGRAM  07-11-2013   mild LVH,  ef 55%,  moderate AR,  mild MR and TR,  trivial PR  . TUBAL LIGATION       Home Medications:  Prior to Admission medications   Medication Sig Start Date End Date Taking? Authorizing Provider  acetaminophen (TYLENOL) 500 MG tablet Take 1,000 mg by mouth every 6 (six) hours as needed for mild pain.    Yes [provider]  Adalimumab (HUMIRA) 40 MG/0.8ML PSKT Inject 40 mg into the skin every 14 (fourteen) days.    Yes [provider]  albuterol (VENTOLIN HFA) 108 (90 Base) MCG/ACT inhaler INHALE 2 PUFFS BY MOUTH EVERY 6 HOURS IF NEEDED FOR WHEEZING OR SHORTNESS OF BREATH 05/31/19  Yes Chesley Mires, MD  alendronate (FOSAMAX) 70 MG tablet Take 70 mg by mouth every Monday.  11/06/16  Yes [provider]  calcitRIOL (ROCALTROL) 0.25 MCG capsule Take 0.25 mcg by mouth every Monday, Wednesday, and Friday with hemodialysis. 02/03/17  Yes [provider]  calcium acetate (PHOSLO) 667 MG capsule Take 1 capsule (667 mg total) by mouth 3 (three) times daily with meals. 06/06/18  Yes Domenic Polite, MD  calcium-vitamin D (OSCAL WITH D) 500-200 MG-UNIT TABS tablet Take 1 tablet by mouth as needed (with dialysis).    Yes [provider]  cyanocobalamin (,VITAMIN B-12,) 1000 MCG/ML injection Inject 1,000 mcg into the muscle every 30 (thirty) days.    Yes [provider]  ELIQUIS 5  MG TABS tablet Take 2.5 mg by mouth 2 (two) times daily. Takes 1/2 tablet (2.38m) bid 08/23/18  Yes [provider]  fluticasone (FLONASE) 50 MCG/ACT nasal spray Place 1-2 sprays into both nostrils daily. 03/19/19  Yes [provider]  Fluticasone-Umeclidin-Vilant (TRELEGY ELLIPTA) 100-62.5-25 MCG/INH AEPB Inhale 1 puff into the lungs daily. 07/25/19  Yes SChesley Mires MD  heparin 1000 UNIT/ML injection Inject 1,000 Units into the vein 3 (three) times a week. 12/01/18 11/30/19 Yes [provider]  ipratropium-albuterol (DUONEB) 0.5-2.5 (3) MG/3ML SOLN Take 3 mLs by nebulization every 6 (six) hours as needed. 12/30/18  Yes NFenton Foy NP  levothyroxine (SYNTHROID, LEVOTHROID) 125 MCG tablet Take 125 mcg by mouth daily before breakfast.    Yes [provider]  lidocaine-prilocaine (EMLA) cream Apply 1 application topically every Monday, Wednesday, and Friday.  11/29/18  Yes [provider]  loperamide (IMODIUM) 2 MG capsule Take 2 capsules (4 mg total) by mouth as needed for diarrhea or loose stools. 03/27/17  Yes Gherghe, CVella Redhead MD  Methoxy PEG-Epoetin Beta (MIRCERA IJ) Inject 150 mg as directed every 14 (fourteen) days. 06/13/19 06/11/20 Yes [provider]  metoprolol tartrate (LOPRESSOR) 25 MG tablet Take 1 tablet (25 mg total) by mouth 2 (two) times daily. 11/16/19 02/14/20 Yes Nahser, PWonda Cheng MD  midodrine (PROAMATINE) 5 MG tablet Take 5 mg by mouth every Monday, Wednesday, and Friday.  04/30/17  Yes [provider]  nitroGLYCERIN (NITROSTAT) 0.4 MG SL tablet Place 1 tablet (0.4 mg total) under the tongue every 5 (five) minutes as needed for chest pain. 05/03/19 11/18/19 Yes Nahser, PWonda Cheng MD  ondansetron (ZOFRAN ODT) 4 MG disintegrating tablet Take 1 tablet (4 mg total) by mouth every 8 (eight) hours as needed for nausea or vomiting. 03/29/18  Yes FDeno Etienne DO  OXYGEN Inhale 2-3 L into the lungs See admin instructions. Use every night and  during the day as needed for shortness of breath   Yes [provider]  predniSONE (DELTASONE) 5 MG tablet Take 5 mg by mouth daily with breakfast.   Yes [provider]  Fluticasone-Umeclidin-Vilant (TRELEGY ELLIPTA) 100-62.5-25 MCG/INH AEPB Inhale 1 puff into the lungs daily. Patient not taking: Reported on 11/18/2019 11/17/19   WMartyn Ehrich NP  Respiratory Therapy Supplies (NEBULIZER) DEVI 1 Device by Does not apply route as needed. 12/07/18   NFenton Foy NP    Inpatient Medications:  Scheduled Meds: . apixaban  2.5 mg Oral BID  . [START ON 11/21/2019] calcitRIOL  0.25 mcg Oral Q M,W,F-HD  . calcium acetate  667 mg Oral TID WC  . [START ON 11/19/2019] Chlorhexidine Gluconate Cloth  6 each Topical Q0600  . fluticasone  1-2 spray Each Nare Daily  . fluticasone furoate-vilanterol  1 puff Inhalation Daily  . [START ON 11/19/2019] levothyroxine  125 mcg Oral QAC breakfast  . [START ON 11/21/2019] lidocaine-prilocaine  1 application Topical Q M,W,F-HD  . midodrine  5 mg Oral Q M,W,F  . pantoprazole (PROTONIX) IV  40 mg Intravenous Q12H  . [START ON 11/19/2019] predniSONE  5 mg Oral Q breakfast  . umeclidinium bromide  1 puff Inhalation Daily   Continuous Infusions: . dextrose 50 mL/hr at 11/18/19 1649   PRN Meds: acetaminophen **OR** acetaminophen, alum & mag hydroxide-simeth, loperamide, ondansetron **OR** ondansetron (ZOFRAN) IV  Allergies:    Allergies  Allergen Reactions  . Mercaptopurine Other (See Comments)    Caused pancreatitis  . Remicade [Infliximab] Other (See Comments)    CAUSED JOINT PAIN    Social History:   Social History   Socioeconomic History  . Marital status: Married    Spouse name: [name]  . Number of children: 3  . Years of education: 25  . Highest education level: Not on file  Occupational History  . Occupation: Retired    Fish farm manager: RETIRED  Tobacco Use  . Smoking status: Former Smoker    Packs/day: 1.00    Years: 35.00     Pack years: 35.00    Types: Cigarettes    Start date: 02/22/1967    Quit date: 11/27/2017    Years since quitting: 1.9  . Smokeless tobacco: Never Used  Vaping Use  . Vaping Use: Never used  Substance and Sexual Activity  . Alcohol use: No  . Drug use: No  . Sexual activity: Never  Other Topics Concern  . Not on file  Social History Narrative   Patient is married [name]) and lives at home with her husband.   Patient has three children.   Patient is retired on disability.   Patient drinks two cups of caffeine daily.   Patient is right-handed.         Social Determinants of Health   Financial Resource Strain:   . Difficulty of Paying Living Expenses:   Food Insecurity:   . Worried About Charity fundraiser in the Last Year:   . Arboriculturist in the Last Year:   Transportation Needs:   . Film/video editor (Medical):   [name] Lack of Transportation (Non-Medical):   Physical Activity:   . Days of Exercise per Week:   . Minutes of Exercise per Session:   Stress:   . Feeling of Stress :   Social Connections:   . Frequency of Communication with Friends and Family:   . Frequency of Social Gatherings with Friends and Family:   . Attends Religious Services:   . Active Member of Clubs or Organizations:   . Attends Archivist Meetings:   [name] Marital Status:   Intimate Partner Violence:   . Fear of Current or Ex-Partner:   . Emotionally Abused:   [name] Physically Abused:   . Sexually Abused:     Family History:    Family History  Problem Relation Age of Onset  . Hypertension Father   . Heart disease Father   . Heart attack Father   . Diverticulitis  Mother   . COPD Mother   . Hypertension Mother   . Heart disease Mother   . Heart attack Mother   . Heart attack Brother   . Colon cancer Brother   . Diabetes Brother   . Thyroid disease Brother      ROS:  Please see the history of present illness.   All other ROS reviewed and negative.     Physical  Exam/Data:   Vitals:   11/18/19 1330 11/18/19 1600 11/18/19 1615 11/18/19 1640  BP: (!) 87/59 (!) 96/57 110/64 (!) 113/53  Pulse: 70 70  67  Resp: 16 16 18 16   Temp:    (!) 97.4 F (36.3 C)  TempSrc:    Oral  SpO2: 100% 100%  96%  Weight:      Height:        Intake/Output Summary (Last 24 hours) at 11/18/2019 1935 Last data filed at 11/18/2019 1825 Gross per 24 hour  Intake 680 ml  Output --  Net 680 ml   Last 3 Weights 11/18/2019 11/17/2019 11/16/2019  Weight (lbs) 111 lb 12.8 oz 111 lb 12.8 oz 114 lb  Weight (kg) 50.712 kg 50.712 kg 51.71 kg     Body mass index is 19.19 kg/m.  General:  Well nourished, well developed, in no acute distress.  She is very lean HEENT: normal Lymph: no adenopathy Neck: no JVD Endocrine:  No thryomegaly Vascular: No carotid bruits; FA pulses 2+ bilaterally without bruits  Cardiac:  normal S1, S2; RRR; no Diastolic murmur; 2/6 aortic ejection murmur, early peaking; large, well-developed AV fistula left upper extremity with excellent thrill and bruit Lungs: Distant breath sounds, but clear to auscultation bilaterally, no wheezing, rhonchi or rales  Abd: soft, nontender, no hepatomegaly  Ext: no edema Musculoskeletal:  No deformities, BUE and BLE strength normal and equal Skin: warm and dry  Neuro:  CNs 2-12 intact, no focal abnormalities noted Psych:  Normal affect   EKG:  The EKG was personally reviewed and demonstrates: Normal sinus rhythm, nonspecific ST segment changes, delayed R wave progression The ECG from 10/18/2019 shows SVT with relatively long RP tachycardia, but the response to adenosine suggest AV node dependent reentry, possibly atypical (slow slow) AV node reentry Telemetry:  Telemetry was personally reviewed and demonstrates: Normal sinus rhythm  Relevant CV Studies: Echo 10/29/2018 1. The left ventricle has hyperdynamic systolic function, with an  ejection fraction of >65%. The cavity size was normal. Left ventricular  diastolic  Doppler parameters are consistent with impaired relaxation.  2. The right ventricle has normal systolic function. The cavity was  normal. There is no increase in right ventricular wall thickness. Right  ventricular systolic pressure is mildly elevated with an estimated  pressure of 44.3 mmHg.  3. The aortic valve is tricuspid. Mild thickening of the aortic valve.  Moderate calcification of the aortic valve.   Laboratory Data:  High Sensitivity Troponin:   Recent Labs  Lab 11/18/19 1119 11/18/19 1321  TROPONINIHS 46* 95*     Chemistry Recent Labs  Lab 11/15/19 1317 11/18/19 1119  NA 140 138  K 3.0* 3.2*  CL 103 105  CO2 24 20*  GLUCOSE 75 194*  BUN 23 36*  CREATININE 6.57* 7.98*  CALCIUM 8.7* 8.3*  GFRNONAA 6* 5*  GFRAA 7* 5*  ANIONGAP 13 13    Recent Labs  Lab 11/15/19 1317 11/18/19 1119  PROT 6.8 5.6*  ALBUMIN 3.1* 2.6*  AST 16 18  ALT 10 11  ALKPHOS  82 60  BILITOT 0.6 0.2*   Hematology Recent Labs  Lab 11/15/19 1317 11/18/19 1119  WBC 5.1 4.2  RBC 3.63* 3.36*  HGB 11.6* 10.6*  HCT 38.3 36.5  MCV 105.5* 108.6*  MCH 32.0 31.5  MCHC 30.3 29.0*  RDW 14.7 14.6  PLT 112* 102*   BNPNo results for input(s): BNP, PROBNP in the last 168 hours.  DDimer No results for input(s): DDIMER in the last 168 hours.   Radiology/Studies:  CT Angio Chest PE W and/or Wo Contrast  Result Date: 11/18/2019 CLINICAL DATA:  Shortness of breath with nausea, dizziness and abdominal pain. Atrial fibrillation with rapid ventricular response. Clinical concern for acute pulmonary embolism. Chronic kidney disease on hemodialysis. EXAM: CT ANGIOGRAPHY CHEST CT ABDOMEN AND PELVIS WITH CONTRAST TECHNIQUE: Multidetector CT imaging of the chest was performed using the standard protocol during bolus administration of intravenous contrast. Multiplanar CT image reconstructions and MIPs were obtained to evaluate the vascular anatomy. Multidetector CT imaging of the abdomen and pelvis was  performed using the standard protocol during bolus administration of intravenous contrast. CONTRAST:  197m OMNIPAQUE IOHEXOL 350 MG/ML SOLN COMPARISON:  PET-CT 11/01/2019. Chest CT 10/18/2019 and abdominopelvic CT 09/20/2019. FINDINGS: CTA CHEST FINDINGS Cardiovascular: The pulmonary arteries are well opacified with contrast to the level of the subsegmental branches. There is no evidence of acute pulmonary embolism. Stable mild central enlargement of the pulmonary arteries and mild atherosclerosis of the aorta, great vessels and coronary arteries. Left axillary and brachial stent noted. The heart size is normal. There is no pericardial effusion. Mediastinum/Nodes: Mildly prominent left hilar lymph nodes are grossly stable. There are no enlarged mediastinal or hilar lymph nodes. Previous thyroidectomy. The trachea and esophagus demonstrate no significant findings. Lungs/Pleura: No pleural effusion or pneumothorax. Mild dependent atelectasis at both lung bases. Moderate centrilobular and paraseptal emphysema. Known hypermetabolic left lung nodules are again noted with slight enlargement of the dominant perihilar lingular component measuring up to 2.2 cm on image 41/7 (previously 2.0 cm). Additional nodules are present the left lung, including a 9 mm left upper lobe nodule on image 24/7 and a spiculated left upper lobe nodule measuring 10 mm on image 26/7. No suspicious right lung nodules. Musculoskeletal/Chest wall: Inferior endplate lucency and surrounding sclerosis again noted within the T10 vertebral body. Although hypermetabolic on prior PET-CT, the appearance is most typical of a Schmorl's node. No acute osseous findings. CT ABDOMEN AND PELVIS FINDINGS Hepatobiliary: The liver is normal in density without suspicious focal abnormality. Probable focal fat adjacent to the falciform ligament. Stable chronic mild intrahepatic and extrahepatic biliary dilatation post cholecystectomy. Pancreas: Pancreas divisum anatomy  with stable mild chronic pancreatic ductal dilatation. No evidence of pancreatic mass or surrounding inflammation. Spleen: Normal in size without focal abnormality. Adrenals/Urinary Tract: Both adrenal glands appear normal. Numerous bilateral renal calculi and low-density renal lesions are again noted, similar to previous study. There is bilateral renal cortical thinning and absent contrast excretion consistent with chronic renal failure. No hydronephrosis. No significant bladder findings. 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 No small bowel abnormalities are identified status post right hemicolectomy and anastomosis. There are diverticular changes in the descending and sigmoid colon with possible mild sigmoid colon wall thickening, but no surrounding inflammation or significant distension. Vascular/Lymphatic: There are no enlarged abdominal or pelvic lymph nodes. Moderate aortic and branch vessel atherosclerosis without acute vascular findings. There is mild reflux of contrast into the IVC and hepatic veins. The portal, superior mesenteric  and splenic veins are patent. Reproductive: Hysterectomy. No adnexal mass. Stable curvilinear calcification between the vagina and rectum. Other: Intact abdominal wall. No ascites or peritoneal nodularity. Stable calcification in the left lower quadrant anterior to the sigmoid colon. Musculoskeletal: No acute or significant osseous findings. Review of the MIP images confirms the above findings. IMPRESSION: 1. No evidence of acute pulmonary embolism or other acute chest process. 2. Known hypermetabolic left lung nodules consistent with metastatic disease. The dominant perihilar lingular component has mildly enlarged. 3. No acute findings in the abdomen or pelvis. 4. Bilateral nephrolithiasis and renal cortical thinning consistent with chronic renal failure. 5. Aortic Atherosclerosis (ICD10-I70.0) and Emphysema  (ICD10-J43.9). Electronically Signed   By: Richardean Sale M.D.   On: 11/18/2019 13:16   CT ABDOMEN PELVIS W CONTRAST  Result Date: 11/18/2019 CLINICAL DATA:  Shortness of breath with nausea, dizziness and abdominal pain. Atrial fibrillation with rapid ventricular response. Clinical concern for acute pulmonary embolism. Chronic kidney disease on hemodialysis. EXAM: CT ANGIOGRAPHY CHEST CT ABDOMEN AND PELVIS WITH CONTRAST TECHNIQUE: Multidetector CT imaging of the chest was performed using the standard protocol during bolus administration of intravenous contrast. Multiplanar CT image reconstructions and MIPs were obtained to evaluate the vascular anatomy. Multidetector CT imaging of the abdomen and pelvis was performed using the standard protocol during bolus administration of intravenous contrast. CONTRAST:  116m OMNIPAQUE IOHEXOL 350 MG/ML SOLN COMPARISON:  PET-CT 11/01/2019. Chest CT 10/18/2019 and abdominopelvic CT 09/20/2019. FINDINGS: CTA CHEST FINDINGS Cardiovascular: The pulmonary arteries are well opacified with contrast to the level of the subsegmental branches. There is no evidence of acute pulmonary embolism. Stable mild central enlargement of the pulmonary arteries and mild atherosclerosis of the aorta, great vessels and coronary arteries. Left axillary and brachial stent noted. The heart size is normal. There is no pericardial effusion. Mediastinum/Nodes: Mildly prominent left hilar lymph nodes are grossly stable. There are no enlarged mediastinal or hilar lymph nodes. Previous thyroidectomy. The trachea and esophagus demonstrate no significant findings. Lungs/Pleura: No pleural effusion or pneumothorax. Mild dependent atelectasis at both lung bases. Moderate centrilobular and paraseptal emphysema. Known hypermetabolic left lung nodules are again noted with slight enlargement of the dominant perihilar lingular component measuring up to 2.2 cm on image 41/7 (previously 2.0 cm). Additional nodules  are present the left lung, including a 9 mm left upper lobe nodule on image 24/7 and a spiculated left upper lobe nodule measuring 10 mm on image 26/7. No suspicious right lung nodules. Musculoskeletal/Chest wall: Inferior endplate lucency and surrounding sclerosis again noted within the T10 vertebral body. Although hypermetabolic on prior PET-CT, the appearance is most typical of a Schmorl's node. No acute osseous findings. CT ABDOMEN AND PELVIS FINDINGS Hepatobiliary: The liver is normal in density without suspicious focal abnormality. Probable focal fat adjacent to the falciform ligament. Stable chronic mild intrahepatic and extrahepatic biliary dilatation post cholecystectomy. 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 No evidence of pancreatic mass or surrounding inflammation. Spleen: Normal in size without focal abnormality. Adrenals/Urinary Tract: Both adrenal glands appear normal. Numerous bilateral renal calculi and low-density renal lesions are again noted, similar to previous study. There is bilateral renal cortical thinning and absent contrast excretion consistent with chronic renal failure. No hydronephrosis. No significant bladder findings. 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 No small bowel abnormalities are identified status post right hemicolectomy and anastomosis. There are diverticular changes in the descending and sigmoid colon with possible mild sigmoid colon  wall thickening, but no surrounding inflammation or significant distension. Vascular/Lymphatic: There are no enlarged abdominal or pelvic lymph nodes. Moderate aortic and branch vessel atherosclerosis without acute vascular findings. There is mild reflux of contrast into the IVC and hepatic veins. The portal, superior mesenteric and splenic veins are patent. Reproductive: Hysterectomy. No adnexal mass. Stable curvilinear calcification  between the vagina and rectum. Other: Intact abdominal wall. No ascites or peritoneal nodularity. Stable calcification in the left lower quadrant anterior to the sigmoid colon. Musculoskeletal: No acute or significant osseous findings. Review of the MIP images confirms the above findings. IMPRESSION: 1. No evidence of acute pulmonary embolism or other acute chest process. 2. Known hypermetabolic left lung nodules consistent with metastatic disease. The dominant perihilar lingular component has mildly enlarged. 3. No acute findings in the abdomen or pelvis. 4. Bilateral nephrolithiasis and renal cortical thinning consistent with chronic renal failure. 5. Aortic Atherosclerosis (ICD10-I70.0) and Emphysema (ICD10-J43.9). Electronically Signed   By: Richardean Sale M.D.   On: 11/18/2019 13:16   DG Chest Port 1 View  Result Date: 11/18/2019 CLINICAL DATA:  Former smoker with chest pain. History of emphysema. Hypertension. EXAM: PORTABLE CHEST 1 VIEW COMPARISON:  CT chest 10/18/2019 FINDINGS: Normal heart size. Pulmonary vascular congestion. No pleural effusion or overt edema. No acute airspace consolidation. Left mid lung nodule corresponding to FDG avid 2.3 cm left upper lobe lung nodule seen on previous CT of the chest is unchanged and remains worrisome for primary bronchogenic carcinoma. The lungs appear hyperinflated and there are diffuse interstitial changes compatible with emphysema. IMPRESSION: 1. Pulmonary vascular congestion without overt edema or pleural effusion. 2. Left upper lobe lung nodule is unchanged and remains worrisome for primary bronchogenic carcinoma. 3.  Emphysema (ICD10-J43.9). Electronically Signed   By: Kerby Moors M.D.   On: 11/18/2019 11:17   {  Assessment and Plan:   1. AVNRT: Adenosine responsive.  Discussed the Valsalva maneuver and/or diving reflex for arrhythmia termination.  Hard to call this a beta-blocker failure since she only took 2 doses of the medication.   Nevertheless, beta-blockers may not be the best choice for arrhythmia prevention since she also has reactive airway disease and takes inhaled bronchodilators frequently.  Will try to see if she tolerates diltiazem better as far as her blood pressure is concerned and see if it works better for arrhythmia prevention.  The challenge will be especially on dialysis days when her blood pressure will be lower.  Unfortunately she is not a good candidate for digoxin due to her renal dysfunction.  Amiodarone could be considered.  Ideally, she could undergo a radiofrequency ablation procedure, but this should not be undertaken lightly since the risk of AV block and needing a pacemaker would have much more serious implications in a patient with ESRD.  In addition, need to take into consideration the possible limitation in prognosis connected to her lung mass. 2. COPD: On chronic inhaled bronchodilators which increase the likelihood of arrhythmia. 3. CHF: No evidence of hypervolemia or heart failure decompensation at this time. 4. ESRD: Using midodrine to keep her blood pressure high enough for dialysis.  Unfortunately, all usual AV nodal blocking agents will make this worse. 5. Left lung mass: Work-up in progress.  PET scans suggest that this might be an isolated primary bronchogenic carcinoma.      For questions or updates, please contact [facility] Please consult [email or web address] for contact info under    Signed, Sanda Klein, MD  11/18/2019 7:35 PM

## 2019-11-18 NOTE — Progress Notes (Addendum)
[facility] Observation Note  Nephrology aware of pt being admitted to [place]. Natasha [name] presented to the ED today after sudden onset diarrhea, nausea, epigastric pain and dizziness this AM. Typically dialyzes on MWF schedule but missed HD today due to ED visit. She does note that she was recently started on metoprolol by Dr. Acie Fredrickson and took two doses as prescribed yesterday. Noted by EMS to have systolic BP in the 83'J which did improve with IV fluids. Labs and vital signs reviewed. Pt denies SOB, CP, palpitations, and nausea. Still feels slightly light headed and ongoing abdominal pain.  General: Alert and in NAD Cardiac: RRR, no murmur, no JVD Resp: respirations unlabored on O2 3L, lungs CTA bilaterally Abdomen: soft, non-distended, +TTP epigastric region Extremities: No edema b/l lower extremities Access: LUE AVF +bruit  Assessment/plan: BP soft, not volume overloaded on exam. New med (metoprolol) may have contributed to hypotension though this does not explain abdominal pain. K+ 3.2 with supplementation ordered. No uremic sx. No acute need for HD today. We will plan to dialyze tomorrow then resume MWF schedule.  Anice Paganini, PA-C 11/18/2019, 3:19 PM  [facility] Pager: [phone]  Patient seen and examined, agree with above note with above modifications.  Pt seen-  Feels a whole lot better.  Agree that likely the metoprolol has led to lower BP and possibly some ischemic bowel.  So far better with conservative management.  Plan for HD tomorrow since missed today-  Likely will not remove much volume due to BP  Corliss Parish, MD 11/18/2019

## 2019-11-18 NOTE — ED Notes (Signed)
cbg of 55.  Dr Tamala Julian notified at bedside.

## 2019-11-18 NOTE — ED Provider Notes (Signed)
[facility] EMERGENCY DEPARTMENT Provider Note   CSN: [id number] Arrival date & time: 11/18/19  1012     History Chief Complaint  Patient presents with  . Tachycardia    afib rvr  . Near Syncope    Natasha [name] is a 74 y.o. female.  74 yo F with a chief complaints of epigastric abdominal pain.  This woke her up from sleep this morning described as a severe pain.  No radiation causing her to be nauseated and vomit.  She also felt very weak and lightheaded like she may pass out.  She called 911 and was brought for evaluation.  Gets dialysis Monday Wednesday and Friday.  Denies any missed dialysis sessions.  Denies any increase in her fluid.  She was seen in the ED about a month ago for supraventricular tachycardia got adenosine and had improvement of her symptoms and was discharged home.  She has since followed up with her cardiologist.  She also has a history of lung cancer but looks like she may have had a recurrence or a new carcinoma.  She denies any new medicine changes.  Denies fevers.  Tells me she was in her normal state of health until this morning.  The history is provided by the patient.  Near Syncope Associated symptoms include abdominal pain. Pertinent negatives include no chest pain, no headaches and no shortness of breath.  Abdominal Pain Pain location:  Epigastric Pain quality comment:  Patient is unable to describe tells me that its a pain Pain radiates to:  Does not radiate Pain severity:  Severe Onset quality:  Sudden Duration:  5 hours Timing:  Constant Chronicity:  New Context: awakening from sleep   Relieved by:  Nothing Worsened by:  Nothing Ineffective treatments:  None tried Associated symptoms: nausea and vomiting   Associated symptoms: no chest pain, no chills, no cough, no diarrhea, no dysuria, no fever and no shortness of breath        Past Medical History:  Diagnosis Date  . Anal fistula   . Anemia in chronic kidney  disease (CKD)   . Aortic insufficiency    Echo 3/18: mod conc LVH, EF 60-65, no RWMA, Gr 1 DD, mod AI, mild MR, normal RVSF, Trivial TR  . Arthritis   . Borderline hypertension   . Bulging disc    L3-L4  . Chronic diarrhea    due to crohn's  . CKD (chronic kidney disease), stage IV ([place])    MWF- [address]  . Crohn's disease ([place])    chronic ileitis  . Dyspnea    with exertion  . Emphysema/COPD ([place])   . GERD (gastroesophageal reflux disease)    denies  . History of blood transfusion   . History of glaucoma   . History of kidney stones   . History of pancreatitis    2008--  mercaptopurine  . History of small bowel obstruction    12-03-2010  due to crohn's ileitis  . Hypertension    no longer on medications  . Hypothyroidism, postsurgical    multinodule w/ hurthle cells  . Osteoporosis   . PAF (paroxysmal atrial fibrillation) ([place])   . Perianal Crohn's disease ([place])   . Peripheral vascular disease ([place])    blood clot behind knee left leg  . Polyarthralgia   . Seizures ([place])    03/2017  . Wears partial dentures    upper    Patient Active Problem List   Diagnosis Date Noted  .  Malignant neoplasm of overlapping sites of left lung ([place]) 11/17/2019  . Lung cancer ([place]) 11/15/2019  . Goals of care, counseling/discussion 11/15/2019  . Pneumonia of right lower lobe due to infectious organism 09/27/2019  . History of COVID-19 04/13/2019  . Acute on chronic respiratory failure with hypoxia ([place]) 04/13/2019  . Chronic anticoagulation 04/13/2019  . Medication management 02/22/2019  . Chronic respiratory failure with hypoxia ([place]) 09/23/2018  . Hx of Pulmonary embolism ([place]) 05/28/2018  . COPD with acute exacerbation ([place]) 03/31/2018  . Acute sinusitis 03/31/2018  . Crohn's disease of colon with complication ([place])   . Aortic insufficiency 07/30/2016  . Atherosclerosis of aorta ([place]) 07/30/2016  . Pressure injury of skin 07/14/2016  . Gastroenteritis, acute 07/12/2016    . COPD (chronic obstructive pulmonary disease) ([place]) 10/12/2015  . Malnutrition of moderate degree ([place]) 09/13/2014  . history of Left leg DVT 09/10/2014  . Hypotension 09/10/2014  . Hypoglycemia 09/10/2014  . Ischiorectal abscess 09/09/2014  . Increased anion gap metabolic acidosis   . Generalized abdominal pain   . Chronic diastolic CHF (congestive heart failure) ([place])   . Hypertensive heart disease   . Secondary hyperparathyroidism ([place])   . Other specified hypothyroidism   . Right shoulder pain   . Thrombocytopenia ([place])   . Severe protein-calorie malnutrition ([place])   . Thyroid activity decreased   . Emphysema of lung ([place])   . Nephrolithiasis 03/29/2014  . Spinal stenosis of lumbar region 11/01/2013  . Bilateral leg pain 09/28/2013  . Emphysema/COPD ([place])   . Shortness of breath 09/20/2013  . Protein-calorie malnutrition, severe ([place]) 05/14/2013  . Salmonella enteritidis 06/11/2012  . Hypocalcemia 06/10/2012  . Hypothyroidism 06/10/2012  . Anemia of chronic disease 06/09/2012  . Hypomagnesemia 06/09/2012  . ESRD on dialysis ([place]) 06/09/2012  . Dehydration 06/07/2012  . Crohn's disease without complication ([place]) 84/16/6063  . Metabolic acidosis 01/60/1093  . Hypokalemia 06/07/2012  . Nausea vomiting and diarrhea 06/07/2012  . UTI (lower urinary tract infection) 06/07/2012    Past Surgical History:  Procedure Laterality Date  . ABDOMINAL HYSTERECTOMY  1990   and  Appendectomy  . AV FISTULA PLACEMENT Left 12/08/2017   Procedure: INSERTION OF ARTERIOVENOUS (AV) GRAFT WITH ARTEGRAFT TO LEFT UPPER ARM;  Surgeon: Conrad Rockland, MD;  Location: [facility];  Service: Vascular;  Laterality: Left;  . BASCILIC VEIN TRANSPOSITION Left 01/09/2017   Procedure: LEFT 1ST STAGE BRACHIAL VEIN TRANSPOSITION;  Surgeon: Conrad [place] Bank, MD;  Location: [facility];  Service: Vascular;  Laterality: Left;  . BASCILIC VEIN TRANSPOSITION Left 06/16/2017   Procedure: Second Stage BASILIC VEIN TRANSPOSITION   LEFT ARM;  Surgeon: Conrad Liberty, MD;  Location: [facility];  Service: Vascular;  Laterality: Left;  . BIOPSY  03/09/2018   Procedure: BIOPSY;  Surgeon: Wilford Corner, MD;  Location: [facility];  Service: Endoscopy;;  . CHOLECYSTECTOMY    . COLON RESECTION  x3 --  1978,  1987, 1989   ILEAL RESECTION x2/   [place]  . COLONOSCOPY WITH PROPOFOL N/A 09/25/2014   Procedure: COLONOSCOPY WITH PROPOFOL;  Surgeon: Garlan Fair, MD;  [facility]: [facility];  Service: Endoscopy;  Laterality: N/A;  . COLONOSCOPY WITH PROPOFOL N/A 03/09/2018   Procedure: COLONOSCOPY WITH PROPOFOL;  Surgeon: Wilford Corner, MD;  [facility];  Service: Endoscopy;  Laterality: N/A;  . CYSTOSCOPY W/ URETERAL STENT PLACEMENT Right 11/19/2016   Procedure: CYSTOSCOPY WITH RIGHT RETROGRADE PYELOGRAM/ URETEROSCOPY WITH LASER AND RIGHT URETERAL STENT PLACEMENT;  Surgeon: Louis Meckel,  Viona Gilmore, MD;  Location: [facility];  Service: Urology;  Laterality: Right;  . ESOPHAGOGASTRODUODENOSCOPY  03/04/2012   Procedure: ESOPHAGOGASTRODUODENOSCOPY (EGD);  Surgeon: Garlan Fair, MD;  [facility];  Service: Endoscopy;  Laterality: N/A;  . EXAMINATION UNDER ANESTHESIA N/A 09/12/2014   Procedure: EXAM UNDER ANESTHESIA;  Surgeon: Rolm Bookbinder, MD;  Location: [facility];  Service: General;  Laterality: N/A;  . EYE SURGERY     laser  . FLEXIBLE SIGMOIDOSCOPY  03/04/2012   Procedure: FLEXIBLE SIGMOIDOSCOPY;  Surgeon: Garlan Fair, MD;  [facility];  Service: Endoscopy;  Laterality: N/A;  . GLAUCOMA SURGERY Bilateral   . HOLMIUM LASER APPLICATION Right 0/25/4270   Procedure: HOLMIUM LASER APPLICATION;  Surgeon: Ardis Hughs, MD;  [facility];  Service: Urology;  Laterality: Right;  . INCISION AND DRAINAGE PERIRECTAL ABSCESS N/A 09/12/2014   Procedure: IRRIGATION AND DEBRIDEMENT PERIRECTAL ABSCESS;  Surgeon: Rolm Bookbinder, MD;  Location: [facility];  Service: General;  Laterality: N/A;    . IR FLUORO GUIDE CV LINE RIGHT  03/20/2017  . IR US GUIDE VASC ACCESS RIGHT  03/20/2017  . NEGATIVE SLEEP STUDY  2008  . PLACEMENT OF SETON N/A 12/01/2014   Procedure: PLACEMENT OF SETON;  Surgeon: [name], MD;  Location: [facility];  Service: General;  Laterality: N/A;  . POLYPECTOMY  03/09/2018   Procedure: POLYPECTOMY;  Surgeon: Wilford Corner, MD;  [facility];  Service: Endoscopy;;  . RECTAL EXAM UNDER ANESTHESIA N/A 12/01/2014   Procedure: RECTAL EXAM UNDER ANESTHESIA;  Surgeon: [name], MD;  Location: [facility];  Service: General;  Laterality: N/A;  . TOTAL THYROIDECTOMY  10-13-2003  . TRANSTHORACIC ECHOCARDIOGRAM  07-11-2013   mild LVH,  ef 55%,  moderate AR,  mild MR and TR,  trivial PR  . TUBAL LIGATION       OB History   No obstetric history on file.     Family History  Problem Relation Age of Onset  . Hypertension Father   . Heart disease Father   . Heart attack Father   . Diverticulitis Mother   . COPD Mother   . Hypertension Mother   . Heart disease Mother   . Heart attack Mother   . Heart attack Brother   . Colon cancer Brother   . Diabetes Brother   . Thyroid disease Brother     Social History   Tobacco Use  . Smoking status: Former Smoker    Packs/day: 1.00    Years: 35.00    Pack years: 35.00    Types: Cigarettes    Start date: 02/22/1967    Quit date: 11/27/2017    Years since quitting: 1.9  . Smokeless tobacco: Never Used  Vaping Use  . Vaping Use: Never used  Substance Use Topics  . Alcohol use: No  . Drug use: No    Home Medications Prior to Admission medications   Medication Sig Start Date End Date Taking? Authorizing Provider  acetaminophen (TYLENOL) 500 MG tablet Take 1,000 mg by mouth every 6 (six) hours as needed for mild pain.    Yes [provider]  Adalimumab (HUMIRA) 40 MG/0.8ML PSKT Inject 40 mg into the skin every 14 (fourteen) days.    Yes [provider]  albuterol (VENTOLIN HFA) 108 (90 Base) MCG/ACT inhaler INHALE 2 PUFFS BY MOUTH EVERY 6 HOURS IF NEEDED FOR WHEEZING OR SHORTNESS OF BREATH 05/31/19  Yes Chesley Mires, MD  alendronate (FOSAMAX) 70  MG tablet Take 70 mg by mouth every Monday.  11/06/16  Yes [provider]  calcitRIOL (ROCALTROL) 0.25 MCG capsule Take 0.25 mcg by mouth every Monday, Wednesday, and Friday with hemodialysis. 02/03/17  Yes [provider]  calcium acetate (PHOSLO) 667 MG capsule Take 1 capsule (667 mg total) by mouth 3 (three) times daily with meals. 06/06/18  Yes Domenic Polite, MD  calcium-vitamin D (OSCAL WITH D) 500-200 MG-UNIT TABS tablet Take 1 tablet by mouth as needed (with dialysis).    Yes [provider]  cyanocobalamin (,VITAMIN B-12,) 1000 MCG/ML injection Inject 1,000 mcg into the muscle every 30 (thirty) days.    Yes [provider]  ELIQUIS 5 MG TABS tablet Take 2.5 mg by mouth 2 (two) times daily. Takes 1/2 tablet (2.16m) bid 08/23/18  Yes [provider]  fluticasone (FLONASE) 50 MCG/ACT nasal spray Place 1-2 sprays into both nostrils daily. 03/19/19  Yes [provider]  Fluticasone-Umeclidin-Vilant (TRELEGY ELLIPTA) 100-62.5-25 MCG/INH AEPB Inhale 1 puff into the lungs daily. 07/25/19  Yes SChesley Mires MD  heparin 1000 UNIT/ML injection Inject 1,000 Units into the vein 3 (three) times a week. 12/01/18 11/30/19 Yes [provider]  ipratropium-albuterol (DUONEB) 0.5-2.5 (3) MG/3ML SOLN Take 3 mLs by nebulization every 6 (six) hours as needed. 12/30/18  Yes NFenton Foy NP  levothyroxine (SYNTHROID, LEVOTHROID) 125 MCG tablet Take 125 mcg by mouth daily before breakfast.    Yes [provider]  lidocaine-prilocaine (EMLA) cream Apply 1 application topically every Monday, Wednesday, and Friday.  11/29/18  Yes [provider]  loperamide (IMODIUM) 2 MG capsule Take 2 capsules (4 mg total) by mouth as needed for diarrhea or loose  stools. 03/27/17  Yes Gherghe, CVella Redhead MD  Methoxy PEG-Epoetin Beta (MIRCERA IJ) Inject 150 mg as directed every 14 (fourteen) days. 06/13/19 06/11/20 Yes [provider]  metoprolol tartrate (LOPRESSOR) 25 MG tablet Take 1 tablet (25 mg total) by mouth 2 (two) times daily. 11/16/19 02/14/20 Yes Nahser, PWonda Cheng MD  midodrine (PROAMATINE) 5 MG tablet Take 5 mg by mouth every Monday, Wednesday, and Friday.  04/30/17  Yes [provider]  nitroGLYCERIN (NITROSTAT) 0.4 MG SL tablet Place 1 tablet (0.4 mg total) under the tongue every 5 (five) minutes as needed for chest pain. 05/03/19 11/18/19 Yes Nahser, PWonda Cheng MD  ondansetron (ZOFRAN ODT) 4 MG disintegrating tablet Take 1 tablet (4 mg total) by mouth every 8 (eight) hours as needed for nausea or vomiting. 03/29/18  Yes FDeno Etienne DO  OXYGEN Inhale 2-3 L into the lungs See admin instructions. Use every night and during the day as needed for shortness of breath   Yes [provider]  predniSONE (DELTASONE) 5 MG tablet Take 5 mg by mouth daily with breakfast.   Yes [provider]  Fluticasone-Umeclidin-Vilant (TRELEGY ELLIPTA) 100-62.5-25 MCG/INH AEPB Inhale 1 puff into the lungs daily. Patient not taking: Reported on 11/18/2019 11/17/19   WMartyn Ehrich NP  Respiratory Therapy Supplies (NEBULIZER) DEVI 1 Device by Does not apply route as needed. 12/07/18   NFenton Foy NP  zinc sulfate 220 (50 Zn) MG capsule Take 1 capsule (220 mg total) by mouth daily. Patient not taking: Reported on 11/18/2019 04/17/19   PElodia Florence, MD    Allergies    Mercaptopurine and Remicade [infliximab]  Review of Systems   Review of Systems  Constitutional: Negative for chills and fever.  HENT: Negative for congestion and rhinorrhea.  Eyes: Negative for redness and visual disturbance.  Respiratory: Negative for cough, shortness of breath and wheezing.   Cardiovascular: Positive for near-syncope. Negative for chest  pain and palpitations.  Gastrointestinal: Positive for abdominal pain, nausea and vomiting. Negative for diarrhea.  Genitourinary: Negative for dysuria and urgency.  Musculoskeletal: Negative for arthralgias and myalgias.  Skin: Negative for pallor and wound.  Neurological: Negative for dizziness and headaches.    Physical Exam Updated Vital Signs Temp 97.8 F (36.6 C) (Oral)   Ht 5' 4"  (1.626 m)   Wt 50.7 kg   BMI 19.19 kg/m   Physical Exam Vitals and nursing note reviewed.  Constitutional:      General: She is not in acute distress.    Appearance: She is well-developed. She is not diaphoretic.  HENT:     Head: Normocephalic and atraumatic.  Eyes:     Pupils: Pupils are equal, round, and reactive to light.  Cardiovascular:     Rate and Rhythm: Normal rate and regular rhythm.     Heart sounds: No murmur heard.  No friction rub. No gallop.   Pulmonary:     Effort: Pulmonary effort is normal.     Breath sounds: No wheezing or rales.  Abdominal:     General: There is no distension.     Palpations: Abdomen is soft.     Tenderness: There is abdominal tenderness (epigastric and RUQ, worst to the epigastrium, negative murphy).  Musculoskeletal:        General: No tenderness.     Cervical back: Normal range of motion and neck supple.  Skin:    General: Skin is warm and dry.  Neurological:     Mental Status: She is alert and oriented to person, place, and time.  Psychiatric:        Behavior: Behavior normal.     ED Results / Procedures / Treatments   Labs (all labs ordered are listed, but only abnormal results are displayed) Labs Reviewed  CBC - Abnormal; Notable for the following components:      Result Value   RBC 3.36 (*)    Hemoglobin 10.6 (*)    MCV 108.6 (*)    MCHC 29.0 (*)    Platelets 102 (*)    All other components within normal limits  COMPREHENSIVE METABOLIC PANEL - Abnormal; Notable for the following components:   Potassium 3.2 (*)    CO2 20 (*)     Glucose, Bld 194 (*)    BUN 36 (*)    Creatinine, Ser 7.98 (*)    Calcium 8.3 (*)    Total Protein 5.6 (*)    Albumin 2.6 (*)    Total Bilirubin 0.2 (*)    GFR calc non Af Amer 5 (*)    GFR calc Af Amer 5 (*)    All other components within normal limits  LIPASE, BLOOD - Abnormal; Notable for the following components:   Lipase 56 (*)    All other components within normal limits  CBG MONITORING, ED - Abnormal; Notable for the following components:   Glucose-Capillary 58 (*)    All other components within normal limits  TROPONIN I (HIGH SENSITIVITY) - Abnormal; Notable for the following components:   Troponin I (High Sensitivity) 46 (*)    All other components within normal limits  SARS CORONAVIRUS 2 BY RT PCR (HOSPITAL ORDER, [facility])  LACTIC ACID, PLASMA  CBC WITH DIFFERENTIAL/PLATELET  LACTIC ACID, PLASMA  TROPONIN I (HIGH  SENSITIVITY)    EKG EKG Interpretation  Date/Time:  Friday November 18 2019 10:15:31 EDT Ventricular Rate:  72 PR Interval:    QRS Duration: 75 QT Interval:  407 QTC Calculation: 446 R Axis:   50 Text Interpretation: Sinus rhythm Anterior infarct, old Borderline ST elevation, lateral leads Since last tracing rate slower Otherwise no significant change Confirmed by Deno Etienne [phone]) on 11/18/2019 10:19:39 AM   Radiology CT Angio Chest PE W and/or Wo Contrast  Result Date: 11/18/2019 CLINICAL DATA:  Shortness of breath with nausea, dizziness and abdominal pain. Atrial fibrillation with rapid ventricular response. Clinical concern for acute pulmonary embolism. Chronic kidney disease on hemodialysis. EXAM: CT ANGIOGRAPHY CHEST CT ABDOMEN AND PELVIS WITH CONTRAST TECHNIQUE: Multidetector CT imaging of the chest was performed using the standard protocol during bolus administration of intravenous contrast. Multiplanar CT image reconstructions and MIPs were obtained to evaluate the vascular anatomy. Multidetector CT imaging of the abdomen  and pelvis was performed using the standard protocol during bolus administration of intravenous contrast. CONTRAST:  123m OMNIPAQUE IOHEXOL 350 MG/ML SOLN COMPARISON:  PET-CT 11/01/2019. Chest CT 10/18/2019 and abdominopelvic CT 09/20/2019. FINDINGS: CTA CHEST FINDINGS Cardiovascular: The pulmonary arteries are well opacified with contrast to the level of the subsegmental branches. There is no evidence of acute pulmonary embolism. Stable mild central enlargement of the pulmonary arteries and mild atherosclerosis of the aorta, great vessels and coronary arteries. Left axillary and brachial stent noted. The heart size is normal. There is no pericardial effusion. Mediastinum/Nodes: Mildly prominent left hilar lymph nodes are grossly stable. There are no enlarged mediastinal or hilar lymph nodes. Previous thyroidectomy. The trachea and esophagus demonstrate no significant findings. Lungs/Pleura: No pleural effusion or pneumothorax. Mild dependent atelectasis at both lung bases. Moderate centrilobular and paraseptal emphysema. Known hypermetabolic left lung nodules are again noted with slight enlargement of the dominant perihilar lingular component measuring up to 2.2 cm on image 41/7 (previously 2.0 cm). Additional nodules are present the left lung, including a 9 mm left upper lobe nodule on image 24/7 and a spiculated left upper lobe nodule measuring 10 mm on image 26/7. No suspicious right lung nodules. Musculoskeletal/Chest wall: Inferior endplate lucency and surrounding sclerosis again noted within the T10 vertebral body. Although hypermetabolic on prior PET-CT, the appearance is most typical of a Schmorl's node. No acute osseous findings. CT ABDOMEN AND PELVIS FINDINGS Hepatobiliary: The liver is normal in density without suspicious focal abnormality. Probable focal fat adjacent to the falciform ligament. Stable chronic mild intrahepatic and extrahepatic biliary dilatation post cholecystectomy. Pancreas: Pancreas  divisum anatomy with stable mild chronic pancreatic ductal dilatation. No evidence of pancreatic mass or surrounding inflammation. Spleen: Normal in size without focal abnormality. Adrenals/Urinary Tract: Both adrenal glands appear normal. Numerous bilateral renal calculi and low-density renal lesions are again noted, similar to previous study. There is bilateral renal cortical thinning and absent contrast excretion consistent with chronic renal failure. No hydronephrosis. No significant bladder findings. 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 No small bowel abnormalities are identified status post right hemicolectomy and anastomosis. There are diverticular changes in the descending and sigmoid colon with possible mild sigmoid colon wall thickening, but no surrounding inflammation or significant distension. Vascular/Lymphatic: There are no enlarged abdominal or pelvic lymph nodes. Moderate aortic and branch vessel atherosclerosis without acute vascular findings. There is mild reflux of contrast into the IVC and hepatic veins. The portal, superior mesenteric and splenic veins are patent. Reproductive: Hysterectomy. No adnexal  mass. Stable curvilinear calcification between the vagina and rectum. Other: Intact abdominal wall. No ascites or peritoneal nodularity. Stable calcification in the left lower quadrant anterior to the sigmoid colon. Musculoskeletal: No acute or significant osseous findings. Review of the MIP images confirms the above findings. IMPRESSION: 1. No evidence of acute pulmonary embolism or other acute chest process. 2. Known hypermetabolic left lung nodules consistent with metastatic disease. The dominant perihilar lingular component has mildly enlarged. 3. No acute findings in the abdomen or pelvis. 4. Bilateral nephrolithiasis and renal cortical thinning consistent with chronic renal failure. 5. Aortic Atherosclerosis (ICD10-I70.0) and  Emphysema (ICD10-J43.9). Electronically Signed   By: Richardean Sale M.D.   On: 11/18/2019 13:16   CT ABDOMEN PELVIS W CONTRAST  Result Date: 11/18/2019 CLINICAL DATA:  Shortness of breath with nausea, dizziness and abdominal pain. Atrial fibrillation with rapid ventricular response. Clinical concern for acute pulmonary embolism. Chronic kidney disease on hemodialysis. EXAM: CT ANGIOGRAPHY CHEST CT ABDOMEN AND PELVIS WITH CONTRAST TECHNIQUE: Multidetector CT imaging of the chest was performed using the standard protocol during bolus administration of intravenous contrast. Multiplanar CT image reconstructions and MIPs were obtained to evaluate the vascular anatomy. Multidetector CT imaging of the abdomen and pelvis was performed using the standard protocol during bolus administration of intravenous contrast. CONTRAST:  178m OMNIPAQUE IOHEXOL 350 MG/ML SOLN COMPARISON:  PET-CT 11/01/2019. Chest CT 10/18/2019 and abdominopelvic CT 09/20/2019. FINDINGS: CTA CHEST FINDINGS Cardiovascular: The pulmonary arteries are well opacified with contrast to the level of the subsegmental branches. There is no evidence of acute pulmonary embolism. Stable mild central enlargement of the pulmonary arteries and mild atherosclerosis of the aorta, great vessels and coronary arteries. Left axillary and brachial stent noted. The heart size is normal. There is no pericardial effusion. Mediastinum/Nodes: Mildly prominent left hilar lymph nodes are grossly stable. There are no enlarged mediastinal or hilar lymph nodes. Previous thyroidectomy. The trachea and esophagus demonstrate no significant findings. Lungs/Pleura: No pleural effusion or pneumothorax. Mild dependent atelectasis at both lung bases. Moderate centrilobular and paraseptal emphysema. Known hypermetabolic left lung nodules are again noted with slight enlargement of the dominant perihilar lingular component measuring up to 2.2 cm on image 41/7 (previously 2.0 cm). Additional  nodules are present the left lung, including a 9 mm left upper lobe nodule on image 24/7 and a spiculated left upper lobe nodule measuring 10 mm on image 26/7. No suspicious right lung nodules. Musculoskeletal/Chest wall: Inferior endplate lucency and surrounding sclerosis again noted within the T10 vertebral body. Although hypermetabolic on prior PET-CT, the appearance is most typical of a Schmorl's node. No acute osseous findings. CT ABDOMEN AND PELVIS FINDINGS Hepatobiliary: The liver is normal in density without suspicious focal abnormality. Probable focal fat adjacent to the falciform ligament. Stable chronic mild intrahepatic and extrahepatic biliary dilatation post cholecystectomy. 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 No evidence of pancreatic mass or surrounding inflammation. Spleen: Normal in size without focal abnormality. Adrenals/Urinary Tract: Both adrenal glands appear normal. Numerous bilateral renal calculi and low-density renal lesions are again noted, similar to previous study. There is bilateral renal cortical thinning and absent contrast excretion consistent with chronic renal failure. No hydronephrosis. No significant bladder findings. 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 No small bowel abnormalities are identified status post right hemicolectomy and anastomosis. There are diverticular changes in the descending and sigmoid colon with possible mild sigmoid colon wall thickening, but no surrounding inflammation or significant distension.  Vascular/Lymphatic: There are no enlarged abdominal or pelvic lymph nodes. Moderate aortic and branch vessel atherosclerosis without acute vascular findings. There is mild reflux of contrast into the IVC and hepatic veins. The portal, superior mesenteric and splenic veins are patent. Reproductive: Hysterectomy. No adnexal mass. Stable curvilinear  calcification between the vagina and rectum. Other: Intact abdominal wall. No ascites or peritoneal nodularity. Stable calcification in the left lower quadrant anterior to the sigmoid colon. Musculoskeletal: No acute or significant osseous findings. Review of the MIP images confirms the above findings. IMPRESSION: 1. No evidence of acute pulmonary embolism or other acute chest process. 2. Known hypermetabolic left lung nodules consistent with metastatic disease. The dominant perihilar lingular component has mildly enlarged. 3. No acute findings in the abdomen or pelvis. 4. Bilateral nephrolithiasis and renal cortical thinning consistent with chronic renal failure. 5. Aortic Atherosclerosis (ICD10-I70.0) and Emphysema (ICD10-J43.9). Electronically Signed   By: Richardean Sale M.D.   On: 11/18/2019 13:16   DG Chest Port 1 View  Result Date: 11/18/2019 CLINICAL DATA:  Former smoker with chest pain. History of emphysema. Hypertension. EXAM: PORTABLE CHEST 1 VIEW COMPARISON:  CT chest 10/18/2019 FINDINGS: Normal heart size. Pulmonary vascular congestion. No pleural effusion or overt edema. No acute airspace consolidation. Left mid lung nodule corresponding to FDG avid 2.3 cm left upper lobe lung nodule seen on previous CT of the chest is unchanged and remains worrisome for primary bronchogenic carcinoma. The lungs appear hyperinflated and there are diffuse interstitial changes compatible with emphysema. IMPRESSION: 1. Pulmonary vascular congestion without overt edema or pleural effusion. 2. Left upper lobe lung nodule is unchanged and remains worrisome for primary bronchogenic carcinoma. 3.  Emphysema (ICD10-J43.9). Electronically Signed   By: Kerby Moors M.D.   On: 11/18/2019 11:17    Procedures Procedures (including critical care time)  Medications Ordered in ED Medications  sodium chloride 0.9 % bolus 500 mL (0 mLs Intravenous Stopped 11/18/19 1145)  alum & mag hydroxide-simeth (MAALOX/MYLANTA)  200-200-20 MG/5ML suspension 30 mL (30 mLs Oral Given 11/18/19 1118)  ondansetron (ZOFRAN) injection 4 mg (4 mg Intravenous Given 11/18/19 1056)  dextrose 50 % solution 50 mL (50 mLs Intravenous Given 11/18/19 1045)  iohexol (OMNIPAQUE) 350 MG/ML injection 100 mL (100 mLs Intravenous Contrast Given 11/18/19 1212)  fentaNYL (SUBLIMAZE) injection 25 mcg (25 mcg Intravenous Given 11/18/19 1314)    ED Course  I have reviewed the triage vital signs and the nursing notes.  Pertinent labs & imaging results that were available during my care of the patient were reviewed by me and considered in my medical decision making (see chart for details).    MDM Rules/Calculators/A&P                          74 yo F with a chief complaints of epigastric abdominal pain.  This woke her up from sleep.  Caused her to have episodes of vomiting and then she felt like she may pass out.  EMS found her to have soft blood pressures, they ended up giving her a bolus of diltiazem that had some transient improvement of her blood pressure and also resolution of her tachycardia.  They felt like on the monitor she was having atrial fibrillation with rapid ventricular response.  On arrival here the patient is in a normal sinus rhythm with a rate in the 70s.  Unfortunately she is still significantly uncomfortable in her epigastrium.  Blood pressure is initially in the 09X systolic  though maps seem to be above 65.  Will obtain a laboratory evaluation CT scan of the chest abdomen pelvis small bolus of IV fluids.  CT scan of the chest abdomen pelvis without obvious cause of the patient's symptoms.  Her troponin is mildly elevated above her baseline.  On reassessment her pain has migrated and after GI cocktail her pain is no longer in her epigastrium but more in the right side of her abdomen.  Seems much less likely to be primary cardiogenic cause.  The patient's blood pressure improved with IV fluids.  As the patient had transient hypotension  of unknown etiology and still having ongoing symptoms we will discuss with medicine for observation.  CRITICAL CARE Performed by: Cecilio Asper   Total critical care time: 35 minutes  Critical care time was exclusive of separately billable procedures and treating other patients.  Critical care was necessary to treat or prevent imminent or life-threatening deterioration.  Critical care was time spent personally by me on the following activities: development of treatment plan with patient and/or surrogate as well as nursing, discussions with consultants, evaluation of patient's response to treatment, examination of patient, obtaining history from patient or surrogate, ordering and performing treatments and interventions, ordering and review of laboratory studies, ordering and review of radiographic studies, pulse oximetry and re-evaluation of patient's condition.    The patients results and plan were reviewed and discussed.   Any x-rays performed were independently reviewed by myself.   Differential diagnosis were considered with the presenting HPI.  Medications  sodium chloride 0.9 % bolus 500 mL (0 mLs Intravenous Stopped 11/18/19 1145)  alum & mag hydroxide-simeth (MAALOX/MYLANTA) 200-200-20 MG/5ML suspension 30 mL (30 mLs Oral Given 11/18/19 1118)  ondansetron (ZOFRAN) injection 4 mg (4 mg Intravenous Given 11/18/19 1056)  dextrose 50 % solution 50 mL (50 mLs Intravenous Given 11/18/19 1045)  iohexol (OMNIPAQUE) 350 MG/ML injection 100 mL (100 mLs Intravenous Contrast Given 11/18/19 1212)  fentaNYL (SUBLIMAZE) injection 25 mcg (25 mcg Intravenous Given 11/18/19 1314)    Vitals:   11/18/19 1019 11/18/19 1103  Temp: 97.8 F (36.6 C)   TempSrc: Oral   Weight:  50.7 kg  Height:  5' 4"  (1.626 m)    Final diagnoses:  Epigastric pain  Hypotension determined by examination    Admission/ observation were discussed with the admitting physician, patient and/or family and they are  comfortable with the plan.    Final Clinical Impression(s) / ED Diagnoses Final diagnoses:  Epigastric pain  Hypotension determined by examination    Rx / DC Orders ED Discharge Orders    None       Deno Etienne, DO 11/18/19 1340

## 2019-11-18 NOTE — ED Notes (Signed)
Dr Tamala Julian notified of increasing troponin.

## 2019-11-18 NOTE — H&P (Addendum)
History and Physical    Natasha [name] JQZ:[id number] DOB: 1945/07/10 DOA: 11/18/2019  Referring MD/NP/PA: Deno Etienne, MD PCP: [name], MD  Patient coming from: Home via EMS  Chief Complaint: Abdominal pain  I have personally briefly reviewed patient's old medical records in [place]   HPI: Natasha [name] is a 74 y.o. 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  This morning she reports feeling awoken out of her sleep several times with episodes of diarrhea.  Then subsequently was woken out of her sleep with severe epigastric pain that moved down into her right lower quadrant and radiated to her back.  Associated symptoms included nausea, nonbloody emesis, dizziness, and shortness of breath.  She tried taking antibiotic without relief.  Her husband had to turn her oxygen from 3 L to 4 without any improvement in symptoms.  EMS was called due to the severity of her symptoms.  EMS noted that the patient appeared to be in atrial fibrillation with heart rates into the 140s and she was given a bolus of diltiazem.    Patient admits that on 5/25 she had gone to the hospital for CT scan of her chest and was noted to be in a regular heart rhythm.  Records show that patient was in SVT and ultimately required adenosine 6 mg to go back into sinus rhythm.  ED Course: Patient was noted to be afebrile with blood pressures initially reported into the 70s and back in sinus rhythm.  Blood pressures improved with 500 mL bolus.  Initial blood glucose was 58 for which patient was given an amp of D50 prior to initial lab work.  Labs significant for hemoglobin 10.6, platelets 102, potassium 3.2, BUN 36, creatinine 7.98, glucose 194, lipase 56, albumin 2.6, troponin 46, and lactic acid  1.6.  CT scan of the chest, abdomen, and pelvis showed hypermetabolic left lung nodules consistent with metastatic disease, but no other cause for patient's symptoms.  Blood sugars were noted to drop back down to 55 and patient was given an amp of D50.  TRH called to admit.  Review of Systems  Constitutional: Positive for malaise/fatigue.  HENT: Negative for congestion and nosebleeds.   Eyes: Negative for photophobia and pain.  Respiratory: Positive for shortness of breath. Negative for cough.   Cardiovascular: Negative for chest pain and leg swelling.  Gastrointestinal: Positive for abdominal pain, diarrhea, nausea and vomiting.  Genitourinary: Negative for dysuria.  Musculoskeletal: Positive for back pain. Negative for falls.  Neurological: Positive for dizziness. Negative for seizures and loss of consciousness.  Psychiatric/Behavioral: Negative for memory loss and substance abuse.    Past Medical History:  Diagnosis Date  . Anal fistula   . Anemia in chronic kidney disease (CKD)   . Aortic insufficiency    Echo 3/18: mod conc LVH, EF 60-65, no RWMA, Gr 1 DD, mod AI, mild MR, normal RVSF, Trivial TR  . Arthritis   . Borderline hypertension   . Bulging disc    L3-L4  . Chronic diarrhea    due to crohn's  . CKD (chronic kidney disease), stage IV ([place])    MWF- [address]  . Crohn's disease ([place])    chronic ileitis  . Dyspnea    with exertion  . Emphysema/COPD ([place])   . GERD (gastroesophageal reflux disease)  denies  . History of blood transfusion   . History of glaucoma   . History of kidney stones   . History of pancreatitis    2008--  mercaptopurine  . History of small bowel obstruction    12-03-2010  due to crohn's ileitis  . Hypertension    no longer on medications  . Hypothyroidism, postsurgical    multinodule w/ hurthle cells  . Osteoporosis   . PAF (paroxysmal atrial fibrillation) ([place])   . Perianal Crohn's disease ([place])   . Peripheral vascular disease  ([place])    blood clot behind knee left leg  . Polyarthralgia   . Seizures ([place])    03/2017  . Wears partial dentures    upper    Past Surgical History:  Procedure Laterality Date  . ABDOMINAL HYSTERECTOMY  1990   and  Appendectomy  . AV FISTULA PLACEMENT Left 12/08/2017   Procedure: INSERTION OF ARTERIOVENOUS (AV) GRAFT WITH ARTEGRAFT TO LEFT UPPER ARM;  Surgeon: Conrad Valdese, MD;  Location: [place];  Service: Vascular;  Laterality: Left;  . BASCILIC VEIN TRANSPOSITION Left 01/09/2017   Procedure: LEFT 1ST STAGE BRACHIAL VEIN TRANSPOSITION;  Surgeon: Conrad Pun[place]a, MD;  Location: [place];  Service: Vascular;  Laterality: Left;  . BASCILIC VEIN TRANSPOSITION Left 06/16/2017   Procedure: Second Stage BASILIC VEIN TRANSPOSITION  LEFT ARM;  Surgeon: Conrad Liebenthal, MD;  Location: [place];  Service: Vascular;  Laterality: Left;  . BIOPSY  03/09/2018   Procedure: BIOPSY;  Surgeon: Wilford Corner, MD;  Location: [facility];  Service: Endoscopy;;  . CHOLECYSTECTOMY    . COLON RESECTION  x3 --  1978,  1987, 1989   ILEAL RESECTION x2/   [place]  . COLONOSCOPY WITH PROPOFOL N/A 09/25/2014   Procedure: COLONOSCOPY WITH PROPOFOL;  Surgeon: Garlan Fair, MD;  [facility]: [facility];  Service: Endoscopy;  Laterality: N/A;  . COLONOSCOPY WITH PROPOFOL N/A 03/09/2018   Procedure: COLONOSCOPY WITH PROPOFOL;  Surgeon: Wilford Corner, MD;  [facility];  Service: Endoscopy;  Laterality: N/A;  . CYSTOSCOPY W/ URETERAL STENT PLACEMENT Right 11/19/2016   Procedure: CYSTOSCOPY WITH RIGHT RETROGRADE PYELOGRAM/ URETEROSCOPY WITH LASER AND RIGHT URETERAL STENT PLACEMENT;  Surgeon: Ardis Hughs, MD;  Location: [facility];  Service: Urology;  Laterality: Right;  . ESOPHAGOGASTRODUODENOSCOPY  03/04/2012   Procedure: ESOPHAGOGASTRODUODENOSCOPY (EGD);  Surgeon: Garlan Fair, MD;  Location: [facility];  Service: Endoscopy;  Laterality: N/A;  . EXAMINATION UNDER ANESTHESIA N/A 09/12/2014     Procedure: EXAM UNDER ANESTHESIA;  Surgeon: Rolm Bookbinder, MD;  Location: [place];  Service: General;  Laterality: N/A;  . EYE SURGERY     laser  . FLEXIBLE SIGMOIDOSCOPY  03/04/2012   Procedure: FLEXIBLE SIGMOIDOSCOPY;  Surgeon: Garlan Fair, MD;  [facility];  Service: Endoscopy;  Laterality: N/A;  . GLAUCOMA SURGERY Bilateral   . HOLMIUM LASER APPLICATION Right 10/11/8414   Procedure: HOLMIUM LASER APPLICATION;  Surgeon: Ardis Hughs, MD;  [facility];  Service: Urology;  Laterality: Right;  . INCISION AND DRAINAGE PERIRECTAL ABSCESS N/A 09/12/2014   Procedure: IRRIGATION AND DEBRIDEMENT PERIRECTAL ABSCESS;  Surgeon: Rolm Bookbinder, MD;  Location: [place];  Service: General;  Laterality: N/A;  . IR FLUORO GUIDE CV LINE RIGHT  03/20/2017  . IR US GUIDE VASC ACCESS RIGHT  03/20/2017  . NEGATIVE SLEEP STUDY  2008  . PLACEMENT OF SETON N/A 12/01/2014   Procedure: PLACEMENT OF SETON;  Surgeon: [name], MD;  Location: [place] [facility];  Service: General;  Laterality: N/A;  . POLYPECTOMY  03/09/2018   Procedure: POLYPECTOMY;  Surgeon: Wilford Corner, MD;  [facility];  Service: Endoscopy;;  . RECTAL EXAM UNDER ANESTHESIA N/A 12/01/2014   Procedure: RECTAL EXAM UNDER ANESTHESIA;  Surgeon: [name], MD;  Location: [place];  Service: General;  Laterality: N/A;  . TOTAL THYROIDECTOMY  10-13-2003  . TRANSTHORACIC ECHOCARDIOGRAM  07-11-2013   mild LVH,  ef 55%,  moderate AR,  mild MR and TR,  trivial PR  . TUBAL LIGATION       reports that she quit smoking about 1 years ago. Her smoking use included cigarettes. She started smoking about 52 years ago. She has a 35.00 pack-year smoking history. She has never used smokeless tobacco. She reports that she does not drink alcohol and does not use drugs.  Allergies  Allergen Reactions  . Mercaptopurine Other (See Comments)    Caused pancreatitis  . Remicade [Infliximab]  Other (See Comments)    CAUSED JOINT PAIN    Family History  Problem Relation Age of Onset  . Hypertension Father   . Heart disease Father   . Heart attack Father   . Diverticulitis Mother   . COPD Mother   . Hypertension Mother   . Heart disease Mother   . Heart attack Mother   . Heart attack Brother   . Colon cancer Brother   . Diabetes Brother   . Thyroid disease Brother     Prior to Admission medications   Medication Sig Start Date End Date Taking? Authorizing Provider  acetaminophen (TYLENOL) 500 MG tablet Take 1,000 mg by mouth every 6 (six) hours as needed for mild pain.     [provider]  Adalimumab (HUMIRA) 40 MG/0.8ML PSKT Inject 40 mg into the skin every 14 (fourteen) days.     [provider]  albuterol (VENTOLIN HFA) 108 (90 Base) MCG/ACT inhaler INHALE 2 PUFFS BY MOUTH EVERY 6 HOURS IF NEEDED FOR WHEEZING OR SHORTNESS OF BREATH 05/31/19   Chesley Mires, MD  alendronate (FOSAMAX) 70 MG tablet Take 70 mg by mouth every Monday.  11/06/16   [provider]  calcitRIOL (ROCALTROL) 0.25 MCG capsule Take 0.25 mcg by mouth every Monday, Wednesday, and Friday with hemodialysis. 02/03/17   [provider]  calcium acetate (PHOSLO) 667 MG capsule Take 1 capsule (667 mg total) by mouth 3 (three) times daily with meals. 06/06/18   Domenic Polite, MD  calcium-vitamin D (OSCAL WITH D) 500-200 MG-UNIT TABS tablet Take 1 tablet by mouth as needed (with dialysis).     [provider]  cyanocobalamin (,VITAMIN B-12,) 1000 MCG/ML injection Inject 1,000 mcg into the muscle every 30 (thirty) days.     [provider]  ELIQUIS 5 MG TABS tablet Take 2.5 mg by mouth 2 (two) times daily. Takes 1/2 tablet (2.65m) bid 08/23/18   [provider]  fluticasone (FLONASE) 50 MCG/ACT nasal spray Place 1-2 sprays into both nostrils daily. 03/19/19   [provider]  Fluticasone-Umeclidin-Vilant (TRELEGY ELLIPTA) 100-62.5-25 MCG/INH AEPB  Inhale 1 puff into the lungs daily. 07/25/19   SChesley Mires MD  Fluticasone-Umeclidin-Vilant (TRELEGY ELLIPTA) 100-62.5-25 MCG/INH AEPB Inhale 1 puff into the lungs daily. 11/17/19   WMartyn Ehrich NP  heparin 1000 UNIT/ML injection Inject 1,000 Units into the vein 3 (three) times a week. 12/01/18 11/30/19  [provider]  ipratropium-albuterol (DUONEB) 0.5-2.5 (3) MG/3ML SOLN Take 3 mLs by  nebulization every 6 (six) hours as needed. 12/30/18   Fenton Foy, NP  levothyroxine (SYNTHROID, LEVOTHROID) 125 MCG tablet Take 125 mcg by mouth daily before breakfast.     [provider]  lidocaine-prilocaine (EMLA) cream Apply 1 application topically every Monday, Wednesday, and Friday.  11/29/18   [provider]  loperamide (IMODIUM) 2 MG capsule Take 2 capsules (4 mg total) by mouth as needed for diarrhea or loose stools. 03/27/17   Caren Griffins, MD  Methoxy PEG-Epoetin Beta (MIRCERA IJ) Inject 150 mg as directed every 14 (fourteen) days. 06/13/19 06/11/20  [provider]  metoprolol tartrate (LOPRESSOR) 25 MG tablet Take 1 tablet (25 mg total) by mouth 2 (two) times daily. 11/16/19 02/14/20  Nahser, Wonda Cheng, MD  midodrine (PROAMATINE) 5 MG tablet Take 5 mg by mouth every Monday, Wednesday, and Friday.  04/30/17   [provider]  nitroGLYCERIN (NITROSTAT) 0.4 MG SL tablet Place 1 tablet (0.4 mg total) under the tongue every 5 (five) minutes as needed for chest pain. 05/03/19 11/16/19  Nahser, Wonda Cheng, MD  ondansetron (ZOFRAN ODT) 4 MG disintegrating tablet Take 1 tablet (4 mg total) by mouth every 8 (eight) hours as needed for nausea or vomiting. 03/29/18   Deno Etienne, DO  OXYGEN Inhale 2-3 L into the lungs See admin instructions. Use every night and during the day as needed for shortness of breath    [provider]  predniSONE (DELTASONE) 5 MG tablet Take 5 mg by mouth daily with breakfast.    [provider]  Respiratory Therapy Supplies  (NEBULIZER) DEVI 1 Device by Does not apply route as needed. 12/07/18   Fenton Foy, NP  traMADol (ULTRAM) 50 MG tablet Take 50 mg by mouth as needed. 01/09/17   [provider]  zinc sulfate 220 (50 Zn) MG capsule Take 1 capsule (220 mg total) by mouth daily. 04/17/19   Elodia Florence., MD    Physical Exam:  Constitutional: Elderly female currently in no acute distress Vitals:   11/18/19 1019 11/18/19 1103  Temp: 97.8 F (36.6 C)   TempSrc: Oral   Weight:  50.7 kg  Height:  5' 4"  (1.626 m)   Eyes: PERRL, lids and conjunctivae normal ENMT: Mucous membranes are dry. Posterior pharynx clear of any exudate or lesions.  Neck: normal, supple, no masses, no thyromegaly Respiratory: clear to auscultation bilaterally, no wheezing, no crackles. Normal respiratory effort. No accessory muscle use.  Cardiovascular: Regular rate and rhythm, no murmurs / rubs / gallops. No extremity edema. 2+ pedal pulses. No carotid bruits.  Abdomen: no tenderness, no masses palpated. No hepatosplenomegaly. Bowel sounds positive.  Musculoskeletal: no clubbing / cyanosis. No joint deformity upper and lower extremities. Good ROM, no contractures. Normal muscle tone.  Skin: no rashes, lesions, ulcers. No induration Neurologic: CN 2-12 grossly intact. Sensation intact, DTR normal. Strength 5/5 in all 4.  Psychiatric: Normal judgment and insight. Alert and oriented x 3. Normal mood.     Labs on Admission: I have personally reviewed following labs and imaging studies  CBC: Recent Labs  Lab 11/15/19 1317 11/18/19 1119  WBC 5.1 4.2  NEUTROABS 1.9  --   HGB 11.6* 10.6*  HCT 38.3 36.5  MCV 105.5* 108.6*  PLT 112* 144*   Basic Metabolic Panel: Recent Labs  Lab 11/15/19 1317 11/18/19 1119  NA 140 138  K 3.0* 3.2*  CL 103 105  CO2 24 20*  GLUCOSE 75 194*  BUN 23  36*  CREATININE 6.57* 7.98*  CALCIUM 8.7* 8.3*   GFR: Estimated Creatinine Clearance: 5 mL/min (A) (by C-G formula based  on SCr of 7.98 mg/dL (H)). Liver Function Tests: Recent Labs  Lab 11/15/19 1317 11/18/19 1119  AST 16 18  ALT 10 11  ALKPHOS 82 60  BILITOT 0.6 0.2*  PROT 6.8 5.6*  ALBUMIN 3.1* 2.6*   Recent Labs  Lab 11/18/19 1119  LIPASE 56*   No results for input(s): AMMONIA in the last 168 hours. Coagulation Profile: No results for input(s): INR, PROTIME in the last 168 hours. Cardiac Enzymes: No results for input(s): CKTOTAL, CKMB, CKMBINDEX, TROPONINI in the last 168 hours. BNP (last 3 results) No results for input(s): PROBNP in the last 8760 hours. HbA1C: No results for input(s): HGBA1C in the last 72 hours. CBG: Recent Labs  Lab 11/18/19 1042  GLUCAP 58*   Lipid Profile: No results for input(s): CHOL, HDL, LDLCALC, TRIG, CHOLHDL, LDLDIRECT in the last 72 hours. Thyroid Function Tests: No results for input(s): TSH, T4TOTAL, FREET4, T3FREE, THYROIDAB in the last 72 hours. Anemia Panel: No results for input(s): VITAMINB12, FOLATE, FERRITIN, TIBC, IRON, RETICCTPCT in the last 72 hours. Urine analysis:    Component Value Date/Time   COLORURINE YELLOW 03/19/2017 1934   APPEARANCEUR HAZY (A) 03/19/2017 1934   LABSPEC 1.006 03/19/2017 1934   PHURINE 5.0 03/19/2017 1934   GLUCOSEU NEGATIVE 03/19/2017 1934   HGBUR MODERATE (A) 03/19/2017 1934   BILIRUBINUR NEGATIVE 03/19/2017 [place] 03/19/2017 1934   PROTEINUR 30 (A) 03/19/2017 1934   UROBILINOGEN 0.2 09/09/2014 0850   NITRITE NEGATIVE 03/19/2017 1934   LEUKOCYTESUR NEGATIVE 03/19/2017 1934   Sepsis Labs: No results found for this or any previous visit (from the past 240 hour(s)).   Radiological Exams on Admission: CT Angio Chest PE W and/or Wo Contrast  Result Date: 11/18/2019 CLINICAL DATA:  Shortness of breath with nausea, dizziness and abdominal pain. Atrial fibrillation with rapid ventricular response. Clinical concern for acute pulmonary embolism. Chronic kidney disease on hemodialysis. EXAM: CT  ANGIOGRAPHY CHEST CT ABDOMEN AND PELVIS WITH CONTRAST TECHNIQUE: Multidetector CT imaging of the chest was performed using the standard protocol during bolus administration of intravenous contrast. Multiplanar CT image reconstructions and MIPs were obtained to evaluate the vascular anatomy. Multidetector CT imaging of the abdomen and pelvis was performed using the standard protocol during bolus administration of intravenous contrast. CONTRAST:  110m OMNIPAQUE IOHEXOL 350 MG/ML SOLN COMPARISON:  PET-CT 11/01/2019. Chest CT 10/18/2019 and abdominopelvic CT 09/20/2019. FINDINGS: CTA CHEST FINDINGS Cardiovascular: The pulmonary arteries are well opacified with contrast to the level of the subsegmental branches. There is no evidence of acute pulmonary embolism. Stable mild central enlargement of the pulmonary arteries and mild atherosclerosis of the aorta, great vessels and coronary arteries. Left axillary and brachial stent noted. The heart size is normal. There is no pericardial effusion. Mediastinum/Nodes: Mildly prominent left hilar lymph nodes are grossly stable. There are no enlarged mediastinal or hilar lymph nodes. Previous thyroidectomy. The trachea and esophagus demonstrate no significant findings. Lungs/Pleura: No pleural effusion or pneumothorax. Mild dependent atelectasis at both lung bases. Moderate centrilobular and paraseptal emphysema. Known hypermetabolic left lung nodules are again noted with slight enlargement of the dominant perihilar lingular component measuring up to 2.2 cm on image 41/7 (previously 2.0 cm). Additional nodules are present the left lung, including a 9 mm left upper lobe nodule on image 24/7 and a spiculated left upper lobe nodule measuring 10 mm on  image 26/7. No suspicious right lung nodules. Musculoskeletal/Chest wall: Inferior endplate lucency and surrounding sclerosis again noted within the T10 vertebral body. Although hypermetabolic on prior PET-CT, the appearance is most  typical of a Schmorl's node. No acute osseous findings. CT ABDOMEN AND PELVIS FINDINGS Hepatobiliary: The liver is normal in density without suspicious focal abnormality. Probable focal fat adjacent to the falciform ligament. Stable chronic mild intrahepatic and extrahepatic biliary dilatation post cholecystectomy. 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 No evidence of pancreatic mass or surrounding inflammation. Spleen: Normal in size without focal abnormality. Adrenals/Urinary Tract: Both adrenal glands appear normal. Numerous bilateral renal calculi and low-density renal lesions are again noted, similar to previous study. There is bilateral renal cortical thinning and absent contrast excretion consistent with chronic renal failure. No hydronephrosis. No significant bladder findings. 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 No small bowel abnormalities are identified status post right hemicolectomy and anastomosis. There are diverticular changes in the descending and sigmoid colon with possible mild sigmoid colon wall thickening, but no surrounding inflammation or significant distension. Vascular/Lymphatic: There are no enlarged abdominal or pelvic lymph nodes. Moderate aortic and branch vessel atherosclerosis without acute vascular findings. There is mild reflux of contrast into the IVC and hepatic veins. The portal, superior mesenteric and splenic veins are patent. Reproductive: Hysterectomy. No adnexal mass. Stable curvilinear calcification between the vagina and rectum. Other: Intact abdominal wall. No ascites or peritoneal nodularity. Stable calcification in the left lower quadrant anterior to the sigmoid colon. Musculoskeletal: No acute or significant osseous findings. Review of the MIP images confirms the above findings. IMPRESSION: 1. No evidence of acute pulmonary embolism or other acute chest  process. 2. Known hypermetabolic left lung nodules consistent with metastatic disease. The dominant perihilar lingular component has mildly enlarged. 3. No acute findings in the abdomen or pelvis. 4. Bilateral nephrolithiasis and renal cortical thinning consistent with chronic renal failure. 5. Aortic Atherosclerosis (ICD10-I70.0) and Emphysema (ICD10-J43.9). Electronically Signed   By: Richardean Sale M.D.   On: 11/18/2019 13:16   CT ABDOMEN PELVIS W CONTRAST  Result Date: 11/18/2019 CLINICAL DATA:  Shortness of breath with nausea, dizziness and abdominal pain. Atrial fibrillation with rapid ventricular response. Clinical concern for acute pulmonary embolism. Chronic kidney disease on hemodialysis. EXAM: CT ANGIOGRAPHY CHEST CT ABDOMEN AND PELVIS WITH CONTRAST TECHNIQUE: Multidetector CT imaging of the chest was performed using the standard protocol during bolus administration of intravenous contrast. Multiplanar CT image reconstructions and MIPs were obtained to evaluate the vascular anatomy. Multidetector CT imaging of the abdomen and pelvis was performed using the standard protocol during bolus administration of intravenous contrast. CONTRAST:  130m OMNIPAQUE IOHEXOL 350 MG/ML SOLN COMPARISON:  PET-CT 11/01/2019. Chest CT 10/18/2019 and abdominopelvic CT 09/20/2019. FINDINGS: CTA CHEST FINDINGS Cardiovascular: The pulmonary arteries are well opacified with contrast to the level of the subsegmental branches. There is no evidence of acute pulmonary embolism. Stable mild central enlargement of the pulmonary arteries and mild atherosclerosis of the aorta, great vessels and coronary arteries. Left axillary and brachial stent noted. The heart size is normal. There is no pericardial effusion. Mediastinum/Nodes: Mildly prominent left hilar lymph nodes are grossly stable. There are no enlarged mediastinal or hilar lymph nodes. Previous thyroidectomy. The trachea and esophagus demonstrate no significant findings.  Lungs/Pleura: No pleural effusion or pneumothorax. Mild dependent atelectasis at both lung bases. Moderate centrilobular and paraseptal emphysema. Known hypermetabolic left lung nodules are again noted with slight enlargement  of the dominant perihilar lingular component measuring up to 2.2 cm on image 41/7 (previously 2.0 cm). Additional nodules are present the left lung, including a 9 mm left upper lobe nodule on image 24/7 and a spiculated left upper lobe nodule measuring 10 mm on image 26/7. No suspicious right lung nodules. Musculoskeletal/Chest wall: Inferior endplate lucency and surrounding sclerosis again noted within the T10 vertebral body. Although hypermetabolic on prior PET-CT, the appearance is most typical of a Schmorl's node. No acute osseous findings. CT ABDOMEN AND PELVIS FINDINGS Hepatobiliary: The liver is normal in density without suspicious focal abnormality. Probable focal fat adjacent to the falciform ligament. Stable chronic mild intrahepatic and extrahepatic biliary dilatation post cholecystectomy. 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 No evidence of pancreatic mass or surrounding inflammation. Spleen: Normal in size without focal abnormality. Adrenals/Urinary Tract: Both adrenal glands appear normal. Numerous bilateral renal calculi and low-density renal lesions are again noted, similar to previous study. There is bilateral renal cortical thinning and absent contrast excretion consistent with chronic renal failure. No hydronephrosis. No significant bladder findings. 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 No small bowel abnormalities are identified status post right hemicolectomy and anastomosis. There are diverticular changes in the descending and sigmoid colon with possible mild sigmoid colon wall thickening, but no surrounding inflammation or significant distension.  Vascular/Lymphatic: There are no enlarged abdominal or pelvic lymph nodes. Moderate aortic and branch vessel atherosclerosis without acute vascular findings. There is mild reflux of contrast into the IVC and hepatic veins. The portal, superior mesenteric and splenic veins are patent. Reproductive: Hysterectomy. No adnexal mass. Stable curvilinear calcification between the vagina and rectum. Other: Intact abdominal wall. No ascites or peritoneal nodularity. Stable calcification in the left lower quadrant anterior to the sigmoid colon. Musculoskeletal: No acute or significant osseous findings. Review of the MIP images confirms the above findings. IMPRESSION: 1. No evidence of acute pulmonary embolism or other acute chest process. 2. Known hypermetabolic left lung nodules consistent with metastatic disease. The dominant perihilar lingular component has mildly enlarged. 3. No acute findings in the abdomen or pelvis. 4. Bilateral nephrolithiasis and renal cortical thinning consistent with chronic renal failure. 5. Aortic Atherosclerosis (ICD10-I70.0) and Emphysema (ICD10-J43.9). Electronically Signed   By: Richardean Sale M.D.   On: 11/18/2019 13:16   DG Chest Port 1 View  Result Date: 11/18/2019 CLINICAL DATA:  Former smoker with chest pain. History of emphysema. Hypertension. EXAM: PORTABLE CHEST 1 VIEW COMPARISON:  CT chest 10/18/2019 FINDINGS: Normal heart size. Pulmonary vascular congestion. No pleural effusion or overt edema. No acute airspace consolidation. Left mid lung nodule corresponding to FDG avid 2.3 cm left upper lobe lung nodule seen on previous CT of the chest is unchanged and remains worrisome for primary bronchogenic carcinoma. The lungs appear hyperinflated and there are diffuse interstitial changes compatible with emphysema. IMPRESSION: 1. Pulmonary vascular congestion without overt edema or pleural effusion. 2. Left upper lobe lung nodule is unchanged and remains worrisome for primary  bronchogenic carcinoma. 3.  Emphysema (ICD10-J43.9). Electronically Signed   By: Kerby Moors M.D.   On: 11/18/2019 11:17    EKG: Independently reviewed.  Sinus rhythm at 72 bpm  Assessment/Plan  Epigastric abdominal pain Nausea, vomiting, diarrhea: Acute.  Patient present with complaints of epigastric abdominal pain with complaints of nausea, vomiting, and diarrhea.  CT scan at the chest, abdomen, and pelvis did not show any clear cause of patient's symptoms.  Question of  possibility of of a gastroenteritis -Admit to a medical telemetry bed -Strict intake and output -GI cocktail as needed for heartburn or indigestion -Check GI panel and C. difficile if diarrhea persist  Elevated troponin Arrhythmia: Acute. Reported by EMS to be in A. fib with RVR for which she was given diltiazem and converted back into sinus rhythm.  High-sensitivity elevated at 46->95.  Patient was seen to go into SVT just last month -Trend cardiac troponin -Check echocardiogram [facility] cardiology consulted  Hypoglycemia: Acute.  Patient is not on any diabetic medications.  Initial blood sugars noted to be as low as 55 despite being given an amp of dextrose. -Give additional amp of dextrose -D10 IV fluids at 50 mL/h  Transient hypotension: Systolic blood pressures initially noted to be in the 70s.  Patient's blood pressures improved with 500 mill liter IV fluid bolus. -Continue midodrine -IV fluids as seen above  ESRD on HD: Patient normally dialyzes Monday, Wednesday, Friday.  Last dialyzed on 6/23.  Labs significant for potassium 3.2, BUN 36, and creatinine 7.98. -Continue current home medication regimen -Nephrology consulted, we will follow-up for any further recommendations  Hypokalemia: Acute.  Initial potassium noted to be 3.2 on admission.  Suspect secondary to recent episodes of diarrhea. -Give 30 mEq x 1 dose -Continue to monitor and replace as needed  Stage IV lung cancer: Patient being followed in the  outpatient setting by oncology for concerns for stage IV cancer with left upper lobe nodule, left hilar/suprahilar nodal metastases, and metastatic osseous lesions to T10/T11.  PET scan from 6/8 revealed 2.3 x 1.9 cm central left upper lobe mass with SUV max of 16.2 compatible with primary bronchogenic neoplasm.  Patient was thought to be at high risk for complications from bronchoscopy. . -Continue outpatient follow-up with oncology and pulmonology  Crohn's disease: Patient on chronic prednisone and Humira. -Continue prednisone.  COPD/emphysema Chronic respiratory failure: Patient O2 saturations currently maintained on home regimen of 3 L of oxygen -Continuous pulse oximetry with nasal cannula oxygen -Continue Trelegy  -DuoNebs as needed for shortness of breath/wheezing  Macrocytic anemia: Hemoglobin 10.6 with elevated MCV which appears near patient's baseline.  Denies any reports of bleeding. -Continue to monitor  Hypothyroidism -Add on TSH -Continue levothyroxine   Thrombocytopenia: Chronic.  Platelet count 102 which appears near patient's baseline. -Continue to monitor  History of COVID-19 virus infection  DVT prophylaxis: Eliquis Code Status: Full Family Communication: Husband updated over the phone Disposition Plan: Possible discharge in 1 to 2 days Consults called: None Admission status: Observation   Norval Morton MD [facility] Pager [phone]   If 7PM-7AM, please contact night-coverage [email or web address] Password [email or web address]  11/18/2019, 1:34 PM

## 2019-11-18 NOTE — ED Triage Notes (Signed)
Pt here from home via [facility].  Called 911 for sob, nausea, dizziness and stomach pain.  Initial hr was 175 afib rvr with a pt in the 80's.  Given 10 mg cardizem bolus and placed on drip of 5 mg/hr.  HR 76 nsr and bp increased to 90's per ems with 250 ns bolus.  Upon arrival, hr 76 nsr and bp 77/60.  Pt ao x 4.

## 2019-11-18 NOTE — ED Notes (Signed)
Pt states epigastrum now feels better.  Hoever, RLQ now 8/10.  MD notified.

## 2019-11-19 ENCOUNTER — Observation Stay (HOSPITAL_COMMUNITY): Payer: Medicare Other

## 2019-11-19 ENCOUNTER — Other Ambulatory Visit (HOSPITAL_COMMUNITY): Payer: Medicare Other

## 2019-11-19 DIAGNOSIS — R1013 Epigastric pain: Secondary | ICD-10-CM | POA: Diagnosis not present

## 2019-11-19 DIAGNOSIS — R778 Other specified abnormalities of plasma proteins: Secondary | ICD-10-CM | POA: Diagnosis present

## 2019-11-19 DIAGNOSIS — I499 Cardiac arrhythmia, unspecified: Secondary | ICD-10-CM | POA: Diagnosis present

## 2019-11-19 LAB — CBC
HCT: 35.8 % — ABNORMAL LOW (ref 36.0–46.0)
Hemoglobin: 10.4 g/dL — ABNORMAL LOW (ref 12.0–15.0)
MCH: 31 pg (ref 26.0–34.0)
MCHC: 29.1 g/dL — ABNORMAL LOW (ref 30.0–36.0)
MCV: 106.5 fL — ABNORMAL HIGH (ref 80.0–100.0)
Platelets: 111 10*3/uL — ABNORMAL LOW (ref 150–400)
RBC: 3.36 MIL/uL — ABNORMAL LOW (ref 3.87–5.11)
RDW: 14.6 % (ref 11.5–15.5)
WBC: 5.6 10*3/uL (ref 4.0–10.5)
nRBC: 0 % (ref 0.0–0.2)

## 2019-11-19 LAB — RENAL FUNCTION PANEL
Albumin: 2.5 g/dL — ABNORMAL LOW (ref 3.5–5.0)
Anion gap: 13 (ref 5–15)
BUN: 41 mg/dL — ABNORMAL HIGH (ref 8–23)
CO2: 20 mmol/L — ABNORMAL LOW (ref 22–32)
Calcium: 8.5 mg/dL — ABNORMAL LOW (ref 8.9–10.3)
Chloride: 102 mmol/L (ref 98–111)
Creatinine, Ser: 9.2 mg/dL — ABNORMAL HIGH (ref 0.44–1.00)
GFR calc Af Amer: 4 mL/min — ABNORMAL LOW (ref >60)
GFR calc non Af Amer: 4 mL/min — ABNORMAL LOW (ref >60)
Glucose, Bld: 74 mg/dL (ref 70–99)
Phosphorus: 7 mg/dL — ABNORMAL HIGH (ref 2.5–4.6)
Potassium: 4.1 mmol/L (ref 3.5–5.1)
Sodium: 135 mmol/L (ref 135–145)

## 2019-11-19 LAB — GASTROINTESTINAL PANEL BY PCR, STOOL (REPLACES STOOL CULTURE)

## 2019-11-19 LAB — GLUCOSE, CAPILLARY
Glucose-Capillary: 115 mg/dL — ABNORMAL HIGH (ref 70–99)
Glucose-Capillary: 82 mg/dL (ref 70–99)
Glucose-Capillary: 101 mg/dL — ABNORMAL HIGH (ref 70–99)
Glucose-Capillary: 201 mg/dL — ABNORMAL HIGH (ref 70–99)

## 2019-11-19 LAB — HEPATITIS B SURFACE ANTIGEN: Hepatitis B Surface Ag: NONREACTIVE

## 2019-11-19 LAB — TROPONIN I (HIGH SENSITIVITY): Troponin I (High Sensitivity): 111 ng/L (ref <18)

## 2019-11-19 LAB — CORTISOL: Cortisol, Plasma: 7 ug/dL

## 2019-11-19 LAB — HEPATITIS B SURFACE ANTIBODY,QUALITATIVE: Hep B S Ab: REACTIVE — AB

## 2019-11-19 LAB — MRSA PCR SCREENING: MRSA by PCR: NEGATIVE

## 2019-11-19 MED ORDER — MIDODRINE HCL 5 MG PO TABS
10.0000 mg | ORAL_TABLET | Freq: Three times a day (TID) | ORAL | Status: DC
  Administered 2019-11-19 – 2019-11-20 (×3): 10 mg via ORAL
  Filled 2019-11-19 (×3): qty 2

## 2019-11-19 MED ORDER — TRAMADOL HCL 50 MG PO TABS
50.0000 mg | ORAL_TABLET | Freq: Four times a day (QID) | ORAL | Status: DC | PRN
  Administered 2019-11-19 – 2019-11-20 (×2): 50 mg via ORAL
  Filled 2019-11-19 (×2): qty 1

## 2019-11-19 MED ORDER — METHYLPREDNISOLONE SODIUM SUCC 40 MG IJ SOLR
20.0000 mg | Freq: Two times a day (BID) | INTRAMUSCULAR | Status: DC
  Administered 2019-11-19 – 2019-11-20 (×2): 20 mg via INTRAVENOUS
  Filled 2019-11-19 (×2): qty 1

## 2019-11-19 MED ORDER — OXYCODONE HCL 5 MG PO TABS
5.0000 mg | ORAL_TABLET | Freq: Two times a day (BID) | ORAL | Status: DC | PRN
  Administered 2019-11-20: 5 mg via ORAL
  Filled 2019-11-19: qty 1

## 2019-11-19 MED ORDER — ORAL CARE MOUTH RINSE
15.0000 mL | Freq: Two times a day (BID) | OROMUCOSAL | Status: DC
  Administered 2019-11-19 (×2): 15 mL via OROMUCOSAL

## 2019-11-19 NOTE — Plan of Care (Signed)

## 2019-11-19 NOTE — Progress Notes (Signed)
CRITICAL VALUE ALERT  Critical Value: 111 Troponin  Date & Time Noted:  11/19/2019 1028  Provider Notified: Dr.Harding  Orders Received/Actions taken: none this time reported would evaluate and input orders as necessary.

## 2019-11-19 NOTE — Progress Notes (Signed)
Patient asking for something stronger for pain for abdomen(?Croan's symtoms diarrhea with abdominal cramping right side upper and lower abdomen),Dr.Critoru in see patient would order something for her.

## 2019-11-19 NOTE — Consult Note (Signed)
Referring Provider:  Dr Sallyanne Kuster Primary Care Physician:  [name], MD Primary Gastroenterologist:  Dr. Michail Sermon  Reason for Consultation: Crohn's disease  HPI: Natasha [name] is a 74 y.o. female with past medical history of Crohn's ileocolitis s/p partial right hemicolectomy with ileocolonic anastomosis in the ascending colon, history of chronic perianal fistula, history of end-stage renal disease on dialysis,history of COPD admitted to the hospital for further evaluation of worsening abdominal pain and diarrhea.  PET scan on November 01, 2019 showed left upper lobe mass.  Was seen by oncology and diagnosed with stage IV lung cancer.   During initial evaluation, she was found to have SVT.  Currently followed by cardiology.  GI is consulted for further management of her Crohn's disease.  CT abdomen pelvis with IV contrast yesterday showed anatomy of right hemicolectomy with mild sigmoid colon wall thickening without any inflammation or distention as well as no small bowel abnormalities.  Patient seen and examined at bedside in dialysis unit.  She had right-sided abdominal pain and diarrhea 1 week ago.  Her diarrhea has resolved now.  She continues to have dull right-sided abdominal pain.  Denies any further nausea or vomiting.  Denies any blood in the stool or black stool.  She is not able to get pain medication because of borderline low blood pressure.  Previous GI history Patient with longstanding history of Crohn's ileocolitis status post right hemicolectomy, history of chronic perianal fistula.  Was deemed not to be a candidate for surgical intervention for her fistula.  Currently on Humira every 2 weeks.  Last colonoscopy in October 2019 showed Crohn's ileocolitis, diverticulosis and one tubular adenoma.  Was last seen by Dr. Michail Sermon in May 2021.  Past Medical History:  Diagnosis Date  . Anal fistula   . Anemia in chronic kidney disease (CKD)   . Aortic insufficiency    Echo  3/18: mod conc LVH, EF 60-65, no RWMA, Gr 1 DD, mod AI, mild MR, normal RVSF, Trivial TR  . Arthritis   . Borderline hypertension   . Bulging disc    L3-L4  . Chronic diarrhea    due to crohn's  . CKD (chronic kidney disease), stage IV ([place])    MWF- [address]  . Crohn's disease ([place])    chronic ileitis  . Dyspnea    with exertion  . Emphysema/COPD ([place])   . GERD (gastroesophageal reflux disease)    denies  . History of blood transfusion   . History of glaucoma   . History of kidney stones   . History of pancreatitis    2008--  mercaptopurine  . History of small bowel obstruction    12-03-2010  due to crohn's ileitis  . Hypertension    no longer on medications  . Hypothyroidism, postsurgical    multinodule w/ hurthle cells  . Osteoporosis   . PAF (paroxysmal atrial fibrillation) ([place])   . Perianal Crohn's disease ([place])   . Peripheral vascular disease ([place])    blood clot behind knee left leg  . Polyarthralgia   . Seizures ([place])    03/2017  . Wears partial dentures    upper    Past Surgical History:  Procedure Laterality Date  . ABDOMINAL HYSTERECTOMY  1990   and  Appendectomy  . AV FISTULA PLACEMENT Left 12/08/2017   Procedure: INSERTION OF ARTERIOVENOUS (AV) GRAFT WITH ARTEGRAFT TO LEFT UPPER ARM;  Surgeon: Conrad Preston, MD;  Location: [place];  Service: Vascular;  Laterality: Left;  .  BASCILIC VEIN TRANSPOSITION Left 01/09/2017   Procedure: LEFT 1ST STAGE BRACHIAL VEIN TRANSPOSITION;  Surgeon: Conrad Catherine, MD;  Location: [place];  Service: Vascular;  Laterality: Left;  . BASCILIC VEIN TRANSPOSITION Left 06/16/2017   Procedure: Second Stage BASILIC VEIN TRANSPOSITION  LEFT ARM;  Surgeon: Conrad Garrison, MD;  Location: [place];  Service: Vascular;  Laterality: Left;  . BIOPSY  03/09/2018   Procedure: BIOPSY;  Surgeon: Wilford Corner, MD;  Location: [facility];  Service: Endoscopy;;  . CHOLECYSTECTOMY    . COLON RESECTION  x3 --  1978,  1987, 1989   ILEAL RESECTION  x2/   [place]  . COLONOSCOPY WITH PROPOFOL N/A 09/25/2014   Procedure: COLONOSCOPY WITH PROPOFOL;  Surgeon: Garlan Fair, MD;  [facility]: [facility];  Service: Endoscopy;  Laterality: N/A;  . COLONOSCOPY WITH PROPOFOL N/A 03/09/2018   Procedure: COLONOSCOPY WITH PROPOFOL;  Surgeon: Wilford Corner, MD;  [facility];  Service: Endoscopy;  Laterality: N/A;  . CYSTOSCOPY W/ URETERAL STENT PLACEMENT Right 11/19/2016   Procedure: CYSTOSCOPY WITH RIGHT RETROGRADE PYELOGRAM/ URETEROSCOPY WITH LASER AND RIGHT URETERAL STENT PLACEMENT;  Surgeon: Ardis Hughs, MD;  [facility]: [facility];  Service: Urology;  Laterality: Right;  . ESOPHAGOGASTRODUODENOSCOPY  03/04/2012   Procedure: ESOPHAGOGASTRODUODENOSCOPY (EGD);  Surgeon: Garlan Fair, MD;  Location: [facility];  Service: Endoscopy;  Laterality: N/A;  . EXAMINATION UNDER ANESTHESIA N/A 09/12/2014   Procedure: EXAM UNDER ANESTHESIA;  Surgeon: Rolm Bookbinder, MD;  Location: [place];  Service: General;  Laterality: N/A;  . EYE SURGERY     laser  . FLEXIBLE SIGMOIDOSCOPY  03/04/2012   Procedure: FLEXIBLE SIGMOIDOSCOPY;  Surgeon: Garlan Fair, MD;  [facility];  Service: Endoscopy;  Laterality: N/A;  . GLAUCOMA SURGERY Bilateral   . HOLMIUM LASER APPLICATION Right 0/27/2536   Procedure: HOLMIUM LASER APPLICATION;  Surgeon: Ardis Hughs, MD;  [facility];  Service: Urology;  Laterality: Right;  . INCISION AND DRAINAGE PERIRECTAL ABSCESS N/A 09/12/2014   Procedure: IRRIGATION AND DEBRIDEMENT PERIRECTAL ABSCESS;  Surgeon: Rolm Bookbinder, MD;  Location: [place];  Service: General;  Laterality: N/A;  . IR FLUORO GUIDE CV LINE RIGHT  03/20/2017  . IR US GUIDE VASC ACCESS RIGHT  03/20/2017  . NEGATIVE SLEEP STUDY  2008  . PLACEMENT OF SETON N/A 12/01/2014   Procedure: PLACEMENT OF SETON;  Surgeon: [name], MD;  Location: [facility];  Service: General;  Laterality: N/A;  .  POLYPECTOMY  03/09/2018   Procedure: POLYPECTOMY;  Surgeon: Wilford Corner, MD;  [facility];  Service: Endoscopy;;  . RECTAL EXAM UNDER ANESTHESIA N/A 12/01/2014   Procedure: RECTAL EXAM UNDER ANESTHESIA;  Surgeon: [name], MD;  Location: [facility];  Service: General;  Laterality: N/A;  . TOTAL THYROIDECTOMY  10-13-2003  . TRANSTHORACIC ECHOCARDIOGRAM  07-11-2013   mild LVH,  ef 55%,  moderate AR,  mild MR and TR,  trivial PR  . TUBAL LIGATION      Prior to Admission medications   Medication Sig Start Date End Date Taking? Authorizing Provider  acetaminophen (TYLENOL) 500 MG tablet Take 1,000 mg by mouth every 6 (six) hours as needed for mild pain.    Yes [provider]  Adalimumab (HUMIRA) 40 MG/0.8ML PSKT Inject 40 mg into the skin every 14 (fourteen) days.    Yes [provider]  albuterol (VENTOLIN HFA) 108 (90 Base) MCG/ACT inhaler INHALE 2 PUFFS BY MOUTH EVERY 6 HOURS  IF NEEDED FOR WHEEZING OR SHORTNESS OF BREATH 05/31/19  Yes Chesley Mires, MD  alendronate (FOSAMAX) 70 MG tablet Take 70 mg by mouth every Monday.  11/06/16  Yes [provider]  calcitRIOL (ROCALTROL) 0.25 MCG capsule Take 0.25 mcg by mouth every Monday, Wednesday, and Friday with hemodialysis. 02/03/17  Yes [provider]  calcium acetate (PHOSLO) 667 MG capsule Take 1 capsule (667 mg total) by mouth 3 (three) times daily with meals. 06/06/18  Yes Domenic Polite, MD  calcium-vitamin D (OSCAL WITH D) 500-200 MG-UNIT TABS tablet Take 1 tablet by mouth as needed (with dialysis).    Yes [provider]  cyanocobalamin (,VITAMIN B-12,) 1000 MCG/ML injection Inject 1,000 mcg into the muscle every 30 (thirty) days.    Yes [provider]  ELIQUIS 5 MG TABS tablet Take 2.5 mg by mouth 2 (two) times daily. Takes 1/2 tablet (2.80m) bid 08/23/18  Yes [provider]  fluticasone (FLONASE) 50 MCG/ACT nasal spray Place 1-2 sprays into both  nostrils daily. 03/19/19  Yes [provider]  Fluticasone-Umeclidin-Vilant (TRELEGY ELLIPTA) 100-62.5-25 MCG/INH AEPB Inhale 1 puff into the lungs daily. 07/25/19  Yes SChesley Mires MD  heparin 1000 UNIT/ML injection Inject 1,000 Units into the vein 3 (three) times a week. 12/01/18 11/30/19 Yes [provider]  ipratropium-albuterol (DUONEB) 0.5-2.5 (3) MG/3ML SOLN Take 3 mLs by nebulization every 6 (six) hours as needed. 12/30/18  Yes NFenton Foy NP  levothyroxine (SYNTHROID, LEVOTHROID) 125 MCG tablet Take 125 mcg by mouth daily before breakfast.    Yes [provider]  lidocaine-prilocaine (EMLA) cream Apply 1 application topically every Monday, Wednesday, and Friday.  11/29/18  Yes [provider]  loperamide (IMODIUM) 2 MG capsule Take 2 capsules (4 mg total) by mouth as needed for diarrhea or loose stools. 03/27/17  Yes Gherghe, CVella Redhead MD  Methoxy PEG-Epoetin Beta (MIRCERA IJ) Inject 150 mg as directed every 14 (fourteen) days. 06/13/19 06/11/20 Yes [provider]  metoprolol tartrate (LOPRESSOR) 25 MG tablet Take 1 tablet (25 mg total) by mouth 2 (two) times daily. 11/16/19 02/14/20 Yes Nahser, PWonda Cheng MD  midodrine (PROAMATINE) 5 MG tablet Take 5 mg by mouth every Monday, Wednesday, and Friday.  04/30/17  Yes [provider]  nitroGLYCERIN (NITROSTAT) 0.4 MG SL tablet Place 1 tablet (0.4 mg total) under the tongue every 5 (five) minutes as needed for chest pain. 05/03/19 11/18/19 Yes Nahser, PWonda Cheng MD  ondansetron (ZOFRAN ODT) 4 MG disintegrating tablet Take 1 tablet (4 mg total) by mouth every 8 (eight) hours as needed for nausea or vomiting. 03/29/18  Yes FDeno Etienne DO  OXYGEN Inhale 2-3 L into the lungs See admin instructions. Use every night and during the day as needed for shortness of breath   Yes [provider]  predniSONE (DELTASONE) 5 MG tablet Take 5 mg by mouth daily with breakfast.   Yes [provider]   Fluticasone-Umeclidin-Vilant (TRELEGY ELLIPTA) 100-62.5-25 MCG/INH AEPB Inhale 1 puff into the lungs daily. Patient not taking: Reported on 11/18/2019 11/17/19   WMartyn Ehrich NP  Respiratory Therapy Supplies (NEBULIZER) DEVI 1 Device by Does not apply route as needed. 12/07/18   NFenton Foy NP    Scheduled Meds: . apixaban  2.5 mg Oral BID  . [START ON 11/21/2019] calcitRIOL  0.25 mcg Oral Q M,W,F-HD  . calcium acetate  667 mg Oral TID WC  . Chlorhexidine Gluconate Cloth  6 each Topical Q0600  . diltiazem  30 mg Oral Q8H  . fluticasone  1-2 spray Each Nare Daily  . fluticasone furoate-vilanterol  1 puff Inhalation Daily  . levothyroxine  125 mcg Oral QAC breakfast  . [START ON 11/21/2019] lidocaine-prilocaine  1 application Topical Q M,W,F-HD  . mouth rinse  15 mL Mouth Rinse BID  . midodrine  10 mg Oral TID WC  . pantoprazole (PROTONIX) IV  40 mg Intravenous Q12H  . predniSONE  5 mg Oral Q breakfast  . umeclidinium bromide  1 puff Inhalation Daily   Continuous Infusions: . dextrose 50 mL/hr at 11/19/19 1212   PRN Meds:.acetaminophen **OR** acetaminophen, alum & mag hydroxide-simeth, loperamide, ondansetron **OR** ondansetron (ZOFRAN) IV, oxyCODONE, traMADol  Allergies as of 11/18/2019 - Review Complete 11/18/2019  Allergen Reaction Noted  . Mercaptopurine Other (See Comments) 03/04/2012  . Remicade [infliximab] Other (See Comments) 03/04/2012    Family History  Problem Relation Age of Onset  . Hypertension Father   . Heart disease Father   . Heart attack Father   . Diverticulitis Mother   . COPD Mother   . Hypertension Mother   . Heart disease Mother   . Heart attack Mother   . Heart attack Brother   . Colon cancer Brother   . Diabetes Brother   . Thyroid disease Brother     Social History   Socioeconomic History  . Marital status: Married    Spouse name: [name]  . Number of children: 3  . Years of education: 98  . Highest education level: Not on file   Occupational History  . Occupation: Retired    Fish farm manager: RETIRED  Tobacco Use  . Smoking status: Former Smoker    Packs/day: 1.00    Years: 35.00    Pack years: 35.00    Types: Cigarettes    Start date: 02/22/1967    Quit date: 11/27/2017    Years since quitting: 1.9  . Smokeless tobacco: Never Used  Vaping Use  . Vaping Use: Never used  Substance and Sexual Activity  . Alcohol use: No  . Drug use: No  . Sexual activity: Never  Other Topics Concern  . Not on file  Social History Narrative   Patient is married [name]) and lives at home with her husband.   Patient has three children.   Patient is retired on disability.   Patient drinks two cups of caffeine daily.   Patient is right-handed.         Social Determinants of Health   Financial Resource Strain:   . Difficulty of Paying Living Expenses:   Food Insecurity:   . Worried About Charity fundraiser in the Last Year:   . Arboriculturist in the Last Year:   Transportation Needs:   . Film/video editor (Medical):   [name] Lack of Transportation (Non-Medical):   Physical Activity:   . Days of Exercise per Week:   . Minutes of Exercise per Session:   Stress:   . Feeling of Stress :   Social Connections:   . Frequency of Communication with Friends and Family:   . Frequency of Social Gatherings with Friends and Family:   . Attends Religious Services:   . Active Member of Clubs or Organizations:   . Attends Archivist Meetings:   [name] Marital Status:   Intimate Partner Violence:   . Fear of Current or Ex-Partner:   . Emotionally Abused:   [name] Physically Abused:   . Sexually Abused:  Review of Systems: All negative except as stated above in HPI.  Physical Exam: Vital signs: Vitals:   11/19/19 0844 11/19/19 0854  BP:  (!) 104/52  Pulse:  80  Resp:  20  Temp:  98 F (36.7 C)  SpO2: 100% 96%   Last BM Date: 11/18/19 Physical Exam Vitals reviewed.  Constitutional:      General: She is not in  acute distress. HENT:     Head: Normocephalic and atraumatic.  Eyes:     General: No scleral icterus.    Extraocular Movements: Extraocular movements intact.  Cardiovascular:     Rate and Rhythm: Normal rate and regular rhythm.     Heart sounds: Murmur heard.   Pulmonary:     Effort: Pulmonary effort is normal. No respiratory distress.  Abdominal:     General: Bowel sounds are normal. There is no distension.     Palpations: Abdomen is soft.     Tenderness: There is no abdominal tenderness. There is no guarding.  Musculoskeletal:        General: No swelling.     Cervical back: Normal range of motion and neck supple.  Skin:    General: Skin is warm.     Coloration: Skin is not jaundiced.  Neurological:     Mental Status: She is alert and oriented to person, place, and time.  Psychiatric:        Mood and Affect: Mood normal.        Thought Content: Thought content normal.     GI:  Lab Results: Recent Labs    11/18/19 1119 11/19/19 0652  WBC 4.2 5.6  HGB 10.6* 10.4*  HCT 36.5 35.8*  PLT 102* 111*   BMET Recent Labs    11/18/19 1119 11/19/19 0708  NA 138 135  K 3.2* 4.1  CL 105 102  CO2 20* 20*  GLUCOSE 194* 74  BUN 36* 41*  CREATININE 7.98* 9.20*  CALCIUM 8.3* 8.5*   LFT Recent Labs    11/18/19 1119 11/18/19 1119 11/19/19 0708  PROT 5.6*  --   --   ALBUMIN 2.6*   < > 2.5*  AST 18  --   --   ALT 11  --   --   ALKPHOS 60  --   --   BILITOT 0.2*  --   --    < > = values in this interval not displayed.   PT/INR No results for input(s): LABPROT, INR in the last 72 hours.   Studies/Results: CT Angio Chest PE W and/or Wo Contrast  Result Date: 11/18/2019 CLINICAL DATA:  Shortness of breath with nausea, dizziness and abdominal pain. Atrial fibrillation with rapid ventricular response. Clinical concern for acute pulmonary embolism. Chronic kidney disease on hemodialysis. EXAM: CT ANGIOGRAPHY CHEST CT ABDOMEN AND PELVIS WITH CONTRAST TECHNIQUE:  Multidetector CT imaging of the chest was performed using the standard protocol during bolus administration of intravenous contrast. Multiplanar CT image reconstructions and MIPs were obtained to evaluate the vascular anatomy. Multidetector CT imaging of the abdomen and pelvis was performed using the standard protocol during bolus administration of intravenous contrast. CONTRAST:  166m OMNIPAQUE IOHEXOL 350 MG/ML SOLN COMPARISON:  PET-CT 11/01/2019. Chest CT 10/18/2019 and abdominopelvic CT 09/20/2019. FINDINGS: CTA CHEST FINDINGS Cardiovascular: The pulmonary arteries are well opacified with contrast to the level of the subsegmental branches. There is no evidence of acute pulmonary embolism. Stable mild central enlargement of the pulmonary arteries and mild atherosclerosis of the aorta, great vessels and  coronary arteries. Left axillary and brachial stent noted. The heart size is normal. There is no pericardial effusion. Mediastinum/Nodes: Mildly prominent left hilar lymph nodes are grossly stable. There are no enlarged mediastinal or hilar lymph nodes. Previous thyroidectomy. The trachea and esophagus demonstrate no significant findings. Lungs/Pleura: No pleural effusion or pneumothorax. Mild dependent atelectasis at both lung bases. Moderate centrilobular and paraseptal emphysema. Known hypermetabolic left lung nodules are again noted with slight enlargement of the dominant perihilar lingular component measuring up to 2.2 cm on image 41/7 (previously 2.0 cm). Additional nodules are present the left lung, including a 9 mm left upper lobe nodule on image 24/7 and a spiculated left upper lobe nodule measuring 10 mm on image 26/7. No suspicious right lung nodules. Musculoskeletal/Chest wall: Inferior endplate lucency and surrounding sclerosis again noted within the T10 vertebral body. Although hypermetabolic on prior PET-CT, the appearance is most typical of a Schmorl's node. No acute osseous findings. CT ABDOMEN  AND PELVIS FINDINGS Hepatobiliary: The liver is normal in density without suspicious focal abnormality. Probable focal fat adjacent to the falciform ligament. Stable chronic mild intrahepatic and extrahepatic biliary dilatation post cholecystectomy. 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 No evidence of pancreatic mass or surrounding inflammation. Spleen: Normal in size without focal abnormality. Adrenals/Urinary Tract: Both adrenal glands appear normal. Numerous bilateral renal calculi and low-density renal lesions are again noted, similar to previous study. There is bilateral renal cortical thinning and absent contrast excretion consistent with chronic renal failure. No hydronephrosis. No significant bladder findings. 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 No small bowel abnormalities are identified status post right hemicolectomy and anastomosis. There are diverticular changes in the descending and sigmoid colon with possible mild sigmoid colon wall thickening, but no surrounding inflammation or significant distension. Vascular/Lymphatic: There are no enlarged abdominal or pelvic lymph nodes. Moderate aortic and branch vessel atherosclerosis without acute vascular findings. There is mild reflux of contrast into the IVC and hepatic veins. The portal, superior mesenteric and splenic veins are patent. Reproductive: Hysterectomy. No adnexal mass. Stable curvilinear calcification between the vagina and rectum. Other: Intact abdominal wall. No ascites or peritoneal nodularity. Stable calcification in the left lower quadrant anterior to the sigmoid colon. Musculoskeletal: No acute or significant osseous findings. Review of the MIP images confirms the above findings. IMPRESSION: 1. No evidence of acute pulmonary embolism or other acute chest process. 2. Known hypermetabolic left lung nodules consistent with  metastatic disease. The dominant perihilar lingular component has mildly enlarged. 3. No acute findings in the abdomen or pelvis. 4. Bilateral nephrolithiasis and renal cortical thinning consistent with chronic renal failure. 5. Aortic Atherosclerosis (ICD10-I70.0) and Emphysema (ICD10-J43.9). Electronically Signed   By: Richardean Sale M.D.   On: 11/18/2019 13:16   CT ABDOMEN PELVIS W CONTRAST  Result Date: 11/18/2019 CLINICAL DATA:  Shortness of breath with nausea, dizziness and abdominal pain. Atrial fibrillation with rapid ventricular response. Clinical concern for acute pulmonary embolism. Chronic kidney disease on hemodialysis. EXAM: CT ANGIOGRAPHY CHEST CT ABDOMEN AND PELVIS WITH CONTRAST TECHNIQUE: Multidetector CT imaging of the chest was performed using the standard protocol during bolus administration of intravenous contrast. Multiplanar CT image reconstructions and MIPs were obtained to evaluate the vascular anatomy. Multidetector CT imaging of the abdomen and pelvis was performed using the standard protocol during bolus administration of intravenous contrast. CONTRAST:  149m OMNIPAQUE IOHEXOL 350 MG/ML SOLN COMPARISON:  PET-CT 11/01/2019. Chest CT 10/18/2019 and abdominopelvic CT 09/20/2019.  FINDINGS: CTA CHEST FINDINGS Cardiovascular: The pulmonary arteries are well opacified with contrast to the level of the subsegmental branches. There is no evidence of acute pulmonary embolism. Stable mild central enlargement of the pulmonary arteries and mild atherosclerosis of the aorta, great vessels and coronary arteries. Left axillary and brachial stent noted. The heart size is normal. There is no pericardial effusion. Mediastinum/Nodes: Mildly prominent left hilar lymph nodes are grossly stable. There are no enlarged mediastinal or hilar lymph nodes. Previous thyroidectomy. The trachea and esophagus demonstrate no significant findings. Lungs/Pleura: No pleural effusion or pneumothorax. Mild dependent  atelectasis at both lung bases. Moderate centrilobular and paraseptal emphysema. Known hypermetabolic left lung nodules are again noted with slight enlargement of the dominant perihilar lingular component measuring up to 2.2 cm on image 41/7 (previously 2.0 cm). Additional nodules are present the left lung, including a 9 mm left upper lobe nodule on image 24/7 and a spiculated left upper lobe nodule measuring 10 mm on image 26/7. No suspicious right lung nodules. Musculoskeletal/Chest wall: Inferior endplate lucency and surrounding sclerosis again noted within the T10 vertebral body. Although hypermetabolic on prior PET-CT, the appearance is most typical of a Schmorl's node. No acute osseous findings. CT ABDOMEN AND PELVIS FINDINGS Hepatobiliary: The liver is normal in density without suspicious focal abnormality. Probable focal fat adjacent to the falciform ligament. Stable chronic mild intrahepatic and extrahepatic biliary dilatation post cholecystectomy. 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 No evidence of pancreatic mass or surrounding inflammation. Spleen: Normal in size without focal abnormality. Adrenals/Urinary Tract: Both adrenal glands appear normal. Numerous bilateral renal calculi and low-density renal lesions are again noted, similar to previous study. There is bilateral renal cortical thinning and absent contrast excretion consistent with chronic renal failure. No hydronephrosis. No significant bladder findings. 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 No small bowel abnormalities are identified status post right hemicolectomy and anastomosis. There are diverticular changes in the descending and sigmoid colon with possible mild sigmoid colon wall thickening, but no surrounding inflammation or significant distension. Vascular/Lymphatic: There are no enlarged abdominal or pelvic lymph nodes.  Moderate aortic and branch vessel atherosclerosis without acute vascular findings. There is mild reflux of contrast into the IVC and hepatic veins. The portal, superior mesenteric and splenic veins are patent. Reproductive: Hysterectomy. No adnexal mass. Stable curvilinear calcification between the vagina and rectum. Other: Intact abdominal wall. No ascites or peritoneal nodularity. Stable calcification in the left lower quadrant anterior to the sigmoid colon. Musculoskeletal: No acute or significant osseous findings. Review of the MIP images confirms the above findings. IMPRESSION: 1. No evidence of acute pulmonary embolism or other acute chest process. 2. Known hypermetabolic left lung nodules consistent with metastatic disease. The dominant perihilar lingular component has mildly enlarged. 3. No acute findings in the abdomen or pelvis. 4. Bilateral nephrolithiasis and renal cortical thinning consistent with chronic renal failure. 5. Aortic Atherosclerosis (ICD10-I70.0) and Emphysema (ICD10-J43.9). Electronically Signed   By: Richardean Sale M.D.   On: 11/18/2019 13:16   DG Chest Port 1 View  Result Date: 11/18/2019 CLINICAL DATA:  Former smoker with chest pain. History of emphysema. Hypertension. EXAM: PORTABLE CHEST 1 VIEW COMPARISON:  CT chest 10/18/2019 FINDINGS: Normal heart size. Pulmonary vascular congestion. No pleural effusion or overt edema. No acute airspace consolidation. Left mid lung nodule corresponding to FDG avid 2.3 cm left upper lobe lung nodule seen on previous CT of the chest is unchanged and remains  worrisome for primary bronchogenic carcinoma. The lungs appear hyperinflated and there are diffuse interstitial changes compatible with emphysema. IMPRESSION: 1. Pulmonary vascular congestion without overt edema or pleural effusion. 2. Left upper lobe lung nodule is unchanged and remains worrisome for primary bronchogenic carcinoma. 3.  Emphysema (ICD10-J43.9). Electronically Signed   By:  Kerby Moors M.D.   On: 11/18/2019 11:17    Impression/Plan: -Right-sided abdominal pain and diarrhea.  History of Crohn's ileocolitis status post right hemicolectomy.  Diarrhea resolved now.  GI pathogen panel negative.  CT scan showed mild sigmoid colon wall thickening without any inflammation or distention as well as no small bowel abnormalities.  -Chronic perianal fistula.  -Stage IV lung cancer -End-stage renal disease on dialysis  Recommendations ------------------------ -Patient's diarrhea has resolved now.  CT scan negative for any significant inflammatory changes in the right side of the abdomen.  Presentation not consistent with flare of Crohn's disease. -If diarrhea reoccurs, recommend checking for C. Difficile. -Start low-dose IV Solu-Medrol at 20 mg to see if there is any improvement in her symptoms -GI will follow     LOS: 0 days   Otis Brace  MD, FACP 11/19/2019, 1:54 PM  Contact #  [phone]

## 2019-11-19 NOTE — Care Management Important Message (Signed)
Important Message  Patient Details  Name: DAYSIA VANDENBOOM MRN: [id number] Date of Birth: 04-21-1946   Medicare Important Message Given:   Yes     Norina Buzzard, RN 11/19/2019, 9:31 AM

## 2019-11-19 NOTE — Progress Notes (Signed)
PROGRESS NOTE    Natasha [name]  TKZ:[id number] DOB: July 01, 1945 DOA: 11/18/2019 PCP: [name], MD  Outpatient Specialists:     Brief Narrative:  As per H&P done by Dr. Glennon Hamilton "Natasha [name] is a 74 y.o. 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  This morning she reports feeling awoken out of her sleep several times with episodes of diarrhea.  Then subsequently was woken out of her sleep with severe epigastric pain that moved down into her right lower quadrant and radiated to her back.  Associated symptoms included nausea, nonbloody emesis, dizziness, and shortness of breath.  She tried taking antibiotic without relief.  Her husband had to turn her oxygen from 3 L to 4 without any improvement in symptoms.  EMS was called due to the severity of her symptoms.  EMS noted that the patient appeared to be in atrial fibrillation with heart rates into the 140s and she was given a bolus of diltiazem.    Patient admits that on 5/25 she had gone to the hospital for CT scan of her chest and was noted to be in a regular heart rhythm.  Records show that patient was in SVT and ultimately required adenosine 6 mg to go back into sinus rhythm.  ED Course: Patient was noted to be afebrile with blood pressures initially reported into the 70s and back in sinus rhythm.  Blood pressures improved with 500 mL bolus.  Initial blood glucose was 58 for which patient was given an amp of D50 prior to initial lab work.  Labs significant for hemoglobin 10.6, platelets 102, potassium 3.2, BUN 36, creatinine 7.98, glucose 194, lipase 56, albumin 2.6, troponin 46, and lactic acid 1.6.  CT scan of the chest, abdomen, and pelvis showed hypermetabolic left lung nodules consistent with metastatic  disease, but no other cause for patient's symptoms.  Blood sugars were noted to drop back down to 55 and patient was given an amp of D50.  TRH called to admit".  11/19/2019: Patient seen alongside patient's husband.  Patient continues to report intermittent right-sided abdominal pain.  According to the patient, the pain is both sharp and dull.  CT scan of the abdomen with contrast did not reveal any evidence of Crohn's exacerbation.  Patient is on chronic low-dose prednisone.  Cardiology and nephrology input is appreciated.  Troponin is likely associated with the SVTs.  Discussed with cardiology team, previously ordered echocardiogram will be discontinued.  No reported chest pain or shortness of breath.  Patient is tolerating the breakfast.  Low blood sugar was documented on presentation.  Patient was also hypotensive.  Based on likely metastatic lung cancer, will check cortisol.  Hopefully, this not secondary to adrenal insufficiency (possibly secondary to meds).  We will still proceed with calorie count, and will continue to monitor blood sugar closely.  Hopefully, patient will be discharged in the next 24 to 48 hours, if patient continues to do well.  Plans for hemodialysis today.  Worthy to mention that patient is a poor historian.  Assessment & Plan:   Principal Problem:   Epigastric abdominal pain Active Problems:   Crohn's disease without complication (HCC)   Nausea vomiting and diarrhea   Anemia of chronic disease   ESRD on dialysis ([place])   Hypothyroidism   Emphysema/COPD (HCC)   Chronic respiratory  failure with hypoxia (HCC)   Metastatic lung carcinoma, left (HCC)   Arrhythmia   Elevated troponin  Right sided abdominal pain: -Patient is not particularly good historian. -Patient endorses associated nausea, vomiting, diarrhea -GI symptoms are resolved, but patient has continued to complain of intermittent abdominal pain. -CT abdomen is nonrevealing. -Cautious use of  analgesics. -Follow cortisol level.  Elevated troponin/SVT: -Cardiology team is managing -Troponin is felt to be secondary to tachycardia.  Hypoglycemia:  -In the setting of likely metastatic lung cancer, transient hypotension and abdominal pain, will check cortisol level.  Hopefully, there is no mets to the adrenal gland.   -CT abdomen is nonrevealing, but not dedicated to the adrenals.   -Continue to monitor calorie intake and blood sugar.    Transient hypotension:  -See above.   -Hypotension has resolved.   -IV fluids as seen above  ESRD on HD:  -Nephrology input is appreciated.   -For hemodialysis today.    Hypokalemia:  -Resolved.   -Repeat potassium level is 4.1.    Stage IV lung cancer: Patient being followed in the outpatient setting by oncology for concerns for stage IV cancer with left upper lobe nodule, left hilar/suprahilar nodal metastases, and metastatic osseous lesions to T10/T11.  PET scan from 6/8 revealed 2.3 x 1.9 cm central left upper lobe masswith SUV max of 16.2compatible with primary bronchogenic neoplasm.  Patient was thought to be at high risk for complications from bronchoscopy. . -Continue outpatient follow-up with oncology and pulmonology  Crohn's disease: Patient on chronic prednisone and Humira. -Continue prednisone. -CT abdomen with contrast is not suggestive of Crohn's exacerbation  COPD/emphysema Chronic respiratory failure:  -Stable.   -Continue Trelegy  -DuoNebs as needed for shortness of breath/wheezing  Macrocytic anemia:  -Vitamin B12 and folate checked previously, and B12 within normal limits.  -Hopefully, there is no component of myelodysplastic syndrome.  Patient is already under the care of the heme-onc team.   -Hemoglobin is at goal for hemodialysis/ESRD on hemodialysis.    Hypothyroidism -TSH was 1.49 -Continue levothyroxine   Thrombocytopenia: Chronic.   -Continue to monitor  History of COVID-19 virus  infection  DVT prophylaxis: Eliquis Code Status: Full code Family Communication: Husband Disposition Plan: Home eventually   Consultants:   Cardiology  Nephrology  Procedures:   None  Antimicrobials:   None   Subjective: Right-sided abdominal pain, intermittent.  Objective: Vitals:   11/19/19 0553 11/19/19 0842 11/19/19 0844 11/19/19 0854  BP: (!) 89/56   (!) 104/52  Pulse: 69   80  Resp:    20  Temp:    98 F (36.7 C)  TempSrc:    Oral  SpO2:  100% 100% 96%  Weight:      Height:        Intake/Output Summary (Last 24 hours) at 11/19/2019 1206 Last data filed at 11/19/2019 0900 Gross per 24 hour  Intake 1323.85 ml  Output 200 ml  Net 1123.85 ml   Filed Weights   11/18/19 1103 11/19/19 0500  Weight: 50.7 kg 51.6 kg    Examination:  General exam: Appears calm and comfortable  Respiratory system: Clear to auscultation. Respiratory effort normal. Cardiovascular system: S1 & S2 heard. No pedal edema. Gastrointestinal system: Abdomen is nondistended, soft and vague right-sided abdominal tenderness. No organomegaly or masses felt. Normal bowel sounds heard. Central nervous system: Alert and oriented. No focal neurological deficits. Extremities: No leg edema.    Data Reviewed: I have personally reviewed following labs and imaging studies  CBC:  Recent Labs  Lab 11/15/19 1317 11/18/19 1119 11/19/19 0652  WBC 5.1 4.2 5.6  NEUTROABS 1.9  --   --   HGB 11.6* 10.6* 10.4*  HCT 38.3 36.5 35.8*  MCV 105.5* 108.6* 106.5*  PLT 112* 102* 948*   Basic Metabolic Panel: Recent Labs  Lab 11/15/19 1317 11/18/19 1119 11/19/19 0708  NA 140 138 135  K 3.0* 3.2* 4.1  CL 103 105 102  CO2 24 20* 20*  GLUCOSE 75 194* 74  BUN 23 36* 41*  CREATININE 6.57* 7.98* 9.20*  CALCIUM 8.7* 8.3* 8.5*  PHOS  --   --  7.0*   GFR: Estimated Creatinine Clearance: 4.4 mL/min (A) (by C-G formula based on SCr of 9.2 mg/dL (H)). Liver Function Tests: Recent Labs  Lab  11/15/19 1317 11/18/19 1119 11/19/19 0708  AST 16 18  --   ALT 10 11  --   ALKPHOS 82 60  --   BILITOT 0.6 0.2*  --   PROT 6.8 5.6*  --   ALBUMIN 3.1* 2.6* 2.5*   Recent Labs  Lab 11/18/19 1119  LIPASE 56*   No results for input(s): AMMONIA in the last 168 hours. Coagulation Profile: No results for input(s): INR, PROTIME in the last 168 hours. Cardiac Enzymes: No results for input(s): CKTOTAL, CKMB, CKMBINDEX, TROPONINI in the last 168 hours. BNP (last 3 results) No results for input(s): PROBNP in the last 8760 hours. HbA1C: Recent Labs    11/18/19 1703  HGBA1C 5.0   CBG: Recent Labs  Lab 11/18/19 1642 11/18/19 2131 11/19/19 0056 11/19/19 0310 11/19/19 1158  GLUCAP 107* 87 115* 82 101*   Lipid Profile: No results for input(s): CHOL, HDL, LDLCALC, TRIG, CHOLHDL, LDLDIRECT in the last 72 hours. Thyroid Function Tests: Recent Labs    11/18/19 1703  TSH 1.499   Anemia Panel: No results for input(s): VITAMINB12, FOLATE, FERRITIN, TIBC, IRON, RETICCTPCT in the last 72 hours. Urine analysis:    Component Value Date/Time   COLORURINE YELLOW 11/18/2019 2138   APPEARANCEUR CLEAR 11/18/2019 2138   LABSPEC 1.019 11/18/2019 2138   PHURINE 6.0 11/18/2019 2138   GLUCOSEU 50 (A) 11/18/2019 2138   HGBUR MODERATE (A) 11/18/2019 2138   BILIRUBINUR NEGATIVE 11/18/2019 2138   [place] NEGATIVE 11/18/2019 2138   PROTEINUR 30 (A) 11/18/2019 2138   UROBILINOGEN 0.2 09/09/2014 0850   NITRITE NEGATIVE 11/18/2019 2138   LEUKOCYTESUR NEGATIVE 11/18/2019 2138   Sepsis Labs: @LABRCNTIP (procalcitonin:4,lacticidven:4)  ) Recent Results (from the past 240 hour(s))  SARS Coronavirus 2 by RT PCR (hospital order, performed in [facility] hospital lab) Nasopharyngeal Nasopharyngeal Swab     Status: None   Collection Time: 11/18/19  2:31 PM   Specimen: Nasopharyngeal Swab  Result Value Ref Range Status   SARS Coronavirus 2 NEGATIVE NEGATIVE Final    Comment: (NOTE) SARS-CoV-2  target nucleic acids are NOT DETECTED.  The SARS-CoV-2 RNA is generally detectable in upper and lower respiratory specimens during the acute phase of infection. The lowest concentration of SARS-CoV-2 viral copies this assay can detect is 250 copies / mL. A negative result does not preclude SARS-CoV-2 infection and should not be used as the sole basis for treatment or other patient management decisions.  A negative result may occur with improper specimen collection / handling, submission of specimen other than nasopharyngeal swab, presence of viral mutation(s) within the areas targeted by this assay, and inadequate number of viral copies (<250 copies / mL). A negative result must be combined with clinical  observations, patient history, and epidemiological information.  Fact Sheet for Patients:   StrictlyIdeas.no  Fact Sheet for Healthcare Providers: BankingDealers.co.za  This test is not yet approved or  cleared by the [place] FDA and has been authorized for detection and/or diagnosis of SARS-CoV-2 by FDA under an Emergency Use Authorization (EUA).  This EUA will remain in effect (meaning this test can be used) for the duration of the COVID-19 declaration under Section 564(b)(1) of the Act, 21 U.S.C. section 360bbb-3(b)(1), unless the authorization is terminated or revoked sooner.  Performed at [facility], [place] [address], [place], [place] [address]   MRSA PCR Screening     Status: None   Collection Time: 11/19/19  6:16 AM   Specimen: Nasopharyngeal  Result Value Ref Range Status   MRSA by PCR NEGATIVE NEGATIVE Final    Comment:        The GeneXpert MRSA Assay (FDA approved for NASAL specimens only), is one component of a comprehensive MRSA colonization surveillance program. It is not intended to diagnose MRSA infection nor to guide or monitor treatment for MRSA infections. Performed at [facility],  [place] [address] [place],  [address]          Radiology Studies: CT Angio Chest PE W and/or Wo Contrast  Result Date: 11/18/2019 CLINICAL DATA:  Shortness of breath with nausea, dizziness and abdominal pain. Atrial fibrillation with rapid ventricular response. Clinical concern for acute pulmonary embolism. Chronic kidney disease on hemodialysis. EXAM: CT ANGIOGRAPHY CHEST CT ABDOMEN AND PELVIS WITH CONTRAST TECHNIQUE: Multidetector CT imaging of the chest was performed using the standard protocol during bolus administration of intravenous contrast. Multiplanar CT image reconstructions and MIPs were obtained to evaluate the vascular anatomy. Multidetector CT imaging of the abdomen and pelvis was performed using the standard protocol during bolus administration of intravenous contrast. CONTRAST:  140m OMNIPAQUE IOHEXOL 350 MG/ML SOLN COMPARISON:  PET-CT 11/01/2019. Chest CT 10/18/2019 and abdominopelvic CT 09/20/2019. FINDINGS: CTA CHEST FINDINGS Cardiovascular: The pulmonary arteries are well opacified with contrast to the level of the subsegmental branches. There is no evidence of acute pulmonary embolism. Stable mild central enlargement of the pulmonary arteries and mild atherosclerosis of the aorta, great vessels and coronary arteries. Left axillary and brachial stent noted. The heart size is normal. There is no pericardial effusion. Mediastinum/Nodes: Mildly prominent left hilar lymph nodes are grossly stable. There are no enlarged mediastinal or hilar lymph nodes. Previous thyroidectomy. The trachea and esophagus demonstrate no significant findings. Lungs/Pleura: No pleural effusion or pneumothorax. Mild dependent atelectasis at both lung bases. Moderate centrilobular and paraseptal emphysema. Known hypermetabolic left lung nodules are again noted with slight enlargement of the dominant perihilar lingular component measuring up to 2.2 cm on image 41/7 (previously 2.0 cm). Additional nodules are  present the left lung, including a 9 mm left upper lobe nodule on image 24/7 and a spiculated left upper lobe nodule measuring 10 mm on image 26/7. No suspicious right lung nodules. Musculoskeletal/Chest wall: Inferior endplate lucency and surrounding sclerosis again noted within the T10 vertebral body. Although hypermetabolic on prior PET-CT, the appearance is most typical of a Schmorl's node. No acute osseous findings. CT ABDOMEN AND PELVIS FINDINGS Hepatobiliary: The liver is normal in density without suspicious focal abnormality. Probable focal fat adjacent to the falciform ligament. Stable chronic mild intrahepatic and extrahepatic biliary dilatation post cholecystectomy. 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 No evidence of pancreatic mass or surrounding inflammation. Spleen:  Normal in size without focal abnormality. Adrenals/Urinary Tract: Both adrenal glands appear normal. Numerous bilateral renal calculi and low-density renal lesions are again noted, similar to previous study. There is bilateral renal cortical thinning and absent contrast excretion consistent with chronic renal failure. No hydronephrosis. No significant bladder findings. 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 No small bowel abnormalities are identified status post right hemicolectomy and anastomosis. There are diverticular changes in the descending and sigmoid colon with possible mild sigmoid colon wall thickening, but no surrounding inflammation or significant distension. Vascular/Lymphatic: There are no enlarged abdominal or pelvic lymph nodes. Moderate aortic and branch vessel atherosclerosis without acute vascular findings. There is mild reflux of contrast into the IVC and hepatic veins. The portal, superior mesenteric and splenic veins are patent. Reproductive: Hysterectomy. No adnexal mass. Stable curvilinear calcification  between the vagina and rectum. Other: Intact abdominal wall. No ascites or peritoneal nodularity. Stable calcification in the left lower quadrant anterior to the sigmoid colon. Musculoskeletal: No acute or significant osseous findings. Review of the MIP images confirms the above findings. IMPRESSION: 1. No evidence of acute pulmonary embolism or other acute chest process. 2. Known hypermetabolic left lung nodules consistent with metastatic disease. The dominant perihilar lingular component has mildly enlarged. 3. No acute findings in the abdomen or pelvis. 4. Bilateral nephrolithiasis and renal cortical thinning consistent with chronic renal failure. 5. Aortic Atherosclerosis (ICD10-I70.0) and Emphysema (ICD10-J43.9). Electronically Signed   By: Richardean Sale M.D.   On: 11/18/2019 13:16   CT ABDOMEN PELVIS W CONTRAST  Result Date: 11/18/2019 CLINICAL DATA:  Shortness of breath with nausea, dizziness and abdominal pain. Atrial fibrillation with rapid ventricular response. Clinical concern for acute pulmonary embolism. Chronic kidney disease on hemodialysis. EXAM: CT ANGIOGRAPHY CHEST CT ABDOMEN AND PELVIS WITH CONTRAST TECHNIQUE: Multidetector CT imaging of the chest was performed using the standard protocol during bolus administration of intravenous contrast. Multiplanar CT image reconstructions and MIPs were obtained to evaluate the vascular anatomy. Multidetector CT imaging of the abdomen and pelvis was performed using the standard protocol during bolus administration of intravenous contrast. CONTRAST:  173m OMNIPAQUE IOHEXOL 350 MG/ML SOLN COMPARISON:  PET-CT 11/01/2019. Chest CT 10/18/2019 and abdominopelvic CT 09/20/2019. FINDINGS: CTA CHEST FINDINGS Cardiovascular: The pulmonary arteries are well opacified with contrast to the level of the subsegmental branches. There is no evidence of acute pulmonary embolism. Stable mild central enlargement of the pulmonary arteries and mild atherosclerosis of the  aorta, great vessels and coronary arteries. Left axillary and brachial stent noted. The heart size is normal. There is no pericardial effusion. Mediastinum/Nodes: Mildly prominent left hilar lymph nodes are grossly stable. There are no enlarged mediastinal or hilar lymph nodes. Previous thyroidectomy. The trachea and esophagus demonstrate no significant findings. Lungs/Pleura: No pleural effusion or pneumothorax. Mild dependent atelectasis at both lung bases. Moderate centrilobular and paraseptal emphysema. Known hypermetabolic left lung nodules are again noted with slight enlargement of the dominant perihilar lingular component measuring up to 2.2 cm on image 41/7 (previously 2.0 cm). Additional nodules are present the left lung, including a 9 mm left upper lobe nodule on image 24/7 and a spiculated left upper lobe nodule measuring 10 mm on image 26/7. No suspicious right lung nodules. Musculoskeletal/Chest wall: Inferior endplate lucency and surrounding sclerosis again noted within the T10 vertebral body. Although hypermetabolic on prior PET-CT, the appearance is most typical of a Schmorl's node. No acute osseous findings. CT ABDOMEN AND PELVIS FINDINGS Hepatobiliary: The liver is  normal in density without suspicious focal abnormality. Probable focal fat adjacent to the falciform ligament. Stable chronic mild intrahepatic and extrahepatic biliary dilatation post cholecystectomy. 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 No evidence of pancreatic mass or surrounding inflammation. Spleen: Normal in size without focal abnormality. Adrenals/Urinary Tract: Both adrenal glands appear normal. Numerous bilateral renal calculi and low-density renal lesions are again noted, similar to previous study. There is bilateral renal cortical thinning and absent contrast excretion consistent with chronic renal failure. No hydronephrosis. No significant bladder findings. Stomach/Bowel:  Gastric fundal diverticulum again noted with incomplete distention of the stomach. No small bowel abnormalities are identified status post right hemicolectomy and anastomosis. There are diverticular changes in the descending and sigmoid colon with possible mild sigmoid colon wall thickening, but no surrounding inflammation or significant distension. Vascular/Lymphatic: There are no enlarged abdominal or pelvic lymph nodes. Moderate aortic and branch vessel atherosclerosis without acute vascular findings. There is mild reflux of contrast into the IVC and hepatic veins. The portal, superior mesenteric and splenic veins are patent. Reproductive: Hysterectomy. No adnexal mass. Stable curvilinear calcification between the vagina and rectum. Other: Intact abdominal wall. No ascites or peritoneal nodularity. Stable calcification in the left lower quadrant anterior to the sigmoid colon. Musculoskeletal: No acute or significant osseous findings. Review of the MIP images confirms the above findings. IMPRESSION: 1. No evidence of acute pulmonary embolism or other acute chest process. 2. Known hypermetabolic left lung nodules consistent with metastatic disease. The dominant perihilar lingular component has mildly enlarged. 3. No acute findings in the abdomen or pelvis. 4. Bilateral nephrolithiasis and renal cortical thinning consistent with chronic renal failure. 5. Aortic Atherosclerosis (ICD10-I70.0) and Emphysema (ICD10-J43.9). Electronically Signed   By: Richardean Sale M.D.   On: 11/18/2019 13:16   DG Chest Port 1 View  Result Date: 11/18/2019 CLINICAL DATA:  Former smoker with chest pain. History of emphysema. Hypertension. EXAM: PORTABLE CHEST 1 VIEW COMPARISON:  CT chest 10/18/2019 FINDINGS: Normal heart size. Pulmonary vascular congestion. No pleural effusion or overt edema. No acute airspace consolidation. Left mid lung nodule corresponding to FDG avid 2.3 cm left upper lobe lung nodule seen on previous CT of the  chest is unchanged and remains worrisome for primary bronchogenic carcinoma. The lungs appear hyperinflated and there are diffuse interstitial changes compatible with emphysema. IMPRESSION: 1. Pulmonary vascular congestion without overt edema or pleural effusion. 2. Left upper lobe lung nodule is unchanged and remains worrisome for primary bronchogenic carcinoma. 3.  Emphysema (ICD10-J43.9). Electronically Signed   By: Kerby Moors M.D.   On: 11/18/2019 11:17        Scheduled Meds: . apixaban  2.5 mg Oral BID  . [START ON 11/21/2019] calcitRIOL  0.25 mcg Oral Q M,W,F-HD  . calcium acetate  667 mg Oral TID WC  . Chlorhexidine Gluconate Cloth  6 each Topical Q0600  . diltiazem  30 mg Oral Q8H  . fluticasone  1-2 spray Each Nare Daily  . fluticasone furoate-vilanterol  1 puff Inhalation Daily  . levothyroxine  125 mcg Oral QAC breakfast  . [START ON 11/21/2019] lidocaine-prilocaine  1 application Topical Q M,W,F-HD  . mouth rinse  15 mL Mouth Rinse BID  . midodrine  10 mg Oral TID WC  . pantoprazole (PROTONIX) IV  40 mg Intravenous Q12H  . predniSONE  5 mg Oral Q breakfast  . umeclidinium bromide  1 puff Inhalation Daily   Continuous Infusions: . dextrose 50 mL/hr at 11/18/19 1649  LOS: 0 days    Time spent: 35 minutes    Dana Allan, MD  [facility] Pager #: [phone] 7PM-7AM contact night coverage as above

## 2019-11-19 NOTE — Consult Note (Signed)
Reason for Consult: To manage dialysis and dialysis related needs Referring Physician: Aleesha Ringstad is an 74 y.o. female with past medical history significant for Crohns, COPD on chronic O2, ESRD-  [facility] on MWF- compliant and anemia, chronic lowish BPS - low 100's. Issues of late include a hypermetabolic lung nodule and has had recent SVT.  She came to the hospital on the AM of 6/25 with c/o abdominal pain-  Was found to have atrial arrythmia-  Quickly treated with cardizem- had been taking metoprolol as OP but only for 2 doses.  BP was low.  Felt better after some fluid- k was 3.2  Given supp. Plan was for HD today.  Pt tells me today that she had more abdominal pain overnight-  Of note this pain is not new... similar to Crohn pain she has had in the past-  Usually treated with steroids.  BP is still soft but she has not had any recurrence of her atrial arrythmia    Dialyzes at [facility]-  MWF  EDW 50.5. profile #3 [facility] [place] 3/2.25, Dialyzer 180, Heparin yess 4200. Access left AVG.hect 4-- last hgb 11.3- calc 8.8 phos 7.8  Past Medical History:  Diagnosis Date  . Anal fistula   . Anemia in chronic kidney disease (CKD)   . Aortic insufficiency    Echo 3/18: mod conc LVH, EF 60-65, no RWMA, Gr 1 DD, mod AI, mild MR, normal RVSF, Trivial TR  . Arthritis   . Borderline hypertension   . Bulging disc    L3-L4  . Chronic diarrhea    due to crohn's  . CKD (chronic kidney disease), stage IV ([place])    MWF- [address]  . Crohn's disease ([place])    chronic ileitis  . Dyspnea    with exertion  . Emphysema/COPD ([place])   . GERD (gastroesophageal reflux disease)    denies  . History of blood transfusion   . History of glaucoma   . History of kidney stones   . History of pancreatitis    2008--  mercaptopurine  . History of small bowel obstruction    12-03-2010  due to crohn's ileitis  . Hypertension    no longer on medications  . Hypothyroidism, postsurgical    multinodule w/ hurthle  cells  . Osteoporosis   . PAF (paroxysmal atrial fibrillation) ([place])   . Perianal Crohn's disease ([place])   . Peripheral vascular disease ([place])    blood clot behind knee left leg  . Polyarthralgia   . Seizures ([place])    03/2017  . Wears partial dentures    upper    Past Surgical History:  Procedure Laterality Date  . ABDOMINAL HYSTERECTOMY  1990   and  Appendectomy  . AV FISTULA PLACEMENT Left 12/08/2017   Procedure: INSERTION OF ARTERIOVENOUS (AV) GRAFT WITH ARTEGRAFT TO LEFT UPPER ARM;  Surgeon: Conrad Many Farms, MD;  Location: [facility];  Service: Vascular;  Laterality: Left;  . BASCILIC VEIN TRANSPOSITION Left 01/09/2017   Procedure: LEFT 1ST STAGE BRACHIAL VEIN TRANSPOSITION;  Surgeon: Conrad Powhatan, MD;  Location: [facility];  Service: Vascular;  Laterality: Left;  . BASCILIC VEIN TRANSPOSITION Left 06/16/2017   Procedure: Second Stage BASILIC VEIN TRANSPOSITION  LEFT ARM;  Surgeon: Conrad , MD;  Location: [facility];  Service: Vascular;  Laterality: Left;  . BIOPSY  03/09/2018   Procedure: BIOPSY;  Surgeon: Wilford Corner, MD;  [facility];  Service: Endoscopy;;  . CHOLECYSTECTOMY    .  COLON RESECTION  x3 --  1978,  1987, 1989   ILEAL RESECTION x2/   [place]  . COLONOSCOPY WITH PROPOFOL N/A 09/25/2014   Procedure: COLONOSCOPY WITH PROPOFOL;  Surgeon: Garlan Fair, MD;  [facility];  Service: Endoscopy;  Laterality: N/A;  . COLONOSCOPY WITH PROPOFOL N/A 03/09/2018   Procedure: COLONOSCOPY WITH PROPOFOL;  Surgeon: Wilford Corner, MD;  [facility];  Service: Endoscopy;  Laterality: N/A;  . CYSTOSCOPY W/ URETERAL STENT PLACEMENT Right 11/19/2016   Procedure: CYSTOSCOPY WITH RIGHT RETROGRADE PYELOGRAM/ URETEROSCOPY WITH LASER AND RIGHT URETERAL STENT PLACEMENT;  Surgeon: Ardis Hughs, MD;  [facility];  Service: Urology;  Laterality: Right;  . ESOPHAGOGASTRODUODENOSCOPY  03/04/2012   Procedure: ESOPHAGOGASTRODUODENOSCOPY (EGD);   Surgeon: Garlan Fair, MD;  [facility];  Service: Endoscopy;  Laterality: N/A;  . EXAMINATION UNDER ANESTHESIA N/A 09/12/2014   Procedure: EXAM UNDER ANESTHESIA;  Surgeon: Rolm Bookbinder, MD;  Location: [facility];  Service: General;  Laterality: N/A;  . EYE SURGERY     laser  . FLEXIBLE SIGMOIDOSCOPY  03/04/2012   Procedure: FLEXIBLE SIGMOIDOSCOPY;  Surgeon: Garlan Fair, MD;  [facility];  Service: Endoscopy;  Laterality: N/A;  . GLAUCOMA SURGERY Bilateral   . HOLMIUM LASER APPLICATION Right 1/60/7371   Procedure: HOLMIUM LASER APPLICATION;  Surgeon: Ardis Hughs, MD;  [facility];  Service: Urology;  Laterality: Right;  . INCISION AND DRAINAGE PERIRECTAL ABSCESS N/A 09/12/2014   Procedure: IRRIGATION AND DEBRIDEMENT PERIRECTAL ABSCESS;  Surgeon: Rolm Bookbinder, MD;  Location: [facility];  Service: General;  Laterality: N/A;  . IR FLUORO GUIDE CV LINE RIGHT  03/20/2017  . IR US GUIDE VASC ACCESS RIGHT  03/20/2017  . NEGATIVE SLEEP STUDY  2008  . PLACEMENT OF SETON N/A 12/01/2014   Procedure: PLACEMENT OF SETON;  Surgeon: [name], MD;  Location: [facility];  Service: General;  Laterality: N/A;  . POLYPECTOMY  03/09/2018   Procedure: POLYPECTOMY;  Surgeon: Wilford Corner, MD;  [facility];  Service: Endoscopy;;  . RECTAL EXAM UNDER ANESTHESIA N/A 12/01/2014   Procedure: RECTAL EXAM UNDER ANESTHESIA;  Surgeon: [name], MD;  Location: [facility];  Service: General;  Laterality: N/A;  . TOTAL THYROIDECTOMY  10-13-2003  . TRANSTHORACIC ECHOCARDIOGRAM  07-11-2013   mild LVH,  ef 55%,  moderate AR,  mild MR and TR,  trivial PR  . TUBAL LIGATION      Family History  Problem Relation Age of Onset  . Hypertension Father   . Heart disease Father   . Heart attack Father   . Diverticulitis Mother   . COPD Mother   . Hypertension Mother   . Heart disease Mother   . Heart attack Mother   . Heart attack  Brother   . Colon cancer Brother   . Diabetes Brother   . Thyroid disease Brother     Social History:  reports that she quit smoking about 1 years ago. Her smoking use included cigarettes. She started smoking about 52 years ago. She has a 35.00 pack-year smoking history. She has never used smokeless tobacco. She reports that she does not drink alcohol and does not use drugs.  Allergies:  Allergies  Allergen Reactions  . Mercaptopurine Other (See Comments)    Caused pancreatitis  . Remicade [Infliximab] Other (See Comments)    CAUSED JOINT PAIN    Medications: I have reviewed the patient's current medications.   Results for  orders placed or performed during the hospital encounter of 11/18/19 (from the past 48 hour(s))  CBG monitoring, ED     Status: Abnormal   Collection Time: 11/18/19 10:42 AM  Result Value Ref Range   Glucose-Capillary 58 (L) 70 - 99 mg/dL    Comment: Glucose reference range applies only to samples taken after fasting for at least 8 hours.  Troponin I (High Sensitivity)     Status: Abnormal   Collection Time: 11/18/19 11:19 AM  Result Value Ref Range   Troponin I (High Sensitivity) 46 (H) <18 ng/L    Comment: (NOTE) Elevated high sensitivity troponin I (hsTnI) values and significant  changes across serial measurements may suggest ACS but many other  chronic and acute conditions are known to elevate hsTnI results.  Refer to the "Links" section for chest pain algorithms and additional  guidance. Performed at [facility], [place] [address] [place], [place] [address]   CBC     Status: Abnormal   Collection Time: 11/18/19 11:19 AM  Result Value Ref Range   WBC 4.2 4.0 - 10.5 K/uL   RBC 3.36 (L) 3.87 - 5.11 MIL/uL   Hemoglobin 10.6 (L) 12.0 - 15.0 g/dL   HCT 36.5 36 - 46 %   MCV 108.6 (H) 80.0 - 100.0 fL   MCH 31.5 26.0 - 34.0 pg   MCHC 29.0 (L) 30.0 - 36.0 g/dL   RDW 14.6 11.5 - 15.5 %   Platelets 102 (L) 150 - 400 K/uL    Comment: Immature  Platelet Fraction may be clinically indicated, consider ordering this additional test [id number] PLATELET COUNT CONFIRMED BY SMEAR REPEATED TO VERIFY    nRBC 0.0 0.0 - 0.2 %    Comment: Performed at [facility], [place] [address], [place], [place] [address]  Comprehensive metabolic panel     Status: Abnormal   Collection Time: 11/18/19 11:19 AM  Result Value Ref Range   Sodium 138 135 - 145 mmol/L   Potassium 3.2 (L) 3.5 - 5.1 mmol/L   Chloride 105 98 - 111 mmol/L   CO2 20 (L) 22 - 32 mmol/L   Glucose, Bld 194 (H) 70 - 99 mg/dL    Comment: Glucose reference range applies only to samples taken after fasting for at least 8 hours.   BUN 36 (H) 8 - 23 mg/dL   Creatinine, Ser 7.98 (H) 0.44 - 1.00 mg/dL   Calcium 8.3 (L) 8.9 - 10.3 mg/dL   Total Protein 5.6 (L) 6.5 - 8.1 g/dL   Albumin 2.6 (L) 3.5 - 5.0 g/dL   AST 18 15 - 41 U/L   ALT 11 0 - 44 U/L   Alkaline Phosphatase 60 38 - 126 U/L   Total Bilirubin 0.2 (L) 0.3 - 1.2 mg/dL   GFR calc non Af Amer 5 (L) >60 mL/min   GFR calc Af Amer 5 (L) >60 mL/min   Anion gap 13 5 - 15    Comment: Performed at [facility] [address], [place], [place] [address]  Lipase, blood     Status: Abnormal   Collection Time: 11/18/19 11:19 AM  Result Value Ref Range   Lipase 56 (H) 11 - 51 U/L    Comment: Performed at [facility] [address] [place], [place] [address]  Lactic acid, plasma     Status: None   Collection Time: 11/18/19 11:20 AM  Result Value Ref Range   Lactic Acid, Venous 1.6 0.5 -  1.9 mmol/L    Comment: Performed at [facility], [place] [address] [place], [place] [address]  Troponin I (High Sensitivity)     Status: Abnormal   Collection Time: 11/18/19  1:21 PM  Result Value Ref Range   Troponin I (High Sensitivity) 95 (H) <18 ng/L    Comment: RESULT CALLED TO, READ BACK BY AND VERIFIED WITH: B.MICHAELSON,RN @1438  11/18/19 VANG.J (NOTE) Elevated high sensitivity troponin I (hsTnI) values and significant   changes across serial measurements may suggest ACS but many other  chronic and acute conditions are known to elevate hsTnI results.  Refer to the Links section for chest pain algorithms and additional  guidance. Performed at [facility], [place] [address] [place], [place] [address]   CBG monitoring, ED     Status: Abnormal   Collection Time: 11/18/19  2:27 PM  Result Value Ref Range   Glucose-Capillary 55 (L) 70 - 99 mg/dL    Comment: Glucose reference range applies only to samples taken after fasting for at least 8 hours.  SARS Coronavirus 2 by RT PCR (hospital order, performed in [facility] hospital lab) Nasopharyngeal Nasopharyngeal Swab     Status: None   Collection Time: 11/18/19  2:31 PM   Specimen: Nasopharyngeal Swab  Result Value Ref Range   SARS Coronavirus 2 NEGATIVE NEGATIVE    Comment: (NOTE) SARS-CoV-2 target nucleic acids are NOT DETECTED.  The SARS-CoV-2 RNA is generally detectable in upper and lower respiratory specimens during the acute phase of infection. The lowest concentration of SARS-CoV-2 viral copies this assay can detect is 250 copies / mL. A negative result does not preclude SARS-CoV-2 infection and should not be used as the sole basis for treatment or other patient management decisions.  A negative result may occur with improper specimen collection / handling, submission of specimen other than nasopharyngeal swab, presence of viral mutation(s) within the areas targeted by this assay, and inadequate number of viral copies (<250 copies / mL). A negative result must be combined with clinical observations, patient history, and epidemiological information.  Fact Sheet for Patients:   StrictlyIdeas.no  Fact Sheet for Healthcare Providers: BankingDealers.co.za  This test is not yet approved or  cleared by the [place] FDA and has been authorized for detection and/or diagnosis of SARS-CoV-2 by FDA  under an Emergency Use Authorization (EUA).  This EUA will remain in effect (meaning this test can be used) for the duration of the COVID-19 declaration under Section 564(b)(1) of the Act, 21 U.S.C. section 360bbb-3(b)(1), unless the authorization is terminated or revoked sooner.  Performed at [facility], [place] [address] [place], [place] [address]   CBG monitoring, ED     Status: Abnormal   Collection Time: 11/18/19  3:57 PM  Result Value Ref Range   Glucose-Capillary 143 (H) 70 - 99 mg/dL    Comment: Glucose reference range applies only to samples taken after fasting for at least 8 hours.  Glucose, capillary     Status: Abnormal   Collection Time: 11/18/19  4:42 PM  Result Value Ref Range   Glucose-Capillary 107 (H) 70 - 99 mg/dL    Comment: Glucose reference range applies only to samples taken after fasting for at least 8 hours.  Lactic acid, plasma     Status: None   Collection Time: 11/18/19  5:03 PM  Result Value Ref Range   Lactic Acid, Venous 0.8 0.5 - 1.9 mmol/L    Comment: Performed at [facility], [place] [address], [place], [place] [address]  Hemoglobin A1c     Status: None   Collection Time: 11/18/19  5:03 PM  Result Value Ref Range   Hgb A1c MFr Bld 5.0 4.8 - 5.6 %    Comment: (NOTE) Pre diabetes:          5.7%-6.4%  Diabetes:              >6.4%  Glycemic control for   <7.0% adults with diabetes    Mean Plasma Glucose 96.8 mg/dL    Comment: Performed at [facility] [address], [place], [place] [address]  TSH     Status: None   Collection Time: 11/18/19  5:03 PM  Result Value Ref Range   TSH 1.499 0.350 - 4.500 uIU/mL    Comment: Performed by a 3rd Generation assay with a functional sensitivity of <=0.01 uIU/mL. Performed at [facility], [place] [address] [place], [place] [address]   Glucose, capillary     Status: None   Collection Time: 11/18/19  9:31 PM  Result Value Ref Range   Glucose-Capillary 87 70 - 99 mg/dL    Comment:  Glucose reference range applies only to samples taken after fasting for at least 8 hours.  Urinalysis, Routine w reflex microscopic     Status: Abnormal   Collection Time: 11/18/19  9:38 PM  Result Value Ref Range   Color, Urine YELLOW YELLOW   APPearance CLEAR CLEAR   Specific Gravity, Urine 1.019 1.005 - 1.030   pH 6.0 5.0 - 8.0   Glucose, UA 50 (A) NEGATIVE mg/dL   Hgb urine dipstick MODERATE (A) NEGATIVE   Bilirubin Urine NEGATIVE NEGATIVE   Ketones, ur NEGATIVE NEGATIVE mg/dL   Protein, ur 30 (A) NEGATIVE mg/dL   Nitrite NEGATIVE NEGATIVE   Leukocytes,Ua NEGATIVE NEGATIVE   RBC / HPF 11-20 0 - 5 RBC/hpf   WBC, UA 0-5 0 - 5 WBC/hpf   Bacteria, UA NONE SEEN NONE SEEN   Squamous Epithelial / LPF 0-5 0 - 5   Mucus PRESENT     Comment: Performed at [facility], [place] [address], [place], [place] [address]  Glucose, capillary     Status: Abnormal   Collection Time: 11/19/19 12:56 AM  Result Value Ref Range   Glucose-Capillary 115 (H) 70 - 99 mg/dL    Comment: Glucose reference range applies only to samples taken after fasting for at least 8 hours.  Glucose, capillary     Status: None   Collection Time: 11/19/19  3:10 AM  Result Value Ref Range   Glucose-Capillary 82 70 - 99 mg/dL    Comment: Glucose reference range applies only to samples taken after fasting for at least 8 hours.  MRSA PCR Screening     Status: None   Collection Time: 11/19/19  6:16 AM   Specimen: Nasopharyngeal  Result Value Ref Range   MRSA by PCR NEGATIVE NEGATIVE    Comment:        The GeneXpert MRSA Assay (FDA approved for NASAL specimens only), is one component of a comprehensive MRSA colonization surveillance program. It is not intended to diagnose MRSA infection nor to guide or monitor treatment for MRSA infections. Performed at [facility], [place] [address] [place], [place] [address]   CBC     Status: Abnormal   Collection Time: 11/19/19  6:52 AM  Result Value Ref Range   WBC 5.6  4.0 -  10.5 K/uL   RBC 3.36 (L) 3.87 - 5.11 MIL/uL   Hemoglobin 10.4 (L) 12.0 - 15.0 g/dL   HCT 35.8 (L) 36 - 46 %   MCV 106.5 (H) 80.0 - 100.0 fL   MCH 31.0 26.0 - 34.0 pg   MCHC 29.1 (L) 30.0 - 36.0 g/dL   RDW 14.6 11.5 - 15.5 %   Platelets 111 (L) 150 - 400 K/uL    Comment: SPECIMEN CHECKED FOR CLOTS CONSISTENT WITH PREVIOUS RESULT Immature Platelet Fraction may be clinically indicated, consider ordering this additional test [id number] REPEATED TO VERIFY    nRBC 0.0 0.0 - 0.2 %    Comment: Performed at [facility], [place] [address] [place], [place] [address]  Troponin I (High Sensitivity)     Status: Abnormal   Collection Time: 11/19/19  7:08 AM  Result Value Ref Range   Troponin I (High Sensitivity) 111 (HH) <18 ng/L    Comment: CRITICAL RESULT CALLED TO, READ BACK BY AND VERIFIED WITH: B.JOHNSON RN @ 0901 11/19/2019 BY C.EDENS (NOTE) Elevated high sensitivity troponin I (hsTnI) values and significant  changes across serial measurements may suggest ACS but many other  chronic and acute conditions are known to elevate hsTnI results.  Refer to the Links section for chest pain algorithms and additional  guidance. Performed at [facility], [place] [address], [place], [place] [address]   Renal function panel     Status: Abnormal   Collection Time: 11/19/19  7:08 AM  Result Value Ref Range   Sodium 135 135 - 145 mmol/L   Potassium 4.1 3.5 - 5.1 mmol/L   Chloride 102 98 - 111 mmol/L   CO2 20 (L) 22 - 32 mmol/L   Glucose, Bld 74 70 - 99 mg/dL    Comment: Glucose reference range applies only to samples taken after fasting for at least 8 hours.   BUN 41 (H) 8 - 23 mg/dL   Creatinine, Ser 9.20 (H) 0.44 - 1.00 mg/dL   Calcium 8.5 (L) 8.9 - 10.3 mg/dL   Phosphorus 7.0 (H) 2.5 - 4.6 mg/dL   Albumin 2.5 (L) 3.5 - 5.0 g/dL   GFR calc non Af Amer 4 (L) >60 mL/min   GFR calc Af Amer 4 (L) >60 mL/min   Anion gap 13 5 - 15    Comment: Performed at [facility] [address], [place], [place] [address]    CT Angio Chest PE W and/or Wo Contrast  Result Date: 11/18/2019 CLINICAL DATA:  Shortness of breath with nausea, dizziness and abdominal pain. Atrial fibrillation with rapid ventricular response. Clinical concern for acute pulmonary embolism. Chronic kidney disease on hemodialysis. EXAM: CT ANGIOGRAPHY CHEST CT ABDOMEN AND PELVIS WITH CONTRAST TECHNIQUE: Multidetector CT imaging of the chest was performed using the standard protocol during bolus administration of intravenous contrast. Multiplanar CT image reconstructions and MIPs were obtained to evaluate the vascular anatomy. Multidetector CT imaging of the abdomen and pelvis was performed using the standard protocol during bolus administration of intravenous contrast. CONTRAST:  1100m OMNIPAQUE IOHEXOL 350 MG/ML SOLN COMPARISON:  PET-CT 11/01/2019. Chest CT 10/18/2019 and abdominopelvic CT 09/20/2019. FINDINGS: CTA CHEST FINDINGS Cardiovascular: The pulmonary arteries are well opacified with contrast to the level of the subsegmental branches. There is no evidence of acute pulmonary embolism. Stable mild central enlargement of the pulmonary arteries and mild atherosclerosis of the aorta, great vessels and coronary arteries. Left axillary and brachial stent noted. The heart size is  normal. There is no pericardial effusion. Mediastinum/Nodes: Mildly prominent left hilar lymph nodes are grossly stable. There are no enlarged mediastinal or hilar lymph nodes. Previous thyroidectomy. The trachea and esophagus demonstrate no significant findings. Lungs/Pleura: No pleural effusion or pneumothorax. Mild dependent atelectasis at both lung bases. Moderate centrilobular and paraseptal emphysema. Known hypermetabolic left lung nodules are again noted with slight enlargement of the dominant perihilar lingular component measuring up to 2.2 cm on image 41/7 (previously 2.0 cm). Additional nodules are present the left lung, including a  9 mm left upper lobe nodule on image 24/7 and a spiculated left upper lobe nodule measuring 10 mm on image 26/7. No suspicious right lung nodules. Musculoskeletal/Chest wall: Inferior endplate lucency and surrounding sclerosis again noted within the T10 vertebral body. Although hypermetabolic on prior PET-CT, the appearance is most typical of a Schmorl's node. No acute osseous findings. CT ABDOMEN AND PELVIS FINDINGS Hepatobiliary: The liver is normal in density without suspicious focal abnormality. Probable focal fat adjacent to the falciform ligament. Stable chronic mild intrahepatic and extrahepatic biliary dilatation post cholecystectomy. 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 No evidence of pancreatic mass or surrounding inflammation. Spleen: Normal in size without focal abnormality. Adrenals/Urinary Tract: Both adrenal glands appear normal. Numerous bilateral renal calculi and low-density renal lesions are again noted, similar to previous study. There is bilateral renal cortical thinning and absent contrast excretion consistent with chronic renal failure. No hydronephrosis. No significant bladder findings. 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 No small bowel abnormalities are identified status post right hemicolectomy and anastomosis. There are diverticular changes in the descending and sigmoid colon with possible mild sigmoid colon wall thickening, but no surrounding inflammation or significant distension. Vascular/Lymphatic: There are no enlarged abdominal or pelvic lymph nodes. Moderate aortic and branch vessel atherosclerosis without acute vascular findings. There is mild reflux of contrast into the IVC and hepatic veins. The portal, superior mesenteric and splenic veins are patent. Reproductive: Hysterectomy. No adnexal mass. Stable curvilinear calcification between the vagina and rectum. Other:  Intact abdominal wall. No ascites or peritoneal nodularity. Stable calcification in the left lower quadrant anterior to the sigmoid colon. Musculoskeletal: No acute or significant osseous findings. Review of the MIP images confirms the above findings. IMPRESSION: 1. No evidence of acute pulmonary embolism or other acute chest process. 2. Known hypermetabolic left lung nodules consistent with metastatic disease. The dominant perihilar lingular component has mildly enlarged. 3. No acute findings in the abdomen or pelvis. 4. Bilateral nephrolithiasis and renal cortical thinning consistent with chronic renal failure. 5. Aortic Atherosclerosis (ICD10-I70.0) and Emphysema (ICD10-J43.9). Electronically Signed   By: Richardean Sale M.D.   On: 11/18/2019 13:16   CT ABDOMEN PELVIS W CONTRAST  Result Date: 11/18/2019 CLINICAL DATA:  Shortness of breath with nausea, dizziness and abdominal pain. Atrial fibrillation with rapid ventricular response. Clinical concern for acute pulmonary embolism. Chronic kidney disease on hemodialysis. EXAM: CT ANGIOGRAPHY CHEST CT ABDOMEN AND PELVIS WITH CONTRAST TECHNIQUE: Multidetector CT imaging of the chest was performed using the standard protocol during bolus administration of intravenous contrast. Multiplanar CT image reconstructions and MIPs were obtained to evaluate the vascular anatomy. Multidetector CT imaging of the abdomen and pelvis was performed using the standard protocol during bolus administration of intravenous contrast. CONTRAST:  187m OMNIPAQUE IOHEXOL 350 MG/ML SOLN COMPARISON:  PET-CT 11/01/2019. Chest CT 10/18/2019 and abdominopelvic CT 09/20/2019. FINDINGS: CTA CHEST FINDINGS Cardiovascular: The pulmonary arteries are well opacified with  contrast to the level of the subsegmental branches. There is no evidence of acute pulmonary embolism. Stable mild central enlargement of the pulmonary arteries and mild atherosclerosis of the aorta, great vessels and coronary  arteries. Left axillary and brachial stent noted. The heart size is normal. There is no pericardial effusion. Mediastinum/Nodes: Mildly prominent left hilar lymph nodes are grossly stable. There are no enlarged mediastinal or hilar lymph nodes. Previous thyroidectomy. The trachea and esophagus demonstrate no significant findings. Lungs/Pleura: No pleural effusion or pneumothorax. Mild dependent atelectasis at both lung bases. Moderate centrilobular and paraseptal emphysema. Known hypermetabolic left lung nodules are again noted with slight enlargement of the dominant perihilar lingular component measuring up to 2.2 cm on image 41/7 (previously 2.0 cm). Additional nodules are present the left lung, including a 9 mm left upper lobe nodule on image 24/7 and a spiculated left upper lobe nodule measuring 10 mm on image 26/7. No suspicious right lung nodules. Musculoskeletal/Chest wall: Inferior endplate lucency and surrounding sclerosis again noted within the T10 vertebral body. Although hypermetabolic on prior PET-CT, the appearance is most typical of a Schmorl's node. No acute osseous findings. CT ABDOMEN AND PELVIS FINDINGS Hepatobiliary: The liver is normal in density without suspicious focal abnormality. Probable focal fat adjacent to the falciform ligament. Stable chronic mild intrahepatic and extrahepatic biliary dilatation post cholecystectomy. 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 No evidence of pancreatic mass or surrounding inflammation. Spleen: Normal in size without focal abnormality. Adrenals/Urinary Tract: Both adrenal glands appear normal. Numerous bilateral renal calculi and low-density renal lesions are again noted, similar to previous study. There is bilateral renal cortical thinning and absent contrast excretion consistent with chronic renal failure. No hydronephrosis. No significant bladder findings. Stomach/Bowel: Gastric fundal diverticulum again  noted with incomplete distention of the stomach. No small bowel abnormalities are identified status post right hemicolectomy and anastomosis. There are diverticular changes in the descending and sigmoid colon with possible mild sigmoid colon wall thickening, but no surrounding inflammation or significant distension. Vascular/Lymphatic: There are no enlarged abdominal or pelvic lymph nodes. Moderate aortic and branch vessel atherosclerosis without acute vascular findings. There is mild reflux of contrast into the IVC and hepatic veins. The portal, superior mesenteric and splenic veins are patent. Reproductive: Hysterectomy. No adnexal mass. Stable curvilinear calcification between the vagina and rectum. Other: Intact abdominal wall. No ascites or peritoneal nodularity. Stable calcification in the left lower quadrant anterior to the sigmoid colon. Musculoskeletal: No acute or significant osseous findings. Review of the MIP images confirms the above findings. IMPRESSION: 1. No evidence of acute pulmonary embolism or other acute chest process. 2. Known hypermetabolic left lung nodules consistent with metastatic disease. The dominant perihilar lingular component has mildly enlarged. 3. No acute findings in the abdomen or pelvis. 4. Bilateral nephrolithiasis and renal cortical thinning consistent with chronic renal failure. 5. Aortic Atherosclerosis (ICD10-I70.0) and Emphysema (ICD10-J43.9). Electronically Signed   By: Richardean Sale M.D.   On: 11/18/2019 13:16   DG Chest Port 1 View  Result Date: 11/18/2019 CLINICAL DATA:  Former smoker with chest pain. History of emphysema. Hypertension. EXAM: PORTABLE CHEST 1 VIEW COMPARISON:  CT chest 10/18/2019 FINDINGS: Normal heart size. Pulmonary vascular congestion. No pleural effusion or overt edema. No acute airspace consolidation. Left mid lung nodule corresponding to FDG avid 2.3 cm left upper lobe lung nodule seen on previous CT of the chest is unchanged and remains  worrisome for primary bronchogenic carcinoma. The lungs appear hyperinflated and there  are diffuse interstitial changes compatible with emphysema. IMPRESSION: 1. Pulmonary vascular congestion without overt edema or pleural effusion. 2. Left upper lobe lung nodule is unchanged and remains worrisome for primary bronchogenic carcinoma. 3.  Emphysema (ICD10-J43.9). Electronically Signed   By: Kerby Moors M.D.   On: 11/18/2019 11:17    ROS: episodic RLQ abd pain-   Will awaken from sleep-  dizziness Blood pressure (!) 104/52, pulse 80, temperature 98 F (36.7 C), temperature source Oral, resp. rate 20, height 5' 4"  (1.626 m), weight 51.6 kg, SpO2 96 %. General appearance: alert and mild distress Resp: diminished breath sounds bilaterally Cardio: regular rate and rhythm, S1, S2 normal, no murmur, click, rub or gallop GI: right sided tenderness with some guarding  Extremities: extremities normal, atraumatic, no cyanosis or edema left upper AVG- patent   Assessment/Plan: 74 year old BF- with ESRD, COPD and Crohns-  hosp with abdominal pain-  Low BP and atrial arrythmias of late 1 abdominal pain-  Work up per primary team-  CT is unremarkable-  I was concerned about the possibility of ischemic bowel in the setting of low BP- initial lactate 1.6- but later 0.8. She indicates that she has had this pain before felt to be associated with crohns and treated with steroids-  Unsure if this is worth doing-  Had recurrence last night 2 ESRD: normally MWF via AVG. Did not have indications yesterday-  Planning on HD today off schedule- gentle to no volume off- 3 Hypertension: BP is low-  Difficult b/c any med to treat this atrial arrythmia issue will lower BP.  On usual midodrine-  Will inc to max dose  4. 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 Metabolic Bone Disease: on home calcitrol and phoslo   Louis Meckel 11/19/2019, 9:23 AM

## 2019-11-19 NOTE — Progress Notes (Addendum)
Progress Note  Patient Name: Natasha [name] Date of Encounter: 11/19/2019  [facility] Cardiologist: Mertie Moores, MD   Subjective   Continues to have abdominal pain and increased stool frequency. No bleeding. No arrhythmia overnight and no other CV complaints. BP 104/52. Minimal abnormality in troponin.  Inpatient Medications    Scheduled Meds: . apixaban  2.5 mg Oral BID  . [START ON 11/21/2019] calcitRIOL  0.25 mcg Oral Q M,W,F-HD  . calcium acetate  667 mg Oral TID WC  . Chlorhexidine Gluconate Cloth  6 each Topical Q0600  . diltiazem  30 mg Oral Q8H  . fluticasone  1-2 spray Each Nare Daily  . fluticasone furoate-vilanterol  1 puff Inhalation Daily  . levothyroxine  125 mcg Oral QAC breakfast  . [START ON 11/21/2019] lidocaine-prilocaine  1 application Topical Q M,W,F-HD  . mouth rinse  15 mL Mouth Rinse BID  . midodrine  10 mg Oral TID WC  . pantoprazole (PROTONIX) IV  40 mg Intravenous Q12H  . predniSONE  5 mg Oral Q breakfast  . umeclidinium bromide  1 puff Inhalation Daily   Continuous Infusions: . dextrose 50 mL/hr at 11/18/19 1649   PRN Meds: acetaminophen **OR** acetaminophen, alum & mag hydroxide-simeth, loperamide, ondansetron **OR** ondansetron (ZOFRAN) IV   Vital Signs    Vitals:   11/19/19 0553 11/19/19 0842 11/19/19 0844 11/19/19 0854  BP: (!) 89/56   (!) 104/52  Pulse: 69   80  Resp:    20  Temp:    98 F (36.7 C)  TempSrc:    Oral  SpO2:  100% 100% 96%  Weight:      Height:        Intake/Output Summary (Last 24 hours) at 11/19/2019 1200 Last data filed at 11/19/2019 0900 Gross per 24 hour  Intake 1323.85 ml  Output 200 ml  Net 1123.85 ml   Last 3 Weights 11/19/2019 11/18/2019 11/17/2019  Weight (lbs) 113 lb 12.8 oz 111 lb 12.8 oz 111 lb 12.8 oz  Weight (kg) 51.619 kg 50.712 kg 50.712 kg      Telemetry    NSR - Personally Reviewed  ECG    No new tracing - Personally Reviewed  Physical Exam  Appears worried GEN: No acute  distress.   Neck: No JVD Cardiac: RRR, 2/6 aortic ejection murmur, early peakingt, no diastolic murmurs, rubs, or gallops; large, well-developed AV fistula left upper extremity with excellent thrill and brui Respiratory: Clear to auscultation bilaterally. GI: Soft, mildly tender diffusely, non-distended  MS: No edema; No deformity. Neuro:  Nonfocal  Psych: Normal affect   Labs    High Sensitivity Troponin:   Recent Labs  Lab 11/18/19 1119 11/18/19 1321 11/19/19 0708  TROPONINIHS 46* 95* 111*      Chemistry Recent Labs  Lab 11/15/19 1317 11/18/19 1119 11/19/19 0708  NA 140 138 135  K 3.0* 3.2* 4.1  CL 103 105 102  CO2 24 20* 20*  GLUCOSE 75 194* 74  BUN 23 36* 41*  CREATININE 6.57* 7.98* 9.20*  CALCIUM 8.7* 8.3* 8.5*  PROT 6.8 5.6*  --   ALBUMIN 3.1* 2.6* 2.5*  AST 16 18  --   ALT 10 11  --   ALKPHOS 82 60  --   BILITOT 0.6 0.2*  --   GFRNONAA 6* 5* 4*  GFRAA 7* 5* 4*  ANIONGAP 13 13 13      Hematology Recent Labs  Lab 11/15/19 1317 11/18/19 1119 11/19/19 0652  WBC 5.1  4.2 5.6  RBC 3.63* 3.36* 3.36*  HGB 11.6* 10.6* 10.4*  HCT 38.3 36.5 35.8*  MCV 105.5* 108.6* 106.5*  MCH 32.0 31.5 31.0  MCHC 30.3 29.0* 29.1*  RDW 14.7 14.6 14.6  PLT 112* 102* 111*    BNPNo results for input(s): BNP, PROBNP in the last 168 hours.   DDimer No results for input(s): DDIMER in the last 168 hours.   Radiology    CT Angio Chest PE W and/or Wo Contrast  Result Date: 11/18/2019 CLINICAL DATA:  Shortness of breath with nausea, dizziness and abdominal pain. Atrial fibrillation with rapid ventricular response. Clinical concern for acute pulmonary embolism. Chronic kidney disease on hemodialysis. EXAM: CT ANGIOGRAPHY CHEST CT ABDOMEN AND PELVIS WITH CONTRAST TECHNIQUE: Multidetector CT imaging of the chest was performed using the standard protocol during bolus administration of intravenous contrast. Multiplanar CT image reconstructions and MIPs were obtained to evaluate the  vascular anatomy. Multidetector CT imaging of the abdomen and pelvis was performed using the standard protocol during bolus administration of intravenous contrast. CONTRAST:  142m OMNIPAQUE IOHEXOL 350 MG/ML SOLN COMPARISON:  PET-CT 11/01/2019. Chest CT 10/18/2019 and abdominopelvic CT 09/20/2019. FINDINGS: CTA CHEST FINDINGS Cardiovascular: The pulmonary arteries are well opacified with contrast to the level of the subsegmental branches. There is no evidence of acute pulmonary embolism. Stable mild central enlargement of the pulmonary arteries and mild atherosclerosis of the aorta, great vessels and coronary arteries. Left axillary and brachial stent noted. The heart size is normal. There is no pericardial effusion. Mediastinum/Nodes: Mildly prominent left hilar lymph nodes are grossly stable. There are no enlarged mediastinal or hilar lymph nodes. Previous thyroidectomy. The trachea and esophagus demonstrate no significant findings. Lungs/Pleura: No pleural effusion or pneumothorax. Mild dependent atelectasis at both lung bases. Moderate centrilobular and paraseptal emphysema. Known hypermetabolic left lung nodules are again noted with slight enlargement of the dominant perihilar lingular component measuring up to 2.2 cm on image 41/7 (previously 2.0 cm). Additional nodules are present the left lung, including a 9 mm left upper lobe nodule on image 24/7 and a spiculated left upper lobe nodule measuring 10 mm on image 26/7. No suspicious right lung nodules. Musculoskeletal/Chest wall: Inferior endplate lucency and surrounding sclerosis again noted within the T10 vertebral body. Although hypermetabolic on prior PET-CT, the appearance is most typical of a Schmorl's node. No acute osseous findings. CT ABDOMEN AND PELVIS FINDINGS Hepatobiliary: The liver is normal in density without suspicious focal abnormality. Probable focal fat adjacent to the falciform ligament. Stable chronic mild intrahepatic and extrahepatic  biliary dilatation post cholecystectomy. 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 No evidence of pancreatic mass or surrounding inflammation. Spleen: Normal in size without focal abnormality. Adrenals/Urinary Tract: Both adrenal glands appear normal. Numerous bilateral renal calculi and low-density renal lesions are again noted, similar to previous study. There is bilateral renal cortical thinning and absent contrast excretion consistent with chronic renal failure. No hydronephrosis. No significant bladder findings. 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 No small bowel abnormalities are identified status post right hemicolectomy and anastomosis. There are diverticular changes in the descending and sigmoid colon with possible mild sigmoid colon wall thickening, but no surrounding inflammation or significant distension. Vascular/Lymphatic: There are no enlarged abdominal or pelvic lymph nodes. Moderate aortic and branch vessel atherosclerosis without acute vascular findings. There is mild reflux of contrast into the IVC and hepatic veins. The portal, superior mesenteric and splenic veins are patent. Reproductive: Hysterectomy.  No adnexal mass. Stable curvilinear calcification between the vagina and rectum. Other: Intact abdominal wall. No ascites or peritoneal nodularity. Stable calcification in the left lower quadrant anterior to the sigmoid colon. Musculoskeletal: No acute or significant osseous findings. Review of the MIP images confirms the above findings. IMPRESSION: 1. No evidence of acute pulmonary embolism or other acute chest process. 2. Known hypermetabolic left lung nodules consistent with metastatic disease. The dominant perihilar lingular component has mildly enlarged. 3. No acute findings in the abdomen or pelvis. 4. Bilateral nephrolithiasis and renal cortical thinning consistent with chronic  renal failure. 5. Aortic Atherosclerosis (ICD10-I70.0) and Emphysema (ICD10-J43.9). Electronically Signed   By: Richardean Sale M.D.   On: 11/18/2019 13:16   CT ABDOMEN PELVIS W CONTRAST  Result Date: 11/18/2019 CLINICAL DATA:  Shortness of breath with nausea, dizziness and abdominal pain. Atrial fibrillation with rapid ventricular response. Clinical concern for acute pulmonary embolism. Chronic kidney disease on hemodialysis. EXAM: CT ANGIOGRAPHY CHEST CT ABDOMEN AND PELVIS WITH CONTRAST TECHNIQUE: Multidetector CT imaging of the chest was performed using the standard protocol during bolus administration of intravenous contrast. Multiplanar CT image reconstructions and MIPs were obtained to evaluate the vascular anatomy. Multidetector CT imaging of the abdomen and pelvis was performed using the standard protocol during bolus administration of intravenous contrast. CONTRAST:  177m OMNIPAQUE IOHEXOL 350 MG/ML SOLN COMPARISON:  PET-CT 11/01/2019. Chest CT 10/18/2019 and abdominopelvic CT 09/20/2019. FINDINGS: CTA CHEST FINDINGS Cardiovascular: The pulmonary arteries are well opacified with contrast to the level of the subsegmental branches. There is no evidence of acute pulmonary embolism. Stable mild central enlargement of the pulmonary arteries and mild atherosclerosis of the aorta, great vessels and coronary arteries. Left axillary and brachial stent noted. The heart size is normal. There is no pericardial effusion. Mediastinum/Nodes: Mildly prominent left hilar lymph nodes are grossly stable. There are no enlarged mediastinal or hilar lymph nodes. Previous thyroidectomy. The trachea and esophagus demonstrate no significant findings. Lungs/Pleura: No pleural effusion or pneumothorax. Mild dependent atelectasis at both lung bases. Moderate centrilobular and paraseptal emphysema. Known hypermetabolic left lung nodules are again noted with slight enlargement of the dominant perihilar lingular component measuring  up to 2.2 cm on image 41/7 (previously 2.0 cm). Additional nodules are present the left lung, including a 9 mm left upper lobe nodule on image 24/7 and a spiculated left upper lobe nodule measuring 10 mm on image 26/7. No suspicious right lung nodules. Musculoskeletal/Chest wall: Inferior endplate lucency and surrounding sclerosis again noted within the T10 vertebral body. Although hypermetabolic on prior PET-CT, the appearance is most typical of a Schmorl's node. No acute osseous findings. CT ABDOMEN AND PELVIS FINDINGS Hepatobiliary: The liver is normal in density without suspicious focal abnormality. Probable focal fat adjacent to the falciform ligament. Stable chronic mild intrahepatic and extrahepatic biliary dilatation post cholecystectomy. 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 No evidence of pancreatic mass or surrounding inflammation. Spleen: Normal in size without focal abnormality. Adrenals/Urinary Tract: Both adrenal glands appear normal. Numerous bilateral renal calculi and low-density renal lesions are again noted, similar to previous study. There is bilateral renal cortical thinning and absent contrast excretion consistent with chronic renal failure. No hydronephrosis. No significant bladder findings. 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 No small bowel abnormalities are identified status post right hemicolectomy and anastomosis. There are diverticular changes in the descending and sigmoid colon with possible mild sigmoid colon wall thickening, but no surrounding inflammation or  significant distension. Vascular/Lymphatic: There are no enlarged abdominal or pelvic lymph nodes. Moderate aortic and branch vessel atherosclerosis without acute vascular findings. There is mild reflux of contrast into the IVC and hepatic veins. The portal, superior mesenteric and splenic veins are patent.  Reproductive: Hysterectomy. No adnexal mass. Stable curvilinear calcification between the vagina and rectum. Other: Intact abdominal wall. No ascites or peritoneal nodularity. Stable calcification in the left lower quadrant anterior to the sigmoid colon. Musculoskeletal: No acute or significant osseous findings. Review of the MIP images confirms the above findings. IMPRESSION: 1. No evidence of acute pulmonary embolism or other acute chest process. 2. Known hypermetabolic left lung nodules consistent with metastatic disease. The dominant perihilar lingular component has mildly enlarged. 3. No acute findings in the abdomen or pelvis. 4. Bilateral nephrolithiasis and renal cortical thinning consistent with chronic renal failure. 5. Aortic Atherosclerosis (ICD10-I70.0) and Emphysema (ICD10-J43.9). Electronically Signed   By: Richardean Sale M.D.   On: 11/18/2019 13:16   DG Chest Port 1 View  Result Date: 11/18/2019 CLINICAL DATA:  Former smoker with chest pain. History of emphysema. Hypertension. EXAM: PORTABLE CHEST 1 VIEW COMPARISON:  CT chest 10/18/2019 FINDINGS: Normal heart size. Pulmonary vascular congestion. No pleural effusion or overt edema. No acute airspace consolidation. Left mid lung nodule corresponding to FDG avid 2.3 cm left upper lobe lung nodule seen on previous CT of the chest is unchanged and remains worrisome for primary bronchogenic carcinoma. The lungs appear hyperinflated and there are diffuse interstitial changes compatible with emphysema. IMPRESSION: 1. Pulmonary vascular congestion without overt edema or pleural effusion. 2. Left upper lobe lung nodule is unchanged and remains worrisome for primary bronchogenic carcinoma. 3.  Emphysema (ICD10-J43.9). Electronically Signed   By: Kerby Moors M.D.   On: 11/18/2019 11:17    Cardiac Studies   Echo 10/29/2018 1. The left ventricle has hyperdynamic systolic function, with an  ejection fraction of >65%. The cavity size was normal. Left  ventricular  diastolic Doppler parameters are consistent with impaired relaxation.  2. The right ventricle has normal systolic function. The cavity was  normal. There is no increase in right ventricular wall thickness. Right  ventricular systolic pressure is mildly elevated with an estimated  pressure of 44.3 mmHg.  3. The aortic valve is tricuspid. Mild thickening of the aortic valve.  Moderate calcification of the aortic valve.   Patient Profile     74 y.o. female with a hx of recently diagnosed SVT, chronic diastolic CHF, ESRD on chronic hemodialysis, chronic obstructive lung disease, left upper lobe nodule (2.3 cm) suspicious for bronchogenic neoplasm, history of Crohn's disease, GERD, admitted with abdominal pain, hypotension and a second episode of SVT  Assessment & Plan    1. AVNRT: Adenosine responsive.  Discussed the Valsalva maneuver and/or diving reflex for arrhythmia termination.  Hard to call this a beta-blocker failure since she only took 2 doses of the medication.  Nevertheless, beta-blockers may not be the best choice for arrhythmia prevention since she also has reactive airway disease and takes inhaled bronchodilators frequently.  Will try to see if she tolerates diltiazem better as far as her blood pressure is concerned and see if it works better for arrhythmia prevention.  Short acting diltiazem may be easier to manage with HD associated hypotension.  Unfortunately she is not a good candidate for digoxin due to her renal dysfunction.  Amiodarone could be considered.  Ideally, she could undergo a radiofrequency ablation procedure, but this should not be undertaken lightly since  the risk of AV block and needing a pacemaker would have much more serious implications in a patient with ESRD.  In addition, need to take into consideration the possible limitation in prognosis connected to her lung mass. 2. COPD: On chronic inhaled bronchodilators which increase the likelihood of  arrhythmia. Calcium channel blocker preferred over beta blocker. 3. CHF: No evidence of hypervolemia or heart failure decompensation at this time. 4. ESRD: Dr. Moshe Cipro increased her midodrine to keep her blood pressure high enough for dialysis.  Unfortunately, all usual AV nodal blocking agents will make this worse. 5. Left lung mass: Work-up in progress.  PET scans suggest that this might be an isolated primary bronchogenic carcinoma. 6. Abn troponin: marginal increase is readily explained by tachycardia event, not an acute coronary event.     For questions or updates, please contact [place] Please consult [email or web address] for contact info under        Signed, Sanda Klein, MD  11/19/2019, 12:00 PM

## 2019-11-20 DIAGNOSIS — Z86711 Personal history of pulmonary embolism: Secondary | ICD-10-CM | POA: Diagnosis not present

## 2019-11-20 DIAGNOSIS — I120 Hypertensive chronic kidney disease with stage 5 chronic kidney disease or end stage renal disease: Secondary | ICD-10-CM | POA: Diagnosis present

## 2019-11-20 DIAGNOSIS — Z87442 Personal history of urinary calculi: Secondary | ICD-10-CM | POA: Diagnosis not present

## 2019-11-20 DIAGNOSIS — C771 Secondary and unspecified malignant neoplasm of intrathoracic lymph nodes: Secondary | ICD-10-CM | POA: Diagnosis present

## 2019-11-20 DIAGNOSIS — J9611 Chronic respiratory failure with hypoxia: Secondary | ICD-10-CM | POA: Diagnosis present

## 2019-11-20 DIAGNOSIS — E162 Hypoglycemia, unspecified: Secondary | ICD-10-CM | POA: Diagnosis present

## 2019-11-20 DIAGNOSIS — J439 Emphysema, unspecified: Secondary | ICD-10-CM | POA: Diagnosis present

## 2019-11-20 DIAGNOSIS — K5080 Crohn's disease of both small and large intestine without complications: Secondary | ICD-10-CM | POA: Diagnosis present

## 2019-11-20 DIAGNOSIS — C7951 Secondary malignant neoplasm of bone: Secondary | ICD-10-CM | POA: Diagnosis present

## 2019-11-20 DIAGNOSIS — D696 Thrombocytopenia, unspecified: Secondary | ICD-10-CM | POA: Diagnosis present

## 2019-11-20 DIAGNOSIS — Z20822 Contact with and (suspected) exposure to covid-19: Secondary | ICD-10-CM | POA: Diagnosis present

## 2019-11-20 DIAGNOSIS — N186 End stage renal disease: Secondary | ICD-10-CM | POA: Diagnosis present

## 2019-11-20 DIAGNOSIS — D539 Nutritional anemia, unspecified: Secondary | ICD-10-CM | POA: Diagnosis present

## 2019-11-20 DIAGNOSIS — E876 Hypokalemia: Secondary | ICD-10-CM | POA: Diagnosis present

## 2019-11-20 DIAGNOSIS — C3412 Malignant neoplasm of upper lobe, left bronchus or lung: Secondary | ICD-10-CM | POA: Diagnosis present

## 2019-11-20 DIAGNOSIS — I471 Supraventricular tachycardia: Secondary | ICD-10-CM | POA: Diagnosis present

## 2019-11-20 DIAGNOSIS — I351 Nonrheumatic aortic (valve) insufficiency: Secondary | ICD-10-CM | POA: Diagnosis present

## 2019-11-20 DIAGNOSIS — R778 Other specified abnormalities of plasma proteins: Secondary | ICD-10-CM

## 2019-11-20 DIAGNOSIS — M1990 Unspecified osteoarthritis, unspecified site: Secondary | ICD-10-CM | POA: Diagnosis present

## 2019-11-20 DIAGNOSIS — Z8616 Personal history of COVID-19: Secondary | ICD-10-CM | POA: Diagnosis not present

## 2019-11-20 DIAGNOSIS — Z992 Dependence on renal dialysis: Secondary | ICD-10-CM | POA: Diagnosis not present

## 2019-11-20 DIAGNOSIS — I959 Hypotension, unspecified: Secondary | ICD-10-CM | POA: Diagnosis present

## 2019-11-20 DIAGNOSIS — D631 Anemia in chronic kidney disease: Secondary | ICD-10-CM | POA: Diagnosis present

## 2019-11-20 DIAGNOSIS — I5032 Chronic diastolic (congestive) heart failure: Secondary | ICD-10-CM | POA: Diagnosis not present

## 2019-11-20 DIAGNOSIS — R1013 Epigastric pain: Secondary | ICD-10-CM | POA: Diagnosis present

## 2019-11-20 DIAGNOSIS — E890 Postprocedural hypothyroidism: Secondary | ICD-10-CM | POA: Diagnosis present

## 2019-11-20 LAB — GLUCOSE, CAPILLARY
Glucose-Capillary: 109 mg/dL — ABNORMAL HIGH (ref 70–99)
Glucose-Capillary: 118 mg/dL — ABNORMAL HIGH (ref 70–99)
Glucose-Capillary: 128 mg/dL — ABNORMAL HIGH (ref 70–99)
Glucose-Capillary: 93 mg/dL (ref 70–99)

## 2019-11-20 LAB — HEPATITIS B SURFACE ANTIBODY, QUANTITATIVE: Hep B S AB Quant (Post): >1000 m[IU]/mL (ref >9.9)

## 2019-11-20 MED ORDER — PREDNISONE 10 MG PO TABS
ORAL_TABLET | ORAL | 0 refills | Status: DC
Start: 2019-11-20 — End: 2019-12-07

## 2019-11-20 MED ORDER — DILTIAZEM HCL 30 MG PO TABS: 30.0000 mg | ORAL_TABLET | Freq: Three times a day (TID) | ORAL | 0 refills | Status: DC

## 2019-11-20 MED ORDER — MIDODRINE HCL 10 MG PO TABS: 10.0000 mg | ORAL_TABLET | Freq: Three times a day (TID) | ORAL | 0 refills | Status: DC

## 2019-11-20 MED ORDER — CHLORHEXIDINE GLUCONATE CLOTH 2 % EX PADS: 6.0000 | MEDICATED_PAD | Freq: Every day | CUTANEOUS | Status: DC

## 2019-11-20 NOTE — Progress Notes (Signed)
Discharge instructions given to patient she verbalized understanding of instructions given. PIV removed without complications cath intact. Tele removed CCMD informed. Patient requested go out ER entrance for discharge pickup via family.

## 2019-11-20 NOTE — Progress Notes (Signed)
[facility] Gastroenterology Progress Note  Natasha [name] 74 y.o. [birth year]  CC: Abdominal pain, Crohn's disease   Subjective: Patient seen and examined at bedside.  Feeling much better today.  Abdominal pain has resolved.  Currently having regular bowel movements.  Denies any diarrhea or blood in the stool.  ROS : Afebrile.  Negative for acute chest pain   Objective: Vital signs in last 24 hours: Vitals:   11/20/19 0723 11/20/19 0749  BP: 105/60 124/67  Pulse: 64 70  Resp: 20 20  Temp: 98.3 F (36.8 C) 97.8 F (36.6 C)  SpO2: 100% 100%    Physical Exam:  General:  Alert, cooperative, no distress, appears stated age  Head:  Normocephalic, without obvious abnormality, atraumatic  Eyes:  , EOM's intact,   Lungs:    No respiratory distress noted  Heart:  Regular rate and rhythm, S1, S2 normal  Abdomen:   Soft, non-tender, nondistended, bowel sounds present.  No peritoneal signs  Extremities: Extremities normal, atraumatic, no  edema       Lab Results: Recent Labs    11/18/19 1119 11/19/19 0708  NA 138 135  K 3.2* 4.1  CL 105 102  CO2 20* 20*  GLUCOSE 194* 74  BUN 36* 41*  CREATININE 7.98* 9.20*  CALCIUM 8.3* 8.5*  PHOS  --  7.0*   Recent Labs    11/18/19 1119 11/19/19 0708  AST 18  --   ALT 11  --   ALKPHOS 60  --   BILITOT 0.2*  --   PROT 5.6*  --   ALBUMIN 2.6* 2.5*   Recent Labs    11/18/19 1119 11/19/19 0652  WBC 4.2 5.6  HGB 10.6* 10.4*  HCT 36.5 35.8*  MCV 108.6* 106.5*  PLT 102* 111*   No results for input(s): LABPROT, INR in the last 72 hours.    Assessment/Plan: -Right-sided abdominal pain and diarrhea.  History of Crohn's ileocolitis status post right hemicolectomy.  Diarrhea resolved now.  GI pathogen panel negative.  CT scan showed mild sigmoid colon wall thickening without any inflammation or distention as well as no small bowel abnormalities.  -Chronic perianal fistula.  -Stage IV lung cancer -End-stage renal disease on  dialysis  Recommendations ------------------------ -Abdominal pain significantly improved.  Diarrhea has resolved now. -Continue Solu-Medrol 20 mg IV every 12 hours while in the hospital -Change to p.o. prednisone 20 mg once a day for 1 week followed by prednisone 10 mg for another week upon discharge. -Continue Humira as per her outpatient schedule -No further inpatient GI work-up planned.  GI will sign off.  Call us back if needed  Otis Brace MD, [place] 11/20/2019, 9:00 AM  Contact #  [phone]

## 2019-11-20 NOTE — Plan of Care (Signed)

## 2019-11-20 NOTE — Progress Notes (Signed)
Progress Note  Patient Name: Natasha [name] Date of Encounter: 11/20/2019  [facility] Cardiologist: Mertie Moores, MD   Subjective   Abdominal pain has improved.  Has received higher dose steroids.  No arrhythmia.  Blood pressure is holding well.  Inpatient Medications    Scheduled Meds: . apixaban  2.5 mg Oral BID  . [START ON 11/21/2019] calcitRIOL  0.25 mcg Oral Q M,W,F-HD  . calcium acetate  667 mg Oral TID WC  . Chlorhexidine Gluconate Cloth  6 each Topical Q0600  . diltiazem  30 mg Oral Q8H  . fluticasone  1-2 spray Each Nare Daily  . fluticasone furoate-vilanterol  1 puff Inhalation Daily  . levothyroxine  125 mcg Oral QAC breakfast  . [START ON 11/21/2019] lidocaine-prilocaine  1 application Topical Q M,W,F-HD  . mouth rinse  15 mL Mouth Rinse BID  . methylPREDNISolone (SOLU-MEDROL) injection  20 mg Intravenous Q12H  . midodrine  10 mg Oral TID WC  . pantoprazole (PROTONIX) IV  40 mg Intravenous Q12H  . umeclidinium bromide  1 puff Inhalation Daily   Continuous Infusions: . dextrose 50 mL/hr at 11/20/19 0428   PRN Meds: acetaminophen **OR** acetaminophen, alum & mag hydroxide-simeth, loperamide, ondansetron **OR** ondansetron (ZOFRAN) IV, oxyCODONE, traMADol   Vital Signs    Vitals:   11/20/19 0026 11/20/19 0415 11/20/19 0723 11/20/19 0749  BP: (!) 97/51 (!) 100/52 105/60 124/67  Pulse: 77 63 64 70  Resp: 20 20 20 20   Temp: 98 F (36.7 C) 97.8 F (36.6 C) 98.3 F (36.8 C) 97.8 F (36.6 C)  TempSrc: Oral Oral Oral Oral  SpO2: 100% 100% 100% 100%  Weight: 52.2 kg     Height:        Intake/Output Summary (Last 24 hours) at 11/20/2019 1145 Last data filed at 11/20/2019 0417 Gross per 24 hour  Intake 1564.94 ml  Output 1000 ml  Net 564.94 ml   Last 3 Weights 11/20/2019 11/19/2019 11/19/2019  Weight (lbs) 115 lb 114 lb 6.7 oz 115 lb 15.4 oz  Weight (kg) 52.164 kg 51.9 kg 52.6 kg      Telemetry    Sinus rhythm- Personally Reviewed  ECG    No  new tracing- Personally Reviewed  Physical Exam  Very slender, no distress GEN: No acute distress.   Neck: No JVD Cardiac: RRR, X aortic ejection murmur is early peaking, no diastolic murmurs, rubs, or gallops.  Left upper extremity AV fistula with excellent thrill and bruit Respiratory: Clear to auscultation bilaterally. GI: Soft, nontender, non-distended  MS: No edema; No deformity. Neuro:  Nonfocal  Psych: Normal affect   Labs    High Sensitivity Troponin:   Recent Labs  Lab 11/18/19 1119 11/18/19 1321 11/19/19 0708  TROPONINIHS 46* 95* 111*      Chemistry Recent Labs  Lab 11/15/19 1317 11/18/19 1119 11/19/19 0708  NA 140 138 135  K 3.0* 3.2* 4.1  CL 103 105 102  CO2 24 20* 20*  GLUCOSE 75 194* 74  BUN 23 36* 41*  CREATININE 6.57* 7.98* 9.20*  CALCIUM 8.7* 8.3* 8.5*  PROT 6.8 5.6*  --   ALBUMIN 3.1* 2.6* 2.5*  AST 16 18  --   ALT 10 11  --   ALKPHOS 82 60  --   BILITOT 0.6 0.2*  --   GFRNONAA 6* 5* 4*  GFRAA 7* 5* 4*  ANIONGAP 13 13 13      Hematology Recent Labs  Lab 11/15/19 1317 11/18/19 1119 11/19/19  0652  WBC 5.1 4.2 5.6  RBC 3.63* 3.36* 3.36*  HGB 11.6* 10.6* 10.4*  HCT 38.3 36.5 35.8*  MCV 105.5* 108.6* 106.5*  MCH 32.0 31.5 31.0  MCHC 30.3 29.0* 29.1*  RDW 14.7 14.6 14.6  PLT 112* 102* 111*    BNPNo results for input(s): BNP, PROBNP in the last 168 hours.   DDimer No results for input(s): DDIMER in the last 168 hours.   Radiology    CT Angio Chest PE W and/or Wo Contrast  Result Date: 11/18/2019 CLINICAL DATA:  Shortness of breath with nausea, dizziness and abdominal pain. Atrial fibrillation with rapid ventricular response. Clinical concern for acute pulmonary embolism. Chronic kidney disease on hemodialysis. EXAM: CT ANGIOGRAPHY CHEST CT ABDOMEN AND PELVIS WITH CONTRAST TECHNIQUE: Multidetector CT imaging of the chest was performed using the standard protocol during bolus administration of intravenous contrast. Multiplanar CT image  reconstructions and MIPs were obtained to evaluate the vascular anatomy. Multidetector CT imaging of the abdomen and pelvis was performed using the standard protocol during bolus administration of intravenous contrast. CONTRAST:  133m OMNIPAQUE IOHEXOL 350 MG/ML SOLN COMPARISON:  PET-CT 11/01/2019. Chest CT 10/18/2019 and abdominopelvic CT 09/20/2019. FINDINGS: CTA CHEST FINDINGS Cardiovascular: The pulmonary arteries are well opacified with contrast to the level of the subsegmental branches. There is no evidence of acute pulmonary embolism. Stable mild central enlargement of the pulmonary arteries and mild atherosclerosis of the aorta, great vessels and coronary arteries. Left axillary and brachial stent noted. The heart size is normal. There is no pericardial effusion. Mediastinum/Nodes: Mildly prominent left hilar lymph nodes are grossly stable. There are no enlarged mediastinal or hilar lymph nodes. Previous thyroidectomy. The trachea and esophagus demonstrate no significant findings. Lungs/Pleura: No pleural effusion or pneumothorax. Mild dependent atelectasis at both lung bases. Moderate centrilobular and paraseptal emphysema. Known hypermetabolic left lung nodules are again noted with slight enlargement of the dominant perihilar lingular component measuring up to 2.2 cm on image 41/7 (previously 2.0 cm). Additional nodules are present the left lung, including a 9 mm left upper lobe nodule on image 24/7 and a spiculated left upper lobe nodule measuring 10 mm on image 26/7. No suspicious right lung nodules. Musculoskeletal/Chest wall: Inferior endplate lucency and surrounding sclerosis again noted within the T10 vertebral body. Although hypermetabolic on prior PET-CT, the appearance is most typical of a Schmorl's node. No acute osseous findings. CT ABDOMEN AND PELVIS FINDINGS Hepatobiliary: The liver is normal in density without suspicious focal abnormality. Probable focal fat adjacent to the falciform  ligament. Stable chronic mild intrahepatic and extrahepatic biliary dilatation post cholecystectomy. 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 No evidence of pancreatic mass or surrounding inflammation. Spleen: Normal in size without focal abnormality. Adrenals/Urinary Tract: Both adrenal glands appear normal. Numerous bilateral renal calculi and low-density renal lesions are again noted, similar to previous study. There is bilateral renal cortical thinning and absent contrast excretion consistent with chronic renal failure. No hydronephrosis. No significant bladder findings. 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 No small bowel abnormalities are identified status post right hemicolectomy and anastomosis. There are diverticular changes in the descending and sigmoid colon with possible mild sigmoid colon wall thickening, but no surrounding inflammation or significant distension. Vascular/Lymphatic: There are no enlarged abdominal or pelvic lymph nodes. Moderate aortic and branch vessel atherosclerosis without acute vascular findings. There is mild reflux of contrast into the IVC and hepatic veins. The portal, superior mesenteric and splenic veins  are patent. Reproductive: Hysterectomy. No adnexal mass. Stable curvilinear calcification between the vagina and rectum. Other: Intact abdominal wall. No ascites or peritoneal nodularity. Stable calcification in the left lower quadrant anterior to the sigmoid colon. Musculoskeletal: No acute or significant osseous findings. Review of the MIP images confirms the above findings. IMPRESSION: 1. No evidence of acute pulmonary embolism or other acute chest process. 2. Known hypermetabolic left lung nodules consistent with metastatic disease. The dominant perihilar lingular component has mildly enlarged. 3. No acute findings in the abdomen or pelvis. 4. Bilateral  nephrolithiasis and renal cortical thinning consistent with chronic renal failure. 5. Aortic Atherosclerosis (ICD10-I70.0) and Emphysema (ICD10-J43.9). Electronically Signed   By: Richardean Sale M.D.   On: 11/18/2019 13:16   CT ABDOMEN PELVIS W CONTRAST  Result Date: 11/18/2019 CLINICAL DATA:  Shortness of breath with nausea, dizziness and abdominal pain. Atrial fibrillation with rapid ventricular response. Clinical concern for acute pulmonary embolism. Chronic kidney disease on hemodialysis. EXAM: CT ANGIOGRAPHY CHEST CT ABDOMEN AND PELVIS WITH CONTRAST TECHNIQUE: Multidetector CT imaging of the chest was performed using the standard protocol during bolus administration of intravenous contrast. Multiplanar CT image reconstructions and MIPs were obtained to evaluate the vascular anatomy. Multidetector CT imaging of the abdomen and pelvis was performed using the standard protocol during bolus administration of intravenous contrast. CONTRAST:  135m OMNIPAQUE IOHEXOL 350 MG/ML SOLN COMPARISON:  PET-CT 11/01/2019. Chest CT 10/18/2019 and abdominopelvic CT 09/20/2019. FINDINGS: CTA CHEST FINDINGS Cardiovascular: The pulmonary arteries are well opacified with contrast to the level of the subsegmental branches. There is no evidence of acute pulmonary embolism. Stable mild central enlargement of the pulmonary arteries and mild atherosclerosis of the aorta, great vessels and coronary arteries. Left axillary and brachial stent noted. The heart size is normal. There is no pericardial effusion. Mediastinum/Nodes: Mildly prominent left hilar lymph nodes are grossly stable. There are no enlarged mediastinal or hilar lymph nodes. Previous thyroidectomy. The trachea and esophagus demonstrate no significant findings. Lungs/Pleura: No pleural effusion or pneumothorax. Mild dependent atelectasis at both lung bases. Moderate centrilobular and paraseptal emphysema. Known hypermetabolic left lung nodules are again noted with slight  enlargement of the dominant perihilar lingular component measuring up to 2.2 cm on image 41/7 (previously 2.0 cm). Additional nodules are present the left lung, including a 9 mm left upper lobe nodule on image 24/7 and a spiculated left upper lobe nodule measuring 10 mm on image 26/7. No suspicious right lung nodules. Musculoskeletal/Chest wall: Inferior endplate lucency and surrounding sclerosis again noted within the T10 vertebral body. Although hypermetabolic on prior PET-CT, the appearance is most typical of a Schmorl's node. No acute osseous findings. CT ABDOMEN AND PELVIS FINDINGS Hepatobiliary: The liver is normal in density without suspicious focal abnormality. Probable focal fat adjacent to the falciform ligament. Stable chronic mild intrahepatic and extrahepatic biliary dilatation post cholecystectomy. 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 No evidence of pancreatic mass or surrounding inflammation. Spleen: Normal in size without focal abnormality. Adrenals/Urinary Tract: Both adrenal glands appear normal. Numerous bilateral renal calculi and low-density renal lesions are again noted, similar to previous study. There is bilateral renal cortical thinning and absent contrast excretion consistent with chronic renal failure. No hydronephrosis. No significant bladder findings. 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 No small bowel abnormalities are identified status post right hemicolectomy and anastomosis. There are diverticular changes in the descending and sigmoid colon with possible mild sigmoid colon wall thickening, but  no surrounding inflammation or significant distension. Vascular/Lymphatic: There are no enlarged abdominal or pelvic lymph nodes. Moderate aortic and branch vessel atherosclerosis without acute vascular findings. There is mild reflux of contrast into the IVC and hepatic veins. The  portal, superior mesenteric and splenic veins are patent. Reproductive: Hysterectomy. No adnexal mass. Stable curvilinear calcification between the vagina and rectum. Other: Intact abdominal wall. No ascites or peritoneal nodularity. Stable calcification in the left lower quadrant anterior to the sigmoid colon. Musculoskeletal: No acute or significant osseous findings. Review of the MIP images confirms the above findings. IMPRESSION: 1. No evidence of acute pulmonary embolism or other acute chest process. 2. Known hypermetabolic left lung nodules consistent with metastatic disease. The dominant perihilar lingular component has mildly enlarged. 3. No acute findings in the abdomen or pelvis. 4. Bilateral nephrolithiasis and renal cortical thinning consistent with chronic renal failure. 5. Aortic Atherosclerosis (ICD10-I70.0) and Emphysema (ICD10-J43.9). Electronically Signed   By: Richardean Sale M.D.   On: 11/18/2019 13:16    Cardiac Studies   Echo 10/29/2018 1. The left ventricle has hyperdynamic systolic function, with an  ejection fraction of >65%. The cavity size was normal. Left ventricular  diastolic Doppler parameters are consistent with impaired relaxation.  2. The right ventricle has normal systolic function. The cavity was  normal. There is no increase in right ventricular wall thickness. Right  ventricular systolic pressure is mildly elevated with an estimated  pressure of 44.3 mmHg.  3. The aortic valve is tricuspid. Mild thickening of the aortic valve.  Moderate calcification of the aortic valve.   Patient Profile     74 y.o. female with a hx of recently diagnosed SVT, chronic diastolic CHF, ESRD on chronic hemodialysis, chronic obstructive lung disease, left upper lobe nodule(2.3 cm)suspicious for bronchogenic neoplasm, history of Crohn's disease, GERD, admitted with abdominal pain, hypotension and a second episode of SVT  Assessment & Plan    1. AVNRT:Adenosine responsive.   Unusually long PR interval suggest possible atypical (slow slow) AV nodal pathway. Discussed the Valsalva maneuver and/or diving reflex for arrhythmia termination.  Seems to be tolerating diltiazem well.  Ideally, she could undergo a radiofrequency ablation procedure, but this should not be undertaken lightly since the risk of AV block and needing a pacemaker would have much more serious implications in a patient with ESRD. In addition, need to take into consideration the possible limitation in prognosis connected to her lung mass. 2. COPD:On chronic inhaled bronchodilators which increase the likelihood of arrhythmia. Calcium channel blocker preferred over beta blocker. 3. CHF:No evidence of hypervolemia or heart failure decompensation at this time. 4. ESRD:Dr. Moshe Cipro increased her midodrine to keep her blood pressure high enough for dialysis. Unfortunately, all usual AV nodal blocking agents will make this worse. 5. Left lung mass:Work-up in progress. PET scans suggest that this might be an isolated primary bronchogenic carcinoma. 6. Abn troponin: marginal increase is readily explained by tachycardia event, not an acute coronary event. [facility] will sign off.   Medication Recommendations: Diltiazem 30 mg p.o. 3 times daily.  The dose of diltiazem can be adjusted on dialysis days if necessary for hypotension.  Stop metoprolol. Other recommendations (labs, testing, etc):  n/a Follow up as an outpatient: We will arrange follow-up in about a month at the [facility] office.  For questions or updates, please contact [place] Please consult [email or web address] for contact info under        Signed, Sanda Klein, MD  11/20/2019, 11:45 AM

## 2019-11-20 NOTE — Progress Notes (Signed)
Subjective:  Had HD - removed a liter-  GI saw and started her on steroids- she feels better today    Objective Vital signs in last 24 hours: Vitals:   11/20/19 0026 11/20/19 0415 11/20/19 0723 11/20/19 0749  BP: (!) 97/51 (!) 100/52 105/60 124/67  Pulse: 77 63 64 70  Resp: 20 20 20 20   Temp: 98 F (36.7 C) 97.8 F (36.6 C) 98.3 F (36.8 C) 97.8 F (36.6 C)  TempSrc: Oral Oral Oral Oral  SpO2: 100% 100% 100% 100%  Weight: 52.2 kg     Height:       Weight change: 1.888 kg  Intake/Output Summary (Last 24 hours) at 11/20/2019 0758 Last data filed at 11/20/2019 0417 Gross per 24 hour  Intake 1804.94 ml  Output 1000 ml  Net 804.94 ml   Dialyzes at [place]-  MWF  EDW 50.5. profile #3 [facility] [place] 3/2.25, Dialyzer 180, Heparin yess 4200. Access left AVG.hect 4-- last hgb 11.3- calc 8.8 phos 7.8  Assessment/Plan: 74 year old BF- with ESRD, COPD and Crohns-  hosp with abdominal pain-  Low BP and atrial arrythmias of late 1 abdominal pain-  Work up per primary team-  CT is unremarkable-  I was concerned about the possibility of ischemic bowel in the setting of low BP- initial lactate 1.6- but later 0.8. She indicates that she has had this pain before felt to be associated with crohns and treated with steroids-  Unsure if this is worth doing- GI saw-  Started her on steroids- pain is better 2 ESRD: normally MWF via AVG.   Planning on HD next Monday to keep on schedule- gentle to no volume off-  Will do HD here if is here but if is discharged can go to her OP unit  3 Hypertension: BP is low-  Difficult b/c any med to treat this atrial arrythmia issue will lower BP.  On usual midodrine-  have increased to max dose  4. 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  COPD and Stage iv lung cancer-  Complicating issues      Louis Meckel    Labs: Basic Metabolic Panel: Recent Labs  Lab 11/15/19 1317  11/18/19 1119 11/19/19 0708  NA 140 138 135  K 3.0* 3.2* 4.1  CL 103 105 102  CO2 24 20* 20*  GLUCOSE 75 194* 74  BUN 23 36* 41*  CREATININE 6.57* 7.98* 9.20*  CALCIUM 8.7* 8.3* 8.5*  PHOS  --   --  7.0*   Liver Function Tests: Recent Labs  Lab 11/15/19 1317 11/18/19 1119 11/19/19 0708  AST 16 18  --   ALT 10 11  --   ALKPHOS 82 60  --   BILITOT 0.6 0.2*  --   PROT 6.8 5.6*  --   ALBUMIN 3.1* 2.6* 2.5*   Recent Labs  Lab 11/18/19 1119  LIPASE 56*   No results for input(s): AMMONIA in the last 168 hours. CBC: Recent Labs  Lab 11/15/19 1317 11/18/19 1119 11/19/19 0652  WBC 5.1 4.2 5.6  NEUTROABS 1.9  --   --   HGB 11.6* 10.6* 10.4*  HCT 38.3 36.5 35.8*  MCV 105.5* 108.6* 106.5*  PLT 112* 102* 111*   Cardiac Enzymes: No results for input(s): CKTOTAL, CKMB, CKMBINDEX, TROPONINI in the last 168 hours. CBG: Recent Labs  Lab 11/19/19 1158 11/19/19 2036 11/20/19  0023 11/20/19 0414 11/20/19 0720  GLUCAP 101* 201* 128* 118* 109*    Iron Studies: No results for input(s): IRON, TIBC, TRANSFERRIN, FERRITIN in the last 72 hours. Studies/Results: CT Angio Chest PE W and/or Wo Contrast  Result Date: 11/18/2019 CLINICAL DATA:  Shortness of breath with nausea, dizziness and abdominal pain. Atrial fibrillation with rapid ventricular response. Clinical concern for acute pulmonary embolism. Chronic kidney disease on hemodialysis. EXAM: CT ANGIOGRAPHY CHEST CT ABDOMEN AND PELVIS WITH CONTRAST TECHNIQUE: Multidetector CT imaging of the chest was performed using the standard protocol during bolus administration of intravenous contrast. Multiplanar CT image reconstructions and MIPs were obtained to evaluate the vascular anatomy. Multidetector CT imaging of the abdomen and pelvis was performed using the standard protocol during bolus administration of intravenous contrast. CONTRAST:  14m OMNIPAQUE IOHEXOL 350 MG/ML SOLN COMPARISON:  PET-CT 11/01/2019. Chest CT 10/18/2019 and  abdominopelvic CT 09/20/2019. FINDINGS: CTA CHEST FINDINGS Cardiovascular: The pulmonary arteries are well opacified with contrast to the level of the subsegmental branches. There is no evidence of acute pulmonary embolism. Stable mild central enlargement of the pulmonary arteries and mild atherosclerosis of the aorta, great vessels and coronary arteries. Left axillary and brachial stent noted. The heart size is normal. There is no pericardial effusion. Mediastinum/Nodes: Mildly prominent left hilar lymph nodes are grossly stable. There are no enlarged mediastinal or hilar lymph nodes. Previous thyroidectomy. The trachea and esophagus demonstrate no significant findings. Lungs/Pleura: No pleural effusion or pneumothorax. Mild dependent atelectasis at both lung bases. Moderate centrilobular and paraseptal emphysema. Known hypermetabolic left lung nodules are again noted with slight enlargement of the dominant perihilar lingular component measuring up to 2.2 cm on image 41/7 (previously 2.0 cm). Additional nodules are present the left lung, including a 9 mm left upper lobe nodule on image 24/7 and a spiculated left upper lobe nodule measuring 10 mm on image 26/7. No suspicious right lung nodules. Musculoskeletal/Chest wall: Inferior endplate lucency and surrounding sclerosis again noted within the T10 vertebral body. Although hypermetabolic on prior PET-CT, the appearance is most typical of a Schmorl's node. No acute osseous findings. CT ABDOMEN AND PELVIS FINDINGS Hepatobiliary: The liver is normal in density without suspicious focal abnormality. Probable focal fat adjacent to the falciform ligament. Stable chronic mild intrahepatic and extrahepatic biliary dilatation post cholecystectomy. 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 No evidence of pancreatic mass or surrounding inflammation. Spleen: Normal in size without focal abnormality. Adrenals/Urinary Tract: Both  adrenal glands appear normal. Numerous bilateral renal calculi and low-density renal lesions are again noted, similar to previous study. There is bilateral renal cortical thinning and absent contrast excretion consistent with chronic renal failure. No hydronephrosis. No significant bladder findings. 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 No small bowel abnormalities are identified status post right hemicolectomy and anastomosis. There are diverticular changes in the descending and sigmoid colon with possible mild sigmoid colon wall thickening, but no surrounding inflammation or significant distension. Vascular/Lymphatic: There are no enlarged abdominal or pelvic lymph nodes. Moderate aortic and branch vessel atherosclerosis without acute vascular findings. There is mild reflux of contrast into the IVC and hepatic veins. The portal, superior mesenteric and splenic veins are patent. Reproductive: Hysterectomy. No adnexal mass. Stable curvilinear calcification between the vagina and rectum. Other: Intact abdominal wall. No ascites or peritoneal nodularity. Stable calcification in the left lower quadrant anterior to the sigmoid colon. Musculoskeletal: No acute or significant osseous findings. Review of the MIP images  confirms the above findings. IMPRESSION: 1. No evidence of acute pulmonary embolism or other acute chest process. 2. Known hypermetabolic left lung nodules consistent with metastatic disease. The dominant perihilar lingular component has mildly enlarged. 3. No acute findings in the abdomen or pelvis. 4. Bilateral nephrolithiasis and renal cortical thinning consistent with chronic renal failure. 5. Aortic Atherosclerosis (ICD10-I70.0) and Emphysema (ICD10-J43.9). Electronically Signed   By: Richardean Sale M.D.   On: 11/18/2019 13:16   CT ABDOMEN PELVIS W CONTRAST  Result Date: 11/18/2019 CLINICAL DATA:  Shortness of breath with nausea, dizziness  and abdominal pain. Atrial fibrillation with rapid ventricular response. Clinical concern for acute pulmonary embolism. Chronic kidney disease on hemodialysis. EXAM: CT ANGIOGRAPHY CHEST CT ABDOMEN AND PELVIS WITH CONTRAST TECHNIQUE: Multidetector CT imaging of the chest was performed using the standard protocol during bolus administration of intravenous contrast. Multiplanar CT image reconstructions and MIPs were obtained to evaluate the vascular anatomy. Multidetector CT imaging of the abdomen and pelvis was performed using the standard protocol during bolus administration of intravenous contrast. CONTRAST:  125m OMNIPAQUE IOHEXOL 350 MG/ML SOLN COMPARISON:  PET-CT 11/01/2019. Chest CT 10/18/2019 and abdominopelvic CT 09/20/2019. FINDINGS: CTA CHEST FINDINGS Cardiovascular: The pulmonary arteries are well opacified with contrast to the level of the subsegmental branches. There is no evidence of acute pulmonary embolism. Stable mild central enlargement of the pulmonary arteries and mild atherosclerosis of the aorta, great vessels and coronary arteries. Left axillary and brachial stent noted. The heart size is normal. There is no pericardial effusion. Mediastinum/Nodes: Mildly prominent left hilar lymph nodes are grossly stable. There are no enlarged mediastinal or hilar lymph nodes. Previous thyroidectomy. The trachea and esophagus demonstrate no significant findings. Lungs/Pleura: No pleural effusion or pneumothorax. Mild dependent atelectasis at both lung bases. Moderate centrilobular and paraseptal emphysema. Known hypermetabolic left lung nodules are again noted with slight enlargement of the dominant perihilar lingular component measuring up to 2.2 cm on image 41/7 (previously 2.0 cm). Additional nodules are present the left lung, including a 9 mm left upper lobe nodule on image 24/7 and a spiculated left upper lobe nodule measuring 10 mm on image 26/7. No suspicious right lung nodules. Musculoskeletal/Chest  wall: Inferior endplate lucency and surrounding sclerosis again noted within the T10 vertebral body. Although hypermetabolic on prior PET-CT, the appearance is most typical of a Schmorl's node. No acute osseous findings. CT ABDOMEN AND PELVIS FINDINGS Hepatobiliary: The liver is normal in density without suspicious focal abnormality. Probable focal fat adjacent to the falciform ligament. Stable chronic mild intrahepatic and extrahepatic biliary dilatation post cholecystectomy. 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 No evidence of pancreatic mass or surrounding inflammation. Spleen: Normal in size without focal abnormality. Adrenals/Urinary Tract: Both adrenal glands appear normal. Numerous bilateral renal calculi and low-density renal lesions are again noted, similar to previous study. There is bilateral renal cortical thinning and absent contrast excretion consistent with chronic renal failure. No hydronephrosis. No significant bladder findings. 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 No small bowel abnormalities are identified status post right hemicolectomy and anastomosis. There are diverticular changes in the descending and sigmoid colon with possible mild sigmoid colon wall thickening, but no surrounding inflammation or significant distension. Vascular/Lymphatic: There are no enlarged abdominal or pelvic lymph nodes. Moderate aortic and branch vessel atherosclerosis without acute vascular findings. There is mild reflux of contrast into the IVC and hepatic veins. The portal, superior mesenteric and splenic veins are patent. Reproductive:  Hysterectomy. No adnexal mass. Stable curvilinear calcification between the vagina and rectum. Other: Intact abdominal wall. No ascites or peritoneal nodularity. Stable calcification in the left lower quadrant anterior to the sigmoid colon. Musculoskeletal: No acute or  significant osseous findings. Review of the MIP images confirms the above findings. IMPRESSION: 1. No evidence of acute pulmonary embolism or other acute chest process. 2. Known hypermetabolic left lung nodules consistent with metastatic disease. The dominant perihilar lingular component has mildly enlarged. 3. No acute findings in the abdomen or pelvis. 4. Bilateral nephrolithiasis and renal cortical thinning consistent with chronic renal failure. 5. Aortic Atherosclerosis (ICD10-I70.0) and Emphysema (ICD10-J43.9). Electronically Signed   By: Richardean Sale M.D.   On: 11/18/2019 13:16   DG Chest Port 1 View  Result Date: 11/18/2019 CLINICAL DATA:  Former smoker with chest pain. History of emphysema. Hypertension. EXAM: PORTABLE CHEST 1 VIEW COMPARISON:  CT chest 10/18/2019 FINDINGS: Normal heart size. Pulmonary vascular congestion. No pleural effusion or overt edema. No acute airspace consolidation. Left mid lung nodule corresponding to FDG avid 2.3 cm left upper lobe lung nodule seen on previous CT of the chest is unchanged and remains worrisome for primary bronchogenic carcinoma. The lungs appear hyperinflated and there are diffuse interstitial changes compatible with emphysema. IMPRESSION: 1. Pulmonary vascular congestion without overt edema or pleural effusion. 2. Left upper lobe lung nodule is unchanged and remains worrisome for primary bronchogenic carcinoma. 3.  Emphysema (ICD10-J43.9). Electronically Signed   By: Kerby Moors M.D.   On: 11/18/2019 11:17   Medications: Infusions:  dextrose 50 mL/hr at 11/20/19 0428    Scheduled Medications:  apixaban  2.5 mg Oral BID   [START ON 11/21/2019] calcitRIOL  0.25 mcg Oral Q M,W,F-HD   calcium acetate  667 mg Oral TID WC   Chlorhexidine Gluconate Cloth  6 each Topical Q0600   diltiazem  30 mg Oral Q8H   fluticasone  1-2 spray Each Nare Daily   fluticasone furoate-vilanterol  1 puff Inhalation Daily   levothyroxine  125 mcg Oral QAC  breakfast   [START ON 11/21/2019] lidocaine-prilocaine  1 application Topical Q M,W,F-HD   mouth rinse  15 mL Mouth Rinse BID   methylPREDNISolone (SOLU-MEDROL) injection  20 mg Intravenous Q12H   midodrine  10 mg Oral TID WC   pantoprazole (PROTONIX) IV  40 mg Intravenous Q12H   umeclidinium bromide  1 puff Inhalation Daily    have reviewed scheduled and prn medications.  Physical Exam: General: thin-  Eating breakfast, NAD Heart: RRR Lungs: dec BS Abdomen: non tender today  Extremities: no edema Dialysis Access: AVG-  patent    11/20/2019,7:58 AM  LOS: 0 days

## 2019-11-20 NOTE — Discharge Summary (Signed)
Physician Discharge Summary  Patient ID: Natasha [name] MRN: [id number] DOB/AGE: 74-Dec-1947 73 y.o.  Admit date: 11/18/2019 Discharge date: 11/20/2019  Admission Diagnoses:  Discharge Diagnoses:  Principal Problem:   Epigastric abdominal pain Active Problems:   Crohn's disease without complication (HCC)   Nausea vomiting and diarrhea   Anemia of chronic disease   ESRD on dialysis (HCC)   Hypothyroidism   Emphysema/COPD (HCC)   Chronic respiratory failure with hypoxia (HCC)   Metastatic lung carcinoma, left (HCC)   Arrhythmia   Elevated troponin   Discharged Condition: stable  Hospital Course: Patient is a 74 year old African-American with past medical history significant foranemia of chronic disease, Crohn's disease, ESRD on HD, emphysema/COPD on 3-4 L of oxygen, nephrolithiasis, hypothyroidism,andhistory of PE on chronic anticoagulation.  Patient presented with abdominal pain that is mainly right-sided, nausea, vomiting and diarrhea.  On presentation, patient was hypotensive.  Patient was also noted to have elevated troponin and low blood sugar.  Patient was admitted for further assessment and management.  Cardiology team was consulted to assist with management of tachycardia and positive troponin.  The tachycardia was thought to be secondary to AV nodal reentry tachycardia.  Metoprolol has been changed to Cardizem.  Troponin is thought to be related to the tachycardia.  Patient will follow with cardiology team on discharge.  Cardiology has cleared patient for discharge.  GI team was consulted, and patient's steroid was changed to IV.  Patient will be discharged on prednisone 20 Mg p.o. once daily for a week, then decrease to 10 Mg p.o. once daily for 1 week,and then patient with continue the usual dose of prednisone 5 Mg p.o. once daily.  GI panel came back negative.  CT scan of the the abdomen with contrast did not reveal any significant finding.  Cortisol level done during the  hospital stay was 7 (difficult to interpret in the face of chronic steroid use).  Abdominal pain has resolved.  GI symptoms have resolved.  Hypotension has resolved.  Patient was seen by the nephrology team during the hospital stay.  Patient underwent hemodialysis on 11/19/2019.  Midodrine has also been increased from 5 Mg p.o. 3 times daily to 10 mg p.o. 3 times daily.  Patient will be discharged back home to the care of the primary care provider, nephrology, cardiology, pulmonary and GI teams.  Patient will follow with the above named physicians within 1 week of discharge.  Patient will also follow-up with hematology oncology team discharge as there are concerns for possible metastatic lung cancer.    Right sided abdominal pain: -Patient is not particularly good historian. -Patient endorsed associated nausea, vomiting, diarrhea -GI symptoms have resolved. -CT abdomen was nonrevealing. -Patient has history of chronic disease.  Patient's oral low-dose prednisone was changed to IV steroids during the hospital stay.  Patient will be discharged on prednisone 20 Mg p.o. once daily for 1 week, then 10 Mg p.o. once daily for 1 week and then continue usual prednisone dose of 5 Mg p.o. once daily. -GI team has cleared patient for discharge.  Elevated troponin/SVT: -Cardiology team directed patient's care during the hospital stay.  -Elevated troponin was thought to be secondary to tachycardia.  -Patient has been started on Cardizem for likely AV nodal reentry tachycardia.  Patient will follow up with cardiology team on discharge.    Hypoglycemia:  -In the setting of likely metastatic lung cancer, transient hypotension and abdominal pain, cortisol level was checked.  -Cortisol level was 7.  Interpretation of the  results could be challenging in the setting of chronic steroid use, though, low dose (patient has been on prednisone 5 Mg p.o. once daily).    Transient hypotension:  -See above.   -Hypotension  has resolved.   -Midodrine has been increased from 5 Mg p.o. 3 times daily to 10 mg p.o. 3 times daily.   ESRD on HD:  -Nephrology input is appreciated.   -Patient was dialyzed on 11/19/2019.     Hypokalemia:  -Resolved.    Possible stage IV lung cancer: -Patient being followed in the outpatient setting by oncology forconcerns for stage IV cancerwith left upper lobe nodule, left hilar/suprahilar nodal metastases, and metastatic osseous lesions to T10/T11.PET scan from 6/8 revealed2.3 x 1.9 cm central left upper lobe masswith SUV max of 16.2compatible with primary bronchogenic neoplasm.Patient was thought to be at high risk for complications from bronchoscopy. . -Continue outpatient follow-up with oncologyand pulmonology  Crohn's disease: -Patient on chronic prednisoneand Humira. -Continue prednisone as documented above. -IV steroids was used during the hospital stay.    COPD/emphysema Chronic respiratory failure:  -Stable.   -ContinueTrelegy  -DuoNebs as needed for shortness of breath/wheezing  Macrocytic anemia:  -Vitamin B12 and folate checked previously, and B12 within normal limits.   -Hopefully, there is no component of myelodysplastic syndrome.   -Patient is already under the care of the heme-onc team.   -Hemoglobin is at goal for hemodialysis/ESRD on hemodialysis.    Hypothyroidism -TSH was 1.49 -Continue levothyroxine  Thrombocytopenia: Chronic.  -Continue to monitor  History of COVID-19 virus infection   Consults: cardiology, GI and nephrology  Discharge Exam: Blood pressure 124/67, pulse 70, temperature 97.8 F (36.6 C), temperature source Oral, resp. rate 20, height 5' 4"  (1.626 m), weight 52.2 kg, SpO2 100 %.  Disposition: Discharge disposition: 01-Home or Self Care   Discharge Instructions    Diet - low sodium heart healthy   Complete by: As directed    Increase activity slowly   Complete by: As directed      Allergies as  of 11/20/2019      Reactions   Mercaptopurine Other (See Comments)   Caused pancreatitis   Remicade [infliximab] Other (See Comments)   CAUSED JOINT PAIN      Medication List    STOP taking these medications   metoprolol tartrate 25 MG tablet Commonly known as: LOPRESSOR     TAKE these medications   acetaminophen 500 MG tablet Commonly known as: TYLENOL Take 1,000 mg by mouth every 6 (six) hours as needed for mild pain.   albuterol 108 (90 Base) MCG/ACT inhaler Commonly known as: VENTOLIN HFA INHALE 2 PUFFS BY MOUTH EVERY 6 HOURS IF NEEDED FOR WHEEZING OR SHORTNESS OF BREATH   alendronate 70 MG tablet Commonly known as: FOSAMAX Take 70 mg by mouth every Monday.   calcitRIOL 0.25 MCG capsule Commonly known as: ROCALTROL Take 0.25 mcg by mouth every Monday, Wednesday, and Friday with hemodialysis.   calcium acetate 667 MG capsule Commonly known as: PHOSLO Take 1 capsule (667 mg total) by mouth 3 (three) times daily with meals.   calcium-vitamin D 500-200 MG-UNIT Tabs tablet Commonly known as: OSCAL WITH D Take 1 tablet by mouth as needed (with dialysis).   cyanocobalamin 1000 MCG/ML injection Commonly known as: (VITAMIN B-12) Inject 1,000 mcg into the muscle every 30 (thirty) days.   diltiazem 30 MG tablet Commonly known as: CARDIZEM Take 1 tablet (30 mg total) by mouth every 8 (eight) hours.   Eliquis 5 MG  Tabs tablet Generic drug: apixaban Take 2.5 mg by mouth 2 (two) times daily. Takes 1/2 tablet (2.30m) bid   fluticasone 50 MCG/ACT nasal spray Commonly known as: FLONASE Place 1-2 sprays into both nostrils daily.   heparin sodium (porcine) 1000 UNIT/ML injection Inject 1,000 Units into the vein 3 (three) times a week.   Humira 40 MG/0.8ML Pskt Generic drug: Adalimumab Inject 40 mg into the skin every 14 (fourteen) days.   ipratropium-albuterol 0.5-2.5 (3) MG/3ML Soln Commonly known as: DUONEB Take 3 mLs by nebulization every 6 (six) hours as needed.    levothyroxine 125 MCG tablet Commonly known as: SYNTHROID Take 125 mcg by mouth daily before breakfast.   lidocaine-prilocaine cream Commonly known as: EMLA Apply 1 application topically every Monday, Wednesday, and Friday.   loperamide 2 MG capsule Commonly known as: IMODIUM Take 2 capsules (4 mg total) by mouth as needed for diarrhea or loose stools.   midodrine 10 MG tablet Commonly known as: PROAMATINE Take 1 tablet (10 mg total) by mouth 3 (three) times daily with meals. What changed:   medication strength  how much to take  when to take this   MIRCERA IJ Inject 150 mg as directed every 14 (fourteen) days.   Nebulizer Devi 1 Device by Does not apply route as needed.   nitroGLYCERIN 0.4 MG SL tablet Commonly known as: NITROSTAT Place 1 tablet (0.4 mg total) under the tongue every 5 (five) minutes as needed for chest pain.   ondansetron 4 MG disintegrating tablet Commonly known as: Zofran ODT Take 1 tablet (4 mg total) by mouth every 8 (eight) hours as needed for nausea or vomiting.   OXYGEN Inhale 2-3 L into the lungs See admin instructions. Use every night and during the day as needed for shortness of breath   predniSONE 5 MG tablet Commonly known as: DELTASONE Take 5 mg by mouth daily with breakfast. What changed: Another medication with the same name was added. Make sure you understand how and when to take each.   predniSONE 10 MG tablet Commonly known as: DELTASONE Prednisone 20 mg PO once daily for 1 week, then decrease to 10 mg PO once daily for 1 week, and then continue your usual dose of Prednisone 5 mg PO once daily What changed: You were already taking a medication with the same name, and this prescription was added. Make sure you understand how and when to take each.   Trelegy Ellipta 100-62.5-25 MCG/INH Aepb Generic drug: Fluticasone-Umeclidin-Vilant Inhale 1 puff into the lungs daily. What changed: Another medication with the same name was  removed. Continue taking this medication, and follow the directions you see here.       Follow-up Information    Nahser, PWonda Cheng MD Follow up.   Specialty: Cardiology Why: office will call  with time and date of appointment  Contact information: [phone]              Signed: SBonnell Public6/27/2021, 3:31 PM

## 2019-11-21 ENCOUNTER — Telehealth: Payer: Self-pay | Admitting: Internal Medicine

## 2019-11-21 ENCOUNTER — Telehealth: Payer: Self-pay | Admitting: Physician Assistant

## 2019-11-21 NOTE — Telephone Encounter (Signed)
Scheduled per los. Called and spoke with patient. Confirmed appt 

## 2019-11-21 NOTE — Progress Notes (Signed)
Reviewed and agree with assessment/plan.   Chesley Mires, MD [facility] Pulmonary/Critical Care 11/21/2019, 7:09 AM Pager:  [phone]

## 2019-11-21 NOTE — Telephone Encounter (Signed)
Attempted to call the patient to make sure she was aware of the change of plan regarding biopsy. I was unable to reach her and left a voicemail to call [place] back. I wanted to discuss that the patient was evaluated by pulmonology last week and it was determined that she is too high risk for bronchoscopy. Therefore, we have placed an order for a CT guided biopsy of the of the thoracic metastatic osseous lesion. I was calling to discuss this in more detail with her. Pending a call back.

## 2019-11-22 ENCOUNTER — Telehealth: Payer: Self-pay | Admitting: Physician Assistant

## 2019-11-22 NOTE — Telephone Encounter (Signed)
I spoke to the patient to make sure she is aware that we are planning on doing a CT biopsy of the bone lesion. She expressed understanding.

## 2019-11-23 ENCOUNTER — Encounter (HOSPITAL_COMMUNITY): Payer: Self-pay

## 2019-11-23 NOTE — Progress Notes (Unsigned)
OF 3  Abagayle M. [facility] Female, 74 y.o., 1946/03/31 MRN:  [id number] Phone:  [phone] Jerilynn Mages) PCP:  [name], MD Coverage:  [facility] ([facility]) 11/29/2019 at 9:00 AM  RE: Blood Thinner/Biopsy Received: Yesterday Message Details  Curt Bears, MD  Kirstie Peri, RN; Heilingoetter, Tobe Sos, PA-C Ok to hold it for the procedure. Thank you.   Previous Messages  ----- Message -----  From: Lenore Cordia  Sent: 11/22/2019 12:16 PM EDT  To: Curt Bears, MD, Ardeen Garland, RN, *  Subject: Blood Thinner/Biopsy               Hello   Ms. Duvall is on Eliquis and  Will need to hold it two days prior to her biopsy  Your permission is needed.   Tyquavious Gamel  ----- Message -----  From: Arne Cleveland, MD  Sent: 11/18/2019  4:26 PM EDT  To: Lenore Cordia  Subject: RE: Biopsy                    Ok   CT core deep bone biopsy T10 body lesion  Met v schmorls node   DDH    ----- Message -----  From: Lenore Cordia  Sent: 11/18/2019  4:14 PM EDT  To: [facility] Procedure Requests  Subject: Biopsy                      Procedure Requested: CT guided core Biopsy    Reason for Procedure: T10 metastatic bone lesion.    Provider Requesting: Dr Julien Nordmann  Provider Telephone: [phone]    Other Info: Rad Exams in [place]

## 2019-11-27 ENCOUNTER — Other Ambulatory Visit: Payer: Self-pay | Admitting: Radiology

## 2019-11-29 ENCOUNTER — Other Ambulatory Visit: Payer: Self-pay

## 2019-11-29 ENCOUNTER — Ambulatory Visit: Payer: Medicare Other | Admitting: Primary Care

## 2019-11-29 ENCOUNTER — Ambulatory Visit (HOSPITAL_COMMUNITY)
Admission: RE | Admit: 2019-11-29 | Discharge: 2019-11-29 | Disposition: A | Discharge status: Home / Self Care | Payer: Medicare Other | Source: Ambulatory Visit | Attending: Internal Medicine | Admitting: Internal Medicine

## 2019-11-29 ENCOUNTER — Ambulatory Visit: Payer: Medicare Other | Admitting: Physician Assistant

## 2019-11-29 ENCOUNTER — Encounter (HOSPITAL_COMMUNITY): Payer: Self-pay

## 2019-11-29 DIAGNOSIS — Z79890 Hormone replacement therapy (postmenopausal): Secondary | ICD-10-CM | POA: Insufficient documentation

## 2019-11-29 DIAGNOSIS — Z7901 Long term (current) use of anticoagulants: Secondary | ICD-10-CM | POA: Diagnosis not present

## 2019-11-29 DIAGNOSIS — M810 Age-related osteoporosis without current pathological fracture: Secondary | ICD-10-CM | POA: Insufficient documentation

## 2019-11-29 DIAGNOSIS — N184 Chronic kidney disease, stage 4 (severe): Secondary | ICD-10-CM | POA: Insufficient documentation

## 2019-11-29 DIAGNOSIS — Z8719 Personal history of other diseases of the digestive system: Secondary | ICD-10-CM | POA: Diagnosis not present

## 2019-11-29 DIAGNOSIS — I480 Paroxysmal atrial fibrillation: Secondary | ICD-10-CM | POA: Diagnosis not present

## 2019-11-29 DIAGNOSIS — Z800 Family history of malignant neoplasm of digestive organs: Secondary | ICD-10-CM | POA: Insufficient documentation

## 2019-11-29 DIAGNOSIS — K5090 Crohn's disease, unspecified, without complications: Secondary | ICD-10-CM | POA: Diagnosis not present

## 2019-11-29 DIAGNOSIS — Z9981 Dependence on supplemental oxygen: Secondary | ICD-10-CM | POA: Insufficient documentation

## 2019-11-29 DIAGNOSIS — Z87891 Personal history of nicotine dependence: Secondary | ICD-10-CM | POA: Diagnosis not present

## 2019-11-29 DIAGNOSIS — Z7983 Long term (current) use of bisphosphonates: Secondary | ICD-10-CM | POA: Insufficient documentation

## 2019-11-29 DIAGNOSIS — Z7951 Long term (current) use of inhaled steroids: Secondary | ICD-10-CM | POA: Insufficient documentation

## 2019-11-29 DIAGNOSIS — M1990 Unspecified osteoarthritis, unspecified site: Secondary | ICD-10-CM | POA: Diagnosis not present

## 2019-11-29 DIAGNOSIS — R569 Unspecified convulsions: Secondary | ICD-10-CM | POA: Insufficient documentation

## 2019-11-29 DIAGNOSIS — M899 Disorder of bone, unspecified: Secondary | ICD-10-CM | POA: Diagnosis present

## 2019-11-29 DIAGNOSIS — Z79899 Other long term (current) drug therapy: Secondary | ICD-10-CM | POA: Diagnosis not present

## 2019-11-29 DIAGNOSIS — I739 Peripheral vascular disease, unspecified: Secondary | ICD-10-CM | POA: Diagnosis not present

## 2019-11-29 DIAGNOSIS — E039 Hypothyroidism, unspecified: Secondary | ICD-10-CM | POA: Insufficient documentation

## 2019-11-29 DIAGNOSIS — Z7952 Long term (current) use of systemic steroids: Secondary | ICD-10-CM | POA: Insufficient documentation

## 2019-11-29 DIAGNOSIS — J449 Chronic obstructive pulmonary disease, unspecified: Secondary | ICD-10-CM | POA: Diagnosis not present

## 2019-11-29 DIAGNOSIS — D631 Anemia in chronic kidney disease: Secondary | ICD-10-CM | POA: Diagnosis not present

## 2019-11-29 DIAGNOSIS — C3482 Malignant neoplasm of overlapping sites of left bronchus and lung: Secondary | ICD-10-CM

## 2019-11-29 DIAGNOSIS — C3412 Malignant neoplasm of upper lobe, left bronchus or lung: Secondary | ICD-10-CM | POA: Insufficient documentation

## 2019-11-29 DIAGNOSIS — D492 Neoplasm of unspecified behavior of bone, soft tissue, and skin: Secondary | ICD-10-CM | POA: Insufficient documentation

## 2019-11-29 LAB — CBC WITH DIFFERENTIAL/PLATELET
Abs Immature Granulocytes: 0.08 10*3/uL — ABNORMAL HIGH (ref 0.00–0.07)
Basophils Absolute: 0 10*3/uL (ref 0.0–0.1)
Basophils Relative: 0 %
Eosinophils Absolute: 0.2 10*3/uL (ref 0.0–0.5)
Eosinophils Relative: 3 %
HCT: 37.6 % (ref 36.0–46.0)
Hemoglobin: 11.2 g/dL — ABNORMAL LOW (ref 12.0–15.0)
Immature Granulocytes: 1 %
Lymphocytes Relative: 38 %
Lymphs Abs: 2.6 10*3/uL (ref 0.7–4.0)
MCH: 31.6 pg (ref 26.0–34.0)
MCHC: 29.8 g/dL — ABNORMAL LOW (ref 30.0–36.0)
MCV: 106.2 fL — ABNORMAL HIGH (ref 80.0–100.0)
Monocytes Absolute: 0.9 10*3/uL (ref 0.1–1.0)
Monocytes Relative: 13 %
Neutro Abs: 3.1 10*3/uL (ref 1.7–7.7)
Neutrophils Relative %: 45 %
Platelets: 140 10*3/uL — ABNORMAL LOW (ref 150–400)
RBC: 3.54 MIL/uL — ABNORMAL LOW (ref 3.87–5.11)
RDW: 14.8 % (ref 11.5–15.5)
WBC: 6.8 10*3/uL (ref 4.0–10.5)
nRBC: 0 % (ref 0.0–0.2)

## 2019-11-29 LAB — BASIC METABOLIC PANEL
Anion gap: 15 (ref 5–15)
BUN: 31 mg/dL — ABNORMAL HIGH (ref 8–23)
CO2: 25 mmol/L (ref 22–32)
Calcium: 9.2 mg/dL (ref 8.9–10.3)
Chloride: 101 mmol/L (ref 98–111)
Creatinine, Ser: 6.68 mg/dL — ABNORMAL HIGH (ref 0.44–1.00)
GFR calc Af Amer: 7 mL/min — ABNORMAL LOW (ref >60)
GFR calc non Af Amer: 6 mL/min — ABNORMAL LOW (ref >60)
Glucose, Bld: 83 mg/dL (ref 70–99)
Potassium: 3.3 mmol/L — ABNORMAL LOW (ref 3.5–5.1)
Sodium: 141 mmol/L (ref 135–145)

## 2019-11-29 LAB — PROTIME-INR
INR: 0.9 (ref 0.8–1.2)
Prothrombin Time: 12.2 seconds (ref 11.4–15.2)

## 2019-11-29 MED ORDER — MIDAZOLAM HCL 2 MG/2ML IJ SOLN
INTRAMUSCULAR | Status: AC
  Filled 2019-11-29: qty 4

## 2019-11-29 MED ORDER — SODIUM CHLORIDE 0.9 % IV SOLN: INTRAVENOUS | Status: DC

## 2019-11-29 MED ORDER — FENTANYL CITRATE (PF) 100 MCG/2ML IJ SOLN
INTRAMUSCULAR | Status: AC | PRN
  Administered 2019-11-29 (×2): 50 ug via INTRAVENOUS

## 2019-11-29 MED ORDER — MIDAZOLAM HCL 2 MG/2ML IJ SOLN
INTRAMUSCULAR | Status: AC | PRN
  Administered 2019-11-29 (×2): 1 mg via INTRAVENOUS

## 2019-11-29 MED ORDER — FENTANYL CITRATE (PF) 100 MCG/2ML IJ SOLN
INTRAMUSCULAR | Status: AC
  Filled 2019-11-29: qty 2

## 2019-11-29 NOTE — Procedures (Signed)
Interventional Radiology Procedure Note  Procedure: CT guided core biopsy of T10 lesion.   Complications: None  Estimated Blood Loss: None  Recommendations: - Technical success - Bedrest x 2 hrs - DC home   Signed,  Criselda Peaches, MD

## 2019-11-29 NOTE — Discharge Instructions (Signed)
Please call Interventional Radiology clinic [phone] with any questions or concerns.  You may remove your dressing and shower tomorrow.   Needle Biopsy of the Bone, Care After This sheet gives you information about how to care for yourself after your procedure. Your health care provider may also give you more specific instructions. If you have problems or questions, contact your health care provider. What can I expect after the procedure? After the procedure, it is common to have soreness or tenderness at the puncture site. Follow these instructions at home: Puncture care   Follow instructions from your health care provider about how to take care of your puncture site. Make sure you: ? Wash your hands with soap and water before and after you change your bandage (dressing). If soap and water are not available, use hand sanitizer. ? Change your dressing as told by your health care provider.  Check your puncture site every day for signs of infection. Check for: ? Redness, swelling, or worsening pain. ? Fluid or blood. ? Warmth. ? Pus or a bad smell. General instructions  Take over-the-counter and prescription medicines only as told by your health care provider.  Do not drive for 24 hours if you were given a sedative during your procedure.  Return to your normal activities as told by your health care provider.  Keep all follow-up visits as told by your health care provider. This is important. Contact a health care provider if:  You have redness, swelling, or worsening pain at the site of your puncture.  You have fluid or blood coming from your puncture site.  Your puncture site feels warm to the touch.  You have pus or a bad smell coming from your puncture site.  You have a fever.  You have persistent nausea or vomiting. Get help right away if:  You develop a rash.  You have difficulty breathing. Summary  After the procedure, it is common to have soreness or  tenderness at the puncture site.  Follow instructions from your health care provider about how to take care of your puncture site.  Contact a health care provider if you have any signs of infection.  Keep all follow-up visits as told by your health care provider. This is important. This information is not intended to replace advice given to you by your health care provider. Make sure you discuss any questions you have with your health care provider. Document Revised: 01/21/2018 Document Reviewed: 01/21/2018 Elsevier Patient Education  [place].    Moderate Conscious Sedation, Adult, Care After These instructions provide you with information about caring for yourself after your procedure. Your health care provider may also give you more specific instructions. Your treatment has been planned according to current medical practices, but problems sometimes occur. Call your health care provider if you have any problems or questions after your procedure. What can I expect after the procedure? After your procedure, it is common:  To feel sleepy for several hours.  To feel clumsy and have poor balance for several hours.  To have poor judgment for several hours.  To vomit if you eat too soon. Follow these instructions at home: For at least 24 hours after the procedure:   Do not: ? Participate in activities where you could fall or become injured. ? Drive. ? Use heavy machinery. ? Drink alcohol. ? Take sleeping pills or medicines that cause drowsiness. ? Make important decisions or sign legal documents. ? Take care of children on your own.  Rest. Eating and drinking  Follow the diet recommended by your health care provider.  If you vomit: ? Drink water, juice, or soup when you can drink without vomiting. ? Make sure you have little or no nausea before eating solid foods. General instructions  Have a responsible adult stay with you until you are awake and alert.  Take  over-the-counter and prescription medicines only as told by your health care provider.  If you smoke, do not smoke without supervision.  Keep all follow-up visits as told by your health care provider. This is important. Contact a health care provider if:  You keep feeling nauseous or you keep vomiting.  You feel light-headed.  You develop a rash.  You have a fever. Get help right away if:  You have trouble breathing. This information is not intended to replace advice given to you by your health care provider. Make sure you discuss any questions you have with your health care provider. Document Revised: 04/24/2017 Document Reviewed: 09/01/2015 Elsevier Patient Education  2020 [employer].

## 2019-11-29 NOTE — H&P (Signed)
Chief Complaint: Patient was seen in consultation today for T10 bone lesion biopsy.  Referring Physician(s): Mohamed,Mohamed  Supervising Physician: Jacqulynn Cadet  Patient Status: [facility] - Out-pt  History of Present Illness: Natasha [name] is a 74 y.o. female with a past medical history significant for arthritis, anemia, Crohn's disease, GERD, pancreatitis, SBO,  Hypothyroidism, PVD, PAF on Eliquis, seizures, COPD and recently discovered left upper lobe mass who presents today for a T10 bone lesion biopsy. [name] was first noted to have multiple bilateral pulmonary nodules on CT chest in May of this year. She underwent a PET scan on 11/01/19 which showed a hypermetabolic left upper lobe mass as well as T10/11 lesions concerning for metastatic disease. She was referred to oncology and then pulmonology for possible bronchoscopy however she was deemed high risk for this procedure and IR has been asked to perform a T10 bone lesion biopsy to further direct care.  [name] states she has been feeling ok overall, she wears 3L of oxygen all the time at home and her appetite has been good. She is very nervous about pain during the procedure today as she tells me she had a fistula on her buttock previously and when she had a procedure for this she could feel every cut they were making and was screaming in pain but no one listened to her, she is fearful this will happen again today but is willing to proceed. She states that her brother had a bronchoscopy before and unfortunately passed away several weeks later which she thinks is related to the procedure, so she would prefer to do the T10 biopsy anyway.   Past Medical History:  Diagnosis Date  . Anal fistula   . Anemia in chronic kidney disease (CKD)   . Aortic insufficiency    Echo 3/18: mod conc LVH, EF 60-65, no RWMA, Gr 1 DD, mod AI, mild MR, normal RVSF, Trivial TR  . Arthritis   . Borderline hypertension   . Bulging disc    L3-L4    . Chronic diarrhea    due to crohn's  . CKD (chronic kidney disease), stage IV ([place])    MWF- [address]  . Crohn's disease ([place])    chronic ileitis  . Dyspnea    with exertion  . Emphysema/COPD ([place])   . GERD (gastroesophageal reflux disease)    denies  . History of blood transfusion   . History of glaucoma   . History of kidney stones   . History of pancreatitis    2008--  mercaptopurine  . History of small bowel obstruction    12-03-2010  due to crohn's ileitis  . Hypertension    no longer on medications  . Hypothyroidism, postsurgical    multinodule w/ hurthle cells  . Osteoporosis   . PAF (paroxysmal atrial fibrillation) ([place])   . Perianal Crohn's disease ([place])   . Peripheral vascular disease ([place])    blood clot behind knee left leg  . Polyarthralgia   . Seizures ([place])    03/2017  . Wears partial dentures    upper    Past Surgical History:  Procedure Laterality Date  . ABDOMINAL HYSTERECTOMY  1990   and  Appendectomy  . AV FISTULA PLACEMENT Left 12/08/2017   Procedure: INSERTION OF ARTERIOVENOUS (AV) GRAFT WITH ARTEGRAFT TO LEFT UPPER ARM;  Surgeon: Conrad Gaines, MD;  Location: [place];  Service: Vascular;  Laterality: Left;  . BASCILIC VEIN TRANSPOSITION Left 01/09/2017   Procedure: LEFT 1ST  STAGE BRACHIAL VEIN TRANSPOSITION;  Surgeon: Conrad Vineyard Lake, MD;  Location: [place];  Service: Vascular;  Laterality: Left;  . BASCILIC VEIN TRANSPOSITION Left 06/16/2017   Procedure: Second Stage BASILIC VEIN TRANSPOSITION  LEFT ARM;  Surgeon: Conrad Rockwell, MD;  Location: [place];  Service: Vascular;  Laterality: Left;  . BIOPSY  03/09/2018   Procedure: BIOPSY;  Surgeon: Wilford Corner, MD;  Location: [facility];  Service: Endoscopy;;  . CHOLECYSTECTOMY    . COLON RESECTION  x3 --  1978,  1987, 1989   ILEAL RESECTION x2/   [place]  . COLONOSCOPY WITH PROPOFOL N/A 09/25/2014   Procedure: COLONOSCOPY WITH PROPOFOL;  Surgeon: Garlan Fair, MD;  [facility];  Service: Endoscopy;  Laterality: N/A;  . COLONOSCOPY WITH PROPOFOL N/A 03/09/2018   Procedure: COLONOSCOPY WITH PROPOFOL;  Surgeon: Wilford Corner, MD;  [facility];  Service: Endoscopy;  Laterality: N/A;  . CYSTOSCOPY W/ URETERAL STENT PLACEMENT Right 11/19/2016   Procedure: CYSTOSCOPY WITH RIGHT RETROGRADE PYELOGRAM/ URETEROSCOPY WITH LASER AND RIGHT URETERAL STENT PLACEMENT;  Surgeon: Ardis Hughs, MD;  [facility];  Service: Urology;  Laterality: Right;  . ESOPHAGOGASTRODUODENOSCOPY  03/04/2012   Procedure: ESOPHAGOGASTRODUODENOSCOPY (EGD);  Surgeon: Garlan Fair, MD;  Location[facility];  Service: Endoscopy;  Laterality: N/A;  . EXAMINATION UNDER ANESTHESIA N/A 09/12/2014   Procedure: EXAM UNDER ANESTHESIA;  Surgeon: Rolm Bookbinder, MD;  Location: [place];  Service: General;  Laterality: N/A;  . EYE SURGERY     laser  . FLEXIBLE SIGMOIDOSCOPY  03/04/2012   Procedure: FLEXIBLE SIGMOIDOSCOPY;  Surgeon: Garlan Fair, MD;  [facility];  Service: Endoscopy;  Laterality: N/A;  . GLAUCOMA SURGERY Bilateral   . HOLMIUM LASER APPLICATION Right 2/68/3419   Procedure: HOLMIUM LASER APPLICATION;  Surgeon: Ardis Hughs, MD;  [facility];  Service: Urology;  Laterality: Right;  . INCISION AND DRAINAGE PERIRECTAL ABSCESS N/A 09/12/2014   Procedure: IRRIGATION AND DEBRIDEMENT PERIRECTAL ABSCESS;  Surgeon: Rolm Bookbinder, MD;  Location: [place];  Service: General;  Laterality: N/A;  . IR FLUORO GUIDE CV LINE RIGHT  03/20/2017  . IR US GUIDE VASC ACCESS RIGHT  03/20/2017  . NEGATIVE SLEEP STUDY  2008  . PLACEMENT OF SETON N/A 12/01/2014   Procedure: PLACEMENT OF SETON;  Surgeon: [name], MD;  Location: [facility];  Service: General;  Laterality: N/A;  . POLYPECTOMY  03/09/2018   Procedure: POLYPECTOMY;  Surgeon: Wilford Corner, MD;  [facility];  Service: Endoscopy;;  . RECTAL EXAM UNDER ANESTHESIA  N/A 12/01/2014   Procedure: RECTAL EXAM UNDER ANESTHESIA;  Surgeon: [name], MD;  [facility];  Service: General;  Laterality: N/A;  . TOTAL THYROIDECTOMY  10-13-2003  . TRANSTHORACIC ECHOCARDIOGRAM  07-11-2013   mild LVH,  ef 55%,  moderate AR,  mild MR and TR,  trivial PR  . TUBAL LIGATION      Allergies: Mercaptopurine and Remicade [infliximab]  Medications: Prior to Admission medications   Medication Sig Start Date End Date Taking? Authorizing Provider  albuterol (VENTOLIN HFA) 108 (90 Base) MCG/ACT inhaler INHALE 2 PUFFS BY MOUTH EVERY 6 HOURS IF NEEDED FOR WHEEZING OR SHORTNESS OF BREATH 05/31/19  Yes Chesley Mires, MD  calcitRIOL (ROCALTROL) 0.25 MCG capsule Take 0.25 mcg by mouth every Monday, Wednesday, and Friday with hemodialysis. 02/03/17  Yes [provider]  calcium acetate (PHOSLO) 667 MG capsule Take 1 capsule (667 mg total) by mouth  3 (three) times daily with meals. 06/06/18  Yes Domenic Polite, MD  calcium-vitamin D (OSCAL WITH D) 500-200 MG-UNIT TABS tablet Take 1 tablet by mouth as needed (with dialysis).    Yes [provider]  cyanocobalamin (,VITAMIN B-12,) 1000 MCG/ML injection Inject 1,000 mcg into the muscle every 30 (thirty) days.    Yes [provider]  diltiazem (CARDIZEM) 30 MG tablet Take 1 tablet (30 mg total) by mouth every 8 (eight) hours. 11/20/19  Yes Dana Allan I, MD  Fluticasone-Umeclidin-Vilant (TRELEGY ELLIPTA) 100-62.5-25 MCG/INH AEPB Inhale 1 puff into the lungs daily. 07/25/19  Yes Chesley Mires, MD  heparin 1000 UNIT/ML injection Inject 1,000 Units into the vein 3 (three) times a week. 12/01/18 11/30/19 Yes [provider]  ipratropium-albuterol (DUONEB) 0.5-2.5 (3) MG/3ML SOLN Take 3 mLs by nebulization every 6 (six) hours as needed. 12/30/18  Yes Fenton Foy, NP  levothyroxine (SYNTHROID, LEVOTHROID) 125 MCG tablet Take 125 mcg by mouth daily before breakfast.    Yes [provider]  lidocaine-prilocaine (EMLA) cream Apply 1 application topically every Monday, Wednesday, and Friday.  11/29/18  Yes [provider]  loperamide (IMODIUM) 2 MG capsule Take 2 capsules (4 mg total) by mouth as needed for diarrhea or loose stools. 03/27/17  Yes Gherghe, Vella Redhead, MD  midodrine (PROAMATINE) 10 MG tablet Take 1 tablet (10 mg total) by mouth 3 (three) times daily with meals. 11/20/19  Yes Bonnell Public, MD  ondansetron (ZOFRAN ODT) 4 MG disintegrating tablet Take 1 tablet (4 mg total) by mouth every 8 (eight) hours as needed for nausea or vomiting. 03/29/18  Yes Deno Etienne, DO  OXYGEN Inhale 2-3 L into the lungs See admin instructions. Use every night and during the day as needed for shortness of breath   Yes [provider]  predniSONE (DELTASONE) 10 MG tablet Prednisone 20 mg PO once daily for 1 week, then decrease to 10 mg PO once daily for 1 week, and then continue your usual dose of Prednisone 5 mg PO once daily 11/20/19  Yes Bonnell Public, MD  predniSONE (DELTASONE) 5 MG tablet Take 5 mg by mouth daily with breakfast.   Yes [provider]  Respiratory Therapy Supplies (NEBULIZER) DEVI 1 Device by Does not apply route as needed. 12/07/18  Yes Fenton Foy, NP  acetaminophen (TYLENOL) 500 MG tablet Take 1,000 mg by mouth every 6 (six) hours as needed for mild pain.     [provider]  Adalimumab (HUMIRA) 40 MG/0.8ML PSKT Inject 40 mg into the skin every 14 (fourteen) days.     [provider]  alendronate (FOSAMAX) 70 MG tablet Take 70 mg by mouth every Monday.  11/06/16   [provider]  ELIQUIS 5 MG TABS tablet Take 2.5 mg by mouth 2 (two) times daily. Takes 1/2 tablet (2.51m) bid 08/23/18   [provider]  fluticasone (FLONASE) 50 MCG/ACT nasal spray Place 1-2 sprays into both nostrils daily. 03/19/19   [provider]  Methoxy PEG-Epoetin Beta (MIRCERA IJ) Inject 150 mg as directed  every 14 (fourteen) days. 06/13/19 06/11/20  [provider]  nitroGLYCERIN (NITROSTAT) 0.4 MG SL tablet Place 1 tablet (0.4 mg total) under the tongue every 5 (five) minutes as needed for chest pain. 05/03/19 11/18/19  Nahser, PWonda Cheng MD     Family History  Problem Relation Age of Onset  . Hypertension Father   . Heart disease Father   . Heart attack Father   .  Diverticulitis Mother   . COPD Mother   . Hypertension Mother   . Heart disease Mother   . Heart attack Mother   . Heart attack Brother   . Colon cancer Brother   . Diabetes Brother   . Thyroid disease Brother     Social History   Socioeconomic History  . Marital status: Married    Spouse name: [name]  . Number of children: 3  . Years of education: 51  . Highest education level: Not on file  Occupational History  . Occupation: Retired    Fish farm manager: RETIRED  Tobacco Use  . Smoking status: Former Smoker    Packs/day: 1.00    Years: 35.00    Pack years: 35.00    Types: Cigarettes    Start date: 02/22/1967    Quit date: 11/27/2017    Years since quitting: 2.0  . Smokeless tobacco: Never Used  Vaping Use  . Vaping Use: Never used  Substance and Sexual Activity  . Alcohol use: No  . Drug use: No  . Sexual activity: Never  Other Topics Concern  . Not on file  Social History Narrative   Patient is married [name]) and lives at home with her husband.   Patient has three children.   Patient is retired on disability.   Patient drinks two cups of caffeine daily.   Patient is right-handed.         Social Determinants of Health   Financial Resource Strain:   . Difficulty of Paying Living Expenses:   Food Insecurity:   . Worried About Charity fundraiser in the Last Year:   . Arboriculturist in the Last Year:   Transportation Needs:   . Film/video editor (Medical):   [name] Lack of Transportation (Non-Medical):   Physical Activity:   . Days of Exercise per Week:   . Minutes of Exercise per Session:     Stress:   . Feeling of Stress :   Social Connections:   . Frequency of Communication with Friends and Family:   . Frequency of Social Gatherings with Friends and Family:   . Attends Religious Services:   . Active Member of Clubs or Organizations:   . Attends Archivist Meetings:   [name] Marital Status:      Review of Systems: A 12 point ROS discussed and pertinent positives are indicated in the HPI above.  All other systems are negative.  Review of Systems  Constitutional: Negative for appetite change, chills and fever.  Respiratory: Negative for cough and shortness of breath.   Cardiovascular: Negative for chest pain.  Gastrointestinal: Negative for abdominal pain, diarrhea, nausea and vomiting.  Musculoskeletal: Negative for back pain.  Neurological: Negative for dizziness and headaches.    Vital Signs: BP 128/74 (BP Location: Right Arm)   Pulse 86   Temp 97.9 F (36.6 C) (Oral)   Resp 18   SpO2 100%   Physical Exam Vitals reviewed.  Constitutional:      General: She is not in acute distress. HENT:     Head: Normocephalic.     Mouth/Throat:     Mouth: Mucous membranes are moist.     Pharynx: Oropharynx is clear. No oropharyngeal exudate or posterior oropharyngeal erythema.  Cardiovascular:     Rate and Rhythm: Normal rate and regular rhythm.  Pulmonary:     Effort: Pulmonary effort is normal.     Breath sounds: Normal breath sounds.  Abdominal:  General: There is no distension.     Palpations: Abdomen is soft.     Tenderness: There is no abdominal tenderness.  Skin:    General: Skin is warm and dry.  Neurological:     Mental Status: She is alert and oriented to person, place, and time.  Psychiatric:        Mood and Affect: Mood normal.        Behavior: Behavior normal.        Thought Content: Thought content normal.        Judgment: Judgment normal.      MD Evaluation Airway: WNL Heart: WNL Abdomen: WNL Chest/ Lungs:  WNL Mallampati/Airway Score: One   Imaging: CT Angio Chest PE W and/or Wo Contrast  Result Date: 11/18/2019 CLINICAL DATA:  Shortness of breath with nausea, dizziness and abdominal pain. Atrial fibrillation with rapid ventricular response. Clinical concern for acute pulmonary embolism. Chronic kidney disease on hemodialysis. EXAM: CT ANGIOGRAPHY CHEST CT ABDOMEN AND PELVIS WITH CONTRAST TECHNIQUE: Multidetector CT imaging of the chest was performed using the standard protocol during bolus administration of intravenous contrast. Multiplanar CT image reconstructions and MIPs were obtained to evaluate the vascular anatomy. Multidetector CT imaging of the abdomen and pelvis was performed using the standard protocol during bolus administration of intravenous contrast. CONTRAST:  134m OMNIPAQUE IOHEXOL 350 MG/ML SOLN COMPARISON:  PET-CT 11/01/2019. Chest CT 10/18/2019 and abdominopelvic CT 09/20/2019. FINDINGS: CTA CHEST FINDINGS Cardiovascular: The pulmonary arteries are well opacified with contrast to the level of the subsegmental branches. There is no evidence of acute pulmonary embolism. Stable mild central enlargement of the pulmonary arteries and mild atherosclerosis of the aorta, great vessels and coronary arteries. Left axillary and brachial stent noted. The heart size is normal. There is no pericardial effusion. Mediastinum/Nodes: Mildly prominent left hilar lymph nodes are grossly stable. There are no enlarged mediastinal or hilar lymph nodes. Previous thyroidectomy. The trachea and esophagus demonstrate no significant findings. Lungs/Pleura: No pleural effusion or pneumothorax. Mild dependent atelectasis at both lung bases. Moderate centrilobular and paraseptal emphysema. Known hypermetabolic left lung nodules are again noted with slight enlargement of the dominant perihilar lingular component measuring up to 2.2 cm on image 41/7 (previously 2.0 cm). Additional nodules are present the left lung,  including a 9 mm left upper lobe nodule on image 24/7 and a spiculated left upper lobe nodule measuring 10 mm on image 26/7. No suspicious right lung nodules. Musculoskeletal/Chest wall: Inferior endplate lucency and surrounding sclerosis again noted within the T10 vertebral body. Although hypermetabolic on prior PET-CT, the appearance is most typical of a Schmorl's node. No acute osseous findings. CT ABDOMEN AND PELVIS FINDINGS Hepatobiliary: The liver is normal in density without suspicious focal abnormality. Probable focal fat adjacent to the falciform ligament. Stable chronic mild intrahepatic and extrahepatic biliary dilatation post cholecystectomy. 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 No evidence of pancreatic mass or surrounding inflammation. Spleen: Normal in size without focal abnormality. Adrenals/Urinary Tract: Both adrenal glands appear normal. Numerous bilateral renal calculi and low-density renal lesions are again noted, similar to previous study. There is bilateral renal cortical thinning and absent contrast excretion consistent with chronic renal failure. No hydronephrosis. No significant bladder findings. 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 No small bowel abnormalities are identified status post right hemicolectomy and anastomosis. There are diverticular changes in the descending and sigmoid colon with possible mild sigmoid colon wall thickening, but no surrounding inflammation or  significant distension. Vascular/Lymphatic: There are no enlarged abdominal or pelvic lymph nodes. Moderate aortic and branch vessel atherosclerosis without acute vascular findings. There is mild reflux of contrast into the IVC and hepatic veins. The portal, superior mesenteric and splenic veins are patent. Reproductive: Hysterectomy. No adnexal mass. Stable curvilinear calcification between the vagina and  rectum. Other: Intact abdominal wall. No ascites or peritoneal nodularity. Stable calcification in the left lower quadrant anterior to the sigmoid colon. Musculoskeletal: No acute or significant osseous findings. Review of the MIP images confirms the above findings. IMPRESSION: 1. No evidence of acute pulmonary embolism or other acute chest process. 2. Known hypermetabolic left lung nodules consistent with metastatic disease. The dominant perihilar lingular component has mildly enlarged. 3. No acute findings in the abdomen or pelvis. 4. Bilateral nephrolithiasis and renal cortical thinning consistent with chronic renal failure. 5. Aortic Atherosclerosis (ICD10-I70.0) and Emphysema (ICD10-J43.9). Electronically Signed   By: Richardean Sale M.D.   On: 11/18/2019 13:16   CT ABDOMEN PELVIS W CONTRAST  Result Date: 11/18/2019 CLINICAL DATA:  Shortness of breath with nausea, dizziness and abdominal pain. Atrial fibrillation with rapid ventricular response. Clinical concern for acute pulmonary embolism. Chronic kidney disease on hemodialysis. EXAM: CT ANGIOGRAPHY CHEST CT ABDOMEN AND PELVIS WITH CONTRAST TECHNIQUE: Multidetector CT imaging of the chest was performed using the standard protocol during bolus administration of intravenous contrast. Multiplanar CT image reconstructions and MIPs were obtained to evaluate the vascular anatomy. Multidetector CT imaging of the abdomen and pelvis was performed using the standard protocol during bolus administration of intravenous contrast. CONTRAST:  169m OMNIPAQUE IOHEXOL 350 MG/ML SOLN COMPARISON:  PET-CT 11/01/2019. Chest CT 10/18/2019 and abdominopelvic CT 09/20/2019. FINDINGS: CTA CHEST FINDINGS Cardiovascular: The pulmonary arteries are well opacified with contrast to the level of the subsegmental branches. There is no evidence of acute pulmonary embolism. Stable mild central enlargement of the pulmonary arteries and mild atherosclerosis of the aorta, great vessels and  coronary arteries. Left axillary and brachial stent noted. The heart size is normal. There is no pericardial effusion. Mediastinum/Nodes: Mildly prominent left hilar lymph nodes are grossly stable. There are no enlarged mediastinal or hilar lymph nodes. Previous thyroidectomy. The trachea and esophagus demonstrate no significant findings. Lungs/Pleura: No pleural effusion or pneumothorax. Mild dependent atelectasis at both lung bases. Moderate centrilobular and paraseptal emphysema. Known hypermetabolic left lung nodules are again noted with slight enlargement of the dominant perihilar lingular component measuring up to 2.2 cm on image 41/7 (previously 2.0 cm). Additional nodules are present the left lung, including a 9 mm left upper lobe nodule on image 24/7 and a spiculated left upper lobe nodule measuring 10 mm on image 26/7. No suspicious right lung nodules. Musculoskeletal/Chest wall: Inferior endplate lucency and surrounding sclerosis again noted within the T10 vertebral body. Although hypermetabolic on prior PET-CT, the appearance is most typical of a Schmorl's node. No acute osseous findings. CT ABDOMEN AND PELVIS FINDINGS Hepatobiliary: The liver is normal in density without suspicious focal abnormality. Probable focal fat adjacent to the falciform ligament. Stable chronic mild intrahepatic and extrahepatic biliary dilatation post cholecystectomy. 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 No evidence of pancreatic mass or surrounding inflammation. Spleen: Normal in size without focal abnormality. Adrenals/Urinary Tract: Both adrenal glands appear normal. Numerous bilateral renal calculi and low-density renal lesions are again noted, similar to previous study. There is bilateral renal cortical thinning and absent contrast excretion consistent with chronic renal failure. No hydronephrosis. No significant bladder findings. Stomach/Bowel:  Gastric fundal  diverticulum again noted with incomplete distention of the stomach. No small bowel abnormalities are identified status post right hemicolectomy and anastomosis. There are diverticular changes in the descending and sigmoid colon with possible mild sigmoid colon wall thickening, but no surrounding inflammation or significant distension. Vascular/Lymphatic: There are no enlarged abdominal or pelvic lymph nodes. Moderate aortic and branch vessel atherosclerosis without acute vascular findings. There is mild reflux of contrast into the IVC and hepatic veins. The portal, superior mesenteric and splenic veins are patent. Reproductive: Hysterectomy. No adnexal mass. Stable curvilinear calcification between the vagina and rectum. Other: Intact abdominal wall. No ascites or peritoneal nodularity. Stable calcification in the left lower quadrant anterior to the sigmoid colon. Musculoskeletal: No acute or significant osseous findings. Review of the MIP images confirms the above findings. IMPRESSION: 1. No evidence of acute pulmonary embolism or other acute chest process. 2. Known hypermetabolic left lung nodules consistent with metastatic disease. The dominant perihilar lingular component has mildly enlarged. 3. No acute findings in the abdomen or pelvis. 4. Bilateral nephrolithiasis and renal cortical thinning consistent with chronic renal failure. 5. Aortic Atherosclerosis (ICD10-I70.0) and Emphysema (ICD10-J43.9). Electronically Signed   By: Richardean Sale M.D.   On: 11/18/2019 13:16   NM PET Image Initial (PI) Skull Base To Thigh  Result Date: 11/01/2019 CLINICAL DATA:  Initial treatment strategy for pulmonary nodule. EXAM: NUCLEAR MEDICINE PET SKULL BASE TO THIGH TECHNIQUE: 5.1 mCi F-18 FDG was injected intravenously. Full-ring PET imaging was performed from the skull base to thigh after the radiotracer. CT data was obtained and used for attenuation correction and anatomic localization. Fasting blood glucose: 74 mg/dl  COMPARISON:  CT chest dated 10/18/2019 FINDINGS: Mediastinal blood pool activity: SUV max 2.5 Liver activity: SUV max NA NECK: Misregistered parotid gland activity secondary to head motion. No hypermetabolic cervical lymphadenopathy. Incidental CT findings: none CHEST: 2.3 x 1.9 cm central left upper lobe mass (series 8/image 33), max SUV 16.2, compatible with primary bronchogenic neoplasm. 8 x 13 mm irregular satellite nodule in the left upper lobe (series 8/image 22), max SUV 2.5, indeterminate. This is similar to the prior, but may reflect infection/inflammation given the additional right lower lobe finding. 8 x 17 mm irregular nodular opacity in the posterior right lower lobe (series 8/image 46), previously 12 x 21 mm, max SUV 1.3. This appearance and improvement suggests resolving infection/inflammation. Focal hypermetabolism in the left suprahilar region, max SUV 13.4. Additional hypermetabolism in left hilar region, max SUV 11.2. Incidental CT findings: Atherosclerotic calcifications of the aortic arch. Enlargement of the main pulmonary artery, suggesting pulmonary arterial hypertension. Mild coronary atherosclerosis of the right coronary artery. Left axillary stent with left arm graft. ABDOMEN/PELVIS: No abnormal hypermetabolism involving the liver, spleen, pancreas, or adrenal glands. No hypermetabolic abdominopelvic lymphadenopathy. Incidental CT findings: Bilateral nonobstructing renal calculi measuring up to 10 mm in the right lower pole (series 4/image 108). Prior cholecystectomy. Posterior gastric diverticulum (series 4/image 87). Atherosclerotic calcifications abdominal aorta and branch vessels. Colonic diverticulosis, without evidence of diverticulitis. Prior hysterectomy. 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 Osseous metastasis at the T11 vertebral body, max SUV 9.1. Incidental CT findings: Degenerative changes involving the visualized thoracolumbar spine. IMPRESSION:  2.3 cm central left upper lobe nodule, compatible with primary bronchogenic neoplasm. Additional right lower lobe nodular opacity is improved, favoring infection/inflammation. Additional left upper lobe nodular opacity is grossly unchanged and indeterminate, but also favors infection/inflammation. Associated left hilar/suprahilar nodal metastases. Osseous metastases at T10-11. Electronically  Signed   By: Julian Hy M.D.   On: 11/01/2019 16:59   DG Chest Port 1 View  Result Date: 11/18/2019 CLINICAL DATA:  Former smoker with chest pain. History of emphysema. Hypertension. EXAM: PORTABLE CHEST 1 VIEW COMPARISON:  CT chest 10/18/2019 FINDINGS: Normal heart size. Pulmonary vascular congestion. No pleural effusion or overt edema. No acute airspace consolidation. Left mid lung nodule corresponding to FDG avid 2.3 cm left upper lobe lung nodule seen on previous CT of the chest is unchanged and remains worrisome for primary bronchogenic carcinoma. The lungs appear hyperinflated and there are diffuse interstitial changes compatible with emphysema. IMPRESSION: 1. Pulmonary vascular congestion without overt edema or pleural effusion. 2. Left upper lobe lung nodule is unchanged and remains worrisome for primary bronchogenic carcinoma. 3.  Emphysema (ICD10-J43.9). Electronically Signed   By: Kerby Moors M.D.   On: 11/18/2019 11:17    Labs:  CBC: Recent Labs    11/15/19 1317 11/18/19 1119 11/19/19 0652 11/29/19 0749  WBC 5.1 4.2 5.6 6.8  HGB 11.6* 10.6* 10.4* 11.2*  HCT 38.3 36.5 35.8* 37.6  PLT 112* 102* 111* 140*    COAGS: Recent Labs    11/29/19 0749  INR 0.9    BMP: Recent Labs    11/15/19 1317 11/18/19 1119 11/19/19 0708 11/29/19 0749  NA 140 138 135 141  K 3.0* 3.2* 4.1 3.3*  CL 103 105 102 101  CO2 24 20* 20* 25  GLUCOSE 75 194* 74 83  BUN 23 36* 41* 31*  CALCIUM 8.7* 8.3* 8.5* 9.2  CREATININE 6.57* 7.98* 9.20* 6.68*  GFRNONAA 6* 5* 4* 6*  GFRAA 7* 5* 4* 7*    LIVER  FUNCTION TESTS: Recent Labs    04/16/19 0352 04/16/19 0352 10/18/19 1522 11/15/19 1317 11/18/19 1119 11/19/19 0708  BILITOT 0.4  --  0.3 0.6 0.2*  --   AST 20  --  17 16 18   --   ALT 11  --  11 10 11   --   ALKPHOS 39  --  68 82 60  --   PROT 5.5*  --  7.4 6.8 5.6*  --   ALBUMIN 2.4*   < > 3.6 3.1* 2.6* 2.5*   < > = values in this interval not displayed.    TUMOR MARKERS: No results for input(s): AFPTM, CEA, CA199, CHROMGRNA in the last 8760 hours.  Assessment and Plan:  74 y/o F with recently noted hypermetabolic LUL mass as well as likely metastatic bone lesions who presents today for a T10 bone biopsy to further direct care.  Patient has been NPO since 8 pm last night, last dose of Eliquis 7/3. Afebrile, WBC 6.8, hgb 11.2, plt 140, INR 0.9, creatinine 6.68 (near baseline per chart).  Risks and benefits of T10 bone lesion was discussed with the patient and/or patient's family including, but not limited to bleeding, infection, damage to adjacent structures or low yield requiring additional tests.  All of the questions were answered and there is agreement to proceed.  Consent signed and in chart.   Thank you for this interesting consult.  I greatly enjoyed meeting KHLOEI SPIKER and look forward to participating in their care.  A copy of this report was sent to the requesting provider on this date.  Electronically Signed: Joaquim Nam, PA-C 11/29/2019, 8:15 AM   I spent a total of 30 Minutes   in face to face in clinical consultation, greater than 50% of which was counseling/coordinating care for  T10 bone lesion biopsy.

## 2019-12-01 ENCOUNTER — Ambulatory Visit (HOSPITAL_COMMUNITY)
Admission: RE | Admit: 2019-12-01 | Discharge: 2019-12-01 | Disposition: A | Discharge status: Home / Self Care | Payer: Medicare Other | Source: Ambulatory Visit | Attending: Physician Assistant | Admitting: Physician Assistant

## 2019-12-01 ENCOUNTER — Other Ambulatory Visit: Payer: Self-pay

## 2019-12-01 DIAGNOSIS — R918 Other nonspecific abnormal finding of lung field: Secondary | ICD-10-CM | POA: Insufficient documentation

## 2019-12-01 MED ORDER — GADOBUTROL 1 MMOL/ML IV SOLN
5.0000 mL | Freq: Once | INTRAVENOUS | Status: AC | PRN
  Administered 2019-12-01: 5 mL via INTRAVENOUS

## 2019-12-05 LAB — SURGICAL PATHOLOGY

## 2019-12-06 ENCOUNTER — Other Ambulatory Visit: Payer: Self-pay

## 2019-12-06 ENCOUNTER — Inpatient Hospital Stay: Payer: Medicare Other | Attending: Physician Assistant | Admitting: Internal Medicine

## 2019-12-06 ENCOUNTER — Encounter: Payer: Self-pay | Admitting: Internal Medicine

## 2019-12-06 VITALS — BP 131/80 | HR 99 | Temp 98.2°F | Resp 18 | Ht 64.0 in | Wt 112.2 lb

## 2019-12-06 DIAGNOSIS — C7951 Secondary malignant neoplasm of bone: Secondary | ICD-10-CM | POA: Diagnosis present

## 2019-12-06 DIAGNOSIS — N184 Chronic kidney disease, stage 4 (severe): Secondary | ICD-10-CM | POA: Diagnosis not present

## 2019-12-06 DIAGNOSIS — Z87891 Personal history of nicotine dependence: Secondary | ICD-10-CM | POA: Insufficient documentation

## 2019-12-06 DIAGNOSIS — C3412 Malignant neoplasm of upper lobe, left bronchus or lung: Secondary | ICD-10-CM | POA: Diagnosis not present

## 2019-12-06 DIAGNOSIS — Z79899 Other long term (current) drug therapy: Secondary | ICD-10-CM | POA: Diagnosis not present

## 2019-12-06 DIAGNOSIS — Z7952 Long term (current) use of systemic steroids: Secondary | ICD-10-CM | POA: Insufficient documentation

## 2019-12-06 DIAGNOSIS — K219 Gastro-esophageal reflux disease without esophagitis: Secondary | ICD-10-CM | POA: Insufficient documentation

## 2019-12-06 DIAGNOSIS — I120 Hypertensive chronic kidney disease with stage 5 chronic kidney disease or end stage renal disease: Secondary | ICD-10-CM | POA: Diagnosis not present

## 2019-12-06 DIAGNOSIS — C7802 Secondary malignant neoplasm of left lung: Secondary | ICD-10-CM | POA: Diagnosis not present

## 2019-12-06 DIAGNOSIS — D631 Anemia in chronic kidney disease: Secondary | ICD-10-CM | POA: Diagnosis not present

## 2019-12-06 DIAGNOSIS — K5090 Crohn's disease, unspecified, without complications: Secondary | ICD-10-CM | POA: Insufficient documentation

## 2019-12-06 DIAGNOSIS — Z7901 Long term (current) use of anticoagulants: Secondary | ICD-10-CM | POA: Insufficient documentation

## 2019-12-06 DIAGNOSIS — J441 Chronic obstructive pulmonary disease with (acute) exacerbation: Secondary | ICD-10-CM

## 2019-12-06 DIAGNOSIS — E890 Postprocedural hypothyroidism: Secondary | ICD-10-CM | POA: Insufficient documentation

## 2019-12-06 DIAGNOSIS — I480 Paroxysmal atrial fibrillation: Secondary | ICD-10-CM | POA: Insufficient documentation

## 2019-12-06 NOTE — Progress Notes (Signed)
[place] [phone]   [phone]  OFFICE PROGRESS NOTE  [name], MD [address] [place] [address]  DIAGNOSIS: Suspicious stage IIIa/IV (T2 a, N2, M0/M1 C) lung cancer presented with left upper lobe nodule in addition to left hilar/suprahilar nodal metastasis as well as suspicious T10/T11 bone lesion, pending tissue diagnosis.  PRIOR THERAPY: None  CURRENT THERAPY: None  INTERVAL HISTORY: Natasha [name] 74 y.o. female returns to the clinic today for follow-up visit accompanied by her husband. The patient is feeling fine today with no concerning complaints except for the baseline shortness of breath and she is currently on home oxygen. She has no significant chest pain, cough or hemoptysis. She is currently on hemodialysis Monday, Wednesday and Friday. The patient denied having any nausea, vomiting, diarrhea or constipation. She denied having any headache or visual changes. She underwent CT-guided core biopsy of the T10 inferior endplate lesion by interventional radiology and the final pathology ([id number]) showed atypical lymphoid infiltrates with no evidence of carcinoma. The patient also had MRI of the brain that was unremarkable for any disease metastasis to the brain. She is here today for evaluation and discussion of her condition based on the above findings.  MEDICAL HISTORY: Past Medical History:  Diagnosis Date  . Anal fistula   . Anemia in chronic kidney disease (CKD)   . Aortic insufficiency    Echo 3/18: mod conc LVH, EF 60-65, no RWMA, Gr 1 DD, mod AI, mild MR, normal RVSF, Trivial TR  . Arthritis   . Borderline hypertension   . Bulging disc    L3-L4  . Chronic diarrhea    due to crohn's  . CKD (chronic kidney disease), stage IV ([place])    MWF- [address]  . Crohn's disease ([place])    chronic ileitis  . Dyspnea    with exertion  . Emphysema/COPD ([place])   . GERD (gastroesophageal reflux  disease)    denies  . History of blood transfusion   . History of glaucoma   . History of kidney stones   . History of pancreatitis    2008--  mercaptopurine  . History of small bowel obstruction    12-03-2010  due to crohn's ileitis  . Hypertension    no longer on medications  . Hypothyroidism, postsurgical    multinodule w/ hurthle cells  . Osteoporosis   . PAF (paroxysmal atrial fibrillation) ([place])   . Perianal Crohn's disease ([place])   . Peripheral vascular disease ([place])    blood clot behind knee left leg  . Polyarthralgia   . Seizures ([place])    03/2017  . Wears partial dentures    upper    ALLERGIES:  is allergic to mercaptopurine and remicade [infliximab].  MEDICATIONS:  Current Outpatient Medications  Medication Sig Dispense Refill  . acetaminophen (TYLENOL) 500 MG tablet Take 1,000 mg by mouth every 6 (six) hours as needed for mild pain.     . Adalimumab (HUMIRA) 40 MG/0.8ML PSKT Inject 40 mg into the skin every 14 (fourteen) days.     [name] albuterol (VENTOLIN HFA) 108 (90 Base) MCG/ACT inhaler INHALE 2 PUFFS BY MOUTH EVERY 6 HOURS IF NEEDED FOR WHEEZING OR SHORTNESS OF BREATH 8.5 g 12  . alendronate (FOSAMAX) 70 MG tablet Take 70 mg by mouth every Monday.   1  . calcitRIOL (ROCALTROL) 0.25 MCG capsule Take 0.25 mcg by mouth every Monday, Wednesday, and Friday with hemodialysis.  0  .  calcium acetate (PHOSLO) 667 MG capsule Take 1 capsule (667 mg total) by mouth 3 (three) times daily with meals. 90 capsule 0  . calcium-vitamin D (OSCAL WITH D) 500-200 MG-UNIT TABS tablet Take 1 tablet by mouth as needed (with dialysis).     . cyanocobalamin (,VITAMIN B-12,) 1000 MCG/ML injection Inject 1,000 mcg into the muscle every 30 (thirty) days.     [name] diltiazem (CARDIZEM) 30 MG tablet Take 1 tablet (30 mg total) by mouth every 8 (eight) hours. 90 tablet 0  . ELIQUIS 5 MG TABS tablet Take 2.5 mg by mouth 2 (two) times daily. Takes 1/2 tablet (2.72m) bid    . fluticasone (FLONASE) 50  MCG/ACT nasal spray Place 1-2 sprays into both nostrils daily.    . Fluticasone-Umeclidin-Vilant (TRELEGY ELLIPTA) 100-62.5-25 MCG/INH AEPB Inhale 1 puff into the lungs daily. 60 each 6  . ipratropium-albuterol (DUONEB) 0.5-2.5 (3) MG/3ML SOLN Take 3 mLs by nebulization every 6 (six) hours as needed. 360 mL 3  . levothyroxine (SYNTHROID, LEVOTHROID) 125 MCG tablet Take 125 mcg by mouth daily before breakfast.     . lidocaine-prilocaine (EMLA) cream Apply 1 application topically every Monday, Wednesday, and Friday.     . loperamide (IMODIUM) 2 MG capsule Take 2 capsules (4 mg total) by mouth as needed for diarrhea or loose stools. 30 capsule 0  . Methoxy PEG-Epoetin Beta (MIRCERA IJ) Inject 150 mg as directed every 14 (fourteen) days.    . midodrine (PROAMATINE) 10 MG tablet Take 1 tablet (10 mg total) by mouth 3 (three) times daily with meals. 90 tablet 0  . nitroGLYCERIN (NITROSTAT) 0.4 MG SL tablet Place 1 tablet (0.4 mg total) under the tongue every 5 (five) minutes as needed for chest pain. 25 tablet 3  . ondansetron (ZOFRAN ODT) 4 MG disintegrating tablet Take 1 tablet (4 mg total) by mouth every 8 (eight) hours as needed for nausea or vomiting. 20 tablet 0  . OXYGEN Inhale 2-3 L into the lungs See admin instructions. Use every night and during the day as needed for shortness of breath    . predniSONE (DELTASONE) 10 MG tablet Prednisone 20 mg PO once daily for 1 week, then decrease to 10 mg PO once daily for 1 week, and then continue your usual dose of Prednisone 5 mg PO once daily 21 tablet 0  . predniSONE (DELTASONE) 5 MG tablet Take 5 mg by mouth daily with breakfast.    . Respiratory Therapy Supplies (NEBULIZER) DEVI 1 Device by Does not apply route as needed. 1 Device 0   No current facility-administered medications for this visit.    SURGICAL HISTORY:  Past Surgical History:  Procedure Laterality Date  . ABDOMINAL HYSTERECTOMY  1990   and  Appendectomy  . AV FISTULA PLACEMENT Left  12/08/2017   Procedure: INSERTION OF ARTERIOVENOUS (AV) GRAFT WITH ARTEGRAFT TO LEFT UPPER ARM;  Surgeon: CConrad Satartia MD;  [facility]  Service: Vascular;  Laterality: Left;  . BASCILIC VEIN TRANSPOSITION Left 01/09/2017   Procedure: LEFT 1ST STAGE BRACHIAL VEIN TRANSPOSITION;  Surgeon: CConrad Winchester MD;  [facility]  Service: Vascular;  Laterality: Left;  . BASCILIC VEIN TRANSPOSITION Left 06/16/2017   Procedure: Second Stage BASILIC VEIN TRANSPOSITION  LEFT ARM;  Surgeon: CConrad Bonita MD;  [facility]  Service: Vascular;  Laterality: Left;  . BIOPSY  03/09/2018   Procedure: BIOPSY;  Surgeon: SWilford Corner MD;  [facility];  Service: Endoscopy;;  . CHOLECYSTECTOMY    .  COLON RESECTION  x3 --  1978,  1987, 1989   ILEAL RESECTION x2/   [place]  . COLONOSCOPY WITH PROPOFOL N/A 09/25/2014   Procedure: COLONOSCOPY WITH PROPOFOL;  Surgeon: Garlan Fair, MD;  [facility];  Service: Endoscopy;  Laterality: N/A;  . COLONOSCOPY WITH PROPOFOL N/A 03/09/2018   Procedure: COLONOSCOPY WITH PROPOFOL;  Surgeon: Wilford Corner, MD;  [facility];  Service: Endoscopy;  Laterality: N/A;  . CYSTOSCOPY W/ URETERAL STENT PLACEMENT Right 11/19/2016   Procedure: CYSTOSCOPY WITH RIGHT RETROGRADE PYELOGRAM/ URETEROSCOPY WITH LASER AND RIGHT URETERAL STENT PLACEMENT;  Surgeon: Ardis Hughs, MD;  [facility];  Service: Urology;  Laterality: Right;  . ESOPHAGOGASTRODUODENOSCOPY  03/04/2012   Procedure: ESOPHAGOGASTRODUODENOSCOPY (EGD);  Surgeon: Garlan Fair, MD;  Location[facility];  Service: Endoscopy;  Laterality: N/A;  . EXAMINATION UNDER ANESTHESIA N/A 09/12/2014   Procedure: EXAM UNDER ANESTHESIA;  Surgeon: Rolm Bookbinder, MD;  Location: [facility];  Service: General;  Laterality: N/A;  . EYE SURGERY     laser  . FLEXIBLE SIGMOIDOSCOPY  03/04/2012   Procedure: FLEXIBLE SIGMOIDOSCOPY;  Surgeon: Garlan Fair, MD;  [facility];  Service: Endoscopy;  Laterality: N/A;  . GLAUCOMA SURGERY Bilateral   . HOLMIUM LASER APPLICATION Right 4/58/0998   Procedure: HOLMIUM LASER APPLICATION;  Surgeon: Ardis Hughs, MD;  [facility];  Service: Urology;  Laterality: Right;  . INCISION AND DRAINAGE PERIRECTAL ABSCESS N/A 09/12/2014   Procedure: IRRIGATION AND DEBRIDEMENT PERIRECTAL ABSCESS;  Surgeon: Rolm Bookbinder, MD;  Location: [facility];  Service: General;  Laterality: N/A;  . IR FLUORO GUIDE CV LINE RIGHT  03/20/2017  . IR US GUIDE VASC ACCESS RIGHT  03/20/2017  . NEGATIVE SLEEP STUDY  2008  . PLACEMENT OF SETON N/A 12/01/2014   Procedure: PLACEMENT OF SETON;  Surgeon: [name], MD;  Location: [facility];  Service: General;  Laterality: N/A;  . POLYPECTOMY  03/09/2018   Procedure: POLYPECTOMY;  Surgeon: Wilford Corner, MD;  [facility];  Service: Endoscopy;;  . RECTAL EXAM UNDER ANESTHESIA N/A 12/01/2014   Procedure: RECTAL EXAM UNDER ANESTHESIA;  Surgeon: [name], MD;  Location: [facility];  Service: General;  Laterality: N/A;  . TOTAL THYROIDECTOMY  10-13-2003  . TRANSTHORACIC ECHOCARDIOGRAM  07-11-2013   mild LVH,  ef 55%,  moderate AR,  mild MR and TR,  trivial PR  . TUBAL LIGATION      REVIEW OF SYSTEMS:  Constitutional: positive for fatigue Eyes: negative Ears, nose, mouth, throat, and face: negative Respiratory: positive for cough and dyspnea on exertion Cardiovascular: negative Gastrointestinal: negative Genitourinary:negative Integument/breast: negative Hematologic/lymphatic: negative Musculoskeletal:positive for muscle weakness Neurological: negative Behavioral/Psych: negative Endocrine: negative Allergic/Immunologic: negative   PHYSICAL EXAMINATION: General appearance: alert, cooperative, fatigued and no distress Head: Normocephalic, without obvious abnormality, atraumatic Neck: no adenopathy, no JVD, supple, symmetrical,  trachea midline and thyroid not enlarged, symmetric, no tenderness/mass/nodules Lymph nodes: Cervical, supraclavicular, and axillary nodes normal. Resp: clear to auscultation bilaterally Back: symmetric, no curvature. ROM normal. No CVA tenderness. Cardio: regular rate and rhythm, S1, S2 normal, no murmur, click, rub or gallop GI: soft, non-tender; bowel sounds normal; no masses,  no organomegaly Extremities: extremities normal, atraumatic, no cyanosis or edema Neurologic: Alert and oriented X 3, normal strength and tone. Normal symmetric reflexes. Normal coordination and gait  ECOG PERFORMANCE STATUS: 1 - Symptomatic but completely ambulatory  Blood pressure 131/80, pulse 99, temperature 98.2 F (36.8 C), temperature  source Temporal, resp. rate 18, height 5' 4"  (0.626 m), weight 112 lb 3.2 oz (50.9 kg), SpO2 97 %.  LABORATORY DATA: Lab Results  Component Value Date   WBC 6.8 11/29/2019   HGB 11.2 (L) 11/29/2019   HCT 37.6 11/29/2019   MCV 106.2 (H) 11/29/2019   PLT 140 (L) 11/29/2019      Chemistry      Component Value Date/Time   NA 141 11/29/2019 0749   NA 142 07/30/2016 1156   K 3.3 (L) 11/29/2019 0749   CL 101 11/29/2019 0749   CO2 25 11/29/2019 0749   BUN 31 (H) 11/29/2019 0749   BUN 31 (H) 07/30/2016 1156   CREATININE 6.68 (H) 11/29/2019 0749   CREATININE 6.57 (HH) 11/15/2019 1317      Component Value Date/Time   CALCIUM 9.2 11/29/2019 0749   CALCIUM 5.9 (LL) 03/29/2014 2304   ALKPHOS 60 11/18/2019 1119   AST 18 11/18/2019 1119   AST 16 11/15/2019 1317   ALT 11 11/18/2019 1119   ALT 10 11/15/2019 1317   BILITOT 0.2 (L) 11/18/2019 1119   BILITOT 0.6 11/15/2019 1317       RADIOGRAPHIC STUDIES: CT Angio Chest PE W and/or Wo Contrast  Result Date: 11/18/2019 CLINICAL DATA:  Shortness of breath with nausea, dizziness and abdominal pain. Atrial fibrillation with rapid ventricular response. Clinical concern for acute pulmonary embolism. Chronic kidney disease on  hemodialysis. EXAM: CT ANGIOGRAPHY CHEST CT ABDOMEN AND PELVIS WITH CONTRAST TECHNIQUE: Multidetector CT imaging of the chest was performed using the standard protocol during bolus administration of intravenous contrast. Multiplanar CT image reconstructions and MIPs were obtained to evaluate the vascular anatomy. Multidetector CT imaging of the abdomen and pelvis was performed using the standard protocol during bolus administration of intravenous contrast. CONTRAST:  122m OMNIPAQUE IOHEXOL 350 MG/ML SOLN COMPARISON:  PET-CT 11/01/2019. Chest CT 10/18/2019 and abdominopelvic CT 09/20/2019. FINDINGS: CTA CHEST FINDINGS Cardiovascular: The pulmonary arteries are well opacified with contrast to the level of the subsegmental branches. There is no evidence of acute pulmonary embolism. Stable mild central enlargement of the pulmonary arteries and mild atherosclerosis of the aorta, great vessels and coronary arteries. Left axillary and brachial stent noted. The heart size is normal. There is no pericardial effusion. Mediastinum/Nodes: Mildly prominent left hilar lymph nodes are grossly stable. There are no enlarged mediastinal or hilar lymph nodes. Previous thyroidectomy. The trachea and esophagus demonstrate no significant findings. Lungs/Pleura: No pleural effusion or pneumothorax. Mild dependent atelectasis at both lung bases. Moderate centrilobular and paraseptal emphysema. Known hypermetabolic left lung nodules are again noted with slight enlargement of the dominant perihilar lingular component measuring up to 2.2 cm on image 41/7 (previously 2.0 cm). Additional nodules are present the left lung, including a 9 mm left upper lobe nodule on image 24/7 and a spiculated left upper lobe nodule measuring 10 mm on image 26/7. No suspicious right lung nodules. Musculoskeletal/Chest wall: Inferior endplate lucency and surrounding sclerosis again noted within the T10 vertebral body. Although hypermetabolic on prior PET-CT, the  appearance is most typical of a Schmorl's node. No acute osseous findings. CT ABDOMEN AND PELVIS FINDINGS Hepatobiliary: The liver is normal in density without suspicious focal abnormality. Probable focal fat adjacent to the falciform ligament. Stable chronic mild intrahepatic and extrahepatic biliary dilatation post cholecystectomy. 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 No evidence of pancreatic mass or surrounding inflammation. Spleen: Normal in size without focal abnormality. Adrenals/Urinary Tract: Both adrenal glands appear normal.  Numerous bilateral renal calculi and low-density renal lesions are again noted, similar to previous study. There is bilateral renal cortical thinning and absent contrast excretion consistent with chronic renal failure. No hydronephrosis. No significant bladder findings. 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 No small bowel abnormalities are identified status post right hemicolectomy and anastomosis. There are diverticular changes in the descending and sigmoid colon with possible mild sigmoid colon wall thickening, but no surrounding inflammation or significant distension. Vascular/Lymphatic: There are no enlarged abdominal or pelvic lymph nodes. Moderate aortic and branch vessel atherosclerosis without acute vascular findings. There is mild reflux of contrast into the IVC and hepatic veins. The portal, superior mesenteric and splenic veins are patent. Reproductive: Hysterectomy. No adnexal mass. Stable curvilinear calcification between the vagina and rectum. Other: Intact abdominal wall. No ascites or peritoneal nodularity. Stable calcification in the left lower quadrant anterior to the sigmoid colon. Musculoskeletal: No acute or significant osseous findings. Review of the MIP images confirms the above findings. IMPRESSION: 1. No evidence of acute pulmonary embolism or other  acute chest process. 2. Known hypermetabolic left lung nodules consistent with metastatic disease. The dominant perihilar lingular component has mildly enlarged. 3. No acute findings in the abdomen or pelvis. 4. Bilateral nephrolithiasis and renal cortical thinning consistent with chronic renal failure. 5. Aortic Atherosclerosis (ICD10-I70.0) and Emphysema (ICD10-J43.9). Electronically Signed   By: Richardean Sale M.D.   On: 11/18/2019 13:16   MR Brain W Wo Contrast  Result Date: 12/02/2019 CLINICAL DATA:  Lung cancer staging EXAM: MRI HEAD WITHOUT AND WITH CONTRAST TECHNIQUE: Multiplanar, multiecho pulse sequences of the brain and surrounding structures were obtained without and with intravenous contrast. CONTRAST:  64m GADAVIST GADOBUTROL 1 MMOL/ML IV SOLN COMPARISON:  12/25/2005 head CT FINDINGS: Brain: No enhancement or swelling to suggest metastatic disease. T1 shortening in the basal ganglia that is likely from mineralization. Chronic small vessel ischemic change primarily seen in the pons. Normal brain volume. No recent infarct, hemorrhage, hydrocephalus, or collection. Vascular: Normal flow voids and vascular enhancements Skull and upper cervical spine: Normal marrow signal Sinuses/Orbits: Bilateral cataract resection. IMPRESSION: No evidence of metastatic disease. Electronically Signed   By: JMonte FantasiaM.D.   On: 12/02/2019 11:18   CT ABDOMEN PELVIS W CONTRAST  Result Date: 11/18/2019 CLINICAL DATA:  Shortness of breath with nausea, dizziness and abdominal pain. Atrial fibrillation with rapid ventricular response. Clinical concern for acute pulmonary embolism. Chronic kidney disease on hemodialysis. EXAM: CT ANGIOGRAPHY CHEST CT ABDOMEN AND PELVIS WITH CONTRAST TECHNIQUE: Multidetector CT imaging of the chest was performed using the standard protocol during bolus administration of intravenous contrast. Multiplanar CT image reconstructions and MIPs were obtained to evaluate the vascular anatomy.  Multidetector CT imaging of the abdomen and pelvis was performed using the standard protocol during bolus administration of intravenous contrast. CONTRAST:  1054mOMNIPAQUE IOHEXOL 350 MG/ML SOLN COMPARISON:  PET-CT 11/01/2019. Chest CT 10/18/2019 and abdominopelvic CT 09/20/2019. FINDINGS: CTA CHEST FINDINGS Cardiovascular: The pulmonary arteries are well opacified with contrast to the level of the subsegmental branches. There is no evidence of acute pulmonary embolism. Stable mild central enlargement of the pulmonary arteries and mild atherosclerosis of the aorta, great vessels and coronary arteries. Left axillary and brachial stent noted. The heart size is normal. There is no pericardial effusion. Mediastinum/Nodes: Mildly prominent left hilar lymph nodes are grossly stable. There are no enlarged mediastinal or hilar lymph nodes. Previous thyroidectomy. The trachea and esophagus demonstrate no significant findings. Lungs/Pleura:  No pleural effusion or pneumothorax. Mild dependent atelectasis at both lung bases. Moderate centrilobular and paraseptal emphysema. Known hypermetabolic left lung nodules are again noted with slight enlargement of the dominant perihilar lingular component measuring up to 2.2 cm on image 41/7 (previously 2.0 cm). Additional nodules are present the left lung, including a 9 mm left upper lobe nodule on image 24/7 and a spiculated left upper lobe nodule measuring 10 mm on image 26/7. No suspicious right lung nodules. Musculoskeletal/Chest wall: Inferior endplate lucency and surrounding sclerosis again noted within the T10 vertebral body. Although hypermetabolic on prior PET-CT, the appearance is most typical of a Schmorl's node. No acute osseous findings. CT ABDOMEN AND PELVIS FINDINGS Hepatobiliary: The liver is normal in density without suspicious focal abnormality. Probable focal fat adjacent to the falciform ligament. Stable chronic mild intrahepatic and extrahepatic biliary dilatation  post cholecystectomy. 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 No evidence of pancreatic mass or surrounding inflammation. Spleen: Normal in size without focal abnormality. Adrenals/Urinary Tract: Both adrenal glands appear normal. Numerous bilateral renal calculi and low-density renal lesions are again noted, similar to previous study. There is bilateral renal cortical thinning and absent contrast excretion consistent with chronic renal failure. No hydronephrosis. No significant bladder findings. 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 No small bowel abnormalities are identified status post right hemicolectomy and anastomosis. There are diverticular changes in the descending and sigmoid colon with possible mild sigmoid colon wall thickening, but no surrounding inflammation or significant distension. Vascular/Lymphatic: There are no enlarged abdominal or pelvic lymph nodes. Moderate aortic and branch vessel atherosclerosis without acute vascular findings. There is mild reflux of contrast into the IVC and hepatic veins. The portal, superior mesenteric and splenic veins are patent. Reproductive: Hysterectomy. No adnexal mass. Stable curvilinear calcification between the vagina and rectum. Other: Intact abdominal wall. No ascites or peritoneal nodularity. Stable calcification in the left lower quadrant anterior to the sigmoid colon. Musculoskeletal: No acute or significant osseous findings. Review of the MIP images confirms the above findings. IMPRESSION: 1. No evidence of acute pulmonary embolism or other acute chest process. 2. Known hypermetabolic left lung nodules consistent with metastatic disease. The dominant perihilar lingular component has mildly enlarged. 3. No acute findings in the abdomen or pelvis. 4. Bilateral nephrolithiasis and renal cortical thinning consistent with chronic renal failure. 5.  Aortic Atherosclerosis (ICD10-I70.0) and Emphysema (ICD10-J43.9). Electronically Signed   By: Richardean Sale M.D.   On: 11/18/2019 13:16   CT Biopsy  Result Date: 11/29/2019 INDICATION: 74 year old female with presumed left lung cancer and a solitary hypermetabolic lesion in the inferior endplate of G86. The imaging appearance of the lesion may represent either a metastasis or a metabolically active degenerative Schmorl's nodule. She presents for CT-guided biopsy to facilitate tissue diagnosis. EXAM: CT-guided core biopsy T10 osseous lesion MEDICATIONS: None. ANESTHESIA/SEDATION: Moderate (conscious) sedation was employed during this procedure. A total of Versed 2 mg and Fentanyl 100 mcg was administered intravenously. Moderate Sedation Time: 22 minutes. The patient's level of consciousness and vital signs were monitored continuously by radiology nursing throughout the procedure under my direct supervision. FLUOROSCOPY TIME:  None. COMPLICATIONS: None immediate. PROCEDURE: Informed written consent was obtained from the patient after a thorough discussion of the procedural risks, benefits and alternatives. All questions were addressed. A timeout was performed prior to the initiation of the procedure. A planning axial CT scan was performed. The lesion in the inferior endplate of P61 was  successfully identified. A suitable skin entry site for a right paraspinal approach was identified and marked. The overlying skin was sterilely prepped and draped in the standard fashion using chlorhexidine skin prep. Local anesthesia was attained by infiltration with 1% lidocaine. A small dermatotomy was made. Under intermittent CT guidance, an 11 gauge bone trocar needle was carefully advanced through the pedicle and into the vertebral body. A 9 gauge core biopsy was then obtained coaxially. Location of the biopsy needle was confirmed to be within the margin of the lesion on CT imaging. The bone trocar was removed. Follow-up CT  imaging demonstrates no evidence of immediate complication. IMPRESSION: Technically successful CT-guided core biopsy of T10 inferior endplate lesion. Electronically Signed   By: Jacqulynn Cadet M.D.   On: 11/29/2019 13:30   DG Chest Port 1 View  Result Date: 11/18/2019 CLINICAL DATA:  Former smoker with chest pain. History of emphysema. Hypertension. EXAM: PORTABLE CHEST 1 VIEW COMPARISON:  CT chest 10/18/2019 FINDINGS: Normal heart size. Pulmonary vascular congestion. No pleural effusion or overt edema. No acute airspace consolidation. Left mid lung nodule corresponding to FDG avid 2.3 cm left upper lobe lung nodule seen on previous CT of the chest is unchanged and remains worrisome for primary bronchogenic carcinoma. The lungs appear hyperinflated and there are diffuse interstitial changes compatible with emphysema. IMPRESSION: 1. Pulmonary vascular congestion without overt edema or pleural effusion. 2. Left upper lobe lung nodule is unchanged and remains worrisome for primary bronchogenic carcinoma. 3.  Emphysema (ICD10-J43.9). Electronically Signed   By: Kerby Moors M.D.   On: 11/18/2019 11:17    ASSESSMENT AND PLAN: This is a very pleasant 74 years old African-American female with suspicious stage IIIa/IV lung cancer presented with left upper lobe lung mass in addition to left hilar/suprahilar and suspicious bone metastasis at T10/T11, pending tissue diagnosis. I had a lengthy discussion with the patient and her husband today about her current condition and treatment options. Unfortunately the biopsy of the T11 inferior endplate lesion was not conclusive and showed atypical lymphoid tissue. Her MRI of the brain was negative and discussed the results with the patient. She is a high risk for tissue diagnosis from the lung based on her evaluation by pulmonary medicine. I will discuss her case again at the weekly thoracic conference this Thursday to see the best option for a biopsy from one of the  lung lesion. We will call the patient with update about her condition and the best option for biopsy after the tumor board discussion. Other option would be consideration of radiotherapy without a biopsy but in the presence of the hilar lymphadenopathy she probably will need hypofractionated treatment rather than SBRT. I will arrange for the patient her follow-up visit based on the recommendation from the tumor board. For the end-stage renal disease, she will continue on hemodialysis by nephrology. The patient was advised to call immediately if she has any concerning symptoms in the interval. The patient voices understanding of current disease status and treatment options and is in agreement with the current care plan.  All questions were answered. The patient knows to call the clinic with any problems, questions or concerns. We can certainly see the patient much sooner if necessary.  The total time spent in the appointment was 35 minutes.  Disclaimer: This note was dictated with voice recognition software. Similar sounding words can inadvertently be transcribed and may not be corrected upon review.

## 2019-12-07 ENCOUNTER — Encounter: Payer: Self-pay | Admitting: Primary Care

## 2019-12-07 ENCOUNTER — Telehealth: Payer: Self-pay | Admitting: Primary Care

## 2019-12-07 ENCOUNTER — Ambulatory Visit (INDEPENDENT_AMBULATORY_CARE_PROVIDER_SITE_OTHER): Payer: Medicare Other | Admitting: Primary Care

## 2019-12-07 DIAGNOSIS — J449 Chronic obstructive pulmonary disease, unspecified: Secondary | ICD-10-CM

## 2019-12-07 DIAGNOSIS — C3482 Malignant neoplasm of overlapping sites of left bronchus and lung: Secondary | ICD-10-CM

## 2019-12-07 NOTE — Patient Instructions (Signed)
Left upper lobe lung nodule:  - Following with Dr. Julien Nordmann, she will be re-presenting to tumor board tomorrow 12/08/19 to discuss other potential options for bx lung tissue vs. radiotherapy treatment without bx  COPD: - Stable; no current exacerbation - Continue Trelegy Ellipta 100 one puff daily in the morning (rinse mouth after use) - Advised patient use albuterol nebulizer scheduled twice daily  Follow Up Instructions:  - 1 month with our office for follow-up LUL lung nodule and support ongoing care with Oncology

## 2019-12-07 NOTE — Progress Notes (Signed)
Reviewed and agree with assessment/plan.   Chesley Mires, MD [facility] Pulmonary/Critical Care 12/07/2019, 1:39 PM Pager:  [phone]

## 2019-12-07 NOTE — Telephone Encounter (Signed)
----- Message from Chesley Mires, MD sent at 11/21/2019  7:04 AM EDT ----- Regarding: RE: PET positive LUL nodule Just got in to town from vacation and catching up on message thread.  [name] would be very high risk for complications from anesthesia and pneumothorax if transbronchial biopsy was attempted.  If it is possible to confirm diagnosis and initiate therapy based on T-spine biopsy, then that would be best approach.  Thanks.  Vineet  ----- Message ----- From: Martyn Ehrich, NP Sent: 11/18/2019   5:31 PM EDT To: Chesley Mires, MD, Curt Bears, MD, # Subject: RE: PET positive LUL nodule                    Thank you both. Appreciate you help - Natasha [name] ----- Message ----- From: Curt Bears, MD Sent: 11/18/2019   3:06 PM EDT To: Chesley Mires, MD, Garner Nash, DO, # Subject: RE: PET positive LUL nodule                    I did order CT-guided core biopsy of the T10 bone lesion.  Thank you. ----- Message ----- From: Garner Nash, DO Sent: 11/18/2019   2:02 PM EDT To: Chesley Mires, MD, Curt Bears, MD, # Subject: RE: PET positive LUL nodule                    Ive not met her but FEV1 0.53 L which is very severe. High risk for ending up on vent after general anesthesia Thanks Brad ----- Message ----- From: Curt Bears, MD Sent: 11/17/2019   8:25 PM EDT To: Chesley Mires, MD, Garner Nash, DO, # Subject: RE: PET positive LUL nodule                    Hi All If she is high risk for Bronch/EBUS, we can try CT biopsy of one of the bone lesions and if positive for Adenocarcinoma, we can order blood for molecular study. Thank you  Julien Nordmann ----- Message ----- From: Heilingoetter, Tobe Sos, PA-C Sent: 11/17/2019   4:38 PM EDT To: Curt Bears, MD Subject: FW: PET positive LUL nodule                    What do you think? We saw her earlier this week. ----- Message ----- From: Garner Nash, DO Sent: 11/17/2019   2:11 PM EDT To: Chesley Mires, MD,  Martyn Ehrich, NP, # Subject: RE: PET positive LUL nodule                    I guess we need to talk with oncology?  Natasha [name], any thoughts on how to proceed?  Brad  ----- Message ----- From: Martyn Ehrich, NP Sent: 11/17/2019   1:17 PM EDT To: Chesley Mires, MD, Garner Nash, DO Subject: RE: PET positive LUL nodule                    No it does not look like she does have any soft tissue options.  She is on HD MWF and she is unsure if she would be able to tolerate chemotherapy.  She is nervous to go through with bronchoscopy because her brother passed away shortly after undergoing bx himself for a lung biopsy.   ----- Message ----- From: Garner Nash, DO Sent: 11/17/2019   1:11 PM EDT To: Chesley Mires, MD, Ree Shay  Volanda Napoleon, NP Subject: RE: PET positive LUL nodule                    Natasha [name],   Is there any soft tissue options for biopsy besides the lung/mediastinum? Liver. etc.  Oncology needs a soft tissue biopsy for molecular. Sounds like she has bad lung disease. If mediastinum is her only option then we could consider but risk would be higher for vent support after with severe COPD. Unfortunately, they can't treat with out a dx. Does she want treatment?   Thanks,  Natasha [name] ----- Message ----- From: Martyn Ehrich, NP Sent: 11/17/2019  12:45 PM EDT To: Chesley Mires, MD, Garner Nash, DO Subject: PET positive LUL nodule                        This is a patient of Dr. Halford Chessman PET positive LUL nodule SUV 16 with left hilar/suprahilar nodal metastases and metastatic osseous lesions to T10/T11. Suspicious of stage IV lung cancer. She saw oncology two days ago and they ordered MRI brain for complete staging and wanted her to see [place] for consideration for bx.   She came in today to discuss bronchoscopy for tissue sampling to confirm diagnosis. She has severe COPD and chronic respiratory failure on 3L oxygen continuous. She had PFTs in November 2020 showed FEV1 0.57 (35%),  ratio 37.  She appears ok today, frail/thin woman. No acute distress. Lungs were clear on exam. I do not think she would be a great candidate for bronchoscopy but can you look at the image to see what you think.    -Eustaquio Maize

## 2019-12-07 NOTE — Progress Notes (Signed)
Virtual Visit via Telephone Note  I connected with Natasha [name] on 12/07/19 at 10:00 AM EDT by telephone and verified that I am speaking with the correct person using two identifiers.  Location: Patient: Home Provider: Office   I discussed the limitations, risks, security and privacy concerns of performing an evaluation and management service by telephone and the availability of in person appointments. I also discussed with the patient that there may be a patient responsible charge related to this service. The patient expressed understanding and agreed to proceed.   History of Present Illness:  74 year old female, former smoker quit in 2003.  Past medical history significant for malignant neoplasm left lung, COPD/emphysema, hx COVID-19, CHF, left leg DVT/PE end stage renal failure on HD.  Patient of Dr. Halford Chessman, last seen on 10/11/19 for community-acquired pneumonia.  In April completed antibiotic.  She was on higher dose of prednisone as well.   Maintained on Trelegy Ellipta, prednisone 5 mg daily.    Repeat chest x-ray on 10/11/2019 showed nodular opacities left upper lobe and left perihilar region.  Given rapid change from 2 months prior question multifocal pneumonia.  She was ordered for CT chest on 10/18/2019 which showed advanced emphysema, bronchial wall thickening as well as multiple pulmonary nodules right lower lobe.  Ultimately then had PET scan 11/01/2019 which showed a left upper lobe mass measuring 2.3 x 1.9 cm with an SUV of 16.2.  She was [facility].   She was seen by oncology on 11/15/2019 suspicious for stage IV lung cancer. She presented with a left upper lobe nodule, left hilar/suprahilar nodal metastases, and metastatic osseous lesions to T10/T11.  She was diagnosed in June 2021.  Pending tissue confirmation.  She was ordered for MRI to complete staging work-up.  Previous [facility] pulmonary encounter: 11/17/2019- FU for LUL nodule Presents today to discuss bronchoscopy for tissue  sampling to confirm diagnosis. Her breathing is ok when she is wearing oxygen. She is currently on 3L pulsed oxygen. She can go 2-4 hours without her oxygen. She experience dyspnea with moderate exertion. She is on hemodialysis MWF for end stage renal disease. She does have intermittent nausea. She has a hx chron's. Denies chest pain, hemoptysis. MRI brain scheduled for 12/01/19. PFTs in November 2020 showed FEV1 0.57 (35%), ratio 37.    12/07/2019-interim history Patient contacted today for 1 week virtual telephone visit/malignant neoplasm left lung.  During last visit we discussed bronchoscopy for tissue sampling, we did not feel that she would be a great candidate for this procedure due to severe COPD and underlying emphysema.  Her FEV1 is 35% and she is on 3 L of oxygen for chronic respiratory failure.  This was relayed to Dr. Earlie Server and Dr. Valeta Harms, the decision was made to forego bronchoscopy and schedule her for CT-guided biopsy of thoracic metastatic ostial lesion.  Since last visit she was admitted to the hospital for epigastric abdominal pain, nausea/vomiting/diarrhea.  She was discharged on 20 mg of prednisone once daily for a week then decrease to 10 mg once daily for an additional week then return to 5 mg daily.  GI panel came back negative.  CT scan of abdomen with contrast did not reveal any significant findings.  GI symptoms resolved.  She underwent hemodialysis on 11/19/2019.  Her midodrine was increased from 5 mg 3 times a day to 10 mg 3 times a day.  She will follow-up with hematology oncology team at discharge as there are concerns for possible metastatic lung  cancer.  She met with Dr. Earlie Server yesterday regarding suspicious stage IIIa/IV lung cancer.  She underwent CT-guided core biopsy of 10 T inferior endplate lesion by interventional radiology and the final pathology showed atypical lymphoid infiltrates with no evidence of carcinoma.  She had an MRI of her brain that was unremarkable for  any disease metastasis to the brain.  Unfortunately biopsy of endplate lesion was not conclusive.  Plan is to discuss her case again at the weekly thoracic conference tomorrow July 15 to see the best option for biopsy from one of the lung lesions.  Other options would be radiotherapy with out a biopsy but in the presence of hilar lymphadenopathy she probably will need hypofractionated treatment rather than SBRT.  She really does not want to persue biopsy by way of bronchoscopy. Feels her breathing is not good enough and states that she is afraid to undergo procedure as her brother died after complication from similar procedure. She reports struggling with her breathing earlier this week, she took a breathing treatment which helped. Oxygen concentrator was not working properly and would not go past 1L/min, states that her filter needed cleaning. It is now working and she is using 3L continuous. Denies fever, chills, sweats, cough, hemoptysis.    Observations/Objective:  - Sounds well; no overt shortness of breath, wheezing or cough   Imaging: PET scan on 11/01/2019 and it showed 2.3 x 1.9 cm central left upper lobe mass with SUV max of 16.2 compatible with primary bronchogenic neoplasm.  There was 0.8 x 1.3 cm irregular satellite nodule in the left upper lobe that may be infectious or inflammatory in origin.  There was also a 0.8 x 1.7 cm irregular nodular opacity in the posterior right lower lobe again suspicious for inflammatory process.  The scan showed focal hypermetabolism in the left suprahilar region with SUV max of 13.4 and additional hypermetabolism in the left hilar region with SUV max of 11.2.  There was also evidence of osseous metastasis at the T10 vertebral body with SUV max of 14.7.  And osseous metastasis at the T11 vertebral body with SUV max of 9.1.   Assessment and Plan:  Malignant neoplasm left lung: - Patient deemed high risk for bronchoscopy, underwent T10 endplate lesion biopsy  with IR which was inconclusive  - Following with Dr. Julien Nordmann, she will be re-presenting to tumor board tomorrow 12/08/19 to discuss other potential options for bx lung tissue vs. radiotherapy treatment without bx  COPD: - Stable; no current exacerbation - Continue Trelegy Ellipta 100 one puff daily in the morning (rinse mouth after use) - Continue Prednisone 78m daily  - Advised patient use albuterol nebulizer scheduled twice daily  Follow Up Instructions:  - 1 month with our office for follow-up LUL lung nodule and support ongoing care with Oncology    I discussed the assessment and treatment plan with the patient. The patient was provided an opportunity to ask questions and all were answered. The patient agreed with the plan and demonstrated an understanding of the instructions.   The patient was advised to call back or seek an in-person evaluation if the symptoms worsen or if the condition fails to improve as anticipated.  I provided 22 minutes of non-face-to-face time during this encounter.   EMartyn Ehrich NP

## 2019-12-08 ENCOUNTER — Other Ambulatory Visit: Payer: Self-pay | Admitting: Emergency Medicine

## 2019-12-08 ENCOUNTER — Other Ambulatory Visit: Payer: Self-pay | Admitting: *Deleted

## 2019-12-08 DIAGNOSIS — R918 Other nonspecific abnormal finding of lung field: Secondary | ICD-10-CM

## 2019-12-08 NOTE — Progress Notes (Signed)
Working on setting up ENB to eval pulm nodules. Goal 7/27 or 8/3

## 2019-12-08 NOTE — Progress Notes (Signed)
The proposed treatment discussed in cancer conference 12/08/19 is for discussion purpose only and is not a binding recommendation. The patient was not physically examined nor present for their treatment options.  Therefore, final treatment plans cannot be decided.

## 2019-12-09 ENCOUNTER — Telehealth: Payer: Self-pay | Admitting: *Deleted

## 2019-12-09 NOTE — Telephone Encounter (Signed)
I spoke to Ms. Leighty today.  She states she does not want to have bronchoscopy.  I listened as she explained. I updated Dr. Julien Nordmann and Dr. Lamonte Sakai on patients wishes.

## 2019-12-09 NOTE — Telephone Encounter (Signed)
I called patient to check on her.  She has an appt with Dr. Lamonte Sakai for bronchoscopy and I wanted to make sure she did not have any questions.  I was unable to reach but did leave vm message with my name and phone number to call if needed.

## 2019-12-12 ENCOUNTER — Telehealth: Payer: Self-pay | Admitting: *Deleted

## 2019-12-12 NOTE — Telephone Encounter (Signed)
I received a message from both Dr. Julien Nordmann and Dr. Lamonte Sakai.  If the patient is not interested in getting dx at this time then she needs a follow up with Dr. Julien Nordmann. I called patient to update but was unable to reach her. I did leave vm message with my name and phone number to call.

## 2019-12-17 ENCOUNTER — Other Ambulatory Visit (HOSPITAL_COMMUNITY): Payer: Medicare Other

## 2019-12-20 ENCOUNTER — Ambulatory Visit (HOSPITAL_COMMUNITY): Admit: 2019-12-20 | Payer: Medicare Other | Admitting: Emergency Medicine

## 2019-12-20 ENCOUNTER — Telehealth: Payer: Self-pay | Admitting: *Deleted

## 2019-12-20 ENCOUNTER — Encounter (HOSPITAL_COMMUNITY): Payer: Self-pay

## 2019-12-20 SURGERY — VIDEO BRONCHOSCOPY WITH ENDOBRONCHIAL NAVIGATION
Anesthesia: General

## 2019-12-20 NOTE — Telephone Encounter (Signed)
I called Ms. Finstad to follow up with her.  I was unable to reach her but did leave vm message with my name and phone number to call.

## 2019-12-21 ENCOUNTER — Telehealth: Payer: Self-pay | Admitting: *Deleted

## 2019-12-21 ENCOUNTER — Telehealth: Payer: Self-pay | Admitting: Internal Medicine

## 2019-12-21 DIAGNOSIS — C7802 Secondary malignant neoplasm of left lung: Secondary | ICD-10-CM

## 2019-12-21 NOTE — Telephone Encounter (Signed)
Scheduled appointments per 7/28 scheduling message. Patient is aware of appointments dates and times.

## 2019-12-21 NOTE — Telephone Encounter (Signed)
Natasha [name] called me today.  I updated her that Dr. Julien Nordmann and Dr. Lamonte Sakai are ok that she did not want to get tissue dx at this time but that she needs to come back in 3 months for a follow up.  She verbalized understanding.  I told her that scheduling will call her with an appt.  She was thankful for not being upset that she did not want to get a bx right now.  I reassured her that we are not upset and that we want what she is comfortable with.  I will updated scheduling and complete ct order per Dr. Julien Nordmann.

## 2019-12-27 ENCOUNTER — Other Ambulatory Visit: Payer: Self-pay

## 2019-12-27 ENCOUNTER — Encounter: Payer: Self-pay | Admitting: Cardiovascular Disease

## 2019-12-27 ENCOUNTER — Ambulatory Visit (INDEPENDENT_AMBULATORY_CARE_PROVIDER_SITE_OTHER): Payer: Medicare Other | Admitting: Cardiovascular Disease

## 2019-12-27 VITALS — BP 122/66 | HR 90 | Ht 64.0 in | Wt 112.6 lb

## 2019-12-27 DIAGNOSIS — I2699 Other pulmonary embolism without acute cor pulmonale: Secondary | ICD-10-CM

## 2019-12-27 DIAGNOSIS — I471 Supraventricular tachycardia, unspecified: Secondary | ICD-10-CM | POA: Insufficient documentation

## 2019-12-27 DIAGNOSIS — I5032 Chronic diastolic (congestive) heart failure: Secondary | ICD-10-CM | POA: Diagnosis not present

## 2019-12-27 NOTE — Patient Instructions (Signed)
Medication Instructions:  Your physician recommends that you continue on your current medications as directed. Please refer to the Current Medication list given to you today.  *If you need a refill on your cardiac medications before your next appointment, please call your pharmacy.   Follow-Up: At [facility], you and your health needs are our priority.  As part of our continuing mission to provide you with exceptional heart care, we have created designated Provider Care Teams.  These Care Teams include your primary Cardiologist (physician) and Advanced Practice Providers (APPs -  Physician Assistants and Nurse Practitioners) who all work together to provide you with the care you need, when you need it.  We recommend signing up for the patient portal called "MyChart".  Sign up information is provided on this After Visit Summary.  MyChart is used to connect with patients for Virtual Visits (Telemedicine).  Patients are able to view lab/test results, encounter notes, upcoming appointments, etc.  Non-urgent messages can be sent to your provider as well.   To learn more about what you can do with MyChart, go to [email or web address].    Your next appointment:   6 month(s)  The format for your next appointment:   In Person  Provider:   Richardson Dopp, PA-C or Robbie Lis, PA-C

## 2019-12-27 NOTE — Progress Notes (Signed)
Cardiology Office Note:    Date:  12/27/2019   ID:  Natasha [name], Natasha [name] 22-May-1946, MRN [id number]  PCP:  Natasha Cha, MD  Cardiologist:   Natasha [name]  Electrophysiologist:  None   Referring MD: Natasha [name],*   No chief complaint on file.  Problem list 1.  Chronic diastolic congestive heart failure 2.  COPD 3.  Chronic kidney disease 4.  Aortic insufficiency 5.  Essential hypertension 6.  Chronic anemia 7.  History of DVT/pulmonary embolus.  History of Present Illness:    Natasha [name] is a 74 y.o. female with a hx of  Chronic systolic CHF Was admitted with COVID several months ago  Is now on dialysis  Is now on home O2.   3 liters per minute.   Still eats some salt.    She says that her favorite thing to eat are salty cheese puffs and she eats many of them every day. ( a whole famly sized bag after she comes home from dialysis )   Her LV fuction shows hyperdynamic left ventricular systolic function with an ejection fraction of greater than 65%.  She does have grade 1 diastolic dysfunction.  She has moderate pulmonary hypertension with an estimated PA pressure of 44 mmHg.  November 16, 2019: Natasha [name] is seen today for follow-up of her chronic diastolic congestive heart failure and hypertension.  She also has chronic kidney disease and is on dialysis.. She is feeling better.   Breathing is better.  She was seen in the [facility] emergency room on May 25 for an episode of supraventricular tachycardia.  Her heart rate was 177.  She was treated with metoprolol 2.5 mg IV.  She then received IV adenosine with conversion back to sinus rhythm. Will start metoprolol 25 mg PO BID   She had a lung CT scan and later a PET scan .  cnocerning for cancer She will have a broncholsopy   December 27, 2019:  Natasha [name] is seen back today for follow-up of her chronic diastolic congestive heart failure and episodes of supraventricular tachycardia. She was admitted to  the hospital in June, 2021 with an episode of supraventricular tachycardia and hypotension. She received IV metoprolol which did not convert her.  She received IV adenosine which converted back to normal sinus rhythm.  She had a second episode of SVT that was converted with IV diltiazem. She was started on diltiazem 30 mg every 8 hours.  It was thought that she might tolerate diltiazem better than beta-blocker since she has reactive airways disease.  No further episodes of SVT since starting on the Dilt 30 mg Q8.   She has a lung mass.   CT guided lung bx was inconclusive. She needs to have a bronch but she has not wanted to do the bronch yet  Past Medical History:  Diagnosis Date  . Anal fistula   . Anemia in chronic kidney disease (CKD)   . Aortic insufficiency    Echo 3/18: mod conc LVH, EF 60-65, no RWMA, Gr 1 DD, mod AI, mild MR, normal RVSF, Trivial TR  . Arthritis   . Borderline hypertension   . Bulging disc    L3-L4  . Chronic diarrhea    due to crohn's  . CKD (chronic kidney disease), stage IV (Natasha [name])    MWF- [address]  . Crohn's disease ([place])    chronic ileitis  . Dyspnea    with exertion  . Emphysema/COPD ([place])   . GERD (gastroesophageal  reflux disease)    denies  . History of blood transfusion   . History of glaucoma   . History of kidney stones   . History of pancreatitis    2008--  mercaptopurine  . History of small bowel obstruction    12-03-2010  due to crohn's ileitis  . Hypertension    no longer on medications  . Hypothyroidism, postsurgical    multinodule w/ hurthle cells  . Osteoporosis   . PAF (paroxysmal atrial fibrillation) ([place])   . Perianal Crohn's disease ([place])   . Peripheral vascular disease ([place])    blood clot behind knee left leg  . Polyarthralgia   . Seizures ([place])    03/2017  . Wears partial dentures    upper    Past Surgical History:  Procedure Laterality Date  . ABDOMINAL HYSTERECTOMY  1990   and  Appendectomy  . AV FISTULA  PLACEMENT Left 12/08/2017   Procedure: INSERTION OF ARTERIOVENOUS (AV) GRAFT WITH ARTEGRAFT TO LEFT UPPER ARM;  Surgeon: Conrad Ada, MD;  Location: [facility];  Service: Vascular;  Laterality: Left;  . BASCILIC VEIN TRANSPOSITION Left 01/09/2017   Procedure: LEFT 1ST STAGE BRACHIAL VEIN TRANSPOSITION;  Surgeon: Conrad Crofton, MD;  Location: [facility];  Service: Vascular;  Laterality: Left;  . BASCILIC VEIN TRANSPOSITION Left 06/16/2017   Procedure: Second Stage BASILIC VEIN TRANSPOSITION  LEFT ARM;  Surgeon: Conrad [place], MD;  Location: [facility];  Service: Vascular;  Laterality: Left;  . BIOPSY  03/09/2018   Procedure: BIOPSY;  Surgeon: Wilford Corner, MD;  Location: [facility];  Service: Endoscopy;;  . CHOLECYSTECTOMY    . COLON RESECTION  x3 --  1978,  1987, 1989   ILEAL RESECTION x2/   [place]  . COLONOSCOPY WITH PROPOFOL N/A 09/25/2014   Procedure: COLONOSCOPY WITH PROPOFOL;  Surgeon: Garlan Fair, MD;  [facility];  Service: Endoscopy;  Laterality: N/A;  . COLONOSCOPY WITH PROPOFOL N/A 03/09/2018   Procedure: COLONOSCOPY WITH PROPOFOL;  Surgeon: Wilford Corner, MD;  [facility];  Service: Endoscopy;  Laterality: N/A;  . CYSTOSCOPY W/ URETERAL STENT PLACEMENT Right 11/19/2016   Procedure: CYSTOSCOPY WITH RIGHT RETROGRADE PYELOGRAM/ URETEROSCOPY WITH LASER AND RIGHT URETERAL STENT PLACEMENT;  Surgeon: Ardis Hughs, MD;  [facility];  Service: Urology;  Laterality: Right;  . ESOPHAGOGASTRODUODENOSCOPY  03/04/2012   Procedure: ESOPHAGOGASTRODUODENOSCOPY (EGD);  Surgeon: Garlan Fair, MD;  Location: [facility];  Service: Endoscopy;  Laterality: N/A;  . EXAMINATION UNDER ANESTHESIA N/A 09/12/2014   Procedure: EXAM UNDER ANESTHESIA;  Surgeon: Rolm Bookbinder, MD;  Location: [facility];  Service: General;  Laterality: N/A;  . EYE SURGERY     laser  . FLEXIBLE SIGMOIDOSCOPY  03/04/2012   Procedure: FLEXIBLE SIGMOIDOSCOPY;  Surgeon: Garlan Fair,  MD;  [facility];  Service: Endoscopy;  Laterality: N/A;  . GLAUCOMA SURGERY Bilateral   . HOLMIUM LASER APPLICATION Right 9/56/3875   Procedure: HOLMIUM LASER APPLICATION;  Surgeon: Ardis Hughs, MD;  [facility];  Service: Urology;  Laterality: Right;  . INCISION AND DRAINAGE PERIRECTAL ABSCESS N/A 09/12/2014   Procedure: IRRIGATION AND DEBRIDEMENT PERIRECTAL ABSCESS;  Surgeon: Rolm Bookbinder, MD;  Location: [facility];  Service: General;  Laterality: N/A;  . IR FLUORO GUIDE CV LINE RIGHT  03/20/2017  . IR US GUIDE VASC ACCESS RIGHT  03/20/2017  . NEGATIVE SLEEP STUDY  2008  . PLACEMENT OF SETON N/A 12/01/2014   Procedure: PLACEMENT OF SETON;  Surgeon:  [name], MD;  Location: [facility];  Service: General;  Laterality: N/A;  . POLYPECTOMY  03/09/2018   Procedure: POLYPECTOMY;  Surgeon: Wilford Corner, MD;  [facility];  Service: Endoscopy;;  . RECTAL EXAM UNDER ANESTHESIA N/A 12/01/2014   Procedure: RECTAL EXAM UNDER ANESTHESIA;  Surgeon: [name], MD;  Location: [facility];  Service: General;  Laterality: N/A;  . TOTAL THYROIDECTOMY  10-13-2003  . TRANSTHORACIC ECHOCARDIOGRAM  07-11-2013   mild LVH,  ef 55%,  moderate AR,  mild MR and TR,  trivial PR  . TUBAL LIGATION      Current Medications: Current Meds  Medication Sig  . acetaminophen (TYLENOL) 500 MG tablet Take 1,000 mg by mouth every 6 (six) hours as needed for mild pain.   . Adalimumab (HUMIRA) 40 MG/0.8ML PSKT Inject 40 mg into the skin every 14 (fourteen) days.   [name] albuterol (VENTOLIN HFA) 108 (90 Base) MCG/ACT inhaler INHALE 2 PUFFS BY MOUTH EVERY 6 HOURS IF NEEDED FOR WHEEZING OR SHORTNESS OF BREATH  . alendronate (FOSAMAX) 70 MG tablet Take 70 mg by mouth every Monday.   . calcitRIOL (ROCALTROL) 0.25 MCG capsule Take 0.25 mcg by mouth every Monday, Wednesday, and Friday with hemodialysis.  [name] calcium acetate (PHOSLO) 667 MG capsule Take 1 capsule (667  mg total) by mouth 3 (three) times daily with meals.  . calcium-vitamin D (OSCAL WITH D) 500-200 MG-UNIT TABS tablet Take 1 tablet by mouth as needed (with dialysis).   . cyanocobalamin (,VITAMIN B-12,) 1000 MCG/ML injection Inject 1,000 mcg into the muscle every 30 (thirty) days.   [name] diltiazem (CARDIZEM) 30 MG tablet Take 1 tablet (30 mg total) by mouth every 8 (eight) hours.  [name] ELIQUIS 5 MG TABS tablet Take 2.5 mg by mouth 2 (two) times daily. Takes 1/2 tablet (2.66m) bid  . fluticasone (FLONASE) 50 MCG/ACT nasal spray Place 1-2 sprays into both nostrils daily.  . Fluticasone-Umeclidin-Vilant (TRELEGY ELLIPTA) 100-62.5-25 MCG/INH AEPB Inhale 1 puff into the lungs daily.  .Marland Kitchenipratropium-albuterol (DUONEB) 0.5-2.5 (3) MG/3ML SOLN Take 3 mLs by nebulization every 6 (six) hours as needed.  .Marland Kitchenlevothyroxine (SYNTHROID, LEVOTHROID) 125 MCG tablet Take 125 mcg by mouth daily before breakfast.   . lidocaine-prilocaine (EMLA) cream Apply 1 application topically every Monday, Wednesday, and Friday.   . loperamide (IMODIUM) 2 MG capsule Take 2 capsules (4 mg total) by mouth as needed for diarrhea or loose stools.  . Methoxy PEG-Epoetin Beta (MIRCERA IJ) Inject 150 mg as directed every 14 (fourteen) days.  . midodrine (PROAMATINE) 10 MG tablet Take 1 tablet (10 mg total) by mouth 3 (three) times daily with meals.  . nitroGLYCERIN (NITROSTAT) 0.4 MG SL tablet Place 1 tablet (0.4 mg total) under the tongue every 5 (five) minutes as needed for chest pain.  .Marland Kitchenondansetron (ZOFRAN ODT) 4 MG disintegrating tablet Take 1 tablet (4 mg total) by mouth every 8 (eight) hours as needed for nausea or vomiting.  . OXYGEN Inhale 2-3 L into the lungs See admin instructions. Use every night and during the day as needed for shortness of breath  . predniSONE (DELTASONE) 5 MG tablet Take 5 mg by mouth daily with breakfast.  . Respiratory Therapy Supplies (NEBULIZER) DEVI 1 Device by Does not apply route as needed.     Allergies:    Mercaptopurine and Remicade [infliximab]   Social History   Socioeconomic History  . Marital status: Married    Spouse name: WTheodoro Doing . Number  of children: 3  . Years of education: 52  . Highest education level: Not on file  Occupational History  . Occupation: Retired    Fish farm manager: RETIRED  Tobacco Use  . Smoking status: Former Smoker    Packs/day: 1.00    Years: 35.00    Pack years: 35.00    Types: Cigarettes    Start date: 02/22/1967    Quit date: 11/27/2017    Years since quitting: 2.0  . Smokeless tobacco: Never Used  Vaping Use  . Vaping Use: Never used  Substance and Sexual Activity  . Alcohol use: No  . Drug use: No  . Sexual activity: Never  Other Topics Concern  . Not on file  Social History Narrative   Patient is married [name]) and lives at home with her husband.   Patient has three children.   Patient is retired on disability.   Patient drinks two cups of caffeine daily.   Patient is right-handed.         Social Determinants of Health   Financial Resource Strain:   . Difficulty of Paying Living Expenses:   Food Insecurity:   . Worried About Charity fundraiser in the Last Year:   . Arboriculturist in the Last Year:   Transportation Needs:   . Film/video editor (Medical):   [name] Lack of Transportation (Non-Medical):   Physical Activity:   . Days of Exercise per Week:   . Minutes of Exercise per Session:   Stress:   . Feeling of Stress :   Social Connections:   . Frequency of Communication with Friends and Family:   . Frequency of Social Gatherings with Friends and Family:   . Attends Religious Services:   . Active Member of Clubs or Organizations:   . Attends Archivist Meetings:   [name] Marital Status:      Family History: The patient's family history includes COPD in her mother; Colon cancer in her brother; Diabetes in her brother; Diverticulitis in her mother; Heart attack in her brother, father, and mother; Heart disease in her  father and mother; Hypertension in her father and mother; Thyroid disease in her brother.  ROS:   Please see the history of present illness.     All other systems reviewed and are negative.  EKGs/Labs/Other Studies Reviewed:    The following studies were reviewed today:     Recent Labs: 04/16/2019: Magnesium 1.6 11/18/2019: ALT 11; TSH 1.499 11/29/2019: BUN 31; Creatinine, Ser 6.68; Hemoglobin 11.2; Platelets 140; Potassium 3.3; Sodium 141  Recent Lipid Panel No results found for: CHOL, TRIG, HDL, CHOLHDL, VLDL, LDLCALC, LDLDIRECT  Physical Exam:    Physical Exam: Blood pressure 122/66, pulse 90, height 5' 4"  (1.626 m), weight 112 lb 9.6 oz (51.1 kg), SpO2 100 %.  GEN: Chronically ill-appearing female.  She was examined in the wheelchair. HEENT: Normal NECK: No JVD; No carotid bruits LYMPHATICS: No lymphadenopathy CARDIAC: RRR , no murmurs, rubs, gallops RESPIRATORY:  Clear to auscultation without rales, wheezing or rhonchi  ABDOMEN: Soft, non-tender, non-distended MUSCULOSKELETAL:  No edema; No deformity  SKIN: Warm and dry NEUROLOGIC:  Alert and oriented x 3   EKG:   ASSESSMENT:    No diagnosis found. PLAN:    In order of problems listed above:  1. Chronic diastolic congestive heart failure: .  stable , cont meds.   2.  SVT:   - cont Diltiazem 30 mg Q8. Will refill  She seems to be  tolertating and it seems to be controlling her SVT .   3.  Hypertension:  BP is well controlled.   4.  Hx of pulmonary embolus : on eliquis .   stabel    Medication Adjustments/Labs and Tests Ordered: Current medicines are reviewed at length with the patient today.  Concerns regarding medicines are outlined above.  No orders of the defined types were placed in this encounter.  No orders of the defined types were placed in this encounter.   Patient Instructions  Medication Instructions:  Your physician recommends that you continue on your current medications as directed.  Please refer to the Current Medication list given to you today.  *If you need a refill on your cardiac medications before your next appointment, please call your pharmacy.   Follow-Up: At [facility], you and your health needs are our priority.  As part of our continuing mission to provide you with exceptional heart care, we have created designated Provider Care Teams.  These Care Teams include your primary Cardiologist (physician) and Advanced Practice Providers (APPs -  Physician Assistants and Nurse Practitioners) who all work together to provide you with the care you need, when you need it.  We recommend signing up for the patient portal called "MyChart".  Sign up information is provided on this After Visit Summary.  MyChart is used to connect with patients for Virtual Visits (Telemedicine).  Patients are able to view lab/test results, encounter notes, upcoming appointments, etc.  Non-urgent messages can be sent to your provider as well.   To learn more about what you can do with MyChart, go to [email or web address].    Your next appointment:   6 month(s)  The format for your next appointment:   In Person  Provider:   Richardson Dopp, PA-C or Robbie Lis, PA-C      Signed, Mertie Moores, MD  12/27/2019 3:47 PM    [place]

## 2020-01-17 ENCOUNTER — Telehealth: Payer: Self-pay | Admitting: Pulmonary Disease

## 2020-01-17 MED ORDER — TRELEGY ELLIPTA 100-62.5-25 MCG/INH IN AEPB: 1.0000 | INHALATION_SPRAY | Freq: Every day | RESPIRATORY_TRACT | 11 refills | Status: AC

## 2020-01-17 NOTE — Telephone Encounter (Signed)
Called and left VM for patient regarding Trelegy 100 samples.  Looks like we do have samples.  Trying to get further information regarding jump in price of patient's copay.  Requested a return call.

## 2020-01-17 NOTE — Telephone Encounter (Signed)
Called and spoke with patient, she states that she is in the donut hole and cannot afford her inhaler.  Samples put at the front ad [place] paperwork started for patient assistance.  Script printed for app to sign, will also ask app to sign application.  Patient will fill out her portion of application when she comes to get the inhalers.  Nothing further needed.

## 2020-01-19 ENCOUNTER — Telehealth: Payer: Self-pay | Admitting: Pulmonary Disease

## 2020-01-19 MED ORDER — PREDNISONE 10 MG PO TABS
ORAL_TABLET | ORAL | 0 refills | Status: DC
Start: 2020-01-19 — End: 2020-02-03

## 2020-01-19 NOTE — Telephone Encounter (Addendum)
Called and spoke to pt. Pt states she could not afford her trelegy and has been off it x 2 weeks. Pt now has samples and started back on Trelegy this morning.  Pt c/o increase in SOB, wheezing, chest tightness x 1 week. Pt has hemodialysis thrice weekly.Pt currently on 3lpm and spo2 is mid 90s. Pt denies f/c/s, covid contacts, swelling, cough. Pt is requesting recs before coming in for an appt.   Dr. Halford Chessman please advise. Thanks.

## 2020-01-19 NOTE — Telephone Encounter (Signed)
Called and spoke to pt's husband. Informed him of the recs per VS. New order placed so she would not run out of her 59m tabs. Pts husband verbalized understanding and denied any further questions or concerns at this time.

## 2020-01-19 NOTE — Telephone Encounter (Signed)
Please have her increase her prednisone to 20 mg daily for 3 days, then 15 mg daily for 3 days, then 10 mg daily for 3 days, then resume her 5 mg daily dose of prednisone.

## 2020-01-24 ENCOUNTER — Other Ambulatory Visit: Payer: Self-pay

## 2020-01-24 MED ORDER — MIDODRINE HCL 10 MG PO TABS: 10.0000 mg | ORAL_TABLET | Freq: Three times a day (TID) | ORAL | 11 refills | Status: DC

## 2020-01-24 MED ORDER — MIDODRINE HCL 10 MG PO TABS: 10.0000 mg | ORAL_TABLET | Freq: Three times a day (TID) | ORAL | 11 refills | Status: AC

## 2020-01-24 MED ORDER — DILTIAZEM HCL 30 MG PO TABS: 30.0000 mg | ORAL_TABLET | Freq: Three times a day (TID) | ORAL | 11 refills | Status: DC

## 2020-01-24 NOTE — Telephone Encounter (Signed)
Pt's medications were sent to pt's pharmacy as requested. Confirmation received.  

## 2020-01-30 ENCOUNTER — Other Ambulatory Visit: Payer: Self-pay | Admitting: Physician Assistant

## 2020-01-30 ENCOUNTER — Emergency Department (HOSPITAL_COMMUNITY): Payer: Medicare Other

## 2020-01-30 ENCOUNTER — Encounter (HOSPITAL_COMMUNITY): Payer: Self-pay

## 2020-01-30 ENCOUNTER — Inpatient Hospital Stay (HOSPITAL_COMMUNITY)
Admission: EM | Admit: 2020-01-30 | Discharge: 2020-02-03 | DRG: 377 | Disposition: A | Discharge status: Home / Self Care | Payer: Medicare Other | Attending: Internal Medicine | Admitting: Internal Medicine

## 2020-01-30 DIAGNOSIS — Z7951 Long term (current) use of inhaled steroids: Secondary | ICD-10-CM

## 2020-01-30 DIAGNOSIS — Z681 Body mass index (BMI) 19 or less, adult: Secondary | ICD-10-CM | POA: Diagnosis not present

## 2020-01-30 DIAGNOSIS — I480 Paroxysmal atrial fibrillation: Secondary | ICD-10-CM | POA: Diagnosis present

## 2020-01-30 DIAGNOSIS — K5730 Diverticulosis of large intestine without perforation or abscess without bleeding: Secondary | ICD-10-CM | POA: Diagnosis present

## 2020-01-30 DIAGNOSIS — K219 Gastro-esophageal reflux disease without esophagitis: Secondary | ICD-10-CM | POA: Diagnosis present

## 2020-01-30 DIAGNOSIS — C7802 Secondary malignant neoplasm of left lung: Secondary | ICD-10-CM | POA: Diagnosis present

## 2020-01-30 DIAGNOSIS — K644 Residual hemorrhoidal skin tags: Secondary | ICD-10-CM | POA: Diagnosis present

## 2020-01-30 DIAGNOSIS — E039 Hypothyroidism, unspecified: Secondary | ICD-10-CM | POA: Diagnosis not present

## 2020-01-30 DIAGNOSIS — Z86718 Personal history of other venous thrombosis and embolism: Secondary | ICD-10-CM

## 2020-01-30 DIAGNOSIS — Z833 Family history of diabetes mellitus: Secondary | ICD-10-CM

## 2020-01-30 DIAGNOSIS — I5032 Chronic diastolic (congestive) heart failure: Secondary | ICD-10-CM | POA: Diagnosis present

## 2020-01-30 DIAGNOSIS — M810 Age-related osteoporosis without current pathological fracture: Secondary | ICD-10-CM | POA: Diagnosis present

## 2020-01-30 DIAGNOSIS — I739 Peripheral vascular disease, unspecified: Secondary | ICD-10-CM | POA: Diagnosis present

## 2020-01-30 DIAGNOSIS — N186 End stage renal disease: Secondary | ICD-10-CM | POA: Diagnosis present

## 2020-01-30 DIAGNOSIS — K314 Gastric diverticulum: Secondary | ICD-10-CM | POA: Diagnosis present

## 2020-01-30 DIAGNOSIS — N2581 Secondary hyperparathyroidism of renal origin: Secondary | ICD-10-CM | POA: Diagnosis present

## 2020-01-30 DIAGNOSIS — R0602 Shortness of breath: Secondary | ICD-10-CM

## 2020-01-30 DIAGNOSIS — Z20822 Contact with and (suspected) exposure to covid-19: Secondary | ICD-10-CM | POA: Diagnosis present

## 2020-01-30 DIAGNOSIS — Z8349 Family history of other endocrine, nutritional and metabolic diseases: Secondary | ICD-10-CM

## 2020-01-30 DIAGNOSIS — M1990 Unspecified osteoarthritis, unspecified site: Secondary | ICD-10-CM | POA: Diagnosis present

## 2020-01-30 DIAGNOSIS — Z7901 Long term (current) use of anticoagulants: Secondary | ICD-10-CM

## 2020-01-30 DIAGNOSIS — Z9981 Dependence on supplemental oxygen: Secondary | ICD-10-CM | POA: Diagnosis not present

## 2020-01-30 DIAGNOSIS — D62 Acute posthemorrhagic anemia: Secondary | ICD-10-CM | POA: Diagnosis present

## 2020-01-30 DIAGNOSIS — K648 Other hemorrhoids: Secondary | ICD-10-CM | POA: Diagnosis present

## 2020-01-30 DIAGNOSIS — R918 Other nonspecific abnormal finding of lung field: Secondary | ICD-10-CM | POA: Diagnosis present

## 2020-01-30 DIAGNOSIS — I132 Hypertensive heart and chronic kidney disease with heart failure and with stage 5 chronic kidney disease, or end stage renal disease: Secondary | ICD-10-CM | POA: Diagnosis present

## 2020-01-30 DIAGNOSIS — K922 Gastrointestinal hemorrhage, unspecified: Secondary | ICD-10-CM | POA: Diagnosis not present

## 2020-01-30 DIAGNOSIS — J9611 Chronic respiratory failure with hypoxia: Secondary | ICD-10-CM | POA: Diagnosis present

## 2020-01-30 DIAGNOSIS — Z800 Family history of malignant neoplasm of digestive organs: Secondary | ICD-10-CM

## 2020-01-30 DIAGNOSIS — K50811 Crohn's disease of both small and large intestine with rectal bleeding: Secondary | ICD-10-CM | POA: Diagnosis present

## 2020-01-30 DIAGNOSIS — Z79899 Other long term (current) drug therapy: Secondary | ICD-10-CM

## 2020-01-30 DIAGNOSIS — H409 Unspecified glaucoma: Secondary | ICD-10-CM | POA: Diagnosis present

## 2020-01-30 DIAGNOSIS — K635 Polyp of colon: Secondary | ICD-10-CM | POA: Diagnosis present

## 2020-01-30 DIAGNOSIS — K921 Melena: Secondary | ICD-10-CM | POA: Diagnosis present

## 2020-01-30 DIAGNOSIS — E440 Moderate protein-calorie malnutrition: Secondary | ICD-10-CM | POA: Diagnosis present

## 2020-01-30 DIAGNOSIS — K284 Chronic or unspecified gastrojejunal ulcer with hemorrhage: Principal | ICD-10-CM | POA: Diagnosis present

## 2020-01-30 DIAGNOSIS — J439 Emphysema, unspecified: Secondary | ICD-10-CM | POA: Diagnosis present

## 2020-01-30 DIAGNOSIS — Z7983 Long term (current) use of bisphosphonates: Secondary | ICD-10-CM

## 2020-01-30 DIAGNOSIS — Z8249 Family history of ischemic heart disease and other diseases of the circulatory system: Secondary | ICD-10-CM

## 2020-01-30 DIAGNOSIS — D649 Anemia, unspecified: Secondary | ICD-10-CM

## 2020-01-30 DIAGNOSIS — K5090 Crohn's disease, unspecified, without complications: Secondary | ICD-10-CM | POA: Diagnosis not present

## 2020-01-30 DIAGNOSIS — K603 Anal fistula: Secondary | ICD-10-CM | POA: Diagnosis present

## 2020-01-30 DIAGNOSIS — K2980 Duodenitis without bleeding: Secondary | ICD-10-CM | POA: Diagnosis present

## 2020-01-30 DIAGNOSIS — M898X9 Other specified disorders of bone, unspecified site: Secondary | ICD-10-CM | POA: Diagnosis present

## 2020-01-30 DIAGNOSIS — I351 Nonrheumatic aortic (valve) insufficiency: Secondary | ICD-10-CM | POA: Diagnosis present

## 2020-01-30 DIAGNOSIS — K2950 Unspecified chronic gastritis without bleeding: Secondary | ICD-10-CM | POA: Diagnosis present

## 2020-01-30 DIAGNOSIS — Z86711 Personal history of pulmonary embolism: Secondary | ICD-10-CM

## 2020-01-30 DIAGNOSIS — I2699 Other pulmonary embolism without acute cor pulmonale: Secondary | ICD-10-CM | POA: Diagnosis present

## 2020-01-30 DIAGNOSIS — E890 Postprocedural hypothyroidism: Secondary | ICD-10-CM | POA: Diagnosis present

## 2020-01-30 DIAGNOSIS — Z825 Family history of asthma and other chronic lower respiratory diseases: Secondary | ICD-10-CM

## 2020-01-30 DIAGNOSIS — Z7952 Long term (current) use of systemic steroids: Secondary | ICD-10-CM

## 2020-01-30 DIAGNOSIS — Z87891 Personal history of nicotine dependence: Secondary | ICD-10-CM

## 2020-01-30 DIAGNOSIS — D631 Anemia in chronic kidney disease: Secondary | ICD-10-CM | POA: Diagnosis present

## 2020-01-30 DIAGNOSIS — Z992 Dependence on renal dialysis: Secondary | ICD-10-CM

## 2020-01-30 DIAGNOSIS — D509 Iron deficiency anemia, unspecified: Secondary | ICD-10-CM | POA: Diagnosis present

## 2020-01-30 DIAGNOSIS — Z888 Allergy status to other drugs, medicaments and biological substances status: Secondary | ICD-10-CM

## 2020-01-30 LAB — BASIC METABOLIC PANEL
Anion gap: 14 (ref 5–15)
BUN: 52 mg/dL — ABNORMAL HIGH (ref 8–23)
CO2: 18 mmol/L — ABNORMAL LOW (ref 22–32)
Calcium: 9 mg/dL (ref 8.9–10.3)
Chloride: 102 mmol/L (ref 98–111)
Creatinine, Ser: 10.05 mg/dL — ABNORMAL HIGH (ref 0.44–1.00)
GFR calc Af Amer: 4 mL/min — ABNORMAL LOW (ref >60)
GFR calc non Af Amer: 3 mL/min — ABNORMAL LOW (ref >60)
Glucose, Bld: 106 mg/dL — ABNORMAL HIGH (ref 70–99)
Potassium: 4.5 mmol/L (ref 3.5–5.1)
Sodium: 134 mmol/L — ABNORMAL LOW (ref 135–145)

## 2020-01-30 LAB — CBC WITH DIFFERENTIAL/PLATELET
Abs Immature Granulocytes: 0.03 10*3/uL (ref 0.00–0.07)
Basophils Absolute: 0 10*3/uL (ref 0.0–0.1)
Basophils Relative: 0 %
Eosinophils Absolute: 0.1 10*3/uL (ref 0.0–0.5)
Eosinophils Relative: 1 %
HCT: 23.2 % — ABNORMAL LOW (ref 36.0–46.0)
Hemoglobin: 6.8 g/dL — CL (ref 12.0–15.0)
Immature Granulocytes: 1 %
Lymphocytes Relative: 18 %
Lymphs Abs: 0.9 10*3/uL (ref 0.7–4.0)
MCH: 30.5 pg (ref 26.0–34.0)
MCHC: 29.3 g/dL — ABNORMAL LOW (ref 30.0–36.0)
MCV: 104 fL — ABNORMAL HIGH (ref 80.0–100.0)
Monocytes Absolute: 0.4 10*3/uL (ref 0.1–1.0)
Monocytes Relative: 7 %
Neutro Abs: 3.6 10*3/uL (ref 1.7–7.7)
Neutrophils Relative %: 73 %
Platelets: 126 10*3/uL — ABNORMAL LOW (ref 150–400)
RBC: 2.23 MIL/uL — ABNORMAL LOW (ref 3.87–5.11)
RDW: 14.7 % (ref 11.5–15.5)
WBC: 5 10*3/uL (ref 4.0–10.5)
nRBC: 0 % (ref 0.0–0.2)

## 2020-01-30 LAB — FERRITIN: Ferritin: 1192 ng/mL — ABNORMAL HIGH (ref 11–307)

## 2020-01-30 LAB — FOLATE: Folate: 7.8 ng/mL (ref >5.9)

## 2020-01-30 LAB — BRAIN NATRIURETIC PEPTIDE: B Natriuretic Peptide: 294.8 pg/mL — ABNORMAL HIGH (ref 0.0–100.0)

## 2020-01-30 LAB — RETICULOCYTES
Immature Retic Fract: 13.4 % (ref 2.3–15.9)
RBC.: 2.27 MIL/uL — ABNORMAL LOW (ref 3.87–5.11)
Retic Count, Absolute: 43.8 10*3/uL (ref 19.0–186.0)
Retic Ct Pct: 1.9 % (ref 0.4–3.1)

## 2020-01-30 LAB — I-STAT VENOUS BLOOD GAS, ED
Acid-base deficit: 6 mmol/L — ABNORMAL HIGH (ref 0.0–2.0)
Bicarbonate: 21.2 mmol/L (ref 20.0–28.0)
Calcium, Ion: 1.23 mmol/L (ref 1.15–1.40)
HCT: 22 % — ABNORMAL LOW (ref 36.0–46.0)
Hemoglobin: 7.5 g/dL — ABNORMAL LOW (ref 12.0–15.0)
O2 Saturation: 92 %
Potassium: 4.5 mmol/L (ref 3.5–5.1)
Sodium: 135 mmol/L (ref 135–145)
TCO2: 23 mmol/L (ref 22–32)
pCO2, Ven: 51.7 mmHg (ref 44.0–60.0)
pH, Ven: 7.221 — ABNORMAL LOW (ref 7.250–7.430)
pO2, Ven: 76 mmHg — ABNORMAL HIGH (ref 32.0–45.0)

## 2020-01-30 LAB — I-STAT CHEM 8, ED
BUN: 53 mg/dL — ABNORMAL HIGH (ref 8–23)
Calcium, Ion: 1.21 mmol/L (ref 1.15–1.40)
Chloride: 103 mmol/L (ref 98–111)
Creatinine, Ser: 11.1 mg/dL — ABNORMAL HIGH (ref 0.44–1.00)
Glucose, Bld: 96 mg/dL (ref 70–99)
HCT: 22 % — ABNORMAL LOW (ref 36.0–46.0)
Hemoglobin: 7.5 g/dL — ABNORMAL LOW (ref 12.0–15.0)
Potassium: 4.4 mmol/L (ref 3.5–5.1)
Sodium: 135 mmol/L (ref 135–145)
TCO2: 21 mmol/L — ABNORMAL LOW (ref 22–32)

## 2020-01-30 LAB — IRON AND TIBC
Iron: 8 ug/dL — ABNORMAL LOW (ref 28–170)
Saturation Ratios: 3 % — ABNORMAL LOW (ref 10.4–31.8)
TIBC: 269 ug/dL (ref 250–450)
UIBC: 261 ug/dL

## 2020-01-30 LAB — TROPONIN I (HIGH SENSITIVITY)
Troponin I (High Sensitivity): 17 ng/L (ref <18)
Troponin I (High Sensitivity): 18 ng/L — ABNORMAL HIGH (ref <18)

## 2020-01-30 LAB — PREPARE RBC (CROSSMATCH)

## 2020-01-30 LAB — VITAMIN B12: Vitamin B-12: 1062 pg/mL — ABNORMAL HIGH (ref 180–914)

## 2020-01-30 LAB — POC OCCULT BLOOD, ED: Fecal Occult Bld: POSITIVE — AB

## 2020-01-30 LAB — SARS CORONAVIRUS 2 BY RT PCR (HOSPITAL ORDER, PERFORMED IN ~~LOC~~ HOSPITAL LAB): SARS Coronavirus 2: NEGATIVE

## 2020-01-30 MED ORDER — IPRATROPIUM-ALBUTEROL 0.5-2.5 (3) MG/3ML IN SOLN: 3.0000 mL | Freq: Four times a day (QID) | RESPIRATORY_TRACT | Status: DC | PRN

## 2020-01-30 MED ORDER — SODIUM CHLORIDE 0.9% IV SOLUTION: Freq: Once | INTRAVENOUS | Status: AC

## 2020-01-30 MED ORDER — UMECLIDINIUM BROMIDE 62.5 MCG/INH IN AEPB
1.0000 | INHALATION_SPRAY | Freq: Every day | RESPIRATORY_TRACT | Status: DC
  Administered 2020-01-31 – 2020-02-03 (×2): 1 via RESPIRATORY_TRACT
  Filled 2020-01-30: qty 7

## 2020-01-30 MED ORDER — ALBUTEROL SULFATE HFA 108 (90 BASE) MCG/ACT IN AERS
6.0000 | INHALATION_SPRAY | Freq: Once | RESPIRATORY_TRACT | Status: AC
  Administered 2020-01-30: 6 via RESPIRATORY_TRACT
  Filled 2020-01-30: qty 6.7

## 2020-01-30 MED ORDER — PANTOPRAZOLE SODIUM 40 MG IV SOLR
40.0000 mg | Freq: Once | INTRAVENOUS | Status: AC
  Administered 2020-01-30: 40 mg via INTRAVENOUS
  Filled 2020-01-30: qty 40

## 2020-01-30 MED ORDER — NITROGLYCERIN 0.4 MG SL SUBL: 0.4000 mg | SUBLINGUAL_TABLET | SUBLINGUAL | Status: DC | PRN

## 2020-01-30 MED ORDER — CALCIUM ACETATE (PHOS BINDER) 667 MG PO CAPS
667.0000 mg | ORAL_CAPSULE | Freq: Three times a day (TID) | ORAL | Status: DC
  Administered 2020-02-02 – 2020-02-03 (×5): 667 mg via ORAL
  Filled 2020-01-30 (×7): qty 1

## 2020-01-30 MED ORDER — CALCITRIOL 0.25 MCG PO CAPS: 0.2500 ug | ORAL_CAPSULE | ORAL | Status: DC

## 2020-01-30 MED ORDER — MIDODRINE HCL 5 MG PO TABS
10.0000 mg | ORAL_TABLET | Freq: Three times a day (TID) | ORAL | Status: DC
  Administered 2020-01-31 – 2020-02-03 (×10): 10 mg via ORAL
  Filled 2020-01-30 (×11): qty 2

## 2020-01-30 MED ORDER — ONDANSETRON HCL 4 MG/2ML IJ SOLN
4.0000 mg | Freq: Four times a day (QID) | INTRAMUSCULAR | Status: DC | PRN
  Administered 2020-02-02 – 2020-02-03 (×2): 4 mg via INTRAVENOUS
  Filled 2020-01-30 (×2): qty 2

## 2020-01-30 MED ORDER — LEVOTHYROXINE SODIUM 25 MCG PO TABS
125.0000 ug | ORAL_TABLET | Freq: Every day | ORAL | Status: DC
  Administered 2020-01-31 – 2020-02-03 (×3): 125 ug via ORAL
  Filled 2020-01-30 (×4): qty 1

## 2020-01-30 MED ORDER — ALBUTEROL SULFATE (2.5 MG/3ML) 0.083% IN NEBU
2.5000 mg | INHALATION_SOLUTION | RESPIRATORY_TRACT | Status: DC
  Administered 2020-01-30: 2.5 mg via RESPIRATORY_TRACT
  Filled 2020-01-30: qty 3

## 2020-01-30 MED ORDER — DILTIAZEM HCL 60 MG PO TABS
30.0000 mg | ORAL_TABLET | Freq: Three times a day (TID) | ORAL | Status: DC
  Administered 2020-01-30 – 2020-01-31 (×2): 30 mg via ORAL
  Filled 2020-01-30 (×2): qty 1

## 2020-01-30 MED ORDER — FLUTICASONE-UMECLIDIN-VILANT 100-62.5-25 MCG/INH IN AEPB: 1.0000 | INHALATION_SPRAY | Freq: Every day | RESPIRATORY_TRACT | Status: DC

## 2020-01-30 MED ORDER — PANTOPRAZOLE SODIUM 40 MG IV SOLR
40.0000 mg | Freq: Two times a day (BID) | INTRAVENOUS | Status: DC
  Administered 2020-01-31 – 2020-02-03 (×8): 40 mg via INTRAVENOUS
  Filled 2020-01-30 (×8): qty 40

## 2020-01-30 MED ORDER — FLUTICASONE FUROATE-VILANTEROL 100-25 MCG/INH IN AEPB
1.0000 | INHALATION_SPRAY | Freq: Every day | RESPIRATORY_TRACT | Status: DC
  Administered 2020-01-31 – 2020-02-03 (×2): 1 via RESPIRATORY_TRACT
  Filled 2020-01-30: qty 28

## 2020-01-30 MED ORDER — ONDANSETRON HCL 4 MG PO TABS: 4.0000 mg | ORAL_TABLET | Freq: Four times a day (QID) | ORAL | Status: DC | PRN

## 2020-01-30 NOTE — ED Notes (Signed)
Informed Dr. Rex Kras of pt's POSITIVE Occult Blood result.

## 2020-01-30 NOTE — H&P (Addendum)
History and Physical    Natasha [name] IWL:[id number] DOB: 07-15-1945 DOA: 01/30/2020  PCP: [name], MD  Patient coming from: Home.  Chief Complaint: Weakness and shortness of breath.  HPI: Natasha [name] is a 74 y.o. female with history of ESRD on hemodialysis on Monday Wednesday Friday, Crohn's disease on Humira and prednisone, lung lesions concerning for lung cancer, anemia thrombocytopenia SVT history of PE on Eliquis presents to the ER because of increasing weakness which is ongoing for last week.  Denies chest pain but has been exertional shortness of breath.  Patient also noticed some black stools over the last 1 week.  Had some vague abdominal pain around the periumbilical area no vomiting or diarrhea.  Given the symptoms patient presents to the ER.  ED Course: In the ER patient was not febrile patient required 3 to 4 L oxygen due to COPD.  Chest x-ray shows lesions concerning for metastasis.  EKG shows normal sinus rhythm with LVH.  Patient's labs were significant for hemoglobin dropping from 11.2 -6.8.  Stool for occult blood was melanotic and positive.  Patient was ordered 1 unit of PRBC and admitted for acute GI bleed.  Covid test is negative.  Review of Systems: As per HPI, rest all negative.   Past Medical History:  Diagnosis Date  . Anal fistula   . Anemia in chronic kidney disease (CKD)   . Aortic insufficiency    Echo 3/18: mod conc LVH, EF 60-65, no RWMA, Gr 1 DD, mod AI, mild MR, normal RVSF, Trivial TR  . Arthritis   . Borderline hypertension   . Bulging disc    L3-L4  . Chronic diarrhea    due to crohn's  . CKD (chronic kidney disease), stage IV ([place])    MWF- [address]  . Crohn's disease ([place])    chronic ileitis  . Dyspnea    with exertion  . Emphysema/COPD ([place])   . GERD (gastroesophageal reflux disease)    denies  . History of blood transfusion   . History of glaucoma   . History of kidney stones   . History of pancreatitis     2008--  mercaptopurine  . History of small bowel obstruction    12-03-2010  due to crohn's ileitis  . Hypertension    no longer on medications  . Hypothyroidism, postsurgical    multinodule w/ hurthle cells  . Osteoporosis   . PAF (paroxysmal atrial fibrillation) ([place])   . Perianal Crohn's disease ([place])   . Peripheral vascular disease ([place])    blood clot behind knee left leg  . Polyarthralgia   . Seizures ([place])    03/2017  . Wears partial dentures    upper    Past Surgical History:  Procedure Laterality Date  . ABDOMINAL HYSTERECTOMY  1990   and  Appendectomy  . AV FISTULA PLACEMENT Left 12/08/2017   Procedure: INSERTION OF ARTERIOVENOUS (AV) GRAFT WITH ARTEGRAFT TO LEFT UPPER ARM;  Surgeon: Conrad Lucerne, MD;  Location: [facility];  Service: Vascular;  Laterality: Left;  . BASCILIC VEIN TRANSPOSITION Left 01/09/2017   Procedure: LEFT 1ST STAGE BRACHIAL VEIN TRANSPOSITION;  Surgeon: Conrad [place], MD;  Location: [facility];  Service: Vascular;  Laterality: Left;  . BASCILIC VEIN TRANSPOSITION Left 06/16/2017   Procedure: Second Stage BASILIC VEIN TRANSPOSITION  LEFT ARM;  Surgeon: Conrad L[place]ville, MD;  Location: [facility];  Service: Vascular;  Laterality: Left;  . BIOPSY  03/09/2018   Procedure: BIOPSY;  Surgeon: Wilford Corner, MD;  [facility];  Service: Endoscopy;;  . CHOLECYSTECTOMY    . COLON RESECTION  x3 --  1978,  1987, 1989   ILEAL RESECTION x2/   [place]  . COLONOSCOPY WITH PROPOFOL N/A 09/25/2014   Procedure: COLONOSCOPY WITH PROPOFOL;  Surgeon: Garlan Fair, MD;  [facility];  Service: Endoscopy;  Laterality: N/A;  . COLONOSCOPY WITH PROPOFOL N/A 03/09/2018   Procedure: COLONOSCOPY WITH PROPOFOL;  Surgeon: Wilford Corner, MD;  [facility];  Service: Endoscopy;  Laterality: N/A;  . CYSTOSCOPY W/ URETERAL STENT PLACEMENT Right 11/19/2016   Procedure: CYSTOSCOPY WITH RIGHT RETROGRADE PYELOGRAM/ URETEROSCOPY WITH LASER AND RIGHT  URETERAL STENT PLACEMENT;  Surgeon: Ardis Hughs, MD;  [facility];  Service: Urology;  Laterality: Right;  . ESOPHAGOGASTRODUODENOSCOPY  03/04/2012   Procedure: ESOPHAGOGASTRODUODENOSCOPY (EGD);  Surgeon: Garlan Fair, MD;  [facility];  Service: Endoscopy;  Laterality: N/A;  . EXAMINATION UNDER ANESTHESIA N/A 09/12/2014   Procedure: EXAM UNDER ANESTHESIA;  Surgeon: Rolm Bookbinder, MD;  Location: [facility];  Service: General;  Laterality: N/A;  . EYE SURGERY     laser  . FLEXIBLE SIGMOIDOSCOPY  03/04/2012   Procedure: FLEXIBLE SIGMOIDOSCOPY;  Surgeon: Garlan Fair, MD;  [facility];  Service: Endoscopy;  Laterality: N/A;  . GLAUCOMA SURGERY Bilateral   . HOLMIUM LASER APPLICATION Right 3/32/9518   Procedure: HOLMIUM LASER APPLICATION;  Surgeon: Ardis Hughs, MD;  [facility];  Service: Urology;  Laterality: Right;  . INCISION AND DRAINAGE PERIRECTAL ABSCESS N/A 09/12/2014   Procedure: IRRIGATION AND DEBRIDEMENT PERIRECTAL ABSCESS;  Surgeon: Rolm Bookbinder, MD;  Location: [place] [facility];  Service: General;  Laterality: N/A;  . IR FLUORO GUIDE CV LINE RIGHT  03/20/2017  . IR US GUIDE VASC ACCESS RIGHT  03/20/2017  . NEGATIVE SLEEP STUDY  2008  . PLACEMENT OF SETON N/A 12/01/2014   Procedure: PLACEMENT OF SETON;  Surgeon: [name], MD;  Location: [facility];  Service: General;  Laterality: N/A;  . POLYPECTOMY  03/09/2018   Procedure: POLYPECTOMY;  Surgeon: Wilford Corner, MD;  [facility];  Service: Endoscopy;;  . RECTAL EXAM UNDER ANESTHESIA N/A 12/01/2014   Procedure: RECTAL EXAM UNDER ANESTHESIA;  Surgeon: [name], MD;  Location: [facility];  Service: General;  Laterality: N/A;  . TOTAL THYROIDECTOMY  10-13-2003  . TRANSTHORACIC ECHOCARDIOGRAM  07-11-2013   mild LVH,  ef 55%,  moderate AR,  mild MR and TR,  trivial PR  . TUBAL LIGATION       reports that she quit smoking about 2 years ago.  Her smoking use included cigarettes. She started smoking about 52 years ago. She has a 35.00 pack-year smoking history. She has never used smokeless tobacco. She reports that she does not drink alcohol and does not use drugs.  Allergies  Allergen Reactions  . Mercaptopurine Other (See Comments)    Caused pancreatitis  . Remicade [Infliximab] Other (See Comments)    CAUSED JOINT PAIN    Family History  Problem Relation Age of Onset  . Hypertension Father   . Heart disease Father   . Heart attack Father   . Diverticulitis Mother   . COPD Mother   . Hypertension Mother   . Heart disease Mother   . Heart attack Mother   . Heart attack Brother   . Colon cancer Brother   . Diabetes Brother   . Thyroid disease Brother  Prior to Admission medications   Medication Sig Start Date End Date Taking? Authorizing Provider  acetaminophen (TYLENOL) 500 MG tablet Take 1,000 mg by mouth every 6 (six) hours as needed for mild pain.    Yes [provider]  Adalimumab (HUMIRA) 40 MG/0.8ML PSKT Inject 40 mg into the skin every 14 (fourteen) days.    Yes [provider]  albuterol (VENTOLIN HFA) 108 (90 Base) MCG/ACT inhaler INHALE 2 PUFFS BY MOUTH EVERY 6 HOURS IF NEEDED FOR WHEEZING OR SHORTNESS OF BREATH 05/31/19  Yes Chesley Mires, MD  calcitRIOL (ROCALTROL) 0.25 MCG capsule Take 0.25 mcg by mouth every Monday, Wednesday, and Friday with hemodialysis. 02/03/17  Yes [provider]  calcium acetate (PHOSLO) 667 MG capsule Take 1 capsule (667 mg total) by mouth 3 (three) times daily with meals. 06/06/18  Yes Domenic Polite, MD  calcium-vitamin D (OSCAL WITH D) 500-200 MG-UNIT TABS tablet Take 1 tablet by mouth as needed (with dialysis).    Yes [provider]  cyanocobalamin (,VITAMIN B-12,) 1000 MCG/ML injection Inject 1,000 mcg into the muscle every 30 (thirty) days.    Yes [provider]  diltiazem (CARDIZEM) 30 MG tablet Take 1 tablet (30 mg total) by  mouth every 8 (eight) hours. 01/24/20  Yes Nahser, Wonda Cheng, MD  ELIQUIS 5 MG TABS tablet Take 2.5 mg by mouth 2 (two) times daily. Takes 1/2 tablet (2.50m) bid 08/23/18  Yes [provider]  fluticasone (FLONASE) 50 MCG/ACT nasal spray Place 1 spray into both nostrils daily as needed for allergies.  03/19/19  Yes [provider]  Fluticasone-Umeclidin-Vilant (TRELEGY ELLIPTA) 100-62.5-25 MCG/INH AEPB Inhale 1 puff into the lungs daily. 01/17/20  Yes WMartyn Ehrich NP  ipratropium-albuterol (DUONEB) 0.5-2.5 (3) MG/3ML SOLN Take 3 mLs by nebulization every 6 (six) hours as needed. Patient taking differently: Take 3 mLs by nebulization every 6 (six) hours as needed (sob/wheezing).  12/30/18  Yes NFenton Foy NP  levothyroxine (SYNTHROID, LEVOTHROID) 125 MCG tablet Take 125 mcg by mouth daily before breakfast.    Yes [provider]  lidocaine-prilocaine (EMLA) cream Apply 1 application topically every Monday, Wednesday, and Friday.  11/29/18  Yes [provider]  loperamide (IMODIUM) 2 MG capsule Take 2 capsules (4 mg total) by mouth as needed for diarrhea or loose stools. 03/27/17  Yes Gherghe, CVella Redhead MD  Methoxy PEG-Epoetin Beta (MIRCERA IJ) Inject 150 mg as directed every 14 (fourteen) days. 06/13/19 06/11/20 Yes [provider]  midodrine (PROAMATINE) 10 MG tablet Take 1 tablet (10 mg total) by mouth 3 (three) times daily with meals. 01/24/20  Yes Nahser, PWonda Cheng MD  nitroGLYCERIN (NITROSTAT) 0.4 MG SL tablet Place 0.4 mg under the tongue every 5 (five) minutes as needed for chest pain.   Yes [provider]  ondansetron (ZOFRAN ODT) 4 MG disintegrating tablet Take 1 tablet (4 mg total) by mouth every 8 (eight) hours as needed for nausea or vomiting. 03/29/18  Yes FDeno Etienne DO  predniSONE (DELTASONE) 5 MG tablet Take 5 mg by mouth daily with breakfast.   Yes [provider]  Fluticasone-Umeclidin-Vilant (TRELEGY ELLIPTA) 100-62.5-25  MCG/INH AEPB Inhale 1 puff into the lungs daily. Patient not taking: Reported on 01/30/2020 07/25/19   SChesley Mires MD  nitroGLYCERIN (NITROSTAT) 0.4 MG SL tablet Place 1 tablet (0.4 mg total) under the tongue every 5 (five) minutes as needed for chest pain. 05/03/19 12/27/19  Nahser, PWonda Cheng MD  OXYGEN Inhale 2-3 L into the  lungs See admin instructions. Use every night and during the day as needed for shortness of breath    [provider]  predniSONE (DELTASONE) 10 MG tablet Take 7m x 3 days, then 169mx 3 days, then 1042m 3 days, then resume daily 5 mg Patient not taking: Reported on 01/30/2020 01/19/20   SooChesley MiresD  Respiratory Therapy Supplies (NEBULIZER) DEVI 1 Device by Does not apply route as needed. 12/07/18   NicFenton FoyP    Physical Exam: Constitutional: Moderately built and nourished. Vitals:   01/30/20 1745 01/30/20 2115 01/30/20 2217 01/30/20 2255  BP: (!) 142/70 (!) 144/69 135/87 124/67  Pulse: 85 96 85 86  Resp: (!) 21 17 20 20   Temp:  98.9 F (37.2 C) 97.8 F (36.6 C) 98.8 F (37.1 C)  TempSrc:  Oral Oral Oral  SpO2: 100% 100% 100% 100%  Weight:      Height:       Eyes: Anicteric no pallor. ENMT: No discharge from the ears eyes nose or mouth. Neck: No mass felt.  No neck rigidity. Respiratory: No rhonchi or crepitations. Cardiovascular: S1-S2 heard. Abdomen: Soft nontender bowel sounds present. Musculoskeletal: No edema. Skin: No rash. Neurologic: Alert awake oriented to time place and person.  Moves all extremities. Psychiatric: Appears normal.  Normal affect.   Labs on Admission: I have personally reviewed following labs and imaging studies  CBC: Recent Labs  Lab 01/30/20 1741 01/30/20 1810 01/30/20 1811  WBC 5.0  --   --   NEUTROABS 3.6  --   --   HGB 6.8* 7.5* 7.5*  HCT 23.2* 22.0* 22.0*  MCV 104.0*  --   --   PLT 126*  --   --    Basic Metabolic Panel: Recent Labs  Lab 01/30/20 1741 01/30/20 1810 01/30/20 1811  NA 134*  135 135  K 4.5 4.5 4.4  CL 102  --  103  CO2 18*  --   --   GLUCOSE 106*  --  96  BUN 52*  --  53*  CREATININE 10.05*  --  11.10*  CALCIUM 9.0  --   --    GFR: Estimated Creatinine Clearance: 5.6 mL/min (A) (by C-G formula based on SCr of 11.1 mg/dL (H)). Liver Function Tests: No results for input(s): AST, ALT, ALKPHOS, BILITOT, PROT, ALBUMIN in the last 168 hours. No results for input(s): LIPASE, AMYLASE in the last 168 hours. No results for input(s): AMMONIA in the last 168 hours. Coagulation Profile: No results for input(s): INR, PROTIME in the last 168 hours. Cardiac Enzymes: No results for input(s): CKTOTAL, CKMB, CKMBINDEX, TROPONINI in the last 168 hours. BNP (last 3 results) No results for input(s): PROBNP in the last 8760 hours. HbA1C: No results for input(s): HGBA1C in the last 72 hours. CBG: No results for input(s): GLUCAP in the last 168 hours. Lipid Profile: No results for input(s): CHOL, HDL, LDLCALC, TRIG, CHOLHDL, LDLDIRECT in the last 72 hours. Thyroid Function Tests: No results for input(s): TSH, T4TOTAL, FREET4, T3FREE, THYROIDAB in the last 72 hours. Anemia Panel: Recent Labs    01/30/20 2106  VITAMINB12 1,062*  FOLATE 7.8  FERRITIN 1,192*  TIBC 269  IRON 8*  RETICCTPCT 1.9   Urine analysis:    Component Value Date/Time   COLORURINE YELLOW 11/18/2019 2138   APPEARANCEUR CLEAR 11/18/2019 2138   LABSPEC 1.019 11/18/2019 2138   PHURINE 6.0 11/18/2019 2138   GLUCOSEU 50 (A) 11/18/2019 2138   HGBUR MODERATE (A)  11/18/2019 2138   [place] NEGATIVE 11/18/2019 2138   [place] NEGATIVE 11/18/2019 2138   PROTEINUR 30 (A) 11/18/2019 2138   UROBILINOGEN 0.2 09/09/2014 0850   NITRITE NEGATIVE 11/18/2019 2138   LEUKOCYTESUR NEGATIVE 11/18/2019 2138   Sepsis Labs: @LABRCNTIP (procalcitonin:4,lacticidven:4) ) Recent Results (from the past 240 hour(s))  SARS Coronavirus 2 by RT PCR (hospital order, performed in [facility] lab) Nasopharyngeal  Nasopharyngeal Swab     Status: None   Collection Time: 01/30/20  5:37 PM   Specimen: Nasopharyngeal Swab  Result Value Ref Range Status   SARS Coronavirus 2 NEGATIVE NEGATIVE Final    Comment: (NOTE) SARS-CoV-2 target nucleic acids are NOT DETECTED.  The SARS-CoV-2 RNA is generally detectable in upper and lower respiratory specimens during the acute phase of infection. The lowest concentration of SARS-CoV-2 viral copies this assay can detect is 250 copies / mL. A negative result does not preclude SARS-CoV-2 infection and should not be used as the sole basis for treatment or other patient management decisions.  A negative result may occur with improper specimen collection / handling, submission of specimen other than nasopharyngeal swab, presence of viral mutation(s) within the areas targeted by this assay, and inadequate number of viral copies (<250 copies / mL). A negative result must be combined with clinical observations, patient history, and epidemiological information.  Fact Sheet for Patients:   StrictlyIdeas.no  Fact Sheet for Healthcare Providers: BankingDealers.co.za  This test is not yet approved or  cleared by the [place] FDA and has been authorized for detection and/or diagnosis of SARS-CoV-2 by FDA under an Emergency Use Authorization (EUA).  This EUA will remain in effect (meaning this test can be used) for the duration of the COVID-19 declaration under Section 564(b)(1) of the Act, 21 U.S.C. section 360bbb-3(b)(1), unless the authorization is terminated or revoked sooner.  Performed at [facility], [place] [address] [place], [place] [address]      Radiological Exams on Admission: DG Chest Port 1 View  Result Date: 01/30/2020 CLINICAL DATA:  Shortness of breath.  COPD.  On dialysis. EXAM: PORTABLE CHEST 1 VIEW COMPARISON:  11/18/2019 plain film and CT. FINDINGS: Hyperinflation. Left axillary vascular  stent. Surgical clips in the lower neck. Mild cardiomegaly. Atherosclerosis in the transverse aorta. Left perihilar lung nodule has enlarged, 2.8 cm today versus 2.1 cm on 11/18/2019. No pleural effusion or pneumothorax. Numerous leads and wires project over the chest. No lobar consolidation. IMPRESSION: Hyperinflation, without acute superimposed disease. Aortic Atherosclerosis (ICD10-I70.0). Enlarging left upper lobe lung nodule, as on prior exams (metastatic disease per CT of 11/18/2019). Electronically Signed   By: Abigail Miyamoto M.D.   On: 01/30/2020 17:30    EKG: Independently reviewed.  Normal sinus rhythm LVH.  Assessment/Plan Principal Problem:   Acute GI bleeding Active Problems:   Crohn's disease without complication (HCC)   Hypothyroidism   Emphysema/COPD (HCC)   Chronic diastolic CHF (congestive heart failure) (HCC)   Hx of Pulmonary embolism (HCC)   Metastatic lung carcinoma, left (HCC)   Acute blood loss anemia    1. Acute GI bleeding in the setting of anticoagulation patient takes Eliquis for PE -likely from upper GI bleeds since patient has melanotic stools.  1 unit of PRBC has been ordered.  Follow CBC we will keep patient n.p.o. IV Protonix and consult Dr. Michail Sermon of [facility]. 2. Acute blood loss anemia receiving 1 unit of PRBC transfusion. 3. History of Crohn's disease on Humira and takes prednisone presently on stress  dose steroids. 4. History of PE and DVT on apixaban.  Holding apixaban due to acute GI bleed.  Patient agreeable. 5. COPD not actively wheezing. 6. ESRD on hemodialysis on Monday Wednesday and Friday missed dialysis today due to weakness.  Consult nephrology. 7. History of SVT on Cardizem. 8. History of hypothyroidism on Synthroid. 9. Lesions in the lung concerning for metastatic lung cancer being followed by oncologist and pulmonologist.  Since patient has acute GI bleed with severe anemia in the setting of anticoagulation will need close monitoring for  any further worsening in inpatient status.   DVT prophylaxis: SCDs.  Patient is having acute GI bleed so avoiding anticoagulation. Code Status: Full code. Family Communication: Discussed with patient. Disposition Plan: Home. Consults called: None. Admission status: Inpatient.   Rise Patience MD [facility] Pager [phone].  If 7PM-7AM, please contact night-coverage [email or web address] Password [email or web address]  01/30/2020, 11:18 PM

## 2020-01-30 NOTE — ED Provider Notes (Signed)
[facility] EMERGENCY DEPARTMENT Provider Note   CSN: [id number] Arrival date & time: 01/30/20  1641     History Chief Complaint  Patient presents with   Shortness of Breath    Natasha [name] is a 74 y.o. female history of hypertension, ESRD dialyzing Monday Wednesday Friday through left upper extremity AV fistula, diastolic heart failure, history of DVT/PE on Eliquis, history of severe COPD with baseline oxygen requirement of 3 to 4 L presenting to the ED today for worsening shortness of breath.  Patient states that this began relatively gradually over the last week, worsened with exertion, not associated with any change in cough or fevers that is associate with some degree of chest tightness.  She states that she cannot walk more than a few feet without having to stop to catch her breath.  She reports having reached out to her pulmonologist with these complaints a couple weeks ago for which she was prescribed a higher dose of steroids with some improvement while they lasted.  The history is provided by the patient.  Illness Quality:  Shortness of breath Severity:  Severe Onset quality:  Gradual Duration:  1 week Timing:  Constant Progression:  Worsening Chronicity:  New Context:  History of COPD, history of PE, history of CHF, on dialysis Associated symptoms: chest pain, fatigue and shortness of breath   Associated symptoms: no abdominal pain, no cough, no fever, no headaches, no rash and no vomiting        Past Medical History:  Diagnosis Date   Anal fistula    Anemia in chronic kidney disease (CKD)    Aortic insufficiency    Echo 3/18: mod conc LVH, EF 60-65, no RWMA, Gr 1 DD, mod AI, mild MR, normal RVSF, Trivial TR   Arthritis    Borderline hypertension    Bulging disc    L3-L4   Chronic diarrhea    due to crohn's   CKD (chronic kidney disease), stage IV ([place])    MWF- [address]   Crohn's disease ([place])    chronic ileitis    Dyspnea    with exertion   Emphysema/COPD (HCC)    GERD (gastroesophageal reflux disease)    denies   History of blood transfusion    History of glaucoma    History of kidney stones    History of pancreatitis    2008--  mercaptopurine   History of small bowel obstruction    12-03-2010  due to crohn's ileitis   Hypertension    no longer on medications   Hypothyroidism, postsurgical    multinodule w/ hurthle cells   Osteoporosis    PAF (paroxysmal atrial fibrillation) ([place])    Perianal Crohn's disease ([place])    Peripheral vascular disease ([place])    blood clot behind knee left leg   Polyarthralgia    Seizures ([place])    03/2017   Wears partial dentures    upper    Patient Active Problem List   Diagnosis Date Noted   Acute GI bleeding 01/30/2020   Supraventricular tachycardia ([place]) 12/27/2019   Arrhythmia 11/19/2019   Elevated troponin 11/19/2019   Epigastric abdominal pain 11/18/2019   Malignant neoplasm of overlapping sites of left lung ([place]) 11/17/2019   Metastatic lung carcinoma, left ([place]) 11/15/2019   Goals of care, counseling/discussion 11/15/2019   Pneumonia of right lower lobe due to infectious organism 09/27/2019   History of COVID-19 04/13/2019   Acute on chronic respiratory failure with hypoxia ([place]) 04/13/2019  Chronic anticoagulation 04/13/2019   Medication management 02/22/2019   Chronic respiratory failure with hypoxia ([place]) 09/23/2018   Hx of Pulmonary embolism ([place]) 05/28/2018   COPD with acute exacerbation ([place]) 03/31/2018   Acute sinusitis 03/31/2018   Crohn's disease of colon with complication ([place])    Aortic insufficiency 07/30/2016   Atherosclerosis of aorta ([place]) 07/30/2016   Pressure injury of skin 07/14/2016   Gastroenteritis, acute 07/12/2016   COPD (chronic obstructive pulmonary disease) ([place]) 10/12/2015   Malnutrition of moderate degree ([place]) 09/13/2014   history of Left leg DVT 09/10/2014    Hypotension 09/10/2014   Hypoglycemia 09/10/2014   Ischiorectal abscess 09/09/2014   Increased anion gap metabolic acidosis    Generalized abdominal pain    Chronic diastolic CHF (congestive heart failure) ([place])    Hypertensive heart disease    Secondary hyperparathyroidism ([place])    Other specified hypothyroidism    Right shoulder pain    Thrombocytopenia ([place])    Severe protein-calorie malnutrition ([place])    Thyroid activity decreased    Emphysema of lung ([place])    Nephrolithiasis 03/29/2014   Spinal stenosis of lumbar region 11/01/2013   Bilateral leg pain 09/28/2013   Emphysema/COPD ([place])    Shortness of breath 09/20/2013   Protein-calorie malnutrition, severe ([place]) 05/14/2013   Salmonella enteritidis 06/11/2012   Hypocalcemia 06/10/2012   Hypothyroidism 06/10/2012   Anemia of chronic disease 06/09/2012   Hypomagnesemia 06/09/2012   ESRD on dialysis ([place]) 06/09/2012   Dehydration 06/07/2012   Crohn's disease without complication ([place]) 93/79/0240   Metabolic acidosis 97/35/3299   Hypokalemia 06/07/2012   Nausea vomiting and diarrhea 06/07/2012   UTI (lower urinary tract infection) 06/07/2012    Past Surgical History:  Procedure Laterality Date   ABDOMINAL HYSTERECTOMY  1990   and  Appendectomy   AV FISTULA PLACEMENT Left 12/08/2017   Procedure: INSERTION OF ARTERIOVENOUS (AV) GRAFT WITH ARTEGRAFT TO LEFT UPPER ARM;  Surgeon: Conrad Millard, MD;  Location: [place];  Service: Vascular;  Laterality: Left;   [place] Left 01/09/2017   Procedure: LEFT 1ST STAGE BRACHIAL VEIN TRANSPOSITION;  Surgeon: Conrad Hull, MD;  Location: [place];  Service: Vascular;  Laterality: Left;   [place] Left 06/16/2017   Procedure: Second Stage BASILIC VEIN TRANSPOSITION  LEFT ARM;  Surgeon: Conrad Orchard, MD;  Location: [facility] OR;  Service: Vascular;  Laterality: Left;   BIOPSY  03/09/2018   Procedure: BIOPSY;  Surgeon: Wilford Corner, MD;  Location: [facility];  Service: Endoscopy;;   CHOLECYSTECTOMY     COLON RESECTION  x3 --  1978,  1987, [place] x2/   RIGHT COLECTOMY 1989   COLONOSCOPY WITH PROPOFOL N/A 09/25/2014   Procedure: COLONOSCOPY WITH PROPOFOL;  Surgeon: Garlan Fair, MD;  Location: [facility];  Service: Endoscopy;  Laterality: N/A;   COLONOSCOPY WITH PROPOFOL N/A 03/09/2018   Procedure: COLONOSCOPY WITH PROPOFOL;  Surgeon: Wilford Corner, MD;  [facility];  Service: Endoscopy;  Laterality: N/A;   CYSTOSCOPY W/ URETERAL STENT PLACEMENT Right 11/19/2016   Procedure: CYSTOSCOPY WITH RIGHT RETROGRADE PYELOGRAM/ URETEROSCOPY WITH LASER AND RIGHT URETERAL STENT PLACEMENT;  Surgeon: Ardis Hughs, MD;  Location: [facility];  Service: Urology;  Laterality: Right;   ESOPHAGOGASTRODUODENOSCOPY  03/04/2012   Procedure: ESOPHAGOGASTRODUODENOSCOPY (EGD);  Surgeon: Garlan Fair, MD;  [facility];  Service: Endoscopy;  Laterality: N/A;   EXAMINATION UNDER ANESTHESIA N/A 09/12/2014   Procedure: EXAM UNDER ANESTHESIA;  Surgeon: Rodman Key  Donne Hazel, MD;  Location: [place];  Service: General;  Laterality: N/A;   EYE SURGERY     laser   FLEXIBLE SIGMOIDOSCOPY  03/04/2012   Procedure: FLEXIBLE SIGMOIDOSCOPY;  Surgeon: Garlan Fair, MD;  [facility];  Service: Endoscopy;  Laterality: N/A;   GLAUCOMA SURGERY Bilateral    HOLMIUM LASER APPLICATION Right 0/76/8088   Procedure: HOLMIUM LASER APPLICATION;  Surgeon: Ardis Hughs, MD;  [facility];  Service: Urology;  Laterality: Right;   INCISION AND DRAINAGE PERIRECTAL ABSCESS N/A 09/12/2014   Procedure: IRRIGATION AND DEBRIDEMENT PERIRECTAL ABSCESS;  Surgeon: Rolm Bookbinder, MD;  Location: [place];  Service: General;  Laterality: N/A;   IR FLUORO GUIDE CV LINE RIGHT  03/20/2017   IR US GUIDE VASC ACCESS RIGHT  03/20/2017   NEGATIVE SLEEP STUDY  2008   PLACEMENT OF SETON N/A 12/01/2014    Procedure: PLACEMENT OF SETON;  Surgeon: [name], MD;  Location[facility];  Service: General;  Laterality: N/A;   POLYPECTOMY  03/09/2018   Procedure: POLYPECTOMY;  Surgeon: Wilford Corner, MD;  [facility];  Service: Endoscopy;;   RECTAL EXAM UNDER ANESTHESIA N/A 12/01/2014   Procedure: RECTAL EXAM UNDER ANESTHESIA;  Surgeon: [name], MD;  Location: [place];  Service: General;  Laterality: N/A;   TOTAL THYROIDECTOMY  10-13-2003   TRANSTHORACIC ECHOCARDIOGRAM  07-11-2013   mild LVH,  ef 55%,  moderate AR,  mild MR and TR,  trivial PR   TUBAL LIGATION       OB History   No obstetric history on file.     Family History  Problem Relation Age of Onset   Hypertension Father    Heart disease Father    Heart attack Father    Diverticulitis Mother    COPD Mother    Hypertension Mother    Heart disease Mother    Heart attack Mother    Heart attack Brother    Colon cancer Brother    Diabetes Brother    Thyroid disease Brother     Social History   Tobacco Use   Smoking status: Former Smoker    Packs/day: 1.00    Years: 35.00    Pack years: 35.00    Types: Cigarettes    Start date: 02/22/1967    Quit date: 11/27/2017    Years since quitting: 2.1   Smokeless tobacco: Never Used  Vaping Use   Vaping Use: Never used  Substance Use Topics   Alcohol use: No   Drug use: No    Home Medications Prior to Admission medications   Medication Sig Start Date End Date Taking? Authorizing Provider  acetaminophen (TYLENOL) 500 MG tablet Take 1,000 mg by mouth every 6 (six) hours as needed for mild pain.     [provider]  Adalimumab (HUMIRA) 40 MG/0.8ML PSKT Inject 40 mg into the skin every 14 (fourteen) days.     [provider]  albuterol (VENTOLIN HFA) 108 (90 Base) MCG/ACT inhaler INHALE 2 PUFFS BY MOUTH EVERY 6 HOURS IF NEEDED FOR WHEEZING OR SHORTNESS OF BREATH 05/31/19   Chesley Mires,  MD  alendronate (FOSAMAX) 70 MG tablet Take 70 mg by mouth every Monday.  11/06/16   [provider]  calcitRIOL (ROCALTROL) 0.25 MCG capsule Take 0.25 mcg by mouth every Monday, Wednesday, and Friday with hemodialysis. 02/03/17   [provider]  calcium acetate (PHOSLO) 667 MG capsule Take 1 capsule (667 mg total) by mouth 3 (  three) times daily with meals. 06/06/18   Domenic Polite, MD  calcium-vitamin D (OSCAL WITH D) 500-200 MG-UNIT TABS tablet Take 1 tablet by mouth as needed (with dialysis).     [provider]  cyanocobalamin (,VITAMIN B-12,) 1000 MCG/ML injection Inject 1,000 mcg into the muscle every 30 (thirty) days.     [provider]  diltiazem (CARDIZEM) 30 MG tablet Take 1 tablet (30 mg total) by mouth every 8 (eight) hours. 01/24/20   Nahser, Wonda Cheng, MD  ELIQUIS 5 MG TABS tablet Take 2.5 mg by mouth 2 (two) times daily. Takes 1/2 tablet (2.52m) bid 08/23/18   [provider]  fluticasone (FLONASE) 50 MCG/ACT nasal spray Place 1-2 sprays into both nostrils daily. 03/19/19   [provider]  Fluticasone-Umeclidin-Vilant (TRELEGY ELLIPTA) 100-62.5-25 MCG/INH AEPB Inhale 1 puff into the lungs daily. 07/25/19   SChesley Mires MD  Fluticasone-Umeclidin-Vilant (TRELEGY ELLIPTA) 100-62.5-25 MCG/INH AEPB Inhale 1 puff into the lungs daily. 01/17/20   WMartyn Ehrich NP  ipratropium-albuterol (DUONEB) 0.5-2.5 (3) MG/3ML SOLN Take 3 mLs by nebulization every 6 (six) hours as needed. 12/30/18   NFenton Foy NP  levothyroxine (SYNTHROID, LEVOTHROID) 125 MCG tablet Take 125 mcg by mouth daily before breakfast.     [provider]  lidocaine-prilocaine (EMLA) cream Apply 1 application topically every Monday, Wednesday, and Friday.  11/29/18   [provider]  loperamide (IMODIUM) 2 MG capsule Take 2 capsules (4 mg total) by mouth as needed for diarrhea or loose stools. 03/27/17   GCaren Griffins MD  Methoxy PEG-Epoetin Beta  (MIRCERA IJ) Inject 150 mg as directed every 14 (fourteen) days. 06/13/19 06/11/20  [provider]  midodrine (PROAMATINE) 10 MG tablet Take 1 tablet (10 mg total) by mouth 3 (three) times daily with meals. 01/24/20   Nahser, PWonda Cheng MD  nitroGLYCERIN (NITROSTAT) 0.4 MG SL tablet Place 1 tablet (0.4 mg total) under the tongue every 5 (five) minutes as needed for chest pain. 05/03/19 12/27/19  Nahser, PWonda Cheng MD  ondansetron (ZOFRAN ODT) 4 MG disintegrating tablet Take 1 tablet (4 mg total) by mouth every 8 (eight) hours as needed for nausea or vomiting. 03/29/18   FDeno Etienne DO  OXYGEN Inhale 2-3 L into the lungs See admin instructions. Use every night and during the day as needed for shortness of breath    [provider]  predniSONE (DELTASONE) 10 MG tablet Take 214mx 3 days, then 1565m 3 days, then 64m30m3 days, then resume daily 5 mg 01/19/20   SoodChesley Mires  predniSONE (DELTASONE) 5 MG tablet Take 5 mg by mouth daily with breakfast.    [provider]  Respiratory Therapy Supplies (NEBULIZER) DEVI 1 Device by Does not apply route as needed. 12/07/18   NichFenton Foy    Allergies    Mercaptopurine and Remicade [infliximab]  Review of Systems   Review of Systems  Constitutional: Positive for fatigue. Negative for chills and fever.  HENT: Negative for facial swelling and voice change.   Eyes: Negative for redness and visual disturbance.  Respiratory: Positive for shortness of breath. Negative for cough.   Cardiovascular: Positive for chest pain. Negative for palpitations.  Gastrointestinal: Negative for abdominal pain and vomiting.  Genitourinary: Negative for difficulty urinating and dysuria.  Musculoskeletal: Negative for gait problem and joint swelling.  Skin: Negative for rash and wound.  Neurological: Positive for weakness. Negative for dizziness and headaches.  Psychiatric/Behavioral: Negative for confusion  and suicidal ideas.    Physical  Exam Updated Vital Signs BP (!) 142/70    Pulse 85    Temp 98.2 F (36.8 C) (Oral)    Resp (!) 21    Ht 5' 4"  (1.626 m)    Wt 115.5 kg    SpO2 100%    BMI 43.71 kg/m   Physical Exam Constitutional:      General: She is not in acute distress.    Comments: On baseline 4 L oxygen  HENT:     Head: Normocephalic and atraumatic.     Mouth/Throat:     Mouth: Mucous membranes are moist.     Pharynx: Oropharynx is clear.  Eyes:     General: No scleral icterus.    Pupils: Pupils are equal, round, and reactive to light.  Cardiovascular:     Rate and Rhythm: Normal rate and regular rhythm.     Pulses: Normal pulses.  Pulmonary:     Effort: Pulmonary effort is normal. Tachypnea present. No respiratory distress.     Breath sounds: No decreased breath sounds, wheezing, rhonchi or rales.  Abdominal:     General: There is no distension.     Tenderness: There is no abdominal tenderness.  Musculoskeletal:        General: No tenderness or deformity.     Cervical back: Normal range of motion and neck supple.     Right lower leg: Edema present.     Left lower leg: Edema present.     Comments: 1+ pitting edema to bilateral lower extremities, symmetric  Neurological:     General: No focal deficit present.     Mental Status: She is alert and oriented to person, place, and time.  Psychiatric:        Mood and Affect: Mood normal.        Behavior: Behavior normal.     ED Results / Procedures / Treatments   Labs (all labs ordered are listed, but only abnormal results are displayed) Labs Reviewed  BASIC METABOLIC PANEL - Abnormal; Notable for the following components:      Result Value   Sodium 134 (*)    CO2 18 (*)    Glucose, Bld 106 (*)    BUN 52 (*)    Creatinine, Ser 10.05 (*)    GFR calc non Af Amer 3 (*)    GFR calc Af Amer 4 (*)    All other components within normal limits  CBC WITH DIFFERENTIAL/PLATELET - Abnormal; Notable for the following components:   RBC 2.23 (*)     Hemoglobin 6.8 (*)    HCT 23.2 (*)    MCV 104.0 (*)    MCHC 29.3 (*)    Platelets 126 (*)    All other components within normal limits  BRAIN NATRIURETIC PEPTIDE - Abnormal; Notable for the following components:   B Natriuretic Peptide 294.8 (*)    All other components within normal limits  I-STAT CHEM 8, ED - Abnormal; Notable for the following components:   BUN 53 (*)    Creatinine, Ser 11.10 (*)    TCO2 21 (*)    Hemoglobin 7.5 (*)    HCT 22.0 (*)    All other components within normal limits  I-STAT VENOUS BLOOD GAS, ED - Abnormal; Notable for the following components:   pH, Ven 7.221 (*)    pO2, Ven 76.0 (*)    Acid-base deficit 6.0 (*)    HCT 22.0 (*)  Hemoglobin 7.5 (*)    All other components within normal limits  POC OCCULT BLOOD, ED - Abnormal; Notable for the following components:   Fecal Occult Bld POSITIVE (*)    All other components within normal limits  SARS CORONAVIRUS 2 BY RT PCR (HOSPITAL ORDER, [place])  VITAMIN B12  FOLATE  IRON AND TIBC  FERRITIN  RETICULOCYTES  OCCULT BLOOD X 1 CARD TO LAB, STOOL  TYPE AND SCREEN  PREPARE RBC (CROSSMATCH)  TROPONIN I (HIGH SENSITIVITY)  TROPONIN I (HIGH SENSITIVITY)    EKG EKG Interpretation  Date/Time:  Monday January 30 2020 16:43:29 EDT Ventricular Rate:  88 PR Interval:    QRS Duration: 75 QT Interval:  370 QTC Calculation: 448 R Axis:   14 Text Interpretation: Sinus rhythm Left ventricular hypertrophy similar to previous Confirmed by Theotis Burrow [phone]) on 01/30/2020 4:49:18 PM   Radiology No results found.  Procedures Procedures (including critical care time)  Medications Ordered in ED Medications  0.9 %  sodium chloride infusion (Manually program via Guardrails IV Fluids) (has no administration in time range)  pantoprazole (PROTONIX) injection 40 mg (has no administration in time range)  albuterol (VENTOLIN HFA) 108 (90 Base) MCG/ACT inhaler 6 puff (6 puffs  Inhalation Given 01/30/20 1810)    ED Course  I have reviewed the triage vital signs and the nursing notes.  Pertinent labs & imaging results that were available during my care of the patient were reviewed by me and considered in my medical decision making (see chart for details).  Clinical Course as of Jan 30 1915  Mon Jan 30, 2020  3817 Fecal Occult Blood, POC(!): POSITIVE [JR]    Clinical Course User Index [JR] Renold Genta, MD   MDM Rules/Calculators/A&P                         EKG findings by my read: Compared to prior: 11/22/2019.  Rate: 80 rhythm: sinus Axis: appropriate  PR: Appropriate QRS: 75 QTc: 440.  High voltage in lateral leads consistent with left ventricular hypertrophy similar to prior, otherwise no evidence of ischemia or arrhythmia, nor any other pathologic findings concerning considering patient presentation. Findings discussed with attending who agrees.  Differential diagnosis considered:  CHF exacerbation, volume overload, PE, COPD, pneumonia, pneumothorax, COVID-19  Patient presents to ED complaining of shortness of breath with exertion and appears to be slightly volume overloaded on exam, unfortunately missed dialysis today but is otherwise been for filled all of her sessions.  Notably she is not any significant respiratory distress at least at rest in the setting high 90s to 100 on home 4 L of oxygen.  Her exam is not particularly suggestive of obstructive pathology with no appreciable wheeze or diminished/tight breath sounds.  We will minister albuterol puffs for now, test for Covid obtain broad shortness of breath work-up including EKG, troponin, BNP and chemistries.  Of note the patient did however respond favorably to up titration of steroids in the last such presentation least per patient.  We will hold off on antibiotics and steroids at this time given no obvious obstructive pathology.  Initial CBC showing a hemoglobin of 6.8 which represents an acute  change from prior, on further review patient states that she has had dark tarry stools that started relatively suddenly 2 weeks ago, this is progressed to almost a greenish color.  Rectal exam performed with chaperone bedside, thin greenish-black stool noted, swab for FOBT.  Patient's symptoms could easily be attributed to symptomatic anemia and feel the patient would likely benefit from transfusion given hemoglobin less than 7.0 and her symptoms.  Blood ordered for transfusion, patient consented.  We will add on anemia panel, felt patient would likely benefit from admission for formal anemia work-up, consideration of GI evaluation and patient vertically considering that she is on full anticoagulation.  Protonix ordered for administration  Hospital medicine consulted for admission and patient accepted to their service.  Final Clinical Impression(s) / ED Diagnoses Final diagnoses:  Symptomatic anemia  Melena    Rx / DC Orders ED Discharge Orders    None     Labs, studies and imaging reviewed by myself and considered in medical decision making if ordered. Imaging interpreted by radiology. Pt was discussed with my attending, Dr. Rex Kras  Electronically signed by:  Tyrees Chopin [place]9/6/20217:16 PM       Renold Genta, MD 01/30/20 [place], Wenda Overland, MD 01/30/20 [phone]

## 2020-01-30 NOTE — ED Triage Notes (Signed)
Assume care from EMS per EMS pt reporrts increase SOB since last Thursday and have a hx of COPD and wear a baseline of 3L of O2. Pt reports called PCP this week where pt stated for pt to increase O2 to 4L , However no relief of s/s. Pt reports is a dialysis pt and receives dialysis mon,wed, fri and miss today due to s/s. Pt denies any chest pain, fevers and cough. Pt place on the monitor x 3

## 2020-01-31 ENCOUNTER — Inpatient Hospital Stay (HOSPITAL_COMMUNITY): Payer: Medicare Other | Admitting: Certified Registered Nurse Anesthetist

## 2020-01-31 ENCOUNTER — Encounter (HOSPITAL_COMMUNITY): Payer: Self-pay | Admitting: Internal Medicine

## 2020-01-31 ENCOUNTER — Encounter (HOSPITAL_COMMUNITY)
Admission: EM | Disposition: A | Discharge status: Home / Self Care | Payer: Self-pay | Source: Home / Self Care | Attending: Internal Medicine

## 2020-01-31 ENCOUNTER — Other Ambulatory Visit: Payer: Self-pay

## 2020-01-31 DIAGNOSIS — C7802 Secondary malignant neoplasm of left lung: Secondary | ICD-10-CM

## 2020-01-31 DIAGNOSIS — J439 Emphysema, unspecified: Secondary | ICD-10-CM

## 2020-01-31 DIAGNOSIS — K5090 Crohn's disease, unspecified, without complications: Secondary | ICD-10-CM

## 2020-01-31 DIAGNOSIS — E039 Hypothyroidism, unspecified: Secondary | ICD-10-CM

## 2020-01-31 HISTORY — PX: ESOPHAGOGASTRODUODENOSCOPY (EGD) WITH PROPOFOL: SHX5813

## 2020-01-31 HISTORY — PX: BIOPSY: SHX5522

## 2020-01-31 LAB — BASIC METABOLIC PANEL
Anion gap: 16 — ABNORMAL HIGH (ref 5–15)
BUN: 56 mg/dL — ABNORMAL HIGH (ref 8–23)
CO2: 17 mmol/L — ABNORMAL LOW (ref 22–32)
Calcium: 8.6 mg/dL — ABNORMAL LOW (ref 8.9–10.3)
Chloride: 104 mmol/L (ref 98–111)
Creatinine, Ser: 10.69 mg/dL — ABNORMAL HIGH (ref 0.44–1.00)
GFR calc Af Amer: 4 mL/min — ABNORMAL LOW (ref >60)
GFR calc non Af Amer: 3 mL/min — ABNORMAL LOW (ref >60)
Glucose, Bld: 72 mg/dL (ref 70–99)
Potassium: 3.9 mmol/L (ref 3.5–5.1)
Sodium: 137 mmol/L (ref 135–145)

## 2020-01-31 LAB — CBC
HCT: 28.2 % — ABNORMAL LOW (ref 36.0–46.0)
Hemoglobin: 8.2 g/dL — ABNORMAL LOW (ref 12.0–15.0)
MCH: 29.2 pg (ref 26.0–34.0)
MCHC: 29.1 g/dL — ABNORMAL LOW (ref 30.0–36.0)
MCV: 100.4 fL — ABNORMAL HIGH (ref 80.0–100.0)
Platelets: 118 10*3/uL — ABNORMAL LOW (ref 150–400)
RBC: 2.81 MIL/uL — ABNORMAL LOW (ref 3.87–5.11)
RDW: 18.4 % — ABNORMAL HIGH (ref 11.5–15.5)
WBC: 6.7 10*3/uL (ref 4.0–10.5)
nRBC: 0 % (ref 0.0–0.2)

## 2020-01-31 LAB — HEMOGLOBIN AND HEMATOCRIT, BLOOD
HCT: 27.3 % — ABNORMAL LOW (ref 36.0–46.0)
Hemoglobin: 8.2 g/dL — ABNORMAL LOW (ref 12.0–15.0)

## 2020-01-31 LAB — TYPE AND SCREEN
ABO/RH(D): O POS
Antibody Screen: NEGATIVE
Unit division: 0

## 2020-01-31 LAB — BPAM RBC
Blood Product Expiration Date: 202110082359
ISSUE DATE / TIME: 202109062233
Unit Type and Rh: 5100

## 2020-01-31 LAB — MRSA PCR SCREENING: MRSA by PCR: NEGATIVE

## 2020-01-31 SURGERY — ESOPHAGOGASTRODUODENOSCOPY (EGD) WITH PROPOFOL
Anesthesia: Monitor Anesthesia Care

## 2020-01-31 MED ORDER — CHLORHEXIDINE GLUCONATE CLOTH 2 % EX PADS
6.0000 | MEDICATED_PAD | Freq: Every day | CUTANEOUS | Status: DC
  Administered 2020-01-31: 6 via TOPICAL

## 2020-01-31 MED ORDER — ACETAMINOPHEN 325 MG PO TABS
650.0000 mg | ORAL_TABLET | Freq: Four times a day (QID) | ORAL | Status: DC | PRN
  Administered 2020-01-31 (×2): 650 mg via ORAL
  Filled 2020-01-31 (×2): qty 2

## 2020-01-31 MED ORDER — DOXERCALCIFEROL 4 MCG/2ML IV SOLN
3.0000 ug | INTRAVENOUS | Status: DC
  Filled 2020-01-31 (×2): qty 2

## 2020-01-31 MED ORDER — SODIUM CHLORIDE 0.9 % IV SOLN
510.0000 mg | Freq: Once | INTRAVENOUS | Status: AC
  Administered 2020-01-31: 510 mg via INTRAVENOUS
  Filled 2020-01-31: qty 17

## 2020-01-31 MED ORDER — ALBUTEROL SULFATE (2.5 MG/3ML) 0.083% IN NEBU
2.5000 mg | INHALATION_SOLUTION | Freq: Two times a day (BID) | RESPIRATORY_TRACT | Status: DC
  Administered 2020-01-31 – 2020-02-03 (×5): 2.5 mg via RESPIRATORY_TRACT
  Filled 2020-01-31 (×7): qty 3

## 2020-01-31 MED ORDER — PEG 3350-KCL-NA BICARB-NACL 420 G PO SOLR
4000.0000 mL | Freq: Once | ORAL | Status: AC
  Administered 2020-01-31: 4000 mL via ORAL
  Filled 2020-01-31 (×2): qty 4000

## 2020-01-31 MED ORDER — SODIUM CHLORIDE 0.9 % IV SOLN: INTRAVENOUS | Status: DC

## 2020-01-31 MED ORDER — PROPOFOL 500 MG/50ML IV EMUL
INTRAVENOUS | Status: DC | PRN
  Administered 2020-01-31: 75 ug/kg/min via INTRAVENOUS

## 2020-01-31 MED ORDER — PROPOFOL 10 MG/ML IV BOLUS
INTRAVENOUS | Status: DC | PRN
  Administered 2020-01-31 (×2): 20 mg via INTRAVENOUS

## 2020-01-31 MED ORDER — LIDOCAINE 2% (20 MG/ML) 5 ML SYRINGE
INTRAMUSCULAR | Status: DC | PRN
  Administered 2020-01-31: 60 mg via INTRAVENOUS

## 2020-01-31 MED ORDER — HYDROCORTISONE NA SUCCINATE PF 100 MG IJ SOLR
50.0000 mg | Freq: Three times a day (TID) | INTRAMUSCULAR | Status: DC
  Administered 2020-01-31 – 2020-02-03 (×11): 50 mg via INTRAVENOUS
  Filled 2020-01-31 (×11): qty 2

## 2020-01-31 SURGICAL SUPPLY — 15 items

## 2020-01-31 NOTE — Plan of Care (Signed)
  Problem: Education: Goal: Knowledge of General Education information will improve Description: Including pain rating scale, medication(s)/side effects and non-pharmacologic comfort measures Outcome: Progressing   Problem: Clinical Measurements: Goal: Ability to maintain clinical measurements within normal limits will improve Outcome: Progressing   

## 2020-01-31 NOTE — Progress Notes (Signed)
Initial Nutrition Assessment  DOCUMENTATION CODES:   Non-severe (moderate) malnutrition in context of chronic illness  INTERVENTION:    When diet advanced after procedure, add snacks between meals and renal MVI daily.  NUTRITION DIAGNOSIS:   Moderate Malnutrition related to chronic illness (ESRD on HD) as evidenced by mild muscle depletion, mild fat depletion.  GOAL:   Patient will meet greater than or equal to 90% of their needs  MONITOR:   Diet advancement, PO intake, Labs, I & O's  REASON FOR ASSESSMENT:   Malnutrition Screening Tool    ASSESSMENT:   74 yo female admitted with acute GI bleed in the setting of anticoagulation (on Eliquis for hx of PE). PMH includes ESRD on HD, Crohn's DZ, lung lesions concerning for lung cancer, PE, SVT.   Patient reports that she was eating okay PTA, but her appetite was not great. She ate more on non-HD days than she did on HD days. She does not know her EDW as she says it changes on a daily basis. She states that she does not drink PO supplements because the magnesium in them gives her diarrhea. When she started HD she quickly lost a lot of weight > 30 lbs, with her lowest weight being ~97 lbs. She is now up to 115 lbs. Suspect weight fluctuations are related to fluid status with HD, however, patient does have some muscle and subcutaneous fat depletion, meeting criteria to moderate malnutrition. Our conversation was cut short due to preparation for EGD.   She is currently NPO, but ready to eat when she returns from the EGD.  Labs reviewed. BUN 56, Creat 10.69, iron 8  Medications reviewed and include calcitriol, Phoslo, Solu-cortef, Feraheme IV x 1.  Weight from yesterday entered at 115.5 kg, suspect it should have been 115.5 lbs (52.5 kg), as her usual weight is ~50-52 kg. No recent weight changes apparent from review of usual weights.    NUTRITION - FOCUSED PHYSICAL EXAM:    Most Recent Value  Orbital Region Mild depletion   Upper Arm Region Mild depletion  Thoracic and Lumbar Region Moderate depletion  Buccal Region Mild depletion  Temple Region Mild depletion  Clavicle Bone Region Moderate depletion  Clavicle and Acromion Bone Region Moderate depletion  Scapular Bone Region Mild depletion  Dorsal Hand Mild depletion  Patellar Region Mild depletion  Anterior Thigh Region Mild depletion  Posterior Calf Region Mild depletion  Edema (RD Assessment) Mild  Hair Reviewed  Eyes Reviewed  Mouth Reviewed  Skin Reviewed  Nails Reviewed       Diet Order:   Diet Order            Diet NPO time specified Except for: Sips with Meds, Ice Chips  Diet effective now                 EDUCATION NEEDS:   Not appropriate for education at this time  Skin:  Skin Assessment: Reviewed RN Assessment  Last BM:  9/6  Height:   Ht Readings from Last 1 Encounters:  01/30/20 5' 4"  (1.626 m)    Weight:   Wt Readings from Last 1 Encounters:  01/30/20 115.5 kg   Should be 115.5 lbs = 52.5 kg  Ideal Body Weight:  54.5 kg  BMI:  19.86  Estimated Nutritional Needs:   Kcal:  1575-1840  Protein:  80-95 gm  Fluid:  1 L + UOP    Natasha [name], RD, LDN, CNSC Please refer to Amion for contact information.

## 2020-01-31 NOTE — Progress Notes (Addendum)
Patient given ferumoxytol (Feraheme) IV  510 mg @ 1055 over 15 mins. No order in [name] for NaCl 0.9% 100 mL IVPB so couldn't scan bag for infusion. Informed nurse. Patient tolerated infusion well. No hypersensitivity reactions. Flushed with NS before/after then saline locked. Vitals taken once infusion complete and WNL.

## 2020-01-31 NOTE — Progress Notes (Signed)
PROGRESS NOTE  Natasha [name] VUY:[id number] DOB: 05-28-1945 DOA: 01/30/2020 PCP: [name], MD  HPI/Recap of past 24 hours: HPI from [name] MIRZA [name] is a Natasha [name] on M/W/F, Crohn's disease on Humira and prednisone, lung lesions concerning for lung cancer, anemia thrombocytopenia SVT history of PE on Eliquis presents to the ER because of increasing weakness which is ongoing for last week.  Denies chest pain but has been exertional shortness of breath.  Patient also noticed some black stools over the last 1 week.  Had some vague abdominal pain around the periumbilical area no vomiting or diarrhea.  Given the symptoms patient presents to the ER. In the ER patient was not febrile, chronically on home O2 3-4 L due to COPD.  Chest x-ray shows lesions concerning for metastasis.  EKG shows normal sinus rhythm with LVH.  Patient's labs were significant for hemoglobin dropping from 11.2 -6.8.  Stool for occult blood was melanotic and positive.  Patient was ordered 1 unit of PRBC and admitted for acute GI bleed.  Covid test is negative.     Today, patient reports last black stool was last night, none this a.m, no hematochezia, patient denied any abdominal pain, nausea/vomiting, fever/chills, chest pain, worsening shortness of breath, fever/chills.  Still reports being overall fatigued.    Assessment/Plan: Principal Problem:   Acute GI bleeding Active Problems:   Crohn's disease without complication (HCC)   Hypothyroidism   Emphysema/COPD (HCC)   Chronic diastolic CHF (congestive heart failure) (HCC)   Hx of Pulmonary embolism (HCC)   Metastatic lung carcinoma, left (HCC)   Acute blood loss anemia   Possible upper GI bleed Acute on chronic blood loss anemia/iron def anemia Noted melena, FOBT positive Hemoglobin dropped from 11--> 6.8 Anemia panel showed iron 8, saturation 3 S/p 1 unit of PRBC on 01/30/2020 and Feraheme on  01/31/2020 [facility] consulted, plan for EGD on 01/31/2020 Continue IV Protonix Serial CBC, monitor closely  ESRD on M/W/F Nephrology consulted  History of Crohn's disease Continue IV steroids for now Hold home Humira, p.o. steroids [facility] consulted  Chronic hypoxic respiratory failure History of COPD Chronically on home O2 3 to 4 L Continue inhalers, nebs Continue supplemental oxygen  Paroxysmal A. Fib History of SVT Hold Cardizem, Eliquis for now  History of PE and DVT As per the patient occurred years ago  History of hypothyroidism Continue Synthroid  History of possible metastatic lung CA Not a candidate for bronchoscopy Continue follow-up with oncology and pulmonology        Malnutrition Type:      Malnutrition Characteristics:      Nutrition Interventions:       Estimated body mass index is 43.71 kg/m as calculated from the following:   Height as of this encounter: 5' 4"  (1.626 m).   Weight as of this encounter: 115.5 kg.     Code Status: Full  Family Communication: Discussed with patient at bedside  Disposition Plan: Status is: Inpatient  Remains inpatient appropriate because:Inpatient level of care appropriate due to severity of illness   Dispo: The patient is from: Home              Anticipated d/c is to: Home              Anticipated d/c date is: 2 days              Patient currently is not medically stable to  d/c.     Consultants:  GI  Nephrology  Procedures:  EGD on 01/31/2020  Antimicrobials:  None  DVT prophylaxis: SCDs for now    Objective: Vitals:   01/31/20 0600 01/31/20 0859 01/31/20 0935 01/31/20 1023  BP: 117/64 124/62  116/63  Pulse: 81 84  81  Resp: 16 18  16   Temp: 97.9 F (36.6 C) (!) 97.5 F (36.4 C)  98.8 F (37.1 C)  TempSrc: Oral Oral  Oral  SpO2: 100% 100% 97% 100%  Weight:      Height:        Intake/Output Summary (Last 24 hours) at 01/31/2020 1103 Last data filed at 01/31/2020  0600 Gross per 24 hour  Intake 420 ml  Output --  Net 420 ml   Filed Weights   01/30/20 1647  Weight: 115.5 kg    Exam:  General: NAD   Cardiovascular: S1, S2 present  Respiratory: CTAB  Abdomen: Soft, nontender, nondistended, bowel sounds present  Musculoskeletal: No bilateral pedal edema noted  Skin: Normal  Psychiatry: Normal mood   Data Reviewed: CBC: Recent Labs  Lab 01/30/20 1741 01/30/20 1810 01/30/20 1811 01/31/20 0557  WBC 5.0  --   --  6.7  NEUTROABS 3.6  --   --   --   HGB 6.8* 7.5* 7.5* 8.2*  8.2*  HCT 23.2* 22.0* 22.0* 28.2*  27.3*  MCV 104.0*  --   --  100.4*  PLT 126*  --   --  081*   Basic Metabolic Panel: Recent Labs  Lab 01/30/20 1741 01/30/20 1810 01/30/20 1811 01/31/20 0557  NA 134* 135 135 137  K 4.5 4.5 4.4 3.9  CL 102  --  103 104  CO2 18*  --   --  17*  GLUCOSE 106*  --  96 72  BUN 52*  --  53* 56*  CREATININE 10.05*  --  11.10* 10.69*  CALCIUM 9.0  --   --  8.6*   GFR: Estimated Creatinine Clearance: 5.8 mL/min (A) (by C-G formula based on SCr of 10.69 mg/dL (H)). Liver Function Tests: No results for input(s): AST, ALT, ALKPHOS, BILITOT, PROT, ALBUMIN in the last 168 hours. No results for input(s): LIPASE, AMYLASE in the last 168 hours. No results for input(s): AMMONIA in the last 168 hours. Coagulation Profile: No results for input(s): INR, PROTIME in the last 168 hours. Cardiac Enzymes: No results for input(s): CKTOTAL, CKMB, CKMBINDEX, TROPONINI in the last 168 hours. BNP (last 3 results) No results for input(s): PROBNP in the last 8760 hours. HbA1C: No results for input(s): HGBA1C in the last 72 hours. CBG: No results for input(s): GLUCAP in the last 168 hours. Lipid Profile: No results for input(s): CHOL, HDL, LDLCALC, TRIG, CHOLHDL, LDLDIRECT in the last 72 hours. Thyroid Function Tests: No results for input(s): TSH, T4TOTAL, FREET4, T3FREE, THYROIDAB in the last 72 hours. Anemia Panel: Recent Labs     01/30/20 2106  VITAMINB12 1,062*  FOLATE 7.8  FERRITIN 1,192*  TIBC 269  IRON 8*  RETICCTPCT 1.9   Urine analysis:    Component Value Date/Time   COLORURINE YELLOW 11/18/2019 2138   APPEARANCEUR CLEAR 11/18/2019 2138   LABSPEC 1.019 11/18/2019 2138   PHURINE 6.0 11/18/2019 2138   GLUCOSEU 50 (A) 11/18/2019 2138   HGBUR MODERATE (A) 11/18/2019 2138   BILIRUBINUR NEGATIVE 11/18/2019 2138   [place] NEGATIVE 11/18/2019 2138   PROTEINUR 30 (A) 11/18/2019 2138   UROBILINOGEN 0.2 09/09/2014 0850   NITRITE NEGATIVE 11/18/2019  2138   LEUKOCYTESUR NEGATIVE 11/18/2019 2138   Sepsis Labs: @LABRCNTIP (procalcitonin:4,lacticidven:4)  ) Recent Results (from the past 240 hour(s))  SARS Coronavirus 2 by RT PCR (hospital order, performed in [facility] hospital lab) Nasopharyngeal Nasopharyngeal Swab     Status: None   Collection Time: 01/30/20  5:37 PM   Specimen: Nasopharyngeal Swab  Result Value Ref Range Status   SARS Coronavirus 2 NEGATIVE NEGATIVE Final    Comment: (NOTE) SARS-CoV-2 target nucleic acids are NOT DETECTED.  The SARS-CoV-2 RNA is generally detectable in upper and lower respiratory specimens during the acute phase of infection. The lowest concentration of SARS-CoV-2 viral copies this assay can detect is 250 copies / mL. A negative result does not preclude SARS-CoV-2 infection and should not be used as the sole basis for treatment or other patient management decisions.  A negative result may occur with improper specimen collection / handling, submission of specimen other than nasopharyngeal swab, presence of viral mutation(s) within the areas targeted by this assay, and inadequate number of viral copies (<250 copies / mL). A negative result must be combined with clinical observations, patient history, and epidemiological information.  Fact Sheet for Patients:   StrictlyIdeas.no  Fact Sheet for Healthcare  Providers: BankingDealers.co.za  This test is not yet approved or  cleared by the [place] FDA and has been authorized for detection and/or diagnosis of SARS-CoV-2 by FDA under an Emergency Use Authorization (EUA).  This EUA will remain in effect (meaning this test can be used) for the duration of the COVID-19 declaration under Section 564(b)(1) of the Act, 21 U.S.C. section 360bbb-3(b)(1), unless the authorization is terminated or revoked sooner.  Performed at [facility], [place] [address], [place], [place] [address]   MRSA PCR Screening     Status: None   Collection Time: 01/31/20  1:46 AM   Specimen: Nasopharyngeal  Result Value Ref Range Status   MRSA by PCR NEGATIVE NEGATIVE Final    Comment:        The GeneXpert MRSA Assay (FDA approved for NASAL specimens only), is one component of a comprehensive MRSA colonization surveillance program. It is not intended to diagnose MRSA infection nor to guide or monitor treatment for MRSA infections. Performed at [facility], [place] [address], [place], [place] [address]       Studies: DG Chest Port 1 View  Result Date: 01/30/2020 CLINICAL DATA:  Shortness of breath.  COPD.  On dialysis. EXAM: PORTABLE CHEST 1 VIEW COMPARISON:  11/18/2019 plain film and CT. FINDINGS: Hyperinflation. Left axillary vascular stent. Surgical clips in the lower neck. Mild cardiomegaly. Atherosclerosis in the transverse aorta. Left perihilar lung nodule has enlarged, 2.8 cm today versus 2.1 cm on 11/18/2019. No pleural effusion or pneumothorax. Numerous leads and wires project over the chest. No lobar consolidation. IMPRESSION: Hyperinflation, without acute superimposed disease. Aortic Atherosclerosis (ICD10-I70.0). Enlarging left upper lobe lung nodule, as on prior exams (metastatic disease per CT of 11/18/2019). Electronically Signed   By: Abigail Miyamoto M.D.   On: 01/30/2020 17:30    Scheduled Meds: . albuterol  2.5 mg  Inhalation BID  . calcium acetate  667 mg Oral TID WC  . Chlorhexidine Gluconate Cloth  6 each Topical Q0600  . [START ON 02/01/2020] doxercalciferol  3 mcg Intravenous Q M,W,F-HD  . fluticasone furoate-vilanterol  1 puff Inhalation Daily  . hydrocortisone sod succinate (SOLU-CORTEF) inj  50 mg Intravenous Q8H  . levothyroxine  125 mcg Oral Q0600  . midodrine  10 mg Oral TID WC  . pantoprazole (PROTONIX) IV  40 mg Intravenous Q12H  . umeclidinium bromide  1 puff Inhalation Daily    Continuous Infusions: . ferumoxytol       LOS: 1 day     Alma Friendly, MD [facility]  If 7PM-7AM, please contact night-coverage [email or web address] 01/31/2020, 11:03 AM

## 2020-01-31 NOTE — Transfer of Care (Signed)
Immediate Anesthesia Transfer of Care Note  Patient: Natasha [name]  Procedure(s) Performed: ESOPHAGOGASTRODUODENOSCOPY (EGD) WITH PROPOFOL (N/A ) BIOPSY  Patient Location: Endoscopy Unit  Anesthesia Type:MAC  Level of Consciousness: awake, alert  and oriented  Airway & Oxygen Therapy: Patient Spontanous Breathing and Patient connected to nasal cannula oxygen  Post-op Assessment: Report given to RN and Post -op Vital signs reviewed and stable  Post vital signs: Reviewed and stable  Last Vitals:  Vitals Value Taken Time  BP 157/51 01/31/20 1409  Temp    Pulse 81 01/31/20 1410  Resp 16 01/31/20 1410  SpO2 99 % 01/31/20 1410  Vitals shown include unvalidated device data.  Last Pain:  Vitals:   01/31/20 1254  TempSrc: Oral  PainSc: 0-No pain         Complications: No complications documented.

## 2020-01-31 NOTE — Progress Notes (Addendum)
Pt. was transferred off unit for EGD.

## 2020-01-31 NOTE — Progress Notes (Signed)
NaCl 0.9% was infused @ 20 mL/hr after administration of ferumoxytol (Feraheme). Manually set up pump. Updated MAR with this information.

## 2020-01-31 NOTE — Consult Note (Signed)
Referring Provider: [place] Primary Care Physician:  [name], MD Primary Gastroenterologist:  Dr. Michail Sermon  Reason for Consultation: Possible GI bleed  HPI: Natasha [name] is a 74 y.o. female with past medical history of pulmonary embolism on Eliquis, history of Crohn's disease, history of end-stage renal disease on dialysis, history of COPD on home oxygen, presented to the hospital yesterday with shortness of breath of few days duration.  Upon initial evaluation, she was found to have hemoglobin of 6.8.  GI is  consulted for further evaluation.  Her previous hemoglobin was in range of 10-11.  Patient with history of Crohn's disease of small  intestine initially diagnosed in 1978,s/p surgery with ileocolonic anastomosis.  Now with perianal fistula.  Currently on Humira.  Patient seen and examined at bedside.  She has been having intermittent charcoal colored stool for last 2 months.  Also complaining of right sided abdominal discomfort.  Complaining of nausea but denies any vomiting.  Also complaining of worsening diarrhea for last 2 months.  Denies any fresh blood in the stool.   Colonoscopy in October 2019 by Dr. Michail Sermon showed ileocolonic anastomosis, evidence of Crohn's disease and ileum, normal mucosa from rectum to transverse colon and perianal fistula.  She was also found to have a small polyp in the descending colon, biopsy showed polypoid mucosa with low-grade dysplasia.  Past Medical History:  Diagnosis Date  . Anal fistula   . Anemia in chronic kidney disease (CKD)   . Aortic insufficiency    Echo 3/18: mod conc LVH, EF 60-65, no RWMA, Gr 1 DD, mod AI, mild MR, normal RVSF, Trivial TR  . Arthritis   . Borderline hypertension   . Bulging disc    L3-L4  . Chronic diarrhea    due to crohn's  . CKD (chronic kidney disease), stage IV ([place])    MWF- [address]  . Crohn's disease ([place])    chronic ileitis  . Dyspnea    with exertion  . Emphysema/COPD ([place])   . GERD  (gastroesophageal reflux disease)    denies  . History of blood transfusion   . History of glaucoma   . History of kidney stones   . History of pancreatitis    2008--  mercaptopurine  . History of small bowel obstruction    12-03-2010  due to crohn's ileitis  . Hypertension    no longer on medications  . Hypothyroidism, postsurgical    multinodule w/ hurthle cells  . Osteoporosis   . PAF (paroxysmal atrial fibrillation) ([place])   . Perianal Crohn's disease ([place])   . Peripheral vascular disease ([place])    blood clot behind knee left leg  . Polyarthralgia   . Seizures ([place])    03/2017  . Wears partial dentures    upper    Past Surgical History:  Procedure Laterality Date  . ABDOMINAL HYSTERECTOMY  1990   and  Appendectomy  . AV FISTULA PLACEMENT Left 12/08/2017   Procedure: INSERTION OF ARTERIOVENOUS (AV) GRAFT WITH ARTEGRAFT TO LEFT UPPER ARM;  Surgeon: Conrad [place], MD;  Location: [facility];  Service: Vascular;  Laterality: Left;  . BASCILIC VEIN TRANSPOSITION Left 01/09/2017   Procedure: LEFT 1ST STAGE BRACHIAL VEIN TRANSPOSITION;  Surgeon: Conrad [place], MD;  Location: [facility];  Service: Vascular;  Laterality: Left;  . BASCILIC VEIN TRANSPOSITION Left 06/16/2017   Procedure: Second Stage BASILIC VEIN TRANSPOSITION  LEFT ARM;  Surgeon: Conrad [place], MD;  Location: [facility];  Service: Vascular;  Laterality: Left;  . BIOPSY  03/09/2018   Procedure: BIOPSY;  Surgeon: Wilford Corner, MD;  Location: [facility];  Service: Endoscopy;;  . CHOLECYSTECTOMY    . COLON RESECTION  x3 --  1978,  1987, 1989   ILEAL RESECTION x2/   [place]  . COLONOSCOPY WITH PROPOFOL N/A 09/25/2014   Procedure: COLONOSCOPY WITH PROPOFOL;  Surgeon: Garlan Fair, MD;  [facility]: [facility];  Service: Endoscopy;  Laterality: N/A;  . COLONOSCOPY WITH PROPOFOL N/A 03/09/2018   Procedure: COLONOSCOPY WITH PROPOFOL;  Surgeon: Wilford Corner, MD;  [facility];  Service: Endoscopy;   Laterality: N/A;  . CYSTOSCOPY W/ URETERAL STENT PLACEMENT Right 11/19/2016   Procedure: CYSTOSCOPY WITH RIGHT RETROGRADE PYELOGRAM/ URETEROSCOPY WITH LASER AND RIGHT URETERAL STENT PLACEMENT;  Surgeon: Ardis Hughs, MD;  [facility];  Service: Urology;  Laterality: Right;  . ESOPHAGOGASTRODUODENOSCOPY  03/04/2012   Procedure: ESOPHAGOGASTRODUODENOSCOPY (EGD);  Surgeon: Garlan Fair, MD;  Location: [facility];  Service: Endoscopy;  Laterality: N/A;  . EXAMINATION UNDER ANESTHESIA N/A 09/12/2014   Procedure: EXAM UNDER ANESTHESIA;  Surgeon: Rolm Bookbinder, MD;  Location: [facility];  Service: General;  Laterality: N/A;  . EYE SURGERY     laser  . FLEXIBLE SIGMOIDOSCOPY  03/04/2012   Procedure: FLEXIBLE SIGMOIDOSCOPY;  Surgeon: Garlan Fair, MD;  [facility];  Service: Endoscopy;  Laterality: N/A;  . GLAUCOMA SURGERY Bilateral   . HOLMIUM LASER APPLICATION Right 8/85/0277   Procedure: HOLMIUM LASER APPLICATION;  Surgeon: Ardis Hughs, MD;  [facility]: [facility];  Service: Urology;  Laterality: Right;  . INCISION AND DRAINAGE PERIRECTAL ABSCESS N/A 09/12/2014   Procedure: IRRIGATION AND DEBRIDEMENT PERIRECTAL ABSCESS;  Surgeon: Rolm Bookbinder, MD;  Location: [facility];  Service: General;  Laterality: N/A;  . IR FLUORO GUIDE CV LINE RIGHT  03/20/2017  . IR US GUIDE VASC ACCESS RIGHT  03/20/2017  . NEGATIVE SLEEP STUDY  2008  . PLACEMENT OF SETON N/A 12/01/2014   Procedure: PLACEMENT OF SETON;  Surgeon: [name], MD;  Location[facility];  Service: General;  Laterality: N/A;  . POLYPECTOMY  03/09/2018   Procedure: POLYPECTOMY;  Surgeon: Wilford Corner, MD;  [facility];  Service: Endoscopy;;  . RECTAL EXAM UNDER ANESTHESIA N/A 12/01/2014   Procedure: RECTAL EXAM UNDER ANESTHESIA;  Surgeon: [name], MD;  Location: [facility];  Service: General;  Laterality: N/A;  . TOTAL THYROIDECTOMY  10-13-2003  . TRANSTHORACIC  ECHOCARDIOGRAM  07-11-2013   mild LVH,  ef 55%,  moderate AR,  mild MR and TR,  trivial PR  . TUBAL LIGATION      Prior to Admission medications   Medication Sig Start Date End Date Taking? Authorizing Provider  acetaminophen (TYLENOL) 500 MG tablet Take 1,000 mg by mouth every 6 (six) hours as needed for mild pain.    Yes [provider]  Adalimumab (HUMIRA) 40 MG/0.8ML PSKT Inject 40 mg into the skin every 14 (fourteen) days.    Yes [provider]  albuterol (VENTOLIN HFA) 108 (90 Base) MCG/ACT inhaler INHALE 2 PUFFS BY MOUTH EVERY 6 HOURS IF NEEDED FOR WHEEZING OR SHORTNESS OF BREATH 05/31/19  Yes Chesley Mires, MD  calcitRIOL (ROCALTROL) 0.25 MCG capsule Take 0.25 mcg by mouth every Monday, Wednesday, and Friday with hemodialysis. 02/03/17  Yes [provider]  calcium acetate (PHOSLO) 667 MG capsule Take 1 capsule (667 mg total) by mouth 3 (three) times daily with meals. 06/06/18  Yes  Domenic Polite, MD  calcium-vitamin D (OSCAL WITH D) 500-200 MG-UNIT TABS tablet Take 1 tablet by mouth as needed (with dialysis).    Yes [provider]  cyanocobalamin (,VITAMIN B-12,) 1000 MCG/ML injection Inject 1,000 mcg into the muscle every 30 (thirty) days.    Yes [provider]  diltiazem (CARDIZEM) 30 MG tablet Take 1 tablet (30 mg total) by mouth every 8 (eight) hours. 01/24/20  Yes Nahser, Wonda Cheng, MD  ELIQUIS 5 MG TABS tablet Take 2.5 mg by mouth 2 (two) times daily. Takes 1/2 tablet (2.30m) bid 08/23/18  Yes [provider]  fluticasone (FLONASE) 50 MCG/ACT nasal spray Place 1 spray into both nostrils daily as needed for allergies.  03/19/19  Yes [provider]  Fluticasone-Umeclidin-Vilant (TRELEGY ELLIPTA) 100-62.5-25 MCG/INH AEPB Inhale 1 puff into the lungs daily. 01/17/20  Yes WMartyn Ehrich NP  ipratropium-albuterol (DUONEB) 0.5-2.5 (3) MG/3ML SOLN Take 3 mLs by nebulization every 6 (six) hours as needed. Patient taking  differently: Take 3 mLs by nebulization every 6 (six) hours as needed (sob/wheezing).  12/30/18  Yes NFenton Foy NP  levothyroxine (SYNTHROID, LEVOTHROID) 125 MCG tablet Take 125 mcg by mouth daily before breakfast.    Yes [provider]  lidocaine-prilocaine (EMLA) cream Apply 1 application topically every Monday, Wednesday, and Friday.  11/29/18  Yes [provider]  loperamide (IMODIUM) 2 MG capsule Take 2 capsules (4 mg total) by mouth as needed for diarrhea or loose stools. 03/27/17  Yes Gherghe, CVella Redhead MD  Methoxy PEG-Epoetin Beta (MIRCERA IJ) Inject 150 mg as directed every 14 (fourteen) days. 06/13/19 06/11/20 Yes [provider]  midodrine (PROAMATINE) 10 MG tablet Take 1 tablet (10 mg total) by mouth 3 (three) times daily with meals. 01/24/20  Yes Nahser, PWonda Cheng MD  nitroGLYCERIN (NITROSTAT) 0.4 MG SL tablet Place 0.4 mg under the tongue every 5 (five) minutes as needed for chest pain.   Yes [provider]  ondansetron (ZOFRAN ODT) 4 MG disintegrating tablet Take 1 tablet (4 mg total) by mouth every 8 (eight) hours as needed for nausea or vomiting. 03/29/18  Yes FDeno Etienne DO  predniSONE (DELTASONE) 5 MG tablet Take 5 mg by mouth daily with breakfast.   Yes [provider]  Fluticasone-Umeclidin-Vilant (TRELEGY ELLIPTA) 100-62.5-25 MCG/INH AEPB Inhale 1 puff into the lungs daily. Patient not taking: Reported on 01/30/2020 07/25/19   SChesley Mires MD  nitroGLYCERIN (NITROSTAT) 0.4 MG SL tablet Place 1 tablet (0.4 mg total) under the tongue every 5 (five) minutes as needed for chest pain. 05/03/19 12/27/19  Nahser, PWonda Cheng MD  OXYGEN Inhale 2-3 L into the lungs See admin instructions. Use every night and during the day as needed for shortness of breath    [provider]  predniSONE (DELTASONE) 10 MG tablet Take 236mx 3 days, then 1511m 3 days, then 28m61m3 days, then resume daily 5 mg Patient not taking: Reported on 01/30/2020 01/19/20    SoodChesley Mires  Respiratory Therapy Supplies (NEBULIZER) DEVI 1 Device by Does not apply route as needed. 12/07/18   NichFenton Foy    Scheduled Meds: . albuterol  2.5 mg Inhalation BID  . calcium acetate  667 mg Oral TID WC  . Chlorhexidine Gluconate Cloth  6 each Topical Q0600  . [START ON 02/01/2020] doxercalciferol  3 mcg Intravenous Q M,W,F-HD  . fluticasone furoate-vilanterol  1 puff Inhalation Daily  . hydrocortisone sod succinate (SOLU-CORTEF) inj  50 mg Intravenous Q8H  . levothyroxine  125 mcg Oral Q0600  . midodrine  10 mg Oral TID WC  . pantoprazole (PROTONIX) IV  40 mg Intravenous Q12H  . umeclidinium bromide  1 puff Inhalation Daily   Continuous Infusions: . ferumoxytol     PRN Meds:.ipratropium-albuterol, nitroGLYCERIN, ondansetron **OR** ondansetron (ZOFRAN) IV  Allergies as of 01/30/2020 - Review Complete 01/30/2020  Allergen Reaction Noted  . Mercaptopurine Other (See Comments) 03/04/2012  . Remicade [infliximab] Other (See Comments) 03/04/2012    Family History  Problem Relation Age of Onset  . Hypertension Father   . Heart disease Father   . Heart attack Father   . Diverticulitis Mother   . COPD Mother   . Hypertension Mother   . Heart disease Mother   . Heart attack Mother   . Heart attack Brother   . Colon cancer Brother   . Diabetes Brother   . Thyroid disease Brother     Social History   Socioeconomic History  . Marital status: Married    Spouse name: [name]  . Number of children: 3  . Years of education: 36  . Highest education level: Not on file  Occupational History  . Occupation: Retired    Fish farm manager: RETIRED  Tobacco Use  . Smoking status: Former Smoker    Packs/day: 1.00    Years: 35.00    Pack years: 35.00    Types: Cigarettes    Start date: 02/22/1967    Quit date: 11/27/2017    Years since quitting: 2.1  . Smokeless tobacco: Never Used  Vaping Use  . Vaping Use: Never used  Substance and Sexual Activity  . Alcohol  use: No  . Drug use: No  . Sexual activity: Never  Other Topics Concern  . Not on file  Social History Narrative   Patient is married [name]) and lives at home with her husband.   Patient has three children.   Patient is retired on disability.   Patient drinks two cups of caffeine daily.   Patient is right-handed.         Social Determinants of Health   Financial Resource Strain:   . Difficulty of Paying Living Expenses: Not on file  Food Insecurity:   . Worried About Charity fundraiser in the Last Year: Not on file  . Ran Out of Food in the Last Year: Not on file  Transportation Needs:   . Lack of Transportation (Medical): Not on file  . Lack of Transportation (Non-Medical): Not on file  Physical Activity:   . Days of Exercise per Week: Not on file  . Minutes of Exercise per Session: Not on file  Stress:   . Feeling of Stress : Not on file  Social Connections:   . Frequency of Communication with Friends and Family: Not on file  . Frequency of Social Gatherings with Friends and Family: Not on file  . Attends Religious Services: Not on file  . Active Member of Clubs or Organizations: Not on file  . Attends Archivist Meetings: Not on file  . Marital Status: Not on file  Intimate Partner Violence:   . Fear of Current or Ex-Partner: Not on file  . Emotionally Abused: Not on file  . Physically Abused: Not on file  . Sexually Abused: Not on file    Review of Systems: All negative except as stated above in HPI.  Physical Exam: Vital signs: Vitals:   01/31/20 0935 01/31/20 1023  BP:  116/63  Pulse:  81  Resp:  16  Temp:  98.8 F (37.1 C)  SpO2: 97% 100%   Last BM Date: 01/30/20 Physical Exam Vitals reviewed.  Constitutional:      General: She is not in acute distress.    Appearance: She is well-developed.  HENT:     Head: Normocephalic and atraumatic.  Eyes:     Extraocular Movements: Extraocular movements intact.  Cardiovascular:     Rate and  Rhythm: Normal rate and regular rhythm.     Heart sounds: Murmur heard.   Pulmonary:     Effort: Pulmonary effort is normal.     Breath sounds: Normal breath sounds.  Chest:     Chest wall: No deformity.  Abdominal:     General: Bowel sounds are normal.     Palpations: Abdomen is soft. There is no mass.     Tenderness: There is abdominal tenderness. There is no guarding or rebound.  Musculoskeletal:        General: Normal range of motion.     Cervical back: Normal range of motion.  Skin:    General: Skin is warm.  Neurological:     Mental Status: She is alert and oriented to person, place, and time.  Psychiatric:        Mood and Affect: Mood normal.        Behavior: Behavior normal.     GI:  Lab Results: Recent Labs    01/30/20 1741 01/30/20 1741 01/30/20 1810 01/30/20 1811 01/31/20 0557  WBC 5.0  --   --   --  6.7  HGB 6.8*   < > 7.5* 7.5* 8.2*  8.2*  HCT 23.2*   < > 22.0* 22.0* 28.2*  27.3*  PLT 126*  --   --   --  118*   < > = values in this interval not displayed.   BMET Recent Labs    01/30/20 1741 01/30/20 1741 01/30/20 1810 01/30/20 1811 01/31/20 0557  NA 134*   < > 135 135 137  K 4.5   < > 4.5 4.4 3.9  CL 102  --   --  103 104  CO2 18*  --   --   --  17*  GLUCOSE 106*  --   --  96 72  BUN 52*  --   --  53* 56*  CREATININE 10.05*  --   --  11.10* 10.69*  CALCIUM 9.0  --   --   --  8.6*   < > = values in this interval not displayed.   LFT No results for input(s): PROT, ALBUMIN, AST, ALT, ALKPHOS, BILITOT, BILIDIR, IBILI in the last 72 hours. PT/INR No results for input(s): LABPROT, INR in the last 72 hours.   Studies/Results: DG Chest Port 1 View  Result Date: 01/30/2020 CLINICAL DATA:  Shortness of breath.  COPD.  On dialysis. EXAM: PORTABLE CHEST 1 VIEW COMPARISON:  11/18/2019 plain film and CT. FINDINGS: Hyperinflation. Left axillary vascular stent. Surgical clips in the lower neck. Mild cardiomegaly. Atherosclerosis in the transverse  aorta. Left perihilar lung nodule has enlarged, 2.8 cm today versus 2.1 cm on 11/18/2019. No pleural effusion or pneumothorax. Numerous leads and wires project over the chest. No lobar consolidation. IMPRESSION: Hyperinflation, without acute superimposed disease. Aortic Atherosclerosis (ICD10-I70.0). Enlarging left upper lobe lung nodule, as on prior exams (metastatic disease per CT of 11/18/2019). Electronically Signed   By: Abigail Miyamoto M.D.   On: 01/30/2020 17:30  Impression/Plan: -Anemia with dark and heme positive stool. -Acute blood loss anemia.  Status post blood transfusion. -History of pulmonary embolism on Eliquis.  Currently on hold. 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  Currently on Humira. -End-stage renal disease on dialysis -COPD on home oxygen.  Currently not in exacerbation.  Recommendations ------------------------- -Proceed with EGD today.  If EGD negative, recommend colonoscopy for further evaluation given her history of Crohn's disease. -Continue supportive care for now. -Continue to hold anticoagulation -Transfuse as needed to keep hemoglobin around 8 -GI will follow  Risks (bleeding, infection, bowel perforation that could require surgery, sedation-related changes in cardiopulmonary systems), benefits (identification and possible treatment of source of symptoms, exclusion of certain causes of symptoms), and alternatives (watchful waiting, radiographic imaging studies, empiric medical treatment)  were explained to patient/family in detail and patient wishes to proceed.    LOS: 1 day   Otis Brace  MD, FACP 01/31/2020, 10:49 AM  Contact #  [phone]

## 2020-01-31 NOTE — Consult Note (Signed)
[facility] Renal Consultation Note    Indication for Consultation:  Management of ESRD/hemodialysis, anemia, hypertension/volume, and secondary hyperparathyroidism.  HPI: Natasha [name] is a 74 y.o. female with PMH including ESRD on dialysis MWF, crohn's disease, COPD, HTN, a. 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 Mohamed). Patient reports progressive weakness and SOB for about 2 weeks. Reports she was treated with low dose steroids for shortness of breath which did not  Improve her symptoms. Yesterday, she was walking to the car to go to HD and became so weak she decided to go home and rest, then ended up coming to the ED. On arrival, Hgb 6.8 and FOBT positive. Patient was given 1 unit PRBC and admitted for further work up of GI bleed. GI has been consulted. Nephrology has been consulted for management of ESRD.   Patient did miss dialysis yesterday and reports this is the first treatment she has missed in years. Reports poor PO intake past few days. SOB is slightly improved after transfusion, no orthopnea. Denies CP, palpitations, and dizziness. Reports fatigue. No nausea, vomiting, myoclonus or confusion. She prefers not to do dialysis two days in a row as it typically makes her feel very weak.   Past Medical History:  Diagnosis Date  . Anal fistula   . Anemia in chronic kidney disease (CKD)   . Aortic insufficiency    Echo 3/18: mod conc LVH, EF 60-65, no RWMA, Gr 1 DD, mod AI, mild MR, normal RVSF, Trivial TR  . Arthritis   . Borderline hypertension   . Bulging disc    L3-L4  . Chronic diarrhea    due to crohn's  . CKD (chronic kidney disease), stage IV ([place])    MWF- [address]  . Crohn's disease ([place])    chronic ileitis  . Dyspnea    with exertion  . Emphysema/COPD ([place])   . GERD (gastroesophageal reflux disease)    denies  . History of blood  transfusion   . History of glaucoma   . History of kidney stones   . History of pancreatitis    2008--  mercaptopurine  . History of small bowel obstruction    12-03-2010  due to crohn's ileitis  . Hypertension    no longer on medications  . Hypothyroidism, postsurgical    multinodule w/ hurthle cells  . Osteoporosis   . PAF (paroxysmal atrial fibrillation) ([place])   . Perianal Crohn's disease ([place])   . Peripheral vascular disease ([place])    blood clot behind knee left leg  . Polyarthralgia   . Seizures ([place])    03/2017  . Wears partial dentures    upper   Past Surgical History:  Procedure Laterality Date  . ABDOMINAL HYSTERECTOMY  1990   and  Appendectomy  . AV FISTULA PLACEMENT Left 12/08/2017   Procedure: INSERTION OF ARTERIOVENOUS (AV) GRAFT WITH ARTEGRAFT TO LEFT UPPER ARM;  Surgeon: Conrad Palatka, MD;  Location: [facility];  Service: Vascular;  Laterality: Left;  . BASCILIC VEIN TRANSPOSITION Left 01/09/2017   Procedure: LEFT 1ST STAGE BRACHIAL VEIN TRANSPOSITION;  Surgeon: Conrad Grantsburg, MD;  Location: [place];  Service: Vascular;  Laterality: Left;  . BASCILIC VEIN TRANSPOSITION Left 06/16/2017   Procedure: Second Stage BASILIC VEIN TRANSPOSITION  LEFT ARM;  Surgeon: Conrad Mirrormont, MD;  Location: [place];  Service: Vascular;  Laterality: Left;  . BIOPSY  03/09/2018   Procedure: BIOPSY;  Surgeon: Wilford Corner, MD;  [facility];  Service: Endoscopy;;  . CHOLECYSTECTOMY    . COLON RESECTION  x3 --  1978,  1987, 1989   ILEAL RESECTION x2/   [place]  . COLONOSCOPY WITH PROPOFOL N/A 09/25/2014   Procedure: COLONOSCOPY WITH PROPOFOL;  Surgeon: Garlan Fair, MD;  [facility];  Service: Endoscopy;  Laterality: N/A;  . COLONOSCOPY WITH PROPOFOL N/A 03/09/2018   Procedure: COLONOSCOPY WITH PROPOFOL;  Surgeon: Wilford Corner, MD;  [facility];  Service: Endoscopy;  Laterality: N/A;  . CYSTOSCOPY W/ URETERAL STENT PLACEMENT Right 11/19/2016    Procedure: CYSTOSCOPY WITH RIGHT RETROGRADE PYELOGRAM/ URETEROSCOPY WITH LASER AND RIGHT URETERAL STENT PLACEMENT;  Surgeon: Ardis Hughs, MD;  [facility];  Service: Urology;  Laterality: Right;  . ESOPHAGOGASTRODUODENOSCOPY  03/04/2012   Procedure: ESOPHAGOGASTRODUODENOSCOPY (EGD);  Surgeon: Garlan Fair, MD;  [facility];  Service: Endoscopy;  Laterality: N/A;  . EXAMINATION UNDER ANESTHESIA N/A 09/12/2014   Procedure: EXAM UNDER ANESTHESIA;  Surgeon: Rolm Bookbinder, MD;  Location: [place];  Service: General;  Laterality: N/A;  . EYE SURGERY     laser  . FLEXIBLE SIGMOIDOSCOPY  03/04/2012   Procedure: FLEXIBLE SIGMOIDOSCOPY;  Surgeon: Garlan Fair, MD;  [facility];  Service: Endoscopy;  Laterality: N/A;  . GLAUCOMA SURGERY Bilateral   . HOLMIUM LASER APPLICATION Right 11/23/1599   Procedure: HOLMIUM LASER APPLICATION;  Surgeon: Ardis Hughs, MD;  [facility];  Service: Urology;  Laterality: Right;  . INCISION AND DRAINAGE PERIRECTAL ABSCESS N/A 09/12/2014   Procedure: IRRIGATION AND DEBRIDEMENT PERIRECTAL ABSCESS;  Surgeon: Rolm Bookbinder, MD;  Location: [place] [facility];  Service: General;  Laterality: N/A;  . IR FLUORO GUIDE CV LINE RIGHT  03/20/2017  . IR US GUIDE VASC ACCESS RIGHT  03/20/2017  . NEGATIVE SLEEP STUDY  2008  . PLACEMENT OF SETON N/A 12/01/2014   Procedure: PLACEMENT OF SETON;  Surgeon: [name], MD;  Location: [facility];  Service: General;  Laterality: N/A;  . POLYPECTOMY  03/09/2018   Procedure: POLYPECTOMY;  Surgeon: Wilford Corner, MD;  [facility];  Service: Endoscopy;;  . RECTAL EXAM UNDER ANESTHESIA N/A 12/01/2014   Procedure: RECTAL EXAM UNDER ANESTHESIA;  Surgeon: [name], MD;  [facility];  Service: General;  Laterality: N/A;  . TOTAL THYROIDECTOMY  10-13-2003  . TRANSTHORACIC ECHOCARDIOGRAM  07-11-2013   mild LVH,  ef 55%,  moderate AR,  mild MR and  TR,  trivial PR  . TUBAL LIGATION     Family History  Problem Relation Age of Onset  . Hypertension Father   . Heart disease Father   . Heart attack Father   . Diverticulitis Mother   . COPD Mother   . Hypertension Mother   . Heart disease Mother   . Heart attack Mother   . Heart attack Brother   . Colon cancer Brother   . Diabetes Brother   . Thyroid disease Brother    Social History:  reports that she quit smoking about 2 years ago. Her smoking use included cigarettes. She started smoking about 52 years ago. She has a 35.00 pack-year smoking history. She has never used smokeless tobacco. She reports that she does not drink alcohol and does not use drugs.  ROS: As per HPI otherwise negative.   Physical Exam: Vitals:   01/31/20 0215 01/31/20 0600 01/31/20 0859 01/31/20 0935  BP:  123/62 117/64 124/62   Pulse: 79 81 84   Resp: 18 16 18    Temp: 98.6 F (37 C) 97.9 F (36.6 C) (!) 97.5 F (36.4 C)   TempSrc: Oral Oral Oral   SpO2: 98% 100% 100% 97%  Weight:      Height:         General: Well developed, well nourished, in no acute distress. Head: Normocephalic, atraumatic, sclera non-icteric, mucus membranes are moist. Neck: Supple without lymphadenopathy/masses. JVD not elevated. Lungs: On O2 via [place]. Clear bilaterally to auscultation without wheezes, rales, or rhonchi. Breathing is unlabored. Heart: RRR with normal S1, S2. No murmurs, rubs, or gallops appreciated. Abdomen: Soft, non-tender, non-distended with normoactive bowel sounds. No rebound/guarding. No obvious abdominal masses. Musculoskeletal:  Strength and tone appear normal for age. Lower extremities: No edema or ischemic changes, no open wounds. Neuro: Alert and oriented X 3. Moves all extremities spontaneously. Psych:  Responds to questions appropriately with a normal affect. Dialysis Access: LUE AVG + bruit  Allergies  Allergen Reactions  . Mercaptopurine Other (See Comments)    Caused pancreatitis  .  Remicade [Infliximab] Other (See Comments)    CAUSED JOINT PAIN   Prior to Admission medications   Medication Sig Start Date End Date Taking? Authorizing Provider  acetaminophen (TYLENOL) 500 MG tablet Take 1,000 mg by mouth every 6 (six) hours as needed for mild pain.    Yes [provider]  Adalimumab (HUMIRA) 40 MG/0.8ML PSKT Inject 40 mg into the skin every 14 (fourteen) days.    Yes [provider]  albuterol (VENTOLIN HFA) 108 (90 Base) MCG/ACT inhaler INHALE 2 PUFFS BY MOUTH EVERY 6 HOURS IF NEEDED FOR WHEEZING OR SHORTNESS OF BREATH 05/31/19  Yes Chesley Mires, MD  calcitRIOL (ROCALTROL) 0.25 MCG capsule Take 0.25 mcg by mouth every Monday, Wednesday, and Friday with hemodialysis. 02/03/17  Yes [provider]  calcium acetate (PHOSLO) 667 MG capsule Take 1 capsule (667 mg total) by mouth 3 (three) times daily with meals. 06/06/18  Yes Domenic Polite, MD  calcium-vitamin D (OSCAL WITH D) 500-200 MG-UNIT TABS tablet Take 1 tablet by mouth as needed (with dialysis).    Yes [provider]  cyanocobalamin (,VITAMIN B-12,) 1000 MCG/ML injection Inject 1,000 mcg into the muscle every 30 (thirty) days.    Yes [provider]  diltiazem (CARDIZEM) 30 MG tablet Take 1 tablet (30 mg total) by mouth every 8 (eight) hours. 01/24/20  Yes Nahser, Wonda Cheng, MD  ELIQUIS 5 MG TABS tablet Take 2.5 mg by mouth 2 (two) times daily. Takes 1/2 tablet (2.78m) bid 08/23/18  Yes [provider]  fluticasone (FLONASE) 50 MCG/ACT nasal spray Place 1 spray into both nostrils daily as needed for allergies.  03/19/19  Yes [provider]  Fluticasone-Umeclidin-Vilant (TRELEGY ELLIPTA) 100-62.5-25 MCG/INH AEPB Inhale 1 puff into the lungs daily. 01/17/20  Yes WMartyn Ehrich NP  ipratropium-albuterol (DUONEB) 0.5-2.5 (3) MG/3ML SOLN Take 3 mLs by nebulization every 6 (six) hours as needed. Patient taking differently: Take 3 mLs by nebulization every 6 (six)  hours as needed (sob/wheezing).  12/30/18  Yes NFenton Foy NP  levothyroxine (SYNTHROID, LEVOTHROID) 125 MCG tablet Take 125 mcg by mouth daily before breakfast.    Yes [provider]  lidocaine-prilocaine (EMLA) cream Apply 1 application topically every Monday, Wednesday, and Friday.  11/29/18  Yes [provider]  loperamide (IMODIUM) 2 MG capsule Take 2 capsules (4 mg total) by mouth as needed for  diarrhea or loose stools. 03/27/17  Yes Gherghe, Vella Redhead, MD  Methoxy PEG-Epoetin Beta (MIRCERA IJ) Inject 150 mg as directed every 14 (fourteen) days. 06/13/19 06/11/20 Yes [provider]  midodrine (PROAMATINE) 10 MG tablet Take 1 tablet (10 mg total) by mouth 3 (three) times daily with meals. 01/24/20  Yes Nahser, Wonda Cheng, MD  nitroGLYCERIN (NITROSTAT) 0.4 MG SL tablet Place 0.4 mg under the tongue every 5 (five) minutes as needed for chest pain.   Yes [provider]  ondansetron (ZOFRAN ODT) 4 MG disintegrating tablet Take 1 tablet (4 mg total) by mouth every 8 (eight) hours as needed for nausea or vomiting. 03/29/18  Yes Deno Etienne, DO  predniSONE (DELTASONE) 5 MG tablet Take 5 mg by mouth daily with breakfast.   Yes [provider]  Fluticasone-Umeclidin-Vilant (TRELEGY ELLIPTA) 100-62.5-25 MCG/INH AEPB Inhale 1 puff into the lungs daily. Patient not taking: Reported on 01/30/2020 07/25/19   Chesley Mires, MD  nitroGLYCERIN (NITROSTAT) 0.4 MG SL tablet Place 1 tablet (0.4 mg total) under the tongue every 5 (five) minutes as needed for chest pain. 05/03/19 12/27/19  Nahser, Wonda Cheng, MD  OXYGEN Inhale 2-3 L into the lungs See admin instructions. Use every night and during the day as needed for shortness of breath    [provider]  predniSONE (DELTASONE) 10 MG tablet Take 73m x 3 days, then 151mx 3 days, then 1061m 3 days, then resume daily 5 mg Patient not taking: Reported on 01/30/2020 01/19/20   SooChesley MiresD  Respiratory Therapy Supplies  (NEBULIZER) DEVI 1 Device by Does not apply route as needed. 12/07/18   NicFenton FoyP   Current Facility-Administered Medications  Medication Dose Route Frequency Provider Last Rate Last Admin  . albuterol (PROVENTIL) (2.5 MG/3ML) 0.083% nebulizer solution 2.5 mg  2.5 mg Inhalation BID KakRise PatienceD   2.5 mg at 01/31/20 0934  . [START ON 02/01/2020] calcitRIOL (ROCALTROL) capsule 0.25 mcg  0.25 mcg Oral Q M,W,F-HD KakRise PatienceD      . calcium acetate (PHOSLO) capsule 667 mg  667 mg Oral TID WC KakRise PatienceD      . ferumoxytol (FEImperial Health LLP10 mg in sodium chloride 0.9 % 100 mL IVPB  510 mg Intravenous Once EzeAlma FriendlyD      . fluticasone furoate-vilanterol (BREO ELLIPTA) 100-25 MCG/INH 1 puff  1 puff Inhalation Daily KakRise PatienceD   1 puff at 01/31/20 0935  . hydrocortisone sodium succinate (SOLU-CORTEF) 100 MG injection 50 mg  50 mg Intravenous Q8H KakRise PatienceD   50 mg at 01/31/20 0522  . ipratropium-albuterol (DUONEB) 0.5-2.5 (3) MG/3ML nebulizer solution 3 mL  3 mL Nebulization Q6H PRN KakRise PatienceD      . levothyroxine (SYNTHROID) tablet 125 mcg  125 mcg Oral Q0600 KakRise PatienceD   125 mcg at 01/31/20 0524  . midodrine (PROAMATINE) tablet 10 mg  10 mg Oral TID WC KakRise PatienceD   10 mg at 01/31/20 0848  . nitroGLYCERIN (NITROSTAT) SL tablet 0.4 mg  0.4 mg Sublingual Q5 min PRN KakRise PatienceD      . ondansetron (ZOPutnam Hospital Centerablet 4 mg  4 mg Oral Q6H PRN KakRise PatienceD       Or  . ondansetron (ZOTelecare El Dorado County Phfnjection 4 mg  4 mg Intravenous Q6H PRN KakRise PatienceD      . pantoprazole (PRODutch John  injection 40 mg  40 mg Intravenous Q12H Rise Patience, MD   40 mg at 01/31/20 0848  . umeclidinium bromide (INCRUSE ELLIPTA) 62.5 MCG/INH 1 puff  1 puff Inhalation Daily Rise Patience, MD   1 puff at 01/31/20 0935   Labs: Basic Metabolic Panel: Recent Labs  Lab  01/30/20 1741 01/30/20 1741 01/30/20 1810 01/30/20 1811 01/31/20 0557  NA 134*   < > 135 135 137  K 4.5   < > 4.5 4.4 3.9  CL 102  --   --  103 104  CO2 18*  --   --   --  17*  GLUCOSE 106*  --   --  96 72  BUN 52*  --   --  53* 56*  CREATININE 10.05*  --   --  11.10* 10.69*  CALCIUM 9.0  --   --   --  8.6*   < > = values in this interval not displayed.   CBC: Recent Labs  Lab 01/30/20 1741 01/30/20 1741 01/30/20 1810 01/30/20 1811 01/31/20 0557  WBC 5.0  --   --   --  6.7  NEUTROABS 3.6  --   --   --   --   HGB 6.8*   < > 7.5* 7.5* 8.2*  8.2*  HCT 23.2*   < > 22.0* 22.0* 28.2*  27.3*  MCV 104.0*  --   --   --  100.4*  PLT 126*  --   --   --  118*   < > = values in this interval not displayed.   Iron Studies:  Recent Labs    01/30/20 2106  IRON 8*  TIBC 269  FERRITIN 1,192*   Studies/Results: DG Chest Port 1 View  Result Date: 01/30/2020 CLINICAL DATA:  Shortness of breath.  COPD.  On dialysis. EXAM: PORTABLE CHEST 1 VIEW COMPARISON:  11/18/2019 plain film and CT. FINDINGS: Hyperinflation. Left axillary vascular stent. Surgical clips in the lower neck. Mild cardiomegaly. Atherosclerosis in the transverse aorta. Left perihilar lung nodule has enlarged, 2.8 cm today versus 2.1 cm on 11/18/2019. No pleural effusion or pneumothorax. Numerous leads and wires project over the chest. No lobar consolidation. IMPRESSION: Hyperinflation, without acute superimposed disease. Aortic Atherosclerosis (ICD10-I70.0). Enlarging left upper lobe lung nodule, as on prior exams (metastatic disease per CT of 11/18/2019). Electronically Signed   By: Abigail Miyamoto M.D.   On: 01/30/2020 17:30    Dialysis Orders:  Center: [facility]  on MWF. 180NRe, 4 hours, BFR 300, DFR 800, EDW 50kg, 3K/2.25 Ca, UF Prof 3, AVG 16g, heparin 4200 unit bolus No ESA due to likely malignancy Hectorol 3 mcg IV q HD Calcium acetate 2 caps TID with meals   Assessment/Plan: 1.  Acute on  chronic anemia: Hgb 6.8 on admission and FOBT +. Hx of Crohns and has had GI bleeds in the past. Not on ESA due to likely malignancy. Received 1 unit PRBC, Hgb now 8.2. Tsat 8%, ferritin 1192, already received dose of feraheme. 2.  ESRD:  Dialyzes on MWF schedule. No symptoms of uremia or volume overload today. K+ controlled. No absolute indications for HD today and patient prefers not to have dialysis two days in a row, will plan for next HD tomorrow and continue MWF schedule.  3.  Hypertension/volume: BP controlled, no volume overload on exam (weight here is inaccurate, might be lb rather than kg). Takes midodrine TID, will continue here. 4.  Anemia: See #1 above.  No ESA due to likely malignancy 5.  Metabolic bone disease: Calcium controlled. Continue hectorol. Resume calcium acetate binder once tolerating PO and follow phos. 6.  A fib: On eliquis, currently on hold. Per primary.  7. COPD: With possible mets on CT scan, followed by oncology and pulmonology. Breathing still not to baseline but this is more likely due to her anemia 8. Crohn's dz: On Humira, followed by GI  Anice Paganini, PA-C 01/31/2020, 9:56 AM  [facility] Pager: [phone]

## 2020-01-31 NOTE — Brief Op Note (Signed)
01/30/2020 - 01/31/2020  2:28 PM  PATIENT:  Natasha [name]  74 y.o. female  PRE-OPERATIVE DIAGNOSIS:  anemia  POST-OPERATIVE DIAGNOSIS: Gastritis, gastric diverticulum  PROCEDURE:  Procedure(s): ESOPHAGOGASTRODUODENOSCOPY (EGD) WITH PROPOFOL (N/A) BIOPSY  SURGEON:  Surgeon(s) and Role:    * Ronnel Zuercher, MD - Primary  Findings --------- -EGD showed mild gastritis and gastric diverticulum.  No active bleeding.  Mild inflammation in the duodenal bulb.  Recommendations ---------------------- -Plan for colonoscopy tomorrow -Continue to hold anticoagulation -Okay to have clear liquid diet today.  N.p.o. past midnight -Monitor H&H.  Transfuse if hemoglobin less than 8.  Otis Brace MD, [place] 01/31/2020, 2:30 PM  Contact #  [phone]

## 2020-01-31 NOTE — Progress Notes (Signed)
Assessed pt. Pt is doing well, advanced to clear liquid diet. Tolerating liquids well. Pt c/o jaw pain 9/10 post-EGD. Intervention applied heat pack to left jaw. Pt remains pleasant, nice conversation and ambulating to [facility] with minimal to no assist. Prepare for colonoscopy tomorrow.

## 2020-01-31 NOTE — Anesthesia Preprocedure Evaluation (Signed)
Anesthesia Evaluation  Patient identified by MRN, date of birth, ID band Patient awake    Reviewed: Allergy & Precautions, NPO status , Patient's Chart, lab work & pertinent test results  History of Anesthesia Complications Negative for: history of anesthetic complications  Airway Mallampati: II  TM Distance: >3 FB Neck ROM: Full    Dental  (+) Dental Advisory Given   Pulmonary shortness of breath, at rest and Long-Term Oxygen Therapy, neg sleep apnea, COPD,  COPD inhaler and oxygen dependent, neg recent URI, former smoker,  Covid-19 Nucleic Acid Test Results Lab Results      Component                Value               Date                      [id number]              NEGATIVE            01/30/2020                [place]              NEGATIVE            11/18/2019                [place] (A)        04/11/2019                [place]              NOT DETECTED        03/25/2019                [place]              NEGATIVE            10/13/2018               + decreased breath sounds      Cardiovascular hypertension, Pt. on medications + Peripheral Vascular Disease and +CHF   Rhythm:Regular     Neuro/Psych Seizures -,  negative psych ROS   GI/Hepatic GERD  ,Crohn's, ? GI bleed    Endo/Other  Hypothyroidism Prednisone 49m daily  Renal/GU ESRF and DialysisRenal diseaseLab Results      Component                Value               Date                      CREATININE               10.69 (H)           01/31/2020           Lab Results      Component                Value               Date                      K                        3.9  01/31/2020                Musculoskeletal  (+) Arthritis ,   Abdominal   Peds  Hematology  (+) Blood dyscrasia, anemia , Lab Results      Component                Value               Date                      WBC                       6.7                 01/31/2020                HGB                      8.2 (L)             01/31/2020                HGB                      8.2 (L)             01/31/2020                HCT                      28.2 (L)            01/31/2020                HCT                      27.3 (L)            01/31/2020                MCV                      100.4 (H)           01/31/2020                PLT                      118 (L)             01/31/2020              Anesthesia Other Findings   Reproductive/Obstetrics                             Anesthesia Physical Anesthesia Plan  ASA: IV  Anesthesia Plan: MAC   Post-op Pain Management:    Induction: Intravenous  PONV Risk Score and Plan: 2 and Propofol infusion and Treatment may vary due to age or medical condition  Airway Management Planned: Nasal Cannula  Additional Equipment: None  Intra-op Plan:   Post-operative Plan:   Informed Consent: I have reviewed the patients History and Physical, chart, labs and discussed the procedure including the risks, benefits and alternatives for the proposed anesthesia with the patient or authorized representative who has indicated his/her understanding and acceptance.     Dental advisory given  Plan Discussed  with: CRNA and Surgeon  Anesthesia Plan Comments: (1. The left ventricle has hyperdynamic systolic function, with an  ejection fraction of >65%. The cavity size was normal. Left ventricular  diastolic Doppler parameters are consistent with impaired relaxation.  2. The right ventricle has normal systolic function. The cavity was  normal. There is no increase in right ventricular wall thickness. Right  ventricular systolic pressure is mildly elevated with an estimated  pressure of 44.3 mmHg.  3. The aortic valve is tricuspid. Mild thickening of the aortic valve.  Moderate calcification of the aortic valve. )        Anesthesia Quick  Evaluation

## 2020-01-31 NOTE — Progress Notes (Signed)
New Admission Note:   Arrival Method: from ED via stretcher Mental Orientation: Alert & oriented x4 Telemetry: N/A Assessment: completed Skin: Intact, warm and dry IV: RFA, saline locked Pain: 0/10 Tubes: None Safety Measures: Safety Fall Prevention Plan has been discussed  Admission: completed 5 [place] Orientation: Patient has been orientated to the room, unit and staff.   Family: none at bedside  Orders to be reviewed and implemented. Will continue to monitor the patient. Call light has been placed within reach and bed alarm has been activated.

## 2020-01-31 NOTE — Op Note (Signed)
[facility] Patient Name: Natasha [name] Procedure Date : 01/31/2020 MRN: [id number] Attending MD: Otis Brace , MD Date of Birth: 1945/11/20 CSN: [id number] Age: 74 Admit Type: Inpatient Procedure:                Upper GI endoscopy Indications:              Melena Providers:                Otis Brace, MD, Benetta Spar RN, RN, Ladona Ridgel, Technician Referring MD:              Medicines:                Sedation Administered by an Anesthesia Professional Complications:            No immediate complications. Estimated Blood Loss:     Estimated blood loss was minimal. Procedure:                Pre-Anesthesia Assessment:                           - Prior to the procedure, a History and Physical                            was performed, and patient medications and                            allergies were reviewed. The patient's tolerance of                            previous anesthesia was also reviewed. The risks                            and benefits of the procedure and the sedation                            options and risks were discussed with the patient.                            All questions were answered, and informed consent                            was obtained. Prior Anticoagulants: The patient has                            taken no previous anticoagulant or antiplatelet                            agents. ASA Grade Assessment: III - A patient with                            severe systemic disease. After reviewing the risks  and benefits, the patient was deemed in                            satisfactory condition to undergo the procedure.                           After obtaining informed consent, the endoscope was                            passed under direct vision. Throughout the                            procedure, the patient's blood pressure, pulse, and                             oxygen saturations were monitored continuously. The                            GIF-H190 ([id number]) Olympus gastroscope was                            introduced through the mouth, and advanced to the                            second part of duodenum. The upper GI endoscopy was                            performed with moderate difficulty due to issues                            with the bite block. The patient tolerated the                            procedure well. Scope In: Scope Out: Findings:      The Z-line was regular and was found 38 cm from the incisors.      Diffuse mild inflammation characterized by congestion (edema) and       erythema was found in the entire examined stomach. Biopsies were taken       with a cold forceps for histology.      A medium non-bleeding diverticulum was found in the gastric fundus.      The cardia and gastric fundus were normal on retroflexion.      Scattered mild inflammation was found in the duodenal bulb.      The first portion of the duodenum and second portion of the duodenum       were normal. Impression:               - Z-line regular, 38 cm from the incisors.                           - Gastritis. Biopsied.                           - Gastric diverticulum.                           -  Duodenitis.                           - Normal first portion of the duodenum and second                            portion of the duodenum. Recommendation:           - Return patient to hospital ward for ongoing care.                           - Clear liquid diet.                           - Continue present medications.                           - Await pathology results.                           - Perform a colonoscopy tomorrow. Procedure Code(s):        --- Professional ---                           [phone], Esophagogastroduodenoscopy, flexible,                            transoral; with biopsy, single or multiple Diagnosis Code(s):        --- Professional  ---                           K29.70, Gastritis, unspecified, without bleeding                           K31.4, Gastric diverticulum                           K29.80, Duodenitis without bleeding                           K92.1, Melena (includes Hematochezia) CPT copyright 2019 American Medical Association. All rights reserved. The codes documented in this report are preliminary and upon coder review may  be revised to meet current compliance requirements. Otis Brace, MD Otis Brace, MD 01/31/2020 2:16:29 PM Number of Addenda: 0

## 2020-02-01 ENCOUNTER — Other Ambulatory Visit: Payer: Self-pay | Admitting: Physician Assistant

## 2020-02-01 LAB — RENAL FUNCTION PANEL
Albumin: 3 g/dL — ABNORMAL LOW (ref 3.5–5.0)
Anion gap: 17 — ABNORMAL HIGH (ref 5–15)
BUN: 67 mg/dL — ABNORMAL HIGH (ref 8–23)
CO2: 14 mmol/L — ABNORMAL LOW (ref 22–32)
Calcium: 8.5 mg/dL — ABNORMAL LOW (ref 8.9–10.3)
Chloride: 103 mmol/L (ref 98–111)
Creatinine, Ser: 12.34 mg/dL — ABNORMAL HIGH (ref 0.44–1.00)
GFR calc Af Amer: 3 mL/min — ABNORMAL LOW (ref >60)
GFR calc non Af Amer: 3 mL/min — ABNORMAL LOW (ref >60)
Glucose, Bld: 68 mg/dL — ABNORMAL LOW (ref 70–99)
Phosphorus: 11.1 mg/dL — ABNORMAL HIGH (ref 2.5–4.6)
Potassium: 4.7 mmol/L (ref 3.5–5.1)
Sodium: 134 mmol/L — ABNORMAL LOW (ref 135–145)

## 2020-02-01 LAB — CBC WITH DIFFERENTIAL/PLATELET
Abs Immature Granulocytes: 0.06 10*3/uL (ref 0.00–0.07)
Basophils Absolute: 0 10*3/uL (ref 0.0–0.1)
Basophils Relative: 0 %
Eosinophils Absolute: 0 10*3/uL (ref 0.0–0.5)
Eosinophils Relative: 0 %
HCT: 26.9 % — ABNORMAL LOW (ref 36.0–46.0)
Hemoglobin: 8.2 g/dL — ABNORMAL LOW (ref 12.0–15.0)
Immature Granulocytes: 1 %
Lymphocytes Relative: 14 %
Lymphs Abs: 0.9 10*3/uL (ref 0.7–4.0)
MCH: 29.5 pg (ref 26.0–34.0)
MCHC: 30.5 g/dL (ref 30.0–36.0)
MCV: 96.8 fL (ref 80.0–100.0)
Monocytes Absolute: 0.2 10*3/uL (ref 0.1–1.0)
Monocytes Relative: 3 %
Neutro Abs: 5.5 10*3/uL (ref 1.7–7.7)
Neutrophils Relative %: 82 %
Platelets: 131 10*3/uL — ABNORMAL LOW (ref 150–400)
RBC: 2.78 MIL/uL — ABNORMAL LOW (ref 3.87–5.11)
RDW: 18.4 % — ABNORMAL HIGH (ref 11.5–15.5)
WBC: 6.7 10*3/uL (ref 4.0–10.5)
nRBC: 0 % (ref 0.0–0.2)

## 2020-02-01 LAB — SURGICAL PATHOLOGY

## 2020-02-01 MED ORDER — POLYETHYLENE GLYCOL 3350 17 G PO PACK
17.0000 g | PACK | Freq: Two times a day (BID) | ORAL | Status: AC
  Administered 2020-02-01: 17 g via ORAL
  Filled 2020-02-01: qty 1

## 2020-02-01 MED ORDER — FLEET ENEMA 7-19 GM/118ML RE ENEM: 1.0000 | ENEMA | Freq: Once | RECTAL | Status: DC

## 2020-02-01 MED ORDER — HYDROCODONE-ACETAMINOPHEN 5-325 MG PO TABS
1.0000 | ORAL_TABLET | Freq: Four times a day (QID) | ORAL | Status: DC | PRN
  Administered 2020-02-01 – 2020-02-02 (×3): 1 via ORAL
  Filled 2020-02-01 (×3): qty 1

## 2020-02-01 MED ORDER — DOXERCALCIFEROL 4 MCG/2ML IV SOLN
INTRAVENOUS | Status: AC
  Administered 2020-02-01: 3 ug via INTRAVENOUS
  Filled 2020-02-01: qty 2

## 2020-02-01 MED ORDER — MINERAL OIL RE ENEM
1.0000 | ENEMA | Freq: Once | RECTAL | Status: DC
  Filled 2020-02-01 (×2): qty 1

## 2020-02-01 NOTE — Progress Notes (Signed)
[facility] Gastroenterology Progress Note  Natasha [name] 74 y.o. Oct 07, 1945  CC: Anemia,   Subjective: Patient seen and examined in dialysis unit.  Did not completed her prep yesterday.  Had some epigastric discomfort with the prep.  Denies seeing any blood in the stool or black stool.  ROS : Negative for chest pain.  Afebrile.   Objective: Vital signs in last 24 hours: Vitals:   02/01/20 0930 02/01/20 1000  BP: 116/68 116/66  Pulse: 90 88  Resp:    Temp:    SpO2:      Physical Exam:  General:  Alert, cooperative, no distress, appears stated age  Head:  Normocephalic, without obvious abnormality, atraumatic  Eyes:  , EOM's intact,   Lungs:    No respiratory distress noted  Heart:  Regular rate and rhythm, S1, S2 normal  Abdomen:   Soft, non-tender, nondistended, bowel sounds present   Lab Results: Recent Labs    01/31/20 0557 02/01/20 0500  NA 137 134*  K 3.9 4.7  CL 104 103  CO2 17* 14*  GLUCOSE 72 68*  BUN 56* 67*  CREATININE 10.69* 12.34*  CALCIUM 8.6* 8.5*  PHOS  --  11.1*   Recent Labs    02/01/20 0500  ALBUMIN 3.0*   Recent Labs    01/30/20 1741 01/30/20 1810 01/31/20 0557 02/01/20 0500  WBC 5.0   < > 6.7 6.7  NEUTROABS 3.6  --   --  5.5  HGB 6.8*   < > 8.2*  8.2* 8.2*  HCT 23.2*   < > 28.2*  27.3* 26.9*  MCV 104.0*   < > 100.4* 96.8  PLT 126*   < > 118* 131*   < > = values in this interval not displayed.   No results for input(s): LABPROT, INR in the last 72 hours.    Assessment/Plan: -Anemia with dark and heme positive stool.  EGD yesterday showed mild gastritis without any evidence of bleeding. -Acute blood loss anemia.  Status post blood transfusion. -History of pulmonary embolism on Eliquis.  Currently on hold. 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  Currently on Humira. -End-stage renal disease on dialysis -COPD on home oxygen.  Currently not in  exacerbation.   Recommendations ------------------------- -Reschedule colonoscopy for tomorrow -Add MiraLAX twice a day and fleets enema.  Patient was advised to drink as much colon prep as she can. -Okay to have clear liquid diet today.  N.p.o. past midnight. -Monitor H&H.  Transfuse if hemoglobin less than 8.  GI will follow   Otis Brace MD, [place] 02/01/2020, 10:09 AM  Contact #  [phone]

## 2020-02-01 NOTE — Progress Notes (Signed)
PT Cancellation Note  Patient Details Name: Natasha [name] MRN: [id number] DOB: 09/02/1945   Cancelled Treatment:    Reason Eval/Treat Not Completed: Patient at procedure or test/unavailable   Currently in HD;  Will follow up later today as time allows;  Otherwise, will follow up for PT tomorrow;   Thank you,  Roney Marion, PT  Acute Rehabilitation Services Pager [phone] Office [phone]     [name] 02/01/2020, 8:17 AM

## 2020-02-01 NOTE — Progress Notes (Signed)
OT Cancellation Note  Patient Details Name: Natasha [name] MRN: [id number] DOB: 1945/08/30   Cancelled Treatment:    Reason Eval/Treat Not Completed: Patient at procedure or test/ unavailable (Pt currently in HD, will return.)  [name] 02/01/2020, 7:57 AM  [name], OTR/L Acute Rehabilitation Services Pager: [phone] Office: [phone]

## 2020-02-01 NOTE — Progress Notes (Signed)
[facility] Progress Note   Subjective:   Seen on HD, could not tolerate colonoscopy prep last night due to severe abdominal pain, reports plan is to try again tonight. Jaw also very sore after EGD yesterday but improving with heat. Tolerating HD well, reports SOB is improved. No CP, palpitations, or dizziness.  Objective Vitals:   02/01/20 0514 02/01/20 0739 02/01/20 0757 02/01/20 0800  BP: (!) 124/57 (!) (P) 157/72 (!) 163/80 (!) 164/78  Pulse: 73 (P) 84 73 72  Resp: 18 (P) 18    Temp: 98.5 F (36.9 C) (P) 98.5 F (36.9 C)    TempSrc: Oral (P) Oral    SpO2: 100% (P) 100%    Weight:      Height:       Physical Exam General: Well developed female, alert and in NAD Heart: RRR, no murmurs, rubs or gallops Lungs: CTA bilaterally without wheezing,rhonchi or rales Abdomen: Soft, non-tender, non-distended, +BS Extremities: Trace pedal edema bilaterally Dialysis Access: RUE AVG accessed  Additional Objective Labs: Basic Metabolic Panel: Recent Labs  Lab 01/30/20 1741 01/30/20 1741 01/30/20 1810 01/30/20 1811 01/31/20 0557  NA 134*   < > 135 135 137  K 4.5   < > 4.5 4.4 3.9  CL 102  --   --  103 104  CO2 18*  --   --   --  17*  GLUCOSE 106*  --   --  96 72  BUN 52*  --   --  53* 56*  CREATININE 10.05*  --   --  11.10* 10.69*  CALCIUM 9.0  --   --   --  8.6*   < > = values in this interval not displayed.   CBC: Recent Labs  Lab 01/30/20 1741 01/30/20 1741 01/30/20 1810 01/30/20 1811 01/31/20 0557  WBC 5.0  --   --   --  6.7  NEUTROABS 3.6  --   --   --   --   HGB 6.8*   < > 7.5* 7.5* 8.2*  8.2*  HCT 23.2*   < > 22.0* 22.0* 28.2*  27.3*  MCV 104.0*  --   --   --  100.4*  PLT 126*  --   --   --  118*   < > = values in this interval not displayed.   Blood Culture    Component Value Date/Time   SDES BLOOD RIGHT ARM 04/11/2019 1340   SPECREQUEST  04/11/2019 1340    BOTTLES DRAWN AEROBIC AND ANAEROBIC Blood Culture results may not be optimal due  to an inadequate volume of blood received in culture bottles   CULT  04/11/2019 1340    NO GROWTH 5 DAYS Performed at [facility], [place] [address] [place], [place] [address]    REPTSTATUS 04/16/2019 FINAL 04/11/2019 1340    Iron Studies:  Recent Labs    01/30/20 2106  IRON 8*  TIBC 269  FERRITIN 1,192*    Studies/Results: DG Chest Port 1 View  Result Date: 01/30/2020 CLINICAL DATA:  Shortness of breath.  COPD.  On dialysis. EXAM: PORTABLE CHEST 1 VIEW COMPARISON:  11/18/2019 plain film and CT. FINDINGS: Hyperinflation. Left axillary vascular stent. Surgical clips in the lower neck. Mild cardiomegaly. Atherosclerosis in the transverse aorta. Left perihilar lung nodule has enlarged, 2.8 cm today versus 2.1 cm on 11/18/2019. No pleural effusion or pneumothorax. Numerous leads and wires project over the chest. No lobar consolidation. IMPRESSION: Hyperinflation, without acute superimposed disease. Aortic  Atherosclerosis (ICD10-I70.0). Enlarging left upper lobe lung nodule, as on prior exams (metastatic disease per CT of 11/18/2019). Electronically Signed   By: Abigail Miyamoto M.D.   On: 01/30/2020 17:30   Medications:  . albuterol  2.5 mg Inhalation BID  . calcium acetate  667 mg Oral TID WC  . Chlorhexidine Gluconate Cloth  6 each Topical Q0600  . doxercalciferol  3 mcg Intravenous Q M,W,F-HD  . fluticasone furoate-vilanterol  1 puff Inhalation Daily  . hydrocortisone sod succinate (SOLU-CORTEF) inj  50 mg Intravenous Q8H  . levothyroxine  125 mcg Oral Q0600  . midodrine  10 mg Oral TID WC  . pantoprazole (PROTONIX) IV  40 mg Intravenous Q12H  . umeclidinium bromide  1 puff Inhalation Daily    Dialysis Orders: Center: [facility]  on MWF. 180NRe, 4 hours, BFR 300, DFR 800, EDW 50kg, 3K/2.25 Ca, UF Prof 3, AVG 16g, heparin 4200 unit bolus No ESA due to likely malignancy Hectorol 3 mcg IV q HD Calcium acetate 2 caps TID with meals  Assessment/Plan: 1.   Acute on chronic anemia: Hgb 6.8 on admission and FOBT +. Hx of Crohns and has had GI bleeds in the past. Not on ESA due to likely malignancy. Received 1 unit PRBC, Hgb 8.2 yesterday, AM labs pending. Tsat 8%, ferritin 1192, already received dose of feraheme. EGD yesterday with gastritis but no active bleeding, plan for colonoscopy but could not tolerate prep last night.  2.  ESRD:  Dialyzes on MWF schedule. Trace edema today with should resolve with HD. Next dialysis 02/03/20.  3.  Hypertension/volume: BP controlled, slightly high at start of treatment. Takes midodrine TID, will continue here. UFG 2L today.  4.  Anemia: See #1 above. No ESA due to likely malignancy 5.  Metabolic bone disease: Calcium controlled. Continue hectorol. Resume calcium acetate binder once tolerating PO and follow phos. 6.  A fib: On eliquis, currently on hold. Per primary.  7. COPD: With possible mets on CT scan, followed by oncology and pulmonology. SOB improving today.  8. Crohn's dz: On Humira, followed by GI  Anice Paganini, PA-C 02/01/2020, 8:27 AM  [facility] Pager: [phone]

## 2020-02-01 NOTE — Progress Notes (Signed)
PROGRESS NOTE    Natasha [name]  YTK:[id number] DOB: 02/26/1946 DOA: 01/30/2020 PCP: [name], MD   Brief Narrative: 74 year old with past medical history significant for ESRD on hemodialysis on MWF, Crohn's disease on Humira and prednisone, lung lesion concerning for lung cancer, anemia, thrombocytopenia, SVT, history of PE on Eliquis present to the ER because of increasing weakness which is going on for the last week.  Patient also noticed black stool over the last week.  Patient is on chronically home oxygen 3 to 4 L due to COPD.  Chest x-ray shows lesion concerning for metastasis.  Labs were significant for low hemoglobin, 6.8.  Occult blood positive.  Patient was ordered 1 unit of packed red blood cells and admitted for acute GI bleed.  Covid test negative.  Patient underwent endoscopy on 9/7; diffuse mild inflammation characterized by congestion and erythema was found in the entire examined stomach.  A medium nonbleeding diverticulum was found in the gastric fundus.  Duodenitis. Plan was for colonoscopy on 9/8, but patient was not able to tolerate bowel prep for colonoscopy. Plan for colonoscopy on 9/9  Assessment & Plan:   Principal Problem:   Acute GI bleeding Active Problems:   Crohn's disease without complication (HCC)   Hypothyroidism   Emphysema/COPD (HCC)   Chronic diastolic CHF (congestive heart failure) (HCC)   Hx of Pulmonary embolism (HCC)   Metastatic lung carcinoma, left (HCC)   Acute blood loss anemia   1-Acute on chronic blood loss anemia, iron deficiency anemia Concern for GI bleed due to melena on admission and anemia Underwent endoscopy on 9/7: Showed gastritis, gastric diverticulum and duodenitis Plan for colonoscopy on 9/9 Status post 1 unit of packed red blood cells on 9/6 and Feraheme on 9/7 Hb stable at 8.   2-ESRD on Monday Wednesday and Friday dialysis: Patient seen during hemodialysis today.  3-History of Crohn's disease: Holding  home Humira and POS steroid, on IVF steroid  4-Paroxysmal A. fib, history of SVT Holding Eliquis and Cardizem for now.  History of PE and DVT: Occurred  years ago Hypothyroidism: Continue with Synthroid History of possible metastatic lung cancer: Continue follow-up with oncology and pulmonology   Nutrition Problem: Moderate Malnutrition Etiology: chronic illness (ESRD on HD)    Signs/Symptoms: mild muscle depletion, mild fat depletion    Interventions: MVI, Snacks  Estimated body mass index is 18.92 kg/m as calculated from the following:   Height as of this encounter: 5' 4"  (1.626 m).   Weight as of this encounter: 50 kg.   DVT prophylaxis: SCD Code Status: Full code Family Communication: care discussed with patient.  Disposition Plan:  Status is: Inpatient  Remains inpatient appropriate because:Altered mental status and Ongoing diagnostic testing needed not appropriate for outpatient work up   Dispo: The patient is from: Home              Anticipated d/c is to: Home              Anticipated d/c date is: 2 days              Patient currently is not medically stable to d/c.  Plan for colonoscopy 9/9        Consultants:   GI  Nephrology  Procedures:   Endoscopy 9/7  Antimicrobials:  None  Subjective: Patient seen in hemodialysis.  She reports her left jaw swelling and pain significantly improved.  She is now able to open her mouth.  Objective: Vitals:   02/01/20  0930 02/01/20 1000 02/01/20 1030 02/01/20 1112  BP: 116/68 116/66 103/68 134/65  Pulse: 90 88 91 90  Resp:    18  Temp:    98.5 F (36.9 C)  TempSrc:    Oral  SpO2:      Weight:    50 kg  Height:        Intake/Output Summary (Last 24 hours) at 02/01/2020 1203 Last data filed at 02/01/2020 1112 Gross per 24 hour  Intake 1120 ml  Output 1310 ml  Net -190 ml   Filed Weights   01/31/20 2042 02/01/20 0739 02/01/20 1112  Weight: 116 kg 51.5 kg 50 kg    Examination:  General exam:  Appears calm and comfortable  Respiratory system: Clear to auscultation. Respiratory effort normal. Cardiovascular system: S1 & S2 heard, RRR. No JVD, murmurs, rubs, gallops or clicks. No pedal edema. Gastrointestinal system: Abdomen is nondistended, soft and nontender. No organomegaly or masses felt. Normal bowel sounds heard. Central nervous system: Alert and oriented.  Extremities: Symmetric 5 x 5 power.   Data Reviewed: I have personally reviewed following labs and imaging studies  CBC: Recent Labs  Lab 01/30/20 1741 01/30/20 1810 01/30/20 1811 01/31/20 0557 02/01/20 0500  WBC 5.0  --   --  6.7 6.7  NEUTROABS 3.6  --   --   --  5.5  HGB 6.8* 7.5* 7.5* 8.2*  8.2* 8.2*  HCT 23.2* 22.0* 22.0* 28.2*  27.3* 26.9*  MCV 104.0*  --   --  100.4* 96.8  PLT 126*  --   --  118* 425*   Basic Metabolic Panel: Recent Labs  Lab 01/30/20 1741 01/30/20 1810 01/30/20 1811 01/31/20 0557 02/01/20 0500  NA 134* 135 135 137 134*  K 4.5 4.5 4.4 3.9 4.7  CL 102  --  103 104 103  CO2 18*  --   --  17* 14*  GLUCOSE 106*  --  96 72 68*  BUN 52*  --  53* 56* 67*  CREATININE 10.05*  --  11.10* 10.69* 12.34*  CALCIUM 9.0  --   --  8.6* 8.5*  PHOS  --   --   --   --  11.1*   GFR: Estimated Creatinine Clearance: 3.2 mL/min (A) (by C-G formula based on SCr of 12.34 mg/dL (H)). Liver Function Tests: Recent Labs  Lab 02/01/20 0500  ALBUMIN 3.0*   No results for input(s): LIPASE, AMYLASE in the last 168 hours. No results for input(s): AMMONIA in the last 168 hours. Coagulation Profile: No results for input(s): INR, PROTIME in the last 168 hours. Cardiac Enzymes: No results for input(s): CKTOTAL, CKMB, CKMBINDEX, TROPONINI in the last 168 hours. BNP (last 3 results) No results for input(s): PROBNP in the last 8760 hours. HbA1C: No results for input(s): HGBA1C in the last 72 hours. CBG: No results for input(s): GLUCAP in the last 168 hours. Lipid Profile: No results for input(s): CHOL,  HDL, LDLCALC, TRIG, CHOLHDL, LDLDIRECT in the last 72 hours. Thyroid Function Tests: No results for input(s): TSH, T4TOTAL, FREET4, T3FREE, THYROIDAB in the last 72 hours. Anemia Panel: Recent Labs    01/30/20 2106  VITAMINB12 1,062*  FOLATE 7.8  FERRITIN 1,192*  TIBC 269  IRON 8*  RETICCTPCT 1.9   Sepsis Labs: No results for input(s): PROCALCITON, LATICACIDVEN in the last 168 hours.  Recent Results (from the past 240 hour(s))  SARS Coronavirus 2 by RT PCR (hospital order, performed in [facility] hospital lab) Nasopharyngeal Nasopharyngeal Swab  Status: None   Collection Time: 01/30/20  5:37 PM   Specimen: Nasopharyngeal Swab  Result Value Ref Range Status   SARS Coronavirus 2 NEGATIVE NEGATIVE Final    Comment: (NOTE) SARS-CoV-2 target nucleic acids are NOT DETECTED.  The SARS-CoV-2 RNA is generally detectable in upper and lower respiratory specimens during the acute phase of infection. The lowest concentration of SARS-CoV-2 viral copies this assay can detect is 250 copies / mL. A negative result does not preclude SARS-CoV-2 infection and should not be used as the sole basis for treatment or other patient management decisions.  A negative result may occur with improper specimen collection / handling, submission of specimen other than nasopharyngeal swab, presence of viral mutation(s) within the areas targeted by this assay, and inadequate number of viral copies (<250 copies / mL). A negative result must be combined with clinical observations, patient history, and epidemiological information.  Fact Sheet for Patients:   StrictlyIdeas.no  Fact Sheet for Healthcare Providers: BankingDealers.co.za  This test is not yet approved or  cleared by the [place] FDA and has been authorized for detection and/or diagnosis of SARS-CoV-2 by FDA under an Emergency Use Authorization (EUA).  This EUA will remain in effect (meaning  this test can be used) for the duration of the COVID-19 declaration under Section 564(b)(1) of the Act, 21 U.S.C. section 360bbb-3(b)(1), unless the authorization is terminated or revoked sooner.  Performed at [facility], [place] [address] [place], [place] [address]   MRSA PCR Screening     Status: None   Collection Time: 01/31/20  1:46 AM   Specimen: Nasopharyngeal  Result Value Ref Range Status   MRSA by PCR NEGATIVE NEGATIVE Final    Comment:        The GeneXpert MRSA Assay (FDA approved for NASAL specimens only), is one component of a comprehensive MRSA colonization surveillance program. It is not intended to diagnose MRSA infection nor to guide or monitor treatment for MRSA infections. Performed at [facility], [place] [address], [place], [place] [address]          Radiology Studies: DG Chest Port 1 View  Result Date: 01/30/2020 CLINICAL DATA:  Shortness of breath.  COPD.  On dialysis. EXAM: PORTABLE CHEST 1 VIEW COMPARISON:  11/18/2019 plain film and CT. FINDINGS: Hyperinflation. Left axillary vascular stent. Surgical clips in the lower neck. Mild cardiomegaly. Atherosclerosis in the transverse aorta. Left perihilar lung nodule has enlarged, 2.8 cm today versus 2.1 cm on 11/18/2019. No pleural effusion or pneumothorax. Numerous leads and wires project over the chest. No lobar consolidation. IMPRESSION: Hyperinflation, without acute superimposed disease. Aortic Atherosclerosis (ICD10-I70.0). Enlarging left upper lobe lung nodule, as on prior exams (metastatic disease per CT of 11/18/2019). Electronically Signed   By: Abigail Miyamoto M.D.   On: 01/30/2020 17:30        Scheduled Meds: . albuterol  2.5 mg Inhalation BID  . calcium acetate  667 mg Oral TID WC  . Chlorhexidine Gluconate Cloth  6 each Topical Q0600  . doxercalciferol  3 mcg Intravenous Q M,W,F-HD  . fluticasone furoate-vilanterol  1 puff Inhalation Daily  . hydrocortisone sod succinate  (SOLU-CORTEF) inj  50 mg Intravenous Q8H  . levothyroxine  125 mcg Oral Q0600  . midodrine  10 mg Oral TID WC  . pantoprazole (PROTONIX) IV  40 mg Intravenous Q12H  . polyethylene glycol  17 g Oral BID  . sodium phosphate  1 enema Rectal Once  . umeclidinium bromide  1 puff Inhalation Daily   Continuous Infusions:   LOS: 2 days    Time spent: 35 minutes.     Elmarie Shiley, MD [facility]   If 7PM-7AM, please contact night-coverage [email or web address]  02/01/2020, 12:03 PM

## 2020-02-01 NOTE — Plan of Care (Signed)
  Problem: Education: Goal: Knowledge of General Education information will improve Description Including pain rating scale, medication(s)/side effects and non-pharmacologic comfort measures Outcome: Progressing   

## 2020-02-01 NOTE — Evaluation (Signed)
Occupational Therapy Evaluation Patient Details Name: Natasha [name] MRN: [id number] DOB: Dec 18, 1945 Today's Date: 02/01/2020    History of Present Illness Pt is a 74 year old woman admitted with acute GIB with low hgb and weakness. PMH: COPD on 4L 02 at baseline, hx of PE, ESRD, Crohns disease, lung lesion concerning for cancer, anemia, thrombocytopenia, SVT.    Clinical Impression   Pt walks and completes ADL independently at her baseline. She and her husband work together on IADL, especially on her HD days when she is more fatigued. Pt presents with generalized weakness and decreased activity tolerance. She is overall functioning at a supervision level. Pt is requesting at rollator to use to help her manage her 02 and because she is weak following HD. Will follow acutely.     Follow Up Recommendations  No OT follow up    Equipment Recommendations  Tub/shower seat (husband to purchase in the community), Rollator    Recommendations for Other Services       Precautions / Restrictions Precautions Precautions: None      Mobility Bed Mobility Overal bed mobility: Modified Independent             General bed mobility comments: HOB up  Transfers Overall transfer level: Needs assistance Equipment used: None Transfers: Sit to/from [employer] Transfers Sit to Stand: Supervision Stand pivot transfers: Supervision       General transfer comment: for safety, no physical assist    Balance                                           ADL either performed or assessed with clinical judgement   ADL Overall ADL's : Needs assistance/impaired Eating/Feeding: Independent   Grooming: Set up;Sitting   Upper Body Bathing: Set up;Sitting   Lower Body Bathing: Supervison/ safety;Sit to/from stand   Upper Body Dressing : Set up;Sitting   Lower Body Dressing: Supervision/safety;Sit to/from stand   Toilet Transfer: Supervision/safety;BSC   Toileting-  Water quality scientist and Hygiene: Supervision/safety;Sit to/from stand         General ADL Comments: Husband has been considering getting a shower seat for pt. Educated in features to consider when purchasing. Recommended pt use 02 on tank when showering.      Vision Patient Visual Report: No change from baseline       Perception     Praxis      Pertinent Vitals/Pain Pain Assessment: No/denies pain     Hand Dominance Right   Extremity/Trunk Assessment Upper Extremity Assessment Upper Extremity Assessment: Overall WFL for tasks assessed   Lower Extremity Assessment Lower Extremity Assessment: Defer to PT evaluation       Communication Communication Communication: No difficulties   Cognition Arousal/Alertness: Awake/alert Behavior During Therapy: WFL for tasks assessed/performed Overall Cognitive Status: Within Functional Limits for tasks assessed                                     General Comments       Exercises     Shoulder Instructions      Home Living Family/patient expects to be discharged to:: Private residence Living Arrangements: Spouse/significant other Available Help at Discharge: Family;Available 24 hours/day Type of Home: House Home Access: Level entry     Home Layout: One level  Bathroom Shower/Tub: Tub/shower unit;Door   [employer] Toilet: Standard     Home Equipment: Environmental consultant - 2 wheels;Cane - single point          Prior Functioning/Environment Level of Independence: Independent        Comments: Pt on 3-4L home O2 at baseline, stands to shower (does not wear her 02 in shower), works with husband to complete IADL.        OT Problem List: Decreased activity tolerance;Decreased knowledge of use of DME or AE      OT Treatment/Interventions: Self-care/ADL training;DME and/or AE instruction;Patient/family education;Energy conservation    OT Goals(Current goals can be found in the care plan section) Acute Rehab  OT Goals Patient Stated Goal: find out why she is bleeding OT Goal Formulation: With patient Time For Goal Achievement: 02/15/20 Potential to Achieve Goals: Good ADL Goals Additional ADL Goal #1: Pt will complete basic ADL modified independently. Additional ADL Goal #2: Pt will generalize energy conservation and breathing strategies in ADL.  OT Frequency: Min 2X/week   Barriers to D/C:            Co-evaluation              AM-PAC OT "6 Clicks" Daily Activity     Outcome Measure Help from another person eating meals?: None Help from another person taking care of personal grooming?: A Little Help from another person toileting, which includes using toliet, bedpan, or urinal?: A Little Help from another person bathing (including washing, rinsing, drying)?: A Little Help from another person to put on and taking off regular upper body clothing?: None Help from another person to put on and taking off regular lower body clothing?: A Little 6 Click Score: 20   End of Session Equipment Utilized During Treatment: Oxygen (4L)  Activity Tolerance: Patient tolerated treatment well Patient left: in bed;with family/visitor present;with call bell/phone within reach  OT Visit Diagnosis: Muscle weakness (generalized) (M62.81);Other (comment) (decreased activity tolerance)                Time: 1350-1415 OT Time Calculation (min): 25 min Charges:  OT General Charges $OT Visit: 1 Visit OT Evaluation $OT Eval Low Complexity: 1 Low OT Treatments $Self Care/Home Management : 8-22 mins  [name], OTR/L Acute Rehabilitation Services Pager: [phone] Office: [phone]  [name] 02/01/2020, 2:53 PM

## 2020-02-01 NOTE — Progress Notes (Signed)
Patient is for colonoscopy in AM. started bowel prep with go lytely at 1800 per patient request. Pt. was having jaw pain from earlier procedure, relieve with tylenol 650 mg PO. Check on patient at 23:30 patient unable to tolerate the prep. Only drank 2-3 cups of go lytely. [facility] Night call provider  Dr. Paulita Fujita made aware. Received a call back, to cancel procedure.

## 2020-02-02 ENCOUNTER — Encounter (HOSPITAL_COMMUNITY)
Admission: EM | Disposition: A | Discharge status: Home / Self Care | Payer: Self-pay | Source: Home / Self Care | Attending: Internal Medicine

## 2020-02-02 ENCOUNTER — Other Ambulatory Visit: Payer: Self-pay | Admitting: Medical Oncology

## 2020-02-02 ENCOUNTER — Inpatient Hospital Stay (HOSPITAL_COMMUNITY): Payer: Medicare Other | Admitting: Certified Registered"

## 2020-02-02 ENCOUNTER — Encounter (HOSPITAL_COMMUNITY): Payer: Self-pay | Admitting: Internal Medicine

## 2020-02-02 HISTORY — PX: POLYPECTOMY: SHX5525

## 2020-02-02 HISTORY — PX: COLONOSCOPY WITH PROPOFOL: SHX5780

## 2020-02-02 HISTORY — PX: BIOPSY: SHX5522

## 2020-02-02 LAB — RENAL FUNCTION PANEL
Albumin: 2.7 g/dL — ABNORMAL LOW (ref 3.5–5.0)
Anion gap: 13 (ref 5–15)
BUN: 30 mg/dL — ABNORMAL HIGH (ref 8–23)
CO2: 23 mmol/L (ref 22–32)
Calcium: 8.2 mg/dL — ABNORMAL LOW (ref 8.9–10.3)
Chloride: 100 mmol/L (ref 98–111)
Creatinine, Ser: 7.25 mg/dL — ABNORMAL HIGH (ref 0.44–1.00)
GFR calc Af Amer: 6 mL/min — ABNORMAL LOW (ref >60)
GFR calc non Af Amer: 5 mL/min — ABNORMAL LOW (ref >60)
Glucose, Bld: 86 mg/dL (ref 70–99)
Phosphorus: 7 mg/dL — ABNORMAL HIGH (ref 2.5–4.6)
Potassium: 3.8 mmol/L (ref 3.5–5.1)
Sodium: 136 mmol/L (ref 135–145)

## 2020-02-02 LAB — CBC WITH DIFFERENTIAL/PLATELET
Abs Immature Granulocytes: 0.03 10*3/uL (ref 0.00–0.07)
Basophils Absolute: 0 10*3/uL (ref 0.0–0.1)
Basophils Relative: 0 %
Eosinophils Absolute: 0 10*3/uL (ref 0.0–0.5)
Eosinophils Relative: 0 %
HCT: 23.8 % — ABNORMAL LOW (ref 36.0–46.0)
Hemoglobin: 7.4 g/dL — ABNORMAL LOW (ref 12.0–15.0)
Immature Granulocytes: 1 %
Lymphocytes Relative: 20 %
Lymphs Abs: 1 10*3/uL (ref 0.7–4.0)
MCH: 29.4 pg (ref 26.0–34.0)
MCHC: 31.1 g/dL (ref 30.0–36.0)
MCV: 94.4 fL (ref 80.0–100.0)
Monocytes Absolute: 0.5 10*3/uL (ref 0.1–1.0)
Monocytes Relative: 11 %
Neutro Abs: 3.4 10*3/uL (ref 1.7–7.7)
Neutrophils Relative %: 68 %
Platelets: 119 10*3/uL — ABNORMAL LOW (ref 150–400)
RBC: 2.52 MIL/uL — ABNORMAL LOW (ref 3.87–5.11)
RDW: 18 % — ABNORMAL HIGH (ref 11.5–15.5)
WBC: 5 10*3/uL (ref 4.0–10.5)
nRBC: 0 % (ref 0.0–0.2)

## 2020-02-02 SURGERY — COLONOSCOPY WITH PROPOFOL
Anesthesia: Monitor Anesthesia Care

## 2020-02-02 MED ORDER — CLARITHROMYCIN 250 MG PO TABS
250.0000 mg | ORAL_TABLET | Freq: Two times a day (BID) | ORAL | Status: DC
  Administered 2020-02-02 – 2020-02-03 (×3): 250 mg via ORAL
  Filled 2020-02-02 (×4): qty 1

## 2020-02-02 MED ORDER — SODIUM CHLORIDE 0.9 % IV SOLN
INTRAVENOUS | Status: AC | PRN
  Administered 2020-02-02: 500 mL via INTRAVENOUS

## 2020-02-02 MED ORDER — PHENYLEPHRINE 40 MCG/ML (10ML) SYRINGE FOR IV PUSH (FOR BLOOD PRESSURE SUPPORT)
PREFILLED_SYRINGE | INTRAVENOUS | Status: DC | PRN
  Administered 2020-02-02: 80 ug via INTRAVENOUS
  Administered 2020-02-02: 40 ug via INTRAVENOUS

## 2020-02-02 MED ORDER — CHLORHEXIDINE GLUCONATE CLOTH 2 % EX PADS: 6.0000 | MEDICATED_PAD | Freq: Every day | CUTANEOUS | Status: DC

## 2020-02-02 MED ORDER — AMOXICILLIN 500 MG PO CAPS
500.0000 mg | ORAL_CAPSULE | Freq: Two times a day (BID) | ORAL | Status: DC
  Administered 2020-02-02 – 2020-02-03 (×3): 500 mg via ORAL
  Filled 2020-02-02 (×4): qty 1

## 2020-02-02 MED ORDER — LIDOCAINE 2% (20 MG/ML) 5 ML SYRINGE
INTRAMUSCULAR | Status: DC | PRN
  Administered 2020-02-02: 100 mg via INTRAVENOUS

## 2020-02-02 MED ORDER — ALBUMIN HUMAN 5 % IV SOLN: INTRAVENOUS | Status: DC | PRN

## 2020-02-02 MED ORDER — HYDROMORPHONE HCL 1 MG/ML IJ SOLN
0.5000 mg | Freq: Once | INTRAMUSCULAR | Status: AC
  Administered 2020-02-02: 0.5 mg via INTRAVENOUS
  Filled 2020-02-02: qty 1

## 2020-02-02 MED ORDER — PROPOFOL 10 MG/ML IV BOLUS
INTRAVENOUS | Status: DC | PRN
  Administered 2020-02-02: 20 mg via INTRAVENOUS
  Administered 2020-02-02: 15 mg via INTRAVENOUS
  Administered 2020-02-02: 20 mg via INTRAVENOUS
  Administered 2020-02-02: 10 mg via INTRAVENOUS
  Administered 2020-02-02: 20 mg via INTRAVENOUS
  Administered 2020-02-02: 15 mg via INTRAVENOUS

## 2020-02-02 MED ORDER — MORPHINE SULFATE (PF) 2 MG/ML IV SOLN
2.0000 mg | Freq: Once | INTRAVENOUS | Status: DC
Start: 2020-02-02 — End: 2020-02-02

## 2020-02-02 MED ORDER — PROPOFOL 500 MG/50ML IV EMUL
INTRAVENOUS | Status: DC | PRN
  Administered 2020-02-02: 75 ug/kg/min via INTRAVENOUS

## 2020-02-02 SURGICAL SUPPLY — 22 items

## 2020-02-02 NOTE — Progress Notes (Signed)
[facility] Gastroenterology Progress Note  Natasha [name] 74 y.o. 08-03-1945  CC: Anemia,   Subjective: Patient seen and examined in endoscopy unit.  No acute issues overnight.  Currently having brown-colored stool.  Denies abdominal pain, nausea and vomiting.  ROS : Negative for chest pain.  Afebrile.   Objective: Vital signs in last 24 hours: Vitals:   02/02/20 0455 02/02/20 0732  BP: 138/62 (!) 168/73  Pulse: 69 78  Resp: 18 (!) 22  Temp: 98.5 F (36.9 C) 98.2 F (36.8 C)  SpO2: 100% 100%    Physical Exam:  General:  Alert, cooperative, no distress, appears stated age  Head:  Normocephalic, without obvious abnormality, atraumatic  Eyes:  , EOM's intact,   Lungs:    No respiratory distress noted  Heart:  Regular rate and rhythm, S1, S2 normal  Abdomen:   Soft, non-tender, nondistended, bowel sounds present   Lab Results: Recent Labs    02/01/20 0500 02/02/20 0415  NA 134* 136  K 4.7 3.8  CL 103 100  CO2 14* 23  GLUCOSE 68* 86  BUN 67* 30*  CREATININE 12.34* 7.25*  CALCIUM 8.5* 8.2*  PHOS 11.1* 7.0*   Recent Labs    02/01/20 0500 02/02/20 0415  ALBUMIN 3.0* 2.7*   Recent Labs    02/01/20 0500 02/02/20 0415  WBC 6.7 5.0  NEUTROABS 5.5 3.4  HGB 8.2* 7.4*  HCT 26.9* 23.8*  MCV 96.8 94.4  PLT 131* 119*   No results for input(s): LABPROT, INR in the last 72 hours.    Assessment/Plan: -Anemia with dark and heme positive stool.  EGD 9/7  showed mild gastritis without any evidence of bleeding.  Biopsies positive for H. Pylori. -H. pylori gastritis. -Acute blood loss anemia.  Status post blood transfusion.  Hemoglobin stable. -History of pulmonary embolism on Eliquis.  Currently on hold. 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  Currently on Humira. -End-stage renal disease on dialysis -COPD on home oxygen.  Currently not in exacerbation.   Recommendations ------------------------- -Proceed with  colonoscopy today. -Start amoxicillin and clarithromycin for H. pylori.  Renally adjusted dose.  Risks (bleeding, infection, bowel perforation that could require surgery, sedation-related changes in cardiopulmonary systems), benefits (identification and possible treatment of source of symptoms, exclusion of certain causes of symptoms), and alternatives (watchful waiting, radiographic imaging studies, empiric medical treatment)  were explained to patient/family in detail and patient wishes to proceed.   Otis Brace MD, [place] 02/02/2020, 8:03 AM  Contact #  [phone]

## 2020-02-02 NOTE — Anesthesia Preprocedure Evaluation (Addendum)
Anesthesia Evaluation  Patient identified by MRN, date of birth, ID band Patient awake    Reviewed: Allergy & Precautions, NPO status , Patient's Chart, lab work & pertinent test results  History of Anesthesia Complications Negative for: history of anesthetic complications  Airway Mallampati: II  TM Distance: >3 FB Neck ROM: Full    Dental  (+) Dental Advisory Given   Pulmonary shortness of breath, COPD, former smoker,  Covid-19 Nucleic Acid Test Results Lab Results      Component                Value               Date                      SARSCOV2NAA              NEGATIVE            01/30/2020                [place]              NEGATIVE            11/18/2019                [place] (A)        04/11/2019                [place]              NOT DETECTED        03/25/2019                [place]              NEGATIVE            10/13/2018               + decreased breath sounds      Cardiovascular hypertension, + Peripheral Vascular Disease and +CHF   Rhythm:Regular  1. The left ventricle has hyperdynamic systolic function, with an  ejection fraction of >65%. The cavity size was normal. Left ventricular  diastolic Doppler parameters are consistent with impaired relaxation.  2. The right ventricle has normal systolic function. The cavity was  normal. There is no increase in right ventricular wall thickness. Right  ventricular systolic pressure is mildly elevated with an estimated  pressure of 44.3 mmHg.  3. The aortic valve is tricuspid. Mild thickening of the aortic valve.  Moderate calcification of the aortic valve.    Neuro/Psych Seizures -,     GI/Hepatic Neg liver ROS, GERD  ,  Endo/Other  Hypothyroidism   Renal/GU ESRF and DialysisRenal diseaseLab Results      Component                Value               Date                      CREATININE               7.25 (H)             02/02/2020           Lab Results      Component  Value               Date                      K                        3.8                 02/02/2020                Musculoskeletal   Abdominal   Peds  Hematology  (+) Blood dyscrasia, anemia , Lab Results      Component                Value               Date                      WBC                      5.0                 02/02/2020                HGB                      7.4 (L)             02/02/2020                HCT                      23.8 (L)            02/02/2020                MCV                      94.4                02/02/2020                PLT                      119 (L)             02/02/2020              Anesthesia Other Findings   Reproductive/Obstetrics                            Anesthesia Physical Anesthesia Plan  ASA: IV  Anesthesia Plan: MAC   Post-op Pain Management:    Induction: Intravenous  PONV Risk Score and Plan: 2 and Treatment may vary due to age or medical condition and Propofol infusion  Airway Management Planned: Nasal Cannula  Additional Equipment: None  Intra-op Plan:   Post-operative Plan:   Informed Consent: I have reviewed the patients History and Physical, chart, labs and discussed the procedure including the risks, benefits and alternatives for the proposed anesthesia with the patient or authorized representative who has indicated his/her understanding and acceptance.     Dental advisory given  Plan Discussed with: CRNA  Anesthesia Plan Comments:         Anesthesia Quick Evaluation

## 2020-02-02 NOTE — Anesthesia Procedure Notes (Signed)
Procedure Name: MAC Date/Time: 02/02/2020 8:24 AM Performed by: Janace Litten, CRNA Pre-anesthesia Checklist: Patient identified, Emergency Drugs available, Suction available and Patient being monitored Patient Re-evaluated:Patient Re-evaluated prior to induction Oxygen Delivery Method: Simple face mask

## 2020-02-02 NOTE — Anesthesia Postprocedure Evaluation (Signed)
Anesthesia Post Note  Patient: Natasha [name]  Procedure(s) Performed: COLONOSCOPY WITH PROPOFOL (N/A ) BIOPSY POLYPECTOMY     Patient location during evaluation: Endoscopy Anesthesia Type: MAC Level of consciousness: awake and alert Pain management: pain level controlled Vital Signs Assessment: post-procedure vital signs reviewed and stable Respiratory status: spontaneous breathing, nonlabored ventilation, respiratory function stable and patient connected to nasal cannula oxygen Cardiovascular status: stable and blood pressure returned to baseline Postop Assessment: no apparent nausea or vomiting Anesthetic complications: no   No complications documented.  Last Vitals:  Vitals:   02/02/20 0930 02/02/20 0935  BP:  (!) 133/54  Pulse: 69 80  Resp: 17 13  Temp:    SpO2: 100% 100%    Last Pain:  Vitals:   02/02/20 0935  TempSrc:   PainSc: 0-No pain                 Nasir Bright

## 2020-02-02 NOTE — Transfer of Care (Signed)
Immediate Anesthesia Transfer of Care Note  Patient: Natasha [name]  Procedure(s) Performed: COLONOSCOPY WITH PROPOFOL (N/A ) BIOPSY POLYPECTOMY  Patient Location: Endoscopy Unit  Anesthesia Type:MAC  Level of Consciousness: awake, alert  and patient cooperative  Airway & Oxygen Therapy: Patient Spontanous Breathing and Patient connected to face mask oxygen  Post-op Assessment: Report given to RN and Post -op Vital signs reviewed and stable  Post vital signs: Reviewed and stable  Last Vitals:  Vitals Value Taken Time  BP 97/40 02/02/20 0912  Temp 36.5 C 02/02/20 0913  Pulse 75 02/02/20 0914  Resp 15 02/02/20 0914  SpO2 100 % 02/02/20 0914  Vitals shown include unvalidated device data.  Last Pain:  Vitals:   02/02/20 0913  TempSrc: Oral  PainSc:       Patients Stated Pain Goal: 0 (43/32/95 1884)  Complications: No complications documented.

## 2020-02-02 NOTE — Op Note (Signed)
[facility] Patient Name: Natasha [name] Procedure Date : 02/02/2020 MRN: [id number] Attending MD: Otis Brace , MD Date of Birth: 1946-04-05 CSN: [id number] Age: 74 Admit Type: Inpatient Procedure:                Colonoscopy Indications:              Gastrointestinal bleeding, Follow-up of Crohn's                            disease Providers:                Otis Brace, MD, Jeanella Cara, RN,                            Elspeth Cho Tech., Technician, Mora Appl, CRNA Referring MD:              Medicines:                Sedation Administered by an Anesthesia Professional Complications:            No immediate complications. Estimated Blood Loss:     Estimated blood loss was minimal. Procedure:                Pre-Anesthesia Assessment:                           - Prior to the procedure, a History and Physical                            was performed, and patient medications and                            allergies were reviewed. The patient's tolerance of                            previous anesthesia was also reviewed. The risks                            and benefits of the procedure and the sedation                            options and risks were discussed with the patient.                            All questions were answered, and informed consent                            was obtained. Prior Anticoagulants: The patient has                            taken Eliquis (apixaban), last dose was 3 days                            prior to procedure. ASA Grade Assessment: IV - A  patient with severe systemic disease that is a                            constant threat to life. After reviewing the risks                            and benefits, the patient was deemed in                            satisfactory condition to undergo the procedure.                           After obtaining informed consent, the colonoscope                             was passed under direct vision. Throughout the                            procedure, the patient's blood pressure, pulse, and                            oxygen saturations were monitored continuously. The                            PCF-H190DL ([id number]) Olympus pediatric colonoscope                            was introduced through the anus and advanced to the                            the ileocolonic anastomosis. The colonoscopy was                            performed with moderate difficulty due to                            significant looping. Successful completion of the                            procedure was aided by applying abdominal pressure.                            The patient tolerated the procedure well. The                            quality of the bowel preparation was adequate to                            identify polyps 6 mm and larger in size. Scope In: 8:35:18 AM Scope Out: 9:06:23 AM Scope Withdrawal Time: 0 hours 22 minutes 1 second  Total Procedure Duration: 0 hours 31 minutes 5 seconds  Findings:      The perianal exam findings include perianal fistula.      Skin tags were found on  perianal exam.      There was evidence of a prior end-to-side ileo-colonic anastomosis in       the transverse colon. This was non-patent with stenosis and was       characterized by edema, erosion and ulceration. The anastomosis could       not be traversed. Biopsies were taken with a cold forceps for histology.      Normal mucosa was found in the rectum, in the descending colon and in       the transverse colon. Biopsies were taken with a cold forceps for       histology.      A localized area of mildly ulcerated mucosa was found in the proximal       sigmoid colon. Biopsies were taken with a cold forceps for histology.      A 10 mm polyp was found in the sigmoid colon. The polyp was sessile. The       polyp was removed with a hot snare. Resection and  retrieval were       complete.      Multiple diverticula were found in the sigmoid colon.      Internal hemorrhoids were found during retroflexion. The hemorrhoids       were medium-sized. 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                           - Perianal skin tags found on perianal exam.                           - Non-patent end-to-side ileo-colonic anastomosis,                            characterized by edema, erosion and ulceration.                            Biopsied.                           - Normal mucosa in the rectum, in the descending                            colon and in the transverse colon. Biopsied.                           - Ulcerated mucosa in the proximal sigmoid colon.                            Biopsied.                           - One 10 mm polyp in the sigmoid colon, removed                            with a hot snare. Resected and retrieved.                           - Diverticulosis in the  sigmoid colon.                           - Internal hemorrhoids. Recommendation:           - Return patient to hospital ward for ongoing care.                           - Full liquid diet.                           - Continue present medications.                           - Await pathology results.                           - Repeat colonoscopy in 2 years for surveillance.                           - Resume Eliquis (apixaban) at prior dose tomorrow. Procedure Code(s):        --- Professional ---                           [phone], Colonoscopy, flexible; with removal of                            tumor(s), polyp(s), or other lesion(s) by snare                            technique                           [id number], 46, Colonoscopy, flexible; with biopsy,                            single or multiple Diagnosis Code(s):        --- Professional ---                           K91.89, Other postprocedural complications and                             disorders of digestive system                           K64.8, Other hemorrhoids                           K63.3, Ulcer of intestine                           K63.5, Polyp of colon                           K60.3, Anal fistula                           K64.4, Residual hemorrhoidal skin  tags                           K92.2, Gastrointestinal hemorrhage, unspecified                           K50.90, Crohn's disease, unspecified, without                            complications                           K57.30, Diverticulosis of large intestine without                            perforation or abscess without bleeding CPT copyright 2019 American Medical Association. All rights reserved. The codes documented in this report are preliminary and upon coder review may  be revised to meet current compliance requirements. Otis Brace, MD Otis Brace, MD 02/02/2020 9:21:58 AM Number of Addenda: 0

## 2020-02-02 NOTE — Brief Op Note (Signed)
01/30/2020 - 02/02/2020  9:48 AM  PATIENT:  Natasha [name]  74 y.o. female  PRE-OPERATIVE DIAGNOSIS:  Anemia  POST-OPERATIVE DIAGNOSIS:  anastomotic and sigmoid colon ulcers- biopsied. Sigmoid polpectomy, random colon bx for Crohn's   PROCEDURE:  Procedure(s): COLONOSCOPY WITH PROPOFOL (N/A) BIOPSY POLYPECTOMY  SURGEON:  Surgeon(s) and Role:    * Rafferty Postlewait, MD - Primary  Findings ---------- -Colonoscopy showed perianal fistula, anastomotic ulcer and anastomotic stenosis.  Not able to advance scope into small intestine.  Biopsies taken. -Also had small ulcer in the sigmoid colon as well as sigmoid colon polyp which was removed with hot snare.  Recommendations ------------------------- -Start full liquid diet and advance as tolerated -Resume anticoagulation tomorrow -Follow biopsy results -May consider CT enterography or small bowel follow-through for evaluation of anastomotic stenosis. -GI will follow  Otis Brace MD, [place] 02/02/2020, 9:49 AM  Contact #  [phone]

## 2020-02-02 NOTE — Progress Notes (Signed)
PROGRESS NOTE    Natasha [name]  GLO:[id number] DOB: 1945-12-22 DOA: 01/30/2020 PCP: [name], MD   Brief Narrative: 74 year old with past medical history significant for ESRD on hemodialysis on MWF, Crohn's disease on Humira and prednisone, lung lesion concerning for lung cancer, anemia, thrombocytopenia, SVT, history of PE on Eliquis present to the ER because of increasing weakness which is going on for the last week.  Patient also noticed black stool over the last week.  Patient is on chronically home oxygen 3 to 4 L due to COPD.  Chest x-ray shows lesion concerning for metastasis.  Labs were significant for low hemoglobin, 6.8.  Occult blood positive.  Patient was ordered 1 unit of packed red blood cells and admitted for acute GI bleed.  Covid test negative.  Patient underwent endoscopy on 9/7; diffuse mild inflammation characterized by congestion and erythema was found in the entire examined stomach.  A medium nonbleeding diverticulum was found in the gastric fundus.  Duodenitis. Patient underwent colonoscopy 9/09; perianal fistula, perianal skin tag, non- patent end to side ileocolonic anastomosis, characterized by edema, erosion and ulceration.   Assessment & Plan:   Principal Problem:   Acute GI bleeding Active Problems:   Crohn's disease without complication (HCC)   Hypothyroidism   Emphysema/COPD (HCC)   Chronic diastolic CHF (congestive heart failure) (HCC)   Hx of Pulmonary embolism (HCC)   Metastatic lung carcinoma, left (HCC)   Acute blood loss anemia   1-Acute on chronic blood loss anemia, iron deficiency anemia -Concern for GI bleed due to melena on admission and anemia. -Underwent endoscopy on 9/7: Showed gastritis, gastric diverticulum and duodenitis -Colonoscopy:  perianal fistula, perianal skin tag, non- patent end  to side ileocolonic anastomosis, characterized by edema, erosion and ulceration. -Status post 1 unit of packed red blood cells on 9/6  and Feraheme on 9/7 -Hb down to 7.4,  continue to monitor.  -Plan to resume and advance diet as tolerated.  -might be able to resume anticoagulation tomorrow. May consider CT enterography or small bowel follow through for evaluation of anastomotic stenosis. Will follow GI recommendations.   2-ESRD on Monday Wednesday and Friday dialysis. Had HD 9/08  3-History of Crohn's disease: Holding home Humira and oral  Steroid. n IVF steroid  4-Paroxysmal A. fib, history of SVT Holding Eliquis and Cardizem for now.  Chronic Gastritis with H. Pylori;  Started on Amoxicillin, clarithromycin and PPI.   History of PE and DVT: Occurred  years ago. Might be able to resume anticoagulation tomorrow.  Per prior oncology note, patient didn't need life long anticoagulation, but that was prior to new diagnosis of possible lung ca. Will try to contact Dr Irene Limbo to further discussed. .   Hypothyroidism: Continue with Synthroid History of possible metastatic lung cancer: Continue follow-up with oncology and pulmonology  Nutrition Problem: Moderate Malnutrition Etiology: chronic illness (ESRD on HD)    Signs/Symptoms: mild muscle depletion, mild fat depletion    Interventions: MVI, Snacks  Estimated body mass index is 18.92 kg/m as calculated from the following:   Height as of this encounter: 5' 4"  (1.626 m).   Weight as of this encounter: 50 kg.   DVT prophylaxis: SCD Code Status: Full code Family Communication: Discussed with patient.  Disposition Plan:  Status is: Inpatient  Remains inpatient appropriate because:Altered mental status and Ongoing diagnostic testing needed not appropriate for outpatient work up   Dispo: The patient is from: Home  Anticipated d/c is to: Home              Anticipated d/c date is: 2 days              Patient currently is not medically stable to d/c.  Plan for colonoscopy 9/9        Consultants:   GI  Nephrology  Procedures:    Endoscopy 9/7  Antimicrobials:  None  Subjective: Patient came from colonoscopy. She denies abdominal pain.  We talk about her decision of not proceeding for Bx of her lung mass. She is afraid of procedure, her brother never came off after he had procedure for lung bx. I encourage her to discussed with her Pulmonologist and oncology her personal risk.    Objective: Vitals:   02/02/20 0925 02/02/20 0930 02/02/20 0935 02/02/20 0959  BP: (!) 153/56  (!) 133/54 (!) 149/74  Pulse: 66 69 80 70  Resp: 16 17 13 18   Temp:    (!) 97.5 F (36.4 C)  TempSrc:    Oral  SpO2: 100% 100% 100% 100%  Weight:      Height:        Intake/Output Summary (Last 24 hours) at 02/02/2020 1221 Last data filed at 02/02/2020 0906 Gross per 24 hour  Intake 1870 ml  Output 5 ml  Net 1865 ml   Filed Weights   02/01/20 0739 02/01/20 1112 02/02/20 0732  Weight: 51.5 kg 50 kg 50 kg    Examination:  General exam: NAD Respiratory system: CTA Cardiovascular system: S 1, S 2 RRR Gastrointestinal system: BS present, soft, nt Central nervous system: Alert and oriented Extremities: no edema   Data Reviewed: I have personally reviewed following labs and imaging studies  CBC: Recent Labs  Lab 01/30/20 1741 01/30/20 1741 01/30/20 1810 01/30/20 1811 01/31/20 0557 02/01/20 0500 02/02/20 0415  WBC 5.0  --   --   --  6.7 6.7 5.0  NEUTROABS 3.6  --   --   --   --  5.5 3.4  HGB 6.8*   < > 7.5* 7.5* 8.2*  8.2* 8.2* 7.4*  HCT 23.2*   < > 22.0* 22.0* 28.2*  27.3* 26.9* 23.8*  MCV 104.0*  --   --   --  100.4* 96.8 94.4  PLT 126*  --   --   --  118* 131* 119*   < > = values in this interval not displayed.   Basic Metabolic Panel: Recent Labs  Lab 01/30/20 1741 01/30/20 1741 01/30/20 1810 01/30/20 1811 01/31/20 0557 02/01/20 0500 02/02/20 0415  NA 134*   < > 135 135 137 134* 136  K 4.5   < > 4.5 4.4 3.9 4.7 3.8  CL 102  --   --  103 104 103 100  CO2 18*  --   --   --  17* 14* 23  GLUCOSE 106*   --   --  96 72 68* 86  BUN 52*  --   --  53* 56* 67* 30*  CREATININE 10.05*  --   --  11.10* 10.69* 12.34* 7.25*  CALCIUM 9.0  --   --   --  8.6* 8.5* 8.2*  PHOS  --   --   --   --   --  11.1* 7.0*   < > = values in this interval not displayed.   GFR: Estimated Creatinine Clearance: 5.5 mL/min (A) (by C-G formula based on SCr of 7.25 mg/dL (H)). Liver Function  Tests: Recent Labs  Lab 02/01/20 0500 02/02/20 0415  ALBUMIN 3.0* 2.7*   No results for input(s): LIPASE, AMYLASE in the last 168 hours. No results for input(s): AMMONIA in the last 168 hours. Coagulation Profile: No results for input(s): INR, PROTIME in the last 168 hours. Cardiac Enzymes: No results for input(s): CKTOTAL, CKMB, CKMBINDEX, TROPONINI in the last 168 hours. BNP (last 3 results) No results for input(s): PROBNP in the last 8760 hours. HbA1C: No results for input(s): HGBA1C in the last 72 hours. CBG: No results for input(s): GLUCAP in the last 168 hours. Lipid Profile: No results for input(s): CHOL, HDL, LDLCALC, TRIG, CHOLHDL, LDLDIRECT in the last 72 hours. Thyroid Function Tests: No results for input(s): TSH, T4TOTAL, FREET4, T3FREE, THYROIDAB in the last 72 hours. Anemia Panel: Recent Labs    01/30/20 2106  VITAMINB12 1,062*  FOLATE 7.8  FERRITIN 1,192*  TIBC 269  IRON 8*  RETICCTPCT 1.9   Sepsis Labs: No results for input(s): PROCALCITON, LATICACIDVEN in the last 168 hours.  Recent Results (from the past 240 hour(s))  SARS Coronavirus 2 by RT PCR (hospital order, performed in [facility] hospital lab) Nasopharyngeal Nasopharyngeal Swab     Status: None   Collection Time: 01/30/20  5:37 PM   Specimen: Nasopharyngeal Swab  Result Value Ref Range Status   SARS Coronavirus 2 NEGATIVE NEGATIVE Final    Comment: (NOTE) SARS-CoV-2 target nucleic acids are NOT DETECTED.  The SARS-CoV-2 RNA is generally detectable in upper and lower respiratory specimens during the acute phase of infection. The  lowest concentration of SARS-CoV-2 viral copies this assay can detect is 250 copies / mL. A negative result does not preclude SARS-CoV-2 infection and should not be used as the sole basis for treatment or other patient management decisions.  A negative result may occur with improper specimen collection / handling, submission of specimen other than nasopharyngeal swab, presence of viral mutation(s) within the areas targeted by this assay, and inadequate number of viral copies (<250 copies / mL). A negative result must be combined with clinical observations, patient history, and epidemiological information.  Fact Sheet for Patients:   StrictlyIdeas.no  Fact Sheet for Healthcare Providers: BankingDealers.co.za  This test is not yet approved or  cleared by the [place] FDA and has been authorized for detection and/or diagnosis of SARS-CoV-2 by FDA under an Emergency Use Authorization (EUA).  This EUA will remain in effect (meaning this test can be used) for the duration of the COVID-19 declaration under Section 564(b)(1) of the Act, 21 U.S.C. section 360bbb-3(b)(1), unless the authorization is terminated or revoked sooner.  Performed at [facility], [place] [address] [place], [place] [address]   MRSA PCR Screening     Status: None   Collection Time: 01/31/20  1:46 AM   Specimen: Nasopharyngeal  Result Value Ref Range Status   MRSA by PCR NEGATIVE NEGATIVE Final    Comment:        The GeneXpert MRSA Assay (FDA approved for NASAL specimens only), is one component of a comprehensive MRSA colonization surveillance program. It is not intended to diagnose MRSA infection nor to guide or monitor treatment for MRSA infections. Performed at [facility], [place] [address] [place], [place] [address]          Radiology Studies: No results found.      Scheduled Meds: . albuterol  2.5 mg Inhalation BID  .  amoxicillin  500 mg Oral Q12H  . calcium acetate  667 mg Oral TID WC  . Chlorhexidine Gluconate Cloth  6 each Topical Q0600  . clarithromycin  250 mg Oral Q12H  . doxercalciferol  3 mcg Intravenous Q M,W,F-HD  . fluticasone furoate-vilanterol  1 puff Inhalation Daily  . hydrocortisone sod succinate (SOLU-CORTEF) inj  50 mg Intravenous Q8H  . levothyroxine  125 mcg Oral Q0600  . midodrine  10 mg Oral TID WC  . mineral oil  1 enema Rectal Once  . pantoprazole (PROTONIX) IV  40 mg Intravenous Q12H  . umeclidinium bromide  1 puff Inhalation Daily   Continuous Infusions:   LOS: 3 days    Time spent: 35 minutes.     Elmarie Shiley, MD [facility]   If 7PM-7AM, please contact night-coverage [email or web address]  02/02/2020, 12:21 PM

## 2020-02-02 NOTE — Anesthesia Postprocedure Evaluation (Signed)
Anesthesia Post Note  Patient: Natasha [name]  Procedure(s) Performed: ESOPHAGOGASTRODUODENOSCOPY (EGD) WITH PROPOFOL (N/A ) BIOPSY     Patient location during evaluation: Endoscopy Anesthesia Type: MAC Level of consciousness: awake and alert Pain management: pain level controlled Vital Signs Assessment: post-procedure vital signs reviewed and stable Respiratory status: spontaneous breathing, nonlabored ventilation, respiratory function stable and patient connected to nasal cannula oxygen Cardiovascular status: stable and blood pressure returned to baseline Postop Assessment: no apparent nausea or vomiting Anesthetic complications: no   No complications documented.  Last Vitals:  Vitals:   02/02/20 0930 02/02/20 0935  BP:  (!) 133/54  Pulse: 69 80  Resp: 17 13  Temp:    SpO2: 100% 100%    Last Pain:  Vitals:   02/02/20 0935  TempSrc:   PainSc: 0-No pain                 Kainoah Bartosiewicz

## 2020-02-02 NOTE — Progress Notes (Signed)
[facility] Progress Note   Subjective:   Patient seen in room. Had colonoscopy this AM with biopsies positive for H. Pylori, has been started on Protonix, amoxicillin and clarithromycin. Patient reports she is feeling better today. SOB improved, jaw pain is resolved. No CP, palpitations or dizziness.   Objective Vitals:   02/02/20 0925 02/02/20 0930 02/02/20 0935 02/02/20 0959  BP: (!) 153/56  (!) 133/54 (!) 149/74  Pulse: 66 69 80 70  Resp: 16 17 13 18   Temp:    (!) 97.5 F (36.4 C)  TempSrc:    Oral  SpO2: 100% 100% 100% 100%  Weight:      Height:       Physical Exam General: Well developed female, alert and in NAD, eating lunch Heart: RRR, no murmurs, rubs or gallops Lungs: CTA bilaterally with decreased breath sounds, no wheezing or rales Abdomen: Soft, non-tender, non-distended, +BS Extremities: No edema b/l lower extremities Dialysis Access: RUE AVG + bruit  Additional Objective Labs: Basic Metabolic Panel: Recent Labs  Lab 01/31/20 0557 02/01/20 0500 02/02/20 0415  NA 137 134* 136  K 3.9 4.7 3.8  CL 104 103 100  CO2 17* 14* 23  GLUCOSE 72 68* 86  BUN 56* 67* 30*  CREATININE 10.69* 12.34* 7.25*  CALCIUM 8.6* 8.5* 8.2*  PHOS  --  11.1* 7.0*   Liver Function Tests: Recent Labs  Lab 02/01/20 0500 02/02/20 0415  ALBUMIN 3.0* 2.7*   CBC: Recent Labs  Lab 01/30/20 1741 01/30/20 1810 01/31/20 0557 02/01/20 0500 02/02/20 0415  WBC 5.0   < > 6.7 6.7 5.0  NEUTROABS 3.6  --   --  5.5 3.4  HGB 6.8*   < > 8.2*  8.2* 8.2* 7.4*  HCT 23.2*   < > 28.2*  27.3* 26.9* 23.8*  MCV 104.0*  --  100.4* 96.8 94.4  PLT 126*   < > 118* 131* 119*   < > = values in this interval not displayed.   Blood Culture    Component Value Date/Time   SDES BLOOD RIGHT ARM 04/11/2019 1340   SPECREQUEST  04/11/2019 1340    BOTTLES DRAWN AEROBIC AND ANAEROBIC Blood Culture results may not be optimal due to an inadequate volume of blood received in culture bottles    CULT  04/11/2019 1340    NO GROWTH 5 DAYS Performed at [facility], [place] [address] [place], [place] [address]    REPTSTATUS 04/16/2019 FINAL 04/11/2019 1340    Iron Studies:  Recent Labs    01/30/20 2106  IRON 8*  TIBC 269  FERRITIN 1,192*   Medications:  . albuterol  2.5 mg Inhalation BID  . amoxicillin  500 mg Oral Q12H  . calcium acetate  667 mg Oral TID WC  . Chlorhexidine Gluconate Cloth  6 each Topical Q0600  . clarithromycin  250 mg Oral Q12H  . doxercalciferol  3 mcg Intravenous Q M,W,F-HD  . fluticasone furoate-vilanterol  1 puff Inhalation Daily  . hydrocortisone sod succinate (SOLU-CORTEF) inj  50 mg Intravenous Q8H  . levothyroxine  125 mcg Oral Q0600  . midodrine  10 mg Oral TID WC  . mineral oil  1 enema Rectal Once  . pantoprazole (PROTONIX) IV  40 mg Intravenous Q12H  . umeclidinium bromide  1 puff Inhalation Daily    [facility], 4 hours, BFR 300, DFR 800, EDW 50kg, 3K/2.25 Ca, UF Prof 3, AVG 16g, heparin 4200 unit bolus  No ESA due to likely malignancy Hectorol 3 mcg IV q HD Calcium acetate 2 caps TID with meals  Assessment/Plan: 1. Acute on chronic anemia: Hgb 6.8 on admission and FOBT +. Received 1 unit PRBC. Hx of Crohns and has had GI bleeds in the past. Work up per GI, s/p endoscopy/colonscopy and biopsy positive for H. Pylori. Hgb 7.4 today. Not on ESA due to likely malignancy. Hgb 8.2 yesterday, AM labs pending. Tsat 8%, ferritin 1192, already received dose of feraheme.  2. ESRD:Dialyzes on MWF schedule. Next dialysis 02/03/20.  3. Hypertension/volume:BP slightly high. Takes midodrine TID, may need to decrease dose if BP stays high. No volume overload on exam 4. Anemia:See #1 above. No ESA due to likely malignancy 5. Metabolic bone disease:Calcium controlled. Continue hectorol. Phos 11.1 > 7.0. May not have been taking binders due to GI upset. Continue calcium acetate as  tolerated.  6. A fib: On eliquis, currently on hold. Per primary.  7. COPD: With possible mets on CT scan, followed by oncology and pulmonology. SOB improving today.  8. Crohn's dz: On Humira, followed by GI  Anice Paganini, PA-C 02/02/2020, 12:22 PM  [facility] Pager: [phone]

## 2020-02-02 NOTE — Progress Notes (Signed)
Amoxicillin and clarithromycin were ordered for H.pylori along with Protonix. She is ESRD. We will adjust her regimen based on renal function.  Clarithromycin 27m PO BID x10 days Amoxicillin 5055mPO BID x 10 days  Rx signs off  MiOnnie BoerPharmD, [email or web address], CPP Infectious Disease Pharmacist 02/02/2020 10:17 AM

## 2020-02-02 NOTE — Evaluation (Signed)
Physical Therapy Evaluation Patient Details Name: Natasha [name] MRN: [id number] DOB: [birth year] Today's Date: 02/02/2020   History of Present Illness  Pt is a 74 year old woman admitted with acute GIB with low hgb and weakness. She is now s/p endoscopy/colonscopy and biopsy positive for H. Pylori. PMH: COPD on 4L 02 at baseline, hx of PE, ESRD, Crohns disease, lung lesion concerning for cancer, anemia, thrombocytopenia, SVT.  Clinical Impression  Pt in bed upon arrival of PT, agreeable to evaluation at this time. Prior to admission the pt was mobilizing independently at home, completing ADLs independently, and assisting her husband with IADLs such as cleaning and cooking in the home. The pt now presents with limitations in functional mobility, endurance, strength, and stability due to above dx and decreased activity, and will continue to benefit from skilled PT acutely to address these deficits. The pt will be safe to return home with family support once medically cleared, and will benefit from skilled PT to work on progression of gait and dynamic stability acutely to facilitate return to prior level of mobility and independence following d/c.      Follow Up Recommendations No PT follow up    Equipment Recommendations  None recommended by PT    Recommendations for Other Services       Precautions / Restrictions Precautions Precautions: None Restrictions Weight Bearing Restrictions: No      Mobility  Bed Mobility Overal bed mobility: Modified Independent             General bed mobility comments: HOB up  Transfers Overall transfer level: Needs assistance Equipment used: None Transfers: Sit to/from Stand Sit to Stand: Supervision         General transfer comment: offered HHA, but pt able to stand without assist. no assist to steady in standing either.  Ambulation/Gait Ambulation/Gait assistance: Supervision Gait Distance (Feet): 150 Feet Assistive device:  None Gait Pattern/deviations: Step-through pattern Gait velocity: 0.25 m/s Gait velocity interpretation: <1.31 ft/sec, indicative of household ambulator General Gait Details: pt with significantly slowed gait, but no LOB during ambulation.     Balance Overall balance assessment: Mild deficits observed, not formally tested                                           Pertinent Vitals/Pain Pain Assessment: No/denies pain    Home Living Family/patient expects to be discharged to:: Private residence Living Arrangements: Spouse/significant other Available Help at Discharge: Family;Available 24 hours/day Type of Home: House Home Access: Level entry     Home Layout: One level Home Equipment: Walker - 2 wheels;Cane - single point      Prior Function Level of Independence: Independent         Comments: Pt on 3-4L home O2 at baseline, stands to shower (does not wear her 02 in shower), works with husband to complete IADL.     Hand Dominance   Dominant Hand: Right    Extremity/Trunk Assessment   Upper Extremity Assessment Upper Extremity Assessment: Overall WFL for tasks assessed    Lower Extremity Assessment Lower Extremity Assessment: Overall WFL for tasks assessed    Cervical / Trunk Assessment Cervical / Trunk Assessment: Normal  Communication   Communication: No difficulties  Cognition Arousal/Alertness: Awake/alert Behavior During Therapy: WFL for tasks assessed/performed Overall Cognitive Status: Within Functional Limits for tasks assessed  General Comments General comments (skin integrity, edema, etc.): SpO2 100% on 4L at rest, 98% on 4L ambulating.        Assessment/Plan    PT Assessment Patient needs continued PT services  PT Problem List Decreased strength;Decreased mobility;Decreased range of motion;Decreased activity tolerance;Decreased balance       PT Treatment  Interventions Therapeutic exercise;Gait training;Stair training;Functional mobility training;Therapeutic activities;Patient/family education;Balance training    PT Goals (Current goals can be found in the Care Plan section)  Acute Rehab PT Goals Patient Stated Goal: find out why she is bleeding PT Goal Formulation: With patient Time For Goal Achievement: 02/16/20 Potential to Achieve Goals: Good    Frequency Min 3X/week    AM-PAC PT "6 Clicks" Mobility  Outcome Measure Help needed turning from your back to your side while in a flat bed without using bedrails?: None Help needed moving from lying on your back to sitting on the side of a flat bed without using bedrails?: None Help needed moving to and from a bed to a chair (including a wheelchair)?: A Little Help needed standing up from a chair using your arms (e.g., wheelchair or bedside chair)?: None Help needed to walk in hospital room?: A Little Help needed climbing 3-5 steps with a railing? : A Little 6 Click Score: 21    End of Session Equipment Utilized During Treatment: Gait belt;Oxygen (4L) Activity Tolerance: Patient tolerated treatment well Patient left: in chair;with chair alarm set;with call bell/phone within reach Nurse Communication: Mobility status PT Visit Diagnosis: Difficulty in walking, not elsewhere classified (R26.2);Muscle weakness (generalized) (M62.81)    Time: 1331-1400 PT Time Calculation (min) (ACUTE ONLY): 29 min   Charges:   PT Evaluation $PT Eval Moderate Complexity: 1 Mod PT Treatments $Gait Training: 8-22 mins       Karma Ganja, PT, DPT   Acute Rehabilitation Department Pager #: [phone]  Otho Bellows 02/02/2020, 3:41 PM

## 2020-02-03 LAB — RENAL FUNCTION PANEL
Albumin: 3.3 g/dL — ABNORMAL LOW (ref 3.5–5.0)
Anion gap: 11 (ref 5–15)
BUN: 22 mg/dL (ref 8–23)
CO2: 24 mmol/L (ref 22–32)
Calcium: 8.5 mg/dL — ABNORMAL LOW (ref 8.9–10.3)
Chloride: 100 mmol/L (ref 98–111)
Creatinine, Ser: 5.86 mg/dL — ABNORMAL HIGH (ref 0.44–1.00)
GFR calc Af Amer: 8 mL/min — ABNORMAL LOW (ref >60)
GFR calc non Af Amer: 7 mL/min — ABNORMAL LOW (ref >60)
Glucose, Bld: 111 mg/dL — ABNORMAL HIGH (ref 70–99)
Phosphorus: 4.7 mg/dL — ABNORMAL HIGH (ref 2.5–4.6)
Potassium: 3.2 mmol/L — ABNORMAL LOW (ref 3.5–5.1)
Sodium: 135 mmol/L (ref 135–145)

## 2020-02-03 LAB — CBC WITH DIFFERENTIAL/PLATELET
Abs Immature Granulocytes: 0.02 10*3/uL (ref 0.00–0.07)
Basophils Absolute: 0 10*3/uL (ref 0.0–0.1)
Basophils Relative: 0 %
Eosinophils Absolute: 0 10*3/uL (ref 0.0–0.5)
Eosinophils Relative: 0 %
HCT: 25.5 % — ABNORMAL LOW (ref 36.0–46.0)
Hemoglobin: 8.5 g/dL — ABNORMAL LOW (ref 12.0–15.0)
Immature Granulocytes: 1 %
Lymphocytes Relative: 12 %
Lymphs Abs: 0.5 10*3/uL — ABNORMAL LOW (ref 0.7–4.0)
MCH: 31.5 pg (ref 26.0–34.0)
MCHC: 33.3 g/dL (ref 30.0–36.0)
MCV: 94.4 fL (ref 80.0–100.0)
Monocytes Absolute: 0.3 10*3/uL (ref 0.1–1.0)
Monocytes Relative: 6 %
Neutro Abs: 3.4 10*3/uL (ref 1.7–7.7)
Neutrophils Relative %: 81 %
Platelets: 115 10*3/uL — ABNORMAL LOW (ref 150–400)
RBC: 2.7 MIL/uL — ABNORMAL LOW (ref 3.87–5.11)
RDW: 17.3 % — ABNORMAL HIGH (ref 11.5–15.5)
WBC: 4.2 10*3/uL (ref 4.0–10.5)
nRBC: 0 % (ref 0.0–0.2)

## 2020-02-03 LAB — SURGICAL PATHOLOGY

## 2020-02-03 MED ORDER — LEVOFLOXACIN 250 MG PO TABS: 250.0000 mg | ORAL_TABLET | ORAL | 0 refills | Status: DC

## 2020-02-03 MED ORDER — LEVOFLOXACIN 500 MG PO TABS: 250.0000 mg | ORAL_TABLET | ORAL | Status: DC

## 2020-02-03 MED ORDER — ONDANSETRON 4 MG PO TBDP: 4.0000 mg | ORAL_TABLET | Freq: Three times a day (TID) | ORAL | 0 refills | Status: AC | PRN

## 2020-02-03 MED ORDER — OYSTER SHELL CALCIUM/D 500-200 MG-UNIT PO TABS: 1.0000 | ORAL_TABLET | ORAL | 1 refills | Status: AC | PRN

## 2020-02-03 MED ORDER — PANTOPRAZOLE SODIUM 40 MG PO TBEC: 40.0000 mg | DELAYED_RELEASE_TABLET | Freq: Two times a day (BID) | ORAL | 1 refills | Status: AC

## 2020-02-03 MED ORDER — DOXERCALCIFEROL 4 MCG/2ML IV SOLN
INTRAVENOUS | Status: AC
  Administered 2020-02-03: 3 ug via INTRAVENOUS
  Filled 2020-02-03: qty 2

## 2020-02-03 MED ORDER — LEVOFLOXACIN 500 MG PO TABS
500.0000 mg | ORAL_TABLET | Freq: Once | ORAL | Status: AC
  Administered 2020-02-03: 500 mg via ORAL
  Filled 2020-02-03: qty 1

## 2020-02-03 MED ORDER — CALCITRIOL 0.25 MCG PO CAPS: 0.2500 ug | ORAL_CAPSULE | ORAL | 1 refills | Status: AC

## 2020-02-03 MED ORDER — CALCIUM ACETATE (PHOS BINDER) 667 MG PO CAPS: 667.0000 mg | ORAL_CAPSULE | Freq: Three times a day (TID) | ORAL | 0 refills | Status: AC

## 2020-02-03 MED ORDER — MIDODRINE HCL 5 MG PO TABS
ORAL_TABLET | ORAL | Status: AC
  Administered 2020-02-03: 10 mg via ORAL
  Filled 2020-02-03: qty 2

## 2020-02-03 MED ORDER — AMOXICILLIN 500 MG PO CAPS: 500.0000 mg | ORAL_CAPSULE | Freq: Two times a day (BID) | ORAL | 0 refills | Status: AC

## 2020-02-03 MED FILL — PANTOPRAZOLE SOD DR 40 MG T: 40 | 30 days supply | Qty: 60 | Fill #0

## 2020-02-03 MED FILL — AMOXICILLIN 500 MG CAPS: 500 | 10 days supply | Qty: 20 | Fill #0

## 2020-02-03 MED FILL — CALCITRIOL 0.25 MCG CAPS: 0.25 | 58 days supply | Qty: 30 | Fill #0

## 2020-02-03 MED FILL — levoFLOXacin 250 MG TABS: 250 | 10 days supply | Qty: 5 | Fill #0

## 2020-02-03 MED FILL — ONDANSETRON ODT 4 MG TABLET: 4 | 6 days supply | Qty: 20 | Fill #0

## 2020-02-03 MED FILL — CALCIUM 600-VIT D3 400 TAB: 600-400 | 30 days supply | Qty: 30 | Fill #0

## 2020-02-03 NOTE — Discharge Summary (Addendum)
Physician Discharge Summary  Natasha [name] PZW:[id number] DOB: [birth year] DOA: 01/30/2020  PCP: [name], MD  Admit date: 01/30/2020 Discharge date: 02/03/2020  Admitted From: Home Disposition:  Home   Recommendations for Outpatient Follow-up:  1. Follow up with PCP in 1-2 weeks 2. Please obtain BMP/CBC in one week 3. Follow up with Pulmonary to discussed Lung Biopsy results.  4. Follow up with GI for further care of Crohn's diseases, fistula.   Home Health: None  Discharge Condition: Stable.  CODE STATUS: Full code Diet recommendation: Heart Healthy  Brief/Interim Summary: 74 year old with past medical history significant for ESRD on hemodialysis on MWF, Crohn's disease on Humira and prednisone, lung lesion concerning for lung cancer, anemia, thrombocytopenia, SVT, history of PE on Eliquis present to the ER because of increasing weakness which is going on for the last week.  Patient also noticed black stool over the last week.  Patient is on chronically home oxygen 3 to 4 L due to COPD.  Chest x-ray shows lesion concerning for metastasis.  Labs were significant for low hemoglobin, 6.8.  Occult blood positive.  Patient was ordered 1 unit of packed red blood cells and admitted for acute GI bleed.  Covid test negative.  Patient underwent endoscopy on 9/7; diffuse mild inflammation characterized by congestion and erythema was found in the entire examined stomach.  A medium nonbleeding diverticulum was found in the gastric fundus.  Duodenitis. Patient underwent colonoscopy 9/09; perianal fistula, perianal skin tag, non- patent end to side ileocolonic anastomosis, characterized by edema, erosion and ulceration.  1-Acute on chronic blood loss anemia, iron deficiency anemia -Concern for GI bleed due to melena on admission and anemia. -Underwent endoscopy on 9/7: Showed gastritis, gastric diverticulum and duodenitis -Colonoscopy:  perianal fistula, perianal skin tag, non- patent  end  to side ileocolonic anastomosis, characterized by edema, erosion and ulceration. -Status post 1 unit of packed red blood cells on 9/6 and Feraheme on 9/7 -GI is not planning  CT enterography or small bowel follow through for evaluation of anastomotic stenosis.  -Hb stable. Discussed with GI, ok to discharge patient.  -had mild nausea after she took oral antibiotics on empty stomach.  -discharge later today after HD>   2-ESRD on Monday Wednesday and Friday dialysis. Had HD 9/08 and today.   3-History of Crohn's disease: Holding home Humira and oral  Steroid. n IVF steroid  4-Paroxysmal A. fib, history of SVT Resume  Eliquis and Cardizem.    Chronic Gastritis with H. Pylori;  Started on Amoxicillin, clarithromycin and PPI. due to interaction between eliquis and clarithromycin regimen for H pylori change to Amoxicillin, Levaquin, PPI BID  History of PE and DVT: Occurred  years ago. Plan to resume eliquis. Discussed with Oncology.    Hypothyroidism: Continue with Synthroid lung mass, with concern of lung cancer: Continue follow-up with oncology and pulmonology  Discharge Diagnoses:  Principal Problem:   Acute GI bleeding Active Problems:   Crohn's disease without complication (HCC)   Hypothyroidism   Emphysema/COPD ([place])   Chronic diastolic CHF (congestive heart failure) (HCC)   Hx of Pulmonary embolism (HCC)   Acute blood loss anemia    Discharge Instructions  Discharge Instructions    Diet - low sodium heart healthy   Complete by: As directed    Increase activity slowly   Complete by: As directed      Allergies as of 02/03/2020      Reactions   Mercaptopurine Other (See Comments)   Caused pancreatitis  Remicade [infliximab] Other (See Comments)   CAUSED JOINT PAIN      Medication List    STOP taking these medications   MIRCERA IJ     TAKE these medications   acetaminophen 500 MG tablet Commonly known as: TYLENOL Take 1,000 mg by mouth every 6  (six) hours as needed for mild pain.   albuterol 108 (90 Base) MCG/ACT inhaler Commonly known as: VENTOLIN HFA INHALE 2 PUFFS BY MOUTH EVERY 6 HOURS IF NEEDED FOR WHEEZING OR SHORTNESS OF BREATH   amoxicillin 500 MG capsule Commonly known as: AMOXIL Take 1 capsule (500 mg total) by mouth every 12 (twelve) hours for 10 days.   calcitRIOL 0.25 MCG capsule Commonly known as: ROCALTROL Take 1 capsule (0.25 mcg total) by mouth every Monday, Wednesday, and Friday with hemodialysis.   calcium acetate 667 MG capsule Commonly known as: PHOSLO Take 1 capsule (667 mg total) by mouth 3 (three) times daily with meals.   calcium-vitamin D 500-200 MG-UNIT Tabs tablet Commonly known as: OSCAL WITH D Take 1 tablet by mouth as needed (with dialysis).   cyanocobalamin 1000 MCG/ML injection Commonly known as: (VITAMIN B-12) Inject 1,000 mcg into the muscle every 30 (thirty) days.   diltiazem 30 MG tablet Commonly known as: CARDIZEM Take 1 tablet (30 mg total) by mouth every 8 (eight) hours.   Eliquis 5 MG Tabs tablet Generic drug: apixaban Take 2.5 mg by mouth 2 (two) times daily. Takes 1/2 tablet (2.70m) bid   fluticasone 50 MCG/ACT nasal spray Commonly known as: FLONASE Place 1 spray into both nostrils daily as needed for allergies.   Humira 40 MG/0.8ML Pskt Generic drug: Adalimumab Inject 40 mg into the skin every 14 (fourteen) days.   ipratropium-albuterol 0.5-2.5 (3) MG/3ML Soln Commonly known as: DUONEB Take 3 mLs by nebulization every 6 (six) hours as needed. What changed: reasons to take this   levofloxacin 250 MG tablet Commonly known as: LEVAQUIN Take 1 tablet (250 mg total) by mouth every other day for 10 days. Start taking on: February 05, 2020   levothyroxine 125 MCG tablet Commonly known as: SYNTHROID Take 125 mcg by mouth daily before breakfast.   lidocaine-prilocaine cream Commonly known as: EMLA Apply 1 application topically every Monday, Wednesday, and  Friday.   loperamide 2 MG capsule Commonly known as: IMODIUM Take 2 capsules (4 mg total) by mouth as needed for diarrhea or loose stools.   midodrine 10 MG tablet Commonly known as: PROAMATINE Take 1 tablet (10 mg total) by mouth 3 (three) times daily with meals.   Nebulizer Devi 1 Device by Does not apply route as needed.   nitroGLYCERIN 0.4 MG SL tablet Commonly known as: NITROSTAT Place 1 tablet (0.4 mg total) under the tongue every 5 (five) minutes as needed for chest pain. What changed: Another medication with the same name was removed. Continue taking this medication, and follow the directions you see here.   ondansetron 4 MG disintegrating tablet Commonly known as: Zofran ODT Take 1 tablet (4 mg total) by mouth every 8 (eight) hours as needed for nausea or vomiting.   OXYGEN Inhale 2-3 L into the lungs See admin instructions. Use every night and during the day as needed for shortness of breath   pantoprazole 40 MG tablet Commonly known as: Protonix Take 1 tablet (40 mg total) by mouth 2 (two) times daily.   predniSONE 5 MG tablet Commonly known as: DELTASONE Take 5 mg by mouth daily with breakfast. What changed: Another medication  with the same name was removed. Continue taking this medication, and follow the directions you see here.   Trelegy Ellipta 100-62.5-25 MCG/INH Aepb Generic drug: Fluticasone-Umeclidin-Vilant Inhale 1 puff into the lungs daily. What changed: Another medication with the same name was removed. Continue taking this medication, and follow the directions you see here.       Follow-up Information    Chesley Mires, MD Follow up.   Specialty: Pulmonary Disease Why: keep appointment on 9/21 Contact information: [address] [place] [address] [phone]              Allergies  Allergen Reactions  . Mercaptopurine Other (See Comments)    Caused pancreatitis  . Remicade [Infliximab] Other (See Comments)    CAUSED  JOINT PAIN    Consultations:  GI  Nephrology    Procedures/Studies: DG Chest Port 1 View  Result Date: 01/30/2020 CLINICAL DATA:  Shortness of breath.  COPD.  On dialysis. EXAM: PORTABLE CHEST 1 VIEW COMPARISON:  11/18/2019 plain film and CT. FINDINGS: Hyperinflation. Left axillary vascular stent. Surgical clips in the lower neck. Mild cardiomegaly. Atherosclerosis in the transverse aorta. Left perihilar lung nodule has enlarged, 2.8 cm today versus 2.1 cm on 11/18/2019. No pleural effusion or pneumothorax. Numerous leads and wires project over the chest. No lobar consolidation. IMPRESSION: Hyperinflation, without acute superimposed disease. Aortic Atherosclerosis (ICD10-I70.0). Enlarging left upper lobe lung nodule, as on prior exams (metastatic disease per CT of 11/18/2019). Electronically Signed   By: Abigail Miyamoto M.D.   On: 01/30/2020 17:30     Subjective: She had mild nausea today, after she took antibiotics.    Discharge Exam: Vitals:   02/03/20 1300 02/03/20 1330  BP: 117/66 103/60  Pulse: 90 93  Resp:    Temp:    SpO2:       General: Pt is alert, awake, not in acute distress Cardiovascular: RRR, S1/S2 +, no rubs, no gallops Respiratory: CTA bilaterally, no wheezing, no rhonchi Abdominal: Soft, NT, ND, bowel sounds + Extremities: no edema, no cyanosis    The results of significant diagnostics from this hospitalization (including imaging, microbiology, ancillary and laboratory) are listed below for reference.     Microbiology: Recent Results (from the past 240 hour(s))  SARS Coronavirus 2 by RT PCR (hospital order, performed in [facility] hospital lab) Nasopharyngeal Nasopharyngeal Swab     Status: None   Collection Time: 01/30/20  5:37 PM   Specimen: Nasopharyngeal Swab  Result Value Ref Range Status   SARS Coronavirus 2 NEGATIVE NEGATIVE Final    Comment: (NOTE) SARS-CoV-2 target nucleic acids are NOT DETECTED.  The SARS-CoV-2 RNA is generally detectable  in upper and lower respiratory specimens during the acute phase of infection. The lowest concentration of SARS-CoV-2 viral copies this assay can detect is 250 copies / mL. A negative result does not preclude SARS-CoV-2 infection and should not be used as the sole basis for treatment or other patient management decisions.  A negative result may occur with improper specimen collection / handling, submission of specimen other than nasopharyngeal swab, presence of viral mutation(s) within the areas targeted by this assay, and inadequate number of viral copies (<250 copies / mL). A negative result must be combined with clinical observations, patient history, and epidemiological information.  Fact Sheet for Patients:   StrictlyIdeas.no  Fact Sheet for Healthcare Providers: BankingDealers.co.za  This test is not yet approved or  cleared by the [place] FDA and has been authorized for detection  and/or diagnosis of SARS-CoV-2 by FDA under an Emergency Use Authorization (EUA).  This EUA will remain in effect (meaning this test can be used) for the duration of the COVID-19 declaration under Section 564(b)(1) of the Act, 21 U.S.C. section 360bbb-3(b)(1), unless the authorization is terminated or revoked sooner.  Performed at [facility], [place] [address], [place], [place] [address]   MRSA PCR Screening     Status: None   Collection Time: 01/31/20  1:46 AM   Specimen: Nasopharyngeal  Result Value Ref Range Status   MRSA by PCR NEGATIVE NEGATIVE Final    Comment:        The GeneXpert MRSA Assay (FDA approved for NASAL specimens only), is one component of a comprehensive MRSA colonization surveillance program. It is not intended to diagnose MRSA infection nor to guide or monitor treatment for MRSA infections. Performed at [facility], [place] [address] [place], [place] [address]      Labs: BNP (last 3 results) Recent Labs     01/30/20 1741  BNP 130.8*   Basic Metabolic Panel: Recent Labs  Lab 01/30/20 1741 01/30/20 1810 01/30/20 1811 01/31/20 0557 02/01/20 0500 02/02/20 0415 02/03/20 1209  NA 134*   < > 135 137 134* 136 135  K 4.5   < > 4.4 3.9 4.7 3.8 3.2*  CL 102   < > 103 104 103 100 100  CO2 18*  --   --  17* 14* 23 24  GLUCOSE 106*   < > 96 72 68* 86 111*  BUN 52*   < > 53* 56* 67* 30* 22  CREATININE 10.05*   < > 11.10* 10.69* 12.34* 7.25* 5.86*  CALCIUM 9.0  --   --  8.6* 8.5* 8.2* 8.5*  PHOS  --   --   --   --  11.1* 7.0* 4.7*   < > = values in this interval not displayed.   Liver Function Tests: Recent Labs  Lab 02/01/20 0500 02/02/20 0415 02/03/20 1209  ALBUMIN 3.0* 2.7* 3.3*   No results for input(s): LIPASE, AMYLASE in the last 168 hours. No results for input(s): AMMONIA in the last 168 hours. CBC: Recent Labs  Lab 01/30/20 1741 01/30/20 1810 01/30/20 1811 01/31/20 0557 02/01/20 0500 02/02/20 0415 02/03/20 1209  WBC 5.0  --   --  6.7 6.7 5.0 4.2  NEUTROABS 3.6  --   --   --  5.5 3.4 3.4  HGB 6.8*   < > 7.5* 8.2*  8.2* 8.2* 7.4* 8.5*  HCT 23.2*   < > 22.0* 28.2*  27.3* 26.9* 23.8* 25.5*  MCV 104.0*  --   --  100.4* 96.8 94.4 94.4  PLT 126*  --   --  118* 131* 119* 115*   < > = values in this interval not displayed.   Cardiac Enzymes: No results for input(s): CKTOTAL, CKMB, CKMBINDEX, TROPONINI in the last 168 hours. BNP: Invalid input(s): POCBNP CBG: No results for input(s): GLUCAP in the last 168 hours. D-Dimer No results for input(s): DDIMER in the last 72 hours. Hgb A1c No results for input(s): HGBA1C in the last 72 hours. Lipid Profile No results for input(s): CHOL, HDL, LDLCALC, TRIG, CHOLHDL, LDLDIRECT in the last 72 hours. Thyroid function studies No results for input(s): TSH, T4TOTAL, T3FREE, THYROIDAB in the last 72 hours.  Invalid input(s): FREET3 Anemia work up No results for input(s): VITAMINB12, FOLATE, FERRITIN, TIBC, IRON, RETICCTPCT in  the last 72 hours. Urinalysis  Component Value Date/Time   COLORURINE YELLOW 11/18/2019 2138   APPEARANCEUR CLEAR 11/18/2019 2138   LABSPEC 1.019 11/18/2019 2138   PHURINE 6.0 11/18/2019 2138   GLUCOSEU 50 (A) 11/18/2019 2138   HGBUR MODERATE (A) 11/18/2019 2138   BILIRUBINUR NEGATIVE 11/18/2019 2138   [place] NEGATIVE 11/18/2019 2138   PROTEINUR 30 (A) 11/18/2019 2138   UROBILINOGEN 0.2 09/09/2014 0850   NITRITE NEGATIVE 11/18/2019 2138   LEUKOCYTESUR NEGATIVE 11/18/2019 2138   Sepsis Labs Invalid input(s): PROCALCITONIN,  WBC,  LACTICIDVEN Microbiology Recent Results (from the past 240 hour(s))  SARS Coronavirus 2 by RT PCR (hospital order, performed in [facility] hospital lab) Nasopharyngeal Nasopharyngeal Swab     Status: None   Collection Time: 01/30/20  5:37 PM   Specimen: Nasopharyngeal Swab  Result Value Ref Range Status   SARS Coronavirus 2 NEGATIVE NEGATIVE Final    Comment: (NOTE) SARS-CoV-2 target nucleic acids are NOT DETECTED.  The SARS-CoV-2 RNA is generally detectable in upper and lower respiratory specimens during the acute phase of infection. The lowest concentration of SARS-CoV-2 viral copies this assay can detect is 250 copies / mL. A negative result does not preclude SARS-CoV-2 infection and should not be used as the sole basis for treatment or other patient management decisions.  A negative result may occur with improper specimen collection / handling, submission of specimen other than nasopharyngeal swab, presence of viral mutation(s) within the areas targeted by this assay, and inadequate number of viral copies (<250 copies / mL). A negative result must be combined with clinical observations, patient history, and epidemiological information.  Fact Sheet for Patients:   StrictlyIdeas.no  Fact Sheet for Healthcare Providers: BankingDealers.co.za  This test is not yet approved or  cleared by the  [place] FDA and has been authorized for detection and/or diagnosis of SARS-CoV-2 by FDA under an Emergency Use Authorization (EUA).  This EUA will remain in effect (meaning this test can be used) for the duration of the COVID-19 declaration under Section 564(b)(1) of the Act, 21 U.S.C. section 360bbb-3(b)(1), unless the authorization is terminated or revoked sooner.  Performed at [facility], [place] [address] [place], [place] [address]   MRSA PCR Screening     Status: None   Collection Time: 01/31/20  1:46 AM   Specimen: Nasopharyngeal  Result Value Ref Range Status   MRSA by PCR NEGATIVE NEGATIVE Final    Comment:        The GeneXpert MRSA Assay (FDA approved for NASAL specimens only), is one component of a comprehensive MRSA colonization surveillance program. It is not intended to diagnose MRSA infection nor to guide or monitor treatment for MRSA infections. Performed at [facility], [place] [address], [place], [place] [address]      Time coordinating discharge: 40 minutes  SIGNED:   Elmarie Shiley, MD  [facility]

## 2020-02-03 NOTE — [place] Notes (Signed)
DISCHARGE NOTE Zap to be discharged home per MD order. Discussed prescriptions and follow up appointments with the patient. Prescriptions given to patient; medication list explained in detail. Patient verbalized understanding.  Skin clean, dry and intact without evidence of skin break down, no evidence of skin tears noted. IV catheter discontinued intact. Site without signs and symptoms of complications. Dressing and pressure applied. Pt denies pain at the site currently. No complaints noted.  Patient free of lines, drains, and wounds.   An After Visit Summary (AVS) was printed and given to the patient. Patient escorted via wheelchair, and discharged home via private auto.  Berneta Levins, RN

## 2020-02-03 NOTE — [place] Notes (Signed)
[facility] Progress Note   Subjective:seen in route to HD. C/O nausea, wants "real food". Also upset that her BP is so high. Discussed plan to lower dose. HD today on schedule.   Objective Vitals:   02/02/20 1759 02/02/20 2037 02/02/20 2043 02/03/20 0557  BP: (!) 175/68  (!) 150/68 (!) 125/59  Pulse: 73  78 72  Resp: 18  16 18   Temp: 98.4 F (36.9 C)  98.4 F (36.9 C) 97.7 F (36.5 C)  TempSrc: Oral  Oral Oral  SpO2: 100% 98% 100% 100%  Weight:      Height:       Physical Exam General: Pleasant, elderly female in NAD Heart: S1,S2 No M/R/G Lungs: CTAB A/P still slightly decreased RLL Abdomen: active BS Extremities: No LE edema Dialysis Access: R AVG + bruit   Additional Objective Labs: Basic Metabolic Panel: Recent Labs  Lab 01/31/20 0557 02/01/20 0500 02/02/20 0415  NA 137 134* 136  K 3.9 4.7 3.8  CL 104 103 100  CO2 17* 14* 23  GLUCOSE 72 68* 86  BUN 56* 67* 30*  CREATININE 10.69* 12.34* 7.25*  CALCIUM 8.6* 8.5* 8.2*  PHOS  --  11.1* 7.0*   Liver Function Tests: Recent Labs  Lab 02/01/20 0500 02/02/20 0415  ALBUMIN 3.0* 2.7*   No results for input(s): LIPASE, AMYLASE in the last 168 hours. CBC: Recent Labs  Lab 01/30/20 1741 01/30/20 1810 01/31/20 0557 02/01/20 0500 02/02/20 0415  WBC 5.0   < > 6.7 6.7 5.0  NEUTROABS 3.6  --   --  5.5 3.4  HGB 6.8*   < > 8.2*  8.2* 8.2* 7.4*  HCT 23.2*   < > 28.2*  27.3* 26.9* 23.8*  MCV 104.0*  --  100.4* 96.8 94.4  PLT 126*   < > 118* 131* 119*   < > = values in this interval not displayed.   Blood Culture    Component Value Date/Time   SDES BLOOD RIGHT ARM 04/11/2019 1340   SPECREQUEST  04/11/2019 1340    BOTTLES DRAWN AEROBIC AND ANAEROBIC Blood Culture results may not be optimal due to an inadequate volume of blood received in culture bottles   CULT  04/11/2019 1340    NO GROWTH 5 DAYS Performed at [facility], [place] [address], [place], [place] [address]    REPTSTATUS  04/16/2019 FINAL 04/11/2019 1340    Cardiac Enzymes: No results for input(s): CKTOTAL, CKMB, CKMBINDEX, TROPONINI in the last 168 hours. CBG: No results for input(s): GLUCAP in the last 168 hours. Iron Studies: No results for input(s): IRON, TIBC, TRANSFERRIN, FERRITIN in the last 72 hours. @lablastinr3 @ Studies/Results: No results found. Medications:  . albuterol  2.5 mg Inhalation BID  . amoxicillin  500 mg Oral Q12H  . calcium acetate  667 mg Oral TID WC  . Chlorhexidine Gluconate Cloth  6 each Topical Q0600  . clarithromycin  250 mg Oral Q12H  . doxercalciferol  3 mcg Intravenous Q M,W,F-HD  . fluticasone furoate-vilanterol  1 puff Inhalation Daily  . hydrocortisone sod succinate (SOLU-CORTEF) inj  50 mg Intravenous Q8H  . levothyroxine  125 mcg Oral Q0600  . midodrine  10 mg Oral TID WC  . mineral oil  1 enema Rectal Once  . pantoprazole (PROTONIX) IV  40 mg Intravenous Q12H  . umeclidinium bromide  1 puff Inhalation Daily     [facility], 4 hours, BFR 300, DFR 800, EDW  50kg, 3K/2.25 Ca, UF Prof 3, AVG 16g,  -heparin 4200 units IV TIW No ESA due to likely malignancy Hectorol 3 mcg IV TIW Calcium acetate 2 caps PO TID with meals  Assessment/Plan: 1. Acute on chronic anemia: Hgb 6.8 on admission and FOBT +. Received 1 unit PRBC. Hx of Crohns and has had GI bleeds in the past. Work up per GI, s/p endoscopy/colonscopy and biopsy positive for H. Pylori. Hgb 7.4 02/02/2020. Not on ESA due to likely malignancy. Will repeat CBC prior to HD today. [place] 8%, ferritin 1192, already received dose of feraheme. 2. ESRD:MWF via AVG. HD today on schedule.  3. Hypertension/volume:BP is high.Decrease Midodrine to 5 mg PO TID. If still high, decrease dose to Midodrine 10 mg PO TIW 30 minutes prior to [place]. No evidence of volume overload per exam. UF as tolerated in HD today.  4. Anemia:See #1 above. No ESA due to likely  malignancy 5. Metabolic bone disease:Calcium controlled. Continue hectorol. Phos 11.1 > 7.0. May not have been taking binders due to GI upset. Continue calcium acetate as tolerated.  6. A fib: On eliquis, currently on hold. Per primary.  7. COPD: With possible mets on CT scan, followed by oncology and pulmonology.SOB improving today. 8. Crohn's dz: On Humira, followed by GI  Markeis Allman H. Flor Houdeshell NP-C 02/03/2020, 11:01 AM  [employer] [phone]

## 2020-02-03 NOTE — Progress Notes (Signed)
[facility] Gastroenterology Progress Note  Natasha [name] 74 y.o. 12/07/1945  CC: Anemia,   Subjective: Patient seen and examined bedside in dialysis unit.  Had some nausea this morning which has resolved now.  Tolerating diet now.  Denies any bleeding episodes.  ROS : Negative for chest pain.  Afebrile.   Objective: Vital signs in last 24 hours: Vitals:   02/03/20 1300 02/03/20 1330  BP: 117/66 103/60  Pulse: 90 93  Resp:    Temp:    SpO2:      Physical Exam:  General:  Alert, cooperative, no distress, appears stated age  Head:  Normocephalic, without obvious abnormality, atraumatic  Eyes:  , EOM's intact,   Lungs:    No respiratory distress noted  Heart:  Regular rate and rhythm, S1, S2 normal  Abdomen:   Soft, non-tender, nondistended, bowel sounds present   Lab Results: Recent Labs    02/02/20 0415 02/03/20 1209  NA 136 135  K 3.8 3.2*  CL 100 100  CO2 23 24  GLUCOSE 86 111*  BUN 30* 22  CREATININE 7.25* 5.86*  CALCIUM 8.2* 8.5*  PHOS 7.0* 4.7*   Recent Labs    02/02/20 0415 02/03/20 1209  ALBUMIN 2.7* 3.3*   Recent Labs    02/02/20 0415 02/03/20 1209  WBC 5.0 4.2  NEUTROABS 3.4 3.4  HGB 7.4* 8.5*  HCT 23.8* 25.5*  MCV 94.4 94.4  PLT 119* 115*   No results for input(s): LABPROT, INR in the last 72 hours.    Assessment/Plan: -Anemia with dark and heme positive stool.  EGD 9/7  showed mild gastritis without any evidence of bleeding.  Biopsies positive for H. Pylori.  Colonoscopy showed perianal fistula, anastomotic stenosis and anastomotic ulcer.  Also had small ulcer in the sigmoid colon and one sigmoid colon polyp. -H. pylori gastritis. -Acute blood loss anemia.  Status post blood transfusion.  Hemoglobin stable. -History of pulmonary embolism on Eliquis.  Currently on hold. 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  Currently on Humira. -End-stage renal disease on dialysis -COPD on home oxygen.   Currently not in exacerbation.   Recommendations ------------------------- -Amoxicillin and Levaquin for H. pylori treatment per pharmacy recommendations. -Okay to resume anticoagulation from GI standpoint -Recommend outpatient CT enterography or small bowel follow-through for evaluation of anastomotic stricture/Crohn's disease of small intestine -Okay to discharge from GI standpoint.  Follow-up with GI in few weeks after discharge for further management of her Crohn's disease. -GI will sign off.  Call us back if needed  Otis Brace MD, [place] 02/03/2020, 2:10 PM  Contact #  [phone]

## 2020-02-03 NOTE — Plan of Care (Signed)
  Problem: Education: Goal: Knowledge of General Education information will improve Description: Including pain rating scale, medication(s)/side effects and non-pharmacologic comfort measures Outcome: Progressing   Problem: Clinical Measurements: Goal: Respiratory complications will improve Outcome: Progressing   

## 2020-02-03 NOTE — Progress Notes (Signed)
Due to the interaction between apixaban and clarithromycin for her h.pylori, we will change the regimen to amoxicillin/levofloxacin/protonix x10 day. Doses have been adjusted for HD.  Amoxicillin 538m PO BID x10d Levaquin 5067mx1 then 25044mO q48 x10d Protonix 66m28m BID   MinhOnnie BoerarmD, BCID[place]landHIVP, CPP Infectious Disease Pharmacist 02/03/2020 1:46 PM

## 2020-02-03 NOTE — Progress Notes (Signed)
Physical Therapy Treatment Patient Details Name: Natasha [name] MRN: [id number] DOB: June 21, 1945 Today's Date: 02/03/2020    History of Present Illness Pt is a 74 year old woman admitted with acute GIB with low hgb and weakness. She is now s/p endoscopy/colonscopy and biopsy positive for H. Pylori. PMH: COPD on 4L 02 at baseline, hx of PE, ESRD, Crohns disease, lung lesion concerning for cancer, anemia, thrombocytopenia, SVT.    PT Comments    The pt is progressing well with therapy goals, and was able to demo good independence with bed mobility and transfers during this session. The pt continues to require 4L of O2 for OOB mobility, and fatigues quickly due to limited endurance. The pt would benefit from a rollator for longer distance community ambulation (into HD for example), but is safe to return home with assist from family at this time. All education has been completed and the pt is agreeable and eager to return home.     Follow Up Recommendations  No PT follow up     Equipment Recommendations  Other (comment) (rollator)    Recommendations for Other Services       Precautions / Restrictions Precautions Precautions: None Restrictions Weight Bearing Restrictions: No    Mobility  Bed Mobility Overal bed mobility: Modified Independent             General bed mobility comments: HOB up  Transfers Overall transfer level: Modified independent Equipment used: None Transfers: Sit to/from Stand Sit to Stand: Supervision         General transfer comment: pt using BSC without staff present, supervision for O2 line management only  Ambulation/Gait Ambulation/Gait assistance: Supervision Gait Distance (Feet): 75 Feet Assistive device: None Gait Pattern/deviations: Step-through pattern;Decreased stride length;Narrow base of support Gait velocity: 0.25 m/s Gait velocity interpretation: <1.31 ft/sec, indicative of household ambulator General Gait Details: pt with very  slowed gait, she was able to improve speed during one trial with significant encouragement.      Balance Overall balance assessment: Mild deficits observed, not formally tested                                          Cognition Arousal/Alertness: Awake/alert Behavior During Therapy: WFL for tasks assessed/performed Overall Cognitive Status: Within Functional Limits for tasks assessed                                        Exercises      General Comments General comments (skin integrity, edema, etc.): SpO2 98-100% on 4L, dipped to 95% with "fast" walking but recovered to 100% with return to preferred gait      Pertinent Vitals/Pain Pain Assessment: No/denies pain           PT Goals (current goals can now be found in the care plan section) Acute Rehab PT Goals Patient Stated Goal: go home PT Goal Formulation: With patient Time For Goal Achievement: 02/16/20 Potential to Achieve Goals: Good Progress towards PT goals: Progressing toward goals    Frequency    Min 3X/week      PT Plan Current plan remains appropriate       AM-PAC PT "6 Clicks" Mobility   Outcome Measure  Help needed turning from your back to your side while in a flat bed without  using bedrails?: None Help needed moving from lying on your back to sitting on the side of a flat bed without using bedrails?: None Help needed moving to and from a bed to a chair (including a wheelchair)?: A Little Help needed standing up from a chair using your arms (e.g., wheelchair or bedside chair)?: None Help needed to walk in hospital room?: A Little Help needed climbing 3-5 steps with a railing? : A Little 6 Click Score: 21    End of Session Equipment Utilized During Treatment: Gait belt;Oxygen (4L) Activity Tolerance: Patient tolerated treatment well Patient left: in chair;with chair alarm set;with call bell/phone within reach Nurse Communication: Mobility status PT Visit  Diagnosis: Difficulty in walking, not elsewhere classified (R26.2);Muscle weakness (generalized) (M62.81)     Time: 1549-1610 PT Time Calculation (min) (ACUTE ONLY): 21 min  Charges:  $Gait Training: 8-22 mins                     Karma Ganja, PT, DPT   Acute Rehabilitation Department Pager #: [phone]   Otho Bellows 02/03/2020, 4:26 PM

## 2020-02-03 NOTE — Progress Notes (Signed)
Occupational Therapy Treatment Patient Details Name: Natasha [name] MRN: [id number] DOB: 01-19-1946 Today's Date: 02/03/2020    History of present illness Pt is a 74 year old woman admitted with acute GIB with low hgb and weakness. She is now s/p endoscopy/colonscopy and biopsy positive for H. Pylori. PMH: COPD on 4L 02 at baseline, hx of PE, ESRD, Crohns disease, lung lesion concerning for cancer, anemia, thrombocytopenia, SVT.   OT comments  Focus of session on educating pt in energy conservation strategies. Reinforced with written handout. Pt has been routinely using BSC independently. Encouraged pt to continue to participate in ADL maximally with nursing staff.   Follow Up Recommendations  No OT follow up    Equipment Recommendations  Tub/shower seat (rollator)    Recommendations for Other Services      Precautions / Restrictions Precautions Precautions: None       Mobility Bed Mobility Overal bed mobility: Modified Independent             General bed mobility comments: HOB up  Transfers Overall transfer level: Modified independent Equipment used: None             General transfer comment: pt has been routinely using BSC without assist    Balance                                           ADL either performed or assessed with clinical judgement   ADL Overall ADL's : Needs assistance/impaired     Grooming: Supervision/safety;Standing;Oral care               Lower Body Dressing: Set up;Sitting/lateral leans   Toilet Transfer: Modified Independent;BSC   Toileting- Clothing Manipulation and Hygiene: Modified independent         General ADL Comments: Educated pt in energy conservation and provided pt with written handout to reinforce.     Vision       Perception     Praxis      Cognition Arousal/Alertness: Awake/alert Behavior During Therapy: WFL for tasks assessed/performed Overall Cognitive Status: Within  Functional Limits for tasks assessed                                          Exercises     Shoulder Instructions       General Comments      Pertinent Vitals/ Pain          Home Living                                          Prior Functioning/Environment              Frequency  Min 2X/week        Progress Toward Goals  OT Goals(current goals can now be found in the care plan section)  Progress towards OT goals: Progressing toward goals  Acute Rehab OT Goals Patient Stated Goal: find out why she is bleeding OT Goal Formulation: With patient Time For Goal Achievement: 02/15/20 Potential to Achieve Goals: Good  Plan Discharge plan remains appropriate    Co-evaluation  AM-PAC OT "6 Clicks" Daily Activity     Outcome Measure   Help from another person eating meals?: None Help from another person taking care of personal grooming?: A Little Help from another person toileting, which includes using toliet, bedpan, or urinal?: None Help from another person bathing (including washing, rinsing, drying)?: A Little Help from another person to put on and taking off regular upper body clothing?: A Little Help from another person to put on and taking off regular lower body clothing?: A Little 6 Click Score: 20    End of Session Equipment Utilized During Treatment: Oxygen  OT Visit Diagnosis: Muscle weakness (generalized) (M62.81);Other (comment) (decreased activity tolerance)   Activity Tolerance Patient tolerated treatment well   Patient Left in bed;with call bell/phone within reach;with nursing/sitter in room   Nurse Communication          Time: 4967-5916 OT Time Calculation (min): 15 min  Charges: OT General Charges $OT Visit: 1 Visit OT Treatments $Self Care/Home Management : 8-22 mins  Natasha [name], OTR/L Acute Rehabilitation Services Pager: [phone] Office:  [phone]   Natasha [name] 02/03/2020, 9:10 AM

## 2020-02-03 NOTE — Progress Notes (Signed)
PCCM Brief Note  Notified by Dr. Tyrell Antonio that patient was admitted for iron deficiency anemia, suspicion for acute GI blood loss. She follows with Dr. Halford Chessman, has a history of PE and DVT, also bilateral lung nodules hypermetabolic by PET scan.  We had initially planned bronchoscopy but she wanted to cancel this.  She has follow-up with Dr. Halford Chessman on 02/14/2020 in our office.  They will discuss further at that time once she is stabilized from this hospitalization.  Baltazar Apo, MD, PhD 02/03/2020, 9:11 AM [facility] [phone] or if no answer [phone]

## 2020-02-04 ENCOUNTER — Telehealth: Payer: Self-pay | Admitting: Physician Assistant

## 2020-02-04 NOTE — Telephone Encounter (Signed)
Transition of care contact from inpatient facility  Date of discharge: 02/03/20 Date of contact:  02/04/20 Method: Phone Spoke to: Patient  Patient contacted to discuss transition of care from recent inpatient hospitalization. Patient was admitted to [facility] from 01/30/20-02/03/20 with discharge diagnosis of acute on chronic anemia, h. Pylori.  Medication changes were reviewed including antibiotic and PPI dosing. No other needs identified at this time.   Patient will follow up with his/her outpatient HD unit on: 02/06/20

## 2020-02-07 ENCOUNTER — Encounter (HOSPITAL_COMMUNITY): Payer: Self-pay | Admitting: Gastroenterology

## 2020-02-14 ENCOUNTER — Ambulatory Visit: Payer: Medicare Other | Admitting: Pulmonary Disease

## 2020-02-15 ENCOUNTER — Inpatient Hospital Stay (HOSPITAL_COMMUNITY): Payer: Medicare Other

## 2020-02-15 ENCOUNTER — Encounter (HOSPITAL_COMMUNITY): Payer: Self-pay | Admitting: *Deleted

## 2020-02-15 ENCOUNTER — Emergency Department (HOSPITAL_COMMUNITY): Payer: Medicare Other

## 2020-02-15 ENCOUNTER — Inpatient Hospital Stay (HOSPITAL_COMMUNITY)
Admission: EM | Admit: 2020-02-15 | Discharge: 2020-02-21 | DRG: 054 | Disposition: A | Discharge status: Home Health | Payer: Medicare Other | Attending: Internal Medicine | Admitting: Internal Medicine

## 2020-02-15 ENCOUNTER — Other Ambulatory Visit: Payer: Self-pay

## 2020-02-15 DIAGNOSIS — J449 Chronic obstructive pulmonary disease, unspecified: Secondary | ICD-10-CM | POA: Diagnosis present

## 2020-02-15 DIAGNOSIS — Z515 Encounter for palliative care: Secondary | ICD-10-CM

## 2020-02-15 DIAGNOSIS — Z86711 Personal history of pulmonary embolism: Secondary | ICD-10-CM

## 2020-02-15 DIAGNOSIS — Z20822 Contact with and (suspected) exposure to covid-19: Secondary | ICD-10-CM | POA: Diagnosis present

## 2020-02-15 DIAGNOSIS — G936 Cerebral edema: Secondary | ICD-10-CM | POA: Diagnosis present

## 2020-02-15 DIAGNOSIS — I5032 Chronic diastolic (congestive) heart failure: Secondary | ICD-10-CM | POA: Diagnosis present

## 2020-02-15 DIAGNOSIS — K219 Gastro-esophageal reflux disease without esophagitis: Secondary | ICD-10-CM | POA: Diagnosis present

## 2020-02-15 DIAGNOSIS — Z79890 Hormone replacement therapy (postmenopausal): Secondary | ICD-10-CM | POA: Diagnosis not present

## 2020-02-15 DIAGNOSIS — R918 Other nonspecific abnormal finding of lung field: Secondary | ICD-10-CM | POA: Diagnosis not present

## 2020-02-15 DIAGNOSIS — Z87442 Personal history of urinary calculi: Secondary | ICD-10-CM

## 2020-02-15 DIAGNOSIS — G4489 Other headache syndrome: Secondary | ICD-10-CM | POA: Diagnosis not present

## 2020-02-15 DIAGNOSIS — D696 Thrombocytopenia, unspecified: Secondary | ICD-10-CM | POA: Diagnosis not present

## 2020-02-15 DIAGNOSIS — K30 Functional dyspepsia: Secondary | ICD-10-CM | POA: Diagnosis present

## 2020-02-15 DIAGNOSIS — Z66 Do not resuscitate: Secondary | ICD-10-CM

## 2020-02-15 DIAGNOSIS — Z9981 Dependence on supplemental oxygen: Secondary | ICD-10-CM

## 2020-02-15 DIAGNOSIS — Z801 Family history of malignant neoplasm of trachea, bronchus and lung: Secondary | ICD-10-CM

## 2020-02-15 DIAGNOSIS — Z87891 Personal history of nicotine dependence: Secondary | ICD-10-CM | POA: Diagnosis not present

## 2020-02-15 DIAGNOSIS — D631 Anemia in chronic kidney disease: Secondary | ICD-10-CM | POA: Diagnosis present

## 2020-02-15 DIAGNOSIS — Z8249 Family history of ischemic heart disease and other diseases of the circulatory system: Secondary | ICD-10-CM | POA: Diagnosis not present

## 2020-02-15 DIAGNOSIS — D649 Anemia, unspecified: Secondary | ICD-10-CM

## 2020-02-15 DIAGNOSIS — Z833 Family history of diabetes mellitus: Secondary | ICD-10-CM

## 2020-02-15 DIAGNOSIS — R195 Other fecal abnormalities: Secondary | ICD-10-CM

## 2020-02-15 DIAGNOSIS — N186 End stage renal disease: Secondary | ICD-10-CM | POA: Diagnosis present

## 2020-02-15 DIAGNOSIS — E785 Hyperlipidemia, unspecified: Secondary | ICD-10-CM | POA: Diagnosis present

## 2020-02-15 DIAGNOSIS — I132 Hypertensive heart and chronic kidney disease with heart failure and with stage 5 chronic kidney disease, or end stage renal disease: Secondary | ICD-10-CM | POA: Diagnosis present

## 2020-02-15 DIAGNOSIS — I739 Peripheral vascular disease, unspecified: Secondary | ICD-10-CM | POA: Diagnosis present

## 2020-02-15 DIAGNOSIS — C3492 Malignant neoplasm of unspecified part of left bronchus or lung: Secondary | ICD-10-CM | POA: Diagnosis present

## 2020-02-15 DIAGNOSIS — I9589 Other hypotension: Secondary | ICD-10-CM | POA: Diagnosis present

## 2020-02-15 DIAGNOSIS — C7802 Secondary malignant neoplasm of left lung: Secondary | ICD-10-CM

## 2020-02-15 DIAGNOSIS — G939 Disorder of brain, unspecified: Secondary | ICD-10-CM | POA: Diagnosis not present

## 2020-02-15 DIAGNOSIS — Z825 Family history of asthma and other chronic lower respiratory diseases: Secondary | ICD-10-CM | POA: Diagnosis not present

## 2020-02-15 DIAGNOSIS — Z800 Family history of malignant neoplasm of digestive organs: Secondary | ICD-10-CM

## 2020-02-15 DIAGNOSIS — J9611 Chronic respiratory failure with hypoxia: Secondary | ICD-10-CM | POA: Diagnosis present

## 2020-02-15 DIAGNOSIS — D62 Acute posthemorrhagic anemia: Secondary | ICD-10-CM | POA: Diagnosis present

## 2020-02-15 DIAGNOSIS — Z992 Dependence on renal dialysis: Secondary | ICD-10-CM | POA: Diagnosis not present

## 2020-02-15 DIAGNOSIS — Z8349 Family history of other endocrine, nutritional and metabolic diseases: Secondary | ICD-10-CM

## 2020-02-15 DIAGNOSIS — Z7901 Long term (current) use of anticoagulants: Secondary | ICD-10-CM | POA: Diagnosis not present

## 2020-02-15 DIAGNOSIS — Z888 Allergy status to other drugs, medicaments and biological substances status: Secondary | ICD-10-CM

## 2020-02-15 DIAGNOSIS — J439 Emphysema, unspecified: Secondary | ICD-10-CM | POA: Diagnosis present

## 2020-02-15 DIAGNOSIS — Z7189 Other specified counseling: Secondary | ICD-10-CM | POA: Diagnosis not present

## 2020-02-15 DIAGNOSIS — I480 Paroxysmal atrial fibrillation: Secondary | ICD-10-CM | POA: Diagnosis present

## 2020-02-15 DIAGNOSIS — Z8616 Personal history of COVID-19: Secondary | ICD-10-CM

## 2020-02-15 DIAGNOSIS — K50811 Crohn's disease of both small and large intestine with rectal bleeding: Secondary | ICD-10-CM | POA: Diagnosis present

## 2020-02-15 DIAGNOSIS — R519 Headache, unspecified: Secondary | ICD-10-CM | POA: Diagnosis present

## 2020-02-15 DIAGNOSIS — N2581 Secondary hyperparathyroidism of renal origin: Secondary | ICD-10-CM | POA: Diagnosis present

## 2020-02-15 DIAGNOSIS — Z79899 Other long term (current) drug therapy: Secondary | ICD-10-CM

## 2020-02-15 DIAGNOSIS — Z7952 Long term (current) use of systemic steroids: Secondary | ICD-10-CM

## 2020-02-15 DIAGNOSIS — Z86718 Personal history of other venous thrombosis and embolism: Secondary | ICD-10-CM

## 2020-02-15 DIAGNOSIS — C7931 Secondary malignant neoplasm of brain: Secondary | ICD-10-CM | POA: Diagnosis present

## 2020-02-15 DIAGNOSIS — G44011 Episodic cluster headache, intractable: Secondary | ICD-10-CM | POA: Diagnosis not present

## 2020-02-15 DIAGNOSIS — E039 Hypothyroidism, unspecified: Secondary | ICD-10-CM | POA: Diagnosis not present

## 2020-02-15 LAB — COMPREHENSIVE METABOLIC PANEL
ALT: 14 U/L (ref 0–44)
AST: 18 U/L (ref 15–41)
Albumin: 3.2 g/dL — ABNORMAL LOW (ref 3.5–5.0)
Alkaline Phosphatase: 41 U/L (ref 38–126)
Anion gap: 15 (ref 5–15)
BUN: 40 mg/dL — ABNORMAL HIGH (ref 8–23)
CO2: 18 mmol/L — ABNORMAL LOW (ref 22–32)
Calcium: 8.6 mg/dL — ABNORMAL LOW (ref 8.9–10.3)
Chloride: 103 mmol/L (ref 98–111)
Creatinine, Ser: 9.3 mg/dL — ABNORMAL HIGH (ref 0.44–1.00)
GFR calc Af Amer: 4 mL/min — ABNORMAL LOW (ref >60)
GFR calc non Af Amer: 4 mL/min — ABNORMAL LOW (ref >60)
Glucose, Bld: 75 mg/dL (ref 70–99)
Potassium: 3.6 mmol/L (ref 3.5–5.1)
Sodium: 136 mmol/L (ref 135–145)
Total Bilirubin: 0.8 mg/dL (ref 0.3–1.2)
Total Protein: 6.4 g/dL — ABNORMAL LOW (ref 6.5–8.1)

## 2020-02-15 LAB — CBC
HCT: 24.2 % — ABNORMAL LOW (ref 36.0–46.0)
Hemoglobin: 7.1 g/dL — ABNORMAL LOW (ref 12.0–15.0)
MCH: 30.5 pg (ref 26.0–34.0)
MCHC: 29.3 g/dL — ABNORMAL LOW (ref 30.0–36.0)
MCV: 103.9 fL — ABNORMAL HIGH (ref 80.0–100.0)
Platelets: 145 10*3/uL — ABNORMAL LOW (ref 150–400)
RBC: 2.33 MIL/uL — ABNORMAL LOW (ref 3.87–5.11)
RDW: 19.2 % — ABNORMAL HIGH (ref 11.5–15.5)
WBC: 6.3 10*3/uL (ref 4.0–10.5)
nRBC: 0 % (ref 0.0–0.2)

## 2020-02-15 LAB — SARS CORONAVIRUS 2 BY RT PCR (HOSPITAL ORDER, PERFORMED IN ~~LOC~~ HOSPITAL LAB): SARS Coronavirus 2: NEGATIVE

## 2020-02-15 LAB — VITAMIN B12: Vitamin B-12: 1050 pg/mL — ABNORMAL HIGH (ref 180–914)

## 2020-02-15 LAB — FERRITIN: Ferritin: 1316 ng/mL — ABNORMAL HIGH (ref 11–307)

## 2020-02-15 LAB — PHOSPHORUS: Phosphorus: 8.2 mg/dL — ABNORMAL HIGH (ref 2.5–4.6)

## 2020-02-15 LAB — POC OCCULT BLOOD, ED: Fecal Occult Bld: POSITIVE — AB

## 2020-02-15 LAB — PROTIME-INR
INR: 1.1 (ref 0.8–1.2)
Prothrombin Time: 13.7 seconds (ref 11.4–15.2)

## 2020-02-15 LAB — APTT: aPTT: 36 seconds (ref 24–36)

## 2020-02-15 LAB — IRON AND TIBC
Iron: 85 ug/dL (ref 28–170)
Saturation Ratios: 31 % (ref 10.4–31.8)
TIBC: 270 ug/dL (ref 250–450)
UIBC: 185 ug/dL

## 2020-02-15 LAB — FOLATE: Folate: 7.2 ng/mL (ref >5.9)

## 2020-02-15 LAB — MAGNESIUM: Magnesium: 1.6 mg/dL — ABNORMAL LOW (ref 1.7–2.4)

## 2020-02-15 MED ORDER — HYDRALAZINE HCL 20 MG/ML IJ SOLN
10.0000 mg | Freq: Four times a day (QID) | INTRAMUSCULAR | Status: DC | PRN
  Filled 2020-02-15: qty 1

## 2020-02-15 MED ORDER — PROCHLORPERAZINE EDISYLATE 10 MG/2ML IJ SOLN
10.0000 mg | Freq: Once | INTRAMUSCULAR | Status: AC
  Administered 2020-02-15: 10 mg via INTRAVENOUS
  Filled 2020-02-15: qty 2

## 2020-02-15 MED ORDER — LORAZEPAM 2 MG/ML IJ SOLN: 0.5000 mg | Freq: Once | INTRAMUSCULAR | Status: DC

## 2020-02-15 MED ORDER — SODIUM CHLORIDE 0.9 % IV SOLN: 100.0000 mL | INTRAVENOUS | Status: DC | PRN

## 2020-02-15 MED ORDER — ONDANSETRON HCL 4 MG PO TABS: 4.0000 mg | ORAL_TABLET | Freq: Four times a day (QID) | ORAL | Status: DC | PRN

## 2020-02-15 MED ORDER — LIDOCAINE-PRILOCAINE 2.5-2.5 % EX CREA
1.0000 "application " | TOPICAL_CREAM | CUTANEOUS | Status: DC | PRN
  Filled 2020-02-15: qty 5

## 2020-02-15 MED ORDER — OXYCODONE HCL 5 MG PO TABS
5.0000 mg | ORAL_TABLET | ORAL | Status: DC | PRN
  Administered 2020-02-15 – 2020-02-21 (×12): 5 mg via ORAL
  Filled 2020-02-15 (×11): qty 1

## 2020-02-15 MED ORDER — ONDANSETRON HCL 4 MG/2ML IJ SOLN
4.0000 mg | Freq: Four times a day (QID) | INTRAMUSCULAR | Status: DC | PRN
  Administered 2020-02-17 – 2020-02-21 (×4): 4 mg via INTRAVENOUS
  Filled 2020-02-15 (×3): qty 2

## 2020-02-15 MED ORDER — LORAZEPAM 1 MG PO TABS: 1.0000 mg | ORAL_TABLET | Freq: Once | ORAL | Status: DC

## 2020-02-15 MED ORDER — LORAZEPAM 2 MG/ML IJ SOLN
1.0000 mg | Freq: Four times a day (QID) | INTRAMUSCULAR | Status: AC | PRN
  Administered 2020-02-15: 1 mg via INTRAVENOUS
  Filled 2020-02-15: qty 1

## 2020-02-15 MED ORDER — ACETAMINOPHEN 650 MG RE SUPP: 650.0000 mg | Freq: Four times a day (QID) | RECTAL | Status: DC | PRN

## 2020-02-15 MED ORDER — ACETAMINOPHEN 325 MG PO TABS: 650.0000 mg | ORAL_TABLET | Freq: Four times a day (QID) | ORAL | Status: DC | PRN

## 2020-02-15 MED ORDER — LIDOCAINE HCL (PF) 1 % IJ SOLN: 5.0000 mL | INTRAMUSCULAR | Status: DC | PRN

## 2020-02-15 MED ORDER — MORPHINE SULFATE (PF) 4 MG/ML IV SOLN
4.0000 mg | Freq: Once | INTRAVENOUS | Status: AC
  Administered 2020-02-15: 4 mg via INTRAVENOUS
  Filled 2020-02-15: qty 1

## 2020-02-15 MED ORDER — PENTAFLUOROPROP-TETRAFLUOROETH EX AERO: 1.0000 "application " | INHALATION_SPRAY | CUTANEOUS | Status: DC | PRN

## 2020-02-15 MED ORDER — CHLORHEXIDINE GLUCONATE CLOTH 2 % EX PADS
6.0000 | MEDICATED_PAD | Freq: Every day | CUTANEOUS | Status: DC
  Administered 2020-02-16 – 2020-02-19 (×4): 6 via TOPICAL

## 2020-02-15 MED ORDER — HYDROMORPHONE HCL 1 MG/ML IJ SOLN
0.5000 mg | INTRAMUSCULAR | Status: DC | PRN
  Administered 2020-02-19 – 2020-02-21 (×5): 1 mg via INTRAVENOUS
  Filled 2020-02-15 (×5): qty 1

## 2020-02-15 MED ORDER — DILTIAZEM HCL 30 MG PO TABS
30.0000 mg | ORAL_TABLET | Freq: Three times a day (TID) | ORAL | Status: DC
  Administered 2020-02-16 – 2020-02-21 (×14): 30 mg via ORAL
  Filled 2020-02-15 (×14): qty 1

## 2020-02-15 MED ORDER — METOPROLOL TARTRATE 25 MG PO TABS
25.0000 mg | ORAL_TABLET | Freq: Two times a day (BID) | ORAL | Status: DC
  Administered 2020-02-16 – 2020-02-20 (×10): 25 mg via ORAL
  Filled 2020-02-15 (×12): qty 1

## 2020-02-15 NOTE — Consult Note (Signed)
[facility] Renal Consultation Note    Indication for Consultation:  Management of ESRD/hemodialysis; anemia, hypertension/volume and secondary hyperparathyroidism PCP: Charmaine Downs  HPI: Natasha [name] is a 74 y.o. female with ESRD on hemodialysis MWF at [facility]. PMH: HTN,Crohn's disease on Humira, chronic gastritis with H. Pylori, stage IIIa/IV (T2 a, N2, M0/M1 C) lung cancer followed by Dr. Julien Nordmann, COPD, pancreatitis, PAF on Eliquis, AOCD, SHPT. Last HD 02/13/2020 signed off early D/T not feeling well. Usually compliant with HD prescription. Recent admission 09/06-09/02/2020 for acute on chronic BLA.   Patient presented to ED this AM with C/O of headache per patient report since 02/08/2020. Upon arrival to ED, HGB was noted to be 7.1 FOBT positive. Area of subcortical low-density in the right parietal lobe. Cannot exclude focal lesion/metastasis. MRI pending. Na 136 K+ 3.6 SCr 9.3 BUN 40 AST 18 ALT 14. Labs other than HGB otherwise unremarkable. She has been admitted for work up for severe headache, concern for possible brain mets. Oncology has been consulted.    Seen in ED, appears comfortable. Still C/Os HA, but says otherwise she feels OK. She denies nausea, vomiting, dark or tarry stools, SOB, chest pain, fever, chills.   Past Medical History:  Diagnosis Date  . Anal fistula   . Anemia in chronic kidney disease (CKD)   . Aortic insufficiency    Echo 3/18: mod conc LVH, EF 60-65, no RWMA, Gr 1 DD, mod AI, mild MR, normal RVSF, Trivial TR  . Arthritis   . Borderline hypertension   . Bulging disc    L3-L4  . Chronic diarrhea    due to crohn's  . CKD (chronic kidney disease), stage IV ([place])    MWF- [address]  . Crohn's disease ([place])    chronic ileitis  . Dyspnea    with exertion  . Emphysema/COPD ([place])   . GERD (gastroesophageal reflux disease)    denies  . History of blood transfusion   . History of glaucoma   . History of kidney  stones   . History of pancreatitis    2008--  mercaptopurine  . History of small bowel obstruction    12-03-2010  due to crohn's ileitis  . Hypertension    no longer on medications  . Hypothyroidism, postsurgical    multinodule w/ hurthle cells  . Osteoporosis   . PAF (paroxysmal atrial fibrillation) ([place])   . Perianal Crohn's disease ([place])   . Peripheral vascular disease ([place])    blood clot behind knee left leg  . Polyarthralgia   . Seizures ([place])    03/2017  . Wears partial dentures    upper   Past Surgical History:  Procedure Laterality Date  . ABDOMINAL HYSTERECTOMY  1990   and  Appendectomy  . AV FISTULA PLACEMENT Left 12/08/2017   Procedure: INSERTION OF ARTERIOVENOUS (AV) GRAFT WITH ARTEGRAFT TO LEFT UPPER ARM;  Surgeon: Conrad Grimsley, MD;  Location: [place];  Service: Vascular;  Laterality: Left;  . BASCILIC VEIN TRANSPOSITION Left 01/09/2017   Procedure: LEFT 1ST STAGE BRACHIAL VEIN TRANSPOSITION;  Surgeon: Conrad Loyal, MD;  Location: [place];  Service: Vascular;  Laterality: Left;  . BASCILIC VEIN TRANSPOSITION Left 06/16/2017   Procedure: Second Stage BASILIC VEIN TRANSPOSITION  LEFT ARM;  Surgeon: Conrad Brawley, MD;  Location: [place];  Service: Vascular;  Laterality: Left;  . BIOPSY  03/09/2018   Procedure: BIOPSY;  Surgeon: Wilford Corner, MD;  Location: [facility];  Service: Endoscopy;;  .  BIOPSY  01/31/2020   Procedure: BIOPSY;  Surgeon: Otis Brace, MD;  Location: [place];  Service: Gastroenterology;;  . BIOPSY  02/02/2020   Procedure: BIOPSY;  Surgeon: Otis Brace, MD;  Location: [facility];  Service: Gastroenterology;;  . CHOLECYSTECTOMY    . COLON RESECTION  x3 --  1978,  1987, 1989   ILEAL RESECTION x2/   [place]  . COLONOSCOPY WITH PROPOFOL N/A 09/25/2014   Procedure: COLONOSCOPY WITH PROPOFOL;  Surgeon: Garlan Fair, MD;  [facility]: [facility];  Service: Endoscopy;  Laterality: N/A;  . COLONOSCOPY WITH PROPOFOL N/A  03/09/2018   Procedure: COLONOSCOPY WITH PROPOFOL;  Surgeon: Wilford Corner, MD;  [facility];  Service: Endoscopy;  Laterality: N/A;  . COLONOSCOPY WITH PROPOFOL N/A 02/02/2020   Procedure: COLONOSCOPY WITH PROPOFOL;  Surgeon: Otis Brace, MD;  Location: [place];  Service: Gastroenterology;  Laterality: N/A;  . CYSTOSCOPY W/ URETERAL STENT PLACEMENT Right 11/19/2016   Procedure: CYSTOSCOPY WITH RIGHT RETROGRADE PYELOGRAM/ URETEROSCOPY WITH LASER AND RIGHT URETERAL STENT PLACEMENT;  Surgeon: Ardis Hughs, MD;  Location: [facility];  Service: Urology;  Laterality: Right;  . ESOPHAGOGASTRODUODENOSCOPY  03/04/2012   Procedure: ESOPHAGOGASTRODUODENOSCOPY (EGD);  Surgeon: Garlan Fair, MD;  Location[facility];  Service: Endoscopy;  Laterality: N/A;  . ESOPHAGOGASTRODUODENOSCOPY (EGD) WITH PROPOFOL N/A 01/31/2020   Procedure: ESOPHAGOGASTRODUODENOSCOPY (EGD) WITH PROPOFOL;  Surgeon: Otis Brace, MD;  Location: [facility];  Service: Gastroenterology;  Laterality: N/A;  . EXAMINATION UNDER ANESTHESIA N/A 09/12/2014   Procedure: EXAM UNDER ANESTHESIA;  Surgeon: Rolm Bookbinder, MD;  Location: [place];  Service: General;  Laterality: N/A;  . EYE SURGERY     laser  . FLEXIBLE SIGMOIDOSCOPY  03/04/2012   Procedure: FLEXIBLE SIGMOIDOSCOPY;  Surgeon: Garlan Fair, MD;  [facility];  Service: Endoscopy;  Laterality: N/A;  . GLAUCOMA SURGERY Bilateral   . HOLMIUM LASER APPLICATION Right 9/79/8921   Procedure: HOLMIUM LASER APPLICATION;  Surgeon: Ardis Hughs, MD;  Location: [facility];  Service: Urology;  Laterality: Right;  . INCISION AND DRAINAGE PERIRECTAL ABSCESS N/A 09/12/2014   Procedure: IRRIGATION AND DEBRIDEMENT PERIRECTAL ABSCESS;  Surgeon: Rolm Bookbinder, MD;  Location: [place];  Service: General;  Laterality: N/A;  . IR FLUORO GUIDE CV LINE RIGHT  03/20/2017  . IR US GUIDE VASC ACCESS RIGHT  03/20/2017  . NEGATIVE SLEEP STUDY  2008  .  PLACEMENT OF SETON N/A 12/01/2014   Procedure: PLACEMENT OF SETON;  Surgeon: [name], MD;  Location: [facility];  Service: General;  Laterality: N/A;  . POLYPECTOMY  03/09/2018   Procedure: POLYPECTOMY;  Surgeon: Wilford Corner, MD;  [facility];  Service: Endoscopy;;  . POLYPECTOMY  02/02/2020   Procedure: POLYPECTOMY;  Surgeon: Otis Brace, MD;  Location: [facility];  Service: Gastroenterology;;  . RECTAL EXAM UNDER ANESTHESIA N/A 12/01/2014   Procedure: RECTAL EXAM UNDER ANESTHESIA;  Surgeon: [name], MD;  Location: [facility];  Service: General;  Laterality: N/A;  . TOTAL THYROIDECTOMY  10-13-2003  . TRANSTHORACIC ECHOCARDIOGRAM  07-11-2013   mild LVH,  ef 55%,  moderate AR,  mild MR and TR,  trivial PR  . TUBAL LIGATION     Family History  Problem Relation Age of Onset  . Hypertension Father   . Heart disease Father   . Heart attack Father   . Diverticulitis Mother   . COPD Mother   . Hypertension Mother   . Heart disease Mother   . Heart  attack Mother   . Heart attack Brother   . Colon cancer Brother   . Diabetes Brother   . Thyroid disease Brother    Social History:  reports that she quit smoking about 2 years ago. Her smoking use included cigarettes. She started smoking about 53 years ago. She has a 35.00 pack-year smoking history. She has never used smokeless tobacco. She reports that she does not drink alcohol and does not use drugs. Allergies  Allergen Reactions  . Mercaptopurine Other (See Comments)    Caused pancreatitis  . Remicade [Infliximab] Other (See Comments)    CAUSED JOINT PAIN   Prior to Admission medications   Medication Sig Start Date End Date Taking? Authorizing Provider  acetaminophen (TYLENOL) 500 MG tablet Take 1,000 mg by mouth every 6 (six) hours as needed for mild pain.     [provider]  Adalimumab (HUMIRA) 40 MG/0.8ML PSKT Inject 40 mg into the skin every 14 (fourteen)  days.     [provider]  albuterol (VENTOLIN HFA) 108 (90 Base) MCG/ACT inhaler INHALE 2 PUFFS BY MOUTH EVERY 6 HOURS IF NEEDED FOR WHEEZING OR SHORTNESS OF BREATH 05/31/19   Chesley Mires, MD  calcitRIOL (ROCALTROL) 0.25 MCG capsule Take 1 capsule (0.25 mcg total) by mouth every Monday, Wednesday, and Friday with hemodialysis. 02/03/20   Regalado, Belkys A, MD  calcium acetate (PHOSLO) 667 MG capsule Take 1 capsule (667 mg total) by mouth 3 (three) times daily with meals. 02/03/20   Regalado, Belkys A, MD  calcium-vitamin D (OSCAL WITH D) 500-200 MG-UNIT TABS tablet Take 1 tablet by mouth as needed (with dialysis). 02/03/20   Regalado, Belkys A, MD  cyanocobalamin (,VITAMIN B-12,) 1000 MCG/ML injection Inject 1,000 mcg into the muscle every 30 (thirty) days.     [provider]  diltiazem (CARDIZEM) 30 MG tablet Take 1 tablet (30 mg total) by mouth every 8 (eight) hours. 01/24/20   Nahser, Wonda Cheng, MD  ELIQUIS 5 MG TABS tablet Take 2.5 mg by mouth 2 (two) times daily. Takes 1/2 tablet (2.76m) bid 08/23/18   [provider]  fluticasone (FLONASE) 50 MCG/ACT nasal spray Place 1 spray into both nostrils daily as needed for allergies.  03/19/19   [provider]  Fluticasone-Umeclidin-Vilant (TRELEGY ELLIPTA) 100-62.5-25 MCG/INH AEPB Inhale 1 puff into the lungs daily. 01/17/20   WMartyn Ehrich NP  ipratropium-albuterol (DUONEB) 0.5-2.5 (3) MG/3ML SOLN Take 3 mLs by nebulization every 6 (six) hours as needed. Patient taking differently: Take 3 mLs by nebulization every 6 (six) hours as needed (sob/wheezing).  12/30/18   NFenton Foy NP  levofloxacin (LEVAQUIN) 250 MG tablet Take 1 tablet (250 mg total) by mouth every other day for 10 days. 02/05/20 02/15/20  Regalado, BJerald KiefA, MD  levothyroxine (SYNTHROID, LEVOTHROID) 125 MCG tablet Take 125 mcg by mouth daily before breakfast.     [provider]  lidocaine-prilocaine (EMLA) cream Apply 1 application  topically every Monday, Wednesday, and Friday.  11/29/18   [provider]  loperamide (IMODIUM) 2 MG capsule Take 2 capsules (4 mg total) by mouth as needed for diarrhea or loose stools. 03/27/17   GCaren Griffins MD  metoprolol tartrate (LOPRESSOR) 25 MG tablet Take 25 mg by mouth 2 (two) times daily. 02/10/20   [provider]  midodrine (PROAMATINE) 10 MG tablet Take 1 tablet (10 mg total) by mouth 3 (three) times daily with meals. 01/24/20   Nahser, PWonda Cheng MD  nitroGLYCERIN (NITROSTAT) 0.4  MG SL tablet Place 1 tablet (0.4 mg total) under the tongue every 5 (five) minutes as needed for chest pain. 05/03/19 12/27/19  Nahser, Wonda Cheng, MD  ondansetron (ZOFRAN ODT) 4 MG disintegrating tablet Take 1 tablet (4 mg total) by mouth every 8 (eight) hours as needed for nausea or vomiting. 02/03/20   Regalado, Belkys A, MD  OXYGEN Inhale 2-3 L into the lungs See admin instructions. Use every night and during the day as needed for shortness of breath    [provider]  pantoprazole (PROTONIX) 40 MG tablet Take 1 tablet (40 mg total) by mouth 2 (two) times daily. 02/03/20 03/04/20  Regalado, Belkys A, MD  predniSONE (DELTASONE) 5 MG tablet Take 5 mg by mouth daily with breakfast.    [provider]  Respiratory Therapy Supplies (NEBULIZER) DEVI 1 Device by Does not apply route as needed. 12/07/18   Fenton Foy, NP   Current Facility-Administered Medications  Medication Dose Route Frequency Provider Last Rate Last Admin  . acetaminophen (TYLENOL) tablet 650 mg  650 mg Oral Q6H PRN Pahwani, Rinka R, MD       Or  . acetaminophen (TYLENOL) suppository 650 mg  650 mg Rectal Q6H PRN Pahwani, Rinka R, MD      . hydrALAZINE (APRESOLINE) injection 10 mg  10 mg Intravenous Q6H PRN Pahwani, Rinka R, MD      . HYDROmorphone (DILAUDID) injection 0.5-1 mg  0.5-1 mg Intravenous Q2H PRN Pahwani, Rinka R, MD      . LORazepam (ATIVAN) injection 0.5 mg  0.5 mg Intravenous Once Patel,  Shalyn, PA-C      . ondansetron (ZOFRAN) tablet 4 mg  4 mg Oral Q6H PRN Pahwani, Rinka R, MD       Or  . ondansetron (ZOFRAN) injection 4 mg  4 mg Intravenous Q6H PRN Pahwani, Rinka R, MD      . oxyCODONE (Oxy IR/ROXICODONE) immediate release tablet 5 mg  5 mg Oral Q4H PRN Pahwani, Rinka R, MD       Current Outpatient Medications  Medication Sig Dispense Refill  . acetaminophen (TYLENOL) 500 MG tablet Take 1,000 mg by mouth every 6 (six) hours as needed for mild pain.     . Adalimumab (HUMIRA) 40 MG/0.8ML PSKT Inject 40 mg into the skin every 14 (fourteen) days.     [name] albuterol (VENTOLIN HFA) 108 (90 Base) MCG/ACT inhaler INHALE 2 PUFFS BY MOUTH EVERY 6 HOURS IF NEEDED FOR WHEEZING OR SHORTNESS OF BREATH 8.5 g 12  . calcitRIOL (ROCALTROL) 0.25 MCG capsule Take 1 capsule (0.25 mcg total) by mouth every Monday, Wednesday, and Friday with hemodialysis. 30 capsule 1  . calcium acetate (PHOSLO) 667 MG capsule Take 1 capsule (667 mg total) by mouth 3 (three) times daily with meals. 90 capsule 0  . calcium-vitamin D (OSCAL WITH D) 500-200 MG-UNIT TABS tablet Take 1 tablet by mouth as needed (with dialysis). 30 tablet 1  . cyanocobalamin (,VITAMIN B-12,) 1000 MCG/ML injection Inject 1,000 mcg into the muscle every 30 (thirty) days.     [name] diltiazem (CARDIZEM) 30 MG tablet Take 1 tablet (30 mg total) by mouth every 8 (eight) hours. 90 tablet 11  . ELIQUIS 5 MG TABS tablet Take 2.5 mg by mouth 2 (two) times daily. Takes 1/2 tablet (2.49m) bid    . fluticasone (FLONASE) 50 MCG/ACT nasal spray Place 1 spray into both nostrils daily as needed for allergies.     . Fluticasone-Umeclidin-Vilant (TRELEGY ELLIPTA) 100-62.5-25 MCG/INH  AEPB Inhale 1 puff into the lungs daily. 60 each 11  . ipratropium-albuterol (DUONEB) 0.5-2.5 (3) MG/3ML SOLN Take 3 mLs by nebulization every 6 (six) hours as needed. (Patient taking differently: Take 3 mLs by nebulization every 6 (six) hours as needed (sob/wheezing). ) 360 mL 3  .  levofloxacin (LEVAQUIN) 250 MG tablet Take 1 tablet (250 mg total) by mouth every other day for 10 days. 5 tablet 0  . levothyroxine (SYNTHROID, LEVOTHROID) 125 MCG tablet Take 125 mcg by mouth daily before breakfast.     . lidocaine-prilocaine (EMLA) cream Apply 1 application topically every Monday, Wednesday, and Friday.     . loperamide (IMODIUM) 2 MG capsule Take 2 capsules (4 mg total) by mouth as needed for diarrhea or loose stools. 30 capsule 0  . metoprolol tartrate (LOPRESSOR) 25 MG tablet Take 25 mg by mouth 2 (two) times daily.    . midodrine (PROAMATINE) 10 MG tablet Take 1 tablet (10 mg total) by mouth 3 (three) times daily with meals. 90 tablet 11  . nitroGLYCERIN (NITROSTAT) 0.4 MG SL tablet Place 1 tablet (0.4 mg total) under the tongue every 5 (five) minutes as needed for chest pain. 25 tablet 3  . ondansetron (ZOFRAN ODT) 4 MG disintegrating tablet Take 1 tablet (4 mg total) by mouth every 8 (eight) hours as needed for nausea or vomiting. 20 tablet 0  . OXYGEN Inhale 2-3 L into the lungs See admin instructions. Use every night and during the day as needed for shortness of breath    . pantoprazole (PROTONIX) 40 MG tablet Take 1 tablet (40 mg total) by mouth 2 (two) times daily. 30 tablet 1  . predniSONE (DELTASONE) 5 MG tablet Take 5 mg by mouth daily with breakfast.    . Respiratory Therapy Supplies (NEBULIZER) DEVI 1 Device by Does not apply route as needed. 1 Device 0   Labs: Basic Metabolic Panel: Recent Labs  Lab 02/15/20 1110  NA 136  K 3.6  CL 103  CO2 18*  GLUCOSE 75  BUN 40*  CREATININE 9.30*  CALCIUM 8.6*   Liver Function Tests: Recent Labs  Lab 02/15/20 1110  AST 18  ALT 14  ALKPHOS 41  BILITOT 0.8  PROT 6.4*  ALBUMIN 3.2*   No results for input(s): LIPASE, AMYLASE in the last 168 hours. No results for input(s): AMMONIA in the last 168 hours. CBC: Recent Labs  Lab 02/15/20 1110  WBC 6.3  HGB 7.1*  HCT 24.2*  MCV 103.9*  PLT 145*   Cardiac  Enzymes: No results for input(s): CKTOTAL, CKMB, CKMBINDEX, TROPONINI in the last 168 hours. CBG: No results for input(s): GLUCAP in the last 168 hours. Iron Studies: No results for input(s): IRON, TIBC, TRANSFERRIN, FERRITIN in the last 72 hours. Studies/Results: CT Head Wo Contrast  Result Date: 02/15/2020 CLINICAL DATA:  Headache.  Lung cancer. EXAM: CT HEAD WITHOUT CONTRAST TECHNIQUE: Contiguous axial images were obtained from the base of the skull through the vertex without intravenous contrast. COMPARISON:  MRI 12/01/2019 FINDINGS: Brain: There is an area of low-density noted in the right parietal subcortical white matter, new since prior MRI. Given history of lung cancer, cannot exclude metastasis in this region. No hemorrhage or hydrocephalus. Vascular: No hyperdense vessel or unexpected calcification. Skull: No acute calvarial abnormality. Sinuses/Orbits: Visualized paranasal sinuses and mastoids clear. Orbital soft tissues unremarkable. Other: None IMPRESSION: Area of subcortical low-density in the right parietal lobe. Cannot exclude focal lesion/metastasis. Consider further evaluation with MRI with and  without contrast. Electronically Signed   By: Rolm Baptise M.D.   On: 02/15/2020 11:42    ROS: As per HPI otherwise negative.   Physical Exam: Vitals:   02/15/20 0649 02/15/20 0902  BP: (!) 159/70 (!) 143/67  Pulse: 98 95  Resp: 16 15  Temp: 98.4 F (36.9 C)   TempSrc: Oral   SpO2: 100% 100%     General: Thin, elderly female no acute distress. Head: Normocephalic, atraumatic, sclera non-icteric, mucus membranes are moist Neck: Supple. JVD not elevated. Lungs: Clear bilaterally to auscultation without wheezes, rales, or rhonchi. Breathing is unlabored. Heart: RRR with S1 S2. No murmurs, rubs, or gallops appreciated. Abdomen: Soft, somewhat protuberant,  non-tender, non-distended with normoactive bowel sounds. No rebound/guarding. No obvious abdominal masses. M-S:  Strength and  tone appear normal for age. Lower extremities: 1+ pitting edema posterior calves, ankles Neuro: Alert and oriented X 3. Moves all extremities spontaneously. Psych:  Responds to questions appropriately with a normal affect. Dialysis Access: L AVG + bruit  Dialysis Orders: [place] MWF 4 hrs 180NRe 300/800 49.5 kg 3.0 K/ 2.25 Ca UFP 3 -No Heparin  -Hectorol 3 mcg IV TIW -No ESA D/T Lung mass/possible malignancy.    Assessment/Plan: 1.  Severe Headache-Work up per primary. Oncology consulted.  2.  Acute on Chronic Anemia-denies overt blood loss but FOBT +. HGB 7.1 today. Last HGB at OP clinic 8.5 02/08/2020. Transfuse 1 unit PRBCs today in HD.  3.  ESRD -  MWF via AVG. HD today on schedule. Use 4.0 K bath, no heparin.  4.  Hypertension/volume  - Has not been getting to OP EDW, dropping BP toward end of treatment. BP controlled at present. BLE edema. Attempt UFG 2-3 liters as tolerated. Continue Midodrine 10 mg PO TIW.  5.  Anemia  - As noted above. H/O Crohn's disease  6.  Metabolic bone disease -C Ca OK. Continue VDRA, binders.  7.  Nutrition -Renal diet with fld restrictions. Albumin low, add protein supps, renal vits.  8.  COPD- per primary 9.  Chron's disease- per primary  Thuan Tippett H. Owens Shark, NP-C 02/15/2020, 2:26 PM  [employer] [phone]

## 2020-02-15 NOTE — Consult Note (Signed)
Referring Provider:  [facility] Primary Care Physician:  [name], MD Primary Gastroenterologist:  Dr. Michail Sermon  Reason for Consultation: Heme positive stool and anemia  HPI: Natasha [name] is a 74 y.o. female known well to my partner, Dr. Cannon Kettle, because of a longstanding history of Crohn's ileocolitis, status post ileal resection, maintained on Humira.  Medical issues include end-stage renal disease on dialysis, and chronic home oxygen.  With that background, the patient was admitted for melena and anemia approximately 2 weeks ago, and underwent endoscopy and colonoscopy by Dr. Alessandra Bevels.  The endoscopy was essentially negative, the colonoscopy showed anastomotic ulceration, some focal sigmoid ulceration, and a small benign sigmoid polyp.  It appears the patient was transfused 1 unit of packed red cells during that hospitalization, and she had a hemoglobin of 8.5 at the time of discharge on September 10.    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  However, there has been no overt GI bleeding, and she is without any evident localizing GI tract complaints or exposure to ulcerogenic medications although she is on Eliquis.   Past Medical History:  Diagnosis Date  . Anal fistula   . Anemia in chronic kidney disease (CKD)   . Aortic insufficiency    Echo 3/18: mod conc LVH, EF 60-65, no RWMA, Gr 1 DD, mod AI, mild MR, normal RVSF, Trivial TR  . Arthritis   . Borderline hypertension   . Bulging disc    L3-L4  . Chronic diarrhea    due to crohn's  . CKD (chronic kidney disease), stage IV ([place])    MWF- [address]  . Crohn's disease ([place])    chronic ileitis  . Dyspnea    with exertion  . 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 GERD (gastroesophageal reflux disease)    denies  . History of blood transfusion   . History of glaucoma   .  History of kidney stones   . History of pancreatitis    2008--  mercaptopurine  . History of small bowel obstruction    12-03-2010  due to crohn's ileitis  . Hypertension    no longer on medications  . Hypothyroidism, postsurgical    multinodule w/ hurthle cells  . Osteoporosis   . PAF (paroxysmal atrial fibrillation) ([place])   . Perianal Crohn's disease ([place])   . Peripheral vascular disease ([place])    blood clot behind knee left leg  . Polyarthralgia   . Seizures ([place])    03/2017  . Wears partial dentures    upper    Past Surgical History:  Procedure Laterality Date  . ABDOMINAL HYSTERECTOMY  1990   and  Appendectomy  . AV FISTULA PLACEMENT Left 12/08/2017   Procedure: INSERTION OF ARTERIOVENOUS (AV) GRAFT WITH ARTEGRAFT TO LEFT UPPER ARM;  Surgeon: Conrad McCaskill, MD;  Location: [facility];  Service: Vascular;  Laterality: Left;  . BASCILIC VEIN TRANSPOSITION Left 01/09/2017   Procedure: LEFT 1ST STAGE BRACHIAL VEIN TRANSPOSITION;  Surgeon: Conrad Science Hill, MD;  Location: [place];  Service: Vascular;  Laterality: Left;  . BASCILIC VEIN TRANSPOSITION Left 06/16/2017   Procedure: Second Stage BASILIC VEIN TRANSPOSITION  LEFT ARM;  Surgeon: Conrad , MD;  Location: [place] [facility];  Service: Vascular;  Laterality: Left;  . BIOPSY  03/09/2018   Procedure: BIOPSY;  Surgeon: Wilford Corner, MD;  Location: [facility];  Service: Endoscopy;;  .  BIOPSY  01/31/2020   Procedure: BIOPSY;  Surgeon: Otis Brace, MD;  Location: [facility];  Service: Gastroenterology;;  . BIOPSY  02/02/2020   Procedure: BIOPSY;  Surgeon: Otis Brace, MD;  Location: [place];  Service: Gastroenterology;;  . CHOLECYSTECTOMY    . COLON RESECTION  x3 --  1978,  1987, 1989   ILEAL RESECTION x2/   [place]  . COLONOSCOPY WITH PROPOFOL N/A 09/25/2014   Procedure: COLONOSCOPY WITH PROPOFOL;  Surgeon: Garlan Fair, MD;  [facility];  Service: Endoscopy;  Laterality: N/A;  . COLONOSCOPY WITH  PROPOFOL N/A 03/09/2018   Procedure: COLONOSCOPY WITH PROPOFOL;  Surgeon: Wilford Corner, MD;  [facility];  Service: Endoscopy;  Laterality: N/A;  . COLONOSCOPY WITH PROPOFOL N/A 02/02/2020   Procedure: COLONOSCOPY WITH PROPOFOL;  Surgeon: Otis Brace, MD;  Location: [facility];  Service: Gastroenterology;  Laterality: N/A;  . CYSTOSCOPY W/ URETERAL STENT PLACEMENT Right 11/19/2016   Procedure: CYSTOSCOPY WITH RIGHT RETROGRADE PYELOGRAM/ URETEROSCOPY WITH LASER AND RIGHT URETERAL STENT PLACEMENT;  Surgeon: Ardis Hughs, MD;  [facility];  Service: Urology;  Laterality: Right;  . ESOPHAGOGASTRODUODENOSCOPY  03/04/2012   Procedure: ESOPHAGOGASTRODUODENOSCOPY (EGD);  Surgeon: Garlan Fair, MD;  Location: [facility];  Service: Endoscopy;  Laterality: N/A;  . ESOPHAGOGASTRODUODENOSCOPY (EGD) WITH PROPOFOL N/A 01/31/2020   Procedure: ESOPHAGOGASTRODUODENOSCOPY (EGD) WITH PROPOFOL;  Surgeon: Otis Brace, MD;  [facility];  Service: Gastroenterology;  Laterality: N/A;  . EXAMINATION UNDER ANESTHESIA N/A 09/12/2014   Procedure: EXAM UNDER ANESTHESIA;  Surgeon: Rolm Bookbinder, MD;  Location: [facility];  Service: General;  Laterality: N/A;  . EYE SURGERY     laser  . FLEXIBLE SIGMOIDOSCOPY  03/04/2012   Procedure: FLEXIBLE SIGMOIDOSCOPY;  Surgeon: Garlan Fair, MD;  [facility];  Service: Endoscopy;  Laterality: N/A;  . GLAUCOMA SURGERY Bilateral   . HOLMIUM LASER APPLICATION Right 5/91/6384   Procedure: HOLMIUM LASER APPLICATION;  Surgeon: Ardis Hughs, MD;  [facility];  Service: Urology;  Laterality: Right;  . INCISION AND DRAINAGE PERIRECTAL ABSCESS N/A 09/12/2014   Procedure: IRRIGATION AND DEBRIDEMENT PERIRECTAL ABSCESS;  Surgeon: Rolm Bookbinder, MD;  Location: [facility];  Service: General;  Laterality: N/A;  . IR FLUORO GUIDE CV LINE RIGHT  03/20/2017  . IR US GUIDE VASC ACCESS RIGHT  03/20/2017  . NEGATIVE SLEEP STUDY   2008  . PLACEMENT OF SETON N/A 12/01/2014   Procedure: PLACEMENT OF SETON;  Surgeon: [name], MD;  [facility];  Service: General;  Laterality: N/A;  . POLYPECTOMY  03/09/2018   Procedure: POLYPECTOMY;  Surgeon: Wilford Corner, MD;  [facility];  Service: Endoscopy;;  . POLYPECTOMY  02/02/2020   Procedure: POLYPECTOMY;  Surgeon: Otis Brace, MD;  Location: [facility];  Service: Gastroenterology;;  . RECTAL EXAM UNDER ANESTHESIA N/A 12/01/2014   Procedure: RECTAL EXAM UNDER ANESTHESIA;  Surgeon: [name], MD;  Location: [facility];  Service: General;  Laterality: N/A;  . TOTAL THYROIDECTOMY  10-13-2003  . TRANSTHORACIC ECHOCARDIOGRAM  07-11-2013   mild LVH,  ef 55%,  moderate AR,  mild MR and TR,  trivial PR  . TUBAL LIGATION      Prior to Admission medications   Medication Sig Start Date End Date Taking? Authorizing Provider  acetaminophen (TYLENOL) 500 MG tablet Take 1,000 mg by mouth every 6 (six) hours as needed for mild pain.     [provider]  Adalimumab (HUMIRA) 40 MG/0.8ML PSKT  Inject 40 mg into the skin every 14 (fourteen) days.     [provider]  albuterol (VENTOLIN HFA) 108 (90 Base) MCG/ACT inhaler INHALE 2 PUFFS BY MOUTH EVERY 6 HOURS IF NEEDED FOR WHEEZING OR SHORTNESS OF BREATH 05/31/19   Chesley Mires, MD  calcitRIOL (ROCALTROL) 0.25 MCG capsule Take 1 capsule (0.25 mcg total) by mouth every Monday, Wednesday, and Friday with hemodialysis. 02/03/20   Regalado, Belkys A, MD  calcium acetate (PHOSLO) 667 MG capsule Take 1 capsule (667 mg total) by mouth 3 (three) times daily with meals. 02/03/20   Regalado, Belkys A, MD  calcium-vitamin D (OSCAL WITH D) 500-200 MG-UNIT TABS tablet Take 1 tablet by mouth as needed (with dialysis). 02/03/20   Regalado, Belkys A, MD  cyanocobalamin (,VITAMIN B-12,) 1000 MCG/ML injection Inject 1,000 mcg into the muscle every 30 (thirty) days.     [provider]  diltiazem (CARDIZEM) 30 MG tablet Take 1 tablet (30 mg total) by mouth every 8 (eight) hours. 01/24/20   Nahser, Wonda Cheng, MD  ELIQUIS 5 MG TABS tablet Take 2.5 mg by mouth 2 (two) times daily. Takes 1/2 tablet (2.38m) bid 08/23/18   [provider]  fluticasone (FLONASE) 50 MCG/ACT nasal spray Place 1 spray into both nostrils daily as needed for allergies.  03/19/19   [provider]  Fluticasone-Umeclidin-Vilant (TRELEGY ELLIPTA) 100-62.5-25 MCG/INH AEPB Inhale 1 puff into the lungs daily. 01/17/20   WMartyn Ehrich NP  ipratropium-albuterol (DUONEB) 0.5-2.5 (3) MG/3ML SOLN Take 3 mLs by nebulization every 6 (six) hours as needed. Patient taking differently: Take 3 mLs by nebulization every 6 (six) hours as needed (sob/wheezing).  12/30/18   NFenton Foy NP  levofloxacin (LEVAQUIN) 250 MG tablet Take 1 tablet (250 mg total) by mouth every other day for 10 days. 02/05/20 02/15/20  Regalado, BJerald KiefA, MD  levothyroxine (SYNTHROID, LEVOTHROID) 125 MCG tablet Take 125 mcg by mouth daily before breakfast.     [provider]  lidocaine-prilocaine (EMLA) cream Apply 1 application topically every Monday, Wednesday, and Friday.  11/29/18   [provider]  loperamide (IMODIUM) 2 MG capsule Take 2 capsules (4 mg total) by mouth as needed for diarrhea or loose stools. 03/27/17   GCaren Griffins MD  midodrine (PROAMATINE) 10 MG tablet Take 1 tablet (10 mg total) by mouth 3 (three) times daily with meals. 01/24/20   Nahser, PWonda Cheng MD  nitroGLYCERIN (NITROSTAT) 0.4 MG SL tablet Place 1 tablet (0.4 mg total) under the tongue every 5 (five) minutes as needed for chest pain. 05/03/19 12/27/19  Nahser, PWonda Cheng MD  ondansetron (ZOFRAN ODT) 4 MG disintegrating tablet Take 1 tablet (4 mg total) by mouth every 8 (eight) hours as needed for nausea or vomiting. 02/03/20   Regalado, Belkys A, MD  OXYGEN Inhale 2-3 L into the lungs See admin instructions. Use every night and during  the day as needed for shortness of breath    [provider]  pantoprazole (PROTONIX) 40 MG tablet Take 1 tablet (40 mg total) by mouth 2 (two) times daily. 02/03/20 03/04/20  Regalado, Belkys A, MD  predniSONE (DELTASONE) 5 MG tablet Take 5 mg by mouth daily with breakfast.    [provider]  Respiratory Therapy Supplies (NEBULIZER) DEVI 1 Device by Does not apply route as needed. 12/07/18   NFenton Foy NP    No current facility-administered medications for this encounter.   Current Outpatient Medications  Medication Sig Dispense Refill  .  acetaminophen (TYLENOL) 500 MG tablet Take 1,000 mg by mouth every 6 (six) hours as needed for mild pain.     . Adalimumab (HUMIRA) 40 MG/0.8ML PSKT Inject 40 mg into the skin every 14 (fourteen) days.     [name] albuterol (VENTOLIN HFA) 108 (90 Base) MCG/ACT inhaler INHALE 2 PUFFS BY MOUTH EVERY 6 HOURS IF NEEDED FOR WHEEZING OR SHORTNESS OF BREATH 8.5 g 12  . calcitRIOL (ROCALTROL) 0.25 MCG capsule Take 1 capsule (0.25 mcg total) by mouth every Monday, Wednesday, and Friday with hemodialysis. 30 capsule 1  . calcium acetate (PHOSLO) 667 MG capsule Take 1 capsule (667 mg total) by mouth 3 (three) times daily with meals. 90 capsule 0  . calcium-vitamin D (OSCAL WITH D) 500-200 MG-UNIT TABS tablet Take 1 tablet by mouth as needed (with dialysis). 30 tablet 1  . cyanocobalamin (,VITAMIN B-12,) 1000 MCG/ML injection Inject 1,000 mcg into the muscle every 30 (thirty) days.     [name] diltiazem (CARDIZEM) 30 MG tablet Take 1 tablet (30 mg total) by mouth every 8 (eight) hours. 90 tablet 11  . ELIQUIS 5 MG TABS tablet Take 2.5 mg by mouth 2 (two) times daily. Takes 1/2 tablet (2.70m) bid    . fluticasone (FLONASE) 50 MCG/ACT nasal spray Place 1 spray into both nostrils daily as needed for allergies.     . Fluticasone-Umeclidin-Vilant (TRELEGY ELLIPTA) 100-62.5-25 MCG/INH AEPB Inhale 1 puff into the lungs daily. 60 each 11  . ipratropium-albuterol  (DUONEB) 0.5-2.5 (3) MG/3ML SOLN Take 3 mLs by nebulization every 6 (six) hours as needed. (Patient taking differently: Take 3 mLs by nebulization every 6 (six) hours as needed (sob/wheezing). ) 360 mL 3  . levofloxacin (LEVAQUIN) 250 MG tablet Take 1 tablet (250 mg total) by mouth every other day for 10 days. 5 tablet 0  . levothyroxine (SYNTHROID, LEVOTHROID) 125 MCG tablet Take 125 mcg by mouth daily before breakfast.     . lidocaine-prilocaine (EMLA) cream Apply 1 application topically every Monday, Wednesday, and Friday.     . loperamide (IMODIUM) 2 MG capsule Take 2 capsules (4 mg total) by mouth as needed for diarrhea or loose stools. 30 capsule 0  . midodrine (PROAMATINE) 10 MG tablet Take 1 tablet (10 mg total) by mouth 3 (three) times daily with meals. 90 tablet 11  . nitroGLYCERIN (NITROSTAT) 0.4 MG SL tablet Place 1 tablet (0.4 mg total) under the tongue every 5 (five) minutes as needed for chest pain. 25 tablet 3  . ondansetron (ZOFRAN ODT) 4 MG disintegrating tablet Take 1 tablet (4 mg total) by mouth every 8 (eight) hours as needed for nausea or vomiting. 20 tablet 0  . OXYGEN Inhale 2-3 L into the lungs See admin instructions. Use every night and during the day as needed for shortness of breath    . pantoprazole (PROTONIX) 40 MG tablet Take 1 tablet (40 mg total) by mouth 2 (two) times daily. 30 tablet 1  . predniSONE (DELTASONE) 5 MG tablet Take 5 mg by mouth daily with breakfast.    . Respiratory Therapy Supplies (NEBULIZER) DEVI 1 Device by Does not apply route as needed. 1 Device 0    Allergies as of 02/15/2020 - Review Complete 02/15/2020  Allergen Reaction Noted  . Mercaptopurine Other (See Comments) 03/04/2012  . Remicade [infliximab] Other (See Comments) 03/04/2012    Family History  Problem Relation Age of Onset  . Hypertension Father   . Heart disease Father   . Heart attack Father   .  Diverticulitis Mother   . COPD Mother   . Hypertension Mother   . Heart  disease Mother   . Heart attack Mother   . Heart attack Brother   . Colon cancer Brother   . Diabetes Brother   . Thyroid disease Brother     Social History   Socioeconomic History  . Marital status: Married    Spouse name: [name]  . Number of children: 3  . Years of education: 40  . Highest education level: Not on file  Occupational History  . Occupation: Retired    Fish farm manager: RETIRED  Tobacco Use  . Smoking status: Former Smoker    Packs/day: 1.00    Years: 35.00    Pack years: 35.00    Types: Cigarettes    Start date: 02/22/1967    Quit date: 11/27/2017    Years since quitting: 2.2  . Smokeless tobacco: Never Used  Vaping Use  . Vaping Use: Never used  Substance and Sexual Activity  . Alcohol use: No  . Drug use: No  . Sexual activity: Never  Other Topics Concern  . Not on file  Social History Narrative   Patient is married [name]) and lives at home with her husband.   Patient has three children.   Patient is retired on disability.   Patient drinks two cups of caffeine daily.   Patient is right-handed.         Social Determinants of Health   Financial Resource Strain:   . Difficulty of Paying Living Expenses: Not on file  Food Insecurity:   . Worried About Charity fundraiser in the Last Year: Not on file  . Ran Out of Food in the Last Year: Not on file  Transportation Needs:   . Lack of Transportation (Medical): Not on file  . Lack of Transportation (Non-Medical): Not on file  Physical Activity:   . Days of Exercise per Week: Not on file  . Minutes of Exercise per Session: Not on file  Stress:   . Feeling of Stress : Not on file  Social Connections:   . Frequency of Communication with Friends and Family: Not on file  . Frequency of Social Gatherings with Friends and Family: Not on file  . Attends Religious Services: Not on file  . Active Member of Clubs or Organizations: Not on file  . Attends Archivist Meetings: Not on file  . Marital  Status: Not on file  Intimate Partner Violence:   . Fear of Current or Ex-Partner: Not on file  . Emotionally Abused: Not on file  . Physically Abused: Not on file  . Sexually Abused: Not on file    Review of Systems: See HPI  Physical Exam: Vital signs in last 24 hours: Temp:  [98.4 F (36.9 C)] 98.4 F (36.9 C) (09/22 0649) Pulse Rate:  [95-98] 95 (09/22 0902) Resp:  [15-16] 15 (09/22 0902) BP: (143-159)/(67-70) 143/67 (09/22 0902) SpO2:  [100 %] 100 % (09/22 0902)   Pleasant, alert, slightly frail and chronically ill-appearing African-American female on ER stretcher, in no acute distress.  Chest is clear, heart is without arrhythmia pathologic murmur appreciated, and abdomen is without overt tenderness.  No lower extremity edema, no obvious focal neurologic deficits.  Intake/Output from previous day: No intake/output data recorded. Intake/Output this shift: No intake/output data recorded.  Lab Results: Recent Labs    02/15/20 1110  WBC 6.3  HGB 7.1*  HCT 24.2*  PLT 145*   BMET Recent  Labs    02/15/20 1110  NA 136  K 3.6  CL 103  CO2 18*  GLUCOSE 75  BUN 40*  CREATININE 9.30*  CALCIUM 8.6*   LFT Recent Labs    02/15/20 1110  PROT 6.4*  ALBUMIN 3.2*  AST 18  ALT 14  ALKPHOS 41  BILITOT 0.8   PT/INR No results for input(s): LABPROT, INR in the last 72 hours.  Studies/Results: CT Head Wo Contrast  Result Date: 02/15/2020 CLINICAL DATA:  Headache.  Lung cancer. EXAM: CT HEAD WITHOUT CONTRAST TECHNIQUE: Contiguous axial images were obtained from the base of the skull through the vertex without intravenous contrast. COMPARISON:  MRI 12/01/2019 FINDINGS: Brain: There is an area of low-density noted in the right parietal subcortical white matter, new since prior MRI. Given history of lung cancer, cannot exclude metastasis in this region. No hemorrhage or hydrocephalus. Vascular: No hyperdense vessel or unexpected calcification. Skull: No acute calvarial  abnormality. Sinuses/Orbits: Visualized paranasal sinuses and mastoids clear. Orbital soft tissues unremarkable. Other: None IMPRESSION: Area of subcortical low-density in the right parietal lobe. Cannot exclude focal lesion/metastasis. Consider further evaluation with MRI with and without contrast. Electronically Signed   By: Rolm Baptise M.D.   On: 02/15/2020 11:42    Impression: 1.  Heme positive stool with recent endoscopic and colonoscopic evaluation showing likely source as anastomotic ulceration  2.  Slight drop in hemoglobin over the past 2 weeks, possibly lab variation, possibly multifactorial (low-grade GI tract blood loss, dialysis)  3.  Crohn's ileocolitis, on maintenance therapy with Humira, history of perianal disease, history of ileal resection  4.  Medical illnesses including COPD on home oxygen, lung lesion/possibly lung cancer, and possible newly diagnosed brain metastasis in the setting of headache   Plan: 1.  Since the patient has had recent GI tract evaluation and does not show evidence of active, overt clinical GI bleeding, I would favor observation rather than any further the diagnostic GI intervention such as updated endoscopic studies or a small bowel capsule endoscopy (the latter of which would have a very low yield for finding a treatable lesion).  2.  Transfusion support as needed to correct symptomatic anemia, especially taking into account the patient's chronic lung disease  3.  We will sign off, because I do not think there will be a likely role for [place] in this patient's care during this hospitalization.  However, do not hesitate to call us if you have questions, would like to discuss her case, or if we can be of further assistance in the patient's care.   LOS: 0 days   Youlanda Mighty Markeita Alicia  02/15/2020, 1:21 PM   Pager [phone] If no answer or after 5 PM call [phone]

## 2020-02-15 NOTE — ED Triage Notes (Signed)
To ED for eval of HA with radiation down to right shoulder. States she was in the hospital on 9/10-9/15 for low hgb. Given blood and iron while in the hospital. States she was seen by her PCP on Monday and given pain med that does not work. States she has no n/v. Pt is alert and oriented. Pt is on oxygen atc - 3LNC

## 2020-02-15 NOTE — H&P (Signed)
History and Physical    Natasha [name] WJX:[id number] DOB: 28-May-1945 DOA: 02/15/2020  PCP: [name], MD  Patient coming from: Home I have personally briefly reviewed patient's old medical records in [place]  Chief Complaint: Severe headache since 1 week  HPI: Natasha [name] is a 74 y.o. 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 fib presents to emergency department with worsening headache since 1 week.  Patient reports worsening headache since 1 week, she went to see her PCP on Monday and was prescribed muscle relaxant with no help.  Patient continues to have severe right-sided headache, 10 out of 10, constant, throbbing radiates to her right shoulder therefore she decided to come to ER for further evaluation  Patient denies any association with blurry vision, lightheadedness, dizziness, numbness weakness tingling sensation in hands or feet, head trauma, loss of consciousness, seizures, chest pain, shortness of breath, palpitation, wheezing, fever, chills, nausea, vomiting, abdominal pain, urinary changes.  She reports dark-colored stools however denies over-the-counter use of NSAIDs.  She admitted on 9/6 with acute GI bleeding and underwent EGD on 9/7 which shows  diffuse mild inflammation characterized by congestion and erythema  in the entire examined stomach. A medium nonbleeding diverticulum in the gastric fundus. Duodenitis. Patient underwent colonoscopy 9/09;perianal fistula, perianal skin tag, non-patent endto side ileocolonic anastomosis, characterized by edema, erosion and ulceration.  Patient received 1 unit PRBC and Feraheme on  previous admission.  Her Eliquis was resumed at the time of discharge.  No history of smoking, alcohol, listed drug use.  She is on 3 L of oxygen at home at baseline.  She did not go to dialysis today.  I had detailed conversation regarding patient's CODE STATUS and she agreed with DNR.  ED Course: Upon arrival to ED: Patient's vital signs stable, on 3 L of oxygen via nasal cannula, afebrile with no leukocytosis, H&H 7.1/24.2, MCV: 103.9, platelet: 145, POC occult blood positive.  CMP shows ESRD.  CT head shows area of subcortical low-density in right parietal lobe cannot exclude focal lesion/metastasis recommended MRI.  MRI brain is ordered and is pending.  EDP consulted oncology, GI and nephrology.  [facility] hospitalist consulted for admission.  Review of Systems: As per HPI otherwise negative.    Past Medical History:  Diagnosis Date  . Anal fistula   . Anemia in chronic kidney disease (CKD)   . Aortic insufficiency    Echo 3/18: mod conc LVH, EF 60-65, no RWMA, Gr 1 DD, mod AI, mild MR, normal RVSF, Trivial TR  . Arthritis   . Borderline hypertension   . Bulging disc    L3-L4  . Chronic diarrhea    due to crohn's  . CKD (chronic kidney disease), stage IV ([place])    MWF- [address]  . Crohn's disease ([place])    chronic ileitis  . Dyspnea    with exertion  . Emphysema/COPD ([place])   . GERD (gastroesophageal reflux disease)    denies  . History of blood transfusion   . History of glaucoma   . History of kidney stones   . History of pancreatitis    2008--  mercaptopurine  . History of small bowel obstruction    12-03-2010  due to crohn's ileitis  .  Hypertension    no longer on medications  . Hypothyroidism, postsurgical    multinodule w/ hurthle cells  . Osteoporosis   . PAF (paroxysmal atrial fibrillation) ([place])   . Perianal Crohn's disease ([place])   . Peripheral vascular disease ([place])    blood clot behind knee left leg  . Polyarthralgia   . Seizures ([place])    03/2017  .  Wears partial dentures    upper    Past Surgical History:  Procedure Laterality Date  . ABDOMINAL HYSTERECTOMY  1990   and  Appendectomy  . AV FISTULA PLACEMENT Left 12/08/2017   Procedure: INSERTION OF ARTERIOVENOUS (AV) GRAFT WITH ARTEGRAFT TO LEFT UPPER ARM;  Surgeon: Conrad [place], MD;  Location: [facility];  Service: Vascular;  Laterality: Left;  . BASCILIC VEIN TRANSPOSITION Left 01/09/2017   Procedure: LEFT 1ST STAGE BRACHIAL VEIN TRANSPOSITION;  Surgeon: Conrad Stafford, MD;  Location: [place];  Service: Vascular;  Laterality: Left;  . BASCILIC VEIN TRANSPOSITION Left 06/16/2017   Procedure: Second Stage BASILIC VEIN TRANSPOSITION  LEFT ARM;  Surgeon: Conrad Graham, MD;  Location: [facility];  Service: Vascular;  Laterality: Left;  . BIOPSY  03/09/2018   Procedure: BIOPSY;  Surgeon: Wilford Corner, MD;  [facility];  Service: Endoscopy;;  . BIOPSY  01/31/2020   Procedure: BIOPSY;  Surgeon: Otis Brace, MD;  Location: [place];  Service: Gastroenterology;;  . BIOPSY  02/02/2020   Procedure: BIOPSY;  Surgeon: Otis Brace, MD;  Location: [place];  Service: Gastroenterology;;  . CHOLECYSTECTOMY    . COLON RESECTION  x3 --  1978,  1987, 1989   ILEAL RESECTION x2/   [place]  . COLONOSCOPY WITH PROPOFOL N/A 09/25/2014   Procedure: COLONOSCOPY WITH PROPOFOL;  Surgeon: Garlan Fair, MD;  [facility];  Service: Endoscopy;  Laterality: N/A;  . COLONOSCOPY WITH PROPOFOL N/A 03/09/2018   Procedure: COLONOSCOPY WITH PROPOFOL;  Surgeon: Wilford Corner, MD;  [facility];  Service: Endoscopy;  Laterality: N/A;  . COLONOSCOPY WITH PROPOFOL N/A 02/02/2020   Procedure: COLONOSCOPY WITH PROPOFOL;  Surgeon: Otis Brace, MD;  Location: [place];  Service: Gastroenterology;  Laterality: N/A;  . CYSTOSCOPY W/ URETERAL STENT PLACEMENT Right 11/19/2016   Procedure: CYSTOSCOPY WITH RIGHT RETROGRADE PYELOGRAM/ URETEROSCOPY WITH LASER AND RIGHT  URETERAL STENT PLACEMENT;  Surgeon: Ardis Hughs, MD;  [facility];  Service: Urology;  Laterality: Right;  . ESOPHAGOGASTRODUODENOSCOPY  03/04/2012   Procedure: ESOPHAGOGASTRODUODENOSCOPY (EGD);  Surgeon: Garlan Fair, MD;  [facility];  Service: Endoscopy;  Laterality: N/A;  . ESOPHAGOGASTRODUODENOSCOPY (EGD) WITH PROPOFOL N/A 01/31/2020   Procedure: ESOPHAGOGASTRODUODENOSCOPY (EGD) WITH PROPOFOL;  Surgeon: Otis Brace, MD;  [facility];  Service: Gastroenterology;  Laterality: N/A;  . EXAMINATION UNDER ANESTHESIA N/A 09/12/2014   Procedure: EXAM UNDER ANESTHESIA;  Surgeon: Rolm Bookbinder, MD;  Location: [place] [place];  Service: General;  Laterality: N/A;  . EYE SURGERY     laser  . FLEXIBLE SIGMOIDOSCOPY  03/04/2012   Procedure: FLEXIBLE SIGMOIDOSCOPY;  Surgeon: Garlan Fair, MD;  [facility];  Service: Endoscopy;  Laterality: N/A;  . GLAUCOMA SURGERY Bilateral   . HOLMIUM LASER APPLICATION Right 2/63/7858   Procedure: HOLMIUM LASER APPLICATION;  Surgeon: Ardis Hughs, MD;  [facility];  Service: Urology;  Laterality: Right;  . INCISION AND DRAINAGE PERIRECTAL ABSCESS N/A 09/12/2014   Procedure: IRRIGATION AND DEBRIDEMENT PERIRECTAL ABSCESS;  Surgeon: Rolm Bookbinder, MD;  Location: [place];  Service: General;  Laterality: N/A;  . IR FLUORO GUIDE CV LINE RIGHT  03/20/2017  . IR US GUIDE VASC ACCESS RIGHT  03/20/2017  . NEGATIVE SLEEP STUDY  2008  . PLACEMENT OF SETON N/A 12/01/2014   Procedure: PLACEMENT OF SETON;  Surgeon: [name], MD;  Location: [facility];  Service: General;  Laterality: N/A;  . POLYPECTOMY  03/09/2018   Procedure: POLYPECTOMY;  Surgeon: Wilford Corner, MD;  [facility];  Service: Endoscopy;;  . POLYPECTOMY  02/02/2020   Procedure: POLYPECTOMY;  Surgeon: Otis Brace, MD;  Location: [facility];  Service: Gastroenterology;;  . RECTAL EXAM UNDER ANESTHESIA N/A 12/01/2014    Procedure: RECTAL EXAM UNDER ANESTHESIA;  Surgeon: [name], MD;  [facility];  Service: General;  Laterality: N/A;  . TOTAL THYROIDECTOMY  10-13-2003  . TRANSTHORACIC ECHOCARDIOGRAM  07-11-2013   mild LVH,  ef 55%,  moderate AR,  mild MR and TR,  trivial PR  . TUBAL LIGATION       reports that she quit smoking about 2 years ago. Her smoking use included cigarettes. She started smoking about 53 years ago. She has a 35.00 pack-year smoking history. She has never used smokeless tobacco. She reports that she does not drink alcohol and does not use drugs.  Allergies  Allergen Reactions  . Mercaptopurine Other (See Comments)    Caused pancreatitis  . Remicade [Infliximab] Other (See Comments)    CAUSED JOINT PAIN    Family History  Problem Relation Age of Onset  . Hypertension Father   . Heart disease Father   . Heart attack Father   . Diverticulitis Mother   . COPD Mother   . Hypertension Mother   . Heart disease Mother   . Heart attack Mother   . Heart attack Brother   . Colon cancer Brother   . Diabetes Brother   . Thyroid disease Brother     Prior to Admission medications   Medication Sig Start Date End Date Taking? Authorizing Provider  acetaminophen (TYLENOL) 500 MG tablet Take 1,000 mg by mouth every 6 (six) hours as needed for mild pain.     [provider]  Adalimumab (HUMIRA) 40 MG/0.8ML PSKT Inject 40 mg into the skin every 14 (fourteen) days.     [provider]  albuterol (VENTOLIN HFA) 108 (90 Base) MCG/ACT inhaler INHALE 2 PUFFS BY MOUTH EVERY 6 HOURS IF NEEDED FOR WHEEZING OR SHORTNESS OF BREATH 05/31/19   Chesley Mires, MD  calcitRIOL (ROCALTROL) 0.25 MCG capsule Take 1 capsule (0.25 mcg total) by mouth every Monday, Wednesday, and Friday with hemodialysis. 02/03/20   Regalado, Belkys A, MD  calcium acetate (PHOSLO) 667 MG capsule Take 1 capsule (667 mg total) by mouth 3 (three) times daily with meals. 02/03/20   Regalado,  Belkys A, MD  calcium-vitamin D (OSCAL WITH D) 500-200 MG-UNIT TABS tablet Take 1 tablet by mouth as needed (with dialysis). 02/03/20   Regalado, Belkys A, MD  cyanocobalamin (,VITAMIN B-12,) 1000 MCG/ML injection Inject 1,000 mcg into the muscle every 30 (thirty) days.     [provider]  diltiazem (CARDIZEM) 30 MG tablet Take 1 tablet (30 mg total) by mouth every 8 (eight) hours. 01/24/20   Nahser, Wonda Cheng, MD  ELIQUIS 5 MG TABS tablet Take 2.5 mg by mouth 2 (two) times daily. Takes 1/2 tablet (2.33m) bid 08/23/18   [provider]  fluticasone (FLONASE) 50 MCG/ACT nasal spray Place 1 spray into both nostrils daily as  needed for allergies.  03/19/19   [provider]  Fluticasone-Umeclidin-Vilant (TRELEGY ELLIPTA) 100-62.5-25 MCG/INH AEPB Inhale 1 puff into the lungs daily. 01/17/20   Martyn Ehrich, NP  ipratropium-albuterol (DUONEB) 0.5-2.5 (3) MG/3ML SOLN Take 3 mLs by nebulization every 6 (six) hours as needed. Patient taking differently: Take 3 mLs by nebulization every 6 (six) hours as needed (sob/wheezing).  12/30/18   Fenton Foy, NP  levofloxacin (LEVAQUIN) 250 MG tablet Take 1 tablet (250 mg total) by mouth every other day for 10 days. 02/05/20 02/15/20  Regalado, Jerald Kief A, MD  levothyroxine (SYNTHROID, LEVOTHROID) 125 MCG tablet Take 125 mcg by mouth daily before breakfast.     [provider]  lidocaine-prilocaine (EMLA) cream Apply 1 application topically every Monday, Wednesday, and Friday.  11/29/18   [provider]  loperamide (IMODIUM) 2 MG capsule Take 2 capsules (4 mg total) by mouth as needed for diarrhea or loose stools. 03/27/17   Caren Griffins, MD  midodrine (PROAMATINE) 10 MG tablet Take 1 tablet (10 mg total) by mouth 3 (three) times daily with meals. 01/24/20   Nahser, Wonda Cheng, MD  nitroGLYCERIN (NITROSTAT) 0.4 MG SL tablet Place 1 tablet (0.4 mg total) under the tongue every 5 (five) minutes as needed for chest pain. 05/03/19  12/27/19  Nahser, Wonda Cheng, MD  ondansetron (ZOFRAN ODT) 4 MG disintegrating tablet Take 1 tablet (4 mg total) by mouth every 8 (eight) hours as needed for nausea or vomiting. 02/03/20   Regalado, Belkys A, MD  OXYGEN Inhale 2-3 L into the lungs See admin instructions. Use every night and during the day as needed for shortness of breath    [provider]  pantoprazole (PROTONIX) 40 MG tablet Take 1 tablet (40 mg total) by mouth 2 (two) times daily. 02/03/20 03/04/20  Regalado, Belkys A, MD  predniSONE (DELTASONE) 5 MG tablet Take 5 mg by mouth daily with breakfast.    [provider]  Respiratory Therapy Supplies (NEBULIZER) DEVI 1 Device by Does not apply route as needed. 12/07/18   Fenton Foy, NP    Physical Exam: Vitals:   02/15/20 0649 02/15/20 0902  BP: (!) 159/70 (!) 143/67  Pulse: 98 95  Resp: 16 15  Temp: 98.4 F (36.9 C)   TempSrc: Oral   SpO2: 100% 100%    Constitutional: NAD, calm, comfortable, on 3 L of oxygen via nasal cannula, alert and oriented and communicating well.  Appears dehydrated.  Thin and lean. Eyes: PERRL, lids and conjunctivae normal ENMT: Mucous membranes are dry. Posterior pharynx clear of any exudate or lesions.Normal dentition.  Neck: normal, supple, no masses, no thyromegaly Respiratory: clear to auscultation bilaterally, no wheezing, no crackles. Normal respiratory effort. No accessory muscle use.  Cardiovascular: Regular rate and rhythm, no murmurs / rubs / gallops. No extremity edema. 2+ pedal pulses. No carotid bruits.  Abdomen: no tenderness, no masses palpated. No hepatosplenomegaly. Bowel sounds positive.  Musculoskeletal: no clubbing / cyanosis. No joint deformity upper and lower extremities. Good ROM, no contractures. Normal muscle tone.  Skin: no rashes, lesions, ulcers. No induration Neurologic: CN 2-12 grossly intact. Sensation intact, DTR normal. Strength 5/5 in all 4.  Psychiatric: Normal judgment and insight. Alert and  oriented x 3. Normal mood.    Labs on Admission: I have personally reviewed following labs and imaging studies  CBC: Recent Labs  Lab 02/15/20 1110  WBC 6.3  HGB 7.1*  HCT 24.2*  MCV 103.9*  PLT 145*  Basic Metabolic Panel: Recent Labs  Lab 02/15/20 1110  NA 136  K 3.6  CL 103  CO2 18*  GLUCOSE 75  BUN 40*  CREATININE 9.30*  CALCIUM 8.6*   GFR: Estimated Creatinine Clearance: 4.2 mL/min (A) (by C-G formula based on SCr of 9.3 mg/dL (H)). Liver Function Tests: Recent Labs  Lab 02/15/20 1110  AST 18  ALT 14  ALKPHOS 41  BILITOT 0.8  PROT 6.4*  ALBUMIN 3.2*   No results for input(s): LIPASE, AMYLASE in the last 168 hours. No results for input(s): AMMONIA in the last 168 hours. Coagulation Profile: No results for input(s): INR, PROTIME in the last 168 hours. Cardiac Enzymes: No results for input(s): CKTOTAL, CKMB, CKMBINDEX, TROPONINI in the last 168 hours. BNP (last 3 results) No results for input(s): PROBNP in the last 8760 hours. HbA1C: No results for input(s): HGBA1C in the last 72 hours. CBG: No results for input(s): GLUCAP in the last 168 hours. Lipid Profile: No results for input(s): CHOL, HDL, LDLCALC, TRIG, CHOLHDL, LDLDIRECT in the last 72 hours. Thyroid Function Tests: No results for input(s): TSH, T4TOTAL, FREET4, T3FREE, THYROIDAB in the last 72 hours. Anemia Panel: No results for input(s): VITAMINB12, FOLATE, FERRITIN, TIBC, IRON, RETICCTPCT in the last 72 hours. Urine analysis:    Component Value Date/Time   COLORURINE YELLOW 11/18/2019 2138   APPEARANCEUR CLEAR 11/18/2019 2138   LABSPEC 1.019 11/18/2019 2138   PHURINE 6.0 11/18/2019 2138   GLUCOSEU 50 (A) 11/18/2019 2138   HGBUR MODERATE (A) 11/18/2019 2138   BILIRUBINUR NEGATIVE 11/18/2019 2138   [place] NEGATIVE 11/18/2019 2138   PROTEINUR 30 (A) 11/18/2019 2138   UROBILINOGEN 0.2 09/09/2014 0850   NITRITE NEGATIVE 11/18/2019 2138   LEUKOCYTESUR NEGATIVE 11/18/2019 2138     Radiological Exams on Admission: CT Head Wo Contrast  Result Date: 02/15/2020 CLINICAL DATA:  Headache.  Lung cancer. EXAM: CT HEAD WITHOUT CONTRAST TECHNIQUE: Contiguous axial images were obtained from the base of the skull through the vertex without intravenous contrast. COMPARISON:  MRI 12/01/2019 FINDINGS: Brain: There is an area of low-density noted in the right parietal subcortical white matter, new since prior MRI. Given history of lung cancer, cannot exclude metastasis in this region. No hemorrhage or hydrocephalus. Vascular: No hyperdense vessel or unexpected calcification. Skull: No acute calvarial abnormality. Sinuses/Orbits: Visualized paranasal sinuses and mastoids clear. Orbital soft tissues unremarkable. Other: None IMPRESSION: Area of subcortical low-density in the right parietal lobe. Cannot exclude focal lesion/metastasis. Consider further evaluation with MRI with and without contrast. Electronically Signed   By: Rolm Baptise M.D.   On: 02/15/2020 11:42    EKG: Independently reviewed.  Normal sinus rhythm.  No ST elevation or depression noted.  Assessment/Plan Principal Problem:   Headache Active Problems:   ESRD on dialysis (HCC)   Hypothyroidism   Thrombocytopenia (H[facility])   COPD (chronic obstructive pulmonary disease) (HCC)   Chronic respiratory failure with hypoxia (HCC)   Metastatic lung carcinoma, left (HCC)   Acute blood loss anemia   GERD (gastroesophageal reflux disease)    Severe headache: -Differential diagnosis include brain mets.  Patient has underlying lung carcinoma. -Reviewed CT head.  MRI brain is pending.  No focal deficit noted on exam. -Oncology has been consulted. -Monitor vitals closely.  Tylenol/oxycodone/Dilaudid as needed for mild/moderate/severe pain respectively. -Frequent neuro check. -On fall/seizure precautions  Acute on chronic anemia: -Occult blood positive.  H&H 7.1/24.2 was 8.5/25.5-12 days ago. -Check iron studies, B12, folate.   Monitor H&H closely. -GI has  been consulted.  Hold Eliquis for now. -Transfuse 1 unit at the time of dialysis.  ESRD on hemodialysis: Patient appears euvolemic on exam.  She missed hemodialysis appointment this morning. -Potassium: WNL.  EDP consulted nephrology for dialysis.  Chronic hypoxemic respiratory failure secondary to COPD: On 3 L at baseline.  No wheezing noted on exam. -Continue home inhalers.  Crohn's disease: History of Crohn's ileocolitis, perianal disease, ileal resection: -Continue Humira and p.o. prednisone  Paroxysmal A. Fib: -Rate controlled.  Continue Cardizem, hold Eliquis for now.  Hypothyroidism: Continue levothyroxine  History of PE/DVT: Hold Eliquis for now due to concern of GI bleeding  ?Metastatic lung cancer: Followed by oncology and pulmonology outpatient.   DVT prophylaxis: SCD Code Status: DNR-confirmed with patient Family Communication: None present at bedside.  Plan of care discussed with patient in length and she verbalized understanding and agreed with it. Disposition Plan: Home in 2 to 3 days and Consults called: Nephrology, oncology, GI Admission status: Inpatient   Mckinley Jewel MD [facility]  If 7PM-7AM, please contact night-coverage [email or web address] Password [email or web address]  02/15/2020, 1:51 PM

## 2020-02-15 NOTE — ED Provider Notes (Signed)
[facility] EMERGENCY DEPARTMENT Provider Note   CSN: [id number] Arrival date & time: 02/15/20  4081     History Chief Complaint  Patient presents with  . Headache    Natasha [name] is a 74 y.o. female  past medical history significant for ESRD on hemodialysis on MWF, Crohn's disease on Humira and prednisone, lung lesion concerning for lung cancer, anemia, thrombocytopenia, SVT, history of PE on Eliquis that presents with headache on the right side for the past week.  Patient was recently admitted for symptomatic anemia with guaiac positive.After her discharge states that on Wednesday, week ago she started having headache in her right lower lobe of her head, states that is throbbing and constant.  States that headache has been gradually worsening.  States that is the worst headache of her life.  No vision changes, blurry vision, spots in her vision, eye pain, numbness, tingling, syncope, weakness, chest pain, shortness of breath.  No neck pain.  States that headache radiates down into her right shoulder.  Does not extend past this.  No true shoulder pain or pain radiating down into her arm.  No trauma to this area.  States that her head feels slightly swollen as well.  Has never had a headache like this before.  Has been trying over-the-counter medications, went to see her PCP 3 days ago who prescribed her muscle relaxants which have not been helping.  Headache is not positional or exertional.  No worsening or alleviating factors.  Patient did not go to dialysis today. HPI     Past Medical History:  Diagnosis Date  . Anal fistula   . Anemia in chronic kidney disease (CKD)   . Aortic insufficiency    Echo 3/18: mod conc LVH, EF 60-65, no RWMA, Gr 1 DD, mod AI, mild MR, normal RVSF, Trivial TR  . Arthritis   . Borderline hypertension   . Bulging disc    L3-L4  . Chronic diarrhea    due to crohn's  . CKD (chronic kidney disease), stage IV ([place])    MWF- [address]    . Crohn's disease ([place])    chronic ileitis  . Dyspnea    with exertion  . Emphysema/COPD ([place])   . GERD (gastroesophageal reflux disease)    denies  . History of blood transfusion   . History of glaucoma   . History of kidney stones   . History of pancreatitis    2008--  mercaptopurine  . History of small bowel obstruction    12-03-2010  due to crohn's ileitis  . Hypertension    no longer on medications  . Hypothyroidism, postsurgical    multinodule w/ hurthle cells  . Osteoporosis   . PAF (paroxysmal atrial fibrillation) ([place])   . Perianal Crohn's disease ([place])   . Peripheral vascular disease ([place])    blood clot behind knee left leg  . Polyarthralgia   . Seizures ([place])    03/2017  . Wears partial dentures    upper    Patient Active Problem List   Diagnosis Date Noted  . GERD (gastroesophageal reflux disease)   . Headache   . Acute GI bleeding 01/30/2020  . Acute blood loss anemia 01/30/2020  . Supraventricular tachycardia ([place]) 12/27/2019  . Arrhythmia 11/19/2019  . Elevated troponin 11/19/2019  . Epigastric abdominal pain 11/18/2019  . Malignant neoplasm of overlapping sites of left lung ([place]) 11/17/2019  . Metastatic lung carcinoma, left ([place]) 11/15/2019  . Goals of care, counseling/discussion  11/15/2019  . Pneumonia of right lower lobe due to infectious organism 09/27/2019  . History of COVID-19 04/13/2019  . Acute on chronic respiratory failure with hypoxia ([place]) 04/13/2019  . Chronic anticoagulation 04/13/2019  . Medication management 02/22/2019  . Chronic respiratory failure with hypoxia ([place]) 09/23/2018  . Hx of Pulmonary embolism ([place]) 05/28/2018  . COPD with acute exacerbation ([place]) 03/31/2018  . Acute sinusitis 03/31/2018  . Crohn's disease of colon with complication ([place])   . Aortic insufficiency 07/30/2016  . Atherosclerosis of aorta ([place]) 07/30/2016  . Pressure injury of skin 07/14/2016  . Gastroenteritis, acute 07/12/2016  . COPD (chronic  obstructive pulmonary disease) ([place]) 10/12/2015  . Malnutrition of moderate degree ([place]) 09/13/2014  . history of Left leg DVT 09/10/2014  . Hypotension 09/10/2014  . Hypoglycemia 09/10/2014  . Ischiorectal abscess 09/09/2014  . Increased anion gap metabolic acidosis   . Generalized abdominal pain   . Chronic diastolic CHF (congestive heart failure) ([place])   . Hypertensive heart disease   . Secondary hyperparathyroidism ([place])   . Other specified hypothyroidism   . Right shoulder pain   . Thrombocytopenia ([place])   . Severe protein-calorie malnutrition ([place])   . Thyroid activity decreased   . Emphysema of lung ([place])   . Nephrolithiasis 03/29/2014  . Spinal stenosis of lumbar region 11/01/2013  . Bilateral leg pain 09/28/2013  . Emphysema/COPD ([place])   . Shortness of breath 09/20/2013  . Protein-calorie malnutrition, severe ([place]) 05/14/2013  . Salmonella enteritidis 06/11/2012  . Hypocalcemia 06/10/2012  . Hypothyroidism 06/10/2012  . Anemia of chronic disease 06/09/2012  . Hypomagnesemia 06/09/2012  . ESRD on dialysis ([place]) 06/09/2012  . Dehydration 06/07/2012  . Crohn's disease without complication ([place]) 45/07/8880  . Metabolic acidosis 80/07/4915  . Hypokalemia 06/07/2012  . Nausea vomiting and diarrhea 06/07/2012  . UTI (lower urinary tract infection) 06/07/2012    Past Surgical History:  Procedure Laterality Date  . ABDOMINAL HYSTERECTOMY  1990   and  Appendectomy  . AV FISTULA PLACEMENT Left 12/08/2017   Procedure: INSERTION OF ARTERIOVENOUS (AV) GRAFT WITH ARTEGRAFT TO LEFT UPPER ARM;  Surgeon: Conrad Azure, MD;  Location: [place];  Service: Vascular;  Laterality: Left;  . BASCILIC VEIN TRANSPOSITION Left 01/09/2017   Procedure: LEFT 1ST STAGE BRACHIAL VEIN TRANSPOSITION;  Surgeon: Conrad Siletz, MD;  Location: [place];  Service: Vascular;  Laterality: Left;  . BASCILIC VEIN TRANSPOSITION Left 06/16/2017   Procedure: Second Stage BASILIC VEIN TRANSPOSITION  LEFT ARM;   Surgeon: Conrad Lorena, MD;  Location: [place];  Service: Vascular;  Laterality: Left;  . BIOPSY  03/09/2018   Procedure: BIOPSY;  Surgeon: Wilford Corner, MD;  Location: [facility];  Service: Endoscopy;;  . BIOPSY  01/31/2020   Procedure: BIOPSY;  Surgeon: Otis Brace, MD;  Location: [place];  Service: Gastroenterology;;  . BIOPSY  02/02/2020   Procedure: BIOPSY;  Surgeon: Otis Brace, MD;  Location: [place];  Service: Gastroenterology;;  . CHOLECYSTECTOMY    . COLON RESECTION  x3 --  1978,  1987, 1989   ILEAL RESECTION x2/   [place]  . COLONOSCOPY WITH PROPOFOL N/A 09/25/2014   Procedure: COLONOSCOPY WITH PROPOFOL;  Surgeon: Garlan Fair, MD;  [facility];  Service: Endoscopy;  Laterality: N/A;  . COLONOSCOPY WITH PROPOFOL N/A 03/09/2018   Procedure: COLONOSCOPY WITH PROPOFOL;  Surgeon: Wilford Corner, MD;  [facility];  Service: Endoscopy;  Laterality: N/A;  . COLONOSCOPY WITH PROPOFOL N/A 02/02/2020  Procedure: COLONOSCOPY WITH PROPOFOL;  Surgeon: Otis Brace, MD;  Location: [place];  Service: Gastroenterology;  Laterality: N/A;  . CYSTOSCOPY W/ URETERAL STENT PLACEMENT Right 11/19/2016   Procedure: CYSTOSCOPY WITH RIGHT RETROGRADE PYELOGRAM/ URETEROSCOPY WITH LASER AND RIGHT URETERAL STENT PLACEMENT;  Surgeon: Ardis Hughs, MD;  [facility];  Service: Urology;  Laterality: Right;  . ESOPHAGOGASTRODUODENOSCOPY  03/04/2012   Procedure: ESOPHAGOGASTRODUODENOSCOPY (EGD);  Surgeon: Garlan Fair, MD;  [facility];  Service: Endoscopy;  Laterality: N/A;  . ESOPHAGOGASTRODUODENOSCOPY (EGD) WITH PROPOFOL N/A 01/31/2020   Procedure: ESOPHAGOGASTRODUODENOSCOPY (EGD) WITH PROPOFOL;  Surgeon: Otis Brace, MD;  [facility];  Service: Gastroenterology;  Laterality: N/A;  . EXAMINATION UNDER ANESTHESIA N/A 09/12/2014   Procedure: EXAM UNDER ANESTHESIA;  Surgeon: Rolm Bookbinder, MD;  Location:  [place];  Service: General;  Laterality: N/A;  . EYE SURGERY     laser  . FLEXIBLE SIGMOIDOSCOPY  03/04/2012   Procedure: FLEXIBLE SIGMOIDOSCOPY;  Surgeon: Garlan Fair, MD;  [facility];  Service: Endoscopy;  Laterality: N/A;  . GLAUCOMA SURGERY Bilateral   . HOLMIUM LASER APPLICATION Right 9/93/5701   Procedure: HOLMIUM LASER APPLICATION;  Surgeon: Ardis Hughs, MD;  [facility];  Service: Urology;  Laterality: Right;  . INCISION AND DRAINAGE PERIRECTAL ABSCESS N/A 09/12/2014   Procedure: IRRIGATION AND DEBRIDEMENT PERIRECTAL ABSCESS;  Surgeon: Rolm Bookbinder, MD;  Location: [place];  Service: General;  Laterality: N/A;  . IR FLUORO GUIDE CV LINE RIGHT  03/20/2017  . IR US GUIDE VASC ACCESS RIGHT  03/20/2017  . NEGATIVE SLEEP STUDY  2008  . PLACEMENT OF SETON N/A 12/01/2014   Procedure: PLACEMENT OF SETON;  Surgeon: [name], MD;  Location[facility];  Service: General;  Laterality: N/A;  . POLYPECTOMY  03/09/2018   Procedure: POLYPECTOMY;  Surgeon: Wilford Corner, MD;  [facility];  Service: Endoscopy;;  . POLYPECTOMY  02/02/2020   Procedure: POLYPECTOMY;  Surgeon: Otis Brace, MD;  Location: [facility];  Service: Gastroenterology;;  . RECTAL EXAM UNDER ANESTHESIA N/A 12/01/2014   Procedure: RECTAL EXAM UNDER ANESTHESIA;  Surgeon: [name], MD;  Location: [facility];  Service: General;  Laterality: N/A;  . TOTAL THYROIDECTOMY  10-13-2003  . TRANSTHORACIC ECHOCARDIOGRAM  07-11-2013   mild LVH,  ef 55%,  moderate AR,  mild MR and TR,  trivial PR  . TUBAL LIGATION       OB History   No obstetric history on file.     Family History  Problem Relation Age of Onset  . Hypertension Father   . Heart disease Father   . Heart attack Father   . Diverticulitis Mother   . COPD Mother   . Hypertension Mother   . Heart disease Mother   . Heart attack Mother   . Heart attack Brother   . Colon cancer  Brother   . Diabetes Brother   . Thyroid disease Brother     Social History   Tobacco Use  . Smoking status: Former Smoker    Packs/day: 1.00    Years: 35.00    Pack years: 35.00    Types: Cigarettes    Start date: 02/22/1967    Quit date: 11/27/2017    Years since quitting: 2.2  . Smokeless tobacco: Never Used  Vaping Use  . Vaping Use: Never used  Substance Use Topics  . Alcohol use: No  . Drug use: No    Home Medications Prior to Admission  medications   Medication Sig Start Date End Date Taking? Authorizing Provider  acetaminophen (TYLENOL) 500 MG tablet Take 1,000 mg by mouth every 6 (six) hours as needed for mild pain.    Yes [provider]  Adalimumab (HUMIRA) 40 MG/0.8ML PSKT Inject 40 mg into the skin every 14 (fourteen) days.    Yes [provider]  albuterol (VENTOLIN HFA) 108 (90 Base) MCG/ACT inhaler INHALE 2 PUFFS BY MOUTH EVERY 6 HOURS IF NEEDED FOR WHEEZING OR SHORTNESS OF BREATH 05/31/19  Yes Chesley Mires, MD  calcitRIOL (ROCALTROL) 0.25 MCG capsule Take 1 capsule (0.25 mcg total) by mouth every Monday, Wednesday, and Friday with hemodialysis. 02/03/20  Yes Regalado, Belkys A, MD  calcium acetate (PHOSLO) 667 MG capsule Take 1 capsule (667 mg total) by mouth 3 (three) times daily with meals. 02/03/20  Yes Regalado, Belkys A, MD  calcium-vitamin D (OSCAL WITH D) 500-200 MG-UNIT TABS tablet Take 1 tablet by mouth as needed (with dialysis). 02/03/20  Yes Regalado, Belkys A, MD  diltiazem (CARDIZEM) 30 MG tablet Take 1 tablet (30 mg total) by mouth every 8 (eight) hours. 01/24/20  Yes Nahser, Wonda Cheng, MD  ELIQUIS 5 MG TABS tablet Take 2.5 mg by mouth 2 (two) times daily. Takes 1/2 tablet (2.66m) bid 08/23/18  Yes [provider]  fluticasone (FLONASE) 50 MCG/ACT nasal spray Place 1 spray into both nostrils daily as needed for allergies.  03/19/19  Yes [provider]  Fluticasone-Umeclidin-Vilant (TRELEGY ELLIPTA) 100-62.5-25 MCG/INH AEPB  Inhale 1 puff into the lungs daily. 01/17/20  Yes WMartyn Ehrich NP  ipratropium-albuterol (DUONEB) 0.5-2.5 (3) MG/3ML SOLN Take 3 mLs by nebulization every 6 (six) hours as needed. Patient taking differently: Take 3 mLs by nebulization every 6 (six) hours as needed (sob/wheezing).  12/30/18  Yes NFenton Foy NP  levofloxacin (LEVAQUIN) 250 MG tablet Take 1 tablet (250 mg total) by mouth every other day for 10 days. 02/05/20 02/15/20 Yes Regalado, Belkys A, MD  levothyroxine (SYNTHROID, LEVOTHROID) 125 MCG tablet Take 125 mcg by mouth daily before breakfast.    Yes [provider]  lidocaine-prilocaine (EMLA) cream Apply 1 application topically every Monday, Wednesday, and Friday.  11/29/18  Yes [provider]  loperamide (IMODIUM) 2 MG capsule Take 2 capsules (4 mg total) by mouth as needed for diarrhea or loose stools. 03/27/17  Yes GCaren Griffins MD  metoprolol tartrate (LOPRESSOR) 25 MG tablet Take 25 mg by mouth 2 (two) times daily. 02/10/20  Yes [provider]  midodrine (PROAMATINE) 10 MG tablet Take 1 tablet (10 mg total) by mouth 3 (three) times daily with meals. 01/24/20  Yes Nahser, PWonda Cheng MD  nitroGLYCERIN (NITROSTAT) 0.4 MG SL tablet Place 1 tablet (0.4 mg total) under the tongue every 5 (five) minutes as needed for chest pain. 05/03/19 02/15/20 Yes Nahser, PWonda Cheng MD  ondansetron (ZOFRAN ODT) 4 MG disintegrating tablet Take 1 tablet (4 mg total) by mouth every 8 (eight) hours as needed for nausea or vomiting. 02/03/20  Yes Regalado, Belkys A, MD  OXYGEN Inhale 2-3 L into the lungs See admin instructions. Use every night and during the day as needed for shortness of breath   Yes [provider]  pantoprazole (PROTONIX) 40 MG tablet Take 1 tablet (40 mg total) by mouth 2 (two) times daily. 02/03/20 03/04/20 Yes Regalado, Belkys A, MD  predniSONE (DELTASONE) 5 MG tablet Take 5 mg by mouth daily with breakfast.   Yes [provider]  cyanocobalamin (,VITAMIN B-12,) 1000 MCG/ML injection Inject 1,000 mcg into the muscle every 30 (thirty) days.     [provider]  Respiratory Therapy Supplies (NEBULIZER) DEVI 1 Device by Does not apply route as needed. 12/07/18   Fenton Foy, NP    Allergies    Mercaptopurine and Remicade [infliximab]  Review of Systems   Review of Systems  Constitutional: Positive for fatigue. Negative for chills, diaphoresis and fever.  HENT: Negative for congestion, sore throat and trouble swallowing.   Eyes: Negative for pain and visual disturbance.  Respiratory: Negative for cough, shortness of breath and wheezing.   Cardiovascular: Negative for chest pain, palpitations and leg swelling.  Gastrointestinal: Negative for abdominal distention, abdominal pain, diarrhea, nausea and vomiting.  Genitourinary: Negative for difficulty urinating.  Musculoskeletal: Negative for back pain, neck pain and neck stiffness.  Skin: Negative for pallor.  Neurological: Positive for headaches. Negative for dizziness, seizures, syncope, speech difficulty, weakness, light-headedness and numbness.  Psychiatric/Behavioral: Negative for confusion.    Physical Exam Updated Vital Signs BP 138/69 (BP Location: Right Arm)   Pulse (!) 103   Temp 98.4 F (36.9 C) (Oral)   Resp 14   SpO2 100%   Physical Exam Constitutional:      General: She is not in acute distress.    Appearance: Normal appearance. She is ill-appearing. She is not toxic-appearing or diaphoretic.     Comments: Patient is chronically ill-appearing, is on 4 L of oxygen, at baseline.  HENT:     Head:     Comments: Tenderness to right parietal area, no hematoma or erythema or warmth noted.    Mouth/Throat:     Mouth: Mucous membranes are moist.     Pharynx: Oropharynx is clear.  Eyes:     General: No scleral icterus.    Extraocular Movements: Extraocular movements intact.     Pupils: Pupils are equal, round, and reactive to light.   Cardiovascular:     Rate and Rhythm: Normal rate and regular rhythm.     Pulses: Normal pulses.     Heart sounds: Normal heart sounds.  Pulmonary:     Effort: Pulmonary effort is normal. No respiratory distress.     Breath sounds: Normal breath sounds. No stridor. No wheezing, rhonchi or rales.  Chest:     Chest wall: No tenderness.  Abdominal:     General: Abdomen is flat. There is no distension.     Palpations: Abdomen is soft.     Tenderness: There is no abdominal tenderness. There is no guarding or rebound.  Genitourinary:    Comments: Chaperone present. Digital Rectal exam reveals sphincter with good tone. No external hemorrhoids, masses, or fissures. Stool color is brown with no overt blood. No gross melena.    Musculoskeletal:        General: No swelling or tenderness. Normal range of motion.     Cervical back: Normal range of motion and neck supple. No rigidity.     Right lower leg: No edema.     Left lower leg: No edema.  Skin:    General: Skin is warm and dry.     Capillary Refill: Capillary refill takes less than 2 seconds.     Coloration: Skin is not pale.  Neurological:     General: No focal deficit present.     Mental Status: She is alert and oriented to person, place, and time.     Comments: Alert. Clear speech. No facial droop. CNIII-XII grossly intact.  Bilateral upper and lower extremities' sensation grossly intact. 5/5 symmetric strength with grip strength and with plantar and dorsi flexion bilaterally. Normal finger to nose bilaterally. Negative pronator drift.   Psychiatric:        Mood and Affect: Mood normal.        Behavior: Behavior normal.     ED Results / Procedures / Treatments   Labs (all labs ordered are listed, but only abnormal results are displayed) Labs Reviewed  CBC - Abnormal; Notable for the following components:      Result Value   RBC 2.33 (*)    Hemoglobin 7.1 (*)    HCT 24.2 (*)    MCV 103.9 (*)    MCHC 29.3 (*)    RDW 19.2 (*)     Platelets 145 (*)    All other components within normal limits  COMPREHENSIVE METABOLIC PANEL - Abnormal; Notable for the following components:   CO2 18 (*)    BUN 40 (*)    Creatinine, Ser 9.30 (*)    Calcium 8.6 (*)    Total Protein 6.4 (*)    Albumin 3.2 (*)    GFR calc non Af Amer 4 (*)    GFR calc Af Amer 4 (*)    All other components within normal limits  MAGNESIUM - Abnormal; Notable for the following components:   Magnesium 1.6 (*)    All other components within normal limits  PHOSPHORUS - Abnormal; Notable for the following components:   Phosphorus 8.2 (*)    All other components within normal limits  POC OCCULT BLOOD, ED - Abnormal; Notable for the following components:   Fecal Occult Bld POSITIVE (*)    All other components within normal limits  SARS CORONAVIRUS 2 BY RT PCR (HOSPITAL ORDER, [facility])  APTT  PROTIME-INR  VITAMIN B12  FOLATE  IRON AND TIBC  FERRITIN  POC OCCULT BLOOD, ED  TYPE AND SCREEN    EKG EKG Interpretation  Date/Time:  Wednesday February 15 2020 11:14:02 EDT Ventricular Rate:  96 PR Interval:  126 QRS Duration: 76 QT Interval:  366 QTC Calculation: 462 R Axis:   65 Text Interpretation: Normal sinus rhythm Cannot rule out Anterior infarct , age undetermined Abnormal ECG No old tracing to compare Confirmed by Dorie Rank [phone]) on 02/15/2020 11:46:00 AM   Radiology CT Head Wo Contrast  Result Date: 02/15/2020 CLINICAL DATA:  Headache.  Lung cancer. EXAM: CT HEAD WITHOUT CONTRAST TECHNIQUE: Contiguous axial images were obtained from the base of the skull through the vertex without intravenous contrast. COMPARISON:  MRI 12/01/2019 FINDINGS: Brain: There is an area of low-density noted in the right parietal subcortical white matter, new since prior MRI. Given history of lung cancer, cannot exclude metastasis in this region. No hemorrhage or hydrocephalus. Vascular: No hyperdense vessel or unexpected  calcification. Skull: No acute calvarial abnormality. Sinuses/Orbits: Visualized paranasal sinuses and mastoids clear. Orbital soft tissues unremarkable. Other: None IMPRESSION: Area of subcortical low-density in the right parietal lobe. Cannot exclude focal lesion/metastasis. Consider further evaluation with MRI with and without contrast. Electronically Signed   By: Rolm Baptise M.D.   On: 02/15/2020 11:42    Procedures Procedures (including critical care time)  Medications Ordered in ED Medications  LORazepam (ATIVAN) injection 0.5 mg (0 mg Intravenous Hold 02/15/20 1445)  acetaminophen (TYLENOL) tablet 650 mg (has no administration in time range)    Or  acetaminophen (TYLENOL) suppository 650 mg (has no administration in time  range)  oxyCODONE (Oxy IR/ROXICODONE) immediate release tablet 5 mg (has no administration in time range)  HYDROmorphone (DILAUDID) injection 0.5-1 mg (has no administration in time range)  ondansetron (ZOFRAN) tablet 4 mg (has no administration in time range)    Or  ondansetron (ZOFRAN) injection 4 mg (has no administration in time range)  hydrALAZINE (APRESOLINE) injection 10 mg (has no administration in time range)  Chlorhexidine Gluconate Cloth 2 % PADS 6 each (has no administration in time range)  LORazepam (ATIVAN) tablet 1 mg (has no administration in time range)  morphine 4 MG/ML injection 4 mg (4 mg Intravenous Given 02/15/20 1128)  prochlorperazine (COMPAZINE) injection 10 mg (10 mg Intravenous Given 02/15/20 1128)    ED Course  I have reviewed the triage vital signs and the nursing notes.  Pertinent labs & imaging results that were available during my care of the patient were reviewed by me and considered in my medical decision making (see chart for details).    MDM Rules/Calculators/A&P                           Natasha [name] is a 74 y.o. female  past medical history significant for ESRD on hemodialysis on MWF, Crohn's disease on Humira and  prednisone, lung lesion concerning for lung cancer, anemia, thrombocytopenia, SVT, history of PE on Eliquis that presents with headache on the right side for the past week.  Will obtain basic labs and head CT at this time.  Patient has normal neuro exam.  Work-up concerning for metastasis to brain, will consult oncology.  Work-up also concerning for GI bleed, hemoglobin 7.1, Hemoccult positive.  Patient sees Dr. Earlie Server for oncology, his last note states that he was suspicious for metastasis of lung carcinoma.  Tried to contact him and was unsuccessful.  1240 spoke to Dr. Alvy Bimler, oncology.  Recommends MRI brain with and without to evaluate for edema if patient is going to be admitted, also recommends transfusion today. She told me to message Dr. Earlie Server so he is aware of the patient.  She also told me to consult nephrology because patient is hemodialysis patient and they can dialyze patient well giving blood to make sure patient is not fluid overloaded.  Will consult nephrology this time.  1258 spoke to Dr. Marval Regal, nephrology who does agree with transfusion at the same time of dialysis since patient is due for dialysis today.    115 spoke to Dr. Cristina Gong, GI who is aware of patient. 130 spoke to Dr. Edwyna Perfect, hospitalist who will accept the patient.  The patient appears reasonably stabilized for admission considering the current resources, flow, and capabilities available in the ED at this time, and I doubt any other [facility] requiring further screening and/or treatment in the ED prior to admission.  Discussed case with my attending physician who is agreeable to plan to consult for admission. 3:02 PM discussed case with hospitalist who agrees to accept care of patient.  The patient appears reasonably stabilized for admission considering the current resources, flow, and capabilities available in the ED at this time, and I doubt any other [facility] requiring further screening and/or treatment in the ED prior  to admission.  . Final Clinical Impression(s) / ED Diagnoses Final diagnoses:  Brain lesion  Low hemoglobin  Stool guaiac positive    Rx / DC Orders ED Discharge Orders    None       Alfredia Client, PA-C 02/15/20 1503  Dorie Rank, MD 02/16/20 [phone]

## 2020-02-15 NOTE — Progress Notes (Signed)
Brought patient to MRI. Patient refusing scan as of right now., Stated that she "doesn't feel like she could tolerate scan at the time due to her breathing." Patient was sent back to ED. RN notified.

## 2020-02-15 NOTE — Progress Notes (Signed)
Attempted to get patient down for her MRI for the second time today, patient is still refusing to come.

## 2020-02-15 NOTE — ED Notes (Signed)
RN attempted to call report to 3W RN

## 2020-02-16 ENCOUNTER — Ambulatory Visit
Admit: 2020-02-16 | Discharge: 2020-02-16 | Disposition: A | Discharge status: Home / Self Care | Payer: Medicare Other | Attending: Radiation Oncology | Admitting: Radiation Oncology

## 2020-02-16 ENCOUNTER — Ambulatory Visit: Payer: Medicare Other | Admitting: Primary Care

## 2020-02-16 ENCOUNTER — Other Ambulatory Visit: Payer: Self-pay | Admitting: Radiation Therapy

## 2020-02-16 DIAGNOSIS — R918 Other nonspecific abnormal finding of lung field: Secondary | ICD-10-CM

## 2020-02-16 DIAGNOSIS — G4489 Other headache syndrome: Secondary | ICD-10-CM

## 2020-02-16 DIAGNOSIS — C7802 Secondary malignant neoplasm of left lung: Secondary | ICD-10-CM

## 2020-02-16 DIAGNOSIS — Z992 Dependence on renal dialysis: Secondary | ICD-10-CM

## 2020-02-16 DIAGNOSIS — J449 Chronic obstructive pulmonary disease, unspecified: Secondary | ICD-10-CM

## 2020-02-16 DIAGNOSIS — G939 Disorder of brain, unspecified: Secondary | ICD-10-CM

## 2020-02-16 DIAGNOSIS — N186 End stage renal disease: Secondary | ICD-10-CM

## 2020-02-16 DIAGNOSIS — C7931 Secondary malignant neoplasm of brain: Principal | ICD-10-CM

## 2020-02-16 LAB — COMPREHENSIVE METABOLIC PANEL
ALT: 16 U/L (ref 0–44)
AST: 20 U/L (ref 15–41)
Albumin: 2.9 g/dL — ABNORMAL LOW (ref 3.5–5.0)
Alkaline Phosphatase: 44 U/L (ref 38–126)
Anion gap: 17 — ABNORMAL HIGH (ref 5–15)
BUN: 46 mg/dL — ABNORMAL HIGH (ref 8–23)
CO2: 14 mmol/L — ABNORMAL LOW (ref 22–32)
Calcium: 8.5 mg/dL — ABNORMAL LOW (ref 8.9–10.3)
Chloride: 107 mmol/L (ref 98–111)
Creatinine, Ser: 10.76 mg/dL — ABNORMAL HIGH (ref 0.44–1.00)
GFR calc Af Amer: 4 mL/min — ABNORMAL LOW (ref >60)
GFR calc non Af Amer: 3 mL/min — ABNORMAL LOW (ref >60)
Glucose, Bld: 43 mg/dL — CL (ref 70–99)
Potassium: 4.1 mmol/L (ref 3.5–5.1)
Sodium: 138 mmol/L (ref 135–145)
Total Bilirubin: 1.2 mg/dL (ref 0.3–1.2)
Total Protein: 6 g/dL — ABNORMAL LOW (ref 6.5–8.1)

## 2020-02-16 LAB — GLUCOSE, CAPILLARY
Glucose-Capillary: 36 mg/dL — CL (ref 70–99)
Glucose-Capillary: 55 mg/dL — ABNORMAL LOW (ref 70–99)
Glucose-Capillary: 97 mg/dL (ref 70–99)
Glucose-Capillary: 139 mg/dL — ABNORMAL HIGH (ref 70–99)
Glucose-Capillary: 162 mg/dL — ABNORMAL HIGH (ref 70–99)

## 2020-02-16 LAB — CBC
HCT: 22.9 % — ABNORMAL LOW (ref 36.0–46.0)
Hemoglobin: 6.9 g/dL — CL (ref 12.0–15.0)
MCH: 30.5 pg (ref 26.0–34.0)
MCHC: 30.1 g/dL (ref 30.0–36.0)
MCV: 101.3 fL — ABNORMAL HIGH (ref 80.0–100.0)
Platelets: 138 10*3/uL — ABNORMAL LOW (ref 150–400)
RBC: 2.26 MIL/uL — ABNORMAL LOW (ref 3.87–5.11)
RDW: 19.1 % — ABNORMAL HIGH (ref 11.5–15.5)
WBC: 5.3 10*3/uL (ref 4.0–10.5)
nRBC: 0 % (ref 0.0–0.2)

## 2020-02-16 LAB — PREPARE RBC (CROSSMATCH)

## 2020-02-16 MED ORDER — MIDODRINE HCL 5 MG PO TABS
10.0000 mg | ORAL_TABLET | Freq: Three times a day (TID) | ORAL | Status: DC
  Administered 2020-02-16 – 2020-02-21 (×12): 10 mg via ORAL
  Filled 2020-02-16 (×12): qty 2

## 2020-02-16 MED ORDER — DEXAMETHASONE SODIUM PHOSPHATE 4 MG/ML IJ SOLN: 4.0000 mg | Freq: Four times a day (QID) | INTRAMUSCULAR | Status: DC

## 2020-02-16 MED ORDER — SODIUM CHLORIDE 0.9% IV SOLUTION: Freq: Once | INTRAVENOUS | Status: DC

## 2020-02-16 MED ORDER — CALCITRIOL 0.25 MCG PO CAPS
0.2500 ug | ORAL_CAPSULE | ORAL | Status: DC
  Administered 2020-02-17: 0.25 ug via ORAL

## 2020-02-16 MED ORDER — UMECLIDINIUM BROMIDE 62.5 MCG/INH IN AEPB
1.0000 | INHALATION_SPRAY | Freq: Every day | RESPIRATORY_TRACT | Status: DC
  Administered 2020-02-16 – 2020-02-21 (×5): 1 via RESPIRATORY_TRACT
  Filled 2020-02-16: qty 7

## 2020-02-16 MED ORDER — SODIUM CHLORIDE 0.9 % IV SOLN: 100.0000 mL | INTRAVENOUS | Status: DC | PRN

## 2020-02-16 MED ORDER — CALCIUM ACETATE (PHOS BINDER) 667 MG PO CAPS
667.0000 mg | ORAL_CAPSULE | Freq: Three times a day (TID) | ORAL | Status: DC
  Administered 2020-02-16 – 2020-02-21 (×11): 667 mg via ORAL
  Filled 2020-02-16 (×12): qty 1

## 2020-02-16 MED ORDER — ALBUTEROL SULFATE HFA 108 (90 BASE) MCG/ACT IN AERS
2.0000 | INHALATION_SPRAY | Freq: Four times a day (QID) | RESPIRATORY_TRACT | Status: DC | PRN
  Administered 2020-02-16: 2 via RESPIRATORY_TRACT
  Filled 2020-02-16: qty 6.7

## 2020-02-16 MED ORDER — PANTOPRAZOLE SODIUM 40 MG PO TBEC
40.0000 mg | DELAYED_RELEASE_TABLET | Freq: Two times a day (BID) | ORAL | Status: DC
  Administered 2020-02-16 – 2020-02-21 (×10): 40 mg via ORAL
  Filled 2020-02-16 (×11): qty 1

## 2020-02-16 MED ORDER — LEVOTHYROXINE SODIUM 25 MCG PO TABS
125.0000 ug | ORAL_TABLET | Freq: Every day | ORAL | Status: DC
  Administered 2020-02-16 – 2020-02-21 (×6): 125 ug via ORAL
  Filled 2020-02-16 (×6): qty 1

## 2020-02-16 MED ORDER — DEXAMETHASONE SODIUM PHOSPHATE 4 MG/ML IJ SOLN
4.0000 mg | Freq: Four times a day (QID) | INTRAMUSCULAR | Status: DC
  Administered 2020-02-16 – 2020-02-17 (×5): 4 mg via INTRAVENOUS
  Filled 2020-02-16 (×5): qty 1

## 2020-02-16 MED ORDER — FLUTICASONE FUROATE-VILANTEROL 100-25 MCG/INH IN AEPB
1.0000 | INHALATION_SPRAY | Freq: Every day | RESPIRATORY_TRACT | Status: DC
  Administered 2020-02-16 – 2020-02-21 (×5): 1 via RESPIRATORY_TRACT
  Filled 2020-02-16: qty 28

## 2020-02-16 MED ORDER — PENTAFLUOROPROP-TETRAFLUOROETH EX AERO: 1.0000 "application " | INHALATION_SPRAY | CUTANEOUS | Status: DC | PRN

## 2020-02-16 MED ORDER — FLUTICASONE-UMECLIDIN-VILANT 100-62.5-25 MCG/INH IN AEPB: 1.0000 | INHALATION_SPRAY | Freq: Every day | RESPIRATORY_TRACT | Status: DC

## 2020-02-16 NOTE — Progress Notes (Signed)
[facility] Progress Note   Subjective: Awake, alert, oriented X 3. Says she is vaguely aware of mild HA this AM but isn't that bad. No specific complaints. Did not get HD yesterday D/T scheduling issues. HD this afternoon off schedule.     Objective Vitals:   02/15/20 1442 02/15/20 2102 02/16/20 0040 02/16/20 0415  BP: 138/69 (!) 152/69 116/65 124/68  Pulse: (!) 103 (!) 103 99 77  Resp: 14 17 14 18   Temp:  98.8 F (37.1 C) 98 F (36.7 C) 97.9 F (36.6 C)  TempSrc:  Oral  Oral  SpO2: 100% 100% 100% 100%  Weight:  51.1 kg  51.1 kg  Height:  5' 4"  (1.626 m)     General: Thin, elderly female no acute distress. Neuro: Alert, oriented X 3. Nonfocal.  Lungs: Clear bilaterally to auscultation without wheezes, rales, or rhonchi. Breathing is unlabored. Heart: RRR with S1 S2. No murmurs, rubs, or gallops appreciated. Abdomen: Soft, somewhat protuberant,  non-tender, non-distended with normoactive bowel sounds. No rebound/guarding. No obvious abdominal masses.. Lower extremities: 1+ pitting edema posterior calves, ankles Dialysis Access: L AVG + bruit  Additional Objective Labs: Basic Metabolic Panel: Recent Labs  Lab 02/15/20 1110 02/15/20 1410 02/16/20 0500  NA 136  --  138  K 3.6  --  4.1  CL 103  --  107  CO2 18*  --  14*  GLUCOSE 75  --  43*  BUN 40*  --  46*  CREATININE 9.30*  --  10.76*  CALCIUM 8.6*  --  8.5*  PHOS  --  8.2*  --    Liver Function Tests: Recent Labs  Lab 02/15/20 1110 02/16/20 0500  AST 18 20  ALT 14 16  ALKPHOS 41 44  BILITOT 0.8 1.2  PROT 6.4* 6.0*  ALBUMIN 3.2* 2.9*   No results for input(s): LIPASE, AMYLASE in the last 168 hours. CBC: Recent Labs  Lab 02/15/20 1110  WBC 6.3  HGB 7.1*  HCT 24.2*  MCV 103.9*  PLT 145*   Blood Culture    Component Value Date/Time   SDES BLOOD RIGHT ARM 04/11/2019 1340   SPECREQUEST  04/11/2019 1340    BOTTLES DRAWN AEROBIC AND ANAEROBIC Blood Culture results may not be optimal  due to an inadequate volume of blood received in culture bottles   CULT  04/11/2019 1340    NO GROWTH 5 DAYS Performed at [facility], [place] [address] [place], [place] [address]    REPTSTATUS 04/16/2019 FINAL 04/11/2019 1340    Cardiac Enzymes: No results for input(s): CKTOTAL, CKMB, CKMBINDEX, TROPONINI in the last 168 hours. CBG: Recent Labs  Lab 02/16/20 0641 02/16/20 0652 02/16/20 0710  GLUCAP 36* 55* 97   Iron Studies:  Recent Labs    02/15/20 1410  IRON 85  TIBC 270  FERRITIN 1,316*   @lablastinr3 @ Studies/Results: CT Head Wo Contrast  Result Date: 02/15/2020 CLINICAL DATA:  Headache.  Lung cancer. EXAM: CT HEAD WITHOUT CONTRAST TECHNIQUE: Contiguous axial images were obtained from the base of the skull through the vertex without intravenous contrast. COMPARISON:  MRI 12/01/2019 FINDINGS: Brain: There is an area of low-density noted in the right parietal subcortical white matter, new since prior MRI. Given history of lung cancer, cannot exclude metastasis in this region. No hemorrhage or hydrocephalus. Vascular: No hyperdense vessel or unexpected calcification. Skull: No acute calvarial abnormality. Sinuses/Orbits: Visualized paranasal sinuses and mastoids clear. Orbital soft tissues unremarkable. Other: None IMPRESSION: Area of subcortical low-density  in the right parietal lobe. Cannot exclude focal lesion/metastasis. Consider further evaluation with MRI with and without contrast. Electronically Signed   By: Rolm Baptise M.D.   On: 02/15/2020 11:42   MR BRAIN WO CONTRAST  Result Date: 02/16/2020 CLINICAL DATA:  Initial evaluation for possible brain mass. EXAM: MRI HEAD WITHOUT CONTRAST TECHNIQUE: Multiplanar, multiecho pulse sequences of the brain and surrounding structures were obtained without intravenous contrast. COMPARISON:  Comparison made with prior CT from earlier same day as well as recent brain MRI from 12/01/2019. FINDINGS: Brain: There has been interval  development of multifocal areas of T2/FLAIR hyperintensity involving both cerebral and cerebellar hemispheres, consistent with vasogenic edema related to new metastatic disease. These foci are predominantly positioned along the gray-white matter differentiation. Largest area within the left cerebral hemisphere seen at the anterior left frontal lobe and measures 18 mm (series 11, image 17). Largest area within the right cerebral hemisphere seen at the right temporal occipital junction and measures 2.3 cm (series 11, image 12). Largest infratentorial area seen within r the right cerebellum and measures 1.7 cm. No significant regional mass effect about any of these foci at this time. There are at least 10 lesions in total. Evaluation for underlying lesion size limited given lack of IV contrast, although 1 lesion positioned at the subcortical right parietal lobe measures 5 mm, measured well on T2 weighted sequence (series 10, image 16). Few scattered foci of susceptibility artifacts seen associated with these lesions, consistent with necrosis and/or hemorrhage. Underlying atrophy with chronic microvascular ischemic disease again noted. No evidence for acute or subacute infarct. Gray-white matter differentiation otherwise maintained. Ventricles within normal limits without hydrocephalus. No extra-axial fluid collection. Pituitary gland and suprasellar region within normal limits. Midline structures intact. Vascular: Major intracranial vascular flow voids are maintained. Skull and upper cervical spine: Craniocervical junction within normal limits. Bone marrow signal intensity normal. No focal marrow replacing lesion. No scalp soft tissue abnormality. Sinuses/Orbits: Patient status post bilateral ocular lens replacement. Paranasal sinuses are largely clear. No significant mastoid effusion. Other: None. IMPRESSION: Interval development of multifocal areas of T2/FLAIR hyperintensity involving both cerebral and cerebellar  hemispheres, consistent with vasogenic edema related to new metastatic disease. No significant regional mass effect at this time. Follow-up examination with postcontrast imaging could be performed for complete assessment as clinically warranted. Please note that the risk of NSF is considered to be negligible with the use of group II agents (Gadavist). Electronically Signed   By: Jeannine Boga M.D.   On: 02/16/2020 01:05   Medications: . sodium chloride    . sodium chloride     . [START ON 02/17/2020] calcitRIOL  0.25 mcg Oral Q M,W,F-HD  . calcium acetate  667 mg Oral TID WC  . Chlorhexidine Gluconate Cloth  6 each Topical Q0600  . dexamethasone (DECADRON) injection  4 mg Intravenous Q6H  . diltiazem  30 mg Oral Q8H  . fluticasone furoate-vilanterol  1 puff Inhalation Daily   And  . umeclidinium bromide  1 puff Inhalation Daily  . levothyroxine  125 mcg Oral Q0600  . metoprolol tartrate  25 mg Oral BID  . midodrine  10 mg Oral TID WC  . pantoprazole  40 mg Oral BID     Dialysis Orders: [facility] MWF 4 hrs 180NRe 300/800 49.5 kg 3.0 K/ 2.25 Ca UFP 3 -No Heparin  -Hectorol 3 mcg IV TIW -No ESA D/T Lung mass/possible malignancy.    Assessment/Plan: 1.  Severe Headache/Brain Metastases-MRI done last PM. Interval  development of multifocal areas of T2/FLAIR hyperintensity involving both cerebral and cerellar hemispheres consistent with vasogenic edema related to new metastatic disease. Primary source likely lung. Per primary. Oncology consulted.  2.  Acute on Chronic Anemia-denies overt blood loss but FOBT +. HGB 7.1 today. Last HGB at OP clinic 8.5 02/08/2020. Transfuse 1 unit PRBCs today in HD.  3.  ESRD -  MWF via AVG. HD today off schedule D/T scheduling issues . Short HD tomorrow to get back on schedule. Use 4.0 K bath, no heparin.  4.  Hypertension/volume  - Has not been getting to OP EDW, dropping BP toward end of treatment. BP controlled at present. BLE edema. Attempt UFG 2-3  liters as tolerated. Continue Midodrine 10 mg PO TIW.  5.  Anemia  - As noted above. H/O Crohn's disease  6.  Metabolic bone disease -C Ca OK. Continue VDRA, binders.  7.  Nutrition -Renal diet with fld restrictions. Albumin low, add protein supps, renal vits.  8.  COPD- per primary 9.  Chron's disease- per primary  Natasha Schneck H. Quinci Gavidia NP-C 02/16/2020, 10:19 AM  [facility] [phone]     Interval development of multifocal areas of T2/FLAIR hyperintensity involving both cerebral and cerebellar hemispheres, consistent with vasogenic edema related to new metastatic disease

## 2020-02-16 NOTE — Progress Notes (Signed)
Hypoglycemic Event  CBG: 36  Treatment: Apple juice 16 oz  Symptoms: asymptomatic  Follow-up CBG: Time: 0641 CBG Result: 36  Possible Reasons for Event: Pt w/ poor appetite  Comments/MD notified: Dr. Myna Hidalgo  Pt treatment intervention continuing until CBG =/>70. CBG at 0653: 55 Next CBG check due at [facility]

## 2020-02-16 NOTE — Progress Notes (Signed)
Patient off unit for HD

## 2020-02-16 NOTE — Progress Notes (Signed)
Patient back in room 3w-29 fom HD

## 2020-02-16 NOTE — Progress Notes (Signed)
[facility] PROGRESS NOTE   Natasha [name] HWT:[id number] DOB: 11-18-1945 DOA: 02/15/2020  PCP: [name], MD  Brief History/Interval Summary: 74 y.o. 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 fib presented to emergency department with worsening headache since 1 week.  She was admitted on 9/6 with acute GI bleeding and underwent EGD on 9/7 which shows diffuse mild inflammation characterized by congestion and erythema  in the entire examined stomach. A medium nonbleeding diverticulum in the gastric fundus. Duodenitis. Patient underwent colonoscopy 9/09;perianal fistula, perianal skin tag, non-patent endto side ileocolonic anastomosis, characterized by edema, erosion and ulceration.  Patient received 1 unit PRBC and Feraheme on previous admission.  Her Eliquis was resumed at the time of discharge.  Evaluation in the ED raise concern for brain mets.  Patient was hospitalized for further management.   Reason for Visit: Brain metastases.  Left lung mass  Consultants: Medical oncology.  Radiation oncology.  Pulmonology.  [facility]  Procedures: None yet  Antibiotics: Anti-infectives (From admission, onward)   None      Subjective: Patient denies any headaches currently.  No chest pain or shortness of breath.  Concerned about all of her abnormal findings.  Denies any nausea vomiting.   Assessment/Plan:  Brain metastases/headache Likely primary is lung.  She does have a left lung mass.  Medical oncology has been consulted.  They recommend consulting pulmonology and radiation oncology.  Both these services have been consulted.   Patient started on dexamethasone per medical oncology recommendation.  Patient is currently stable from a neurological standpoint.  Continue to monitor closely and wait for specialty input.  Lung mass/concern for primary lung cancer now with possible mets to the brain Patient found to have area of concern in the left lung.  Seen by pulmonology recently.  Bronchoscopy was discussed with the patient but she declined at that time.  Now considering new findings in her brain she will need tissue diagnosis.  Pulmonology has been consulted again.  Acute on chronic anemia/chronic GI blood loss She was occult blood positive.  Hemoglobin lower than what it was a few days ago.  No active bleeding noted.  Gastroenterology was consulted.  No plans for any studies at this time.  Eliquis has been placed on hold.  Transfusion ordered with hemodialysis.  ESRD on hemodialysis on Monday Wednesday Friday Could not be dialyzed yesterday.  Plan is for hemodialysis today.  Nephrology is following.  Chronic respiratory failure with hypoxia secondary to COPD She uses 3 to 4 L of oxygen at home.  Seems to be stable from a respiratory standpoint.  History of Crohn's disease She is status post surgery previously.  Recent EGD and colonoscopy as mentioned above under HPI.  Patient is on oral prednisone at home.  Currently on dexamethasone.  Also on Humira as outpatient.  Paroxysmal atrial fibrillation Continue Cardizem.  Holding Eliquis due to concern for bleed as well as possible need for procedure.  Hypothyroidism We will continue with the levothyroxine.  History of PE and DVT This appears to be remote.  Holding anticoagulation as discussed above.   DVT Prophylaxis: SCDs Code Status: DNR Family Communication: Discussed with the patient Disposition Plan: Hopefully return home when improved  Status is: Inpatient  Remains inpatient  appropriate because:Ongoing diagnostic testing needed not appropriate for  outpatient work up, IV treatments appropriate due to intensity of illness or inability to take PO and Inpatient level of care appropriate due to severity of illness   Dispo: The patient is from: Home              Anticipated d/c is to: Home              Anticipated d/c date is: 3 days              Patient currently is not medically stable to d/c.     Medications:  Scheduled: . Chlorhexidine Gluconate Cloth  6 each Topical Q0600  . dexamethasone (DECADRON) injection  4 mg Intravenous Q6H  . diltiazem  30 mg Oral Q8H  . metoprolol tartrate  25 mg Oral BID   Continuous: . sodium chloride    . sodium chloride     ZOX:WRUEAV chloride, sodium chloride, acetaminophen **OR** acetaminophen, hydrALAZINE, HYDROmorphone (DILAUDID) injection, lidocaine (PF), lidocaine-prilocaine, ondansetron **OR** ondansetron (ZOFRAN) IV, oxyCODONE, pentafluoroprop-tetrafluoroeth   Objective:  Vital Signs  Vitals:   02/15/20 1442 02/15/20 2102 02/16/20 0040 02/16/20 0415  BP: 138/69 (!) 152/69 116/65 124/68  Pulse: (!) 103 (!) 103 99 77  Resp: 14 17 14 18   Temp:  98.8 F (37.1 C) 98 F (36.7 C) 97.9 F (36.6 C)  TempSrc:  Oral  Oral  SpO2: 100% 100% 100% 100%  Weight:  51.1 kg  51.1 kg  Height:  5' 4"  (1.626 m)      Intake/Output Summary (Last 24 hours) at 02/16/2020 0907 Last data filed at 02/15/2020 2114 Gross per 24 hour  Intake 120 ml  Output --  Net 120 ml   Filed Weights   02/15/20 2102 02/16/20 0415  Weight: 51.1 kg 51.1 kg    General appearance: Awake alert.  In no distress Resp: Clear to auscultation bilaterally.  Normal effort Cardio: S1-S2 is normal regular.  No S3-S4.  No rubs murmurs or bruit GI: Abdomen is soft.  Nontender nondistended.  Bowel sounds are present normal.  No masses organomegaly Extremities: No edema.  Full range of motion of lower extremities. Neurologic: Alert and oriented x3.  No focal neurological deficits.    Lab Results:  Data Reviewed: I have  personally reviewed following labs and imaging studies  CBC: Recent Labs  Lab 02/15/20 1110  WBC 6.3  HGB 7.1*  HCT 24.2*  MCV 103.9*  PLT 145*    Basic Metabolic Panel: Recent Labs  Lab 02/15/20 1110 02/15/20 1410 02/16/20 0500  NA 136  --  138  K 3.6  --  4.1  CL 103  --  107  CO2 18*  --  14*  GLUCOSE 75  --  43*  BUN 40*  --  46*  CREATININE 9.30*  --  10.76*  CALCIUM 8.6*  --  8.5*  MG  --  1.6*  --   PHOS  --  8.2*  --     GFR: Estimated Creatinine Clearance: 3.8 mL/min (A) (by C-G formula based on SCr of 10.76 mg/dL (H)).  Liver Function Tests: Recent Labs  Lab 02/15/20 1110 02/16/20 0500  AST 18 20  ALT 14 16  ALKPHOS 41 44  BILITOT 0.8 1.2  PROT 6.4* 6.0*  ALBUMIN 3.2* 2.9*     Coagulation Profile: Recent Labs  Lab 02/15/20 1410  INR 1.1    CBG: Recent Labs  Lab 02/16/20 0641 02/16/20 0652 02/16/20 0710  GLUCAP 36* 55* 97    Anemia Panel: Recent Labs    02/15/20 1410  VITAMINB12 1,050*  FOLATE 7.2  FERRITIN 1,316*  TIBC 270  IRON 85    Recent Results (from the past 240 hour(s))  SARS Coronavirus 2 by RT PCR (hospital order, performed in [facility] hospital lab) Nasopharyngeal Nasopharyngeal Swab     Status: None   Collection Time: 02/15/20  2:10 PM   Specimen: Nasopharyngeal Swab  Result Value Ref Range Status   SARS Coronavirus 2 NEGATIVE NEGATIVE Final    Comment: (NOTE) SARS-CoV-2 target nucleic acids are NOT DETECTED.  The SARS-CoV-2 RNA is generally detectable in upper and lower respiratory specimens during the acute phase of infection. The lowest concentration of SARS-CoV-2 viral copies this assay can detect is 250 copies / mL. A negative result does not preclude SARS-CoV-2 infection and should not be used as the sole basis for treatment or other patient management decisions.  A negative result may occur with improper specimen collection / handling, submission of specimen other than nasopharyngeal swab,  presence of viral mutation(s) within the areas targeted by this assay, and inadequate number of viral copies (<250 copies / mL). A negative result must be combined with clinical observations, patient history, and epidemiological information.  Fact Sheet for Patients:   StrictlyIdeas.no  Fact Sheet for Healthcare Providers: BankingDealers.co.za  This test is not yet approved or  cleared by the [place] FDA and has been authorized for detection and/or diagnosis of SARS-CoV-2 by FDA under an Emergency Use Authorization (EUA).  This EUA will remain in effect (meaning this test can be used) for the duration of the COVID-19 declaration under Section 564(b)(1) of the Act, 21 U.S.C. section 360bbb-3(b)(1), unless the authorization is terminated or revoked sooner.  Performed at [facility], [place] [address] [place], [place] [address]       Radiology Studies: CT Head Wo Contrast  Result Date: 02/15/2020 CLINICAL DATA:  Headache.  Lung cancer. EXAM: CT HEAD WITHOUT CONTRAST TECHNIQUE: Contiguous axial images were obtained from the base of the skull through the vertex without intravenous contrast. COMPARISON:  MRI 12/01/2019 FINDINGS: Brain: There is an area of low-density noted in the right parietal subcortical white matter, new since prior MRI. Given history of lung cancer, cannot exclude metastasis in this region. No hemorrhage or hydrocephalus. Vascular: No hyperdense vessel or unexpected calcification. Skull: No acute calvarial abnormality. Sinuses/Orbits: Visualized paranasal sinuses and mastoids clear. Orbital soft tissues unremarkable. Other: None IMPRESSION: Area of subcortical low-density in the right parietal lobe. Cannot exclude focal lesion/metastasis. Consider further evaluation with MRI with and without contrast. Electronically Signed   By: Rolm Baptise M.D.   On: 02/15/2020 11:42   MR BRAIN WO CONTRAST  Result Date:  02/16/2020 CLINICAL DATA:  Initial evaluation for possible brain mass. EXAM: MRI HEAD WITHOUT CONTRAST TECHNIQUE: Multiplanar, multiecho pulse sequences of the brain and surrounding structures were obtained without intravenous contrast. COMPARISON:  Comparison made with prior CT from earlier same day as well as recent brain MRI from 12/01/2019. FINDINGS: Brain: There has been interval development of multifocal areas of T2/FLAIR hyperintensity involving both cerebral and cerebellar hemispheres, consistent with vasogenic edema related to new metastatic disease. These foci are predominantly positioned along the gray-white matter differentiation. Largest area within the left cerebral hemisphere seen at the anterior left frontal lobe and measures 18 mm (series 11, image 17). Largest area within the right cerebral hemisphere seen at the right temporal occipital junction and  measures 2.3 cm (series 11, image 12). Largest infratentorial area seen within r the right cerebellum and measures 1.7 cm. No significant regional mass effect about any of these foci at this time. There are at least 10 lesions in total. Evaluation for underlying lesion size limited given lack of IV contrast, although 1 lesion positioned at the subcortical right parietal lobe measures 5 mm, measured well on T2 weighted sequence (series 10, image 16). Few scattered foci of susceptibility artifacts seen associated with these lesions, consistent with necrosis and/or hemorrhage. Underlying atrophy with chronic microvascular ischemic disease again noted. No evidence for acute or subacute infarct. Gray-white matter differentiation otherwise maintained. Ventricles within normal limits without hydrocephalus. No extra-axial fluid collection. Pituitary gland and suprasellar region within normal limits. Midline structures intact. Vascular: Major intracranial vascular flow voids are maintained. Skull and upper cervical spine: Craniocervical junction within normal  limits. Bone marrow signal intensity normal. No focal marrow replacing lesion. No scalp soft tissue abnormality. Sinuses/Orbits: Patient status post bilateral ocular lens replacement. Paranasal sinuses are largely clear. No significant mastoid effusion. Other: None. IMPRESSION: Interval development of multifocal areas of T2/FLAIR hyperintensity involving both cerebral and cerebellar hemispheres, consistent with vasogenic edema related to new metastatic disease. No significant regional mass effect at this time. Follow-up examination with postcontrast imaging could be performed for complete assessment as clinically warranted. Please note that the risk of NSF is considered to be negligible with the use of group II agents (Gadavist). Electronically Signed   By: Jeannine Boga M.D.   On: 02/16/2020 01:05       LOS: 1 day   [facility] Pager on [email or web address]  02/16/2020, 9:07 AM

## 2020-02-16 NOTE — Plan of Care (Signed)

## 2020-02-16 NOTE — Consult Note (Signed)
NAME:  Natasha PINCOCK, MRN:  [id number], DOB:  [birth year], LOS: 1 ADMISSION DATE:  02/15/2020, CONSULTATION DATE:  02/16/2020 REFERRING MD:  Dr. Maryland Pink, CHIEF COMPLAINT:  Lung cancer  Brief History   74yo female presented with reported worsening headache that began one week prior to admission. Workup during hospitalization with brain MRI revealed new brain metastases, primary thought to be lung. PCCM consult for pulmonary eval.   History of present illness   Natasha [name] is a 74 y.o. 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 Patient stated headache began one week prior to admission. The pain was rated 10/10 at its worst and described as a constant throbbing pain that radiated to her right shoulder. She report no aggravating or alleviating factors.   On chart review it is seen that patient was first diagnosed with stage IV lung cancer June of 2021. PET scan was completed 11/01/2019 which showed left upper mass measuring 2.3 x 1.9cm. and metastatic osseous lesions to T10/T11. Patient has been seen in our pulmonary clinic 7/14/20210 and deemed to high risk for bronchoscopy for tissue sampling given her underlying severe COPD and underlying emphysema (FEV1 35%). The decision at this time was for patient to undergo consult for CT guided biopsy of thoracic metastatic ostial lesion.    Patient underwent CT guided core biopsy of 10T lesion by IR and this revealed atypical lymphoid infiltrations with no evidence of carcinoma. In collaboration with oncology decision was made to purse bronchoscopy for tissue sampling and this was scheduled with Dr. Lamonte Sakai but patient declined procedure at this time.   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 Cannot exclude focal lesion/metastasis. Consider further evaluation with MRI with and without contrast.  MRI brain 9/22 > Interval development of multifocal areas of T2/FLAIR hyperintensity involving both cerebral and cerebellar hemispheres, consistent with vasogenic edema related to new metastatic disease. No significant regional mass effect at this time. Follow-up examination with postcontrast imaging could be performed for complete assessment as clinically warranted. Please note that the risk of NSF is considered to be negligible with the use of group II agents (Gadavist).  Micro Data:  COVID 9/22 > Negative   Antimicrobials:     Interim history/subjective:  Sitting up in bed in no acute distress, states she is tired and concerned regarding worsening breathing.  Objective   Blood pressure 124/68, pulse 77, temperature 97.9 F (36.6 C), temperature source Oral, resp. rate 18, height 5' 4"  (1.626 m), weight 51.1 kg, SpO2 100 %.        Intake/Output Summary (Last 24 hours) at 02/16/2020 0952 Last data filed at 02/15/2020 2114 Gross per 24 hour  Intake 120 ml  Output --  Net 120 ml   Filed Weights   02/15/20 2102 02/16/20 0415  Weight: 51.1 kg 51.1 kg    Examination: General: Chronically ill appearing elderly thin elderly female lying in bed in NAD HEENT: /AT, MM pink/moist, PERRL,  Neuro: Alert and oriented x3, non-focal  CV: s1s2 regular rate and rhythm, no murmur, rubs, or  gallops,  PULM:  Diminished bilaterally, fiant expiratory wheeze, no increased work of breathing GI: soft, bowel sounds active in all 4 quadrants, non-tender, non-distended Extremities: warm/dry, no edema  Skin: no rashes or lesions   Resolved Hospital Problem list     Assessment & Plan:  Stage III/IV lung cancer   -Tissue sampling is pending  Severe COPD with underlying emphysema  -GOLD stage III-IV; PFTs in November 2020 showed FEV1 0.57 (35%), ratio 37. -Home medications include Trelegy, PRN albuterol,  Chronic hypoxic respiratory failure  -Baseline 3L [place] continuously  Plan: Long discussion held with patient at bedside regarding plan of care and all options available. [name] understands her current diagnoses, including severe COPD and metastatic lung cancer, and the prognosis of each. She reports her breathing has progressively decline and is very worried about a bronchoscopy to obtain tissue sample. She would like to discuss her options with her husband and children before a decision is made. [name] stated her primary goal is that "I not suffer"   Await decision on need for bronch  Continue bronchodilators Pain control  Continue supplemental oxygen Seizure precautions  Palliative care consulted    Rest managed per primary team    Best practice:  Diet: Renal  Pain/Anxiety/Delirium protocol (if indicated): PRN VAP protocol (if indicated): N/A DVT prophylaxis: SCD GI prophylaxis: PPI Glucose control: Monitor Mobility: Up with assistance  Code Status: DNR Family Communication: Updated patient  Disposition:   Labs   CBC: Recent Labs  Lab 02/15/20 1110  WBC 6.3  HGB 7.1*  HCT 24.2*  MCV 103.9*  PLT 145*    Basic Metabolic Panel: Recent Labs  Lab 02/15/20 1110 02/15/20 1410 02/16/20 0500  NA 136  --  138  K 3.6  --  4.1  CL 103  --  107  CO2 18*  --  14*  GLUCOSE 75  --  43*  BUN 40*  --  46*  CREATININE 9.30*  --  10.76*  CALCIUM 8.6*  --  8.5*  MG  --  1.6*  --   PHOS  --  8.2*  --    GFR: Estimated Creatinine Clearance: 3.8 mL/min (A) (by C-G formula based on SCr of 10.76 mg/dL (H)). Recent Labs  Lab 02/15/20 1110  WBC 6.3    Liver Function Tests: Recent Labs  Lab 02/15/20 1110 02/16/20 0500  AST 18 20  ALT 14 16  ALKPHOS 41 44   BILITOT 0.8 1.2  PROT 6.4* 6.0*  ALBUMIN 3.2* 2.9*   No results for input(s): LIPASE, AMYLASE in the last 168 hours. No results for input(s): AMMONIA in the last 168 hours.  ABG    Component Value Date/Time   PHART 7.350 04/15/2019 1436   PCO2ART 40.6 04/15/2019 1436   PO2ART 121 (H) 04/15/2019 1436   HCO3 21.2 01/30/2020 1810   TCO2 21 (L) 01/30/2020 1811   ACIDBASEDEF 6.0 (H) 01/30/2020 1810   O2SAT 92.0 01/30/2020 1810     Coagulation Profile: Recent Labs  Lab 02/15/20 1410  INR 1.1    Cardiac Enzymes: No results for input(s): CKTOTAL, CKMB, CKMBINDEX, TROPONINI in the last 168 hours.  HbA1C: Hgb A1c MFr Bld  Date/Time Value Ref Range Status  11/18/2019 05:03 PM 5.0 4.8 - 5.6 % Final    Comment:    (NOTE) Pre diabetes:          5.7%-6.4%  Diabetes:              >6.4%  Glycemic control for   <7.0% adults with diabetes     CBG: Recent Labs  Lab 02/16/20 0641 02/16/20 0652 02/16/20 0710  GLUCAP 36* 55* 97    Review of Systems: Positive in bold   Gen: Denies fever, chills, weight change, fatigue, night sweats HEENT: Denies blurred vision, double vision, hearing loss, tinnitus, sinus congestion, rhinorrhea, sore throat, neck stiffness, dysphagia PULM: Denies shortness of breath, cough, sputum production, hemoptysis, wheezing CV: Denies chest pain, edema, orthopnea, paroxysmal nocturnal dyspnea, palpitations GI: Denies abdominal pain, nausea, vomiting, diarrhea, hematochezia, melena, constipation, change in bowel habits GU: Denies dysuria, hematuria, polyuria, oliguria, urethral discharge Endocrine: Denies hot or cold intolerance, polyuria, polyphagia or appetite change Derm: Denies rash, dry skin, scaling or peeling skin change Heme: Denies easy bruising, bleeding, bleeding gums Neuro: Denies headache, numbness, weakness, slurred speech, loss of memory or consciousness   Past Medical History  She,  has a past medical history of Anal fistula, Anemia  in chronic kidney disease (CKD), Aortic insufficiency, Arthritis, Borderline hypertension, Bulging disc, Chronic diarrhea, CKD (chronic kidney disease), stage IV (HCC), Crohn's disease ([place]), Dyspnea, Emphysema/COPD (HCC), GERD (gastroesophageal reflux disease), History of blood transfusion, History of glaucoma, History of kidney stones, History of pancreatitis, History of small bowel obstruction, Hypertension, Hypothyroidism, postsurgical, Osteoporosis, PAF (paroxysmal atrial fibrillation) ([place]), Perianal Crohn's disease ([place]), Peripheral vascular disease ([place]), Polyarthralgia, Seizures ([place]), and Wears partial dentures.   Surgical History    Past Surgical History:  Procedure Laterality Date  . ABDOMINAL HYSTERECTOMY  1990   and  Appendectomy  . AV FISTULA PLACEMENT Left 12/08/2017   Procedure: INSERTION OF ARTERIOVENOUS (AV) GRAFT WITH ARTEGRAFT TO LEFT UPPER ARM;  Surgeon: Conrad Burchard, MD;  Location: [place];  Service: Vascular;  Laterality: Left;  . BASCILIC VEIN TRANSPOSITION Left 01/09/2017   Procedure: LEFT 1ST STAGE BRACHIAL VEIN TRANSPOSITION;  Surgeon: Conrad Plymouth, MD;  Location: [place];  Service: Vascular;  Laterality: Left;  . BASCILIC VEIN TRANSPOSITION Left 06/16/2017   Procedure: Second Stage BASILIC VEIN TRANSPOSITION  LEFT ARM;  Surgeon: Conrad , MD;  Location: [place];  Service: Vascular;  Laterality: Left;  . BIOPSY  03/09/2018   Procedure: BIOPSY;  Surgeon: Wilford Corner, MD;  Location: [facility];  Service: Endoscopy;;  . BIOPSY  01/31/2020   Procedure: BIOPSY;  Surgeon: Otis Brace, MD;  Location: [place];  Service: Gastroenterology;;  . BIOPSY  02/02/2020   Procedure: BIOPSY;  Surgeon: Otis Brace, MD;  Location: [place];  Service: Gastroenterology;;  . CHOLECYSTECTOMY    . COLON RESECTION  x3 --  1978,  1987, 1989   ILEAL RESECTION x2/   [place]  . COLONOSCOPY WITH PROPOFOL N/A 09/25/2014   Procedure: COLONOSCOPY WITH PROPOFOL;   Surgeon: Garlan Fair, MD;  Location: [facility];  Service: Endoscopy;  Laterality: N/A;  . COLONOSCOPY WITH PROPOFOL N/A 03/09/2018   Procedure: COLONOSCOPY WITH PROPOFOL;  Surgeon: Wilford Corner, MD;  [facility];  Service: Endoscopy;  Laterality: N/A;  . COLONOSCOPY WITH PROPOFOL N/A 02/02/2020   Procedure: COLONOSCOPY WITH PROPOFOL;  Surgeon: Otis Brace, MD;  Location: [place];  Service: Gastroenterology;  Laterality: N/A;  . CYSTOSCOPY W/ URETERAL STENT PLACEMENT Right 11/19/2016   Procedure: CYSTOSCOPY WITH RIGHT RETROGRADE PYELOGRAM/ URETEROSCOPY WITH LASER AND RIGHT URETERAL STENT PLACEMENT;  Surgeon: Ardis Hughs, MD;  [facility]: [facility];  Service: Urology;  Laterality: Right;  . ESOPHAGOGASTRODUODENOSCOPY  03/04/2012   Procedure: ESOPHAGOGASTRODUODENOSCOPY (EGD);  Surgeon: Garlan Fair,  MD;  [facility];  Service: Endoscopy;  Laterality: N/A;  . ESOPHAGOGASTRODUODENOSCOPY (EGD) WITH PROPOFOL N/A 01/31/2020   Procedure: ESOPHAGOGASTRODUODENOSCOPY (EGD) WITH PROPOFOL;  Surgeon: Otis Brace, MD;  [facility];  Service: Gastroenterology;  Laterality: N/A;  . EXAMINATION UNDER ANESTHESIA N/A 09/12/2014   Procedure: EXAM UNDER ANESTHESIA;  Surgeon: Rolm Bookbinder, MD;  Location: [place];  Service: General;  Laterality: N/A;  . EYE SURGERY     laser  . FLEXIBLE SIGMOIDOSCOPY  03/04/2012   Procedure: FLEXIBLE SIGMOIDOSCOPY;  Surgeon: Garlan Fair, MD;  [facility];  Service: Endoscopy;  Laterality: N/A;  . GLAUCOMA SURGERY Bilateral   . HOLMIUM LASER APPLICATION Right 2/95/2841   Procedure: HOLMIUM LASER APPLICATION;  Surgeon: Ardis Hughs, MD;  Location: [facility];  Service: Urology;  Laterality: Right;  . INCISION AND DRAINAGE PERIRECTAL ABSCESS N/A 09/12/2014   Procedure: IRRIGATION AND DEBRIDEMENT PERIRECTAL ABSCESS;  Surgeon: Rolm Bookbinder, MD;  Location: [place];  Service: General;  Laterality: N/A;  .  IR FLUORO GUIDE CV LINE RIGHT  03/20/2017  . IR US GUIDE VASC ACCESS RIGHT  03/20/2017  . NEGATIVE SLEEP STUDY  2008  . PLACEMENT OF SETON N/A 12/01/2014   Procedure: PLACEMENT OF SETON;  Surgeon: [name], MD;  Location: [facility];  Service: General;  Laterality: N/A;  . POLYPECTOMY  03/09/2018   Procedure: POLYPECTOMY;  Surgeon: Wilford Corner, MD;  [facility];  Service: Endoscopy;;  . POLYPECTOMY  02/02/2020   Procedure: POLYPECTOMY;  Surgeon: Otis Brace, MD;  Location: [facility];  Service: Gastroenterology;;  . RECTAL EXAM UNDER ANESTHESIA N/A 12/01/2014   Procedure: RECTAL EXAM UNDER ANESTHESIA;  Surgeon: [name], MD;  Location: [facility];  Service: General;  Laterality: N/A;  . TOTAL THYROIDECTOMY  10-13-2003  . TRANSTHORACIC ECHOCARDIOGRAM  07-11-2013   mild LVH,  ef 55%,  moderate AR,  mild MR and TR,  trivial PR  . TUBAL LIGATION       Social History   reports that she quit smoking about 2 years ago. Her smoking use included cigarettes. She started smoking about 53 years ago. She has a 35.00 pack-year smoking history. She has never used smokeless tobacco. She reports that she does not drink alcohol and does not use drugs.   Family History   Her family history includes COPD in her mother; Colon cancer in her brother; Diabetes in her brother; Diverticulitis in her mother; Heart attack in her brother, father, and mother; Heart disease in her father and mother; Hypertension in her father and mother; Thyroid disease in her brother.   Allergies Allergies  Allergen Reactions  . Mercaptopurine Other (See Comments)    Caused pancreatitis  . Remicade [Infliximab] Other (See Comments)    CAUSED JOINT PAIN     Home Medications  Prior to Admission medications   Medication Sig Start Date End Date Taking? Authorizing Provider  acetaminophen (TYLENOL) 500 MG tablet Take 1,000 mg by mouth every 6 (six) hours as needed for mild  pain.    Yes [provider]  Adalimumab (HUMIRA) 40 MG/0.8ML PSKT Inject 40 mg into the skin every 14 (fourteen) days.    Yes [provider]  albuterol (VENTOLIN HFA) 108 (90 Base) MCG/ACT inhaler INHALE 2 PUFFS BY MOUTH EVERY 6 HOURS IF NEEDED FOR WHEEZING OR SHORTNESS OF BREATH Patient taking differently: Inhale 2 puffs into the lungs every 6 (six) hours as needed for wheezing or shortness of breath. INHALE 2  PUFFS BY MOUTH EVERY 6 HOURS IF NEEDED FOR WHEEZING OR SHORTNESS OF BREATH 05/31/19  Yes Chesley Mires, MD  calcitRIOL (ROCALTROL) 0.25 MCG capsule Take 1 capsule (0.25 mcg total) by mouth every Monday, Wednesday, and Friday with hemodialysis. 02/03/20  Yes Regalado, Belkys A, MD  calcium acetate (PHOSLO) 667 MG capsule Take 1 capsule (667 mg total) by mouth 3 (three) times daily with meals. 02/03/20  Yes Regalado, Belkys A, MD  calcium-vitamin D (OSCAL WITH D) 500-200 MG-UNIT TABS tablet Take 1 tablet by mouth as needed (with dialysis). 02/03/20  Yes Regalado, Belkys A, MD  cyanocobalamin (,VITAMIN B-12,) 1000 MCG/ML injection Inject 1,000 mcg into the muscle every 30 (thirty) days.    Yes [provider]  diltiazem (CARDIZEM) 30 MG tablet Take 1 tablet (30 mg total) by mouth every 8 (eight) hours. 01/24/20  Yes Nahser, Wonda Cheng, MD  ELIQUIS 5 MG TABS tablet Take 2.5 mg by mouth 2 (two) times daily. Takes 1/2 tablet (2.43m) bid 08/23/18  Yes [provider]  fluticasone (FLONASE) 50 MCG/ACT nasal spray Place 1 spray into both nostrils daily as needed for allergies.  03/19/19  Yes [provider]  Fluticasone-Umeclidin-Vilant (TRELEGY ELLIPTA) 100-62.5-25 MCG/INH AEPB Inhale 1 puff into the lungs daily. 01/17/20  Yes WMartyn Ehrich NP  ipratropium-albuterol (DUONEB) 0.5-2.5 (3) MG/3ML SOLN Take 3 mLs by nebulization every 6 (six) hours as needed. Patient taking differently: Take 3 mLs by nebulization every 6 (six) hours as needed (sob/wheezing).  12/30/18   Yes NFenton Foy NP  levothyroxine (SYNTHROID, LEVOTHROID) 125 MCG tablet Take 125 mcg by mouth daily before breakfast.    Yes [provider]  lidocaine-prilocaine (EMLA) cream Apply 1 application topically every Monday, Wednesday, and Friday.  11/29/18  Yes [provider]  loperamide (IMODIUM) 2 MG capsule Take 2 capsules (4 mg total) by mouth as needed for diarrhea or loose stools. 03/27/17  Yes GCaren Griffins MD  metoprolol tartrate (LOPRESSOR) 25 MG tablet Take 25 mg by mouth 2 (two) times daily. 02/10/20  Yes [provider]  midodrine (PROAMATINE) 10 MG tablet Take 1 tablet (10 mg total) by mouth 3 (three) times daily with meals. 01/24/20  Yes Nahser, PWonda Cheng MD  nitroGLYCERIN (NITROSTAT) 0.4 MG SL tablet Place 1 tablet (0.4 mg total) under the tongue every 5 (five) minutes as needed for chest pain. 05/03/19 02/15/20 Yes Nahser, PWonda Cheng MD  ondansetron (ZOFRAN ODT) 4 MG disintegrating tablet Take 1 tablet (4 mg total) by mouth every 8 (eight) hours as needed for nausea or vomiting. 02/03/20  Yes Regalado, Belkys A, MD  OXYGEN Inhale 2-3 L into the lungs See admin instructions. Use every night and during the day as needed for shortness of breath   Yes [provider]  pantoprazole (PROTONIX) 40 MG tablet Take 1 tablet (40 mg total) by mouth 2 (two) times daily. 02/03/20 03/04/20 Yes Regalado, Belkys A, MD  predniSONE (DELTASONE) 5 MG tablet Take 5 mg by mouth daily with breakfast.   Yes [provider]  Respiratory Therapy Supplies (NEBULIZER) DEVI 1 Device by Does not apply route as needed. 12/07/18   NFenton Foy NP     Signature:   WJohnsie Cancel NP-C [facility] Contact / Pager information can be found on Amion  02/16/2020, 11:09 AM

## 2020-02-16 NOTE — Consult Note (Addendum)
Radiation Oncology         [phone] ________________________________  Initial inpatient Consultation- Conducted via telephone due to current COVID-19 concerns for limiting patient exposure  Name: ZYKIRA MATLACK MRN: [id number]  Date of Service: 02/15/2020 DOB: 05/02/1946  GN:[id number], Ronie Spies, MD  No ref. provider found   REFERRING PHYSICIAN: Dr. Bonnielee Haff  DIAGNOSIS: 74 y.o. female with newly diagnosed brain metastases felt most likely secondary to primary bronchogenic neoplasm but tissue confirmation has not yet been obtained.    ICD-10-CM   1. Brain lesion  G93.9   2. Low hemoglobin  D64.9   3. Stool guaiac positive  R19.5   4. Metastasis to brain [facility])  C79.31     HISTORY OF PRESENT ILLNESS: DESANI SPRUNG is a 74 y.o. female seen at the request of Dr. Maryland Pink. She has a longstanding history of COPD/emphysema followed in pulmonology with Dr. Halford Chessman and has been known to have a small, stable pulmonary nodule in the left upper lobe since at least 2017. She is an ex-smoker, quit 5 years ago. On follow-up CT chest 10/18/2019 she was noted to have enlargement of the left upper lobe pulmonary nodule from 64m to 2 cm currently, as well as a new, more peripheral nodule in the left upper lobe measuring 1.4 cm and a 2 cm right lower lobe nodule concerning for metastatic disease versus synchronous primary bronchogenic neoplasms. This was further evaluated with PET scan on 11/01/2019 which showed hypermetabolism in the 2.3 cm left upper lobe nodule, low SUV in the peripheral left upper lobe nodule and a decrease in size of the right lower lobe nodule, favoring an inflammatory/infectious process on the right. There was also hypermetabolism in the left suprahilar and hilar regions as well as a solitary osseous metastasis in the vertebral body of T11.  She was seen by Dr. MEarlie Serveras an outpatient on 11/15/2019 with recommendations to proceed with bronchoscopy and EBUS for biopsy of the left  upper lobe lung mass and hilar lymphadenopathy.  After discussion with pulmonology, she was felt to be too high risk for bronchoscopy and the decision was made to instead, proceed with biopsy of the osseous lesion at T10/T11.  In the interim, she presented to the emergency department on 11/18/2019 with complaints of abdominal pain and diarrhea.  During admission, she had a CT C/A/P which showed enlargement of the hilar component and persistent left upper lobe nodules but no evidence of disease in the abdomen or pelvis.  She was discharged home on 11/20/2019.  She had an outpatient CT-guided biopsy of the T10 lesion on 11/29/2019 with Dr. MLaurence Ferrari  Final pathology was nondiagnostic with atypical lymphoid infiltrates.  She had an MRI of the brain on 12/01/2019 which was without evidence of metastatic disease.  Given the inconclusive results from the bone biopsy, her case was presented in the multidisciplinary thoracic oncology conference and consensus recommendation was to proceed with bronchoscopy/EBUS but the patient refused the procedure.  She presented back to the emergency department on 01/30/2020 with complaints of weakness and shortness of breath.  Chest x-ray revealed enlarging left upper lobe nodule.  She had a colonoscopy during admission for acute blood loss anemia and was noted to have perianal fistula, perianal skin tag, non-patent endto side ileocolonic anastomosis, characterized by edema, erosion and ulceration and was positive for H pylori. She received 1 unit PRBC and Feraheme and was discharged home on 02/03/2020.  She presented back to the emergency department on 02/15/2020 with severe headache  x1 week.  CT head on admission showed suspicious subcortical low-density lesion in the right parietal lobe.  A noncontrast brain MRI was performed for further evaluation confirming at least 10 brain metastases in total with the largest measuring 1.8 cm in the anterior left frontal lobe, 2.3 cm in the right  temporal occipital region and 1.7 cm in the right cerebellum.  Pulmonology has been reconsulted to further discuss bronchoscopy for biopsy and tissue confirmation versus possible CT-guided lung biopsy with interventional radiology in the near future.  We have been asked to consult for discussion of potential radiation treatment options for management of the metastatic disease in the brain.  PREVIOUS RADIATION THERAPY: No  PAST MEDICAL HISTORY:  Past Medical History:  Diagnosis Date  . Anal fistula   . Anemia in chronic kidney disease (CKD)   . Aortic insufficiency    Echo 3/18: mod conc LVH, EF 60-65, no RWMA, Gr 1 DD, mod AI, mild MR, normal RVSF, Trivial TR  . Arthritis   . Borderline hypertension   . Bulging disc    L3-L4  . Chronic diarrhea    due to crohn's  . CKD (chronic kidney disease), stage IV ([place])    MWF- [address]  . Crohn's disease ([place])    chronic ileitis  . Dyspnea    with exertion  . Emphysema/COPD ([place])   . GERD (gastroesophageal reflux disease)    denies  . History of blood transfusion   . History of glaucoma   . History of kidney stones   . History of pancreatitis    2008--  mercaptopurine  . History of small bowel obstruction    12-03-2010  due to crohn's ileitis  . Hypertension    no longer on medications  . Hypothyroidism, postsurgical    multinodule w/ hurthle cells  . Osteoporosis   . PAF (paroxysmal atrial fibrillation) ([place])   . Perianal Crohn's disease ([place])   . Peripheral vascular disease ([place])    blood clot behind knee left leg  . Polyarthralgia   . Seizures ([place])    03/2017  . Wears partial dentures    upper      PAST SURGICAL HISTORY: Past Surgical History:  Procedure Laterality Date  . ABDOMINAL HYSTERECTOMY  1990   and  Appendectomy  . AV FISTULA PLACEMENT Left 12/08/2017   Procedure: INSERTION OF ARTERIOVENOUS (AV) GRAFT WITH ARTEGRAFT TO LEFT UPPER ARM;  Surgeon: Conrad Higgins, MD;  Location: [place];  Service: Vascular;   Laterality: Left;  . BASCILIC VEIN TRANSPOSITION Left 01/09/2017   Procedure: LEFT 1ST STAGE BRACHIAL VEIN TRANSPOSITION;  Surgeon: Conrad Fairview, MD;  Location: [place];  Service: Vascular;  Laterality: Left;  . BASCILIC VEIN TRANSPOSITION Left 06/16/2017   Procedure: Second Stage BASILIC VEIN TRANSPOSITION  LEFT ARM;  Surgeon: Conrad Biscay, MD;  Location: [place];  Service: Vascular;  Laterality: Left;  . BIOPSY  03/09/2018   Procedure: BIOPSY;  Surgeon: Wilford Corner, MD;  [facility];  Service: Endoscopy;;  . BIOPSY  01/31/2020   Procedure: BIOPSY;  Surgeon: Otis Brace, MD;  Location: [place];  Service: Gastroenterology;;  . BIOPSY  02/02/2020   Procedure: BIOPSY;  Surgeon: Otis Brace, MD;  Location: [place];  Service: Gastroenterology;;  . CHOLECYSTECTOMY    . COLON RESECTION  x3 --  1978,  1987, 1989   ILEAL RESECTION x2/   [place]  . COLONOSCOPY WITH PROPOFOL N/A 09/25/2014   Procedure: COLONOSCOPY WITH PROPOFOL;  Surgeon: Garlan Fair, MD;  [facility];  Service: Endoscopy;  Laterality: N/A;  . COLONOSCOPY WITH PROPOFOL N/A 03/09/2018   Procedure: COLONOSCOPY WITH PROPOFOL;  Surgeon: Wilford Corner, MD;  [facility];  Service: Endoscopy;  Laterality: N/A;  . COLONOSCOPY WITH PROPOFOL N/A 02/02/2020   Procedure: COLONOSCOPY WITH PROPOFOL;  Surgeon: Otis Brace, MD;  Location: [place];  Service: Gastroenterology;  Laterality: N/A;  . CYSTOSCOPY W/ URETERAL STENT PLACEMENT Right 11/19/2016   Procedure: CYSTOSCOPY WITH RIGHT RETROGRADE PYELOGRAM/ URETEROSCOPY WITH LASER AND RIGHT URETERAL STENT PLACEMENT;  Surgeon: Ardis Hughs, MD;  [facility];  Service: Urology;  Laterality: Right;  . ESOPHAGOGASTRODUODENOSCOPY  03/04/2012   Procedure: ESOPHAGOGASTRODUODENOSCOPY (EGD);  Surgeon: Garlan Fair, MD;  [facility];  Service: Endoscopy;  Laterality: N/A;  . ESOPHAGOGASTRODUODENOSCOPY  (EGD) WITH PROPOFOL N/A 01/31/2020   Procedure: ESOPHAGOGASTRODUODENOSCOPY (EGD) WITH PROPOFOL;  Surgeon: Otis Brace, MD;  [facility];  Service: Gastroenterology;  Laterality: N/A;  . EXAMINATION UNDER ANESTHESIA N/A 09/12/2014   Procedure: EXAM UNDER ANESTHESIA;  Surgeon: Rolm Bookbinder, MD;  Location: [place];  Service: General;  Laterality: N/A;  . EYE SURGERY     laser  . FLEXIBLE SIGMOIDOSCOPY  03/04/2012   Procedure: FLEXIBLE SIGMOIDOSCOPY;  Surgeon: Garlan Fair, MD;  [facility];  Service: Endoscopy;  Laterality: N/A;  . GLAUCOMA SURGERY Bilateral   . HOLMIUM LASER APPLICATION Right 11/20/3660   Procedure: HOLMIUM LASER APPLICATION;  Surgeon: Ardis Hughs, MD;  [facility];  Service: Urology;  Laterality: Right;  . INCISION AND DRAINAGE PERIRECTAL ABSCESS N/A 09/12/2014   Procedure: IRRIGATION AND DEBRIDEMENT PERIRECTAL ABSCESS;  Surgeon: Rolm Bookbinder, MD;  Location: [place];  Service: General;  Laterality: N/A;  . IR FLUORO GUIDE CV LINE RIGHT  03/20/2017  . IR US GUIDE VASC ACCESS RIGHT  03/20/2017  . NEGATIVE SLEEP STUDY  2008  . PLACEMENT OF SETON N/A 12/01/2014   Procedure: PLACEMENT OF SETON;  Surgeon: [name], MD;  Location: [facility];  Service: General;  Laterality: N/A;  . POLYPECTOMY  03/09/2018   Procedure: POLYPECTOMY;  Surgeon: Wilford Corner, MD;  [facility];  Service: Endoscopy;;  . POLYPECTOMY  02/02/2020   Procedure: POLYPECTOMY;  Surgeon: Otis Brace, MD;  Location: [place] [facility];  Service: Gastroenterology;;  . RECTAL EXAM UNDER ANESTHESIA N/A 12/01/2014   Procedure: RECTAL EXAM UNDER ANESTHESIA;  Surgeon: [name], MD;  Location: [facility];  Service: General;  Laterality: N/A;  . TOTAL THYROIDECTOMY  10-13-2003  . TRANSTHORACIC ECHOCARDIOGRAM  07-11-2013   mild LVH,  ef 55%,  moderate AR,  mild MR and TR,  trivial PR  . TUBAL LIGATION      FAMILY HISTORY:    Family History  Problem Relation Age of Onset  . Hypertension Father   . Heart disease Father   . Heart attack Father   . Diverticulitis Mother   . COPD Mother   . Hypertension Mother   . Heart disease Mother   . Heart attack Mother   . Heart attack Brother   . Colon cancer Brother   . Diabetes Brother   . Thyroid disease Brother     SOCIAL HISTORY:  Social History   Socioeconomic History  . Marital status: Married    Spouse name: [name]  . Number of children: 3  . Years of education: 14  . Highest education level: Not on file  Occupational History  . Occupation: Retired  Employer: RETIRED  Tobacco Use  . Smoking status: Former Smoker    Packs/day: 1.00    Years: 35.00    Pack years: 35.00    Types: Cigarettes    Start date: 02/22/1967    Quit date: 11/27/2017    Years since quitting: 2.2  . Smokeless tobacco: Never Used  Vaping Use  . Vaping Use: Never used  Substance and Sexual Activity  . Alcohol use: No  . Drug use: No  . Sexual activity: Never  Other Topics Concern  . Not on file  Social History Narrative   Patient is married [name]) and lives at home with her husband.   Patient has three children.   Patient is retired on disability.   Patient drinks two cups of caffeine daily.   Patient is right-handed.         Social Determinants of Health   Financial Resource Strain:   . Difficulty of Paying Living Expenses: Not on file  Food Insecurity:   . Worried About Charity fundraiser in the Last Year: Not on file  . Ran Out of Food in the Last Year: Not on file  Transportation Needs:   . Lack of Transportation (Medical): Not on file  . Lack of Transportation (Non-Medical): Not on file  Physical Activity:   . Days of Exercise per Week: Not on file  . Minutes of Exercise per Session: Not on file  Stress:   . Feeling of Stress : Not on file  Social Connections:   . Frequency of Communication with Friends and Family: Not on file  . Frequency of  Social Gatherings with Friends and Family: Not on file  . Attends Religious Services: Not on file  . Active Member of Clubs or Organizations: Not on file  . Attends Archivist Meetings: Not on file  . Marital Status: Not on file  Intimate Partner Violence:   . Fear of Current or Ex-Partner: Not on file  . Emotionally Abused: Not on file  . Physically Abused: Not on file  . Sexually Abused: Not on file    ALLERGIES: Mercaptopurine and Remicade [infliximab]  MEDICATIONS:  Current Facility-Administered Medications  Medication Dose Route Frequency Provider Last Rate Last Admin  . 0.9 %  sodium chloride infusion  100 mL Intravenous PRN Valentina Gu, NP      . 0.9 %  sodium chloride infusion  100 mL Intravenous PRN Valentina Gu, NP      . acetaminophen (TYLENOL) tablet 650 mg  650 mg Oral Q6H PRN Pahwani, Rinka R, MD       Or  . acetaminophen (TYLENOL) suppository 650 mg  650 mg Rectal Q6H PRN Pahwani, Rinka R, MD      . albuterol (VENTOLIN HFA) 108 (90 Base) MCG/ACT inhaler 2 puff  2 puff Inhalation Q6H PRN Bonnielee Haff, MD   2 puff at 02/16/20 1021  . [START ON 02/17/2020] calcitRIOL (ROCALTROL) capsule 0.25 mcg  0.25 mcg Oral Q M,W,F-HD Bonnielee Haff, MD      . calcium acetate (PHOSLO) capsule 667 mg  667 mg Oral TID WC Bonnielee Haff, MD      . Chlorhexidine Gluconate Cloth 2 % PADS 6 each  6 each Topical Q0600 Valentina Gu, NP   6 each at 02/16/20 [phone]  . dexamethasone (DECADRON) injection 4 mg  4 mg Intravenous Q6H Bonnielee Haff, MD   4 mg at 02/16/20 1025  . diltiazem (CARDIZEM) tablet 30 mg  30 mg  Oral Q8H Opyd, Ilene Qua, MD   30 mg at 02/16/20 0506  . fluticasone furoate-vilanterol (BREO ELLIPTA) 100-25 MCG/INH 1 puff  1 puff Inhalation Daily Pierce, Dwayne A, RPH   1 puff at 02/16/20 1021   And  . umeclidinium bromide (INCRUSE ELLIPTA) 62.5 MCG/INH 1 puff  1 puff Inhalation Daily Pierce, Dwayne A, RPH   1 puff at 02/16/20 1021  .  hydrALAZINE (APRESOLINE) injection 10 mg  10 mg Intravenous Q6H PRN Pahwani, Rinka R, MD      . HYDROmorphone (DILAUDID) injection 0.5-1 mg  0.5-1 mg Intravenous Q2H PRN Pahwani, Rinka R, MD      . levothyroxine (SYNTHROID) tablet 125 mcg  125 mcg Oral Q0600 Bonnielee Haff, MD      . lidocaine (PF) (XYLOCAINE) 1 % injection 5 mL  5 mL Intradermal PRN Valentina Gu, NP      . lidocaine-prilocaine (EMLA) cream 1 application  1 application Topical PRN Valentina Gu, NP      . metoprolol tartrate (LOPRESSOR) tablet 25 mg  25 mg Oral BID Opyd, Ilene Qua, MD   25 mg at 02/16/20 1021  . midodrine (PROAMATINE) tablet 10 mg  10 mg Oral TID WC Bonnielee Haff, MD      . ondansetron [facility]) tablet 4 mg  4 mg Oral Q6H PRN Pahwani, Rinka R, MD       Or  . ondansetron (ZOFRAN) injection 4 mg  4 mg Intravenous Q6H PRN Pahwani, Rinka R, MD      . oxyCODONE (Oxy IR/ROXICODONE) immediate release tablet 5 mg  5 mg Oral Q4H PRN Pahwani, Rinka R, MD   5 mg at 02/16/20 0702  . pantoprazole (PROTONIX) EC tablet 40 mg  40 mg Oral BID Bonnielee Haff, MD   40 mg at 02/16/20 1021  . pentafluoroprop-tetrafluoroeth (GEBAUERS) aerosol 1 application  1 application Topical PRN Valentina Gu, NP        REVIEW OF SYSTEMS:  On review of systems, the patient reports that she is doing fairly well overall. Her headache which prompted admission, is improved with the current pain medication regimen. She denies any associated blurry vision, lightheadedness, dizziness, focal weakness or paresthesias in her hands or feet. She has not had any seizure activity to her knowledge. She denies any chest pain, increased shortness of breath, cough, fevers, chills, or night sweats. She is on 3 L of oxygen via nasal cannula at home at baseline. She normally has dialysis on Mondays/Wednesday/Fridays. She has had unintended weight loss over the last 2 to 3 months but could not quantify exactly how much.. She reports dark-colored  stools but denies abdominal pain, nausea, vomiting, diarrhea or constipation. She denies any new musculoskeletal or joint aches or pains. A complete review of systems is obtained and is otherwise negative.    PHYSICAL EXAM:  Wt Readings from Last 3 Encounters:  02/16/20 112 lb 10.5 oz (51.1 kg)  02/03/20 108 lb 14.5 oz (49.4 kg)  12/27/19 112 lb 9.6 oz (51.1 kg)   Temp Readings from Last 3 Encounters:  02/16/20 98.5 F (36.9 C) (Oral)  02/03/20 98.1 F (36.7 C) (Oral)  12/06/19 98.2 F (36.8 C) (Temporal)   BP Readings from Last 3 Encounters:  02/16/20 102/69  02/03/20 131/69  12/27/19 122/66   Pulse Readings from Last 3 Encounters:  02/16/20 79  02/03/20 88  12/27/19 90   Pain Assessment Pain Score: 0-No pain/10  Unable to assess due to telephone consult visit format.  KPS = 70  100 - Normal; no complaints; no evidence of disease. 90   - Able to carry on normal activity; minor signs or symptoms of disease. 80   - Normal activity with effort; some signs or symptoms of disease. 52   - Cares for self; unable to carry on normal activity or to do active work. 60   - Requires occasional assistance, but is able to care for most of his personal needs. 50   - Requires considerable assistance and frequent medical care. 39   - Disabled; requires special care and assistance. 81   - Severely disabled; hospital admission is indicated although death not imminent. 64   - Very sick; hospital admission necessary; active supportive treatment necessary. 10   - Moribund; fatal processes progressing rapidly. 0     - Dead  Karnofsky DA, Abelmann [place], Craver LS and Burchenal [facility] [phone]) The use of the nitrogen mustards in the palliative treatment of carcinoma: with particular reference to bronchogenic carcinoma Cancer 1 634-56  LABORATORY DATA:  Lab Results  Component Value Date   WBC 5.3 02/16/2020   HGB 6.9 (LL) 02/16/2020   HCT 22.9 (L) 02/16/2020   MCV 101.3 (H) 02/16/2020   PLT  138 (L) 02/16/2020   Lab Results  Component Value Date   NA 138 02/16/2020   K 4.1 02/16/2020   CL 107 02/16/2020   CO2 14 (L) 02/16/2020   Lab Results  Component Value Date   ALT 16 02/16/2020   AST 20 02/16/2020   ALKPHOS 44 02/16/2020   BILITOT 1.2 02/16/2020     RADIOGRAPHY: CT Head Wo Contrast  Result Date: 02/15/2020 CLINICAL DATA:  Headache.  Lung cancer. EXAM: CT HEAD WITHOUT CONTRAST TECHNIQUE: Contiguous axial images were obtained from the base of the skull through the vertex without intravenous contrast. COMPARISON:  MRI 12/01/2019 FINDINGS: Brain: There is an area of low-density noted in the right parietal subcortical white matter, new since prior MRI. Given history of lung cancer, cannot exclude metastasis in this region. No hemorrhage or hydrocephalus. Vascular: No hyperdense vessel or unexpected calcification. Skull: No acute calvarial abnormality. Sinuses/Orbits: Visualized paranasal sinuses and mastoids clear. Orbital soft tissues unremarkable. Other: None IMPRESSION: Area of subcortical low-density in the right parietal lobe. Cannot exclude focal lesion/metastasis. Consider further evaluation with MRI with and without contrast. Electronically Signed   By: Rolm Baptise M.D.   On: 02/15/2020 11:42   MR BRAIN WO CONTRAST  Result Date: 02/16/2020 CLINICAL DATA:  Initial evaluation for possible brain mass. EXAM: MRI HEAD WITHOUT CONTRAST TECHNIQUE: Multiplanar, multiecho pulse sequences of the brain and surrounding structures were obtained without intravenous contrast. COMPARISON:  Comparison made with prior CT from earlier same day as well as recent brain MRI from 12/01/2019. FINDINGS: Brain: There has been interval development of multifocal areas of T2/FLAIR hyperintensity involving both cerebral and cerebellar hemispheres, consistent with vasogenic edema related to new metastatic disease. These foci are predominantly positioned along the gray-white matter differentiation.  Largest area within the left cerebral hemisphere seen at the anterior left frontal lobe and measures 18 mm (series 11, image 17). Largest area within the right cerebral hemisphere seen at the right temporal occipital junction and measures 2.3 cm (series 11, image 12). Largest infratentorial area seen within r the right cerebellum and measures 1.7 cm. No significant regional mass effect about any of these foci at this time. There are at least 10 lesions in total. Evaluation for underlying lesion size limited given lack  of IV contrast, although 1 lesion positioned at the subcortical right parietal lobe measures 5 mm, measured well on T2 weighted sequence (series 10, image 16). Few scattered foci of susceptibility artifacts seen associated with these lesions, consistent with necrosis and/or hemorrhage. Underlying atrophy with chronic microvascular ischemic disease again noted. No evidence for acute or subacute infarct. Gray-white matter differentiation otherwise maintained. Ventricles within normal limits without hydrocephalus. No extra-axial fluid collection. Pituitary gland and suprasellar region within normal limits. Midline structures intact. Vascular: Major intracranial vascular flow voids are maintained. Skull and upper cervical spine: Craniocervical junction within normal limits. Bone marrow signal intensity normal. No focal marrow replacing lesion. No scalp soft tissue abnormality. Sinuses/Orbits: Patient status post bilateral ocular lens replacement. Paranasal sinuses are largely clear. No significant mastoid effusion. Other: None. IMPRESSION: Interval development of multifocal areas of T2/FLAIR hyperintensity involving both cerebral and cerebellar hemispheres, consistent with vasogenic edema related to new metastatic disease. No significant regional mass effect at this time. Follow-up examination with postcontrast imaging could be performed for complete assessment as clinically warranted. Please note that the  risk of NSF is considered to be negligible with the use of group II agents (Gadavist). Electronically Signed   By: Jeannine Boga M.D.   On: 02/16/2020 01:05   DG Chest Port 1 View  Result Date: 01/30/2020 CLINICAL DATA:  Shortness of breath.  COPD.  On dialysis. EXAM: PORTABLE CHEST 1 VIEW COMPARISON:  11/18/2019 plain film and CT. FINDINGS: Hyperinflation. Left axillary vascular stent. Surgical clips in the lower neck. Mild cardiomegaly. Atherosclerosis in the transverse aorta. Left perihilar lung nodule has enlarged, 2.8 cm today versus 2.1 cm on 11/18/2019. No pleural effusion or pneumothorax. Numerous leads and wires project over the chest. No lobar consolidation. IMPRESSION: Hyperinflation, without acute superimposed disease. Aortic Atherosclerosis (ICD10-I70.0). Enlarging left upper lobe lung nodule, as on prior exams (metastatic disease per CT of 11/18/2019). Electronically Signed   By: Abigail Miyamoto M.D.   On: 01/30/2020 17:30      IMPRESSION/PLAN:  This visit was conducted via telephone to spare the patient unnecessary potential exposure in the healthcare setting during the current COVID-19 pandemic.  4. 74 y.o. female with newly diagnosed brain metastases felt most likely secondary to primary bronchogenic neoplasm but tissue confirmation has not yet been obtained. Today, I talked to the patient and family about the findings and workup thus far. We discussed the natural history of metastatic lung carcinoma and general treatment, highlighting the role of radiotherapy in the management of metastatic disease to the brain. At this point, we feel that the patient would potentially benefit from radiotherapy but we need more information prior to formulating a formal plan. We discussed the importance of biopsy for tissue confirmation which will guide our treatment recommendation. We also discussed the need for contrast enhanced MRI brain imaging for further evaluation and to assist with treatment  planning as well. We have placed orders for STAT SRS protocol MRI brain with gadavist contrast in hopes that this exam will be completed today and we will have more information for review at the upcoming multidisciplinary neurooncology conference.  We briefly reviewed the options which include whole brain irradiation versus stereotactic radiosurgery and the pros and cons associated with each of these potential treatment options. Whole brain radiotherapy would treat the known metastatic deposits and help provide some reduction of risk for future brain metastases. However, whole brain radiotherapy carries potential risks including hair loss, subacute somnolence, and neurocognitive changes including a possible reduction in  short-term memory. Whole brain radiotherapy also may carry a lower likelihood of tumor control at the treatment sites because of the low-dose used. Stereotactic radiosurgery carries a higher likelihood for local tumor control at the targeted sites with lower associated risk for neurocognitive changes such as memory loss. However, the use of stereotactic radiosurgery in this setting may leave the patient at increased risk for new brain metastases elsewhere in the brain as high as 50-60%. Accordingly, patients who receive stereotactic radiosurgery in this setting should undergo ongoing surveillance imaging with brain MRI more frequently in order to identify and treat new small brain metastases before they become symptomatic. Stereotactic radiosurgery does carry some different risks, including a risk of radionecrosis. She understands that the appropriate treatment option will be dependant on her biopsy results as well as upcoming SRS protocol MRI and we will discuss these options further once we have all of the necessary information, pending she is in agreement to proceed with tissue confirmation. At the time of our discussion, she reports that she would like to have further discussion with her husband  prior to making any decisions about bronchoscopy/biopsy and may even be considering hospice/supportive care versus curative therapy. In the event that she declines curative treatment, we would still recommend proceeding with palliative treatment of the brain disease for symptom management.  The patient was encouraged to ask questions that were answered to her stated satisfaction. The current plan is to proceed with SRS protocol brain MRI for review at upcoming multidisciplinary neurooncology conference. She will need hemodialysis following the study. We recommend continuing the Decadron 4 mg p.o. 3 times daily. Ideally, we will await biopsy results for tissue confirmation prior to initiating radiation but if she elects not to pursue curative treatment of her systemic disease, we would still offer palliative brain radiation. She appears to have a good understanding of our recommendations and is comfortable and in agreement with the stated plan.  We will continue to follow her progress and plan to follow-up with the patient next week, following brain tumor board discussion.  In a visit lasting 70 minutes, greater than 50% of that time was spent in telephone conversation and chart review, discussing her case and coordinating her care.  Given current concerns for patient exposure during the COVID-19 pandemic, this encounter was conducted via telephone. The patient was notified in advance and was offered a [facility] meeting to allow for face to face communication but unfortunately reported that he/she did not have the appropriate resources/technology to support such a visit and instead preferred to proceed with telephone consult. The attendants for this meeting include Arlando Leisinger PA-C, and patient, Airika Alkhatib. During the encounter, Andon Villard PA-C, was located at [facility].  Patient, Raeli Wiens was located at [place], PA-C    Tyler Pita, MD  [facility] Direct Dial: [phone]  Fax: [phone] [email or web address]  Skype  LinkedIn

## 2020-02-16 NOTE — Progress Notes (Signed)
HEMATOLOGY-ONCOLOGY PROGRESS NOTE  SUBJECTIVE: Natasha [name] was seen during dialysis today.  She is a little bit sleepy.  She tells me that her headaches are better controlled at this time.  She is not having any neurological changes and no seizures.  No chest pain or shortness of breath reported.  REVIEW OF SYSTEMS:   Constitutional: Denies fevers, chills  Eyes: Denies blurriness of vision Ears, nose, mouth, throat, and face: Denies mucositis or sore throat Respiratory: Denies cough, dyspnea or wheezes Cardiovascular: Denies palpitation, chest discomfort Gastrointestinal:  Denies nausea, heartburn or change in bowel habits Skin: Denies abnormal skin rashes Lymphatics: Denies new lymphadenopathy or easy bruising Neurological: Headaches improved, no seizures Behavioral/Psych: Mood is stable, no new changes  Extremities: No lower extremity edema All other systems were reviewed with the patient and are negative.  I have reviewed the past medical history, past surgical history, social history and family history with the patient and they are unchanged from previous note.   PHYSICAL EXAMINATION: ECOG PERFORMANCE STATUS: 2 - Symptomatic, <50% confined to bed  Vitals:   02/16/20 0415 02/16/20 1125  BP: 124/68 102/69  Pulse: 77 79  Resp: 18 18  Temp: 97.9 F (36.6 C) 98.5 F (36.9 C)  SpO2: 100% 100%   Filed Weights   02/15/20 2102 02/16/20 0415  Weight: 51.1 kg 51.1 kg    Intake/Output from previous day: 09/22 0701 - 09/23 0700 In: 120 [P.O.:120] Out: -   GENERAL:alert, no distress and comfortable SKIN: skin color, texture, turgor are normal, no rashes or significant lesions EYES: normal, Conjunctiva are pink and non-injected, sclera clear OROPHARYNX:no exudate, no erythema and lips, buccal mucosa, and tongue normal  LUNGS: Diminished bilaterally HEART: regular rate & rhythm and no murmurs and no lower extremity edema ABDOMEN:abdomen soft, non-tender and normal bowel sounds   NEURO: alert & oriented x 3 with fluent speech, no focal motor/sensory deficits  LABORATORY DATA:  I have reviewed the data as listed CMP Latest Ref Rng & Units 02/16/2020 02/15/2020 02/03/2020  Glucose 70 - 99 mg/dL 43(LL) 75 111(H)  BUN 8 - 23 mg/dL 46(H) 40(H) 22  Creatinine 0.44 - 1.00 mg/dL 10.76(H) 9.30(H) 5.86(H)  Sodium 135 - 145 mmol/L 138 136 135  Potassium 3.5 - 5.1 mmol/L 4.1 3.6 3.2(L)  Chloride 98 - 111 mmol/L 107 103 100  CO2 22 - 32 mmol/L 14(L) 18(L) 24  Calcium 8.9 - 10.3 mg/dL 8.5(L) 8.6(L) 8.5(L)  Total Protein 6.5 - 8.1 g/dL 6.0(L) 6.4(L) -  Total Bilirubin 0.3 - 1.2 mg/dL 1.2 0.8 -  Alkaline Phos 38 - 126 U/L 44 41 -  AST 15 - 41 U/L 20 18 -  ALT 0 - 44 U/L 16 14 -    Lab Results  Component Value Date   WBC 5.3 02/16/2020   HGB 6.9 (LL) 02/16/2020   HCT 22.9 (L) 02/16/2020   MCV 101.3 (H) 02/16/2020   PLT 138 (L) 02/16/2020   NEUTROABS 3.4 02/03/2020    CT Head Wo Contrast  Result Date: 02/15/2020 CLINICAL DATA:  Headache.  Lung cancer. EXAM: CT HEAD WITHOUT CONTRAST TECHNIQUE: Contiguous axial images were obtained from the base of the skull through the vertex without intravenous contrast. COMPARISON:  MRI 12/01/2019 FINDINGS: Brain: There is an area of low-density noted in the right parietal subcortical white matter, new since prior MRI. Given history of lung cancer, cannot exclude metastasis in this region. No hemorrhage or hydrocephalus. Vascular: No hyperdense vessel or unexpected calcification. Skull: No acute calvarial  abnormality. Sinuses/Orbits: Visualized paranasal sinuses and mastoids clear. Orbital soft tissues unremarkable. Other: None IMPRESSION: Area of subcortical low-density in the right parietal lobe. Cannot exclude focal lesion/metastasis. Consider further evaluation with MRI with and without contrast. Electronically Signed   By: Rolm Baptise M.D.   On: 02/15/2020 11:42   MR BRAIN WO CONTRAST  Result Date: 02/16/2020 CLINICAL DATA:  Initial  evaluation for possible brain mass. EXAM: MRI HEAD WITHOUT CONTRAST TECHNIQUE: Multiplanar, multiecho pulse sequences of the brain and surrounding structures were obtained without intravenous contrast. COMPARISON:  Comparison made with prior CT from earlier same day as well as recent brain MRI from 12/01/2019. FINDINGS: Brain: There has been interval development of multifocal areas of T2/FLAIR hyperintensity involving both cerebral and cerebellar hemispheres, consistent with vasogenic edema related to new metastatic disease. These foci are predominantly positioned along the gray-white matter differentiation. Largest area within the left cerebral hemisphere seen at the anterior left frontal lobe and measures 18 mm (series 11, image 17). Largest area within the right cerebral hemisphere seen at the right temporal occipital junction and measures 2.3 cm (series 11, image 12). Largest infratentorial area seen within r the right cerebellum and measures 1.7 cm. No significant regional mass effect about any of these foci at this time. There are at least 10 lesions in total. Evaluation for underlying lesion size limited given lack of IV contrast, although 1 lesion positioned at the subcortical right parietal lobe measures 5 mm, measured well on T2 weighted sequence (series 10, image 16). Few scattered foci of susceptibility artifacts seen associated with these lesions, consistent with necrosis and/or hemorrhage. Underlying atrophy with chronic microvascular ischemic disease again noted. No evidence for acute or subacute infarct. Gray-white matter differentiation otherwise maintained. Ventricles within normal limits without hydrocephalus. No extra-axial fluid collection. Pituitary gland and suprasellar region within normal limits. Midline structures intact. Vascular: Major intracranial vascular flow voids are maintained. Skull and upper cervical spine: Craniocervical junction within normal limits. Bone marrow signal intensity  normal. No focal marrow replacing lesion. No scalp soft tissue abnormality. Sinuses/Orbits: Patient status post bilateral ocular lens replacement. Paranasal sinuses are largely clear. No significant mastoid effusion. Other: None. IMPRESSION: Interval development of multifocal areas of T2/FLAIR hyperintensity involving both cerebral and cerebellar hemispheres, consistent with vasogenic edema related to new metastatic disease. No significant regional mass effect at this time. Follow-up examination with postcontrast imaging could be performed for complete assessment as clinically warranted. Please note that the risk of NSF is considered to be negligible with the use of group II agents (Gadavist). Electronically Signed   By: Jeannine Boga M.D.   On: 02/16/2020 01:05   DG Chest Port 1 View  Result Date: 01/30/2020 CLINICAL DATA:  Shortness of breath.  COPD.  On dialysis. EXAM: PORTABLE CHEST 1 VIEW COMPARISON:  11/18/2019 plain film and CT. FINDINGS: Hyperinflation. Left axillary vascular stent. Surgical clips in the lower neck. Mild cardiomegaly. Atherosclerosis in the transverse aorta. Left perihilar lung nodule has enlarged, 2.8 cm today versus 2.1 cm on 11/18/2019. No pleural effusion or pneumothorax. Numerous leads and wires project over the chest. No lobar consolidation. IMPRESSION: Hyperinflation, without acute superimposed disease. Aortic Atherosclerosis (ICD10-I70.0). Enlarging left upper lobe lung nodule, as on prior exams (metastatic disease per CT of 11/18/2019). Electronically Signed   By: Abigail Miyamoto M.D.   On: 01/30/2020 17:30    ASSESSMENT AND PLAN: This is a very pleasant 74 year old African-American female with highly suspicious metastatic lung cancer.  She presented with a left  upper lobe lung mass in addition to left hilar/suprahilar and suspicious bone metastasis at T10/T11.  We do not yet have a tissue diagnosis.  The patient was last seen by medical oncology on 12/06/2019 to discuss a  biopsy of the T11 inferior endplate lesion which was nondiagnostic and showed atypical lymphoid tissue.  We also discussed the MRI of the brain results that were obtained on 12/01/2019 which were negative for brain metastasis at that time.  Her case was discussed at the thoracic conference and recommendation was for her to undergo bronchoscopy to obtain tissue diagnosis.  The patient opted not to pursue bronchoscopy.  The patient presented to the hospital with severe headaches.  Unfortunately, MRI of the brain showed multifocal areas involving both cerebral and cerebellar hemispheres consistent with vasogenic edema related to new metastatic disease.  She has been started on dexamethasone 4 mg every 6 hours with improvement of her headaches.  I have discussed the MRI findings with the patient.  Explained to the patient that she likely has metastatic lung cancer.  We would again recommend obtaining tissue diagnosis with bronchoscopy.  Pulmonology has seen the patient already today and the patient is undecided about proceeding.  We discussed that we would need tissue diagnosis before we could have a discussion regarding systemic therapy such as chemotherapy and/or immunotherapy. We will await her decision.  If she opts not to pursue bronchoscopy, we could reach out to IR to see if CT-guided biopsy is possible.  I also talked with the patient about palliative care/hospice if she opts not to pursue the biopsy.  Agree with palliative care consult which has already been requested for helping [place] to define goals of care.  The patient has been referred to radiation oncology for consideration of radiation to her brain mets.  I will follow up with the patient tomorrow for ongoing discussion.    LOS: 1 day   Mikey Bussing, DNP, AGPCNP-BC, AOCNP 02/16/20

## 2020-02-16 NOTE — Progress Notes (Signed)
Follow-up CBG 97.

## 2020-02-17 ENCOUNTER — Telehealth: Payer: Self-pay | Admitting: Pulmonary Disease

## 2020-02-17 ENCOUNTER — Inpatient Hospital Stay (HOSPITAL_COMMUNITY): Payer: Medicare Other

## 2020-02-17 ENCOUNTER — Other Ambulatory Visit: Payer: Self-pay | Admitting: Radiation Therapy

## 2020-02-17 DIAGNOSIS — R918 Other nonspecific abnormal finding of lung field: Secondary | ICD-10-CM

## 2020-02-17 DIAGNOSIS — Z7189 Other specified counseling: Secondary | ICD-10-CM

## 2020-02-17 DIAGNOSIS — Z515 Encounter for palliative care: Secondary | ICD-10-CM

## 2020-02-17 DIAGNOSIS — G44011 Episodic cluster headache, intractable: Secondary | ICD-10-CM

## 2020-02-17 DIAGNOSIS — Z66 Do not resuscitate: Secondary | ICD-10-CM

## 2020-02-17 DIAGNOSIS — J9611 Chronic respiratory failure with hypoxia: Secondary | ICD-10-CM

## 2020-02-17 LAB — CBC
HCT: 26.6 % — ABNORMAL LOW (ref 36.0–46.0)
Hemoglobin: 8.4 g/dL — ABNORMAL LOW (ref 12.0–15.0)
MCH: 29.7 pg (ref 26.0–34.0)
MCHC: 31.6 g/dL (ref 30.0–36.0)
MCV: 94 fL (ref 80.0–100.0)
Platelets: 143 10*3/uL — ABNORMAL LOW (ref 150–400)
RBC: 2.83 MIL/uL — ABNORMAL LOW (ref 3.87–5.11)
RDW: 19.2 % — ABNORMAL HIGH (ref 11.5–15.5)
WBC: 3.5 10*3/uL — ABNORMAL LOW (ref 4.0–10.5)
nRBC: 0 % (ref 0.0–0.2)

## 2020-02-17 LAB — GLUCOSE, CAPILLARY
Glucose-Capillary: 111 mg/dL — ABNORMAL HIGH (ref 70–99)
Glucose-Capillary: 71 mg/dL (ref 70–99)
Glucose-Capillary: 121 mg/dL — ABNORMAL HIGH (ref 70–99)
Glucose-Capillary: 117 mg/dL — ABNORMAL HIGH (ref 70–99)

## 2020-02-17 LAB — TYPE AND SCREEN
ABO/RH(D): O POS
Antibody Screen: NEGATIVE
Unit division: 0

## 2020-02-17 LAB — BASIC METABOLIC PANEL
Anion gap: 14 (ref 5–15)
BUN: 22 mg/dL (ref 8–23)
CO2: 24 mmol/L (ref 22–32)
Calcium: 8.6 mg/dL — ABNORMAL LOW (ref 8.9–10.3)
Chloride: 98 mmol/L (ref 98–111)
Creatinine, Ser: 5.39 mg/dL — ABNORMAL HIGH (ref 0.44–1.00)
GFR calc Af Amer: 8 mL/min — ABNORMAL LOW (ref >60)
GFR calc non Af Amer: 7 mL/min — ABNORMAL LOW (ref >60)
Glucose, Bld: 113 mg/dL — ABNORMAL HIGH (ref 70–99)
Potassium: 3.4 mmol/L — ABNORMAL LOW (ref 3.5–5.1)
Sodium: 136 mmol/L (ref 135–145)

## 2020-02-17 LAB — BPAM RBC
Blood Product Expiration Date: 202110262359
ISSUE DATE / TIME: 202109231514
Unit Type and Rh: 5100

## 2020-02-17 MED ORDER — POTASSIUM CHLORIDE CRYS ER 10 MEQ PO TBCR
10.0000 meq | EXTENDED_RELEASE_TABLET | Freq: Once | ORAL | Status: AC
  Administered 2020-02-17: 10 meq via ORAL
  Filled 2020-02-17: qty 1

## 2020-02-17 MED ORDER — MIDODRINE HCL 5 MG PO TABS
ORAL_TABLET | ORAL | Status: AC
  Filled 2020-02-17: qty 2

## 2020-02-17 MED ORDER — DEXAMETHASONE SODIUM PHOSPHATE 4 MG/ML IJ SOLN
4.0000 mg | Freq: Three times a day (TID) | INTRAMUSCULAR | Status: DC
  Administered 2020-02-17 – 2020-02-21 (×10): 4 mg via INTRAVENOUS
  Filled 2020-02-17 (×10): qty 1

## 2020-02-17 MED ORDER — CALCITRIOL 0.25 MCG PO CAPS
ORAL_CAPSULE | ORAL | Status: AC
  Filled 2020-02-17: qty 1

## 2020-02-17 NOTE — Progress Notes (Signed)
Patient off unit, HD.

## 2020-02-17 NOTE — Progress Notes (Signed)
Pulmonary Note 02/17/2020 S Seen in f/u for lung mass with brain metastasis.  No events.  Wants to get out of bed.  O Today's Vitals   02/17/20 1040 02/17/20 1045 02/17/20 1112 02/17/20 1200  BP: 124/60 139/69 (!) 117/59   Pulse: 83 83 74   Resp:  18 18   Temp:  98.5 F (36.9 C) 98.3 F (36.8 C)   TempSrc:  Oral Oral   SpO2:  99% 100%   Weight:      Height:      PainSc:   6  4    Body mass index is 20.25 kg/m. On exam, no acute distress, wheezing in both lung fields, moves all 4 ext equally.  On home 4L.  Labs and imaging reviewed.  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 Byrum and Icard to get the procedure done at some point next week, inpatient or outpatient. -Continue steroids -Fine for DC from my end, please reach out if any questions or concerns.  Erskine Emery MD PCCM

## 2020-02-17 NOTE — Plan of Care (Signed)

## 2020-02-17 NOTE — Progress Notes (Signed)
HEMATOLOGY-ONCOLOGY PROGRESS NOTE  SUBJECTIVE: The patient had dialysis again this morning.  Stated that she got a little hypotensive during the session and they had to end about 15 minutes early.  Headaches are overall well controlled.  She has spoken with her husband and has opted to proceed with bronchoscopy.  REVIEW OF SYSTEMS:   Constitutional: Denies fevers, chills  Eyes: Denies blurriness of vision Ears, nose, mouth, throat, and face: Denies mucositis or sore throat Respiratory: Denies cough, dyspnea or wheezes Cardiovascular: Denies palpitation, chest discomfort Gastrointestinal:  Denies nausea, heartburn or change in bowel habits Skin: Denies abnormal skin rashes Lymphatics: Denies new lymphadenopathy or easy bruising Neurological: Headaches improved, no seizures Behavioral/Psych: Mood is stable, no new changes  Extremities: No lower extremity edema All other systems were reviewed with the patient and are negative.  I have reviewed the past medical history, past surgical history, social history and family history with the patient and they are unchanged from previous note.   PHYSICAL EXAMINATION: ECOG PERFORMANCE STATUS: 2 - Symptomatic, <50% confined to bed  Vitals:   02/17/20 1045 02/17/20 1112  BP: 139/69 (!) 117/59  Pulse: 83 74  Resp: 18 18  Temp: 98.5 F (36.9 C) 98.3 F (36.8 C)  SpO2: 99% 100%   Filed Weights   02/16/20 1305 02/16/20 1740 02/17/20 0454  Weight: 55.1 kg 53.5 kg 53.5 kg    Intake/Output from previous day: 09/23 0701 - 09/24 0700 In: 435 [P.O.:120; Blood:315] Out: 1600   GENERAL:alert, no distress and comfortable SKIN: skin color, texture, turgor are normal, no rashes or significant lesions EYES: normal, Conjunctiva are pink and non-injected, sclera clear OROPHARYNX:no exudate, no erythema and lips, buccal mucosa, and tongue normal  LUNGS: Diminished bilaterally HEART: regular rate & rhythm and no murmurs and no lower extremity  edema ABDOMEN:abdomen soft, non-tender and normal bowel sounds  NEURO: alert & oriented x 3 with fluent speech, no focal motor/sensory deficits  LABORATORY DATA:  I have reviewed the data as listed CMP Latest Ref Rng & Units 02/17/2020 02/16/2020 02/15/2020  Glucose 70 - 99 mg/dL 113(H) 43(LL) 75  BUN 8 - 23 mg/dL 22 46(H) 40(H)  Creatinine 0.44 - 1.00 mg/dL 5.39(H) 10.76(H) 9.30(H)  Sodium 135 - 145 mmol/L 136 138 136  Potassium 3.5 - 5.1 mmol/L 3.4(L) 4.1 3.6  Chloride 98 - 111 mmol/L 98 107 103  CO2 22 - 32 mmol/L 24 14(L) 18(L)  Calcium 8.9 - 10.3 mg/dL 8.6(L) 8.5(L) 8.6(L)  Total Protein 6.5 - 8.1 g/dL - 6.0(L) 6.4(L)  Total Bilirubin 0.3 - 1.2 mg/dL - 1.2 0.8  Alkaline Phos 38 - 126 U/L - 44 41  AST 15 - 41 U/L - 20 18  ALT 0 - 44 U/L - 16 14    Lab Results  Component Value Date   WBC 3.5 (L) 02/17/2020   HGB 8.4 (L) 02/17/2020   HCT 26.6 (L) 02/17/2020   MCV 94.0 02/17/2020   PLT 143 (L) 02/17/2020   NEUTROABS 3.4 02/03/2020    CT Head Wo Contrast  Result Date: 02/15/2020 CLINICAL DATA:  Headache.  Lung cancer. EXAM: CT HEAD WITHOUT CONTRAST TECHNIQUE: Contiguous axial images were obtained from the base of the skull through the vertex without intravenous contrast. COMPARISON:  MRI 12/01/2019 FINDINGS: Brain: There is an area of low-density noted in the right parietal subcortical white matter, new since prior MRI. Given history of lung cancer, cannot exclude metastasis in this region. No hemorrhage or hydrocephalus. Vascular: No hyperdense vessel or  unexpected calcification. Skull: No acute calvarial abnormality. Sinuses/Orbits: Visualized paranasal sinuses and mastoids clear. Orbital soft tissues unremarkable. Other: None IMPRESSION: Area of subcortical low-density in the right parietal lobe. Cannot exclude focal lesion/metastasis. Consider further evaluation with MRI with and without contrast. Electronically Signed   By: Rolm Baptise M.D.   On: 02/15/2020 11:42   MR BRAIN WO  CONTRAST  Result Date: 02/16/2020 CLINICAL DATA:  Initial evaluation for possible brain mass. EXAM: MRI HEAD WITHOUT CONTRAST TECHNIQUE: Multiplanar, multiecho pulse sequences of the brain and surrounding structures were obtained without intravenous contrast. COMPARISON:  Comparison made with prior CT from earlier same day as well as recent brain MRI from 12/01/2019. FINDINGS: Brain: There has been interval development of multifocal areas of T2/FLAIR hyperintensity involving both cerebral and cerebellar hemispheres, consistent with vasogenic edema related to new metastatic disease. These foci are predominantly positioned along the gray-white matter differentiation. Largest area within the left cerebral hemisphere seen at the anterior left frontal lobe and measures 18 mm (series 11, image 17). Largest area within the right cerebral hemisphere seen at the right temporal occipital junction and measures 2.3 cm (series 11, image 12). Largest infratentorial area seen within r the right cerebellum and measures 1.7 cm. No significant regional mass effect about any of these foci at this time. There are at least 10 lesions in total. Evaluation for underlying lesion size limited given lack of IV contrast, although 1 lesion positioned at the subcortical right parietal lobe measures 5 mm, measured well on T2 weighted sequence (series 10, image 16). Few scattered foci of susceptibility artifacts seen associated with these lesions, consistent with necrosis and/or hemorrhage. Underlying atrophy with chronic microvascular ischemic disease again noted. No evidence for acute or subacute infarct. Gray-white matter differentiation otherwise maintained. Ventricles within normal limits without hydrocephalus. No extra-axial fluid collection. Pituitary gland and suprasellar region within normal limits. Midline structures intact. Vascular: Major intracranial vascular flow voids are maintained. Skull and upper cervical spine: Craniocervical  junction within normal limits. Bone marrow signal intensity normal. No focal marrow replacing lesion. No scalp soft tissue abnormality. Sinuses/Orbits: Patient status post bilateral ocular lens replacement. Paranasal sinuses are largely clear. No significant mastoid effusion. Other: None. IMPRESSION: Interval development of multifocal areas of T2/FLAIR hyperintensity involving both cerebral and cerebellar hemispheres, consistent with vasogenic edema related to new metastatic disease. No significant regional mass effect at this time. Follow-up examination with postcontrast imaging could be performed for complete assessment as clinically warranted. Please note that the risk of NSF is considered to be negligible with the use of group II agents (Gadavist). Electronically Signed   By: Jeannine Boga M.D.   On: 02/16/2020 01:05   DG Chest Port 1 View  Result Date: 01/30/2020 CLINICAL DATA:  Shortness of breath.  COPD.  On dialysis. EXAM: PORTABLE CHEST 1 VIEW COMPARISON:  11/18/2019 plain film and CT. FINDINGS: Hyperinflation. Left axillary vascular stent. Surgical clips in the lower neck. Mild cardiomegaly. Atherosclerosis in the transverse aorta. Left perihilar lung nodule has enlarged, 2.8 cm today versus 2.1 cm on 11/18/2019. No pleural effusion or pneumothorax. Numerous leads and wires project over the chest. No lobar consolidation. IMPRESSION: Hyperinflation, without acute superimposed disease. Aortic Atherosclerosis (ICD10-I70.0). Enlarging left upper lobe lung nodule, as on prior exams (metastatic disease per CT of 11/18/2019). Electronically Signed   By: Abigail Miyamoto M.D.   On: 01/30/2020 17:30    ASSESSMENT AND PLAN: This is a very pleasant 74 year old African-American female with highly suspicious metastatic lung cancer.  She presented with a left upper lobe lung mass in addition to left hilar/suprahilar and suspicious bone metastasis at T10/T11.  We do not yet have a tissue diagnosis.  The patient  was last seen by medical oncology on 12/06/2019 to discuss a biopsy of the T11 inferior endplate lesion which was nondiagnostic and showed atypical lymphoid tissue.  We also discussed the MRI of the brain results that were obtained on 12/01/2019 which were negative for brain metastasis at that time.  Her case was discussed at the thoracic conference and recommendation was for her to undergo bronchoscopy to obtain tissue diagnosis.  The patient opted not to pursue bronchoscopy.  The patient presented to the hospital with severe headaches.  Unfortunately, MRI of the brain showed multifocal areas involving both cerebral and cerebellar hemispheres consistent with vasogenic edema related to new metastatic disease.  She was started on dexamethasone 4 mg every 6 hours with improvement of her headaches.  Discussed with radiation oncology who recommends adjusting dose to every 8 hours.  I have changed the order to dexamethasone 4 mg every 8 hours.  This can be switched from IV to p.o. over the weekend.  The patient has spoken with her husband and also met with the palliative care team earlier today.  She now states that she would like to proceed with bronchoscopy to obtain biopsy.  Pulmonology has been updated regarding her decision.  I have also notified radiation oncology of her plan to proceed with biopsy.  I will defer to radiation oncology regarding timing of radiation to the brain.  I have discussed with the patient that we will arrange for follow-up with Dr. Julien Nordmann near the end of her radiation.  By this point, we should have the biopsy result for him to discuss to make further recommendations regarding treatment.   LOS: 2 days   Mikey Bussing, DNP, AGPCNP-BC, AOCNP 02/17/20

## 2020-02-17 NOTE — TOC Initial Note (Signed)
Transition of Care [facility]) - Initial/Assessment Note    Patient Details  Name: Natasha [name] MRN: [id number] Date of Birth: Jan 09, 1946  [facility]) CM/SW Contact:    Pollie Friar, RN Phone Number: 02/17/2020, 4:29 PM  Clinical Narrative:                 CM met with patient at the bedside. She lives with spouse that provides all needed transportation. She does her own medications at home and denies any issues.  CM consulted for outpatient palliative care. CM provided choice and [facility] selected. Anderson Malta with [facility] accepted the referral.  CM consulted for rollator. PT has not evaluated the patient yet so will wait to make sure pt would be safe with a rollator at home.  ToC following.   Expected Discharge Plan: Home/Self Care Barriers to Discharge: Homeless with medical needs   Patient Goals and CMS Choice     Choice offered to / list presented to : Patient  Expected Discharge Plan and Services Expected Discharge Plan: Home/Self Care   Discharge Planning Services: CM Consult Post Acute Care Choice: Durable Medical Equipment Living arrangements for the past 2 months: Single Family Home                                      Prior Living Arrangements/Services Living arrangements for the past 2 months: Single Family Home Lives with:: Spouse Patient language and need for interpreter reviewed:: Yes Do you feel safe going back to the place where you live?: Yes      Need for Family Participation in Patient Care: Yes (Comment) Care giver support system in place?: Yes (comment) Current home services: DME (home oxygen at 3-4L through [employer]) Criminal Activity/Legal Involvement Pertinent to Current Situation/Hospitalization: No - Comment as needed  Activities of Daily Living Home Assistive Devices/Equipment: Environmental consultant (specify type), Cane (specify quad or straight) ADL Screening (condition at time of admission) Patient's cognitive ability adequate to  safely complete daily activities?: Yes Is the patient deaf or have difficulty hearing?: No Does the patient have difficulty seeing, even when wearing glasses/contacts?: No Does the patient have difficulty concentrating, remembering, or making decisions?: No Patient able to express need for assistance with ADLs?: Yes Does the patient have difficulty dressing or bathing?: No Independently performs ADLs?: Yes (appropriate for developmental age) Does the patient have difficulty walking or climbing stairs?: Yes Weakness of Legs: Both Weakness of Arms/Hands: Left  Permission Sought/Granted                  Emotional Assessment Appearance:: Appears stated age Attitude/Demeanor/Rapport: Engaged Affect (typically observed): Accepting Orientation: : Oriented to Self, Oriented to Place, Oriented to  Time, Oriented to Situation   Psych Involvement: No (comment)  Admission diagnosis:  Stool guaiac positive [R19.5] Brain lesion [G93.9] Low hemoglobin [D64.9] Headache [R51.9] Patient Active Problem List   Diagnosis Date Noted  . Palliative care by specialist   . DNR (do not resuscitate)   . Metastasis to brain ([place]) 02/16/2020  . Brain lesion   . GERD (gastroesophageal reflux disease)   . Headache   . Acute GI bleeding 01/30/2020  . Acute blood loss anemia 01/30/2020  . Supraventricular tachycardia ([place]) 12/27/2019  . Arrhythmia 11/19/2019  . Elevated troponin 11/19/2019  . Epigastric abdominal pain 11/18/2019  . Malignant neoplasm of overlapping sites of left lung ([place]) 11/17/2019  . Metastatic lung carcinoma,  left ([place]) 11/15/2019  . Goals of care, counseling/discussion 11/15/2019  . Pneumonia of right lower lobe due to infectious organism 09/27/2019  . History of COVID-19 04/13/2019  . Acute on chronic respiratory failure with hypoxia ([place]) 04/13/2019  . Chronic anticoagulation 04/13/2019  . Medication management 02/22/2019  . Chronic respiratory failure with hypoxia ([place])  09/23/2018  . Hx of Pulmonary embolism ([place]) 05/28/2018  . COPD with acute exacerbation ([place]) 03/31/2018  . Acute sinusitis 03/31/2018  . Crohn's disease of colon with complication ([place])   . Aortic insufficiency 07/30/2016  . Atherosclerosis of aorta ([place]) 07/30/2016  . Pressure injury of skin 07/14/2016  . Gastroenteritis, acute 07/12/2016  . COPD (chronic obstructive pulmonary disease) ([place]) 10/12/2015  . Malnutrition of moderate degree ([place]) 09/13/2014  . history of Left leg DVT 09/10/2014  . Hypotension 09/10/2014  . Hypoglycemia 09/10/2014  . Ischiorectal abscess 09/09/2014  . Increased anion gap metabolic acidosis   . Generalized abdominal pain   . Chronic diastolic CHF (congestive heart failure) ([place])   . Hypertensive heart disease   . Secondary hyperparathyroidism ([place])   . Other specified hypothyroidism   . Right shoulder pain   . Thrombocytopenia ([place])   . Severe protein-calorie malnutrition ([place])   . Thyroid activity decreased   . Emphysema of lung ([place])   . Nephrolithiasis 03/29/2014  . Spinal stenosis of lumbar region 11/01/2013  . Bilateral leg pain 09/28/2013  . Emphysema/COPD ([place])   . Shortness of breath 09/20/2013  . Protein-calorie malnutrition, severe ([place]) 05/14/2013  . Salmonella enteritidis 06/11/2012  . Hypocalcemia 06/10/2012  . Hypothyroidism 06/10/2012  . Anemia of chronic disease 06/09/2012  . Hypomagnesemia 06/09/2012  . ESRD on dialysis ([place]) 06/09/2012  . Dehydration 06/07/2012  . Crohn's disease without complication ([place]) 44/07/4740  . Metabolic acidosis 59/56/3875  . Hypokalemia 06/07/2012  . Nausea vomiting and diarrhea 06/07/2012  . UTI (lower urinary tract infection) 06/07/2012   PCP:  [name], MD Pharmacy:   [facility] [place], [place] [address] Phone: [phone] Fax: [phone]  [facility]  [phone] - [place], [place] - [address] [facility] [address] Phone: [phone] Fax: [phone]  [facility] [place], [place] - [address] [place] [address] Phone: [phone] Fax: [phone]     Social Determinants of Health (SDOH) Interventions    Readmission Risk Interventions Readmission Risk Prevention Plan 04/15/2019  Transportation Screening Complete  Medication Review (RN Care Manager) Complete  Palliative Care Screening Not [place] Not Applicable  Some recent data might be hidden

## 2020-02-17 NOTE — Progress Notes (Signed)
   02/17/20 1045  Vital Signs  Temp 98.5 F (36.9 C)  Temp Source Oral  Pulse Rate 83  Pulse Rate Source Monitor  Resp 18  BP 139/69  BP Location Right Arm  BP Method Automatic  Patient Position (if appropriate) Lying  Oxygen Therapy  SpO2 99 %  O2 Device Nasal Cannula  O2 Flow Rate (L/min) 4 L/min  Post-Hemodialysis Assessment  Rinseback Volume (mL) 250 mL  KECN 230 V  Dialyzer Clearance Lightly streaked  Duration of HD Treatment -hour(s) 3 hour(s)  Hemodialysis Intake (mL) 500 mL  UF Total -Machine (mL) 1109 mL  Net UF (mL) 609 mL  Tolerated HD Treatment Yes  Post-Hemodialysis Comments tx complete-pt stable  AVG/AVF Arterial Site Held (minutes) 10 minutes  AVG/AVF Venous Site Held (minutes) 10 minutes  Fistula / Graft Left Upper arm Arteriovenous fistula  No placement date or time found.   Placed prior to admission: Yes  Orientation: Left  Access Location: Upper arm  Access Type: Arteriovenous fistula  Site Condition No complications  Fistula / Graft Assessment Present;Thrill;Bruit  Status Deaccessed;Flushed  Drainage Description None

## 2020-02-17 NOTE — Progress Notes (Signed)
Pt's potassium 3.4, Dr. Myna Hidalgo informed.

## 2020-02-17 NOTE — Telephone Encounter (Signed)
PCCM:  Patient is a current inpatient. Should be discharged soon.  Orders placed to set up outpatient ENB.   Tentative date 02/28/2020  CC: Vista Lawman, endo coordinator.  Orders placed in depo and forwarded to [facility] pool for PA if needed.   [facility] 02/17/2020 9:21 PM

## 2020-02-17 NOTE — Progress Notes (Signed)
[facility] Progress Note   Subjective: Awake, alert, oriented X 3. Seen on HD. MRI brain yest showing mets.   Objective Vitals:   02/17/20 1030 02/17/20 1040 02/17/20 1045 02/17/20 1112  BP: 90/60 124/60 139/69 (!) 117/59  Pulse: 83 83 83 74  Resp:   18 18  Temp:   98.5 F (36.9 C) 98.3 F (36.8 C)  TempSrc:   Oral Oral  SpO2:   99% 100%  Weight:      Height:       General: Thin, elderly female no acute distress. Neuro: Alert, oriented X 3. Nonfocal.  Lungs: Clear bilaterally to auscultation  Heart: RRR with S1 S2. No murmurs, rubs, or gallops appreciated. Abdomen: Soft, somewhat protuberant,  non-tender, non-distended  Lower extremities: 1+ pitting edema posterior calves, ankles Dialysis Access: L AVG + bruit   Dialysis: [employer] MWF 4 hrs   300/800   49.5kg   3.0 K/ 2.25 Ca  UFP 3   Hep none L AVG -Hectorol 3 mcg IV TIW -No ESA D/T Lung mass/possible malignancy.    Assessment/Plan: 1.  Acute on Chronic Anemia-  FOBT +. HGB 7.1 today > 8.4 today after prbc x 1.  2. Severe COPD / emphysema 3. Stage III/ IV lung cancer - brain mets by MRI yesterday 4. Chronic hypoxic resp failure - seen by Pulm 5.  ESRD -  MWF via AVG. HD today to get back on schedule.  6.  Hypertension/volume  - continue Midodrine 10 mg PO TIW. up 4kg.  7.  Anemia  - As noted above. H/O Crohn's disease  8.  Metabolic bone disease -C Ca OK. Continue VDRA, binders.  9.  Nutrition -Renal diet with fld restrictions. Albumin low, add protein supps, renal vits.  10.  COPD- per primary 11.  Chron's disease- per primary 12. DNR - palliative team is going to be involved  Kelly Splinter, MD 02/17/2020, 1:02 PM     Additional Objective Labs: Basic Metabolic Panel: Recent Labs  Lab 02/15/20 1110 02/15/20 1410 02/16/20 0500 02/17/20 0308  NA 136  --  138 136  K 3.6  --  4.1 3.4*  CL 103  --  107 98  CO2 18*  --  14* 24  GLUCOSE 75  --  43* 113*  BUN 40*  --  46* 22  CREATININE 9.30*   --  10.76* 5.39*  CALCIUM 8.6*  --  8.5* 8.6*  PHOS  --  8.2*  --   --    Liver Function Tests: Recent Labs  Lab 02/15/20 1110 02/16/20 0500  AST 18 20  ALT 14 16  ALKPHOS 41 44  BILITOT 0.8 1.2  PROT 6.4* 6.0*  ALBUMIN 3.2* 2.9*   No results for input(s): LIPASE, AMYLASE in the last 168 hours. CBC: Recent Labs  Lab 02/15/20 1110 02/16/20 0953 02/17/20 0308  WBC 6.3 5.3 3.5*  HGB 7.1* 6.9* 8.4*  HCT 24.2* 22.9* 26.6*  MCV 103.9* 101.3* 94.0  PLT 145* 138* 143*    Medications:  . sodium chloride   Intravenous Once  . calcitRIOL      . calcitRIOL  0.25 mcg Oral Q M,W,F-HD  . calcium acetate  667 mg Oral TID WC  . Chlorhexidine Gluconate Cloth  6 each Topical Q0600  . dexamethasone (DECADRON) injection  4 mg Intravenous Q6H  . diltiazem  30 mg Oral Q8H  . fluticasone furoate-vilanterol  1 puff Inhalation Daily   And  . umeclidinium bromide  1 puff Inhalation Daily  . levothyroxine  125 mcg Oral Q0600  . metoprolol tartrate  25 mg Oral BID  . midodrine  10 mg Oral TID WC  . pantoprazole  40 mg Oral BID

## 2020-02-17 NOTE — Progress Notes (Signed)
PROGRESS NOTE    SYDELL PROWELL  FXT:[id number] DOB: October 30, 1945 DOA: 02/15/2020 PCP: [name], MD   Brief Narrative: 74 year old with past medical history significant for ESRD on hemodialysis MWF, Crohn's disease on Humira and prednisone, anemia of chronic disease, thrombocytopenia, lung mass, chronic hypoxic respiratory failure secondary to underlying COPD on 3 L of oxygen at home, PE DVT on Eliquis, hypertension, hyperlipidemia, paroxysmal A. fib who presents to the emergency department with worsening headache since 1 week. Evaluation in the ED raise concern for brain mets.  Medical oncology, pulmonology has been consulted. MRI showed; interval development of multifocal area of T2 flair hyperintensity involving both cerebral and cerebellar hemisphere, consistent with vasogenic edema related to new metastatic disease.   Assessment & Plan:   Principal Problem:   Headache Active Problems:   ESRD on dialysis (HCC)   Hypothyroidism   Thrombocytopenia (HCC)   COPD (chronic obstructive pulmonary disease) (HCC)   Chronic respiratory failure with hypoxia (HCC)   Metastatic lung carcinoma, left (HCC)   Acute blood loss anemia   GERD (gastroesophageal reflux disease)   Metastasis to brain [facility])   Brain lesion  1-Brain metastasis/headache: -Likely primary is lung.  Patient is known to have a left lung mass. -Oncology has been consulted.  Radiation oncology and pulmonology has been also consulted. -Patient was a started on dexamethasone. -Awaiting decision from patient's in regards future evaluation for lung mass  2-Lung mass/concern for primary lung cancer now with possible mets to the brain; Patient noted to have area of concern in the left lung. Bronchoscopy was discussed in the past with patient but she declined. Neurology was consulted and is awaiting patient decision in regards bronchoscopy  3-Acute on chronic anemia/chronic GI blood loss No active bleeding noted.   Gastroenterology was consulted.  No plans for any studies at this time Eliquis has been placed on hold. Patient received blood transfusion with hemodialysis Hemoglobin decreased to 6.9.  Posttransfusion increased to 8.4  4-ESRD on hemodialysis on MWF  Nephrology is following Chronic hypotension: Continue with midodrine.  5-Chronic respiratory failure with hypoxia secondary to COPD: She uses 3 to 4 L of oxygen at home. Stable from respiratory standpoint  6-History of Crohn's disease: She is status post surgery previously. Recent endoscopy and colonoscopy. Currently on dexamethasone  7-Paroxysmal A. Fib: With Cardizem.  Holding Eliquis due to concern for bleed and possible need for procedure  8-Hypothyroidism: Continue with Synthroid  9-History of PE DVT: Appears to be remote holding anticoagulation as discussed   Estimated body mass index is 20.25 kg/m as calculated from the following:   Height as of this encounter: 5' 4"  (1.626 m).   Weight as of this encounter: 53.5 kg.   DVT prophylaxis: SCDs Code Status: DNR Family Communication: Care discussed with patient Disposition Plan:  Status is: Inpatient  Remains inpatient appropriate because:Ongoing diagnostic testing needed not appropriate for outpatient work up   Dispo: The patient is from: Home              Anticipated d/c is to: To be determined              Anticipated d/c date is: 3 days              Patient currently is not medically stable to d/c.        Consultants:   Oncology  Pulmonology    Procedures:   Dialysis  Antimicrobials:  None  Subjective: Patient is alert and conversant.  She will  have a meeting this afternoon with palliative care and her husband She is feeling better, headache is better. Objective: Vitals:   02/17/20 1030 02/17/20 1040 02/17/20 1045 02/17/20 1112  BP: 90/60 124/60 139/69 (!) 117/59  Pulse: 83 83 83 74  Resp:   18 18  Temp:   98.5 F (36.9 C) 98.3 F (36.8  C)  TempSrc:   Oral Oral  SpO2:   99% 100%  Weight:      Height:        Intake/Output Summary (Last 24 hours) at 02/17/2020 1338 Last data filed at 02/17/2020 1045 Gross per 24 hour  Intake 435 ml  Output 2209 ml  Net -1774 ml   Filed Weights   02/16/20 1305 02/16/20 1740 02/17/20 0454  Weight: 55.1 kg 53.5 kg 53.5 kg    Examination:  General exam: Appears calm and comfortable  Respiratory system: Clear to auscultation. Respiratory effort normal. Cardiovascular system: S1 & S2 heard, RRR. No JVD, murmurs, rubs, gallops or clicks. No pedal edema. Gastrointestinal system: Abdomen is nondistended, soft and nontender. No organomegaly or masses felt. Normal bowel sounds heard. Central nervous system: Alert and oriented. . Extremities: Symmetric 5 x 5 power. Skin: No rashes, lesions or ulcers   Data Reviewed: I have personally reviewed following labs and imaging studies  CBC: Recent Labs  Lab 02/15/20 1110 02/16/20 0953 02/17/20 0308  WBC 6.3 5.3 3.5*  HGB 7.1* 6.9* 8.4*  HCT 24.2* 22.9* 26.6*  MCV 103.9* 101.3* 94.0  PLT 145* 138* 768*   Basic Metabolic Panel: Recent Labs  Lab 02/15/20 1110 02/15/20 1410 02/16/20 0500 02/17/20 0308  NA 136  --  138 136  K 3.6  --  4.1 3.4*  CL 103  --  107 98  CO2 18*  --  14* 24  GLUCOSE 75  --  43* 113*  BUN 40*  --  46* 22  CREATININE 9.30*  --  10.76* 5.39*  CALCIUM 8.6*  --  8.5* 8.6*  MG  --  1.6*  --   --   PHOS  --  8.2*  --   --    GFR: Estimated Creatinine Clearance: 7.9 mL/min (A) (by C-G formula based on SCr of 5.39 mg/dL (H)). Liver Function Tests: Recent Labs  Lab 02/15/20 1110 02/16/20 0500  AST 18 20  ALT 14 16  ALKPHOS 41 44  BILITOT 0.8 1.2  PROT 6.4* 6.0*  ALBUMIN 3.2* 2.9*   No results for input(s): LIPASE, AMYLASE in the last 168 hours. No results for input(s): AMMONIA in the last 168 hours. Coagulation Profile: Recent Labs  Lab 02/15/20 1410  INR 1.1   Cardiac Enzymes: No results for  input(s): CKTOTAL, CKMB, CKMBINDEX, TROPONINI in the last 168 hours. BNP (last 3 results) No results for input(s): PROBNP in the last 8760 hours. HbA1C: No results for input(s): HGBA1C in the last 72 hours. CBG: Recent Labs  Lab 02/16/20 0710 02/16/20 2009 02/16/20 2300 02/17/20 0454 02/17/20 1115  GLUCAP 97 139* 162* 111* 71   Lipid Profile: No results for input(s): CHOL, HDL, LDLCALC, TRIG, CHOLHDL, LDLDIRECT in the last 72 hours. Thyroid Function Tests: No results for input(s): TSH, T4TOTAL, FREET4, T3FREE, THYROIDAB in the last 72 hours. Anemia Panel: Recent Labs    02/15/20 1410  VITAMINB12 1,050*  FOLATE 7.2  FERRITIN 1,316*  TIBC 270  IRON 85   Sepsis Labs: No results for input(s): PROCALCITON, LATICACIDVEN in the last 168 hours.  Recent Results (from the  past 240 hour(s))  SARS Coronavirus 2 by RT PCR (hospital order, performed in [facility] hospital lab) Nasopharyngeal Nasopharyngeal Swab     Status: None   Collection Time: 02/15/20  2:10 PM   Specimen: Nasopharyngeal Swab  Result Value Ref Range Status   SARS Coronavirus 2 NEGATIVE NEGATIVE Final    Comment: (NOTE) SARS-CoV-2 target nucleic acids are NOT DETECTED.  The SARS-CoV-2 RNA is generally detectable in upper and lower respiratory specimens during the acute phase of infection. The lowest concentration of SARS-CoV-2 viral copies this assay can detect is 250 copies / mL. A negative result does not preclude SARS-CoV-2 infection and should not be used as the sole basis for treatment or other patient management decisions.  A negative result may occur with improper specimen collection / handling, submission of specimen other than nasopharyngeal swab, presence of viral mutation(s) within the areas targeted by this assay, and inadequate number of viral copies (<250 copies / mL). A negative result must be combined with clinical observations, patient history, and epidemiological information.  Fact Sheet  for Patients:   StrictlyIdeas.no  Fact Sheet for Healthcare Providers: BankingDealers.co.za  This test is not yet approved or  cleared by the [place] FDA and has been authorized for detection and/or diagnosis of SARS-CoV-2 by FDA under an Emergency Use Authorization (EUA).  This EUA will remain in effect (meaning this test can be used) for the duration of the COVID-19 declaration under Section 564(b)(1) of the Act, 21 U.S.C. section 360bbb-3(b)(1), unless the authorization is terminated or revoked sooner.  Performed at [facility], [place] [address], [place], [place] [address]          Radiology Studies: MR BRAIN WO CONTRAST  Result Date: 02/16/2020 CLINICAL DATA:  Initial evaluation for possible brain mass. EXAM: MRI HEAD WITHOUT CONTRAST TECHNIQUE: Multiplanar, multiecho pulse sequences of the brain and surrounding structures were obtained without intravenous contrast. COMPARISON:  Comparison made with prior CT from earlier same day as well as recent brain MRI from 12/01/2019. FINDINGS: Brain: There has been interval development of multifocal areas of T2/FLAIR hyperintensity involving both cerebral and cerebellar hemispheres, consistent with vasogenic edema related to new metastatic disease. These foci are predominantly positioned along the gray-white matter differentiation. Largest area within the left cerebral hemisphere seen at the anterior left frontal lobe and measures 18 mm (series 11, image 17). Largest area within the right cerebral hemisphere seen at the right temporal occipital junction and measures 2.3 cm (series 11, image 12). Largest infratentorial area seen within r the right cerebellum and measures 1.7 cm. No significant regional mass effect about any of these foci at this time. There are at least 10 lesions in total. Evaluation for underlying lesion size limited given lack of IV contrast, although 1 lesion positioned  at the subcortical right parietal lobe measures 5 mm, measured well on T2 weighted sequence (series 10, image 16). Few scattered foci of susceptibility artifacts seen associated with these lesions, consistent with necrosis and/or hemorrhage. Underlying atrophy with chronic microvascular ischemic disease again noted. No evidence for acute or subacute infarct. Gray-white matter differentiation otherwise maintained. Ventricles within normal limits without hydrocephalus. No extra-axial fluid collection. Pituitary gland and suprasellar region within normal limits. Midline structures intact. Vascular: Major intracranial vascular flow voids are maintained. Skull and upper cervical spine: Craniocervical junction within normal limits. Bone marrow signal intensity normal. No focal marrow replacing lesion. No scalp soft tissue abnormality. Sinuses/Orbits: Patient status post bilateral ocular lens replacement. Paranasal sinuses  are largely clear. No significant mastoid effusion. Other: None. IMPRESSION: Interval development of multifocal areas of T2/FLAIR hyperintensity involving both cerebral and cerebellar hemispheres, consistent with vasogenic edema related to new metastatic disease. No significant regional mass effect at this time. Follow-up examination with postcontrast imaging could be performed for complete assessment as clinically warranted. Please note that the risk of NSF is considered to be negligible with the use of group II agents (Gadavist). Electronically Signed   By: Jeannine Boga M.D.   On: 02/16/2020 01:05        Scheduled Meds: . sodium chloride   Intravenous Once  . calcitRIOL      . calcitRIOL  0.25 mcg Oral Q M,W,F-HD  . calcium acetate  667 mg Oral TID WC  . Chlorhexidine Gluconate Cloth  6 each Topical Q0600  . dexamethasone (DECADRON) injection  4 mg Intravenous Q6H  . diltiazem  30 mg Oral Q8H  . fluticasone furoate-vilanterol  1 puff Inhalation Daily   And  . umeclidinium  bromide  1 puff Inhalation Daily  . levothyroxine  125 mcg Oral Q0600  . metoprolol tartrate  25 mg Oral BID  . midodrine  10 mg Oral TID WC  . pantoprazole  40 mg Oral BID   Continuous Infusions:   LOS: 2 days    Time spent: 35 minutes.     Elmarie Shiley, MD [facility]   If 7PM-7AM, please contact night-coverage [email or web address]  02/17/2020, 1:38 PM

## 2020-02-17 NOTE — Consult Note (Addendum)
Palliative Medicine Inpatient Consult Note  Reason for consult:  GOC Discussion  HPI:  Per intake H&P --> Natasha [name] is a 74 y.o. 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 fib presents to emergency department with worsening headache since 1 week.  Palliative care was asked to get involved to aid on [place] conversations.  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 Patient herself has some apprehensions about bronchoscopy for tissue diagnosis.   Clinical Assessment/Goals of Care: I have reviewed medical records including EPIC notes, labs and imaging, received report from bedside RN, assessed the patient who is alert and oriented.    I met with [name] and Aundreya Souffrant to further discuss diagnosis prognosis, GOC, EOL wishes, disposition and options.   I introduced Palliative Medicine as specialized medical care for people living with serious illness. It focuses on providing relief from the symptoms and stress of a serious illness. The goal is to improve quality of life for both the patient and the family.  I asked Natasha [name] to tell me about herself. She shares that she is from [place]. She has lived in [place] for the greater portion of her life. She has been married to her husband, Verdia [name] for the past 40 five years. She has three children two sons and a daughter. 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 She enjoys cooking, spending time with her family, and sewing garments. She is a faithful woman and practices within the [facility]  denomination.   In terms of mobility [name] shares that she has been able to participate in all bADLs. She has been able to bath, dress herself, cook and feed herself. She attests to needing a walker and wishing to purchase a rollator specifically.   I asked Natasha [name] what she understood about her illness - she shares that she just learned of her likely cancer on September 6th. She expresses that prior to to that her lung had an abnormality but nothing was ever thought of it and it was surveillanced regularly. She expressed surprise when multiple parts of her lung were identified on imaging. She shares that most recently she was having headaches and that she was not expecting to have "cancer to the brain".   [name] shares that the idea of getting tissue "down her lung" is scary. Her brother died of complications of lung cancer after having gotten a bronchoscopy and she correlates her experience directly with that one. Allowed her time to express her feeling. He husband, Natasha [name] shares that at least if they got the tissue sample they would know if it could be treated.  [name] states that she has some great quality in her life - she still gets enjoyment out of "[name] things". She states that she is not sure she is ready for hospice. I was able to review and explain the hospice benefit to Natasha [name] and Natasha [name]. I shared that on hospice she would need to stop hemodialysis and this would make her life expectancy shorter we talked about 10-14 days in duration. I shared that hospice shifts the focus from curative to symptom based. Natasha [name] expresses apprehensions regarding this - she asked about  other options. We talked about outpatient Palliative support which is fairly minimal though it would establish an ongoing dialogue about goals and symptom management. She was in favor of this.   Vaishali and her husband alike are interested in knowing more about her cancer and what treatment options would be available.   A detailed  discussion was had today regarding advanced directives - there are none on file.  Patient would defer to her husband Natasha [name] to make decisions for her if needed.  The difference between a aggressive medical intervention path  and a palliative comfort care path for this patient at this time was had. I completed a MOST form today. The patient and family outlined their wishes for the following treatment decisions:  Cardiopulmonary Resuscitation: Do Not Attempt Resuscitation (DNR/No CPR)  Medical Interventions: Limited Additional Interventions: Use medical treatment, IV fluids and cardiac monitoring as indicated, DO NOT USE intubation or mechanical ventilation. May consider use of less invasive airway support such as BiPAP or CPAP. Also provide comfort measures. Transfer to the hospital if indicated. Avoid intensive care.   Antibiotics: Antibiotics if indicated  IV Fluids: IV fluids if indicated  Feeding Tube: Feeding tube for a defined trial period   Discussed the importance of continued conversation with family and their  medical providers regarding overall plan of care and treatment options, ensuring decisions are within the context of the patients values and GOCs.  Decision Maker: Patient can make decisions for herself.  SUMMARY OF RECOMMENDATIONS   DNAR/DNI  MOST Completed, paper copy placed onto the chart electric copy can be found in [facility]  DNR Form Completed, paper copy placed onto the chart electric copy can be found in [facility]  Patient would like to pursue bronchoscopy with biopsy - she is interested in knowing what treatments are able to be offerred  TOC --> OP Palliative Care. Patient shares that she is not yet ready for hospice  CM - Help arrange for patient to get rollator  Spiritual support  Code Status/Advance Care Planning: DNAR/DNI   Symptom Management:  Agree with dexamethasone for vasogenic edema   Palliative Prophylaxis:   Oral Care, Mobility, Delirium    Additional Recommendations (Limitations, Scope, Preferences):  Treat what is treatable   Psycho-social/Spiritual:   Desire for further Chaplaincy support: Yes  Additional Recommendations: Education on supportive care and hospice   Prognosis: Poor in the setting of metastatic disease  Discharge Planning: Unclear   PPS: 50-60%   This conversation/these recommendations were discussed with patient primary care team, Dr. Tyrell Antonio  Time In: 1300 Time Out: 1430 Total Time: 90 Greater than 50%  of this time was spent counseling and coordinating care related to the above assessment and plan.  [place] Team Team Cell Phone: [phone] Please utilize secure chat with additional questions, if there is no response within 30 minutes please call the above phone number  Palliative Medicine Team providers are available by phone from 7am to 7pm daily and can be reached through the team cell phone.  Should this patient require assistance outside of these hours, please call the patient's attending physician.

## 2020-02-18 DIAGNOSIS — R918 Other nonspecific abnormal finding of lung field: Secondary | ICD-10-CM | POA: Insufficient documentation

## 2020-02-18 LAB — BASIC METABOLIC PANEL
Anion gap: 11 (ref 5–15)
BUN: 29 mg/dL — ABNORMAL HIGH (ref 8–23)
CO2: 24 mmol/L (ref 22–32)
Calcium: 8.8 mg/dL — ABNORMAL LOW (ref 8.9–10.3)
Chloride: 102 mmol/L (ref 98–111)
Creatinine, Ser: 5.26 mg/dL — ABNORMAL HIGH (ref 0.44–1.00)
GFR calc Af Amer: 9 mL/min — ABNORMAL LOW (ref >60)
GFR calc non Af Amer: 8 mL/min — ABNORMAL LOW (ref >60)
Glucose, Bld: 114 mg/dL — ABNORMAL HIGH (ref 70–99)
Potassium: 4.1 mmol/L (ref 3.5–5.1)
Sodium: 137 mmol/L (ref 135–145)

## 2020-02-18 LAB — CBC
HCT: 26.6 % — ABNORMAL LOW (ref 36.0–46.0)
Hemoglobin: 8.1 g/dL — ABNORMAL LOW (ref 12.0–15.0)
MCH: 29.5 pg (ref 26.0–34.0)
MCHC: 30.5 g/dL (ref 30.0–36.0)
MCV: 96.7 fL (ref 80.0–100.0)
Platelets: 152 10*3/uL (ref 150–400)
RBC: 2.75 MIL/uL — ABNORMAL LOW (ref 3.87–5.11)
RDW: 19.8 % — ABNORMAL HIGH (ref 11.5–15.5)
WBC: 8.6 10*3/uL (ref 4.0–10.5)
nRBC: 0 % (ref 0.0–0.2)

## 2020-02-18 MED ORDER — PROMETHAZINE HCL 25 MG/ML IJ SOLN
12.5000 mg | Freq: Four times a day (QID) | INTRAMUSCULAR | Status: DC | PRN
  Administered 2020-02-18: 12.5 mg via INTRAVENOUS
  Filled 2020-02-18: qty 1

## 2020-02-18 NOTE — Progress Notes (Signed)
PROGRESS NOTE    Natasha [name]  AOZ:[id number] DOB: 07/16/1945 DOA: 02/15/2020 PCP: [name], MD   Brief Narrative: 74 year old with past medical history significant for ESRD on hemodialysis MWF, Crohn's disease on Humira and prednisone, anemia of chronic disease, thrombocytopenia, lung mass, chronic hypoxic respiratory failure secondary to underlying COPD on 3 L of oxygen at home, PE DVT on Eliquis, hypertension, hyperlipidemia, paroxysmal A. fib who presents to the emergency department with worsening headache since 1 week. Evaluation in the ED raise concern for brain mets.  Medical oncology, pulmonology has been consulted.  MRI showed; interval development of multifocal area of T2 flair hyperintensity involving both cerebral and cerebellar hemisphere, consistent with vasogenic edema related to new metastatic disease.   Assessment & Plan:   Principal Problem:   Headache Active Problems:   ESRD on dialysis (HCC)   Hypothyroidism   Thrombocytopenia (HCC)   COPD (chronic obstructive pulmonary disease) (HCC)   Chronic respiratory failure with hypoxia (HCC)   Metastatic lung carcinoma, left (HCC)   Acute blood loss anemia   GERD (gastroesophageal reflux disease)   Metastasis to brain [facility])   Brain lesion   Palliative care by specialist   DNR (do not resuscitate)  1-Brain metastasis/headache: -Likely primary is lung.  Patient is known to have a left lung mass. -Oncology has been consulted.  Radiation oncology and pulmonology has been also consulted. -Patient was a started on dexamethasone. -Plan to proceed with bronchoscopy out patient, tentatively schedule for 10/05. -Discussed with PA, Ashlyn Burning, patient need MRI brain with contrast 3 T, and after HD after contrast. Discussed with nephrology plan to keep patient over weekend to do MRI on Monday and HD after. I contacted MRI -radiology department and they made note to do MRI on Monday morning   2-Lung  mass/concern for primary lung cancer now with possible mets to the brain; Patient noted to have area of concern in the left lung. Bronchoscopy was discussed in the past with patient but she declined. Plan for bronchoscopy out patient.   3-Acute on chronic anemia/chronic GI blood loss No active bleeding noted.  Gastroenterology was consulted.  No plans for any studies at this time Eliquis has been placed on hold. Patient received blood transfusion with hemodialysis Hemoglobin decreased to 6.9.  Posttransfusion increased to 8.4 Hb stable.   4-ESRD on hemodialysis on MWF  Nephrology is following Chronic hypotension: Continue with midodrine.  5-Chronic respiratory failure with hypoxia secondary to COPD: She uses 3 to 4 L of oxygen at home. Stable from respiratory standpoint  6-History of Crohn's disease: She is status post surgery previously. Recent endoscopy and colonoscopy. Currently on dexamethasone  7-Paroxysmal A. Fib: With Cardizem.  Holding Eliquis due to concern for bleed and possible need for procedure  8-Hypothyroidism: Continue with Synthroid  9-History of PE DVT: Appears to be remote holding anticoagulation as discussed   Estimated body mass index is 21.08 kg/m as calculated from the following:   Height as of this encounter: 5' 4"  (1.626 m).   Weight as of this encounter: 55.7 kg.   DVT prophylaxis: SCDs Code Status: DNR Family Communication: Care discussed with patient Disposition Plan:  Status is: Inpatient  Remains inpatient appropriate because:Ongoing diagnostic testing needed not appropriate for outpatient work up   Dispo: The patient is from: Home              Anticipated d/c is to: To be determined  Anticipated d/c date is: 3 days              Patient currently is not medically stable to d/c.        Consultants:   Oncology  Pulmonology    Procedures:   Dialysis  Antimicrobials:  None  Subjective: Had headache last  night, required oxycodone.  Headache better this morning.  Had abdominal pain last night that got relieve after having a bowel movement.   Objective: Vitals:   02/17/20 2344 02/18/20 0332 02/18/20 0445 02/18/20 0820  BP: 130/71 126/69  (!) 111/56  Pulse: 67 67  (!) 58  Resp: 20 16  20   Temp: 98.2 F (36.8 C) 97.9 F (36.6 C)  98 F (36.7 C)  TempSrc: Oral Oral  Oral  SpO2: 100% 100%  100%  Weight:   55.7 kg   Height:        Intake/Output Summary (Last 24 hours) at 02/18/2020 1303 Last data filed at 02/17/2020 2134 Gross per 24 hour  Intake 240 ml  Output --  Net 240 ml   Filed Weights   02/16/20 1740 02/17/20 0454 02/18/20 0445  Weight: 53.5 kg 53.5 kg 55.7 kg    Examination:  General exam: NAD Respiratory system: CTA Cardiovascular system: S 1, S 2 RRR Gastrointestinal system: BS present, soft , nt Central nervous system: alert and oriented.  Extremities: symmetric power  Data Reviewed: I have personally reviewed following labs and imaging studies  CBC: Recent Labs  Lab 02/15/20 1110 02/16/20 0953 02/17/20 0308 02/18/20 0407  WBC 6.3 5.3 3.5* 8.6  HGB 7.1* 6.9* 8.4* 8.1*  HCT 24.2* 22.9* 26.6* 26.6*  MCV 103.9* 101.3* 94.0 96.7  PLT 145* 138* 143* 144   Basic Metabolic Panel: Recent Labs  Lab 02/15/20 1110 02/15/20 1410 02/16/20 0500 02/17/20 0308 02/18/20 0407  NA 136  --  138 136 137  K 3.6  --  4.1 3.4* 4.1  CL 103  --  107 98 102  CO2 18*  --  14* 24 24  GLUCOSE 75  --  43* 113* 114*  BUN 40*  --  46* 22 29*  CREATININE 9.30*  --  10.76* 5.39* 5.26*  CALCIUM 8.6*  --  8.5* 8.6* 8.8*  MG  --  1.6*  --   --   --   PHOS  --  8.2*  --   --   --    GFR: Estimated Creatinine Clearance: 8.2 mL/min (A) (by C-G formula based on SCr of 5.26 mg/dL (H)). Liver Function Tests: Recent Labs  Lab 02/15/20 1110 02/16/20 0500  AST 18 20  ALT 14 16  ALKPHOS 41 44  BILITOT 0.8 1.2  PROT 6.4* 6.0*  ALBUMIN 3.2* 2.9*   No results for input(s):  LIPASE, AMYLASE in the last 168 hours. No results for input(s): AMMONIA in the last 168 hours. Coagulation Profile: Recent Labs  Lab 02/15/20 1410  INR 1.1   Cardiac Enzymes: No results for input(s): CKTOTAL, CKMB, CKMBINDEX, TROPONINI in the last 168 hours. BNP (last 3 results) No results for input(s): PROBNP in the last 8760 hours. HbA1C: No results for input(s): HGBA1C in the last 72 hours. CBG: Recent Labs  Lab 02/16/20 2300 02/17/20 0454 02/17/20 1115 02/17/20 1742 02/17/20 2026  GLUCAP 162* 111* 71 121* 117*   Lipid Profile: No results for input(s): CHOL, HDL, LDLCALC, TRIG, CHOLHDL, LDLDIRECT in the last 72 hours. Thyroid Function Tests: No results for input(s): TSH, T4TOTAL, FREET4, T3FREE,  THYROIDAB in the last 72 hours. Anemia Panel: Recent Labs    02/15/20 1410  VITAMINB12 1,050*  FOLATE 7.2  FERRITIN 1,316*  TIBC 270  IRON 85   Sepsis Labs: No results for input(s): PROCALCITON, LATICACIDVEN in the last 168 hours.  Recent Results (from the past 240 hour(s))  SARS Coronavirus 2 by RT PCR (hospital order, performed in [facility] hospital lab) Nasopharyngeal Nasopharyngeal Swab     Status: None   Collection Time: 02/15/20  2:10 PM   Specimen: Nasopharyngeal Swab  Result Value Ref Range Status   SARS Coronavirus 2 NEGATIVE NEGATIVE Final    Comment: (NOTE) SARS-CoV-2 target nucleic acids are NOT DETECTED.  The SARS-CoV-2 RNA is generally detectable in upper and lower respiratory specimens during the acute phase of infection. The lowest concentration of SARS-CoV-2 viral copies this assay can detect is 250 copies / mL. A negative result does not preclude SARS-CoV-2 infection and should not be used as the sole basis for treatment or other patient management decisions.  A negative result may occur with improper specimen collection / handling, submission of specimen other than nasopharyngeal swab, presence of viral mutation(s) within the areas targeted by  this assay, and inadequate number of viral copies (<250 copies / mL). A negative result must be combined with clinical observations, patient history, and epidemiological information.  Fact Sheet for Patients:   StrictlyIdeas.no  Fact Sheet for Healthcare Providers: BankingDealers.co.za  This test is not yet approved or  cleared by the [place] FDA and has been authorized for detection and/or diagnosis of SARS-CoV-2 by FDA under an Emergency Use Authorization (EUA).  This EUA will remain in effect (meaning this test can be used) for the duration of the COVID-19 declaration under Section 564(b)(1) of the Act, 21 U.S.C. section 360bbb-3(b)(1), unless the authorization is terminated or revoked sooner.  Performed at [facility], [place] [address] [place], [place] [address]          Radiology Studies: CT Super D Chest Wo Contrast  Result Date: 02/17/2020 CLINICAL DATA:  Left upper lobe lung mass preop for EBUS. EXAM: CT CHEST WITHOUT CONTRAST TECHNIQUE: Multidetector CT imaging of the chest was performed using thin slice collimation for electromagnetic bronchoscopy planning purposes, without intravenous contrast. COMPARISON:  Chest CT 11/18/2019 FINDINGS: Cardiovascular: The heart is normal in size. No pericardial effusion. There is mild tortuosity and moderate calcification of the thoracic aorta which is stable. Enlarged pulmonary artery suggesting pulmonary hypertension. Mediastinum/Nodes: Small scattered mediastinal and hilar lymph nodes appears stable. No obvious adenopathy on this noncontrast examination. The esophagus is grossly normal. Lungs/Pleura: Stable severe emphysematous changes and hit stable areas of pulmonary scarring. The left upper lobe lung mass measures 3.3 x 3.0 cm and previously measured 2.2 x 2.0 cm. More superiorly in the left upper lobe there is a stable 6 mm nodule. Subpleural nodular lesion in the left upper  lobe on image 73/4 measures 6 mm and appears stable. Upper Abdomen: No significant upper abdominal findings. Musculoskeletal: No significant bony findings. IMPRESSION: 1. Interval enlargement of the left upper lobe lung mass, now measuring 3.3 x 3.0 cm. 2. Stable 6 mm left upper lobe pulmonary nodules. 3. Stable severe emphysematous changes and pulmonary scarring. 4. Enlarged pulmonary artery suggesting pulmonary hypertension. 5. Stable atherosclerotic calcifications involving the thoracic aorta and branch vessels. 6. Emphysema and aortic atherosclerosis. Aortic Atherosclerosis (ICD10-I70.0) and Emphysema (ICD10-J43.9). Electronically Signed   By: Marijo Sanes M.D.   On: 02/17/2020 18:44  Scheduled Meds:  sodium chloride   Intravenous Once   calcitRIOL  0.25 mcg Oral Q M,W,F-HD   calcium acetate  667 mg Oral TID WC   Chlorhexidine Gluconate Cloth  6 each Topical Q0600   dexamethasone (DECADRON) injection  4 mg Intravenous Q8H   diltiazem  30 mg Oral Q8H   fluticasone furoate-vilanterol  1 puff Inhalation Daily   And   umeclidinium bromide  1 puff Inhalation Daily   levothyroxine  125 mcg Oral Q0600   metoprolol tartrate  25 mg Oral BID   midodrine  10 mg Oral TID WC   pantoprazole  40 mg Oral BID   Continuous Infusions:   LOS: 3 days    Time spent: 35 minutes.     Elmarie Shiley, MD [facility]   If 7PM-7AM, please contact night-coverage [email or web address]  02/18/2020, 1:03 PM

## 2020-02-18 NOTE — TOC Transition Note (Signed)
Transition of Care [facility]) - CM/SW Discharge Note   Patient Details  Name: Natasha [name] MRN: [id number] Date of Birth: 1946/01/04  [facility] of [facility] CM/SW Contact:  Pollie Friar, RN Phone Number: 02/18/2020, 4:37 PM   Clinical Narrative:    Pt with orders for [facility] services. CM met with the patient and [facility] decided on . Amy with [facility] accepted the referral.  3 in 1/ rollator to be delivered to the room per [facility].  Palliative care arranged through [facility]. Pt has supervision at home and transportation to home when medically ready.   Final next level of care: Home w Home Health Services Barriers to Discharge: No Barriers Identified   Patient Goals and CMS Choice   CMS Medicare.gov Compare Post Acute Care list provided to:: Patient Choice offered to / list presented to : Patient  Discharge Placement                       Discharge Plan and Services   Discharge Planning Services: CM Consult Post Acute Care Choice: Durable Medical Equipment          DME Arranged: 3-N-1, Walker rolling with seat DME Agency: [facility] Date DME Agency Contacted: 02/18/20   Representative spoke with at DME Agency: Peggye [employer] HH Arranged: PT, OT HH Agency: [facility] [place] Date [place]: 02/18/20   Representative spoke with at [place]: [place] ([place]) Interventions     Readmission Risk Interventions Readmission Risk Prevention Plan 04/15/2019  Transportation Screening Complete  Medication Review Press photographer) Complete  [place] Not Applicable  Some recent data might be hidden

## 2020-02-18 NOTE — Progress Notes (Signed)
Paged on call Dr. Myna Hidalgo after pt still c/o nausea after zofran administration.

## 2020-02-18 NOTE — Progress Notes (Signed)
Occupational Therapy Evaluation Patient Details Name: Natasha [name] MRN: [id number] DOB: [birth year] Today's Date: 02/18/2020    History of Present Illness 74 y.o. 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 fib presents to emergency department with worsening headache since 1 week.   Clinical Impression   Patient presenting with decreased I in self care, balance, functional transfer/mobility, endurance, safety awareness, and strength. Patient reports being independent PTA and living at home with her husband. She verbalized having family support at discharge. Patient currently functioning at supervision level overall. Patient will benefit from acute OT to increase overall independence in the areas of ADLs, functional mobility, and safety awareness in order to safely discharge home with caregivers.    Follow Up Recommendations  Home health OT;Supervision - Intermittent    Equipment Recommendations  Tub/shower bench;3 in 1 bedside commode       Precautions / Restrictions Precautions Precautions: None      Mobility Bed Mobility Overal bed mobility: Modified Independent       General bed mobility comments: use of bed rail  Transfers Overall transfer level: Needs assistance Equipment used: None Transfers: Sit to/from Stand;Stand Pivot Transfers Sit to Stand: Supervision Stand pivot transfers: Supervision       General transfer comment: supervision for O2 line management    Balance Overall balance assessment: Mild deficits observed, not formally tested          ADL either performed or assessed with clinical judgement   ADL Overall ADL's : Needs  assistance/impaired     Grooming: Supervision/safety;Standing;Oral care;Wash/dry face   Upper Body Bathing: Set up;Sitting   Lower Body Bathing: Supervison/ safety;Sit to/from stand;Set up   Upper Body Dressing : Set up;Sitting   Lower Body Dressing: Set up;Sitting/lateral leans;Supervision/safety   Toilet Transfer: Supervision/safety             General ADL Comments: Pt overall supervision - set up A for self care tasks and functional transfer this session.     Vision Baseline Vision/History: No visual deficits Patient Visual Report: No change from baseline              Pertinent Vitals/Pain Pain Assessment: No/denies pain     Hand Dominance Right   Extremity/Trunk Assessment Upper Extremity Assessment Upper Extremity Assessment: Overall WFL for tasks assessed   Lower Extremity Assessment Lower Extremity Assessment: Overall WFL for tasks assessed   Cervical / Trunk Assessment Cervical / Trunk Assessment: Normal   Communication Communication Communication: No difficulties   Cognition Arousal/Alertness: Awake/alert Behavior During Therapy: WFL for tasks assessed/performed Overall Cognitive Status: Within Functional Limits for tasks assessed                    Home Living Family/patient expects to be discharged to:: Private residence Living Arrangements: Spouse/significant other Available Help at Discharge: Family;Available 24 hours/day Type of Home: House Home Access: Level entry     Home Layout: One level     Bathroom Shower/Tub: Teacher, early years/pre: Standard     Home Equipment: Environmental consultant - 2 wheels;Cane - single point          Prior Functioning/Environment Level of Independence: Needs assistance  Gait / Transfers Assistance Needed: does not use AD ADL's / Homemaking  Assistance Needed: Independent ADL's, assist for IADL's   Comments: Pt on 3-4L home O2 at baseline, stands to shower (does not wear her 02 in shower), works  with husband to complete IADL.        OT Problem List: Decreased activity tolerance;Decreased knowledge of use of DME or AE;Decreased strength;Cardiopulmonary status limiting activity;Decreased safety awareness;Impaired balance (sitting and/or standing)      OT Treatment/Interventions: Self-care/ADL training;DME and/or AE instruction;Patient/family education;Energy conservation;Therapeutic activities;Therapeutic exercise;Balance training    OT Goals(Current goals can be found in the care plan section) Acute Rehab OT Goals Patient Stated Goal: go home OT Goal Formulation: With patient Time For Goal Achievement: 03/03/20 Potential to Achieve Goals: Good ADL Goals Pt Will Perform Grooming: with modified independence;standing Pt Will Transfer to Toilet: with modified independence;ambulating Pt Will Perform Toileting - Clothing Manipulation and hygiene: with modified independence;sit to/from stand Pt Will Perform Tub/Shower Transfer: Tub transfer;with modified independence;tub bench;ambulating  OT Frequency: Min 2X/week   Barriers to D/C: Other (comment)  none known at this time          AM-PAC OT "6 Clicks" Daily Activity     Outcome Measure Help from another person eating meals?: None Help from another person taking care of personal grooming?: None Help from another person toileting, which includes using toliet, bedpan, or urinal?: A Little Help from another person bathing (including washing, rinsing, drying)?: A Little Help from another person to put on and taking off regular upper body clothing?: None Help from another person to put on and taking off regular lower body clothing?: A Little 6 Click Score: 21   End of Session Equipment Utilized During Treatment: Oxygen (3L via [place]) Nurse Communication: Mobility status  Activity Tolerance: Patient tolerated treatment well Patient left: with call bell/phone within reach;with nursing/sitter in room;in chair;with chair alarm  set  OT Visit Diagnosis: Muscle weakness (generalized) (M62.81);Other (comment) (cardiopulmonary status)                Time: 3491-7915 OT Time Calculation (min): 28 min Charges:  OT General Charges $OT Visit: 1 Visit OT Evaluation $OT Eval Low Complexity: 1 Low OT Treatments $Self Care/Home Management : 8-22 mins  Darleen Crocker, MS, OTR/L , CBIS ascom [phone]  02/18/20, 10:52 AM

## 2020-02-18 NOTE — Progress Notes (Signed)
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 fib presents to emergency department with worsening headache since 1 week.  Palliative care was asked to get involved to aid on [place] conversations.  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 Patient herself has some apprehensions about bronchoscopy for tissue diagnosis.   Today's Discussion (02/18/2020): Chart reviewed. Plan for bronchoscopy with tissue biopsy on 10/5. Natasha [name] shares that her night was complicated by some significant headaches she shares that the tylenol given initially did not help it was not until she received oxycodone that she felt some relief. She also complained of nausea which was remedied will with zofran. Patient endorses having a large BM overnight which "helped" her nausea. Reports that she will work with PT today for her "wobbly" gait and hopes to get a rollator for home.  Natasha [name] and I talked for quite sometime about her grandchildren and the joy they bring to her life. She expresses that she knows she will not be here forever - expresses that she and her daughter have been talking regularly about when is no longer here how her husband, [name] will be cared for as he presently lives in a seven bedroom house. Natasha [name] shares that she is more calm about the realities of the future whether they be favorable or otherwise. Allowed time for her to express feelings. Provided a safe space to express her fears which consisted primarily of how her husband will survive once  she is gone. Shared that we will continue to check in regularly.   Discussed the importance of continued conversation with family and their  medical providers regarding overall plan of care and treatment options, ensuring decisions are within the context of the patients values and GOCs.  Questions and concerns addressed   Subjective Assessment: Patient is sitting up in bed states that she had a headache overnight though does not feel any pain now nor does she feel nauseated which she also endorsed having overnight.   Vital Signs Vitals:   02/18/20 0332 02/18/20 0820  BP: 126/69 (!) 111/56  Pulse: 67 (!) 58  Resp: 16 20  Temp: 97.9 F (36.6 C) 98 F (36.7 C)  SpO2: 100% 100%    Intake/Output Summary (Last 24 hours) at 02/18/2020 0847 Last data filed at 02/17/2020 2134 Gross per 24 hour  Intake 240 ml  Output 609 ml  Net -369 ml   Last Weight  Most recent update: 02/18/2020  4:47 AM   Weight  55.7 kg (122 lb 12.7 oz)           Gen:  NAD HEENT: moist mucous membranes CV: Regular rate and rhythm, no murmurs rubs or gallops PULM: clear to auscultation bilaterally. No wheezes/rales/rhonchi  ABD: soft/nontender/nondistended/normal bowel sounds  EXT: No edema  Neuro: Alert and oriented x4  SUMMARY OF RECOMMENDATIONS DNAR/DNI  MOST Completed, paper copy placed onto the chart electric copy can be found in Vyn[facility]  DNR Form Completed, paper copy placed onto the chart electric copy can be found in Vyn[facility]  Patient would like to pursue bronchoscopy with biopsy - she is interested in  knowing what treatments are able to be offered thereafter  OP Palliative Care. Patient shares that she is not yet ready for hospice  CM - Help arrange for patient to get rollator - Patient will need PT evaluation first on schedule today  Spiritual support  Symptoms:  HA - Oxycodone works effectively would continue PRN and prescribe upon discharge  Nausea: Continue zofran  Generalized  Weakness: PT/OT Eval - will likely need and benefit from [facility]  Time Spent: 35 Greater than 50% of the time was spent in counseling and coordination of care ______________________________________________________________________________________ [place] Team Team Cell Phone: [phone] Please utilize secure chat with additional questions, if there is no response within 30 minutes please call the above phone number  Palliative Medicine Team providers are available by phone from 7am to 7pm daily and can be reached through the team cell phone.  Should this patient require assistance outside of these hours, please call the patient's attending physician.

## 2020-02-18 NOTE — Progress Notes (Signed)
[facility] Progress Note   Subjective: seen in room, no new c/o.   Objective Vitals:   02/17/20 2344 02/18/20 0332 02/18/20 0445 02/18/20 0820  BP: 130/71 126/69  (!) 111/56  Pulse: 67 67  (!) 58  Resp: 20 16  20   Temp: 98.2 F (36.8 C) 97.9 F (36.6 C)  98 F (36.7 C)  TempSrc: Oral Oral  Oral  SpO2: 100% 100%  100%  Weight:   55.7 kg   Height:       General: Thin, elderly female no acute distress. Neuro: Alert, oriented X 3. Nonfocal.  Lungs: Clear bilaterally to auscultation  Heart: RRR with S1 S2. No murmurs, rubs, or gallops appreciated. Abdomen: Soft, non-tender, non-distended  Lower extremities: 1+ pitting edema posterior calves, ankles Dialysis Access: L AVG + bruit   Dialysis: [facility] MWF 4 hrs   300/800   49.5kg   3.0 K/ 2.25 Ca  UFP 3   Hep none L AVG -Hectorol 3 mcg IV TIW -No ESA D/T Lung mass/possible malignancy.    Assessment/Plan: 1.  Acute on Chronic Anemia-  FOBT +. HGB 6' > 8's after transfusion. Per pmd.  2. 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 Needs MRI for xrt staging I believe, if needs gadolinium would keep her in hospital. She would need HD the day of the procedure and also again the next day to remove the contrast.  3. Chronic hypoxic resp failure - seen by Pulm, outpt f/u for diag procedure 4.  ESRD -  MWF via AVG. HD Monday (after MRI, see above).  5.  Hypertension/volume  - continue Midodrine 10 mg PO TIW. Up 6kg , exam not consistent w/ the weights. Get stand wt.  6.  Anemia  - As noted above. H/O Crohn's disease  7.  Metabolic bone disease -C Ca OK. Continue VDRA, binders.  8. Severe COPD / emphysema 9.  Nutrition -Renal diet with fld restrictions. Albumin low, add protein supps, renal vits.  10.  COPD- per primary 11.  Chron's disease- per primary 12. DNR - palliative team is going to be involved  Kelly Splinter, MD 02/18/2020, 12:46 PM     Additional Objective Labs: Basic Metabolic Panel: Recent Labs   Lab 02/15/20 1110 02/15/20 1410 02/16/20 0500 02/17/20 0308 02/18/20 0407  NA   < >  --  138 136 137  K   < >  --  4.1 3.4* 4.1  CL   < >  --  107 98 102  CO2   < >  --  14* 24 24  GLUCOSE   < >  --  43* 113* 114*  BUN   < >  --  46* 22 29*  CREATININE   < >  --  10.76* 5.39* 5.26*  CALCIUM   < >  --  8.5* 8.6* 8.8*  PHOS  --  8.2*  --   --   --    < > = values in this interval not displayed.   Liver Function Tests: Recent Labs  Lab 02/15/20 1110 02/16/20 0500  AST 18 20  ALT 14 16  ALKPHOS 41 44  BILITOT 0.8 1.2  PROT 6.4* 6.0*  ALBUMIN 3.2* 2.9*   No results for input(s): LIPASE, AMYLASE in the last 168 hours. CBC: Recent Labs  Lab 02/15/20 1110 02/15/20 1110 02/16/20 0953 02/17/20 0308 02/18/20 0407  WBC 6.3   < > 5.3 3.5* 8.6  HGB 7.1*   < >  6.9* 8.4* 8.1*  HCT 24.2*   < > 22.9* 26.6* 26.6*  MCV 103.9*  --  101.3* 94.0 96.7  PLT 145*   < > 138* 143* 152   < > = values in this interval not displayed.    Medications:  . sodium chloride   Intravenous Once  . calcitRIOL  0.25 mcg Oral Q M,W,F-HD  . calcium acetate  667 mg Oral TID WC  . Chlorhexidine Gluconate Cloth  6 each Topical Q0600  . dexamethasone (DECADRON) injection  4 mg Intravenous Q8H  . diltiazem  30 mg Oral Q8H  . fluticasone furoate-vilanterol  1 puff Inhalation Daily   And  . umeclidinium bromide  1 puff Inhalation Daily  . levothyroxine  125 mcg Oral Q0600  . metoprolol tartrate  25 mg Oral BID  . midodrine  10 mg Oral TID WC  . pantoprazole  40 mg Oral BID

## 2020-02-18 NOTE — Progress Notes (Signed)
[facility])  Referral received for outpatient palliative care services once pt d/c back home.  [facility] will follow up with pt and family in the home.  Venia Carbon RN, BSN, [facility] Liaison

## 2020-02-18 NOTE — Evaluation (Signed)
Physical Therapy Evaluation Patient Details Name: Natasha [name] MRN: [id number] DOB: May 19, 1946 Today's Date: 02/18/2020   History of Present Illness  74 y.o. 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 fib presents to emergency department with worsening headache since 1 week.  Clinical Impression  Prior to admission, pt lives with her spouse and is independent. Presents with decreased functional mobility secondary to weakness, mild balance deficits, decreased endurance and decreased gait speed. Pt ambulating 80 feet with a Rollator at a min guard assist level. SpO2 99-100% on 3L O2. Reviewed Rollator management and use. Pt would benefit from HHPT to maximize functional independence and address deficits.     Follow Up Recommendations Home health PT    Equipment Recommendations  Other (comment) (Rollator)    Recommendations for Other Services       Precautions / Restrictions Precautions Precautions: Fall Restrictions Weight Bearing Restrictions: No      Mobility  Bed Mobility Overal bed mobility: Modified Independent                Transfers Overall transfer level: Needs assistance Equipment used: 4-wheeled walker Transfers: Sit to/from Stand Sit to Stand: Supervision         General transfer comment: Cues for locking Rollator prior to transition  Ambulation/Gait Ambulation/Gait assistance: Min guard Gait Distance (Feet): 80 Feet Assistive device: 4-wheeled walker Gait Pattern/deviations: Step-through pattern;Decreased stride length Gait velocity: decreased Gait velocity interpretation: <1.8 ft/sec, indicate of risk for recurrent  falls General Gait Details: Cues for walker proximity, slow pace, no gross imbalance  Stairs            Wheelchair Mobility    Modified Rankin (Stroke Patients Only)       Balance Overall balance assessment: Needs assistance Sitting-balance support: Feet supported Sitting balance-Leahy Scale: Good     Standing balance support: Bilateral upper extremity supported Standing balance-Leahy Scale: Poor                               Pertinent Vitals/Pain Pain Assessment: No/denies pain    Home Living Family/patient expects to be discharged to:: Private residence Living Arrangements: Spouse/significant other Available Help at Discharge: Family;Available 24 hours/day Type of Home: House Home Access: Level entry     Home Layout: One level Home Equipment: Walker - 2 wheels;Cane - single point      Prior Function Level of Independence: Needs assistance   Gait / Transfers Assistance Needed: does not use AD  ADL's / Homemaking Assistance Needed: Independent ADL's, assist for IADL's        Hand Dominance   Dominant Hand: Right    Extremity/Trunk Assessment   Upper Extremity Assessment Upper Extremity Assessment: Defer to OT evaluation    Lower Extremity Assessment Lower Extremity Assessment: Overall WFL for tasks assessed    Cervical / Trunk Assessment Cervical / Trunk Assessment: Normal  Communication   Communication: No difficulties  Cognition Arousal/Alertness: Awake/alert Behavior During Therapy: WFL for tasks assessed/performed Overall Cognitive Status: Within Functional Limits for tasks assessed  General Comments      Exercises     Assessment/Plan    PT Assessment Patient needs continued PT services  PT Problem List Decreased strength;Decreased mobility;Decreased range of motion;Decreased activity tolerance;Decreased balance       PT Treatment Interventions Therapeutic  exercise;Gait training;Stair training;Functional mobility training;Therapeutic activities;Patient/family education;Balance training    PT Goals (Current goals can be found in the Care Plan section)  Acute Rehab PT Goals Patient Stated Goal: go home PT Goal Formulation: With patient Time For Goal Achievement: 03/03/20 Potential to Achieve Goals: Good    Frequency Min 3X/week   Barriers to discharge        Co-evaluation               AM-PAC PT "6 Clicks" Mobility  Outcome Measure Help needed turning from your back to your side while in a flat bed without using bedrails?: None Help needed moving from lying on your back to sitting on the side of a flat bed without using bedrails?: None Help needed moving to and from a bed to a chair (including a wheelchair)?: A Little Help needed standing up from a chair using your arms (e.g., wheelchair or bedside chair)?: None Help needed to walk in hospital room?: A Little Help needed climbing 3-5 steps with a railing? : A Little 6 Click Score: 21    End of Session Equipment Utilized During Treatment: Gait belt;Oxygen Activity Tolerance: Patient tolerated treatment well Patient left: with call bell/phone within reach;in bed;with bed alarm set Nurse Communication: Mobility status PT Visit Diagnosis: Difficulty in walking, not elsewhere classified (R26.2);Muscle weakness (generalized) (M62.81)    Time: 4174-0814 PT Time Calculation (min) (ACUTE ONLY): 37 min   Charges:   PT Evaluation $PT Eval Moderate Complexity: 1 Mod PT Treatments $Gait Training: 8-22 mins          Wyona Almas, PT, DPT Acute Rehabilitation Services Pager [phone] Office [phone]   Deno Etienne 02/18/2020, 4:22 PM

## 2020-02-19 LAB — BASIC METABOLIC PANEL
Anion gap: 12 (ref 5–15)
BUN: 69 mg/dL — ABNORMAL HIGH (ref 8–23)
CO2: 22 mmol/L (ref 22–32)
Calcium: 8.9 mg/dL (ref 8.9–10.3)
Chloride: 100 mmol/L (ref 98–111)
Creatinine, Ser: 7.7 mg/dL — ABNORMAL HIGH (ref 0.44–1.00)
GFR calc Af Amer: 5 mL/min — ABNORMAL LOW (ref >60)
GFR calc non Af Amer: 5 mL/min — ABNORMAL LOW (ref >60)
Glucose, Bld: 117 mg/dL — ABNORMAL HIGH (ref 70–99)
Potassium: 4.5 mmol/L (ref 3.5–5.1)
Sodium: 134 mmol/L — ABNORMAL LOW (ref 135–145)

## 2020-02-19 LAB — CBC
HCT: 25.4 % — ABNORMAL LOW (ref 36.0–46.0)
Hemoglobin: 7.7 g/dL — ABNORMAL LOW (ref 12.0–15.0)
MCH: 29.7 pg (ref 26.0–34.0)
MCHC: 30.3 g/dL (ref 30.0–36.0)
MCV: 98.1 fL (ref 80.0–100.0)
Platelets: 168 10*3/uL (ref 150–400)
RBC: 2.59 MIL/uL — ABNORMAL LOW (ref 3.87–5.11)
RDW: 19.6 % — ABNORMAL HIGH (ref 11.5–15.5)
WBC: 9.4 10*3/uL (ref 4.0–10.5)
nRBC: 0 % (ref 0.0–0.2)

## 2020-02-19 LAB — GLUCOSE, CAPILLARY: Glucose-Capillary: 89 mg/dL (ref 70–99)

## 2020-02-19 MED ORDER — DOXERCALCIFEROL 4 MCG/2ML IV SOLN
3.0000 ug | INTRAVENOUS | Status: DC
  Filled 2020-02-19: qty 2

## 2020-02-19 MED ORDER — PROMETHAZINE HCL 25 MG/ML IJ SOLN
6.2500 mg | Freq: Four times a day (QID) | INTRAMUSCULAR | Status: DC | PRN
  Administered 2020-02-21: 6.25 mg via INTRAVENOUS

## 2020-02-19 MED ORDER — CHLORHEXIDINE GLUCONATE CLOTH 2 % EX PADS
6.0000 | MEDICATED_PAD | Freq: Every day | CUTANEOUS | Status: DC
  Administered 2020-02-20 – 2020-02-21 (×3): 6 via TOPICAL

## 2020-02-19 NOTE — Progress Notes (Signed)
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 fib presents to emergency department with worsening headache since 1 week.  Palliative care was asked to get involved to aid on [place] conversations.  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 Patient herself has some apprehensions about bronchoscopy for tissue diagnosis.   Today's Discussion (02/19/2020): Chart reviewed. Plan for bronchoscopy with tissue biopsy on 10/5. Plan for MRI brain w. And w/o contrast tomorrow followed by HD.   I met with Natasha [name] and Natasha [name] at bedside. We discussed the current situation she shares that she will be able to go home on Wednesday after three days of HD post MRI. We discussed that she would then come back the following Tuesday for bronchoscopy. Natasha [name] shared concerns over insurance coverage of OP procedures which we talked through.  Discussed the importance of continued conversation with family and their  medical providers regarding overall plan of care and treatment options, ensuring decisions are within the context of the patients values and GOCs.  Questions and concerns addressed   Subjective Assessment: Patient is sitting up in bed states that she had a headache overnight though does not feel any pain now nor does she feel nauseated which she also endorsed having overnight.   Vital Signs Vitals:   02/19/20 0808 02/19/20 0900  BP:  (!) 149/70  Pulse:  (!) 59  Resp:  20  Temp:    SpO2: 100% 100%    Intake/Output Summary (Last 24  hours) at 02/19/2020 1018 Last data filed at 02/19/2020 0340 Gross per 24 hour  Intake 250 ml  Output 2 ml  Net 248 ml   Last Weight  Most recent update: 02/19/2020  3:57 AM   Weight  57.4 kg (126 lb 8.7 oz)           Gen:  NAD HEENT: moist mucous membranes CV: Regular rate and rhythm, no murmurs rubs or gallops PULM: clear to auscultation bilaterally. No wheezes/rales/rhonchi  ABD: soft/nontender/nondistended/normal bowel sounds  EXT: No edema  Neuro: Alert and oriented x4  SUMMARY OF RECOMMENDATIONS DNAR/DNI  MOST Completed, paper copy placed onto the chart electric copy can be found in Vynca  DNR Form Completed, paper copy placed onto the chart electric copy can be found in Vynca  Patient would like to pursue bronchoscopy with biopsy - she is interested in knowing what treatments are able to be offered thereafter  MRI brain tomorrow w/ 3 sequential days of HD   OP Palliative Care. Patient shares that she is not yet ready for hospice  CM - Help arrange for patient to get rollator - Patient will need PT evaluation first on schedule today  Spiritual support  Symptoms:  HA - Oxycodone works effectively would continue PRN and prescribe upon discharge  Nausea: Continue zofran  Generalized Weakness: PT/OT Eval - will likely need and benefit from [facility]  Time Spent: 25 Greater than 50% of the time was spent in counseling and coordination of care ______________________________________________________________________________________ [place] Team Team Cell Phone: [phone] Please utilize secure chat with additional questions, if there is no  response within 30 minutes please call the above phone number  Palliative Medicine Team providers are available by phone from 7am to 7pm daily and can be reached through the team cell phone.  Should this patient require assistance outside of these hours, please call the patient's  attending physician.

## 2020-02-19 NOTE — Progress Notes (Signed)
[facility] Progress Note   Dialysis Orders: [facility] MWF 4 hrs   300/800   49.5kg   3.0 K/ 2.25 Ca  UFP 3   Hep none L AVG -Hectorol 3 mcg IV TIW -No ESAD/T Lung mass/possible malignancy.  post HD wts running 51.5 on average Assessment/Plan: 1. Acute on Chronic Anemia-  FOBT +. HGB 6' > 8.1 > 7.7  after transfusion. No ESA 2/2 malignancy no heparin HD 2. 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 Needs MRI for xrt staging I believe, if needs gadolinium would keep her in hospital. She would need HD the day of the procedure and also again the next day to remove the contrast. She is scheduled for video bronch 10/5 3. Chronic hypoxic resp failure - seen by Pulm, 4. ESRD - MWF  HD Monday (after MRI, see above).  5. Hypertension/volume - continue Midodrine 10 mg PO [email or web address] UF 609 9/24. - weights incongruent with exam - way above EDW - needs standing weights - leaving consistently about 2 kg above EDW - may need to be raised at d/c continue to trend 6. Metabolic bone disease  Last P elevated - repeat , .if P still up need to ^ binders d/c calcitriol - she is on hectorol 3 with HD 7. Severe COPD / emphysema 8. Nutrition -Renal diet with fld restrictions. Albumin low, add protein supps, renal vits.  9. COPD- per primary 10. Chron's disease- per primary 11. DNR - palliative team is going to be involved  Myriam Jacobson, PA-C [place] [phone] 02/19/2020,11:01 AM  LOS: 4 days   Subjective:   Note episode of abdominal pain with nausea at supper the last two evenings. Given IV med Rx and slept all night.  Ate breakfast without problems.  Objective Vitals:   02/19/20 0357 02/19/20 0359 02/19/20 0808 02/19/20 0900  BP:  (!) 128/58  (!) 149/70  Pulse:  (!) 59  (!) 59  Resp:  18  20  Temp:  98.2 F (36.8 C)    TempSrc:  Oral    SpO2:  100% 100% 100%  Weight: 57.4 kg     Height:       Physical Exam General: alert and articulate  slender elderly female NAD Heart: RRR Lungs: grossly clear Abdomen: soft Extremities: tr LE edema Dialysis Access:  Left AVGG + bruit   Additional Objective Labs: Basic Metabolic Panel: Recent Labs  Lab 02/15/20 1410 02/16/20 0500 02/17/20 0308 02/18/20 0407 02/19/20 0500  NA  --    < > 136 137 134*  K  --    < > 3.4* 4.1 4.5  CL  --    < > 98 102 100  CO2  --    < > 24 24 22   GLUCOSE  --    < > 113* 114* 117*  BUN  --    < > 22 29* 69*  CREATININE  --    < > 5.39* 5.26* 7.70*  CALCIUM  --    < > 8.6* 8.8* 8.9  PHOS 8.2*  --   --   --   --    < > = values in this interval not displayed.   Liver Function Tests: Recent Labs  Lab 02/15/20 1110 02/16/20 0500  AST 18 20  ALT 14 16  ALKPHOS 41 44  BILITOT 0.8 1.2  PROT 6.4* 6.0*  ALBUMIN 3.2* 2.9*   No results for input(s): LIPASE, AMYLASE in the  last 168 hours. CBC: Recent Labs  Lab 02/15/20 1110 02/15/20 1110 02/16/20 0953 02/16/20 0953 02/17/20 0308 02/18/20 0407 02/19/20 0500  WBC 6.3   < > 5.3   < > 3.5* 8.6 9.4  HGB 7.1*   < > 6.9*   < > 8.4* 8.1* 7.7*  HCT 24.2*   < > 22.9*   < > 26.6* 26.6* 25.4*  MCV 103.9*  --  101.3*  --  94.0 96.7 98.1  PLT 145*   < > 138*   < > 143* 152 168   < > = values in this interval not displayed.   Blood Culture    Component Value Date/Time   SDES BLOOD RIGHT ARM 04/11/2019 1340   SPECREQUEST  04/11/2019 1340    BOTTLES DRAWN AEROBIC AND ANAEROBIC Blood Culture results may not be optimal due to an inadequate volume of blood received in culture bottles   CULT  04/11/2019 1340    NO GROWTH 5 DAYS Performed at [facility], [place] [address], [place], [place] [address]    REPTSTATUS 04/16/2019 FINAL 04/11/2019 1340    Cardiac Enzymes: No results for input(s): CKTOTAL, CKMB, CKMBINDEX, TROPONINI in the last 168 hours. CBG: Recent Labs  Lab 02/17/20 0454 02/17/20 1115 02/17/20 1742 02/17/20 2026 02/19/20 0922  GLUCAP 111* 71 121* 117* 89   Iron Studies:  No results for input(s): IRON, TIBC, TRANSFERRIN, FERRITIN in the last 72 hours. Lab Results  Component Value Date   INR 1.1 02/15/2020   INR 0.9 11/29/2019   INR 1.1 10/13/2018   Studies/Results: CT Super D Chest Wo Contrast  Result Date: 02/17/2020 CLINICAL DATA:  Left upper lobe lung mass preop for EBUS. EXAM: CT CHEST WITHOUT CONTRAST TECHNIQUE: Multidetector CT imaging of the chest was performed using thin slice collimation for electromagnetic bronchoscopy planning purposes, without intravenous contrast. COMPARISON:  Chest CT 11/18/2019 FINDINGS: Cardiovascular: The heart is normal in size. No pericardial effusion. There is mild tortuosity and moderate calcification of the thoracic aorta which is stable. Enlarged pulmonary artery suggesting pulmonary hypertension. Mediastinum/Nodes: Small scattered mediastinal and hilar lymph nodes appears stable. No obvious adenopathy on this noncontrast examination. The esophagus is grossly normal. Lungs/Pleura: Stable severe emphysematous changes and hit stable areas of pulmonary scarring. The left upper lobe lung mass measures 3.3 x 3.0 cm and previously measured 2.2 x 2.0 cm. More superiorly in the left upper lobe there is a stable 6 mm nodule. Subpleural nodular lesion in the left upper lobe on image 73/4 measures 6 mm and appears stable. Upper Abdomen: No significant upper abdominal findings. Musculoskeletal: No significant bony findings. IMPRESSION: 1. Interval enlargement of the left upper lobe lung mass, now measuring 3.3 x 3.0 cm. 2. Stable 6 mm left upper lobe pulmonary nodules. 3. Stable severe emphysematous changes and pulmonary scarring. 4. Enlarged pulmonary artery suggesting pulmonary hypertension. 5. Stable atherosclerotic calcifications involving the thoracic aorta and branch vessels. 6. Emphysema and aortic atherosclerosis. Aortic Atherosclerosis (ICD10-I70.0) and Emphysema (ICD10-J43.9). Electronically Signed   By: Marijo Sanes M.D.   On:  02/17/2020 18:44   Medications:  . sodium chloride   Intravenous Once  . calcitRIOL  0.25 mcg Oral Q M,W,F-HD  . calcium acetate  667 mg Oral TID WC  . Chlorhexidine Gluconate Cloth  6 each Topical Q0600  . dexamethasone (DECADRON) injection  4 mg Intravenous Q8H  . diltiazem  30 mg Oral Q8H  . fluticasone furoate-vilanterol  1 puff Inhalation Daily   And  .  umeclidinium bromide  1 puff Inhalation Daily  . levothyroxine  125 mcg Oral Q0600  . metoprolol tartrate  25 mg Oral BID  . midodrine  10 mg Oral TID WC  . pantoprazole  40 mg Oral BID

## 2020-02-19 NOTE — Progress Notes (Addendum)
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 fib who presents to the emergency department with worsening headache since 1 week. Evaluation in the ED raise concern for brain mets.  Medical oncology, pulmonology has been consulted.  MRI showed; interval development of multifocal area of T2 flair hyperintensity involving both cerebral and cerebellar hemisphere, consistent with vasogenic edema related to new metastatic disease.   Assessment & Plan:   Principal Problem:   Headache Active Problems:   ESRD on dialysis (HCC)   Hypothyroidism   Thrombocytopenia (HCC)   COPD (chronic obstructive pulmonary disease) (HCC)   Chronic respiratory failure with hypoxia (HCC)   Metastatic lung carcinoma, left (HCC)   Acute blood loss anemia   GERD (gastroesophageal reflux disease)   Metastasis to brain [facility])   Brain lesion   Palliative care by specialist   DNR (do not resuscitate)  1-Brain metastasis/headache: -Likely primary is lung.  Patient is known to have a left lung mass. -Oncology has been consulted.  Radiation oncology and pulmonology has been also consulted. -Patient was a started on dexamethasone. -Plan to proceed with bronchoscopy out patient, tentatively schedule for 10/05. -Discussed with PA, Ashlyn Burning, patient need MRI brain with contrast 3 T, and after HD after contrast. Discussed with nephrology plan to keep patient over weekend to do MRI on Monday and HD after. I contacted MRI -radiology department and they made note to do MRI on Monday morning   2-Lung  mass/concern for primary lung cancer now with possible mets to the brain; Patient noted to have area of concern in the left lung. Bronchoscopy was discussed in the past with patient but she declined. Plan for bronchoscopy out patient.   3-Acute on chronic anemia/chronic GI blood loss No active bleeding noted.  Gastroenterology was consulted.  No plans for any studies at this time Eliquis has been placed on hold. Patient received blood transfusion with hemodialysis Hemoglobin decreased to 6.9.  Posttransfusion increased to 8.4 Hb mild drop to 7.7. repeat tomorrow and transfuse with [facility] as needed.   4-ESRD on hemodialysis on MWF  Nephrology is following Chronic hypotension: Continue with midodrine.  5-Chronic respiratory failure with hypoxia secondary to COPD: She uses 3 to 4 L of oxygen at home. Stable from respiratory standpoint  6-History of Crohn's disease: She is status post surgery previously. Recent endoscopy and colonoscopy. Currently on dexamethasone  7-Paroxysmal A. Fib: Continue with With Cardizem.  Holding Eliquis due to concern for bleed and possible need for procedure  8-Hypothyroidism: Continue with Synthroid  9-History of PE DVT: Appears to be remote holding anticoagulation as discussed Nausea; continue with Zofran and phenergan PRN./  On PPI.    Estimated body mass index is 21.72 kg/m as calculated from the following:   Height as of this encounter: 5' 4"  (1.626 m).   Weight as of this encounter: 57.4 kg.   DVT prophylaxis: SCDs Code Status: DNR Family Communication: Care discussed with patient Disposition Plan:  Status is: Inpatient  Remains inpatient appropriate because:Ongoing diagnostic testing needed not appropriate for outpatient work up   Dispo: The patient is from: Home  Anticipated d/c is to: To be determined              Anticipated d/c date is: 3 days              Patient currently is not medically stable to  d/c.        Consultants:   Oncology  Pulmonology    Procedures:   Dialysis  Antimicrobials:  None  Subjective: She is feeling better this am than last night. She was able to sleep all night. She develops nausea, and abdominal pain after supper. She think is from the food, she ate brocoli.   Objective: Vitals:   02/19/20 0007 02/19/20 0357 02/19/20 0359 02/19/20 0808  BP: (!) 136/58  (!) 128/58   Pulse: 62  (!) 59   Resp: 16  18   Temp: 97.7 F (36.5 C)  98.2 F (36.8 C)   TempSrc: Oral  Oral   SpO2:   100% 100%  Weight:  57.4 kg    Height:        Intake/Output Summary (Last 24 hours) at 02/19/2020 0905 Last data filed at 02/19/2020 0340 Gross per 24 hour  Intake 250 ml  Output 2 ml  Net 248 ml   Filed Weights   02/17/20 0454 02/18/20 0445 02/19/20 0357  Weight: 53.5 kg 55.7 kg 57.4 kg    Examination:  General exam: NAD Respiratory system: CTA Cardiovascular system: S 1, S 2 RRR Gastrointestinal system: BS present, soft, nt Central nervous system: Alert Extremities:Symmeric power  Data Reviewed: I have personally reviewed following labs and imaging studies  CBC: Recent Labs  Lab 02/15/20 1110 02/16/20 0953 02/17/20 0308 02/18/20 0407 02/19/20 0500  WBC 6.3 5.3 3.5* 8.6 9.4  HGB 7.1* 6.9* 8.4* 8.1* 7.7*  HCT 24.2* 22.9* 26.6* 26.6* 25.4*  MCV 103.9* 101.3* 94.0 96.7 98.1  PLT 145* 138* 143* 152 979   Basic Metabolic Panel: Recent Labs  Lab 02/15/20 1110 02/15/20 1410 02/16/20 0500 02/17/20 0308 02/18/20 0407 02/19/20 0500  NA 136  --  138 136 137 134*  K 3.6  --  4.1 3.4* 4.1 4.5  CL 103  --  107 98 102 100  CO2 18*  --  14* 24 24 22   GLUCOSE 75  --  43* 113* 114* 117*  BUN 40*  --  46* 22 29* 69*  CREATININE 9.30*  --  10.76* 5.39* 5.26* 7.70*  CALCIUM 8.6*  --  8.5* 8.6* 8.8* 8.9  MG  --  1.6*  --   --   --   --   PHOS  --  8.2*  --   --   --   --    GFR: Estimated Creatinine Clearance: 5.6 mL/min (A) (by C-G formula  based on SCr of 7.7 mg/dL (H)). Liver Function Tests: Recent Labs  Lab 02/15/20 1110 02/16/20 0500  AST 18 20  ALT 14 16  ALKPHOS 41 44  BILITOT 0.8 1.2  PROT 6.4* 6.0*  ALBUMIN 3.2* 2.9*   No results for input(s): LIPASE, AMYLASE in the last 168 hours. No results for input(s): AMMONIA in the last 168 hours. Coagulation Profile: Recent Labs  Lab 02/15/20 1410  INR 1.1   Cardiac Enzymes: No results for input(s): CKTOTAL, CKMB, CKMBINDEX, TROPONINI in the last 168 hours. BNP (last 3 results) No results for input(s): PROBNP in the last 8760 hours. HbA1C: No results for input(s): HGBA1C in the last 72 hours. CBG: Recent Labs  Lab 02/16/20 2300  02/17/20 0454 02/17/20 1115 02/17/20 1742 02/17/20 2026  GLUCAP 162* 111* 71 121* 117*   Lipid Profile: No results for input(s): CHOL, HDL, LDLCALC, TRIG, CHOLHDL, LDLDIRECT in the last 72 hours. Thyroid Function Tests: No results for input(s): TSH, T4TOTAL, FREET4, T3FREE, THYROIDAB in the last 72 hours. Anemia Panel: No results for input(s): VITAMINB12, FOLATE, FERRITIN, TIBC, IRON, RETICCTPCT in the last 72 hours. Sepsis Labs: No results for input(s): PROCALCITON, LATICACIDVEN in the last 168 hours.  Recent Results (from the past 240 hour(s))  SARS Coronavirus 2 by RT PCR (hospital order, performed in [facility] hospital lab) Nasopharyngeal Nasopharyngeal Swab     Status: None   Collection Time: 02/15/20  2:10 PM   Specimen: Nasopharyngeal Swab  Result Value Ref Range Status   SARS Coronavirus 2 NEGATIVE NEGATIVE Final    Comment: (NOTE) SARS-CoV-2 target nucleic acids are NOT DETECTED.  The SARS-CoV-2 RNA is generally detectable in upper and lower respiratory specimens during the acute phase of infection. The lowest concentration of SARS-CoV-2 viral copies this assay can detect is 250 copies / mL. A negative result does not preclude SARS-CoV-2 infection and should not be used as the sole basis for treatment or  other patient management decisions.  A negative result may occur with improper specimen collection / handling, submission of specimen other than nasopharyngeal swab, presence of viral mutation(s) within the areas targeted by this assay, and inadequate number of viral copies (<250 copies / mL). A negative result must be combined with clinical observations, patient history, and epidemiological information.  Fact Sheet for Patients:   StrictlyIdeas.no  Fact Sheet for Healthcare Providers: BankingDealers.co.za  This test is not yet approved or  cleared by the [place] FDA and has been authorized for detection and/or diagnosis of SARS-CoV-2 by FDA under an Emergency Use Authorization (EUA).  This EUA will remain in effect (meaning this test can be used) for the duration of the COVID-19 declaration under Section 564(b)(1) of the Act, 21 U.S.C. section 360bbb-3(b)(1), unless the authorization is terminated or revoked sooner.  Performed at [facility], [place] [address], [place], [place] [address]          Radiology Studies: CT Super D Chest Wo Contrast  Result Date: 02/17/2020 CLINICAL DATA:  Left upper lobe lung mass preop for EBUS. EXAM: CT CHEST WITHOUT CONTRAST TECHNIQUE: Multidetector CT imaging of the chest was performed using thin slice collimation for electromagnetic bronchoscopy planning purposes, without intravenous contrast. COMPARISON:  Chest CT 11/18/2019 FINDINGS: Cardiovascular: The heart is normal in size. No pericardial effusion. There is mild tortuosity and moderate calcification of the thoracic aorta which is stable. Enlarged pulmonary artery suggesting pulmonary hypertension. Mediastinum/Nodes: Small scattered mediastinal and hilar lymph nodes appears stable. No obvious adenopathy on this noncontrast examination. The esophagus is grossly normal. Lungs/Pleura: Stable severe emphysematous changes and hit stable areas  of pulmonary scarring. The left upper lobe lung mass measures 3.3 x 3.0 cm and previously measured 2.2 x 2.0 cm. More superiorly in the left upper lobe there is a stable 6 mm nodule. Subpleural nodular lesion in the left upper lobe on image 73/4 measures 6 mm and appears stable. Upper Abdomen: No significant upper abdominal findings. Musculoskeletal: No significant bony findings. IMPRESSION: 1. Interval enlargement of the left upper lobe lung mass, now measuring 3.3 x 3.0 cm. 2. Stable 6 mm left upper lobe pulmonary nodules. 3. Stable severe emphysematous changes and pulmonary scarring. 4. Enlarged pulmonary artery suggesting pulmonary hypertension. 5. Stable atherosclerotic  calcifications involving the thoracic aorta and branch vessels. 6. Emphysema and aortic atherosclerosis. Aortic Atherosclerosis (ICD10-I70.0) and Emphysema (ICD10-J43.9). Electronically Signed   By: Marijo Sanes M.D.   On: 02/17/2020 18:44        Scheduled Meds: . sodium chloride   Intravenous Once  . calcitRIOL  0.25 mcg Oral Q M,W,F-HD  . calcium acetate  667 mg Oral TID WC  . Chlorhexidine Gluconate Cloth  6 each Topical Q0600  . dexamethasone (DECADRON) injection  4 mg Intravenous Q8H  . diltiazem  30 mg Oral Q8H  . fluticasone furoate-vilanterol  1 puff Inhalation Daily   And  . umeclidinium bromide  1 puff Inhalation Daily  . levothyroxine  125 mcg Oral Q0600  . metoprolol tartrate  25 mg Oral BID  . midodrine  10 mg Oral TID WC  . pantoprazole  40 mg Oral BID   Continuous Infusions:   LOS: 4 days    Time spent: 35 minutes.     Elmarie Shiley, MD [facility]   If 7PM-7AM, please contact night-coverage [email or web address]  02/19/2020, 9:05 AM

## 2020-02-20 ENCOUNTER — Inpatient Hospital Stay (HOSPITAL_COMMUNITY): Payer: Medicare Other

## 2020-02-20 ENCOUNTER — Encounter (HOSPITAL_COMMUNITY): Payer: Self-pay | Admitting: Internal Medicine

## 2020-02-20 LAB — CBC
HCT: 26.2 % — ABNORMAL LOW (ref 36.0–46.0)
Hemoglobin: 8.1 g/dL — ABNORMAL LOW (ref 12.0–15.0)
MCH: 30.2 pg (ref 26.0–34.0)
MCHC: 30.9 g/dL (ref 30.0–36.0)
MCV: 97.8 fL (ref 80.0–100.0)
Platelets: 190 10*3/uL (ref 150–400)
RBC: 2.68 MIL/uL — ABNORMAL LOW (ref 3.87–5.11)
RDW: 19.1 % — ABNORMAL HIGH (ref 11.5–15.5)
WBC: 9.3 10*3/uL (ref 4.0–10.5)
nRBC: 0.2 % (ref 0.0–0.2)

## 2020-02-20 LAB — BASIC METABOLIC PANEL
Anion gap: 16 — ABNORMAL HIGH (ref 5–15)
BUN: 104 mg/dL — ABNORMAL HIGH (ref 8–23)
CO2: 18 mmol/L — ABNORMAL LOW (ref 22–32)
Calcium: 8.6 mg/dL — ABNORMAL LOW (ref 8.9–10.3)
Chloride: 98 mmol/L (ref 98–111)
Creatinine, Ser: 10.58 mg/dL — ABNORMAL HIGH (ref 0.44–1.00)
GFR calc Af Amer: 4 mL/min — ABNORMAL LOW (ref >60)
GFR calc non Af Amer: 3 mL/min — ABNORMAL LOW (ref >60)
Glucose, Bld: 138 mg/dL — ABNORMAL HIGH (ref 70–99)
Potassium: 4.8 mmol/L (ref 3.5–5.1)
Sodium: 132 mmol/L — ABNORMAL LOW (ref 135–145)

## 2020-02-20 MED ORDER — DOXERCALCIFEROL 4 MCG/2ML IV SOLN
INTRAVENOUS | Status: AC
  Administered 2020-02-20: 3 ug via INTRAVENOUS
  Filled 2020-02-20: qty 2

## 2020-02-20 MED ORDER — LORAZEPAM 2 MG/ML IJ SOLN
0.5000 mg | Freq: Once | INTRAMUSCULAR | Status: AC
  Administered 2020-02-20: 0.5 mg via INTRAVENOUS
  Filled 2020-02-20: qty 1

## 2020-02-20 MED ORDER — CHLORHEXIDINE GLUCONATE CLOTH 2 % EX PADS: 6.0000 | MEDICATED_PAD | Freq: Every day | CUTANEOUS | Status: DC

## 2020-02-20 MED ORDER — GADOBUTROL 1 MMOL/ML IV SOLN
5.0000 mL | Freq: Once | INTRAVENOUS | Status: AC | PRN
  Administered 2020-02-20: 5 mL via INTRAVENOUS

## 2020-02-20 NOTE — Progress Notes (Signed)
The patient will be set up for a bronchoscopy on October 5.  She should stop her eliquis on October 2.  I attempted to relay this to patient's family but no answer on their phone.  I will put it on their discharge instructions.

## 2020-02-20 NOTE — Discharge Instructions (Signed)
-   Stop Eliquis on October 2 - Bronchoscopy on Tuesday, October 5 with Dr. Carney Corners - Pulmonary office will call you regarding COVID testing - Please call the pulmonary office if any questions at [phone]

## 2020-02-20 NOTE — Telephone Encounter (Signed)
Pt is still inpatient.  ENB already scheduled for 10/5 at 10:00.  I have scheduled covid test for 10/2 at 9:55.  I spoke to [place] at [facility] CT & she will burn disk and have sent to Endo.  I have not contacted pt yet due to looks like has MRI scheduled for this a.m.

## 2020-02-20 NOTE — Progress Notes (Signed)
   02/20/20 1102  Clinical Encounter Type  Visited With Patient  Visit Type Initial;Spiritual support  Referral From Palliative care team  Consult/Referral To Chaplain  Spiritual Encounters  Spiritual Needs Other (Comment) (Story telling)  This chaplain responded to PMT consult for spiritual care.  The chaplain introduced herself and remained bedside as the Pt. was self treating her dialysis site.  The chaplain's presence led to the Pt. story telling about her mother's push to be part of the community and establish a family [employer] faith tradition. The chaplain affirmed the Pt. smile as the Pt. told her family story beginning 50+ years ago. PT arrived for Pt. care.  The chaplain will return for a F/U visit today.

## 2020-02-20 NOTE — Progress Notes (Signed)
Physical Therapy Treatment Patient Details Name: Natasha [name] MRN: [id number] DOB: Jan 05, 1946 Today's Date: 02/20/2020    History of Present Illness 74 y.o. 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 fib presents to emergency department with worsening headache since 1 week.    PT Comments    Pt progressing towards physical therapy goals. Was able to perform transfers and ambulation with gross min guard assist however requiring cues for safety, managing brakes on rollator, and hand placement on seated surface instead of pulling up from rollator. Rollator height was adjusted for optimal fit. Will continue to follow and progress as able per POC.    Follow Up Recommendations  Home health PT     Equipment Recommendations  3in1 (PT);Other (comment) (Rollator)    Recommendations for Other Services       Precautions / Restrictions Precautions Precautions: Fall Restrictions Weight Bearing Restrictions: No    Mobility  Bed Mobility Overal bed mobility: Modified Independent             General bed mobility comments: HOB elevated, use of bed rail, and increased time.   Transfers Overall transfer level: Needs assistance Equipment used: 4-wheeled walker Transfers: Sit to/from Stand Sit to Stand: Supervision Stand pivot transfers: Supervision       General transfer comment: Cues for locking Rollator prior to transition. VC's for hand placement on seated surface for safety.  Ambulation/Gait Ambulation/Gait assistance: Min guard Gait Distance (Feet): 125 Feet Assistive device: 4-wheeled walker Gait Pattern/deviations: Step-through pattern;Decreased  stride length Gait velocity: decreased Gait velocity interpretation: <1.31 ft/sec, indicative of household ambulator General Gait Details: Rollator adjusted for proper height. VC's throughout gait training for improved posture, closer walker proximity, and forward gaze. Pt with 1 instance of running into object on the L side in hall but recovered without assist.    Stairs             Wheelchair Mobility    Modified Rankin (Stroke Patients Only)       Balance Overall balance assessment: Needs assistance Sitting-balance support: Feet supported Sitting balance-Leahy Scale: Good     Standing balance support: Bilateral upper extremity supported Standing balance-Leahy Scale: Poor                              Cognition Arousal/Alertness: Awake/alert Behavior During Therapy: WFL for tasks assessed/performed Overall Cognitive Status: Within Functional Limits for tasks assessed                                        Exercises      General Comments General comments (skin integrity, edema, etc.): on 4L/min supplemental O2 throughout session.       Pertinent Vitals/Pain Pain Assessment: Faces Faces Pain Scale: Hurts little more Pain Location: posterior cervical into shoulders Pain Descriptors / Indicators: Discomfort;Aching Pain Intervention(s): Limited activity within patient's tolerance;Monitored during session;Repositioned    Home Living Family/patient expects to be discharged to:: Private residence Living Arrangements: Spouse/significant other Available Help at Discharge: Family;Available 24 hours/day Type of Home: House Home Access: Level entry   Home Layout: One level Home Equipment: Gilford Rile -  2 wheels;Cane - single point      Prior Function Level of Independence: Needs assistance  Gait / Transfers Assistance Needed: does not use AD ADL's / Homemaking Assistance Needed: Independent ADL's, assist for IADL's Comments: Pt on 3-4L home  O2 at baseline, stands to shower (does not wear her 02 in shower), works with husband to complete IADL.   PT Goals (current goals can now be found in the care plan section) Acute Rehab PT Goals Patient Stated Goal: go home PT Goal Formulation: With patient Time For Goal Achievement: 03/03/20 Potential to Achieve Goals: Good Progress towards PT goals: Progressing toward goals    Frequency    Min 3X/week      PT Plan Current plan remains appropriate    Co-evaluation              AM-PAC PT "6 Clicks" Mobility   Outcome Measure  Help needed turning from your back to your side while in a flat bed without using bedrails?: None Help needed moving from lying on your back to sitting on the side of a flat bed without using bedrails?: None Help needed moving to and from a bed to a chair (including a wheelchair)?: A Little Help needed standing up from a chair using your arms (e.g., wheelchair or bedside chair)?: None Help needed to walk in hospital room?: A Little Help needed climbing 3-5 steps with a railing? : A Little 6 Click Score: 21    End of Session Equipment Utilized During Treatment: Gait belt;Oxygen Activity Tolerance: Patient tolerated treatment well Patient left: with call bell/phone within reach;in bed;with bed alarm set Nurse Communication: Mobility status PT Visit Diagnosis: Difficulty in walking, not elsewhere classified (R26.2);Muscle weakness (generalized) (M62.81)     Time: [id number] PT Time Calculation (min) (ACUTE ONLY): 40 min  Charges:  $Gait Training: 38-52 mins                     Rolinda Roan, PT, DPT Acute Rehabilitation Services Pager: [phone] Office: [phone]    Thelma Comp 02/20/2020, 2:48 PM

## 2020-02-20 NOTE — Progress Notes (Signed)
PROGRESS NOTE    Natasha [name]  ZPH:[id number] DOB: [birth year] DOA: 02/15/2020 PCP: [name], MD   Brief Narrative: 74 year old with past medical history significant for ESRD on hemodialysis MWF, Crohn's disease on Humira and prednisone, anemia of chronic disease, thrombocytopenia, lung mass, chronic hypoxic respiratory failure secondary to underlying COPD on 3 L of oxygen at home, PE DVT on Eliquis, hypertension, hyperlipidemia, paroxysmal A. fib who presents to the emergency department with worsening headache since 1 week. Evaluation in the ED raise concern for brain mets.  Medical oncology, pulmonology has been consulted.  MRI showed; interval development of multifocal area of T2 flair hyperintensity involving both cerebral and cerebellar hemisphere, consistent with vasogenic edema related to new metastatic disease.   Assessment & Plan:   Principal Problem:   Headache Active Problems:   ESRD on dialysis (HCC)   Hypothyroidism   Thrombocytopenia (HCC)   COPD (chronic obstructive pulmonary disease) (HCC)   Chronic respiratory failure with hypoxia (HCC)   Metastatic lung carcinoma, left (HCC)   Acute blood loss anemia   GERD (gastroesophageal reflux disease)   Metastasis to brain [facility])   Brain lesion   Palliative care by specialist   DNR (do not resuscitate)  1-Brain metastasis/headache: -Likely primary is lung.  Patient is known to have a left lung mass. -Oncology has been consulted.  Radiation oncology and pulmonology has been also consulted. -Patient was a started on dexamethasone. -Plan to proceed with bronchoscopy out patient, tentatively schedule for 10/05. -Discussed with PA, Ashlyn Burning, patient need MRI brain with contrast 3 T, and after HD after contrast.  -Underwent MRI today.   2-Lung mass/concern for primary lung cancer now with possible mets to the brain; Patient noted to have area of concern in the left lung. Bronchoscopy was discussed in  the past with patient but she declined. Plan for bronchoscopy out patient.   3-Acute on chronic anemia/chronic GI blood loss No active bleeding noted.  Gastroenterology was consulted.  No plans for any studies at this time Eliquis has been placed on hold. Patient received blood transfusion with hemodialysis Hemoglobin decreased to 6.9.  Posttransfusion increased to 8.4 Hb stable at 8.   4-ESRD on hemodialysis on MWF  Nephrology is following Chronic hypotension: Continue with midodrine.  5-Chronic respiratory failure with hypoxia secondary to COPD: She uses 3 to 4 L of oxygen at home. Stable from respiratory standpoint  6-History of Crohn's disease: She is status post surgery previously. Recent endoscopy and colonoscopy. Currently on dexamethasone  7-Paroxysmal A. Fib: Continue with With Cardizem.  Holding Eliquis due to concern for bleed and possible need for procedure  8-Hypothyroidism: Continue with Synthroid  9-History of PE DVT: Appears to be remote holding anticoagulation as discussed Nausea; continue with Zofran and phenergan PRN./  On PPI.   Nausea, abdominal pain; indigestion. Improve.   Estimated body mass index is 21.53 kg/m as calculated from the following:   Height as of this encounter: 5' 4"  (1.626 m).   Weight as of this encounter: 56.9 kg.   DVT prophylaxis: SCDs Code Status: DNR Family Communication: Care discussed with patient Disposition Plan:  Status is: Inpatient  Remains inpatient appropriate because:Ongoing diagnostic testing needed not appropriate for outpatient work up   Dispo: The patient is from: Home              Anticipated d/c is to: To be determined              Anticipated d/c date is: 3 days  Patient currently is not medically stable to d/c.        Consultants:   Oncology  Pulmonology    Procedures:   Dialysis  Antimicrobials:  None  Subjective: She is feeling better from nausea, and abdominal  pain  Objective: Vitals:   02/20/20 1340 02/20/20 1345 02/20/20 1400 02/20/20 1430  BP: (!) 124/59 (!) 124/59 140/67 121/67  Pulse: 63 (!) 59 (!) 55 (!) 57  Resp: 16     Temp: 98.4 F (36.9 C)     TempSrc: Oral     SpO2: 100%     Weight: 56.9 kg     Height:        Intake/Output Summary (Last 24 hours) at 02/20/2020 1534 Last data filed at 02/20/2020 0730 Gross per 24 hour  Intake 240 ml  Output --  Net 240 ml   Filed Weights   02/18/20 0445 02/19/20 0357 02/20/20 1340  Weight: 55.7 kg 57.4 kg 56.9 kg    Examination:  General exam: NAD Respiratory system: CTA Cardiovascular system: S 1,  S 2 RRR Gastrointestinal system: BS present, soft, nt Central nervous system: alert Extremities:Symmeric power  Data Reviewed: I have personally reviewed following labs and imaging studies  CBC: Recent Labs  Lab 02/16/20 0953 02/17/20 0308 02/18/20 0407 02/19/20 0500 02/20/20 0316  WBC 5.3 3.5* 8.6 9.4 9.3  HGB 6.9* 8.4* 8.1* 7.7* 8.1*  HCT 22.9* 26.6* 26.6* 25.4* 26.2*  MCV 101.3* 94.0 96.7 98.1 97.8  PLT 138* 143* 152 168 875   Basic Metabolic Panel: Recent Labs  Lab 02/15/20 1110 02/15/20 1410 02/16/20 0500 02/17/20 0308 02/18/20 0407 02/19/20 0500 02/20/20 0316  NA   < >  --  138 136 137 134* 132*  K   < >  --  4.1 3.4* 4.1 4.5 4.8  CL   < >  --  107 98 102 100 98  CO2   < >  --  14* 24 24 22  18*  GLUCOSE   < >  --  43* 113* 114* 117* 138*  BUN   < >  --  46* 22 29* 69* 104*  CREATININE   < >  --  10.76* 5.39* 5.26* 7.70* 10.58*  CALCIUM   < >  --  8.5* 8.6* 8.8* 8.9 8.6*  MG  --  1.6*  --   --   --   --   --   PHOS  --  8.2*  --   --   --   --   --    < > = values in this interval not displayed.   GFR: Estimated Creatinine Clearance: 4.1 mL/min (A) (by C-G formula based on SCr of 10.58 mg/dL (H)). Liver Function Tests: Recent Labs  Lab 02/15/20 1110 02/16/20 0500  AST 18 20  ALT 14 16  ALKPHOS 41 44  BILITOT 0.8 1.2  PROT 6.4* 6.0*  ALBUMIN 3.2*  2.9*   No results for input(s): LIPASE, AMYLASE in the last 168 hours. No results for input(s): AMMONIA in the last 168 hours. Coagulation Profile: Recent Labs  Lab 02/15/20 1410  INR 1.1   Cardiac Enzymes: No results for input(s): CKTOTAL, CKMB, CKMBINDEX, TROPONINI in the last 168 hours. BNP (last 3 results) No results for input(s): PROBNP in the last 8760 hours. HbA1C: No results for input(s): HGBA1C in the last 72 hours. CBG: Recent Labs  Lab 02/17/20 0454 02/17/20 1115 02/17/20 1742 02/17/20 2026 02/19/20 0922  GLUCAP 111* 71 121* 117*  89   Lipid Profile: No results for input(s): CHOL, HDL, LDLCALC, TRIG, CHOLHDL, LDLDIRECT in the last 72 hours. Thyroid Function Tests: No results for input(s): TSH, T4TOTAL, FREET4, T3FREE, THYROIDAB in the last 72 hours. Anemia Panel: No results for input(s): VITAMINB12, FOLATE, FERRITIN, TIBC, IRON, RETICCTPCT in the last 72 hours. Sepsis Labs: No results for input(s): PROCALCITON, LATICACIDVEN in the last 168 hours.  Recent Results (from the past 240 hour(s))  SARS Coronavirus 2 by RT PCR (hospital order, performed in [facility] hospital lab) Nasopharyngeal Nasopharyngeal Swab     Status: None   Collection Time: 02/15/20  2:10 PM   Specimen: Nasopharyngeal Swab  Result Value Ref Range Status   SARS Coronavirus 2 NEGATIVE NEGATIVE Final    Comment: (NOTE) SARS-CoV-2 target nucleic acids are NOT DETECTED.  The SARS-CoV-2 RNA is generally detectable in upper and lower respiratory specimens during the acute phase of infection. The lowest concentration of SARS-CoV-2 viral copies this assay can detect is 250 copies / mL. A negative result does not preclude SARS-CoV-2 infection and should not be used as the sole basis for treatment or other patient management decisions.  A negative result may occur with improper specimen collection / handling, submission of specimen other than nasopharyngeal swab, presence of viral mutation(s)  within the areas targeted by this assay, and inadequate number of viral copies (<250 copies / mL). A negative result must be combined with clinical observations, patient history, and epidemiological information.  Fact Sheet for Patients:   StrictlyIdeas.no  Fact Sheet for Healthcare Providers: BankingDealers.co.za  This test is not yet approved or  cleared by the [place] FDA and has been authorized for detection and/or diagnosis of SARS-CoV-2 by FDA under an Emergency Use Authorization (EUA).  This EUA will remain in effect (meaning this test can be used) for the duration of the COVID-19 declaration under Section 564(b)(1) of the Act, 21 U.S.C. section 360bbb-3(b)(1), unless the authorization is terminated or revoked sooner.  Performed at [facility], [address], [place], [place] [address]          Radiology Studies: MR BRAIN W WO CONTRAST  Result Date: 02/20/2020 CLINICAL DATA:  Metastatic disease. Staging. Patient with known diagnosis of lung cancer. EXAM: MRI HEAD WITHOUT AND WITH CONTRAST TECHNIQUE: Multiplanar, multiecho pulse sequences of the brain and surrounding structures were obtained without and with intravenous contrast. CONTRAST:  74m GADAVIST GADOBUTROL 1 MMOL/ML IV SOLN COMPARISON:  MRI 02/15/2020 FINDINGS: Brain: Multiple peripherally enhancing metastatic lesions on series 9, including: *6 mm lesion in the left frontal lobe (image 21). *6 mm lesion in the high left parietal lobe (image 118). *2 mm lesion in the high left frontal lobe (image 112). *6 mm lesion in the right parieto-occipital region (image 95). *Punctate lesion in the posterior left temporal lobe (image 84). *7 mm lesion in the posterior right temporal lobe (image 76) with additional tiny adjacent lesion (image 72). *Approximately 7 mm lesion in the anterior right temporal lobe (image 51) with limited evaluation given motion. *5 mm lesion in  the right cerebellar hemisphere (image 35) *4 mm lesion in the cerebellar vermis (image 56) with likely additional punctate additional lesion (image 60). These lesions have associated mild surrounding vasogenic edema without substantial mass effect. No midline shift. Basal cisterns are patent. Some of these lesions demonstrate mild susceptibility artifact, compatible with internal hemorrhage. Otherwise, no acute hemorrhage. No acute infarct. Mild cerebral atrophy with ex vacuo ventricular dilation. No hydrocephalus. Vascular: Major arterial  flow voids are maintained at the skull base. Skull and upper cervical spine: Normal marrow signal. Sinuses/Orbits: Negative. Other: No mastoid effusion. IMPRESSION: Numerous infratentorial and supratentorial enhancing metastatic lesions, as detailed above. Mild associated vasogenic edema without substantial mass effect. Electronically Signed   By: Margaretha Sheffield MD   On: 02/20/2020 09:16        Scheduled Meds: . sodium chloride   Intravenous Once  . calcium acetate  667 mg Oral TID WC  . Chlorhexidine Gluconate Cloth  6 each Topical Q0600  . [START ON 02/21/2020] Chlorhexidine Gluconate Cloth  6 each Topical Q0600  . dexamethasone (DECADRON) injection  4 mg Intravenous Q8H  . diltiazem  30 mg Oral Q8H  . doxercalciferol  3 mcg Intravenous Q M,W,F-HD  . fluticasone furoate-vilanterol  1 puff Inhalation Daily   And  . umeclidinium bromide  1 puff Inhalation Daily  . levothyroxine  125 mcg Oral Q0600  . metoprolol tartrate  25 mg Oral BID  . midodrine  10 mg Oral TID WC  . pantoprazole  40 mg Oral BID   Continuous Infusions:   LOS: 5 days    Time spent: 35 minutes.     Elmarie Shiley, MD [facility]   If 7PM-7AM, please contact night-coverage [email or web address]  02/20/2020, 3:34 PM

## 2020-02-20 NOTE — Progress Notes (Signed)
[name] Progress Note   Subjective:   Patient seen and examined at bedside.  Sleeping but easily awoken.  Denies HA, CP, SOB, n/v/d, weakness, dizziness and fatigue.      Objective Vitals:   02/19/20 2038 02/19/20 2357 02/20/20 0359 02/20/20 1107  BP: (!) 132/56 (!) 129/54 (!) 103/56 139/63  Pulse: 66 (!) 59 64 62  Resp: 18 18 18 18   Temp: 98.2 F (36.8 C) 97.7 F (36.5 C) 98.2 F (36.8 C) 97.7 F (36.5 C)  TempSrc: Oral  Oral Oral  SpO2:  100% 100% 100%  Weight:      Height:       Physical Exam General:pleasant, thin, elderly female in NAD Heart:RRR Lungs:CTAB, nml WOB Abdomen:soft, NTND Extremities:no LE edema Dialysis Access: LU AVG +b/t   Filed Weights   02/17/20 0454 02/18/20 0445 02/19/20 0357  Weight: 53.5 kg 55.7 kg 57.4 kg    Intake/Output Summary (Last 24 hours) at 02/20/2020 1352 Last data filed at 02/20/2020 0730 Gross per 24 hour  Intake 240 ml  Output --  Net 240 ml    Additional Objective Labs: Basic Metabolic Panel: Recent Labs  Lab 02/15/20 1410 02/16/20 0500 02/18/20 0407 02/19/20 0500 02/20/20 0316  NA  --    < > 137 134* 132*  K  --    < > 4.1 4.5 4.8  CL  --    < > 102 100 98  CO2  --    < > 24 22 18*  GLUCOSE  --    < > 114* 117* 138*  BUN  --    < > 29* 69* 104*  CREATININE  --    < > 5.26* 7.70* 10.58*  CALCIUM  --    < > 8.8* 8.9 8.6*  PHOS 8.2*  --   --   --   --    < > = values in this interval not displayed.   Liver Function Tests: Recent Labs  Lab 02/15/20 1110 02/16/20 0500  AST 18 20  ALT 14 16  ALKPHOS 41 44  BILITOT 0.8 1.2  PROT 6.4* 6.0*  ALBUMIN 3.2* 2.9*   CBC: Recent Labs  Lab 02/16/20 0953 02/16/20 0953 02/17/20 0308 02/17/20 0308 02/18/20 0407 02/19/20 0500 02/20/20 0316  WBC 5.3   < > 3.5*   < > 8.6 9.4 9.3  HGB 6.9*   < > 8.4*   < > 8.1* 7.7* 8.1*  HCT 22.9*   < > 26.6*   < > 26.6* 25.4* 26.2*  MCV 101.3*  --  94.0  --  96.7 98.1 97.8  PLT 138*   < > 143*   < > 152 168  190   < > = values in this interval not displayed.   CBG: Recent Labs  Lab 02/17/20 0454 02/17/20 1115 02/17/20 1742 02/17/20 2026 02/19/20 0922  GLUCAP 111* 71 121* 117* 89   Studies/Results: MR BRAIN W WO CONTRAST  Result Date: 02/20/2020 CLINICAL DATA:  Metastatic disease. Staging. Patient with known diagnosis of lung cancer. EXAM: MRI HEAD WITHOUT AND WITH CONTRAST TECHNIQUE: Multiplanar, multiecho pulse sequences of the brain and surrounding structures were obtained without and with intravenous contrast. CONTRAST:  74m GADAVIST GADOBUTROL 1 MMOL/ML IV SOLN COMPARISON:  MRI 02/15/2020 FINDINGS: Brain: Multiple peripherally enhancing metastatic lesions on series 9, including: *6 mm lesion in the left frontal lobe (image 21). *6 mm lesion in the high left parietal lobe (image 118). *2 mm lesion in the  high left frontal lobe (image 112). *6 mm lesion in the right parieto-occipital region (image 95). *Punctate lesion in the posterior left temporal lobe (image 84). *7 mm lesion in the posterior right temporal lobe (image 76) with additional tiny adjacent lesion (image 72). *Approximately 7 mm lesion in the anterior right temporal lobe (image 51) with limited evaluation given motion. *5 mm lesion in the right cerebellar hemisphere (image 35) *4 mm lesion in the cerebellar vermis (image 56) with likely additional punctate additional lesion (image 60). These lesions have associated mild surrounding vasogenic edema without substantial mass effect. No midline shift. Basal cisterns are patent. Some of these lesions demonstrate mild susceptibility artifact, compatible with internal hemorrhage. Otherwise, no acute hemorrhage. No acute infarct. Mild cerebral atrophy with ex vacuo ventricular dilation. No hydrocephalus. Vascular: Major arterial flow voids are maintained at the skull base. Skull and upper cervical spine: Normal marrow signal. Sinuses/Orbits: Negative. Other: No mastoid effusion. IMPRESSION:  Numerous infratentorial and supratentorial enhancing metastatic lesions, as detailed above. Mild associated vasogenic edema without substantial mass effect. Electronically Signed   By: Margaretha Sheffield MD   On: 02/20/2020 09:16    Medications:  . sodium chloride   Intravenous Once  . calcium acetate  667 mg Oral TID WC  . Chlorhexidine Gluconate Cloth  6 each Topical Q0600  . dexamethasone (DECADRON) injection  4 mg Intravenous Q8H  . diltiazem  30 mg Oral Q8H  . doxercalciferol  3 mcg Intravenous Q M,W,F-HD  . fluticasone furoate-vilanterol  1 puff Inhalation Daily   And  . umeclidinium bromide  1 puff Inhalation Daily  . levothyroxine  125 mcg Oral Q0600  . metoprolol tartrate  25 mg Oral BID  . midodrine  10 mg Oral TID WC  . pantoprazole  40 mg Oral BID    Dialysis Orders: [facility] MWF 4 hrs 300/800 49.5kg 3.0 K/ 2.25 Ca UFP 3 Hep none L AVG -Hectorol 3 mcg IV TIW -No ESAD/T Lung mass/possible malignancy.  post HD wts running 51.5 on average  Assessment/Plan: 1. Acute on Chronic Anemia- FOBT +. HGB 6' > 8.1 > 7.7>8.1 s/p 1 unit pRBC. No ESA 2/2 malignancy no heparin HD 2. 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 Needs MRI for xrt staging I believe, if needs gadolinium would keep her in hospital. She would need serial HD for at least 3 days if MRI contrast is absolutely necessary. She is scheduled for video bronch 10/5 3. Chronic hypoxic resp failure - seen by Pulm, 4. ESRD - MWF  [id number] (after MRI) and again on Tues and Wed.   5. Hypertension/volume - continue Midodrine 10 mg PO TIW.  Minimal UF removed last HD.  Needs standing weights to better assess EDW, does not appear volume overloaded but per wt 8kg over dry - leaving consistently about 2 kg above EDW - may need to be raised at d/c continue to trend 6. Metabolic bone disease  Last P elevated - repeat pre HD today, if P still up need to ^ binders. Calcitriol dc'd, she is on hectorol 3 with  HD 7. Severe COPD / emphysema 8. Nutrition -Renal diet with fld restrictions. Albumin low, add protein supps, renal vits.  9. COPD- per primary 10. Chron's disease- per primary 11. DNR - palliative team is going to be involved  Jen Mow, PA-C [place] 02/20/2020,1:52 PM  LOS: 5 days

## 2020-02-21 LAB — BASIC METABOLIC PANEL
Anion gap: 14 (ref 5–15)
BUN: 49 mg/dL — ABNORMAL HIGH (ref 8–23)
CO2: 26 mmol/L (ref 22–32)
Calcium: 8.5 mg/dL — ABNORMAL LOW (ref 8.9–10.3)
Chloride: 96 mmol/L — ABNORMAL LOW (ref 98–111)
Creatinine, Ser: 6.16 mg/dL — ABNORMAL HIGH (ref 0.44–1.00)
GFR calc Af Amer: 7 mL/min — ABNORMAL LOW (ref >60)
GFR calc non Af Amer: 6 mL/min — ABNORMAL LOW (ref >60)
Glucose, Bld: 101 mg/dL — ABNORMAL HIGH (ref 70–99)
Potassium: 4.3 mmol/L (ref 3.5–5.1)
Sodium: 136 mmol/L (ref 135–145)

## 2020-02-21 LAB — CBC
HCT: 28 % — ABNORMAL LOW (ref 36.0–46.0)
Hemoglobin: 8.7 g/dL — ABNORMAL LOW (ref 12.0–15.0)
MCH: 30 pg (ref 26.0–34.0)
MCHC: 31.1 g/dL (ref 30.0–36.0)
MCV: 96.6 fL (ref 80.0–100.0)
Platelets: 147 10*3/uL — ABNORMAL LOW (ref 150–400)
RBC: 2.9 MIL/uL — ABNORMAL LOW (ref 3.87–5.11)
RDW: 19.2 % — ABNORMAL HIGH (ref 11.5–15.5)
WBC: 7.1 10*3/uL (ref 4.0–10.5)
nRBC: 0 % (ref 0.0–0.2)

## 2020-02-21 MED ORDER — RENA-VITE PO TABS: 1.0000 | ORAL_TABLET | Freq: Every day | ORAL | Status: DC

## 2020-02-21 MED ORDER — SODIUM CHLORIDE 0.9 % IV SOLN: 100.0000 mL | INTRAVENOUS | Status: DC | PRN

## 2020-02-21 MED ORDER — OXYCODONE HCL 5 MG PO TABS
5.0000 mg | ORAL_TABLET | ORAL | 0 refills | Status: DC | PRN
Start: 2020-02-21 — End: 2020-03-03

## 2020-02-21 MED ORDER — MIDODRINE HCL 5 MG PO TABS
ORAL_TABLET | ORAL | Status: AC
  Filled 2020-02-21: qty 2

## 2020-02-21 MED ORDER — ONDANSETRON HCL 4 MG/2ML IJ SOLN
INTRAMUSCULAR | Status: AC
  Filled 2020-02-21: qty 2

## 2020-02-21 MED ORDER — PROMETHAZINE HCL 25 MG/ML IJ SOLN
INTRAMUSCULAR | Status: AC
  Filled 2020-02-21: qty 1

## 2020-02-21 MED ORDER — OXYCODONE HCL 5 MG PO TABS
ORAL_TABLET | ORAL | Status: AC
  Filled 2020-02-21: qty 1

## 2020-02-21 MED ORDER — SORBITOL 70 % SOLN: 30.0000 mL | Freq: Every day | 0 refills | Status: AC | PRN

## 2020-02-21 MED ORDER — HEPARIN SODIUM (PORCINE) 1000 UNIT/ML DIALYSIS
1000.0000 [IU] | INTRAMUSCULAR | Status: DC | PRN
  Filled 2020-02-21: qty 1

## 2020-02-21 MED ORDER — SORBITOL 70 % SOLN
30.0000 mL | Freq: Every day | Status: DC | PRN
  Filled 2020-02-21: qty 30

## 2020-02-21 MED ORDER — SORBITOL 70 % PO SOLN: 15.0000 mL | Freq: Every day | ORAL | 0 refills | Status: AC | PRN

## 2020-02-21 MED ORDER — ALTEPLASE 2 MG IJ SOLR: 2.0000 mg | Freq: Once | INTRAMUSCULAR | Status: DC | PRN

## 2020-02-21 MED ORDER — LIDOCAINE HCL (PF) 1 % IJ SOLN: 5.0000 mL | INTRAMUSCULAR | Status: DC | PRN

## 2020-02-21 MED ORDER — NEPRO/CARBSTEADY PO LIQD: 237.0000 mL | Freq: Two times a day (BID) | ORAL | Status: DC

## 2020-02-21 MED ORDER — LIDOCAINE-PRILOCAINE 2.5-2.5 % EX CREA
1.0000 "application " | TOPICAL_CREAM | CUTANEOUS | Status: DC | PRN
  Filled 2020-02-21: qty 5

## 2020-02-21 MED ORDER — BISACODYL 5 MG PO TBEC: 5.0000 mg | DELAYED_RELEASE_TABLET | Freq: Once | ORAL | Status: DC

## 2020-02-21 MED ORDER — DEXAMETHASONE 4 MG PO TABS: 4.0000 mg | ORAL_TABLET | Freq: Three times a day (TID) | ORAL | 0 refills | Status: AC

## 2020-02-21 MED ORDER — PENTAFLUOROPROP-TETRAFLUOROETH EX AERO: 1.0000 "application " | INHALATION_SPRAY | CUTANEOUS | Status: DC | PRN

## 2020-02-21 NOTE — Progress Notes (Signed)
Pt transported off unit to dialysis. Delia Heady RN

## 2020-02-21 NOTE — Progress Notes (Signed)
Patient refused to come to HD until after breakfast, will check back shortly.

## 2020-02-21 NOTE — Discharge Summary (Signed)
Physician Discharge Summary  Natasha [name] AES:[id number] DOB: [birth year] DOA: 02/15/2020  PCP: [name], MD  Admit date: 02/15/2020 Discharge date: 02/21/2020  Admitted From: Home  Disposition: Home   Recommendations for Outpatient Follow-up:  1. Follow up with PCP in 1-2 weeks 2. Please obtain BMP/CBC in one week 3. Follow up with Pulmonary for bronchoscopy October 5th 4. Follow up with Dr Julien Nordmann and radiation oncology   Home Health: Resume [facility]  Discharge Condition: Stable.  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 fib who presents to the emergency department with worsening headache since 1 week. Evaluation in the ED raise concern for brain mets.  Medical oncology, pulmonology has been consulted.  MRI showed; interval development of multifocal area of T2 flair hyperintensity involving both cerebral and cerebellar hemisphere, consistent with vasogenic edema related to new metastatic disease.  1-Brain metastasis/headache: -Likely primary is lung.  Patient is known to have a left lung mass. -Oncology has been consulted.  Radiation oncology and pulmonology has been also consulted. -Patient was a started on dexamethasone. -Plan to proceed with bronchoscopy out patient, tentatively schedule for 10/05. -Discussed with PA, Ashlyn Burning, patient need MRI brain with contrast 3 T, and after HD after contrast.  -Had MRI, with contrast. Underwent HD 9/27---9/28 prior to discharge and again 9/30 her regular schedule.  -short course of pain medication, opioid.   2-Lung mass/concern for primary lung cancer now with  possible mets to the brain; Patient noted to have area of concern in the left lung. Bronchoscopy was discussed in the past with patient but she declined. Plan for bronchoscopy out patient.   3-Acute on chronic anemia/chronic GI blood loss No active bleeding noted.  Gastroenterology was consulted.  No plans for any studies at this time Eliquis has been placed on hold. Patient received blood transfusion with hemodialysis Hemoglobin decreased to 6.9.  Posttransfusion increased to 8.4 Hb stable at 8.   4-ESRD on hemodialysis on MWF  Nephrology is following Chronic hypotension: Continue with midodrine.  5-Chronic respiratory failure with hypoxia secondary to COPD: She uses 3 to 4 L of oxygen at home. Stable from respiratory standpoint  6-History of Crohn's disease: She is status post surgery previously. Recent endoscopy and colonoscopy. Currently on dexamethasone  7-Paroxysmal A. Fib: Continue with With Cardizem.  resume eliquis at discharge.   8-Hypothyroidism: Continue with Synthroid  9-History of PE DVT: resume eliquis at discharge. [plan to stop oct 2 prior bronchoscopy Nausea; continue with Zofran and phenergan PRN./  On PPI.   Nausea, abdominal pain; indigestion. Improve. not moving her bowel as usual. Plan for dulcolax/Sorbitol PRN.   Discharge Diagnoses:  Principal Problem:   Headache Active Problems:   ESRD on dialysis ([place])   Hypothyroidism   Thrombocytopenia (HCC)   COPD (chronic obstructive pulmonary disease) (HCC)   Chronic respiratory failure with hypoxia (HCC)   Metastatic lung carcinoma, left (HCC)   Acute blood loss anemia   GERD (gastroesophageal reflux disease)   Metastasis to brain [facility])   Brain lesion   Palliative care by specialist   DNR (do not resuscitate)    Discharge Instructions  Discharge Instructions    Diet - low sodium heart healthy   Complete by:  As directed    Increase activity slowly   Complete by: As directed    No  wound care   Complete by: As directed      Allergies as of 02/21/2020      Reactions   Mercaptopurine Other (See Comments)   Caused pancreatitis   Remicade [infliximab] Other (See Comments)   CAUSED JOINT PAIN      Medication List    STOP taking these medications   levofloxacin 250 MG tablet Commonly known as: LEVAQUIN   loperamide 2 MG capsule Commonly known as: IMODIUM   predniSONE 5 MG tablet Commonly known as: DELTASONE     TAKE these medications   acetaminophen 500 MG tablet Commonly known as: TYLENOL Take 1,000 mg by mouth every 6 (six) hours as needed for mild pain.   albuterol 108 (90 Base) MCG/ACT inhaler Commonly known as: VENTOLIN HFA INHALE 2 PUFFS BY MOUTH EVERY 6 HOURS IF NEEDED FOR WHEEZING OR SHORTNESS OF BREATH What changed:   how much to take  how to take this  when to take this  reasons to take this   calcitRIOL 0.25 MCG capsule Commonly known as: ROCALTROL Take 1 capsule (0.25 mcg total) by mouth every Monday, Wednesday, and Friday with hemodialysis.   calcium acetate 667 MG capsule Commonly known as: PHOSLO Take 1 capsule (667 mg total) by mouth 3 (three) times daily with meals.   calcium-vitamin D 500-200 MG-UNIT Tabs tablet Commonly known as: OSCAL WITH D Take 1 tablet by mouth as needed (with dialysis).   cyanocobalamin 1000 MCG/ML injection Commonly known as: (VITAMIN B-12) Inject 1,000 mcg into the muscle every 30 (thirty) days.   dexamethasone 4 MG tablet Commonly known as: DECADRON Take 1 tablet (4 mg total) by mouth 3 (three) times daily.   diltiazem 30 MG tablet Commonly known as: CARDIZEM Take 1 tablet (30 mg total) by mouth every 8 (eight) hours.   Eliquis 5 MG Tabs tablet Generic drug: apixaban Take 2.5 mg by mouth 2 (two) times daily. Takes 1/2 tablet (2.1m) bid   fluticasone 50 MCG/ACT nasal spray Commonly known as: FLONASE Place 1 spray into both nostrils daily as needed for allergies.   Humira 40 MG/0.8ML  Pskt Generic drug: Adalimumab Inject 40 mg into the skin every 14 (fourteen) days.   ipratropium-albuterol 0.5-2.5 (3) MG/3ML Soln Commonly known as: DUONEB Take 3 mLs by nebulization every 6 (six) hours as needed. What changed: reasons to take this   levothyroxine 125 MCG tablet Commonly known as: SYNTHROID Take 125 mcg by mouth daily before breakfast.   lidocaine-prilocaine cream Commonly known as: EMLA Apply 1 application topically every Monday, Wednesday, and Friday.   metoprolol tartrate 25 MG tablet Commonly known as: LOPRESSOR Take 25 mg by mouth 2 (two) times daily.   midodrine 10 MG tablet Commonly known as: PROAMATINE Take 1 tablet (10 mg total) by mouth 3 (three) times daily with meals.   Nebulizer Devi 1 Device by Does not apply route as needed.   nitroGLYCERIN 0.4 MG SL tablet Commonly known as: NITROSTAT Place 1 tablet (0.4 mg total) under the tongue every 5 (five) minutes as needed for chest pain.   ondansetron 4 MG disintegrating tablet Commonly known as: Zofran ODT Take 1 tablet (4 mg total) by mouth every 8 (eight) hours as needed for nausea or vomiting.   oxyCODONE 5 MG immediate release tablet Commonly known as: Oxy IR/ROXICODONE Take 1 tablet (5 mg total) by mouth every 4 (four) hours as  needed for moderate pain.   OXYGEN Inhale 2-3 L into the lungs See admin instructions. Use every night and during the day as needed for shortness of breath   pantoprazole 40 MG tablet Commonly known as: Protonix Take 1 tablet (40 mg total) by mouth 2 (two) times daily.   sorbitol 70 % Soln Take 30 mLs by mouth daily as needed for moderate constipation.   sorbitol 70 % solution Take 15 mLs by mouth daily as needed.   Trelegy Ellipta 100-62.5-25 MCG/INH Aepb Generic drug: Fluticasone-Umeclidin-Vilant Inhale 1 puff into the lungs daily.            Durable Medical Equipment  (From admission, onward)         Start     Ordered   02/18/20 1058  For  home use only DME Tub bench  Once        02/18/20 1057   02/18/20 1058  For home use only DME 3 n 1  Once        02/18/20 1057   02/18/20 1040  For home use only DME 4 wheeled rolling walker with seat  Once       Question:  Patient needs a walker to treat with the following condition  Answer:  Weakness   02/18/20 1039          Follow-up Information    [facility] Follow up.   Specialty: Home Health Services Why: The home health agency will contact you for the first home visit. Contact information: [place] [address] [phone]        [facility] Follow up.   Why: The palliative care will contact you for the first home visit. Contact information: [place] [address] [phone]             Allergies  Allergen Reactions  . Mercaptopurine Other (See Comments)    Caused pancreatitis  . Remicade [Infliximab] Other (See Comments)    CAUSED JOINT PAIN    Consultations: Nephrology CCM  Procedures/Studies: CT Head Wo Contrast  Result Date: 02/15/2020 CLINICAL DATA:  Headache.  Lung cancer. EXAM: CT HEAD WITHOUT CONTRAST TECHNIQUE: Contiguous axial images were obtained from the base of the skull through the vertex without intravenous contrast. COMPARISON:  MRI 12/01/2019 FINDINGS: Brain: There is an area of low-density noted in the right parietal subcortical white matter, new since prior MRI. Given history of lung cancer, cannot exclude metastasis in this region. No hemorrhage or hydrocephalus. Vascular: No hyperdense vessel or unexpected calcification. Skull: No acute calvarial abnormality. Sinuses/Orbits: Visualized paranasal sinuses and mastoids clear. Orbital soft tissues unremarkable. Other: None IMPRESSION: Area of subcortical low-density in the right parietal lobe. Cannot exclude focal lesion/metastasis. Consider further evaluation with MRI with and without contrast. Electronically Signed   By:  Rolm Baptise M.D.   On: 02/15/2020 11:42   MR BRAIN WO CONTRAST  Result Date: 02/16/2020 CLINICAL DATA:  Initial evaluation for possible brain mass. EXAM: MRI HEAD WITHOUT CONTRAST TECHNIQUE: Multiplanar, multiecho pulse sequences of the brain and surrounding structures were obtained without intravenous contrast. COMPARISON:  Comparison made with prior CT from earlier same day as well as recent brain MRI from 12/01/2019. FINDINGS: Brain: There has been interval development of multifocal areas of T2/FLAIR hyperintensity involving both cerebral and cerebellar hemispheres, consistent with vasogenic edema related to new metastatic disease. These foci are predominantly positioned along the gray-white matter differentiation. Largest area within the left cerebral hemisphere seen at  the anterior left frontal lobe and measures 18 mm (series 11, image 17). Largest area within the right cerebral hemisphere seen at the right temporal occipital junction and measures 2.3 cm (series 11, image 12). Largest infratentorial area seen within r the right cerebellum and measures 1.7 cm. No significant regional mass effect about any of these foci at this time. There are at least 10 lesions in total. Evaluation for underlying lesion size limited given lack of IV contrast, although 1 lesion positioned at the subcortical right parietal lobe measures 5 mm, measured well on T2 weighted sequence (series 10, image 16). Few scattered foci of susceptibility artifacts seen associated with these lesions, consistent with necrosis and/or hemorrhage. Underlying atrophy with chronic microvascular ischemic disease again noted. No evidence for acute or subacute infarct. Gray-white matter differentiation otherwise maintained. Ventricles within normal limits without hydrocephalus. No extra-axial fluid collection. Pituitary gland and suprasellar region within normal limits. Midline structures intact. Vascular: Major intracranial vascular flow voids are  maintained. Skull and upper cervical spine: Craniocervical junction within normal limits. Bone marrow signal intensity normal. No focal marrow replacing lesion. No scalp soft tissue abnormality. Sinuses/Orbits: Patient status post bilateral ocular lens replacement. Paranasal sinuses are largely clear. No significant mastoid effusion. Other: None. IMPRESSION: Interval development of multifocal areas of T2/FLAIR hyperintensity involving both cerebral and cerebellar hemispheres, consistent with vasogenic edema related to new metastatic disease. No significant regional mass effect at this time. Follow-up examination with postcontrast imaging could be performed for complete assessment as clinically warranted. Please note that the risk of NSF is considered to be negligible with the use of group II agents (Gadavist). Electronically Signed   By: Jeannine Boga M.D.   On: 02/16/2020 01:05   MR BRAIN W WO CONTRAST  Result Date: 02/20/2020 CLINICAL DATA:  Metastatic disease. Staging. Patient with known diagnosis of lung cancer. EXAM: MRI HEAD WITHOUT AND WITH CONTRAST TECHNIQUE: Multiplanar, multiecho pulse sequences of the brain and surrounding structures were obtained without and with intravenous contrast. CONTRAST:  65m GADAVIST GADOBUTROL 1 MMOL/ML IV SOLN COMPARISON:  MRI 02/15/2020 FINDINGS: Brain: Multiple peripherally enhancing metastatic lesions on series 9, including: *6 mm lesion in the left frontal lobe (image 21). *6 mm lesion in the high left parietal lobe (image 118). *2 mm lesion in the high left frontal lobe (image 112). *6 mm lesion in the right parieto-occipital region (image 95). *Punctate lesion in the posterior left temporal lobe (image 84). *7 mm lesion in the posterior right temporal lobe (image 76) with additional tiny adjacent lesion (image 72). *Approximately 7 mm lesion in the anterior right temporal lobe (image 51) with limited evaluation given motion. *5 mm lesion in the right cerebellar  hemisphere (image 35) *4 mm lesion in the cerebellar vermis (image 56) with likely additional punctate additional lesion (image 60). These lesions have associated mild surrounding vasogenic edema without substantial mass effect. No midline shift. Basal cisterns are patent. Some of these lesions demonstrate mild susceptibility artifact, compatible with internal hemorrhage. Otherwise, no acute hemorrhage. No acute infarct. Mild cerebral atrophy with ex vacuo ventricular dilation. No hydrocephalus. Vascular: Major arterial flow voids are maintained at the skull base. Skull and upper cervical spine: Normal marrow signal. Sinuses/Orbits: Negative. Other: No mastoid effusion. IMPRESSION: Numerous infratentorial and supratentorial enhancing metastatic lesions, as detailed above. Mild associated vasogenic edema without substantial mass effect. Electronically Signed   By: FMargaretha SheffieldMD   On: 02/20/2020 09:16   DG Chest Port 1 View  Result Date: 01/30/2020 CLINICAL  DATA:  Shortness of breath.  COPD.  On dialysis. EXAM: PORTABLE CHEST 1 VIEW COMPARISON:  11/18/2019 plain film and CT. FINDINGS: Hyperinflation. Left axillary vascular stent. Surgical clips in the lower neck. Mild cardiomegaly. Atherosclerosis in the transverse aorta. Left perihilar lung nodule has enlarged, 2.8 cm today versus 2.1 cm on 11/18/2019. No pleural effusion or pneumothorax. Numerous leads and wires project over the chest. No lobar consolidation. IMPRESSION: Hyperinflation, without acute superimposed disease. Aortic Atherosclerosis (ICD10-I70.0). Enlarging left upper lobe lung nodule, as on prior exams (metastatic disease per CT of 11/18/2019). Electronically Signed   By: Abigail Miyamoto M.D.   On: 01/30/2020 17:30   CT Super D Chest Wo Contrast  Result Date: 02/17/2020 CLINICAL DATA:  Left upper lobe lung mass preop for EBUS. EXAM: CT CHEST WITHOUT CONTRAST TECHNIQUE: Multidetector CT imaging of the chest was performed using thin slice  collimation for electromagnetic bronchoscopy planning purposes, without intravenous contrast. COMPARISON:  Chest CT 11/18/2019 FINDINGS: Cardiovascular: The heart is normal in size. No pericardial effusion. There is mild tortuosity and moderate calcification of the thoracic aorta which is stable. Enlarged pulmonary artery suggesting pulmonary hypertension. Mediastinum/Nodes: Small scattered mediastinal and hilar lymph nodes appears stable. No obvious adenopathy on this noncontrast examination. The esophagus is grossly normal. Lungs/Pleura: Stable severe emphysematous changes and hit stable areas of pulmonary scarring. The left upper lobe lung mass measures 3.3 x 3.0 cm and previously measured 2.2 x 2.0 cm. More superiorly in the left upper lobe there is a stable 6 mm nodule. Subpleural nodular lesion in the left upper lobe on image 73/4 measures 6 mm and appears stable. Upper Abdomen: No significant upper abdominal findings. Musculoskeletal: No significant bony findings. IMPRESSION: 1. Interval enlargement of the left upper lobe lung mass, now measuring 3.3 x 3.0 cm. 2. Stable 6 mm left upper lobe pulmonary nodules. 3. Stable severe emphysematous changes and pulmonary scarring. 4. Enlarged pulmonary artery suggesting pulmonary hypertension. 5. Stable atherosclerotic calcifications involving the thoracic aorta and branch vessels. 6. Emphysema and aortic atherosclerosis. Aortic Atherosclerosis (ICD10-I70.0) and Emphysema (ICD10-J43.9). Electronically Signed   By: Marijo Sanes M.D.   On: 02/17/2020 18:44      Subjective: Report mild abdominal pain, not moving her bowel how she usually do. Tolerated diet.   Discharge Exam: Vitals:   02/21/20 0423 02/21/20 0828  BP: 119/61 (!) 113/49  Pulse: (!) 57 69  Resp: 18 19  Temp: 97.8 F (36.6 C) 97.9 F (36.6 C)  SpO2: 100% 100%     General: Pt is alert, awake, not in acute distress Cardiovascular: RRR, S1/S2 +, no rubs, no gallops Respiratory: CTA  bilaterally, no wheezing, no rhonchi Abdominal: Soft, NT, ND, bowel sounds + Extremities: no edema, no cyanosis    The results of significant diagnostics from this hospitalization (including imaging, microbiology, ancillary and laboratory) are listed below for reference.     Microbiology: Recent Results (from the past 240 hour(s))  SARS Coronavirus 2 by RT PCR (hospital order, performed in [facility] hospital lab) Nasopharyngeal Nasopharyngeal Swab     Status: None   Collection Time: 02/15/20  2:10 PM   Specimen: Nasopharyngeal Swab  Result Value Ref Range Status   SARS Coronavirus 2 NEGATIVE NEGATIVE Final    Comment: (NOTE) SARS-CoV-2 target nucleic acids are NOT DETECTED.  The SARS-CoV-2 RNA is generally detectable in upper and lower respiratory specimens during the acute phase of infection. The lowest concentration of SARS-CoV-2 viral copies this assay can detect is 250 copies / mL.  A negative result does not preclude SARS-CoV-2 infection and should not be used as the sole basis for treatment or other patient management decisions.  A negative result may occur with improper specimen collection / handling, submission of specimen other than nasopharyngeal swab, presence of viral mutation(s) within the areas targeted by this assay, and inadequate number of viral copies (<250 copies / mL). A negative result must be combined with clinical observations, patient history, and epidemiological information.  Fact Sheet for Patients:   StrictlyIdeas.no  Fact Sheet for Healthcare Providers: BankingDealers.co.za  This test is not yet approved or  cleared by the [place] FDA and has been authorized for detection and/or diagnosis of SARS-CoV-2 by FDA under an Emergency Use Authorization (EUA).  This EUA will remain in effect (meaning this test can be used) for the duration of the COVID-19 declaration under Section 564(b)(1) of the Act,  21 U.S.C. section 360bbb-3(b)(1), unless the authorization is terminated or revoked sooner.  Performed at [facility], [place] [address], [place], [place] [address]      Labs: BNP (last 3 results) Recent Labs    01/30/20 1741  BNP 500.9*   Basic Metabolic Panel: Recent Labs  Lab 02/15/20 1410 02/16/20 0500 02/17/20 0308 02/18/20 0407 02/19/20 0500 02/20/20 0316 02/21/20 0406  NA  --    < > 136 137 134* 132* 136  K  --    < > 3.4* 4.1 4.5 4.8 4.3  CL  --    < > 98 102 100 98 96*  CO2  --    < > 24 24 22  18* 26  GLUCOSE  --    < > 113* 114* 117* 138* 101*  BUN  --    < > 22 29* 69* 104* 49*  CREATININE  --    < > 5.39* 5.26* 7.70* 10.58* 6.16*  CALCIUM  --    < > 8.6* 8.8* 8.9 8.6* 8.5*  MG 1.6*  --   --   --   --   --   --   PHOS 8.2*  --   --   --   --   --   --    < > = values in this interval not displayed.   Liver Function Tests: Recent Labs  Lab 02/15/20 1110 02/16/20 0500  AST 18 20  ALT 14 16  ALKPHOS 41 44  BILITOT 0.8 1.2  PROT 6.4* 6.0*  ALBUMIN 3.2* 2.9*   No results for input(s): LIPASE, AMYLASE in the last 168 hours. No results for input(s): AMMONIA in the last 168 hours. CBC: Recent Labs  Lab 02/17/20 0308 02/18/20 0407 02/19/20 0500 02/20/20 0316 02/21/20 0406  WBC 3.5* 8.6 9.4 9.3 7.1  HGB 8.4* 8.1* 7.7* 8.1* 8.7*  HCT 26.6* 26.6* 25.4* 26.2* 28.0*  MCV 94.0 96.7 98.1 97.8 96.6  PLT 143* 152 168 190 147*   Cardiac Enzymes: No results for input(s): CKTOTAL, CKMB, CKMBINDEX, TROPONINI in the last 168 hours. BNP: Invalid input(s): POCBNP CBG: Recent Labs  Lab 02/17/20 0454 02/17/20 1115 02/17/20 1742 02/17/20 2026 02/19/20 0922  GLUCAP 111* 71 121* 117* 89   D-Dimer No results for input(s): DDIMER in the last 72 hours. Hgb A1c No results for input(s): HGBA1C in the last 72 hours. Lipid Profile No results for input(s): CHOL, HDL, LDLCALC, TRIG, CHOLHDL, LDLDIRECT in the last 72 hours. Thyroid function studies No  results for input(s): TSH, T4TOTAL, T3FREE, THYROIDAB in the last 72 hours.  Invalid input(s): FREET3 Anemia work up No results for input(s): VITAMINB12, FOLATE, FERRITIN, TIBC, IRON, RETICCTPCT in the last 72 hours. Urinalysis    Component Value Date/Time   COLORURINE YELLOW 11/18/2019 2138   APPEARANCEUR CLEAR 11/18/2019 2138   LABSPEC 1.019 11/18/2019 2138   PHURINE 6.0 11/18/2019 2138   GLUCOSEU 50 (A) 11/18/2019 2138   HGBUR MODERATE (A) 11/18/2019 2138   BILIRUBINUR NEGATIVE 11/18/2019 2138   [place] NEGATIVE 11/18/2019 2138   PROTEINUR 30 (A) 11/18/2019 2138   UROBILINOGEN 0.2 09/09/2014 0850   NITRITE NEGATIVE 11/18/2019 2138   LEUKOCYTESUR NEGATIVE 11/18/2019 2138   Sepsis Labs Invalid input(s): PROCALCITONIN,  WBC,  LACTICIDVEN Microbiology Recent Results (from the past 240 hour(s))  SARS Coronavirus 2 by RT PCR (hospital order, performed in [place] hospital lab) Nasopharyngeal Nasopharyngeal Swab     Status: None   Collection Time: 02/15/20  2:10 PM   Specimen: Nasopharyngeal Swab  Result Value Ref Range Status   SARS Coronavirus 2 NEGATIVE NEGATIVE Final    Comment: (NOTE) SARS-CoV-2 target nucleic acids are NOT DETECTED.  The SARS-CoV-2 RNA is generally detectable in upper and lower respiratory specimens during the acute phase of infection. The lowest concentration of SARS-CoV-2 viral copies this assay can detect is 250 copies / mL. A negative result does not preclude SARS-CoV-2 infection and should not be used as the sole basis for treatment or other patient management decisions.  A negative result may occur with improper specimen collection / handling, submission of specimen other than nasopharyngeal swab, presence of viral mutation(s) within the areas targeted by this assay, and inadequate number of viral copies (<250 copies / mL). A negative result must be combined with clinical observations, patient history, and epidemiological information.  Fact  Sheet for Patients:   StrictlyIdeas.no  Fact Sheet for Healthcare Providers: BankingDealers.co.za  This test is not yet approved or  cleared by the [place] FDA and has been authorized for detection and/or diagnosis of SARS-CoV-2 by FDA under an Emergency Use Authorization (EUA).  This EUA will remain in effect (meaning this test can be used) for the duration of the COVID-19 declaration under Section 564(b)(1) of the Act, 21 U.S.C. section 360bbb-3(b)(1), unless the authorization is terminated or revoked sooner.  Performed at [facility], [place] [address], [place], [place] [address]      Time coordinating discharge: 40 minutes  SIGNED:   Elmarie Shiley, MD  [facility]

## 2020-02-21 NOTE — Progress Notes (Signed)
Pt back to unit from dialysis. Pt alert and verbally responsive. Pt states she's ready to go home. Pt asked to call ride in preparation for discharge home. Delia Heady RN   02/21/20 1615  Vitals  Temp 97.7 F (36.5 C)  Temp Source Oral  BP (!) 102/54  MAP (mmHg) 66  BP Location Right Arm  BP Method Automatic  Patient Position (if appropriate) Sitting  Pulse Rate 69  Pulse Rate Source Dinamap  Resp 17  Level of Consciousness  Level of Consciousness Alert  Oxygen Therapy  SpO2 100 %  Pain Assessment  Pain Scale 0-10  Pain Score 0

## 2020-02-21 NOTE — Progress Notes (Signed)
OT Cancellation Note  Patient Details Name: ADDILEE NEU MRN: [id number] DOB: 01/10/1946   Cancelled Treatment:    Reason Eval/Treat Not Completed: Patient at procedure or test/ unavailable (HD currently)  Jeri Modena 02/21/2020, 2:04 PM   Fleeta Emmer, OTR/L  Acute Rehabilitation Services Pager: [phone] Office: [phone] .

## 2020-02-21 NOTE — Progress Notes (Signed)
Initial Nutrition Assessment  DOCUMENTATION CODES:   Not applicable  INTERVENTION:  Renavite daily  Nepro Shake po BID, each supplement provides 425 kcal and 19 grams protein  NUTRITION DIAGNOSIS:   Increased nutrient needs related to chronic illness (ESRD on HD) as evidenced by estimated needs.    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 Pt with known lung mass with concern for primary lung cancer now with possible mets to the brain. PMH also includes ESRD on HD, Crohn's disease, anemia of chronic disease, thrombocytopenia, chronic hypoxic respiratory failure 2/2 underlying COPD on 3L O2 at baseline, PE DVT, HTN,HLD, Afib.  Pt unavailable at time of RD visit. Pt at HD, which will be the last of 3 serial HD sessions following the MRI with contrast. Pt to d/c after HD and return to typical outpatient schedule. Pt to have outpatient bronchoscopy on 10/5.   Pt has been eating 100% of all meals.   Per Nephrology, pt has been leaving consistently 2kg above EDW and may need it to be raised at d/c.   EDW 49.5 kg Current wt 54.9 kg Last HD 9/27, net UF 3L  Labs reviewed. Medications: Dulcolax, Phoslo, decadron, Hectorol, Zofran, Protonix  Diet Order:   Diet Order            Diet - low sodium heart healthy           Diet renal with fluid restriction Fluid restriction: 1200 mL Fluid; Room service appropriate? Yes; Fluid consistency: Thin  Diet effective now                 EDUCATION NEEDS:   No education needs have been identified at this time  Skin:  Skin Assessment: Reviewed RN Assessment  Last BM:  9/25  Height:   Ht Readings from Last 1 Encounters:  02/15/20 5' 4"  (1.626 m)    Weight:   Wt Readings from Last 1  Encounters:  02/21/20 56 kg    BMI:  Body mass index is 21.19 kg/m.  Estimated Nutritional Needs:   Kcal:  1500-1700  Protein:  75-95 grams  Fluid:  1 L + UOP    Larkin Ina, MS, RD, LDN RD pager number and weekend/on-call pager number located in [place].

## 2020-02-21 NOTE — Progress Notes (Signed)
[name] Progress Note   Subjective:   Patient seen and examined at bedside during dialysis.  Tolerating treatment well so far.  Feeling a little nauseated after eating lunch.  Otherwise doing ok.    Objective Vitals:   02/21/20 1130 02/21/20 1200 02/21/20 1230 02/21/20 1300  BP: 137/60 137/68 (!) 164/93 123/64  Pulse: 65 (!) 56 80 (!) 57  Resp:  16 18 13   Temp:      TempSrc:      SpO2:      Weight:      Height:       Physical Exam General:WDWN, pleasant, thin, elderly female in NAD Heart:RRR Lungs:grossly CTAB Abdomen:soft, NTND Extremities:no LE edema Dialysis Access: LU AVG cannulated    Filed Weights   02/20/20 1830 02/21/20 0423 02/21/20 1036  Weight: 53.9 kg 54.9 kg 56 kg    Intake/Output Summary (Last 24 hours) at 02/21/2020 1325 Last data filed at 02/20/2020 1830 Gross per 24 hour  Intake --  Output 3000 ml  Net -3000 ml    Additional Objective Labs: Basic Metabolic Panel: Recent Labs  Lab 02/15/20 1410 02/16/20 0500 02/19/20 0500 02/20/20 0316 02/21/20 0406  NA  --    < > 134* 132* 136  K  --    < > 4.5 4.8 4.3  CL  --    < > 100 98 96*  CO2  --    < > 22 18* 26  GLUCOSE  --    < > 117* 138* 101*  BUN  --    < > 69* 104* 49*  CREATININE  --    < > 7.70* 10.58* 6.16*  CALCIUM  --    < > 8.9 8.6* 8.5*  PHOS 8.2*  --   --   --   --    < > = values in this interval not displayed.   Liver Function Tests: Recent Labs  Lab 02/15/20 1110 02/16/20 0500  AST 18 20  ALT 14 16  ALKPHOS 41 44  BILITOT 0.8 1.2  PROT 6.4* 6.0*  ALBUMIN 3.2* 2.9*   CBC: Recent Labs  Lab 02/17/20 0308 02/17/20 0308 02/18/20 0407 02/18/20 0407 02/19/20 0500 02/20/20 0316 02/21/20 0406  WBC 3.5*   < > 8.6   < > 9.4 9.3 7.1  HGB 8.4*   < > 8.1*   < > 7.7* 8.1* 8.7*  HCT 26.6*   < > 26.6*   < > 25.4* 26.2* 28.0*  MCV 94.0  --  96.7  --  98.1 97.8 96.6  PLT 143*   < > 152   < > 168 190 147*   < > = values in this interval not displayed.    CBG: Recent Labs  Lab 02/17/20 0454 02/17/20 1115 02/17/20 1742 02/17/20 2026 02/19/20 0922  GLUCAP 111* 71 121* 117* 89   Studies/Results: MR BRAIN W WO CONTRAST  Result Date: 02/20/2020 CLINICAL DATA:  Metastatic disease. Staging. Patient with known diagnosis of lung cancer. EXAM: MRI HEAD WITHOUT AND WITH CONTRAST TECHNIQUE: Multiplanar, multiecho pulse sequences of the brain and surrounding structures were obtained without and with intravenous contrast. CONTRAST:  51m GADAVIST GADOBUTROL 1 MMOL/ML IV SOLN COMPARISON:  MRI 02/15/2020 FINDINGS: Brain: Multiple peripherally enhancing metastatic lesions on series 9, including: *6 mm lesion in the left frontal lobe (image 21). *6 mm lesion in the high left parietal lobe (image 118). *2 mm lesion in the high left frontal lobe (image 112). *6 mm lesion  in the right parieto-occipital region (image 95). *Punctate lesion in the posterior left temporal lobe (image 84). *7 mm lesion in the posterior right temporal lobe (image 76) with additional tiny adjacent lesion (image 72). *Approximately 7 mm lesion in the anterior right temporal lobe (image 51) with limited evaluation given motion. *5 mm lesion in the right cerebellar hemisphere (image 35) *4 mm lesion in the cerebellar vermis (image 56) with likely additional punctate additional lesion (image 60). These lesions have associated mild surrounding vasogenic edema without substantial mass effect. No midline shift. Basal cisterns are patent. Some of these lesions demonstrate mild susceptibility artifact, compatible with internal hemorrhage. Otherwise, no acute hemorrhage. No acute infarct. Mild cerebral atrophy with ex vacuo ventricular dilation. No hydrocephalus. Vascular: Major arterial flow voids are maintained at the skull base. Skull and upper cervical spine: Normal marrow signal. Sinuses/Orbits: Negative. Other: No mastoid effusion. IMPRESSION: Numerous infratentorial and supratentorial enhancing  metastatic lesions, as detailed above. Mild associated vasogenic edema without substantial mass effect. Electronically Signed   By: Margaretha Sheffield MD   On: 02/20/2020 09:16    Medications:  sodium chloride     sodium chloride      sodium chloride   Intravenous Once   bisacodyl  5 mg Oral Once   calcium acetate  667 mg Oral TID WC   Chlorhexidine Gluconate Cloth  6 each Topical Q0600   Chlorhexidine Gluconate Cloth  6 each Topical Q0600   dexamethasone (DECADRON) injection  4 mg Intravenous Q8H   diltiazem  30 mg Oral Q8H   doxercalciferol  3 mcg Intravenous Q M,W,F-HD   fluticasone furoate-vilanterol  1 puff Inhalation Daily   And   umeclidinium bromide  1 puff Inhalation Daily   levothyroxine  125 mcg Oral Q0600   metoprolol tartrate  25 mg Oral BID   midodrine  10 mg Oral TID WC   pantoprazole  40 mg Oral BID    Dialysis Orders: [facility] MWF 4 hrs 300/800 49.5kg 3.0 K/ 2.25 Ca UFP 3 Hep none L AVG -Hectorol 3 mcg IV TIW -No ESAD/T Lung mass/possible malignancy.  post HD wts running 51.5 on average  Assessment/Plan: 1. Acute on Chronic Anemia- FOBT +. HGB improved to 8.7 todays/p 1 unit pRBC. No ESA 2/2 malignancy no heparin HD 2. 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 MRI with contrast completed. Now requires serial HD after contrast exposure, HD yesterday and today here. Resume regular schedule tomorrow. She is scheduled for video bronch 10/5.   3. Chronic hypoxic resp failure - seen by Pulm 4. ESRD - MWF Serial HD after contrast exposure, MTWF 5. Hypertension/volume - continue Midodrine 10 mg PO TIW.  Minimal UF removed last HD, tolerating UF 2L today.  Needs standing weights to better assess EDW, does not appear volume overloaded but per wt 8kg over dry - leaving consistently about 2 kg above EDW - may need to be raised at d/c continue to trend 6. Metabolic bone diseaseLast P elevated - will recheck as OP, if P still up need  to ^ binders. Calcitriol dc'd, she is on hectorol 3 with HD 7. Severe COPD / emphysema 8. Nutrition -Renal diet with fld restrictions. Albumin low, add protein supps, renal vits.  9. COPD- per primary 10. Chron's disease- per primary 11. DNR - palliative team is going to be involved  Jen Mow, PA-C [place] 02/21/2020,1:25 PM  LOS: 6 days

## 2020-02-21 NOTE — TOC Transition Note (Signed)
Transition of Care [facility]) - CM/SW Discharge Note   Patient Details  Name: Natasha [name] MRN: [id number] Date of Birth: [birth year]  [facility] CM/SW Contact:  Pollie Friar, RN Phone Number: 02/21/2020, 10:08 AM   Clinical Narrative:    Pt is discharging home with [facility] services through [facility]. Amy with [facility] aware of d/c.  [facility] aware of d/c home and will follow for outpatient palliative. DME for home was delivered to the room.  Pt has transportation home.   Final next level of care: Home w Home Health Services Barriers to Discharge: No Barriers Identified   Patient Goals and CMS Choice   CMS Medicare.gov Compare Post Acute Care list provided to:: Patient Choice offered to / list presented to : Patient  Discharge Placement                       Discharge Plan and Services   Discharge Planning Services: CM Consult Post Acute Care Choice: Durable Medical Equipment          DME Arranged: 3-N-1, Walker rolling with seat DME Agency: [employer] Date DME Agency Contacted: 02/18/20   Representative spoke with at DME Agency: Peggye [employer] HH Arranged: PT, OT [place] Date [place]: 02/21/20   Representative spoke with at [place]: Amy aware of d/c home  Social Determinants of Health (SDOH) Interventions     Readmission Risk Interventions Readmission Risk Prevention Plan 04/15/2019  Transportation Screening Complete  Medication Review Press photographer) Complete  [place] Not Applicable  Some recent data might be hidden

## 2020-02-21 NOTE — Progress Notes (Signed)
This chaplain followed up with PMT Pt. spiritual care.  The chaplain visited the Pt. after she completed dialysis on the HD unit.  The chaplain listened to the Pt. as she continued her life story.  The Pt agreed with the chaplain's summary "family is the central thread" connecting both sides of the family through celebrations and death. The chaplain heard the Pt. willingness to take one day at a time.  And when EOL occurs, the Pt. anticipates the youngest son will continue to be present with her husband.  The Pt. and chaplain decide on personal prayer at the end of the visit.

## 2020-02-21 NOTE — Progress Notes (Signed)
Pt discharge education and instructions completed with pt at bedside. Pt voices understanding and denies any questions. Pt IV and telemetry removed. Pt discharged home with family to come transport her home. Pt to pick up electronically sent prescriptions from preferred pharmacy on file. Pt transported off unit via wheelchair on oxygen along with belongings including pt home DME walker and BSC. Delia Heady RN

## 2020-02-22 ENCOUNTER — Telehealth: Payer: Self-pay | Admitting: *Deleted

## 2020-02-22 DIAGNOSIS — C7802 Secondary malignant neoplasm of left lung: Secondary | ICD-10-CM

## 2020-02-22 NOTE — Telephone Encounter (Signed)
I received a message from Mikey Bussing NP that patient needs to be seen sooner with Dr. Julien Nordmann than the Nov appt. I called patient and updated on appt for [place].

## 2020-02-23 ENCOUNTER — Other Ambulatory Visit: Payer: Self-pay

## 2020-02-23 ENCOUNTER — Telehealth: Payer: Self-pay | Admitting: *Deleted

## 2020-02-23 ENCOUNTER — Telehealth: Payer: Self-pay | Admitting: Pulmonary Disease

## 2020-02-23 ENCOUNTER — Inpatient Hospital Stay
Admission: RE | Admit: 2020-02-23 | Discharge: 2020-02-23 | Disposition: A | Discharge status: Home / Self Care | Payer: Medicare Other | Source: Ambulatory Visit | Attending: Radiation Oncology | Admitting: Radiation Oncology

## 2020-02-23 ENCOUNTER — Other Ambulatory Visit: Payer: Self-pay | Admitting: Radiation Therapy

## 2020-02-23 ENCOUNTER — Encounter (HOSPITAL_COMMUNITY): Payer: Self-pay | Admitting: Pulmonary Disease

## 2020-02-23 ENCOUNTER — Other Ambulatory Visit: Payer: Self-pay | Admitting: Urology

## 2020-02-23 ENCOUNTER — Ambulatory Visit
Admission: RE | Admit: 2020-02-23 | Discharge: 2020-02-23 | Disposition: A | Discharge status: Home / Self Care | Payer: Medicare Other | Source: Ambulatory Visit | Attending: Radiation Oncology | Admitting: Radiation Oncology

## 2020-02-23 VITALS — BP 122/58 | HR 94 | Temp 99.0°F | Resp 18 | Ht 64.0 in | Wt 119.0 lb

## 2020-02-23 DIAGNOSIS — C7931 Secondary malignant neoplasm of brain: Secondary | ICD-10-CM | POA: Diagnosis present

## 2020-02-23 DIAGNOSIS — C510 Malignant neoplasm of labium majus: Secondary | ICD-10-CM | POA: Diagnosis present

## 2020-02-23 DIAGNOSIS — J449 Chronic obstructive pulmonary disease, unspecified: Secondary | ICD-10-CM

## 2020-02-23 MED ORDER — LIDOCAINE HCL (PF) 1 % IJ SOLN: 5.0000 mL | INTRAMUSCULAR | Status: DC | PRN

## 2020-02-23 MED ORDER — SODIUM CHLORIDE 0.9 % IV SOLN: 100.0000 mL | INTRAVENOUS | Status: DC | PRN

## 2020-02-23 MED ORDER — HEPARIN SODIUM (PORCINE) 1000 UNIT/ML DIALYSIS: 20.0000 [IU]/kg | INTRAMUSCULAR | Status: DC | PRN

## 2020-02-23 MED ORDER — LIDOCAINE-PRILOCAINE 2.5-2.5 % EX CREA: 1.0000 "application " | TOPICAL_CREAM | CUTANEOUS | Status: DC | PRN

## 2020-02-23 MED ORDER — HEPARIN SODIUM (PORCINE) 1000 UNIT/ML DIALYSIS: 1000.0000 [IU] | INTRAMUSCULAR | Status: DC | PRN

## 2020-02-23 MED ORDER — LORAZEPAM 1 MG PO TABS
1.0000 mg | ORAL_TABLET | ORAL | Status: AC
  Administered 2020-02-23: 1 mg via ORAL
  Filled 2020-02-23: qty 1

## 2020-02-23 MED ORDER — ALTEPLASE 2 MG IJ SOLR: 2.0000 mg | Freq: Once | INTRAMUSCULAR | Status: DC | PRN

## 2020-02-23 MED ORDER — LORAZEPAM 0.5 MG PO TABS: 1.0000 mg | ORAL_TABLET | ORAL | 0 refills | Status: AC | PRN

## 2020-02-23 MED ORDER — PENTAFLUOROPROP-TETRAFLUOROETH EX AERO: 1.0000 "application " | INHALATION_SPRAY | CUTANEOUS | Status: DC | PRN

## 2020-02-23 NOTE — Progress Notes (Signed)
Natasha [name] denies chest pain, patient said that she gets short of breath with ambulation , at times. Patient wears 2 L- 3 L of oxygen 24/7. Natasha [name] takes Eliquis for history of DVT and PE.  Natasha [name] reports that it was stopped for a procedure, but was instructed to start it back.  Natasha [name] stated that she has not been instructed to stop Eliquis for this procedure.  I will send Dr. Valeta Harms a staff message and ask him to have staff call patient if she is to hold medication, I also asked Mr. Peugh to call Dr. Juline Patch office to see if she should hold Eliquis, by Friday afternoon if she has not heard.

## 2020-02-23 NOTE — Addendum Note (Signed)
Encounter addended by: Heywood Footman, RN on: 02/23/2020 1:36 PM  Actions taken: Flowsheet accepted

## 2020-02-23 NOTE — Telephone Encounter (Signed)
Spoke with patient regarding message.Pateint stated Patient is using oxygen 24/7 and it's drying out her nose. Nurse and therapist suggested water bottle humidifer and water trapper for oxygen machine.  Patient stated she would like a order for a shower chair.   Dr.Sood can you please advise.  Thank you.

## 2020-02-23 NOTE — Telephone Encounter (Signed)
I tried to call [name] today but her vm mailbox is full. I am unable to leave a vm message.

## 2020-02-23 NOTE — Telephone Encounter (Signed)
Patient also needs chair for tub. Patient phone number is [phone] h or [phone].

## 2020-02-23 NOTE — Progress Notes (Signed)
  Radiation Oncology         [phone] ________________________________  Name: KEITH CANCIO MRN: [id number]  Date: 02/23/2020  DOB: 1945/10/29  SIMULATION AND TREATMENT PLANNING NOTE    ICD-10-CM   1. Metastasis to brain (HCC)  C79.31 LORazepam (ATIVAN) tablet 1 mg    DIAGNOSIS:  74 yo woman with brain metastases  NARRATIVE:  The patient was brought to the [facility].  Identity was confirmed.  All relevant records and images related to the planned course of therapy were reviewed.  The patient freely provided informed written consent to proceed with treatment after reviewing the details related to the planned course of therapy. The consent form was witnessed and verified by the simulation staff. Intravenous access was established for contrast administration. Then, the patient was set-up in a stable reproducible supine position for radiation therapy.  A relocatable thermoplastic stereotactic head frame was fabricated for precise immobilization.  CT images were obtained.  Surface markings were placed.  The CT images were loaded into the planning software and fused with the patient's targeting MRI scan.  Then the target and avoidance structures were contoured.  Treatment planning then occurred.  The radiation prescription was entered and confirmed.  I have requested 3D planning  I have requested a DVH of the following structures: Brain stem, brain, left eye, right eye, lenses, optic chiasm, target volumes, uninvolved brain, and normal tissue.    SPECIAL TREATMENT PROCEDURE:  The planned course of therapy using radiation constitutes a special treatment procedure. Special care is required in the management of this patient for the following reasons. This treatment constitutes a Special Treatment Procedure for the following reason: High dose per fraction requiring special monitoring for increased toxicities of treatment including daily imaging.  The special nature of the planned  course of radiotherapy will require increased physician supervision and oversight to ensure patient's safety with optimal treatment outcomes.  PLAN:  The patient will receive 20 Gy in 1 fraction to multiple targets using one isocenter, pending updated MRI  ________________________________  Sheral Apley. Tammi Klippel, M.D.

## 2020-02-24 ENCOUNTER — Telehealth: Payer: Self-pay | Admitting: *Deleted

## 2020-02-24 ENCOUNTER — Encounter (HOSPITAL_COMMUNITY): Payer: Self-pay | Admitting: Pulmonary Disease

## 2020-02-24 ENCOUNTER — Telehealth: Payer: Self-pay | Admitting: Pulmonary Disease

## 2020-02-24 NOTE — Telephone Encounter (Signed)
See previous messages. Patient has been called but did not answer the phone.

## 2020-02-24 NOTE — Anesthesia Preprocedure Evaluation (Addendum)
Anesthesia Evaluation  Patient identified by MRN, date of birth, ID band Patient awake    Reviewed: Allergy & Precautions, NPO status , Patient's Chart, lab work & pertinent test results, reviewed documented beta blocker date and time   Airway Mallampati: II  TM Distance: >3 FB Neck ROM: Full    Dental  (+) Partial Upper, Dental Advisory Given   Pulmonary shortness of breath and with exertion, pneumonia, resolved, COPD,  COPD inhaler, former smoker,  Covid 11/20 Lung mass Metastatic disease brain & bone   Pulmonary exam normal breath sounds clear to auscultation       Cardiovascular hypertension, Pt. on medications + Peripheral Vascular Disease and +CHF  Normal cardiovascular exam+ dysrhythmias Atrial Fibrillation  Rhythm:Regular Rate:Normal     Neuro/Psych  Headaches, Seizures -,  Anxiety    GI/Hepatic GERD  Medicated and Controlled,  Endo/Other  Hypothyroidism   Renal/GU ESRF and DialysisRenal diseaseHD MWF- last dialysed 10/1  negative genitourinary   Musculoskeletal  (+) Arthritis , Eliquis therapy- last dose 10/2   Abdominal   Peds  Hematology  (+) anemia ,   Anesthesia Other Findings   Reproductive/Obstetrics                            Anesthesia Physical Anesthesia Plan  ASA: IV  Anesthesia Plan: General   Post-op Pain Management:    Induction: Intravenous  PONV Risk Score and Plan: 3 and Treatment may vary due to age or medical condition  Airway Management Planned: Oral ETT  Additional Equipment:   Intra-op Plan:   Post-operative Plan: Extubation in OR  Informed Consent: I have reviewed the patients History and Physical, chart, labs and discussed the procedure including the risks, benefits and alternatives for the proposed anesthesia with the patient or authorized representative who has indicated his/her understanding and acceptance.     Dental advisory  given  Plan Discussed with: CRNA and Anesthesiologist  Anesthesia Plan Comments: (PAT note written 02/24/2020 by Myra Gianotti, PA-C. K 6.2 this am. Will cancel case. She needs dialysis and K to be normalized prior to elective procedure.  1456- Patient went for hemodialysis and repeat K post dialysis was 3.0  Will proceed with planned procedure.)    Anesthesia Quick Evaluation

## 2020-02-24 NOTE — Telephone Encounter (Signed)
ATC patient, she did not answer and her VM is full. Will attempt to call her again.

## 2020-02-24 NOTE — Telephone Encounter (Signed)
I called to check on Mr. Knack today.  She is walking out the door to HD.  Will catch up with her at another time.

## 2020-02-24 NOTE — Telephone Encounter (Signed)
Natasha [name] called to make sure someone was reaching out to pt regarding taking her Eliquis. She is scheduled for a procedure with Dr. Valeta Harms and needs to know when to quit etc. Please advise pt - Natasha [name] states she does not need a call back as long as we reach out to patient

## 2020-02-24 NOTE — Telephone Encounter (Signed)
Called let patient know to hold Eliquis starting tomorrow and not to take it again until they instruct her after the procedure. She voiced understanding. And verified her procedure on the 5th of October  Nothing further needed at this time.

## 2020-02-24 NOTE — Telephone Encounter (Signed)
-----   Message from Garner Nash, DO sent at 02/23/2020  7:20 PM EDT ----- Regarding: RE: Eliquis  Please reference note by Dr. Tamala Julian 3 days ago documenting that he instructed the patient to stop on Eliquis on October 2nd.   Please call patient and inform her again.   Thanks,  Brad ----- Message ----- From: Danna Hefty, RN Sent: 02/23/2020   7:04 PM EDT To: Garner Nash, DO Subject: Eliquis                                        Dr. Valeta Harms, Mrs Wandell said that no one has instructed her to hold Eliquis For procedure on 10/5. I instructed patient to call the office if she has not heard anything by Friday afternoon.  Thanks, Jan, RN

## 2020-02-24 NOTE — Telephone Encounter (Signed)
Okay to send order for humidifier to use with supplemental oxygen.  Order for shower chair should come through her PCP.

## 2020-02-24 NOTE — Progress Notes (Signed)
Anesthesia Chart Review: SAME DAY WORK-UP   Case: [id number] Date/Time: 02/28/20 1000   Procedure: VIDEO BRONCHOSCOPY WITH ENDOBRONCHIAL NAVIGATION (N/A )   Anesthesia type: General   Diagnosis: Lung mass [R91.8]   Pre-op diagnosis: Lung mass   Location: [facility] / [facility]   Surgeons: Garner Nash, DO      DISCUSSION: Patient is a 74 year old female scheduled for the above procedure. 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 Thoracic lesion biopsy was inclusive. Above procedure recommended in hopes of definitive diagnosis to guide treatment regimen.  Other history includes former smoker (quit 11/27/17), COPD, HTN, ESRD (left axillobasilic AVF 0865; HD MWF), chronic diastolic CHF, PE (LUL PE 11/30/44), DVT (LLE acute 11/20215, chronic LLE DVT on 05/29/18 [place]), polyarthralgia, Crohn's disease (diagnosed 1978, s/p surgery x3 with ileocolonic anastomosis; perianal fistula 2016), glaucoma, post-surgical hypothyroidism, mercaptopurine induced pancreatitis (2008), anemia, exertional dyspnea, aortic insufficiency (moderate AI 08/07/16, but no AI seen 10/29/18 echo), PAF/SVT (10/18/19, s/p adenosine; 11/18/19), seizures (03/2017), COVID 04/11/19.  - Admission 02/15/20-02/21/20 for headaches with imaging concerning for brain mets. Oncology and pulmonary consulted.  Thought to most likely have a primary left lung cancer (noted on 10/18/19 CT and had been undergoing work-up) with out-patient bronchoscopy recommended. She was started on decadron for brain lesion with mild vasogenic edema. Nephrology managed dialysis.   - Admission 01/30/20-02/03/20 with acute on chronic anemia (HGB 6.8). Occult blood negative. S/p EGD and colonoscopy S/p 1 unit PRBC, Feraheme. Treated for chronic gastritis with H. Pylori.    Last evaluation with cardiology was in August. She was maintaining SR on 02/15/20 EKG. She uses continuous 2-3L home O2. PAT phone RN sent message to Dr. Valeta Harms and also asked  patient for follow-up regarding perioperative Eliquis instructions--I also called his office and asked staff to follow-up with patient. She is a same day work-up, so further evaluation by her anesthesia team on the day of surgery. She is a dialysis patient, if COVID-19 test not done then will need day of surgery. Last H/H 8.7/28.0 on 02/21/20, up from 8.1/26.2 on 02/20/20.    VS:  BP Readings from Last 3 Encounters:  02/23/20 (!) 122/58  02/21/20 (!) 102/54  02/03/20 131/69   Pulse Readings from Last 3 Encounters:  02/23/20 94  02/21/20 69  02/03/20 88    PROVIDERS: [name], MD - Chesley Mires, MD is pulmonologist - Curt Bears, MD is HEM-ONC. 10/18/19 CT chest showed LUL suspicious for primary carcinoima. She also had a hypermetabolic lesion in the inferior endplate of N62, however 11/29/19 biopsy was not conclusive. Brain MRI was initially negative. Patient had opted not to pursue bronchoscopy, but presented with headaches 02/15/20 with imaging showing interval enlargement of LUL lung mass and brain lesions suspicious for mets.  Patient now agreeable to proceed with bronchoscopy to obtain biopsy.  She was referred to radiation oncology regarding timing of brain radiation. - Tyler Pita, MD is RAD-ONC - Wilford Corner, MD is GI  - Mertie Moores, MD is cardiologist. Last evaluation 12/27/19.  She was waiting to get bronchoscopy scheduled at that time.  52-monthfollow-up planned.   LABS: For day of procedure. Currently, last labs include: Lab Results  Component Value Date   WBC 7.1 02/21/2020   HGB 8.7 (L) 02/21/2020   HCT 28.0 (L) 02/21/2020   PLT 147 (L) 02/21/2020   GLUCOSE 101 (H) 02/21/2020   ALT 16 02/16/2020   AST 20 02/16/2020  NA 136 02/21/2020   K 4.3 02/21/2020   CL 96 (L) 02/21/2020   CREATININE 6.16 (H) 02/21/2020   BUN 49 (H) 02/21/2020   CO2 26 02/21/2020   TSH 1.499 11/18/2019   INR 1.1 02/15/2020   HGBA1C 5.0 11/18/2019      IMAGES: MRI Brain 02/20/20: IMPRESSION: Numerous infratentorial and supratentorial enhancing metastatic lesions, as detailed above. Mild associated vasogenic edema without substantial mass effect.  CT Super D Chest 02/17/20: IMPRESSION: 1. Interval enlargement of the left upper lobe lung mass, now measuring 3.3 x 3.0 cm. 2. Stable 6 mm left upper lobe pulmonary nodules. 3. Stable severe emphysematous changes and pulmonary scarring. 4. Enlarged pulmonary artery suggesting pulmonary hypertension. 5. Stable atherosclerotic calcifications involving the thoracic aorta and branch vessels. 6. Emphysema and aortic atherosclerosis.   EKG: 02/15/20: Normal sinus rhythm Cannot rule out Anterior infarct , age undetermined Abnormal ECG No old tracing to compare Confirmed by Dorie Rank [phone]) on 02/15/2020 11:46:00 AM    CV:  Echo 10/29/18: IMPRESSIONS   1. The left ventricle has hyperdynamic systolic function, with an  ejection fraction of >65%. The cavity size was normal. Left ventricular  diastolic Doppler parameters are consistent with impaired relaxation.  2. The right ventricle has normal systolic function. The cavity was  normal. There is no increase in right ventricular wall thickness. Right  ventricular systolic pressure is mildly elevated with an estimated  pressure of 44.3 mmHg.  3. The aortic valve is tricuspid. Mild thickening of the aortic valve.  Moderate calcification of the aortic valve. Aortic valve  regurgitation was not visualized by color flow Doppler. There is no  evidence of aortic valve stenosis.    Past Medical History:  Diagnosis Date  . Anal fistula   . Anemia in chronic kidney disease (CKD)   . Anxiety   . Aortic insufficiency    Echo 3/18: mod conc LVH, EF 60-65, no RWMA, Gr 1 DD, mod AI, mild MR, normal RVSF, Trivial TR  . Arthritis   . Borderline hypertension   . Bulging disc    L3-L4  . Cancer ([place])    Brain and spinal cord mets.  . CHF  (congestive heart failure) ([place])   . Chronic diarrhea    due to crohn's  . CKD (chronic kidney disease), stage IV (HCC)    MWF- [facility]  . Crohn's disease ([place])    chronic ileitis  . Dyspnea    with exertion  . Emphysema/COPD ([place])   . GERD (gastroesophageal reflux disease)    denies  . History of blood transfusion   . History of glaucoma   . History of kidney stones   . History of pancreatitis    2008--  mercaptopurine  . History of small bowel obstruction    12-03-2010  due to crohn's ileitis  . Hypertension    no longer on medications  . Hypothyroidism, postsurgical    multinodule w/ hurthle cells  . Osteoporosis   . PAF (paroxysmal atrial fibrillation) ([place])   . Perianal Crohn's disease ([place])   . Peripheral vascular disease ([place])    blood clot behind knee left leg  . Polyarthralgia   . Pulmonary embolism ([place])   . Seizures ([place])    03/2017  . Wears partial dentures    upper    Past Surgical History:  Procedure Laterality Date  . ABDOMINAL HYSTERECTOMY  1990   and  Appendectomy  . AV FISTULA PLACEMENT Left 12/08/2017   Procedure: INSERTION OF  ARTERIOVENOUS (AV) GRAFT WITH ARTEGRAFT TO LEFT UPPER ARM;  Surgeon: Conrad Hardesty, MD;  Location: [place];  Service: Vascular;  Laterality: Left;  . BASCILIC VEIN TRANSPOSITION Left 01/09/2017   Procedure: LEFT 1ST STAGE BRACHIAL VEIN TRANSPOSITION;  Surgeon: Conrad Polson, MD;  Location: [place];  Service: Vascular;  Laterality: Left;  . BASCILIC VEIN TRANSPOSITION Left 06/16/2017   Procedure: Second Stage BASILIC VEIN TRANSPOSITION  LEFT ARM;  Surgeon: Conrad Marks, MD;  Location: [place];  Service: Vascular;  Laterality: Left;  . BIOPSY  03/09/2018   Procedure: BIOPSY;  Surgeon: Wilford Corner, MD;  Location: [facility];  Service: Endoscopy;;  . BIOPSY  01/31/2020   Procedure: BIOPSY;  Surgeon: Otis Brace, MD;  Location: [place];  Service: Gastroenterology;;  . BIOPSY  02/02/2020   Procedure: BIOPSY;   Surgeon: Otis Brace, MD;  Location: [place];  Service: Gastroenterology;;  . CHOLECYSTECTOMY    . COLON RESECTION  x3 --  1978,  1987, 1989   ILEAL RESECTION x2/   [place]  . COLONOSCOPY WITH PROPOFOL N/A 09/25/2014   Procedure: COLONOSCOPY WITH PROPOFOL;  Surgeon: Garlan Fair, MD;  [facility];  Service: Endoscopy;  Laterality: N/A;  . COLONOSCOPY WITH PROPOFOL N/A 03/09/2018   Procedure: COLONOSCOPY WITH PROPOFOL;  Surgeon: Wilford Corner, MD;  [facility];  Service: Endoscopy;  Laterality: N/A;  . COLONOSCOPY WITH PROPOFOL N/A 02/02/2020   Procedure: COLONOSCOPY WITH PROPOFOL;  Surgeon: Otis Brace, MD;  Location: [place];  Service: Gastroenterology;  Laterality: N/A;  . CYSTOSCOPY W/ URETERAL STENT PLACEMENT Right 11/19/2016   Procedure: CYSTOSCOPY WITH RIGHT RETROGRADE PYELOGRAM/ URETEROSCOPY WITH LASER AND RIGHT URETERAL STENT PLACEMENT;  Surgeon: Ardis Hughs, MD;  [facility];  Service: Urology;  Laterality: Right;  . ESOPHAGOGASTRODUODENOSCOPY  03/04/2012   Procedure: ESOPHAGOGASTRODUODENOSCOPY (EGD);  Surgeon: Garlan Fair, MD;  Location: [facility];  Service: Endoscopy;  Laterality: N/A;  . ESOPHAGOGASTRODUODENOSCOPY (EGD) WITH PROPOFOL N/A 01/31/2020   Procedure: ESOPHAGOGASTRODUODENOSCOPY (EGD) WITH PROPOFOL;  Surgeon: Otis Brace, MD;  [facility];  Service: Gastroenterology;  Laterality: N/A;  . EXAMINATION UNDER ANESTHESIA N/A 09/12/2014   Procedure: EXAM UNDER ANESTHESIA;  Surgeon: Rolm Bookbinder, MD;  Location: [place];  Service: General;  Laterality: N/A;  . EYE SURGERY     laser  . FLEXIBLE SIGMOIDOSCOPY  03/04/2012   Procedure: FLEXIBLE SIGMOIDOSCOPY;  Surgeon: Garlan Fair, MD;  [facility];  Service: Endoscopy;  Laterality: N/A;  . GLAUCOMA SURGERY Bilateral   . HOLMIUM LASER APPLICATION Right 11/12/3557   Procedure: HOLMIUM LASER APPLICATION;  Surgeon: Ardis Hughs, MD;  [facility];  Service: Urology;  Laterality: Right;  . INCISION AND DRAINAGE PERIRECTAL ABSCESS N/A 09/12/2014   Procedure: IRRIGATION AND DEBRIDEMENT PERIRECTAL ABSCESS;  Surgeon: Rolm Bookbinder, MD;  Location: [facility];  Service: General;  Laterality: N/A;  . IR FLUORO GUIDE CV LINE RIGHT  03/20/2017  . IR US GUIDE VASC ACCESS RIGHT  03/20/2017  . NEGATIVE SLEEP STUDY  2008  . PLACEMENT OF SETON N/A 12/01/2014   Procedure: PLACEMENT OF SETON;  Surgeon: [name], MD;  Location: [facility];  Service: General;  Laterality: N/A;  . POLYPECTOMY  03/09/2018   Procedure: POLYPECTOMY;  Surgeon: Wilford Corner, MD;  [facility];  Service: Endoscopy;;  . POLYPECTOMY  02/02/2020   Procedure: POLYPECTOMY;  Surgeon: Otis Brace, MD;  [facility];  Service: Gastroenterology;;  . RECTAL EXAM UNDER  ANESTHESIA N/A 12/01/2014   Procedure: RECTAL EXAM UNDER ANESTHESIA;  Surgeon: [name], MD;  Location: [facility];  Service: General;  Laterality: N/A;  . TOTAL THYROIDECTOMY  10-13-2003  . TRANSTHORACIC ECHOCARDIOGRAM  07-11-2013   mild LVH,  ef 55%,  moderate AR,  mild MR and TR,  trivial PR  . TUBAL LIGATION      MEDICATIONS: . 0.9 %  sodium chloride infusion  . 0.9 %  sodium chloride infusion  . alteplase (CATHFLO ACTIVASE) injection 2 mg  . heparin injection 1,000 Units  . heparin injection 1,000 Units  . lidocaine (PF) (XYLOCAINE) 1 % injection 5 mL  . lidocaine-prilocaine (EMLA) cream 1 application  . pentafluoroprop-tetrafluoroeth (GEBAUERS) aerosol 1 application   . acetaminophen (TYLENOL) 500 MG tablet  . Adalimumab (HUMIRA) 40 MG/0.8ML PSKT  . albuterol (VENTOLIN HFA) 108 (90 Base) MCG/ACT inhaler  . calcitRIOL (ROCALTROL) 0.25 MCG capsule  . calcium acetate (PHOSLO) 667 MG capsule  . calcium-vitamin D (OSCAL WITH D) 500-200 MG-UNIT TABS tablet  . cyanocobalamin (,VITAMIN B-12,) 1000 MCG/ML injection   . dexamethasone (DECADRON) 4 MG tablet  . diltiazem (CARDIZEM) 30 MG tablet  . ELIQUIS 5 MG TABS tablet  . fluticasone (FLONASE) 50 MCG/ACT nasal spray  . Fluticasone-Umeclidin-Vilant (TRELEGY ELLIPTA) 100-62.5-25 MCG/INH AEPB  . ipratropium-albuterol (DUONEB) 0.5-2.5 (3) MG/3ML SOLN  . levothyroxine (SYNTHROID, LEVOTHROID) 125 MCG tablet  . lidocaine-prilocaine (EMLA) cream  . LORazepam (ATIVAN) 0.5 MG tablet  . metoprolol tartrate (LOPRESSOR) 25 MG tablet  . midodrine (PROAMATINE) 10 MG tablet  . nitroGLYCERIN (NITROSTAT) 0.4 MG SL tablet  . ondansetron (ZOFRAN ODT) 4 MG disintegrating tablet  . oxyCODONE (OXY IR/ROXICODONE) 5 MG immediate release tablet  . OXYGEN  . pantoprazole (PROTONIX) 40 MG tablet  . Respiratory Therapy Supplies (NEBULIZER) DEVI  . sorbitol 70 % SOLN  . sorbitol 70 % solution     Myra Gianotti, PA-C Surgical Short Stay/Anesthesiology [facility] Phone [phone] [facility] Phone [phone] 02/24/2020 12:43 PM

## 2020-02-25 ENCOUNTER — Other Ambulatory Visit (HOSPITAL_COMMUNITY)
Admission: RE | Admit: 2020-02-25 | Discharge: 2020-02-25 | Disposition: A | Discharge status: Home / Self Care | Payer: Medicare Other | Source: Ambulatory Visit | Attending: Pulmonary Disease | Admitting: Pulmonary Disease

## 2020-02-25 DIAGNOSIS — Z01812 Encounter for preprocedural laboratory examination: Secondary | ICD-10-CM | POA: Diagnosis present

## 2020-02-25 DIAGNOSIS — Z20822 Contact with and (suspected) exposure to covid-19: Secondary | ICD-10-CM | POA: Insufficient documentation

## 2020-02-25 LAB — SARS CORONAVIRUS 2 (TAT 6-24 HRS): SARS Coronavirus 2: NEGATIVE

## 2020-02-27 ENCOUNTER — Ambulatory Visit (HOSPITAL_COMMUNITY): Admission: RE | Admit: 2020-02-27 | Payer: Medicare Other | Source: Ambulatory Visit

## 2020-02-28 ENCOUNTER — Ambulatory Visit (HOSPITAL_COMMUNITY): Payer: Medicare Other

## 2020-02-28 ENCOUNTER — Other Ambulatory Visit: Payer: Self-pay

## 2020-02-28 ENCOUNTER — Encounter (HOSPITAL_COMMUNITY): Payer: Self-pay | Admitting: Pulmonary Disease

## 2020-02-28 ENCOUNTER — Ambulatory Visit (HOSPITAL_COMMUNITY): Payer: Medicare Other | Admitting: Vascular Surgery

## 2020-02-28 ENCOUNTER — Ambulatory Visit (HOSPITAL_BASED_OUTPATIENT_CLINIC_OR_DEPARTMENT_OTHER)
Admission: RE | Admit: 2020-02-28 | Discharge: 2020-02-28 | Disposition: A | Discharge status: Home / Self Care | Payer: Medicare Other | Source: Home / Self Care | Attending: Pulmonary Disease | Admitting: Pulmonary Disease

## 2020-02-28 ENCOUNTER — Encounter (HOSPITAL_COMMUNITY)
Admission: RE | Disposition: A | Discharge status: Home / Self Care | Payer: Self-pay | Source: Home / Self Care | Attending: Pulmonary Disease

## 2020-02-28 DIAGNOSIS — E890 Postprocedural hypothyroidism: Secondary | ICD-10-CM | POA: Insufficient documentation

## 2020-02-28 DIAGNOSIS — C7931 Secondary malignant neoplasm of brain: Secondary | ICD-10-CM | POA: Insufficient documentation

## 2020-02-28 DIAGNOSIS — Z9049 Acquired absence of other specified parts of digestive tract: Secondary | ICD-10-CM | POA: Insufficient documentation

## 2020-02-28 DIAGNOSIS — M2550 Pain in unspecified joint: Secondary | ICD-10-CM | POA: Insufficient documentation

## 2020-02-28 DIAGNOSIS — R918 Other nonspecific abnormal finding of lung field: Secondary | ICD-10-CM | POA: Diagnosis not present

## 2020-02-28 DIAGNOSIS — Z86711 Personal history of pulmonary embolism: Secondary | ICD-10-CM | POA: Insufficient documentation

## 2020-02-28 DIAGNOSIS — F419 Anxiety disorder, unspecified: Secondary | ICD-10-CM | POA: Insufficient documentation

## 2020-02-28 DIAGNOSIS — J449 Chronic obstructive pulmonary disease, unspecified: Secondary | ICD-10-CM | POA: Diagnosis not present

## 2020-02-28 DIAGNOSIS — D631 Anemia in chronic kidney disease: Secondary | ICD-10-CM | POA: Insufficient documentation

## 2020-02-28 DIAGNOSIS — I509 Heart failure, unspecified: Secondary | ICD-10-CM | POA: Insufficient documentation

## 2020-02-28 DIAGNOSIS — Z825 Family history of asthma and other chronic lower respiratory diseases: Secondary | ICD-10-CM | POA: Insufficient documentation

## 2020-02-28 DIAGNOSIS — Z9889 Other specified postprocedural states: Secondary | ICD-10-CM

## 2020-02-28 DIAGNOSIS — M810 Age-related osteoporosis without current pathological fracture: Secondary | ICD-10-CM | POA: Insufficient documentation

## 2020-02-28 DIAGNOSIS — Z90710 Acquired absence of both cervix and uterus: Secondary | ICD-10-CM | POA: Insufficient documentation

## 2020-02-28 DIAGNOSIS — M1990 Unspecified osteoarthritis, unspecified site: Secondary | ICD-10-CM | POA: Insufficient documentation

## 2020-02-28 DIAGNOSIS — K219 Gastro-esophageal reflux disease without esophagitis: Secondary | ICD-10-CM | POA: Insufficient documentation

## 2020-02-28 DIAGNOSIS — Z79899 Other long term (current) drug therapy: Secondary | ICD-10-CM | POA: Insufficient documentation

## 2020-02-28 DIAGNOSIS — Z8249 Family history of ischemic heart disease and other diseases of the circulatory system: Secondary | ICD-10-CM | POA: Insufficient documentation

## 2020-02-28 DIAGNOSIS — Z7952 Long term (current) use of systemic steroids: Secondary | ICD-10-CM | POA: Insufficient documentation

## 2020-02-28 DIAGNOSIS — J439 Emphysema, unspecified: Secondary | ICD-10-CM | POA: Insufficient documentation

## 2020-02-28 DIAGNOSIS — I130 Hypertensive heart and chronic kidney disease with heart failure and stage 1 through stage 4 chronic kidney disease, or unspecified chronic kidney disease: Secondary | ICD-10-CM | POA: Insufficient documentation

## 2020-02-28 DIAGNOSIS — Z87891 Personal history of nicotine dependence: Secondary | ICD-10-CM | POA: Insufficient documentation

## 2020-02-28 DIAGNOSIS — J9600 Acute respiratory failure, unspecified whether with hypoxia or hypercapnia: Secondary | ICD-10-CM | POA: Diagnosis not present

## 2020-02-28 DIAGNOSIS — I739 Peripheral vascular disease, unspecified: Secondary | ICD-10-CM | POA: Insufficient documentation

## 2020-02-28 DIAGNOSIS — R222 Localized swelling, mass and lump, trunk: Secondary | ICD-10-CM | POA: Insufficient documentation

## 2020-02-28 DIAGNOSIS — Z8349 Family history of other endocrine, nutritional and metabolic diseases: Secondary | ICD-10-CM | POA: Insufficient documentation

## 2020-02-28 DIAGNOSIS — C3412 Malignant neoplasm of upper lobe, left bronchus or lung: Secondary | ICD-10-CM | POA: Insufficient documentation

## 2020-02-28 DIAGNOSIS — I700 Atherosclerosis of aorta: Secondary | ICD-10-CM | POA: Insufficient documentation

## 2020-02-28 DIAGNOSIS — Z87442 Personal history of urinary calculi: Secondary | ICD-10-CM | POA: Insufficient documentation

## 2020-02-28 DIAGNOSIS — Z7901 Long term (current) use of anticoagulants: Secondary | ICD-10-CM | POA: Insufficient documentation

## 2020-02-28 DIAGNOSIS — C3482 Malignant neoplasm of overlapping sites of left bronchus and lung: Secondary | ICD-10-CM | POA: Diagnosis not present

## 2020-02-28 DIAGNOSIS — K5090 Crohn's disease, unspecified, without complications: Secondary | ICD-10-CM | POA: Insufficient documentation

## 2020-02-28 DIAGNOSIS — N184 Chronic kidney disease, stage 4 (severe): Secondary | ICD-10-CM | POA: Insufficient documentation

## 2020-02-28 DIAGNOSIS — H409 Unspecified glaucoma: Secondary | ICD-10-CM | POA: Insufficient documentation

## 2020-02-28 DIAGNOSIS — Z992 Dependence on renal dialysis: Secondary | ICD-10-CM | POA: Insufficient documentation

## 2020-02-28 DIAGNOSIS — G939 Disorder of brain, unspecified: Secondary | ICD-10-CM

## 2020-02-28 DIAGNOSIS — I480 Paroxysmal atrial fibrillation: Secondary | ICD-10-CM | POA: Insufficient documentation

## 2020-02-28 HISTORY — DX: Anxiety disorder, unspecified: F41.9

## 2020-02-28 HISTORY — PX: BRONCHIAL BRUSHINGS: SHX5108

## 2020-02-28 HISTORY — DX: Malignant (primary) neoplasm, unspecified: C80.1

## 2020-02-28 HISTORY — DX: Other pulmonary embolism without acute cor pulmonale: I26.99

## 2020-02-28 HISTORY — PX: BRONCHIAL BIOPSY: SHX5109

## 2020-02-28 HISTORY — PX: BRONCHIAL WASHINGS: SHX5105

## 2020-02-28 HISTORY — PX: BRONCHIAL NEEDLE ASPIRATION BIOPSY: SHX5106

## 2020-02-28 HISTORY — DX: Heart failure, unspecified: I50.9

## 2020-02-28 HISTORY — PX: VIDEO BRONCHOSCOPY WITH ENDOBRONCHIAL NAVIGATION: SHX6175

## 2020-02-28 LAB — COMPREHENSIVE METABOLIC PANEL
ALT: 17 U/L (ref 0–44)
AST: 17 U/L (ref 15–41)
Albumin: 3.1 g/dL — ABNORMAL LOW (ref 3.5–5.0)
Alkaline Phosphatase: 51 U/L (ref 38–126)
Anion gap: 19 — ABNORMAL HIGH (ref 5–15)
BUN: 135 mg/dL — ABNORMAL HIGH (ref 8–23)
CO2: 18 mmol/L — ABNORMAL LOW (ref 22–32)
Calcium: 9.1 mg/dL (ref 8.9–10.3)
Chloride: 103 mmol/L (ref 98–111)
Creatinine, Ser: 11.08 mg/dL — ABNORMAL HIGH (ref 0.44–1.00)
GFR calc non Af Amer: 3 mL/min — ABNORMAL LOW (ref >60)
Glucose, Bld: 91 mg/dL (ref 70–99)
Potassium: 6.2 mmol/L — ABNORMAL HIGH (ref 3.5–5.1)
Sodium: 140 mmol/L (ref 135–145)
Total Bilirubin: 0.5 mg/dL (ref 0.3–1.2)
Total Protein: 5.8 g/dL — ABNORMAL LOW (ref 6.5–8.1)

## 2020-02-28 LAB — POTASSIUM: Potassium: 3 mmol/L — ABNORMAL LOW (ref 3.5–5.1)

## 2020-02-28 LAB — PROTIME-INR
INR: 1.1 (ref 0.8–1.2)
Prothrombin Time: 13.9 seconds (ref 11.4–15.2)

## 2020-02-28 LAB — CBC
HCT: 25.1 % — ABNORMAL LOW (ref 36.0–46.0)
Hemoglobin: 7.5 g/dL — ABNORMAL LOW (ref 12.0–15.0)
MCH: 30 pg (ref 26.0–34.0)
MCHC: 29.9 g/dL — ABNORMAL LOW (ref 30.0–36.0)
MCV: 100.4 fL — ABNORMAL HIGH (ref 80.0–100.0)
Platelets: 169 10*3/uL (ref 150–400)
RBC: 2.5 MIL/uL — ABNORMAL LOW (ref 3.87–5.11)
RDW: 19 % — ABNORMAL HIGH (ref 11.5–15.5)
WBC: 14.9 10*3/uL — ABNORMAL HIGH (ref 4.0–10.5)
nRBC: 0 % (ref 0.0–0.2)

## 2020-02-28 LAB — APTT: aPTT: 24 seconds (ref 24–36)

## 2020-02-28 SURGERY — VIDEO BRONCHOSCOPY WITH ENDOBRONCHIAL NAVIGATION
Anesthesia: General

## 2020-02-28 MED ORDER — ONDANSETRON HCL 4 MG/2ML IJ SOLN
INTRAMUSCULAR | Status: DC | PRN
  Administered 2020-02-28: 4 mg via INTRAVENOUS

## 2020-02-28 MED ORDER — SODIUM CHLORIDE 0.9 % IV SOLN: 100.0000 mL | INTRAVENOUS | Status: DC | PRN

## 2020-02-28 MED ORDER — SODIUM CHLORIDE 0.9 % IV SOLN: INTRAVENOUS | Status: DC

## 2020-02-28 MED ORDER — HEPARIN SODIUM (PORCINE) 1000 UNIT/ML DIALYSIS
1000.0000 [IU] | INTRAMUSCULAR | Status: DC | PRN
  Filled 2020-02-28: qty 1

## 2020-02-28 MED ORDER — METOPROLOL TARTRATE 12.5 MG HALF TABLET
ORAL_TABLET | ORAL | Status: AC
  Administered 2020-02-28: 25 mg via ORAL
  Filled 2020-02-28: qty 2

## 2020-02-28 MED ORDER — CHLORHEXIDINE GLUCONATE 0.12 % MT SOLN
15.0000 mL | Freq: Once | OROMUCOSAL | Status: AC
  Filled 2020-02-28: qty 15

## 2020-02-28 MED ORDER — LIDOCAINE HCL (PF) 1 % IJ SOLN: 5.0000 mL | INTRAMUSCULAR | Status: DC | PRN

## 2020-02-28 MED ORDER — CHLORHEXIDINE GLUCONATE CLOTH 2 % EX PADS: 6.0000 | MEDICATED_PAD | Freq: Every day | CUTANEOUS | Status: DC

## 2020-02-28 MED ORDER — ROCURONIUM BROMIDE 10 MG/ML (PF) SYRINGE
PREFILLED_SYRINGE | INTRAVENOUS | Status: DC | PRN
  Administered 2020-02-28: 30 mg via INTRAVENOUS

## 2020-02-28 MED ORDER — LIDOCAINE 2% (20 MG/ML) 5 ML SYRINGE
INTRAMUSCULAR | Status: DC | PRN
  Administered 2020-02-28: 50 mg via INTRAVENOUS

## 2020-02-28 MED ORDER — SUGAMMADEX SODIUM 200 MG/2ML IV SOLN
INTRAVENOUS | Status: DC | PRN
  Administered 2020-02-28: 110 mg via INTRAVENOUS

## 2020-02-28 MED ORDER — ALTEPLASE 2 MG IJ SOLR
2.0000 mg | Freq: Once | INTRAMUSCULAR | Status: DC | PRN
  Filled 2020-02-28: qty 2

## 2020-02-28 MED ORDER — METOPROLOL TARTRATE 25 MG PO TABS: 25.0000 mg | ORAL_TABLET | Freq: Once | ORAL | Status: AC

## 2020-02-28 MED ORDER — PROPOFOL 10 MG/ML IV BOLUS
INTRAVENOUS | Status: DC | PRN
  Administered 2020-02-28: 100 mg via INTRAVENOUS

## 2020-02-28 MED ORDER — PENTAFLUOROPROP-TETRAFLUOROETH EX AERO: 1.0000 "application " | INHALATION_SPRAY | CUTANEOUS | Status: DC | PRN

## 2020-02-28 MED ORDER — PHENYLEPHRINE 40 MCG/ML (10ML) SYRINGE FOR IV PUSH (FOR BLOOD PRESSURE SUPPORT)
PREFILLED_SYRINGE | INTRAVENOUS | Status: DC | PRN
  Administered 2020-02-28 (×2): 80 ug via INTRAVENOUS

## 2020-02-28 MED ORDER — PHENYLEPHRINE HCL-NACL 10-0.9 MG/250ML-% IV SOLN
INTRAVENOUS | Status: DC | PRN
  Administered 2020-02-28: 50 ug/min via INTRAVENOUS

## 2020-02-28 MED ORDER — CHLORHEXIDINE GLUCONATE 0.12 % MT SOLN
OROMUCOSAL | Status: AC
  Administered 2020-02-28: 15 mL via OROMUCOSAL
  Filled 2020-02-28: qty 15

## 2020-02-28 MED ORDER — SODIUM ZIRCONIUM CYCLOSILICATE 10 G PO PACK
10.0000 g | PACK | Freq: Once | ORAL | Status: DC
  Filled 2020-02-28 (×2): qty 1

## 2020-02-28 MED ORDER — DEXAMETHASONE SODIUM PHOSPHATE 10 MG/ML IJ SOLN
INTRAMUSCULAR | Status: DC | PRN
  Administered 2020-02-28: 4 mg via INTRAVENOUS

## 2020-02-28 MED ORDER — LIDOCAINE-PRILOCAINE 2.5-2.5 % EX CREA
1.0000 "application " | TOPICAL_CREAM | CUTANEOUS | Status: DC | PRN
  Filled 2020-02-28: qty 5

## 2020-02-28 SURGICAL SUPPLY — 47 items
ADAPTER BRONCH F/PENTAX (ADAPTER) ×4 IMPLANT
ADAPTER VALVE BIOPSY EBUS (MISCELLANEOUS) IMPLANT
ADPR BSCP EDG PNTX (ADAPTER) ×2
ADPTR VALVE BIOPSY EBUS (MISCELLANEOUS)
BRUSH CYTOL CELLEBRITY 1.5X140 (MISCELLANEOUS) ×4 IMPLANT
BRUSH SUPERTRAX BIOPSY (INSTRUMENTS) IMPLANT
BRUSH SUPERTRAX NDL-TIP CYTO (INSTRUMENTS) ×4 IMPLANT
CANISTER SUCT 3000ML PPV (MISCELLANEOUS) ×4 IMPLANT
CHANNEL WORK EXTEND EDGE 180 (KITS) IMPLANT
CHANNEL WORK EXTEND EDGE 45 (KITS) IMPLANT
CHANNEL WORK EXTEND EDGE 90 (KITS) IMPLANT
CONT SPEC 4OZ CLIKSEAL STRL BL (MISCELLANEOUS) ×4 IMPLANT
COVER BACK TABLE 60X90IN (DRAPES) ×4 IMPLANT
FILTER STRAW FLUID ASPIR (MISCELLANEOUS) IMPLANT
FORCEPS BIOP SUPERTRX PREMAR (INSTRUMENTS) ×4 IMPLANT
GAUZE SPONGE 4X4 12PLY STRL (GAUZE/BANDAGES/DRESSINGS) ×4 IMPLANT
GLOVE SURG SS PI 7.5 STRL IVOR (GLOVE) ×8 IMPLANT
GOWN STRL REUS W/ TWL LRG LVL3 (GOWN DISPOSABLE) ×4 IMPLANT
GOWN STRL REUS W/TWL LRG LVL3 (GOWN DISPOSABLE) ×8
KIT CLEAN ENDO COMPLIANCE (KITS) ×4 IMPLANT
KIT LOCATABLE GUIDE (CANNULA) IMPLANT
KIT MARKER FIDUCIAL DELIVERY (KITS) IMPLANT
KIT PROCEDURE EDGE 180 (KITS) IMPLANT
KIT PROCEDURE EDGE 45 (KITS) IMPLANT
KIT PROCEDURE EDGE 90 (KITS) IMPLANT
KIT TURNOVER KIT B (KITS) ×4 IMPLANT
MARKER SKIN DUAL TIP RULER LAB (MISCELLANEOUS) ×4 IMPLANT
NDL SUPERTRX PREMARK BIOPSY (NEEDLE) ×1 IMPLANT
NEEDLE SUPERTRX PREMARK BIOPSY (NEEDLE) ×4 IMPLANT
NS IRRIG 1000ML POUR BTL (IV SOLUTION) ×4 IMPLANT
OIL SILICONE PENTAX (PARTS (SERVICE/REPAIRS)) ×4 IMPLANT
PAD ARMBOARD 7.5X6 YLW CONV (MISCELLANEOUS) ×8 IMPLANT
PATCHES PATIENT (LABEL) ×12 IMPLANT
SOL ANTI FOG 6CC (MISCELLANEOUS) ×2 IMPLANT
SOLUTION ANTI FOG 6CC (MISCELLANEOUS) ×2
SYR 20CC LL (SYRINGE) ×4 IMPLANT
SYR 20ML ECCENTRIC (SYRINGE) ×4 IMPLANT
SYR 50ML SLIP (SYRINGE) ×4 IMPLANT
TOWEL OR 17X24 6PK STRL BLUE (TOWEL DISPOSABLE) ×4 IMPLANT
TRAP SPECIMEN MUCOUS 40CC (MISCELLANEOUS) IMPLANT
TUBE CONNECTING 20'X1/4 (TUBING) ×1
TUBE CONNECTING 20X1/4 (TUBING) ×3 IMPLANT
UNDERPAD 30X30 (UNDERPADS AND DIAPERS) ×4 IMPLANT
VALVE BIOPSY  SINGLE USE (MISCELLANEOUS) ×4
VALVE BIOPSY SINGLE USE (MISCELLANEOUS) ×2 IMPLANT
VALVE SUCTION BRONCHIO DISP (MISCELLANEOUS) ×4 IMPLANT
WATER STERILE IRR 1000ML POUR (IV SOLUTION) ×4 IMPLANT

## 2020-02-28 NOTE — Progress Notes (Signed)
PCCM:  Patient was sent to HD. Cases pushed to last case of the day. Nephrology recommends her getting dialysis tomorrow as well.  Per anesthesia now ok to proceed with scheduled bronchoscopy.   Garner Nash, DO [facility] 02/28/2020 3:13 PM

## 2020-02-28 NOTE — Progress Notes (Signed)
Potassium of 6.2 noted during handoff report, Dr. Royce Macadamia notified.  Per Dr. Valeta Harms, transport pt to HD.  Report called to HD, transported with all belongings.

## 2020-02-28 NOTE — Transfer of Care (Signed)
Immediate Anesthesia Transfer of Care Note  Patient: Natasha [name]  Procedure(s) Performed: VIDEO BRONCHOSCOPY WITH ENDOBRONCHIAL NAVIGATION (N/A ) BRONCHIAL BRUSHINGS BRONCHIAL NEEDLE ASPIRATION BIOPSIES BRONCHIAL BIOPSIES BRONCHIAL WASHINGS  Patient Location: PACU  Anesthesia Type:General  Level of Consciousness: drowsy and patient cooperative  Airway & Oxygen Therapy: Patient Spontanous Breathing and Patient connected to face mask oxygen  Post-op Assessment: Report given to RN and Post -op Vital signs reviewed and stable  Post vital signs: Reviewed and stable  Last Vitals:  Vitals Value Taken Time  BP 141/64 02/28/20 1615  Temp 36.9 C 02/28/20 1615  Pulse 84 02/28/20 1622  Resp 10 02/28/20 1622  SpO2 100 % 02/28/20 1622  Vitals shown include unvalidated device data.  Last Pain:  Vitals:   02/28/20 1615  TempSrc:   PainSc: 0-No pain         Complications: No complications documented.

## 2020-02-28 NOTE — Consult Note (Signed)
Synopsis: Referred in October 2021 for bronchoscopy evaluation, navigational bronchoscopy by Dr. Tamala Julian  Subjective:   PATIENT ID: Natasha [name] GENDER: female DOB: 07-14-1945, MRN: [id number]  This is a 74 year old female with a past medical history of seizure, PVD, PAF previously on Eliquis, congestive heart failure, hypertension, COPD, history of DVT, ESRD on IHD Monday Wednesday Friday.  She originally had a CT scan in June 2021 which revealed a left lower lobe mass.  She declined intervention or work-up at that time.  She had a repeat PET scan in June 2021 which showed a left-sided mass 2.3 x 1.9 cm with metastatic osseous lesions.  She went for a biopsy of the spine.  We try to get her in for bronchoscopy but declined.  She does have severe COPD at baseline with an FEV1 of 36% predicted.  The T10 lesion that was biopsied by our our revealed atypical lymphoid infiltrates but no carcinoma.  She declined bronchoscopy by Dr. Lamonte Sakai.  Patient presented back to the emergency department in September 2021 with neurologic changes.  She had imaging of the brain which revealed metastasis.  She would like work-up at this time for her procedure.  She was on Eliquis at the time also receiving dialysis and getting treatment with Decadron.  Therefore bronchoscopy was held until the following week.  She presents today to the hospital for navigational bronchoscopy with tissue diagnosis planned.   Past Medical History:  Diagnosis Date  . Anal fistula   . Anemia in chronic kidney disease (CKD)   . Anxiety   . Aortic insufficiency    Echo 3/18: mod conc LVH, EF 60-65, no RWMA, Gr 1 DD, mod AI, mild MR, normal RVSF, Trivial TR  . Arthritis   . Borderline hypertension   . Bulging disc    L3-L4  . Cancer ([place])    Brain and spinal cord mets.  . CHF (congestive heart failure) ([employer])   . Chronic diarrhea    due to crohn's  . CKD (chronic kidney disease), stage IV (HCC)    MWF- [facility] [place]  . Crohn's  disease ([place])    chronic ileitis  . Dyspnea    with exertion  . Emphysema/COPD ([place])   . GERD (gastroesophageal reflux disease)    denies  . History of blood transfusion   . History of glaucoma   . History of kidney stones   . History of pancreatitis    2008--  mercaptopurine  . History of small bowel obstruction    12-03-2010  due to crohn's ileitis  . Hypertension    no longer on medications  . Hypothyroidism, postsurgical    multinodule w/ hurthle cells  . Osteoporosis   . PAF (paroxysmal atrial fibrillation) ([place])   . Perianal Crohn's disease ([place])   . Peripheral vascular disease ([place])    blood clot behind knee left leg  . Polyarthralgia   . Pulmonary embolism ([place])   . Seizures ([place])    03/2017  . Wears partial dentures    upper     Family History  Problem Relation Age of Onset  . Hypertension Father   . Heart disease Father   . Heart attack Father   . Diverticulitis Mother   . COPD Mother   . Hypertension Mother   . Heart disease Mother   . Heart attack Mother   . Heart attack Brother   . Colon cancer Brother   . Diabetes Brother   . Thyroid disease Brother  Past Surgical History:  Procedure Laterality Date  . ABDOMINAL HYSTERECTOMY  1990   and  Appendectomy  . AV FISTULA PLACEMENT Left 12/08/2017   Procedure: INSERTION OF ARTERIOVENOUS (AV) GRAFT WITH ARTEGRAFT TO LEFT UPPER ARM;  Surgeon: Conrad Gary, MD;  Location: [facility];  Service: Vascular;  Laterality: Left;  . BASCILIC VEIN TRANSPOSITION Left 01/09/2017   Procedure: LEFT 1ST STAGE BRACHIAL VEIN TRANSPOSITION;  Surgeon: Conrad Badger, MD;  Location: [facility];  Service: Vascular;  Laterality: Left;  . BASCILIC VEIN TRANSPOSITION Left 06/16/2017   Procedure: Second Stage BASILIC VEIN TRANSPOSITION  LEFT ARM;  Surgeon: Conrad , MD;  Location: [facility];  Service: Vascular;  Laterality: Left;  . BIOPSY  03/09/2018   Procedure: BIOPSY;  Surgeon: Wilford Corner, MD;  [facility];   Service: Endoscopy;;  . BIOPSY  01/31/2020   Procedure: BIOPSY;  Surgeon: Otis Brace, MD;  Location: [facility];  Service: Gastroenterology;;  . BIOPSY  02/02/2020   Procedure: BIOPSY;  Surgeon: Otis Brace, MD;  Location: [facility];  Service: Gastroenterology;;  . CHOLECYSTECTOMY    . COLON RESECTION  x3 --  1978,  1987, 1989   ILEAL RESECTION x2/   [place]  . COLONOSCOPY WITH PROPOFOL N/A 09/25/2014   Procedure: COLONOSCOPY WITH PROPOFOL;  Surgeon: Garlan Fair, MD;  [facility];  Service: Endoscopy;  Laterality: N/A;  . COLONOSCOPY WITH PROPOFOL N/A 03/09/2018   Procedure: COLONOSCOPY WITH PROPOFOL;  Surgeon: Wilford Corner, MD;  [facility];  Service: Endoscopy;  Laterality: N/A;  . COLONOSCOPY WITH PROPOFOL N/A 02/02/2020   Procedure: COLONOSCOPY WITH PROPOFOL;  Surgeon: Otis Brace, MD;  Location: [facility];  Service: Gastroenterology;  Laterality: N/A;  . CYSTOSCOPY W/ URETERAL STENT PLACEMENT Right 11/19/2016   Procedure: CYSTOSCOPY WITH RIGHT RETROGRADE PYELOGRAM/ URETEROSCOPY WITH LASER AND RIGHT URETERAL STENT PLACEMENT;  Surgeon: Ardis Hughs, MD;  [facility];  Service: Urology;  Laterality: Right;  . ESOPHAGOGASTRODUODENOSCOPY  03/04/2012   Procedure: ESOPHAGOGASTRODUODENOSCOPY (EGD);  Surgeon: Garlan Fair, MD;  [facility];  Service: Endoscopy;  Laterality: N/A;  . ESOPHAGOGASTRODUODENOSCOPY (EGD) WITH PROPOFOL N/A 01/31/2020   Procedure: ESOPHAGOGASTRODUODENOSCOPY (EGD) WITH PROPOFOL;  Surgeon: Otis Brace, MD;  [facility];  Service: Gastroenterology;  Laterality: N/A;  . EXAMINATION UNDER ANESTHESIA N/A 09/12/2014   Procedure: EXAM UNDER ANESTHESIA;  Surgeon: Rolm Bookbinder, MD;  Location: [facility];  Service: General;  Laterality: N/A;  . EYE SURGERY     laser  . FLEXIBLE SIGMOIDOSCOPY  03/04/2012   Procedure: FLEXIBLE SIGMOIDOSCOPY;  Surgeon: Garlan Fair, MD;   [facility];  Service: Endoscopy;  Laterality: N/A;  . GLAUCOMA SURGERY Bilateral   . HOLMIUM LASER APPLICATION Right 7/61/9509   Procedure: HOLMIUM LASER APPLICATION;  Surgeon: Ardis Hughs, MD;  [facility];  Service: Urology;  Laterality: Right;  . INCISION AND DRAINAGE PERIRECTAL ABSCESS N/A 09/12/2014   Procedure: IRRIGATION AND DEBRIDEMENT PERIRECTAL ABSCESS;  Surgeon: Rolm Bookbinder, MD;  Location: [facility];  Service: General;  Laterality: N/A;  . IR FLUORO GUIDE CV LINE RIGHT  03/20/2017  . IR US GUIDE VASC ACCESS RIGHT  03/20/2017  . NEGATIVE SLEEP STUDY  2008  . PLACEMENT OF SETON N/A 12/01/2014   Procedure: PLACEMENT OF SETON;  Surgeon: [name], MD;  [facility];  Service: General;  Laterality: N/A;  . POLYPECTOMY  03/09/2018   Procedure: POLYPECTOMY;  Surgeon: Wilford Corner, MD;  [facility];  Service: Endoscopy;;  . POLYPECTOMY  02/02/2020   Procedure: POLYPECTOMY;  Surgeon: Otis Brace, MD;  Location: [facility];  Service: Gastroenterology;;  . RECTAL EXAM UNDER ANESTHESIA N/A 12/01/2014   Procedure: RECTAL EXAM UNDER ANESTHESIA;  Surgeon: [name], MD;  Location: [facility];  Service: General;  Laterality: N/A;  . TOTAL THYROIDECTOMY  10-13-2003  . TRANSTHORACIC ECHOCARDIOGRAM  07-11-2013   mild LVH,  ef 55%,  moderate AR,  mild MR and TR,  trivial PR  . TUBAL LIGATION      Social History   Socioeconomic History  . Marital status: Married    Spouse name: [name]  . Number of children: 3  . Years of education: 2  . Highest education level: Not on file  Occupational History  . Occupation: Retired    Fish farm manager: RETIRED  Tobacco Use  . Smoking status: Former Smoker    Packs/day: 1.00    Years: 35.00    Pack years: 35.00    Types: Cigarettes    Start date: 02/22/1967    Quit date: 11/27/2017    Years since quitting: 2.2  . Smokeless tobacco: Never Used  Vaping Use  . Vaping  Use: Never used  Substance and Sexual Activity  . Alcohol use: No  . Drug use: No  . Sexual activity: Never  Other Topics Concern  . Not on file  Social History Narrative   Patient is married [name]) and lives at home with her husband.   Patient has three children.   Patient is retired on disability.   Patient drinks two cups of caffeine daily.   Patient is right-handed.         Social Determinants of Health   Financial Resource Strain:   . Difficulty of Paying Living Expenses: Not on file  Food Insecurity:   . Worried About Charity fundraiser in the Last Year: Not on file  . Ran Out of Food in the Last Year: Not on file  Transportation Needs:   . Lack of Transportation (Medical): Not on file  . Lack of Transportation (Non-Medical): Not on file  Physical Activity:   . Days of Exercise per Week: Not on file  . Minutes of Exercise per Session: Not on file  Stress:   . Feeling of Stress : Not on file  Social Connections:   . Frequency of Communication with Friends and Family: Not on file  . Frequency of Social Gatherings with Friends and Family: Not on file  . Attends Religious Services: Not on file  . Active Member of Clubs or Organizations: Not on file  . Attends Archivist Meetings: Not on file  . Marital Status: Not on file  Intimate Partner Violence:   . Fear of Current or Ex-Partner: Not on file  . Emotionally Abused: Not on file  . Physically Abused: Not on file  . Sexually Abused: Not on file     Allergies  Allergen Reactions  . Mercaptopurine Other (See Comments)    Caused pancreatitis  . Remicade [Infliximab] Other (See Comments)    CAUSED JOINT PAIN     Review of Systems  Constitutional: Negative for chills, fever, malaise/fatigue and weight loss.  HENT: Negative for hearing loss, sore throat and tinnitus.   Eyes: Negative for blurred vision and double vision.  Respiratory: Positive for cough and shortness of breath. Negative for  hemoptysis, sputum production, wheezing and stridor.   Cardiovascular: Negative for chest pain, palpitations, orthopnea, leg swelling  and PND.  Gastrointestinal: Negative for abdominal pain, constipation, diarrhea, heartburn, nausea and vomiting.  Genitourinary: Negative for dysuria, hematuria and urgency.  Musculoskeletal: Negative for joint pain and myalgias.  Skin: Negative for itching and rash.  Neurological: Positive for headaches. Negative for dizziness, tingling and weakness.  Endo/Heme/Allergies: Negative for environmental allergies. Does not bruise/bleed easily.  Psychiatric/Behavioral: Negative for depression. The patient is not nervous/anxious and does not have insomnia.   All other systems reviewed and are negative.    Objective:  Physical Exam Vitals reviewed.  Constitutional:      General: She is not in acute distress.    Appearance: She is well-developed.  HENT:     Head: Normocephalic and atraumatic.     Mouth/Throat:     Pharynx: No oropharyngeal exudate.  Eyes:     Conjunctiva/sclera: Conjunctivae normal.     Pupils: Pupils are equal, round, and reactive to light.  Neck:     Vascular: No JVD.     Trachea: No tracheal deviation.     Comments: Loss of supraclavicular fat Cardiovascular:     Rate and Rhythm: Normal rate and regular rhythm.     Heart sounds: S1 normal and S2 normal.     Comments: Distant heart tones Pulmonary:     Effort: No tachypnea or accessory muscle usage.     Breath sounds: No stridor. Decreased breath sounds (throughout all lung fields) present. No wheezing, rhonchi or rales.  Abdominal:     General: Bowel sounds are normal. There is no distension.     Palpations: Abdomen is soft.     Tenderness: There is no abdominal tenderness.  Musculoskeletal:        General: Deformity (muscle wasting ) present.  Skin:    General: Skin is warm and dry.     Capillary Refill: Capillary refill takes less than 2 seconds.     Findings: No rash.    Neurological:     Mental Status: She is alert and oriented to person, place, and time.  Psychiatric:        Behavior: Behavior normal.      Vitals:   02/28/20 0820  BP: (!) 191/55  Pulse: 92  Resp: 17  Temp: 98.4 F (36.9 C)  SpO2: 94%  Weight: 51.7 kg  Height: 5' 4"  (1.626 m)   94% on 2L BMI Readings from Last 3 Encounters:  02/28/20 19.57 kg/m  02/23/20 20.43 kg/m  02/21/20 20.43 kg/m   Wt Readings from Last 3 Encounters:  02/28/20 51.7 kg  02/23/20 54 kg  02/21/20 54 kg     CBC    Component Value Date/Time   WBC 7.1 02/21/2020 0406   RBC 2.90 (L) 02/21/2020 0406   HGB 8.7 (L) 02/21/2020 0406   HGB 11.6 (L) 11/15/2019 1317   HCT 28.0 (L) 02/21/2020 0406   PLT 147 (L) 02/21/2020 0406   PLT 112 (L) 11/15/2019 1317   MCV 96.6 02/21/2020 0406   MCH 30.0 02/21/2020 0406   MCHC 31.1 02/21/2020 0406   RDW 19.2 (H) 02/21/2020 0406   LYMPHSABS 0.5 (L) 02/03/2020 1209   MONOABS 0.3 02/03/2020 1209   EOSABS 0.0 02/03/2020 1209   BASOSABS 0.0 02/03/2020 1209    Chest Imaging: 02/17/2020: Super D CT chest Enlarged left upper lobe lung mass, 3 x 3 cm, severe centrilobular emphysema and enlarged pulmonary arteries. The patient's images have been independently reviewed by me.    02/20/2020: MRI brain Multiple peripherally enhancing metastatic lesions.  Results reviewed  today 02/28/2020.  Pulmonary Functions Testing Results: PFT Results Latest Ref Rng & Units 03/29/2019 08/01/2013  FVC-Pre L 1.50 2.01  FVC-Predicted Pre % 72 81  FVC-Post L 1.56 2.00  FVC-Predicted Post % 74 81  Pre FEV1/FVC % % 36 49  Post FEV1/FCV % % 37 50  FEV1-Pre L 0.54 0.98  FEV1-Predicted Pre % 33 51  FEV1-Post L 0.57 1.00  DLCO uncorrected ml/min/mmHg 5.38 4.99  DLCO UNC% % 29 20  DLCO corrected ml/min/mmHg - 4.99  DLCO COR %Predicted % - 20  DLVA Predicted % 34 35  TLC L - 3.97  TLC % Predicted % - 78  RV % Predicted % - 74      Assessment & Plan:     ICD-10-CM   1. Lung  mass  R91.8 Procedural/ Surgical Case Request: VIDEO BRONCHOSCOPY WITH ENDOBRONCHIAL NAVIGATION   Added automatically from request for surgery [id number]  2. Lung mass  R91.8 Procedural/ Surgical Case Request: VIDEO BRONCHOSCOPY WITH ENDOBRONCHIAL NAVIGATION  3. 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 Is a new diagnosis.  Plan: Today we discussed proceeding with video bronchoscopy with navigation and transbronchial biopsies for tissue diagnosis. Patient is agreeable to proceed. We discussed the risk benefits and alternatives of this procedure. We discussed risk of bleeding as well as pneumothorax. Patient is agreeable to proceed.    Current Facility-Administered Medications:  .  0.9 %  sodium chloride infusion, 100 mL, Intravenous, PRN, Alric Seton, PA-C .  0.9 %  sodium chloride infusion, 100 mL, Intravenous, PRN, Alric Seton, PA-C .  0.9 %  sodium chloride infusion, , Intravenous, Continuous, Josephine Igo, MD .  alteplase (CATHFLO ACTIVASE) injection 2 mg, 2 mg, Intracatheter, Once PRN, Alric Seton, PA-C .  chlorhexidine (PERIDEX) 0.12 % solution 15 mL, 15 mL, Mouth/Throat, Once, Josephine Igo, MD .  chlorhexidine (PERIDEX) 0.12 % solution, , , ,  .  heparin injection 1,000 Units, 1,000 Units, Dialysis, PRN, Alric Seton, PA-C .  heparin injection 1,000 Units, 20 Units/kg, Dialysis, PRN, Corliss Parish, MD .  lidocaine (PF) (XYLOCAINE) 1 % injection 5 mL, 5 mL, Intradermal, PRN, Alric Seton, PA-C .  lidocaine-prilocaine (EMLA) cream 1 application, 1 application, Topical, PRN, Alric Seton, PA-C .  metoprolol tartrate (LOPRESSOR) 12.5 mg tablet, , , ,  .  metoprolol tartrate (LOPRESSOR) tablet 25 mg, 25 mg, Oral, Once, Josephine Igo, MD .  pentafluoroprop-tetrafluoroeth Landry Dyke) aerosol 1  application, 1 application, Topical, PRN, Alric Seton, PA-C  Time dedicated to the care of this patient on the date of this encounter to include pre-visit review of records, face-to-face time with the patient discussing conditions above, post visit ordering of testing, clinical documentation with the electronic health record, making appropriate referrals as documented, and communicating necessary findings to members of the patients care team.   Garner Nash, DO [facility] 02/28/2020 9:21 AM

## 2020-02-28 NOTE — Interval H&P Note (Signed)
History and Physical Interval Note:  02/28/2020 11:22 AM  Natasha [name]  has presented today for surgery, with the diagnosis of Lung mass.  The various methods of treatment have been discussed with the patient and family. After consideration of risks, benefits and other options for treatment, the patient has consented to  Procedure(s): [place] (N/A) as a surgical intervention.  The patient's history has been reviewed, patient examined, no change in status, stable for surgery.  I have reviewed the patient's chart and labs.  Questions were answered to the patient's satisfaction.     [place]

## 2020-02-28 NOTE — Discharge Instructions (Signed)
Flexible Bronchoscopy, Care After This sheet gives you information about how to care for yourself after your test. Your doctor may also give you more specific instructions. If you have problems or questions, contact your doctor. Follow these instructions at home: Eating and drinking  The day after the test, go back to your normal diet. Driving  Do not drive for 24 hours if you were given a medicine to help you relax (sedative).  Do not drive or use heavy machinery while taking prescription pain medicine. General instructions   Take over-the-counter and prescription medicines only as told by your doctor.  Return to your normal activities as told. Ask what activities are safe for you.  Do not use any products that have nicotine or tobacco in them. This includes cigarettes and e-cigarettes. If you need help quitting, ask your doctor.  Keep all follow-up visits as told by your doctor. This is important. It is very important if you had a tissue sample (biopsy) taken. Get help right away if:  You have shortness of breath that gets worse.  You get light-headed.  You feel like you are going to pass out (faint).  You have chest pain.  You cough up: ? More than a little blood. ? More blood than before. Summary  Do not eat or drink anything (not even water) for 2 hours after your test, or until your numbing medicine wears off.  Do not use cigarettes. Do not use e-cigarettes.  Get help right away if you have chest pain. This information is not intended to replace advice given to you by your health care provider. Make sure you discuss any questions you have with your health care provider. Document Revised: 04/24/2017 Document Reviewed: 05/30/2016 Elsevier Patient Education  2020 [employer].

## 2020-02-28 NOTE — Anesthesia Procedure Notes (Signed)
Procedure Name: Intubation Date/Time: 02/28/2020 3:20 PM Performed by: Renato Shin, CRNA Pre-anesthesia Checklist: Patient identified, Emergency Drugs available, Suction available and Patient being monitored Patient Re-evaluated:Patient Re-evaluated prior to induction Oxygen Delivery Method: Circle system utilized Preoxygenation: Pre-oxygenation with 100% oxygen Induction Type: IV induction Ventilation: Mask ventilation without difficulty Laryngoscope Size: Miller and 2 Grade View: Grade I Tube type: Oral Tube size: 8.0 mm Number of attempts: 1 Airway Equipment and Method: Stylet and Oral airway Placement Confirmation: ETT inserted through vocal cords under direct vision,  positive ETCO2 and breath sounds checked- equal and bilateral Secured at: 20 cm Tube secured with: Tape Dental Injury: Teeth and Oropharynx as per pre-operative assessment

## 2020-02-28 NOTE — Anesthesia Postprocedure Evaluation (Signed)
Anesthesia Post Note  Patient: Natasha [name]  Procedure(s) Performed: VIDEO BRONCHOSCOPY WITH ENDOBRONCHIAL NAVIGATION (N/A ) BRONCHIAL BRUSHINGS BRONCHIAL NEEDLE ASPIRATION BIOPSIES BRONCHIAL BIOPSIES BRONCHIAL WASHINGS     Patient location during evaluation: PACU Anesthesia Type: General Level of consciousness: awake and alert and oriented Pain management: pain level controlled Vital Signs Assessment: post-procedure vital signs reviewed and stable Respiratory status: spontaneous breathing, nonlabored ventilation, respiratory function stable and patient connected to nasal cannula oxygen Cardiovascular status: blood pressure returned to baseline and stable Postop Assessment: no apparent nausea or vomiting Anesthetic complications: no   No complications documented.  Last Vitals:  Vitals:   02/28/20 1630 02/28/20 1645  BP: (!) 156/74 (!) 154/69  Pulse: 86 83  Resp: 12 15  Temp:    SpO2: 100% 100%    Last Pain:  Vitals:   02/28/20 1645  TempSrc:   PainSc: 0-No pain                 Natasha Rochel A.

## 2020-02-28 NOTE — Progress Notes (Signed)
PCCM:  Patients labs pre-op are back with potassium of 6.2.   I have discussed case with Dr. Johnney Ou from nephrology.   Depending on if patient can get dialysis today we may still be able to do her case.   [place] ordered  Garner Nash, DO [facility] 02/28/2020 11:42 AM

## 2020-02-28 NOTE — Op Note (Signed)
Video Bronchoscopy with Electromagnetic Navigation Procedure Note  Date of Operation: 02/28/2020  Pre-op Diagnosis: Left upper lobe lung mass  Post-op Diagnosis: Left upper lobe lung mass  Surgeon: Garner Nash, DO  Assistants: none   Anesthesia: General endotracheal anesthesia  Operation: Flexible video fiberoptic bronchoscopy with electromagnetic navigation and biopsies.  Estimated Blood Loss: Minimal, <5IE  Complications: None   Indications and History: Natasha [name] is a 74 y.o. female with left upper lobe lung mass.  The risks, benefits, complications, treatment options and expected outcomes were discussed with the patient.  The possibilities of pneumothorax, pneumonia, reaction to medication, pulmonary aspiration, perforation of a viscus, bleeding, failure to diagnose a condition and creating a complication requiring transfusion or operation were discussed with the patient who freely signed the consent.    Description of Procedure: The patient was seen in the Preoperative Area, was examined and was deemed appropriate to proceed.  The patient was taken to [facility] endoscopy room 2, identified as Natasha [name] and the procedure verified as Flexible Video Fiberoptic Bronchoscopy.  A Time Out was held and the above information confirmed.   Prior to the date of the procedure a high-resolution CT scan of the chest was performed. Utilizing Dickinson a virtual tracheobronchial tree was generated to allow the creation of distinct navigation pathways to the patient's parenchymal abnormalities. After being taken to the operating room general anesthesia was initiated and the patient  was orally intubated. The video fiberoptic bronchoscope was introduced via the endotracheal tube and a general inspection was performed which showed normal right and left lung anatomy with no evidence of endobronchial disease, scattered pitting and distal bronchiectatic openings. The extendable  working channel and locator guide were introduced into the bronchoscope. The distinct navigation pathways prepared prior to this procedure were then utilized to navigate to within 1.5 cm of patient's lesion(s) identified on CT scan.  A full fluoroscopic sweep was obtained for local registration, inspiratory breath-hold with APL 25, sweep from RAO 25 degrees to LAO 25 degrees. The extendable working channel was secured into place and the locator guide was withdrawn. Under fluoroscopic guidance transbronchial needle brushings, transbronchial Wang needle biopsies, and transbronchial forceps biopsies were performed to be sent for cytology and pathology. A bronchioalveolar lavage was performed in the left upper lobe and sent for cytology. At the end of the procedure a general airway inspection was performed and there was no evidence of active bleeding.  The therapeutic bronchoscope was used for suctioning, aspiration, and clearance of the bilateral mainstem's from secretion and blood clot.  The bronchoscope was returned to just above the main carina and there was no evidence of active bleeding. The bronchoscope was removed.  The patient tolerated the procedure well. There was no significant blood loss and there were no obvious complications. A post-procedural chest x-ray is pending.  Samples: 1. Transbronchial needle brushings from left upper lobe 2. Transbronchial Wang needle biopsies from left upper lobe 3. Transbronchial forceps biopsies from left upper lobe 4. Bronchoalveolar lavage from left upper lobe  Plans:  The patient will be discharged from the PACU to home when recovered from anesthesia and after chest x-ray is reviewed. We will review the cytology, pathology and microbiology results with the patient when they become available. Outpatient followup will be with Garner Nash, DO.   Garner Nash, DO [facility] 02/28/2020 4:03 PM

## 2020-02-28 NOTE — H&P (View-Only) (Signed)
Synopsis: Referred in October 2021 for bronchoscopy evaluation, navigational bronchoscopy by Dr. Tamala Julian  Subjective:   PATIENT ID: Natasha [name] GENDER: female DOB: 10/07/1945, MRN: [id number]  This is a 74 year old female with a past medical history of seizure, PVD, PAF previously on Eliquis, congestive heart failure, hypertension, COPD, history of DVT, ESRD on IHD Monday Wednesday Friday.  She originally had a CT scan in June 2021 which revealed a left lower lobe mass.  She declined intervention or work-up at that time.  She had a repeat PET scan in June 2021 which showed a left-sided mass 2.3 x 1.9 cm with metastatic osseous lesions.  She went for a biopsy of the spine.  We try to get her in for bronchoscopy but declined.  She does have severe COPD at baseline with an FEV1 of 36% predicted.  The T10 lesion that was biopsied by our our revealed atypical lymphoid infiltrates but no carcinoma.  She declined bronchoscopy by Dr. Lamonte Sakai.  Patient presented back to the emergency department in September 2021 with neurologic changes.  She had imaging of the brain which revealed metastasis.  She would like work-up at this time for her procedure.  She was on Eliquis at the time also receiving dialysis and getting treatment with Decadron.  Therefore bronchoscopy was held until the following week.  She presents today to the hospital for navigational bronchoscopy with tissue diagnosis planned.   Past Medical History:  Diagnosis Date   Anal fistula    Anemia in chronic kidney disease (CKD)    Anxiety    Aortic insufficiency    Echo 3/18: mod conc LVH, EF 60-65, no RWMA, Gr 1 DD, mod AI, mild MR, normal RVSF, Trivial TR   Arthritis    Borderline hypertension    Bulging disc    L3-L4   Cancer (HCC)    Brain and spinal cord mets.   CHF (congestive heart failure) (HCC)    Chronic diarrhea    due to crohn's   CKD (chronic kidney disease), stage IV (HCC)    MWF- [place]   Crohn's  disease ([place])    chronic ileitis   Dyspnea    with exertion   Emphysema/COPD (HCC)    GERD (gastroesophageal reflux disease)    denies   History of blood transfusion    History of glaucoma    History of kidney stones    History of pancreatitis    2008--  mercaptopurine   History of small bowel obstruction    12-03-2010  due to crohn's ileitis   Hypertension    no longer on medications   Hypothyroidism, postsurgical    multinodule w/ hurthle cells   Osteoporosis    PAF (paroxysmal atrial fibrillation) (HCC)    Perianal Crohn's disease ([place])    Peripheral vascular disease ([place])    blood clot behind knee left leg   Polyarthralgia    Pulmonary embolism (HCC)    Seizures ([place])    03/2017   Wears partial dentures    upper     Family History  Problem Relation Age of Onset   Hypertension Father    Heart disease Father    Heart attack Father    Diverticulitis Mother    COPD Mother    Hypertension Mother    Heart disease Mother    Heart attack Mother    Heart attack Brother    Colon cancer Brother    Diabetes Brother    Thyroid disease Brother  Past Surgical History:  Procedure Laterality Date   ABDOMINAL HYSTERECTOMY  1990   and  Appendectomy   AV FISTULA PLACEMENT Left 12/08/2017   Procedure: INSERTION OF ARTERIOVENOUS (AV) GRAFT WITH ARTEGRAFT TO LEFT UPPER ARM;  Surgeon: Conrad [place], MD;  Location: [facility];  Service: Vascular;  Laterality: Left;   [place] Left 01/09/2017   Procedure: LEFT 1ST STAGE BRACHIAL VEIN TRANSPOSITION;  Surgeon: Conrad Tinsman, MD;  Location: [facility];  Service: Vascular;  Laterality: Left;   [place] Left 06/16/2017   Procedure: Second Stage BASILIC VEIN TRANSPOSITION  LEFT ARM;  Surgeon: Conrad [place], MD;  Location: [facility];  Service: Vascular;  Laterality: Left;   BIOPSY  03/09/2018   Procedure: BIOPSY;  Surgeon: Wilford Corner, MD;  [facility];   Service: Endoscopy;;   BIOPSY  01/31/2020   Procedure: BIOPSY;  Surgeon: Otis Brace, MD;  Location: [facility];  Service: Gastroenterology;;   BIOPSY  02/02/2020   Procedure: BIOPSY;  Surgeon: Otis Brace, MD;  Location: [facility];  Service: Gastroenterology;;   CHOLECYSTECTOMY     COLON RESECTION  x3 --  [place] x2/   RIGHT COLECTOMY 1989   COLONOSCOPY WITH PROPOFOL N/A 09/25/2014   Procedure: COLONOSCOPY WITH PROPOFOL;  Surgeon: Garlan Fair, MD;  [facility];  Service: Endoscopy;  Laterality: N/A;   COLONOSCOPY WITH PROPOFOL N/A 03/09/2018   Procedure: COLONOSCOPY WITH PROPOFOL;  Surgeon: Wilford Corner, MD;  [facility];  Service: Endoscopy;  Laterality: N/A;   COLONOSCOPY WITH PROPOFOL N/A 02/02/2020   Procedure: COLONOSCOPY WITH PROPOFOL;  Surgeon: Otis Brace, MD;  Location: [facility];  Service: Gastroenterology;  Laterality: N/A;   CYSTOSCOPY W/ URETERAL STENT PLACEMENT Right 11/19/2016   Procedure: CYSTOSCOPY WITH RIGHT RETROGRADE PYELOGRAM/ URETEROSCOPY WITH LASER AND RIGHT URETERAL STENT PLACEMENT;  Surgeon: Ardis Hughs, MD;  [facility];  Service: Urology;  Laterality: Right;   ESOPHAGOGASTRODUODENOSCOPY  03/04/2012   Procedure: ESOPHAGOGASTRODUODENOSCOPY (EGD);  Surgeon: Garlan Fair, MD;  Location[facility];  Service: Endoscopy;  Laterality: N/A;   ESOPHAGOGASTRODUODENOSCOPY (EGD) WITH PROPOFOL N/A 01/31/2020   Procedure: ESOPHAGOGASTRODUODENOSCOPY (EGD) WITH PROPOFOL;  Surgeon: Otis Brace, MD;  Location: [facility];  Service: Gastroenterology;  Laterality: N/A;   EXAMINATION UNDER ANESTHESIA N/A 09/12/2014   Procedure: EXAM UNDER ANESTHESIA;  Surgeon: Rolm Bookbinder, MD;  Location: [facility];  Service: General;  Laterality: N/A;   EYE SURGERY     laser   FLEXIBLE SIGMOIDOSCOPY  03/04/2012   Procedure: FLEXIBLE SIGMOIDOSCOPY;  Surgeon: Garlan Fair, MD;   [facility];  Service: Endoscopy;  Laterality: N/A;   GLAUCOMA SURGERY Bilateral    HOLMIUM LASER APPLICATION Right 0/62/3762   Procedure: HOLMIUM LASER APPLICATION;  Surgeon: Ardis Hughs, MD;  [facility];  Service: Urology;  Laterality: Right;   INCISION AND DRAINAGE PERIRECTAL ABSCESS N/A 09/12/2014   Procedure: IRRIGATION AND DEBRIDEMENT PERIRECTAL ABSCESS;  Surgeon: Rolm Bookbinder, MD;  Location: [facility];  Service: General;  Laterality: N/A;   IR FLUORO GUIDE CV LINE RIGHT  03/20/2017   IR US GUIDE VASC ACCESS RIGHT  03/20/2017   NEGATIVE SLEEP STUDY  2008   PLACEMENT OF SETON N/A 12/01/2014   Procedure: PLACEMENT OF SETON;  Surgeon: [name], MD;  Location[facility];  Service: General;  Laterality: N/A;   POLYPECTOMY  03/09/2018   Procedure: POLYPECTOMY;  Surgeon: Wilford Corner, MD;  [facility];  Service: Endoscopy;;   POLYPECTOMY  02/02/2020   Procedure: POLYPECTOMY;  Surgeon: Otis Brace, MD;  Location: [facility];  Service: Gastroenterology;;   RECTAL EXAM UNDER ANESTHESIA N/A 12/01/2014   Procedure: RECTAL EXAM UNDER ANESTHESIA;  Surgeon: [name], MD;  Location: [facility];  Service: General;  Laterality: N/A;   TOTAL THYROIDECTOMY  10-13-2003   TRANSTHORACIC ECHOCARDIOGRAM  07-11-2013   mild LVH,  ef 55%,  moderate AR,  mild MR and TR,  trivial PR   TUBAL LIGATION      Social History   Socioeconomic History   Marital status: Married    Spouse name: [name]   Number of children: 3   Years of education: 12   Highest education level: Not on file  Occupational History   Occupation: Retired    Fish farm manager: RETIRED  Tobacco Use   Smoking status: Former Smoker    Packs/day: 1.00    Years: 35.00    Pack years: 35.00    Types: Cigarettes    Start date: 02/22/1967    Quit date: 11/27/2017    Years since quitting: 2.2   Smokeless tobacco: Never Used  Vaping Use   Vaping  Use: Never used  Substance and Sexual Activity   Alcohol use: No   Drug use: No   Sexual activity: Never  Other Topics Concern   Not on file  Social History Narrative   Patient is married [name]) and lives at home with her husband.   Patient has three children.   Patient is retired on disability.   Patient drinks two cups of caffeine daily.   Patient is right-handed.         Social Determinants of Health   Financial Resource Strain:    Difficulty of Paying Living Expenses: Not on file  Food Insecurity:    Worried About Charity fundraiser in the Last Year: Not on file   [employer] of Food in the Last Year: Not on file  Transportation Needs:    Lack of Transportation (Medical): Not on file   Lack of Transportation (Non-Medical): Not on file  Physical Activity:    Days of Exercise per Week: Not on file   Minutes of Exercise per Session: Not on file  Stress:    Feeling of Stress : Not on file  Social Connections:    Frequency of Communication with Friends and Family: Not on file   Frequency of Social Gatherings with Friends and Family: Not on file   Attends Religious Services: Not on file   Active Member of Clubs or Organizations: Not on file   Attends Archivist Meetings: Not on file   Marital Status: Not on file  Intimate Partner Violence:    Fear of Current or Ex-Partner: Not on file   Emotionally Abused: Not on file   Physically Abused: Not on file   Sexually Abused: Not on file     Allergies  Allergen Reactions   Mercaptopurine Other (See Comments)    Caused pancreatitis   Remicade [Infliximab] Other (See Comments)    CAUSED JOINT PAIN     Review of Systems  Constitutional: Negative for chills, fever, malaise/fatigue and weight loss.  HENT: Negative for hearing loss, sore throat and tinnitus.   Eyes: Negative for blurred vision and double vision.  Respiratory: Positive for cough and shortness of breath. Negative for  hemoptysis, sputum production, wheezing and stridor.   Cardiovascular: Negative for chest pain, palpitations, orthopnea, leg swelling  and PND.  Gastrointestinal: Negative for abdominal pain, constipation, diarrhea, heartburn, nausea and vomiting.  Genitourinary: Negative for dysuria, hematuria and urgency.  Musculoskeletal: Negative for joint pain and myalgias.  Skin: Negative for itching and rash.  Neurological: Positive for headaches. Negative for dizziness, tingling and weakness.  Endo/Heme/Allergies: Negative for environmental allergies. Does not bruise/bleed easily.  Psychiatric/Behavioral: Negative for depression. The patient is not nervous/anxious and does not have insomnia.   All other systems reviewed and are negative.    Objective:  Physical Exam Vitals reviewed.  Constitutional:      General: She is not in acute distress.    Appearance: She is well-developed.  HENT:     Head: Normocephalic and atraumatic.     Mouth/Throat:     Pharynx: No oropharyngeal exudate.  Eyes:     Conjunctiva/sclera: Conjunctivae normal.     Pupils: Pupils are equal, round, and reactive to light.  Neck:     Vascular: No JVD.     Trachea: No tracheal deviation.     Comments: Loss of supraclavicular fat Cardiovascular:     Rate and Rhythm: Normal rate and regular rhythm.     Heart sounds: S1 normal and S2 normal.     Comments: Distant heart tones Pulmonary:     Effort: No tachypnea or accessory muscle usage.     Breath sounds: No stridor. Decreased breath sounds (throughout all lung fields) present. No wheezing, rhonchi or rales.  Abdominal:     General: Bowel sounds are normal. There is no distension.     Palpations: Abdomen is soft.     Tenderness: There is no abdominal tenderness.  Musculoskeletal:        General: Deformity (muscle wasting ) present.  Skin:    General: Skin is warm and dry.     Capillary Refill: Capillary refill takes less than 2 seconds.     Findings: No rash.    Neurological:     Mental Status: She is alert and oriented to person, place, and time.  Psychiatric:        Behavior: Behavior normal.      Vitals:   02/28/20 0820  BP: (!) 191/55  Pulse: 92  Resp: 17  Temp: 98.4 F (36.9 C)  SpO2: 94%  Weight: 51.7 kg  Height: 5' 4"  (1.626 m)   94% on 2L BMI Readings from Last 3 Encounters:  02/28/20 19.57 kg/m  02/23/20 20.43 kg/m  02/21/20 20.43 kg/m   Wt Readings from Last 3 Encounters:  02/28/20 51.7 kg  02/23/20 54 kg  02/21/20 54 kg     CBC    Component Value Date/Time   WBC 7.1 02/21/2020 0406   RBC 2.90 (L) 02/21/2020 0406   HGB 8.7 (L) 02/21/2020 0406   HGB 11.6 (L) 11/15/2019 1317   HCT 28.0 (L) 02/21/2020 0406   PLT 147 (L) 02/21/2020 0406   PLT 112 (L) 11/15/2019 1317   MCV 96.6 02/21/2020 0406   MCH 30.0 02/21/2020 0406   MCHC 31.1 02/21/2020 0406   RDW 19.2 (H) 02/21/2020 0406   LYMPHSABS 0.5 (L) 02/03/2020 1209   MONOABS 0.3 02/03/2020 1209   EOSABS 0.0 02/03/2020 1209   BASOSABS 0.0 02/03/2020 1209    Chest Imaging: 02/17/2020: Super D CT chest Enlarged left upper lobe lung mass, 3 x 3 cm, severe centrilobular emphysema and enlarged pulmonary arteries. The patient's images have been independently reviewed by me.    02/20/2020: MRI brain Multiple peripherally enhancing metastatic lesions.  Results reviewed  today 02/28/2020.  Pulmonary Functions Testing Results: PFT Results Latest Ref Rng & Units 03/29/2019 08/01/2013  FVC-Pre L 1.50 2.01  FVC-Predicted Pre % 72 81  FVC-Post L 1.56 2.00  FVC-Predicted Post % 74 81  Pre FEV1/FVC % % 36 49  Post FEV1/FCV % % 37 50  FEV1-Pre L 0.54 0.98  FEV1-Predicted Pre % 33 51  FEV1-Post L 0.57 1.00  DLCO uncorrected ml/min/mmHg 5.38 4.99  DLCO UNC% % 29 20  DLCO corrected ml/min/mmHg - 4.99  DLCO COR %Predicted % - 20  DLVA Predicted % 34 35  TLC L - 3.97  TLC % Predicted % - 78  RV % Predicted % - 74      Assessment & Plan:     ICD-10-CM   1. Lung  mass  R91.8 Procedural/ Surgical Case Request: VIDEO BRONCHOSCOPY WITH ENDOBRONCHIAL NAVIGATION   Added automatically from request for surgery [id number]  2. Lung mass  R91.8 Procedural/ Surgical Case Request: VIDEO BRONCHOSCOPY WITH ENDOBRONCHIAL NAVIGATION  3. 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 Is a new diagnosis.  Plan: Today we discussed proceeding with video bronchoscopy with navigation and transbronchial biopsies for tissue diagnosis. Patient is agreeable to proceed. We discussed the risk benefits and alternatives of this procedure. We discussed risk of bleeding as well as pneumothorax. Patient is agreeable to proceed.    Current Facility-Administered Medications:    0.9 %  sodium chloride infusion, 100 mL, Intravenous, PRN, Alric Seton, PA-C   0.9 %  sodium chloride infusion, 100 mL, Intravenous, PRN, Alric Seton, PA-C   0.9 %  sodium chloride infusion, , Intravenous, Continuous, Josephine Igo, MD   alteplase (CATHFLO ACTIVASE) injection 2 mg, 2 mg, Intracatheter, Once PRN, Alric Seton, PA-C   chlorhexidine (PERIDEX) 0.12 % solution 15 mL, 15 mL, Mouth/Throat, Once, Josephine Igo, MD   chlorhexidine (PERIDEX) 0.12 % solution, , , ,    heparin injection 1,000 Units, 1,000 Units, Dialysis, PRN, Alric Seton, PA-C   heparin injection 1,000 Units, 20 Units/kg, Dialysis, PRN, Corliss Parish, MD   lidocaine (PF) (XYLOCAINE) 1 % injection 5 mL, 5 mL, Intradermal, PRN, Alric Seton, PA-C   lidocaine-prilocaine (EMLA) cream 1 application, 1 application, Topical, PRN, Alric Seton, PA-C   metoprolol tartrate (LOPRESSOR) 12.5 mg tablet, , , ,    metoprolol tartrate (LOPRESSOR) tablet 25 mg, 25 mg, Oral, Once, Josephine Igo, MD   pentafluoroprop-tetrafluoroeth Landry Dyke) aerosol 1  application, 1 application, Topical, PRN, Alric Seton, PA-C  Time dedicated to the care of this patient on the date of this encounter to include pre-visit review of records, face-to-face time with the patient discussing conditions above, post visit ordering of testing, clinical documentation with the electronic health record, making appropriate referrals as documented, and communicating necessary findings to members of the patients care team.   Garner Nash, DO [facility] 02/28/2020 9:21 AM

## 2020-02-29 ENCOUNTER — Ambulatory Visit (HOSPITAL_COMMUNITY)
Admission: RE | Admit: 2020-02-29 | Discharge: 2020-02-29 | Disposition: A | Discharge status: Home / Self Care | Payer: Medicare Other | Source: Ambulatory Visit | Attending: Radiation Oncology | Admitting: Radiation Oncology

## 2020-02-29 ENCOUNTER — Emergency Department (HOSPITAL_COMMUNITY): Payer: Medicare Other

## 2020-02-29 ENCOUNTER — Other Ambulatory Visit: Payer: Self-pay | Admitting: Radiation Oncology

## 2020-02-29 ENCOUNTER — Inpatient Hospital Stay (HOSPITAL_COMMUNITY)
Admission: EM | Admit: 2020-02-29 | Discharge: 2020-03-03 | DRG: 166 | Disposition: A | Discharge status: Home Health | Payer: Medicare Other | Attending: Internal Medicine | Admitting: Internal Medicine

## 2020-02-29 ENCOUNTER — Other Ambulatory Visit: Payer: Self-pay

## 2020-02-29 ENCOUNTER — Inpatient Hospital Stay (HOSPITAL_COMMUNITY): Payer: Medicare Other

## 2020-02-29 DIAGNOSIS — J449 Chronic obstructive pulmonary disease, unspecified: Secondary | ICD-10-CM | POA: Diagnosis present

## 2020-02-29 DIAGNOSIS — I5032 Chronic diastolic (congestive) heart failure: Secondary | ICD-10-CM | POA: Diagnosis present

## 2020-02-29 DIAGNOSIS — Z9689 Presence of other specified functional implants: Secondary | ICD-10-CM

## 2020-02-29 DIAGNOSIS — F419 Anxiety disorder, unspecified: Secondary | ICD-10-CM | POA: Diagnosis present

## 2020-02-29 DIAGNOSIS — J9600 Acute respiratory failure, unspecified whether with hypoxia or hypercapnia: Secondary | ICD-10-CM | POA: Diagnosis present

## 2020-02-29 DIAGNOSIS — I2699 Other pulmonary embolism without acute cor pulmonale: Secondary | ICD-10-CM | POA: Diagnosis present

## 2020-02-29 DIAGNOSIS — R54 Age-related physical debility: Secondary | ICD-10-CM | POA: Diagnosis present

## 2020-02-29 DIAGNOSIS — Z20822 Contact with and (suspected) exposure to covid-19: Secondary | ICD-10-CM | POA: Diagnosis present

## 2020-02-29 DIAGNOSIS — C7931 Secondary malignant neoplasm of brain: Secondary | ICD-10-CM | POA: Diagnosis present

## 2020-02-29 DIAGNOSIS — Z86711 Personal history of pulmonary embolism: Secondary | ICD-10-CM

## 2020-02-29 DIAGNOSIS — Z9981 Dependence on supplemental oxygen: Secondary | ICD-10-CM

## 2020-02-29 DIAGNOSIS — D508 Other iron deficiency anemias: Secondary | ICD-10-CM

## 2020-02-29 DIAGNOSIS — I480 Paroxysmal atrial fibrillation: Secondary | ICD-10-CM | POA: Diagnosis present

## 2020-02-29 DIAGNOSIS — Y838 Other surgical procedures as the cause of abnormal reaction of the patient, or of later complication, without mention of misadventure at the time of the procedure: Secondary | ICD-10-CM | POA: Diagnosis not present

## 2020-02-29 DIAGNOSIS — J939 Pneumothorax, unspecified: Secondary | ICD-10-CM

## 2020-02-29 DIAGNOSIS — M810 Age-related osteoporosis without current pathological fracture: Secondary | ICD-10-CM | POA: Diagnosis present

## 2020-02-29 DIAGNOSIS — I132 Hypertensive heart and chronic kidney disease with heart failure and with stage 5 chronic kidney disease, or end stage renal disease: Secondary | ICD-10-CM | POA: Diagnosis present

## 2020-02-29 DIAGNOSIS — C7949 Secondary malignant neoplasm of other parts of nervous system: Secondary | ICD-10-CM | POA: Diagnosis not present

## 2020-02-29 DIAGNOSIS — E890 Postprocedural hypothyroidism: Secondary | ICD-10-CM | POA: Diagnosis present

## 2020-02-29 DIAGNOSIS — Z8616 Personal history of COVID-19: Secondary | ICD-10-CM | POA: Diagnosis not present

## 2020-02-29 DIAGNOSIS — Z90710 Acquired absence of both cervix and uterus: Secondary | ICD-10-CM

## 2020-02-29 DIAGNOSIS — Z888 Allergy status to other drugs, medicaments and biological substances status: Secondary | ICD-10-CM

## 2020-02-29 DIAGNOSIS — Z515 Encounter for palliative care: Secondary | ICD-10-CM | POA: Diagnosis not present

## 2020-02-29 DIAGNOSIS — K219 Gastro-esophageal reflux disease without esophagitis: Secondary | ICD-10-CM | POA: Diagnosis present

## 2020-02-29 DIAGNOSIS — I739 Peripheral vascular disease, unspecified: Secondary | ICD-10-CM | POA: Diagnosis present

## 2020-02-29 DIAGNOSIS — Z960 Presence of urogenital implants: Secondary | ICD-10-CM | POA: Diagnosis present

## 2020-02-29 DIAGNOSIS — N186 End stage renal disease: Secondary | ICD-10-CM | POA: Diagnosis present

## 2020-02-29 DIAGNOSIS — J439 Emphysema, unspecified: Secondary | ICD-10-CM | POA: Diagnosis not present

## 2020-02-29 DIAGNOSIS — Z992 Dependence on renal dialysis: Secondary | ICD-10-CM | POA: Diagnosis not present

## 2020-02-29 DIAGNOSIS — J9621 Acute and chronic respiratory failure with hypoxia: Secondary | ICD-10-CM | POA: Diagnosis present

## 2020-02-29 DIAGNOSIS — Z7951 Long term (current) use of inhaled steroids: Secondary | ICD-10-CM

## 2020-02-29 DIAGNOSIS — Z79891 Long term (current) use of opiate analgesic: Secondary | ICD-10-CM

## 2020-02-29 DIAGNOSIS — Z9049 Acquired absence of other specified parts of digestive tract: Secondary | ICD-10-CM

## 2020-02-29 DIAGNOSIS — Z86718 Personal history of other venous thrombosis and embolism: Secondary | ICD-10-CM

## 2020-02-29 DIAGNOSIS — E875 Hyperkalemia: Secondary | ICD-10-CM | POA: Diagnosis present

## 2020-02-29 DIAGNOSIS — Z7901 Long term (current) use of anticoagulants: Secondary | ICD-10-CM

## 2020-02-29 DIAGNOSIS — Z79890 Hormone replacement therapy (postmenopausal): Secondary | ICD-10-CM

## 2020-02-29 DIAGNOSIS — Z87891 Personal history of nicotine dependence: Secondary | ICD-10-CM

## 2020-02-29 DIAGNOSIS — N2581 Secondary hyperparathyroidism of renal origin: Secondary | ICD-10-CM | POA: Diagnosis present

## 2020-02-29 DIAGNOSIS — D631 Anemia in chronic kidney disease: Secondary | ICD-10-CM | POA: Diagnosis present

## 2020-02-29 DIAGNOSIS — R0602 Shortness of breath: Secondary | ICD-10-CM

## 2020-02-29 DIAGNOSIS — C3482 Malignant neoplasm of overlapping sites of left bronchus and lung: Secondary | ICD-10-CM | POA: Diagnosis present

## 2020-02-29 DIAGNOSIS — Z66 Do not resuscitate: Secondary | ICD-10-CM | POA: Diagnosis present

## 2020-02-29 DIAGNOSIS — Z7189 Other specified counseling: Secondary | ICD-10-CM | POA: Diagnosis not present

## 2020-02-29 DIAGNOSIS — J95811 Postprocedural pneumothorax: Secondary | ICD-10-CM | POA: Diagnosis not present

## 2020-02-29 DIAGNOSIS — Z79899 Other long term (current) drug therapy: Secondary | ICD-10-CM

## 2020-02-29 DIAGNOSIS — J9601 Acute respiratory failure with hypoxia: Secondary | ICD-10-CM | POA: Diagnosis not present

## 2020-02-29 DIAGNOSIS — D649 Anemia, unspecified: Secondary | ICD-10-CM

## 2020-02-29 LAB — I-STAT VENOUS BLOOD GAS, ED
Acid-base deficit: 4 mmol/L — ABNORMAL HIGH (ref 0.0–2.0)
Bicarbonate: 18.5 mmol/L — ABNORMAL LOW (ref 20.0–28.0)
Calcium, Ion: 0.82 mmol/L — CL (ref 1.15–1.40)
HCT: 17 % — ABNORMAL LOW (ref 36.0–46.0)
Hemoglobin: 5.8 g/dL — CL (ref 12.0–15.0)
O2 Saturation: 100 %
Potassium: 4.5 mmol/L (ref 3.5–5.1)
Sodium: 139 mmol/L (ref 135–145)
TCO2: 19 mmol/L — ABNORMAL LOW (ref 22–32)
pCO2, Ven: 23.4 mmHg — ABNORMAL LOW (ref 44.0–60.0)
pH, Ven: 7.507 — ABNORMAL HIGH (ref 7.250–7.430)
pO2, Ven: 174 mmHg — ABNORMAL HIGH (ref 32.0–45.0)

## 2020-02-29 LAB — I-STAT CHEM 8, ED
BUN: 70 mg/dL — ABNORMAL HIGH (ref 8–23)
Calcium, Ion: 0.84 mmol/L — CL (ref 1.15–1.40)
Chloride: 110 mmol/L (ref 98–111)
Creatinine, Ser: 6.8 mg/dL — ABNORMAL HIGH (ref 0.44–1.00)
Glucose, Bld: 143 mg/dL — ABNORMAL HIGH (ref 70–99)
HCT: 19 % — ABNORMAL LOW (ref 36.0–46.0)
Hemoglobin: 6.5 g/dL — CL (ref 12.0–15.0)
Potassium: 4.4 mmol/L (ref 3.5–5.1)
Sodium: 139 mmol/L (ref 135–145)
TCO2: 19 mmol/L — ABNORMAL LOW (ref 22–32)

## 2020-02-29 LAB — PROTIME-INR
INR: 1 (ref 0.8–1.2)
Prothrombin Time: 12.9 seconds (ref 11.4–15.2)

## 2020-02-29 LAB — CBC WITH DIFFERENTIAL/PLATELET
Abs Immature Granulocytes: 0.21 10*3/uL — ABNORMAL HIGH (ref 0.00–0.07)
Basophils Absolute: 0 10*3/uL (ref 0.0–0.1)
Basophils Relative: 0 %
Eosinophils Absolute: 0 10*3/uL (ref 0.0–0.5)
Eosinophils Relative: 0 %
HCT: 22.3 % — ABNORMAL LOW (ref 36.0–46.0)
Hemoglobin: 6.9 g/dL — CL (ref 12.0–15.0)
Immature Granulocytes: 2 %
Lymphocytes Relative: 2 %
Lymphs Abs: 0.3 10*3/uL — ABNORMAL LOW (ref 0.7–4.0)
MCH: 29.7 pg (ref 26.0–34.0)
MCHC: 30.9 g/dL (ref 30.0–36.0)
MCV: 96.1 fL (ref 80.0–100.0)
Monocytes Absolute: 0.6 10*3/uL (ref 0.1–1.0)
Monocytes Relative: 4 %
Neutro Abs: 12.6 10*3/uL — ABNORMAL HIGH (ref 1.7–7.7)
Neutrophils Relative %: 92 %
Platelets: 132 10*3/uL — ABNORMAL LOW (ref 150–400)
RBC: 2.32 MIL/uL — ABNORMAL LOW (ref 3.87–5.11)
RDW: 19 % — ABNORMAL HIGH (ref 11.5–15.5)
WBC: 13.7 10*3/uL — ABNORMAL HIGH (ref 4.0–10.5)
nRBC: 0 % (ref 0.0–0.2)

## 2020-02-29 LAB — TROPONIN I (HIGH SENSITIVITY): Troponin I (High Sensitivity): 63 ng/L — ABNORMAL HIGH (ref <18)

## 2020-02-29 LAB — RESPIRATORY PANEL BY RT PCR (FLU A&B, COVID)
Influenza A by PCR: NEGATIVE
Influenza B by PCR: NEGATIVE
SARS Coronavirus 2 by RT PCR: NEGATIVE

## 2020-02-29 LAB — BRAIN NATRIURETIC PEPTIDE: B Natriuretic Peptide: 669.3 pg/mL — ABNORMAL HIGH (ref 0.0–100.0)

## 2020-02-29 LAB — PREPARE RBC (CROSSMATCH)

## 2020-02-29 LAB — POC OCCULT BLOOD, ED: Fecal Occult Bld: NEGATIVE

## 2020-02-29 MED ORDER — SODIUM CHLORIDE 0.9% IV SOLUTION: Freq: Once | INTRAVENOUS | Status: DC

## 2020-02-29 MED ORDER — SODIUM CHLORIDE 0.9% FLUSH
3.0000 mL | Freq: Two times a day (BID) | INTRAVENOUS | Status: DC
  Administered 2020-03-01 – 2020-03-03 (×6): 3 mL via INTRAVENOUS

## 2020-02-29 MED ORDER — OXYCODONE HCL 5 MG PO TABS
5.0000 mg | ORAL_TABLET | Freq: Four times a day (QID) | ORAL | Status: DC | PRN
  Administered 2020-02-29 – 2020-03-01 (×3): 5 mg via ORAL
  Filled 2020-02-29 (×2): qty 1

## 2020-02-29 MED ORDER — CALCIUM ACETATE (PHOS BINDER) 667 MG PO CAPS
667.0000 mg | ORAL_CAPSULE | Freq: Three times a day (TID) | ORAL | Status: DC
  Administered 2020-03-01 – 2020-03-03 (×5): 667 mg via ORAL
  Filled 2020-02-29 (×5): qty 1

## 2020-02-29 MED ORDER — LEVOTHYROXINE SODIUM 25 MCG PO TABS
125.0000 ug | ORAL_TABLET | Freq: Every day | ORAL | Status: DC
  Administered 2020-03-01 – 2020-03-03 (×3): 125 ug via ORAL
  Filled 2020-02-29 (×3): qty 1

## 2020-02-29 MED ORDER — PANTOPRAZOLE SODIUM 40 MG PO TBEC
40.0000 mg | DELAYED_RELEASE_TABLET | Freq: Two times a day (BID) | ORAL | Status: DC
  Administered 2020-02-29 – 2020-03-03 (×6): 40 mg via ORAL
  Filled 2020-02-29 (×6): qty 1

## 2020-02-29 MED ORDER — ALBUTEROL SULFATE HFA 108 (90 BASE) MCG/ACT IN AERS
2.0000 | INHALATION_SPRAY | Freq: Four times a day (QID) | RESPIRATORY_TRACT | Status: DC | PRN
  Filled 2020-02-29: qty 6.7

## 2020-02-29 MED ORDER — LIDOCAINE 5 % EX PTCH
1.0000 | MEDICATED_PATCH | CUTANEOUS | Status: DC
  Administered 2020-02-29 – 2020-03-02 (×3): 1 via TRANSDERMAL
  Filled 2020-02-29 (×3): qty 1

## 2020-02-29 MED ORDER — METOPROLOL TARTRATE 25 MG PO TABS
25.0000 mg | ORAL_TABLET | Freq: Two times a day (BID) | ORAL | Status: DC
  Administered 2020-02-29 – 2020-03-03 (×6): 25 mg via ORAL
  Filled 2020-02-29 (×7): qty 1

## 2020-02-29 MED ORDER — IPRATROPIUM BROMIDE 0.02 % IN SOLN
0.5000 mg | Freq: Once | RESPIRATORY_TRACT | Status: AC
  Administered 2020-02-29: 0.5 mg via RESPIRATORY_TRACT
  Filled 2020-02-29: qty 2.5

## 2020-02-29 MED ORDER — FLUTICASONE-UMECLIDIN-VILANT 100-62.5-25 MCG/INH IN AEPB: 1.0000 | INHALATION_SPRAY | Freq: Every day | RESPIRATORY_TRACT | Status: DC

## 2020-02-29 MED ORDER — CHLORHEXIDINE GLUCONATE CLOTH 2 % EX PADS
6.0000 | MEDICATED_PAD | Freq: Every day | CUTANEOUS | Status: DC
  Administered 2020-03-01: 6 via TOPICAL

## 2020-02-29 MED ORDER — ALBUTEROL (5 MG/ML) CONTINUOUS INHALATION SOLN
10.0000 mg/h | INHALATION_SOLUTION | Freq: Once | RESPIRATORY_TRACT | Status: AC
  Administered 2020-02-29: 10 mg/h via RESPIRATORY_TRACT
  Filled 2020-02-29: qty 20

## 2020-02-29 MED ORDER — DEXAMETHASONE 4 MG PO TABS
4.0000 mg | ORAL_TABLET | Freq: Three times a day (TID) | ORAL | Status: DC
  Administered 2020-02-29 – 2020-03-03 (×8): 4 mg via ORAL
  Filled 2020-02-29 (×10): qty 1

## 2020-02-29 MED ORDER — LIDOCAINE-EPINEPHRINE 1 %-1:100000 IJ SOLN
10.0000 mL | Freq: Once | INTRAMUSCULAR | Status: AC
  Administered 2020-02-29: 10 mL
  Filled 2020-02-29: qty 1

## 2020-02-29 MED ORDER — APIXABAN 2.5 MG PO TABS
2.5000 mg | ORAL_TABLET | Freq: Two times a day (BID) | ORAL | Status: DC
  Administered 2020-03-01 – 2020-03-03 (×5): 2.5 mg via ORAL
  Filled 2020-02-29 (×5): qty 1

## 2020-02-29 MED ORDER — SODIUM CHLORIDE 0.9% FLUSH
10.0000 mL | Freq: Three times a day (TID) | INTRAVENOUS | Status: DC
  Administered 2020-03-01 – 2020-03-03 (×3): 10 mL

## 2020-02-29 MED ORDER — CALCIUM CARBONATE-VITAMIN D 500-200 MG-UNIT PO TABS
1.0000 | ORAL_TABLET | ORAL | Status: DC | PRN
  Filled 2020-02-29 (×2): qty 1

## 2020-02-29 MED ORDER — ONDANSETRON 4 MG PO TBDP: 4.0000 mg | ORAL_TABLET | Freq: Three times a day (TID) | ORAL | Status: DC | PRN

## 2020-02-29 MED ORDER — CALCITRIOL 0.25 MCG PO CAPS: 0.2500 ug | ORAL_CAPSULE | ORAL | Status: DC

## 2020-02-29 MED ORDER — IPRATROPIUM-ALBUTEROL 0.5-2.5 (3) MG/3ML IN SOLN: 3.0000 mL | Freq: Four times a day (QID) | RESPIRATORY_TRACT | Status: DC | PRN

## 2020-02-29 MED ORDER — NITROGLYCERIN 0.4 MG SL SUBL: 0.4000 mg | SUBLINGUAL_TABLET | SUBLINGUAL | Status: DC | PRN

## 2020-02-29 NOTE — Progress Notes (Signed)
PCCM:  CT reviewed. PTX still present. Chest tube in deep. Pulled back pigtail 5cm.  Resutured and redressed.  Flushed with saline to ensure patency  Repeat cxr.   Garner Nash, DO [facility] 02/29/2020 9:28 PM

## 2020-02-29 NOTE — Procedures (Signed)
Insertion of Chest Tube Procedure Note  Natasha [name]  [id number]  August 06, 1945  Date:02/29/20  Time:6:26 PM    Provider Performing: Merlene Laughter under direction Candee Furbish MD  Procedure: Pleural Catheter Insertion w/ Imaging Guidance [phone])  Indication(s) Pneumothorax  Consent Risks of the procedure as well as the alternatives and risks of each were explained to the patient and/or caregiver.  Consent for the procedure was obtained and is signed in the bedside chart  Anesthesia Topical only with 1% lidocaine    Time Out Verified patient identification, verified procedure, site/side was marked, verified correct patient position, special equipment/implants available, medications/allergies/relevant history reviewed, required imaging and test results available.   Sterile Technique Maximal sterile technique including full sterile barrier drape, hand hygiene, sterile gown, sterile gloves, mask, hair covering, sterile ultrasound probe cover (if used).   Procedure Description Ultrasound used to identify appropriate pleural anatomy for placement and overlying skin marked. Area of placement cleaned and draped in sterile fashion.  A 14 French chest tube was placed into the left pleural space using Seldinger technique. Appropriate return of air was obtained.  The tube was connected to atrium and placed on -20 cm H2O wall suction.   Complications/Tolerance None; patient tolerated the procedure well. Chest X-ray is ordered to verify placement.   EBL Minimal  Specimen(s) none

## 2020-02-29 NOTE — ED Notes (Signed)
Timeout performed with Treatment Team for Chest Tube Insertion. Tamala Julian, MD, Lunette Stands, RN, Lenis Nettleton, RN and [place], [place] present at bedside along with Merlene Laughter, MSN, RN. Natasha is Natasha [name]; procedure is Chest Tube Insertion (Left Side) indicated for Pneumothorax of the Left Side. All in agreement including Natasha [name].

## 2020-02-29 NOTE — ED Provider Notes (Signed)
[facility] EMERGENCY DEPARTMENT Provider Note   CSN: [id number] Arrival date & time: 02/29/20  1437     History Chief Complaint  Patient presents with  . Shortness of Breath    Natasha [name] is a 74 y.o. female.  The history is provided by the patient, the spouse and medical records. No language interpreter was used.  Shortness of Breath  Natasha [name] is a 74 y.o. female who presents to the Emergency Department complaining of shortness of breath. Level V caveat due to altered mental status and acuity of condition. She presents the emergency department from dialysis center by EMS for evaluation of shortness of breath. She presented from the dialysis center for increased difficulty breathing. She is on 3 L of nasal cannula oxygen at baseline. She did have an outpatient MRI performed this morning and the plan was to have dialysis after receiving contrast. She normally dialysis Monday, Wednesday, Friday. She missed her Monday session due to weakness but was able to be dialyzed Tuesday prior to an outpatient bronchoscopy being performed. She is currently getting a workup for lung mass. She denies any fevers. She does feel short of breath. Husband states that she has needed 5 L of nasal cannula oxygen at home for the last few days. She has been fully vaccinated for COVID-19. She denies any current chest pain. Symptoms are severe, constant, worsening. She dialyzed is at [facility]. She only received 58 minutes of her treatment of her four hour session today.      Past Medical History:  Diagnosis Date  . Anal fistula   . Anemia in chronic kidney disease (CKD)   . Anxiety   . Aortic insufficiency    Echo 3/18: mod conc LVH, EF 60-65, no RWMA, Gr 1 DD, mod AI, mild MR, normal RVSF, Trivial TR  . Arthritis   . Borderline hypertension   . Bulging disc    L3-L4  . Cancer ([place])    Brain and spinal cord mets.  . CHF (congestive heart failure) ([place])    . Chronic diarrhea    due to crohn's  . CKD (chronic kidney disease), stage IV (HCC)    MWF- [facility] [place]  . Crohn's disease ([place])    chronic ileitis  . Dyspnea    with exertion  . Emphysema/COPD ([place])   . GERD (gastroesophageal reflux disease)    denies  . History of blood transfusion   . History of glaucoma   . History of kidney stones   . History of pancreatitis    2008--  mercaptopurine  . History of small bowel obstruction    12-03-2010  due to crohn's ileitis  . Hypertension    no longer on medications  . Hypothyroidism, postsurgical    multinodule w/ hurthle cells  . Osteoporosis   . PAF (paroxysmal atrial fibrillation) ([facility])   . Perianal Crohn's disease ([place])   . Peripheral vascular disease ([place])    blood clot behind knee left leg  . Polyarthralgia   . Pulmonary embolism ([place])   . Seizures ([place])    03/2017  . Wears partial dentures    upper    Patient Active Problem List   Diagnosis Date Noted  . Acute respiratory failure ([place]) 02/29/2020  . Pneumothorax   . Lung mass 02/18/2020  . Palliative care by specialist   . DNR (do not resuscitate)   . Metastasis to brain ([place]) 02/16/2020  . Brain lesion   . GERD (  gastroesophageal reflux disease)   . Headache   . Acute GI bleeding 01/30/2020  . Acute blood loss anemia 01/30/2020  . Supraventricular tachycardia ([place]) 12/27/2019  . Arrhythmia 11/19/2019  . Elevated troponin 11/19/2019  . Epigastric abdominal pain 11/18/2019  . Malignant neoplasm of overlapping sites of left lung ([place]) 11/17/2019  . Metastatic lung carcinoma, left ([place]) 11/15/2019  . Goals of care, counseling/discussion 11/15/2019  . Pneumonia of right lower lobe due to infectious organism 09/27/2019  . History of COVID-19 04/13/2019  . Acute on chronic respiratory failure with hypoxia ([place]) 04/13/2019  . Chronic anticoagulation 04/13/2019  . Medication management 02/22/2019  . Chronic respiratory failure with hypoxia ([place]) 09/23/2018    . Hx of Pulmonary embolism ([place]) 05/28/2018  . COPD with acute exacerbation ([place]) 03/31/2018  . Acute sinusitis 03/31/2018  . Crohn's disease of colon with complication ([place])   . Aortic insufficiency 07/30/2016  . Atherosclerosis of aorta ([place]) 07/30/2016  . Pressure injury of skin 07/14/2016  . Gastroenteritis, acute 07/12/2016  . COPD (chronic obstructive pulmonary disease) ([place]) 10/12/2015  . Malnutrition of moderate degree ([place]) 09/13/2014  . history of Left leg DVT 09/10/2014  . Hypotension 09/10/2014  . Hypoglycemia 09/10/2014  . Ischiorectal abscess 09/09/2014  . Increased anion gap metabolic acidosis   . Generalized abdominal pain   . Chronic diastolic CHF (congestive heart failure) ([place])   . Hypertensive heart disease   . Secondary hyperparathyroidism ([place])   . Other specified hypothyroidism   . Right shoulder pain   . Thrombocytopenia ([place])   . Severe protein-calorie malnutrition ([place])   . Thyroid activity decreased   . Emphysema of lung ([place])   . Nephrolithiasis 03/29/2014  . Spinal stenosis of lumbar region 11/01/2013  . Bilateral leg pain 09/28/2013  . Emphysema/COPD ([place])   . Shortness of breath 09/20/2013  . Protein-calorie malnutrition, severe ([place]) 05/14/2013  . Salmonella enteritidis 06/11/2012  . Hypocalcemia 06/10/2012  . Hypothyroidism 06/10/2012  . Absolute anemia 06/09/2012  . Hypomagnesemia 06/09/2012  . ESRD on dialysis ([place]) 06/09/2012  . Dehydration 06/07/2012  . Crohn's disease without complication ([place]) 16/05/930  . Metabolic acidosis 35/57/3220  . Hypokalemia 06/07/2012  . Nausea vomiting and diarrhea 06/07/2012  . UTI (lower urinary tract infection) 06/07/2012    Past Surgical History:  Procedure Laterality Date  . ABDOMINAL HYSTERECTOMY  1990   and  Appendectomy  . AV FISTULA PLACEMENT Left 12/08/2017   Procedure: INSERTION OF ARTERIOVENOUS (AV) GRAFT WITH ARTEGRAFT TO LEFT UPPER ARM;  Surgeon: Conrad Church Hill, MD;  Location: [facility];   Service: Vascular;  Laterality: Left;  . BASCILIC VEIN TRANSPOSITION Left 01/09/2017   Procedure: LEFT 1ST STAGE BRACHIAL VEIN TRANSPOSITION;  Surgeon: Conrad Herreid, MD;  Location: [facility];  Service: Vascular;  Laterality: Left;  . BASCILIC VEIN TRANSPOSITION Left 06/16/2017   Procedure: Second Stage BASILIC VEIN TRANSPOSITION  LEFT ARM;  Surgeon: Conrad Blacklake, MD;  Location: [facility];  Service: Vascular;  Laterality: Left;  . BIOPSY  03/09/2018   Procedure: BIOPSY;  Surgeon: Wilford Corner, MD;  Location: [facility];  Service: Endoscopy;;  . BIOPSY  01/31/2020   Procedure: BIOPSY;  Surgeon: Otis Brace, MD;  Location: [facility];  Service: Gastroenterology;;  . BIOPSY  02/02/2020   Procedure: BIOPSY;  Surgeon: Otis Brace, MD;  Location: [facility];  Service: Gastroenterology;;  . CHOLECYSTECTOMY    . COLON RESECTION  x3 --  [place] x2/   RIGHT COLECTOMY  1989  . COLONOSCOPY WITH PROPOFOL N/A 09/25/2014   Procedure: COLONOSCOPY WITH PROPOFOL;  Surgeon: Garlan Fair, MD;  Location: [facility];  Service: Endoscopy;  Laterality: N/A;  . COLONOSCOPY WITH PROPOFOL N/A 03/09/2018   Procedure: COLONOSCOPY WITH PROPOFOL;  Surgeon: Wilford Corner, MD;  Location: [facility];  Service: Endoscopy;  Laterality: N/A;  . COLONOSCOPY WITH PROPOFOL N/A 02/02/2020   Procedure: COLONOSCOPY WITH PROPOFOL;  Surgeon: Otis Brace, MD;  Location: [facility];  Service: Gastroenterology;  Laterality: N/A;  . CYSTOSCOPY W/ URETERAL STENT PLACEMENT Right 11/19/2016   Procedure: CYSTOSCOPY WITH RIGHT RETROGRADE PYELOGRAM/ URETEROSCOPY WITH LASER AND RIGHT URETERAL STENT PLACEMENT;  Surgeon: Ardis Hughs, MD;  Location[facility];  Service: Urology;  Laterality: Right;  . ESOPHAGOGASTRODUODENOSCOPY  03/04/2012   Procedure: ESOPHAGOGASTRODUODENOSCOPY (EGD);  Surgeon: Garlan Fair, MD;  Location: [facility];  Service: Endoscopy;  Laterality: N/A;  .  ESOPHAGOGASTRODUODENOSCOPY (EGD) WITH PROPOFOL N/A 01/31/2020   Procedure: ESOPHAGOGASTRODUODENOSCOPY (EGD) WITH PROPOFOL;  Surgeon: Otis Brace, MD;  [facility]: [facility];  Service: Gastroenterology;  Laterality: N/A;  . EXAMINATION UNDER ANESTHESIA N/A 09/12/2014   Procedure: EXAM UNDER ANESTHESIA;  Surgeon: Rolm Bookbinder, MD;  Location: [facility];  Service: General;  Laterality: N/A;  . EYE SURGERY     laser  . FLEXIBLE SIGMOIDOSCOPY  03/04/2012   Procedure: FLEXIBLE SIGMOIDOSCOPY;  Surgeon: Garlan Fair, MD;  [facility];  Service: Endoscopy;  Laterality: N/A;  . GLAUCOMA SURGERY Bilateral   . HOLMIUM LASER APPLICATION Right 1/61/0960   Procedure: HOLMIUM LASER APPLICATION;  Surgeon: Ardis Hughs, MD;  [facility];  Service: Urology;  Laterality: Right;  . INCISION AND DRAINAGE PERIRECTAL ABSCESS N/A 09/12/2014   Procedure: IRRIGATION AND DEBRIDEMENT PERIRECTAL ABSCESS;  Surgeon: Rolm Bookbinder, MD;  Location: [facility];  Service: General;  Laterality: N/A;  . IR FLUORO GUIDE CV LINE RIGHT  03/20/2017  . IR US GUIDE VASC ACCESS RIGHT  03/20/2017  . NEGATIVE SLEEP STUDY  2008  . PLACEMENT OF SETON N/A 12/01/2014   Procedure: PLACEMENT OF SETON;  Surgeon: [name], MD;  Location: [facility];  Service: General;  Laterality: N/A;  . POLYPECTOMY  03/09/2018   Procedure: POLYPECTOMY;  Surgeon: Wilford Corner, MD;  [facility];  Service: Endoscopy;;  . POLYPECTOMY  02/02/2020   Procedure: POLYPECTOMY;  Surgeon: Otis Brace, MD;  Location: [facility];  Service: Gastroenterology;;  . RECTAL EXAM UNDER ANESTHESIA N/A 12/01/2014   Procedure: RECTAL EXAM UNDER ANESTHESIA;  Surgeon: [name], MD;  Location: [facility];  Service: General;  Laterality: N/A;  . TOTAL THYROIDECTOMY  10-13-2003  . TRANSTHORACIC ECHOCARDIOGRAM  07-11-2013   mild LVH,  ef 55%,  moderate AR,  mild MR and TR,  trivial PR  . TUBAL  LIGATION       OB History   No obstetric history on file.     Family History  Problem Relation Age of Onset  . Hypertension Father   . Heart disease Father   . Heart attack Father   . Diverticulitis Mother   . COPD Mother   . Hypertension Mother   . Heart disease Mother   . Heart attack Mother   . Heart attack Brother   . Colon cancer Brother   . Diabetes Brother   . Thyroid disease Brother     Social History   Tobacco Use  . Smoking status: Former Smoker    Packs/day: 1.00    Years: 35.00  Pack years: 35.00    Types: Cigarettes    Start date: 02/22/1967    Quit date: 11/27/2017    Years since quitting: 2.2  . Smokeless tobacco: Never Used  Vaping Use  . Vaping Use: Never used  Substance Use Topics  . Alcohol use: No  . Drug use: No    Home Medications Prior to Admission medications   Medication Sig Start Date End Date Taking? Authorizing Provider  acetaminophen (TYLENOL) 500 MG tablet Take 1,000 mg by mouth every 6 (six) hours as needed for mild pain.    Yes [provider]  Adalimumab (HUMIRA) 40 MG/0.8ML PSKT Inject 40 mg into the skin every 14 (fourteen) days.    Yes [provider]  albuterol (VENTOLIN HFA) 108 (90 Base) MCG/ACT inhaler INHALE 2 PUFFS BY MOUTH EVERY 6 HOURS IF NEEDED FOR WHEEZING OR SHORTNESS OF BREATH Patient taking differently: Inhale 2 puffs into the lungs every 6 (six) hours as needed for wheezing or shortness of breath. INHALE 2 PUFFS BY MOUTH EVERY 6 HOURS IF NEEDED FOR WHEEZING OR SHORTNESS OF BREATH 05/31/19  Yes Chesley Mires, MD  calcitRIOL (ROCALTROL) 0.25 MCG capsule Take 1 capsule (0.25 mcg total) by mouth every Monday, Wednesday, and Friday with hemodialysis. 02/03/20  Yes Regalado, Belkys A, MD  calcium acetate (PHOSLO) 667 MG capsule Take 1 capsule (667 mg total) by mouth 3 (three) times daily with meals. 02/03/20  Yes Regalado, Belkys A, MD  calcium-vitamin D (OSCAL WITH D) 500-200 MG-UNIT TABS tablet Take 1 tablet  by mouth as needed (with dialysis). 02/03/20  Yes Regalado, Belkys A, MD  cyanocobalamin (,VITAMIN B-12,) 1000 MCG/ML injection Inject 1,000 mcg into the muscle every 30 (thirty) days.    Yes [provider]  dexamethasone (DECADRON) 4 MG tablet Take 1 tablet (4 mg total) by mouth 3 (three) times daily. 02/21/20  Yes Regalado, Belkys A, MD  ELIQUIS 5 MG TABS tablet Take 2.5 mg by mouth 2 (two) times daily. Takes 1/2 tablet (2.25m) bid 08/23/18  Yes [provider]  fluticasone (FLONASE) 50 MCG/ACT nasal spray Place 1 spray into both nostrils daily as needed for allergies.  03/19/19  Yes [provider]  Fluticasone-Umeclidin-Vilant (TRELEGY ELLIPTA) 100-62.5-25 MCG/INH AEPB Inhale 1 puff into the lungs daily. 01/17/20  Yes WMartyn Ehrich NP  ipratropium-albuterol (DUONEB) 0.5-2.5 (3) MG/3ML SOLN Take 3 mLs by nebulization every 6 (six) hours as needed. Patient taking differently: Take 3 mLs by nebulization every 6 (six) hours as needed (sob/wheezing).  12/30/18  Yes NFenton Foy NP  levothyroxine (SYNTHROID, LEVOTHROID) 125 MCG tablet Take 125 mcg by mouth daily before breakfast.    Yes [provider]  lidocaine-prilocaine (EMLA) cream Apply 1 application topically every Monday, Wednesday, and Friday.  11/29/18  Yes [provider]  LORazepam (ATIVAN) 0.5 MG tablet Take 2 tablets (1 mg total) by mouth as needed for anxiety (30 minutes prior to Radiation treatment(s)). 02/23/20  Yes Bruning, Ashlyn, PA-C  metoprolol tartrate (LOPRESSOR) 25 MG tablet Take 25 mg by mouth 2 (two) times daily. 02/10/20  Yes [provider]  midodrine (PROAMATINE) 10 MG tablet Take 1 tablet (10 mg total) by mouth 3 (three) times daily with meals. 01/24/20  Yes Nahser, PWonda Cheng MD  nitroGLYCERIN (NITROSTAT) 0.4 MG SL tablet Place 1 tablet (0.4 mg total) under the tongue every 5 (five) minutes as needed for chest pain. 05/03/19 02/29/20 Yes Nahser, PWonda Cheng MD  ondansetron  (ZOFRAN ODT) 4 MG disintegrating  tablet Take 1 tablet (4 mg total) by mouth every 8 (eight) hours as needed for nausea or vomiting. 02/03/20  Yes Regalado, Belkys A, MD  oxyCODONE (OXY IR/ROXICODONE) 5 MG immediate release tablet Take 1 tablet (5 mg total) by mouth every 4 (four) hours as needed for moderate pain. 02/21/20  Yes Regalado, Belkys A, MD  OXYGEN Inhale 5 L into the lungs See admin instructions. Use every night and during the day as needed for shortness of breath    Yes [provider]  sorbitol 70 % SOLN Take 30 mLs by mouth daily as needed for moderate constipation. 02/21/20  Yes Regalado, Belkys A, MD  diltiazem (CARDIZEM) 30 MG tablet Take 1 tablet (30 mg total) by mouth every 8 (eight) hours. Patient not taking: Reported on 02/29/2020 01/24/20   Nahser, Wonda Cheng, MD  pantoprazole (PROTONIX) 40 MG tablet Take 1 tablet (40 mg total) by mouth 2 (two) times daily. 02/03/20 03/04/20  Regalado, Cassie Freer, MD  Respiratory Therapy Supplies (NEBULIZER) DEVI 1 Device by Does not apply route as needed. 12/07/18   Fenton Foy, NP  sorbitol 70 % solution Take 15 mLs by mouth daily as needed. 02/21/20   Regalado, Jerald Kief A, MD    Allergies    Mercaptopurine and Remicade [infliximab]  Review of Systems   Review of Systems  Respiratory: Positive for shortness of breath.   All other systems reviewed and are negative.   Physical Exam Updated Vital Signs BP 140/72   Pulse (!) 118   Temp 98.6 F (37 C) (Oral)   Resp 14   SpO2 100%   Physical Exam Vitals and nursing note reviewed.  Constitutional:      General: She is in acute distress.     Appearance: Normal appearance. She is well-developed. She is ill-appearing.  HENT:     Head: Normocephalic and atraumatic.  Cardiovascular:     Rate and Rhythm: Regular rhythm. Tachycardia present.     Heart sounds: No murmur heard.   Pulmonary:     Effort: Respiratory distress present.     Comments: Tachypnea. Decreased air movement  bilaterally. Abdominal:     Palpations: Abdomen is soft.     Tenderness: There is no abdominal tenderness. There is no guarding or rebound.  Musculoskeletal:        General: Swelling present. No tenderness.  Skin:    General: Skin is warm and dry.  Neurological:     Comments: Oriented to person. Disoriented to time and recent events.  Psychiatric:        Behavior: Behavior normal.     ED Results / Procedures / Treatments   Labs (all labs ordered are listed, but only abnormal results are displayed) Labs Reviewed  COMPREHENSIVE METABOLIC PANEL - Abnormal; Notable for the following components:      Result Value   Potassium 5.2 (*)    CO2 21 (*)    Glucose, Bld 195 (*)    BUN 78 (*)    Creatinine, Ser 7.09 (*)    Calcium 8.1 (*)    Total Protein 5.6 (*)    Albumin 3.0 (*)    GFR calc non Af Amer 5 (*)    Anion gap 16 (*)    All other components within normal limits  BRAIN NATRIURETIC PEPTIDE - Abnormal; Notable for the following components:   B Natriuretic Peptide 669.3 (*)    All other components within normal limits  CBC WITH DIFFERENTIAL/PLATELET - Abnormal; Notable for the  following components:   WBC 13.7 (*)    RBC 2.32 (*)    Hemoglobin 6.9 (*)    HCT 22.3 (*)    RDW 19.0 (*)    Platelets 132 (*)    Neutro Abs 12.6 (*)    Lymphs Abs 0.3 (*)    Abs Immature Granulocytes 0.21 (*)    All other components within normal limits  I-STAT CHEM 8, ED - Abnormal; Notable for the following components:   BUN 70 (*)    Creatinine, Ser 6.80 (*)    Glucose, Bld 143 (*)    Calcium, Ion 0.84 (*)    TCO2 19 (*)    Hemoglobin 6.5 (*)    HCT 19.0 (*)    All other components within normal limits  I-STAT VENOUS BLOOD GAS, ED - Abnormal; Notable for the following components:   pH, Ven 7.507 (*)    pCO2, Ven 23.4 (*)    pO2, Ven 174.0 (*)    Bicarbonate 18.5 (*)    TCO2 19 (*)    Acid-base deficit 4.0 (*)    Calcium, Ion 0.82 (*)    HCT 17.0 (*)    Hemoglobin 5.8 (*)    All  other components within normal limits  TROPONIN I (HIGH SENSITIVITY) - Abnormal; Notable for the following components:   Troponin I (High Sensitivity) 63 (*)    All other components within normal limits  RESPIRATORY PANEL BY RT PCR (FLU A&B, COVID)  PROTIME-INR  RENAL FUNCTION PANEL  CBC  POC OCCULT BLOOD, ED  TYPE AND SCREEN  PREPARE RBC (CROSSMATCH)  TROPONIN I (HIGH SENSITIVITY)    EKG None  Radiology DG Chest 2 View  Result Date: 02/28/2020 CLINICAL DATA:  Dyspnea, productive cough, pre-bronchoscopy EXAM: CHEST - 2 VIEW COMPARISON:  01/30/2020 chest radiograph. FINDINGS: Vascular stent overlies the left axilla. Stable cardiomediastinal silhouette with top-normal heart size. No pneumothorax. No pleural effusion. Left perihilar 2.7 cm nodule is unchanged. No overt pulmonary edema. IMPRESSION: Stable left perihilar 2.7 cm nodule. No acute cardiopulmonary disease. Electronically Signed   By: Ilona Sorrel M.D.   On: 02/28/2020 09:05   CT CHEST WO CONTRAST  Result Date: 02/29/2020 CLINICAL DATA:  Pneumothorax EXAM: CT CHEST WITHOUT CONTRAST TECHNIQUE: Multidetector CT imaging of the chest was performed following the standard protocol without IV contrast. COMPARISON:  Radiograph same day FINDINGS: Cardiovascular: Aortic atherosclerosis is noted. There are coronary artery calcifications present. The heart size is normal. There is no pericardial thickening or effusion. A left axillary probable subclavian stent is seen. Mediastinum/Nodes: There are no enlarged mediastinal, hilar or axillary lymph nodes. The thyroid gland, trachea and esophagus demonstrate no significant findings. Lungs/Pleura: There is a small to moderate left-sided pneumothorax. A anterior left-sided pigtail catheter is seen with the tip projecting at the left lung base there is a surrounding area of consolidation/contusion around the pleural catheter at the base. Again noted is an anterior left upper lobe 3.5 cm pulmonary mass as  on the prior exam. There are 2 stable spiculated nodular opacity seen within the left upper lobe the largest measuring 7 mm, series 6, image 65. A stable 6 mm pulmonary nodule seen in the posterior right upper lung. Extensive centrilobular emphysematous changes are seen. No midline shift is noted. Upper abdomen: Bilateral renal atrophy with scattered calcifications are seen. Musculoskeletal/Chest wall: There is no chest wall mass or suspicious osseous finding. No acute osseous abnormality small amount of subcutaneous emphysema seen along the anterior chest wall. IMPRESSION: 1.  Small to moderate left-sided pneumothorax with a anterior left-sided pigtail catheter. The tip projecting at the lung base with surrounding pulmonary contusion/atelectasis. 2. Stable stable 3.5 cm pulmonary mass in the anterior left upper lobe. 3. Stable bilateral subcentimeter pulmonary nodules. 4.  Emphysema (ICD10-J43.9). 5.  Aortic Atherosclerosis (ICD10-I70.0). Electronically Signed   By: Prudencio Pair M.D.   On: 02/29/2020 19:14   MR BRAIN WO CONTRAST  Result Date: 02/29/2020 CLINICAL DATA:  CNS staging. Repeat due to excessively motion degraded postcontrast imaging to be used for treatment purposes EXAM: MRI HEAD WITHOUT CONTRAST TECHNIQUE: Multiplanar, multiecho pulse sequences of the brain and surrounding structures were obtained without intravenous contrast. COMPARISON:  02/20/2020 FINDINGS: Very motion degraded study, even more than the inadequate prior. The patient was sedated and was sleeping during much exam, which accentuated the motion. Even when aroused, motion with limiting factor. The technologist used maximal head stabilizers to no benefit. Given the degree of motion and the patient's end-stage renal disease status, additional gadolinium exposure was deemed to be of no justified benefit. Known metastatic disease with nonprogressive scattered areas of vasogenic edema in the left frontal, left frontal parietal, right  parietal, and right anterior temporal regions. No acute infarct, hydrocephalus, or acute hemorrhage. IMPRESSION: Motion degraded study, even worse than prior, such that the study was truncated before contrast administration. The patient's sedatives paradoxically diminished her ability to remain still. Electronically Signed   By: Monte Fantasia M.D.   On: 02/29/2020 09:55   DG Chest Portable 1 View  Result Date: 02/29/2020 CLINICAL DATA:  Left chest tube placement. EXAM: PORTABLE CHEST 1 VIEW COMPARISON:  Radiograph earlier today. FINDINGS: Placement of left-sided chest tube with tip directed towards the base. Decreased size of left pneumothorax with moderate residual at the apex, pleural line visualized under the posterior fourth rib. Emphysema and left perihilar nodule again seen heart is normal in size. Vascular stent in the left arm. Bones are under mineralized. IMPRESSION: 1. Placement of left-sided chest tube with decreased size of left pneumothorax, moderate residual at the apex. 2. Emphysema and left perihilar nodule, unchanged. Electronically Signed   By: Keith Rake M.D.   On: 02/29/2020 18:11   DG Chest Port 1 View  Addendum Date: 02/29/2020   ADDENDUM REPORT: 02/29/2020 16:41 ADDENDUM: These results were called by telephone at the time of interpretation on 02/29/2020 at 4:40 pm to provider [facility] , who verbally acknowledged these results. Electronically Signed   By: Fidela Salisbury MD   On: 02/29/2020 16:41   Result Date: 02/29/2020 CLINICAL DATA:  Dyspnea EXAM: PORTABLE CHEST 1 VIEW COMPARISON:  02/28/2020 FINDINGS: Large left pneumothorax is present. There is hyperexpansion of the left hemithorax in keeping with changes of at least mild tension physiology. Underlying COPD again noted with hyperexpansion of the lungs at baseline. Previously noted left mid lung zone mass is not well visualized on this examination. No pneumothorax on the right. No pleural effusion. Cardiac size is  within normal limits. Pulmonary vascularity is normal. Surgical clips are seen at the neck base in keeping with changes of probable prior thyroidectomy. Vascular stent noted within the central left upper extremity no acute bone abnormality. IMPRESSION: Large left pneumothorax, new since prior examination, with some degree of tension physiology. Known left mid lung zone mass not well visualized. COPD Electronically Signed: By: Fidela Salisbury MD On: 02/29/2020 16:30   DG CHEST PORT 1 VIEW  Result Date: 02/28/2020 CLINICAL DATA:  Status post bronchoscopy EXAM: PORTABLE CHEST 1  VIEW COMPARISON:  02/28/2020 FINDINGS: Cardiac shadow is stable. Left-sided mid chest mass is again identified and stable. No post bronchoscopy pneumothorax is seen. Vascular stent on the left is noted. The lungs are otherwise clear. IMPRESSION: No evidence of post bronchoscopy pneumothorax. Stable left mid chest mass. Electronically Signed   By: Inez Catalina M.D.   On: 02/28/2020 17:40   DG C-ARM BRONCHOSCOPY  Result Date: 02/28/2020 C-ARM BRONCHOSCOPY: Fluoroscopy was utilized by the requesting physician.  No radiographic interpretation.    Procedures Procedures (including critical care time) CRITICAL CARE Performed by: Quintella Reichert   Total critical care time: 40 minutes  Critical care time was exclusive of separately billable procedures and treating other patients.  Critical care was necessary to treat or prevent imminent or life-threatening deterioration.  Critical care was time spent personally by me on the following activities: development of treatment plan with patient and/or surrogate as well as nursing, discussions with consultants, evaluation of patient's response to treatment, examination of patient, obtaining history from patient or surrogate, ordering and performing treatments and interventions, ordering and review of laboratory studies, ordering and review of radiographic studies, pulse oximetry and  re-evaluation of patient's condition.  Medications Ordered in ED Medications  sodium chloride flush (NS) 0.9 % injection 10 mL (10 mLs Intracatheter Not Given 02/29/20 1932)  oxyCODONE (Oxy IR/ROXICODONE) immediate release tablet 5 mg (has no administration in time range)  lidocaine (LIDODERM) 5 % 1 patch (has no administration in time range)  0.9 %  sodium chloride infusion (Manually program via Guardrails IV Fluids) (has no administration in time range)  Chlorhexidine Gluconate Cloth 2 % PADS 6 each (has no administration in time range)  metoprolol tartrate (LOPRESSOR) tablet 25 mg (has no administration in time range)  nitroGLYCERIN (NITROSTAT) SL tablet 0.4 mg (has no administration in time range)  dexamethasone (DECADRON) tablet 4 mg (has no administration in time range)  levothyroxine (SYNTHROID) tablet 125 mcg (has no administration in time range)  calcium acetate (PHOSLO) capsule 667 mg (has no administration in time range)  calcitRIOL (ROCALTROL) capsule 0.25 mcg (has no administration in time range)  ondansetron (ZOFRAN-ODT) disintegrating tablet 4 mg (has no administration in time range)  pantoprazole (PROTONIX) EC tablet 40 mg (has no administration in time range)  calcium-vitamin D (OSCAL WITH D) 500-200 MG-UNIT per tablet 1 tablet (has no administration in time range)  albuterol (VENTOLIN HFA) 108 (90 Base) MCG/ACT inhaler 2 puff (has no administration in time range)  Fluticasone-Umeclidin-Vilant 100-62.5-25 MCG/INH AEPB 1 puff (has no administration in time range)  ipratropium-albuterol (DUONEB) 0.5-2.5 (3) MG/3ML nebulizer solution 3 mL (has no administration in time range)  sodium chloride flush (NS) 0.9 % injection 3 mL (has no administration in time range)  apixaban (ELIQUIS) tablet 2.5 mg (has no administration in time range)  albuterol (PROVENTIL,VENTOLIN) solution continuous neb (10 mg/hr Nebulization Given 02/29/20 1823)  ipratropium (ATROVENT) nebulizer solution 0.5 mg  (0.5 mg Nebulization Given 02/29/20 1822)  lidocaine-EPINEPHrine (XYLOCAINE W/EPI) 1 %-1:100000 (with pres) injection 10 mL (10 mLs Infiltration Given 02/29/20 1715)    ED Course  I have reviewed the triage vital signs and the nursing notes.  Pertinent labs & imaging results that were available during my care of the patient were reviewed by me and considered in my medical decision making (see chart for details).    MDM Rules/Calculators/A&P  Patient with ESR D on hemodialysis here for evaluation of progressive shortness of breath. She did have a bronchoscopy performed yesterday for lung mass. She was unable to continue her dialysis session due to shortness of breath today. On examination she is ill appearing with tachycardia, tachypnea and decreased air movement bilaterally. Imaging is significant for acute left sided pneumothorax. Discussed with patient and husband findings of studies.  Discussed case with critical care, they will see the patient and place chest tube and follow. Labs significant for recurrent progressive anemia, no evidence of acute bleeding. Will type and screen. Given her miss dialysis session today will not give blood products at this time. Medicine consulted for admission for ongoing treatment of symptomatic anemia, post procedural pneumothorax. Final Clinical Impression(s) / ED Diagnoses Final diagnoses:  Postprocedural pneumothorax  ESRD (end stage renal disease) on dialysis [facility], [place])  Shortness of breath    Rx / DC Orders ED Discharge Orders    None       Quintella Reichert, MD 02/29/20 1949

## 2020-02-29 NOTE — ED Triage Notes (Signed)
Pt to ED via EMS with complaints of SOB. Pt is coming from dialysis center and was unable to complete her session due to the SOB. Pt reports feeling dizzy, reports chest pain that started earlier this morning but has eased off. EMS reported pt to be in Afib during xport. Pt has hx of Afib/ Pt is chronically on 4 L of O2 at home, but with EMS she was placed on a simple mask and sats improved to 98 %.  Pt is AOx4

## 2020-02-29 NOTE — H&P (Signed)
History and Physical   Natasha [name] WSF:[id number] DOB: 01/23/1946 DOA: 02/29/2020  PCP: [name], MD   Patient coming from: Dialysis center via EMS  Chief Complaint: SOB  HPI: Natasha [name] is a 74 y.o. female with medical history significant of stage IV lung cancer, seizures, PVD, paroxysmal A. 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 She has chronic SOB that has been worse for a bout a month but became significantly worse in the last day and continued worsen. Symptoms are worse with lying flat and she does not notice anything that alleviates her symptoms.  She denies fever chills cough chest pain or abdominal pain.  Patient underwent an elective bronchoscopy yesterday with Dr. Valeta Harms for tissue sampling for her stage IV lung cancer.  Per note review she had previously not planned on pursuing treatment of her lung mass but this year has decided to pursue treatment.   ED Course: Patient noted to have mild hypertension with BP 017 systolic, present tachycardic to 115 and tachypneic to 30 this improved to tachycardic of 107 and tachypnea improved to a respiratory rate of 20 following placement of chest tube.  Lab findings showed BNP consistent with ESRD, hemoglobin 6.9, WBC 13.7, BNP 669, troponin 63, VBG with pH 7.5 and PCO2 23 consistent with respiratory alkalosis.  EKG showed sinus tachycardia. Chest x-ray showed large left pneumothorax. PCCM was consulted and placed a left anterior pigtail chest tube.  Hospitalist service asked to admit.  Review of Systems: As per HPI otherwise all other systems reviewed and are negative.  Past Medical History:  Diagnosis Date  . Anal fistula   . Anemia in chronic kidney disease (CKD)   . Anxiety   . Aortic insufficiency    Echo 3/18: mod conc LVH, EF 60-65, no RWMA, Gr 1 DD, mod AI, mild MR, normal RVSF,  Trivial TR  . Arthritis   . Borderline hypertension   . Bulging disc    L3-L4  . Cancer ([place])    Brain and spinal cord mets.  . CHF (congestive heart failure) ([place])   . Chronic diarrhea    due to crohn's  . CKD (chronic kidney disease), stage IV (HCC)    MWF- [facility] [place] [facility]  . Crohn's disease ([place])    chronic ileitis  . Dyspnea    with exertion  . Emphysema/COPD ([place])   . GERD (gastroesophageal reflux disease)    denies  . History of blood transfusion   . History of glaucoma   . History of kidney stones   . History of pancreatitis    2008--  mercaptopurine  . History of small bowel obstruction    12-03-2010  due to crohn's ileitis  . Hypertension    no longer on medications  . Hypothyroidism, postsurgical    multinodule w/ hurthle cells  . Osteoporosis   . PAF (paroxysmal atrial fibrillation) ([place])   . Perianal Crohn's disease ([place])   . Peripheral vascular disease ([place])    blood clot behind knee left leg  . Polyarthralgia   . Pulmonary embolism ([place])   . Seizures ([place])    03/2017  . Wears partial dentures    upper    Past Surgical History:  Procedure Laterality Date  . ABDOMINAL HYSTERECTOMY  1990   and  Appendectomy  . AV FISTULA PLACEMENT Left 12/08/2017   Procedure: INSERTION OF  ARTERIOVENOUS (AV) GRAFT WITH ARTEGRAFT TO LEFT UPPER ARM;  Surgeon: Conrad Brainard, MD;  Location: [place];  Service: Vascular;  Laterality: Left;  . BASCILIC VEIN TRANSPOSITION Left 01/09/2017   Procedure: LEFT 1ST STAGE BRACHIAL VEIN TRANSPOSITION;  Surgeon: Conrad Kinston, MD;  Location: [place];  Service: Vascular;  Laterality: Left;  . BASCILIC VEIN TRANSPOSITION Left 06/16/2017   Procedure: Second Stage BASILIC VEIN TRANSPOSITION  LEFT ARM;  Surgeon: Conrad Ramseur, MD;  Location: [place];  Service: Vascular;  Laterality: Left;  . BIOPSY  03/09/2018   Procedure: BIOPSY;  Surgeon: Wilford Corner, MD;  [facility];  Service: Endoscopy;;  . BIOPSY  01/31/2020   Procedure:  BIOPSY;  Surgeon: Otis Brace, MD;  Location: [place];  Service: Gastroenterology;;  . BIOPSY  02/02/2020   Procedure: BIOPSY;  Surgeon: Otis Brace, MD;  Location: [place];  Service: Gastroenterology;;  . CHOLECYSTECTOMY    . COLON RESECTION  x3 --  1978,  1987, 1989   ILEAL RESECTION x2/   [place]  . COLONOSCOPY WITH PROPOFOL N/A 09/25/2014   Procedure: COLONOSCOPY WITH PROPOFOL;  Surgeon: Garlan Fair, MD;  [facility];  Service: Endoscopy;  Laterality: N/A;  . COLONOSCOPY WITH PROPOFOL N/A 03/09/2018   Procedure: COLONOSCOPY WITH PROPOFOL;  Surgeon: Wilford Corner, MD;  [facility];  Service: Endoscopy;  Laterality: N/A;  . COLONOSCOPY WITH PROPOFOL N/A 02/02/2020   Procedure: COLONOSCOPY WITH PROPOFOL;  Surgeon: Otis Brace, MD;  Location: [place];  Service: Gastroenterology;  Laterality: N/A;  . CYSTOSCOPY W/ URETERAL STENT PLACEMENT Right 11/19/2016   Procedure: CYSTOSCOPY WITH RIGHT RETROGRADE PYELOGRAM/ URETEROSCOPY WITH LASER AND RIGHT URETERAL STENT PLACEMENT;  Surgeon: Ardis Hughs, MD;  [facility];  Service: Urology;  Laterality: Right;  . ESOPHAGOGASTRODUODENOSCOPY  03/04/2012   Procedure: ESOPHAGOGASTRODUODENOSCOPY (EGD);  Surgeon: Garlan Fair, MD;  [facility];  Service: Endoscopy;  Laterality: N/A;  . ESOPHAGOGASTRODUODENOSCOPY (EGD) WITH PROPOFOL N/A 01/31/2020   Procedure: ESOPHAGOGASTRODUODENOSCOPY (EGD) WITH PROPOFOL;  Surgeon: Otis Brace, MD;  [facility];  Service: Gastroenterology;  Laterality: N/A;  . EXAMINATION UNDER ANESTHESIA N/A 09/12/2014   Procedure: EXAM UNDER ANESTHESIA;  Surgeon: Rolm Bookbinder, MD;  Location: [place];  Service: General;  Laterality: N/A;  . EYE SURGERY     laser  . FLEXIBLE SIGMOIDOSCOPY  03/04/2012   Procedure: FLEXIBLE SIGMOIDOSCOPY;  Surgeon: Garlan Fair, MD;  [facility];  Service: Endoscopy;  Laterality: N/A;    . GLAUCOMA SURGERY Bilateral   . HOLMIUM LASER APPLICATION Right 5/32/9924   Procedure: HOLMIUM LASER APPLICATION;  Surgeon: Ardis Hughs, MD;  [facility];  Service: Urology;  Laterality: Right;  . INCISION AND DRAINAGE PERIRECTAL ABSCESS N/A 09/12/2014   Procedure: IRRIGATION AND DEBRIDEMENT PERIRECTAL ABSCESS;  Surgeon: Rolm Bookbinder, MD;  Location: [place];  Service: General;  Laterality: N/A;  . IR FLUORO GUIDE CV LINE RIGHT  03/20/2017  . IR US GUIDE VASC ACCESS RIGHT  03/20/2017  . NEGATIVE SLEEP STUDY  2008  . PLACEMENT OF SETON N/A 12/01/2014   Procedure: PLACEMENT OF SETON;  Surgeon: [name], MD;  [facility];  Service: General;  Laterality: N/A;  . POLYPECTOMY  03/09/2018   Procedure: POLYPECTOMY;  Surgeon: Wilford Corner, MD;  [facility];  Service: Endoscopy;;  . POLYPECTOMY  02/02/2020   Procedure: POLYPECTOMY;  Surgeon: Otis Brace, MD;  [facility];  Service: Gastroenterology;;  . RECTAL EXAM  UNDER ANESTHESIA N/A 12/01/2014   Procedure: RECTAL EXAM UNDER ANESTHESIA;  Surgeon: [name], MD;  Location[facility];  Service: General;  Laterality: N/A;  . TOTAL THYROIDECTOMY  10-13-2003  . TRANSTHORACIC ECHOCARDIOGRAM  07-11-2013   mild LVH,  ef 55%,  moderate AR,  mild MR and TR,  trivial PR  . TUBAL LIGATION      Social History  reports that she quit smoking about 2 years ago. Her smoking use included cigarettes. She started smoking about 53 years ago. She has a 35.00 pack-year smoking history. She has never used smokeless tobacco. She reports that she does not drink alcohol and does not use drugs.  Allergies  Allergen Reactions  . Mercaptopurine Other (See Comments)    Caused pancreatitis  . Remicade [Infliximab] Other (See Comments)    CAUSED JOINT PAIN    Family History  Problem Relation Age of Onset  . Hypertension Father   . Heart disease Father   . Heart attack Father    . Diverticulitis Mother   . COPD Mother   . Hypertension Mother   . Heart disease Mother   . Heart attack Mother   . Heart attack Brother   . Colon cancer Brother   . Diabetes Brother   . Thyroid disease Brother   Reviewed on admission  Prior to Admission medications   Medication Sig Start Date End Date Taking? Authorizing Provider  albuterol (VENTOLIN HFA) 108 (90 Base) MCG/ACT inhaler INHALE 2 PUFFS BY MOUTH EVERY 6 HOURS IF NEEDED FOR WHEEZING OR SHORTNESS OF BREATH Patient taking differently: Inhale 2 puffs into the lungs every 6 (six) hours as needed for wheezing or shortness of breath. INHALE 2 PUFFS BY MOUTH EVERY 6 HOURS IF NEEDED FOR WHEEZING OR SHORTNESS OF BREATH 05/31/19  Yes Chesley Mires, MD  calcitRIOL (ROCALTROL) 0.25 MCG capsule Take 1 capsule (0.25 mcg total) by mouth every Monday, Wednesday, and Friday with hemodialysis. 02/03/20  Yes Regalado, Belkys A, MD  calcium acetate (PHOSLO) 667 MG capsule Take 1 capsule (667 mg total) by mouth 3 (three) times daily with meals. 02/03/20  Yes Regalado, Belkys A, MD  cyanocobalamin (,VITAMIN B-12,) 1000 MCG/ML injection Inject 1,000 mcg into the muscle every 30 (thirty) days.    Yes [provider]  dexamethasone (DECADRON) 4 MG tablet Take 1 tablet (4 mg total) by mouth 3 (three) times daily. 02/21/20  Yes Regalado, Belkys A, MD  levothyroxine (SYNTHROID, LEVOTHROID) 125 MCG tablet Take 125 mcg by mouth daily before breakfast.    Yes [provider]  LORazepam (ATIVAN) 0.5 MG tablet Take 2 tablets (1 mg total) by mouth as needed for anxiety (30 minutes prior to Radiation treatment(s)). 02/23/20  Yes Bruning, Ashlyn, PA-C  acetaminophen (TYLENOL) 500 MG tablet Take 1,000 mg by mouth every 6 (six) hours as needed for mild pain.     [provider]  Adalimumab (HUMIRA) 40 MG/0.8ML PSKT Inject 40 mg into the skin every 14 (fourteen) days.     [provider]  calcium-vitamin D (OSCAL WITH D) 500-200 MG-UNIT  TABS tablet Take 1 tablet by mouth as needed (with dialysis). 02/03/20   Regalado, Belkys A, MD  diltiazem (CARDIZEM) 30 MG tablet Take 1 tablet (30 mg total) by mouth every 8 (eight) hours. 01/24/20   Nahser, Wonda Cheng, MD  ELIQUIS 5 MG TABS tablet Take 2.5 mg by mouth 2 (two) times daily. Takes 1/2 tablet (2.72m) bid 08/23/18   [provider]  fluticasone (  FLONASE) 50 MCG/ACT nasal spray Place 1 spray into both nostrils daily as needed for allergies.  03/19/19   [provider]  Fluticasone-Umeclidin-Vilant (TRELEGY ELLIPTA) 100-62.5-25 MCG/INH AEPB Inhale 1 puff into the lungs daily. 01/17/20   Martyn Ehrich, NP  ipratropium-albuterol (DUONEB) 0.5-2.5 (3) MG/3ML SOLN Take 3 mLs by nebulization every 6 (six) hours as needed. Patient taking differently: Take 3 mLs by nebulization every 6 (six) hours as needed (sob/wheezing).  12/30/18   Fenton Foy, NP  lidocaine-prilocaine (EMLA) cream Apply 1 application topically every Monday, Wednesday, and Friday.  11/29/18   [provider]  metoprolol tartrate (LOPRESSOR) 25 MG tablet Take 25 mg by mouth 2 (two) times daily. 02/10/20   [provider]  midodrine (PROAMATINE) 10 MG tablet Take 1 tablet (10 mg total) by mouth 3 (three) times daily with meals. 01/24/20   Nahser, Wonda Cheng, MD  nitroGLYCERIN (NITROSTAT) 0.4 MG SL tablet Place 1 tablet (0.4 mg total) under the tongue every 5 (five) minutes as needed for chest pain. 05/03/19 02/15/20  Nahser, Wonda Cheng, MD  ondansetron (ZOFRAN ODT) 4 MG disintegrating tablet Take 1 tablet (4 mg total) by mouth every 8 (eight) hours as needed for nausea or vomiting. 02/03/20   Regalado, Belkys A, MD  oxyCODONE (OXY IR/ROXICODONE) 5 MG immediate release tablet Take 1 tablet (5 mg total) by mouth every 4 (four) hours as needed for moderate pain. 02/21/20   Regalado, Belkys A, MD  OXYGEN Inhale 2-3 L into the lungs See admin instructions. Use every night and during the day as needed for  shortness of breath    [provider]  pantoprazole (PROTONIX) 40 MG tablet Take 1 tablet (40 mg total) by mouth 2 (two) times daily. 02/03/20 03/04/20  Regalado, Cassie Freer, MD  Respiratory Therapy Supplies (NEBULIZER) DEVI 1 Device by Does not apply route as needed. 12/07/18   Fenton Foy, NP  sorbitol 70 % SOLN Take 30 mLs by mouth daily as needed for moderate constipation. 02/21/20   Regalado, Belkys A, MD  sorbitol 70 % solution Take 15 mLs by mouth daily as needed. 02/21/20   Elmarie Shiley, MD    Physical Exam: Vitals:   02/29/20 1715 02/29/20 1730 02/29/20 1745 02/29/20 1800  BP: (!) 149/77 126/73 138/71 126/67  Pulse: (!) 105 (!) 110 (!) 112 (!) 106  Resp: 14 20 20 18   Temp:      TempSrc:      SpO2: 100% 100% 100% 100%    Constitutional: NAD, calm, comfortable Vitals:   02/29/20 1715 02/29/20 1730 02/29/20 1745 02/29/20 1800  BP: (!) 149/77 126/73 138/71 126/67  Pulse: (!) 105 (!) 110 (!) 112 (!) 106  Resp: 14 20 20 18   Temp:      TempSrc:      SpO2: 100% 100% 100% 100%   General: Chronically weak and ill appearing female Eyes: PERRL, lids normal, teary and mild discoloration of conjunctivae ENMT: Mucous membranes are moist.  Neck: normal, no masses Respiratory: Deminished bilaterally L>R, no wheezing, no crackles. Mild increased effort. Cardiovascular: Tachycardic, regular rhythm, no murmurs / rubs / gallops. 2+ LE edema. Abdomen: no tenderness, no masses palpated. Bowel sounds positive.  Musculoskeletal: No deformity noted. Normal muscle tone.  Skin: no rashes, lesions, ulcers. No induration Neurologic: Grossly intact. Sensation intact Psychiatric: Drowsy, normal judgment and insight.   Labs on Admission: I have personally reviewed following labs and imaging studies  CBC: Recent Labs  Lab 02/28/20 [id number]  02/29/20 1537 02/29/20 1604 02/29/20 1609  WBC 14.9* 13.7*  --   --   NEUTROABS  --  12.6*  --   --   HGB 7.5* 6.9* 6.5* 5.8*  HCT 25.1*  22.3* 19.0* 17.0*  MCV 100.4* 96.1  --   --   PLT 169 132*  --   --     Basic Metabolic Panel: Recent Labs  Lab 02/28/20 0939 02/28/20 1333 02/29/20 1537 02/29/20 1604 02/29/20 1609  NA 140  --  140 139 139  K 6.2* 3.0* 5.2* 4.4 4.5  CL 103  --  103 110  --   CO2 18*  --  21*  --   --   GLUCOSE 91  --  195* 143*  --   BUN 135*  --  78* 70*  --   CREATININE 11.08*  --  7.09* 6.80*  --   CALCIUM 9.1  --  8.1*  --   --     GFR: Estimated Creatinine Clearance: 6 mL/min (A) (by C-G formula based on SCr of 6.8 mg/dL (H)).  Liver Function Tests: Recent Labs  Lab 02/28/20 0939 02/29/20 1537  AST 17 17  ALT 17 17  ALKPHOS 51 58  BILITOT 0.5 0.7  PROT 5.8* 5.6*  ALBUMIN 3.1* 3.0*    Urine analysis:    Component Value Date/Time   COLORURINE YELLOW 11/18/2019 2138   APPEARANCEUR CLEAR 11/18/2019 2138   LABSPEC 1.019 11/18/2019 2138   PHURINE 6.0 11/18/2019 2138   GLUCOSEU 50 (A) 11/18/2019 2138   HGBUR MODERATE (A) 11/18/2019 2138   BILIRUBINUR NEGATIVE 11/18/2019 2138   [place] NEGATIVE 11/18/2019 2138   PROTEINUR 30 (A) 11/18/2019 2138   UROBILINOGEN 0.2 09/09/2014 0850   NITRITE NEGATIVE 11/18/2019 2138   LEUKOCYTESUR NEGATIVE 11/18/2019 2138    Radiological Exams on Admission: DG Chest 2 View  Result Date: 02/28/2020 CLINICAL DATA:  Dyspnea, productive cough, pre-bronchoscopy EXAM: CHEST - 2 VIEW COMPARISON:  01/30/2020 chest radiograph. FINDINGS: Vascular stent overlies the left axilla. Stable cardiomediastinal silhouette with top-normal heart size. No pneumothorax. No pleural effusion. Left perihilar 2.7 cm nodule is unchanged. No overt pulmonary edema. IMPRESSION: Stable left perihilar 2.7 cm nodule. No acute cardiopulmonary disease. Electronically Signed   By: Ilona Sorrel M.D.   On: 02/28/2020 09:05   MR BRAIN WO CONTRAST  Result Date: 02/29/2020 CLINICAL DATA:  CNS staging. Repeat due to excessively motion degraded postcontrast imaging to be used for  treatment purposes EXAM: MRI HEAD WITHOUT CONTRAST TECHNIQUE: Multiplanar, multiecho pulse sequences of the brain and surrounding structures were obtained without intravenous contrast. COMPARISON:  02/20/2020 FINDINGS: Very motion degraded study, even more than the inadequate prior. The patient was sedated and was sleeping during much exam, which accentuated the motion. Even when aroused, motion with limiting factor. The technologist used maximal head stabilizers to no benefit. Given the degree of motion and the patient's end-stage renal disease status, additional gadolinium exposure was deemed to be of no justified benefit. Known metastatic disease with nonprogressive scattered areas of vasogenic edema in the left frontal, left frontal parietal, right parietal, and right anterior temporal regions. No acute infarct, hydrocephalus, or acute hemorrhage. IMPRESSION: Motion degraded study, even worse than prior, such that the study was truncated before contrast administration. The patient's sedatives paradoxically diminished her ability to remain still. Electronically Signed   By: Monte Fantasia M.D.   On: 02/29/2020 09:55   DG Chest Portable 1 View  Result Date: 02/29/2020 CLINICAL DATA:  Left chest tube placement. EXAM: PORTABLE CHEST 1 VIEW COMPARISON:  Radiograph earlier today. FINDINGS: Placement of left-sided chest tube with tip directed towards the base. Decreased size of left pneumothorax with moderate residual at the apex, pleural line visualized under the posterior fourth rib. Emphysema and left perihilar nodule again seen heart is normal in size. Vascular stent in the left arm. Bones are under mineralized. IMPRESSION: 1. Placement of left-sided chest tube with decreased size of left pneumothorax, moderate residual at the apex. 2. Emphysema and left perihilar nodule, unchanged. Electronically Signed   By: Keith Rake M.D.   On: 02/29/2020 18:11   DG Chest Port 1 View  Addendum Date: 02/29/2020     ADDENDUM REPORT: 02/29/2020 16:41 ADDENDUM: These results were called by telephone at the time of interpretation on 02/29/2020 at 4:40 pm to provider [facility] , who verbally acknowledged these results. Electronically Signed   By: Fidela Salisbury MD   On: 02/29/2020 16:41   Result Date: 02/29/2020 CLINICAL DATA:  Dyspnea EXAM: PORTABLE CHEST 1 VIEW COMPARISON:  02/28/2020 FINDINGS: Large left pneumothorax is present. There is hyperexpansion of the left hemithorax in keeping with changes of at least mild tension physiology. Underlying COPD again noted with hyperexpansion of the lungs at baseline. Previously noted left mid lung zone mass is not well visualized on this examination. No pneumothorax on the right. No pleural effusion. Cardiac size is within normal limits. Pulmonary vascularity is normal. Surgical clips are seen at the neck base in keeping with changes of probable prior thyroidectomy. Vascular stent noted within the central left upper extremity no acute bone abnormality. IMPRESSION: Large left pneumothorax, new since prior examination, with some degree of tension physiology. Known left mid lung zone mass not well visualized. COPD Electronically Signed: By: Fidela Salisbury MD On: 02/29/2020 16:30   DG CHEST PORT 1 VIEW  Result Date: 02/28/2020 CLINICAL DATA:  Status post bronchoscopy EXAM: PORTABLE CHEST 1 VIEW COMPARISON:  02/28/2020 FINDINGS: Cardiac shadow is stable. Left-sided mid chest mass is again identified and stable. No post bronchoscopy pneumothorax is seen. Vascular stent on the left is noted. The lungs are otherwise clear. IMPRESSION: No evidence of post bronchoscopy pneumothorax. Stable left mid chest mass. Electronically Signed   By: Inez Catalina M.D.   On: 02/28/2020 17:40   DG C-ARM BRONCHOSCOPY  Result Date: 02/28/2020 C-ARM BRONCHOSCOPY: Fluoroscopy was utilized by the requesting physician.  No radiographic interpretation.   EKG: Independently reviewed. Sinus  Tachycardia.  Assessment/Plan Active Problems:   Absolute anemia  Left Pneumothorax Acute on Chronic Rspiratory Failure Stage IV lung cancer: S/P Bronchoscopy 10/5 Hx of DVT PE COPD Patient presented with acute on chronic shortness of breath found to have left pneumothorax following bronchoscopy yesterday.  PCCM saw patient in ED and placed left anterior pigtail catheter with improvement of new or pneumothorax on imaging.  Will continue treatments for her chronic COPD.  Eliquis was held for her bronchoscopy yesterday, will plan to resume tomorrow given Hemoccult was negative. - Appreciate PCCM recommendations - Admit to progressive - Duonebs PRN - Continue home Trelegy,and albuterol  - Conitune PRN Oxy IR for pain - Follow CXR - Resume Eliquis tomorrow  ESRD on HD MWF: BUN 78 creatinine 7K5.2.  Was unable to undergo her usual dialysis session today. - Nephrology Consulted for HD, plan for HD tomorrow - Continue Phoslo, Calcitriol  Anemia: Acute on chronic anemia, likely related to ESRD.  Hemoglobin 6.9 on admission, 1 unit PRBCs ordered by PCCM.  Will follow CBC. - Follow up post transfusion labs - Trend CBC  HTN: PAF: On Eliquis and metoprolol, reportedly not taking diltiazem.  Sinus tachycardia in ED CHF: Last echo on 10/29/2018, EF greater than 65% with some impaired relaxation, volume management via HD. - Continue home Metoprolol - Eliquis held for bronchoscopy, plan to restart tomorrow.     DVT prophylaxis  SCDs today, resume eliquis tomorrow Code Status:  Partial, No ACLS, Inutbation okay Family Communication:  Spoke with husband "Nicole Kindred" at bedside Disposition Plan:   Patient is from:  Home  Anticipated DC to:  Home pending eval Consults called:  PCCM, Ers[facility]ine Emery; Nephrology [facility]  Admission status:  Progressive  Severity of Illness: The appropriate patient status for this patient is INPATIENT. Inpatient status is judged to be reasonable and necessary in order to  provide the required intensity of service to ensure the patient's safety. The patient's presenting symptoms, physical exam findings, and initial radiographic and laboratory data in the context of their chronic comorbidities is felt to place them at high risk for further clinical deterioration. Furthermore, it is not anticipated that the patient will be medically stable for discharge from the hospital within 2 midnights of admission. The following factors support the patient status of inpatient.   " The patient's presenting symptoms include Tachycardia, Tachypnea, SOB. " The worrisome physical exam findings include Decreased breath sounds. " The initial radiographic and laboratory data are worrisome because of large pneumothorax. " The chronic co-morbidities include COPD, Lung Cancer, ESRD.   * I certify that at the point of admission it is my clinical judgment that the patient will require inpatient hospital care spanning beyond 2 midnights from the point of admission due to high intensity of service, high risk for further deterioration and high frequency of surveillance required.Marcelyn Bruins MD [facility]  How to contact the [facility] Attending or Consulting provider [place] or covering provider during after hours [place], for this patient?   1. Check the care team in [facility] and look for a) attending/consulting [facility] provider listed and b) the [facility] team listed 2. Log into [email or web address] and use [place]'s universal password to access. If you do not have the password, please contact the hospital operator. 3. Locate the [facility] provider you are looking for under [facility] and page to a number that you can be directly reached. 4. If you still have difficulty reaching the provider, please page the [facility] (Director on Call) for the Hospitalists listed on amion for assistance.  02/29/2020, 6:36 PM

## 2020-02-29 NOTE — ED Notes (Signed)
Informed Dr. Ralene Bathe of pt's venous blood gas and chem 8 results.

## 2020-02-29 NOTE — Consult Note (Signed)
NAME:  Natasha [name], MRN:  [id number], DOB:  Nov 09, 1945, LOS: 0 ADMISSION DATE:  02/29/2020, CONSULTATION DATE:  02/29/2020 REFERRING MD:  Dr. Ralene Bathe, CHIEF COMPLAINT:  Pneumothorax   Brief History   74yo female presented with progressive SOB during HD treatment. Of note patient underwent elective bronchoscopy for tissue sampling of stage IV lung cancer with Dr. Valeta Harms 10/5. On admission she is seen with large left pneumothorax requiring pigtail chest tube insertion in ED.   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 Patient reported the SOB began last night and has progressive worsened. The SOB is worse when lying flat. She denies any alleviating factors. She also denies any fever, chills, cough, chest pain, or abdominal pain.  Of note patient underwent elective bronchoscopy with Dr. Valeta Harms 10/5 for tissue sampling in the setting of stage IV lung cancer. Patient had originally opted not to treat lung mass but later decided to purse workup this year. Given significant history of COPD patient and clinical team deferred bronchoscopy in attempt to obtain tissue sampling from spinal lesion however this was non-diagnostic and this resulted in need of bronchoscopy.   On arrival patient was seen tachycardiac, tachypnic, and mildly hypertensive. Lab work significant for K 5.2, Creatinine 7.09, calcium 8.1, BNP 669.3, WBC 13.7, hgb 6.9, and hct 22.3. CXR revealed large left pneumothorax. 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 Known left mid lung zone mass not well visualized.   Micro Data:    Antimicrobials:     Interim history/subjective:  Sitting up in ED stretcher in no acute distress, She continues to report SOB but improved post chest tube insertion   Objective   Blood pressure (!) 149/77, pulse (!) 105, temperature 98.6 F (37 C), temperature source Oral, resp. rate 14, SpO2 100 %.       No intake or output data in the 24 hours ending 02/29/20 1733 There were no vitals filed for this visit.  Examination: General: Chronically ill appearing very thin elderly female lying on ED stretcher in NAD  HEENT: [place]/AT, MM pink/moist, PERRL,  Neuro: Alert and oriented x3, non-focal  CV: s1s2 regular rate and rhythm, no murmur, rubs, or gallops,  PULM:  Diminished left lung sounds, faint expiratory wheeze, no increased work of breathing GI: soft, bowel sounds active in all 4 quadrants, non-tender, non-distended Extremities: warm/dry, no edema  Skin: no rashes or lesions   Resolved Hospital Problem list     Assessment & Plan:  Large left pneumothorax  -Post elective bronchoscopy 10/5 for tissue sampling in the setting of IV lung cancer  Stage IV lung cancer Chronic hypoxic respiratory failure  -Baseline requires 3Lnc Continuously  Severe COPD with underlying emphysema  -GOLDstage III-IV;PFTs in November 2020 showed FEV1 0.57 (35%), ratio  37. P: Routine chest tube care  Obtain CT chest  Flush chest tube per protocol Continue home bronchodilators once medication reconciliation completed  Continue supplemental oxygen, SPO2 goal > 88 Mobilize as able  Adequate pain control  Repeat CXR in AM  Best practice:  Diet: Regular  Pain/Anxiety/Delirium protocol (if  indicated): PRns VAP protocol (if indicated): N/A DVT prophylaxis: Resume home eliquis  GI prophylaxis: PPI Glucose control: SSI Mobility: Up with assistance  Code Status: DNR Family Communication: Husband updated at bedside  Disposition: Floor   Labs   CBC: Recent Labs  Lab 02/28/20 0939 02/29/20 1537 02/29/20 1604 02/29/20 1609  WBC 14.9* 13.7*  --   --   NEUTROABS  --  12.6*  --   --   HGB 7.5* 6.9* 6.5* 5.8*  HCT 25.1* 22.3* 19.0* 17.0*  MCV 100.4* 96.1  --   --   PLT 169 132*  --   --     Basic Metabolic Panel: Recent Labs  Lab 02/28/20 0939 02/28/20 1333 02/29/20 1537 02/29/20 1604 02/29/20 1609  NA 140  --  140 139 139  K 6.2* 3.0* 5.2* 4.4 4.5  CL 103  --  103 110  --   CO2 18*  --  21*  --   --   GLUCOSE 91  --  195* 143*  --   BUN 135*  --  78* 70*  --   CREATININE 11.08*  --  7.09* 6.80*  --   CALCIUM 9.1  --  8.1*  --   --    GFR: Estimated Creatinine Clearance: 6 mL/min (A) (by C-G formula based on SCr of 6.8 mg/dL (H)). Recent Labs  Lab 02/28/20 0939 02/29/20 1537  WBC 14.9* 13.7*    Liver Function Tests: Recent Labs  Lab 02/28/20 0939 02/29/20 1537  AST 17 17  ALT 17 17  ALKPHOS 51 58  BILITOT 0.5 0.7  PROT 5.8* 5.6*  ALBUMIN 3.1* 3.0*   No results for input(s): LIPASE, AMYLASE in the last 168 hours. No results for input(s): AMMONIA in the last 168 hours.  ABG    Component Value Date/Time   PHART 7.350 04/15/2019 1436   PCO2ART 40.6 04/15/2019 1436   PO2ART 121 (H) 04/15/2019 1436   HCO3 18.5 (L) 02/29/2020 1609   TCO2 19 (L) 02/29/2020 1609   ACIDBASEDEF 4.0 (H) 02/29/2020 1609   O2SAT 100.0 02/29/2020 1609     Coagulation Profile: Recent Labs  Lab 02/28/20 0939 02/29/20 1644  INR 1.1 1.0    Cardiac Enzymes: No results for input(s): CKTOTAL, CKMB, CKMBINDEX, TROPONINI in the last 168 hours.  HbA1C: Hgb A1c MFr Bld  Date/Time Value Ref Range Status  11/18/2019 05:03 PM 5.0 4.8 - 5.6 % Final    Comment:     (NOTE) Pre diabetes:          5.7%-6.4%  Diabetes:              >6.4%  Glycemic control for   <7.0% adults with diabetes     CBG: No results for input(s): GLUCAP in the last 168 hours.  Review of Systems: Positive in bold   Gen: Denies fever, chills, weight change, fatigue, night sweats HEENT: Denies blurred vision, double vision, hearing loss, tinnitus, sinus congestion, rhinorrhea, sore throat, neck stiffness, dysphagia PULM: Denies shortness of breath, cough, sputum production, hemoptysis, wheezing CV: Denies chest pain, edema, orthopnea, paroxysmal nocturnal dyspnea, palpitations GI: Denies abdominal pain, nausea, vomiting, diarrhea, hematochezia, melena, constipation, change  in bowel habits GU: Denies dysuria, hematuria, polyuria, oliguria, urethral discharge Endocrine: Denies hot or cold intolerance, polyuria, polyphagia or appetite change Derm: Denies rash, dry skin, scaling or peeling skin change Heme: Denies easy bruising, bleeding, bleeding gums Neuro: Denies headache, numbness, weakness, slurred speech, loss of memory or consciousness  Past Medical History  She,  has a past medical history of Anal fistula, Anemia in chronic kidney disease (CKD), Anxiety, Aortic insufficiency, Arthritis, Borderline hypertension, Bulging disc, Cancer ([place]), CHF (congestive heart failure) ([place]), Chronic diarrhea, CKD (chronic kidney disease), stage IV ([place]), Crohn's disease ([place]), Dyspnea, Emphysema/COPD ([place]), GERD (gastroesophageal reflux disease), History of blood transfusion, History of glaucoma, History of kidney stones, History of pancreatitis, History of small bowel obstruction, Hypertension, Hypothyroidism, postsurgical, Osteoporosis, PAF (paroxysmal atrial fibrillation) ([place]), Perianal Crohn's disease ([place]), Peripheral vascular disease ([place]), Polyarthralgia, Pulmonary embolism ([place]), Seizures ([place]), and Wears partial dentures.   Surgical History    Past Surgical History:  Procedure  Laterality Date  . ABDOMINAL HYSTERECTOMY  1990   and  Appendectomy  . AV FISTULA PLACEMENT Left 12/08/2017   Procedure: INSERTION OF ARTERIOVENOUS (AV) GRAFT WITH ARTEGRAFT TO LEFT UPPER ARM;  Surgeon: Conrad Delafield, MD;  Location: [place];  Service: Vascular;  Laterality: Left;  . BASCILIC VEIN TRANSPOSITION Left 01/09/2017   Procedure: LEFT 1ST STAGE BRACHIAL VEIN TRANSPOSITION;  Surgeon: Conrad River Road, MD;  Location: [place];  Service: Vascular;  Laterality: Left;  . BASCILIC VEIN TRANSPOSITION Left 06/16/2017   Procedure: Second Stage BASILIC VEIN TRANSPOSITION  LEFT ARM;  Surgeon: Conrad [place], MD;  Location: [place];  Service: Vascular;  Laterality: Left;  . BIOPSY  03/09/2018   Procedure: BIOPSY;  Surgeon: Wilford Corner, MD;  Location: [facility];  Service: Endoscopy;;  . BIOPSY  01/31/2020   Procedure: BIOPSY;  Surgeon: Otis Brace, MD;  Location: [place];  Service: Gastroenterology;;  . BIOPSY  02/02/2020   Procedure: BIOPSY;  Surgeon: Otis Brace, MD;  Location: [place];  Service: Gastroenterology;;  . CHOLECYSTECTOMY    . COLON RESECTION  x3 --  1978,  1987, 1989   ILEAL RESECTION x2/   [place]  . COLONOSCOPY WITH PROPOFOL N/A 09/25/2014   Procedure: COLONOSCOPY WITH PROPOFOL;  Surgeon: Garlan Fair, MD;  [facility];  Service: Endoscopy;  Laterality: N/A;  . COLONOSCOPY WITH PROPOFOL N/A 03/09/2018   Procedure: COLONOSCOPY WITH PROPOFOL;  Surgeon: Wilford Corner, MD;  [facility];  Service: Endoscopy;  Laterality: N/A;  . COLONOSCOPY WITH PROPOFOL N/A 02/02/2020   Procedure: COLONOSCOPY WITH PROPOFOL;  Surgeon: Otis Brace, MD;  Location: [place];  Service: Gastroenterology;  Laterality: N/A;  . CYSTOSCOPY W/ URETERAL STENT PLACEMENT Right 11/19/2016   Procedure: CYSTOSCOPY WITH RIGHT RETROGRADE PYELOGRAM/ URETEROSCOPY WITH LASER AND RIGHT URETERAL STENT PLACEMENT;  Surgeon: Ardis Hughs, MD;  [facility];  Service: Urology;  Laterality: Right;  . ESOPHAGOGASTRODUODENOSCOPY  03/04/2012   Procedure: ESOPHAGOGASTRODUODENOSCOPY (EGD);  Surgeon: Garlan Fair, MD;  [facility];  Service: Endoscopy;  Laterality: N/A;  . ESOPHAGOGASTRODUODENOSCOPY (EGD) WITH PROPOFOL N/A 01/31/2020   Procedure: ESOPHAGOGASTRODUODENOSCOPY (EGD) WITH PROPOFOL;  Surgeon: Otis Brace, MD;  [facility];  Service: Gastroenterology;  Laterality: N/A;  . EXAMINATION UNDER ANESTHESIA N/A 09/12/2014   Procedure: EXAM UNDER ANESTHESIA;  Surgeon: Rolm Bookbinder, MD;  Location: [facility] [place];  Service: General;  Laterality: N/A;  . EYE SURGERY     laser  . FLEXIBLE SIGMOIDOSCOPY  03/04/2012   Procedure:  FLEXIBLE SIGMOIDOSCOPY;  Surgeon: Garlan Fair, MD;  Location[facility];  Service: Endoscopy;  Laterality: N/A;  . GLAUCOMA SURGERY Bilateral   . HOLMIUM LASER APPLICATION Right 0/94/7096   Procedure: HOLMIUM LASER APPLICATION;  Surgeon: Ardis Hughs, MD;  [facility];  Service: Urology;  Laterality: Right;  . INCISION AND DRAINAGE PERIRECTAL ABSCESS N/A 09/12/2014   Procedure: IRRIGATION AND DEBRIDEMENT PERIRECTAL ABSCESS;  Surgeon: Rolm Bookbinder, MD;  Location: [facility];  Service: General;  Laterality: N/A;  . IR FLUORO GUIDE CV LINE RIGHT  03/20/2017  . IR US GUIDE VASC ACCESS RIGHT  03/20/2017  . NEGATIVE SLEEP STUDY  2008  . PLACEMENT OF SETON N/A 12/01/2014   Procedure: PLACEMENT OF SETON;  Surgeon: [name], MD;  Location: [facility];  Service: General;  Laterality: N/A;  . POLYPECTOMY  03/09/2018   Procedure: POLYPECTOMY;  Surgeon: Wilford Corner, MD;  [facility];  Service: Endoscopy;;  . POLYPECTOMY  02/02/2020   Procedure: POLYPECTOMY;  Surgeon: Otis Brace, MD;  Location: [facility];  Service: Gastroenterology;;  . RECTAL EXAM UNDER ANESTHESIA N/A 12/01/2014   Procedure: RECTAL EXAM UNDER ANESTHESIA;  Surgeon: [name], MD;   [facility];  Service: General;  Laterality: N/A;  . TOTAL THYROIDECTOMY  10-13-2003  . TRANSTHORACIC ECHOCARDIOGRAM  07-11-2013   mild LVH,  ef 55%,  moderate AR,  mild MR and TR,  trivial PR  . TUBAL LIGATION       Social History   reports that she quit smoking about 2 years ago. Her smoking use included cigarettes. She started smoking about 53 years ago. She has a 35.00 pack-year smoking history. She has never used smokeless tobacco. She reports that she does not drink alcohol and does not use drugs.   Family History   Her family history includes COPD in her mother; Colon cancer in her brother; Diabetes in her brother; Diverticulitis in her mother; Heart attack in her brother, father, and mother; Heart disease in her father and mother; Hypertension in her father and mother; Thyroid disease in her brother.   Allergies Allergies  Allergen Reactions  . Mercaptopurine Other (See Comments)    Caused pancreatitis  . Remicade [Infliximab] Other (See Comments)    CAUSED JOINT PAIN     Home Medications  Prior to Admission medications   Medication Sig Start Date End Date Taking? Authorizing Provider  acetaminophen (TYLENOL) 500 MG tablet Take 1,000 mg by mouth every 6 (six) hours as needed for mild pain.     [provider]  Adalimumab (HUMIRA) 40 MG/0.8ML PSKT Inject 40 mg into the skin every 14 (fourteen) days.     [provider]  albuterol (VENTOLIN HFA) 108 (90 Base) MCG/ACT inhaler INHALE 2 PUFFS BY MOUTH EVERY 6 HOURS IF NEEDED FOR WHEEZING OR SHORTNESS OF BREATH Patient taking differently: Inhale 2 puffs into the lungs every 6 (six) hours as needed for wheezing or shortness of breath. INHALE 2 PUFFS BY MOUTH EVERY 6 HOURS IF NEEDED FOR WHEEZING OR SHORTNESS OF BREATH 05/31/19   Chesley Mires, MD  calcitRIOL (ROCALTROL) 0.25 MCG capsule Take 1 capsule (0.25 mcg total) by mouth every Monday, Wednesday, and Friday with hemodialysis. 02/03/20    Regalado, Belkys A, MD  calcium acetate (PHOSLO) 667 MG capsule Take 1 capsule (667 mg total) by mouth 3 (three) times daily with meals. 02/03/20   Regalado, Cassie Freer, MD  calcium-vitamin D (OSCAL WITH D) 500-200 MG-UNIT TABS tablet Take  1 tablet by mouth as needed (with dialysis). 02/03/20   Regalado, Belkys A, MD  cyanocobalamin (,VITAMIN B-12,) 1000 MCG/ML injection Inject 1,000 mcg into the muscle every 30 (thirty) days.     [provider]  dexamethasone (DECADRON) 4 MG tablet Take 1 tablet (4 mg total) by mouth 3 (three) times daily. 02/21/20   Regalado, Belkys A, MD  diltiazem (CARDIZEM) 30 MG tablet Take 1 tablet (30 mg total) by mouth every 8 (eight) hours. 01/24/20   Nahser, Wonda Cheng, MD  ELIQUIS 5 MG TABS tablet Take 2.5 mg by mouth 2 (two) times daily. Takes 1/2 tablet (2.55m) bid 08/23/18   [provider]  fluticasone (FLONASE) 50 MCG/ACT nasal spray Place 1 spray into both nostrils daily as needed for allergies.  03/19/19   [provider]  Fluticasone-Umeclidin-Vilant (TRELEGY ELLIPTA) 100-62.5-25 MCG/INH AEPB Inhale 1 puff into the lungs daily. 01/17/20   WMartyn Ehrich NP  ipratropium-albuterol (DUONEB) 0.5-2.5 (3) MG/3ML SOLN Take 3 mLs by nebulization every 6 (six) hours as needed. Patient taking differently: Take 3 mLs by nebulization every 6 (six) hours as needed (sob/wheezing).  12/30/18   NFenton Foy NP  levothyroxine (SYNTHROID, LEVOTHROID) 125 MCG tablet Take 125 mcg by mouth daily before breakfast.     [provider]  lidocaine-prilocaine (EMLA) cream Apply 1 application topically every Monday, Wednesday, and Friday.  11/29/18   [provider]  LORazepam (ATIVAN) 0.5 MG tablet Take 2 tablets (1 mg total) by mouth as needed for anxiety (30 minutes prior to Radiation treatment(s)). 02/23/20   Bruning, Ashlyn, PA-C  metoprolol tartrate (LOPRESSOR) 25 MG tablet Take 25 mg by mouth 2 (two) times daily. 02/10/20   [provider]  midodrine (PROAMATINE) 10 MG tablet Take 1 tablet (10 mg total) by mouth 3 (three) times daily with meals. 01/24/20   Nahser, PWonda Cheng MD  nitroGLYCERIN (NITROSTAT) 0.4 MG SL tablet Place 1 tablet (0.4 mg total) under the tongue every 5 (five) minutes as needed for chest pain. 05/03/19 02/15/20  Nahser, PWonda Cheng MD  ondansetron (ZOFRAN ODT) 4 MG disintegrating tablet Take 1 tablet (4 mg total) by mouth every 8 (eight) hours as needed for nausea or vomiting. 02/03/20   Regalado, Belkys A, MD  oxyCODONE (OXY IR/ROXICODONE) 5 MG immediate release tablet Take 1 tablet (5 mg total) by mouth every 4 (four) hours as needed for moderate pain. 02/21/20   Regalado, Belkys A, MD  OXYGEN Inhale 2-3 L into the lungs See admin instructions. Use every night and during the day as needed for shortness of breath    [provider]  pantoprazole (PROTONIX) 40 MG tablet Take 1 tablet (40 mg total) by mouth 2 (two) times daily. 02/03/20 03/04/20  Regalado, BCassie Freer MD  Respiratory Therapy Supplies (NEBULIZER) DEVI 1 Device by Does not apply route as needed. 12/07/18   NFenton Foy NP  sorbitol 70 % SOLN Take 30 mLs by mouth daily as needed for moderate constipation. 02/21/20   Regalado, Belkys A, MD  sorbitol 70 % solution Take 15 mLs by mouth daily as needed. 02/21/20   Regalado, BCassie Freer MD     Signature:   WJohnsie Cancel NP-C [facility] Contact / Pager information can be found on Amion  02/29/2020, 6:09 PM

## 2020-02-29 NOTE — ED Notes (Signed)
Help get patient on the monitor did ekg shown to Dr Billy Fischer patient is resting with call bell in reach and family at bedside

## 2020-03-01 ENCOUNTER — Other Ambulatory Visit: Payer: Self-pay | Admitting: Radiation Therapy

## 2020-03-01 ENCOUNTER — Telehealth: Payer: Self-pay | Admitting: *Deleted

## 2020-03-01 ENCOUNTER — Other Ambulatory Visit: Payer: Self-pay | Admitting: *Deleted

## 2020-03-01 ENCOUNTER — Inpatient Hospital Stay: Payer: Medicare Other

## 2020-03-01 ENCOUNTER — Inpatient Hospital Stay: Payer: Medicare Other | Admitting: Internal Medicine

## 2020-03-01 ENCOUNTER — Inpatient Hospital Stay (HOSPITAL_COMMUNITY): Payer: Medicare Other

## 2020-03-01 DIAGNOSIS — I2699 Other pulmonary embolism without acute cor pulmonale: Secondary | ICD-10-CM

## 2020-03-01 DIAGNOSIS — J439 Emphysema, unspecified: Secondary | ICD-10-CM

## 2020-03-01 DIAGNOSIS — J449 Chronic obstructive pulmonary disease, unspecified: Secondary | ICD-10-CM | POA: Diagnosis not present

## 2020-03-01 DIAGNOSIS — J9621 Acute and chronic respiratory failure with hypoxia: Secondary | ICD-10-CM | POA: Diagnosis not present

## 2020-03-01 DIAGNOSIS — J95811 Postprocedural pneumothorax: Secondary | ICD-10-CM | POA: Diagnosis not present

## 2020-03-01 LAB — COMPREHENSIVE METABOLIC PANEL
ALT: 17 U/L (ref 0–44)
AST: 17 U/L (ref 15–41)
Albumin: 3 g/dL — ABNORMAL LOW (ref 3.5–5.0)
Alkaline Phosphatase: 58 U/L (ref 38–126)
Anion gap: 16 — ABNORMAL HIGH (ref 5–15)
BUN: 78 mg/dL — ABNORMAL HIGH (ref 8–23)
CO2: 21 mmol/L — ABNORMAL LOW (ref 22–32)
Calcium: 8.1 mg/dL — ABNORMAL LOW (ref 8.9–10.3)
Chloride: 103 mmol/L (ref 98–111)
Creatinine, Ser: 7.09 mg/dL — ABNORMAL HIGH (ref 0.44–1.00)
GFR calc non Af Amer: 5 mL/min — ABNORMAL LOW (ref >60)
Glucose, Bld: 195 mg/dL — ABNORMAL HIGH (ref 70–99)
Potassium: 5.2 mmol/L — ABNORMAL HIGH (ref 3.5–5.1)
Sodium: 140 mmol/L (ref 135–145)
Total Bilirubin: 0.7 mg/dL (ref 0.3–1.2)
Total Protein: 5.6 g/dL — ABNORMAL LOW (ref 6.5–8.1)

## 2020-03-01 LAB — CBC
HCT: 27.3 % — ABNORMAL LOW (ref 36.0–46.0)
Hemoglobin: 8.6 g/dL — ABNORMAL LOW (ref 12.0–15.0)
MCH: 30.3 pg (ref 26.0–34.0)
MCHC: 31.5 g/dL (ref 30.0–36.0)
MCV: 96.1 fL (ref 80.0–100.0)
Platelets: 110 10*3/uL — ABNORMAL LOW (ref 150–400)
RBC: 2.84 MIL/uL — ABNORMAL LOW (ref 3.87–5.11)
RDW: 17.7 % — ABNORMAL HIGH (ref 11.5–15.5)
WBC: 14.5 10*3/uL — ABNORMAL HIGH (ref 4.0–10.5)
nRBC: 0 % (ref 0.0–0.2)

## 2020-03-01 LAB — RENAL FUNCTION PANEL
Albumin: 2.8 g/dL — ABNORMAL LOW (ref 3.5–5.0)
Anion gap: 18 — ABNORMAL HIGH (ref 5–15)
BUN: 96 mg/dL — ABNORMAL HIGH (ref 8–23)
CO2: 23 mmol/L (ref 22–32)
Calcium: 8.5 mg/dL — ABNORMAL LOW (ref 8.9–10.3)
Chloride: 102 mmol/L (ref 98–111)
Creatinine, Ser: 8.13 mg/dL — ABNORMAL HIGH (ref 0.44–1.00)
GFR calc non Af Amer: 4 mL/min — ABNORMAL LOW (ref >60)
Glucose, Bld: 105 mg/dL — ABNORMAL HIGH (ref 70–99)
Phosphorus: >30 mg/dL — ABNORMAL HIGH (ref 2.5–4.6)
Potassium: 6.6 mmol/L (ref 3.5–5.1)
Sodium: 143 mmol/L (ref 135–145)

## 2020-03-01 LAB — CYTOLOGY - NON PAP

## 2020-03-01 LAB — TROPONIN I (HIGH SENSITIVITY): Troponin I (High Sensitivity): 95 ng/L — ABNORMAL HIGH (ref <18)

## 2020-03-01 MED ORDER — SODIUM CHLORIDE 0.9 % IV SOLN: 100.0000 mL | INTRAVENOUS | Status: DC | PRN

## 2020-03-01 MED ORDER — UMECLIDINIUM BROMIDE 62.5 MCG/INH IN AEPB
1.0000 | INHALATION_SPRAY | Freq: Every day | RESPIRATORY_TRACT | Status: DC
  Administered 2020-03-03: 09:00:00 1 via RESPIRATORY_TRACT
  Filled 2020-03-01: qty 7

## 2020-03-01 MED ORDER — OXYCODONE HCL 5 MG PO TABS
ORAL_TABLET | ORAL | Status: AC
  Filled 2020-03-01: qty 1

## 2020-03-01 MED ORDER — ALTEPLASE 2 MG IJ SOLR: 2.0000 mg | Freq: Once | INTRAMUSCULAR | Status: DC | PRN

## 2020-03-01 MED ORDER — FLUTICASONE FUROATE-VILANTEROL 100-25 MCG/INH IN AEPB
1.0000 | INHALATION_SPRAY | Freq: Every day | RESPIRATORY_TRACT | Status: DC
  Administered 2020-03-02 – 2020-03-03 (×2): 1 via RESPIRATORY_TRACT
  Filled 2020-03-01: qty 28

## 2020-03-01 MED ORDER — OXYCODONE HCL 5 MG PO TABS
5.0000 mg | ORAL_TABLET | Freq: Four times a day (QID) | ORAL | Status: DC | PRN
  Administered 2020-03-01: 5 mg via ORAL
  Filled 2020-03-01: qty 1

## 2020-03-01 MED ORDER — LIDOCAINE HCL (PF) 1 % IJ SOLN: 5.0000 mL | INTRAMUSCULAR | Status: DC | PRN

## 2020-03-01 MED ORDER — PENTAFLUOROPROP-TETRAFLUOROETH EX AERO: 1.0000 "application " | INHALATION_SPRAY | CUTANEOUS | Status: DC | PRN

## 2020-03-01 MED ORDER — LIDOCAINE-PRILOCAINE 2.5-2.5 % EX CREA: 1.0000 "application " | TOPICAL_CREAM | CUTANEOUS | Status: DC | PRN

## 2020-03-01 MED ORDER — FENTANYL CITRATE (PF) 100 MCG/2ML IJ SOLN
12.5000 ug | INTRAMUSCULAR | Status: DC | PRN
  Administered 2020-03-01 – 2020-03-02 (×4): 12.5 ug via INTRAVENOUS
  Filled 2020-03-01 (×4): qty 2

## 2020-03-01 NOTE — Progress Notes (Signed)
Patient returned from HD. VSS. Will continue to monitor.

## 2020-03-01 NOTE — Progress Notes (Signed)
NAME:  Natasha [name], MRN:  [id number], DOB:  February 16, 1946, LOS: 1 ADMISSION DATE:  02/29/2020, CONSULTATION DATE:  02/29/2020 REFERRING MD:  Dr. Ralene Bathe, CHIEF COMPLAINT:  Pneumothorax   Brief History   74yo female presented with progressive SOB during HD treatment. Of note patient underwent elective bronchoscopy for tissue sampling of stage IV lung cancer with Dr. Valeta Harms 10/5. On admission she is seen with large left pneumothorax requiring pigtail chest tube insertion in ED.   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 Patient reported the SOB began last night and has progressive worsened. The SOB is worse when lying flat. She denies any alleviating factors. She also denies any fever, chills, cough, chest pain, or abdominal pain.  Of note patient underwent elective bronchoscopy with Dr. Valeta Harms 10/5 for tissue sampling in the setting of stage IV lung cancer. Patient had originally opted not to treat lung mass but later decided to purse workup this year. Given significant history of COPD patient and clinical team deferred bronchoscopy in attempt to obtain tissue sampling from spinal lesion however this was non-diagnostic and this resulted in need of bronchoscopy.   On arrival patient was seen tachycardiac, tachypnic, and mildly hypertensive. Lab work significant for K 5.2, Creatinine 7.09, calcium 8.1, BNP 669.3, WBC 13.7, hgb 6.9, and hct 22.3. CXR revealed large left pneumothorax. 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 Known left mid lung zone mass not well visualized.   Micro Data:    Antimicrobials:     Interim history/subjective:  Currently hemodialysis in place Objective   Blood pressure 117/71, pulse (!) 101, temperature 98.4 F (36.9 C), temperature source Oral, resp. rate 18, weight 53.2 kg, SpO2 100 %.        Intake/Output Summary (Last 24 hours) at 03/01/2020 0930 Last data filed at 03/01/2020 0400 Gross per 24 hour  Intake 250.67 ml  Output --  Net 250.67 ml   Filed Weights   03/01/20 0801  Weight: 53.2 kg    Examination: General: Elderly female no acute distress at rest HEENT: MM pink/moist no JVD or lymphadenopathy is appreciated Neuro: Grossly intact without focal defect CV: Heart sounds are regular PULM: Diminished in the bases left chest tube in place without air leak on 20 cm of suction GI: soft, bsx4 active  Extremities: warm/dry,  edema, left AV graft is being utilized for hemodialysis Skin: no rashes or lesions    cxr 03/01/20 with left apical pnx  Resolved Hospital Problem list     Assessment & Plan:  Large left pneumothorax  -Post elective bronchoscopy 10/5 for tissue sampling in the setting of IV lung cancer  Stage IV lung cancer Chronic hypoxic respiratory failure  -Baseline requires 3Lnc Continuously  Severe COPD with underlying emphysema  -GOLDstage  III-IV;PFTs in November 2020 showed FEV1 0.57 (35%), ratio 37. P: Routine chest tube care Currently no air leak while at hemodialysis will forego going to [facility] especially since there is a trace pneumothorax apically remaining Serial chest x-rays May consider anterior chest tube Continue suction for now Best practice:  Diet: Regular  Pain/Anxiety/Delirium  protocol (if indicated): PRns VAP protocol (if indicated): N/A DVT prophylaxis: Resume home eliquis  GI prophylaxis: PPI Glucose control: SSI Mobility: Up with assistance  Code Status: DNR Family Communication: 03/01/2020 patient updated hemodialysis unit Disposition: Floor   Labs   CBC: Recent Labs  Lab 02/28/20 0939 02/29/20 1537 02/29/20 1604 02/29/20 1609 03/01/20 0609  WBC 14.9* 13.7*  --   --  14.5*  NEUTROABS  --  12.6*  --   --   --   HGB 7.5* 6.9* 6.5* 5.8* 8.6*  HCT 25.1* 22.3* 19.0* 17.0* 27.3*  MCV 100.4* 96.1  --   --  96.1  PLT 169 132*  --   --  110*    Basic Metabolic Panel: Recent Labs  Lab 02/28/20 0939 02/28/20 0939 02/28/20 1333 02/29/20 1537 02/29/20 1604 02/29/20 1609 03/01/20 0609  NA 140  --   --  140 139 139 143  K 6.2*   < > 3.0* 5.2* 4.4 4.5 6.6*  CL 103  --   --  103 110  --  102  CO2 18*  --   --  21*  --   --  23  GLUCOSE 91  --   --  195* 143*  --  105*  BUN 135*  --   --  78* 70*  --  96*  CREATININE 11.08*  --   --  7.09* 6.80*  --  8.13*  CALCIUM 9.1  --   --  8.1*  --   --  8.5*  PHOS  --   --   --   --   --   --  >30.0*   < > = values in this interval not displayed.   GFR: Estimated Creatinine Clearance: 5.2 mL/min (A) (by C-G formula based on SCr of 8.13 mg/dL (H)). Recent Labs  Lab 02/28/20 0939 02/29/20 1537 03/01/20 0609  WBC 14.9* 13.7* 14.5*    Liver Function Tests: Recent Labs  Lab 02/28/20 0939 02/29/20 1537 03/01/20 0609  AST 17 17  --   ALT 17 17  --   ALKPHOS 51 58  --   BILITOT 0.5 0.7  --   PROT 5.8* 5.6*  --   ALBUMIN 3.1* 3.0* 2.8*   No results for input(s): LIPASE, AMYLASE in the last 168 hours. No results for input(s): AMMONIA in the last 168 hours.  ABG    Component Value Date/Time   PHART 7.350 04/15/2019 1436   PCO2ART 40.6 04/15/2019 1436   PO2ART 121 (H) 04/15/2019 1436   HCO3 18.5 (L) 02/29/2020 1609   TCO2 19 (L) 02/29/2020 1609   ACIDBASEDEF 4.0 (H) 02/29/2020 1609   O2SAT  100.0 02/29/2020 1609     Coagulation Profile: Recent Labs  Lab 02/28/20 0939 02/29/20 1644  INR 1.1 1.0    Cardiac Enzymes: No results for input(s): CKTOTAL, CKMB, CKMBINDEX, TROPONINI in the last 168 hours.  HbA1C: Hgb A1c MFr Bld  Date/Time Value Ref Range Status  11/18/2019 05:03 PM 5.0 4.8 - 5.6 % Final    Comment:    (NOTE) Pre diabetes:          5.7%-6.4%  Diabetes:              >  6.4%  Glycemic control for   <7.0% adults with diabetes     CBG: No results for input(s): GLUCAP in the last 168 hours.  Richardson Landry Anala Whisenant ACNP Acute Care Nurse Practitioner [place] Please consult Amion 03/01/2020, 9:30 AM

## 2020-03-01 NOTE — Consult Note (Signed)
[facility] Renal Consultation Note    Indication for Consultation:  Management of ESRD/hemodialysis; anemia, hypertension/volume and secondary hyperparathyroidism  HPI: Natasha [name] is a 74 y.o. female with ESRD on HD MWF at [facility]. PMH significant for Crohn's, COPD on chronic O2,  HTN, AFib on Eliquis. Recent discovery of lung mass/malignancy in June 2021. [facility] admission last month and found to have metastatic lesions to the brain. She underwent outpatient bronchoscopy on Tuesday for biopsy/staging of lung cancer. She went to her usual dialysis on Wednesday and became progressively SOB so EMS called.  ED course significant for tachycardia and tachypnea on arrival. CXR showed large left pneumothorax. PCCM consulted and placed pigtail chest tube. Labs this am notable for hyperkalemia K+ 6.6, BUN 96 Cr 8.13, Phos >30. Severe anemia also noted. Hgb dropped to 5.8 and improved to 8.6 s/p blood transfusion.  Seen and examined in dialysis unit. Bath changed to low potassium bath. She's alert and feeling better, sore from chest tube placement.  Breathing is improving. O2 sats 100% on 3L [place]. She's eating a breakfast sandwich, asking appropriate questions.   Past Medical History:  Diagnosis Date  . Anal fistula   . Anemia in chronic kidney disease (CKD)   . Anxiety   . Aortic insufficiency    Echo 3/18: mod conc LVH, EF 60-65, no RWMA, Gr 1 DD, mod AI, mild MR, normal RVSF, Trivial TR  . Arthritis   . Borderline hypertension   . Bulging disc    L3-L4  . Cancer ([place])    Brain and spinal cord mets.  . CHF (congestive heart failure) ([place])   . Chronic diarrhea    due to crohn's  . CKD (chronic kidney disease), stage IV (HCC)    MWF- [facility]  . Crohn's disease ([place])    chronic ileitis  . Dyspnea    with exertion  . Emphysema/COPD ([place])   . GERD (gastroesophageal reflux disease)    denies  . History of blood transfusion   . History of glaucoma    . History of kidney stones   . History of pancreatitis    2008--  mercaptopurine  . History of small bowel obstruction    12-03-2010  due to crohn's ileitis  . Hypertension    no longer on medications  . Hypothyroidism, postsurgical    multinodule w/ hurthle cells  . Osteoporosis   . PAF (paroxysmal atrial fibrillation) ([place])   . Perianal Crohn's disease ([place])   . Peripheral vascular disease ([place])    blood clot behind knee left leg  . Polyarthralgia   . Pulmonary embolism ([place])   . Seizures ([place])    03/2017  . Wears partial dentures    upper   Past Surgical History:  Procedure Laterality Date  . ABDOMINAL HYSTERECTOMY  1990   and  Appendectomy  . AV FISTULA PLACEMENT Left 12/08/2017   Procedure: INSERTION OF ARTERIOVENOUS (AV) GRAFT WITH ARTEGRAFT TO LEFT UPPER ARM;  Surgeon: Conrad Zumbrota, MD;  Location: [place];  Service: Vascular;  Laterality: Left;  . BASCILIC VEIN TRANSPOSITION Left 01/09/2017   Procedure: LEFT 1ST STAGE BRACHIAL VEIN TRANSPOSITION;  Surgeon: Conrad [place], MD;  Location: [place];  Service: Vascular;  Laterality: Left;  . BASCILIC VEIN TRANSPOSITION Left 06/16/2017   Procedure: Second Stage BASILIC VEIN TRANSPOSITION  LEFT ARM;  Surgeon: Conrad Cyril, MD;  Location: [place];  Service: Vascular;  Laterality: Left;  . BIOPSY  03/09/2018  Procedure: BIOPSY;  Surgeon: Wilford Corner, MD;  Location: [facility];  Service: Endoscopy;;  . BIOPSY  01/31/2020   Procedure: BIOPSY;  Surgeon: Otis Brace, MD;  Location: [place];  Service: Gastroenterology;;  . BIOPSY  02/02/2020   Procedure: BIOPSY;  Surgeon: Otis Brace, MD;  Location: [place];  Service: Gastroenterology;;  . CHOLECYSTECTOMY    . COLON RESECTION  x3 --  1978,  1987, 1989   ILEAL RESECTION x2/   [place]  . COLONOSCOPY WITH PROPOFOL N/A 09/25/2014   Procedure: COLONOSCOPY WITH PROPOFOL;  Surgeon: Garlan Fair, MD;  [facility];  Service: Endoscopy;   Laterality: N/A;  . COLONOSCOPY WITH PROPOFOL N/A 03/09/2018   Procedure: COLONOSCOPY WITH PROPOFOL;  Surgeon: Wilford Corner, MD;  [facility];  Service: Endoscopy;  Laterality: N/A;  . COLONOSCOPY WITH PROPOFOL N/A 02/02/2020   Procedure: COLONOSCOPY WITH PROPOFOL;  Surgeon: Otis Brace, MD;  Location: [place];  Service: Gastroenterology;  Laterality: N/A;  . CYSTOSCOPY W/ URETERAL STENT PLACEMENT Right 11/19/2016   Procedure: CYSTOSCOPY WITH RIGHT RETROGRADE PYELOGRAM/ URETEROSCOPY WITH LASER AND RIGHT URETERAL STENT PLACEMENT;  Surgeon: Ardis Hughs, MD;  [facility];  Service: Urology;  Laterality: Right;  . ESOPHAGOGASTRODUODENOSCOPY  03/04/2012   Procedure: ESOPHAGOGASTRODUODENOSCOPY (EGD);  Surgeon: Garlan Fair, MD;  [facility];  Service: Endoscopy;  Laterality: N/A;  . ESOPHAGOGASTRODUODENOSCOPY (EGD) WITH PROPOFOL N/A 01/31/2020   Procedure: ESOPHAGOGASTRODUODENOSCOPY (EGD) WITH PROPOFOL;  Surgeon: Otis Brace, MD;  [facility];  Service: Gastroenterology;  Laterality: N/A;  . EXAMINATION UNDER ANESTHESIA N/A 09/12/2014   Procedure: EXAM UNDER ANESTHESIA;  Surgeon: Rolm Bookbinder, MD;  Location: [place];  Service: General;  Laterality: N/A;  . EYE SURGERY     laser  . FLEXIBLE SIGMOIDOSCOPY  03/04/2012   Procedure: FLEXIBLE SIGMOIDOSCOPY;  Surgeon: Garlan Fair, MD;  [facility];  Service: Endoscopy;  Laterality: N/A;  . GLAUCOMA SURGERY Bilateral   . HOLMIUM LASER APPLICATION Right 3/53/6144   Procedure: HOLMIUM LASER APPLICATION;  Surgeon: Ardis Hughs, MD;  [facility];  Service: Urology;  Laterality: Right;  . INCISION AND DRAINAGE PERIRECTAL ABSCESS N/A 09/12/2014   Procedure: IRRIGATION AND DEBRIDEMENT PERIRECTAL ABSCESS;  Surgeon: Rolm Bookbinder, MD;  Location: [place];  Service: General;  Laterality: N/A;  . IR FLUORO GUIDE CV LINE RIGHT  03/20/2017  . IR US GUIDE VASC ACCESS RIGHT   03/20/2017  . NEGATIVE SLEEP STUDY  2008  . PLACEMENT OF SETON N/A 12/01/2014   Procedure: PLACEMENT OF SETON;  Surgeon: [name], MD;  Location: [facility];  Service: General;  Laterality: N/A;  . POLYPECTOMY  03/09/2018   Procedure: POLYPECTOMY;  Surgeon: Wilford Corner, MD;  [facility];  Service: Endoscopy;;  . POLYPECTOMY  02/02/2020   Procedure: POLYPECTOMY;  Surgeon: Otis Brace, MD;  Location: [facility];  Service: Gastroenterology;;  . RECTAL EXAM UNDER ANESTHESIA N/A 12/01/2014   Procedure: RECTAL EXAM UNDER ANESTHESIA;  Surgeon: [name], MD;  Location: [facility];  Service: General;  Laterality: N/A;  . TOTAL THYROIDECTOMY  10-13-2003  . TRANSTHORACIC ECHOCARDIOGRAM  07-11-2013   mild LVH,  ef 55%,  moderate AR,  mild MR and TR,  trivial PR  . TUBAL LIGATION     Family History  Problem Relation Age of Onset  . Hypertension Father   . Heart disease Father   . Heart attack Father   . Diverticulitis Mother   . COPD  Mother   . Hypertension Mother   . Heart disease Mother   . Heart attack Mother   . Heart attack Brother   . Colon cancer Brother   . Diabetes Brother   . Thyroid disease Brother    Social History:  reports that she quit smoking about 2 years ago. Her smoking use included cigarettes. She started smoking about 53 years ago. She has a 35.00 pack-year smoking history. She has never used smokeless tobacco. She reports that she does not drink alcohol and does not use drugs. Allergies  Allergen Reactions  . Mercaptopurine Other (See Comments)    Caused pancreatitis  . Remicade [Infliximab] Other (See Comments)    CAUSED JOINT PAIN   Prior to Admission medications   Medication Sig Start Date End Date Taking? Authorizing Provider  acetaminophen (TYLENOL) 500 MG tablet Take 1,000 mg by mouth every 6 (six) hours as needed for mild pain.    Yes [provider]  Adalimumab (HUMIRA) 40 MG/0.8ML PSKT  Inject 40 mg into the skin every 14 (fourteen) days.    Yes [provider]  albuterol (VENTOLIN HFA) 108 (90 Base) MCG/ACT inhaler INHALE 2 PUFFS BY MOUTH EVERY 6 HOURS IF NEEDED FOR WHEEZING OR SHORTNESS OF BREATH Patient taking differently: Inhale 2 puffs into the lungs every 6 (six) hours as needed for wheezing or shortness of breath. INHALE 2 PUFFS BY MOUTH EVERY 6 HOURS IF NEEDED FOR WHEEZING OR SHORTNESS OF BREATH 05/31/19  Yes Chesley Mires, MD  calcitRIOL (ROCALTROL) 0.25 MCG capsule Take 1 capsule (0.25 mcg total) by mouth every Monday, Wednesday, and Friday with hemodialysis. 02/03/20  Yes Regalado, Belkys A, MD  calcium acetate (PHOSLO) 667 MG capsule Take 1 capsule (667 mg total) by mouth 3 (three) times daily with meals. 02/03/20  Yes Regalado, Belkys A, MD  calcium-vitamin D (OSCAL WITH D) 500-200 MG-UNIT TABS tablet Take 1 tablet by mouth as needed (with dialysis). 02/03/20  Yes Regalado, Belkys A, MD  cyanocobalamin (,VITAMIN B-12,) 1000 MCG/ML injection Inject 1,000 mcg into the muscle every 30 (thirty) days.    Yes [provider]  dexamethasone (DECADRON) 4 MG tablet Take 1 tablet (4 mg total) by mouth 3 (three) times daily. 02/21/20  Yes Regalado, Belkys A, MD  ELIQUIS 5 MG TABS tablet Take 2.5 mg by mouth 2 (two) times daily. Takes 1/2 tablet (2.77m) bid 08/23/18  Yes [provider]  fluticasone (FLONASE) 50 MCG/ACT nasal spray Place 1 spray into both nostrils daily as needed for allergies.  03/19/19  Yes [provider]  Fluticasone-Umeclidin-Vilant (TRELEGY ELLIPTA) 100-62.5-25 MCG/INH AEPB Inhale 1 puff into the lungs daily. 01/17/20  Yes WMartyn Ehrich NP  ipratropium-albuterol (DUONEB) 0.5-2.5 (3) MG/3ML SOLN Take 3 mLs by nebulization every 6 (six) hours as needed. Patient taking differently: Take 3 mLs by nebulization every 6 (six) hours as needed (sob/wheezing).  12/30/18  Yes NFenton Foy NP  levothyroxine (SYNTHROID, LEVOTHROID) 125 MCG  tablet Take 125 mcg by mouth daily before breakfast.    Yes [provider]  lidocaine-prilocaine (EMLA) cream Apply 1 application topically every Monday, Wednesday, and Friday.  11/29/18  Yes [provider]  LORazepam (ATIVAN) 0.5 MG tablet Take 2 tablets (1 mg total) by mouth as needed for anxiety (30 minutes prior to Radiation treatment(s)). 02/23/20  Yes Bruning, Ashlyn, PA-C  metoprolol tartrate (LOPRESSOR) 25 MG tablet Take 25 mg by mouth 2 (two) times daily. 02/10/20  Yes [provider]  midodrine (PRidgeway  10 MG tablet Take 1 tablet (10 mg total) by mouth 3 (three) times daily with meals. 01/24/20  Yes Nahser, Wonda Cheng, MD  nitroGLYCERIN (NITROSTAT) 0.4 MG SL tablet Place 1 tablet (0.4 mg total) under the tongue every 5 (five) minutes as needed for chest pain. 05/03/19 02/29/20 Yes Nahser, Wonda Cheng, MD  ondansetron (ZOFRAN ODT) 4 MG disintegrating tablet Take 1 tablet (4 mg total) by mouth every 8 (eight) hours as needed for nausea or vomiting. 02/03/20  Yes Regalado, Belkys A, MD  oxyCODONE (OXY IR/ROXICODONE) 5 MG immediate release tablet Take 1 tablet (5 mg total) by mouth every 4 (four) hours as needed for moderate pain. 02/21/20  Yes Regalado, Belkys A, MD  OXYGEN Inhale 5 L into the lungs See admin instructions. Use every night and during the day as needed for shortness of breath    Yes [provider]  sorbitol 70 % SOLN Take 30 mLs by mouth daily as needed for moderate constipation. 02/21/20  Yes Regalado, Belkys A, MD  diltiazem (CARDIZEM) 30 MG tablet Take 1 tablet (30 mg total) by mouth every 8 (eight) hours. Patient not taking: Reported on 02/29/2020 01/24/20   Nahser, Wonda Cheng, MD  pantoprazole (PROTONIX) 40 MG tablet Take 1 tablet (40 mg total) by mouth 2 (two) times daily. 02/03/20 03/04/20  Regalado, Cassie Freer, MD  Respiratory Therapy Supplies (NEBULIZER) DEVI 1 Device by Does not apply route as needed. 12/07/18   Fenton Foy, NP  sorbitol 70 %  solution Take 15 mLs by mouth daily as needed. 02/21/20   Regalado, Cassie Freer, MD   Current Facility-Administered Medications  Medication Dose Route Frequency Provider Last Rate Last Admin  . 0.9 %  sodium chloride infusion (Manually program via Guardrails IV Fluids)   Intravenous Once Marcelyn Bruins, MD      . 0.9 %  sodium chloride infusion  100 mL Intravenous PRN Gean Quint, MD      . 0.9 %  sodium chloride infusion  100 mL Intravenous PRN Gean Quint, MD      . albuterol (VENTOLIN HFA) 108 (90 Base) MCG/ACT inhaler 2 puff  2 puff Inhalation Q6H PRN Marcelyn Bruins, MD      . alteplase (CATHFLO ACTIVASE) injection 2 mg  2 mg Intracatheter Once PRN Gean Quint, MD      . apixaban Arne Cleveland) tablet 2.5 mg  2.5 mg Oral BID Marcelyn Bruins, MD      . Derrill Memo ON 03/02/2020] calcitRIOL (ROCALTROL) capsule 0.25 mcg  0.25 mcg Oral Q M,W,F-HD Marcelyn Bruins, MD      . calcium acetate (PHOSLO) capsule 667 mg  667 mg Oral TID WC Marcelyn Bruins, MD      . calcium-vitamin D (OSCAL WITH D) 500-200 MG-UNIT per tablet 1 tablet  1 tablet Oral PRN Marcelyn Bruins, MD      . Chlorhexidine Gluconate Cloth 2 % PADS 6 each  6 each Topical Q0600 Gean Quint, MD   6 each at 03/01/20 [phone]  . dexamethasone (DECADRON) tablet 4 mg  4 mg Oral TID Marcelyn Bruins, MD   4 mg at 02/29/20 2237  . fluticasone furoate-vilanterol (BREO ELLIPTA) 100-25 MCG/INH 1 puff  1 puff Inhalation Daily Marcelyn Bruins, MD       And  . umeclidinium bromide (INCRUSE ELLIPTA) 62.5 MCG/INH 1 puff  1 puff Inhalation Daily Marcelyn Bruins, MD      . ipratropium-albuterol (DUONEB) 0.5-2.5 (3) MG/3ML nebulizer  solution 3 mL  3 mL Nebulization Q6H PRN Marcelyn Bruins, MD      . levothyroxine (SYNTHROID) tablet 125 mcg  125 mcg Oral QAC breakfast Marcelyn Bruins, MD   125 mcg at 03/01/20 [phone]  . lidocaine (LIDODERM) 5 % 1 patch  1 patch Transdermal Q24H Marcelyn Bruins, MD   1 patch at 02/29/20 1951   . lidocaine (PF) (XYLOCAINE) 1 % injection 5 mL  5 mL Intradermal PRN Gean Quint, MD      . lidocaine-prilocaine (EMLA) cream 1 application  1 application Topical PRN Gean Quint, MD      . metoprolol tartrate (LOPRESSOR) tablet 25 mg  25 mg Oral BID Marcelyn Bruins, MD   25 mg at 02/29/20 2236  . nitroGLYCERIN (NITROSTAT) SL tablet 0.4 mg  0.4 mg Sublingual Q5 min PRN Marcelyn Bruins, MD      . ondansetron (ZOFRAN-ODT) disintegrating tablet 4 mg  4 mg Oral Q8H PRN Marcelyn Bruins, MD      . oxyCODONE (Oxy IR/ROXICODONE) immediate release tablet 5 mg  5 mg Oral Q6H PRN Marcelyn Bruins, MD   5 mg at 03/01/20 4782  . pantoprazole (PROTONIX) EC tablet 40 mg  40 mg Oral BID Marcelyn Bruins, MD   40 mg at 02/29/20 2237  . pentafluoroprop-tetrafluoroeth (GEBAUERS) aerosol 1 application  1 application Topical PRN Gean Quint, MD      . sodium chloride flush (NS) 0.9 % injection 10 mL  10 mL Intracatheter Q8H Marcelyn Bruins, MD   10 mL at 03/01/20 0252  . sodium chloride flush (NS) 0.9 % injection 3 mL  3 mL Intravenous Q12H Marcelyn Bruins, MD   3 mL at 03/01/20 0029     ROS: As per HPI otherwise negative.  Physical Exam: Vitals:   03/01/20 0824 03/01/20 0830 03/01/20 0900 03/01/20 0930  BP: 133/61 117/71 117/71 110/66  Pulse: 96 98 (!) 101 (!) 101  Resp: 18 17 18 16   Temp:      TempSrc:      SpO2: 100% 100% 100% 100%  Weight:         General: Frail woman, sitting up, nad, on nasal oxygen  Head: NCAT sclera not icteric Neck: Supple. No JVD appreciated  Lungs: Diminished breath sounds bilaterally; left chest tube in place  Heart: Tachy, no m,r,  Abdomen: soft non-tender  Lower extremities: no sig LE edema, no open wounds  Neuro: A & O  X 3. Moves all extremities spontaneously. Psych:  Responds to questions appropriately with a normal affect. Dialysis Access: LUE AVF +bruit   Labs: Basic Metabolic Panel: Recent Labs  Lab 02/28/20 0939 02/28/20 1333  02/29/20 1537 02/29/20 1537 02/29/20 1604 02/29/20 1609 03/01/20 0609  NA 140   < > 140   < > 139 139 143  K 6.2*   < > 5.2*   < > 4.4 4.5 6.6*  CL 103   < > 103  --  110  --  102  CO2 18*  --  21*  --   --   --  23  GLUCOSE 91   < > 195*  --  143*  --  105*  BUN 135*   < > 78*  --  70*  --  96*  CREATININE 11.08*   < > 7.09*  --  6.80*  --  8.13*  CALCIUM 9.1  --  8.1*  --   --   --  8.5*  PHOS  --   --   --   --   --   --  >30.0*   < > = values in this interval not displayed.   Liver Function Tests: Recent Labs  Lab 02/28/20 0939 02/29/20 1537 03/01/20 0609  AST 17 17  --   ALT 17 17  --   ALKPHOS 51 58  --   BILITOT 0.5 0.7  --   PROT 5.8* 5.6*  --   ALBUMIN 3.1* 3.0* 2.8*   No results for input(s): LIPASE, AMYLASE in the last 168 hours. No results for input(s): AMMONIA in the last 168 hours. CBC: Recent Labs  Lab 02/28/20 0939 02/28/20 0939 02/29/20 1537 02/29/20 1537 02/29/20 1604 02/29/20 1609 03/01/20 0609  WBC 14.9*  --  13.7*  --   --   --  14.5*  NEUTROABS  --   --  12.6*  --   --   --   --   HGB 7.5*   < > 6.9*   < > 6.5* 5.8* 8.6*  HCT 25.1*   < > 22.3*   < > 19.0* 17.0* 27.3*  MCV 100.4*  --  96.1  --   --   --  96.1  PLT 169  --  132*  --   --   --  110*   < > = values in this interval not displayed.   Cardiac Enzymes: No results for input(s): CKTOTAL, CKMB, CKMBINDEX, TROPONINI in the last 168 hours. CBG: No results for input(s): GLUCAP in the last 168 hours. Iron Studies: No results for input(s): IRON, TIBC, TRANSFERRIN, FERRITIN in the last 72 hours. Studies/Results:   Dialysis Orders:  [place] MWF 4 hrs 300/800 51 kg 3.0 K/ 2.25 Ca UFP 3 Hep none L AVG -Hectorol 3 mcg IV TIW -No ESAD/T Lung mass/possible malignancy.   Assessment/Plan: 1. Left pneumothorax s/p bronchoscopy --chest tube placed per PCCM 2. Hyperkalemia  --K+ 6.6 on admission. Likely secondary to missed dialysis this week. Will correct with HD today. Follow labs.   3. ESRD -  HD MWF. Missed treatment this week. HD off schedule today d/t hyperK. Back on schedule tomorrow.  4. COPD/Chronic respiratory failure - on home O2.  5. Lung ca w brain mets- per pulm/oncology  6. Hypertension/volume  - BP is controlled. No gross volume on exam. UF to dry weight as tolerated.  7. Anemia  - Hgb 8.6 s/p 1 u prbc 10/6. Not on ESA d/t ca dx. Follow trends.  8. Metabolic bone disease - Phos >30 ?spurious result. Recheck tomorrow. Continue PhosLo binder.  9. Nutrition -Renal diet/vitamins/prot supps  Lynnda Child PA-C [facility] 03/01/2020, 9:56 AM

## 2020-03-01 NOTE — Telephone Encounter (Signed)
Called and spoke to pt's husband (on [place]) as pt is currently admitted. Informed him of the humidifier and the shower chair. Order placed for humidifier. He verbalized understanding and denied any further questions or concerns at this time.

## 2020-03-01 NOTE — Progress Notes (Signed)
Mobility Specialist - Progress Note   03/01/20 1720  Mobility  Activity Transferred to/from [facility]  Level of Assistance Minimal assist, patient does 75% or more  Assistive Device BSC  Mobility Response Tolerated well  Mobility performed by Mobility specialist  $Mobility charge 1 Mobility    Pt differed further mobility due to pain, RN aware of pain. HR in the 90s throughout.    Pricilla Handler Mobility Specialist Mobility Specialist Phone: [phone]

## 2020-03-01 NOTE — Telephone Encounter (Signed)
I followed up on Natasha [name]'s information today and she is currently in the hospital.  I called her home and then her cell phone to update her that the appt with Dr. Julien Nordmann today and that she will get a follow up call with another appt.

## 2020-03-01 NOTE — Progress Notes (Signed)
CRITICAL VALUE ALERT  Critical Value:  K+ 6.6  Date & Time Notied:  03/01/2020 @ 9794  Provider Notified: Arrien MD  Orders Received/Actions taken:

## 2020-03-01 NOTE — ED Notes (Signed)
Pt admitted to 4E26; report called to Legrand Como, RN; blood started for patient; will transport after 15 min vitals; husband, Nicole Kindred, advised of room assignment.

## 2020-03-01 NOTE — Progress Notes (Signed)
PROGRESS NOTE    Natasha [name]  ZHY:[id number] DOB: [birth year] DOA: 02/29/2020 PCP: [name], MD    Brief Narrative:  Ms. Rasheed was admitted with a working diagnosis of large left pneumothorax status post elective bronchoscopy.  74 year old female with past medical history of stage IV lung cancer, seizures, peripheral vascular disease, paroxysmal atrial fibrillation, heart failure, hypertension, COPD, history of left foot lower extremity DVT/PE, chronic anemia and end-stage renal disease on hemodialysis.  Patient underwent elective bronchoscopy for tissue sampling for stage IV lung cancer on 10/5, within 24 hours she had worsening dyspnea that prompted her to come to the hospital.  On her initial physical examination she was tachycardic 115 bpm, tachypneic 30 breaths/min, blood pressure was 295 systolic.  Her lungs had decreased breath sounds bilaterally more left than right, no wheezing, heart S1-S2, present rhythm, abdomen was soft nontender, no lower extremity edema.  Her chest radiograph showed a left pneumothorax, in the emergency department she had a left anterior pigtail catheter placement.  Assessment & Plan:   Active Problems:   Absolute anemia   ESRD on dialysis (HCC)   Emphysema/COPD (HCC)   Chronic diastolic CHF (congestive heart failure) (HCC)   COPD (chronic obstructive pulmonary disease) (HCC)   Hx of Pulmonary embolism (HCC)   Acute on chronic respiratory failure with hypoxia (HCC)   Pneumothorax   Acute respiratory failure ([place])   1. Left pneumothorax with acute hypoxemic respiratory failure. Sp elective bronchoscopy. Follow up chest film with residual left apical pneumothorax. She has significant pain with movement and deep inspiration at the left chest. Chest tube is in place and connected to suction.   Continue supplemental 02 per [place], will add as needed fentanyl for pain control and continue with oxycodone.  Radiographic follow up in am.   2.  COPD No clinical signs of exacerbation, continue with as needed bronchodilator therapy and ICS.   3. ESRD on HD. Acces left upper extremity fistula. Patient had HD today with good toleration, clinically euvolemic.  Continue with calcitriol, calcium, vitamin D and calcium acetate.   4. Stage IV lung cancer with brain metastasis. Patient follows with oncology and radiation oncology. Continue with dexamethasone 4 mg tid.  Recommendations for palliative care consultation.   5. Hx of DVT and PE. Continue anticoagulation with apixaban.   6. Hypothyroid. Continue with levothyroxine  7. HTN. Blood pressure control with metoprolol.   Patient continue to be at high risk for worsening pneumothorax and respiratory failure.   Status is: Inpatient  Remains inpatient appropriate because:Inpatient level of care appropriate due to severity of illness   Dispo: The patient is from: Home              Anticipated d/c is to: Home              Anticipated d/c date is: 3 days              Patient currently is not medically stable to d/c.   DVT prophylaxis: Enoxaparin   Code Status:   Partial   Family Communication:  I spoke with patient's  Husband  at the bedside, we talked in detail about patient's condition, plan of care and prognosis and all questions were addressed.      Consultants:   Pulmonary   Palliative care  Procedures:   Pig tube catheter to left pleural space.     Subjective: Patient with significant pain at the left chest, worse with deep inspiration and movement, no improvement  factors, no radiation, no associated nausea or vomiting,   Objective: Vitals:   03/01/20 1100 03/01/20 1130 03/01/20 1159 03/01/20 1236  BP: 90/66 113/68 139/66 105/69  Pulse: (!) 102 100 (!) 103 (!) 105  Resp: 13 14 18 18   Temp:   98.2 F (36.8 C) 98.5 F (36.9 C)  TempSrc:   Oral Oral  SpO2: 100% 100% 100% 99%  Weight:   52 kg     Intake/Output Summary (Last 24 hours) at 03/01/2020  1402 Last data filed at 03/01/2020 1159 Gross per 24 hour  Intake 250.67 ml  Output 1600 ml  Net -1349.33 ml   Filed Weights   03/01/20 0801 03/01/20 1159  Weight: 53.2 kg 52 kg    Examination:   General: evident pain and  deconditioned  Neurology: Awake and alert, non focal  E ENT: no pallor, no icterus, oral mucosa moist Cardiovascular: No JVD. S1-S2 present, rhythmic, no gallops, rubs, or murmurs. No lower extremity edema. Pulmonary: positive breath sounds bilaterally, with no wheezing, rhonchi or rales. Gastrointestinal. Abdomen soft and non tender Skin. No rashes Musculoskeletal: no joint deformities     Data Reviewed: I have personally reviewed following labs and imaging studies  CBC: Recent Labs  Lab 02/28/20 0939 02/29/20 1537 02/29/20 1604 02/29/20 1609 03/01/20 0609  WBC 14.9* 13.7*  --   --  14.5*  NEUTROABS  --  12.6*  --   --   --   HGB 7.5* 6.9* 6.5* 5.8* 8.6*  HCT 25.1* 22.3* 19.0* 17.0* 27.3*  MCV 100.4* 96.1  --   --  96.1  PLT 169 132*  --   --  573*   Basic Metabolic Panel: Recent Labs  Lab 02/28/20 0939 02/28/20 0939 02/28/20 1333 02/29/20 1537 02/29/20 1604 02/29/20 1609 03/01/20 0609  NA 140  --   --  140 139 139 143  K 6.2*   < > 3.0* 5.2* 4.4 4.5 6.6*  CL 103  --   --  103 110  --  102  CO2 18*  --   --  21*  --   --  23  GLUCOSE 91  --   --  195* 143*  --  105*  BUN 135*  --   --  78* 70*  --  96*  CREATININE 11.08*  --   --  7.09* 6.80*  --  8.13*  CALCIUM 9.1  --   --  8.1*  --   --  8.5*  PHOS  --   --   --   --   --   --  >30.0*   < > = values in this interval not displayed.   GFR: Estimated Creatinine Clearance: 5.1 mL/min (A) (by C-G formula based on SCr of 8.13 mg/dL (H)). Liver Function Tests: Recent Labs  Lab 02/28/20 0939 02/29/20 1537 03/01/20 0609  AST 17 17  --   ALT 17 17  --   ALKPHOS 51 58  --   BILITOT 0.5 0.7  --   PROT 5.8* 5.6*  --   ALBUMIN 3.1* 3.0* 2.8*   No results for input(s): LIPASE,  AMYLASE in the last 168 hours. No results for input(s): AMMONIA in the last 168 hours. Coagulation Profile: Recent Labs  Lab 02/28/20 0939 02/29/20 1644  INR 1.1 1.0   Cardiac Enzymes: No results for input(s): CKTOTAL, CKMB, CKMBINDEX, TROPONINI in the last 168 hours. BNP (last 3 results) No results for input(s): PROBNP in the last 8760 hours.  HbA1C: No results for input(s): HGBA1C in the last 72 hours. CBG: No results for input(s): GLUCAP in the last 168 hours. Lipid Profile: No results for input(s): CHOL, HDL, LDLCALC, TRIG, CHOLHDL, LDLDIRECT in the last 72 hours. Thyroid Function Tests: No results for input(s): TSH, T4TOTAL, FREET4, T3FREE, THYROIDAB in the last 72 hours. Anemia Panel: No results for input(s): VITAMINB12, FOLATE, FERRITIN, TIBC, IRON, RETICCTPCT in the last 72 hours.    Radiology Studies: I have reviewed all of the imaging during this hospital visit personally     Scheduled Meds: . sodium chloride   Intravenous Once  . apixaban  2.5 mg Oral BID  . [START ON 03/02/2020] calcitRIOL  0.25 mcg Oral Q M,W,F-HD  . calcium acetate  667 mg Oral TID WC  . Chlorhexidine Gluconate Cloth  6 each Topical Q0600  . dexamethasone  4 mg Oral TID  . fluticasone furoate-vilanterol  1 puff Inhalation Daily   And  . umeclidinium bromide  1 puff Inhalation Daily  . levothyroxine  125 mcg Oral QAC breakfast  . lidocaine  1 patch Transdermal Q24H  . metoprolol tartrate  25 mg Oral BID  . oxyCODONE      . pantoprazole  40 mg Oral BID  . sodium chloride flush  10 mL Intracatheter Q8H  . sodium chloride flush  3 mL Intravenous Q12H   Continuous Infusions:   LOS: 1 day        Natasha Jessop Gerome Apley, MD

## 2020-03-01 NOTE — Progress Notes (Signed)
  Radiation Oncology         [phone] ________________________________  Name: Natasha [name] MRN: [id number]  Date: 02/29/2020  DOB: 06/07/1945  Chart Note:  This patient was being set up to undergo stereotactic radiosurgery for multiple brain metastases.  However, her treatment was delayed due to a poor quality MRI for radiation planning due to patient motion.  The MRI was repeated without any improvement in the quality of the scan.  In the interim, she has been admitted to the hospital with a new pneumothorax in an effort to obtain tissue biopsy.  Given these changes in her clinical status and overall poor prognosis, I would recommend delaying brain treatment in order to promote a discussion around goals of care.  This patient may be appropriately served supportive care versus whole brain radiotherapy.  I would recommend palliative care consultation, if not already completed.  ________________________________  Sheral Apley Tammi Klippel, M.D.

## 2020-03-01 NOTE — Procedures (Addendum)
   I was present at this dialysis session, have reviewed the session itself and made  appropriate changes Kelly Splinter MD [place] pager [phone]   03/01/2020, 8:32 AM

## 2020-03-01 NOTE — Progress Notes (Signed)
The proposed treatment discussed in cancer conference 03/01/20 is for discussion purpose only and not a binding recommendation.  The patient was not physically examined nor present for their treatment options.  Therefore, final treatment plans cannot be decided.

## 2020-03-01 NOTE — Progress Notes (Signed)
Patient to HD

## 2020-03-02 ENCOUNTER — Encounter (HOSPITAL_COMMUNITY): Payer: Self-pay | Admitting: Pulmonary Disease

## 2020-03-02 ENCOUNTER — Inpatient Hospital Stay (HOSPITAL_COMMUNITY): Payer: Medicare Other

## 2020-03-02 DIAGNOSIS — Z66 Do not resuscitate: Secondary | ICD-10-CM | POA: Diagnosis not present

## 2020-03-02 DIAGNOSIS — Z515 Encounter for palliative care: Secondary | ICD-10-CM

## 2020-03-02 DIAGNOSIS — Z992 Dependence on renal dialysis: Secondary | ICD-10-CM

## 2020-03-02 DIAGNOSIS — J95811 Postprocedural pneumothorax: Secondary | ICD-10-CM | POA: Diagnosis not present

## 2020-03-02 DIAGNOSIS — N186 End stage renal disease: Secondary | ICD-10-CM | POA: Diagnosis not present

## 2020-03-02 DIAGNOSIS — Z7189 Other specified counseling: Secondary | ICD-10-CM

## 2020-03-02 DIAGNOSIS — J9601 Acute respiratory failure with hypoxia: Secondary | ICD-10-CM

## 2020-03-02 DIAGNOSIS — I2699 Other pulmonary embolism without acute cor pulmonale: Secondary | ICD-10-CM | POA: Diagnosis not present

## 2020-03-02 LAB — RENAL FUNCTION PANEL
Albumin: 2.6 g/dL — ABNORMAL LOW (ref 3.5–5.0)
Anion gap: 12 (ref 5–15)
BUN: 51 mg/dL — ABNORMAL HIGH (ref 8–23)
CO2: 28 mmol/L (ref 22–32)
Calcium: 8.2 mg/dL — ABNORMAL LOW (ref 8.9–10.3)
Chloride: 98 mmol/L (ref 98–111)
Creatinine, Ser: 4.98 mg/dL — ABNORMAL HIGH (ref 0.44–1.00)
GFR calc non Af Amer: 8 mL/min — ABNORMAL LOW (ref >60)
Glucose, Bld: 105 mg/dL — ABNORMAL HIGH (ref 70–99)
Phosphorus: 8.1 mg/dL — ABNORMAL HIGH (ref 2.5–4.6)
Potassium: 5.3 mmol/L — ABNORMAL HIGH (ref 3.5–5.1)
Sodium: 138 mmol/L (ref 135–145)

## 2020-03-02 LAB — TYPE AND SCREEN
ABO/RH(D): O POS
Antibody Screen: NEGATIVE
Unit division: 0

## 2020-03-02 LAB — CBC
HCT: 23.2 % — ABNORMAL LOW (ref 36.0–46.0)
Hemoglobin: 7.5 g/dL — ABNORMAL LOW (ref 12.0–15.0)
MCH: 30.4 pg (ref 26.0–34.0)
MCHC: 32.3 g/dL (ref 30.0–36.0)
MCV: 93.9 fL (ref 80.0–100.0)
Platelets: 87 10*3/uL — ABNORMAL LOW (ref 150–400)
RBC: 2.47 MIL/uL — ABNORMAL LOW (ref 3.87–5.11)
RDW: 17.1 % — ABNORMAL HIGH (ref 11.5–15.5)
WBC: 11.9 10*3/uL — ABNORMAL HIGH (ref 4.0–10.5)
nRBC: 0 % (ref 0.0–0.2)

## 2020-03-02 LAB — BPAM RBC
Blood Product Expiration Date: 202111012359
ISSUE DATE / TIME: 202110070032
Unit Type and Rh: 5100

## 2020-03-02 MED ORDER — ALTEPLASE 2 MG IJ SOLR: 2.0000 mg | Freq: Once | INTRAMUSCULAR | Status: DC | PRN

## 2020-03-02 MED ORDER — PENTAFLUOROPROP-TETRAFLUOROETH EX AERO: 1.0000 "application " | INHALATION_SPRAY | CUTANEOUS | Status: DC | PRN

## 2020-03-02 MED ORDER — LIDOCAINE-PRILOCAINE 2.5-2.5 % EX CREA
1.0000 "application " | TOPICAL_CREAM | CUTANEOUS | Status: DC | PRN
  Filled 2020-03-02: qty 5

## 2020-03-02 MED ORDER — OXYCODONE HCL 5 MG PO TABS
5.0000 mg | ORAL_TABLET | ORAL | Status: DC | PRN
  Administered 2020-03-02 – 2020-03-03 (×3): 5 mg via ORAL
  Filled 2020-03-02 (×3): qty 1

## 2020-03-02 MED ORDER — CALCITRIOL 0.25 MCG PO CAPS
ORAL_CAPSULE | ORAL | Status: AC
  Administered 2020-03-02: 0.25 ug via ORAL
  Filled 2020-03-02: qty 1

## 2020-03-02 MED ORDER — OXYCODONE HCL 5 MG PO TABS: 5.0000 mg | ORAL_TABLET | Freq: Once | ORAL | Status: DC

## 2020-03-02 MED ORDER — SODIUM CHLORIDE 0.9 % IV SOLN: 100.0000 mL | INTRAVENOUS | Status: DC | PRN

## 2020-03-02 MED ORDER — ACETAMINOPHEN 325 MG PO TABS
650.0000 mg | ORAL_TABLET | Freq: Four times a day (QID) | ORAL | Status: DC | PRN
  Administered 2020-03-03: 650 mg via ORAL
  Filled 2020-03-02: qty 2

## 2020-03-02 MED ORDER — LIDOCAINE HCL (PF) 1 % IJ SOLN: 5.0000 mL | INTRAMUSCULAR | Status: DC | PRN

## 2020-03-02 MED ORDER — HEPARIN SODIUM (PORCINE) 1000 UNIT/ML DIALYSIS: 1000.0000 [IU] | INTRAMUSCULAR | Status: DC | PRN

## 2020-03-02 MED ORDER — OXYCODONE HCL 5 MG PO TABS
ORAL_TABLET | ORAL | Status: AC
  Administered 2020-03-02: 5 mg via ORAL
  Filled 2020-03-02: qty 1

## 2020-03-02 NOTE — Progress Notes (Signed)
Mobility Specialist - Progress Note   03/02/20 1525  Mobility  Activity Contraindicated/medical hold   Pt recently pulled out CT, she is awaiting chest x-ray and is currently in pain. Will f/u as able.   Pricilla Handler Mobility Specialist Mobility Specialist Phone: [phone]

## 2020-03-02 NOTE — Progress Notes (Signed)
PROGRESS NOTE    Natasha [name]  XYV:[id number] DOB: 03-Apr-1946 DOA: 02/29/2020 PCP: [name], MD    Brief Narrative:  Natasha [name] was admitted with a working diagnosis of large left pneumothorax status post elective bronchoscopy.  74 year old female with past medical history of stage IV lung cancer, seizures, peripheral vascular disease, paroxysmal atrial fibrillation, heart failure, hypertension, COPD, history of left foot lower extremity DVT/PE, chronic anemia and end-stage renal disease on hemodialysis.  Patient underwent elective bronchoscopy for tissue sampling for stage IV lung cancer on 10/5, within 24 hours she had worsening dyspnea that prompted her to come to the hospital.  On her initial physical examination she was tachycardic 115 bpm, tachypneic 30 breaths/min, blood pressure was 286 systolic.  Her lungs had decreased breath sounds bilaterally more left than right, no wheezing, heart S1-S2, present rhythm, abdomen was soft nontender, no lower extremity edema.  Her chest radiograph showed a left pneumothorax, in the emergency department she had a left anterior pigtail catheter placement.  Pneumothorax has been improving with chest tube, first to suction and now to water seal. She has been experiencing sever chest pain related to pneumothorax.   Assessment & Plan:   Active Problems:   Absolute anemia   ESRD on dialysis (HCC)   Emphysema/COPD (HCC)   Chronic diastolic CHF (congestive heart failure) (HCC)   COPD (chronic obstructive pulmonary disease) (HCC)   Hx of Pulmonary embolism (HCC)   Acute on chronic respiratory failure with hypoxia (HCC)   Pneumothorax   Acute respiratory failure ([place])    1. Left pneumothorax (sp elective bronchoscopy) with acute hypoxemic respiratory failure. Film this am continue to re-expand left lung with only residual apical pneumothorax. Chest tube has been on water seal and today there is no air leak. Oxygenation is 99% on  3 L/min per [place]. Her pain has improved but not yet back to baseline.   Plan to remove chest tube today after HD and follow up chest film, if stable pneumothorax patient will follow up as outpatient with pulmonary.   Continue to monitor pain, her discharge will depend on how well it is controlled. Patient has received 2 doses of IV fentanyl and one dose of oxycodone today. Will increase frequency of oxycodone for now and add acetaminophen.    2. COPD No clinical exacerbation, on bronchodilator therapy and ICS.   3. ESRD on HD. Acces left upper extremity fistula. She had HD today for 2 consecutive days, now she is back on schedule M-W-F. On calcitriol, calcium, vitamin D and calcium acetate for metabolic bone disease.   4. Stage IV lung cancer with brain metastasis. Patient follows with oncology and radiation oncology.  Cytology positive for non small cell lung cancer. Continue with dexamethasone for brain mets.   Palliative care has been consulted and patient will get hospice services at discharge.    5. Hx of DVT and PE. On apixaban for anticoagulation  6. Hypothyroid. On levothyroxine  7. HTN. On metoprolol for blood pressure control.    Status is: Inpatient  Remains inpatient appropriate because:Inpatient level of care appropriate due to severity of illness   Dispo: The patient is from: Home              Anticipated d/c is to: Home              Anticipated d/c date is: 1 day              Patient currently is not medically stable  to d/c.    DVT prophylaxis: apixaban   Code Status:    partial   Family Communication:  No family at the bedside      Consultants:   Pulmonary   Nephrology   Procedures:   Left chest tube placement    Subjective: Patient continue to have persistent chest pain and headache, has received multiple times analgesics today with recurrent pain, no nausea or vomiting, no dyspnea. Had HD today.   Objective: Vitals:   03/02/20 1130  03/02/20 1200 03/02/20 1229 03/02/20 1306  BP: 126/77 140/75 (!) 153/74 (!) 147/70  Pulse: (!) 101     Resp:   14 19  Temp:    98.7 F (37.1 C)  TempSrc:    Oral  SpO2:    99%  Weight:        Intake/Output Summary (Last 24 hours) at 03/02/2020 1316 Last data filed at 03/02/2020 1229 Gross per 24 hour  Intake 130 ml  Output 1220 ml  Net -1090 ml   Filed Weights   03/01/20 0801 03/01/20 1159 03/02/20 0810  Weight: 53.2 kg 52 kg 55.1 kg    Examination:   General: Not in pain or dyspnea, deconditioned  Neurology: Awake and alert, non focal  E ENT: no pallor, no icterus, oral mucosa moist Cardiovascular: No JVD. S1-S2 present, rhythmic, no gallops, rubs, or murmurs. No lower extremity edema. Pulmonary: positive breath sounds bilaterally, decreased air movement, no wheezing or rhonchi, scattered bilateral rales. Gastrointestinal. Abdomen soft and non tender Skin. No rashes Musculoskeletal: no joint deformities     Data Reviewed: I have personally reviewed following labs and imaging studies  CBC: Recent Labs  Lab 02/28/20 0939 02/28/20 0939 02/29/20 1537 02/29/20 1604 02/29/20 1609 03/01/20 0609 03/02/20 1005  WBC 14.9*  --  13.7*  --   --  14.5* 11.9*  NEUTROABS  --   --  12.6*  --   --   --   --   HGB 7.5*   < > 6.9* 6.5* 5.8* 8.6* 7.5*  HCT 25.1*   < > 22.3* 19.0* 17.0* 27.3* 23.2*  MCV 100.4*  --  96.1  --   --  96.1 93.9  PLT 169  --  132*  --   --  110* 87*   < > = values in this interval not displayed.   Basic Metabolic Panel: Recent Labs  Lab 02/28/20 0939 02/28/20 1333 02/29/20 1537 02/29/20 1604 02/29/20 1609 03/01/20 0609 03/02/20 0059  NA 140   < > 140 139 139 143 138  K 6.2*   < > 5.2* 4.4 4.5 6.6* 5.3*  CL 103  --  103 110  --  102 98  CO2 18*  --  21*  --   --  23 28  GLUCOSE 91  --  195* 143*  --  105* 105*  BUN 135*  --  78* 70*  --  96* 51*  CREATININE 11.08*  --  7.09* 6.80*  --  8.13* 4.98*  CALCIUM 9.1  --  8.1*  --   --  8.5* 8.2*   PHOS  --   --   --   --   --  >30.0* 8.1*   < > = values in this interval not displayed.   GFR: Estimated Creatinine Clearance: 8.7 mL/min (A) (by C-G formula based on SCr of 4.98 mg/dL (H)). Liver Function Tests: Recent Labs  Lab 02/28/20 0939 02/29/20 1537 03/01/20 0609 03/02/20 0059  AST 17  17  --   --   ALT 17 17  --   --   ALKPHOS 51 58  --   --   BILITOT 0.5 0.7  --   --   PROT 5.8* 5.6*  --   --   ALBUMIN 3.1* 3.0* 2.8* 2.6*   No results for input(s): LIPASE, AMYLASE in the last 168 hours. No results for input(s): AMMONIA in the last 168 hours. Coagulation Profile: Recent Labs  Lab 02/28/20 0939 02/29/20 1644  INR 1.1 1.0   Cardiac Enzymes: No results for input(s): CKTOTAL, CKMB, CKMBINDEX, TROPONINI in the last 168 hours. BNP (last 3 results) No results for input(s): PROBNP in the last 8760 hours. HbA1C: No results for input(s): HGBA1C in the last 72 hours. CBG: No results for input(s): GLUCAP in the last 168 hours. Lipid Profile: No results for input(s): CHOL, HDL, LDLCALC, TRIG, CHOLHDL, LDLDIRECT in the last 72 hours. Thyroid Function Tests: No results for input(s): TSH, T4TOTAL, FREET4, T3FREE, THYROIDAB in the last 72 hours. Anemia Panel: No results for input(s): VITAMINB12, FOLATE, FERRITIN, TIBC, IRON, RETICCTPCT in the last 72 hours.    Radiology Studies: I have reviewed all of the imaging during this hospital visit personally     Scheduled Meds:  sodium chloride   Intravenous Once   apixaban  2.5 mg Oral BID   calcitRIOL  0.25 mcg Oral Q M,W,F-HD   calcium acetate  667 mg Oral TID WC   Chlorhexidine Gluconate Cloth  6 each Topical Q0600   dexamethasone  4 mg Oral TID   fluticasone furoate-vilanterol  1 puff Inhalation Daily   And   umeclidinium bromide  1 puff Inhalation Daily   levothyroxine  125 mcg Oral QAC breakfast   lidocaine  1 patch Transdermal Q24H   metoprolol tartrate  25 mg Oral BID   pantoprazole  40 mg Oral  BID   sodium chloride flush  10 mL Intracatheter Q8H   sodium chloride flush  3 mL Intravenous Q12H   Continuous Infusions:   LOS: 2 days        Janney Priego Gerome Apley, MD

## 2020-03-02 NOTE — Progress Notes (Signed)
During the night Natasha [name] had periods of forgetfulness. Patient frequently called to make duplicate request for task that had already been fulfilled.

## 2020-03-02 NOTE — Consult Note (Signed)
Palliative Medicine Inpatient Consult Note  Reason for consult:  GOC Discussion  "Stg IV Lung CA"  HPI:  Per intake H&P --> Natasha [name] is a 74 y.o. female with medical history significant of stage IV lung cancer, seizures, PVD, paroxysmal A. 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 She has chronic SOB that has been worse for a bout a month but became significantly worse in the last day and continued worsen.  Palliative care was asked to get involved to continue goals of care conversations in the setting of metastatic Stg IV lung CA. Natasha [name] is known to our service as she was seen 9/24 - 9/26 at that point in time she had elected to have bronchoscopy to further identify her cancer in an attempt to better understand treatment options. She has her bronchoscopy and thereafter experienced worsening shortness of breath in the setting of a left pneumothorax. She now has a chest tube in place.  Kiernan and I had completed a MOST on 9/24 though per review of code status it now says she would be willing to get intubated - this will need to be clarified.    Clinical Assessment/Goals of Care: I have reviewed medical records including EPIC notes, labs and imaging, received report from bedside RN, assessed the patient who is alert and oriented.   I met with Natasha [name] to furtherdiscuss diagnosis prognosis, GOC, EOL wishes, disposition and options.  I introduced Palliative Medicine as specialized medical care for people living with serious illness. It focuses on providing relief from the symptoms and stress of a serious illness. The goal is to improve quality of life for both the patient and the family.  As per prior historical review --> She shares that she is from [place]. She has lived in [place] for the greater portion of her life. She has  been married to her husband, Natasha [name] for the past 29 five years. She has three children two sons and a daughter. 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 She enjoys cooking, spending time with her family, and sewing garments. She is a faithful woman and practices within the [facility] denomination.   In terms of mobility Natasha [name] shares that she has been able to participate in all bADLs. She has been able to bath, dress herself, cook and feed herself. She attests to needing a walker and recently got a rollator.  Natasha [name] states that since going home she was [name] fairly. She came in for her bronchoscopy on Tuesday and things have been "downhill" since. She states that she was "feeling jumbled" and having a harder time breathing. She explains that she now has a "tube in her chest" to help with this. She overall vocalizes improvements from that perspective.   Natasha [name] and her husband alike are interested in knowing more about her cancer and what treatment options would be available.   A detailed discussion was had today regarding advanced directives - there are none on file. Patient would defer to her husband Natasha [name] to make decisions for her if needed.  The difference between a aggressive medical intervention path and a palliative comfort care path for this patient at this time was had. We discussed the reality that her cancer is advanced and that additional therapies may help  and offer her more time though there would be a trade off in terms of tolerance to those as they can often make patients possibly feel worse.   I again introduced the topic of hospice care.  I described hospice as a service for patients who have a life expectancy of < 6 months. It preserves dignity and quality at the end phases of life.   I was able to review and explain the hospice benefit to [facility]  I shared that on hospice  she would need to stop hemodialysis and this would make her life expectancy shorter we talked about 10-14 days in duration. I shared that hospice shifts the focus from curative to symptom based.    Natasha [name] states that she has been thinking more and more about hospice. She states that she and her husband have been talking about this as well. I offered to request that one  Of the local hospice liaisons meet with she and her husband to better explain their services which she is open to.   I reviewed her prior MOST form today as below:  It should be noted that she would not  would not want intubation or CPR.   Cardiopulmonary Resuscitation: Do Not Attempt Resuscitation (DNR/No CPR)  Medical Interventions: Limited Additional Interventions: Use medical treatment, IV fluids and cardiac monitoring as indicated, DO NOT USE intubation or mechanical ventilation. May consider use of less invasive airway support such as BiPAP or CPAP. Also provide comfort measures. Transfer to the hospital if indicated. Avoid intensive care.   Antibiotics: Antibiotics if indicated  IV Fluids: IV fluids if indicated  Feeding Tube: Feeding tube for a defined trial period   Discussed the importance of continued conversation with family and their  medical providers regarding overall plan of care and treatment options, ensuring decisions are within the context of the patients values and GOCs.  Decision Maker: Patient can make decisions for herself.  SUMMARY OF RECOMMENDATIONS   DNAR/DNI  TOC --> Request consult for hospice patient feels she is getting closer to this and would like to learn more about in home hospice versus inpatient hospice. Patient prior consulted by [place]  Code Status/Advance Care Planning: DNAR/DNI   Symptom Management:  Metastatic Pain:  - Continue oxycodone 14m PO Q6H  - Continue Decadron 480mPO TID  - Continue Lidoderm patch   Palliative Prophylaxis:    Oral Care  Additional Recommendations (Limitations, Scope, Preferences):  Treat what it treatable   Psycho-social/Spiritual:   Desire for further Chaplaincy support: Yes  Additional Recommendations: Education on hospice   Prognosis: Exceptionally poor in the setting of metastatic disease - months  Discharge Planning: Discharge plan unclear  PPS: 30%   This conversation/these recommendations were discussed with patient primary care team, Dr. ArCathlean SauerTime In: [id number] Out: 0800AM Total Time: 75 Greater than 50%  of this time was spent counseling and coordinating care related to the above assessment and plan.  [facility] Team Cell Phone: [phone] utilize secure chat with additional questions, if there is no response within 30 minutes please call the above phone number  Palliative Medicine Team providers are available by phone from 7am to 7pm daily and can be reached through the team cell phone.  Should this patient require assistance outside of these hours, please call the patient's attending physician.

## 2020-03-02 NOTE — Progress Notes (Signed)
[facility] Progress Note  Subjective: seen on HD today  Vitals:   03/02/20 1200 03/02/20 1229 03/02/20 1306 03/02/20 1448  BP: 140/75 (!) 153/74 (!) 147/70 112/72  Pulse:      Resp:  14 19 20   Temp:   98.7 F (37.1 C)   TempSrc:   Oral   SpO2:   99% 98%  Weight:        Exam: General: Frail woman, sitting up, nad, on nasal oxygen  Head: NCAT sclera not icteric Neck: Supple. No JVD appreciated  Lungs: Diminished breath sounds bilaterally; left chest tube  Heart: Tachy, no m,r,  Abdomen: soft non-tender  Lower extremities: +bilat LE 2+ edema, no open wounds  Neuro: A & O  X 3. Moves all extremities spontaneously. Dialysis Access: LUE AVF +bruit    Dialysis: [facility] MWF   4h 300/800 51kg 3K/2.25 bath  P3Hep none L AVG -Hectorol 3 mcg IV TIW -No ESAD/T Lung mass/possible malignancy.   Assessment/Plan: 1. Left pneumothorax s/p bronchoscopy --chest tube per CCM 2. Hyperkalemia  --K+ 6.6 on admission. Likely secondary to missed dialysis this week. K+ 5.3 today.  3. ESRD -  HD MWF. Missed treatment this week. Had HD here yest. HD today to get back on schedule.  4. COPD/Chronic respiratory failure - on home O2.  5. Lung ca w brain mets- per pulm/oncology  6. Hypertension/volume  - BP is controlled. Up 3kg, sig LE edema, lower vol as tol w/ HD 7. Anemia  - Hgb 8.6 s/p 1 u prbc 10/6. Not on ESA d/t ca dx. Follow trends.  8. Metabolic bone disease - Phos >30 ?spurious result. Recheck tomorrow. Continue PhosLo binder.  9. Nutrition -Renal diet/vitamins/prot supps     Rob Deiontae Rabel 03/02/2020, 3:18 PM   Recent Labs  Lab 03/01/20 0609 03/02/20 0059 03/02/20 1005  K 6.6* 5.3*  --   BUN 96* 51*  --   CREATININE 8.13* 4.98*  --   CALCIUM 8.5* 8.2*  --   PHOS >30.0* 8.1*  --   HGB 8.6*  --  7.5*   Inpatient medications: . sodium chloride   Intravenous Once  . apixaban  2.5 mg Oral BID  . calcitRIOL  0.25 mcg Oral Q M,W,F-HD  . calcium acetate  667  mg Oral TID WC  . Chlorhexidine Gluconate Cloth  6 each Topical Q0600  . dexamethasone  4 mg Oral TID  . fluticasone furoate-vilanterol  1 puff Inhalation Daily   And  . umeclidinium bromide  1 puff Inhalation Daily  . levothyroxine  125 mcg Oral QAC breakfast  . lidocaine  1 patch Transdermal Q24H  . metoprolol tartrate  25 mg Oral BID  . pantoprazole  40 mg Oral BID  . sodium chloride flush  10 mL Intracatheter Q8H  . sodium chloride flush  3 mL Intravenous Q12H    acetaminophen, albuterol, calcium-vitamin D, fentaNYL (SUBLIMAZE) injection, ipratropium-albuterol, nitroGLYCERIN, ondansetron, oxyCODONE

## 2020-03-02 NOTE — Progress Notes (Signed)
NAME:  Natasha [name], MRN:  [id number], DOB:  [birth year], LOS: 2 ADMISSION DATE:  02/29/2020, CONSULTATION DATE:  02/29/2020 REFERRING MD:  Dr. Ralene Bathe, CHIEF COMPLAINT:  Pneumothorax   Brief History   74yo female presented with progressive SOB during HD treatment. Of note patient underwent elective bronchoscopy for tissue sampling of stage IV lung cancer with Dr. Valeta Harms 10/5. On admission she is seen with large left pneumothorax requiring pigtail chest tube insertion in ED.   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 Patient reported the SOB began last night and has progressive worsened. The SOB is worse when lying flat. She denies any alleviating factors. She also denies any fever, chills, cough, chest pain, or abdominal pain.  Of note patient underwent elective bronchoscopy with Dr. Valeta Harms 10/5 for tissue sampling in the setting of stage IV lung cancer. Patient had originally opted not to treat lung mass but later decided to purse workup this year. Given significant history of COPD patient and clinical team deferred bronchoscopy in attempt to obtain tissue sampling from spinal lesion however this was non-diagnostic and this resulted in need of bronchoscopy.   On arrival patient was seen tachycardiac, tachypnic, and mildly hypertensive. Lab work significant for K 5.2, Creatinine 7.09, calcium 8.1, BNP 669.3, WBC 13.7, hgb 6.9, and hct 22.3. CXR revealed large left pneumothorax. 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 Known left mid lung zone mass not well visualized.   Micro Data:    Antimicrobials:     Interim history/subjective:   Feels well on room air Currently getting hemodialysis Still some coughing, producing some mucus, sometimes blood-tinged  Objective   Blood pressure 126/77, pulse (!) 101, temperature 98.4 F (36.9 C), temperature source Oral, resp. rate 17, weight 55.1 kg, SpO2 99 %.        Intake/Output Summary (Last 24 hours) at 03/02/2020 1152 Last data filed at 03/01/2020 1843 Gross per 24 hour  Intake 130 ml  Output 1726 ml  Net -1596 ml   Filed Weights   03/01/20 0801 03/01/20 1159 03/02/20 0810  Weight: 53.2 kg 52 kg 55.1 kg    Examination: General: Comfortable elderly woman, laying in bed, dialysis running HEENT: Oropharynx clear, no stridor, no LAD Neuro: Awake, alert, appropriate, following commands and moving all extremities CV: Regular, no murmur PULM: Soft breath sounds were clear, no crackles, no wheezes.  Left pigtail chest tube in place on waterseal.  No air leak with deep inspiration or with a cough GI: Nondistended, positive bowel sounds Extremities: Warm dry, left AV graft Skin: No rash  Chest x-ray 03/02/2020 reviewed by me shows no change in a small left apical pneumothorax (on waterseal overnight)  Resolved Hospital Problem list     Assessment & Plan:  Large  left iatrogenic pneumothorax from bronchoscopy 10/5 Stage IV lung cancer Chronic hypoxic respiratory failure  -Baseline requires 3Lnc Continuously  Severe COPD with underlying emphysema  -GOLDstage III-IV;PFTs in November 2020 showed FEV1 0.57 (35%), ratio 37. P: -We can remove her chest tube today 10/6 when she is finished with dialysis and back  on the regular unit. -Check chest x-ray about 1 hour after tube removed to ensure no recurrence of pneumothorax.  If none present then she should be able to be discharged home if no other medical issues keeping her here -Cytology from 10/5 consistent with non-small cell lung cancer.  Final differentiation is still pending.  Follow-up final results   Best practice:  Diet: Regular  Pain/Anxiety/Delirium protocol (if indicated): PRns VAP protocol (if indicated): N/A DVT prophylaxis: Resume home eliquis  GI prophylaxis: PPI Glucose control: SSI Mobility: Up with assistance  Code Status: DNR Family Communication: Patient updated in detail 10/8 Disposition: Floor   Labs   CBC: Recent Labs  Lab 02/28/20 0939 02/28/20 0939 02/29/20 1537 02/29/20 1604 02/29/20 1609 03/01/20 0609 03/02/20 1005  WBC 14.9*  --  13.7*  --   --  14.5* 11.9*  NEUTROABS  --   --  12.6*  --   --   --   --   HGB 7.5*   < > 6.9* 6.5* 5.8* 8.6* 7.5*  HCT 25.1*   < > 22.3* 19.0* 17.0* 27.3* 23.2*  MCV 100.4*  --  96.1  --   --  96.1 93.9  PLT 169  --  132*  --   --  110* 87*   < > = values in this interval not displayed.    Basic Metabolic Panel: Recent Labs  Lab 02/28/20 0939 02/28/20 1333 02/29/20 1537 02/29/20 1604 02/29/20 1609 03/01/20 0609 03/02/20 0059  NA 140   < > 140 139 139 143 138  K 6.2*   < > 5.2* 4.4 4.5 6.6* 5.3*  CL 103  --  103 110  --  102 98  CO2 18*  --  21*  --   --  23 28  GLUCOSE 91  --  195* 143*  --  105* 105*  BUN 135*  --  78* 70*  --  96* 51*  CREATININE 11.08*  --  7.09* 6.80*  --  8.13* 4.98*  CALCIUM 9.1  --  8.1*  --   --  8.5* 8.2*  PHOS  --   --   --   --   --  >30.0* 8.1*   < > = values in this interval not displayed.   GFR: Estimated Creatinine Clearance: 8.7 mL/min (A) (by C-G formula based on SCr of 4.98 mg/dL (H)). Recent Labs  Lab 02/28/20 0939 02/29/20 1537 03/01/20 0609 03/02/20 1005  WBC 14.9* 13.7* 14.5* 11.9*    Liver Function  Tests: Recent Labs  Lab 02/28/20 0939 02/29/20 1537 03/01/20 0609 03/02/20 0059  AST 17 17  --   --   ALT 17 17  --   --   ALKPHOS 51 58  --   --   BILITOT 0.5 0.7  --   --   PROT 5.8* 5.6*  --   --   ALBUMIN 3.1* 3.0* 2.8* 2.6*   No results for input(s): LIPASE, AMYLASE in the last 168 hours. No results for input(s): AMMONIA in the last 168 hours.  ABG    Component Value Date/Time   PHART 7.350 04/15/2019 1436   PCO2ART 40.6 04/15/2019  1436   PO2ART 121 (H) 04/15/2019 1436   HCO3 18.5 (L) 02/29/2020 1609   TCO2 19 (L) 02/29/2020 1609   ACIDBASEDEF 4.0 (H) 02/29/2020 1609   O2SAT 100.0 02/29/2020 1609     Coagulation Profile: Recent Labs  Lab 02/28/20 0939 02/29/20 1644  INR 1.1 1.0    Baltazar Apo, MD, PhD 03/02/2020, 12:00 PM [facility] [phone] or if no answer [phone]

## 2020-03-02 NOTE — Progress Notes (Signed)
Pt pulled pigtail CT while transferring to the bedside commode. RN went in, help pressure, placed occlusive and gauze over incision site. Pt ST, HR in 130-150s. MD notified. Okay to get chest x-ray at 3p if pt is asymptomatic. Will notified primary physician as well and continue to monitor pt.

## 2020-03-02 NOTE — Progress Notes (Signed)
PCCM Interval Note  Notified that patient accidentally pulled her pigtail chest tube. She had been tolerating water seal and CXR this am was stable.   Repeat CXR this pm with apical PTX, a bit larger than on morning film  Recommend watching her overnight, repeating CXR in the am 10/9 for stability. If PTX has not enlarged then can consider d/c to home.   Baltazar Apo, MD, PhD 03/02/2020, 3:25 PM  [facility]ulmonary [facility] Critical Care [phone] or if no answer [phone]

## 2020-03-02 NOTE — Progress Notes (Signed)
Manufacturing engineer [facility]) Community Based Palliative Care    Was notified by [facility] manager, Marvetta Gibbons, RN that patient/family is interested in learning more about hospice vs palliative care in the home. Visited at bedside, patient had just returned from dialysis and did not feel like talking. I left an [facility] packet with information on additional services with husband.      This patient is currently enrolled in our palliative care services in the community.  [facility] will continue to follow for any discharge planning needs and to coordinate continuation of palliative care.    If you have questions or need assistance, please call [phone] or contact the hospital Liaison listed on AMION.        Farrel Gordon, RN, [facility] Liaison   [phone]

## 2020-03-03 ENCOUNTER — Inpatient Hospital Stay (HOSPITAL_COMMUNITY): Payer: Medicare Other

## 2020-03-03 DIAGNOSIS — J9601 Acute respiratory failure with hypoxia: Secondary | ICD-10-CM | POA: Diagnosis not present

## 2020-03-03 DIAGNOSIS — Z7189 Other specified counseling: Secondary | ICD-10-CM | POA: Diagnosis not present

## 2020-03-03 DIAGNOSIS — N186 End stage renal disease: Secondary | ICD-10-CM | POA: Diagnosis not present

## 2020-03-03 DIAGNOSIS — D631 Anemia in chronic kidney disease: Secondary | ICD-10-CM

## 2020-03-03 DIAGNOSIS — J449 Chronic obstructive pulmonary disease, unspecified: Secondary | ICD-10-CM | POA: Diagnosis not present

## 2020-03-03 DIAGNOSIS — Z66 Do not resuscitate: Secondary | ICD-10-CM | POA: Diagnosis not present

## 2020-03-03 DIAGNOSIS — J95811 Postprocedural pneumothorax: Secondary | ICD-10-CM

## 2020-03-03 DIAGNOSIS — Z515 Encounter for palliative care: Secondary | ICD-10-CM | POA: Diagnosis not present

## 2020-03-03 MED ORDER — OXYCODONE HCL 5 MG PO TABS
5.0000 mg | ORAL_TABLET | ORAL | 0 refills | Status: AC | PRN
Start: 2020-03-03 — End: ?

## 2020-03-03 NOTE — Progress Notes (Signed)
Manufacturing engineer [facility]) liaison note  Met with Mrs. Vanzanten at bedside.  Unfortunately spouse [name] was not present for the discussion.  Mrs. Kathman shared about her health journey and about her fatigue and recent lack of energy. She described an episode yesterday at HD which was upsetting for her.  She also shared that her husband is not yet ready for her to stop "fighting."  At he end of the discussion Mrs. Dossett's shared that she wants and is willing to do dialysis at least one more time on Monday.  Information left at bedside on referring out of hospital to hospice and contact number left for further discussion if desired by family.  Care team updated.  Thank you for the opportunity to participate in this pt's care.  Domenic Moras, BSN, RN [facility] Liaison [phone] (24 h on call)

## 2020-03-03 NOTE — Discharge Summary (Addendum)
Physician Discharge Summary  Natasha [name]:[id number] DOB: 1946-04-08 DOA: 02/29/2020  PCP: [name], MD  Admit date: 02/29/2020 Discharge date: 03/03/2020  Admitted From: home  Disposition:  Home with hospice   Recommendations for Outpatient Follow-up and new medication changes:  1. Follow up with Dr. Fara Olden in 7 days.  2. Patient will be followed by hospice at home. 3. Follow up with pulmonary clinic in 7 days, with follow up chest film.   Home Health: na   Equipment/Devices: na    Discharge Condition: stable  CODE STATUS: partial   Diet recommendation:  Heart healthy   Brief/Interim Summary: Mrs. Franklinwas admitted with a working diagnosis of acute large left pneumothorax as a complication of elective diagnostic bronchoscopy.  74 year old female with past medical history of stage IV lung cancer, seizures, peripheral vascular disease, paroxysmal atrial fibrillation, heart failure, hypertension, COPD, history of left foot lower extremity DVT/PE, chronic anemia and end-stage renal disease on hemodialysis. Patient underwent elective bronchoscopy for tissue sampling for stage IV lung cancer on 10/5, within 24 hours she had worsening dyspnea that prompted her to come to the hospital. On her initial physical examination she was tachycardic 115 bpm, tachypneic 30 breaths/min, blood pressure was 559 systolic. Her lungs had decreased breath sounds bilaterally more decreased left than right, no wheezing, heart S1-S2, present rhythm, abdomen was soft nontender, no lower extremity edema. Sodium 140, potassium 5.2, chloride 103, bicarb 21, glucose 195, BUN 78, creatinine 7.0, troponin I 63, white count 13.7, hemoglobin 6.9, hematocrit 22.3, platelets 132.  SARS COVID-19 negative.  Her chest radiograph showed a left pneumothorax, in the emergency department she had a left anterior chest pigtail catheter placement.  Pneumothorax has been improving with chest tube,  first to suction and then to water seal. She had severe chest pain related to pneumothorax.  Chest tube was removed, follow chest film with improvement of pneumothorax. Patient with poor prognosis and advance cancer, palliative care was consulted and patient will have hospice services at discharge.   1.  Left pneumothorax, as a complication of diagnostic elective bronchoscopy.  Acute on chronic hypoxic respiratory failure.  Patient was admitted to the progressive care unit, her symptoms improved with chest tube placement, pigtail catheter.  Pain was controlled with opiate analgesics and she received supplemental oxygen per nasal cannula.  After appropriate lung expansion chest tube was transitioned from suction to waterseal and then removed.  Her chest radiograph from this morning shows expansion of left lung with small residual apical pneumothorax , and patient's pain and dyspnea had significantly improved.  Oxygenation discharge is 99% on 3 L of supplemental oxygen per nasal cannula.  Follow up with pulmonary clinic in 7 days.  2.  Stage IV lung cancer with brain metastasis.  Patient follows up with oncology and radiation oncology as an outpatient.  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   Initially scheduled to have stereotactic radiosurgery for multiple brain metastasis.  Unfortunately brain MRI images with poor quality.  Currently her treatment has been delayed due to current hospitalization.  Due to poor prognosis it was recommended further palliative care consultation. Patient will be discharged with hospice services.  3.  COPD.  No signs of acute exacerbation, continue bronchodilator therapy and inhaled corticosteroids.  4.  End-stage renal disease on hemodialysis/ hyperkalemia.  Patient underwent hemodialysis during her hospitalization.  She has a left upper extremity fistula as access.  Her next session will be Monday per her regular schedule. She did experience  hypotension  after ultrafiltration. Continue calcitriol, calcium, vitamin D and calcium acetate for metabolic bone disease. Discharge K is 5,3 with P 8,1.   5.  History of DVT/PE.  Patient will continue antithrombotic therapy with apixaban, renally dose.  6.  Hypothyroidism.  Continue levothyroxine.  7.  Hypertension.  Patient is on metoprolol and on midodrine.  8.  Chronic anemia, anemia of chronic kidney disease.  Patient had worsening anemia with a hemoglobin of 6.9, she received 1 unit packed red blood cell transfusion, her discharge hemoglobin is 7.5 with hematocrit 23.2.  Discharge Diagnoses:  Principal Problem:   Pneumothorax Active Problems:   Absolute anemia   ESRD on dialysis (HCC)   Emphysema/COPD (HCC)   Chronic diastolic CHF (congestive heart failure) (HCC)   COPD (chronic obstructive pulmonary disease) (HCC)   Hx of Pulmonary embolism (HCC)   Acute on chronic respiratory failure with hypoxia (HCC)   Acute respiratory failure ([place])    Discharge Instructions   Allergies as of 03/03/2020      Reactions   Mercaptopurine Other (See Comments)   Caused pancreatitis   Remicade [infliximab] Other (See Comments)   CAUSED JOINT PAIN      Medication List    STOP taking these medications   diltiazem 30 MG tablet Commonly known as: CARDIZEM     TAKE these medications   acetaminophen 500 MG tablet Commonly known as: TYLENOL Take 1,000 mg by mouth every 6 (six) hours as needed for mild pain.   albuterol 108 (90 Base) MCG/ACT inhaler Commonly known as: VENTOLIN HFA INHALE 2 PUFFS BY MOUTH EVERY 6 HOURS IF NEEDED FOR WHEEZING OR SHORTNESS OF BREATH What changed:   how much to take  how to take this  when to take this  reasons to take this   calcitRIOL 0.25 MCG capsule Commonly known as: ROCALTROL Take 1 capsule (0.25 mcg total) by mouth every Monday, Wednesday, and Friday with hemodialysis.   calcium acetate 667 MG capsule Commonly known as: PHOSLO Take 1 capsule  (667 mg total) by mouth 3 (three) times daily with meals.   calcium-vitamin D 500-200 MG-UNIT Tabs tablet Commonly known as: OSCAL WITH D Take 1 tablet by mouth as needed (with dialysis).   cyanocobalamin 1000 MCG/ML injection Commonly known as: (VITAMIN B-12) Inject 1,000 mcg into the muscle every 30 (thirty) days.   dexamethasone 4 MG tablet Commonly known as: DECADRON Take 1 tablet (4 mg total) by mouth 3 (three) times daily.   Eliquis 5 MG Tabs tablet Generic drug: apixaban Take 2.5 mg by mouth 2 (two) times daily. Takes 1/2 tablet (2.76m) bid   fluticasone 50 MCG/ACT nasal spray Commonly known as: FLONASE Place 1 spray into both nostrils daily as needed for allergies.   Humira 40 MG/0.8ML Pskt Generic drug: Adalimumab Inject 40 mg into the skin every 14 (fourteen) days.   ipratropium-albuterol 0.5-2.5 (3) MG/3ML Soln Commonly known as: DUONEB Take 3 mLs by nebulization every 6 (six) hours as needed. What changed: reasons to take this   levothyroxine 125 MCG tablet Commonly known as: SYNTHROID Take 125 mcg by mouth daily before breakfast.   lidocaine-prilocaine cream Commonly known as: EMLA Apply 1 application topically every Monday, Wednesday, and Friday.   LORazepam 0.5 MG tablet Commonly known as: ATIVAN Take 2 tablets (1 mg total) by mouth as needed for anxiety (30 minutes prior to Radiation treatment(s)).   metoprolol tartrate 25 MG tablet Commonly known as: LOPRESSOR Take 25 mg by mouth 2 (two) times daily.  midodrine 10 MG tablet Commonly known as: PROAMATINE Take 1 tablet (10 mg total) by mouth 3 (three) times daily with meals.   Nebulizer Devi 1 Device by Does not apply route as needed.   nitroGLYCERIN 0.4 MG SL tablet Commonly known as: NITROSTAT Place 1 tablet (0.4 mg total) under the tongue every 5 (five) minutes as needed for chest pain.   ondansetron 4 MG disintegrating tablet Commonly known as: Zofran ODT Take 1 tablet (4 mg total) by  mouth every 8 (eight) hours as needed for nausea or vomiting.   oxyCODONE 5 MG immediate release tablet Commonly known as: Oxy IR/ROXICODONE Take 1 tablet (5 mg total) by mouth every 4 (four) hours as needed for severe pain. What changed: reasons to take this   OXYGEN Inhale 5 L into the lungs See admin instructions. Use every night and during the day as needed for shortness of breath   pantoprazole 40 MG tablet Commonly known as: Protonix Take 1 tablet (40 mg total) by mouth 2 (two) times daily.   sorbitol 70 % Soln Take 30 mLs by mouth daily as needed for moderate constipation.   sorbitol 70 % solution Take 15 mLs by mouth daily as needed.   Trelegy Ellipta 100-62.5-25 MCG/INH Aepb Generic drug: Fluticasone-Umeclidin-Vilant Inhale 1 puff into the lungs daily.            Discharge Care Instructions  (From admission, onward)         Start     Ordered   03/03/20 0000  Leave dressing on - Keep it clean, dry, and intact until clinic visit        03/03/20 1000          Follow-up Information    [name], MD Follow up in 1 week(s).   Specialty: Internal Medicine Contact information: [address] [place] [address] [phone]              Allergies  Allergen Reactions  . Mercaptopurine Other (See Comments)    Caused pancreatitis  . Remicade [Infliximab] Other (See Comments)    CAUSED JOINT PAIN    Consultations:  Pulmonary   Nephrology    Procedures/Studies: DG Chest 2 View  Result Date: 02/28/2020 CLINICAL DATA:  Dyspnea, productive cough, pre-bronchoscopy EXAM: CHEST - 2 VIEW COMPARISON:  01/30/2020 chest radiograph. FINDINGS: Vascular stent overlies the left axilla. Stable cardiomediastinal silhouette with top-normal heart size. No pneumothorax. No pleural effusion. Left perihilar 2.7 cm nodule is unchanged. No overt pulmonary edema. IMPRESSION: Stable left perihilar 2.7 cm nodule. No acute cardiopulmonary disease.  Electronically Signed   By: Ilona Sorrel M.D.   On: 02/28/2020 09:05   CT Head Wo Contrast  Result Date: 02/15/2020 CLINICAL DATA:  Headache.  Lung cancer. EXAM: CT HEAD WITHOUT CONTRAST TECHNIQUE: Contiguous axial images were obtained from the base of the skull through the vertex without intravenous contrast. COMPARISON:  MRI 12/01/2019 FINDINGS: Brain: There is an area of low-density noted in the right parietal subcortical white matter, new since prior MRI. Given history of lung cancer, cannot exclude metastasis in this region. No hemorrhage or hydrocephalus. Vascular: No hyperdense vessel or unexpected calcification. Skull: No acute calvarial abnormality. Sinuses/Orbits: Visualized paranasal sinuses and mastoids clear. Orbital soft tissues unremarkable. Other: None IMPRESSION: Area of subcortical low-density in the right parietal lobe. Cannot exclude focal lesion/metastasis. Consider further evaluation with MRI with and without contrast. Electronically Signed   By: Rolm Baptise M.D.   On: 02/15/2020 11:42  CT CHEST WO CONTRAST  Result Date: 02/29/2020 CLINICAL DATA:  Pneumothorax EXAM: CT CHEST WITHOUT CONTRAST TECHNIQUE: Multidetector CT imaging of the chest was performed following the standard protocol without IV contrast. COMPARISON:  Radiograph same day FINDINGS: Cardiovascular: Aortic atherosclerosis is noted. There are coronary artery calcifications present. The heart size is normal. There is no pericardial thickening or effusion. A left axillary probable subclavian stent is seen. Mediastinum/Nodes: There are no enlarged mediastinal, hilar or axillary lymph nodes. The thyroid gland, trachea and esophagus demonstrate no significant findings. Lungs/Pleura: There is a small to moderate left-sided pneumothorax. A anterior left-sided pigtail catheter is seen with the tip projecting at the left lung base there is a surrounding area of consolidation/contusion around the pleural catheter at the base. Again  noted is an anterior left upper lobe 3.5 cm pulmonary mass as on the prior exam. There are 2 stable spiculated nodular opacity seen within the left upper lobe the largest measuring 7 mm, series 6, image 65. A stable 6 mm pulmonary nodule seen in the posterior right upper lung. Extensive centrilobular emphysematous changes are seen. No midline shift is noted. Upper abdomen: Bilateral renal atrophy with scattered calcifications are seen. Musculoskeletal/Chest wall: There is no chest wall mass or suspicious osseous finding. No acute osseous abnormality small amount of subcutaneous emphysema seen along the anterior chest wall. IMPRESSION: 1. Small to moderate left-sided pneumothorax with a anterior left-sided pigtail catheter. The tip projecting at the lung base with surrounding pulmonary contusion/atelectasis. 2. Stable stable 3.5 cm pulmonary mass in the anterior left upper lobe. 3. Stable bilateral subcentimeter pulmonary nodules. 4.  Emphysema (ICD10-J43.9). 5.  Aortic Atherosclerosis (ICD10-I70.0). Electronically Signed   By: Prudencio Pair M.D.   On: 02/29/2020 19:14   MR BRAIN WO CONTRAST  Result Date: 02/29/2020 CLINICAL DATA:  CNS staging. Repeat due to excessively motion degraded postcontrast imaging to be used for treatment purposes EXAM: MRI HEAD WITHOUT CONTRAST TECHNIQUE: Multiplanar, multiecho pulse sequences of the brain and surrounding structures were obtained without intravenous contrast. COMPARISON:  02/20/2020 FINDINGS: Very motion degraded study, even more than the inadequate prior. The patient was sedated and was sleeping during much exam, which accentuated the motion. Even when aroused, motion with limiting factor. The technologist used maximal head stabilizers to no benefit. Given the degree of motion and the patient's end-stage renal disease status, additional gadolinium exposure was deemed to be of no justified benefit. Known metastatic disease with nonprogressive scattered areas of vasogenic  edema in the left frontal, left frontal parietal, right parietal, and right anterior temporal regions. No acute infarct, hydrocephalus, or acute hemorrhage. IMPRESSION: Motion degraded study, even worse than prior, such that the study was truncated before contrast administration. The patient's sedatives paradoxically diminished her ability to remain still. Electronically Signed   By: Monte Fantasia M.D.   On: 02/29/2020 09:55   MR BRAIN WO CONTRAST  Result Date: 02/16/2020 CLINICAL DATA:  Initial evaluation for possible brain mass. EXAM: MRI HEAD WITHOUT CONTRAST TECHNIQUE: Multiplanar, multiecho pulse sequences of the brain and surrounding structures were obtained without intravenous contrast. COMPARISON:  Comparison made with prior CT from earlier same day as well as recent brain MRI from 12/01/2019. FINDINGS: Brain: There has been interval development of multifocal areas of T2/FLAIR hyperintensity involving both cerebral and cerebellar hemispheres, consistent with vasogenic edema related to new metastatic disease. These foci are predominantly positioned along the gray-white matter differentiation. Largest area within the left cerebral hemisphere seen at the anterior left frontal lobe and measures  18 mm (series 11, image 17). Largest area within the right cerebral hemisphere seen at the right temporal occipital junction and measures 2.3 cm (series 11, image 12). Largest infratentorial area seen within r the right cerebellum and measures 1.7 cm. No significant regional mass effect about any of these foci at this time. There are at least 10 lesions in total. Evaluation for underlying lesion size limited given lack of IV contrast, although 1 lesion positioned at the subcortical right parietal lobe measures 5 mm, measured well on T2 weighted sequence (series 10, image 16). Few scattered foci of susceptibility artifacts seen associated with these lesions, consistent with necrosis and/or hemorrhage. Underlying  atrophy with chronic microvascular ischemic disease again noted. No evidence for acute or subacute infarct. Gray-white matter differentiation otherwise maintained. Ventricles within normal limits without hydrocephalus. No extra-axial fluid collection. Pituitary gland and suprasellar region within normal limits. Midline structures intact. Vascular: Major intracranial vascular flow voids are maintained. Skull and upper cervical spine: Craniocervical junction within normal limits. Bone marrow signal intensity normal. No focal marrow replacing lesion. No scalp soft tissue abnormality. Sinuses/Orbits: Patient status post bilateral ocular lens replacement. Paranasal sinuses are largely clear. No significant mastoid effusion. Other: None. IMPRESSION: Interval development of multifocal areas of T2/FLAIR hyperintensity involving both cerebral and cerebellar hemispheres, consistent with vasogenic edema related to new metastatic disease. No significant regional mass effect at this time. Follow-up examination with postcontrast imaging could be performed for complete assessment as clinically warranted. Please note that the risk of NSF is considered to be negligible with the use of group II agents (Gadavist). Electronically Signed   By: Jeannine Boga M.D.   On: 02/16/2020 01:05   MR BRAIN W WO CONTRAST  Result Date: 02/20/2020 CLINICAL DATA:  Metastatic disease. Staging. Patient with known diagnosis of lung cancer. EXAM: MRI HEAD WITHOUT AND WITH CONTRAST TECHNIQUE: Multiplanar, multiecho pulse sequences of the brain and surrounding structures were obtained without and with intravenous contrast. CONTRAST:  8m GADAVIST GADOBUTROL 1 MMOL/ML IV SOLN COMPARISON:  MRI 02/15/2020 FINDINGS: Brain: Multiple peripherally enhancing metastatic lesions on series 9, including: *6 mm lesion in the left frontal lobe (image 21). *6 mm lesion in the high left parietal lobe (image 118). *2 mm lesion in the high left frontal lobe (image  112). *6 mm lesion in the right parieto-occipital region (image 95). *Punctate lesion in the posterior left temporal lobe (image 84). *7 mm lesion in the posterior right temporal lobe (image 76) with additional tiny adjacent lesion (image 72). *Approximately 7 mm lesion in the anterior right temporal lobe (image 51) with limited evaluation given motion. *5 mm lesion in the right cerebellar hemisphere (image 35) *4 mm lesion in the cerebellar vermis (image 56) with likely additional punctate additional lesion (image 60). These lesions have associated mild surrounding vasogenic edema without substantial mass effect. No midline shift. Basal cisterns are patent. Some of these lesions demonstrate mild susceptibility artifact, compatible with internal hemorrhage. Otherwise, no acute hemorrhage. No acute infarct. Mild cerebral atrophy with ex vacuo ventricular dilation. No hydrocephalus. Vascular: Major arterial flow voids are maintained at the skull base. Skull and upper cervical spine: Normal marrow signal. Sinuses/Orbits: Negative. Other: No mastoid effusion. IMPRESSION: Numerous infratentorial and supratentorial enhancing metastatic lesions, as detailed above. Mild associated vasogenic edema without substantial mass effect. Electronically Signed   By: FMargaretha SheffieldMD   On: 02/20/2020 09:16   DG Chest Port 1 View  Result Date: 03/02/2020 CLINICAL DATA:  74year old female with pneumothorax. EXAM:  PORTABLE CHEST 1 VIEW COMPARISON:  Chest radiograph dated 03/03/2019. FINDINGS: There has been interval removal of the left chest tube and increase in the size of left apical pneumothorax compared to the prior radiograph. The left apical pneumothorax measures approximately 2.5 cm to the apical pleural surface. Left perihilar mass measuring approximately 2.9 x 3.2 cm as previously noted. There is a small left pleural effusion. Left lung base atelectasis and less likely infiltrate. Stable cardiac silhouette.  Atherosclerotic calcification of the aorta. No acute osseous pathology. Interval development left chest wall soft tissue emphysema. A vascular stent noted in the left axilla. IMPRESSION: 1. Interval removal of the left chest tube and increase in the size of the left apical pneumothorax compared to the prior radiograph. 2. Interval development of left chest wall soft tissue emphysema. These results were called by telephone at the time of interpretation on 03/02/2020 at 3:27 pm to provider [facility] , who verbally acknowledged these results. Electronically Signed   By: Anner Crete M.D.   On: 03/02/2020 15:36   DG Chest Port 1 View  Result Date: 03/02/2020 CLINICAL DATA:  Pneumothorax. EXAM: PORTABLE CHEST 1 VIEW COMPARISON:  03/01/2020.  CT 02/29/2020. FINDINGS: Left chest tube in stable position. Tiny left apical pneumothorax again noted, improved from prior exam. Previously noted left mid lung mass lesion is again noted. This is better demonstrated on today's exam compared to the prior study 03/01/2020. Chronic interstitial changes are again noted. Small left pleural effusion again noted. Stable cardiomegaly. Left axillary stent noted. Surgical clips in the neck noted. IMPRESSION: 1. Left chest tube in stable position. Tiny left apical pneumothorax again noted, improved from prior exam. 2. Previously noted left mid lung mass lesion again noted. This is better demonstrated on today's exam compared to prior study 03/01/2020. Chronic interstitial changes are again noted. Small left pleural effusion again noted. 3. Stable cardiomegaly. Electronically Signed   By: Marcello Moores  Register   On: 03/02/2020 07:18   DG CHEST PORT 1 VIEW  Result Date: 03/01/2020 CLINICAL DATA:  Chest tube. EXAM: PORTABLE CHEST 1 VIEW COMPARISON:  02/29/2020.  10/11/2019.  04/13/2019. FINDINGS: Left chest tube in stable position. Stable left apical pneumothorax. Cardiomegaly. No pulmonary venous congestion. Chronic interstitial disease  again noted. Small left pleural effusion again noted. Left axillary stents noted. Surgical clips in the neck. IMPRESSION: 1. Left chest tube in stable position. Stable left apical pneumothorax. Small left pleural effusion again noted. 2. Chronic interstitial lung disease again noted. Electronically Signed   By: Marcello Moores  Register   On: 03/01/2020 06:42   DG CHEST PORT 1 VIEW  Result Date: 02/29/2020 CLINICAL DATA:  Follow-up pneumothorax EXAM: PORTABLE CHEST 1 VIEW COMPARISON:  Films from earlier in the same day. FINDINGS: Chest tube is again noted on the left. Tiny left apical pneumothorax is noted but improved when compared with the prior exam. Lungs remain hyperinflated. Soft tissue mass in the midportion of the left chest is again seen. Vascular stents are again noted in the left arm. No bony abnormality is seen. IMPRESSION: Small left apical pneumothorax improved from the prior exam. Chest tube remains in place. Left mid lung mass is again seen and stable. Electronically Signed   By: Inez Catalina M.D.   On: 02/29/2020 21:37   DG Chest Portable 1 View  Result Date: 02/29/2020 CLINICAL DATA:  Left chest tube placement. EXAM: PORTABLE CHEST 1 VIEW COMPARISON:  Radiograph earlier today. FINDINGS: Placement of left-sided chest tube with tip directed towards the base.  Decreased size of left pneumothorax with moderate residual at the apex, pleural line visualized under the posterior fourth rib. Emphysema and left perihilar nodule again seen heart is normal in size. Vascular stent in the left arm. Bones are under mineralized. IMPRESSION: 1. Placement of left-sided chest tube with decreased size of left pneumothorax, moderate residual at the apex. 2. Emphysema and left perihilar nodule, unchanged. Electronically Signed   By: Keith Rake M.D.   On: 02/29/2020 18:11   DG Chest Port 1 View  Addendum Date: 02/29/2020   ADDENDUM REPORT: 02/29/2020 16:41 ADDENDUM: These results were called by telephone at the  time of interpretation on 02/29/2020 at 4:40 pm to provider [facility] , who verbally acknowledged these results. Electronically Signed   By: Fidela Salisbury MD   On: 02/29/2020 16:41   Result Date: 02/29/2020 CLINICAL DATA:  Dyspnea EXAM: PORTABLE CHEST 1 VIEW COMPARISON:  02/28/2020 FINDINGS: Large left pneumothorax is present. There is hyperexpansion of the left hemithorax in keeping with changes of at least mild tension physiology. Underlying COPD again noted with hyperexpansion of the lungs at baseline. Previously noted left mid lung zone mass is not well visualized on this examination. No pneumothorax on the right. No pleural effusion. Cardiac size is within normal limits. Pulmonary vascularity is normal. Surgical clips are seen at the neck base in keeping with changes of probable prior thyroidectomy. Vascular stent noted within the central left upper extremity no acute bone abnormality. IMPRESSION: Large left pneumothorax, new since prior examination, with some degree of tension physiology. Known left mid lung zone mass not well visualized. COPD Electronically Signed: By: Fidela Salisbury MD On: 02/29/2020 16:30   DG CHEST PORT 1 VIEW  Result Date: 02/28/2020 CLINICAL DATA:  Status post bronchoscopy EXAM: PORTABLE CHEST 1 VIEW COMPARISON:  02/28/2020 FINDINGS: Cardiac shadow is stable. Left-sided mid chest mass is again identified and stable. No post bronchoscopy pneumothorax is seen. Vascular stent on the left is noted. The lungs are otherwise clear. IMPRESSION: No evidence of post bronchoscopy pneumothorax. Stable left mid chest mass. Electronically Signed   By: Inez Catalina M.D.   On: 02/28/2020 17:40   CT Super D Chest Wo Contrast  Result Date: 02/17/2020 CLINICAL DATA:  Left upper lobe lung mass preop for EBUS. EXAM: CT CHEST WITHOUT CONTRAST TECHNIQUE: Multidetector CT imaging of the chest was performed using thin slice collimation for electromagnetic bronchoscopy planning purposes, without  intravenous contrast. COMPARISON:  Chest CT 11/18/2019 FINDINGS: Cardiovascular: The heart is normal in size. No pericardial effusion. There is mild tortuosity and moderate calcification of the thoracic aorta which is stable. Enlarged pulmonary artery suggesting pulmonary hypertension. Mediastinum/Nodes: Small scattered mediastinal and hilar lymph nodes appears stable. No obvious adenopathy on this noncontrast examination. The esophagus is grossly normal. Lungs/Pleura: Stable severe emphysematous changes and hit stable areas of pulmonary scarring. The left upper lobe lung mass measures 3.3 x 3.0 cm and previously measured 2.2 x 2.0 cm. More superiorly in the left upper lobe there is a stable 6 mm nodule. Subpleural nodular lesion in the left upper lobe on image 73/4 measures 6 mm and appears stable. Upper Abdomen: No significant upper abdominal findings. Musculoskeletal: No significant bony findings. IMPRESSION: 1. Interval enlargement of the left upper lobe lung mass, now measuring 3.3 x 3.0 cm. 2. Stable 6 mm left upper lobe pulmonary nodules. 3. Stable severe emphysematous changes and pulmonary scarring. 4. Enlarged pulmonary artery suggesting pulmonary hypertension. 5. Stable atherosclerotic calcifications involving the thoracic aorta and branch  vessels. 6. Emphysema and aortic atherosclerosis. Aortic Atherosclerosis (ICD10-I70.0) and Emphysema (ICD10-J43.9). Electronically Signed   By: Marijo Sanes M.D.   On: 02/17/2020 18:44   DG C-ARM BRONCHOSCOPY  Result Date: 02/28/2020 C-ARM BRONCHOSCOPY: Fluoroscopy was utilized by the requesting physician.  No radiographic interpretation.      Procedures:  Left chest tube/ pig tail placement.   Subjective: Patient is feeling better, improved with analgesics, no nausea or vomiting, dyspnea is improving.   Discharge Exam: Vitals:   03/03/20 0328 03/03/20 0838  BP: 121/72   Pulse: 84   Resp: 18   Temp: 98.9 F (37.2 C)   SpO2: 100% 99%   Vitals:    03/02/20 2036 03/02/20 2339 03/03/20 0328 03/03/20 0838  BP: 125/60 (!) 98/56 121/72   Pulse: 97 89 84   Resp: 14 16 18    Temp: 99 F (37.2 C) 98.9 F (37.2 C) 98.9 F (37.2 C)   TempSrc: Oral Oral Oral   SpO2: 99% 98% 100% 99%  Weight:        General: Not in pain or dyspnea  Neurology: Awake and alert, non focal  E ENT: no pallor, no icterus, oral mucosa moist Cardiovascular: No JVD. S1-S2 present, rhythmic, no gallops, rubs, or murmurs. ++ bilateral pitting lower extremity edema. Pulmonary: positive breath sounds bilaterally, with no wheezing, rhonchi or rales. Mild decreased air movement bilaterally  Gastrointestinal. Abdomen soft and non tender Skin. No rashes Musculoskeletal: no joint deformities   The results of significant diagnostics from this hospitalization (including imaging, microbiology, ancillary and laboratory) are listed below for reference.     Microbiology: Recent Results (from the past 240 hour(s))  SARS CORONAVIRUS 2 (TAT 6-24 HRS) Nasopharyngeal Nasopharyngeal Swab     Status: None   Collection Time: 02/25/20  1:44 PM   Specimen: Nasopharyngeal Swab  Result Value Ref Range Status   SARS Coronavirus 2 NEGATIVE NEGATIVE Final    Comment: (NOTE) SARS-CoV-2 target nucleic acids are NOT DETECTED.  The SARS-CoV-2 RNA is generally detectable in upper and lower respiratory specimens during the acute phase of infection. Negative results do not preclude SARS-CoV-2 infection, do not rule out co-infections with other pathogens, and should not be used as the sole basis for treatment or other patient management decisions. Negative results must be combined with clinical observations, patient history, and epidemiological information. The expected result is Negative.  Fact Sheet for Patients: SugarRoll.be  Fact Sheet for Healthcare Providers: https://www.woods-mathews.com/  This test is not yet approved or cleared by the  [place] FDA and  has been authorized for detection and/or diagnosis of SARS-CoV-2 by FDA under an Emergency Use Authorization (EUA). This EUA will remain  in effect (meaning this test can be used) for the duration of the COVID-19 declaration under Se ction 564(b)(1) of the Act, 21 U.S.C. section 360bbb-3(b)(1), unless the authorization is terminated or revoked sooner.  Performed at [facility], [place] [address] [place], [place] [address]   Respiratory Panel by RT PCR (Flu A&B, Covid) - Nasopharyngeal Swab     Status: None   Collection Time: 02/29/20  4:19 PM   Specimen: Nasopharyngeal Swab  Result Value Ref Range Status   SARS Coronavirus 2 by RT PCR NEGATIVE NEGATIVE Final    Comment: (NOTE) SARS-CoV-2 target nucleic acids are NOT DETECTED.  The SARS-CoV-2 RNA is generally detectable in upper respiratoy specimens during the acute phase of infection. The lowest concentration of SARS-CoV-2 viral copies this assay can detect is 131 copies/mL. A  negative result does not preclude SARS-Cov-2 infection and should not be used as the sole basis for treatment or other patient management decisions. A negative result may occur with  improper specimen collection/handling, submission of specimen other than nasopharyngeal swab, presence of viral mutation(s) within the areas targeted by this assay, and inadequate number of viral copies (<131 copies/mL). A negative result must be combined with clinical observations, patient history, and epidemiological information. The expected result is Negative.  Fact Sheet for Patients:  PinkCheek.be  Fact Sheet for Healthcare Providers:  GravelBags.it  This test is no t yet approved or cleared by the [place] FDA and  has been authorized for detection and/or diagnosis of SARS-CoV-2 by FDA under an Emergency Use Authorization (EUA). This EUA will remain  in effect (meaning this test  can be used) for the duration of the COVID-19 declaration under Section 564(b)(1) of the Act, 21 U.S.C. section 360bbb-3(b)(1), unless the authorization is terminated or revoked sooner.     Influenza A by PCR NEGATIVE NEGATIVE Final   Influenza B by PCR NEGATIVE NEGATIVE Final    Comment: (NOTE) The Xpert Xpress SARS-CoV-2/FLU/RSV assay is intended as an aid in  the diagnosis of influenza from Nasopharyngeal swab specimens and  should not be used as a sole basis for treatment. Nasal washings and  aspirates are unacceptable for Xpert Xpress SARS-CoV-2/FLU/RSV  testing.  Fact Sheet for Patients: PinkCheek.be  Fact Sheet for Healthcare Providers: GravelBags.it  This test is not yet approved or cleared by the [place] FDA and  has been authorized for detection and/or diagnosis of SARS-CoV-2 by  FDA under an Emergency Use Authorization (EUA). This EUA will remain  in effect (meaning this test can be used) for the duration of the  Covid-19 declaration under Section 564(b)(1) of the Act, 21  U.S.C. section 360bbb-3(b)(1), unless the authorization is  terminated or revoked. Performed at [facility], [place] [address], [place], [place] [address]      Labs: BNP (last 3 results) Recent Labs    01/30/20 1741 02/29/20 1537  BNP 294.8* 675.4*   Basic Metabolic Panel: Recent Labs  Lab 02/28/20 0939 02/28/20 1333 02/29/20 1537 02/29/20 1604 02/29/20 1609 03/01/20 0609 03/02/20 0059  NA 140   < > 140 139 139 143 138  K 6.2*   < > 5.2* 4.4 4.5 6.6* 5.3*  CL 103  --  103 110  --  102 98  CO2 18*  --  21*  --   --  23 28  GLUCOSE 91  --  195* 143*  --  105* 105*  BUN 135*  --  78* 70*  --  96* 51*  CREATININE 11.08*  --  7.09* 6.80*  --  8.13* 4.98*  CALCIUM 9.1  --  8.1*  --   --  8.5* 8.2*  PHOS  --   --   --   --   --  >30.0* 8.1*   < > = values in this interval not displayed.   Liver Function  Tests: Recent Labs  Lab 02/28/20 0939 02/29/20 1537 03/01/20 0609 03/02/20 0059  AST 17 17  --   --   ALT 17 17  --   --   ALKPHOS 51 58  --   --   BILITOT 0.5 0.7  --   --   PROT 5.8* 5.6*  --   --   ALBUMIN 3.1* 3.0* 2.8* 2.6*   No results for input(s): LIPASE, AMYLASE  in the last 168 hours. No results for input(s): AMMONIA in the last 168 hours. CBC: Recent Labs  Lab 02/28/20 0939 02/28/20 0939 02/29/20 1537 02/29/20 1604 02/29/20 1609 03/01/20 0609 03/02/20 1005  WBC 14.9*  --  13.7*  --   --  14.5* 11.9*  NEUTROABS  --   --  12.6*  --   --   --   --   HGB 7.5*   < > 6.9* 6.5* 5.8* 8.6* 7.5*  HCT 25.1*   < > 22.3* 19.0* 17.0* 27.3* 23.2*  MCV 100.4*  --  96.1  --   --  96.1 93.9  PLT 169  --  132*  --   --  110* 87*   < > = values in this interval not displayed.   Cardiac Enzymes: No results for input(s): CKTOTAL, CKMB, CKMBINDEX, TROPONINI in the last 168 hours. BNP: Invalid input(s): POCBNP CBG: No results for input(s): GLUCAP in the last 168 hours. D-Dimer No results for input(s): DDIMER in the last 72 hours. Hgb A1c No results for input(s): HGBA1C in the last 72 hours. Lipid Profile No results for input(s): CHOL, HDL, LDLCALC, TRIG, CHOLHDL, LDLDIRECT in the last 72 hours. Thyroid function studies No results for input(s): TSH, T4TOTAL, T3FREE, THYROIDAB in the last 72 hours.  Invalid input(s): FREET3 Anemia work up No results for input(s): VITAMINB12, FOLATE, FERRITIN, TIBC, IRON, RETICCTPCT in the last 72 hours. Urinalysis    Component Value Date/Time   COLORURINE YELLOW 11/18/2019 2138   APPEARANCEUR CLEAR 11/18/2019 2138   LABSPEC 1.019 11/18/2019 2138   PHURINE 6.0 11/18/2019 2138   GLUCOSEU 50 (A) 11/18/2019 2138   HGBUR MODERATE (A) 11/18/2019 2138   BILIRUBINUR NEGATIVE 11/18/2019 2138   [place] NEGATIVE 11/18/2019 2138   PROTEINUR 30 (A) 11/18/2019 2138   UROBILINOGEN 0.2 09/09/2014 0850   NITRITE NEGATIVE 11/18/2019 2138    LEUKOCYTESUR NEGATIVE 11/18/2019 2138   Sepsis Labs Invalid input(s): PROCALCITONIN,  WBC,  LACTICIDVEN Microbiology Recent Results (from the past 240 hour(s))  SARS CORONAVIRUS 2 (TAT 6-24 HRS) Nasopharyngeal Nasopharyngeal Swab     Status: None   Collection Time: 02/25/20  1:44 PM   Specimen: Nasopharyngeal Swab  Result Value Ref Range Status   SARS Coronavirus 2 NEGATIVE NEGATIVE Final    Comment: (NOTE) SARS-CoV-2 target nucleic acids are NOT DETECTED.  The SARS-CoV-2 RNA is generally detectable in upper and lower respiratory specimens during the acute phase of infection. Negative results do not preclude SARS-CoV-2 infection, do not rule out co-infections with other pathogens, and should not be used as the sole basis for treatment or other patient management decisions. Negative results must be combined with clinical observations, patient history, and epidemiological information. The expected result is Negative.  Fact Sheet for Patients: SugarRoll.be  Fact Sheet for Healthcare Providers: https://www.woods-mathews.com/  This test is not yet approved or cleared by the [place] FDA and  has been authorized for detection and/or diagnosis of SARS-CoV-2 by FDA under an Emergency Use Authorization (EUA). This EUA will remain  in effect (meaning this test can be used) for the duration of the COVID-19 declaration under Se ction 564(b)(1) of the Act, 21 U.S.C. section 360bbb-3(b)(1), unless the authorization is terminated or revoked sooner.  Performed at [facility], [place] [address], [place], [place] [address]   Respiratory Panel by RT PCR (Flu A&B, Covid) - Nasopharyngeal Swab     Status: None   Collection Time: 02/29/20  4:19 PM   Specimen: Nasopharyngeal  Swab  Result Value Ref Range Status   SARS Coronavirus 2 by RT PCR NEGATIVE NEGATIVE Final    Comment: (NOTE) SARS-CoV-2 target nucleic acids are NOT DETECTED.  The  SARS-CoV-2 RNA is generally detectable in upper respiratoy specimens during the acute phase of infection. The lowest concentration of SARS-CoV-2 viral copies this assay can detect is 131 copies/mL. A negative result does not preclude SARS-Cov-2 infection and should not be used as the sole basis for treatment or other patient management decisions. A negative result may occur with  improper specimen collection/handling, submission of specimen other than nasopharyngeal swab, presence of viral mutation(s) within the areas targeted by this assay, and inadequate number of viral copies (<131 copies/mL). A negative result must be combined with clinical observations, patient history, and epidemiological information. The expected result is Negative.  Fact Sheet for Patients:  PinkCheek.be  Fact Sheet for Healthcare Providers:  GravelBags.it  This test is no t yet approved or cleared by the [place] FDA and  has been authorized for detection and/or diagnosis of SARS-CoV-2 by FDA under an Emergency Use Authorization (EUA). This EUA will remain  in effect (meaning this test can be used) for the duration of the COVID-19 declaration under Section 564(b)(1) of the Act, 21 U.S.C. section 360bbb-3(b)(1), unless the authorization is terminated or revoked sooner.     Influenza A by PCR NEGATIVE NEGATIVE Final   Influenza B by PCR NEGATIVE NEGATIVE Final    Comment: (NOTE) The Xpert Xpress SARS-CoV-2/FLU/RSV assay is intended as an aid in  the diagnosis of influenza from Nasopharyngeal swab specimens and  should not be used as a sole basis for treatment. Nasal washings and  aspirates are unacceptable for Xpert Xpress SARS-CoV-2/FLU/RSV  testing.  Fact Sheet for Patients: PinkCheek.be  Fact Sheet for Healthcare Providers: GravelBags.it  This test is not yet approved or cleared  by the [place] FDA and  has been authorized for detection and/or diagnosis of SARS-CoV-2 by  FDA under an Emergency Use Authorization (EUA). This EUA will remain  in effect (meaning this test can be used) for the duration of the  Covid-19 declaration under Section 564(b)(1) of the Act, 21  U.S.C. section 360bbb-3(b)(1), unless the authorization is  terminated or revoked. Performed at [facility], [place] [address] [place], [place] [address]      Time coordinating discharge: 45 minutes  SIGNED:   Tawni Millers, MD  [facility] 03/03/2020, 9:32 AM

## 2020-03-03 NOTE — Progress Notes (Addendum)
Palliative Medicine Inpatient Follow Up Note   Reason for consult:GOC Discussion                 "Stg IV Lung CA"  HPI: Per intake H&P -->Natasha M Franklinis a 74 y.o.femalewith medical history significant ofstage IV lung cancer, seizures, PVD, paroxysmal A. fib, CHF, HTN, COPD, left lower extremity DVT/PEon Eliquis, ESRD on HD MWF, and anemia who presented to the ED from her dialysis center where worsening shortness of breath.She has chronic SOB that has been worse for a bout a month but became significantly worse in the last day and continuedworsen.  Palliative care was asked to get involved to continue goals of care conversations in the setting of metastatic Stg IV lung CA. Natasha [name] is known to our service as she was seen 9/24 - 9/26 at that point in time she had elected to have bronchoscopy to further identify her cancer in an attempt to better understand treatment options. She has her bronchoscopy and thereafter experienced worsening shortness of breath in the setting of a left pneumothorax. She now has a chest tube in place.  [facility] and I had completed a MOST on 9/24.   Today's Discussion (03/03/2020): Chart reviewed.  I met with Natasha [name] at bedside this morning she shares that she is having a headache. Her nurse aided in bringing her Tylenol and oxycodone.  I asked Natasha [name] how she was feeling this morning. She states that yesterday was a "rough day" on hemodialysis and she had an episode of hypotension which made her feel "funny". She shares that this morning she is feeling better. She expresses that she does not know how much more of this she is able to take. I asked her to tell me more about those feelings. She shares that her husband Natasha [name] is having a hard time contending with the situation at hand. Natasha [name] continues to asked Natasha [name] to keep going and keep trying, but Natasha [name] feels like she cannot do much more.  We again talked about the thought of  transitioning to hospice care. This would mean that her journey on earth would be short, though it would ideally be made as comfortable as possible.  Natasha [name] shares that she is not sure her husband Natasha [name] is there yet. She also expresses that it is her decision if she wants to pursue hospice. I asked her if there is a way we can help facilitate a conversation amongst both of them. She said she would be very willing to do that and it may be helpful for her. Natasha [name] has a friend Natasha [name] who works for a [employer] who she has a good relationship with and who knows her husband well. She states that she would be willing to meet with Natasha [name] care hospice - talking more about her wishes for the future.  We talked about creating short term goals of achievement.  Natasha [name] expresses that she may be going home today and she is enthusiastic about the prospects of this.  Discussed the importance of continued conversation with family and their  medical providers regarding overall plan of care and treatment options, ensuring decisions are within the context of the patients values and GOCs.  Questions and concerns addressed   Objective Assessment: Vital Signs Vitals:   03/03/20 0328 03/03/20 0838  BP: 121/72   Pulse: 84   Resp: 18   Temp: 98.9 F (37.2 C)   SpO2: 100% 99%    Intake/Output Summary (  Last 24 hours) at 03/03/2020 1143 Last data filed at 03/02/2020 1400 Gross per 24 hour  Intake 240 ml  Output 1094 ml  Net -854 ml   Last Weight  Most recent update: 03/02/2020  3:18 PM   Weight  54 kg (119 lb 0.8 oz)           Gen:  Elderly AA F in NAD HEENT: moist mucous membranes CV: Regular rate and rhythm, no murmurs rubs or gallops PULM: clear to auscultation w/ fine rhonchi ABD: soft/nontender/nondistended/normal bowel sounds  EXT: No edema  Neuro: Alert and oriented x3  SUMMARY OF RECOMMENDATIONS DNAR/DNI  Natasha [name] plan to meet with patient and  her husband this morning  Spiritual support - [facility]  Symptom Management: Metastatic Pain:                 - Continue oxycodone 20m PO Q6H                 - Continue Decadron 453mPO TID                 - Continue Lidoderm patch  Time Spent: 35 Greater than 50% of the time was spent in counseling and coordination of care ______________________________________________________________________________________ [facility] Cell Phone: [phone] utilize secure chat with additional questions, if there is no response within 30 minutes please call the above phone number  Palliative Medicine Team providers are available by phone from 7am to 7pm daily and can be reached through the team cell phone.  Should this patient require assistance outside of these hours, please call the patient's attending physician.

## 2020-03-03 NOTE — Progress Notes (Signed)
[facility] Progress Note  Subjective: seen on HD today  Vitals:   03/02/20 2036 03/02/20 2339 03/03/20 0328 03/03/20 0838  BP: 125/60 (!) 98/56 121/72   Pulse: 97 89 84   Resp: 14 16 18    Temp: 99 F (37.2 C) 98.9 F (37.2 C) 98.9 F (37.2 C)   TempSrc: Oral Oral Oral   SpO2: 99% 98% 100% 99%  Weight:        Exam: General: Frail woman, sitting up, nad, on nasal oxygen  Head: NCAT sclera not icteric Neck: Supple. No JVD appreciated  Lungs: Diminished breath sounds bilaterally; left chest tube  Heart: Tachy, no m,r,  Abdomen: soft non-tender  Lower extremities: +bilat LE 2+ edema, no open wounds  Neuro: A & O  X 3. Moves all extremities spontaneously. Dialysis Access: LUE AVF +bruit    Dialysis: [facility] MWF   4h 300/800 51kg 3K/2.25 bath  P3Hep none L AVG -Hectorol 3 mcg IV TIW -No ESAD/T Lung mass/possible malignancy.   Assessment/Plan: 1. Left pneumothorax s/p bronchoscopy -- sp chest tube per CCM, now removed. Going home today.  2. Hyperkalemia  --K+ 6.6 > 5.3, resolved.  Likely secondary to missed dialysis.  3. ESRD -  HD MWF. Had HD Thursday and Friday here. Next HD Monday at OP unit.  4. COPD/Chronic respiratory failure - on home O2.  5. Lung ca w brain mets- per pulm/oncology  6. HTN/volume  - left HD around 2kg up yest, has leg edema, she knows to limit po fluids this wknd.    7. Anemia  - Hgb 8.6 s/p 1 u prbc 10/6. Not on ESA d/t ca dx. Follow trends.  8. Metabolic bone disease - Phos >30 ?spurious result. Recheck tomorrow. Continue PhosLo binder.  9. Nutrition -Renal diet/vitamins/prot supps 10. Dispo - going home today     Kelly Splinter 03/03/2020, 12:25 PM   Recent Labs  Lab 03/01/20 0609 03/02/20 0059 03/02/20 1005  K 6.6* 5.3*  --   BUN 96* 51*  --   CREATININE 8.13* 4.98*  --   CALCIUM 8.5* 8.2*  --   PHOS >30.0* 8.1*  --   HGB 8.6*  --  7.5*   Inpatient medications:  sodium chloride   Intravenous Once    apixaban  2.5 mg Oral BID   calcitRIOL  0.25 mcg Oral Q M,W,F-HD   calcium acetate  667 mg Oral TID WC   dexamethasone  4 mg Oral TID   fluticasone furoate-vilanterol  1 puff Inhalation Daily   And   umeclidinium bromide  1 puff Inhalation Daily   levothyroxine  125 mcg Oral QAC breakfast   lidocaine  1 patch Transdermal Q24H   metoprolol tartrate  25 mg Oral BID   pantoprazole  40 mg Oral BID   sodium chloride flush  10 mL Intracatheter Q8H   sodium chloride flush  3 mL Intravenous Q12H    acetaminophen, albuterol, calcium-vitamin D, fentaNYL (SUBLIMAZE) injection, ipratropium-albuterol, nitroGLYCERIN, ondansetron, oxyCODONE

## 2020-03-03 NOTE — Progress Notes (Signed)
NAME:  Natasha [name], MRN:  [id number], DOB:  05-07-1946, LOS: 3 ADMISSION DATE:  02/29/2020, CONSULTATION DATE:  02/29/2020 REFERRING MD:  Dr. Ralene Bathe, CHIEF COMPLAINT:  Pneumothorax   Brief History   74yo female presented with progressive SOB during HD treatment. Of note patient underwent elective bronchoscopy for tissue sampling of stage IV lung cancer with Dr. Valeta Harms 10/5. On admission she is seen with large left pneumothorax requiring pigtail chest tube insertion in ED.   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 Patient reported the SOB began last night and has progressive worsened. The SOB is worse when lying flat. She denies any alleviating factors. She also denies any fever, chills, cough, chest pain, or abdominal pain.  Of note patient underwent elective bronchoscopy with Dr. Valeta Harms 10/5 for tissue sampling in the setting of stage IV lung cancer. Patient had originally opted not to treat lung mass but later decided to purse workup this year. Given significant history of COPD patient and clinical team deferred bronchoscopy in attempt to obtain tissue sampling from spinal lesion however this was non-diagnostic and this resulted in need of bronchoscopy.   On arrival patient was seen tachycardiac, tachypnic, and mildly hypertensive. Lab work significant for K 5.2, Creatinine 7.09, calcium 8.1, BNP 669.3, WBC 13.7, hgb 6.9, and hct 22.3. CXR revealed large left pneumothorax. 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 Known left mid lung zone mass not well visualized.  CXR 10/8> interval removal of L chest tube, increased size of L apical ptx. L chest wall and soft tissue emphysema.   CXR 10/9> significant interval improvement of L ptx. Possible small residual L apical ptx    Micro Data:    Antimicrobials:     Interim history/subjective:   10/8 pt accidentally removed chest tube   This morning, states she is ready to go home. Wants to use restroom    Objective   Blood pressure 121/72, pulse 84, temperature 98.9 F (37.2 C), temperature source Oral, resp. rate 18, weight 54 kg, SpO2 100 %.        Intake/Output Summary (Last 24 hours) at 03/03/2020 0759 Last data filed at 03/02/2020 1400 Gross per 24 hour  Intake 240 ml  Output 1094 ml  Net -854 ml   Filed Weights   03/01/20 1159 03/02/20 0810 03/02/20 1229  Weight: 52 kg 55.1 kg 54 kg    Examination:  General: wdwn elderly F, seated on bedside, comfortable and NAD  HEENT: NCAT pink mmm anicteric sclera  Neuro: AAOx3 following commands and conversant  CV: RRR, cap refill brisk, no rgm  PULM: Symmetrical chest expansion, Bilateral breath sounds are clear. No accessory muscle use  GI: soft round ndnt  Extremities:  L AVG. No obvious joint deformities. BLE edema  Skin: c/d/w without rash     Resolved Hospital Problem list     Assessment & Plan:  Large left iatrogenic pneumothorax from bronchoscopy 10/5 Stage IV lung cancer Chronic hypoxic respiratory failure  -Baseline requires 3Lnc Continuously  Severe COPD with underlying emphysema  -GOLDstage III-IV;PFTs in November 2020 showed FEV1 0.57 (35%), ratio 37. P: -10/9 CXR with significant  interval improvement of L ptx from prior. Residual small apical L ptx. No SOB, pain. On Baseline O2 - Stable for discharge from pulmonary perspective  -Cytology from 10/5 consistent with non-small cell lung cancer   Best practice:  Diet: Regular  Pain/Anxiety/Delirium protocol (if indicated): PRns VAP protocol (if indicated): N/A DVT prophylaxis: Resume home eliquis  GI prophylaxis: PPI Glucose control: SSI Mobility: Up with assistance  Code Status: DNR Family Communication: Pt updated 10/9  Disposition: Discharging home today  Labs   CBC: Recent Labs  Lab 02/28/20 0939 02/28/20 0939 02/29/20 1537 02/29/20 1604 02/29/20 1609 03/01/20 0609 03/02/20 1005  WBC 14.9*  --  13.7*  --   --  14.5* 11.9*  NEUTROABS  --   --  12.6*  --   --   --   --   HGB 7.5*   < > 6.9* 6.5* 5.8* 8.6* 7.5*  HCT 25.1*   < > 22.3* 19.0* 17.0* 27.3* 23.2*  MCV 100.4*  --  96.1  --   --  96.1 93.9  PLT 169  --  132*  --   --  110* 87*   < > = values in this interval not displayed.    Basic Metabolic Panel: Recent Labs  Lab 02/28/20 0939 02/28/20 1333 02/29/20 1537 02/29/20 1604 02/29/20 1609 03/01/20 0609 03/02/20 0059  NA 140   < > 140 139 139 143 138  K 6.2*   < > 5.2* 4.4 4.5 6.6* 5.3*  CL 103  --  103 110  --  102 98  CO2 18*  --  21*  --   --  23 28  GLUCOSE 91  --  195* 143*  --  105* 105*  BUN 135*  --  78* 70*  --  96* 51*  CREATININE 11.08*  --  7.09* 6.80*  --  8.13* 4.98*  CALCIUM 9.1  --  8.1*  --   --  8.5* 8.2*  PHOS  --   --   --   --   --  >30.0* 8.1*   < > = values in this interval not displayed.   GFR: Estimated Creatinine Clearance: 8.6 mL/min (A) (by C-G formula based on SCr of 4.98 mg/dL (H)). Recent Labs  Lab 02/28/20 0939 02/29/20 1537 03/01/20 0609 03/02/20 1005  WBC 14.9* 13.7* 14.5* 11.9*    Liver Function Tests: Recent Labs  Lab 02/28/20 0939 02/29/20 1537 03/01/20 0609 03/02/20 0059  AST 17 17  --   --   ALT 17 17  --   --   ALKPHOS 51 58   --   --   BILITOT 0.5 0.7  --   --   PROT 5.8* 5.6*  --   --   ALBUMIN 3.1* 3.0* 2.8* 2.6*   No results for input(s): LIPASE, AMYLASE in the last 168 hours. No results for input(s): AMMONIA in the last 168 hours.  ABG    Component Value Date/Time   PHART 7.350 04/15/2019 1436   PCO2ART 40.6 04/15/2019 1436   PO2ART 121 (H)  04/15/2019 1436   HCO3 18.5 (L) 02/29/2020 1609   TCO2 19 (L) 02/29/2020 1609   ACIDBASEDEF 4.0 (H) 02/29/2020 1609   O2SAT 100.0 02/29/2020 1609     Coagulation Profile: Recent Labs  Lab 02/28/20 0939 02/29/20 1644  INR 1.1 1.0    Eliseo Gum MSN, AGACNP-BC [place] [id number] If no answer, [phone] 03/03/2020, 7:59 AM

## 2020-03-03 NOTE — Progress Notes (Signed)
Pt discharged to home with husband.  Pt's IV removed. Pt taken off telemetry and CCMD notified.  During discharge pt stated that she was missing some jewelry in pink dentures case and that she said was taken during a procedure or in ED.  Husband said he would check back and come to the hospital on Monday if need be, they did not want to wait.  AVS documentation reviewed with Pt and all questions answered.

## 2020-03-03 NOTE — TOC Transition Note (Addendum)
Transition of Care [facility]) - CM/SW Discharge Note   Patient Details  Name: Natasha [name] MRN: [id number] Date of Birth: 1946-05-17  Transition of [facility] CM/SW Contact:  Carles Collet, RN Phone Number: 03/03/2020, 11:14 AM   Clinical Narrative:   Patient active with [facility] for [facility] PT OT, and [facility] for palliative services.  Both agencies notified of DC. [facility] liaison meeting with patient to discuss a possible transition to home hospice care.  CM will continue to follow.   Update- Per ACC, patient will go home with [facility] services and Palliative. Notified [facility], and placed resumption orders.       Final next level of care: Home w Home Health Services Barriers to Discharge: No Barriers Identified   Patient Goals and CMS Choice        Discharge Placement                       Discharge Plan and Services                          HH Arranged: PT, OT HH Agency: [facility] Date [place]: 03/03/20 Time [place]: 1114 Representative spoke with at [place]: Amy notified of DC  Social Determinants of Health ([place]) Interventions     Readmission Risk Interventions Readmission Risk Prevention Plan 04/15/2019  Transportation Screening Complete  Medication Review Press photographer) Complete  [place] Not Applicable  Some recent data might be hidden

## 2020-03-05 ENCOUNTER — Telehealth: Payer: Self-pay | Admitting: Internal Medicine

## 2020-03-05 NOTE — Telephone Encounter (Signed)
Called patient's home number to scheduled the Palliative Consult, no answer and unable to leave a message due to mailbox was full.  I then called patient's husband's cell phone, [name], and spoke with him about the Palliative referral and he said that she was already getting a therapist and I told him that was through a home health company not [place].  I also explained to him the difference between Palliative; Hospice and Home Health and he wanted to speak with the patient first and then he said he would call me back.

## 2020-03-06 ENCOUNTER — Encounter: Payer: Self-pay | Admitting: *Deleted

## 2020-03-06 ENCOUNTER — Ambulatory Visit: Payer: Medicare Other | Admitting: Radiation Oncology

## 2020-03-06 ENCOUNTER — Telehealth: Payer: Self-pay | Admitting: Internal Medicine

## 2020-03-06 ENCOUNTER — Telehealth: Payer: Self-pay | Admitting: *Deleted

## 2020-03-06 NOTE — Progress Notes (Signed)
I followed up on Natasha [name]'s schedule.  She needs a follow up with Dr. Julien Nordmann.  I notified schedule to call and schedule on 10/26 with labs.

## 2020-03-06 NOTE — Telephone Encounter (Signed)
Per Dr. Julien Nordmann, I notified pathology team to send recent bx for [facility] One and PDL 1 testing.  I was updated from pathology that there is enough tissue to send for testing.

## 2020-03-06 NOTE — Telephone Encounter (Signed)
Scheduled per 10/12 sch message spoke  With patient she understand.

## 2020-03-07 ENCOUNTER — Emergency Department (HOSPITAL_COMMUNITY): Payer: Medicare Other

## 2020-03-07 ENCOUNTER — Inpatient Hospital Stay (HOSPITAL_COMMUNITY)
Admission: EM | Admit: 2020-03-07 | Discharge: 2020-03-26 | DRG: 871 | Disposition: E | Payer: Medicare Other | Attending: Internal Medicine | Admitting: Internal Medicine

## 2020-03-07 ENCOUNTER — Other Ambulatory Visit: Payer: Self-pay

## 2020-03-07 DIAGNOSIS — Z9049 Acquired absence of other specified parts of digestive tract: Secondary | ICD-10-CM

## 2020-03-07 DIAGNOSIS — I5032 Chronic diastolic (congestive) heart failure: Secondary | ICD-10-CM | POA: Diagnosis present

## 2020-03-07 DIAGNOSIS — E872 Acidosis: Secondary | ICD-10-CM | POA: Diagnosis present

## 2020-03-07 DIAGNOSIS — G936 Cerebral edema: Secondary | ICD-10-CM | POA: Diagnosis present

## 2020-03-07 DIAGNOSIS — C7802 Secondary malignant neoplasm of left lung: Secondary | ICD-10-CM | POA: Diagnosis present

## 2020-03-07 DIAGNOSIS — A419 Sepsis, unspecified organism: Secondary | ICD-10-CM | POA: Diagnosis present

## 2020-03-07 DIAGNOSIS — D6959 Other secondary thrombocytopenia: Secondary | ICD-10-CM | POA: Diagnosis present

## 2020-03-07 DIAGNOSIS — Z9981 Dependence on supplemental oxygen: Secondary | ICD-10-CM

## 2020-03-07 DIAGNOSIS — J439 Emphysema, unspecified: Secondary | ICD-10-CM | POA: Diagnosis present

## 2020-03-07 DIAGNOSIS — I739 Peripheral vascular disease, unspecified: Secondary | ICD-10-CM | POA: Diagnosis present

## 2020-03-07 DIAGNOSIS — E890 Postprocedural hypothyroidism: Secondary | ICD-10-CM | POA: Diagnosis present

## 2020-03-07 DIAGNOSIS — K5080 Crohn's disease of both small and large intestine without complications: Secondary | ICD-10-CM | POA: Diagnosis present

## 2020-03-07 DIAGNOSIS — I480 Paroxysmal atrial fibrillation: Secondary | ICD-10-CM | POA: Diagnosis present

## 2020-03-07 DIAGNOSIS — J9611 Chronic respiratory failure with hypoxia: Secondary | ICD-10-CM | POA: Diagnosis present

## 2020-03-07 DIAGNOSIS — C7949 Secondary malignant neoplasm of other parts of nervous system: Secondary | ICD-10-CM | POA: Diagnosis present

## 2020-03-07 DIAGNOSIS — H409 Unspecified glaucoma: Secondary | ICD-10-CM | POA: Diagnosis present

## 2020-03-07 DIAGNOSIS — Z90710 Acquired absence of both cervix and uterus: Secondary | ICD-10-CM

## 2020-03-07 DIAGNOSIS — J9621 Acute and chronic respiratory failure with hypoxia: Secondary | ICD-10-CM | POA: Diagnosis present

## 2020-03-07 DIAGNOSIS — Z515 Encounter for palliative care: Secondary | ICD-10-CM

## 2020-03-07 DIAGNOSIS — D631 Anemia in chronic kidney disease: Secondary | ICD-10-CM | POA: Diagnosis present

## 2020-03-07 DIAGNOSIS — Z79899 Other long term (current) drug therapy: Secondary | ICD-10-CM

## 2020-03-07 DIAGNOSIS — Z66 Do not resuscitate: Secondary | ICD-10-CM | POA: Diagnosis present

## 2020-03-07 DIAGNOSIS — Z7901 Long term (current) use of anticoagulants: Secondary | ICD-10-CM

## 2020-03-07 DIAGNOSIS — Z7189 Other specified counseling: Secondary | ICD-10-CM | POA: Diagnosis not present

## 2020-03-07 DIAGNOSIS — Z87442 Personal history of urinary calculi: Secondary | ICD-10-CM

## 2020-03-07 DIAGNOSIS — E039 Hypothyroidism, unspecified: Secondary | ICD-10-CM | POA: Diagnosis present

## 2020-03-07 DIAGNOSIS — Z7951 Long term (current) use of inhaled steroids: Secondary | ICD-10-CM

## 2020-03-07 DIAGNOSIS — M810 Age-related osteoporosis without current pathological fracture: Secondary | ICD-10-CM | POA: Diagnosis present

## 2020-03-07 DIAGNOSIS — K219 Gastro-esophageal reflux disease without esophagitis: Secondary | ICD-10-CM | POA: Diagnosis present

## 2020-03-07 DIAGNOSIS — Z87891 Personal history of nicotine dependence: Secondary | ICD-10-CM

## 2020-03-07 DIAGNOSIS — J189 Pneumonia, unspecified organism: Secondary | ICD-10-CM | POA: Diagnosis present

## 2020-03-07 DIAGNOSIS — R778 Other specified abnormalities of plasma proteins: Secondary | ICD-10-CM | POA: Diagnosis not present

## 2020-03-07 DIAGNOSIS — Z79890 Hormone replacement therapy (postmenopausal): Secondary | ICD-10-CM

## 2020-03-07 DIAGNOSIS — Z888 Allergy status to other drugs, medicaments and biological substances status: Secondary | ICD-10-CM

## 2020-03-07 DIAGNOSIS — N186 End stage renal disease: Secondary | ICD-10-CM | POA: Diagnosis present

## 2020-03-07 DIAGNOSIS — I132 Hypertensive heart and chronic kidney disease with heart failure and with stage 5 chronic kidney disease, or end stage renal disease: Secondary | ICD-10-CM | POA: Diagnosis present

## 2020-03-07 DIAGNOSIS — Z8249 Family history of ischemic heart disease and other diseases of the circulatory system: Secondary | ICD-10-CM

## 2020-03-07 DIAGNOSIS — F419 Anxiety disorder, unspecified: Secondary | ICD-10-CM | POA: Diagnosis present

## 2020-03-07 DIAGNOSIS — R0600 Dyspnea, unspecified: Secondary | ICD-10-CM

## 2020-03-07 DIAGNOSIS — C7931 Secondary malignant neoplasm of brain: Secondary | ICD-10-CM | POA: Diagnosis present

## 2020-03-07 DIAGNOSIS — Z8349 Family history of other endocrine, nutritional and metabolic diseases: Secondary | ICD-10-CM

## 2020-03-07 DIAGNOSIS — C3492 Malignant neoplasm of unspecified part of left bronchus or lung: Secondary | ICD-10-CM | POA: Diagnosis present

## 2020-03-07 DIAGNOSIS — Z86718 Personal history of other venous thrombosis and embolism: Secondary | ICD-10-CM

## 2020-03-07 DIAGNOSIS — C7951 Secondary malignant neoplasm of bone: Secondary | ICD-10-CM | POA: Diagnosis present

## 2020-03-07 DIAGNOSIS — Z20822 Contact with and (suspected) exposure to covid-19: Secondary | ICD-10-CM | POA: Diagnosis present

## 2020-03-07 DIAGNOSIS — M1990 Unspecified osteoarthritis, unspecified site: Secondary | ICD-10-CM | POA: Diagnosis present

## 2020-03-07 DIAGNOSIS — Z992 Dependence on renal dialysis: Secondary | ICD-10-CM

## 2020-03-07 DIAGNOSIS — N2581 Secondary hyperparathyroidism of renal origin: Secondary | ICD-10-CM | POA: Diagnosis present

## 2020-03-07 DIAGNOSIS — R0989 Other specified symptoms and signs involving the circulatory and respiratory systems: Secondary | ICD-10-CM | POA: Diagnosis present

## 2020-03-07 DIAGNOSIS — I351 Nonrheumatic aortic (valve) insufficiency: Secondary | ICD-10-CM | POA: Diagnosis present

## 2020-03-07 DIAGNOSIS — I248 Other forms of acute ischemic heart disease: Secondary | ICD-10-CM | POA: Diagnosis present

## 2020-03-07 DIAGNOSIS — Z7952 Long term (current) use of systemic steroids: Secondary | ICD-10-CM

## 2020-03-07 DIAGNOSIS — Z86711 Personal history of pulmonary embolism: Secondary | ICD-10-CM

## 2020-03-07 DIAGNOSIS — D696 Thrombocytopenia, unspecified: Secondary | ICD-10-CM | POA: Diagnosis present

## 2020-03-07 DIAGNOSIS — R0609 Other forms of dyspnea: Secondary | ICD-10-CM | POA: Diagnosis not present

## 2020-03-07 DIAGNOSIS — G40909 Epilepsy, unspecified, not intractable, without status epilepticus: Secondary | ICD-10-CM | POA: Diagnosis present

## 2020-03-07 LAB — CBC WITH DIFFERENTIAL/PLATELET
Abs Immature Granulocytes: 0.84 10*3/uL — ABNORMAL HIGH (ref 0.00–0.07)
Basophils Absolute: 0 10*3/uL (ref 0.0–0.1)
Basophils Relative: 0 %
Eosinophils Absolute: 0 10*3/uL (ref 0.0–0.5)
Eosinophils Relative: 0 %
HCT: 28.3 % — ABNORMAL LOW (ref 36.0–46.0)
Hemoglobin: 8.5 g/dL — ABNORMAL LOW (ref 12.0–15.0)
Immature Granulocytes: 7 %
Lymphocytes Relative: 4 %
Lymphs Abs: 0.5 10*3/uL — ABNORMAL LOW (ref 0.7–4.0)
MCH: 30 pg (ref 26.0–34.0)
MCHC: 30 g/dL (ref 30.0–36.0)
MCV: 100 fL (ref 80.0–100.0)
Monocytes Absolute: 0.4 10*3/uL (ref 0.1–1.0)
Monocytes Relative: 3 %
Neutro Abs: 9.8 10*3/uL — ABNORMAL HIGH (ref 1.7–7.7)
Neutrophils Relative %: 86 %
Platelets: 90 10*3/uL — ABNORMAL LOW (ref 150–400)
RBC: 2.83 MIL/uL — ABNORMAL LOW (ref 3.87–5.11)
RDW: 17.1 % — ABNORMAL HIGH (ref 11.5–15.5)
WBC Morphology: INCREASED
WBC: 11.5 10*3/uL — ABNORMAL HIGH (ref 4.0–10.5)
nRBC: 0.3 % — ABNORMAL HIGH (ref 0.0–0.2)

## 2020-03-07 LAB — I-STAT VENOUS BLOOD GAS, ED
Acid-base deficit: 4 mmol/L — ABNORMAL HIGH (ref 0.0–2.0)
Bicarbonate: 20.9 mmol/L (ref 20.0–28.0)
Calcium, Ion: 0.97 mmol/L — ABNORMAL LOW (ref 1.15–1.40)
HCT: 21 % — ABNORMAL LOW (ref 36.0–46.0)
Hemoglobin: 7.1 g/dL — ABNORMAL LOW (ref 12.0–15.0)
O2 Saturation: 79 %
Potassium: 4 mmol/L (ref 3.5–5.1)
Sodium: 138 mmol/L (ref 135–145)
TCO2: 22 mmol/L (ref 22–32)
pCO2, Ven: 38.8 mmHg — ABNORMAL LOW (ref 44.0–60.0)
pH, Ven: 7.34 (ref 7.250–7.430)
pO2, Ven: 46 mmHg — ABNORMAL HIGH (ref 32.0–45.0)

## 2020-03-07 LAB — MAGNESIUM: Magnesium: 1.5 mg/dL — ABNORMAL LOW (ref 1.7–2.4)

## 2020-03-07 LAB — BASIC METABOLIC PANEL
Anion gap: 18 — ABNORMAL HIGH (ref 5–15)
BUN: 71 mg/dL — ABNORMAL HIGH (ref 8–23)
CO2: 22 mmol/L (ref 22–32)
Calcium: 8.2 mg/dL — ABNORMAL LOW (ref 8.9–10.3)
Chloride: 100 mmol/L (ref 98–111)
Creatinine, Ser: 7.03 mg/dL — ABNORMAL HIGH (ref 0.44–1.00)
GFR, Estimated: 5 mL/min — ABNORMAL LOW (ref >60)
Glucose, Bld: 89 mg/dL (ref 70–99)
Potassium: 4.1 mmol/L (ref 3.5–5.1)
Sodium: 140 mmol/L (ref 135–145)

## 2020-03-07 LAB — TROPONIN I (HIGH SENSITIVITY)
Troponin I (High Sensitivity): 83 ng/L — ABNORMAL HIGH (ref <18)
Troponin I (High Sensitivity): 106 ng/L (ref <18)
Troponin I (High Sensitivity): 153 ng/L (ref <18)

## 2020-03-07 LAB — PHOSPHORUS: Phosphorus: 5.3 mg/dL — ABNORMAL HIGH (ref 2.5–4.6)

## 2020-03-07 LAB — RESPIRATORY PANEL BY RT PCR (FLU A&B, COVID)
Influenza A by PCR: NEGATIVE
Influenza B by PCR: NEGATIVE
SARS Coronavirus 2 by RT PCR: NEGATIVE

## 2020-03-07 LAB — HIV ANTIBODY (ROUTINE TESTING W REFLEX): HIV Screen 4th Generation wRfx: NONREACTIVE

## 2020-03-07 LAB — BRAIN NATRIURETIC PEPTIDE: B Natriuretic Peptide: 446.2 pg/mL — ABNORMAL HIGH (ref 0.0–100.0)

## 2020-03-07 MED ORDER — SODIUM CHLORIDE 0.9 % IV BOLUS
500.0000 mL | Freq: Once | INTRAVENOUS | Status: AC
  Administered 2020-03-07: 500 mL via INTRAVENOUS

## 2020-03-07 MED ORDER — VANCOMYCIN HCL IN DEXTROSE 1-5 GM/200ML-% IV SOLN: 1000.0000 mg | Freq: Once | INTRAVENOUS | Status: DC

## 2020-03-07 MED ORDER — LORAZEPAM 1 MG PO TABS: 1.0000 mg | ORAL_TABLET | ORAL | Status: DC | PRN

## 2020-03-07 MED ORDER — HYDROMORPHONE HCL 1 MG/ML IJ SOLN
0.5000 mg | INTRAMUSCULAR | Status: DC | PRN
  Administered 2020-03-07 – 2020-03-09 (×5): 0.5 mg via INTRAVENOUS
  Filled 2020-03-07: qty 1
  Filled 2020-03-07 (×4): qty 0.5

## 2020-03-07 MED ORDER — ACETAMINOPHEN 650 MG RE SUPP
650.0000 mg | RECTAL | Status: DC | PRN
  Administered 2020-03-07: 650 mg via RECTAL
  Filled 2020-03-07: qty 1

## 2020-03-07 MED ORDER — SODIUM CHLORIDE 0.9 % IV SOLN
1.0000 g | INTRAVENOUS | Status: DC
  Administered 2020-03-08 – 2020-03-11 (×4): 1 g via INTRAVENOUS
  Filled 2020-03-07 (×5): qty 1

## 2020-03-07 MED ORDER — FLUTICASONE-UMECLIDIN-VILANT 100-62.5-25 MCG/INH IN AEPB: 1.0000 | INHALATION_SPRAY | Freq: Every day | RESPIRATORY_TRACT | Status: DC

## 2020-03-07 MED ORDER — HYDROMORPHONE HCL 1 MG/ML IJ SOLN
1.0000 mg | Freq: Once | INTRAMUSCULAR | Status: AC
  Administered 2020-03-07: 1 mg via INTRAVENOUS
  Filled 2020-03-07: qty 1

## 2020-03-07 MED ORDER — IOHEXOL 350 MG/ML SOLN
100.0000 mL | Freq: Once | INTRAVENOUS | Status: AC | PRN
  Administered 2020-03-07: 70 mL via INTRAVENOUS

## 2020-03-07 MED ORDER — LORAZEPAM 2 MG/ML IJ SOLN
0.5000 mg | Freq: Once | INTRAMUSCULAR | Status: AC
  Administered 2020-03-07: 0.5 mg via INTRAVENOUS
  Filled 2020-03-07: qty 1

## 2020-03-07 MED ORDER — LEVOTHYROXINE SODIUM 25 MCG PO TABS
125.0000 ug | ORAL_TABLET | Freq: Every day | ORAL | Status: DC
  Administered 2020-03-08 – 2020-03-12 (×5): 125 ug via ORAL
  Filled 2020-03-07 (×5): qty 1

## 2020-03-07 MED ORDER — VANCOMYCIN VARIABLE DOSE PER UNSTABLE RENAL FUNCTION (PHARMACIST DOSING): Status: DC

## 2020-03-07 MED ORDER — MIDODRINE HCL 5 MG PO TABS
10.0000 mg | ORAL_TABLET | Freq: Three times a day (TID) | ORAL | Status: DC
  Administered 2020-03-07 – 2020-03-12 (×13): 10 mg via ORAL
  Filled 2020-03-07 (×16): qty 2

## 2020-03-07 MED ORDER — IPRATROPIUM-ALBUTEROL 0.5-2.5 (3) MG/3ML IN SOLN
3.0000 mL | Freq: Four times a day (QID) | RESPIRATORY_TRACT | Status: DC | PRN
  Administered 2020-03-10: 3 mL via RESPIRATORY_TRACT
  Filled 2020-03-07: qty 3

## 2020-03-07 MED ORDER — DEXAMETHASONE 4 MG PO TABS
4.0000 mg | ORAL_TABLET | Freq: Three times a day (TID) | ORAL | Status: DC
  Administered 2020-03-07 – 2020-03-12 (×14): 4 mg via ORAL
  Filled 2020-03-07 (×17): qty 1

## 2020-03-07 MED ORDER — CALCITRIOL 0.25 MCG PO CAPS
0.2500 ug | ORAL_CAPSULE | ORAL | Status: DC
  Administered 2020-03-09: 0.25 ug via ORAL
  Filled 2020-03-07: qty 1

## 2020-03-07 MED ORDER — NITROGLYCERIN 0.4 MG SL SUBL: 0.4000 mg | SUBLINGUAL_TABLET | SUBLINGUAL | Status: DC | PRN

## 2020-03-07 MED ORDER — DILTIAZEM LOAD VIA INFUSION
15.0000 mg | Freq: Once | INTRAVENOUS | Status: DC
  Filled 2020-03-07: qty 15

## 2020-03-07 MED ORDER — APIXABAN 2.5 MG PO TABS
2.5000 mg | ORAL_TABLET | Freq: Two times a day (BID) | ORAL | Status: DC
  Administered 2020-03-08 – 2020-03-12 (×10): 2.5 mg via ORAL
  Filled 2020-03-07 (×12): qty 1

## 2020-03-07 MED ORDER — UMECLIDINIUM BROMIDE 62.5 MCG/INH IN AEPB
1.0000 | INHALATION_SPRAY | Freq: Every day | RESPIRATORY_TRACT | Status: DC
  Administered 2020-03-09 – 2020-03-12 (×4): 1 via RESPIRATORY_TRACT
  Filled 2020-03-07 (×2): qty 7

## 2020-03-07 MED ORDER — FLUTICASONE FUROATE-VILANTEROL 100-25 MCG/INH IN AEPB
1.0000 | INHALATION_SPRAY | Freq: Every day | RESPIRATORY_TRACT | Status: DC
  Administered 2020-03-09 – 2020-03-12 (×4): 1 via RESPIRATORY_TRACT
  Filled 2020-03-07 (×2): qty 28

## 2020-03-07 MED ORDER — ALBUTEROL SULFATE HFA 108 (90 BASE) MCG/ACT IN AERS
2.0000 | INHALATION_SPRAY | Freq: Four times a day (QID) | RESPIRATORY_TRACT | Status: DC | PRN
  Filled 2020-03-07: qty 6.7

## 2020-03-07 MED ORDER — PANTOPRAZOLE SODIUM 40 MG PO TBEC
40.0000 mg | DELAYED_RELEASE_TABLET | Freq: Two times a day (BID) | ORAL | Status: DC
  Administered 2020-03-08 – 2020-03-12 (×10): 40 mg via ORAL
  Filled 2020-03-07 (×10): qty 1

## 2020-03-07 MED ORDER — DILTIAZEM HCL-DEXTROSE 125-5 MG/125ML-% IV SOLN (PREMIX)
5.0000 mg/h | INTRAVENOUS | Status: DC
  Administered 2020-03-07: 5 mg/h via INTRAVENOUS
  Filled 2020-03-07: qty 125

## 2020-03-07 MED ORDER — VANCOMYCIN HCL 1250 MG/250ML IV SOLN
1250.0000 mg | Freq: Once | INTRAVENOUS | Status: AC
  Administered 2020-03-07: 1250 mg via INTRAVENOUS
  Filled 2020-03-07: qty 250

## 2020-03-07 MED ORDER — METHOCARBAMOL 500 MG PO TABS: 1500.0000 mg | ORAL_TABLET | Freq: Three times a day (TID) | ORAL | Status: DC | PRN

## 2020-03-07 MED ORDER — SODIUM CHLORIDE 0.9 % IV SOLN
2.0000 g | Freq: Once | INTRAVENOUS | Status: AC
  Administered 2020-03-07: 2 g via INTRAVENOUS
  Filled 2020-03-07: qty 2

## 2020-03-07 NOTE — ED Notes (Signed)
Please call Nicole Kindred (husband) [phone] when patient arrives to inpatient room.

## 2020-03-07 NOTE — ED Triage Notes (Signed)
Patient from home with [facility] with complaint of shortness of breath. Patient reports collapsed left lung last week chest tube was removed Saturday when she stepped on it and ripped it out. patient states her shortness of breath woke her up this morning. States symptoms are similar to last week but not as severe. Patient arrives on NRB. Patient is on dialysis schedule MWF, last full treatment this passed Monday. Patient alert and oriented at this time.

## 2020-03-07 NOTE — ED Notes (Signed)
Pt's husband at bedside notified Dr. Doristine Bosworth

## 2020-03-07 NOTE — ED Notes (Signed)
Admitting Dr. Doristine Bosworth informed orders for blood cultures placed after initiation of IV antibiotics. MD request rn to continue with collection of blood cultures.

## 2020-03-07 NOTE — ED Notes (Signed)
Phlebotomy requested for lab draw

## 2020-03-07 NOTE — ED Notes (Signed)
Attempted to call report to [id number]. RN in a patients room will call back 5-10 minutes

## 2020-03-07 NOTE — ED Provider Notes (Signed)
[facility] EMERGENCY DEPARTMENT Provider Note   CSN: [id number] Arrival date & time: 03/24/2020  0900     History Chief Complaint  Patient presents with  . Shortness of Breath    Natasha [name] is a 74 y.o. female with a past medical history of ESRD on dialysis MWF, stage IV lung cancer, seizures, CHF, hypertension, paroxysmal A. fib, prior PE/DVT currently on Eliquis presenting to the ED with a chief complaint of shortness of breath.  Patient was discharged from hospital admission on 03/03/2020 for left-sided pneumothorax after bronchoscopy for tissue sampling.  She had a chest tube placed with improvement.  She wears 3 L of oxygen via nasal cannula at baseline since discharge.  Was in her usual state of health when she was discharged from the hospital 4 days ago.  Last night she started developing worsening shortness of breath, chest pain and palpitations.  She also reports bilateral lower extremity edema which is not typical for her.  She went to her regularly scheduled dialysis session 2 days ago.  She has been taking her medications as prescribed.  No fevers.  Reports a slight dry cough.  Denies any abdominal pain, vomiting, sick contacts with similar symptoms, urinary symptoms or changes to bowel movements.  HPI     Past Medical History:  Diagnosis Date  . Anal fistula   . Anemia in chronic kidney disease (CKD)   . Anxiety   . Aortic insufficiency    Echo 3/18: mod conc LVH, EF 60-65, no RWMA, Gr 1 DD, mod AI, mild MR, normal RVSF, Trivial TR  . Arthritis   . Borderline hypertension   . Bulging disc    L3-L4  . Cancer ([place])    Brain and spinal cord mets.  . CHF (congestive heart failure) ([place])   . Chronic diarrhea    due to crohn's  . CKD (chronic kidney disease), stage IV (HCC)    MWF- [facility]  . Crohn's disease ([place])    chronic ileitis  . Dyspnea    with exertion  . Emphysema/COPD ([place])   . GERD (gastroesophageal reflux disease)     denies  . History of blood transfusion   . History of glaucoma   . History of kidney stones   . History of pancreatitis    2008--  mercaptopurine  . History of small bowel obstruction    12-03-2010  due to crohn's ileitis  . Hypertension    no longer on medications  . Hypothyroidism, postsurgical    multinodule w/ hurthle cells  . Osteoporosis   . PAF (paroxysmal atrial fibrillation) ([place])   . Perianal Crohn's disease ([place])   . Peripheral vascular disease ([place])    blood clot behind knee left leg  . Polyarthralgia   . Pulmonary embolism ([place])   . Seizures ([place])    03/2017  . Wears partial dentures    upper    Patient Active Problem List   Diagnosis Date Noted  . PAF (paroxysmal atrial fibrillation) ([place])   . Acute respiratory failure ([place]) 02/29/2020  . Pneumothorax   . Lung mass 02/18/2020  . Palliative care by specialist   . DNR (do not resuscitate)   . Metastasis to brain ([place]) 02/16/2020  . Brain lesion   . GERD (gastroesophageal reflux disease)   . Headache   . Acute GI bleeding 01/30/2020  . Acute blood loss anemia 01/30/2020  . Supraventricular tachycardia ([place]) 12/27/2019  . Arrhythmia 11/19/2019  . Elevated troponin 11/19/2019  .  Epigastric abdominal pain 11/18/2019  . Malignant neoplasm of overlapping sites of left lung ([place]) 11/17/2019  . Metastatic lung carcinoma, left ([place]) 11/15/2019  . Goals of care, counseling/discussion 11/15/2019  . Multifocal pneumonia 09/27/2019  . History of COVID-19 04/13/2019  . Acute on chronic respiratory failure with hypoxia ([place]) 04/13/2019  . Chronic anticoagulation 04/13/2019  . Medication management 02/22/2019  . Chronic respiratory failure with hypoxia ([place]) 09/23/2018  . Hx of Pulmonary embolism ([place]) 05/28/2018  . COPD with acute exacerbation ([place]) 03/31/2018  . Acute sinusitis 03/31/2018  . Crohn's disease of colon with complication ([place])   . Aortic insufficiency 07/30/2016  . Atherosclerosis of aorta ([place])  07/30/2016  . Pressure injury of skin 07/14/2016  . Gastroenteritis, acute 07/12/2016  . COPD (chronic obstructive pulmonary disease) ([place]) 10/12/2015  . Malnutrition of moderate degree ([place]) 09/13/2014  . history of Left leg DVT 09/10/2014  . Hypotension 09/10/2014  . Hypoglycemia 09/10/2014  . Ischiorectal abscess 09/09/2014  . Increased anion gap metabolic acidosis   . Generalized abdominal pain   . Chronic diastolic CHF (congestive heart failure) ([place])   . Hypertensive heart disease   . Secondary hyperparathyroidism ([place])   . Other specified hypothyroidism   . Right shoulder pain   . Thrombocytopenia ([place])   . Severe protein-calorie malnutrition ([place])   . Thyroid activity decreased   . Emphysema of lung ([place])   . Nephrolithiasis 03/29/2014  . Spinal stenosis of lumbar region 11/01/2013  . Bilateral leg pain 09/28/2013  . Emphysema/COPD ([place])   . Shortness of breath 09/20/2013  . Protein-calorie malnutrition, severe ([place]) 05/14/2013  . Salmonella enteritidis 06/11/2012  . Hypocalcemia 06/10/2012  . Hypothyroidism 06/10/2012  . Absolute anemia 06/09/2012  . Hypomagnesemia 06/09/2012  . ESRD on dialysis ([place]) 06/09/2012  . Dehydration 06/07/2012  . Crohn's disease without complication ([place]) 40/98/1191  . Metabolic acidosis 47/82/9562  . Hypokalemia 06/07/2012  . Nausea vomiting and diarrhea 06/07/2012  . UTI (lower urinary tract infection) 06/07/2012    Past Surgical History:  Procedure Laterality Date  . ABDOMINAL HYSTERECTOMY  1990   and  Appendectomy  . AV FISTULA PLACEMENT Left 12/08/2017   Procedure: INSERTION OF ARTERIOVENOUS (AV) GRAFT WITH ARTEGRAFT TO LEFT UPPER ARM;  Surgeon: Conrad Crossville, MD;  Location: [facility];  Service: Vascular;  Laterality: Left;  . BASCILIC VEIN TRANSPOSITION Left 01/09/2017   Procedure: LEFT 1ST STAGE BRACHIAL VEIN TRANSPOSITION;  Surgeon: Conrad Lewisville, MD;  Location: [facility];  Service: Vascular;  Laterality: Left;  . BASCILIC VEIN  TRANSPOSITION Left 06/16/2017   Procedure: Second Stage BASILIC VEIN TRANSPOSITION  LEFT ARM;  Surgeon: Conrad Charlestown, MD;  Location: [facility];  Service: Vascular;  Laterality: Left;  . BIOPSY  03/09/2018   Procedure: BIOPSY;  Surgeon: Wilford Corner, MD;  Location: [facility];  Service: Endoscopy;;  . BIOPSY  01/31/2020   Procedure: BIOPSY;  Surgeon: Otis Brace, MD;  Location: [facility];  Service: Gastroenterology;;  . BIOPSY  02/02/2020   Procedure: BIOPSY;  Surgeon: Otis Brace, MD;  Location: [facility];  Service: Gastroenterology;;  . BRONCHIAL BIOPSY  02/28/2020   Procedure: BRONCHIAL BIOPSIES;  Surgeon: Garner Nash, DO;  Location: [facility];  Service: Pulmonary;;  . BRONCHIAL BRUSHINGS  02/28/2020   Procedure: BRONCHIAL BRUSHINGS;  Surgeon: Garner Nash, DO;  Location: [facility];  Service: Pulmonary;;  . BRONCHIAL NEEDLE ASPIRATION BIOPSY  02/28/2020   Procedure: BRONCHIAL NEEDLE ASPIRATION BIOPSIES;  Surgeon: Garner Nash, DO;  Location: [facility];  Service: Pulmonary;;  . BRONCHIAL WASHINGS  02/28/2020   Procedure: BRONCHIAL WASHINGS;  Surgeon: Garner Nash, DO;  Location: [facility];  Service: Pulmonary;;  . CHOLECYSTECTOMY    . COLON RESECTION  x3 --  1978,  1987, 1989   ILEAL RESECTION x2/   [place]  . COLONOSCOPY WITH PROPOFOL N/A 09/25/2014   Procedure: COLONOSCOPY WITH PROPOFOL;  Surgeon: Garlan Fair, MD;  [facility];  Service: Endoscopy;  Laterality: N/A;  . COLONOSCOPY WITH PROPOFOL N/A 03/09/2018   Procedure: COLONOSCOPY WITH PROPOFOL;  Surgeon: Wilford Corner, MD;  [facility];  Service: Endoscopy;  Laterality: N/A;  . COLONOSCOPY WITH PROPOFOL N/A 02/02/2020   Procedure: COLONOSCOPY WITH PROPOFOL;  Surgeon: Otis Brace, MD;  Location: [facility];  Service: Gastroenterology;  Laterality: N/A;  . CYSTOSCOPY W/ URETERAL STENT PLACEMENT Right 11/19/2016   Procedure: CYSTOSCOPY WITH RIGHT  RETROGRADE PYELOGRAM/ URETEROSCOPY WITH LASER AND RIGHT URETERAL STENT PLACEMENT;  Surgeon: Ardis Hughs, MD;  [facility];  Service: Urology;  Laterality: Right;  . ESOPHAGOGASTRODUODENOSCOPY  03/04/2012   Procedure: ESOPHAGOGASTRODUODENOSCOPY (EGD);  Surgeon: Garlan Fair, MD;  Location: [facility];  Service: Endoscopy;  Laterality: N/A;  . ESOPHAGOGASTRODUODENOSCOPY (EGD) WITH PROPOFOL N/A 01/31/2020   Procedure: ESOPHAGOGASTRODUODENOSCOPY (EGD) WITH PROPOFOL;  Surgeon: Otis Brace, MD;  [facility];  Service: Gastroenterology;  Laterality: N/A;  . EXAMINATION UNDER ANESTHESIA N/A 09/12/2014   Procedure: EXAM UNDER ANESTHESIA;  Surgeon: Rolm Bookbinder, MD;  Location: [facility];  Service: General;  Laterality: N/A;  . EYE SURGERY     laser  . FLEXIBLE SIGMOIDOSCOPY  03/04/2012   Procedure: FLEXIBLE SIGMOIDOSCOPY;  Surgeon: Garlan Fair, MD;  [facility];  Service: Endoscopy;  Laterality: N/A;  . GLAUCOMA SURGERY Bilateral   . HOLMIUM LASER APPLICATION Right 8/46/9629   Procedure: HOLMIUM LASER APPLICATION;  Surgeon: Ardis Hughs, MD;  [facility];  Service: Urology;  Laterality: Right;  . INCISION AND DRAINAGE PERIRECTAL ABSCESS N/A 09/12/2014   Procedure: IRRIGATION AND DEBRIDEMENT PERIRECTAL ABSCESS;  Surgeon: Rolm Bookbinder, MD;  Location: [facility];  Service: General;  Laterality: N/A;  . IR FLUORO GUIDE CV LINE RIGHT  03/20/2017  . IR US GUIDE VASC ACCESS RIGHT  03/20/2017  . NEGATIVE SLEEP STUDY  2008  . PLACEMENT OF SETON N/A 12/01/2014   Procedure: PLACEMENT OF SETON;  Surgeon: [name], MD;  Location[facility];  Service: General;  Laterality: N/A;  . POLYPECTOMY  03/09/2018   Procedure: POLYPECTOMY;  Surgeon: Wilford Corner, MD;  [facility];  Service: Endoscopy;;  . POLYPECTOMY  02/02/2020   Procedure: POLYPECTOMY;  Surgeon: Otis Brace, MD;  Location: [facility];  Service:  Gastroenterology;;  . RECTAL EXAM UNDER ANESTHESIA N/A 12/01/2014   Procedure: RECTAL EXAM UNDER ANESTHESIA;  Surgeon: [name], MD;  Location: [facility];  Service: General;  Laterality: N/A;  . TOTAL THYROIDECTOMY  10-13-2003  . TRANSTHORACIC ECHOCARDIOGRAM  07-11-2013   mild LVH,  ef 55%,  moderate AR,  mild MR and TR,  trivial PR  . TUBAL LIGATION    . VIDEO BRONCHOSCOPY WITH ENDOBRONCHIAL NAVIGATION N/A 02/28/2020   Procedure: VIDEO BRONCHOSCOPY WITH ENDOBRONCHIAL NAVIGATION;  Surgeon: Garner Nash, DO;  Location: [facility];  Service: Pulmonary;  Laterality: N/A;     OB History   No obstetric history on file.     Family History  Problem Relation Age of Onset  . Hypertension Father   .  Heart disease Father   . Heart attack Father   . Diverticulitis Mother   . COPD Mother   . Hypertension Mother   . Heart disease Mother   . Heart attack Mother   . Heart attack Brother   . Colon cancer Brother   . Diabetes Brother   . Thyroid disease Brother     Social History   Tobacco Use  . Smoking status: Former Smoker    Packs/day: 1.00    Years: 35.00    Pack years: 35.00    Types: Cigarettes    Start date: 02/22/1967    Quit date: 11/27/2017    Years since quitting: 2.2  . Smokeless tobacco: Never Used  Vaping Use  . Vaping Use: Never used  Substance Use Topics  . Alcohol use: No  . Drug use: No    Home Medications Prior to Admission medications   Medication Sig Start Date End Date Taking? Authorizing Provider  acetaminophen (TYLENOL) 500 MG tablet Take 1,000 mg by mouth every 6 (six) hours as needed for mild pain.    Yes [provider]  Adalimumab (HUMIRA) 40 MG/0.8ML PSKT Inject 40 mg into the skin every 14 (fourteen) days.    Yes [provider]  albuterol (VENTOLIN HFA) 108 (90 Base) MCG/ACT inhaler INHALE 2 PUFFS BY MOUTH EVERY 6 HOURS IF NEEDED FOR WHEEZING OR SHORTNESS OF BREATH Patient taking differently: Inhale 2 puffs  into the lungs every 6 (six) hours as needed for wheezing or shortness of breath. INHALE 2 PUFFS BY MOUTH EVERY 6 HOURS IF NEEDED FOR WHEEZING OR SHORTNESS OF BREATH 05/31/19  Yes Chesley Mires, MD  calcitRIOL (ROCALTROL) 0.25 MCG capsule Take 1 capsule (0.25 mcg total) by mouth every Monday, Wednesday, and Friday with hemodialysis. 02/03/20  Yes Regalado, Belkys A, MD  calcium acetate (PHOSLO) 667 MG capsule Take 1 capsule (667 mg total) by mouth 3 (three) times daily with meals. 02/03/20  Yes Regalado, Belkys A, MD  calcium-vitamin D (OSCAL WITH D) 500-200 MG-UNIT TABS tablet Take 1 tablet by mouth as needed (with dialysis). 02/03/20  Yes Regalado, Belkys A, MD  cyanocobalamin (,VITAMIN B-12,) 1000 MCG/ML injection Inject 1,000 mcg into the muscle every 30 (thirty) days.    Yes [provider]  dexamethasone (DECADRON) 4 MG tablet Take 1 tablet (4 mg total) by mouth 3 (three) times daily. 02/21/20  Yes Regalado, Belkys A, MD  ELIQUIS 5 MG TABS tablet Take 2.5 mg by mouth 2 (two) times daily. Takes 1/2 tablet (2.78m) bid 08/23/18  Yes [provider]  fluticasone (FLONASE) 50 MCG/ACT nasal spray Place 1 spray into both nostrils daily as needed for allergies.  03/19/19  Yes [provider]  Fluticasone-Umeclidin-Vilant (TRELEGY ELLIPTA) 100-62.5-25 MCG/INH AEPB Inhale 1 puff into the lungs daily. 01/17/20  Yes WMartyn Ehrich NP  ipratropium-albuterol (DUONEB) 0.5-2.5 (3) MG/3ML SOLN Take 3 mLs by nebulization every 6 (six) hours as needed. Patient taking differently: Take 3 mLs by nebulization every 6 (six) hours as needed (sob/wheezing).  12/30/18  Yes NFenton Foy NP  levothyroxine (SYNTHROID, LEVOTHROID) 125 MCG tablet Take 125 mcg by mouth daily before breakfast.    Yes [provider]  lidocaine-prilocaine (EMLA) cream Apply 1 application topically every Monday, Wednesday, and Friday.  11/29/18  Yes [provider]  methocarbamol (ROBAXIN) 750 MG tablet  Take 1,500 mg by mouth every 8 (eight) hours as needed for muscle spasms. 02/14/20  Yes [provider]  midodrine (PROAMATINE) 10  MG tablet Take 1 tablet (10 mg total) by mouth 3 (three) times daily with meals. 01/24/20  Yes Nahser, Wonda Cheng, MD  nitroGLYCERIN (NITROSTAT) 0.4 MG SL tablet Place 1 tablet (0.4 mg total) under the tongue every 5 (five) minutes as needed for chest pain. 05/03/19 03/08/2020 Yes Nahser, Wonda Cheng, MD  ondansetron (ZOFRAN ODT) 4 MG disintegrating tablet Take 1 tablet (4 mg total) by mouth every 8 (eight) hours as needed for nausea or vomiting. 02/03/20  Yes Regalado, Belkys A, MD  oxyCODONE (OXY IR/ROXICODONE) 5 MG immediate release tablet Take 1 tablet (5 mg total) by mouth every 4 (four) hours as needed for severe pain. 03/03/20  Yes Arrien, Jimmy Picket, MD  OXYGEN Inhale 5 L into the lungs See admin instructions. Use every night and during the day as needed for shortness of breath    Yes [provider]  pantoprazole (PROTONIX) 40 MG tablet Take 1 tablet (40 mg total) by mouth 2 (two) times daily. 02/03/20 03/08/2020 Yes Regalado, Belkys A, MD  sorbitol 70 % solution Take 15 mLs by mouth daily as needed. Patient taking differently: Take 15 mLs by mouth daily as needed (constipation).  02/21/20  Yes Regalado, Belkys A, MD  LORazepam (ATIVAN) 0.5 MG tablet Take 2 tablets (1 mg total) by mouth as needed for anxiety (30 minutes prior to Radiation treatment(s)). 02/23/20   Bruning, Ashlyn, PA-C  metoprolol tartrate (LOPRESSOR) 25 MG tablet Take 25 mg by mouth 2 (two) times daily. 02/10/20   [provider]  Respiratory Therapy Supplies (NEBULIZER) DEVI 1 Device by Does not apply route as needed. 12/07/18   Fenton Foy, NP  sorbitol 70 % SOLN Take 30 mLs by mouth daily as needed for moderate constipation. Patient not taking: Reported on 03/17/2020 02/21/20   Niel Hummer A, MD    Allergies    Mercaptopurine and Remicade [infliximab]  Review of  Systems   Review of Systems  Constitutional: Negative for appetite change, chills and fever.  HENT: Negative for ear pain, rhinorrhea, sneezing and sore throat.   Eyes: Negative for photophobia and visual disturbance.  Respiratory: Positive for cough and shortness of breath. Negative for chest tightness and wheezing.   Cardiovascular: Positive for chest pain and palpitations.  Gastrointestinal: Negative for abdominal pain, blood in stool, constipation, diarrhea, nausea and vomiting.  Genitourinary: Negative for dysuria, hematuria and urgency.  Musculoskeletal: Negative for myalgias.  Skin: Negative for rash.  Neurological: Negative for dizziness, weakness and light-headedness.    Physical Exam Updated Vital Signs BP 113/63   Pulse (!) 142   Temp 98.4 F (36.9 C) (Oral)   Resp (!) 27   SpO2 92%   Physical Exam Vitals and nursing note reviewed.  Constitutional:      General: She is not in acute distress.    Appearance: She is well-developed.     Comments: On nonrebreather.  HENT:     Head: Normocephalic and atraumatic.     Nose: Nose normal.  Eyes:     General: No scleral icterus.       Left eye: No discharge.     Conjunctiva/sclera: Conjunctivae normal.  Cardiovascular:     Rate and Rhythm: Regular rhythm. Tachycardia present.     Heart sounds: Normal heart sounds. No murmur heard.  No friction rub. No gallop.   Pulmonary:     Effort: Pulmonary effort is normal. Tachypnea present. No respiratory distress.     Breath sounds: Normal breath sounds.  Abdominal:  General: Bowel sounds are normal. There is no distension.     Palpations: Abdomen is soft.     Tenderness: There is no abdominal tenderness. There is no guarding.  Musculoskeletal:        General: Normal range of motion.     Cervical back: Normal range of motion and neck supple.     Right lower leg: Edema present.     Left lower leg: Edema present.     Comments: 1+ pitting edema bilateral lower extremities.    Skin:    General: Skin is warm and dry.     Findings: No rash.  Neurological:     Mental Status: She is alert.     Motor: No abnormal muscle tone.     Coordination: Coordination normal.     ED Results / Procedures / Treatments   Labs (all labs ordered are listed, but only abnormal results are displayed) Labs Reviewed  BASIC METABOLIC PANEL - Abnormal; Notable for the following components:      Result Value   BUN 71 (*)    Creatinine, Ser 7.03 (*)    Calcium 8.2 (*)    GFR, Estimated 5 (*)    Anion gap 18 (*)    All other components within normal limits  CBC WITH DIFFERENTIAL/PLATELET - Abnormal; Notable for the following components:   WBC 11.5 (*)    RBC 2.83 (*)    Hemoglobin 8.5 (*)    HCT 28.3 (*)    RDW 17.1 (*)    Platelets 90 (*)    nRBC 0.3 (*)    Neutro Abs 9.8 (*)    Lymphs Abs 0.5 (*)    Abs Immature Granulocytes 0.84 (*)    All other components within normal limits  BRAIN NATRIURETIC PEPTIDE - Abnormal; Notable for the following components:   B Natriuretic Peptide 446.2 (*)    All other components within normal limits  I-STAT VENOUS BLOOD GAS, ED - Abnormal; Notable for the following components:   pCO2, Ven 38.8 (*)    pO2, Ven 46.0 (*)    Acid-base deficit 4.0 (*)    Calcium, Ion 0.97 (*)    HCT 21.0 (*)    Hemoglobin 7.1 (*)    All other components within normal limits  TROPONIN I (HIGH SENSITIVITY) - Abnormal; Notable for the following components:   Troponin I (High Sensitivity) 83 (*)    All other components within normal limits  TROPONIN I (HIGH SENSITIVITY) - Abnormal; Notable for the following components:   Troponin I (High Sensitivity) 106 (*)    All other components within normal limits  RESPIRATORY PANEL BY RT PCR (FLU A&B, COVID)  MRSA PCR SCREENING  CULTURE, BLOOD (ROUTINE X 2)  CULTURE, BLOOD (ROUTINE X 2)  HIV ANTIBODY (ROUTINE TESTING W REFLEX)  MAGNESIUM  PHOSPHORUS  LEGIONELLA PNEUMOPHILA SEROGP 1 UR AG  STREP PNEUMONIAE URINARY  ANTIGEN    EKG EKG Interpretation  Date/Time:  Wednesday March 07 2020 09:08:13 EDT Ventricular Rate:  145 PR Interval:    QRS Duration: 70 QT Interval:  284 QTC Calculation: 441 R Axis:   24 Text Interpretation: Sinus tachycardia Consider right atrial enlargement Borderline T abnormalities, anterior leads Since last tracing rate faster Otherwise no significant change Confirmed by Deno Etienne [id number]) on 02/27/2020 9:54:54 AM   Radiology CT Angio Chest PE W/Cm &/Or Wo Cm  Result Date: 03/21/2020 CLINICAL DATA:  Shortness of breath. EXAM: CT ANGIOGRAPHY CHEST WITH CONTRAST TECHNIQUE: Multidetector CT imaging of the  chest was performed using the standard protocol during bolus administration of intravenous contrast. Multiplanar CT image reconstructions and MIPs were obtained to evaluate the vascular anatomy. CONTRAST:  81m OMNIPAQUE IOHEXOL 350 MG/ML SOLN COMPARISON:  February 29, 2020. FINDINGS: Cardiovascular: Satisfactory opacification of the pulmonary arteries to the segmental level. No evidence of pulmonary embolism. Normal heart size. No pericardial effusion. Atherosclerosis of thoracic aorta is noted without aneurysm or dissection. Mediastinum/Nodes: Status post thyroidectomy. Esophagus is unremarkable. No adenopathy is noted. Lungs/Pleura: No pneumothorax or pleural effusion is noted. Bilateral lower lobe and right upper lobe opacities are noted concerning for multifocal pneumonia. 2.9 cm left upper lobe mass is again noted concerning for malignancy. Upper Abdomen: No acute abnormality. Musculoskeletal: No osseous abnormality is noted. Large amount of subcutaneous emphysema is seen over the left chest wall and supraclavicular region. Review of the MIP images confirms the above findings. IMPRESSION: 1. No definite evidence of pulmonary embolus. 2. Bilateral lower lobe and right upper lobe opacities are noted concerning for multifocal pneumonia. 3. 2.9 cm left upper lobe mass is noted  concerning for malignancy. 4. Large amount of subcutaneous emphysema is seen over the left chest wall and supraclavicular region. 5. Aortic atherosclerosis. Aortic Atherosclerosis (ICD10-I70.0). Electronically Signed   By: JMarijo ConceptionM.D.   On: 03/19/2020 12:52   DG Chest Portable 1 View  Result Date: 02/27/2020 CLINICAL DATA:  Chest pain, shortness of breath. EXAM: PORTABLE CHEST 1 VIEW COMPARISON:  March 03, 2020. FINDINGS: Stable cardiomediastinal silhouette. Minimal left apical pneumothorax is noted. Stable left perihilar mass is noted increased right basilar atelectasis or infiltrate is noted. Stable left basilar atelectasis or infiltrate is noted. Small pleural effusions may be present. Bony thorax is unremarkable. Subcutaneous emphysema is seen over the left lateral chest wall and left supraclavicular region. IMPRESSION: Minimal left apical pneumothorax is noted. Stable left perihilar mass. Increased right basilar atelectasis or infiltrate is noted. Stable left basilar atelectasis or infiltrate is noted. Small pleural effusions may be present. Electronically Signed   By: JMarijo ConceptionM.D.   On: 02/29/2020 09:31    Procedures .Critical Care Performed by: KDelia Heady PA-C Authorized by: KDelia Heady PA-C   Critical care provider statement:    Critical care time (minutes):  35   Critical care was necessary to treat or prevent imminent or life-threatening deterioration of the following conditions:  Respiratory failure, circulatory failure and sepsis   Critical care was time spent personally by me on the following activities:  Development of treatment plan with patient or surrogate, discussions with consultants, obtaining history from patient or surrogate, evaluation of patient's response to treatment, ordering and performing treatments and interventions, ordering and review of laboratory studies, ordering and review of radiographic studies, pulse oximetry, re-evaluation of patient's  condition and review of old charts   I assumed direction of critical care for this patient from another provider in my specialty: no     (including critical care time)  Medications Ordered in ED Medications  vancomycin variable dose per unstable renal function (pharmacist dosing) (has no administration in time range)  vancomycin (VANCOREADY) IVPB 1250 mg/250 mL (has no administration in time range)  ceFEPIme (MAXIPIME) 1 g in sodium chloride 0.9 % 100 mL IVPB (has no administration in time range)  sodium chloride 0.9 % bolus 500 mL (0 mLs Intravenous Stopping Infusion hung by another clincian 03/20/2020 1546)  HYDROmorphone (DILAUDID) injection 1 mg (1 mg Intravenous Given 03/01/2020 1006)  iohexol (OMNIPAQUE) 350 MG/ML injection 100  mL (70 mLs Intravenous Contrast Given 03/03/2020 1223)  LORazepam (ATIVAN) injection 0.5 mg (0.5 mg Intravenous Given 03/10/2020 1225)  ceFEPIme (MAXIPIME) 2 g in sodium chloride 0.9 % 100 mL IVPB (2 g Intravenous New Bag/Given 02/24/2020 1407)  sodium chloride 0.9 % bolus 500 mL (0 mLs Intravenous Stopped 03/19/2020 1546)    ED Course  I have reviewed the triage vital signs and the nursing notes.  Pertinent labs & imaging results that were available during my care of the patient were reviewed by me and considered in my medical decision making (see chart for details).  Clinical Course as of Mar 07 1625  Wed Mar 07, 2020  1040 Patient transition to 3.5 L of oxygen by nasal cannula.   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  Discharged from the hospital on 03/03/2020 for left-sided pneumothorax after bronchoscopy.  She was  discharged home with her usual 3 L of oxygen via nasal cannula.  Last night had worsening shortness of breath, chest pain and palpitations.  She is also had bilateral lower extremity edema which is not typical for her.  Went to dialysis 2 days ago.  Reports a dry cough.  On exam patient initially on nonrebreather.  EKG shows sinus tachycardia without any ischemic changes.  Chest x-ray shows possible small left apical pneumothorax.  Troponin elevated at 83 which is actually decreased a few days ago when it was in the 90s.  VBG overall unremarkable.  Leukocytosis of 11.5.  Creatinine elevated due to her ESRD.  CT angio chest to rule out PE without any evidence of PE but does show multifocal pneumonia and redemonstrates malignancy.  Patient transition to her home oxygen of 3 L.  Her tachycardia has improved from 140s to the 120s with IV fluids and pain control.  She will need to be admitted for ongoing management of her multifocal pneumonia.   Spoke to Dr. Justin Mend, on-call for nephrology.  Told him that patient is a dialysis patient but did not receive dialysis today.  States that they will see her in the morning, if the hospitalist needs to contact them about anything they can do so.   Portions of this note were generated with [employer]. Dictation errors may occur despite best attempts at proofreading.  Final Clinical Impression(s) / ED Diagnoses Final diagnoses:  Multifocal pneumonia    Rx / DC Orders ED Discharge Orders    None       Delia Heady, PA-C 02/27/2020 [place], [place], DO 03/01/2020 1629

## 2020-03-07 NOTE — Progress Notes (Addendum)
Pharmacy Antibiotic Note  Natasha [name] is a 74 y.o. female admitted on 03/22/2020 with pneumonia.  Pharmacy has been consulted for vancomycin and cefepime dosing. This is day 1 of therapy. Pt has been recently hospitalized and was discharged on 03/03/20 for L pneumothorax. Pt is afebrile with WBC of 11.5. Pt has ESRD with HD MWF outpatient.   Plan: vancomcyin 1219m IV loading dose No standing vanc will f/u HD schedule inpatient Cefepime 1g IV q24h  Monitor clinical progress, micro data, HD schedule, and CBC    Temp (24hrs), Avg:98.4 F (36.9 C), Min:98.4 F (36.9 C), Max:98.4 F (36.9 C)  Recent Labs  Lab 02/29/20 1537 02/29/20 1604 03/01/20 0609 03/02/20 0059 03/02/20 1005 03/12/2020 0929  WBC 13.7*  --  14.5*  --  11.9* 11.5*  CREATININE 7.09* 6.80* 8.13* 4.98*  --  7.03*    Estimated Creatinine Clearance: 6.1 mL/min (A) (by C-G formula based on SCr of 7.03 mg/dL (H)).    Allergies  Allergen Reactions  . Mercaptopurine Other (See Comments)    Caused pancreatitis  . Remicade [Infliximab] Other (See Comments)    CAUSED JOINT PAIN    Antimicrobials this admission: Cefepime 10/13> vanc 10/13>   Microbiology results: 10/13 MRSA pcr: ordered 10/13 resp panel: neg   Thank you for allowing pharmacy to be a part of this patient's care.  MCarolin Guernsey PGY1 pharmacy resident 02/28/2020 1:27 PM

## 2020-03-07 NOTE — H&P (Signed)
History and Physical    KHADEEJA ELDEN ZOX:[id number] DOB: [birth year] DOA: 02/26/2020  PCP: [name], MD  Patient coming from: home  I have personally briefly reviewed patient's old medical records in [place]  Chief Complaint: Shortness of breath  HPI: Natasha [name] is a 74 y.o. female with medical history significant of metastatic lung cancer, paroxysmal A. 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  Patient reports worsening shortness of breath that started last night, chest pain, body ache, leg swelling, palpitation.  She had dialysis on Monday.  She is on 3 L of oxygen via nasal cannulae at home.  She denies cough, congestion, fever, chills however reports headache, generalized weakness, lethargic, no energy, shortness of breath even at rest, nausea, vomiting, no appetite.  She recently admitted on 10/5 with left-sided pneumothorax after bronchoscopy status post chest tube placement and discharged home in stable condition on 10/9 on 3 L of oxygen via nasal cannula.  Palliative care was consulted during last admission.  Plan was follow-up with hospice care at home however she have not seen hospice care team yet.  Upon my evaluation: Patient sitting on the bed, has labored breathing, on 3 L oxygen via nasal cannula.  Reports pain all over her body and not able to talk in sentences.   ED Course: Upon arrival to ED: Patient tachycardic, tachypneic, requiring 3 to 4 L of oxygen via nasal cannula, afebrile with leukocytosis of 11.5, platelet: 90, normocytic anemia at baseline, BNP: 446, troponin: 83 trended up to 106.  CTA chest shows no PE, multifocal pneumonia.  COVID-19 pending.  Patient was given Vanco and cefepime, IV fluid, Dilaudid and Ativan in ED.  [facility]ist consulted for admission for multifocal pneumonia.  Review of Systems: As per HPI otherwise negative.    Past Medical History:  Diagnosis Date  . Anal fistula   . Anemia in chronic kidney disease (CKD)   . Anxiety   . Aortic insufficiency    Echo 3/18: mod conc LVH, EF 60-65, no RWMA, Gr 1 DD, mod AI, mild MR, normal RVSF, Trivial TR  . Arthritis   . Borderline hypertension   . Bulging disc    L3-L4  . Cancer ([place])    Brain and spinal cord mets.  . CHF (congestive heart failure) ([place])   . Chronic diarrhea    due to crohn's  . CKD (chronic kidney disease), stage IV (HCC)    MWF- [facility] [place]  . Crohn's disease ([place])    chronic ileitis  . Dyspnea    with exertion  . Emphysema/COPD ([place])   . GERD (gastroesophageal reflux disease)    denies  . History of blood transfusion   . History of glaucoma   . History of kidney stones   . History of pancreatitis    2008--  mercaptopurine  . History of small bowel obstruction    12-03-2010  due to crohn's ileitis  . Hypertension    no longer on medications  . Hypothyroidism, postsurgical    multinodule w/ hurthle cells  . Osteoporosis   . PAF (paroxysmal atrial fibrillation) ([place])   . Perianal Crohn's disease ([place])   . Peripheral vascular disease ([place])    blood clot behind knee left leg  . Polyarthralgia   . Pulmonary embolism ([place])   . Seizures ([place])  03/2017  . Wears partial dentures    upper    Past Surgical History:  Procedure Laterality Date  . ABDOMINAL HYSTERECTOMY  1990   and  Appendectomy  . AV FISTULA PLACEMENT Left 12/08/2017   Procedure: INSERTION OF ARTERIOVENOUS (AV) GRAFT WITH ARTEGRAFT TO LEFT UPPER ARM;  Surgeon: Conrad Baltic, MD;  Location: [facility];  Service: Vascular;  Laterality: Left;  . BASCILIC VEIN TRANSPOSITION Left 01/09/2017   Procedure: LEFT 1ST STAGE BRACHIAL VEIN TRANSPOSITION;  Surgeon: Conrad [place], MD;  Location: [facility];  Service: Vascular;  Laterality: Left;  . BASCILIC  VEIN TRANSPOSITION Left 06/16/2017   Procedure: Second Stage BASILIC VEIN TRANSPOSITION  LEFT ARM;  Surgeon: Conrad [place], MD;  Location: [facility];  Service: Vascular;  Laterality: Left;  . BIOPSY  03/09/2018   Procedure: BIOPSY;  Surgeon: Wilford Corner, MD;  [facility];  Service: Endoscopy;;  . BIOPSY  01/31/2020   Procedure: BIOPSY;  Surgeon: Otis Brace, MD;  Location: [facility];  Service: Gastroenterology;;  . BIOPSY  02/02/2020   Procedure: BIOPSY;  Surgeon: Otis Brace, MD;  Location: [facility];  Service: Gastroenterology;;  . BRONCHIAL BIOPSY  02/28/2020   Procedure: BRONCHIAL BIOPSIES;  Surgeon: Garner Nash, DO;  Location: [facility];  Service: Pulmonary;;  . BRONCHIAL BRUSHINGS  02/28/2020   Procedure: BRONCHIAL BRUSHINGS;  Surgeon: Garner Nash, DO;  Location: [facility];  Service: Pulmonary;;  . BRONCHIAL NEEDLE ASPIRATION BIOPSY  02/28/2020   Procedure: BRONCHIAL NEEDLE ASPIRATION BIOPSIES;  Surgeon: Garner Nash, DO;  Location: [facility];  Service: Pulmonary;;  . BRONCHIAL WASHINGS  02/28/2020   Procedure: BRONCHIAL WASHINGS;  Surgeon: Garner Nash, DO;  Location: [facility];  Service: Pulmonary;;  . CHOLECYSTECTOMY    . COLON RESECTION  x3 --  1978,  1987, 1989   ILEAL RESECTION x2/   [place]  . COLONOSCOPY WITH PROPOFOL N/A 09/25/2014   Procedure: COLONOSCOPY WITH PROPOFOL;  Surgeon: Garlan Fair, MD;  [facility];  Service: Endoscopy;  Laterality: N/A;  . COLONOSCOPY WITH PROPOFOL N/A 03/09/2018   Procedure: COLONOSCOPY WITH PROPOFOL;  Surgeon: Wilford Corner, MD;  [facility];  Service: Endoscopy;  Laterality: N/A;  . COLONOSCOPY WITH PROPOFOL N/A 02/02/2020   Procedure: COLONOSCOPY WITH PROPOFOL;  Surgeon: Otis Brace, MD;  Location: [employer];  Service: Gastroenterology;  Laterality: N/A;  . CYSTOSCOPY W/ URETERAL STENT PLACEMENT Right 11/19/2016   Procedure: CYSTOSCOPY WITH  RIGHT RETROGRADE PYELOGRAM/ URETEROSCOPY WITH LASER AND RIGHT URETERAL STENT PLACEMENT;  Surgeon: Ardis Hughs, MD;  [facility];  Service: Urology;  Laterality: Right;  . ESOPHAGOGASTRODUODENOSCOPY  03/04/2012   Procedure: ESOPHAGOGASTRODUODENOSCOPY (EGD);  Surgeon: Garlan Fair, MD;  [facility];  Service: Endoscopy;  Laterality: N/A;  . ESOPHAGOGASTRODUODENOSCOPY (EGD) WITH PROPOFOL N/A 01/31/2020   Procedure: ESOPHAGOGASTRODUODENOSCOPY (EGD) WITH PROPOFOL;  Surgeon: Otis Brace, MD;  [facility];  Service: Gastroenterology;  Laterality: N/A;  . EXAMINATION UNDER ANESTHESIA N/A 09/12/2014   Procedure: EXAM UNDER ANESTHESIA;  Surgeon: Rolm Bookbinder, MD;  Location: [facility];  Service: General;  Laterality: N/A;  . EYE SURGERY     laser  . FLEXIBLE SIGMOIDOSCOPY  03/04/2012   Procedure: FLEXIBLE SIGMOIDOSCOPY;  Surgeon: Garlan Fair, MD;  [facility];  Service: Endoscopy;  Laterality: N/A;  . GLAUCOMA SURGERY Bilateral   . HOLMIUM LASER APPLICATION Right 6/96/2952   Procedure: HOLMIUM LASER APPLICATION;  Surgeon: Ardis Hughs, MD;  Location[facility];  Service: Urology;  Laterality: Right;  . INCISION AND DRAINAGE PERIRECTAL ABSCESS N/A 09/12/2014   Procedure: IRRIGATION AND DEBRIDEMENT PERIRECTAL ABSCESS;  Surgeon: Rolm Bookbinder, MD;  Location: [facility];  Service: General;  Laterality: N/A;  . IR FLUORO GUIDE CV LINE RIGHT  03/20/2017  . IR US GUIDE VASC ACCESS RIGHT  03/20/2017  . NEGATIVE SLEEP STUDY  2008  . PLACEMENT OF SETON N/A 12/01/2014   Procedure: PLACEMENT OF SETON;  Surgeon: [name], MD;  Location: [facility];  Service: General;  Laterality: N/A;  . POLYPECTOMY  03/09/2018   Procedure: POLYPECTOMY;  Surgeon: Wilford Corner, MD;  [facility];  Service: Endoscopy;;  . POLYPECTOMY  02/02/2020   Procedure: POLYPECTOMY;  Surgeon: Otis Brace, MD;  Location: [facility];  Service:  Gastroenterology;;  . RECTAL EXAM UNDER ANESTHESIA N/A 12/01/2014   Procedure: RECTAL EXAM UNDER ANESTHESIA;  Surgeon: [name], MD;  Location: [facility];  Service: General;  Laterality: N/A;  . TOTAL THYROIDECTOMY  10-13-2003  . TRANSTHORACIC ECHOCARDIOGRAM  07-11-2013   mild LVH,  ef 55%,  moderate AR,  mild MR and TR,  trivial PR  . TUBAL LIGATION    . VIDEO BRONCHOSCOPY WITH ENDOBRONCHIAL NAVIGATION N/A 02/28/2020   Procedure: VIDEO BRONCHOSCOPY WITH ENDOBRONCHIAL NAVIGATION;  Surgeon: Garner Nash, DO;  Location: [place];  Service: Pulmonary;  Laterality: N/A;     reports that she quit smoking about 2 years ago. Her smoking use included cigarettes. She started smoking about 53 years ago. She has a 35.00 pack-year smoking history. She has never used smokeless tobacco. She reports that she does not drink alcohol and does not use drugs.  Allergies  Allergen Reactions  . Mercaptopurine Other (See Comments)    Caused pancreatitis  . Remicade [Infliximab] Other (See Comments)    CAUSED JOINT PAIN    Family History  Problem Relation Age of Onset  . Hypertension Father   . Heart disease Father   . Heart attack Father   . Diverticulitis Mother   . COPD Mother   . Hypertension Mother   . Heart disease Mother   . Heart attack Mother   . Heart attack Brother   . Colon cancer Brother   . Diabetes Brother   . Thyroid disease Brother     Prior to Admission medications   Medication Sig Start Date End Date Taking? Authorizing Provider  acetaminophen (TYLENOL) 500 MG tablet Take 1,000 mg by mouth every 6 (six) hours as needed for mild pain.    Yes [provider]  Adalimumab (HUMIRA) 40 MG/0.8ML PSKT Inject 40 mg into the skin every 14 (fourteen) days.    Yes [provider]  albuterol (VENTOLIN HFA) 108 (90 Base) MCG/ACT inhaler INHALE 2 PUFFS BY MOUTH EVERY 6 HOURS IF NEEDED FOR WHEEZING OR SHORTNESS OF BREATH Patient taking differently:  Inhale 2 puffs into the lungs every 6 (six) hours as needed for wheezing or shortness of breath. INHALE 2 PUFFS BY MOUTH EVERY 6 HOURS IF NEEDED FOR WHEEZING OR SHORTNESS OF BREATH 05/31/19  Yes Chesley Mires, MD  calcitRIOL (ROCALTROL) 0.25 MCG capsule Take 1 capsule (0.25 mcg total) by mouth every Monday, Wednesday, and Friday with hemodialysis. 02/03/20  Yes Regalado, Belkys A, MD  calcium acetate (PHOSLO) 667 MG capsule Take 1 capsule (667 mg total) by mouth 3 (three) times daily with meals. 02/03/20  Yes Regalado, Belkys A, MD  calcium-vitamin D (OSCAL WITH D) 500-200 MG-UNIT TABS  tablet Take 1 tablet by mouth as needed (with dialysis). 02/03/20  Yes Regalado, Belkys A, MD  cyanocobalamin (,VITAMIN B-12,) 1000 MCG/ML injection Inject 1,000 mcg into the muscle every 30 (thirty) days.    Yes [provider]  dexamethasone (DECADRON) 4 MG tablet Take 1 tablet (4 mg total) by mouth 3 (three) times daily. 02/21/20  Yes Regalado, Belkys A, MD  ELIQUIS 5 MG TABS tablet Take 2.5 mg by mouth 2 (two) times daily. Takes 1/2 tablet (2.69m) bid 08/23/18  Yes [provider]  fluticasone (FLONASE) 50 MCG/ACT nasal spray Place 1 spray into both nostrils daily as needed for allergies.  03/19/19  Yes [provider]  Fluticasone-Umeclidin-Vilant (TRELEGY ELLIPTA) 100-62.5-25 MCG/INH AEPB Inhale 1 puff into the lungs daily. 01/17/20  Yes WMartyn Ehrich NP  ipratropium-albuterol (DUONEB) 0.5-2.5 (3) MG/3ML SOLN Take 3 mLs by nebulization every 6 (six) hours as needed. Patient taking differently: Take 3 mLs by nebulization every 6 (six) hours as needed (sob/wheezing).  12/30/18  Yes NFenton Foy NP  levothyroxine (SYNTHROID, LEVOTHROID) 125 MCG tablet Take 125 mcg by mouth daily before breakfast.    Yes [provider]  lidocaine-prilocaine (EMLA) cream Apply 1 application topically every Monday, Wednesday, and Friday.  11/29/18  Yes [provider]  methocarbamol (ROBAXIN)  750 MG tablet Take 1,500 mg by mouth every 8 (eight) hours as needed for muscle spasms. 02/14/20  Yes [provider]  midodrine (PROAMATINE) 10 MG tablet Take 1 tablet (10 mg total) by mouth 3 (three) times daily with meals. 01/24/20  Yes Nahser, PWonda Cheng MD  nitroGLYCERIN (NITROSTAT) 0.4 MG SL tablet Place 1 tablet (0.4 mg total) under the tongue every 5 (five) minutes as needed for chest pain. 05/03/19 03/17/2020 Yes Nahser, PWonda Cheng MD  ondansetron (ZOFRAN ODT) 4 MG disintegrating tablet Take 1 tablet (4 mg total) by mouth every 8 (eight) hours as needed for nausea or vomiting. 02/03/20  Yes Regalado, Belkys A, MD  oxyCODONE (OXY IR/ROXICODONE) 5 MG immediate release tablet Take 1 tablet (5 mg total) by mouth every 4 (four) hours as needed for severe pain. 03/03/20  Yes Arrien, MJimmy Picket MD  OXYGEN Inhale 5 L into the lungs See admin instructions. Use every night and during the day as needed for shortness of breath    Yes [provider]  pantoprazole (PROTONIX) 40 MG tablet Take 1 tablet (40 mg total) by mouth 2 (two) times daily. 02/03/20 02/27/2020 Yes Regalado, Belkys A, MD  sorbitol 70 % solution Take 15 mLs by mouth daily as needed. Patient taking differently: Take 15 mLs by mouth daily as needed (constipation).  02/21/20  Yes Regalado, Belkys A, MD  LORazepam (ATIVAN) 0.5 MG tablet Take 2 tablets (1 mg total) by mouth as needed for anxiety (30 minutes prior to Radiation treatment(s)). 02/23/20   Bruning, Ashlyn, PA-C  metoprolol tartrate (LOPRESSOR) 25 MG tablet Take 25 mg by mouth 2 (two) times daily. 02/10/20   [provider]  Respiratory Therapy Supplies (NEBULIZER) DEVI 1 Device by Does not apply route as needed. 12/07/18   NFenton Foy NP  sorbitol 70 % SOLN Take 30 mLs by mouth daily as needed for moderate constipation. Patient not taking: Reported on 03/22/2020 02/21/20   RElmarie Shiley MD    Physical Exam: Vitals:   03/06/2020 1045 03/11/2020 1115  03/19/2020 1145 03/15/2020 1315  BP: 107/64 108/60 107/69   Pulse: (!) 130 (!) 129  (!) 129  Resp: ([name]  26 18 17    Temp:      TempSrc:      SpO2: 100% 100% 98% 100%    Constitutional: In respiratory distress-on 3 L of oxygen via nasal cannula.  Appears weak, lethargic and using accessory muscles for breathing Eyes: PERRL, lids and conjunctivae normal ENMT: Mucous membranes are moist. Posterior pharynx clear of any exudate or lesions.Normal dentition.  Neck: normal, supple, no masses, no thyromegaly Respiratory: Tachypneic, coarse breath sounds noted bilaterally. Cardiovascular: Tachycardic, no murmurs / rubs / gallops.  3+ bilateral pitting edema positive. 2+ pedal pulses. No carotid bruits.  Abdomen: no tenderness, no masses palpated. No hepatosplenomegaly. Bowel sounds positive.  Musculoskeletal: no clubbing / cyanosis. No joint deformity upper and lower extremities. Good ROM, no contractures. Normal muscle tone.  Skin: no rashes, lesions, ulcers. No induration Neurologic: CN 2-12 grossly intact. Sensation intact, DTR normal. Strength 5/5 in all 4.  Psychiatric: Normal judgment and insight. Alert and oriented x 3. Normal mood.    Labs on Admission: I have personally reviewed following labs and imaging studies  CBC: Recent Labs  Lab 02/29/20 1537 02/29/20 1604 02/29/20 1609 03/01/20 0609 03/02/20 1005 02/26/2020 0929 03/11/2020 1343  WBC 13.7*  --   --  14.5* 11.9* 11.5*  --   NEUTROABS 12.6*  --   --   --   --  9.8*  --   HGB 6.9*   < > 5.8* 8.6* 7.5* 8.5* 7.1*  HCT 22.3*   < > 17.0* 27.3* 23.2* 28.3* 21.0*  MCV 96.1  --   --  96.1 93.9 100.0  --   PLT 132*  --   --  110* 87* 90*  --    < > = values in this interval not displayed.   Basic Metabolic Panel: Recent Labs  Lab 02/29/20 1537 02/29/20 1537 02/29/20 1604 02/29/20 1604 02/29/20 1609 03/01/20 0609 03/02/20 0059 03/16/2020 0929 03/20/2020 1343  NA 140   < > 139   < > 139 143 138 140 138  K 5.2*   < > 4.4   < > 4.5  6.6* 5.3* 4.1 4.0  CL 103  --  110  --   --  102 98 100  --   CO2 21*  --   --   --   --  23 28 22   --   GLUCOSE 195*  --  143*  --   --  105* 105* 89  --   BUN 78*  --  70*  --   --  96* 51* 71*  --   CREATININE 7.09*  --  6.80*  --   --  8.13* 4.98* 7.03*  --   CALCIUM 8.1*  --   --   --   --  8.5* 8.2* 8.2*  --   PHOS  --   --   --   --   --  >30.0* 8.1*  --   --    < > = values in this interval not displayed.   GFR: Estimated Creatinine Clearance: 6.1 mL/min (A) (by C-G formula based on SCr of 7.03 mg/dL (H)). Liver Function Tests: Recent Labs  Lab 02/29/20 1537 03/01/20 0609 03/02/20 0059  AST 17  --   --   ALT 17  --   --   ALKPHOS 58  --   --   BILITOT 0.7  --   --   PROT 5.6*  --   --   ALBUMIN 3.0* 2.8* 2.6*  No results for input(s): LIPASE, AMYLASE in the last 168 hours. No results for input(s): AMMONIA in the last 168 hours. Coagulation Profile: Recent Labs  Lab 02/29/20 1644  INR 1.0   Cardiac Enzymes: No results for input(s): CKTOTAL, CKMB, CKMBINDEX, TROPONINI in the last 168 hours. BNP (last 3 results) No results for input(s): PROBNP in the last 8760 hours. HbA1C: No results for input(s): HGBA1C in the last 72 hours. CBG: No results for input(s): GLUCAP in the last 168 hours. Lipid Profile: No results for input(s): CHOL, HDL, LDLCALC, TRIG, CHOLHDL, LDLDIRECT in the last 72 hours. Thyroid Function Tests: No results for input(s): TSH, T4TOTAL, FREET4, T3FREE, THYROIDAB in the last 72 hours. Anemia Panel: No results for input(s): VITAMINB12, FOLATE, FERRITIN, TIBC, IRON, RETICCTPCT in the last 72 hours. Urine analysis:    Component Value Date/Time   COLORURINE YELLOW 11/18/2019 2138   APPEARANCEUR CLEAR 11/18/2019 2138   LABSPEC 1.019 11/18/2019 2138   PHURINE 6.0 11/18/2019 2138   GLUCOSEU 50 (A) 11/18/2019 2138   HGBUR MODERATE (A) 11/18/2019 2138   BILIRUBINUR NEGATIVE 11/18/2019 2138   [place] NEGATIVE 11/18/2019 2138   PROTEINUR 30 (A)  11/18/2019 2138   UROBILINOGEN 0.2 09/09/2014 0850   NITRITE NEGATIVE 11/18/2019 2138   LEUKOCYTESUR NEGATIVE 11/18/2019 2138    Radiological Exams on Admission: CT Angio Chest PE W/Cm &/Or Wo Cm  Result Date: 03/09/2020 CLINICAL DATA:  Shortness of breath. EXAM: CT ANGIOGRAPHY CHEST WITH CONTRAST TECHNIQUE: Multidetector CT imaging of the chest was performed using the standard protocol during bolus administration of intravenous contrast. Multiplanar CT image reconstructions and MIPs were obtained to evaluate the vascular anatomy. CONTRAST:  65m OMNIPAQUE IOHEXOL 350 MG/ML SOLN COMPARISON:  February 29, 2020. FINDINGS: Cardiovascular: Satisfactory opacification of the pulmonary arteries to the segmental level. No evidence of pulmonary embolism. Normal heart size. No pericardial effusion. Atherosclerosis of thoracic aorta is noted without aneurysm or dissection. Mediastinum/Nodes: Status post thyroidectomy. Esophagus is unremarkable. No adenopathy is noted. Lungs/Pleura: No pneumothorax or pleural effusion is noted. Bilateral lower lobe and right upper lobe opacities are noted concerning for multifocal pneumonia. 2.9 cm left upper lobe mass is again noted concerning for malignancy. Upper Abdomen: No acute abnormality. Musculoskeletal: No osseous abnormality is noted. Large amount of subcutaneous emphysema is seen over the left chest wall and supraclavicular region. Review of the MIP images confirms the above findings. IMPRESSION: 1. No definite evidence of pulmonary embolus. 2. Bilateral lower lobe and right upper lobe opacities are noted concerning for multifocal pneumonia. 3. 2.9 cm left upper lobe mass is noted concerning for malignancy. 4. Large amount of subcutaneous emphysema is seen over the left chest wall and supraclavicular region. 5. Aortic atherosclerosis. Aortic Atherosclerosis (ICD10-I70.0). Electronically Signed   By: JMarijo ConceptionM.D.   On: 03/12/2020 12:52   DG Chest Portable 1  View  Result Date: 03/16/2020 CLINICAL DATA:  Chest pain, shortness of breath. EXAM: PORTABLE CHEST 1 VIEW COMPARISON:  March 03, 2020. FINDINGS: Stable cardiomediastinal silhouette. Minimal left apical pneumothorax is noted. Stable left perihilar mass is noted increased right basilar atelectasis or infiltrate is noted. Stable left basilar atelectasis or infiltrate is noted. Small pleural effusions may be present. Bony thorax is unremarkable. Subcutaneous emphysema is seen over the left lateral chest wall and left supraclavicular region. IMPRESSION: Minimal left apical pneumothorax is noted. Stable left perihilar mass. Increased right basilar atelectasis or infiltrate is noted. Stable left basilar atelectasis or infiltrate is noted. Small pleural effusions may be present.  Electronically Signed   By: Marijo Conception M.D.   On: 03/17/2020 09:31    EKG: Independently reviewed.  Sinus tachycardia with heart rate in 145.  Assessment/Plan Principal Problem:   Multifocal pneumonia Active Problems:   ESRD on dialysis ([place])   Hypothyroidism   Emphysema/COPD (HCC)   Thrombocytopenia (HCC)   Chronic respiratory failure with hypoxia (HCC)   Metastatic lung carcinoma, left (HCC)   Elevated troponin   PAF (paroxysmal atrial fibrillation) ([place])    Multifocal pneumonia: -Patient presented with worsening shortness of breath.  Requiring 3 to 4 L of oxygen via nasal cannula which is her baseline. -Reviewed CTA chest-no PE.  Patient was given cefepime, vancomycin, IV fluids in ED. COVID-19 is negative. -Admit patient at stepdown unit for close monitoring.  On continuous pulse ox.  On telemetry. -Continue Vanco and cefepime. -Get blood culture, urine strep antigen, urine Legionella antigen. -Monitor vitals closely.  DuoNebs as needed. -Continue Decadron, Ativan -Patient has multiple comorbidities including stage IV lung cancer-consult palliative care to discuss goals of care.  ESRD on dialysis/high anion  gap metabolic acidosis: (MWF).  Last dialysis on Monday.  EDP consulted nephrology.  Potassium is WNL.  Elevated troponin: -Initial troponin 83-trended up to 106.  Likely demand ischemia-in the setting of multifocal pneumonia and A. fib with RVR.  Reviewed EKG.  Trend troponin.    Anemia of chronic disease: H&H is stable.  Likely in the setting of ESRD.  Continue to monitor  Thrombocytopenia: Platelet: 90 -No signs of active bleeding.  Continue to monitor  Paroxysmal A. fib: Heart rate noted to be in 140s. -Continue Eliquis. -Start patient on Cardizem drip.  Monitor heart rate closely on telemetry.  Hypothyroidism: Continue levothyroxine  GERD: Continue PPI  Please note: Patient is on increased risk of morbidity and mortality. I discussed with patient's husband at bedside about poor prognosis of patient.  I discussed about comfort measures however patient's husband tells me that he would like to discuss with his family and will let us know as soon as possible.  I also consulted palliative care.  He is okay to start on Cardizem drip and continue IV antibiotics at this time.  DVT prophylaxis: Eliquis/SCD Code Status: DNR/DNI  family Communication: None present at bedside.  Plan of care discussed with patient in length and he verbalized understanding and agreed with it. Disposition Plan: To be determined Consults called: Nephrology and palliative care Admission status: Inpatient   Mckinley Jewel MD [facility]  If 7PM-7AM, please contact night-coverage [email or web address] Password [facility]  03/08/2020, 3:28 PM

## 2020-03-07 NOTE — Discharge Planning (Signed)
Pt currently active with [facility] for [facility] services.

## 2020-03-07 NOTE — ED Notes (Signed)
Admitting at bedside informed pt difficult obtain IV access d/t dialysis and arm restriction. IV team order placed

## 2020-03-08 DIAGNOSIS — J189 Pneumonia, unspecified organism: Secondary | ICD-10-CM | POA: Diagnosis not present

## 2020-03-08 DIAGNOSIS — I480 Paroxysmal atrial fibrillation: Secondary | ICD-10-CM

## 2020-03-08 DIAGNOSIS — A419 Sepsis, unspecified organism: Principal | ICD-10-CM

## 2020-03-08 DIAGNOSIS — J439 Emphysema, unspecified: Secondary | ICD-10-CM

## 2020-03-08 DIAGNOSIS — R778 Other specified abnormalities of plasma proteins: Secondary | ICD-10-CM

## 2020-03-08 DIAGNOSIS — N186 End stage renal disease: Secondary | ICD-10-CM

## 2020-03-08 DIAGNOSIS — Z992 Dependence on renal dialysis: Secondary | ICD-10-CM

## 2020-03-08 DIAGNOSIS — J9611 Chronic respiratory failure with hypoxia: Secondary | ICD-10-CM

## 2020-03-08 LAB — CBC WITH DIFFERENTIAL/PLATELET
Abs Immature Granulocytes: 1.12 10*3/uL — ABNORMAL HIGH (ref 0.00–0.07)
Basophils Absolute: 0.1 10*3/uL (ref 0.0–0.1)
Basophils Relative: 0 %
Eosinophils Absolute: 0 10*3/uL (ref 0.0–0.5)
Eosinophils Relative: 0 %
HCT: 22.8 % — ABNORMAL LOW (ref 36.0–46.0)
Hemoglobin: 7 g/dL — ABNORMAL LOW (ref 12.0–15.0)
Immature Granulocytes: 8 %
Lymphocytes Relative: 3 %
Lymphs Abs: 0.5 10*3/uL — ABNORMAL LOW (ref 0.7–4.0)
MCH: 30.8 pg (ref 26.0–34.0)
MCHC: 30.7 g/dL (ref 30.0–36.0)
MCV: 100.4 fL — ABNORMAL HIGH (ref 80.0–100.0)
Monocytes Absolute: 0.5 10*3/uL (ref 0.1–1.0)
Monocytes Relative: 4 %
Neutro Abs: 11.2 10*3/uL — ABNORMAL HIGH (ref 1.7–7.7)
Neutrophils Relative %: 85 %
Platelets: 64 10*3/uL — ABNORMAL LOW (ref 150–400)
RBC: 2.27 MIL/uL — ABNORMAL LOW (ref 3.87–5.11)
RDW: 17.7 % — ABNORMAL HIGH (ref 11.5–15.5)
WBC: 13.4 10*3/uL — ABNORMAL HIGH (ref 4.0–10.5)
nRBC: 0.1 % (ref 0.0–0.2)

## 2020-03-08 LAB — PREPARE RBC (CROSSMATCH)

## 2020-03-08 LAB — COMPREHENSIVE METABOLIC PANEL
ALT: 20 U/L (ref 0–44)
AST: 27 U/L (ref 15–41)
Albumin: 1.7 g/dL — ABNORMAL LOW (ref 3.5–5.0)
Alkaline Phosphatase: 62 U/L (ref 38–126)
Anion gap: 19 — ABNORMAL HIGH (ref 5–15)
BUN: 86 mg/dL — ABNORMAL HIGH (ref 8–23)
CO2: 18 mmol/L — ABNORMAL LOW (ref 22–32)
Calcium: 7.6 mg/dL — ABNORMAL LOW (ref 8.9–10.3)
Chloride: 104 mmol/L (ref 98–111)
Creatinine, Ser: 7.95 mg/dL — ABNORMAL HIGH (ref 0.44–1.00)
GFR, Estimated: 5 mL/min — ABNORMAL LOW (ref >60)
Glucose, Bld: 64 mg/dL — ABNORMAL LOW (ref 70–99)
Potassium: 5 mmol/L (ref 3.5–5.1)
Sodium: 141 mmol/L (ref 135–145)
Total Bilirubin: 0.9 mg/dL (ref 0.3–1.2)
Total Protein: 4.6 g/dL — ABNORMAL LOW (ref 6.5–8.1)

## 2020-03-08 LAB — TROPONIN I (HIGH SENSITIVITY): Troponin I (High Sensitivity): 205 ng/L (ref <18)

## 2020-03-08 LAB — CYTOLOGY - NON PAP

## 2020-03-08 LAB — HEMOGLOBIN AND HEMATOCRIT, BLOOD
HCT: 27 % — ABNORMAL LOW (ref 36.0–46.0)
Hemoglobin: 8.7 g/dL — ABNORMAL LOW (ref 12.0–15.0)

## 2020-03-08 MED ORDER — VANCOMYCIN HCL IN DEXTROSE 500-5 MG/100ML-% IV SOLN: 500.0000 mg | Freq: Once | INTRAVENOUS | Status: DC

## 2020-03-08 MED ORDER — TRAMADOL HCL 50 MG PO TABS
50.0000 mg | ORAL_TABLET | Freq: Four times a day (QID) | ORAL | Status: DC | PRN
  Administered 2020-03-08 – 2020-03-09 (×3): 50 mg via ORAL
  Filled 2020-03-08 (×3): qty 1

## 2020-03-08 MED ORDER — DILTIAZEM HCL 60 MG PO TABS
60.0000 mg | ORAL_TABLET | Freq: Four times a day (QID) | ORAL | Status: DC
  Administered 2020-03-08 – 2020-03-12 (×17): 60 mg via ORAL
  Filled 2020-03-08 (×17): qty 1

## 2020-03-08 MED ORDER — CHLORHEXIDINE GLUCONATE CLOTH 2 % EX PADS
6.0000 | MEDICATED_PAD | Freq: Every day | CUTANEOUS | Status: DC
  Administered 2020-03-09: 6 via TOPICAL

## 2020-03-08 MED ORDER — SODIUM CHLORIDE 0.9% IV SOLUTION: Freq: Once | INTRAVENOUS | Status: DC

## 2020-03-08 NOTE — Progress Notes (Addendum)
[facility] PROGRESS NOTE    Progress Note  Natasha [name]  BCW:[id number] DOB: 03-27-1946 DOA: 03/20/2020 PCP: [name], MD     Brief Narrative:   Natasha [name] is an 74 y.o. female past medical history metastatic lung cancer, paroxysmal atrial fibrillation on Eliquis congestive heart failure history of DVT and PE on Eliquis end-stage renal disease Monday Wednesday and Friday seizure disorder, with recent admission for left-sided pneumothorax on 02/28/2020 post bronchoscopy with status post chest tube placement and discharged home on 03/03/2020 on 3 L of oxygen palliative care was consulted at that time comes into the hospital for worsening shortness of breath.  She relates that started the day prior to admission she is also been having chest pain body aches and lower extremity swelling accompanied by palpitations.  She had her dialysis the day prior to admission.  In the ED she was found tachypneic tachycardic requiring 4 L of oxygen with leukocytosis of 11 platelet count of 19, BNP of 446 troponins of 83 CT angio of the chest showed no PE but it did show multifocal pneumonia SARS-CoV-2 and influenza PCR  Assessment/Plan:   Present on admission Sepsis due to multifocal pneumonia/acute respiratory failure on chronic: Patient presented with worsening shortness of breath requiring 4 L of oxygen to keep saturations greater than 90%. CT angio of the chest was personally reviewed showed no PE, but it did show bilateral lower lobe infiltrates concerning for pneumonia.  A meter left upper lobe mass and large amounts of subcutaneous emphysema over the left chest wall She was started on IV vancomycin and cefepime on admission he has remained afebrile since then her tachycardia and tachypnea have improved. She still has a persistent leukocytosis  ESRD on dialysis [facility]): High anion gap metabolic acidosis, nephrology was consulted Alysis today 03/08/2020.  Elevated  troponins: They have basically remained flat likely demand ischemia in the setting of renal failure, multifocal pneumonia and A. 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  Thrombocytopenia: No signs of bleeding.  Paroxysmal atrial fibrillation with RVR: She was continue on Eliquis started on a Cardizem drip improved will transition to oral Cardizem the RVR is probably sepsis driven. Her heart rate has been less than 100 persistently, will transition IV diltiazem to oral diltiazem. Eliquis.  Hypothyroidism: Continue Synthroid.    Metastatic lung carcinoma, left (HCC)     DVT prophylaxis: Eliquis Family Communication:Husband Status is: Inpatient  Remains inpatient appropriate because:Hemodynamically unstable   Dispo: The patient is from: Home              Anticipated d/c is to: Home              Anticipated d/c date is: > 3 days              Patient currently is not medically stable to d/c.        Code Status:     Code Status Orders  (From admission, onward)         Start     Ordered   03/15/2020 1526  Do not attempt resuscitation (DNR)  Continuous       Question Answer Comment  In the event of cardiac or respiratory ARREST Do not call a "code blue"   In the event of cardiac or respiratory ARREST Do not perform Intubation, CPR, defibrillation or ACLS   In the event of cardiac or respiratory ARREST Use medication by any route,  position, wound care, and other measures to relive pain and suffering. May use oxygen, suction and manual treatment of airway obstruction as needed for comfort.      03/24/2020 1527        Code Status History    Date Active Date Inactive Code Status Order ID Comments User Context   03/02/2020 1143 03/03/2020 2238 Partial Code [id number]  Rosezella Rumpf, NP Inpatient   02/29/2020 1928 03/02/2020 1142 Partial Code [id number]  Marcelyn Bruins, MD ED   02/15/2020 1350 02/21/2020 2240 DNR [id number]   Mckinley Jewel, MD ED   01/30/2020 2318 02/03/2020 2347 Full Code [id number]  Rise Patience, MD Inpatient   11/18/2019 1414 11/21/2019 0010 Full Code [id number]  Norval Morton, MD ED   04/13/2019 1637 04/16/2019 2127 Full Code [id number]  Norval Morton, MD ED   05/28/2018 1827 06/06/2018 1658 Full Code [id number]  Merton Border, MD ED   03/31/2018 0512 04/02/2018 2218 Full Code [id number]  Vianne Bulls, MD ED   03/20/2017 0129 03/28/2017 0051 Full Code [id number]  Phillips Grout, MD Inpatient   11/18/2016 1434 11/21/2016 1827 Full Code [id number]  Waldemar Dickens, MD ED   07/12/2016 0137 07/15/2016 1647 Full Code [id number]  Lily Kocher, MD ED   10/12/2015 2313 10/15/2015 1657 Full Code [id number]  Toy Baker, MD Inpatient   09/09/2014 1339 09/14/2014 2240 Full Code [id number]  Nita Sells, MD Inpatient   03/29/2014 2231 04/07/2014 1453 Full Code [id number]  Shanda Howells, MD ED   05/12/2013 2132 05/18/2013 1605 Full Code [id number]  Berle Mull, MD Inpatient   Advance Care Planning Activity        IV Access:    Peripheral IV   Procedures and diagnostic studies:   CT Angio Chest PE W/Cm &/Or Wo Cm  Result Date: 03/15/2020 CLINICAL DATA:  Shortness of breath. EXAM: CT ANGIOGRAPHY CHEST WITH CONTRAST TECHNIQUE: Multidetector CT imaging of the chest was performed using the standard protocol during bolus administration of intravenous contrast. Multiplanar CT image reconstructions and MIPs were obtained to evaluate the vascular anatomy. CONTRAST:  49m OMNIPAQUE IOHEXOL 350 MG/ML SOLN COMPARISON:  February 29, 2020. FINDINGS: Cardiovascular: Satisfactory opacification of the pulmonary arteries to the segmental level. No evidence of pulmonary embolism. Normal heart size. No pericardial effusion. Atherosclerosis of thoracic aorta is noted without aneurysm or dissection. Mediastinum/Nodes: Status post thyroidectomy. Esophagus is unremarkable. No adenopathy is noted. Lungs/Pleura: No  pneumothorax or pleural effusion is noted. Bilateral lower lobe and right upper lobe opacities are noted concerning for multifocal pneumonia. 2.9 cm left upper lobe mass is again noted concerning for malignancy. Upper Abdomen: No acute abnormality. Musculoskeletal: No osseous abnormality is noted. Large amount of subcutaneous emphysema is seen over the left chest wall and supraclavicular region. Review of the MIP images confirms the above findings. IMPRESSION: 1. No definite evidence of pulmonary embolus. 2. Bilateral lower lobe and right upper lobe opacities are noted concerning for multifocal pneumonia. 3. 2.9 cm left upper lobe mass is noted concerning for malignancy. 4. Large amount of subcutaneous emphysema is seen over the left chest wall and supraclavicular region. 5. Aortic atherosclerosis. Aortic Atherosclerosis (ICD10-I70.0). Electronically Signed   By: JMarijo ConceptionM.D.   On: 03/19/2020 12:52   DG Chest Portable 1 View  Result Date: 03/11/2020 CLINICAL DATA:  Chest pain, shortness of breath. EXAM: PORTABLE CHEST 1 VIEW COMPARISON:  March 03, 2020. FINDINGS: Stable cardiomediastinal silhouette. Minimal left apical pneumothorax is noted. Stable left perihilar  mass is noted increased right basilar atelectasis or infiltrate is noted. Stable left basilar atelectasis or infiltrate is noted. Small pleural effusions may be present. Bony thorax is unremarkable. Subcutaneous emphysema is seen over the left lateral chest wall and left supraclavicular region. IMPRESSION: Minimal left apical pneumothorax is noted. Stable left perihilar mass. Increased right basilar atelectasis or infiltrate is noted. Stable left basilar atelectasis or infiltrate is noted. Small pleural effusions may be present. Electronically Signed   By: Marijo Conception M.D.   On: 02/29/2020 09:31     Medical Consultants:    None.  Anti-Infectives:   vanc and cefepime  Subjective:    Sherrie Mustache she relates her breathing  is not better today she still feels tired fatigue and anorexic  Objective:    Vitals:   03/08/20 0259 03/08/20 0300 03/08/20 0346 03/08/20 0400  BP: (!) 114/58 116/62 116/62 99/64  Pulse: (!) 113 99 99   Resp: 18 18    Temp: 97.9 F (36.6 C) (!) 97.3 F (36.3 C) (!) 97.3 F (36.3 C)   TempSrc: Oral  Oral   SpO2:      Weight: 53.1 kg     Height: 5' 4"  (1.626 m)      SpO2: 97 % O2 Flow Rate (L/min): 5 L/min   Intake/Output Summary (Last 24 hours) at 03/08/2020 0832 Last data filed at 03/08/2020 0036 Gross per 24 hour  Intake 250.48 ml  Output --  Net 250.48 ml   Filed Weights   03/02/2020 2200 03/08/20 0259  Weight: 53.1 kg 53.1 kg    Exam: General exam: In no acute distress. Respiratory system: Good air movement and diffuse crackles at bases Cardiovascular system: S1 & S2 heard, RRR. No JVD. Gastrointestinal system: Abdomen is nondistended, soft and nontender.  Extremities: No pedal edema. Skin: No rashes, lesions or ulcers Psychiatry: Judgement and insight appear normal. Mood & affect appropriate.    Data Reviewed:    Labs: Basic Metabolic Panel: Recent Labs  Lab 03/02/20 0059 03/02/20 0059 03/15/2020 0929 02/26/2020 0929 03/02/2020 1339 03/20/2020 1343 03/08/20 0255  NA 138  --  140  --   --  138 141  K 5.3*   < > 4.1   < >  --  4.0 5.0  CL 98  --  100  --   --   --  104  CO2 28  --  22  --   --   --  18*  GLUCOSE 105*  --  89  --   --   --  64*  BUN 51*  --  71*  --   --   --  86*  CREATININE 4.98*  --  7.03*  --   --   --  7.95*  CALCIUM 8.2*  --  8.2*  --   --   --  7.6*  MG  --   --   --   --  1.5*  --   --   PHOS 8.1*  --   --   --  5.3*  --   --    < > = values in this interval not displayed.   GFR Estimated Creatinine Clearance: 5.3 mL/min (A) (by C-G formula based on SCr of 7.95 mg/dL (H)). Liver Function Tests: Recent Labs  Lab 03/02/20 0059 03/08/20 0255  AST  --  27  ALT  --  20  ALKPHOS  --  62  BILITOT  --  0.9  PROT  --  4.6*   ALBUMIN 2.6* 1.7*   No results for input(s): LIPASE, AMYLASE in the last 168 hours. No results for input(s): AMMONIA in the last 168 hours. Coagulation profile No results for input(s): INR, PROTIME in the last 168 hours. COVID-19 Labs  No results for input(s): DDIMER, FERRITIN, LDH, CRP in the last 72 hours.  Lab Results  Component Value Date   SARSCOV2NAA NEGATIVE 03/20/2020   SARSCOV2NAA NEGATIVE 02/29/2020   [place] NEGATIVE 02/25/2020   [place] NEGATIVE 02/15/2020    CBC: Recent Labs  Lab 03/02/20 1005 03/25/2020 0929 03/23/2020 1343 03/08/20 0255  WBC 11.9* 11.5*  --  13.4*  NEUTROABS  --  9.8*  --  11.2*  HGB 7.5* 8.5* 7.1* 7.0*  HCT 23.2* 28.3* 21.0* 22.8*  MCV 93.9 100.0  --  100.4*  PLT 87* 90*  --  64*   Cardiac Enzymes: No results for input(s): CKTOTAL, CKMB, CKMBINDEX, TROPONINI in the last 168 hours. BNP (last 3 results) No results for input(s): PROBNP in the last 8760 hours. CBG: No results for input(s): GLUCAP in the last 168 hours. D-Dimer: No results for input(s): DDIMER in the last 72 hours. Hgb A1c: No results for input(s): HGBA1C in the last 72 hours. Lipid Profile: No results for input(s): CHOL, HDL, LDLCALC, TRIG, CHOLHDL, LDLDIRECT in the last 72 hours. Thyroid function studies: No results for input(s): TSH, T4TOTAL, T3FREE, THYROIDAB in the last 72 hours.  Invalid input(s): FREET3 Anemia work up: No results for input(s): VITAMINB12, FOLATE, FERRITIN, TIBC, IRON, RETICCTPCT in the last 72 hours. Sepsis Labs: Recent Labs  Lab 03/02/20 1005 02/25/2020 0929 03/08/20 0255  WBC 11.9* 11.5* 13.4*   Microbiology Recent Results (from the past 240 hour(s))  Respiratory Panel by RT PCR (Flu A&B, Covid) - Nasopharyngeal Swab     Status: None   Collection Time: 02/29/20  4:19 PM   Specimen: Nasopharyngeal Swab  Result Value Ref Range Status   SARS Coronavirus 2 by RT PCR NEGATIVE NEGATIVE Final    Comment: (NOTE) SARS-CoV-2 target  nucleic acids are NOT DETECTED.  The SARS-CoV-2 RNA is generally detectable in upper respiratoy specimens during the acute phase of infection. The lowest concentration of SARS-CoV-2 viral copies this assay can detect is 131 copies/mL. A negative result does not preclude SARS-Cov-2 infection and should not be used as the sole basis for treatment or other patient management decisions. A negative result may occur with  improper specimen collection/handling, submission of specimen other than nasopharyngeal swab, presence of viral mutation(s) within the areas targeted by this assay, and inadequate number of viral copies (<131 copies/mL). A negative result must be combined with clinical observations, patient history, and epidemiological information. The expected result is Negative.  Fact Sheet for Patients:  PinkCheek.be  Fact Sheet for Healthcare Providers:  GravelBags.it  This test is no t yet approved or cleared by the [place] FDA and  has been authorized for detection and/or diagnosis of SARS-CoV-2 by FDA under an Emergency Use Authorization (EUA). This EUA will remain  in effect (meaning this test can be used) for the duration of the COVID-19 declaration under Section 564(b)(1) of the Act, 21 U.S.C. section 360bbb-3(b)(1), unless the authorization is terminated or revoked sooner.     Influenza A by PCR NEGATIVE NEGATIVE Final   Influenza B by PCR NEGATIVE NEGATIVE Final    Comment: (NOTE) The Xpert Xpress SARS-CoV-2/FLU/RSV assay is intended as an aid in  the diagnosis of influenza from Nasopharyngeal swab specimens and  should  not be used as a sole basis for treatment. Nasal washings and  aspirates are unacceptable for Xpert Xpress SARS-CoV-2/FLU/RSV  testing.  Fact Sheet for Patients: PinkCheek.be  Fact Sheet for Healthcare  Providers: GravelBags.it  This test is not yet approved or cleared by the [place] FDA and  has been authorized for detection and/or diagnosis of SARS-CoV-2 by  FDA under an Emergency Use Authorization (EUA). This EUA will remain  in effect (meaning this test can be used) for the duration of the  Covid-19 declaration under Section 564(b)(1) of the Act, 21  U.S.C. section 360bbb-3(b)(1), unless the authorization is  terminated or revoked. Performed at [facility], [place] [address] [place], [place] [address]   Respiratory Panel by RT PCR (Flu A&B, Covid) - Nasopharyngeal Swab     Status: None   Collection Time: 03/10/2020  2:14 PM   Specimen: Nasopharyngeal Swab  Result Value Ref Range Status   SARS Coronavirus 2 by RT PCR NEGATIVE NEGATIVE Final    Comment: (NOTE) SARS-CoV-2 target nucleic acids are NOT DETECTED.  The SARS-CoV-2 RNA is generally detectable in upper respiratoy specimens during the acute phase of infection. The lowest concentration of SARS-CoV-2 viral copies this assay can detect is 131 copies/mL. A negative result does not preclude SARS-Cov-2 infection and should not be used as the sole basis for treatment or other patient management decisions. A negative result may occur with  improper specimen collection/handling, submission of specimen other than nasopharyngeal swab, presence of viral mutation(s) within the areas targeted by this assay, and inadequate number of viral copies (<131 copies/mL). A negative result must be combined with clinical observations, patient history, and epidemiological information. The expected result is Negative.  Fact Sheet for Patients:  PinkCheek.be  Fact Sheet for Healthcare Providers:  GravelBags.it  This test is no t yet approved or cleared by the [place] FDA and  has been authorized for detection and/or diagnosis of SARS-CoV-2  by FDA under an Emergency Use Authorization (EUA). This EUA will remain  in effect (meaning this test can be used) for the duration of the COVID-19 declaration under Section 564(b)(1) of the Act, 21 U.S.C. section 360bbb-3(b)(1), unless the authorization is terminated or revoked sooner.     Influenza A by PCR NEGATIVE NEGATIVE Final   Influenza B by PCR NEGATIVE NEGATIVE Final    Comment: (NOTE) The Xpert Xpress SARS-CoV-2/FLU/RSV assay is intended as an aid in  the diagnosis of influenza from Nasopharyngeal swab specimens and  should not be used as a sole basis for treatment. Nasal washings and  aspirates are unacceptable for Xpert Xpress SARS-CoV-2/FLU/RSV  testing.  Fact Sheet for Patients: PinkCheek.be  Fact Sheet for Healthcare Providers: GravelBags.it  This test is not yet approved or cleared by the [place] FDA and  has been authorized for detection and/or diagnosis of SARS-CoV-2 by  FDA under an Emergency Use Authorization (EUA). This EUA will remain  in effect (meaning this test can be used) for the duration of the  Covid-19 declaration under Section 564(b)(1) of the Act, 21  U.S.C. section 360bbb-3(b)(1), unless the authorization is  terminated or revoked. Performed at [facility], [place] [address], [place], [place] [address]      Medications:   . apixaban  2.5 mg Oral BID  . [START ON 03/09/2020] calcitRIOL  0.25 mcg Oral Q M,W,F-HD  . Chlorhexidine Gluconate Cloth  6 each Topical Q0600  . dexamethasone  4 mg Oral TID  . fluticasone  furoate-vilanterol  1 puff Inhalation Daily   And  . umeclidinium bromide  1 puff Inhalation Daily  . levothyroxine  125 mcg Oral QAC breakfast  . midodrine  10 mg Oral TID WC  . pantoprazole  40 mg Oral BID  . vancomycin variable dose per unstable renal function (pharmacist dosing)   Does not apply See admin instructions   Continuous Infusions: . ceFEPime  (MAXIPIME) IV    . diltiazem (CARDIZEM) infusion 5 mg/hr (02/28/2020 1957)      LOS: 1 day   Charlynne Cousins  [facility]  03/08/2020, 8:32 AM

## 2020-03-08 NOTE — Consult Note (Signed)
Referring Provider: No ref. provider found Primary Care Physician:  [name], MD Primary Nephrologist:  Dr. Hollie Salk  Reason for Consultation: Medical management end-stage renal disease, assessment and treatment of anemia, maintenance of euvolemia, assessment treatment secondary hyperparathyroidism.  HPI: This is a 74 year old lady with history end-stage renal disease Monday Wednesday Friday dialysis at [facility].  She presents the emergency room 02/28/2020 with pneumonia.  She has a significant past medical history for Crohn's disease COPD and chronic chronic oxygen therapy.  She has a history of hypertension atrial fibrillation is currently on Eliquis.  She had a recently diagnosed lung mass malignancy in June 2021.  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  She was recently admitted 02/28/2020-03/03/2020 showed left-sided pneumothorax and underwent left-sided chest tube placement discharged home in stable condition.  She follows up with home hospice but does wish to continue dialysis at this present time.  Blood pressure 103/61 pulse 96 temperature 97.3 O2 sats 97% 5 L nasal cannula  Sodium 141 potassium 5 chloride 104 CO2 18 BUN 86 creatinine 7.95 glucose 64 calcium 7.6 albumin 1.7 phosphorus 5.3.  Hemoglobin 7  Eliquis 2.5 mg twice daily Calcitrol 0.25 mcg Monday Wednesday Friday dexamethasone 4 mg 3 times daily, levothyroxine 125 mcg daily, midodrine 10 mg 3 times daily, Protonix 40 mg daily  IV cefepime IV vancomycin  Chest CT 03/10/2020 2.9 cm left upper lobe mass.  [facility] Monday Wednesday Friday  dialysis prescription 3 times weekly dialyzer 180 NRe OPTi flux Blood flow rate 300 DFR 800 Time 4 hours   3K     2.25 Ca EDW 51 kg IV Hectorol 3 mcg 3 times weekly Left AVG   Past Medical History:  Diagnosis Date  . Anal fistula   . Anemia in chronic kidney disease (CKD)   . Anxiety   . Aortic insufficiency     Echo 3/18: mod conc LVH, EF 60-65, no RWMA, Gr 1 DD, mod AI, mild MR, normal RVSF, Trivial TR  . Arthritis   . Borderline hypertension   . Bulging disc    L3-L4  . Cancer ([place])    Brain and spinal cord mets.  . CHF (congestive heart failure) ([place])   . Chronic diarrhea    due to crohn's  . CKD (chronic kidney disease), stage IV (HCC)    MWF- [place] `  . Crohn's disease ([place])    chronic ileitis  . Dyspnea    with exertion  . Emphysema/COPD ([place])   . GERD (gastroesophageal reflux disease)    denies  . History of blood transfusion   . History of glaucoma   . History of kidney stones   . History of pancreatitis    2008--  mercaptopurine  . History of small bowel obstruction    12-03-2010  due to crohn's ileitis  . Hypertension    no longer on medications  . Hypothyroidism, postsurgical    multinodule w/ hurthle cells  . Osteoporosis   . PAF (paroxysmal atrial fibrillation) ([place])   . Perianal Crohn's disease ([place])   . Peripheral vascular disease ([place])    blood clot behind knee left leg  . Polyarthralgia   . Pulmonary embolism ([place])   . Seizures ([place])    03/2017  . Wears partial dentures    upper    Past Surgical History:  Procedure Laterality Date  . ABDOMINAL HYSTERECTOMY  1990   and  Appendectomy  . AV FISTULA PLACEMENT Left  12/08/2017   Procedure: INSERTION OF ARTERIOVENOUS (AV) GRAFT WITH ARTEGRAFT TO LEFT UPPER ARM;  Surgeon: Conrad [place], MD;  Location: [place];  Service: Vascular;  Laterality: Left;  . BASCILIC VEIN TRANSPOSITION Left 01/09/2017   Procedure: LEFT 1ST STAGE BRACHIAL VEIN TRANSPOSITION;  Surgeon: Conrad Real, MD;  Location: [place];  Service: Vascular;  Laterality: Left;  . BASCILIC VEIN TRANSPOSITION Left 06/16/2017   Procedure: Second Stage BASILIC VEIN TRANSPOSITION  LEFT ARM;  Surgeon: Conrad Loma, MD;  Location: [place];  Service: Vascular;  Laterality: Left;  . BIOPSY  03/09/2018   Procedure: BIOPSY;  Surgeon: Wilford Corner, MD;   Location: [facility];  Service: Endoscopy;;  . BIOPSY  01/31/2020   Procedure: BIOPSY;  Surgeon: Otis Brace, MD;  Location: [place];  Service: Gastroenterology;;  . BIOPSY  02/02/2020   Procedure: BIOPSY;  Surgeon: Otis Brace, MD;  Location: [place];  Service: Gastroenterology;;  . BRONCHIAL BIOPSY  02/28/2020   Procedure: BRONCHIAL BIOPSIES;  Surgeon: Garner Nash, DO;  Location: [facility];  Service: Pulmonary;;  . BRONCHIAL BRUSHINGS  02/28/2020   Procedure: BRONCHIAL BRUSHINGS;  Surgeon: Garner Nash, DO;  Location: [place] [facility];  Service: Pulmonary;;  . BRONCHIAL NEEDLE ASPIRATION BIOPSY  02/28/2020   Procedure: BRONCHIAL NEEDLE ASPIRATION BIOPSIES;  Surgeon: Garner Nash, DO;  Location: [place] [facility];  Service: Pulmonary;;  . BRONCHIAL WASHINGS  02/28/2020   Procedure: BRONCHIAL WASHINGS;  Surgeon: Garner Nash, DO;  Location: [place] [facility];  Service: Pulmonary;;  . CHOLECYSTECTOMY    . COLON RESECTION  x3 --  1978,  1987, 1989   ILEAL RESECTION x2/   [place]  . COLONOSCOPY WITH PROPOFOL N/A 09/25/2014   Procedure: COLONOSCOPY WITH PROPOFOL;  Surgeon: Garlan Fair, MD;  [facility];  Service: Endoscopy;  Laterality: N/A;  . COLONOSCOPY WITH PROPOFOL N/A 03/09/2018   Procedure: COLONOSCOPY WITH PROPOFOL;  Surgeon: Wilford Corner, MD;  [facility];  Service: Endoscopy;  Laterality: N/A;  . COLONOSCOPY WITH PROPOFOL N/A 02/02/2020   Procedure: COLONOSCOPY WITH PROPOFOL;  Surgeon: Otis Brace, MD;  Location: [place];  Service: Gastroenterology;  Laterality: N/A;  . CYSTOSCOPY W/ URETERAL STENT PLACEMENT Right 11/19/2016   Procedure: CYSTOSCOPY WITH RIGHT RETROGRADE PYELOGRAM/ URETEROSCOPY WITH LASER AND RIGHT URETERAL STENT PLACEMENT;  Surgeon: Ardis Hughs, MD;  [facility];  Service: Urology;  Laterality: Right;  . ESOPHAGOGASTRODUODENOSCOPY  03/04/2012   Procedure: ESOPHAGOGASTRODUODENOSCOPY  (EGD);  Surgeon: Garlan Fair, MD;  [facility];  Service: Endoscopy;  Laterality: N/A;  . ESOPHAGOGASTRODUODENOSCOPY (EGD) WITH PROPOFOL N/A 01/31/2020   Procedure: ESOPHAGOGASTRODUODENOSCOPY (EGD) WITH PROPOFOL;  Surgeon: Otis Brace, MD;  [facility];  Service: Gastroenterology;  Laterality: N/A;  . EXAMINATION UNDER ANESTHESIA N/A 09/12/2014   Procedure: EXAM UNDER ANESTHESIA;  Surgeon: Rolm Bookbinder, MD;  Location: [place];  Service: General;  Laterality: N/A;  . EYE SURGERY     laser  . FLEXIBLE SIGMOIDOSCOPY  03/04/2012   Procedure: FLEXIBLE SIGMOIDOSCOPY;  Surgeon: Garlan Fair, MD;  [facility];  Service: Endoscopy;  Laterality: N/A;  . GLAUCOMA SURGERY Bilateral   . HOLMIUM LASER APPLICATION Right 02/09/9149   Procedure: HOLMIUM LASER APPLICATION;  Surgeon: Ardis Hughs, MD;  [facility];  Service: Urology;  Laterality: Right;  . INCISION AND DRAINAGE PERIRECTAL ABSCESS N/A 09/12/2014   Procedure: IRRIGATION AND DEBRIDEMENT PERIRECTAL ABSCESS;  Surgeon: Rolm Bookbinder, MD;  Location: [place];  Service: General;  Laterality: N/A;  . IR FLUORO GUIDE CV LINE RIGHT  03/20/2017  . IR US GUIDE VASC ACCESS RIGHT  03/20/2017  . NEGATIVE SLEEP STUDY  2008  . PLACEMENT OF SETON N/A 12/01/2014   Procedure: PLACEMENT OF SETON;  Surgeon: [name], MD;  Location: [facility];  Service: General;  Laterality: N/A;  . POLYPECTOMY  03/09/2018   Procedure: POLYPECTOMY;  Surgeon: Wilford Corner, MD;  [facility];  Service: Endoscopy;;  . POLYPECTOMY  02/02/2020   Procedure: POLYPECTOMY;  Surgeon: Otis Brace, MD;  Location: [place] [facility];  Service: Gastroenterology;;  . RECTAL EXAM UNDER ANESTHESIA N/A 12/01/2014   Procedure: RECTAL EXAM UNDER ANESTHESIA;  Surgeon: [name], MD;  Location: [facility];  Service: General;  Laterality: N/A;  . TOTAL THYROIDECTOMY  10-13-2003  . TRANSTHORACIC  ECHOCARDIOGRAM  07-11-2013   mild LVH,  ef 55%,  moderate AR,  mild MR and TR,  trivial PR  . TUBAL LIGATION    . VIDEO BRONCHOSCOPY WITH ENDOBRONCHIAL NAVIGATION N/A 02/28/2020   Procedure: VIDEO BRONCHOSCOPY WITH ENDOBRONCHIAL NAVIGATION;  Surgeon: Garner Nash, DO;  Location: [place];  Service: Pulmonary;  Laterality: N/A;    Prior to Admission medications   Medication Sig Start Date End Date Taking? Authorizing Provider  acetaminophen (TYLENOL) 500 MG tablet Take 1,000 mg by mouth every 6 (six) hours as needed for mild pain.    Yes [provider]  Adalimumab (HUMIRA) 40 MG/0.8ML PSKT Inject 40 mg into the skin every 14 (fourteen) days.    Yes [provider]  albuterol (VENTOLIN HFA) 108 (90 Base) MCG/ACT inhaler INHALE 2 PUFFS BY MOUTH EVERY 6 HOURS IF NEEDED FOR WHEEZING OR SHORTNESS OF BREATH Patient taking differently: Inhale 2 puffs into the lungs every 6 (six) hours as needed for wheezing or shortness of breath. INHALE 2 PUFFS BY MOUTH EVERY 6 HOURS IF NEEDED FOR WHEEZING OR SHORTNESS OF BREATH 05/31/19  Yes Chesley Mires, MD  calcitRIOL (ROCALTROL) 0.25 MCG capsule Take 1 capsule (0.25 mcg total) by mouth every Monday, Wednesday, and Friday with hemodialysis. 02/03/20  Yes Regalado, Belkys A, MD  calcium acetate (PHOSLO) 667 MG capsule Take 1 capsule (667 mg total) by mouth 3 (three) times daily with meals. 02/03/20  Yes Regalado, Belkys A, MD  calcium-vitamin D (OSCAL WITH D) 500-200 MG-UNIT TABS tablet Take 1 tablet by mouth as needed (with dialysis). 02/03/20  Yes Regalado, Belkys A, MD  cyanocobalamin (,VITAMIN B-12,) 1000 MCG/ML injection Inject 1,000 mcg into the muscle every 30 (thirty) days.    Yes [provider]  dexamethasone (DECADRON) 4 MG tablet Take 1 tablet (4 mg total) by mouth 3 (three) times daily. 02/21/20  Yes Regalado, Belkys A, MD  ELIQUIS 5 MG TABS tablet Take 2.5 mg by mouth 2 (two) times daily. Takes 1/2 tablet (2.41m) bid 08/23/18   Yes [provider]  fluticasone (FLONASE) 50 MCG/ACT nasal spray Place 1 spray into both nostrils daily as needed for allergies.  03/19/19  Yes [provider]  Fluticasone-Umeclidin-Vilant (TRELEGY ELLIPTA) 100-62.5-25 MCG/INH AEPB Inhale 1 puff into the lungs daily. 01/17/20  Yes WMartyn Ehrich NP  ipratropium-albuterol (DUONEB) 0.5-2.5 (3) MG/3ML SOLN Take 3 mLs by nebulization every 6 (six) hours as needed. Patient taking differently: Take 3 mLs by nebulization every 6 (six) hours as needed (sob/wheezing).  12/30/18  Yes NFenton Foy NP  levothyroxine (SYNTHROID, LEVOTHROID) 125 MCG tablet Take 125 mcg by mouth daily before breakfast.  Yes [provider]  lidocaine-prilocaine (EMLA) cream Apply 1 application topically every Monday, Wednesday, and Friday.  11/29/18  Yes [provider]  methocarbamol (ROBAXIN) 750 MG tablet Take 1,500 mg by mouth every 8 (eight) hours as needed for muscle spasms. 02/14/20  Yes [provider]  midodrine (PROAMATINE) 10 MG tablet Take 1 tablet (10 mg total) by mouth 3 (three) times daily with meals. 01/24/20  Yes Nahser, Wonda Cheng, MD  nitroGLYCERIN (NITROSTAT) 0.4 MG SL tablet Place 1 tablet (0.4 mg total) under the tongue every 5 (five) minutes as needed for chest pain. 05/03/19 03/12/2020 Yes Nahser, Wonda Cheng, MD  ondansetron (ZOFRAN ODT) 4 MG disintegrating tablet Take 1 tablet (4 mg total) by mouth every 8 (eight) hours as needed for nausea or vomiting. 02/03/20  Yes Regalado, Belkys A, MD  oxyCODONE (OXY IR/ROXICODONE) 5 MG immediate release tablet Take 1 tablet (5 mg total) by mouth every 4 (four) hours as needed for severe pain. 03/03/20  Yes Arrien, Jimmy Picket, MD  OXYGEN Inhale 5 L into the lungs See admin instructions. Use every night and during the day as needed for shortness of breath    Yes [provider]  pantoprazole (PROTONIX) 40 MG tablet Take 1 tablet (40 mg total) by mouth 2 (two) times  daily. 02/03/20 02/25/2020 Yes Regalado, Belkys A, MD  sorbitol 70 % solution Take 15 mLs by mouth daily as needed. Patient taking differently: Take 15 mLs by mouth daily as needed (constipation).  02/21/20  Yes Regalado, Belkys A, MD  LORazepam (ATIVAN) 0.5 MG tablet Take 2 tablets (1 mg total) by mouth as needed for anxiety (30 minutes prior to Radiation treatment(s)). 02/23/20   Bruning, Ashlyn, PA-C  metoprolol tartrate (LOPRESSOR) 25 MG tablet Take 25 mg by mouth 2 (two) times daily. 02/10/20   [provider]  Respiratory Therapy Supplies (NEBULIZER) DEVI 1 Device by Does not apply route as needed. 12/07/18   Fenton Foy, NP  sorbitol 70 % SOLN Take 30 mLs by mouth daily as needed for moderate constipation. Patient not taking: Reported on 03/16/2020 02/21/20   Elmarie Shiley, MD    Current Facility-Administered Medications  Medication Dose Route Frequency Provider Last Rate Last Admin  . acetaminophen (TYLENOL) suppository 650 mg  650 mg Rectal Q4H PRN Pahwani, Rinka R, MD   650 mg at 03/08/2020 1902  . albuterol (VENTOLIN HFA) 108 (90 Base) MCG/ACT inhaler 2 puff  2 puff Inhalation Q6H PRN Pahwani, Rinka R, MD      . apixaban (ELIQUIS) tablet 2.5 mg  2.5 mg Oral BID Pahwani, Rinka R, MD   2.5 mg at 03/08/20 0020  . [START ON 03/09/2020] calcitRIOL (ROCALTROL) capsule 0.25 mcg  0.25 mcg Oral Q M,W,F-HD Pahwani, Rinka R, MD      . ceFEPIme (MAXIPIME) 1 g in sodium chloride 0.9 % 100 mL IVPB  1 g Intravenous Q24H [facility] I, RPH      . dexamethasone (DECADRON) tablet 4 mg  4 mg Oral TID Pahwani, Rinka R, MD   4 mg at 03/08/20 0024  . diltiazem (CARDIZEM) 125 mg in dextrose 5% 125 mL (1 mg/mL) infusion  5-15 mg/hr Intravenous Continuous Pahwani, Rinka R, MD 5 mL/hr at 03/02/2020 1957 5 mg/hr at 02/25/2020 1957  . fluticasone furoate-vilanterol (BREO ELLIPTA) 100-25 MCG/INH 1 puff  1 puff Inhalation Daily [facility] I, RPH       And  . umeclidinium bromide (INCRUSE  ELLIPTA) 62.5 MCG/INH 1  puff  1 puff Inhalation Daily [facility] I, [place]      . HYDROmorphone (DILAUDID) injection 0.5 mg  0.5 mg Intravenous Q4H PRN Pahwani, Rinka R, MD   0.5 mg at 03/08/20 0523  . ipratropium-albuterol (DUONEB) 0.5-2.5 (3) MG/3ML nebulizer solution 3 mL  3 mL Nebulization Q6H PRN Pahwani, Rinka R, MD      . levothyroxine (SYNTHROID) tablet 125 mcg  125 mcg Oral QAC breakfast Pahwani, Rinka R, MD   125 mcg at 03/08/20 0522  . LORazepam (ATIVAN) tablet 1 mg  1 mg Oral PRN Pahwani, Rinka R, MD      . methocarbamol (ROBAXIN) tablet 1,500 mg  1,500 mg Oral Q8H PRN Pahwani, Rinka R, MD      . midodrine (PROAMATINE) tablet 10 mg  10 mg Oral TID WC Pahwani, Rinka R, MD   10 mg at 03/15/2020 1706  . nitroGLYCERIN (NITROSTAT) SL tablet 0.4 mg  0.4 mg Sublingual Q5 min PRN Pahwani, Rinka R, MD      . pantoprazole (PROTONIX) EC tablet 40 mg  40 mg Oral BID Pahwani, Rinka R, MD   40 mg at 03/08/20 0020  . vancomycin variable dose per unstable renal function (pharmacist dosing)   Does not apply See admin instructions [facility] I, [facility]        Allergies as of 03/12/2020 - Review Complete 03/06/2020  Allergen Reaction Noted  . Mercaptopurine Other (See Comments) 03/04/2012  . Remicade [infliximab] Other (See Comments) 03/04/2012    Family History  Problem Relation Age of Onset  . Hypertension Father   . Heart disease Father   . Heart attack Father   . Diverticulitis Mother   . COPD Mother   . Hypertension Mother   . Heart disease Mother   . Heart attack Mother   . Heart attack Brother   . Colon cancer Brother   . Diabetes Brother   . Thyroid disease Brother     Social History   Socioeconomic History  . Marital status: Married    Spouse name: [name]  . Number of children: 3  . Years of education: 1  . Highest education level: Not on file  Occupational History  . Occupation: Retired    Fish farm manager: RETIRED  Tobacco Use  . Smoking status: Former Smoker     Packs/day: 1.00    Years: 35.00    Pack years: 35.00    Types: Cigarettes    Start date: 02/22/1967    Quit date: 11/27/2017    Years since quitting: 2.2  . Smokeless tobacco: Never Used  Vaping Use  . Vaping Use: Never used  Substance and Sexual Activity  . Alcohol use: No  . Drug use: No  . Sexual activity: Never  Other Topics Concern  . Not on file  Social History Narrative   Patient is married [name]) and lives at home with her husband.   Patient has three children.   Patient is retired on disability.   Patient drinks two cups of caffeine daily.   Patient is right-handed.         Social Determinants of Health   Financial Resource Strain:   . Difficulty of Paying Living Expenses: Not on file  Food Insecurity:   . Worried About Charity fundraiser in the Last Year: Not on file  . Ran Out of Food in the Last Year: Not on file  Transportation Needs:   . Lack of Transportation (Medical): Not on file  . Lack  of Transportation (Non-Medical): Not on file  Physical Activity:   . Days of Exercise per Week: Not on file  . Minutes of Exercise per Session: Not on file  Stress:   . Feeling of Stress : Not on file  Social Connections:   . Frequency of Communication with Friends and Family: Not on file  . Frequency of Social Gatherings with Friends and Family: Not on file  . Attends Religious Services: Not on file  . Active Member of Clubs or Organizations: Not on file  . Attends Archivist Meetings: Not on file  . Marital Status: Not on file  Intimate Partner Violence:   . Fear of Current or Ex-Partner: Not on file  . Emotionally Abused: Not on file  . Physically Abused: Not on file  . Sexually Abused: Not on file    Review of Systems: Gen: Weakness and fatigue   HEENT: No visual complaints, No history of Retinopathy. Normal external appearance No Epistaxis or Sore throat. No sinusitis.   CV: Denies chest pain, angina, palpitations, syncope, orthopnea, PND,  peripheral edema, and claudication. Resp: Cough exertional dyspnea and pleuritic chest pain.  History of lung cancer with 2.9 cm left upper lobe mass GI: Denies vomiting blood, jaundice, and fecal incontinence.   Denies dysphagia or odynophagia. GU : End-stage renal disease MS: Denies joint pain, limitation of movement, and swelling, stiffness, low back pain, extremity pain. Denies muscle weakness, cramps, atrophy.  No use of non steroidal antiinflammatory drugs. Derm: Denies rash, itching, dry skin, hives, moles, warts, or unhealing ulcers.  Psych: Denies depression, anxiety, memory loss, suicidal ideation, hallucinations, paranoia, and confusion. Heme: Denies bruising, bleeding, and enlarged lymph nodes. Neuro: Brain mets from primary lung cancer diagnosed July 2021 Endocrine No DM.  Hypothyroidism replacement.  No Adrenal disease.  Physical Exam: Vital signs in last 24 hours: Temp:  [97.3 F (36.3 C)-102.3 F (39.1 C)] 97.3 F (36.3 C) (10/14 0346) Pulse Rate:  [99-146] 99 (10/14 0346) Resp:  [17-31] 18 (10/14 0300) BP: (91-131)/(55-82) 99/64 (10/14 0400) SpO2:  [90 %-100 %] 97 % (10/14 0012) Weight:  [53.1 kg] 53.1 kg (10/14 0259) Last BM Date: 02/24/2020 General: Chronically ill-appearing lady some respiratory difficulty Head:  Normocephalic and atraumatic. Eyes:  Sclera clear, no icterus.   Conjunctiva pink. Ears:  Normal auditory acuity. Nose:  No deformity, discharge,  or lesions.  [place] ing oxygen Mouth:  No deformity or lesions, dentition normal. Neck:  Supple; no masses or thyromegaly. JVP not elevated Lungs: Tachypnea with bilateral coarse breath sounds. Heart:  Regular rate and rhythm; no murmurs, clicks, rubs,  or gallops. Abdomen:  Soft, nontender and nondistended. No masses, hepatosplenomegaly or hernias noted. Normal bowel sounds, without guarding, and without rebound.   Msk:  Symmetrical without gross deformities. Normal posture. Pulses:  No carotid, renal, femoral  bruits. DP and PT symmetrical and equal Extremities: Lower extremity edema   left arm graft Neurologic:  Alert and  oriented x4;  grossly normal neurologically. Skin:  Intact without significant lesions or rashes.   Intake/Output from previous day: 10/13 0701 - 10/14 0700 In: 250.5 [P.O.:240; I.V.:10.5] Out: -  Intake/Output this shift: Total I/O In: 250.5 [P.O.:240; I.V.:10.5] Out: -   Lab Results: Recent Labs    03/18/2020 0929 03/18/2020 1343 03/08/20 0255  WBC 11.5*  --  13.4*  HGB 8.5* 7.1* 7.0*  HCT 28.3* 21.0* 22.8*  PLT 90*  --  64*   BMET Recent Labs    03/06/2020 0929 03/12/2020  1339 02/27/2020 1343 03/08/20 0255  NA 140  --  138 141  K 4.1  --  4.0 5.0  CL 100  --   --  104  CO2 22  --   --  18*  GLUCOSE 89  --   --  64*  BUN 71*  --   --  86*  CREATININE 7.03*  --   --  7.95*  CALCIUM 8.2*  --   --  7.6*  PHOS  --  5.3*  --   --    LFT Recent Labs    03/08/20 0255  PROT 4.6*  ALBUMIN 1.7*  AST 27  ALT 20  ALKPHOS 62  BILITOT 0.9   PT/INR No results for input(s): LABPROT, INR in the last 72 hours. Hepatitis Panel No results for input(s): HEPBSAG, HCVAB, HEPAIGM, HEPBIGM in the last 72 hours.  Studies/Results: CT Angio Chest PE W/Cm &/Or Wo Cm  Result Date: 02/26/2020 CLINICAL DATA:  Shortness of breath. EXAM: CT ANGIOGRAPHY CHEST WITH CONTRAST TECHNIQUE: Multidetector CT imaging of the chest was performed using the standard protocol during bolus administration of intravenous contrast. Multiplanar CT image reconstructions and MIPs were obtained to evaluate the vascular anatomy. CONTRAST:  1m OMNIPAQUE IOHEXOL 350 MG/ML SOLN COMPARISON:  February 29, 2020. FINDINGS: Cardiovascular: Satisfactory opacification of the pulmonary arteries to the segmental level. No evidence of pulmonary embolism. Normal heart size. No pericardial effusion. Atherosclerosis of thoracic aorta is noted without aneurysm or dissection. Mediastinum/Nodes: Status post thyroidectomy.  Esophagus is unremarkable. No adenopathy is noted. Lungs/Pleura: No pneumothorax or pleural effusion is noted. Bilateral lower lobe and right upper lobe opacities are noted concerning for multifocal pneumonia. 2.9 cm left upper lobe mass is again noted concerning for malignancy. Upper Abdomen: No acute abnormality. Musculoskeletal: No osseous abnormality is noted. Large amount of subcutaneous emphysema is seen over the left chest wall and supraclavicular region. Review of the MIP images confirms the above findings. IMPRESSION: 1. No definite evidence of pulmonary embolus. 2. Bilateral lower lobe and right upper lobe opacities are noted concerning for multifocal pneumonia. 3. 2.9 cm left upper lobe mass is noted concerning for malignancy. 4. Large amount of subcutaneous emphysema is seen over the left chest wall and supraclavicular region. 5. Aortic atherosclerosis. Aortic Atherosclerosis (ICD10-I70.0). Electronically Signed   By: JMarijo ConceptionM.D.   On: 03/06/2020 12:52   DG Chest Portable 1 View  Result Date: 03/20/2020 CLINICAL DATA:  Chest pain, shortness of breath. EXAM: PORTABLE CHEST 1 VIEW COMPARISON:  March 03, 2020. FINDINGS: Stable cardiomediastinal silhouette. Minimal left apical pneumothorax is noted. Stable left perihilar mass is noted increased right basilar atelectasis or infiltrate is noted. Stable left basilar atelectasis or infiltrate is noted. Small pleural effusions may be present. Bony thorax is unremarkable. Subcutaneous emphysema is seen over the left lateral chest wall and left supraclavicular region. IMPRESSION: Minimal left apical pneumothorax is noted. Stable left perihilar mass. Increased right basilar atelectasis or infiltrate is noted. Stable left basilar atelectasis or infiltrate is noted. Small pleural effusions may be present. Electronically Signed   By: JMarijo ConceptionM.D.   On: 03/12/2020 09:31    Assessment/Plan:  [facility] 03/08/2020.  This is off schedule.  We will continue dialysis.    ANEMIA-recommend transfusion as needed.  Metastatic  cancer avoiding the use of ESA's  MBD-Hectorol IV as well as calcitriol.  I will avoid the use of IV  Hectorol at this present time his calcium corrected appears to be generous  HTN/VOL-we will continue dialysis for treatment of euvolemia.  ACCESS-left AV graft with thrill and bruit  Metastatic lung cancer.  Prognosis poor recommend palliative medicine.  Pneumonia as per primary service   LOS: [place] @TODAY @6 :38 AM

## 2020-03-08 NOTE — Progress Notes (Signed)
Pharmacy Antibiotic Note  Natasha [name] is a 74 y.o. female admitted on 03/11/2020 with pneumonia.  Pharmacy has been consulted for vancomycin and cefepime dosing. This is day 2 of therapy. Pt has been recently hospitalized and was discharged on 03/03/20 for L pneumothorax. Pt is febrile with Tm 102.3, WBC up 13.4. Pt has ESRD with HD MWF outpatient but will have off schedule today.   Plan: Vancomcyin 500 mg IV with HD today No standing vanc will f/u HD schedule inpatient Cefepime 1g IV q24h  Monitor clinical progress, micro data, HD schedule, and CBC  Height: 5' 4"  (162.6 cm) Weight: 53.1 kg (117 lb 1 oz) IBW/kg (Calculated) : 54.7  Temp (24hrs), Avg:98.4 F (36.9 C), Min:97.3 F (36.3 C), Max:102.3 F (39.1 C)  Recent Labs  Lab 03/02/20 0059 03/02/20 1005 03/14/2020 0929 03/08/20 0255  WBC  --  11.9* 11.5* 13.4*  CREATININE 4.98*  --  7.03* 7.95*    Estimated Creatinine Clearance: 5.3 mL/min (A) (by C-G formula based on SCr of 7.95 mg/dL (H)).    Allergies  Allergen Reactions  . Mercaptopurine Other (See Comments)    Caused pancreatitis  . Remicade [Infliximab] Other (See Comments)    CAUSED JOINT PAIN    Antimicrobials this admission: Cefepime 10/13> vanc 10/13>   Microbiology results: 10/13 MRSA pcr: ordered 10/13 resp panel: neg 10/13 BCx: pending   Thank you for involving pharmacy in this patient's care.  Renold Genta, PharmD, BCPS Clinical Pharmacist Clinical phone for 03/08/2020 until 3p is [phone] 03/08/2020 11:12 AM  **Pharmacist phone directory can be found on [email or web address] listed under [place]**

## 2020-03-08 NOTE — Progress Notes (Signed)
Pt returned to unit from HD. Pt sleeping in bed no signs of distress. Blood given in HD. Vanc was not. Pharmacy was contacted to send medication to [facility]. Waiting of bed it arrive

## 2020-03-09 ENCOUNTER — Encounter (HOSPITAL_COMMUNITY): Payer: Self-pay | Admitting: Internal Medicine

## 2020-03-09 DIAGNOSIS — Z7189 Other specified counseling: Secondary | ICD-10-CM | POA: Diagnosis not present

## 2020-03-09 DIAGNOSIS — Z66 Do not resuscitate: Secondary | ICD-10-CM | POA: Diagnosis not present

## 2020-03-09 DIAGNOSIS — C7802 Secondary malignant neoplasm of left lung: Secondary | ICD-10-CM | POA: Diagnosis not present

## 2020-03-09 DIAGNOSIS — Z515 Encounter for palliative care: Secondary | ICD-10-CM | POA: Diagnosis not present

## 2020-03-09 DIAGNOSIS — J189 Pneumonia, unspecified organism: Secondary | ICD-10-CM | POA: Diagnosis not present

## 2020-03-09 LAB — TYPE AND SCREEN
ABO/RH(D): O POS
Antibody Screen: NEGATIVE
Unit division: 0

## 2020-03-09 LAB — BPAM RBC
Blood Product Expiration Date: 202111182359
ISSUE DATE / TIME: 202110141400
Unit Type and Rh: 5100

## 2020-03-09 MED ORDER — SODIUM CHLORIDE 0.9 % IV SOLN
INTRAVENOUS | Status: DC | PRN
  Administered 2020-03-09 – 2020-03-11 (×3): 250 mL via INTRAVENOUS

## 2020-03-09 MED ORDER — VANCOMYCIN HCL 500 MG/100ML IV SOLN
500.0000 mg | Freq: Once | INTRAVENOUS | Status: AC
  Administered 2020-03-09: 500 mg via INTRAVENOUS
  Filled 2020-03-09: qty 100

## 2020-03-09 MED ORDER — HYDROMORPHONE HCL 1 MG/ML PO LIQD
1.0000 mg | ORAL | Status: DC | PRN
  Filled 2020-03-09: qty 1

## 2020-03-09 MED ORDER — MORPHINE SULFATE 10 MG/5ML PO SOLN: 2.5000 mg | ORAL | Status: DC | PRN

## 2020-03-09 NOTE — Progress Notes (Signed)
This chaplain responds to PMT-MYF referral for spiritual care.  The Pt. husband-[name] "Natasha [name]" is bedside.  The Pt. shares she is tired, uncomfortable and doesn't have an appetite.  Natasha [name] expresses his desire for the Pt. meals to be warmed when they are cold.   Natasha [name] steps out of the room. The Pt. updates the chaplain on the difficult task of ongoing dialysis.  The chaplain listens as the Pt. describes the challenge in finding the right time to talk to Natasha [name] about her quality of life.  The chaplain stepped out when Respiratory Therapy arrived.  The chaplain's personal prayers are accepted by the Pt. F/U spiritual care is available as needed.

## 2020-03-09 NOTE — Progress Notes (Addendum)
[facility] ROUNDING NOTE   Subjective:   Brief history: This is a 74 year old lady with history end-stage renal disease Monday Wednesday Friday dialysis at [facility] kidney [facility].  She presents the emergency room 03/21/2020 with pneumonia.  She has a significant past medical history for Crohn's disease COPD and chronic chronic oxygen therapy.  She has a history of hypertension atrial fibrillation is currently on Eliquis.  She had a recently diagnosed lung mass malignancy in June 2021.  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  She was recently admitted 02/28/2020-03/03/2020 showed left-sided pneumothorax and underwent left-sided chest tube placement discharged home in stable condition.  She follows up with home hospice but does wish to continue dialysis at this present time.  Underwent dialysis 03/08/2020 1.3 L removed  Blood pressure 122/62 pulse 84 temperature 98.6 O2 sats 97% on 5 L nasal cannula  Sodium 141 potassium 5 chloride 104 CO2 18 BUN 86 creatinine 7.95 glucose 64 calcium 7.6 albumin 1.7 phosphorus 5.3.  Hemoglobin 8.7   Eliquis 2.5 mg twice daily Calcitrol 0.25 mcg Monday Wednesday Friday dexamethasone 4 mg 3 times daily, levothyroxine 125 mcg daily, midodrine 10 mg 3 times daily, Protonix 40 mg daily  IV cefepime IV vancomycin  Chest CT 03/05/2020 2.9 cm left upper lobe mass.  [facility] Monday Wednesday Friday  dialysis prescription 3 times weekly dialyzer 180 NRe OPTi flux Blood flow rate 300 DFR 800 Time 4 hours   3[facility]     2.25 Ca EDW 51 kg IV Hectorol 3 mcg 3 times weekly Left AVG  Objective:  Vital signs in last 24 hours:  Temp:  [97.6 F (36.4 C)-98.9 F (37.2 C)] 98.6 F (37 C) (10/15 0400) Pulse Rate:  [69-102] 79 (10/15 0400) Resp:  [12-18] 13 (10/15 0400) BP: (85-130)/(50-67) 128/62 (10/15 0539) SpO2:  [93 %-100 %] 98 % (10/15 0400) Weight:  [51.4 kg-52.7 kg] 51.4 kg (10/15 0400)  Weight  change: -0.4 kg Filed Weights   03/08/20 0259 03/08/20 1340 03/09/20 0400  Weight: 53.1 kg 52.7 kg 51.4 kg    Intake/Output: I/O last 3 completed shifts: In: 665.5 [P.O.:240; I.V.:110.5; Blood:315] Out: 2536 [Other:1385]   Intake/Output this shift:  Total I/O In: -  Out: 1 [Stool:1]   Resting comfortably using oxygen CVS- RRR no murmurs rubs or gallops RS- CTA rhonchorous breath sounds some respiratory effort increase ABD- BS present soft non-distended EXT- no edema   Basic Metabolic Panel: Recent Labs  Lab 03/04/2020 0929 03/25/2020 1339 03/05/2020 1343 03/08/20 0255  NA 140  --  138 141  K 4.1  --  4.0 5.0  CL 100  --   --  104  CO2 22  --   --  18*  GLUCOSE 89  --   --  64*  BUN 71*  --   --  86*  CREATININE 7.03*  --   --  7.95*  CALCIUM 8.2*  --   --  7.6*  MG  --  1.5*  --   --   PHOS  --  5.3*  --   --     Liver Function Tests: Recent Labs  Lab 03/08/20 0255  AST 27  ALT 20  ALKPHOS 62  BILITOT 0.9  PROT 4.6*  ALBUMIN 1.7*   No results for input(s): LIPASE, AMYLASE in the last 168 hours. No results for input(s): AMMONIA in the last 168 hours.  CBC: Recent Labs  Lab 03/02/20 1005 03/22/2020  6203 03/08/2020 1343 03/08/20 0255 03/08/20 2015  WBC 11.9* 11.5*  --  13.4*  --   NEUTROABS  --  9.8*  --  11.2*  --   HGB 7.5* 8.5* 7.1* 7.0* 8.7*  HCT 23.2* 28.3* 21.0* 22.8* 27.0*  MCV 93.9 100.0  --  100.4*  --   PLT 87* 90*  --  64*  --     Cardiac Enzymes: No results for input(s): CKTOTAL, CKMB, CKMBINDEX, TROPONINI in the last 168 hours.  BNP: Invalid input(s): POCBNP  CBG: No results for input(s): GLUCAP in the last 168 hours.  Microbiology: Results for orders placed or performed during the hospital encounter of 03/08/2020  Respiratory Panel by RT PCR (Flu A&B, Covid) - Nasopharyngeal Swab     Status: None   Collection Time: 03/09/2020  2:14 PM   Specimen: Nasopharyngeal Swab  Result Value Ref Range Status   SARS Coronavirus 2 by RT PCR  NEGATIVE NEGATIVE Final    Comment: (NOTE) SARS-CoV-2 target nucleic acids are NOT DETECTED.  The SARS-CoV-2 RNA is generally detectable in upper respiratoy specimens during the acute phase of infection. The lowest concentration of SARS-CoV-2 viral copies this assay can detect is 131 copies/mL. A negative result does not preclude SARS-Cov-2 infection and should not be used as the sole basis for treatment or other patient management decisions. A negative result may occur with  improper specimen collection/handling, submission of specimen other than nasopharyngeal swab, presence of viral mutation(s) within the areas targeted by this assay, and inadequate number of viral copies (<131 copies/mL). A negative result must be combined with clinical observations, patient history, and epidemiological information. The expected result is Negative.  Fact Sheet for Patients:  PinkCheek.be  Fact Sheet for Healthcare Providers:  GravelBags.it  This test is no t yet approved or cleared by the [place] FDA and  has been authorized for detection and/or diagnosis of SARS-CoV-2 by FDA under an Emergency Use Authorization (EUA). This EUA will remain  in effect (meaning this test can be used) for the duration of the COVID-19 declaration under Section 564(b)(1) of the Act, 21 U.S.C. section 360bbb-3(b)(1), unless the authorization is terminated or revoked sooner.     Influenza A by PCR NEGATIVE NEGATIVE Final   Influenza B by PCR NEGATIVE NEGATIVE Final    Comment: (NOTE) The Xpert Xpress SARS-CoV-2/FLU/RSV assay is intended as an aid in  the diagnosis of influenza from Nasopharyngeal swab specimens and  should not be used as a sole basis for treatment. Nasal washings and  aspirates are unacceptable for Xpert Xpress SARS-CoV-2/FLU/RSV  testing.  Fact Sheet for Patients: PinkCheek.be  Fact Sheet for  Healthcare Providers: GravelBags.it  This test is not yet approved or cleared by the [place] FDA and  has been authorized for detection and/or diagnosis of SARS-CoV-2 by  FDA under an Emergency Use Authorization (EUA). This EUA will remain  in effect (meaning this test can be used) for the duration of the  Covid-19 declaration under Section 564(b)(1) of the Act, 21  U.S.C. section 360bbb-3(b)(1), unless the authorization is  terminated or revoked. Performed at [facility], [place] [address], [place], [place] [address]   Culture, blood (routine x 2) Call MD if unable to obtain prior to antibiotics being given     Status: None (Preliminary result)   Collection Time: 03/25/2020  6:27 PM   Specimen: BLOOD RIGHT FOREARM  Result Value Ref Range Status   Specimen Description BLOOD RIGHT FOREARM  Final  Special Requests   Final    BOTTLES DRAWN AEROBIC AND ANAEROBIC Blood Culture results may not be optimal due to an inadequate volume of blood received in culture bottles   Culture   Final    NO GROWTH < 24 HOURS Performed at [facility] [address], [place], [place] [address]    Report Status PENDING  Incomplete    Coagulation Studies: No results for input(s): LABPROT, INR in the last 72 hours.  Urinalysis: No results for input(s): COLORURINE, LABSPEC, PHURINE, GLUCOSEU, HGBUR, BILIRUBINUR, KETONESUR, PROTEINUR, UROBILINOGEN, NITRITE, LEUKOCYTESUR in the last 72 hours.  Invalid input(s): APPERANCEUR    Imaging: CT Angio Chest PE W/Cm &/Or Wo Cm  Result Date: 03/08/2020 CLINICAL DATA:  Shortness of breath. EXAM: CT ANGIOGRAPHY CHEST WITH CONTRAST TECHNIQUE: Multidetector CT imaging of the chest was performed using the standard protocol during bolus administration of intravenous contrast. Multiplanar CT image reconstructions and MIPs were obtained to evaluate the vascular anatomy. CONTRAST:  22m OMNIPAQUE IOHEXOL 350 MG/ML SOLN COMPARISON:   February 29, 2020. FINDINGS: Cardiovascular: Satisfactory opacification of the pulmonary arteries to the segmental level. No evidence of pulmonary embolism. Normal heart size. No pericardial effusion. Atherosclerosis of thoracic aorta is noted without aneurysm or dissection. Mediastinum/Nodes: Status post thyroidectomy. Esophagus is unremarkable. No adenopathy is noted. Lungs/Pleura: No pneumothorax or pleural effusion is noted. Bilateral lower lobe and right upper lobe opacities are noted concerning for multifocal pneumonia. 2.9 cm left upper lobe mass is again noted concerning for malignancy. Upper Abdomen: No acute abnormality. Musculoskeletal: No osseous abnormality is noted. Large amount of subcutaneous emphysema is seen over the left chest wall and supraclavicular region. Review of the MIP images confirms the above findings. IMPRESSION: 1. No definite evidence of pulmonary embolus. 2. Bilateral lower lobe and right upper lobe opacities are noted concerning for multifocal pneumonia. 3. 2.9 cm left upper lobe mass is noted concerning for malignancy. 4. Large amount of subcutaneous emphysema is seen over the left chest wall and supraclavicular region. 5. Aortic atherosclerosis. Aortic Atherosclerosis (ICD10-I70.0). Electronically Signed   By: JMarijo ConceptionM.D.   On: 03/21/2020 12:52   DG Chest Portable 1 View  Result Date: 02/28/2020 CLINICAL DATA:  Chest pain, shortness of breath. EXAM: PORTABLE CHEST 1 VIEW COMPARISON:  March 03, 2020. FINDINGS: Stable cardiomediastinal silhouette. Minimal left apical pneumothorax is noted. Stable left perihilar mass is noted increased right basilar atelectasis or infiltrate is noted. Stable left basilar atelectasis or infiltrate is noted. Small pleural effusions may be present. Bony thorax is unremarkable. Subcutaneous emphysema is seen over the left lateral chest wall and left supraclavicular region. IMPRESSION: Minimal left apical pneumothorax is noted. Stable left  perihilar mass. Increased right basilar atelectasis or infiltrate is noted. Stable left basilar atelectasis or infiltrate is noted. Small pleural effusions may be present. Electronically Signed   By: JMarijo ConceptionM.D.   On: 03/18/2020 09:31     Medications:   . ceFEPime (MAXIPIME) IV 1 g (03/08/20 2124)   . sodium chloride   Intravenous Once  . apixaban  2.5 mg Oral BID  . calcitRIOL  0.25 mcg Oral Q M,W,F-HD  . Chlorhexidine Gluconate Cloth  6 each Topical Q0600  . dexamethasone  4 mg Oral TID  . diltiazem  60 mg Oral Q6H  . fluticasone furoate-vilanterol  1 puff Inhalation Daily   And  . umeclidinium bromide  1 puff Inhalation Daily  . levothyroxine  125 mcg Oral QAC breakfast  .  midodrine  10 mg Oral TID WC  . pantoprazole  40 mg Oral BID  . vancomycin  500 mg Intravenous Once  . vancomycin variable dose per unstable renal function (pharmacist dosing)   Does not apply See admin instructions   acetaminophen, albuterol, HYDROmorphone (DILAUDID) injection, ipratropium-albuterol, LORazepam, methocarbamol, nitroGLYCERIN, traMADol  Assessment/ Plan:   ESRD-usually Monday Wednesday Friday [facility], underwent dialysis 03/08/2020 with removal of 1.6 L.  This is off schedule.  We will continue dialysis 03/10/2020  ANEMIA-recommend transfusion as needed.  Metastatic  cancer avoiding the use of ESA's  MBD-Hectorol IV as well as calcitriol.  I will avoid the use of IV Hectorol at this present time his calcium corrected appears to be generous  HTN/VOL-we will continue dialysis for treatment of euvolemia.  ACCESS-left AV graft with thrill and bruit  Metastatic lung cancer.  Prognosis poor recommend palliative medicine.  Pneumonia as per primary service    LOS: 2 Natasha [name] @TODAY @6 :42 AM

## 2020-03-09 NOTE — Progress Notes (Signed)
[employer] [facility]) Hospital Liaison note.    Received request from Sharyn Lull, NP with PMT, to meet with pt and husband to discuss what hospice support at home vs at inpt residential unit might look like. Visited Mrs. Gallery at bedside. She was resting comfortably and asked to delay conversation until tomorrow. [place] liaison to follow up as requested.  Please do not hesitate to call with questions. Thank you for the opportunity to participate in this patient's care.  Chrislyn Edison Pace, BSN, RN [place] (listed on [place] under Hospice/[facility])    [phone]

## 2020-03-09 NOTE — Progress Notes (Signed)
HEMATOLOGY-ONCOLOGY PROGRESS NOTE  SUBJECTIVE: Ms. Avellino is well-known to our practice.  We follow her for metastatic lung cancer.  She has had several hospitalizations over the past month and has not had an opportunity to follow-up with [place] in our office.  Currently admitted for bilateral pneumonia.  She reports her breathing is poor.  Feels as though she is very short of breath and gasping for air at times.  She reports that she is "tired" and expresses that she is sure she wants to pursue whole brain radiation or any form of systemic therapy.  Headaches controlled with dexamethasone 4 mg 3 times daily.  REVIEW OF SYSTEMS:   Constitutional: Denies fevers, chills Eyes: Denies blurriness of vision Ears, nose, mouth, throat, and face: Denies mucositis or sore throat Respiratory: Reports cough, shortness of breath, and gasping for air at times Cardiovascular: Denies palpitation, chest discomfort Gastrointestinal:  Denies nausea, heartburn or change in bowel habits Skin: Denies abnormal skin rashes Lymphatics: Denies new lymphadenopathy or easy bruising Neurological: Headaches controlled with dexamethasone and pain medication Behavioral/Psych: Mood is stable, no new changes  Extremities: No lower extremity edema All other systems were reviewed with the patient and are negative.  I have reviewed the past medical history, past surgical history, social history and family history with the patient and they are unchanged from previous note.   PHYSICAL EXAMINATION: ECOG PERFORMANCE STATUS: 3 - Symptomatic, >50% confined to bed  Vitals:   03/09/20 0700 03/09/20 1043  BP: 125/61   Pulse: 79   Resp: 15   Temp: 98.6 F (37 C)   SpO2: 96% 96%   Filed Weights   03/08/20 0259 03/08/20 1340 03/09/20 0400  Weight: 53.1 kg 52.7 kg 51.4 kg    Intake/Output from previous day: 10/14 0701 - 10/15 0700 In: 415 [I.V.:100; Blood:315] Out: 1386 [Stool:1]  GENERAL: Chronically ill-appearing, short of  breath at times SKIN: skin color, texture, turgor are normal, no rashes or significant lesions LUNGS: Coarse breath sounds HEART: regular rate & rhythm and no murmurs and no lower extremity edema ABDOMEN:abdomen soft, non-tender and normal bowel sounds NEURO: alert & oriented x 3 with fluent speech, no focal motor/sensory deficits  LABORATORY DATA:  I have reviewed the data as listed CMP Latest Ref Rng & Units 03/08/2020 03/17/2020 03/21/2020  Glucose 70 - 99 mg/dL 64(L) - 89  BUN 8 - 23 mg/dL 86(H) - 71(H)  Creatinine 0.44 - 1.00 mg/dL 7.95(H) - 7.03(H)  Sodium 135 - 145 mmol/L 141 138 140  Potassium 3.5 - 5.1 mmol/L 5.0 4.0 4.1  Chloride 98 - 111 mmol/L 104 - 100  CO2 22 - 32 mmol/L 18(L) - 22  Calcium 8.9 - 10.3 mg/dL 7.6(L) - 8.2(L)  Total Protein 6.5 - 8.1 g/dL 4.6(L) - -  Total Bilirubin 0.3 - 1.2 mg/dL 0.9 - -  Alkaline Phos 38 - 126 U/L 62 - -  AST 15 - 41 U/L 27 - -  ALT 0 - 44 U/L 20 - -    Lab Results  Component Value Date   WBC 13.4 (H) 03/08/2020   HGB 8.7 (L) 03/08/2020   HCT 27.0 (L) 03/08/2020   MCV 100.4 (H) 03/08/2020   PLT 64 (L) 03/08/2020   NEUTROABS 11.2 (H) 03/08/2020    DG Chest 2 View  Result Date: 02/28/2020 CLINICAL DATA:  Dyspnea, productive cough, pre-bronchoscopy EXAM: CHEST - 2 VIEW COMPARISON:  01/30/2020 chest radiograph. FINDINGS: Vascular stent overlies the left axilla. Stable cardiomediastinal silhouette with top-normal heart  size. No pneumothorax. No pleural effusion. Left perihilar 2.7 cm nodule is unchanged. No overt pulmonary edema. IMPRESSION: Stable left perihilar 2.7 cm nodule. No acute cardiopulmonary disease. Electronically Signed   By: Ilona Sorrel M.D.   On: 02/28/2020 09:05   CT Head Wo Contrast  Result Date: 02/15/2020 CLINICAL DATA:  Headache.  Lung cancer. EXAM: CT HEAD WITHOUT CONTRAST TECHNIQUE: Contiguous axial images were obtained from the base of the skull through the vertex without intravenous contrast. COMPARISON:  MRI  12/01/2019 FINDINGS: Brain: There is an area of low-density noted in the right parietal subcortical white matter, new since prior MRI. Given history of lung cancer, cannot exclude metastasis in this region. No hemorrhage or hydrocephalus. Vascular: No hyperdense vessel or unexpected calcification. Skull: No acute calvarial abnormality. Sinuses/Orbits: Visualized paranasal sinuses and mastoids clear. Orbital soft tissues unremarkable. Other: None IMPRESSION: Area of subcortical low-density in the right parietal lobe. Cannot exclude focal lesion/metastasis. Consider further evaluation with MRI with and without contrast. Electronically Signed   By: Rolm Baptise M.D.   On: 02/15/2020 11:42   CT CHEST WO CONTRAST  Result Date: 02/29/2020 CLINICAL DATA:  Pneumothorax EXAM: CT CHEST WITHOUT CONTRAST TECHNIQUE: Multidetector CT imaging of the chest was performed following the standard protocol without IV contrast. COMPARISON:  Radiograph same day FINDINGS: Cardiovascular: Aortic atherosclerosis is noted. There are coronary artery calcifications present. The heart size is normal. There is no pericardial thickening or effusion. A left axillary probable subclavian stent is seen. Mediastinum/Nodes: There are no enlarged mediastinal, hilar or axillary lymph nodes. The thyroid gland, trachea and esophagus demonstrate no significant findings. Lungs/Pleura: There is a small to moderate left-sided pneumothorax. A anterior left-sided pigtail catheter is seen with the tip projecting at the left lung base there is a surrounding area of consolidation/contusion around the pleural catheter at the base. Again noted is an anterior left upper lobe 3.5 cm pulmonary mass as on the prior exam. There are 2 stable spiculated nodular opacity seen within the left upper lobe the largest measuring 7 mm, series 6, image 65. A stable 6 mm pulmonary nodule seen in the posterior right upper lung. Extensive centrilobular emphysematous changes are  seen. No midline shift is noted. Upper abdomen: Bilateral renal atrophy with scattered calcifications are seen. Musculoskeletal/Chest wall: There is no chest wall mass or suspicious osseous finding. No acute osseous abnormality small amount of subcutaneous emphysema seen along the anterior chest wall. IMPRESSION: 1. Small to moderate left-sided pneumothorax with a anterior left-sided pigtail catheter. The tip projecting at the lung base with surrounding pulmonary contusion/atelectasis. 2. Stable stable 3.5 cm pulmonary mass in the anterior left upper lobe. 3. Stable bilateral subcentimeter pulmonary nodules. 4.  Emphysema (ICD10-J43.9). 5.  Aortic Atherosclerosis (ICD10-I70.0). Electronically Signed   By: Prudencio Pair M.D.   On: 02/29/2020 19:14   CT Angio Chest PE W/Cm &/Or Wo Cm  Result Date: 03/25/2020 CLINICAL DATA:  Shortness of breath. EXAM: CT ANGIOGRAPHY CHEST WITH CONTRAST TECHNIQUE: Multidetector CT imaging of the chest was performed using the standard protocol during bolus administration of intravenous contrast. Multiplanar CT image reconstructions and MIPs were obtained to evaluate the vascular anatomy. CONTRAST:  56m OMNIPAQUE IOHEXOL 350 MG/ML SOLN COMPARISON:  February 29, 2020. FINDINGS: Cardiovascular: Satisfactory opacification of the pulmonary arteries to the segmental level. No evidence of pulmonary embolism. Normal heart size. No pericardial effusion. Atherosclerosis of thoracic aorta is noted without aneurysm or dissection. Mediastinum/Nodes: Status post thyroidectomy. Esophagus is unremarkable. No adenopathy is noted. Lungs/Pleura: No  pneumothorax or pleural effusion is noted. Bilateral lower lobe and right upper lobe opacities are noted concerning for multifocal pneumonia. 2.9 cm left upper lobe mass is again noted concerning for malignancy. Upper Abdomen: No acute abnormality. Musculoskeletal: No osseous abnormality is noted. Large amount of subcutaneous emphysema is seen over the left  chest wall and supraclavicular region. Review of the MIP images confirms the above findings. IMPRESSION: 1. No definite evidence of pulmonary embolus. 2. Bilateral lower lobe and right upper lobe opacities are noted concerning for multifocal pneumonia. 3. 2.9 cm left upper lobe mass is noted concerning for malignancy. 4. Large amount of subcutaneous emphysema is seen over the left chest wall and supraclavicular region. 5. Aortic atherosclerosis. Aortic Atherosclerosis (ICD10-I70.0). Electronically Signed   By: Marijo Conception M.D.   On: 03/16/2020 12:52   MR BRAIN WO CONTRAST  Result Date: 02/29/2020 CLINICAL DATA:  CNS staging. Repeat due to excessively motion degraded postcontrast imaging to be used for treatment purposes EXAM: MRI HEAD WITHOUT CONTRAST TECHNIQUE: Multiplanar, multiecho pulse sequences of the brain and surrounding structures were obtained without intravenous contrast. COMPARISON:  02/20/2020 FINDINGS: Very motion degraded study, even more than the inadequate prior. The patient was sedated and was sleeping during much exam, which accentuated the motion. Even when aroused, motion with limiting factor. The technologist used maximal head stabilizers to no benefit. Given the degree of motion and the patient's end-stage renal disease status, additional gadolinium exposure was deemed to be of no justified benefit. Known metastatic disease with nonprogressive scattered areas of vasogenic edema in the left frontal, left frontal parietal, right parietal, and right anterior temporal regions. No acute infarct, hydrocephalus, or acute hemorrhage. IMPRESSION: Motion degraded study, even worse than prior, such that the study was truncated before contrast administration. The patient's sedatives paradoxically diminished her ability to remain still. Electronically Signed   By: Monte Fantasia M.D.   On: 02/29/2020 09:55   MR BRAIN WO CONTRAST  Result Date: 02/16/2020 CLINICAL DATA:  Initial evaluation for  possible brain mass. EXAM: MRI HEAD WITHOUT CONTRAST TECHNIQUE: Multiplanar, multiecho pulse sequences of the brain and surrounding structures were obtained without intravenous contrast. COMPARISON:  Comparison made with prior CT from earlier same day as well as recent brain MRI from 12/01/2019. FINDINGS: Brain: There has been interval development of multifocal areas of T2/FLAIR hyperintensity involving both cerebral and cerebellar hemispheres, consistent with vasogenic edema related to new metastatic disease. These foci are predominantly positioned along the gray-white matter differentiation. Largest area within the left cerebral hemisphere seen at the anterior left frontal lobe and measures 18 mm (series 11, image 17). Largest area within the right cerebral hemisphere seen at the right temporal occipital junction and measures 2.3 cm (series 11, image 12). Largest infratentorial area seen within r the right cerebellum and measures 1.7 cm. No significant regional mass effect about any of these foci at this time. There are at least 10 lesions in total. Evaluation for underlying lesion size limited given lack of IV contrast, although 1 lesion positioned at the subcortical right parietal lobe measures 5 mm, measured well on T2 weighted sequence (series 10, image 16). Few scattered foci of susceptibility artifacts seen associated with these lesions, consistent with necrosis and/or hemorrhage. Underlying atrophy with chronic microvascular ischemic disease again noted. No evidence for acute or subacute infarct. Gray-white matter differentiation otherwise maintained. Ventricles within normal limits without hydrocephalus. No extra-axial fluid collection. Pituitary gland and suprasellar region within normal limits. Midline structures intact. Vascular: Major intracranial  vascular flow voids are maintained. Skull and upper cervical spine: Craniocervical junction within normal limits. Bone marrow signal intensity normal. No  focal marrow replacing lesion. No scalp soft tissue abnormality. Sinuses/Orbits: Patient status post bilateral ocular lens replacement. Paranasal sinuses are largely clear. No significant mastoid effusion. Other: None. IMPRESSION: Interval development of multifocal areas of T2/FLAIR hyperintensity involving both cerebral and cerebellar hemispheres, consistent with vasogenic edema related to new metastatic disease. No significant regional mass effect at this time. Follow-up examination with postcontrast imaging could be performed for complete assessment as clinically warranted. Please note that the risk of NSF is considered to be negligible with the use of group II agents (Gadavist). Electronically Signed   By: Jeannine Boga M.D.   On: 02/16/2020 01:05   MR BRAIN W WO CONTRAST  Result Date: 02/20/2020 CLINICAL DATA:  Metastatic disease. Staging. Patient with known diagnosis of lung cancer. EXAM: MRI HEAD WITHOUT AND WITH CONTRAST TECHNIQUE: Multiplanar, multiecho pulse sequences of the brain and surrounding structures were obtained without and with intravenous contrast. CONTRAST:  67m GADAVIST GADOBUTROL 1 MMOL/ML IV SOLN COMPARISON:  MRI 02/15/2020 FINDINGS: Brain: Multiple peripherally enhancing metastatic lesions on series 9, including: *6 mm lesion in the left frontal lobe (image 21). *6 mm lesion in the high left parietal lobe (image 118). *2 mm lesion in the high left frontal lobe (image 112). *6 mm lesion in the right parieto-occipital region (image 95). *Punctate lesion in the posterior left temporal lobe (image 84). *7 mm lesion in the posterior right temporal lobe (image 76) with additional tiny adjacent lesion (image 72). *Approximately 7 mm lesion in the anterior right temporal lobe (image 51) with limited evaluation given motion. *5 mm lesion in the right cerebellar hemisphere (image 35) *4 mm lesion in the cerebellar vermis (image 56) with likely additional punctate additional lesion (image  60). These lesions have associated mild surrounding vasogenic edema without substantial mass effect. No midline shift. Basal cisterns are patent. Some of these lesions demonstrate mild susceptibility artifact, compatible with internal hemorrhage. Otherwise, no acute hemorrhage. No acute infarct. Mild cerebral atrophy with ex vacuo ventricular dilation. No hydrocephalus. Vascular: Major arterial flow voids are maintained at the skull base. Skull and upper cervical spine: Normal marrow signal. Sinuses/Orbits: Negative. Other: No mastoid effusion. IMPRESSION: Numerous infratentorial and supratentorial enhancing metastatic lesions, as detailed above. Mild associated vasogenic edema without substantial mass effect. Electronically Signed   By: FMargaretha SheffieldMD   On: 02/20/2020 09:16   DG Chest Portable 1 View  Result Date: 03/21/2020 CLINICAL DATA:  Chest pain, shortness of breath. EXAM: PORTABLE CHEST 1 VIEW COMPARISON:  March 03, 2020. FINDINGS: Stable cardiomediastinal silhouette. Minimal left apical pneumothorax is noted. Stable left perihilar mass is noted increased right basilar atelectasis or infiltrate is noted. Stable left basilar atelectasis or infiltrate is noted. Small pleural effusions may be present. Bony thorax is unremarkable. Subcutaneous emphysema is seen over the left lateral chest wall and left supraclavicular region. IMPRESSION: Minimal left apical pneumothorax is noted. Stable left perihilar mass. Increased right basilar atelectasis or infiltrate is noted. Stable left basilar atelectasis or infiltrate is noted. Small pleural effusions may be present. Electronically Signed   By: JMarijo ConceptionM.D.   On: 03/09/2020 09:31   DG Chest Port 1 View  Result Date: 03/03/2020 CLINICAL DATA:  Pneumothorax. EXAM: PORTABLE CHEST 1 VIEW COMPARISON:  03/02/2020; 03/01/2020; 02/29/2020; chest CT-02/29/2020 FINDINGS: Grossly unchanged cardiac silhouette and mediastinal contours. Interval reduction in  persistent tiny left apical  pneumothorax. The amount of right lateral chest wall subcutaneous emphysema has increased in the interval. Trace left-sided effusion with associated left basilar heterogeneous/consolidative opacities. The lungs remain hyperexpanded with diffuse slightly nodular thickening the pulmonary interstitium. Vascular stents overlie the medial aspect the left upper arm. No acute osseous abnormalities. Surgical clips overlie the thoracic inlet. IMPRESSION: 1. Interval reduction in persistent tiny left apical pneumothorax. 2. Interval increase in amount of left lateral chest wall subcutaneous emphysema. 3. Trace left-sided effusion with associated left basilar opacities, atelectasis versus infiltrate. Electronically Signed   By: Sandi Mariscal M.D.   On: 03/03/2020 14:05   DG Chest Port 1 View  Result Date: 03/02/2020 CLINICAL DATA:  74 year old female with pneumothorax. EXAM: PORTABLE CHEST 1 VIEW COMPARISON:  Chest radiograph dated 03/03/2019. FINDINGS: There has been interval removal of the left chest tube and increase in the size of left apical pneumothorax compared to the prior radiograph. The left apical pneumothorax measures approximately 2.5 cm to the apical pleural surface. Left perihilar mass measuring approximately 2.9 x 3.2 cm as previously noted. There is a small left pleural effusion. Left lung base atelectasis and less likely infiltrate. Stable cardiac silhouette. Atherosclerotic calcification of the aorta. No acute osseous pathology. Interval development left chest wall soft tissue emphysema. A vascular stent noted in the left axilla. IMPRESSION: 1. Interval removal of the left chest tube and increase in the size of the left apical pneumothorax compared to the prior radiograph. 2. Interval development of left chest wall soft tissue emphysema. These results were called by telephone at the time of interpretation on 03/02/2020 at 3:27 pm to provider [facility] , who verbally  acknowledged these results. Electronically Signed   By: Anner Crete M.D.   On: 03/02/2020 15:36   DG Chest Port 1 View  Result Date: 03/02/2020 CLINICAL DATA:  Pneumothorax. EXAM: PORTABLE CHEST 1 VIEW COMPARISON:  03/01/2020.  CT 02/29/2020. FINDINGS: Left chest tube in stable position. Tiny left apical pneumothorax again noted, improved from prior exam. Previously noted left mid lung mass lesion is again noted. This is better demonstrated on today's exam compared to the prior study 03/01/2020. Chronic interstitial changes are again noted. Small left pleural effusion again noted. Stable cardiomegaly. Left axillary stent noted. Surgical clips in the neck noted. IMPRESSION: 1. Left chest tube in stable position. Tiny left apical pneumothorax again noted, improved from prior exam. 2. Previously noted left mid lung mass lesion again noted. This is better demonstrated on today's exam compared to prior study 03/01/2020. Chronic interstitial changes are again noted. Small left pleural effusion again noted. 3. Stable cardiomegaly. Electronically Signed   By: Marcello Moores  Register   On: 03/02/2020 07:18   DG CHEST PORT 1 VIEW  Result Date: 03/01/2020 CLINICAL DATA:  Chest tube. EXAM: PORTABLE CHEST 1 VIEW COMPARISON:  02/29/2020.  10/11/2019.  04/13/2019. FINDINGS: Left chest tube in stable position. Stable left apical pneumothorax. Cardiomegaly. No pulmonary venous congestion. Chronic interstitial disease again noted. Small left pleural effusion again noted. Left axillary stents noted. Surgical clips in the neck. IMPRESSION: 1. Left chest tube in stable position. Stable left apical pneumothorax. Small left pleural effusion again noted. 2. Chronic interstitial lung disease again noted. Electronically Signed   By: Marcello Moores  Register   On: 03/01/2020 06:42   DG CHEST PORT 1 VIEW  Result Date: 02/29/2020 CLINICAL DATA:  Follow-up pneumothorax EXAM: PORTABLE CHEST 1 VIEW COMPARISON:  Films from earlier in the same  day. FINDINGS: Chest tube is again noted on the  left. Tiny left apical pneumothorax is noted but improved when compared with the prior exam. Lungs remain hyperinflated. Soft tissue mass in the midportion of the left chest is again seen. Vascular stents are again noted in the left arm. No bony abnormality is seen. IMPRESSION: Small left apical pneumothorax improved from the prior exam. Chest tube remains in place. Left mid lung mass is again seen and stable. Electronically Signed   By: Inez Catalina M.D.   On: 02/29/2020 21:37   DG Chest Portable 1 View  Result Date: 02/29/2020 CLINICAL DATA:  Left chest tube placement. EXAM: PORTABLE CHEST 1 VIEW COMPARISON:  Radiograph earlier today. FINDINGS: Placement of left-sided chest tube with tip directed towards the base. Decreased size of left pneumothorax with moderate residual at the apex, pleural line visualized under the posterior fourth rib. Emphysema and left perihilar nodule again seen heart is normal in size. Vascular stent in the left arm. Bones are under mineralized. IMPRESSION: 1. Placement of left-sided chest tube with decreased size of left pneumothorax, moderate residual at the apex. 2. Emphysema and left perihilar nodule, unchanged. Electronically Signed   By: Keith Rake M.D.   On: 02/29/2020 18:11   DG Chest Port 1 View  Addendum Date: 02/29/2020   ADDENDUM REPORT: 02/29/2020 16:41 ADDENDUM: These results were called by telephone at the time of interpretation on 02/29/2020 at 4:40 pm to provider [facility] , who verbally acknowledged these results. Electronically Signed   By: Fidela Salisbury MD   On: 02/29/2020 16:41   Result Date: 02/29/2020 CLINICAL DATA:  Dyspnea EXAM: PORTABLE CHEST 1 VIEW COMPARISON:  02/28/2020 FINDINGS: Large left pneumothorax is present. There is hyperexpansion of the left hemithorax in keeping with changes of at least mild tension physiology. Underlying COPD again noted with hyperexpansion of the lungs at baseline.  Previously noted left mid lung zone mass is not well visualized on this examination. No pneumothorax on the right. No pleural effusion. Cardiac size is within normal limits. Pulmonary vascularity is normal. Surgical clips are seen at the neck base in keeping with changes of probable prior thyroidectomy. Vascular stent noted within the central left upper extremity no acute bone abnormality. IMPRESSION: Large left pneumothorax, new since prior examination, with some degree of tension physiology. Known left mid lung zone mass not well visualized. COPD Electronically Signed: By: Fidela Salisbury MD On: 02/29/2020 16:30   DG CHEST PORT 1 VIEW  Result Date: 02/28/2020 CLINICAL DATA:  Status post bronchoscopy EXAM: PORTABLE CHEST 1 VIEW COMPARISON:  02/28/2020 FINDINGS: Cardiac shadow is stable. Left-sided mid chest mass is again identified and stable. No post bronchoscopy pneumothorax is seen. Vascular stent on the left is noted. The lungs are otherwise clear. IMPRESSION: No evidence of post bronchoscopy pneumothorax. Stable left mid chest mass. Electronically Signed   By: Inez Catalina M.D.   On: 02/28/2020 17:40   CT Super D Chest Wo Contrast  Result Date: 02/17/2020 CLINICAL DATA:  Left upper lobe lung mass preop for EBUS. EXAM: CT CHEST WITHOUT CONTRAST TECHNIQUE: Multidetector CT imaging of the chest was performed using thin slice collimation for electromagnetic bronchoscopy planning purposes, without intravenous contrast. COMPARISON:  Chest CT 11/18/2019 FINDINGS: Cardiovascular: The heart is normal in size. No pericardial effusion. There is mild tortuosity and moderate calcification of the thoracic aorta which is stable. Enlarged pulmonary artery suggesting pulmonary hypertension. Mediastinum/Nodes: Small scattered mediastinal and hilar lymph nodes appears stable. No obvious adenopathy on this noncontrast examination. The esophagus is grossly normal. Lungs/Pleura: Stable  severe emphysematous changes and hit  stable areas of pulmonary scarring. The left upper lobe lung mass measures 3.3 x 3.0 cm and previously measured 2.2 x 2.0 cm. More superiorly in the left upper lobe there is a stable 6 mm nodule. Subpleural nodular lesion in the left upper lobe on image 73/4 measures 6 mm and appears stable. Upper Abdomen: No significant upper abdominal findings. Musculoskeletal: No significant bony findings. IMPRESSION: 1. Interval enlargement of the left upper lobe lung mass, now measuring 3.3 x 3.0 cm. 2. Stable 6 mm left upper lobe pulmonary nodules. 3. Stable severe emphysematous changes and pulmonary scarring. 4. Enlarged pulmonary artery suggesting pulmonary hypertension. 5. Stable atherosclerotic calcifications involving the thoracic aorta and branch vessels. 6. Emphysema and aortic atherosclerosis. Aortic Atherosclerosis (ICD10-I70.0) and Emphysema (ICD10-J43.9). Electronically Signed   By: Marijo Sanes M.D.   On: 02/17/2020 18:44   DG C-ARM BRONCHOSCOPY  Result Date: 02/28/2020 C-ARM BRONCHOSCOPY: Fluoroscopy was utilized by the requesting physician.  No radiographic interpretation.    ASSESSMENT AND PLAN: This is a very pleasant 74 year old African-American female with metastatic non-small cell lung cancer, adenocarcinoma.  She presented with a left upper lobe lung mass in addition to left hilar/suprahilar and suspicious bone metastasis at T10/T11.  She developed brain metastases in September 2021.  At that time, we did not yet have tissue diagnosis and she underwent bronchoscopy on 02/28/2020.  Course was complicated by pneumothorax requiring hospitalization and now hospitalized for bilateral pneumonia.  Due to her recurrent hospitalizations, she has not been able to come to the cancer center for discussion of treatment options.  I had a lengthy discussion with the patient today regarding her diagnosis, prognosis, and treatment options.  I expressed concern that her performance status was too poor to consider  systemic treatment.  Additionally, it will be difficult to treat her due to her end-stage renal disease.  The patient states understanding and states that she knows that she is simply too weak/tired to consider treatment.  She also tells me that she is not interested in radiation to her brain.  With her permission, I contacted her husband by telephone and discussed the same information with him.  The patient has been was on his way to the hospital was able to meet with me at the bedside along with the NP from palliative care.  We had a lengthy discussion regarding her diagnosis, prognosis, and treatment options from a lung cancer standpoint.  Also discussed transitioning to comfort measures and hospice.  After a lengthy discussion, the patient has expressed that she wishes to enroll with hospice.  She is leaning towards residential hospice upon discharge.  The husband would like to gather family members this weekend before transitioning to full comfort measures.  Anticipate that she will transition to full comfort measures on Monday 10/18.  For now, I recommend continuation of dexamethasone 4 mg 3 times daily as this seems to be controlling her headaches from her vasogenic edema.  Please call medical oncology for any additional questions or concerns arise.    LOS: 2 days   Mikey Bussing, DNP, AGPCNP-BC, AOCNP 03/09/20

## 2020-03-09 NOTE — Consult Note (Addendum)
Palliative Medicine Inpatient Consult Note  Reason for consult:  Goals of Care  HPI:  Per intake H&P --> Natasha [name] is an 74 y.o. female past medical history metastatic lung cancer, paroxysmal atrial fibrillation on Eliquis congestive heart failure history of DVT and PE on Eliquis end-stage renal disease Monday Wednesday and Friday seizure disorder, with recent admission for left-sided pneumothorax on 02/28/2020 post bronchoscopy with status post chest tube placement and discharged home on 03/03/2020 on 3 L of oxygen palliative care was consulted at that time comes into the [name] for worsening shortness of breath.  She relates that started the day prior to admission she is also been having chest pain body aches and lower extremity swelling accompanied by palpitations.  She had her dialysis the day prior to admission.  In the ED she was found tachypneic tachycardic requiring 4 L of oxygen with leukocytosis of 11 platelet count of 19, BNP of 446 troponins of 83 CT angio of the chest showed no PE but it did show multifocal pneumonia SARS-CoV-2 and influenza PCR.  All he is well-known to the palliative care service she has been seen multiple times over the last 1 month.  She was discharged just last week from [facility]  At that time she and her husband wanted to continue with outpatient dialysis and were not at a point where they were ready to resign to hospice care.  It appears that over the last week she had worsening of symptoms and has been admitted for a multifocal pneumonia.  We plan to further discuss potential hospice care assuming that the patient is now interested in this modality of care.  Clinical Assessment/Goals of Care: I have reviewed medical records including EPIC notes, labs and imaging, received report from bedside RN, assessed the patientwho is alert and oriented.   I met withOllieFranklinto furtherdiscuss diagnosis prognosis, GOC, EOL wishes, disposition and  options.  I introduced Palliative Medicine as specialized medical care for people living with serious illness. It focuses on providing relief from the symptoms and stress of a serious illness. The goal is to improve quality of life for both the patient and the family.  As per prior historical review --> She shares that she is from [place]. She has lived in [place] for the greater portion of her life. She has been married to her husband, Natasha [name] for the past 42 five years. She has three children two sons and a daughter. 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 She enjoys cooking, spending time with her family, and sewing garments. She is a faithful woman and practices within the [name] den[employer].   In terms of mobility Natasha [name] shares that she has been able to participate in all bADLs. Natasha [name] been able to bath, dress herself, cook and feed herself prior to her September admission. She attests to needing a walker and recently got a rollator.  Natasha [name] states that since going home she was "going down".  She states that she was having severe shortness of breath, fatigue, and weakness.  She was unable to get out of bed and participate in any activities due to the symptoms.  She was told that she has multifocal pneumonia.  She states that she got very upset as when she was diagnosed with this.  She expresses that everything has been going downhill since her bronchoscopy.  A detailed discussion was had today regarding  advanced directives- there are none on file.Patient would defer to her husband Natasha [name] to make decisions for her if needed.  The difference between a aggressive medical intervention path and a palliative comfort care path for this patient at this time was had.We discussed the reality that her cancer is advanced and that additional therapies may help and offer her  more time though there would be a trade off in terms of tolerance to those as they can often make patients possibly feel worse.   I again introduced the topic of hospice care.  I described hospice as a service for patients who have a life expectancy of < 6 months. It preserves dignity and quality at the end phases of life.   I was able to review and explain the hospice benefit to Natasha [name]  I shared that on hospice she would need to stop hemodialysis and this would make her life expectancy shorter we talked about 10-14 days in duration. I shared that hospice shifts the focus from curative to symptom based.    Natasha [name] and her husband alike are interested in knowing more about her cancer and what treatment options would be available. They feel very strongly about meeting with the oncology team prior to making any decisions comfort or hospice oriented.  [name] and I reviewed her prior MOST form which entail DNAR/DNI, limited interventions, IV Abx, IVF, and trial of feeding tube.   Discussed the importance of continued conversation with family and their medical providers regarding overall plan of care and treatment options, ensuring decisions are within the context of the patients values and GOCs.  Decision Maker: Patient can make decisions for herself.  SUMMARY OF RECOMMENDATIONS DNAR/DNI  Will reach out to [facility] to offer additional support  Spiritual support - Natasha [name]  Patient and husband asked to hear from Oncology team prior to making any additional decisions regarding comfort/hospice  Code Status/Advance Care Planning: DNAR/DNI   Palliative Prophylaxis:   Oral care, mobility, aspiration precautions, delirium precautions  Additional Recommendations (Limitations, Scope, Preferences):  Treat what is treatable  Psycho-social/Spiritual:   Desire for further Chaplaincy support:  Yes patient is Natasha [name]  Additional Recommendations:  Education on hospice.  Education on metastatic  disease.   Prognosis: Poor in the setting of metastatic lung cancer  Discharge Planning: Unclear  PPS: 30%   This conversation/these recommendations were discussed with patient primary care team, Dr. Marthenia Rolling  Time In: 0645 Time Out: 0755 Total Time: 12 Greater than 50%  of this time was spent counseling and coordinating care related to the above assessment and plan. ________________________________________________________ Addendum: Natasha [name], Natasha [name], and myself met this afternoon.  Natasha [name] shared with Natasha [name] that brain radiation would be 2 weeks of treatment every day sequentially.  She shared that in regard to chemotherapy there are options but due to Natasha [name]'s renal failure they would have to give a decreased dose.  Discussed that there are side effects of associated with both radiation and chemotherapy which could potentially worsen her already fragile state.  Natasha [name] shared that she would prefer not to go through radiation or chemotherapy.  She went on to say that she is also tired of going to dialysis as these treatments have been extremely hard on her body.  I shared that given Natasha [name]'s goals at this point it was appropriate to consider hospice care.  I stated that outpatient palliative care would not be sufficient to address her complex symptom management needs.  We discussed what hospice in  the home would look like as opposed to hospice inpatient.  I shared that one of the advantages of inpatient hospice is the level of nursing support that would be offered, this would enable her family to focus on spending time with her as opposed to caring for her.  Natasha [name] and her husband would like to discuss this further with her children.  We discussed We talked about transition to comfort measures in house and what that would entail inclusive of medications to control pain, dyspnea, agitation, nausea, itching, and hiccups. We discussed stopping  all uneccessary measures such as blood draws, needle sticks, and frequent vital signs.   Allowed time as this is clearly an emotional time for Natasha [name] and her husband. Utilized reflective listening throughout our time together.   By the end of our conversation the decision was made to transition to focus oriented care.  At this point in time Natasha [name] would like to continue with antibiotics though they have determined they would like to stop hemodialysis treatments and not provide any additional treatments for her metastatic cancer.  Patient will likely transition to full comfort oriented care on Monday though this will enable the weekend for her family to visit and spend time with her.  Palliative care will continue to provide ongoing support during this difficult time.  I was able to reach out to the hospice liaison for [place] who will meet with the family to further discuss whether or not home with hospice or [facility] is the best option.  Time In: 1430 Time Out: 1530 Total Time: 60 Greater than 50%  of this time was spent counseling and coordinating care related to the above assessment and plan.  [place] Team Team Cell Phone: [phone] Please utilize secure chat with additional questions, if there is no response within 30 minutes please call the above phone number  Palliative Medicine Team providers are available by phone from 7am to 7pm daily and can be reached through the team cell phone.  Should this patient require assistance outside of these hours, please call the patient's attending physician.

## 2020-03-09 NOTE — Progress Notes (Signed)
[facility] PROGRESS NOTE    Progress Note  Natasha [name]  JOA:[id number] DOB: [birth year] DOA: 03/16/2020 PCP: [name], MD     Brief Narrative:   Natasha [name] is an 74 y.o. female past medical history metastatic lung cancer, paroxysmal atrial fibrillation on Eliquis congestive heart failure history of DVT and PE on Eliquis end-stage renal disease Monday Wednesday and Friday seizure disorder, with recent admission for left-sided pneumothorax on 02/28/2020 post bronchoscopy with status post chest tube placement and discharged home on 03/03/2020 on 3 L of oxygen palliative care was consulted at that time comes into the hospital for worsening shortness of breath.  She relates that started the day prior to admission she is also been having chest pain body aches and lower extremity swelling accompanied by palpitations.  She had her dialysis the day prior to admission.  In the ED she was found tachypneic tachycardic requiring 4 L of oxygen with leukocytosis of 11 platelet count of 19, BNP of 446 troponins of 83 CT angio of the chest showed no PE but it did show multifocal pneumonia SARS-CoV-2 and influenza PCR  03/09/2020: Patient seen.  Input from the palliative care team and oncology team is highly appreciated.  Goal of care has been addressed.  Patient is hospice appropriate.  Patient continues to report problems with coughing.  Oncology team has advised continuing dexamethasone for now.  Apparently, patient had significant headaches without dexamethasone.  As per the palliative care team, patient's husband wants patient to continue antibiotics over the weekend.  Assessment/Plan:   Present on admission Sepsis due to multifocal pneumonia/acute respiratory failure on chronic: Patient presented with worsening shortness of breath requiring 4 L of oxygen to keep saturations greater than 90%. CT angio of the chest was personally reviewed showed no PE, but it did show bilateral  lower lobe infiltrates concerning for pneumonia.  A meter left upper lobe mass and large amounts of subcutaneous emphysema over the left chest wall She was started on IV vancomycin and cefepime on admission he has remained afebrile since then her tachycardia and tachypnea have improved. She still has a persistent leukocytosis 03/09/2020: We will complete the course of antibiotics as requested by patient's husband.  ESRD on dialysis [facility]): High anion gap metabolic acidosis, nephrology was consulted Alysis today 03/08/2020. 03/09/2020: Further management will depend on the goal of care.  We will go ahead and discontinue tramadol, considering end-stage renal disease to avoid opioid-induced neurotoxicity.  Better options in the setting of renal impairment include methadone, fentanyl or Dilaudid.  Percocet, hydrocodone may be acceptable as well.  Is also noted that the palliative care team may have prescribed morphine.  We will also hold morphine unless comfort directed care has been confirmed.  We will go ahead and discontinue morphine for now.  Elevated troponins: They have basically remained flat likely demand ischemia in the setting of renal failure, multifocal pneumonia and A. 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  Thrombocytopenia: No signs of bleeding.  Paroxysmal atrial fibrillation with RVR: She was continue on Eliquis started on a Cardizem drip improved will transition to oral Cardizem the RVR is probably sepsis driven. Her heart rate has been less than 100 persistently, will transition IV diltiazem to oral diltiazem. Eliquis.  Hypothyroidism: Continue Synthroid.    Metastatic lung carcinoma, left (HCC)     DVT prophylaxis: Eliquis Family Communication:Husband Status is: Inpatient  Remains inpatient appropriate because:Hemodynamically unstable  Dispo: The patient is from: Home              Anticipated d/c is to: Home               Anticipated d/c date is: > 3 days              Patient currently is not medically stable to d/c.        Code Status:     Code Status Orders  (From admission, onward)         Start     Ordered   03/11/2020 1526  Do not attempt resuscitation (DNR)  Continuous       Question Answer Comment  In the event of cardiac or respiratory ARREST Do not call a "code blue"   In the event of cardiac or respiratory ARREST Do not perform Intubation, CPR, defibrillation or ACLS   In the event of cardiac or respiratory ARREST Use medication by any route, position, wound care, and other measures to relive pain and suffering. May use oxygen, suction and manual treatment of airway obstruction as needed for comfort.      03/25/2020 1527        Code Status History    Date Active Date Inactive Code Status Order ID Comments User Context   03/02/2020 1143 03/03/2020 2238 Partial Code [id number]  Rosezella Rumpf, NP Inpatient   02/29/2020 1928 03/02/2020 1142 Partial Code [id number]  Marcelyn Bruins, MD ED   02/15/2020 1350 02/21/2020 2240 DNR [id number]  Mckinley Jewel, MD ED   01/30/2020 2318 02/03/2020 2347 Full Code [id number]  Rise Patience, MD Inpatient   11/18/2019 1414 11/21/2019 0010 Full Code [id number]  Norval Morton, MD ED   04/13/2019 1637 04/16/2019 2127 Full Code [id number]  Norval Morton, MD ED   05/28/2018 1827 06/06/2018 1658 Full Code [id number]  Merton Border, MD ED   03/31/2018 0512 04/02/2018 2218 Full Code [id number]  Vianne Bulls, MD ED   03/20/2017 0129 03/28/2017 0051 Full Code [id number]  Phillips Grout, MD Inpatient   11/18/2016 1434 11/21/2016 1827 Full Code [id number]  Waldemar Dickens, MD ED   07/12/2016 0137 07/15/2016 1647 Full Code [id number]  Lily Kocher, MD ED   10/12/2015 2313 10/15/2015 1657 Full Code [id number]  Toy Baker, MD Inpatient   09/09/2014 1339 09/14/2014 2240 Full Code [id number]  Nita Sells, MD Inpatient   03/29/2014 2231 04/07/2014 1453 Full  Code [id number]  Shanda Howells, MD ED   05/12/2013 2132 05/18/2013 1605 Full Code [id number]  Berle Mull, MD Inpatient   Advance Care Planning Activity        IV Access:    Peripheral IV   Procedures and diagnostic studies:   No results found.   Medical Consultants:    None.  Anti-Infectives:   vanc and cefepime  Subjective:    Sherrie Mustache she relates her breathing is not better today she still feels tired fatigue and anorexic  Objective:    Vitals:   03/09/20 0400 03/09/20 0539 03/09/20 0700 03/09/20 1043  BP: 121/67 128/62 125/61   Pulse: 79  79   Resp: 13  15   Temp: 98.6 F (37 C)  98.6 F (37 C)   TempSrc: Oral  Oral   SpO2: 98%  96% 96%  Weight: 51.4 kg     Height:       SpO2: 96 % O2 Flow Rate (L/min): 5 L/min   Intake/Output Summary (Last  24 hours) at 03/09/2020 1623 Last data filed at 03/09/2020 0915 Gross per 24 hour  Intake 240 ml  Output 1386 ml  Net -1146 ml   Filed Weights   03/08/20 0259 03/08/20 1340 03/09/20 0400  Weight: 53.1 kg 52.7 kg 51.4 kg    Exam: General exam: In no acute distress. Respiratory system: Decreased air entry globally.  Diffuse crackles.  Cardiovascular system: S1 & S2 heard, RRR. No JVD. Gastrointestinal system: Abdomen is nondistended, soft and nontender.  Extremities: Bilateral leg edema, 1+ to 2. Neuro: Patient is awake and alert.  Patient moves all extremities.    Data Reviewed:    Labs: Basic Metabolic Panel: Recent Labs  Lab 02/24/2020 0929 03/18/2020 0929 02/26/2020 1339 03/24/2020 1343 03/08/20 0255  NA 140  --   --  138 141  K 4.1   < >  --  4.0 5.0  CL 100  --   --   --  104  CO2 22  --   --   --  18*  GLUCOSE 89  --   --   --  64*  BUN 71*  --   --   --  86*  CREATININE 7.03*  --   --   --  7.95*  CALCIUM 8.2*  --   --   --  7.6*  MG  --   --  1.5*  --   --   PHOS  --   --  5.3*  --   --    < > = values in this interval not displayed.   GFR Estimated Creatinine Clearance:  5.1 mL/min (A) (by C-G formula based on SCr of 7.95 mg/dL (H)). Liver Function Tests: Recent Labs  Lab 03/08/20 0255  AST 27  ALT 20  ALKPHOS 62  BILITOT 0.9  PROT 4.6*  ALBUMIN 1.7*   No results for input(s): LIPASE, AMYLASE in the last 168 hours. No results for input(s): AMMONIA in the last 168 hours. Coagulation profile No results for input(s): INR, PROTIME in the last 168 hours. COVID-19 Labs  No results for input(s): DDIMER, FERRITIN, LDH, CRP in the last 72 hours.  Lab Results  Component Value Date   SARSCOV2NAA NEGATIVE 03/11/2020   SARSCOV2NAA NEGATIVE 02/29/2020   [place] NEGATIVE 02/25/2020   [place] NEGATIVE 02/15/2020    CBC: Recent Labs  Lab 02/27/2020 0929 03/03/2020 1343 03/08/20 0255 03/08/20 2015  WBC 11.5*  --  13.4*  --   NEUTROABS 9.8*  --  11.2*  --   HGB 8.5* 7.1* 7.0* 8.7*  HCT 28.3* 21.0* 22.8* 27.0*  MCV 100.0  --  100.4*  --   PLT 90*  --  64*  --    Cardiac Enzymes: No results for input(s): CKTOTAL, CKMB, CKMBINDEX, TROPONINI in the last 168 hours. BNP (last 3 results) No results for input(s): PROBNP in the last 8760 hours. CBG: No results for input(s): GLUCAP in the last 168 hours. D-Dimer: No results for input(s): DDIMER in the last 72 hours. Hgb A1c: No results for input(s): HGBA1C in the last 72 hours. Lipid Profile: No results for input(s): CHOL, HDL, LDLCALC, TRIG, CHOLHDL, LDLDIRECT in the last 72 hours. Thyroid function studies: No results for input(s): TSH, T4TOTAL, T3FREE, THYROIDAB in the last 72 hours.  Invalid input(s): FREET3 Anemia work up: No results for input(s): VITAMINB12, FOLATE, FERRITIN, TIBC, IRON, RETICCTPCT in the last 72 hours. Sepsis Labs: Recent Labs  Lab 03/01/2020 0929 03/08/20 0255  WBC 11.5* 13.4*  Microbiology Recent Results (from the past 240 hour(s))  Respiratory Panel by RT PCR (Flu A&B, Covid) - Nasopharyngeal Swab     Status: None   Collection Time: 02/29/20  4:19 PM   Specimen:  Nasopharyngeal Swab  Result Value Ref Range Status   SARS Coronavirus 2 by RT PCR NEGATIVE NEGATIVE Final    Comment: (NOTE) SARS-CoV-2 target nucleic acids are NOT DETECTED.  The SARS-CoV-2 RNA is generally detectable in upper respiratoy specimens during the acute phase of infection. The lowest concentration of SARS-CoV-2 viral copies this assay can detect is 131 copies/mL. A negative result does not preclude SARS-Cov-2 infection and should not be used as the sole basis for treatment or other patient management decisions. A negative result may occur with  improper specimen collection/handling, submission of specimen other than nasopharyngeal swab, presence of viral mutation(s) within the areas targeted by this assay, and inadequate number of viral copies (<131 copies/mL). A negative result must be combined with clinical observations, patient history, and epidemiological information. The expected result is Negative.  Fact Sheet for Patients:  PinkCheek.be  Fact Sheet for Healthcare Providers:  GravelBags.it  This test is no t yet approved or cleared by the [place] FDA and  has been authorized for detection and/or diagnosis of SARS-CoV-2 by FDA under an Emergency Use Authorization (EUA). This EUA will remain  in effect (meaning this test can be used) for the duration of the COVID-19 declaration under Section 564(b)(1) of the Act, 21 U.S.C. section 360bbb-3(b)(1), unless the authorization is terminated or revoked sooner.     Influenza A by PCR NEGATIVE NEGATIVE Final   Influenza B by PCR NEGATIVE NEGATIVE Final    Comment: (NOTE) The Xpert Xpress SARS-CoV-2/FLU/RSV assay is intended as an aid in  the diagnosis of influenza from Nasopharyngeal swab specimens and  should not be used as a sole basis for treatment. Nasal washings and  aspirates are unacceptable for Xpert Xpress SARS-CoV-2/FLU/RSV  testing.  Fact Sheet  for Patients: PinkCheek.be  Fact Sheet for Healthcare Providers: GravelBags.it  This test is not yet approved or cleared by the [place] FDA and  has been authorized for detection and/or diagnosis of SARS-CoV-2 by  FDA under an Emergency Use Authorization (EUA). This EUA will remain  in effect (meaning this test can be used) for the duration of the  Covid-19 declaration under Section 564(b)(1) of the Act, 21  U.S.C. section 360bbb-3(b)(1), unless the authorization is  terminated or revoked. Performed at [facility], [place] [address], [place], [place] [address]   Respiratory Panel by RT PCR (Flu A&B, Covid) - Nasopharyngeal Swab     Status: None   Collection Time: 03/11/2020  2:14 PM   Specimen: Nasopharyngeal Swab  Result Value Ref Range Status   SARS Coronavirus 2 by RT PCR NEGATIVE NEGATIVE Final    Comment: (NOTE) SARS-CoV-2 target nucleic acids are NOT DETECTED.  The SARS-CoV-2 RNA is generally detectable in upper respiratoy specimens during the acute phase of infection. The lowest concentration of SARS-CoV-2 viral copies this assay can detect is 131 copies/mL. A negative result does not preclude SARS-Cov-2 infection and should not be used as the sole basis for treatment or other patient management decisions. A negative result may occur with  improper specimen collection/handling, submission of specimen other than nasopharyngeal swab, presence of viral mutation(s) within the areas targeted by this assay, and inadequate number of viral copies (<131 copies/mL). A negative result must be combined with clinical observations, patient history,  and epidemiological information. The expected result is Negative.  Fact Sheet for Patients:  PinkCheek.be  Fact Sheet for Healthcare Providers:  GravelBags.it  This test is no t yet approved or cleared by the [place] FDA and  has been authorized for detection and/or diagnosis of SARS-CoV-2 by FDA under an Emergency Use Authorization (EUA). This EUA will remain  in effect (meaning this test can be used) for the duration of the COVID-19 declaration under Section 564(b)(1) of the Act, 21 U.S.C. section 360bbb-3(b)(1), unless the authorization is terminated or revoked sooner.     Influenza A by PCR NEGATIVE NEGATIVE Final   Influenza B by PCR NEGATIVE NEGATIVE Final    Comment: (NOTE) The Xpert Xpress SARS-CoV-2/FLU/RSV assay is intended as an aid in  the diagnosis of influenza from Nasopharyngeal swab specimens and  should not be used as a sole basis for treatment. Nasal washings and  aspirates are unacceptable for Xpert Xpress SARS-CoV-2/FLU/RSV  testing.  Fact Sheet for Patients: PinkCheek.be  Fact Sheet for Healthcare Providers: GravelBags.it  This test is not yet approved or cleared by the [place] FDA and  has been authorized for detection and/or diagnosis of SARS-CoV-2 by  FDA under an Emergency Use Authorization (EUA). This EUA will remain  in effect (meaning this test can be used) for the duration of the  Covid-19 declaration under Section 564(b)(1) of the Act, 21  U.S.C. section 360bbb-3(b)(1), unless the authorization is  terminated or revoked. Performed at [facility], [place] [address] [place], [place] [address]   Culture, blood (routine x 2) Call MD if unable to obtain prior to antibiotics being given     Status: None (Preliminary result)   Collection Time: 03/01/2020  6:27 PM   Specimen: BLOOD RIGHT FOREARM  Result Value Ref Range Status   Specimen Description BLOOD RIGHT FOREARM  Final   Special Requests   Final    BOTTLES DRAWN AEROBIC AND ANAEROBIC Blood Culture results may not be optimal due to an inadequate volume of blood received in culture bottles   Culture   Final    NO GROWTH 2 DAYS Performed at  [facility], [place] [address], [place], [place] [address]    Report Status PENDING  Incomplete     Medications:   . sodium chloride   Intravenous Once  . apixaban  2.5 mg Oral BID  . calcitRIOL  0.25 mcg Oral Q M,W,F-HD  . Chlorhexidine Gluconate Cloth  6 each Topical Q0600  . dexamethasone  4 mg Oral TID  . diltiazem  60 mg Oral Q6H  . fluticasone furoate-vilanterol  1 puff Inhalation Daily   And  . umeclidinium bromide  1 puff Inhalation Daily  . levothyroxine  125 mcg Oral QAC breakfast  . midodrine  10 mg Oral TID WC  . pantoprazole  40 mg Oral BID  . vancomycin variable dose per unstable renal function (pharmacist dosing)   Does not apply See admin instructions   Continuous Infusions: . ceFEPime (MAXIPIME) IV 1 g (03/08/20 2124)   Time spent: 35 minutes.   LOS: 2 days   [facility] [facility]  [facility]  03/09/2020, 4:23 PM

## 2020-03-10 DIAGNOSIS — Z66 Do not resuscitate: Secondary | ICD-10-CM | POA: Diagnosis not present

## 2020-03-10 DIAGNOSIS — Z7189 Other specified counseling: Secondary | ICD-10-CM | POA: Diagnosis not present

## 2020-03-10 DIAGNOSIS — J189 Pneumonia, unspecified organism: Secondary | ICD-10-CM | POA: Diagnosis not present

## 2020-03-10 DIAGNOSIS — Z515 Encounter for palliative care: Secondary | ICD-10-CM | POA: Diagnosis not present

## 2020-03-10 LAB — GLUCOSE, CAPILLARY: Glucose-Capillary: 160 mg/dL — ABNORMAL HIGH (ref 70–99)

## 2020-03-10 MED ORDER — LORAZEPAM 2 MG/ML IJ SOLN
0.5000 mg | INTRAMUSCULAR | Status: DC | PRN
  Administered 2020-03-10 – 2020-03-13 (×2): 1 mg via INTRAVENOUS
  Filled 2020-03-10 (×2): qty 1

## 2020-03-10 MED ORDER — HYDROMORPHONE HCL 1 MG/ML IJ SOLN
0.5000 mg | INTRAMUSCULAR | Status: DC | PRN
  Administered 2020-03-10 (×2): 0.5 mg via INTRAVENOUS
  Filled 2020-03-10 (×2): qty 0.5

## 2020-03-10 MED ORDER — HYDROMORPHONE HCL 1 MG/ML IJ SOLN
0.5000 mg | INTRAMUSCULAR | Status: DC | PRN
  Administered 2020-03-11 – 2020-03-12 (×6): 0.5 mg via INTRAVENOUS
  Administered 2020-03-12: 1 mg via INTRAVENOUS
  Administered 2020-03-12 – 2020-03-13 (×3): 0.5 mg via INTRAVENOUS
  Filled 2020-03-10: qty 1
  Filled 2020-03-10 (×8): qty 0.5
  Filled 2020-03-10: qty 1
  Filled 2020-03-10 (×2): qty 0.5

## 2020-03-10 MED ORDER — LORAZEPAM 1 MG PO TABS
1.0000 mg | ORAL_TABLET | ORAL | Status: DC | PRN
  Administered 2020-03-12 (×2): 1 mg via ORAL
  Filled 2020-03-10 (×2): qty 1

## 2020-03-10 MED ORDER — MORPHINE SULFATE 10 MG/5ML PO SOLN
2.5000 mg | ORAL | Status: DC | PRN
  Administered 2020-03-10 – 2020-03-13 (×7): 2.5 mg via ORAL
  Filled 2020-03-10 (×7): qty 2

## 2020-03-10 NOTE — Progress Notes (Addendum)
Manufacturing engineer [facility]  Referral received for hospice services at home versus [facility].  Met with pt at the bedside, no family present.    Pt states she would like to go to [facility] for EOL care.    Her husband is coming to terms with her decisions about her heathcare.  Their children are in support of her decision, but her husband of 27 years is taking some time to understand the trajectory of her illness.  This is causing increased stress on Mrs. Mateo Flow as well.  She does tell me that he seems to be understanding of her wishes.  Mr. Sanjuan wants her to come home with him to have visitors be able to spend time with her. This is the same reason that Mrs. Draughon would like to go to [facility] to avoid excessive visitors. She is tearful at times.  Advised her I will have to see if she is eligible for residential hospice services and as of today, we do not have a bed that is open.  [facility] will check in tomorrow to see if the plan is still for residential hospice or home.    Venia Carbon RN, BSN, [facility] Liaison

## 2020-03-10 NOTE — Progress Notes (Signed)
[facility] PROGRESS NOTE    Progress Note  Natasha [name]  ZOX:[id number] DOB: 03-03-1946 DOA: 02/27/2020 PCP: [name], MD     Brief Narrative:   Natasha [name] is an 74 y.o. female past medical history metastatic lung cancer, paroxysmal atrial fibrillation on Eliquis congestive heart failure history of DVT and PE on Eliquis end-stage renal disease Monday Wednesday and Friday seizure disorder, with recent admission for left-sided pneumothorax on 02/28/2020 post bronchoscopy with status post chest tube placement and discharged home on 03/03/2020 on 3 L of oxygen palliative care was consulted at that time comes into the hospital for worsening shortness of breath.  She relates that started the day prior to admission she is also been having chest pain body aches and lower extremity swelling accompanied by palpitations.  She had her dialysis the day prior to admission.  In the ED she was found tachypneic tachycardic requiring 4 L of oxygen with leukocytosis of 11 platelet count of 19, BNP of 446 troponins of 83 CT angio of the chest showed no PE but it did show multifocal pneumonia SARS-CoV-2 and influenza PCR  03/09/2020: Patient seen.  Input from the palliative care team and oncology team is highly appreciated.  Goal of care has been addressed.  Patient is hospice appropriate.  Patient continues to report problems with coughing.  Oncology team has advised continuing dexamethasone for now.  Apparently, patient had significant headaches without dexamethasone.  As per the palliative care team, patient's husband wants patient to continue antibiotics over the weekend. 03/10/2020: Patient seen alongside patient's daughter.  Goal of care isolating to was comfort directed measures.  No hemodialysis done today.  Palliative care input is appreciated.  Assessment/Plan:   Present on admission Sepsis due to multifocal pneumonia/acute respiratory failure on chronic: Patient presented with  worsening shortness of breath requiring 4 L of oxygen to keep saturations greater than 90%. CT angio of the chest was personally reviewed showed no PE, but it did show bilateral lower lobe infiltrates concerning for pneumonia.  A meter left upper lobe mass and large amounts of subcutaneous emphysema over the left chest wall She was started on IV vancomycin and cefepime on admission he has remained afebrile since then her tachycardia and tachypnea have improved. She still has a persistent leukocytosis 03/09/2020: We will complete the course of antibiotics as requested by patient's husband.  ESRD on dialysis [facility]): High anion gap metabolic acidosis, nephrology was consulted Alysis today 03/08/2020. 03/09/2020: Further management will depend on the goal of care.  We will go ahead and discontinue tramadol, considering end-stage renal disease to avoid opioid-induced neurotoxicity.  Better options in the setting of renal impairment include methadone, fentanyl or Dilaudid.  Percocet, hydrocodone may be acceptable as well.  Is also noted that the palliative care team may have prescribed morphine.  We will also hold morphine unless comfort directed care has been confirmed.  We will go ahead and discontinue morphine for now. 03/10/2020: No hemodialysis done today as patient is leaning towards comfort directed care.  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 03/10/2020: Leaning towards comfort directed care  Thrombocytopenia: No signs of bleeding.  Paroxysmal atrial fibrillation with RVR: She was continue on Eliquis started on a Cardizem drip improved will transition to oral Cardizem the RVR is probably sepsis driven. Her heart rate has been less than 100 persistently, will transition IV diltiazem to oral diltiazem. Eliquis.  Hypothyroidism: Continue Synthroid.    Metastatic lung carcinoma,  left [facility])     DVT prophylaxis: Eliquis Family Communication:Husband Status  is: Inpatient  Remains inpatient appropriate because:Hemodynamically unstable   Dispo: The patient is from: Home              Anticipated d/c is to: Home              Anticipated d/c date is: > 3 days              Patient currently is not medically stable to d/c.        Code Status:     Code Status Orders  (From admission, onward)         Start     Ordered   03/16/2020 1526  Do not attempt resuscitation (DNR)  Continuous       Question Answer Comment  In the event of cardiac or respiratory ARREST Do not call a "code blue"   In the event of cardiac or respiratory ARREST Do not perform Intubation, CPR, defibrillation or ACLS   In the event of cardiac or respiratory ARREST Use medication by any route, position, wound care, and other measures to relive pain and suffering. May use oxygen, suction and manual treatment of airway obstruction as needed for comfort.      02/28/2020 1527        Code Status History    Date Active Date Inactive Code Status Order ID Comments User Context   03/02/2020 1143 03/03/2020 2238 Partial Code [id number]  Natasha Rumpf, NP Inpatient   02/29/2020 1928 03/02/2020 1142 Partial Code [id number]  Natasha Bruins, MD ED   02/15/2020 1350 02/21/2020 2240 DNR [id number]  Natasha Jewel, MD ED   01/30/2020 2318 02/03/2020 2347 Full Code [id number]  Natasha Patience, MD Inpatient   11/18/2019 1414 11/21/2019 0010 Full Code [id number]  Natasha Morton, MD ED   04/13/2019 1637 04/16/2019 2127 Full Code [id number]  Natasha Morton, MD ED   05/28/2018 1827 06/06/2018 1658 Full Code [id number]  Natasha Border, MD ED   03/31/2018 0512 04/02/2018 2218 Full Code [id number]  Natasha Bulls, MD ED   03/20/2017 0129 03/28/2017 0051 Full Code [id number]  Natasha Grout, MD Inpatient   11/18/2016 1434 11/21/2016 1827 Full Code [id number]  Natasha Dickens, MD ED   07/12/2016 0137 07/15/2016 1647 Full Code [id number]  Natasha Kocher, MD ED   10/12/2015 2313 10/15/2015 1657 Full Code  [id number]  Natasha Baker, MD Inpatient   09/09/2014 1339 09/14/2014 2240 Full Code [id number]  Natasha Sells, MD Inpatient   03/29/2014 2231 04/07/2014 1453 Full Code [id number]  Natasha Howells, MD ED   05/12/2013 2132 05/18/2013 1605 Full Code [id number]  Natasha Mull, MD Inpatient   Advance Care Planning Activity        IV Access:    Peripheral IV   Procedures and diagnostic studies:   No results found.   Medical Consultants:    None.  Anti-Infectives:   vanc and cefepime  Subjective:    Natasha [name] she relates her breathing is not better today she still feels tired fatigue and anorexic  Objective:    Vitals:   03/09/20 1700 03/09/20 2200 03/10/20 0833 03/10/20 1103  BP: 113/62 119/61 136/64   Pulse: 72     Resp: 13 18    Temp:  98 F (36.7 C)    TempSrc:  Oral    SpO2: 96% 97%  99%  Weight:      Height:  SpO2: 99 % O2 Flow Rate (L/min): 5 L/min   Intake/Output Summary (Last 24 hours) at 03/10/2020 1315 Last data filed at 03/09/2020 2124 Gross per 24 hour  Intake 725.14 ml  Output --  Net 725.14 ml   Filed Weights   03/08/20 0259 03/08/20 1340 03/09/20 0400  Weight: 53.1 kg 52.7 kg 51.4 kg    Exam: General exam: In no acute distress. Respiratory system: Decreased air entry globally.  Diffuse crackles.  Cardiovascular system: S1 & S2 heard, RRR. No JVD. Gastrointestinal system: Abdomen is nondistended, soft and nontender.  Extremities: Bilateral leg edema, 1+ to 2. Neuro: Patient is awake and alert.  Patient moves all extremities.    Data Reviewed:    Labs: Basic Metabolic Panel: Recent Labs  Lab 03/05/2020 0929 03/16/2020 0929 03/01/2020 1339 03/24/2020 1343 03/08/20 0255  NA 140  --   --  138 141  K 4.1   < >  --  4.0 5.0  CL 100  --   --   --  104  CO2 22  --   --   --  18*  GLUCOSE 89  --   --   --  64*  BUN 71*  --   --   --  86*  CREATININE 7.03*  --   --   --  7.95*  CALCIUM 8.2*  --   --   --  7.6*  MG  --    --  1.5*  --   --   PHOS  --   --  5.3*  --   --    < > = values in this interval not displayed.   GFR Estimated Creatinine Clearance: 5.1 mL/min (A) (by C-G formula based on SCr of 7.95 mg/dL (H)). Liver Function Tests: Recent Labs  Lab 03/08/20 0255  AST 27  ALT 20  ALKPHOS 62  BILITOT 0.9  PROT 4.6*  ALBUMIN 1.7*   No results for input(s): LIPASE, AMYLASE in the last 168 hours. No results for input(s): AMMONIA in the last 168 hours. Coagulation profile No results for input(s): INR, PROTIME in the last 168 hours. COVID-19 Labs  No results for input(s): DDIMER, FERRITIN, LDH, CRP in the last 72 hours.  Lab Results  Component Value Date   SARSCOV2NAA NEGATIVE 02/25/2020   SARSCOV2NAA NEGATIVE 02/29/2020   [place] NEGATIVE 02/25/2020   [place] NEGATIVE 02/15/2020    CBC: Recent Labs  Lab 02/28/2020 0929 03/05/2020 1343 03/08/20 0255 03/08/20 2015  WBC 11.5*  --  13.4*  --   NEUTROABS 9.8*  --  11.2*  --   HGB 8.5* 7.1* 7.0* 8.7*  HCT 28.3* 21.0* 22.8* 27.0*  MCV 100.0  --  100.4*  --   PLT 90*  --  64*  --    Cardiac Enzymes: No results for input(s): CKTOTAL, CKMB, CKMBINDEX, TROPONINI in the last 168 hours. BNP (last 3 results) No results for input(s): PROBNP in the last 8760 hours. CBG: No results for input(s): GLUCAP in the last 168 hours. D-Dimer: No results for input(s): DDIMER in the last 72 hours. Hgb A1c: No results for input(s): HGBA1C in the last 72 hours. Lipid Profile: No results for input(s): CHOL, HDL, LDLCALC, TRIG, CHOLHDL, LDLDIRECT in the last 72 hours. Thyroid function studies: No results for input(s): TSH, T4TOTAL, T3FREE, THYROIDAB in the last 72 hours.  Invalid input(s): FREET3 Anemia work up: No results for input(s): VITAMINB12, FOLATE, FERRITIN, TIBC, IRON, RETICCTPCT in the last 72 hours. Sepsis Labs:  Recent Labs  Lab 03/17/2020 0929 03/08/20 0255  WBC 11.5* 13.4*   Microbiology Recent Results (from the past 240  hour(s))  Respiratory Panel by RT PCR (Flu A&B, Covid) - Nasopharyngeal Swab     Status: None   Collection Time: 02/29/20  4:19 PM   Specimen: Nasopharyngeal Swab  Result Value Ref Range Status   SARS Coronavirus 2 by RT PCR NEGATIVE NEGATIVE Final    Comment: (NOTE) SARS-CoV-2 target nucleic acids are NOT DETECTED.  The SARS-CoV-2 RNA is generally detectable in upper respiratoy specimens during the acute phase of infection. The lowest concentration of SARS-CoV-2 viral copies this assay can detect is 131 copies/mL. A negative result does not preclude SARS-Cov-2 infection and should not be used as the sole basis for treatment or other patient management decisions. A negative result may occur with  improper specimen collection/handling, submission of specimen other than nasopharyngeal swab, presence of viral mutation(s) within the areas targeted by this assay, and inadequate number of viral copies (<131 copies/mL). A negative result must be combined with clinical observations, patient history, and epidemiological information. The expected result is Negative.  Fact Sheet for Patients:  PinkCheek.be  Fact Sheet for Healthcare Providers:  GravelBags.it  This test is no t yet approved or cleared by the [place] FDA and  has been authorized for detection and/or diagnosis of SARS-CoV-2 by FDA under an Emergency Use Authorization (EUA). This EUA will remain  in effect (meaning this test can be used) for the duration of the COVID-19 declaration under Section 564(b)(1) of the Act, 21 U.S.C. section 360bbb-3(b)(1), unless the authorization is terminated or revoked sooner.     Influenza A by PCR NEGATIVE NEGATIVE Final   Influenza B by PCR NEGATIVE NEGATIVE Final    Comment: (NOTE) The Xpert Xpress SARS-CoV-2/FLU/RSV assay is intended as an aid in  the diagnosis of influenza from Nasopharyngeal swab specimens and  should not  be used as a sole basis for treatment. Nasal washings and  aspirates are unacceptable for Xpert Xpress SARS-CoV-2/FLU/RSV  testing.  Fact Sheet for Patients: PinkCheek.be  Fact Sheet for Healthcare Providers: GravelBags.it  This test is not yet approved or cleared by the [place] FDA and  has been authorized for detection and/or diagnosis of SARS-CoV-2 by  FDA under an Emergency Use Authorization (EUA). This EUA will remain  in effect (meaning this test can be used) for the duration of the  Covid-19 declaration under Section 564(b)(1) of the Act, 21  U.S.C. section 360bbb-3(b)(1), unless the authorization is  terminated or revoked. Performed at [facility], [place] [address] [place], [place] [address]   Respiratory Panel by RT PCR (Flu A&B, Covid) - Nasopharyngeal Swab     Status: None   Collection Time: 03/15/2020  2:14 PM   Specimen: Nasopharyngeal Swab  Result Value Ref Range Status   SARS Coronavirus 2 by RT PCR NEGATIVE NEGATIVE Final    Comment: (NOTE) SARS-CoV-2 target nucleic acids are NOT DETECTED.  The SARS-CoV-2 RNA is generally detectable in upper respiratoy specimens during the acute phase of infection. The lowest concentration of SARS-CoV-2 viral copies this assay can detect is 131 copies/mL. A negative result does not preclude SARS-Cov-2 infection and should not be used as the sole basis for treatment or other patient management decisions. A negative result may occur with  improper specimen collection/handling, submission of specimen other than nasopharyngeal swab, presence of viral mutation(s) within the areas targeted by this assay, and inadequate number of viral  copies (<131 copies/mL). A negative result must be combined with clinical observations, patient history, and epidemiological information. The expected result is Negative.  Fact Sheet for Patients:    PinkCheek.be  Fact Sheet for Healthcare Providers:  GravelBags.it  This test is no t yet approved or cleared by the [place] FDA and  has been authorized for detection and/or diagnosis of SARS-CoV-2 by FDA under an Emergency Use Authorization (EUA). This EUA will remain  in effect (meaning this test can be used) for the duration of the COVID-19 declaration under Section 564(b)(1) of the Act, 21 U.S.C. section 360bbb-3(b)(1), unless the authorization is terminated or revoked sooner.     Influenza A by PCR NEGATIVE NEGATIVE Final   Influenza B by PCR NEGATIVE NEGATIVE Final    Comment: (NOTE) The Xpert Xpress SARS-CoV-2/FLU/RSV assay is intended as an aid in  the diagnosis of influenza from Nasopharyngeal swab specimens and  should not be used as a sole basis for treatment. Nasal washings and  aspirates are unacceptable for Xpert Xpress SARS-CoV-2/FLU/RSV  testing.  Fact Sheet for Patients: PinkCheek.be  Fact Sheet for Healthcare Providers: GravelBags.it  This test is not yet approved or cleared by the [place] FDA and  has been authorized for detection and/or diagnosis of SARS-CoV-2 by  FDA under an Emergency Use Authorization (EUA). This EUA will remain  in effect (meaning this test can be used) for the duration of the  Covid-19 declaration under Section 564(b)(1) of the Act, 21  U.S.C. section 360bbb-3(b)(1), unless the authorization is  terminated or revoked. Performed at [facility], [place] [address] [place], [place] [address]   Culture, blood (routine x 2) Call MD if unable to obtain prior to antibiotics being given     Status: None (Preliminary result)   Collection Time: 03/06/2020  6:27 PM   Specimen: BLOOD RIGHT FOREARM  Result Value Ref Range Status   Specimen Description BLOOD RIGHT FOREARM  Final   Special Requests   Final    BOTTLES  DRAWN AEROBIC AND ANAEROBIC Blood Culture results may not be optimal due to an inadequate volume of blood received in culture bottles   Culture   Final    NO GROWTH 3 DAYS Performed at [facility], [place] [address], [place], [place] [address]    Report Status PENDING  Incomplete     Medications:   . sodium chloride   Intravenous Once  . apixaban  2.5 mg Oral BID  . calcitRIOL  0.25 mcg Oral Q M,W,F-HD  . Chlorhexidine Gluconate Cloth  6 each Topical Q0600  . dexamethasone  4 mg Oral TID  . diltiazem  60 mg Oral Q6H  . fluticasone furoate-vilanterol  1 puff Inhalation Daily   And  . umeclidinium bromide  1 puff Inhalation Daily  . levothyroxine  125 mcg Oral QAC breakfast  . midodrine  10 mg Oral TID WC  . pantoprazole  40 mg Oral BID  . vancomycin variable dose per unstable renal function (pharmacist dosing)   Does not apply See admin instructions   Continuous Infusions: . sodium chloride 250 mL (03/09/20 2123)  . ceFEPime (MAXIPIME) IV Stopped (03/10/20 6153)   Time spent: 25 minutes.   LOS: 3 days   [facility]  [facility]  03/10/2020, 1:15 PM

## 2020-03-10 NOTE — Progress Notes (Signed)
Palliative Medicine Inpatient Follow Up Note  Reason for consult:  Goals of Care  HPI:  Per intake H&P --> Natasha [name] an 74 y.o.femalepast medical history metastatic lung cancer, paroxysmal atrial fibrillation on Eliquis congestive heart failure history of DVT and PE on Eliquis end-stage renal disease Monday Wednesday and Friday seizure disorder, with recent admission for left-sided pneumothorax on 02/28/2020 post bronchoscopy with status post chest tube placement and discharged home on 03/03/2020 on 3 L of oxygen palliative care was consulted at that time comes into the hospital for worsening shortness of breath. She relates that started the day prior to admission she is also been having chest pain body aches and lower extremity swelling accompanied by palpitations. She had her dialysis the day prior to admission. In the ED she was found tachypneic tachycardic requiring 4 L of oxygen with leukocytosis of 11 platelet count of 19, BNP of 446 troponins of 83 CT angio of the chest showed no PE but it did show multifocal pneumonia SARS-CoV-2 and influenza PCR.  [place] he is well-known to the palliative care service she has been seen multiple times over the last 1 month.  She was discharged just last week from [facility].  At that time she and her husband wanted to continue with outpatient dialysis and were not at a point where they were ready to resign to hospice care.  It appears that over the last week she had worsening of symptoms and has been admitted for a multifocal pneumonia.  We plan to further discuss potential hospice care assuming that the patient is now interested in this modality of care.  Today's Discussion (03/10/2020): Chart reviewed. Patient complains of some dyspnea this morning. I touched base with her bedside RN, Jodell Cipro who was able to provide her some dilaudid and a as needed breathing treatment.  Patient afterwards vocalized some relief.  She was offered Ativan  though declined this as her granddaughter was visiting.  Mrs. Geraldine [name] and myself were able to meet this morning to further discuss plans towards comfort care.  The big question at this moment in time is whether or not patient will go home with hospice or whether or not she will transition to a residential hospice.  The patient expressed to me after her husband left that she feels it may be most beneficial for her to transition to [facility] as this would enable her to get comprehensive symptom related care.  I told Mrs. Ungar I would reach out to the hospice liaison to place her name on the wait list for [facility] which she was in full agreement with.  Discussed the importance of continued conversation with family and their  medical providers regarding overall plan of care and treatment options, ensuring decisions are within the context of the patients values and GOCs.  Questions and concerns addressed   Objective Assessment: Vital Signs Vitals:   03/10/20 0833 03/10/20 1103  BP: 136/64   Pulse:    Resp:    Temp:    SpO2:  99%    Intake/Output Summary (Last 24 hours) at 03/10/2020 1149 Last data filed at 03/09/2020 2124 Gross per 24 hour  Intake 725.14 ml  Output --  Net 725.14 ml   Last Weight  Most recent update: 03/09/2020  5:01 AM   Weight  51.4 kg (113 lb 5.1 oz)           SUMMARY OF RECOMMENDATIONS DNAR/DNI  Patients family will be visiting throughout  the weekend. She will transition to full comfort care on Monday  [facility] consult for referral to [facility] if patient does not elect to go home with hospice care  [place]  Time Spent: 25 Greater than 50% of the time was spent in counseling and coordination of care ______________________________________________________________________________________ [place] Team Team Cell Phone: [phone] Please utilize secure chat  with additional questions, if there is no response within 30 minutes please call the above phone number  Palliative Medicine Team providers are available by phone from 7am to 7pm daily and can be reached through the team cell phone.  Should this patient require assistance outside of these hours, please call the patient's attending physician.

## 2020-03-10 NOTE — Progress Notes (Signed)
Patient transitioning to hospice care.  I agree with this plan and will therefore sign off patient

## 2020-03-11 DIAGNOSIS — Z7189 Other specified counseling: Secondary | ICD-10-CM | POA: Diagnosis not present

## 2020-03-11 DIAGNOSIS — Z515 Encounter for palliative care: Secondary | ICD-10-CM | POA: Diagnosis not present

## 2020-03-11 DIAGNOSIS — J189 Pneumonia, unspecified organism: Secondary | ICD-10-CM | POA: Diagnosis not present

## 2020-03-11 NOTE — Progress Notes (Signed)
Palliative Medicine Inpatient Follow Up Note  Reason for consult:  Goals of Care  HPI:  Per intake H&P --> Natasha M Franklinis an 74 y.o.femalepast medical history metastatic lung cancer, paroxysmal atrial fibrillation on Eliquis congestive heart failure history of DVT and PE on Eliquis end-stage renal disease Monday Wednesday and Friday seizure disorder, with recent admission for left-sided pneumothorax on 02/28/2020 post bronchoscopy with status post chest tube placement and discharged home on 03/03/2020 on 3 L of oxygen palliative care was consulted at that time comes into the hospital for worsening shortness of breath. She relates that started the day prior to admission she is also been having chest pain body aches and lower extremity swelling accompanied by palpitations. She had her dialysis the day prior to admission. In the ED she was found tachypneic tachycardic requiring 4 L of oxygen with leukocytosis of 11 platelet count of 19, BNP of 446 troponins of 83 CT angio of the chest showed no PE but it did show multifocal pneumonia SARS-CoV-2 and influenza PCR.  All he is well-known to the palliative care service she has been seen multiple times over the last 1 month.  She was discharged just last week from [facility].  At that time she and her husband wanted to continue with outpatient dialysis and were not at a point where they were ready to resign to hospice care.  It appears that over the last week she had worsening of symptoms and has been admitted for a multifocal pneumonia.  We plan to further discuss potential hospice care assuming that the patient is now interested in this modality of care.  Today's Discussion (03/11/2020): Chart reviewed.  In the last 24 hours patient received 2 doses of Dilaudid intravenously, 1 dose of oral morphine, and 1 dose of IV Ativan.  Upon assessment this morning Natasha [name] states that her symptoms were much better controlled yesterday.  She  said this morning upon awaking she is feeling short of breath, and requests that we provide some medication.  I was able to speak with patient's bedside nurse Vinnie Level, who will provide some opioid medication this morning to aid in dyspnea relief.  [facility] is involved to help with placement at [facility].  As of yesterday there were no beds available.  Discussed the importance of continued conversation with family and their  medical providers regarding overall plan of care and treatment options, ensuring decisions are within the context of the patients values and GOCs.  Questions and concerns addressed   Objective Assessment: Vital Signs Vitals:   03/11/20 0000 03/11/20 0238  BP: 122/64 (!) 145/61  Pulse:  75  Resp: 16   Temp: 98.1 F (36.7 C) 98.1 F (36.7 C)  SpO2:      Intake/Output Summary (Last 24 hours) at 03/11/2020 0735 Last data filed at 03/11/2020 0400 Gross per 24 hour  Intake 476.73 ml  Output --  Net 476.73 ml   Last Weight  Most recent update: 03/11/2020  2:39 AM   Weight  54.1 kg (119 lb 4.3 oz)           SUMMARY OF RECOMMENDATIONS DNAR/DNI  Patients family will be visiting throughout the weekend  Transition to full comfort care on Monday  [facility] consult for referral to [facility] --> no beds available  Spiritual support - [facility]  Time Spent: 15 Greater than 50% of the time was spent in counseling and coordination of care ______________________________________________________________________________________ [place] Team Team  Cell Phone: [phone] Please utilize secure chat with additional questions, if there is no response within 30 minutes please call the above phone number  Palliative Medicine Team providers are available by phone from 7am to 7pm daily and can be reached through the team cell phone.  Should this patient require assistance outside of these hours, please call the  patient's attending physician.

## 2020-03-11 NOTE — Progress Notes (Signed)
[facility] PROGRESS NOTE    Progress Note  Natasha [name]  ERX:[id number] DOB: July 23, 1945 DOA: 03/22/2020 PCP: [name], MD     Brief Narrative:   Patient is a 74 year old African-American female with past medical history significant for metastatic lung cancer, paroxysmal atrial fibrillation on Eliquis, congestive heart failure, history of DVT and PE on Eliquis, end-stage renal disease on hemodialysis on Monday, Wednesday and Friday; seizure disorder, recent admission for left-sided pneumothorax on 02/28/2020 post bronchoscopy, status post chest tube placement and discharged home on 03/03/2020 on 3 L of oxygen.  Palliative care was consulted at that time.  Patient was readmitted with worsening shortness of breath.  According to the patient, symptoms (shortness of breath) started the day prior to admission, with associated chest pain, body aches and lower extremity swelling accompanied by palpitations.  She had her dialysis the day prior to admission.  In the ED she was found to be tachypneic, tachycardic, requiring 4 L of oxygen with leukocytosis of 11 and platelet count of 19, BNP of 446, troponins of 83.  CT angio of the chest was negative for pulmonary embolism, but did show multifocal pneumonia.  SARS-CoV-2 and influenza PCR came back negative.  03/11/2020: Patient was started on IV vancomycin and cefepime for pneumonia.  Oncology team, palliative care team and nephrology teams were consulted to assist with patient's management.  Patient was initially dialyzed.  After extensive discussion, the goal of care is basically comfort directed care at the moment.  Patient is awaiting to be discharged to [facility] when a bed becomes available.  Hospitalist team has also come to see the patient.    Assessment/Plan:   Present on admission Sepsis due to multifocal pneumonia/acute respiratory failure on chronic: -Patient presented with worsening shortness of breath requiring 4 L of  oxygen to keep saturations greater than 90%. -CT angio of the chest was personally reviewed showed no PE, but it did show bilateral lower lobe infiltrates concerning for pneumonia.  A meter left upper lobe mass and large amounts of subcutaneous emphysema over the left chest wall -Patient was started on IV vancomycin and cefepime on admission for multifocal pneumonia.   -After extensive discussion, comfort directed care has been elected.   -Patient will be discharged to hospice house when a bed becomes available.    ESRD on dialysis [facility]): -Based on the goal of care, hemodialysis has been discontinued. -With discontinuation of hemodialysis, life expectancy is on the average 2 weeks.  However, patient has seemed to have significant pain. -Goal remains to keep patient comfortable.  Adequate analgesia.  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 03/11/2020: Comfort directed care.  Thrombocytopenia: No signs of bleeding.  Paroxysmal atrial fibrillation with RVR: She was continue on Eliquis started on a Cardizem drip improved will transition to oral Cardizem the RVR is probably sepsis driven. Her heart rate has been less than 100 persistently, will transition IV diltiazem to oral diltiazem. Eliquis. 03/11/2020: Awaiting discharge to hospice house.  Hypothyroidism: -Awaiting discharge to hospice house.      Metastatic lung carcinoma, left (HCC)     DVT prophylaxis: Eliquis Family Communication:Husband Status is: Inpatient  Remains inpatient appropriate because:Hemodynamically unstable   Dispo: The patient is from: Home              Anticipated d/c is to: Hospice house ([facility])              Anticipated d/c date is: Patient is  awaiting bed availability.                Code Status:     Code Status Orders  (From admission, onward)         Start     Ordered   03/21/2020 1526  Do not attempt resuscitation (DNR)  Continuous       Question Answer  Comment  In the event of cardiac or respiratory ARREST Do not call a "code blue"   In the event of cardiac or respiratory ARREST Do not perform Intubation, CPR, defibrillation or ACLS   In the event of cardiac or respiratory ARREST Use medication by any route, position, wound care, and other measures to relive pain and suffering. May use oxygen, suction and manual treatment of airway obstruction as needed for comfort.      03/04/2020 1527        Code Status History    Date Active Date Inactive Code Status Order ID Comments User Context   03/02/2020 1143 03/03/2020 2238 Partial Code [id number]  Rosezella Rumpf, NP Inpatient   02/29/2020 1928 03/02/2020 1142 Partial Code [id number]  Marcelyn Bruins, MD ED   02/15/2020 1350 02/21/2020 2240 DNR [id number]  Mckinley Jewel, MD ED   01/30/2020 2318 02/03/2020 2347 Full Code [id number]  Rise Patience, MD Inpatient   11/18/2019 1414 11/21/2019 0010 Full Code [id number]  Norval Morton, MD ED   04/13/2019 1637 04/16/2019 2127 Full Code [id number]  Norval Morton, MD ED   05/28/2018 1827 06/06/2018 1658 Full Code [id number]  Merton Border, MD ED   03/31/2018 0512 04/02/2018 2218 Full Code [id number]  Vianne Bulls, MD ED   03/20/2017 0129 03/28/2017 0051 Full Code [id number]  Phillips Grout, MD Inpatient   11/18/2016 1434 11/21/2016 1827 Full Code [id number]  Waldemar Dickens, MD ED   07/12/2016 0137 07/15/2016 1647 Full Code [id number]  Lily Kocher, MD ED   10/12/2015 2313 10/15/2015 1657 Full Code [id number]  Toy Baker, MD Inpatient   09/09/2014 1339 09/14/2014 2240 Full Code [id number]  Nita Sells, MD Inpatient   03/29/2014 2231 04/07/2014 1453 Full Code [id number]  Shanda Howells, MD ED   05/12/2013 2132 05/18/2013 1605 Full Code [id number]  Berle Mull, MD Inpatient   Advance Care Planning Activity        IV Access:    Peripheral IV   Procedures and diagnostic studies:   No results found.   Medical Consultants:     None.  Anti-Infectives:   vanc and cefepime  Subjective:  -No new complaints. -Patient is comfortable.  Objective:    Vitals:   03/11/20 1100 03/11/20 1200 03/11/20 1331 03/11/20 1351  BP:  (!) 133/92  (!) 133/92  Pulse:  70 71   Resp:  14 14   Temp: 98 F (36.7 C)     TempSrc: Oral     SpO2:   98%   Weight:      Height:       SpO2: 98 % O2 Flow Rate (L/min): 5 L/min   Intake/Output Summary (Last 24 hours) at 03/11/2020 1524 Last data filed at 03/11/2020 0800 Gross per 24 hour  Intake 476.73 ml  Output --  Net 476.73 ml   Filed Weights   03/08/20 1340 03/09/20 0400 03/11/20 0238  Weight: 52.7 kg 51.4 kg 54.1 kg    Exam: General exam: In no acute distress. Respiratory system: Decreased air entry globally.  Diffuse crackles.  Cardiovascular system: S1 &  S2 heard, RRR. No JVD. Gastrointestinal system: Abdomen is nondistended, soft and nontender.  Extremities: Bilateral leg edema, 1+ to 2. Neuro: Patient is awake and alert.  Patient moves all extremities.    Data Reviewed:    Labs: Basic Metabolic Panel: Recent Labs  Lab 03/11/2020 0929 03/06/2020 0929 03/09/2020 1339 03/04/2020 1343 03/08/20 0255  NA 140  --   --  138 141  K 4.1   < >  --  4.0 5.0  CL 100  --   --   --  104  CO2 22  --   --   --  18*  GLUCOSE 89  --   --   --  64*  BUN 71*  --   --   --  86*  CREATININE 7.03*  --   --   --  7.95*  CALCIUM 8.2*  --   --   --  7.6*  MG  --   --  1.5*  --   --   PHOS  --   --  5.3*  --   --    < > = values in this interval not displayed.   GFR Estimated Creatinine Clearance: 5.4 mL/min (A) (by C-G formula based on SCr of 7.95 mg/dL (H)). Liver Function Tests: Recent Labs  Lab 03/08/20 0255  AST 27  ALT 20  ALKPHOS 62  BILITOT 0.9  PROT 4.6*  ALBUMIN 1.7*   No results for input(s): LIPASE, AMYLASE in the last 168 hours. No results for input(s): AMMONIA in the last 168 hours. Coagulation profile No results for input(s): INR, PROTIME in the  last 168 hours. COVID-19 Labs  No results for input(s): DDIMER, FERRITIN, LDH, CRP in the last 72 hours.  Lab Results  Component Value Date   SARSCOV2NAA NEGATIVE 03/21/2020   SARSCOV2NAA NEGATIVE 02/29/2020   [place] NEGATIVE 02/25/2020   [place] NEGATIVE 02/15/2020    CBC: Recent Labs  Lab 03/24/2020 0929 02/28/2020 1343 03/08/20 0255 03/08/20 2015  WBC 11.5*  --  13.4*  --   NEUTROABS 9.8*  --  11.2*  --   HGB 8.5* 7.1* 7.0* 8.7*  HCT 28.3* 21.0* 22.8* 27.0*  MCV 100.0  --  100.4*  --   PLT 90*  --  64*  --    Cardiac Enzymes: No results for input(s): CKTOTAL, CKMB, CKMBINDEX, TROPONINI in the last 168 hours. BNP (last 3 results) No results for input(s): PROBNP in the last 8760 hours. CBG: Recent Labs  Lab 03/10/20 1624  GLUCAP 160*   D-Dimer: No results for input(s): DDIMER in the last 72 hours. Hgb A1c: No results for input(s): HGBA1C in the last 72 hours. Lipid Profile: No results for input(s): CHOL, HDL, LDLCALC, TRIG, CHOLHDL, LDLDIRECT in the last 72 hours. Thyroid function studies: No results for input(s): TSH, T4TOTAL, T3FREE, THYROIDAB in the last 72 hours.  Invalid input(s): FREET3 Anemia work up: No results for input(s): VITAMINB12, FOLATE, FERRITIN, TIBC, IRON, RETICCTPCT in the last 72 hours. Sepsis Labs: Recent Labs  Lab 02/29/2020 0929 03/08/20 0255  WBC 11.5* 13.4*   Microbiology Recent Results (from the past 240 hour(s))  Respiratory Panel by RT PCR (Flu A&B, Covid) - Nasopharyngeal Swab     Status: None   Collection Time: 03/08/2020  2:14 PM   Specimen: Nasopharyngeal Swab  Result Value Ref Range Status   SARS Coronavirus 2 by RT PCR NEGATIVE NEGATIVE Final    Comment: (NOTE) SARS-CoV-2 target nucleic acids are NOT DETECTED.  The SARS-CoV-2 RNA is generally detectable in upper respiratoy specimens during the acute phase of infection. The lowest concentration of SARS-CoV-2 viral copies this assay can detect is 131 copies/mL. A  negative result does not preclude SARS-Cov-2 infection and should not be used as the sole basis for treatment or other patient management decisions. A negative result may occur with  improper specimen collection/handling, submission of specimen other than nasopharyngeal swab, presence of viral mutation(s) within the areas targeted by this assay, and inadequate number of viral copies (<131 copies/mL). A negative result must be combined with clinical observations, patient history, and epidemiological information. The expected result is Negative.  Fact Sheet for Patients:  PinkCheek.be  Fact Sheet for Healthcare Providers:  GravelBags.it  This test is no t yet approved or cleared by the [place] FDA and  has been authorized for detection and/or diagnosis of SARS-CoV-2 by FDA under an Emergency Use Authorization (EUA). This EUA will remain  in effect (meaning this test can be used) for the duration of the COVID-19 declaration under Section 564(b)(1) of the Act, 21 U.S.C. section 360bbb-3(b)(1), unless the authorization is terminated or revoked sooner.     Influenza A by PCR NEGATIVE NEGATIVE Final   Influenza B by PCR NEGATIVE NEGATIVE Final    Comment: (NOTE) The Xpert Xpress SARS-CoV-2/FLU/RSV assay is intended as an aid in  the diagnosis of influenza from Nasopharyngeal swab specimens and  should not be used as a sole basis for treatment. Nasal washings and  aspirates are unacceptable for Xpert Xpress SARS-CoV-2/FLU/RSV  testing.  Fact Sheet for Patients: PinkCheek.be  Fact Sheet for Healthcare Providers: GravelBags.it  This test is not yet approved or cleared by the [place] FDA and  has been authorized for detection and/or diagnosis of SARS-CoV-2 by  FDA under an Emergency Use Authorization (EUA). This EUA will remain  in effect (meaning this test can  be used) for the duration of the  Covid-19 declaration under Section 564(b)(1) of the Act, 21  U.S.C. section 360bbb-3(b)(1), unless the authorization is  terminated or revoked. Performed at [facility], [place] [address] [place], [place] [address]   Culture, blood (routine x 2) Call MD if unable to obtain prior to antibiotics being given     Status: None (Preliminary result)   Collection Time: 03/23/2020  6:27 PM   Specimen: BLOOD RIGHT FOREARM  Result Value Ref Range Status   Specimen Description BLOOD RIGHT FOREARM  Final   Special Requests   Final    BOTTLES DRAWN AEROBIC AND ANAEROBIC Blood Culture results may not be optimal due to an inadequate volume of blood received in culture bottles   Culture   Final    NO GROWTH 4 DAYS Performed at [facility], [place] [address] [place], [place] [address]    Report Status PENDING  Incomplete     Medications:   . sodium chloride   Intravenous Once  . apixaban  2.5 mg Oral BID  . calcitRIOL  0.25 mcg Oral Q M,W,F-HD  . Chlorhexidine Gluconate Cloth  6 each Topical Q0600  . dexamethasone  4 mg Oral TID  . diltiazem  60 mg Oral Q6H  . fluticasone furoate-vilanterol  1 puff Inhalation Daily   And  . umeclidinium bromide  1 puff Inhalation Daily  . levothyroxine  125 mcg Oral QAC breakfast  . midodrine  10 mg Oral TID WC  . pantoprazole  40 mg Oral BID  . vancomycin variable dose per  unstable renal function (pharmacist dosing)   Does not apply See admin instructions   Continuous Infusions: . sodium chloride 0 mL/hr at 03/11/20 0800  . ceFEPime (MAXIPIME) IV 200 mL/hr at 03/11/20 0800   Time spent: 25 minutes.   LOS: 4 days   [place] [facility]  [facility]  03/11/2020, 3:24 PM

## 2020-03-11 NOTE — Progress Notes (Signed)
[facility] ([facility]) Hospital Liaison note.    Unfortunately, [facility] is unable to offer a room today. Hospital Liaison will follow up tomorrow or sooner if a room becomes available.    Please do not hesitate to call with questions.  Thank you for the opportunity to participate in this patient's care.  Chrislyn King, BSN, RN [facility] Hospital Liaison (listed on [facility] under Hospice/[facility])    [phone]     

## 2020-03-11 NOTE — Plan of Care (Signed)

## 2020-03-12 ENCOUNTER — Encounter: Payer: Self-pay | Admitting: *Deleted

## 2020-03-12 DIAGNOSIS — C7802 Secondary malignant neoplasm of left lung: Secondary | ICD-10-CM | POA: Diagnosis not present

## 2020-03-12 DIAGNOSIS — Z515 Encounter for palliative care: Secondary | ICD-10-CM | POA: Diagnosis not present

## 2020-03-12 DIAGNOSIS — R0609 Other forms of dyspnea: Secondary | ICD-10-CM | POA: Diagnosis not present

## 2020-03-12 DIAGNOSIS — J189 Pneumonia, unspecified organism: Secondary | ICD-10-CM | POA: Diagnosis not present

## 2020-03-12 LAB — CULTURE, BLOOD (ROUTINE X 2): Culture: NO GROWTH

## 2020-03-12 NOTE — Care Management Important Message (Signed)
Important Message  Patient Details  Name: Natasha [name] MRN: [id number] Date of Birth: 02-Dec-1945   Medicare Important Message Given:  Yes     Priscila, Bean 03/12/2020, 3:28 PM

## 2020-03-12 NOTE — Progress Notes (Signed)
[facility] ([facility]) Hospital Liaison note.    Unfortunately, [facility] is unable to offer a room today. Hospital Liaison will follow up tomorrow or sooner if a room becomes available.    Please do not hesitate to call with questions.  Thank you for the opportunity to participate in this patient's care.  Chrislyn King, BSN, RN [facility] Hospital Liaison (listed on [facility] under Hospice/[facility])    [phone]     

## 2020-03-12 NOTE — Plan of Care (Signed)
  Problem: Clinical Measurements: Goal: Respiratory complications will improve Outcome: Progressing   Problem: Coping: Goal: Level of anxiety will decrease Outcome: Progressing   

## 2020-03-12 NOTE — Progress Notes (Signed)
Patient ID: Natasha [name], female   DOB: [birth year], 74 y.o.   MRN: [id number]  This NP visited patient at the bedside as a follow up for palliative medicine needs and emotional support  is a 74 year old female with a past medical history of seizure, PVD, PAF previously on Eliquis, congestive heart failure, hypertension, COPD, history of DVT, ESRD on IHD Monday Wednesday Friday.  She originally had a CT scan in June 2021 which revealed a left lower lobe mass.  She declined intervention or work-up at that time.  She had a repeat PET scan in June 2021 which showed a left-sided mass 2.3 x 1.9 cm with metastatic osseous lesions.  She went for a biopsy of the spine.  We try to get her in for bronchoscopy but declined.  She does have severe COPD at baseline with an FEV1 of 36% predicted.  The T10 lesion that was biopsied by our our revealed atypical lymphoid infiltrates but no carcinoma.  She declined bronchoscopy by Dr. Lamonte Sakai.  Patient presented back to the emergency department in September 2021 with neurologic changes.  She had imaging of the brain which revealed metastasis.    Today Natasha [name] is lethargic and dyspneic intermittently.  As needed symptom mediations are ordered.  Education offered to bedside RN in utilization of meds for symptom management enhancing comfort for this patient.    Initial consult completed by PMT on 03/09/20.  Focus of care is comfort and dignitiy.  No further work-up, no further dialysis.    Prognosis is less than two weeks, patient awaits bed at [facility].  Husband at bedside, continued conversation regarding current medical situation. Education offered on the difference between an aggressive medical intervention path and a palliative comfort path.   Education regarding the natural trajectory and expectations at end of life was offered.  Will make further changes to plan of care, shifting to a full comfort path.  Comfort and dignity are focus of care -no further  diagnostics -dc cardiac monitoring -no IV medications -await bed at [facility] -prognosis is likely days to week   Questions and concerns addressed   Discussed with Dr Marthenia Rolling  Total time spent on the unit was 35 minutes  Greater than 50% of the time was spent in counseling and coordination of care  Wadie Lessen NP  Palliative Medicine Team Team Phone # [phone] Pager [phone]

## 2020-03-12 NOTE — Progress Notes (Signed)
[facility] PROGRESS NOTE    Progress Note  Natasha [name]  FIE:[id number] DOB: Apr 20, 1946 DOA: 03/17/2020 PCP: [name], MD     Brief Narrative:   Patient is a 74 year old African-American female with past medical history significant for metastatic lung cancer, paroxysmal atrial fibrillation on Eliquis, congestive heart failure, history of DVT and PE on Eliquis, end-stage renal disease on hemodialysis on Monday, Wednesday and Friday; seizure disorder, recent admission for left-sided pneumothorax on 02/28/2020 post bronchoscopy, status post chest tube placement and discharged home on 03/03/2020 on 3 L of oxygen.  Palliative care was consulted at that time.  Patient was readmitted with worsening shortness of breath.  According to the patient, symptoms (shortness of breath) started the day prior to admission, with associated chest pain, body aches and lower extremity swelling accompanied by palpitations.  She had her dialysis the day prior to admission.  In the ED she was found to be tachypneic, tachycardic, requiring 4 L of oxygen with leukocytosis of 11 and platelet count of 19, BNP of 446, troponins of 83.  CT angio of the chest was negative for pulmonary embolism, but did show multifocal pneumonia.  SARS-CoV-2 and influenza PCR came back negative.  03/11/2020: Patient was started on IV vancomycin and cefepime for pneumonia.  Oncology team, palliative care team and nephrology teams were consulted to assist with patient's management.  Patient was initially dialyzed.  After extensive discussion, the goal of care is basically comfort directed care at the moment.  Patient is awaiting to be discharged to [facility] when a bed becomes available.  Hospitalist team has also come to see the patient.    03/12/2020: Patient is awaiting a bed at [facility].  Patient is dying actively.  Continue comfort directed measures.   Assessment/Plan:   Present on admission Sepsis due to  multifocal pneumonia/acute respiratory failure on chronic: -Patient presented with worsening shortness of breath requiring 4 L of oxygen to keep saturations greater than 90%. -CT angio of the chest was personally reviewed showed no PE, but it did show bilateral lower lobe infiltrates concerning for pneumonia.  A meter left upper lobe mass and large amounts of subcutaneous emphysema over the left chest wall -Patient was started on IV vancomycin and cefepime on admission for multifocal pneumonia.   -After extensive discussion, comfort directed care has been elected.   -Patient will be discharged to hospice house ([facility]) when a bed becomes available.  Patient is dying actively.  ESRD on dialysis [facility]): -Based on the goal of care, hemodialysis has been discontinued. -With discontinuation of hemodialysis, life expectancy is on the average 2 weeks.  However, patient has seemed to have significant pain. -Goal remains to keep patient comfortable.  Adequate analgesia.  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 03/12/2020: Comfort directed care.  Paroxysmal atrial fibrillation with RVR: She was continue on Eliquis started on a Cardizem drip improved will transition to oral Cardizem the RVR is probably sepsis driven. Her heart rate has been less than 100 persistently, will transition IV diltiazem to oral diltiazem. Eliquis. 03/12/2020: Awaiting discharge to hospice house.      Metastatic lung carcinoma, left (HCC)     DVT prophylaxis: Eliquis Family Communication:  Status is: Inpatient  Remains inpatient appropriate because:Hemodynamically unstable   Dispo: The patient is from: Home              Anticipated d/c is to: Hospice house ([facility])  Anticipated d/c date is: Patient is awaiting bed availability.                Code Status:     Code Status Orders  (From admission, onward)         Start     Ordered   03/04/2020 1526  Do not  attempt resuscitation (DNR)  Continuous       Question Answer Comment  In the event of cardiac or respiratory ARREST Do not call a "code blue"   In the event of cardiac or respiratory ARREST Do not perform Intubation, CPR, defibrillation or ACLS   In the event of cardiac or respiratory ARREST Use medication by any route, position, wound care, and other measures to relive pain and suffering. May use oxygen, suction and manual treatment of airway obstruction as needed for comfort.      03/10/2020 1527        Code Status History    Date Active Date Inactive Code Status Order ID Comments User Context   03/02/2020 1143 03/03/2020 2238 Partial Code [id number]  Rosezella Rumpf, NP Inpatient   02/29/2020 1928 03/02/2020 1142 Partial Code [id number]  Marcelyn Bruins, MD ED   02/15/2020 1350 02/21/2020 2240 DNR [id number]  Mckinley Jewel, MD ED   01/30/2020 2318 02/03/2020 2347 Full Code [id number]  Rise Patience, MD Inpatient   11/18/2019 1414 11/21/2019 0010 Full Code [id number]  Norval Morton, MD ED   04/13/2019 1637 04/16/2019 2127 Full Code [id number]  Norval Morton, MD ED   05/28/2018 1827 06/06/2018 1658 Full Code [id number]  Merton Border, MD ED   03/31/2018 0512 04/02/2018 2218 Full Code [id number]  Vianne Bulls, MD ED   03/20/2017 0129 03/28/2017 0051 Full Code [id number]  Phillips Grout, MD Inpatient   11/18/2016 1434 11/21/2016 1827 Full Code [id number]  Waldemar Dickens, MD ED   07/12/2016 0137 07/15/2016 1647 Full Code [id number]  Lily Kocher, MD ED   10/12/2015 2313 10/15/2015 1657 Full Code [id number]  Toy Baker, MD Inpatient   09/09/2014 1339 09/14/2014 2240 Full Code [id number]  Nita Sells, MD Inpatient   03/29/2014 2231 04/07/2014 1453 Full Code [id number]  Shanda Howells, MD ED   05/12/2013 2132 05/18/2013 1605 Full Code [id number]  Berle Mull, MD Inpatient   Advance Care Planning Activity        IV Access:    Peripheral IV   Procedures and diagnostic studies:    No results found.   Medical Consultants:    None.  Anti-Infectives:   vanc and cefepime  Subjective:  -No new complaints. -Patient is comfortable.  Objective:    Vitals:   03/12/20 0254 03/12/20 0400 03/12/20 0800 03/12/20 1200  BP: 130/62 (!) 104/59 (!) 142/51 (!) 129/56  Pulse: 63 60 65   Resp:  14 14 14   Temp:  97.8 F (36.6 C)  97.9 F (36.6 C)  TempSrc:  Oral  Oral  SpO2:  100% 100% 100%  Weight:      Height:       SpO2: 100 % O2 Flow Rate (L/min): 4 L/min   Intake/Output Summary (Last 24 hours) at 03/12/2020 1825 Last data filed at 03/12/2020 1500 Gross per 24 hour  Intake 602.4 ml  Output 51 ml  Net 551.4 ml   Filed Weights   03/09/20 0400 03/11/20 0238 03/12/20 0038  Weight: 51.4 kg 54.1 kg 54 kg    Exam: General exam: In no acute distress.  Patient is  dying actively. Respiratory system: Decreased air entry globally.   Cardiovascular system: S1 & S2 heard Gastrointestinal system: Abdomen is nondistended, soft and nontender.  Extremities: Bilateral leg edema, 1+ to 2.  Data Reviewed:    Labs: Basic Metabolic Panel: Recent Labs  Lab 03/15/2020 0929 03/10/2020 0929 02/29/2020 1339 03/06/2020 1343 03/08/20 0255  NA 140  --   --  138 141  K 4.1   < >  --  4.0 5.0  CL 100  --   --   --  104  CO2 22  --   --   --  18*  GLUCOSE 89  --   --   --  64*  BUN 71*  --   --   --  86*  CREATININE 7.03*  --   --   --  7.95*  CALCIUM 8.2*  --   --   --  7.6*  MG  --   --  1.5*  --   --   PHOS  --   --  5.3*  --   --    < > = values in this interval not displayed.   GFR Estimated Creatinine Clearance: 5.4 mL/min (A) (by C-G formula based on SCr of 7.95 mg/dL (H)). Liver Function Tests: Recent Labs  Lab 03/08/20 0255  AST 27  ALT 20  ALKPHOS 62  BILITOT 0.9  PROT 4.6*  ALBUMIN 1.7*   No results for input(s): LIPASE, AMYLASE in the last 168 hours. No results for input(s): AMMONIA in the last 168 hours. Coagulation profile No results for  input(s): INR, PROTIME in the last 168 hours. COVID-19 Labs  No results for input(s): DDIMER, FERRITIN, LDH, CRP in the last 72 hours.  Lab Results  Component Value Date   SARSCOV2NAA NEGATIVE 02/25/2020   SARSCOV2NAA NEGATIVE 02/29/2020   [place] NEGATIVE 02/25/2020   [place] NEGATIVE 02/15/2020    CBC: Recent Labs  Lab 02/27/2020 0929 03/08/2020 1343 03/08/20 0255 03/08/20 2015  WBC 11.5*  --  13.4*  --   NEUTROABS 9.8*  --  11.2*  --   HGB 8.5* 7.1* 7.0* 8.7*  HCT 28.3* 21.0* 22.8* 27.0*  MCV 100.0  --  100.4*  --   PLT 90*  --  64*  --    Cardiac Enzymes: No results for input(s): CKTOTAL, CKMB, CKMBINDEX, TROPONINI in the last 168 hours. BNP (last 3 results) No results for input(s): PROBNP in the last 8760 hours. CBG: Recent Labs  Lab 03/10/20 1624  GLUCAP 160*   D-Dimer: No results for input(s): DDIMER in the last 72 hours. Hgb A1c: No results for input(s): HGBA1C in the last 72 hours. Lipid Profile: No results for input(s): CHOL, HDL, LDLCALC, TRIG, CHOLHDL, LDLDIRECT in the last 72 hours. Thyroid function studies: No results for input(s): TSH, T4TOTAL, T3FREE, THYROIDAB in the last 72 hours.  Invalid input(s): FREET3 Anemia work up: No results for input(s): VITAMINB12, FOLATE, FERRITIN, TIBC, IRON, RETICCTPCT in the last 72 hours. Sepsis Labs: Recent Labs  Lab 03/01/2020 0929 03/08/20 0255  WBC 11.5* 13.4*   Microbiology Recent Results (from the past 240 hour(s))  Respiratory Panel by RT PCR (Flu A&B, Covid) - Nasopharyngeal Swab     Status: None   Collection Time: 03/22/2020  2:14 PM   Specimen: Nasopharyngeal Swab  Result Value Ref Range Status   SARS Coronavirus 2 by RT PCR NEGATIVE NEGATIVE Final    Comment: (NOTE) SARS-CoV-2 target nucleic acids are NOT DETECTED.  The SARS-CoV-2  RNA is generally detectable in upper respiratoy specimens during the acute phase of infection. The lowest concentration of SARS-CoV-2 viral copies this assay can  detect is 131 copies/mL. A negative result does not preclude SARS-Cov-2 infection and should not be used as the sole basis for treatment or other patient management decisions. A negative result may occur with  improper specimen collection/handling, submission of specimen other than nasopharyngeal swab, presence of viral mutation(s) within the areas targeted by this assay, and inadequate number of viral copies (<131 copies/mL). A negative result must be combined with clinical observations, patient history, and epidemiological information. The expected result is Negative.  Fact Sheet for Patients:  PinkCheek.be  Fact Sheet for Healthcare Providers:  GravelBags.it  This test is no t yet approved or cleared by the [place] FDA and  has been authorized for detection and/or diagnosis of SARS-CoV-2 by FDA under an Emergency Use Authorization (EUA). This EUA will remain  in effect (meaning this test can be used) for the duration of the COVID-19 declaration under Section 564(b)(1) of the Act, 21 U.S.C. section 360bbb-3(b)(1), unless the authorization is terminated or revoked sooner.     Influenza A by PCR NEGATIVE NEGATIVE Final   Influenza B by PCR NEGATIVE NEGATIVE Final    Comment: (NOTE) The Xpert Xpress SARS-CoV-2/FLU/RSV assay is intended as an aid in  the diagnosis of influenza from Nasopharyngeal swab specimens and  should not be used as a sole basis for treatment. Nasal washings and  aspirates are unacceptable for Xpert Xpress SARS-CoV-2/FLU/RSV  testing.  Fact Sheet for Patients: PinkCheek.be  Fact Sheet for Healthcare Providers: GravelBags.it  This test is not yet approved or cleared by the [place] FDA and  has been authorized for detection and/or diagnosis of SARS-CoV-2 by  FDA under an Emergency Use Authorization (EUA). This EUA will remain  in  effect (meaning this test can be used) for the duration of the  Covid-19 declaration under Section 564(b)(1) of the Act, 21  U.S.C. section 360bbb-3(b)(1), unless the authorization is  terminated or revoked. Performed at [facility], [place] [address], [place], [place] [address]   Culture, blood (routine x 2) Call MD if unable to obtain prior to antibiotics being given     Status: None   Collection Time: 03/21/2020  6:27 PM   Specimen: BLOOD RIGHT FOREARM  Result Value Ref Range Status   Specimen Description BLOOD RIGHT FOREARM  Final   Special Requests   Final    BOTTLES DRAWN AEROBIC AND ANAEROBIC Blood Culture results may not be optimal due to an inadequate volume of blood received in culture bottles   Culture   Final    NO GROWTH 5 DAYS Performed at [facility], [place] [address], [place], [place] [address]    Report Status 03/12/2020 FINAL  Final     Medications:   . sodium chloride   Intravenous Once  . Chlorhexidine Gluconate Cloth  6 each Topical Q0600  . vancomycin variable dose per unstable renal function (pharmacist dosing)   Does not apply See admin instructions   Continuous Infusions: . sodium chloride Stopped (03/12/20 0255)   Time spent: 25 minutes.   LOS: 5 days   [facility]  [facility]  03/12/2020, 6:25 PM

## 2020-03-12 NOTE — Progress Notes (Signed)
I followed up on Natasha [name]'s hospital notes.  She is going to [facility] with comfort measures only.  I called radiology to cancel her ct scan.  I will update oncology team.

## 2020-03-12 NOTE — Progress Notes (Signed)
Nutrition Brief Note  Chart reviewed. Pt now transitioning to comfort care.  No further nutrition interventions warranted at this time.  Please re-consult as needed.   Marianita Botkin W, RD, LDN, CDCES Registered Dietitian II Certified Diabetes Care and Education Specialist Please refer to [facility] for RD and/or RD on-call/weekend/after hours pager  

## 2020-03-13 DIAGNOSIS — J189 Pneumonia, unspecified organism: Secondary | ICD-10-CM

## 2020-03-13 MED ORDER — HYDROMORPHONE HCL 1 MG/ML IJ SOLN
1.0000 mg | INTRAMUSCULAR | Status: DC | PRN
  Administered 2020-03-13: 1 mg via INTRAVENOUS
  Filled 2020-03-13 (×3): qty 1

## 2020-03-13 MED ORDER — GLYCOPYRROLATE 0.2 MG/ML IJ SOLN
0.2000 mg | INTRAMUSCULAR | Status: DC | PRN
  Administered 2020-03-13: 0.2 mg via INTRAVENOUS
  Filled 2020-03-13: qty 1

## 2020-03-13 MED ORDER — ONDANSETRON HCL 4 MG/2ML IJ SOLN: 4.0000 mg | Freq: Four times a day (QID) | INTRAMUSCULAR | Status: DC | PRN

## 2020-03-20 ENCOUNTER — Ambulatory Visit: Payer: Medicare Other | Admitting: Internal Medicine

## 2020-03-20 ENCOUNTER — Other Ambulatory Visit: Payer: Medicare Other

## 2020-03-21 ENCOUNTER — Encounter (HOSPITAL_COMMUNITY): Payer: Self-pay

## 2020-03-22 ENCOUNTER — Ambulatory Visit (HOSPITAL_COMMUNITY): Payer: Medicare Other

## 2020-03-26 ENCOUNTER — Ambulatory Visit: Payer: Medicare Other | Admitting: Internal Medicine

## 2020-03-26 NOTE — Progress Notes (Addendum)
827: Pt seemed uncomfortable; Morphine given; Had a large Leveda Anna; Paged MD to make aware; No new orders placed due to pt being comfort care; Also requested something for nausea; MD ordered Zofran; Will continue to monitor pt.    930: Husband, [name], stopped to visit.    1225: Pt appears to be uncomfortable; now with excessive secretion; RR >36; Spoke with Hospice RN regarding what I should do next since this pt is waiting for a bed at [facility]; Hospice RN stated Robinul would benefit this pt and continue to alternate Morphine and Dilaudid for pain, discomfort and air hunger;  Morphine given. Received orders for Robinul as well. Will continue to monitor.   [id number]: Pt transitioned. MD notified. Husband at bedside, other family in route. Chaplain consulted STAT for family. Bath completed. [employer] contacted. REFERENCE [id number]; Further follow up was required and reference number [phone] was given. Pt placement notified. Called husband regarding funeral home and at this time family had not decided home; between [place] or another unknown name funeral home  Post Mortem charting completed. Will continue to assist family.

## 2020-03-26 NOTE — Progress Notes (Signed)
This chaplain responded to the spiritual care consult for EOL care.  The chaplain learned from the Pt. RN-Kiera, the Pt. passed at 1:46pm.  The RN and chaplain agreed to page the chaplain as family arrived.    The RN  informed the chaplain upon F/U some family has visited and unsure of the status of others. The RN will check and update spiritual care as needed.

## 2020-03-26 NOTE — Death Summary Note (Signed)
DEATH SUMMARY   Patient Details  Name: Natasha [name] MRN: [id number] DOB: [birth year]  Admission/Discharge Information   Admit Date:  Mar 16, 2020  Date of Death: Date of Death: 2020-03-22  Time of Death: Time of Death: 0146  Length of Stay: 6  Referring Physician: [name], MD   Reason(s) for Hospitalization   Sepsis due to multifocal pneumonia  Diagnoses  Preliminary cause of death:  Secondary Diagnoses (including complications and co-morbidities):  Principal Problem:   Multifocal pneumonia Active Problems:   ESRD on dialysis [name])   Hypothyroidism   Dyspnea   Emphysema/COPD ([place])   Thrombocytopenia ([place])   Acute on chronic respiratory failure with hypoxia ([place])   Metastatic lung carcinoma, left (HCC)   End of life care   Elevated troponin   PAF (paroxysmal atrial fibrillation) ([place])   Sepsis [facility])   Brief Hospital Course (including significant findings, care, treatment, and services provided and events leading to death)   Brief summary: Natasha [name] is a 74 y.o. year old female with past medical history of metastatic lung cancer, PAF on Eliquis, history of DVT/PE, diastolic heart failure, ESRD on HD, recent admission for left-sided pneumothorax requiring chest tube placement-on 3 L of oxygen at home-presented to the ED with chest pain, myalgias-found to have multifocal pneumonia on CT imaging-she was subsequently admitted to the hospitalist service.  Hospital course by problem list Sepsis secondary to multifocal pneumonia: Admitted and started on broad-spectrum IV antibiotics-blood cultures negative, COVID-19 PCR was negative.  Palliative care was involved-after extensive discussion-patient was transitioned to comfort measures.  Plans were to transfer to [place] patient expired on March 23, 2023 at 0146.    Acute on chronic hypoxic respiratory failure: Was recently discharged on 3 L of O2 at home-following the pneumothorax-worsening hypoxia was  likely secondary to multifocal pneumonia.  ESRD: On HD-once patient elected to transition to comfort care-HD was no longer pursued.  PAF with RVR: Briefly required a Cardizem drip-subsequently transition to oral Cardizem-was on Eliquis.  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  Anemia likely secondary to ESRD and underlying malignancy  Thrombocytopenia: Most likely secondary to sepsis    Pertinent Labs and Studies  Significant Diagnostic Studies DG Chest 2 View  Result Date: 02/28/2020 CLINICAL DATA:  Dyspnea, productive cough, pre-bronchoscopy EXAM: CHEST - 2 VIEW COMPARISON:  01/30/2020 chest radiograph. FINDINGS: Vascular stent overlies the left axilla. Stable cardiomediastinal silhouette with top-normal heart size. No pneumothorax. No pleural effusion. Left perihilar 2.7 cm nodule is unchanged. No overt pulmonary edema. IMPRESSION: Stable left perihilar 2.7 cm nodule. No acute cardiopulmonary disease. Electronically Signed   By: Ilona Sorrel M.D.   On: 02/28/2020 09:05   CT Head Wo Contrast  Result Date: 02/15/2020 CLINICAL DATA:  Headache.  Lung cancer. EXAM: CT HEAD WITHOUT CONTRAST TECHNIQUE: Contiguous axial images were obtained from the base of the skull through the vertex without intravenous contrast. COMPARISON:  MRI 12/01/2019 FINDINGS: Brain: There is an area of low-density noted in the right parietal subcortical white matter, new since prior MRI. Given history of lung cancer, cannot exclude metastasis in this region. No hemorrhage or hydrocephalus. Vascular: No hyperdense vessel or unexpected calcification. Skull: No acute calvarial abnormality. Sinuses/Orbits: Visualized paranasal sinuses and mastoids clear. Orbital soft tissues unremarkable. Other: None  IMPRESSION: Area of subcortical low-density in the right parietal lobe. Cannot exclude focal  lesion/metastasis. Consider further evaluation with MRI with and without contrast. Electronically Signed   By: Rolm Baptise M.D.   On: 02/15/2020 11:42   CT CHEST WO CONTRAST  Result Date: 02/29/2020 CLINICAL DATA:  Pneumothorax EXAM: CT CHEST WITHOUT CONTRAST TECHNIQUE: Multidetector CT imaging of the chest was performed following the standard protocol without IV contrast. COMPARISON:  Radiograph same day FINDINGS: Cardiovascular: Aortic atherosclerosis is noted. There are coronary artery calcifications present. The heart size is normal. There is no pericardial thickening or effusion. A left axillary probable subclavian stent is seen. Mediastinum/Nodes: There are no enlarged mediastinal, hilar or axillary lymph nodes. The thyroid gland, trachea and esophagus demonstrate no significant findings. Lungs/Pleura: There is a small to moderate left-sided pneumothorax. A anterior left-sided pigtail catheter is seen with the tip projecting at the left lung base there is a surrounding area of consolidation/contusion around the pleural catheter at the base. Again noted is an anterior left upper lobe 3.5 cm pulmonary mass as on the prior exam. There are 2 stable spiculated nodular opacity seen within the left upper lobe the largest measuring 7 mm, series 6, image 65. A stable 6 mm pulmonary nodule seen in the posterior right upper lung. Extensive centrilobular emphysematous changes are seen. No midline shift is noted. Upper abdomen: Bilateral renal atrophy with scattered calcifications are seen. Musculoskeletal/Chest wall: There is no chest wall mass or suspicious osseous finding. No acute osseous abnormality small amount of subcutaneous emphysema seen along the anterior chest wall. IMPRESSION: 1. Small to moderate left-sided pneumothorax with a anterior left-sided pigtail catheter. The tip projecting at the lung base with  surrounding pulmonary contusion/atelectasis. 2. Stable stable 3.5 cm pulmonary mass in the anterior left upper lobe. 3. Stable bilateral subcentimeter pulmonary nodules. 4.  Emphysema (ICD10-J43.9). 5.  Aortic Atherosclerosis (ICD10-I70.0). Electronically Signed   By: Prudencio Pair M.D.   On: 02/29/2020 19:14   CT Angio Chest PE W/Cm &/Or Wo Cm  Result Date: 03/24/2020 CLINICAL DATA:  Shortness of breath. EXAM: CT ANGIOGRAPHY CHEST WITH CONTRAST TECHNIQUE: Multidetector CT imaging of the chest was performed using the standard protocol during bolus administration of intravenous contrast. Multiplanar CT image reconstructions and MIPs were obtained to evaluate the vascular anatomy. CONTRAST:  24m OMNIPAQUE IOHEXOL 350 MG/ML SOLN COMPARISON:  February 29, 2020. FINDINGS: Cardiovascular: Satisfactory opacification of the pulmonary arteries to the segmental level. No evidence of pulmonary embolism. Normal heart size. No pericardial effusion. Atherosclerosis of thoracic aorta is noted without aneurysm or dissection. Mediastinum/Nodes: Status post thyroidectomy. Esophagus is unremarkable. No adenopathy is noted. Lungs/Pleura: No pneumothorax or pleural effusion is noted. Bilateral lower lobe and right upper lobe opacities are noted concerning for multifocal pneumonia. 2.9 cm left upper lobe mass is again noted concerning for malignancy. Upper Abdomen: No acute abnormality. Musculoskeletal: No osseous abnormality is noted. Large amount of subcutaneous emphysema is seen over the left chest wall and supraclavicular region. Review of the MIP images confirms the above findings. IMPRESSION: 1. No definite evidence of pulmonary embolus. 2. Bilateral lower lobe and right upper lobe opacities are noted concerning for multifocal pneumonia. 3. 2.9 cm left upper lobe mass is noted concerning for malignancy. 4. Large amount of subcutaneous emphysema is seen over the left chest wall and supraclavicular region. 5. Aortic  atherosclerosis. Aortic Atherosclerosis (ICD10-I70.0). Electronically Signed   By: JMarijo ConceptionM.D.   On: 03/02/2020 12:52   MR BRAIN WO CONTRAST  Result Date: 02/29/2020 CLINICAL DATA:  CNS staging. Repeat due to excessively motion  degraded postcontrast imaging to be used for treatment purposes EXAM: MRI HEAD WITHOUT CONTRAST TECHNIQUE: Multiplanar, multiecho pulse sequences of the brain and surrounding structures were obtained without intravenous contrast. COMPARISON:  02/20/2020 FINDINGS: Very motion degraded study, even more than the inadequate prior. The patient was sedated and was sleeping during much exam, which accentuated the motion. Even when aroused, motion with limiting factor. The technologist used maximal head stabilizers to no benefit. Given the degree of motion and the patient's end-stage renal disease status, additional gadolinium exposure was deemed to be of no justified benefit. Known metastatic disease with nonprogressive scattered areas of vasogenic edema in the left frontal, left frontal parietal, right parietal, and right anterior temporal regions. No acute infarct, hydrocephalus, or acute hemorrhage. IMPRESSION: Motion degraded study, even worse than prior, such that the study was truncated before contrast administration. The patient's sedatives paradoxically diminished her ability to remain still. Electronically Signed   By: Monte Fantasia M.D.   On: 02/29/2020 09:55   MR BRAIN WO CONTRAST  Result Date: 02/16/2020 CLINICAL DATA:  Initial evaluation for possible brain mass. EXAM: MRI HEAD WITHOUT CONTRAST TECHNIQUE: Multiplanar, multiecho pulse sequences of the brain and surrounding structures were obtained without intravenous contrast. COMPARISON:  Comparison made with prior CT from earlier same day as well as recent brain MRI from 12/01/2019. FINDINGS: Brain: There has been interval development of multifocal areas of T2/FLAIR hyperintensity involving both cerebral and  cerebellar hemispheres, consistent with vasogenic edema related to new metastatic disease. These foci are predominantly positioned along the gray-white matter differentiation. Largest area within the left cerebral hemisphere seen at the anterior left frontal lobe and measures 18 mm (series 11, image 17). Largest area within the right cerebral hemisphere seen at the right temporal occipital junction and measures 2.3 cm (series 11, image 12). Largest infratentorial area seen within r the right cerebellum and measures 1.7 cm. No significant regional mass effect about any of these foci at this time. There are at least 10 lesions in total. Evaluation for underlying lesion size limited given lack of IV contrast, although 1 lesion positioned at the subcortical right parietal lobe measures 5 mm, measured well on T2 weighted sequence (series 10, image 16). Few scattered foci of susceptibility artifacts seen associated with these lesions, consistent with necrosis and/or hemorrhage. Underlying atrophy with chronic microvascular ischemic disease again noted. No evidence for acute or subacute infarct. Gray-white matter differentiation otherwise maintained. Ventricles within normal limits without hydrocephalus. No extra-axial fluid collection. Pituitary gland and suprasellar region within normal limits. Midline structures intact. Vascular: Major intracranial vascular flow voids are maintained. Skull and upper cervical spine: Craniocervical junction within normal limits. Bone marrow signal intensity normal. No focal marrow replacing lesion. No scalp soft tissue abnormality. Sinuses/Orbits: Patient status post bilateral ocular lens replacement. Paranasal sinuses are largely clear. No significant mastoid effusion. Other: None. IMPRESSION: Interval development of multifocal areas of T2/FLAIR hyperintensity involving both cerebral and cerebellar hemispheres, consistent with vasogenic edema related to new metastatic disease. No  significant regional mass effect at this time. Follow-up examination with postcontrast imaging could be performed for complete assessment as clinically warranted. Please note that the risk of NSF is considered to be negligible with the use of group II agents (Gadavist). Electronically Signed   By: Jeannine Boga M.D.   On: 02/16/2020 01:05   MR BRAIN W WO CONTRAST  Result Date: 02/20/2020 CLINICAL DATA:  Metastatic disease. Staging. Patient with known diagnosis of lung cancer. EXAM: MRI HEAD WITHOUT AND WITH  CONTRAST TECHNIQUE: Multiplanar, multiecho pulse sequences of the brain and surrounding structures were obtained without and with intravenous contrast. CONTRAST:  57m GADAVIST GADOBUTROL 1 MMOL/ML IV SOLN COMPARISON:  MRI 02/15/2020 FINDINGS: Brain: Multiple peripherally enhancing metastatic lesions on series 9, including: *6 mm lesion in the left frontal lobe (image 21). *6 mm lesion in the high left parietal lobe (image 118). *2 mm lesion in the high left frontal lobe (image 112). *6 mm lesion in the right parieto-occipital region (image 95). *Punctate lesion in the posterior left temporal lobe (image 84). *7 mm lesion in the posterior right temporal lobe (image 76) with additional tiny adjacent lesion (image 72). *Approximately 7 mm lesion in the anterior right temporal lobe (image 51) with limited evaluation given motion. *5 mm lesion in the right cerebellar hemisphere (image 35) *4 mm lesion in the cerebellar vermis (image 56) with likely additional punctate additional lesion (image 60). These lesions have associated mild surrounding vasogenic edema without substantial mass effect. No midline shift. Basal cisterns are patent. Some of these lesions demonstrate mild susceptibility artifact, compatible with internal hemorrhage. Otherwise, no acute hemorrhage. No acute infarct. Mild cerebral atrophy with ex vacuo ventricular dilation. No hydrocephalus. Vascular: Major arterial flow voids are  maintained at the skull base. Skull and upper cervical spine: Normal marrow signal. Sinuses/Orbits: Negative. Other: No mastoid effusion. IMPRESSION: Numerous infratentorial and supratentorial enhancing metastatic lesions, as detailed above. Mild associated vasogenic edema without substantial mass effect. Electronically Signed   By: FMargaretha SheffieldMD   On: 02/20/2020 09:16   DG Chest Portable 1 View  Result Date: 03/23/2020 CLINICAL DATA:  Chest pain, shortness of breath. EXAM: PORTABLE CHEST 1 VIEW COMPARISON:  March 03, 2020. FINDINGS: Stable cardiomediastinal silhouette. Minimal left apical pneumothorax is noted. Stable left perihilar mass is noted increased right basilar atelectasis or infiltrate is noted. Stable left basilar atelectasis or infiltrate is noted. Small pleural effusions may be present. Bony thorax is unremarkable. Subcutaneous emphysema is seen over the left lateral chest wall and left supraclavicular region. IMPRESSION: Minimal left apical pneumothorax is noted. Stable left perihilar mass. Increased right basilar atelectasis or infiltrate is noted. Stable left basilar atelectasis or infiltrate is noted. Small pleural effusions may be present. Electronically Signed   By: JMarijo ConceptionM.D.   On: 02/29/2020 09:31   DG Chest Port 1 View  Result Date: 03/03/2020 CLINICAL DATA:  Pneumothorax. EXAM: PORTABLE CHEST 1 VIEW COMPARISON:  03/02/2020; 03/01/2020; 02/29/2020; chest CT-02/29/2020 FINDINGS: Grossly unchanged cardiac silhouette and mediastinal contours. Interval reduction in persistent tiny left apical pneumothorax. The amount of right lateral chest wall subcutaneous emphysema has increased in the interval. Trace left-sided effusion with associated left basilar heterogeneous/consolidative opacities. The lungs remain hyperexpanded with diffuse slightly nodular thickening the pulmonary interstitium. Vascular stents overlie the medial aspect the left upper arm. No acute osseous  abnormalities. Surgical clips overlie the thoracic inlet. IMPRESSION: 1. Interval reduction in persistent tiny left apical pneumothorax. 2. Interval increase in amount of left lateral chest wall subcutaneous emphysema. 3. Trace left-sided effusion with associated left basilar opacities, atelectasis versus infiltrate. Electronically Signed   By: JSandi MariscalM.D.   On: 03/03/2020 14:05   DG Chest Port 1 View  Result Date: 03/02/2020 CLINICAL DATA:  74year old female with pneumothorax. EXAM: PORTABLE CHEST 1 VIEW COMPARISON:  Chest radiograph dated 03/03/2019. FINDINGS: There has been interval removal of the left chest tube and increase in the size of left apical pneumothorax compared to the prior radiograph. The left  apical pneumothorax measures approximately 2.5 cm to the apical pleural surface. Left perihilar mass measuring approximately 2.9 x 3.2 cm as previously noted. There is a small left pleural effusion. Left lung base atelectasis and less likely infiltrate. Stable cardiac silhouette. Atherosclerotic calcification of the aorta. No acute osseous pathology. Interval development left chest wall soft tissue emphysema. A vascular stent noted in the left axilla. IMPRESSION: 1. Interval removal of the left chest tube and increase in the size of the left apical pneumothorax compared to the prior radiograph. 2. Interval development of left chest wall soft tissue emphysema. These results were called by telephone at the time of interpretation on 03/02/2020 at 3:27 pm to provider [facility] , who verbally acknowledged these results. Electronically Signed   By: Anner Crete M.D.   On: 03/02/2020 15:36   DG Chest Port 1 View  Result Date: 03/02/2020 CLINICAL DATA:  Pneumothorax. EXAM: PORTABLE CHEST 1 VIEW COMPARISON:  03/01/2020.  CT 02/29/2020. FINDINGS: Left chest tube in stable position. Tiny left apical pneumothorax again noted, improved from prior exam. Previously noted left mid lung mass lesion is again  noted. This is better demonstrated on today's exam compared to the prior study 03/01/2020. Chronic interstitial changes are again noted. Small left pleural effusion again noted. Stable cardiomegaly. Left axillary stent noted. Surgical clips in the neck noted. IMPRESSION: 1. Left chest tube in stable position. Tiny left apical pneumothorax again noted, improved from prior exam. 2. Previously noted left mid lung mass lesion again noted. This is better demonstrated on today's exam compared to prior study 03/01/2020. Chronic interstitial changes are again noted. Small left pleural effusion again noted. 3. Stable cardiomegaly. Electronically Signed   By: Marcello Moores  Register   On: 03/02/2020 07:18   DG CHEST PORT 1 VIEW  Result Date: 03/01/2020 CLINICAL DATA:  Chest tube. EXAM: PORTABLE CHEST 1 VIEW COMPARISON:  02/29/2020.  10/11/2019.  04/13/2019. FINDINGS: Left chest tube in stable position. Stable left apical pneumothorax. Cardiomegaly. No pulmonary venous congestion. Chronic interstitial disease again noted. Small left pleural effusion again noted. Left axillary stents noted. Surgical clips in the neck. IMPRESSION: 1. Left chest tube in stable position. Stable left apical pneumothorax. Small left pleural effusion again noted. 2. Chronic interstitial lung disease again noted. Electronically Signed   By: Marcello Moores  Register   On: 03/01/2020 06:42   DG CHEST PORT 1 VIEW  Result Date: 02/29/2020 CLINICAL DATA:  Follow-up pneumothorax EXAM: PORTABLE CHEST 1 VIEW COMPARISON:  Films from earlier in the same day. FINDINGS: Chest tube is again noted on the left. Tiny left apical pneumothorax is noted but improved when compared with the prior exam. Lungs remain hyperinflated. Soft tissue mass in the midportion of the left chest is again seen. Vascular stents are again noted in the left arm. No bony abnormality is seen. IMPRESSION: Small left apical pneumothorax improved from the prior exam. Chest tube remains in place. Left  mid lung mass is again seen and stable. Electronically Signed   By: Inez Catalina M.D.   On: 02/29/2020 21:37   DG Chest Portable 1 View  Result Date: 02/29/2020 CLINICAL DATA:  Left chest tube placement. EXAM: PORTABLE CHEST 1 VIEW COMPARISON:  Radiograph earlier today. FINDINGS: Placement of left-sided chest tube with tip directed towards the base. Decreased size of left pneumothorax with moderate residual at the apex, pleural line visualized under the posterior fourth rib. Emphysema and left perihilar nodule again seen heart is normal in size. Vascular stent in the left arm.  Bones are under mineralized. IMPRESSION: 1. Placement of left-sided chest tube with decreased size of left pneumothorax, moderate residual at the apex. 2. Emphysema and left perihilar nodule, unchanged. Electronically Signed   By: Keith Rake M.D.   On: 02/29/2020 18:11   DG Chest Port 1 View  Addendum Date: 02/29/2020   ADDENDUM REPORT: 02/29/2020 16:41 ADDENDUM: These results were called by telephone at the time of interpretation on 02/29/2020 at 4:40 pm to provider [facility] , who verbally acknowledged these results. Electronically Signed   By: Fidela Salisbury MD   On: 02/29/2020 16:41   Result Date: 02/29/2020 CLINICAL DATA:  Dyspnea EXAM: PORTABLE CHEST 1 VIEW COMPARISON:  02/28/2020 FINDINGS: Large left pneumothorax is present. There is hyperexpansion of the left hemithorax in keeping with changes of at least mild tension physiology. Underlying COPD again noted with hyperexpansion of the lungs at baseline. Previously noted left mid lung zone mass is not well visualized on this examination. No pneumothorax on the right. No pleural effusion. Cardiac size is within normal limits. Pulmonary vascularity is normal. Surgical clips are seen at the neck base in keeping with changes of probable prior thyroidectomy. Vascular stent noted within the central left upper extremity no acute bone abnormality. IMPRESSION: Large left  pneumothorax, new since prior examination, with some degree of tension physiology. Known left mid lung zone mass not well visualized. COPD Electronically Signed: By: Fidela Salisbury MD On: 02/29/2020 16:30   DG CHEST PORT 1 VIEW  Result Date: 02/28/2020 CLINICAL DATA:  Status post bronchoscopy EXAM: PORTABLE CHEST 1 VIEW COMPARISON:  02/28/2020 FINDINGS: Cardiac shadow is stable. Left-sided mid chest mass is again identified and stable. No post bronchoscopy pneumothorax is seen. Vascular stent on the left is noted. The lungs are otherwise clear. IMPRESSION: No evidence of post bronchoscopy pneumothorax. Stable left mid chest mass. Electronically Signed   By: Inez Catalina M.D.   On: 02/28/2020 17:40   CT Super D Chest Wo Contrast  Result Date: 02/17/2020 CLINICAL DATA:  Left upper lobe lung mass preop for EBUS. EXAM: CT CHEST WITHOUT CONTRAST TECHNIQUE: Multidetector CT imaging of the chest was performed using thin slice collimation for electromagnetic bronchoscopy planning purposes, without intravenous contrast. COMPARISON:  Chest CT 11/18/2019 FINDINGS: Cardiovascular: The heart is normal in size. No pericardial effusion. There is mild tortuosity and moderate calcification of the thoracic aorta which is stable. Enlarged pulmonary artery suggesting pulmonary hypertension. Mediastinum/Nodes: Small scattered mediastinal and hilar lymph nodes appears stable. No obvious adenopathy on this noncontrast examination. The esophagus is grossly normal. Lungs/Pleura: Stable severe emphysematous changes and hit stable areas of pulmonary scarring. The left upper lobe lung mass measures 3.3 x 3.0 cm and previously measured 2.2 x 2.0 cm. More superiorly in the left upper lobe there is a stable 6 mm nodule. Subpleural nodular lesion in the left upper lobe on image 73/4 measures 6 mm and appears stable. Upper Abdomen: No significant upper abdominal findings. Musculoskeletal: No significant bony findings. IMPRESSION: 1.  Interval enlargement of the left upper lobe lung mass, now measuring 3.3 x 3.0 cm. 2. Stable 6 mm left upper lobe pulmonary nodules. 3. Stable severe emphysematous changes and pulmonary scarring. 4. Enlarged pulmonary artery suggesting pulmonary hypertension. 5. Stable atherosclerotic calcifications involving the thoracic aorta and branch vessels. 6. Emphysema and aortic atherosclerosis. Aortic Atherosclerosis (ICD10-I70.0) and Emphysema (ICD10-J43.9). Electronically Signed   By: Marijo Sanes M.D.   On: 02/17/2020 18:44   DG C-ARM BRONCHOSCOPY  Result Date: 02/28/2020 C-ARM BRONCHOSCOPY:  Fluoroscopy was utilized by the requesting physician.  No radiographic interpretation.    Microbiology Recent Results (from the past 240 hour(s))  Respiratory Panel by RT PCR (Flu A&B, Covid) - Nasopharyngeal Swab     Status: None   Collection Time: 03/19/2020  2:14 PM   Specimen: Nasopharyngeal Swab  Result Value Ref Range Status   SARS Coronavirus 2 by RT PCR NEGATIVE NEGATIVE Final    Comment: (NOTE) SARS-CoV-2 target nucleic acids are NOT DETECTED.  The SARS-CoV-2 RNA is generally detectable in upper respiratoy specimens during the acute phase of infection. The lowest concentration of SARS-CoV-2 viral copies this assay can detect is 131 copies/mL. A negative result does not preclude SARS-Cov-2 infection and should not be used as the sole basis for treatment or other patient management decisions. A negative result may occur with  improper specimen collection/handling, submission of specimen other than nasopharyngeal swab, presence of viral mutation(s) within the areas targeted by this assay, and inadequate number of viral copies (<131 copies/mL). A negative result must be combined with clinical observations, patient history, and epidemiological information. The expected result is Negative.  Fact Sheet for Patients:  PinkCheek.be  Fact Sheet for Healthcare Providers:   GravelBags.it  This test is no t yet approved or cleared by the [place] FDA and  has been authorized for detection and/or diagnosis of SARS-CoV-2 by FDA under an Emergency Use Authorization (EUA). This EUA will remain  in effect (meaning this test can be used) for the duration of the COVID-19 declaration under Section 564(b)(1) of the Act, 21 U.S.C. section 360bbb-3(b)(1), unless the authorization is terminated or revoked sooner.     Influenza A by PCR NEGATIVE NEGATIVE Final   Influenza B by PCR NEGATIVE NEGATIVE Final    Comment: (NOTE) The Xpert Xpress SARS-CoV-2/FLU/RSV assay is intended as an aid in  the diagnosis of influenza from Nasopharyngeal swab specimens and  should not be used as a sole basis for treatment. Nasal washings and  aspirates are unacceptable for Xpert Xpress SARS-CoV-2/FLU/RSV  testing.  Fact Sheet for Patients: PinkCheek.be  Fact Sheet for Healthcare Providers: GravelBags.it  This test is not yet approved or cleared by the [place] FDA and  has been authorized for detection and/or diagnosis of SARS-CoV-2 by  FDA under an Emergency Use Authorization (EUA). This EUA will remain  in effect (meaning this test can be used) for the duration of the  Covid-19 declaration under Section 564(b)(1) of the Act, 21  U.S.C. section 360bbb-3(b)(1), unless the authorization is  terminated or revoked. Performed at [facility], [place] [address] [place], [place] [address]   Culture, blood (routine x 2) Call MD if unable to obtain prior to antibiotics being given     Status: None   Collection Time: 03/17/2020  6:27 PM   Specimen: BLOOD RIGHT FOREARM  Result Value Ref Range Status   Specimen Description BLOOD RIGHT FOREARM  Final   Special Requests   Final    BOTTLES DRAWN AEROBIC AND ANAEROBIC Blood Culture results may not be optimal due to an inadequate volume of blood  received in culture bottles   Culture   Final    NO GROWTH 5 DAYS Performed at [facility], [place] [address] [place], [place] [address]    Report Status 03/12/2020 FINAL  Final    Lab Basic Metabolic Panel: Recent Labs  Lab 03/01/2020 0929 03/06/2020 1339 03/12/2020 1343 03/08/20 0255  NA 140  --  138 141  K 4.1  --  4.0 5.0  CL 100  --   --  104  CO2 22  --   --  18*  GLUCOSE 89  --   --  64*  BUN 71*  --   --  86*  CREATININE 7.03*  --   --  7.95*  CALCIUM 8.2*  --   --  7.6*  MG  --  1.5*  --   --   PHOS  --  5.3*  --   --    Liver Function Tests: Recent Labs  Lab 03/08/20 0255  AST 27  ALT 20  ALKPHOS 62  BILITOT 0.9  PROT 4.6*  ALBUMIN 1.7*   No results for input(s): LIPASE, AMYLASE in the last 168 hours. No results for input(s): AMMONIA in the last 168 hours. CBC: Recent Labs  Lab 03/05/2020 0929 03/19/2020 1343 03/08/20 0255 03/08/20 2015  WBC 11.5*  --  13.4*  --   NEUTROABS 9.8*  --  11.2*  --   HGB 8.5* 7.1* 7.0* 8.7*  HCT 28.3* 21.0* 22.8* 27.0*  MCV 100.0  --  100.4*  --   PLT 90*  --  64*  --    Cardiac Enzymes: No results for input(s): CKTOTAL, CKMB, CKMBINDEX, TROPONINI in the last 168 hours. Sepsis Labs: Recent Labs  Lab 03/05/2020 0929 03/08/20 0255  WBC 11.5* 13.4*    Procedures/Operations     Clementine Soulliere 23-Mar-2020, 3:13 PM

## 2020-03-26 DEATH — deceased

## 2022-09-26 IMAGING — MR MR HEAD WO/W CM
7 of 11 series · 20 of 48 positions shown · IV contrast (gadavist)
Comparison: MRI 02/15/2020

CLINICAL DATA: Metastatic disease. Staging. Patient with known
diagnosis of lung cancer.

EXAM:
MRI HEAD WITHOUT AND WITH CONTRAST
TECHNIQUE: Multiplanar, multiecho pulse sequences of the brain and surrounding
structures were obtained without and with intravenous contrast.
CONTRAST:  5mL GADAVIST GADOBUTROL 1 MMOL/ML IV SOLN

[Series 2: FLAIR · sagittal · 3.0mm · 0.47mm/px · 3 of 36 slices shown (1 of 2)]
[im 1/36]
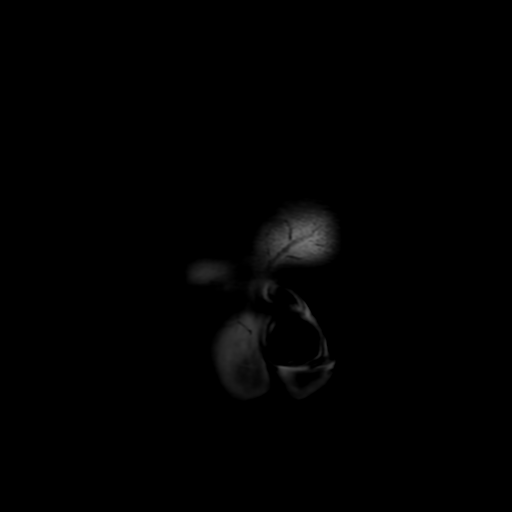
[im 18/36]
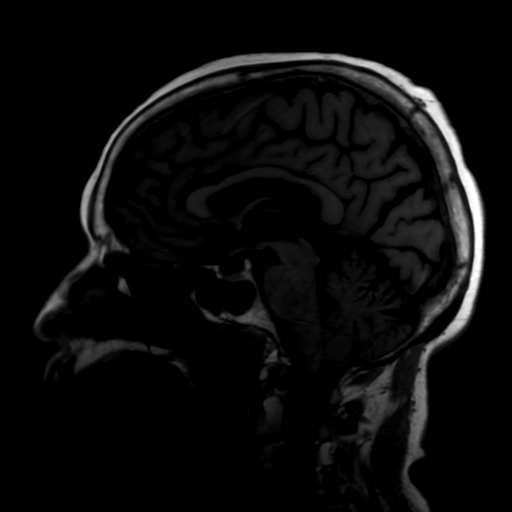
[im 36/36]
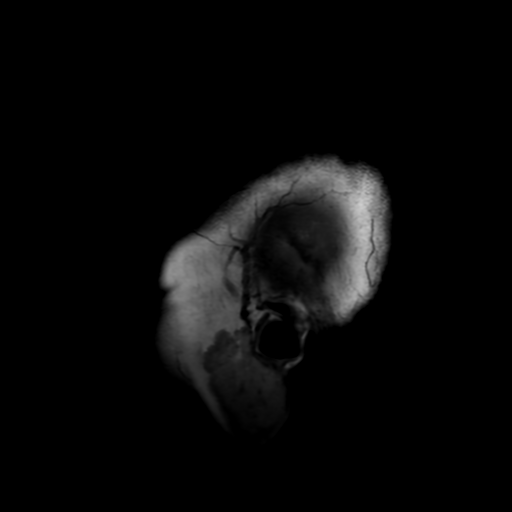

[Series 3: DWI · axial · 3.0mm · 0.94mm/px · z∈[-56,+114]mm · 6 of 116 slices shown]
[im 1/116]
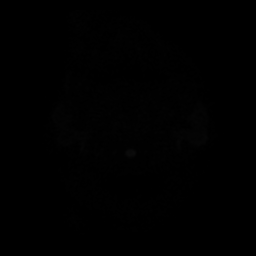
[im 24/116]
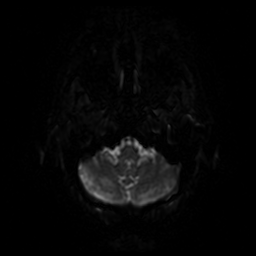
[im 47/116]
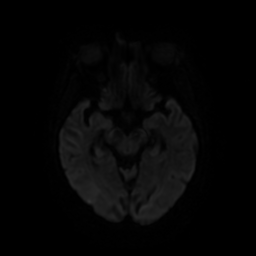
[im 70/116]
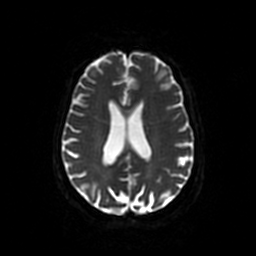
[im 93/116]
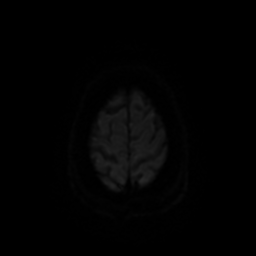
[im 116/116]
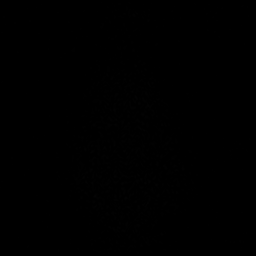

[Series 4: T2 · axial · 5.0mm · 0.23mm/px · 1 of 26 slices shown]
[im 1/26]
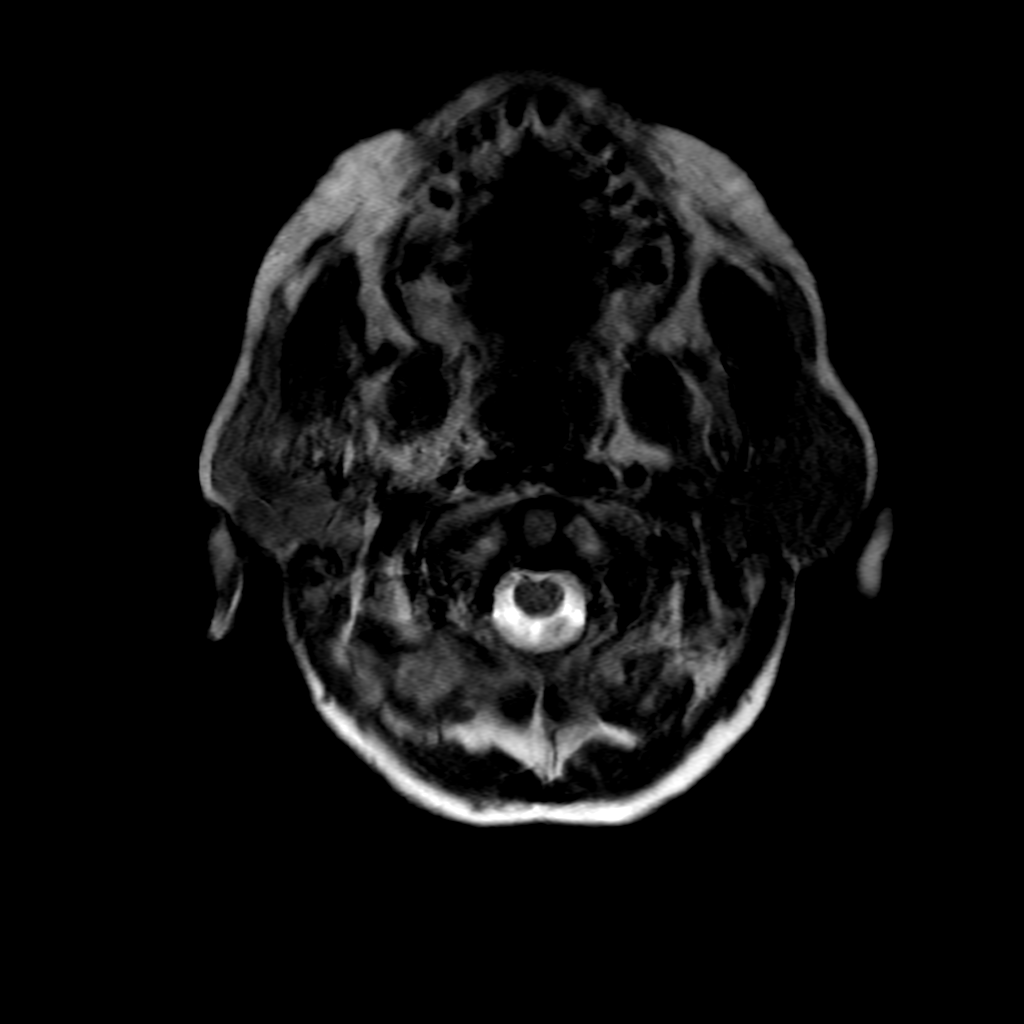

[Series 5: FLAIR · axial · 3.0mm · 0.47mm/px · z∈[-68,+103]mm · 3 of 58 slices shown (2 of 2)]
[im 1/58]
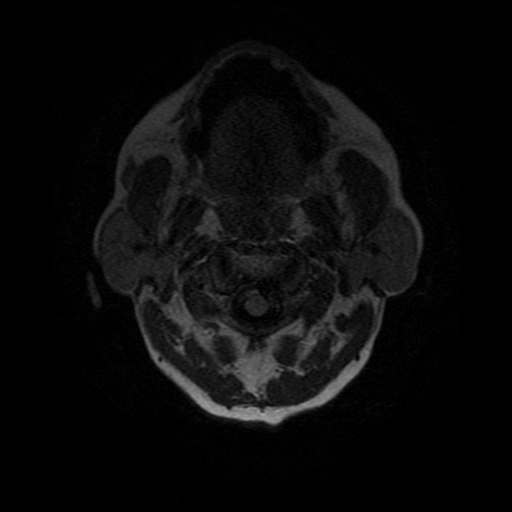
[im 29/58]
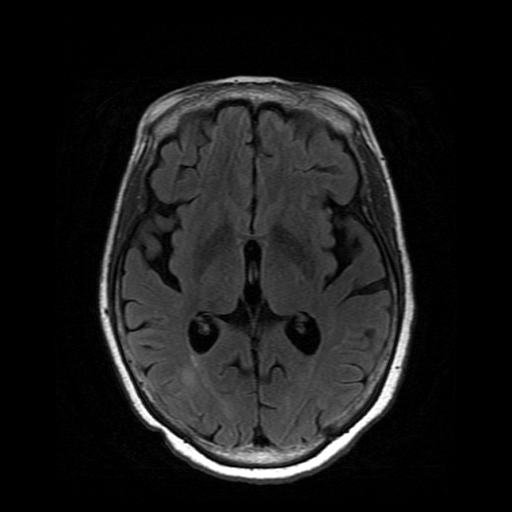
[im 58/58]
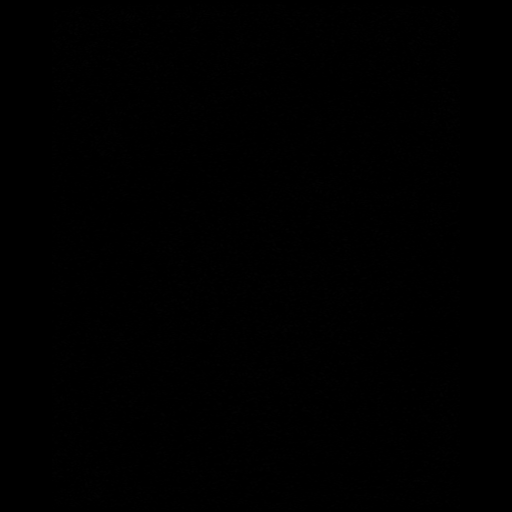

[Series 10: T1 post-contrast · coronal · 3.0mm · 0.43mm/px · 2 of 45 slices shown]
[im 1/45]
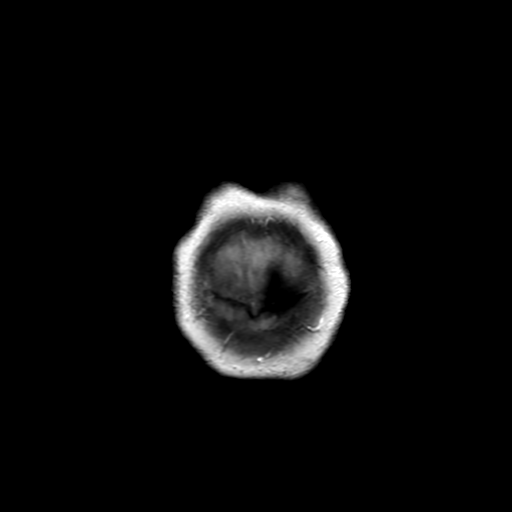
[im 45/45]
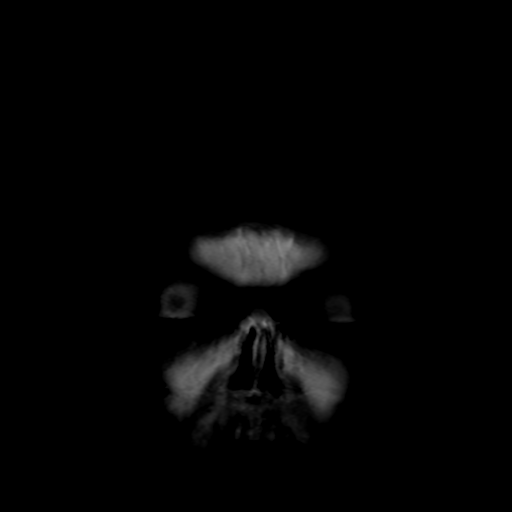

[Series 11: FLAIR post-contrast · sagittal · 3.0mm · 0.47mm/px · 2 of 36 slices shown]
[im 1/36]
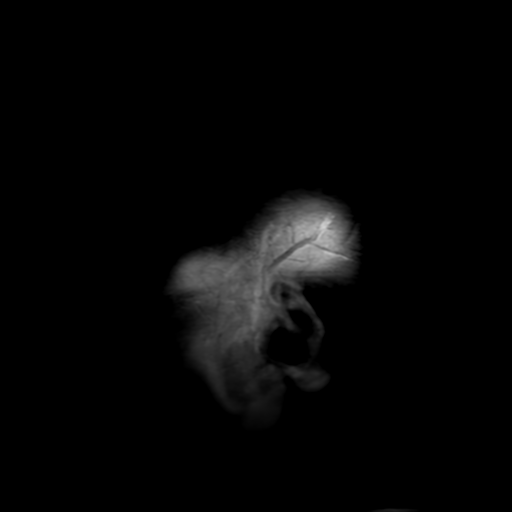
[im 36/36]
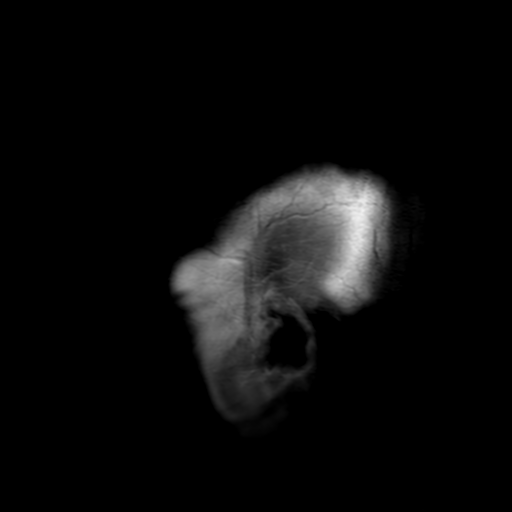

[Series 350: ADC · axial · 3.0mm · 0.94mm/px · z∈[-56,+114]mm · 3 of 58 slices shown]
[im 1/58]
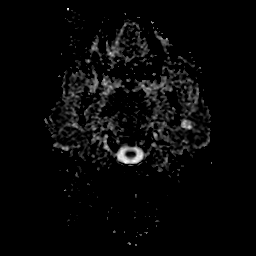
[im 29/58]
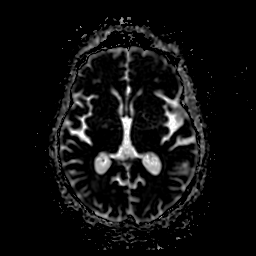
[im 58/58]
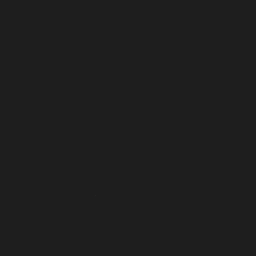

[20 of 48 positions shown; findings below may reference images not displayed]

FINDINGS: Brain:

Multiple peripherally enhancing metastatic lesions on series 9,
including:

*6 mm lesion in the left frontal lobe (image 21).
*6 mm lesion in the high left parietal lobe (image 118).
*2 mm lesion in the high left frontal lobe (image 112).
*6 mm lesion in the right parieto-occipital region (image 95).
*Punctate lesion in the posterior left temporal lobe (image 84).
*7 mm lesion in the posterior right temporal lobe (image 76) with
additional tiny adjacent lesion (image 72).
*Approximately 7 mm lesion in the anterior right temporal lobe
(image 51) with limited evaluation given motion.
*5 mm lesion in the right cerebellar hemisphere (image 35)
*4 mm lesion in the cerebellar vermis (image 56) with likely
additional punctate additional lesion (image 60).

These lesions have associated mild surrounding vasogenic edema
without substantial mass effect. No midline shift. Basal cisterns
are patent. Some of these lesions demonstrate mild susceptibility
artifact, compatible with internal hemorrhage. Otherwise, no acute
hemorrhage. No acute infarct. Mild cerebral atrophy with ex vacuo
ventricular dilation. No hydrocephalus.

Vascular: Major arterial flow voids are maintained at the skull
base.

Skull and upper cervical spine: Normal marrow signal.

Sinuses/Orbits: Negative.

Other: No mastoid effusion.
IMPRESSION: Numerous infratentorial and supratentorial enhancing metastatic
lesions, as detailed above. Mild associated vasogenic edema without
substantial mass effect.
